# Patient Record
Sex: Male | Born: 1985
Health system: Southern US, Community
[De-identification: ages and names within clinical notes are randomized; demographics above are authoritative.]

## PROBLEM LIST (undated history)

## (undated) DIAGNOSIS — D849 Immunodeficiency, unspecified: Secondary | ICD-10-CM

## (undated) DIAGNOSIS — E119 Type 2 diabetes mellitus without complications: Secondary | ICD-10-CM

## (undated) DIAGNOSIS — I10 Essential (primary) hypertension: Secondary | ICD-10-CM

## (undated) DIAGNOSIS — I639 Cerebral infarction, unspecified: Secondary | ICD-10-CM

## (undated) DIAGNOSIS — Z941 Heart transplant status: Secondary | ICD-10-CM

## (undated) DIAGNOSIS — E785 Hyperlipidemia, unspecified: Secondary | ICD-10-CM

## (undated) DIAGNOSIS — G629 Polyneuropathy, unspecified: Secondary | ICD-10-CM

## (undated) DIAGNOSIS — R569 Unspecified convulsions: Secondary | ICD-10-CM

## (undated) DIAGNOSIS — I428 Other cardiomyopathies: Secondary | ICD-10-CM

## (undated) DIAGNOSIS — I619 Nontraumatic intracerebral hemorrhage, unspecified: Secondary | ICD-10-CM

## (undated) DIAGNOSIS — I5022 Chronic systolic (congestive) heart failure: Secondary | ICD-10-CM

## (undated) HISTORY — PX: OTHER SURGICAL HISTORY: SHX169

---

## 2004-05-15 ENCOUNTER — Emergency Department (HOSPITAL_COMMUNITY): Admission: EM | Admit: 2004-05-15 | Discharge: 2004-05-15 | Payer: Self-pay | Admitting: Emergency Medicine

## 2005-11-13 ENCOUNTER — Observation Stay (HOSPITAL_COMMUNITY): Admission: EM | Admit: 2005-11-13 | Discharge: 2005-11-14 | Payer: Self-pay | Admitting: Emergency Medicine

## 2005-11-13 ENCOUNTER — Ambulatory Visit: Payer: Self-pay | Admitting: Cardiovascular Disease

## 2005-11-25 ENCOUNTER — Ambulatory Visit: Payer: Self-pay | Admitting: Cardiovascular Disease

## 2005-11-25 ENCOUNTER — Encounter: Payer: Self-pay | Admitting: Cardiology

## 2005-11-25 ENCOUNTER — Ambulatory Visit: Payer: Self-pay

## 2006-04-03 ENCOUNTER — Ambulatory Visit: Payer: Self-pay | Admitting: Cardiovascular Disease

## 2006-04-17 ENCOUNTER — Ambulatory Visit (HOSPITAL_COMMUNITY): Admission: RE | Admit: 2006-04-17 | Discharge: 2006-04-17 | Payer: Self-pay | Admitting: Cardiovascular Disease

## 2006-04-17 ENCOUNTER — Ambulatory Visit: Payer: Self-pay | Admitting: Cardiovascular Disease

## 2006-05-02 ENCOUNTER — Ambulatory Visit: Payer: Self-pay | Admitting: Cardiovascular Disease

## 2009-11-12 ENCOUNTER — Emergency Department (HOSPITAL_COMMUNITY): Admission: EM | Admit: 2009-11-12 | Discharge: 2009-11-12 | Payer: Self-pay | Admitting: Emergency Medicine

## 2009-11-15 ENCOUNTER — Encounter (INDEPENDENT_AMBULATORY_CARE_PROVIDER_SITE_OTHER): Payer: Self-pay | Admitting: Internal Medicine

## 2009-11-15 ENCOUNTER — Inpatient Hospital Stay (HOSPITAL_COMMUNITY): Admission: EM | Admit: 2009-11-15 | Discharge: 2009-11-19 | Payer: Self-pay | Admitting: Emergency Medicine

## 2009-11-15 ENCOUNTER — Ambulatory Visit: Payer: Self-pay | Admitting: Surgery

## 2009-11-15 ENCOUNTER — Ambulatory Visit: Payer: Self-pay | Admitting: Cardiovascular Disease

## 2009-11-16 ENCOUNTER — Ambulatory Visit: Payer: Self-pay | Admitting: Cardiovascular Disease

## 2009-11-19 ENCOUNTER — Encounter: Payer: Self-pay | Admitting: Cardiovascular Disease

## 2009-12-07 ENCOUNTER — Ambulatory Visit: Payer: Self-pay | Admitting: Family Medicine

## 2009-12-27 ENCOUNTER — Emergency Department (HOSPITAL_COMMUNITY): Admission: EM | Admit: 2009-12-27 | Discharge: 2009-12-27 | Payer: Self-pay | Admitting: Emergency Medicine

## 2010-01-16 ENCOUNTER — Encounter (INDEPENDENT_AMBULATORY_CARE_PROVIDER_SITE_OTHER): Payer: Self-pay | Admitting: Family Medicine

## 2010-01-16 LAB — CONVERTED CEMR LAB
Calcium: 9.4 mg/dL (ref 8.4–10.5)
Cholesterol: 287 mg/dL — ABNORMAL HIGH (ref 0–200)
HDL: 38 mg/dL — ABNORMAL LOW (ref >39)
Potassium: 4.3 meq/L (ref 3.5–5.3)
Sodium: 141 meq/L (ref 135–145)
Total CHOL/HDL Ratio: 7.6
Triglycerides: 141 mg/dL (ref <150)
VLDL: 28 mg/dL (ref 0–40)

## 2010-01-30 ENCOUNTER — Ambulatory Visit: Payer: Self-pay | Admitting: Family Medicine

## 2010-05-05 ENCOUNTER — Encounter: Payer: Self-pay | Admitting: Cardiovascular Disease

## 2010-05-15 NOTE — Letter (Signed)
Summary: [facility]  [facility]   Imported By: Marylou Mccoy 11/30/2009 14:09:15  _____________________________________________________________________  External Attachment:    Type:   Image     Comment:   External Document

## 2010-06-29 LAB — BASIC METABOLIC PANEL
BUN: 8 mg/dL (ref 6–23)
BUN: 8 mg/dL (ref 6–23)
CO2: 26 mEq/L (ref 19–32)
CO2: 28 mEq/L (ref 19–32)
Calcium: 8.6 mg/dL (ref 8.4–10.5)
Chloride: 101 mEq/L (ref 96–112)
Chloride: 102 mEq/L (ref 96–112)
Chloride: 105 mEq/L (ref 96–112)
Creatinine, Ser: 0.81 mg/dL (ref 0.4–1.5)
Creatinine, Ser: 0.84 mg/dL (ref 0.4–1.5)
GFR calc Af Amer: >60 mL/min (ref >60)
GFR calc Af Amer: >60 mL/min (ref >60)
GFR calc Af Amer: >60 mL/min (ref >60)
GFR calc non Af Amer: >60 mL/min (ref >60)
GFR calc non Af Amer: >60 mL/min (ref >60)
Glucose, Bld: 226 mg/dL — ABNORMAL HIGH (ref 70–99)
Glucose, Bld: 346 mg/dL — ABNORMAL HIGH (ref 70–99)
Potassium: 3.3 mEq/L — ABNORMAL LOW (ref 3.5–5.1)
Potassium: 3.6 mEq/L (ref 3.5–5.1)
Potassium: 3.7 mEq/L (ref 3.5–5.1)
Potassium: 4 mEq/L (ref 3.5–5.1)
Sodium: 137 mEq/L (ref 135–145)
Sodium: 137 mEq/L (ref 135–145)
Sodium: 138 mEq/L (ref 135–145)
Sodium: 145 mEq/L (ref 135–145)

## 2010-06-29 LAB — GLUCOSE, CAPILLARY
Glucose-Capillary: 143 mg/dL — ABNORMAL HIGH (ref 70–99)
Glucose-Capillary: 196 mg/dL — ABNORMAL HIGH (ref 70–99)
Glucose-Capillary: 205 mg/dL — ABNORMAL HIGH (ref 70–99)
Glucose-Capillary: 232 mg/dL — ABNORMAL HIGH (ref 70–99)
Glucose-Capillary: 239 mg/dL — ABNORMAL HIGH (ref 70–99)
Glucose-Capillary: 268 mg/dL — ABNORMAL HIGH (ref 70–99)
Glucose-Capillary: 288 mg/dL — ABNORMAL HIGH (ref 70–99)
Glucose-Capillary: 291 mg/dL — ABNORMAL HIGH (ref 70–99)
Glucose-Capillary: 323 mg/dL — ABNORMAL HIGH (ref 70–99)
Glucose-Capillary: 337 mg/dL — ABNORMAL HIGH (ref 70–99)
Glucose-Capillary: 338 mg/dL — ABNORMAL HIGH (ref 70–99)
Glucose-Capillary: 343 mg/dL — ABNORMAL HIGH (ref 70–99)
Glucose-Capillary: 398 mg/dL — ABNORMAL HIGH (ref 70–99)

## 2010-06-29 LAB — CARDIAC PANEL(CRET KIN+CKTOT+MB+TROPI)
CK, MB: 2.1 ng/mL (ref 0.3–4.0)
CK, MB: 2.2 ng/mL (ref 0.3–4.0)
Total CK: 370 U/L — ABNORMAL HIGH (ref 7–232)
Troponin I: 0.02 ng/mL (ref 0.00–0.06)

## 2010-06-29 LAB — DIFFERENTIAL
Basophils Absolute: 0 10*3/uL (ref 0.0–0.1)
Eosinophils Absolute: 0.1 10*3/uL (ref 0.0–0.7)
Eosinophils Relative: 1 % (ref 0–5)
Lymphocytes Relative: 47 % — ABNORMAL HIGH (ref 12–46)
Monocytes Absolute: 0.4 10*3/uL (ref 0.1–1.0)

## 2010-06-29 LAB — URINALYSIS, ROUTINE W REFLEX MICROSCOPIC
Bilirubin Urine: NEGATIVE
Glucose, UA: >1000 mg/dL — AB
Ketones, ur: 15 mg/dL — AB
Nitrite: NEGATIVE
Nitrite: NEGATIVE
Protein, ur: NEGATIVE mg/dL
Protein, ur: NEGATIVE mg/dL
Specific Gravity, Urine: 1.043 — ABNORMAL HIGH (ref 1.005–1.030)
Urobilinogen, UA: 0.2 mg/dL (ref 0.0–1.0)
pH: 5 (ref 5.0–8.0)

## 2010-06-29 LAB — URINE MICROSCOPIC-ADD ON

## 2010-06-29 LAB — LIPID PANEL
HDL: 49 mg/dL (ref >39)
Triglycerides: 749 mg/dL — ABNORMAL HIGH (ref <150)
VLDL: UNDETERMINED mg/dL (ref 0–40)

## 2010-06-29 LAB — CBC
HCT: 42.2 % (ref 39.0–52.0)
HCT: 45.1 % (ref 39.0–52.0)
Hemoglobin: 14.7 g/dL (ref 13.0–17.0)
MCH: 29 pg (ref 26.0–34.0)
MCH: 29.7 pg (ref 26.0–34.0)
MCHC: 34.8 g/dL (ref 30.0–36.0)
MCHC: 35.9 g/dL (ref 30.0–36.0)
MCV: 82.8 fL (ref 78.0–100.0)
MCV: 83.2 fL (ref 78.0–100.0)
Platelets: 186 10*3/uL (ref 150–400)
RDW: 11.6 % (ref 11.5–15.5)
WBC: 5.5 10*3/uL (ref 4.0–10.5)

## 2010-06-29 LAB — HEMOGLOBIN A1C: Mean Plasma Glucose: 298 mg/dL — ABNORMAL HIGH (ref <117)

## 2010-06-29 LAB — POCT I-STAT, CHEM 8
BUN: 19 mg/dL (ref 6–23)
Calcium, Ion: 1.14 mmol/L (ref 1.12–1.32)
Hemoglobin: 16.3 g/dL (ref 13.0–17.0)
TCO2: 25 mmol/L (ref 0–100)

## 2010-06-29 LAB — RAPID URINE DRUG SCREEN, HOSP PERFORMED
Benzodiazepines: NOT DETECTED
Cocaine: NOT DETECTED
Tetrahydrocannabinol: NOT DETECTED

## 2010-06-29 LAB — C-PEPTIDE: C-Peptide: 1.85 ng/mL (ref 0.80–3.90)

## 2010-06-29 LAB — GLUCOSE, RANDOM: Glucose, Bld: 373 mg/dL — ABNORMAL HIGH (ref 70–99)

## 2010-08-31 NOTE — H&P (Signed)
Jason Stevenson, Jason NO.:  [id number]   MEDICAL RECORD NO.:  [id number]          PATIENT TYPE:  EMS   LOCATION:  MAJO                         FACILITY:  [facility]   PHYSICIAN:  Charlton Haws, M.D.     DATE OF BIRTH:  1985-10-14   DATE OF ADMISSION:  11/13/2005  DATE OF DISCHARGE:                                HISTORY & PHYSICAL   PRIMARY CARDIOLOGIST:  He is new and being seen by Dr. Eden Emms.   PRIMARY CARE PHYSICIAN:  He does not have one.   PATIENT PROFILE:  A 25 year old African-American male who presents with  chest pain and EKG changes.   PROBLEM LIST:  1.  Chest pain.  2.  Abnormal ECG.  3.  Hyperlipidemia, diagnosed at age 27.  4.  Hypertension, diagnosed at age 37.  5.  Medical nonadherence.   HISTORY OF PRESENT ILLNESS:  A 25 year old African-American male with no  prior of CAD, who was in his usual state of health until approximately 7:00  p.m. last night when he woke up from napping with 4/10 substernal chest  pressure and grabbing sensation without associated symptoms.  The symptoms  persisted throughout the night and all day today.  There are no aggravating  or alleviating factors.  Due to the persistence of symptoms, presented to  the ED this evening.  An ECG was performed revealing anterior ST elevation  as well as inferolateral ST segment depression and T wave inversion.  A code  STEMI was initially called; however, upon further review of the patient's  old EKG in January, 2006, it was determined that his ECG now is unchanged  from previous.   He continues to complain of 4/10 chest pain but otherwise appears  comfortable.  He denies any PND, orthopnea, dizziness, syncope, edema, or  early satiety.   ALLERGIES:  No known drug allergies.   HOME MEDICATIONS:  None, although he reports that he was prescribed Lipitor  and a blood pressure medicine at age 10.   FAMILY HISTORY:  Mother is alive at age 50 with diabetes.  He does not know  much  about his father's history.  He has a sister who had a CVA at age 54.   SOCIAL HISTORY:  He lives in [place] by himself.  He works [employer].  He denies any tobacco, alcohol or drugs.  He does not  routinely exercise.   REVIEW OF SYSTEMS:  Positive for chest pain, as outlined in the HPI.  All  other systems are reviewed and are negative.   PHYSICAL EXAMINATION:  VITAL SIGNS:  He is afebrile.  Heart rate 65,  respirations 16, blood pressure 147/84.  Pulse ox 100% on 2 liters per  minute.  GENERAL:  A pleasant African-American male in no acute distress.  Acute  onset of x3.  NECK:  Normal carotid upstrokes.  No bruits or JVD.  LUNGS:  Respirations are regular and unlabored.  Clear to auscultation.  CARDIAC:  Regular S1 and S2.  No S3, S4, or murmurs.  ABDOMEN:  Round, soft, nontender, nondistended.  Bowel sounds present x4.  EXTREMITIES:  Warm, dry, pink.  No clubbing, cyanosis or edema.  Dorsalis  pedis and posterior tibial pulses are 2+ and equal bilaterally.   Chest x-ray is pending.   EKG shows sinus rhythm with anterior 1-2 mm ST segment elevation,  specifically in V1-3 with ST depression and T wave inversion in leads II,  III, aVF, and V4-6.   Lab work is pending.   ASSESSMENT/PLAN:  1.  Chest pain:  He has an abnormal electrocardiogram but this is unchanged      compared to previous in January, 2006.  We will plan to admit, cycle      cardiac markers, and call off the code STEMI.  If he rules out, probable      cardiac CT tomorrow afternoon.  We will also obtain electrocardiogram to      evaluate wall motion, rule out valve disease or abnormality.  2.  History of hypertension and hyperlipidemia:  Continue to follow blood      pressures, which is currently elevated, although it was lower      previously.  Check lipids and LFTs.      Ok Anis, NP    ______________________________  Charlton Haws, M.D.    CRB/MEDQ  D:  11/13/2005  T:   11/13/2005  Job:  [id number]

## 2010-08-31 NOTE — Assessment & Plan Note (Signed)
[facility]                            CARDIOLOGY OFFICE NOTE   [name], [name]                    MRN:          [id number]  DATE:05/02/2006                            DOB:          07/03/1985    HISTORY OF PRESENT ILLNESS:  Jason Stevenson returns for follow-up. I initially  saw him in the hospital with atypical chest pain. He was found to have a  cardiomyopathy.   His MRI showed that he has a nonischemic cardiomyopathy with no evidence  of scar tissue. His EF was calculated at 45%.   However visually it appeared a little bit lower than that. I had a long  discussion with Jason Stevenson today about compliance. He still seems a little  bit evasive. Initially when I met him he was going to school and  working. Now he is not working and he is a little bit vague about what  he is doing at school. Fortunately he has not had any significant PND,  orthopnea. There has been no evidence of syncope and he is able to sleep  through the night without shortness of breath. There is no family  history of cardiomyopathy.   I explained to him previously that he needed to watch his salt in his  diet. Currently we only have him on a baby aspirin and Lisinopril 10 mg  a day and with this his blood pressure is fine at 120/70, his pulse is  68 and regular.  HEENT: Normal.  NECK: There is no lymphadenopathy, no thyromegaly.  LUNGS: Clear.  HEART: There is an S1, S2 with a very short systolic murmur.  ABDOMEN: Benign.  LOWER EXTREMITIES:  Intact pulses, no edema.   IMPRESSION:  1. Blood pressure controlled on Lisinopril.  2. Cardiomyopathy, nonischemic.   The patient has some difficulty affording his medications. We have him  on generic Lisinopril 10 a day. I do not think he needs to be on Coreg  or multi-drug therapy at this time. He is functional class I.  I would  like to follow-up his EF in about a year with an echo. He will call me  if he has any new symptoms.   I  told him that his current level of function he should not have any  physical limitations. His EF is not low enough to require EP  consultation. He is currently not volume overloaded and I will see him  back in 6 months. At that time we will check a BMET and a BNP since he  is on an ACE inhibitor with decreased LV function.    Jason Stevenson. Jason Emms, MD, [facility]  Electronically Signed   PCN/MedQ  DD: 05/02/2006  DT: 05/02/2006  Job #: [id number]

## 2010-08-31 NOTE — Assessment & Plan Note (Signed)
[facility]                            CARDIOLOGY OFFICE NOTE   Jason Stevenson, Jason Stevenson                    MRN:          [id number]  DATE:04/03/2006                            DOB:          1985/09/11    [name] returns today for followup.  He has been seen at the  hospital before for atypical chest pain.   He had a cardiac CT which was somewhat suboptimal, but did not suggest  coronary disease with a negative calcium score.  His echocardiogram done  August 13 showed global hypokinesis with an EF of 40%.  When we  initially saw the patient he had CPKs of 1000 with an MB of 12.5 and a  troponin of 0.05.   I thought these were noncardiac in nature.  The patient continues to  have atypical chest pain and sharp stabbing pains in his chest.  There  has been no evidence of myocarditis or pericarditis.  He was supposed to  start Lisinopril 10 mg a day and never did.  He was supposed to show up  at the hospital for a cardiac MRI to assess this quantitatively, assess  his LV function and rule out myocarditis or scar tissue, as well as a  possible infiltrative cardiomyopathy and he did not show up.  I talked  to [place] frankly, I am not quite sure what to make of him.  He is  supposed to be going to school at [employer] and also  working at [employer].  His main agenda  today in the office was to get a note for missing work.  I told him I  could not give him this since I did not know why he missed work the last  4 days.  In regards to his symptoms, he is not having shortness of  breath, syncope, PND, or orthopnea, he is having some atypical chest  pain.   He is supposed to be on Prilosec OTC, baby aspirin a day, and Lisinopril  10 a day.   Today the blood pressure is 140/88.  HEENT:  Is normal.  Carotids are normal.  LUNGS:  Are clear.  There is an S1, S2 with normal heart sounds.  ABDOMEN:  Is  benign.  LOWER EXTREMITIES:  Intact pulse, no edema.  NEURO:  Is nonfocal.  SKIN:  Is warm and dry.   IMPRESSION:  Probable cardiomyopathy of undefined etiology.  Followup  cardiac MRI to quantitatively assess ejection fraction and rule out  infiltrative cardiomyopathy or myocarditis.  Atypical chest pain.  Continue with nonsteroidal treatment, blood pressure control and after-  load reduction with Lisinopril 10 mg a day.  I did gave him samples of  Altace today in case he did not want to get his prescription filled.   I will see him back in about 4 weeks to assess his compliance, review  his MRI and see if he is continuing to have chest pain.   I frankly told [name] that if he was unwilling to keep his appointments  for diagnostic tests or take his medicine it would  be difficult to care  for him in the future.     Noralyn Pick. Eden Emms, MD, [facility]  Electronically Signed    PCN/MedQ  DD: 04/03/2006  DT: 04/04/2006  Job #: [id number]

## 2010-08-31 NOTE — Assessment & Plan Note (Signed)
[facility]                              CARDIOLOGY OFFICE NOTE   LENNIN, OSMOND                    MRN:          [id number]  DATE:11/25/2005                            DOB:          1985-05-28    Mr. Kathan is seen today in followup.  He was in the hospital for 24  hours observation on November 13, 2005.  He had chest pain.  He has abnormal  EKG at baseline.  We had an old EKG from over a year ago which was  identical.  He had diffuse T-wave inversions and an incomplete right bundle  branch block.  There was J-point elevation in the anterior leads.  His CPKs  were elevated, but his MBs were less than 2%.  His troponins were negative  at .05 and .07.   It was not clear as to why he had a non-cardiac elevation in his CPK.  There  were no acute EKG changes despite his baseline abnormality.  His pain was  atypical in nature and non-exertional.  It responded to Motrin and pain  medicines more.  However, today he showed up in our office for followup.  He  was to have a stress echo.  His baseline images which I have not reviewed  yet showed an EF of 35% per the echo tech.  I told the tech to bring him  over to the exam room.  In talking to the patient, he really does not have  any significant pains.  He has musculoskeletal pains in his shoulders.   He is not experiencing shortness of breath, PND, orthopnea or syncope.   He does not know his father's side of the family very well, but there is no  family history of cardiomyopathy.  There is a questionable history of  hypertension for four years that is untreated.   EXAMINATION:  VITAL SIGNS:  Blood pressure is 140/88, pulse is 70 and  regular.  LUNGS:  Clear.  NECK:  Carotids are normal.  CARDIOVASCULAR:  There is an S1, S2.  I do not hear a rub, murmur, gallop or  click.  ABDOMEN:  Benign.  EXTREMITIES:  Intact pulse, no edema.   IMPRESSION:  The patient appears to have a cardiomyopathy.   I do not think  it is ischemic in nature; however, we will start on the lisinopril 3 mg a  day.  He had a cardiac CT in the hospital, which despite using care bolus  with a region of interest in the aorta, the cardiac CT was poorly timed, and  essentially was a pulmonary angiogram.  What images we could see of the  coronaries, there clearly did not appear to be a total LAD lesion.   I would like to avoid invasive heart catheterization on the patient.   In terms of his LV function and natural history, I think the best followup  test would be a cardiac MRI.  We can quantitate his LV function and assess  for scar.   If he has a non-ischemic cardiomyopathy, this should clearly show up on  hyper-enhancement imaging as  being negative.  I think it is important to  also quantitate his EF.  I would like him on an Ace inhibitor for at least 8  weeks before we do the study.  I will start him on lisinopril 10 mg,  and he will have his cardiac MRI in about 8 to 10 weeks.  Further  recommendations will be based on the results of this study and his response  to Ace inhibitor.                               Noralyn Pick. Eden Emms, MD, [facility]    PCN/MedQ  DD:  11/25/2005  DT:  11/25/2005  Job #:  [id number]

## 2010-08-31 NOTE — Discharge Summary (Signed)
Jason Stevenson, Jason Stevenson NO.:  [id number]   MEDICAL RECORD NO.:  [id number]          PATIENT TYPE:  INP   LOCATION:  2008                         FACILITY:  [facility]   PHYSICIAN:  Charlton Haws, M.D.     DATE OF BIRTH:  Nov 17, 1985   DATE OF ADMISSION:  11/13/2005  DATE OF DISCHARGE:  11/14/2005                                 DISCHARGE SUMMARY   REASON FOR ADMISSION:  Chest pain.   DISCHARGE DIAGNOSES:  1.  Chest pain, etiology unclear.  2.  Untreated hypercholesterolemia.  3.  History of hypertension -- no current therapy.   HISTORY OF PRESENT ILLNESS:  Jason Stevenson is a 25 year old male patient  who presented to the emergency room with 4/10 substernal chest pain that he  describes as a grabbing sensation without associated symptoms.  These  symptoms persisted all day, prompting him to go to the emergency room.  Initially there was some concern about his EKG representing an acute  myocardial infarction.  However, this was compared to old EKGs and there had  been no change.  He was admitted for further evaluation and treatment.   HOSPITAL COURSE:  The patient's total CPK returned elevated at 1,079, and at  discharge it was 717.  His CK-MB's were only minimally elevated, and his  indices were normal.  His troponin's were nonspecifically elevated.  This  was not felt to be consistent with myocardial injury.  The patient also had  a D-dimer that was elevated at 1.05.  This was not felt to be significant by  Dr. Eden Emms.  Therefore, it was felt that the patient ruled out for a  myocardial infarction; and he was set up for an inpatient cardiac CT scan to  rule out the possibility of coronary disease.  Unfortunately, the cardiac CT  scan was not performed correctly.  Dr. Eden Emms felt that the patient was  stable enough for discharge to home.  He recommended an outpatient stress  echocardiogram and follow-up with Dr. Eden Emms.   LABORATORY INVESTIGATIONS:  White count  5,700, hemoglobin 15.6, hematocrit  46.9, platelet count 233,000.  INR 1.0, D-dimer 1.05, sodium 141, potassium  3.8, chloride 104, bicarbonate 29, glucose 81, BUN 10, creatinine 0.9, total  bilirubin 0.7, alkaline phosphatase 61, AST 36, ALT 22, total protein 7.5,  albumin 4.1, calcium 9.6.   Cardiac markers:  1.  CK 1,079, MB 12.5, troponin I of 0.05.  2.  CK 848, MB 10.4, troponin I of 0.07.  3.  CK 717, MB 10, troponin I of 0.07.   Total cholesterol 288, triglycerides 239, HDL 50, LDL 190, TSH 0.817.   Chest x-ray with no acute chest findings.   MEDICATIONS ON DISCHARGE:  Coated aspirin 81 mg daily; Protonix 40 mg daily.   DISCHARGE DIET:  Low fat, low sodium, low cholesterol diet.   DISCHARGE ACTIVITIES:  Increase as tolerated.  Wound care not applicable.   FOLLOW-UP:  The patient will be set up for a stress echocardiogram and  follow up with Dr. Eden Emms on the same day in the next 1-2 weeks.   The patient has been asked  to establish himself with a primary care  physician.  He needs to maintain a low cholesterol diet; and have his lipids  and LFTs rechecked in about 3-6 months.  If his LDL remains above 190,  therapy should be considered.   DICTATION NOTE:  Total physician P.A. time greater than 30 minutes.      Jason Stevenson, P.A.    ______________________________  Charlton Haws, M.D.    SW/MEDQ  D:  11/14/2005  T:  11/15/2005  Job:  [id number]

## 2012-11-05 ENCOUNTER — Encounter (HOSPITAL_COMMUNITY): Payer: Self-pay | Admitting: Family Medicine

## 2012-11-05 ENCOUNTER — Emergency Department (HOSPITAL_COMMUNITY): Payer: Self-pay

## 2012-11-05 ENCOUNTER — Observation Stay (HOSPITAL_COMMUNITY)
Admission: EM | Admit: 2012-11-05 | Discharge: 2012-11-06 | Disposition: A | Discharge status: Home / Self Care | Payer: MEDICAID | Attending: Family Medicine | Admitting: Family Medicine

## 2012-11-05 DIAGNOSIS — E781 Pure hyperglyceridemia: Secondary | ICD-10-CM

## 2012-11-05 DIAGNOSIS — E785 Hyperlipidemia, unspecified: Secondary | ICD-10-CM

## 2012-11-05 DIAGNOSIS — R079 Chest pain, unspecified: Principal | ICD-10-CM | POA: Insufficient documentation

## 2012-11-05 DIAGNOSIS — I10 Essential (primary) hypertension: Secondary | ICD-10-CM

## 2012-11-05 DIAGNOSIS — I428 Other cardiomyopathies: Secondary | ICD-10-CM

## 2012-11-05 DIAGNOSIS — Z9119 Patient's noncompliance with other medical treatment and regimen: Secondary | ICD-10-CM | POA: Insufficient documentation

## 2012-11-05 DIAGNOSIS — R739 Hyperglycemia, unspecified: Secondary | ICD-10-CM

## 2012-11-05 DIAGNOSIS — Z91199 Patient's noncompliance with other medical treatment and regimen due to unspecified reason: Secondary | ICD-10-CM | POA: Insufficient documentation

## 2012-11-05 DIAGNOSIS — R9431 Abnormal electrocardiogram [ECG] [EKG]: Secondary | ICD-10-CM | POA: Insufficient documentation

## 2012-11-05 DIAGNOSIS — Z794 Long term (current) use of insulin: Secondary | ICD-10-CM | POA: Insufficient documentation

## 2012-11-05 DIAGNOSIS — E109 Type 1 diabetes mellitus without complications: Secondary | ICD-10-CM | POA: Insufficient documentation

## 2012-11-05 DIAGNOSIS — Z79899 Other long term (current) drug therapy: Secondary | ICD-10-CM | POA: Insufficient documentation

## 2012-11-05 HISTORY — DX: Other cardiomyopathies: I42.8

## 2012-11-05 HISTORY — DX: Essential (primary) hypertension: I10

## 2012-11-05 HISTORY — DX: Type 2 diabetes mellitus without complications: E11.9

## 2012-11-05 HISTORY — DX: Hyperlipidemia, unspecified: E78.5

## 2012-11-05 LAB — POCT I-STAT TROPONIN I: Troponin i, poc: 0 ng/mL (ref 0.00–0.08)

## 2012-11-05 LAB — CBC
MCV: 82.9 fL (ref 78.0–100.0)
Platelets: 235 10*3/uL (ref 150–400)
RBC: 5.39 MIL/uL (ref 4.22–5.81)
RDW: 12 % (ref 11.5–15.5)
WBC: 4.2 10*3/uL (ref 4.0–10.5)

## 2012-11-05 LAB — BASIC METABOLIC PANEL
CO2: 28 mEq/L (ref 19–32)
Chloride: 101 mEq/L (ref 96–112)
Creatinine, Ser: 0.86 mg/dL (ref 0.50–1.35)
GFR calc Af Amer: >90 mL/min (ref >90)
Potassium: 3.9 mEq/L (ref 3.5–5.1)
Sodium: 139 mEq/L (ref 135–145)

## 2012-11-05 LAB — URINALYSIS, ROUTINE W REFLEX MICROSCOPIC
Glucose, UA: 500 mg/dL — AB
Hgb urine dipstick: NEGATIVE
Leukocytes, UA: NEGATIVE
Protein, ur: 30 mg/dL — AB
Specific Gravity, Urine: 1.042 — ABNORMAL HIGH (ref 1.005–1.030)

## 2012-11-05 LAB — TROPONIN I
Troponin I: <0.3 ng/mL (ref <0.30)
Troponin I: <0.3 ng/mL (ref <0.30)

## 2012-11-05 LAB — RAPID URINE DRUG SCREEN, HOSP PERFORMED
Cocaine: NOT DETECTED
Opiates: NOT DETECTED
Tetrahydrocannabinol: NOT DETECTED

## 2012-11-05 LAB — URINE MICROSCOPIC-ADD ON

## 2012-11-05 MED ORDER — INSULIN GLARGINE 100 UNIT/ML ~~LOC~~ SOLN
25.0000 [IU] | Freq: Every day | SUBCUTANEOUS | Status: DC
  Administered 2012-11-05: 25 [IU] via SUBCUTANEOUS
  Filled 2012-11-05 (×2): qty 0.25

## 2012-11-05 MED ORDER — CARVEDILOL 3.125 MG PO TABS
3.1250 mg | ORAL_TABLET | Freq: Two times a day (BID) | ORAL | Status: DC
  Filled 2012-11-05 (×3): qty 1

## 2012-11-05 MED ORDER — ASPIRIN 81 MG PO CHEW
81.0000 mg | CHEWABLE_TABLET | Freq: Every day | ORAL | Status: DC
Start: 2012-11-06 — End: 2012-11-06
  Administered 2012-11-06: 81 mg via ORAL
  Filled 2012-11-05: qty 1

## 2012-11-05 MED ORDER — ONDANSETRON HCL 4 MG/2ML IJ SOLN: 4.0000 mg | Freq: Four times a day (QID) | INTRAMUSCULAR | Status: DC | PRN

## 2012-11-05 MED ORDER — INSULIN ASPART 100 UNIT/ML ~~LOC~~ SOLN
0.0000 [IU] | Freq: Three times a day (TID) | SUBCUTANEOUS | Status: DC
  Administered 2012-11-05: 2 [IU] via SUBCUTANEOUS
  Administered 2012-11-06: 5 [IU] via SUBCUTANEOUS
  Administered 2012-11-06: 3 [IU] via SUBCUTANEOUS

## 2012-11-05 MED ORDER — SIMVASTATIN 20 MG PO TABS
20.0000 mg | ORAL_TABLET | Freq: Every day | ORAL | Status: DC
  Administered 2012-11-05: 20 mg via ORAL
  Filled 2012-11-05 (×2): qty 1

## 2012-11-05 MED ORDER — ACETAMINOPHEN 325 MG PO TABS: 650.0000 mg | ORAL_TABLET | ORAL | Status: DC | PRN

## 2012-11-05 MED ORDER — ENOXAPARIN SODIUM 40 MG/0.4ML ~~LOC~~ SOLN
40.0000 mg | SUBCUTANEOUS | Status: DC
  Administered 2012-11-05: 40 mg via SUBCUTANEOUS
  Filled 2012-11-05 (×2): qty 0.4

## 2012-11-05 MED ORDER — ASPIRIN 81 MG PO CHEW
324.0000 mg | CHEWABLE_TABLET | Freq: Once | ORAL | Status: AC
  Administered 2012-11-05: 324 mg via ORAL
  Filled 2012-11-05: qty 4

## 2012-11-05 MED ORDER — SIMVASTATIN 20 MG PO TABS: 20.0000 mg | ORAL_TABLET | Freq: Every day | ORAL | Status: DC

## 2012-11-05 MED ORDER — LISINOPRIL 2.5 MG PO TABS
2.5000 mg | ORAL_TABLET | Freq: Every day | ORAL | Status: DC
  Administered 2012-11-05 – 2012-11-06 (×2): 2.5 mg via ORAL
  Filled 2012-11-05 (×2): qty 1

## 2012-11-05 MED ORDER — MORPHINE SULFATE 2 MG/ML IJ SOLN
2.0000 mg | INTRAMUSCULAR | Status: DC | PRN
  Administered 2012-11-05: 2 mg via INTRAVENOUS
  Filled 2012-11-05: qty 2

## 2012-11-05 NOTE — ED Notes (Signed)
Dr Casper Harrison at bedside

## 2012-11-05 NOTE — ED Notes (Signed)
Pt states "it's not really pain, it is tightness or pressure." Pt was asked if the pain/tightness is better than before the morphine was given. Pt states "not really." Requested cardiology to be paged.

## 2012-11-05 NOTE — ED Notes (Signed)
Family practice at bedside. Pt alert and mentating appropriately.

## 2012-11-05 NOTE — H&P (Signed)
Family Medicine Teaching [facility] Admission History and Physical Service Pager: [phone]  Patient name: Jason Stevenson Medical record number: [id number] Date of birth: [birth year] Age: 27 y.o. Gender: male  Primary Care Provider: No PCP Per Patient Consultants: Cardiology Code Status: Full Code  Chief Complaint: Chest pain  Assessment and Plan: Jason Stevenson is a 27 y.o. year old male presenting with chest pain . PMH is significant for hypertension, hyperlipidemia, diabetes mellitus type 1 and non-ischemic cardiomyopathy  1. Chest pain - Low likelihood to be cardiac etiology, constant, substernal chest pain since today. Recurring chest pain for months but worsened. No radiation to neck, jaw or arm. No history of MI. Currently does not take medication for hypertension or hyperlipidemia because of cost. Could be ACS vs costochondritis, vs pericarditis. - previous echo with EF 20-25% - Cardiology consult - appreciate recommendations - Risk stratification labs:  - A1C  - TSH  - Lipid panel (AM fasting) - Repeat AM EKG to monitor for changes - Admit to telemetry to monitor heart rate and rhythm [ ]  f/u A1C [ ]  f/u TSH [ ]  f/u Lipid panel [ ]  f/u AM EKG   2. Diabetes mellitus type 1 - currently uncontrolled at home. Is not fully compliant with insulin. Currently scheduled to take lantus 25 units BID with sliding scale novolog. He has trouble affording medication. His last A1C: 12.0 (11/15/09).  - start lantus 25 qhs. Decrease from home dose because of limited history of hypoglycemic events. Will adjust dose as needed - start sensitive sliding scale insulin for food coverage. Will use daily short acting dose to adjust lantus dose [ ]  f/u AM A1C [ ]  f/u CBGs  3. Non-ischemic cardiomyopathy - previous echo showed EF of 20-25%. No signs or history of CHF. 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 - Cardiology  Consult- appreciate recommendations  - Lisinopril 2.5 qd and coreg 3.25 BID - echocardiogram to assess EF and structure of heart [ ]  f/u echocardiogram results  4. Hyperlipidemia - history of high triglycerides (749 in 11/15/09). Choesterol: 287 on 01/16/10. Currently not taking any medication. - consider gemfibrozil for increased triglycerides and statin for increased cholesterol but as he states he has lost significant weight we will await fasting lab results to start  FEN/GI: Heart healthy Prophylaxis: lovenox  Disposition: Admit to telemetry cardiac chest pain observation unit  History of Present Illness: Jason Stevenson is a 27 y.o. year old male presenting with chest pain and increased glucose. Tight feeling substernal, no radiation, constant today, alleviated with laying on side no aggravating factors. Past experience yesterday and recurrently since age 42 and worse today. No dizziness, headache, nausea/vomiting, no shortness of breath associated with his chest pain. Currently supposed to be on medication but haven't taken medication for over a year. CBGs have been in 200-300s. Highest was "high" on meter. Lowert was 112. He occasionally has suspected hypoglycemic episodes characterized by sweating and shaking.  He complains of recent Dizziness, low energy. Lantus 25 units BID at home. Usually takes 5-15 units novolog. Uses sliding scale at home. He misses insulin doses on a daily basis. No dysuria. No penile discharge. No constipation, diarrhea, sore throat, rhinorrhea. +vision changes after eating occasionally. +urgency, no polyuria, no polydipsia. Lives at home by himself. No orthopnea, or paroxysmal dypnea.  Received morphine with little improvement. No nitro  No smoking hx, +marijuana, no cocaine, alcohol 1x year  Review Of Systems: Per HPI with the following  additions:  Otherwise 12 point review of systems was performed and was unremarkable.  Patient Active Problem List   Diagnosis  Date Noted  . Chest pain 11/05/2012   Past Medical History: Past Medical History  Diagnosis Date  . Diabetes mellitus without complication   . Hypertension   . Nonischemic cardiomyopathy Noted as early as 2007    Per chart review (cards consult note 2011), EF of 40% in 2007, down to 20-25% in 2011  . Hyperlipidemia    Past Surgical History: Past Surgical History  Procedure Laterality Date  . None     Social History: History  Substance Use Topics  . Smoking status: Never Smoker   . Smokeless tobacco: Not on file  . Alcohol Use: Yes     Comment: "occasional" when "hanging out with the wrong people" No recent use.   Additional social history:  Has had marijuana use in the past but does not currently use. No other illicit drug use history. Please also refer to relevant sections of EMR.  Family History: Family History  Problem Relation Age of Onset  . Diabetes Mother    Allergies and Medications: No Known Allergies No current facility-administered medications on file prior to encounter.   No current outpatient prescriptions on file prior to encounter.    Objective: BP 123/82  Pulse 71  Temp(Src) 98.3 F (36.8 C)  Resp 20  SpO2 97% Exam: General: Laying in the stretcher, comfortable, in no acute distress HEENT: Moist mucous membranes, PERRL, EOMI,  Cardiovascular: RRR, no murmurs Respiratory: Clear to auscultation bilaterally, decreased air movement bilaterally Abdomen: soft, non-tender, non-distended, normal bowel sounds Extremities: no edema, no foot wounds Skin: dry, no cyanosis Neuro: CN 2-12 intact. Alert & oriented x3. Having regular conversation. No focal deficits. Strength 5/5 and sensation intact in all four extremities  Labs and Imaging: CBC BMET   Recent Labs Lab 11/05/12 1308  WBC 4.2  HGB 15.7  HCT 44.7  PLT 235    Recent Labs Lab 11/05/12 1308  NA 139  K 3.9  CL 101  CO2 28  BUN 13  CREATININE 0.86  GLUCOSE 261*  CALCIUM 9.4      Troponin negative x1  Chest X-ray (7/24) Findings: The heart and pulmonary vascularity are within normal  limits. The lungs are well-aerated bilaterally. No acute bony  abnormality is seen.  IMPRESSION:  No acute abnormality noted.  EKG (7/14) Regular rate, regular rhythm, Inverted T-waves in leads II, III, AvF, V4-V6 (inferolateral ischemia?), with right axis deviation  Jacquelin Hawking, MD 11/05/2012, 5:41 PM PGY-1, [facility] FPTS Intern pager: [phone], text pages welcome  I have seen this patient with Dr. Caleb Popp and agree with his documentation as above with my editing being in [facility].   Murtis Sink, MD [facility] Family Medicine Resident, PGY-2 FPTS Intern pager: [phone], text pages welcome 11/05/2012, 7:38 PM

## 2012-11-05 NOTE — Consult Note (Signed)
CARDIOLOGY CONSULT NOTE   Patient ID: Jason Stevenson MRN: [id number] DOB/AGE: [birth year] 29 y.o.  Admit date: 11/05/2012  Primary Physician   No PCP Per Patient Primary Cardiologist   PN Reason for Consultation   Chest pain  YQM:VHQION [name] is a 27 y.o. male with no history of CAD.  He has a history of NICM, EF 25% in 2011. Pt had onset chest pain Monday, 7/21. It was sharp and went across his chest. It recurred, but would resolve spontaneously in about 30 minutes. On Wednesday, the pain was sharper but shorter in duration. Today he had onset of chest tightness, substernal at a 5/10. It was not associated with SOB, N&V or diaphoresis. The pain started about 11:00 am. He has not been SOB or had edema. He has occasional episodes with SOB but today is a good day. He came to the ER because the pain was prolonged and he knows he needs treatment for other medical issues.    Past Medical History  Diagnosis Date  . Diabetes mellitus without complication   . Hypertension   . Nonischemic cardiomyopathy Noted as early as 2007    Per chart review (cards consult note 2011), EF of 40% in 2007, down to 20-25% in 2011  . Hyperlipidemia      Past Surgical History  Procedure Laterality Date  . None      No Known Allergies  I have reviewed the patient's current medications Prior to Admission medications   Medication Sig Authorizing Provider  ibuprofen (ADVIL,MOTRIN) 200 MG tablet Take 200 mg by mouth every 6 (six) hours as needed for pain.    insulin aspart (NOVOLOG) 100 UNIT/ML injection Inject 10-50 Units into the skin 3 (three) times daily with meals.  Was getting from [facility], then got thru a church, but is almost out.  insulin glargine (LANTUS) 100 UNIT/ML injection Inject 25 Units into the skin 2 (two) times daily. Was getting from [facility], then got thru a church, but is almost out.     History   Social History  . Marital Status: Single    Spouse Name: N/A    Number  of Children: N/A  . Years of Education: N/A   Occupational History  . Mortgage [employer]    Social History Main Topics  . Smoking status: Never Smoker   . Smokeless tobacco: Not on file  . Alcohol Use: Yes     Comment: "occasional" when "hanging out with the wrong people" No recent use.  . Drug Use: Yes    Special: Marijuana     Comment: Last in 2013  . Sexually Active: Not on file   Other Topics Concern  . Not on file   Social History Narrative   Pt lives alone. Has no information on father.    Family Status  Relation Status Death Age  . Mother Alive     No CAD  . Brother Deceased     Hx DM   Family History  Problem Relation Age of Onset  . Diabetes Mother      ROS:  Full 14 point review of systems complete and found to be negative unless listed above.  Physical Exam: Blood pressure 127/88, pulse 91, temperature 98.3 F (36.8 C), resp. rate 18, SpO2 100.00%.  General: Well developed, well nourished, male in no acute distress Head: Eyes PERRLA, No xanthomas.   Normocephalic and atraumatic, oropharynx without edema or exudate. Dentition:  Lungs:  Heart: HRRR S1 S2, no rub/gallop, Heart  irregular rate and rhythm with S1, S2  murmur. pulses are 2+ extrem.   Neck: No carotid bruits. No lymphadenopathy.  JVD. Abdomen: Bowel sounds present, abdomen soft and non-tender without masses or hernias noted. Msk:  No spine or cva tenderness. No weakness, no joint deformities or effusions. Extremities: No clubbing or cyanosis.  edema.  Neuro: Alert and oriented X 3. No focal deficits noted. Psych:  Good affect, responds appropriately Skin: No rashes or lesions noted.  Labs:   Lab Results  Component Value Date   WBC 4.2 11/05/2012   HGB 15.7 11/05/2012   HCT 44.7 11/05/2012   MCV 82.9 11/05/2012   PLT 235 11/05/2012     Recent Labs Lab 11/05/12 1308  NA 139  K 3.9  CL 101  CO2 28  BUN 13  CREATININE 0.86  CALCIUM 9.4  GLUCOSE 261*    Recent Labs  11/05/12 1527    TROPONINI <0.30    Recent Labs  11/05/12 1330  TROPIPOC 0.00   Lab Results  Component Value Date   CHOL 287* 01/16/2010   HDL 38* 01/16/2010   LDLCALC 221* 01/16/2010   TRIG 141 01/16/2010   Echo: 11/15/2009 Study Conclusions - Left ventricle: The cavity size was severely dilated. Wall thickness was normal. Systolic function was severely reduced. The estimated ejection fraction was in the range of 20% to 25%. Diffuse hypokinesis. - Left atrium: The atrium was mildly dilated.  ECG:  [id number] 05-Nov-2012 13:01:26  Normal sinus rhythm Rightward axis ST & T wave abnormality  Vent. rate 88 BPM PR interval 142 ms QRS duration 108 ms QT/QTc 368/445 ms P-R-T axes 80 100 217   Radiology:  Dg Chest 2 View 11/05/2012   *RADIOLOGY REPORT*  Clinical Data: Chest pain  CHEST - 2 VIEW  Comparison: 11/13/2005  Findings: The heart and pulmonary vascularity are within normal limits.  The lungs are well-aerated bilaterally.  No acute bony abnormality is seen.  IMPRESSION: No acute abnormality noted.   Original Report Authenticated By: Alcide Clever, M.D.    ASSESSMENT AND PLAN:   The patient was seen today by Dr Jens Som, the patient evaluated and the data reviewed.  Active Problems:   * No active hospital problems. *   SignedTheodore Demark, PA-C 11/05/2012 4:15 PM Beeper [phone]  Co-Sign MD As above, patient seen and examined. Briefly he is a 27 year old male with a past medical history of nonischemic cardiomyopathy, diabetes mellitus and hypertension presenting with chest pain. Previous cardiac CT in 2007 was technically difficult. His ejection fraction was 41%. There was no disease in the left main, proximal LAD or right coronary artery. The circumflex was not well visualized. Last echocardiogram in August of 2011 showed an ejection fraction of 20-25% and mild left atrial enlargement. The patient has had issues with compliance. He states he has not taken any of his medications  recently. He presents with chest pain. The pain is substernal without radiation. It is described as a tightness. It improves with lying on his side. No associated symptoms. He has had intermittent chest pain for approximately 10 years. He had 30 minutes of pain on Monday. His pain began this morning and did not improve and he presented to the emergency room. He denies dyspnea. Initial enzymes negative. Electrocardiogram shows sinus rhythm with inferior lateral T-wave inversion; RAD. I do not have an electrocardiogram for comparison but these changes are described in previous notes. Chest pain is extremely atypical and unlikely to be cardiac. We will cycle  enzymes. If negative I do not think further ischemia evaluation is indicated. We'll plan to repeat echocardiogram tomorrow morning to reassess LV function. I will add low-dose carvedilol at 3.125 mg by mouth twice a day. Add lisinopril 2.5 mg daily. Increase as tolerated by pulse and blood pressure pending results of echocardiogram. I will ask family practice to see the patient for his diabetes mellitus, hyperlipidemia and hypertension which will need to be followed as an outpatient. Olga Millers 4:47 PM

## 2012-11-05 NOTE — ED Notes (Signed)
Per pt intermittent sharp chest pain. sts also that his blood sugar has been elevated and he is running low on insulin. sts some diarrhea.

## 2012-11-05 NOTE — ED Notes (Signed)
Spoke with [place] and verified pt stable to go to floor. Notified that pt still having chest tightness after 2 mg morphine. Cardiology acknowledges pain and verifies pt can go to floor at this time.

## 2012-11-05 NOTE — ED Provider Notes (Signed)
History    CSN: [id number] Arrival date & time 11/05/12  1257  First MD Initiated Contact with Patient 11/05/12 1439     Chief Complaint  Patient presents with  . Chest Pain  . Hyperglycemia   (Consider location/radiation/quality/duration/timing/severity/associated sxs/prior Treatment) HPI Comments: Pt is a 27yo male with chest pain and tightness "off and on" for 1 week, with persistent tightness since waking up this morning. Pt states he has had sharp, stabbing chest pain off and on all the past week, worst yesterday (10/10, central, non-radiating) for 5-10 minutes and some residual tightness after that. Pt complaining of chest tightness across his whole chest without frank SOB. Pt took some Advil "the other day" without much relief. Pt reports few other symptoms. Occasional "feet tingling pain" and  No dizziness, headache, cough, fever/chills, N/V, change in bowel/bladder habits, abdominal pain. Does endorse some "jitteriness" and weak feeling when his blood sugar runs low, occasionally (see immediately below), but not currently. Also endorses some blurred vision when his blood sugar is high.   On chart review (cardiology consult note 11/16/2009), pt has been seen in the past by Dr. Eden Emms for nonischemic cardiomyopathy with EF 20-25% (down from 40% in 2007), also with hx of DM, HTN, HLD. Pt reportedly on Lantus, Novolog, Lisinopril, Zocor, and KCl at that time. Pt reports he hasn't been seen by a doctor in "a long time" (at least a few years) and does not currently have a PCP. He does not remember the last time he saw Dr. Eden Emms. Pt reports he has only been taking Lantus and Novolog at home and states he "doesn't take those like he's supposed to." Pt denies cocaine use but does endorse marijuana use "a long time ago" and is an "occasional" smoker/EtOH user.  The history is provided by the patient. No language interpreter was used.   Past Medical History  Diagnosis Date  . Diabetes mellitus  without complication   . Hypertension   . Nonischemic cardiomyopathy Noted as early as 2007    Per chart review (cards consult note 2011), EF of 40% in 2007, down to 20-25% in 2011  . Hyperlipidemia    Past Surgical History  Procedure Laterality Date  . None     Family History  Problem Relation Age of Onset  . Diabetes Mother    History  Substance Use Topics  . Smoking status: Never Smoker   . Smokeless tobacco: Not on file  . Alcohol Use: Yes     Comment: "occasional" when "hanging out with the wrong people" No recent use.    Review of Systems  Constitutional: Negative for fever, chills, diaphoresis and activity change.  HENT: Negative for congestion, sore throat, rhinorrhea, sneezing, neck pain and neck stiffness.   Eyes: Positive for visual disturbance. Negative for pain, redness and itching.       Blurred vision occasionally (see HPI)  Respiratory: Positive for chest tightness. Negative for cough, shortness of breath and wheezing.        See HPI  Cardiovascular: Positive for chest pain. Negative for palpitations and leg swelling.       See HPI  Gastrointestinal: Negative for nausea, vomiting, abdominal pain, diarrhea and constipation.  Endocrine: Positive for cold intolerance. Negative for polydipsia and polyuria.  Genitourinary: Negative for dysuria, urgency, frequency, hematuria and flank pain.  Musculoskeletal: Negative for myalgias, back pain and arthralgias.  Skin: Negative for rash and wound.  Neurological: Negative for dizziness, syncope, weakness, numbness and headaches.  Psychiatric/Behavioral: Negative  for confusion and dysphoric mood. The patient is not nervous/anxious.     Allergies  Review of patient's allergies indicates no known allergies.  Home Medications   Current Outpatient Rx  Name  Route  Sig  Dispense  Refill  . ibuprofen (ADVIL,MOTRIN) 200 MG tablet   Oral   Take 200 mg by mouth every 6 (six) hours as needed for pain.         Marland Kitchen insulin  aspart (NOVOLOG) 100 UNIT/ML injection   Subcutaneous   Inject 10-50 Units into the skin 3 (three) times daily with meals.         . insulin glargine (LANTUS) 100 UNIT/ML injection   Subcutaneous   Inject 25 Units into the skin 2 (two) times daily.          BP 127/88  Pulse 91  Temp(Src) 98.3 F (36.8 C)  Resp 18  SpO2 100% Physical Exam  Nursing note and vitals reviewed. Constitutional: He is oriented to person, place, and time. He appears well-developed and well-nourished. No distress.  HENT:  Head: Normocephalic and atraumatic.  Right Ear: External ear normal.  Left Ear: External ear normal.  Mouth/Throat: No oropharyngeal exudate.  Eyes: Conjunctivae and EOM are normal. Pupils are equal, round, and reactive to light.  Neck: Normal range of motion.  Cardiovascular: Normal rate, regular rhythm, S1 normal, S2 normal, normal heart sounds and intact distal pulses.  Exam reveals no gallop.   No murmur heard. Pulmonary/Chest: Effort normal and breath sounds normal. No respiratory distress. He has no wheezes. He has no rales. He exhibits no tenderness, no laceration, no edema and no deformity.  Abdominal: Soft. Bowel sounds are normal. He exhibits no distension. There is no tenderness. There is no rebound.  Musculoskeletal: Normal range of motion. He exhibits no edema and no tenderness.  Neurological: He is alert and oriented to person, place, and time. No cranial nerve deficit.  Skin: Skin is warm and dry. No rash noted. He is not diaphoretic. No erythema.  Psychiatric: He has a normal mood and affect. His behavior is normal.    ED Course  Procedures (including critical care time)  EKG Date: 11/05/2012   Rate: 88   Rhythm: sinus   QRS Axis: rightward   Intervals: normal   ST/T Wave abnormalities: diffuse inverted T-waves (I, II, III, aVF, V4-V6)   Conduction Disturbances: incomplete right bundle branch block   Narrative Interpretation: NSR, diffuse T-wave changes,J-point  elevation in V1-V2 and reciprocal ST depression in I, aVL, and II   Old EKG Reviewed: no tracing available  1502: Hx as above, generally unremarkable exam. CBC unremarkable, BMP shows glucose 261 but no anion gap. POC troponin negative. EKG as above, concerning for possible ischemic changes vs chronic ischemia (no old tracing visualized but per cardiology consult note 11/16/09, EKG at that time showed right axis, incomplete right bundle branch block, T-wave inversions V4-V6). Denies illicit substance use, but will check UDS. Will check serum troponin and CXR but anticipate recommending at least observation for ACS evaluation.   1525: CXR unremarkable. Consulted cardiology, who will evaluate pt in the ED. Will f/u serum troponin.  1615: Serum troponin also negative. Pt still with chest tightness/discomfort but not in frank pain. Will f/u cardiology recs.  1650: Discussed with cardiology, who recommend ASA now and morphine for pain control, as well as medication management for DM, HTN, and HLD. Will consult FPTS for observation.  Labs Reviewed  BASIC METABOLIC PANEL - Abnormal; Notable for the  following:    Glucose, Bld 261 (*)    All other components within normal limits  GLUCOSE, CAPILLARY - Abnormal; Notable for the following:    Glucose-Capillary 224 (*)    All other components within normal limits  URINALYSIS, ROUTINE W REFLEX MICROSCOPIC - Abnormal; Notable for the following:    Specific Gravity, Urine 1.042 (*)    Glucose, UA 500 (*)    Bilirubin Urine SMALL (*)    Protein, ur 30 (*)    All other components within normal limits  CBC  TROPONIN I  URINE MICROSCOPIC-ADD ON  URINE RAPID DRUG SCREEN (HOSP PERFORMED)  POCT I-STAT TROPONIN I   Dg Chest 2 View  11/05/2012   *RADIOLOGY REPORT*  Clinical Data: Chest pain  CHEST - 2 VIEW  Comparison: 11/13/2005  Findings: The heart and pulmonary vascularity are within normal limits.  The lungs are well-aerated bilaterally.  No acute bony  abnormality is seen.  IMPRESSION: No acute abnormality noted.   Original Report Authenticated By: Alcide Clever, M.D.   1. Chest pain   2. Hyperglycemia without ketosis     MDM  27yo male with chest pain/tightness and hyperglycemia. Concern for cardiac chest pain given hx of nonischemic cardiomyopathy and EKG findings. Cardiology consulted in the ED (Dr. Jens Som), who recommend observation for cardiac enzymes and repeat EKG as well as medical management. Discussed with family medicine teaching service, who will place pt in observation and help facilitate outpt follow-up.  The above was discussed in its entirety with attending ED physician Dr. Rhunette Croft.   Bobbye Morton, MD  PGY-2, [facility] 11/05/2012, 5:01 PM  Bobbye Morton, MD 11/05/12 [phone]

## 2012-11-05 NOTE — ED Notes (Signed)
Report given to Vernona Rieger, RN on floor. RN has no further questions upon report. Pt being prepared for transport to floor.

## 2012-11-05 NOTE — ED Notes (Signed)
Dr Street at bedside  

## 2012-11-06 ENCOUNTER — Observation Stay (HOSPITAL_COMMUNITY): Payer: MEDICAID

## 2012-11-06 ENCOUNTER — Encounter (HOSPITAL_COMMUNITY): Payer: Self-pay | Admitting: Radiology

## 2012-11-06 DIAGNOSIS — I059 Rheumatic mitral valve disease, unspecified: Secondary | ICD-10-CM

## 2012-11-06 DIAGNOSIS — E781 Pure hyperglyceridemia: Secondary | ICD-10-CM

## 2012-11-06 DIAGNOSIS — I428 Other cardiomyopathies: Secondary | ICD-10-CM

## 2012-11-06 DIAGNOSIS — R079 Chest pain, unspecified: Secondary | ICD-10-CM

## 2012-11-06 DIAGNOSIS — R7309 Other abnormal glucose: Secondary | ICD-10-CM

## 2012-11-06 DIAGNOSIS — E785 Hyperlipidemia, unspecified: Secondary | ICD-10-CM

## 2012-11-06 DIAGNOSIS — I10 Essential (primary) hypertension: Secondary | ICD-10-CM

## 2012-11-06 LAB — COMPREHENSIVE METABOLIC PANEL
ALT: 13 U/L (ref 0–53)
AST: 16 U/L (ref 0–37)
Alkaline Phosphatase: 68 U/L (ref 39–117)
CO2: 26 mEq/L (ref 19–32)
Calcium: 9.3 mg/dL (ref 8.4–10.5)
Potassium: 4.3 mEq/L (ref 3.5–5.1)
Sodium: 137 mEq/L (ref 135–145)
Total Protein: 7.3 g/dL (ref 6.0–8.3)

## 2012-11-06 LAB — LIPID PANEL
HDL: 39 mg/dL — ABNORMAL LOW (ref >39)
LDL Cholesterol: 256 mg/dL — ABNORMAL HIGH (ref 0–99)
Total CHOL/HDL Ratio: 9.2 RATIO
VLDL: 65 mg/dL — ABNORMAL HIGH (ref 0–40)

## 2012-11-06 LAB — TSH: TSH: 1.082 u[IU]/mL (ref 0.350–4.500)

## 2012-11-06 LAB — GLUCOSE, CAPILLARY
Glucose-Capillary: 154 mg/dL — ABNORMAL HIGH (ref 70–99)
Glucose-Capillary: 223 mg/dL — ABNORMAL HIGH (ref 70–99)

## 2012-11-06 LAB — HEMOGLOBIN A1C: Hgb A1c MFr Bld: 11.4 % — ABNORMAL HIGH (ref <5.7)

## 2012-11-06 MED ORDER — METOPROLOL TARTRATE 1 MG/ML IV SOLN
INTRAVENOUS | Status: AC
  Administered 2012-11-06: 13:00:00
  Filled 2012-11-06: qty 5

## 2012-11-06 MED ORDER — ATORVASTATIN CALCIUM 40 MG PO TABS
40.0000 mg | ORAL_TABLET | Freq: Every day | ORAL | Status: DC
  Filled 2012-11-06: qty 1

## 2012-11-06 MED ORDER — INSULIN GLARGINE 100 UNIT/ML ~~LOC~~ SOLN: 25.0000 [IU] | Freq: Every day | SUBCUTANEOUS | Status: DC

## 2012-11-06 MED ORDER — LISINOPRIL 2.5 MG PO TABS: 2.5000 mg | ORAL_TABLET | Freq: Every day | ORAL | Status: DC

## 2012-11-06 MED ORDER — CARVEDILOL 3.125 MG PO TABS: 3.1250 mg | ORAL_TABLET | Freq: Two times a day (BID) | ORAL | Status: DC

## 2012-11-06 MED ORDER — ASPIRIN 81 MG PO CHEW: 81.0000 mg | CHEWABLE_TABLET | Freq: Every day | ORAL | Status: DC

## 2012-11-06 MED ORDER — ATORVASTATIN CALCIUM 40 MG PO TABS: 40.0000 mg | ORAL_TABLET | Freq: Every day | ORAL | Status: DC

## 2012-11-06 MED ORDER — NITROGLYCERIN 0.4 MG SL SUBL
SUBLINGUAL_TABLET | SUBLINGUAL | Status: AC
  Administered 2012-11-06: 13:00:00
  Filled 2012-11-06: qty 25

## 2012-11-06 MED ORDER — IOHEXOL 350 MG/ML SOLN
80.0000 mL | Freq: Once | INTRAVENOUS | Status: AC | PRN
  Administered 2012-11-06: 80 mL via INTRAVENOUS

## 2012-11-06 NOTE — Progress Notes (Addendum)
Inpatient Diabetes Program Recommendations  AACE/ADA: New Consensus Statement on Inpatient Glycemic Control (2013)  Target Ranges:  Prepandial:   less than 140 mg/dL      Peak postprandial:   less than 180 mg/dL (1-2 hours)      Critically ill patients:  140 - 180 mg/dL     Results for AZIAH, BROSTROM (MRN [id number]) as of 11/06/2012 15:41  Ref. Range 11/06/2012 07:47 11/06/2012 11:49  Glucose-Capillary Latest Range: 70-99 mg/dL 782 (H) 956 (H)    Admitted with CP.  Noted patient has history of DM.  Per records, patient is supposed to be taking Lantus 25 units bid + Novolog tid with meals at home.  Do not see that patient has a PCP listed.  Have placed care management consult to help assist patient in finding a PCP for after d/c.    **Do not see that patient has any health insurance coverage listed.  If he does, can continue Lantus and Novolog at home.  If he does not have insurance and is paying for meds out of pocket, may want to consider switching him to 70/30 insulin (patient can purchase Novolin Reli-on 70/30 insulin at [facility] for $25 per vial with a Rx).  **Waiting for current A1c results  **MD- Please consider the following in-hospital insulin adjustments: 1. Increase Lantus to 30 units QHS 2. Add Novolog meal coverage- Novolog 4 units tid with meals 3. Consider switch to 70/30 insulin for home (if patient does not have health insurance)  Will follow. Ambrose Finland RN, MSN, CDE Diabetes Coordinator Inpatient Diabetes Program [phone]

## 2012-11-06 NOTE — Care Management Note (Signed)
    Page 1 of 1   11/06/2012     4:16:43 PM   CARE MANAGEMENT NOTE 11/06/2012  Patient:  Jason Stevenson, Jason Stevenson   Account Number:  [id number]  Date Initiated:  11/06/2012  Documentation initiated by:  Donn Pierini  Subjective/Objective Assessment:   Pt admitted with chest pain     Action/Plan:   PTA pt lived at home- independent   Anticipated DC Date:  11/06/2012   Anticipated DC Plan:  HOME/SELF CARE      DC Planning Services  CM consult  MATCH Program  Medication Assistance  PCP issues      Choice offered to / List presented to:             Status of service:  Completed, signed off Medicare Important Message given?   (If response is "NO", the following Medicare IM given date fields will be blank) Date Medicare IM given:   Date Additional Medicare IM given:    Discharge Disposition:  HOME/SELF CARE  Per UR Regulation:  Reviewed for med. necessity/level of care/duration of stay  If discussed at Long Length of Stay Meetings, dates discussed:    Comments:  11/07/22- 1600- Donn Pierini RN, BSN [phone] Referral for PCP and medication needs- in to speak with pt at bedside- per conversation pt states that he does not have any of his meds- normally takes Lantus insulin and novolg- does have a meter at home but is out of strips. Pt reports that he use to be a pt at [facility] before it closed- would like to re-establish with new clinic- call made to [facility] and eligibility appointment made for Aug. 28 at 10:00- pt given info to take to appointment along with other clinics that see self pays - pt understands that other clinics may be an out of pocket expense if he needs to see a doctor prior to getting re-established with clinic. Pt also assisted with medications through [facility] program (letter given to pt)- explained about $3 copay per medication- pt voices understanding.

## 2012-11-06 NOTE — Progress Notes (Signed)
Family Medicine Teaching Service Daily Progress Note Intern Pager: [phone]  Patient name: Jason Stevenson Medical record number: [id number] Date of birth: [birth year] Age: 27 y.o. Gender: male  Primary Care Provider: No PCP Per Patient Consultants: Cardiology Code Status: Full Code  Pt Overview and Major Events to Date:  7/25 - EKG shows possible ST-Elevations; Cardiac CT  Assessment and Plan: Jason Stevenson is a 27 y.o. year old male presenting with chest pain . PMH is significant for hypertension, hyperlipidemia, diabetes mellitus type 1 and non-ischemic cardiomyopathy   1. Chest pain - Low likelihood to be cardiac etiology, constant, substernal chest pain since today. Recurring chest pain for months but worsened. No radiation to neck, jaw or arm. No history of MI. Currently does not take medication for hypertension or hyperlipidemia because of cost. Could be ACS vs costochondritis, vs pericarditis.  - previous echo with EF 20-25%  - Admited to telemetry to monitor heart rate and rhythm - EKG shows new possible ST-elevation in leads V1-V2. - Cardiology recommending Cardiac CT [ ]  f/u cardiology recommendations  2. Diabetes mellitus type 1 - currently uncontrolled at home. Is not fully compliant with insulin. Currently scheduled to take lantus 25 units BID with sliding scale novolog. He has trouble affording medication. His last A1C: 12.0 (11/15/09). Current A1C: 11.4. Last CBG: 223 - continue lantus 25 qhs - continue sensitive sliding scale insulin [ ]  f/u CBGs   3. Dilated Cardiomyopathy - previous echo showed EF of 20-25%. No signs or history of CHF. From previous echo, most likely dilated cardiomyopathy (dilated left ventricle with decreased contractility).  - Cardiology Consult- appreciate recommendations  - Lisinopril 2.5 qd and coreg 3.25 BID  - echocardiogram to assess EF and structure of heart  [ ]  f/u echocardiogram results   4. Hyperlipidemia - history of high  triglycerides (749 in 11/15/09). Choesterol: 287 on 01/16/10. Currently not taking any medication. Current cholesterol: 360; Triglycerides: 326; LDL: 256 - start lipitor 40mg  QD  FEN/GI: Heart healthy  Prophylaxis: lovenox  Disposition: pending ACS rule out and cardiology recommendations  Subjective: Mr. Corro has no complaints of chest pain, chest tightness, shortness of breath, nausea or vomiting. He feels very good and is eager to be discharged. He took a walk around the hospital before I saw him.  Objective: Temp:  [97.7 F (36.5 C)-98.3 F (36.8 C)] 97.7 F (36.5 C) (07/25 0540) Pulse Rate:  [60-91] 60 (07/25 0540) Resp:  [9-20] 18 (07/24 1919) BP: (116-139)/(73-99) 119/73 mmHg (07/25 0540) SpO2:  [96 %-100 %] 100 % (07/25 0540) Weight:  [181 lb (82.101 kg)] 181 lb (82.101 kg) (07/25 0540)  Physical Exam: General: Patient was walking around, in no acute distress Cardiovascular: RRR, no murmurs Respiratory: clear to auscultation bilaterally, diminished air movement. No wheezes Abdomen: soft, non-tender, non-distended Extremities: no edema  Laboratory:  Recent Labs Lab 11/05/12 1308  WBC 4.2  HGB 15.7  HCT 44.7  PLT 235    Recent Labs Lab 11/05/12 1308  NA 139  K 3.9  CL 101  CO2 28  BUN 13  CREATININE 0.86  CALCIUM 9.4  GLUCOSE 261*   Lipid Panel     Component Value Date/Time   CHOL 360* 11/06/2012 0535   TRIG 326* 11/06/2012 0535   HDL 39* 11/06/2012 0535   CHOLHDL 9.2 11/06/2012 0535   VLDL 65* 11/06/2012 0535   LDLCALC 256* 11/06/2012 0535   Lab Results  Component Value Date   TSH 1.082 11/05/2012     11/05/2012  15:27 11/05/2012 16:08 11/05/2012 22:05 11/06/2012 05:35  Troponin I <0.30  <0.30 <0.30    EKG (7/25) - Inverted T-waves in V5-V6, ST-elevation in leads V1-V2   Jacquelin Hawking, MD 11/06/2012, 6:41 AM PGY-1, [facility] FPTS Intern pager: [phone], text pages welcome

## 2012-11-06 NOTE — Progress Notes (Signed)
Patient ID: Jason Stevenson, male   DOB: [birth year], 27 y.o.   MRN: [id number]    Subjective:  Denies SSCP, palpitations or Dyspnea   Objective:  Filed Vitals:   11/05/12 1919 11/05/12 2115 11/06/12 0540 11/06/12 1335  BP: 139/99 116/73 119/73 111/63  Pulse: 66 63 60 67  Temp: 97.7 F (36.5 C)  97.7 F (36.5 C) 98 F (36.7 C)  TempSrc:   Oral Oral  Resp: 18   20  Height: 5\' 11"  (1.803 m)     Weight: 181 lb (82.101 kg)  181 lb (82.101 kg)   SpO2: 97%  100% 99%    Intake/Output from previous day:  Intake/Output Summary (Last 24 hours) at 11/06/12 1545 Last data filed at 11/06/12 4098  Gross per 24 hour  Intake    240 ml  Output      0 ml  Net    240 ml    Physical Exam: Affect appropriate Healthy:  appears stated age HEENT: normal Neck supple with no adenopathy JVP normal no bruits no thyromegaly Lungs clear with no wheezing and good diaphragmatic motion Heart:  S1/S2 no murmur, no rub, gallop or click PMI enlarged  Abdomen: benighn, BS positve, no tenderness, no AAA no bruit.  No HSM or HJR Distal pulses intact with no bruits No edema Neuro non-focal Skin warm and dry No muscular weakness   Lab Results: Basic Metabolic Panel:  Recent Labs  11/91/47 1308 11/06/12 0535  NA 139 137  K 3.9 4.3  CL 101 98  CO2 28 26  GLUCOSE 261* 198*  BUN 13 14  CREATININE 0.86 0.91  CALCIUM 9.4 9.3   Liver Function Tests:  Recent Labs  11/06/12 0535  AST 16  ALT 13  ALKPHOS 68  BILITOT 0.5  PROT 7.3  ALBUMIN 3.6   CBC:  Recent Labs  11/05/12 1308  WBC 4.2  HGB 15.7  HCT 44.7  MCV 82.9  PLT 235   Cardiac Enzymes:  Recent Labs  11/05/12 1527 11/05/12 2205 11/06/12 0535  TROPONINI <0.30 <0.30 <0.30   Hemoglobin A1C:  Recent Labs  11/05/12 2205  HGBA1C 11.4*   Fasting Lipid Panel:  Recent Labs  11/06/12 0535  CHOL 360*  HDL 39*  LDLCALC 256*  TRIG 326*  CHOLHDL 9.2   Thyroid Function Tests:  Recent Labs  11/05/12 2205    TSH 1.082    Imaging: Dg Chest 2 View  11/05/2012   *RADIOLOGY REPORT*  Clinical Data: Chest pain  CHEST - 2 VIEW  Comparison: 11/13/2005  Findings: The heart and pulmonary vascularity are within normal limits.  The lungs are well-aerated bilaterally.  No acute bony abnormality is seen.  IMPRESSION: No acute abnormality noted.   Original Report Authenticated By: Alcide Clever, M.D.   Ct Coronary Morp W/cta Cor W/score W/ca W/cm &/or Wo/cm  11/06/2012   OVER-READ INTERPRETATION - CT CHEST  The following report is an over-read performed by radiologist Dr. Aubery Lapping. Kearney Hard, M.D. of [facility], [place] on 11/06/2012 13:10:20.  This over-read does not include interpretation of cardiac or coronary anatomy or pathology.  The CTA interpretation by the cardiologist is attached.  Comparison:  The chest CT 11/14/2005  Findings: The visualized lung fields are clear.  No pleural effusions.  Visualized aorta is normal caliber.  No adenopathy in the visualized lower mediastinum or hila.  While not optimally performed to opacify in the pulmonary arteries, no visible filling defect in the mid to lower pulmonary arteries.  No acute bony abnormality.  No acute findings in the upper abdomen.  IMPRESSION: No acute or significant extracardiac abnormality.  Cardiac CT:  Indication: DCM EF 25% by echo Chest Pain  Protocol: The patient was scanned on a Philips 256 scanner.  Gantry rotation speed was 270 msec.  Collimation was .9mm.  The patient received 3.125 of coreg given  3 hours before the scan and an additional 5mg  of iv lopresser and nitro during the scan.  Average HR was 65 bpm.  After initial AP and lateral scouts and 3mm axial noncontrast was done through the heart for calcium scoring using the Agatson method.  The patient then received 80cc of contrast with a prospective scan triggered in the descending thoracic aorta at 111 HU's.  Radiation dose was minimized using idose 3 and reducing mA to 300.  A lead shield was used  below the waist  Findings:  Calcium Score: 0  No significant non cardiac findings  Coronary Areteries: Right dominant with no anomalies  LM- normal  LAD- normal        D1: normal        D2: normal  Circumflex- normal        OM1: normal  RCA- dominant some misregistration artifact normal        PDA- normal        PLA- normal  Impression:     1)    Calcium Score 0        2)    Normal right dominant coronary arteries Charlton Haws MD [facility]   Original Report Authenticated By: Charlett Nose, M.D.    Cardiac Studies:  ECG:   SR LVH   Telemetry:  NSR no VT 11/06/2012   Echo:  EF 25% mild MR   Medications:   . aspirin  81 mg Oral Daily  . atorvastatin  40 mg Oral q1800  . carvedilol  3.125 mg Oral BID WC  . enoxaparin (LOVENOX) injection  40 mg Subcutaneous Q24H  . insulin aspart  0-9 Units Subcutaneous TID WC  . insulin glargine  25 Units Subcutaneous QHS  . lisinopril  2.5 mg Oral Daily       Assessment/Plan:  Chest Pain:  CT with no CAD and calcium score of 0 DCM:  Nonischemic DCM with known history and non compliant with meds. Try to d/c with current dose of ACE and beta blocker I will see in 4-6 weeks and try to up titrate Eventually f/u with Dr Teressa Lower in CHF clinic Chol:  Continue statin DM:  Needs better education and adjustment of insulin does  A1c very high  D/C per primary service  Dillon Bjork 11/06/2012, 3:45 PM

## 2012-11-06 NOTE — Discharge Summary (Signed)
Family Medicine Teaching [facility] Discharge Summary  Patient name: Jason Stevenson Medical record number: [id number] Date of birth: Oct 31, 1985 Age: 27 y.o. Gender: male Date of Admission: 11/05/2012  Date of Discharge: 11/06/2012  Admitting Physician: Janit Pagan, MD  Primary Care Provider: No PCP Per Patient Consultants: Cardiology  Indication for Hospitalization: Chest pain  Discharge Diagnoses/Problem List:  1. Chest Pain 2. Diabetes Mellitus Type 1 (Late oneset) 3. Dilated Cardiomyopathy 4. Hyperlipidemia  Disposition: Home  Discharge Condition: Stable  Brief Hospital Course: Mr. Konopka presented with chest pain which was constant the day of admission and had been intermittent the two days prior. In the ED,he received morphine with little improvement and no nitroglycerin. He was started on carvedilol and lisinopril. EKG had inverted T-waves, however, cardiac CT did not suggest ischemic event. Troponin was negative three times. Echo revealed an unchanged EF of 20%-25%. His lipid panel showed elevated total cholesterol, triglycerides and LDL. He was started on simvastatin and switched to atorvastatin 40mg . Follow up with outpatient cardiology was established and he was discharged on new prescriptions of carvedilol, lisinopril, atorvastatin and Lantus 25 units.  Issues for Follow Up:  1. Medication compliance 2. Financial concern regarding ability to pay for medications 3. Elevated lipids 4. Continued monitoring of his dilated cardiomyopathy  Significant Procedures: None  Significant Labs and Imaging:   Recent Labs Lab 11/05/12 1308  WBC 4.2  HGB 15.7  HCT 44.7  PLT 235    Recent Labs Lab 11/05/12 1308 11/06/12 0535  NA 139 137  K 3.9 4.3  CL 101 98  CO2 28 26  GLUCOSE 261* 198*  BUN 13 14  CREATININE 0.86 0.91  CALCIUM 9.4 9.3  ALKPHOS  --  68  AST  --  16  ALT  --  13  ALBUMIN  --  3.6    Chest X-ray (7/24) Findings: The heart and  pulmonary vascularity are within normal  limits. The lungs are well-aerated bilaterally. No acute bony  abnormality is seen.  IMPRESSION:  No acute abnormality noted.  Cardic CT (7/25) Findings:  Calcium Score: 0  No significant non cardiac findings  Coronary Areteries: Right dominant with no anomalies  LM- normal  LAD- normal  D1: normal  D2: normal  Circumflex- normal  OM1: normal  RCA- dominant some misregistration artifact normal  PDA- normal  PLA- normal  Impression:  1) Calcium Score 0  2) Normal right dominant coronary arteries  CT Chest (7/25) Findings: The visualized lung fields are clear. No pleural  effusions. Visualized aorta is normal caliber. No adenopathy in  the visualized lower mediastinum or hila. While not optimally  performed to opacify in the pulmonary arteries, no visible filling  defect in the mid to lower pulmonary arteries. No acute bony  abnormality. No acute findings in the upper abdomen.  IMPRESSION:  No acute or significant extracardiac abnormality.  Outstanding Results: None  Discharge Medications:    Medication List         aspirin 81 MG chewable tablet  Chew 1 tablet (81 mg total) by mouth daily.     atorvastatin 40 MG tablet  Commonly known as:  LIPITOR  Take 1 tablet (40 mg total) by mouth daily at 6 PM.     carvedilol 3.125 MG tablet  Commonly known as:  COREG  Take 1 tablet (3.125 mg total) by mouth 2 (two) times daily with a meal.     ibuprofen 200 MG tablet  Commonly known as:  ADVIL,MOTRIN  Take 200 mg by mouth every 6 (six) hours as needed for pain.     insulin aspart 100 UNIT/ML injection  Commonly known as:  novoLOG  Inject 10-50 Units into the skin 3 (three) times daily with meals.     insulin glargine 100 UNIT/ML injection  Commonly known as:  LANTUS  Inject 0.25 mLs (25 Units total) into the skin at bedtime.     lisinopril 2.5 MG tablet  Commonly known as:  PRINIVIL,ZESTRIL  Take 1 tablet (2.5 mg total) by  mouth daily.        Discharge Instructions: Please refer to Patient Instructions section of EMR for full details.  Patient was counseled important signs and symptoms that should prompt return to medical care, changes in medications, dietary instructions, activity restrictions, and follow up appointments.   Follow-Up Appointments:     Follow-up Information   Follow up with [email or web address]. (pt has eligibility appointment for [facility]. Aug. 28 at 10:00- please call if you can not make this and reschedule- bring needed paperwork with you for orange card eligibility)    Contact information:   [address] [place] [address] [phone]      Follow up with Charlton Haws, MD On 12/16/2012. (9:45 AM)    Contact information:   [address] [place] [address] [phone]       Follow up with [facility]     On 11/13/2012. (3:45PM with Dr. Susie Cassette)    Contact information:   [address] [place] [address]       Jacquelin Hawking, MD 11/06/2012, 4:09 PM PGY-1, [facility]

## 2012-11-06 NOTE — Progress Notes (Signed)
Pt assessment unchanged from this am. D/c'd home with nurse tech

## 2012-11-06 NOTE — Progress Notes (Signed)
Clinical Child psychotherapist (CSW) received an inapporpriate referral to establish pt with a PCP. Referral to be made to [facility]. CSW signing off.  Theresia Bough, MSW, Theresia Majors [phone]

## 2012-11-06 NOTE — Progress Notes (Signed)
Patient ID: Jason Stevenson, male   DOB: 1986-02-27, 27 y.o.   MRN: [id number]    Subjective:  STill with chest pains sharp and central  Objective:  Filed Vitals:   11/05/12 1830 11/05/12 1919 11/05/12 2115 11/06/12 0540  BP: 126/84 139/99 116/73 119/73  Pulse: 63 66 63 60  Temp: 98 F (36.7 C) 97.7 F (36.5 C)  97.7 F (36.5 C)  TempSrc: Oral   Oral  Resp:  18    Height:  5\' 11"  (1.803 m)    Weight:  181 lb (82.101 kg)  181 lb (82.101 kg)  SpO2: 98% 97%  100%    Intake/Output from previous day: No intake or output data in the 24 hours ending 11/06/12 0736  Physical Exam: Affect appropriate Healthy:  appears stated age HEENT: normal Neck supple with no adenopathy JVP normal no bruits no thyromegaly Lungs clear with no wheezing and good diaphragmatic motion Heart:  S1/S2 no murmur, no rub, gallop or click PMI normal Abdomen: benighn, BS positve, no tenderness, no AAA no bruit.  No HSM or HJR Distal pulses intact with no bruits No edema Neuro non-focal Skin warm and dry No muscular weakness   Lab Results: Basic Metabolic Panel:  Recent Labs  63/87/56 1308 11/06/12 0535  NA 139 137  K 3.9 4.3  CL 101 98  CO2 28 26  GLUCOSE 261* 198*  BUN 13 14  CREATININE 0.86 0.91  CALCIUM 9.4 9.3   Liver Function Tests:  Recent Labs  11/06/12 0535  AST 16  ALT 13  ALKPHOS 68  BILITOT 0.5  PROT 7.3  ALBUMIN 3.6   CBC:  Recent Labs  11/05/12 1308  WBC 4.2  HGB 15.7  HCT 44.7  MCV 82.9  PLT 235   Cardiac Enzymes:  Recent Labs  11/05/12 1527 11/05/12 2205 11/06/12 0535  TROPONINI <0.30 <0.30 <0.30   Fasting Lipid Panel:  Recent Labs  11/06/12 0535  CHOL 360*  HDL 39*  LDLCALC 256*  TRIG 326*  CHOLHDL 9.2    Imaging: Dg Chest 2 View  11/05/2012   *RADIOLOGY REPORT*  Clinical Data: Chest pain  CHEST - 2 VIEW  Comparison: 11/13/2005  Findings: The heart and pulmonary vascularity are within normal limits.  The lungs are well-aerated  bilaterally.  No acute bony abnormality is seen.  IMPRESSION: No acute abnormality noted.   Original Report Authenticated By: Alcide Clever, M.D.    Cardiac Studies:  ECG:  SR inferolateral T wave changes   Telemetry: NSR no arrhythmia  Echo: pending  Medications:   . aspirin  81 mg Oral Daily  . carvedilol  3.125 mg Oral BID WC  . enoxaparin (LOVENOX) injection  40 mg Subcutaneous Q24H  . insulin aspart  0-9 Units Subcutaneous TID WC  . insulin glargine  25 Units Subcutaneous QHS  . lisinopril  2.5 mg Oral Daily  . simvastatin  20 mg Oral q1800       Assessment/Plan:  Chest Pain: With history of DCM and abnormal ECG  Cardiac CT today. Previous study done on 64 slice scanner in 2007 DCM:  F/U echo continue ACE DM:  Per primary service on insulin check A1c Chol:  Continue statin assess plaque burden with calcium score   Charlton Haws 11/06/2012, 7:36 AM

## 2012-11-06 NOTE — H&P (Signed)
FMTS Attending Admission Note: Jason Corkery,MD I  have seen and examined this patient, reviewed their chart. I have discussed this patient with the resident. I agree with the resident's findings, assessment and care plan.  27 y/o M with PMX Non-ischaemic cardiomyopathy, DM1,HLD, present to the hospital with few days hx of chest tightness which gradually worsened,he denies any associated SOB,no cough,no palpitations,no leg swelling,no orthopnea,no PND,he had similar episodes in the past and was diagnosed with cardiomyopathy for which he was seeing a cardiologist and was on medication,but he d/c follow up and all medications about 2 yrs ago except his lantus 25 units and sliding scale for his DM. Patient feels better now,chest pain has resolved.  Exam; Filed Vitals:   11/05/12 1830 11/05/12 1919 11/05/12 2115 11/06/12 0540  BP: 126/84 139/99 116/73 119/73  Pulse: 63 66 63 60  Temp: 98 F (36.7 C) 97.7 F (36.5 C)  97.7 F (36.5 C)  TempSrc: Oral   Oral  Resp:  18    Height:  5\' 11"  (1.803 m)    Weight:  181 lb (82.101 kg)  181 lb (82.101 kg)  SpO2: 98% 97%  100%   Gen; Calm in bed,not in distress. V/S stable. HEENT: PERRLA,EOMI. Heart: S1, S2 normal,no murmurs RRR. Resp: Air entry equal b/l,no wheezing,no crepitations. Abd: Soft, NT/ND,bowel sound normal. Ext; No edema.  A/P; 27 y/O male with 1. Chest pain R/O ACS and Non-Ischemic cardiomyopathy.     EKG reviewed show T-wave inversion on II,aVl and lateral leads, repeat EKG today was similar.     No telemetry alarm reported over night.     TNI neg x 3.      F/U TSH, A1C report.      Agree with Lisinopril, beta blocker     Appreciate cardiology evaluation.     Awaiting ECHO, if normal may d/c home on medication with cardiology follow up.  2. DM; Continue home medication with sliding scale.     F/U A1C. Monitor serum glucose.     Diabetic diet.  3.Hyperlipidemia: Repeat FLP worsened from last done in 2011.   I agree with  restarting Statin.

## 2012-11-06 NOTE — Progress Notes (Signed)
  Echocardiogram 2D Echocardiogram has been performed.  Cathie Beams 11/06/2012, 9:50 AM

## 2012-11-06 NOTE — Progress Notes (Signed)
Utilization review completed. Nikolette Reindl, RN, BSN. 

## 2012-11-07 NOTE — ED Provider Notes (Signed)
I performed a history and physical examination of  Jason Stevenson and discussed his management with Dr. Casper Harrison. I agree with the history, physical, assessment, and plan of care, with the following exceptions: None I was present for the following procedures: None  Time Spent in Critical Care of the patient: None  Time spent in discussions with the patient and family: 5 minutes.  Young man with NICM, advanced CHF, comes in with chest pain. No acute findings during the ED stay. Cards to be consultants, and patient to be admitted by [facility] service.  Retina Bernardy   Derwood Kaplan, MD 11/07/12 [phone]

## 2012-11-07 NOTE — Progress Notes (Signed)
Reviewed and discussed with Dr. Caleb Popp.  Agree with his management and documentation.

## 2012-11-08 NOTE — Discharge Summary (Signed)
Reviewed and agree with Dr. Dennison Nancy documentation and management.

## 2012-11-11 NOTE — Progress Notes (Signed)
PT HAS APPT ON  12-16-12 AT 9:45

## 2012-11-13 ENCOUNTER — Ambulatory Visit: Payer: Self-pay | Attending: Family Medicine | Admitting: Internal Medicine

## 2012-11-13 VITALS — BP 123/72 | HR 74 | Temp 98.4°F | Resp 16 | Wt 184.6 lb

## 2012-11-13 DIAGNOSIS — I428 Other cardiomyopathies: Secondary | ICD-10-CM | POA: Insufficient documentation

## 2012-11-13 DIAGNOSIS — E119 Type 2 diabetes mellitus without complications: Secondary | ICD-10-CM | POA: Insufficient documentation

## 2012-11-13 DIAGNOSIS — I5022 Chronic systolic (congestive) heart failure: Secondary | ICD-10-CM

## 2012-11-13 DIAGNOSIS — E785 Hyperlipidemia, unspecified: Secondary | ICD-10-CM | POA: Insufficient documentation

## 2012-11-13 MED ORDER — INSULIN SYRINGES (DISPOSABLE) U-100 1 ML MISC: Status: DC

## 2012-11-13 NOTE — Progress Notes (Signed)
Patient here for hospital follow up History of DM and heart problem

## 2012-11-13 NOTE — Progress Notes (Signed)
Patient ID: Jason Stevenson, male   DOB: [birth year], 27 y.o.   MRN: [id number]  CC:  HPI:   Jason Stevenson presented was admitted for chest pain, discharge on 7/25 found to have nonischemic dilated cardiomyopathy.  seen by cardiology during his most recent hospitalization He was started on carvedilol and lisinopril. EKG had inverted T-waves, however, cardiac CT scan did not show any coronary artery disease, calcium score of 0. Troponin was negative three times. Echo revealed an unchanged EF of 20%-25%. His lipid panel showed elevated total cholesterol, triglycerides and LDL. Hemoglobin A1c of 11.4. He was started on simvastatin and switched to atorvastatin 40mg . . He was also initiated on Lantus. He presents to the clinic for a followup and states that he has taken his NovoLog only twice in the last 6 days. He has not been taking his Lantus. He has not been doing Accu-Cheks either. He seems to laugh it off, and is not at all concerned about his health   No Known Allergies Past Medical History  Diagnosis Date  . Diabetes mellitus without complication   . Hypertension   . Nonischemic cardiomyopathy Noted as early as 2007    Per chart review (cards consult note 2011), EF of 40% in 2007, down to 20-25% in 2011  . Hyperlipidemia    Current Outpatient Prescriptions on File Prior to Visit  Medication Sig Dispense Refill  . aspirin 81 MG chewable tablet Chew 1 tablet (81 mg total) by mouth daily.  30 tablet  30  . atorvastatin (LIPITOR) 40 MG tablet Take 1 tablet (40 mg total) by mouth daily at 6 PM.  30 tablet  0  . carvedilol (COREG) 3.125 MG tablet Take 1 tablet (3.125 mg total) by mouth 2 (two) times daily with a meal.  60 tablet  0  . ibuprofen (ADVIL,MOTRIN) 200 MG tablet Take 200 mg by mouth every 6 (six) hours as needed for pain.      Marland Kitchen insulin aspart (NOVOLOG) 100 UNIT/ML injection Inject 10-50 Units into the skin 3 (three) times daily with meals.      . insulin glargine (LANTUS) 100  UNIT/ML injection Inject 0.25 mLs (25 Units total) into the skin at bedtime.  10 mL  12  . lisinopril (PRINIVIL,ZESTRIL) 2.5 MG tablet Take 1 tablet (2.5 mg total) by mouth daily.  30 tablet  0   No current facility-administered medications on file prior to visit.   Family History  Problem Relation Age of Onset  . Diabetes Mother    History   Social History  . Marital Status: Single    Spouse Name: N/A    Number of Children: N/A  . Years of Education: N/A   Occupational History  . [employer]    Social History Main Topics  . Smoking status: Never Smoker   . Smokeless tobacco: Not on file  . Alcohol Use: Yes     Comment: "occasional" when "hanging out with the wrong people" No recent use.  . Drug Use: Yes    Special: Marijuana     Comment: Last in 2013  . Sexually Active: Not on file   Other Topics Concern  . Not on file   Social History Narrative   Pt lives alone. Has no information on father.    Review of Systems  Constitutional: Negative for fever, chills, diaphoresis, activity change, appetite change and fatigue.  HENT: Negative for ear pain, nosebleeds, congestion, facial swelling, rhinorrhea, neck pain, neck stiffness and ear discharge.  Eyes: Negative for pain, discharge, redness, itching and visual disturbance.  Respiratory: Negative for cough, choking, chest tightness, shortness of breath, wheezing and stridor.   Cardiovascular: Negative for chest pain, palpitations and leg swelling.  Gastrointestinal: Negative for abdominal distention.  Genitourinary: Negative for dysuria, urgency, frequency, hematuria, flank pain, decreased urine volume, difficulty urinating and dyspareunia.  Musculoskeletal: Negative for back pain, joint swelling, arthralgias and gait problem.  Neurological: Negative for dizziness, tremors, seizures, syncope, facial asymmetry, speech difficulty, weakness, light-headedness, numbness and headaches.  Hematological: Negative for adenopathy.  Does not bruise/bleed easily.  Psychiatric/Behavioral: Negative for hallucinations, behavioral problems, confusion, dysphoric mood, decreased concentration and agitation.    Objective:   Filed Vitals:   11/13/12 1603  BP: 123/72  Pulse: 74  Temp: 98.4 F (36.9 C)  Resp: 16    Physical Exam  Constitutional: Appears well-developed and well-nourished. No distress.  HENT: Normocephalic. External right and left ear normal. Oropharynx is clear and moist.  Eyes: Conjunctivae and EOM are normal. PERRLA, no scleral icterus.  Neck: Normal ROM. Neck supple. No JVD. No tracheal deviation. No thyromegaly.  CVS: RRR, S1/S2 +, no murmurs, no gallops, no carotid bruit.  Pulmonary: Effort and breath sounds normal, no stridor, rhonchi, wheezes, rales.  Abdominal: Soft. BS +,  no distension, tenderness, rebound or guarding.  Musculoskeletal: Normal range of motion. No edema and no tenderness.  Lymphadenopathy: No lymphadenopathy noted, cervical, inguinal. Neuro: Alert. Normal reflexes, muscle tone coordination. No cranial nerve deficit. Skin: Skin is warm and dry. No rash noted. Not diaphoretic. No erythema. No pallor.  Psychiatric: Normal mood and affect. Behavior, judgment, thought content normal.   Lab Results  Component Value Date   WBC 4.2 11/05/2012   HGB 15.7 11/05/2012   HCT 44.7 11/05/2012   MCV 82.9 11/05/2012   PLT 235 11/05/2012   Lab Results  Component Value Date   CREATININE 0.91 11/06/2012   BUN 14 11/06/2012   NA 137 11/06/2012   K 4.3 11/06/2012   CL 98 11/06/2012   CO2 26 11/06/2012    Lab Results  Component Value Date   HGBA1C 11.4* 11/05/2012   Lipid Panel     Component Value Date/Time   CHOL 360* 11/06/2012 0535   TRIG 326* 11/06/2012 0535   HDL 39* 11/06/2012 0535   CHOLHDL 9.2 11/06/2012 0535   VLDL 65* 11/06/2012 0535   LDLCALC 256* 11/06/2012 0535       Assessment and plan:   Patient Active Problem List   Diagnosis Date Noted  . Hyperglycemia 11/06/2012  .  Non-ischemic cardiomyopathy 11/06/2012  . HTN (hypertension) 11/06/2012  . HLD (hyperlipidemia) 11/06/2012  . Hypertriglyceridemia 11/06/2012  . Chest pain 11/05/2012       Diabetes Emphasize compliance with NovoLog, Lantus, Accu-Cheks Advised to write his CBG done before his next visit  Dyslipidemia Patient to continue taking his statin  Nondilated cardiomyopathy He needs to followup with the heart failure clinic We'll give him a referral Continue Coreg, lisinopril, advised to stop using ibuprofen  Followup in 2 weeks

## 2012-11-20 ENCOUNTER — Encounter: Payer: Self-pay | Admitting: Internal Medicine

## 2012-11-20 ENCOUNTER — Ambulatory Visit: Payer: Self-pay | Attending: Family Medicine | Admitting: Internal Medicine

## 2012-11-20 ENCOUNTER — Telehealth: Payer: Self-pay | Admitting: Family Medicine

## 2012-11-20 VITALS — BP 117/77 | HR 70 | Temp 98.9°F | Resp 16 | Ht 71.0 in | Wt 187.6 lb

## 2012-11-20 DIAGNOSIS — I428 Other cardiomyopathies: Secondary | ICD-10-CM

## 2012-11-20 DIAGNOSIS — R079 Chest pain, unspecified: Secondary | ICD-10-CM

## 2012-11-20 DIAGNOSIS — I10 Essential (primary) hypertension: Secondary | ICD-10-CM

## 2012-11-20 DIAGNOSIS — IMO0001 Reserved for inherently not codable concepts without codable children: Secondary | ICD-10-CM

## 2012-11-20 DIAGNOSIS — E119 Type 2 diabetes mellitus without complications: Secondary | ICD-10-CM | POA: Insufficient documentation

## 2012-11-20 MED ORDER — INSULIN GLARGINE 100 UNIT/ML ~~LOC~~ SOLN: 25.0000 [IU] | Freq: Every day | SUBCUTANEOUS | Status: DC

## 2012-11-20 MED ORDER — INSULIN ASPART 100 UNIT/ML ~~LOC~~ SOLN: 10.0000 [IU] | Freq: Three times a day (TID) | SUBCUTANEOUS | Status: DC

## 2012-11-20 MED ORDER — INSULIN SYRINGES (DISPOSABLE) U-100 1 ML MISC: Status: DC

## 2012-11-20 MED ORDER — NITROGLYCERIN 0.4 MG SL SUBL: 0.4000 mg | SUBLINGUAL_TABLET | SUBLINGUAL | Status: DC | PRN

## 2012-11-20 NOTE — Telephone Encounter (Signed)
Was this paperwork left with doctors?

## 2012-11-20 NOTE — Telephone Encounter (Signed)
Pt would like paperwork filled out for return to work.

## 2012-11-20 NOTE — Progress Notes (Signed)
Pt here to get papers signed to return back to work. Pt states he was Diag with Cardiomyopathy and prescribed meds, but still doesn't feel ready to return to work. vss

## 2012-11-20 NOTE — Progress Notes (Signed)
Patient ID: Jason Stevenson, male   DOB: 1985/08/22, 27 y.o.   MRN: [id number] Patient Demographics  Jason Stevenson, is a 27 y.o. male  ZHY:[id number]  [id number]:[id number]  DOB - 1985/08/05  Chief Complaint  Patient presents with  . Follow-up    need paper work filled out for work        Subjective:   Jason Stevenson today is here for a follow up visit. Patient is a 27 year old male with history of cardiomyopathy, EF 20-25%, diabetes and hemoglobin A1c of 11.4, followed by [facility]ology presented to the clinic for return to work and [employer] paperwork.  Patient has No headache, No chest pain, No abdominal pain - No Nausea, No new weakness tingling or numbness, No Cough - SOB.   Objective:    Filed Vitals:   11/20/12 1525  BP: 117/77  Pulse: 70  Temp: 98.9 F (37.2 C)  TempSrc: Oral  Resp: 16  Height: 5\' 11"  (1.803 m)  Weight: 187 lb 9.6 oz (85.095 kg)  SpO2: 96%     ALLERGIES:  No Known Allergies  PAST MEDICAL HISTORY: Past Medical History  Diagnosis Date  . Diabetes mellitus without complication   . Hypertension   . Nonischemic cardiomyopathy Noted as early as 2007    Per chart review (cards consult note 2011), EF of 40% in 2007, down to 20-25% in 2011  . Hyperlipidemia     MEDICATIONS AT HOME: Prior to Admission medications   Medication Sig Start Date End Date Taking? Authorizing Provider  aspirin 81 MG chewable tablet Chew 1 tablet (81 mg total) by mouth daily. 11/06/12  Yes Jacquelin Hawking, MD  atorvastatin (LIPITOR) 40 MG tablet Take 1 tablet (40 mg total) by mouth daily at 6 PM. 11/06/12  Yes Jacquelin Hawking, MD  carvedilol (COREG) 3.125 MG tablet Take 1 tablet (3.125 mg total) by mouth 2 (two) times daily with a meal. 11/06/12  Yes Jacquelin Hawking, MD  insulin aspart (NOVOLOG) 100 UNIT/ML injection Inject 10-50 Units into the skin 3 (three) times daily with meals. 11/20/12  Yes Ivis Henneman Jenna Luo, MD  insulin glargine (LANTUS) 100 UNIT/ML injection Inject 0.25 mLs (25  Units total) into the skin at bedtime. 11/20/12  Yes Sostenes Kauffmann Jenna Luo, MD  Insulin Syringes, Disposable, U-100 1 ML MISC Insulin syringes 30-gauge 11/20/12  Yes Lukus Binion K Kymoni Lesperance, MD  lisinopril (PRINIVIL,ZESTRIL) 2.5 MG tablet Take 1 tablet (2.5 mg total) by mouth daily. 11/06/12  Yes Jacquelin Hawking, MD  nitroGLYCERIN (NITROSTAT) 0.4 MG SL tablet Place 1 tablet (0.4 mg total) under the tongue every 5 (five) minutes as needed for chest pain. 11/20/12   Jishnu Jenniges Jenna Luo, MD     Exam  General appearance :Awake, alert, NAD, Speech Clear.  HEENT: Atraumatic and Normocephalic, PERLA Neck: supple, no JVD. No cervical lymphadenopathy.  Chest: Clear to auscultation bilaterally, no wheezing, rales or rhonchi CVS: S1 S2 regular, no murmurs.  Abdomen: soft, NBS, NT, ND, no gaurding, rigidity or rebound. Extremities: no cyanosis or clubbing, B/L Lower Ext shows no edema Neurology: Awake alert, and oriented X 3, CN II-XII intact, Non focal Skin: No Rash or lesions Wounds:N/A    Data Review   Basic Metabolic Panel: No results found for this basename: NA, K, CL, CO2, GLUCOSE, BUN, CREATININE, CALCIUM, MG, PHOS,  in the last 168 hours Liver Function Tests: No results found for this basename: AST, ALT, ALKPHOS, BILITOT, PROT, ALBUMIN,  in the last 168 hours  CBC: No results found for this basename: WBC,  NEUTROABS, HGB, HCT, MCV, PLT,  in the last 168 hours  ------------------------------------------------------------------------------------------------------------------ No results found for this basename: HGBA1C,  in the last 72 hours ------------------------------------------------------------------------------------------------------------------ No results found for this basename: CHOL, HDL, LDLCALC, TRIG, CHOLHDL, LDLDIRECT,  in the last 72 hours ------------------------------------------------------------------------------------------------------------------ No results found for this basename: TSH, T4TOTAL,  FREET3, T3FREE, THYROIDAB,  in the last 72 hours ------------------------------------------------------------------------------------------------------------------ No results found for this basename: VITAMINB12, FOLATE, FERRITIN, TIBC, IRON, RETICCTPCT,  in the last 72 hours  Coagulation profile  No results found for this basename: INR, PROTIME,  in the last 168 hours    Assessment & Plan   Active Problems: Diabetes mellitus: - Refill NovoLog, Lantus and insulin syringes  Non-dilated cardiomyopathy - He has an appointment with Dr Eden Emms on September 3rd at 9:45am - Gave him prescription for nitroglycerin sublingual if needed  Given work note and time spent filling the FMLA paperwork 20 mins    Follow-up in 2 months     Jason Stevenson M.D. 11/20/2012, 3:37 PM

## 2012-11-25 ENCOUNTER — Telehealth: Payer: Self-pay | Admitting: Family Medicine

## 2012-11-25 NOTE — Telephone Encounter (Signed)
11/25/12 Patient requesting a written notice with specific work restrictions for his job.Would like to come and  Pick up today. P.Minard Millirons,RN BSN MHA

## 2012-11-25 NOTE — Telephone Encounter (Signed)
Pt work FAX# [phone]

## 2012-11-25 NOTE — Telephone Encounter (Signed)
Pt just needs a document faxed with clarification on what light duty he is able to perform at work; pt is at work today; Pt saw Dr. Isidoro Donning on Aug. 8; Fax #

## 2012-11-27 ENCOUNTER — Encounter: Payer: Self-pay | Admitting: Internal Medicine

## 2012-11-27 ENCOUNTER — Ambulatory Visit: Payer: Self-pay | Attending: Family Medicine | Admitting: Internal Medicine

## 2012-11-27 NOTE — Progress Notes (Unsigned)
Patient ID: Jason Stevenson, male   DOB: 1985-06-04, 27 y.o.   MRN: [id number]   Patient presented to the clinic for his FMLA paperwork to be filled out. The patient requested excuse from work up until 8/15 He has been instructed to not lift anything heavier than 2-3 pounds. 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

## 2012-11-27 NOTE — Progress Notes (Unsigned)
PT HERE FOR MEDICAL CLEARANCE PAPERS SIGNED FOR WORK. DR. RAI FILLED OUT FORMS ,BUT PT NEED MORE DOCUMENTATION OR JOB MAY BE TERMINATED.

## 2012-12-07 MED ORDER — ATORVASTATIN CALCIUM 40 MG PO TABS: 40.0000 mg | ORAL_TABLET | Freq: Every day | ORAL | Status: DC

## 2012-12-07 MED ORDER — LISINOPRIL 2.5 MG PO TABS: 2.5000 mg | ORAL_TABLET | Freq: Every day | ORAL | Status: DC

## 2012-12-07 MED ORDER — CARVEDILOL 3.125 MG PO TABS: 3.1250 mg | ORAL_TABLET | Freq: Two times a day (BID) | ORAL | Status: DC

## 2012-12-16 ENCOUNTER — Encounter: Payer: Self-pay | Admitting: *Deleted

## 2012-12-16 ENCOUNTER — Ambulatory Visit (INDEPENDENT_AMBULATORY_CARE_PROVIDER_SITE_OTHER): Payer: Self-pay | Admitting: Physician Assistant

## 2012-12-16 ENCOUNTER — Encounter: Payer: Self-pay | Admitting: Cardiovascular Disease

## 2012-12-16 ENCOUNTER — Encounter: Payer: Self-pay | Admitting: Physician Assistant

## 2012-12-16 VITALS — BP 119/86 | HR 90 | Ht 71.0 in | Wt 187.8 lb

## 2012-12-16 DIAGNOSIS — E785 Hyperlipidemia, unspecified: Secondary | ICD-10-CM

## 2012-12-16 DIAGNOSIS — I428 Other cardiomyopathies: Secondary | ICD-10-CM

## 2012-12-16 DIAGNOSIS — R079 Chest pain, unspecified: Secondary | ICD-10-CM

## 2012-12-16 DIAGNOSIS — I10 Essential (primary) hypertension: Secondary | ICD-10-CM

## 2012-12-16 MED ORDER — FAMOTIDINE 20 MG PO TABS: 20.0000 mg | ORAL_TABLET | Freq: Two times a day (BID) | ORAL | Status: DC

## 2012-12-16 MED ORDER — NITROGLYCERIN 0.4 MG SL SUBL: 0.4000 mg | SUBLINGUAL_TABLET | SUBLINGUAL | Status: DC | PRN

## 2012-12-16 MED ORDER — CARVEDILOL 6.25 MG PO TABS: 6.2500 mg | ORAL_TABLET | Freq: Two times a day (BID) | ORAL | Status: DC

## 2012-12-16 NOTE — Progress Notes (Signed)
[address] [place], [place]  [address] Phone: [phone] Fax:  [phone]  Date:  12/16/2012   ID:  Jason Stevenson, DOB Jul 03, 1985, MRN [id number]  PCP:  Standley Dakins, MD  Cardiologist:  Dr. Charlton Haws     History of Present Illness: Jason Stevenson is a 27 y.o. male who returns for follow up after a recent admission to the hospital with CP 7/24-7/25.  He has a hx of nonischemic cardiomyopathy, diabetes mellitus, HTN, HL.  Cardiac MRI 04/2006: EF 45%, no hyper-enhancement. He has been lost to follow up. Recently seen in the hospital after presenting with chest pain. Myocardial infarction was ruled out by enzymes. ECG demonstrated anterolateral T wave inversions. Beta blocker and ACE inhibitor therapy was restarted.  Of note, the patient has a history of noncompliance with medications and follow up. Echocardiogram 11/06/12: EF 20-25%, diffuse HK, mild MR. Cardiac CTA 7/14: Calcium score 0, normal coronary arteries.  He has occasional chest pain. He thinks that this is related to meals. He denies significant dyspnea with exertion. He is NYHA class II. He denies orthopnea, PND or edema. He denies syncope.  Labs (7/14):  K 4.3, creatinine 0.91, ALT 13, TC 360, TG 326, HDL 39, LDL 256, Hgb 15.7, A1c 11.4, TSH 1.082  Wt Readings from Last 3 Encounters:  11/27/12 186 lb 6.4 oz (84.55 kg)  11/20/12 187 lb 9.6 oz (85.095 kg)  11/13/12 184 lb 9.6 oz (83.734 kg)     Past Medical History  Diagnosis Date  . Diabetes mellitus without complication   . Hypertension   . Nonischemic cardiomyopathy Noted as early as 2007    Per chart review (cards consult note 2011), EF of 40% in 2007, down to 20-25% in 2011  . Hyperlipidemia     Current Outpatient Prescriptions  Medication Sig Dispense Refill  . aspirin 81 MG chewable tablet Chew 1 tablet (81 mg total) by mouth daily.  30 tablet  30  . atorvastatin (LIPITOR) 40 MG tablet Take 1 tablet (40 mg total) by mouth daily  at 6 PM.  30 tablet  3  . carvedilol (COREG) 3.125 MG tablet Take 1 tablet (3.125 mg total) by mouth 2 (two) times daily with a meal.  60 tablet  3  . insulin aspart (NOVOLOG) 100 UNIT/ML injection Inject 10-50 Units into the skin 3 (three) times daily with meals.  1 vial  12  . insulin glargine (LANTUS) 100 UNIT/ML injection Inject 0.25 mLs (25 Units total) into the skin at bedtime.  10 mL  12  . Insulin Syringes, Disposable, U-100 1 ML MISC Insulin syringes 30-gauge  100 each  12  . lisinopril (PRINIVIL,ZESTRIL) 2.5 MG tablet Take 1 tablet (2.5 mg total) by mouth daily.  30 tablet  5  . nitroGLYCERIN (NITROSTAT) 0.4 MG SL tablet Place 1 tablet (0.4 mg total) under the tongue every 5 (five) minutes as needed for chest pain.  50 tablet  3   No current facility-administered medications for this visit.    Allergies:   No Known Allergies  Social History:  The patient  reports that he has never smoked. He does not have any smokeless tobacco history on file. He reports that  drinks alcohol. He reports that he uses illicit drugs (Marijuana).   ROS:  Please see the history of present illness.   He denies dysphagia or odynophagia. He notes a nonproductive cough.   All other systems reviewed and negative.   PHYSICAL EXAM: VS:  BP 119/86  Pulse 90  Ht 5\' 11"  (1.803 m)  Wt 187 lb 12.8 oz (85.186 kg)  BMI 26.2 kg/m2 Well nourished, well developed, in no acute distress HEENT: normal Neck: no JVD Cardiac:  normal S1, S2; RRR; no murmur Lungs:  clear to auscultation bilaterally, no wheezing, rhonchi or rales Abd: soft, nontender, no hepatomegaly Ext: no edema Skin: warm and dry Neuro:  CNs 2-12 intact, no focal abnormalities noted      ASSESSMENT AND PLAN:  1. Non-Ischemic CM:  Volume appears stable. Continue lisinopril. Increase carvedilol to 6.25 mg twice daily.  We discussed the importance of medication compliance. 2. Hypertension:  Controlled. 3. Hyperlipidemia:  Continue statin. Check  lipids and LFTs in 4 weeks. 4. Chest Pain: I suspect he is describing dyspepsia. I have recommended he try Pepcid OTC twice daily. 5. Disposition:  Follow up with Dr. Eden Emms in 4 weeks.  Signed, Tereso Newcomer, PA-C  12/16/2012 2:12 PM

## 2012-12-16 NOTE — Patient Instructions (Addendum)
INCREASE COREG TO 6.25 MG TWICE DAILY; NEW RX WAS SENT IN FOR THE 6.25 MG TABLET  START OTC PEPCID 20 MG 1 TABLET TWICE DAILY  AN RX FOR NITROGLYCERIN HAS BEEN SENT IN FOR YOU TODAY AND YOU HAVE BEEN ADVISED AS TO HOW AND WHEN TO USE NTG  FASTING LIPID AND LIVER PANEL TO BE DONE IN 4 WEEKS  PLEASE FOLLOW UP WITH DR. Eden Emms IN 1 MONTH  WHEN YOU HAVE A COLD YOU CAN TAKE AN OTC MEDICATION NAMED CORICIDIN OK PER SCOTT WEAVER, PAC

## 2013-01-05 ENCOUNTER — Encounter: Payer: Self-pay | Admitting: Internal Medicine

## 2013-01-05 ENCOUNTER — Ambulatory Visit: Payer: Self-pay | Attending: Internal Medicine | Admitting: Internal Medicine

## 2013-01-05 VITALS — BP 127/92 | HR 84 | Temp 99.3°F | Resp 14 | Ht 71.0 in | Wt 191.0 lb

## 2013-01-05 DIAGNOSIS — Z79899 Other long term (current) drug therapy: Secondary | ICD-10-CM | POA: Insufficient documentation

## 2013-01-05 DIAGNOSIS — I10 Essential (primary) hypertension: Secondary | ICD-10-CM | POA: Insufficient documentation

## 2013-01-05 DIAGNOSIS — E109 Type 1 diabetes mellitus without complications: Secondary | ICD-10-CM | POA: Insufficient documentation

## 2013-01-05 DIAGNOSIS — Z09 Encounter for follow-up examination after completed treatment for conditions other than malignant neoplasm: Secondary | ICD-10-CM | POA: Insufficient documentation

## 2013-01-05 DIAGNOSIS — Z794 Long term (current) use of insulin: Secondary | ICD-10-CM | POA: Insufficient documentation

## 2013-01-05 DIAGNOSIS — Z7982 Long term (current) use of aspirin: Secondary | ICD-10-CM | POA: Insufficient documentation

## 2013-01-05 DIAGNOSIS — E785 Hyperlipidemia, unspecified: Secondary | ICD-10-CM | POA: Insufficient documentation

## 2013-01-05 DIAGNOSIS — I428 Other cardiomyopathies: Secondary | ICD-10-CM | POA: Insufficient documentation

## 2013-01-05 NOTE — Progress Notes (Signed)
Pt here for insulin change due to cost. Pt is currently using Novolog with sliding scale. States he missed benefit  enrollment at employment. Last CBG 208

## 2013-01-05 NOTE — Patient Instructions (Signed)

## 2013-01-05 NOTE — Progress Notes (Signed)
Patient ID: Jason Stevenson, male   DOB: 1986-02-18, 27 y.o.   MRN: [id number]  CC: follow up  HPI: Pt is 27 yo male with DM type I, presents to clinic for follow up and needs refill on medications. He denies chest pain or shortness of breath, no specific abdominal or urinary concerns, no fevers, chills, no other systemic concerns, He reports checking sugar levels regularly but for got to bring the log book with him.  No Known Allergies Past Medical History  Diagnosis Date  . Diabetes mellitus without complication   . Hypertension   . Nonischemic cardiomyopathy Noted as early as 2007    Per chart review (cards consult note 2011), EF of 40% in 2007, down to 20-25% in 2011  . Hyperlipidemia    Current Outpatient Prescriptions on File Prior to Visit  Medication Sig Dispense Refill  . aspirin 81 MG chewable tablet Chew 1 tablet (81 mg total) by mouth daily.  30 tablet  30  . atorvastatin (LIPITOR) 40 MG tablet Take 1 tablet (40 mg total) by mouth daily at 6 PM.  30 tablet  3  . carvedilol (COREG) 6.25 MG tablet Take 1 tablet (6.25 mg total) by mouth 2 (two) times daily with a meal.  60 tablet  11  . insulin aspart (NOVOLOG) 100 UNIT/ML injection Inject 10-50 Units into the skin 3 (three) times daily with meals.  1 vial  12  . insulin glargine (LANTUS) 100 UNIT/ML injection Inject 0.25 mLs (25 Units total) into the skin at bedtime.  10 mL  12  . lisinopril (PRINIVIL,ZESTRIL) 2.5 MG tablet Take 1 tablet (2.5 mg total) by mouth daily.  30 tablet  5  . famotidine (PEPCID) 20 MG tablet Take 1 tablet (20 mg total) by mouth 2 (two) times daily.      . Insulin Syringes, Disposable, U-100 1 ML MISC Insulin syringes 30-gauge  100 each  12  . nitroGLYCERIN (NITROSTAT) 0.4 MG SL tablet Place 1 tablet (0.4 mg total) under the tongue every 5 (five) minutes as needed for chest pain.  50 tablet  3  . nitroGLYCERIN (NITROSTAT) 0.4 MG SL tablet Place 1 tablet (0.4 mg total) under the tongue every 5 (five) minutes  as needed for chest pain.  25 tablet  2   No current facility-administered medications on file prior to visit.   Family History  Problem Relation Age of Onset  . Diabetes Mother    History   Social History  . Marital Status: Single    Spouse Name: N/A    Number of Children: N/A  . Years of Education: N/A   Occupational History  . [employer]    Social History Main Topics  . Smoking status: Never Smoker   . Smokeless tobacco: Not on file  . Alcohol Use: Yes     Comment: "occasional" when "hanging out with the wrong people" No recent use.  . Drug Use: Yes    Special: Marijuana     Comment: Last in 2013  . Sexual Activity: Not on file   Other Topics Concern  . Not on file   Social History Narrative   Pt lives alone. Has no information on father.    Review of Systems  Constitutional: Negative for fever, chills, diaphoresis, activity change, appetite change and fatigue.  HENT: Negative for ear pain, nosebleeds, congestion, facial swelling, rhinorrhea, neck pain, neck stiffness and ear discharge.   Eyes: Negative for pain, discharge, redness, itching and visual disturbance.  Respiratory:  Negative for cough, choking, chest tightness, shortness of breath, wheezing and stridor.   Cardiovascular: Negative for chest pain, palpitations and leg swelling.  Gastrointestinal: Negative for abdominal distention.  Genitourinary: Negative for dysuria, urgency, frequency, hematuria, flank pain, decreased urine volume, difficulty urinating and dyspareunia.  Musculoskeletal: Negative for back pain, joint swelling, arthralgias and gait problem.  Neurological: Negative for dizziness, tremors, seizures, syncope, facial asymmetry, speech difficulty, weakness, light-headedness, numbness and headaches.  Hematological: Negative for adenopathy. Does not bruise/bleed easily.  Psychiatric/Behavioral: Negative for hallucinations, behavioral problems, confusion, dysphoric mood, decreased  concentration and agitation.    Objective:   Filed Vitals:   01/05/13 0917  BP: 127/92  Pulse: 84  Temp: 99.3 F (37.4 C)  Resp: 14    Physical Exam  Constitutional: Appears well-developed and well-nourished. No distress.  CVS: RRR, S1/S2 +, no murmurs, no gallops, no carotid bruit.  Pulmonary: Effort and breath sounds normal, no stridor, rhonchi, wheezes, rales.  Abdominal: Soft. BS +,  no distension, tenderness, rebound or guarding.    Lab Results  Component Value Date   WBC 4.2 11/05/2012   HGB 15.7 11/05/2012   HCT 44.7 11/05/2012   MCV 82.9 11/05/2012   PLT 235 11/05/2012   Lab Results  Component Value Date   CREATININE 0.91 11/06/2012   BUN 14 11/06/2012   NA 137 11/06/2012   K 4.3 11/06/2012   CL 98 11/06/2012   CO2 26 11/06/2012    Lab Results  Component Value Date   HGBA1C 11.4* 11/05/2012   Lipid Panel     Component Value Date/Time   CHOL 360* 11/06/2012 0535   TRIG 326* 11/06/2012 0535   HDL 39* 11/06/2012 0535   CHOLHDL 9.2 11/06/2012 0535   VLDL 65* 11/06/2012 0535   LDLCALC 256* 11/06/2012 0535       Assessment and plan:   Patient Active Problem List   Diagnosis Date Noted  . Type I or unspecified type diabetes mellitus - continue Lantus and Novolog, scripts provided, education on regular sugar checks provided and pt has verbalized understanding. I have made him aware we need A1C in one month.   11/20/2012  . HTN (hypertension) - reasonable control, regular monitoring advised  11/06/2012  . HLD (hyperlipidemia) - will need lipid panel check in one month, continue statin for now  11/06/2012

## 2013-01-19 ENCOUNTER — Other Ambulatory Visit (INDEPENDENT_AMBULATORY_CARE_PROVIDER_SITE_OTHER): Payer: Self-pay

## 2013-01-19 ENCOUNTER — Encounter: Payer: Self-pay | Admitting: *Deleted

## 2013-01-19 DIAGNOSIS — E785 Hyperlipidemia, unspecified: Secondary | ICD-10-CM

## 2013-01-19 LAB — HEPATIC FUNCTION PANEL
Albumin: 4 g/dL (ref 3.5–5.2)
Alkaline Phosphatase: 61 U/L (ref 39–117)
Bilirubin, Direct: 0 mg/dL (ref 0.0–0.3)

## 2013-01-19 LAB — LIPID PANEL
LDL Cholesterol: 116 mg/dL — ABNORMAL HIGH (ref 0–99)
Total CHOL/HDL Ratio: 4

## 2013-01-22 ENCOUNTER — Ambulatory Visit: Payer: Self-pay | Admitting: Cardiovascular Disease

## 2013-02-03 ENCOUNTER — Encounter: Payer: Self-pay | Admitting: Internal Medicine

## 2013-02-03 ENCOUNTER — Ambulatory Visit: Payer: Self-pay | Attending: Internal Medicine | Admitting: Internal Medicine

## 2013-02-03 VITALS — BP 131/86 | HR 83 | Temp 97.6°F | Resp 16 | Ht 71.0 in | Wt 197.0 lb

## 2013-02-03 DIAGNOSIS — E119 Type 2 diabetes mellitus without complications: Secondary | ICD-10-CM | POA: Insufficient documentation

## 2013-02-03 DIAGNOSIS — E785 Hyperlipidemia, unspecified: Secondary | ICD-10-CM

## 2013-02-03 DIAGNOSIS — IMO0001 Reserved for inherently not codable concepts without codable children: Secondary | ICD-10-CM

## 2013-02-03 DIAGNOSIS — I10 Essential (primary) hypertension: Secondary | ICD-10-CM | POA: Insufficient documentation

## 2013-02-03 LAB — HEMOGLOBIN A1C: Mean Plasma Glucose: 266 mg/dL — ABNORMAL HIGH (ref <117)

## 2013-02-03 NOTE — Progress Notes (Signed)
Patient ID: Jason Stevenson, male   DOB: [birth year], 27 y.o.   MRN: [id number]  CC: Followup  HPI: 27 year old pleasant male with past medical history significant for cardiomyopathy, dyslipidemia, diabetes and hypertension who presented to clinic for followup. Patient reports of increasing fatigue over past couple of days and being very jittery at nighttime. Patient reports no chest pain or shortness of breath. No palpitations. He reports no exertional dyspnea. No abdominal pain.  No Known Allergies Past Medical History  Diagnosis Date  . Diabetes mellitus without complication   . Hypertension   . Nonischemic cardiomyopathy Noted as early as 2007    Per chart review (cards consult note 2011), EF of 40% in 2007, down to 20-25% in 2011  . Hyperlipidemia    Current Outpatient Prescriptions on File Prior to Visit  Medication Sig Dispense Refill  . aspirin 81 MG chewable tablet Chew 1 tablet (81 mg total) by mouth daily.  30 tablet  30  . atorvastatin (LIPITOR) 40 MG tablet Take 1 tablet (40 mg total) by mouth daily at 6 PM.  30 tablet  3  . carvedilol (COREG) 6.25 MG tablet Take 1 tablet (6.25 mg total) by mouth 2 (two) times daily with a meal.  60 tablet  11  . insulin aspart (NOVOLOG) 100 UNIT/ML injection Inject 10-50 Units into the skin 3 (three) times daily with meals.  1 vial  12  . insulin glargine (LANTUS) 100 UNIT/ML injection Inject 0.25 mLs (25 Units total) into the skin at bedtime.  10 mL  12  . Insulin Syringes, Disposable, U-100 1 ML MISC Insulin syringes 30-gauge  100 each  12  . lisinopril (PRINIVIL,ZESTRIL) 2.5 MG tablet Take 1 tablet (2.5 mg total) by mouth daily.  30 tablet  5  . nitroGLYCERIN (NITROSTAT) 0.4 MG SL tablet Place 1 tablet (0.4 mg total) under the tongue every 5 (five) minutes as needed for chest pain.  25 tablet  2  . famotidine (PEPCID) 20 MG tablet Take 1 tablet (20 mg total) by mouth 2 (two) times daily.       No current facility-administered medications on  file prior to visit.   Family History  Problem Relation Age of Onset  . Diabetes Mother    History   Social History  . Marital Status: Single    Spouse Name: N/A    Number of Children: N/A  . Years of Education: N/A   Occupational History  . [employer]    Social History Main Topics  . Smoking status: Never Smoker   . Smokeless tobacco: Not on file  . Alcohol Use: Yes     Comment: "occasional" when "hanging out with the wrong people" No recent use.  . Drug Use: Yes    Special: Marijuana     Comment: Last in 2013  . Sexual Activity: Not on file   Other Topics Concern  . Not on file   Social History Narrative   Pt lives alone. Has no information on father.    Review of Systems  Constitutional: Negative for fever, chills, diaphoresis, activity change, appetite change and positive for fatigue.  HENT: Negative for ear pain, nosebleeds, congestion, facial swelling, rhinorrhea, neck pain, neck stiffness and ear discharge.   Eyes: Negative for pain, discharge, redness, itching and visual disturbance.  Respiratory: Negative for cough, choking, chest tightness, shortness of breath, wheezing and stridor.   Cardiovascular: Negative for chest pain, palpitations and leg swelling.  Gastrointestinal: Negative for abdominal distention.  Genitourinary: Negative  for dysuria, urgency, frequency, hematuria, flank pain, decreased urine volume, difficulty urinating and dyspareunia.  Musculoskeletal: Negative for back pain, joint swelling, arthralgias and gait problem.  Neurological: Negative for dizziness, tremors, seizures, syncope, facial asymmetry, speech difficulty, weakness, light-headedness, numbness and headaches.  Hematological: Negative for adenopathy. Does not bruise/bleed easily.  Psychiatric/Behavioral: Negative for hallucinations, behavioral problems, confusion, dysphoric mood, decreased concentration and agitation.    Objective:   Filed Vitals:   02/03/13 1432  BP:  131/86  Pulse: 83  Temp: 97.6 F (36.4 C)  Resp: 16    Physical Exam  Constitutional: Appears well-developed and well-nourished. No distress.  HENT: Normocephalic. External right and left ear normal. Oropharynx is clear and moist.  Eyes: Conjunctivae and EOM are normal. PERRLA, no scleral icterus.  Neck: Normal ROM. Neck supple. No JVD. No tracheal deviation. No thyromegaly.  CVS: RRR, S1/S2 +, no murmurs, no gallops, no carotid bruit.  Pulmonary: Effort and breath sounds normal, no stridor, rhonchi, wheezes, rales.  Abdominal: Soft. BS +,  no distension, tenderness, rebound or guarding.  Musculoskeletal: Normal range of motion. No edema and no tenderness.  Lymphadenopathy: No lymphadenopathy noted, cervical, inguinal. Neuro: Alert. Normal reflexes, muscle tone coordination. No cranial nerve deficit. Skin: Skin is warm and dry. No rash noted. Not diaphoretic. No erythema. No pallor.  Psychiatric: Normal mood and affect. Behavior, judgment, thought content normal.   Lab Results  Component Value Date   WBC 4.2 11/05/2012   HGB 15.7 11/05/2012   HCT 44.7 11/05/2012   MCV 82.9 11/05/2012   PLT 235 11/05/2012   Lab Results  Component Value Date   CREATININE 0.91 11/06/2012   BUN 14 11/06/2012   NA 137 11/06/2012   K 4.3 11/06/2012   CL 98 11/06/2012   CO2 26 11/06/2012    Lab Results  Component Value Date   HGBA1C 11.4* 11/05/2012   Lipid Panel     Component Value Date/Time   CHOL 187 01/19/2013 0830   TRIG 112.0 01/19/2013 0830   HDL 48.40 01/19/2013 0830   CHOLHDL 4 01/19/2013 0830   VLDL 22.4 01/19/2013 0830   LDLCALC 116* 01/19/2013 0830       Assessment and plan:   Patient Active Problem List   Diagnosis Date Noted  . Type II or unspecified type diabetes mellitus  11/20/2012    Priority: Medium - Check A1c today  - Continue current insulin regimen   . HTN (hypertension) 11/06/2012    Priority: Medium - Blood pressure okay this visit - We have discussed target BP  range - I have advised pt to check BP regularly and to call us back if the numbers are higher than 140/90 - discussed the importance of compliance with medical therapy and diet  - continue corge, lisinopril  . HLD (hyperlipidemia) 11/06/2012    Priority: Medium - continue atorvastatin  . Non-ischemic cardiomyopathy - follows with Dr. Charlton Haws of cardiology 11/06/2012

## 2013-02-03 NOTE — Patient Instructions (Signed)
Hypertension As your heart beats, it forces blood through your arteries. This force is your blood pressure. If the pressure is too high, it is called hypertension (HTN) or high blood pressure. HTN is dangerous because you may have it and not know it. High blood pressure may mean that your heart has to work harder to pump blood. Your arteries may be narrow or stiff. The extra work puts you at risk for heart disease, stroke, and other problems.  Blood pressure consists of two numbers, a higher number over a lower, 110/72, for example. It is stated as "110 over 72." The ideal is below 120 for the top number (systolic) and under 80 for the bottom (diastolic). Write down your blood pressure today. You should pay close attention to your blood pressure if you have certain conditions such as:  Heart failure.  Prior heart attack.  Diabetes  Chronic kidney disease.  Prior stroke.  Multiple risk factors for heart disease. To see if you have HTN, your blood pressure should be measured while you are seated with your arm held at the level of the heart. It should be measured at least twice. A one-time elevated blood pressure reading (especially in the Emergency Department) does not mean that you need treatment. There may be conditions in which the blood pressure is different between your right and left arms. It is important to see your caregiver soon for a recheck. Most people have essential hypertension which means that there is not a specific cause. This type of high blood pressure may be lowered by changing lifestyle factors such as:  Stress.  Smoking.  Lack of exercise.  Excessive weight.  Drug/tobacco/alcohol use.  Eating less salt. Most people do not have symptoms from high blood pressure until it has caused damage to the body. Effective treatment can often prevent, delay or reduce that damage. TREATMENT  When a cause has been identified, treatment for high blood pressure is directed at the  cause. There are a large number of medications to treat HTN. These fall into several categories, and your caregiver will help you select the medicines that are best for you. Medications may have side effects. You should review side effects with your caregiver. If your blood pressure stays high after you have made lifestyle changes or started on medicines,   Your medication(s) may need to be changed.  Other problems may need to be addressed.  Be certain you understand your prescriptions, and know how and when to take your medicine.  Be sure to follow up with your caregiver within the time frame advised (usually within two weeks) to have your blood pressure rechecked and to review your medications.  If you are taking more than one medicine to lower your blood pressure, make sure you know how and at what times they should be taken. Taking two medicines at the same time can result in blood pressure that is too low. SEEK IMMEDIATE MEDICAL CARE IF:  You develop a severe headache, blurred or changing vision, or confusion.  You have unusual weakness or numbness, or a faint feeling.  You have severe chest or abdominal pain, vomiting, or breathing problems. MAKE SURE YOU:   Understand these instructions.  Will watch your condition.  Will get help right away if you are not doing well or get worse. Document Released: 04/01/2005 Document Revised: 06/24/2011 Document Reviewed: 11/20/2007 Exit[employer] Patient Information 2014 [employer]. Diabetes, Frequently Asked Questions WHAT IS DIABETES? Most of the food we eat is turned into   glucose (sugar). Our bodies use it for energy. The pancreas makes a hormone called insulin. It helps glucose get into the cells of our bodies. When you have diabetes, your body either does not make enough insulin or cannot use its own insulin as well as it should. This causes sugars to build up in your blood. WHAT ARE THE SYMPTOMS OF DIABETES?  Frequent  urination.  Excessive thirst.  Unexplained weight loss.  Extreme hunger.  Blurred vision.  Tingling or numbness in hands or feet.  Feeling very tired much of the time.  Dry, itchy skin.  Sores that are slow to heal.  Yeast infections. WHAT ARE THE TYPES OF DIABETES? Type 1 Diabetes   About 10% of affected people have this type.  Usually occurs before the age of 30.  Usually occurs in thin to normal weight people. Type 2 Diabetes  About 90% of affected people have this type.  Usually occurs after the age of 40.  Usually occurs in overweight people.  More likely to have:  A family history of diabetes.  A history of diabetes during pregnancy (gestational diabetes).  High blood pressure.  High cholesterol and triglycerides. Gestational Diabetes  Occurs in about 4% of pregnancies.  Usually goes away after the baby is born.  More likely to occur in women with:  Family history of diabetes.  Previous gestational diabetes.  Obese.  Over 25 years old. WHAT IS PRE-DIABETES? Pre-diabetes means your blood glucose is higher than normal, but lower than the diabetes range. It also means you are at risk of getting type 2 diabetes and heart disease. If you are told you have pre-diabetes, have your blood glucose checked again in 1 to 2 years. WHAT IS THE TREATMENT FOR DIABETES? Treatment is aimed at keeping blood glucose near normal levels at all times. Learning how to manage this yourself is important in treating diabetes. Depending on the type of diabetes you have, your treatment will include one or more of the following:  Monitoring your blood glucose.  Meal planning.  Exercise.  Oral medicine (pills) or insulin. CAN DIABETES BE PREVENTED? With type 1 diabetes, prevention is more difficult, because the triggers that cause it are not yet known. With type 2 diabetes, prevention is more likely, with lifestyle changes:  Maintain a healthy weight.  Eat  healthy.  Exercise. IS THERE A CURE FOR DIABETES? No, there is no cure for diabetes. There is a lot of research going on that is looking for a cure, and progress is being made. Diabetes can be treated and controlled. People with diabetes can manage their diabetes and lead normal, active lives. SHOULD I BE TESTED FOR DIABETES? If you are at least 29 years old, you should be tested for diabetes. You should be tested again every 3 years. If you are 45 or older and overweight, you may want to get tested more often. If you are younger than 45, overweight, and have one or more of the following risk factors, you should be tested:  Family history of diabetes.  Inactive lifestyle.  High blood pressure. WHAT ARE SOME OTHER SOURCES FOR INFORMATION ON DIABETES? The following organizations may help in your search for more information on diabetes: National Diabetes Education Program (NDEP) Internet: http://www.ndep.nih.gov/resources American Diabetes Association Internet: http://www.diabetes.org  Juvenile Diabetes Foundation International Internet: http://www.jdf.org Document Released: 04/04/2003 Document Revised: 06/24/2011 Document Reviewed: 01/27/2009 Exit[employer] Patient Information 2014 [employer].  

## 2013-02-03 NOTE — Addendum Note (Signed)
Addended by: Allayne Stack R on: 02/03/2013 02:51 PM   Modules accepted: Orders

## 2013-02-03 NOTE — Progress Notes (Signed)
Pt is here today following up for his diabetes.  Pt reports feeling fatigue and jumping in his sleep. Pt needs his medications refilled.

## 2013-02-12 ENCOUNTER — Ambulatory Visit: Payer: Self-pay

## 2013-02-12 ENCOUNTER — Telehealth: Payer: Self-pay | Admitting: Internal Medicine

## 2013-02-12 ENCOUNTER — Encounter (INDEPENDENT_AMBULATORY_CARE_PROVIDER_SITE_OTHER): Payer: Self-pay

## 2013-02-12 ENCOUNTER — Encounter: Payer: Self-pay | Admitting: *Deleted

## 2013-02-12 ENCOUNTER — Encounter: Payer: Self-pay | Admitting: Cardiovascular Disease

## 2013-02-12 ENCOUNTER — Ambulatory Visit (INDEPENDENT_AMBULATORY_CARE_PROVIDER_SITE_OTHER): Payer: Self-pay | Admitting: Cardiovascular Disease

## 2013-02-12 VITALS — BP 142/86 | HR 83 | Ht 71.0 in | Wt 196.0 lb

## 2013-02-12 DIAGNOSIS — IMO0001 Reserved for inherently not codable concepts without codable children: Secondary | ICD-10-CM

## 2013-02-12 DIAGNOSIS — I428 Other cardiomyopathies: Secondary | ICD-10-CM

## 2013-02-12 DIAGNOSIS — I10 Essential (primary) hypertension: Secondary | ICD-10-CM

## 2013-02-12 NOTE — Telephone Encounter (Signed)
Pt given lab results 

## 2013-02-12 NOTE — Telephone Encounter (Signed)
Paperwork will be given to Dr. Hyman Hopes

## 2013-02-12 NOTE — Assessment & Plan Note (Signed)
Functional class one Euvolemic f/u echo in 6 months for EF

## 2013-02-12 NOTE — Telephone Encounter (Signed)
Pt dropped off FMLA paperwork to be filled, pt will pick up when ready

## 2013-02-12 NOTE — Assessment & Plan Note (Signed)
Poorly controlled Discussed low carb diet F/U community health

## 2013-02-12 NOTE — Progress Notes (Signed)
Patient ID: Jason Stevenson, male   DOB: 10-Mar-1986, 27 y.o.   MRN: [id number] Jason Stevenson is a 27 y.o. male who returns for follow up after a recent admission to the hospital with CP 7/24-7/25. He has a hx of nonischemic cardiomyopathy, diabetes mellitus, HTN, HL. Cardiac MRI 04/2006: EF 45%, no hyper-enhancement. He has been lost to follow up. Recently seen in the hospital after presenting with chest pain. Myocardial infarction was ruled out by enzymes. ECG demonstrated anterolateral T wave inversions. Beta blocker and ACE inhibitor therapy was restarted. Of note, the patient has a history of noncompliance with medications and follow up. Echocardiogram 11/06/12: EF 20-25%, diffuse HK, mild MR. Cardiac CTA 7/14: Calcium score 0, normal coronary arteries.  He has occasional chest pain. He thinks that this is related to meals. He denies significant dyspnea with exertion. He is NYHA class II. He denies orthopnea, PND or edema. He denies syncope.  Recent labs show BS poorly controlled Needs f/u with community health Chol ok    ROS: Denies fever, malais, weight loss, blurry vision, decreased visual acuity, cough, sputum, SOB, hemoptysis, pleuritic pain, palpitaitons, heartburn, abdominal pain, melena, lower extremity edema, claudication, or rash.  All other systems reviewed and negative  General: Affect appropriate Healthy:  appears stated age HEENT: normal Neck supple with no adenopathy JVP normal no bruits no thyromegaly Lungs clear with no wheezing and good diaphragmatic motion Heart:  S1/S2 no murmur, no rub, gallop or click PMI normal Abdomen: benighn, BS positve, no tenderness, no AAA no bruit.  No HSM or HJR Distal pulses intact with no bruits No edema Neuro non-focal Skin warm and dry No muscular weakness   Current Outpatient Prescriptions  Medication Sig Dispense Refill  . aspirin 81 MG chewable tablet Chew 1 tablet (81 mg total) by mouth daily.  30 tablet  30  . atorvastatin  (LIPITOR) 40 MG tablet Take 1 tablet (40 mg total) by mouth daily at 6 PM.  30 tablet  3  . carvedilol (COREG) 6.25 MG tablet Take 1 tablet (6.25 mg total) by mouth 2 (two) times daily with a meal.  60 tablet  11  . insulin aspart (NOVOLOG) 100 UNIT/ML injection Inject 10-50 Units into the skin 3 (three) times daily with meals.  1 vial  12  . insulin glargine (LANTUS) 100 UNIT/ML injection Inject 0.25 mLs (25 Units total) into the skin at bedtime.  10 mL  12  . Insulin Syringes, Disposable, U-100 1 ML MISC Insulin syringes 30-gauge  100 each  12  . lisinopril (PRINIVIL,ZESTRIL) 2.5 MG tablet Take 1 tablet (2.5 mg total) by mouth daily.  30 tablet  5  . nitroGLYCERIN (NITROSTAT) 0.4 MG SL tablet Place 1 tablet (0.4 mg total) under the tongue every 5 (five) minutes as needed for chest pain.  25 tablet  2   No current facility-administered medications for this visit.    Allergies  Review of patient's allergies indicates no known allergies.  Electrocardiogram: 11/07/12 SR rate 88 lateral T wave changes   Assessment and Plan

## 2013-02-12 NOTE — Assessment & Plan Note (Signed)
Well controlled.  Continue current medications and low sodium Dash type diet.    

## 2013-02-12 NOTE — Patient Instructions (Signed)
Your physician wants you to follow-up in:   6 MONTHS WITH  ECHO SAME DAY  You will receive a reminder letter in the mail two months in advance. If you don't receive a letter, please call our office to schedule the follow-up appointment. Your physician recommends that you continue on your current medications as directed. Please refer to the Current Medication list given to you today. Your physician has requested that you have an echocardiogram. Echocardiography is a painless test that uses sound waves to create images of your heart. It provides your doctor with information about the size and shape of your heart and how well your heart's chambers and valves are working. This procedure takes approximately one hour. There are no restrictions for this procedure. IN  6 MONTHS

## 2013-03-08 ENCOUNTER — Ambulatory Visit: Payer: Self-pay | Admitting: Internal Medicine

## 2013-03-10 ENCOUNTER — Ambulatory Visit: Payer: Self-pay

## 2013-03-22 ENCOUNTER — Other Ambulatory Visit: Payer: Self-pay | Admitting: Internal Medicine

## 2013-03-22 ENCOUNTER — Telehealth: Payer: Self-pay | Admitting: Internal Medicine

## 2013-03-22 ENCOUNTER — Ambulatory Visit: Payer: Self-pay | Attending: Internal Medicine | Admitting: Internal Medicine

## 2013-03-22 ENCOUNTER — Encounter: Payer: Self-pay | Admitting: Internal Medicine

## 2013-03-22 VITALS — BP 137/85 | HR 75 | Temp 99.2°F | Resp 16 | Ht 71.0 in | Wt 200.0 lb

## 2013-03-22 DIAGNOSIS — I10 Essential (primary) hypertension: Secondary | ICD-10-CM | POA: Insufficient documentation

## 2013-03-22 DIAGNOSIS — E119 Type 2 diabetes mellitus without complications: Secondary | ICD-10-CM | POA: Insufficient documentation

## 2013-03-22 MED ORDER — INSULIN NPH (HUMAN) (ISOPHANE) 100 UNIT/ML ~~LOC~~ SUSP: 25.0000 [IU] | Freq: Every day | SUBCUTANEOUS | Status: DC

## 2013-03-22 MED ORDER — METFORMIN HCL 500 MG PO TABS: 500.0000 mg | ORAL_TABLET | Freq: Two times a day (BID) | ORAL | Status: DC

## 2013-03-22 NOTE — Progress Notes (Signed)
Pt is here following up on his diabetes. He claims that his sugar is high which is causing him to feel bad today.

## 2013-03-22 NOTE — Telephone Encounter (Signed)
Returned pt's call to reschedule appt, LVM.

## 2013-03-22 NOTE — Progress Notes (Signed)
Patient ID: Jason Stevenson, male   DOB: November 04, 1985, 27 y.o.   MRN: [id number] Patient Demographics  Jason Stevenson, is a 27 y.o. male  [id number]  [id number]  DOB - Aug 04, 1985  Chief Complaint  Patient presents with  . Follow-up        Subjective:   Jason Stevenson is a 27 y.o. male here today for a follow up visit. He has hx of nonischemic cardiomyopathy, diabetes mellitus, HTN, HL. Cardiac MRI 04/2006: EF 45%, no hyper-enhancement.  Recently seen in the hospital after presenting with chest pain. Myocardial infarction was ruled out by enzymes. ECG demonstrated anterolateral T wave inversions. Beta blocker and ACE inhibitor therapy were restarted. Of note, the patient has a history of noncompliance with medications and follow up. Echocardiogram 11/06/12: EF 20-25%, diffuse HK, mild MR. Cardiac CTA 7/14: Calcium score 0, normal coronary arteries. Patient is here today for refill of metformin, still waiting for her Lantus insulin from the medication assistance program. No new complaints today. Patient has not been to ophthalmologist for diabetic retinopathy screening. Urinary output is good. No change in bowel habit. Patient has No headache, No chest pain, No abdominal pain - No Nausea, No new weakness tingling or numbness, No Cough - SOB.  ALLERGIES: No Known Allergies  PAST MEDICAL HISTORY: Past Medical History  Diagnosis Date  . Diabetes mellitus without complication   . Hypertension   . Nonischemic cardiomyopathy Noted as early as 2007    Per chart review (cards consult note 2011), EF of 40% in 2007, down to 20-25% in 2011  . Hyperlipidemia     MEDICATIONS AT HOME: Prior to Admission medications   Medication Sig Start Date End Date Taking? Authorizing Provider  atorvastatin (LIPITOR) 40 MG tablet Take 1 tablet (40 mg total) by mouth daily at 6 PM. 12/07/12  Yes Richarda Overlie, MD  carvedilol (COREG) 6.25 MG tablet Take 1 tablet (6.25 mg total) by mouth 2 (two) times daily  with a meal. 12/16/12  Yes Scott T Alben Spittle, PA-C  insulin aspart (NOVOLOG) 100 UNIT/ML injection Inject 10-50 Units into the skin 3 (three) times daily with meals. 11/20/12  Yes Ripudeep Jenna Luo, MD  insulin glargine (LANTUS) 100 UNIT/ML injection Inject 0.25 mLs (25 Units total) into the skin at bedtime. 11/20/12  Yes Ripudeep Jenna Luo, MD  lisinopril (PRINIVIL,ZESTRIL) 2.5 MG tablet Take 1 tablet (2.5 mg total) by mouth daily. 12/07/12  Yes Richarda Overlie, MD  nitroGLYCERIN (NITROSTAT) 0.4 MG SL tablet Place 1 tablet (0.4 mg total) under the tongue every 5 (five) minutes as needed for chest pain. 12/16/12  Yes Beatrice Lecher, PA-C  aspirin 81 MG chewable tablet Chew 1 tablet (81 mg total) by mouth daily. 11/06/12   Jacquelin Hawking, MD  Insulin Syringes, Disposable, U-100 1 ML MISC Insulin syringes 30-gauge 11/20/12   Ripudeep Jenna Luo, MD  metFORMIN (GLUCOPHAGE) 500 MG tablet Take 1 tablet (500 mg total) by mouth 2 (two) times daily with a meal. 03/22/13   Jeanann Lewandowsky, MD     Objective:   Filed Vitals:   03/22/13 1224  BP: 137/85  Pulse: 75  Temp: 99.2 F (37.3 C)  TempSrc: Oral  Resp: 16  Height: 5\' 11"  (1.803 m)  Weight: 200 lb (90.719 kg)  SpO2: 95%    Exam General appearance : Awake, alert, not in any distress. Speech Clear. Not toxic looking HEENT: Atraumatic and Normocephalic, pupils equally reactive to light and accomodation Neck: supple, no JVD. No cervical lymphadenopathy.  Chest:Good air  entry bilaterally, no added sounds  CVS: S1 S2 regular, no murmurs.  Abdomen: Bowel sounds present, Non tender and not distended with no gaurding, rigidity or rebound. Extremities: B/L Lower Ext shows no edema, both legs are warm to touch Neurology: Awake alert, and oriented X 3, CN II-XII intact, Non focal Skin:No Rash Wounds:N/A   Data Review   CBC No results found for this basename: WBC, HGB, HCT, PLT, MCV, MCH, MCHC, RDW, NEUTRABS, LYMPHSABS, MONOABS, EOSABS, BASOSABS, BANDABS, BANDSABD,  in the  last 168 hours  Chemistries   No results found for this basename: NA, K, CL, CO2, GLUCOSE, BUN, CREATININE, GFRCGP, CALCIUM, MG, AST, ALT, ALKPHOS, BILITOT,  in the last 168 hours ------------------------------------------------------------------------------------------------------------------ No results found for this basename: HGBA1C,  in the last 72 hours ------------------------------------------------------------------------------------------------------------------ No results found for this basename: CHOL, HDL, LDLCALC, TRIG, CHOLHDL, LDLDIRECT,  in the last 72 hours ------------------------------------------------------------------------------------------------------------------ No results found for this basename: TSH, T4TOTAL, FREET3, T3FREE, THYROIDAB,  in the last 72 hours ------------------------------------------------------------------------------------------------------------------ No results found for this basename: VITAMINB12, FOLATE, FERRITIN, TIBC, IRON, RETICCTPCT,  in the last 72 hours  Coagulation profile  No results found for this basename: INR, PROTIME,  in the last 168 hours    Assessment & Plan   1. Diabetes Patient is still awaiting Lantus insulin from medication assistance program - Glucose (CBG) - metFORMIN (GLUCOPHAGE) 500 MG tablet; Take 1 tablet (500 mg total) by mouth 2 (two) times daily with a meal.  Dispense: 180 tablet; Refill: 3 - Ambulatory referral to Ophthalmology  2. HTN (hypertension) Continue lisinopril 2.5 mg tablet by mouth daily Continue carvedilol 6.25 milligrams tablet by mouth daily twice a day  Patient extensively counseled about compliance with medications Patient counseled about nutrition and exercise  Follow up in 3 months or when necessary   The patient was given clear instructions to go to ER or return to medical center if symptoms don't improve, worsen or new problems develop. The patient verbalized understanding. The  patient was told to call to get lab results if they haven't heard anything in the next week.    Jeanann Lewandowsky, MD, MHA, FACP, FAAP [facility] [address], [place] [phone]   03/22/2013, 1:11 PM

## 2013-03-22 NOTE — Patient Instructions (Signed)
Hypertension  As your heart beats, it forces blood through your arteries. This force is your blood pressure. If the pressure is too high, it is called hypertension (HTN) or high blood pressure. HTN is dangerous because you may have it and not know it. High blood pressure may mean that your heart has to work harder to pump blood. Your arteries may be narrow or stiff. The extra work puts you at risk for heart disease, stroke, and other problems.   Blood pressure consists of two numbers, a higher number over a lower, 110/72, for example. It is stated as "110 over 72." The ideal is below 120 for the top number (systolic) and under 80 for the bottom (diastolic). Write down your blood pressure today.  You should pay close attention to your blood pressure if you have certain conditions such as:  · Heart failure.  · Prior heart attack.  · Diabetes  · Chronic kidney disease.  · Prior stroke.  · Multiple risk factors for heart disease.  To see if you have HTN, your blood pressure should be measured while you are seated with your arm held at the level of the heart. It should be measured at least twice. A one-time elevated blood pressure reading (especially in the Emergency Department) does not mean that you need treatment. There may be conditions in which the blood pressure is different between your right and left arms. It is important to see your caregiver soon for a recheck.  Most people have essential hypertension which means that there is not a specific cause. This type of high blood pressure may be lowered by changing lifestyle factors such as:  · Stress.  · Smoking.  · Lack of exercise.  · Excessive weight.  · Drug/tobacco/alcohol use.  · Eating less salt.  Most people do not have symptoms from high blood pressure until it has caused damage to the body. Effective treatment can often prevent, delay or reduce that damage.  TREATMENT   When a cause has been identified, treatment for high blood pressure is directed at the  cause. There are a large number of medications to treat HTN. These fall into several categories, and your caregiver will help you select the medicines that are best for you. Medications may have side effects. You should review side effects with your caregiver.  If your blood pressure stays high after you have made lifestyle changes or started on medicines,   · Your medication(s) may need to be changed.  · Other problems may need to be addressed.  · Be certain you understand your prescriptions, and know how and when to take your medicine.  · Be sure to follow up with your caregiver within the time frame advised (usually within two weeks) to have your blood pressure rechecked and to review your medications.  · If you are taking more than one medicine to lower your blood pressure, make sure you know how and at what times they should be taken. Taking two medicines at the same time can result in blood pressure that is too low.  SEEK IMMEDIATE MEDICAL CARE IF:  · You develop a severe headache, blurred or changing vision, or confusion.  · You have unusual weakness or numbness, or a faint feeling.  · You have severe chest or abdominal pain, vomiting, or breathing problems.  MAKE SURE YOU:   · Understand these instructions.  · Will watch your condition.  · Will get help right away if you are not doing well   or get worse.  Document Released: 04/01/2005 Document Revised: 06/24/2011 Document Reviewed: 11/20/2007  ExitCare® Patient Information ©2014 [employer].    Diabetes and Exercise  Exercising regularly is important. It is not just about losing weight. It has many health benefits, such as:  · Improving your overall fitness, flexibility, and endurance.  · Increasing your bone density.  · Helping with weight control.  · Decreasing your body fat.  · Increasing your muscle strength.  · Reducing stress and tension.  · Improving your overall health.  People with diabetes who exercise gain additional benefits because  exercise:  · Reduces appetite.  · Improves the body's use of blood sugar (glucose).  · Helps lower or control blood glucose.  · Decreases blood pressure.  · Helps control blood lipids (such as cholesterol and triglycerides).  · Improves the body's use of the hormone insulin by:  · Increasing the body's insulin sensitivity.  · Reducing the body's insulin needs.  · Decreases the risk for heart disease because exercising:  · Lowers cholesterol and triglycerides levels.  · Increases the levels of good cholesterol (such as high-density lipoproteins [HDL]) in the body.  · Lowers blood glucose levels.  YOUR ACTIVITY PLAN   Choose an activity that you enjoy and set realistic goals. Your health care provider or diabetes educator can help you make an activity plan that works for you. You can break activities into 2 or 3 sessions throughout the day. Doing so is as good as one long session. Exercise ideas include:  · Taking the dog for a walk.  · Taking the stairs instead of the elevator.  · Dancing to your favorite song.  · Doing your favorite exercise with a friend.  RECOMMENDATIONS FOR EXERCISING WITH TYPE 1 OR TYPE 2 DIABETES   · Check your blood glucose before exercising. If blood glucose levels are greater than 240 mg/dL, check for urine ketones. Do not exercise if ketones are present.  · Avoid injecting insulin into areas of the body that are going to be exercised. For example, avoid injecting insulin into:  · The arms when playing tennis.  · The legs when jogging.  · Keep a record of:  · Food intake before and after you exercise.  · Expected peak times of insulin action.  · Blood glucose levels before and after you exercise.  · The type and amount of exercise you have done.  · Review your records with your health care provider. Your health care provider will help you to develop guidelines for adjusting food intake and insulin amounts before and after exercising.  · If you take insulin or oral hypoglycemic agents, watch  for signs and symptoms of hypoglycemia. They include:  · Dizziness.  · Shaking.  · Sweating.  · Chills.  · Confusion.  · Drink plenty of water while you exercise to prevent dehydration or heat stroke. Body water is lost during exercise and must be replaced.  · Talk to your health care provider before starting an exercise program to make sure it is safe for you. Remember, almost any type of activity is better than none.  Document Released: 06/22/2003 Document Revised: 12/02/2012 Document Reviewed: 09/08/2012  ExitCare® Patient Information ©2014 [employer].

## 2013-03-24 ENCOUNTER — Other Ambulatory Visit: Payer: Self-pay | Admitting: Emergency Medicine

## 2013-03-24 MED ORDER — INSULIN ASPART 100 UNIT/ML ~~LOC~~ SOLN: 10.0000 [IU] | Freq: Three times a day (TID) | SUBCUTANEOUS | Status: DC

## 2013-03-24 MED ORDER — INSULIN GLARGINE 100 UNIT/ML ~~LOC~~ SOLN: 25.0000 [IU] | Freq: Every day | SUBCUTANEOUS | Status: DC

## 2013-05-10 ENCOUNTER — Other Ambulatory Visit: Payer: Self-pay | Admitting: *Deleted

## 2013-05-10 MED ORDER — ATORVASTATIN CALCIUM 40 MG PO TABS: 40.0000 mg | ORAL_TABLET | Freq: Every day | ORAL | Status: DC

## 2013-06-09 ENCOUNTER — Telehealth: Payer: Self-pay | Admitting: Internal Medicine

## 2013-06-09 ENCOUNTER — Other Ambulatory Visit: Payer: Self-pay | Admitting: *Deleted

## 2013-06-09 DIAGNOSIS — E119 Type 2 diabetes mellitus without complications: Secondary | ICD-10-CM

## 2013-06-09 DIAGNOSIS — I10 Essential (primary) hypertension: Secondary | ICD-10-CM

## 2013-06-09 MED ORDER — INSULIN ASPART 100 UNIT/ML ~~LOC~~ SOLN: 10.0000 [IU] | Freq: Three times a day (TID) | SUBCUTANEOUS | Status: DC

## 2013-06-09 MED ORDER — LISINOPRIL 2.5 MG PO TABS: 2.5000 mg | ORAL_TABLET | Freq: Every day | ORAL | Status: DC

## 2013-06-09 NOTE — Telephone Encounter (Signed)
Pt called regarding a refill of his medication, please contact pt  °

## 2013-06-17 ENCOUNTER — Encounter: Payer: Self-pay | Admitting: Internal Medicine

## 2013-06-17 ENCOUNTER — Ambulatory Visit: Payer: BC Managed Care – PPO | Attending: Internal Medicine | Admitting: Internal Medicine

## 2013-06-17 VITALS — BP 133/86 | HR 83 | Temp 98.6°F | Resp 16 | Ht 71.0 in | Wt 199.0 lb

## 2013-06-17 DIAGNOSIS — Z09 Encounter for follow-up examination after completed treatment for conditions other than malignant neoplasm: Secondary | ICD-10-CM | POA: Insufficient documentation

## 2013-06-17 DIAGNOSIS — E119 Type 2 diabetes mellitus without complications: Secondary | ICD-10-CM | POA: Insufficient documentation

## 2013-06-17 DIAGNOSIS — E1165 Type 2 diabetes mellitus with hyperglycemia: Secondary | ICD-10-CM

## 2013-06-17 DIAGNOSIS — Z794 Long term (current) use of insulin: Secondary | ICD-10-CM | POA: Insufficient documentation

## 2013-06-17 DIAGNOSIS — Z7982 Long term (current) use of aspirin: Secondary | ICD-10-CM | POA: Insufficient documentation

## 2013-06-17 DIAGNOSIS — IMO0001 Reserved for inherently not codable concepts without codable children: Secondary | ICD-10-CM | POA: Insufficient documentation

## 2013-06-17 DIAGNOSIS — I428 Other cardiomyopathies: Secondary | ICD-10-CM | POA: Insufficient documentation

## 2013-06-17 DIAGNOSIS — I10 Essential (primary) hypertension: Secondary | ICD-10-CM | POA: Insufficient documentation

## 2013-06-17 DIAGNOSIS — E785 Hyperlipidemia, unspecified: Secondary | ICD-10-CM

## 2013-06-17 LAB — POCT GLYCOSYLATED HEMOGLOBIN (HGB A1C): HEMOGLOBIN A1C: 11.8

## 2013-06-17 LAB — GLUCOSE, POCT (MANUAL RESULT ENTRY): POC GLUCOSE: 262 mg/dL — AB (ref 70–99)

## 2013-06-17 MED ORDER — INSULIN ASPART 100 UNIT/ML ~~LOC~~ SOLN
10.0000 [IU] | Freq: Once | SUBCUTANEOUS | Status: AC
  Administered 2013-06-17: 10 [IU] via SUBCUTANEOUS

## 2013-06-17 MED ORDER — DAPAGLIFLOZIN PROPANEDIOL 5 MG PO TABS: 5.0000 mg | ORAL_TABLET | Freq: Every day | ORAL | Status: DC

## 2013-06-17 NOTE — Progress Notes (Signed)
Patient ID: Yazid Keetch, male   DOB: 16-Feb-1986, 28 y.o.   MRN: [id number]   Mikos Enter, is a 28 y.o. male  [id number]  [id number]:[id number]  DOB - [birth year]  Chief Complaint  Patient presents with  . Follow-up        Subjective:   Adrick Schneiders is a 28 y.o. male here today for a follow up visit. Patient is here for followup of his diabetes, dyslipidemia and hypertension. He has no major complaints. He is not very compliant with medications but he keeps saying he is trying. His blood sugar ranges between 180 and 200 at home according to patient but he doesn't check it regularly. He claims it's difficult to follow through with nutritional control. He does not smoke cigarette, he does not drink alcohol. Patient has No headache, No chest pain, No abdominal pain - No Nausea, No new weakness tingling or numbness, No Cough - SOB.  Problem  Diabetes    ALLERGIES: No Known Allergies  PAST MEDICAL HISTORY: Past Medical History  Diagnosis Date  . Diabetes mellitus without complication   . Hypertension   . Nonischemic cardiomyopathy Noted as early as 2007    Per chart review (cards consult note 2011), EF of 40% in 2007, down to 20-25% in 2011  . Hyperlipidemia     MEDICATIONS AT HOME: Prior to Admission medications   Medication Sig Start Date End Date Taking? Authorizing Provider  atorvastatin (LIPITOR) 40 MG tablet Take 1 tablet (40 mg total) by mouth daily at 6 PM. 05/10/13  Yes Wendall Stade, MD  carvedilol (COREG) 6.25 MG tablet Take 1 tablet (6.25 mg total) by mouth 2 (two) times daily with a meal. 12/16/12  Yes Beatrice Lecher, PA-C  insulin aspart (NOVOLOG) 100 UNIT/ML injection Inject 10-50 Units into the skin 3 (three) times daily with meals. 06/09/13  Yes Jeanann Lewandowsky, MD  Insulin Syringes, Disposable, U-100 1 ML MISC Insulin syringes 30-gauge 11/20/12  Yes Ripudeep K Rai, MD  lisinopril (PRINIVIL,ZESTRIL) 2.5 MG tablet Take 1 tablet (2.5 mg total) by mouth daily.  06/09/13  Yes Jeanann Lewandowsky, MD  metFORMIN (GLUCOPHAGE) 500 MG tablet Take 1 tablet (500 mg total) by mouth 2 (two) times daily with a meal. 03/22/13  Yes Jeanann Lewandowsky, MD  nitroGLYCERIN (NITROSTAT) 0.4 MG SL tablet Place 1 tablet (0.4 mg total) under the tongue every 5 (five) minutes as needed for chest pain. 12/16/12  Yes Beatrice Lecher, PA-C  aspirin 81 MG chewable tablet Chew 1 tablet (81 mg total) by mouth daily. 11/06/12   Jacquelin Hawking, MD  Dapagliflozin Propanediol (FARXIGA) 5 MG TABS Take 5 mg by mouth daily. 06/17/13   Jeanann Lewandowsky, MD  insulin NPH (HUMULIN N) 100 UNIT/ML injection Inject 25 Units into the skin at bedtime. 03/22/13   Alison Murray, MD     Objective:   Filed Vitals:   06/17/13 1234  BP: 133/86  Pulse: 83  Temp: 98.6 F (37 C)  TempSrc: Oral  Resp: 16  Height: 5\' 11"  (1.803 m)  Weight: 199 lb (90.266 kg)  SpO2: 97%    Exam General appearance : Awake, alert, not in any distress. Speech Clear. Not toxic looking HEENT: Atraumatic and Normocephalic, pupils equally reactive to light and accomodation Neck: supple, no JVD. No cervical lymphadenopathy.  Chest:Good air entry bilaterally, no added sounds  CVS: S1 S2 regular, no murmurs.  Abdomen: Bowel sounds present, Non tender and not distended with no gaurding, rigidity or rebound. Extremities: B/L Lower  Ext shows no edema, both legs are warm to touch Neurology: Awake alert, and oriented X 3, CN II-XII intact, Non focal Skin:No Rash Wounds:N/A  Data Review Lab Results  Component Value Date   HGBA1C 11.8 06/17/2013   HGBA1C 10.9* 02/03/2013   HGBA1C 11.4* 11/05/2012     Assessment & Plan   1. Diabetes, uncontrolled  - Glucose (CBG) - HgB A1c... he stopped 11.8% from 10.9% the last time Patient to continue daily insulin regimen and metformin 500 mg tablet by mouth twice a day, add Farxiga, check again 3 months - insulin aspart (novoLOG) injection 10 Units; Inject 10 Units into the skin once. -  Dapagliflozin Propanediol (FARXIGA) 5 MG TABS; Take 5 mg by mouth daily.  Dispense: 30 tablet; Refill: 3 - Ambulatory referral to Ophthalmology  2. HLD (hyperlipidemia)  Continue atorvastatin 40 mg tablet by mouth daily  Patient was extensively counseled on nutrition and exercise  Return for Hemoglobin A1C and Follow up.  The patient was given clear instructions to go to ER or return to medical center if symptoms don't improve, worsen or new problems develop. The patient verbalized understanding. The patient was told to call to get lab results if they haven't heard anything in the next week.    Jeanann LewandowskyJEGEDE, Caleb Decock, MD, MHA, FACP, FAAP [facility] [place], [place] [phone]   06/17/2013, 1:24 PM

## 2013-06-17 NOTE — Progress Notes (Signed)
Pt is here following up on the HTN and diabetes.

## 2013-06-17 NOTE — Patient Instructions (Signed)
Diabetes and Exercise Exercising regularly is important. It is not just about losing weight. It has many health benefits, such as:  Improving your overall fitness, flexibility, and endurance.  Increasing your bone density.  Helping with weight control.  Decreasing your body fat.  Increasing your muscle strength.  Reducing stress and tension.  Improving your overall health. People with diabetes who exercise gain additional benefits because exercise:  Reduces appetite.  Improves the body's use of blood sugar (glucose).  Helps lower or control blood glucose.  Decreases blood pressure.  Helps control blood lipids (such as cholesterol and triglycerides).  Improves the body's use of the hormone insulin by:  Increasing the body's insulin sensitivity.  Reducing the body's insulin needs.  Decreases the risk for heart disease because exercising:  Lowers cholesterol and triglycerides levels.  Increases the levels of good cholesterol (such as high-density lipoproteins [HDL]) in the body.  Lowers blood glucose levels. YOUR ACTIVITY PLAN  Choose an activity that you enjoy and set realistic goals. Your health care provider or diabetes educator can help you make an activity plan that works for you. You can break activities into 2 or 3 sessions throughout the day. Doing so is as good as one long session. Exercise ideas include:  Taking the dog for a walk.  Taking the stairs instead of the elevator.  Dancing to your favorite song.  Doing your favorite exercise with a friend. RECOMMENDATIONS FOR EXERCISING WITH TYPE 1 OR TYPE 2 DIABETES   Check your blood glucose before exercising. If blood glucose levels are greater than 240 mg/dL, check for urine ketones. Do not exercise if ketones are present.  Avoid injecting insulin into areas of the body that are going to be exercised. For example, avoid injecting insulin into:  The arms when playing tennis.  The legs when  jogging.  Keep a record of:  Food intake before and after you exercise.  Expected peak times of insulin action.  Blood glucose levels before and after you exercise.  The type and amount of exercise you have done.  Review your records with your health care provider. Your health care provider will help you to develop guidelines for adjusting food intake and insulin amounts before and after exercising.  If you take insulin or oral hypoglycemic agents, watch for signs and symptoms of hypoglycemia. They include:  Dizziness.  Shaking.  Sweating.  Chills.  Confusion.  Drink plenty of water while you exercise to prevent dehydration or heat stroke. Body water is lost during exercise and must be replaced.  Talk to your health care provider before starting an exercise program to make sure it is safe for you. Remember, almost any type of activity is better than none. Document Released: 06/22/2003 Document Revised: 12/02/2012 Document Reviewed: 09/08/2012 [facility] Patient Information 2014 [place], [place]. Dapagliflozin tablets What is this medicine? DAPAGLIFLOZIN (DAP a gli FLOE zin) helps to treat type 2 diabetes. It helps to control blood sugar. Treatment is combined with diet and exercise. This medicine may be used for other purposes; ask your health care provider or pharmacist if you have questions. COMMON BRAND NAME(S): FARXIGA What should I tell my health care provider before I take this medicine? They need to know if you have any of these conditions: -bladder cancer -dehydration -diabetic ketoacidosis -diet low in salt -high cholesterol -history of yeast infection of the penis or vagina -kidney disease -low blood pressure -on hemodialysis -type 1 diabetes -uncircumcised male -an unusual or allergic reaction to dapagliflozin, other medicines,  foods, dyes, or preservatives -pregnant or trying to get pregnant -breast-feeding How should I use this medicine? Take this  medicine by mouth with a glass of water. Follow the directions on the prescription label. You can take it with or without food. If it upsets your stomach, take it with food. Take this medicine in the morning. Take your dose at the same time each day. Do not take more often than directed. Do not stop taking except on your doctor's advice. A special MedGuide will be given to you by the pharmacist with each prescription and refill. Be sure to read this information carefully each time. Talk to your pediatrician regarding the use of this medicine in children. Special care may be needed. Overdosage: If you think you've taken too much of this medicine contact a poison control center or emergency room at once. Overdosage: If you think you have taken too much of this medicine contact a poison control center or emergency room at once. NOTE: This medicine is only for you. Do not share this medicine with others. What if I miss a dose? If you miss a dose, take it as soon as you can. If it is almost time for your next dose, take only that dose. Do not take double or extra doses. What may interact with this medicine? Do not take this medicine with any of the following medications: -gatifloxacinThis medicine may also interact with the following medications: -alcohol -certain medicines for blood pressure, heart disease -diuretics -insulin -nateglinide -pioglitazone -quinolone antibiotics like ciprofloxacin, levofloxacin, ofloxacin -repaglinide -some herbal dietary supplements -steroid medicines like prednisone or cortisone -sulfonylureas like glimepiride, glipizide, glyburide -thyroid medicine This list may not describe all possible interactions. Give your health care provider a list of all the medicines, herbs, non-prescription drugs, or dietary supplements you use. Also tell them if you smoke, drink alcohol, or use illegal drugs. Some items may interact with your medicine. What should I watch for while  using this medicine? Visit your doctor or health care professional for regular checks on your progress. A test called the HbA1C (A1C) will be monitored. This is a simple blood test. It measures your blood sugar control over the last 2 to 3 months. You will receive this test every 3 to 6 months. Learn how to check your blood sugar. Learn the symptoms of low and high blood sugar and how to manage them. Always carry a quick-source of sugar with you in case you have symptoms of low blood sugar. Examples include hard sugar candy or glucose tablets. Make sure others know that you can choke if you eat or drink when you develop serious symptoms of low blood sugar, such as seizures or unconsciousness. They must get medical help at once. Tell your doctor or health care professional if you have high blood sugar. You might need to change the dose of your medicine. If you are sick or exercising more than usual, you might need to change the dose of your medicine. Do not skip meals. Ask your doctor or health care professional if you should avoid alcohol. Many nonprescription cough and cold products contain sugar or alcohol. These can affect blood sugar. Wear a medical ID bracelet or chain, and carry a card that describes your disease and details of your medicine and dosage times. What side effects may I notice from receiving this medicine? Side effects that you should report to your doctor or health care professional as soon as possible: -allergic reactions like skin rash, itching or hives,  swelling of the face, lips, or tongue -breathing problems -dizziness -feeling faint or lightheaded, falls -fever, chills -muscle weakness -signs and symptoms of low blood sugar such as feeling anxious, confusion, dizziness, increased hunger, unusually weak or tired, sweating, shakiness, cold, irritable, headache, blurred vision, fast heartbeat, loss of consciousness -trouble passing urine or change in the amount of  urine -penile discharge, itching, or pain in men -vaginal discharge, itching, or odor in women  Side effects that usually do not require medical attention (Report these to your doctor or health care professional if they continue or are bothersome.): -constipation -nausea -increased urination -stuffy or runny nose -sore throat -thirsty This list may not describe all possible side effects. Call your doctor for medical advice about side effects. You may report side effects to FDA at 1-800-FDA-1088. Where should I keep my medicine? Keep out of the reach of children. Store at room temperature between 15 and 30 degrees C (59 and 86 degrees F). Throw away any unused medicine after the expiration date. NOTE: This sheet is a summary. It may not cover all possible information. If you have questions about this medicine, talk to your doctor, pharmacist, or health care provider.  2014, [employer] Standard. (2012-07-15 14:21:29) Diabetes Meal Planning Guide The diabetes meal planning guide is a tool to help you plan your meals and snacks. It is important for people with diabetes to manage their blood glucose (sugar) levels. Choosing the right foods and the right amounts throughout your day will help control your blood glucose. Eating right can even help you improve your blood pressure and reach or maintain a healthy weight. CARBOHYDRATE COUNTING MADE EASY When you eat carbohydrates, they turn to sugar. This raises your blood glucose level. Counting carbohydrates can help you control this level so you feel better. When you plan your meals by counting carbohydrates, you can have more flexibility in what you eat and balance your medicine with your food intake. Carbohydrate counting simply means adding up the total amount of carbohydrate grams in your meals and snacks. Try to eat about the same amount at each meal. Foods with carbohydrates are listed below. Each portion below is 1 carbohydrate serving or 15  grams of carbohydrates. Ask your dietician how many grams of carbohydrates you should eat at each meal or snack. Grains and Starches  1 slice bread.   English muffin or hotdog/hamburger bun.   cup cold cereal (unsweetened).   cup cooked pasta or rice.   cup starchy vegetables (corn, potatoes, peas, beans, winter squash).  1 tortilla (6 inches).   bagel.  1 waffle or pancake (size of a CD).   cup cooked cereal.  4 to 6 small crackers. *Whole grain is recommended. Fruit  1 cup fresh unsweetened berries, melon, papaya, pineapple.  1 small fresh fruit.   banana or mango.   cup fruit juice (4 oz unsweetened).   cup canned fruit in natural juice or water.  2 tbs dried fruit.  12 to 15 grapes or cherries. Milk and Yogurt  1 cup fat-free or 1% milk.  1 cup soy milk.  6 oz light yogurt with sugar-free sweetener.  6 oz low-fat soy yogurt.  6 oz plain yogurt. Vegetables  1 cup raw or  cup cooked is counted as 0 carbohydrates or a "free" food.  If you eat 3 or more servings at 1 meal, count them as 1 carbohydrate serving. Other Carbohydrates   oz chips or pretzels.   cup ice cream or frozen yogurt.  cup sherbet or sorbet.  2 inch square cake, no frosting.  1 tbs honey, sugar, jam, jelly, or syrup.  2 small cookies.  3 squares of graham crackers.  3 cups popcorn.  6 crackers.  1 cup broth-based soup.  Count 1 cup casserole or other mixed foods as 2 carbohydrate servings.  Foods with less than 20 calories in a serving may be counted as 0 carbohydrates or a "free" food. You may want to purchase a book or computer software that lists the carbohydrate gram counts of different foods. In addition, the nutrition facts panel on the labels of the foods you eat are a good source of this information. The label will tell you how big the serving size is and the total number of carbohydrate grams you will be eating per serving. Divide this number by 15  to obtain the number of carbohydrate servings in a portion. Remember, 1 carbohydrate serving equals 15 grams of carbohydrate. SERVING SIZES Measuring foods and serving sizes helps you make sure you are getting the right amount of food. The list below tells how big or small some common serving sizes are.  1 oz.........4 stacked dice.  3 oz........Marland KitchenDeck of cards.  1 tsp.......Marland KitchenTip of little finger.  1 tbs......Marland KitchenMarland KitchenThumb.  2 tbs.......Marland KitchenGolf ball.   cup......Marland KitchenHalf of a fist.  1 cup.......Marland KitchenA fist. SAMPLE DIABETES MEAL PLAN Below is a sample meal plan that includes foods from the grain and starches, dairy, vegetable, fruit, and meat groups. A dietician can individualize a meal plan to fit your calorie needs and tell you the number of servings needed from each food group. However, controlling the total amount of carbohydrates in your meal or snack is more important than making sure you include all of the food groups at every meal. You may interchange carbohydrate containing foods (dairy, starches, and fruits). The meal plan below is an example of a 2000 calorie diet using carbohydrate counting. This meal plan has 17 carbohydrate servings. Breakfast  1 cup oatmeal (2 carb servings).   cup light yogurt (1 carb serving).  1 cup blueberries (1 carb serving).   cup almonds. Snack  1 large apple (2 carb servings).  1 low-fat string cheese stick. Lunch  Chicken breast salad.  1 cup spinach.   cup chopped tomatoes.  2 oz chicken breast, sliced.  2 tbs low-fat [place] dressing.  12 whole-wheat crackers (2 carb servings).  12 to 15 grapes (1 carb serving).  1 cup low-fat milk (1 carb serving). Snack  1 cup carrots.   cup hummus (1 carb serving). Dinner  3 oz broiled salmon.  1 cup brown rice (3 carb servings). Snack  1  cups steamed broccoli (1 carb serving) drizzled with 1 tsp olive oil and lemon juice.  1 cup light pudding (2 carb servings). DIABETES MEAL  PLANNING WORKSHEET Your dietician can use this worksheet to help you decide how many servings of foods and what types of foods are right for you.  BREAKFAST Food Group and Servings / Carb Servings Grain/Starches __________________________________ Dairy __________________________________________ Vegetable ______________________________________ Fruit ___________________________________________ Meat __________________________________________ Fat ____________________________________________ LUNCH Food Group and Servings / Carb Servings Grain/Starches ___________________________________ Dairy ___________________________________________ Fruit ____________________________________________ Meat ___________________________________________ Fat _____________________________________________ [place] Food Group and Servings / Carb Servings Grain/Starches ___________________________________ Dairy ___________________________________________ Fruit ____________________________________________ Meat ___________________________________________ Fat _____________________________________________ SNACKS Food Group and Servings / Carb Servings Grain/Starches ___________________________________ Dairy ___________________________________________ Vegetable _______________________________________ Fruit ____________________________________________ Meat ___________________________________________ Fat _____________________________________________ DAILY TOTALS Starches _________________________ Vegetable ________________________ Fruit ____________________________ Dairy ____________________________ Meat ____________________________ Fat ______________________________  Document Released: 12/27/2004 Document Revised: 06/24/2011 Document Reviewed: 11/07/2008 [facility] Patient Information 2014 [place], [place].

## 2013-06-21 ENCOUNTER — Ambulatory Visit: Payer: Self-pay | Admitting: Internal Medicine

## 2013-07-05 ENCOUNTER — Other Ambulatory Visit: Payer: Self-pay | Admitting: Cardiovascular Disease

## 2013-07-09 ENCOUNTER — Telehealth: Payer: Self-pay | Admitting: Internal Medicine

## 2013-07-09 NOTE — Telephone Encounter (Signed)
Pt has come in today to see if he can get a doctor's note from yesterday's appt; pt is here in the lobby (2:00pm)

## 2013-08-10 ENCOUNTER — Other Ambulatory Visit: Payer: Self-pay | Admitting: *Deleted

## 2013-08-10 DIAGNOSIS — I429 Cardiomyopathy, unspecified: Secondary | ICD-10-CM

## 2013-08-11 ENCOUNTER — Encounter (HOSPITAL_COMMUNITY): Payer: Self-pay | Admitting: Cardiology

## 2013-08-11 ENCOUNTER — Encounter (HOSPITAL_COMMUNITY): Payer: Self-pay | Admitting: Radiology

## 2013-08-11 ENCOUNTER — Ambulatory Visit: Payer: BC Managed Care – PPO | Admitting: Cardiovascular Disease

## 2013-08-11 ENCOUNTER — Ambulatory Visit (HOSPITAL_COMMUNITY): Payer: BC Managed Care – PPO | Attending: Internal Medicine | Admitting: Radiology

## 2013-08-11 ENCOUNTER — Other Ambulatory Visit: Payer: Self-pay | Admitting: *Deleted

## 2013-08-11 ENCOUNTER — Encounter: Payer: Self-pay | Admitting: Internal Medicine

## 2013-08-11 DIAGNOSIS — I428 Other cardiomyopathies: Secondary | ICD-10-CM | POA: Insufficient documentation

## 2013-08-11 DIAGNOSIS — I429 Cardiomyopathy, unspecified: Secondary | ICD-10-CM

## 2013-08-11 NOTE — Progress Notes (Signed)
Echocardiogram performed.  

## 2013-08-20 ENCOUNTER — Ambulatory Visit (INDEPENDENT_AMBULATORY_CARE_PROVIDER_SITE_OTHER): Payer: Self-pay | Admitting: Cardiovascular Disease

## 2013-08-20 ENCOUNTER — Encounter: Payer: Self-pay | Admitting: Cardiovascular Disease

## 2013-08-20 VITALS — BP 128/88 | HR 80 | Ht 71.0 in | Wt 197.0 lb

## 2013-08-20 DIAGNOSIS — E119 Type 2 diabetes mellitus without complications: Secondary | ICD-10-CM

## 2013-08-20 DIAGNOSIS — I10 Essential (primary) hypertension: Secondary | ICD-10-CM

## 2013-08-20 DIAGNOSIS — E785 Hyperlipidemia, unspecified: Secondary | ICD-10-CM

## 2013-08-20 DIAGNOSIS — I428 Other cardiomyopathies: Secondary | ICD-10-CM

## 2013-08-20 NOTE — Assessment & Plan Note (Signed)
Cholesterol is at goal.  Continue current dose of statin and diet Rx.  No myalgias or side effects.  F/U  LFT's in 6 months. Lab Results  Component Value Date   LDLCALC 116* 01/19/2013

## 2013-08-20 NOTE — Assessment & Plan Note (Signed)
Euvolemic Get exercise capacity Compliant with meds EF improved  Continue current medical Rx  No CAD by cardiac CT

## 2013-08-20 NOTE — Assessment & Plan Note (Signed)
Discussed low carb diet.  Target hemoglobin A1c is 6.5 or less.  Continue current medications.  

## 2013-08-20 NOTE — Assessment & Plan Note (Signed)
Well controlled.  Continue current medications and low sodium Dash type diet.    

## 2013-08-20 NOTE — Patient Instructions (Signed)
Your physician wants you to follow-up in: YEAR WITH DR NISHAN  You will receive a reminder letter in the mail two months in advance. If you don't receive a letter, please call our office to schedule the follow-up appointment.  Your physician recommends that you continue on your current medications as directed. Please refer to the Current Medication list given to you today. 

## 2013-08-20 NOTE — Progress Notes (Signed)
Patient ID: Jason Stevenson, male   DOB: 1985/05/07, 28 y.o.   MRN: [id number] Jason Stevenson is a 28 y.o. male who returns for follow up after a recent admission to the hospital with CP 7/24-7/25. He has a hx of nonischemic cardiomyopathy, diabetes mellitus, HTN, HL. Cardiac MRI 04/2006: EF 45%, no hyper-enhancement. He has been lost to follow up. Recently seen in the hospital after presenting with chest pain. Myocardial infarction was ruled out by enzymes. ECG demonstrated anterolateral T wave inversions. Beta blocker and ACE inhibitor therapy was restarted. Of note, the patient has a history of noncompliance with medications and follow up. Echocardiogram 11/06/12: EF 20-25%, diffuse HK, mild MR. Cardiac CTA 7/14: Calcium score 0, normal coronary arteries.  He has occasional chest pain. He thinks that this is related to meals. He denies significant dyspnea with exertion. He is NYHA class II. He denies orthopnea, PND or edema. He denies syncope.  Recent labs show BS poorly controlled Needs f/u with community health  Chol ok    Last echo 08/11/13 reviewed EF 35-40%    Trying to play some basketball and feels ok    ROS: Denies fever, malais, weight loss, blurry vision, decreased visual acuity, cough, sputum, SOB, hemoptysis, pleuritic pain, palpitaitons, heartburn, abdominal pain, melena, lower extremity edema, claudication, or rash.  All other systems reviewed and negative  General: Affect appropriate Healthy:  appears stated age HEENT: normal Neck supple with no adenopathy JVP normal no bruits no thyromegaly Lungs clear with no wheezing and good diaphragmatic motion Heart:  S1/S2 no murmur, no rub, gallop or click PMI normal Abdomen: benighn, BS positve, no tenderness, no AAA no bruit.  No HSM or HJR Distal pulses intact with no bruits No edema Neuro non-focal Skin warm and dry No muscular weakness   Current Outpatient Prescriptions  Medication Sig Dispense Refill  . aspirin 81 MG  chewable tablet Chew 1 tablet (81 mg total) by mouth daily.  30 tablet  30  . atorvastatin (LIPITOR) 40 MG tablet TAKE ONE TABLET BY MOUTH ONCE DAILY AT 6 IN THE EVENING  30 tablet  0  . carvedilol (COREG) 6.25 MG tablet Take 1 tablet (6.25 mg total) by mouth 2 (two) times daily with a meal.  60 tablet  11  . Dapagliflozin Propanediol (FARXIGA) 5 MG TABS Take 5 mg by mouth daily.  30 tablet  3  . insulin aspart (NOVOLOG) 100 UNIT/ML injection Inject 10-50 Units into the skin 3 (three) times daily with meals.  18 vial  12  . Insulin Syringes, Disposable, U-100 1 ML MISC Insulin syringes 30-gauge  100 each  12  . lisinopril (PRINIVIL,ZESTRIL) 2.5 MG tablet Take 1 tablet (2.5 mg total) by mouth daily.  30 tablet  5  . metFORMIN (GLUCOPHAGE) 500 MG tablet Take 1 tablet (500 mg total) by mouth 2 (two) times daily with a meal.  180 tablet  3  . nitroGLYCERIN (NITROSTAT) 0.4 MG SL tablet Place 1 tablet (0.4 mg total) under the tongue every 5 (five) minutes as needed for chest pain.  25 tablet  2   No current facility-administered medications for this visit.    Allergies  Review of patient's allergies indicates no known allergies.  Electrocardiogram:  SR rate 88 nonspecific ST.T wave changes   Assessment and Plan

## 2013-09-15 ENCOUNTER — Other Ambulatory Visit: Payer: Self-pay | Admitting: Cardiovascular Disease

## 2013-09-20 ENCOUNTER — Encounter: Payer: Self-pay | Admitting: Internal Medicine

## 2013-09-20 ENCOUNTER — Ambulatory Visit: Payer: Self-pay | Attending: Internal Medicine | Admitting: Internal Medicine

## 2013-09-20 VITALS — BP 127/88 | HR 73 | Temp 98.3°F | Resp 16 | Ht 71.0 in | Wt 195.0 lb

## 2013-09-20 DIAGNOSIS — I10 Essential (primary) hypertension: Secondary | ICD-10-CM | POA: Insufficient documentation

## 2013-09-20 DIAGNOSIS — I428 Other cardiomyopathies: Secondary | ICD-10-CM | POA: Insufficient documentation

## 2013-09-20 DIAGNOSIS — Z7982 Long term (current) use of aspirin: Secondary | ICD-10-CM | POA: Insufficient documentation

## 2013-09-20 DIAGNOSIS — E785 Hyperlipidemia, unspecified: Secondary | ICD-10-CM | POA: Insufficient documentation

## 2013-09-20 DIAGNOSIS — Z794 Long term (current) use of insulin: Secondary | ICD-10-CM | POA: Insufficient documentation

## 2013-09-20 DIAGNOSIS — E119 Type 2 diabetes mellitus without complications: Secondary | ICD-10-CM | POA: Insufficient documentation

## 2013-09-20 LAB — POCT GLYCOSYLATED HEMOGLOBIN (HGB A1C): Hemoglobin A1C: 9.3

## 2013-09-20 LAB — GLUCOSE, POCT (MANUAL RESULT ENTRY): POC GLUCOSE: 156 mg/dL — AB (ref 70–99)

## 2013-09-20 MED ORDER — ATORVASTATIN CALCIUM 40 MG PO TABS: 40.0000 mg | ORAL_TABLET | Freq: Every day | ORAL | Status: DC

## 2013-09-20 NOTE — Patient Instructions (Signed)
Diabetes Meal Planning Guide The diabetes meal planning guide is a tool to help you plan your meals and snacks. It is important for people with diabetes to manage their blood glucose (sugar) levels. Choosing the right foods and the right amounts throughout your day will help control your blood glucose. Eating right can even help you improve your blood pressure and reach or maintain a healthy weight. CARBOHYDRATE COUNTING MADE EASY When you eat carbohydrates, they turn to sugar. This raises your blood glucose level. Counting carbohydrates can help you control this level so you feel better. When you plan your meals by counting carbohydrates, you can have more flexibility in what you eat and balance your medicine with your food intake. Carbohydrate counting simply means adding up the total amount of carbohydrate grams in your meals and snacks. Try to eat about the same amount at each meal. Foods with carbohydrates are listed below. Each portion below is 1 carbohydrate serving or 15 grams of carbohydrates. Ask your dietician how many grams of carbohydrates you should eat at each meal or snack. Grains and Starches  1 slice bread.   English muffin or hotdog/hamburger bun.   cup cold cereal (unsweetened).   cup cooked pasta or rice.   cup starchy vegetables (corn, potatoes, peas, beans, winter squash).  1 tortilla (6 inches).   bagel.  1 waffle or pancake (size of a CD).   cup cooked cereal.  4 to 6 small crackers. *Whole grain is recommended. Fruit  1 cup fresh unsweetened berries, melon, papaya, pineapple.  1 small fresh fruit.   banana or mango.   cup fruit juice (4 oz unsweetened).   cup canned fruit in natural juice or water.  2 tbs dried fruit.  12 to 15 grapes or cherries. Milk and Yogurt  1 cup fat-free or 1% milk.  1 cup soy milk.  6 oz light yogurt with sugar-free sweetener.  6 oz low-fat soy yogurt.  6 oz plain yogurt. Vegetables  1 cup raw or  cup  cooked is counted as 0 carbohydrates or a "free" food.  If you eat 3 or more servings at 1 meal, count them as 1 carbohydrate serving. Other Carbohydrates   oz chips or pretzels.   cup ice cream or frozen yogurt.   cup sherbet or sorbet.  2 inch square cake, no frosting.  1 tbs honey, sugar, jam, jelly, or syrup.  2 small cookies.  3 squares of graham crackers.  3 cups popcorn.  6 crackers.  1 cup broth-based soup.  Count 1 cup casserole or other mixed foods as 2 carbohydrate servings.  Foods with less than 20 calories in a serving may be counted as 0 carbohydrates or a "free" food. You may want to purchase a book or computer software that lists the carbohydrate gram counts of different foods. In addition, the nutrition facts panel on the labels of the foods you eat are a good source of this information. The label will tell you how big the serving size is and the total number of carbohydrate grams you will be eating per serving. Divide this number by 15 to obtain the number of carbohydrate servings in a portion. Remember, 1 carbohydrate serving equals 15 grams of carbohydrate. SERVING SIZES Measuring foods and serving sizes helps you make sure you are getting the right amount of food. The list below tells how big or small some common serving sizes are.  1 oz.........4 stacked dice.  3 oz.........Deck of cards.  1 tsp........Tip   of little finger.  1 tbs........Thumb.  2 tbs........Golf ball.   cup.......Half of a fist.  1 cup........A fist. SAMPLE DIABETES MEAL PLAN Below is a sample meal plan that includes foods from the grain and starches, dairy, vegetable, fruit, and meat groups. A dietician can individualize a meal plan to fit your calorie needs and tell you the number of servings needed from each food group. However, controlling the total amount of carbohydrates in your meal or snack is more important than making sure you include all of the food groups at every  meal. You may interchange carbohydrate containing foods (dairy, starches, and fruits). The meal plan below is an example of a 2000 calorie diet using carbohydrate counting. This meal plan has 17 carbohydrate servings. Breakfast  1 cup oatmeal (2 carb servings).   cup light yogurt (1 carb serving).  1 cup blueberries (1 carb serving).   cup almonds. Snack  1 large apple (2 carb servings).  1 low-fat string cheese stick. Lunch  Chicken breast salad.  1 cup spinach.   cup chopped tomatoes.  2 oz chicken breast, sliced.  2 tbs low-fat Italian dressing.  12 whole-wheat crackers (2 carb servings).  12 to 15 grapes (1 carb serving).  1 cup low-fat milk (1 carb serving). Snack  1 cup carrots.   cup hummus (1 carb serving). Dinner  3 oz broiled salmon.  1 cup brown rice (3 carb servings). Snack  1  cups steamed broccoli (1 carb serving) drizzled with 1 tsp olive oil and lemon juice.  1 cup light pudding (2 carb servings). DIABETES MEAL PLANNING WORKSHEET Your dietician can use this worksheet to help you decide how many servings of foods and what types of foods are right for you.  BREAKFAST Food Group and Servings / Carb Servings Grain/Starches __________________________________ Dairy __________________________________________ Vegetable ______________________________________ Fruit ___________________________________________ Meat __________________________________________ Fat ____________________________________________ LUNCH Food Group and Servings / Carb Servings Grain/Starches ___________________________________ Dairy ___________________________________________ Fruit ____________________________________________ Meat ___________________________________________ Fat _____________________________________________ DINNER Food Group and Servings / Carb Servings Grain/Starches ___________________________________ Dairy  ___________________________________________ Fruit ____________________________________________ Meat ___________________________________________ Fat _____________________________________________ SNACKS Food Group and Servings / Carb Servings Grain/Starches ___________________________________ Dairy ___________________________________________ Vegetable _______________________________________ Fruit ____________________________________________ Meat ___________________________________________ Fat _____________________________________________ DAILY TOTALS Starches _________________________ Vegetable ________________________ Fruit ____________________________ Dairy ____________________________ Meat ____________________________ Fat ______________________________ Document Released: 12/27/2004 Document Revised: 06/24/2011 Document Reviewed: 11/07/2008 [employer] Patient Information 2014 [employer]. Diabetes and Exercise Exercising regularly is important. It is not just about losing weight. It has many health benefits, such as:  Improving your overall fitness, flexibility, and endurance.  Increasing your bone density.  Helping with weight control.  Decreasing your body fat.  Increasing your muscle strength.  Reducing stress and tension.  Improving your overall health. People with diabetes who exercise gain additional benefits because exercise:  Reduces appetite.  Improves the body's use of blood sugar (glucose).  Helps lower or control blood glucose.  Decreases blood pressure.  Helps control blood lipids (such as cholesterol and triglycerides).  Improves the body's use of the hormone insulin by:  Increasing the body's insulin sensitivity.  Reducing the body's insulin needs.  Decreases the risk for heart disease because exercising:  Lowers cholesterol and triglycerides levels.  Increases the levels of good cholesterol (such as high-density lipoproteins [HDL]) in the  body.  Lowers blood glucose levels. YOUR ACTIVITY PLAN  Choose an activity that you enjoy and set realistic goals. Your health care provider or diabetes educator can help you make an activity plan that works for you. You   can break activities into 2 or 3 sessions throughout the day. Doing so is as good as one long session. Exercise ideas include:  Taking the dog for a walk.  Taking the stairs instead of the elevator.  Dancing to your favorite song.  Doing your favorite exercise with a friend. RECOMMENDATIONS FOR EXERCISING WITH TYPE 1 OR TYPE 2 DIABETES   Check your blood glucose before exercising. If blood glucose levels are greater than 240 mg/dL, check for urine ketones. Do not exercise if ketones are present.  Avoid injecting insulin into areas of the body that are going to be exercised. For example, avoid injecting insulin into:  The arms when playing tennis.  The legs when jogging.  Keep a record of:  Food intake before and after you exercise.  Expected peak times of insulin action.  Blood glucose levels before and after you exercise.  The type and amount of exercise you have done.  Review your records with your health care provider. Your health care provider will help you to develop guidelines for adjusting food intake and insulin amounts before and after exercising.  If you take insulin or oral hypoglycemic agents, watch for signs and symptoms of hypoglycemia. They include:  Dizziness.  Shaking.  Sweating.  Chills.  Confusion.  Drink plenty of water while you exercise to prevent dehydration or heat stroke. Body water is lost during exercise and must be replaced.  Talk to your health care provider before starting an exercise program to make sure it is safe for you. Remember, almost any type of activity is better than none. Document Released: 06/22/2003 Document Revised: 12/02/2012 Document Reviewed: 09/08/2012 [facility] Patient Information 2014 [place],  [place]. DASH Diet The DASH diet stands for "Dietary Approaches to Stop Hypertension." It is a healthy eating plan that has been shown to reduce high blood pressure (hypertension) in as little as 14 days, while also possibly providing other significant health benefits. These other health benefits include reducing the risk of breast cancer after menopause and reducing the risk of type 2 diabetes, heart disease, colon cancer, and stroke. Health benefits also include weight loss and slowing kidney failure in patients with chronic kidney disease.  DIET GUIDELINES  Limit salt (sodium). Your diet should contain less than 1500 mg of sodium daily.  Limit refined or processed carbohydrates. Your diet should include mostly whole grains. Desserts and added sugars should be used sparingly.  Include small amounts of heart-healthy fats. These types of fats include nuts, oils, and tub margarine. Limit saturated and trans fats. These fats have been shown to be harmful in the body. CHOOSING FOODS  The following food groups are based on a 2000 calorie diet. See your Registered Dietitian for individual calorie needs. Grains and Grain Products (6 to 8 servings daily)  Eat More Often: Whole-wheat bread, brown rice, whole-grain or wheat pasta, quinoa, popcorn without added fat or salt (air popped).  Eat Less Often: White bread, white pasta, white rice, cornbread. Vegetables (4 to 5 servings daily)  Eat More Often: Fresh, frozen, and canned vegetables. Vegetables may be raw, steamed, roasted, or grilled with a minimal amount of fat.  Eat Less Often/Avoid: Creamed or fried vegetables. Vegetables in a cheese sauce. Fruit (4 to 5 servings daily)  Eat More Often: All fresh, canned (in natural juice), or frozen fruits. Dried fruits without added sugar. One hundred percent fruit juice ( cup [237 mL] daily).  Eat Less Often: Dried fruits with added sugar. Canned fruit in light or heavy  syrup. Foot LockerLean Meats, Fish, and Poultry  (2 servings or less daily. One serving is 3 to 4 oz [85-114 g]).  Eat More Often: Ninety percent or leaner ground beef, tenderloin, sirloin. Round cuts of beef, chicken breast, [place]key breast. All fish. Grill, bake, or broil your meat. Nothing should be fried.  Eat Less Often/Avoid: Fatty cuts of meat, [place]key, or chicken leg, thigh, or wing. Fried cuts of meat or fish. Dairy (2 to 3 servings)  Eat More Often: Low-fat or fat-free milk, low-fat plain or light yogurt, reduced-fat or part-skim cheese.  Eat Less Often/Avoid: Milk (whole, 2%).Whole milk yogurt. Full-fat cheeses. Nuts, Seeds, and Legumes (4 to 5 servings per week)  Eat More Often: All without added salt.  Eat Less Often/Avoid: Salted nuts and seeds, canned beans with added salt. Fats and Sweets (limited)  Eat More Often: Vegetable oils, tub margarines without trans fats, sugar-free gelatin. Mayonnaise and salad dressings.  Eat Less Often/Avoid: Coconut oils, palm oils, butter, stick margarine, cream, half and half, cookies, candy, pie. FOR MORE INFORMATION The Dash Diet Eating Plan: www.dashdiet.org Document Released: 03/21/2011 Document Revised: 06/24/2011 Document Reviewed: 03/21/2011 [facility] Patient Information 2014 [place].

## 2013-09-20 NOTE — Progress Notes (Signed)
Pt is here following up on his HTN and diabetes. 

## 2013-09-20 NOTE — Progress Notes (Signed)
Patient ID: Tirek Hagedorn, male   DOB: 11/08/1985, 28 y.o.   MRN: [id number]   Edahi Kirtz, is a 28 y.o. male  [id number]  [id number]  DOB - 30-Nov-1985  Chief Complaint  Patient presents with  . Follow-up        Subjective:   Khyrie Heuton is a 28 y.o. male here today for a follow up visit. Very pleasant young man with no significant complaint today, they are for medication refill (Lipitor). He has history of diabetes, hypertension, nonischemic cardiomyopathy and dyslipidemia. He is compliant with medications, last hemoglobin A1c was 11.8%. Blood sugar range at home is reported to be between 120 and 180, occasionally 200 postprandial. Patient does not smoke cigarette he does not drink alcohol Patient has No headache, No chest pain, No abdominal pain - No Nausea, No new weakness tingling or numbness, No Cough - SOB.  No problems updated.  ALLERGIES: No Known Allergies  PAST MEDICAL HISTORY: Past Medical History  Diagnosis Date  . Diabetes mellitus without complication   . Hypertension   . Nonischemic cardiomyopathy Noted as early as 2007    Per chart review (cards consult note 2011), EF of 40% in 2007, down to 20-25% in 2011  . Hyperlipidemia     MEDICATIONS AT HOME: Prior to Admission medications   Medication Sig Start Date End Date Taking? Authorizing Provider  atorvastatin (LIPITOR) 40 MG tablet Take 1 tablet (40 mg total) by mouth daily at 6 PM. 09/20/13  Yes Jeanann Lewandowsky, MD  carvedilol (COREG) 6.25 MG tablet Take 1 tablet (6.25 mg total) by mouth 2 (two) times daily with a meal. 12/16/12  Yes Beatrice Lecher, PA-C  Dapagliflozin Propanediol (FARXIGA) 5 MG TABS Take 5 mg by mouth daily. 06/17/13  Yes Jeanann Lewandowsky, MD  insulin aspart (NOVOLOG) 100 UNIT/ML injection Inject 10-50 Units into the skin 3 (three) times daily with meals. 06/09/13  Yes Jeanann Lewandowsky, MD  Insulin Syringes, Disposable, U-100 1 ML MISC Insulin syringes 30-gauge 11/20/12  Yes  Ripudeep K Rai, MD  lisinopril (PRINIVIL,ZESTRIL) 2.5 MG tablet Take 1 tablet (2.5 mg total) by mouth daily. 06/09/13  Yes Jeanann Lewandowsky, MD  metFORMIN (GLUCOPHAGE) 500 MG tablet Take 1 tablet (500 mg total) by mouth 2 (two) times daily with a meal. 03/22/13  Yes Jeanann Lewandowsky, MD  nitroGLYCERIN (NITROSTAT) 0.4 MG SL tablet Place 1 tablet (0.4 mg total) under the tongue every 5 (five) minutes as needed for chest pain. 12/16/12  Yes Beatrice Lecher, PA-C  aspirin 81 MG chewable tablet Chew 1 tablet (81 mg total) by mouth daily. 11/06/12   Jacquelin Hawking, MD     Objective:   Filed Vitals:   09/20/13 1455  BP: 127/88  Pulse: 73  Temp: 98.3 F (36.8 C)  TempSrc: Oral  Resp: 16  Height: 5\' 11"  (1.803 m)  Weight: 195 lb (88.451 kg)  SpO2: 97%    Exam General appearance : Awake, alert, not in any distress. Speech Clear. Not toxic looking HEENT: Atraumatic and Normocephalic, pupils equally reactive to light and accomodation Neck: supple, no JVD. No cervical lymphadenopathy.  Chest:Good air entry bilaterally, no added sounds  CVS: S1 S2 regular, no murmurs.  Abdomen: Bowel sounds present, Non tender and not distended with no gaurding, rigidity or rebound. Extremities: B/L Lower Ext shows no edema, both legs are warm to touch Neurology: Awake alert, and oriented X 3, CN II-XII intact, Non focal Skin:No Rash Wounds:N/A  Data Review Lab Results  Component Value Date  HGBA1C 11.8 06/17/2013   HGBA1C 10.9* 02/03/2013   HGBA1C 11.4* 11/05/2012     Assessment & Plan   1. Diabetes - Glucose (CBG) - HgB A1c is 9.3% today Continue current regimen of metformin, Farxiga, and insulin - Ambulatory referral to Ophthalmology for routine diabetic retinopathy screening  2. HLD (hyperlipidemia) Refill - atorvastatin (LIPITOR) 40 MG tablet; Take 1 tablet (40 mg total) by mouth daily at 6 PM.  Dispense: 90 tablet; Refill: 3  3. HTN (hypertension) Continue lisinopril 2.5 mg tablet by mouth  daily-carvedilol 6.25 mg tablet by mouth twice a day  DASH diet Patient counseled extensively about nutrition and exercise   Return for Hemoglobin A1C and Follow up, DM, Follow up HTN. in 3 months  The patient was given clear instructions to go to ER or return to medical center if symptoms don't improve, worsen or new problems develop. The patient verbalized understanding. The patient was told to call to get lab results if they haven't heard anything in the next week.   This note has been created with Education officer, environmentalDragon speech recognition software and smart phrase technology. Any transcriptional errors are unintentional.    Jeanann Lewandowskylugbemiga Marks Scalera, MD, MHA, FACP, [facility]FA[facility] [place], [place] [phone]   09/20/2013, 3:24 PM

## 2013-09-27 ENCOUNTER — Other Ambulatory Visit: Payer: Self-pay | Admitting: Internal Medicine

## 2013-09-27 DIAGNOSIS — E785 Hyperlipidemia, unspecified: Secondary | ICD-10-CM

## 2013-09-27 DIAGNOSIS — IMO0002 Reserved for concepts with insufficient information to code with codable children: Secondary | ICD-10-CM

## 2013-09-27 DIAGNOSIS — E118 Type 2 diabetes mellitus with unspecified complications: Secondary | ICD-10-CM

## 2013-09-27 DIAGNOSIS — E1165 Type 2 diabetes mellitus with hyperglycemia: Secondary | ICD-10-CM

## 2013-09-27 MED ORDER — ATORVASTATIN CALCIUM 40 MG PO TABS: 40.0000 mg | ORAL_TABLET | Freq: Every day | ORAL | Status: DC

## 2013-10-04 ENCOUNTER — Ambulatory Visit: Payer: Self-pay | Admitting: Home Health Services

## 2013-11-18 ENCOUNTER — Encounter: Payer: Self-pay | Admitting: Internal Medicine

## 2013-11-18 ENCOUNTER — Ambulatory Visit: Payer: Self-pay | Attending: Internal Medicine | Admitting: Internal Medicine

## 2013-11-18 VITALS — HR 81 | Temp 98.4°F | Resp 16 | Ht 71.0 in | Wt 193.0 lb

## 2013-11-18 DIAGNOSIS — I10 Essential (primary) hypertension: Secondary | ICD-10-CM | POA: Insufficient documentation

## 2013-11-18 DIAGNOSIS — Z760 Encounter for issue of repeat prescription: Secondary | ICD-10-CM | POA: Insufficient documentation

## 2013-11-18 DIAGNOSIS — E1165 Type 2 diabetes mellitus with hyperglycemia: Secondary | ICD-10-CM

## 2013-11-18 DIAGNOSIS — R739 Hyperglycemia, unspecified: Secondary | ICD-10-CM | POA: Insufficient documentation

## 2013-11-18 DIAGNOSIS — IMO0002 Reserved for concepts with insufficient information to code with codable children: Secondary | ICD-10-CM

## 2013-11-18 DIAGNOSIS — E118 Type 2 diabetes mellitus with unspecified complications: Secondary | ICD-10-CM

## 2013-11-18 DIAGNOSIS — I428 Other cardiomyopathies: Secondary | ICD-10-CM | POA: Insufficient documentation

## 2013-11-18 DIAGNOSIS — E785 Hyperlipidemia, unspecified: Secondary | ICD-10-CM | POA: Insufficient documentation

## 2013-11-18 DIAGNOSIS — E119 Type 2 diabetes mellitus without complications: Secondary | ICD-10-CM | POA: Insufficient documentation

## 2013-11-18 DIAGNOSIS — R7309 Other abnormal glucose: Secondary | ICD-10-CM

## 2013-11-18 LAB — GLUCOSE, POCT (MANUAL RESULT ENTRY)
POC Glucose: 355 mg/dl — AB (ref 70–99)
POC Glucose: 247 mg/dl — AB (ref 70–99)

## 2013-11-18 MED ORDER — CARVEDILOL 6.25 MG PO TABS: 6.2500 mg | ORAL_TABLET | Freq: Two times a day (BID) | ORAL | Status: DC

## 2013-11-18 MED ORDER — INSULIN ASPART 100 UNIT/ML ~~LOC~~ SOLN
20.0000 [IU] | Freq: Once | SUBCUTANEOUS | Status: AC
  Administered 2013-11-18: 20 [IU] via SUBCUTANEOUS

## 2013-11-18 MED ORDER — DAPAGLIFLOZIN PROPANEDIOL 10 MG PO TABS: 10.0000 mg | ORAL_TABLET | Freq: Every day | ORAL | Status: DC

## 2013-11-18 NOTE — Progress Notes (Signed)
Pt comes in for foot ulcer evaluation Need medication refill Farxiga 5 mg tab/Coreg Pt admits he didn't take insulin today CBG- 355, 20 units given per protocol

## 2013-11-18 NOTE — Patient Instructions (Signed)
DASH Eating Plan DASH stands for "Dietary Approaches to Stop Hypertension." The DASH eating plan is a healthy eating plan that has been shown to reduce high blood pressure (hypertension). Additional health benefits may include reducing the risk of type 2 diabetes mellitus, heart disease, and stroke. The DASH eating plan may also help with weight loss. WHAT DO I NEED TO KNOW ABOUT THE DASH EATING PLAN? For the DASH eating plan, you will follow these general guidelines:  Choose foods with a percent daily value for sodium of less than 5% (as listed on the food label).  Use salt-free seasonings or herbs instead of table salt or sea salt.  Check with your health care provider or pharmacist before using salt substitutes.  Eat lower-sodium products, often labeled as "lower sodium" or "no salt added."  Eat fresh foods.  Eat more vegetables, fruits, and low-fat dairy products.  Choose whole grains. Look for the word "whole" as the first word in the ingredient list.  Choose fish and skinless chicken or turkey more often than red meat. Limit fish, poultry, and meat to 6 oz (170 g) each day.  Limit sweets, desserts, sugars, and sugary drinks.  Choose heart-healthy fats.  Limit cheese to 1 oz (28 g) per day.  Eat more home-cooked food and less restaurant, buffet, and fast food.  Limit fried foods.  Cook foods using methods other than frying.  Limit canned vegetables. If you do use them, rinse them well to decrease the sodium.  When eating at a restaurant, ask that your food be prepared with less salt, or no salt if possible. WHAT FOODS CAN I EAT? Seek help from a dietitian for individual calorie needs. Grains Whole grain or whole wheat bread. Brown rice. Whole grain or whole wheat pasta. Quinoa, bulgur, and whole grain cereals. Low-sodium cereals. Corn or whole wheat flour tortillas. Whole grain cornbread. Whole grain crackers. Low-sodium crackers. Vegetables Fresh or frozen vegetables  (raw, steamed, roasted, or grilled). Low-sodium or reduced-sodium tomato and vegetable juices. Low-sodium or reduced-sodium tomato sauce and paste. Low-sodium or reduced-sodium canned vegetables.  Fruits All fresh, canned (in natural juice), or frozen fruits. Meat and Other Protein Products Ground beef (85% or leaner), grass-fed beef, or beef trimmed of fat. Skinless chicken or turkey. Ground chicken or turkey. Pork trimmed of fat. All fish and seafood. Eggs. Dried beans, peas, or lentils. Unsalted nuts and seeds. Unsalted canned beans. Dairy Low-fat dairy products, such as skim or 1% milk, 2% or reduced-fat cheeses, low-fat ricotta or cottage cheese, or plain low-fat yogurt. Low-sodium or reduced-sodium cheeses. Fats and Oils Tub margarines without trans fats. Light or reduced-fat mayonnaise and salad dressings (reduced sodium). Avocado. Safflower, olive, or canola oils. Natural peanut or almond butter. Other Unsalted popcorn and pretzels. The items listed above may not be a complete list of recommended foods or beverages. Contact your dietitian for more options. WHAT FOODS ARE NOT RECOMMENDED? Grains White bread. White pasta. White rice. Refined cornbread. Bagels and croissants. Crackers that contain trans fat. Vegetables Creamed or fried vegetables. Vegetables in a cheese sauce. Regular canned vegetables. Regular canned tomato sauce and paste. Regular tomato and vegetable juices. Fruits Dried fruits. Canned fruit in light or heavy syrup. Fruit juice. Meat and Other Protein Products Fatty cuts of meat. Ribs, chicken wings, bacon, sausage, bologna, salami, chitterlings, fatback, hot dogs, bratwurst, and packaged luncheon meats. Salted nuts and seeds. Canned beans with salt. Dairy Whole or 2% milk, cream, half-and-half, and cream cheese. Whole-fat or sweetened yogurt. Full-fat   cheeses or blue cheese. Nondairy creamers and whipped toppings. Processed cheese, cheese spreads, or cheese  curds. Condiments Onion and garlic salt, seasoned salt, table salt, and sea salt. Canned and packaged gravies. Worcestershire sauce. Tartar sauce. Barbecue sauce. Teriyaki sauce. Soy sauce, including reduced sodium. Steak sauce. Fish sauce. Oyster sauce. Cocktail sauce. Horseradish. Ketchup and mustard. Meat flavorings and tenderizers. Bouillon cubes. Hot sauce. Tabasco sauce. Marinades. Taco seasonings. Relishes. Fats and Oils Butter, stick margarine, lard, shortening, ghee, and bacon fat. Coconut, palm kernel, or palm oils. Regular salad dressings. Other Pickles and olives. Salted popcorn and pretzels. The items listed above may not be a complete list of foods and beverages to avoid. Contact your dietitian for more information. WHERE CAN I FIND MORE INFORMATION? National Heart, Lung, and Blood Institute: www.nhlbi.nih.gov/health/health-topics/topics/dash/ Document Released: 03/21/2011 Document Revised: 08/16/2013 Document Reviewed: 02/03/2013 ExitCare Patient Information 2015 [employer]. This information is not intended to replace advice given to you by your health care provider. Make sure you discuss any questions you have with your health care provider. Diabetes and Exercise Exercising regularly is important. It is not just about losing weight. It has many health benefits, such as:  Improving your overall fitness, flexibility, and endurance.  Increasing your bone density.  Helping with weight control.  Decreasing your body fat.  Increasing your muscle strength.  Reducing stress and tension.  Improving your overall health. People with diabetes who exercise gain additional benefits because exercise:  Reduces appetite.  Improves the body's use of blood sugar (glucose).  Helps lower or control blood glucose.  Decreases blood pressure.  Helps control blood lipids (such as cholesterol and triglycerides).  Improves the body's use of the hormone insulin by:  Increasing the  body's insulin sensitivity.  Reducing the body's insulin needs.  Decreases the risk for heart disease because exercising:  Lowers cholesterol and triglycerides levels.  Increases the levels of good cholesterol (such as high-density lipoproteins [HDL]) in the body.  Lowers blood glucose levels. YOUR ACTIVITY PLAN  Choose an activity that you enjoy and set realistic goals. Your health care provider or diabetes educator can help you make an activity plan that works for you. Exercise regularly as directed by your health care provider. This includes:  Performing resistance training twice a week such as push-ups, sit-ups, lifting weights, or using resistance bands.  Performing 150 minutes of cardio exercises each week such as walking, running, or playing sports.  Staying active and spending no more than 90 minutes at one time being inactive. Even short bursts of exercise are good for you. Three 10-minute sessions spread throughout the day are just as beneficial as a single 30-minute session. Some exercise ideas include:  Taking the dog for a walk.  Taking the stairs instead of the elevator.  Dancing to your favorite song.  Doing an exercise video.  Doing your favorite exercise with a friend. RECOMMENDATIONS FOR EXERCISING WITH TYPE 1 OR TYPE 2 DIABETES   Check your blood glucose before exercising. If blood glucose levels are greater than 240 mg/dL, check for urine ketones. Do not exercise if ketones are present.  Avoid injecting insulin into areas of the body that are going to be exercised. For example, avoid injecting insulin into:  The arms when playing tennis.  The legs when jogging.  Keep a record of:  Food intake before and after you exercise.  Expected peak times of insulin action.  Blood glucose levels before and after you exercise.  The type and amount of exercise   you have done.  Review your records with your health care provider. Your health care provider will  help you to develop guidelines for adjusting food intake and insulin amounts before and after exercising.  If you take insulin or oral hypoglycemic agents, watch for signs and symptoms of hypoglycemia. They include:  Dizziness.  Shaking.  Sweating.  Chills.  Confusion.  Drink plenty of water while you exercise to prevent dehydration or heat stroke. Body water is lost during exercise and must be replaced.  Talk to your health care provider before starting an exercise program to make sure it is safe for you. Remember, almost any type of activity is better than none. Document Released: 06/22/2003 Document Revised: 08/16/2013 Document Reviewed: 09/08/2012 ExitCare Patient Information 2015 ExitCare[employer]. This information is not intended to replace advice given to you by your health care provider. Make sure you discuss any questions you have with your health care provider. Diabetes Mellitus and Food It is important for you to manage your blood sugar (glucose) level. Your blood glucose level can be greatly affected by what you eat. Eating healthier foods in the appropriate amounts throughout the day at about the same time each day will help you control your blood glucose level. It can also help slow or prevent worsening of your diabetes mellitus. Healthy eating may even help you improve the level of your blood pressure and reach or maintain a healthy weight.  HOW CAN FOOD AFFECT ME? Carbohydrates Carbohydrates affect your blood glucose level more than any other type of food. Your dietitian will help you determine how many carbohydrates to eat at each meal and teach you how to count carbohydrates. Counting carbohydrates is important to keep your blood glucose at a healthy level, especially if you are using insulin or taking certain medicines for diabetes mellitus. Alcohol Alcohol can cause sudden decreases in blood glucose (hypoglycemia), especially if you use insulin or take certain medicines for  diabetes mellitus. Hypoglycemia can be a life-threatening condition. Symptoms of hypoglycemia (sleepiness, dizziness, and disorientation) are similar to symptoms of having too much alcohol.  If your health care provider has given you approval to drink alcohol, do so in moderation and use the following guidelines:  Women should not have more than one drink per day, and men should not have more than two drinks per day. One drink is equal to:  12 oz of beer.  5 oz of wine.  1 oz of hard liquor.  Do not drink on an empty stomach.  Keep yourself hydrated. Have water, diet soda, or unsweetened iced tea.  Regular soda, juice, and other mixers might contain a lot of carbohydrates and should be counted. WHAT FOODS ARE NOT RECOMMENDED? As you make food choices, it is important to remember that all foods are not the same. Some foods have fewer nutrients per serving than other foods, even though they might have the same number of calories or carbohydrates. It is difficult to get your body what it needs when you eat foods with fewer nutrients. Examples of foods that you should avoid that are high in calories and carbohydrates but low in nutrients include:  Trans fats (most processed foods list trans fats on the Nutrition Facts label).  Regular soda.  Juice.  Candy.  Sweets, such as cake, pie, doughnuts, and cookies.  Fried foods. WHAT FOODS CAN I EAT? Have nutrient-rich foods, which will nourish your body and keep you healthy. The food you should eat also will depend on several factors,   including:  The calories you need.  The medicines you take.  Your weight.  Your blood glucose level.  Your blood pressure level.  Your cholesterol level. You also should eat a variety of foods, including:  Protein, such as meat, poultry, fish, tofu, nuts, and seeds (lean animal proteins are best).  Fruits.  Vegetables.  Dairy products, such as milk, cheese, and yogurt (low fat is  best).  Breads, grains, pasta, cereal, rice, and beans.  Fats such as olive oil, trans fat-free margarine, canola oil, avocado, and olives. DOES EVERYONE WITH DIABETES MELLITUS HAVE THE SAME MEAL PLAN? Because every person with diabetes mellitus is different, there is not one meal plan that works for everyone. It is very important that you meet with a dietitian who will help you create a meal plan that is just right for you. Document Released: 12/27/2004 Document Revised: 04/06/2013 Document Reviewed: 02/26/2013 ExitCare Patient Information 2015 [employer]. This information is not intended to replace advice given to you by your health care provider. Make sure you discuss any questions you have with your health care provider.  

## 2013-11-18 NOTE — Progress Notes (Signed)
Patient ID: Jason Stevenson, male   DOB: Jun 15, 1985, 28 y.o.   MRN: [id number]   Jason Stevenson, is a 29 y.o. male  [id number]  [id number]  DOB - 1986/02/01  Chief Complaint  Patient presents with  . Follow-up    foot ulcer  . Diabetes        Subjective:   Jason Stevenson is a 28 y.o. male here today for a follow up visit. Patient has history of diabetes mellitus, hypertension, nonischemic cardiomyopathy and hyperlipidemia, with poor medication compliance here today for followup and for medication refill. He has no new complaint. He has not been his medications as prescribed including insulin and oral hypoglycemics. His blood pressure is controlled but his blood sugar today is high. Patient has No headache, No chest pain, No abdominal pain - No Nausea, No new weakness tingling or numbness, No Cough - SOB.  Problem  Hyperglycemia  Type II Or Unspecified Type Diabetes Mellitus With Unspecified Complication, Uncontrolled  Other Primary Cardiomyopathies    ALLERGIES: No Known Allergies  PAST MEDICAL HISTORY: Past Medical History  Diagnosis Date  . Diabetes mellitus without complication   . Hypertension   . Nonischemic cardiomyopathy Noted as early as 2007    Per chart review (cards consult note 2011), EF of 40% in 2007, down to 20-25% in 2011  . Hyperlipidemia     MEDICATIONS AT HOME: Prior to Admission medications   Medication Sig Start Date End Date Taking? Authorizing Provider  atorvastatin (LIPITOR) 40 MG tablet Take 1 tablet (40 mg total) by mouth daily at 6 PM. 09/27/13  Yes Jeanann Lewandowsky, MD  carvedilol (COREG) 6.25 MG tablet Take 1 tablet (6.25 mg total) by mouth 2 (two) times daily with a meal. 11/18/13  Yes Jeanann Lewandowsky, MD  insulin aspart (NOVOLOG) 100 UNIT/ML injection Inject 10-50 Units into the skin 3 (three) times daily with meals. 06/09/13  Yes Jeanann Lewandowsky, MD  lisinopril (PRINIVIL,ZESTRIL) 2.5 MG tablet Take 1 tablet (2.5 mg total) by  mouth daily. 06/09/13  Yes Jeanann Lewandowsky, MD  metFORMIN (GLUCOPHAGE) 500 MG tablet Take 1 tablet (500 mg total) by mouth 2 (two) times daily with a meal. 03/22/13  Yes Jeanann Lewandowsky, MD  nitroGLYCERIN (NITROSTAT) 0.4 MG SL tablet Place 1 tablet (0.4 mg total) under the tongue every 5 (five) minutes as needed for chest pain. 12/16/12  Yes Beatrice Lecher, PA-C  aspirin 81 MG chewable tablet Chew 1 tablet (81 mg total) by mouth daily. 11/06/12   Jacquelin Hawking, MD  Dapagliflozin Propanediol (FARXIGA) 10 MG TABS Take 10 mg by mouth daily. 11/18/13   Jeanann Lewandowsky, MD  Insulin Syringes, Disposable, U-100 1 ML MISC Insulin syringes 30-gauge 11/20/12   Ripudeep Jenna Luo, MD     Objective:   Filed Vitals:   11/18/13 1707  Pulse: 81  Temp: 98.4 F (36.9 C)  TempSrc: Oral  Resp: 16  Height: 5\' 11"  (1.803 m)  Weight: 193 lb (87.544 kg)  SpO2: 99%    Exam General appearance : Awake, alert, not in any distress. Speech Clear. Not toxic looking HEENT: Atraumatic and Normocephalic, pupils equally reactive to light and accomodation Neck: supple, no JVD. No cervical lymphadenopathy.  Chest:Good air entry bilaterally, no added sounds  CVS: S1 S2 regular, no murmurs.  Abdomen: Bowel sounds present, Non tender and not distended with no gaurding, rigidity or rebound. Extremities: B/L Lower Ext shows no edema, both legs are warm to touch Neurology: Awake alert, and oriented X 3, CN II-XII intact,  Non focal Skin:No Rash Wounds:N/A  Data Review Lab Results  Component Value Date   HGBA1C 9.3 09/20/2013   HGBA1C 11.8 06/17/2013   HGBA1C 10.9* 02/03/2013     Assessment & Plan   1. Essential hypertension Continue current medication DASH diet We discussed blood pressure goal  2. Hyperglycemia  - insulin aspart (novoLOG) injection 20 Units; Inject 0.2 mLs (20 Units total) into the skin once.  3. Type II or unspecified type diabetes mellitus with unspecified complication, uncontrolled Refill -  Dapagliflozin Propanediol (FARXIGA) 10 MG TABS; Take 10 mg by mouth daily.  Dispense: 30 tablet; Refill: 3  4. Other primary cardiomyopathies Refill - carvedilol (COREG) 6.25 MG tablet; Take 1 tablet (6.25 mg total) by mouth 2 (two) times daily with a meal.  Dispense: 180 tablet; Refill: 3  Patient was extensively counseled about nutrition and exercise Patient was again counseled extensively on the need to be compliant with medication, discussed consequences of uncontrolled blood sugar  Return in about 4 weeks (around 12/16/2013), or if symptoms worsen or fail to improve, for Hemoglobin A1C and Follow up, DM, Follow up HTN.  The patient was given clear instructions to go to ER or return to medical center if symptoms don't improve, worsen or new problems develop. The patient verbalized understanding. The patient was told to call to get lab results if they haven't heard anything in the next week.   This note has been created with Education officer, environmentalDragon speech recognition software and smart phrase technology. Any transcriptional errors are unintentional.    Jeanann LewandowskyJEGEDE, Thomson Herbers, MD, MHA, FACP, FAAP [facility] [place], [place] [phone]   11/18/2013, 5:19 PM

## 2013-12-23 ENCOUNTER — Ambulatory Visit: Payer: Self-pay | Attending: Internal Medicine | Admitting: Internal Medicine

## 2013-12-23 ENCOUNTER — Encounter: Payer: Self-pay | Admitting: Internal Medicine

## 2013-12-23 VITALS — BP 123/79 | HR 81 | Temp 98.3°F | Resp 16 | Ht 72.0 in | Wt 192.2 lb

## 2013-12-23 DIAGNOSIS — E1165 Type 2 diabetes mellitus with hyperglycemia: Secondary | ICD-10-CM

## 2013-12-23 DIAGNOSIS — I2589 Other forms of chronic ischemic heart disease: Secondary | ICD-10-CM | POA: Insufficient documentation

## 2013-12-23 DIAGNOSIS — E785 Hyperlipidemia, unspecified: Secondary | ICD-10-CM | POA: Insufficient documentation

## 2013-12-23 DIAGNOSIS — I10 Essential (primary) hypertension: Secondary | ICD-10-CM | POA: Insufficient documentation

## 2013-12-23 DIAGNOSIS — Z23 Encounter for immunization: Secondary | ICD-10-CM | POA: Insufficient documentation

## 2013-12-23 DIAGNOSIS — Z760 Encounter for issue of repeat prescription: Secondary | ICD-10-CM | POA: Insufficient documentation

## 2013-12-23 DIAGNOSIS — E119 Type 2 diabetes mellitus without complications: Secondary | ICD-10-CM | POA: Insufficient documentation

## 2013-12-23 LAB — POCT GLYCOSYLATED HEMOGLOBIN (HGB A1C): Hemoglobin A1C: 9.2

## 2013-12-23 LAB — LIPID PANEL
Cholesterol: 271 mg/dL — ABNORMAL HIGH (ref 0–200)
HDL: 38 mg/dL — AB (ref >39)
TRIGLYCERIDES: 487 mg/dL — AB (ref <150)
Total CHOL/HDL Ratio: 7.1 Ratio

## 2013-12-23 LAB — COMPLETE METABOLIC PANEL WITH GFR
ALT: 17 U/L (ref 0–53)
AST: 23 U/L (ref 0–37)
Albumin: 4.3 g/dL (ref 3.5–5.2)
Alkaline Phosphatase: 60 U/L (ref 39–117)
BILIRUBIN TOTAL: 0.4 mg/dL (ref 0.2–1.2)
BUN: 14 mg/dL (ref 6–23)
CO2: 28 mEq/L (ref 19–32)
Calcium: 9.4 mg/dL (ref 8.4–10.5)
Chloride: 101 mEq/L (ref 96–112)
Creat: 0.83 mg/dL (ref 0.50–1.35)
GLUCOSE: 179 mg/dL — AB (ref 70–99)
Potassium: 4.2 mEq/L (ref 3.5–5.3)
Sodium: 137 mEq/L (ref 135–145)
TOTAL PROTEIN: 7.5 g/dL (ref 6.0–8.3)

## 2013-12-23 LAB — GLUCOSE, POCT (MANUAL RESULT ENTRY): POC Glucose: 185 mg/dl — AB (ref 70–99)

## 2013-12-23 MED ORDER — LISINOPRIL 2.5 MG PO TABS: 2.5000 mg | ORAL_TABLET | Freq: Every day | ORAL | Status: DC

## 2013-12-23 MED ORDER — DAPAGLIFLOZIN PROPANEDIOL 10 MG PO TABS: 10.0000 mg | ORAL_TABLET | Freq: Every day | ORAL | Status: DC

## 2013-12-23 NOTE — Progress Notes (Signed)
Patient ID: Jason Stevenson, male   DOB: 13-Feb-1986, 28 y.o.   MRN: [id number]   Jason Stevenson, is a 28 y.o. male  [id number]  [id number]  DOB - 1985-10-31  Chief Complaint  Patient presents with  . Follow-up  . Hypertension  . Diabetes        Subjective:   Jason Stevenson is a 28 y.o. male here today for a follow up visit. Patient has history of diabetes mellitus, hypertension, nonischemic cardiomyopathy and hyperlipidemia, with poor medication compliance here today for followup and for medication refill. He has no new complaint. Patient presents for 3 month f/u on DM, HTN, and hyperlipidemia. States he has not taken aspirin 81 mg in 8 months. States he was told to stop metformin when he started [place]. Patient has No headache, No chest pain, No abdominal pain - No Nausea, No new weakness tingling or numbness, No Cough - SOB.  Problem  Need for Prophylactic Vaccination and Inoculation Against Influenza    ALLERGIES: No Known Allergies  PAST MEDICAL HISTORY: Past Medical History  Diagnosis Date  . Diabetes mellitus without complication   . Hypertension   . Nonischemic cardiomyopathy Noted as early as 2007    Per chart review (cards consult note 2011), EF of 40% in 2007, down to 20-25% in 2011  . Hyperlipidemia     MEDICATIONS AT HOME: Prior to Admission medications   Medication Sig Start Date End Date Taking? Authorizing Provider  atorvastatin (LIPITOR) 40 MG tablet Take 1 tablet (40 mg total) by mouth daily at 6 PM. 09/27/13  Yes Rowan Blaker E Hyman Hopes, MD  carvedilol (COREG) 6.25 MG tablet Take 1 tablet (6.25 mg total) by mouth 2 (two) times daily with a meal. 11/18/13  Yes Quentin Angst, MD  Dapagliflozin Propanediol (FARXIGA) 10 MG TABS Take 10 mg by mouth daily. 12/23/13  Yes Ichelle Harral Annitta Needs, MD  insulin aspart (NOVOLOG) 100 UNIT/ML injection Inject 10-50 Units into the skin 3 (three) times daily with meals. 06/09/13  Yes Quentin Angst, MD    Insulin Syringes, Disposable, U-100 1 ML MISC Insulin syringes 30-gauge 11/20/12  Yes Ripudeep K Rai, MD  lisinopril (PRINIVIL,ZESTRIL) 2.5 MG tablet Take 1 tablet (2.5 mg total) by mouth daily. 12/23/13  Yes Quentin Angst, MD  nitroGLYCERIN (NITROSTAT) 0.4 MG SL tablet Place 1 tablet (0.4 mg total) under the tongue every 5 (five) minutes as needed for chest pain. 12/16/12  Yes Beatrice Lecher, PA-C  aspirin 81 MG chewable tablet Chew 1 tablet (81 mg total) by mouth daily. 11/06/12   Jacquelin Hawking, MD  metFORMIN (GLUCOPHAGE) 500 MG tablet Take 1 tablet (500 mg total) by mouth 2 (two) times daily with a meal. 03/22/13   Quentin Angst, MD     Objective:   Filed Vitals:   12/23/13 1102  BP: 123/79  Pulse: 81  Temp: 98.3 F (36.8 C)  TempSrc: Oral  Resp: 16  Height: 6' (1.829 m)  Weight: 192 lb 3.2 oz (87.181 kg)  SpO2: 99%    Exam General appearance : Awake, alert, not in any distress. Speech Clear. Not toxic looking HEENT: Atraumatic and Normocephalic, pupils equally reactive to light and accomodation Neck: supple, no JVD. No cervical lymphadenopathy.  Chest:Good air entry bilaterally, no added sounds  CVS: S1 S2 regular, no murmurs.  Abdomen: Bowel sounds present, Non tender and not distended with no gaurding, rigidity or rebound. Extremities: B/L Lower Ext shows no edema, both legs are warm to touch Neurology: Awake  alert, and oriented X 3, CN II-XII intact, Non focal Skin:No Rash Wounds:N/A  Data Review Lab Results  Component Value Date   HGBA1C 9.2 12/23/2013   HGBA1C 9.3 09/20/2013   HGBA1C 11.8 06/17/2013     Assessment & Plan   1. Need for prophylactic vaccination and inoculation against influenza Given  2. Type 2 diabetes mellitus with hyperglycemia  - HgB A1c is 9.2% today as against 9.3% 3 months ago - Glucose (CBG) - Dapagliflozin Propanediol (FARXIGA) 10 MG TABS; Take 10 mg by mouth daily.  Dispense: 90 tablet; Refill: 3 - Continue metformin at the  current dose - Diabetic diet reinforced  3. Essential hypertension  - lisinopril (PRINIVIL,ZESTRIL) 2.5 MG tablet; Take 1 tablet (2.5 mg total) by mouth daily.  Dispense: 90 tablet; Refill: 3 - COMPLETE METABOLIC PANEL WITH GFR - DASH diet  4. HLD (hyperlipidemia)  - Lipid panel - Behavioral modification and low cholesterol low fat diet reinforced  Patient was counseled extensively about nutrition and exercise Patient was again warned of the necessity of compliance with medications and consequences of uncontrolled diabetes and hypertension  Return in about 3 months (around 03/24/2014), or if symptoms worsen or fail to improve, for Hemoglobin A1C and Follow up, DM, Annual Physical.  The patient was given clear instructions to go to ER or return to medical center if symptoms don't improve, worsen or new problems develop. The patient verbalized understanding. The patient was told to call to get lab results if they haven't heard anything in the next week.   This note has been created with Education officer, environmental. Any transcriptional errors are unintentional.    Jeanann Lewandowsky, MD, MHA, FACP, FAAP [facility] [place], [place] [phone]   12/23/2013, 11:48 AM

## 2013-12-23 NOTE — Patient Instructions (Signed)
DASH Eating Plan DASH stands for "Dietary Approaches to Stop Hypertension." The DASH eating plan is a healthy eating plan that has been shown to reduce high blood pressure (hypertension). Additional health benefits may include reducing the risk of type 2 diabetes mellitus, heart disease, and stroke. The DASH eating plan may also help with weight loss. WHAT DO I NEED TO KNOW ABOUT THE DASH EATING PLAN? For the DASH eating plan, you will follow these general guidelines:  Choose foods with a percent daily value for sodium of less than 5% (as listed on the food label).  Use salt-free seasonings or herbs instead of table salt or sea salt.  Check with your health care provider or pharmacist before using salt substitutes.  Eat lower-sodium products, often labeled as "lower sodium" or "no salt added."  Eat fresh foods.  Eat more vegetables, fruits, and low-fat dairy products.  Choose whole grains. Look for the word "whole" as the first word in the ingredient list.  Choose fish and skinless chicken or turkey more often than red meat. Limit fish, poultry, and meat to 6 oz (170 g) each day.  Limit sweets, desserts, sugars, and sugary drinks.  Choose heart-healthy fats.  Limit cheese to 1 oz (28 g) per day.  Eat more home-cooked food and less restaurant, buffet, and fast food.  Limit fried foods.  Cook foods using methods other than frying.  Limit canned vegetables. If you do use them, rinse them well to decrease the sodium.  When eating at a restaurant, ask that your food be prepared with less salt, or no salt if possible. WHAT FOODS CAN I EAT? Seek help from a dietitian for individual calorie needs. Grains Whole grain or whole wheat bread. Brown rice. Whole grain or whole wheat pasta. Quinoa, bulgur, and whole grain cereals. Low-sodium cereals. Corn or whole wheat flour tortillas. Whole grain cornbread. Whole grain crackers. Low-sodium crackers. Vegetables Fresh or frozen vegetables  (raw, steamed, roasted, or grilled). Low-sodium or reduced-sodium tomato and vegetable juices. Low-sodium or reduced-sodium tomato sauce and paste. Low-sodium or reduced-sodium canned vegetables.  Fruits All fresh, canned (in natural juice), or frozen fruits. Meat and Other Protein Products Ground beef (85% or leaner), grass-fed beef, or beef trimmed of fat. Skinless chicken or turkey. Ground chicken or turkey. Pork trimmed of fat. All fish and seafood. Eggs. Dried beans, peas, or lentils. Unsalted nuts and seeds. Unsalted canned beans. Dairy Low-fat dairy products, such as skim or 1% milk, 2% or reduced-fat cheeses, low-fat ricotta or cottage cheese, or plain low-fat yogurt. Low-sodium or reduced-sodium cheeses. Fats and Oils Tub margarines without trans fats. Light or reduced-fat mayonnaise and salad dressings (reduced sodium). Avocado. Safflower, olive, or canola oils. Natural peanut or almond butter. Other Unsalted popcorn and pretzels. The items listed above may not be a complete list of recommended foods or beverages. Contact your dietitian for more options. WHAT FOODS ARE NOT RECOMMENDED? Grains White bread. White pasta. White rice. Refined cornbread. Bagels and croissants. Crackers that contain trans fat. Vegetables Creamed or fried vegetables. Vegetables in a cheese sauce. Regular canned vegetables. Regular canned tomato sauce and paste. Regular tomato and vegetable juices. Fruits Dried fruits. Canned fruit in light or heavy syrup. Fruit juice. Meat and Other Protein Products Fatty cuts of meat. Ribs, chicken wings, bacon, sausage, bologna, salami, chitterlings, fatback, hot dogs, bratwurst, and packaged luncheon meats. Salted nuts and seeds. Canned beans with salt. Dairy Whole or 2% milk, cream, half-and-half, and cream cheese. Whole-fat or sweetened yogurt. Full-fat   cheeses or blue cheese. Nondairy creamers and whipped toppings. Processed cheese, cheese spreads, or cheese  curds. Condiments Onion and garlic salt, seasoned salt, table salt, and sea salt. Canned and packaged gravies. Worcestershire sauce. Tartar sauce. Barbecue sauce. Teriyaki sauce. Soy sauce, including reduced sodium. Steak sauce. Fish sauce. Oyster sauce. Cocktail sauce. Horseradish. Ketchup and mustard. Meat flavorings and tenderizers. Bouillon cubes. Hot sauce. Tabasco sauce. Marinades. Taco seasonings. Relishes. Fats and Oils Butter, stick margarine, lard, shortening, ghee, and bacon fat. Coconut, palm kernel, or palm oils. Regular salad dressings. Other Pickles and olives. Salted popcorn and pretzels. The items listed above may not be a complete list of foods and beverages to avoid. Contact your dietitian for more information. WHERE CAN I FIND MORE INFORMATION? National Heart, Lung, and Blood Institute: www.nhlbi.nih.gov/health/health-topics/topics/dash/ Document Released: 03/21/2011 Document Revised: 08/16/2013 Document Reviewed: 02/03/2013 ExitCare Patient Information 2015 [employer]. This information is not intended to replace advice given to you by your health care provider. Make sure you discuss any questions you have with your health care provider. Diabetes and Exercise Exercising regularly is important. It is not just about losing weight. It has many health benefits, such as:  Improving your overall fitness, flexibility, and endurance.  Increasing your bone density.  Helping with weight control.  Decreasing your body fat.  Increasing your muscle strength.  Reducing stress and tension.  Improving your overall health. People with diabetes who exercise gain additional benefits because exercise:  Reduces appetite.  Improves the body's use of blood sugar (glucose).  Helps lower or control blood glucose.  Decreases blood pressure.  Helps control blood lipids (such as cholesterol and triglycerides).  Improves the body's use of the hormone insulin by:  Increasing the  body's insulin sensitivity.  Reducing the body's insulin needs.  Decreases the risk for heart disease because exercising:  Lowers cholesterol and triglycerides levels.  Increases the levels of good cholesterol (such as high-density lipoproteins [HDL]) in the body.  Lowers blood glucose levels. YOUR ACTIVITY PLAN  Choose an activity that you enjoy and set realistic goals. Your health care provider or diabetes educator can help you make an activity plan that works for you. Exercise regularly as directed by your health care provider. This includes:  Performing resistance training twice a week such as push-ups, sit-ups, lifting weights, or using resistance bands.  Performing 150 minutes of cardio exercises each week such as walking, running, or playing sports.  Staying active and spending no more than 90 minutes at one time being inactive. Even short bursts of exercise are good for you. Three 10-minute sessions spread throughout the day are just as beneficial as a single 30-minute session. Some exercise ideas include:  Taking the dog for a walk.  Taking the stairs instead of the elevator.  Dancing to your favorite song.  Doing an exercise video.  Doing your favorite exercise with a friend. RECOMMENDATIONS FOR EXERCISING WITH TYPE 1 OR TYPE 2 DIABETES   Check your blood glucose before exercising. If blood glucose levels are greater than 240 mg/dL, check for urine ketones. Do not exercise if ketones are present.  Avoid injecting insulin into areas of the body that are going to be exercised. For example, avoid injecting insulin into:  The arms when playing tennis.  The legs when jogging.  Keep a record of:  Food intake before and after you exercise.  Expected peak times of insulin action.  Blood glucose levels before and after you exercise.  The type and amount of exercise   you have done.  Review your records with your health care provider. Your health care provider will  help you to develop guidelines for adjusting food intake and insulin amounts before and after exercising.  If you take insulin or oral hypoglycemic agents, watch for signs and symptoms of hypoglycemia. They include:  Dizziness.  Shaking.  Sweating.  Chills.  Confusion.  Drink plenty of water while you exercise to prevent dehydration or heat stroke. Body water is lost during exercise and must be replaced.  Talk to your health care provider before starting an exercise program to make sure it is safe for you. Remember, almost any type of activity is better than none. Document Released: 06/22/2003 Document Revised: 08/16/2013 Document Reviewed: 09/08/2012 ExitCare Patient Information 2015 ExitCare[employer]. This information is not intended to replace advice given to you by your health care provider. Make sure you discuss any questions you have with your health care provider. Diabetes Mellitus and Food It is important for you to manage your blood sugar (glucose) level. Your blood glucose level can be greatly affected by what you eat. Eating healthier foods in the appropriate amounts throughout the day at about the same time each day will help you control your blood glucose level. It can also help slow or prevent worsening of your diabetes mellitus. Healthy eating may even help you improve the level of your blood pressure and reach or maintain a healthy weight.  HOW CAN FOOD AFFECT ME? Carbohydrates Carbohydrates affect your blood glucose level more than any other type of food. Your dietitian will help you determine how many carbohydrates to eat at each meal and teach you how to count carbohydrates. Counting carbohydrates is important to keep your blood glucose at a healthy level, especially if you are using insulin or taking certain medicines for diabetes mellitus. Alcohol Alcohol can cause sudden decreases in blood glucose (hypoglycemia), especially if you use insulin or take certain medicines for  diabetes mellitus. Hypoglycemia can be a life-threatening condition. Symptoms of hypoglycemia (sleepiness, dizziness, and disorientation) are similar to symptoms of having too much alcohol.  If your health care provider has given you approval to drink alcohol, do so in moderation and use the following guidelines:  Women should not have more than one drink per day, and men should not have more than two drinks per day. One drink is equal to:  12 oz of beer.  5 oz of wine.  1 oz of hard liquor.  Do not drink on an empty stomach.  Keep yourself hydrated. Have water, diet soda, or unsweetened iced tea.  Regular soda, juice, and other mixers might contain a lot of carbohydrates and should be counted. WHAT FOODS ARE NOT RECOMMENDED? As you make food choices, it is important to remember that all foods are not the same. Some foods have fewer nutrients per serving than other foods, even though they might have the same number of calories or carbohydrates. It is difficult to get your body what it needs when you eat foods with fewer nutrients. Examples of foods that you should avoid that are high in calories and carbohydrates but low in nutrients include:  Trans fats (most processed foods list trans fats on the Nutrition Facts label).  Regular soda.  Juice.  Candy.  Sweets, such as cake, pie, doughnuts, and cookies.  Fried foods. WHAT FOODS CAN I EAT? Have nutrient-rich foods, which will nourish your body and keep you healthy. The food you should eat also will depend on several factors,   including:  The calories you need.  The medicines you take.  Your weight.  Your blood glucose level.  Your blood pressure level.  Your cholesterol level. You also should eat a variety of foods, including:  Protein, such as meat, poultry, fish, tofu, nuts, and seeds (lean animal proteins are best).  Fruits.  Vegetables.  Dairy products, such as milk, cheese, and yogurt (low fat is  best).  Breads, grains, pasta, cereal, rice, and beans.  Fats such as olive oil, trans fat-free margarine, canola oil, avocado, and olives. DOES EVERYONE WITH DIABETES MELLITUS HAVE THE SAME MEAL PLAN? Because every person with diabetes mellitus is different, there is not one meal plan that works for everyone. It is very important that you meet with a dietitian who will help you create a meal plan that is just right for you. Document Released: 12/27/2004 Document Revised: 04/06/2013 Document Reviewed: 02/26/2013 ExitCare Patient Information 2015 [employer]. This information is not intended to replace advice given to you by your health care provider. Make sure you discuss any questions you have with your health care provider.  

## 2013-12-23 NOTE — Progress Notes (Signed)
Patient presents for 3 month f/u on DM, HTN, and hyperlipidemia States he has not taken aspirin 81 mg in 8 months States he was told to stop metformin when he started [place]

## 2014-01-07 ENCOUNTER — Telehealth: Payer: Self-pay | Admitting: Emergency Medicine

## 2014-01-07 NOTE — Telephone Encounter (Signed)
Left message for pt to call to schedule blood work

## 2014-01-07 NOTE — Telephone Encounter (Signed)
Message copied by Darlis Loan on Fri Jan 07, 2014  5:05 PM ------      Message from: Jeanann Lewandowsky E      Created: Wed Jan 05, 2014 10:02 AM       Please inform patient that his lab results shows a very high triglyceride level, a form of cholesterol. We need to confirm this level with fasting blood sample.            Please have for a fasting lipid panel as the lab visit, thank you. ------

## 2014-01-11 ENCOUNTER — Telehealth: Payer: Self-pay | Admitting: Internal Medicine

## 2014-01-11 ENCOUNTER — Telehealth: Payer: Self-pay | Admitting: Emergency Medicine

## 2014-01-11 DIAGNOSIS — E785 Hyperlipidemia, unspecified: Secondary | ICD-10-CM

## 2014-01-11 NOTE — Telephone Encounter (Signed)
Patient requests to let him know about lab tests results. Please f/u with Patient.

## 2014-01-11 NOTE — Telephone Encounter (Signed)
Pt given test results. Set up lab appt for pt to get repeat Lipid panel tomorrow for accuracy

## 2014-01-12 ENCOUNTER — Ambulatory Visit: Payer: Self-pay | Attending: Internal Medicine

## 2014-01-12 DIAGNOSIS — E785 Hyperlipidemia, unspecified: Secondary | ICD-10-CM

## 2014-01-12 LAB — LIPID PANEL
Cholesterol: 252 mg/dL — ABNORMAL HIGH (ref 0–200)
HDL: 42 mg/dL (ref >39)
LDL Cholesterol: 187 mg/dL — ABNORMAL HIGH (ref 0–99)
TRIGLYCERIDES: 116 mg/dL (ref <150)
Total CHOL/HDL Ratio: 6 Ratio
VLDL: 23 mg/dL (ref 0–40)

## 2014-01-20 ENCOUNTER — Ambulatory Visit: Payer: Self-pay | Admitting: Internal Medicine

## 2014-02-17 ENCOUNTER — Encounter: Payer: Self-pay | Admitting: Internal Medicine

## 2014-02-17 ENCOUNTER — Ambulatory Visit: Payer: Self-pay | Attending: Internal Medicine | Admitting: Internal Medicine

## 2014-02-17 VITALS — BP 125/85 | HR 75 | Resp 16 | Ht 72.0 in | Wt 187.0 lb

## 2014-02-17 DIAGNOSIS — I429 Cardiomyopathy, unspecified: Secondary | ICD-10-CM | POA: Insufficient documentation

## 2014-02-17 DIAGNOSIS — E785 Hyperlipidemia, unspecified: Secondary | ICD-10-CM

## 2014-02-17 DIAGNOSIS — Z794 Long term (current) use of insulin: Secondary | ICD-10-CM | POA: Insufficient documentation

## 2014-02-17 DIAGNOSIS — Z7982 Long term (current) use of aspirin: Secondary | ICD-10-CM | POA: Insufficient documentation

## 2014-02-17 DIAGNOSIS — E119 Type 2 diabetes mellitus without complications: Secondary | ICD-10-CM

## 2014-02-17 DIAGNOSIS — I10 Essential (primary) hypertension: Secondary | ICD-10-CM | POA: Insufficient documentation

## 2014-02-17 DIAGNOSIS — E784 Other hyperlipidemia: Secondary | ICD-10-CM

## 2014-02-17 LAB — GLUCOSE, POCT (MANUAL RESULT ENTRY): POC Glucose: 101 mg/dL — AB (ref 70–99)

## 2014-02-17 MED ORDER — ATORVASTATIN CALCIUM 40 MG PO TABS: 40.0000 mg | ORAL_TABLET | Freq: Every day | ORAL | Status: DC

## 2014-02-17 NOTE — Progress Notes (Signed)
Patient ID: Jason Stevenson, male   DOB: 06/25/1985, 28 y.o.   MRN: [id number]   Jason SessionsGerald Rueb, is a 28 y.o. male  [id number]  [id number]  DOB - 02/15/1986  Chief Complaint  Patient presents with  . Follow-up        Subjective:   Jason Stevenson is a 28 y.o. male here today for a follow up visit. PMH significant for DM on metformin and farxiga, HTN on coreg and lisinopril, non-ischemic CM, hyperlipidemia on atrovastatin.  He reports compliance with medications.  He has been checking his blood sugars at home and has noticed some improvements since his last appt with his blood sugars averaging below 150.  He reports 1 hypoglycemic event as evidenced by jitteriness which resolved with carbohydrate consumption.  He does not have any complaints today.  He is requesting a refill of atrovastatin. He has never been to an ophthalmologist or podiatrist.   No problems updated.  ALLERGIES: No Known Allergies  PAST MEDICAL HISTORY: Past Medical History  Diagnosis Date  . Diabetes mellitus without complication   . Hypertension   . Nonischemic cardiomyopathy Noted as early as 2007    Per chart review (cards consult note 2011), EF of 40% in 2007, down to 20-25% in 2011  . Hyperlipidemia     MEDICATIONS AT HOME: Prior to Admission medications   Medication Sig Start Date End Date Taking? Authorizing Provider  atorvastatin (LIPITOR) 40 MG tablet Take 1 tablet (40 mg total) by mouth daily at 6 PM. 02/17/14  Yes Olugbemiga E Hyman HopesJegede, MD  carvedilol (COREG) 6.25 MG tablet Take 1 tablet (6.25 mg total) by mouth 2 (two) times daily with a meal. 11/18/13  Yes Quentin Angstlugbemiga E Jegede, MD  Dapagliflozin Propanediol (FARXIGA) 10 MG TABS Take 10 mg by mouth daily. 12/23/13  Yes Olugbemiga Annitta NeedsE Jegede, MD  insulin aspart (NOVOLOG) 100 UNIT/ML injection Inject 10-50 Units into the skin 3 (three) times daily with meals. 06/09/13  Yes Quentin Angstlugbemiga E Jegede, MD  Insulin Syringes, Disposable, U-100 1 ML MISC  Insulin syringes 30-gauge 11/20/12  Yes Ripudeep K Rai, MD  lisinopril (PRINIVIL,ZESTRIL) 2.5 MG tablet Take 1 tablet (2.5 mg total) by mouth daily. 12/23/13  Yes Quentin Angstlugbemiga E Jegede, MD  metFORMIN (GLUCOPHAGE) 500 MG tablet Take 1 tablet (500 mg total) by mouth 2 (two) times daily with a meal. 03/22/13  Yes Quentin Angstlugbemiga E Jegede, MD  nitroGLYCERIN (NITROSTAT) 0.4 MG SL tablet Place 1 tablet (0.4 mg total) under the tongue every 5 (five) minutes as needed for chest pain. 12/16/12  Yes Beatrice LecherScott T Weaver, PA-C  aspirin 81 MG chewable tablet Chew 1 tablet (81 mg total) by mouth daily. 11/06/12   Jacquelin Hawkingalph Nettey, MD   Review of Systems  Constitutional: Negative.   HENT: Negative.   Eyes: Negative.   Respiratory: Negative.   Cardiovascular: Negative.   Gastrointestinal: Negative.   Genitourinary: Negative.   Musculoskeletal: Negative.   Skin: Negative.   Neurological: Negative.   Endo/Heme/Allergies: Negative.   Psychiatric/Behavioral: Negative.      Objective:   Filed Vitals:   02/17/14 1555  BP: 125/85  Pulse: 75  Resp: 16  Height: 6' (1.829 m)  Weight: 187 lb (84.823 kg)  SpO2: 94%    Exam General appearance : Awake, alert, not in any distress. Speech Clear. Not toxic looking HEENT: Atraumatic and Normocephalic, pupils equally reactive to light and accomodation Neck: supple, no JVD. No cervical lymphadenopathy.  Chest:Good air entry bilaterally, no added sounds  CVS: S1 S2 regular,  no murmurs.  Abdomen: Bowel sounds present, Non tender and not distended with no gaurding, rigidity or rebound. Extremities: B/L Lower Ext shows no edema, both legs are warm to touch Neurology: Awake alert, and oriented X 3, CN II-XII intact, Non focal Skin:No Rash Wounds:N/A  Data Review Lab Results  Component Value Date   HGBA1C 9.2 12/23/2013   HGBA1C 9.3 09/20/2013   HGBA1C 11.8 06/17/2013     Assessment & Plan   1. Type 2 diabetes mellitus without complication P: - Glucose (CBG) - continue  on metformin and farxiga - opthalmology and podiatry referral   Aim for 2-3 Carb Choices per meal (30-45 grams) +/- 1 either way  Aim for 0-15 Carbs per snack if hungry  Include protein in moderation with your meals and snacks  Consider reading food labels for Total Carbohydrate and Fat Grams of foods  Consider checking BG at alternate times per day  Continue taking medication as directed Fruit Punch - find one with no sugar  Measure and decrease portions of carbohydrate foods  Make your plate and don't go back for seconds   3. Dyslipidemia (high LDL; low HDL)  - atorvastatin (LIPITOR) 40 MG tablet; Take 1 tablet (40 mg total) by mouth daily at 6 PM.  Dispense: 90 tablet; Refill: 3  To address this please limit saturated fat to no more than 7% of your calories, limit cholesterol to 200 mg/day, increase fiber and exercise as tolerated. If needed we may add another cholesterol lowering medication to your regimen.   Return in about 2 months (around 04/19/2014), or if symptoms worsen or fail to improve, for Hemoglobin A1C and Follow up, DM.  The patient was given clear instructions to go to ER or return to medical center if symptoms don't improve, worsen or new problems develop. The patient verbalized understanding. The patient was told to call to get lab results if they haven't heard anything in the next week.   This note has been created with Education officer, environmental. Any transcriptional errors are unintentional.    Jordy Verba, FNP-student  Evaluation and management procedures were performed by the Advanced Practitioner under my supervision and collaboration. I have reviewed the Advanced Practitioner's note and chart, and I agree with the management and plan.   Jeanann Lewandowsky, MD, MHA, FACP, FAAP [facility] [place], [place] [phone]   02/17/2014, 4:04 PM

## 2014-02-17 NOTE — Patient Instructions (Addendum)
Diabetes and Exercise Exercising regularly is important. It is not just about losing weight. It has many health benefits, such as:  Improving your overall fitness, flexibility, and endurance.  Increasing your bone density.  Helping with weight control.  Decreasing your body fat.  Increasing your muscle strength.  Reducing stress and tension.  Improving your overall health. People with diabetes who exercise gain additional benefits because exercise:  Reduces appetite.  Improves the body's use of blood sugar (glucose).  Helps lower or control blood glucose.  Decreases blood pressure.  Helps control blood lipids (such as cholesterol and triglycerides).  Improves the body's use of the hormone insulin by:  Increasing the body's insulin sensitivity.  Reducing the body's insulin needs.  Decreases the risk for heart disease because exercising:  Lowers cholesterol and triglycerides levels.  Increases the levels of good cholesterol (such as high-density lipoproteins [HDL]) in the body.  Lowers blood glucose levels. YOUR ACTIVITY PLAN  Choose an activity that you enjoy and set realistic goals. Your health care provider or diabetes educator can help you make an activity plan that works for you. Exercise regularly as directed by your health care provider. This includes:  Performing resistance training twice a week such as push-ups, sit-ups, lifting weights, or using resistance bands.  Performing 150 minutes of cardio exercises each week such as walking, running, or playing sports.  Staying active and spending no more than 90 minutes at one time being inactive. Even short bursts of exercise are good for you. Three 10-minute sessions spread throughout the day are just as beneficial as a single 30-minute session. Some exercise ideas include:  Taking the dog for a walk.  Taking the stairs instead of the elevator.  Dancing to your favorite song.  Doing an exercise  video.  Doing your favorite exercise with a friend. RECOMMENDATIONS FOR EXERCISING WITH TYPE 1 OR TYPE 2 DIABETES   Check your blood glucose before exercising. If blood glucose levels are greater than 240 mg/dL, check for urine ketones. Do not exercise if ketones are present.  Avoid injecting insulin into areas of the body that are going to be exercised. For example, avoid injecting insulin into:  The arms when playing tennis.  The legs when jogging.  Keep a record of:  Food intake before and after you exercise.  Expected peak times of insulin action.  Blood glucose levels before and after you exercise.  The type and amount of exercise you have done.  Review your records with your health care provider. Your health care provider will help you to develop guidelines for adjusting food intake and insulin amounts before and after exercising.  If you take insulin or oral hypoglycemic agents, watch for signs and symptoms of hypoglycemia. They include:  Dizziness.  Shaking.  Sweating.  Chills.  Confusion.  Drink plenty of water while you exercise to prevent dehydration or heat stroke. Body water is lost during exercise and must be replaced.  Talk to your health care provider before starting an exercise program to make sure it is safe for you. Remember, almost any type of activity is better than none. Document Released: 06/22/2003 Document Revised: 08/16/2013 Document Reviewed: 09/08/2012 ExitCare Patient Information 2015 [employer]. This information is not intended to replace advice given to you by your health care provider. Make sure you discuss any questions you have with your health care provider. Basic Carbohydrate Counting for Diabetes Mellitus Carbohydrate counting is a method for keeping track of the amount of carbohydrates you eat.   Eating carbohydrates naturally increases the level of sugar (glucose) in your blood, so it is important for you to know the amount that is  okay for you to have in every meal. Carbohydrate counting helps keep the level of glucose in your blood within normal limits. The amount of carbohydrates allowed is different for every person. A dietitian can help you calculate the amount that is right for you. Once you know the amount of carbohydrates you can have, you can count the carbohydrates in the foods you want to eat. Carbohydrates are found in the following foods:  Grains, such as breads and cereals.  Dried beans and soy products.  Starchy vegetables, such as potatoes, peas, and corn.  Fruit and fruit juices.  Milk and yogurt.  Sweets and snack foods, such as cake, cookies, candy, chips, soft drinks, and fruit drinks. CARBOHYDRATE COUNTING There are two ways to count the carbohydrates in your food. You can use either of the methods or a combination of both. Reading the "Nutrition Facts" on Packaged Food The "Nutrition Facts" is an area that is included on the labels of almost all packaged food and beverages in the [place]. It includes the serving size of that food or beverage and information about the nutrients in each serving of the food, including the grams (g) of carbohydrate per serving.  Decide the number of servings of this food or beverage that you will be able to eat or drink. Multiply that number of servings by the number of grams of carbohydrate that is listed on the label for that serving. The total will be the amount of carbohydrates you will be having when you eat or drink this food or beverage. Learning Standard Serving Sizes of Food When you eat food that is not packaged or does not include "Nutrition Facts" on the label, you need to measure the servings in order to count the amount of carbohydrates.A serving of most carbohydrate-rich foods contains about 15 g of carbohydrates. The following list includes serving sizes of carbohydrate-rich foods that provide 15 g ofcarbohydrate per serving:   1 slice of bread  (1 oz) or 1 six-inch tortilla.    of a hamburger bun or English muffin.  4-6 crackers.   cup unsweetened dry cereal.    cup hot cereal.   cup rice or pasta.    cup mashed potatoes or  of a large baked potato.  1 cup fresh fruit or one small piece of fruit.    cup canned or frozen fruit or fruit juice.  1 cup milk.   cup plain fat-free yogurt or yogurt sweetened with artificial sweeteners.   cup cooked dried beans or starchy vegetable, such as peas, corn, or potatoes.  Decide the number of standard-size servings that you will eat. Multiply that number of servings by 15 (the grams of carbohydrates in that serving). For example, if you eat 2 cups of strawberries, you will have eaten 2 servings and 30 g of carbohydrates (2 servings x 15 g = 30 g). For foods such as soups and casseroles, in which more than one food is mixed in, you will need to count the carbohydrates in each food that is included. EXAMPLE OF CARBOHYDRATE COUNTING Sample Dinner  3 oz chicken breast.   cup of brown rice.   cup of corn.  1 cup milk.   1 cup strawberries with sugar-free whipped topping.  Carbohydrate Calculation Step 1: Identify the foods that contain carbohydrates:   Rice.   Corn.     Milk.   Strawberries. Step 2:Calculate the number of servings eaten of each:   2 servings of rice.   1 serving of corn.   1 serving of milk.   1 serving of strawberries. Step 3: Multiply each of those number of servings by 15 g:   2 servings of rice x 15 g = 30 g.   1 serving of corn x 15 g = 15 g.   1 serving of milk x 15 g = 15 g.   1 serving of strawberries x 15 g = 15 g. Step 4: Add together all of the amounts to find the total grams of carbohydrates eaten: 30 g + 15 g + 15 g + 15 g = 75 g. Document Released: 04/01/2005 Document Revised: 08/16/2013 Document Reviewed: 02/26/2013 [facility] Patient Information 2015 [place], [place]. This information is not intended to  replace advice given to you by your health care provider. Make sure you discuss any questions you have with your health care provider. Dyslipidemia Dyslipidemia is an imbalance of the lipids in your blood. Lipids are waxy, fat-like proteins that your body needs in small amounts. Dyslipidemia often involves the lipids cholesterol or triglycerides. Common forms of dyslipidemia are:  High levels of bad cholesterol (LDL cholesterol). LDL cholesterol is the type of cholesterol that causes heart disease.  Low levels of good cholesterol (HDL cholesterol). HDL cholesterol is the type of cholesterol that helps protect against heart disease.  High levels of triglycerides. Triglycerides are a fatty substance in the blood linked to a buildup of plaque on your arteries. RISK FACTORS  Increased age.  Having a family history of high cholesterol.  Certain medicines, including birth control pills, diuretics, beta-blockers, and some medicines for depression.  Smoking.  Eating a high-fat diet.  Being overweight.  Medical conditions such as diabetes, polycystic ovary syndrome, pregnancy, kidney disease, and hypothyroidism.  Lack of regular exercise. SIGNS AND SYMPTOMS There are no signs or symptoms with dyslipidemia.  DIAGNOSIS  A simple blood test called a fasting blood test can be done to determine your level of:  Total cholesterol. This is the combined number of LDL cholesterol and HDL cholesterol. A healthy number is lower than 200.  LDL cholesterol. The goal number for LDL cholesterol is different for each person depending on risk factors. Ask your health care provider what your LDL cholesterol number should be.  HDL cholesterol. A healthy level of HDL cholesterol is 60 or higher. A number lower than 40 for men or 50 for women is a danger sign.  Triglycerides. A healthy triglyceride number is less than 150. TREATMENT  Dyslipidemia is a treatable condition. Your health care provider will  advise you on what type of treatment is best based on your age, your test results, and current guidelines. Treatment may include:   Dietary changes. A dietitian can help you create a meal plan. You may need to:  Eat more foods that contain omega-3s, such as salmon and other fish.  Replace saturated fats and trans fats in your diet with healthy fats such as nuts, seeds, avocados, olive oil, and canola oil.  Regular exercise. This can help lower your LDL cholesterol, raise your HDL cholesterol, and help with weight management. Check with your health care provider before beginning an exercise program. Most people should participate in 30 minutes of brisk exercise 5 days a week.  Quitting smoking.  Medicines to lower LDL cholesterol and triglycerides. Your health care provider will monitor your lipid levels with regular  blood tests. HOME CARE INSTRUCTIONS  Eat a healthy diet. Follow any diet instructions if they were given to you by your health care provider.  Maintain a healthy weight.  Exercise regularly based on the recommendations of your health care provider.  Do not use any tobacco products, including cigarettes, chewing tobacco, or electronic cigarettes.  Take medicines only as directed by your health care provider.  Keep all follow-up visits as directed by your health care provider. SEEK MEDICAL CARE IF: You are having possible side effects from your medicines. Document Released: 04/06/2013 Document Revised: 08/16/2013 Document Reviewed: 04/06/2013 [facility] Patient Information 2015 [place]. This information is not intended to replace advice given to you by your health care provider. Make sure you discuss any questions you have with your health care provider.

## 2014-02-17 NOTE — Progress Notes (Signed)
Pt is here following up on his HTN, diabetes and his hyperlipidemia. Pt states that he has no C.C. Today.

## 2014-05-13 ENCOUNTER — Other Ambulatory Visit: Payer: Self-pay | Admitting: Internal Medicine

## 2014-05-13 ENCOUNTER — Other Ambulatory Visit: Payer: Self-pay | Admitting: *Deleted

## 2014-05-13 MED ORDER — METFORMIN HCL 500 MG PO TABS: 500.0000 mg | ORAL_TABLET | Freq: Two times a day (BID) | ORAL | Status: DC

## 2014-05-13 NOTE — Telephone Encounter (Signed)
Patient called to request a med refill for his Metformin, please f/u with pt

## 2014-05-13 NOTE — Telephone Encounter (Signed)
Rx was send to [facility] pharmacy

## 2014-05-20 ENCOUNTER — Other Ambulatory Visit: Payer: Self-pay | Admitting: *Deleted

## 2014-05-20 MED ORDER — METFORMIN HCL 500 MG PO TABS: 500.0000 mg | ORAL_TABLET | Freq: Two times a day (BID) | ORAL | Status: DC

## 2014-05-20 NOTE — Progress Notes (Signed)
Pt needed a refill for his metformin.  Pt could not get his medication at our pharmacy so I sent it to the [facility] where he works.

## 2014-10-07 ENCOUNTER — Telehealth: Payer: Self-pay | Admitting: Internal Medicine

## 2014-10-07 NOTE — Telephone Encounter (Signed)
Patient called requesting pain medication for headaches, patient states he is on different kind of medication. Please f/u with patient

## 2014-10-10 ENCOUNTER — Other Ambulatory Visit: Payer: Self-pay

## 2014-11-10 ENCOUNTER — Encounter: Payer: Self-pay | Admitting: Internal Medicine

## 2014-11-10 ENCOUNTER — Ambulatory Visit: Payer: Self-pay | Attending: Internal Medicine | Admitting: Internal Medicine

## 2014-11-10 VITALS — BP 118/83 | HR 104 | Temp 97.9°F | Resp 18 | Ht 72.0 in | Wt 187.4 lb

## 2014-11-10 DIAGNOSIS — I429 Cardiomyopathy, unspecified: Secondary | ICD-10-CM | POA: Insufficient documentation

## 2014-11-10 DIAGNOSIS — E1165 Type 2 diabetes mellitus with hyperglycemia: Secondary | ICD-10-CM | POA: Insufficient documentation

## 2014-11-10 DIAGNOSIS — IMO0002 Reserved for concepts with insufficient information to code with codable children: Secondary | ICD-10-CM

## 2014-11-10 DIAGNOSIS — Z794 Long term (current) use of insulin: Secondary | ICD-10-CM | POA: Insufficient documentation

## 2014-11-10 DIAGNOSIS — E785 Hyperlipidemia, unspecified: Secondary | ICD-10-CM | POA: Insufficient documentation

## 2014-11-10 DIAGNOSIS — I428 Other cardiomyopathies: Secondary | ICD-10-CM

## 2014-11-10 LAB — COMPLETE METABOLIC PANEL WITH GFR
ALK PHOS: 66 U/L (ref 40–115)
ALT: 16 U/L (ref 9–46)
AST: 22 U/L (ref 10–40)
Albumin: 4.3 g/dL (ref 3.6–5.1)
BUN: 18 mg/dL (ref 7–25)
CHLORIDE: 106 meq/L (ref 98–110)
CO2: 23 meq/L (ref 20–31)
CREATININE: 0.8 mg/dL (ref 0.60–1.35)
Calcium: 9.5 mg/dL (ref 8.6–10.3)
GFR, Est Non African American: >89 mL/min (ref >=60)
Glucose, Bld: 52 mg/dL — ABNORMAL LOW (ref 65–99)
Potassium: 3.7 mEq/L (ref 3.5–5.3)
SODIUM: 140 meq/L (ref 135–146)
TOTAL PROTEIN: 7.5 g/dL (ref 6.1–8.1)
Total Bilirubin: 0.4 mg/dL (ref 0.2–1.2)

## 2014-11-10 LAB — LIPID PANEL
CHOL/HDL RATIO: 11.2 ratio — AB (ref <=5.0)
CHOLESTEROL: 370 mg/dL — AB (ref 125–200)
HDL: 33 mg/dL — ABNORMAL LOW (ref >=40)
LDL Cholesterol: 282 mg/dL — ABNORMAL HIGH (ref <130)
Triglycerides: 273 mg/dL — ABNORMAL HIGH (ref <150)
VLDL: 55 mg/dL — AB (ref <30)

## 2014-11-10 LAB — GLUCOSE, POCT (MANUAL RESULT ENTRY): POC Glucose: 176 mg/dl — AB (ref 70–99)

## 2014-11-10 LAB — POCT GLYCOSYLATED HEMOGLOBIN (HGB A1C): HEMOGLOBIN A1C: 14.5

## 2014-11-10 MED ORDER — DAPAGLIFLOZIN PROPANEDIOL 10 MG PO TABS: 10.0000 mg | ORAL_TABLET | Freq: Every day | ORAL | Status: DC

## 2014-11-10 MED ORDER — LISINOPRIL 2.5 MG PO TABS: 2.5000 mg | ORAL_TABLET | Freq: Every day | ORAL | Status: DC

## 2014-11-10 MED ORDER — METFORMIN HCL 1000 MG PO TABS: 1000.0000 mg | ORAL_TABLET | Freq: Two times a day (BID) | ORAL | Status: DC

## 2014-11-10 MED ORDER — INSULIN ASPART 100 UNIT/ML ~~LOC~~ SOLN: 10.0000 [IU] | Freq: Three times a day (TID) | SUBCUTANEOUS | Status: DC

## 2014-11-10 MED ORDER — CARVEDILOL 6.25 MG PO TABS
6.2500 mg | ORAL_TABLET | Freq: Two times a day (BID) | ORAL | Status: DC
Start: 2014-11-10 — End: 2015-02-20

## 2014-11-10 MED ORDER — ATORVASTATIN CALCIUM 40 MG PO TABS: 40.0000 mg | ORAL_TABLET | Freq: Every day | ORAL | Status: DC

## 2014-11-10 NOTE — Patient Instructions (Signed)
Diabetes and Exercise Exercising regularly is important. It is not just about losing weight. It has many health benefits, such as:  Improving your overall fitness, flexibility, and endurance.  Increasing your bone density.  Helping with weight control.  Decreasing your body fat.  Increasing your muscle strength.  Reducing stress and tension.  Improving your overall health. People with diabetes who exercise gain additional benefits because exercise:  Reduces appetite.  Improves the body's use of blood sugar (glucose).  Helps lower or control blood glucose.  Decreases blood pressure.  Helps control blood lipids (such as cholesterol and triglycerides).  Improves the body's use of the hormone insulin by:  Increasing the body's insulin sensitivity.  Reducing the body's insulin needs.  Decreases the risk for heart disease because exercising:  Lowers cholesterol and triglycerides levels.  Increases the levels of good cholesterol (such as high-density lipoproteins [HDL]) in the body.  Lowers blood glucose levels. YOUR ACTIVITY PLAN  Choose an activity that you enjoy and set realistic goals. Your health care provider or diabetes educator can help you make an activity plan that works for you. Exercise regularly as directed by your health care provider. This includes:  Performing resistance training twice a week such as push-ups, sit-ups, lifting weights, or using resistance bands.  Performing 150 minutes of cardio exercises each week such as walking, running, or playing sports.  Staying active and spending no more than 90 minutes at one time being inactive. Even short bursts of exercise are good for you. Three 10-minute sessions spread throughout the day are just as beneficial as a single 30-minute session. Some exercise ideas include:  Taking the dog for a walk.  Taking the stairs instead of the elevator.  Dancing to your favorite song.  Doing an exercise  video.  Doing your favorite exercise with a friend. RECOMMENDATIONS FOR EXERCISING WITH TYPE 1 OR TYPE 2 DIABETES   Check your blood glucose before exercising. If blood glucose levels are greater than 240 mg/dL, check for urine ketones. Do not exercise if ketones are present.  Avoid injecting insulin into areas of the body that are going to be exercised. For example, avoid injecting insulin into:  The arms when playing tennis.  The legs when jogging.  Keep a record of:  Food intake before and after you exercise.  Expected peak times of insulin action.  Blood glucose levels before and after you exercise.  The type and amount of exercise you have done.  Review your records with your health care provider. Your health care provider will help you to develop guidelines for adjusting food intake and insulin amounts before and after exercising.  If you take insulin or oral hypoglycemic agents, watch for signs and symptoms of hypoglycemia. They include:  Dizziness.  Shaking.  Sweating.  Chills.  Confusion.  Drink plenty of water while you exercise to prevent dehydration or heat stroke. Body water is lost during exercise and must be replaced.  Talk to your health care provider before starting an exercise program to make sure it is safe for you. Remember, almost any type of activity is better than none. Document Released: 06/22/2003 Document Revised: 08/16/2013 Document Reviewed: 09/08/2012 ExitCare Patient Information 2015 [employer]. This information is not intended to replace advice given to you by your health care provider. Make sure you discuss any questions you have with your health care provider. Basic Carbohydrate Counting for Diabetes Mellitus Carbohydrate counting is a method for keeping track of the amount of carbohydrates you eat.   Eating carbohydrates naturally increases the level of sugar (glucose) in your blood, so it is important for you to know the amount that is  okay for you to have in every meal. Carbohydrate counting helps keep the level of glucose in your blood within normal limits. The amount of carbohydrates allowed is different for every person. A dietitian can help you calculate the amount that is right for you. Once you know the amount of carbohydrates you can have, you can count the carbohydrates in the foods you want to eat. Carbohydrates are found in the following foods:  Grains, such as breads and cereals.  Dried beans and soy products.  Starchy vegetables, such as potatoes, peas, and corn.  Fruit and fruit juices.  Milk and yogurt.  Sweets and snack foods, such as cake, cookies, candy, chips, soft drinks, and fruit drinks. CARBOHYDRATE COUNTING There are two ways to count the carbohydrates in your food. You can use either of the methods or a combination of both. Reading the "Nutrition Facts" on Packaged Food The "Nutrition Facts" is an area that is included on the labels of almost all packaged food and beverages in the [place]. It includes the serving size of that food or beverage and information about the nutrients in each serving of the food, including the grams (g) of carbohydrate per serving.  Decide the number of servings of this food or beverage that you will be able to eat or drink. Multiply that number of servings by the number of grams of carbohydrate that is listed on the label for that serving. The total will be the amount of carbohydrates you will be having when you eat or drink this food or beverage. Learning Standard Serving Sizes of Food When you eat food that is not packaged or does not include "Nutrition Facts" on the label, you need to measure the servings in order to count the amount of carbohydrates.A serving of most carbohydrate-rich foods contains about 15 g of carbohydrates. The following list includes serving sizes of carbohydrate-rich foods that provide 15 g ofcarbohydrate per serving:   1 slice of bread  (1 oz) or 1 six-inch tortilla.    of a hamburger bun or English muffin.  4-6 crackers.   cup unsweetened dry cereal.    cup hot cereal.   cup rice or pasta.    cup mashed potatoes or  of a large baked potato.  1 cup fresh fruit or one small piece of fruit.    cup canned or frozen fruit or fruit juice.  1 cup milk.   cup plain fat-free yogurt or yogurt sweetened with artificial sweeteners.   cup cooked dried beans or starchy vegetable, such as peas, corn, or potatoes.  Decide the number of standard-size servings that you will eat. Multiply that number of servings by 15 (the grams of carbohydrates in that serving). For example, if you eat 2 cups of strawberries, you will have eaten 2 servings and 30 g of carbohydrates (2 servings x 15 g = 30 g). For foods such as soups and casseroles, in which more than one food is mixed in, you will need to count the carbohydrates in each food that is included. EXAMPLE OF CARBOHYDRATE COUNTING Sample Dinner  3 oz chicken breast.   cup of brown rice.   cup of corn.  1 cup milk.   1 cup strawberries with sugar-free whipped topping.  Carbohydrate Calculation Step 1: Identify the foods that contain carbohydrates:   Rice.   Corn.     Milk.   Strawberries. Step 2:Calculate the number of servings eaten of each:   2 servings of rice.   1 serving of corn.   1 serving of milk.   1 serving of strawberries. Step 3: Multiply each of those number of servings by 15 g:   2 servings of rice x 15 g = 30 g.   1 serving of corn x 15 g = 15 g.   1 serving of milk x 15 g = 15 g.   1 serving of strawberries x 15 g = 15 g. Step 4: Add together all of the amounts to find the total grams of carbohydrates eaten: 30 g + 15 g + 15 g + 15 g = 75 g. Document Released: 04/01/2005 Document Revised: 08/16/2013 Document Reviewed: 02/26/2013 ExitCare Patient Information 2015 [employer]. This information is not intended to  replace advice given to you by your health care provider. Make sure you discuss any questions you have with your health care provider.  

## 2014-11-10 NOTE — Progress Notes (Signed)
Patient here for annual check up. Patient reports pain in right side of lower back. Pain rated at a 7 described as aching. Pain is constant and started about 3 days ago. Patient CBG is 176 and A1C is 14.5. Patient has only taken 30 units of novolog today. Patient reports he has been out of all his medications except carvedilol and novolog for a while now. Patient needs refills on all except carvediolol.

## 2014-11-10 NOTE — Progress Notes (Signed)
Patient ID: Jason Stevenson, male   DOB: 1985/06/13, 29 y.o.   MRN: [id number]   Blayde Bacigalupi, is a 29 y.o. male  [id number]  [id number]  DOB - 11-14-1985  Chief Complaint  Patient presents with  . Annual Exam        Subjective:   Jason Stevenson is a 29 y.o. male here today for a follow up visit.he has history of type 2 diabetes mellitus without complication, hypertension, nonischemic heart disease and hyperlipidemia extremely noncompliant with medications and dietary advice and therefore follow-up. Patient always talk about having to work 2 jobs and not having enough time to take care of himself. Blood sugar is not controlled, patient out of all medications without asking for a refill, or to even call in. Patient has not had Follow-up since November 2015.h He has no complaints today. Patient has No headache, No chest pain, No abdominal pain - No Nausea, No new weakness tingling or numbness, No Cough - SOB.  No problems updated.  ALLERGIES: No Known Allergies  PAST MEDICAL HISTORY: Past Medical History  Diagnosis Date  . Diabetes mellitus without complication   . Hypertension   . Nonischemic cardiomyopathy Noted as early as 2007    Per chart review (cards consult note 2011), EF of 40% in 2007, down to 20-25% in 2011  . Hyperlipidemia     MEDICATIONS AT HOME: Prior to Admission medications   Medication Sig Start Date End Date Taking? Authorizing Provider  atorvastatin (LIPITOR) 40 MG tablet Take 1 tablet (40 mg total) by mouth daily at 6 PM. 11/10/14  Yes Madisyn Mawhinney E Hyman Hopes, MD  carvedilol (COREG) 6.25 MG tablet Take 1 tablet (6.25 mg total) by mouth 2 (two) times daily with a meal. 11/10/14  Yes Quentin Angst, MD  dapagliflozin propanediol (FARXIGA) 10 MG TABS tablet Take 10 mg by mouth daily. 11/10/14  Yes Khushboo Chuck Annitta Needs, MD  insulin aspart (NOVOLOG) 100 UNIT/ML injection Inject 10-50 Units into the skin 3 (three) times daily with meals. 11/10/14  Yes  Quentin Angst, MD  Insulin Syringes, Disposable, U-100 1 ML MISC Insulin syringes 30-gauge 11/20/12  Yes Ripudeep K Rai, MD  lisinopril (PRINIVIL,ZESTRIL) 2.5 MG tablet Take 1 tablet (2.5 mg total) by mouth daily. 11/10/14  Yes Quentin Angst, MD  metFORMIN (GLUCOPHAGE) 1000 MG tablet Take 1 tablet (1,000 mg total) by mouth 2 (two) times daily with a meal. 11/10/14  Yes Quentin Angst, MD  nitroGLYCERIN (NITROSTAT) 0.4 MG SL tablet Place 1 tablet (0.4 mg total) under the tongue every 5 (five) minutes as needed for chest pain. 12/16/12  Yes Beatrice Lecher, PA-C  aspirin 81 MG chewable tablet Chew 1 tablet (81 mg total) by mouth daily. Patient not taking: Reported on 11/10/2014 11/06/12   Narda Bonds, MD     Objective:   Filed Vitals:   11/10/14 1205  BP: 118/83  Pulse: 104  Temp: 97.9 F (36.6 C)  TempSrc: Oral  Resp: 18  Height: 6' (1.829 m)  Weight: 187 lb 6.4 oz (85.004 kg)  SpO2: 96%    Exam General appearance : Awake, alert, not in any distress. Speech Clear. Not toxic looking HEENT: Atraumatic and Normocephalic, pupils equally reactive to light and accomodation Neck: supple, no JVD. No cervical lymphadenopathy.  Chest:Good air entry bilaterally, no added sounds  CVS: S1 S2 regular, no murmurs.  Abdomen: Bowel sounds present, Non tender and not distended with no gaurding, rigidity or rebound. Extremities: B/L Lower Ext shows no  edema, both legs are warm to touch Neurology: Awake alert, and oriented X 3, CN II-XII intact, Non focal Skin:No Rash  Data Review Lab Results  Component Value Date   HGBA1C 14.50 11/10/2014   HGBA1C 9.2 12/23/2013   HGBA1C 9.3 09/20/2013     Assessment & Plan   1. Type 2 diabetes mellitus, uncontrolled  - Glucose (CBG) - HgB A1c has going up to 14.5% from 9.2% - COMPLETE METABOLIC PANEL WITH GFR - Lipid panel - Urinalysis, Complete - Microalbumin, urine  2. Type 2 diabetes mellitus with hyperglycemia  - dapagliflozin  propanediol (FARXIGA) 10 MG TABS tablet; Take 10 mg by mouth daily.  Dispense: 90 tablet; Refill: 3 - insulin aspart (NOVOLOG) 100 UNIT/ML injection; Inject 10-50 Units into the skin 3 (three) times daily with meals.  Dispense: 18 vial; Refill: 12 - metFORMIN (GLUCOPHAGE) 1000 MG tablet; Take 1 tablet (1,000 mg total) by mouth 2 (two) times daily with a meal.  Dispense: 180 tablet; Refill: 3  Patient has been extensively counseled about consequences of noncompliance with medications and complications of uncontrolled diabetes  Aim for 30 minutes of exercise most days. Rethink what you drink. Water is great! Aim for 2-3 Carb Choices per meal (30-45 grams) +/- 1 either way  Aim for 0-15 Carbs per snack if hungry  Include protein in moderation with your meals and snacks  Consider reading food labels for Total Carbohydrate and Fat Grams of foods  Consider checking BG at alternate times per day  Continue taking medication as directed Be mindful about how much sugar you are adding to beverages and other foods. Try to decrease. Consider splenda. Fruit Punch - find one with no sugar  Measure and decrease portions of carbohydrate foods  Make your plate and don't go back for seconds   3. HLD (hyperlipidemia)  - atorvastatin (LIPITOR) 40 MG tablet; Take 1 tablet (40 mg total) by mouth daily at 6 PM.  Dispense: 90 tablet; Refill: 3 To address this please limit saturated fat to no more than 7% of your calories, limit cholesterol to 200 mg/day, increase fiber and exercise as tolerated. If needed we may add another cholesterol lowering medication to your regimen.   4. Non-ischemic cardiomyopathy  - lisinopril (PRINIVIL,ZESTRIL) 2.5 MG tablet; Take 1 tablet (2.5 mg total) by mouth daily.  Dispense: 90 tablet; Refill: 3 - carvedilol (COREG) 6.25 MG tablet; Take 1 tablet (6.25 mg total) by mouth 2 (two) times daily with a meal.  Dispense: 180 tablet; Refill: 3   Patient have been counseled extensively  about nutrition and exercise  Patient is to bring blood pressure and blood sugar log for review in 4 weeks Return in about 4 weeks (around 12/08/2014) for CBG, Lab/Nurse Visit.  The patient was given clear instructions to go to ER or return to medical center if symptoms don't improve, worsen or new problems develop. The patient verbalized understanding. The patient was told to call to get lab results if they haven't heard anything in the next week.   This note has been created with Education officer, environmental. Any transcriptional errors are unintentional.    Jeanann Lewandowsky, MD, MHA, CPE, FACP, FAAP [facility] [place], [place] [phone]   11/10/2014, 12:39 PM

## 2014-11-11 LAB — URINALYSIS, COMPLETE
BACTERIA UA: NONE SEEN [HPF]
Bilirubin Urine: NEGATIVE
Casts: NONE SEEN [LPF]
Crystals: NONE SEEN [HPF]
HGB URINE DIPSTICK: NEGATIVE
Leukocytes, UA: NEGATIVE
Nitrite: NEGATIVE
PH: 5.5 (ref 5.0–8.0)
PROTEIN: NEGATIVE
SPECIFIC GRAVITY, URINE: 1.038 — AB (ref 1.001–1.035)
SQUAMOUS EPITHELIAL / LPF: NONE SEEN [HPF] (ref <=5)
WBC, UA: NONE SEEN WBC/HPF (ref <=5)
Yeast: NONE SEEN [HPF]

## 2014-11-11 LAB — MICROALBUMIN, URINE: MICROALB UR: 2.3 mg/dL — AB (ref <2.0)

## 2014-12-02 ENCOUNTER — Telehealth: Payer: Self-pay | Admitting: *Deleted

## 2014-12-02 NOTE — Telephone Encounter (Signed)
-----   Message from Tandy Gaw, RN sent at 11/25/2014  3:02 PM EDT -----   ----- Message -----    From: Quentin Angst, MD    Sent: 11/16/2014  12:00 PM      To: Tandy Gaw, RN  Please inform patient that his lab results shows very high cholesterol and high blood sugar. Advised patient to be compliant with cholesterol medication and blood sugar control as discussed during his visit. And also please limit saturated fat to no more than 7% of your calories, limit cholesterol to 200 mg/day, increase fiber and exercise as tolerated. If needed we may add another cholesterol lowering medication to your regimen.

## 2014-12-02 NOTE — Telephone Encounter (Signed)
Verified name and date of birth.   informed patient that his lab results shows very high cholesterol and high blood sugar. Advised patient to be compliant with cholesterol medication and blood sugar control as discussed during his visit. We discussed  limiting saturated fat to no more than 7% of your calories, limit cholesterol to 200 mg/day, increase fiber and exercise as tolerated. If needed we may add another cholesterol lowering medication to your regimen.  Patient verbalized understanding and agreed to make changes.

## 2014-12-13 ENCOUNTER — Ambulatory Visit: Payer: Self-pay | Admitting: Cardiovascular Disease

## 2015-02-18 NOTE — Progress Notes (Signed)
Patient ID: Jason Stevenson, male   DOB: 03-23-1986, 29 y.o.   MRN: [id number] Jason Stevenson is a 29 y.o. . male who returns for follow up  He has a hx of nonischemic cardiomyopathy, diabetes mellitus, HTN, HL. Cardiac MRI 04/2006: EF 45%, no hyper-enhancement. He has been lost to follow up.  Seen in the hospital after presenting with chest pain. Myocardial infarction was ruled out by enzymes. ECG demonstrated anterolateral T wave inversions. Beta blocker and ACE inhibitor therapy was restarted. Of note, the patient has a history of noncompliance with medications and follow up. Echocardiogram 11/06/12: EF 20-25%, diffuse HK, mild MR  Cardiac CTA 7/14: Calcium score 0, normal coronary arteries.   He has occasional chest pain. He thinks that this is related to meals. He denies significant dyspnea with exertion. He is NYHA class II. He denies orthopnea, PND or edema. He denies syncope.  Recent labs show BS poorly controlled Needs f/u with community health  Chol ok   Last echo 08/11/13 reviewed EF 35-40%    Only occasionally compliant with meds   ROS: Denies fever, malais, weight loss, blurry vision, decreased visual acuity, cough, sputum, SOB, hemoptysis, pleuritic pain, palpitaitons, heartburn, abdominal pain, melena, lower extremity edema, claudication, or rash.  All other systems reviewed and negative  General: Affect appropriate Healthy:  appears stated age HEENT: normal Neck supple with no adenopathy JVP normal no bruits no thyromegaly Lungs clear with no wheezing and good diaphragmatic motion Heart:  S1/S2 no murmur, no rub, gallop or click PMI normal Abdomen: benighn, BS positve, no tenderness, no AAA no bruit.  No HSM or HJR Distal pulses intact with no bruits No edema Neuro non-focal Skin warm and dry No muscular weakness   Current Outpatient Prescriptions  Medication Sig Dispense Refill  . aspirin 81 MG chewable tablet Chew 1 tablet (81 mg total) by mouth daily. (Patient  not taking: Reported on 11/10/2014) 30 tablet 30  . atorvastatin (LIPITOR) 40 MG tablet Take 1 tablet (40 mg total) by mouth daily at 6 PM. 90 tablet 3  . carvedilol (COREG) 6.25 MG tablet Take 1 tablet (6.25 mg total) by mouth 2 (two) times daily with a meal. 180 tablet 3  . dapagliflozin propanediol (FARXIGA) 10 MG TABS tablet Take 10 mg by mouth daily. 90 tablet 3  . insulin aspart (NOVOLOG) 100 UNIT/ML injection Inject 10-50 Units into the skin 3 (three) times daily with meals. 18 vial 12  . Insulin Syringes, Disposable, U-100 1 ML MISC Insulin syringes 30-gauge 100 each 12  . lisinopril (PRINIVIL,ZESTRIL) 2.5 MG tablet Take 1 tablet (2.5 mg total) by mouth daily. 90 tablet 3  . metFORMIN (GLUCOPHAGE) 1000 MG tablet Take 1 tablet (1,000 mg total) by mouth 2 (two) times daily with a meal. 180 tablet 3  . nitroGLYCERIN (NITROSTAT) 0.4 MG SL tablet Place 1 tablet (0.4 mg total) under the tongue every 5 (five) minutes as needed for chest pain. 25 tablet 2   No current facility-administered medications for this visit.    Allergies  Review of patient's allergies indicates no known allergies.  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  Continue current medications and low sodium Dash type diet.   DCM: discussed compliance issues f/u echo in 3 months  YS:HUOHFGBMS low carb diet.  Target hemoglobin A1c is 6.5 or less.  Continue current medications. Chol:  Lab Results  Component Value Date   LDLCALC 282* 11/10/2014   Discussed compliance with statin    Charlton Haws

## 2015-02-20 ENCOUNTER — Ambulatory Visit (INDEPENDENT_AMBULATORY_CARE_PROVIDER_SITE_OTHER): Payer: Self-pay | Admitting: Cardiovascular Disease

## 2015-02-20 ENCOUNTER — Encounter: Payer: Self-pay | Admitting: Cardiovascular Disease

## 2015-02-20 VITALS — BP 125/80 | HR 85 | Ht 72.0 in | Wt 191.0 lb

## 2015-02-20 DIAGNOSIS — I428 Other cardiomyopathies: Secondary | ICD-10-CM

## 2015-02-20 DIAGNOSIS — I429 Cardiomyopathy, unspecified: Secondary | ICD-10-CM

## 2015-02-20 DIAGNOSIS — R079 Chest pain, unspecified: Secondary | ICD-10-CM

## 2015-02-20 DIAGNOSIS — E785 Hyperlipidemia, unspecified: Secondary | ICD-10-CM

## 2015-02-20 MED ORDER — ATORVASTATIN CALCIUM 40 MG PO TABS: 40.0000 mg | ORAL_TABLET | Freq: Every day | ORAL | Status: DC

## 2015-02-20 MED ORDER — CARVEDILOL 6.25 MG PO TABS: 6.2500 mg | ORAL_TABLET | Freq: Two times a day (BID) | ORAL | Status: DC

## 2015-02-20 MED ORDER — NITROGLYCERIN 0.4 MG SL SUBL: 0.4000 mg | SUBLINGUAL_TABLET | SUBLINGUAL | Status: DC | PRN

## 2015-02-20 MED ORDER — LISINOPRIL 2.5 MG PO TABS: 2.5000 mg | ORAL_TABLET | Freq: Every day | ORAL | Status: DC

## 2015-02-20 NOTE — Patient Instructions (Addendum)
Medication Instructions:  Your physician recommends that you continue on your current medications as directed. Please refer to the Current Medication list given to you today.   Labwork: NONE  Testing/Procedures: Your physician has requested that you have an echocardiogram. Echocardiography is a painless test that uses sound waves to create images of your heart. It provides your doctor with information about the size and shape of your heart and how well your heart's chambers and valves are working. This procedure takes approximately one hour. There are no restrictions for this procedure. IN   3 MONTHS   Follow-Up: Your physician recommends that you schedule a follow-up appointment in: 3 MONTHS  WITH  DR Eden Emms  ECHO  SAME DAY  Any Other Special Instructions Will Be Listed Below (If Applicable).     If you need a refill on your cardiac medications before your next appointment, please call your pharmacy.

## 2015-05-02 MED FILL — LISINOPRIL 2.5 MG TABLET: 2.5 | 30 days supply | Qty: 30 | Fill #1

## 2015-05-02 MED FILL — CARVEDILOL 6.25 MG TABLET: 6.25 | 30 days supply | Qty: 60 | Fill #1

## 2015-05-02 MED FILL — ?METFORMIN HCL 1,000 MG TAB: 1000 | 30 days supply | Qty: 60 | Fill #2

## 2015-05-02 MED FILL — !NOVOLOG 100UNITS/ML VIAL: 100/ML | 6 days supply | Qty: 10 | Fill #2

## 2015-05-02 MED FILL — ?ATORVASTATIN 40MG TABLET: 40 | 30 days supply | Qty: 30 | Fill #1

## 2015-05-24 NOTE — Progress Notes (Signed)
Patient ID: Jason Stevenson, male   DOB: 1986-03-08, 30 y.o.   MRN: [id number]   Jason Stevenson is a 30 y.o. . male who returns for follow up  He has a hx of nonischemic cardiomyopathy, diabetes mellitus, HTN, HL. Cardiac MRI 04/2006: EF 45%, no hyper-enhancement. He has been lost to follow up.  Seen in the hospital after presenting with chest pain. Myocardial infarction was ruled out by enzymes. ECG demonstrated anterolateral T wave inversions. Beta blocker and ACE inhibitor therapy was restarted. Of note, the patient has a history of noncompliance with medications and follow up. Echocardiogram 11/06/12: EF 20-25%, diffuse HK, mild MR  Cardiac CTA 7/14: Calcium score 0, normal coronary arteries.   He has occasional chest pain. He thinks that this is related to meals. He denies significant dyspnea with exertion. He is NYHA class II. He denies orthopnea, PND or edema. He denies syncope.  Recent labs show BS poorly controlled Needs f/u with community health  Chol ok   Reviewed echo from today 05/25/15  EF 20-25% diffuse hypokinesis RV normal no valve disease  Only occasionally compliant with meds   ROS: Denies fever, malais, weight loss, blurry vision, decreased visual acuity, cough, sputum, SOB, hemoptysis, pleuritic pain, palpitaitons, heartburn, abdominal pain, melena, lower extremity edema, claudication, or rash.  All other systems reviewed and negative  General: Affect appropriate Healthy:  appears stated age HEENT: normal Neck supple with no adenopathy JVP normal no bruits no thyromegaly Lungs clear with no wheezing and good diaphragmatic motion Heart:  S1/S2 no murmur, no rub, gallop or click PMI normal Abdomen: benighn, BS positve, no tenderness, no AAA no bruit.  No HSM or HJR Distal pulses intact with no bruits No edema Neuro non-focal Skin warm and dry No muscular weakness   Current Outpatient Prescriptions  Medication Sig Dispense Refill  . atorvastatin (LIPITOR) 40 MG  tablet Take 1 tablet (40 mg total) by mouth daily at 6 PM. 90 tablet 3  . carvedilol (COREG) 6.25 MG tablet Take 1 tablet (6.25 mg total) by mouth 2 (two) times daily with a meal. 180 tablet 3  . dapagliflozin propanediol (FARXIGA) 10 MG TABS tablet Take 10 mg by mouth daily. 90 tablet 3  . insulin aspart (NOVOLOG) 100 UNIT/ML injection Inject 10-50 Units into the skin 3 (three) times daily with meals. 18 vial 12  . Insulin Syringes, Disposable, U-100 1 ML MISC Insulin syringes 30-gauge 100 each 12  . lisinopril (PRINIVIL,ZESTRIL) 2.5 MG tablet Take 1 tablet (2.5 mg total) by mouth daily. 90 tablet 3  . metFORMIN (GLUCOPHAGE) 1000 MG tablet Take 1 tablet (1,000 mg total) by mouth 2 (two) times daily with a meal. 180 tablet 3  . nitroGLYCERIN (NITROSTAT) 0.4 MG SL tablet Place 1 tablet (0.4 mg total) under the tongue every 5 (five) minutes as needed for chest pain. 25 tablet 2   No current facility-administered medications for this visit.    Allergies  Review of patient's allergies indicates no known allergies.  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  Continue current medications and low sodium Dash type diet.   DCM: discussed compliance issues check BNP and BMET refer to CHF clinic  WU:[id number] low carb diet.  Target hemoglobin A1c is 6.5 or less.  Continue current medications. Chol:  Lab Results  Component Value Date   LDLCALC 282* 11/10/2014  Discussed compliance with statin   F/U with me in 6 months   Charlton Haws

## 2015-05-25 ENCOUNTER — Ambulatory Visit (HOSPITAL_COMMUNITY): Payer: Self-pay | Attending: Cardiology

## 2015-05-25 ENCOUNTER — Encounter: Payer: Self-pay | Admitting: Cardiovascular Disease

## 2015-05-25 ENCOUNTER — Ambulatory Visit (INDEPENDENT_AMBULATORY_CARE_PROVIDER_SITE_OTHER): Payer: Self-pay | Admitting: Cardiovascular Disease

## 2015-05-25 ENCOUNTER — Other Ambulatory Visit: Payer: Self-pay

## 2015-05-25 VITALS — BP 110/78 | HR 76 | Ht 72.0 in | Wt 192.0 lb

## 2015-05-25 DIAGNOSIS — I429 Cardiomyopathy, unspecified: Secondary | ICD-10-CM

## 2015-05-25 DIAGNOSIS — E785 Hyperlipidemia, unspecified: Secondary | ICD-10-CM | POA: Insufficient documentation

## 2015-05-25 DIAGNOSIS — E119 Type 2 diabetes mellitus without complications: Secondary | ICD-10-CM | POA: Insufficient documentation

## 2015-05-25 DIAGNOSIS — I428 Other cardiomyopathies: Secondary | ICD-10-CM

## 2015-05-25 DIAGNOSIS — I10 Essential (primary) hypertension: Secondary | ICD-10-CM

## 2015-05-25 DIAGNOSIS — R0602 Shortness of breath: Secondary | ICD-10-CM

## 2015-05-25 DIAGNOSIS — I517 Cardiomegaly: Secondary | ICD-10-CM | POA: Insufficient documentation

## 2015-05-25 LAB — BASIC METABOLIC PANEL
BUN: 10 mg/dL (ref 7–25)
CHLORIDE: 101 mmol/L (ref 98–110)
CO2: 24 mmol/L (ref 20–31)
CREATININE: 0.78 mg/dL (ref 0.60–1.35)
Calcium: 9.1 mg/dL (ref 8.6–10.3)
Glucose, Bld: 284 mg/dL — ABNORMAL HIGH (ref 65–99)
Potassium: 4 mmol/L (ref 3.5–5.3)
Sodium: 135 mmol/L (ref 135–146)

## 2015-05-25 LAB — BRAIN NATRIURETIC PEPTIDE: Brain Natriuretic Peptide: 62.8 pg/mL (ref <100)

## 2015-05-25 NOTE — Patient Instructions (Signed)
Medication Instructions:  Your physician recommends that you continue on your current medications as directed. Please refer to the Current Medication list given to you today.  Labwork: Your physician recommends that you have lab work: BMET and BNP   Testing/Procedures: NONE  Follow-Up: Your physician recommends that you schedule a follow-up appointment with [facility]  Your physician wants you to follow-up in: 6 months with Dr. Eden Emms. You will receive a reminder letter in the mail two months in advance. If you don't receive a letter, please call our office to schedule the follow-up appointment.  If you need a refill on your cardiac medications before your next appointment, please call your pharmacy.

## 2015-06-13 MED FILL — CARVEDILOL 6.25 MG TABLET: 6.25 | 30 days supply | Qty: 60 | Fill #2

## 2015-06-13 MED FILL — LISINOPRIL 2.5 MG TABLET: 2.5 | 30 days supply | Qty: 30 | Fill #2

## 2015-06-13 MED FILL — ?ATORVASTATIN 40MG TABLET: 40 | 30 days supply | Qty: 30 | Fill #2

## 2015-06-13 MED FILL — !NOVOLOG 100UNITS/ML VIAL: 100/ML | 6 days supply | Qty: 10 | Fill #3

## 2015-06-16 ENCOUNTER — Ambulatory Visit (HOSPITAL_COMMUNITY)
Admission: RE | Admit: 2015-06-16 | Discharge: 2015-06-16 | Disposition: A | Discharge status: Home / Self Care | Payer: Self-pay | Source: Ambulatory Visit | Attending: Cardiology | Admitting: Cardiology

## 2015-06-16 ENCOUNTER — Encounter (HOSPITAL_COMMUNITY): Payer: Self-pay

## 2015-06-16 VITALS — BP 118/70 | HR 73 | Wt 193.0 lb

## 2015-06-16 DIAGNOSIS — I429 Cardiomyopathy, unspecified: Secondary | ICD-10-CM

## 2015-06-16 DIAGNOSIS — I110 Hypertensive heart disease with heart failure: Secondary | ICD-10-CM | POA: Insufficient documentation

## 2015-06-16 DIAGNOSIS — I5022 Chronic systolic (congestive) heart failure: Secondary | ICD-10-CM | POA: Insufficient documentation

## 2015-06-16 DIAGNOSIS — Z833 Family history of diabetes mellitus: Secondary | ICD-10-CM | POA: Insufficient documentation

## 2015-06-16 DIAGNOSIS — E785 Hyperlipidemia, unspecified: Secondary | ICD-10-CM | POA: Insufficient documentation

## 2015-06-16 DIAGNOSIS — I428 Other cardiomyopathies: Secondary | ICD-10-CM | POA: Insufficient documentation

## 2015-06-16 DIAGNOSIS — Z794 Long term (current) use of insulin: Secondary | ICD-10-CM | POA: Insufficient documentation

## 2015-06-16 DIAGNOSIS — E119 Type 2 diabetes mellitus without complications: Secondary | ICD-10-CM | POA: Insufficient documentation

## 2015-06-16 DIAGNOSIS — Z79899 Other long term (current) drug therapy: Secondary | ICD-10-CM | POA: Insufficient documentation

## 2015-06-16 HISTORY — DX: Chronic systolic (congestive) heart failure: I50.22

## 2015-06-16 MED ORDER — SACUBITRIL-VALSARTAN 24-26 MG PO TABS: 1.0000 | ORAL_TABLET | Freq: Two times a day (BID) | ORAL | Status: DC

## 2015-06-16 NOTE — Patient Instructions (Signed)
STOP Lisinopril.  START Entresto 24/26 mg tablet twice daily on Sunday.  Return in 2 weeks for lab work.  Follow up 1 month.  Do the following things EVERYDAY: 1) Weigh yourself in the morning before breakfast. Write it down and keep it in a log. 2) Take your medicines as prescribed 3) Eat low salt foods-Limit salt (sodium) to 2000 mg per day.  4) Stay as active as you can everyday 5) Limit all fluids for the day to less than 2 liters

## 2015-06-18 ENCOUNTER — Encounter (HOSPITAL_COMMUNITY): Payer: Self-pay

## 2015-06-18 DIAGNOSIS — I5022 Chronic systolic (congestive) heart failure: Secondary | ICD-10-CM

## 2015-06-18 HISTORY — DX: Chronic systolic (congestive) heart failure: I50.22

## 2015-06-18 NOTE — Progress Notes (Signed)
Patient ID: Jason Stevenson, male   DOB: [birth year], 30 y.o.   MRN: [id number] PCP: Dr Hyman Hopes Cardiology: Dr Eden Emms HF Cardiology: Dr Shirlee Latch  30 yo with diabetes, HTN, and a long history of nonischemic cardiomyopathy presents for CHF clinic evaluation. I am seeing him for the first time today and have reviewed all his old records.  He had a cardiac MRI in 1/08 showing low EF, but he says that he had been told about "heart problems" even prior to that.  He does not have a family history of cardiomyopathy that he knows of, but does not know his father's family.  He has generally had sporadic followup over the last few years, but has been seeing Dr Eden Emms recently.  Coronary CT angiogram in 2014 showed no CAD. Last echo in 2/17 showed EF 20-25%, similar to 2014.    Generally, he has been feeling good.  Holds job at [employer], able to [employer] without problems at work. No dyspnea walking, notes mild dyspnea with running.  He has a long history of non-exertional chest tightness.  Sometimes seems to be related to stress.   ECG (11/16): NSR, nonspecific T wave inversions, narrow QRS.  Labs (7/16): LDL 282, TGs 273 Labs (2/17): K 4, creatinine 0.78, BNP 63  PMH: 1. Type II diabetes 2. HTN 3. Hyperlipidemia 4. Cardiomyopathy: Suspect nonischemic.  - Cardiac MRI (1/08): EF 45%, no late gadolinium enhancement.  - Echo (7/14) with EF 20-25%, diffuse hypokinesis.  - Coronary CT angiogram (7/14) with coronary calcium score of 0 and normal coronaries.  - Echo (2/17) with EF 20-25%, diffuse hypokinesis, normal RV size and systolic function.   FH: Diabetes in mother's family, does not know history on father's side.   SH: Nonsmoker, works at [employer], rare ETOH, no drugs.    ROS: All systems reviewed and negative except as per HPI.   Current Outpatient Prescriptions  Medication Sig Dispense Refill  . atorvastatin (LIPITOR) 40 MG tablet Take 1 tablet (40 mg total) by mouth daily at 6 PM. 90 tablet 3  .  carvedilol (COREG) 6.25 MG tablet Take 1 tablet (6.25 mg total) by mouth 2 (two) times daily with a meal. 180 tablet 3  . insulin aspart (NOVOLOG) 100 UNIT/ML injection Inject 10-50 Units into the skin 3 (three) times daily with meals. 18 vial 12  . Insulin Syringes, Disposable, U-100 1 ML MISC Insulin syringes 30-gauge 100 each 12  . metFORMIN (GLUCOPHAGE) 1000 MG tablet Take 1 tablet (1,000 mg total) by mouth 2 (two) times daily with a meal. 180 tablet 3  . nitroGLYCERIN (NITROSTAT) 0.4 MG SL tablet Place 1 tablet (0.4 mg total) under the tongue every 5 (five) minutes as needed for chest pain. (Patient not taking: Reported on 06/16/2015) 25 tablet 2  . sacubitril-valsartan (ENTRESTO) 24-26 MG Take 1 tablet by mouth 2 (two) times daily. 60 tablet 11   No current facility-administered medications for this encounter.   BP 118/70 mmHg  Pulse 73  Wt 193 lb (87.544 kg)  SpO2 99% General: NAD Neck: No JVD, no thyromegaly or thyroid nodule.  Lungs: Clear to auscultation bilaterally with normal respiratory effort. CV: Nondisplaced PMI.  Heart regular S1/S2, no S3/S4, no murmur.  No peripheral edema.  No carotid bruit.  Normal pedal pulses.  Abdomen: Soft, nontender, no hepatosplenomegaly, no distention.  Skin: Intact without lesions or rashes.  Neurologic: Alert and oriented x 3.  Psych: Normal affect. Extremities: No clubbing or cyanosis.  HEENT: Normal.   Assessment/Plan: 1.  Chronic systolic CHF: Nonischemic cardiomyopathy. Last echo with EF 20-25% (2/17).  This is similar to prior echo in 2014.  Coronary CTA in 2014 showed no CAD.  Cardiac MRI in 2008 showed no late gadolinium enhancement.  He denies drug or ETOH abuse.  No known family history of cardiomyopathy.  ?Viral myocarditis as etiology of cardiomyopathy.  Symptoms NYHA class I-II.  He is not volume overloaded on exam.  Would like to try to enhance his medical regimen.  - Continue Creog 6.25 mg bid.  - Stop lisinopril, start Entresto  24/26 bid in 36 hours.  We will work on getting him financial assistance through the company.  - BMET in 2 wks.  - Followup in 1 month for med titration.  Will need to repeat echo in a month or so.  If EF remains low, would recommend ICD.  He is not a candidate for CRT with narrow QRS.  2. Hyperlipidemia: Markedly elevated LDL in 7/16.  He is now on atorvastatin.  Will check lipids/LFTs today.  3. Diabetes: Per PCP.   Marca Ancona 06/18/2015

## 2015-06-23 ENCOUNTER — Telehealth (HOSPITAL_COMMUNITY): Payer: Self-pay | Admitting: Pharmacist

## 2015-06-23 NOTE — Telephone Encounter (Signed)
[employer] patient assistance for [employer] approved through 06/20/2016.   Tyler Deis. Bonnye Fava, PharmD, BCPS, CPP Clinical Pharmacist Pager: [phone] Phone: [phone] 06/23/2015 12:47 PM

## 2015-06-30 ENCOUNTER — Other Ambulatory Visit (HOSPITAL_COMMUNITY): Payer: Self-pay

## 2015-07-03 ENCOUNTER — Ambulatory Visit (HOSPITAL_COMMUNITY)
Admission: RE | Admit: 2015-07-03 | Discharge: 2015-07-03 | Disposition: A | Discharge status: Home / Self Care | Payer: Self-pay | Source: Ambulatory Visit | Attending: Cardiology | Admitting: Cardiology

## 2015-07-03 DIAGNOSIS — I428 Other cardiomyopathies: Secondary | ICD-10-CM | POA: Insufficient documentation

## 2015-07-03 DIAGNOSIS — I429 Cardiomyopathy, unspecified: Secondary | ICD-10-CM

## 2015-07-03 LAB — BASIC METABOLIC PANEL
ANION GAP: 14 (ref 5–15)
BUN: 7 mg/dL (ref 6–20)
CHLORIDE: 99 mmol/L — AB (ref 101–111)
CO2: 24 mmol/L (ref 22–32)
Calcium: 9.1 mg/dL (ref 8.9–10.3)
Creatinine, Ser: 0.89 mg/dL (ref 0.61–1.24)
GFR calc non Af Amer: >60 mL/min (ref >60)
GLUCOSE: 329 mg/dL — AB (ref 65–99)
POTASSIUM: 4.1 mmol/L (ref 3.5–5.1)
Sodium: 137 mmol/L (ref 135–145)

## 2015-07-03 LAB — LIPID PANEL
CHOL/HDL RATIO: 5 ratio
Cholesterol: 210 mg/dL — ABNORMAL HIGH (ref 0–200)
HDL: 42 mg/dL (ref >40)
LDL Cholesterol: 119 mg/dL — ABNORMAL HIGH (ref 0–99)
Triglycerides: 244 mg/dL — ABNORMAL HIGH (ref <150)
VLDL: 49 mg/dL — ABNORMAL HIGH (ref 0–40)

## 2015-07-03 MED FILL — ?ENTRESTO 24 MG-26 MG TABLE: 24 MG-26 MG | 26 days supply | Qty: 52 | Fill #0

## 2015-07-06 ENCOUNTER — Telehealth (HOSPITAL_COMMUNITY): Payer: Self-pay | Admitting: Cardiology

## 2015-07-06 MED ORDER — FISH OIL 1000 MG PO CPDR: 2000.0000 g | DELAYED_RELEASE_CAPSULE | Freq: Every day | ORAL | Status: DC

## 2015-07-06 NOTE — Telephone Encounter (Signed)
-----   Message from Laurey Morale, MD sent at 07/06/2015 11:21 AM EDT ----- LDL ok given no known CAD.  Triglycerides high, suggest fish oil 2 g daily.

## 2015-07-06 NOTE — Telephone Encounter (Signed)
Pt aware and voiced understanding Patient reports he will get med OTC

## 2015-07-11 MED FILL — CARVEDILOL 6.25 MG TABLET: 6.25 | 30 days supply | Qty: 60 | Fill #3

## 2015-07-11 MED FILL — ?ATORVASTATIN 40MG TABLET: 40 | 30 days supply | Qty: 30 | Fill #3

## 2015-07-11 MED FILL — metFORMIN HCL 1000 MG TABS: 1000 | 30 days supply | Qty: 60 | Fill #3

## 2015-07-21 ENCOUNTER — Encounter (HOSPITAL_COMMUNITY): Payer: Self-pay

## 2015-07-31 ENCOUNTER — Encounter (HOSPITAL_COMMUNITY): Payer: Self-pay

## 2015-08-07 ENCOUNTER — Encounter (HOSPITAL_COMMUNITY): Payer: Self-pay

## 2015-08-07 ENCOUNTER — Ambulatory Visit (HOSPITAL_COMMUNITY)
Admission: RE | Admit: 2015-08-07 | Discharge: 2015-08-07 | Disposition: A | Discharge status: Home / Self Care | Payer: Self-pay | Source: Ambulatory Visit | Attending: Cardiology | Admitting: Cardiology

## 2015-08-07 VITALS — BP 130/82 | HR 83 | Wt 196.8 lb

## 2015-08-07 DIAGNOSIS — I110 Hypertensive heart disease with heart failure: Secondary | ICD-10-CM | POA: Insufficient documentation

## 2015-08-07 DIAGNOSIS — I429 Cardiomyopathy, unspecified: Secondary | ICD-10-CM

## 2015-08-07 DIAGNOSIS — I428 Other cardiomyopathies: Secondary | ICD-10-CM | POA: Insufficient documentation

## 2015-08-07 DIAGNOSIS — Z79899 Other long term (current) drug therapy: Secondary | ICD-10-CM | POA: Insufficient documentation

## 2015-08-07 DIAGNOSIS — Z794 Long term (current) use of insulin: Secondary | ICD-10-CM | POA: Insufficient documentation

## 2015-08-07 DIAGNOSIS — Z833 Family history of diabetes mellitus: Secondary | ICD-10-CM | POA: Insufficient documentation

## 2015-08-07 DIAGNOSIS — E785 Hyperlipidemia, unspecified: Secondary | ICD-10-CM | POA: Insufficient documentation

## 2015-08-07 DIAGNOSIS — I5022 Chronic systolic (congestive) heart failure: Secondary | ICD-10-CM | POA: Insufficient documentation

## 2015-08-07 DIAGNOSIS — IMO0001 Reserved for inherently not codable concepts without codable children: Secondary | ICD-10-CM | POA: Insufficient documentation

## 2015-08-07 DIAGNOSIS — E1165 Type 2 diabetes mellitus with hyperglycemia: Secondary | ICD-10-CM

## 2015-08-07 DIAGNOSIS — E119 Type 2 diabetes mellitus without complications: Secondary | ICD-10-CM | POA: Insufficient documentation

## 2015-08-07 MED ORDER — CARVEDILOL 6.25 MG PO TABS: 9.3750 mg | ORAL_TABLET | Freq: Two times a day (BID) | ORAL | Status: DC

## 2015-08-07 NOTE — Progress Notes (Signed)
Advanced Heart Failure Medication Review by a Pharmacist  Does the patient  feel that his/her medications are working for him/her?  yes  Has the patient been experiencing any side effects to the medications prescribed?  yes  Does the patient measure his/her own blood pressure or blood glucose at home?  yes   Does the patient have any problems obtaining medications due to transportation or finances?   no  Understanding of regimen: good Understanding of indications: good Potential of compliance: good Patient understands to avoid NSAIDs. Patient understands to avoid decongestants.   Pharmacist comments: Denies taking morning doses prior to visit today. Complains of bloating and stomach tightness. Has recently decreased his metformin dose to 500mg  BID due to diarrhea. Recommended pt reduce his metformin dose further to 500mg  daily for a week to see if stomach issues subside. Encouraged to follow up with PCP. If trial not helpful. No specific medication questions otherwise.     Time with patient: Preparation and documentation time: Total time:  Sherle Poe, PharmD Clinical Pharmacy Resident 10:42 AM, 08/07/2015

## 2015-08-07 NOTE — Progress Notes (Signed)
Patient ID: Zayshawn Rankin, male   DOB: [birth year], 30 y.o.   MRN: [id number]  PCP: Dr Hyman Hopes Cardiology: Dr Eden Emms HF Cardiology: Dr Shirlee Latch  30 yo with diabetes, HTN, and a long history of nonischemic cardiomyopathy presents for CHF clinic evaluation.  He had a cardiac MRI in 1/08 showing low EF, but he says that he had been told about "heart problems" even prior to that.  He does not have a family history of cardiomyopathy that he knows of, but does not know his father's family.  He has generally had sporadic followup over the last few years, but has been seeing Dr Eden Emms recently.  Coronary CT angiogram in 2014 showed no CAD. Last echo in 2/17 showed EF 20-25%, similar to 2014.    He returns for HF follow up. Last visit lisinopril stopped and entresto started. Denies SOB/PND/Orthopnea.   Does not drink alcohol. He has not had any medications today. Says he rarely misses medications. Continues to work full time at [employer]. Says he lifts 40-70 pounds through out the day.   ECG (11/16): NSR, nonspecific T wave inversions, narrow QRS.  Labs (7/16): LDL 282, TGs 273 Labs (2/17): K 4, creatinine 0.78, BNP 63 Labs (07/03/2015): K 4.1 Creatinine 0.89   PMH: 1. Type II diabetes 2. HTN 3. Hyperlipidemia 4. Cardiomyopathy: Suspect nonischemic.  - Cardiac MRI (1/08): EF 45%, no late gadolinium enhancement.  - Echo (7/14) with EF 20-25%, diffuse hypokinesis.  - Coronary CT angiogram (7/14) with coronary calcium score of 0 and normal coronaries.  - Echo (2/17) with EF 20-25%, diffuse hypokinesis, normal RV size and systolic function.   FH: Diabetes in mother's family, does not know history on father's side.   SH: Nonsmoker, works at [employer], rare ETOH, no drugs.    ROS: All systems reviewed and negative except as per HPI.   Current Outpatient Prescriptions  Medication Sig Dispense Refill  . atorvastatin (LIPITOR) 40 MG tablet Take 1 tablet (40 mg total) by mouth daily at 6 PM. 90 tablet 3  .  carvedilol (COREG) 6.25 MG tablet Take 1 tablet (6.25 mg total) by mouth 2 (two) times daily with a meal. 180 tablet 3  . insulin aspart (NOVOLOG) 100 UNIT/ML injection Inject 10-50 Units into the skin 3 (three) times daily with meals. 18 vial 12  . Insulin Syringes, Disposable, U-100 1 ML MISC Insulin syringes 30-gauge 100 each 12  . metFORMIN (GLUCOPHAGE) 1000 MG tablet Take 1 tablet (1,000 mg total) by mouth 2 (two) times daily with a meal. (Patient taking differently: Take 1,000 mg by mouth 2 (two) times daily with a meal. Taking 1/2 tablet BIDAC) 180 tablet 3  . Omega-3 Fatty Acids (FISH OIL) 1000 MG CPDR Take 2,000 g by mouth daily.    . sacubitril-valsartan (ENTRESTO) 24-26 MG Take 1 tablet by mouth 2 (two) times daily. 60 tablet 11  . nitroGLYCERIN (NITROSTAT) 0.4 MG SL tablet Place 1 tablet (0.4 mg total) under the tongue every 5 (five) minutes as needed for chest pain. (Patient not taking: Reported on 06/16/2015) 25 tablet 2   No current facility-administered medications for this encounter.   BP 130/82 mmHg  Pulse 83  Wt 196 lb 12 oz (89.245 kg)  SpO2 98% General: NAD. Ambulated in the clinic without difficulty.  Neck: No JVD, no thyromegaly or thyroid nodule.  Lungs: Clear to auscultation bilaterally with normal respiratory effort. CV: Nondisplaced PMI.  Heart regular S1/S2, no S3/S4, no murmur.  No peripheral edema.  No carotid  bruit.  Normal pedal pulses.  Abdomen: Soft, nontender, no hepatosplenomegaly, no distention.  Skin: Intact without lesions or rashes.  Neurologic: Alert and oriented x 3.  Psych: Normal affect. Extremities: No clubbing or cyanosis.  HEENT: Normal.   Assessment/Plan: 1. Chronic systolic CHF: Nonischemic cardiomyopathy. Last echo with EF 20-25% (2/17).  This is similar to prior echo in 2014.  Coronary CTA in 2014 showed no CAD.  Cardiac MRI in 2008 showed no late gadolinium enhancement.  He denies drug or ETOH abuse.  No known family history of  cardiomyopathy.  ?Viral myocarditis as etiology of cardiomyopathy.  Symptoms NYHA class I-II.   -Volume status stable. Not on any diuretics.   - Increase coreg 9.375 mg bid.  - Continue Entresto 24/26 bid. Consider increasing at next visit.  -Hold off on spiro for now.   If EF remains low, would recommend ICD.  He is not a candidate for CRT with narrow QRS.  2. Hyperlipidemia: Markedly elevated LDL in 7/16.  He is now on atorvastatin + fish oil.  3. Diabetes: Per PCP.   Follow up with pharmacy in 3 weeks with pharmacy for med titration then 6 weeks with an ECHO and Dr Shirlee Latch.    Tanvi Gatling NP-C  08/07/2015

## 2015-08-07 NOTE — Addendum Note (Signed)
Encounter addended by: Reuel Derby, RPH on: 08/07/2015 10:43 AM<BR>     Documentation filed: Notes Section

## 2015-08-07 NOTE — Patient Instructions (Addendum)
INCREASE Carvedilol (Coreg) to 9.375 mg (1.5 tabs) twice daily.  Follow up 3 weeks with CHF Pharmacist Erika.  Will schedule you for an echocardiogram at [facility]. Address: [address]), [place], [place] [address]  Phone: [phone]  Follow up 6 weeks with Dr. Shirlee Latch  Do the following things EVERYDAY: 1) Weigh yourself in the morning before breakfast. Write it down and keep it in a log. 2) Take your medicines as prescribed 3) Eat low salt foods-Limit salt (sodium) to 2000 mg per day.  4) Stay as active as you can everyday 5) Limit all fluids for the day to less than 2 liters

## 2015-08-21 ENCOUNTER — Other Ambulatory Visit: Payer: Self-pay

## 2015-08-21 ENCOUNTER — Ambulatory Visit (HOSPITAL_COMMUNITY)
Admission: RE | Admit: 2015-08-21 | Discharge: 2015-08-21 | Disposition: A | Discharge status: Home / Self Care | Payer: Self-pay | Source: Ambulatory Visit | Attending: Internal Medicine | Admitting: Internal Medicine

## 2015-08-21 ENCOUNTER — Ambulatory Visit (HOSPITAL_BASED_OUTPATIENT_CLINIC_OR_DEPARTMENT_OTHER): Payer: Self-pay

## 2015-08-21 DIAGNOSIS — I110 Hypertensive heart disease with heart failure: Secondary | ICD-10-CM | POA: Insufficient documentation

## 2015-08-21 DIAGNOSIS — Z79899 Other long term (current) drug therapy: Secondary | ICD-10-CM | POA: Insufficient documentation

## 2015-08-21 DIAGNOSIS — I428 Other cardiomyopathies: Secondary | ICD-10-CM | POA: Insufficient documentation

## 2015-08-21 DIAGNOSIS — I5022 Chronic systolic (congestive) heart failure: Secondary | ICD-10-CM | POA: Insufficient documentation

## 2015-08-21 DIAGNOSIS — E785 Hyperlipidemia, unspecified: Secondary | ICD-10-CM | POA: Insufficient documentation

## 2015-08-21 DIAGNOSIS — E119 Type 2 diabetes mellitus without complications: Secondary | ICD-10-CM | POA: Insufficient documentation

## 2015-08-21 LAB — BASIC METABOLIC PANEL
Anion gap: 9 (ref 5–15)
BUN: 8 mg/dL (ref 6–20)
CO2: 26 mmol/L (ref 22–32)
Calcium: 8.8 mg/dL — ABNORMAL LOW (ref 8.9–10.3)
Chloride: 104 mmol/L (ref 101–111)
Creatinine, Ser: 0.71 mg/dL (ref 0.61–1.24)
GFR calc Af Amer: >60 mL/min (ref >60)
GLUCOSE: 287 mg/dL — AB (ref 65–99)
POTASSIUM: 3.7 mmol/L (ref 3.5–5.1)
Sodium: 139 mmol/L (ref 135–145)

## 2015-08-21 MED ORDER — SACUBITRIL-VALSARTAN 49-51 MG PO TABS: 1.0000 | ORAL_TABLET | Freq: Two times a day (BID) | ORAL | Status: DC

## 2015-08-21 NOTE — Patient Instructions (Addendum)
It was great to see you today!   Increase Entresto to 49/51 mg by mouth twice daily.   Look into the mediterranean diet for assistance with low carb and low sodium options.   Labs today.  Please keep your follow up appointment with Dr. Shirlee Latch on 6/6.

## 2015-08-21 NOTE — Progress Notes (Signed)
HPI:  Jason Stevenson is a 30 yo AA male with diabetes, HTN, and a long history of nonischemic cardiomyopathy presents for CHF clinic evaluation. He had a cardiac MRI in 1/08 showing low EF, but he says that he had been told about "heart problems" even prior to that. He does not have a family history of cardiomyopathy that he knows of, but does not know his father's family. He has generally had sporadic followup over the last few years, but has been seeing Dr Eden Emms recently.Coronary CT angiogram in 2014 showed no CAD. Last echo in 2/17 showed EF 20-25%, similar to 2014.   He returns today for pharmacist-led HF medication titration. At his last HF clinic visit on 4/24, his carvedilol was increased from 6.25 mg BID to 9.375 mg BID. Denies SOB/PND/Orthopnea.Does not drink alcohol. Says he rarely misses medications. Continues to work full time at [employer]. Says he lifts 40-70 pounds through out the day.     . Shortness of breath/dyspnea on exertion? no  . Orthopnea/PND? no . Edema? no . Lightheadedness/dizziness? no . Daily weights at home? no . Blood pressure/heart rate monitoring at home? No - monitors BG  . Following low-sodium/fluid-restricted diet? Yes - attempts to stay below 2L and mainly drinking diet soda and water; admits to not following a low sodium diet, eating fast food but discussed limiting sodium to <2g/day - recommended mediterranean diet   HF Medications: Carvedilol 9.375 mg PO BID Entresto 24-26 mg PO BID   Has the patient been experiencing any side effects to the medications prescribed?  no  Does the patient have any problems obtaining medications due to transportation or finances?  Yes - no insurance currently but hoping to go to full time at [employer] soon so he will be offered medical insurance; receiving meds at no charge through [facility] [facility] and [place] through [employer] patient assistance  Understanding of regimen: good Understanding of indications:  good Potential of compliance: good Patient understands to avoid NSAIDs. Patient understands to avoid decongestants.    Pertinent Lab Values: . 08/21/2015 - Serum creatinine 0.71 (BL 0.7-0.8), CO2 26, Potassium , Sodium 3.7, BNP 62.8 (05/25/15)  Vital Signs: . Weight: 196.2 lb (last clinic weight: 196 lb) . Blood pressure: 122/82 mmHg  . Heart rate: 78 bpm   Heart Failure Assessment & Plan: . Ejection fraction: 20-25% (05/25/15) . NYHA symptom class: I-II . Based on clinical presentation, vital signs, and recent labs, will recommend to increase Entresto to 49/51 mg PO BID  Summary: 1. Chronic systolic CHF: Nonischemic cardiomyopathy. Last echo with EF 20-25% (2/17). ?Viral myocarditis as etiology of cardiomyopathy. Symptoms NYHA class I-II.  - Volume status stable. Not on any diuretics.  - Continue coreg 9.375 mg bid.  - Increase Entresto to 49/51 mg PO bid.  - Consider addition of Bidil at next appointment if still have BP room since patient is AA - Disease state pathophysiology, medication indication, mechanism and side effects reviewed at length with pt, verbalized understanding and grateful for the information. 2. Hyperlipidemia: Markedly elevated LDL in 7/16, now down to 119 on 07/03/15. He is on atorvastatin + fish oil.  3. Diabetes: Per PCP. Admits to not having made an appointment with PCP in at least 3 months. BG on BMET today elevated at 287 mg/dL. Discussed importance of control and compliance with meds and visits.    1) Medication changes: Increase Entresto to 49/51 mg PO BID  2) Labs: BMET today and at follow up with Dr. Shirlee Latch  3) Follow-up: ECHO this pm, Dr. Shirlee Latch on 6/6   Tyler Deis. Bonnye Fava, PharmD, BCPS, CPP Clinical Pharmacist Pager: [phone] Phone: [phone] 08/21/2015 11:14 AM

## 2015-09-04 MED FILL — ?ATORVASTATIN 40MG TABLET: 40 | 30 days supply | Qty: 30 | Fill #4

## 2015-09-04 MED FILL — CARVEDILOL 6.25 MG TABLET: 6.25 | 30 days supply | Qty: 60 | Fill #4

## 2015-09-04 MED FILL — metFORMIN HCL 1000 MG TABS: 1000 | 30 days supply | Qty: 60 | Fill #4

## 2015-09-04 MED FILL — ?ENTRESTO 24 MG-26 MG TABLE: 24 MG-26 MG | 26 days supply | Qty: 52 | Fill #1

## 2015-09-04 MED FILL — !NOVOLOG 100UNITS/ML VIAL: 100/ML | 6 days supply | Qty: 10 | Fill #4

## 2015-09-19 ENCOUNTER — Inpatient Hospital Stay (HOSPITAL_COMMUNITY): Admission: RE | Admit: 2015-09-19 | Payer: Self-pay | Source: Ambulatory Visit

## 2015-10-13 MED FILL — metFORMIN HCL 1000 MG TABS: 1000 | 30 days supply | Qty: 60 | Fill #5

## 2015-10-13 MED FILL — CARVEDILOL 6.25 MG TABLET: 6.25 | 30 days supply | Qty: 60 | Fill #5

## 2015-10-13 MED FILL — ?ATORVASTATIN 40MG TABLET: 40 | 30 days supply | Qty: 30 | Fill #5

## 2015-10-13 MED FILL — !NOVOLOG 100UNITS/ML VIAL: 100/ML | 6 days supply | Qty: 10 | Fill #5

## 2015-11-07 ENCOUNTER — Encounter (HOSPITAL_COMMUNITY): Payer: Self-pay

## 2015-11-07 ENCOUNTER — Encounter: Payer: Self-pay | Admitting: Internal Medicine

## 2015-11-08 ENCOUNTER — Other Ambulatory Visit: Payer: Self-pay | Admitting: Internal Medicine

## 2015-11-08 DIAGNOSIS — I5022 Chronic systolic (congestive) heart failure: Secondary | ICD-10-CM

## 2015-11-15 ENCOUNTER — Encounter: Payer: Self-pay | Admitting: Internal Medicine

## 2015-11-30 ENCOUNTER — Institutional Professional Consult (permissible substitution): Payer: Self-pay | Admitting: Internal Medicine

## 2015-11-30 NOTE — Progress Notes (Deleted)
ELECTROPHYSIOLOGY CONSULT NOTE  Patient ID: Jason SessionsGerald Whelpley, MRN: [id number], DOB/AGE: 32/10/1985 32 y.o. Admit date: (Not on file) Date of Consult: 11/30/2015  Primary Physician: Jeanann LewandowskyJEGEDE, OLUGBEMIGA, MD Primary Cardiologist: *** Consulting Physician ***  Chief Complaint: ***   HPI Jason Stevenson is a 30 y.o. male referred for considertation of an ICD***  diabetes, HTN, and a long history of nonischemic cardiomyopathy presents for CHF clinic evaluation.  He had a cardiac MRI in 1/08 showing low EF, but he says that he had been told about "heart problems" even prior to that.  He does not have a family history of cardiomyopathy that he knows of, but does not know his father's family.  He has generally had sporadic followup over the last few years, but has been seeing Dr Eden EmmsNishan recently.  Coronary CT angiogram in 2014 showed no CAD. Last echo in 2/17 showed EF 20-25%, similar to 2014.    He returns for HF follow up. Last visit lisinopril stopped and entresto started. Denies SOB/PND/Orthopnea.   Does not drink alcohol. He has not had any medications today. Says he rarely misses medications. Continues to work full time at [employer]. Says he lifts 40-70 pounds through out the day.   ECG (11/16): NSR, nonspecific T wave inversions, narrow QRS.    Past Medical History:  Diagnosis Date  . Chronic systolic CHF (congestive heart failure) (HCC) 06/18/2015  . Diabetes mellitus without complication (HCC)   . Hyperlipidemia   . Hypertension   . Nonischemic cardiomyopathy (HCC) Noted as early as 2007   Per chart review (cards consult note 2011), EF of 40% in 2007, down to 20-25% in 2011      Surgical History:  Past Surgical History:  Procedure Laterality Date  . None       Home Meds: Prior to Admission medications   Medication Sig Start Date End Date Taking? Authorizing Provider  atorvastatin (LIPITOR) 40 MG tablet Take 1 tablet (40 mg total) by mouth daily at 6 PM. 02/20/15   Wendall StadePeter  C Nishan, MD  carvedilol (COREG) 6.25 MG tablet Take 1.5 tablets (9.375 mg total) by mouth 2 (two) times daily with a meal. 08/07/15   Amy D Clegg, NP  insulin aspart (NOVOLOG) 100 UNIT/ML injection Inject 10-50 Units into the skin 3 (three) times daily with meals. 11/10/14   Quentin Angstlugbemiga E Jegede, MD  Insulin Syringes, Disposable, U-100 1 ML MISC Insulin syringes 30-gauge 11/20/12   Ripudeep Jenna LuoK Rai, MD  metFORMIN (GLUCOPHAGE) 1000 MG tablet Take 500 mg by mouth 2 (two) times daily with a meal.    Historical Provider, MD  nitroGLYCERIN (NITROSTAT) 0.4 MG SL tablet Place 1 tablet (0.4 mg total) under the tongue every 5 (five) minutes as needed for chest pain. Patient not taking: Reported on 06/16/2015 02/20/15   Wendall StadePeter C Nishan, MD  Omega-3 Fatty Acids (FISH OIL) 1000 MG CPDR Take 2,000 g by mouth daily. 07/06/15   Laurey Moralealton S McLean, MD  sacubitril-valsartan (ENTRESTO) 49-51 MG Take 1 tablet by mouth 2 (two) times daily. 08/21/15   Laurey Moralealton S McLean, MD    Allergies: No Known Allergies  Social History   Social History  . Marital status: Single    Spouse name: N/A  . Number of children: N/A  . Years of education: N/A   Occupational History  . [employer]    Social History Main Topics  . Smoking status: Never Smoker  . Smokeless tobacco: Never Used  . Alcohol use Yes  Comment: "occasional" when "hanging out with the wrong people" No recent use.  . Drug use: No     Comment: Last in 2013  . Sexual activity: Not on file   Other Topics Concern  . Not on file   Social History Narrative   Pt lives alone. Has no information on father.     Family History  Problem Relation Age of Onset  . Diabetes Mother   . Hypertension Mother   . Stroke Maternal Aunt   . Heart attack Neg Hx      ROS:  Please see the history of present illness.   {ros master:[id number]}  All other systems reviewed and negative.    Physical Exam:*** There were no vitals taken for this visit. General: Well developed, well  nourished male in no acute distress. Head: Normocephalic, atraumatic, sclera non-icteric, no xanthomas, nares are without discharge. EENT: normal  Lymph Nodes:  none Neck: Negative for carotid bruits. JVD not elevated. Back:without scoliosis kyphosis*** Lungs: Clear bilaterally to auscultation without wheezes, rales, or rhonchi. Breathing is unlabored. Heart: RRR with S1 S2. No *** ***/6 systolic*** murmur . No rubs, or gallops appreciated. Abdomen: Soft, non-tender, non-distended with normoactive bowel sounds. No hepatomegaly. No rebound/guarding. No obvious abdominal masses. Msk:  Strength and tone appear normal for age. Extremities: No clubbing or cyanosis. No*** ***+*** edema.  Distal pedal pulses are 2+ and equal bilaterally. Skin: Warm and Dry Neuro: Alert and oriented X 3. CN III-XII intact Grossly normal sensory and motor function . Psych:  Responds to questions appropriately with a normal affect.      Labs: Cardiac Enzymes No results for input(s): CKTOTAL, CKMB, TROPONINI in the last 72 hours. CBC Lab Results  Component Value Date   WBC 4.2 11/05/2012   HGB 15.7 11/05/2012   HCT 44.7 11/05/2012   MCV 82.9 11/05/2012   PLT 235 11/05/2012   PROTIME: No results for input(s): LABPROT, INR in the last 72 hours. Chemistry No results for input(s): NA, K, CL, CO2, BUN, CREATININE, CALCIUM, PROT, BILITOT, ALKPHOS, ALT, AST, GLUCOSE in the last 168 hours.  Invalid input(s): LABALBU Lipids Lab Results  Component Value Date   CHOL 210 (H) 07/03/2015   HDL 42 07/03/2015   LDLCALC 119 (H) 07/03/2015   TRIG 244 (H) 07/03/2015   BNP No results found for: PROBNP Thyroid Function Tests: No results for input(s): TSH, T4TOTAL, T3FREE, THYROIDAB in the last 72 hours.  Invalid input(s): FREET3 Miscellaneous No results found for: DDIMER  Radiology/Studies:  No results found.  EKG: ***   Assessment and Plan: *** Sherryl Manges

## 2015-12-01 ENCOUNTER — Encounter: Payer: Self-pay | Admitting: Internal Medicine

## 2015-12-19 ENCOUNTER — Other Ambulatory Visit: Payer: Self-pay | Admitting: *Deleted

## 2015-12-19 ENCOUNTER — Inpatient Hospital Stay (HOSPITAL_COMMUNITY): Admission: RE | Admit: 2015-12-19 | Payer: Self-pay | Source: Ambulatory Visit

## 2015-12-19 DIAGNOSIS — E1165 Type 2 diabetes mellitus with hyperglycemia: Secondary | ICD-10-CM

## 2015-12-26 ENCOUNTER — Other Ambulatory Visit: Payer: Self-pay | Admitting: *Deleted

## 2015-12-26 DIAGNOSIS — E1165 Type 2 diabetes mellitus with hyperglycemia: Secondary | ICD-10-CM

## 2015-12-26 MED ORDER — INSULIN ASPART 100 UNIT/ML ~~LOC~~ SOLN: 10.0000 [IU] | Freq: Three times a day (TID) | SUBCUTANEOUS | 3 refills | Status: DC

## 2015-12-26 NOTE — Telephone Encounter (Signed)
PRINTED FOR PASS PROGRAM 

## 2016-01-09 ENCOUNTER — Other Ambulatory Visit: Payer: Self-pay | Admitting: Internal Medicine

## 2016-01-09 DIAGNOSIS — E1165 Type 2 diabetes mellitus with hyperglycemia: Secondary | ICD-10-CM

## 2016-01-09 MED FILL — ATORVASTATIN 40 MG TABLET: 40 | 30 days supply | Qty: 30 | Fill #6

## 2016-01-10 MED FILL — !NOVOLOG 100UNITS/ML VIAL: 100/ML | 33 days supply | Qty: 50 | Fill #0

## 2016-01-10 MED FILL — metFORMIN HCL 1000 MG TABS: 1000 | 90 days supply | Qty: 180 | Fill #0

## 2016-03-01 ENCOUNTER — Other Ambulatory Visit (HOSPITAL_COMMUNITY): Payer: Self-pay | Admitting: Cardiology

## 2016-03-01 ENCOUNTER — Other Ambulatory Visit: Payer: Self-pay | Admitting: Cardiovascular Disease

## 2016-03-01 ENCOUNTER — Encounter: Payer: Self-pay | Admitting: Internal Medicine

## 2016-03-01 DIAGNOSIS — E785 Hyperlipidemia, unspecified: Secondary | ICD-10-CM

## 2016-03-01 MED FILL — CARVEDILOL 6.25 MG TABLET: 6.25 | 30 days supply | Qty: 90 | Fill #0

## 2016-03-01 NOTE — Telephone Encounter (Signed)
Okay to refill or should this be deferred to the CHF clinic? Please advise. Thanks, MI

## 2016-03-04 MED ORDER — FISH OIL 1000 MG PO CPDR: 2000.0000 mg | DELAYED_RELEASE_CAPSULE | Freq: Every day | ORAL | 3 refills | Status: DC

## 2016-03-04 MED FILL — OMEGA-3 ETHYL ESTERS 1 GM C: 1 | 30 days supply | Qty: 60 | Fill #0

## 2016-03-04 NOTE — Telephone Encounter (Signed)
This patient is followed in the Heart Failure Clinic 

## 2016-03-05 ENCOUNTER — Institutional Professional Consult (permissible substitution): Payer: Self-pay | Admitting: Internal Medicine

## 2016-03-05 NOTE — Progress Notes (Deleted)
ELECTROPHYSIOLOGY CONSULT NOTE  Patient ID: Jason SessionsGerald Gockley, MRN: [id number], DOB/AGE: 32/10/1985 32 y.o. Admit date: (Not on file) Date of Consult: 03/05/2016  Primary Physician: Jeanann LewandowskyJEGEDE, OLUGBEMIGA, MD Primary Cardiologist: *** Consulting Physician ***  Chief Complaint: ***   HPI Jason Stevenson is a 30 y.o. male *** Past Medical History:  Diagnosis Date  . Chronic systolic CHF (congestive heart failure) (HCC) 06/18/2015  . Diabetes mellitus without complication (HCC)   . Hyperlipidemia   . Hypertension   . Nonischemic cardiomyopathy (HCC) Noted as early as 2007   Per chart review (cards consult note 2011), EF of 40% in 2007, down to 20-25% in 2011      Surgical History:  Past Surgical History:  Procedure Laterality Date  . None       Home Meds: Prior to Admission medications   Medication Sig Start Date End Date Taking? Authorizing Provider  atorvastatin (LIPITOR) 40 MG tablet Take 1 tablet (40 mg total) by mouth daily at 6 PM. 02/20/15   Wendall StadePeter C Nishan, MD  carvedilol (COREG) 6.25 MG tablet Take 1.5 tablets (9.375 mg total) by mouth 2 (two) times daily with a meal. 08/07/15   Amy D Clegg, NP  insulin aspart (NOVOLOG) 100 UNIT/ML injection INJECT 10-50 UNITS INTO THE SKIN 3 TIMES DAILY WITH MEALS. 01/09/16   Quentin Angstlugbemiga E Jegede, MD  Insulin Syringes, Disposable, U-100 1 ML MISC Insulin syringes 30-gauge 11/20/12   Ripudeep K Rai, MD  metFORMIN (GLUCOPHAGE) 1000 MG tablet TAKE 1 TABLET BY MOUTH 2 TIMES DAILY WITH A MEAL 01/09/16   Quentin Angstlugbemiga E Jegede, MD  nitroGLYCERIN (NITROSTAT) 0.4 MG SL tablet Place 1 tablet (0.4 mg total) under the tongue every 5 (five) minutes as needed for chest pain. Patient not taking: Reported on 06/16/2015 02/20/15   Wendall StadePeter C Nishan, MD  Omega-3 Fatty Acids (FISH OIL) 1000 MG CPDR Take 2,000 mg by mouth daily. 03/04/16   Laurey Moralealton S McLean, MD  sacubitril-valsartan (ENTRESTO) 49-51 MG Take 1 tablet by mouth 2 (two) times daily. 08/21/15   Laurey Moralealton S  McLean, MD    Allergies: No Known Allergies  Social History   Social History  . Marital status: Single    Spouse name: N/A  . Number of children: N/A  . Years of education: N/A   Occupational History  . [employer]    Social History Main Topics  . Smoking status: Never Smoker  . Smokeless tobacco: Never Used  . Alcohol use Yes     Comment: "occasional" when "hanging out with the wrong people" No recent use.  . Drug use: No     Comment: Last in 2013  . Sexual activity: Not on file   Other Topics Concern  . Not on file   Social History Narrative   Pt lives alone. Has no information on father.     Family History  Problem Relation Age of Onset  . Diabetes Mother   . Hypertension Mother   . Stroke Maternal Aunt   . Heart attack Neg Hx      ROS:  Please see the history of present illness.   {ros [id number]}  All other systems reviewed and negative.    Physical Exam:*** There were no vitals taken for this visit. General: Well developed, well nourished male in no acute distress. Head: Normocephalic, atraumatic, sclera non-icteric, no xanthomas, nares are without discharge. EENT: normal  Lymph Nodes:  none Neck: Negative for carotid bruits. JVD not elevated. Back:without scoliosis kyphosis*** Lungs:  Clear bilaterally to auscultation without wheezes, rales, or rhonchi. Breathing is unlabored. Heart: RRR with S1 S2. No *** ***/6 systolic*** murmur . No rubs, or gallops appreciated. Abdomen: Soft, non-tender, non-distended with normoactive bowel sounds. No hepatomegaly. No rebound/guarding. No obvious abdominal masses. Msk:  Strength and tone appear normal for age. Extremities: No clubbing or cyanosis. No*** ***+*** edema.  Distal pedal pulses are 2+ and equal bilaterally. Skin: Warm and Dry Neuro: Alert and oriented X 3. CN III-XII intact Grossly normal sensory and motor function . Psych:  Responds to questions appropriately with a normal affect.       Labs: Cardiac Enzymes No results for input(s): CKTOTAL, CKMB, TROPONINI in the last 72 hours. CBC Lab Results  Component Value Date   WBC 4.2 11/05/2012   HGB 15.7 11/05/2012   HCT 44.7 11/05/2012   MCV 82.9 11/05/2012   PLT 235 11/05/2012   PROTIME: No results for input(s): LABPROT, INR in the last 72 hours. Chemistry No results for input(s): NA, K, CL, CO2, BUN, CREATININE, CALCIUM, PROT, BILITOT, ALKPHOS, ALT, AST, GLUCOSE in the last 168 hours.  Invalid input(s): LABALBU Lipids Lab Results  Component Value Date   CHOL 210 (H) 07/03/2015   HDL 42 07/03/2015   LDLCALC 119 (H) 07/03/2015   TRIG 244 (H) 07/03/2015   BNP No results found for: PROBNP Thyroid Function Tests: No results for input(s): TSH, T4TOTAL, T3FREE, THYROIDAB in the last 72 hours.  Invalid input(s): FREET3 Miscellaneous No results found for: DDIMER  Radiology/Studies:  No results found.  EKG: ***   Assessment and Plan: *** Sherryl Manges

## 2016-03-06 ENCOUNTER — Encounter: Payer: Self-pay | Admitting: Cardiovascular Disease

## 2016-03-06 ENCOUNTER — Ambulatory Visit (INDEPENDENT_AMBULATORY_CARE_PROVIDER_SITE_OTHER): Payer: Self-pay | Admitting: Cardiovascular Disease

## 2016-03-06 VITALS — BP 112/80 | HR 83 | Ht 71.0 in | Wt 194.0 lb

## 2016-03-06 DIAGNOSIS — I428 Other cardiomyopathies: Secondary | ICD-10-CM

## 2016-03-06 DIAGNOSIS — E785 Hyperlipidemia, unspecified: Secondary | ICD-10-CM

## 2016-03-06 DIAGNOSIS — I5022 Chronic systolic (congestive) heart failure: Secondary | ICD-10-CM

## 2016-03-06 MED ORDER — ATORVASTATIN CALCIUM 40 MG PO TABS: 40.0000 mg | ORAL_TABLET | Freq: Every day | ORAL | 3 refills | Status: DC

## 2016-03-06 NOTE — Patient Instructions (Signed)
Medication Instructions:  Your physician recommends that you continue on your current medications as directed. Please refer to the Current Medication list given to you today.   Labwork: None  Testing/Procedures: None  Follow-Up: Your physician recommends that you schedule a follow-up appointment in 3 months with the [facility].  Your physician wants you to follow-up in: 6 months with Dr. Eden Emms. You will receive a reminder letter in the mail two months in advance. If you don't receive a letter, please call our office to schedule the follow-up appointment.   Any Other Special Instructions Will Be Listed Below (If Applicable).     If you need a refill on your cardiac medications before your next appointment, please call your pharmacy.

## 2016-03-06 NOTE — Progress Notes (Signed)
Patient ID: Jason Stevenson, male   DOB: Aug 30, 1985, 30 y.o.   MRN: [id number]  PCP: Dr Hyman Hopes Cardiology: Dr Eden Emms HF Cardiology: Dr Shirlee Latch  30 y.o.  with diabetes, HTN, and a long history of nonischemic cardiomyopathy  He had a cardiac MRI in 1/08 showing low EF, but he says that he had been told about "heart problems" even prior to that.  He does not have a family history of cardiomyopathy that he knows of, but does not know his father's family.  He has generally had sporadic followup over the last few years, but has been seeing Dr Eden Emms recently.  Coronary CT angiogram in 2014 showed no CAD. Last echo in 2/17 showed EF 20-25%, similar to 2014.    Has seen CHF clinic  Last visit lisinopril stopped and entresto started. Denies SOB/PND/Orthopnea.   Does not drink alcohol.  He lost job at [employer] and has been at [employer] for 2 months Should get insurance in  January but has not been following BS or had any labs. Taking meds other than lipitor Which he ran out of    ECG (11/16): NSR, nonspecific T wave inversions, narrow QRS.  Labs (7/16): LDL 282, TGs 273 Labs (2/17): K 4, creatinine 0.78, BNP 63 Labs (07/03/2015): K 4.1 Creatinine 0.89   PMH: 1. Type II diabetes 2. HTN 3. Hyperlipidemia 4. Cardiomyopathy: Suspect nonischemic.  - Cardiac MRI (1/08): EF 45%, no late gadolinium enhancement.  - Echo (7/14) with EF 20-25%, diffuse hypokinesis.  - Coronary CT angiogram (7/14) with coronary calcium score of 0 and normal coronaries.  - Echo (2/17) with EF 20-25%, diffuse hypokinesis, normal RV size and systolic function.   FH: Diabetes in mother's family, does not know history on father's side.   SH: Nonsmoker, works at [employer], rare ETOH, no drugs.    ROS: All systems reviewed and negative except as per HPI.   Current Outpatient Prescriptions  Medication Sig Dispense Refill  . atorvastatin (LIPITOR) 40 MG tablet Take 1 tablet (40 mg total) by mouth daily at 6 PM. 90 tablet 3  .  carvedilol (COREG) 6.25 MG tablet Take 1.5 tablets (9.375 mg total) by mouth 2 (two) times daily with a meal. 270 tablet 6  . insulin aspart (NOVOLOG) 100 UNIT/ML injection INJECT 10-50 UNITS INTO THE SKIN 3 TIMES DAILY WITH MEALS. 50 mL 3  . Insulin Syringes, Disposable, U-100 1 ML MISC Insulin syringes 30-gauge 100 each 12  . metFORMIN (GLUCOPHAGE) 1000 MG tablet TAKE 1 TABLET BY MOUTH 2 TIMES DAILY WITH A MEAL 180 tablet 0  . nitroGLYCERIN (NITROSTAT) 0.4 MG SL tablet Place 1 tablet (0.4 mg total) under the tongue every 5 (five) minutes as needed for chest pain. 25 tablet 2  . Omega-3 Fatty Acids (FISH OIL) 1000 MG CPDR Take 2,000 mg by mouth daily. 60 capsule 3  . sacubitril-valsartan (ENTRESTO) 49-51 MG Take 1 tablet by mouth 2 (two) times daily. 60 tablet 11   No current facility-administered medications for this visit.    BP 112/80   Pulse 83   Ht 5\' 11"  (1.803 m)   Wt 88 kg (194 lb)   SpO2 92%   BMI 27.06 kg/m  General: NAD. Ambulated in the clinic without difficulty.  Neck: No JVD, no thyromegaly or thyroid nodule.  Lungs: Clear to auscultation bilaterally with normal respiratory effort. CV: Nondisplaced PMI.  Heart regular S1/S2, no S3/S4, no murmur.  No peripheral edema.  No carotid bruit.  Normal pedal pulses.  Abdomen:  Soft, nontender, no hepatosplenomegaly, no distention.  Skin: Intact without lesions or rashes.  Neurologic: Alert and oriented x 3.  Psych: Normal affect. Extremities: No clubbing or cyanosis.  HEENT: Normal.   Assessment/Plan: 1. Chronic systolic CHF: Nonischemic cardiomyopathy. Last echo with EF 20-25% (2/17).  This is similar to prior echo in 2014.  Coronary CTA in 2014 showed no CAD.  Cardiac MRI in 2008 showed no late gadolinium enhancement.  He denies drug or ETOH abuse.  No known family history of cardiomyopathy.  ?Viral myocarditis as etiology of cardiomyopathy.  Symptoms NYHA class I-II.   -Volume status stable. Not on any diuretics.   - coreg  9.375 mg bid.  - Continue Entresto 24/26 bid.  Stressed compliance  - Hold off on spiro for now.   CHF clinic can discuss AICD but would not pursue without insurance at this time   2. Hyperlipidemia: Markedly elevated LDL in 7/16.  Repeat labs in January when  He has insurance stressed need to take statin  3. Diabetes: Per PCP. Encouraged him to see primary     Charlton Hawseter Haset [place]aca NP-C  03/06/2016

## 2016-03-14 ENCOUNTER — Ambulatory Visit: Payer: Self-pay | Admitting: Internal Medicine

## 2016-04-17 ENCOUNTER — Encounter: Payer: Self-pay | Admitting: Internal Medicine

## 2016-04-17 ENCOUNTER — Ambulatory Visit: Payer: Self-pay | Attending: Internal Medicine | Admitting: Internal Medicine

## 2016-04-17 VITALS — BP 126/75 | HR 73 | Temp 98.3°F | Resp 18 | Ht 71.0 in | Wt 191.6 lb

## 2016-04-17 DIAGNOSIS — Z23 Encounter for immunization: Secondary | ICD-10-CM

## 2016-04-17 DIAGNOSIS — I428 Other cardiomyopathies: Secondary | ICD-10-CM

## 2016-04-17 DIAGNOSIS — E1165 Type 2 diabetes mellitus with hyperglycemia: Secondary | ICD-10-CM

## 2016-04-17 DIAGNOSIS — I110 Hypertensive heart disease with heart failure: Secondary | ICD-10-CM | POA: Insufficient documentation

## 2016-04-17 DIAGNOSIS — Z794 Long term (current) use of insulin: Secondary | ICD-10-CM

## 2016-04-17 DIAGNOSIS — Z79899 Other long term (current) drug therapy: Secondary | ICD-10-CM | POA: Insufficient documentation

## 2016-04-17 DIAGNOSIS — I5022 Chronic systolic (congestive) heart failure: Secondary | ICD-10-CM | POA: Insufficient documentation

## 2016-04-17 DIAGNOSIS — E119 Type 2 diabetes mellitus without complications: Secondary | ICD-10-CM

## 2016-04-17 DIAGNOSIS — IMO0001 Reserved for inherently not codable concepts without codable children: Secondary | ICD-10-CM

## 2016-04-17 DIAGNOSIS — E785 Hyperlipidemia, unspecified: Secondary | ICD-10-CM

## 2016-04-17 LAB — POCT URINALYSIS DIPSTICK
BILIRUBIN UA: NEGATIVE
Glucose, UA: 500
Ketones, UA: NEGATIVE
LEUKOCYTES UA: NEGATIVE
NITRITE UA: NEGATIVE
PH UA: 6
PROTEIN UA: 100
Spec Grav, UA: >=1.03
Urobilinogen, UA: 1

## 2016-04-17 LAB — LIPID PANEL
Cholesterol: 297 mg/dL — ABNORMAL HIGH (ref <200)
HDL: 38 mg/dL — ABNORMAL LOW (ref >40)
LDL CALC: 238 mg/dL — AB (ref <100)
Total CHOL/HDL Ratio: 7.8 Ratio — ABNORMAL HIGH (ref <5.0)
Triglycerides: 104 mg/dL (ref <150)
VLDL: 21 mg/dL (ref <30)

## 2016-04-17 LAB — COMPLETE METABOLIC PANEL WITH GFR
ALT: 13 U/L (ref 9–46)
AST: 16 U/L (ref 10–40)
Albumin: 4 g/dL (ref 3.6–5.1)
Alkaline Phosphatase: 59 U/L (ref 40–115)
BUN: 9 mg/dL (ref 7–25)
CALCIUM: 8.8 mg/dL (ref 8.6–10.3)
CHLORIDE: 102 mmol/L (ref 98–110)
CO2: 26 mmol/L (ref 20–31)
CREATININE: 0.8 mg/dL (ref 0.60–1.35)
GFR, Est African American: >89 mL/min (ref >=60)
GFR, Est Non African American: >89 mL/min (ref >=60)
Glucose, Bld: 331 mg/dL — ABNORMAL HIGH (ref 65–99)
Potassium: 4.2 mmol/L (ref 3.5–5.3)
Sodium: 138 mmol/L (ref 135–146)
TOTAL PROTEIN: 7 g/dL (ref 6.1–8.1)
Total Bilirubin: 0.5 mg/dL (ref 0.2–1.2)

## 2016-04-17 LAB — POCT GLYCOSYLATED HEMOGLOBIN (HGB A1C): Hemoglobin A1C: 11.5

## 2016-04-17 LAB — GLUCOSE, POCT (MANUAL RESULT ENTRY): POC GLUCOSE: 361 mg/dL — AB (ref 70–99)

## 2016-04-17 MED ORDER — METFORMIN HCL 1000 MG PO TABS: ORAL_TABLET | ORAL | 3 refills | Status: DC

## 2016-04-17 MED ORDER — GLIPIZIDE 5 MG PO TABS: 5.0000 mg | ORAL_TABLET | Freq: Two times a day (BID) | ORAL | 3 refills | Status: DC

## 2016-04-17 MED ORDER — CARVEDILOL 6.25 MG PO TABS: 9.3750 mg | ORAL_TABLET | Freq: Two times a day (BID) | ORAL | 6 refills | Status: DC

## 2016-04-17 MED ORDER — SACUBITRIL-VALSARTAN 49-51 MG PO TABS: 1.0000 | ORAL_TABLET | Freq: Two times a day (BID) | ORAL | 11 refills | Status: DC

## 2016-04-17 MED ORDER — ATORVASTATIN CALCIUM 40 MG PO TABS: 40.0000 mg | ORAL_TABLET | Freq: Every day | ORAL | 3 refills | Status: DC

## 2016-04-17 MED FILL — ?CARVEDILOL 6.25 MG TABLET: 6.25 | 30 days supply | Qty: 90 | Fill #0

## 2016-04-17 MED FILL — ?GLIPIZIDE 5 MG TABLET: 5 MG | 30 days supply | Qty: 60 | Fill #0

## 2016-04-17 MED FILL — ?ATORVASTATIN 40MG TABLET: 40 | 30 days supply | Qty: 30 | Fill #0

## 2016-04-17 MED FILL — ?METFORMIN HCL 1,000 MG TAB: 1000 | 30 days supply | Qty: 60 | Fill #0

## 2016-04-17 NOTE — Progress Notes (Signed)
Jason Stevenson, is a 31 y.o. male  XBJ:[id number]  HYQ:[id number]  DOB - Jun 15, 1985  Chief Complaint  Patient presents with  . Diabetes       Subjective:   Jason Stevenson is a 31 y.o. male with history of type 2 diabetes mellitus without complication, hypertension, nonischemic heart disease and hyperlipidemia, extremely nonadherent to medications and dietary advice here today for a follow up visit after being lost to follow up for over one year. Patient needs refill of his medications, he recently joined a gym and now attempting to modify his lifestyle. He continues to work 2 jobs and stays busy all the time. He does not use his insulin as prescribed, skipping multiple doses, sometimes use only one time in a week. "He does not like needles". He has no complain today. Patient has No headache, No chest pain, No abdominal pain - No Nausea, No new weakness tingling or numbness, No Cough - SOB.  Problem  Dyslipidemia  Needs Flu Shot    ALLERGIES: No Known Allergies  PAST MEDICAL HISTORY: Past Medical History:  Diagnosis Date  . Chronic systolic CHF (congestive heart failure) (HCC) 06/18/2015  . Diabetes mellitus without complication (HCC)   . Hyperlipidemia   . Hypertension   . Nonischemic cardiomyopathy (HCC) Noted as early as 2007   Per chart review (cards consult note 2011), EF of 40% in 2007, down to 20-25% in 2011    MEDICATIONS AT HOME: Prior to Admission medications   Medication Sig Start Date End Date Taking? Authorizing Provider  atorvastatin (LIPITOR) 40 MG tablet Take 1 tablet (40 mg total) by mouth daily at 6 PM. 04/17/16  Yes Madalena Kesecker E Hyman Hopes, MD  carvedilol (COREG) 6.25 MG tablet Take 1.5 tablets (9.375 mg total) by mouth 2 (two) times daily with a meal. 04/17/16  Yes Lawyer Washabaugh E Murl Zogg, MD  insulin aspart (NOVOLOG) 100 UNIT/ML injection INJECT 10-50 UNITS INTO THE SKIN 3 TIMES DAILY WITH MEALS. 01/09/16  Yes Quentin Angst, MD  Insulin Syringes, Disposable,  U-100 1 ML MISC Insulin syringes 30-gauge 11/20/12  Yes Ripudeep K Rai, MD  metFORMIN (GLUCOPHAGE) 1000 MG tablet TAKE 1 TABLET BY MOUTH 2 TIMES DAILY WITH A MEAL 04/17/16  Yes Quentin Angst, MD  Omega-3 Fatty Acids (FISH OIL) 1000 MG CPDR Take 2,000 mg by mouth daily. 03/04/16  Yes Laurey Morale, MD  sacubitril-valsartan (ENTRESTO) 49-51 MG Take 1 tablet by mouth 2 (two) times daily. 04/17/16  Yes Quentin Angst, MD  glipiZIDE (GLUCOTROL) 5 MG tablet Take 1 tablet (5 mg total) by mouth 2 (two) times daily before a meal. 04/17/16   Quentin Angst, MD  nitroGLYCERIN (NITROSTAT) 0.4 MG SL tablet Place 1 tablet (0.4 mg total) under the tongue every 5 (five) minutes as needed for chest pain. Patient not taking: Reported on 04/17/2016 02/20/15   Wendall Stade, MD    Objective:   Vitals:   04/17/16 1125  BP: 126/75  Pulse: 73  Resp: 18  Temp: 98.3 F (36.8 C)  TempSrc: Oral  SpO2: 97%  Weight: 191 lb 9.6 oz (86.9 kg)  Height: 5\' 11"  (1.803 m)   Exam General appearance : Awake, alert, not in any distress. Speech Clear. Not toxic looking HEENT: Atraumatic and Normocephalic, pupils equally reactive to light and accomodation Neck: Supple, no JVD. No cervical lymphadenopathy.  Chest: Good air entry bilaterally, no added sounds  CVS: S1 S2 regular, no murmurs.  Abdomen: Bowel sounds present, Non tender and not  distended with no gaurding, rigidity or rebound. Extremities: B/L Lower Ext shows no edema, both legs are warm to touch Neurology: Awake alert, and oriented X 3, CN II-XII intact, Non focal Skin: No Rash  Data Review Lab Results  Component Value Date   HGBA1C 11.5 04/17/2016   HGBA1C 14.50 11/10/2014   HGBA1C 9.2 12/23/2013    Assessment & Plan   1. Uncontrolled type 2 diabetes mellitus without complication, with long-term current use of insulin (HCC)  - POCT A1C is 11.5% today - Glucose (CBG) - Microalbumin/Creatinine Ratio, Urine - Urinalysis Dipstick - COMPLETE  METABOLIC PANEL WITH GFR Add - glipiZIDE (GLUCOTROL) 5 MG tablet; Take 1 tablet (5 mg total) by mouth 2 (two) times daily before a meal.  Dispense: 60 tablet; Refill: 3  Continue - metFORMIN (GLUCOPHAGE) 1000 MG tablet; TAKE 1 TABLET BY MOUTH 2 TIMES DAILY WITH A MEAL  Dispense: 180 tablet; Refill: 3  Aim for 30 minutes of exercise most days. Rethink what you drink. Water is great! Aim for 2-3 Carb Choices per meal (30-45 grams) +/- 1 either way  Aim for 0-15 Carbs per snack if hungry  Include protein in moderation with your meals and snacks  Consider reading food labels for Total Carbohydrate and Fat Grams of foods  Consider checking BG at alternate times per day  Continue taking medication as directed Be mindful about how much sugar you are adding to beverages and other foods. Fruit Punch - find one with no sugar  Measure and decrease portions of carbohydrate foods  Make your plate and don't go back for seconds  2. Needs flu shot  - Flu Vaccine QUAD 36+ mos PF IM (Fluarix & Fluzone Quad PF)  3. Non-ischemic cardiomyopathy (HCC)  - sacubitril-valsartan (ENTRESTO) 49-51 MG; Take 1 tablet by mouth 2 (two) times daily.  Dispense: 60 tablet; Refill: 11 - carvedilol (COREG) 6.25 MG tablet; Take 1.5 tablets (9.375 mg total) by mouth 2 (two) times daily with a meal.  Dispense: 270 tablet; Refill: 6  4. Dyslipidemia  - Lipid panel - atorvastatin (LIPITOR) 40 MG tablet; Take 1 tablet (40 mg total) by mouth daily at 6 PM.  Dispense: 90 tablet; Refill: 3  Patient have been counseled extensively about nutrition and exercise. Other issues discussed during this visit include: low cholesterol diet, weight control and daily exercise, foot care, annual eye examinations at Ophthalmology, importance of adherence with medications and regular follow-up. We also discussed long term complications of uncontrolled diabetes and hypertension.   Return in about 3 months (around 07/16/2016) for Hemoglobin A1C  and Follow up, DM, Routine Follow Up.  The patient was given clear instructions to go to ER or return to medical center if symptoms don't improve, worsen or new problems develop. The patient verbalized understanding. The patient was told to call to get lab results if they haven't heard anything in the next week.   This note has been created with Education officer, environmental. Any transcriptional errors are unintentional.    Jeanann Lewandowsky, MD, MHA, FACP, FAAP, CPE [facility] [place], [place] [phone]   04/17/2016, 12:02 PM

## 2016-04-17 NOTE — Patient Instructions (Signed)
Diabetes and Foot Care Diabetes may cause you to have problems because of poor blood supply (circulation) to your feet and legs. This may cause the skin on your feet to become thinner, break easier, and heal more slowly. Your skin may become dry, and the skin may peel and crack. You may also have nerve damage in your legs and feet causing decreased feeling in them. You may not notice minor injuries to your feet that could lead to infections or more serious problems. Taking care of your feet is one of the most important things you can do for yourself. Follow these instructions at home:  Wear shoes at all times, even in the house. Do not go barefoot. Bare feet are easily injured.  Check your feet daily for blisters, cuts, and redness. If you cannot see the bottom of your feet, use a mirror or ask someone for help.  Wash your feet with warm water (do not use hot water) and mild soap. Then pat your feet and the areas between your toes until they are completely dry. Do not soak your feet as this can dry your skin.  Apply a moisturizing lotion or petroleum jelly (that does not contain alcohol and is unscented) to the skin on your feet and to dry, brittle toenails. Do not apply lotion between your toes.  Trim your toenails straight across. Do not dig under them or around the cuticle. File the edges of your nails with an emery board or nail file.  Do not cut corns or calluses or try to remove them with medicine.  Wear clean socks or stockings every day. Make sure they are not too tight. Do not wear knee-high stockings since they may decrease blood flow to your legs.  Wear shoes that fit properly and have enough cushioning. To break in new shoes, wear them for just a few hours a day. This prevents you from injuring your feet. Always look in your shoes before you put them on to be sure there are no objects inside.  Do not cross your legs. This may decrease the blood flow to your feet.  If you find a  minor scrape, cut, or break in the skin on your feet, keep it and the skin around it clean and dry. These areas may be cleansed with mild soap and water. Do not cleanse the area with peroxide, alcohol, or iodine.  When you remove an adhesive bandage, be sure not to damage the skin around it.  If you have a wound, look at it several times a day to make sure it is healing.  Do not use heating pads or hot water bottles. They may burn your skin. If you have lost feeling in your feet or legs, you may not know it is happening until it is too late.  Make sure your health care provider performs a complete foot exam at least annually or more often if you have foot problems. Report any cuts, sores, or bruises to your health care provider immediately. Contact a health care provider if:  You have an injury that is not healing.  You have cuts or breaks in the skin.  You have an ingrown nail.  You notice redness on your legs or feet.  You feel burning or tingling in your legs or feet.  You have pain or cramps in your legs and feet.  Your legs or feet are numb.  Your feet always feel cold. Get help right away if:  There is increasing   redness, swelling, or pain in or around a wound.  There is a red line that goes up your leg.  Pus is coming from a wound.  You develop a fever or as directed by your health care provider.  You notice a bad smell coming from an ulcer or wound. This information is not intended to replace advice given to you by your health care provider. Make sure you discuss any questions you have with your health care provider. Document Released: 03/29/2000 Document Revised: 09/07/2015 Document Reviewed: 09/08/2012 Elsevier Interactive Patient Education  2017 [employer] Blood Glucose Monitoring, Adult Monitoring your blood sugar (glucose) helps you manage your diabetes. It also helps you and your health care provider determine how well your diabetes management plan is  working. Blood glucose monitoring involves checking your blood glucose as often as directed, and keeping a record (log) of your results over time. Why should I monitor my blood glucose? Checking your blood glucose regularly can:  Help you understand how food, exercise, illnesses, and medicines affect your blood glucose.  Let you know what your blood glucose is at any time. You can quickly tell if you are having low blood glucose (hypoglycemia) or high blood glucose (hyperglycemia).  Help you and your health care provider adjust your medicines as needed. When should I check my blood glucose? Follow instructions from your health care provider about how often to check your blood glucose. This may depend on:  The type of diabetes you have.  How well-controlled your diabetes is.  Medicines you are taking. If you have type 1 diabetes:  Check your blood glucose at least 2 times a day.  Also check your blood glucose:  Before every insulin injection.  Before and after exercise.  Between meals.  2 hours after a meal.  Occasionally between 2:00 a.m. and 3:00 a.m., as directed.  Before potentially dangerous tasks, like driving or using heavy machinery.  At bedtime.  You may need to check your blood glucose more often, up to 6-10 times a day:  If you use an insulin pump.  If you need multiple daily injections (MDI).  If your diabetes is not well-controlled.  If you are ill.  If you have a history of severe hypoglycemia.  If you have a history of not knowing when your blood glucose is getting low (hypoglycemia unawareness). If you have type 2 diabetes:  If you take insulin or other diabetes medicines, check your blood glucose at least 2 times a day.  If you are on intensive insulin therapy, check your blood glucose at least 4 times a day. Occasionally, you may also need to check between 2:00 a.m. and 3:00 a.m., as directed.  Also check your blood glucose:  Before and after  exercise.  Before potentially dangerous tasks, like driving or using heavy machinery.  You may need to check your blood glucose more often if:  Your medicine is being adjusted.  Your diabetes is not well-controlled.  You are ill. What is a blood glucose log?  A blood glucose log is a record of your blood glucose readings. It helps you and your health care provider:  Look for patterns in your blood glucose over time.  Adjust your diabetes management plan as needed.  Every time you check your blood glucose, write down your result and notes about things that may be affecting your blood glucose, such as your diet and exercise for the day.  Most glucose meters store a record of glucose readings in   the meter. Some meters allow you to download your records to a computer. How do I check my blood glucose? Follow these steps to get accurate readings of your blood glucose: Supplies needed   Blood glucose meter.  Test strips for your meter. Each meter has its own strips. You must use the strips that come with your meter.  A needle to prick your finger (lancet). Do not use lancets more than once.  A device that holds the lancet (lancing device).  A journal or log book to write down your results. Procedure  Wash your hands with soap and water.  Prick the side of your finger (not the tip) with the lancet. Use a different finger each time.  Gently rub the finger until a small drop of blood appears.  Follow instructions that come with your meter for inserting the test strip, applying blood to the strip, and using your blood glucose meter.  Write down your result and any notes. Alternative testing sites  Some meters allow you to use areas of your body other than your finger (alternative sites) to test your blood.  If you think you may have hypoglycemia, or if you have hypoglycemia unawareness, do not use alternative sites. Use your finger instead.  Alternative sites may not be as  accurate as the fingers, because blood flow is slower in these areas. This means that the result you get may be delayed, and it may be different from the result that you would get from your finger.  The most common alternative sites are:  Forearm.  Thigh.  Palm of the hand. Additional tips  Always keep your supplies with you.  If you have questions or need help, all blood glucose meters have a 24-hour "hotline" number that you can call. You may also contact your health care provider.  After you use a few boxes of test strips, adjust (calibrate) your blood glucose meter by following instructions that came with your meter. This information is not intended to replace advice given to you by your health care provider. Make sure you discuss any questions you have with your health care provider. Document Released: 04/04/2003 Document Revised: 10/20/2015 Document Reviewed: 09/11/2015 Elsevier Interactive Patient Education  2017 [employer]  

## 2016-04-17 NOTE — Progress Notes (Signed)
Patient is here for FU DM  Patient denies pain at this time.  Patient has not taken medication today. Patient had a soda prior to office visit today.  Patient would like the flu vaccine today.  Patient request refills on Atorvastatin, Nitro and Entresto.

## 2016-04-18 ENCOUNTER — Ambulatory Visit: Payer: Self-pay | Admitting: Internal Medicine

## 2016-04-18 LAB — MICROALBUMIN / CREATININE URINE RATIO
Creatinine, Urine: 413 mg/dL — ABNORMAL HIGH (ref 20–370)
MICROALB/CREAT RATIO: 4 ug/mg{creat} (ref <30)
Microalb, Ur: 1.6 mg/dL

## 2016-04-22 ENCOUNTER — Telehealth: Payer: Self-pay | Admitting: *Deleted

## 2016-04-22 NOTE — Telephone Encounter (Signed)
Patient verified DOB Patient is aware of cholesterol level being high and needing to adhere to cholesterol medication. Patient is aware of needing to increase fiber and exercise and that another cholesterol medication may be added if patients 3/6 month check does not show a significant change. Patient expressed his understanding and had no further questions at this time.

## 2016-04-22 NOTE — Telephone Encounter (Signed)
-----   Message from Quentin Angst, MD sent at 04/22/2016 10:19 AM EST ----- Please inform patient that his cholesterol level is very high. Encourage patient to adhere strictly with cholesterol medication as prescribed and also please limit saturated fat to no more than 7% of your calories, limit cholesterol to 200 mg/day, increase fiber and exercise as tolerated. If needed we may add another cholesterol lowering medication to your regimen.

## 2016-06-18 MED FILL — ATORVASTATIN 40 MG TABLET: 40 | 30 days supply | Qty: 30 | Fill #1

## 2016-06-18 MED FILL — $novoLOG 100 UNITS/ML VIAL: 100 | 30 days supply | Qty: 50 | Fill #0

## 2016-06-18 MED FILL — metFORMIN HCL 1000 MG TABS: 1000 | 30 days supply | Qty: 60 | Fill #1

## 2016-06-18 MED FILL — glipiZIDE 5 MG TABS: 5 | 30 days supply | Qty: 60 | Fill #1

## 2016-06-18 MED FILL — ?CARVEDILOL 6.25 MG TABLET: 6.25 | 30 days supply | Qty: 90 | Fill #1

## 2016-07-11 MED FILL — **ENTRESTO 49-51 MG TABLET: 49-51 | 7 days supply | Qty: 14 | Fill #0

## 2016-07-16 ENCOUNTER — Other Ambulatory Visit: Payer: Self-pay | Admitting: Pharmacist

## 2016-07-16 MED ORDER — INSULIN LISPRO 100 UNIT/ML ~~LOC~~ SOLN: SUBCUTANEOUS | 3 refills | Status: DC

## 2016-07-16 MED FILL — CARVEDILOL 6.25 MG TABLET: 6.25 | 30 days supply | Qty: 90 | Fill #2

## 2016-07-23 ENCOUNTER — Other Ambulatory Visit: Payer: Self-pay | Admitting: Pharmacist

## 2016-07-23 MED ORDER — INSULIN ASPART 100 UNIT/ML ~~LOC~~ SOLN: 10.0000 [IU] | Freq: Three times a day (TID) | SUBCUTANEOUS | 3 refills | Status: DC

## 2016-07-23 MED FILL — **ENTRESTO 49-51 MG TABLET: 49-51 | 7 days supply | Qty: 14 | Fill #1

## 2016-07-31 ENCOUNTER — Ambulatory Visit: Payer: 59 | Attending: Internal Medicine | Admitting: Internal Medicine

## 2016-07-31 ENCOUNTER — Encounter: Payer: Self-pay | Admitting: Internal Medicine

## 2016-07-31 VITALS — BP 108/72 | HR 87 | Temp 98.2°F | Resp 18 | Ht 71.0 in | Wt 191.8 lb

## 2016-07-31 DIAGNOSIS — I110 Hypertensive heart disease with heart failure: Secondary | ICD-10-CM | POA: Diagnosis not present

## 2016-07-31 DIAGNOSIS — Z794 Long term (current) use of insulin: Secondary | ICD-10-CM | POA: Diagnosis not present

## 2016-07-31 DIAGNOSIS — I5022 Chronic systolic (congestive) heart failure: Secondary | ICD-10-CM | POA: Insufficient documentation

## 2016-07-31 DIAGNOSIS — E785 Hyperlipidemia, unspecified: Secondary | ICD-10-CM | POA: Diagnosis not present

## 2016-07-31 DIAGNOSIS — I429 Cardiomyopathy, unspecified: Secondary | ICD-10-CM | POA: Diagnosis not present

## 2016-07-31 DIAGNOSIS — E119 Type 2 diabetes mellitus without complications: Secondary | ICD-10-CM | POA: Diagnosis present

## 2016-07-31 DIAGNOSIS — I428 Other cardiomyopathies: Secondary | ICD-10-CM | POA: Diagnosis not present

## 2016-07-31 LAB — POCT GLYCOSYLATED HEMOGLOBIN (HGB A1C): Hemoglobin A1C: 11.9

## 2016-07-31 LAB — GLUCOSE, POCT (MANUAL RESULT ENTRY): POC GLUCOSE: 152 mg/dL — AB (ref 70–99)

## 2016-07-31 MED ORDER — FISH OIL 1000 MG PO CPDR: 2000.0000 mg | DELAYED_RELEASE_CAPSULE | Freq: Every day | ORAL | 3 refills | Status: DC

## 2016-07-31 MED ORDER — ATORVASTATIN CALCIUM 40 MG PO TABS: 40.0000 mg | ORAL_TABLET | Freq: Every day | ORAL | 3 refills | Status: DC

## 2016-07-31 MED ORDER — METFORMIN HCL 1000 MG PO TABS: ORAL_TABLET | ORAL | 3 refills | Status: DC

## 2016-07-31 MED ORDER — CARVEDILOL 6.25 MG PO TABS: 9.3750 mg | ORAL_TABLET | Freq: Two times a day (BID) | ORAL | 6 refills | Status: DC

## 2016-07-31 MED ORDER — INSULIN ASPART 100 UNIT/ML ~~LOC~~ SOLN: 10.0000 [IU] | Freq: Three times a day (TID) | SUBCUTANEOUS | 3 refills | Status: DC

## 2016-07-31 MED ORDER — GLIPIZIDE 10 MG PO TABS: 10.0000 mg | ORAL_TABLET | Freq: Two times a day (BID) | ORAL | 3 refills | Status: DC

## 2016-07-31 MED ORDER — SACUBITRIL-VALSARTAN 49-51 MG PO TABS: 1.0000 | ORAL_TABLET | Freq: Two times a day (BID) | ORAL | 3 refills | Status: DC

## 2016-07-31 MED FILL — glipiZIDE 10 MG TABS: 10 | 30 days supply | Qty: 60 | Fill #0

## 2016-07-31 MED FILL — ATORVASTATIN 40 MG TABLET: 40 | 30 days supply | Qty: 30 | Fill #0

## 2016-07-31 NOTE — Patient Instructions (Signed)
Diabetes and Foot Care Diabetes may cause you to have problems because of poor blood supply (circulation) to your feet and legs. This may cause the skin on your feet to become thinner, break easier, and heal more slowly. Your skin may become dry, and the skin may peel and crack. You may also have nerve damage in your legs and feet causing decreased feeling in them. You may not notice minor injuries to your feet that could lead to infections or more serious problems. Taking care of your feet is one of the most important things you can do for yourself. Follow these instructions at home:  Wear shoes at all times, even in the house. Do not go barefoot. Bare feet are easily injured.  Check your feet daily for blisters, cuts, and redness. If you cannot see the bottom of your feet, use a mirror or ask someone for help.  Wash your feet with warm water (do not use hot water) and mild soap. Then pat your feet and the areas between your toes until they are completely dry. Do not soak your feet as this can dry your skin.  Apply a moisturizing lotion or petroleum jelly (that does not contain alcohol and is unscented) to the skin on your feet and to dry, brittle toenails. Do not apply lotion between your toes.  Trim your toenails straight across. Do not dig under them or around the cuticle. File the edges of your nails with an emery board or nail file.  Do not cut corns or calluses or try to remove them with medicine.  Wear clean socks or stockings every day. Make sure they are not too tight. Do not wear knee-high stockings since they may decrease blood flow to your legs.  Wear shoes that fit properly and have enough cushioning. To break in new shoes, wear them for just a few hours a day. This prevents you from injuring your feet. Always look in your shoes before you put them on to be sure there are no objects inside.  Do not cross your legs. This may decrease the blood flow to your feet.  If you find a  minor scrape, cut, or break in the skin on your feet, keep it and the skin around it clean and dry. These areas may be cleansed with mild soap and water. Do not cleanse the area with peroxide, alcohol, or iodine.  When you remove an adhesive bandage, be sure not to damage the skin around it.  If you have a wound, look at it several times a day to make sure it is healing.  Do not use heating pads or hot water bottles. They may burn your skin. If you have lost feeling in your feet or legs, you may not know it is happening until it is too late.  Make sure your health care provider performs a complete foot exam at least annually or more often if you have foot problems. Report any cuts, sores, or bruises to your health care provider immediately. Contact a health care provider if:  You have an injury that is not healing.  You have cuts or breaks in the skin.  You have an ingrown nail.  You notice redness on your legs or feet.  You feel burning or tingling in your legs or feet.  You have pain or cramps in your legs and feet.  Your legs or feet are numb.  Your feet always feel cold. Get help right away if:  There is increasing   redness, swelling, or pain in or around a wound.  There is a red line that goes up your leg.  Pus is coming from a wound.  You develop a fever or as directed by your health care provider.  You notice a bad smell coming from an ulcer or wound. This information is not intended to replace advice given to you by your health care provider. Make sure you discuss any questions you have with your health care provider. Document Released: 03/29/2000 Document Revised: 09/07/2015 Document Reviewed: 09/08/2012 Elsevier Interactive Patient Education  2017 [employer] Diabetes Mellitus and Exercise Exercising regularly is important for your overall health, especially when you have diabetes (diabetes mellitus). Exercising is not only about losing weight. It has many health  benefits, such as increasing muscle strength and bone density and reducing body fat and stress. This leads to improved fitness, flexibility, and endurance, all of which result in better overall health. Exercise has additional benefits for people with diabetes, including:  Reducing appetite.  Helping to lower and control blood glucose.  Lowering blood pressure.  Helping to control amounts of fatty substances (lipids) in the blood, such as cholesterol and triglycerides.  Helping the body to respond better to insulin (improving insulin sensitivity).  Reducing how much insulin the body needs.  Decreasing the risk for heart disease by:  Lowering cholesterol and triglyceride levels.  Increasing the levels of good cholesterol.  Lowering blood glucose levels. What is my activity plan? Your health care provider or certified diabetes educator can help you make a plan for the type and frequency of exercise (activity plan) that works for you. Make sure that you:  Do at least 150 minutes of moderate-intensity or vigorous-intensity exercise each week. This could be brisk walking, biking, or water aerobics.  Do stretching and strength exercises, such as yoga or weightlifting, at least 2 times a week.  Spread out your activity over at least 3 days of the week.  Get some form of physical activity every day.  Do not go more than 2 days in a row without some kind of physical activity.  Avoid being inactive for more than 90 minutes at a time. Take frequent breaks to walk or stretch.  Choose a type of exercise or activity that you enjoy, and set realistic goals.  Start slowly, and gradually increase the intensity of your exercise over time. What do I need to know about managing my diabetes?  Check your blood glucose before and after exercising.  If your blood glucose is higher than 240 mg/dL (13.3 mmol/L) before you exercise, check your urine for ketones. If you have ketones in your urine, do  not exercise until your blood glucose returns to normal.  Know the symptoms of low blood glucose (hypoglycemia) and how to treat it. Your risk for hypoglycemia increases during and after exercise. Common symptoms of hypoglycemia can include:  Hunger.  Anxiety.  Sweating and feeling clammy.  Confusion.  Dizziness or feeling light-headed.  Increased heart rate or palpitations.  Blurry vision.  Tingling or numbness around the mouth, lips, or tongue.  Tremors or shakes.  Irritability.  Keep a rapid-acting carbohydrate snack available before, during, and after exercise to help prevent or treat hypoglycemia.  Avoid injecting insulin into areas of the body that are going to be exercised. For example, avoid injecting insulin into:  The arms, when playing tennis.  The legs, when jogging.  Keep records of your exercise habits. Doing this can help you and your health   care provider adjust your diabetes management plan as needed. Write down:  Food that you eat before and after you exercise.  Blood glucose levels before and after you exercise.  The type and amount of exercise you have done.  When your insulin is expected to peak, if you use insulin. Avoid exercising at times when your insulin is peaking.  When you start a new exercise or activity, work with your health care provider to make sure the activity is safe for you, and to adjust your insulin, medicines, or food intake as needed.  Drink plenty of water while you exercise to prevent dehydration or heat stroke. Drink enough fluid to keep your urine clear or pale yellow. This information is not intended to replace advice given to you by your health care provider. Make sure you discuss any questions you have with your health care provider. Document Released: 06/22/2003 Document Revised: 10/20/2015 Document Reviewed: 09/11/2015 Elsevier Interactive Patient Education  2017 [employer] Blood Glucose Monitoring,  Adult Monitoring your blood sugar (glucose) helps you manage your diabetes. It also helps you and your health care provider determine how well your diabetes management plan is working. Blood glucose monitoring involves checking your blood glucose as often as directed, and keeping a record (log) of your results over time. Why should I monitor my blood glucose? Checking your blood glucose regularly can:  Help you understand how food, exercise, illnesses, and medicines affect your blood glucose.  Let you know what your blood glucose is at any time. You can quickly tell if you are having low blood glucose (hypoglycemia) or high blood glucose (hyperglycemia).  Help you and your health care provider adjust your medicines as needed. When should I check my blood glucose? Follow instructions from your health care provider about how often to check your blood glucose. This may depend on:  The type of diabetes you have.  How well-controlled your diabetes is.  Medicines you are taking. If you have type 1 diabetes:   Check your blood glucose at least 2 times a day.  Also check your blood glucose:  Before every insulin injection.  Before and after exercise.  Between meals.  2 hours after a meal.  Occasionally between 2:00 a.m. and 3:00 a.m., as directed.  Before potentially dangerous tasks, like driving or using heavy machinery.  At bedtime.  You may need to check your blood glucose more often, up to 6-10 times a day:  If you use an insulin pump.  If you need multiple daily injections (MDI).  If your diabetes is not well-controlled.  If you are ill.  If you have a history of severe hypoglycemia.  If you have a history of not knowing when your blood glucose is getting low (hypoglycemia unawareness). If you have type 2 diabetes:   If you take insulin or other diabetes medicines, check your blood glucose at least 2 times a day.  If you are on intensive insulin therapy, check your  blood glucose at least 4 times a day. Occasionally, you may also need to check between 2:00 a.m. and 3:00 a.m., as directed.  Also check your blood glucose:  Before and after exercise.  Before potentially dangerous tasks, like driving or using heavy machinery.  You may need to check your blood glucose more often if:  Your medicine is being adjusted.  Your diabetes is not well-controlled.  You are ill. What is a blood glucose log?  A blood glucose log is a record of your blood   glucose readings. It helps you and your health care provider:  Look for patterns in your blood glucose over time.  Adjust your diabetes management plan as needed.  Every time you check your blood glucose, write down your result and notes about things that may be affecting your blood glucose, such as your diet and exercise for the day.  Most glucose meters store a record of glucose readings in the meter. Some meters allow you to download your records to a computer. How do I check my blood glucose? Follow these steps to get accurate readings of your blood glucose: Supplies needed    Blood glucose meter.  Test strips for your meter. Each meter has its own strips. You must use the strips that come with your meter.  A needle to prick your finger (lancet). Do not use lancets more than once.  A device that holds the lancet (lancing device).  A journal or log book to write down your results. Procedure   Wash your hands with soap and water.  Prick the side of your finger (not the tip) with the lancet. Use a different finger each time.  Gently rub the finger until a small drop of blood appears.  Follow instructions that come with your meter for inserting the test strip, applying blood to the strip, and using your blood glucose meter.  Write down your result and any notes. Alternative testing sites   Some meters allow you to use areas of your body other than your finger (alternative sites) to test your  blood.  If you think you may have hypoglycemia, or if you have hypoglycemia unawareness, do not use alternative sites. Use your finger instead.  Alternative sites may not be as accurate as the fingers, because blood flow is slower in these areas. This means that the result you get may be delayed, and it may be different from the result that you would get from your finger.  The most common alternative sites are:  Forearm.  Thigh.  Palm of the hand. Additional tips   Always keep your supplies with you.  If you have questions or need help, all blood glucose meters have a 24-hour "hotline" number that you can call. You may also contact your health care provider.  After you use a few boxes of test strips, adjust (calibrate) your blood glucose meter by following instructions that came with your meter. This information is not intended to replace advice given to you by your health care provider. Make sure you discuss any questions you have with your health care provider. Document Released: 04/04/2003 Document Revised: 10/20/2015 Document Reviewed: 09/11/2015 Elsevier Interactive Patient Education  2017 [employer]  

## 2016-07-31 NOTE — Progress Notes (Signed)
Patient is here for DM FU  Patient denies pain at this time.  Patient has taken medication today. Patient has eaten today.   

## 2016-07-31 NOTE — Progress Notes (Signed)
Jason Stevenson, is a 31 y.o. male  JSE:[id number]  [id number]:[id number]  DOB - 24-Jan-1986  Chief Complaint  Patient presents with  . Diabetes      Subjective:   Jason Stevenson is a 31 y.o. male with history of type 2 diabetes mellitus without complication, hypertension, nonischemic heart disease and hyperlipidemia, extremely nonadherent to medications and dietary advice here today for a follow up visit and medication refill. He has no new complaint today. He eats well, he drinks well, now work at [employer], busy. Denies depression, no suicidal thoughts. He is still non-adherent to medications and/or diet, but he exercises. Patient has No headache, No chest pain, No abdominal pain - No Nausea, No new weakness tingling or numbness, No Cough - SOB.  No problems updated.  ALLERGIES: No Known Allergies  PAST MEDICAL HISTORY: Past Medical History:  Diagnosis Date  . Chronic systolic CHF (congestive heart failure) (HCC) 06/18/2015  . Diabetes mellitus without complication (HCC)   . Hyperlipidemia   . Hypertension   . Nonischemic cardiomyopathy (HCC) Noted as early as 2007   Per chart review (cards consult note 2011), EF of 40% in 2007, down to 20-25% in 2011    MEDICATIONS AT HOME: Prior to Admission medications   Medication Sig Start Date End Date Taking? Authorizing Provider  atorvastatin (LIPITOR) 40 MG tablet Take 1 tablet (40 mg total) by mouth daily at 6 PM. 07/31/16  Yes Deosha Werden E Hyman Hopes, MD  carvedilol (COREG) 6.25 MG tablet Take 1.5 tablets (9.375 mg total) by mouth 2 (two) times daily with a meal. 07/31/16  Yes Iain Sawchuk E Hyman Hopes, MD  glipiZIDE (GLUCOTROL) 10 MG tablet Take 1 tablet (10 mg total) by mouth 2 (two) times daily before a meal. 07/31/16  Yes [name] [name] E Hyman Hopes, MD  insulin aspart (NOVOLOG) 100 UNIT/ML injection Inject 10-50 Units into the skin 3 (three) times daily with meals. 07/31/16  Yes Quentin Angst, MD  Insulin Syringes, Disposable, U-100 1 ML MISC Insulin  syringes 30-gauge 11/20/12  Yes Ripudeep K Rai, MD  metFORMIN (GLUCOPHAGE) 1000 MG tablet TAKE 1 TABLET BY MOUTH 2 TIMES DAILY WITH A MEAL 07/31/16  Yes Junior Huezo Annitta Needs, MD  Omega-3 Fatty Acids (FISH OIL) 1000 MG CPDR Take 2,000 mg by mouth daily. 07/31/16  Yes Emory Gallentine Annitta Needs, MD  sacubitril-valsartan (ENTRESTO) 49-51 MG Take 1 tablet by mouth 2 (two) times daily. 07/31/16  Yes Quentin Angst, MD  nitroGLYCERIN (NITROSTAT) 0.4 MG SL tablet Place 1 tablet (0.4 mg total) under the tongue every 5 (five) minutes as needed for chest pain. Patient not taking: Reported on 04/17/2016 02/20/15   Wendall Stade, MD    Objective:   Vitals:   07/31/16 0919  BP: 108/72  Pulse: 87  Resp: 18  Temp: 98.2 F (36.8 C)  TempSrc: Oral  SpO2: 94%  Weight: 191 lb 12.8 oz (87 kg)  Height: 5\' 11"  (1.803 m)   Exam General appearance : Awake, alert, not in any distress. Speech Clear. Not toxic looking HEENT: Atraumatic and Normocephalic, pupils equally reactive to light and accomodation Neck: Supple, no JVD. No cervical lymphadenopathy.  Chest: Good air entry bilaterally, no added sounds  CVS: S1 S2 regular, no murmurs.  Abdomen: Bowel sounds present, Non tender and not distended with no gaurding, rigidity or rebound. Extremities: B/L Lower Ext shows no edema, both legs are warm to touch Neurology: Awake alert, and oriented X 3, CN II-XII intact, Non focal Skin: No Rash  Data Review Lab  Results  Component Value Date   HGBA1C 11.9 07/31/2016   HGBA1C 11.5 04/17/2016   HGBA1C 14.50 11/10/2014    Assessment & Plan   1. Type 2 diabetes mellitus without complication, with long-term current use of insulin (HCC)  - HgB A1c is up 10 11.9% from 11.5% last visit. Patient still not adherent to medication, diet or exercise. No known factor as barrier - Glucose (CBG)  Continue Metformin and Insulin at current dose, Increase Glipizide to 10 mg po bid from 5 mg - metFORMIN (GLUCOPHAGE) 1000 MG tablet;  TAKE 1 TABLET BY MOUTH 2 TIMES DAILY WITH A MEAL  Dispense: 180 tablet; Refill: 3  - insulin aspart (NOVOLOG) 100 UNIT/ML injection; Inject 10-50 Units into the skin 3 (three) times daily with meals.  Dispense: 3 vial; Refill: 3  - glipiZIDE (GLUCOTROL) 10 MG tablet; Take 1 tablet (10 mg total) by mouth 2 (two) times daily before a meal.  Dispense: 180 tablet; Refill: 3  - Ambulatory referral to Ophthalmology - Ambulatory referral to Podiatry  2. Non-ischemic cardiomyopathy (HCC)  - sacubitril-valsartan (ENTRESTO) 49-51 MG; Take 1 tablet by mouth 2 (two) times daily.  Dispense: 180 tablet; Refill: 3 - carvedilol (COREG) 6.25 MG tablet; Take 1.5 tablets (9.375 mg total) by mouth 2 (two) times daily with a meal.  Dispense: 270 tablet; Refill: 6  3. Dyslipidemia  - Omega-3 Fatty Acids (FISH OIL) 1000 MG CPDR; Take 2,000 mg by mouth daily.  Dispense: 60 capsule; Refill: 3 - atorvastatin (LIPITOR) 40 MG tablet; Take 1 tablet (40 mg total) by mouth daily at 6 PM.  Dispense: 90 tablet; Refill: 3  Patient have been counseled extensively about nutrition and exercise. Other issues discussed during this visit include: low cholesterol diet, weight control and daily exercise, foot care, annual eye examinations at Ophthalmology, importance of adherence with medications and regular follow-up. We also discussed long term complications of uncontrolled diabetes and hypertension.   Return in about 3 months (around 10/30/2016) for Hemoglobin A1C and Follow up, DM, Follow up HTN.  The patient was given clear instructions to go to ER or return to medical center if symptoms don't improve, worsen or new problems develop. The patient verbalized understanding. The patient was told to call to get lab results if they haven't heard anything in the next week.   This note has been created with Education officer, environmental. Any transcriptional errors are unintentional.    Jeanann Lewandowsky, MD, MHA, Maxwell Caul, CPE [facility] and [facility] [place], [place] [phone]   07/31/2016, 9:48 AM

## 2016-08-07 ENCOUNTER — Telehealth: Payer: Self-pay | Admitting: Internal Medicine

## 2016-08-07 NOTE — Telephone Encounter (Signed)
Contacted CoverMyMeds through their website, request for prior auth deleted as patient does not have coverage for insulin.

## 2016-08-07 NOTE — Telephone Encounter (Signed)
Cover my Med Jason Stevenson ) called to find out about the pre-auth request she sent, for insulin aspart (NOVOLOG) 100 UNIT/ML injection  She still waiting for a respond, please contact her at [phone] referent# [id number] thanks

## 2016-09-30 MED FILL — glipiZIDE 10 MG TABS: 10 | 30 days supply | Qty: 60 | Fill #1

## 2016-09-30 MED FILL — ATORVASTATIN 40 MG TABLET: 40 | 30 days supply | Qty: 30 | Fill #1

## 2016-09-30 MED FILL — CARVEDILOL 6.25 MG TABLET: 6.25 | 30 days supply | Qty: 90 | Fill #3

## 2016-11-06 ENCOUNTER — Encounter: Payer: Self-pay | Admitting: Internal Medicine

## 2016-11-06 ENCOUNTER — Ambulatory Visit: Payer: 59 | Attending: Internal Medicine | Admitting: Internal Medicine

## 2016-11-06 ENCOUNTER — Encounter: Payer: Self-pay | Admitting: Pharmacist

## 2016-11-06 VITALS — BP 91/59 | HR 91 | Temp 98.6°F | Resp 18 | Ht 71.0 in | Wt 190.0 lb

## 2016-11-06 DIAGNOSIS — I429 Cardiomyopathy, unspecified: Secondary | ICD-10-CM | POA: Insufficient documentation

## 2016-11-06 DIAGNOSIS — N50812 Left testicular pain: Secondary | ICD-10-CM | POA: Diagnosis not present

## 2016-11-06 DIAGNOSIS — E785 Hyperlipidemia, unspecified: Secondary | ICD-10-CM | POA: Diagnosis not present

## 2016-11-06 DIAGNOSIS — Z79899 Other long term (current) drug therapy: Secondary | ICD-10-CM | POA: Diagnosis not present

## 2016-11-06 DIAGNOSIS — IMO0001 Reserved for inherently not codable concepts without codable children: Secondary | ICD-10-CM

## 2016-11-06 DIAGNOSIS — Z794 Long term (current) use of insulin: Secondary | ICD-10-CM

## 2016-11-06 DIAGNOSIS — Z9114 Patient's other noncompliance with medication regimen: Secondary | ICD-10-CM | POA: Insufficient documentation

## 2016-11-06 DIAGNOSIS — I428 Other cardiomyopathies: Secondary | ICD-10-CM | POA: Diagnosis not present

## 2016-11-06 DIAGNOSIS — I5022 Chronic systolic (congestive) heart failure: Secondary | ICD-10-CM | POA: Insufficient documentation

## 2016-11-06 DIAGNOSIS — I110 Hypertensive heart disease with heart failure: Secondary | ICD-10-CM | POA: Diagnosis not present

## 2016-11-06 DIAGNOSIS — E1165 Type 2 diabetes mellitus with hyperglycemia: Secondary | ICD-10-CM | POA: Diagnosis present

## 2016-11-06 LAB — GLUCOSE, POCT (MANUAL RESULT ENTRY): POC GLUCOSE: 282 mg/dL — AB (ref 70–99)

## 2016-11-06 LAB — POCT GLYCOSYLATED HEMOGLOBIN (HGB A1C): HEMOGLOBIN A1C: 11.6

## 2016-11-06 MED ORDER — METFORMIN HCL 1000 MG PO TABS: ORAL_TABLET | ORAL | 3 refills | Status: DC

## 2016-11-06 MED ORDER — GLIPIZIDE 10 MG PO TABS: 10.0000 mg | ORAL_TABLET | Freq: Two times a day (BID) | ORAL | 3 refills | Status: DC

## 2016-11-06 MED ORDER — SACUBITRIL-VALSARTAN 49-51 MG PO TABS: 1.0000 | ORAL_TABLET | Freq: Two times a day (BID) | ORAL | 3 refills | Status: DC

## 2016-11-06 MED ORDER — FISH OIL 1000 MG PO CPDR: 2000.0000 mg | DELAYED_RELEASE_CAPSULE | Freq: Every day | ORAL | 3 refills | Status: DC

## 2016-11-06 MED ORDER — INSULIN DETEMIR 100 UNIT/ML ~~LOC~~ SOLN: 20.0000 [IU] | Freq: Every day | SUBCUTANEOUS | 11 refills | Status: DC

## 2016-11-06 MED ORDER — INSULIN ASPART 100 UNIT/ML ~~LOC~~ SOLN: 10.0000 [IU] | Freq: Three times a day (TID) | SUBCUTANEOUS | 3 refills | Status: DC

## 2016-11-06 MED ORDER — CARVEDILOL 6.25 MG PO TABS: 9.3750 mg | ORAL_TABLET | Freq: Two times a day (BID) | ORAL | 6 refills | Status: DC

## 2016-11-06 MED ORDER — ATORVASTATIN CALCIUM 40 MG PO TABS: 40.0000 mg | ORAL_TABLET | Freq: Every day | ORAL | 3 refills | Status: DC

## 2016-11-06 MED ORDER — INSULIN GLARGINE 100 UNITS/ML SOLOSTAR PEN: 20.0000 [IU] | PEN_INJECTOR | Freq: Every day | SUBCUTANEOUS | 3 refills | Status: DC

## 2016-11-06 MED ORDER — INSULIN LISPRO 100 UNIT/ML ~~LOC~~ SOLN: 10.0000 [IU] | Freq: Three times a day (TID) | SUBCUTANEOUS | 11 refills | Status: DC

## 2016-11-06 NOTE — Progress Notes (Signed)
Received prior authorization for insulin for patient - patient still with same insurance and does not have injectable therapy coverage and therefore none of the insulins are covered.  Also received prior authorization request for Entresto. Completed and submitted to [employer]

## 2016-11-06 NOTE — Patient Instructions (Signed)
Diabetes Mellitus and Food It is important for you to manage your blood sugar (glucose) level. Your blood glucose level can be greatly affected by what you eat. Eating healthier foods in the appropriate amounts throughout the day at about the same time each day will help you control your blood glucose level. It can also help slow or prevent worsening of your diabetes mellitus. Healthy eating may even help you improve the level of your blood pressure and reach or maintain a healthy weight. General recommendations for healthful eating and cooking habits include:  Eating meals and snacks regularly. Avoid going long periods of time without eating to lose weight.  Eating a diet that consists mainly of plant-based foods, such as fruits, vegetables, nuts, legumes, and whole grains.  Using low-heat cooking methods, such as baking, instead of high-heat cooking methods, such as deep frying.  Work with your dietitian to make sure you understand how to use the Nutrition Facts information on food labels. How can food affect me? Carbohydrates Carbohydrates affect your blood glucose level more than any other type of food. Your dietitian will help you determine how many carbohydrates to eat at each meal and teach you how to count carbohydrates. Counting carbohydrates is important to keep your blood glucose at a healthy level, especially if you are using insulin or taking certain medicines for diabetes mellitus. Alcohol Alcohol can cause sudden decreases in blood glucose (hypoglycemia), especially if you use insulin or take certain medicines for diabetes mellitus. Hypoglycemia can be a life-threatening condition. Symptoms of hypoglycemia (sleepiness, dizziness, and disorientation) are similar to symptoms of having too much alcohol. If your health care provider has given you approval to drink alcohol, do so in moderation and use the following guidelines:  Women should not have more than one drink per day, and men  should not have more than two drinks per day. One drink is equal to: ? 12 oz of beer. ? 5 oz of wine. ? 1 oz of hard liquor.  Do not drink on an empty stomach.  Keep yourself hydrated. Have water, diet soda, or unsweetened iced tea.  Regular soda, juice, and other mixers might contain a lot of carbohydrates and should be counted.  What foods are not recommended? As you make food choices, it is important to remember that all foods are not the same. Some foods have fewer nutrients per serving than other foods, even though they might have the same number of calories or carbohydrates. It is difficult to get your body what it needs when you eat foods with fewer nutrients. Examples of foods that you should avoid that are high in calories and carbohydrates but low in nutrients include:  Trans fats (most processed foods list trans fats on the Nutrition Facts label).  Regular soda.  Juice.  Candy.  Sweets, such as cake, pie, doughnuts, and cookies.  Fried foods.  What foods can I eat? Eat nutrient-rich foods, which will nourish your body and keep you healthy. The food you should eat also will depend on several factors, including:  The calories you need.  The medicines you take.  Your weight.  Your blood glucose level.  Your blood pressure level.  Your cholesterol level.  You should eat a variety of foods, including:  Protein. ? Lean cuts of meat. ? Proteins low in saturated fats, such as fish, egg whites, and beans. Avoid processed meats.  Fruits and vegetables. ? Fruits and vegetables that may help control blood glucose levels, such as apples,   mangoes, and yams.  Dairy products. ? Choose fat-free or low-fat dairy products, such as milk, yogurt, and cheese.  Grains, bread, pasta, and rice. ? Choose whole grain products, such as multigrain bread, whole oats, and brown rice. These foods may help control blood pressure.  Fats. ? Foods containing healthful fats, such as  nuts, avocado, olive oil, canola oil, and fish.  Does everyone with diabetes mellitus have the same meal plan? Because every person with diabetes mellitus is different, there is not one meal plan that works for everyone. It is very important that you meet with a dietitian who will help you create a meal plan that is just right for you. This information is not intended to replace advice given to you by your health care provider. Make sure you discuss any questions you have with your health care provider. Document Released: 12/27/2004 Document Revised: 09/07/2015 Document Reviewed: 02/26/2013 Elsevier Interactive Patient Education  2017 [employer] Diabetes Mellitus and Exercise Exercising regularly is important for your overall health, especially when you have diabetes (diabetes mellitus). Exercising is not only about losing weight. It has many health benefits, such as increasing muscle strength and bone density and reducing body fat and stress. This leads to improved fitness, flexibility, and endurance, all of which result in better overall health. Exercise has additional benefits for people with diabetes, including:  Reducing appetite.  Helping to lower and control blood glucose.  Lowering blood pressure.  Helping to control amounts of fatty substances (lipids) in the blood, such as cholesterol and triglycerides.  Helping the body to respond better to insulin (improving insulin sensitivity).  Reducing how much insulin the body needs.  Decreasing the risk for heart disease by: ? Lowering cholesterol and triglyceride levels. ? Increasing the levels of good cholesterol. ? Lowering blood glucose levels.  What is my activity plan? Your health care provider or certified diabetes educator can help you make a plan for the type and frequency of exercise (activity plan) that works for you. Make sure that you:  Do at least 150 minutes of moderate-intensity or vigorous-intensity exercise each  week. This could be brisk walking, biking, or water aerobics. ? Do stretching and strength exercises, such as yoga or weightlifting, at least 2 times a week. ? Spread out your activity over at least 3 days of the week.  Get some form of physical activity every day. ? Do not go more than 2 days in a row without some kind of physical activity. ? Avoid being inactive for more than 90 minutes at a time. Take frequent breaks to walk or stretch.  Choose a type of exercise or activity that you enjoy, and set realistic goals.  Start slowly, and gradually increase the intensity of your exercise over time.  What do I need to know about managing my diabetes?  Check your blood glucose before and after exercising. ? If your blood glucose is higher than 240 mg/dL (13.3 mmol/L) before you exercise, check your urine for ketones. If you have ketones in your urine, do not exercise until your blood glucose returns to normal.  Know the symptoms of low blood glucose (hypoglycemia) and how to treat it. Your risk for hypoglycemia increases during and after exercise. Common symptoms of hypoglycemia can include: ? Hunger. ? Anxiety. ? Sweating and feeling clammy. ? Confusion. ? Dizziness or feeling light-headed. ? Increased heart rate or palpitations. ? Blurry vision. ? Tingling or numbness around the mouth, lips, or tongue. ? Tremors or shakes. ?   Irritability.  Keep a rapid-acting carbohydrate snack available before, during, and after exercise to help prevent or treat hypoglycemia.  Avoid injecting insulin into areas of the body that are going to be exercised. For example, avoid injecting insulin into: ? The arms, when playing tennis. ? The legs, when jogging.  Keep records of your exercise habits. Doing this can help you and your health care provider adjust your diabetes management plan as needed. Write down: ? Food that you eat before and after you exercise. ? Blood glucose levels before and after you  exercise. ? The type and amount of exercise you have done. ? When your insulin is expected to peak, if you use insulin. Avoid exercising at times when your insulin is peaking.  When you start a new exercise or activity, work with your health care provider to make sure the activity is safe for you, and to adjust your insulin, medicines, or food intake as needed.  Drink plenty of water while you exercise to prevent dehydration or heat stroke. Drink enough fluid to keep your urine clear or pale yellow. This information is not intended to replace advice given to you by your health care provider. Make sure you discuss any questions you have with your health care provider. Document Released: 06/22/2003 Document Revised: 10/20/2015 Document Reviewed: 09/11/2015 Elsevier Interactive Patient Education  2018 [employer]. Varicocele A varicocele is a swelling of veins in the scrotum. The scrotum is the sac that contains the testicles. Varicoceles can occur on either side of the scrotum, but they are more common on the left side. They occur most often in teenage boys and young men. In most cases, varicoceles are not a serious problem. They are usually small and painless and do not require treatment. Tests may be done to confirm the diagnosis. Treatment may be needed if:  A varicocele is large, causes a lot of pain, or causes pain when exercising.  Varicoceles are found on both sides of the scrotum.  The testicle on the opposite side is absent or not normal.  A varicocele causes a decrease in the size of the testicle in a growing adolescent.  The person has fertility problems.  What are the causes? This condition is the result of valves in the veins not working properly. Valves in the veins help to return blood from the scrotum and testicles to the heart. If these valves do not work well, blood flows backward and backs up into the veins, which causes the veins to swell. This is similar to what happens  when varicose veins form in the leg. What are the signs or symptoms? Most varicoceles do not cause any symptoms. If symptoms do occur, they may include:  Swelling on one side of the scrotum. The swelling may be more obvious when you are standing up.  A lumpy feeling in the scrotum.  A heavy feeling on one side of the scrotum.  A dull ache in the scrotum, especially after exercise or prolonged standing or sitting.  Slower growth or reduced size of the testicle on the side of the varicocele (in young males).  Problems with fertility. These can occur if the testicle does not grow normally.  How is this diagnosed? This condition may be diagnosed with a physical exam. You may also have an imaging test, called an ultrasound, to confirm the diagnosis and to help rule out other causes of the swelling. How is this treated? Treatment is usually not needed for this condition. If you have  any pain, your health care provider may prescribe or recommend medicine to help relieve it. You may need regular exams so your health care provider can monitor the varicocele to ensure that it does not cause problems. When further treatment is needed, it may involve one of these options:  Varicocelectomy. This is a surgery in which the swollen veins are tied off so that the flow of blood goes to other veins instead.  Embolization. In this procedure, a small tube (catheter) is used to place metal coils or other blocking items in the veins. This cuts off the blood flow to the swollen veins.  Follow these instructions at home:  Take medicines only as directed by your health care provider.  Wear supportive underwear.  Use an athletic supporter for sports.  Keep all follow-up visits as directed by your health care provider. This is important. Contact a health care provider if:  Your pain is increasing.  You have redness in the affected area.  You have swelling that does not decrease when you are lying  down.  One of your testicles is smaller than the other.  Your testicle becomes enlarged, swollen, or painful. This information is not intended to replace advice given to you by your health care provider. Make sure you discuss any questions you have with your health care provider. Document Released: 07/08/2000 Document Revised: 09/13/2015 Document Reviewed: 03/09/2014 Elsevier Interactive Patient Education  [employer].

## 2016-11-06 NOTE — Progress Notes (Signed)
Patient has eaten  Patient has had medication  Patient CBG 282  Patient A1C is 11.6

## 2016-11-06 NOTE — Progress Notes (Signed)
Jason Stevenson, is a 31 y.o. male  OFB:[id number]  POE:[id number]  DOB - Aug 22, 1985  Chief Complaint  Patient presents with  . Follow-up  . Diabetes  . Hypertension       Subjective:   Jason Stevenson is a 31 y.o. male with history of type 2 diabetes mellitus without complication, hypertension, nonischemic heart disease and hyperlipidemia,extremely nonadherent to medications and dietary advice here today for a routine follow up visit and medication refills. He claims to have changed his lifestyle to better manage his diabetes. He now exercises at least once a week. He has not been taking his medications as prescribed, this is not new. Only takes metformin once daily and occasionally, also uses insulin at most 2 times per day. Not adherent with diet and exercise, but willing. He does not smoke cigarette, he does not drink alcohol. Patient has No headache, No chest pain, No abdominal pain - No Nausea, No new weakness tingling or numbness, No Cough - SOB. He has a concern about a dragging pain in his scrotum and left testicle, but no urethral discharge, no swelling or redness, no discharge. He feels this pain occasionally. No trauma. Not related to sexual activity.  Problem  Pain in Left Testicle    ALLERGIES: No Known Allergies  PAST MEDICAL HISTORY: Past Medical History:  Diagnosis Date  . Chronic systolic CHF (congestive heart failure) (HCC) 06/18/2015  . Diabetes mellitus without complication (HCC)   . Hyperlipidemia   . Hypertension   . Nonischemic cardiomyopathy (HCC) Noted as early as 2007   Per chart review (cards consult note 2011), EF of 40% in 2007, down to 20-25% in 2011    MEDICATIONS AT HOME: Prior to Admission medications   Medication Sig Start Date End Date Taking? Authorizing Provider  carvedilol (COREG) 6.25 MG tablet Take 1.5 tablets (9.375 mg total) by mouth 2 (two) times daily with a meal. 11/06/16  Yes Treva Huyett E, MD  glipiZIDE (GLUCOTROL) 10  MG tablet Take 1 tablet (10 mg total) by mouth 2 (two) times daily before a meal. 11/06/16  Yes Noah Pelaez E, MD  insulin aspart (NOVOLOG) 100 UNIT/ML injection Inject 10-20 Units into the skin 3 (three) times daily with meals. 11/06/16  Yes Quentin Angst, MD  Insulin Syringes, Disposable, U-100 1 ML MISC Insulin syringes 30-gauge 11/20/12  Yes Rai, Ripudeep K, MD  metFORMIN (GLUCOPHAGE) 1000 MG tablet TAKE 1 TABLET BY MOUTH 2 TIMES DAILY WITH A MEAL 11/06/16  Yes Angelly Spearing E, MD  Omega-3 Fatty Acids (FISH OIL) 1000 MG CPDR Take 2,000 mg by mouth daily. 11/06/16  Yes Anzley Dibbern, Phylliss Blakes, MD  sacubitril-valsartan (ENTRESTO) 49-51 MG Take 1 tablet by mouth 2 (two) times daily. 11/06/16  Yes Quentin Angst, MD  atorvastatin (LIPITOR) 40 MG tablet Take 1 tablet (40 mg total) by mouth daily at 6 PM. 11/06/16   Johannes Everage E, MD  insulin glargine (LANTUS) 100 unit/mL SOPN Inject 0.2 mLs (20 Units total) into the skin at bedtime. 11/06/16   Quentin Angst, MD  nitroGLYCERIN (NITROSTAT) 0.4 MG SL tablet Place 1 tablet (0.4 mg total) under the tongue every 5 (five) minutes as needed for chest pain. Patient not taking: Reported on 04/17/2016 02/20/15   Wendall Stade, MD    Objective:   Vitals:   11/06/16 0919  BP: (!) 91/59  Pulse: 91  Resp: 18  Temp: 98.6 F (37 C)  TempSrc: Oral  SpO2: 96%  Weight: 190 lb (86.2  kg)  Height: 5\' 11"  (1.803 m)   Exam General appearance : Awake, alert, not in any distress. Speech Clear. Not toxic looking HEENT: Atraumatic and Normocephalic, pupils equally reactive to light and accomodation Neck: Supple, no JVD. No cervical lymphadenopathy.  Chest: Good air entry bilaterally, no added sounds  CVS: S1 S2 regular, no murmurs.  Abdomen: Bowel sounds present, Non tender and not distended with no gaurding, rigidity or rebound. Left testicle slightly high riding, mildly tender, smallish, feels some distended veins. Extremities: B/L  Lower Ext shows no edema, both legs are warm to touch Neurology: Awake alert, and oriented X 3, CN II-XII intact, Non focal Skin: No Rash  Data Review Lab Results  Component Value Date   HGBA1C 11.6 11/06/2016   HGBA1C 11.9 07/31/2016   HGBA1C 11.5 04/17/2016    Assessment & Plan   1. Uncontrolled type 2 diabetes mellitus without complication, with long-term current use of insulin (HCC)  - POCT A1C - Glucose (CBG)  Please continue these medications and take as prescribed, - glipiZIDE (GLUCOTROL) 10 MG tablet; Take 1 tablet (10 mg total) by mouth 2 (two) times daily before a meal.  Dispense: 180 tablet; Refill: 3 - metFORMIN (GLUCOPHAGE) 1000 MG tablet; TAKE 1 TABLET BY MOUTH 2 TIMES DAILY WITH A MEAL  Dispense: 180 tablet; Refill: 3  Start - insulin glargine (LANTUS) 100 unit/mL SOPN; Inject 0.2 mLs (20 Units total) into the skin at bedtime.  Dispense: 15 mL; Refill: 3 - insulin aspart (NOVOLOG) 100 UNIT/ML injection; Inject 10-20 Units into the skin 3 (three) times daily with meals.  Dispense: 3 vial; Refill: 3  2. Dyslipidemia  - atorvastatin (LIPITOR) 40 MG tablet; Take 1 tablet (40 mg total) by mouth daily at 6 PM.  Dispense: 90 tablet; Refill: 3 - Omega-3 Fatty Acids (FISH OIL) 1000 MG CPDR; Take 2,000 mg by mouth daily.  Dispense: 60 capsule; Refill: 3 - Lipid panel  3. Pain in left testicle  Most likely from Varicocele, no sign of infection or torsion. No Urethral discharge, no mass palpated.  4. Non-ischemic cardiomyopathy (HCC)  - carvedilol (COREG) 6.25 MG tablet; Take 1.5 tablets (9.375 mg total) by mouth 2 (two) times daily with a meal.  Dispense: 270 tablet; Refill: 6 - sacubitril-valsartan (ENTRESTO) 49-51 MG; Take 1 tablet by mouth 2 (two) times daily.  Dispense: 180 tablet; Refill: 3  Patient have been counseled extensively about nutrition and exercise. Other issues discussed during this visit include: low cholesterol diet, weight control and daily exercise,  foot care, annual eye examinations at Ophthalmology, importance of adherence with medications and regular follow-up. We also discussed long term complications of uncontrolled diabetes and hypertension.   Return in about 3 months (around 02/06/2017) for Hemoglobin A1C and Follow up, DM, Follow up HTN, Follow up Pain and comorbidities.  The patient was given clear instructions to go to ER or return to medical center if symptoms don't improve, worsen or new problems develop. The patient verbalized understanding. The patient was told to call to get lab results if they haven't heard anything in the next week.   This note has been created with Education officer, environmental. Any transcriptional errors are unintentional.    Jeanann Lewandowsky, MD, MHA, FACP, FAAP, CPE [facility] and [facility] [place], [place] [phone]   11/06/2016, 10:14 AM

## 2016-11-07 LAB — LIPID PANEL
CHOLESTEROL TOTAL: 212 mg/dL — AB (ref 100–199)
Chol/HDL Ratio: 4.9 ratio (ref 0.0–5.0)
HDL: 43 mg/dL (ref >39)
LDL CALC: 141 mg/dL — AB (ref 0–99)
TRIGLYCERIDES: 141 mg/dL (ref 0–149)
VLDL CHOLESTEROL CAL: 28 mg/dL (ref 5–40)

## 2016-11-12 ENCOUNTER — Encounter: Payer: Self-pay | Admitting: Internal Medicine

## 2016-11-28 NOTE — Telephone Encounter (Signed)
-----   Message from Quentin Angst, MD sent at 11/12/2016  7:49 PM EDT ----- Cholesterol level has improved, please continue your cholesterol medications and also please limit saturated fat to no more than 7% of your calories, limit cholesterol to 200 mg/day, increase fiber and exercise as tolerated. If needed we may add another cholesterol lowering medication to your regimen.

## 2016-12-10 MED FILL — CARVEDILOL 6.25 MG TABLET: 6.25 | 30 days supply | Qty: 90 | Fill #0

## 2017-02-05 ENCOUNTER — Encounter: Payer: Self-pay | Admitting: Internal Medicine

## 2017-02-05 ENCOUNTER — Ambulatory Visit: Payer: 59 | Attending: Internal Medicine | Admitting: Internal Medicine

## 2017-02-05 VITALS — BP 118/82 | HR 82 | Temp 98.3°F | Resp 18 | Ht 73.0 in | Wt 180.0 lb

## 2017-02-05 DIAGNOSIS — I5022 Chronic systolic (congestive) heart failure: Secondary | ICD-10-CM | POA: Diagnosis not present

## 2017-02-05 DIAGNOSIS — Z794 Long term (current) use of insulin: Secondary | ICD-10-CM | POA: Diagnosis not present

## 2017-02-05 DIAGNOSIS — I110 Hypertensive heart disease with heart failure: Secondary | ICD-10-CM | POA: Insufficient documentation

## 2017-02-05 DIAGNOSIS — I429 Cardiomyopathy, unspecified: Secondary | ICD-10-CM | POA: Diagnosis not present

## 2017-02-05 DIAGNOSIS — Z79899 Other long term (current) drug therapy: Secondary | ICD-10-CM | POA: Diagnosis not present

## 2017-02-05 DIAGNOSIS — E1165 Type 2 diabetes mellitus with hyperglycemia: Secondary | ICD-10-CM | POA: Diagnosis not present

## 2017-02-05 DIAGNOSIS — I428 Other cardiomyopathies: Secondary | ICD-10-CM

## 2017-02-05 DIAGNOSIS — E785 Hyperlipidemia, unspecified: Secondary | ICD-10-CM | POA: Diagnosis not present

## 2017-02-05 DIAGNOSIS — IMO0001 Reserved for inherently not codable concepts without codable children: Secondary | ICD-10-CM

## 2017-02-05 LAB — POCT GLYCOSYLATED HEMOGLOBIN (HGB A1C): Hemoglobin A1C: 13

## 2017-02-05 LAB — GLUCOSE, POCT (MANUAL RESULT ENTRY): POC Glucose: 244 mg/dl — AB (ref 70–99)

## 2017-02-05 MED ORDER — INSULIN DETEMIR 100 UNIT/ML ~~LOC~~ SOLN: 20.0000 [IU] | Freq: Every day | SUBCUTANEOUS | 11 refills | Status: DC

## 2017-02-05 MED ORDER — GLIPIZIDE 10 MG PO TABS: 10.0000 mg | ORAL_TABLET | Freq: Two times a day (BID) | ORAL | 3 refills | Status: DC

## 2017-02-05 MED ORDER — FISH OIL 1000 MG PO CPDR: 2000.0000 mg | DELAYED_RELEASE_CAPSULE | Freq: Every day | ORAL | 3 refills | Status: DC

## 2017-02-05 MED ORDER — INSULIN ASPART 100 UNIT/ML ~~LOC~~ SOLN: 10.0000 [IU] | Freq: Three times a day (TID) | SUBCUTANEOUS | 3 refills | Status: DC

## 2017-02-05 MED ORDER — CARVEDILOL 6.25 MG PO TABS: 9.3750 mg | ORAL_TABLET | Freq: Two times a day (BID) | ORAL | 6 refills | Status: DC

## 2017-02-05 MED ORDER — ATORVASTATIN CALCIUM 40 MG PO TABS: 40.0000 mg | ORAL_TABLET | Freq: Every day | ORAL | 3 refills | Status: DC

## 2017-02-05 MED ORDER — SACUBITRIL-VALSARTAN 49-51 MG PO TABS: 1.0000 | ORAL_TABLET | Freq: Two times a day (BID) | ORAL | 3 refills | Status: DC

## 2017-02-05 MED ORDER — METFORMIN HCL 1000 MG PO TABS: ORAL_TABLET | ORAL | 3 refills | Status: DC

## 2017-02-05 MED FILL — metFORMIN HCL 1000 MG TABS: 1000 | 30 days supply | Qty: 60 | Fill #0

## 2017-02-05 MED FILL — !LEVEMIR 100 UNITS/ML VIAL: 100/ML | 28 days supply | Qty: 10 | Fill #0

## 2017-02-05 MED FILL — CARVEDILOL 6.25 MG TABLET: 6.25 | 30 days supply | Qty: 90 | Fill #0

## 2017-02-05 MED FILL — !NOVOLOG 100UNITS/ML VIAL: 100/ML | 28 days supply | Qty: 20 | Fill #0

## 2017-02-05 MED FILL — ATORVASTATIN 40 MG TABLET: 40 | 30 days supply | Qty: 30 | Fill #0

## 2017-02-05 MED FILL — glipiZIDE 10 MG TABS: 10 | 30 days supply | Qty: 60 | Fill #0

## 2017-02-05 NOTE — Progress Notes (Signed)
Jason Stevenson, is a 31 y.o. male  JAS:[id number]  ALP:[id number]  DOB - 01/29/1986  Chief Complaint  Patient presents with  . Diabetes       Subjective:   Jason Stevenson is a 31 y.o. male with history of type 2 diabetes mellitus without complication, hypertension, nonischemic heart disease and hyperlipidemia who presents here today for a follow up visit. Extremely non-adherent, has not refilled insulin in over 3 months, his insurance does not cover any form of insulin. He has no new complaint today. He needs refill of his medications. Blood sugar remained uncontrolled and A1C is up to 13.0% from 11.6%  3 months ago. He does not follow any lifestyle modifications despite repeated counseling. I had suggested going back to nutritional class but he declined saying he already went and knows what to do. Patient has No headache, No chest pain, No abdominal pain - No Nausea, No new weakness tingling or numbness, No Cough - SOB.  Problem  Uncontrolled Type 2 Diabetes Mellitus Without Complication, With Long-Term Current Use of Insulin (Hcc)    ALLERGIES: No Known Allergies  PAST MEDICAL HISTORY: Past Medical History:  Diagnosis Date  . Chronic systolic CHF (congestive heart failure) (HCC) 06/18/2015  . Diabetes mellitus without complication (HCC)   . Hyperlipidemia   . Hypertension   . Nonischemic cardiomyopathy (HCC) Noted as early as 2007   Per chart review (cards consult note 2011), EF of 40% in 2007, down to 20-25% in 2011    MEDICATIONS AT HOME: Prior to Admission medications   Medication Sig Start Date End Date Taking? Authorizing Provider  atorvastatin (LIPITOR) 40 MG tablet Take 1 tablet (40 mg total) by mouth daily at 6 PM. 02/05/17  Yes Kessler Solly E, MD  carvedilol (COREG) 6.25 MG tablet Take 1.5 tablets (9.375 mg total) by mouth 2 (two) times daily with a meal. 02/05/17  Yes Glenyce Randle E, MD  glipiZIDE (GLUCOTROL) 10 MG tablet Take 1 tablet (10 mg total)  by mouth 2 (two) times daily before a meal. 02/05/17  Yes Francyne Arreaga E, MD  insulin aspart (NOVOLOG) 100 UNIT/ML injection Inject 10-20 Units into the skin 3 (three) times daily with meals. 02/05/17  Yes Meisha Salone E, MD  insulin detemir (LEVEMIR) 100 UNIT/ML injection Inject 0.2 mLs (20 Units total) into the skin at bedtime. 02/05/17  Yes Quentin Angst, MD  Insulin Syringes, Disposable, U-100 1 ML MISC Insulin syringes 30-gauge 11/20/12  Yes Rai, Ripudeep K, MD  metFORMIN (GLUCOPHAGE) 1000 MG tablet TAKE 1 TABLET BY MOUTH 2 TIMES DAILY WITH A MEAL 02/05/17  Yes Lochlann Mastrangelo E, MD  Omega-3 Fatty Acids (FISH OIL) 1000 MG CPDR Take 2,000 mg by mouth daily. 02/05/17  Yes Donaldo Teegarden, Phylliss Blakes, MD  sacubitril-valsartan (ENTRESTO) 49-51 MG Take 1 tablet by mouth 2 (two) times daily. 02/05/17  Yes Quentin Angst, MD  nitroGLYCERIN (NITROSTAT) 0.4 MG SL tablet Place 1 tablet (0.4 mg total) under the tongue every 5 (five) minutes as needed for chest pain. Patient not taking: Reported on 04/17/2016 02/20/15   Wendall Stade, MD    Objective:   Vitals:   02/05/17 1038  BP: 118/82  Pulse: 82  Resp: 18  Temp: 98.3 F (36.8 C)  TempSrc: Oral  SpO2: 98%  Weight: 180 lb (81.6 kg)  Height: 6\' 1"  (1.854 m)   Exam General appearance : Awake, alert, not in any distress. Speech Clear. Not toxic looking HEENT: Atraumatic and Normocephalic, pupils equally reactive  to light and accomodation Neck: Supple, no JVD. No cervical lymphadenopathy.  Chest: Good air entry bilaterally, no added sounds  CVS: S1 S2 regular, no murmurs.  Abdomen: Bowel sounds present, Non tender and not distended with no gaurding, rigidity or rebound. Extremities: B/L Lower Ext shows no edema, both legs are warm to touch Neurology: Awake alert, and oriented X 3, CN II-XII intact, Non focal Skin: No Rash  Data Review Lab Results  Component Value Date   HGBA1C 13.0 02/05/2017   HGBA1C 11.6 11/06/2016     HGBA1C 11.9 07/31/2016    Assessment & Plan   1. Uncontrolled type 2 diabetes mellitus without complication, with long-term current use of insulin (HCC)  - POCT A1C - Glucose (CBG) - glipiZIDE (GLUCOTROL) 10 MG tablet; Take 1 tablet (10 mg total) by mouth 2 (two) times daily before a meal.  Dispense: 180 tablet; Refill: 3 - insulin aspart (NOVOLOG) 100 UNIT/ML injection; Inject 10-20 Units into the skin 3 (three) times daily with meals.  Dispense: 3 vial; Refill: 3 - metFORMIN (GLUCOPHAGE) 1000 MG tablet; TAKE 1 TABLET BY MOUTH 2 TIMES DAILY WITH A MEAL  Dispense: 180 tablet; Refill: 3  2. Dyslipidemia  - atorvastatin (LIPITOR) 40 MG tablet; Take 1 tablet (40 mg total) by mouth daily at 6 PM.  Dispense: 90 tablet; Refill: 3 - Omega-3 Fatty Acids (FISH OIL) 1000 MG CPDR; Take 2,000 mg by mouth daily.  Dispense: 60 capsule; Refill: 3  3. Non-ischemic cardiomyopathy (HCC)  - carvedilol (COREG) 6.25 MG tablet; Take 1.5 tablets (9.375 mg total) by mouth 2 (two) times daily with a meal.  Dispense: 270 tablet; Refill: 6 - sacubitril-valsartan (ENTRESTO) 49-51 MG; Take 1 tablet by mouth 2 (two) times daily.  Dispense: 180 tablet; Refill: 3  Patient have been counseled extensively about nutrition and exercise. Other issues discussed during this visit include: low cholesterol diet, weight control and daily exercise, foot care, annual eye examinations at Ophthalmology, importance of adherence with medications and regular follow-up. We also discussed long term complications of uncontrolled diabetes and hypertension.   Return in about 3 months (around 05/08/2017) for Hemoglobin A1C and Follow up, DM, Follow up HTN.  The patient was given clear instructions to go to ER or return to medical center if symptoms don't improve, worsen or new problems develop. The patient verbalized understanding. The patient was told to call to get lab results if they haven't heard anything in the next week.   This note  has been created with Education officer, environmentalDragon speech recognition software and smart phrase technology. Any transcriptional errors are unintentional.    Jeanann LewandowskyJEGEDE, Daril Warga, MD, MHA, Maxwell CaulFACP, FAAP, CPE [facility] and [facility] [place], [place] [phone]   02/05/2017, 10:55 AM

## 2017-02-05 NOTE — Patient Instructions (Signed)
Diabetes Mellitus and Food It is important for you to manage your blood sugar (glucose) level. Your blood glucose level can be greatly affected by what you eat. Eating healthier foods in the appropriate amounts throughout the day at about the same time each day will help you control your blood glucose level. It can also help slow or prevent worsening of your diabetes mellitus. Healthy eating may even help you improve the level of your blood pressure and reach or maintain a healthy weight. General recommendations for healthful eating and cooking habits include:  Eating meals and snacks regularly. Avoid going long periods of time without eating to lose weight.  Eating a diet that consists mainly of plant-based foods, such as fruits, vegetables, nuts, legumes, and whole grains.  Using low-heat cooking methods, such as baking, instead of high-heat cooking methods, such as deep frying.  Work with your dietitian to make sure you understand how to use the Nutrition Facts information on food labels. How can food affect me? Carbohydrates Carbohydrates affect your blood glucose level more than any other type of food. Your dietitian will help you determine how many carbohydrates to eat at each meal and teach you how to count carbohydrates. Counting carbohydrates is important to keep your blood glucose at a healthy level, especially if you are using insulin or taking certain medicines for diabetes mellitus. Alcohol Alcohol can cause sudden decreases in blood glucose (hypoglycemia), especially if you use insulin or take certain medicines for diabetes mellitus. Hypoglycemia can be a life-threatening condition. Symptoms of hypoglycemia (sleepiness, dizziness, and disorientation) are similar to symptoms of having too much alcohol. If your health care provider has given you approval to drink alcohol, do so in moderation and use the following guidelines:  Women should not have more than one drink per day, and men  should not have more than two drinks per day. One drink is equal to: ? 12 oz of beer. ? 5 oz of wine. ? 1 oz of hard liquor.  Do not drink on an empty stomach.  Keep yourself hydrated. Have water, diet soda, or unsweetened iced tea.  Regular soda, juice, and other mixers might contain a lot of carbohydrates and should be counted.  What foods are not recommended? As you make food choices, it is important to remember that all foods are not the same. Some foods have fewer nutrients per serving than other foods, even though they might have the same number of calories or carbohydrates. It is difficult to get your body what it needs when you eat foods with fewer nutrients. Examples of foods that you should avoid that are high in calories and carbohydrates but low in nutrients include:  Trans fats (most processed foods list trans fats on the Nutrition Facts label).  Regular soda.  Juice.  Candy.  Sweets, such as cake, pie, doughnuts, and cookies.  Fried foods.  What foods can I eat? Eat nutrient-rich foods, which will nourish your body and keep you healthy. The food you should eat also will depend on several factors, including:  The calories you need.  The medicines you take.  Your weight.  Your blood glucose level.  Your blood pressure level.  Your cholesterol level.  You should eat a variety of foods, including:  Protein. ? Lean cuts of meat. ? Proteins low in saturated fats, such as fish, egg whites, and beans. Avoid processed meats.  Fruits and vegetables. ? Fruits and vegetables that may help control blood glucose levels, such as apples,   mangoes, and yams.  Dairy products. ? Choose fat-free or low-fat dairy products, such as milk, yogurt, and cheese.  Grains, bread, pasta, and rice. ? Choose whole grain products, such as multigrain bread, whole oats, and brown rice. These foods may help control blood pressure.  Fats. ? Foods containing healthful fats, such as  nuts, avocado, olive oil, canola oil, and fish.  Does everyone with diabetes mellitus have the same meal plan? Because every person with diabetes mellitus is different, there is not one meal plan that works for everyone. It is very important that you meet with a dietitian who will help you create a meal plan that is just right for you. This information is not intended to replace advice given to you by your health care provider. Make sure you discuss any questions you have with your health care provider. Document Released: 12/27/2004 Document Revised: 09/07/2015 Document Reviewed: 02/26/2013 Elsevier Interactive Patient Education  2017 Elsevier Inc. Diabetes Mellitus and Exercise Exercising regularly is important for your overall health, especially when you have diabetes (diabetes mellitus). Exercising is not only about losing weight. It has many health benefits, such as increasing muscle strength and bone density and reducing body fat and stress. This leads to improved fitness, flexibility, and endurance, all of which result in better overall health. Exercise has additional benefits for people with diabetes, including:  Reducing appetite.  Helping to lower and control blood glucose.  Lowering blood pressure.  Helping to control amounts of fatty substances (lipids) in the blood, such as cholesterol and triglycerides.  Helping the body to respond better to insulin (improving insulin sensitivity).  Reducing how much insulin the body needs.  Decreasing the risk for heart disease by: ? Lowering cholesterol and triglyceride levels. ? Increasing the levels of good cholesterol. ? Lowering blood glucose levels.  What is my activity plan? Your health care provider or certified diabetes educator can help you make a plan for the type and frequency of exercise (activity plan) that works for you. Make sure that you:  Do at least 150 minutes of moderate-intensity or vigorous-intensity exercise each  week. This could be brisk walking, biking, or water aerobics. ? Do stretching and strength exercises, such as yoga or weightlifting, at least 2 times a week. ? Spread out your activity over at least 3 days of the week.  Get some form of physical activity every day. ? Do not go more than 2 days in a row without some kind of physical activity. ? Avoid being inactive for more than 90 minutes at a time. Take frequent breaks to walk or stretch.  Choose a type of exercise or activity that you enjoy, and set realistic goals.  Start slowly, and gradually increase the intensity of your exercise over time.  What do I need to know about managing my diabetes?  Check your blood glucose before and after exercising. ? If your blood glucose is higher than 240 mg/dL (13.3 mmol/L) before you exercise, check your urine for ketones. If you have ketones in your urine, do not exercise until your blood glucose returns to normal.  Know the symptoms of low blood glucose (hypoglycemia) and how to treat it. Your risk for hypoglycemia increases during and after exercise. Common symptoms of hypoglycemia can include: ? Hunger. ? Anxiety. ? Sweating and feeling clammy. ? Confusion. ? Dizziness or feeling light-headed. ? Increased heart rate or palpitations. ? Blurry vision. ? Tingling or numbness around the mouth, lips, or tongue. ? Tremors or shakes. ?   Irritability.  Keep a rapid-acting carbohydrate snack available before, during, and after exercise to help prevent or treat hypoglycemia.  Avoid injecting insulin into areas of the body that are going to be exercised. For example, avoid injecting insulin into: ? The arms, when playing tennis. ? The legs, when jogging.  Keep records of your exercise habits. Doing this can help you and your health care provider adjust your diabetes management plan as needed. Write down: ? Food that you eat before and after you exercise. ? Blood glucose levels before and after you  exercise. ? The type and amount of exercise you have done. ? When your insulin is expected to peak, if you use insulin. Avoid exercising at times when your insulin is peaking.  When you start a new exercise or activity, work with your health care provider to make sure the activity is safe for you, and to adjust your insulin, medicines, or food intake as needed.  Drink plenty of water while you exercise to prevent dehydration or heat stroke. Drink enough fluid to keep your urine clear or pale yellow. This information is not intended to replace advice given to you by your health care provider. Make sure you discuss any questions you have with your health care provider. Document Released: 06/22/2003 Document Revised: 10/20/2015 Document Reviewed: 09/11/2015 Elsevier Interactive Patient Education  2018 Elsevier Inc.  

## 2017-03-04 ENCOUNTER — Encounter (HOSPITAL_COMMUNITY): Payer: Self-pay

## 2017-03-04 ENCOUNTER — Other Ambulatory Visit: Payer: Self-pay | Admitting: *Deleted

## 2017-03-04 DIAGNOSIS — Z794 Long term (current) use of insulin: Secondary | ICD-10-CM | POA: Diagnosis not present

## 2017-03-04 DIAGNOSIS — Z79899 Other long term (current) drug therapy: Secondary | ICD-10-CM | POA: Insufficient documentation

## 2017-03-04 DIAGNOSIS — K2900 Acute gastritis without bleeding: Secondary | ICD-10-CM | POA: Insufficient documentation

## 2017-03-04 DIAGNOSIS — I5022 Chronic systolic (congestive) heart failure: Secondary | ICD-10-CM | POA: Insufficient documentation

## 2017-03-04 DIAGNOSIS — E119 Type 2 diabetes mellitus without complications: Secondary | ICD-10-CM | POA: Diagnosis not present

## 2017-03-04 DIAGNOSIS — I110 Hypertensive heart disease with heart failure: Secondary | ICD-10-CM | POA: Diagnosis not present

## 2017-03-04 DIAGNOSIS — R1010 Upper abdominal pain, unspecified: Secondary | ICD-10-CM | POA: Diagnosis present

## 2017-03-04 LAB — COMPREHENSIVE METABOLIC PANEL
ALK PHOS: 63 U/L (ref 38–126)
ALT: 24 U/L (ref 17–63)
ANION GAP: 6 (ref 5–15)
AST: 27 U/L (ref 15–41)
Albumin: 3.8 g/dL (ref 3.5–5.0)
BILIRUBIN TOTAL: 0.8 mg/dL (ref 0.3–1.2)
BUN: 15 mg/dL (ref 6–20)
CALCIUM: 9.2 mg/dL (ref 8.9–10.3)
CO2: 28 mmol/L (ref 22–32)
Chloride: 105 mmol/L (ref 101–111)
Creatinine, Ser: 0.84 mg/dL (ref 0.61–1.24)
GLUCOSE: 81 mg/dL (ref 65–99)
POTASSIUM: 3.4 mmol/L — AB (ref 3.5–5.1)
Sodium: 139 mmol/L (ref 135–145)
TOTAL PROTEIN: 7.1 g/dL (ref 6.5–8.1)

## 2017-03-04 LAB — URINALYSIS, ROUTINE W REFLEX MICROSCOPIC
Bilirubin Urine: NEGATIVE
GLUCOSE, UA: NEGATIVE mg/dL
HGB URINE DIPSTICK: NEGATIVE
Ketones, ur: NEGATIVE mg/dL
Leukocytes, UA: NEGATIVE
Nitrite: NEGATIVE
PH: 6 (ref 5.0–8.0)
Protein, ur: NEGATIVE mg/dL
SPECIFIC GRAVITY, URINE: 1.031 — AB (ref 1.005–1.030)

## 2017-03-04 LAB — CBC
HCT: 41.7 % (ref 39.0–52.0)
HEMOGLOBIN: 14.2 g/dL (ref 13.0–17.0)
MCH: 29 pg (ref 26.0–34.0)
MCHC: 34.1 g/dL (ref 30.0–36.0)
MCV: 85.1 fL (ref 78.0–100.0)
Platelets: 262 10*3/uL (ref 150–400)
RBC: 4.9 MIL/uL (ref 4.22–5.81)
RDW: 12 % (ref 11.5–15.5)
WBC: 8.1 10*3/uL (ref 4.0–10.5)

## 2017-03-04 LAB — LIPASE, BLOOD: Lipase: 24 U/L (ref 11–51)

## 2017-03-04 MED ORDER — INSULIN DETEMIR 100 UNIT/ML ~~LOC~~ SOLN: 20.0000 [IU] | Freq: Every day | SUBCUTANEOUS | 3 refills | Status: DC

## 2017-03-04 NOTE — Telephone Encounter (Signed)
PRINTED FOR PASS PROGRAM 

## 2017-03-04 NOTE — ED Triage Notes (Signed)
Pt states that for the past five days he has been having upper abd pain. Denies n/v. Reports some constipation, last BM today

## 2017-03-05 ENCOUNTER — Emergency Department (HOSPITAL_COMMUNITY)
Admission: EM | Admit: 2017-03-05 | Discharge: 2017-03-05 | Disposition: A | Discharge status: Home / Self Care | Payer: 59 | Attending: Emergency Medicine | Admitting: Emergency Medicine

## 2017-03-05 DIAGNOSIS — K2900 Acute gastritis without bleeding: Secondary | ICD-10-CM

## 2017-03-05 MED ORDER — PANTOPRAZOLE SODIUM 20 MG PO TBEC: 20.0000 mg | DELAYED_RELEASE_TABLET | Freq: Two times a day (BID) | ORAL | 0 refills | Status: DC

## 2017-03-05 MED ORDER — GI COCKTAIL ~~LOC~~
30.0000 mL | Freq: Once | ORAL | Status: AC
  Administered 2017-03-05: 30 mL via ORAL
  Filled 2017-03-05: qty 30

## 2017-03-05 MED ORDER — PANTOPRAZOLE SODIUM 40 MG PO TBEC
40.0000 mg | DELAYED_RELEASE_TABLET | Freq: Once | ORAL | Status: AC
  Administered 2017-03-05: 40 mg via ORAL
  Filled 2017-03-05: qty 1

## 2017-03-05 NOTE — Discharge Instructions (Signed)
It was my pleasure taking care of you today!   Fortunately, your lab work was very reassuring.   Take Protonix as directed. Avoid Aleve, ibuprofen, naproxen. You can take Tylenol if you need over-the-counter pain relief.   If symptoms do not improve, please follow up with your primary care doctor.   It is VERY important that you monitor your symptoms and return to the Emergency Department if you develop any of the following symptoms:  You have a fever.  You keep throwing up and can't keep fluids down. You pass bloody or black tarry stools.  There is bright red blood in the stool. You do not seem to be getting better.  You have any questions or concerns.

## 2017-03-05 NOTE — ED Provider Notes (Signed)
[facility] EMERGENCY DEPARTMENT Provider Note   CSN: [id number] Arrival date & time: 03/04/17  2250     History   Chief Complaint Chief Complaint  Patient presents with  . Abdominal Pain    HPI Jason Stevenson is a 31 y.o. male.  The history is provided by the patient and medical records. No language interpreter was used.    Jason Stevenson is a 31 y.o. male  with a PMH of DM, HTN, CHF, HLD who presents to the Emergency Department complaining of upper abdominal pain x 5 days.  Pain comes and goes, feels like a burning aching.  It has been occurring more frequently over the last 2-3 days.  No history of similar.  No medications taken prior to arrival for symptoms.  He notes that he had a cold about a week ago and was taking 4 Aleve at a time about 3 times a day, maybe more.  Denies alleviating or aggravating factors.  No chest pain, shortness of breath, blood in the stool, diarrhea, nausea, vomiting.    Past Medical History:  Diagnosis Date  . Chronic systolic CHF (congestive heart failure) (HCC) 06/18/2015  . Diabetes mellitus without complication (HCC)   . Hyperlipidemia   . Hypertension   . Nonischemic cardiomyopathy (HCC) Noted as early as 2007   Per chart review (cards consult note 2011), EF of 40% in 2007, down to 20-25% in 2011    Patient Active Problem List   Diagnosis Date Noted  . Pain in left testicle 11/06/2016  . Dyslipidemia 04/17/2016  . Uncontrolled type 2 diabetes mellitus without complication, with long-term current use of insulin (HCC) 08/07/2015  . Chronic systolic CHF (congestive heart failure) (HCC) 06/18/2015  . Needs flu shot 12/23/2013  . Hyperglycemia 11/18/2013  . Type 2 diabetes mellitus (HCC) 11/18/2013  . Other primary cardiomyopathies 11/18/2013  . Diabetes (HCC) 06/17/2013  . Type II or unspecified type diabetes mellitus  11/20/2012  . Non-ischemic cardiomyopathy (HCC) 11/06/2012  . HTN (hypertension) 11/06/2012  .  HLD (hyperlipidemia) 11/06/2012    Past Surgical History:  Procedure Laterality Date  . None         Home Medications    Prior to Admission medications   Medication Sig Start Date End Date Taking? Authorizing Provider  atorvastatin (LIPITOR) 40 MG tablet Take 1 tablet (40 mg total) by mouth daily at 6 PM. 02/05/17   Quentin Angst, MD  carvedilol (COREG) 6.25 MG tablet Take 1.5 tablets (9.375 mg total) by mouth 2 (two) times daily with a meal. 02/05/17   Jegede, Olugbemiga E, MD  glipiZIDE (GLUCOTROL) 10 MG tablet Take 1 tablet (10 mg total) by mouth 2 (two) times daily before a meal. 02/05/17   Jegede, Olugbemiga E, MD  insulin aspart (NOVOLOG) 100 UNIT/ML injection Inject 10-20 Units into the skin 3 (three) times daily with meals. 02/05/17   Quentin Angst, MD  insulin detemir (LEVEMIR) 100 UNIT/ML injection Inject 0.2 mLs (20 Units total) into the skin at bedtime. 03/04/17   Quentin Angst, MD  Insulin Syringes, Disposable, U-100 1 ML MISC Insulin syringes 30-gauge 11/20/12   Rai, Ripudeep K, MD  metFORMIN (GLUCOPHAGE) 1000 MG tablet TAKE 1 TABLET BY MOUTH 2 TIMES DAILY WITH A MEAL 02/05/17   Jegede, Phylliss Blakes, MD  nitroGLYCERIN (NITROSTAT) 0.4 MG SL tablet Place 1 tablet (0.4 mg total) under the tongue every 5 (five) minutes as needed for chest pain. Patient not taking: Reported on 04/17/2016 02/20/15  Wendall Stade, MD  Omega-3 Fatty Acids (FISH OIL) 1000 MG CPDR Take 2,000 mg by mouth daily. 02/05/17   Quentin Angst, MD  pantoprazole (PROTONIX) 20 MG tablet Take 1 tablet (20 mg total) by mouth 2 (two) times daily. 03/05/17   Ward, Chase Picket, PA-C  sacubitril-valsartan (ENTRESTO) 49-51 MG Take 1 tablet by mouth 2 (two) times daily. 02/05/17   Quentin Angst, MD    Family History Family History  Problem Relation Age of Onset  . Diabetes Mother   . Hypertension Mother   . Stroke Maternal Aunt   . Heart attack Neg Hx     Social  History Social History   Tobacco Use  . Smoking status: Never Smoker  . Smokeless tobacco: Never Used  Substance Use Topics  . Alcohol use: Yes    Comment: "occasional" when "hanging out with the wrong people" No recent use.  . Drug use: No    Comment: Last in 2013     Allergies   Patient has no known allergies.   Review of Systems Review of Systems  Gastrointestinal: Positive for abdominal pain. Negative for blood in stool, constipation, diarrhea, nausea and vomiting.  All other systems reviewed and are negative.    Physical Exam Updated Vital Signs BP 120/90   Pulse 79   Temp 98.3 F (36.8 C)   Resp 18   Ht  (1.803 m)   Wt 81.6 kg (180 lb)   SpO2 98%   BMI 25.10 kg/m   Physical Exam  Constitutional: He is oriented to person, place, and time. He appears well-developed and well-nourished. No distress.  Afebrile, well-appearing.  HENT:  Head: Normocephalic and atraumatic.  Cardiovascular: Normal rate, regular rhythm and normal heart sounds.  No murmur heard. Pulmonary/Chest: Effort normal and breath sounds normal. No respiratory distress.  Abdominal: Soft. He exhibits no distension. There is tenderness.  Epigastric tenderness with no rebound or guarding. Negative Murphy's.   Musculoskeletal: He exhibits no edema.  Neurological: He is alert and oriented to person, place, and time.  Skin: Skin is warm and dry.  Nursing note and vitals reviewed.    ED Treatments / Results  Labs (all labs ordered are listed, but only abnormal results are displayed) Labs Reviewed  COMPREHENSIVE METABOLIC PANEL - Abnormal; Notable for the following components:      Result Value   Potassium 3.4 (*)    All other components within normal limits  URINALYSIS, ROUTINE W REFLEX MICROSCOPIC - Abnormal; Notable for the following components:   Specific Gravity, Urine 1.031 (*)    All other components within normal limits  LIPASE, BLOOD  CBC    EKG  EKG  Interpretation None       Radiology No results found.  Procedures Procedures (including critical care time)  Medications Ordered in ED Medications  pantoprazole (PROTONIX) EC tablet 40 mg (40 mg Oral Given 03/05/17 0130)  gi cocktail (Maalox,Lidocaine,Donnatal) (30 mLs Oral Given 03/05/17 0131)     Initial Impression / Assessment and Plan / ED Course  I have reviewed the triage vital signs and the nursing notes.  Pertinent labs & imaging results that were available during my care of the patient were reviewed by me and considered in my medical decision making (see chart for details).    Jason Stevenson is a 31 y.o. male who presents to ED for epigastric abdominal pain x 5 days. About 7-10 days ago, he had URI symptoms and was taking 4 Aleve at  once TID. NSAID use likely contributory to symptoms today.  He is afebrile, hemodynamically stable with no peritoneal signs.  He appears very well.  Labs reviewed and reassuring.  GI cocktail given in the ED and will start on PPI.  PCP follow-up encouraged.  Reasons to return to ER discussed and all questions answered.   Final Clinical Impressions(s) / ED Diagnoses   Final diagnoses:  Acute gastritis without hemorrhage, unspecified gastritis type    ED Discharge Orders        Ordered    pantoprazole (PROTONIX) 20 MG tablet  2 times daily     03/05/17 0132       Ward, Chase Picket, PA-C 03/05/17 0155    Palumbo, April, MD 03/05/17 [phone]

## 2017-05-14 ENCOUNTER — Other Ambulatory Visit: Payer: Self-pay

## 2017-05-14 ENCOUNTER — Encounter: Payer: Self-pay | Admitting: Internal Medicine

## 2017-05-14 ENCOUNTER — Ambulatory Visit: Payer: 59 | Attending: Internal Medicine | Admitting: Internal Medicine

## 2017-05-14 VITALS — BP 124/84 | HR 83 | Temp 98.4°F | Resp 16 | Ht 71.0 in | Wt 190.6 lb

## 2017-05-14 DIAGNOSIS — I429 Cardiomyopathy, unspecified: Secondary | ICD-10-CM | POA: Diagnosis not present

## 2017-05-14 DIAGNOSIS — Z794 Long term (current) use of insulin: Secondary | ICD-10-CM | POA: Insufficient documentation

## 2017-05-14 DIAGNOSIS — E785 Hyperlipidemia, unspecified: Secondary | ICD-10-CM | POA: Insufficient documentation

## 2017-05-14 DIAGNOSIS — E1165 Type 2 diabetes mellitus with hyperglycemia: Secondary | ICD-10-CM | POA: Diagnosis not present

## 2017-05-14 DIAGNOSIS — I5022 Chronic systolic (congestive) heart failure: Secondary | ICD-10-CM | POA: Diagnosis not present

## 2017-05-14 DIAGNOSIS — I428 Other cardiomyopathies: Secondary | ICD-10-CM

## 2017-05-14 DIAGNOSIS — I110 Hypertensive heart disease with heart failure: Secondary | ICD-10-CM | POA: Insufficient documentation

## 2017-05-14 DIAGNOSIS — IMO0001 Reserved for inherently not codable concepts without codable children: Secondary | ICD-10-CM

## 2017-05-14 DIAGNOSIS — E119 Type 2 diabetes mellitus without complications: Secondary | ICD-10-CM | POA: Insufficient documentation

## 2017-05-14 DIAGNOSIS — Z79899 Other long term (current) drug therapy: Secondary | ICD-10-CM | POA: Diagnosis not present

## 2017-05-14 LAB — GLUCOSE, POCT (MANUAL RESULT ENTRY): POC Glucose: 202 mg/dl — AB (ref 70–99)

## 2017-05-14 LAB — POCT GLYCOSYLATED HEMOGLOBIN (HGB A1C): HEMOGLOBIN A1C: 9.1

## 2017-05-14 MED ORDER — METFORMIN HCL 1000 MG PO TABS: ORAL_TABLET | ORAL | 3 refills | Status: DC

## 2017-05-14 MED ORDER — FISH OIL 1000 MG PO CPDR: 2000.0000 mg | DELAYED_RELEASE_CAPSULE | Freq: Every day | ORAL | 3 refills | Status: DC

## 2017-05-14 MED ORDER — SACUBITRIL-VALSARTAN 49-51 MG PO TABS: 1.0000 | ORAL_TABLET | Freq: Two times a day (BID) | ORAL | 3 refills | Status: DC

## 2017-05-14 MED ORDER — INSULIN ASPART 100 UNIT/ML ~~LOC~~ SOLN: 10.0000 [IU] | Freq: Three times a day (TID) | SUBCUTANEOUS | 3 refills | Status: DC

## 2017-05-14 MED ORDER — GLIPIZIDE 10 MG PO TABS: 10.0000 mg | ORAL_TABLET | Freq: Two times a day (BID) | ORAL | 3 refills | Status: DC

## 2017-05-14 MED ORDER — CARVEDILOL 6.25 MG PO TABS: 9.3750 mg | ORAL_TABLET | Freq: Two times a day (BID) | ORAL | 6 refills | Status: DC

## 2017-05-14 MED ORDER — INSULIN DETEMIR 100 UNIT/ML ~~LOC~~ SOLN: 20.0000 [IU] | Freq: Every day | SUBCUTANEOUS | 3 refills | Status: DC

## 2017-05-14 MED ORDER — ATORVASTATIN CALCIUM 40 MG PO TABS: 40.0000 mg | ORAL_TABLET | Freq: Every day | ORAL | 3 refills | Status: DC

## 2017-05-14 MED FILL — !NOVOLOG 100UNITS/ML VIAL: 100/ML | 16 days supply | Qty: 10 | Fill #0

## 2017-05-14 MED FILL — ENTRESTO 49 MG-51 MG TABLET: 49-51 | 30 days supply | Qty: 60 | Fill #0

## 2017-05-14 MED FILL — CARVEDILOL 6.25 MG TABLET: 6.25 | 30 days supply | Qty: 90 | Fill #0

## 2017-05-14 MED FILL — !LEVEMIR 100 UNITS/ML VIAL: 100/ML | 28 days supply | Qty: 10 | Fill #0

## 2017-05-14 MED FILL — ATORVASTATIN 40 MG TABLET: 40 | 30 days supply | Qty: 30 | Fill #0

## 2017-05-14 MED FILL — metFORMIN HCL 1000 MG TABS: 1000 | 30 days supply | Qty: 60 | Fill #0

## 2017-05-14 MED FILL — glipiZIDE 10 MG TABS: 10 | 30 days supply | Qty: 60 | Fill #0

## 2017-05-14 NOTE — Progress Notes (Signed)
Jason Stevenson, is a 32 y.o. male  UGQ:[id number]  UEK:[id number]  DOB - 1986/03/01  Chief Complaint  Patient presents with  . Diabetes      Subjective:   Jason Stevenson is a 32 y.o. male with history of type 2 diabetes mellitus without complication, hypertension, nonischemic heart disease and hyperlipidemia who presents here today for a follow up visit and medication refill. Patient has not been adherent with his medications diet and/or exercise. He started doing the right thing recently and now seeing changes in his blood sugar and his general conditions. HbA1C is 9.1% today. He insists today that he will do even "better" going forward. His last ECHO showed LVEF of 20 - 25% on 08/21/2015. Patient needs ICD placement but he never made it back to Cardiologist because "He does not want ICD placed".Patient has No headache, No chest pain, No abdominal pain - No Nausea, No new weakness tingling or numbness, No Cough - SOB.  ALLERGIES: No Known Allergies  PAST MEDICAL HISTORY: Past Medical History:  Diagnosis Date  . Chronic systolic CHF (congestive heart failure) (HCC) 06/18/2015  . Diabetes mellitus without complication (HCC)   . Hyperlipidemia   . Hypertension   . Nonischemic cardiomyopathy (HCC) Noted as early as 2007   Per chart review (cards consult note 2011), EF of 40% in 2007, down to 20-25% in 2011    MEDICATIONS AT HOME: Prior to Admission medications   Medication Sig Start Date End Date Taking? Authorizing Provider  atorvastatin (LIPITOR) 40 MG tablet Take 1 tablet (40 mg total) by mouth daily at 6 PM. 05/14/17   Quentin Angst, MD  carvedilol (COREG) 6.25 MG tablet Take 1.5 tablets (9.375 mg total) by mouth 2 (two) times daily with a meal. 05/14/17   Treva Huyett E, MD  glipiZIDE (GLUCOTROL) 10 MG tablet Take 1 tablet (10 mg total) by mouth 2 (two) times daily before a meal. 05/14/17   Jamekia Gannett E, MD  insulin aspart (NOVOLOG) 100 UNIT/ML injection  Inject 10-20 Units into the skin 3 (three) times daily with meals. 05/14/17   Quentin Angst, MD  insulin detemir (LEVEMIR) 100 UNIT/ML injection Inject 0.2 mLs (20 Units total) into the skin at bedtime. 05/14/17   Quentin Angst, MD  Insulin Syringes, Disposable, U-100 1 ML MISC Insulin syringes 30-gauge 11/20/12   Rai, Ripudeep K, MD  metFORMIN (GLUCOPHAGE) 1000 MG tablet TAKE 1 TABLET BY MOUTH 2 TIMES DAILY WITH A MEAL 05/14/17   Maudie Shingledecker, Phylliss Blakes, MD  nitroGLYCERIN (NITROSTAT) 0.4 MG SL tablet Place 1 tablet (0.4 mg total) under the tongue every 5 (five) minutes as needed for chest pain. Patient not taking: Reported on 04/17/2016 02/20/15   Wendall Stade, MD  Omega-3 Fatty Acids (FISH OIL) 1000 MG CPDR Take 2,000 mg by mouth daily. 05/14/17   Quentin Angst, MD  pantoprazole (PROTONIX) 20 MG tablet Take 1 tablet (20 mg total) by mouth 2 (two) times daily. 03/05/17   Ward, Chase Picket, PA-C  sacubitril-valsartan (ENTRESTO) 49-51 MG Take 1 tablet by mouth 2 (two) times daily. 05/14/17   Quentin Angst, MD    Objective:   Vitals:   05/14/17 1505 05/14/17 0934 05/14/17 0955  BP: (!) 143/96 126/85 124/84  Pulse: 90  83  Resp: 16    Temp: 98.4 F (36.9 C)    TempSrc: Oral    SpO2: 98%    Weight: 190 lb 9.6 oz (86.5 kg)    Height: 5\' 11"  (  1.803 m)     Exam General appearance : Awake, alert, not in any distress. Speech Clear. Not toxic looking HEENT: Atraumatic and Normocephalic, pupils equally reactive to light and accomodation Neck: Supple, no JVD. No cervical lymphadenopathy.  Chest: Good air entry bilaterally, no added sounds  CVS: S1 S2 regular, no murmurs.  Abdomen: Bowel sounds present, Non tender and not distended with no gaurding, rigidity or rebound. Extremities: B/L Lower Ext shows no edema, both legs are warm to touch Neurology: Awake alert, and oriented X 3, CN II-XII intact, Non focal Skin: No Rash  Data Review Lab Results  Component Value Date    HGBA1C 9.1 05/14/2017   HGBA1C 13.0 02/05/2017   HGBA1C 11.6 11/06/2016    Assessment & Plan   1. Type 2 diabetes mellitus without complication, with long-term current use of insulin (HCC)  - Glucose (CBG) - HgB A1c  Refill - glipiZIDE (GLUCOTROL) 10 MG tablet; Take 1 tablet (10 mg total) by mouth 2 (two) times daily before a meal.  Dispense: 180 tablet; Refill: 3 - insulin aspart (NOVOLOG) 100 UNIT/ML injection; Inject 10-20 Units into the skin 3 (three) times daily with meals.  Dispense: 3 vial; Refill: 3 - insulin detemir (LEVEMIR) 100 UNIT/ML injection; Inject 0.2 mLs (20 Units total) into the skin at bedtime.  Dispense: 30 mL; Refill: 3 - metFORMIN (GLUCOPHAGE) 1000 MG tablet; TAKE 1 TABLET BY MOUTH 2 TIMES DAILY WITH A MEAL  Dispense: 180 tablet; Refill: 3 - Ambulatory referral to Podiatry  2. Dyslipidemia Refill - atorvastatin (LIPITOR) 40 MG tablet; Take 1 tablet (40 mg total) by mouth daily at 6 PM.  Dispense: 90 tablet; Refill: 3 - Omega-3 Fatty Acids (FISH OIL) 1000 MG CPDR; Take 2,000 mg by mouth daily.  Dispense: 60 capsule; Refill: 3  3. Non-ischemic cardiomyopathy (HCC) Refill - carvedilol (COREG) 6.25 MG tablet; Take 1.5 tablets (9.375 mg total) by mouth 2 (two) times daily with a meal.  Dispense: 270 tablet; Refill: 6 - sacubitril-valsartan (ENTRESTO) 49-51 MG; Take 1 tablet by mouth 2 (two) times daily.  Dispense: 180 tablet; Refill: 3   Patient have been counseled extensively about nutrition and exercise. Other issues discussed during this visit include: low cholesterol diet, weight control and daily exercise, foot care, annual eye examinations at Ophthalmology, importance of adherence with medications and regular follow-up. We also discussed long term complications of uncontrolled diabetes and hypertension.   Return in about 3 months (around 08/12/2017) for Hemoglobin A1C and Follow up, DM, Follow up HTN.  The patient was given clear instructions to go to ER or  return to medical center if symptoms don't improve, worsen or new problems develop. The patient verbalized understanding. The patient was told to call to get lab results if they haven't heard anything in the next week.   This note has been created with Education officer, environmental. Any transcriptional errors are unintentional.    Jeanann Lewandowsky, MD, MHA, Maxwell Caul, CPE [facility] and [facility] [place], [place] [phone]   05/14/2017, 10:02 AM

## 2017-05-14 NOTE — Patient Instructions (Signed)
Diabetes and Foot Care Diabetes may cause you to have problems because of poor blood supply (circulation) to your feet and legs. This may cause the skin on your feet to become thinner, break easier, and heal more slowly. Your skin may become dry, and the skin may peel and crack. You may also have nerve damage in your legs and feet causing decreased feeling in them. You may not notice minor injuries to your feet that could lead to infections or more serious problems. Taking care of your feet is one of the most important things you can do for yourself. Follow these instructions at home:  Wear shoes at all times, even in the house. Do not go barefoot. Bare feet are easily injured.  Check your feet daily for blisters, cuts, and redness. If you cannot see the bottom of your feet, use a mirror or ask someone for help.  Wash your feet with warm water (do not use hot water) and mild soap. Then pat your feet and the areas between your toes until they are completely dry. Do not soak your feet as this can dry your skin.  Apply a moisturizing lotion or petroleum jelly (that does not contain alcohol and is unscented) to the skin on your feet and to dry, brittle toenails. Do not apply lotion between your toes.  Trim your toenails straight across. Do not dig under them or around the cuticle. File the edges of your nails with an emery board or nail file.  Do not cut corns or calluses or try to remove them with medicine.  Wear clean socks or stockings every day. Make sure they are not too tight. Do not wear knee-high stockings since they may decrease blood flow to your legs.  Wear shoes that fit properly and have enough cushioning. To break in new shoes, wear them for just a few hours a day. This prevents you from injuring your feet. Always look in your shoes before you put them on to be sure there are no objects inside.  Do not cross your legs. This may decrease the blood flow to your feet.  If you find a  minor scrape, cut, or break in the skin on your feet, keep it and the skin around it clean and dry. These areas may be cleansed with mild soap and water. Do not cleanse the area with peroxide, alcohol, or iodine.  When you remove an adhesive bandage, be sure not to damage the skin around it.  If you have a wound, look at it several times a day to make sure it is healing.  Do not use heating pads or hot water bottles. They may burn your skin. If you have lost feeling in your feet or legs, you may not know it is happening until it is too late.  Make sure your health care provider performs a complete foot exam at least annually or more often if you have foot problems. Report any cuts, sores, or bruises to your health care provider immediately. Contact a health care provider if:  You have an injury that is not healing.  You have cuts or breaks in the skin.  You have an ingrown nail.  You notice redness on your legs or feet.  You feel burning or tingling in your legs or feet.  You have pain or cramps in your legs and feet.  Your legs or feet are numb.  Your feet always feel cold. Get help right away if:  There is increasing   redness, swelling, or pain in or around a wound.  There is a red line that goes up your leg.  Pus is coming from a wound.  You develop a fever or as directed by your health care provider.  You notice a bad smell coming from an ulcer or wound. This information is not intended to replace advice given to you by your health care provider. Make sure you discuss any questions you have with your health care provider. Document Released: 03/29/2000 Document Revised: 09/07/2015 Document Reviewed: 09/08/2012 Elsevier Interactive Patient Education  2017 [employer] Diabetes Mellitus and Exercise Exercising regularly is important for your overall health, especially when you have diabetes (diabetes mellitus). Exercising is not only about losing weight. It has many health  benefits, such as increasing muscle strength and bone density and reducing body fat and stress. This leads to improved fitness, flexibility, and endurance, all of which result in better overall health. Exercise has additional benefits for people with diabetes, including:  Reducing appetite.  Helping to lower and control blood glucose.  Lowering blood pressure.  Helping to control amounts of fatty substances (lipids) in the blood, such as cholesterol and triglycerides.  Helping the body to respond better to insulin (improving insulin sensitivity).  Reducing how much insulin the body needs.  Decreasing the risk for heart disease by: ? Lowering cholesterol and triglyceride levels. ? Increasing the levels of good cholesterol. ? Lowering blood glucose levels.  What is my activity plan? Your health care provider or certified diabetes educator can help you make a plan for the type and frequency of exercise (activity plan) that works for you. Make sure that you:  Do at least 150 minutes of moderate-intensity or vigorous-intensity exercise each week. This could be brisk walking, biking, or water aerobics. ? Do stretching and strength exercises, such as yoga or weightlifting, at least 2 times a week. ? Spread out your activity over at least 3 days of the week.  Get some form of physical activity every day. ? Do not go more than 2 days in a row without some kind of physical activity. ? Avoid being inactive for more than 90 minutes at a time. Take frequent breaks to walk or stretch.  Choose a type of exercise or activity that you enjoy, and set realistic goals.  Start slowly, and gradually increase the intensity of your exercise over time.  What do I need to know about managing my diabetes?  Check your blood glucose before and after exercising. ? If your blood glucose is higher than 240 mg/dL (13.3 mmol/L) before you exercise, check your urine for ketones. If you have ketones in your urine,  do not exercise until your blood glucose returns to normal.  Know the symptoms of low blood glucose (hypoglycemia) and how to treat it. Your risk for hypoglycemia increases during and after exercise. Common symptoms of hypoglycemia can include: ? Hunger. ? Anxiety. ? Sweating and feeling clammy. ? Confusion. ? Dizziness or feeling light-headed. ? Increased heart rate or palpitations. ? Blurry vision. ? Tingling or numbness around the mouth, lips, or tongue. ? Tremors or shakes. ? Irritability.  Keep a rapid-acting carbohydrate snack available before, during, and after exercise to help prevent or treat hypoglycemia.  Avoid injecting insulin into areas of the body that are going to be exercised. For example, avoid injecting insulin into: ? The arms, when playing tennis. ? The legs, when jogging.  Keep records of your exercise habits. Doing this can help you and   your health care provider adjust your diabetes management plan as needed. Write down: ? Food that you eat before and after you exercise. ? Blood glucose levels before and after you exercise. ? The type and amount of exercise you have done. ? When your insulin is expected to peak, if you use insulin. Avoid exercising at times when your insulin is peaking.  When you start a new exercise or activity, work with your health care provider to make sure the activity is safe for you, and to adjust your insulin, medicines, or food intake as needed.  Drink plenty of water while you exercise to prevent dehydration or heat stroke. Drink enough fluid to keep your urine clear or pale yellow. This information is not intended to replace advice given to you by your health care provider. Make sure you discuss any questions you have with your health care provider. Document Released: 06/22/2003 Document Revised: 10/20/2015 Document Reviewed: 09/11/2015 Elsevier Interactive Patient Education  2018 [employer] Inc[employer]  

## 2017-05-14 NOTE — Progress Notes (Signed)
Follow up DM

## 2017-08-12 ENCOUNTER — Other Ambulatory Visit: Payer: Self-pay

## 2017-08-12 ENCOUNTER — Encounter: Payer: Self-pay | Admitting: Internal Medicine

## 2017-08-12 ENCOUNTER — Ambulatory Visit: Payer: 59 | Attending: Internal Medicine | Admitting: Internal Medicine

## 2017-08-12 VITALS — BP 123/85 | HR 80 | Temp 98.2°F | Resp 16 | Ht 71.0 in | Wt 190.2 lb

## 2017-08-12 DIAGNOSIS — G8929 Other chronic pain: Secondary | ICD-10-CM | POA: Insufficient documentation

## 2017-08-12 DIAGNOSIS — Z79899 Other long term (current) drug therapy: Secondary | ICD-10-CM | POA: Insufficient documentation

## 2017-08-12 DIAGNOSIS — IMO0001 Reserved for inherently not codable concepts without codable children: Secondary | ICD-10-CM

## 2017-08-12 DIAGNOSIS — I10 Essential (primary) hypertension: Secondary | ICD-10-CM | POA: Diagnosis not present

## 2017-08-12 DIAGNOSIS — I428 Other cardiomyopathies: Secondary | ICD-10-CM | POA: Diagnosis not present

## 2017-08-12 DIAGNOSIS — Z23 Encounter for immunization: Secondary | ICD-10-CM

## 2017-08-12 DIAGNOSIS — M545 Low back pain, unspecified: Secondary | ICD-10-CM

## 2017-08-12 DIAGNOSIS — E785 Hyperlipidemia, unspecified: Secondary | ICD-10-CM | POA: Insufficient documentation

## 2017-08-12 DIAGNOSIS — I5022 Chronic systolic (congestive) heart failure: Secondary | ICD-10-CM

## 2017-08-12 DIAGNOSIS — I110 Hypertensive heart disease with heart failure: Secondary | ICD-10-CM | POA: Insufficient documentation

## 2017-08-12 DIAGNOSIS — Z8719 Personal history of other diseases of the digestive system: Secondary | ICD-10-CM

## 2017-08-12 DIAGNOSIS — I429 Cardiomyopathy, unspecified: Secondary | ICD-10-CM | POA: Diagnosis not present

## 2017-08-12 DIAGNOSIS — E1165 Type 2 diabetes mellitus with hyperglycemia: Secondary | ICD-10-CM | POA: Diagnosis not present

## 2017-08-12 DIAGNOSIS — Z794 Long term (current) use of insulin: Secondary | ICD-10-CM

## 2017-08-12 DIAGNOSIS — E119 Type 2 diabetes mellitus without complications: Secondary | ICD-10-CM | POA: Diagnosis not present

## 2017-08-12 LAB — GLUCOSE, POCT (MANUAL RESULT ENTRY): POC Glucose: 156 mg/dl — AB (ref 70–99)

## 2017-08-12 LAB — POCT GLYCOSYLATED HEMOGLOBIN (HGB A1C): HEMOGLOBIN A1C: 11.6

## 2017-08-12 MED ORDER — ATORVASTATIN CALCIUM 40 MG PO TABS: 40.0000 mg | ORAL_TABLET | Freq: Every day | ORAL | 3 refills | Status: DC

## 2017-08-12 MED ORDER — INSULIN ASPART 100 UNIT/ML ~~LOC~~ SOLN: 10.0000 [IU] | Freq: Three times a day (TID) | SUBCUTANEOUS | 3 refills | Status: DC

## 2017-08-12 MED ORDER — TETANUS-DIPHTH-ACELL PERTUSSIS 5-2.5-18.5 LF-MCG/0.5 IM SUSP: 0.5000 mL | Freq: Once | INTRAMUSCULAR | 0 refills | Status: DC

## 2017-08-12 MED ORDER — CARVEDILOL 6.25 MG PO TABS: 9.3750 mg | ORAL_TABLET | Freq: Two times a day (BID) | ORAL | 6 refills | Status: DC

## 2017-08-12 MED ORDER — PNEUMOCOCCAL VAC POLYVALENT 25 MCG/0.5ML IJ INJ: 0.5000 mL | INJECTION | INTRAMUSCULAR | 0 refills | Status: DC

## 2017-08-12 MED ORDER — INSULIN DETEMIR 100 UNIT/ML ~~LOC~~ SOLN: 20.0000 [IU] | Freq: Every day | SUBCUTANEOUS | 3 refills | Status: DC

## 2017-08-12 NOTE — Progress Notes (Signed)
Patient ID: Jason Stevenson, male    DOB: March 16, 1986  MRN: [id number]  CC: re-establish and Diabetes   Subjective: Jason Stevenson is a 32 y.o. male who presents for chronic disease management and to establish with me as PCP.  Previous PCP was Dr. Hyman Hopes a home he saw 1/2 0 19. His concerns today include:  Patient with history of DM, HTN, HL, NICM with systolic CHF EF 20-25% (echo 08/2015, Cardiologist Dr. Eden Emms)  1.  LBP chronic:  For yrs.  Constant but more when he stands from seated position. Reports being told 7-8 yrs ago on an ER visit for LBP that he had a herniated disc.  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 No imaging done.  Given Percocets which he took for a while. -no numbness or tingling in legs, no weakness in legs, no incontinence of bowel and bladder.   -rates pain 5/10 most days.  Worse with prolong sitting and heavy lifting. Bought a back stretcher 1 wk ago that he saw advertized on [employer].   2. Hx of hemorrhoid. Had clot removed from one several yrs ago. Occasional flare up.  No blood in stools.  BMs not hard and has BM 3 x a day.   3.  DM:   Eating habits:  "I haven't been eating right as consistently as I should and not taking my medications consistently."  Taking oral meds once a day instead of BID; same with Novolog.  Running low on Novolog. Thinks insurance will not cover one of his insulin -eating more fast foods -just started riding a stationary bike for past 3 wks.  Very active at work where he does a lot of lifting.  Works at [employer]  4.  HTN/NICM: out of Carvedilolol 2-3 wks. -no CP/SOB/PND Last saw cardiology about 2 years ago.  Plan was for placement of ICD but patient states he never went back because he did not want to have the device placed.  5.  HL:  Out of Lipitor x 1 mth  Patient Active Problem List   Diagnosis Date Noted  . Pain in left testicle 11/06/2016  . Dyslipidemia 04/17/2016  . Uncontrolled type 2  diabetes mellitus without complication, with long-term current use of insulin (HCC) 08/07/2015  . Chronic systolic CHF (congestive heart failure) (HCC) 06/18/2015  . Needs flu shot 12/23/2013  . Hyperglycemia 11/18/2013  . Type 2 diabetes mellitus (HCC) 11/18/2013  . Other primary cardiomyopathies 11/18/2013  . Diabetes (HCC) 06/17/2013  . Type II or unspecified type diabetes mellitus  11/20/2012  . Non-ischemic cardiomyopathy (HCC) 11/06/2012  . HTN (hypertension) 11/06/2012  . HLD (hyperlipidemia) 11/06/2012     Current Outpatient Medications on File Prior to Visit  Medication Sig Dispense Refill  . glipiZIDE (GLUCOTROL) 10 MG tablet Take 1 tablet (10 mg total) by mouth 2 (two) times daily before a meal. 180 tablet 3  . Insulin Syringes, Disposable, U-100 1 ML MISC Insulin syringes 30-gauge 100 each 12  . metFORMIN (GLUCOPHAGE) 1000 MG tablet TAKE 1 TABLET BY MOUTH 2 TIMES DAILY WITH A MEAL 180 tablet 3  . nitroGLYCERIN (NITROSTAT) 0.4 MG SL tablet Place 1 tablet (0.4 mg total) under the tongue every 5 (five) minutes as needed for chest pain. (Patient not taking: Reported on 04/17/2016) 25 tablet 2  . Omega-3 Fatty Acids (FISH OIL) 1000 MG CPDR Take 2,000 mg by mouth daily. 60 capsule 3  . sacubitril-valsartan (ENTRESTO) 49-51 MG Take 1 tablet  by mouth 2 (two) times daily. 180 tablet 3   No current facility-administered medications on file prior to visit.     No Known Allergies  Social History   Socioeconomic History  . Marital status: Single    Spouse name: Not on file  . Number of children: Not on file  . Years of education: Not on file  . Highest education level: Not on file  Occupational History  . Occupation: Corporate treasurer  Social Needs  . Financial resource strain: Not on file  . Food insecurity:    Worry: Not on file    Inability: Not on file  . Transportation needs:    Medical: Not on file    Non-medical: Not on file  Tobacco Use  . Smoking status: Never Smoker   . Smokeless tobacco: Never Used  Substance and Sexual Activity  . Alcohol use: Yes    Comment: "occasional" when "hanging out with the wrong people" No recent use.  . Drug use: No    Types: Marijuana    Comment: Last in 2013  . Sexual activity: Not on file  Lifestyle  . Physical activity:    Days per week: Not on file    Minutes per session: Not on file  . Stress: Not on file  Relationships  . Social connections:    Talks on phone: Not on file    Gets together: Not on file    Attends religious service: Not on file    Active member of club or organization: Not on file    Attends meetings of clubs or organizations: Not on file    Relationship status: Not on file  . Intimate partner violence:    Fear of current or ex partner: Not on file    Emotionally abused: Not on file    Physically abused: Not on file    Forced sexual activity: Not on file  Other Topics Concern  . Not on file  Social History Narrative   Pt lives alone. Has no information on father.    Family History  Problem Relation Age of Onset  . Diabetes Mother   . Hypertension Mother   . Stroke Maternal Aunt   . Heart attack Neg Hx     Past Surgical History:  Procedure Laterality Date  . None      ROS: Review of Systems Negative except as stated above PHYSICAL EXAM: BP 123/85   Pulse 80   Temp 98.2 F (36.8 C) (Oral)   Resp 16   Ht 5\' 11"  (1.803 m)   Wt 190 lb 3.2 oz (86.3 kg)   SpO2 98%   BMI 26.53 kg/m   Wt Readings from Last 3 Encounters:  08/12/17 190 lb 3.2 oz (86.3 kg)  05/14/17 190 lb 9.6 oz (86.5 kg)  03/04/17 180 lb (81.6 kg)    Physical Exam General appearance - alert, well appearing, young African-American male and in no distress Mental status - alert, oriented to person, place, and time, normal mood, behavior, speech, dress, motor activity, and thought processes Mouth - mucous membranes moist, pharynx normal without lesions Neck - supple, no significant adenopathy Chest - clear  to auscultation, no wheezes, rales or rhonchi, symmetric air entry Heart - normal rate, regular rhythm, normal S1, S2, no murmurs, rubs, clicks or gallops.  Slight JVD Abdomen - soft, nontender, nondistended, no masses or organomegaly Musculoskeletal -no tenderness on palpation of the lumbar spine.  Straight leg raise on both sides because mild discomfort in  the lower back.  Power in both lower extremities proximally and distally 5 out of 5 bilaterally. Extremities -no lower extremity edema.    BS 156/A1C 11.6    Chemistry      Component Value Date/Time   NA 139 03/04/2017 2256   K 3.4 (L) 03/04/2017 2256   CL 105 03/04/2017 2256   CO2 28 03/04/2017 2256   BUN 15 03/04/2017 2256   CREATININE 0.84 03/04/2017 2256   CREATININE 0.80 04/17/2016 1204      Component Value Date/Time   CALCIUM 9.2 03/04/2017 2256   ALKPHOS 63 03/04/2017 2256   AST 27 03/04/2017 2256   ALT 24 03/04/2017 2256   BILITOT 0.8 03/04/2017 2256     Lab Results  Component Value Date   WBC 8.1 03/04/2017   HGB 14.2 03/04/2017   HCT 41.7 03/04/2017   MCV 85.1 03/04/2017   PLT 262 03/04/2017     ASSESSMENT AND PLAN: 1. Uncontrolled type 2 diabetes mellitus without complication, with long-term current use of insulin (HCC) Discussed the importance of healthy eating habits, regular aerobic exercise (at least 150 minutes a week as tolerated) and medication compliance to achieve or maintain control of diabetes and prevent future complications of the disease. -Patient has set goal to be more consistent with taking his medications.  His other goal is to cut back on eating fast foods - POCT glucose (manual entry) - POCT glycosylated hemoglobin (Hb A1C) - Microalbumin / creatinine urine ratio - insulin aspart (NOVOLOG) 100 UNIT/ML injection; Inject 10-20 Units into the skin 3 (three) times daily with meals.  Dispense: 3 vial; Refill: 3 - insulin detemir (LEVEMIR) 100 UNIT/ML injection; Inject 0.2 mLs (20 Units total)  into the skin at bedtime.  Dispense: 30 mL; Refill: 3  2. Immunization due - Tdap vaccine greater than or equal to 34yo IM - Pneumococcal polysaccharide vaccine 23-valent greater than or equal to 2yo subcutaneous/IM  3. Essential hypertension 4. Chronic systolic congestive heart failure (HCC) 5. NICM (nonischemic cardiomyopathy) (HCC) -Since he is not wanting to go back to cardiology I think we should repeat the echo to see whether the heart function has improve on Entresto and carvedilol.  Encourage compliance with medications - carvedilol (COREG) 6.25 MG tablet; Take 1.5 tablets (9.375 mg total) by mouth 2 (two) times daily with a meal.  Dispense: 270 tablet; Refill: 6 - ECHOCARDIOGRAM COMPLETE; Future - carvedilol (COREG) 6.25 MG tablet; Take 1.5 tablets (9.375 mg total) by mouth 2 (two) times daily with a meal.  Dispense: 270 tablet; Refill: 6   6. Hyperlipidemia, unspecified hyperlipidemia type - atorvastatin (LIPITOR) 40 MG tablet; Take 1 tablet (40 mg total) by mouth daily at 6 PM.  Dispense: 90 tablet; Refill: 3  7. Chronic bilateral low back pain without sciatica -Nonradicular in character. Recommend avoiding activities that cause flares like excessive lifting.  Advised to been at the knees when he does live work bends down to pick up anything.  Use heating pad as needed.  Over-the-counter Tylenol as needed. - DG Lumbar Spine Complete; Future  8. History of hemorrhoids Patient declined rectal exam today. Went over importance of keeping BMs soft and regular.   Patient was given the opportunity to ask questions.  Patient verbalized understanding of the plan and was able to repeat key elements of the plan.   Orders Placed This Encounter  Procedures  . DG Lumbar Spine Complete  . Tdap vaccine greater than or equal to 34yo IM  . Pneumococcal polysaccharide  vaccine 23-valent greater than or equal to 2yo subcutaneous/IM  . Microalbumin / creatinine urine ratio  . POCT glucose  (manual entry)  . POCT glycosylated hemoglobin (Hb A1C)  . ECHOCARDIOGRAM COMPLETE     Requested Prescriptions   Signed Prescriptions Disp Refills  . insulin aspart (NOVOLOG) 100 UNIT/ML injection 3 vial 3    Sig: Inject 10-20 Units into the skin 3 (three) times daily with meals.  . insulin detemir (LEVEMIR) 100 UNIT/ML injection 30 mL 3    Sig: Inject 0.2 mLs (20 Units total) into the skin at bedtime.  Marland Kitchen atorvastatin (LIPITOR) 40 MG tablet 90 tablet 3    Sig: Take 1 tablet (40 mg total) by mouth daily at 6 PM.  . carvedilol (COREG) 6.25 MG tablet 270 tablet 6    Sig: Take 1.5 tablets (9.375 mg total) by mouth 2 (two) times daily with a meal.    Return in about 3 months (around 11/11/2017).  [name], MD, FACP

## 2017-08-12 NOTE — Patient Instructions (Signed)
Td Vaccine (Tetanus and Diphtheria): What You Need to Know 1. Why get vaccinated? Tetanus  and diphtheria are very serious diseases. They are rare in the [place] today, but people who do become infected often have severe complications. Td vaccine is used to protect adolescents and adults from both of these diseases. Both tetanus and diphtheria are infections caused by bacteria. Diphtheria spreads from person to person through coughing or sneezing. Tetanus-causing bacteria enter the body through cuts, scratches, or wounds. TETANUS (lockjaw) causes painful muscle tightening and stiffness, usually all over the body.  It can lead to tightening of muscles in the head and neck so you can't open your mouth, swallow, or sometimes even breathe. Tetanus kills about 1 out of every 10 people who are infected even after receiving the best medical care.  DIPHTHERIA can cause a thick coating to form in the back of the throat.  It can lead to breathing problems, paralysis, heart failure, and death.  Before vaccines, as many as 200,000 cases of diphtheria and hundreds of cases of tetanus were reported in the [place] each year. Since vaccination began, reports of cases for both diseases have dropped by about 99%. 2. Td vaccine Td vaccine can protect adolescents and adults from tetanus and diphtheria. Td is usually given as a booster dose every 10 years but it can also be given earlier after a severe and dirty wound or burn. Another vaccine, called Tdap, which protects against pertussis in addition to tetanus and diphtheria, is sometimes recommended instead of Td vaccine. Your doctor or the person giving you the vaccine can give you more information. Td may safely be given at the same time as other vaccines. 3. Some people should not get this vaccine  A person who has ever had a life-threatening allergic reaction after a previous dose of any tetanus or diphtheria containing vaccine, OR has a severe  allergy to any part of this vaccine, should not get Td vaccine. Tell the person giving the vaccine about any severe allergies.  Talk to your doctor if you: ? had severe pain or swelling after any vaccine containing diphtheria or tetanus, ? ever had a condition called Guillain Barre Syndrome (GBS), ? aren't feeling well on the day the shot is scheduled. 4. What are the risks from Td vaccine? With any medicine, including vaccines, there is a chance of side effects. These are usually mild and go away on their own. Serious reactions are also possible but are rare. Most people who get Td vaccine do not have any problems with it. Mild problems following Td vaccine: (Did not interfere with activities)  Pain where the shot was given (about 8 people in 10)  Redness or swelling where the shot was given (about 1 person in 4)  Mild fever (rare)  Headache (about 1 person in 4)  Tiredness (about 1 person in 4)  Moderate problems following Td vaccine: (Interfered with activities, but did not require medical attention)  Fever over 102F (rare)  Severe problems following Td vaccine: (Unable to perform usual activities; required medical attention)  Swelling, severe pain, bleeding and/or redness in the arm where the shot was given (rare).  Problems that could happen after any vaccine:  People sometimes faint after a medical procedure, including vaccination. Sitting or lying down for about 15 minutes can help prevent fainting, and injuries caused by a fall. Tell your doctor if you feel dizzy, or have vision changes or ringing in the ears.  Some people get   severe pain in the shoulder and have difficulty moving the arm where a shot was given. This happens very rarely.  Any medication can cause a severe allergic reaction. Such reactions from a vaccine are very rare, estimated at fewer than 1 in a million doses, and would happen within a few minutes to a few hours after the vaccination. As with any  medicine, there is a very remote chance of a vaccine causing a serious injury or death. The safety of vaccines is always being monitored. For more information, visit: www.cdc.gov/vaccinesafety/ 5. What if there is a serious reaction? What should I look for? Look for anything that concerns you, such as signs of a severe allergic reaction, very high fever, or unusual behavior. Signs of a severe allergic reaction can include hives, swelling of the face and throat, difficulty breathing, a fast heartbeat, dizziness, and weakness. These would usually start a few minutes to a few hours after the vaccination. What should I do?  If you think it is a severe allergic reaction or other emergency that can't wait, call 9-1-1 or get the person to the nearest hospital. Otherwise, call your doctor.  Afterward, the reaction should be reported to the Vaccine Adverse Event Reporting System (VAERS). Your doctor might file this report, or you can do it yourself through the VAERS web site at www.vaers.hhs.gov, or by calling 1-800-822-7967. ? VAERS does not give medical advice. 6. The National Vaccine Injury Compensation Program The National Vaccine Injury Compensation Program (VICP) is a federal program that was created to compensate people who may have been injured by certain vaccines. Persons who believe they may have been injured by a vaccine can learn about the program and about filing a claim by calling 1-800-338-2382 or visiting the VICP website at www.hrsa.gov/vaccinecompensation. There is a time limit to file a claim for compensation. 7. How can I learn more?  Ask your doctor. He or she can give you the vaccine package insert or suggest other sources of information.  Call your local or state health department.  Contact the Centers for Disease Control and Prevention (CDC): ? Call 1-800-232-4636 (1-800-CDC-INFO) ? Visit CDC's website at www.cdc.gov/vaccines CDC Td Vaccine VIS (07/25/15) This information is  not intended to replace advice given to you by your health care provider. Make sure you discuss any questions you have with your health care provider. Document Released: 01/27/2006 Document Revised: 12/21/2015 Document Reviewed: 12/21/2015 Elsevier Interactive Patient Education  2017 [employer]. Pneumococcal Polysaccharide Vaccine: What You Need to Know 1. Why get vaccinated? Vaccination can protect older adults (and some children and younger adults) from pneumococcal disease. Pneumococcal disease is caused by bacteria that can spread from person to person through close contact. It can cause ear infections, and it can also lead to more serious infections of the:  Lungs (pneumonia),  Blood (bacteremia), and  Covering of the brain and spinal cord (meningitis). Meningitis can cause deafness and brain damage, and it can be fatal.  Anyone can get pneumococcal disease, but children under 2 years of age, people with certain medical conditions, adults over 65 years of age, and cigarette smokers are at the highest risk. About 18,000 older adults die each year from pneumococcal disease in the [place]. Treatment of pneumococcal infections with penicillin and other drugs used to be more effective. But some strains of the disease have become resistant to these drugs. This makes prevention of the disease, through vaccination, even more important. 2. Pneumococcal polysaccharide vaccine (PPSV23) Pneumococcal polysaccharide vaccine (PPSV23)   protects against 23 types of pneumococcal bacteria. It will not prevent all pneumococcal disease. PPSV23 is recommended for:  All adults 65 years of age and older,  Anyone 2 through 34 years of age with certain long-term health problems,  Anyone 2 through 34 years of age with a weakened immune system,  Adults 19 through 34 years of age who smoke cigarettes or have asthma.  Most people need only one dose of PPSV. A second dose is recommended for certain  high-risk groups. People 65 and older should get a dose even if they have gotten one or more doses of the vaccine before they turned 65. Your healthcare provider can give you more information about these recommendations. Most healthy adults develop protection within 2 to 3 weeks of getting the shot. 3. Some people should not get this vaccine  Anyone who has had a life-threatening allergic reaction to PPSV should not get another dose.  Anyone who has a severe allergy to any component of PPSV should not receive it. Tell your provider if you have any severe allergies.  Anyone who is moderately or severely ill when the shot is scheduled may be asked to wait until they recover before getting the vaccine. Someone with a mild illness can usually be vaccinated.  Children less than 2 years of age should not receive this vaccine.  There is no evidence that PPSV is harmful to either a pregnant woman or to her fetus. However, as a precaution, women who need the vaccine should be vaccinated before becoming pregnant, if possible. 4. Risks of a vaccine reaction With any medicine, including vaccines, there is a chance of side effects. These are usually mild and go away on their own, but serious reactions are also possible. About half of people who get PPSV have mild side effects, such as redness or pain where the shot is given, which go away within about two days. Less than 1 out of 100 people develop a fever, muscle aches, or more severe local reactions. Problems that could happen after any vaccine:  People sometimes faint after a medical procedure, including vaccination. Sitting or lying down for about 15 minutes can help prevent fainting, and injuries caused by a fall. Tell your doctor if you feel dizzy, or have vision changes or ringing in the ears.  Some people get severe pain in the shoulder and have difficulty moving the arm where a shot was given. This happens very rarely.  Any medication can cause a  severe allergic reaction. Such reactions from a vaccine are very rare, estimated at about 1 in a million doses, and would happen within a few minutes to a few hours after the vaccination. As with any medicine, there is a very remote chance of a vaccine causing a serious injury or death. The safety of vaccines is always being monitored. For more information, visit: www.cdc.gov/vaccinesafety/ 5. What if there is a serious reaction? What should I look for? Look for anything that concerns you, such as signs of a severe allergic reaction, very high fever, or unusual behavior. Signs of a severe allergic reaction can include hives, swelling of the face and throat, difficulty breathing, a fast heartbeat, dizziness, and weakness. These would usually start a few minutes to a few hours after the vaccination. What should I do? If you think it is a severe allergic reaction or other emergency that can't wait, call 9-1-1 or get to the nearest hospital. Otherwise, call your doctor. Afterward, the reaction should be reported to   the Vaccine Adverse Event Reporting System (VAERS). Your doctor might file this report, or you can do it yourself through the VAERS web site at www.vaers.hhs.gov, or by calling 1-800-822-7967. VAERS does not give medical advice. 6. How can I learn more?  Ask your doctor. He or she can give you the vaccine package insert or suggest other sources of information.  Call your local or state health department.  Contact the Centers for Disease Control and Prevention (CDC): ? Call 1-800-232-4636 (1-800-CDC-INFO) or ? Visit CDC's website at www.cdc.gov/vaccines CDC Pneumococcal Polysaccharide Vaccine VIS (08/06/13) This information is not intended to replace advice given to you by your health care provider. Make sure you discuss any questions you have with your health care provider. Document Released: 01/27/2006 Document Revised: 12/21/2015 Document Reviewed: 12/21/2015 Elsevier Interactive  Patient Education  2017 Elsevier Inc.  

## 2017-08-13 ENCOUNTER — Ambulatory Visit (HOSPITAL_COMMUNITY)
Admission: RE | Admit: 2017-08-13 | Discharge: 2017-08-13 | Disposition: A | Discharge status: Home / Self Care | Payer: 59 | Source: Ambulatory Visit | Attending: Internal Medicine | Admitting: Internal Medicine

## 2017-08-13 LAB — MICROALBUMIN / CREATININE URINE RATIO
Creatinine, Urine: 232 mg/dL
MICROALB/CREAT RATIO: 3.4 mg/g{creat} (ref 0.0–30.0)
Microalbumin, Urine: 8 ug/mL

## 2017-08-14 ENCOUNTER — Telehealth: Payer: Self-pay | Admitting: Internal Medicine

## 2017-08-14 NOTE — Telephone Encounter (Signed)
-----   Message from Wyline Beady sent at 08/13/2017  3:36 PM EDT ----- Regarding: No Show The above patient was a No show for their Echo appointment this afternoon. A phone call was made and a message was left.

## 2017-08-20 MED FILL — ATORVASTATIN CALCIUM 40 MG: 40 | 30 days supply | Qty: 30 | Fill #0

## 2017-08-20 MED FILL — CARVEDILOL 6.25 MG TABLET: 6.25 | 30 days supply | Qty: 90 | Fill #0

## 2017-11-11 ENCOUNTER — Ambulatory Visit: Payer: 59 | Admitting: Internal Medicine

## 2018-01-12 ENCOUNTER — Telehealth: Payer: Self-pay | Admitting: Internal Medicine

## 2018-01-12 NOTE — Telephone Encounter (Signed)
Patient called concerned because he does not have anymore insulin and cannot afford to buy any. He would like to know what he could do to have his medication since he is a diabetic. Please follow up with patient.

## 2018-01-13 ENCOUNTER — Other Ambulatory Visit: Payer: Self-pay

## 2018-01-13 NOTE — Telephone Encounter (Signed)
Will forward to [facility]

## 2018-01-13 NOTE — Telephone Encounter (Signed)
The pharmacy contacted me to investigate which insulin is covered by the patients insurance as they were getting some odd rejections when attempting to process the insurance.   According to representative for the insurance the patients plan does not cover any insulin. We will check with Jason Stevenson to see if he may qualify for PASS since his coverage doesn't include insulin.

## 2018-01-13 NOTE — Telephone Encounter (Signed)
See previous encounter. Pharmacy is actively working on solution and will contact the pt by this afternoon.

## 2018-06-09 ENCOUNTER — Other Ambulatory Visit: Payer: Self-pay | Admitting: Internal Medicine

## 2018-06-09 DIAGNOSIS — E119 Type 2 diabetes mellitus without complications: Secondary | ICD-10-CM

## 2018-06-09 DIAGNOSIS — Z794 Long term (current) use of insulin: Principal | ICD-10-CM

## 2018-07-01 ENCOUNTER — Encounter: Payer: Self-pay | Admitting: Internal Medicine

## 2018-07-01 ENCOUNTER — Other Ambulatory Visit: Payer: Self-pay

## 2018-07-01 DIAGNOSIS — IMO0001 Reserved for inherently not codable concepts without codable children: Secondary | ICD-10-CM

## 2018-07-01 DIAGNOSIS — I5022 Chronic systolic (congestive) heart failure: Secondary | ICD-10-CM

## 2018-07-01 DIAGNOSIS — E1165 Type 2 diabetes mellitus with hyperglycemia: Secondary | ICD-10-CM

## 2018-07-01 DIAGNOSIS — E785 Hyperlipidemia, unspecified: Secondary | ICD-10-CM

## 2018-07-01 DIAGNOSIS — I10 Essential (primary) hypertension: Secondary | ICD-10-CM

## 2018-07-01 DIAGNOSIS — Z794 Long term (current) use of insulin: Secondary | ICD-10-CM

## 2018-07-01 DIAGNOSIS — E119 Type 2 diabetes mellitus without complications: Secondary | ICD-10-CM

## 2018-07-01 DIAGNOSIS — I428 Other cardiomyopathies: Secondary | ICD-10-CM

## 2018-07-01 MED ORDER — CARVEDILOL 6.25 MG PO TABS
9.3750 mg | ORAL_TABLET | Freq: Two times a day (BID) | ORAL | 0 refills | Status: DC
Start: 2018-07-01 — End: 2018-07-08

## 2018-07-01 MED ORDER — INSULIN DETEMIR 100 UNIT/ML ~~LOC~~ SOLN: 20.0000 [IU] | Freq: Every day | SUBCUTANEOUS | 0 refills | Status: DC

## 2018-07-01 MED ORDER — ATORVASTATIN CALCIUM 40 MG PO TABS
40.0000 mg | ORAL_TABLET | Freq: Every day | ORAL | 0 refills | Status: DC
Start: 2018-07-01 — End: 2018-07-02

## 2018-07-01 MED ORDER — FISH OIL 1000 MG PO CPDR: 2000.0000 mg | DELAYED_RELEASE_CAPSULE | Freq: Every day | ORAL | 0 refills | Status: DC

## 2018-07-01 MED ORDER — METFORMIN HCL 1000 MG PO TABS: ORAL_TABLET | ORAL | 0 refills | Status: DC

## 2018-07-01 MED ORDER — GLIPIZIDE 10 MG PO TABS
10.0000 mg | ORAL_TABLET | Freq: Two times a day (BID) | ORAL | 0 refills | Status: DC
Start: 2018-07-01 — End: 2018-07-02

## 2018-07-01 MED ORDER — INSULIN ASPART 100 UNIT/ML ~~LOC~~ SOLN: 10.0000 [IU] | Freq: Three times a day (TID) | SUBCUTANEOUS | 0 refills | Status: DC

## 2018-07-02 ENCOUNTER — Other Ambulatory Visit: Payer: Self-pay

## 2018-07-02 ENCOUNTER — Ambulatory Visit: Payer: Self-pay | Attending: Family Medicine | Admitting: Family Medicine

## 2018-07-02 ENCOUNTER — Encounter: Payer: Self-pay | Admitting: Family Medicine

## 2018-07-02 VITALS — BP 119/84 | HR 81 | Temp 98.9°F | Resp 18 | Ht 71.0 in | Wt 182.0 lb

## 2018-07-02 DIAGNOSIS — I428 Other cardiomyopathies: Secondary | ICD-10-CM

## 2018-07-02 DIAGNOSIS — Z9114 Patient's other noncompliance with medication regimen: Secondary | ICD-10-CM

## 2018-07-02 DIAGNOSIS — E1165 Type 2 diabetes mellitus with hyperglycemia: Secondary | ICD-10-CM

## 2018-07-02 DIAGNOSIS — Z79899 Other long term (current) drug therapy: Secondary | ICD-10-CM

## 2018-07-02 DIAGNOSIS — E119 Type 2 diabetes mellitus without complications: Secondary | ICD-10-CM | POA: Diagnosis not present

## 2018-07-02 DIAGNOSIS — E7800 Pure hypercholesterolemia, unspecified: Secondary | ICD-10-CM

## 2018-07-02 DIAGNOSIS — I10 Essential (primary) hypertension: Secondary | ICD-10-CM

## 2018-07-02 DIAGNOSIS — Z794 Long term (current) use of insulin: Secondary | ICD-10-CM | POA: Diagnosis not present

## 2018-07-02 DIAGNOSIS — Z91148 Patient's other noncompliance with medication regimen for other reason: Secondary | ICD-10-CM

## 2018-07-02 DIAGNOSIS — IMO0001 Reserved for inherently not codable concepts without codable children: Secondary | ICD-10-CM

## 2018-07-02 DIAGNOSIS — I5022 Chronic systolic (congestive) heart failure: Secondary | ICD-10-CM

## 2018-07-02 DIAGNOSIS — E785 Hyperlipidemia, unspecified: Secondary | ICD-10-CM

## 2018-07-02 MED ORDER — INSULIN GLARGINE 100 UNIT/ML SOLOSTAR PEN: 30.0000 [IU] | PEN_INJECTOR | Freq: Every day | SUBCUTANEOUS | 6 refills | Status: DC

## 2018-07-02 MED ORDER — INSULIN ASPART 100 UNIT/ML ~~LOC~~ SOLN: 10.0000 [IU] | Freq: Three times a day (TID) | SUBCUTANEOUS | 0 refills | Status: DC

## 2018-07-02 MED ORDER — SACUBITRIL-VALSARTAN 49-51 MG PO TABS: 1.0000 | ORAL_TABLET | Freq: Two times a day (BID) | ORAL | 3 refills | Status: DC

## 2018-07-02 MED ORDER — TRUE METRIX METER W/DEVICE KIT: 1.0000 | PACK | Freq: Three times a day (TID) | 0 refills | Status: DC

## 2018-07-02 MED ORDER — ATORVASTATIN CALCIUM 40 MG PO TABS: 40.0000 mg | ORAL_TABLET | Freq: Every day | ORAL | 6 refills | Status: DC

## 2018-07-02 MED ORDER — METFORMIN HCL 1000 MG PO TABS: ORAL_TABLET | ORAL | 0 refills | Status: DC

## 2018-07-02 MED ORDER — GLIPIZIDE 10 MG PO TABS: 10.0000 mg | ORAL_TABLET | Freq: Two times a day (BID) | ORAL | 4 refills | Status: DC

## 2018-07-02 MED ORDER — GLUCOSE BLOOD VI STRP: ORAL_STRIP | 12 refills | Status: DC

## 2018-07-02 NOTE — Progress Notes (Signed)
Established Patient Office Visit  Subjective:  Patient ID: Jason Stevenson, male    DOB: Apr 14, 1986  Age: 33 y.o. MRN: [id number]  CC: No chief complaint on file.   HPI Jason Stevenson, 33 year old male who was last seen in the office on 08/12/2017, who presents for continued medical management of chronic diseases including type 2 diabetes, hypertension, hyperlipidemia, nonischemic cardiomyopathy with systolic CHF and ejection fraction of 20 to 25% (echo 2017) and chronic low back pain.  Patient's last hemoglobin A1c was 11.6 on 08/12/2017.      Patient presents at today's visit accompanied by his wife.  Patient reports that he has been compliant with his blood pressure medication which he takes on a daily basis.  Patient has not had any recent follow-up with his cardiologist regarding his cardiomyopathy or CHF.  Patient denies any shortness of breath, no peripheral edema and no difficulty lying down to sleep.  Patient does not require any additional pillows to prop himself up while sleeping.  Patient reports that he does not always take his daily insulin or other diabetic medications.  Patient states that he did take 20 units of Lantus last night as well as metformin as he knew that he would be coming to the office today in follow-up of his diabetes.  Patient reports that he is prescribed 20 units of Lantus twice daily and tries to take at least 1 dose.  Patient also sometimes uses regular insulin prior to meals.  Patient tries to take his metformin on a daily basis.  Patient believes that he has another medication for diabetes but he is not sure if he has taken the other medication recently.  Patient does take his cholesterol medication but on average this is only a few times per week.  Patient is not currently adherent to a diabetic diet and does not exercise on a regular basis.  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  Patient also reports prior issues with recurrent low back pain but this has mostly resolved since he is now in a more sedentary, less strenuous job.  Patient does have issues with urinary frequency and increased thirst which he attributes to his blood sugars being higher.  Patient admits that he does not test his blood sugars regularly and in fact his glucometer is not currently working and he believes that it has been more than 3 months since he is checked his blood sugars.  Patient denies any blurred vision.  Patient occasionally has some pins-and-needles sensations in his hands and feet but not on a daily basis.  Past Medical History:  Diagnosis Date  . Chronic systolic CHF (congestive heart failure) ([place]) 06/18/2015  . Diabetes mellitus without complication ([place])   . Hyperlipidemia   . Hypertension   . Nonischemic cardiomyopathy ([place]) Noted as early as 2007   Per chart review (cards consult note 2011), EF of 40% in 2007, down to 20-25% in 2011    Past Surgical History:  Procedure Laterality Date  . None      Family History  Problem Relation Age of Onset  . Diabetes Mother   . Hypertension Mother   . Stroke Maternal Aunt   . Heart attack Neg Hx    Social History   Tobacco Use  . Smoking status: Never Smoker  .  Smokeless tobacco: Never Used  Substance Use Topics  . Alcohol use: Yes    Comment: "occasional" when "hanging out with the wrong people" No recent use.  . Drug use: No    Types: Marijuana    Comment: Last in 2013    Outpatient Medications Prior to Visit  Medication Sig Dispense Refill  . atorvastatin (LIPITOR) 40 MG tablet Take 1 tablet (40 mg total) by mouth daily at 6 PM. 30 tablet 0  . carvedilol (COREG) 6.25 MG tablet Take 1.5 tablets (9.375 mg total) by mouth 2 (two) times daily with a meal. 90 tablet 0  . glipiZIDE (GLUCOTROL) 10 MG tablet Take 1 tablet (10 mg total) by mouth 2 (two)  times daily before a meal. 60 tablet 0  . insulin aspart (NOVOLOG) 100 UNIT/ML injection Inject 10-20 Units into the skin 3 (three) times daily with meals. 3 vial 0  . insulin detemir (LEVEMIR) 100 UNIT/ML injection Inject 0.2 mLs (20 Units total) into the skin at bedtime. 30 mL 0  . Insulin Syringes, Disposable, U-100 1 ML MISC Insulin syringes 30-gauge 100 each 12  . metFORMIN (GLUCOPHAGE) 1000 MG tablet TAKE 1 TABLET BY MOUTH 2 TIMES DAILY WITH A MEAL 60 tablet 0  . nitroGLYCERIN (NITROSTAT) 0.4 MG SL tablet Place 1 tablet (0.4 mg total) under the tongue every 5 (five) minutes as needed for chest pain. (Patient not taking: Reported on 04/17/2016) 25 tablet 2  . Omega-3 Fatty Acids (FISH OIL) 1000 MG CPDR Take 2,000 mg by mouth daily. 60 capsule 0  . sacubitril-valsartan (ENTRESTO) 49-51 MG Take 1 tablet by mouth 2 (two) times daily. 180 tablet 3   No facility-administered medications prior to visit.     No Known Allergies  ROS Review of Systems  Constitutional: Negative for chills, fatigue and fever.  HENT: Negative for hearing loss and trouble swallowing.   Eyes: Negative for photophobia and visual disturbance.  Respiratory: Negative for cough and shortness of breath.   Cardiovascular: Negative for chest pain, palpitations and leg swelling.  Gastrointestinal: Negative for abdominal pain, blood in stool, constipation, diarrhea and nausea.  Endocrine: Positive for polydipsia and polyuria. Negative for polyphagia.  Genitourinary: Positive for frequency. Negative for dysuria.  Musculoskeletal: Negative for arthralgias, back pain (occasional low back pain but not recently) and gait problem.  Neurological: Negative for dizziness, numbness and headaches.  Hematological: Negative for adenopathy. Does not bruise/bleed easily.      Objective:    Physical Exam  Constitutional: He is oriented to person, place, and time. He appears well-developed and well-nourished.  Patient is accompanied by  his wife at today's visit  HENT:  Nose: Nose normal.  Mouth/Throat: Oropharynx is clear and moist.  Eyes: Conjunctivae and EOM are normal.  Neck: Normal range of motion. Neck supple. No thyromegaly present.  Cardiovascular: Normal rate and regular rhythm.  No carotid bruit  Pulmonary/Chest: Effort normal and breath sounds normal. No respiratory distress. He has no rales.  Abdominal: Soft. There is no abdominal tenderness. There is no rebound and no guarding.  Musculoskeletal: Normal range of motion.        General: No tenderness or edema.  Lymphadenopathy:    He has no cervical adenopathy.  Neurological: He is alert and oriented to person, place, and time. No sensory deficit.  Normal monofilament exam  Skin: Skin is warm and dry.  No active skin breakdown on the feet  Psychiatric: He has a normal mood and affect. His behavior is normal. Judgment  and thought content normal.  Nursing note and vitals reviewed. Sensory exam of the foot is normal, tested with the monofilament. Good pulses, no lesions or ulcers, good peripheral pulses.  BP 119/84 (BP Location: Left Arm, Patient Position: Sitting, Cuff Size: Normal)   Pulse 81   Temp 98.9 F (37.2 C) (Oral)   Resp 18   Ht _0  (1.803 m)   Wt 182 lb (82.6 kg)   SpO2 98%   BMI 25.38 kg/m  nurse's notes and vital signs reviewed  Wt Readings from Last 3 Encounters:  08/12/17 190 lb 3.2 oz (86.3 kg)  05/14/17 190 lb 9.6 oz (86.5 kg)  03/04/17 180 lb (81.6 kg)     Health Maintenance Due  Topic Date Due  . FOOT EXAM  08/19/1993  . OPHTHALMOLOGY EXAM  07/24/2017  . INFLUENZA VACCINE  11/13/2017  . HEMOGLOBIN A1C  02/11/2018  . URINE MICROALBUMIN  08/13/2018      Lab Results  Component Value Date   TSH 0.821 02/03/2013   Lab Results  Component Value Date   WBC 8.1 03/04/2017   HGB 14.2 03/04/2017   HCT 41.7 03/04/2017   MCV 85.1 03/04/2017   PLT 262 03/04/2017   Lab Results  Component Value Date   NA 139 03/04/2017     K 3.4 (L) 03/04/2017   CO2 28 03/04/2017   GLUCOSE 81 03/04/2017   BUN 15 03/04/2017   CREATININE 0.84 03/04/2017   BILITOT 0.8 03/04/2017   ALKPHOS 63 03/04/2017   AST 27 03/04/2017   ALT 24 03/04/2017   PROT 7.1 03/04/2017   ALBUMIN 3.8 03/04/2017   CALCIUM 9.2 03/04/2017   ANIONGAP 6 03/04/2017   Lab Results  Component Value Date   CHOL 212 (H) 11/06/2016   Lab Results  Component Value Date   HDL 43 11/06/2016   Lab Results  Component Value Date   LDLCALC 141 (H) 11/06/2016   Lab Results  Component Value Date   TRIG 141 11/06/2016   Lab Results  Component Value Date   CHOLHDL 4.9 11/06/2016   Lab Results  Component Value Date   HGBA1C 11.6 08/12/2017      Assessment & Plan:  1. Type 2 diabetes mellitus without complication, with long-term current use of insulin ([place]) On review of chart, patient's last hemoglobin A1c was 11.6 in April 2019.  Patient admits to continued noncompliance on a daily basis with the use of insulin and other medications for the treatment of diabetes.  Patient reports that he did take his insulin last night and metformin as he knew he would be coming to today's visit.  Patient reports that he is supposed to be taking Lantus 20 units twice daily.  I discussed with the patient that I would like him to take 30 units once daily and he may take this either in the morning or evening, which ever time of [place] would help him increase his compliance.  Patient also given refills of metformin.  Patient also has previously been prescribed glipizide and patient was asked to resume use of this medication.  Patient also given prescription for new diabetic testing supplies.  Patient should call or return if he has issues with hypoglycemia so that his medications can be further adjusted.  Discussed with the patient the importance of letting providers know which medicines he may not be taking on a regular basis in order to avoid being unnecessarily prescribed  increased doses or additional medications which may then result in  hypoglycemia or other complications once patient does resume regular use of his medications.  Also discussed importance of healthy diet, regular cardiovascular exercise as well as diabetic foot exam, yearly eye exam and patient was also offered influenza immunization but was not sure if he wanted to receive the immunization during his visit.  Patient was also asked to keep a blood sugar diary and return in the next 2 to 3 weeks to meet with the clinical pharmacist to further adjust his medications. - Comprehensive metabolic panel - Lipid panel - Hemoglobin A1c - Microalbumin/Creatinine Ratio, Urine - metFORMIN (GLUCOPHAGE) 1000 MG tablet; TAKE 1 TABLET BY MOUTH 2 TIMES DAILY WITH A MEAL  Dispense: 60 tablet; Refill: 0 - glipiZIDE (GLUCOTROL) 10 MG tablet; Take 1 tablet (10 mg total) by mouth 2 (two) times daily before a meal. To lower blood sugar  Dispense: 60 tablet; Refill: 4 - Insulin Glargine (LANTUS SOLOSTAR) 100 UNIT/ML Solostar Pen; Inject 30 Units into the skin daily.  Dispense: 5 pen; Refill: 6   3. Non-ischemic cardiomyopathy ([place]) Patient with nonischemic cardiomyopathy and chronic systolic congestive heart failure which appears to be stable at this time.  Patient will be referred back to cardiology for further evaluation and treatment.  Patient provided with refill of Entresto. - Ambulatory referral to Cardiology - sacubitril-valsartan (ENTRESTO) 49-51 MG; Take 1 tablet by mouth 2 (two) times daily.  Dispense: 180 tablet; Refill: 3  4. Chronic systolic congestive heart failure ([facility]) Patient's systolic congestive heart failure appears to be stable at this time.  Patient reports no increased shortness of breath, patient has no peripheral edema and patient with well-controlled blood pressure at today's visit.  Patient did not have any rales, peripheral edema or JVD on physical examination. - Ambulatory referral to  Cardiology  5. Essential hypertension Patient's blood pressure is well controlled on his current regimen of medications and patient is encouraged to remain compliant.  Patient will have lipid panel and microalbumin/creatinine ratio done at today's visit.  Patient will also be referred back to cardiology in follow-up of hypertension and CHF. - Lipid panel - Microalbumin/Creatinine Ratio, Urine - Ambulatory referral to Cardiology  6. Encounter for long-term (current) use of medication Complete metabolic panel will be done in follow-up of long-term use of medications some of which may be considered high risk as they can affect patient's renal function and liver function.  Patient is also additionally on medication which may cause hyperkalemia. - Comprehensive metabolic panel  7. Hyperlipidemia, unspecified hyperlipidemia type Patient will have lipid panel done at today's visit in follow-up of hyperlipidemia.  Patient was previously on atorvastatin which was refilled but patient admits to noncompliance with medication and diet.  Patient will be notified regarding recommendations for treatment of hyperlipidemia based on today's labs.  8. Uncontrolled type 2 diabetes mellitus without complication, with long-term current use of insulin ([place]) Patient is encouraged to continue to remain compliant with the use of both long-acting and short-acting insulin.  Patient likely will need less short acting insulin once he is compliant with oral medications as well as long-acting insulin on a daily basis. - insulin aspart (NOVOLOG) 100 UNIT/ML injection; Inject 10-20 Units into the skin 3 (three) times daily with meals.  Dispense: 3 vial; Refill: 0  9.  Noncompliance with medication treatment due to intermittent use of medication Discussed with the patient in the presence of his wife the importance of making sure that he notifies/make provider aware when he is not taking medication as prescribed as  additional  medication could then be ordered for the patient resulting in hypoglycemia or hypotension depending on condition being treated.  Patient's medicines were modified at today's visit but patient encouraged to call if he is having issues with low glucose after restarting his medications on a daily basis.  An After Visit Summary was printed and given to the patient.  Allergies as of 07/02/2018   No Known Allergies     Medication List       Accurate as of July 02, 2018 11:59 PM. Always use your most recent med list.        atorvastatin 40 MG tablet Commonly known as:  LIPITOR Take 1 tablet (40 mg total) by mouth daily at 6 PM. To lower cholesterol   carvedilol 6.25 MG tablet Commonly known as:  COREG Take 1.5 tablets (9.375 mg total) by mouth 2 (two) times daily with a meal.   Fish Oil 1000 MG Cpdr Take 2,000 mg by mouth daily.   glipiZIDE 10 MG tablet Commonly known as:  GLUCOTROL Take 1 tablet (10 mg total) by mouth 2 (two) times daily before a meal. To lower blood sugar   glucose blood test strip Use as instructed   insulin aspart 100 UNIT/ML injection Commonly known as:  novoLOG Inject 10-20 Units into the skin 3 (three) times daily with meals.   insulin detemir 100 UNIT/ML injection Commonly known as:  Levemir Inject 0.2 mLs (20 Units total) into the skin at bedtime.   Insulin Glargine 100 UNIT/ML Solostar Pen Commonly known as:  Lantus SoloStar Inject 30 Units into the skin daily.   Insulin Syringes (Disposable) U-100 1 ML Misc Insulin syringes 30-gauge   metFORMIN 1000 MG tablet Commonly known as:  GLUCOPHAGE TAKE 1 TABLET BY MOUTH 2 TIMES DAILY WITH A MEAL   nitroGLYCERIN 0.4 MG SL tablet Commonly known as:  NITROSTAT Place 1 tablet (0.4 mg total) under the tongue every 5 (five) minutes as needed for chest pain.   sacubitril-valsartan 49-51 MG Commonly known as:  Entresto Take 1 tablet by mouth 2 (two) times daily.   True Metrix Meter w/Device Kit 1 each  by Does not apply route 3 (three) times daily.       Follow-up: Return in about 3 months (around 10/02/2018) for 2-3 weeks with Lurena Joiner; 3 months with PCP.   Antony Blackbird, MD

## 2018-07-03 LAB — COMPREHENSIVE METABOLIC PANEL WITH GFR
ALT: 21 IU/L (ref 0–44)
AST: 21 IU/L (ref 0–40)
Albumin/Globulin Ratio: 1.6 (ref 1.2–2.2)
Albumin: 4.4 g/dL (ref 4.0–5.0)
Alkaline Phosphatase: 66 IU/L (ref 39–117)
BUN/Creatinine Ratio: 19 (ref 9–20)
BUN: 17 mg/dL (ref 6–20)
Bilirubin Total: 0.3 mg/dL (ref 0.0–1.2)
CO2: 21 mmol/L (ref 20–29)
Calcium: 9.6 mg/dL (ref 8.7–10.2)
Chloride: 99 mmol/L (ref 96–106)
Creatinine, Ser: 0.89 mg/dL (ref 0.76–1.27)
GFR calc Af Amer: 129 mL/min/1.73
GFR calc non Af Amer: 112 mL/min/1.73
Globulin, Total: 2.7 g/dL (ref 1.5–4.5)
Glucose: 360 mg/dL — ABNORMAL HIGH (ref 65–99)
Potassium: 4.6 mmol/L (ref 3.5–5.2)
Sodium: 138 mmol/L (ref 134–144)
Total Protein: 7.1 g/dL (ref 6.0–8.5)

## 2018-07-03 LAB — HEMOGLOBIN A1C
Est. average glucose Bld gHb Est-mCnc: 309 mg/dL
Hgb A1c MFr Bld: 12.4 % — ABNORMAL HIGH (ref 4.8–5.6)

## 2018-07-03 LAB — LIPID PANEL
Chol/HDL Ratio: 7.4 ratio — ABNORMAL HIGH (ref 0.0–5.0)
Cholesterol, Total: 272 mg/dL — ABNORMAL HIGH (ref 100–199)
HDL: 37 mg/dL — ABNORMAL LOW
LDL Calculated: 191 mg/dL — ABNORMAL HIGH (ref 0–99)
Triglycerides: 218 mg/dL — ABNORMAL HIGH (ref 0–149)
VLDL Cholesterol Cal: 44 mg/dL — ABNORMAL HIGH (ref 5–40)

## 2018-07-03 LAB — MICROALBUMIN / CREATININE URINE RATIO
Creatinine, Urine: 94.2 mg/dL
Microalb/Creat Ratio: <3 mg/g{creat} (ref 0–29)
Microalbumin, Urine: <3 ug/mL

## 2018-07-05 ENCOUNTER — Encounter: Payer: Self-pay | Admitting: Family Medicine

## 2018-07-05 ENCOUNTER — Other Ambulatory Visit: Payer: Self-pay | Admitting: Family Medicine

## 2018-07-05 DIAGNOSIS — E1169 Type 2 diabetes mellitus with other specified complication: Secondary | ICD-10-CM

## 2018-07-05 DIAGNOSIS — E785 Hyperlipidemia, unspecified: Principal | ICD-10-CM

## 2018-07-05 MED ORDER — ROSUVASTATIN CALCIUM 20 MG PO TABS: 20.0000 mg | ORAL_TABLET | Freq: Every day | ORAL | 5 refills | Status: DC

## 2018-07-05 NOTE — Progress Notes (Signed)
Patient ID: Jason Stevenson, male   DOB: Feb 08, 1986, 33 y.o.   MRN: [id number]   Patient with recent lipid panel with greatly elevated bad cholesterol of 191 along with high TG and low HDL consistent with dyslipidemia. Patient is currently been prescribed atorvastatin but admitted at his recent visit that he is not always compliant with his medication. Patient will be contacted with his lab results and a new cholesterol medication, [facility]

## 2018-07-08 ENCOUNTER — Encounter: Payer: Self-pay | Admitting: Internal Medicine

## 2018-07-08 ENCOUNTER — Encounter: Payer: Self-pay | Admitting: Family Medicine

## 2018-07-08 ENCOUNTER — Other Ambulatory Visit: Payer: Self-pay | Admitting: Pharmacist

## 2018-07-08 ENCOUNTER — Telehealth: Payer: Self-pay | Admitting: Pharmacist

## 2018-07-08 DIAGNOSIS — I5022 Chronic systolic (congestive) heart failure: Secondary | ICD-10-CM

## 2018-07-08 DIAGNOSIS — I10 Essential (primary) hypertension: Secondary | ICD-10-CM

## 2018-07-08 DIAGNOSIS — IMO0001 Reserved for inherently not codable concepts without codable children: Secondary | ICD-10-CM

## 2018-07-08 DIAGNOSIS — E1165 Type 2 diabetes mellitus with hyperglycemia: Secondary | ICD-10-CM

## 2018-07-08 DIAGNOSIS — E1169 Type 2 diabetes mellitus with other specified complication: Secondary | ICD-10-CM

## 2018-07-08 DIAGNOSIS — E119 Type 2 diabetes mellitus without complications: Secondary | ICD-10-CM

## 2018-07-08 DIAGNOSIS — Z794 Long term (current) use of insulin: Secondary | ICD-10-CM

## 2018-07-08 DIAGNOSIS — E785 Hyperlipidemia, unspecified: Secondary | ICD-10-CM

## 2018-07-08 DIAGNOSIS — I428 Other cardiomyopathies: Secondary | ICD-10-CM

## 2018-07-08 MED ORDER — GLIPIZIDE 10 MG PO TABS: 10.0000 mg | ORAL_TABLET | Freq: Two times a day (BID) | ORAL | 4 refills | Status: DC

## 2018-07-08 MED ORDER — SACUBITRIL-VALSARTAN 49-51 MG PO TABS: 1.0000 | ORAL_TABLET | Freq: Two times a day (BID) | ORAL | 0 refills | Status: DC

## 2018-07-08 MED ORDER — CARVEDILOL 6.25 MG PO TABS: 9.3750 mg | ORAL_TABLET | Freq: Two times a day (BID) | ORAL | 0 refills | Status: DC

## 2018-07-08 MED ORDER — ROSUVASTATIN CALCIUM 20 MG PO TABS: 20.0000 mg | ORAL_TABLET | Freq: Every day | ORAL | 0 refills | Status: DC

## 2018-07-08 MED ORDER — "INSULIN SYRINGE-NEEDLE U-100 31G X 5/16"" 0.3 ML MISC": 11 refills | Status: DC

## 2018-07-08 MED ORDER — METFORMIN HCL 1000 MG PO TABS: ORAL_TABLET | ORAL | 0 refills | Status: DC

## 2018-07-08 MED ORDER — INSULIN DETEMIR 100 UNIT/ML ~~LOC~~ SOLN: 30.0000 [IU] | Freq: Every day | SUBCUTANEOUS | 0 refills | Status: DC

## 2018-07-08 MED ORDER — FISH OIL 1000 MG PO CPDR: 2000.0000 mg | DELAYED_RELEASE_CAPSULE | Freq: Every day | ORAL | 0 refills | Status: DC

## 2018-07-08 MED ORDER — INSULIN LISPRO 100 UNIT/ML ~~LOC~~ SOLN: SUBCUTANEOUS | 0 refills | Status: DC

## 2018-07-08 NOTE — Progress Notes (Signed)
Patient ID: Jason Stevenson, male   DOB: 06/28/1985, 33 y.o.   MRN: [id number]   Patient left phone message requesting letter stating that because he is diabetic he is more likely to catch infections such as COVID-19.

## 2018-07-08 NOTE — Telephone Encounter (Signed)
Spoke w/ patient to verify address. He is okay to keep his prescriptions at [facility] for mail-order. Verified address in our rx30 software.

## 2018-07-08 NOTE — Telephone Encounter (Signed)
Letter typed and printed, please contact patient to see if he would like for you to mail the letter or scan to patient's email if this is allowed

## 2018-07-08 NOTE — Progress Notes (Signed)
Patient's insurance prefers for him to fill at [facility] pharmacy. If he fills with [place], he is responsible for 100% of the cost. However, I will send these to [facility] so he will not be responsible to full charge.

## 2018-07-08 NOTE — Telephone Encounter (Signed)
Addendum: patient's insurance prefers for him to fill at [facility] for coverage. Will send prescriptions to [facility].

## 2018-07-13 ENCOUNTER — Other Ambulatory Visit: Payer: Self-pay | Admitting: Pharmacist

## 2018-07-13 MED ORDER — "INSULIN SYRINGE-NEEDLE U-100 31G X 15/64"" 0.3 ML MISC": 11 refills | Status: AC

## 2018-07-16 ENCOUNTER — Ambulatory Visit: Payer: Self-pay | Admitting: Pharmacist

## 2018-08-24 NOTE — Progress Notes (Signed)
Virtual Visit via Video Note   This visit type was conducted due to national recommendations for restrictions regarding the COVID-19 Pandemic (e.g. social distancing) in an effort to limit this patient's exposure and mitigate transmission in our community.  Due to his co-morbid illnesses, this patient is at least at moderate risk for complications without adequate follow up.  This format is felt to be most appropriate for this patient at this time.  All issues noted in this document were discussed and addressed.  A limited physical exam was performed with this format.  Please refer to the patient's chart for his consent to telehealth for [facility].   Date:  08/25/2018   ID:  Jason Stevenson, DOB 10-04-1985, MRN [id number]  Patient Location: Home Provider Location: Home  PCP:  Ladell Pier, MD  Cardiologist:  Jenkins Rouge, MD   Electrophysiologist:  None   Evaluation Performed:  Follow-Up Visit  Chief Complaint:  f/u  History of Present Illness:    Jason Stevenson is a 33 y.o. male with history of NICM on cardiac MRI 1/208-no late gadolinium enhancement, HTN, HLD, DM, Coronary CTA 2014-Calcium score 0,no CAD, last echo 08/2015 EF 15-20%. Was followed in CHF clinic for awhile. Last saw Dr. Johnsie Cancel 02/2016. Dr. Aundra Dubin had recommended ICD if EF remained low when last seen 08/07/15 but no f/u since.   Labs 07/02/18 LDL 191 chol 272, trig 218 A1C12.4 Crt 0.82 BNP 62.-wasn't taking meds.  Patient hasn't been compliant with meds. Has been taking entresto better than other meds. Denies chest pain, dyspnea, dyspnea on exertion, edema. Works for [employer] from home. No regular exercise. Sometimes feels "achy in his chest" at random times.After carrying a TV into the house he felt worn out.  The patient does not have symptoms concerning for COVID-19 infection (fever, chills, cough, or new shortness of breath).    Past Medical History:  Diagnosis Date  . Chronic systolic CHF (congestive  heart failure) ([place]) 06/18/2015  . Diabetes mellitus without complication ([place])   . Hyperlipidemia   . Hypertension   . Nonischemic cardiomyopathy ([place]) Noted as early as 2007   Per chart review (cards consult note 2011), EF of 40% in 2007, down to 20-25% in 2011   Past Surgical History:  Procedure Laterality Date  . None       Current Meds  Medication Sig  . Blood Glucose Monitoring Suppl (TRUE METRIX METER) w/Device KIT 1 each by Does not apply route 3 (three) times daily.  . carvedilol (COREG) 6.25 MG tablet Take 1.5 tablets (9.375 mg total) by mouth 2 (two) times daily with a meal.  . glipiZIDE (GLUCOTROL) 10 MG tablet Take 1 tablet (10 mg total) by mouth 2 (two) times daily before a meal. To lower blood sugar  . glucose blood test strip Use as instructed  . insulin detemir (LEVEMIR) 100 UNIT/ML injection Inject 0.3 mLs (30 Units total) into the skin at bedtime.  . insulin lispro (HUMALOG) 100 UNIT/ML injection Inject 10-20 Units into the skin 3 (three) times daily with meals.  . Insulin Syringe-Needle U-100 (RELION INSULIN SYRINGE) 31G X 15/64" 0.3 ML MISC Use to inject insulin daily.  . metFORMIN (GLUCOPHAGE) 1000 MG tablet TAKE 1 TABLET BY MOUTH 2 TIMES DAILY WITH A MEAL  . nitroGLYCERIN (NITROSTAT) 0.4 MG SL tablet Place 1 tablet (0.4 mg total) under the tongue every 5 (five) minutes as needed for chest pain.  . Omega-3 Fatty Acids (FISH OIL) 1000 MG CPDR Take 2,000 mg  by mouth daily.  . rosuvastatin (CRESTOR) 20 MG tablet Take 1 tablet (20 mg total) by mouth daily. To lower cholesterol  . sacubitril-valsartan (ENTRESTO) 49-51 MG Take 1 tablet by mouth 2 (two) times daily.     Allergies:   Patient has no known allergies.   Social History   Tobacco Use  . Smoking status: Never Smoker  . Smokeless tobacco: Never Used  Substance Use Topics  . Alcohol use: Yes    Comment: "occasional" when "hanging out with the wrong people" No recent use.  . Drug use: No    Types: Marijuana     Comment: Last in 2013     Family Hx: The patient's family history includes Diabetes in his mother; Hypertension in his mother; Stroke in his maternal aunt. There is no history of Heart attack.  ROS:   Please see the history of present illness.    Review of Systems  Constitution: Positive for malaise/fatigue.  HENT: Negative.   Cardiovascular: Negative.   Respiratory: Negative.   Endocrine: Negative.   Hematologic/Lymphatic: Negative.   Musculoskeletal: Negative.   Gastrointestinal: Negative.   Genitourinary: Positive for decreased libido.  Neurological: Negative.     All other systems reviewed and are negative.   Prior CV studies:   The following studies were reviewed today: Echo 5/2017Study Conclusions   - Left ventricle: The cavity size was moderately dilated. Wall   thickness was normal. Systolic function was severely reduced. The   estimated ejection fraction was in the range of 15 to 20%. - Mitral valve: There was mild regurgitation. - Left atrium: The atrium was mildly dilated. - Right ventricle: Systolic function was mildly reduced.   Cardiac CTA 2014 Findings:   Calcium Score: 0   No significant non cardiac findings   Coronary Areteries: Right dominant with no anomalies   LM- normal   LAD- normal         D1: normal         D2: normal   Circumflex- normal         OM1: normal   RCA- dominant some misregistration artifact normal         PDA- normal         PLA- normal   Impression:      1)    Calcium Score 0        2)    Normal right dominant coronary arteries Jenkins Rouge MD [facility]     Original Report Authenticated By: Rolm Baptise, M.D.      Cardiac MRI 2008IMPRESSION:  1.   Calculated ejection fraction 45%. 2.   No hyperenhancement indicating likely nonischemic cardiomyopathy. 3.   No significant noncardiac findings. The study should be billed by [facility] as [phone] cardiac MRI with gadolinium function and morphology done  in conjunction with Dr. Chauncey Cruel from [facility].     Labs/Other Tests and Data Reviewed:    EKG:  An ECG dated 02/20/15 was personally reviewed today and demonstrated:  NSR with Right axis and inf Q waves TWI inf/lat  Recent Labs: 07/02/2018: ALT 21; BUN 17; Creatinine, Ser 0.89; Potassium 4.6; Sodium 138   Recent Lipid Panel Lab Results  Component Value Date/Time   CHOL 272 (H) 07/02/2018 11:15 AM   TRIG 218 (H) 07/02/2018 11:15 AM   HDL 37 (L) 07/02/2018 11:15 AM   CHOLHDL 7.4 (H) 07/02/2018 11:15 AM   CHOLHDL 7.8 (H) 04/17/2016 12:04 PM   LDLCALC 191 (H) 07/02/2018 11:15 AM  Wt Readings from Last 3 Encounters:  08/25/18 186 lb (84.4 kg)  07/02/18 182 lb (82.6 kg)  08/12/17 190 lb 3.2 oz (86.3 kg)     Objective:    Vital Signs:  Ht _0  (1.803 m)   Wt 186 lb (84.4 kg)   BMI 25.94 kg/m    VITAL SIGNS:  reviewed GEN:  no acute distress RESPIRATORY:  normal respiratory effort, symmetric expansion CARDIOVASCULAR:  no peripheral edema  ASSESSMENT & PLAN:    1. NICM EF 15-20% echo 08/2015-Dr. McLean recommended ICD but patient hasn't f/u. Somewhat compliant with entresto. Will arrange for echo this summer 2. Chronic systolic CHF-compensated.  3. Essential HTN BP well controlled at visit 06/2018 4. HLD wasn't taking meds and Lipids as above. Repeat in July 5. DM type 2 A1C>12-still not compliant with insulin-doesn't like  6. Erectile dysfunction most likely related to diabetes. F/u with PCP  COVID-19 Education: The signs and symptoms of COVID-19 were discussed with the patient and how to seek care for testing (follow up with PCP or arrange E-visit).   The importance of social distancing was discussed today.  Time:   Today, I have spent 16:08 minutes with the patient with telehealth technology discussing the above problems.     Medication Adjustments/Labs and Tests Ordered: Current medicines are reviewed at length with the patient today.  Concerns  regarding medicines are outlined above.   Tests Ordered: Orders Placed This Encounter  Procedures  . Hepatic function panel  . Lipid panel  . ECHOCARDIOGRAM COMPLETE    Medication Changes: No orders of the defined types were placed in this encounter.   Disposition:  Follow up in 3 month(s) Dr. Johnsie Cancel   Signed, Ermalinda Barrios, PA-C  08/25/2018 10:34 AM    [place]

## 2018-08-25 ENCOUNTER — Telehealth: Payer: Self-pay

## 2018-08-25 ENCOUNTER — Telehealth (INDEPENDENT_AMBULATORY_CARE_PROVIDER_SITE_OTHER): Payer: Self-pay | Admitting: Physician Assistant

## 2018-08-25 ENCOUNTER — Other Ambulatory Visit: Payer: Self-pay

## 2018-08-25 ENCOUNTER — Encounter: Payer: Self-pay | Admitting: Physician Assistant

## 2018-08-25 VITALS — Ht 71.0 in | Wt 186.0 lb

## 2018-08-25 DIAGNOSIS — E119 Type 2 diabetes mellitus without complications: Secondary | ICD-10-CM

## 2018-08-25 DIAGNOSIS — I10 Essential (primary) hypertension: Secondary | ICD-10-CM

## 2018-08-25 DIAGNOSIS — E785 Hyperlipidemia, unspecified: Secondary | ICD-10-CM

## 2018-08-25 DIAGNOSIS — Z794 Long term (current) use of insulin: Secondary | ICD-10-CM

## 2018-08-25 DIAGNOSIS — I5022 Chronic systolic (congestive) heart failure: Secondary | ICD-10-CM

## 2018-08-25 DIAGNOSIS — I428 Other cardiomyopathies: Secondary | ICD-10-CM

## 2018-08-25 NOTE — Telephone Encounter (Signed)
Left message for patient to call back regarding his appointment today. Need to document consent for appointment.

## 2018-08-25 NOTE — Telephone Encounter (Signed)
Virtual Visit Pre-Appointment Phone Call  "(Name), I am calling you today to discuss your upcoming appointment. We are currently trying to limit exposure to the virus that causes COVID-19 by seeing patients at home rather than in the office."  1. "What is the BEST phone number to call the day of the visit?" - include this in appointment notes  2. "Do you have or have access to (through a family member/friend) a smartphone with video capability that we can use for your visit?" a. If yes - list this number in appt notes as "cell" (if different from BEST phone #) and list the appointment type as a VIDEO visit in appointment notes b. If no - list the appointment type as a PHONE visit in appointment notes  3. Confirm consent - "In the setting of the current Covid19 crisis, you are scheduled for a (phone or video) visit with your provider on (date) at (time).  Just as we do with many in-office visits, in order for you to participate in this visit, we must obtain consent.  If you'd like, I can send this to your mychart (if signed up) or email for you to review.  Otherwise, I can obtain your verbal consent now.  All virtual visits are billed to your insurance company just like a normal visit would be.  By agreeing to a virtual visit, we'd like you to understand that the technology does not allow for your provider to perform an examination, and thus may limit your provider's ability to fully assess your condition. If your provider identifies any concerns that need to be evaluated in person, we will make arrangements to do so.  Finally, though the technology is pretty good, we cannot assure that it will always work on either your or our end, and in the setting of a video visit, we may have to convert it to a phone-only visit.  In either situation, we cannot ensure that we have a secure connection.  Are you willing to proceed?" STAFF: Did the patient verbally acknowledge consent to telehealth visit? yes  4.  Advise patient to be prepared - "Two hours prior to your appointment, go ahead and check your blood pressure, pulse, oxygen saturation, and your weight (if you have the equipment to check those) and write them all down. When your visit starts, your provider will ask you for this information. If you have an Apple Watch or Kardia device, please plan to have heart rate information ready on the day of your appointment. Please have a pen and paper handy nearby the day of the visit as well."  5. Give patient instructions for MyChart download to smartphone OR Doximity/Doxy.me as below if video visit (depending on what platform provider is using)  6. Inform patient they will receive a phone call 15 minutes prior to their appointment time (may be from unknown caller ID) so they should be prepared to answer    TELEPHONE CALL NOTE  Jason Stevenson has been deemed a candidate for a follow-up tele-health visit to limit community exposure during the Covid-19 pandemic. I spoke with the patient via phone to ensure availability of phone/video source, confirm preferred email & phone number, and discuss instructions and expectations.  I reminded Jason Stevenson to be prepared with any vital sign and/or heart rhythm information that could potentially be obtained via home monitoring, at the time of his visit. I reminded Jason Stevenson to expect a phone call prior to his visit.  Clide Dales Shyne Lehrke,  CMA 08/25/2018 9:38 AM   INSTRUCTIONS FOR DOWNLOADING THE MYCHART APP TO SMARTPHONE  - The patient must first make sure to have activated MyChart and know their login information - If Apple, go to [employer] and type in MyChart in the search bar and download the app. If Android, ask patient to go to [employer] and type in [place] in the search bar and download the app. The app is free but as with any other app downloads, their phone may require them to verify saved payment information or Apple/Android password.   - The patient will need to then log into the app with their MyChart username and password, and select [facility] as their healthcare provider to link the account. When it is time for your visit, go to the MyChart app, find appointments, and click Begin Video Visit. Be sure to Select Allow for your device to access the Microphone and Camera for your visit. You will then be connected, and your provider will be with you shortly.  **If they have any issues connecting, or need assistance please contact MyChart service desk [phone])**  **If using a computer, in order to ensure the best quality for their visit they will need to use either of the following Internet Browsers: [employer] or Google Chrome**  IF USING DOXIMITY or DOXY.ME - The patient will receive a link just prior to their visit by text.     FULL LENGTH CONSENT FOR TELE-HEALTH VISIT   I hereby voluntarily request, consent and authorize [facility] and its employed or contracted physicians, physician assistants, nurse practitioners or other licensed health care professionals (the Practitioner), to provide me with telemedicine health care services (the "Services") as deemed necessary by the treating Practitioner. I acknowledge and consent to receive the Services by the Practitioner via telemedicine. I understand that the telemedicine visit will involve communicating with the Practitioner through live audiovisual communication technology and the disclosure of certain medical information by electronic transmission. I acknowledge that I have been given the opportunity to request an in-person assessment or other available alternative prior to the telemedicine visit and am voluntarily participating in the telemedicine visit.  I understand that I have the right to withhold or withdraw my consent to the use of telemedicine in the course of my care at any time, without affecting my right to future care or treatment, and that  the Practitioner or I may terminate the telemedicine visit at any time. I understand that I have the right to inspect all information obtained and/or recorded in the course of the telemedicine visit and may receive copies of available information for a reasonable fee.  I understand that some of the potential risks of receiving the Services via telemedicine include:  Marland Kitchen Delay or interruption in medical evaluation due to technological equipment failure or disruption; . Information transmitted may not be sufficient (e.g. poor resolution of images) to allow for appropriate medical decision making by the Practitioner; and/or  . In rare instances, security protocols could fail, causing a breach of personal health information.  Furthermore, I acknowledge that it is my responsibility to provide information about my medical history, conditions and care that is complete and accurate to the best of my ability. I acknowledge that Practitioner's advice, recommendations, and/or decision may be based on factors not within their control, such as incomplete or inaccurate data provided by me or distortions of diagnostic images or specimens that may result from electronic transmissions. I understand that the practice of  medicine is not an Chief Strategy Officer and that Practitioner makes no warranties or guarantees regarding treatment outcomes. I acknowledge that I will receive a copy of this consent concurrently upon execution via email to the email address I last provided but may also request a printed copy by calling the office of [facility].    I understand that my insurance will be billed for this visit.   I have read or had this consent read to me. . I understand the contents of this consent, which adequately explains the benefits and risks of the Services being provided via telemedicine.  . I have been provided ample opportunity to ask questions regarding this consent and the Services and have had my questions answered to  my satisfaction. . I give my informed consent for the services to be provided through the use of telemedicine in my medical care  By participating in this telemedicine visit I agree to the above.

## 2018-08-25 NOTE — Patient Instructions (Signed)
Medication Instructions:  Your physician recommends that you continue on your current medications as directed. Please refer to the Current Medication list given to you today.  If you need a refill on your cardiac medications before your next appointment, please call your pharmacy.   Lab work: Your physician recommends that you return for a FASTING lipid profile and liver function panel on 10/19/18  If you have labs (blood work) drawn today and your tests are completely normal, you will receive your results only by: Marland Kitchen MyChart Message (if you have MyChart) OR . A paper copy in the mail If you have any lab test that is abnormal or we need to change your treatment, we will call you to review the results.  Testing/Procedures: Your physician has requested that you have an echocardiogram on 10/19/18 at 8:30 AM. Echocardiography is a painless test that uses sound waves to create images of your heart. It provides your doctor with information about the size and shape of your heart and how well your heart's chambers and valves are working. This procedure takes approximately one hour. There are no restrictions for this procedure.    Follow-Up: . Follow up with Dr. Eden Emms on 12/04/18 at 8:00 AM  Any Other Special Instructions Will Be Listed Below (If Applicable).

## 2018-08-27 NOTE — Telephone Encounter (Signed)
Patient completed virtual appt on 5/12 and consent was documented prior to visit.

## 2018-09-13 ENCOUNTER — Other Ambulatory Visit: Payer: Self-pay | Admitting: Internal Medicine

## 2018-09-13 DIAGNOSIS — I10 Essential (primary) hypertension: Secondary | ICD-10-CM

## 2018-09-13 DIAGNOSIS — I5022 Chronic systolic (congestive) heart failure: Secondary | ICD-10-CM

## 2018-09-14 MED ORDER — CARVEDILOL 6.25 MG PO TABS: 9.3750 mg | ORAL_TABLET | Freq: Two times a day (BID) | ORAL | 0 refills | Status: DC

## 2018-10-03 ENCOUNTER — Encounter: Payer: Self-pay | Admitting: Family Medicine

## 2018-10-03 ENCOUNTER — Telehealth: Payer: Self-pay

## 2018-10-03 ENCOUNTER — Encounter: Payer: Self-pay | Admitting: Internal Medicine

## 2018-10-05 ENCOUNTER — Encounter (HOSPITAL_COMMUNITY): Payer: Self-pay | Admitting: Emergency Medicine

## 2018-10-05 ENCOUNTER — Other Ambulatory Visit: Payer: Self-pay

## 2018-10-05 ENCOUNTER — Ambulatory Visit (HOSPITAL_COMMUNITY)
Admission: EM | Admit: 2018-10-05 | Discharge: 2018-10-05 | Disposition: A | Discharge status: Home / Self Care | Payer: BC Managed Care – PPO | Attending: Family Medicine | Admitting: Family Medicine

## 2018-10-05 DIAGNOSIS — R1013 Epigastric pain: Secondary | ICD-10-CM | POA: Diagnosis not present

## 2018-10-05 MED ORDER — SUCRALFATE 1 G PO TABS: 1.0000 g | ORAL_TABLET | Freq: Three times a day (TID) | ORAL | 0 refills | Status: DC

## 2018-10-05 NOTE — ED Triage Notes (Signed)
Pt sts upper abd/epigastric pain x 1 week worse after eating and when laying down

## 2018-10-05 NOTE — Telephone Encounter (Signed)
Patient complains of pains in the upper abdomen and has a FU appointment with you on 10/12/2018

## 2018-10-05 NOTE — ED Provider Notes (Signed)
[facility]   [id number] 10/05/18 Arrival Time: 1015  ASSESSMENT & PLAN:  1. Epigastric pain   2. Dyspepsia    Benign abdominal exam. No indications for urgent abdominal/pelvic imaging at this time. Discussed.  Discussed GERD. Continue QD Prilosec. Trial of:  Meds ordered this encounter  Medications  . sucralfate (CARAFATE) 1 g tablet    Sig: Take 1 tablet (1 g total) by mouth 4 (four) times daily -  with meals and at bedtime.    Dispense:  28 tablet    Refill:  0   Follow-up Information    Marcine Matar, MD.   Specialty: Internal Medicine Why: As needed. Contact information: [address] [place] [address] [phone]        [facility] EMERGENCY DEPARTMENT.   Specialty: Emergency Medicine Why: If symptoms worsen in any way. Contact information: [address] [id number] mc [place] [address] [phone]          Reviewed expectations re: course of current medical issues. Questions answered. Outlined signs and symptoms indicating need for more acute intervention. Patient verbalized understanding. After Visit Summary given.   SUBJECTIVE: History from: patient. Jason Stevenson is a 33 y.o. male who presents with complaint of intermittent epigastric abdominal discomfort. Onset gradual, over the past week. Discomfort described as burning; without radiation. When present, symptoms are unchanged since beginning. Fever: absent. Aggravating factors: include eating certain foods. Alleviating factors: Prilosec; questions some help. Associated symptoms: belching. He denies arthralgias, diarrhea, dysuria, headache, melena, myalgias, nausea, sweats and vomiting. Appetite: normal. PO intake: normal. Ambulatory without assistance. Urinary symptoms: none. Bowel movements: have not significantly changed; last bowel movement within the pat 1-2 days; without blood. History of similar: no.  Past Surgical History:   Procedure Laterality Date  . None      ROS: As per HPI. All other systems negative.  OBJECTIVE:  Vitals:   10/05/18 1048  BP: (!) 139/97  Pulse: 88  Resp: 18  Temp: 98.4 F (36.9 C)  TempSrc: Oral  SpO2: 98%    General appearance: alert, oriented, no acute distress  HEENT: [place]; AT Lungs: clear to auscultation bilaterally; unlabored respirations Heart: regular rate and rhythm Abdomen: soft; without distention; no tenderness; normal bowel sounds; without masses or organomegaly; without guarding or rebound tenderness Back: without CVA tenderness; FROM at waist Extremities: without LE edema; symmetrical; without gross deformities Skin: warm and dry Neurologic: normal gait Psychological: alert and cooperative; normal mood and affect  No Known Allergies                                             Past Medical History:  Diagnosis Date  . Chronic systolic CHF (congestive heart failure) ([facility]) 06/18/2015  . Diabetes mellitus without complication ([facility])   . Hyperlipidemia   . Hypertension   . Nonischemic cardiomyopathy ([facility]) Noted as early as 2007   Per chart review (cards consult note 2011), EF of 40% in 2007, down to 20-25% in 2011   Social History   Socioeconomic History  . Marital status: Married    Spouse name: Not on file  . Number of children: Not on file  . Years of education: Not on file  . Highest education level: Not on file  Occupational History  . Occupation: Corporate treasurer  Social Needs  . Financial resource strain: Not on  file  . Food insecurity    Worry: Not on file    Inability: Not on file  . Transportation needs    Medical: Not on file    Non-medical: Not on file  Tobacco Use  . Smoking status: Never Smoker  . Smokeless tobacco: Never Used  Substance and Sexual Activity  . Alcohol use: Yes    Comment: "occasional" when "hanging out with the wrong people" No recent use.  . Drug use: No    Types: Marijuana    Comment: Last in 2013  . Sexual  activity: Not on file  Lifestyle  . Physical activity    Days per week: Not on file    Minutes per session: Not on file  . Stress: Not on file  Relationships  . Social Herbalist on phone: Not on file    Gets together: Not on file    Attends religious service: Not on file    Active member of club or organization: Not on file    Attends meetings of clubs or organizations: Not on file    Relationship status: Not on file  . Intimate partner violence    Fear of current or ex partner: Not on file    Emotionally abused: Not on file    Physically abused: Not on file    Forced sexual activity: Not on file  Other Topics Concern  . Not on file  Social History Narrative   Pt lives alone. Has no information on father.   Family History  Problem Relation Age of Onset  . Diabetes Mother   . Hypertension Mother   . Stroke Maternal Aunt   . Heart attack Neg Hx      Vanessa Kick, MD 10/07/18 0930

## 2018-10-12 ENCOUNTER — Ambulatory Visit: Payer: BC Managed Care – PPO | Attending: Internal Medicine | Admitting: Internal Medicine

## 2018-10-12 ENCOUNTER — Other Ambulatory Visit: Payer: Self-pay

## 2018-10-12 ENCOUNTER — Encounter: Payer: Self-pay | Admitting: Internal Medicine

## 2018-10-12 ENCOUNTER — Ambulatory Visit: Payer: BC Managed Care – PPO | Admitting: Pharmacist

## 2018-10-12 VITALS — BP 117/71 | HR 89 | Temp 98.9°F | Resp 18 | Ht 71.0 in | Wt 176.0 lb

## 2018-10-12 DIAGNOSIS — E1169 Type 2 diabetes mellitus with other specified complication: Secondary | ICD-10-CM

## 2018-10-12 DIAGNOSIS — Z9114 Patient's other noncompliance with medication regimen: Secondary | ICD-10-CM | POA: Insufficient documentation

## 2018-10-12 DIAGNOSIS — E785 Hyperlipidemia, unspecified: Secondary | ICD-10-CM

## 2018-10-12 DIAGNOSIS — I10 Essential (primary) hypertension: Secondary | ICD-10-CM

## 2018-10-12 DIAGNOSIS — I5022 Chronic systolic (congestive) heart failure: Secondary | ICD-10-CM

## 2018-10-12 DIAGNOSIS — Z79899 Other long term (current) drug therapy: Secondary | ICD-10-CM

## 2018-10-12 DIAGNOSIS — L309 Dermatitis, unspecified: Secondary | ICD-10-CM

## 2018-10-12 DIAGNOSIS — E1165 Type 2 diabetes mellitus with hyperglycemia: Secondary | ICD-10-CM

## 2018-10-12 DIAGNOSIS — Z794 Long term (current) use of insulin: Secondary | ICD-10-CM

## 2018-10-12 DIAGNOSIS — Z91148 Patient's other noncompliance with medication regimen for other reason: Secondary | ICD-10-CM

## 2018-10-12 DIAGNOSIS — IMO0001 Reserved for inherently not codable concepts without codable children: Secondary | ICD-10-CM

## 2018-10-12 LAB — POCT GLYCOSYLATED HEMOGLOBIN (HGB A1C): Hemoglobin A1C: 13.7 % — AB (ref 4.0–5.6)

## 2018-10-12 LAB — GLUCOSE, POCT (MANUAL RESULT ENTRY): POC Glucose: 260 mg/dl — AB (ref 70–99)

## 2018-10-12 MED ORDER — BLOOD PRESSURE MONITOR DEVI: 0 refills | Status: AC

## 2018-10-12 MED ORDER — TRULICITY 0.75 MG/0.5ML ~~LOC~~ SOAJ: 0.7500 mg | SUBCUTANEOUS | 5 refills | Status: DC

## 2018-10-12 NOTE — Patient Instructions (Signed)
Please give patient an appointment with the clinical pharmacist in 1 month for recheck on diabetes.  Stop Humalog insulin. Start Trulicity injections once a week.   Diabetes Mellitus and Standards of Medical Care Managing diabetes (diabetes mellitus) can be complicated. Your diabetes treatment may be managed by a team of health care providers, including:  A physician who specializes in diabetes (endocrinologist).  A nurse practitioner or physician assistant.  Nurses.  A diet and nutrition specialist (registered dietitian).  A certified diabetes educator (CDE).  An exercise specialist.  A pharmacist.  An eye doctor.  A foot specialist (podiatrist).  A dentist.  A primary care provider.  A mental health provider. Your health care providers follow guidelines to help you get the best quality of care. The following schedule is a general guideline for your diabetes management plan. Your health care providers may give you more specific instructions. Physical exams Upon being diagnosed with diabetes mellitus, and each year after that, your health care provider will ask about your medical and family history. He or she will also do a physical exam. Your exam may include:  Measuring your height, weight, and body mass index (BMI).  Checking your blood pressure. This will be done at every routine medical visit. Your target blood pressure may vary depending on your medical conditions, your age, and other factors.  Thyroid gland exam.  Skin exam.  Screening for damage to your nerves (peripheral neuropathy). This may include checking the pulse in your legs and feet and checking the level of sensation in your hands and feet.  A complete foot exam to inspect the structure and skin of your feet, including checking for cuts, bruises, redness, blisters, sores, or other problems.  Screening for blood vessel (vascular) problems, which may include checking the pulse in your legs and feet and  checking your temperature. Blood tests Depending on your treatment plan and your personal needs, you may have the following tests done:  HbA1c (hemoglobin A1c). This test provides information about blood sugar (glucose) control over the previous 2-3 months. It is used to adjust your treatment plan, if needed. This test will be done: ? At least 2 times a year, if you are meeting your treatment goals. ? 4 times a year, if you are not meeting your treatment goals or if treatment goals have changed.  Lipid testing, including total, LDL, and HDL cholesterol and triglyceride levels. ? The goal for LDL is less than 100 mg/dL (5.5 mmol/L). If you are at high risk for complications, the goal is less than 70 mg/dL (3.9 mmol/L). ? The goal for HDL is 40 mg/dL (2.2 mmol/L) or higher for men and 50 mg/dL (2.8 mmol/L) or higher for women. An HDL cholesterol of 60 mg/dL (3.3 mmol/L) or higher gives some protection against heart disease. ? The goal for triglycerides is less than 150 mg/dL (8.3 mmol/L).  Liver function tests.  Kidney function tests.  Thyroid function tests. Dental and eye exams  Visit your dentist two times a year.  If you have type 1 diabetes, your health care provider may recommend an eye exam 3-5 years after you are diagnosed, and then once a year after your first exam. ? For children with type 1 diabetes, a health care provider may recommend an eye exam when your child is age 58 or older and has had diabetes for 3-5 years. After the first exam, your child should get an eye exam once a year.  If you have type 2 diabetes,  your health care provider may recommend an eye exam as soon as you are diagnosed, and then once a year after your first exam. Immunizations   The yearly flu (influenza) vaccine is recommended for everyone 6 months or older who has diabetes.  The pneumonia (pneumococcal) vaccine is recommended for everyone 2 years or older who has diabetes. If you are 46 or older,  you may get the pneumonia vaccine as a series of two separate shots.  The hepatitis B vaccine is recommended for adults shortly after being diagnosed with diabetes.  Adults and children with diabetes should receive all other vaccines according to age-specific recommendations from the Centers for Disease Control and Prevention (CDC). Mental and emotional health Screening for symptoms of eating disorders, anxiety, and depression is recommended at the time of diagnosis and afterward as needed. If your screening shows that you have symptoms (positive screening result), you may need more evaluation and you may work with a mental health care provider. Treatment plan Your treatment plan will be reviewed at every medical visit. You and your health care provider will discuss:  How you are taking your medicines, including insulin.  Any side effects you are experiencing.  Your blood glucose target goals.  The frequency of your blood glucose monitoring.  Lifestyle habits, such as activity level as well as tobacco, alcohol, and substance use. Diabetes self-management education Your health care provider will assess how well you are monitoring your blood glucose levels and whether you are taking your insulin correctly. He or she may refer you to:  A certified diabetes educator to manage your diabetes throughout your life, starting at diagnosis.  A registered dietitian who can create or review your personal nutrition plan.  An exercise specialist who can discuss your activity level and exercise plan. Summary  Managing diabetes (diabetes mellitus) can be complicated. Your diabetes treatment may be managed by a team of health care providers.  Your health care providers follow guidelines in order to help you get the best quality of care.  Standards of care including having regular physical exams, blood tests, blood pressure monitoring, immunizations, screening tests, and education about how to manage  your diabetes.  Your health care providers may also give you more specific instructions based on your individual health. This information is not intended to replace advice given to you by your health care provider. Make sure you discuss any questions you have with your health care provider. Document Released: 01/27/2009 Document Revised: 12/19/2017 Document Reviewed: 12/29/2015 Elsevier Patient Education  2020 [employer].

## 2018-10-12 NOTE — Progress Notes (Signed)
Patient was educated on the use of the Trulicity pen. Reviewed necessary supplies and operation of the pen. Also reviewed goal blood glucose levels. Patient was able to demonstrate use. All questions and concerns were addressed.  Patient seen by:  Jonathon Bellows PharmD Candidate [phone] [facility]   Supervision provided by:  Benard Halsted, PharmD, [place] [phone]

## 2018-10-12 NOTE — Progress Notes (Signed)
Patient ID: Jason Stevenson, male    DOB: Nov 09, 1985  MRN: [id number]  CC: Follow-up   Subjective: Jason Stevenson is a 33 y.o. male who presents for chronic ds management. His concerns today include:  Patient with history of DM, HTN, HL, NICM with systolic CHF EF 13-08% (echo 08/2015, Cardiologist Dr. Johnsie Cancel), chronic LBP  DIABETES TYPE 2 Last A1C:   Results for orders placed or performed in visit on 10/12/18  HgB A1c  Result Value Ref Range   Hemoglobin A1C 13.7 (A) 4.0 - 5.6 %   HbA1c POC (<> result, manual entry)     HbA1c, POC (prediabetic range)     HbA1c, POC (controlled diabetic range)    Glucose (CBG)  Result Value Ref Range   POC Glucose 260 (A) 70 - 99 mg/dl    Med Adherence:  _0  Yes    _1  No, admits that he skip doses.  Not taking Levemir consistently "because I don't like sticking myself."  Takes it about 3-4 x a wk.  Not taking Humalog consistently.  Takes Metformin and Glipizide "sometimes once a day , sometimes twice a day"  Medication side effects:  _2  Yes    _3  No Home Monitoring?  _4  Yes once a day    _5  No Home glucose results range: 200s Diet Adherence: Not eating out as much since COVID.  Snakes on [employer].  No sugary drinks Exercise: _6  Yes    _7  No - working from home 6 days a wk Hypoglycemic episodes?: _8  Yes    _9  No Numbness of the feet? _10  Yes    _11  No Retinopathy hx? _12  Yes    _13  No Last eye exam: over due for eye Comments:   HYPERTENSION/CHF/NICM Currently taking: see medication list Med Adherence: _14  Yes but not consistently    _15  No Medication side effects: _16  Yes    _17  No Adherence with salt restriction: _18  Yes    _19  No Home Monitoring?: _20  Yes    _21  No Monitoring Frequency: _22  Yes    _23  No Home BP results range: _24  Yes    _25  No SOB? _26  Yes    _27  No Chest Pain?: _28  Yes    _29  No Leg swelling?: _30  Yes-sometimes when he sits for long periods at home especially since he has been working from home Headaches?: _31  Yes     _32  No Dizziness? _33  Yes    _34  No Comments:   HL:  Taking Crestor inconsistently  Patient Active Problem List   Diagnosis Date Noted  . Pain in left testicle 11/06/2016  . Dyslipidemia 04/17/2016  . Uncontrolled type 2 diabetes mellitus without complication, with long-term current use of insulin ([place]) 08/07/2015  . Chronic systolic CHF (congestive heart failure) ([place]) 06/18/2015  . Needs flu shot 12/23/2013  . Hyperglycemia 11/18/2013  . Type 2 diabetes mellitus ([place]) 11/18/2013  . Other primary cardiomyopathies 11/18/2013  . Diabetes ([place]) 06/17/2013  . Type II or unspecified type diabetes mellitus  11/20/2012  . Non-ischemic cardiomyopathy ([place]) 11/06/2012  . HTN (hypertension) 11/06/2012  . HLD (hyperlipidemia) 11/06/2012     Current Outpatient Medications on File Prior to Visit  Medication Sig Dispense Refill  . Blood Glucose Monitoring Suppl (TRUE METRIX METER) w/Device KIT 1 each by Does not apply route 3 (three) times daily. 1 kit 0  . carvedilol (COREG) 6.25 MG tablet Take 1.5 tablets (9.375 mg total) by mouth 2 (two) times daily with a meal. 90 tablet 0  .  glipiZIDE (GLUCOTROL) 10 MG tablet Take 1 tablet (10 mg total) by mouth 2 (two) times daily before a meal. To lower blood sugar 60 tablet 4  . glucose blood test strip Use as instructed 100 each 12  . insulin detemir (LEVEMIR) 100 UNIT/ML injection Inject 0.3 mLs (30 Units total) into the skin at bedtime. 30 mL 0  . Insulin Syringe-Needle U-100 (RELION INSULIN SYRINGE) 31G X 15/64" 0.3 ML MISC Use to inject insulin daily. 100 each 11  . metFORMIN (GLUCOPHAGE) 1000 MG tablet TAKE 1 TABLET BY MOUTH 2 TIMES DAILY WITH A MEAL 180 tablet 0  . nitroGLYCERIN (NITROSTAT) 0.4 MG SL tablet Place 1 tablet (0.4 mg total) under the tongue every 5 (five) minutes as needed for chest pain. 25 tablet 2  . Omega-3 Fatty Acids (FISH OIL) 1000 MG CPDR Take 2,000 mg by mouth daily. 180 capsule 0  . rosuvastatin (CRESTOR) 20 MG tablet Take 1  tablet (20 mg total) by mouth daily. To lower cholesterol 90 tablet 0  . sacubitril-valsartan (ENTRESTO) 49-51 MG Take 1 tablet by mouth 2 (two) times daily. 180 tablet 0  . sucralfate (CARAFATE) 1 g tablet Take 1 tablet (1 g total) by mouth 4 (four) times daily -  with meals and at bedtime. 28 tablet 0   No current facility-administered medications on file prior to visit.     No Known Allergies  Social History   Socioeconomic History  . Marital status: Married    Spouse name: Not on file  . Number of children: Not on file  . Years of education: Not on file  . Highest education level: Not on file  Occupational History  . Occupation: Chiropodist  Social Needs  . Financial resource strain: Not on file  . Food insecurity    Worry: Not on file    Inability: Not on file  . Transportation needs    Medical: Not on file    Non-medical: Not on file  Tobacco Use  . Smoking status: Never Smoker  . Smokeless tobacco: Never Used  Substance and Sexual Activity  . Alcohol use: Yes    Comment: "occasional" when "hanging out with the wrong people" No recent use.  . Drug use: No    Types: Marijuana    Comment: Last in 2013  . Sexual activity: Not on file  Lifestyle  . Physical activity    Days per week: Not on file    Minutes per session: Not on file  . Stress: Not on file  Relationships  . Social Herbalist on phone: Not on file    Gets together: Not on file    Attends religious service: Not on file    Active member of club or organization: Not on file    Attends meetings of clubs or organizations: Not on file    Relationship status: Not on file  . Intimate partner violence    Fear of current or ex partner: Not on file    Emotionally abused: Not on file    Physically abused: Not on file    Forced sexual activity: Not on file  Other Topics Concern  . Not on file  Social History Narrative   Pt lives alone. Has no information on father.    Family History   Problem Relation Age of Onset  . Diabetes Mother   . Hypertension Mother   . Stroke Maternal Aunt   . Heart attack Neg Hx  Past Surgical History:  Procedure Laterality Date  . None      ROS: Review of Systems  Skin:       Small patch of dry rash below the left breast and right lower back.  No initiating factors.  Not too itchy.    PHYSICAL EXAM: BP 117/71 (BP Location: Left Arm, Patient Position: Sitting, Cuff Size: Normal)   Pulse 89   Temp 98.9 F (37.2 C) (Oral)   Resp 18   Ht _0  (1.803 m)   Wt 176 lb (79.8 kg)   SpO2 97%   BMI 24.55 kg/m   Physical Exam  General appearance - alert, well appearing, and in no distress Mental status - normal mood, behavior, speech, dress, motor activity, and thought processes Neck - supple, no significant adenopathy Chest - clear to auscultation, no wheezes, rales or rhonchi, symmetric air entry Heart - normal rate, regular rhythm, normal S1, S2, no murmurs, rubs, clicks or gallops Extremities - peripheral pulses normal, no pedal edema, no clubbing or cyanosis Skin -patch of dry excoriated skin on the left lower anterior rib cage about 2 cm in size.  Similar one on the right lower back about 2 cm in size.  Diabetic Foot Exam - Simple   Simple Foot Form Visual Inspection See comments: Yes Sensation Testing Intact to touch and monofilament testing bilaterally: Yes Pulse Check Posterior Tibialis and Dorsalis pulse intact bilaterally: Yes Comments Flat footed     A1C 13.7/BS 260  CMP Latest Ref Rng & Units 07/02/2018 03/04/2017 04/17/2016  Glucose 65 - 99 mg/dL 360(H) 81 331(H)  BUN 6 - 20 mg/dL _1 Creatinine 0.76 - 1.27 mg/dL 0.89 0.84 0.80  Sodium 134 - 144 mmol/L 138 139 138  Potassium 3.5 - 5.2 mmol/L 4.6 3.4(L) 4.2  Chloride 96 - 106 mmol/L 99 105 102  CO2 20 - 29 mmol/L _2 Calcium 8.7 - 10.2 mg/dL 9.6 9.2 8.8  Total Protein 6.0 - 8.5 g/dL 7.1 7.1 7.0  Total Bilirubin 0.0 - 1.2 mg/dL 0.3 0.8 0.5   Alkaline Phos 39 - 117 IU/L 66 63 59  AST 0 - 40 IU/L _3 ALT 0 - 44 IU/L _4 Lipid Panel     Component Value Date/Time   CHOL 272 (H) 07/02/2018 1115   TRIG 218 (H) 07/02/2018 1115   HDL 37 (L) 07/02/2018 1115   CHOLHDL 7.4 (H) 07/02/2018 1115   CHOLHDL 7.8 (H) 04/17/2016 1204   VLDL 21 04/17/2016 1204   LDLCALC 191 (H) 07/02/2018 1115    CBC    Component Value Date/Time   WBC 8.1 03/04/2017 2256   RBC 4.90 03/04/2017 2256   HGB 14.2 03/04/2017 2256   HCT 41.7 03/04/2017 2256   PLT 262 03/04/2017 2256   MCV 85.1 03/04/2017 2256   MCH 29.0 03/04/2017 2256   MCHC 34.1 03/04/2017 2256   RDW 12.0 03/04/2017 2256   LYMPHSABS 2.6 11/14/2009 2120   MONOABS 0.4 11/14/2009 2120   EOSABS 0.1 11/14/2009 2120   BASOSABS 0.0 11/14/2009 2120    ASSESSMENT AND PLAN: 1. Uncontrolled type 2 diabetes mellitus without complication, with long-term current use of insulin ([place]) 2. Noncompliance with medication treatment due to intermittent use of medication Discussed the importance of healthy eating habits, regular aerobic exercise (at least 150 minutes a week as tolerated) and medication compliance to achieve or maintain control of diabetes. -He is not an ideal candidate to  be on full daily shots due to noncompliance.  I recommend stopping the Humalog and placing him on Trulicity instead.  He will continue the Levemir, metformin and glipizide.  Clinical pharmacist met with the patient today to teach use of Trulicity - HgB P1W - Glucose (CBG) - Ambulatory referral to Ophthalmology - Dulaglutide (TRULICITY) 2.58 NI/7.7OE SOPN; Inject 0.75 mg into the skin once a week.  Dispense: 4 pen; Refill: 5  3. Chronic systolic congestive heart failure (HCC) Clinically stable and compensated.  Continue current medications including Entresto.  Followed by cardiology  4. Dyslipidemia associated with type 2 diabetes mellitus (HCC) Continue Crestor.  5. Dermatitis Recommend  over-the-counter hydrocortisone cream  6. Essential hypertension At goal of 130/80 or lower.  Encourage low-salt diet and compliance with medications. - Blood Pressure Monitor DEVI; Use as directed to check home blood pressure 2-3 times a week  Dispense: 1 Device; Refill: 0   Patient was given the opportunity to ask questions.  Patient verbalized understanding of the plan and was able to repeat key elements of the plan.   Orders Placed This Encounter  Procedures  . Ambulatory referral to Ophthalmology  . HgB A1c  . Glucose (CBG)     Requested Prescriptions   Signed Prescriptions Disp Refills  . Dulaglutide (TRULICITY) 4.23 NT/6.1WE SOPN 4 pen 5    Sig: Inject 0.75 mg into the skin once a week.  . Blood Pressure Monitor DEVI 1 Device 0    Sig: Use as directed to check home blood pressure 2-3 times a week    Return in about 3 months (around 01/12/2019).  Karle Plumber, MD, FACP

## 2018-10-19 ENCOUNTER — Other Ambulatory Visit: Payer: Self-pay

## 2018-10-19 ENCOUNTER — Other Ambulatory Visit: Payer: BC Managed Care – PPO | Admitting: *Deleted

## 2018-10-19 ENCOUNTER — Ambulatory Visit (HOSPITAL_COMMUNITY): Payer: BC Managed Care – PPO | Attending: Physician Assistant

## 2018-10-19 DIAGNOSIS — I428 Other cardiomyopathies: Secondary | ICD-10-CM | POA: Diagnosis not present

## 2018-10-19 DIAGNOSIS — Z794 Long term (current) use of insulin: Secondary | ICD-10-CM

## 2018-10-19 DIAGNOSIS — E119 Type 2 diabetes mellitus without complications: Secondary | ICD-10-CM

## 2018-10-19 DIAGNOSIS — I5022 Chronic systolic (congestive) heart failure: Secondary | ICD-10-CM | POA: Insufficient documentation

## 2018-10-19 DIAGNOSIS — E785 Hyperlipidemia, unspecified: Secondary | ICD-10-CM

## 2018-10-19 DIAGNOSIS — I10 Essential (primary) hypertension: Secondary | ICD-10-CM

## 2018-10-19 LAB — LIPID PANEL
Chol/HDL Ratio: 6.4 ratio — ABNORMAL HIGH (ref 0.0–5.0)
Cholesterol, Total: 235 mg/dL — ABNORMAL HIGH (ref 100–199)
HDL: 37 mg/dL — ABNORMAL LOW (ref >39)
LDL Calculated: 154 mg/dL — ABNORMAL HIGH (ref 0–99)
Triglycerides: 221 mg/dL — ABNORMAL HIGH (ref 0–149)
VLDL Cholesterol Cal: 44 mg/dL — ABNORMAL HIGH (ref 5–40)

## 2018-10-19 LAB — HEPATIC FUNCTION PANEL
ALT: 13 IU/L (ref 0–44)
AST: 17 IU/L (ref 0–40)
Albumin: 4.6 g/dL (ref 4.0–5.0)
Alkaline Phosphatase: 60 IU/L (ref 39–117)
Bilirubin Total: 0.4 mg/dL (ref 0.0–1.2)
Bilirubin, Direct: 0.11 mg/dL (ref 0.00–0.40)
Total Protein: 7.3 g/dL (ref 6.0–8.5)

## 2018-10-27 ENCOUNTER — Telehealth: Payer: Self-pay

## 2018-10-27 DIAGNOSIS — E785 Hyperlipidemia, unspecified: Secondary | ICD-10-CM

## 2018-10-27 MED ORDER — ROSUVASTATIN CALCIUM 40 MG PO TABS: 40.0000 mg | ORAL_TABLET | Freq: Every day | ORAL | 3 refills | Status: DC

## 2018-10-27 NOTE — Telephone Encounter (Signed)
-----   Message from Imogene Burn, PA-C sent at 10/20/2018  9:16 AM EDT ----- Cholesterol has come down some but levels still high.he's already on fish oil. Increase crestor 40 mg daily. Recheck lipids in 3 months. Stress compliance. thanks

## 2018-10-27 NOTE — Telephone Encounter (Signed)
Called and made patient aware of lab and echo results. Instructed for patient to increase crestor to 40 mg QD and repeat fasting LIPIDS in 3 months. Appt made for 10/12. Made patient aware that he will be contacted to schedule an appt with Dr. Aundra Dubin.

## 2018-10-27 NOTE — Telephone Encounter (Signed)
-----   Message from Imogene Burn, PA-C sent at 10/20/2018  9:25 AM EDT ----- Heart function still very depressed at 15-20% similar to last echo. Compliance is very important. Please arrange f/u with Dr. Aundra Dubin in CHF clinic-he's seen him in the past. Not urgent, just next available.

## 2018-11-16 ENCOUNTER — Telehealth (HOSPITAL_COMMUNITY): Payer: Self-pay | Admitting: Cardiology

## 2018-11-16 NOTE — Telephone Encounter (Signed)
Called and left message for pt to call back.  Per staff message from Kevan Rosebush, RN schedule pt with next avail f/u with Dr. Aundra Dubin.

## 2018-11-24 NOTE — Progress Notes (Signed)
CARDIOLOGY CONSULT NOTE       Patient ID: Jason Stevenson MRN: [id number] DOB/AGE: 08-24-1985 33 y.o.  Admit date: (Not on file) Primary Physician: Ladell Pier, MD Primary Cardiologist: Johnsie Cancel Reason for Consultation: DCM  Active Problems:   * No active hospital problems. *   HPI:  33 y.o. I have not seen since 2017 Last seen by PA 08/25/18.  No CAD by CT in 2014.  Last echo 10/19/18 EF 15-20% with mild RV enlargement no significant valve disease Does not have insurance and notes indicate AICD Rx not discussed because of this Also has DM and HLD and HTN  A1c has been as high as 12 and he does not like taking insulin   No angina or volume overload still no insurance not checking BS   ROS All other systems reviewed and negative except as noted above  Past Medical History:  Diagnosis Date  . Chronic systolic CHF (congestive heart failure) ([place]) 06/18/2015  . Diabetes mellitus without complication ([place])   . Hyperlipidemia   . Hypertension   . Nonischemic cardiomyopathy ([place]) Noted as early as 2007   Per chart review (cards consult note 2011), EF of 40% in 2007, down to 20-25% in 2011    Family History  Problem Relation Age of Onset  . Diabetes Mother   . Hypertension Mother   . Stroke Maternal Aunt   . Heart attack Neg Hx     Social History   Socioeconomic History  . Marital status: Married    Spouse name: Not on file  . Number of children: Not on file  . Years of education: Not on file  . Highest education level: Not on file  Occupational History  . Occupation: Chiropodist  Social Needs  . Financial resource strain: Not on file  . Food insecurity    Worry: Not on file    Inability: Not on file  . Transportation needs    Medical: Not on file    Non-medical: Not on file  Tobacco Use  . Smoking status: Never Smoker  . Smokeless tobacco: Never Used  Substance and Sexual Activity  . Alcohol use: Yes    Comment: "occasional" when "hanging out with the  wrong people" No recent use.  . Drug use: No    Types: Marijuana    Comment: Last in 2013  . Sexual activity: Not on file  Lifestyle  . Physical activity    Days per week: Not on file    Minutes per session: Not on file  . Stress: Not on file  Relationships  . Social Herbalist on phone: Not on file    Gets together: Not on file    Attends religious service: Not on file    Active member of club or organization: Not on file    Attends meetings of clubs or organizations: Not on file    Relationship status: Not on file  . Intimate partner violence    Fear of current or ex partner: Not on file    Emotionally abused: Not on file    Physically abused: Not on file    Forced sexual activity: Not on file  Other Topics Concern  . Not on file  Social History Narrative   Pt lives alone. Has no information on father.    Past Surgical History:  Procedure Laterality Date  . None          Physical Exam: There were no vitals taken for  this visit.   Affect appropriate Healthy:  appears stated age HEENT: normal Neck supple with no adenopathy JVP normal no bruits no thyromegaly Lungs clear with no wheezing and good diaphragmatic motion Heart:  S1/S2 no murmur, no rub, gallop or click PMI enlarged  Abdomen: benighn, BS positve, no tenderness, no AAA no bruit.  No HSM or HJR Distal pulses intact with no bruits No edema Neuro non-focal Skin warm and dry No muscular weakness  Labs:   Lab Results  Component Value Date   WBC 8.1 03/04/2017   HGB 14.2 03/04/2017   HCT 41.7 03/04/2017   MCV 85.1 03/04/2017   PLT 262 03/04/2017   No results for input(s): NA, K, CL, CO2, BUN, CREATININE, CALCIUM, PROT, BILITOT, ALKPHOS, ALT, AST, GLUCOSE in the last 168 hours.  Invalid input(s): LABALBU Lab Results  Component Value Date   CKTOTAL 370 (H) 11/15/2009   CKMB 2.2 11/15/2009   TROPONINI <0.30 11/06/2012    Lab Results  Component Value Date   CHOL 235 (H) 10/19/2018    CHOL 272 (H) 07/02/2018   CHOL 212 (H) 11/06/2016   Lab Results  Component Value Date   HDL 37 (L) 10/19/2018   HDL 37 (L) 07/02/2018   HDL 43 11/06/2016   Lab Results  Component Value Date   LDLCALC 154 (H) 10/19/2018   LDLCALC 191 (H) 07/02/2018   LDLCALC 141 (H) 11/06/2016   Lab Results  Component Value Date   TRIG 221 (H) 10/19/2018   TRIG 218 (H) 07/02/2018   TRIG 141 11/06/2016   Lab Results  Component Value Date   CHOLHDL 6.4 (H) 10/19/2018   CHOLHDL 7.4 (H) 07/02/2018   CHOLHDL 4.9 11/06/2016   No results found for: LDLDIRECT    Radiology: No results found.  EKG: SR rate 93  insignificant Q waves in 2,3,F PVC    ASSESSMENT AND PLAN:   1. CHF:  Chronic systolic non ischemic Continue Coreg. Continue entresto  2. DM:  Discussed low carb diet.  Target hemoglobin A1c is 6.5 or less.  Continue current medications. 3. HLD on crestor labs with primary  4. HTN: Well controlled.  Continue current medications and low sodium Dash type diet.    F/u in 6 months   Signed: Charlton Haws 11/24/2018, 8:26 AM

## 2018-12-04 ENCOUNTER — Other Ambulatory Visit: Payer: Self-pay

## 2018-12-04 ENCOUNTER — Ambulatory Visit (INDEPENDENT_AMBULATORY_CARE_PROVIDER_SITE_OTHER): Payer: BC Managed Care – PPO | Admitting: Cardiovascular Disease

## 2018-12-04 VITALS — Wt 167.0 lb

## 2018-12-04 DIAGNOSIS — R9431 Abnormal electrocardiogram [ECG] [EKG]: Secondary | ICD-10-CM

## 2018-12-04 DIAGNOSIS — I428 Other cardiomyopathies: Secondary | ICD-10-CM

## 2018-12-04 NOTE — Patient Instructions (Signed)
Your physician recommends that you continue on your current medications as directed. Please refer to the Current Medication list given to you today.   Your physician wants you to follow-up in:  6 MONTHS WITH DR NISHAN  You will receive a reminder letter in the mail two months in advance. If you don't receive a letter, please call our office to schedule the follow-up appointment. 

## 2018-12-15 NOTE — Telephone Encounter (Signed)
Called and left message for patient to call the office.  Need to schedule next available with Dr. Aundra Dubin.

## 2019-01-07 ENCOUNTER — Encounter: Payer: Self-pay | Admitting: Internal Medicine

## 2019-01-07 ENCOUNTER — Telehealth: Payer: Self-pay | Admitting: Internal Medicine

## 2019-01-07 NOTE — Telephone Encounter (Signed)
Patient dropped off Covid-19 accomodation forms advised of 7-10 business day paperwork policy patient stated they would like to speak to someone else because it is urgent.   Please follow up.

## 2019-01-08 ENCOUNTER — Other Ambulatory Visit: Payer: Self-pay

## 2019-01-08 ENCOUNTER — Emergency Department (HOSPITAL_COMMUNITY)
Admission: EM | Admit: 2019-01-08 | Discharge: 2019-01-08 | Disposition: A | Discharge status: Home / Self Care | Payer: BC Managed Care – PPO | Attending: Emergency Medicine | Admitting: Emergency Medicine

## 2019-01-08 ENCOUNTER — Encounter (HOSPITAL_COMMUNITY): Payer: Self-pay

## 2019-01-08 ENCOUNTER — Emergency Department (HOSPITAL_COMMUNITY): Payer: BC Managed Care – PPO

## 2019-01-08 DIAGNOSIS — M79632 Pain in left forearm: Secondary | ICD-10-CM | POA: Diagnosis not present

## 2019-01-08 DIAGNOSIS — I5032 Chronic diastolic (congestive) heart failure: Secondary | ICD-10-CM | POA: Diagnosis not present

## 2019-01-08 DIAGNOSIS — Z79899 Other long term (current) drug therapy: Secondary | ICD-10-CM | POA: Insufficient documentation

## 2019-01-08 DIAGNOSIS — R0789 Other chest pain: Secondary | ICD-10-CM | POA: Insufficient documentation

## 2019-01-08 DIAGNOSIS — E119 Type 2 diabetes mellitus without complications: Secondary | ICD-10-CM | POA: Insufficient documentation

## 2019-01-08 DIAGNOSIS — I110 Hypertensive heart disease with heart failure: Secondary | ICD-10-CM | POA: Insufficient documentation

## 2019-01-08 DIAGNOSIS — M79602 Pain in left arm: Secondary | ICD-10-CM | POA: Diagnosis not present

## 2019-01-08 DIAGNOSIS — Z7984 Long term (current) use of oral hypoglycemic drugs: Secondary | ICD-10-CM | POA: Diagnosis not present

## 2019-01-08 DIAGNOSIS — S299XXA Unspecified injury of thorax, initial encounter: Secondary | ICD-10-CM | POA: Diagnosis not present

## 2019-01-08 DIAGNOSIS — R079 Chest pain, unspecified: Secondary | ICD-10-CM | POA: Diagnosis not present

## 2019-01-08 DIAGNOSIS — S59912A Unspecified injury of left forearm, initial encounter: Secondary | ICD-10-CM | POA: Diagnosis not present

## 2019-01-08 MED ORDER — IBUPROFEN 800 MG PO TABS: 800.0000 mg | ORAL_TABLET | Freq: Three times a day (TID) | ORAL | 0 refills | Status: DC

## 2019-01-08 MED ORDER — CYCLOBENZAPRINE HCL 10 MG PO TABS: 10.0000 mg | ORAL_TABLET | Freq: Two times a day (BID) | ORAL | 0 refills | Status: DC | PRN
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
## 2019-01-18 ENCOUNTER — Ambulatory Visit: Payer: BC Managed Care – PPO | Attending: Internal Medicine | Admitting: Internal Medicine

## 2019-01-18 ENCOUNTER — Other Ambulatory Visit: Payer: Self-pay

## 2019-01-18 ENCOUNTER — Encounter: Payer: Self-pay | Admitting: Internal Medicine

## 2019-01-18 VITALS — BP 120/90 | HR 91 | Temp 98.2°F | Resp 18 | Ht 70.0 in | Wt 163.0 lb

## 2019-01-18 DIAGNOSIS — I5022 Chronic systolic (congestive) heart failure: Secondary | ICD-10-CM

## 2019-01-18 DIAGNOSIS — E1165 Type 2 diabetes mellitus with hyperglycemia: Secondary | ICD-10-CM | POA: Diagnosis not present

## 2019-01-18 DIAGNOSIS — L308 Other specified dermatitis: Secondary | ICD-10-CM | POA: Diagnosis not present

## 2019-01-18 DIAGNOSIS — K029 Dental caries, unspecified: Secondary | ICD-10-CM | POA: Diagnosis not present

## 2019-01-18 DIAGNOSIS — E119 Type 2 diabetes mellitus without complications: Secondary | ICD-10-CM

## 2019-01-18 DIAGNOSIS — E1169 Type 2 diabetes mellitus with other specified complication: Secondary | ICD-10-CM

## 2019-01-18 DIAGNOSIS — Z23 Encounter for immunization: Secondary | ICD-10-CM

## 2019-01-18 DIAGNOSIS — E785 Hyperlipidemia, unspecified: Secondary | ICD-10-CM

## 2019-01-18 DIAGNOSIS — Z794 Long term (current) use of insulin: Secondary | ICD-10-CM

## 2019-01-18 DIAGNOSIS — I10 Essential (primary) hypertension: Secondary | ICD-10-CM

## 2019-01-18 DIAGNOSIS — Z9114 Patient's other noncompliance with medication regimen: Secondary | ICD-10-CM

## 2019-01-18 DIAGNOSIS — L309 Dermatitis, unspecified: Secondary | ICD-10-CM

## 2019-01-18 LAB — GLUCOSE, POCT (MANUAL RESULT ENTRY): POC Glucose: 374 mg/dl — AB (ref 70–99)

## 2019-01-18 LAB — POCT GLYCOSYLATED HEMOGLOBIN (HGB A1C): Hemoglobin A1C: 13.8 % — AB (ref 4.0–5.6)

## 2019-01-18 MED ORDER — INSULIN DETEMIR 100 UNIT/ML ~~LOC~~ SOLN: 30.0000 [IU] | Freq: Every day | SUBCUTANEOUS | 11 refills | Status: DC

## 2019-01-18 MED ORDER — GLIPIZIDE 10 MG PO TABS: 10.0000 mg | ORAL_TABLET | Freq: Two times a day (BID) | ORAL | 4 refills | Status: DC

## 2019-01-18 MED ORDER — METFORMIN HCL 1000 MG PO TABS: ORAL_TABLET | ORAL | 0 refills | Status: DC

## 2019-01-18 MED ORDER — TRULICITY 0.75 MG/0.5ML ~~LOC~~ SOAJ: 0.7500 mg | SUBCUTANEOUS | 4 refills | Status: DC

## 2019-01-18 MED FILL — TRULICITY 0.75 MG/0.5 ML PE: 0.75 | 28 days supply | Qty: 2 | Fill #0

## 2019-01-18 NOTE — Progress Notes (Signed)
Patient ID: Jason Stevenson, male    DOB: 20-Oct-1985  MRN: [id number]  CC: Patient presents for chronic disease management.  Subjective: Jason Stevenson is a 33 y.o. male who presents for chronic ds management His concerns today include:  Patient with history of DM, HTN, HL,NICMwith systolic CHF EF 22-02% (echo 10/2018, Cardiologist Dr. Johnsie Cancel), chronic LBP  Patient last seen by me 09/2018.  DM: On last visit with me patient expressed noncompliance with Levemir and Humalog as well as his oral medications.  Humalog was discontinued and we placed him on Trulicity instead. -has not taken meds consistently for past 1.5 mths except the Trulicity.  -currently uninsured Feels he is doing better with eating habits.  Walks at park once a wk Blurred vision sometimes No numbness in feet Denies any depression isues  HYPERTENSION/NICM with systolic CHF: Saw Dr. Johnsie Cancel 11/2018.  Had insurance but defib placement not discussed per pt.  Pt states he was not pleased with the visit but plans to stick with him for now. Med Adherence: _0  Yes    _1  No - not taken meds for past 1.5 mths.  "I get tired of taking pills." Medication side effects: _2  Yes    _3  No Adherence with salt restriction: _4  Yes    _5  No Home Monitoring?: _6  Yes    _7  No Monitoring Frequency: _8  Yes    _9  No Home BP results range: _10  Yes    _11  No SOB? _12  Yes - after acivity    _13  No Chest Pain?: _14  Yes    _15  No Leg swelling?: _16  Yes    _17  No Headaches?: _18  Yes    _19  No Dizziness? _20  Yes    _21  No Comments:   Still has rash over left lower anterior rib cage and lower back from last visit.  Itches occasionally.  He tried over-the-counter hydrocortisone cream and this helped only a little. Patient Active Problem List   Diagnosis Date Noted  . Noncompliance with medication treatment due to intermittent use of medication 10/12/2018  . Dyslipidemia 04/17/2016  . Uncontrolled type 2 diabetes mellitus without complication,  with long-term current use of insulin 08/07/2015  . Chronic systolic CHF (congestive heart failure) ([place]) 06/18/2015  . Needs flu shot 12/23/2013  . Type 2 diabetes mellitus ([place]) 11/18/2013  . Diabetes ([place]) 06/17/2013  . Non-ischemic cardiomyopathy ([place]) 11/06/2012  . HTN (hypertension) 11/06/2012  . HLD (hyperlipidemia) 11/06/2012     Current Outpatient Medications on File Prior to Visit  Medication Sig Dispense Refill  . Blood Glucose Monitoring Suppl (TRUE METRIX METER) w/Device KIT 1 each by Does not apply route 3 (three) times daily. 1 kit 0  . Blood Pressure Monitor DEVI Use as directed to check home blood pressure 2-3 times a week 1 Device 0  . cyclobenzaprine (FLEXERIL) 10 MG tablet Take 1 tablet (10 mg total) by mouth 2 (two) times daily as needed for muscle spasms. 20 tablet 0  . Dulaglutide (TRULICITY) 5.42 HC/6.2BJ SOPN Inject 0.75 mg into the skin once a week. 4 pen 5  . glucose blood test strip Use as instructed 100 each 12  . carvedilol (COREG) 6.25 MG tablet Take 1.5 tablets (9.375 mg total) by mouth 2 (two) times daily with a meal. (Patient not taking: Reported on 01/18/2019) 90 tablet 0  . glipiZIDE (GLUCOTROL) 10 MG tablet Take 1 tablet (10 mg total) by mouth 2 (two) times daily before a meal. To lower blood sugar (Patient not taking: Reported on  01/18/2019) 60 tablet 4  . ibuprofen (ADVIL) 800 MG tablet Take 1 tablet (800 mg total) by mouth 3 (three) times daily. (Patient not taking: Reported on 01/18/2019) 21 tablet 0  . insulin detemir (LEVEMIR) 100 UNIT/ML injection Inject 0.3 mLs (30 Units total) into the skin at bedtime. (Patient not taking: Reported on 01/18/2019) 30 mL 0  . Insulin Syringe-Needle U-100 (RELION INSULIN SYRINGE) 31G X 15/64" 0.3 ML MISC Use to inject insulin daily. (Patient not taking: Reported on 01/18/2019) 100 each 11  . metFORMIN (GLUCOPHAGE) 1000 MG tablet TAKE 1 TABLET BY MOUTH 2 TIMES DAILY WITH A MEAL (Patient not taking: Reported on 01/18/2019) 180  tablet 0  . nitroGLYCERIN (NITROSTAT) 0.4 MG SL tablet Place 1 tablet (0.4 mg total) under the tongue every 5 (five) minutes as needed for chest pain. (Patient not taking: Reported on 01/18/2019) 25 tablet 2  . Omega-3 Fatty Acids (FISH OIL) 1000 MG CPDR Take 2,000 mg by mouth daily. (Patient not taking: Reported on 01/18/2019) 180 capsule 0  . rosuvastatin (CRESTOR) 40 MG tablet Take 1 tablet (40 mg total) by mouth daily. (Patient not taking: Reported on 01/18/2019) 90 tablet 3  . sacubitril-valsartan (ENTRESTO) 49-51 MG Take 1 tablet by mouth 2 (two) times daily. (Patient not taking: Reported on 01/18/2019) 180 tablet 0  . sucralfate (CARAFATE) 1 g tablet Take 1 tablet (1 g total) by mouth 4 (four) times daily -  with meals and at bedtime. (Patient not taking: Reported on 01/18/2019) 28 tablet 0   No current facility-administered medications on file prior to visit.     No Known Allergies  Social History   Socioeconomic History  . Marital status: Married    Spouse name: Not on file  . Number of children: Not on file  . Years of education: Not on file  . Highest education level: Not on file  Occupational History  . Occupation: Chiropodist  Social Needs  . Financial resource strain: Not on file  . Food insecurity    Worry: Not on file    Inability: Not on file  . Transportation needs    Medical: Not on file    Non-medical: Not on file  Tobacco Use  . Smoking status: Never Smoker  . Smokeless tobacco: Never Used  Substance and Sexual Activity  . Alcohol use: Yes    Comment: "occasional" when "hanging out with the wrong people" No recent use.  . Drug use: No    Types: Marijuana    Comment: Last in 2013  . Sexual activity: Not Currently  Lifestyle  . Physical activity    Days per week: Not on file    Minutes per session: Not on file  . Stress: Not on file  Relationships  . Social Herbalist on phone: Not on file    Gets together: Not on file    Attends religious  service: Not on file    Active member of club or organization: Not on file    Attends meetings of clubs or organizations: Not on file    Relationship status: Not on file  . Intimate partner violence    Fear of current or ex partner: Not on file    Emotionally abused: Not on file    Physically abused: Not on file    Forced sexual activity: Not on file  Other Topics Concern  . Not on file  Social History Narrative   Pt lives alone. Has no information on father.  Family History  Problem Relation Age of Onset  . Diabetes Mother   . Hypertension Mother   . Stroke Maternal Aunt   . Heart attack Neg Hx     Past Surgical History:  Procedure Laterality Date  . None      ROS: Review of Systems Negative except as stated above  PHYSICAL EXAM: BP 120/90 (BP Location: Right Arm, Patient Position: Sitting, Cuff Size: Normal)   Pulse 91   Temp 98.2 F (36.8 C) (Oral)   Resp 18   Ht _0  (1.778 m)   Wt 163 lb (73.9 kg)   SpO2 97%   BMI 23.39 kg/m   Physical Exam General appearance - alert, well appearing, and in no distress Mental status - normal mood, behavior, speech, dress, motor activity, and thought processes Mouth - mucous membranes moist, pharynx normal without lesions Neck - supple, no significant adenopathy Chest - clear to auscultation, no wheezes, rales or rhonchi, symmetric air entry Heart - normal rate, regular rhythm, normal S1, S2, no murmurs, rubs, clicks or gallops Extremities - peripheral pulses normal, no pedal edema, no clubbing or cyanosis Skin - patch of dry excoriated skin on the left lower anterior rib cage about 2 cm in size.    Some hyperpigmented patches on the upper back that looks like acanthosis nigricans  CMP Latest Ref Rng & Units 10/19/2018 07/02/2018 03/04/2017  Glucose 65 - 99 mg/dL - 360(H) 81  BUN 6 - 20 mg/dL - 17 15  Creatinine 0.76 - 1.27 mg/dL - 0.89 0.84  Sodium 134 - 144 mmol/L - 138 139  Potassium 3.5 - 5.2 mmol/L - 4.6 3.4(L)   Chloride 96 - 106 mmol/L - 99 105  CO2 20 - 29 mmol/L - 21 28  Calcium 8.7 - 10.2 mg/dL - 9.6 9.2  Total Protein 6.0 - 8.5 g/dL 7.3 7.1 7.1  Total Bilirubin 0.0 - 1.2 mg/dL 0.4 0.3 0.8  Alkaline Phos 39 - 117 IU/L 60 66 63  AST 0 - 40 IU/L _1 ALT 0 - 44 IU/L _2 Lipid Panel     Component Value Date/Time   CHOL 235 (H) 10/19/2018 0818   TRIG 221 (H) 10/19/2018 0818   HDL 37 (L) 10/19/2018 0818   CHOLHDL 6.4 (H) 10/19/2018 0818   CHOLHDL 7.8 (H) 04/17/2016 1204   VLDL 21 04/17/2016 1204   LDLCALC 154 (H) 10/19/2018 0818    CBC    Component Value Date/Time   WBC 8.1 03/04/2017 2256   RBC 4.90 03/04/2017 2256   HGB 14.2 03/04/2017 2256   HCT 41.7 03/04/2017 2256   PLT 262 03/04/2017 2256   MCV 85.1 03/04/2017 2256   MCH 29.0 03/04/2017 2256   MCHC 34.1 03/04/2017 2256   RDW 12.0 03/04/2017 2256   LYMPHSABS 2.6 11/14/2009 2120   MONOABS 0.4 11/14/2009 2120   EOSABS 0.1 11/14/2009 2120   BASOSABS 0.0 11/14/2009 2120   Results for orders placed or performed in visit on 01/18/19  HgB A1c  Result Value Ref Range   Hemoglobin A1C 13.8 (A) 4.0 - 5.6 %   HbA1c POC (<> result, manual entry)     HbA1c, POC (prediabetic range)     HbA1c, POC (controlled diabetic range)    Glucose (CBG)  Result Value Ref Range   POC Glucose 374 (A) 70 - 99 mg/dl    ASSESSMENT AND PLAN: 1. Type 2 diabetes mellitus with hyperglycemia, with long-term current use of insulin (  [place]) Uncontrolled due to noncompliance with medications. I tried to use motivational interviewing techniques to encourage patient to take medications.  We discussed some of the long-term complications of diabetes that can occur.  -Seeing that he is now uninsured, we will try to get the Trulicity for him through our pharmacy.  He tells me he still has a lot of his other medications that include Levemir, glipizide and metformin but I have sent those to our pharmacy also as it would be more cost effective for him  I  will also have our LCSW follow-up with him to discuss some of his barriers - HgB A1c - Glucose (CBG) - Dulaglutide (TRULICITY) 6.18 MQ/5.9CN SOPN; Inject 0.75 mg into the skin once a week.  Dispense: 4 pen; Refill: 4  2. Essential hypertension Not at goal.  Again discussed health risks associated with uncontrolled blood pressure.  Encourage compliance with medication  3. Need for influenza vaccination Given  4. Chronic systolic congestive heart failure (HCC) Compensated.  Encourage compliance with medications  5. Hyperlipidemia associated with type 2 diabetes mellitus (HCC) Encourage compliance with Crestor  6. Dermatitis Will need referral to dermatology.  Patient states he should be getting on his wife's insurance sometime next month.  He will let me know when to submit the referral  7. Decay, teeth Recommend that he see a dentist.  If he is unable to get onto his wife insurance, I encouraged him to apply for the orange card/cone discount  8. Noncompliance with medications Will refer to our LCSW    Patient was given the opportunity to ask questions.  Patient verbalized understanding of the plan and was able to repeat key elements of the plan.   Orders Placed This Encounter  Procedures  . HgB A1c  . Glucose (CBG)     Requested Prescriptions    No prescriptions requested or ordered in this encounter    No follow-ups on file.  Karle Plumber, MD, FACP

## 2019-01-18 NOTE — Patient Instructions (Signed)
Please apply for the orange card/the cone discount card.  Please take your medications as prescribed.  I have forwarded the prescription for Trulicity to our pharmacy.  I will also forward refills on your other medications to our pharmacy.  Try to get an eye exam when you are able to afford it.  I will have our clinical social worker follow-up with you.

## 2019-01-19 MED FILL — !LEVEMIR 100 UNITS/ML VIAL: 100/ML | 33 days supply | Qty: 10 | Fill #0

## 2019-01-19 MED FILL — metFORMIN HCL 1000 MG TABS: 1000 | 30 days supply | Qty: 60 | Fill #0

## 2019-01-19 MED FILL — glipiZIDE 10 MG TABS: 10 | 30 days supply | Qty: 60 | Fill #0

## 2019-01-25 ENCOUNTER — Other Ambulatory Visit: Payer: Self-pay

## 2019-01-25 ENCOUNTER — Other Ambulatory Visit: Payer: Self-pay | Admitting: *Deleted

## 2019-01-25 DIAGNOSIS — E785 Hyperlipidemia, unspecified: Secondary | ICD-10-CM

## 2019-01-25 LAB — LIPID PANEL
Chol/HDL Ratio: 9.9 ratio — ABNORMAL HIGH (ref 0.0–5.0)
Cholesterol, Total: 365 mg/dL — ABNORMAL HIGH (ref 100–199)
HDL: 37 mg/dL — ABNORMAL LOW (ref >39)
LDL Chol Calc (NIH): 262 mg/dL — ABNORMAL HIGH (ref 0–99)
Triglycerides: 299 mg/dL — ABNORMAL HIGH (ref 0–149)
VLDL Cholesterol Cal: 66 mg/dL — ABNORMAL HIGH (ref 5–40)

## 2019-02-08 ENCOUNTER — Telehealth: Payer: Self-pay | Admitting: Licensed Clinical Social Worker

## 2019-02-08 ENCOUNTER — Other Ambulatory Visit: Payer: Self-pay

## 2019-02-08 DIAGNOSIS — E785 Hyperlipidemia, unspecified: Secondary | ICD-10-CM

## 2019-02-08 MED ORDER — ROSUVASTATIN CALCIUM 40 MG PO TABS: 40.0000 mg | ORAL_TABLET | Freq: Every day | ORAL | 3 refills | Status: DC

## 2019-02-08 MED FILL — ROSUVASTATIN CALCIUM 40 MG: 40 | 30 days supply | Qty: 30 | Fill #0

## 2019-02-08 NOTE — Telephone Encounter (Signed)
Call placed to patient. LCSW introduced self and explained role at [facility].   Pt shared that he has difficulty obtaining medications; however, stated that he is not interested in information regarding PASS or Financial Counseling. He is not open to scheduling an appointment with LCSW. Pt disconnected phone call.   LCSW called patient back and left detailed message on voicemail to contact her with any resource needs that may arise.

## 2019-02-08 NOTE — Telephone Encounter (Signed)
Call placed to patient to follow up on consult from PCP. LCSW left message for a return call.

## 2019-02-11 MED FILL — !LEVEMIR 100 UNITS/ML VIAL: 100/ML | 33 days supply | Qty: 10 | Fill #0

## 2019-02-11 MED FILL — glipiZIDE 10 MG TABS: 10 | 30 days supply | Qty: 60 | Fill #0

## 2019-02-11 MED FILL — metFORMIN HCL 1000 MG TABS: 1000 | 30 days supply | Qty: 60 | Fill #0

## 2019-02-18 ENCOUNTER — Encounter: Payer: Self-pay | Admitting: Internal Medicine

## 2019-02-22 MED ORDER — CEPHALEXIN 500 MG PO CAPS: 500.0000 mg | ORAL_CAPSULE | Freq: Four times a day (QID) | ORAL | 0 refills | Status: DC

## 2019-02-22 MED FILL — CEPHALEXIN 500 MG CAPSULE: 500 | 7 days supply | Qty: 28 | Fill #0

## 2019-03-10 ENCOUNTER — Encounter (HOSPITAL_COMMUNITY): Payer: Self-pay | Admitting: *Deleted

## 2019-03-29 ENCOUNTER — Other Ambulatory Visit: Payer: Self-pay | Admitting: Internal Medicine

## 2019-03-29 ENCOUNTER — Encounter: Payer: Self-pay | Admitting: Internal Medicine

## 2019-03-29 DIAGNOSIS — I10 Essential (primary) hypertension: Secondary | ICD-10-CM

## 2019-03-29 DIAGNOSIS — E1165 Type 2 diabetes mellitus with hyperglycemia: Secondary | ICD-10-CM

## 2019-03-29 DIAGNOSIS — I5022 Chronic systolic (congestive) heart failure: Secondary | ICD-10-CM

## 2019-03-29 DIAGNOSIS — Z794 Long term (current) use of insulin: Secondary | ICD-10-CM

## 2019-03-29 MED FILL — !TRULICITY 0.75 MG/0.5 ML P: 0.75 | 28 days supply | Qty: 2 | Fill #1

## 2019-03-29 MED FILL — ROSUVASTATIN CALCIUM 40 MG: 40 | 30 days supply | Qty: 30 | Fill #1

## 2019-03-29 MED FILL — metFORMIN HCL 1000 MG TABS: 1000 | 30 days supply | Qty: 60 | Fill #1

## 2019-03-29 MED FILL — !LEVEMIR 100 UNITS/ML VIAL: 100/ML | 33 days supply | Qty: 10 | Fill #1

## 2019-03-29 MED FILL — glipiZIDE 10 MG TABS: 10 | 30 days supply | Qty: 60 | Fill #1

## 2019-03-30 NOTE — Telephone Encounter (Signed)
Please fill medications that are appropriate

## 2019-03-31 MED ORDER — CARVEDILOL 6.25 MG PO TABS: 9.3750 mg | ORAL_TABLET | Freq: Two times a day (BID) | ORAL | 3 refills | Status: DC

## 2019-03-31 MED ORDER — METFORMIN HCL 1000 MG PO TABS: ORAL_TABLET | ORAL | 1 refills | Status: DC

## 2019-03-31 MED FILL — CARVEDILOL 6.25 MG TABLET: 6.25 | 30 days supply | Qty: 90 | Fill #0

## 2019-04-26 ENCOUNTER — Ambulatory Visit: Payer: BC Managed Care – PPO | Attending: Internal Medicine | Admitting: Internal Medicine

## 2019-04-26 ENCOUNTER — Ambulatory Visit: Payer: BC Managed Care – PPO | Admitting: Pharmacist

## 2019-04-26 ENCOUNTER — Other Ambulatory Visit: Payer: Self-pay

## 2019-04-26 ENCOUNTER — Encounter: Payer: Self-pay | Admitting: Internal Medicine

## 2019-04-26 VITALS — BP 127/90 | HR 100 | Resp 16 | Wt 167.6 lb

## 2019-04-26 DIAGNOSIS — Z794 Long term (current) use of insulin: Secondary | ICD-10-CM | POA: Diagnosis not present

## 2019-04-26 DIAGNOSIS — E1165 Type 2 diabetes mellitus with hyperglycemia: Secondary | ICD-10-CM

## 2019-04-26 DIAGNOSIS — Z7189 Other specified counseling: Secondary | ICD-10-CM

## 2019-04-26 DIAGNOSIS — I428 Other cardiomyopathies: Secondary | ICD-10-CM

## 2019-04-26 DIAGNOSIS — I10 Essential (primary) hypertension: Secondary | ICD-10-CM

## 2019-04-26 DIAGNOSIS — E785 Hyperlipidemia, unspecified: Secondary | ICD-10-CM | POA: Diagnosis not present

## 2019-04-26 DIAGNOSIS — E1169 Type 2 diabetes mellitus with other specified complication: Secondary | ICD-10-CM

## 2019-04-26 DIAGNOSIS — Z9114 Patient's other noncompliance with medication regimen: Secondary | ICD-10-CM

## 2019-04-26 LAB — POCT GLYCOSYLATED HEMOGLOBIN (HGB A1C): HbA1c, POC (controlled diabetic range): 14.2 % — AB (ref 0.0–7.0)

## 2019-04-26 LAB — GLUCOSE, POCT (MANUAL RESULT ENTRY): POC Glucose: 241 mg/dl — AB (ref 70–99)

## 2019-04-26 MED ORDER — TRULICITY 0.75 MG/0.5ML ~~LOC~~ SOAJ: 0.7500 mg | SUBCUTANEOUS | 4 refills | Status: DC

## 2019-04-26 MED ORDER — ENTRESTO 49-51 MG PO TABS: 1.0000 | ORAL_TABLET | Freq: Two times a day (BID) | ORAL | 3 refills | Status: DC

## 2019-04-26 MED ORDER — PEN NEEDLES 31G X 8 MM MISC: 6 refills | Status: DC

## 2019-04-26 MED ORDER — INSULIN GLARGINE 100 UNIT/ML SOLOSTAR PEN: 30.0000 [IU] | PEN_INJECTOR | Freq: Every day | SUBCUTANEOUS | 11 refills | Status: DC

## 2019-04-26 MED ORDER — METFORMIN HCL 500 MG PO TABS: 500.0000 mg | ORAL_TABLET | Freq: Two times a day (BID) | ORAL | 3 refills | Status: DC

## 2019-04-26 NOTE — Progress Notes (Signed)
Patient ID: Jason Stevenson, male    DOB: 05-17-1985  MRN: [id number]  CC: Diabetes and Hypertension   Subjective: Jason Stevenson is a 34 y.o. male who presents for chronic ds management His concerns today include:  Patient with history of DM, HTN, HL,NICMwith systolic CHF EF 66-44% (echo 10/2018, Cardiologist Dr. Johnsie Cancel), chronic LBP, medication noncompliance  Pt last seen 01/2019 pt states he has been better compare to las visit in that he has been more compliant with meds. I did have the LCSW follow-up with him after our last visit to assess any barriers that he may have in getting his medications and taking them consistently.  Based on her note, the patient was not very open to discussion and ended the conversation abruptly.  He tells me that he now has insurance through his wife.  DIABETES TYPE 2 Last A1C:   Results for orders placed or performed in visit on 04/26/19  Glucose (CBG)  Result Value Ref Range   POC Glucose 241 (A) 70 - 99 mg/dl  HgB A1c  Result Value Ref Range   Hemoglobin A1C     HbA1c POC (<> result, manual entry)     HbA1c, POC (prediabetic range)     HbA1c, POC (controlled diabetic range) 14.2 (A) 0.0 - 7.0 %    Med Adherence:   Medication side effects:  Consistent with taking Trulicity. Admits that he sometimes skip Levemir. On avg taking Levemir about 4 x a wk.  When asked why he is not taking it consistently, he states "just the sticking part."  He was on insulin pens in the past.  He thinks the pen is faster and easier to do than the vials.  He currently has Levemir vials. Admits that he is not taking Metformin and Glucotrol consistently. Metformin up sets his stomach at times.  Home Monitoring?  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  However he quit working several weeks ago because he was having  to go in and out of the freezer. Feels the cold did some damage to toes.  Toes feel numb x 3-4 wks Hypoglycemic episodes?: _3  Yes    _4  No Numbness of the feet? _5  Yes    _6  No Retinopathy hx? _7  Yes    _8  No Last eye exam:  Over due.  Miss call for appt with ophthalmology.  He would like for [place] to resubmit the referral Comments:   HYPERTENSION/NICM Currently taking: see medication list Med Adherence: _9  Yes    _10  No - not consistent with taking meds.  "no good reason why." Medication side effects: _11  Yes    _12  No Adherence with salt restriction: _13  Yes    _14  No Home Monitoring?: _15  Yes    _16  No Monitoring Frequency: _17  Yes    _18  No Home BP results range: _19  Yes    _20  No SOB? _21  Yes    _22  No Chest Pain?: _23  Yes occasionally    _24  No Leg swelling?: _25  Yes    _26  No Headaches?: _27  Yes    _28  No Dizziness? _29  Yes    _30  No Comments:   HL:  LDL not at goal.  Not taking Crestor consistenly.  Patient Active Problem List   Diagnosis Date Noted  . Noncompliance with medication treatment  due to intermittent use of medication 10/12/2018  . Dyslipidemia 04/17/2016  . Uncontrolled type 2 diabetes mellitus without complication, with long-term current use of insulin 08/07/2015  . Chronic systolic CHF (congestive heart failure) ([place]) 06/18/2015  . Needs flu shot 12/23/2013  . Type 2 diabetes mellitus ([place]) 11/18/2013  . Diabetes ([place]) 06/17/2013  . Non-ischemic cardiomyopathy ([place]) 11/06/2012  . HTN (hypertension) 11/06/2012  . HLD (hyperlipidemia) 11/06/2012     Current Outpatient Medications on File Prior to Visit  Medication Sig Dispense Refill  . Blood Glucose Monitoring Suppl (TRUE METRIX METER) w/Device KIT 1 each by Does not apply route 3 (three) times daily. 1 kit 0  . Blood Pressure Monitor DEVI Use as directed to check home blood pressure 2-3 times a week 1 Device 0  . carvedilol (COREG) 6.25 MG tablet Take 1.5 tablets (9.375 mg total) by mouth 2 (two) times daily with a  meal. 90 tablet 3  . glipiZIDE (GLUCOTROL) 10 MG tablet Take 1 tablet (10 mg total) by mouth 2 (two) times daily before a meal. To lower blood sugar 60 tablet 4  . glucose blood test strip Use as instructed 100 each 12  . Insulin Syringe-Needle U-100 (RELION INSULIN SYRINGE) 31G X 15/64" 0.3 ML MISC Use to inject insulin daily. (Patient not taking: Reported on 01/18/2019) 100 each 11  . nitroGLYCERIN (NITROSTAT) 0.4 MG SL tablet Place 1 tablet (0.4 mg total) under the tongue every 5 (five) minutes as needed for chest pain. (Patient not taking: Reported on 01/18/2019) 25 tablet 2  . Omega-3 Fatty Acids (FISH OIL) 1000 MG CPDR Take 2,000 mg by mouth daily. (Patient not taking: Reported on 01/18/2019) 180 capsule 0  . rosuvastatin (CRESTOR) 40 MG tablet Take 1 tablet (40 mg total) by mouth daily. 90 tablet 3   No current facility-administered medications on file prior to visit.    No Known Allergies  Social History   Socioeconomic History  . Marital status: Married    Spouse name: Not on file  . Number of children: Not on file  . Years of education: Not on file  . Highest education level: Not on file  Occupational History  . Occupation: Chiropodist  Tobacco Use  . Smoking status: Never Smoker  . Smokeless tobacco: Never Used  Substance and Sexual Activity  . Alcohol use: Yes    Comment: "occasional" when "hanging out with the wrong people" No recent use.  . Drug use: No    Types: Marijuana    Comment: Last in 2013  . Sexual activity: Not Currently  Other Topics Concern  . Not on file  Social History Narrative   Pt lives alone. Has no information on father.   Social Determinants of Health   Financial Resource Strain:   . Difficulty of Paying Living Expenses: Not on file  Food Insecurity:   . Worried About Charity fundraiser in the Last Year: Not on file  . Ran Out of Food in the Last Year: Not on file  Transportation Needs:   . Lack of Transportation (Medical): Not on file   . Lack of Transportation (Non-Medical): Not on file  Physical Activity:   . Days of Exercise per Week: Not on file  . Minutes of Exercise per Session: Not on file  Stress:   . Feeling of Stress : Not on file  Social Connections:   . Frequency of Communication with Friends and Family: Not on file  . Frequency of Social Gatherings with  Friends and Family: Not on file  . Attends Religious Services: Not on file  . Active Member of Clubs or Organizations: Not on file  . Attends Archivist Meetings: Not on file  . Marital Status: Not on file  Intimate Partner Violence:   . Fear of Current or Ex-Partner: Not on file  . Emotionally Abused: Not on file  . Physically Abused: Not on file  . Sexually Abused: Not on file    Family History  Problem Relation Age of Onset  . Diabetes Mother   . Hypertension Mother   . Stroke Maternal Aunt   . Heart attack Neg Hx     Past Surgical History:  Procedure Laterality Date  . None      ROS: Review of Systems Negative except as stated above  PHYSICAL EXAM: BP 127/90   Pulse 100   Resp 16   Wt 167 lb 9.6 oz (76 kg)   SpO2 96%   BMI 24.05 kg/m   Wt Readings from Last 3 Encounters:  04/26/19 167 lb 9.6 oz (76 kg)  01/18/19 163 lb (73.9 kg)  12/04/18 167 lb (75.8 kg)   Physical Exam General appearance - alert, well appearing, and in no distress Mental status - normal mood, behavior, speech, dress, motor activity, and thought processes Neck - supple, no significant adenopathy Chest - clear to auscultation, no wheezes, rales or rhonchi, symmetric air entry Heart - normal rate, regular rhythm, normal S1, S2, no murmurs, rubs, clicks or gallops Extremities - peripheral pulses normal, no pedal edema, no clubbing or cyanosis Diabetic Foot Exam - Simple   Simple Foot Form Visual Inspection No deformities, no ulcerations, no other skin breakdown bilaterally: Yes Sensation Testing Intact to touch and monofilament testing  bilaterally: Yes Pulse Check Posterior Tibialis and Dorsalis pulse intact bilaterally: Yes Comments No discoloration of the toes.  Good capillary refill.  No signs of frostbite.     CMP Latest Ref Rng & Units 10/19/2018 07/02/2018 03/04/2017  Glucose 65 - 99 mg/dL - 360(H) 81  BUN 6 - 20 mg/dL - 17 15  Creatinine 0.76 - 1.27 mg/dL - 0.89 0.84  Sodium 134 - 144 mmol/L - 138 139  Potassium 3.5 - 5.2 mmol/L - 4.6 3.4(L)  Chloride 96 - 106 mmol/L - 99 105  CO2 20 - 29 mmol/L - 21 28  Calcium 8.7 - 10.2 mg/dL - 9.6 9.2  Total Protein 6.0 - 8.5 g/dL 7.3 7.1 7.1  Total Bilirubin 0.0 - 1.2 mg/dL 0.4 0.3 0.8  Alkaline Phos 39 - 117 IU/L 60 66 63  AST 0 - 40 IU/L _0 ALT 0 - 44 IU/L _1 Lipid Panel     Component Value Date/Time   CHOL 365 (H) 01/25/2019 0758   TRIG 299 (H) 01/25/2019 0758   HDL 37 (L) 01/25/2019 0758   CHOLHDL 9.9 (H) 01/25/2019 0758   CHOLHDL 7.8 (H) 04/17/2016 1204   VLDL 21 04/17/2016 1204   LDLCALC 262 (H) 01/25/2019 0758    CBC    Component Value Date/Time   WBC 8.1 03/04/2017 2256   RBC 4.90 03/04/2017 2256   HGB 14.2 03/04/2017 2256   HCT 41.7 03/04/2017 2256   PLT 262 03/04/2017 2256   MCV 85.1 03/04/2017 2256   MCH 29.0 03/04/2017 2256   MCHC 34.1 03/04/2017 2256   RDW 12.0 03/04/2017 2256   LYMPHSABS 2.6 11/14/2009 2120   MONOABS 0.4 11/14/2009 2120   EOSABS  0.1 11/14/2009 2120   BASOSABS 0.0 11/14/2009 2120    ASSESSMENT AND PLAN: 1. Type 2 diabetes mellitus with hyperglycemia, with long-term current use of insulin (HCC) -We discussed changing to the insulin pen if he feels that this will help him be more compliant with taking the medicine..  Looks like Lantus is the preferred insulin on his insurance.  Levemir changed to Lantus 30 units daily.  We decreased the Metformin from 1000 mg twice a day to 500 mg twice a day because of reports of GI upset with the higher dose.  Strongly encourage compliance with medications.  Encourage  healthy eating habits and regular exercise - Glucose (CBG) - HgB A1c - metFORMIN (GLUCOPHAGE) 500 MG tablet; Take 1 tablet (500 mg total) by mouth 2 (two) times daily with a meal. TAKE 1 TABLET BY MOUTH 2 TIMES DAILY WITH A MEAL  Dispense: 60 tablet; Refill: 3 - Insulin Glargine (LANTUS) 100 UNIT/ML Solostar Pen; Inject 30 Units into the skin daily.  Dispense: 15 mL; Refill: 11 - Insulin Pen Needle (PEN NEEDLES) 31G X 8 MM MISC; UAD  Dispense: 100 each; Refill: 6 - Ambulatory referral to Ophthalmology - Dulaglutide (TRULICITY) 0.98 JX/9.1YN SOPN; Inject 0.75 mg into the skin once a week.  Dispense: 4 pen; Refill: 4  2. Non-ischemic cardiomyopathy ([place]) 3. Essential hypertension -Diastolic blood pressure not at goal.  Encourage compliance with medications and low-salt diet - sacubitril-valsartan (ENTRESTO) 49-51 MG; Take 1 tablet by mouth 2 (two) times daily.  Dispense: 180 tablet; Refill: 3  4. Dyslipidemia associated with type 2 diabetes mellitus ([place]) Not at goal.  Encourage compliance with Crestor  5. Noncompliance with medication treatment due to intermittent use of medication Discussed health risks associated with uncontrolled diabetes, blood pressure and heart failure     Patient was given the opportunity to ask questions.  Patient verbalized understanding of the plan and was able to repeat key elements of the plan.   Orders Placed This Encounter  Procedures  . Ambulatory referral to Ophthalmology  . Glucose (CBG)  . HgB A1c     Requested Prescriptions   Signed Prescriptions Disp Refills  . metFORMIN (GLUCOPHAGE) 500 MG tablet 60 tablet 3    Sig: Take 1 tablet (500 mg total) by mouth 2 (two) times daily with a meal. TAKE 1 TABLET BY MOUTH 2 TIMES DAILY WITH A MEAL  . Insulin Glargine (LANTUS) 100 UNIT/ML Solostar Pen 15 mL 11    Sig: Inject 30 Units into the skin daily.  . Insulin Pen Needle (PEN NEEDLES) 31G X 8 MM MISC 100 each 6    Sig: UAD  . Dulaglutide  (TRULICITY) 8.29 FA/2.1HY SOPN 4 pen 4    Sig: Inject 0.75 mg into the skin once a week.  . sacubitril-valsartan (ENTRESTO) 49-51 MG 180 tablet 3    Sig: Take 1 tablet by mouth 2 (two) times daily.    Return in about 3 months (around 07/25/2019).  Karle Plumber, MD, FACP

## 2019-04-26 NOTE — Progress Notes (Signed)
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

## 2019-04-26 NOTE — Patient Instructions (Signed)
I encourage you to take your medications every day as prescribed.  Stop the Levemir.  We will change to Lantus (glargine) pen instead.  You will take 30 units daily.  Decrease Metformin to 500 mg twice a day.  I have submitted a referral for you to see an ophthalmologist.

## 2019-04-26 NOTE — Progress Notes (Signed)
Patient was educated on the use of the Lantus pen. Reviewed necessary supplies and operation of the pen. Also reviewed goal blood glucose levels. Patient was able to demonstrate use. All questions and concerns were addressed.  

## 2019-05-12 ENCOUNTER — Other Ambulatory Visit: Payer: Self-pay

## 2019-05-12 ENCOUNTER — Other Ambulatory Visit: Payer: Self-pay | Admitting: *Deleted

## 2019-05-12 DIAGNOSIS — E785 Hyperlipidemia, unspecified: Secondary | ICD-10-CM

## 2019-05-12 LAB — LIPID PANEL
Chol/HDL Ratio: 3.7 ratio (ref 0.0–5.0)
Cholesterol, Total: 201 mg/dL — ABNORMAL HIGH (ref 100–199)
HDL: 55 mg/dL (ref >39)
LDL Chol Calc (NIH): 127 mg/dL — ABNORMAL HIGH (ref 0–99)
Triglycerides: 104 mg/dL (ref 0–149)
VLDL Cholesterol Cal: 19 mg/dL (ref 5–40)

## 2019-07-20 ENCOUNTER — Other Ambulatory Visit: Payer: Self-pay

## 2019-07-20 ENCOUNTER — Ambulatory Visit: Payer: Self-pay | Attending: Internal Medicine | Admitting: Internal Medicine

## 2019-07-20 DIAGNOSIS — I428 Other cardiomyopathies: Secondary | ICD-10-CM

## 2019-07-20 DIAGNOSIS — E1169 Type 2 diabetes mellitus with other specified complication: Secondary | ICD-10-CM

## 2019-07-20 DIAGNOSIS — Z794 Long term (current) use of insulin: Secondary | ICD-10-CM

## 2019-07-20 DIAGNOSIS — E785 Hyperlipidemia, unspecified: Secondary | ICD-10-CM

## 2019-07-20 DIAGNOSIS — I10 Essential (primary) hypertension: Secondary | ICD-10-CM

## 2019-07-20 DIAGNOSIS — E1165 Type 2 diabetes mellitus with hyperglycemia: Secondary | ICD-10-CM

## 2019-07-20 DIAGNOSIS — Z9114 Patient's other noncompliance with medication regimen: Secondary | ICD-10-CM

## 2019-07-20 NOTE — Progress Notes (Signed)
Virtual Visit via Telephone Note Due to current restrictions/limitations of in-office visits due to the COVID-19 pandemic, this scheduled clinical appointment was converted to a telehealth visit  I connected with Jason Stevenson on 07/20/19 at 8:39 a.m by telephone and verified that I am speaking with the correct person using two identifiers. I am in my office.  The patient is at home.  Only the patient and myself participated in this encounter.  I discussed the limitations, risks, security and privacy concerns of performing an evaluation and management service by telephone and the availability of in person appointments. I also discussed with the patient that there may be a patient responsible charge related to this service. The patient expressed understanding and agreed to proceed.   History of Present Illness: Patient with history of DM, HTN, HL,NICMwith systolic CHF NL89-21%([facility] 10/2018, Cardiologist Dr. Johnsie Cancel), chronic LBP, medication noncompliance.  Last seen 04/2019.  Purpose of today's visit is chronic disease management.  DM: using Humalog and Levimer. Insurance at that time did not cover the Lantus and Trulicity Taking Lantus 30 units/day and Humalog SS (about 12 units) on avg taking it once a day with lunch.  Taking Metformin and Glucotrol once a day.  -checking BS once a day around lunch.  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  Will have insurance again in 3 mths with new job -feels he is doing better with eating habits.  Not eating much but not exercising due to [place] pandemic.  Working from home now.  wgh 167 lbs.  HTN/NICM: reports compliance with Coreg and Entresto.  No device to check BP -limits salt intake No SOB. Had a quick sharp pain in chest one day.  No recurrence.  No LE edema Compliant with Crestor  HM: had 1st COVID 19 Moderna vaccine about 2 wks Outpatient  Encounter Medications as of 07/20/2019  Medication Sig  . Blood Glucose Monitoring Suppl (TRUE METRIX METER) w/Device KIT 1 each by Does not apply route 3 (three) times daily.  . Blood Pressure Monitor DEVI Use as directed to check home blood pressure 2-3 times a week  . carvedilol (COREG) 6.25 MG tablet Take 1.5 tablets (9.375 mg total) by mouth 2 (two) times daily with a meal.  . Dulaglutide (TRULICITY) 7.40 CX/4.4YJ SOPN Inject 0.75 mg into the skin once a week.  Marland Kitchen glipiZIDE (GLUCOTROL) 10 MG tablet Take 1 tablet (10 mg total) by mouth 2 (two) times daily before a meal. To lower blood sugar  . glucose blood test strip Use as instructed  . Insulin Glargine (LANTUS) 100 UNIT/ML Solostar Pen Inject 30 Units into the skin daily.  . Insulin Pen Needle (PEN NEEDLES) 31G X 8 MM MISC UAD  . Insulin Syringe-Needle U-100 (RELION INSULIN SYRINGE) 31G X 15/64" 0.3 ML MISC Use to inject insulin daily. (Patient not taking: Reported on 01/18/2019)  . metFORMIN (GLUCOPHAGE) 500 MG tablet Take 1 tablet (500 mg total) by mouth 2 (two) times daily with a meal. TAKE 1 TABLET BY MOUTH 2 TIMES DAILY WITH A MEAL  . nitroGLYCERIN (NITROSTAT) 0.4 MG SL tablet Place 1 tablet (0.4 mg total) under the tongue every 5 (five) minutes as needed for chest pain. (Patient not taking: Reported on 01/18/2019)  . Omega-3 Fatty Acids (FISH OIL) 1000 MG CPDR Take 2,000 mg by mouth daily. (Patient not taking: Reported on 01/18/2019)  . rosuvastatin (CRESTOR) 40 MG  tablet Take 1 tablet (40 mg total) by mouth daily.  . sacubitril-valsartan (ENTRESTO) 49-51 MG Take 1 tablet by mouth 2 (two) times daily.   No facility-administered encounter medications on file as of 07/20/2019.    Observations/Objective: Results for orders placed or performed in visit on 05/12/19  Lipid panel  Result Value Ref Range   Cholesterol, Total 201 (H) 100 - 199 mg/dL   Triglycerides 104 0 - 149 mg/dL   HDL 55 >39 mg/dL   VLDL Cholesterol Cal 19 5 - 40 mg/dL    LDL Chol Calc (NIH) 127 (H) 0 - 99 mg/dL   Chol/HDL Ratio 3.7 0.0 - 5.0 ratio     Assessment and Plan: 1. Type 2 diabetes mellitus with hyperglycemia, with long-term current use of insulin (HCC) -Blood sugars not at goal.  Patient still has issues with compliance.  Encourage compliance with medications to prevent further complications from diabetes.  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  I have removed Trulicity and Lantus off the med list and updated the dose of Levemir and Humalog that he tells me he is taking.  Encourage him to take the Metformin and Glucotrol as prescribed.  Encouraged him to get the eye exam done as soon as he gets insurance. Discussed healthy eating habits.  Discussed importance of regular exercise.  Advised of ways to exercise at home like walking in place - Basic metabolic panel; Future - Microalbumin / creatinine urine ratio; Future  2. Essential hypertension Level of control unknown.  Encourage him to continue taking his medications as prescribed  3. Noncompliance with medications   4. Hyperlipidemia associated with type 2 diabetes mellitus (HCC) Continue Crestor  5. Non-ischemic cardiomyopathy ([place]) Stable based on symptoms.  Continue carvedilol and Entresto   Follow Up Instructions: 3-4 mths   I discussed the assessment and treatment plan with the patient. The patient was provided an opportunity to ask questions and all were answered. The patient agreed with the plan and demonstrated an understanding of the instructions.   The patient was advised to call back or seek an in-person evaluation if the symptoms worsen or if the condition fails to improve as anticipated.  I provided 16 minutes of non-face-to-face time during this encounter.   Karle Plumber, MD

## 2019-07-24 ENCOUNTER — Encounter: Payer: Self-pay | Admitting: Internal Medicine

## 2019-08-04 ENCOUNTER — Encounter: Payer: Self-pay | Admitting: Internal Medicine

## 2019-08-04 ENCOUNTER — Other Ambulatory Visit: Payer: Self-pay

## 2019-08-04 ENCOUNTER — Ambulatory Visit: Payer: Self-pay | Attending: Internal Medicine | Admitting: Pharmacist

## 2019-08-04 DIAGNOSIS — I428 Other cardiomyopathies: Secondary | ICD-10-CM

## 2019-08-04 DIAGNOSIS — Z794 Long term (current) use of insulin: Secondary | ICD-10-CM

## 2019-08-04 DIAGNOSIS — I10 Essential (primary) hypertension: Secondary | ICD-10-CM

## 2019-08-04 DIAGNOSIS — I5022 Chronic systolic (congestive) heart failure: Secondary | ICD-10-CM

## 2019-08-04 DIAGNOSIS — E1165 Type 2 diabetes mellitus with hyperglycemia: Secondary | ICD-10-CM

## 2019-08-04 MED ORDER — CARVEDILOL 6.25 MG PO TABS: 9.3750 mg | ORAL_TABLET | Freq: Two times a day (BID) | ORAL | 2 refills | Status: DC

## 2019-08-04 MED ORDER — ENTRESTO 49-51 MG PO TABS: 1.0000 | ORAL_TABLET | Freq: Two times a day (BID) | ORAL | 2 refills | Status: DC

## 2019-08-04 MED FILL — ENTRESTO 49 MG-51 MG TABLET: 49-51 | 30 days supply | Qty: 60 | Fill #0

## 2019-08-04 MED FILL — CARVEDILOL 6.25 MG TABLET: 6.25 | 30 days supply | Qty: 90 | Fill #0

## 2019-08-04 NOTE — Progress Notes (Signed)
   S:    PCP: Dr. Laural Benes  Patient arrives in good spirits. Presents to the clinic for hypertension evaluation, counseling, and management.  Patient was referred and last seen by Primary Care Provider on 08/04/2019.   Denies chest pain, dyspnea, or LE edema. Does endorse a HA that started yesterday. Reports pain as constant in the front of his head and behind the eyes. No blurred vision or dizziness. Has used Tylenol x1 with no relief.   Patient denies adherence with medications. Waiting for benefits/insurance to become active.   Current BP Medications include:  Carvedilol 9.37 mg BID (1.5 of the 6.25 mg tabs), Entresto 49-51 mg BID  Dietary habits include: limits salt intake; denies drinking excessive caffeine.  Exercise habits include: not exercising due to pandemic  Family / Social history:  - FHx: HTN, DM, stroke - Tobacco: never smoker - Alcohol: occasional use  O:  Vitals:   08/04/19 0936  BP: (!) 136/96  Pulse: 88    Home BP readings: none   Last 3 Office BP readings: BP Readings from Last 3 Encounters:  08/04/19 (!) 136/96  04/26/19 127/90  01/18/19 120/90    BMET    Component Value Date/Time   NA 138 07/02/2018 1115   K 4.6 07/02/2018 1115   CL 99 07/02/2018 1115   CO2 21 07/02/2018 1115   GLUCOSE 360 (H) 07/02/2018 1115   GLUCOSE 81 03/04/2017 2256   BUN 17 07/02/2018 1115   CREATININE 0.89 07/02/2018 1115   CREATININE 0.80 04/17/2016 1204   CALCIUM 9.6 07/02/2018 1115   GFRNONAA 112 07/02/2018 1115   GFRNONAA >89 04/17/2016 1204   GFRAA 129 07/02/2018 1115   GFRAA >89 04/17/2016 1204    Renal function: CrCl cannot be calculated (Patient's most recent lab result is older than the maximum 21 days allowed.).  Clinical ASCVD: No  The ASCVD Risk score Denman George DC Jr., et al., 2013) failed to calculate for the following reasons:   The 2013 ASCVD risk score is only valid for ages 40 to 66   A/P: Hypertension longstanding currently above goal on current  medications. BP Goal = <130/80 mmHg. Patient is out of medications d/t lack of insurance. I have placed refills at our pharmacy today. No regimen changes.   -Continued current regimen; refills sent.  -F/u labs ordered - BMP per PCP -Counseled on lifestyle modifications for blood pressure control including reduced dietary sodium, increased exercise, adequate sleep  Results reviewed and written information provided.   Total time in face-to-face counseling 15 minutes.   F/U Clinic Visit with PCP.   Butch Penny, PharmD, CPP Clinical Pharmacist [facility] [phone]

## 2019-08-05 ENCOUNTER — Encounter: Payer: Self-pay | Admitting: Pharmacist

## 2019-08-05 LAB — BASIC METABOLIC PANEL
BUN/Creatinine Ratio: 15 (ref 9–20)
BUN: 11 mg/dL (ref 6–20)
CO2: 24 mmol/L (ref 20–29)
Calcium: 9.6 mg/dL (ref 8.7–10.2)
Chloride: 98 mmol/L (ref 96–106)
Creatinine, Ser: 0.74 mg/dL — ABNORMAL LOW (ref 0.76–1.27)
GFR calc Af Amer: 138 mL/min/{1.73_m2} (ref >59)
GFR calc non Af Amer: 120 mL/min/{1.73_m2} (ref >59)
Glucose: 303 mg/dL — ABNORMAL HIGH (ref 65–99)
Potassium: 4.2 mmol/L (ref 3.5–5.2)
Sodium: 139 mmol/L (ref 134–144)

## 2019-08-05 LAB — MICROALBUMIN / CREATININE URINE RATIO
Creatinine, Urine: 76.5 mg/dL
Microalb/Creat Ratio: 6 mg/g creat (ref 0–29)
Microalbumin, Urine: 4.5 ug/mL

## 2019-08-06 ENCOUNTER — Encounter (HOSPITAL_COMMUNITY): Payer: Self-pay

## 2019-08-06 ENCOUNTER — Emergency Department (HOSPITAL_COMMUNITY)
Admission: EM | Admit: 2019-08-06 | Discharge: 2019-08-06 | Disposition: A | Discharge status: Home / Self Care | Payer: Self-pay | Attending: Emergency Medicine | Admitting: Emergency Medicine

## 2019-08-06 ENCOUNTER — Other Ambulatory Visit: Payer: Self-pay

## 2019-08-06 ENCOUNTER — Emergency Department (HOSPITAL_COMMUNITY): Payer: Self-pay

## 2019-08-06 DIAGNOSIS — Z794 Long term (current) use of insulin: Secondary | ICD-10-CM | POA: Insufficient documentation

## 2019-08-06 DIAGNOSIS — I110 Hypertensive heart disease with heart failure: Secondary | ICD-10-CM | POA: Insufficient documentation

## 2019-08-06 DIAGNOSIS — I5032 Chronic diastolic (congestive) heart failure: Secondary | ICD-10-CM | POA: Insufficient documentation

## 2019-08-06 DIAGNOSIS — I861 Scrotal varices: Secondary | ICD-10-CM | POA: Insufficient documentation

## 2019-08-06 DIAGNOSIS — Z79899 Other long term (current) drug therapy: Secondary | ICD-10-CM | POA: Insufficient documentation

## 2019-08-06 DIAGNOSIS — E119 Type 2 diabetes mellitus without complications: Secondary | ICD-10-CM | POA: Insufficient documentation

## 2019-08-06 DIAGNOSIS — M62830 Muscle spasm of back: Secondary | ICD-10-CM | POA: Insufficient documentation

## 2019-08-06 LAB — URINALYSIS, ROUTINE W REFLEX MICROSCOPIC
Bacteria, UA: NONE SEEN
Bilirubin Urine: NEGATIVE
Glucose, UA: >=500 mg/dL — AB
Hgb urine dipstick: NEGATIVE
Ketones, ur: 20 mg/dL — AB
Leukocytes,Ua: NEGATIVE
Nitrite: NEGATIVE
Protein, ur: NEGATIVE mg/dL
Specific Gravity, Urine: 1.036 — ABNORMAL HIGH (ref 1.005–1.030)
pH: 5 (ref 5.0–8.0)

## 2019-08-06 MED ORDER — KETOROLAC TROMETHAMINE 30 MG/ML IJ SOLN
30.0000 mg | Freq: Once | INTRAMUSCULAR | Status: AC
  Administered 2019-08-06: 10:00:00 30 mg via INTRAMUSCULAR
  Filled 2019-08-06: qty 1

## 2019-08-06 MED ORDER — NAPROXEN 375 MG PO TABS: 375.0000 mg | ORAL_TABLET | Freq: Two times a day (BID) | ORAL | 0 refills | Status: AC

## 2019-08-06 MED ORDER — METHOCARBAMOL 750 MG PO TABS: 750.0000 mg | ORAL_TABLET | Freq: Every evening | ORAL | 0 refills | Status: AC | PRN

## 2019-08-06 NOTE — Discharge Instructions (Addendum)
You may alternate taking Tylenol and Naproxen as needed for pain control. You may take Naproxen twice daily as directed on your discharge paperwork and you may take  [phone] mg of Tylenol every 6 hours. Do not exceed 4000 mg of Tylenol daily as this can lead to liver damage. Also, make sure to take Naproxen with meals as it can cause an upset stomach. Do not take other NSAIDs while taking Naproxen such as (Aleve, Ibuprofen, Aspirin, Celebrex, etc) and do not take more than the prescribed dose as this can lead to ulcers and bleeding in your GI tract. You may use warm and cold compresses to help with your symptoms.   You were given a prescription for Robaxin which is a muscle relaxer.  You should not drive, work, or operate machinery while taking this medication as it can make you very drowsy.    Please follow up with your primary doctor within the next 7-10 days for re-evaluation and further treatment of your symptoms. You can follow up with urology in regards to your testicle pain.  Please return to the ER sooner if you have any new or worsening symptoms.

## 2019-08-06 NOTE — ED Provider Notes (Signed)
[facility] EMERGENCY DEPARTMENT Provider Note   CSN: [id number] Arrival date & time: 08/06/19  0923     History Chief Complaint  Patient presents with  . Back Pain    Jason Stevenson is a 34 y.o. male.  HPI   Pt is a a 34 y/o male with a h/o CHF, DM, HLD, HTN, who presents to the ED today for eval of right mid/lower back pain and left groin/testicle pain. that started  Back pain: Pain is located to the right mid/lower back. Pain is constant. Rates pain 7/10. Pain feels sharp at times and nagging during other times. Pain is worse when he stretches his leg out and when he gets up out of bed. Denies any recent falls or trauma. States he cut the grass last week and when he pulled the lawn mower it made his pain worse. Denies fevers, nausea, vomiting, constipation, shortness of breath, pleuritic pain, dysuria, frequency, urgency or hematuria. Pt has some numbness in his toes (states this happened after working in a fevers many months ago) he otherwise denies any numbness/tingling/weakness to the BLE. Denies saddle anesthesia. Denies loss of control of bowels or bladder. No urinary retention. No fevers. Denies a h/o IVDU. Denies a h/o CA.  Testicle/groin pain: He also reports left groin and testicle pain that started many months ago. Pain has been constant. Pain feels sharp. Pain is currently rated 7/10. He also rates the pain as moderate. Pain is worse when he sleeps on his left side. Denies penile discharge. He has intermittent swelling to the left testicle. He denies any redness to the testicle.    Past Medical History:  Diagnosis Date  . Chronic systolic CHF (congestive heart failure) ([place]) 06/18/2015  . Diabetes mellitus without complication ([place])   . Hyperlipidemia   . Hypertension   . Nonischemic cardiomyopathy ([place]) Noted as early as 2007   Per chart review (cards consult note 2011), EF of 40% in 2007, down to 20-25% in 2011    Patient Active Problem List    Diagnosis Date Noted  . Noncompliance with medication treatment due to intermittent use of medication 10/12/2018  . Dyslipidemia 04/17/2016  . Uncontrolled type 2 diabetes mellitus without complication, with long-term current use of insulin 08/07/2015  . Chronic systolic CHF (congestive heart failure) ([place]) 06/18/2015  . Needs flu shot 12/23/2013  . Type 2 diabetes mellitus ([place]) 11/18/2013  . Diabetes ([place]) 06/17/2013  . Non-ischemic cardiomyopathy ([place]) 11/06/2012  . HTN (hypertension) 11/06/2012  . HLD (hyperlipidemia) 11/06/2012    Past Surgical History:  Procedure Laterality Date  . None         Family History  Problem Relation Age of Onset  . Diabetes Mother   . Hypertension Mother   . Stroke Maternal Aunt   . Heart attack Neg Hx     Social History   Tobacco Use  . Smoking status: Never Smoker  . Smokeless tobacco: Never Used  Substance Use Topics  . Alcohol use: Yes    Comment: "occasional" when "hanging out with the wrong people" No recent use.  . Drug use: No    Types: Marijuana    Comment: Last in 2013    Home Medications Prior to Admission medications   Medication Sig Start Date End Date Taking? Authorizing Provider  Blood Glucose Monitoring Suppl (TRUE METRIX METER) w/Device KIT 1 each by Does not apply route 3 (three) times daily. 07/02/18   Antony Blackbird, MD  Blood Pressure Monitor DEVI Use  as directed to check home blood pressure 2-3 times a week 10/12/18   Ladell Pier, MD  carvedilol (COREG) 6.25 MG tablet Take 1.5 tablets (9.375 mg total) by mouth 2 (two) times daily with a meal. 08/04/19   Ladell Pier, MD  glipiZIDE (GLUCOTROL) 10 MG tablet Take 1 tablet (10 mg total) by mouth 2 (two) times daily before a meal. To lower blood sugar 01/18/19   Karle Plumber B, MD  glucose blood test strip Use as instructed 07/02/18   Fulp, Cammie, MD  insulin detemir (LEVEMIR) 100 UNIT/ML injection Inject 30 Units into the skin daily.    [provider]  insulin lispro (HUMALOG) 100 UNIT/ML injection Inject into the skin 3 (three) times daily before meals. Patient using sliding scale insulin    [provider]  Insulin Pen Needle (PEN NEEDLES) 31G X 8 MM MISC UAD 04/26/19   Ladell Pier, MD  Insulin Syringe-Needle U-100 (RELION INSULIN SYRINGE) 31G X 15/64" 0.3 ML MISC Use to inject insulin daily. Patient not taking: Reported on 01/18/2019 07/13/18   Ladell Pier, MD  metFORMIN (GLUCOPHAGE) 500 MG tablet Take 1 tablet (500 mg total) by mouth 2 (two) times daily with a meal. TAKE 1 TABLET BY MOUTH 2 TIMES DAILY WITH A MEAL 04/26/19   Ladell Pier, MD  methocarbamol (ROBAXIN) 750 MG tablet Take 1 tablet (750 mg total) by mouth at bedtime as needed for up to 5 days for muscle spasms. 08/06/19 08/11/19  Keanon Bevins S, PA-C  naproxen (NAPROSYN) 375 MG tablet Take 1 tablet (375 mg total) by mouth 2 (two) times daily for 5 days. 08/06/19 08/11/19  Jamelyn Bovard S, PA-C  nitroGLYCERIN (NITROSTAT) 0.4 MG SL tablet Place 1 tablet (0.4 mg total) under the tongue every 5 (five) minutes as needed for chest pain. Patient not taking: Reported on 01/18/2019 02/20/15   Josue Hector, MD  Omega-3 Fatty Acids (FISH OIL) 1000 MG CPDR Take 2,000 mg by mouth daily. Patient not taking: Reported on 01/18/2019 07/08/18   Ladell Pier, MD  rosuvastatin (CRESTOR) 40 MG tablet Take 1 tablet (40 mg total) by mouth daily. 02/08/19   Imogene Burn, PA-C  sacubitril-valsartan (ENTRESTO) 49-51 MG Take 1 tablet by mouth 2 (two) times daily. 08/04/19   Ladell Pier, MD    Allergies    Patient has no known allergies.  Review of Systems   Review of Systems  Constitutional: Negative for chills and fever.  HENT: Negative for ear pain and sore throat.   Eyes: Negative for visual disturbance.  Respiratory: Negative for cough and shortness of breath.   Cardiovascular: Negative for chest pain.  Gastrointestinal: Negative for  abdominal pain, diarrhea, nausea and vomiting.  Genitourinary: Positive for flank pain, scrotal swelling and testicular pain. Negative for discharge, dysuria, frequency, hematuria and penile swelling.  Musculoskeletal: Positive for back pain.  Skin: Negative for color change and rash.  Neurological: Positive for numbness (toes). Negative for weakness.  All other systems reviewed and are negative.   Physical Exam Updated Vital Signs BP (!) 118/96   Pulse 99   Temp 98.2 F (36.8 C) (Oral)   Resp 16   Ht _0  (1.803 m)   Wt 75.8 kg   SpO2 99%   BMI 23.29 kg/m   Physical Exam Vitals and nursing note reviewed.  Constitutional:      Appearance: He is well-developed.  HENT:     Head: Normocephalic and atraumatic.  Eyes:     Conjunctiva/sclera: Conjunctivae normal.  Cardiovascular:     Rate and Rhythm: Normal rate and regular rhythm.     Heart sounds: No murmur.  Pulmonary:     Effort: Pulmonary effort is normal. No respiratory distress.     Breath sounds: Normal breath sounds. No wheezing, rhonchi or rales.  Abdominal:     General: Bowel sounds are normal.     Palpations: Abdomen is soft.     Tenderness: There is no abdominal tenderness. There is no right CVA tenderness, left CVA tenderness, guarding or rebound.  Genitourinary:    Comments: Chaperone present. Testicles are wnl bilaterally. There is minimal TTP to the left testicle. No erythema or swelling. Normal uncircumcised penis. No lesions or skin changes Musculoskeletal:     Cervical back: Neck supple.     Comments: No TTP to the thoracic or lumbar spine. TTP to the right thoracic and lumbar paraspinous muscles which reproduces his pain.   Skin:    General: Skin is warm and dry.  Neurological:     Mental Status: He is alert.     ED Results / Procedures / Treatments   Labs (all labs ordered are listed, but only abnormal results are displayed) Labs Reviewed  URINALYSIS, ROUTINE W REFLEX MICROSCOPIC - Abnormal;  Notable for the following components:      Result Value   Specific Gravity, Urine 1.036 (*)    Glucose, UA >=500 (*)    Ketones, ur 20 (*)    All other components within normal limits    EKG None  Radiology US SCROTUM W/DOPPLER  Result Date: 08/06/2019 CLINICAL DATA:  34 year old male with left testicular pain for 3 months. EXAM: SCROTAL ULTRASOUND DOPPLER ULTRASOUND OF THE TESTICLES TECHNIQUE: Complete ultrasound examination of the testicles, epididymis, and other scrotal structures was performed. Color and spectral Doppler ultrasound were also utilized to evaluate blood flow to the testicles. COMPARISON:  None. FINDINGS: Right testicle Measurements: 3.8 x 2.1 x 2.7 cm. No mass or microlithiasis visualized. Left testicle Measurements: 3.9 x 1.7 x 2.5 cm. No mass or microlithiasis visualized. Right epididymis:  Normal in size and appearance. Left epididymis:  Normal in size and appearance. Hydrocele:  None visualized. Varicocele: Bilateral pan pinna form plexus veins are at the upper limits of normal to mildly enlarged. Pulsed Doppler interrogation of both testes demonstrates normal low resistance arterial and venous waveforms bilaterally. IMPRESSION: 1. Borderline to mild bilateral varicocele. 2. Otherwise negative scrotal ultrasound with Doppler, no testicular mass or torsion. Electronically Signed   By: Genevie Ann M.D.   On: 08/06/2019 11:12    Procedures Procedures (including critical care time)  Medications Ordered in ED Medications  ketorolac (TORADOL) 30 MG/ML injection 30 mg (30 mg Intramuscular Given 08/06/19 1017)    ED Course  I have reviewed the triage vital signs and the nursing notes.  Pertinent labs & imaging results that were available during my care of the patient were reviewed by me and considered in my medical decision making (see chart for details).    MDM Rules/Calculators/A&P                      34 y/o male with back pain and testicle pain, both ongoing for  months.   Back pain:No neurological deficits and normal neuro exam.  Patient can walk but states is painful, it is also painful to lift his leg.  No loss of bowel or bladder control.  No concern for  cauda equina.  No fever, night sweats, h/o cancer, IVDU. This seems very consistent with muscle spasm of the paraspinous muscle to the right side of the back.He does not have any CVA TTP and sxs do not sound consistent with nephrolithiasis, pyelonephritis, or other emergent intraabdominal pathology.  UA is w/o hematuria.  Muscle relaxer and pain medicine indicated and discussed with patient.   Testicle pain: Pain currently improved. Minimal TTP to the left testicle on exam. No signs of soft tissue infection on exam. Less likely epididymitis given low degree of pain currently. May be hydrocele or varicocele. Will get [place] to r/o emergency cause. US scrotum showed bilat varicocoeles which is likely the source of his discomfort. Discussed that he can f/u with urology if he continues to have sxs.   Toradol given in the ED. On reassessment, he is feeling improvement after toradol.   Suspect back pain is related to muscle spasm. Naproxen and robaxin rx given. Advised pcp f/u and advised on strict return precautions. He voiced understanding of the plan and reasons to return. All questions answered, pt stable for discharge.   Final Clinical Impression(s) / ED Diagnoses Final diagnoses:  Muscle spasm of back  Varicocele    Rx / DC Orders ED Discharge Orders         Ordered    naproxen (NAPROSYN) 375 MG tablet  2 times daily     08/06/19 1301    methocarbamol (ROBAXIN) 750 MG tablet  At bedtime PRN     08/06/19 1301           Ayrianna Mcginniss S, PA-C 08/06/19 1302    Tegeler, Gwenyth Allegra, MD 08/06/19 [phone]

## 2019-08-06 NOTE — ED Notes (Signed)
Pt verbalized understanding of discharge instructions. Prescriptions and follow up care reviewed, pt had no further questions. 

## 2019-08-06 NOTE — ED Triage Notes (Signed)
Pt reports lower back pain for several months, pt reports increase pain to the area when he straightens out his Right leg. Pt reports not having insurance and started a new job, he sits in a chair all day has increase pain when standing

## 2019-08-12 ENCOUNTER — Telehealth: Payer: Self-pay | Admitting: Internal Medicine

## 2019-08-12 NOTE — Telephone Encounter (Signed)
Having bad headache pt stated it is behind eyebrow on left side and can you please follow up

## 2019-08-12 NOTE — Telephone Encounter (Signed)
Pt returned call pt states has a headache on the left side more so like left temple behind eye pain level is a 10. Pt states he has taken Tylenol and Naproxen with no relief. Pt states he gets slight relief but the pain is still there. Pt states the headache just started back today. Pt states he took his bp today and reading was 122/98. Pt is wanting meds send to [facility] on [address]. Pt denies a history of headaches. Pt states the last time he had a headache was when he had his last COVID Vaccine.

## 2019-08-12 NOTE — Telephone Encounter (Signed)
Returned pt call to get more information. Pt didn't answer left a vm asking pt to give a call back but in the meantime I will forward to message to pcp

## 2019-08-16 NOTE — Telephone Encounter (Signed)
Contacted pt to schedule a in person visit pt didn't answer lvm asking pt to give [place] a call to schedule with any provider that has an opening

## 2019-09-08 ENCOUNTER — Encounter: Payer: Self-pay | Admitting: Internal Medicine

## 2019-11-25 ENCOUNTER — Other Ambulatory Visit: Payer: Self-pay

## 2019-11-25 ENCOUNTER — Ambulatory Visit: Payer: Self-pay | Attending: Internal Medicine | Admitting: Internal Medicine

## 2019-11-25 DIAGNOSIS — I10 Essential (primary) hypertension: Secondary | ICD-10-CM

## 2019-11-25 DIAGNOSIS — E1169 Type 2 diabetes mellitus with other specified complication: Secondary | ICD-10-CM

## 2019-11-25 DIAGNOSIS — I5022 Chronic systolic (congestive) heart failure: Secondary | ICD-10-CM

## 2019-11-25 DIAGNOSIS — E785 Hyperlipidemia, unspecified: Secondary | ICD-10-CM

## 2019-11-25 DIAGNOSIS — Z794 Long term (current) use of insulin: Secondary | ICD-10-CM

## 2019-11-25 DIAGNOSIS — I428 Other cardiomyopathies: Secondary | ICD-10-CM

## 2019-11-25 DIAGNOSIS — E1142 Type 2 diabetes mellitus with diabetic polyneuropathy: Secondary | ICD-10-CM

## 2019-11-25 MED ORDER — CARVEDILOL 6.25 MG PO TABS: 9.3750 mg | ORAL_TABLET | Freq: Two times a day (BID) | ORAL | 6 refills | Status: DC

## 2019-11-25 MED ORDER — ENTRESTO 49-51 MG PO TABS: 1.0000 | ORAL_TABLET | Freq: Two times a day (BID) | ORAL | 2 refills | Status: DC

## 2019-11-25 MED ORDER — INSULIN DETEMIR 100 UNIT/ML ~~LOC~~ SOLN: 30.0000 [IU] | Freq: Every day | SUBCUTANEOUS | 5 refills | Status: DC

## 2019-11-25 MED ORDER — INSULIN LISPRO 100 UNIT/ML ~~LOC~~ SOLN: SUBCUTANEOUS | 6 refills | Status: DC

## 2019-11-25 MED ORDER — ROSUVASTATIN CALCIUM 40 MG PO TABS: 40.0000 mg | ORAL_TABLET | Freq: Every day | ORAL | 3 refills | Status: DC

## 2019-11-25 MED ORDER — METFORMIN HCL 500 MG PO TABS: 500.0000 mg | ORAL_TABLET | Freq: Two times a day (BID) | ORAL | 6 refills | Status: DC

## 2019-11-25 MED ORDER — GLIPIZIDE 10 MG PO TABS: 10.0000 mg | ORAL_TABLET | Freq: Two times a day (BID) | ORAL | 4 refills | Status: DC

## 2019-11-25 MED ORDER — GABAPENTIN 300 MG PO CAPS: 300.0000 mg | ORAL_CAPSULE | Freq: Every day | ORAL | 3 refills | Status: DC

## 2019-11-25 MED FILL — ENTRESTO 49 MG-51 MG TABLET: 49-51 | 30 days supply | Qty: 60 | Fill #0

## 2019-11-25 MED FILL — ?HUMALOG 100 UNITS/ML VIAL: 100 | 16 days supply | Qty: 10 | Fill #0

## 2019-11-25 MED FILL — CARVEDILOL 6.25 MG TABLET: 6.25 | 30 days supply | Qty: 90 | Fill #0

## 2019-11-25 MED FILL — GABAPENTIN 300 MG CAPSULE: 300 | 30 days supply | Qty: 30 | Fill #0

## 2019-11-25 MED FILL — !LEVEMIR 100 UNITS/ML VIAL: 100/ML | 33 days supply | Qty: 10 | Fill #0

## 2019-11-25 MED FILL — glipiZIDE 10 MG TABS: 10 | 30 days supply | Qty: 60 | Fill #0

## 2019-11-25 MED FILL — METFORMIN HCL 500 MG TABS: 500 | 30 days supply | Qty: 60 | Fill #0

## 2019-11-25 MED FILL — ROSUVASTATIN CALCIUM 40 MG: 40 | 30 days supply | Qty: 30 | Fill #0

## 2019-11-25 NOTE — Progress Notes (Signed)
Virtual Visit via Telephone Note Patient arrived late for in person visit but subsequently left within about 10 to 15 minutes because he had a family emergency and requested the appointment to be virtual I connected with Jason Stevenson on 11/25/19 at 9:10 a.m by telephone and verified that I am speaking with the correct person using two identifiers. I am in my office.  The patient is in his car.  Only the patient and myself participated in this encounter.    I discussed the limitations, risks, security and privacy concerns of performing an evaluation and management service by telephone and the availability of in person appointments. I also discussed with the patient that there may be a patient responsible charge related to this service. The patient expressed understanding and agreed to proceed.   History of Present Illness: Patient with history of DM, HTN, HL,NICMwith systolic CHF [id number]%(MHDQ 10/2018, Cardiologist Dr. Johnsie Cancel), chronic LBP,medication noncompliance.  Last eval 07/2019   DIABETES TYPE 2 Last A1C:   Lab Results  Component Value Date   HGBA1C 14.2 (A) 04/26/2019   Med Adherence:  '[]'  Yes    '[x]'  No  - Just taking Humalog. Ran out of Levemir and could no longer afford even while he was on his wife's insurance.  Currently has no insurance.  Admits that he has not been taking oral meds every day but more often than before Medication side effects:  '[]'  Yes    '[x]'  No Home Monitoring?  '[x]'  Yes    '[]'  No Home glucose results range: "they been high" Diet Adherence: "I feel I'm doing good but can be better."  Drinks more diet drinks. Exercise: not exercising much.  Hypoglycemic episodes?: '[]'  Yes    '[x]'  No Numbness of the feet? Fingertips on LT hand are sensitive to touch and little numb x 2-3 wks.  Numbness in toes better.  Retinopathy hx? '[]'  Yes    '[]'  No Last eye exam: Overdue for eye exam but cannot afford. Comments:   HYPERTENSION Currently taking: see medication list Med  Adherence: Out of Entresto due to cost. Admits that he takes Coreg only once a day instead of twice a day as prescribed. Medication side effects: '[]'  Yes    '[x]'  No Adherence with salt restriction: '[x]'  Yes    '[]'  No Home Monitoring?: '[]'  Yes    '[x]'  No but does have a device Monitoring Frequency: '[]'  Yes    '[]'  No Home BP results range: '[]'  Yes    '[]'  No SOB? '[]'  Yes    '[x]'  No Chest Pain?:  occasionally Leg swelling?: '[]'  Yes    '[x]'  No Headaches?: '[]'  Yes    '[x]'  No Dizziness? '[]'  Yes    '[x]'  No Comments:    Outpatient Encounter Medications as of 11/25/2019  Medication Sig  . Blood Glucose Monitoring Suppl (TRUE METRIX METER) w/Device KIT 1 each by Does not apply route 3 (three) times daily.  . Blood Pressure Monitor DEVI Use as directed to check home blood pressure 2-3 times a week  . carvedilol (COREG) 6.25 MG tablet Take 1.5 tablets (9.375 mg total) by mouth 2 (two) times daily with a meal.  . glipiZIDE (GLUCOTROL) 10 MG tablet Take 1 tablet (10 mg total) by mouth 2 (two) times daily before a meal. To lower blood sugar  . glucose blood test strip Use as instructed  . insulin detemir (LEVEMIR) 100 UNIT/ML injection Inject 30 Units into the skin daily.  . insulin lispro (HUMALOG) 100 UNIT/ML injection Inject  into the skin 3 (three) times daily before meals. Patient using sliding scale insulin  . Insulin Pen Needle (PEN NEEDLES) 31G X 8 MM MISC UAD  . Insulin Syringe-Needle U-100 (RELION INSULIN SYRINGE) 31G X 15/64" 0.3 ML MISC Use to inject insulin daily. (Patient not taking: Reported on 01/18/2019)  . metFORMIN (GLUCOPHAGE) 500 MG tablet Take 1 tablet (500 mg total) by mouth 2 (two) times daily with a meal. TAKE 1 TABLET BY MOUTH 2 TIMES DAILY WITH A MEAL  . nitroGLYCERIN (NITROSTAT) 0.4 MG SL tablet Place 1 tablet (0.4 mg total) under the tongue every 5 (five) minutes as needed for chest pain. (Patient not taking: Reported on 01/18/2019)  . Omega-3 Fatty Acids (FISH OIL) 1000 MG CPDR Take 2,000 mg by  mouth daily. (Patient not taking: Reported on 01/18/2019)  . rosuvastatin (CRESTOR) 40 MG tablet Take 1 tablet (40 mg total) by mouth daily.  . sacubitril-valsartan (ENTRESTO) 49-51 MG Take 1 tablet by mouth 2 (two) times daily.   No facility-administered encounter medications on file as of 11/25/2019.      Observations/Objective: Lab Results  Component Value Date   CHOL 201 (H) 05/12/2019   HDL 55 05/12/2019   LDLCALC 127 (H) 05/12/2019   TRIG 104 05/12/2019   CHOLHDL 3.7 05/12/2019     Chemistry      Component Value Date/Time   NA 139 08/04/2019 0952   K 4.2 08/04/2019 0952   CL 98 08/04/2019 0952   CO2 24 08/04/2019 0952   BUN 11 08/04/2019 0952   CREATININE 0.74 (L) 08/04/2019 0952   CREATININE 0.80 04/17/2016 1204      Component Value Date/Time   CALCIUM 9.6 08/04/2019 0952   ALKPHOS 60 10/19/2018 0818   AST 17 10/19/2018 0818   ALT 13 10/19/2018 0818   BILITOT 0.4 10/19/2018 0818      Assessment and Plan: 1. Type 2 diabetes mellitus with diabetic polyneuropathy, with long-term current use of insulin ([place]) I will have our clinical pharmacist touch base with him to see whether he qualifies to get some if not all of his medications at a reduced price through our pharmacy.  Strongly encourage compliance with all of his medications. -He is agreeable to trying a low-dose of gabapentin for his neuropathy symptoms.  We will check B12 level. - insulin detemir (LEVEMIR) 100 UNIT/ML injection; Inject 0.3 mLs (30 Units total) into the skin daily.  Dispense: 10 mL; Refill: 5 - insulin lispro (HUMALOG) 100 UNIT/ML injection; inj 10-20 units three time a day with meals  Dispense: 10 mL; Refill: 6 - metFORMIN (GLUCOPHAGE) 500 MG tablet; Take 1 tablet (500 mg total) by mouth 2 (two) times daily with a meal. TAKE 1 TABLET BY MOUTH 2 TIMES DAILY WITH A MEAL  Dispense: 60 tablet; Refill: 6 - glipiZIDE (GLUCOTROL) 10 MG tablet; Take 1 tablet (10 mg total) by mouth 2 (two) times daily  before a meal. To lower blood sugar  Dispense: 60 tablet; Refill: 4 - Hemoglobin A1c; Future - Hepatic Function Panel; Future - Vitamin B12; Future - gabapentin (NEURONTIN) 300 MG capsule; Take 1 capsule (300 mg total) by mouth at bedtime.  Dispense: 30 capsule; Refill: 3  2. Essential hypertension Encourage compliance with carvedilol - carvedilol (COREG) 6.25 MG tablet; Take 1.5 tablets (9.375 mg total) by mouth 2 (two) times daily with a meal.  Dispense: 90 tablet; Refill: 6  3. Chronic systolic congestive heart failure ([place]) We will check with clinical pharmacist to see whether he  can get Entresto through our pharmacy - carvedilol (COREG) 6.25 MG tablet; Take 1.5 tablets (9.375 mg total) by mouth 2 (two) times daily with a meal.  Dispense: 90 tablet; Refill: 6 - sacubitril-valsartan (ENTRESTO) 49-51 MG; Take 1 tablet by mouth 2 (two) times daily.  Dispense: 60 tablet; Refill: 2  4. Non-ischemic cardiomyopathy ([facility]) - sacubitril-valsartan (ENTRESTO) 49-51 MG; Take 1 tablet by mouth 2 (two) times daily.  Dispense: 60 tablet; Refill: 2  5. Hyperlipidemia associated with type 2 diabetes mellitus ([facility]) - rosuvastatin (CRESTOR) 40 MG tablet; Take 1 tablet (40 mg total) by mouth daily.  Dispense: 90 tablet; Refill: 3   Follow Up Instructions: 4 mths  I discussed the assessment and treatment plan with the patient. The patient was provided an opportunity to ask questions and all were answered. The patient agreed with the plan and demonstrated an understanding of the instructions.   The patient was advised to call back or seek an in-person evaluation if the symptoms worsen or if the condition fails to improve as anticipated.  I provided  15 minutes of non-face-to-face time during this encounter.   Karle Plumber, MD

## 2019-11-26 ENCOUNTER — Ambulatory Visit: Payer: Medicaid Other | Attending: Internal Medicine

## 2019-11-26 DIAGNOSIS — Z794 Long term (current) use of insulin: Secondary | ICD-10-CM

## 2019-11-26 DIAGNOSIS — E1142 Type 2 diabetes mellitus with diabetic polyneuropathy: Secondary | ICD-10-CM

## 2019-12-01 ENCOUNTER — Telehealth: Payer: Self-pay | Admitting: Pharmacist

## 2019-12-01 LAB — HEPATIC FUNCTION PANEL
ALT: 13 IU/L (ref 0–44)
AST: 15 IU/L (ref 0–40)
Albumin: 4.6 g/dL (ref 4.0–5.0)
Alkaline Phosphatase: 71 IU/L (ref 48–121)
Bilirubin Total: 0.4 mg/dL (ref 0.0–1.2)
Bilirubin, Direct: 0.13 mg/dL (ref 0.00–0.40)
Total Protein: 7.2 g/dL (ref 6.0–8.5)

## 2019-12-01 LAB — HEMOGLOBIN A1C
Est. average glucose Bld gHb Est-mCnc: >398 mg/dL
Hgb A1c MFr Bld: >15.5 % — ABNORMAL HIGH (ref 4.8–5.6)

## 2019-12-01 LAB — VITAMIN B12: Vitamin B-12: 1427 pg/mL — ABNORMAL HIGH (ref 232–1245)

## 2019-12-01 NOTE — Telephone Encounter (Signed)
Called patient. He is using our pharmacy and has picked up his medications. He picked up all medications, including DM meds, on 11/24/2019.

## 2019-12-02 ENCOUNTER — Emergency Department (HOSPITAL_COMMUNITY)
Admission: EM | Admit: 2019-12-02 | Discharge: 2019-12-02 | Disposition: A | Discharge status: Home / Self Care | Payer: Medicaid Other | Attending: Emergency Medicine | Admitting: Emergency Medicine

## 2019-12-02 ENCOUNTER — Encounter (HOSPITAL_COMMUNITY): Payer: Self-pay

## 2019-12-02 ENCOUNTER — Other Ambulatory Visit: Payer: Self-pay

## 2019-12-02 DIAGNOSIS — R0781 Pleurodynia: Secondary | ICD-10-CM | POA: Insufficient documentation

## 2019-12-02 DIAGNOSIS — Z5321 Procedure and treatment not carried out due to patient leaving prior to being seen by health care provider: Secondary | ICD-10-CM | POA: Insufficient documentation

## 2019-12-02 LAB — URINALYSIS, ROUTINE W REFLEX MICROSCOPIC
Bacteria, UA: NONE SEEN
Bilirubin Urine: NEGATIVE
Glucose, UA: >=500 mg/dL — AB
Hgb urine dipstick: NEGATIVE
Ketones, ur: NEGATIVE mg/dL
Leukocytes,Ua: NEGATIVE
Nitrite: NEGATIVE
Protein, ur: NEGATIVE mg/dL
Specific Gravity, Urine: 1.028 (ref 1.005–1.030)
pH: 5 (ref 5.0–8.0)

## 2019-12-02 NOTE — ED Notes (Signed)
Pt has been called  Multiple times on 2  separate occasions . Pt did not answer either time. Pt did not tell any staff he was leaving.

## 2019-12-02 NOTE — ED Triage Notes (Signed)
Patient complains of bilateral side/rib pain x 2 days, states pain with movement. Patient alert and oriented, no vomiting.

## 2019-12-02 NOTE — ED Notes (Signed)
Pt stated he didn't want to stay so was witnessed leaving the department.

## 2019-12-05 ENCOUNTER — Encounter: Payer: Self-pay | Admitting: Internal Medicine

## 2020-02-16 ENCOUNTER — Encounter: Payer: Self-pay | Admitting: Internal Medicine

## 2020-02-16 ENCOUNTER — Telehealth: Payer: Self-pay | Admitting: Internal Medicine

## 2020-02-16 NOTE — Telephone Encounter (Signed)
Called patient and left a voicemail informing the patient he needs an appointment. Let him know the appointment could be virtual. Told him to call back and schedule.

## 2020-02-17 NOTE — Telephone Encounter (Signed)
Pt called stating that he cannot wait until providers next appt. He states that he cannot see another provider. Please advise.

## 2020-02-22 NOTE — Telephone Encounter (Signed)
Called patient and left voicemail stating that he could be seen before his appointment in December with Dr. Cato Mulligan. Advised patient to call back and schedule.

## 2020-02-22 NOTE — Telephone Encounter (Signed)
Could you contact pt and schedule him an appt with Dr. Cato Mulligan for Nov 18

## 2020-03-07 ENCOUNTER — Ambulatory Visit: Payer: Medicaid Other | Admitting: Internal Medicine

## 2020-03-27 ENCOUNTER — Ambulatory Visit: Payer: Self-pay | Admitting: Internal Medicine

## 2020-08-10 ENCOUNTER — Encounter: Payer: Self-pay | Admitting: Internal Medicine

## 2020-08-14 ENCOUNTER — Emergency Department (HOSPITAL_COMMUNITY)
Admission: EM | Admit: 2020-08-14 | Discharge: 2020-08-14 | Disposition: A | Discharge status: Home / Self Care | Payer: Medicaid Other | Attending: Emergency Medicine | Admitting: Emergency Medicine

## 2020-08-14 ENCOUNTER — Emergency Department (HOSPITAL_COMMUNITY): Payer: Medicaid Other

## 2020-08-14 ENCOUNTER — Other Ambulatory Visit: Payer: Self-pay

## 2020-08-14 DIAGNOSIS — Z794 Long term (current) use of insulin: Secondary | ICD-10-CM | POA: Insufficient documentation

## 2020-08-14 DIAGNOSIS — Z7984 Long term (current) use of oral hypoglycemic drugs: Secondary | ICD-10-CM | POA: Insufficient documentation

## 2020-08-14 DIAGNOSIS — Z79899 Other long term (current) drug therapy: Secondary | ICD-10-CM | POA: Insufficient documentation

## 2020-08-14 DIAGNOSIS — U071 COVID-19: Secondary | ICD-10-CM | POA: Diagnosis not present

## 2020-08-14 DIAGNOSIS — I110 Hypertensive heart disease with heart failure: Secondary | ICD-10-CM | POA: Insufficient documentation

## 2020-08-14 DIAGNOSIS — R519 Headache, unspecified: Secondary | ICD-10-CM | POA: Diagnosis present

## 2020-08-14 DIAGNOSIS — E119 Type 2 diabetes mellitus without complications: Secondary | ICD-10-CM | POA: Diagnosis not present

## 2020-08-14 DIAGNOSIS — I509 Heart failure, unspecified: Secondary | ICD-10-CM | POA: Diagnosis not present

## 2020-08-14 MED ORDER — ALBUTEROL SULFATE HFA 108 (90 BASE) MCG/ACT IN AERS: 2.0000 | INHALATION_SPRAY | RESPIRATORY_TRACT | Status: DC | PRN

## 2020-08-14 NOTE — ED Notes (Signed)
Pt refused to sit on the bed or be put on a monitor

## 2020-08-14 NOTE — ED Provider Notes (Signed)
[facility] EMERGENCY DEPARTMENT Provider Note   CSN: [id number] Arrival date & time: 08/14/20  1132     History Chief Complaint  Patient presents with  . Covid Positive    Jason Stevenson is a 35 y.o. male hx of DM, HTN, here presenting with positive COVID.  Patient states that he is fully vaccinated against COVID but did not receive his booster shot.  He states that he did go to church for Easter 4/17.  He had symptoms on the following day.  He states that he has some chills and fever and headaches.  His wife also started having symptoms.  She got tested at urgent care on last wed 4/27.  He then tested himself the following day and was positive.  Patient has not subjective shortness of breath and has no longer had fevers.  Patient states that he works from home.  The history is provided by the patient.       Past Medical History:  Diagnosis Date  . Chronic systolic CHF (congestive heart failure) ([place]) 06/18/2015  . Diabetes mellitus without complication ([place])   . Hyperlipidemia   . Hypertension   . Nonischemic cardiomyopathy ([place]) Noted as early as 2007   Per chart review (cards consult note 2011), EF of 40% in 2007, down to 20-25% in 2011    Patient Active Problem List   Diagnosis Date Noted  . Noncompliance with medication treatment due to intermittent use of medication 10/12/2018  . Dyslipidemia 04/17/2016  . Uncontrolled type 2 diabetes mellitus without complication, with long-term current use of insulin 08/07/2015  . Chronic systolic CHF (congestive heart failure) ([place]) 06/18/2015  . Needs flu shot 12/23/2013  . Type 2 diabetes mellitus ([place]) 11/18/2013  . Diabetes ([place]) 06/17/2013  . Non-ischemic cardiomyopathy ([place]) 11/06/2012  . HTN (hypertension) 11/06/2012  . HLD (hyperlipidemia) 11/06/2012    Past Surgical History:  Procedure Laterality Date  . None         Family History  Problem Relation Age of Onset  . Diabetes Mother   .  Hypertension Mother   . Stroke Maternal Aunt   . Heart attack Neg Hx     Social History   Tobacco Use  . Smoking status: Never Smoker  . Smokeless tobacco: Never Used  Substance Use Topics  . Alcohol use: Yes    Comment: "occasional" when "hanging out with the wrong people" No recent use.  . Drug use: No    Types: Marijuana    Comment: Last in 2013    Home Medications Prior to Admission medications   Medication Sig Start Date End Date Taking? Authorizing Provider  Blood Glucose Monitoring Suppl (TRUE METRIX METER) w/Device KIT 1 each by Does not apply route 3 (three) times daily. 07/02/18   Fulp, Ander Gaster, MD  Blood Pressure Monitor DEVI Use as directed to check home blood pressure 2-3 times a week 10/12/18   Ladell Pier, MD  carvedilol (COREG) 6.25 MG tablet Take 1.5 tablets (9.375 mg total) by mouth 2 (two) times daily with a meal. 11/25/19   Ladell Pier, MD  gabapentin (NEURONTIN) 300 MG capsule Take 1 capsule (300 mg total) by mouth at bedtime. 11/25/19   Ladell Pier, MD  glipiZIDE (GLUCOTROL) 10 MG tablet Take 1 tablet (10 mg total) by mouth 2 (two) times daily before a meal. To lower blood sugar 11/25/19   Ladell Pier, MD  glucose blood test strip Use as instructed 07/02/18   Fulp, Cammie,  MD  insulin detemir (LEVEMIR) 100 UNIT/ML injection Inject 0.3 mLs (30 Units total) into the skin daily. 11/25/19   Ladell Pier, MD  insulin lispro (HUMALOG) 100 UNIT/ML injection inj 10-20 units three time a day with meals 11/25/19   Ladell Pier, MD  Insulin Pen Needle (PEN NEEDLES) 31G X 8 MM MISC UAD 04/26/19   Ladell Pier, MD  Insulin Syringe-Needle U-100 (RELION INSULIN SYRINGE) 31G X 15/64" 0.3 ML MISC Use to inject insulin daily. 07/13/18   Ladell Pier, MD  metFORMIN (GLUCOPHAGE) 500 MG tablet Take 1 tablet (500 mg total) by mouth 2 (two) times daily with a meal. TAKE 1 TABLET BY MOUTH 2 TIMES DAILY WITH A MEAL 11/25/19   Ladell Pier, MD   nitroGLYCERIN (NITROSTAT) 0.4 MG SL tablet Place 1 tablet (0.4 mg total) under the tongue every 5 (five) minutes as needed for chest pain. 02/20/15   Josue Hector, MD  Omega-3 Fatty Acids (FISH OIL) 1000 MG CPDR Take 2,000 mg by mouth daily. Patient not taking: Reported on 01/18/2019 07/08/18   Ladell Pier, MD  rosuvastatin (CRESTOR) 40 MG tablet Take 1 tablet (40 mg total) by mouth daily. 11/25/19   Ladell Pier, MD  sacubitril-valsartan (ENTRESTO) 49-51 MG Take 1 tablet by mouth 2 (two) times daily. 11/25/19   Ladell Pier, MD    Allergies    Patient has no known allergies.  Review of Systems   Review of Systems  Respiratory: Positive for cough and shortness of breath.   All other systems reviewed and are negative.   Physical Exam Updated Vital Signs BP 114/88 (BP Location: Left Arm)   Pulse 62   Temp 98 F (36.7 C) (Oral)   Resp 20   SpO2 100%   Physical Exam Vitals and nursing note reviewed.  Constitutional:      Comments: Well-appearing and well hydrated   HENT:     Head: Normocephalic.     Nose: Nose normal.     Mouth/Throat:     Mouth: Mucous membranes are moist.  Eyes:     Extraocular Movements: Extraocular movements intact.     Pupils: Pupils are equal, round, and reactive to light.  Cardiovascular:     Rate and Rhythm: Normal rate and regular rhythm.     Pulses: Normal pulses.     Heart sounds: Normal heart sounds.  Pulmonary:     Effort: Pulmonary effort is normal.     Breath sounds: Normal breath sounds.     Comments: No wheezing  Abdominal:     General: Abdomen is flat.     Palpations: Abdomen is soft.  Musculoskeletal:        General: Normal range of motion.     Cervical back: Normal range of motion and neck supple.  Skin:    General: Skin is warm.     Capillary Refill: Capillary refill takes less than 2 seconds.  Neurological:     General: No focal deficit present.  Psychiatric:        Mood and Affect: Mood normal.     ED  Results / Procedures / Treatments   Labs (all labs ordered are listed, but only abnormal results are displayed) Labs Reviewed - No data to display  EKG None  Radiology DG Chest 2 View  Result Date: 08/14/2020 CLINICAL DATA:  Shortness of breath.  Covid-19 viral infection. EXAM: CHEST - 2 VIEW COMPARISON:  01/08/2019 FINDINGS: The heart size and mediastinal  contours are within normal limits. Both lungs are clear. The visualized skeletal structures are unremarkable. IMPRESSION: Normal exam. Electronically Signed   By: Marlaine Hind M.D.   On: 08/14/2020 12:26    Procedures Procedures   Medications Ordered in ED Medications  albuterol (VENTOLIN HFA) 108 (90 Base) MCG/ACT inhaler 2 puff (has no administration in time range)    ED Course  I have reviewed the triage vital signs and the nursing notes.  Pertinent labs & imaging results that were available during my care of the patient were reviewed by me and considered in my medical decision making (see chart for details).    MDM Rules/Calculators/A&P                         Jason Stevenson is a 35 y.o. male here presenting with cough and congestion.  Patient has a home positive test for COVID.  Patient's wife also tested positive on 4/25.  He is fully vaccinated and symptoms are very mild.  His symptoms technically started on 4/18 so he is outside of the 10-day isolation.  But since his wife just tested positive on 4/27, will calculate the isolation period from 4/27, which will end on 5/8. Reassured patient, will have him follow up at [facility], doesn't qualify for Paxlovid right now.   Jason Stevenson was evaluated in Emergency Department on 08/14/2020 for the symptoms described in the history of present illness. He was evaluated in the context of the global COVID-19 pandemic, which necessitated consideration that the patient might be at risk for infection with the SARS-CoV-2 virus that causes COVID-19. Institutional protocols and  algorithms that pertain to the evaluation of patients at risk for COVID-19 are in a state of rapid change based on information released by regulatory bodies including the CDC and federal and state organizations. These policies and algorithms were followed during the patient's care in the ED.    Final Clinical Impression(s) / ED Diagnoses Final diagnoses:  None    Rx / DC Orders ED Discharge Orders    None       Drenda Freeze, MD 08/14/20 [phone]

## 2020-08-14 NOTE — Discharge Instructions (Addendum)
Please follow-up with COVID clinic.  Take Tylenol or Motrin for headaches or fevers  Use albuterol every 4 hours as needed for cough  You are expected to have some cough and fever and shortness of breath and headaches for several days  You should stay home until 5/8 with your wife.  Please notify your close contacts  See your doctor for follow-up  Return to ER if you have worse shortness of breath, cough, fever, trouble breathing     Person Under Monitoring Name: Jason Stevenson  Location: [address] [place] [address]   Infection Prevention Recommendations for Individuals Confirmed to have, or Being Evaluated for, 2019 Novel Coronavirus (COVID-19) Infection Who Receive Care at Home  Individuals who are confirmed to have, or are being evaluated for, COVID-19 should follow the prevention steps below until a healthcare provider or local or state health department says they can return to normal activities.  Stay home except to get medical care You should restrict activities outside your home, except for getting medical care. Do not go to work, school, or public areas, and do not use public transportation or taxis.  Call ahead before visiting your doctor Before your medical appointment, call the healthcare provider and tell them that you have, or are being evaluated for, COVID-19 infection. This will help the healthcare provider's office take steps to keep other people from getting infected. Ask your healthcare provider to call the local or state health department.  Monitor your symptoms Seek prompt medical attention if your illness is worsening (e.g., difficulty breathing). Before going to your medical appointment, call the healthcare provider and tell them that you have, or are being evaluated for, COVID-19 infection. Ask your healthcare provider to call the local or state health department.  Wear a facemask You should wear a facemask that covers your nose and  mouth when you are in the same room with other people and when you visit a healthcare provider. People who live with or visit you should also wear a facemask while they are in the same room with you.  Separate yourself from other people in your home As much as possible, you should stay in a different room from other people in your home. Also, you should use a separate bathroom, if available.  Avoid sharing household items You should not share dishes, drinking glasses, cups, eating utensils, towels, bedding, or other items with other people in your home. After using these items, you should wash them thoroughly with soap and water.  Cover your coughs and sneezes Cover your mouth and nose with a tissue when you cough or sneeze, or you can cough or sneeze into your sleeve. Throw used tissues in a lined trash can, and immediately wash your hands with soap and water for at least 20 seconds or use an alcohol-based hand rub.  Wash your [employer] your hands often and thoroughly with soap and water for at least 20 seconds. You can use an alcohol-based hand sanitizer if soap and water are not available and if your hands are not visibly dirty. Avoid touching your eyes, nose, and mouth with unwashed hands.   Prevention Steps for Caregivers and Household Members of Individuals Confirmed to have, or Being Evaluated for, COVID-19 Infection Being Cared for in the Home  If you live with, or provide care at home for, a person confirmed to have, or being evaluated for, COVID-19 infection please follow these guidelines to prevent infection:  Follow healthcare provider's instructions Make sure that  you understand and can help the patient follow any healthcare provider instructions for all care.  Provide for the patient's basic needs You should help the patient with basic needs in the home and provide support for getting groceries, prescriptions, and other personal needs.  Monitor the patient's  symptoms If they are getting sicker, call his or her medical provider and tell them that the patient has, or is being evaluated for, COVID-19 infection. This will help the healthcare provider's office take steps to keep other people from getting infected. Ask the healthcare provider to call the local or state health department.  Limit the number of people who have contact with the patient If possible, have only one caregiver for the patient. Other household members should stay in another home or place of residence. If this is not possible, they should stay in another room, or be separated from the patient as much as possible. Use a separate bathroom, if available. Restrict visitors who do not have an essential need to be in the home.  Keep older adults, very young children, and other sick people away from the patient Keep older adults, very young children, and those who have compromised immune systems or chronic health conditions away from the patient. This includes people with chronic heart, lung, or kidney conditions, diabetes, and cancer.  Ensure good ventilation Make sure that shared spaces in the home have good air flow, such as from an air conditioner or an opened window, weather permitting.  Wash your hands often Wash your hands often and thoroughly with soap and water for at least 20 seconds. You can use an alcohol based hand sanitizer if soap and water are not available and if your hands are not visibly dirty. Avoid touching your eyes, nose, and mouth with unwashed hands. Use disposable paper towels to dry your hands. If not available, use dedicated cloth towels and replace them when they become wet.  Wear a facemask and gloves Wear a disposable facemask at all times in the room and gloves when you touch or have contact with the patient's blood, body fluids, and/or secretions or excretions, such as sweat, saliva, sputum, nasal mucus, vomit, urine, or feces.  Ensure the mask fits over  your nose and mouth tightly, and do not touch it during use. Throw out disposable facemasks and gloves after using them. Do not reuse. Wash your hands immediately after removing your facemask and gloves. If your personal clothing becomes contaminated, carefully remove clothing and launder. Wash your hands after handling contaminated clothing. Place all used disposable facemasks, gloves, and other waste in a lined container before disposing them with other household waste. Remove gloves and wash your hands immediately after handling these items.  Do not share dishes, glasses, or other household items with the patient Avoid sharing household items. You should not share dishes, drinking glasses, cups, eating utensils, towels, bedding, or other items with a patient who is confirmed to have, or being evaluated for, COVID-19 infection. After the person uses these items, you should wash them thoroughly with soap and water.  Wash laundry thoroughly Immediately remove and wash clothes or bedding that have blood, body fluids, and/or secretions or excretions, such as sweat, saliva, sputum, nasal mucus, vomit, urine, or feces, on them. Wear gloves when handling laundry from the patient. Read and follow directions on labels of laundry or clothing items and detergent. In general, wash and dry with the warmest temperatures recommended on the label.  Clean all areas the individual has used  often Clean all touchable surfaces, such as counters, tabletops, doorknobs, bathroom fixtures, toilets, phones, keyboards, tablets, and bedside tables, every day. Also, clean any surfaces that may have blood, body fluids, and/or secretions or excretions on them. Wear gloves when cleaning surfaces the patient has come in contact with. Use a diluted bleach solution (e.g., dilute bleach with 1 part bleach and 10 parts water) or a household disinfectant with a label that says EPA-registered for coronaviruses. To make a bleach  solution at home, add 1 tablespoon of bleach to 1 quart (4 cups) of water. For a larger supply, add  cup of bleach to 1 gallon (16 cups) of water. Read labels of cleaning products and follow recommendations provided on product labels. Labels contain instructions for safe and effective use of the cleaning product including precautions you should take when applying the product, such as wearing gloves or eye protection and making sure you have good ventilation during use of the product. Remove gloves and wash hands immediately after cleaning.  Monitor yourself for signs and symptoms of illness Caregivers and household members are considered close contacts, should monitor their health, and will be asked to limit movement outside of the home to the extent possible. Follow the monitoring steps for close contacts listed on the symptom monitoring form.   ? If you have additional questions, contact your local health department or call the epidemiologist on call at [phone] (available 24/7). ? This guidance is subject to change. For the most up-to-date guidance from [facility], please refer to their website: [email or web address]

## 2020-08-14 NOTE — ED Notes (Signed)
Pt heart rate went up to 125, an order for an EKG was put in, pt left before it was done. EDP Jason Stevenson notified.

## 2020-08-14 NOTE — ED Triage Notes (Signed)
Pt here with reports of headache, fatigue and shob X1 week. Pt reports taking a rapid covid test at home with positive result.

## 2020-08-14 NOTE — ED Notes (Signed)
After updating patient's vital signs, this RN alerted ED MD that patient had increased HR. ED MD ordered ECG. When ED tech went into room to perform ECG, patient was not located in room. Both bathrooms in area were checked, patient not located. ED MD made aware.

## 2020-08-17 ENCOUNTER — Telehealth: Payer: Self-pay | Admitting: Nurse Practitioner

## 2020-08-17 NOTE — Telephone Encounter (Signed)
Pt was called VM was left for Pt to call the office to schedule a appointment to be treated for Covid symptoms

## 2020-08-21 ENCOUNTER — Telehealth: Payer: Self-pay | Admitting: Nurse Practitioner

## 2020-08-21 NOTE — Telephone Encounter (Signed)
Pt was called Vm was left for Pt to call ans schedule appointment to be treated for Covid

## 2020-08-22 ENCOUNTER — Ambulatory Visit: Payer: Medicaid Other

## 2020-08-22 ENCOUNTER — Ambulatory Visit (INDEPENDENT_AMBULATORY_CARE_PROVIDER_SITE_OTHER): Payer: Medicaid Other | Admitting: Nurse Practitioner

## 2020-08-22 VITALS — BP 110/73 | HR 111 | Temp 97.9°F | Resp 18

## 2020-08-22 DIAGNOSIS — U071 COVID-19: Secondary | ICD-10-CM

## 2020-08-22 NOTE — Progress Notes (Signed)
_0  ID: Jason Stevenson, male    DOB: 09-Jul-1985, 35 y.o.   MRN: [id number]  Chief Complaint  Patient presents with  . history of covid    Referring provider: Ladell Pier, MD   HPI  Patient presents today for post-COVID care clinic visit.  Patient was diagnosed with COVID on 08/09/2020.  He was seen in the ED on 08/14/2020.  Patient is fully vaccinated.  He states that his symptoms started on 07/31/2020.  Patient did have a chest x-ray in the ED that was clear.  Patient states that he continues to have altered taste and brain fog.  He also complains of ongoing headaches.  He denies any shortness of breath or cough.  Vital signs are stable today. Denies f/c/s, n/v/d, hemoptysis, PND,  chest pain or edema.      No Known Allergies  Immunization History  Administered Date(s) Administered  . Influenza,inj,Quad PF,6+ Mos 12/23/2013, 04/17/2016, 01/18/2019  . Pneumococcal Conjugate-13 12/23/2013  . Pneumococcal Polysaccharide-23 08/12/2017  . Tdap 08/12/2017    Past Medical History:  Diagnosis Date  . Chronic systolic CHF (congestive heart failure) ([place]) 06/18/2015  . Diabetes mellitus without complication ([place])   . Hyperlipidemia   . Hypertension   . Nonischemic cardiomyopathy ([place]) Noted as early as 2007   Per chart review (cards consult note 2011), EF of 40% in 2007, down to 20-25% in 2011    Tobacco History: Social History   Tobacco Use  Smoking Status Never Smoker  Smokeless Tobacco Never Used   Counseling given: Yes   Outpatient Encounter Medications as of 08/22/2020  Medication Sig  . Blood Glucose Monitoring Suppl (TRUE METRIX METER) w/Device KIT 1 each by Does not apply route 3 (three) times daily.  . Blood Pressure Monitor DEVI Use as directed to check home blood pressure 2-3 times a week  . carvedilol (COREG) 6.25 MG tablet Take 1.5 tablets (9.375 mg total) by mouth 2 (two) times daily with a meal.  . gabapentin (NEURONTIN) 300 MG capsule Take 1 capsule  (300 mg total) by mouth at bedtime.  Marland Kitchen glipiZIDE (GLUCOTROL) 10 MG tablet Take 1 tablet (10 mg total) by mouth 2 (two) times daily before a meal. To lower blood sugar  . glucose blood test strip Use as instructed  . insulin detemir (LEVEMIR) 100 UNIT/ML injection Inject 0.3 mLs (30 Units total) into the skin daily.  . insulin lispro (HUMALOG) 100 UNIT/ML injection inj 10-20 units three time a day with meals  . Insulin Pen Needle (PEN NEEDLES) 31G X 8 MM MISC UAD  . Insulin Syringe-Needle U-100 (RELION INSULIN SYRINGE) 31G X 15/64" 0.3 ML MISC Use to inject insulin daily.  . metFORMIN (GLUCOPHAGE) 500 MG tablet Take 1 tablet (500 mg total) by mouth 2 (two) times daily with a meal. TAKE 1 TABLET BY MOUTH 2 TIMES DAILY WITH A MEAL  . nitroGLYCERIN (NITROSTAT) 0.4 MG SL tablet Place 1 tablet (0.4 mg total) under the tongue every 5 (five) minutes as needed for chest pain.  . Omega-3 Fatty Acids (FISH OIL) 1000 MG CPDR Take 2,000 mg by mouth daily. (Patient not taking: Reported on 01/18/2019)  . rosuvastatin (CRESTOR) 40 MG tablet Take 1 tablet (40 mg total) by mouth daily.  . sacubitril-valsartan (ENTRESTO) 49-51 MG Take 1 tablet by mouth 2 (two) times daily.   No facility-administered encounter medications on file as of 08/22/2020.     Review of Systems  Review of Systems  Constitutional: Negative.  Negative for fatigue and  fever.  HENT: Negative.   Respiratory: Negative for cough and shortness of breath.   Cardiovascular: Negative.   Gastrointestinal: Negative.   Allergic/Immunologic: Negative.   Neurological: Positive for headaches.  Psychiatric/Behavioral: Positive for decreased concentration.       Physical Exam  BP 110/73   Pulse (!) 111   Temp 97.9 F (36.6 C)   Resp 18   SpO2 100%   Wt Readings from Last 5 Encounters:  12/02/19 167 lb (75.8 kg)  08/06/19 167 lb (75.8 kg)  04/26/19 167 lb 9.6 oz (76 kg)  01/18/19 163 lb (73.9 kg)  12/04/18 167 lb (75.8 kg)      Physical Exam Vitals and nursing note reviewed.  Constitutional:      General: He is not in acute distress.    Appearance: He is well-developed.  Cardiovascular:     Rate and Rhythm: Normal rate and regular rhythm.  Pulmonary:     Effort: Pulmonary effort is normal.     Breath sounds: Normal breath sounds.  Skin:    General: Skin is warm and dry.  Neurological:     Mental Status: He is alert and oriented to person, place, and time.      Imaging: DG Chest 2 View  Result Date: 08/14/2020 CLINICAL DATA:  Shortness of breath.  Covid-19 viral infection. EXAM: CHEST - 2 VIEW COMPARISON:  01/08/2019 FINDINGS: The heart size and mediastinal contours are within normal limits. Both lungs are clear. The visualized skeletal structures are unremarkable. IMPRESSION: Normal exam. Electronically Signed   By: Marlaine Hind M.D.   On: 08/14/2020 12:26     Assessment & Plan:   COVID-19 Covid 19:   Stay well hydrated  Stay active  Deep breathing exercises  May start vitamin C daily, vitamin D3 daily, Zinc daily  May take tylenol or fever or pain  Olfactory retraining - for taste and smell  Magnesium - 600 mg daily - for headache     Follow up:  Follow up in 2 weeks or sooner if needed     Fenton Foy, NP 08/23/2020

## 2020-08-22 NOTE — Patient Instructions (Signed)
Covid 19:   Stay well hydrated  Stay active  Deep breathing exercises  May start vitamin C daily, vitamin D3 daily, Zinc daily  May take tylenol or fever or pain  Olfactory retraining - for taste and smell  Magnesium - 600 mg daily - for headache     Follow up:  Follow up in 2 weeks or sooner if needed

## 2020-08-23 DIAGNOSIS — U071 COVID-19: Secondary | ICD-10-CM | POA: Insufficient documentation

## 2020-08-23 NOTE — Assessment & Plan Note (Signed)
Covid 19:   Stay well hydrated  Stay active  Deep breathing exercises  May start vitamin C daily, vitamin D3 daily, Zinc daily  May take tylenol or fever or pain  Olfactory retraining - for taste and smell  Magnesium - 600 mg daily - for headache     Follow up:  Follow up in 2 weeks or sooner if needed  

## 2020-09-05 ENCOUNTER — Ambulatory Visit (INDEPENDENT_AMBULATORY_CARE_PROVIDER_SITE_OTHER): Payer: Medicaid Other | Admitting: Nurse Practitioner

## 2020-09-05 VITALS — BP 102/85 | HR 107 | Temp 97.9°F | Resp 18

## 2020-09-05 DIAGNOSIS — Z8616 Personal history of COVID-19: Secondary | ICD-10-CM

## 2020-09-05 NOTE — Progress Notes (Signed)
'@Patient'  ID: Jason Stevenson, male    DOB: 23-May-1985, 35 y.o.   MRN: [id number]  Chief Complaint  Patient presents with  . Follow-up    Referring provider: Ladell Pier, MD   HPI  Patient presents today for post-COVID care clinic visit.  Patient was diagnosed with COVID on 08/09/2020.  Patient is fully vaccinated.  Most recent chest x-ray was clear.  Patient does state that he has been having headaches since COVID.  We discussed that we can try magnesium 600 mg daily for headache.  He states that his headaches have improved since starting this.  We discussed trying olfactory retraining online for taste and smell. Denies f/c/s, n/v/d, hemoptysis, PND, chest pain or edema.       No Known Allergies  Immunization History  Administered Date(s) Administered  . Influenza,inj,Quad PF,6+ Mos 12/23/2013, 04/17/2016, 01/18/2019  . Pneumococcal Conjugate-13 12/23/2013  . Pneumococcal Polysaccharide-23 08/12/2017  . Tdap 08/12/2017    Past Medical History:  Diagnosis Date  . Chronic systolic CHF (congestive heart failure) ([place]) 06/18/2015  . Diabetes mellitus without complication ([place])   . Hyperlipidemia   . Hypertension   . Nonischemic cardiomyopathy ([place]) Noted as early as 2007   Per chart review (cards consult note 2011), EF of 40% in 2007, down to 20-25% in 2011    Tobacco History: Social History   Tobacco Use  Smoking Status Never Smoker  Smokeless Tobacco Never Used   Counseling given: Yes   No facility-administered encounter medications on file as of 09/05/2020.   Outpatient Encounter Medications as of 09/05/2020  Medication Sig  . Blood Glucose Monitoring Suppl (TRUE METRIX METER) w/Device KIT 1 each by Does not apply route 3 (three) times daily.  . Blood Pressure Monitor DEVI Use as directed to check home blood pressure 2-3 times a week  . carvedilol (COREG) 6.25 MG tablet Take 1.5 tablets (9.375 mg total) by mouth 2 (two) times daily with a meal.  . gabapentin  (NEURONTIN) 300 MG capsule Take 1 capsule (300 mg total) by mouth at bedtime. (Patient taking differently: Take 300 mg by mouth at bedtime as needed (pain).)  . glipiZIDE (GLUCOTROL) 10 MG tablet Take 1 tablet (10 mg total) by mouth 2 (two) times daily before a meal. To lower blood sugar  . glucose blood test strip Use as instructed  . insulin detemir (LEVEMIR) 100 UNIT/ML injection Inject 0.3 mLs (30 Units total) into the skin daily.  . insulin lispro (HUMALOG) 100 UNIT/ML injection inj 10-20 units three time a day with meals (Patient taking differently: Inject 10-20 Units into the skin See admin instructions. Per sliding scale with meals)  . Insulin Pen Needle (PEN NEEDLES) 31G X 8 MM MISC UAD  . Insulin Syringe-Needle U-100 (RELION INSULIN SYRINGE) 31G X 15/64" 0.3 ML MISC Use to inject insulin daily.  . metFORMIN (GLUCOPHAGE) 500 MG tablet Take 1 tablet (500 mg total) by mouth 2 (two) times daily with a meal. TAKE 1 TABLET BY MOUTH 2 TIMES DAILY WITH A MEAL (Patient taking differently: Take 500 mg by mouth 2 (two) times daily with a meal.)  . nitroGLYCERIN (NITROSTAT) 0.4 MG SL tablet Place 1 tablet (0.4 mg total) under the tongue every 5 (five) minutes as needed for chest pain.  . Omega-3 Fatty Acids (FISH OIL) 1000 MG CPDR Take 2,000 mg by mouth daily. (Patient not taking: No sig reported)  . rosuvastatin (CRESTOR) 40 MG tablet Take 1 tablet (40 mg total) by mouth daily.  . sacubitril-valsartan (  ENTRESTO) 49-51 MG Take 1 tablet by mouth 2 (two) times daily.     Review of Systems  Review of Systems  Constitutional: Negative.  Negative for fatigue and fever.  HENT: Negative.   Respiratory: Negative for cough and shortness of breath.   Cardiovascular: Negative.   Gastrointestinal: Negative.   Allergic/Immunologic: Negative.   Neurological: Positive for headaches.       Altered taste and smell  Psychiatric/Behavioral: Negative.        Physical Exam  BP 102/85   Pulse (!) 107    Temp 97.9 F (36.6 C)   Resp 18   SpO2 98%   Wt Readings from Last 5 Encounters:  12/02/19 167 lb (75.8 kg)  08/06/19 167 lb (75.8 kg)  04/26/19 167 lb 9.6 oz (76 kg)  01/18/19 163 lb (73.9 kg)  12/04/18 167 lb (75.8 kg)     Physical Exam Vitals and nursing note reviewed.  Constitutional:      General: He is not in acute distress.    Appearance: He is well-developed.  Cardiovascular:     Rate and Rhythm: Normal rate and regular rhythm.  Pulmonary:     Effort: Pulmonary effort is normal.     Breath sounds: Normal breath sounds.  Musculoskeletal:     Right lower leg: No edema.     Left lower leg: No edema.  Skin:    General: Skin is warm and dry.  Neurological:     Mental Status: He is alert and oriented to person, place, and time.  Psychiatric:        Mood and Affect: Mood normal.        Behavior: Behavior normal.         Assessment & Plan:   History of COVID-19 Stay well hydrated  Stay active  Deep breathing exercises  May start vitamin C daily, vitamin D3 daily, Zinc daily  May take tylenol or fever or pain  Olfactory retraining - for taste and smell  Magnesium - 600 mg daily - for headache     Follow up:  Follow up if needed  Will need follow up with PCP in 1 month       Fenton Foy, NP 09/12/2020

## 2020-09-05 NOTE — Patient Instructions (Addendum)
History of Covid 19:   Stay well hydrated  Stay active  Deep breathing exercises  May start vitamin C daily, vitamin D3 daily, Zinc daily  May take tylenol or fever or pain  Olfactory retraining - for taste and smell  Magnesium - 600 mg daily - for headache     Follow up:  Follow up if needed  Will need follow up with PCP in 1 month

## 2020-09-05 NOTE — Progress Notes (Deleted)
'@Patient'  ID: Jason Stevenson, male    DOB: [birth year], 35 y.o.   MRN: [id number]  No chief complaint on file.   Referring provider: Ladell Pier, MD  HPI       No Known Allergies  Immunization History  Administered Date(s) Administered  . Influenza,inj,Quad PF,6+ Mos 12/23/2013, 04/17/2016, 01/18/2019  . Pneumococcal Conjugate-13 12/23/2013  . Pneumococcal Polysaccharide-23 08/12/2017  . Tdap 08/12/2017    Past Medical History:  Diagnosis Date  . Chronic systolic CHF (congestive heart failure) ([place]) 06/18/2015  . Diabetes mellitus without complication ([place])   . Hyperlipidemia   . Hypertension   . Nonischemic cardiomyopathy ([place]) Noted as early as 2007   Per chart review (cards consult note 2011), EF of 40% in 2007, down to 20-25% in 2011    Tobacco History: Social History   Tobacco Use  Smoking Status Never Smoker  Smokeless Tobacco Never Used   Counseling given: Not Answered   Outpatient Encounter Medications as of 09/05/2020  Medication Sig  . Blood Glucose Monitoring Suppl (TRUE METRIX METER) w/Device KIT 1 each by Does not apply route 3 (three) times daily.  . Blood Pressure Monitor DEVI Use as directed to check home blood pressure 2-3 times a week  . carvedilol (COREG) 6.25 MG tablet Take 1.5 tablets (9.375 mg total) by mouth 2 (two) times daily with a meal.  . gabapentin (NEURONTIN) 300 MG capsule Take 1 capsule (300 mg total) by mouth at bedtime.  Marland Kitchen glipiZIDE (GLUCOTROL) 10 MG tablet Take 1 tablet (10 mg total) by mouth 2 (two) times daily before a meal. To lower blood sugar  . glucose blood test strip Use as instructed  . insulin detemir (LEVEMIR) 100 UNIT/ML injection Inject 0.3 mLs (30 Units total) into the skin daily.  . insulin lispro (HUMALOG) 100 UNIT/ML injection inj 10-20 units three time a day with meals  . Insulin Pen Needle (PEN NEEDLES) 31G X 8 MM MISC UAD  . Insulin Syringe-Needle U-100 (RELION INSULIN SYRINGE) 31G X 15/64" 0.3 ML  MISC Use to inject insulin daily.  . metFORMIN (GLUCOPHAGE) 500 MG tablet Take 1 tablet (500 mg total) by mouth 2 (two) times daily with a meal. TAKE 1 TABLET BY MOUTH 2 TIMES DAILY WITH A MEAL  . nitroGLYCERIN (NITROSTAT) 0.4 MG SL tablet Place 1 tablet (0.4 mg total) under the tongue every 5 (five) minutes as needed for chest pain.  . Omega-3 Fatty Acids (FISH OIL) 1000 MG CPDR Take 2,000 mg by mouth daily. (Patient not taking: Reported on 01/18/2019)  . rosuvastatin (CRESTOR) 40 MG tablet Take 1 tablet (40 mg total) by mouth daily.  . sacubitril-valsartan (ENTRESTO) 49-51 MG Take 1 tablet by mouth 2 (two) times daily.   No facility-administered encounter medications on file as of 09/05/2020.     Review of Systems  Review of Systems     Physical Exam  There were no vitals taken for this visit.  Wt Readings from Last 5 Encounters:  12/02/19 167 lb (75.8 kg)  08/06/19 167 lb (75.8 kg)  04/26/19 167 lb 9.6 oz (76 kg)  01/18/19 163 lb (73.9 kg)  12/04/18 167 lb (75.8 kg)     Physical Exam   Lab Results:  CBC    Component Value Date/Time   WBC 8.1 03/04/2017 2256   RBC 4.90 03/04/2017 2256   HGB 14.2 03/04/2017 2256   HCT 41.7 03/04/2017 2256   PLT 262 03/04/2017 2256   MCV 85.1 03/04/2017 2256   MCH  29.0 03/04/2017 2256   MCHC 34.1 03/04/2017 2256   RDW 12.0 03/04/2017 2256   LYMPHSABS 2.6 11/14/2009 2120   MONOABS 0.4 11/14/2009 2120   EOSABS 0.1 11/14/2009 2120   BASOSABS 0.0 11/14/2009 2120    BMET    Component Value Date/Time   NA 139 08/04/2019 0952   K 4.2 08/04/2019 0952   CL 98 08/04/2019 0952   CO2 24 08/04/2019 0952   GLUCOSE 303 (H) 08/04/2019 0952   GLUCOSE 81 03/04/2017 2256   BUN 11 08/04/2019 0952   CREATININE 0.74 (L) 08/04/2019 0952   CREATININE 0.80 04/17/2016 1204   CALCIUM 9.6 08/04/2019 0952   GFRNONAA 120 08/04/2019 0952   GFRNONAA >89 04/17/2016 1204   GFRAA 138 08/04/2019 0952   GFRAA >89 04/17/2016 1204    BNP    Component  Value Date/Time   BNP 62.8 05/25/2015 1005    ProBNP No results found for: PROBNP  Imaging: DG Chest 2 View  Result Date: 08/14/2020 CLINICAL DATA:  Shortness of breath.  Covid-19 viral infection. EXAM: CHEST - 2 VIEW COMPARISON:  01/08/2019 FINDINGS: The heart size and mediastinal contours are within normal limits. Both lungs are clear. The visualized skeletal structures are unremarkable. IMPRESSION: Normal exam. Electronically Signed   By: Marlaine Hind M.D.   On: 08/14/2020 12:26     Assessment & Plan:   No problem-specific Assessment & Plan notes found for this encounter.     Fenton Foy, NP 09/05/2020

## 2020-09-11 ENCOUNTER — Emergency Department (HOSPITAL_COMMUNITY): Payer: Medicaid Other

## 2020-09-11 ENCOUNTER — Observation Stay (HOSPITAL_COMMUNITY): Payer: Medicaid Other

## 2020-09-11 ENCOUNTER — Other Ambulatory Visit: Payer: Self-pay

## 2020-09-11 ENCOUNTER — Inpatient Hospital Stay (HOSPITAL_COMMUNITY)
Admission: EM | Admit: 2020-09-11 | Discharge: 2020-09-19 | DRG: 064 | Disposition: A | Discharge status: Home / Self Care | Payer: Medicaid Other | Attending: Family Medicine | Admitting: Family Medicine

## 2020-09-11 DIAGNOSIS — E1100 Type 2 diabetes mellitus with hyperosmolarity without nonketotic hyperglycemic-hyperosmolar coma (NKHHC): Secondary | ICD-10-CM | POA: Diagnosis present

## 2020-09-11 DIAGNOSIS — E1142 Type 2 diabetes mellitus with diabetic polyneuropathy: Secondary | ICD-10-CM

## 2020-09-11 DIAGNOSIS — Z9119 Patient's noncompliance with other medical treatment and regimen: Secondary | ICD-10-CM

## 2020-09-11 DIAGNOSIS — I10 Essential (primary) hypertension: Secondary | ICD-10-CM | POA: Diagnosis not present

## 2020-09-11 DIAGNOSIS — R29818 Other symptoms and signs involving the nervous system: Secondary | ICD-10-CM | POA: Diagnosis not present

## 2020-09-11 DIAGNOSIS — I428 Other cardiomyopathies: Secondary | ICD-10-CM | POA: Diagnosis present

## 2020-09-11 DIAGNOSIS — E1165 Type 2 diabetes mellitus with hyperglycemia: Secondary | ICD-10-CM | POA: Diagnosis present

## 2020-09-11 DIAGNOSIS — I639 Cerebral infarction, unspecified: Secondary | ICD-10-CM | POA: Diagnosis not present

## 2020-09-11 DIAGNOSIS — R471 Dysarthria and anarthria: Secondary | ICD-10-CM | POA: Diagnosis present

## 2020-09-11 DIAGNOSIS — I110 Hypertensive heart disease with heart failure: Secondary | ICD-10-CM | POA: Diagnosis present

## 2020-09-11 DIAGNOSIS — I6340 Cerebral infarction due to embolism of unspecified cerebral artery: Principal | ICD-10-CM | POA: Diagnosis present

## 2020-09-11 DIAGNOSIS — R29810 Facial weakness: Secondary | ICD-10-CM | POA: Diagnosis present

## 2020-09-11 DIAGNOSIS — Z823 Family history of stroke: Secondary | ICD-10-CM

## 2020-09-11 DIAGNOSIS — Z794 Long term (current) use of insulin: Secondary | ICD-10-CM

## 2020-09-11 DIAGNOSIS — R569 Unspecified convulsions: Secondary | ICD-10-CM

## 2020-09-11 DIAGNOSIS — E119 Type 2 diabetes mellitus without complications: Secondary | ICD-10-CM

## 2020-09-11 DIAGNOSIS — R29703 NIHSS score 3: Secondary | ICD-10-CM | POA: Diagnosis present

## 2020-09-11 DIAGNOSIS — Z599 Problem related to housing and economic circumstances, unspecified: Secondary | ICD-10-CM

## 2020-09-11 DIAGNOSIS — E11649 Type 2 diabetes mellitus with hypoglycemia without coma: Secondary | ICD-10-CM | POA: Diagnosis not present

## 2020-09-11 DIAGNOSIS — Z833 Family history of diabetes mellitus: Secondary | ICD-10-CM

## 2020-09-11 DIAGNOSIS — Z8249 Family history of ischemic heart disease and other diseases of the circulatory system: Secondary | ICD-10-CM

## 2020-09-11 DIAGNOSIS — I619 Nontraumatic intracerebral hemorrhage, unspecified: Secondary | ICD-10-CM | POA: Diagnosis present

## 2020-09-11 DIAGNOSIS — R253 Fasciculation: Secondary | ICD-10-CM | POA: Diagnosis present

## 2020-09-11 DIAGNOSIS — E860 Dehydration: Secondary | ICD-10-CM | POA: Diagnosis present

## 2020-09-11 DIAGNOSIS — E872 Acidosis: Secondary | ICD-10-CM | POA: Diagnosis present

## 2020-09-11 DIAGNOSIS — R4781 Slurred speech: Secondary | ICD-10-CM | POA: Diagnosis present

## 2020-09-11 DIAGNOSIS — I5042 Chronic combined systolic (congestive) and diastolic (congestive) heart failure: Secondary | ICD-10-CM | POA: Diagnosis present

## 2020-09-11 DIAGNOSIS — I513 Intracardiac thrombosis, not elsewhere classified: Secondary | ICD-10-CM | POA: Diagnosis present

## 2020-09-11 DIAGNOSIS — Z79899 Other long term (current) drug therapy: Secondary | ICD-10-CM

## 2020-09-11 DIAGNOSIS — Z20822 Contact with and (suspected) exposure to covid-19: Secondary | ICD-10-CM | POA: Diagnosis present

## 2020-09-11 DIAGNOSIS — Z8616 Personal history of COVID-19: Secondary | ICD-10-CM

## 2020-09-11 DIAGNOSIS — E785 Hyperlipidemia, unspecified: Secondary | ICD-10-CM | POA: Diagnosis present

## 2020-09-11 DIAGNOSIS — Z9114 Patient's other noncompliance with medication regimen: Secondary | ICD-10-CM

## 2020-09-11 LAB — BASIC METABOLIC PANEL
Anion gap: 13 (ref 5–15)
BUN: 15 mg/dL (ref 6–20)
CO2: 26 mmol/L (ref 22–32)
Calcium: 9.6 mg/dL (ref 8.9–10.3)
Chloride: 88 mmol/L — ABNORMAL LOW (ref 98–111)
Creatinine, Ser: 1.18 mg/dL (ref 0.61–1.24)
GFR, Estimated: >60 mL/min (ref >60)
Glucose, Bld: 797 mg/dL (ref 70–99)
Potassium: 4.8 mmol/L (ref 3.5–5.1)
Sodium: 127 mmol/L — ABNORMAL LOW (ref 135–145)

## 2020-09-11 LAB — CBC
HCT: 42 % (ref 39.0–52.0)
Hemoglobin: 14.5 g/dL (ref 13.0–17.0)
MCH: 28.7 pg (ref 26.0–34.0)
MCHC: 34.5 g/dL (ref 30.0–36.0)
MCV: 83 fL (ref 80.0–100.0)
Platelets: 411 10*3/uL — ABNORMAL HIGH (ref 150–400)
RBC: 5.06 MIL/uL (ref 4.22–5.81)
RDW: 11.4 % — ABNORMAL LOW (ref 11.5–15.5)
WBC: 5.2 10*3/uL (ref 4.0–10.5)
nRBC: 0 % (ref 0.0–0.2)

## 2020-09-11 LAB — URINALYSIS, ROUTINE W REFLEX MICROSCOPIC
Bacteria, UA: NONE SEEN
Bilirubin Urine: NEGATIVE
Glucose, UA: >=500 mg/dL — AB
Hgb urine dipstick: NEGATIVE
Ketones, ur: 5 mg/dL — AB
Leukocytes,Ua: NEGATIVE
Nitrite: NEGATIVE
Protein, ur: NEGATIVE mg/dL
Specific Gravity, Urine: 1.031 — ABNORMAL HIGH (ref 1.005–1.030)
pH: 5 (ref 5.0–8.0)

## 2020-09-11 LAB — HEPATIC FUNCTION PANEL
ALT: 17 U/L (ref 0–44)
AST: 19 U/L (ref 15–41)
Albumin: 3.8 g/dL (ref 3.5–5.0)
Alkaline Phosphatase: 70 U/L (ref 38–126)
Bilirubin, Direct: <0.1 mg/dL (ref 0.0–0.2)
Total Bilirubin: 0.7 mg/dL (ref 0.3–1.2)
Total Protein: 8.3 g/dL — ABNORMAL HIGH (ref 6.5–8.1)

## 2020-09-11 LAB — I-STAT VENOUS BLOOD GAS, ED
Acid-Base Excess: 4 mmol/L — ABNORMAL HIGH (ref 0.0–2.0)
Bicarbonate: 28.3 mmol/L — ABNORMAL HIGH (ref 20.0–28.0)
Calcium, Ion: 1.13 mmol/L — ABNORMAL LOW (ref 1.15–1.40)
HCT: 44 % (ref 39.0–52.0)
Hemoglobin: 15 g/dL (ref 13.0–17.0)
O2 Saturation: 97 %
Potassium: 4.9 mmol/L (ref 3.5–5.1)
Sodium: 130 mmol/L — ABNORMAL LOW (ref 135–145)
TCO2: 30 mmol/L (ref 22–32)
pCO2, Ven: 41.4 mmHg — ABNORMAL LOW (ref 44.0–60.0)
pH, Ven: 7.443 — ABNORMAL HIGH (ref 7.250–7.430)
pO2, Ven: 91 mmHg — ABNORMAL HIGH (ref 32.0–45.0)

## 2020-09-11 LAB — LACTIC ACID, PLASMA: Lactic Acid, Venous: 2.2 mmol/L (ref 0.5–1.9)

## 2020-09-11 LAB — OSMOLALITY: Osmolality: 323 mOsm/kg (ref 275–295)

## 2020-09-11 LAB — BETA-HYDROXYBUTYRIC ACID: Beta-Hydroxybutyric Acid: 1.05 mmol/L — ABNORMAL HIGH (ref 0.05–0.27)

## 2020-09-11 LAB — CBG MONITORING, ED: Glucose-Capillary: >600 mg/dL (ref 70–99)

## 2020-09-11 LAB — CK: Total CK: 67 U/L (ref 49–397)

## 2020-09-11 LAB — MAGNESIUM: Magnesium: 2.3 mg/dL (ref 1.7–2.4)

## 2020-09-11 MED ORDER — DEXTROSE IN LACTATED RINGERS 5 % IV SOLN: INTRAVENOUS | Status: DC

## 2020-09-11 MED ORDER — LACTATED RINGERS IV SOLN: INTRAVENOUS | Status: DC

## 2020-09-11 MED ORDER — DEXTROSE 50 % IV SOLN: 0.0000 mL | INTRAVENOUS | Status: DC | PRN

## 2020-09-11 MED ORDER — LACTATED RINGERS IV BOLUS
500.0000 mL | Freq: Once | INTRAVENOUS | Status: AC
  Administered 2020-09-11: 500 mL via INTRAVENOUS

## 2020-09-11 MED ORDER — LACTATED RINGERS IV BOLUS
20.0000 mL/kg | Freq: Once | INTRAVENOUS | Status: DC
Start: 2020-09-11 — End: 2020-09-11

## 2020-09-11 MED ORDER — GADOBUTROL 1 MMOL/ML IV SOLN
7.5000 mL | Freq: Once | INTRAVENOUS | Status: AC | PRN
  Administered 2020-09-11: 7.5 mL via INTRAVENOUS

## 2020-09-11 MED ORDER — SODIUM CHLORIDE 0.9 % IV SOLN
2000.0000 mg | Freq: Once | INTRAVENOUS | Status: AC
  Administered 2020-09-11: 2000 mg via INTRAVENOUS
  Filled 2020-09-11: qty 20

## 2020-09-11 MED ORDER — INSULIN REGULAR(HUMAN) IN NACL 100-0.9 UT/100ML-% IV SOLN
INTRAVENOUS | Status: DC
  Administered 2020-09-11: 11.5 [IU]/h via INTRAVENOUS
  Filled 2020-09-11: qty 100

## 2020-09-11 MED ORDER — POTASSIUM CHLORIDE 10 MEQ/100ML IV SOLN
10.0000 meq | INTRAVENOUS | Status: AC
  Administered 2020-09-11 – 2020-09-12 (×2): 10 meq via INTRAVENOUS
  Filled 2020-09-11 (×2): qty 100

## 2020-09-11 NOTE — ED Provider Notes (Signed)
[facility] EMERGENCY DEPARTMENT Provider Note   CSN: [id number] Arrival date & time: 09/11/20  2003     History Chief Complaint  Patient presents with  . Altered Mental Status    Jason Stevenson is a 35 y.o. male.   Hyperglycemia Blood sugar level PTA:  >600 Severity:  Severe Onset quality:  Gradual Duration:  5 days Timing:  Constant Progression:  Worsening Chronicity:  Recurrent Diabetes status:  Controlled with insulin Current diabetic therapy:  Long and short acting insulin, glipizide and metformin Time since last antidiabetic medication:  1 day Context: noncompliance   Relieved by:  Nothing Ineffective treatments:  None tried Associated symptoms: altered mental status, confusion, dehydration, nausea, polyuria and weakness   Associated symptoms: no abdominal pain, no blurred vision, no chest pain, no diaphoresis, no fever, no shortness of breath, no syncope and no vomiting    At the time of my exam patient was having a leftward gaze preference, drooling out of left of his mouth and not speaking.  Wife at bedside says that since he had COVID 1 mo ago, he has had these intermittent episodes of unresponsiveness where his left side goes "numb" with weakness and drooling. The episodes last anywhere from 10-76mns and resolve on their own. They have been occurring several times a day for several weeks now.  No recent head injury. No previous history of CVA.  History limited by patient mental status. Level 5 caveat applies.       Past Medical History:  Diagnosis Date  . Chronic systolic CHF (congestive heart failure) ([facility] 06/18/2015  . Diabetes mellitus without complication ([facility]   . Hyperlipidemia   . Hypertension   . Nonischemic cardiomyopathy ([facility] Noted as early as 2007   Per chart review (cards consult note 2011), EF of 40% in 2007, down to 20-25% in 2011    Patient Active Problem List   Diagnosis Date Noted  . Hyperosmolar hyperglycemic  state (HHS) ([facility] 09/11/2020  . COVID-19 08/23/2020  . Noncompliance with medication treatment due to intermittent use of medication 10/12/2018  . Dyslipidemia 04/17/2016  . Uncontrolled type 2 diabetes mellitus without complication, with long-term current use of insulin 08/07/2015  . Chronic systolic CHF (congestive heart failure) ([facility] 06/18/2015  . Needs flu shot 12/23/2013  . Type 2 diabetes mellitus ([facility] 11/18/2013  . Diabetes ([facility] 06/17/2013  . Non-ischemic cardiomyopathy ([facility] 11/06/2012  . HTN (hypertension) 11/06/2012  . HLD (hyperlipidemia) 11/06/2012    Past Surgical History:  Procedure Laterality Date  . None         Family History  Problem Relation Age of Onset  . Diabetes Mother   . Hypertension Mother   . Stroke Maternal Aunt   . Heart attack Neg Hx     Social History   Tobacco Use  . Smoking status: Never Smoker  . Smokeless tobacco: Never Used  Substance Use Topics  . Alcohol use: Yes    Comment: "occasional" when "hanging out with the wrong people" No recent use.  . Drug use: No    Types: Marijuana    Comment: Last in 2013    Home Medications Prior to Admission medications   Medication Sig Start Date End Date Taking? Authorizing Provider  acetaminophen (TYLENOL) 500 MG tablet Take 1,500 mg by mouth every 6 (six) hours as needed for moderate pain or headache.   Yes [provider]  ascorbic acid (VITAMIN C) 500 MG tablet Take 1,000 mg by mouth daily.   Yes  [provider]  Blood Glucose Monitoring Suppl (TRUE METRIX METER) w/Device KIT 1 each by Does not apply route 3 (three) times daily. 07/02/18  Yes Fulp, Cammie, MD  Blood Pressure Monitor DEVI Use as directed to check home blood pressure 2-3 times a week 10/12/18  Yes Ladell Pier, MD  carvedilol (COREG) 6.25 MG tablet Take 1.5 tablets (9.375 mg total) by mouth 2 (two) times daily with a meal. 11/25/19  Yes Ladell Pier, MD  cholecalciferol (VITAMIN D) 25 MCG (1000  UNIT) tablet Take 2,000 Units by mouth daily.   Yes [provider]  gabapentin (NEURONTIN) 300 MG capsule Take 1 capsule (300 mg total) by mouth at bedtime. Patient taking differently: Take 300 mg by mouth at bedtime as needed (pain). 11/25/19  Yes Ladell Pier, MD  glipiZIDE (GLUCOTROL) 10 MG tablet Take 1 tablet (10 mg total) by mouth 2 (two) times daily before a meal. To lower blood sugar 11/25/19  Yes Ladell Pier, MD  ibuprofen (ADVIL) 200 MG tablet Take 400 mg by mouth every 6 (six) hours as needed for moderate pain or headache.   Yes [provider]  insulin detemir (LEVEMIR) 100 UNIT/ML injection Inject 0.3 mLs (30 Units total) into the skin daily. 11/25/19  Yes Ladell Pier, MD  insulin lispro (HUMALOG) 100 UNIT/ML injection inj 10-20 units three time a day with meals Patient taking differently: Inject 10-20 Units into the skin See admin instructions. Per sliding scale with meals 11/25/19  Yes Ladell Pier, MD  Insulin Pen Needle (PEN NEEDLES) 31G X 8 MM MISC UAD 04/26/19  Yes Ladell Pier, MD  Insulin Syringe-Needle U-100 (RELION INSULIN SYRINGE) 31G X 15/64" 0.3 ML MISC Use to inject insulin daily. 07/13/18  Yes Ladell Pier, MD  MAGNESIUM OXIDE PO Take 2 tablets by mouth daily.   Yes [provider]  metFORMIN (GLUCOPHAGE) 500 MG tablet Take 1 tablet (500 mg total) by mouth 2 (two) times daily with a meal. TAKE 1 TABLET BY MOUTH 2 TIMES DAILY WITH A MEAL Patient taking differently: Take 500 mg by mouth 2 (two) times daily with a meal. 11/25/19  Yes Ladell Pier, MD  nitroGLYCERIN (NITROSTAT) 0.4 MG SL tablet Place 1 tablet (0.4 mg total) under the tongue every 5 (five) minutes as needed for chest pain. 02/20/15  Yes Josue Hector, MD  rosuvastatin (CRESTOR) 40 MG tablet Take 1 tablet (40 mg total) by mouth daily. 11/25/19  Yes Ladell Pier, MD  sacubitril-valsartan (ENTRESTO) 49-51 MG Take 1 tablet by mouth 2 (two) times  daily. 11/25/19  Yes Ladell Pier, MD  zinc gluconate 50 MG tablet Take 100 mg by mouth daily.   Yes [provider]  glucose blood test strip Use as instructed 07/02/18   Fulp, Cammie, MD  Omega-3 Fatty Acids (FISH OIL) 1000 MG CPDR Take 2,000 mg by mouth daily. Patient not taking: No sig reported 07/08/18   Ladell Pier, MD    Allergies    Patient has no known allergies.  Review of Systems   Review of Systems  Unable to perform ROS: Mental status change  Constitutional: Negative for diaphoresis and fever.  Eyes: Negative for blurred vision.  Respiratory: Negative for shortness of breath.   Cardiovascular: Negative for chest pain and syncope.  Gastrointestinal: Positive for nausea. Negative for abdominal pain and vomiting.  Endocrine: Positive for polyuria.  Neurological: Positive for weakness.  Psychiatric/Behavioral: Positive for confusion.  Physical Exam Updated Vital Signs BP 120/90   Pulse (!) 115   Temp (!) 97.4 F (36.3 C) (Axillary)   Resp 18   SpO2 99%   Physical Exam Vitals and nursing note reviewed.  Constitutional:      General: He is in acute distress.     Appearance: He is normal weight. He is ill-appearing.  HENT:     Head: Atraumatic.     Mouth/Throat:     Mouth: Mucous membranes are moist.     Pharynx: Oropharynx is clear. No pharyngeal swelling, oropharyngeal exudate or posterior oropharyngeal erythema.     Tonsils: No tonsillar exudate.  Eyes:     Extraocular Movements: Extraocular movements intact.     Conjunctiva/sclera: Conjunctivae normal.     Pupils: Pupils are equal.     Right eye: Pupil is sluggish. Pupil is round and reactive.     Left eye: Pupil is sluggish. Pupil is round and reactive.     Comments:  Leftward gaze preference however EOM are conjugate and intact. Will cross midline to look to the right  Cardiovascular:     Rate and Rhythm: Tachycardia present.     Pulses: Normal pulses.     Heart sounds: Normal  heart sounds.  Pulmonary:     Effort: Pulmonary effort is normal.     Breath sounds: Normal breath sounds and air entry. No decreased air movement.  Abdominal:     General: Abdomen is flat.     Tenderness: There is no abdominal tenderness.  Musculoskeletal:     Cervical back: Full passive range of motion without pain, normal range of motion and neck supple.     Right lower leg: No edema.     Left lower leg: No edema.  Neurological:     Mental Status: He is alert.     Comments:  Leftward gaze preference Left facial droop Drooling out of his left lower mouth Left lower facial and mouth twitching/spasms 1/5 strength in left upper extremity 0/5 strength left lower extremity No reaction to noxious stimuli left hemibody  He will follow commands in the right upper and lower extremity. He will look to the right when asked.  5/5 strength of right extremities. Sensation grossly intact RHB     ED Results / Procedures / Treatments   Labs (all labs ordered are listed, but only abnormal results are displayed) Labs Reviewed  URINALYSIS, ROUTINE W REFLEX MICROSCOPIC - Abnormal; Notable for the following components:      Result Value   Color, Urine STRAW (*)    Specific Gravity, Urine 1.031 (*)    Glucose, UA >=500 (*)    Ketones, ur 5 (*)    All other components within normal limits  BETA-HYDROXYBUTYRIC ACID - Abnormal; Notable for the following components:   Beta-Hydroxybutyric Acid 1.05 (*)    All other components within normal limits  CBC - Abnormal; Notable for the following components:   RDW 11.4 (*)    Platelets 411 (*)    All other components within normal limits  BASIC METABOLIC PANEL - Abnormal; Notable for the following components:   Sodium 127 (*)    Chloride 88 (*)    Glucose, Bld 797 (*)    All other components within normal limits  HEPATIC FUNCTION PANEL - Abnormal; Notable for the following components:   Total Protein 8.3 (*)    All other components within normal  limits  LACTIC ACID, PLASMA - Abnormal; Notable for the following components:  Lactic Acid, Venous 2.2 (*)    All other components within normal limits  OSMOLALITY - Abnormal; Notable for the following components:   Osmolality 323 (*)    All other components within normal limits  BASIC METABOLIC PANEL - Abnormal; Notable for the following components:   Sodium 128 (*)    Chloride 90 (*)    Glucose, Bld 707 (*)    All other components within normal limits  CBG MONITORING, ED - Abnormal; Notable for the following components:   Glucose-Capillary >600 (*)    All other components within normal limits  I-STAT VENOUS BLOOD GAS, ED - Abnormal; Notable for the following components:   pH, Ven 7.443 (*)    pCO2, Ven 41.4 (*)    pO2, Ven 91.0 (*)    Bicarbonate 28.3 (*)    Acid-Base Excess 4.0 (*)    Sodium 130 (*)    Calcium, Ion 1.13 (*)    All other components within normal limits  CK  MAGNESIUM  LACTIC ACID, PLASMA  BASIC METABOLIC PANEL  BASIC METABOLIC PANEL  BASIC METABOLIC PANEL  CBG MONITORING, ED  CBG MONITORING, ED    EKG EKG Interpretation  Date/Time:  Monday Sep 11 2020 20:26:58 EDT Ventricular Rate:  127 PR Interval:  138 QRS Duration: 113 QT Interval:  334 QTC Calculation: 486 R Axis:   117 Text Interpretation: Sinus tachycardia Consider right atrial enlargement Borderline intraventricular conduction delay Nonspecific T abnormalities, diffuse leads Borderline ST elevation, anterior leads Borderline prolonged QT interval When compared to prior, faster rate. No STEMI Confirmed by Antony Blackbird [phone]) on 09/11/2020 8:28:53 PM   Radiology CT Head Wo Contrast  Result Date: 09/11/2020 CLINICAL DATA:  35 year old male with seizure. EXAM: CT HEAD WITHOUT CONTRAST TECHNIQUE: Contiguous axial images were obtained from the base of the skull through the vertex without intravenous contrast. COMPARISON:  Head CT dated 11/15/2009. FINDINGS: Brain: Focal area of old infarct and  encephalomalacia involving the right posterior parietal lobe. Additional area of infarct in the right frontal lobe may be subacute. Further evaluation with MRI is recommended. The ventricles and sulci appropriate size for patient's age. There is no acute intracranial hemorrhage. No mass effect midline shift no extra-axial fluid collection. Vascular: No hyperdense vessel or unexpected calcification. Skull: Normal. Negative for fracture or focal lesion. Sinuses/Orbits: No acute finding. Other: None IMPRESSION: 1. No acute intracranial hemorrhage. 2. Probable subacute infarct in the right frontal lobe. Further evaluation with MRI is recommended. Old right parietal infarct. Electronically Signed   By: Anner Crete M.D.   On: 09/11/2020 21:44    Procedures Procedures   Medications Ordered in ED Medications  insulin regular, human (MYXREDLIN) 100 units/ 100 mL infusion (has no administration in time range)  lactated ringers infusion (0 mLs Intravenous Paused 09/11/20 2256)  dextrose 5 % in lactated ringers infusion (0 mLs Intravenous Hold 09/11/20 2205)  dextrose 50 % solution 0-50 mL (has no administration in time range)  potassium chloride 10 mEq in 100 mL IVPB (0 mEq Intravenous Paused 09/11/20 2256)  lactated ringers bolus 500 mL (0 mLs Intravenous Stopped 09/11/20 2147)  levETIRAcetam (KEPPRA) 2,000 mg in sodium chloride 0.9 % 250 mL IVPB (0 mg Intravenous Stopped 09/11/20 2216)  gadobutrol (GADAVIST) 1 MMOL/ML injection 7.5 mL (7.5 mLs Intravenous Contrast Given 09/11/20 2331)    ED Course  I have reviewed the triage vital signs and the nursing notes.  Pertinent labs & imaging results that were available during my care of the patient  were reviewed by me and considered in my medical decision making (see chart for details).    MDM Rules/Calculators/A&P                          This is a 35yo M with a history of insulin dependent DM, HFrEF(EF 15-20% in 2020), HTN and HLD who presented to the ED  with hyperglycemia and altered mental status with what appeared to be seizure like activity. He was protecting his airway. Primary concern for new partial seizure. However he was seemingly alert and was able to follow commands. He also had left sided neurologic deficits, apparently new. Also concerned for acute stroke however no true risk factors for this. Ordered for [facility] Neurology was consulted and evaluated patient at bedside. MRI recommended and ordered. Did recommend Keppra which I loaded him with. And strict correction of glucose.  ED course: Patient had return to BL mental and neurologic status without specific seizure tx treatment in the ED. CTH showing a possible area of subacute infarction in the R frontal lobe. Neurology aware. No evidence of DKA but presentation is most c/w HHS picture. Started on insulin infusion per protocol.  Admitted to medicine for further tx and workup.   Final Clinical Impression(s) / ED Diagnoses Final diagnoses:  Cerebrovascular accident (CVA), unspecified mechanism ([place])  Hyperosmolar hyperglycemic state (HHS) ([place])  Partial seizure [facility])    Rx / [place] Orders ED Discharge Orders    None       Pearson Grippe, DO 09/11/20 2339    Tegeler, Gwenyth Allegra, MD 09/14/20 1126

## 2020-09-11 NOTE — Consult Note (Signed)
NEURO HOSPITALIST CONSULT NOTE   Requestig physician: Dr. Sherry Ruffing  Reason for Consult: Intermittent left sided twitching  History obtained from:  Patient, Wife and Chart    HPI:                                                                                                                                          Jason Stevenson is an 35 y.o. male with DM2, nonischemic cardiomyopathy, chronic systolic CHF, HLD and HTN who presents to the ED with an 8 day history of intermittent left sided twitching. Wife states that he had a twitching spell that was unusually long today, approximately 30 minutes in duration, which prompted the visit to the ED. On initial exam by EDP, he was noted to have a left facial droop and mild LUE weakness, as well as dysarthria.   Of note, his blood glucose was > 600 in the ED. Wife states that the patient stopped checking his fingerstick glucose readings about 2 weeks ago. When asked why, the patient states "I got tired of taking them".   Past Medical History:  Diagnosis Date  . Chronic systolic CHF (congestive heart failure) ([place]) 06/18/2015  . Diabetes mellitus without complication ([place])   . Hyperlipidemia   . Hypertension   . Nonischemic cardiomyopathy ([place]) Noted as early as 2007   Per chart review (cards consult note 2011), EF of 40% in 2007, down to 20-25% in 2011    Past Surgical History:  Procedure Laterality Date  . None      Family History  Problem Relation Age of Onset  . Diabetes Mother   . Hypertension Mother   . Stroke Maternal Aunt   . Heart attack Neg Hx               Social History:  reports that he has never smoked. He has never used smokeless tobacco. He reports current alcohol use. He reports that he does not use drugs.  No Known Allergies  MEDICATIONS:                                                                                                                     No current facility-administered  medications on file prior to encounter.   Current Outpatient Medications on File Prior  to Encounter  Medication Sig Dispense Refill  . acetaminophen (TYLENOL) 500 MG tablet Take 1,500 mg by mouth every 6 (six) hours as needed for moderate pain or headache.    Marland Kitchen ascorbic acid (VITAMIN C) 500 MG tablet Take 1,000 mg by mouth daily.    . Blood Glucose Monitoring Suppl (TRUE METRIX METER) w/Device KIT 1 each by Does not apply route 3 (three) times daily. 1 kit 0  . Blood Pressure Monitor DEVI Use as directed to check home blood pressure 2-3 times a week 1 Device 0  . carvedilol (COREG) 6.25 MG tablet Take 1.5 tablets (9.375 mg total) by mouth 2 (two) times daily with a meal. 90 tablet 6  . cholecalciferol (VITAMIN D) 25 MCG (1000 UNIT) tablet Take 2,000 Units by mouth daily.    Marland Kitchen gabapentin (NEURONTIN) 300 MG capsule Take 1 capsule (300 mg total) by mouth at bedtime. (Patient taking differently: Take 300 mg by mouth at bedtime as needed (pain).) 30 capsule 3  . glipiZIDE (GLUCOTROL) 10 MG tablet Take 1 tablet (10 mg total) by mouth 2 (two) times daily before a meal. To lower blood sugar 60 tablet 4  . ibuprofen (ADVIL) 200 MG tablet Take 400 mg by mouth every 6 (six) hours as needed for moderate pain or headache.    . insulin detemir (LEVEMIR) 100 UNIT/ML injection Inject 0.3 mLs (30 Units total) into the skin daily. 10 mL 5  . insulin lispro (HUMALOG) 100 UNIT/ML injection inj 10-20 units three time a day with meals (Patient taking differently: Inject 10-20 Units into the skin See admin instructions. Per sliding scale with meals) 10 mL 6  . Insulin Pen Needle (PEN NEEDLES) 31G X 8 MM MISC UAD 100 each 6  . Insulin Syringe-Needle U-100 (RELION INSULIN SYRINGE) 31G X 15/64" 0.3 ML MISC Use to inject insulin daily. 100 each 11  . MAGNESIUM OXIDE PO Take 2 tablets by mouth daily.    . metFORMIN (GLUCOPHAGE) 500 MG tablet Take 1 tablet (500 mg total) by mouth 2 (two) times daily with a meal. TAKE 1 TABLET  BY MOUTH 2 TIMES DAILY WITH A MEAL (Patient taking differently: Take 500 mg by mouth 2 (two) times daily with a meal.) 60 tablet 6  . nitroGLYCERIN (NITROSTAT) 0.4 MG SL tablet Place 1 tablet (0.4 mg total) under the tongue every 5 (five) minutes as needed for chest pain. 25 tablet 2  . rosuvastatin (CRESTOR) 40 MG tablet Take 1 tablet (40 mg total) by mouth daily. 90 tablet 3  . sacubitril-valsartan (ENTRESTO) 49-51 MG Take 1 tablet by mouth 2 (two) times daily. 60 tablet 2  . zinc gluconate 50 MG tablet Take 100 mg by mouth daily.    Marland Kitchen glucose blood test strip Use as instructed 100 each 12  . Omega-3 Fatty Acids (FISH OIL) 1000 MG CPDR Take 2,000 mg by mouth daily. (Patient not taking: No sig reported) 180 capsule 0    ROS:  As per HPI. Does not endorse any additional complaints.    Blood pressure (!) 126/97, pulse (!) 114, temperature (!) 97.4 F (36.3 C), temperature source Axillary, resp. rate (!) 23, SpO2 98 %.   General Examination:                                                                                                       Physical Exam  HEENT-  [place]/AT.   Lungs- Respirations unlabored Extremities- No edema  Neurological Examination Mental Status: Awake and alert. Oriented x 5. Speech with mild dysarthria, but fluent. Comprehension and naming intact. No dyscalculia. Mild cognitive slowing noted.  Cranial Nerves: II: All 4 visual quadrants intact OU but with intermittent left sided extinction to DSS. PERRL.   III,IV, VI: EOMI without nystagmus.  V,VII: Left sided facial droop, lower quadrant. Temp sensation intact bilaterally.  VIII: Hearing intact to conversation IX,X: No hypophonia XI: Head is midline XII: Tongue protrudes to the left.  Motor: RUE and RLE 5/5 LUE with subtle 4+/5 weakness of deltoid and biceps LLE 5/5 Sensory: Temp  and light touch intact throughout, bilaterally. No extinction to DSS.  Deep Tendon Reflexes: 1+ and symmetric throughout Plantars: Right: downgoing  Left: downgoing Cerebellar: No ataxia with FNF and H-S bilaterally  Gait: Deferred   Lab Results: Basic Metabolic Panel: Recent Labs  Lab 09/11/20 2019 09/11/20 2037  NA 127* 130*  K 4.8 4.9  CL 88*  --   CO2 26  --   GLUCOSE 797*  --   BUN 15  --   CREATININE 1.18  --   CALCIUM 9.6  --     CBC: Recent Labs  Lab 09/11/20 2019 09/11/20 2037  WBC 5.2  --   HGB 14.5 15.0  HCT 42.0 44.0  MCV 83.0  --   PLT 411*  --     Cardiac Enzymes: No results for input(s): CKTOTAL, CKMB, CKMBINDEX, TROPONINI in the last 168 hours.  Lipid Panel: No results for input(s): CHOL, TRIG, HDL, CHOLHDL, VLDL, LDLCALC in the last 168 hours.  Imaging: No results found.  Assessment: 34 year old male presenting with new onset of left sided twitching, left facial droop and severe hyperglycemia.  1. Exam reveals left sided motor deficits, dysarthria and intermittent left sided visual extinction. No twitching noted at the time of exam.  2. 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 A right parietal infarct is also noted, appearing chronic in age.  3. Left sided intermittent twitching may be secondary to seizure activity. Strokes seen on CT could serve as seizure foci. Severe hyperglycemia can also be associated with focal motor seizure activity.   Recommendations: 1. Stroke work up to include CTA of head and neck, TTE and cardiac telemetry. 2. Due to unusual presentation will need MRI brain to be with and without contrast.  3. Keppra 2000 mg IV load followed by 500 mg PO q12h.  4. Management of severe hyperglycemia per primary team.  5. EEG in AM.  6. PT/OT/Speech 7. Frequent neuro  checks.  8. BP management with SBP goal of 120-140. Out of permissive HTN time window due to initial symptoms occurring 8  days ago (last Monday).   Addendum: MRI brain:  1. Probable subacute hemorrhagic infarcts involving the anterior right frontal lobe and left parieto-occipital cortex as above, with additional chronic right parietal infarct. 2. Superimposed gyriform diffusion abnormality and enhancement involving the right frontotemporal region, favored to reflect changes of acute seizure. Correlation with EEG recommended. Possible ischemic change would be the primary differential consideration. 3. Abnormal symmetric T2/FLAIR signal abnormality involving the caudate and lentiform nuclei bilaterally, nonspecific, but most suggestive of superimposed/concomitant toxic metabolic derangement or other toxic insult. Correlation with history and laboratory values recommended. 4. A short interval follow-up brain MRI to ensure that these changes resolve is recommended.  Electronically signed: Dr. Kerney Elbe 09/11/2020, 9:31 PM

## 2020-09-11 NOTE — ED Triage Notes (Signed)
Pt BIB S/O c/o onset of AMS x1 hour. States similar symptoms in the past after COVID. Pt usually last about 1 hour. Pt makes eye contact but nonverbal.

## 2020-09-11 NOTE — ED Notes (Signed)
Pat to MRI 

## 2020-09-11 NOTE — ED Notes (Signed)
Returned from MRI at this time.

## 2020-09-11 NOTE — H&P (Signed)
History and Physical    Jason Stevenson BDZ:[id number] DOB: Mar 16, 1986 DOA: 09/11/2020  PCP: Ladell Pier, MD Patient coming from: Home  Chief Complaint: Altered mental status  HPI: Jason Stevenson is a 35 y.o. male with medical history significant of chronic systolic CHF (EF 15 to 34% on echo done 10/19/2018), insulin-dependent type 2 diabetes, hypertension, hyperlipidemia, COVID infection a month ago presented to the ED via EMS for evaluation of left-sided neurologic deficits and dysarthria.  Patient was tachycardic and hypertensive on arrival.  Not febrile or hypoxic.  Labs showing WBC 5.2, hemoglobin 14.5, platelet count 411K.  Sodium 127, potassium 4.8, chloride 88, bicarb 26, anion gap 13, BUN 15, creatinine 1.1, glucose 797.  Beta hydroxybutyric acid 1.0.  VBG with pH 7.44.  Serum osmolality 323.  Initial lactic acid 2.2, repeat pending.  CK normal.  Magnesium within normal range. Patient was seen by neurology and head CT was negative for acute finding, showing probable subacute infarct in the right frontal lobe and old right parietal infarct.  Patient's neurologic deficits resolved in the ED.  Neurology felt that the patient's symptoms could be due to seizure versus stroke.  Keppra loading dose was given.  Recommended obtaining MRI and EEG. Patient was started on insulin infusion and IV fluids for hyperglycemia.  Wife states patient had COVID infection at the end of April last month.  Since then he has had intermittent episodes where his face starts twitching on the left along with left arm twitching.  These episodes usually last a few minutes but today she became worried as patient had a similar episode where his face was twitching on the left and his left arm twitching.  He was not able to use the left side of his body and was unresponsive.  This episode lasted about an hour.  He does not have a history of seizures.  Wife states patient is supposed to be on insulin for diabetes but does  not take his medications.  Patient also confirms that he is not taking any of his home medications and has not seen a primary care physician in a long time.  Review of Systems:  All systems reviewed and apart from history of presenting illness, are negative.  Past Medical History:  Diagnosis Date  . Chronic systolic CHF (congestive heart failure) ([place]) 06/18/2015  . Diabetes mellitus without complication ([place])   . Hyperlipidemia   . Hypertension   . Nonischemic cardiomyopathy ([place]) Noted as early as 2007   Per chart review (cards consult note 2011), EF of 40% in 2007, down to 20-25% in 2011    Past Surgical History:  Procedure Laterality Date  . None       reports that he has never smoked. He has never used smokeless tobacco. He reports current alcohol use. He reports that he does not use drugs.  No Known Allergies  Family History  Problem Relation Age of Onset  . Diabetes Mother   . Hypertension Mother   . Stroke Maternal Aunt   . Heart attack Neg Hx     Prior to Admission medications   Medication Sig Start Date End Date Taking? Authorizing Provider  acetaminophen (TYLENOL) 500 MG tablet Take 1,500 mg by mouth every 6 (six) hours as needed for moderate pain or headache.   Yes [provider]  ascorbic acid (VITAMIN C) 500 MG tablet Take 1,000 mg by mouth daily.   Yes [provider]  Blood Glucose Monitoring Suppl (TRUE METRIX METER) w/Device KIT  1 each by Does not apply route 3 (three) times daily. 07/02/18  Yes Fulp, Cammie, MD  Blood Pressure Monitor DEVI Use as directed to check home blood pressure 2-3 times a week 10/12/18  Yes Ladell Pier, MD  carvedilol (COREG) 6.25 MG tablet Take 1.5 tablets (9.375 mg total) by mouth 2 (two) times daily with a meal. 11/25/19  Yes Ladell Pier, MD  cholecalciferol (VITAMIN D) 25 MCG (1000 UNIT) tablet Take 2,000 Units by mouth daily.   Yes [provider]  gabapentin (NEURONTIN) 300 MG capsule Take 1  capsule (300 mg total) by mouth at bedtime. Patient taking differently: Take 300 mg by mouth at bedtime as needed (pain). 11/25/19  Yes Ladell Pier, MD  glipiZIDE (GLUCOTROL) 10 MG tablet Take 1 tablet (10 mg total) by mouth 2 (two) times daily before a meal. To lower blood sugar 11/25/19  Yes Ladell Pier, MD  ibuprofen (ADVIL) 200 MG tablet Take 400 mg by mouth every 6 (six) hours as needed for moderate pain or headache.   Yes [provider]  insulin detemir (LEVEMIR) 100 UNIT/ML injection Inject 0.3 mLs (30 Units total) into the skin daily. 11/25/19  Yes Ladell Pier, MD  insulin lispro (HUMALOG) 100 UNIT/ML injection inj 10-20 units three time a day with meals Patient taking differently: Inject 10-20 Units into the skin See admin instructions. Per sliding scale with meals 11/25/19  Yes Ladell Pier, MD  Insulin Pen Needle (PEN NEEDLES) 31G X 8 MM MISC UAD 04/26/19  Yes Ladell Pier, MD  Insulin Syringe-Needle U-100 (RELION INSULIN SYRINGE) 31G X 15/64" 0.3 ML MISC Use to inject insulin daily. 07/13/18  Yes Ladell Pier, MD  MAGNESIUM OXIDE PO Take 2 tablets by mouth daily.   Yes [provider]  metFORMIN (GLUCOPHAGE) 500 MG tablet Take 1 tablet (500 mg total) by mouth 2 (two) times daily with a meal. TAKE 1 TABLET BY MOUTH 2 TIMES DAILY WITH A MEAL Patient taking differently: Take 500 mg by mouth 2 (two) times daily with a meal. 11/25/19  Yes Ladell Pier, MD  nitroGLYCERIN (NITROSTAT) 0.4 MG SL tablet Place 1 tablet (0.4 mg total) under the tongue every 5 (five) minutes as needed for chest pain. 02/20/15  Yes Josue Hector, MD  rosuvastatin (CRESTOR) 40 MG tablet Take 1 tablet (40 mg total) by mouth daily. 11/25/19  Yes Ladell Pier, MD  sacubitril-valsartan (ENTRESTO) 49-51 MG Take 1 tablet by mouth 2 (two) times daily. 11/25/19  Yes Ladell Pier, MD  zinc gluconate 50 MG tablet Take 100 mg by mouth daily.   Yes [provider]  glucose blood test strip Use as instructed 07/02/18   Fulp, Cammie, MD  Omega-3 Fatty Acids (FISH OIL) 1000 MG CPDR Take 2,000 mg by mouth daily. Patient not taking: No sig reported 07/08/18   Ladell Pier, MD    Physical Exam: Vitals:   09/11/20 2200 09/11/20 2215 09/11/20 2230 09/11/20 2345  BP: 113/86 (!) 125/96 120/90 112/89  Pulse: (!) 109 (!) 108 (!) 115 (!) 111  Resp: '19 17 18 18  ' Temp:      TempSrc:      SpO2: 99% 98% 99% 93%    Physical Exam Constitutional:      General: He is not in acute distress. HENT:     Head: Normocephalic and atraumatic.  Eyes:     Extraocular Movements: Extraocular movements intact.  Conjunctiva/sclera: Conjunctivae normal.  Cardiovascular:     Rate and Rhythm: Normal rate and regular rhythm.     Pulses: Normal pulses.  Pulmonary:     Effort: Pulmonary effort is normal. No respiratory distress.     Breath sounds: Normal breath sounds. No wheezing or rales.  Abdominal:     General: Bowel sounds are normal. There is no distension.     Palpations: Abdomen is soft.     Tenderness: There is no abdominal tenderness.  Musculoskeletal:        General: No swelling or tenderness.     Cervical back: Normal range of motion and neck supple.  Skin:    General: Skin is warm and dry.  Neurological:     Mental Status: He is alert and oriented to person, place, and time.     Comments: Left facial droop Strength 5 out of 5 in bilateral upper and lower extremities Sensation to light touch intact throughout     Labs on Admission: I have personally reviewed following labs and imaging studies  CBC: Recent Labs  Lab 09/11/20 2019 09/11/20 2037  WBC 5.2  --   HGB 14.5 15.0  HCT 42.0 44.0  MCV 83.0  --   PLT 411*  --    Basic Metabolic Panel: Recent Labs  Lab 09/11/20 2019 09/11/20 2037 09/11/20 2039 09/11/20 2159  NA 127* 130*  --  128*  K 4.8 4.9  --  4.9  CL 88*  --   --  90*  CO2 26  --   --  27  GLUCOSE 797*   --   --  707*  BUN 15  --   --  15  CREATININE 1.18  --   --  1.14  CALCIUM 9.6  --   --  9.4  MG  --   --  2.3  --    GFR: CrCl cannot be calculated (Unknown ideal weight.). Liver Function Tests: Recent Labs  Lab 09/11/20 2019  AST 19  ALT 17  ALKPHOS 70  BILITOT 0.7  PROT 8.3*  ALBUMIN 3.8   No results for input(s): LIPASE, AMYLASE in the last 168 hours. No results for input(s): AMMONIA in the last 168 hours. Coagulation Profile: No results for input(s): INR, PROTIME in the last 168 hours. Cardiac Enzymes: Recent Labs  Lab 09/11/20 2039  CKTOTAL 67   BNP (last 3 results) No results for input(s): PROBNP in the last 8760 hours. HbA1C: No results for input(s): HGBA1C in the last 72 hours. CBG: Recent Labs  Lab 09/11/20 2007 09/12/20 0021  GLUCAP >600* 495*   Lipid Profile: No results for input(s): CHOL, HDL, LDLCALC, TRIG, CHOLHDL, LDLDIRECT in the last 72 hours. Thyroid Function Tests: No results for input(s): TSH, T4TOTAL, FREET4, T3FREE, THYROIDAB in the last 72 hours. Anemia Panel: No results for input(s): VITAMINB12, FOLATE, FERRITIN, TIBC, IRON, RETICCTPCT in the last 72 hours. Urine analysis:    Component Value Date/Time   COLORURINE STRAW (A) 09/11/2020 2014   APPEARANCEUR CLEAR 09/11/2020 2014   LABSPEC 1.031 (H) 09/11/2020 2014   PHURINE 5.0 09/11/2020 2014   GLUCOSEU >=500 (A) 09/11/2020 2014   HGBUR NEGATIVE 09/11/2020 2014   BILIRUBINUR NEGATIVE 09/11/2020 2014   BILIRUBINUR neg 04/17/2016 1134   KETONESUR 5 (A) 09/11/2020 2014   PROTEINUR NEGATIVE 09/11/2020 2014   UROBILINOGEN 1.0 04/17/2016 1134   UROBILINOGEN 1.0 11/05/2012 1608   NITRITE NEGATIVE 09/11/2020 2014   LEUKOCYTESUR NEGATIVE 09/11/2020 2014    Radiological  Exams on Admission: CT Head Wo Contrast  Result Date: 09/11/2020 CLINICAL DATA:  35 year old male with seizure. EXAM: CT HEAD WITHOUT CONTRAST TECHNIQUE: Contiguous axial images were obtained from the base of the skull  through the vertex without intravenous contrast. COMPARISON:  Head CT dated 11/15/2009. FINDINGS: Brain: Focal area of old infarct and encephalomalacia involving the right posterior parietal lobe. Additional area of infarct in the right frontal lobe may be subacute. Further evaluation with MRI is recommended. The ventricles and sulci appropriate size for patient's age. There is no acute intracranial hemorrhage. No mass effect midline shift no extra-axial fluid collection. Vascular: No hyperdense vessel or unexpected calcification. Skull: Normal. Negative for fracture or focal lesion. Sinuses/Orbits: No acute finding. Other: None IMPRESSION: 1. No acute intracranial hemorrhage. 2. Probable subacute infarct in the right frontal lobe. Further evaluation with MRI is recommended. Old right parietal infarct. Electronically Signed   By: Anner Crete M.D.   On: 09/11/2020 21:44    EKG: Independently reviewed.  Sinus tachycardia, baseline wander, T wave abnormality in lateral leads similar to prior tracing.  No STEMI.  Assessment/Plan Principal Problem:   Neurological deficit present Active Problems:   HTN (hypertension)   HLD (hyperlipidemia)   Type 2 diabetes mellitus ([place])   Hyperosmolar hyperglycemic state (HHS) [facility])   Neurologic deficits Patient presented to the ED with left-sided neurologic deficits and dysarthria. Head CT negative for acute finding, showing probable subacute infarct in the right frontal lobe and old right parietal infarct.  Neurologic deficits resolved in the ED.  Neurology felt that the patient's symptoms could be due to seizure versus stroke.  Keppra loading dose was given.  Recommended obtaining MRI and EEG.  -MRI brain with and without contrast.  EEG, seizure precautions.  A1c, lipid panel.  Echocardiogram.  PT/OT/SLP.  Frequent neurochecks.  Further work-up based on recommendations by neurology.  HHS Due to noncompliance.  Patient stopped using insulin and all of his home  medications a while ago.  Blood glucose significantly elevated at 797 and serum osmolality elevated.  DKA less likely given normal bicarb and anion gap, no significant ketones on UA and only mild elevation of beta hydroxybutyric acid.  VBG with pH 7.44. -Continue insulin infusion and IV fluids.  Frequent CBG checks per Endo tool.  Monitor BMP every 4 hours.  Check A1c.  Transition to subcutaneous insulin and diet when hyperglycemia resolves.  Consult diabetes coordinator.  Mild lactic acidosis Likely due to dehydration.  No fever or leukocytosis to suggest infection. -IV fluid hydration, continue to monitor  Chronic systolic CHF EF 15 to 69% on echo done 10/19/2018).  No signs of volume overload at this time.  Patient is not taking any medications. -Repeat echocardiogram ordered, consider consulting cardiology in the morning.  Monitor volume status closely.  Hypertension Currently normotensive. -Blood pressure parameters per neurology  Hyperlipidemia Patient is not taking any medications. -Lipid panel  DVT prophylaxis: Lovenox Code Status: Full code Family Communication: Wife at bedside. Disposition Plan: Status is: Observation  The patient remains OBS appropriate and will d/c before 2 midnights.  Dispo: The patient is from: Home              Anticipated d/c is to: Home              Patient currently is not medically stable to d/c.   Difficult to place patient No  Level of care: Level of care: Progressive   The medical decision making on this patient was of  high complexity and the patient is at high risk for clinical deterioration, therefore this is a level 3 visit.  Shela Leff MD [facility]  If 7PM-7AM, please contact night-coverage [email or web address]  09/12/2020, 12:33 AM

## 2020-09-11 NOTE — ED Notes (Signed)
Pat to CT

## 2020-09-12 ENCOUNTER — Observation Stay (HOSPITAL_COMMUNITY): Payer: Medicaid Other

## 2020-09-12 ENCOUNTER — Other Ambulatory Visit (HOSPITAL_COMMUNITY): Payer: Medicaid Other

## 2020-09-12 ENCOUNTER — Telehealth: Payer: Self-pay | Admitting: Nurse Practitioner

## 2020-09-12 ENCOUNTER — Inpatient Hospital Stay (HOSPITAL_COMMUNITY): Payer: Medicaid Other

## 2020-09-12 ENCOUNTER — Encounter (HOSPITAL_COMMUNITY): Payer: Self-pay | Admitting: Internal Medicine

## 2020-09-12 DIAGNOSIS — Z20822 Contact with and (suspected) exposure to covid-19: Secondary | ICD-10-CM | POA: Diagnosis not present

## 2020-09-12 DIAGNOSIS — E1165 Type 2 diabetes mellitus with hyperglycemia: Secondary | ICD-10-CM

## 2020-09-12 DIAGNOSIS — I5022 Chronic systolic (congestive) heart failure: Secondary | ICD-10-CM | POA: Diagnosis not present

## 2020-09-12 DIAGNOSIS — E872 Acidosis: Secondary | ICD-10-CM | POA: Diagnosis not present

## 2020-09-12 DIAGNOSIS — E1100 Type 2 diabetes mellitus with hyperosmolarity without nonketotic hyperglycemic-hyperosmolar coma (NKHHC): Secondary | ICD-10-CM | POA: Diagnosis not present

## 2020-09-12 DIAGNOSIS — R29703 NIHSS score 3: Secondary | ICD-10-CM | POA: Diagnosis present

## 2020-09-12 DIAGNOSIS — I10 Essential (primary) hypertension: Secondary | ICD-10-CM | POA: Diagnosis not present

## 2020-09-12 DIAGNOSIS — R4781 Slurred speech: Secondary | ICD-10-CM | POA: Diagnosis present

## 2020-09-12 DIAGNOSIS — R569 Unspecified convulsions: Secondary | ICD-10-CM

## 2020-09-12 DIAGNOSIS — Z8616 Personal history of COVID-19: Secondary | ICD-10-CM | POA: Insufficient documentation

## 2020-09-12 DIAGNOSIS — E785 Hyperlipidemia, unspecified: Secondary | ICD-10-CM | POA: Diagnosis not present

## 2020-09-12 DIAGNOSIS — I513 Intracardiac thrombosis, not elsewhere classified: Secondary | ICD-10-CM | POA: Diagnosis not present

## 2020-09-12 DIAGNOSIS — I619 Nontraumatic intracerebral hemorrhage, unspecified: Secondary | ICD-10-CM | POA: Diagnosis present

## 2020-09-12 DIAGNOSIS — E119 Type 2 diabetes mellitus without complications: Secondary | ICD-10-CM | POA: Diagnosis not present

## 2020-09-12 DIAGNOSIS — Z8249 Family history of ischemic heart disease and other diseases of the circulatory system: Secondary | ICD-10-CM | POA: Diagnosis not present

## 2020-09-12 DIAGNOSIS — I110 Hypertensive heart disease with heart failure: Secondary | ICD-10-CM | POA: Diagnosis not present

## 2020-09-12 DIAGNOSIS — I639 Cerebral infarction, unspecified: Secondary | ICD-10-CM | POA: Diagnosis present

## 2020-09-12 DIAGNOSIS — R29810 Facial weakness: Secondary | ICD-10-CM | POA: Diagnosis present

## 2020-09-12 DIAGNOSIS — R29818 Other symptoms and signs involving the nervous system: Secondary | ICD-10-CM | POA: Diagnosis present

## 2020-09-12 DIAGNOSIS — I5042 Chronic combined systolic (congestive) and diastolic (congestive) heart failure: Secondary | ICD-10-CM | POA: Diagnosis not present

## 2020-09-12 DIAGNOSIS — E782 Mixed hyperlipidemia: Secondary | ICD-10-CM | POA: Diagnosis not present

## 2020-09-12 DIAGNOSIS — Z833 Family history of diabetes mellitus: Secondary | ICD-10-CM | POA: Diagnosis not present

## 2020-09-12 DIAGNOSIS — Z794 Long term (current) use of insulin: Secondary | ICD-10-CM

## 2020-09-12 DIAGNOSIS — I6340 Cerebral infarction due to embolism of unspecified cerebral artery: Secondary | ICD-10-CM | POA: Diagnosis not present

## 2020-09-12 DIAGNOSIS — E860 Dehydration: Secondary | ICD-10-CM | POA: Diagnosis not present

## 2020-09-12 DIAGNOSIS — R253 Fasciculation: Secondary | ICD-10-CM | POA: Diagnosis present

## 2020-09-12 DIAGNOSIS — I6389 Other cerebral infarction: Secondary | ICD-10-CM | POA: Diagnosis not present

## 2020-09-12 DIAGNOSIS — Z823 Family history of stroke: Secondary | ICD-10-CM | POA: Diagnosis not present

## 2020-09-12 DIAGNOSIS — E11649 Type 2 diabetes mellitus with hypoglycemia without coma: Secondary | ICD-10-CM | POA: Diagnosis not present

## 2020-09-12 DIAGNOSIS — R471 Dysarthria and anarthria: Secondary | ICD-10-CM | POA: Diagnosis present

## 2020-09-12 DIAGNOSIS — Z9119 Patient's noncompliance with other medical treatment and regimen: Secondary | ICD-10-CM | POA: Diagnosis not present

## 2020-09-12 DIAGNOSIS — I428 Other cardiomyopathies: Secondary | ICD-10-CM | POA: Diagnosis not present

## 2020-09-12 LAB — CBG MONITORING, ED
Glucose-Capillary: 115 mg/dL — ABNORMAL HIGH (ref 70–99)
Glucose-Capillary: 163 mg/dL — ABNORMAL HIGH (ref 70–99)
Glucose-Capillary: 171 mg/dL — ABNORMAL HIGH (ref 70–99)
Glucose-Capillary: 180 mg/dL — ABNORMAL HIGH (ref 70–99)
Glucose-Capillary: 194 mg/dL — ABNORMAL HIGH (ref 70–99)
Glucose-Capillary: 207 mg/dL — ABNORMAL HIGH (ref 70–99)
Glucose-Capillary: 287 mg/dL — ABNORMAL HIGH (ref 70–99)
Glucose-Capillary: 319 mg/dL — ABNORMAL HIGH (ref 70–99)
Glucose-Capillary: 357 mg/dL — ABNORMAL HIGH (ref 70–99)
Glucose-Capillary: 495 mg/dL — ABNORMAL HIGH (ref 70–99)

## 2020-09-12 LAB — BASIC METABOLIC PANEL
Anion gap: 11 (ref 5–15)
Anion gap: 11 (ref 5–15)
Anion gap: 10 (ref 5–15)
BUN: 15 mg/dL (ref 6–20)
BUN: 13 mg/dL (ref 6–20)
BUN: 12 mg/dL (ref 6–20)
CO2: 27 mmol/L (ref 22–32)
CO2: 26 mmol/L (ref 22–32)
CO2: 28 mmol/L (ref 22–32)
Calcium: 9.4 mg/dL (ref 8.9–10.3)
Calcium: 9.6 mg/dL (ref 8.9–10.3)
Calcium: 9.7 mg/dL (ref 8.9–10.3)
Chloride: 90 mmol/L — ABNORMAL LOW (ref 98–111)
Chloride: 98 mmol/L (ref 98–111)
Chloride: 101 mmol/L (ref 98–111)
Creatinine, Ser: 1.14 mg/dL (ref 0.61–1.24)
Creatinine, Ser: 0.9 mg/dL (ref 0.61–1.24)
Creatinine, Ser: 0.91 mg/dL (ref 0.61–1.24)
GFR, Estimated: >60 mL/min (ref >60)
GFR, Estimated: >60 mL/min (ref >60)
GFR, Estimated: >60 mL/min (ref >60)
Glucose, Bld: 707 mg/dL (ref 70–99)
Glucose, Bld: 340 mg/dL — ABNORMAL HIGH (ref 70–99)
Glucose, Bld: 119 mg/dL — ABNORMAL HIGH (ref 70–99)
Potassium: 4.9 mmol/L (ref 3.5–5.1)
Potassium: 3.9 mmol/L (ref 3.5–5.1)
Potassium: 3.7 mmol/L (ref 3.5–5.1)
Sodium: 128 mmol/L — ABNORMAL LOW (ref 135–145)
Sodium: 135 mmol/L (ref 135–145)
Sodium: 139 mmol/L (ref 135–145)

## 2020-09-12 LAB — LIPID PANEL
Cholesterol: 380 mg/dL — ABNORMAL HIGH (ref 0–200)
HDL: 45 mg/dL (ref >40)
LDL Cholesterol: 305 mg/dL — ABNORMAL HIGH (ref 0–99)
Total CHOL/HDL Ratio: 8.4 RATIO
Triglycerides: 149 mg/dL (ref <150)
VLDL: 30 mg/dL (ref 0–40)

## 2020-09-12 LAB — RAPID URINE DRUG SCREEN, HOSP PERFORMED
Amphetamines: NOT DETECTED
Barbiturates: NOT DETECTED
Benzodiazepines: NOT DETECTED
Cocaine: NOT DETECTED
Opiates: NOT DETECTED
Tetrahydrocannabinol: NOT DETECTED

## 2020-09-12 LAB — OSMOLALITY: Osmolality: 307 mOsm/kg — ABNORMAL HIGH (ref 275–295)

## 2020-09-12 LAB — HIV ANTIBODY (ROUTINE TESTING W REFLEX): HIV Screen 4th Generation wRfx: NONREACTIVE

## 2020-09-12 LAB — GLUCOSE, CAPILLARY
Glucose-Capillary: 314 mg/dL — ABNORMAL HIGH (ref 70–99)
Glucose-Capillary: 181 mg/dL — ABNORMAL HIGH (ref 70–99)

## 2020-09-12 LAB — SARS CORONAVIRUS 2 (TAT 6-24 HRS): SARS Coronavirus 2: NEGATIVE

## 2020-09-12 MED ORDER — IOHEXOL 350 MG/ML SOLN
50.0000 mL | Freq: Once | INTRAVENOUS | Status: AC | PRN
  Administered 2020-09-12: 50 mL via INTRAVENOUS

## 2020-09-12 MED ORDER — ENOXAPARIN SODIUM 40 MG/0.4ML IJ SOSY: 40.0000 mg | PREFILLED_SYRINGE | Freq: Every day | INTRAMUSCULAR | Status: DC

## 2020-09-12 MED ORDER — LIVING WELL WITH DIABETES BOOK
Freq: Once | Status: DC
  Filled 2020-09-12: qty 1

## 2020-09-12 MED ORDER — INSULIN ASPART 100 UNIT/ML IJ SOLN
0.0000 [IU] | Freq: Three times a day (TID) | INTRAMUSCULAR | Status: DC
  Administered 2020-09-12: 11 [IU] via SUBCUTANEOUS
  Administered 2020-09-13 (×2): 5 [IU] via SUBCUTANEOUS
  Administered 2020-09-13: 3 [IU] via SUBCUTANEOUS
  Administered 2020-09-14: 5 [IU] via SUBCUTANEOUS
  Administered 2020-09-14 (×2): 2 [IU] via SUBCUTANEOUS
  Administered 2020-09-15: 5 [IU] via SUBCUTANEOUS
  Administered 2020-09-16: 2 [IU] via SUBCUTANEOUS
  Administered 2020-09-16 – 2020-09-17 (×3): 3 [IU] via SUBCUTANEOUS
  Administered 2020-09-18: 2 [IU] via SUBCUTANEOUS
  Administered 2020-09-18: 3 [IU] via SUBCUTANEOUS
  Administered 2020-09-19: 5 [IU] via SUBCUTANEOUS

## 2020-09-12 MED ORDER — INSULIN ASPART 100 UNIT/ML IJ SOLN
5.0000 [IU] | Freq: Three times a day (TID) | INTRAMUSCULAR | Status: DC
  Administered 2020-09-13 (×2): 5 [IU] via SUBCUTANEOUS

## 2020-09-12 MED ORDER — ASPIRIN EC 325 MG PO TBEC
325.0000 mg | DELAYED_RELEASE_TABLET | Freq: Every day | ORAL | Status: DC
  Administered 2020-09-12 – 2020-09-13 (×2): 325 mg via ORAL
  Filled 2020-09-12 (×2): qty 1

## 2020-09-12 MED ORDER — DEXTROSE 50 % IV SOLN: 0.0000 mL | INTRAVENOUS | Status: DC | PRN

## 2020-09-12 MED ORDER — STROKE: EARLY STAGES OF RECOVERY BOOK
Freq: Once | Status: DC
  Filled 2020-09-12: qty 1

## 2020-09-12 MED ORDER — ASPIRIN 300 MG RE SUPP: 300.0000 mg | Freq: Every day | RECTAL | Status: DC

## 2020-09-12 MED ORDER — LEVETIRACETAM 500 MG PO TABS
1000.0000 mg | ORAL_TABLET | Freq: Two times a day (BID) | ORAL | Status: DC
  Administered 2020-09-12: 1000 mg via ORAL
  Filled 2020-09-12: qty 2

## 2020-09-12 MED ORDER — ACETAMINOPHEN 160 MG/5ML PO SOLN: 650.0000 mg | ORAL | Status: DC | PRN

## 2020-09-12 MED ORDER — ACETAMINOPHEN 650 MG RE SUPP: 650.0000 mg | RECTAL | Status: DC | PRN

## 2020-09-12 MED ORDER — ATORVASTATIN CALCIUM 80 MG PO TABS
80.0000 mg | ORAL_TABLET | Freq: Every day | ORAL | Status: DC
  Administered 2020-09-12 – 2020-09-19 (×8): 80 mg via ORAL
  Filled 2020-09-12 (×3): qty 1
  Filled 2020-09-12: qty 2
  Filled 2020-09-12 (×4): qty 1

## 2020-09-12 MED ORDER — METOPROLOL TARTRATE 25 MG PO TABS
25.0000 mg | ORAL_TABLET | Freq: Two times a day (BID) | ORAL | Status: DC
  Administered 2020-09-12 – 2020-09-13 (×2): 25 mg via ORAL
  Filled 2020-09-12 (×2): qty 1

## 2020-09-12 MED ORDER — LACTATED RINGERS IV SOLN: INTRAVENOUS | Status: DC

## 2020-09-12 MED ORDER — INSULIN REGULAR(HUMAN) IN NACL 100-0.9 UT/100ML-% IV SOLN
INTRAVENOUS | Status: AC
  Administered 2020-09-12: 5.5 [IU]/h via INTRAVENOUS

## 2020-09-12 MED ORDER — POTASSIUM CHLORIDE IN NACL 20-0.9 MEQ/L-% IV SOLN
INTRAVENOUS | Status: DC
  Filled 2020-09-12: qty 1000

## 2020-09-12 MED ORDER — INSULIN ASPART 100 UNIT/ML IJ SOLN
3.0000 [IU] | Freq: Three times a day (TID) | INTRAMUSCULAR | Status: DC
  Administered 2020-09-12: 3 [IU] via SUBCUTANEOUS

## 2020-09-12 MED ORDER — DEXTROSE IN LACTATED RINGERS 5 % IV SOLN: INTRAVENOUS | Status: DC

## 2020-09-12 MED ORDER — LEVETIRACETAM IN NACL 500 MG/100ML IV SOLN
500.0000 mg | Freq: Two times a day (BID) | INTRAVENOUS | Status: DC
  Administered 2020-09-12: 500 mg via INTRAVENOUS
  Filled 2020-09-12 (×2): qty 100

## 2020-09-12 MED ORDER — INSULIN DETEMIR 100 UNIT/ML ~~LOC~~ SOLN
10.0000 [IU] | Freq: Two times a day (BID) | SUBCUTANEOUS | Status: DC
  Administered 2020-09-12: 10 [IU] via SUBCUTANEOUS
  Filled 2020-09-12 (×2): qty 0.1

## 2020-09-12 MED ORDER — ACETAMINOPHEN 325 MG PO TABS: 650.0000 mg | ORAL_TABLET | ORAL | Status: DC | PRN

## 2020-09-12 MED ORDER — INSULIN DETEMIR 100 UNIT/ML ~~LOC~~ SOLN
25.0000 [IU] | Freq: Two times a day (BID) | SUBCUTANEOUS | Status: DC
  Administered 2020-09-12 – 2020-09-13 (×2): 25 [IU] via SUBCUTANEOUS
  Filled 2020-09-12 (×4): qty 0.25

## 2020-09-12 MED ORDER — INSULIN ASPART 100 UNIT/ML IJ SOLN: 0.0000 [IU] | Freq: Every day | INTRAMUSCULAR | Status: DC

## 2020-09-12 NOTE — Progress Notes (Signed)
PROGRESS NOTE  Jason Stevenson HUT:[id number] DOB: 1986/01/19   PCP: Marcine Matar, MD  Patient is from: Home.  DOA: 09/11/2020 LOS: 0  Chief complaints: Tingling in the left arm, left face and blank stare  Brief Narrative / Interim history: [name] with PMH of systolic CHF, IDDM-2, HTN, HLD, COVID-19 infection in 07/2020 and noncompliance with medication presenting with tingling in left arm, left face, drooling and blank stare for about 1 week, and admitted for possible seizure, HHS and possible CVA.  In ED, he had left-sided neurologic deficit with dysarthria.  Blood glucose elevated to 797 but not acidotic.  Serum osmolality 323.  Lactic acid 2.2.  CT head with probable subacute infarct in right frontal lobe and old right parietal infarct.  Reportedly, patient's neurologic deficits resolved in ED, and reconsult shifted to possible seizure versus stroke.  He was loaded with Keppra and started on insulin drip.  MRI and EEG ordered.  EEG normal without seizure or epileptiform discharge.  MRI brain with probable hemorrhagic infarct involving the anterior right frontal lobe and left parietal occipital cortex with additional chronic right parietal infarct, right frontotemporal region enhancement suggesting changes related to acute seizure, possible toxic insult involving the caudate and lentiform nuclei bilaterally.  Cardiology recommended interval follow-up brain MRI.  CTA head and neck without significant finding.  TTE pending.  Neurology following.  In regards to hyperglycemic crisis, transitioned to subcu insulin    Subjective: Seen and examined earlier this morning.  No major events overnight of this morning.  Continues to endorse tingling in left face, left arm below his elbow and left leg below his knee.  He denies headache, vision change, chest pain, shortness of breath, GI or UTI symptoms.  He has drooling as well.  Patient's wife at bedside.   Objective: Vitals:   09/12/20  0900 09/12/20 1020 09/12/20 1025 09/12/20 1200  BP: 107/84 (!) 128/99  (!) 118/94  Pulse: (!) 105  (!) 110 (!) 121  Resp: 18 16 17  (!) 21  Temp:      TempSrc:      SpO2: 97%  96% 98%    Intake/Output Summary (Last 24 hours) at 09/12/2020 1320 Last data filed at 09/12/2020 1055 Gross per 24 hour  Intake 2007.99 ml  Output --  Net 2007.99 ml   There were no vitals filed for this visit.  Examination:  GENERAL: No apparent distress.  Nontoxic. HEENT: MMM.  Vision and hearing grossly intact.  Drooling NECK: Supple.  No apparent JVD.  RESP: On RA.  No IWOB.  Fair aeration bilaterally. CVS:  RRR. Heart sounds normal.  ABD/GI/GU: BS+. Abd soft, NTND.  MSK/EXT:  Moves extremities. No apparent deformity. No edema.  SKIN: no apparent skin lesion or wound NEURO: Awake, alert and oriented appropriately.  Mild dysarthria and left facial droop.  PERRL.  EOMI.  Motor 4/5 in LUE but 5/5 elsewhere.  He describes his light touch sensation as tingling in left arm and left leg.  Finger-to-nose intact.  Very mild pronator drift of the left PSYCH: Calm. Normal affect.   Procedures:  None  Microbiology summarized: COVID-19 PCR negative.  Assessment & Plan: Seizure-like activity-reportedly has had a blank stare prior to presentation concerning for seizure activity.  EEG negative.  MRI raising concern for probable hemorrhagic CVA and other findings to suggest seizure.  He was also in hyperglycemic crisis. 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  Noted to have some dysarthria.  Has some residual motor and sensory deficit on the left this morning.  CT and MRI with probable subacute hemorrhagic infarct in right frontal and left parietal occipital areas which goes along with his symptoms and exam.  CTA head and neck basically negative.  He is noncompliant.  A lot of  risk factors.  LDL is 305. -Neurology following-on full dose aspirin -Start high intensity statin -Follow echocardiogram -Permissive hypertension? -Appreciate PT/OT/SLP eval   Uncontrolled IDDM-2 with HHS and hyperlipidemia: A1c> 15.5% in 11/2019.  Not compliant with his medications. Recent Labs  Lab 09/12/20 0414 09/12/20 0600 09/12/20 0722 09/12/20 0912 09/12/20 1020  GLUCAP 207* 194* 171* 163* 115*  -Follow hemoglobin A1c -Transition to subcu insulin -Started high intensity statin -Monitor CBG and adjust insulin as appropriate   Chronic systolic CHF: TTE in 10/2018 with LVEF of 15 to 20%.  He has not had subsequent TTE or follow-up.  Supposed to be on Coreg and Entresto but not compliant.  He is not on diuretics.  Appears euvolemic on exam. -Discontinue IV fluid -Closely monitor fluid status -Follow echocardiogram -Hold GDMT pending neurology recommendation about BP goal  Essential hypertension/sinus tachycardia: BP was elevated on arrival but normalized without medications.  HR in 110s to 120s -Hold home cardiac meds -Will restart low-dose beta-blocker for tachycardia if no indication for permissive hypertension.  Noncompliance -Counseled on this  History of COVID-19 infection -COVID-19 PCR negative now  There is no height or weight on file to calculate BMI.         DVT prophylaxis:  Place and maintain sequential compression device Start: 09/12/20 0141  Code Status: Full code Family Communication: Updated patient's wife at bedside and patient's sister over the phone Level of care: Progressive Status is: Observation  The patient will require care spanning > 2 midnights and should be moved to inpatient because: Ongoing diagnostic testing needed not appropriate for outpatient work up, IV treatments appropriate due to intensity of illness or inability to take PO and Inpatient level of care appropriate due to severity of illness  Dispo: The patient is from: Home               Anticipated d/c is to: To be determined              Patient currently is not medically stable to d/c.   Difficult to place patient No       Consultants:  Neurology   Sch Meds:  Scheduled Meds: .  stroke: mapping our early stages of recovery book   Does not apply Once  . aspirin EC  325 mg Oral Daily   Or  . aspirin  300 mg Rectal Daily  . insulin aspart  0-15 Units Subcutaneous TID WC  . insulin aspart  0-5 Units Subcutaneous QHS  . insulin aspart  3 Units Subcutaneous TID WC  . insulin detemir  10 Units Subcutaneous BID   Continuous Infusions: . 0.9 % NaCl with KCl 20 mEq / L    . dextrose 5% lactated ringers Stopped (09/12/20 1023)  . insulin Stopped (09/12/20 1022)  . lactated ringers 125 mL/hr at 09/12/20 0025  . levETIRAcetam Stopped (09/12/20 1055)   PRN Meds:.acetaminophen **OR** acetaminophen (TYLENOL) oral liquid 160 mg/5 mL **OR** acetaminophen, dextrose  Antimicrobials: Anti-infectives (From admission, onward)   None       I have personally reviewed the following labs and images: CBC: Recent  Labs  Lab 09/11/20 2019 09/11/20 2037  WBC 5.2  --   HGB 14.5 15.0  HCT 42.0 44.0  MCV 83.0  --   PLT 411*  --    BMP &GFR Recent Labs  Lab 09/11/20 2019 09/11/20 2037 09/11/20 2039 09/11/20 2159 09/12/20 0201 09/12/20 1035  NA 127* 130*  --  128* 135 139  K 4.8 4.9  --  4.9 3.9 3.7  CL 88*  --   --  90* 98 101  CO2 26  --   --  27 26 28   GLUCOSE 797*  --   --  707* 340* 119*  BUN 15  --   --  15 13 12   CREATININE 1.18  --   --  1.14 0.90 0.91  CALCIUM 9.6  --   --  9.4 9.6 9.7  MG  --   --  2.3  --   --   --    CrCl cannot be calculated (Unknown ideal weight.). Liver & Pancreas: Recent Labs  Lab 09/11/20 2019  AST 19  ALT 17  ALKPHOS 70  BILITOT 0.7  PROT 8.3*  ALBUMIN 3.8   No results for input(s): LIPASE, AMYLASE in the last 168 hours. No results for input(s): AMMONIA in the last 168 hours. Diabetic: No results for  input(s): HGBA1C in the last 72 hours. Recent Labs  Lab 09/12/20 0414 09/12/20 0600 09/12/20 0722 09/12/20 0912 09/12/20 1020  GLUCAP 207* 194* 171* 163* 115*   Cardiac Enzymes: Recent Labs  Lab 09/11/20 2039  CKTOTAL 67   No results for input(s): PROBNP in the last 8760 hours. Coagulation Profile: No results for input(s): INR, PROTIME in the last 168 hours. Thyroid Function Tests: No results for input(s): TSH, T4TOTAL, FREET4, T3FREE, THYROIDAB in the last 72 hours. Lipid Profile: Recent Labs    09/12/20 0201  CHOL 380*  HDL 45  LDLCALC 305*  TRIG 149  CHOLHDL 8.4   Anemia Panel: No results for input(s): VITAMINB12, FOLATE, FERRITIN, TIBC, IRON, RETICCTPCT in the last 72 hours. Urine analysis:    Component Value Date/Time   COLORURINE STRAW (A) 09/11/2020 2014   APPEARANCEUR CLEAR 09/11/2020 2014   LABSPEC 1.031 (H) 09/11/2020 2014   PHURINE 5.0 09/11/2020 2014   GLUCOSEU >=500 (A) 09/11/2020 2014   HGBUR NEGATIVE 09/11/2020 2014   BILIRUBINUR NEGATIVE 09/11/2020 2014   BILIRUBINUR neg 04/17/2016 1134   KETONESUR 5 (A) 09/11/2020 2014   PROTEINUR NEGATIVE 09/11/2020 2014   UROBILINOGEN 1.0 04/17/2016 1134   UROBILINOGEN 1.0 11/05/2012 1608   NITRITE NEGATIVE 09/11/2020 2014   LEUKOCYTESUR NEGATIVE 09/11/2020 2014   Sepsis Labs: Invalid input(s): PROCALCITONIN, LACTICIDVEN  Microbiology: Recent Results (from the past 240 hour(s))  SARS CORONAVIRUS 2 (TAT 6-24 HRS) Nasopharyngeal Nasopharyngeal Swab     Status: None   Collection Time: 09/12/20 12:57 AM   Specimen: Nasopharyngeal Swab  Result Value Ref Range Status   SARS Coronavirus 2 NEGATIVE NEGATIVE Final    Comment: (NOTE) SARS-CoV-2 target nucleic acids are NOT DETECTED.  The SARS-CoV-2 RNA is generally detectable in upper and lower respiratory specimens during the acute phase of infection. Negative results do not preclude SARS-CoV-2 infection, do not rule out co-infections with other pathogens,  and should not be used as the sole basis for treatment or other patient management decisions. Negative results must be combined with clinical observations, patient history, and epidemiological information. The expected result is Negative.  Fact Sheet for Patients: 09/13/2020  Fact Sheet for Healthcare  Providers: quierodirigir.comhttps://www.fda.gov/media/138095/download  This test is not yet approved or cleared by the [place] FDA and  has been authorized for detection and/or diagnosis of SARS-CoV-2 by FDA under an Emergency Use Authorization (EUA). This EUA will remain  in effect (meaning this test can be used) for the duration of the COVID-19 declaration under Se ction 564(b)(1) of the Act, 21 U.S.C. section 360bbb-3(b)(1), unless the authorization is terminated or revoked sooner.  Performed at [facility], [address] [place], [place] [address]     Radiology Studies: CT ANGIO HEAD NECK W WO CM  Result Date: 09/12/2020 CLINICAL DATA:  Stroke follow-up. EXAM: CT ANGIOGRAPHY HEAD AND NECK TECHNIQUE: Multidetector CT imaging of the head and neck was performed using the standard protocol during bolus administration of intravenous contrast. Multiplanar CT image reconstructions and MIPs were obtained to evaluate the vascular anatomy. Carotid stenosis measurements (when applicable) are obtained utilizing NASCET criteria, using the distal internal carotid diameter as the denominator. CONTRAST:  50mL OMNIPAQUE IOHEXOL 350 MG/ML SOLN COMPARISON:  Head CT and MRI 09/11/2020 FINDINGS: CT HEAD FINDINGS Brain: Hypodensity in the right frontal operculum corresponds to the subacute infarct on MRI. The suspected small subacute left parieto-occipital infarct on MRI is not well demonstrated by CT. A chronic right parietal infarct is again noted. No acute intracranial hemorrhage, mass, midline shift, or extra-axial fluid collection is identified. The ventricles are normal in  size. Vascular: No hyperdense vessel. Skull: No fracture suspicious osseous lesion. Sinuses: Visualized paranasal sinuses and mastoid air cells are clear. Orbits: Visualized portions of the orbits are unremarkable. Review of the MIP images confirms the above findings CTA NECK FINDINGS Aortic arch: Standard 3 vessel aortic arch with widely patent arch vessel origins. Right carotid system: Patent without evidence of stenosis, dissection, or significant atherosclerosis. Left carotid system: Patent without evidence of stenosis, dissection, or significant atherosclerosis. Vertebral arteries: Patent without evidence of stenosis, dissection, or significant atherosclerosis. Strongly dominant right vertebral artery. Skeleton: No acute osseous abnormality or suspicious osseous lesion. Other neck: No evidence of cervical lymphadenopathy or mass. Upper chest: Clear lung apices. Review of the MIP images confirms the above findings CTA HEAD FINDINGS Anterior circulation: The internal carotid arteries are widely patent from skull base to carotid termini. ACAs and MCAs are patent without evidence of a proximal branch occlusion or significant proximal stenosis. There is mild attenuation of distal right MCA branch vessels in the region of the chronic right parietal infarct. No aneurysm is identified. Posterior circulation: The intracranial vertebral arteries are patent to the basilar with the left vertebral artery being markedly hypoplastic distal to the PICA origin. The left PICA and right AICA appear dominant. Patent SCAs are seen bilaterally. The basilar artery is widely patent. There are patent posterior communicating arteries bilaterally. Both PCAs are patent without evidence of a significant proximal stenosis. No aneurysm is identified. Venous sinuses: Patent. Anatomic variants: None. Review of the MIP images confirms the above findings IMPRESSION: No large vessel occlusion or significant proximal stenosis in the head and neck.  Electronically Signed   By: Sebastian AcheAllen  Grady M.D.   On: 09/12/2020 10:42   CT Head Wo Contrast  Result Date: 09/11/2020 CLINICAL DATA:  35 year old male with seizure. EXAM: CT HEAD WITHOUT CONTRAST TECHNIQUE: Contiguous axial images were obtained from the base of the skull through the vertex without intravenous contrast. COMPARISON:  Head CT dated 11/15/2009. FINDINGS: Brain: Focal area of old infarct and encephalomalacia involving the right posterior parietal lobe. Additional area of  infarct in the right frontal lobe may be subacute. Further evaluation with MRI is recommended. The ventricles and sulci appropriate size for patient's age. There is no acute intracranial hemorrhage. No mass effect midline shift no extra-axial fluid collection. Vascular: No hyperdense vessel or unexpected calcification. Skull: Normal. Negative for fracture or focal lesion. Sinuses/Orbits: No acute finding. Other: None IMPRESSION: 1. No acute intracranial hemorrhage. 2. Probable subacute infarct in the right frontal lobe. Further evaluation with MRI is recommended. Old right parietal infarct. Electronically Signed   By: Elgie Collard M.D.   On: 09/11/2020 21:44   MR Brain W and Wo Contrast  Result Date: 09/12/2020 CLINICAL DATA:  Initial evaluation for acute neuro deficit, altered mental status. EXAM: MRI HEAD WITHOUT AND WITH CONTRAST TECHNIQUE: Multiplanar, multiecho pulse sequences of the brain and surrounding structures were obtained without and with intravenous contrast. CONTRAST:  7.25mL GADAVIST GADOBUTROL 1 MMOL/ML IV SOLN COMPARISON:  Prior head CT from earlier the same day. FINDINGS: Brain: Cerebral volume within normal limits for age. Area of encephalomalacia and gliosis involving the right parietal region consistent with a chronic ischemic infarct. Associated mild chronic hemosiderin staining at this location. There is an additional area of abnormal T2/FLAIR signal abnormality involving the cortical and subcortical  aspect of the anterior right frontal lobe. Associated susceptibility artifact consistent with blood products. No visible significant DWI signal. Area does demonstrate heterogeneous postcontrast gyriform diffusion abnormality within the adjacent right frontotemporal region with associated enhancement (series 20, image 6), favored to reflect changes related to superimposed seizure. Possible ischemic change could be conceivably be considered as well. There is an additional area of cortical signal abnormality involving the left parieto-occipital cortex with associated susceptibility artifact and enhancement, favored to reflect an additional subacute ischemic infarct (series 18, image 32). Note made of an additional probable tiny remote right cerebellar infarct (series 10, image 6). Additionally, there is abnormal symmetric increased T2/FLAIR signal abnormality involving the caudate and lentiform nuclei bilaterally (series 11, image 13). Finding is nonspecific, but it is most suggestive of superimposed/concomitant toxic metabolic derangement or other toxic insult. No associated enhancement or hemorrhage. Suspected patchy involvement of the radiating deep white matter tracts (series 11, image 16). No other evidence for acute or subacute ischemia gray-white matter differentiation otherwise maintained. No other mass lesion, mass effect, or midline shift. No hydrocephalus or extra-axial fluid collection. Pituitary gland suprasellar region normal. Midline structures intact and normal. No other definite abnormal enhancement. Note made of an apparent small focus of enhancement within the right cerebellum on coronal postcontrast sequence (series 19, image 5), favored to be vascular in nature. Vascular: Major intracranial vascular flow voids are maintained. Skull and upper cervical spine: Craniocervical junction within normal limits. Bone marrow signal intensity normal. No scalp soft tissue abnormality. Sinuses/Orbits: Globes and  orbital soft tissues within normal limits. Paranasal sinuses are clear. No mastoid effusion. Inner ear structures grossly normal. Other: None. IMPRESSION: 1. Probable subacute hemorrhagic infarcts involving the anterior right frontal lobe and left parieto-occipital cortex as above, with additional chronic right parietal infarct. 2. Superimposed gyriform diffusion abnormality and enhancement involving the right frontotemporal region, favored to reflect changes of acute seizure. Correlation with EEG recommended. Possible ischemic change would be the primary differential consideration. 3. Abnormal symmetric T2/FLAIR signal abnormality involving the caudate and lentiform nuclei bilaterally, nonspecific, but most suggestive of superimposed/concomitant toxic metabolic derangement or other toxic insult. Correlation with history and laboratory values recommended. 4. A short interval follow-up brain MRI to ensure that these changes  resolve is recommended. Electronically Signed   By: Rise Mu M.D.   On: 09/12/2020 00:43   EEG adult  Result Date: 09/12/2020 Charlsie Quest, MD     09/12/2020  8:37 AM Patient Name: Panfilo Ketchum MRN: [id number] Epilepsy Attending: Charlsie Quest Referring Physician/Provider: Dr Eligha Bridegroom Date: 09/12/2020 Duration: 23.58 mins Patient history: 35 year old male presenting with new onset of left sided twitching, left facial droop and severe hyperglycemia. EEG to evaluate for seizure. Level of alertness: Awake, drowsy AEDs during EEG study: None Technical aspects: This EEG study was done with scalp electrodes positioned according to the 10-20 International system of electrode placement. Electrical activity was acquired at a sampling rate of 500Hz  and reviewed with a high frequency filter of 70Hz  and a low frequency filter of 1Hz . EEG data were recorded continuously and digitally stored. Description: The posterior dominant rhythm consists of 8-9 Hz activity of moderate  voltage (25-35 uV) seen predominantly in posterior head regions, symmetric and reactive to eye opening and eye closing. Drowsiness was characterized by attenuation of the posterior background rhythm and roving eye movements.  Hyperventilation and photic stimulation were not performed.   IMPRESSION: This study is within normal limits. No seizures or epileptiform discharges were seen throughout the recording. Priyanka      Kalev Temme T. Nerea B[facility]  If 7PM-7AM, please contact night-coverage [email or web address] 09/12/2020, 1:20 PM

## 2020-09-12 NOTE — Progress Notes (Addendum)
STROKE TEAM PROGRESS NOTE   INTERVAL HISTORY  35 y.o. male with DM2, nonischemic cardiomyopathy, chronic systolic CHF, HLD and HTN who presents to the ED with an 8 day history of intermittent left sided twitching. Wife states that he had a twitching spell that was unusually long today, approximately 30 minutes in duration, which prompted the visit to the ED.  MRI brain revealed Probable subacute hemorrhagic infarcts involving the anterior right frontal lobe and left parieto-occipital cortex. Chronic right parietal infarct. Superimposed gyriform diffusion abnormality and enhancement involving the right frontotemporal region, favored to reflect changes of acute seizure.      I spoke to his sister on the phone and she states that the patient hasn't been taking his medications as prescribed.  He is often unable to refill is medication due to financial constraints.     Vitals:   09/12/20 1200 09/12/20 1300 09/12/20 1417 09/12/20 1507  BP: (!) 118/94 (!) 128/104  119/73  Pulse: (!) 121 (!) 118  73  Resp: (!) 21 16  15   Temp:   (!) 97.2 F (36.2 C)   TempSrc:   Axillary   SpO2: 98% 97%  93%   CBC:  Recent Labs  Lab 09/11/20 2019 09/11/20 2037  WBC 5.2  --   HGB 14.5 15.0  HCT 42.0 44.0  MCV 83.0  --   PLT 411*  --    Basic Metabolic Panel:  Recent Labs  Lab 09/11/20 2039 09/11/20 2159 09/12/20 0201 09/12/20 1035  NA  --    < > 135 139  K  --    < > 3.9 3.7  CL  --    < > 98 101  CO2  --    < > 26 28  GLUCOSE  --    < > 340* 119*  BUN  --    < > 13 12  CREATININE  --    < > 0.90 0.91  CALCIUM  --    < > 9.6 9.7  MG 2.3  --   --   --    < > = values in this interval not displayed.   Lipid Panel:  Recent Labs  Lab 09/12/20 0201  CHOL 380*  TRIG 149  HDL 45  CHOLHDL 8.4  VLDL 30  LDLCALC 09/14/20*   HgbA1c: No results for input(s): HGBA1C in the last 168 hours. Urine Drug Screen:  Recent Labs  Lab 09/12/20 1348  LABOPIA NONE DETECTED  COCAINSCRNUR NONE DETECTED   LABBENZ NONE DETECTED  AMPHETMU NONE DETECTED  THCU NONE DETECTED  LABBARB NONE DETECTED    Alcohol Level No results for input(s): ETH in the last 168 hours.  IMAGING past 24 hours  CT ANGIO HEAD NECK W WO CM Result Date: 09/12/2020 IMPRESSION: No large vessel occlusion or significant proximal stenosis in the head and neck.   CT Head Wo Contrast Result Date: 09/11/2020 IMPRESSION:  1. No acute intracranial hemorrhage.  2. Probable subacute infarct in the right frontal lobe. Further evaluation with MRI is recommended. Old right parietal infarct.   MR Brain W and Wo Contrast Result Date: 09/12/2020 IMPRESSION:  1. Probable subacute hemorrhagic infarcts involving the anterior right frontal lobe and left parieto-occipital cortex as above, with additional chronic right parietal infarct.  2. Superimposed gyriform diffusion abnormality and enhancement involving the right frontotemporal region, favored to reflect changes of acute seizure. Correlation with EEG recommended. Possible ischemic change would be the primary differential consideration.  3. Abnormal symmetric T2/FLAIR  signal abnormality involving the caudate and lentiform nuclei bilaterally, nonspecific, but most suggestive of superimposed/concomitant toxic metabolic derangement or other toxic insult. Correlation with history and laboratory values recommended.  4. A short interval follow-up brain MRI to ensure that these changes resolve is recommended.   EEG adult Result Date: 09/12/2020 IMPRESSION: This study is within normal limits. No seizures or epileptiform discharges were seen throughout the recording. Priyanka Annabelle Harman    PHYSICAL EXAM HEENT-  [place]/AT.   Lungs- Respirations unlabored Cardiovascular- regular rate and rhythm Extremities- No edema  Neurological Examination Mental Status: Awake and alert. Oriented x 5. Speech with mild dysarthria, but fluent. Comprehension and naming intact.  Cranial Nerves: II: visual fields  full PERRL.   III,IV, VI: EOMI without nystagmus.  V,VII: Left sided facial droop, lower quadrant. Decrease temperature on the left   VIII: Hearing appears intact  IX,X: No hypophonia XI: Head is midline XII: Tongue protrudes to the left.  Motor: RUE and RLE 5/5 LUE with subtle 4+/5 weakness of deltoid and biceps, 4/5 hand grip LLE 5/5 Sensory: Temp and light touch intact on the right, diminished slightly on the left. No extinction to DSS.  Deep Tendon Reflexes: 1+ and symmetric throughout Cerebellar: No ataxia with FNF and H-S bilaterally  Gait: Deferred  ASSESSMENT/PLAN Mr. Narada Frees is a 35 y.o. male with history of  DM2, nonischemic cardiomyopathy, chronic systolic CHF, HLD and HTN who presents to the ED with an 8 day history of intermittent left sided twitching, left facial droop, and severe hyperglycemia.   Seizures:  Intermittent left sided twitching x1 week  MRI: Superimposed gyriform diffusion abnormality and enhancement involving the right frontotemporal region, favored to reflect changes of acute seizure.  Repeat MRI in 1 week  EEG: no seizures or epileptiform dischages  Loaded with Keppra 2g x1, followed by 500mg  q 12 hrs  Another episode this pm, increase keppra to 1g bid  LTM EEG pending  Chronic / subacute strokes:  Probable chronic/subacute hemorrhagic infarcts involving the anterior right frontal lobe and left parieto-occipital cortex.   CT head No acute abnormality. probable subacute infarct in the right frontal lobe  CTA head & neck: no large vessel occlusion or significant proximal stenosis in the head and neck.   MRI: probable subacute hemorrhagic infarcts involving the anterior right frontal lobe and left parieto-occipital cortex  2D Echo pending  SARS coronavirus negative  LDL 305  HgbA1c pending  UDS neg  VTE prophylaxis - scds Diet: heart healthy  No antithrombotic prior to admission, now on aspirin 325 mg daily.   Therapy  recommendations:  pending  Disposition:  pending    Chronic Congestive Heart Failure  Last echo (July 2020) EF 15-20%, left ventricular diffuse hypokinesis. No significant change from  May 2017  Takes coreg and entresto 49-51mg  bid, not sure about compliance   2D echo: pending  May need to consult cardiology depending on echo results  Hypertension  Home meds:  Coreg 9.375mg  bid  Stable . SBP goal <160 . Long-term BP goal normotensive  Hyperlipidemia  Home meds:  crestor 40mg  daily, not sure about compliance   LDL 305, goal < 70  Add Lipitor 80 mg daily   High intensity statin   Continue statin at discharge  Diabetes type II Uncontrolled/DKA  Home meds:  Glipizide 10mg  bid, levemir 30 units daily, lispro ss, metformin 500mg  bid - noncompliance  stopped checking his fingerstick glucose readings about 2 weeks ago  blood glucose was > 600 in  the ED  Insulin drip -> off  Diabetes coordinator: recommendations for transition  Levemir 18 units 2 hours prior to discontinuing IV insulin  Novolog 0-15 units TID and 0-5 units QHS  Novolog 3 units TID with meals if eats at least 50%  HgbA1c pending, goal < 7.0  CBGs  SSI  Other Stroke Risk Factors  Prior Marijuana use. Quit 2013. UDS negative  ETOH use, advised to drink no more than 1 drink per day  Family hx stroke (aunt)  Coronary artery disease: nonischemic cardiomyopathy  Congestive heart failure: diagnosed in 06/2015. Last echo (7/20) EF 15-20%, left ventricular diffuse hypokinesis. No significant change from 5/17  Other Active Problems  Chronic pain: takes tylenol, and ibuprofen at home at home  Neuropathy: takes gabapentin  Hospital day # 0  Lissy Olivencia-Simmons, ACNP-BC Stroke NP  ATTENDING NOTE: I reviewed above note and agree with the assessment and plan. Pt was seen and examined.   35 year old male with history of diabetes, cardiomyopathy, CHF, hypertension, hyperlipidemia admitted  for left-sided body twitching for 8 days followed by left facial droop, left arm weakness and slurred speech.  On admission glucose > 600 and patient stopped taking insulin a week ago due to noncompliance.  Per patient and his wife at bedside, for the last 1 week also, patient had episodes of audio aura (muscial tone) followed by left leg, then arm face numbness tingling, with left shoulder twitching and left gaze, staring and dazed eyes.  Lasting from several minutes to 20 minutes.  This afternoon around 5:40 PM, he again had similar episode witnessed by the RN and lasted 4 to 5 minutes per wife.  EEG this morning negative for seizure.  MRI showed right parietal temporal gyriform diffusion abnormality and enhancement, favoring seizure activity.  Will put on long-term EEG and repeat MRI in 1 week.  He was given Keppra 2 g load yesterday, currently on 500 twice daily, will increase to 1 g twice daily.  MRI also showed subacute/chronic right MCA stroke x2, left occipital hemorrhagic stroke, embolic pattern based on location.  Etiology could be due to cardiomyopathy with EF 15 to 20% in 10/2018, however patient does have several uncontrolled risk factors including uncontrolled diabetes and HLD.  Current 2D echo pending.  CTA head and neck unremarkable, UDS negative, LDL 305, A1c pending.  Currently on aspirin 325.  Patient presented to ED with severe elevated blood sugar level, concern for DKA, was treated with insulin drip and now transition to Levemir.  MRI also showed bilateral CR T2 FLAIR changes concerning for metabolic changes.  Will repeat MRI in 1 week.  Hyperglycemia management per primary team.  On exam, wife at bedside, patient awake alert, fully orientated x3.  No aphasia, fluent language, follows some commands, able to name and repeat. No gaze palsy, tracking bilaterally, visual field full, PERRL. No facial droop. Tongue midline. Bilateral UEs 5/5, no drift. Bilaterally LEs 5/5, no drift. Sensation  symmetrical bilaterally, b/l FTN intact, gait not tested. Will follow.  For detailed assessment and plan, please refer to above as I have made changes wherever appropriate.   Marvel Plan, MD PhD Stroke Neurology 09/12/2020 6:50 PM  I discussed with Dr. Alanda Slim. I spent  35 minutes in total face-to-face time with the patient, more than 50% of which was spent in counseling and coordination of care, reviewing test results, images and medication, and discussing the diagnosis, treatment plan and potential prognosis. This patient's care requiresreview of multiple databases, neurological assessment, discussion  with family, other specialists and medical decision making of high complexity. I had long discussion with patient and wife at bedside, updated pt current condition, treatment plan and potential prognosis, and answered all the questions.  They expressed understanding and appreciation.     To contact Stroke Continuity provider, please refer to [email or web address]. After hours, contact General Neurology

## 2020-09-12 NOTE — Progress Notes (Signed)
Inpatient Diabetes Program Recommendations  AACE/ADA: New Consensus Statement on Inpatient Glycemic Control (2015)  Target Ranges:  Prepandial:   less than 140 mg/dL      Peak postprandial:   less than 180 mg/dL (1-2 hours)      Critically ill patients:  140 - 180 mg/dL   Lab Results  Component Value Date   GLUCAP 163 (H) 09/12/2020   HGBA1C >15.5 (H) 11/26/2019    Review of Glycemic Control Results for Jason Stevenson, Jason Stevenson (MRN [id number]) as of 09/12/2020 09:29  Ref. Range 09/12/2020 06:00 09/12/2020 07:22 09/12/2020 09:12  Glucose-Capillary Latest Ref Range: 70 - 99 mg/dL 811 (H) 886 (H) 773 (H)   Diabetes history:  DM2 Outpatient Diabetes medications:  Levemir 30 units daily Humalog 10-20 units TID  Glipizide 10 mg BID Metformin 500 mg BID Current orders for Inpatient glycemic control:  IV insulin  Inpatient Diabetes Program Recommendations:     When MD is ready to transition to SQ insulin, please consider:  -Levemir 18 units 2 hours prior to discontinuing IV insulin -Novolog 0-15 units TID and 0-5 units QHS -Novolog 3 units TID with meals if eats at least 50%  Will speak with patient and spouse today.  Will continue to follow while inpatient.  Thank you, Dulce Sellar, RN, BSN Diabetes Coordinator Inpatient Diabetes Program [phone] (team pager from 8a-5p)

## 2020-09-12 NOTE — Assessment & Plan Note (Signed)
Stay well hydrated  Stay active  Deep breathing exercises  May start vitamin C daily, vitamin D3 daily, Zinc daily  May take tylenol or fever or pain  Olfactory retraining - for taste and smell  Magnesium - 600 mg daily - for headache     Follow up:  Follow up if needed  Will need follow up with PCP in 1 month

## 2020-09-12 NOTE — ED Notes (Signed)
Pt stated left arm started to tingle again, RN notified.

## 2020-09-12 NOTE — Progress Notes (Signed)
Inpatient Diabetes Program Recommendations  AACE/ADA: New Consensus Statement on Inpatient Glycemic Control (2015)  Target Ranges:  Prepandial:   less than 140 mg/dL      Peak postprandial:   less than 180 mg/dL (1-2 hours)      Critically ill patients:  140 - 180 mg/dL   Lab Results  Component Value Date   GLUCAP 115 (H) 09/12/2020   HGBA1C >15.5 (H) 11/26/2019    Review of Glycemic Control Results for Jason Stevenson, Jason Stevenson (MRN [id number]) as of 09/12/2020 15:29  Ref. Range 09/12/2020 04:14 09/12/2020 06:00 09/12/2020 07:22 09/12/2020 09:12 09/12/2020 10:20  Glucose-Capillary Latest Ref Range: 70 - 99 mg/dL 387 (H) 564 (H) 332 (H) 163 (H) 115 (H)   Diabetes history:  DM2 Outpatient Diabetes medications:  Levemir 30 units daily Humalog 10-20 units TID  Glipizide 10 mg BID Metformin 500 mg BID Current orders for Inpatient glycemic control:  Levemir 10 units BID Novolog 0-15 units TID  Novolog 3 units TID  Spoke with patient and wife at bedside.  He States he has not taken his insulins is over 1 month because he is overwhelmed with so many injections, finger sticks and DM management.  He has had weight loss, polydipsia and poly uria.  His last A1C was > 15% on August of last year.  Current A1C is pending.  He is uninsured and cannot always afford his insulin at [facility].  His mom gives him some of her insulin.   Discussed basic DM pathophysiology and importance of good glucose control.  Explained long and short term complications of uncontrolled glucose.  He would like  A CGM but since he is uninsured this would not be possible financially.  Wife states they are working on Museum/gallery curator.  He has a functioning glucometer at home.  Novolin 70/30 would likely be best for him until he is insured.  They had not heard of 70/30 before and stated they can afford a box of 5 pens for $43.  Explained 70/30 and how it works and when to administer.  Discussed hypoglycemia, signs, symptoms and treatment.   Ordered LWWD booklet.  He states he is going to do better and administer his insulin as prescribed.    Might consider 70/30 14 units BID starting 09/13/20 24 hours after Lantus administration at 1700.  (20 units basal daily & 4 units meal coverage BID)  Will continue to follow while inpatient.  Thank you, Dulce Sellar, RN, BSN Diabetes Coordinator Inpatient Diabetes Program [phone] (team pager from 8a-5p)

## 2020-09-12 NOTE — ED Notes (Signed)
Patient transported to CT 

## 2020-09-12 NOTE — Progress Notes (Addendum)
Occupational Therapy Evaluation Patient Details Name: Jason Stevenson MRN: [id number] DOB: [birth year] Today's Date: 09/12/2020    History of Present Illness Pt is a 35 y/o male admitted secondary to increased L sided twitching and tingling. Workup for possible seizure. MRI+ subacute hemorrhagic infarcts involving the anterior  right frontal lobe and left parieto-occipital cortex as above, with  additional chronic right parietal infarct. Pt also found to be in Hyperosmolar hyperglycemic state. PMH includes HTN, DM, CHF, nonischemic cardiomyopathy.   Clinical Impression   PTA pt lives independently with his wife. Pt has not worked since December for "health reasons" and has not "felt normal" since having Covid in April, 2022. 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 Wife states pt has had more difficulty "paying attention" and has difficulty "keeping up with conversations". Pt demonstrates apparent deficits with executive level cognition in addition to perceptual deficits. During session, pt became tachy with HR sustaining @ 175, with complaints of "tingling" L side, increased production of saliva, "tumping noise" in L ear and questionable incontinence of urine - MD made aware. Will follow acutely however recommend follow up with OT at the neuro outpt center to maximize functional level of independence with IADL tasks and facilitate ability to return to work.     Follow Up Recommendations  Outpatient OT;Other (comment) (neuro outpt OT; S with IADL tasks)    Equipment Recommendations  None recommended by OT    Recommendations for Other Services Speech consult (cognitive eval)     Precautions / Restrictions Precautions Precautions: Fall Precaution Comments: Watch HR Restrictions Weight Bearing Restrictions: No      Mobility Bed Mobility Overal bed mobility: Modified Independent                  Transfers                 General transfer comment: not assessed due to  HR    Balance                                           ADL either performed or assessed with clinical judgement   ADL Overall ADL's : Needs assistance/impaired Eating/Feeding: Modified independent   Grooming: Set up;Sitting   Upper Body Bathing: Set up;Supervision/ safety;Sitting   Lower Body Bathing: Supervison/ safety;Set up;Sit to/from stand   Upper Body Dressing : Set up;Supervision/safety;Sitting   Lower Body Dressing: Supervision/safety;Set up;Sit to/from stand               Functional mobility during ADLs:  (not addressed due to HR sustained @ 175)       Vision Baseline Vision/History: Wears glasses Wears Glasses:  ("suppose to wear them") Patient Visual Report: No change from baseline Vision Assessment?: Yes Eye Alignment: Within Functional Limits Ocular Range of Motion: Within Functional Limits Alignment/Gaze Preference: Within Defined Limits Tracking/Visual Pursuits: Decreased smoothness of horizontal tracking;Decreased smoothness of vertical tracking Saccades: Within functional limits Convergence: Within functional limits Visual Fields: Other (comment) (will further assess) Additional Comments: decreased visual attention noted     Perception Perception Perception Tested?: Yes Spatial deficits: difficulty with correct spacing on clock draw; increased time for processing; difficulty shifting attention; repeated "6" Comments: will further assess   Praxis Praxis Praxis-Other Comments: appears intact    Pertinent Vitals/Pain Pain Assessment: Faces Faces Pain Scale: Hurts little more Pain Location: R and L leg Pain Descriptors /  Indicators: Tingling Pain Intervention(s): Limited activity within patient's tolerance     Hand Dominance Right   Extremity/Trunk Assessment Upper Extremity Assessment Upper Extremity Assessment: LUE deficits/detail LUE Deficits / Details: reports tingling; using functionally. will further assess    Lower Extremity Assessment LLE Deficits / Details: Reports increased pain and tingling throughout LLE.   Cervical / Trunk Assessment Cervical / Trunk Assessment: Normal   Communication Communication Communication: No difficulties   Cognition Arousal/Alertness: Awake/alert Behavior During Therapy: Impulsive Overall Cognitive Status: Impaired/Different from baseline Area of Impairment: Problem solving;Memory;Attention;Safety/judgement;Awareness                   Current Attention Level: Selective Memory: Decreased short-term memory   Safety/Judgement: Decreased awareness of safety;Decreased awareness of deficits Awareness: Emergent Problem Solving: Slow processing;Decreased initiation General Comments: Increased time required for processing. Attention deficits noted. Wife states he has had more difficulty keeping up with medication. Wife staes she will be talking about a subject and he has "problems talking about that and keeps talking about things they've already talked about"..."he gets stuck"   General Comments  Wife present and very supportive    Exercises     Shoulder Instructions      Home Living Family/patient expects to be discharged to:: Private residence Living Arrangements: Spouse/significant other Available Help at Discharge: Family Type of Home: House Home Access: Stairs to enter Secretary/administrator of Steps: 3 Entrance Stairs-Rails: None Home Layout: One level     Bathroom Shower/Tub: Chief Strategy Officer: Standard Bathroom Accessibility: No   Home Equipment: None          Prior Functioning/Environment Level of Independence: Independent        Comments: not working currently because of "medical issues"; previously worked in Primary school teacher; computer work        OT Problem List: Decreased activity tolerance;Impaired vision/perception;Decreased cognition;Decreased safety awareness;Decreased knowledge of use of DME or  AE;Cardiopulmonary status limiting activity;Impaired sensation;Pain      OT Treatment/Interventions: Self-care/ADL training;Therapeutic exercise;Neuromuscular education;DME and/or AE instruction;Therapeutic activities;Cognitive remediation/compensation;Visual/perceptual remediation/compensation;Patient/family education;Balance training    OT Goals(Current goals can be found in the care plan section) Acute Rehab OT Goals Patient Stated Goal: to be normal again OT Goal Formulation: With patient Time For Goal Achievement: 09/26/20 Potential to Achieve Goals: Good  OT Frequency: Min 2X/week   Barriers to D/C:            Co-evaluation              AM-PAC OT "6 Clicks" Daily Activity     Outcome Measure Help from another person eating meals?: None Help from another person taking care of personal grooming?: A Little Help from another person toileting, which includes using toliet, bedpan, or urinal?: A Little Help from another person bathing (including washing, rinsing, drying)?: A Little Help from another person to put on and taking off regular upper body clothing?: A Little Help from another person to put on and taking off regular lower body clothing?: A Little 6 Click Score: 19   End of Session Nurse Communication: Other (comment) (Nsg transporting pt to floor)  Activity Tolerance: Treatment limited secondary to medical complications (Comment) (tachy) Patient left: Other (comment);in bed;with call bell/phone within reach;with family/visitor present (stretcher)  OT Visit Diagnosis: Unsteadiness on feet (R26.81);Other symptoms and signs involving the nervous system (R29.898);Other symptoms and signs involving cognitive function;Pain Pain - Right/Left: Left Pain - part of body:  (tingling/stinging LU/Le adn face)  Time: 1410-1430 OT Time Calculation (min): 20 min Charges:  OT General Charges $OT Visit: 1 Visit OT Evaluation $OT Eval Moderate Complexity: 1  Mod  Amylynn Fano, OT/L   Acute OT Clinical Specialist Acute Rehabilitation Services Pager [phone] Office [phone]   [facility] 09/12/2020, 3:41 PM

## 2020-09-12 NOTE — Evaluation (Signed)
Physical Therapy Evaluation Patient Details Name: Jason Stevenson MRN: [id number] DOB: Mar 08, 1986 Today's Date: 09/12/2020   History of Present Illness  Pt is a 35 y/o male admitted secondary to increased L sided twitching and tingling. Workup for possible seizure. 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 A right parietal infarct is also noted, appearing chronic in age. Pt also found to be in Hyperosmolar hyperglycemic state. PMH includes HTN, DM, CHF, nonischemic cardiomyopathy.  Clinical Impression  Pt admitted secondary to problem above with deficits below. Pt reporting increased tingling and pain in LUE and LLE. Reports not as intense as it was. Did not report or notice twitching during session. Requiring min guard A for transfers this session. HR elevated to mid 130s; notified RN. Anticipate pt will progress well and will not require follow up PT. Will continue to follow acutely to maximize functional mobility independence and safety.     Follow Up Recommendations No PT follow up    Equipment Recommendations  None recommended by PT    Recommendations for Other Services       Precautions / Restrictions Precautions Precautions: Fall Precaution Comments: Watch HR Restrictions Weight Bearing Restrictions: No      Mobility  Bed Mobility Overal bed mobility: Modified Independent                  Transfers Overall transfer level: Needs assistance Equipment used: None Transfers: Sit to/from [employer] Sit to Stand: Min guard Stand pivot transfers: Min guard       General transfer comment: Min guard for safety to stand and transfer to<>from BSC. No overt LOB noted. HR elevating to mid 130s during transfer; RN notified.  Ambulation/Gait                Stairs            Wheelchair Mobility    Modified Rankin (Stroke Patients Only) Modified Rankin (Stroke Patients Only) Pre-Morbid Rankin  Score: No symptoms Modified Rankin: Moderately severe disability     Balance Overall balance assessment: Mild deficits observed, not formally tested                                           Pertinent Vitals/Pain Pain Assessment: Faces Faces Pain Scale: Hurts little more Pain Location: R and L leg Pain Descriptors / Indicators: Tingling Pain Intervention(s): Monitored during session;Limited activity within patient's tolerance;Repositioned    Home Living Family/patient expects to be discharged to:: Private residence Living Arrangements: Spouse/significant other Available Help at Discharge: Family Type of Home: House Home Access: Stairs to enter Entrance Stairs-Rails: None Secretary/administrator of Steps: 3 Home Layout: One level Home Equipment: None      Prior Function Level of Independence: Independent               Hand Dominance        Extremity/Trunk Assessment   Upper Extremity Assessment Upper Extremity Assessment: Defer to OT evaluation    Lower Extremity Assessment Lower Extremity Assessment: LLE deficits/detail LLE Deficits / Details: Reports increased pain and tingling throughout LLE.    Cervical / Trunk Assessment Cervical / Trunk Assessment: Normal  Communication   Communication: No difficulties  Cognition Arousal/Alertness: Awake/alert Behavior During Therapy: WFL for tasks assessed/performed Overall Cognitive Status: Impaired/Different from baseline Area of Impairment: Problem solving  Problem Solving: Slow processing General Comments: Increased time required for processing. May be close to baseline      General Comments General comments (skin integrity, edema, etc.): Pt's wife present during session    Exercises     Assessment/Plan    PT Assessment Patient needs continued PT services  PT Problem List Decreased balance;Decreased mobility;Decreased activity  tolerance;Decreased cognition;Impaired sensation       PT Treatment Interventions DME instruction;Gait training;Stair training;Functional mobility training;Therapeutic activities;Balance training;Therapeutic exercise;Neuromuscular re-education;Patient/family education    PT Goals (Current goals can be found in the Care Plan section)  Acute Rehab PT Goals Patient Stated Goal: to figure out what is going on PT Goal Formulation: With patient Time For Goal Achievement: 09/26/20 Potential to Achieve Goals: Good    Frequency Min 3X/week   Barriers to discharge        Co-evaluation               AM-PAC PT "6 Clicks" Mobility  Outcome Measure Help needed turning from your back to your side while in a flat bed without using bedrails?: None Help needed moving from lying on your back to sitting on the side of a flat bed without using bedrails?: None Help needed moving to and from a bed to a chair (including a wheelchair)?: A Little Help needed standing up from a chair using your arms (e.g., wheelchair or bedside chair)?: A Little Help needed to walk in hospital room?: A Little Help needed climbing 3-5 steps with a railing? : A Little 6 Click Score: 20    End of Session Equipment Utilized During Treatment: Gait belt Activity Tolerance: Treatment limited secondary to medical complications (Comment) (elevated HR) Patient left: in bed;with call bell/phone within reach;Other (comment) (on stretcher in ED with transport staff present) Nurse Communication: Mobility status;Other (comment) (elevated HR) PT Visit Diagnosis: Other abnormalities of gait and mobility (R26.89)    Time: 4818-5631 PT Time Calculation (min) (ACUTE ONLY): 23 min   Charges:   PT Evaluation $PT Eval Moderate Complexity: 1 Mod PT Treatments $Therapeutic Activity: 8-22 mins        Cindee Salt, DPT  Acute Rehabilitation Services  Pager: [phone] Office: [phone]   Lehman Prom 09/12/2020, 10:54 AM

## 2020-09-12 NOTE — ED Notes (Addendum)
Pt and spouse notified RN that Pt was starting to have tingling in left face and hand. States that this is how his episodes usually start.   Provider paged.  Staff told pt and spouse to inform staff of any further symptoms or sensations changes

## 2020-09-12 NOTE — Procedures (Signed)
Patient Name: Jason Stevenson  MRN: [id number]  Epilepsy Attending: Charlsie Quest  Referring Physician/Provider: Dr Eligha Bridegroom Date: 09/12/2020 Duration: 23.58 mins  Patient history: 35 year old male presenting with new onset of left sided twitching, left facial droop and severe hyperglycemia. EEG to evaluate for seizure.   Level of alertness: Awake, drowsy  AEDs during EEG study: None  Technical aspects: This EEG study was done with scalp electrodes positioned according to the 10-20 International system of electrode placement. Electrical activity was acquired at a sampling rate of 500Hz  and reviewed with a high frequency filter of 70Hz  and a low frequency filter of 1Hz . EEG data were recorded continuously and digitally stored.   Description: The posterior dominant rhythm consists of 8-9 Hz activity of moderate voltage (25-35 uV) seen predominantly in posterior head regions, symmetric and reactive to eye opening and eye closing. Drowsiness was characterized by attenuation of the posterior background rhythm and roving eye movements.   Hyperventilation and photic stimulation were not performed.     IMPRESSION: This study is within normal limits. No seizures or epileptiform discharges were seen throughout the recording.  Alcides Nutting 

## 2020-09-12 NOTE — ED Notes (Signed)
Secretary to order room tray.  

## 2020-09-12 NOTE — ED Notes (Signed)
Pt back in the room

## 2020-09-12 NOTE — ED Notes (Signed)
EEG tech at bedside. 

## 2020-09-12 NOTE — Progress Notes (Signed)
EEG complete - results pending 

## 2020-09-12 NOTE — Telephone Encounter (Signed)
It looks like the patient was recently diagnosed with CVA in the ED. Could you set up a virtual follow- up for this?

## 2020-09-12 NOTE — Progress Notes (Signed)
Called to pt room to observe him with a primarily fixed stare, leftward gaze. Slowly mumbled verbal response to questions about current location. Also followed commands to squeeze hands and plantar flex both feet. No salivation, no incontinence, no clonus.

## 2020-09-13 ENCOUNTER — Inpatient Hospital Stay (HOSPITAL_COMMUNITY): Payer: Medicaid Other

## 2020-09-13 DIAGNOSIS — E785 Hyperlipidemia, unspecified: Secondary | ICD-10-CM

## 2020-09-13 DIAGNOSIS — I513 Intracardiac thrombosis, not elsewhere classified: Secondary | ICD-10-CM | POA: Diagnosis present

## 2020-09-13 DIAGNOSIS — I5022 Chronic systolic (congestive) heart failure: Secondary | ICD-10-CM

## 2020-09-13 DIAGNOSIS — I639 Cerebral infarction, unspecified: Secondary | ICD-10-CM

## 2020-09-13 DIAGNOSIS — Z9119 Patient's noncompliance with other medical treatment and regimen: Secondary | ICD-10-CM

## 2020-09-13 DIAGNOSIS — R569 Unspecified convulsions: Secondary | ICD-10-CM

## 2020-09-13 DIAGNOSIS — I6389 Other cerebral infarction: Secondary | ICD-10-CM

## 2020-09-13 DIAGNOSIS — E119 Type 2 diabetes mellitus without complications: Secondary | ICD-10-CM

## 2020-09-13 LAB — GLUCOSE, CAPILLARY
Glucose-Capillary: 206 mg/dL — ABNORMAL HIGH (ref 70–99)
Glucose-Capillary: 181 mg/dL — ABNORMAL HIGH (ref 70–99)
Glucose-Capillary: 234 mg/dL — ABNORMAL HIGH (ref 70–99)
Glucose-Capillary: 188 mg/dL — ABNORMAL HIGH (ref 70–99)
Glucose-Capillary: 86 mg/dL (ref 70–99)

## 2020-09-13 LAB — ECHOCARDIOGRAM COMPLETE
Area-P 1/2: 5.6 cm2
Calc EF: 12.1 %
S' Lateral: 5.5 cm
Single Plane A2C EF: 18.8 %
Single Plane A4C EF: 7.4 %

## 2020-09-13 LAB — RENAL FUNCTION PANEL
Albumin: 3.1 g/dL — ABNORMAL LOW (ref 3.5–5.0)
Anion gap: 13 (ref 5–15)
BUN: 15 mg/dL (ref 6–20)
CO2: 25 mmol/L (ref 22–32)
Calcium: 9.3 mg/dL (ref 8.9–10.3)
Chloride: 94 mmol/L — ABNORMAL LOW (ref 98–111)
Creatinine, Ser: 0.87 mg/dL (ref 0.61–1.24)
GFR, Estimated: >60 mL/min (ref >60)
Glucose, Bld: 186 mg/dL — ABNORMAL HIGH (ref 70–99)
Phosphorus: 4.3 mg/dL (ref 2.5–4.6)
Potassium: 4 mmol/L (ref 3.5–5.1)
Sodium: 132 mmol/L — ABNORMAL LOW (ref 135–145)

## 2020-09-13 LAB — HEMOGLOBIN A1C
Hgb A1c MFr Bld: >15.5 % — ABNORMAL HIGH (ref 4.8–5.6)
Mean Plasma Glucose: >398 mg/dL

## 2020-09-13 LAB — HEPARIN LEVEL (UNFRACTIONATED): Heparin Unfractionated: 0.11 IU/mL — ABNORMAL LOW (ref 0.30–0.70)

## 2020-09-13 LAB — PHENYTOIN LEVEL, TOTAL: Phenytoin Lvl: <2.5 ug/mL — ABNORMAL LOW (ref 10.0–20.0)

## 2020-09-13 LAB — MAGNESIUM: Magnesium: 1.8 mg/dL (ref 1.7–2.4)

## 2020-09-13 MED ORDER — LEVETIRACETAM 500 MG PO TABS
1000.0000 mg | ORAL_TABLET | Freq: Two times a day (BID) | ORAL | Status: DC
  Administered 2020-09-13 – 2020-09-16 (×6): 1000 mg via ORAL
  Filled 2020-09-13 (×6): qty 2

## 2020-09-13 MED ORDER — PHENYTOIN SODIUM EXTENDED 100 MG PO CAPS
100.0000 mg | ORAL_CAPSULE | Freq: Three times a day (TID) | ORAL | Status: DC
  Administered 2020-09-13 – 2020-09-19 (×18): 100 mg via ORAL
  Filled 2020-09-13 (×19): qty 1

## 2020-09-13 MED ORDER — WARFARIN SODIUM 5 MG PO TABS
5.0000 mg | ORAL_TABLET | Freq: Once | ORAL | Status: AC
  Administered 2020-09-13: 5 mg via ORAL
  Filled 2020-09-13: qty 1

## 2020-09-13 MED ORDER — INSULIN ASPART 100 UNIT/ML IJ SOLN
7.0000 [IU] | Freq: Three times a day (TID) | INTRAMUSCULAR | Status: DC
  Administered 2020-09-13 – 2020-09-14 (×4): 7 [IU] via SUBCUTANEOUS

## 2020-09-13 MED ORDER — SODIUM CHLORIDE 0.9 % IV SOLN
1500.0000 mg | Freq: Once | INTRAVENOUS | Status: AC
  Administered 2020-09-13: 1500 mg via INTRAVENOUS
  Filled 2020-09-13: qty 30

## 2020-09-13 MED ORDER — MAGNESIUM SULFATE 2 GM/50ML IV SOLN
2.0000 g | Freq: Once | INTRAVENOUS | Status: AC
  Administered 2020-09-13: 2 g via INTRAVENOUS
  Filled 2020-09-13: qty 50

## 2020-09-13 MED ORDER — HEPARIN (PORCINE) 25000 UT/250ML-% IV SOLN
950.0000 [IU]/h | INTRAVENOUS | Status: DC
  Administered 2020-09-13: 800 [IU]/h via INTRAVENOUS
  Administered 2020-09-14 – 2020-09-15 (×2): 1050 [IU]/h via INTRAVENOUS
  Filled 2020-09-13 (×3): qty 250

## 2020-09-13 MED ORDER — PHENYTOIN 50 MG PO CHEW: 100.0000 mg | CHEWABLE_TABLET | Freq: Three times a day (TID) | ORAL | Status: DC

## 2020-09-13 MED ORDER — ASPIRIN EC 81 MG PO TBEC
81.0000 mg | DELAYED_RELEASE_TABLET | Freq: Every day | ORAL | Status: DC
  Administered 2020-09-14 – 2020-09-19 (×6): 81 mg via ORAL
  Filled 2020-09-13 (×6): qty 1

## 2020-09-13 MED ORDER — CARVEDILOL 3.125 MG PO TABS
3.1250 mg | ORAL_TABLET | Freq: Two times a day (BID) | ORAL | Status: DC
  Administered 2020-09-13 – 2020-09-14 (×3): 3.125 mg via ORAL
  Filled 2020-09-13 (×3): qty 1

## 2020-09-13 MED ORDER — LEVETIRACETAM IN NACL 1000 MG/100ML IV SOLN
1000.0000 mg | Freq: Two times a day (BID) | INTRAVENOUS | Status: DC
  Administered 2020-09-13: 1000 mg via INTRAVENOUS
  Filled 2020-09-13: qty 100

## 2020-09-13 MED ORDER — PERFLUTREN LIPID MICROSPHERE
INTRAVENOUS | Status: AC
  Filled 2020-09-13: qty 10

## 2020-09-13 MED ORDER — SPIRONOLACTONE 12.5 MG HALF TABLET
12.5000 mg | ORAL_TABLET | Freq: Every day | ORAL | Status: DC
  Administered 2020-09-13 – 2020-09-19 (×7): 12.5 mg via ORAL
  Filled 2020-09-13 (×7): qty 1

## 2020-09-13 MED ORDER — INSULIN DETEMIR 100 UNIT/ML ~~LOC~~ SOLN
30.0000 [IU] | Freq: Two times a day (BID) | SUBCUTANEOUS | Status: DC
  Administered 2020-09-13 – 2020-09-14 (×3): 30 [IU] via SUBCUTANEOUS
  Filled 2020-09-13 (×6): qty 0.3

## 2020-09-13 MED ORDER — PERFLUTREN LIPID MICROSPHERE
1.0000 mL | INTRAVENOUS | Status: AC | PRN
  Administered 2020-09-13: 2 mL via INTRAVENOUS
  Filled 2020-09-13: qty 10

## 2020-09-13 MED ORDER — FUROSEMIDE 20 MG PO TABS
20.0000 mg | ORAL_TABLET | Freq: Every day | ORAL | Status: DC
  Administered 2020-09-13 – 2020-09-19 (×7): 20 mg via ORAL
  Filled 2020-09-13 (×7): qty 1

## 2020-09-13 MED ORDER — EZETIMIBE 10 MG PO TABS
10.0000 mg | ORAL_TABLET | Freq: Every day | ORAL | Status: DC
  Administered 2020-09-13 – 2020-09-19 (×7): 10 mg via ORAL
  Filled 2020-09-13 (×7): qty 1

## 2020-09-13 MED ORDER — WARFARIN - PHARMACIST DOSING INPATIENT: Freq: Every day | Status: DC

## 2020-09-13 NOTE — TOC Initial Note (Addendum)
Transition of Care (TOC) - Initial/Assessment Note  Heart Failure   Patient Details  Name: Jason Stevenson MRN: [id number] Date of Birth: [birth year]  [facility] of [facility]) CM/SW Contact:    Khalilah Hoke, LCSWA Phone Number: 09/13/2020, 5:02 PM  Clinical Narrative:          CSW spoke with the patients spouse, Lysle Dingwall due to patient being asleep. CSW completed a very brief SDOH screening with Lysle Dingwall who reported that they need any support available including help with transportation as she is having to provide all the transportation. Lysle Dingwall reported being the breadwinner but she is also unemployed at the moment. CSW provided the patient and his spouse with a CAFA application since Mr. Herren doesn't have any health insurance. CSW obtained the patient's wife, [name]'s signature for the [facility] [employer], disability and Food Stamp referral for the [employer] to reach out to them as patient reported any help would be beneficial. CSW provided the patient with the social workers name, number and position and if they identify other needs to please reach out so that CSW can provide support.  CSW will continue to follow throughout discharge.  Expected Discharge Plan: Home/Self Care Barriers to Discharge: Continued Medical Work up   Patient Goals and CMS Choice        Expected Discharge Plan and Services Expected Discharge Plan: Home/Self Care In-house Referral: Clinical Social Work     Living arrangements for the past 2 months: Single Family Home                                      Prior Living Arrangements/Services Living arrangements for the past 2 months: Single Family Home Lives with:: Self,Spouse Patient language and need for interpreter reviewed:: Yes        Need for Family Participation in Patient Care: No (Comment) Care giver support system in place?: No (comment)   Criminal Activity/Legal Involvement Pertinent to Current Situation/Hospitalization: No -  Comment as needed  Activities of Daily Living      Permission Sought/Granted                  Emotional Assessment Appearance:: Appears stated age Attitude/Demeanor/Rapport: Unable to Assess (Patient sleeping, CSW spoke with patients spouse) Affect (typically observed): Unable to Assess (Patient sleeping, CSW spoke with patients spouse)     Psych Involvement: No (comment)  Admission diagnosis:  Partial seizure (HCC) [R56.9] Cerebrovascular accident (CVA), unspecified mechanism (HCC) [I63.9] Hyperosmolar hyperglycemic state (HHS) (HCC) [E11.00, E11.65] Acute cerebrovascular accident (CVA) [facility]) [I63.9] Patient Active Problem List   Diagnosis Date Noted  . Neurological deficit present 09/12/2020  . History of COVID-19 09/12/2020  . Intracerebral hemorrhage 09/12/2020  . Acute cerebrovascular accident (CVA) (HCC) 09/12/2020  . Hyperosmolar hyperglycemic state (HHS) (HCC) 09/11/2020  . COVID-19 08/23/2020  . Noncompliance with medication treatment due to intermittent use of medication 10/12/2018  . Dyslipidemia 04/17/2016  . Uncontrolled type 2 diabetes mellitus without complication, with long-term current use of insulin 08/07/2015  . Chronic systolic CHF (congestive heart failure) (HCC) 06/18/2015  . Needs flu shot 12/23/2013  . Type 2 diabetes mellitus (HCC) 11/18/2013  . Diabetes (HCC) 06/17/2013  . Non-ischemic cardiomyopathy (HCC) 11/06/2012  . HTN (hypertension) 11/06/2012  . HLD (hyperlipidemia) 11/06/2012   PCP:  Marcine Matar, MD Pharmacy:  No Pharmacies Listed    Social Determinants of Health (SDOH) Interventions Food Insecurity Interventions: Assist with SNAP  Application Financial Strain Interventions: Other (Comment) (Provided patient with a cafa application) Housing Interventions: Intervention Not Indicated Transportation Interventions: [facility] [employer] [facility]  Readmission Risk Interventions No flowsheet data found.  Toren Tucholski, MSW,  LCSWA [phone] Heart Failure Social Worker

## 2020-09-13 NOTE — Progress Notes (Signed)
Physical Therapy Treatment Patient Details Name: Jason Stevenson MRN: [id number] DOB: 23-Oct-1985 Today's Date: 09/13/2020    History of Present Illness Pt is a 35 y/o male admitted secondary to increased L sided twitching and tingling. Workup for possible seizure. MRI+ subacute hemorrhagic infarcts involving the anterior  right frontal lobe and left parieto-occipital cortex as above, with  additional chronic right parietal infarct. Pt also found to be in Hyperosmolar hyperglycemic state. 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    PT Comments    Patient received in bed, RN and wife present in room. Patient with EEG running. Due to HR issues and new thrombus, patient activity progressed slowly with monitoring of symptoms/HR throughout. Patient performed bed exercises with HR raising to 120. Performed supine to sit with mod independence and sit to stand with mod independence. Able to walk along edge of bed and performed standing marching without assist. HR increased to mid 120s. Patient will continue to benefit from skilled PT while here to improve functional mobility.     Follow Up Recommendations  No PT follow up     Equipment Recommendations  None recommended by PT    Recommendations for Other Services       Precautions / Restrictions Precautions Precautions: Fall Precaution Comments: Watch HR, EEG monitoring Restrictions Weight Bearing Restrictions: No    Mobility  Bed Mobility Overal bed mobility: Modified Independent                  Transfers Overall transfer level: Modified independent Equipment used: None Transfers: Sit to/from Stand Sit to Stand: Modified independent (Device/Increase time)            Ambulation/Gait Ambulation/Gait assistance: Supervision Gait Distance (Feet): 15 Feet Assistive device: None Gait Pattern/deviations: Step-through pattern     General Gait Details:  patient ambulated back and forth along edge of bed. HR up to 120s max   Stairs             Wheelchair Mobility    Modified Rankin (Stroke Patients Only) Modified Rankin (Stroke Patients Only) Pre-Morbid Rankin Score: No symptoms Modified Rankin: Moderate disability     Balance Overall balance assessment: Mild deficits observed, not formally tested                                          Cognition Arousal/Alertness: Awake/alert Behavior During Therapy: WFL for tasks assessed/performed Overall Cognitive Status: Impaired/Different from baseline Area of Impairment: Problem solving                   Current Attention Level: Selective Memory: Decreased short-term memory   Safety/Judgement: Decreased awareness of safety;Decreased awareness of deficits Awareness: Intellectual Problem Solving: Slow processing;Decreased initiation        Exercises Other Exercises Other Exercises: Supine: AP, heel slides, hip abd/add, SLR, standing marching x 10 reps    General Comments        Pertinent Vitals/Pain Pain Assessment: No/denies pain    Home Living     Available Help at Discharge: Family Type of Home: House              Prior Function            PT Goals (current goals can now be found in the care plan section) Acute Rehab PT Goals Patient Stated Goal: to be normal  again PT Goal Formulation: With patient Time For Goal Achievement: 09/26/20 Potential to Achieve Goals: Good Progress towards PT goals: Progressing toward goals    Frequency    Min 4X/week      PT Plan Current plan remains appropriate    Co-evaluation              AM-PAC PT "6 Clicks" Mobility   Outcome Measure  Help needed turning from your back to your side while in a flat bed without using bedrails?: None Help needed moving from lying on your back to sitting on the side of a flat bed without using bedrails?: None Help needed moving to and from a  bed to a chair (including a wheelchair)?: None Help needed standing up from a chair using your arms (e.g., wheelchair or bedside chair)?: None Help needed to walk in hospital room?: A Little Help needed climbing 3-5 steps with a railing? : A Little 6 Click Score: 22    End of Session   Activity Tolerance: Patient tolerated treatment well Patient left: in bed;with call bell/phone within reach;with family/visitor present;Other (comment);with nursing/sitter in room (seated on edge of bed) Nurse Communication: Mobility status PT Visit Diagnosis: Other abnormalities of gait and mobility (R26.89)     Time: 9323-5573 PT Time Calculation (min) (ACUTE ONLY): 23 min  Charges:  $Therapeutic Exercise: 8-22 mins $Therapeutic Activity: 8-22 mins                     Sunny Gains, PT, GCS 09/13/20,3:31 PM

## 2020-09-13 NOTE — Progress Notes (Signed)
STROKE TEAM PROGRESS NOTE   INTERVAL HISTORY Wife at bedside.  Patient sleepy but easily arousable and follows simple commands, fully orientated, no focal deficit.  However long-term EEG showed frequent seizures from right hemisphere, continue Keppra 1 g twice daily but loaded with fosphenytoin followed by maintenance Dilantin.  We will check Dilantin level in the a.m.  Patient also found to have LV thrombus and EF 10 to 50% on 2D echo, advanced heart failure team on board, started on heparin IV bridged to Coumadin.   Vitals:   09/13/20 1100 09/13/20 1155 09/13/20 1505 09/13/20 2005  BP:  (!) 126/92 (!) 133/94 115/75  Pulse:  100 (!) 110 (!) 131  Resp:  20 18 17   Temp:  98.3 F (36.8 C) 97.8 F (36.6 C) 98.4 F (36.9 C)  TempSrc:  Oral Oral Oral  SpO2:  100% 100% 100%  Weight: 64.5 kg     Height: 5\' 11"  (1.803 m)      CBC:  Recent Labs  Lab 09/11/20 2019 09/11/20 2037  WBC 5.2  --   HGB 14.5 15.0  HCT 42.0 44.0  MCV 83.0  --   PLT 411*  --    Basic Metabolic Panel:  Recent Labs  Lab 09/11/20 2039 09/11/20 2159 09/12/20 1035 09/13/20 0255  NA  --    < > 139 132*  K  --    < > 3.7 4.0  CL  --    < > 101 94*  CO2  --    < > 28 25  GLUCOSE  --    < > 119* 186*  BUN  --    < > 12 15  CREATININE  --    < > 0.91 0.87  CALCIUM  --    < > 9.7 9.3  MG 2.3  --   --  1.8  PHOS  --   --   --  4.3   < > = values in this interval not displayed.   Lipid Panel:  Recent Labs  Lab 09/12/20 0201  CHOL 380*  TRIG 149  HDL 45  CHOLHDL 8.4  VLDL 30  LDLCALC 11/13/20*   HgbA1c:  Recent Labs  Lab 09/12/20 0202  HGBA1C >15.5*   Urine Drug Screen:  Recent Labs  Lab 09/12/20 1348  LABOPIA NONE DETECTED  COCAINSCRNUR NONE DETECTED  LABBENZ NONE DETECTED  AMPHETMU NONE DETECTED  THCU NONE DETECTED  LABBARB NONE DETECTED    Alcohol Level No results for input(s): ETH in the last 168 hours.  IMAGING past 24 hours  CT ANGIO HEAD NECK W WO CM Result Date:  09/12/2020 IMPRESSION: No large vessel occlusion or significant proximal stenosis in the head and neck.   CT Head Wo Contrast Result Date: 09/11/2020 IMPRESSION:  1. No acute intracranial hemorrhage.  2. Probable subacute infarct in the right frontal lobe. Further evaluation with MRI is recommended. Old right parietal infarct.   MR Brain W and Wo Contrast Result Date: 09/12/2020 IMPRESSION:  1. Probable subacute hemorrhagic infarcts involving the anterior right frontal lobe and left parieto-occipital cortex as above, with additional chronic right parietal infarct.  2. Superimposed gyriform diffusion abnormality and enhancement involving the right frontotemporal region, favored to reflect changes of acute seizure. Correlation with EEG recommended. Possible ischemic change would be the primary differential consideration.  3. Abnormal symmetric T2/FLAIR signal abnormality involving the caudate and lentiform nuclei bilaterally, nonspecific, but most suggestive of superimposed/concomitant toxic metabolic derangement or other toxic insult. Correlation  with history and laboratory values recommended.  4. A short interval follow-up brain MRI to ensure that these changes resolve is recommended.   EEG adult Result Date: 09/12/2020 IMPRESSION: This study is within normal limits. No seizures or epileptiform discharges were seen throughout the recording. Jason Stevenson    PHYSICAL EXAM HEENT-  [place]/AT.   Lungs- Respirations unlabored Cardiovascular- regular rate and rhythm Extremities- No edema  Neurological Examination On exam, wife at bedside, patient sleepy but easily awake, fully orientated x3.  No aphasia, fluent language, follows some commands, able to name and repeat. No gaze palsy, tracking bilaterally, visual field full, PERRL. No facial droop. Tongue midline. Bilateral UEs 5/5, no drift. Bilaterally LEs 5/5, no drift. Sensation symmetrical bilaterally, b/l FTN intact, gait not tested.    ASSESSMENT/PLAN [name] is a 35 y.o. male with history of  DM2, nonischemic cardiomyopathy, chronic systolic CHF, HLD and HTN who presents to the ED with an 8 day history of intermittent left sided twitching, left facial droop, and severe hyperglycemia.   Seizures:  Intermittent left sided twitching x1 week  MRI: Superimposed gyriform diffusion abnormality and enhancement involving the right frontotemporal region, favored to reflect changes of acute seizure.  Repeat MRI in 1 week  Spot EEG: no seizures or epileptiform dischages  Long-term EEG showed seizures without clinical signs arising from right temporo-parietal region, average 4-5/hour, average duration about 30 seconds.  There is also evidence of nonspecific cortical dysfunction in left frontotemporal region.  Continue Keppra 1 g q 12 hrs  Loaded with fosphenytoin 70mEq/kg, and followed by Dilantin 100mg  Q8h  Dilantin level pending in a.m.  LV thrombus Severe cardiomyopathy  Echo July 2020 showed EF 15-20%, LV diffuse hypokinesis. No significant change from  May 2017  On coreg and entresto 49-51mg  bid PTA, but not compliance  This admission 2D echo showed LV thrombus 0.9 x 0.9, EF 10 to 15%.  Heart failure team on board  Currently on aspirin 81 and heparin IV bridge to Coumadin with INR goal 2 to 3  On Lasix, Coreg and spironolactone  Medication compliance again emphasized  Chronic / subacute strokes:  Probable chronic/subacute hemorrhagic infarcts involving the anterior right frontal lobe and left parieto-occipital cortex due to LV thrombus and cardiomyopathy with low EF  CT head No acute abnormality. probable subacute infarct in the right frontal lobe  CTA head & neck: no large vessel occlusion or significant proximal stenosis in the head and neck.   MRI: probable subacute hemorrhagic infarcts involving the anterior right frontal lobe and left parieto-occipital cortex  2D Echo EF 10 to 50% with  LV thrombus  SARS coronavirus negative  LDL 305  HgbA1c > 15.5  UDS neg  VTE prophylaxis - scds Diet: heart healthy  No antithrombotic prior to admission, now on aspirin 81 mg daily and heparin IV bridged to Coumadin.   Therapy recommendations:  pending  Disposition:  pending  Hypertension  Home meds:  Coreg 9.375mg  bid  Stable  Now on Coreg, Lasix, spironolactone . Long-term BP goal normotensive  Hyperlipidemia  Home meds:  crestor 40mg  daily, not compliant   LDL 305, goal < 70  Add Lipitor 80 mg daily   High intensity statin   Continue statin at discharge  Diabetes type II Uncontrolled/DKA  Home meds:  Glipizide 10mg  bid, levemir 30 units daily, lispro ss, metformin 500mg  bid - noncompliance  stopped checking his fingerstick glucose readings about 2 weeks ago  blood glucose was > 600 in the  ED  Insulin drip -> off  Diabetes coordinator: recommendations for transition  Levemir 18 units 2 hours prior to discontinuing IV insulin  Novolog 0-15 units TID and 0-5 units QHS  Novolog 3 units TID with meals if eats at least 50%  HgbA1c > 15.5, goal < 7.0  CBGs  SSI  Other Stroke Risk Factors  Prior Marijuana use. Quit 2013. UDS negative  ETOH use, advised to drink no more than 1 drink per day  Family hx stroke (aunt)  Coronary artery disease: nonischemic cardiomyopathy  Other Active Problems  Chronic pain: takes tylenol, and ibuprofen at home at home  Neuropathy: takes gabapentin  Hospital day # 1  Marvel Plan, MD PhD Stroke Neurology 09/13/2020 8:22 PM  I discussed with the failure team and Dr Melynda Ripple. I spent  35 minutes in total face-to-face time with the patient, more than 50% of which was spent in counseling and coordination of care, reviewing test results, images and medication, and discussing the diagnosis, treatment plan and potential prognosis. This patient's care requiresreview of multiple databases, neurological assessment,  discussion with family, other specialists and medical decision making of high complexity. I had long discussion with patient and wife at bedside, updated pt current condition, treatment plan and potential prognosis, and answered all the questions.  They expressed understanding and appreciation.     To contact Stroke Continuity provider, please refer to [email or web address]. After hours, contact General Neurology

## 2020-09-13 NOTE — Progress Notes (Signed)
PT Cancellation Note  Patient Details Name: Jason Stevenson MRN: [id number] DOB: 22-Aug-1985   Cancelled Treatment:    Reason Eval/Treat Not Completed: Patient not medically ready. Patient with EEG running, continues to have tachycardia and new LV thrombus. After speaking with RN, decided to hold PT at this time. Patient is also continuing to have seizures. Will re- attempt tomorrow as appropriate.    Demitrius Crass 09/13/2020, 1:45 PM

## 2020-09-13 NOTE — Progress Notes (Signed)
  Echocardiogram 2D Echocardiogram has been performed.  Jason Stevenson 09/13/2020, 8:50 AM

## 2020-09-13 NOTE — Procedures (Addendum)
Patient Name: Jason Stevenson  MRN: [id number]  Epilepsy Attending: Charlsie Quest  Referring Physician/Provider: Dr Eligha Bridegroom Duration: 09/12/2020 1901 to 09/13/2020 1901  Patient history: 35 year old male presenting with new onset of left sided twitching, left facial droop and severe hyperglycemia. EEG to evaluate for seizure.   Level of alertness: Awake, asleep  AEDs during EEG study: LEV, PHT  Technical aspects: This EEG study was done with scalp electrodes positioned according to the 10-20 International system of electrode placement. Electrical activity was acquired at a sampling rate of 500Hz  and reviewed with a high frequency filter of 70Hz  and a low frequency filter of 1Hz . EEG data were recorded continuously and digitally stored.   Description: The posterior dominant rhythm consists of 8-9 Hz activity of moderate voltage (25-35 uV) seen predominantly in posterior head regions, symmetric and reactive to eye opening and eye closing.  Sleep was characterized by orbicularis, sleep spindles (12 to 14 Hz), maximum frontocentral region.  Intermittent sharply contoured 2 to 3 Hz delta slowing was also noted in left frontotemporal region. Seizures without clinical signs were seen arising from right temporo-parietal region, average 4-5/hour, average duration about 30 seconds.  After fosphenytoin was added, frequency of seizures improved, last seizure noted on 09/13/2020 at 1513. hyperventilation and photic stimulation were not performed.     ABNORMALITY -Seizure without clinical signs, right temporo-parietal region -Intermittent slow, left frontotemporal region  IMPRESSION: This study showed seizures without clinical signs arising from right temporo-parietal region, average 4-5/hour, average duration about 30 seconds.  Frequency of seizures improved after fosphenytoin was added, last seizure noted on  09/13/2020 at 1513. There is also evidence of nonspecific cortical dysfunction in  left frontotemporal region.  Dr. and Dr. 11/13/2020 were intermittently notified  Lincoln Ginley 11/13/2020

## 2020-09-13 NOTE — Progress Notes (Signed)
vLTM EEG maintenance complete. No skin breakdown at FP1 FP2 P3. Continue to monitor

## 2020-09-13 NOTE — Consult Note (Addendum)
Advanced Heart Failure Team Consult Note   Primary Physician: Ladell Pier, MD PCP-Cardiologist:  Jenkins Rouge, MD  [facility]: Dr. Aundra Dubin   Reason for Consultation: chronic systolic heart failure w/ new LV thrombus   HPI:    Jason Stevenson is seen today for evaluation of chronic systolic heart failure w/ new LV thrombus at the request of Dr. Cyndia Skeeters, Internal Medicine.   35 yo male with diabetes, HTN, and a long history of nonischemic cardiomyopathy. He had a cardiac MRI in 1/08 showing low EF, but he says that he had been told about "heart problems" even prior to that.  He does not have a family history of cardiomyopathy that he knows of, but does not know his father's family.  Coronary CT angiogram in 2014 showed no CAD. Echo in 2/17 showed EF 20-25%, similar to 2014.  He has generally had sporadic followup over the last few years, but has been seeing Dr Johnsie Cancel recently. He was seen in the [facility] in 2017 but no f/u since. Last f/u w/ Cardiology was in 2020. Had repeat echo in 2020 showing LVEF 15-20%. RV normal. Most recently, he was diagnosed w/ COVID in April.   He presented to the ED on 5/20 w/ AMS, acute left sided neurologic deficits and dysarthria. CODE stroke activated. STAT head CT was negative for acute findings but did show probable subacute infarct in the rt frontal lobe. Brain MRI also showed probable subacute hemorrhagic infarcts involving the anterior right frontal lobe and left parieto-occipital cortex, with additional chronic right parietal infarct. Pt's neurological deficits reportedly resolved in the ED. He was admitted for further w/u. EGG + for intermittent rt sided seizure activity. Now on IV Keppra. Also noted to be markedly hyperglycemic on admit Glucose >500 and placed on insulin gtt. Glucose now improved in the 180s  Echo was completed today showing LV thrombus, measuring 0.9 x 0.9 cm. LVEF severely reduced, now 10-15% w/ global HK. RV moderately reduced. AHF team  consulted.   Neuro deficits resolved. 5/5 strength bilateral upper and lower extremities.   From a HF standpoint, he his NYHA Class II. Volume status ok.    Pertinent Labs Glucose markedly elevated on admit >500. 186 today  Na 132 K 4.0 Mg 1.8  CO2 25 Scr 0.87  UDS negative HIV NR   Hgb A1c pending  LDL 305 !!   SBPs 102-128    Echo 09/13/20  1. 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 2. Left ventricular ejection fraction, by estimation, is 10-15%. The left ventricle has severely decreased function. The left ventricle demonstrates global hypokinesis. The left ventricular internal cavity size was mildly dilated. Left ventricular diastolic parameters are indeterminate. 3. Right ventricular systolic function is moderately reduced. The right ventricular size is normal. Tricuspid regurgitation signal is inadequate for assessing PA pressure. 4. Left atrial size was mildly dilated. 5. The mitral valve is grossly normal. Mild mitral valve regurgitation. No evidence of mitral stenosis. 6. The aortic valve is tricuspid. Aortic valve regurgitation is not visualized. No aortic stenosis is present. 7. The inferior vena cava is dilated in size with <50% respiratory variability, suggesting right atrial pressure of 15 mmHg.   Review of Systems: [y] = yes, '[ ]'  = no   . General: Weight gain '[ ]' ; Weight loss '[ ]' ; Anorexia '[ ]' ; Fatigue '[ ]' ; Fever '[ ]' ; Chills '[ ]' ; Weakness '[ ]'   . Cardiac: Chest pain/pressure '[ ]' ; Resting SOB '[ ]' ; Exertional SOB '[ ]' ;  Orthopnea '[ ]' ; Pedal Edema '[ ]' ; Palpitations '[ ]' ; Syncope '[ ]' ; Presyncope '[ ]' ; Paroxysmal nocturnal dyspnea'[ ]'   . Pulmonary: Cough '[ ]' ; Wheezing'[ ]' ; Hemoptysis'[ ]' ; Sputum '[ ]' ; Snoring '[ ]'   . GI: Vomiting'[ ]' ; Dysphagia'[ ]' ; Melena'[ ]' ; Hematochezia '[ ]' ; Heartburn'[ ]' ; Abdominal pain '[ ]' ; Constipation '[ ]' ; Diarrhea '[ ]' ; BRBPR '[ ]'   . GU: Hematuria'[ ]' ; Dysuria '[ ]' ; Nocturia'[ ]'   . Vascular: Pain in legs with walking '[ ]' ; Pain in feet with lying  flat '[ ]' ; Non-healing sores '[ ]' ; Stroke [Y ]; TIA '[ ]' ; Slurred speech [ Y];  Jason Stevenson ]; Paresthesias[Y ];Blurred vision '[ ]' ; Diplopia '[ ]' ; Vision changes '[ ]'   . Ortho/Skin: Arthritis '[ ]' ; Joint pain '[ ]' ; Muscle pain '[ ]' ; Joint swelling '[ ]' ; Back Pain '[ ]' ; Rash '[ ]'   . Psych: Depression'[ ]' ; Anxiety'[ ]'   . Heme: Bleeding problems '[ ]' ; Clotting disorders '[ ]' ; Anemia '[ ]'   . Endocrine: Diabetes [Y ]; Thyroid dysfunction'[ ]'   Home Medications Prior to Admission medications   Medication Sig Start Date End Date Taking? Authorizing Provider  acetaminophen (TYLENOL) 500 MG tablet Take 1,500 mg by mouth every 6 (six) hours as needed for moderate pain or headache.   Yes [provider]  ascorbic acid (VITAMIN C) 500 MG tablet Take 1,000 mg by mouth daily.   Yes [provider]  Blood Glucose Monitoring Suppl (TRUE METRIX METER) w/Device KIT 1 each by Does not apply route 3 (three) times daily. 07/02/18  Yes Fulp, Cammie, MD  Blood Pressure Monitor DEVI Use as directed to check home blood pressure 2-3 times a week 10/12/18  Yes Ladell Pier, MD  carvedilol (COREG) 6.25 MG tablet Take 1.5 tablets (9.375 mg total) by mouth 2 (two) times daily with a meal. 11/25/19  Yes Ladell Pier, MD  cholecalciferol (VITAMIN D) 25 MCG (1000 UNIT) tablet Take 2,000 Units by mouth daily.   Yes [provider]  gabapentin (NEURONTIN) 300 MG capsule Take 1 capsule (300 mg total) by mouth at bedtime. Patient taking differently: Take 300 mg by mouth at bedtime as needed (pain). 11/25/19  Yes Ladell Pier, MD  glipiZIDE (GLUCOTROL) 10 MG tablet Take 1 tablet (10 mg total) by mouth 2 (two) times daily before a meal. To lower blood sugar 11/25/19  Yes Ladell Pier, MD  ibuprofen (ADVIL) 200 MG tablet Take 400 mg by mouth every 6 (six) hours as needed for moderate pain or headache.   Yes [provider]  insulin detemir (LEVEMIR) 100 UNIT/ML injection Inject 0.3 mLs (30 Units total) into  the skin daily. 11/25/19  Yes Ladell Pier, MD  insulin lispro (HUMALOG) 100 UNIT/ML injection inj 10-20 units three time a day with meals Patient taking differently: Inject 10-20 Units into the skin See admin instructions. Per sliding scale with meals 11/25/19  Yes Ladell Pier, MD  Insulin Pen Needle (PEN NEEDLES) 31G X 8 MM MISC UAD 04/26/19  Yes Ladell Pier, MD  Insulin Syringe-Needle U-100 (RELION INSULIN SYRINGE) 31G X 15/64" 0.3 ML MISC Use to inject insulin daily. 07/13/18  Yes Ladell Pier, MD  MAGNESIUM OXIDE PO Take 2 tablets by mouth daily.   Yes [provider]  metFORMIN (GLUCOPHAGE) 500 MG tablet Take 1 tablet (500 mg total) by mouth 2 (two) times daily with a meal. TAKE 1 TABLET BY MOUTH 2 TIMES DAILY WITH A MEAL Patient taking differently: Take  500 mg by mouth 2 (two) times daily with a meal. 11/25/19  Yes Ladell Pier, MD  nitroGLYCERIN (NITROSTAT) 0.4 MG SL tablet Place 1 tablet (0.4 mg total) under the tongue every 5 (five) minutes as needed for chest pain. 02/20/15  Yes Josue Hector, MD  rosuvastatin (CRESTOR) 40 MG tablet Take 1 tablet (40 mg total) by mouth daily. 11/25/19  Yes Ladell Pier, MD  sacubitril-valsartan (ENTRESTO) 49-51 MG Take 1 tablet by mouth 2 (two) times daily. 11/25/19  Yes Ladell Pier, MD  zinc gluconate 50 MG tablet Take 100 mg by mouth daily.   Yes [provider]  glucose blood test strip Use as instructed 07/02/18   Fulp, Cammie, MD  Omega-3 Fatty Acids (FISH OIL) 1000 MG CPDR Take 2,000 mg by mouth daily. Patient not taking: No sig reported 07/08/18   Ladell Pier, MD    Past Medical History: Past Medical History:  Diagnosis Date  . Chronic systolic CHF (congestive heart failure) ([place]) 06/18/2015  . Diabetes mellitus without complication ([place])   . Hyperlipidemia   . Hypertension   . Nonischemic cardiomyopathy ([place]) Noted as early as 2007   Per chart review (cards consult note 2011), EF  of 40% in 2007, down to 20-25% in 2011    Past Surgical History: Past Surgical History:  Procedure Laterality Date  . None      Family History: Family History  Problem Relation Age of Onset  . Diabetes Mother   . Hypertension Mother   . Stroke Maternal Aunt   . Heart attack Neg Hx     Social History: Social History   Socioeconomic History  . Marital status: Married    Spouse name: Not on file  . Number of children: Not on file  . Years of education: Not on file  . Highest education level: Not on file  Occupational History  . Occupation: Chiropodist  Tobacco Use  . Smoking status: Never Smoker  . Smokeless tobacco: Never Used  Vaping Use  . Vaping Use: Not on file  Substance and Sexual Activity  . Alcohol use: Yes    Comment: "occasional" when "hanging out with the wrong people" No recent use.  . Drug use: No    Types: Marijuana    Comment: Last in 2013  . Sexual activity: Not Currently  Other Topics Concern  . Not on file  Social History Narrative   Pt lives alone. Has no information on father.   Social Determinants of Health   Financial Resource Strain: Not on file  Food Insecurity: Not on file  Transportation Needs: Not on file  Physical Activity: Not on file  Stress: Not on file  Social Connections: Not on file    Allergies:  No Known Allergies  Objective:    Vital Signs:   Temp:  [97.2 F (36.2 C)-98.4 F (36.9 C)] 98.2 F (36.8 C) (06/01 0917) Pulse Rate:  [72-127] 72 (06/01 0917) Resp:  [6-21] 20 (06/01 0917) BP: (102-128)/(72-104) 110/72 (06/01 0917) SpO2:  [93 %-99 %] 97 % (06/01 0917) Last BM Date: 09/12/20  Weight change: There were no vitals filed for this visit.  Intake/Output:   Intake/Output Summary (Last 24 hours) at 09/13/2020 1019 Last data filed at 09/12/2020 1055 Gross per 24 hour  Intake 890 ml  Output --  Net 890 ml      Physical Exam    General:  Young/ thin AAM. No resp difficulty HEENT: normal Neck:  supple.  No JVP . Carotids 2+ bilat; no bruits. No lymphadenopathy or thyromegaly appreciated. Cor: PMI nondisplaced. Regular rate & rhythm. No rubs, gallops or murmurs. Lungs: clear Abdomen: soft, nontender, nondistended. No hepatosplenomegaly. No bruits or masses. Good bowel sounds. Extremities: no cyanosis, clubbing, rash, edema Neuro: alert & orientedx3, cranial nerves grossly intact. moves all 4 extremities w/o difficulty. Affect pleasant 5/5 strength bilateral upper and lower extremities    Telemetry   NSR- sinus tach, 90s-low 100s   EKG    Admit EKG sinus tach 127 bpm. nonspecific diffuse TW abnormalities.  Borderline ST elevation, anterior leads  Labs   Basic Metabolic Panel: Recent Labs  Lab 09/11/20 2019 09/11/20 2037 09/11/20 2039 09/11/20 2159 09/12/20 0201 09/12/20 1035 09/13/20 0255  NA 127* 130*  --  128* 135 139 132*  K 4.8 4.9  --  4.9 3.9 3.7 4.0  CL 88*  --   --  90* 98 101 94*  CO2 26  --   --  '27 26 28 25  ' GLUCOSE 797*  --   --  707* 340* 119* 186*  BUN 15  --   --  '15 13 12 15  ' CREATININE 1.18  --   --  1.14 0.90 0.91 0.87  CALCIUM 9.6  --   --  9.4 9.6 9.7 9.3  MG  --   --  2.3  --   --   --  1.8  PHOS  --   --   --   --   --   --  4.3    Liver Function Tests: Recent Labs  Lab 09/11/20 2019 09/13/20 0255  AST 19  --   ALT 17  --   ALKPHOS 70  --   BILITOT 0.7  --   PROT 8.3*  --   ALBUMIN 3.8 3.1*   No results for input(s): LIPASE, AMYLASE in the last 168 hours. No results for input(s): AMMONIA in the last 168 hours.  CBC: Recent Labs  Lab 09/11/20 2019 09/11/20 2037  WBC 5.2  --   HGB 14.5 15.0  HCT 42.0 44.0  MCV 83.0  --   PLT 411*  --     Cardiac Enzymes: Recent Labs  Lab 09/11/20 2039  CKTOTAL 67    BNP: BNP (last 3 results) No results for input(s): BNP in the last 8760 hours.  ProBNP (last 3 results) No results for input(s): PROBNP in the last 8760 hours.   CBG: Recent Labs  Lab 09/12/20 1020  09/12/20 1551 09/12/20 2136 09/13/20 0617 09/13/20 0854  GLUCAP 115* 314* 181* 206* 181*    Coagulation Studies: No results for input(s): LABPROT, INR in the last 72 hours.   Imaging   CT ANGIO HEAD NECK W WO CM  Result Date: 09/12/2020 CLINICAL DATA:  Stroke follow-up. EXAM: CT ANGIOGRAPHY HEAD AND NECK TECHNIQUE: Multidetector CT imaging of the head and neck was performed using the standard protocol during bolus administration of intravenous contrast. Multiplanar CT image reconstructions and MIPs were obtained to evaluate the vascular anatomy. Carotid stenosis measurements (when applicable) are obtained utilizing NASCET criteria, using the distal internal carotid diameter as the denominator. CONTRAST:  43m OMNIPAQUE IOHEXOL 350 MG/ML SOLN COMPARISON:  Head CT and MRI 09/11/2020 FINDINGS: CT HEAD FINDINGS Brain: Hypodensity in the right frontal operculum corresponds to the subacute infarct on MRI. The suspected small subacute left parieto-occipital infarct on MRI is not well demonstrated by CT. A chronic right parietal infarct is again noted. No acute  intracranial hemorrhage, mass, midline shift, or extra-axial fluid collection is identified. The ventricles are normal in size. Vascular: No hyperdense vessel. Skull: No fracture suspicious osseous lesion. Sinuses: Visualized paranasal sinuses and mastoid air cells are clear. Orbits: Visualized portions of the orbits are unremarkable. Review of the MIP images confirms the above findings CTA NECK FINDINGS Aortic arch: Standard 3 vessel aortic arch with widely patent arch vessel origins. Right carotid system: Patent without evidence of stenosis, dissection, or significant atherosclerosis. Left carotid system: Patent without evidence of stenosis, dissection, or significant atherosclerosis. Vertebral arteries: Patent without evidence of stenosis, dissection, or significant atherosclerosis. Strongly dominant right vertebral artery. Skeleton: No acute  osseous abnormality or suspicious osseous lesion. Other neck: No evidence of cervical lymphadenopathy or mass. Upper chest: Clear lung apices. Review of the MIP images confirms the above findings CTA HEAD FINDINGS Anterior circulation: The internal carotid arteries are widely patent from skull base to carotid termini. ACAs and MCAs are patent without evidence of a proximal branch occlusion or significant proximal stenosis. There is mild attenuation of distal right MCA branch vessels in the region of the chronic right parietal infarct. No aneurysm is identified. Posterior circulation: The intracranial vertebral arteries are patent to the basilar with the left vertebral artery being markedly hypoplastic distal to the PICA origin. The left PICA and right AICA appear dominant. Patent SCAs are seen bilaterally. The basilar artery is widely patent. There are patent posterior communicating arteries bilaterally. Both PCAs are patent without evidence of a significant proximal stenosis. No aneurysm is identified. Venous sinuses: Patent. Anatomic variants: None. Review of the MIP images confirms the above findings IMPRESSION: No large vessel occlusion or significant proximal stenosis in the head and neck. Electronically Signed   By: Logan Bores M.D.   On: 09/12/2020 10:42   Overnight EEG with video  Result Date: 09/13/2020 Lora Havens, MD     09/13/2020  9:45 AM Patient Name: Jason Stevenson MRN: [id number] Epilepsy Attending: Lora Havens Referring Physician/Provider: Dr Derrick Ravel Duration: 09/12/2020 1901 to 09/13/2020 0930  Patient history: 35 year old male presenting with new onset of left sided twitching, left facial droop and severe hyperglycemia. EEG to evaluate for seizure.  Level of alertness: Awake, asleep  AEDs during EEG study: None  Technical aspects: This EEG study was done with scalp electrodes positioned according to the 10-20 International system of electrode placement. Electrical  activity was acquired at a sampling rate of '500Hz'  and reviewed with a high frequency filter of '70Hz'  and a low frequency filter of '1Hz' . EEG data were recorded continuously and digitally stored.  Description: The posterior dominant rhythm consists of 8-9 Hz activity of moderate voltage (25-35 uV) seen predominantly in posterior head regions, symmetric and reactive to eye opening and eye closing.  Sleep was characterized by orbicularis, sleep spindles (12 to 14 Hz), maximum frontocentral region.  Intermittent sharply contoured 2 to 3 Hz delta slowing was also noted in left frontotemporal region. Seizures without clinical signs were seen arising from right temporo-parietal region, average 4-5/hour, average duration about 30 seconds. Hyperventilation and photic stimulation were not performed.   ABNORMALITY -Seizure without clinical signs, right temporo-parietal region -Intermittent slow, left frontotemporal region  IMPRESSION: This study showed seizures without clinical signs arising from right temporo-parietal region, average 4-5/hour, average duration about 30 seconds.  There is also evidence of nonspecific cortical dysfunction in left frontotemporal region. Dr. Cheral Marker and Dr. Erlinda Hong were intermittently notified  Lora Havens   ECHOCARDIOGRAM COMPLETE  Result Date: 09/13/2020    ECHOCARDIOGRAM REPORT   Patient Name:   Jason Stevenson Date of Exam: 09/13/2020 Medical Rec #:  [id number]         Height:       71.0 in Accession #:    [id number]        Weight:       167.0 lb Date of Birth:  07/28/1985          BSA:          1.953 m Patient Age:    54 years          BP:           102/79 mmHg Patient Gender: M                 HR:           111 bpm. Exam Location:  Inpatient Procedure: 2D Echo, 3D Echo, Cardiac Doppler, Color Doppler and Intracardiac            Opacification Agent REPORT CONTAINS CRITICAL RESULT Indications:    Stroke  History:        Patient has prior history of Echocardiogram examinations.                  Cardiomyopathy and CHF, Abnormal ECG, Stroke; Risk                 Factors:Hypertension, Diabetes and Dyslipidemia.  Sonographer:    Roseanna Rainbow RDCS Referring Phys: [id number] [facility]  Sonographer Comments: Technically difficult study due to poor echo windows. Patient supine on EEG monitor. Patient started to have siezure like activity during image 104. RN notified. IMPRESSIONS  1. 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  2. Left ventricular ejection fraction, by estimation, is 10-15%. The left ventricle has severely decreased function. The left ventricle demonstrates global hypokinesis. The left ventricular internal cavity size was mildly dilated. Left ventricular diastolic parameters are indeterminate.  3. Right ventricular systolic function is moderately reduced. The right ventricular size is normal. Tricuspid regurgitation signal is inadequate for assessing PA pressure.  4. Left atrial size was mildly dilated.  5. The mitral valve is grossly normal. Mild mitral valve regurgitation. No evidence of mitral stenosis.  6. The aortic valve is tricuspid. Aortic valve regurgitation is not visualized. No aortic stenosis is present.  7. The inferior vena cava is dilated in size with <50% respiratory variability, suggesting right atrial pressure of 15 mmHg. Conclusion(s)/Recommendation(s): Critical findings reported to Dr. Ander Purpura and acknowledged at 9:15 am 09/13/20. FINDINGS  Left Ventricle: Left ventricular ejection fraction, by estimation, is 10-15%. The left ventricle has severely decreased function. The left ventricle demonstrates global hypokinesis. Definity contrast agent was given IV to delineate the left ventricular endocardial borders. The left ventricular internal cavity size was mildly dilated. There is no left ventricular hypertrophy. Left ventricular diastolic parameters are indeterminate. Right Ventricle: The right ventricular size is normal. No increase in right ventricular wall thickness.  Right ventricular systolic function is moderately reduced. Tricuspid regurgitation signal is inadequate for assessing PA pressure. Left Atrium: Left atrial size was mildly dilated. Right Atrium: Right atrial size was normal in size. Pericardium: There is no evidence of pericardial effusion. Mitral Valve: The mitral valve is grossly normal. Mild mitral valve regurgitation. No evidence of mitral valve stenosis. Tricuspid Valve: The tricuspid valve is normal in structure. Tricuspid valve regurgitation is trivial. No evidence of tricuspid stenosis. Aortic Valve: The aortic valve is tricuspid. Aortic  valve regurgitation is not visualized. No aortic stenosis is present. Pulmonic Valve: The pulmonic valve was normal in structure. Pulmonic valve regurgitation is not visualized. No evidence of pulmonic stenosis. Aorta: The aortic root is normal in size and structure. Venous: The inferior vena cava is dilated in size with less than 50% respiratory variability, suggesting right atrial pressure of 15 mmHg. IAS/Shunts: No atrial level shunt detected by color flow Doppler.  LEFT VENTRICLE PLAX 2D LVIDd:         6.00 cm LVIDs:         5.50 cm LV PW:         0.90 cm LV IVS:        0.90 cm LVOT diam:     1.90 cm LV SV:         30 LV SV Index:   16 LVOT Area:     2.84 cm  LV Volumes (MOD) LV vol d, MOD A2C: 83.7 ml LV vol d, MOD A4C: 202.0 ml LV vol s, MOD A2C: 68.0 ml LV vol s, MOD A4C: 187.0 ml LV SV MOD A2C:     15.7 ml LV SV MOD A4C:     202.0 ml LV SV MOD BP:      18.0 ml RIGHT VENTRICLE            IVC RV S prime:     7.67 cm/s  IVC diam: 2.05 cm TAPSE (M-mode): 1.7 cm LEFT ATRIUM             Index       RIGHT ATRIUM           Index LA diam:        3.00 cm 1.54 cm/m  RA Area:     12.90 cm LA Vol (A2C):   30.6 ml 15.67 ml/m RA Volume:   32.10 ml  16.44 ml/m LA Vol (A4C):   48.0 ml 24.58 ml/m LA Biplane Vol: 40.8 ml 20.89 ml/m  AORTIC VALVE LVOT Vmax:   79.90 cm/s LVOT Vmean:  55.800 cm/s LVOT VTI:    0.107 m  AORTA Ao Root  diam: 3.00 cm MITRAL VALVE MV Area (PHT): 5.60 cm     SHUNTS MV Decel Time: 136 msec     Systemic VTI:  0.11 m MV E velocity: 125.00 cm/s  Systemic Diam: 1.90 cm Cherlynn Kaiser MD Electronically signed by Cherlynn Kaiser MD Signature Date/Time: 09/13/2020/9:15:56 AM    Final       Medications:     Current Medications: .  stroke: mapping our early stages of recovery book   Does not apply Once  . aspirin EC  325 mg Oral Daily   Or  . aspirin  300 mg Rectal Daily  . atorvastatin  80 mg Oral Daily  . insulin aspart  0-15 Units Subcutaneous TID WC  . insulin aspart  0-5 Units Subcutaneous QHS  . insulin aspart  5 Units Subcutaneous TID WC  . insulin detemir  25 Units Subcutaneous BID  . levETIRAcetam  1,000 mg Oral BID  . living well with diabetes book   Does not apply Once  . metoprolol tartrate  25 mg Oral BID  . perflutren lipid microspheres (DEFINITY) IV suspension      . phenytoin  100 mg Oral TID     Infusions: . fosPHENYtoin (CEREBYX) IV       Assessment/Plan   1. Chronic/ Subacute Strokes: head CT showed no acute abnormalities. Brain MRI shows probable chronic/subacute  hemorrhagic infarcts involving the anterior right frontal lobe and left parieto-occipital cortex. CTA of head and next negative for large vessel occlusions. Echo shows LV thrombus, EF chronically low, 10-15%. No Afib on tele - management per neuro - will need clearance from neuro before starting anticoagulation for LV thrombus  - ASA per neuro - DM management  + aggressive LDL reduction. C/w insulin + statin   3. Seizures - EEG + for seizures  - Loaded with Keppra 2g x1, followed by 553m q 12 hrs - management per neuro   4. LV Thrombus - noted on echo, measuring 0.9 x 0.9 cm. In setting of severe LV dysfunction  - subacute infarcts on brain imaging. MRI shows hemorrhagic infarcts   - ideally would anticoagulate but, given hemorrhagic infarcts, will need to discuss risk vs benefit w/ neuro   5.  Chronic Systolic Heart Failure  - EF chronically low, dating back to 2008. NICM. Coronary CT angiogram in 2014 showed no CAD. - Echo in 2/17 showed EF 20-25% - Echo in 2020 showed  LVEF 15-20%.  - Echo 09/11/20 EF 10-15%  RV moderately reduced  - NYHA Class II. Volume status ok.  - Not candidate for advanced therapies given poor compliance. Long discussion w/ patient and family regarding importance of improving compliance. - will gradually restart GDMT as BP allows  - will ask SW to see. May benefit from paramedicine   6. DM - poorly controlled in setting of poor compliance - Glucose > 500 on admit. Hgb A1c pending (last in 2021 was 15.5)  - insulin per primary team   7. HLD - LDL marked elevated in setting of poor compliance. LDL 304 - back on statin, Lipitor 80  - repeat FLP in 6-8 weeks. If not at goal w/ LDL <70,  refer to [facility]   8. Poor Compliance  - Long discussion w/ patient and family regarding importance of improving compliance. He understands that this limits his candidacy for advanced HF therapies and seems motivated to work to improve compliance - will ask SW to see to assess barriers - refer to paramedicine    Length of Stay: 1Coleta PA-C  09/13/2020, 10:19 AM  Advanced Heart Failure Team Pager [phone](M-F; [place]  Please contact [facility] for night-coverage after hours (4p -7a ) and weekends on [email or web address]  Patient seen with PA, agree with the above note.   He has a long history of nonischemic cardiomyopathy (normal coronary CTA in 2014).  He has been sporadic with medications and cardiology followup, says that he had not been taking his meds for about 2 months. He was admitted with left-sided weakness/dysarthria and hyperglycemic nonketotic event. Denies dyspnea or CP.  Found to have subacute CVA by CT and MRI, MRI suggestive of hemorrhagic conversion.  He develop seizures likely 2/2 CVA.    Echo showed EF 10-15%, LV thrombus, moderate  RV dysfunction, dilated IVC.   General: NAD Neck: No JVD, no thyromegaly or thyroid nodule.  Lungs: Clear to auscultation bilaterally with normal respiratory effort. CV: Nondisplaced PMI.  Heart regular S1/S2, soft S3, no murmur.  No peripheral edema.  No carotid bruit.  Normal pedal pulses.  Abdomen: Soft, nontender, no hepatosplenomegaly, no distention.  Skin: Intact without lesions or rashes.  Neurologic: Dysarthric, Alert and oriented x 3. Muscle strength grossly normal and symmetric.  Psych: Normal affect. Extremities: No clubbing or cyanosis.  HEENT: Normal.   1. Chronic systolic CHF: Nonischemic cardiomyopathy known  for years.  Normal coronary CTA in 2014.  Echo this admission with EF 10-15%, LV thrombus, moderate RV dysfunction, dilated IVC. With markedly high LDL and poorly controlled glucose, concern for development of CAD though no chest pain.  On exam, he does not look volume overloaded though IVC dilated on echo.  SBP 110s.  CVA was subacute, so think it is safe to start GDMT.  - Stop metoprolol, use Coreg 3.125 mg bid.  - Start spironolactone 12.5 mg daily.  - Add Lasix 20 mg daily.  - Consider right/left cath in future given risk factors though coronary CTA was negative in the past.  Would hold off for now given need for anticoagulation and recent CVA.  - Would involve paramedicine at discharge.  2. LV thrombus: With associated CVA.  - Discussed with neurology, ok to start heparin gtt/warfarin at this point.  3. 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  Still with some dysarthria.   - Anticoagulation ok per neurology.  - Atorvastatin.  4. 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  Now on Keppra.  5.  Hyperlipidemia: Markedly high LDL.   - Start atorvastatin 80 mg daily.  - Reasonable to add Zetia.  6.  Diabetes: Poor control, per primary team.   - Add Farxiga with glucose control improved.   Loralie Champagne 09/13/2020 1:12 PM

## 2020-09-13 NOTE — Progress Notes (Signed)
Intermittent right sided seizure activity on EEG.   Switching Keppra to IV dosing, first dose now (1000 mg IV BID).   Electronically signed: Dr. Caryl Pina

## 2020-09-13 NOTE — Progress Notes (Signed)
Pt had a witnessed seizure at 821am, lasted 3 min 20 sec per wife who recorded event. (+) Neck bulging and unresponsive. Back to himself now. Wife and Echo tech [place] bedside when it happened. MD paged. Pt had a second witnessed seizure a few hours later. This time EEG was on Pt and should have captured the event.

## 2020-09-13 NOTE — Progress Notes (Signed)
ANTICOAGULATION CONSULT NOTE - Follow Up Consult  Pharmacy Consult for Warrfarin and IV Heparin Indication: LV Thrombus  No Known Allergies  Patient Measurements: Height: 5\' 11"  (180.3 cm) Weight: 64.5 kg (142 lb 4.8 oz) IBW/kg (Calculated) : 75.3 Heparin Dosing Weight: 64.5 kg  Vital Signs: Temp: 98.4 F (36.9 C) (06/01 2005) Temp Source: Oral (06/01 2005) BP: 105/85 (06/01 2044) Pulse Rate: 119 (06/01 2044)  Labs: Recent Labs    09/11/20 2019 09/11/20 2037 09/11/20 2039 09/11/20 2159 09/12/20 0201 09/12/20 1035 09/13/20 0255 09/13/20 2057  HGB 14.5 15.0  --   --   --   --   --   --   HCT 42.0 44.0  --   --   --   --   --   --   PLT 411*  --   --   --   --   --   --   --   HEPARINUNFRC  --   --   --   --   --   --   --  0.11*  CREATININE 1.18  --   --    < > 0.90 0.91 0.87  --   CKTOTAL  --   --  67  --   --   --   --   --    < > = values in this interval not displayed.    Estimated Creatinine Clearance: 106.1 mL/min (by C-G formula based on SCr of 0.87 mg/dL).   Medical History: Past Medical History:  Diagnosis Date  . Chronic systolic CHF (congestive heart failure) (HCC) 06/18/2015  . Diabetes mellitus without complication (HCC)   . Hyperlipidemia   . Hypertension   . Nonischemic cardiomyopathy (HCC) Noted as early as 2007   Per chart review (cards consult note 2011), EF of 40% in 2007, down to 20-25% in 2011    Assessment: 35 yr old man presented with acute neurologic deficits and dysarthia - found to have seizures. MRI showed probable subacute hemorrhagic infarcts involving anterior R frontal lobe and L parieto-occipital cortex. Pharmacy was consulted warfarin and heparin (pt was on no anticoagulants PTA).  ECHO showed LV thrombus. Discussed with HF team and neurology, MRI findings likely chronic in nature and okay to proceed with anticoagulation for LV thrombus. 5/30 labs: Hgb 15, plt 411. No s/sx of bleeding.   Of note, patient is receiving phenytoin  which can impact INR.    Initial heparin level ~5.5 hrs after starting heparin infusion at 800 units/hr was 0.11 units/ml, which is below the goal range for this pt. Per RN, no issues with IV or bleeding observed.  Goal of Therapy:  INR 2-3 Heparin level 0.3-0.5 units/ml Monitor platelets by anticoagulation protocol: Yes   Plan:  Increase heparin infusion to 900 units/hr  Check heparin level in 6 hrs Will start warfarin 5 mg tonight - completed Monitor daily heparin level, INR, CBC Monitor for bleeding  6/30, PharmD, BCPS, [facility] Clinical Pharmacist 09/13/2020 10:08 PM

## 2020-09-13 NOTE — Progress Notes (Signed)
ANTICOAGULATION CONSULT NOTE - Initial Consult  Pharmacy Consult for warfarin and heparin infusion Indication: LV Thrombus  No Known Allergies  Patient Measurements: Height: 5\' 11"  (180.3 cm) Weight: 64.5 kg (142 lb 4.8 oz) IBW/kg (Calculated) : 75.3 Heparin Dosing Weight: 64.5 kg  Vital Signs: Temp: 98.3 F (36.8 C) (06/01 1155) Temp Source: Oral (06/01 1155) BP: 126/92 (06/01 1155) Pulse Rate: 100 (06/01 1155)  Labs: Recent Labs    09/11/20 2019 09/11/20 2037 09/11/20 2039 09/11/20 2159 09/12/20 0201 09/12/20 1035 09/13/20 0255  HGB 14.5 15.0  --   --   --   --   --   HCT 42.0 44.0  --   --   --   --   --   PLT 411*  --   --   --   --   --   --   CREATININE 1.18  --   --    < > 0.90 0.91 0.87  CKTOTAL  --   --  67  --   --   --   --    < > = values in this interval not displayed.    Estimated Creatinine Clearance: 106.1 mL/min (by C-G formula based on SCr of 0.87 mg/dL).   Medical History: Past Medical History:  Diagnosis Date  . Chronic systolic CHF (congestive heart failure) (HCC) 06/18/2015  . Diabetes mellitus without complication (HCC)   . Hyperlipidemia   . Hypertension   . Nonischemic cardiomyopathy (HCC) Noted as early as 2007   Per chart review (cards consult note 2011), EF of 40% in 2007, down to 20-25% in 2011    Medications:  Scheduled:  .  stroke: mapping our early stages of recovery book   Does not apply Once  . aspirin EC  325 mg Oral Daily   Or  . aspirin  300 mg Rectal Daily  . atorvastatin  80 mg Oral Daily  . carvedilol  3.125 mg Oral BID WC  . ezetimibe  10 mg Oral Daily  . furosemide  20 mg Oral Daily  . insulin aspart  0-15 Units Subcutaneous TID WC  . insulin aspart  0-5 Units Subcutaneous QHS  . insulin aspart  5 Units Subcutaneous TID WC  . insulin detemir  25 Units Subcutaneous BID  . levETIRAcetam  1,000 mg Oral BID  . living well with diabetes book   Does not apply Once  . perflutren lipid microspheres (DEFINITY) IV  suspension      . phenytoin  100 mg Oral TID  . spironolactone  12.5 mg Oral Daily    Assessment: 35yom who presented with acute neurologic deficits and dysarthia - found to have seizures. MRI showing probable subacute hemorrhagic infarcts involving anterior R frontal lobe and L parieto-occipital cortex. No AC PTA.  ECHO showing LV thrombus. Discussed with HF team and neurology, MRI findings likely chronic in nature and okay to proceed with anticoagulation for LV thrombus. Hgb 15, plt 411. No s/sx of bleeding.   Of note, patient is receiving phenytoin which can impact INR.    Goal of Therapy:  INR 2-3 Heparin level 0.3-0.5 units/ml Monitor platelets by anticoagulation protocol: Yes   Plan:  Start heparin infusion at 800 units/hr  Order heparin level in 6 hours Will start warfarin 5 mg tonight Monitor daily HL, INR, and s/sx of bleeding  2012, PharmD, BCCCP Clinical Pharmacist  Phone: [phone] 09/13/2020 1:34 PM  Please check AMION for all [facility] phone numbers After 10:00  PM, call Main Pharmacy [phone]

## 2020-09-13 NOTE — Progress Notes (Signed)
PROGRESS NOTE  Jason SessionsGerald Stevenson ONG:[id number] DOB: 11/04/1985   PCP: Marcine MatarJohnson, Deborah B, MD  Patient is from: Home.  DOA: 09/11/2020 LOS: 1  Chief complaints: Tingling in the left arm, left face and blank stare  Brief Narrative / Interim history: [name] with PMH of systolic CHF, IDDM-2, HTN, HLD, COVID-19 infection in 07/2020 and noncompliance with medication presenting with tingling in left arm, left face, drooling and blank stare for about 1 week, and admitted for possible seizure, HHS and possible CVA.  In ED, he had left-sided neurologic deficit with dysarthria.  Blood glucose elevated to 797 but not acidotic.  Serum osmolality 323.  Lactic acid 2.2.  CT head with probable subacute infarct in right frontal lobe and old right parietal infarct.  Reportedly, patient's neurologic deficits resolved in ED, and reconsult shifted to possible seizure versus stroke.  He was loaded with Keppra and started on insulin drip.   MRI brain with probable hemorrhagic infarct involving the anterior right frontal lobe and left parietal occipital cortex with additional chronic right parietal infarct, right frontotemporal region enhancement suggesting changes related to acute seizure, possible toxic insult involving the caudate and lentiform nuclei bilaterally.  Cardiology recommended interval follow-up brain MRI.  CTA head and neck without significant finding.  Patient had intermittent seizure activities confirmed on continuous EEG.  Keppra increased to 1000 mg twice daily.  TTE with EF of 10-15%, LV thrombus, moderate RV dysfunction and dilated IVC.   Advanced heart failure team consulted.   Subjective: Seen and examined earlier this morning.  Patient has had intermittent seizure activities since yesterday that was confirmed on continuous EEG.  His Keppra has been increased to 1000 mg twice daily.  He feels better this morning.  Still with some tingling in his left face, right arm and right leg but  improved.  He denies cardiopulmonary symptoms.  Patient's wife at bedside.  Also discussed with patient's sister who is an Charity fundraiserN over the phone.  Objective: Vitals:   09/13/20 0800 09/13/20 0917 09/13/20 1100 09/13/20 1155  BP:  110/72  (!) 126/92  Pulse: (!) 109 72  100  Resp: 20 20  20   Temp:  98.2 F (36.8 C)  98.3 F (36.8 C)  TempSrc:  Oral  Oral  SpO2: 96% 97%  100%  Weight:   64.5 kg   Height:   5\' 11"  (1.803 m)    No intake or output data in the 24 hours ending 09/13/20 1434 Filed Weights   09/13/20 1100  Weight: 64.5 kg    Examination:  GENERAL: No apparent distress.  Nontoxic. HEENT: MMM.  Vision and hearing grossly intact.  NECK: Supple.  No apparent JVD.  RESP: On RA.  No IWOB.  Fair aeration bilaterally. CVS:  RRR. Heart sounds normal.  ABD/GI/GU: BS+. Abd soft, NTND.  MSK/EXT:  Moves extremities. No apparent deformity. No edema.  SKIN: no apparent skin lesion or wound NEURO: Awake and alert. Oriented appropriately.  No apparent focal neuro deficit other than diminished light sensation and slight weakness in the left. PSYCH: Calm. Normal affect.  Procedures:  None  Microbiology summarized: COVID-19 PCR negative.  Assessment & Plan: Seizures: Intermittent seizure activity confirmed on continuous EEG.  Likely due to CVA. -Neurology managing-increase Keppra to 1000 mg twice daily and added Dilantin -Seizure and fall precautions -Continue PT/OT/SLP  Subacute hemorrhagic CVA-presented with left arm and face tingling.  Noted to have some dysarthria.  Has some left facial droop and residual motor and sensory deficit  on the left. CT and MRI with probable subacute hemorrhagic infarct in right frontal and left parietal occipital areas. CTA head and neck basically negative.  He is noncompliant.  A lot of risk factors.  LDL is 305.  TTE with severely reduced LVEF and LV thrombus -Neurology managing-on IV heparin, low-dose aspirin and high intensity statin -Appreciate  PT/OT/SLP eval   Uncontrolled IDDM-2 with HHS and hyperlipidemia: A1c> 15.5% Recent Labs  Lab 09/12/20 1551 09/12/20 2136 09/13/20 0617 09/13/20 0854 09/13/20 1152  GLUCAP 314* 181* 206* 181* 234*  -Increase Lantus from 25 to 30 units twice daily starting tonight -Increase NovoLog from 5 to 7 units 3 times daily with meals -Continue SSI/moderate -High intensity statin as above   Chronic systolic CHF/LV thrombus: TTE with EF of 10-15%, LV thrombus, moderate RV dysfunction and dilated IVC. He has not had subsequent TTE or follow-up.  Supposed to be on Coreg and Entresto but not compliant.  He is not on diuretics.  Appears euvolemic on exam. -Advanced heart failure team managing. -Started on warfarin with heparin bridge for LV thrombus -Started on Lasix and Aldactone -High intensity statin with Zetia -GDMT-Coreg, Aldactone -Closely monitor fluid and respiratory status   Essential hypertension/sinus tachycardia: BP was elevated on arrival but normalized -Cardiac meds as above  Noncompliance -Counseled on this multiple times  History of COVID-19 infection -COVID-19 PCR negative now  Body mass index is 19.85 kg/m.         DVT prophylaxis:  Place and maintain sequential compression device Start: 09/12/20 0141 warfarin (COUMADIN) tablet 5 mg  Code Status: Full code Family Communication: Updated patient's wife at bedside and patient's sister over the phone Level of care: Progressive  Status is: Inpatient  Remains inpatient appropriate because:Hemodynamically unstable, Ongoing diagnostic testing needed not appropriate for outpatient work up, Unsafe d/c plan, IV treatments appropriate due to intensity of illness or inability to take PO and Inpatient level of care appropriate due to severity of illness   Dispo: The patient is from: Home              Anticipated d/c is to: Home              Patient currently is not medically stable to d/c.   Difficult to place patient  No            Consultants:  Neurology Advanced heart failure team   Sch Meds:  Scheduled Meds: .  stroke: mapping our early stages of recovery book   Does not apply Once  . [START ON 09/14/2020] aspirin EC  81 mg Oral Daily  . atorvastatin  80 mg Oral Daily  . carvedilol  3.125 mg Oral BID WC  . ezetimibe  10 mg Oral Daily  . furosemide  20 mg Oral Daily  . insulin aspart  0-15 Units Subcutaneous TID WC  . insulin aspart  0-5 Units Subcutaneous QHS  . insulin aspart  5 Units Subcutaneous TID WC  . insulin detemir  25 Units Subcutaneous BID  . levETIRAcetam  1,000 mg Oral BID  . living well with diabetes book   Does not apply Once  . perflutren lipid microspheres (DEFINITY) IV suspension      . phenytoin  100 mg Oral TID  . spironolactone  12.5 mg Oral Daily  . warfarin  5 mg Oral ONCE-1600  . Warfarin - Pharmacist Dosing Inpatient   Does not apply q1600   Continuous Infusions: . heparin    . magnesium sulfate bolus  IVPB     PRN Meds:.acetaminophen **OR** acetaminophen (TYLENOL) oral liquid 160 mg/5 mL **OR** acetaminophen, dextrose  Antimicrobials: Anti-infectives (From admission, onward)   None       I have personally reviewed the following labs and images: CBC: Recent Labs  Lab 09/11/20 2019 09/11/20 2037  WBC 5.2  --   HGB 14.5 15.0  HCT 42.0 44.0  MCV 83.0  --   PLT 411*  --    BMP &GFR Recent Labs  Lab 09/11/20 2019 09/11/20 2037 09/11/20 2039 09/11/20 2159 09/12/20 0201 09/12/20 1035 09/13/20 0255  NA 127* 130*  --  128* 135 139 132*  K 4.8 4.9  --  4.9 3.9 3.7 4.0  CL 88*  --   --  90* 98 101 94*  CO2 26  --   --  27 26 28 25   GLUCOSE 797*  --   --  707* 340* 119* 186*  BUN 15  --   --  15 13 12 15   CREATININE 1.18  --   --  1.14 0.90 0.91 0.87  CALCIUM 9.6  --   --  9.4 9.6 9.7 9.3  MG  --   --  2.3  --   --   --  1.8  PHOS  --   --   --   --   --   --  4.3   Estimated Creatinine Clearance: 106.1 mL/min (by C-G formula based on  SCr of 0.87 mg/dL). Liver & Pancreas: Recent Labs  Lab 09/11/20 2019 09/13/20 0255  AST 19  --   ALT 17  --   ALKPHOS 70  --   BILITOT 0.7  --   PROT 8.3*  --   ALBUMIN 3.8 3.1*   No results for input(s): LIPASE, AMYLASE in the last 168 hours. No results for input(s): AMMONIA in the last 168 hours. Diabetic: No results for input(s): HGBA1C in the last 72 hours. Recent Labs  Lab 09/12/20 1551 09/12/20 2136 09/13/20 0617 09/13/20 0854 09/13/20 1152  GLUCAP 314* 181* 206* 181* 234*   Cardiac Enzymes: Recent Labs  Lab 09/11/20 2039  CKTOTAL 67   No results for input(s): PROBNP in the last 8760 hours. Coagulation Profile: No results for input(s): INR, PROTIME in the last 168 hours. Thyroid Function Tests: No results for input(s): TSH, T4TOTAL, FREET4, T3FREE, THYROIDAB in the last 72 hours. Lipid Profile: Recent Labs    09/12/20 0201  CHOL 380*  HDL 45  LDLCALC 305*  TRIG 149  CHOLHDL 8.4   Anemia Panel: No results for input(s): VITAMINB12, FOLATE, FERRITIN, TIBC, IRON, RETICCTPCT in the last 72 hours. Urine analysis:    Component Value Date/Time   COLORURINE STRAW (A) 09/11/2020 2014   APPEARANCEUR CLEAR 09/11/2020 2014   LABSPEC 1.031 (H) 09/11/2020 2014   PHURINE 5.0 09/11/2020 2014   GLUCOSEU >=500 (A) 09/11/2020 2014   HGBUR NEGATIVE 09/11/2020 2014   BILIRUBINUR NEGATIVE 09/11/2020 2014   BILIRUBINUR neg 04/17/2016 1134   KETONESUR 5 (A) 09/11/2020 2014   PROTEINUR NEGATIVE 09/11/2020 2014   UROBILINOGEN 1.0 04/17/2016 1134   UROBILINOGEN 1.0 11/05/2012 1608   NITRITE NEGATIVE 09/11/2020 2014   LEUKOCYTESUR NEGATIVE 09/11/2020 2014   Sepsis Labs: Invalid input(s): PROCALCITONIN, LACTICIDVEN  Microbiology: Recent Results (from the past 240 hour(s))  SARS CORONAVIRUS 2 (TAT 6-24 HRS) Nasopharyngeal Nasopharyngeal Swab     Status: None   Collection Time: 09/12/20 12:57 AM   Specimen: Nasopharyngeal Swab  Result Value Ref Range  Status   SARS  Coronavirus 2 NEGATIVE NEGATIVE Final    Comment: (NOTE) SARS-CoV-2 target nucleic acids are NOT DETECTED.  The SARS-CoV-2 RNA is generally detectable in upper and lower respiratory specimens during the acute phase of infection. Negative results do not preclude SARS-CoV-2 infection, do not rule out co-infections with other pathogens, and should not be used as the sole basis for treatment or other patient management decisions. Negative results must be combined with clinical observations, patient history, and epidemiological information. The expected result is Negative.  Fact Sheet for Patients: HairSlick.no  Fact Sheet for Healthcare Providers: quierodirigir.com  This test is not yet approved or cleared by the [place] FDA and  has been authorized for detection and/or diagnosis of SARS-CoV-2 by FDA under an Emergency Use Authorization (EUA). This EUA will remain  in effect (meaning this test can be used) for the duration of the COVID-19 declaration under Se ction 564(b)(1) of the Act, 21 U.S.C. section 360bbb-3(b)(1), unless the authorization is terminated or revoked sooner.  Performed at [facility], [address] [place], [place] [address]     Radiology Studies: Overnight EEG with video  Result Date: 09/13/2020 Charlsie Quest, MD     09/13/2020  9:45 AM Patient Name: Jason Stevenson MRN: [id number] Epilepsy Attending: Charlsie Quest Referring Physician/Provider: Dr Eligha Bridegroom Duration: 09/12/2020 1901 to 09/13/2020 0930  Patient history: 35 year old male presenting with new onset of left sided twitching, left facial droop and severe hyperglycemia. EEG to evaluate for seizure.  Level of alertness: Awake, asleep  AEDs during EEG study: None  Technical aspects: This EEG study was done with scalp electrodes positioned according to the 10-20 International system of electrode placement. Electrical activity  was acquired at a sampling rate of 500Hz  and reviewed with a high frequency filter of 70Hz  and a low frequency filter of 1Hz . EEG data were recorded continuously and digitally stored.  Description: The posterior dominant rhythm consists of 8-9 Hz activity of moderate voltage (25-35 uV) seen predominantly in posterior head regions, symmetric and reactive to eye opening and eye closing.  Sleep was characterized by orbicularis, sleep spindles (12 to 14 Hz), maximum frontocentral region.  Intermittent sharply contoured 2 to 3 Hz delta slowing was also noted in left frontotemporal region. Seizures without clinical signs were seen arising from right temporo-parietal region, average 4-5/hour, average duration about 30 seconds. Hyperventilation and photic stimulation were not performed.   ABNORMALITY -Seizure without clinical signs, right temporo-parietal region -Intermittent slow, left frontotemporal region  IMPRESSION: This study showed seizures without clinical signs arising from right temporo-parietal region, average 4-5/hour, average duration about 30 seconds.  There is also evidence of nonspecific cortical dysfunction in left frontotemporal region. Dr. Otelia Limes and Dr. Roda Shutters were intermittently notified  Charlsie Quest   ECHOCARDIOGRAM COMPLETE  Result Date: 09/13/2020    ECHOCARDIOGRAM REPORT   Patient Name:   Jason Stevenson Date of Exam: 09/13/2020 Medical Rec #:  [id number]         Height:       71.0 in Accession #:    [id number]        Weight:       167.0 lb Date of Birth:  01-24-1986          BSA:          1.953 m Patient Age:    35 years          BP:  102/79 mmHg Patient Gender: M                 HR:           111 bpm. Exam Location:  Inpatient Procedure: 2D Echo, 3D Echo, Cardiac Doppler, Color Doppler and Intracardiac            Opacification Agent REPORT CONTAINS CRITICAL RESULT Indications:    Stroke  History:        Patient has prior history of Echocardiogram examinations.                  Cardiomyopathy and CHF, Abnormal ECG, Stroke; Risk                 Factors:Hypertension, Diabetes and Dyslipidemia.  Sonographer:    Sheralyn Boatman RDCS Referring Phys: [id number] [facility]  Sonographer Comments: Technically difficult study due to poor echo windows. Patient supine on EEG monitor. Patient started to have siezure like activity during image 104. RN notified. IMPRESSIONS  1. 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  2. Left ventricular ejection fraction, by estimation, is 10-15%. The left ventricle has severely decreased function. The left ventricle demonstrates global hypokinesis. The left ventricular internal cavity size was mildly dilated. Left ventricular diastolic parameters are indeterminate.  3. Right ventricular systolic function is moderately reduced. The right ventricular size is normal. Tricuspid regurgitation signal is inadequate for assessing PA pressure.  4. Left atrial size was mildly dilated.  5. The mitral valve is grossly normal. Mild mitral valve regurgitation. No evidence of mitral stenosis.  6. The aortic valve is tricuspid. Aortic valve regurgitation is not visualized. No aortic stenosis is present.  7. The inferior vena cava is dilated in size with <50% respiratory variability, suggesting right atrial pressure of 15 mmHg. Conclusion(s)/Recommendation(s): Critical findings reported to Dr. Geraldo Docker and acknowledged at 9:15 am 09/13/20. FINDINGS  Left Ventricle: Left ventricular ejection fraction, by estimation, is 10-15%. The left ventricle has severely decreased function. The left ventricle demonstrates global hypokinesis. Definity contrast agent was given IV to delineate the left ventricular endocardial borders. The left ventricular internal cavity size was mildly dilated. There is no left ventricular hypertrophy. Left ventricular diastolic parameters are indeterminate. Right Ventricle: The right ventricular size is normal. No increase in right ventricular wall thickness.  Right ventricular systolic function is moderately reduced. Tricuspid regurgitation signal is inadequate for assessing PA pressure. Left Atrium: Left atrial size was mildly dilated. Right Atrium: Right atrial size was normal in size. Pericardium: There is no evidence of pericardial effusion. Mitral Valve: The mitral valve is grossly normal. Mild mitral valve regurgitation. No evidence of mitral valve stenosis. Tricuspid Valve: The tricuspid valve is normal in structure. Tricuspid valve regurgitation is trivial. No evidence of tricuspid stenosis. Aortic Valve: The aortic valve is tricuspid. Aortic valve regurgitation is not visualized. No aortic stenosis is present. Pulmonic Valve: The pulmonic valve was normal in structure. Pulmonic valve regurgitation is not visualized. No evidence of pulmonic stenosis. Aorta: The aortic root is normal in size and structure. Venous: The inferior vena cava is dilated in size with less than 50% respiratory variability, suggesting right atrial pressure of 15 mmHg. IAS/Shunts: No atrial level shunt detected by color flow Doppler.  LEFT VENTRICLE PLAX 2D LVIDd:         6.00 cm LVIDs:         5.50 cm LV PW:         0.90 cm LV IVS:  0.90 cm LVOT diam:     1.90 cm LV SV:         30 LV SV Index:   16 LVOT Area:     2.84 cm  LV Volumes (MOD) LV vol d, MOD A2C: 83.7 ml LV vol d, MOD A4C: 202.0 ml LV vol s, MOD A2C: 68.0 ml LV vol s, MOD A4C: 187.0 ml LV SV MOD A2C:     15.7 ml LV SV MOD A4C:     202.0 ml LV SV MOD BP:      18.0 ml RIGHT VENTRICLE            IVC RV S prime:     7.67 cm/s  IVC diam: 2.05 cm TAPSE (M-mode): 1.7 cm LEFT ATRIUM             Index       RIGHT ATRIUM           Index LA diam:        3.00 cm 1.54 cm/m  RA Area:     12.90 cm LA Vol (A2C):   30.6 ml 15.67 ml/m RA Volume:   32.10 ml  16.44 ml/m LA Vol (A4C):   48.0 ml 24.58 ml/m LA Biplane Vol: 40.8 ml 20.89 ml/m  AORTIC VALVE LVOT Vmax:   79.90 cm/s LVOT Vmean:  55.800 cm/s LVOT VTI:    0.107 m  AORTA Ao Root  diam: 3.00 cm MITRAL VALVE MV Area (PHT): 5.60 cm     SHUNTS MV Decel Time: 136 msec     Systemic VTI:  0.11 m MV E velocity: 125.00 cm/s  Systemic Diam: 1.90 cm Weston Brass MD Electronically signed by Weston Brass MD Signature Date/Time: 09/13/2020/9:15:56 AM    Final       Gianlucas Evenson T. Briggette Najarian [facility]  If 7PM-7AM, please contact night-coverage [email or web address] 09/13/2020, 2:34 PM

## 2020-09-13 NOTE — Evaluation (Signed)
Speech Language Pathology Evaluation Patient Details Name: Carlitos Bottino MRN: [id number] DOB: 27-Oct-1985 Today's Date: 09/13/2020 Time: 1445-1500 SLP Time Calculation (min) (ACUTE ONLY): 15 min  Problem List:  Patient Active Problem List   Diagnosis Date Noted  . Neurological deficit present 09/12/2020  . History of COVID-19 09/12/2020  . Intracerebral hemorrhage 09/12/2020  . Acute cerebrovascular accident (CVA) (HCC) 09/12/2020  . Hyperosmolar hyperglycemic state (HHS) (HCC) 09/11/2020  . COVID-19 08/23/2020  . Noncompliance with medication treatment due to intermittent use of medication 10/12/2018  . Dyslipidemia 04/17/2016  . Uncontrolled type 2 diabetes mellitus without complication, with long-term current use of insulin 08/07/2015  . Chronic systolic CHF (congestive heart failure) (HCC) 06/18/2015  . Needs flu shot 12/23/2013  . Type 2 diabetes mellitus (HCC) 11/18/2013  . Diabetes (HCC) 06/17/2013  . Non-ischemic cardiomyopathy (HCC) 11/06/2012  . HTN (hypertension) 11/06/2012  . HLD (hyperlipidemia) 11/06/2012   Past Medical History:  Past Medical History:  Diagnosis Date  . Chronic systolic CHF (congestive heart failure) (HCC) 06/18/2015  . Diabetes mellitus without complication (HCC)   . Hyperlipidemia   . Hypertension   . Nonischemic cardiomyopathy (HCC) Noted as early as 2007   Per chart review (cards consult note 2011), EF of 40% in 2007, down to 20-25% in 2011   Past Surgical History:  Past Surgical History:  Procedure Laterality Date  . None     HPI:  35 y/o male admitted secondary to increased L sided twitching and tingling. Workup for possible seizure. MRI+ subacute hemorrhagic infarcts involving the anterior  right frontal lobe and left parieto-occipital cortex as above, with  additional chronic right parietal infarct. Pt also found to be in Hyperosmolar hyperglycemic state. He has had intermittent seizure activities confirmed on continuous EEG.PMH includes  HTN, DM, CHF, nonischemic cardiomyopathy.   Assessment / Plan / Recommendation Clinical Impression  Pt presents with clear, fluent speech without further dysarthria.  Expressive and receptive language are intact.  He demonstrated intermittent delays in processing, as well as difficulties with selective and alternating attention.  SLP will follow for further cognitive assessment and to determine f/u needs post-D/C. Pt/wife agree with plan.    SLP Assessment  SLP Recommendation/Assessment: Patient needs continued Speech Lanaguage Pathology Services SLP Visit Diagnosis: Cognitive communication deficit (R41.841)    Follow Up Recommendations  Outpatient SLP    Frequency and Duration min 2x/week  2 weeks      SLP Evaluation Cognition  Overall Cognitive Status: Impaired/Different from baseline Arousal/Alertness: Awake/alert Orientation Level: Oriented X4 Attention: Selective;Alternating Selective Attention: Impaired Selective Attention Impairment: Verbal basic Alternating Attention: Impaired Alternating Attention Impairment: Verbal basic Memory: Appears intact Awareness: Impaired Awareness Impairment: Anticipatory impairment       Comprehension  Auditory Comprehension Overall Auditory Comprehension: Appears within functional limits for tasks assessed Reading Comprehension Reading Status: Within funtional limits    Expression Expression Primary Mode of Expression: Verbal Verbal Expression Overall Verbal Expression: Appears within functional limits for tasks assessed Written Expression Dominant Hand: Right Written Expression: Not tested   Oral / Motor  Oral Motor/Sensory Function Overall Oral Motor/Sensory Function: Within functional limits Motor Speech Overall Motor Speech: Appears within functional limits for tasks assessed   GO                    Blenda Mounts Laurice 09/13/2020, 3:14 PM   Kyndall Amero L. Samson Frederic, MA CCC/SLP Acute Rehabilitation Services Office number  [phone] Pager [phone]

## 2020-09-14 ENCOUNTER — Other Ambulatory Visit: Payer: Self-pay

## 2020-09-14 ENCOUNTER — Telehealth (HOSPITAL_COMMUNITY): Payer: Self-pay | Admitting: Licensed Clinical Social Worker

## 2020-09-14 LAB — BASIC METABOLIC PANEL
Anion gap: 10 (ref 5–15)
BUN: 14 mg/dL (ref 6–20)
CO2: 28 mmol/L (ref 22–32)
Calcium: 9.4 mg/dL (ref 8.9–10.3)
Chloride: 98 mmol/L (ref 98–111)
Creatinine, Ser: 0.85 mg/dL (ref 0.61–1.24)
GFR, Estimated: >60 mL/min (ref >60)
Glucose, Bld: 137 mg/dL — ABNORMAL HIGH (ref 70–99)
Potassium: 3.9 mmol/L (ref 3.5–5.1)
Sodium: 136 mmol/L (ref 135–145)

## 2020-09-14 LAB — CBC
HCT: 44.4 % (ref 39.0–52.0)
Hemoglobin: 15.3 g/dL (ref 13.0–17.0)
MCH: 28.8 pg (ref 26.0–34.0)
MCHC: 34.5 g/dL (ref 30.0–36.0)
MCV: 83.6 fL (ref 80.0–100.0)
Platelets: 355 10*3/uL (ref 150–400)
RBC: 5.31 MIL/uL (ref 4.22–5.81)
RDW: 11.7 % (ref 11.5–15.5)
WBC: 5.7 10*3/uL (ref 4.0–10.5)
nRBC: 0 % (ref 0.0–0.2)

## 2020-09-14 LAB — GLUCOSE, CAPILLARY
Glucose-Capillary: 128 mg/dL — ABNORMAL HIGH (ref 70–99)
Glucose-Capillary: 131 mg/dL — ABNORMAL HIGH (ref 70–99)
Glucose-Capillary: 246 mg/dL — ABNORMAL HIGH (ref 70–99)
Glucose-Capillary: 75 mg/dL (ref 70–99)

## 2020-09-14 LAB — PROTIME-INR
INR: 1 (ref 0.8–1.2)
Prothrombin Time: 13.1 seconds (ref 11.4–15.2)

## 2020-09-14 LAB — HEPARIN LEVEL (UNFRACTIONATED)
Heparin Unfractionated: 0.13 IU/mL — ABNORMAL LOW (ref 0.30–0.70)
Heparin Unfractionated: 0.33 IU/mL (ref 0.30–0.70)

## 2020-09-14 LAB — PHENYTOIN LEVEL, TOTAL: Phenytoin Lvl: 21 ug/mL — ABNORMAL HIGH (ref 10.0–20.0)

## 2020-09-14 MED ORDER — LOSARTAN POTASSIUM 25 MG PO TABS
12.5000 mg | ORAL_TABLET | Freq: Every day | ORAL | Status: DC
  Administered 2020-09-14 – 2020-09-19 (×6): 12.5 mg via ORAL
  Filled 2020-09-14 (×6): qty 1

## 2020-09-14 MED ORDER — WARFARIN SODIUM 7.5 MG PO TABS
7.5000 mg | ORAL_TABLET | Freq: Once | ORAL | Status: AC
  Administered 2020-09-14: 7.5 mg via ORAL
  Filled 2020-09-14: qty 1

## 2020-09-14 MED ORDER — METOPROLOL TARTRATE 5 MG/5ML IV SOLN
2.5000 mg | Freq: Once | INTRAVENOUS | Status: AC
  Administered 2020-09-14: 2.5 mg via INTRAVENOUS
  Filled 2020-09-14: qty 5

## 2020-09-14 MED ORDER — OXCARBAZEPINE 300 MG PO TABS
600.0000 mg | ORAL_TABLET | Freq: Two times a day (BID) | ORAL | Status: DC
  Administered 2020-09-14 – 2020-09-19 (×11): 600 mg via ORAL
  Filled 2020-09-14 (×11): qty 2

## 2020-09-14 MED ORDER — WARFARIN SODIUM 5 MG PO TABS: 5.0000 mg | ORAL_TABLET | Freq: Once | ORAL | Status: DC

## 2020-09-14 MED ORDER — ONDANSETRON HCL 4 MG/2ML IJ SOLN
4.0000 mg | Freq: Three times a day (TID) | INTRAMUSCULAR | Status: DC | PRN
  Administered 2020-09-14: 4 mg via INTRAVENOUS
  Filled 2020-09-14: qty 2

## 2020-09-14 MED ORDER — CARVEDILOL 6.25 MG PO TABS: 6.2500 mg | ORAL_TABLET | Freq: Two times a day (BID) | ORAL | Status: DC

## 2020-09-14 NOTE — Progress Notes (Signed)
   09/14/20 2253  Provider Notification  Provider Name/Title Dr Iona Coach  Date Provider Notified 09/14/20  Time Provider Notified 2253  Notification Type Page  Notification Reason Other (Comment) (Pt's HR still in the 130s)  Provider response See new orders  Date of Provider Response 09/14/20  Time of Provider Response 2325

## 2020-09-14 NOTE — Progress Notes (Signed)
STROKE TEAM PROGRESS NOTE   INTERVAL HISTORY Wife at bedside. Pt lying in bed, awake alert and interactive, smiling and greeting to me. He stated that he felt his left arm numbness episodes are gone. I told him that the LTM EEG still showing seizure episodes but much less than before. I will add trileptal for seizure control. On heparin IV and warfarin. INR 1.0 today.    Vitals:   09/14/20 0011 09/14/20 0457 09/14/20 0809 09/14/20 1122  BP: 108/81 (!) 113/98 (!) 108/91 114/83  Pulse: (!) 108 (!) 109 (!) 105 (!) 109  Resp: 15 16 19 19   Temp: 98.6 F (37 C) 98.3 F (36.8 C) 98.5 F (36.9 C) 98.4 F (36.9 C)  TempSrc: Oral Oral Oral Oral  SpO2: 99%  96% 97%  Weight:      Height:       CBC:  Recent Labs  Lab 09/11/20 2019 09/11/20 2037 09/14/20 0650  WBC 5.2  --  5.7  HGB 14.5 15.0 15.3  HCT 42.0 44.0 44.4  MCV 83.0  --  83.6  PLT 411*  --  355   Basic Metabolic Panel:  Recent Labs  Lab 09/11/20 2039 09/11/20 2159 09/13/20 0255 09/14/20 0650  NA  --    < > 132* 136  K  --    < > 4.0 3.9  CL  --    < > 94* 98  CO2  --    < > 25 28  GLUCOSE  --    < > 186* 137*  BUN  --    < > 15 14  CREATININE  --    < > 0.87 0.85  CALCIUM  --    < > 9.3 9.4  MG 2.3  --  1.8  --   PHOS  --   --  4.3  --    < > = values in this interval not displayed.   Lipid Panel:  Recent Labs  Lab 09/12/20 0201  CHOL 380*  TRIG 149  HDL 45  CHOLHDL 8.4  VLDL 30  LDLCALC 09/14/20*   HgbA1c:  Recent Labs  Lab 09/12/20 0202  HGBA1C >15.5*   Urine Drug Screen:  Recent Labs  Lab 09/12/20 1348  LABOPIA NONE DETECTED  COCAINSCRNUR NONE DETECTED  LABBENZ NONE DETECTED  AMPHETMU NONE DETECTED  THCU NONE DETECTED  LABBARB NONE DETECTED    Alcohol Level No results for input(s): ETH in the last 168 hours.  IMAGING past 24 hours  CT ANGIO HEAD NECK W WO CM Result Date: 09/12/2020 IMPRESSION: No large vessel occlusion or significant proximal stenosis in the head and neck.   CT Head Wo  Contrast Result Date: 09/11/2020 IMPRESSION:  1. No acute intracranial hemorrhage.  2. Probable subacute infarct in the right frontal lobe. Further evaluation with MRI is recommended. Old right parietal infarct.   MR Brain W and Wo Contrast Result Date: 09/12/2020 IMPRESSION:  1. Probable subacute hemorrhagic infarcts involving the anterior right frontal lobe and left parieto-occipital cortex as above, with additional chronic right parietal infarct.  2. Superimposed gyriform diffusion abnormality and enhancement involving the right frontotemporal region, favored to reflect changes of acute seizure. Correlation with EEG recommended. Possible ischemic change would be the primary differential consideration.  3. Abnormal symmetric T2/FLAIR signal abnormality involving the caudate and lentiform nuclei bilaterally, nonspecific, but most suggestive of superimposed/concomitant toxic metabolic derangement or other toxic insult. Correlation with history and laboratory values recommended.  4. A short interval follow-up  brain MRI to ensure that these changes resolve is recommended.   EEG adult Result Date: 09/12/2020 IMPRESSION: This study is within normal limits. No seizures or epileptiform discharges were seen throughout the recording. Priyanka Annabelle Harman    PHYSICAL EXAM HEENT-  [place]/AT.   Lungs- Respirations unlabored Cardiovascular- regular rate and rhythm Extremities- No edema Neuro - awake, alert, fully orientated x3.  No aphasia, fluent language, follows some commands, able to name and repeat. No gaze palsy, tracking bilaterally, visual field full, PERRL. No facial droop. Tongue midline. Bilateral UEs 5/5, no drift. Bilaterally LEs 5/5, no drift. Sensation symmetrical bilaterally, b/l FTN intact, gait not tested.   ASSESSMENT/PLAN Mr. Amor Packard is a 35 y.o. male with history of  DM2, nonischemic cardiomyopathy, chronic systolic CHF, HLD and HTN who presents to the ED with an 8 day history of  intermittent left sided twitching, left facial droop, and severe hyperglycemia.   Seizure   Intermittent left sided twitching x1 week  MRI: Superimposed gyriform diffusion abnormality and enhancement involving the right frontotemporal region, favored to reflect changes of acute seizure.  Repeat MRI in 1 week  Spot EEG: no seizures or epileptiform dischages  LTM EEG 6/1 - seizures without clinical signs arising from right temporo-parietal region, average 4-5/hour, average duration about 30 seconds.  There is also evidence of nonspecific cortical dysfunction in left frontotemporal region.  LTM EEG 6/2 - seizures without clinical signs arising from right temporo-parietal region,average1-2/hour, average duration about 30 seconds  Continue Keppra 1 g q 12 hrs  Continue Dilantin 100mg  Q8h  Add trileptal 600mg  bid  Dilantin level 21.0->  LV thrombus Severe cardiomyopathy  Echo July 2020 showed EF 15-20%, LV diffuse hypokinesis. No significant change from  May 2017  On coreg and entresto 49-51mg  bid PTA, but not compliance  This admission 2D echo showed LV thrombus 0.9 x 0.9, EF 10 to 15%.  Heart failure team on board  Currently on aspirin 81 and heparin IV bridge to Coumadin with INR goal 2 to 3  On Lasix, Coreg and spironolactone  Medication compliance again emphasized  Chronic / subacute strokes:  Probable chronic/subacute hemorrhagic infarcts involving the anterior right frontal lobe and left parieto-occipital cortex due to LV thrombus and cardiomyopathy with low EF  CT head No acute abnormality. probable subacute infarct in the right frontal lobe  CTA head & neck: no large vessel occlusion or significant proximal stenosis in the head and neck.   MRI: probable subacute hemorrhagic infarcts involving the anterior right frontal lobe and left parieto-occipital cortex  2D Echo EF 10 to 50% with LV thrombus  SARS coronavirus negative  LDL 305  HgbA1c > 15.5  UDS  neg  VTE prophylaxis - scds Diet: heart healthy  No antithrombotic prior to admission, now on aspirin 81 mg daily and heparin IV bridged to Coumadin.   Therapy recommendations:  none  Disposition:  pending  Hypertension  Home meds:  Coreg 9.375mg  bid  Stable  Now on Coreg, Lasix, spironolactone . Long-term BP goal normotensive  Hyperlipidemia  Home meds:  crestor 40mg  daily, not compliant   LDL 305, goal < 70  Add Lipitor 80 mg daily   High intensity statin   Continue statin at discharge  Diabetes type II Uncontrolled/DKA  Home meds:  Glipizide 10mg  bid, levemir 30 units daily, lispro ss, metformin 500mg  bid - noncompliance  stopped checking his fingerstick glucose readings about 2 weeks ago  blood glucose was > 600 in the ED  Insulin  drip -> off  Diabetes coordinator: recommendations for transition  Levemir 18 units 2 hours prior to discontinuing IV insulin  Novolog 0-15 units TID and 0-5 units QHS  Novolog 3 units TID with meals if eats at least 50%  HgbA1c > 15.5, goal < 7.0  CBGs  SSI  Other Stroke Risk Factors  Prior Marijuana use. Quit 2013. UDS negative  ETOH use, advised to drink no more than 1 drink per day  Family hx stroke (aunt)  Coronary artery disease: nonischemic cardiomyopathy  Other Active Problems  Chronic pain: takes tylenol, and ibuprofen at home at home  Neuropathy: takes gabapentin  Hospital day # 2  Marvel Plan, MD PhD Stroke Neurology 09/14/2020 1:14 PM  To contact Stroke Continuity provider, please refer to [email or web address]. After hours, contact General Neurology

## 2020-09-14 NOTE — Progress Notes (Signed)
vLTM EEG maintenance complete. No skin breakdown at FP1 FP2 P3 changed head wrap and ekgs

## 2020-09-14 NOTE — Progress Notes (Signed)
HOSPITAL MEDICINE OVERNIGHT EVENT NOTE    Nursing reports that since the patient went to the bathroom earlier this afternoon patient has developed severe tachycardia with heart rate in the 130s.  Bedside telemetry reports sinus tachycardia.  This is despite the patient already receiving a dose of metoprolol and Coreg this afternoon/evening.  Per my discussion with nursing the patient is not experiencing any respiratory distress, chest pain, anxiety.  Patient is not exhibiting any fever and does not appear to be volume depleted.  Patient is not in any pain.  The cause of the patient's sinus tachycardia is not clear -although the patient does possess advanced congestive heart failure which alone can cause some degree of tachycardia.  I do not believe further AV nodal blocking agents is necessary at this time.  We will obtain an EKG to confirm the rhythm, BMP and magnesium to evaluate the electrolytes, urine toxicology screen and TSH.  Marinda Elk  MD [facility]

## 2020-09-14 NOTE — TOC Progression Note (Signed)
Transition of Care (TOC) - Progression Note  Heart Failure   Patient Details  Name: Jason Stevenson MRN: [id number] Date of Birth: 02-28-1986  Transition of Care [facility]) CM/SW Contact  Mackinze Criado, LCSWA Phone Number: 09/14/2020, 5:27 PM  Clinical Narrative:    CSW spoke with the patient and his wife at bedside to follow up regarding discussion yesterday. CSW obtained the patients signature for the disability and Food Stamp referral for the [facility] to reach out to them and patient and spouse are both in agreement to move forward with referrals. CSW provided the patient with an HF booklet for education. CSW answered questions as needed. Mr. Cupp reported that he is okay for the CSW to call and schedule him a hospital follow up once he is ready for discharge. Patients spouse asked about getting an insulin pump and a glucometer and CSW will reach out to the diabetic coordinator for assistance due to patient not having any health insurance.  CSW will continue to follow throughout discharge.    Expected Discharge Plan: Home/Self Care Barriers to Discharge: Continued Medical Work up  Expected Discharge Plan and Services Expected Discharge Plan: Home/Self Care In-house Referral: Clinical Social Work     Living arrangements for the past 2 months: Single Family Home                                       Social Determinants of Health (SDOH) Interventions Food Insecurity Interventions: Assist with [employer] Application Financial Strain Interventions: Other (Comment) (Provided patient with a cafa application) Housing Interventions: Intervention Not Indicated Transportation Interventions: [facility]  Readmission Risk Interventions No flowsheet data found.  Ronak Duquette, MSW, LCSWA [phone] Heart Failure Social Worker

## 2020-09-14 NOTE — Procedures (Addendum)
Patient Name:Marcial Hosick SNK:[id number] Epilepsy Attending:Kriston Mckinnie Annabelle Harman Referring Physician/Provider:Dr Eligha Bridegroom Duration: 09/13/2020 1901 to 09/14/2020 1901  Patient history:35 year old male presenting with new onset of left sided twitching, left facial droop and severe hyperglycemia.EEG to evaluate for seizure.  Level of alertness:Awake, asleep  AEDs during EEG study:LEV, PHT  Technical aspects: This EEG study was done with scalp electrodes positioned according to the 10-20 International system of electrode placement. Electrical activity was acquired at a sampling rate of 500Hz  and reviewed with a high frequency filter of 70Hz  and a low frequency filter of 1Hz . EEG data were recorded continuously and digitally stored.   Description: The posterior dominant rhythm consists of8-9Hz  activity of moderate voltage (25-35 uV) seen predominantly in posterior head regions, symmetric and reactive to eye opening and eye closing.  Sleep was characterized by orbicularis, sleep spindles (12 to 14 Hz), maximum frontocentral region. Intermittent sharply contoured 2 to 3 Hz delta slowing was also noted in left frontotemporal region. Seizures without clinical signs were seen arising from right temporo-parietal region, average 1-2/hour, average duration about 30 seconds,   ABNORMALITY -Seizure without clinical signs, right temporo-parietal region -Intermittent slow, left frontotemporal region  IMPRESSION: This study showed seizures without clinical signs arising from right temporo-parietal region, average 1-2/hour, average duration about 30 seconds. There is also evidence of nonspecific cortical dysfunction in left frontotemporal region.  Randell Detter 

## 2020-09-14 NOTE — Progress Notes (Signed)
CM consulted or medication assistance as pt is uninsured. CM met with the patient and his spouse. They are active with [facility] but using [facility]. CM inquired and they stated they had some issues with [facility] pharmacy in the past. CM called Jane with [facility] pharmacy and she sees where he was approved for the [facility] fund right about the time they changed pharmacies. So he would be getting assist with his meds through their pharmacy. Pt and wife updated. CM also provided them the financial applications for further assistance. They will just need to return these to [facility].  D/c meds should go through [facility] pharmacy unless they are controlled substances. Pt interested in attending outpatient therapy at [facility]. CM will place information on the AVS.  

## 2020-09-14 NOTE — Telephone Encounter (Signed)
Paramedicine Initial Assessment:  Housing:  In what kind of housing do you live? House/apt/trailer/shelter? house  Do you rent/pay a mortgage/own? rent  Do you live with anyone? wife  Are you currently worried about losing your housing? no  Within the past 12 months have you ever stayed outside, in a car, tent, a shelter, or temporarily with someone? no  Within the past 12 months have you been unable to get utilities when it was really needed? No but has been worried about this  Some concerns with future housing costs.  Pt has been unable to work since November due to medical concerns- has been hard for them to live on one income but have been able to manage monthly bills enough to avoid shut off.  CSW encouraged them to reach out if they get shut off notice or are unable to cover bills for one month so we can help.  Social:  What is your current marital status? married  Do you have any children? no  Do you have family or friends who live locally? Main local support is their pastor.  Food:  Within the past 12 months were you ever worried that food would run out before you got money to buy more? no  Within the past 28months have you run out of food and didn't have money to buy more? No  Wife gets over $400 in food stamps- reports no issues with having sufficient food at home.  Income:  What is your current source of income? Pt currently unemployed- they depend on pt's wife's income.  How hard is it for you to pay for the basics like food housing, medical care, and utilities? hard  Do you have outstanding medical bills? yes  Insurance:  Are you currently insured?  No- looked into getting insurance through his wife but was cost prohibitive.  Do you have prescription coverage? No  If no insurance, have you applied for coverage (Medicaid, disability, marketplace etc)? Reportedly applied for medicaid but was granted family planning medicaid which offers no medical  coverage.  Transportation:  Do you have transportation to your medical appointments? Not reliably  In the past 12 months has lack of transportation kept you from medical appts or from getting medications? yes  In the past 12 months has lack of transportation kept you from meetings, work, or getting things you needed? Yes  Depends on his wife for transportation as she has the only car and uses it to get to work.   Daily Health Needs: Do you have a working scale at home? yes  How do you manage your medications at home? Wasn't really taking them prior to admission due to inability to afford.  Do you ever take your medications differently than prescribed? See above  Do you have issues affording your medications? Yes- goes to [facility] but hasn't applied to [facility] to get assistance- CSW informed they need to speak with financial counselor about getting established with [facility] to help with med costs.  Also informed that once he is with the HF Clinic we can potentially help him with getting HF meds and getting him enrolled with medication assistance if he DC's on expensive meds  If yes, has this ever prevented you from obtaining medications? yes  Do you have any concerns with mobility at home? Usually can get around independently  Do you use any assistive devices at home or have PCS at home? no  Do you have a PCP? Yes- Jason Blue  Do you have any trouble reading or writing? no  Are there any additional barriers you see to getting the care you need?  no  CSW will continue to follow through paramedicine program and assist as needed.  Jason Sis, LCSW Clinical Social Worker Advanced Heart Failure Clinic Desk#: [phone] Cell#: [phone]

## 2020-09-14 NOTE — Progress Notes (Addendum)
Advanced Heart Failure Rounding Note  PCP-Cardiologist: Charlton Haws, MD  [facility]: Dr. Shirlee Latch   Subjective:    Feels ok no complaints. Denies dyspnea. No CP. Working w/ PT.   BP stable but sinus tach on tele 110s. Scr 0.85. K 3.9   On heparin/ warfarin. INR 1.0   Objective:   Weight Range: 64.5 kg Body mass index is 19.85 kg/m.   Vital Signs:   Temp:  [97.8 F (36.6 C)-98.6 F (37 C)] 98.5 F (36.9 C) (06/02 0809) Pulse Rate:  [100-131] 105 (06/02 0809) Resp:  [14-20] 19 (06/02 0809) BP: (105-133)/(75-98) 108/91 (06/02 0809) SpO2:  [96 %-100 %] 96 % (06/02 0809) Last BM Date: 09/12/20  Weight change: Filed Weights   09/13/20 1100  Weight: 64.5 kg    Intake/Output:   Intake/Output Summary (Last 24 hours) at 09/14/2020 1113 Last data filed at 09/14/2020 0700 Gross per 24 hour  Intake 353.57 ml  Output 500 ml  Net -146.43 ml      Physical Exam    General:  Well appearing, thin. No resp difficulty HEENT: Normal Neck: Supple. JVP not elevated . Carotids 2+ bilat; no bruits. No lymphadenopathy or thyromegaly appreciated. Cor: PMI nondisplaced. Regular rhythm, mildly tachy rate. No rubs, gallops or murmurs. Lungs: Clear Abdomen: Soft, nontender, nondistended. No hepatosplenomegaly. No bruits or masses. Good bowel sounds. Extremities: No cyanosis, clubbing, rash, edema Neuro: Alert & orientedx3, cranial nerves grossly intact. moves all 4 extremities w/o difficulty. Affect pleasant   Telemetry   Sinus tach 110s   EKG    No new EKG to review   Labs    CBC Recent Labs    09/11/20 2019 09/11/20 2037 09/14/20 0650  WBC 5.2  --  5.7  HGB 14.5 15.0 15.3  HCT 42.0 44.0 44.4  MCV 83.0  --  83.6  PLT 411*  --  355   Basic Metabolic Panel Recent Labs    95/28/41 2039 09/11/20 2159 09/13/20 0255 09/14/20 0650  NA  --    < > 132* 136  K  --    < > 4.0 3.9  CL  --    < > 94* 98  CO2  --    < > 25 28  GLUCOSE  --    < > 186* 137*  BUN  --    < > 15  14  CREATININE  --    < > 0.87 0.85  CALCIUM  --    < > 9.3 9.4  MG 2.3  --  1.8  --   PHOS  --   --  4.3  --    < > = values in this interval not displayed.   Liver Function Tests Recent Labs    09/11/20 2019 09/13/20 0255  AST 19  --   ALT 17  --   ALKPHOS 70  --   BILITOT 0.7  --   PROT 8.3*  --   ALBUMIN 3.8 3.1*   No results for input(s): LIPASE, AMYLASE in the last 72 hours. Cardiac Enzymes Recent Labs    09/11/20 2039  CKTOTAL 67    BNP: BNP (last 3 results) No results for input(s): BNP in the last 8760 hours.  ProBNP (last 3 results) No results for input(s): PROBNP in the last 8760 hours.   D-Dimer No results for input(s): DDIMER in the last 72 hours. Hemoglobin A1C Recent Labs    09/12/20 0202  HGBA1C >15.5*   Fasting Lipid Panel  Recent Labs    09/12/20 0201  CHOL 380*  HDL 45  LDLCALC 305*  TRIG 149  CHOLHDL 8.4   Thyroid Function Tests No results for input(s): TSH, T4TOTAL, T3FREE, THYROIDAB in the last 72 hours.  Invalid input(s): FREET3  Other results:   Imaging     No results found.   Medications:     Scheduled Medications: .  stroke: mapping our early stages of recovery book   Does not apply Once  . aspirin EC  81 mg Oral Daily  . atorvastatin  80 mg Oral Daily  . carvedilol  3.125 mg Oral BID WC  . ezetimibe  10 mg Oral Daily  . furosemide  20 mg Oral Daily  . insulin aspart  0-15 Units Subcutaneous TID WC  . insulin aspart  0-5 Units Subcutaneous QHS  . insulin aspart  7 Units Subcutaneous TID WC  . insulin detemir  30 Units Subcutaneous BID  . levETIRAcetam  1,000 mg Oral BID  . living well with diabetes book   Does not apply Once  . OXcarbazepine  600 mg Oral BID  . phenytoin  100 mg Oral TID  . spironolactone  12.5 mg Oral Daily  . warfarin  5 mg Oral ONCE-1600  . Warfarin - Pharmacist Dosing Inpatient   Does not apply q1600     Infusions: . heparin 900 Units/hr (09/13/20 2240)     PRN  Medications:  acetaminophen **OR** acetaminophen (TYLENOL) oral liquid 160 mg/5 mL **OR** acetaminophen, dextrose     Assessment/Plan   1. Chronic systolic CHF: Nonischemic cardiomyopathy known for years.  Normal coronary CTA in 2014.  Echo this admission with EF 10-15%, LV thrombus, moderate RV dysfunction, dilated IVC. With markedly high LDL and poorly controlled glucose, concern for development of CAD though no chest pain.  On exam, he does not look volume overloaded though IVC dilated on echo.  SBP 110s. Continue titration of GDMT  - Continue Coreg 3.125 mg bid.  - Continue Spironolactone 12.5 daily.  - Continue Lasix 20 mg daily.  - Start Losartan 12.5 daily  - Hgb A1c too high for SGLT2i  - Consider right/left cath in future given risk factors though coronary CTA was negative in the past.  Would hold off for now given need for anticoagulation and recent CVA.  - Would involve paramedicine at discharge. Referral order sent  2. LV thrombus: With associated CVA.  - Discussed with neurology, ok to start heparin gtt/warfarin.  - INR 1.0 today. Continue heparin gtt until therapeutic  3. 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  Still with some dysarthria.  - Anticoagulation ok per neurology.  - Atorvastatin.  4. 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  Now on Keppra.  5.  Hyperlipidemia: Markedly high LDL.   - Start atorvastatin 80 mg daily.  - Zetia 10 added  - F/u LP 608 weeks. Lipid Clinic referral if LDL not at goal after improved compliance  6.  Diabetes: Poor control. Hgb A1c >15.5  - insulin per primary team.    Length of Stay: 2  Brittainy Simmons, PA-C  09/14/2020, 11:13 AM  Advanced Heart Failure Team Pager [phone] (M-F; 7a - 5p)  Please contact [facility] for night-coverage after hours (5p -7a ) and weekends on [email or web address]  Agree with the above PA note.   Stable today, not volume overloaded.   Add losartan as above.   Continue heparin  bridge to therapeutic INR on warfarin.  Marca Ancona 09/14/2020

## 2020-09-14 NOTE — Progress Notes (Signed)
ANTICOAGULATION CONSULT NOTE - Follow Up Consult  Pharmacy Consult for Warfarin and IV Heparin Indication: LV Thrombus  No Known Allergies  Patient Measurements: Height: 5\' 11"  (180.3 cm) Weight: 64.5 kg (142 lb 4.8 oz) IBW/kg (Calculated) : 75.3 Heparin Dosing Weight: 64.5 kg  Vital Signs: Temp: 98.6 F (37 C) (06/02 1517) Temp Source: Oral (06/02 1517) BP: 115/92 (06/02 1700) Pulse Rate: 101 (06/02 1700)  Labs: Recent Labs    09/11/20 2019 09/11/20 2037 09/11/20 2039 09/11/20 2159 09/12/20 1035 09/13/20 0255 09/13/20 2057 09/14/20 0650 09/14/20 1545  HGB 14.5 15.0  --   --   --   --   --  15.3  --   HCT 42.0 44.0  --   --   --   --   --  44.4  --   PLT 411*  --   --   --   --   --   --  355  --   LABPROT  --   --   --   --   --   --   --  13.1  --   INR  --   --   --   --   --   --   --  1.0  --   HEPARINUNFRC  --   --   --   --   --   --  0.11* 0.13* 0.33  CREATININE 1.18  --   --    < > 0.91 0.87  --  0.85  --   CKTOTAL  --   --  67  --   --   --   --   --   --    < > = values in this interval not displayed.    Estimated Creatinine Clearance: 108.6 mL/min (by C-G formula based on SCr of 0.85 mg/dL).   Medical History: Past Medical History:  Diagnosis Date  . Chronic systolic CHF (congestive heart failure) (HCC) 06/18/2015  . Diabetes mellitus without complication (HCC)   . Hyperlipidemia   . Hypertension   . Nonischemic cardiomyopathy (HCC) Noted as early as 2007   Per chart review (cards consult note 2011), EF of 40% in 2007, down to 20-25% in 2011    Assessment: 35 yr old man presented with acute neurologic deficits and dysarthia - found to have seizures. MRI showed probable subacute hemorrhagic infarcts involving anterior R frontal lobe and L parieto-occipital cortex. Pharmacy was consulted warfarin and heparin (pt was on no anticoagulants PTA).  ECHO showed LV thrombus. Discussed with HF team and neurology, MRI findings likely chronic in nature and  okay to proceed with anticoagulation for LV thrombus.   Of note, patient is receiving phenytoin and oxycarbazepine which can impact INR.   -heparin level at goal after increased to 1050 units/hr   Goal of Therapy:  INR 2-3 Heparin level 0.3-0.5 units/ml Monitor platelets by anticoagulation protocol: Yes   Plan:  Continue heparin infusion at 1050 units/hr  Monitor daily heparin level, INR, CBC  30, PharmD Clinical Pharmacist **Pharmacist phone directory can now be found on [email or web address] ([email or web address]).  Listed under [facility].

## 2020-09-14 NOTE — Progress Notes (Signed)
Physical Therapy Treatment Patient Details Name: Jason Stevenson MRN: [id number] DOB: July 21, 1985 Today's Date: 09/14/2020    History of Present Illness Pt is a 35 y/o male admitted secondary to increased L sided twitching and tingling. Workup for possible seizure. MRI+ subacute hemorrhagic infarcts involving the anterior  right frontal lobe and left parieto-occipital cortex as above, with  additional chronic right parietal infarct. Pt also found to be in Hyperosmolar hyperglycemic state. .09/13/20, chronic systolic heart failure w/ new LV thrombus.PMH includes HTN, DM, CHF, nonischemic cardiomyopathy    PT Comments    Pt eager to move and participate with therapy .  Pt moving at supervision level with therapist assisting with lines and monitoring heart rate.  Cues for safety and rest breaks.  HR 120's rest and 130-135 bpm with activity.  Continue to progress as able.  Ambulation limited due to EEG monitoring.    Follow Up Recommendations  No PT follow up     Equipment Recommendations  None recommended by PT    Recommendations for Other Services       Precautions / Restrictions Precautions Precautions: Fall Precaution Comments: Watch HR, EEG monitoring    Mobility  Bed Mobility Overal bed mobility: Needs Assistance Bed Mobility: Supine to Sit;Sit to Supine     Supine to sit: Supervision Sit to supine: Supervision   General bed mobility comments: for lines    Transfers Overall transfer level: Needs assistance Equipment used: None Transfers: Sit to/from Stand Sit to Stand: Supervision         General transfer comment: Performed x 3 during session; supervision for lines and vital monitoring  Ambulation/Gait Ambulation/Gait assistance: Supervision Gait Distance (Feet): 12 Feet Assistive device: None       General Gait Details: Limited due to continuous EEG, IV, and telemetry.  Marched in place and walked back and forth at [facility] with supervision for lines and Vitals.  See exercises   Stairs             Wheelchair Mobility    Modified Rankin (Stroke Patients Only) Modified Rankin (Stroke Patients Only) Pre-Morbid Rankin Score: No symptoms Modified Rankin: Moderate disability     Balance Overall balance assessment: Needs assistance   Sitting balance-Leahy Scale: Normal       Standing balance-Leahy Scale: Good Standing balance comment: prefers single UE support with higher level balance, but marching and walking without UE support                            Cognition Arousal/Alertness: Awake/alert Behavior During Therapy: WFL for tasks assessed/performed Overall Cognitive Status: Impaired/Different from baseline Area of Impairment: Problem solving;Attention                   Current Attention Level: Selective         Problem Solving: Slow processing        Exercises Other Exercises Other Exercises: Mini squats with UE support 5 x 3 with rest breaks Other Exercises: Calf raises with UE support 10x2 with rest breaks Other Exercises: Marching in place 5 mins then 8 mins with supervision and no UE support    General Comments General comments (skin integrity, edema, etc.): HR 120's rest and 130-135 bpm with activity      Pertinent Vitals/Pain Pain Assessment: No/denies pain    Home Living                      Prior  Function            PT Goals (current goals can now be found in the care plan section) Acute Rehab PT Goals Patient Stated Goal: to be normal again PT Goal Formulation: With patient Time For Goal Achievement: 09/26/20 Potential to Achieve Goals: Good Progress towards PT goals: Progressing toward goals    Frequency    Min 4X/week      PT Plan Current plan remains appropriate    Co-evaluation              AM-PAC PT "6 Clicks" Mobility   Outcome Measure  Help needed turning from your back to your side while in a flat bed without using bedrails?: None Help  needed moving from lying on your back to sitting on the side of a flat bed without using bedrails?: None Help needed moving to and from a bed to a chair (including a wheelchair)?: A Little Help needed standing up from a chair using your arms (e.g., wheelchair or bedside chair)?: A Little Help needed to walk in hospital room?: A Little Help needed climbing 3-5 steps with a railing? : A Little 6 Click Score: 20    End of Session   Activity Tolerance: Patient tolerated treatment well Patient left: in bed;with call bell/phone within reach;with family/visitor present Nurse Communication: Mobility status PT Visit Diagnosis: Other abnormalities of gait and mobility (R26.89)     Time: 5329-9242 PT Time Calculation (min) (ACUTE ONLY): 28 min  Charges:  $Therapeutic Exercise: 23-37 mins                     Anise Salvo, PT Acute Rehab Services Pager [phone] [facility] [phone]     Rayetta Humphrey 09/14/2020, 12:32 PM

## 2020-09-14 NOTE — Progress Notes (Signed)
PROGRESS NOTE  Jason Stevenson TZG:[id number] DOB: March 29, 1986   PCP: Marcine Matar, MD  Patient is from: Home.  DOA: 09/11/2020 LOS: 2  Chief complaints: Tingling in the left arm, left face and blank stare  Brief Narrative / Interim history: [name] with PMH of systolic CHF, IDDM-2, HTN, HLD, COVID-19 infection in 07/2020 and noncompliance with medication presenting with tingling in left arm, left face, drooling and blank stare for about 1 week, and admitted for possible seizure, HHS and possible CVA.  In ED, he had left-sided neurologic deficit with dysarthria.  Blood glucose elevated to 797 but not acidotic.  Serum osmolality 323.  Lactic acid 2.2.  CT head with probable subacute infarct in right frontal lobe and old right parietal infarct.  Reportedly, patient's neurologic deficits resolved in ED, and reconsult shifted to possible seizure versus stroke.  He was loaded with Keppra and started on insulin drip.   MRI brain with probable hemorrhagic infarct involving the anterior right frontal lobe and left parietal occipital cortex with additional chronic right parietal infarct, right frontotemporal region enhancement suggesting changes related to acute seizure, possible toxic insult involving the caudate and lentiform nuclei bilaterally.  Cardiology recommended interval follow-up brain MRI.  CTA head and neck without significant finding.  Patient had intermittent seizure activities confirmed on continuous EEG.  Keppra increased to 1000 mg twice daily.  Dilantin added.   TTE with EF of 10-15%, LV thrombus, moderate RV dysfunction and dilated IVC.   Advanced heart failure team consulted.   Subjective: Seen and examined earlier this morning.  No major events overnight of this morning.  Reports having a restful night.  Feels better today.  Has not had seizure activities but continues EEG showedsubclinical seizures arising from right temporo-parietal region,average1-2/hour, average  duration about 30 seconds. Tingling sensation resolved.  Denies chest pain or shortness of breath.  Objective: Vitals:   09/14/20 0011 09/14/20 0457 09/14/20 0809 09/14/20 1122  BP: 108/81 (!) 113/98 (!) 108/91 114/83  Pulse: (!) 108 (!) 109 (!) 105 (!) 109  Resp: 15 16 19 19   Temp: 98.6 F (37 C) 98.3 F (36.8 C) 98.5 F (36.9 C) 98.4 F (36.9 C)  TempSrc: Oral Oral Oral Oral  SpO2: 99%  96% 97%  Weight:      Height:        Intake/Output Summary (Last 24 hours) at 09/14/2020 1159 Last data filed at 09/14/2020 0900 Gross per 24 hour  Intake 593.57 ml  Output 500 ml  Net 93.57 ml   Filed Weights   09/13/20 1100  Weight: 64.5 kg    Examination:  GENERAL: No apparent distress.  Nontoxic. HEENT: MMM.  Vision and hearing grossly intact.  NECK: Supple.  No apparent JVD.  RESP: On RA.  No IWOB.  Fair aeration bilaterally. CVS:  RRR. Heart sounds normal.  ABD/GI/GU: BS+. Abd soft, NTND.  MSK/EXT:  Moves extremities. No apparent deformity. No edema.  SKIN: no apparent skin lesion or wound NEURO: Awake and alert. Oriented appropriately.  No apparent focal neuro deficit. PSYCH: Calm. Normal affect.  Procedures:  None  Microbiology summarized: COVID-19 PCR negative.  Assessment & Plan: Seizures: Intermittent seizure activity confirmed on continuous EEG.  Likely due to CVA.  Latest EEG showed subclinical seizures without clinical signs arising from right temporo-parietal region, average 1-2/hr with average duration about 30 seconds. -Neurology managing-on Keppra 1000 mg twice daily and Dilantin 100 mg 3 times daily -Seizure and fall precautions -Continue PT/OT/SLP  Subacute hemorrhagic CVA-presented  with left arm and face tingling.  Noted to have some dysarthria.  Has some left facial droop and residual motor and sensory deficit on the left. CT and MRI with probable subacute hemorrhagic infarct in right frontal and left parietal occipital areas. CTA head and neck basically  negative.  He is noncompliant.  A lot of risk factors.  LDL is 305.  TTE with severely reduced LVEF and LV thrombus -Neurology managing-on IV heparin, low-dose aspirin and high intensity statin -Appreciate PT/OT/SLP eval   Uncontrolled IDDM-2 with HHS and hyperlipidemia: A1c> 15.5% Recent Labs  Lab 09/13/20 1152 09/13/20 1617 09/13/20 2118 09/14/20 0613 09/14/20 1125  GLUCAP 234* 188* 86 128* 131*  -Continue Lantus 30 units twice daily starting tonight -Continue NovoLog 7 units 3 times daily with meals -Continue SSI/moderate -High intensity statin as above   Chronic systolic CHF/LV thrombus: TTE with EF of 10-15%, LV thrombus, moderate RV dysfunction and dilated IVC. He has not had subsequent TTE or follow-up.  Supposed to be on Coreg and Entresto but not compliant. Appears euvolemic on exam.  I&O incomplete.  Only 500 cc from overnight. -Advanced heart failure team managing-on Lasix  -GDMT-on low-dose Coreg, Aldactone -Consider SGLT2 inhibitors if no contraindication -Continue statin and Zetia -Closely monitor fluid and respiratory status  LV thrombus -On warfarin with heparin bridge as above -Monitor PTT/INR  Essential hypertension/sinus tachycardia: Normotensive for most part.  HR 100-120s -Consider increasing Coreg-defer to cardiology  Noncompliance -Counseled on this multiple times  History of COVID-19 infection -COVID-19 PCR negative now  Body mass index is 19.85 kg/m.         DVT prophylaxis:  Place and maintain sequential compression device Start: 09/12/20 0141 warfarin (COUMADIN) tablet 5 mg  Code Status: Full code Family Communication: Updated patient's wife at bedside  Level of care: Progressive  Status is: Inpatient  Remains inpatient appropriate because:Hemodynamically unstable, IV treatments appropriate due to intensity of illness or inability to take PO and Inpatient level of care appropriate due to severity of illness   Dispo: The patient is  from: Home              Anticipated d/c is to: Home              Patient currently is not medically stable to d/c.   Difficult to place patient No            Consultants:  Neurology Advanced heart failure team   Sch Meds:  Scheduled Meds: .  stroke: mapping our early stages of recovery book   Does not apply Once  . aspirin EC  81 mg Oral Daily  . atorvastatin  80 mg Oral Daily  . carvedilol  3.125 mg Oral BID WC  . ezetimibe  10 mg Oral Daily  . furosemide  20 mg Oral Daily  . insulin aspart  0-15 Units Subcutaneous TID WC  . insulin aspart  0-5 Units Subcutaneous QHS  . insulin aspart  7 Units Subcutaneous TID WC  . insulin detemir  30 Units Subcutaneous BID  . levETIRAcetam  1,000 mg Oral BID  . living well with diabetes book   Does not apply Once  . OXcarbazepine  600 mg Oral BID  . phenytoin  100 mg Oral TID  . spironolactone  12.5 mg Oral Daily  . warfarin  5 mg Oral ONCE-1600  . Warfarin - Pharmacist Dosing Inpatient   Does not apply q1600   Continuous Infusions: . heparin 1,050 Units/hr (09/14/20 1000)  PRN Meds:.acetaminophen **OR** acetaminophen (TYLENOL) oral liquid 160 mg/5 mL **OR** acetaminophen, dextrose  Antimicrobials: Anti-infectives (From admission, onward)   None       I have personally reviewed the following labs and images: CBC: Recent Labs  Lab 09/11/20 2019 09/11/20 2037 09/14/20 0650  WBC 5.2  --  5.7  HGB 14.5 15.0 15.3  HCT 42.0 44.0 44.4  MCV 83.0  --  83.6  PLT 411*  --  355   BMP &GFR Recent Labs  Lab 09/11/20 2039 09/11/20 2159 09/12/20 0201 09/12/20 1035 09/13/20 0255 09/14/20 0650  NA  --  128* 135 139 132* 136  K  --  4.9 3.9 3.7 4.0 3.9  CL  --  90* 98 101 94* 98  CO2  --  27 26 28 25 28   GLUCOSE  --  707* 340* 119* 186* 137*  BUN  --  15 13 12 15 14   CREATININE  --  1.14 0.90 0.91 0.87 0.85  CALCIUM  --  9.4 9.6 9.7 9.3 9.4  MG 2.3  --   --   --  1.8  --   PHOS  --   --   --   --  4.3  --     Estimated Creatinine Clearance: 108.6 mL/min (by C-G formula based on SCr of 0.85 mg/dL). Liver & Pancreas: Recent Labs  Lab 09/11/20 2019 09/13/20 0255  AST 19  --   ALT 17  --   ALKPHOS 70  --   BILITOT 0.7  --   PROT 8.3*  --   ALBUMIN 3.8 3.1*   No results for input(s): LIPASE, AMYLASE in the last 168 hours. No results for input(s): AMMONIA in the last 168 hours. Diabetic: Recent Labs    09/12/20 0202  HGBA1C >15.5*   Recent Labs  Lab 09/13/20 1152 09/13/20 1617 09/13/20 2118 09/14/20 0613 09/14/20 1125  GLUCAP 234* 188* 86 128* 131*   Cardiac Enzymes: Recent Labs  Lab 09/11/20 2039  CKTOTAL 67   No results for input(s): PROBNP in the last 8760 hours. Coagulation Profile: Recent Labs  Lab 09/14/20 0650  INR 1.0   Thyroid Function Tests: No results for input(s): TSH, T4TOTAL, FREET4, T3FREE, THYROIDAB in the last 72 hours. Lipid Profile: Recent Labs    09/12/20 0201  CHOL 380*  HDL 45  LDLCALC 305*  TRIG 149  CHOLHDL 8.4   Anemia Panel: No results for input(s): VITAMINB12, FOLATE, FERRITIN, TIBC, IRON, RETICCTPCT in the last 72 hours. Urine analysis:    Component Value Date/Time   COLORURINE STRAW (A) 09/11/2020 2014   APPEARANCEUR CLEAR 09/11/2020 2014   LABSPEC 1.031 (H) 09/11/2020 2014   PHURINE 5.0 09/11/2020 2014   GLUCOSEU >=500 (A) 09/11/2020 2014   HGBUR NEGATIVE 09/11/2020 2014   BILIRUBINUR NEGATIVE 09/11/2020 2014   BILIRUBINUR neg 04/17/2016 1134   KETONESUR 5 (A) 09/11/2020 2014   PROTEINUR NEGATIVE 09/11/2020 2014   UROBILINOGEN 1.0 04/17/2016 1134   UROBILINOGEN 1.0 11/05/2012 1608   NITRITE NEGATIVE 09/11/2020 2014   LEUKOCYTESUR NEGATIVE 09/11/2020 2014   Sepsis Labs: Invalid input(s): PROCALCITONIN, LACTICIDVEN  Microbiology: Recent Results (from the past 240 hour(s))  SARS CORONAVIRUS 2 (TAT 6-24 HRS) Nasopharyngeal Nasopharyngeal Swab     Status: None   Collection Time: 09/12/20 12:57 AM   Specimen:  Nasopharyngeal Swab  Result Value Ref Range Status   SARS Coronavirus 2 NEGATIVE NEGATIVE Final    Comment: (NOTE) SARS-CoV-2 target nucleic acids are NOT DETECTED.  The  SARS-CoV-2 RNA is generally detectable in upper and lower respiratory specimens during the acute phase of infection. Negative results do not preclude SARS-CoV-2 infection, do not rule out co-infections with other pathogens, and should not be used as the sole basis for treatment or other patient management decisions. Negative results must be combined with clinical observations, patient history, and epidemiological information. The expected result is Negative.  Fact Sheet for Patients: HairSlick.no  Fact Sheet for Healthcare Providers: quierodirigir.com  This test is not yet approved or cleared by the [place] FDA and  has been authorized for detection and/or diagnosis of SARS-CoV-2 by FDA under an Emergency Use Authorization (EUA). This EUA will remain  in effect (meaning this test can be used) for the duration of the COVID-19 declaration under Se ction 564(b)(1) of the Act, 21 U.S.C. section 360bbb-3(b)(1), unless the authorization is terminated or revoked sooner.  Performed at [facility], [address], [place], [place] [address]     Radiology Studies: No results found.    Lovett Coffin T. Karista Aispuro [facility]  If 7PM-7AM, please contact night-coverage [email or web address] 09/14/2020, 11:59 AM

## 2020-09-14 NOTE — Progress Notes (Addendum)
ANTICOAGULATION CONSULT NOTE - Follow Up Consult  Pharmacy Consult for Warfarin and IV Heparin Indication: LV Thrombus  No Known Allergies  Patient Measurements: Height: 5\' 11"  (180.3 cm) Weight: 64.5 kg (142 lb 4.8 oz) IBW/kg (Calculated) : 75.3 Heparin Dosing Weight: 64.5 kg  Vital Signs: Temp: 98.5 F (36.9 C) (06/02 0809) Temp Source: Oral (06/02 0809) BP: 108/91 (06/02 0809) Pulse Rate: 105 (06/02 0809)  Labs: Recent Labs    09/11/20 2019 09/11/20 2037 09/11/20 2039 09/11/20 2159 09/12/20 1035 09/13/20 0255 09/13/20 2057 09/14/20 0650  HGB 14.5 15.0  --   --   --   --   --  15.3  HCT 42.0 44.0  --   --   --   --   --  44.4  PLT 411*  --   --   --   --   --   --  355  LABPROT  --   --   --   --   --   --   --  13.1  INR  --   --   --   --   --   --   --  1.0  HEPARINUNFRC  --   --   --   --   --   --  0.11* 0.13*  CREATININE 1.18  --   --    < > 0.91 0.87  --  0.85  CKTOTAL  --   --  67  --   --   --   --   --    < > = values in this interval not displayed.    Estimated Creatinine Clearance: 108.6 mL/min (by C-G formula based on SCr of 0.85 mg/dL).   Medical History: Past Medical History:  Diagnosis Date  . Chronic systolic CHF (congestive heart failure) (HCC) 06/18/2015  . Diabetes mellitus without complication (HCC)   . Hyperlipidemia   . Hypertension   . Nonischemic cardiomyopathy (HCC) Noted as early as 2007   Per chart review (cards consult note 2011), EF of 40% in 2007, down to 20-25% in 2011    Assessment: 35 yr old man presented with acute neurologic deficits and dysarthia - found to have seizures. MRI showed probable subacute hemorrhagic infarcts involving anterior R frontal lobe and L parieto-occipital cortex. Pharmacy was consulted warfarin and heparin (pt was on no anticoagulants PTA).  ECHO showed LV thrombus. Discussed with HF team and neurology, MRI findings likely chronic in nature and okay to proceed with anticoagulation for LV thrombus.    Of note, patient is receiving phenytoin and oxycarbazepine which can impact INR.    Heparin level came back subtherapeutic at 0.13, on 900 units/hr. Hgb 15.3, plt 355. No s/sx of bleeding or infusion issues. INR today after warfarin restart last night came back at 1 - anticipate it taking a couple days for INR to move.   Goal of Therapy:  INR 2-3 Heparin level 0.3-0.5 units/ml Monitor platelets by anticoagulation protocol: Yes   Plan:  Increase heparin infusion to 1050 units/hr  Check heparin level in 6 hrs Will order warfarin 7.5 mg tonight  Monitor daily heparin level, INR, CBC Monitor for bleeding  30, PharmD, BCCCP Clinical Pharmacist  Phone: [phone] 09/14/2020 9:10 AM  Please check AMION for all [facility] phone numbers After 10:00 PM, call Main Pharmacy [phone]

## 2020-09-14 NOTE — Progress Notes (Signed)
LTM maint complete - Maintenance P3 Pz T8 Fz F4 Cz  [facility] monitored, Event button test confirmed by [facility].

## 2020-09-15 LAB — BASIC METABOLIC PANEL
Anion gap: 8 (ref 5–15)
Anion gap: 13 (ref 5–15)
BUN: 14 mg/dL (ref 6–20)
BUN: 14 mg/dL (ref 6–20)
CO2: 27 mmol/L (ref 22–32)
CO2: 24 mmol/L (ref 22–32)
Calcium: 8.8 mg/dL — ABNORMAL LOW (ref 8.9–10.3)
Calcium: 8.8 mg/dL — ABNORMAL LOW (ref 8.9–10.3)
Chloride: 100 mmol/L (ref 98–111)
Chloride: 97 mmol/L — ABNORMAL LOW (ref 98–111)
Creatinine, Ser: 0.78 mg/dL (ref 0.61–1.24)
Creatinine, Ser: 0.72 mg/dL (ref 0.61–1.24)
GFR, Estimated: >60 mL/min (ref >60)
GFR, Estimated: >60 mL/min (ref >60)
Glucose, Bld: 115 mg/dL — ABNORMAL HIGH (ref 70–99)
Glucose, Bld: 106 mg/dL — ABNORMAL HIGH (ref 70–99)
Potassium: 3.7 mmol/L (ref 3.5–5.1)
Potassium: 3.8 mmol/L (ref 3.5–5.1)
Sodium: 135 mmol/L (ref 135–145)
Sodium: 134 mmol/L — ABNORMAL LOW (ref 135–145)

## 2020-09-15 LAB — RAPID URINE DRUG SCREEN, HOSP PERFORMED
Amphetamines: NOT DETECTED
Barbiturates: NOT DETECTED
Benzodiazepines: NOT DETECTED
Cocaine: NOT DETECTED
Opiates: NOT DETECTED
Tetrahydrocannabinol: NOT DETECTED

## 2020-09-15 LAB — CBC
HCT: 39.8 % (ref 39.0–52.0)
Hemoglobin: 13.2 g/dL (ref 13.0–17.0)
MCH: 28.2 pg (ref 26.0–34.0)
MCHC: 33.2 g/dL (ref 30.0–36.0)
MCV: 85 fL (ref 80.0–100.0)
Platelets: 412 10*3/uL — ABNORMAL HIGH (ref 150–400)
RBC: 4.68 MIL/uL (ref 4.22–5.81)
RDW: 11.7 % (ref 11.5–15.5)
WBC: 5.8 10*3/uL (ref 4.0–10.5)
nRBC: 0 % (ref 0.0–0.2)

## 2020-09-15 LAB — GLUCOSE, CAPILLARY
Glucose-Capillary: 65 mg/dL — ABNORMAL LOW (ref 70–99)
Glucose-Capillary: 103 mg/dL — ABNORMAL HIGH (ref 70–99)
Glucose-Capillary: 205 mg/dL — ABNORMAL HIGH (ref 70–99)
Glucose-Capillary: 107 mg/dL — ABNORMAL HIGH (ref 70–99)
Glucose-Capillary: 74 mg/dL (ref 70–99)
Glucose-Capillary: 180 mg/dL — ABNORMAL HIGH (ref 70–99)
Glucose-Capillary: 174 mg/dL — ABNORMAL HIGH (ref 70–99)

## 2020-09-15 LAB — PHENYTOIN LEVEL, TOTAL: Phenytoin Lvl: 18.4 ug/mL (ref 10.0–20.0)

## 2020-09-15 LAB — MAGNESIUM
Magnesium: 2 mg/dL (ref 1.7–2.4)
Magnesium: 1.9 mg/dL (ref 1.7–2.4)

## 2020-09-15 LAB — PROTIME-INR
INR: 1.1 (ref 0.8–1.2)
Prothrombin Time: 14.6 seconds (ref 11.4–15.2)

## 2020-09-15 LAB — TSH: TSH: 1.009 u[IU]/mL (ref 0.350–4.500)

## 2020-09-15 LAB — HEPARIN LEVEL (UNFRACTIONATED): Heparin Unfractionated: 0.38 IU/mL (ref 0.30–0.70)

## 2020-09-15 MED ORDER — POTASSIUM CHLORIDE CRYS ER 20 MEQ PO TBCR
20.0000 meq | EXTENDED_RELEASE_TABLET | Freq: Once | ORAL | Status: AC
  Administered 2020-09-15: 20 meq via ORAL
  Filled 2020-09-15: qty 1

## 2020-09-15 MED ORDER — INSULIN DETEMIR 100 UNIT/ML ~~LOC~~ SOLN
25.0000 [IU] | Freq: Two times a day (BID) | SUBCUTANEOUS | Status: DC
  Administered 2020-09-15 – 2020-09-17 (×6): 25 [IU] via SUBCUTANEOUS
  Filled 2020-09-15 (×9): qty 0.25

## 2020-09-15 MED ORDER — CARVEDILOL 12.5 MG PO TABS
12.5000 mg | ORAL_TABLET | Freq: Two times a day (BID) | ORAL | Status: DC
  Administered 2020-09-15 – 2020-09-16 (×3): 12.5 mg via ORAL
  Filled 2020-09-15 (×3): qty 1

## 2020-09-15 MED ORDER — WARFARIN SODIUM 7.5 MG PO TABS
7.5000 mg | ORAL_TABLET | Freq: Once | ORAL | Status: AC
  Administered 2020-09-15: 7.5 mg via ORAL
  Filled 2020-09-15: qty 1

## 2020-09-15 MED ORDER — INSULIN ASPART 100 UNIT/ML IJ SOLN
6.0000 [IU] | Freq: Three times a day (TID) | INTRAMUSCULAR | Status: DC
  Administered 2020-09-15 – 2020-09-19 (×14): 6 [IU] via SUBCUTANEOUS

## 2020-09-15 MED ORDER — MAGNESIUM SULFATE IN D5W 1-5 GM/100ML-% IV SOLN
1.0000 g | Freq: Once | INTRAVENOUS | Status: AC
  Administered 2020-09-15: 1 g via INTRAVENOUS
  Filled 2020-09-15: qty 100

## 2020-09-15 MED ORDER — IVABRADINE HCL 5 MG PO TABS
5.0000 mg | ORAL_TABLET | Freq: Two times a day (BID) | ORAL | Status: DC
  Administered 2020-09-15 – 2020-09-17 (×5): 5 mg via ORAL
  Filled 2020-09-15 (×7): qty 1

## 2020-09-15 MED ORDER — COUMADIN BOOK
Freq: Once | Status: AC
  Filled 2020-09-15: qty 1

## 2020-09-15 NOTE — Progress Notes (Signed)
Occupational Therapy Treatment Patient Details Name: Jason Stevenson MRN: [id number] DOB: 04-10-1986 Today's Date: 09/15/2020    History of present illness Pt is a 35 y/o male admitted secondary to increased L sided twitching and tingling. Workup for possible seizure. MRI+ subacute hemorrhagic infarcts involving the anterior  right frontal lobe and left parieto-occipital cortex as above, with  additional chronic right parietal infarct. Pt also found to be in Hyperosmolar hyperglycemic state. .09/13/20, chronic systolic heart failure w/ new LV thrombus.PMH includes HTN, DM, CHF, nonischemic cardiomyopathy   OT comments  Pt progressing towards established OT goals. Administering the Pill Box test to further assess planning, mental flexibility, suboptimal search strategies, concrete thinking, and the inability to multitask. Pt had a total of _0_errors and requiring 10 minutes to complete; more than 3 errors and/or over 5 minutes is considered a fail.          Number of misplaced movement errors (pills placed in incorrect compartment)- _0_       Number of total errors (sum of omissions; misplacements) - _0_       Total time to complete task (allowed 5 min) - _10_ min  Pt also completing oral care standing at sink and functional mobility with Min Guard-Min A for balance and safety. Providing education on compensatory techniques during oral care to prevent forward bending. Pt continue to be very motivated to participate in therapy and very receptive to education. Continue to recommend dc to home with follow up at OP neuro. Will continue to follow acutely as admitted.     Follow Up Recommendations  Outpatient OT (neuro OP)    Equipment Recommendations  None recommended by OT    Recommendations for Other Services Speech consult (Cog eval)    Precautions / Restrictions Precautions Precautions: Fall Precaution Comments: Watch HR, continuous EEG monitoring       Mobility Bed Mobility Overal bed  mobility: Modified Independent             General bed mobility comments: Increased time. Line management    Transfers Overall transfer level: Needs assistance Equipment used: None Transfers: Sit to/from Stand Sit to Stand: Min guard         General transfer comment: Min guard A for safety    Balance Overall balance assessment: Needs assistance Sitting-balance support: No upper extremity supported;Feet supported Sitting balance-Leahy Scale: Normal     Standing balance support: No upper extremity supported Standing balance-Leahy Scale: Good                             ADL either performed or assessed with clinical judgement   ADL Overall ADL's : Needs assistance/impaired     Grooming: Oral care;Min guard;Standing Grooming Details (indicate cue type and reason): oral care at sink with supervision for safety. Significant time. Education on use of cups for rinsing to present forward bending                 Toilet Transfer: Min guard;Ambulation (simulated in room)           Functional mobility during ADLs: Minimal assistance General ADL Comments: Completing IADL task - medication managemen - and then oral care at sink     Vision   Additional Comments: Pt reading from menu and pill bottle labels without difficulty. Denies diplopia or blurry vision   Perception     Praxis      Cognition Arousal/Alertness: Awake/alert Behavior During Therapy: WFL for  tasks assessed/performed Overall Cognitive Status: Impaired/Different from baseline Area of Impairment: Attention;Problem solving;Following commands                   Current Attention Level: Alternating;Divided   Following Commands: Follows multi-step commands with increased time     Problem Solving: Slow processing General Comments: Administering the Pill Box Test. Pt without errors but requiring 10 minutes to complete (standard time being 5 minutes).        Exercises      Shoulder Instructions       General Comments HR elevating to135 with upright posture during IADL task. Wife present throughout    Pertinent Vitals/ Pain       Pain Assessment: No/denies pain  Home Living                                          Prior Functioning/Environment              Frequency  Min 2X/week        Progress Toward Goals  OT Goals(current goals can now be found in the care plan section)  Progress towards OT goals: Progressing toward goals  Acute Rehab OT Goals Patient Stated Goal: to be normal again OT Goal Formulation: With patient Time For Goal Achievement: 09/26/20 Potential to Achieve Goals: Good ADL Goals Pt Will Perform Lower Body Dressing: with modified independence;sit to/from stand Pt Will Transfer to Toilet: with modified independence;ambulating Additional ADL Goal #1: Pt will complete 3 step trail making task in moderately distracting environment with minmal redirectional cues Additional ADL Goal #2: Pt will complete 3 step medication management task in moderately distracting enviornment without redirectional cues  Plan Discharge plan remains appropriate    Co-evaluation                 AM-PAC OT "6 Clicks" Daily Activity     Outcome Measure   Help from another person eating meals?: None Help from another person taking care of personal grooming?: A Little Help from another person toileting, which includes using toliet, bedpan, or urinal?: A Little Help from another person bathing (including washing, rinsing, drying)?: A Little Help from another person to put on and taking off regular upper body clothing?: None Help from another person to put on and taking off regular lower body clothing?: A Little 6 Click Score: 20    End of Session Equipment Utilized During Treatment: Gait belt  OT Visit Diagnosis: Unsteadiness on feet (R26.81);Other symptoms and signs involving the nervous system (R29.898);Other  symptoms and signs involving cognitive function   Activity Tolerance Patient tolerated treatment well   Patient Left in bed;with call bell/phone within reach;with bed alarm set;with family/visitor present   Nurse Communication Mobility status        Time: 1421-1506 OT Time Calculation (min): 45 min  Charges: OT General Charges $OT Visit: 1 Visit OT Treatments $Self Care/Home Management : 38-52 mins  Cyerra Yim MSOT, OTR/L Acute Rehab Pager: [phone] Office: [phone]   Theodoro Grist Latayvia Mandujano 09/15/2020, 4:10 PM

## 2020-09-15 NOTE — Progress Notes (Signed)
Hypoglycemic Event  CBG: 65  Treatment: Glucerna and Crackers  Symptoms: None  Follow-up CBG: [id number] CBG Result:103  Possible Reasons for Event: Possible high dose of Levemir at HS  Comments/MD notified:Dr Shalhoub    Jason Stevenson, Jason Stevenson

## 2020-09-15 NOTE — Progress Notes (Signed)
ANTICOAGULATION CONSULT NOTE - Follow Up Consult  Pharmacy Consult for Warfarin and IV Heparin Indication: LV Thrombus  No Known Allergies  Patient Measurements: Height: 5\' 11"  (180.3 cm) Weight: 64.5 kg (142 lb 4.8 oz) IBW/kg (Calculated) : 75.3 Heparin Dosing Weight: 64.5 kg  Vital Signs: Temp: 99.2 F (37.3 C) (06/03 0730) Temp Source: Oral (06/03 0730) BP: 121/91 (06/03 0730) Pulse Rate: 135 (06/03 0730)  Labs: Recent Labs    09/14/20 0650 09/14/20 1545 09/14/20 2322 09/15/20 0008  HGB 15.3  --   --  13.2  HCT 44.4  --   --  39.8  PLT 355  --   --  412*  LABPROT 13.1  --   --  14.6  INR 1.0  --   --  1.1  HEPARINUNFRC 0.13* 0.33  --  0.38  CREATININE 0.85  --  0.78 0.72    Estimated Creatinine Clearance: 115.3 mL/min (by C-G formula based on SCr of 0.72 mg/dL).   Medical History: Past Medical History:  Diagnosis Date  . Chronic systolic CHF (congestive heart failure) (HCC) 06/18/2015  . Diabetes mellitus without complication (HCC)   . Hyperlipidemia   . Hypertension   . Nonischemic cardiomyopathy (HCC) Noted as early as 2007   Per chart review (cards consult note 2011), EF of 40% in 2007, down to 20-25% in 2011    Assessment: 35 yr old man presented with acute neurologic deficits and dysarthia - found to have seizures. MRI showed probable subacute hemorrhagic infarcts involving anterior R frontal lobe and L parieto-occipital cortex. Pharmacy was consulted warfarin and heparin (pt was on no anticoagulants PTA).  ECHO showed LV thrombus. Discussed with HF team and neurology, MRI findings likely chronic in nature and okay to proceed with anticoagulation for LV thrombus.   Of note, patient is receiving phenytoin and oxycarbazepine which can impact INR.    Heparin level came back therapeutic at 0.38, on 1050 units/hr. Hgb 13.2, plt 412. No s/sx of bleeding or infusion issues. INR up slightly to 1.1 - has received 2 doses of warfarin.   Goal of Therapy:  INR  2-3 Heparin level 0.3-0.5 units/ml Monitor platelets by anticoagulation protocol: Yes   Plan:  Continue heparin infusion at 1050 units/hr  Order warfarin 7.5 mg tonight  Monitor daily heparin level, INR, CBC  30, PharmD, BCCCP Clinical Pharmacist  Phone: [phone] 09/15/2020 10:26 AM  Please check AMION for all [facility] phone numbers After 10:00 PM, call Main Pharmacy [phone]

## 2020-09-15 NOTE — Progress Notes (Signed)
PROGRESS NOTE  Jason Stevenson JAS:[id number] DOB: 08-19-1985   PCP: Marcine Matar, MD  Patient is from: Home.  DOA: 09/11/2020 LOS: 3  Chief complaints: Tingling in the left arm, left face and blank stare  Brief Narrative / Interim history: [name] with PMH of systolic CHF, IDDM-2, HTN, HLD, COVID-19 infection in 07/2020 and noncompliance with medication presenting with tingling in left arm, left face, drooling and blank stare for about 1 week, and admitted for possible seizure, HHS and possible CVA.  In ED, he had left-sided neurologic deficit with dysarthria.  Blood glucose elevated to 797 but not acidotic.  Serum osmolality 323.  Lactic acid 2.2.  CT head with probable subacute infarct in right frontal lobe and old right parietal infarct.  Reportedly, patient's neurologic deficits resolved in ED, and reconsult shifted to possible seizure versus stroke.  He was loaded with Keppra and started on insulin drip.   MRI brain with probable hemorrhagic infarct involving the anterior right frontal lobe and left parietal occipital cortex with additional chronic right parietal infarct, right frontotemporal region enhancement suggesting changes related to acute seizure, possible toxic insult involving the caudate and lentiform nuclei bilaterally.  Cardiology recommended interval follow-up brain MRI.  CTA head and neck without significant finding.  Patient had intermittent seizure activities confirmed on continuous EEG.  Keppra increased to 1000 mg twice daily.  Dilantin added.   TTE with EF of 10-15%, LV thrombus, moderate RV dysfunction and dilated IVC.   Advanced heart failure team consulted and managing.   Subjective: Seen and examined earlier this morning.  Patient had tachycardia and elevated blood pressure overnight that persisted despite push of IV metoprolol.  His Coreg was increased but he did not receive last night.  Otherwise, feels well.  Has no complaint this morning.  He denies  chest pain, shortness of breath, GI or UTI symptoms.  Neuro symptoms resolved.  He is sitting in bed getting haircut.  Objective: Vitals:   09/15/20 0730 09/15/20 0800 09/15/20 0830 09/15/20 1212  BP: (!) 121/91  (!) 112/95 109/84  Pulse: (!) 135 (!) 129 (!) 136 (!) 122  Resp: 16 18 18 16   Temp: 99.2 F (37.3 C)   98 F (36.7 C)  TempSrc: Oral   Oral  SpO2: 99% 96% 97% 100%  Weight:      Height:        Intake/Output Summary (Last 24 hours) at 09/15/2020 1443 Last data filed at 09/15/2020 0921 Gross per 24 hour  Intake 836.43 ml  Output 450 ml  Net 386.43 ml   Filed Weights   09/13/20 1100  Weight: 64.5 kg    Examination:   GENERAL: No apparent distress.  Nontoxic. HEENT: MMM.  Vision and hearing grossly intact.  NECK: Supple.  No apparent JVD.  RESP: On RA no IWOB.  Fair aeration bilaterally. CVS: HR in 120s. Heart sounds normal.  ABD/GI/GU: BS+. Abd soft, NTND.  MSK/EXT:  Moves extremities. No apparent deformity. No edema.  SKIN: no apparent skin lesion or wound NEURO: Awake and alert. Oriented appropriately.  No apparent focal neuro deficit. PSYCH: Calm. Normal affect.  Procedures:  None  Microbiology summarized: COVID-19 PCR negative.  Assessment & Plan: Chronic systolic CHF/LV thrombus: TTE with EF of 10-15%, LV thrombus, moderate RV dysfunction and dilated IVC. He has not had subsequent TTE or follow-up.  Supposed to be on Coreg and Entresto but not compliant.  I&O incomplete.  Only 450 cc from overnight charted. Appears euvolemic on exam. -  Advanced heart failure team managing-on Lasix  -GDMT-increase Coreg to 12.5 mg BID.  Continue Lasix, losartan and Aldactone.  Corlanor added. -A1c too high for SGLT2 inhibitors. -Continue statin and Zetia -Monitor fluid status, renal functions and electrolytes  LV thrombus -On warfarin with heparin bridge as above -Monitor PTT/INR  Sinus tachycardia/uncontrolled BP: HR 120s.  DBP in in 100s. -Cardiac meds as  above  Subacute hemorrhagic CVA-presented with left arm and face tingling.  Noted to have some dysarthria.  Has some left facial droop and residual motor and sensory deficit on the left. CT and MRI with probable subacute hemorrhagic infarct in right frontal and left parietal occipital areas. CTA head and neck basically negative.  He is noncompliant.  A lot of risk factors.  LDL is 305.  TTE with severely reduced LVEF and LV thrombus -Neurology managing-on IV heparin, low-dose aspirin and high intensity statin -Appreciate PT/OT/SLP eval  Seizures: Intermittent seizure activity confirmed on continuous EEG.  Likely due to CVA.  -Neurology managing-on Keppra 1000 mg twice daily and Dilantin 100 mg 3 times daily -Seizure and fall precautions -Continue PT/OT/SLP-no need identified.  Uncontrolled IDDM-2 with HHS and hyperlipidemia: A1c> 15.5% Recent Labs  Lab 09/14/20 1606 09/14/20 2103 09/15/20 0617 09/15/20 0634 09/15/20 1213  GLUCAP 246* 75 65* 103* 205*  -Decrease Lantus from 30 to 25 units twice daily -Decrease NovoLog from 7 to 6 units 3 times daily with meals -Continue SSI/moderate -High intensity statin as above   Noncompliance -Counseled on this multiple times  History of COVID-19 infection -COVID-19 PCR negative now  Body mass index is 19.85 kg/m.         DVT prophylaxis:  Place and maintain sequential compression device Start: 09/12/20 0141 warfarin (COUMADIN) tablet 7.5 mg  Code Status: Full code Family Communication: Updated patient's wife at bedside  Level of care: Progressive  Status is: Inpatient  Remains inpatient appropriate because:Hemodynamically unstable, IV treatments appropriate due to intensity of illness or inability to take PO and Inpatient level of care appropriate due to severity of illness   Dispo: The patient is from: Home              Anticipated d/c is to: Home              Patient currently is not medically stable to d/c.   Difficult to  place patient No            Consultants:  Neurology Advanced heart failure team   Sch Meds:  Scheduled Meds: .  stroke: mapping our early stages of recovery book   Does not apply Once  . aspirin EC  81 mg Oral Daily  . atorvastatin  80 mg Oral Daily  . carvedilol  12.5 mg Oral BID WC  . ezetimibe  10 mg Oral Daily  . furosemide  20 mg Oral Daily  . insulin aspart  0-15 Units Subcutaneous TID WC  . insulin aspart  0-5 Units Subcutaneous QHS  . insulin aspart  6 Units Subcutaneous TID WC  . insulin detemir  25 Units Subcutaneous BID  . ivabradine  5 mg Oral BID WC  . levETIRAcetam  1,000 mg Oral BID  . living well with diabetes book   Does not apply Once  . losartan  12.5 mg Oral Daily  . OXcarbazepine  600 mg Oral BID  . phenytoin  100 mg Oral TID  . spironolactone  12.5 mg Oral Daily  . warfarin  7.5 mg Oral ONCE-1600  .  Warfarin - Pharmacist Dosing Inpatient   Does not apply q1600   Continuous Infusions: . heparin 1,050 Units/hr (09/15/20 0343)   PRN Meds:.acetaminophen **OR** acetaminophen (TYLENOL) oral liquid 160 mg/5 mL **OR** acetaminophen, dextrose, ondansetron (ZOFRAN) IV  Antimicrobials: Anti-infectives (From admission, onward)   None       I have personally reviewed the following labs and images: CBC: Recent Labs  Lab 09/11/20 2019 09/11/20 2037 09/14/20 0650 09/15/20 0008  WBC 5.2  --  5.7 5.8  HGB 14.5 15.0 15.3 13.2  HCT 42.0 44.0 44.4 39.8  MCV 83.0  --  83.6 85.0  PLT 411*  --  355 412*   BMP &GFR Recent Labs  Lab 09/11/20 2039 09/11/20 2159 09/12/20 1035 09/13/20 0255 09/14/20 0650 09/14/20 2322 09/15/20 0008  NA  --    < > 139 132* 136 135 134*  K  --    < > 3.7 4.0 3.9 3.7 3.8  CL  --    < > 101 94* 98 100 97*  CO2  --    < > 28 25 28 27 24   GLUCOSE  --    < > 119* 186* 137* 115* 106*  BUN  --    < > 12 15 14 14 14   CREATININE  --    < > 0.91 0.87 0.85 0.78 0.72  CALCIUM  --    < > 9.7 9.3 9.4 8.8* 8.8*  MG 2.3  --    --  1.8  --  2.0 1.9  PHOS  --   --   --  4.3  --   --   --    < > = values in this interval not displayed.   Estimated Creatinine Clearance: 115.3 mL/min (by C-G formula based on SCr of 0.72 mg/dL). Liver & Pancreas: Recent Labs  Lab 09/11/20 2019 09/13/20 0255  AST 19  --   ALT 17  --   ALKPHOS 70  --   BILITOT 0.7  --   PROT 8.3*  --   ALBUMIN 3.8 3.1*   No results for input(s): LIPASE, AMYLASE in the last 168 hours. No results for input(s): AMMONIA in the last 168 hours. Diabetic: No results for input(s): HGBA1C in the last 72 hours. Recent Labs  Lab 09/14/20 1606 09/14/20 2103 09/15/20 0617 09/15/20 0634 09/15/20 1213  GLUCAP 246* 75 65* 103* 205*   Cardiac Enzymes: Recent Labs  Lab 09/11/20 2039  CKTOTAL 67   No results for input(s): PROBNP in the last 8760 hours. Coagulation Profile: Recent Labs  Lab 09/14/20 0650 09/15/20 0008  INR 1.0 1.1   Thyroid Function Tests: Recent Labs    09/14/20 2322  TSH 1.009   Lipid Profile: No results for input(s): CHOL, HDL, LDLCALC, TRIG, CHOLHDL, LDLDIRECT in the last 72 hours. Anemia Panel: No results for input(s): VITAMINB12, FOLATE, FERRITIN, TIBC, IRON, RETICCTPCT in the last 72 hours. Urine analysis:    Component Value Date/Time   COLORURINE STRAW (A) 09/11/2020 2014   APPEARANCEUR CLEAR 09/11/2020 2014   LABSPEC 1.031 (H) 09/11/2020 2014   PHURINE 5.0 09/11/2020 2014   GLUCOSEU >=500 (A) 09/11/2020 2014   HGBUR NEGATIVE 09/11/2020 2014   BILIRUBINUR NEGATIVE 09/11/2020 2014   BILIRUBINUR neg 04/17/2016 1134   KETONESUR 5 (A) 09/11/2020 2014   PROTEINUR NEGATIVE 09/11/2020 2014   UROBILINOGEN 1.0 04/17/2016 1134   UROBILINOGEN 1.0 11/05/2012 1608   NITRITE NEGATIVE 09/11/2020 2014   LEUKOCYTESUR NEGATIVE 09/11/2020 2014   Sepsis Labs:  Invalid input(s): PROCALCITONIN, LACTICIDVEN  Microbiology: Recent Results (from the past 240 hour(s))  SARS CORONAVIRUS 2 (TAT 6-24 HRS) Nasopharyngeal  Nasopharyngeal Swab     Status: None   Collection Time: 09/12/20 12:57 AM   Specimen: Nasopharyngeal Swab  Result Value Ref Range Status   SARS Coronavirus 2 NEGATIVE NEGATIVE Final    Comment: (NOTE) SARS-CoV-2 target nucleic acids are NOT DETECTED.  The SARS-CoV-2 RNA is generally detectable in upper and lower respiratory specimens during the acute phase of infection. Negative results do not preclude SARS-CoV-2 infection, do not rule out co-infections with other pathogens, and should not be used as the sole basis for treatment or other patient management decisions. Negative results must be combined with clinical observations, patient history, and epidemiological information. The expected result is Negative.  Fact Sheet for Patients: HairSlick.no  Fact Sheet for Healthcare Providers: quierodirigir.com  This test is not yet approved or cleared by the [place] FDA and  has been authorized for detection and/or diagnosis of SARS-CoV-2 by FDA under an Emergency Use Authorization (EUA). This EUA will remain  in effect (meaning this test can be used) for the duration of the COVID-19 declaration under Se ction 564(b)(1) of the Act, 21 U.S.C. section 360bbb-3(b)(1), unless the authorization is terminated or revoked sooner.  Performed at [facility], [address], [place], [place] [address]     Radiology Studies: No results found.    Albert Devaul T. Vuong Musa [facility]  If 7PM-7AM, please contact night-coverage [email or web address] 09/15/2020, 2:43 PM

## 2020-09-15 NOTE — Progress Notes (Signed)
Inpatient Diabetes Program Recommendations  AACE/ADA: New Consensus Statement on Inpatient Glycemic Control (2015)  Target Ranges:  Prepandial:   less than 140 mg/dL      Peak postprandial:   less than 180 mg/dL (1-2 hours)      Critically ill patients:  140 - 180 mg/dL   Lab Results  Component Value Date   GLUCAP 103 (H) 09/15/2020   HGBA1C >15.5 (H) 09/12/2020    Review of Glycemic Control Results for SHREY, BOIKE (MRN [id number]) as of 09/15/2020 09:58  Ref. Range 09/14/2020 21:03 09/15/2020 06:17 09/15/2020 06:34  Glucose-Capillary Latest Ref Range: 70 - 99 mg/dL 75 65 (L) 888 (H)   Diabetes history: DM2 Outpatient Diabetes medications: Levemir 30 units daily Humalog 10-20 units TID  Glipizide 10 mg BID Metformin 500 mg BID Current orders for Inpatient glycemic control:  Levemir 10 units BID Novolog 0-15 units TID  Novolog 3 units TID  Inpatient Diabetes Program Recommendations:    Noted mild hypoglycemia this AM and subsequent insulin adjustment. In agreement with current plan. Following.  Per DM coordinator note from 5/31: "Novolin 70/30 would likely be best for him until he is insured.  They had not heard of 70/30 before and stated they can afford a box of 5 pens for $43."  Thanks, Lujean Rave, MSN, RNC-OB Diabetes Coordinator [phone] (8a-5p)

## 2020-09-15 NOTE — Progress Notes (Signed)
Physical Therapy Treatment Patient Details Name: Jason Stevenson MRN: [id number] DOB: [birth year] Today's Date: 09/15/2020    History of Present Illness Pt is a 34 y/o male admitted secondary to increased L sided twitching and tingling. Workup for possible seizure. MRI+ subacute hemorrhagic infarcts involving the anterior  right frontal lobe and left parieto-occipital cortex as above, with  additional chronic right parietal infarct. Pt also found to be in Hyperosmolar hyperglycemic state. .09/13/20, chronic systolic heart failure w/ new LV thrombus.PMH includes HTN, DM, CHF, nonischemic cardiomyopathy    PT Comments    Patient received in bed, agreeable to PT session. Reports he is feeling okay. Performed bed mobility with mod independence. Sit to stand with min guard. Seated and standing exercises performed and side stepping at edge of bed with occasional single UE support. Patient HR up to 130 max during session. He will continue to benefit from skilled PT while here to improve strength and activity tolerance.     Follow Up Recommendations  No PT follow up     Equipment Recommendations  None recommended by PT    Recommendations for Other Services       Precautions / Restrictions Precautions Precautions: Fall Precaution Comments: Watch HR, EEG monitoring Restrictions Weight Bearing Restrictions: No    Mobility  Bed Mobility Overal bed mobility: Modified Independent Bed Mobility: Supine to Sit;Sit to Supine     Supine to sit: Modified independent (Device/Increase time) Sit to supine: Modified independent (Device/Increase time)   General bed mobility comments: assist for lines only    Transfers Overall transfer level: Needs assistance Equipment used: None Transfers: Sit to/from Stand Sit to Stand: Supervision         General transfer comment: supervision for lines and vital monitoring  Ambulation/Gait Ambulation/Gait assistance: Min guard Gait Distance (Feet): 15  Feet Assistive device: None Gait Pattern/deviations: Step-to pattern Gait velocity: decr   General Gait Details: side stepping back and forth at edge of bed. Marching in place. Min guard for balance assist for lines. HR monitoring. Hr up to 130 max.   Stairs             Wheelchair Mobility    Modified Rankin (Stroke Patients Only) Modified Rankin (Stroke Patients Only) Pre-Morbid Rankin Score: No symptoms Modified Rankin: Moderate disability     Balance Overall balance assessment: Needs assistance Sitting-balance support: Feet supported Sitting balance-Leahy Scale: Normal     Standing balance support: Single extremity supported;During functional activity Standing balance-Leahy Scale: Good Standing balance comment: prefers single UE support with higher level balance, but marching and walking without UE support                            Cognition Arousal/Alertness: Awake/alert Behavior During Therapy: WFL for tasks assessed/performed Overall Cognitive Status: Impaired/Different from baseline Area of Impairment: Problem solving;Awareness;Following commands                       Following Commands: Follows one step commands with increased time Safety/Judgement: Decreased awareness of safety;Decreased awareness of deficits Awareness: Intellectual Problem Solving: Slow processing;Requires verbal cues        Exercises Other Exercises Other Exercises: Marching in place, LAQ, marching seated, heel raises, STS x 5 reps Single UE support at times.    General Comments        Pertinent Vitals/Pain Pain Assessment: No/denies pain    Home Living  Prior Function            PT Goals (current goals can now be found in the care plan section) Acute Rehab PT Goals Patient Stated Goal: to be normal again PT Goal Formulation: With patient Time For Goal Achievement: 09/26/20 Potential to Achieve Goals: Good Progress  towards PT goals: Progressing toward goals    Frequency    Min 4X/week      PT Plan Current plan remains appropriate    Co-evaluation              AM-PAC PT "6 Clicks" Mobility   Outcome Measure  Help needed turning from your back to your side while in a flat bed without using bedrails?: None Help needed moving from lying on your back to sitting on the side of a flat bed without using bedrails?: None Help needed moving to and from a bed to a chair (including a wheelchair)?: A Little Help needed standing up from a chair using your arms (e.g., wheelchair or bedside chair)?: A Little Help needed to walk in hospital room?: A Little Help needed climbing 3-5 steps with a railing? : A Little 6 Click Score: 20    End of Session   Activity Tolerance: Patient tolerated treatment well Patient left: in bed;with call bell/phone within reach;with family/visitor present;with bed alarm set Nurse Communication: Mobility status PT Visit Diagnosis: Other abnormalities of gait and mobility (R26.89)     Time: 0623-7628 PT Time Calculation (min) (ACUTE ONLY): 23 min  Charges:  $Therapeutic Exercise: 23-37 mins                     Alexandru Moorer, PT, GCS 09/15/20,11:43 AM

## 2020-09-15 NOTE — Procedures (Addendum)
Patient Name:Jason Stevenson CBJ:[id number] Epilepsy Attending:Jovoni Borkenhagen Annabelle Harman Referring Physician/Provider:Dr Eligha Bridegroom Duration:09/14/2020 1901 to 6/3/20221901  Patient history:35 year old male presenting with new onset of left sided twitching, left facial droop and severe hyperglycemia.EEG to evaluate for seizure.  Level of alertness:Awake,asleep  AEDs during EEG study:LEV, PHT, OXC  Technical aspects: This EEG study was done with scalp electrodes positioned according to the 10-20 International system of electrode placement. Electrical activity was acquired at a sampling rate of 500Hz  and reviewed with a high frequency filter of 70Hz  and a low frequency filter of 1Hz . EEG data were recorded continuously and digitally stored.   Description: The posterior dominant rhythm consists of8-9Hz  activity of moderate voltage (25-35 uV) seen predominantly in posterior head regions, symmetric and reactive to eye opening and eye closing.Sleep was characterized by orbicularis, sleep spindles (12 to 14 Hz), maximum frontocentral region.Intermittent sharply contoured 2 to 3 Hz delta slowing was also noted in left frontotemporal region. Lateralized periodic discharges were seen arising from right temporo-parietal region,at  1.5Hz   With overriding rhythmicity but no definite evolution. Four seizures without clinical signs were seen arising from right temporo-parietal region,on 09/15/2020 at 0946, 1528, 1608 and 1628 , average duration about 30 seconds.   ABNORMALITY -Seizure without clinical signs, right temporo-parietal region -Lateralized periodic discharges with rhythmicity, right temporo-parietal region ( LPD+R) -Intermittent slow, left frontotemporal region  IMPRESSION: This studyshowed four seizures without clinical signs arising from right temporo-parietal region,on 09/15/2020 at 0946, 1528, 1608 and 1628 , average duration about 30 seconds. There is also evidence of  epileptogenicity arising from right temporo-parietal region, likely secondary to underlying structural abnormality/stroke.There frequency of lateralized periodic discharges as well as overriding rhythmicity is on the ictal-interictal continuum with high potential for seizures. Additionally, there is nonspecific cortical dysfunction in left frontotemporal region.  [name] 

## 2020-09-15 NOTE — Discharge Instructions (Signed)
Information on my medicine - Coumadin®   (Warfarin) ° °Why was Coumadin prescribed for you? °Coumadin was prescribed for you because you have a blood clot or a medical condition that can cause an increased risk of forming blood clots. Blood clots can cause serious health problems by blocking the flow of blood to the heart, lung, or brain. Coumadin can prevent harmful blood clots from forming. °As a reminder your indication for Coumadin is:   LV thrombus ° °What test will check on my response to Coumadin? °While on Coumadin (warfarin) you will need to have an INR test regularly to ensure that your dose is keeping you in the desired range. The INR (international normalized ratio) number is calculated from the result of the laboratory test called prothrombin time (PT). ° °If an INR APPOINTMENT HAS NOT ALREADY BEEN MADE FOR YOU please schedule an appointment to have this lab work done by your health care provider within 7 days. °Your INR goal is usually a number between:  2 to 3 or your provider may give you a more narrow range like 2-2.5.  Ask your health care provider during an office visit what your goal INR is. ° °What  do you need to  know  About  COUMADIN? °Take Coumadin (warfarin) exactly as prescribed by your healthcare provider about the same time each day.  DO NOT stop taking without talking to the doctor who prescribed the medication.  Stopping without other blood clot prevention medication to take the place of Coumadin may increase your risk of developing a new clot or stroke.  Get refills before you run out. ° °What do you do if you miss a dose? °If you miss a dose, take it as soon as you remember on the same day then continue your regularly scheduled regimen the next day.  Do not take two doses of Coumadin at the same time. ° °Important Safety Information °A possible side effect of Coumadin (Warfarin) is an increased risk of bleeding. You should call your healthcare provider right away if you experience  any of the following: °? Bleeding from an injury or your nose that does not stop. °? Unusual colored urine (red or dark brown) or unusual colored stools (red or black). °? Unusual bruising for unknown reasons. °? A serious fall or if you hit your head (even if there is no bleeding). ° °Some foods or medicines interact with Coumadin® (warfarin) and might alter your response to warfarin. To help avoid this: °? Eat a balanced diet, maintaining a consistent amount of Vitamin K. °? Notify your provider about major diet changes you plan to make. °? Avoid alcohol or limit your intake to 1 drink for women and 2 drinks for men per day. °(1 drink is 5 oz. wine, 12 oz. beer, or 1.5 oz. liquor.) ° °Make sure that ANY health care provider who prescribes medication for you knows that you are taking Coumadin (warfarin).  Also make sure the healthcare provider who is monitoring your Coumadin knows when you have started a new medication including herbals and non-prescription products. ° °Coumadin® (Warfarin)  Major Drug Interactions  °Increased Warfarin Effect Decreased Warfarin Effect  °Alcohol (large quantities) °Antibiotics (esp. Septra/Bactrim, Flagyl, Cipro) °Amiodarone (Cordarone) °Aspirin (ASA) °Cimetidine (Tagamet) °Megestrol (Megace) °NSAIDs (ibuprofen, naproxen, etc.) °Piroxicam (Feldene) °Propafenone (Rythmol SR) °Propranolol (Inderal) °Isoniazid (INH) °Posaconazole (Noxafil) Barbiturates (Phenobarbital) °Carbamazepine (Tegretol) °Chlordiazepoxide (Librium) °Cholestyramine (Questran) °Griseofulvin °Oral Contraceptives °Rifampin °Sucralfate (Carafate) °Vitamin K  ° °Coumadin® (Warfarin) Major Herbal Interactions  °Increased Warfarin   Effect Decreased Warfarin Effect  °Garlic °Ginseng °Ginkgo biloba Coenzyme Q10 °Green tea °St. John’s wort   ° °Coumadin® (Warfarin) FOOD Interactions  °Eat a consistent number of servings per week of foods HIGH in Vitamin K °(1 serving = ½ cup)  °Collards (cooked, or boiled & drained) °Kale  (cooked, or boiled & drained) °Mustard greens (cooked, or boiled & drained) °Parsley *serving size only = ¼ cup °Spinach (cooked, or boiled & drained) °Swiss chard (cooked, or boiled & drained) °Turnip greens (cooked, or boiled & drained)  °Eat a consistent number of servings per week of foods MEDIUM-HIGH in Vitamin K °(1 serving = 1 cup)  °Asparagus (cooked, or boiled & drained) °Broccoli (cooked, boiled & drained, or raw & chopped) °Brussel sprouts (cooked, or boiled & drained) *serving size only = ½ cup °Lettuce, raw (green leaf, endive, romaine) °Spinach, raw °Turnip greens, raw & chopped  ° °These websites have more information on Coumadin (warfarin):  www.coumadin.com; °www.ahrq.gov/consumer/coumadin.htm; ° ° ° °

## 2020-09-15 NOTE — Progress Notes (Addendum)
Advanced Heart Failure Rounding Note  PCP-Cardiologist: Charlton Haws, MD  [facility]: Dr. Shirlee Latch   Subjective:   On heparin drip. INR 1.1   Yesterday losartan started and coreg was increased to  6.25 but he never got 6.25 mg. He was also given IV 2.5 mg metoprolol was given.   Earlier this morning coreg 12.5 mg twice a day   Feels ok today. No shortness of breath.     Objective:   Weight Range: 64.5 kg Body mass index is 19.85 kg/m.   Vital Signs:   Temp:  [98.4 F (36.9 C)-99.6 F (37.6 C)] 99.2 F (37.3 C) (06/03 0730) Pulse Rate:  [101-136] 129 (06/03 0800) Resp:  [14-22] 18 (06/03 0800) BP: (99-148)/(79-129) 121/91 (06/03 0730) SpO2:  [92 %-99 %] 96 % (06/03 0800) Last BM Date: 09/14/20  Weight change: Filed Weights   09/13/20 1100  Weight: 64.5 kg    Intake/Output:   Intake/Output Summary (Last 24 hours) at 09/15/2020 1010 Last data filed at 09/15/2020 0921 Gross per 24 hour  Intake 1236.43 ml  Output 450 ml  Net 786.43 ml      Physical Exam  General:  Standing in the room. EEG under way.  HEENT: normal Neck: supple. no JVD. Carotids 2+ bilat; no bruits. No lymphadenopathy or thryomegaly appreciated. Cor: PMI nondisplaced. Regular rate & rhythm. No rubs, gallops or murmurs. Lungs: clear Abdomen: soft, nontender, nondistended. No hepatosplenomegaly. No bruits or masses. Good bowel sounds. Extremities: no cyanosis, clubbing, rash, edema Neuro: alert & orientedx3, cranial nerves grossly intact. moves all 4 extremities w/o difficulty. Affect pleasant    Telemetry   Sinus Tach 100s   EKG    No new EKG to review   Labs    CBC Recent Labs    09/14/20 0650 09/15/20 0008  WBC 5.7 5.8  HGB 15.3 13.2  HCT 44.4 39.8  MCV 83.6 85.0  PLT 355 412*   Basic Metabolic Panel Recent Labs    63/14/97 0255 09/14/20 0650 09/14/20 2322 09/15/20 0008  NA 132*   < > 135 134*  K 4.0   < > 3.7 3.8  CL 94*   < > 100 97*  CO2 25   < > 27 24  GLUCOSE  186*   < > 115* 106*  BUN 15   < > 14 14  CREATININE 0.87   < > 0.78 0.72  CALCIUM 9.3   < > 8.8* 8.8*  MG 1.8  --  2.0 1.9  PHOS 4.3  --   --   --    < > = values in this interval not displayed.   Liver Function Tests Recent Labs    09/13/20 0255  ALBUMIN 3.1*   No results for input(s): LIPASE, AMYLASE in the last 72 hours. Cardiac Enzymes No results for input(s): CKTOTAL, CKMB, CKMBINDEX, TROPONINI in the last 72 hours.  BNP: BNP (last 3 results) No results for input(s): BNP in the last 8760 hours.  ProBNP (last 3 results) No results for input(s): PROBNP in the last 8760 hours.   D-Dimer No results for input(s): DDIMER in the last 72 hours. Hemoglobin A1C No results for input(s): HGBA1C in the last 72 hours. Fasting Lipid Panel No results for input(s): CHOL, HDL, LDLCALC, TRIG, CHOLHDL, LDLDIRECT in the last 72 hours. Thyroid Function Tests Recent Labs    09/14/20 2322  TSH 1.009    Other results:   Imaging    No results found.   Medications:  Scheduled Medications: .  stroke: mapping our early stages of recovery book   Does not apply Once  . aspirin EC  81 mg Oral Daily  . atorvastatin  80 mg Oral Daily  . carvedilol  12.5 mg Oral BID WC  . ezetimibe  10 mg Oral Daily  . furosemide  20 mg Oral Daily  . insulin aspart  0-15 Units Subcutaneous TID WC  . insulin aspart  0-5 Units Subcutaneous QHS  . insulin aspart  6 Units Subcutaneous TID WC  . insulin detemir  25 Units Subcutaneous BID  . levETIRAcetam  1,000 mg Oral BID  . living well with diabetes book   Does not apply Once  . losartan  12.5 mg Oral Daily  . OXcarbazepine  600 mg Oral BID  . phenytoin  100 mg Oral TID  . spironolactone  12.5 mg Oral Daily  . Warfarin - Pharmacist Dosing Inpatient   Does not apply q1600    Infusions: . heparin 1,050 Units/hr (09/15/20 0343)    PRN Medications: acetaminophen **OR** acetaminophen (TYLENOL) oral liquid 160 mg/5 mL **OR** acetaminophen,  dextrose, ondansetron (ZOFRAN) IV     Assessment/Plan   1. Chronic systolic CHF: Nonischemic cardiomyopathy known for years.  Normal coronary CTA in 2014.  Echo this admission with EF 10-15%, LV thrombus, moderate RV dysfunction, dilated IVC. With markedly high LDL and poorly controlled glucose, concern for development of CAD though no chest pain.  On exam, he does not look volume overloaded though IVC dilated on echo.  SBP 110-130s. Continue titration of GDMT  - Coreg increased earlier today 12.5 mg twice a day. Watch closely do not want to drop BP. -  Could consider corlanor if heart rate remains elevated.  - Continue Spironolactone 12.5 daily.  - Continue Lasix 20 mg daily.  - Continue Losartan 12.5 daily  - Renal function stable.  - Hgb A1c too high for SGLT2i  - Consider right/left cath in future given risk factors though coronary CTA was negative in the past.  -Would hold off for now given need for anticoagulation and recent CVA.  - Would involve paramedicine at discharge. Referral order sent  2. LV thrombus: With associated CVA.  - Restarted anticoagulation.    - INR 1.1 today. Continue heparin gtt until therapeutic  3. 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  Still with some dysarthria.  - Anticoagulation ok per neurology.  - Atorvastatin.  4. 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  Now on Keppra.  EEG underway.  5.  Hyperlipidemia: Markedly high LDL.   - Continue atorvastatin 80 mg daily.  - Zetia 10 added  - F/u LP 608 weeks. Lipid Clinic referral if LDL not at goal after improved compliance  6.  Diabetes: Poor control. Hgb A1c >15.5  - insulin per primary team.    HFSW --following--> CSW obtained the patients signature for the disability and Food Stamp referral for the [facility] to reach out to them and patient and spouse are both in agreement to move forward with referrals. No medical insurance.   Will need meds prior to d/c  Length of  Stay: 3  Amy Clegg, NP  09/15/2020, 10:10 AM  Advanced Heart Failure Team Pager [phone] (M-F; 7a - 5p)  Please contact [facility] for night-coverage after hours (5p -7a ) and weekends on [email or web address]  Patient seen with NP, agree with the above note.   Still with sinus tachy, rate 110s-120s.  No dyspnea or chest  pain.  No further seizures, EEG ongoing.   General: NAD Neck: No JVD, no thyromegaly or thyroid nodule.  Lungs: Clear to auscultation bilaterally with normal respiratory effort. CV: Lateral PMI.  Heart tachy, regular S1/S2, no S3/S4, no murmur.  No peripheral edema.   Abdomen: Soft, nontender, no hepatosplenomegaly, no distention.  Skin: Intact without lesions or rashes.  Neurologic: Alert and oriented x 3.  Psych: Normal affect. Extremities: No clubbing or cyanosis.  HEENT: Normal.   He is on Coreg 12.5 mg bid with ongoing ST, stable BP.  Not volume overloaded on exam, not low output by symptoms.  - Add Corlanor 5 mg bid.   LV thrombus with recent CVA and seizures related to CVA.  On heparin gtt bridging to therapeutic INR, INR 1.1 today.   Marca Ancona 09/15/2020 1:29 PM

## 2020-09-15 NOTE — Progress Notes (Signed)
NO SKIN BREAKDOWN AT T3 C3 AND CZ

## 2020-09-15 NOTE — Progress Notes (Signed)
STROKE TEAM PROGRESS NOTE   INTERVAL HISTORY Wife at bedside. Pt lying in bed, awake alert and interactive, smiling with a slight lethargy.  EEG no seizure since 7:30 PM yesterday.  Continue Dilantin, Trileptal and Keppra.  Dilantin level 18.4 this AM.  INR 1.1 today, still on heparin and Coumadin.    Vitals:   09/15/20 0800 09/15/20 0830 09/15/20 1212 09/15/20 1633  BP:  (!) 112/95 109/84 108/88  Pulse: (!) 129 (!) 136 (!) 122 (!) 113  Resp: 18 18 16 16   Temp:   98 F (36.7 C) 98.5 F (36.9 C)  TempSrc:   Oral Oral  SpO2: 96% 97% 100% 99%  Weight:      Height:       CBC:  Recent Labs  Lab 09/14/20 0650 09/15/20 0008  WBC 5.7 5.8  HGB 15.3 13.2  HCT 44.4 39.8  MCV 83.6 85.0  PLT 355 412*   Basic Metabolic Panel:  Recent Labs  Lab 09/13/20 0255 09/14/20 0650 09/14/20 2322 09/15/20 0008  NA 132*   < > 135 134*  K 4.0   < > 3.7 3.8  CL 94*   < > 100 97*  CO2 25   < > 27 24  GLUCOSE 186*   < > 115* 106*  BUN 15   < > 14 14  CREATININE 0.87   < > 0.78 0.72  CALCIUM 9.3   < > 8.8* 8.8*  MG 1.8  --  2.0 1.9  PHOS 4.3  --   --   --    < > = values in this interval not displayed.   Lipid Panel:  Recent Labs  Lab 09/12/20 0201  CHOL 380*  TRIG 149  HDL 45  CHOLHDL 8.4  VLDL 30  LDLCALC 09/14/20*   HgbA1c:  Recent Labs  Lab 09/12/20 0202  HGBA1C >15.5*   Urine Drug Screen:  Recent Labs  Lab 09/14/20 2322  LABOPIA NONE DETECTED  COCAINSCRNUR NONE DETECTED  LABBENZ NONE DETECTED  AMPHETMU NONE DETECTED  THCU NONE DETECTED  LABBARB NONE DETECTED    Alcohol Level No results for input(s): ETH in the last 168 hours.  IMAGING past 24 hours  CT ANGIO HEAD NECK W WO CM Result Date: 09/12/2020 IMPRESSION: No large vessel occlusion or significant proximal stenosis in the head and neck.   CT Head Wo Contrast Result Date: 09/11/2020 IMPRESSION:  1. No acute intracranial hemorrhage.  2. Probable subacute infarct in the right frontal lobe. Further evaluation  with MRI is recommended. Old right parietal infarct.   MR Brain W and Wo Contrast Result Date: 09/12/2020 IMPRESSION:  1. Probable subacute hemorrhagic infarcts involving the anterior right frontal lobe and left parieto-occipital cortex as above, with additional chronic right parietal infarct.  2. Superimposed gyriform diffusion abnormality and enhancement involving the right frontotemporal region, favored to reflect changes of acute seizure. Correlation with EEG recommended. Possible ischemic change would be the primary differential consideration.  3. Abnormal symmetric T2/FLAIR signal abnormality involving the caudate and lentiform nuclei bilaterally, nonspecific, but most suggestive of superimposed/concomitant toxic metabolic derangement or other toxic insult. Correlation with history and laboratory values recommended.  4. A short interval follow-up brain MRI to ensure that these changes resolve is recommended.   EEG adult Result Date: 09/12/2020 IMPRESSION: This study is within normal limits. No seizures or epileptiform discharges were seen throughout the recording. Priyanka 09/14/2020    PHYSICAL EXAM HEENT-  [place]/AT.   Lungs- Respirations unlabored Cardiovascular- regular  rate and rhythm Extremities- No edema Neuro - awake, alert, fully orientated x3.  No aphasia, fluent language, follows some commands, able to name and repeat. No gaze palsy, tracking bilaterally, visual field full, PERRL. No facial droop. Tongue midline. Bilateral UEs 5/5, no drift. Bilaterally LEs 5/5, no drift. Sensation symmetrical bilaterally, b/l FTN intact, gait not tested.   ASSESSMENT/PLAN [name] is a 35 y.o. male with history of  DM2, nonischemic cardiomyopathy, chronic systolic CHF, HLD and HTN who presents to the ED with an 8 day history of intermittent left sided twitching, left facial droop, and severe hyperglycemia.   Seizure   Intermittent left sided twitching x1 week  MRI: Superimposed  gyriform diffusion abnormality and enhancement involving the right frontotemporal region, favored to reflect changes of acute seizure.  Repeat MRI in 1 week  Spot EEG: no seizures or epileptiform dischages  LTM EEG 6/1 - seizures without clinical signs arising from right temporo-parietal region, average 4-5/hour, average duration about 30 seconds.  There is also evidence of nonspecific cortical dysfunction in left frontotemporal region.  LTM EEG 6/2 - seizures without clinical signs arising from right temporo-parietal region,average1-2/hour, average duration about 30 seconds  LTM EEG 6/3 - no more seizure since 1930 on 09/14/20  Continue Keppra 1g q 12 hrs  Continue Dilantin 100mg  Q8h  Continue trileptal 600mg  bid  Dilantin level 21.0->18.4  LV thrombus Severe cardiomyopathy  Echo July 2020 showed EF 15-20%, LV diffuse hypokinesis. No significant change from  May 2017  On coreg and entresto 49-51mg  bid PTA, but not compliance  This admission 2D echo showed LV thrombus 0.9 x 0.9, EF 10 to 15%.  Heart failure team on board  Currently on aspirin 81 and heparin IV bridge to Coumadin with INR goal 2 to 3  On Lasix, Coreg and spironolactone  Medication compliance again emphasized  Chronic / subacute strokes:  Probable chronic/subacute hemorrhagic infarcts involving the anterior right frontal lobe and left parieto-occipital cortex due to LV thrombus and cardiomyopathy with low EF  CT head No acute abnormality. probable subacute infarct in the right frontal lobe  CTA head & neck: no large vessel occlusion or significant proximal stenosis in the head and neck.   MRI: probable subacute hemorrhagic infarcts involving the anterior right frontal lobe and left parieto-occipital cortex  2D Echo EF 10 to 50% with LV thrombus  SARS coronavirus negative  LDL 305  HgbA1c > 15.5  UDS neg  VTE prophylaxis - scds Diet: heart healthy  No antithrombotic prior to admission, now on  aspirin 81 mg daily and heparin IV bridged to Coumadin.   Therapy recommendations:  none  Disposition:  pending  Hypertension  Home meds:  Coreg 9.375mg  bid  Stable  Now on Coreg, Lasix, spironolactone . Long-term BP goal normotensive  Hyperlipidemia  Home meds:  crestor 40mg  daily, not compliant   LDL 305, goal < 70  Add Lipitor 80 mg daily   High intensity statin   Continue statin at discharge  Diabetes type II Uncontrolled/DKA  Home meds:  Glipizide 10mg  bid, levemir 30 units daily, lispro ss, metformin 500mg  bid - noncompliance  stopped checking his fingerstick glucose readings about 2 weeks ago  blood glucose was > 600 in the ED  Insulin drip -> off  Diabetes coordinator: recommendations for transition  Levemir 18 units 2 hours prior to discontinuing IV insulin  Novolog 0-15 units TID and 0-5 units QHS  Novolog 3 units TID with meals if eats at least 50%  HgbA1c > 15.5, goal < 7.0  CBGs  SSI  Other Stroke Risk Factors  Prior Marijuana use. Quit 2013. UDS negative  ETOH use, advised to drink no more than 1 drink per day  Family hx stroke (aunt)  Coronary artery disease: nonischemic cardiomyopathy  Other Active Problems  Chronic pain: takes tylenol, and ibuprofen at home at home  Neuropathy: takes gabapentin  Hospital day # 3  Marvel Plan, MD PhD Stroke Neurology 09/15/2020 5:49 PM  To contact Stroke Continuity provider, please refer to [email or web address]. After hours, contact General Neurology

## 2020-09-16 DIAGNOSIS — I5042 Chronic combined systolic (congestive) and diastolic (congestive) heart failure: Secondary | ICD-10-CM

## 2020-09-16 DIAGNOSIS — E782 Mixed hyperlipidemia: Secondary | ICD-10-CM

## 2020-09-16 DIAGNOSIS — I513 Intracardiac thrombosis, not elsewhere classified: Secondary | ICD-10-CM

## 2020-09-16 DIAGNOSIS — I10 Essential (primary) hypertension: Secondary | ICD-10-CM

## 2020-09-16 LAB — CBC
HCT: 40.7 % (ref 39.0–52.0)
Hemoglobin: 13.7 g/dL (ref 13.0–17.0)
MCH: 28.6 pg (ref 26.0–34.0)
MCHC: 33.7 g/dL (ref 30.0–36.0)
MCV: 85 fL (ref 80.0–100.0)
Platelets: 319 10*3/uL (ref 150–400)
RBC: 4.79 MIL/uL (ref 4.22–5.81)
RDW: 11.7 % (ref 11.5–15.5)
WBC: 6.5 10*3/uL (ref 4.0–10.5)
nRBC: 0 % (ref 0.0–0.2)

## 2020-09-16 LAB — BASIC METABOLIC PANEL
Anion gap: 10 (ref 5–15)
BUN: 11 mg/dL (ref 6–20)
CO2: 26 mmol/L (ref 22–32)
Calcium: 9 mg/dL (ref 8.9–10.3)
Chloride: 100 mmol/L (ref 98–111)
Creatinine, Ser: 0.87 mg/dL (ref 0.61–1.24)
GFR, Estimated: >60 mL/min (ref >60)
Glucose, Bld: 81 mg/dL (ref 70–99)
Potassium: 3.7 mmol/L (ref 3.5–5.1)
Sodium: 136 mmol/L (ref 135–145)

## 2020-09-16 LAB — MAGNESIUM: Magnesium: 1.9 mg/dL (ref 1.7–2.4)

## 2020-09-16 LAB — PHENYTOIN LEVEL, TOTAL: Phenytoin Lvl: 15.2 ug/mL (ref 10.0–20.0)

## 2020-09-16 LAB — GLUCOSE, CAPILLARY
Glucose-Capillary: 115 mg/dL — ABNORMAL HIGH (ref 70–99)
Glucose-Capillary: 160 mg/dL — ABNORMAL HIGH (ref 70–99)
Glucose-Capillary: 134 mg/dL — ABNORMAL HIGH (ref 70–99)
Glucose-Capillary: 239 mg/dL — ABNORMAL HIGH (ref 70–99)
Glucose-Capillary: 151 mg/dL — ABNORMAL HIGH (ref 70–99)

## 2020-09-16 LAB — PROTIME-INR
INR: 2 — ABNORMAL HIGH (ref 0.8–1.2)
INR: 2.3 — ABNORMAL HIGH (ref 0.8–1.2)
Prothrombin Time: 22.2 seconds — ABNORMAL HIGH (ref 11.4–15.2)
Prothrombin Time: 25.3 seconds — ABNORMAL HIGH (ref 11.4–15.2)

## 2020-09-16 LAB — HEPARIN LEVEL (UNFRACTIONATED): Heparin Unfractionated: 0.49 IU/mL (ref 0.30–0.70)

## 2020-09-16 MED ORDER — LEVETIRACETAM IN NACL 1000 MG/100ML IV SOLN
1000.0000 mg | Freq: Once | INTRAVENOUS | Status: AC
  Administered 2020-09-16: 1000 mg via INTRAVENOUS
  Filled 2020-09-16: qty 100

## 2020-09-16 MED ORDER — LEVETIRACETAM 750 MG PO TABS
1500.0000 mg | ORAL_TABLET | Freq: Two times a day (BID) | ORAL | Status: DC
  Administered 2020-09-16 – 2020-09-19 (×6): 1500 mg via ORAL
  Filled 2020-09-16 (×6): qty 2

## 2020-09-16 MED ORDER — WARFARIN SODIUM 2.5 MG PO TABS
2.5000 mg | ORAL_TABLET | Freq: Once | ORAL | Status: AC
  Administered 2020-09-16: 2.5 mg via ORAL
  Filled 2020-09-16: qty 1

## 2020-09-16 MED ORDER — POTASSIUM CHLORIDE CRYS ER 20 MEQ PO TBCR
40.0000 meq | EXTENDED_RELEASE_TABLET | Freq: Once | ORAL | Status: AC
  Administered 2020-09-16: 40 meq via ORAL
  Filled 2020-09-16: qty 2

## 2020-09-16 MED ORDER — MAGNESIUM SULFATE IN D5W 1-5 GM/100ML-% IV SOLN
1.0000 g | Freq: Once | INTRAVENOUS | Status: AC
  Administered 2020-09-16: 1 g via INTRAVENOUS
  Filled 2020-09-16: qty 100

## 2020-09-16 MED ORDER — METOPROLOL TARTRATE 50 MG PO TABS
50.0000 mg | ORAL_TABLET | Freq: Three times a day (TID) | ORAL | Status: DC
  Administered 2020-09-16 – 2020-09-17 (×3): 50 mg via ORAL
  Filled 2020-09-16 (×3): qty 1

## 2020-09-16 NOTE — Procedures (Signed)
Patient Name:Jason Stevenson TAV:[id number] Epilepsy Attending:Kayleanna Lorman Annabelle Harman Referring Physician/Provider:Dr Eligha Bridegroom Duration:09/15/2020 1901 to 6/4/20221901  Patient history:35 year old male presenting with new onset of left sided twitching, left facial droop and severe hyperglycemia.EEG to evaluate for seizure.  Level of alertness:Awake,asleep  AEDs during EEG study:LEV, PHT, OXC  Technical aspects: This EEG study was done with scalp electrodes positioned according to the 10-20 International system of electrode placement. Electrical activity was acquired at a sampling rate of 500Hz  and reviewed with a high frequency filter of 70Hz  and a low frequency filter of 1Hz . EEG data were recorded continuously and digitally stored.   Description: The posterior dominant rhythm consists of8-9Hz  activity of moderate voltage (25-35 uV) seen predominantly in posterior head regions, symmetric and reactive to eye opening and eye closing.Sleep was characterized by orbicularis, sleep spindles (12 to 14 Hz), maximum frontocentral region.Intermittent sharply contoured 2 to 3 Hz delta slowing was also noted in left frontotemporal region.  Fourteen seizures without clinical signs were seen arising from right temporo-parietal region , average duration about 12-15 seconds, last seizure on 09/16/2020 at 1847. Brief ictal-interictal rhythmic discharges were also noted in  right temporo-parietal region  ABNORMALITY -Seizure without clinical signs, right temporo-parietal region - Brief ictal-interictal rhythmic discharges, right temporo-parietal region ( BIRDS) -Intermittent slow, left frontotemporal region  IMPRESSION: This studyshowed fourteen seizures without clinical signs arising from right temporo-parietal region, average duration about 12-15 seconds, last seizure on 09/16/2020 at 1847. There are also brief ictal-interictal rhythmic discharges ( BIRDS) in right temporo-parietal  region which is on the ictal-interictal continuum with high potential for seizures. Additionally, there is nonspecific cortical dysfunction in left frontotemporal region.  Ozil Stettler 

## 2020-09-16 NOTE — Progress Notes (Signed)
Progress Note  Patient Name: Jason Stevenson Date of Encounter: 09/16/2020  [facility] Cardiologist: Charlton Haws, MD   Subjective   Feeling well.  Denies chest pain or shortness of breath.  He denies any palpitations.  Inpatient Medications    Scheduled Meds: .  stroke: mapping our early stages of recovery book   Does not apply Once  . aspirin EC  81 mg Oral Daily  . atorvastatin  80 mg Oral Daily  . carvedilol  12.5 mg Oral BID WC  . ezetimibe  10 mg Oral Daily  . furosemide  20 mg Oral Daily  . insulin aspart  0-15 Units Subcutaneous TID WC  . insulin aspart  0-5 Units Subcutaneous QHS  . insulin aspart  6 Units Subcutaneous TID WC  . insulin detemir  25 Units Subcutaneous BID  . ivabradine  5 mg Oral BID WC  . levETIRAcetam  1,500 mg Oral BID  . living well with diabetes book   Does not apply Once  . losartan  12.5 mg Oral Daily  . OXcarbazepine  600 mg Oral BID  . phenytoin  100 mg Oral TID  . spironolactone  12.5 mg Oral Daily  . Warfarin - Pharmacist Dosing Inpatient   Does not apply q1600   Continuous Infusions: . heparin 1,050 Units/hr (09/15/20 2222)  . levETIRAcetam     PRN Meds: acetaminophen **OR** acetaminophen (TYLENOL) oral liquid 160 mg/5 mL **OR** acetaminophen, dextrose, ondansetron (ZOFRAN) IV   Vital Signs    Vitals:   09/15/20 1929 09/15/20 2352 09/16/20 0400 09/16/20 0744  BP: 93/70 (!) 113/96 (!) 115/96 (!) 131/94  Pulse: (!) 114 (!) 119 (!) 118 (!) 122  Resp: 20 18 14 18   Temp: 98.3 F (36.8 C) 98.5 F (36.9 C) 98.3 F (36.8 C) 97.9 F (36.6 C)  TempSrc: Oral Oral Oral Oral  SpO2: 100% 98% 100% 100%  Weight:      Height:        Intake/Output Summary (Last 24 hours) at 09/16/2020 1048 Last data filed at 09/16/2020 0519 Gross per 24 hour  Intake 578.44 ml  Output 300 ml  Net 278.44 ml   Last 3 Weights 09/13/2020 12/02/2019 08/06/2019  Weight (lbs) 142 lb 4.8 oz 167 lb 167 lb  Weight (kg) 64.547 kg 75.751 kg 75.751 kg       Telemetry    Sinus tachycardia.  Rate in the 110-120s.- Personally Reviewed  ECG    n/a - Personally Reviewed  Physical Exam   VS:  BP (!) 131/94 (BP Location: Right Arm)   Pulse (!) 122   Temp 97.9 F (36.6 C) (Oral)   Resp 18   Ht 5\' 11"  (1.803 m)   Wt 64.5 kg   SpO2 100%   BMI 19.85 kg/m  , BMI Body mass index is 19.85 kg/m. GENERAL:  Well appearing HEENT: Pupils equal round and reactive, fundi not visualized, oral mucosa unremarkable NECK:  No jugular venous distention, waveform within normal limits, carotid upstroke brisk and symmetric, no bruits LUNGS:  Clear to auscultation bilaterally HEART:  Tachycardic.  Regular rhythm. PMI not displaced or sustained,S1 and S2 within normal limits, no S3, no S4, no clicks, no rubs, no murmurs ABD:  Flat, positive bowel sounds normal in frequency in pitch, no bruits, no rebound, no guarding, no midline pulsatile mass, no hepatomegaly, no splenomegaly EXT:  2 plus pulses throughout, no edema, no cyanosis no clubbing SKIN:  No rashes no nodules NEURO:  Cranial nerves II  through XII grossly intact, motor grossly intact throughout.  Dysarthria PSYCH:  Cognitively intact, oriented to person place and time   Labs    High Sensitivity Troponin:  No results for input(s): TROPONINIHS in the last 720 hours.    Chemistry Recent Labs  Lab 09/11/20 2019 09/11/20 2037 09/13/20 0255 09/14/20 0650 09/14/20 2322 09/15/20 0008 09/16/20 0345  NA 127*   < > 132*   < > 135 134* 136  K 4.8   < > 4.0   < > 3.7 3.8 3.7  CL 88*   < > 94*   < > 100 97* 100  CO2 26   < > 25   < > 27 24 26   GLUCOSE 797*   < > 186*   < > 115* 106* 81  BUN 15   < > 15   < > 14 14 11   CREATININE 1.18   < > 0.87   < > 0.78 0.72 0.87  CALCIUM 9.6   < > 9.3   < > 8.8* 8.8* 9.0  PROT 8.3*  --   --   --   --   --   --   ALBUMIN 3.8  --  3.1*  --   --   --   --   AST 19  --   --   --   --   --   --   ALT 17  --   --   --   --   --   --   ALKPHOS 70  --   --   --    --   --   --   BILITOT 0.7  --   --   --   --   --   --   GFRNONAA >60   < > >60   < > >60 >60 >60  ANIONGAP 13   < > 13   < > 8 13 10    < > = values in this interval not displayed.     Hematology Recent Labs  Lab 09/14/20 0650 09/15/20 0008 09/16/20 0345  WBC 5.7 5.8 6.5  RBC 5.31 4.68 4.79  HGB 15.3 13.2 13.7  HCT 44.4 39.8 40.7  MCV 83.6 85.0 85.0  MCH 28.8 28.2 28.6  MCHC 34.5 33.2 33.7  RDW 11.7 11.7 11.7  PLT 355 412* 319    BNPNo results for input(s): BNP, PROBNP in the last 168 hours.   DDimer No results for input(s): DDIMER in the last 168 hours.   Radiology    No results found.  Cardiac Studies   Echo 09/13/2020: 1. 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  2. Left ventricular ejection fraction, by estimation, is 10-15%. The left  ventricle has severely decreased function. The left ventricle demonstrates  global hypokinesis. The left ventricular internal cavity size was mildly  dilated. Left ventricular  diastolic parameters are indeterminate.  3. Right ventricular systolic function is moderately reduced. The right  ventricular size is normal. Tricuspid regurgitation signal is inadequate  for assessing PA pressure.  4. Left atrial size was mildly dilated.  5. The mitral valve is grossly normal. Mild mitral valve regurgitation.  No evidence of mitral stenosis.  6. The aortic valve is tricuspid. Aortic valve regurgitation is not  visualized. No aortic stenosis is present.  7. The inferior vena cava is dilated in size with <50% respiratory  variability, suggesting right atrial pressure of 15 mmHg.   Patient Profile  35 y.o. male with chronic systolic diastolic heart failure secondary to nonischemic cardiomyopathy, prior stroke, diabetes, and hyperlipidemia admitted with seizures.  Cardiology following for persistent tachycardia.  Assessment & Plan    #Chronic systolic and diastolic heart failure: LVEF 10 to 15%.  He appears euvolemic  though IVC was dilated on echo this admission.  Right atrial pressure was 15 mmHg.  He is laying flat and resting comfortably.  We will switch carvedilol to metoprolol tartrate every 8 hours and titrate as needed for more effective heart rate control.  Plan to consolidate to succinate given his systolic dysfunction.  Continue losartan and spironolactone.  Continue oral Lasix.  He was started on ivabradine.  We will continue for now.  He is not on an SGLT2 inhibitor because his A1c is too high.  Reassess as an outpatient.  #LV thrombus: Present on echo this admission.  Continue IV heparin and he is being loaded with warfarin.  #CVA: Likely ischemic in the setting of LV thrombus.  #Seizures: Related to  Stroke  # Hyperlipidemia:  Likely FH given that his LDL is 305.  Recommend that he be seen by the lipid clinic as an outpatient.  Continue atorvastatin and Zetia for now.       For questions or updates, please contact [facility] Please consult [email or web address] for contact info under        Signed, Chilton Si, MD  09/16/2020, 10:48 AM

## 2020-09-16 NOTE — Plan of Care (Signed)
  Problem: Education: Goal: Knowledge of secondary prevention will improve Outcome: Progressing Goal: Knowledge of patient specific risk factors addressed and post discharge goals established will improve Outcome: Progressing Goal: Individualized Educational Video(s) Outcome: Progressing   Problem: Health Behavior/Discharge Planning: Goal: Ability to manage health-related needs will improve Outcome: Progressing   Problem: Ischemic Stroke/TIA Tissue Perfusion: Goal: Complications of ischemic stroke/TIA will be minimized Outcome: Progressing   

## 2020-09-16 NOTE — Progress Notes (Signed)
LTM EEG hooked up and running - no initial skin breakdown - push button tested - neuro notified. [facility] monitoring.  

## 2020-09-16 NOTE — Progress Notes (Signed)
LTM EEG hooked up and running - no initial skin breakdown - maint. elecrodes CZ, F4 , F7 F8, FP1, T7, T8

## 2020-09-16 NOTE — Progress Notes (Signed)
PROGRESS NOTE  Jason Stevenson QBH:[id number] DOB: 26-Oct-1985   PCP: Marcine Matar, MD  Patient is from: Home.  DOA: 09/11/2020 LOS: 4  Chief complaints: Tingling in the left arm, left face and blank stare  Brief Narrative / Interim history: [name] with PMH of systolic CHF, IDDM-2, HTN, HLD, COVID-19 infection in 07/2020 and noncompliance with medication presenting with tingling in left arm, left face, drooling and blank stare for about 1 week, and admitted for possible seizure, HHS and possible CVA.  In ED, he had left-sided neurologic deficit with dysarthria.  Blood glucose elevated to 797 but not acidotic.  Serum osmolality 323.  Lactic acid 2.2.  CT head with probable subacute infarct in right frontal lobe and old right parietal infarct.  Reportedly, patient's neurologic deficits resolved in ED, and reconsult shifted to possible seizure versus stroke.  He was loaded with Keppra and started on insulin drip.   MRI brain with probable hemorrhagic infarct involving the anterior right frontal lobe and left parietal occipital cortex with additional chronic right parietal infarct, right frontotemporal region enhancement suggesting changes related to acute seizure, possible toxic insult involving the caudate and lentiform nuclei bilaterally.  Cardiology recommended interval follow-up brain MRI.  CTA head and neck without significant finding.  Patient had intermittent seizure activities confirmed on continuous EEG.  Keppra increased to 1000 mg twice daily.  Dilantin and Trileptal added.  Neurology following.  TTE with EF of 10-15%, LV thrombus, moderate RV dysfunction and dilated IVC.   Cardiology following.   Subjective: Seen and examined earlier this morning.  No major events overnight of this morning.  Remains tachycardic with HR in 110s to 120s.  DBP remains elevated.  He is currently sleepy.  Patient's wife at bedside.  She states he did not have a good sleep last night, and went  back to bed after breakfast.  No questions or concerns today.  Objective: Vitals:   09/16/20 0400 09/16/20 0744 09/16/20 1124 09/16/20 1238  BP: (!) 115/96 (!) 131/94 98/80 96/77   Pulse: (!) 118 (!) 122 (!) 119   Resp: 14 18 16    Temp: 98.3 F (36.8 C) 97.9 F (36.6 C) 98 F (36.7 C) 98.4 F (36.9 C)  TempSrc: Oral Oral    SpO2: 100% 100% 100%   Weight:      Height:        Intake/Output Summary (Last 24 hours) at 09/16/2020 1304 Last data filed at 09/16/2020 0519 Gross per 24 hour  Intake 478.44 ml  Output 300 ml  Net 178.44 ml   Filed Weights   09/13/20 1100  Weight: 64.5 kg    Examination:  GENERAL: No apparent distress.  Nontoxic. HEENT: MMM.  Vision and hearing grossly intact.  NECK: Supple.  No apparent JVD.  RESP: On RA.  No IWOB.  Fair aeration bilaterally. CVS:  RRR. Heart sounds normal.  ABD/GI/GU: BS+. Abd soft, NTND.  MSK/EXT:  Moves extremities. No apparent deformity. No edema.  SKIN: no apparent skin lesion or wound NEURO: Sleeping.  No apparent focal neuro deficit. PSYCH: Calm.  No distress or agitation.  Procedures:  None  Microbiology summarized: COVID-19 PCR negative.  Assessment & Plan: Chronic systolic CHF/LV thrombus: TTE with EF of 10-15%, LV thrombus, moderate RV dysfunction and dilated IVC. He has not had subsequent TTE or follow-up.  Supposed to be on Coreg and Entresto but not compliant.  I&O incomplete.  Appears euvolemic on exam. -Advanced HF/cardiology managing -GDMT-metoprolol, losartan, Aldactone and Corlanor -A1c too  high for SGLT2 inhibitors. -Continue statin and Zetia.  Needs follow-up at lipid clinic -Monitor fluid status, renal functions and electrolytes  LV thrombus -Started on warfarin with heparin bridge as above -Monitor PTT/INR  Sinus tachycardia/uncontrolled BP: HR in 120s.  DBP in 100s. -Cardiology changed Coreg to metoprolol for better rate control. -Optimize electrolytes.  Subacute hemorrhagic CVA-presented with  left arm and face tingling.  Noted to have some dysarthria.  Has some left facial droop and residual motor and sensory deficit on the left. CT and MRI with probable subacute hemorrhagic infarct in right frontal and left parietal occipital areas. CTA head and neck basically negative.  He is noncompliant.  A lot of risk factors.  LDL is 305.  TTE with severely reduced LVEF and LV thrombus -Neurology managing-on IV heparin, low-dose aspirin and high intensity statin -Needs follow-up at lipid clinic. -Appreciate PT/OT/SLP eval-no need identified  Seizures: Intermittent seizure activity confirmed on continuous EEG.  Likely due to CVA.  -Neurology managing-on Keppra, Dilantin and Trileptal -Seizure and fall precautions -Continue PT/OT/SLP-no need identified.  Uncontrolled IDDM-2 with HHS and hyperlipidemia: A1c> 15.5% Recent Labs  Lab 09/15/20 1702 09/15/20 1923 09/15/20 2158 09/16/20 0641 09/16/20 1159  GLUCAP 107* 180* 174* 115* 160*  -Continue Lantus 25 units twice daily -Continue NovoLog 6 units 3 times daily with meals -Continue SSI/moderate -High intensity statin as above  Noncompliance -Counseled on this multiple times  History of COVID-19 infection -COVID-19 PCR negative now  Body mass index is 19.85 kg/m.         DVT prophylaxis:  Place and maintain sequential compression device Start: 09/12/20 0141 warfarin (COUMADIN) tablet 2.5 mg  Code Status: Full code Family Communication: Updated patient's wife at bedside  Level of care: Progressive  Status is: Inpatient  Remains inpatient appropriate because:Hemodynamically unstable, IV treatments appropriate due to intensity of illness or inability to take PO and Inpatient level of care appropriate due to severity of illness   Dispo: The patient is from: Home              Anticipated d/c is to: Home              Patient currently is not medically stable to d/c.   Difficult to place patient  No            Consultants:  Neurology Advanced HF team/cardiology   Sch Meds:  Scheduled Meds: .  stroke: mapping our early stages of recovery book   Does not apply Once  . aspirin EC  81 mg Oral Daily  . atorvastatin  80 mg Oral Daily  . ezetimibe  10 mg Oral Daily  . furosemide  20 mg Oral Daily  . insulin aspart  0-15 Units Subcutaneous TID WC  . insulin aspart  0-5 Units Subcutaneous QHS  . insulin aspart  6 Units Subcutaneous TID WC  . insulin detemir  25 Units Subcutaneous BID  . ivabradine  5 mg Oral BID WC  . levETIRAcetam  1,500 mg Oral BID  . living well with diabetes book   Does not apply Once  . losartan  12.5 mg Oral Daily  . metoprolol tartrate  50 mg Oral Q8H  . OXcarbazepine  600 mg Oral BID  . phenytoin  100 mg Oral TID  . spironolactone  12.5 mg Oral Daily  . warfarin  2.5 mg Oral ONCE-1600  . Warfarin - Pharmacist Dosing Inpatient   Does not apply q1600   Continuous Infusions:  PRN Meds:.acetaminophen **OR**  acetaminophen (TYLENOL) oral liquid 160 mg/5 mL **OR** acetaminophen, dextrose, ondansetron (ZOFRAN) IV  Antimicrobials: Anti-infectives (From admission, onward)   None       I have personally reviewed the following labs and images: CBC: Recent Labs  Lab 09/11/20 2019 09/11/20 2037 09/14/20 0650 09/15/20 0008 09/16/20 0345  WBC 5.2  --  5.7 5.8 6.5  HGB 14.5 15.0 15.3 13.2 13.7  HCT 42.0 44.0 44.4 39.8 40.7  MCV 83.0  --  83.6 85.0 85.0  PLT 411*  --  355 412* 319   BMP &GFR Recent Labs  Lab 09/11/20 2039 09/11/20 2159 09/13/20 0255 09/14/20 0650 09/14/20 2322 09/15/20 0008 09/16/20 0345  NA  --    < > 132* 136 135 134* 136  K  --    < > 4.0 3.9 3.7 3.8 3.7  CL  --    < > 94* 98 100 97* 100  CO2  --    < > 25 28 27 24 26   GLUCOSE  --    < > 186* 137* 115* 106* 81  BUN  --    < > 15 14 14 14 11   CREATININE  --    < > 0.87 0.85 0.78 0.72 0.87  CALCIUM  --    < > 9.3 9.4 8.8* 8.8* 9.0  MG 2.3  --  1.8  --  2.0 1.9  1.9  PHOS  --   --  4.3  --   --   --   --    < > = values in this interval not displayed.   Estimated Creatinine Clearance: 106.1 mL/min (by C-G formula based on SCr of 0.87 mg/dL). Liver & Pancreas: Recent Labs  Lab 09/11/20 2019 09/13/20 0255  AST 19  --   ALT 17  --   ALKPHOS 70  --   BILITOT 0.7  --   PROT 8.3*  --   ALBUMIN 3.8 3.1*   No results for input(s): LIPASE, AMYLASE in the last 168 hours. No results for input(s): AMMONIA in the last 168 hours. Diabetic: No results for input(s): HGBA1C in the last 72 hours. Recent Labs  Lab 09/15/20 1702 09/15/20 1923 09/15/20 2158 09/16/20 0641 09/16/20 1159  GLUCAP 107* 180* 174* 115* 160*   Cardiac Enzymes: Recent Labs  Lab 09/11/20 2039  CKTOTAL 67   No results for input(s): PROBNP in the last 8760 hours. Coagulation Profile: Recent Labs  Lab 09/14/20 0650 09/15/20 0008 09/16/20 0345 09/16/20 1049  INR 1.0 1.1 2.0* 2.3*   Thyroid Function Tests: Recent Labs    09/14/20 2322  TSH 1.009   Lipid Profile: No results for input(s): CHOL, HDL, LDLCALC, TRIG, CHOLHDL, LDLDIRECT in the last 72 hours. Anemia Panel: No results for input(s): VITAMINB12, FOLATE, FERRITIN, TIBC, IRON, RETICCTPCT in the last 72 hours. Urine analysis:    Component Value Date/Time   COLORURINE STRAW (A) 09/11/2020 2014   APPEARANCEUR CLEAR 09/11/2020 2014   LABSPEC 1.031 (H) 09/11/2020 2014   PHURINE 5.0 09/11/2020 2014   GLUCOSEU >=500 (A) 09/11/2020 2014   HGBUR NEGATIVE 09/11/2020 2014   BILIRUBINUR NEGATIVE 09/11/2020 2014   BILIRUBINUR neg 04/17/2016 1134   KETONESUR 5 (A) 09/11/2020 2014   PROTEINUR NEGATIVE 09/11/2020 2014   UROBILINOGEN 1.0 04/17/2016 1134   UROBILINOGEN 1.0 11/05/2012 1608   NITRITE NEGATIVE 09/11/2020 2014   LEUKOCYTESUR NEGATIVE 09/11/2020 2014   Sepsis Labs: Invalid input(s): PROCALCITONIN, LACTICIDVEN  Microbiology: Recent Results (from the past 240 hour(s))  SARS  CORONAVIRUS 2 (TAT 6-24  HRS) Nasopharyngeal Nasopharyngeal Swab     Status: None   Collection Time: 09/12/20 12:57 AM   Specimen: Nasopharyngeal Swab  Result Value Ref Range Status   SARS Coronavirus 2 NEGATIVE NEGATIVE Final    Comment: (NOTE) SARS-CoV-2 target nucleic acids are NOT DETECTED.  The SARS-CoV-2 RNA is generally detectable in upper and lower respiratory specimens during the acute phase of infection. Negative results do not preclude SARS-CoV-2 infection, do not rule out co-infections with other pathogens, and should not be used as the sole basis for treatment or other patient management decisions. Negative results must be combined with clinical observations, patient history, and epidemiological information. The expected result is Negative.  Fact Sheet for Patients: HairSlick.no  Fact Sheet for Healthcare Providers: quierodirigir.com  This test is not yet approved or cleared by the [place] FDA and  has been authorized for detection and/or diagnosis of SARS-CoV-2 by FDA under an Emergency Use Authorization (EUA). This EUA will remain  in effect (meaning this test can be used) for the duration of the COVID-19 declaration under Se ction 564(b)(1) of the Act, 21 U.S.C. section 360bbb-3(b)(1), unless the authorization is terminated or revoked sooner.  Performed at [facility], [address] [place], [place] [address]     Radiology Studies: No results found.    Dartagnan Beavers T. Devonda Pequignot [facility]  If 7PM-7AM, please contact night-coverage [email or web address] 09/16/2020, 1:04 PM

## 2020-09-16 NOTE — Progress Notes (Addendum)
STROKE TEAM PROGRESS NOTE   INTERVAL HISTORY Awake, alert and interactive, no lethargy today. EEG with seizures overnight again. Continue Dilantin, Trileptal. Will increase Keppra. Dilantin level is 15.2 today. INR has reached goal 2.0 today. Will d/c the IV heparin bridge.  Vitals:   09/15/20 1929 09/15/20 2352 09/16/20 0400 09/16/20 0744  BP: 93/70 (!) 113/96 (!) 115/96 (!) 131/94  Pulse: (!) 114 (!) 119 (!) 118 (!) 122  Resp: 20 18 14 18   Temp: 98.3 F (36.8 C) 98.5 F (36.9 C) 98.3 F (36.8 C) 97.9 F (36.6 C)  TempSrc: Oral Oral Oral Oral  SpO2: 100% 98% 100% 100%  Weight:      Height:       CBC:  Recent Labs  Lab 09/15/20 0008 09/16/20 0345  WBC 5.8 6.5  HGB 13.2 13.7  HCT 39.8 40.7  MCV 85.0 85.0  PLT 412* 319   Basic Metabolic Panel:  Recent Labs  Lab 09/13/20 0255 09/14/20 0650 09/15/20 0008 09/16/20 0345  NA 132*   < > 134* 136  K 4.0   < > 3.8 3.7  CL 94*   < > 97* 100  CO2 25   < > 24 26  GLUCOSE 186*   < > 106* 81  BUN 15   < > 14 11  CREATININE 0.87   < > 0.72 0.87  CALCIUM 9.3   < > 8.8* 9.0  MG 1.8   < > 1.9 1.9  PHOS 4.3  --   --   --    < > = values in this interval not displayed.   Lipid Panel:  Recent Labs  Lab 09/12/20 0201  CHOL 380*  TRIG 149  HDL 45  CHOLHDL 8.4  VLDL 30  LDLCALC 09/14/20*   HgbA1c:  Recent Labs  Lab 09/12/20 0202  HGBA1C >15.5*   Urine Drug Screen:  Recent Labs  Lab 09/14/20 2322  LABOPIA NONE DETECTED  COCAINSCRNUR NONE DETECTED  LABBENZ NONE DETECTED  AMPHETMU NONE DETECTED  THCU NONE DETECTED  LABBARB NONE DETECTED    Alcohol Level No results for input(s): ETH in the last 168 hours.  IMAGING past 24 hours  CT ANGIO HEAD NECK W WO CM Result Date: 09/12/2020 IMPRESSION: No large vessel occlusion or significant proximal stenosis in the head and neck.   CT Head Wo Contrast Result Date: 09/11/2020 IMPRESSION:  1. No acute intracranial hemorrhage.  2. Probable subacute infarct in the right  frontal lobe. Further evaluation with MRI is recommended. Old right parietal infarct.   MR Brain W and Wo Contrast Result Date: 09/12/2020 IMPRESSION:  1. Probable subacute hemorrhagic infarcts involving the anterior right frontal lobe and left parieto-occipital cortex as above, with additional chronic right parietal infarct.  2. Superimposed gyriform diffusion abnormality and enhancement involving the right frontotemporal region, favored to reflect changes of acute seizure. Correlation with EEG recommended. Possible ischemic change would be the primary differential consideration.  3. Abnormal symmetric T2/FLAIR signal abnormality involving the caudate and lentiform nuclei bilaterally, nonspecific, but most suggestive of superimposed/concomitant toxic metabolic derangement or other toxic insult. Correlation with history and laboratory values recommended.  4. A short interval follow-up brain MRI to ensure that these changes resolve is recommended.   EEG adult Result Date: 09/12/2020 IMPRESSION: This study is within normal limits. No seizures or epileptiform discharges were seen throughout the recording. Jason Stevenson 09/14/2020    PHYSICAL EXAM HEENT-  [place]/AT.   Lungs- Respirations unlabored Cardiovascular- regular rate and rhythm  Extremities- No edema Neuro - awake, alert, fully orientated x3.  No aphasia, fluent language, follows some commands, able to name and repeat. No gaze palsy, tracking bilaterally, visual field full, PERRL. No facial droop. Tongue midline. Bilateral UEs 5/5, no drift. Bilaterally LEs 5/5, no drift. Sensation symmetrical bilaterally, b/l FTN intact, gait not tested.   ASSESSMENT/PLAN [name] is a 35 y.o. male with history of  DM2, nonischemic cardiomyopathy, chronic systolic CHF, HLD and HTN who presents to the ED with an 8 day history of intermittent left sided twitching, left facial droop, and severe hyperglycemia.   Seizure   Intermittent left sided twitching  x1 week  MRI: Superimposed gyriform diffusion abnormality and enhancement involving the right frontotemporal region, favored to reflect changes of acute seizure.  Repeat MRI on Monday 6/6. (1 week repeat)  Spot EEG: no seizures or epileptiform dischages  LTM EEG 6/1 - seizures without clinical signs arising from right temporo-parietal region, average 4-5/hour, average duration now down to 30-40 seconds.  There is also evidence of nonspecific cortical dysfunction in left frontotemporal region.  LTM EEG 6/2 - seizures without clinical signs arising from right temporo-parietal region,average1-2/hour, average duration about 30 seconds  LTM EEG 6/3 - no more seizure since 1930 on 09/14/20  LTM EEG 6/4- 11 seizures overnigth, down to duration of 10-15 sec.  Give additional 1g keppra now, then increase Keppra 1500mg  q 12 hrs  Continue Dilantin 100mg  Q8h  Continue trileptal 600mg  bid  Dilantin level 21.0->18.4->15.4  LV thrombus Severe cardiomyopathy  Echo July 2020 showed EF 15-20%, LV diffuse hypokinesis. No significant change from  May 2017  On coreg and entresto 49-51mg  bid PTA, but not compliance  This admission 2D echo showed LV thrombus 0.9 x 0.9, EF 10 to 15%.  Heart failure team on board  Coumadin with INR goal 2-3. INR has reached goal today, 2.0. Will d/c heparin IV bridge today.  On Lasix, Coreg and spironolactone  Medication compliance again emphasized  Chronic / subacute strokes:  Probable chronic/subacute hemorrhagic infarcts involving the anterior right frontal lobe and left parieto-occipital cortex due to LV thrombus and cardiomyopathy with low EF  CT head No acute abnormality. probable subacute infarct in the right frontal lobe  CTA head & neck: no large vessel occlusion or significant proximal stenosis in the head and neck.   MRI: probable subacute hemorrhagic infarcts involving the anterior right frontal lobe and left parieto-occipital cortex  2D Echo EF  10 to 50% with LV thrombus  SARS coronavirus negative  LDL 305  HgbA1c > 15.5  UDS neg  VTE prophylaxis - scds Diet: heart healthy  No antithrombotic prior to admission, now on aspirin 81 mg daily and Coumadin.   Therapy recommendations:  none  Disposition:  pending  Hypertension  Home meds:  Coreg 9.375mg  bid  Stable  Now on Coreg, Lasix, spironolactone . Long-term BP goal normotensive  Hyperlipidemia  Home meds:  crestor 40mg  daily, not compliant   LDL 305, goal < 70  Now on Lipitor 80 mg daily   High intensity statin   Continue statin at discharge  Diabetes type II Uncontrolled/DKA  Home meds:  Glipizide 10mg  bid, levemir 30 units daily, lispro ss, metformin 500mg  bid - noncompliance  stopped checking his fingerstick glucose readings about 2 weeks ago  blood glucose was > 600 in the ED  Insulin drip -> off  Diabetes coordinator: recommendations for transition  Levemir 18 units 2 hours prior to discontinuing IV insulin  Novolog  0-15 units TID and 0-5 units QHS  Novolog 3 units TID with meals if eats at least 50%  HgbA1c > 15.5, goal < 7.0  CBGs  SSI  Other Stroke Risk Factors  Prior Marijuana use. Quit 2013. UDS negative  ETOH use, advised to drink no more than 1 drink per day  Family hx stroke (aunt)  Coronary artery disease: nonischemic cardiomyopathy  Other Active Problems  Chronic pain: takes tylenol, and ibuprofen at home at home  Neuropathy: takes gabapentin  Hospital day # 4  Desiree Metzger-Cihelka, ARNP-C, ANVP-BC Pager: [phone] 09/16/2020 11:04 AM  To contact Stroke Continuity provider, please refer to [email or web address]. After hours, contact General Neurology   Neurology Attending Attestation  I personally reviewed the chart and available images and discussed the diagnosis & management plan with the neurology APP.   I agree with the history, physical, assessment, and plan of care, with the following comments.  cEEG  showing breif non clinical seizures overnight. Will increase Keppra to 1500 BID following extra 1g IV. If continued to have seizures on EEG will consider adding vimpat.   Continue on Coumadin and ASA  Please feel free to contact me with any questions or concerns.  Sincerely, Rochele Pages, MD Neurohospitalist

## 2020-09-16 NOTE — Progress Notes (Signed)
LTM maint complete - [facility] states not able to access study at the moment.  Study restatred at 0235am.

## 2020-09-16 NOTE — Progress Notes (Signed)
ANTICOAGULATION CONSULT NOTE - Follow Up Consult  Pharmacy Consult for Warfarin and IV Heparin Indication: LV Thrombus  No Known Allergies  Patient Measurements: Height: 5\' 11"  (180.3 cm) Weight: 64.5 kg (142 lb 4.8 oz) IBW/kg (Calculated) : 75.3 Heparin Dosing Weight: 64.5 kg  Vital Signs: Temp: 97.9 F (36.6 C) (06/04 0744) Temp Source: Oral (06/04 0744) BP: 131/94 (06/04 0744) Pulse Rate: 122 (06/04 0744)  Labs: Recent Labs    09/14/20 0650 09/14/20 1545 09/14/20 2322 09/15/20 0008 09/16/20 0345  HGB 15.3  --   --  13.2 13.7  HCT 44.4  --   --  39.8 40.7  PLT 355  --   --  412* 319  LABPROT 13.1  --   --  14.6 22.2*  INR 1.0  --   --  1.1 2.0*  HEPARINUNFRC 0.13* 0.33  --  0.38 0.49  CREATININE 0.85  --  0.78 0.72 0.87    Estimated Creatinine Clearance: 106.1 mL/min (by C-G formula based on SCr of 0.87 mg/dL).   Medical History: Past Medical History:  Diagnosis Date  . Chronic systolic CHF (congestive heart failure) (HCC) 06/18/2015  . Diabetes mellitus without complication (HCC)   . Hyperlipidemia   . Hypertension   . Nonischemic cardiomyopathy (HCC) Noted as early as 2007   Per chart review (cards consult note 2011), EF of 40% in 2007, down to 20-25% in 2011    Assessment: 35 yr old man presented with acute neurologic deficits and dysarthia - found to have seizures. MRI showed probable subacute hemorrhagic infarcts involving anterior R frontal lobe and L parieto-occipital cortex. Pharmacy was consulted warfarin and heparin (pt was on no anticoagulants PTA).  ECHO showed LV thrombus. Discussed with HF team and neurology, MRI findings likely chronic in nature and okay to proceed with anticoagulation for LV thrombus.   Of note, patient is receiving phenytoin which can impact INR.    INR up significantly from 1 to 2 s/p three doses of warfarin, likely from acute altered protein binding with phenytoin use. Heparin is therapeutic, CBC stable. Five day heparin  overlap with warfarin planned - will reduce heparin drip now that warfarin is therapeutic and continue today and give lower warfarin dose. If INR remains >2 tomorrow, stop heparin.  Goal of Therapy:  INR 2-3 Heparin level 0.3-0.5 units/ml Monitor platelets by anticoagulation protocol: Yes   Plan:  Reduce heparin to 950 units/h Warfarin 2.5mg  x1 tonight Daily INR, heparin level, CBC If INR >2 again tomorrow - stop heparin  30, PharmD, BCPS, [facility] Clinical Pharmacist Please check AMION for all [facility] Pharmacy numbers 09/16/2020

## 2020-09-17 DIAGNOSIS — R29818 Other symptoms and signs involving the nervous system: Secondary | ICD-10-CM

## 2020-09-17 LAB — CBC
HCT: 40.5 % (ref 39.0–52.0)
Hemoglobin: 13.2 g/dL (ref 13.0–17.0)
MCH: 28.8 pg (ref 26.0–34.0)
MCHC: 32.6 g/dL (ref 30.0–36.0)
MCV: 88.4 fL (ref 80.0–100.0)
Platelets: 395 10*3/uL (ref 150–400)
RBC: 4.58 MIL/uL (ref 4.22–5.81)
RDW: 11.8 % (ref 11.5–15.5)
WBC: 6.9 10*3/uL (ref 4.0–10.5)
nRBC: 0 % (ref 0.0–0.2)

## 2020-09-17 LAB — GLUCOSE, CAPILLARY
Glucose-Capillary: 177 mg/dL — ABNORMAL HIGH (ref 70–99)
Glucose-Capillary: 159 mg/dL — ABNORMAL HIGH (ref 70–99)
Glucose-Capillary: 195 mg/dL — ABNORMAL HIGH (ref 70–99)
Glucose-Capillary: 95 mg/dL (ref 70–99)
Glucose-Capillary: 149 mg/dL — ABNORMAL HIGH (ref 70–99)

## 2020-09-17 LAB — BASIC METABOLIC PANEL
Anion gap: 11 (ref 5–15)
BUN: 13 mg/dL (ref 6–20)
CO2: 28 mmol/L (ref 22–32)
Calcium: 9.3 mg/dL (ref 8.9–10.3)
Chloride: 103 mmol/L (ref 98–111)
Creatinine, Ser: 1.01 mg/dL (ref 0.61–1.24)
GFR, Estimated: >60 mL/min (ref >60)
Glucose, Bld: 60 mg/dL — ABNORMAL LOW (ref 70–99)
Potassium: 4.4 mmol/L (ref 3.5–5.1)
Sodium: 142 mmol/L (ref 135–145)

## 2020-09-17 LAB — PROTIME-INR
INR: 2.2 — ABNORMAL HIGH (ref 0.8–1.2)
Prothrombin Time: 24.3 seconds — ABNORMAL HIGH (ref 11.4–15.2)

## 2020-09-17 LAB — PHENYTOIN LEVEL, TOTAL: Phenytoin Lvl: 14.4 ug/mL (ref 10.0–20.0)

## 2020-09-17 LAB — MAGNESIUM: Magnesium: 2.2 mg/dL (ref 1.7–2.4)

## 2020-09-17 MED ORDER — METOPROLOL TARTRATE 50 MG PO TABS
100.0000 mg | ORAL_TABLET | Freq: Two times a day (BID) | ORAL | Status: DC
  Administered 2020-09-17: 100 mg via ORAL
  Filled 2020-09-17: qty 2

## 2020-09-17 MED ORDER — WARFARIN SODIUM 4 MG PO TABS
4.0000 mg | ORAL_TABLET | Freq: Once | ORAL | Status: AC
  Administered 2020-09-17: 4 mg via ORAL
  Filled 2020-09-17: qty 1

## 2020-09-17 NOTE — Progress Notes (Signed)
STROKE TEAM PROGRESS NOTE   INTERVAL HISTORY Seen with wife at bedside today and answered her questions on tx plan. Pt says he slept well and feels like himself today. Wife agrees and says he is 100% at baseline now. cEEG with only 2 brief seizures. Dilantin level iwnl. INR 2.2 today.   Vitals:   09/16/20 1927 09/17/20 0024 09/17/20 0026 09/17/20 0413  BP: (!) 107/91 111/85 113/84 (!) 117/98  Pulse: (!) 118 (!) 103 (!) 110 (!) 117  Resp: 18 18 (!) 22 18  Temp: (!) 97.5 F (36.4 C) (!) 97.5 F (36.4 C) 97.7 F (36.5 C) 98 F (36.7 C)  TempSrc: Oral Oral Oral Oral  SpO2: 98% 99% 98% 100%  Weight:      Height:       CBC:  Recent Labs  Lab 09/16/20 0345 09/17/20 0157  WBC 6.5 6.9  HGB 13.7 13.2  HCT 40.7 40.5  MCV 85.0 88.4  PLT 319 395   Basic Metabolic Panel:  Recent Labs  Lab 09/13/20 0255 09/14/20 0650 09/16/20 0345 09/17/20 0157  NA 132*   < > 136 142  K 4.0   < > 3.7 4.4  CL 94*   < > 100 103  CO2 25   < > 26 28  GLUCOSE 186*   < > 81 60*  BUN 15   < > 11 13  CREATININE 0.87   < > 0.87 1.01  CALCIUM 9.3   < > 9.0 9.3  MG 1.8   < > 1.9 2.2  PHOS 4.3  --   --   --    < > = values in this interval not displayed.   Lipid Panel:  Recent Labs  Lab 09/12/20 0201  CHOL 380*  TRIG 149  HDL 45  CHOLHDL 8.4  VLDL 30  LDLCALC 182*   HgbA1c:  Recent Labs  Lab 09/12/20 0202  HGBA1C >15.5*   Urine Drug Screen:  Recent Labs  Lab 09/14/20 2322  LABOPIA NONE DETECTED  COCAINSCRNUR NONE DETECTED  LABBENZ NONE DETECTED  AMPHETMU NONE DETECTED  THCU NONE DETECTED  LABBARB NONE DETECTED    Alcohol Level No results for input(s): ETH in the last 168 hours.  IMAGING past 24 hours  CT ANGIO HEAD NECK W WO CM Result Date: 09/12/2020 IMPRESSION: No large vessel occlusion or significant proximal stenosis in the head and neck.   CT Head Wo Contrast Result Date: 09/11/2020 IMPRESSION:  1. No acute intracranial hemorrhage.  2. Probable subacute infarct in the  right frontal lobe. Further evaluation with MRI is recommended. Old right parietal infarct.   MR Brain W and Wo Contrast Result Date: 09/12/2020 IMPRESSION:  1. Probable subacute hemorrhagic infarcts involving the anterior right frontal lobe and left parieto-occipital cortex as above, with additional chronic right parietal infarct.  2. Superimposed gyriform diffusion abnormality and enhancement involving the right frontotemporal region, favored to reflect changes of acute seizure. Correlation with EEG recommended. Possible ischemic change would be the primary differential consideration.  3. Abnormal symmetric T2/FLAIR signal abnormality involving the caudate and lentiform nuclei bilaterally, nonspecific, but most suggestive of superimposed/concomitant toxic metabolic derangement or other toxic insult. Correlation with history and laboratory values recommended.  4. A short interval follow-up brain MRI to ensure that these changes resolve is recommended.   PHYSICAL EXAM HEENT-  [place]/AT.   Lungs- Respirations unlabored Cardiovascular- regular rate and rhythm Extremities- No edema Neuro - awake, alert, fully orientated x3.  No aphasia, fluent language,  follows some commands, able to name and repeat. No gaze palsy, tracking bilaterally, visual field full, PERRL. No facial droop. Tongue midline. Bilateral UEs 5/5, no drift. Bilaterally LEs 5/5, no drift. Sensation symmetrical bilaterally, b/l FTN intact, gait not tested.   ASSESSMENT/PLAN Mr. Curren Mohrmann is a 35 y.o. male with history of  DM2, nonischemic cardiomyopathy, chronic systolic CHF, HLD and HTN who presents to the ED with an 8 day history of intermittent left sided twitching, left facial droop, and severe hyperglycemia.   Seizure   Intermittent left sided twitching x1 week  MRI: Superimposed gyriform diffusion abnormality and enhancement involving the right frontotemporal region, favored to reflect changes of acute seizure.  Repeat  MRI on Monday 6/6. (1 week repeat)  Spot EEG: no seizures or epileptiform dischages  LTM EEG 6/1 - seizures without clinical signs arising from right temporo-parietal region, average 4-5/hour, average duration now down to 30-40 seconds.  There is also evidence of nonspecific cortical dysfunction in left frontotemporal region.  LTM EEG 6/2 - seizures without clinical signs arising from right temporo-parietal region,average1-2/hour, average duration about 30 seconds  LTM EEG 6/3 - no more seizure since 1930 on 09/14/20  LTM EEG 6/4- 11 seizures overnigth, down to duration of 10-15 sec.  LTM EEG 6/5: twoseizures without clinical signs arising from right temporo-parietal region, average duration about 12-15seconds,last seizure on 09/17/2020 at0547.Thereare also brief ictal-interictal rhythmic discharges( BIRDS) inright temporo-parietal regionwhichis on the ictal-interictal continuum with high potential for seizures.  Keppra 1500mg  Q12  Continue Dilantin 100mg  Q8h  Continue trileptal 600mg  bid  Dilantin level 21.0->18.4->15.4->14.4  LV thrombus Severe cardiomyopathy  Echo July 2020 showed EF 15-20%, LV diffuse hypokinesis. No significant change from  May 2017  On coreg and entresto 49-51mg  bid PTA, but not compliance  This admission 2D echo showed LV thrombus 0.9 x 0.9, EF 10 to 15%.  Heart failure team on board  He was bridged with heparin ggt to goal INR, this was dc on 6/4  Coumadin with INR goal 2-3. INR is 2.2 today  On Lasix, Coreg and spironolactone  Medication compliance again emphasized  Chronic / subacute strokes:  Probable chronic/subacute hemorrhagic infarcts involving the anterior right frontal lobe and left parieto-occipital cortex due to LV thrombus and cardiomyopathy with low EF  CT head No acute abnormality. probable subacute infarct in the right frontal lobe  CTA head & neck: no large vessel occlusion or significant proximal stenosis in the head and  neck.   MRI: probable subacute hemorrhagic infarcts involving the anterior right frontal lobe and left parieto-occipital cortex  2D Echo EF 10 to 50% with LV thrombus  SARS coronavirus negative  LDL 305  HgbA1c > 15.5  UDS neg  VTE prophylaxis - scds Diet: heart healthy  No antithrombotic prior to admission, now on aspirin 81 mg daily and Coumadin.   Therapy recommendations:  none  Disposition:  pending  Hypertension  Home meds:  Coreg 9.375mg  bid  Stable  Now on Coreg, Lasix, spironolactone . Long-term BP goal normotensive  Hyperlipidemia  Home meds:  crestor 40mg  daily, not compliant   LDL 305, goal < 70  Now on Lipitor 80 mg daily   High intensity statin   Continue statin at discharge  Diabetes type II Uncontrolled/DKA  Home meds:  Glipizide 10mg  bid, levemir 30 units daily, lispro ss, metformin 500mg  bid - noncompliance  stopped checking his fingerstick glucose readings about 2 weeks ago  blood glucose was > 600 in the ED  Insulin  drip -> off  Diabetes coordinator: recommendations for transition  Levemir 18 units 2 hours prior to discontinuing IV insulin  Novolog 0-15 units TID and 0-5 units QHS  Novolog 3 units TID with meals if eats at least 50%  HgbA1c > 15.5, goal < 7.0  CBGs  SSI  Other Stroke Risk Factors  Prior Marijuana use. Quit 2013. UDS negative  ETOH use, advised to drink no more than 1 drink per day  Family hx stroke (aunt)  Coronary artery disease: nonischemic cardiomyopathy  Other Active Problems  Chronic pain: takes tylenol, and ibuprofen at home at home  Neuropathy: takes gabapentin  Hospital day # 5  Jerome Viglione Metzger-Cihelka, ARNP-C, ANVP-BC Pager: [phone] 09/17/2020 11:28 AM  To contact Stroke Continuity provider, please refer to [email or web address]. After hours, contact General Neurology

## 2020-09-17 NOTE — Plan of Care (Signed)
  Problem: Clinical Measurements: Goal: Ability to maintain clinical measurements within normal limits will improve Outcome: Progressing Goal: Will remain free from infection Outcome: Progressing Goal: Diagnostic test results will improve Outcome: Progressing Goal: Respiratory complications will improve Outcome: Progressing Goal: Cardiovascular complication will be avoided Outcome: Progressing   Problem: Safety: Goal: Ability to remain free from injury will improve Outcome: Progressing   Problem: Ischemic Stroke/TIA Tissue Perfusion: Goal: Complications of ischemic stroke/TIA will be minimized Outcome: Progressing   

## 2020-09-17 NOTE — Progress Notes (Signed)
Progress Note  Patient Name: Jason Stevenson Date of Encounter: 09/17/2020  [facility] Cardiologist: Charlton Haws, MD   Subjective   Feeling well.  Notes that his heart rate is a little better.   Inpatient Medications    Scheduled Meds: .  stroke: mapping our early stages of recovery book   Does not apply Once  . aspirin EC  81 mg Oral Daily  . atorvastatin  80 mg Oral Daily  . ezetimibe  10 mg Oral Daily  . furosemide  20 mg Oral Daily  . insulin aspart  0-15 Units Subcutaneous TID WC  . insulin aspart  0-5 Units Subcutaneous QHS  . insulin aspart  6 Units Subcutaneous TID WC  . insulin detemir  25 Units Subcutaneous BID  . ivabradine  5 mg Oral BID WC  . levETIRAcetam  1,500 mg Oral BID  . living well with diabetes book   Does not apply Once  . losartan  12.5 mg Oral Daily  . metoprolol tartrate  50 mg Oral Q8H  . OXcarbazepine  600 mg Oral BID  . phenytoin  100 mg Oral TID  . spironolactone  12.5 mg Oral Daily  . warfarin  4 mg Oral ONCE-1600  . Warfarin - Pharmacist Dosing Inpatient   Does not apply q1600   Continuous Infusions:  PRN Meds: acetaminophen **OR** acetaminophen (TYLENOL) oral liquid 160 mg/5 mL **OR** acetaminophen, dextrose, ondansetron (ZOFRAN) IV   Vital Signs    Vitals:   09/16/20 1927 09/17/20 0024 09/17/20 0026 09/17/20 0413  BP: (!) 107/91 111/85 113/84 (!) 117/98  Pulse: (!) 118 (!) 103 (!) 110 (!) 117  Resp: 18 18 (!) 22 18  Temp: (!) 97.5 F (36.4 C) (!) 97.5 F (36.4 C) 97.7 F (36.5 C) 98 F (36.7 C)  TempSrc: Oral Oral Oral Oral  SpO2: 98% 99% 98% 100%  Weight:      Height:        Intake/Output Summary (Last 24 hours) at 09/17/2020 1124 Last data filed at 09/17/2020 0821 Gross per 24 hour  Intake 340 ml  Output 650 ml  Net -310 ml   Last 3 Weights 09/13/2020 12/02/2019 08/06/2019  Weight (lbs) 142 lb 4.8 oz 167 lb 167 lb  Weight (kg) 64.547 kg 75.751 kg 75.751 kg      Telemetry    Sinus tachycardia.  Rate in the  110-120s.- Personally Reviewed  ECG    n/a - Personally Reviewed  Physical Exam   VS:  BP (!) 117/98 (BP Location: Right Arm)   Pulse (!) 117   Temp 98 F (36.7 C) (Oral)   Resp 18   Ht 5\' 11"  (1.803 m)   Wt 64.5 kg   SpO2 100%   BMI 19.85 kg/m  , BMI Body mass index is 19.85 kg/m. GENERAL:  Well appearing HEENT: Pupils equal round and reactive, fundi not visualized, oral mucosa unremarkable NECK:  No jugular venous distention, waveform within normal limits, carotid upstroke brisk and symmetric, no bruits LUNGS:  Clear to auscultation bilaterally HEART:  Tachycardic.  Regular rhythm. PMI not displaced or sustained,S1 and S2 within normal limits, no S3, no S4, no clicks, no rubs, no murmurs ABD:  Flat, positive bowel sounds normal in frequency in pitch, no bruits, no rebound, no guarding, no midline pulsatile mass, no hepatomegaly, no splenomegaly EXT:  2 plus pulses throughout, no edema, no cyanosis no clubbing SKIN:  No rashes no nodules NEURO:  Cranial nerves II through XII grossly intact,  motor grossly intact throughout.  Dysarthria PSYCH:  Cognitively intact, oriented to person place and time   Labs    High Sensitivity Troponin:  No results for input(s): TROPONINIHS in the last 720 hours.    Chemistry Recent Labs  Lab 09/11/20 2019 09/11/20 2037 09/13/20 0255 09/14/20 0650 09/15/20 0008 09/16/20 0345 09/17/20 0157  NA 127*   < > 132*   < > 134* 136 142  K 4.8   < > 4.0   < > 3.8 3.7 4.4  CL 88*   < > 94*   < > 97* 100 103  CO2 26   < > 25   < > 24 26 28   GLUCOSE 797*   < > 186*   < > 106* 81 60*  BUN 15   < > 15   < > 14 11 13   CREATININE 1.18   < > 0.87   < > 0.72 0.87 1.01  CALCIUM 9.6   < > 9.3   < > 8.8* 9.0 9.3  PROT 8.3*  --   --   --   --   --   --   ALBUMIN 3.8  --  3.1*  --   --   --   --   AST 19  --   --   --   --   --   --   ALT 17  --   --   --   --   --   --   ALKPHOS 70  --   --   --   --   --   --   BILITOT 0.7  --   --   --   --   --   --    GFRNONAA >60   < > >60   < > >60 >60 >60  ANIONGAP 13   < > 13   < > 13 10 11    < > = values in this interval not displayed.     Hematology Recent Labs  Lab 09/15/20 0008 09/16/20 0345 09/17/20 0157  WBC 5.8 6.5 6.9  RBC 4.68 4.79 4.58  HGB 13.2 13.7 13.2  HCT 39.8 40.7 40.5  MCV 85.0 85.0 88.4  MCH 28.2 28.6 28.8  MCHC 33.2 33.7 32.6  RDW 11.7 11.7 11.8  PLT 412* 319 395    BNPNo results for input(s): BNP, PROBNP in the last 168 hours.   DDimer No results for input(s): DDIMER in the last 168 hours.   Radiology    No results found.  Cardiac Studies   Echo 09/13/2020: 1. 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  2. Left ventricular ejection fraction, by estimation, is 10-15%. The left  ventricle has severely decreased function. The left ventricle demonstrates  global hypokinesis. The left ventricular internal cavity size was mildly  dilated. Left ventricular  diastolic parameters are indeterminate.  3. Right ventricular systolic function is moderately reduced. The right  ventricular size is normal. Tricuspid regurgitation signal is inadequate  for assessing PA pressure.  4. Left atrial size was mildly dilated.  5. The mitral valve is grossly normal. Mild mitral valve regurgitation.  No evidence of mitral stenosis.  6. The aortic valve is tricuspid. Aortic valve regurgitation is not  visualized. No aortic stenosis is present.  7. The inferior vena cava is dilated in size with <50% respiratory  variability, suggesting right atrial pressure of 15 mmHg.   Patient Profile     35  y.o. male with chronic systolic diastolic heart failure secondary to nonischemic cardiomyopathy, prior stroke, diabetes, and hyperlipidemia admitted with seizures.  Cardiology following for persistent tachycardia.  Assessment & Plan    #Chronic systolic and diastolic heart failure: LVEF 10 to 15%.  He appears euvolemic though IVC was dilated on echo this admission.  Right  atrial pressure was 15 mmHg.  Respiratory status is stable.on 6/4 we switched carvedilol to metoprolol for improved rate control.  His heart rate is slightly better but still tachycardic.  We will increase 200 mg twice daily today.  If he tolerates this, we can plan to consolidate to succinate tomorrow given his systolic dysfunction.  Continue losartan and spironolactone.  Continue oral Lasix.  He was started on ivabradine.   He is not on an SGLT2 inhibitor because his A1c is too high.  Reassess as an outpatient.  #LV thrombus: Present on echo this admission.  IN 2.2 today on warfarin.   #CVA: Likely ischemic in the setting of LV thrombus.  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  He had more seizures this am.  # Hyperlipidemia:  Likely FH given that his LDL is 305.  Recommend that he be seen by the lipid clinic as an outpatient.  Continue atorvastatin and Zetia for now.       For questions or updates, please contact [facility] Please consult [email or web address] for contact info under        Signed, Chilton Si, MD  09/17/2020, 11:24 AM

## 2020-09-17 NOTE — Progress Notes (Signed)
vLTM EEG complete. No skin breakdown at FP1 FP2 pZ A1 continue to monitor

## 2020-09-17 NOTE — Procedures (Addendum)
Patient Name:Jason Stevenson DPB:[id number] Epilepsy Attending:Usha Slager Annabelle Harman Referring Physician/Provider:Dr Eligha Bridegroom Duration:09/16/2020 1901 to 6/5/20221901  Patient history:35 year old male presenting with new onset of left sided twitching, left facial droop and severe hyperglycemia.EEG to evaluate for seizure.  Level of alertness:Awake,asleep  AEDs during EEG study:LEV, PHT, OXC  Technical aspects: This EEG study was done with scalp electrodes positioned according to the 10-20 International system of electrode placement. Electrical activity was acquired at a sampling rate of 500Hz  and reviewed with a high frequency filter of 70Hz  and a low frequency filter of 1Hz . EEG data were recorded continuously and digitally stored.   Description: The posterior dominant rhythm consists of8-9Hz  activity of moderate voltage (25-35 uV) seen predominantly in posterior head regions, symmetric and reactive to eye opening and eye closing.Sleep was characterized by orbicularis, sleep spindles (12 to 14 Hz), maximum frontocentral region.Two seizures without clinical signs were seen arising from right temporo-parietal region, average duration about 12-15 seconds, last seizure on 09/16/2020 [id number]. Brief ictal-interictal rhythmic discharges were also noted in right temporo-parietal region  ABNORMALITY -Seizure without clinical signs, right temporo-parietal region - Brief ictal-interictal rhythmic discharges,right temporo-parietal region ( BIRDS)  IMPRESSION: This studyshowedtwo seizures without clinical signs arising from right temporo-parietal region, average duration about 12-15 seconds, last seizure on 09/17/2020 at0547. There are also brief ictal-interictal rhythmic discharges ( BIRDS) in right temporo-parietal region which is on the ictal-interictal continuum with high potential for seizures.   Javares Kaufhold 11/16/2020

## 2020-09-17 NOTE — Progress Notes (Signed)
PROGRESS NOTE    Jason Stevenson  GUY:[id number] DOB: 09-Nov-1985 DOA: 09/11/2020 PCP: Marcine Matar, MD   Brief Narrative:  [name] with PMH of systolic CHF, IDDM-2, HTN, HLD, COVID-19 infection in 07/2020 and noncompliance with medication presenting with tingling in left arm, left face, drooling and blank stare for about 1 week, and admitted for possible seizure, HHS and possible CVA.  In ED, he had left-sided neurologic deficit with dysarthria.  Blood glucose elevated to 797 but not acidotic.  Serum osmolality 323.  Lactic acid 2.2.  CT head with probable subacute infarct in right frontal lobe and old right parietal infarct.  Reportedly, patient's neurologic deficits resolved in ED, and reconsult shifted to possible seizure versus stroke.  He was loaded with Keppra and started on insulin drip.   MRI brain with probable hemorrhagic infarct involving the anterior right frontal lobe and left parietal occipital cortex with additional chronic right parietal infarct, right frontotemporal region enhancement suggesting changes related to acute seizure, possible toxic insult involving the caudate and lentiform nuclei bilaterally.  Cardiology recommended interval follow-up brain MRI.  CTA head and neck without significant finding.  Patient had intermittent seizure activities confirmed on continuous EEG.  Keppra increased to 1000 mg twice daily.  Dilantin and Trileptal added.  Neurology following.  TTE with EF of 10-15%, LV thrombus, moderate RV dysfunction and dilated IVC.  Cardiology following.  Assessment & Plan:   Principal Problem:   Acute cerebrovascular accident (CVA) (HCC) Active Problems:   HTN (hypertension)   HLD (hyperlipidemia)   Type 2 diabetes mellitus (HCC)   Hyperosmolar hyperglycemic state (HHS) (HCC)   Neurological deficit present   Intracerebral hemorrhage   LV (left ventricular) mural thrombus   Seizure (HCC)   Chronic systolic CHF/LV thrombus: TTE with EF of  10-15%, LV thrombus, moderate RV dysfunction and dilated IVC. He has not had subsequent TTE or follow-up.  Supposed to be on Coreg and Entresto but not compliant. -Advanced HF/cardiology managing -GDMT-metoprolol, losartan, Aldactone, Lasix and Corlanor -A1c too high for SGLT2 inhibitors. -Continue statin and Zetia.  Needs follow-up at lipid clinic -Monitor fluid status, renal functions and electrolytes  LV thrombus: INR therapeutic.  Was bridged with Lovenox which has been discontinued.  Continue Coumadin, pharmacy dosed.  Sinus tachycardia/uncontrolled BP: Heart rate still elevated but better than yesterday.  Asymptomatic. -Cardiology changed Coreg to metoprolol for better rate control.  Increasing 200 mg twice daily today.  Subacute hemorrhagic CVA-presented with left arm and face tingling.  Noted to have some dysarthria.  Had some left facial droop and residual motor and sensory deficit on the left. CT and MRI with probable subacute hemorrhagic infarct in right frontal and left parietal occipital areas. CTA head and neck basically negative.  He is noncompliant.  A lot of risk factors.  LDL is 305.  TTE with severely reduced LVEF and LV thrombus -Neurology managing-on low-dose aspirin and high intensity statin -Needs follow-up at lipid clinic. -Appreciate PT/OT/SLP eval-no need identified.  He has very minimal deficit on the left lower extremity but no other deficit  Seizures: Intermittent seizure activity confirmed on continuous EEG.  Likely due to CVA.  -Neurology managing-on Keppra, Dilantin and Trileptal, dose adjusting based on continuous EEG. -Seizure and fall precautions -Continue PT/OT/SLP-no need identified.  Uncontrolled IDDM-2 with HHS and hyperlipidemia: A1c> 15.5%.  Blood sugar fairly controlled. -Continue Lantus 25 units twice daily -Continue NovoLog 6 units 3 times daily with meals -Continue SSI/moderate -High intensity statin as above  Noncompliance -  Counseled on  this multiple times  History of COVID-19 infection -COVID-19 PCR negative now   DVT prophylaxis: coumadin    Code Status: Full Code  Family Communication: None present at bedside.  Plan of care discussed with patient in length and he verbalized understanding and agreed with it.  Status is: Inpatient  Remains inpatient appropriate because:Inpatient level of care appropriate due to severity of illness   Dispo: The patient is from: Home              Anticipated d/c is to: Home              Patient currently is not medically stable to d/c.   Difficult to place patient No        Estimated body mass index is 19.85 kg/m as calculated from the following:   Height as of this encounter: 5\' 11"  (1.803 m).   Weight as of this encounter: 64.5 kg.      Nutritional status:               Consultants:   Neurology  Cardiology  Procedures:   None  Antimicrobials:  Anti-infectives (From admission, onward)   None         Subjective: Seen and examined.  He has no complaints.  Objective: Vitals:   09/17/20 0024 09/17/20 0026 09/17/20 0413 09/17/20 1147  BP: 111/85 113/84 (!) 117/98 (!) 111/92  Pulse: (!) 103 (!) 110 (!) 117   Resp: 18 (!) 22 18 19   Temp: (!) 97.5 F (36.4 C) 97.7 F (36.5 C) 98 F (36.7 C) 97.7 F (36.5 C)  TempSrc: Oral Oral Oral Oral  SpO2: 99% 98% 100% 100%  Weight:      Height:        Intake/Output Summary (Last 24 hours) at 09/17/2020 1241 Last data filed at 09/17/2020 11/17/2020 Gross per 24 hour  Intake 340 ml  Output 650 ml  Net -310 ml   Filed Weights   09/13/20 1100  Weight: 64.5 kg    Examination:  General exam: Appears calm and comfortable  Respiratory system: Clear to auscultation. Respiratory effort normal. Cardiovascular system: S1 & S2 heard, RRR. No JVD, murmurs, rubs, gallops or clicks. No pedal edema. Gastrointestinal system: Abdomen is nondistended, soft and nontender. No organomegaly or masses felt. Normal  bowel sounds heard. Central nervous system: Alert and oriented. No focal neurological deficits except very minimal weakness in the left lower extremity compared to the right lower extremity Extremities: Symmetric 5 x 5 power. Skin: No rashes, lesions or ulcers Psychiatry: Judgement and insight appear normal. Mood & affect appropriate.    Data Reviewed: I have personally reviewed following labs and imaging studies  CBC: Recent Labs  Lab 09/11/20 2019 09/11/20 2037 09/14/20 0650 09/15/20 0008 09/16/20 0345 09/17/20 0157  WBC 5.2  --  5.7 5.8 6.5 6.9  HGB 14.5 15.0 15.3 13.2 13.7 13.2  HCT 42.0 44.0 44.4 39.8 40.7 40.5  MCV 83.0  --  83.6 85.0 85.0 88.4  PLT 411*  --  355 412* 319 395   Basic Metabolic Panel: Recent Labs  Lab 09/13/20 0255 09/14/20 0650 09/14/20 2322 09/15/20 0008 09/16/20 0345 09/17/20 0157  NA 132* 136 135 134* 136 142  K 4.0 3.9 3.7 3.8 3.7 4.4  CL 94* 98 100 97* 100 103  CO2 25 28 27 24 26 28   GLUCOSE 186* 137* 115* 106* 81 60*  BUN 15 14 14 14 11 13   CREATININE 0.87 0.85 0.78  0.72 0.87 1.01  CALCIUM 9.3 9.4 8.8* 8.8* 9.0 9.3  MG 1.8  --  2.0 1.9 1.9 2.2  PHOS 4.3  --   --   --   --   --    GFR: Estimated Creatinine Clearance: 91.4 mL/min (by C-G formula based on SCr of 1.01 mg/dL). Liver Function Tests: Recent Labs  Lab 09/11/20 2019 09/13/20 0255  AST 19  --   ALT 17  --   ALKPHOS 70  --   BILITOT 0.7  --   PROT 8.3*  --   ALBUMIN 3.8 3.1*   No results for input(s): LIPASE, AMYLASE in the last 168 hours. No results for input(s): AMMONIA in the last 168 hours. Coagulation Profile: Recent Labs  Lab 09/14/20 0650 09/15/20 0008 09/16/20 0345 09/16/20 1049 09/17/20 0157  INR 1.0 1.1 2.0* 2.3* 2.2*   Cardiac Enzymes: Recent Labs  Lab 09/11/20 2039  CKTOTAL 67   BNP (last 3 results) No results for input(s): PROBNP in the last 8760 hours. HbA1C: No results for input(s): HGBA1C in the last 72 hours. CBG: Recent Labs  Lab  09/16/20 1925 09/16/20 2114 09/16/20 2129 09/17/20 0622 09/17/20 1221  GLUCAP 239* 177* 151* 159* 195*   Lipid Profile: No results for input(s): CHOL, HDL, LDLCALC, TRIG, CHOLHDL, LDLDIRECT in the last 72 hours. Thyroid Function Tests: Recent Labs    09/14/20 2322  TSH 1.009   Anemia Panel: No results for input(s): VITAMINB12, FOLATE, FERRITIN, TIBC, IRON, RETICCTPCT in the last 72 hours. Sepsis Labs: Recent Labs  Lab 09/11/20 2039  LATICACIDVEN 2.2*    Recent Results (from the past 240 hour(s))  SARS CORONAVIRUS 2 (TAT 6-24 HRS) Nasopharyngeal Nasopharyngeal Swab     Status: None   Collection Time: 09/12/20 12:57 AM   Specimen: Nasopharyngeal Swab  Result Value Ref Range Status   SARS Coronavirus 2 NEGATIVE NEGATIVE Final    Comment: (NOTE) SARS-CoV-2 target nucleic acids are NOT DETECTED.  The SARS-CoV-2 RNA is generally detectable in upper and lower respiratory specimens during the acute phase of infection. Negative results do not preclude SARS-CoV-2 infection, do not rule out co-infections with other pathogens, and should not be used as the sole basis for treatment or other patient management decisions. Negative results must be combined with clinical observations, patient history, and epidemiological information. The expected result is Negative.  Fact Sheet for Patients: HairSlick.no  Fact Sheet for Healthcare Providers: quierodirigir.com  This test is not yet approved or cleared by the [place] FDA and  has been authorized for detection and/or diagnosis of SARS-CoV-2 by FDA under an Emergency Use Authorization (EUA). This EUA will remain  in effect (meaning this test can be used) for the duration of the COVID-19 declaration under Se ction 564(b)(1) of the Act, 21 U.S.C. section 360bbb-3(b)(1), unless the authorization is terminated or revoked sooner.  Performed at [facility], [address], [place], [place] [address]       Radiology Studies: No results found.  Scheduled Meds: .  stroke: mapping our early stages of recovery book   Does not apply Once  . aspirin EC  81 mg Oral Daily  . atorvastatin  80 mg Oral Daily  . ezetimibe  10 mg Oral Daily  . furosemide  20 mg Oral Daily  . insulin aspart  0-15 Units Subcutaneous TID WC  . insulin aspart  0-5 Units Subcutaneous QHS  . insulin aspart  6 Units Subcutaneous TID WC  . insulin detemir  25 Units Subcutaneous BID  . ivabradine  5 mg Oral BID WC  . levETIRAcetam  1,500 mg Oral BID  . living well with diabetes book   Does not apply Once  . losartan  12.5 mg Oral Daily  . metoprolol tartrate  100 mg Oral BID  . OXcarbazepine  600 mg Oral BID  . phenytoin  100 mg Oral TID  . spironolactone  12.5 mg Oral Daily  . warfarin  4 mg Oral ONCE-1600  . Warfarin - Pharmacist Dosing Inpatient   Does not apply q1600   Continuous Infusions:   LOS: 5 days   Time spent: 35 minutes   Hughie Closs, MD [facility]  09/17/2020, 12:41 PM   How to contact the [facility] Attending or Consulting provider 7A - 7P or covering provider during after hours 7P -7A, for this patient?  1. Check the care team in [facility] and look for a) attending/consulting [facility] provider listed and b) the [facility] team listed. Page or secure chat 7A-7P. 2. Log into [email or web address] and use [place]'s universal password to access. If you do not have the password, please contact the hospital operator. 3. Locate the [facility] provider you are looking for under [facility] and page to a number that you can be directly reached. 4. If you still have difficulty reaching the provider, please page the [facility] (Director on Call) for the Hospitalists listed on amion for assistance.

## 2020-09-17 NOTE — Progress Notes (Signed)
ANTICOAGULATION CONSULT NOTE - Follow Up Consult  Pharmacy Consult for Warfarin  Indication: LV Thrombus  No Known Allergies  Patient Measurements: Height: 5\' 11"  (180.3 cm) Weight: 64.5 kg (142 lb 4.8 oz) IBW/kg (Calculated) : 75.3 Heparin Dosing Weight: 64.5 kg  Vital Signs: Temp: 98 F (36.7 C) (06/05 0413) Temp Source: Oral (06/05 0413) BP: 117/98 (06/05 0413) Pulse Rate: 117 (06/05 0413)  Labs: Recent Labs    09/14/20 1545 09/14/20 2322 09/15/20 0008 09/16/20 0345 09/16/20 1049 09/17/20 0157  HGB  --    < > 13.2 13.7  --  13.2  HCT  --   --  39.8 40.7  --  40.5  PLT  --   --  412* 319  --  395  LABPROT  --    < > 14.6 22.2* 25.3* 24.3*  INR  --    < > 1.1 2.0* 2.3* 2.2*  HEPARINUNFRC 0.33  --  0.38 0.49  --   --   CREATININE  --    < > 0.72 0.87  --  1.01   < > = values in this interval not displayed.    Estimated Creatinine Clearance: 91.4 mL/min (by C-G formula based on SCr of 1.01 mg/dL).   Medical History: Past Medical History:  Diagnosis Date  . Chronic systolic CHF (congestive heart failure) (HCC) 06/18/2015  . Diabetes mellitus without complication (HCC)   . Hyperlipidemia   . Hypertension   . Nonischemic cardiomyopathy (HCC) Noted as early as 2007   Per chart review (cards consult note 2011), EF of 40% in 2007, down to 20-25% in 2011    Assessment: 35 yr old man presented with acute neurologic deficits and dysarthia - found to have seizures. MRI showed probable subacute hemorrhagic infarcts involving anterior R frontal lobe and L parieto-occipital cortex. Pharmacy was consulted warfarin and heparin (pt was on no anticoagulants PTA).  ECHO showed LV thrombus. Discussed with HF team and neurology, MRI findings likely chronic in nature and okay to proceed with anticoagulation for LV thrombus.   Of note, patient is receiving phenytoin which can impact INR.    INR up significantly from 1 to 2 s/p three doses of warfarin, likely from acute altered  protein binding with phenytoin use. Heparin is therapeutic, CBC stable. Five day heparin overlap with warfarin planned, however on 6/4 Neuro wanted to stop (day #4), appropriate given rapid INR jump and subacute hemorrhagic CVA>heparin dc'd 6/4.   09/17/20:  INR = 2.2 remains therapeutic, appears to be stabilizing compared to significant rise in INR on 6/4.   CBC within normal/ stable.  No acute bleeding noted.  Goal of Therapy:  INR 2-3 Heparin level 0.3-0.5 units/ml Monitor platelets by anticoagulation protocol: Yes   Plan:  Warfarin 4 mg x1 tonight Daily INR, heparin level, CBC  8/4, RPh Clinical Pharmacist [phone] Please check AMION for all [facility] Pharmacy numbers 09/17/2020

## 2020-09-18 ENCOUNTER — Inpatient Hospital Stay (HOSPITAL_COMMUNITY): Payer: Medicaid Other

## 2020-09-18 LAB — CBC
HCT: 38.7 % — ABNORMAL LOW (ref 39.0–52.0)
Hemoglobin: 13 g/dL (ref 13.0–17.0)
MCH: 28.8 pg (ref 26.0–34.0)
MCHC: 33.6 g/dL (ref 30.0–36.0)
MCV: 85.6 fL (ref 80.0–100.0)
Platelets: 313 10*3/uL (ref 150–400)
RBC: 4.52 MIL/uL (ref 4.22–5.81)
RDW: 11.9 % (ref 11.5–15.5)
WBC: 7.3 10*3/uL (ref 4.0–10.5)
nRBC: 0 % (ref 0.0–0.2)

## 2020-09-18 LAB — PROTIME-INR
INR: 2.8 — ABNORMAL HIGH (ref 0.8–1.2)
Prothrombin Time: 29.2 seconds — ABNORMAL HIGH (ref 11.4–15.2)

## 2020-09-18 LAB — PHENYTOIN LEVEL, TOTAL: Phenytoin Lvl: 12.6 ug/mL (ref 10.0–20.0)

## 2020-09-18 LAB — GLUCOSE, CAPILLARY
Glucose-Capillary: 59 mg/dL — ABNORMAL LOW (ref 70–99)
Glucose-Capillary: 82 mg/dL (ref 70–99)
Glucose-Capillary: 128 mg/dL — ABNORMAL HIGH (ref 70–99)
Glucose-Capillary: 161 mg/dL — ABNORMAL HIGH (ref 70–99)
Glucose-Capillary: 131 mg/dL — ABNORMAL HIGH (ref 70–99)

## 2020-09-18 LAB — BASIC METABOLIC PANEL
Anion gap: 9 (ref 5–15)
BUN: 10 mg/dL (ref 6–20)
CO2: 27 mmol/L (ref 22–32)
Calcium: 8.7 mg/dL — ABNORMAL LOW (ref 8.9–10.3)
Chloride: 103 mmol/L (ref 98–111)
Creatinine, Ser: 0.88 mg/dL (ref 0.61–1.24)
GFR, Estimated: >60 mL/min (ref >60)
Glucose, Bld: 50 mg/dL — ABNORMAL LOW (ref 70–99)
Potassium: 3.7 mmol/L (ref 3.5–5.1)
Sodium: 139 mmol/L (ref 135–145)

## 2020-09-18 LAB — MAGNESIUM: Magnesium: 1.8 mg/dL (ref 1.7–2.4)

## 2020-09-18 MED ORDER — IVABRADINE HCL 7.5 MG PO TABS
7.5000 mg | ORAL_TABLET | Freq: Two times a day (BID) | ORAL | Status: DC
  Administered 2020-09-18 – 2020-09-19 (×3): 7.5 mg via ORAL
  Filled 2020-09-18 (×4): qty 1

## 2020-09-18 MED ORDER — INSULIN DETEMIR 100 UNIT/ML ~~LOC~~ SOLN
20.0000 [IU] | Freq: Two times a day (BID) | SUBCUTANEOUS | Status: DC
  Administered 2020-09-18 – 2020-09-19 (×3): 20 [IU] via SUBCUTANEOUS
  Filled 2020-09-18 (×4): qty 0.2

## 2020-09-18 MED ORDER — METOPROLOL SUCCINATE ER 100 MG PO TB24
200.0000 mg | ORAL_TABLET | Freq: Every day | ORAL | Status: DC
  Administered 2020-09-18 – 2020-09-19 (×2): 200 mg via ORAL
  Filled 2020-09-18 (×2): qty 2

## 2020-09-18 MED ORDER — DIGOXIN 125 MCG PO TABS
0.1250 mg | ORAL_TABLET | Freq: Every day | ORAL | Status: DC
  Administered 2020-09-18 – 2020-09-19 (×2): 0.125 mg via ORAL
  Filled 2020-09-18 (×2): qty 1

## 2020-09-18 MED ORDER — WARFARIN SODIUM 2 MG PO TABS
2.0000 mg | ORAL_TABLET | Freq: Once | ORAL | Status: AC
  Administered 2020-09-18: 2 mg via ORAL
  Filled 2020-09-18: qty 1

## 2020-09-18 NOTE — Progress Notes (Signed)
PROGRESS NOTE    Jason Stevenson  RCB:[id number] DOB: 1985-05-15 DOA: 09/11/2020 PCP: Marcine Matar, MD   Brief Narrative:  [name] with PMH of systolic CHF, IDDM-2, HTN, HLD, COVID-19 infection in 07/2020 and noncompliance with medication presenting with tingling in left arm, left face, drooling and blank stare for about 1 week, and admitted for possible seizure, HHS and possible CVA.  In ED, he had left-sided neurologic deficit with dysarthria.  Blood glucose elevated to 797 but not acidotic.  Serum osmolality 323.  Lactic acid 2.2.  CT head with probable subacute infarct in right frontal lobe and old right parietal infarct.  Reportedly, patient's neurologic deficits resolved in ED, and reconsult shifted to possible seizure versus stroke.  He was loaded with Keppra and started on insulin drip.   MRI brain with probable hemorrhagic infarct involving the anterior right frontal lobe and left parietal occipital cortex with additional chronic right parietal infarct, right frontotemporal region enhancement suggesting changes related to acute seizure, possible toxic insult involving the caudate and lentiform nuclei bilaterally.  Cardiology recommended interval follow-up brain MRI.  CTA head and neck without significant finding.  Patient had intermittent seizure activities confirmed on continuous EEG.  Keppra increased to 1000 mg twice daily.  Dilantin and Trileptal added.  Neurology following.  TTE with EF of 10-15%, LV thrombus, moderate RV dysfunction and dilated IVC.  Cardiology following.  Assessment & Plan:   Principal Problem:   Acute cerebrovascular accident (CVA) (HCC) Active Problems:   HTN (hypertension)   HLD (hyperlipidemia)   Type 2 diabetes mellitus (HCC)   Hyperosmolar hyperglycemic state (HHS) (HCC)   Neurological deficit present   Intracerebral hemorrhage   LV (left ventricular) mural thrombus   Seizure (HCC)   Chronic systolic CHF/LV thrombus: TTE with EF of  10-15%, LV thrombus, moderate RV dysfunction and dilated IVC. He has not had subsequent TTE or follow-up.  Supposed to be on Coreg and Entresto but not compliant. -Advanced HF/cardiology managing -GDMT-metoprolol, losartan, Aldactone, Lasix and Corlanor -A1c too high for SGLT2 inhibitors. -Continue statin and Zetia.  Needs follow-up at lipid clinic -Monitor fluid status, renal functions and electrolytes  LV thrombus: INR therapeutic.  Was bridged with Lovenox which has been discontinued.  Continue Coumadin, pharmacy dosed.  Sinus tachycardia/uncontrolled BP: Heart rate still elevated but improving ,asymptomatic. -Cardiology changed Coreg to metoprolol for better rate control and now switching to Toprol-XL 200 mg p.o. daily.  Subacute hemorrhagic CVA-presented with left arm and face tingling.  Noted to have some dysarthria.  Had some left facial droop and residual motor and sensory deficit on the left. CT and MRI with probable subacute hemorrhagic infarct in right frontal and left parietal occipital areas. CTA head and neck basically negative.  He is noncompliant.  A lot of risk factors.  LDL is 305.  TTE with severely reduced LVEF and LV thrombus -Neurology managing-on low-dose aspirin and high intensity statin -Needs follow-up at lipid clinic. -Appreciate PT/OT/SLP eval-no need identified.  He has very minimal deficit on the left lower extremity but no other deficit  Seizures: Intermittent seizure activity confirmed on continuous EEG.  Likely due to CVA.  -Neurology managing-on Keppra, Dilantin and Trileptal, dose adjusting based on continuous EEG. -Seizure and fall precautions -Continue PT/OT/SLP-no need identified.  Uncontrolled IDDM-2 with HHS and hyperlipidemia: A1c> 15.5%.  Some hypoglycemia this morning. Reduce Lantus to 20 units twice daily. -Continue NovoLog 6 units 3 times daily with meals -Continue SSI/moderate -High intensity statin as above  Noncompliance -  Counseled on  this multiple times  History of COVID-19 infection -COVID-19 PCR negative now   DVT prophylaxis: coumadin    Code Status: Full Code  Family Communication: None present at bedside.  Plan of care discussed with patient in length and he verbalized understanding and agreed with it.  Status is: Inpatient  Remains inpatient appropriate because:Inpatient level of care appropriate due to severity of illness   Dispo: The patient is from: Home              Anticipated d/c is to: Home              Patient currently is not medically stable to d/c.   Difficult to place patient No        Estimated body mass index is 19.85 kg/m as calculated from the following:   Height as of this encounter: 5\' 11"  (1.803 m).   Weight as of this encounter: 64.5 kg.      Nutritional status:               Consultants:   Neurology  Cardiology  Procedures:   None  Antimicrobials:  Anti-infectives (From admission, onward)   None         Subjective: Seen and examined.  No complaints.  Objective: Vitals:   09/17/20 2300 09/18/20 0319 09/18/20 0805 09/18/20 1202  BP: 90/80 102/78 111/90 99/85  Pulse: 97 96 99 (!) 108  Resp: 18 18 17 16   Temp: 98 F (36.7 C) 98.1 F (36.7 C) 98 F (36.7 C) 98.3 F (36.8 C)  TempSrc: Oral Oral Oral Oral  SpO2: 98% 100% 99% 100%  Weight:      Height:        Intake/Output Summary (Last 24 hours) at 09/18/2020 1218 Last data filed at 09/17/2020 2329 Gross per 24 hour  Intake --  Output 400 ml  Net -400 ml   Filed Weights   09/13/20 1100  Weight: 64.5 kg    Examination:  General exam: Appears calm and comfortable  Respiratory system: Clear to auscultation. Respiratory effort normal. Cardiovascular system: S1 & S2 heard, RRR. No JVD, murmurs, rubs, gallops or clicks. No pedal edema. Gastrointestinal system: Abdomen is nondistended, soft and nontender. No organomegaly or masses felt. Normal bowel sounds heard. Central nervous  system: Alert and oriented. No focal neurological deficits except very mild left lower extremity weakness. Extremities: Symmetric 5 x 5 power. Skin: No rashes, lesions or ulcers.  Psychiatry: Judgement and insight appear normal. Mood & affect appropriate.    Data Reviewed: I have personally reviewed following labs and imaging studies  CBC: Recent Labs  Lab 09/14/20 0650 09/15/20 0008 09/16/20 0345 09/17/20 0157 09/18/20 0521  WBC 5.7 5.8 6.5 6.9 7.3  HGB 15.3 13.2 13.7 13.2 13.0  HCT 44.4 39.8 40.7 40.5 38.7*  MCV 83.6 85.0 85.0 88.4 85.6  PLT 355 412* 319 395 313   Basic Metabolic Panel: Recent Labs  Lab 09/13/20 0255 09/14/20 0650 09/14/20 2322 09/15/20 0008 09/16/20 0345 09/17/20 0157 09/18/20 0521  NA 132*   < > 135 134* 136 142 139  K 4.0   < > 3.7 3.8 3.7 4.4 3.7  CL 94*   < > 100 97* 100 103 103  CO2 25   < > 27 24 26 28 27   GLUCOSE 186*   < > 115* 106* 81 60* 50*  BUN 15   < > 14 14 11 13 10   CREATININE 0.87   < > 0.78  0.72 0.87 1.01 0.88  CALCIUM 9.3   < > 8.8* 8.8* 9.0 9.3 8.7*  MG 1.8  --  2.0 1.9 1.9 2.2 1.8  PHOS 4.3  --   --   --   --   --   --    < > = values in this interval not displayed.   GFR: Estimated Creatinine Clearance: 104.9 mL/min (by C-G formula based on SCr of 0.88 mg/dL). Liver Function Tests: Recent Labs  Lab 09/11/20 2019 09/13/20 0255  AST 19  --   ALT 17  --   ALKPHOS 70  --   BILITOT 0.7  --   PROT 8.3*  --   ALBUMIN 3.8 3.1*   No results for input(s): LIPASE, AMYLASE in the last 168 hours. No results for input(s): AMMONIA in the last 168 hours. Coagulation Profile: Recent Labs  Lab 09/15/20 0008 09/16/20 0345 09/16/20 1049 09/17/20 0157 09/18/20 0521  INR 1.1 2.0* 2.3* 2.2* 2.8*   Cardiac Enzymes: Recent Labs  Lab 09/11/20 2039  CKTOTAL 67   BNP (last 3 results) No results for input(s): PROBNP in the last 8760 hours. HbA1C: No results for input(s): HGBA1C in the last 72 hours. CBG: Recent Labs  Lab  09/17/20 1616 09/17/20 2122 09/18/20 0614 09/18/20 0628 09/18/20 1206  GLUCAP 95 149* 59* 82 128*   Lipid Profile: No results for input(s): CHOL, HDL, LDLCALC, TRIG, CHOLHDL, LDLDIRECT in the last 72 hours. Thyroid Function Tests: No results for input(s): TSH, T4TOTAL, FREET4, T3FREE, THYROIDAB in the last 72 hours. Anemia Panel: No results for input(s): VITAMINB12, FOLATE, FERRITIN, TIBC, IRON, RETICCTPCT in the last 72 hours. Sepsis Labs: Recent Labs  Lab 09/11/20 2039  LATICACIDVEN 2.2*    Recent Results (from the past 240 hour(s))  SARS CORONAVIRUS 2 (TAT 6-24 HRS) Nasopharyngeal Nasopharyngeal Swab     Status: None   Collection Time: 09/12/20 12:57 AM   Specimen: Nasopharyngeal Swab  Result Value Ref Range Status   SARS Coronavirus 2 NEGATIVE NEGATIVE Final    Comment: (NOTE) SARS-CoV-2 target nucleic acids are NOT DETECTED.  The SARS-CoV-2 RNA is generally detectable in upper and lower respiratory specimens during the acute phase of infection. Negative results do not preclude SARS-CoV-2 infection, do not rule out co-infections with other pathogens, and should not be used as the sole basis for treatment or other patient management decisions. Negative results must be combined with clinical observations, patient history, and epidemiological information. The expected result is Negative.  Fact Sheet for Patients: HairSlick.no  Fact Sheet for Healthcare Providers: quierodirigir.com  This test is not yet approved or cleared by the [place] FDA and  has been authorized for detection and/or diagnosis of SARS-CoV-2 by FDA under an Emergency Use Authorization (EUA). This EUA will remain  in effect (meaning this test can be used) for the duration of the COVID-19 declaration under Se ction 564(b)(1) of the Act, 21 U.S.C. section 360bbb-3(b)(1), unless the authorization is terminated or revoked sooner.  Performed  at [facility], [address], [place], [place] [address]       Radiology Studies: No results found.  Scheduled Meds: .  stroke: mapping our early stages of recovery book   Does not apply Once  . aspirin EC  81 mg Oral Daily  . atorvastatin  80 mg Oral Daily  . digoxin  0.125 mg Oral Daily  . ezetimibe  10 mg Oral Daily  . furosemide  20 mg Oral Daily  . insulin  aspart  0-15 Units Subcutaneous TID WC  . insulin aspart  0-5 Units Subcutaneous QHS  . insulin aspart  6 Units Subcutaneous TID WC  . insulin detemir  20 Units Subcutaneous BID  . ivabradine  7.5 mg Oral BID WC  . levETIRAcetam  1,500 mg Oral BID  . living well with diabetes book   Does not apply Once  . losartan  12.5 mg Oral Daily  . metoprolol succinate  200 mg Oral Daily  . OXcarbazepine  600 mg Oral BID  . phenytoin  100 mg Oral TID  . spironolactone  12.5 mg Oral Daily  . Warfarin - Pharmacist Dosing Inpatient   Does not apply q1600   Continuous Infusions:   LOS: 6 days   Time spent: 30 minutes   Hughie Closs, MD [facility]  09/18/2020, 12:18 PM   How to contact the [facility] Attending or Consulting provider 7A - 7P or covering provider during after hours 7P -7A, for this patient?  1. Check the care team in [facility] and look for a) attending/consulting TRH provider listed and b) the [name] team listed. Page or secure chat 7A-7P. 2. Log into [email or web address] and use [place]'s universal password to access. If you do not have the password, please contact the hospital operator. 3. Locate the [facility] provider you are looking for under [facility] and page to a number that you can be directly reached. 4. If you still have difficulty reaching the provider, please page the [facility] (Director on Call) for the Hospitalists listed on amion for assistance.

## 2020-09-18 NOTE — Progress Notes (Signed)
Physical Therapy Treatment Patient Details Name: Jason Stevenson MRN: [id number] DOB: [birth year] Today's Date: 09/18/2020    History of Present Illness Pt is a 35 y/o male admitted 5/30 secondary to increased L sided twitching and tingling. Workup for possible seizure. MRI+ subacute hemorrhagic infarcts involving the anterior right frontal lobe and left parieto-occipital cortex with additional chronic right parietal infarct. Pt also found to be in Hyperosmolar hyperglycemic state, chronic systolic heart failure w/ new LV thrombus. PMH includes HTN, DM, CHF, nonischemic cardiomyopathy    PT Comments    Pt progressing towards physical therapy goals. Session limited by continuous EEG. Pt tolerated therapeutic exercise well with HR elevating to 124 bpm at times. At rest, BP hovering between 117-120 bpm. Noted decreased coordination of the LLE during standing exercise, and tendency to cross midline during standing straight leg raises. With cues, pt able to correct. Will continue to follow and progress as able per POC.    Follow Up Recommendations  Outpatient PT (Outpatient Neuro PT)     Equipment Recommendations  None recommended by PT    Recommendations for Other Services       Precautions / Restrictions Precautions Precautions: Fall Precaution Comments: Watch HR, continuous EEG monitoring Restrictions Weight Bearing Restrictions: No    Mobility  Bed Mobility Overal bed mobility: Modified Independent Bed Mobility: Supine to Sit;Sit to Supine           General bed mobility comments: Therapist assisted with line management. Pt with no assist required, and was able to reposition in bed at end of session.    Transfers Overall transfer level: Needs assistance Equipment used: None Transfers: Sit to/from Stand Sit to Stand: Supervision Stand pivot transfers: Supervision       General transfer comment: Light supervision for safety. No unsteadiness or LOB  noted.  Ambulation/Gait             General Gait Details: Ambulation not tested this date due to continuous EEG.   Stairs             Wheelchair Mobility    Modified Rankin (Stroke Patients Only) Modified Rankin (Stroke Patients Only) Pre-Morbid Rankin Score: No symptoms Modified Rankin: Moderately severe disability     Balance Overall balance assessment: Needs assistance Sitting-balance support: No upper extremity supported;Feet supported Sitting balance-Leahy Scale: Normal     Standing balance support: No upper extremity supported Standing balance-Leahy Scale: Fair Standing balance comment: prefers single UE support with higher level balance, but marching and walking without UE support                            Cognition Arousal/Alertness: Awake/alert Behavior During Therapy: WFL for tasks assessed/performed (Giddy - laughing and giggling throughout session.) Overall Cognitive Status: Impaired/Different from baseline Area of Impairment: Attention;Problem solving;Following commands                   Current Attention Level: Alternating Memory: Decreased short-term memory Following Commands: Follows multi-step commands with increased time Safety/Judgement: Decreased awareness of safety;Decreased awareness of deficits Awareness: Emergent Problem Solving: Slow processing        Exercises General Exercises - Lower Extremity Heel Slides: 15 reps (Alternating, flexion of one leg while other is extending (modified bicycles)) Hip ABduction/ADduction: 15 reps;Supine;Standing Straight Leg Raises: 15 reps;Supine;Standing Mini-Sqauts: 20 reps (Full squats to EOB and back to full standing. x10 to complete sit, x10 tapping bed with hips and back to full stand.)  General Comments        Pertinent Vitals/Pain Pain Assessment: No/denies pain    Home Living                      Prior Function            PT Goals (current goals  can now be found in the care plan section) Acute Rehab PT Goals Patient Stated Goal: to be normal again PT Goal Formulation: With patient Time For Goal Achievement: 09/26/20 Potential to Achieve Goals: Good Progress towards PT goals: Progressing toward goals    Frequency    Min 4X/week      PT Plan Current plan remains appropriate    Co-evaluation              AM-PAC PT "6 Clicks" Mobility   Outcome Measure  Help needed turning from your back to your side while in a flat bed without using bedrails?: None Help needed moving from lying on your back to sitting on the side of a flat bed without using bedrails?: None Help needed moving to and from a bed to a chair (including a wheelchair)?: A Little Help needed standing up from a chair using your arms (e.g., wheelchair or bedside chair)?: A Little Help needed to walk in hospital room?: A Little Help needed climbing 3-5 steps with a railing? : A Little 6 Click Score: 20    End of Session Equipment Utilized During Treatment: Gait belt Activity Tolerance: Patient tolerated treatment well Patient left: in bed;with call bell/phone within reach;with family/visitor present;with bed alarm set Nurse Communication: Mobility status PT Visit Diagnosis: Other abnormalities of gait and mobility (R26.89)     Time: 2355-7322 PT Time Calculation (min) (ACUTE ONLY): 38 min  Charges:  $Therapeutic Exercise: 23-37 mins $Therapeutic Activity: 8-22 mins                     Jason Stevenson, PT, DPT Acute Rehabilitation Services Pager: [phone] Office: [phone]    Jason Stevenson 09/18/2020, 1:15 PM

## 2020-09-18 NOTE — Progress Notes (Signed)
Hypoglycemic Event  CBG: 59  Treatment: 8 oz juice  Symptoms: asymptomatic  Follow-up CBG: Time: 0628 CBG Result: 82  Possible Reasons for Event: Pt received 25 units Levemir at HS, which may be too much for pt.  Comments/MD notified: Dr. Tory Emerald

## 2020-09-18 NOTE — Progress Notes (Signed)
EEG maintenance  Performed. No skin breakdown observed at electrode positions T7, Cz, F7.

## 2020-09-18 NOTE — Plan of Care (Signed)
  Problem: Education: Goal: Knowledge of secondary prevention will improve Outcome: Progressing Goal: Knowledge of patient specific risk factors addressed and post discharge goals established will improve Outcome: Progressing Goal: Individualized Educational Video(s) Outcome: Progressing   Problem: Health Behavior/Discharge Planning: Goal: Ability to manage health-related needs will improve Outcome: Progressing   Problem: Ischemic Stroke/TIA Tissue Perfusion: Goal: Complications of ischemic stroke/TIA will be minimized Outcome: Progressing   Problem: Education: Goal: Expressions of having a comfortable level of knowledge regarding the disease process will increase Outcome: Progressing   Problem: Coping: Goal: Ability to adjust to condition or change in health will improve Outcome: Progressing Goal: Ability to identify appropriate support needs will improve Outcome: Progressing   Problem: Health Behavior/Discharge Planning: Goal: Compliance with prescribed medication regimen will improve Outcome: Progressing   Problem: Medication: Goal: Risk for medication side effects will decrease Outcome: Progressing   Problem: Clinical Measurements: Goal: Complications related to the disease process, condition or treatment will be avoided or minimized Outcome: Progressing Goal: Diagnostic test results will improve Outcome: Progressing   Problem: Safety: Goal: Verbalization of understanding the information provided will improve Outcome: Progressing   Problem: Self-Concept: Goal: Level of anxiety will decrease Outcome: Progressing Goal: Ability to verbalize feelings about condition will improve Outcome: Progressing

## 2020-09-18 NOTE — Progress Notes (Signed)
STROKE TEAM PROGRESS NOTE   INTERVAL HISTORY Seen with wife at bedside today and answered her questions on tx plan. Pt is doing well and has not had any seizure episodes.  Wife agrees and says he is 100% at baseline now. cEEG overnight did not show any further seizures. Dilantin level 12.4.. INR 2.8 today.   Vitals:   09/18/20 0319 09/18/20 0805 09/18/20 1202 09/18/20 1246  BP: 102/78 111/90 99/85 97/82   Pulse: 96 99 (!) 108 (!) 115  Resp: 18 17 16 16   Temp: 98.1 F (36.7 C) 98 F (36.7 C) 98.3 F (36.8 C) 98.3 F (36.8 C)  TempSrc: Oral Oral Oral Oral  SpO2: 100% 99% 100% 99%  Weight:      Height:       CBC:  Recent Labs  Lab 09/17/20 0157 09/18/20 0521  WBC 6.9 7.3  HGB 13.2 13.0  HCT 40.5 38.7*  MCV 88.4 85.6  PLT 395 313   Basic Metabolic Panel:  Recent Labs  Lab 09/13/20 0255 09/14/20 0650 09/17/20 0157 09/18/20 0521  NA 132*   < > 142 139  K 4.0   < > 4.4 3.7  CL 94*   < > 103 103  CO2 25   < > 28 27  GLUCOSE 186*   < > 60* 50*  BUN 15   < > 13 10  CREATININE 0.87   < > 1.01 0.88  CALCIUM 9.3   < > 9.3 8.7*  MG 1.8   < > 2.2 1.8  PHOS 4.3  --   --   --    < > = values in this interval not displayed.   Lipid Panel:  Recent Labs  Lab 09/12/20 0201  CHOL 380*  TRIG 149  HDL 45  CHOLHDL 8.4  VLDL 30  LDLCALC 09/14/20*   HgbA1c:  Recent Labs  Lab 09/12/20 0202  HGBA1C >15.5*   Urine Drug Screen:  Recent Labs  Lab 09/14/20 2322  LABOPIA NONE DETECTED  COCAINSCRNUR NONE DETECTED  LABBENZ NONE DETECTED  AMPHETMU NONE DETECTED  THCU NONE DETECTED  LABBARB NONE DETECTED    Alcohol Level No results for input(s): ETH in the last 168 hours.  IMAGING past 24 hours  CT ANGIO HEAD NECK W WO CM Result Date: 09/12/2020 IMPRESSION: No large vessel occlusion or significant proximal stenosis in the head and neck.   CT Head Wo Contrast Result Date: 09/11/2020 IMPRESSION:  1. No acute intracranial hemorrhage.  2. Probable subacute infarct in the right  frontal lobe. Further evaluation with MRI is recommended. Old right parietal infarct.   MR Brain W and Wo Contrast Result Date: 09/12/2020 IMPRESSION:  1. Probable subacute hemorrhagic infarcts involving the anterior right frontal lobe and left parieto-occipital cortex as above, with additional chronic right parietal infarct.  2. Superimposed gyriform diffusion abnormality and enhancement involving the right frontotemporal region, favored to reflect changes of acute seizure. Correlation with EEG recommended. Possible ischemic change would be the primary differential consideration.  3. Abnormal symmetric T2/FLAIR signal abnormality involving the caudate and lentiform nuclei bilaterally, nonspecific, but most suggestive of superimposed/concomitant toxic metabolic derangement or other toxic insult. Correlation with history and laboratory values recommended.  4. A short interval follow-up brain MRI to ensure that these changes resolve is recommended.   PHYSICAL EXAM Frail young African-American male not in distress. HEENT-  [place]/AT.   Lungs- Respirations unlabored Cardiovascular- regular rate and rhythm Extremities- No edema Neuro - awake, alert, fully orientated x3.  No aphasia, fluent language, follows some commands, able to name and repeat. No gaze palsy, tracking bilaterally, visual field full, PERRL. No facial droop. Tongue midline. Bilateral UEs 5/5, no drift. Bilaterally LEs 5/5, no drift. Sensation symmetrical bilaterally, b/l FTN intact, gait not tested.   ASSESSMENT/PLAN [name] is a 35 y.o. male with history of  DM2, nonischemic cardiomyopathy, chronic systolic CHF, HLD and HTN who presents to the ED with an 8 day history of intermittent left sided twitching, left facial droop, and severe hyperglycemia.   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  Repeat MRI on Monday 6/6. (1 week repeat) pending  Spot EEG: no seizures or epileptiform dischages  LTM EEG 6/1 - seizures without clinical signs arising from right temporo-parietal region, average 4-5/hour, average duration now down to 30-40 seconds.  There is also evidence of nonspecific cortical dysfunction in left frontotemporal region.  LTM EEG 6/2 - seizures without clinical signs arising from right temporo-parietal region,average1-2/hour, average duration about 30 seconds  LTM EEG 6/3 - no more seizure since 1930 on 09/14/20  LTM EEG 6/4- 11 seizures overnigth, down to duration of 10-15 sec.  LTM EEG 6/5: twoseizures without clinical signs arising from right temporo-parietal region, average duration about 12-15seconds,last seizure on 09/17/2020 at0547.Thereare also brief ictal-interictal rhythmic discharges( BIRDS) inright temporo-parietal regionwhichis on the ictal-interictal continuum with high potential for seizures.  Keppra 1500mg  Q12  Continue Dilantin 100mg  Q8h  Continue trileptal 600mg  bid  Dilantin level 21.0->18.4->15.4->14.4 -12.4  LV thrombus Severe cardiomyopathy  Echo July 2020 showed EF 15-20%, LV diffuse hypokinesis. No significant change from  May 2017  On coreg and entresto 49-51mg  bid PTA, but not compliance  This admission 2D echo showed LV thrombus 0.9 x 0.9, EF 10 to 15%.  Heart failure team on board  He was bridged with heparin ggt to goal INR, this was dc on 6/4  Coumadin with INR goal 2-3. INR is 2.2 today  On Lasix, Coreg and spironolactone  Medication compliance again emphasized  Chronic / subacute strokes:  Probable chronic/subacute hemorrhagic infarcts involving the anterior right frontal lobe and left parieto-occipital cortex due to LV thrombus and cardiomyopathy with low EF  CT head No acute  abnormality. probable subacute infarct in the right frontal lobe  CTA head & neck: no large vessel occlusion or significant proximal stenosis in the head and neck.   MRI: probable subacute hemorrhagic infarcts involving the anterior right frontal lobe and left parieto-occipital cortex  2D Echo EF 10 to 50% with LV thrombus  SARS coronavirus negative  LDL 305  HgbA1c > 15.5  UDS neg  VTE prophylaxis - scds Diet: heart healthy  No antithrombotic prior to admission, now on aspirin 81 mg daily and Coumadin.   Therapy recommendations:  none  Disposition:  pending  Hypertension  Home meds:  Coreg 9.375mg  bid  Stable  Now on Coreg, Lasix, spironolactone . Long-term BP goal normotensive  Hyperlipidemia  Home meds:  crestor 40mg  daily, not compliant   LDL 305, goal < 70  Now on Lipitor 80 mg daily   High intensity statin   Continue statin at discharge  Diabetes type II Uncontrolled/DKA  Home meds:  Glipizide 10mg  bid, levemir 30 units daily, lispro ss, metformin 500mg  bid - noncompliance  stopped checking his fingerstick glucose readings about  2 weeks ago  blood glucose was > 600 in the ED  Insulin drip -> off  Diabetes coordinator: recommendations for transition  Levemir 18 units 2 hours prior to discontinuing IV insulin  Novolog 0-15 units TID and 0-5 units QHS  Novolog 3 units TID with meals if eats at least 50%  HgbA1c > 15.5, goal < 7.0  CBGs  SSI  Other Stroke Risk Factors  Prior Marijuana use. Quit 2013. UDS negative  ETOH use, advised to drink no more than 1 drink per day  Family hx stroke (aunt)  Coronary artery disease: nonischemic cardiomyopathy  Other Active Problems  Chronic pain: takes tylenol, and ibuprofen at home at home  Neuropathy: takes gabapentin  Hospital day # 6 Continue warfarin for anticoagulation for cardioembolic strokes with target INR goal between 2 and 3.  Continue Dilantin 100 mg 3 times daily, Trileptal 600  mg twice daily and Keppra 1500 mg twice daily for seizures which appear to be controlled.  Repeat MRI is pending today.  Patient could be discharged after that with outpatient follow-up.  Long discussion patient and wife at the bedside and answered questions.  Discussed with Dr.Pahvani.  Greater than 50% time during this 25-minute visit was spent on counseling and coordination of care about his seizures and stroke and answering questions.  Delia Heady MD 09/18/2020 1:28 PM  To contact Stroke Continuity provider, please refer to [email or web address]. After hours, contact General Neurology

## 2020-09-18 NOTE — Progress Notes (Signed)
Inpatient Diabetes Program Recommendations  AACE/ADA: New Consensus Statement on Inpatient Glycemic Control (2015)  Target Ranges:  Prepandial:   less than 140 mg/dL      Peak postprandial:   less than 180 mg/dL (1-2 hours)      Critically ill patients:  140 - 180 mg/dL   Lab Results  Component Value Date   GLUCAP 82 09/18/2020   HGBA1C >15.5 (H) 09/12/2020    Review of Glycemic Control Results for Jason Stevenson, Jason Stevenson (MRN [id number]) as of 09/18/2020 09:36  Ref. Range 09/17/2020 16:16 09/17/2020 21:22 09/18/2020 06:14 09/18/2020 06:28  Glucose-Capillary Latest Ref Range: 70 - 99 mg/dL 95 889 (H) 59 (L) 82   Diabetes history: Type 2 DM Outpatient Diabetes medications: Levemir 30 units daily Humalog 10-20 units TID  Glipizide 10 mg BID Metformin 500 mg BID Current orders for Inpatient glycemic control: Levemir 20 units BID Novolog 0-15 units TID  Novolog 6 units TID  Inpatient Diabetes Program Recommendations:     Noted mild hypoglycemia this AM and subsequent insulin adjustment. In agreement with current plan. Following.  Per DM coordinator note from 5/31: "Novolin 70/30 would likely be best for him until he is insured. They had not heard of 70/30 before and stated they can afford a box of 5 pens for $43."  Thanks, Lujean Rave, MSN, RNC-OB Diabetes Coordinator [phone] (8a-5p)

## 2020-09-18 NOTE — Progress Notes (Signed)
ANTICOAGULATION CONSULT NOTE - Follow Up Consult  Pharmacy Consult for Warfarin  Indication: LV Thrombus  No Known Allergies  Patient Measurements: Height: 5\' 11"  (180.3 cm) Weight: 64.5 kg (142 lb 4.8 oz) IBW/kg (Calculated) : 75.3 Heparin Dosing Weight: 64.5 kg  Vital Signs: Temp: 98.3 F (36.8 C) (06/06 1246) Temp Source: Oral (06/06 1246) BP: 97/82 (06/06 1246) Pulse Rate: 115 (06/06 1246)  Labs: Recent Labs    09/16/20 0345 09/16/20 1049 09/17/20 0157 09/18/20 0521  HGB 13.7  --  13.2 13.0  HCT 40.7  --  40.5 38.7*  PLT 319  --  395 313  LABPROT 22.2* 25.3* 24.3* 29.2*  INR 2.0* 2.3* 2.2* 2.8*  HEPARINUNFRC 0.49  --   --   --   CREATININE 0.87  --  1.01 0.88    Estimated Creatinine Clearance: 104.9 mL/min (by C-G formula based on SCr of 0.88 mg/dL).   Medical History: Past Medical History:  Diagnosis Date  . Chronic systolic CHF (congestive heart failure) (HCC) 06/18/2015  . Diabetes mellitus without complication (HCC)   . Hyperlipidemia   . Hypertension   . Nonischemic cardiomyopathy (HCC) Noted as early as 2007   Per chart review (cards consult note 2011), EF of 40% in 2007, down to 20-25% in 2011    Assessment: 35 yr old man presented with acute neurologic deficits and dysarthia - found to have seizures. MRI showed probable subacute hemorrhagic infarcts involving anterior R frontal lobe and L parieto-occipital cortex. Pharmacy was consulted warfarin and heparin (pt was on no anticoagulants PTA).  ECHO showed LV thrombus. Discussed with HF team and neurology, MRI findings likely chronic in nature and okay to proceed with anticoagulation for LV thrombus.   Of note, patient is receiving phenytoin which can impact INR.    INR very labile. Appeared stable yesterday but has since increased from 2.2>2.8 overnight. No bleeding issues noted, cbc stable. Will drop down on dose tonight.   Goal of Therapy:  INR 2-3 Monitor platelets by anticoagulation protocol:  Yes   Plan:  Warfarin 2 mg x1 tonight Daily INR, heparin level, CBC  30 PharmD., BCPS Clinical Pharmacist 09/18/2020 1:41 PM

## 2020-09-18 NOTE — Procedures (Signed)
Patient Name:Laurie Burrous PPI:[id number] Epilepsy Attending:Kinlie Janice Annabelle Harman Referring Physician/Provider:Dr Eligha Bridegroom Duration:09/17/2020 1901 to 6/6/20221146  Patient history:35 year old male presenting with new onset of left sided twitching, left facial droop and severe hyperglycemia.EEG to evaluate for seizure.  Level of alertness:Awake,asleep  AEDs during EEG study:LEV, PHT, OXC  Technical aspects: This EEG study was done with scalp electrodes positioned according to the 10-20 International system of electrode placement. Electrical activity was acquired at a sampling rate of 500Hz  and reviewed with a high frequency filter of 70Hz  and a low frequency filter of 1Hz . EEG data were recorded continuously and digitally stored.   Description: The posterior dominant rhythm consists of8-9Hz  activity of moderate voltage (25-35 uV) seen predominantly in posterior head regions, symmetric and reactive to eye opening and eye closing.Sleep was characterized by orbicularis, sleep spindles (12 to 14 Hz), maximum frontocentral region. Intermittent 3 to 5 Hz theta-delta slowing was independently seen in left frontotemporal region and right temporoparietal region.  ABNORMALITY - Intermittent slow, left frontotemporal region and right temporoparietal region.  IMPRESSION: This studyis suggestive of cortical dysfunction in left frontotemporal region and right temporoparietal region, likely secondary to underlying strokes.  No seizures or definite epileptiform discharges were seen during the study.   

## 2020-09-18 NOTE — Progress Notes (Signed)
dixcontinued cEEG study.  Removed EEG electrodes.  No skin breakdown observed.   Notified [facility] monitoring.

## 2020-09-18 NOTE — Progress Notes (Signed)
  Speech Language Pathology Treatment: Cognitive-Linquistic  Patient Details Name: Jason Stevenson MRN: [id number] DOB: [birth year] Today's Date: 09/18/2020 Time: 3235-5732 SLP Time Calculation (min) (ACUTE ONLY): 9 min  Assessment / Plan / Recommendation Clinical Impression  F/u after last week's cognitive evaluation.  Re-assessed at bedside - Jason Stevenson recalled the words read to him to during initial assessment. He demonstrated normal selective and alternating attention, functional working memory, adequate verbal problem-solving and awareness.  Speech is clear and fluent without dysarthria. Expressive/receptive language remain normal. There are no further needs identified - pt will not need f/u SLP.     HPI HPI: 35 y/o male admitted secondary to increased L sided twitching and tingling. Workup for possible seizure. MRI+ subacute hemorrhagic infarcts involving the anterior  right frontal lobe and left parieto-occipital cortex as above, with  additional chronic right parietal infarct. Pt also found to be in Hyperosmolar hyperglycemic state. He has had intermittent seizure activities confirmed on continuous EEG.PMH includes HTN, DM, CHF, nonischemic cardiomyopathy.      SLP Plan  Discharge SLP treatment due to (comment) (resolved cognition)       Recommendations                   Follow up Recommendations: None SLP Visit Diagnosis: Cognitive communication deficit (K02.542) Plan: Discharge SLP treatment due to (comment) (resolved cognition)       GO               Jason Stevenson L. Jason Frederic, MA CCC/SLP Acute Rehabilitation Services Office number [phone] Pager [phone]  Jason Stevenson 09/18/2020, 1:30 PM

## 2020-09-18 NOTE — Progress Notes (Addendum)
Advanced Heart Failure Rounding Note  PCP-Cardiologist: Charlton Haws, MD  [facility]: Dr. Shirlee Latch   Subjective:    INR therapeutic at 2.8   6/4 switched carvedilol to metoprolol for improved rate control. Also recently started on ivabradine.  Remains tachycardic, sinus 112.  TSH normal on admit. BMP pending   Feels ok. Denies dyspnea. Feels LUE weakness is improving.   Objective:   Weight Range: 64.5 kg Body mass index is 19.85 kg/m.   Vital Signs:   Temp:  [97.7 F (36.5 C)-98.1 F (36.7 C)] 98.1 F (36.7 C) (06/06 0319) Pulse Rate:  [96-119] 96 (06/06 0319) Resp:  [12-19] 18 (06/06 0319) BP: (90-123)/(78-97) 102/78 (06/06 0319) SpO2:  [98 %-100 %] 100 % (06/06 0319) Last BM Date: 09/15/20  Weight change: Filed Weights   09/13/20 1100  Weight: 64.5 kg    Intake/Output:   Intake/Output Summary (Last 24 hours) at 09/18/2020 0736 Last data filed at 09/17/2020 2329 Gross per 24 hour  Intake --  Output 750 ml  Net -750 ml      Physical Exam   General:  Well appearing, thin. No respiratory difficulty HEENT: normal Neck: supple. JVD 6 cm. Carotids 2+ bilat; no bruits. No lymphadenopathy or thyromegaly appreciated. Cor: PMI nondisplaced. Regular rhythm, tachy rate. No rubs, gallops or murmurs. Lungs: clear Abdomen: soft, nontender, nondistended. No hepatosplenomegaly. No bruits or masses. Good bowel sounds. Extremities: no cyanosis, clubbing, rash, edema Neuro: alert & oriented x 3, cranial nerves grossly intact. moves all 4 extremities w/o difficulty. Affect pleasant.    Telemetry   Sinus Tach 112s   EKG    No new EKG to review   Labs    CBC Recent Labs    09/17/20 0157 09/18/20 0521  WBC 6.9 7.3  HGB 13.2 13.0  HCT 40.5 38.7*  MCV 88.4 85.6  PLT 395 313   Basic Metabolic Panel Recent Labs    11/27/46 0345 09/17/20 0157  NA 136 142  K 3.7 4.4  CL 100 103  CO2 26 28  GLUCOSE 81 60*  BUN 11 13  CREATININE 0.87 1.01  CALCIUM 9.0 9.3   MG 1.9 2.2   Liver Function Tests No results for input(s): AST, ALT, ALKPHOS, BILITOT, PROT, ALBUMIN in the last 72 hours. No results for input(s): LIPASE, AMYLASE in the last 72 hours. Cardiac Enzymes No results for input(s): CKTOTAL, CKMB, CKMBINDEX, TROPONINI in the last 72 hours.  BNP: BNP (last 3 results) No results for input(s): BNP in the last 8760 hours.  ProBNP (last 3 results) No results for input(s): PROBNP in the last 8760 hours.   D-Dimer No results for input(s): DDIMER in the last 72 hours. Hemoglobin A1C No results for input(s): HGBA1C in the last 72 hours. Fasting Lipid Panel No results for input(s): CHOL, HDL, LDLCALC, TRIG, CHOLHDL, LDLDIRECT in the last 72 hours. Thyroid Function Tests No results for input(s): TSH, T4TOTAL, T3FREE, THYROIDAB in the last 72 hours.  Invalid input(s): FREET3  Other results:   Imaging    No results found.   Medications:     Scheduled Medications: .  stroke: mapping our early stages of recovery book   Does not apply Once  . aspirin EC  81 mg Oral Daily  . atorvastatin  80 mg Oral Daily  . ezetimibe  10 mg Oral Daily  . furosemide  20 mg Oral Daily  . insulin aspart  0-15 Units Subcutaneous TID WC  . insulin aspart  0-5 Units Subcutaneous  QHS  . insulin aspart  6 Units Subcutaneous TID WC  . insulin detemir  25 Units Subcutaneous BID  . ivabradine  5 mg Oral BID WC  . levETIRAcetam  1,500 mg Oral BID  . living well with diabetes book   Does not apply Once  . losartan  12.5 mg Oral Daily  . metoprolol tartrate  100 mg Oral BID  . OXcarbazepine  600 mg Oral BID  . phenytoin  100 mg Oral TID  . spironolactone  12.5 mg Oral Daily  . Warfarin - Pharmacist Dosing Inpatient   Does not apply q1600    Infusions:   PRN Medications: acetaminophen **OR** acetaminophen (TYLENOL) oral liquid 160 mg/5 mL **OR** acetaminophen, dextrose, ondansetron (ZOFRAN) IV     Assessment/Plan   1. Chronic systolic CHF:  Nonischemic cardiomyopathy known for years.  Normal coronary CTA in 2014.  Echo this admission with EF 10-15%, LV thrombus, moderate RV dysfunction, dilated IVC. With markedly high LDL and poorly controlled glucose, concern for development of CAD though no chest pain.  On exam, he does not look volume overloaded though IVC dilated on echo.  SBP 90-120s. Continue titration of GDMT  - Coreg switched to metoprolol for better HR control. Switch from Lopressor to Toprol XL given systolic dysfunction 200 mg daily  - Increase Ivabradine to  7.5 mg bid - Consider adding digoxin  - Continue Spironolactone 12.5 daily.  - Continue Lasix 20 mg daily.  - Continue Losartan 12.5 daily  - BMP pending  - Hgb A1c too high for SGLT2i  - Consider right/left cath in future given risk factors though coronary CTA was negative in the past.  -Would hold off for now given need for anticoagulation and recent CVA.  - Would involve paramedicine at discharge. Referral order sent  2. LV thrombus: With associated CVA.  - Restarted anticoagulation.    - INR 2.8 today  3. 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  Still with some dysarthria.  - Anticoagulation ok per neurology.  - Atorvastatin.  4. 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  Now on Keppra.  EEG underway.  5.  Hyperlipidemia: Markedly high LDL.   - Continue atorvastatin 80 mg daily.  - Zetia 10 added  - F/u LP 608 weeks. Lipid Clinic referral if LDL not at goal after improved compliance  6.  Diabetes: Poor control. Hgb A1c >15.5  - insulin per primary team.    HFSW --following--> CSW obtained the patients signature for the disability and Food Stamp referral for the [facility] to reach out to them and patient and spouse are both in agreement to move forward with referrals. No medical insurance.   Will need meds prior to d/c   Length of Stay: [phone] Delmer Islam  09/18/2020, 7:36 AM  Advanced Heart Failure Team Pager  [phone] (M-F; 7a - 5p)  Please contact [facility] for night-coverage after hours (5p -7a ) and weekends on [email or web address]  Patient seen with PA, agree with the above note.   Feels fine today.  Says he had another seizure yesterday am per the EEG.  Left-sided weakness improved.  Now therapeutic on warfarin.   HR remains in 100s, sinus tachy.   General: NAD Neck: No JVD, no thyromegaly or thyroid nodule.  Lungs: Clear to auscultation bilaterally with normal respiratory effort. CV: Lateral PMI.  Heart regular S1/S2, no S3/S4, no murmur.  No peripheral edema.   Abdomen: Soft, nontender, no hepatosplenomegaly, no distention.  Skin: Intact without lesions or rashes.  Neurologic: Alert and oriented x 3.  Psych: Normal affect. Extremities: No clubbing or cyanosis.  HEENT: Normal.   Transition to Toprol XL 200 mg daily, increase Corlanor to 7.5 mg bid, and start digoxin 0.125.  Continue current spironolactone and losartan, can consider addition of Farxiga next.   Clinically, does not have low output HF.  Symptomatically feels well.   Marca Ancona 09/18/2020 8:45 AM

## 2020-09-19 ENCOUNTER — Other Ambulatory Visit (HOSPITAL_COMMUNITY): Payer: Self-pay

## 2020-09-19 LAB — BASIC METABOLIC PANEL
Anion gap: 10 (ref 5–15)
BUN: 16 mg/dL (ref 6–20)
CO2: 27 mmol/L (ref 22–32)
Calcium: 8.7 mg/dL — ABNORMAL LOW (ref 8.9–10.3)
Chloride: 101 mmol/L (ref 98–111)
Creatinine, Ser: 0.93 mg/dL (ref 0.61–1.24)
GFR, Estimated: >60 mL/min (ref >60)
Glucose, Bld: 103 mg/dL — ABNORMAL HIGH (ref 70–99)
Potassium: 3.9 mmol/L (ref 3.5–5.1)
Sodium: 138 mmol/L (ref 135–145)

## 2020-09-19 LAB — CBC
HCT: 36.4 % — ABNORMAL LOW (ref 39.0–52.0)
Hemoglobin: 12.1 g/dL — ABNORMAL LOW (ref 13.0–17.0)
MCH: 28.8 pg (ref 26.0–34.0)
MCHC: 33.2 g/dL (ref 30.0–36.0)
MCV: 86.7 fL (ref 80.0–100.0)
Platelets: 308 10*3/uL (ref 150–400)
RBC: 4.2 MIL/uL — ABNORMAL LOW (ref 4.22–5.81)
RDW: 11.9 % (ref 11.5–15.5)
WBC: 6.2 10*3/uL (ref 4.0–10.5)
nRBC: 0 % (ref 0.0–0.2)

## 2020-09-19 LAB — GLUCOSE, CAPILLARY
Glucose-Capillary: 80 mg/dL (ref 70–99)
Glucose-Capillary: 218 mg/dL — ABNORMAL HIGH (ref 70–99)

## 2020-09-19 LAB — PHENYTOIN LEVEL, TOTAL: Phenytoin Lvl: 12.2 ug/mL (ref 10.0–20.0)

## 2020-09-19 LAB — PROTIME-INR
INR: 2.5 — ABNORMAL HIGH (ref 0.8–1.2)
Prothrombin Time: 26.8 seconds — ABNORMAL HIGH (ref 11.4–15.2)

## 2020-09-19 LAB — MAGNESIUM: Magnesium: 1.9 mg/dL (ref 1.7–2.4)

## 2020-09-19 LAB — 10-HYDROXYCARBAZEPINE: Triliptal/MTB(Oxcarbazepin): 16 ug/mL (ref 10–35)

## 2020-09-19 MED ORDER — WARFARIN SODIUM 5 MG PO TABS
5.0000 mg | ORAL_TABLET | Freq: Every day | ORAL | 0 refills | Status: DC
  Filled 2020-09-19: qty 30, 30d supply, fill #0

## 2020-09-19 MED ORDER — NOVOLIN 70/30 FLEXPEN (70-30) 100 UNIT/ML ~~LOC~~ SUPN
25.0000 [IU] | PEN_INJECTOR | Freq: Two times a day (BID) | SUBCUTANEOUS | 0 refills | Status: DC
  Filled 2020-09-19: qty 15, 30d supply, fill #0

## 2020-09-19 MED ORDER — GLIPIZIDE 10 MG PO TABS
10.0000 mg | ORAL_TABLET | Freq: Every day | ORAL | 0 refills | Status: DC
  Filled 2020-09-19: qty 30, 30d supply, fill #0

## 2020-09-19 MED ORDER — OXCARBAZEPINE 300 MG PO TABS
600.0000 mg | ORAL_TABLET | Freq: Two times a day (BID) | ORAL | 0 refills | Status: DC
Start: 2020-09-19 — End: 2020-10-14
  Filled 2020-09-19: qty 120, 30d supply, fill #0

## 2020-09-19 MED ORDER — ASPIRIN 81 MG PO TBEC
81.0000 mg | DELAYED_RELEASE_TABLET | Freq: Every day | ORAL | 0 refills | Status: DC
  Filled 2020-09-19: qty 30, 30d supply, fill #0

## 2020-09-19 MED ORDER — FUROSEMIDE 20 MG PO TABS
20.0000 mg | ORAL_TABLET | Freq: Every day | ORAL | 0 refills | Status: DC
  Filled 2020-09-19: qty 30, 30d supply, fill #0

## 2020-09-19 MED ORDER — PHENYTOIN SODIUM EXTENDED 100 MG PO CAPS
100.0000 mg | ORAL_CAPSULE | Freq: Three times a day (TID) | ORAL | 0 refills | Status: DC
  Filled 2020-09-19: qty 90, 30d supply, fill #0

## 2020-09-19 MED ORDER — INSULIN PEN NEEDLE 32G X 4 MM MISC
100.0000 | Freq: Three times a day (TID) | 0 refills | Status: DC
Start: 2020-09-19 — End: 2020-11-21
  Filled 2020-09-19: qty 100, 30d supply, fill #0

## 2020-09-19 MED ORDER — DIGOXIN 125 MCG PO TABS
0.1250 mg | ORAL_TABLET | Freq: Every day | ORAL | 0 refills | Status: DC
  Filled 2020-09-19: qty 30, 30d supply, fill #0

## 2020-09-19 MED ORDER — LEVETIRACETAM 750 MG PO TABS
1500.0000 mg | ORAL_TABLET | Freq: Two times a day (BID) | ORAL | 0 refills | Status: DC
  Filled 2020-09-19: qty 120, 30d supply, fill #0

## 2020-09-19 MED ORDER — SPIRONOLACTONE 25 MG PO TABS
12.5000 mg | ORAL_TABLET | Freq: Every day | ORAL | 0 refills | Status: DC
  Filled 2020-09-19: qty 15, 30d supply, fill #0

## 2020-09-19 MED ORDER — METOPROLOL SUCCINATE ER 200 MG PO TB24
200.0000 mg | ORAL_TABLET | Freq: Every day | ORAL | 0 refills | Status: DC
  Filled 2020-09-19: qty 30, 30d supply, fill #0

## 2020-09-19 MED ORDER — EZETIMIBE 10 MG PO TABS
10.0000 mg | ORAL_TABLET | Freq: Every day | ORAL | 0 refills | Status: DC
  Filled 2020-09-19: qty 30, 30d supply, fill #0

## 2020-09-19 MED ORDER — IVABRADINE HCL 7.5 MG PO TABS
7.5000 mg | ORAL_TABLET | Freq: Two times a day (BID) | ORAL | 0 refills | Status: AC
  Filled 2020-09-19 – 2020-10-12 (×2): qty 60, 30d supply, fill #0

## 2020-09-19 MED ORDER — ATORVASTATIN CALCIUM 80 MG PO TABS
80.0000 mg | ORAL_TABLET | Freq: Every day | ORAL | 0 refills | Status: DC
  Filled 2020-09-19: qty 30, 30d supply, fill #0

## 2020-09-19 MED ORDER — LOSARTAN POTASSIUM 25 MG PO TABS
12.5000 mg | ORAL_TABLET | Freq: Every day | ORAL | 0 refills | Status: DC
  Filled 2020-09-19: qty 15, 30d supply, fill #0

## 2020-09-19 NOTE — Progress Notes (Signed)
STROKE TEAM PROGRESS NOTE   INTERVAL HISTORY   Pt i continues to do well and has not had any seizure episodes.  Wife agrees and says he is 100% at baseline now.  Repeat MRI scan showed some impaired diffusion and FLAIR changes in the basal ganglia which appear improved compared to the previous MRI in number consistent with seizure effect and strokes.  He has no new complaints.  He wants to go home. His INR is optimal at 2.5 and Dilantin level is satisfactory at 12.2. Vitals:   09/18/20 2318 09/19/20 0402 09/19/20 0732 09/19/20 1100  BP: 103/84 110/85 107/62 110/85  Pulse: (!) 106 98 (!) 109 (!) 117  Resp: (!) 26 11 17    Temp: 98.5 F (36.9 C) 98.8 F (37.1 C) 98.4 F (36.9 C)   TempSrc: Oral Oral Oral   SpO2: 100% 99% 100%   Weight:      Height:       CBC:  Recent Labs  Lab 09/18/20 0521 09/19/20 0523  WBC 7.3 6.2  HGB 13.0 12.1*  HCT 38.7* 36.4*  MCV 85.6 86.7  PLT 313 308   Basic Metabolic Panel:  Recent Labs  Lab 09/13/20 0255 09/14/20 0650 09/18/20 0521 09/19/20 0523  NA 132*   < > 139 138  K 4.0   < > 3.7 3.9  CL 94*   < > 103 101  CO2 25   < > 27 27  GLUCOSE 186*   < > 50* 103*  BUN 15   < > 10 16  CREATININE 0.87   < > 0.88 0.93  CALCIUM 9.3   < > 8.7* 8.7*  MG 1.8   < > 1.8 1.9  PHOS 4.3  --   --   --    < > = values in this interval not displayed.   Lipid Panel:  No results for input(s): CHOL, TRIG, HDL, CHOLHDL, VLDL, LDLCALC in the last 168 hours. HgbA1c:  No results for input(s): HGBA1C in the last 168 hours. Urine Drug Screen:  Recent Labs  Lab 09/14/20 2322  LABOPIA NONE DETECTED  COCAINSCRNUR NONE DETECTED  LABBENZ NONE DETECTED  AMPHETMU NONE DETECTED  THCU NONE DETECTED  LABBARB NONE DETECTED    Alcohol Level No results for input(s): ETH in the last 168 hours.  IMAGING past 24 hours  CT ANGIO HEAD NECK W WO CM Result Date: 09/12/2020 IMPRESSION: No large vessel occlusion or significant proximal stenosis in the head and neck.   CT  Head Wo Contrast Result Date: 09/11/2020 IMPRESSION:  1. No acute intracranial hemorrhage.  2. Probable subacute infarct in the right frontal lobe. Further evaluation with MRI is recommended. Old right parietal infarct.   MR Brain W and Wo Contrast Result Date: 09/12/2020 IMPRESSION:  1. Probable subacute hemorrhagic infarcts involving the anterior right frontal lobe and left parieto-occipital cortex as above, with additional chronic right parietal infarct.  2. Superimposed gyriform diffusion abnormality and enhancement involving the right frontotemporal region, favored to reflect changes of acute seizure. Correlation with EEG recommended. Possible ischemic change would be the primary differential consideration.  3. Abnormal symmetric T2/FLAIR signal abnormality involving the caudate and lentiform nuclei bilaterally, nonspecific, but most suggestive of superimposed/concomitant toxic metabolic derangement or other toxic insult. Correlation with history and laboratory values recommended.  4. A short interval follow-up brain MRI to ensure that these changes resolve is recommended.   PHYSICAL EXAM Frail young African-American male not in distress. HEENT-  [place]/AT.   Lungs-  Respirations unlabored Cardiovascular- regular rate and rhythm Extremities- No edema Neuro - awake, alert, fully orientated x3.  No aphasia, fluent language, follows some commands, able to name and repeat. No gaze palsy, tracking bilaterally, visual field full, PERRL. No facial droop. Tongue midline. Bilateral UEs 5/5, no drift. Bilaterally LEs 5/5, no drift. Sensation symmetrical bilaterally, b/l FTN intact, gait not tested.   ASSESSMENT/PLAN [name] is a 35 y.o. male with history of  DM2, nonischemic cardiomyopathy, chronic systolic CHF, HLD and HTN who presents to the ED with an 8 day history of intermittent left sided twitching, left facial droop, and severe hyperglycemia.   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  Repeat MRI on Monday 6/6. (1 week repeat) pending  Spot EEG: no seizures or epileptiform dischages  LTM EEG 6/1 - seizures without clinical signs arising from right temporo-parietal region, average 4-5/hour, average duration now down to 30-40 seconds.  There is also evidence of nonspecific cortical dysfunction in left frontotemporal region.  LTM EEG 6/2 - seizures without clinical signs arising from right temporo-parietal region,average1-2/hour, average duration about 30 seconds  LTM EEG 6/3 - no more seizure since 1930 on 09/14/20  LTM EEG 6/4- 11 seizures overnigth, down to duration of 10-15 sec.  LTM EEG 6/5: twoseizures without clinical signs arising from right temporo-parietal region, average duration about 12-15seconds,last seizure on 09/17/2020 at0547.Thereare also brief ictal-interictal rhythmic discharges( BIRDS) inright temporo-parietal regionwhichis on the ictal-interictal continuum with high potential for seizures.  Keppra 1500mg  Q12  Continue Dilantin 100mg  Q8h  Continue trileptal 600mg  bid  Dilantin level 21.0->18.4->15.4->14.4 -12.4  LV thrombus Severe cardiomyopathy  Echo July 2020 showed EF 15-20%, LV diffuse hypokinesis. No significant change from  May 2017  On coreg and entresto 49-51mg  bid PTA, but not compliance  This admission 2D echo showed LV thrombus 0.9 x 0.9, EF 10 to 15%.  Heart failure team on board  He was bridged with heparin ggt to goal INR, this was dc on 6/4  Coumadin with INR goal 2-3. INR is 2.2 today  On Lasix, Coreg and spironolactone  Medication compliance again emphasized  Chronic / subacute strokes:  Probable chronic/subacute hemorrhagic infarcts involving the  anterior right frontal lobe and left parieto-occipital cortex due to LV thrombus and cardiomyopathy with low EF  CT head No acute abnormality. probable subacute infarct in the right frontal lobe  CTA head & neck: no large vessel occlusion or significant proximal stenosis in the head and neck.   MRI: probable subacute hemorrhagic infarcts involving the anterior right frontal lobe and left parieto-occipital cortex  2D Echo EF 10 to 50% with LV thrombus  SARS coronavirus negative  LDL 305  HgbA1c > 15.5  UDS neg  VTE prophylaxis - scds Diet: heart healthy  No antithrombotic prior to admission, now on aspirin 81 mg daily and Coumadin.   Therapy recommendations:  none  Disposition:  pending  Hypertension  Home meds:  Coreg 9.375mg  bid  Stable  Now on Coreg, Lasix, spironolactone . Long-term BP goal normotensive  Hyperlipidemia  Home meds:  crestor 40mg  daily, not compliant   LDL 305, goal < 70  Now on Lipitor 80 mg daily   High intensity statin   Continue statin at discharge  Diabetes type II Uncontrolled/DKA  Home meds:  Glipizide 10mg  bid, levemir  30 units daily, lispro ss, metformin 500mg  bid - noncompliance  stopped checking his fingerstick glucose readings about 2 weeks ago  blood glucose was > 600 in the ED  Insulin drip -> off  Diabetes coordinator: recommendations for transition  Levemir 18 units 2 hours prior to discontinuing IV insulin  Novolog 0-15 units TID and 0-5 units QHS  Novolog 3 units TID with meals if eats at least 50%  HgbA1c > 15.5, goal < 7.0  CBGs  SSI  Other Stroke Risk Factors  Prior Marijuana use. Quit 2013. UDS negative  ETOH use, advised to drink no more than 1 drink per day  Family hx stroke (aunt)  Coronary artery disease: nonischemic cardiomyopathy  Other Active Problems  Chronic pain: takes tylenol, and ibuprofen at home at home  Neuropathy: takes gabapentin  Hospital day # 7 Continue warfarin for  anticoagulation for cardioembolic strokes with target INR goal between 2 and 3.  Continue Dilantin 100 mg 3 times daily, Trileptal 600 mg twice daily and Keppra 1500 mg twice daily for seizures which appear to be controlled.  .  Patient can be discharged after that with outpatient follow-up.  Long discussion patient and wife at the bedside and answered questions.  Discussed with Dr.Pahvani patient and wife and answered questions.  Greater than 50% time during this 25-minute visit was spent on counseling and coordination of care about his seizures and stroke and answering questions.  2014 MD 09/19/2020 5:15 PM  To contact Stroke Continuity provider, please refer to 11/19/2020. After hours, contact General Neurology

## 2020-09-19 NOTE — Progress Notes (Signed)
Physical Therapy Treatment Patient Details Name: Jason Stevenson MRN: [id number] DOB: Oct 30, 1985 Today's Date: 09/19/2020    History of Present Illness Pt is a 35 y/o male admitted 5/30 secondary to increased L sided twitching and tingling. Workup for possible seizure. MRI+ subacute hemorrhagic infarcts involving the anterior right frontal lobe and left parieto-occipital cortex with additional chronic right parietal infarct. Pt also found to be in Hyperosmolar hyperglycemic state, chronic systolic heart failure w/ new LV thrombus. PMH includes HTN, DM, CHF, nonischemic cardiomyopathy    PT Comments    Pt received in bed, pleasant and agreeable to participation in therapy. He demonstrates mod I bed mobility. Supervision provided for transfers and ambulation 300' without AD. Pt presents with slow, steady gait. Max HR 125. Pt returned to bed at end of session. Current POC remains appropriate.     Follow Up Recommendations  Outpatient PT (Neuro OPPT)     Equipment Recommendations  None recommended by PT    Recommendations for Other Services       Precautions / Restrictions Precautions Precautions: Fall;Other (comment) Precaution Comments: watch HR    Mobility  Bed Mobility Overal bed mobility: Modified Independent                  Transfers Overall transfer level: Needs assistance Equipment used: None Transfers: Sit to/from Stand;Stand Pivot Transfers Sit to Stand: Supervision Stand pivot transfers: Supervision       General transfer comment: Light supervision for safety. No unsteadiness or LOB noted.  Ambulation/Gait Ambulation/Gait assistance: Supervision Gait Distance (Feet): 300 Feet Assistive device: None Gait Pattern/deviations: Step-through pattern Gait velocity: decreased Gait velocity interpretation: 1.31 - 2.62 ft/sec, indicative of limited community ambulator General Gait Details: steady gait   Stairs             Wheelchair Mobility     Modified Rankin (Stroke Patients Only) Modified Rankin (Stroke Patients Only) Pre-Morbid Rankin Score: No symptoms Modified Rankin: Moderate disability     Balance Overall balance assessment: Needs assistance Sitting-balance support: No upper extremity supported;Feet supported Sitting balance-Leahy Scale: Normal     Standing balance support: No upper extremity supported;During functional activity Standing balance-Leahy Scale: Good                              Cognition Arousal/Alertness: Awake/alert Behavior During Therapy: WFL for tasks assessed/performed (giddy) Overall Cognitive Status: Impaired/Different from baseline Area of Impairment: Attention;Problem solving;Following commands;Memory;Safety/judgement;Awareness                   Current Attention Level: Alternating Memory: Decreased short-term memory Following Commands: Follows multi-step commands with increased time Safety/Judgement: Decreased awareness of safety;Decreased awareness of deficits Awareness: Emergent Problem Solving: Slow processing        Exercises      General Comments General comments (skin integrity, edema, etc.): Resting HR 114. Max HR 125 during amb.      Pertinent Vitals/Pain Pain Assessment: Faces Faces Pain Scale: Hurts a little bit Pain Location: LLE Pain Descriptors / Indicators: Sore;Tightness Pain Intervention(s): Monitored during session    Home Living                      Prior Function            PT Goals (current goals can now be found in the care plan section) Acute Rehab PT Goals Patient Stated Goal: home Progress towards PT goals: Progressing toward goals  Frequency    Min 4X/week      PT Plan Current plan remains appropriate    Co-evaluation              AM-PAC PT "6 Clicks" Mobility   Outcome Measure  Help needed turning from your back to your side while in a flat bed without using bedrails?: None Help needed  moving from lying on your back to sitting on the side of a flat bed without using bedrails?: None Help needed moving to and from a bed to a chair (including a wheelchair)?: A Little Help needed standing up from a chair using your arms (e.g., wheelchair or bedside chair)?: None Help needed to walk in hospital room?: A Little Help needed climbing 3-5 steps with a railing? : A Little 6 Click Score: 21    End of Session Equipment Utilized During Treatment: Gait belt Activity Tolerance: Patient tolerated treatment well Patient left: in bed;with call bell/phone within reach;with bed alarm set Nurse Communication: Mobility status PT Visit Diagnosis: Other abnormalities of gait and mobility (R26.89)     Time: 6237-6283 PT Time Calculation (min) (ACUTE ONLY): 17 min  Charges:  $Gait Training: 8-22 mins                     Aida Raider, PT  Office # [phone] Pager [phone]    Jason Stevenson 09/19/2020, 8:38 AM

## 2020-09-19 NOTE — TOC Transition Note (Signed)
Transition of Care [facility]) - CM/SW Discharge Note   Patient Details  Name: Jason Stevenson MRN: [id number] Date of Birth: 1986-04-05  [facility] CM/SW Contact:  Kermit Balo, RN Phone Number: 09/19/2020, 11:49 AM   Clinical Narrative:    Patient is discharging home with outpatient therapy through [facility]. Information on the AVS. Wife to provide transport home and supervision at home.  Pts discharge medications sent to [facility] and MATCH applied. Medications will be delivered to the room for home. Pt to use [facility] pharmacy after d/c for medication assistance.   Final next level of care: OP Rehab Barriers to Discharge: Inadequate or no insurance,Barriers Unresolved (comment)   Patient Goals and CMS Choice        Discharge Placement                       Discharge Plan and Services In-house Referral: Clinical Social Work                                   Social Determinants of Health (SDOH) Interventions Food Insecurity Interventions: Assist with [employer] Application Financial Strain Interventions: Other (Comment) (Provided patient with a cafa application) Housing Interventions: Intervention Not Indicated Transportation Interventions: [facility]   Readmission Risk Interventions Readmission Risk Prevention Plan 09/19/2020  Post Dischage Appt Complete  Transportation Screening Complete  Some recent data might be hidden

## 2020-09-19 NOTE — Progress Notes (Signed)
Pt discharged at this time.  He and spouse verbalize understanding of all discharge instructions, including follow up appointments, medications administration, etc.  He has all belongings with him, including cell phone, and TOC medications.

## 2020-09-19 NOTE — Discharge Summary (Signed)
Physician Discharge Summary  Jason Stevenson MKL:[id number] DOB: 01/29/1986 DOA: 09/11/2020  PCP: Jason Pier, MD  Admit date: 09/11/2020 Discharge date: 09/19/2020 30 Day Unplanned Readmission Risk Score   Flowsheet Row ED to Hosp-Admission (Current) from 09/11/2020 in [facility]  30 Day Unplanned Readmission Risk Score (%) 14.67 Filed at 09/19/2020 0801     This score is the patient's risk of an unplanned readmission within 30 days of being discharged (0 -100%). The score is based on dignosis, age, lab data, medications, orders, and past utilization.   Low:  0-14.9   Medium: 15-21.9   High: 22-29.9   Extreme: 30 and above         Admitted From: Home Disposition: Home  Recommendations for Outpatient Follow-up:  1. Follow up with PCP in 1-2 weeks 2. Follow-up with heart failure team in 1 to 2 weeks 3. Follow-up with Coumadin clinic in 1 week 4. Follow-up with outpatient PT 5. Follow-up with neurology in 4 weeks 6. Please obtain BMP/CBC in one week 7. Please follow up with your PCP on the following pending results: Unresulted Labs (From admission, onward)          Start     Ordered   09/16/20 0830  10-Hydroxycarbazepine  Once,   R       Question:  Specimen collection method  Answer:  Lab=Lab collect   09/16/20 0829   09/14/20 0500  Phenytoin level, total  Daily,   R      09/13/20 0954   09/14/20 0500  Protime-INR  Daily,   R      09/13/20 1342   09/14/20 0500  CBC  Daily,   R     Question:  Specimen collection method  Answer:  Lab=Lab collect   09/13/20 2218            Home Health: None Equipment/Devices: None  Discharge Condition: Stable CODE STATUS: Full code Diet recommendation: Cardiac  Subjective: Seen and examined.  Has no complaints.  Eager to go home.  Brief/Interim Summary: [name] with PMH of systolic CHF, IDDM-2, HTN, HLD, COVID-19 infection in 07/2020 and noncompliance with medication presented with tingling in left arm,  left face, drooling and blank stare for about 1 week, and admitted for possible seizure, HHS and possible CVA. In ED, he had left-sided neurologic deficit with dysarthria. Blood glucose elevated to 797 but not acidotic. Serum osmolality 323. Lactic acid 2.2. CT head with probable subacute infarct in right frontal lobe and old right parietal infarct. Reportedly, patient's neurologic deficits resolved in ED, and focus shifted to possible seizure versus stroke. He was loaded with Keppra and started on insulin drip.   MRI brain with probable hemorrhagic infarct involving the anterior right frontal lobe and left parietal occipital cortex with additional chronic right parietal infarct, right frontotemporal region enhancement suggesting changes related to acute seizure, possible toxic insult involving the caudate and lentiform nuclei bilaterally. Neurology recommended interval follow-up brain MRI. CTA head and neck without significant finding.  TTE with EF of 10-15%, LV thrombus, moderate RV dysfunction and dilated IVC.  Chronic systolic CHF/LV thrombus: TTE with EF of 10-15%, LV thrombus, moderate RV dysfunction and dilated IVC.   Supposed to be on Coreg and Entresto but not compliant. -Advanced HF/cardiology was managing this. -GDMT-metoprolol, losartan, Aldactone, Lasix and Corlanor -A1c too high for SGLT2 inhibitors. -Continue statin and Zetia.  Needs follow-up at lipid clinic -Monitor fluid status, renal functions and electrolytes  LV thrombus: Was  bridged with Lovenox which has been discontinued since INR became therapeutic.  Continue Coumadin at 5 mg p.o. daily per pharmacy recommendations.  Sinus tachycardia/uncontrolled BP: Heart rate still elevated but improving ,asymptomatic. -Cardiology changed Coreg to metoprolol for better rate control and then switching to Toprol-XL 200 mg p.o. daily.  Subacute hemorrhagic  -Neurology recommended on low-dose aspirin and high intensity  statin -Needs follow-up at lipid clinic. Seen by PT OT who recommended outpatient PT.  Seizures: Intermittent seizure activity confirmed on continuous EEG.  Likely due to CVA.  Neurology was managing.  He was started on Keppra initially low-dose and subsequently was increased to 1500 mg twice daily and Dilantin and Trileptal were added.  He is being discharged on all 3 per neurology recommendations.  He will follow-up with them.  Uncontrolled IDDM-2 with HHS and hyperlipidemia: A1c> 15.5%.    Had some labile blood sugar.  He was managed on following dosages here. Reduce Lantus to 20 units twice daily. -Continue NovoLog 6 units 3 times daily with meals SSI/moderate His metformin and glipizide was on hold.  He was seen by diabetes educator and they have recommended discharging him on 70/30 at 25 units twice daily and continuing metformin but reducing his glipizide from twice daily to once daily dosage.  Noncompliance -Counseled on this multiple times  Patient has been cleared by neurology as well as cardiology and he is being discharged home today.  Discharge Diagnoses:  Principal Problem:   Acute cerebrovascular accident (CVA) ([place]) Active Problems:   HTN (hypertension)   HLD (hyperlipidemia)   Type 2 diabetes mellitus ([place])   Hyperosmolar hyperglycemic state (HHS) ([place])   Neurological deficit present   Intracerebral hemorrhage   LV (left ventricular) mural thrombus   Seizure [facility])    Discharge Instructions  Discharge Instructions    Ambulatory referral to Occupational Therapy   Complete by: As directed    Ambulatory referral to Speech Therapy   Complete by: As directed      Allergies as of 09/19/2020   No Known Allergies     Medication List    STOP taking these medications   carvedilol 6.25 MG tablet Commonly known as: COREG   Entresto 49-51 MG Generic drug: sacubitril-valsartan   Fish Oil 1000 MG Cpdr   gabapentin 300 MG capsule Commonly known as:  NEURONTIN   ibuprofen 200 MG tablet Commonly known as: ADVIL   MAGNESIUM OXIDE PO   rosuvastatin 40 MG tablet Commonly known as: CRESTOR     TAKE these medications   acetaminophen 500 MG tablet Commonly known as: TYLENOL Take 1,500 mg by mouth every 6 (six) hours as needed for moderate pain or headache.   ascorbic acid 500 MG tablet Commonly known as: VITAMIN C Take 1,000 mg by mouth daily.   aspirin 81 MG EC tablet Take 1 tablet (81 mg total) by mouth daily. Swallow whole.   atorvastatin 80 MG tablet Commonly known as: LIPITOR Take 1 tablet (80 mg total) by mouth daily.   Blood Pressure Monitor Devi Use as directed to check home blood pressure 2-3 times a week   cholecalciferol 25 MCG (1000 UNIT) tablet Commonly known as: VITAMIN D Take 2,000 Units by mouth daily.   digoxin 0.125 MG tablet Commonly known as: LANOXIN Take 1 tablet (0.125 mg total) by mouth daily.   ezetimibe 10 MG tablet Commonly known as: ZETIA Take 1 tablet (10 mg total) by mouth daily.   furosemide 20 MG tablet Commonly known as: LASIX Take 1  tablet (20 mg total) by mouth daily.   glipiZIDE 10 MG tablet Commonly known as: GLUCOTROL Take 1 tablet (10 mg total) by mouth 2 (two) times daily before a meal. To lower blood sugar   glucose blood test strip Use as instructed   insulin detemir 100 UNIT/ML injection Commonly known as: LEVEMIR Inject 0.3 mLs (30 Units total) into the skin daily.   insulin lispro 100 UNIT/ML injection Commonly known as: HUMALOG inj 10-20 units three time a day with meals What changed:   how much to take  how to take this  when to take this  additional instructions   Insulin Syringe-Needle U-100 31G X 15/64" 0.3 ML Misc Commonly known as: ReliOn Insulin Syringe Use to inject insulin daily.   ivabradine 7.5 MG Tabs tablet Commonly known as: CORLANOR Take 1 tablet (7.5 mg total) by mouth 2 (two) times daily with a meal.   levETIRAcetam 750 MG  tablet Commonly known as: KEPPRA Take 2 tablets (1,500 mg total) by mouth 2 (two) times daily.   losartan 25 MG tablet Commonly known as: COZAAR Take 0.5 tablets (12.5 mg total) by mouth daily.   metFORMIN 500 MG tablet Commonly known as: GLUCOPHAGE Take 1 tablet (500 mg total) by mouth 2 (two) times daily with a meal. TAKE 1 TABLET BY MOUTH 2 TIMES DAILY WITH A MEAL What changed: additional instructions   metoprolol 200 MG 24 hr tablet Commonly known as: TOPROL-XL Take 1 tablet (200 mg total) by mouth daily. Take with or immediately following a meal.   nitroGLYCERIN 0.4 MG SL tablet Commonly known as: NITROSTAT Place 1 tablet (0.4 mg total) under the tongue every 5 (five) minutes as needed for chest pain.   oxcarbazepine 600 MG tablet Commonly known as: TRILEPTAL Take 1 tablet (600 mg total) by mouth 2 (two) times daily.   Pen Needles 31G X 8 MM Misc UAD   phenytoin 100 MG ER capsule Commonly known as: DILANTIN Take 1 capsule (100 mg total) by mouth 3 (three) times daily.   spironolactone 25 MG tablet Commonly known as: ALDACTONE Take 0.5 tablets (12.5 mg total) by mouth daily.   True Metrix Meter w/Device Kit 1 each by Does not apply route 3 (three) times daily.   warfarin 5 MG tablet Commonly known as: Coumadin Take 1 tablet (5 mg total) by mouth daily.   zinc gluconate 50 MG tablet Take 100 mg by mouth daily.       Follow-up Information    [facility].   Specialty: Rehabilitation Why: The outpatient rehab will contact you for the first appointment Contact information: [place] [facility] up.   Specialty: Cardiology Why: October 09, 2020 2:00 PM at the [facility] [address] Contact information: [address] [id number] [place]       Jason Pier, MD Follow up.   Specialty: Internal Medicine Why: Hospital follow up scheduled with your primary care doctor for Friday 10/27/2020 at 9:30am. If you need transportation call [employer] [phone] at least a day ahead to schedule for a ride. Contact information: [place] [address] [phone]        Josue Hector, MD Follow up in 1 week(s).   Specialty: Cardiology Contact information:  [place]. [place] [address] [phone]        [facility] Follow up in 1 week(s).   Specialty: Cardiology Contact information: [address], [place] [phone]             No Known Allergies  Consultations: Cardiology and neurology   Procedures/Studies: CT ANGIO HEAD NECK W WO CM  Result Date: 09/12/2020 CLINICAL DATA:  Stroke follow-up. EXAM: CT ANGIOGRAPHY HEAD AND NECK TECHNIQUE: Multidetector CT imaging of the head and neck was performed using the standard protocol during bolus administration of intravenous contrast. Multiplanar CT image reconstructions and MIPs were obtained to evaluate the vascular anatomy. Carotid stenosis measurements (when applicable) are obtained utilizing NASCET criteria, using the distal internal carotid diameter as the denominator. CONTRAST:  65m OMNIPAQUE IOHEXOL 350 MG/ML SOLN COMPARISON:  Head CT and MRI 09/11/2020 FINDINGS: CT HEAD FINDINGS Brain: Hypodensity in the right frontal operculum corresponds to the subacute infarct on MRI. The suspected small subacute left parieto-occipital infarct on MRI is not well demonstrated by CT. A chronic right parietal infarct is again noted. No acute intracranial hemorrhage, mass, midline shift, or extra-axial fluid collection is identified. The ventricles are normal in size. Vascular: No  hyperdense vessel. Skull: No fracture suspicious osseous lesion. Sinuses: Visualized paranasal sinuses and mastoid air cells are clear. Orbits: Visualized portions of the orbits are unremarkable. Review of the MIP images confirms the above findings CTA NECK FINDINGS Aortic arch: Standard 3 vessel aortic arch with widely patent arch vessel origins. Right carotid system: Patent without evidence of stenosis, dissection, or significant atherosclerosis. Left carotid system: Patent without evidence of stenosis, dissection, or significant atherosclerosis. Vertebral arteries: Patent without evidence of stenosis, dissection, or significant atherosclerosis. Strongly dominant right vertebral artery. Skeleton: No acute osseous abnormality or suspicious osseous lesion. Other neck: No evidence of cervical lymphadenopathy or mass. Upper chest: Clear lung apices. Review of the MIP images confirms the above findings CTA HEAD FINDINGS Anterior circulation: The internal carotid arteries are widely patent from skull base to carotid termini. ACAs and MCAs are patent without evidence of a proximal branch occlusion or significant proximal stenosis. There is mild attenuation of distal right MCA branch vessels in the region of the chronic right parietal infarct. No aneurysm is identified. Posterior circulation: The intracranial vertebral arteries are patent to the basilar with the left vertebral artery being markedly hypoplastic distal to the PICA origin. The left PICA and right AICA appear dominant. Patent SCAs are seen bilaterally. The basilar artery is widely patent. There are patent posterior communicating arteries bilaterally. Both PCAs are patent without evidence of a significant proximal stenosis. No aneurysm is identified. Venous sinuses: Patent. Anatomic variants: None. Review of the MIP images confirms the above findings IMPRESSION: No large vessel occlusion or significant proximal stenosis in the head and neck. Electronically  Signed   By: ALogan BoresM.D.   On: 09/12/2020 10:42   CT Head Wo Contrast  Result Date: 09/11/2020 CLINICAL DATA:  35year old male with seizure. EXAM: CT HEAD WITHOUT CONTRAST TECHNIQUE: Contiguous axial images were obtained from the base of the skull through the vertex without intravenous contrast. COMPARISON:  Head CT dated 11/15/2009. FINDINGS: Brain: Focal area of old infarct and encephalomalacia involving the right posterior parietal lobe. Additional area of infarct in the right frontal lobe may be subacute. Further evaluation with MRI is recommended. The ventricles and sulci appropriate size for patient's age. There is no  acute intracranial hemorrhage. No mass effect midline shift no extra-axial fluid collection. Vascular: No hyperdense vessel or unexpected calcification. Skull: Normal. Negative for fracture or focal lesion. Sinuses/Orbits: No acute finding. Other: None IMPRESSION: 1. No acute intracranial hemorrhage. 2. Probable subacute infarct in the right frontal lobe. Further evaluation with MRI is recommended. Old right parietal infarct. Electronically Signed   By: Anner Crete M.D.   On: 09/11/2020 21:44   MR BRAIN WO CONTRAST  Addendum Date: 09/18/2020   ADDENDUM REPORT: 09/18/2020 16:27 ADDENDUM: CORRECTION: The abnormal signal in conclusion #1 and the correlate findings is in the right frontal and left parieto-occipital lobes, not the left frontal lobe. Findings in the report and this addendum discussed with Desiree Metzger-Cihelka (NP) via telephone at 3:58 p.m. Electronically Signed   By: Margaretha Sheffield MD   On: 09/18/2020 16:27   Result Date: 09/18/2020 CLINICAL DATA:  Seizure, nontraumatic. EXAM: MRI HEAD WITHOUT CONTRAST TECHNIQUE: Multiplanar, multiecho pulse sequences of the brain and surrounding structures were obtained without intravenous contrast. COMPARISON:  Sep 11, 2020. FINDINGS: Brain: No substantial change in T2/FLAIR hyperintensity in the cortical and subcortical  right frontal and left frontal lobes, which demonstrated amorphous enhancement on the recent prior MRI. Faint susceptibility artifact in these regions likely represents petechial hemorrhage. Remote right parietal infarct with encephalomalacia and surrounding gliosis. Persistent mild DWI hyperintensity in the surrounding frontal cortex and right insula in regions of previously seen enhancement. Signal abnormality in the basal ganglia has improved slightly with persistent abnormal signal in the white matter tracts. No extra-axial fluid collection. No midline shift. Basal cisterns are patent. Vascular: Major arterial flow voids are maintained at the skull base. Small left vertebral artery. Skull and upper cervical spine: Normal marrow signal. Sinuses/Orbits: Clear sinuses.  Unremarkable orbits. Other: No mastoid effusions. IMPRESSION: 1. No substantial change in T2/FLAIR hyperintensity in the cortical and subcortical right frontal and left frontal lobes, which demonstrated amorphous enhancement on the recent prior MRI. In the setting of the patient's known cardiac risk factors, these areas most likely represent evolving subacute infarcts. There is evidence of petechial hemorrhage in these areas without mass occupying hemorrhagic transformation. Additional remote right parietal infarct is unchanged. 2. Persistent mild DWI hyperintensity in the surrounding right frontal cortex and right insula in regions of previously seen enhancement with faint associated edema. Abnormal signal in the basal ganglia and white matter tracks may be improved slightly, but is persistent. While nonspecific, findings are thought most likely to be the sequela of seizures given seizure activity on recent EEGs. Encephalitis and/or toxic/metabolic encephalopathy are differential considerations. 3. Recommend short interval follow-up MRI with contrast to ensure resolution of the above findings and exclude other etiologies. Electronically Signed: By:  Margaretha Sheffield MD On: 09/18/2020 15:28   MR Brain W and Wo Contrast  Result Date: 09/12/2020 CLINICAL DATA:  Initial evaluation for acute neuro deficit, altered mental status. EXAM: MRI HEAD WITHOUT AND WITH CONTRAST TECHNIQUE: Multiplanar, multiecho pulse sequences of the brain and surrounding structures were obtained without and with intravenous contrast. CONTRAST:  7.42m GADAVIST GADOBUTROL 1 MMOL/ML IV SOLN COMPARISON:  Prior head CT from earlier the same day. FINDINGS: Brain: Cerebral volume within normal limits for age. Area of encephalomalacia and gliosis involving the right parietal region consistent with a chronic ischemic infarct. Associated mild chronic hemosiderin staining at this location. There is an additional area of abnormal T2/FLAIR signal abnormality involving the cortical and subcortical aspect of the anterior right frontal lobe. Associated susceptibility artifact consistent  with blood products. No visible significant DWI signal. Area does demonstrate heterogeneous postcontrast gyriform diffusion abnormality within the adjacent right frontotemporal region with associated enhancement (series 20, image 6), favored to reflect changes related to superimposed seizure. Possible ischemic change could be conceivably be considered as well. There is an additional area of cortical signal abnormality involving the left parieto-occipital cortex with associated susceptibility artifact and enhancement, favored to reflect an additional subacute ischemic infarct (series 18, image 32). Note made of an additional probable tiny remote right cerebellar infarct (series 10, image 6). Additionally, there is abnormal symmetric increased T2/FLAIR signal abnormality involving the caudate and lentiform nuclei bilaterally (series 11, image 13). Finding is nonspecific, but it is most suggestive of superimposed/concomitant toxic metabolic derangement or other toxic insult. No associated enhancement or hemorrhage.  Suspected patchy involvement of the radiating deep white matter tracts (series 11, image 16). No other evidence for acute or subacute ischemia gray-white matter differentiation otherwise maintained. No other mass lesion, mass effect, or midline shift. No hydrocephalus or extra-axial fluid collection. Pituitary gland suprasellar region normal. Midline structures intact and normal. No other definite abnormal enhancement. Note made of an apparent small focus of enhancement within the right cerebellum on coronal postcontrast sequence (series 19, image 5), favored to be vascular in nature. Vascular: Major intracranial vascular flow voids are maintained. Skull and upper cervical spine: Craniocervical junction within normal limits. Bone marrow signal intensity normal. No scalp soft tissue abnormality. Sinuses/Orbits: Globes and orbital soft tissues within normal limits. Paranasal sinuses are clear. No mastoid effusion. Inner ear structures grossly normal. Other: None. IMPRESSION: 1. Probable subacute hemorrhagic infarcts involving the anterior right frontal lobe and left parieto-occipital cortex as above, with additional chronic right parietal infarct. 2. Superimposed gyriform diffusion abnormality and enhancement involving the right frontotemporal region, favored to reflect changes of acute seizure. Correlation with EEG recommended. Possible ischemic change would be the primary differential consideration. 3. Abnormal symmetric T2/FLAIR signal abnormality involving the caudate and lentiform nuclei bilaterally, nonspecific, but most suggestive of superimposed/concomitant toxic metabolic derangement or other toxic insult. Correlation with history and laboratory values recommended. 4. A short interval follow-up brain MRI to ensure that these changes resolve is recommended. Electronically Signed   By: Jeannine Boga M.D.   On: 09/12/2020 00:43   EEG adult  Result Date: 09/12/2020 Lora Havens, MD     09/12/2020   8:37 AM Patient Name: Jason Stevenson MRN: [id number] Epilepsy Attending: Lora Havens Referring Physician/Provider: Dr Derrick Ravel Date: 09/12/2020 Duration: 23.58 mins Patient history: 35 year old male presenting with new onset of left sided twitching, left facial droop and severe hyperglycemia. EEG to evaluate for seizure. Level of alertness: Awake, drowsy AEDs during EEG study: None Technical aspects: This EEG study was done with scalp electrodes positioned according to the 10-20 International system of electrode placement. Electrical activity was acquired at a sampling rate of _0  and reviewed with a high frequency filter of _1  and a low frequency filter of _2 . EEG data were recorded continuously and digitally stored. Description: The posterior dominant rhythm consists of 8-9 Hz activity of moderate voltage (25-35 uV) seen predominantly in posterior head regions, symmetric and reactive to eye opening and eye closing. Drowsiness was characterized by attenuation of the posterior background rhythm and roving eye movements.  Hyperventilation and photic stimulation were not performed.   IMPRESSION: This study is within normal limits. No seizures or epileptiform discharges were seen throughout the recording. [place]   Overnight EEG with  video  Result Date: 09/13/2020 Lora Havens, MD     09/14/2020  9:38 AM Patient Name: Jason Stevenson MRN: [id number] Epilepsy Attending: Lora Havens Referring Physician/Provider: Dr Derrick Ravel Duration: 09/12/2020 1901 to 09/13/2020 1901  Patient history: 35 year old male presenting with new onset of left sided twitching, left facial droop and severe hyperglycemia. EEG to evaluate for seizure.  Level of alertness: Awake, asleep  AEDs during EEG study: LEV, PHT  Technical aspects: This EEG study was done with scalp electrodes positioned according to the 10-20 International system of electrode placement. Electrical activity was acquired at  a sampling rate of _0  and reviewed with a high frequency filter of _1  and a low frequency filter of _2 . EEG data were recorded continuously and digitally stored.  Description: The posterior dominant rhythm consists of 8-9 Hz activity of moderate voltage (25-35 uV) seen predominantly in posterior head regions, symmetric and reactive to eye opening and eye closing.  Sleep was characterized by orbicularis, sleep spindles (12 to 14 Hz), maximum frontocentral region.  Intermittent sharply contoured 2 to 3 Hz delta slowing was also noted in left frontotemporal region. Seizures without clinical signs were seen arising from right temporo-parietal region, average 4-5/hour, average duration about 30 seconds.  After fosphenytoin was added, frequency of seizures improved, last seizure noted on 09/13/2020 at 1513. hyperventilation and photic stimulation were not performed.   ABNORMALITY -Seizure without clinical signs, right temporo-parietal region -Intermittent slow, left frontotemporal region  IMPRESSION: This study showed seizures without clinical signs arising from right temporo-parietal region, average 4-5/hour, average duration about 30 seconds.  Frequency of seizures improved after fosphenytoin was added, last seizure noted on  09/13/2020 at 1513. There is also evidence of nonspecific cortical dysfunction in left frontotemporal region. Dr. Cheral Marker and Dr. Erlinda Hong were intermittently notified  Lora Havens   ECHOCARDIOGRAM COMPLETE  Result Date: 09/13/2020    ECHOCARDIOGRAM REPORT   Patient Name:   Jason Stevenson Date of Exam: 09/13/2020 Medical Rec #:  [id number]         Height:       71.0 in Accession #:    [id number]        Weight:       167.0 lb Date of Birth:  Feb 26, 1986          BSA:          1.953 m Patient Age:    36 years          BP:           102/79 mmHg Patient Gender: M                 HR:           111 bpm. Exam Location:  Inpatient Procedure: 2D Echo, 3D Echo, Cardiac Doppler, Color Doppler and  Intracardiac            Opacification Agent REPORT CONTAINS CRITICAL RESULT Indications:    Stroke  History:        Patient has prior history of Echocardiogram examinations.                 Cardiomyopathy and CHF, Abnormal ECG, Stroke; Risk                 Factors:Hypertension, Diabetes and Dyslipidemia.  Sonographer:    Roseanna Rainbow RDCS Referring Phys: [id number] [facility]  Sonographer Comments: Technically difficult study due to poor echo windows. Patient supine on EEG monitor. Patient started to  have siezure like activity during image 104. RN notified. IMPRESSIONS  1. 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  2. Left ventricular ejection fraction, by estimation, is 10-15%. The left ventricle has severely decreased function. The left ventricle demonstrates global hypokinesis. The left ventricular internal cavity size was mildly dilated. Left ventricular diastolic parameters are indeterminate.  3. Right ventricular systolic function is moderately reduced. The right ventricular size is normal. Tricuspid regurgitation signal is inadequate for assessing PA pressure.  4. Left atrial size was mildly dilated.  5. The mitral valve is grossly normal. Mild mitral valve regurgitation. No evidence of mitral stenosis.  6. The aortic valve is tricuspid. Aortic valve regurgitation is not visualized. No aortic stenosis is present.  7. The inferior vena cava is dilated in size with <50% respiratory variability, suggesting right atrial pressure of 15 mmHg. Conclusion(s)/Recommendation(s): Critical findings reported to Dr. Ander Purpura and acknowledged at 9:15 am 09/13/20. FINDINGS  Left Ventricle: Left ventricular ejection fraction, by estimation, is 10-15%. The left ventricle has severely decreased function. The left ventricle demonstrates global hypokinesis. Definity contrast agent was given IV to delineate the left ventricular endocardial borders. The left ventricular internal cavity size was mildly dilated. There is no left  ventricular hypertrophy. Left ventricular diastolic parameters are indeterminate. Right Ventricle: The right ventricular size is normal. No increase in right ventricular wall thickness. Right ventricular systolic function is moderately reduced. Tricuspid regurgitation signal is inadequate for assessing PA pressure. Left Atrium: Left atrial size was mildly dilated. Right Atrium: Right atrial size was normal in size. Pericardium: There is no evidence of pericardial effusion. Mitral Valve: The mitral valve is grossly normal. Mild mitral valve regurgitation. No evidence of mitral valve stenosis. Tricuspid Valve: The tricuspid valve is normal in structure. Tricuspid valve regurgitation is trivial. No evidence of tricuspid stenosis. Aortic Valve: The aortic valve is tricuspid. Aortic valve regurgitation is not visualized. No aortic stenosis is present. Pulmonic Valve: The pulmonic valve was normal in structure. Pulmonic valve regurgitation is not visualized. No evidence of pulmonic stenosis. Aorta: The aortic root is normal in size and structure. Venous: The inferior vena cava is dilated in size with less than 50% respiratory variability, suggesting right atrial pressure of 15 mmHg. IAS/Shunts: No atrial level shunt detected by color flow Doppler.  LEFT VENTRICLE PLAX 2D LVIDd:         6.00 cm LVIDs:         5.50 cm LV PW:         0.90 cm LV IVS:        0.90 cm LVOT diam:     1.90 cm LV SV:         30 LV SV Index:   16 LVOT Area:     2.84 cm  LV Volumes (MOD) LV vol d, MOD A2C: 83.7 ml LV vol d, MOD A4C: 202.0 ml LV vol s, MOD A2C: 68.0 ml LV vol s, MOD A4C: 187.0 ml LV SV MOD A2C:     15.7 ml LV SV MOD A4C:     202.0 ml LV SV MOD BP:      18.0 ml RIGHT VENTRICLE            IVC RV S prime:     7.67 cm/s  IVC diam: 2.05 cm TAPSE (M-mode): 1.7 cm LEFT ATRIUM             Index       RIGHT ATRIUM  Index LA diam:        3.00 cm 1.54 cm/m  RA Area:     12.90 cm LA Vol (A2C):   30.6 ml 15.67 ml/m RA Volume:   32.10  ml  16.44 ml/m LA Vol (A4C):   48.0 ml 24.58 ml/m LA Biplane Vol: 40.8 ml 20.89 ml/m  AORTIC VALVE LVOT Vmax:   79.90 cm/s LVOT Vmean:  55.800 cm/s LVOT VTI:    0.107 m  AORTA Ao Root diam: 3.00 cm MITRAL VALVE MV Area (PHT): 5.60 cm     SHUNTS MV Decel Time: 136 msec     Systemic VTI:  0.11 m MV E velocity: 125.00 cm/s  Systemic Diam: 1.90 cm Cherlynn Kaiser MD Electronically signed by Cherlynn Kaiser MD Signature Date/Time: 09/13/2020/9:15:56 AM    Final       Discharge Exam: Vitals:   09/19/20 0402 09/19/20 0732  BP: 110/85 107/62  Pulse: 98 (!) 109  Resp: 11 17  Temp: 98.8 F (37.1 C) 98.4 F (36.9 C)  SpO2: 99% 100%   Vitals:   09/18/20 2026 09/18/20 2318 09/19/20 0402 09/19/20 0732  BP: 91/74 103/84 110/85 107/62  Pulse: (!) 110 (!) 106 98 (!) 109  Resp: 15 (!) _0 Temp: 99.1 F (37.3 C) 98.5 F (36.9 C) 98.8 F (37.1 C) 98.4 F (36.9 C)  TempSrc: Oral Oral Oral Oral  SpO2: 99% 100% 99% 100%  Weight:      Height:        General: Pt is alert, awake, not in acute distress Cardiovascular: RRR, S1/S2 +, no rubs, no gallops Respiratory: CTA bilaterally, no wheezing, no rhonchi Abdominal: Soft, NT, ND, bowel sounds + Extremities: no edema, no cyanosis    The results of significant diagnostics from this hospitalization (including imaging, microbiology, ancillary and laboratory) are listed below for reference.     Microbiology: Recent Results (from the past 240 hour(s))  SARS CORONAVIRUS 2 (TAT 6-24 HRS) Nasopharyngeal Nasopharyngeal Swab     Status: None   Collection Time: 09/12/20 12:57 AM   Specimen: Nasopharyngeal Swab  Result Value Ref Range Status   SARS Coronavirus 2 NEGATIVE NEGATIVE Final    Comment: (NOTE) SARS-CoV-2 target nucleic acids are NOT DETECTED.  The SARS-CoV-2 RNA is generally detectable in upper and lower respiratory specimens during the acute phase of infection. Negative results do not preclude SARS-CoV-2 infection, do not rule  out co-infections with other pathogens, and should not be used as the sole basis for treatment or other patient management decisions. Negative results must be combined with clinical observations, patient history, and epidemiological information. The expected result is Negative.  Fact Sheet for Patients: SugarRoll.be  Fact Sheet for Healthcare Providers: https://www.woods-mathews.com/  This test is not yet approved or cleared by the [place] FDA and  has been authorized for detection and/or diagnosis of SARS-CoV-2 by FDA under an Emergency Use Authorization (EUA). This EUA will remain  in effect (meaning this test can be used) for the duration of the COVID-19 declaration under Se ction 564(b)(1) of the Act, 21 U.S.C. section 360bbb-3(b)(1), unless the authorization is terminated or revoked sooner.  Performed at [facility], [place] [address], [place], [place] [address]      Labs: BNP (last 3 results) No results for input(s): BNP in the last 8760 hours. Basic Metabolic Panel: Recent Labs  Lab 09/13/20 0255 09/14/20 0650 09/15/20 0008 09/16/20 0345 09/17/20 0157 09/18/20 0521 09/19/20 0523  NA 132*   < >  134* 136 142 139 138  K 4.0   < > 3.8 3.7 4.4 3.7 3.9  CL 94*   < > 97* 100 103 103 101  CO2 25   < > _0 GLUCOSE 186*   < > 106* 81 60* 50* 103*  BUN 15   < > _1 CREATININE 0.87   < > 0.72 0.87 1.01 0.88 0.93  CALCIUM 9.3   < > 8.8* 9.0 9.3 8.7* 8.7*  MG 1.8   < > 1.9 1.9 2.2 1.8 1.9  PHOS 4.3  --   --   --   --   --   --    < > = values in this interval not displayed.   Liver Function Tests: Recent Labs  Lab 09/13/20 0255  ALBUMIN 3.1*   No results for input(s): LIPASE, AMYLASE in the last 168 hours. No results for input(s): AMMONIA in the last 168 hours. CBC: Recent Labs  Lab 09/15/20 0008 09/16/20 0345 09/17/20 0157 09/18/20 0521 09/19/20 0523  WBC 5.8 6.5 6.9 7.3 6.2  HGB  13.2 13.7 13.2 13.0 12.1*  HCT 39.8 40.7 40.5 38.7* 36.4*  MCV 85.0 85.0 88.4 85.6 86.7  PLT 412* 319 395 313 308   Cardiac Enzymes: No results for input(s): CKTOTAL, CKMB, CKMBINDEX, TROPONINI in the last 168 hours. BNP: Invalid input(s): POCBNP CBG: Recent Labs  Lab 09/18/20 0628 09/18/20 1206 09/18/20 1611 09/18/20 2142 09/19/20 0638  GLUCAP 82 128* 161* 131* 80   D-Dimer No results for input(s): DDIMER in the last 72 hours. Hgb A1c No results for input(s): HGBA1C in the last 72 hours. Lipid Profile No results for input(s): CHOL, HDL, LDLCALC, TRIG, CHOLHDL, LDLDIRECT in the last 72 hours. Thyroid function studies No results for input(s): TSH, T4TOTAL, T3FREE, THYROIDAB in the last 72 hours.  Invalid input(s): FREET3 Anemia work up No results for input(s): VITAMINB12, FOLATE, FERRITIN, TIBC, IRON, RETICCTPCT in the last 72 hours. Urinalysis    Component Value Date/Time   COLORURINE STRAW (A) 09/11/2020 2014   APPEARANCEUR CLEAR 09/11/2020 2014   LABSPEC 1.031 (H) 09/11/2020 2014   PHURINE 5.0 09/11/2020 2014   GLUCOSEU >=500 (A) 09/11/2020 2014   HGBUR NEGATIVE 09/11/2020 2014   BILIRUBINUR NEGATIVE 09/11/2020 2014   BILIRUBINUR neg 04/17/2016 1134   KETONESUR 5 (A) 09/11/2020 2014   PROTEINUR NEGATIVE 09/11/2020 2014   UROBILINOGEN 1.0 04/17/2016 1134   UROBILINOGEN 1.0 11/05/2012 1608   NITRITE NEGATIVE 09/11/2020 2014   LEUKOCYTESUR NEGATIVE 09/11/2020 2014   Sepsis Labs Invalid input(s): PROCALCITONIN,  WBC,  LACTICIDVEN Microbiology Recent Results (from the past 240 hour(s))  SARS CORONAVIRUS 2 (TAT 6-24 HRS) Nasopharyngeal Nasopharyngeal Swab     Status: None   Collection Time: 09/12/20 12:57 AM   Specimen: Nasopharyngeal Swab  Result Value Ref Range Status   SARS Coronavirus 2 NEGATIVE NEGATIVE Final    Comment: (NOTE) SARS-CoV-2 target nucleic acids are NOT DETECTED.  The SARS-CoV-2 RNA is generally detectable in upper and lower respiratory  specimens during the acute phase of infection. Negative results do not preclude SARS-CoV-2 infection, do not rule out co-infections with other pathogens, and should not be used as the sole basis for treatment or other patient management decisions. Negative results must be combined with clinical observations, patient history, and epidemiological information. The expected result is Negative.  Fact Sheet for Patients: SugarRoll.be  Fact Sheet for Healthcare Providers: https://www.woods-mathews.com/  This test is not yet  approved or cleared by the [place] and  has been authorized for detection and/or diagnosis of SARS-CoV-2 by FDA under an Emergency Use Authorization (EUA). This EUA will remain  in effect (meaning this test can be used) for the duration of the COVID-19 declaration under Se ction 564(b)(1) of the Act, 21 U.S.C. section 360bbb-3(b)(1), unless the authorization is terminated or revoked sooner.  Performed at [facility], [place] [address] [place], [place] [address]      Time coordinating discharge: Over 30 minutes  SIGNED:   Darliss Cheney, MD  [facility] 09/19/2020, 10:38 AM  If 7PM-7AM, please contact night-coverage [email or web address]

## 2020-09-19 NOTE — Plan of Care (Signed)
  Problem: Education: Goal: Knowledge of secondary prevention will improve Outcome: Progressing Goal: Knowledge of patient specific risk factors addressed and post discharge goals established will improve Outcome: Progressing Goal: Individualized Educational Video(s) Outcome: Progressing   Problem: Health Behavior/Discharge Planning: Goal: Ability to manage health-related needs will improve Outcome: Progressing   Problem: Ischemic Stroke/TIA Tissue Perfusion: Goal: Complications of ischemic stroke/TIA will be minimized Outcome: Progressing

## 2020-09-19 NOTE — TOC Transition Note (Addendum)
Transition of Care [facility] - CM/SW Discharge Note Heart Failure   Patient Details  Name: Jason Stevenson MRN: [id number] Date of Birth: [birth year]  [facility] CM/SW Contact:  Phillipa Morden, LCSWA Phone Number: 09/19/2020, 10:57 AM   Clinical Narrative:    CSW spoke with the patient and his wife at bedside to bring the patient an appointment card for the [facility] outpatient clinic and encouraged him to follow up and to attend the appointment and bring his medications and if anything changes to please reach out so that CSW/HV clinic team can provide support. CSW scheduled the patient a hospital follow up with their primary care doctor at [facility] for 10/27/20 at 9:30am and provided the patient and his wife with that information as well.  CSW will sign off for now as social work intervention is no longer needed. Please consult [place] again if new needs arise.  Final next level of care: Home/Self Care Barriers to Discharge: No Barriers Identified   Patient Goals and CMS Choice        Discharge Placement                       Discharge Plan and Services In-house Referral: Clinical Social Work                                   Social Determinants of Health (SDOH) Interventions Food Insecurity Interventions: Assist with SNAP Application Financial Strain Interventions: Other (Comment) (Provided patient with a cafa application) Housing Interventions: Intervention Not Indicated Transportation Interventions: [facility]   Readmission Risk Interventions Readmission Risk Prevention Plan 09/19/2020  Post Dischage Appt Complete  Transportation Screening Complete  Some recent data might be hidden   Miley Blanchett, MSW, LCSWA [phone] Heart Failure Social Worker

## 2020-09-19 NOTE — Progress Notes (Signed)
ANTICOAGULATION CONSULT NOTE - Follow Up Consult  Pharmacy Consult for Warfarin  Indication: LV Thrombus  No Known Allergies  Patient Measurements: Height: 5\' 11"  (180.3 cm) Weight: 64.5 kg (142 lb 4.8 oz) IBW/kg (Calculated) : 75.3 Heparin Dosing Weight: 64.5 kg  Vital Signs: Temp: 98.4 F (36.9 C) (06/07 0732) Temp Source: Oral (06/07 0732) BP: 107/62 (06/07 0732) Pulse Rate: 109 (06/07 0732)  Labs: Recent Labs    09/17/20 0157 09/18/20 0521 09/19/20 0523  HGB 13.2 13.0 12.1*  HCT 40.5 38.7* 36.4*  PLT 395 313 308  LABPROT 24.3* 29.2* 26.8*  INR 2.2* 2.8* 2.5*  CREATININE 1.01 0.88 0.93    Estimated Creatinine Clearance: 99.2 mL/min (by C-G formula based on SCr of 0.93 mg/dL).   Medical History: Past Medical History:  Diagnosis Date  . Chronic systolic CHF (congestive heart failure) (HCC) 06/18/2015  . Diabetes mellitus without complication (HCC)   . Hyperlipidemia   . Hypertension   . Nonischemic cardiomyopathy (HCC) Noted as early as 2007   Per chart review (cards consult note 2011), EF of 40% in 2007, down to 20-25% in 2011    Assessment: 35 yr old man presented with acute neurologic deficits and dysarthia - found to have seizures. MRI showed probable subacute hemorrhagic infarcts involving anterior R frontal lobe and L parieto-occipital cortex. Pharmacy was consulted warfarin and heparin (pt was on no anticoagulants PTA).  ECHO showed LV thrombus. Discussed with HF team and neurology, MRI findings likely chronic in nature and okay to proceed with anticoagulation for LV thrombus.   Of note, patient is receiving phenytoin which can impact INR.    INR very labile but stabilizing today. Continues to be at goal at 2.5 today. No bleeding issues noted, cbc stable.   Goal of Therapy:  INR 2-3 Monitor platelets by anticoagulation protocol: Yes   Plan:  If discharging today would send home on 5mg  daily with INR later this week or early next week if  possible  30 PharmD., BCPS Clinical Pharmacist 09/19/2020 10:33 AM

## 2020-09-19 NOTE — TOC Progression Note (Signed)
Transition of Care (TOC) - Progression Note  Heart Failure   Patient Details  Name: Jason Stevenson MRN: [id number] Date of Birth: 01-10-1986  [facility] CM/SW Contact  Khylei Wilms, LCSWA Phone Number: 09/19/2020, 9:49 AM  Clinical Narrative:    CSW reached out to the diabetic coordinator, RN, Julien Girt on behalf of the patient and his wife who were asking about an insulin pump and glucometer. The diabetic coordinator reported that she can help with the glucometer but it looked like in her colleagues notes the patient may have one. She indicated the patient will need to follow up with his primary care for 70/30 as this will be the lowest cost for him and that an insulin pump wouldn't be appropriate since the patient is unable to afford one at this time.    CSW will continue to follow throughout discharge.   Expected Discharge Plan: Home/Self Care Barriers to Discharge: Continued Medical Work up  Expected Discharge Plan and Services Expected Discharge Plan: Home/Self Care In-house Referral: Clinical Social Work     Living arrangements for the past 2 months: Single Family Home Expected Discharge Date: 09/19/20                                     Social Determinants of Health (SDOH) Interventions Food Insecurity Interventions: Assist with [employer] Application Financial Strain Interventions: Other (Comment) (Provided patient with a cafa application) Housing Interventions: Intervention Not Indicated Transportation Interventions: [facility]  Readmission Risk Interventions No flowsheet data found.  Margrett Kalb, MSW, LCSWA [phone] Heart Failure Social Worker

## 2020-09-19 NOTE — Progress Notes (Addendum)
Advanced Heart Failure Rounding Note  PCP-Cardiologist: Charlton Haws, MD  [facility]: Dr. Shirlee Latch   Subjective:    INR therapeutic at 2.5   Volume status ok on exam. No dyspnea. Remains in ST,  HR 110s. SBPs 90s-low 100s.   Currently working w/ PT. Planning d/c home today.    Objective:   Weight Range: 64.5 kg Body mass index is 19.85 kg/m.   Vital Signs:   Temp:  [98 F (36.7 C)-99.1 F (37.3 C)] 98.8 F (37.1 C) (06/07 0402) Pulse Rate:  [98-115] 98 (06/07 0402) Resp:  [11-26] 11 (06/07 0402) BP: (91-111)/(74-90) 110/85 (06/07 0402) SpO2:  [99 %-100 %] 99 % (06/07 0402) Last BM Date: 09/15/20  Weight change: Filed Weights   09/13/20 1100  Weight: 64.5 kg    Intake/Output:   Intake/Output Summary (Last 24 hours) at 09/19/2020 0728 Last data filed at 09/19/2020 0352 Gross per 24 hour  Intake --  Output 500 ml  Net -500 ml      Physical Exam   PHYSICAL EXAM: General:  Well appearing thin young AAM. No respiratory difficulty HEENT: normal Neck: supple. no JVD. Carotids 2+ bilat; no bruits. No lymphadenopathy or thyromegaly appreciated. Cor: PMI nondisplaced. Regular rhythm, tachy rate. No rubs, gallops or murmurs. Lungs: clear Abdomen: soft, nontender, nondistended. No hepatosplenomegaly. No bruits or masses. Good bowel sounds. Extremities: no cyanosis, clubbing, rash, edema Neuro: alert & oriented x 3, cranial nerves grossly intact. moves all 4 extremities w/o difficulty. Affect pleasant.   Telemetry   Sinus Tach 110s   EKG    No new EKG to review   Labs    CBC Recent Labs    09/18/20 0521 09/19/20 0523  WBC 7.3 6.2  HGB 13.0 12.1*  HCT 38.7* 36.4*  MCV 85.6 86.7  PLT 313 308   Basic Metabolic Panel Recent Labs    94/49/67 0521 09/19/20 0523  NA 139 138  K 3.7 3.9  CL 103 101  CO2 27 27  GLUCOSE 50* 103*  BUN 10 16  CREATININE 0.88 0.93  CALCIUM 8.7* 8.7*  MG 1.8 1.9   Liver Function Tests No results for input(s): AST, ALT,  ALKPHOS, BILITOT, PROT, ALBUMIN in the last 72 hours. No results for input(s): LIPASE, AMYLASE in the last 72 hours. Cardiac Enzymes No results for input(s): CKTOTAL, CKMB, CKMBINDEX, TROPONINI in the last 72 hours.  BNP: BNP (last 3 results) No results for input(s): BNP in the last 8760 hours.  ProBNP (last 3 results) No results for input(s): PROBNP in the last 8760 hours.   D-Dimer No results for input(s): DDIMER in the last 72 hours. Hemoglobin A1C No results for input(s): HGBA1C in the last 72 hours. Fasting Lipid Panel No results for input(s): CHOL, HDL, LDLCALC, TRIG, CHOLHDL, LDLDIRECT in the last 72 hours. Thyroid Function Tests No results for input(s): TSH, T4TOTAL, T3FREE, THYROIDAB in the last 72 hours.  Invalid input(s): FREET3  Other results:   Imaging    MR BRAIN WO CONTRAST  Addendum Date: 09/18/2020   ADDENDUM REPORT: 09/18/2020 16:27 ADDENDUM: CORRECTION: The abnormal signal in conclusion #1 and the correlate findings is in the right frontal and left parieto-occipital lobes, not the left frontal lobe. Findings in the report and this addendum discussed with Desiree Metzger-Cihelka (NP) via telephone at 3:58 p.m. Electronically Signed   By: Feliberto Harts MD   On: 09/18/2020 16:27   Result Date: 09/18/2020 CLINICAL DATA:  Seizure, nontraumatic. EXAM: MRI HEAD WITHOUT CONTRAST TECHNIQUE:  Multiplanar, multiecho pulse sequences of the brain and surrounding structures were obtained without intravenous contrast. COMPARISON:  Sep 11, 2020. FINDINGS: Brain: No substantial change in T2/FLAIR hyperintensity in the cortical and subcortical right frontal and left frontal lobes, which demonstrated amorphous enhancement on the recent prior MRI. Faint susceptibility artifact in these regions likely represents petechial hemorrhage. Remote right parietal infarct with encephalomalacia and surrounding gliosis. Persistent mild DWI hyperintensity in the surrounding frontal cortex and  right insula in regions of previously seen enhancement. Signal abnormality in the basal ganglia has improved slightly with persistent abnormal signal in the white matter tracts. No extra-axial fluid collection. No midline shift. Basal cisterns are patent. Vascular: Major arterial flow voids are maintained at the skull base. Small left vertebral artery. Skull and upper cervical spine: Normal marrow signal. Sinuses/Orbits: Clear sinuses.  Unremarkable orbits. Other: No mastoid effusions. IMPRESSION: 1. No substantial change in T2/FLAIR hyperintensity in the cortical and subcortical right frontal and left frontal lobes, which demonstrated amorphous enhancement on the recent prior MRI. In the setting of the patient's known cardiac risk factors, these areas most likely represent evolving subacute infarcts. There is evidence of petechial hemorrhage in these areas without mass occupying hemorrhagic transformation. Additional remote right parietal infarct is unchanged. 2. Persistent mild DWI hyperintensity in the surrounding right frontal cortex and right insula in regions of previously seen enhancement with faint associated edema. Abnormal signal in the basal ganglia and white matter tracks may be improved slightly, but is persistent. While nonspecific, findings are thought most likely to be the sequela of seizures given seizure activity on recent EEGs. Encephalitis and/or toxic/metabolic encephalopathy are differential considerations. 3. Recommend short interval follow-up MRI with contrast to ensure resolution of the above findings and exclude other etiologies. Electronically Signed: By: Feliberto Harts MD On: 09/18/2020 15:28     Medications:     Scheduled Medications: .  stroke: mapping our early stages of recovery book   Does not apply Once  . aspirin EC  81 mg Oral Daily  . atorvastatin  80 mg Oral Daily  . digoxin  0.125 mg Oral Daily  . ezetimibe  10 mg Oral Daily  . furosemide  20 mg Oral Daily  .  insulin aspart  0-15 Units Subcutaneous TID WC  . insulin aspart  0-5 Units Subcutaneous QHS  . insulin aspart  6 Units Subcutaneous TID WC  . insulin detemir  20 Units Subcutaneous BID  . ivabradine  7.5 mg Oral BID WC  . levETIRAcetam  1,500 mg Oral BID  . losartan  12.5 mg Oral Daily  . metoprolol succinate  200 mg Oral Daily  . OXcarbazepine  600 mg Oral BID  . phenytoin  100 mg Oral TID  . spironolactone  12.5 mg Oral Daily  . Warfarin - Pharmacist Dosing Inpatient   Does not apply q1600    Infusions:   PRN Medications: acetaminophen **OR** acetaminophen (TYLENOL) oral liquid 160 mg/5 mL **OR** acetaminophen, dextrose, ondansetron (ZOFRAN) IV     Assessment/Plan   1. Chronic systolic CHF: Nonischemic cardiomyopathy known for years.  Normal coronary CTA in 2014.  Echo this admission with EF 10-15%, LV thrombus, moderate RV dysfunction, dilated IVC. With markedly high LDL and poorly controlled glucose, concern for development of CAD though no chest pain.  On exam, he does not look volume overloaded though IVC dilated on echo.  Clinically, does not have low output HF.  Symptomatically feels well. Continue titration of GDMT  - Continue Toprol XL  200 mg daily  - Continue Ivabradine to  7.5 mg bid - Continue digoxin 0.125  - Continue Spironolactone 12.5 daily.  - Continue Lasix 20 mg daily.  - Continue Losartan 12.5 daily  - Renal function stable  - Hgb A1c too high for SGLT2i (>15.5)  - Consider right/left cath in future given risk factors though coronary CTA was negative in the past.  -Would hold off for now given need for anticoagulation and recent CVA.  - Would involve paramedicine at discharge. Referral order sent  2. LV thrombus: With associated CVA.  - Restarted anticoagulation.    - INR 2.5 today  3. 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  Still with some dysarthria.  - Anticoagulation ok per neurology.  - Atorvastatin.  4. Seizures:  Likely related to CVA.  Now on Keppra.  EEG underway.  5.  Hyperlipidemia: Markedly high LDL.   - Continue atorvastatin 80 mg daily.  - Zetia 10 added  - F/u LP 608 weeks. Lipid Clinic referral if LDL not at goal after improved compliance  6.  Diabetes: Poor control. Hgb A1c >15.5  - insulin per primary team.    HFSW --following--> CSW obtained the patients signature for the disability and Food Stamp referral for the [facility] to reach out to them and patient and spouse are both in agreement to move forward with referrals. No medical insurance.   Ok for d/c home from HF standpoint.   Cardiac Meds for d/c Atorvastatin 80 mg daily  Digoxin 0.125 mg daily  Zetia 10 mg daily  Lasix 20 mg daily  Ivabradine 7.5 mg bid  Losartan 12.5 mg daily  Spironolactone 12.5 mg daily  Toprol XL 200 mg daily Coumadin (dosing per pharmD)   We will arrange post hospital f/u in the [facility] and will place appt info in AVS. He has been referred to community paramedicine program to help w/ med compliance.    Length of Stay: [address], PA-C  09/19/2020, 7:28 AM  Advanced Heart Failure Team Pager [phone] (M-F; 7a - 5p)  Please contact [facility] for night-coverage after hours (5p -7a ) and weekends on [email or web address]  Patient seen with PA, agree with the above note.    Patient walked in hall today without dyspnea.  No complaints, INR 2.5 and creatinine stable.    I think he is stable to go home.  Will need followup in coumadin clinic and will need followup in CHF clinic.  Talked to him about importance of keeping followup and taking all his meds.  Send home on the above listed cardiac medications.   Marca Ancona 09/19/2020 9:53 AM

## 2020-09-19 NOTE — Progress Notes (Signed)
Inpatient Diabetes Program Recommendations  AACE/ADA: New Consensus Statement on Inpatient Glycemic Control (2015)  Target Ranges:  Prepandial:   less than 140 mg/dL      Peak postprandial:   less than 180 mg/dL (1-2 hours)      Critically ill patients:  140 - 180 mg/dL   Lab Results  Component Value Date   GLUCAP 80 09/19/2020   HGBA1C >15.5 (H) 09/12/2020    Review of Glycemic Control Results for Jason Stevenson, Jason Stevenson (MRN [id number]) as of 09/19/2020 10:38  Ref. Range 09/18/2020 16:11 09/18/2020 21:42 09/19/2020 06:38  Glucose-Capillary Latest Ref Range: 70 - 99 mg/dL 478 (H) 295 (H) 80   Diabetes history: Type 2 DM Outpatient Diabetes medications: Levemir 30 units daily Humalog 10-20 units TID  Glipizide 10 mg BID Metformin 500 mg BID Current orders for Inpatient glycemic control: Levemir 20 units BID Novolog 0-15 units TID &HS Novolog 6 units TID  Inpatient Diabetes Program Recommendations:    Spoke with patient at length to review and reinforce previous DM coordinator conversation from 5/31. Patient requesting glucometer and insulin pump.  Reviewed patient's current A1c of >15.5%. Explained what a A1c is and what it measures. Also reviewed goal A1c with patient, importance of good glucose control @ home, and blood sugar goals. Reviewed patho of DM, role of pancreas, need for insulin, impact of weight loss and poor glycemic control, vascular changes, and comorbidities.  Patient requesting meter and reports not having a working one at home. Meter provided. Encouraged to check CBGs atleast 2-3 times per day and reviewed frequency. Reviewed when to call MD, survival skills and interventions. Patient will have an appointment at [facility] for follow up at [facility].  Discussed Novolog 70/30, dosages, action time, and importance of eating with injecitons. Patient in agreement to perform BID injections. Feel this will be an easier regimen, given noncompliance and patient overwhelmed by MDIs.  Reviewed  plate method, carb counting, carb alottment per meals, and importance of eliminating sugary beverages.  Patient has no further questions at this time. Secure chat sent to MD.   At discharge, recommend Novolog 70/30 25 units BID (#[id number]) and insulin pen needles (#[id number]).   Thanks, Lujean Rave, MSN, RNC-OB Diabetes Coordinator [phone] (8a-5p)

## 2020-09-20 ENCOUNTER — Telehealth: Payer: Self-pay

## 2020-09-20 ENCOUNTER — Ambulatory Visit: Payer: Self-pay

## 2020-09-20 ENCOUNTER — Telehealth (HOSPITAL_COMMUNITY): Payer: Self-pay

## 2020-09-20 ENCOUNTER — Other Ambulatory Visit (HOSPITAL_COMMUNITY): Payer: Self-pay | Admitting: *Deleted

## 2020-09-20 DIAGNOSIS — Z5181 Encounter for therapeutic drug level monitoring: Secondary | ICD-10-CM | POA: Insufficient documentation

## 2020-09-20 MED ORDER — NOVOLIN 70/30 FLEXPEN (70-30) 100 UNIT/ML ~~LOC~~ SUPN: 22.0000 [IU] | PEN_INJECTOR | Freq: Two times a day (BID) | SUBCUTANEOUS | 0 refills | Status: DC

## 2020-09-20 NOTE — Telephone Encounter (Signed)
Luke may you please follow up with pt  

## 2020-09-20 NOTE — Telephone Encounter (Signed)
Pt. Came home from hospital yesterday. Reports Novolin 70/30 25 units in a.m. and p.m. added to his regimen. At 3:00 BS 75. 30 minutes later 63. Had OJ. Wife giving pt. Peanut butter crackers. BS now 91. Pt. Felt "a little jittery." Spoke with [place] in the practice. Will send triage over for advise . Answer Assessment - Initial Assessment Questions 1. SYMPTOMS: "What symptoms are you concerned about?"     Low blood sugar 2. ONSET:  "When did the symptoms start?"     Today 3. BLOOD GLUCOSE: "What is your blood glucose level?"      75  And 63 4. USUAL RANGE: "What is your blood glucose level usually?" (e.g., usual fasting morning value, usual evening value)     105-160 5. TYPE 1 or 2:  "Do you know what type of diabetes you have?"  (e.g., Type 1, Type 2, Gestational; doesn't know)      Type 1  6. INSULIN: "Do you take insulin?" "What type of insulin(s) do you use? What is the mode of delivery? (syringe, pen; injection or pump) "When did you last give yourself an insulin dose?" (i.e., time or hours/minutes ago) "How much did you give?" (i.e., how many units)     Yes - new insulin Novolin 70/30 7. DIABETES PILLS: "Do you take any pills for your diabetes?"     Yes 8. OTHER SYMPTOMS: "Do you have any symptoms?" (e.g., fever, frequent urination, difficulty breathing, vomiting)     Feels jittery 9. LOW BLOOD GLUCOSE TREATMENT: "What have you done so far to treat the low blood glucose level?"     Had OJ and having peanut butter crackers 10. FOOD: "When did you last eat or drink?"       Eating now 11. ALONE: "Are you alone right now or is someone with you?"        Wife with pt. 12. PREGNANCY: "Is there any chance you are pregnant?" "When was your last menstrual period?"       n/a  Protocols used: DIABETES - LOW BLOOD SUGAR-A-AH

## 2020-09-20 NOTE — Telephone Encounter (Signed)
Spoke with Erskine Squibb about this patient and he has been advised to decrease Novolin 70/30 to 22 units twice daily due to hypoglycemia and call back with the blood sugars tomorrow.

## 2020-09-20 NOTE — Telephone Encounter (Signed)
Dr. Laural Benes are you okay with Franky Macho calling the patient and instructing him to decrease insulin

## 2020-09-20 NOTE — Telephone Encounter (Signed)
Transition Care Management Follow-up Telephone Call  Call was completed with both the patient and his wife.   Date of discharge and from where: 09/19/2020, [facility]   How have you been since you were released from the hospital? He said so far he is feeling good.   Any questions or concerns? Yes   He was discharged on warfarin but no orders for INR check. He was planning to wait until he is seen at cardiology at the end of this month.   This CM contacted Rosetta Posner, LCSW /Heart Failure clinic and informed her of the need for INR check.  She then contacted the coumadin clinic who scheduled patient for INR check - 09/26/2020. This CM called patient and wife back later and they acknowledged that he was contacted about the INR check appointment   Question regarding humalog noted below.   Items Reviewed:  Did the pt receive and understand the discharge instructions provided? Yes   Medications obtained and verified? They said that he has all medications except for the humalog.  They both explained that they were told by  the hospital staff that they wanted to make his insulin regime easier for him to manage so he was to take novolin 70/30 25 units in the morning and at bedtime and he was was not to take the humalog.  However,humalog is still listed on his AVS.    Other? No   Any new allergies since your discharge? No   Do you have support at home? Yes , his wife.   Home Care and Equipment/Supplies: Were home health services ordered? no If so, what is the name of the agency? n/a  Has the agency set up a time to come to the patient's home? not applicable Were any new equipment or medical supplies ordered?  No What is the name of the medical supply agency? n/a Were you able to get the supplies/equipment? not applicable Do you have any questions related to the use of the equipment or supplies? No   Has BP monitor.  BP this morning 90/68. Has glucometer. Blood sugar this morning 109.    He has been referred to outpatient PT/OT/ST but has not been contacted with appointment times.   Functional Questionnaire: (I = Independent and D = Dependent) ADLs: his wife provides any needed assistance. She has been administering his insulin and checking his blood sugars. He thinks he may benefit from a cane.   Follow up appointments reviewed:   PCP Hospital f/u appt confirmed? Yes  Angus Seller, PA @ [facility] 09/25/2020.  Dr Laural Benes - 10/27/2020.   Specialist Hospital f/u appt confirmed? Yes -cardiology - 10/09/2020.  He needs to call to schedule an appointment with [facility].   Are transportation arrangements needed? may need to schedule with [employer] if his wife is working.   If their condition worsens, is the pt aware to call PCP or go to the Emergency Dept.? Yes  Was the patient provided with contact information for the PCP's office or ED? Yes  Was to pt encouraged to call back with questions or concerns? Yes

## 2020-09-20 NOTE — Telephone Encounter (Signed)
Spoke to Jason Stevenson about paramedicine and he was agreeable. Home visit scheduled for Friday at 10:00.

## 2020-09-21 ENCOUNTER — Emergency Department (HOSPITAL_COMMUNITY): Payer: Medicaid Other

## 2020-09-21 ENCOUNTER — Encounter (HOSPITAL_COMMUNITY): Payer: Self-pay

## 2020-09-21 ENCOUNTER — Inpatient Hospital Stay (HOSPITAL_COMMUNITY): Payer: Medicaid Other

## 2020-09-21 ENCOUNTER — Inpatient Hospital Stay (HOSPITAL_COMMUNITY)
Admission: EM | Admit: 2020-09-21 | Discharge: 2020-09-22 | DRG: 312 | Disposition: A | Discharge status: Home / Self Care | Payer: Medicaid Other | Attending: Internal Medicine | Admitting: Internal Medicine

## 2020-09-21 DIAGNOSIS — Z8616 Personal history of COVID-19: Secondary | ICD-10-CM

## 2020-09-21 DIAGNOSIS — E782 Mixed hyperlipidemia: Secondary | ICD-10-CM | POA: Diagnosis not present

## 2020-09-21 DIAGNOSIS — E119 Type 2 diabetes mellitus without complications: Secondary | ICD-10-CM | POA: Diagnosis present

## 2020-09-21 DIAGNOSIS — I5022 Chronic systolic (congestive) heart failure: Secondary | ICD-10-CM | POA: Diagnosis present

## 2020-09-21 DIAGNOSIS — Z794 Long term (current) use of insulin: Secondary | ICD-10-CM

## 2020-09-21 DIAGNOSIS — R55 Syncope and collapse: Principal | ICD-10-CM | POA: Diagnosis present

## 2020-09-21 DIAGNOSIS — Z823 Family history of stroke: Secondary | ICD-10-CM

## 2020-09-21 DIAGNOSIS — Z833 Family history of diabetes mellitus: Secondary | ICD-10-CM | POA: Diagnosis not present

## 2020-09-21 DIAGNOSIS — Z8249 Family history of ischemic heart disease and other diseases of the circulatory system: Secondary | ICD-10-CM | POA: Diagnosis not present

## 2020-09-21 DIAGNOSIS — Z7984 Long term (current) use of oral hypoglycemic drugs: Secondary | ICD-10-CM | POA: Diagnosis not present

## 2020-09-21 DIAGNOSIS — I513 Intracardiac thrombosis, not elsewhere classified: Secondary | ICD-10-CM

## 2020-09-21 DIAGNOSIS — E785 Hyperlipidemia, unspecified: Secondary | ICD-10-CM | POA: Diagnosis present

## 2020-09-21 DIAGNOSIS — I639 Cerebral infarction, unspecified: Secondary | ICD-10-CM | POA: Diagnosis present

## 2020-09-21 DIAGNOSIS — Z79899 Other long term (current) drug therapy: Secondary | ICD-10-CM | POA: Diagnosis not present

## 2020-09-21 DIAGNOSIS — I10 Essential (primary) hypertension: Secondary | ICD-10-CM | POA: Diagnosis not present

## 2020-09-21 DIAGNOSIS — Z8673 Personal history of transient ischemic attack (TIA), and cerebral infarction without residual deficits: Secondary | ICD-10-CM | POA: Diagnosis not present

## 2020-09-21 DIAGNOSIS — Z7982 Long term (current) use of aspirin: Secondary | ICD-10-CM

## 2020-09-21 DIAGNOSIS — G40909 Epilepsy, unspecified, not intractable, without status epilepticus: Secondary | ICD-10-CM | POA: Diagnosis present

## 2020-09-21 DIAGNOSIS — Z9181 History of falling: Secondary | ICD-10-CM | POA: Diagnosis not present

## 2020-09-21 DIAGNOSIS — Z681 Body mass index (BMI) 19 or less, adult: Secondary | ICD-10-CM | POA: Diagnosis not present

## 2020-09-21 DIAGNOSIS — E43 Unspecified severe protein-calorie malnutrition: Secondary | ICD-10-CM | POA: Insufficient documentation

## 2020-09-21 DIAGNOSIS — I110 Hypertensive heart disease with heart failure: Secondary | ICD-10-CM | POA: Diagnosis present

## 2020-09-21 DIAGNOSIS — I428 Other cardiomyopathies: Secondary | ICD-10-CM | POA: Diagnosis present

## 2020-09-21 DIAGNOSIS — Z7901 Long term (current) use of anticoagulants: Secondary | ICD-10-CM | POA: Diagnosis not present

## 2020-09-21 DIAGNOSIS — Z20822 Contact with and (suspected) exposure to covid-19: Secondary | ICD-10-CM | POA: Diagnosis present

## 2020-09-21 DIAGNOSIS — E1159 Type 2 diabetes mellitus with other circulatory complications: Secondary | ICD-10-CM | POA: Diagnosis not present

## 2020-09-21 DIAGNOSIS — E876 Hypokalemia: Secondary | ICD-10-CM | POA: Diagnosis present

## 2020-09-21 DIAGNOSIS — R569 Unspecified convulsions: Secondary | ICD-10-CM

## 2020-09-21 HISTORY — DX: Nontraumatic intracerebral hemorrhage, unspecified: I61.9

## 2020-09-21 LAB — URINALYSIS, ROUTINE W REFLEX MICROSCOPIC
Bacteria, UA: NONE SEEN
Bilirubin Urine: NEGATIVE
Glucose, UA: NEGATIVE mg/dL
Hgb urine dipstick: NEGATIVE
Ketones, ur: NEGATIVE mg/dL
Nitrite: NEGATIVE
Protein, ur: NEGATIVE mg/dL
Specific Gravity, Urine: 1.03 (ref 1.005–1.030)
pH: 6 (ref 5.0–8.0)

## 2020-09-21 LAB — BASIC METABOLIC PANEL
Anion gap: 11 (ref 5–15)
BUN: 18 mg/dL (ref 6–20)
CO2: 26 mmol/L (ref 22–32)
Calcium: 8.4 mg/dL — ABNORMAL LOW (ref 8.9–10.3)
Chloride: 100 mmol/L (ref 98–111)
Creatinine, Ser: 0.95 mg/dL (ref 0.61–1.24)
GFR, Estimated: >60 mL/min (ref >60)
Glucose, Bld: 99 mg/dL (ref 70–99)
Potassium: 4.1 mmol/L (ref 3.5–5.1)
Sodium: 137 mmol/L (ref 135–145)

## 2020-09-21 LAB — CBC
HCT: 39.6 % (ref 39.0–52.0)
Hemoglobin: 12.5 g/dL — ABNORMAL LOW (ref 13.0–17.0)
MCH: 28.7 pg (ref 26.0–34.0)
MCHC: 31.6 g/dL (ref 30.0–36.0)
MCV: 91 fL (ref 80.0–100.0)
Platelets: 346 10*3/uL (ref 150–400)
RBC: 4.35 MIL/uL (ref 4.22–5.81)
RDW: 11.8 % (ref 11.5–15.5)
WBC: 5.9 10*3/uL (ref 4.0–10.5)
nRBC: 0 % (ref 0.0–0.2)

## 2020-09-21 LAB — GLUCOSE, CAPILLARY
Glucose-Capillary: 101 mg/dL — ABNORMAL HIGH (ref 70–99)
Glucose-Capillary: 300 mg/dL — ABNORMAL HIGH (ref 70–99)
Glucose-Capillary: 238 mg/dL — ABNORMAL HIGH (ref 70–99)

## 2020-09-21 LAB — CBG MONITORING, ED: Glucose-Capillary: 88 mg/dL (ref 70–99)

## 2020-09-21 LAB — PHENYTOIN LEVEL, TOTAL: Phenytoin Lvl: 7 ug/mL — ABNORMAL LOW (ref 10.0–20.0)

## 2020-09-21 LAB — PROTIME-INR
INR: 2.6 — ABNORMAL HIGH (ref 0.8–1.2)
Prothrombin Time: 27.5 seconds — ABNORMAL HIGH (ref 11.4–15.2)

## 2020-09-21 LAB — SARS CORONAVIRUS 2 (TAT 6-24 HRS): SARS Coronavirus 2: NEGATIVE

## 2020-09-21 LAB — DIGOXIN LEVEL: Digoxin Level: <0.2 ng/mL — ABNORMAL LOW (ref 0.8–2.0)

## 2020-09-21 MED ORDER — WARFARIN - PHARMACIST DOSING INPATIENT: Freq: Every day | Status: DC

## 2020-09-21 MED ORDER — FUROSEMIDE 20 MG PO TABS
20.0000 mg | ORAL_TABLET | Freq: Every day | ORAL | Status: DC
  Administered 2020-09-21 – 2020-09-22 (×2): 20 mg via ORAL
  Filled 2020-09-21 (×2): qty 1

## 2020-09-21 MED ORDER — INSULIN ASPART 100 UNIT/ML IJ SOLN
0.0000 [IU] | Freq: Three times a day (TID) | INTRAMUSCULAR | Status: DC
  Administered 2020-09-21: 8 [IU] via SUBCUTANEOUS
  Administered 2020-09-22: 3 [IU] via SUBCUTANEOUS

## 2020-09-21 MED ORDER — PHENYTOIN SODIUM EXTENDED 100 MG PO CAPS
100.0000 mg | ORAL_CAPSULE | Freq: Once | ORAL | Status: AC
  Administered 2020-09-21: 100 mg via ORAL
  Filled 2020-09-21: qty 1

## 2020-09-21 MED ORDER — ACETAMINOPHEN 325 MG PO TABS: 650.0000 mg | ORAL_TABLET | ORAL | Status: DC | PRN

## 2020-09-21 MED ORDER — ACETAMINOPHEN 650 MG RE SUPP: 650.0000 mg | RECTAL | Status: DC | PRN

## 2020-09-21 MED ORDER — OXCARBAZEPINE 300 MG PO TABS
600.0000 mg | ORAL_TABLET | Freq: Two times a day (BID) | ORAL | Status: DC
  Administered 2020-09-21 – 2020-09-22 (×3): 600 mg via ORAL
  Filled 2020-09-21 (×3): qty 2

## 2020-09-21 MED ORDER — PHENYTOIN SODIUM EXTENDED 100 MG PO CAPS: 100.0000 mg | ORAL_CAPSULE | Freq: Three times a day (TID) | ORAL | Status: DC

## 2020-09-21 MED ORDER — SPIRONOLACTONE 12.5 MG HALF TABLET
12.5000 mg | ORAL_TABLET | Freq: Every day | ORAL | Status: DC
  Administered 2020-09-21 – 2020-09-22 (×2): 12.5 mg via ORAL
  Filled 2020-09-21 (×2): qty 1

## 2020-09-21 MED ORDER — PHENYTOIN SODIUM EXTENDED 100 MG PO CAPS
100.0000 mg | ORAL_CAPSULE | Freq: Two times a day (BID) | ORAL | Status: DC
  Administered 2020-09-21 – 2020-09-22 (×2): 100 mg via ORAL
  Filled 2020-09-21 (×2): qty 1

## 2020-09-21 MED ORDER — IVABRADINE HCL 7.5 MG PO TABS
7.5000 mg | ORAL_TABLET | Freq: Two times a day (BID) | ORAL | Status: DC
  Administered 2020-09-21 – 2020-09-22 (×2): 7.5 mg via ORAL
  Filled 2020-09-21 (×3): qty 1

## 2020-09-21 MED ORDER — LEVETIRACETAM 500 MG PO TABS
1500.0000 mg | ORAL_TABLET | Freq: Two times a day (BID) | ORAL | Status: DC
Start: 2020-09-21 — End: 2020-09-22
  Administered 2020-09-21 – 2020-09-22 (×3): 1500 mg via ORAL
  Filled 2020-09-21 (×3): qty 3

## 2020-09-21 MED ORDER — ENOXAPARIN SODIUM 40 MG/0.4ML IJ SOSY: 40.0000 mg | PREFILLED_SYRINGE | INTRAMUSCULAR | Status: DC

## 2020-09-21 MED ORDER — ASPIRIN EC 81 MG PO TBEC
81.0000 mg | DELAYED_RELEASE_TABLET | Freq: Every day | ORAL | Status: DC
  Administered 2020-09-21 – 2020-09-22 (×2): 81 mg via ORAL
  Filled 2020-09-21 (×4): qty 1

## 2020-09-21 MED ORDER — ATORVASTATIN CALCIUM 40 MG PO TABS
80.0000 mg | ORAL_TABLET | Freq: Every day | ORAL | Status: DC
  Administered 2020-09-21 – 2020-09-22 (×2): 80 mg via ORAL
  Filled 2020-09-21 (×3): qty 2

## 2020-09-21 MED ORDER — ACETAMINOPHEN 160 MG/5ML PO SOLN: 650.0000 mg | ORAL | Status: DC | PRN

## 2020-09-21 MED ORDER — DIGOXIN 125 MCG PO TABS
0.1250 mg | ORAL_TABLET | Freq: Every day | ORAL | Status: DC
  Administered 2020-09-21 – 2020-09-22 (×2): 0.125 mg via ORAL
  Filled 2020-09-21 (×2): qty 1

## 2020-09-21 MED ORDER — SENNOSIDES-DOCUSATE SODIUM 8.6-50 MG PO TABS: 1.0000 | ORAL_TABLET | Freq: Every evening | ORAL | Status: DC | PRN

## 2020-09-21 MED ORDER — STROKE: EARLY STAGES OF RECOVERY BOOK
Freq: Once | Status: AC
  Filled 2020-09-21: qty 1

## 2020-09-21 MED ORDER — PHENYTOIN SODIUM EXTENDED 30 MG PO CAPS
150.0000 mg | ORAL_CAPSULE | Freq: Every day | ORAL | Status: DC
  Administered 2020-09-21: 150 mg via ORAL
  Filled 2020-09-21 (×2): qty 5

## 2020-09-21 MED ORDER — EZETIMIBE 10 MG PO TABS
10.0000 mg | ORAL_TABLET | Freq: Every day | ORAL | Status: DC
  Administered 2020-09-21 – 2020-09-22 (×2): 10 mg via ORAL
  Filled 2020-09-21 (×3): qty 1

## 2020-09-21 MED ORDER — WARFARIN SODIUM 5 MG PO TABS
5.0000 mg | ORAL_TABLET | Freq: Once | ORAL | Status: AC
  Administered 2020-09-21: 5 mg via ORAL
  Filled 2020-09-21: qty 1

## 2020-09-21 NOTE — Evaluation (Signed)
Physical Therapy Evaluation Patient Details Name: Jason Stevenson MRN: [id number] DOB: 1986-04-03 Today's Date: 09/21/2020   History of Present Illness  35 y.o male admitted on 09/21/2020 presenting to ED following syncopal episode and fall from toilet with lac to L eye. Recently discharged from here for subacute hemorrhagic infarcts involving the anterior right frontal lobe and left parieto-occipital cortex as above, with  additional chronic right parietal infarct. Pt also found to be in Hyperosmolar hyperglycemic state. .09/13/20, chronic systolic heart failure w/ new LV thrombus. PMH includes HTN, DM, CHF, nonischemic cardiomyopathy  Clinical Impression  Patient was recently discharged from [facility] requiring supervision from wife for safety. Patient currently functioning at supervision level for mobility. Required minguard/light minA to recover from LOB during obstacle negotiation. Patient presents with decreased safety awareness, generalized weakness, impaired balance, decreased activity tolerance. Patient with negative orthostatic vitals but HR increasing to 120-130s during light activity. Patient will benefit from skilled PT services during acute stay to address listed deficits. Recommend OPPT following discharge to continue addressing strength and balance deficits.     Follow Up Recommendations Outpatient PT (neuro)    Equipment Recommendations  None recommended by PT    Recommendations for Other Services       Precautions / Restrictions Precautions Precautions: Fall;Other (comment) Precaution Comments: HR monitoring Restrictions Weight Bearing Restrictions: No      Mobility  Bed Mobility Overal bed mobility: Modified Independent Bed Mobility: Supine to Sit;Sit to Supine     Supine to sit: Modified independent (Device/Increase time);HOB elevated Sit to supine: Modified independent (Device/Increase time)   General bed mobility comments: Therapist assist with line management.     Transfers Overall transfer level: Needs assistance Equipment used: None Transfers: Sit to/from Stand Sit to Stand: Supervision         General transfer comment: supervision for safety  Ambulation/Gait Ambulation/Gait assistance: Supervision Gait Distance (Feet): 250 Feet Assistive device: None Gait Pattern/deviations: Step-through pattern Gait velocity: decreased   General Gait Details: slow steady pace. LOB x1 during obstacle negotiation challenge requiring min guard/light minA to recover  Stairs Stairs: Yes Stairs assistance: Supervision Stair Management: No rails;Alternating pattern;Forwards Number of Stairs: 2 General stair comments: supervision for safety  Wheelchair Mobility    Modified Rankin (Stroke Patients Only)       Balance Overall balance assessment: Needs assistance Sitting-balance support: No upper extremity supported;Feet supported Sitting balance-Leahy Scale: Normal     Standing balance support: No upper extremity supported;During functional activity Standing balance-Leahy Scale: Fair Standing balance comment: supervision for safety                 Standardized Balance Assessment Standardized Balance Assessment : Dynamic Gait Index   Dynamic Gait Index Level Surface: Normal Change in Gait Speed: Normal Gait with Horizontal Head Turns: Mild Impairment Gait with Vertical Head Turns: Mild Impairment Gait and Pivot Turn: Mild Impairment Step Over Obstacle: Moderate Impairment Step Around Obstacles: Mild Impairment Steps: Normal Total Score: 18       Pertinent Vitals/Pain Pain Assessment: Faces Faces Pain Scale: No hurt Pain Intervention(s): Monitored during session    Home Living Family/patient expects to be discharged to:: Private residence Living Arrangements: Spouse/significant other Available Help at Discharge: Family Type of Home: House Home Access: Stairs to enter Entrance Stairs-Rails: None Entrance Stairs-Number  of Steps: 3 Home Layout: One level Home Equipment: None      Prior Function Level of Independence: Independent         Comments: not working currently because  of "medical issues"; previously worked in Primary school teacher; computer work. Was receiving supervision from wife between previous hospitalization and current hospitalization.     Hand Dominance   Dominant Hand: Right    Extremity/Trunk Assessment   Upper Extremity Assessment Upper Extremity Assessment: Defer to OT evaluation    Lower Extremity Assessment Lower Extremity Assessment: Generalized weakness    Cervical / Trunk Assessment Cervical / Trunk Assessment: Normal  Communication   Communication: No difficulties  Cognition Arousal/Alertness: Awake/alert Behavior During Therapy: WFL for tasks assessed/performed Overall Cognitive Status: Impaired/Different from baseline Area of Impairment: Attention;Safety/judgement;Awareness;Problem solving;Following commands                   Current Attention Level: Alternating   Following Commands: Follows multi-step commands with increased time Safety/Judgement: Decreased awareness of safety;Decreased awareness of deficits Awareness: Emergent Problem Solving: Slow processing General Comments: Pt following 3 step commands with increased time for processing and rehearsal of 3 step command. Pt with decreased attention almost passing door he was asked to go though in 3 step command, but able to self correct. Pt pausing during functional mobility to answer questions or conversate. Patient with decreased awareness of deficits and safety with use of humor as deterant      General Comments General comments (skin integrity, edema, etc.): BP: supine 113/86 (95); EOB HR 123, BP 119/89 (99); standing HR 122bpm, BP 119/91 (100). HR increased 120-130s during activity    Exercises     Assessment/Plan    PT Assessment Patient needs continued PT services  PT Problem List  Decreased strength;Decreased balance;Decreased cognition;Decreased mobility;Decreased safety awareness       PT Treatment Interventions DME instruction;Gait training;Stair training;Functional mobility training;Therapeutic activities;Balance training;Therapeutic exercise;Neuromuscular re-education;Patient/family education    PT Goals (Current goals can be found in the Care Plan section)  Acute Rehab PT Goals Patient Stated Goal: to go home PT Goal Formulation: With patient Time For Goal Achievement: 10/05/20 Potential to Achieve Goals: Good Additional Goals Additional Goal #1: Patient will score >19/24 on DGI to reduce fall risk    Frequency Min 3X/week   Barriers to discharge        Co-evaluation               AM-PAC PT "6 Clicks" Mobility  Outcome Measure Help needed turning from your back to your side while in a flat bed without using bedrails?: None Help needed moving from lying on your back to sitting on the side of a flat bed without using bedrails?: None Help needed moving to and from a bed to a chair (including a wheelchair)?: A Little Help needed standing up from a chair using your arms (e.g., wheelchair or bedside chair)?: A Little Help needed to walk in hospital room?: A Little Help needed climbing 3-5 steps with a railing? : A Little 6 Click Score: 20    End of Session Equipment Utilized During Treatment: Gait belt Activity Tolerance: Patient tolerated treatment well Patient left: in bed;with call bell/phone within reach;with bed alarm set Nurse Communication: Mobility status PT Visit Diagnosis: Other abnormalities of gait and mobility (R26.89);Muscle weakness (generalized) (M62.81)    Time: 0272-5366 PT Time Calculation (min) (ACUTE ONLY): 8 min   Charges:   PT Evaluation $PT Eval Low Complexity: 1 Low          Paul Torpey A. Dan Humphreys PT, DPT Acute Rehabilitation Services Pager [phone] Office [phone]   Viviann Spare 09/21/2020, 5:29  PM

## 2020-09-21 NOTE — Progress Notes (Signed)
PT Cancellation Note  Patient Details Name: Jason Stevenson MRN: [id number] DOB: 1985-07-07   Cancelled Treatment:    Reason Eval/Treat Not Completed: Patient at procedure or test/unavailable Currently at MRI. Will attempt to return if time/schedule allow.    Madelaine Etienne, DPT, PN1   Supplemental Physical Therapist [facility]    Pager [phone] Acute Rehab Office [phone]

## 2020-09-21 NOTE — H&P (Signed)
History and Physical    [name] XJO:[id number] DOB: Nov 08, 1985 DOA: 09/21/2020  PCP: Ladell Pier, MD Consultants:  None Patient coming from: Home - lives with wife; NOK: Wife, [phone]  Chief Complaint: Near syncope  HPI: Jason Stevenson is a 35 y.o. male with medical history significant of chronic systolic CHF (EF 68-08% with LV thrombus); DM; HTN; HLD; medication noncompliance; and COVID infection in 07/2020 who was admitted for acute hemorrhagic CVA from 5/30-6/7.  He had intermittent seizure activity confirmed on continuous EEG and he was started on Keppra, Dilantin, and Trileptal.  He was doing great after d/c.  This AM, he went to use the commode and his wife assisted him to get there.  He was sitting on the commode and he had a BM without straining.  He got light-headed and fell off the commode.  He hit his L forehead.  He did not lose consciousness.  He felt fine prior and feels fine now.  Mild LUE tingling near his lateral shoulder.  Mild weakness in BLE with ambulation, requiring assistance with ambulation.  No dysphagia, dysarthria.    ED Course: Here for seizure/stroke and d/c 2 days ago.  On Coumadin for LV thrombus.  Went to have a BM, had near syncope and fell and hit head.  CT shows new cerebellar infarct without bleed.  Neurology recommends admission and repeat MRI.  Review of Systems: As per HPI; otherwise review of systems reviewed and negative.   Ambulatory Status:  Ambulates with assistance from his wife  COVID Vaccine Status:  Complete  Past Medical History:  Diagnosis Date   Chronic systolic CHF (congestive heart failure) ([place]) 06/18/2015   Diabetes mellitus without complication ([place])    Hemorrhagic stroke ([place])    Hyperlipidemia    Hypertension    Nonischemic cardiomyopathy ([place]) Noted as early as 2007   Per chart review (cards consult note 2011), EF of 40% in 2007, down to 20-25% in 2011    Past Surgical History:  Procedure Laterality  Date   None      Social History   Socioeconomic History   Marital status: Married    Spouse name: Not on file   Number of children: Not on file   Years of education: Not on file   Highest education level: Not on file  Occupational History   Occupation: unemployed  Tobacco Use   Smoking status: Never   Smokeless tobacco: Never  Vaping Use   Vaping Use: Not on file  Substance and Sexual Activity   Alcohol use: Not Currently    Comment: "occasional" when "hanging out with the wrong people" No recent use.   Drug use: Not Currently    Types: Marijuana    Comment: occasional, last 2013   Sexual activity: Not Currently  Other Topics Concern   Not on file  Social History Narrative   Pt lives alone. Has no information on father.   Social Determinants of Health   Financial Resource Strain: High Risk   Difficulty of Paying Living Expenses: Hard  Food Insecurity: No Food Insecurity   Worried About Charity fundraiser in the Last Year: Never true   Ran Out of Food in the Last Year: Never true  Transportation Needs: Unmet Transportation Needs   Lack of Transportation (Medical): Yes   Lack of Transportation (Non-Medical): Yes  Physical Activity: Not on file  Stress: Not on file  Social Connections: Not on file  Intimate Partner Violence: Not on file  No Known Allergies  Family History  Problem Relation Age of Onset   Stroke Mother    Diabetes Mother    Hypertension Mother    Stroke Maternal Aunt    Heart attack Neg Hx     Prior to Admission medications   Medication Sig Start Date End Date Taking? Authorizing Provider  acetaminophen (TYLENOL) 500 MG tablet Take 1,500 mg by mouth every 6 (six) hours as needed for moderate pain or headache.   Yes [provider]  aspirin 81 MG EC tablet Take 1 tablet (81 mg total) by mouth daily. Swallow whole. 09/19/20  Yes Darliss Cheney, MD  atorvastatin (LIPITOR) 80 MG tablet Take 1 tablet (80 mg total) by mouth daily. 09/19/20  10/19/20 Yes Pahwani, Einar Grad, MD  Blood Glucose Monitoring Suppl (TRUE METRIX METER) w/Device KIT 1 each by Does not apply route 3 (three) times daily. 07/02/18  Yes Fulp, Cammie, MD  Blood Pressure Monitor DEVI Use as directed to check home blood pressure 2-3 times a week 10/12/18  Yes Ladell Pier, MD  digoxin (LANOXIN) 0.125 MG tablet Take 1 tablet (0.125 mg total) by mouth daily. 09/19/20 10/19/20 Yes Pahwani, Einar Grad, MD  ezetimibe (ZETIA) 10 MG tablet Take 1 tablet (10 mg total) by mouth daily. 09/19/20 10/19/20 Yes Pahwani, Einar Grad, MD  furosemide (LASIX) 20 MG tablet Take 1 tablet (20 mg total) by mouth daily. 09/19/20 10/19/20 Yes Pahwani, Einar Grad, MD  glipiZIDE (GLUCOTROL) 10 MG tablet Take 1 tablet (10 mg total) by mouth daily before breakfast. To lower blood sugar 09/19/20 10/19/20 Yes Pahwani, Einar Grad, MD  glucose blood test strip Use as instructed Patient taking differently: Use as instructed 2-3 times per day 07/02/18  Yes Fulp, Cammie, MD  insulin isophane & regular human (NOVOLIN 70/30 FLEXPEN) (70-30) 100 UNIT/ML KwikPen Inject 22 Units into the skin in the morning and at bedtime. 09/20/20 10/20/20 Yes Newlin, Charlane Ferretti, MD  insulin lispro (HUMALOG) 100 UNIT/ML injection inj 10-20 units three time a day with meals Patient taking differently: Inject 10-20 Units into the skin See admin instructions. Per sliding scale with meals 11/25/19  Yes Ladell Pier, MD  Insulin Pen Needle (PEN NEEDLES) 31G X 8 MM MISC UAD 04/26/19  Yes Ladell Pier, MD  Insulin Pen Needle 32G X 4 MM MISC Use in the morning, at noon, and at bedtime. 09/19/20  Yes Pahwani, Einar Grad, MD  Insulin Syringe-Needle U-100 (RELION INSULIN SYRINGE) 31G X 15/64" 0.3 ML MISC Use to inject insulin daily. 07/13/18  Yes Ladell Pier, MD  ivabradine (CORLANOR) 7.5 MG TABS tablet Take 1 tablet (7.5 mg total) by mouth 2 (two) times daily with a meal. 09/19/20 10/19/20 Yes Pahwani, Einar Grad, MD  levETIRAcetam (KEPPRA) 750 MG tablet Take 2 tablets (1,500 mg total)  by mouth 2 (two) times daily. 09/19/20 10/19/20 Yes Pahwani, Einar Grad, MD  losartan (COZAAR) 25 MG tablet Take 1/2 tablet (12.5 mg total) by mouth daily. 09/19/20 10/19/20 Yes Pahwani, Einar Grad, MD  metFORMIN (GLUCOPHAGE) 500 MG tablet Take 1 tablet (500 mg total) by mouth 2 (two) times daily with a meal. TAKE 1 TABLET BY MOUTH 2 TIMES DAILY WITH A MEAL Patient taking differently: Take 500 mg by mouth 2 (two) times daily with a meal. 11/25/19  Yes Ladell Pier, MD  metoprolol (TOPROL-XL) 200 MG 24 hr tablet Take 1 tablet (200 mg total) by mouth daily. Take with or immediately following a meal. 09/19/20 10/19/20 Yes Pahwani, Einar Grad, MD  nitroGLYCERIN (NITROSTAT) 0.4 MG SL tablet  Place 1 tablet (0.4 mg total) under the tongue every 5 (five) minutes as needed for chest pain. 02/20/15  Yes Josue Hector, MD  Oxcarbazepine (TRILEPTAL) 300 MG tablet Take 2 tablets (600 mg total) by mouth 2 (two) times daily. 09/19/20 10/19/20 Yes Pahwani, Einar Grad, MD  phenytoin (DILANTIN) 100 MG ER capsule Take 1 capsule (100 mg total) by mouth 3 (three) times daily. 09/19/20 10/19/20 Yes Pahwani, Einar Grad, MD  spironolactone (ALDACTONE) 25 MG tablet Take 1/2 tablet (12.5 mg total) by mouth daily. 09/19/20 10/19/20 Yes Pahwani, Einar Grad, MD  warfarin (COUMADIN) 5 MG tablet Take 1 tablet (5 mg total) by mouth daily. 09/19/20 10/19/20 Yes Darliss Cheney, MD    Physical Exam: Vitals:   09/21/20 0930 09/21/20 1021 09/21/20 1052 09/21/20 1540  BP: 111/79  106/78 114/85  Pulse: (!) 106  (!) 105 (!) 114  Resp: _0 Temp:  98.1 F (36.7 C) 98.7 F (37.1 C) 97.6 F (36.4 C)  TempSrc:  Oral Axillary Oral  SpO2: 99%   100%     General:  Appears calm and comfortable and is in NAD Eyes:  PERRL, EOMI, normal lids, iris ENT:  grossly normal hearing, lips & tongue, mmm Neck:  no LAD, masses or thyromegaly Cardiovascular:  RR with mild tachycardia, no m/r/g. No LE edema.  Respiratory:   CTA bilaterally with no wheezes/rales/rhonchi.  Normal respiratory  effort. Abdomen:  soft, NT, ND Skin:  no rash or induration seen on limited exam Musculoskeletal:  grossly normal tone BUE/BLE, good ROM, no bony abnormality Lower extremity:  No LE edema.  Limited foot exam with no ulcerations.  2+ distal pulses. Psychiatric:  grossly normal mood and affect, speech fluent and appropriate, AOx3 Neurologic:  CN 2-12 grossly intact, moves all extremities in coordinated fashion, sensation intact    Radiological Exams on Admission: Independently reviewed - see discussion in A/P where applicable  CT HEAD WO CONTRAST  Result Date: 09/21/2020 CLINICAL DATA:  Altered mental status EXAM: CT HEAD WITHOUT CONTRAST TECHNIQUE: Contiguous axial images were obtained from the base of the skull through the vertex without intravenous contrast. COMPARISON:  Three days ago FINDINGS: Brain: Remote right MCA branch infarcts affecting parietal and frontal lobes. Small remote left occipital parietal cortex infarct. New low-density in the low and parasagittal left cerebellum. Although the posterior fossa is usually affected by streak artifact the low-density is very discrete and favored to be a true finding. No hemorrhage, hydrocephalus, or masslike finding. Vascular: No hyperdense vessel or unexpected calcification. Skull: Normal. Negative for fracture or focal lesion. Sinuses/Orbits: Negative IMPRESSION: 1. New low-density in the left cerebellum suggesting acute infarct. 2. Remote right more than left cerebral infarcts. Electronically Signed   By: Monte Fantasia M.D.   On: 09/21/2020 06:50   MR ANGIO HEAD WO CONTRAST  Result Date: 09/21/2020 CLINICAL DATA:  Neuro deficit, acute, stroke suspected. EXAM: MRI HEAD WITHOUT CONTRAST MRA HEAD WITHOUT CONTRAST MRA NECK WITHOUT CONTRAST TECHNIQUE: Multiplanar, multi-echo pulse sequences of the brain and surrounding structures were acquired without intravenous contrast. Angiographic images of the Circle of Willis were acquired using MRA technique  without intravenous contrast. Angiographic images of the neck were acquired using MRA technique without and with intravenous contrast. Carotid stenosis measurements (when applicable) are obtained utilizing NASCET criteria, using the distal internal carotid diameter as the denominator. COMPARISON:  Head CT September 21, 2020; head MRI September 18, 2020; CT angiogram of the head and neck Sep 12, 2020. FINDINGS: MRI HEAD FINDINGS  Brain: Areas of encephalomalacia and gliosis in the right frontal lobe and right temporoparietal region with susceptibility artifact consistent with hemosiderin deposits are again seen. Cortical increased T2 signal with T2 shine through on the diffusion-weighted sequence is seen in the right frontal lobe, similar to prior MRI. There is also no significant change of the T2 hyperintensity involving the bilateral basal ganglia and right radiating white matter tracts. Susceptibility artifact in the bilateral occipital lobes are also unchanged. Remote lacunar infarct in the right cerebellar hemisphere. No evidence of an acute infarct, acute hemorrhage, hydrocephalus, extra-axial collection or mass lesion. Vascular: Normal flow voids. Skull and upper cervical spine: Normal marrow signal. Sinuses/Orbits: Mild mucosal thickening of the ethmoid cells. The orbits are maintained. MRA HEAD FINDINGS Anterior circulation: The visualized portions of the distal cervical and intracranial internal carotid arteries are widely patent with normal flow related enhancement. The bilateral anterior cerebral arteries and middle cerebral arteries are widely patent with antegrade flow without high-grade flow-limiting stenosis or proximal branch occlusion. No intracranial aneurysm within the anterior circulation. Posterior circulation: The vertebral arteries are widely patent with antegrade flow. The posterior inferior cerebral arteries are normal. Vertebrobasilar junction and basilar artery are widely patent with antegrade flow  without evidence of basilar stenosis or aneurysm. Posterior cerebral arteries are normal bilaterally. No intracranial aneurysm within the posterior circulation. Anatomic variants: Hypoplastic left vertebral artery. MRA NECK FINDINGS Aortic arch:  Not included. Right carotid system: Normal course and caliber without stenosis or evidence of dissection. Left carotid system: Normal course and caliber without stenosis or evidence of dissection. Vertebral arteries: Right dominant with hypoplastic left vertebral artery. Vertebral artery origins are normal. Vertebral arteries are normal in course and caliber to the vertebrobasilar confluence without stenosis or evidence of dissection. Other: None IMPRESSION: 1. No acute intracranial abnormality. No significant change from prior MRI. 2. Increased T2 signal in the right frontal cortex, bilateral basal ganglia and radiating white matter tracts appear unchanged. Differential considerations remain the same including post ictal and metabolic derangement. 3. Unremarkable MR angiogram of the head and neck. Electronically Signed   By: Pedro Earls M.D.   On: 09/21/2020 15:53   MR ANGIO NECK WO CONTRAST  Result Date: 09/21/2020 CLINICAL DATA:  Neuro deficit, acute, stroke suspected. EXAM: MRI HEAD WITHOUT CONTRAST MRA HEAD WITHOUT CONTRAST MRA NECK WITHOUT CONTRAST TECHNIQUE: Multiplanar, multi-echo pulse sequences of the brain and surrounding structures were acquired without intravenous contrast. Angiographic images of the Circle of Willis were acquired using MRA technique without intravenous contrast. Angiographic images of the neck were acquired using MRA technique without and with intravenous contrast. Carotid stenosis measurements (when applicable) are obtained utilizing NASCET criteria, using the distal internal carotid diameter as the denominator. COMPARISON:  Head CT September 21, 2020; head MRI September 18, 2020; CT angiogram of the head and neck Sep 12, 2020.  FINDINGS: MRI HEAD FINDINGS Brain: Areas of encephalomalacia and gliosis in the right frontal lobe and right temporoparietal region with susceptibility artifact consistent with hemosiderin deposits are again seen. Cortical increased T2 signal with T2 shine through on the diffusion-weighted sequence is seen in the right frontal lobe, similar to prior MRI. There is also no significant change of the T2 hyperintensity involving the bilateral basal ganglia and right radiating white matter tracts. Susceptibility artifact in the bilateral occipital lobes are also unchanged. Remote lacunar infarct in the right cerebellar hemisphere. No evidence of an acute infarct, acute hemorrhage, hydrocephalus, extra-axial collection or mass lesion. Vascular: Normal flow voids.  Skull and upper cervical spine: Normal marrow signal. Sinuses/Orbits: Mild mucosal thickening of the ethmoid cells. The orbits are maintained. MRA HEAD FINDINGS Anterior circulation: The visualized portions of the distal cervical and intracranial internal carotid arteries are widely patent with normal flow related enhancement. The bilateral anterior cerebral arteries and middle cerebral arteries are widely patent with antegrade flow without high-grade flow-limiting stenosis or proximal branch occlusion. No intracranial aneurysm within the anterior circulation. Posterior circulation: The vertebral arteries are widely patent with antegrade flow. The posterior inferior cerebral arteries are normal. Vertebrobasilar junction and basilar artery are widely patent with antegrade flow without evidence of basilar stenosis or aneurysm. Posterior cerebral arteries are normal bilaterally. No intracranial aneurysm within the posterior circulation. Anatomic variants: Hypoplastic left vertebral artery. MRA NECK FINDINGS Aortic arch:  Not included. Right carotid system: Normal course and caliber without stenosis or evidence of dissection. Left carotid system: Normal course and  caliber without stenosis or evidence of dissection. Vertebral arteries: Right dominant with hypoplastic left vertebral artery. Vertebral artery origins are normal. Vertebral arteries are normal in course and caliber to the vertebrobasilar confluence without stenosis or evidence of dissection. Other: None IMPRESSION: 1. No acute intracranial abnormality. No significant change from prior MRI. 2. Increased T2 signal in the right frontal cortex, bilateral basal ganglia and radiating white matter tracts appear unchanged. Differential considerations remain the same including post ictal and metabolic derangement. 3. Unremarkable MR angiogram of the head and neck. Electronically Signed   By: Pedro Earls M.D.   On: 09/21/2020 15:53   MR BRAIN WO CONTRAST  Result Date: 09/21/2020 CLINICAL DATA:  Neuro deficit, acute, stroke suspected. EXAM: MRI HEAD WITHOUT CONTRAST MRA HEAD WITHOUT CONTRAST MRA NECK WITHOUT CONTRAST TECHNIQUE: Multiplanar, multi-echo pulse sequences of the brain and surrounding structures were acquired without intravenous contrast. Angiographic images of the Circle of Willis were acquired using MRA technique without intravenous contrast. Angiographic images of the neck were acquired using MRA technique without and with intravenous contrast. Carotid stenosis measurements (when applicable) are obtained utilizing NASCET criteria, using the distal internal carotid diameter as the denominator. COMPARISON:  Head CT September 21, 2020; head MRI September 18, 2020; CT angiogram of the head and neck Sep 12, 2020. FINDINGS: MRI HEAD FINDINGS Brain: Areas of encephalomalacia and gliosis in the right frontal lobe and right temporoparietal region with susceptibility artifact consistent with hemosiderin deposits are again seen. Cortical increased T2 signal with T2 shine through on the diffusion-weighted sequence is seen in the right frontal lobe, similar to prior MRI. There is also no significant change of the T2  hyperintensity involving the bilateral basal ganglia and right radiating white matter tracts. Susceptibility artifact in the bilateral occipital lobes are also unchanged. Remote lacunar infarct in the right cerebellar hemisphere. No evidence of an acute infarct, acute hemorrhage, hydrocephalus, extra-axial collection or mass lesion. Vascular: Normal flow voids. Skull and upper cervical spine: Normal marrow signal. Sinuses/Orbits: Mild mucosal thickening of the ethmoid cells. The orbits are maintained. MRA HEAD FINDINGS Anterior circulation: The visualized portions of the distal cervical and intracranial internal carotid arteries are widely patent with normal flow related enhancement. The bilateral anterior cerebral arteries and middle cerebral arteries are widely patent with antegrade flow without high-grade flow-limiting stenosis or proximal branch occlusion. No intracranial aneurysm within the anterior circulation. Posterior circulation: The vertebral arteries are widely patent with antegrade flow. The posterior inferior cerebral arteries are normal. Vertebrobasilar junction and basilar artery are widely patent with antegrade flow without evidence  of basilar stenosis or aneurysm. Posterior cerebral arteries are normal bilaterally. No intracranial aneurysm within the posterior circulation. Anatomic variants: Hypoplastic left vertebral artery. MRA NECK FINDINGS Aortic arch:  Not included. Right carotid system: Normal course and caliber without stenosis or evidence of dissection. Left carotid system: Normal course and caliber without stenosis or evidence of dissection. Vertebral arteries: Right dominant with hypoplastic left vertebral artery. Vertebral artery origins are normal. Vertebral arteries are normal in course and caliber to the vertebrobasilar confluence without stenosis or evidence of dissection. Other: None IMPRESSION: 1. No acute intracranial abnormality. No significant change from prior MRI. 2. Increased  T2 signal in the right frontal cortex, bilateral basal ganglia and radiating white matter tracts appear unchanged. Differential considerations remain the same including post ictal and metabolic derangement. 3. Unremarkable MR angiogram of the head and neck. Electronically Signed   By: Pedro Earls M.D.   On: 09/21/2020 15:53    EKG: Independently reviewed.  Sinus tachycardia with rate 107; nonspecific ST changes with no evidence of acute ischemia   Labs on Admission: I have personally reviewed the available labs and imaging studies at the time of the admission.  Pertinent labs:   Unremarkable BMP Unremarkable CBC INR 2.6 Dig <0.2 Phenytoin 7 COVID negative UA: trace LE   Assessment/Plan Principal Problem:   Near syncope Active Problems:   HTN (hypertension)   HLD (hyperlipidemia)   Type 2 diabetes mellitus (HCC)   Chronic systolic CHF (congestive heart failure) (HCC)   Seizure (HCC)   Acute CVA (cerebrovascular accident) ([place])   Syncope -Patient with post-BM near syncopal episode at home -CT with concern for new CVA -Also with recent seizures so this is a consideration  CVA -Patient with prior hospitalization last week for acute CVA, presenting with new symptoms concerning for another CVA -CT with ditzel concerning for CVA, particularly in light of known LV thrombus -Aspirin has been given to reduce stroke mortality and decrease morbidity -Will admit for recurrent CVA evaluation -Telemetry monitoring -MRI/MRA -Neurology consult -PT/OT/ST/Nutrition Consults  Seizure -Patient with apparent seizure(s) during prior hospitalization -Intermittent EEG confirmed seizure activity during prior hospitalization -Subtherapeutic Dilantin level on presentation -Continue Keppra, Trileptal, Dilantin (dosing per pharmacy) -Repeat EEG, per neurology -Seizure precautions  HTN -Allow permissive HTN for now -Treat BP only if >220/120, and then with goal of 15%  reduction -Hold ARB (Cozaar) and BB (Toprol XL) and plan to restart in 48-72 hours   HLD -Continue Lipitor 80 mg daily, Zetia   DM -Recent A1c shows very poor control, >15 -Hold home PO medications (Glucotrol, Glucophage) -Hold 70/30 insulin due to hypoglycemia previously and low-normal glucose now -Insulin dosing may need to be adjusted once the patient is fully compliant -Will order moderate-scale SSI  Chronic systolic CHF -11/13/13 echo with EF 10-15% -Continue Digoxin, Ivabradine, Lasix, Aldactone -Undetectable level noted, raising the question of compliance -Recent LV thrombus noted so continuing Coumadin - pharmacy to dose      Note: This patient has been tested and is negative for the novel coronavirus COVID-19. She has been fully vaccinated against COVID-19.    DVT prophylaxis:  Coumadin Code Status: Full - confirmed with patient/family Family Communication: Wife present throughout evaluation Disposition Plan:  The patient is from: home  Anticipated d/c is to: home without [facility] services   Anticipated d/c date will depend on clinical response to treatment, likely 1-3 days  Patient is currently: acutely ill Consults called: Neurology; PT/OT/ST/Nutrition; [facility] team Admission status: Admit - It  is my clinical opinion that admission to INPATIENT is reasonable and necessary because of the expectation that this patient will require hospital care that crosses at least 2 midnights to treat this condition based on the medical complexity of the problems presented.  Given the aforementioned information, the predictability of an adverse outcome is felt to be significant.      Karmen Bongo MD [facility]   How to contact the [facility] Attending or Consulting provider [place] or covering provider during after hours [place], for this patient?  Check the care team in [facility] and look for a) attending/consulting [facility] provider listed and b) the [facility] team listed Log into [email or web address] and use  [place]'s universal password to access. If you do not have the password, please contact the hospital operator. Locate the [facility] provider you are looking for under [facility] and page to a number that you can be directly reached. If you still have difficulty reaching the provider, please page the [facility] (Director on Call) for the Hospitalists listed on amion for assistance.   09/21/2020, 6:13 PM

## 2020-09-21 NOTE — Plan of Care (Signed)

## 2020-09-21 NOTE — ED Triage Notes (Signed)
Pt comes via [facility], had a syncopal episode while on the toilet, hit head on floor, LOC, lac to L eye, recently discharged from here and is on coumadin.

## 2020-09-21 NOTE — Evaluation (Signed)
Occupational Therapy Evaluation Patient Details Name: Jason Stevenson MRN: [id number] DOB: [birth year] Today's Date: 09/21/2020    History of Present Illness 35 y.o male admitted on 09/21/2020 presenting to ED following syncopal episode and fall from toilet with lac to L eye. 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 MRI showing no acute change. PMH includes HTN, DM, CHF, nonischemic cardiomyopathy.   Clinical Impression   PTA, pt was independent until recent hospitalization and then required supervision from wife for safety. Currently, pt completing ADLs and functional mobility with supervision for safety. Pt performing 2-3 step task with increased time for processing. Pt presenting with decreased awareness, attention, safety, executive functioning, and problem solving. Recommend OP OT  and will continue to follow acutely to optimize safety and independence in ADLs, IADLs and executive functioning.   Pt known to therapist from previous admission 09/11/2020 - 09/19/2020. BP: supine 113/86 (95); EOB HR 123, BP 119/89 (99); standing HR 122bpm, BP 119/91 (100). HR elevating with light activity to the 120's to 130's.    Follow Up Recommendations  Outpatient OT    Equipment Recommendations  None recommended by OT    Recommendations for Other Services Speech consult     Precautions / Restrictions Precautions Precautions: Fall;Other (comment) Precaution Comments: HR monitoring Restrictions Weight Bearing Restrictions: No      Mobility Bed Mobility Overal bed mobility: Modified Independent Bed Mobility: Supine to Sit;Sit to Supine     Supine to sit: Modified independent (Device/Increase time);HOB elevated Sit to supine: Modified independent (Device/Increase time)   General bed mobility comments: Therapist assist with line management.     Transfers Overall transfer level: Needs assistance Equipment used: None Transfers: Sit to/from Stand Sit to Stand: Supervision         General transfer comment: supervision for safety    Balance Overall balance assessment: Needs assistance Sitting-balance support: No upper extremity supported;Feet supported Sitting balance-Leahy Scale: Normal     Standing balance support: No upper extremity supported;During functional activity Standing balance-Leahy Scale: Fair Standing balance comment: supervision for safety                 Standardized Balance Assessment Standardized Balance Assessment : Dynamic Gait Index   Dynamic Gait Index Level Surface: Normal Change in Gait Speed: Normal Gait with Horizontal Head Turns: Mild Impairment Gait with Vertical Head Turns: Mild Impairment Gait and Pivot Turn: Mild Impairment Step Over Obstacle: Moderate Impairment Step Around Obstacles: Mild Impairment Steps: Normal Total Score: 18     ADL either performed or assessed with clinical judgement   ADL Overall ADL's : Needs assistance/impaired Eating/Feeding: Modified independent       Upper Body Bathing: Supervision/ safety;Sitting   Lower Body Bathing: Supervison/ safety;Sit to/from stand   Upper Body Dressing : Supervision/safety;Sitting   Lower Body Dressing: Sit to/from stand;Supervision/safety   Toilet Transfer: Ambulation;Supervision/safety   Toileting- Clothing Manipulation and Hygiene: Supervision/safety;Sit to/from stand       Functional mobility during ADLs: Supervision/safety General ADL Comments: Pt completing functional mobility with supervision for safety. Pt with one LOB when challenged during functional mobility, requiring min A to correct. Pt with difficulty completing two tasks at once (i.e. functional mobility and talking).     Vision Baseline Vision/History: Wears glasses Patient Visual Report: No change from baseline Vision Assessment?: No  apparent visual deficits     Perception Perception Perception Tested?: No Comments:  no apparent perceptual difficulties   Praxis Praxis Praxis tested?: Not tested Praxis-Other Comments: no apparent motor planning difficulties    Pertinent Vitals/Pain Pain Assessment: Faces Faces Pain Scale: No hurt Pain Intervention(s): Monitored during session     Hand Dominance Right   Extremity/Trunk Assessment Upper Extremity Assessment Upper Extremity Assessment: Defer to OT evaluation   Lower Extremity Assessment Lower Extremity Assessment: Generalized weakness   Cervical / Trunk Assessment Cervical / Trunk Assessment: Normal   Communication Communication Communication: No difficulties   Cognition Arousal/Alertness: Awake/alert Behavior During Therapy: WFL for tasks assessed/performed Overall Cognitive Status: Impaired/Different from baseline Area of Impairment: Attention;Safety/judgement;Awareness;Problem solving;Following commands                   Current Attention Level: Alternating   Following Commands: Follows multi-step commands with increased time Safety/Judgement: Decreased awareness of safety;Decreased awareness of deficits Awareness: Emergent Problem Solving: Slow processing General Comments: Pt following 3 step commands with increased time for processing and rehearsal of 3 step command. Pt with decreased attention almost passing door he was asked to go though in 3 step command, but able to self correct. Pt pausing during functional mobility to answer questions or conversate. Patient with decreased awareness of deficits and safety with use of humor as deterant   General Comments  BP: supine 113/86 (95); EOB HR 123, BP 119/89 (99); standing HR 122bpm, BP 119/91 (100). HR increased 120-130s during activity    Exercises     Shoulder Instructions      Home Living Family/patient expects to be discharged to:: Private residence Living Arrangements:  Spouse/significant other Available Help at Discharge: Family Type of Home: House Home Access: Stairs to enter Secretary/administrator of Steps: 3 Entrance Stairs-Rails: None Home Layout: One level     Bathroom Shower/Tub: Chief Strategy Officer: Standard Bathroom Accessibility: No   Home Equipment: None      Lives With: Spouse    Prior Functioning/Environment Level of Independence: Independent        Comments: not working currently because of "medical issues"; previously worked in Primary school teacher; computer work. Was receiving supervision from wife between previous hospitalization and current hospitalization.        OT Problem List: Decreased activity tolerance;Decreased safety awareness;Decreased cognition      OT Treatment/Interventions: Self-care/ADL training;Therapeutic exercise;Therapeutic activities;Cognitive remediation/compensation;Patient/family education    OT Goals(Current goals can be found in the care plan section) Acute Rehab OT Goals Patient Stated Goal: to go home OT Goal Formulation: With patient Time For Goal Achievement: 10/05/20 Potential to Achieve Goals: Good ADL Goals Additional ADL Goal #1: Pt will perform 5 step trail making task in moderately distracting environment with 1-2 cues. Additional ADL Goal #2: Pt will complete 3+ step cooking task in a moderately distracting environment with mod I.  OT Frequency: Min 2X/week   Barriers to D/C:            Co-evaluation              AM-PAC OT "6 Clicks" Daily Activity     Outcome Measure Help from another person eating meals?: None Help from another person taking care of personal grooming?: A Little Help from another person toileting, which includes using toliet, bedpan, or urinal?: A Little Help from another person bathing (including washing, rinsing, drying)?: A Little Help from another person to put on and taking off regular upper body clothing?: None Help from another person  to put on and taking off regular lower body  clothing?: A Little 6 Click Score: 20   End of Session Equipment Utilized During Treatment: Gait belt Nurse Communication: Mobility status  Activity Tolerance: Patient tolerated treatment well Patient left: in bed;with call bell/phone within reach;with bed alarm set;with family/visitor present  OT Visit Diagnosis: Unsteadiness on feet (R26.81);Other symptoms and signs involving the nervous system (R29.898);Other symptoms and signs involving cognitive function                Time: 7124-5809 OT Time Calculation (min): 25 min Charges:  OT General Charges $OT Visit: 1 Visit OT Evaluation $OT Eval Moderate Complexity: 1 [address], OTDS   Ladene Artist 09/21/2020, 5:39 PM

## 2020-09-21 NOTE — Progress Notes (Addendum)
Phenytoin Initial Consult Indication: Seizure   No Known Allergies  Patient Measurements:     There is no height or weight on file to calculate BMI.   Vital signs: Temp: 98.7 F (37.1 C) (06/09 1052) Temp Source: Axillary (06/09 1052) BP: 106/78 (06/09 1052) Pulse Rate: 105 (06/09 1052)  Labs: Lab Results  Component Value Date/Time   Phenytoin Lvl 7.0 (L) 09/21/2020 0809   Lab Results  Component Value Date   PHENYTOIN 7.0 (L) 09/21/2020   Estimated Creatinine Clearance: 97.1 mL/min (by C-G formula based on SCr of 0.95 mg/dL).   Medications:  On phenytoin 100 mg TID (LD 6/8)  Assessment: Corrected phenytoin level (if needed): 8.9 Seizure activity: No new seizure activity - recent syncopal event prior to admission  Significant potential drug interactions: Not impacting phenytoin metabolism - phenytoin can impact warfarin sensitivity along with atorvastatin, digoxin, and ivabradine metabolism  Goals of care:  Total phenytoin level: 10-20 mcg/ml Free phenytoin level: 1-2 mcg/ml  Plan:  Anticipated maintenance dose: Increase dose by 50 mg - will do 100 mg in 1000 and 1600 then 150 mg at 2200  Recheck phenytoin level in 5-7 days  Pharmacy will continue to follow regarding obtaining total phenytoin levels and dose adjustments as indicated.   Sherron Monday, PharmD, BCCCP Clinical Pharmacist  Phone: [phone] 09/21/2020 10:59 AM  Please check AMION for all [facility] phone numbers After 10:00 PM, call Main Pharmacy [phone]

## 2020-09-21 NOTE — ED Notes (Signed)
Patient transported to CT 

## 2020-09-21 NOTE — ED Notes (Signed)
Attempted to give reportx1 

## 2020-09-21 NOTE — Consult Note (Signed)
Neurology Consultation  Reason for Consult: Syncopal episode this morning, CTH with new left cerebellar acute infarct Referring Physician: Dr. Jeanell Sparrow  CC: Syncopal episode with fall from toilet this morning  History is obtained from: Patient, Chart review, Patient's wife at bedside  HPI: Jason Stevenson is a 35 y.o. 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 Jason Stevenson states that he was having a bowel movement this morning when he began to feel lightheaded, briefly lost consciousness, and fell from the toilet striking his head on the floor obtaining a small, hemostatic laceration over his left eye. His wife immediately went to check on him and stated that his eyes were open and he was responsive within seconds of falling. He denies gait imbalance or noticing any further seizure activity since he was discharged from the hospital but endorses generalized deconditioning from recent prolonged hospitalization. CT imaging was obtained on arrival revealing a new low-density in the left cerebellum suggesting an acute infarct and remote right more than left cerebral infarcts.  Of note, he was admitted to the hospital from 09/11/2020 - 09/19/2020. On presentation, he was found to have a left facial droop, intermittent left sided twitching, mild left upper extremity weakness, and dysarthria. His neurologic deficits had resolved prior to hospital discharge. He was discharged on coumadin for his LV thrombus and CVAs, and on Keppra, Dilantin,Trileptal  for seizures. He endorses compliance with his medications since discharge.   ROS: A complete ROS was performed and is negative except as noted in the HPI.    Past Medical History:  Diagnosis Date   Chronic systolic CHF (congestive heart failure) ([place]) 06/18/2015   Diabetes mellitus without complication ([place])    Hyperlipidemia    Hypertension    Nonischemic cardiomyopathy ([place]) Noted as early as 2007   Per chart review (cards consult note 2011), EF of 40% in 2007, down to 20-25% in 2011   Past Surgical History:  Procedure Laterality Date   None     Family History  Problem Relation Age of Onset   Diabetes Mother    Hypertension Mother    Stroke Maternal Aunt    Heart attack Neg Hx    Social History:   reports that he has never smoked. He has never used smokeless tobacco. He reports current alcohol use. He reports that he does not use drugs.  Medications No current facility-administered medications for this encounter.  Current Outpatient Medications:    acetaminophen (TYLENOL) 500 MG tablet, Take 1,500 mg by mouth every 6 (six) hours as needed for moderate pain or headache., Disp: , Rfl:    aspirin 81 MG EC tablet, Take 1 tablet (81 mg total) by mouth daily. Swallow whole., Disp: 30 tablet, Rfl: 0   atorvastatin (LIPITOR) 80 MG tablet, Take 1 tablet (80 mg total) by mouth daily., Disp: 30 tablet, Rfl: 0   Blood Glucose Monitoring Suppl (TRUE METRIX METER) w/Device KIT, 1 each by Does not apply route 3 (three) times daily., Disp: 1 kit, Rfl: 0   Blood Pressure Monitor DEVI, Use as directed to check home blood pressure 2-3 times a week, Disp: 1 Device, Rfl: 0  digoxin (LANOXIN) 0.125 MG tablet, Take 1 tablet (0.125 mg total) by mouth daily., Disp: 30 tablet, Rfl: 0   ezetimibe (ZETIA) 10 MG tablet, Take 1 tablet (10 mg total) by mouth daily., Disp: 30 tablet, Rfl: 0   furosemide (LASIX) 20 MG tablet, Take 1 tablet (20 mg total) by mouth daily., Disp: 30 tablet, Rfl: 0   glipiZIDE  (GLUCOTROL) 10 MG tablet, Take 1 tablet (10 mg total) by mouth daily before breakfast. To lower blood sugar, Disp: 30 tablet, Rfl: 0   glucose blood test strip, Use as instructed (Patient taking differently: Use as instructed 2-3 times per day), Disp: 100 each, Rfl: 12   insulin isophane & regular human (NOVOLIN 70/30 FLEXPEN) (70-30) 100 UNIT/ML KwikPen, Inject 22 Units into the skin in the morning and at bedtime., Disp: 13.2 mL, Rfl: 0   insulin lispro (HUMALOG) 100 UNIT/ML injection, inj 10-20 units three time a day with meals (Patient taking differently: Inject 10-20 Units into the skin See admin instructions. Per sliding scale with meals), Disp: 10 mL, Rfl: 6   Insulin Pen Needle (PEN NEEDLES) 31G X 8 MM MISC, UAD, Disp: 100 each, Rfl: 6   Insulin Pen Needle 32G X 4 MM MISC, Use in the morning, at noon, and at bedtime., Disp: 100 each, Rfl: 0   Insulin Syringe-Needle U-100 (RELION INSULIN SYRINGE) 31G X 15/64" 0.3 ML MISC, Use to inject insulin daily., Disp: 100 each, Rfl: 11   ivabradine (CORLANOR) 7.5 MG TABS tablet, Take 1 tablet (7.5 mg total) by mouth 2 (two) times daily with a meal., Disp: 60 tablet, Rfl: 0   levETIRAcetam (KEPPRA) 750 MG tablet, Take 2 tablets (1,500 mg total) by mouth 2 (two) times daily., Disp: 120 tablet, Rfl: 0   losartan (COZAAR) 25 MG tablet, Take 1/2 tablet (12.5 mg total) by mouth daily., Disp: 15 tablet, Rfl: 0   metFORMIN (GLUCOPHAGE) 500 MG tablet, Take 1 tablet (500 mg total) by mouth 2 (two) times daily with a meal. TAKE 1 TABLET BY MOUTH 2 TIMES DAILY WITH A MEAL (Patient taking differently: Take 500 mg by mouth 2 (two) times daily with a meal.), Disp: 60 tablet, Rfl: 6   metoprolol (TOPROL-XL) 200 MG 24 hr tablet, Take 1 tablet (200 mg total) by mouth daily. Take with or immediately following a meal., Disp: 30 tablet, Rfl: 0   nitroGLYCERIN (NITROSTAT) 0.4 MG SL tablet, Place 1 tablet (0.4 mg total) under the tongue every 5 (five) minutes as needed for chest  pain., Disp: 25 tablet, Rfl: 2   Oxcarbazepine (TRILEPTAL) 300 MG tablet, Take 2 tablets (600 mg total) by mouth 2 (two) times daily., Disp: 120 tablet, Rfl: 0   phenytoin (DILANTIN) 100 MG ER capsule, Take 1 capsule (100 mg total) by mouth 3 (three) times daily., Disp: 90 capsule, Rfl: 0   spironolactone (ALDACTONE) 25 MG tablet, Take 1/2 tablet (12.5 mg total) by mouth daily., Disp: 15 tablet, Rfl: 0   warfarin (COUMADIN) 5 MG tablet, Take 1 tablet (5 mg total) by mouth daily., Disp: 30 tablet, Rfl: 0  Exam: Current vital signs: BP 111/79   Pulse (!) 106   Temp 98 F (36.7 C) (Oral)   Resp 20   SpO2 99%  Vital signs in last 24 hours: Temp:  [97.4 F (36.3 C)-98 F (36.7 C)] 98 F (36.7 C) (06/09 0828) Pulse Rate:  [106-116] 106 (06/09 0930) Resp:  [16-20] 20 (06/09 0930) BP: (106-121)/(70-96) 111/79 (06/09 0930) SpO2:  [99 %-100 %]  99 % (06/09 0930)  GENERAL: Laying comfortably in bed, awake, alert, in no acute distress Psych: Affect appropriate for situation, calm and cooperative with examination Head: Normocephalic and atraumatic without obvious abnormality EENT: Normal conjunctivae, no OP obstruction, dry mucous membranes LUNGS: Normal respiratory effort, non-labored breathing CV: Tachycardic on cardiac monitor ABDOMEN: Soft, non-tender Ext: warm, without obvious deformity  NEURO:  Mental Status: Awake, alert, and oriented x 3. Patient is able to provide a clear and coherent history of present illness. Speech is intact without dysarthria or aphasia. Naming, repetition, fluency, and comprehension are intact.  There are no signs of neglect present.  Cranial Nerves:  II: PERRL 4 mm/brisk. Visual fields full.  III, IV, VI: EOMI without ptosis V: Sensation is intact to light touch and symmetrical to face. VII: Face is symmetric resting and smiling. Able to puff cheeks and raise eyebrows.  VIII: Hearing is intact to voice IX, X: Palate elevation is symmetric. Phonation  normal.  XI: Normal sternocleidomastoid and trapezius muscle strength XII: Tongue protrudes midline without fasciculations.   Motor:  4/5 strength is all muscle groups without vertical drift throughout on assessment.  Generalized weakness reported due to recent and prolonged hospitalization / deconditioning Tone is normal. Bulk is normal.  Sensation: Intact to light touch bilaterally in all four extremities.  Coordination: FTN intact bilaterally. HKS intact bilaterally. No pronator drift. DTRs: 2+ and symmetric patellae and biceps Gait: deferred  NIHSS: 1a Level of Conscious.: 0 1b LOC Questions: 0 1c LOC Commands: 0 2 Best Gaze: 0 3 Visual: 0 4 Facial Palsy: 0 5a Motor Arm - left: 0 5b Motor Arm - Right: 0 6a Motor Leg - Left: 0 6b Motor Leg - Right: 0 7 Limb Ataxia: 0 8 Sensory: 0 9 Best Language: 0 10 Dysarthria: 0 11 Extinct. and Inatten.: 0 TOTAL: 0  Labs I have reviewed labs in epic and the results pertinent to this consultation are: CBC    Component Value Date/Time   WBC 5.9 09/21/2020 0551   RBC 4.35 09/21/2020 0551   HGB 12.5 (L) 09/21/2020 0551   HCT 39.6 09/21/2020 0551   PLT 346 09/21/2020 0551   MCV 91.0 09/21/2020 0551   MCH 28.7 09/21/2020 0551   MCHC 31.6 09/21/2020 0551   RDW 11.8 09/21/2020 0551   LYMPHSABS 2.6 11/14/2009 2120   MONOABS 0.4 11/14/2009 2120   EOSABS 0.1 11/14/2009 2120   BASOSABS 0.0 11/14/2009 2120   CMP     Component Value Date/Time   NA 137 09/21/2020 0551   NA 139 08/04/2019 0952   K 4.1 09/21/2020 0551   CL 100 09/21/2020 0551   CO2 26 09/21/2020 0551   GLUCOSE 99 09/21/2020 0551   BUN 18 09/21/2020 0551   BUN 11 08/04/2019 0952   CREATININE 0.95 09/21/2020 0551   CREATININE 0.80 04/17/2016 1204   CALCIUM 8.4 (L) 09/21/2020 0551   PROT 8.3 (H) 09/11/2020 2019   PROT 7.2 11/26/2019 0858   ALBUMIN 3.1 (L) 09/13/2020 0255   ALBUMIN 4.6 11/26/2019 0858   AST 19 09/11/2020 2019   ALT 17 09/11/2020 2019   ALKPHOS 70  09/11/2020 2019   BILITOT 0.7 09/11/2020 2019   BILITOT 0.4 11/26/2019 0858   GFRNONAA >60 09/21/2020 0551   GFRNONAA >89 04/17/2016 1204   GFRAA 138 08/04/2019 0952   GFRAA >89 04/17/2016 1204   Lipid Panel     Component Value Date/Time   CHOL 380 (H) 09/12/2020 0201   CHOL 201 (H) 05/12/2019  0726   TRIG 149 09/12/2020 0201   HDL 45 09/12/2020 0201   HDL 55 05/12/2019 0726   CHOLHDL 8.4 09/12/2020 0201   VLDL 30 09/12/2020 0201   LDLCALC 305 (H) 09/12/2020 0201   LDLCALC 127 (H) 05/12/2019 0726   Lab Results  Component Value Date   HGBA1C >15.5 (H) 09/12/2020   Dilantin level currently 7 on 09/21/2020 Dilantin level 2 days ago: 12.2  Dilantin level 3 days ago: 12.6 Dilantin level 4 days ago: 14.4 Dilantin level 5 days ago 15.2  Dilantin level 6 days ago 18.4 Dilantin level 7 days ago: 21  Lab Results  Component Value Date   INR 2.6 (H) 09/21/2020   INR 2.5 (H) 09/19/2020   INR 2.8 (H) 09/18/2020   Imaging I have reviewed the images obtained: MRI brain, MRA head and neck, routine EEG pending  CT-scan of the brain 09/21/2020: 1. New low-density in the left cerebellum suggesting acute infarct. 2. Remote right more than left cerebral infarcts.  Echocardiogram 09/13/2020:  1. 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  2. Left ventricular ejection fraction, by estimation, is 10-15%. The left ventricle has severely decreased function. The left ventricle demonstrates global hypokinesis. The left ventricular internal cavity size was mildly  dilated. Left ventricular diastolic parameters are indeterminate.  3. Right ventricular systolic function is moderately reduced. The right ventricular size is normal. Tricuspid regurgitation signal is inadequate for assessing PA pressure.  4. Left atrial size was mildly dilated.  5. The mitral valve is grossly normal. Mild mitral valve regurgitation. No evidence of mitral stenosis.  6. The aortic valve is tricuspid. Aortic valve  regurgitation is not visualized. No aortic stenosis is present.  7. The inferior vena cava is dilated in size with <50% respiratory variability, suggesting right atrial pressure of 15 mmHg.   Assessment: 35 y.o. male with history of  DM2, nonischemic cardiomyopathy, chronic systolic CHF, HLD and HTN with recent discharge on 6/7. Previous hospitalization for seizures secondary to cardioembolic strokes - hemorrhagic infarcts right frontal lobe and left parieto-occipital cortex, severe cardiomyopathy with EF of 10-15%, severe hyperglycemia with an A1c of > 15.5%, and an LV thrombus identified on echocardiogram. Patient presented to the ED 09/21/2020 following a syncopal episode this morning during a bowel movement. A CTH was obtained revealing a left cerebellar low-density suggesting an acute infarct. - Stroke risk factors include recent history of strokes, family history of strokes (aunt), LV thrombus, hypertension, hyperlipidemia, CAD: severe nonischemic cardiomyopathy, uncontrolled type 2 diabetes mellitus, and prior marijuana use- quit in 2013. - Syncope etiology likely a vagal response during bowel movement. DDx includes acute ischemic stroke or seizures with loss of consciousness. MRI brain, MRA head and neck, and routine EEG pending.  - Acute left cerebellar infarct on CTH most likely secondary to known LV thrombus. Patient currently optimized on stroke prophylaxis with coumadin with goal INR of 2 - 3. INR 09/21/2020 is 2.6 - Dilantin level with downward trend over one week at current dose: 100 mg q8h with subtherapeutic levels in the ED. Pharmacy consulted for Dilantin dose adjustment.   Impression: Acute ischemic strokes; likely cardioembolic History of seizures  Recommendations: # Recent history of seizures - Admit for observation - Routine EEG - Inpatient seizure precautions - Continue home doses of Keppra and Trileptal - Pharmacy consulted for dosing of dilantin due to subtherapeutic serum  phenytoin level   # Acute left cerebellar infarct  # Chronic right > left cerebral infarcts - MRI brain -  MRA head and neck - Continue statin therapy - Frequent neuro checks - Prophylactic therapy- continue home coumadin with INR goal of 2 - 3  - INR 09/21/2020 therapeutic at 2.6 - Risk factor modification - Telemetry monitoring - PT consult, OT consult, Speech consult  Pt seen by NP/Neuro and later by MD.   Anibal Henderson, AGAC-NP [facility] Pager: [phone]  Neurology Attending Attestation   I have examined the patient at bedside and discussed the plan with Ms. Toberman NP. I agree with the findings in her note with the following updates:  MRI brain showed no e/o acute infarcts in cerebellum or elsewhere MRA H&N no hemodynamically significant stenoses  CNS imaging personally reviewed  Phenytoin to be adjusted per pharmacy; slightly subtherapeutic on admission  rEEG pending  Favor vagal syncope. All data thus far extremely reassuring. Will f/u EEG results tmrw, otherwise no further neurologic w/u indicated at this time.    Su Monks, MD [facility] [phone]   If 7pm- 7am, please page neurology on call as listed in [place].

## 2020-09-21 NOTE — Progress Notes (Signed)
OT Cancellation Note  Patient Details Name: Jason Stevenson MRN: [id number] DOB: 1985-09-05   Cancelled Treatment:    Reason Eval/Treat Not Completed: Patient at procedure or test/ unavailable.  Patient is off the floor for follow up scans.  OT to continue efforts as appropriate.    Rockie Vawter D Aaliyan Brinkmeier 09/21/2020, 3:30 PM

## 2020-09-21 NOTE — Progress Notes (Signed)
ANTICOAGULATION CONSULT NOTE - Initial Consult  Pharmacy Consult for Warfarin Indication: stroke and LV thrombus  No Known Allergies  Patient Measurements:   Weight: 64.5 kg  Vital Signs: Temp: 98.7 F (37.1 C) (06/09 1052) Temp Source: Axillary (06/09 1052) BP: 106/78 (06/09 1052) Pulse Rate: 105 (06/09 1052)  Labs: Recent Labs    09/19/20 0523 09/21/20 0551 09/21/20 0809  HGB 12.1* 12.5*  --   HCT 36.4* 39.6  --   PLT 308 346  --   LABPROT 26.8*  --  27.5*  INR 2.5*  --  2.6*  CREATININE 0.93 0.95  --     Estimated Creatinine Clearance: 97.1 mL/min (by C-G formula based on SCr of 0.95 mg/dL).   Medical History: Past Medical History:  Diagnosis Date   Chronic systolic CHF (congestive heart failure) ([facility]) 06/18/2015   Diabetes mellitus without complication ([facility])    Hemorrhagic stroke ([facility])    Hyperlipidemia    Hypertension    Nonischemic cardiomyopathy ([facility]) Noted as early as 2007   Per chart review (cards consult note 2011), EF of 40% in 2007, down to 20-25% in 2011    Assessment: 35 y.o. male with medical history significant of chronic systolic CHF (EF 95-62% with LV thrombus); DM; HTN; HLD; medication noncompliance; and COVID infection in 07/2020. 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 Pharmacy consulted to restart home warfarin for prior LV thrombus and new CVA.  INR today 2.6  Goal of Therapy:  INR 2-3   Plan:  Warfarin 5 mg po x 1 today Monitor daily INR and signs and symptoms of bleeding.  Jeanella Cara, PharmD, [facility] Clinical Pharmacist Please see AMION for all Pharmacists' Contact Phone Numbers 09/21/2020, 11:10 AM

## 2020-09-21 NOTE — ED Provider Notes (Signed)
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  Patient states he got up to go to the bathroom.  He had a bowel movement.  He called to his wife to come and help him get up.  He felt lightheaded.  He fell off the commode struck his head.  He denies any other injuries.  His wife states she cannot was on her way to help him when this occurred.  He states that his eyes were open.  He states he was able to hear her talking to him.  There is no evidence of seizure activity.  Patient had labs obtained and CAT scan prior to my evaluation. Labs are significant for mild hypokalemia.  CT of the head is significant for no bleeding but new low-density left cerebellum suggesting acute infarct.   Patient Active Problem List   Diagnosis Date Noted   Encounter for monitoring Coumadin therapy 09/20/2020   LV (left ventricular) mural thrombus 09/13/2020   Seizure ([place]) 09/13/2020   Neurological deficit present 09/12/2020   History of COVID-19 09/12/2020   Intracerebral hemorrhage 09/12/2020   Acute cerebrovascular accident (CVA) ([place]) 09/12/2020   Hyperosmolar hyperglycemic state (HHS) ([place]) 09/11/2020   COVID-19 08/23/2020   Noncompliance with medication treatment due to intermittent use of medication 10/12/2018   Dyslipidemia 04/17/2016   Uncontrolled type 2 diabetes mellitus without complication, with long-term current use of insulin 91/63/8466   Chronic systolic CHF (congestive heart failure) ([place]) 06/18/2015   Needs flu shot 12/23/2013   Type 2 diabetes mellitus ([place]) 11/18/2013   Diabetes ([place]) 06/17/2013   Non-ischemic  cardiomyopathy ([place]) 11/06/2012   HTN (hypertension) 11/06/2012   HLD (hyperlipidemia) 11/06/2012    Past Surgical History:  Procedure Laterality Date   None         Family History  Problem Relation Age of Onset   Diabetes Mother    Hypertension Mother    Stroke Maternal Aunt    Heart attack Neg Hx     Social History   Tobacco Use   Smoking status: Never   Smokeless tobacco: Never  Substance Use Topics   Alcohol use: Yes    Comment: "occasional" when "hanging out with the wrong people" No recent use.   Drug use: No    Types: Marijuana    Comment: Last in 2013    Home Medications Prior to Admission medications   Medication Sig Start Date End Date Taking? Authorizing Provider  acetaminophen (TYLENOL) 500 MG tablet Take 1,500 mg by mouth every 6 (six) hours as needed for moderate pain or headache.   Yes [provider]  aspirin 81 MG EC tablet Take 1 tablet (81 mg total) by mouth daily. Swallow whole. 09/19/20  Yes Darliss Cheney, MD  atorvastatin (LIPITOR) 80 MG tablet Take 1 tablet (80 mg total) by mouth daily. 09/19/20 10/19/20 Yes Pahwani, Einar Grad, MD  Blood Glucose Monitoring Suppl (TRUE METRIX METER) w/Device KIT 1 each by Does not apply route 3 (three) times daily. 07/02/18  Yes Fulp, Cammie, MD  Blood Pressure Monitor DEVI Use as directed to check home blood pressure 2-3 times a week 10/12/18  Yes Ladell Pier, MD  digoxin (LANOXIN) 0.125 MG tablet Take 1 tablet (0.125 mg total) by mouth daily. 09/19/20 10/19/20 Yes Pahwani, Einar Grad, MD  ezetimibe (ZETIA) 10 MG tablet Take 1 tablet (10 mg total) by mouth daily. 09/19/20 10/19/20 Yes Pahwani, Einar Grad, MD  furosemide (LASIX) 20 MG tablet Take 1 tablet (20 mg total) by mouth daily. 09/19/20 10/19/20 Yes Pahwani, Einar Grad, MD  glipiZIDE (GLUCOTROL) 10 MG tablet Take 1 tablet (10 mg total) by mouth daily before breakfast. To lower blood sugar 09/19/20 10/19/20 Yes Pahwani, Einar Grad, MD  glucose blood test strip Use as instructed Patient taking  differently: Use as instructed 2-3 times per day 07/02/18  Yes Fulp, Cammie, MD  insulin isophane & regular human (NOVOLIN 70/30 FLEXPEN) (70-30) 100 UNIT/ML KwikPen Inject 22 Units into the skin in the morning and at bedtime. 09/20/20 10/20/20 Yes Newlin, Charlane Ferretti, MD  insulin lispro (HUMALOG) 100 UNIT/ML injection inj 10-20 units three time a day with meals Patient taking differently: Inject 10-20 Units into the skin See admin instructions. Per sliding scale with meals 11/25/19  Yes Ladell Pier, MD  Insulin Pen Needle (PEN NEEDLES) 31G X 8 MM MISC UAD 04/26/19  Yes Ladell Pier, MD  Insulin Pen Needle 32G X 4 MM MISC Use in the morning, at noon, and at bedtime. 09/19/20  Yes Pahwani, Einar Grad, MD  Insulin Syringe-Needle U-100 (RELION INSULIN SYRINGE) 31G X 15/64" 0.3 ML MISC Use to inject insulin daily. 07/13/18  Yes Ladell Pier, MD  ivabradine (CORLANOR) 7.5 MG TABS tablet Take 1 tablet (7.5 mg total) by mouth 2 (two) times daily with a meal. 09/19/20 10/19/20 Yes Pahwani, Einar Grad, MD  levETIRAcetam (KEPPRA) 750 MG tablet Take 2 tablets (1,500 mg total) by mouth 2 (two) times daily. 09/19/20 10/19/20 Yes Pahwani, Einar Grad, MD  losartan (COZAAR) 25 MG tablet Take 1/2 tablet (12.5 mg total) by mouth daily. 09/19/20 10/19/20 Yes Pahwani, Einar Grad, MD  metFORMIN (GLUCOPHAGE) 500 MG tablet Take 1 tablet (500 mg total) by mouth 2 (two) times daily with a meal. TAKE 1 TABLET BY MOUTH 2 TIMES DAILY WITH A MEAL Patient taking differently: Take 500 mg by mouth 2 (two) times daily with a meal. 11/25/19  Yes Ladell Pier, MD  metoprolol (TOPROL-XL) 200 MG 24 hr tablet Take 1 tablet (200 mg total) by mouth daily. Take with or immediately following a meal. 09/19/20 10/19/20 Yes Pahwani, Einar Grad, MD  nitroGLYCERIN (NITROSTAT) 0.4 MG SL tablet Place 1 tablet (0.4 mg total) under the tongue every 5 (five) minutes as needed for chest pain. 02/20/15  Yes Josue Hector, MD  Oxcarbazepine (TRILEPTAL) 300 MG tablet Take 2 tablets (600 mg  total) by mouth 2 (two) times daily. 09/19/20 10/19/20 Yes Pahwani, Einar Grad, MD  phenytoin (DILANTIN) 100 MG ER capsule Take 1 capsule (100 mg total) by mouth 3 (three) times daily. 09/19/20 10/19/20 Yes Pahwani, Einar Grad, MD  spironolactone (ALDACTONE) 25 MG tablet Take 1/2 tablet (12.5 mg total) by mouth daily. 09/19/20 10/19/20 Yes Pahwani, Einar Grad, MD  warfarin (COUMADIN) 5 MG tablet Take 1 tablet (5 mg total) by mouth daily. 09/19/20 10/19/20 Yes Darliss Cheney, MD    Allergies    Patient has no known allergies.  Review of Systems   Review of Systems  All other systems reviewed and are negative.  Physical Exam Updated Vital Signs BP 106/70   Pulse (!) 109   Temp (!) 97.4 F (36.3 C)   Resp 16   SpO2 99%  Physical Exam Vitals and nursing note reviewed.  Constitutional:      Appearance: Normal appearance.  HENT:     Head: Normocephalic.     Comments: Abrasion left eyebrow    Right Ear: External ear normal.     Left Ear: External ear normal.     Nose: Nose normal.     Mouth/Throat:     Mouth: Mucous membranes are moist.  Eyes:     Extraocular Movements: Extraocular movements intact.     Pupils: Pupils are equal, round, and reactive to light.  Cardiovascular:     Rate and Rhythm: Regular rhythm. Tachycardia present.  Pulmonary:     Effort: Pulmonary effort is normal.     Breath sounds: Normal breath sounds.  Abdominal:     General: Abdomen is flat. Bowel sounds are normal.     Palpations: Abdomen is soft.  Musculoskeletal:        General: Normal range of motion.     Cervical back: Normal range of motion.  Skin:    General: Skin is warm and dry.     Capillary Refill: Capillary refill takes less than 2 seconds.  Neurological:     General: No focal deficit present.     Mental Status: He is alert. Mental status is at baseline.     Cranial Nerves: Cranial nerves are intact. No cranial nerve deficit.     Sensory: No sensory deficit.     Motor: Motor function is intact. No weakness.      Coordination: Romberg sign negative. Finger-Nose-Finger Test and Heel to [facility] Test normal. Rapid alternating movements normal.     Deep Tendon Reflexes: Reflexes are normal and symmetric. Reflexes normal.    ED Results / Procedures / Treatments   Labs (all labs ordered are listed, but only abnormal results are displayed) Labs Reviewed  BASIC METABOLIC PANEL - Abnormal; Notable for the following components:      Result Value   Calcium 8.4 (*)    All other components within normal limits  CBC - Abnormal; Notable for the following components:   Hemoglobin 12.5 (*)    All other components within normal limits  URINALYSIS, ROUTINE W REFLEX MICROSCOPIC - Abnormal; Notable for the following components:   Leukocytes,Ua TRACE (*)    All other components within normal limits  PROTIME-INR  PHENYTOIN LEVEL, TOTAL  DIGOXIN LEVEL  CBG MONITORING, ED    EKG EKG Interpretation  Date/Time:  Thursday September 21 2020 05:50:37 EDT Ventricular Rate:  107 PR Interval:  146 QRS Duration: 106 QT Interval:  358 QTC Calculation: 477 R Axis:   121 Text Interpretation: Sinus tachycardia Possible Right ventricular hypertrophy Nonspecific T wave abnormality Abnormal ECG Confirmed by Pattricia Boss [phone]) on 09/21/2020 7:18:08 AM  Radiology CT HEAD WO CONTRAST  Result Date: 09/21/2020 CLINICAL DATA:  Altered mental status EXAM: CT HEAD WITHOUT CONTRAST TECHNIQUE: Contiguous axial images were obtained from the base of the skull through the vertex without intravenous contrast. COMPARISON:  Three days ago FINDINGS: Brain: Remote right MCA branch infarcts affecting parietal and frontal lobes. Small remote left occipital parietal cortex infarct. New low-density in the low and parasagittal left cerebellum. Although the posterior fossa is usually affected by streak artifact the low-density is very discrete and favored to be a true finding. No hemorrhage, hydrocephalus, or masslike finding. Vascular: No hyperdense vessel  or unexpected calcification. Skull: Normal. Negative for fracture or focal lesion. Sinuses/Orbits: Negative IMPRESSION: 1. New low-density in the left cerebellum suggesting acute  infarct. 2. Remote right more than left cerebral infarcts. Electronically Signed   By: Monte Fantasia M.D.   On: 09/21/2020 06:50    Procedures Procedures   Medications Ordered in ED Medications - No data to display  ED Course  I have reviewed the triage vital signs and the nursing notes.  Pertinent labs & imaging results that were available during my care of the patient were reviewed by me and considered in my medical decision making (see chart for details).    MDM Rules/Calculators/A&P                          Patient discharged 2 days ago after admission for stroke with left ventricular thrombus.  Today patient fell/syncopized from commode today.  CT obtained shows new left cerebellar infarct. Exam remains normal Discussed with Dr. Quinn Axe, on-call for neurology.  She advises patient will need admission with new stroke. Hospitalist paged Final Clinical Impression(s) / ED Diagnoses Final diagnoses:  None    Rx / DC Orders ED Discharge Orders     None        Pattricia Boss, MD 09/22/20 [phone]

## 2020-09-21 NOTE — Progress Notes (Signed)
EEG complete - results pending 

## 2020-09-22 ENCOUNTER — Other Ambulatory Visit: Payer: Self-pay

## 2020-09-22 ENCOUNTER — Telehealth (HOSPITAL_COMMUNITY): Payer: Self-pay

## 2020-09-22 DIAGNOSIS — E43 Unspecified severe protein-calorie malnutrition: Secondary | ICD-10-CM | POA: Insufficient documentation

## 2020-09-22 DIAGNOSIS — E1159 Type 2 diabetes mellitus with other circulatory complications: Secondary | ICD-10-CM

## 2020-09-22 DIAGNOSIS — R55 Syncope and collapse: Principal | ICD-10-CM

## 2020-09-22 DIAGNOSIS — E782 Mixed hyperlipidemia: Secondary | ICD-10-CM

## 2020-09-22 DIAGNOSIS — Z794 Long term (current) use of insulin: Secondary | ICD-10-CM

## 2020-09-22 DIAGNOSIS — I5022 Chronic systolic (congestive) heart failure: Secondary | ICD-10-CM

## 2020-09-22 LAB — CBC
HCT: 38.6 % — ABNORMAL LOW (ref 39.0–52.0)
Hemoglobin: 12.4 g/dL — ABNORMAL LOW (ref 13.0–17.0)
MCH: 28.4 pg (ref 26.0–34.0)
MCHC: 32.1 g/dL (ref 30.0–36.0)
MCV: 88.3 fL (ref 80.0–100.0)
Platelets: 324 10*3/uL (ref 150–400)
RBC: 4.37 MIL/uL (ref 4.22–5.81)
RDW: 11.9 % (ref 11.5–15.5)
WBC: 5.2 10*3/uL (ref 4.0–10.5)
nRBC: 0 % (ref 0.0–0.2)

## 2020-09-22 LAB — COMPREHENSIVE METABOLIC PANEL
ALT: 42 U/L (ref 0–44)
AST: 36 U/L (ref 15–41)
Albumin: 3 g/dL — ABNORMAL LOW (ref 3.5–5.0)
Alkaline Phosphatase: 78 U/L (ref 38–126)
Anion gap: 9 (ref 5–15)
BUN: 14 mg/dL (ref 6–20)
CO2: 26 mmol/L (ref 22–32)
Calcium: 9.1 mg/dL (ref 8.9–10.3)
Chloride: 103 mmol/L (ref 98–111)
Creatinine, Ser: 0.92 mg/dL (ref 0.61–1.24)
GFR, Estimated: >60 mL/min (ref >60)
Glucose, Bld: 184 mg/dL — ABNORMAL HIGH (ref 70–99)
Potassium: 4.5 mmol/L (ref 3.5–5.1)
Sodium: 138 mmol/L (ref 135–145)
Total Bilirubin: 0.5 mg/dL (ref 0.3–1.2)
Total Protein: 6.5 g/dL (ref 6.5–8.1)

## 2020-09-22 LAB — PROTIME-INR
INR: 3 — ABNORMAL HIGH (ref 0.8–1.2)
Prothrombin Time: 31.4 seconds — ABNORMAL HIGH (ref 11.4–15.2)

## 2020-09-22 LAB — GLUCOSE, CAPILLARY: Glucose-Capillary: 197 mg/dL — ABNORMAL HIGH (ref 70–99)

## 2020-09-22 MED ORDER — GLUCERNA SHAKE PO LIQD: 237.0000 mL | Freq: Three times a day (TID) | ORAL | Status: DC

## 2020-09-22 MED ORDER — WARFARIN SODIUM 1 MG PO TABS
1.0000 mg | ORAL_TABLET | Freq: Once | ORAL | Status: DC
  Filled 2020-09-22: qty 1

## 2020-09-22 MED ORDER — ADULT MULTIVITAMIN W/MINERALS CH: 1.0000 | ORAL_TABLET | Freq: Every day | ORAL | Status: DC

## 2020-09-22 MED ORDER — ENSURE MAX PROTEIN PO LIQD
11.0000 [oz_av] | Freq: Two times a day (BID) | ORAL | Status: DC
  Filled 2020-09-22 (×2): qty 330

## 2020-09-22 NOTE — Progress Notes (Signed)
Initial Nutrition Assessment  DOCUMENTATION CODES:  Severe malnutrition in context of chronic illness  INTERVENTION:  Add Glucerna Shake po TID, each supplement provides 220 kcal and 10 grams of protein.  Add Ensure Max po BID, each supplement provides 150 kcal and 30 grams of protein.    Add MVI with minerals daily.  NUTRITION DIAGNOSIS:  Severe Malnutrition related to chronic illness as evidenced by moderate fat depletion, severe fat depletion, moderate muscle depletion, severe muscle depletion.  GOAL:  Patient will meet greater than or equal to 90% of their needs  MONITOR:  PO intake, Supplement acceptance, Labs, Weight trends, I & O's  REASON FOR ASSESSMENT:  Consult Assessment of nutrition requirement/status  ASSESSMENT:  35 yo male with a PMH of chronic systolic CHF (EF 28-31% with LV thrombus), T2DM, HTN, HLD, medication noncompliance, recent prior CVA last week, and COVID infection in 07/2020 who presents with near syncope.  Spoke with pt and wife at bedside. Pt reports that he has been eating well at home, but he has been eating whatever he wants with little mind to his diabetes. He also has not been checking his blood sugar as frequently. Wife reports some lows - explained the best ways to combat lows (a snack before bedtime and how to raise glucose in event of a low).  Pt is unsure of weight loss exactly but he reports it was "a lot." Wife agrees. Per Epic, pt has lost ~25 lbs (15%) in 10 months, which is not necessarily significant for the time frame, but it is still concerning. This is likely due to poor glycemic control.  On exam, pt has moderate-severe depletions throughout body.   Given above information, pt severely malnourished in the chronic setting due to poor glycemic control.  Briefly discussed how to count carbohydrates and that not wanting the pt to lose anymore weight with the "Counting Carbohydrates for People with Diabetes" handout from the [employer]. Attached same handout to discharge summary  to serve as a second copy.  Recommend adding Glucerna shakes TID and Ensure Max BID to promote caloric and protein intake. Also recommend MVI with minerals.  Medications: reviewed; Lasix, SSI, Keppra, Dilantin, spironolactone, Warfarin  Labs: reviewed; CBG 88-300 HbA1c: >15.5 (08/2020)  NUTRITION - FOCUSED PHYSICAL EXAM: Flowsheet Row Most Recent Value  Orbital Region Severe depletion  Upper Arm Region Moderate depletion  Thoracic and Lumbar Region Moderate depletion  Buccal Region Severe depletion  Temple Region Severe depletion  Clavicle Bone Region Severe depletion  Clavicle and Acromion Bone Region Severe depletion  Scapular Bone Region Moderate depletion  Dorsal Hand Moderate depletion  Patellar Region Moderate depletion  Anterior Thigh Region Moderate depletion  Posterior Calf Region Moderate depletion  Edema (RD Assessment) Mild  [LLE]  Hair Reviewed  Eyes Reviewed  Mouth Reviewed  Skin Reviewed  Nails Reviewed   Diet Order:   Diet Order             Diet - low sodium heart healthy           Diet Carb Modified           Diet heart healthy/carb modified Room service appropriate? Yes; Fluid consistency: Thin; Fluid restriction: 1800 mL Fluid  Diet effective ____                  EDUCATION NEEDS:  Education needs have been addressed  Skin:  Skin Assessment: Reviewed RN Assessment  Last BM:  09/21/20 - Type 2,  medium  Height:  Ht Readings from Last 1 Encounters:  09/22/20 5\' 11"  (1.803 m)   Weight:  Wt Readings from Last 1 Encounters:  09/22/20 64.5 kg   Ideal Body Weight:  78.2 kg  BMI:  Body mass index is 19.83 kg/m.  Estimated Nutritional Needs:  Kcal:  2100-2300 Protein:  90-105 grams Fluid:  1800 ml fluid restriction  11/22/20, RD, LDN Registered Dietitian I After-Hours/Weekend Pager # [phone]

## 2020-09-22 NOTE — Plan of Care (Signed)
Pt A&OX4, pt BP 116/90   Pulse (!) 106   Temp 98 F (36.7 C) (Oral)   Resp 16   Ht 5\' 11"  (1.803 m)   Wt 64.5 kg   SpO2 100%   BMI 19.83 kg/m  Pt given belongings, AVS reviewewd with multiple questions answered about diet/activity/meds etc. Removed IV and telemetry. Pt escorted off floor by volunteer services. 

## 2020-09-22 NOTE — Discharge Summary (Signed)
Physician Discharge Summary  Jason Stevenson WUX:[id number] DOB: 01-15-1986 DOA: 09/21/2020  PCP: Ladell Pier, MD  Admit date: 09/21/2020 Discharge date: 09/22/2020  Admitted From: Home  Discharge disposition: Home   Recommendations for Outpatient Follow-Up:   Follow up with your primary care provider in one week.  Check CBC, BMP, magnesium in the next visit Follow-up with your cardiologist as scheduled by you.  Discharge Diagnosis:   Principal Problem:   Near syncope Active Problems:   HTN (hypertension)   HLD (hyperlipidemia)   Type 2 diabetes mellitus (HCC)   Chronic systolic CHF (congestive heart failure) (HCC)   Seizure (HCC)   Acute CVA (cerebrovascular accident) ([place])   Protein-calorie malnutrition, severe   Discharge Condition: Improved.  Diet recommendation: Low sodium, heart healthy.  Carbohydrate-modified.    Wound care: None.  Code status: Full.   History of Present Illness:   Jason Stevenson is a 35 y.o. male with medical history significant of chronic systolic CHF (EF 25-36% with LV thrombus); diabetes mellitus type 2, hypertension, hyperlipidemia, history of medication noncompliance, COVID infection in 07/2020 who was admitted for acute hemorrhagic CVA from 5/30-6/7.  Presented to hospital after having lightheadedness and syncope while using bathroom and having a bowel movement.  Patient was recently admitted to hospital for seizures and stroke and was discharged to this point.  He is on Coumadin for LV thrombus.  CT scan showed cerebellar infarct without bleed and patient was admitted hospital for further evaluation and treatment.  Hospital Course:   Following conditions were addressed during hospitalization as listed below,  Syncope Likely vasovagal during syncope.  Neurology was consulted.  No concern for seizure.  MRI of the brain did not show any acute stroke but old CVA.  She was advised orthostatic precautions as well.  EEG was  unremarkable.   CVA -Old CVA on the MRI.  No new CVA noted.  Seen by neurology.   Hypertension Resume ARB (Cozaar) and BB (Toprol XL)    Hyperlipidemia -Continue Lipitor 80 mg daily, Zetia   DM type II Hemoglobin A1c of more than 15.  On 70/30 insulin at home.  We will continue that on discharge.  Dietary modifications emphasized.   Chronic systolic CHF Previous 2D echocardiogram with left ventricular ejection fraction of 10 to 15% -Continue Digoxin, Ivabradine, Lasix, Aldactone.  Compliance emphasized.  On Coumadin for LV thrombus.  Severe protein calorie malnutrition.  Present on admission.  Patient was advised oral intake.  Disposition.  At this time, patient is stable for disposition home with outpatient PCP and cardiology follow-up.  Medical Consultants:   Neurology  Procedures:    EEG Subjective:   Today, patient was seen and examined at bedside.  Patient denies any dizziness, lightheadedness, shortness of breath, chest pain or palpitation.  Discharge Exam:   Vitals:   09/22/20 0825 09/22/20 1033  BP: 116/90 116/90  Pulse: (!) 106 (!) 106  Resp: 16 16  Temp: 98.2 F (36.8 C) 98 F (36.7 C)  SpO2: 100%    Vitals:   09/22/20 0030 09/22/20 0414 09/22/20 0825 09/22/20 1033  BP: (!) 112/56 118/86 116/90 116/90  Pulse: 98 (!) 104 (!) 106 (!) 106  Resp: _0 Temp: 97.7 F (36.5 C) 97.9 F (36.6 C) 98.2 F (36.8 C) 98 F (36.7 C)  TempSrc:  Oral Oral Oral  SpO2: 98% 100% 100%   Weight:    64.5 kg  Height:    _1  (1.803 m)  General: Alert awake, not in obvious distress HENT: pupils equally reacting to light,  No scleral pallor or icterus noted. Oral mucosa is moist.  Chest:  Clear breath sounds.  Diminished breath sounds bilaterally. No crackles or wheezes.  CVS: S1 &S2 heard. No murmur.  Regular rate and rhythm.  Mild tachycardia Abdomen: Soft, nontender, nondistended.  Bowel sounds are heard.   Extremities: No cyanosis, clubbing or edema.   Peripheral pulses are palpable. Psych: Alert, awake and oriented, normal mood CNS:  No cranial nerve deficits.  Power equal in all extremities.   Skin: Warm and dry.  No rashes noted.  The results of significant diagnostics from this hospitalization (including imaging, microbiology, ancillary and laboratory) are listed below for reference.     Diagnostic Studies:   CT HEAD WO CONTRAST  Result Date: 09/21/2020 CLINICAL DATA:  Altered mental status EXAM: CT HEAD WITHOUT CONTRAST TECHNIQUE: Contiguous axial images were obtained from the base of the skull through the vertex without intravenous contrast. COMPARISON:  Three days ago FINDINGS: Brain: Remote right MCA branch infarcts affecting parietal and frontal lobes. Small remote left occipital parietal cortex infarct. New low-density in the low and parasagittal left cerebellum. Although the posterior fossa is usually affected by streak artifact the low-density is very discrete and favored to be a true finding. No hemorrhage, hydrocephalus, or masslike finding. Vascular: No hyperdense vessel or unexpected calcification. Skull: Normal. Negative for fracture or focal lesion. Sinuses/Orbits: Negative IMPRESSION: 1. New low-density in the left cerebellum suggesting acute infarct. 2. Remote right more than left cerebral infarcts. Electronically Signed   By: Monte Fantasia M.D.   On: 09/21/2020 06:50   MR ANGIO HEAD WO CONTRAST  Result Date: 09/21/2020 CLINICAL DATA:  Neuro deficit, acute, stroke suspected. EXAM: MRI HEAD WITHOUT CONTRAST MRA HEAD WITHOUT CONTRAST MRA NECK WITHOUT CONTRAST TECHNIQUE: Multiplanar, multi-echo pulse sequences of the brain and surrounding structures were acquired without intravenous contrast. Angiographic images of the Circle of Willis were acquired using MRA technique without intravenous contrast. Angiographic images of the neck were acquired using MRA technique without and with intravenous contrast. Carotid stenosis measurements  (when applicable) are obtained utilizing NASCET criteria, using the distal internal carotid diameter as the denominator. COMPARISON:  Head CT September 21, 2020; head MRI September 18, 2020; CT angiogram of the head and neck Sep 12, 2020. FINDINGS: MRI HEAD FINDINGS Brain: Areas of encephalomalacia and gliosis in the right frontal lobe and right temporoparietal region with susceptibility artifact consistent with hemosiderin deposits are again seen. Cortical increased T2 signal with T2 shine through on the diffusion-weighted sequence is seen in the right frontal lobe, similar to prior MRI. There is also no significant change of the T2 hyperintensity involving the bilateral basal ganglia and right radiating white matter tracts. Susceptibility artifact in the bilateral occipital lobes are also unchanged. Remote lacunar infarct in the right cerebellar hemisphere. No evidence of an acute infarct, acute hemorrhage, hydrocephalus, extra-axial collection or mass lesion. Vascular: Normal flow voids. Skull and upper cervical spine: Normal marrow signal. Sinuses/Orbits: Mild mucosal thickening of the ethmoid cells. The orbits are maintained. MRA HEAD FINDINGS Anterior circulation: The visualized portions of the distal cervical and intracranial internal carotid arteries are widely patent with normal flow related enhancement. The bilateral anterior cerebral arteries and middle cerebral arteries are widely patent with antegrade flow without high-grade flow-limiting stenosis or proximal branch occlusion. No intracranial aneurysm within the anterior circulation. Posterior circulation: The vertebral arteries are widely patent with antegrade flow. The posterior inferior  cerebral arteries are normal. Vertebrobasilar junction and basilar artery are widely patent with antegrade flow without evidence of basilar stenosis or aneurysm. Posterior cerebral arteries are normal bilaterally. No intracranial aneurysm within the posterior circulation.  Anatomic variants: Hypoplastic left vertebral artery. MRA NECK FINDINGS Aortic arch:  Not included. Right carotid system: Normal course and caliber without stenosis or evidence of dissection. Left carotid system: Normal course and caliber without stenosis or evidence of dissection. Vertebral arteries: Right dominant with hypoplastic left vertebral artery. Vertebral artery origins are normal. Vertebral arteries are normal in course and caliber to the vertebrobasilar confluence without stenosis or evidence of dissection. Other: None IMPRESSION: 1. No acute intracranial abnormality. No significant change from prior MRI. 2. Increased T2 signal in the right frontal cortex, bilateral basal ganglia and radiating white matter tracts appear unchanged. Differential considerations remain the same including post ictal and metabolic derangement. 3. Unremarkable MR angiogram of the head and neck. Electronically Signed   By: Pedro Earls M.D.   On: 09/21/2020 15:53   MR ANGIO NECK WO CONTRAST  Result Date: 09/21/2020 CLINICAL DATA:  Neuro deficit, acute, stroke suspected. EXAM: MRI HEAD WITHOUT CONTRAST MRA HEAD WITHOUT CONTRAST MRA NECK WITHOUT CONTRAST TECHNIQUE: Multiplanar, multi-echo pulse sequences of the brain and surrounding structures were acquired without intravenous contrast. Angiographic images of the Circle of Willis were acquired using MRA technique without intravenous contrast. Angiographic images of the neck were acquired using MRA technique without and with intravenous contrast. Carotid stenosis measurements (when applicable) are obtained utilizing NASCET criteria, using the distal internal carotid diameter as the denominator. COMPARISON:  Head CT September 21, 2020; head MRI September 18, 2020; CT angiogram of the head and neck Sep 12, 2020. FINDINGS: MRI HEAD FINDINGS Brain: Areas of encephalomalacia and gliosis in the right frontal lobe and right temporoparietal region with susceptibility artifact  consistent with hemosiderin deposits are again seen. Cortical increased T2 signal with T2 shine through on the diffusion-weighted sequence is seen in the right frontal lobe, similar to prior MRI. There is also no significant change of the T2 hyperintensity involving the bilateral basal ganglia and right radiating white matter tracts. Susceptibility artifact in the bilateral occipital lobes are also unchanged. Remote lacunar infarct in the right cerebellar hemisphere. No evidence of an acute infarct, acute hemorrhage, hydrocephalus, extra-axial collection or mass lesion. Vascular: Normal flow voids. Skull and upper cervical spine: Normal marrow signal. Sinuses/Orbits: Mild mucosal thickening of the ethmoid cells. The orbits are maintained. MRA HEAD FINDINGS Anterior circulation: The visualized portions of the distal cervical and intracranial internal carotid arteries are widely patent with normal flow related enhancement. The bilateral anterior cerebral arteries and middle cerebral arteries are widely patent with antegrade flow without high-grade flow-limiting stenosis or proximal branch occlusion. No intracranial aneurysm within the anterior circulation. Posterior circulation: The vertebral arteries are widely patent with antegrade flow. The posterior inferior cerebral arteries are normal. Vertebrobasilar junction and basilar artery are widely patent with antegrade flow without evidence of basilar stenosis or aneurysm. Posterior cerebral arteries are normal bilaterally. No intracranial aneurysm within the posterior circulation. Anatomic variants: Hypoplastic left vertebral artery. MRA NECK FINDINGS Aortic arch:  Not included. Right carotid system: Normal course and caliber without stenosis or evidence of dissection. Left carotid system: Normal course and caliber without stenosis or evidence of dissection. Vertebral arteries: Right dominant with hypoplastic left vertebral artery. Vertebral artery origins are normal.  Vertebral arteries are normal in course and caliber to the vertebrobasilar confluence without stenosis or  evidence of dissection. Other: None IMPRESSION: 1. No acute intracranial abnormality. No significant change from prior MRI. 2. Increased T2 signal in the right frontal cortex, bilateral basal ganglia and radiating white matter tracts appear unchanged. Differential considerations remain the same including post ictal and metabolic derangement. 3. Unremarkable MR angiogram of the head and neck. Electronically Signed   By: Pedro Earls M.D.   On: 09/21/2020 15:53   MR BRAIN WO CONTRAST  Result Date: 09/21/2020 CLINICAL DATA:  Neuro deficit, acute, stroke suspected. EXAM: MRI HEAD WITHOUT CONTRAST MRA HEAD WITHOUT CONTRAST MRA NECK WITHOUT CONTRAST TECHNIQUE: Multiplanar, multi-echo pulse sequences of the brain and surrounding structures were acquired without intravenous contrast. Angiographic images of the Circle of Willis were acquired using MRA technique without intravenous contrast. Angiographic images of the neck were acquired using MRA technique without and with intravenous contrast. Carotid stenosis measurements (when applicable) are obtained utilizing NASCET criteria, using the distal internal carotid diameter as the denominator. COMPARISON:  Head CT September 21, 2020; head MRI September 18, 2020; CT angiogram of the head and neck Sep 12, 2020. FINDINGS: MRI HEAD FINDINGS Brain: Areas of encephalomalacia and gliosis in the right frontal lobe and right temporoparietal region with susceptibility artifact consistent with hemosiderin deposits are again seen. Cortical increased T2 signal with T2 shine through on the diffusion-weighted sequence is seen in the right frontal lobe, similar to prior MRI. There is also no significant change of the T2 hyperintensity involving the bilateral basal ganglia and right radiating white matter tracts. Susceptibility artifact in the bilateral occipital lobes are also  unchanged. Remote lacunar infarct in the right cerebellar hemisphere. No evidence of an acute infarct, acute hemorrhage, hydrocephalus, extra-axial collection or mass lesion. Vascular: Normal flow voids. Skull and upper cervical spine: Normal marrow signal. Sinuses/Orbits: Mild mucosal thickening of the ethmoid cells. The orbits are maintained. MRA HEAD FINDINGS Anterior circulation: The visualized portions of the distal cervical and intracranial internal carotid arteries are widely patent with normal flow related enhancement. The bilateral anterior cerebral arteries and middle cerebral arteries are widely patent with antegrade flow without high-grade flow-limiting stenosis or proximal branch occlusion. No intracranial aneurysm within the anterior circulation. Posterior circulation: The vertebral arteries are widely patent with antegrade flow. The posterior inferior cerebral arteries are normal. Vertebrobasilar junction and basilar artery are widely patent with antegrade flow without evidence of basilar stenosis or aneurysm. Posterior cerebral arteries are normal bilaterally. No intracranial aneurysm within the posterior circulation. Anatomic variants: Hypoplastic left vertebral artery. MRA NECK FINDINGS Aortic arch:  Not included. Right carotid system: Normal course and caliber without stenosis or evidence of dissection. Left carotid system: Normal course and caliber without stenosis or evidence of dissection. Vertebral arteries: Right dominant with hypoplastic left vertebral artery. Vertebral artery origins are normal. Vertebral arteries are normal in course and caliber to the vertebrobasilar confluence without stenosis or evidence of dissection. Other: None IMPRESSION: 1. No acute intracranial abnormality. No significant change from prior MRI. 2. Increased T2 signal in the right frontal cortex, bilateral basal ganglia and radiating white matter tracts appear unchanged. Differential considerations remain the same  including post ictal and metabolic derangement. 3. Unremarkable MR angiogram of the head and neck. Electronically Signed   By: Pedro Earls M.D.   On: 09/21/2020 15:53   EEG adult  Result Date: 09/22/2020 Lora Havens, MD     09/22/2020  8:44 AM Patient Name: Mylz Yuan MRN: [id number] Epilepsy Attending: Lora Havens Referring Physician/Provider: Lahoma Crocker  Ebbie Latus, NP Date: 09/21/2020 Duration: 24.11 mins Patient history: 35 year old male with history of epilepsy presented with syncopal episode.  EEG to evaluate for seizures. Level of alertness: Awake, asleep AEDs during EEG study: Keppra, Dilantin Technical aspects: This EEG study was done with scalp electrodes positioned according to the 10-20 International system of electrode placement. Electrical activity was acquired at a sampling rate of _0  and reviewed with a high frequency filter of _1  and a low frequency filter of _2 . EEG data were recorded continuously and digitally stored. Description: The posterior dominant rhythm consists of 9-10 Hz activity of moderate voltage (25-35 uV) seen predominantly in posterior head regions, symmetric and reactive to eye opening and eye closing.  Sleep was characterized by vertex waves, sleep spindles (12 to 14 Hz), maximal frontocentral region. Physiologic photic driving was seen during photic stimulation. Hyperventilation was not performed.   IMPRESSION: This study is within normal limits. No seizures or epileptiform discharges were seen throughout the recording. Lora Havens     Labs:   Basic Metabolic Panel: Recent Labs  Lab 09/16/20 0345 09/17/20 0157 09/18/20 0521 09/19/20 0523 09/21/20 0551 09/22/20 0503  NA 136 142 139 138 137 138  K 3.7 4.4 3.7 3.9 4.1 4.5  CL 100 103 103 101 100 103  CO2 _3 GLUCOSE 81 60* 50* 103* 99 184*  BUN _4 CREATININE 0.87 1.01 0.88 0.93 0.95 0.92  CALCIUM 9.0 9.3 8.7* 8.7* 8.4* 9.1  MG 1.9 2.2 1.8  1.9  --   --    GFR Estimated Creatinine Clearance: 100.3 mL/min (by C-G formula based on SCr of 0.92 mg/dL). Liver Function Tests: Recent Labs  Lab 09/22/20 0503  AST 36  ALT 42  ALKPHOS 78  BILITOT 0.5  PROT 6.5  ALBUMIN 3.0*   No results for input(s): LIPASE, AMYLASE in the last 168 hours. No results for input(s): AMMONIA in the last 168 hours. Coagulation profile Recent Labs  Lab 09/17/20 0157 09/18/20 0521 09/19/20 0523 09/21/20 0809 09/22/20 0503  INR 2.2* 2.8* 2.5* 2.6* 3.0*    CBC: Recent Labs  Lab 09/17/20 0157 09/18/20 0521 09/19/20 0523 09/21/20 0551 09/22/20 0503  WBC 6.9 7.3 6.2 5.9 5.2  HGB 13.2 13.0 12.1* 12.5* 12.4*  HCT 40.5 38.7* 36.4* 39.6 38.6*  MCV 88.4 85.6 86.7 91.0 88.3  PLT 395 313 308 346 324   Cardiac Enzymes: No results for input(s): CKTOTAL, CKMB, CKMBINDEX, TROPONINI in the last 168 hours. BNP: Invalid input(s): POCBNP CBG: Recent Labs  Lab 09/21/20 0649 09/21/20 1123 09/21/20 1709 09/21/20 2133 09/22/20 0815  GLUCAP 88 101* 300* 238* 197*   D-Dimer No results for input(s): DDIMER in the last 72 hours. Hgb A1c No results for input(s): HGBA1C in the last 72 hours. Lipid Profile No results for input(s): CHOL, HDL, LDLCALC, TRIG, CHOLHDL, LDLDIRECT in the last 72 hours. Thyroid function studies No results for input(s): TSH, T4TOTAL, T3FREE, THYROIDAB in the last 72 hours.  Invalid input(s): FREET3 Anemia work up No results for input(s): VITAMINB12, FOLATE, FERRITIN, TIBC, IRON, RETICCTPCT in the last 72 hours. Microbiology Recent Results (from the past 240 hour(s))  SARS CORONAVIRUS 2 (TAT 6-24 HRS) Nasopharyngeal Nasopharyngeal Swab     Status: None   Collection Time: 09/21/20  8:40 AM   Specimen: Nasopharyngeal Swab  Result Value Ref Range Status   SARS Coronavirus 2 NEGATIVE NEGATIVE Final    Comment: (NOTE) SARS-CoV-2 target nucleic acids are  NOT DETECTED.  The SARS-CoV-2 RNA is generally detectable in upper  and lower respiratory specimens during the acute phase of infection. Negative results do not preclude SARS-CoV-2 infection, do not rule out co-infections with other pathogens, and should not be used as the sole basis for treatment or other patient management decisions. Negative results must be combined with clinical observations, patient history, and epidemiological information. The expected result is Negative.  Fact Sheet for Patients: SugarRoll.be  Fact Sheet for Healthcare Providers: https://www.woods-mathews.com/  This test is not yet approved or cleared by the [place] FDA and  has been authorized for detection and/or diagnosis of SARS-CoV-2 by FDA under an Emergency Use Authorization (EUA). This EUA will remain  in effect (meaning this test can be used) for the duration of the COVID-19 declaration under Se ction 564(b)(1) of the Act, 21 U.S.C. section 360bbb-3(b)(1), unless the authorization is terminated or revoked sooner.  Performed at [facility], [place] [address], [place], [place] [address]      Discharge Instructions:   Discharge Instructions     Ambulatory referral to Occupational Therapy   Complete by: As directed    Ambulatory referral to Physical Therapy   Complete by: As directed    Diet - low sodium heart healthy   Complete by: As directed    Diet Carb Modified   Complete by: As directed    Discharge instructions   Complete by: As directed    Please follow-up with your primary physician in 1 week.  Follow-up with your cardiologist as scheduled by you.  Take time to change positions especially from lying down to standing up.   Increase activity slowly   Complete by: As directed       Allergies as of 09/22/2020   No Known Allergies      Medication List     STOP taking these medications    insulin lispro 100 UNIT/ML injection Commonly known as: HUMALOG       TAKE these medications     acetaminophen 500 MG tablet Commonly known as: TYLENOL Take 1,500 mg by mouth every 6 (six) hours as needed for moderate pain or headache.   Aspirin Low Dose 81 MG EC tablet Generic drug: aspirin Take 1 tablet (81 mg total) by mouth daily. Swallow whole.   atorvastatin 80 MG tablet Commonly known as: LIPITOR Take 1 tablet (80 mg total) by mouth daily.   Blood Pressure Monitor Devi Use as directed to check home blood pressure 2-3 times a week   digoxin 0.125 MG tablet Commonly known as: LANOXIN Take 1 tablet (0.125 mg total) by mouth daily.   ezetimibe 10 MG tablet Commonly known as: ZETIA Take 1 tablet (10 mg total) by mouth daily.   furosemide 20 MG tablet Commonly known as: LASIX Take 1 tablet (20 mg total) by mouth daily.   glipiZIDE 10 MG tablet Commonly known as: GLUCOTROL Take 1 tablet (10 mg total) by mouth daily before breakfast. To lower blood sugar   Insulin Syringe-Needle U-100 31G X 15/64" 0.3 ML Misc Commonly known as: ReliOn Insulin Syringe Use to inject insulin daily.   ivabradine 7.5 MG Tabs tablet Commonly known as: CORLANOR Take 1 tablet (7.5 mg total) by mouth 2 (two) times daily with a meal.   levETIRAcetam 750 MG tablet Commonly known as: KEPPRA Take 2 tablets (1,500 mg total) by mouth 2 (two) times daily.   losartan 25 MG tablet Commonly known as: COZAAR Take 1/2 tablet (12.5 mg total)  by mouth daily.   metoprolol 200 MG 24 hr tablet Commonly known as: TOPROL-XL Take 1 tablet (200 mg total) by mouth daily. Take with or immediately following a meal.   nitroGLYCERIN 0.4 MG SL tablet Commonly known as: NITROSTAT Place 1 tablet (0.4 mg total) under the tongue every 5 (five) minutes as needed for chest pain.   NovoLIN 70/30 FlexPen (70-30) 100 UNIT/ML KwikPen Generic drug: insulin isophane & regular human Inject 22 Units into the skin in the morning and at bedtime.   Oxcarbazepine 300 MG tablet Commonly known as: TRILEPTAL Take 2  tablets (600 mg total) by mouth 2 (two) times daily.   Pen Needles 31G X 8 MM Misc UAD   Pentips 32G X 4 MM Misc Generic drug: Insulin Pen Needle Use in the morning, at noon, and at bedtime.   phenytoin 100 MG ER capsule Commonly known as: DILANTIN Take 1 capsule (100 mg total) by mouth 3 (three) times daily.   spironolactone 25 MG tablet Commonly known as: ALDACTONE Take 1/2 tablet (12.5 mg total) by mouth daily.   True Metrix Meter w/Device Kit 1 each by Does not apply route 3 (three) times daily.   warfarin 5 MG tablet Commonly known as: Coumadin Take 1 tablet (5 mg total) by mouth daily.       ASK your doctor about these medications    glucose blood test strip Use as instructed   metFORMIN 500 MG tablet Commonly known as: GLUCOPHAGE Take 1 tablet (500 mg total) by mouth 2 (two) times daily with a meal. TAKE 1 TABLET BY MOUTH 2 TIMES DAILY WITH A MEAL        Follow-up Information     Ladell Pier, MD. Schedule an appointment as soon as possible for a visit in 1 week(s).   Specialty: Internal Medicine Contact information: [place] [phone]         Josue Hector, MD .   Specialty: Cardiology Contact information: [phone] [place] [address] [phone]         [facility] Follow up.   Specialty: Rehabilitation Why: The outpatient rehab should contact you for first appointment. if you have not heard from them by Wednesday please contact them. Contact information: [address] [place] [phone] [place] [address] [phone]                 Time coordinating discharge: 39 minutes  Signed:  Mada Sadik  [facility] 09/22/2020, 11:15 AM

## 2020-09-22 NOTE — Discharge Instructions (Addendum)
Nutrition Post Hospital Stay Proper nutrition can help your body recover from illness and injury.   Foods and beverages high in protein, vitamins, and minerals help rebuild muscle loss, promote healing, & reduce fall risk.   In addition to eating healthy foods, a nutrition shake is an easy, delicious way to get the nutrition you need during and after your hospital stay  It is recommended that you continue to drink 2 bottles per day of: Glucerna for at least 1 month (30 days) after your hospital stay and if you eat 50% or less of any meal.   Tips for adding a nutrition shake into your routine: As allowed, drink one with vitamins or medications instead of water or juice Enjoy one as a tasty mid-morning or afternoon snack Drink cold or make a milkshake out of it Drink one instead of milk with cereal or snacks Use as a coffee creamer   Available at the following grocery stores and pharmacies:           * Karin [employer] * [employer] * [employer]  * [facility]          * [employer] * [employer]  * [facility]      * Target  * [employer]   * [facility]  * [employer]   * [facility] [phone]            For COUPONS visit: www.[email or web address]/join or RoleLink.com.br   Suggested Substitutions Glucerna Shake = Boost Glucose Control = Carnation Breakfast Essentials SUGAR FREE  Carbohydrate Counting For People With Diabetes  Foods with carbohydrates make your blood glucose level go up. Learning how to count carbohydrates can help you control your blood glucose levels. First, identify the foods you eat that contain carbohydrates. Then, using the Foods with Carbohydrates chart, determine about how much carbohydrates are in your meals and snacks. Make sure you are eating foods with fiber, protein, and healthy fat along with your carbohydrate foods. Foods with Carbohydrates The following table shows carbohydrate foods that have about 15 grams of carbohydrate each. Using measuring cups, spoons,  or a food scale when you first begin learning about carbohydrate counting can help you learn about the portion sizes you typically eat. The following foods have 15 grams carbohydrate each:  Grains 1 slice bread (1 ounce)  1 small tortilla (6-inch size)   large bagel (1 ounce)  1/3 cup pasta or rice (cooked)   hamburger or hot dog bun ( ounce)   cup cooked cereal   to  cup ready-to-eat cereal  2 taco shells (5-inch size) Fruit 1 small fresh fruit ( to 1 cup)   medium banana  17 small grapes (3 ounces)  1 cup melon or berries   cup canned or frozen fruit  2 tablespoons dried fruit (blueberries, cherries, cranberries, raisins)   cup unsweetened fruit juice  Starchy Vegetables  cup cooked beans, peas, corn, potatoes/sweet potatoes   large baked potato (3 ounces)  1 cup acorn or butternut squash  Snack Foods 3 to 6 crackers  8 potato chips or 13 tortilla chips ( ounce to 1 ounce)  3 cups popped popcorn  Dairy 3/4 cup (6 ounces) nonfat plain yogurt, or yogurt with sugar-free sweetener  1 cup milk  1 cup plain rice, soy, coconut or flavored almond milk Sweets and Desserts  cup ice cream or frozen yogurt  1 tablespoon jam, jelly, pancake syrup, table sugar, or honey  2 tablespoons light pancake syrup  1  inch square of frosted cake or 2 inch square of unfrosted cake  2 small cookies (2/3 ounce each) or  large cookie  Sometimes you'll have to estimate carbohydrate amounts if you don't know the exact recipe. One cup of mixed foods like soups can have 1 to 2 carbohydrate servings, while some casseroles might have 2 or more servings of carbohydrate. Foods that have less than 20 calories in each serving can be counted as "free" foods. Count 1 cup raw vegetables, or  cup cooked non-starchy vegetables as "free" foods. If you eat 3 or more servings at one meal, then count them as 1 carbohydrate serving.  Foods without Carbohydrates  Not all foods contain carbohydrates. Meat, some  dairy, fats, non-starchy vegetables, and many beverages don't contain carbohydrate. So when you count carbohydrates, you can generally exclude chicken, pork, beef, fish, seafood, eggs, tofu, cheese, butter, sour cream, avocado, nuts, seeds, olives, mayonnaise, water, black coffee, unsweetened tea, and zero-calorie drinks. Vegetables with no or low carbohydrate include green beans, cauliflower, tomatoes, and onions. How much carbohydrate should I eat at each meal?  Carbohydrate counting can help you plan your meals and manage your weight. Following are some starting points for carbohydrate intake at each meal. Work with your registered dietitian nutritionist to find the best range that works for your blood glucose and weight.   To Lose Weight To Maintain Weight  Women 2 - 3 carb servings 3 - 4 carb servings  Men 3 - 4 carb servings 4 - 5 carb servings  Checking your blood glucose after meals will help you know if you need to adjust the timing, type, or number of carbohydrate servings in your meal plan. Achieve and keep a healthy body weight by balancing your food intake and physical activity.  Tips How should I plan my meals?  Plan for half the food on your plate to include non-starchy vegetables, like salad greens, broccoli, or carrots. Try to eat 3 to 5 servings of non-starchy vegetables every day. Have a protein food at each meal. Protein foods include chicken, fish, meat, eggs, or beans (note that beans contain carbohydrate). These two food groups (non-starchy vegetables and proteins) are low in carbohydrate. If you fill up your plate with these foods, you will eat less carbohydrate but still fill up your stomach. Try to limit your carbohydrate portion to  of the plate.  What fats are healthiest to eat?  Diabetes increases risk for heart disease. To help protect your heart, eat more healthy fats, such as olive oil, nuts, and avocado. Eat less saturated fats like butter, cream, and high-fat meats,  like bacon and sausage. Avoid trans fats, which are in all foods that list "partially hydrogenated oil" as an ingredient. What should I drink?  Choose drinks that are not sweetened with sugar. The healthiest choices are water, carbonated or seltzer waters, and tea and coffee without added sugars.  Sweet drinks will make your blood glucose go up very quickly. One serving of soda or energy drink is  cup. It is best to drink these beverages only if your blood glucose is low.  Artificially sweetened, or diet drinks, typically do not increase your blood glucose if they have zero calories in them. Read labels of beverages, as some diet drinks do have carbohydrate and will raise your blood glucose. Label Reading Tips Read Nutrition Facts labels to find out how many grams of carbohydrate are in a food you want to eat. Don't forget: sometimes serving sizes  on the label aren't the same as how much food you are going to eat, so you may need to calculate how much carbohydrate is in the food you are serving yourself.   Carbohydrate Counting for People with Diabetes Sample 1-Day Menu  Breakfast  cup yogurt, low fat, low sugar (1 carbohydrate serving)   cup cereal, ready-to-eat, unsweetened (1 carbohydrate serving)  1 cup strawberries (1 carbohydrate serving)   cup almonds ( carbohydrate serving)  Lunch 1, 5 ounce can chunk light tuna  2 ounces cheese, low fat cheddar  6 whole wheat crackers (1 carbohydrate serving)  1 small apple (1 carbohydrate servings)   cup carrots ( carbohydrate serving)   cup snap peas  1 cup 1% milk (1 carbohydrate serving)   Evening Meal Stir fry made with: 3 ounces chicken  1 cup brown rice (3 carbohydrate servings)   cup broccoli ( carbohydrate serving)   cup green beans   cup onions  1 tablespoon olive oil  2 tablespoons teriyaki sauce ( carbohydrate serving)  Evening Snack 1 extra small banana (1 carbohydrate serving)  1 tablespoon peanut butter    Carbohydrate Counting for People with Diabetes Vegan Sample 1-Day Menu  Breakfast 1 cup cooked oatmeal (2 carbohydrate servings)   cup blueberries (1 carbohydrate serving)  2 tablespoons flaxseeds  1 cup soymilk fortified with calcium and vitamin D  1 cup coffee  Lunch 2 slices whole wheat bread (2 carbohydrate servings)   cup baked tofu   cup lettuce  2 slices tomato  2 slices avocado   cup baby carrots ( carbohydrate serving)  1 orange (1 carbohydrate serving)  1 cup soymilk fortified with calcium and vitamin D   Evening Meal Burrito made with: 1 6-inch corn tortilla (1 carbohydrate serving)  1 cup refried vegetarian beans (2 carbohydrate servings)   cup chopped tomatoes   cup lettuce   cup salsa  1/3 cup brown rice (1 carbohydrate serving)  1 tablespoon olive oil for rice   cup zucchini   Evening Snack 6 small whole grain crackers (1 carbohydrate serving)  2 apricots ( carbohydrate serving)   cup unsalted peanuts ( carbohydrate serving)    Carbohydrate Counting for People with Diabetes Vegetarian (Lacto-Ovo) Sample 1-Day Menu  Breakfast 1 cup cooked oatmeal (2 carbohydrate servings)   cup blueberries (1 carbohydrate serving)  2 tablespoons flaxseeds  1 egg  1 cup 1% milk (1 carbohydrate serving)  1 cup coffee  Lunch 2 slices whole wheat bread (2 carbohydrate servings)  2 ounces low-fat cheese   cup lettuce  2 slices tomato  2 slices avocado   cup baby carrots ( carbohydrate serving)  1 orange (1 carbohydrate serving)  1 cup unsweetened tea  Evening Meal Burrito made with: 1 6-inch corn tortilla (1 carbohydrate serving)   cup refried vegetarian beans (1 carbohydrate serving)   cup tomatoes   cup lettuce   cup salsa  1/3 cup brown rice (1 carbohydrate serving)  1 tablespoon olive oil for rice   cup zucchini  1 cup 1% milk (1 carbohydrate serving)  Evening Snack 6 small whole grain crackers (1 carbohydrate serving)  2 apricots (  carbohydrate serving)   cup unsalted peanuts ( carbohydrate serving)    Copyright 2020  Academy of Nutrition and Dietetics. All rights reserved.  Using Nutrition Labels: Carbohydrate  Serving Size  Look at the serving size. All the information on the label is based on this portion. Servings Per Container  The number of servings  contained in the package. Guidelines for Carbohydrate  Look at the total grams of carbohydrate in the serving size.  1 carbohydrate choice = 15 grams of carbohydrate. Range of Carbohydrate Grams Per Choice  Carbohydrate Grams/Choice Carbohydrate Choices  6-10   11-20 1  21-25 1  26-35 2  36-40 2  41-50 3  51-55 3  56-65 4  66-70 4  71-80 5    Copyright 2020  Academy of Nutrition and Dietetics. All rights reserved.

## 2020-09-22 NOTE — Progress Notes (Signed)
ANTICOAGULATION CONSULT NOTE  Pharmacy Consult for Warfarin Indication: stroke and LV thrombus  No Known Allergies  Patient Measurements:   Weight: 64.5 kg  Vital Signs: Temp: 97.9 F (36.6 C) (06/10 0414) Temp Source: Oral (06/10 0414) BP: 118/86 (06/10 0414) Pulse Rate: 104 (06/10 0414)  Labs: Recent Labs    09/21/20 0551 09/21/20 0809 09/22/20 0503  HGB 12.5*  --  12.4*  HCT 39.6  --  38.6*  PLT 346  --  324  LABPROT  --  27.5* 31.4*  INR  --  2.6* 3.0*  CREATININE 0.95  --  0.92     Estimated Creatinine Clearance: 100.3 mL/min (by C-G formula based on SCr of 0.92 mg/dL).   Assessment: 35 y.o. 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 Pharmacy consulted to restart home warfarin for prior LV thrombus and new CVA.  INR therapeutic at 3 which is upper end of goal range. Patient seems to be sensitive to warfarin currently as compared to home dosing. Have not seen the effects of last night's dose so will reduce dose today. No bleeding noted, CBC stable.  PTA regimen: 5 mg daily  Goal of Therapy:  INR 2-3   Plan:  Warfarin 1 mg po x 1 today Monitor daily INR and signs and symptoms of bleeding.  Thank you for involving pharmacy in this patient's care.  Loura Back, PharmD, BCPS Clinical Pharmacist Clinical phone for 09/22/2020 until 3p is [phone] 09/22/2020 7:49 AM  **Pharmacist phone directory can be found on [email or web address] listed under [facility]**

## 2020-09-22 NOTE — Progress Notes (Signed)
Inpatient Diabetes Program Recommendations  AACE/ADA: New Consensus Statement on Inpatient Glycemic Control (2015)  Target Ranges:  Prepandial:   less than 140 mg/dL      Peak postprandial:   less than 180 mg/dL (1-2 hours)      Critically ill patients:  140 - 180 mg/dL   Lab Results  Component Value Date   GLUCAP 197 (H) 09/22/2020   HGBA1C >15.5 (H) 09/12/2020    Review of Glycemic Control  Diabetes history: type 2 Outpatient Diabetes medications:Novolin 70/30 22 units BID, Humalog 10-20 units TID (not taking), Glucotrol 10 mg daily, Metformin 500 mg BID Current orders for Inpatient glycemic control: Novolog MODERATE correction scale TID & HS scale  Inpatient Diabetes Program Recommendations:   Noted that patient was discharged on 09/19/2020. Inpatient diabetes team was following patient at that time. Noted that patient was discharged on Novolin 70/30 insulin 25 units BID. Patient was having low blood sugars at home and the insulin dose was decreased to 22 units BID.   Recommend discontinuing Glucotrol 10 mg at home and adjusting the insulin dosages  to prevent hypoglycemia.   Will continue to monitor blood sugars while in the hospital.  Smith Mince RN BSN CDE Diabetes Coordinator Pager: [phone]  8am-5pm

## 2020-09-22 NOTE — Telephone Encounter (Signed)
Spoke to Mrs. Mattioli today in regards to our scheduled home visit for her husband, however he is currently admitted. I advised her I would follow up after he is discharged home. Call complete.

## 2020-09-22 NOTE — Procedures (Signed)
Patient Name: Jason Stevenson  MRN: [id number]  Epilepsy Attending: Charlsie Quest  Referring Physician/Provider: Lanae Boast, NP Date: 09/21/2020 Duration: 24.11 mins  Patient history: 35 year old male with history of epilepsy presented with syncopal episode.  EEG to evaluate for seizures.  Level of alertness: Awake, asleep  AEDs during EEG study: Keppra, Dilantin  Technical aspects: This EEG study was done with scalp electrodes positioned according to the 10-20 International system of electrode placement. Electrical activity was acquired at a sampling rate of 500Hz  and reviewed with a high frequency filter of 70Hz  and a low frequency filter of 1Hz . EEG data were recorded continuously and digitally stored.   Description: The posterior dominant rhythm consists of 9-10 Hz activity of moderate voltage (25-35 uV) seen predominantly in posterior head regions, symmetric and reactive to eye opening and eye closing.  Sleep was characterized by vertex waves, sleep spindles (12 to 14 Hz), maximal frontocentral region. Physiologic photic driving was seen during photic stimulation. Hyperventilation was not performed.     IMPRESSION: This study is within normal limits. No seizures or epileptiform discharges were seen throughout the recording.  Jason Stevenson 

## 2020-09-22 NOTE — TOC Transition Note (Signed)
Transition of Care [facility]) - CM/SW Discharge Note   Patient Details  Name: Jason Stevenson MRN: [id number] Date of Birth: Mar 08, 1986  [facility] CM/SW Contact:  Kermit Balo, RN Phone Number: 09/22/2020, 11:37 AM   Clinical Narrative:    Patient is discharging home with same recommendations for outpatient therapy. CM resent orders to [facility].  No new medications for home.  Pt has hospital f/u appt.   Final next level of care: OP Rehab Barriers to Discharge: Inadequate or no insurance, Barriers Unresolved (comment)   Patient Goals and CMS Choice     Choice offered to / list presented to : Patient, Spouse  Discharge Placement                       Discharge Plan and Services                                     Social Determinants of Health (SDOH) Interventions     Readmission Risk Interventions Readmission Risk Prevention Plan 09/19/2020  Post Dischage Appt Complete  Transportation Screening Complete  Some recent data might be hidden

## 2020-09-25 ENCOUNTER — Telehealth: Payer: Self-pay

## 2020-09-25 ENCOUNTER — Ambulatory Visit (INDEPENDENT_AMBULATORY_CARE_PROVIDER_SITE_OTHER): Payer: Medicaid Other | Admitting: Nurse Practitioner

## 2020-09-25 VITALS — BP 97/80 | HR 107 | Temp 98.2°F | Resp 18

## 2020-09-25 DIAGNOSIS — I639 Cerebral infarction, unspecified: Secondary | ICD-10-CM

## 2020-09-25 DIAGNOSIS — Z8616 Personal history of COVID-19: Secondary | ICD-10-CM

## 2020-09-25 NOTE — Patient Instructions (Addendum)
Syncope Likely vasovagal during syncope.    Referral placed to neurology She was advised orthostatic precautions as well   CVA Referral placed to neurology   Hypertension Continue ARB (Cozaar) and BB (Toprol XL)    Hyperlipidemia -Continue Lipitor 80 mg daily, Zetia   DM type II Hemoglobin A1c of more than 15.   On 70/30 insulin at home.    Dietary modifications emphasized - 6 small meals per day - high protein / low carb    Chronic systolic CHF Previous 2D echocardiogram with left ventricular ejection fraction of 10 to 15% -Continue Digoxin, Ivabradine, Lasix, Aldactone.  Compliance emphasized.   On Coumadin for LV thrombus   Severe protein calorie malnutrition.   Present on admission.   Patient was advised oral intake    Follow up:  Follow up with PCP as scheduled 

## 2020-09-25 NOTE — Progress Notes (Signed)
_0  ID: Jason Stevenson, male    DOB: 09-20-1985, 35 y.o.   MRN: [id number]  Chief Complaint  Patient presents with  . Hospitalization Follow-up    Referring provider: Ladell Pier, MD  HPI  Patient presents today for transition of care visit.  Patient was originally admitted to the hospital on 09/11/2020.  He was admitted with CVA.  He was discharged and returned to the hospital on 09/21/2020 and was discharged on 09/22/2020 after a syncopal episode.  Patient does have a history of congestive heart failure and diabetes.  He has been noncompliant with his medications for quite some time.  He states that he has been compliant with medications since hospital discharge.  He states that he does have family support and they are going to have a family meeting to discuss his healthcare needs.  Since hospital discharge overall he has been doing well.  He was seen in the hospital for the following issues:  Syncope Likely vasovagal during syncope.  Neurology was consulted.  No concern for seizure.  MRI of the brain did not show any acute stroke but history of CVA.  She was advised orthostatic precautions as well.  EEG was unremarkable.   CVA Seen by neurology. Will need referral for outpatient follow up.    Hypertension Continued on ARB (Cozaar) and BB (Toprol XL)    Hyperlipidemia -Continue Lipitor 80 mg daily, Zetia   DM type II Hemoglobin A1c of more than 15.  On 70/30 insulin at home. Dietary modifications emphasized.   Chronic systolic CHF Previous 2D echocardiogram with left ventricular ejection fraction of 10 to 15% -Continue Digoxin, Ivabradine, Lasix, Aldactone.  Compliance emphasized.  On Coumadin for LV thrombus.   Severe protein calorie malnutrition.  Present on admission.  Patient was advised oral intake.   Disposition.  Appointments to be scheduled for outpatient PCP and cardiology follow-up as well as coumadin clinic and neurology.     No Known  Allergies  Immunization History  Administered Date(s) Administered  . Influenza,inj,Quad PF,6+ Mos 12/23/2013, 04/17/2016, 01/18/2019  . Pneumococcal Conjugate-13 12/23/2013  . Pneumococcal Polysaccharide-23 08/12/2017  . Tdap 08/12/2017    Past Medical History:  Diagnosis Date  . Chronic systolic CHF (congestive heart failure) ([place]) 06/18/2015  . Diabetes mellitus without complication ([place])   . Hemorrhagic stroke ([place])   . Hyperlipidemia   . Hypertension   . Nonischemic cardiomyopathy ([place]) Noted as early as 2007   Per chart review (cards consult note 2011), EF of 40% in 2007, down to 20-25% in 2011    Tobacco History: Social History   Tobacco Use  Smoking Status Never  Smokeless Tobacco Never   Counseling given: Yes   Outpatient Encounter Medications as of 09/25/2020  Medication Sig  . acetaminophen (TYLENOL) 500 MG tablet Take 1,500 mg by mouth every 6 (six) hours as needed for moderate pain or headache.  Marland Kitchen aspirin 81 MG EC tablet Take 1 tablet (81 mg total) by mouth daily. Swallow whole.  Marland Kitchen atorvastatin (LIPITOR) 80 MG tablet Take 1 tablet (80 mg total) by mouth daily.  . Blood Glucose Monitoring Suppl (TRUE METRIX METER) w/Device KIT 1 each by Does not apply route 3 (three) times daily.  . Blood Pressure Monitor DEVI Use as directed to check home blood pressure 2-3 times a week  . digoxin (LANOXIN) 0.125 MG tablet Take 1 tablet (0.125 mg total) by mouth daily.  Marland Kitchen ezetimibe (ZETIA) 10 MG tablet Take 1 tablet (10 mg total) by mouth daily.  Marland Kitchen  furosemide (LASIX) 20 MG tablet Take 1 tablet (20 mg total) by mouth daily.  Marland Kitchen glipiZIDE (GLUCOTROL) 10 MG tablet Take 1 tablet (10 mg total) by mouth daily before breakfast. To lower blood sugar  . glucose blood test strip Use as instructed (Patient taking differently: Use as instructed 2-3 times per day)  . insulin isophane & regular human (NOVOLIN 70/30 FLEXPEN) (70-30) 100 UNIT/ML KwikPen Inject 22 Units into the skin in the  morning and at bedtime.  . Insulin Pen Needle (PEN NEEDLES) 31G X 8 MM MISC UAD  . Insulin Pen Needle 32G X 4 MM MISC Use in the morning, at noon, and at bedtime.  . Insulin Syringe-Needle U-100 (RELION INSULIN SYRINGE) 31G X 15/64" 0.3 ML MISC Use to inject insulin daily.  . ivabradine (CORLANOR) 7.5 MG TABS tablet Take 1 tablet (7.5 mg total) by mouth 2 (two) times daily with a meal.  . levETIRAcetam (KEPPRA) 750 MG tablet Take 2 tablets (1,500 mg total) by mouth 2 (two) times daily.  Marland Kitchen losartan (COZAAR) 25 MG tablet Take 1/2 tablet (12.5 mg total) by mouth daily.  . metFORMIN (GLUCOPHAGE) 500 MG tablet Take 1 tablet (500 mg total) by mouth 2 (two) times daily with a meal. TAKE 1 TABLET BY MOUTH 2 TIMES DAILY WITH A MEAL (Patient taking differently: Take 500 mg by mouth 2 (two) times daily with a meal.)  . metoprolol (TOPROL-XL) 200 MG 24 hr tablet Take 1 tablet (200 mg total) by mouth daily. Take with or immediately following a meal.  . nitroGLYCERIN (NITROSTAT) 0.4 MG SL tablet Place 1 tablet (0.4 mg total) under the tongue every 5 (five) minutes as needed for chest pain.  . Oxcarbazepine (TRILEPTAL) 300 MG tablet Take 2 tablets (600 mg total) by mouth 2 (two) times daily.  . phenytoin (DILANTIN) 100 MG ER capsule Take 1 capsule (100 mg total) by mouth 3 (three) times daily.  Marland Kitchen spironolactone (ALDACTONE) 25 MG tablet Take 1/2 tablet (12.5 mg total) by mouth daily.  Marland Kitchen warfarin (COUMADIN) 5 MG tablet Take 1 tablet (5 mg total) by mouth daily.   No facility-administered encounter medications on file as of 09/25/2020.     Review of Systems  Review of Systems  Constitutional: Negative.  Negative for fatigue.  HENT: Negative.    Respiratory:  Negative for cough and shortness of breath.   Cardiovascular: Negative.   Gastrointestinal: Negative.   Allergic/Immunologic: Negative.   Neurological: Negative.   Psychiatric/Behavioral: Negative.        Physical Exam  BP 97/80   Pulse (!) 107    Temp 98.2 F (36.8 C)   Resp 18   SpO2 95%   Wt Readings from Last 5 Encounters:  09/22/20 142 lb 3.2 oz (64.5 kg)  09/13/20 142 lb 4.8 oz (64.5 kg)  12/02/19 167 lb (75.8 kg)  08/06/19 167 lb (75.8 kg)  04/26/19 167 lb 9.6 oz (76 kg)     Physical Exam Vitals and nursing note reviewed.  Constitutional:      General: He is not in acute distress.    Appearance: He is well-developed.  Cardiovascular:     Rate and Rhythm: Normal rate and regular rhythm.  Pulmonary:     Effort: Pulmonary effort is normal.     Breath sounds: Normal breath sounds.  Skin:    General: Skin is warm and dry.  Neurological:     Mental Status: He is alert and oriented to person, place, and time.  Imaging: CT ANGIO HEAD NECK W WO CM  Result Date: 09/12/2020 CLINICAL DATA:  Stroke follow-up. EXAM: CT ANGIOGRAPHY HEAD AND NECK TECHNIQUE: Multidetector CT imaging of the head and neck was performed using the standard protocol during bolus administration of intravenous contrast. Multiplanar CT image reconstructions and MIPs were obtained to evaluate the vascular anatomy. Carotid stenosis measurements (when applicable) are obtained utilizing NASCET criteria, using the distal internal carotid diameter as the denominator. CONTRAST:  63m OMNIPAQUE IOHEXOL 350 MG/ML SOLN COMPARISON:  Head CT and MRI 09/11/2020 FINDINGS: CT HEAD FINDINGS Brain: Hypodensity in the right frontal operculum corresponds to the subacute infarct on MRI. The suspected small subacute left parieto-occipital infarct on MRI is not well demonstrated by CT. A chronic right parietal infarct is again noted. No acute intracranial hemorrhage, mass, midline shift, or extra-axial fluid collection is identified. The ventricles are normal in size. Vascular: No hyperdense vessel. Skull: No fracture suspicious osseous lesion. Sinuses: Visualized paranasal sinuses and mastoid air cells are clear. Orbits: Visualized portions of the orbits are unremarkable.  Review of the MIP images confirms the above findings CTA NECK FINDINGS Aortic arch: Standard 3 vessel aortic arch with widely patent arch vessel origins. Right carotid system: Patent without evidence of stenosis, dissection, or significant atherosclerosis. Left carotid system: Patent without evidence of stenosis, dissection, or significant atherosclerosis. Vertebral arteries: Patent without evidence of stenosis, dissection, or significant atherosclerosis. Strongly dominant right vertebral artery. Skeleton: No acute osseous abnormality or suspicious osseous lesion. Other neck: No evidence of cervical lymphadenopathy or mass. Upper chest: Clear lung apices. Review of the MIP images confirms the above findings CTA HEAD FINDINGS Anterior circulation: The internal carotid arteries are widely patent from skull base to carotid termini. ACAs and MCAs are patent without evidence of a proximal branch occlusion or significant proximal stenosis. There is mild attenuation of distal right MCA branch vessels in the region of the chronic right parietal infarct. No aneurysm is identified. Posterior circulation: The intracranial vertebral arteries are patent to the basilar with the left vertebral artery being markedly hypoplastic distal to the PICA origin. The left PICA and right AICA appear dominant. Patent SCAs are seen bilaterally. The basilar artery is widely patent. There are patent posterior communicating arteries bilaterally. Both PCAs are patent without evidence of a significant proximal stenosis. No aneurysm is identified. Venous sinuses: Patent. Anatomic variants: None. Review of the MIP images confirms the above findings IMPRESSION: No large vessel occlusion or significant proximal stenosis in the head and neck. Electronically Signed   By: ALogan BoresM.D.   On: 09/12/2020 10:42   CT HEAD WO CONTRAST  Result Date: 09/21/2020 CLINICAL DATA:  Altered mental status EXAM: CT HEAD WITHOUT CONTRAST TECHNIQUE: Contiguous  axial images were obtained from the base of the skull through the vertex without intravenous contrast. COMPARISON:  Three days ago FINDINGS: Brain: Remote right MCA branch infarcts affecting parietal and frontal lobes. Small remote left occipital parietal cortex infarct. New low-density in the low and parasagittal left cerebellum. Although the posterior fossa is usually affected by streak artifact the low-density is very discrete and favored to be a true finding. No hemorrhage, hydrocephalus, or masslike finding. Vascular: No hyperdense vessel or unexpected calcification. Skull: Normal. Negative for fracture or focal lesion. Sinuses/Orbits: Negative IMPRESSION: 1. New low-density in the left cerebellum suggesting acute infarct. 2. Remote right more than left cerebral infarcts. Electronically Signed   By: JMonte FantasiaM.D.   On: 09/21/2020 06:50   CT Head Wo Contrast  Result Date: 09/11/2020 CLINICAL DATA:  35 year old male with seizure. EXAM: CT HEAD WITHOUT CONTRAST TECHNIQUE: Contiguous axial images were obtained from the base of the skull through the vertex without intravenous contrast. COMPARISON:  Head CT dated 11/15/2009. FINDINGS: Brain: Focal area of old infarct and encephalomalacia involving the right posterior parietal lobe. Additional area of infarct in the right frontal lobe may be subacute. Further evaluation with MRI is recommended. The ventricles and sulci appropriate size for patient's age. There is no acute intracranial hemorrhage. No mass effect midline shift no extra-axial fluid collection. Vascular: No hyperdense vessel or unexpected calcification. Skull: Normal. Negative for fracture or focal lesion. Sinuses/Orbits: No acute finding. Other: None IMPRESSION: 1. No acute intracranial hemorrhage. 2. Probable subacute infarct in the right frontal lobe. Further evaluation with MRI is recommended. Old right parietal infarct. Electronically Signed   By: Anner Crete M.D.   On: 09/11/2020  21:44   MR ANGIO HEAD WO CONTRAST  Result Date: 09/21/2020 CLINICAL DATA:  Neuro deficit, acute, stroke suspected. EXAM: MRI HEAD WITHOUT CONTRAST MRA HEAD WITHOUT CONTRAST MRA NECK WITHOUT CONTRAST TECHNIQUE: Multiplanar, multi-echo pulse sequences of the brain and surrounding structures were acquired without intravenous contrast. Angiographic images of the Circle of Willis were acquired using MRA technique without intravenous contrast. Angiographic images of the neck were acquired using MRA technique without and with intravenous contrast. Carotid stenosis measurements (when applicable) are obtained utilizing NASCET criteria, using the distal internal carotid diameter as the denominator. COMPARISON:  Head CT September 21, 2020; head MRI September 18, 2020; CT angiogram of the head and neck Sep 12, 2020. FINDINGS: MRI HEAD FINDINGS Brain: Areas of encephalomalacia and gliosis in the right frontal lobe and right temporoparietal region with susceptibility artifact consistent with hemosiderin deposits are again seen. Cortical increased T2 signal with T2 shine through on the diffusion-weighted sequence is seen in the right frontal lobe, similar to prior MRI. There is also no significant change of the T2 hyperintensity involving the bilateral basal ganglia and right radiating white matter tracts. Susceptibility artifact in the bilateral occipital lobes are also unchanged. Remote lacunar infarct in the right cerebellar hemisphere. No evidence of an acute infarct, acute hemorrhage, hydrocephalus, extra-axial collection or mass lesion. Vascular: Normal flow voids. Skull and upper cervical spine: Normal marrow signal. Sinuses/Orbits: Mild mucosal thickening of the ethmoid cells. The orbits are maintained. MRA HEAD FINDINGS Anterior circulation: The visualized portions of the distal cervical and intracranial internal carotid arteries are widely patent with normal flow related enhancement. The bilateral anterior cerebral arteries and  middle cerebral arteries are widely patent with antegrade flow without high-grade flow-limiting stenosis or proximal branch occlusion. No intracranial aneurysm within the anterior circulation. Posterior circulation: The vertebral arteries are widely patent with antegrade flow. The posterior inferior cerebral arteries are normal. Vertebrobasilar junction and basilar artery are widely patent with antegrade flow without evidence of basilar stenosis or aneurysm. Posterior cerebral arteries are normal bilaterally. No intracranial aneurysm within the posterior circulation. Anatomic variants: Hypoplastic left vertebral artery. MRA NECK FINDINGS Aortic arch:  Not included. Right carotid system: Normal course and caliber without stenosis or evidence of dissection. Left carotid system: Normal course and caliber without stenosis or evidence of dissection. Vertebral arteries: Right dominant with hypoplastic left vertebral artery. Vertebral artery origins are normal. Vertebral arteries are normal in course and caliber to the vertebrobasilar confluence without stenosis or evidence of dissection. Other: None IMPRESSION: 1. No acute intracranial abnormality. No significant change from prior MRI. 2. Increased T2 signal  in the right frontal cortex, bilateral basal ganglia and radiating white matter tracts appear unchanged. Differential considerations remain the same including post ictal and metabolic derangement. 3. Unremarkable MR angiogram of the head and neck. Electronically Signed   By: Pedro Earls M.D.   On: 09/21/2020 15:53   MR ANGIO NECK WO CONTRAST  Result Date: 09/21/2020 CLINICAL DATA:  Neuro deficit, acute, stroke suspected. EXAM: MRI HEAD WITHOUT CONTRAST MRA HEAD WITHOUT CONTRAST MRA NECK WITHOUT CONTRAST TECHNIQUE: Multiplanar, multi-echo pulse sequences of the brain and surrounding structures were acquired without intravenous contrast. Angiographic images of the Circle of Willis were acquired  using MRA technique without intravenous contrast. Angiographic images of the neck were acquired using MRA technique without and with intravenous contrast. Carotid stenosis measurements (when applicable) are obtained utilizing NASCET criteria, using the distal internal carotid diameter as the denominator. COMPARISON:  Head CT September 21, 2020; head MRI September 18, 2020; CT angiogram of the head and neck Sep 12, 2020. FINDINGS: MRI HEAD FINDINGS Brain: Areas of encephalomalacia and gliosis in the right frontal lobe and right temporoparietal region with susceptibility artifact consistent with hemosiderin deposits are again seen. Cortical increased T2 signal with T2 shine through on the diffusion-weighted sequence is seen in the right frontal lobe, similar to prior MRI. There is also no significant change of the T2 hyperintensity involving the bilateral basal ganglia and right radiating white matter tracts. Susceptibility artifact in the bilateral occipital lobes are also unchanged. Remote lacunar infarct in the right cerebellar hemisphere. No evidence of an acute infarct, acute hemorrhage, hydrocephalus, extra-axial collection or mass lesion. Vascular: Normal flow voids. Skull and upper cervical spine: Normal marrow signal. Sinuses/Orbits: Mild mucosal thickening of the ethmoid cells. The orbits are maintained. MRA HEAD FINDINGS Anterior circulation: The visualized portions of the distal cervical and intracranial internal carotid arteries are widely patent with normal flow related enhancement. The bilateral anterior cerebral arteries and middle cerebral arteries are widely patent with antegrade flow without high-grade flow-limiting stenosis or proximal branch occlusion. No intracranial aneurysm within the anterior circulation. Posterior circulation: The vertebral arteries are widely patent with antegrade flow. The posterior inferior cerebral arteries are normal. Vertebrobasilar junction and basilar artery are widely patent  with antegrade flow without evidence of basilar stenosis or aneurysm. Posterior cerebral arteries are normal bilaterally. No intracranial aneurysm within the posterior circulation. Anatomic variants: Hypoplastic left vertebral artery. MRA NECK FINDINGS Aortic arch:  Not included. Right carotid system: Normal course and caliber without stenosis or evidence of dissection. Left carotid system: Normal course and caliber without stenosis or evidence of dissection. Vertebral arteries: Right dominant with hypoplastic left vertebral artery. Vertebral artery origins are normal. Vertebral arteries are normal in course and caliber to the vertebrobasilar confluence without stenosis or evidence of dissection. Other: None IMPRESSION: 1. No acute intracranial abnormality. No significant change from prior MRI. 2. Increased T2 signal in the right frontal cortex, bilateral basal ganglia and radiating white matter tracts appear unchanged. Differential considerations remain the same including post ictal and metabolic derangement. 3. Unremarkable MR angiogram of the head and neck. Electronically Signed   By: Pedro Earls M.D.   On: 09/21/2020 15:53   MR BRAIN WO CONTRAST  Result Date: 09/21/2020 CLINICAL DATA:  Neuro deficit, acute, stroke suspected. EXAM: MRI HEAD WITHOUT CONTRAST MRA HEAD WITHOUT CONTRAST MRA NECK WITHOUT CONTRAST TECHNIQUE: Multiplanar, multi-echo pulse sequences of the brain and surrounding structures were acquired without intravenous contrast. Angiographic images of the Circle of Willis  were acquired using MRA technique without intravenous contrast. Angiographic images of the neck were acquired using MRA technique without and with intravenous contrast. Carotid stenosis measurements (when applicable) are obtained utilizing NASCET criteria, using the distal internal carotid diameter as the denominator. COMPARISON:  Head CT September 21, 2020; head MRI September 18, 2020; CT angiogram of the head and neck Sep 12, 2020. FINDINGS: MRI HEAD FINDINGS Brain: Areas of encephalomalacia and gliosis in the right frontal lobe and right temporoparietal region with susceptibility artifact consistent with hemosiderin deposits are again seen. Cortical increased T2 signal with T2 shine through on the diffusion-weighted sequence is seen in the right frontal lobe, similar to prior MRI. There is also no significant change of the T2 hyperintensity involving the bilateral basal ganglia and right radiating white matter tracts. Susceptibility artifact in the bilateral occipital lobes are also unchanged. Remote lacunar infarct in the right cerebellar hemisphere. No evidence of an acute infarct, acute hemorrhage, hydrocephalus, extra-axial collection or mass lesion. Vascular: Normal flow voids. Skull and upper cervical spine: Normal marrow signal. Sinuses/Orbits: Mild mucosal thickening of the ethmoid cells. The orbits are maintained. MRA HEAD FINDINGS Anterior circulation: The visualized portions of the distal cervical and intracranial internal carotid arteries are widely patent with normal flow related enhancement. The bilateral anterior cerebral arteries and middle cerebral arteries are widely patent with antegrade flow without high-grade flow-limiting stenosis or proximal branch occlusion. No intracranial aneurysm within the anterior circulation. Posterior circulation: The vertebral arteries are widely patent with antegrade flow. The posterior inferior cerebral arteries are normal. Vertebrobasilar junction and basilar artery are widely patent with antegrade flow without evidence of basilar stenosis or aneurysm. Posterior cerebral arteries are normal bilaterally. No intracranial aneurysm within the posterior circulation. Anatomic variants: Hypoplastic left vertebral artery. MRA NECK FINDINGS Aortic arch:  Not included. Right carotid system: Normal course and caliber without stenosis or evidence of dissection. Left carotid system: Normal  course and caliber without stenosis or evidence of dissection. Vertebral arteries: Right dominant with hypoplastic left vertebral artery. Vertebral artery origins are normal. Vertebral arteries are normal in course and caliber to the vertebrobasilar confluence without stenosis or evidence of dissection. Other: None IMPRESSION: 1. No acute intracranial abnormality. No significant change from prior MRI. 2. Increased T2 signal in the right frontal cortex, bilateral basal ganglia and radiating white matter tracts appear unchanged. Differential considerations remain the same including post ictal and metabolic derangement. 3. Unremarkable MR angiogram of the head and neck. Electronically Signed   By: Pedro Earls M.D.   On: 09/21/2020 15:53   MR BRAIN WO CONTRAST  Addendum Date: 09/18/2020   ADDENDUM REPORT: 09/18/2020 16:27 ADDENDUM: CORRECTION: The abnormal signal in conclusion #1 and the correlate findings is in the right frontal and left parieto-occipital lobes, not the left frontal lobe. Findings in the report and this addendum discussed with Desiree Metzger-Cihelka (NP) via telephone at 3:58 p.m. Electronically Signed   By: Margaretha Sheffield MD   On: 09/18/2020 16:27   Result Date: 09/18/2020 CLINICAL DATA:  Seizure, nontraumatic. EXAM: MRI HEAD WITHOUT CONTRAST TECHNIQUE: Multiplanar, multiecho pulse sequences of the brain and surrounding structures were obtained without intravenous contrast. COMPARISON:  Sep 11, 2020. FINDINGS: Brain: No substantial change in T2/FLAIR hyperintensity in the cortical and subcortical right frontal and left frontal lobes, which demonstrated amorphous enhancement on the recent prior MRI. Faint susceptibility artifact in these regions likely represents petechial hemorrhage. Remote right parietal infarct with encephalomalacia and surrounding gliosis. Persistent mild DWI hyperintensity  in the surrounding frontal cortex and right insula in regions of previously seen  enhancement. Signal abnormality in the basal ganglia has improved slightly with persistent abnormal signal in the white matter tracts. No extra-axial fluid collection. No midline shift. Basal cisterns are patent. Vascular: Major arterial flow voids are maintained at the skull base. Small left vertebral artery. Skull and upper cervical spine: Normal marrow signal. Sinuses/Orbits: Clear sinuses.  Unremarkable orbits. Other: No mastoid effusions. IMPRESSION: 1. No substantial change in T2/FLAIR hyperintensity in the cortical and subcortical right frontal and left frontal lobes, which demonstrated amorphous enhancement on the recent prior MRI. In the setting of the patient's known cardiac risk factors, these areas most likely represent evolving subacute infarcts. There is evidence of petechial hemorrhage in these areas without mass occupying hemorrhagic transformation. Additional remote right parietal infarct is unchanged. 2. Persistent mild DWI hyperintensity in the surrounding right frontal cortex and right insula in regions of previously seen enhancement with faint associated edema. Abnormal signal in the basal ganglia and white matter tracks may be improved slightly, but is persistent. While nonspecific, findings are thought most likely to be the sequela of seizures given seizure activity on recent EEGs. Encephalitis and/or toxic/metabolic encephalopathy are differential considerations. 3. Recommend short interval follow-up MRI with contrast to ensure resolution of the above findings and exclude other etiologies. Electronically Signed: By: Margaretha Sheffield MD On: 09/18/2020 15:28   MR Brain W and Wo Contrast  Result Date: 09/12/2020 CLINICAL DATA:  Initial evaluation for acute neuro deficit, altered mental status. EXAM: MRI HEAD WITHOUT AND WITH CONTRAST TECHNIQUE: Multiplanar, multiecho pulse sequences of the brain and surrounding structures were obtained without and with intravenous contrast. CONTRAST:  7.24m  GADAVIST GADOBUTROL 1 MMOL/ML IV SOLN COMPARISON:  Prior head CT from earlier the same day. FINDINGS: Brain: Cerebral volume within normal limits for age. Area of encephalomalacia and gliosis involving the right parietal region consistent with a chronic ischemic infarct. Associated mild chronic hemosiderin staining at this location. There is an additional area of abnormal T2/FLAIR signal abnormality involving the cortical and subcortical aspect of the anterior right frontal lobe. Associated susceptibility artifact consistent with blood products. No visible significant DWI signal. Area does demonstrate heterogeneous postcontrast gyriform diffusion abnormality within the adjacent right frontotemporal region with associated enhancement (series 20, image 6), favored to reflect changes related to superimposed seizure. Possible ischemic change could be conceivably be considered as well. There is an additional area of cortical signal abnormality involving the left parieto-occipital cortex with associated susceptibility artifact and enhancement, favored to reflect an additional subacute ischemic infarct (series 18, image 32). Note made of an additional probable tiny remote right cerebellar infarct (series 10, image 6). Additionally, there is abnormal symmetric increased T2/FLAIR signal abnormality involving the caudate and lentiform nuclei bilaterally (series 11, image 13). Finding is nonspecific, but it is most suggestive of superimposed/concomitant toxic metabolic derangement or other toxic insult. No associated enhancement or hemorrhage. Suspected patchy involvement of the radiating deep white matter tracts (series 11, image 16). No other evidence for acute or subacute ischemia gray-white matter differentiation otherwise maintained. No other mass lesion, mass effect, or midline shift. No hydrocephalus or extra-axial fluid collection. Pituitary gland suprasellar region normal. Midline structures intact and normal. No  other definite abnormal enhancement. Note made of an apparent small focus of enhancement within the right cerebellum on coronal postcontrast sequence (series 19, image 5), favored to be vascular in nature. Vascular: Major intracranial vascular flow voids are maintained. Skull and upper  cervical spine: Craniocervical junction within normal limits. Bone marrow signal intensity normal. No scalp soft tissue abnormality. Sinuses/Orbits: Globes and orbital soft tissues within normal limits. Paranasal sinuses are clear. No mastoid effusion. Inner ear structures grossly normal. Other: None. IMPRESSION: 1. Probable subacute hemorrhagic infarcts involving the anterior right frontal lobe and left parieto-occipital cortex as above, with additional chronic right parietal infarct. 2. Superimposed gyriform diffusion abnormality and enhancement involving the right frontotemporal region, favored to reflect changes of acute seizure. Correlation with EEG recommended. Possible ischemic change would be the primary differential consideration. 3. Abnormal symmetric T2/FLAIR signal abnormality involving the caudate and lentiform nuclei bilaterally, nonspecific, but most suggestive of superimposed/concomitant toxic metabolic derangement or other toxic insult. Correlation with history and laboratory values recommended. 4. A short interval follow-up brain MRI to ensure that these changes resolve is recommended. Electronically Signed   By: Jeannine Boga M.D.   On: 09/12/2020 00:43   EEG adult  Result Date: 09/22/2020 Lora Havens, MD     09/22/2020  8:44 AM Patient Name: Jason Stevenson MRN: [id number] Epilepsy Attending: Lora Havens Referring Physician/Provider: Anibal Henderson, NP Date: 09/21/2020 Duration: 24.11 mins Patient history: 35 year old male with history of epilepsy presented with syncopal episode.  EEG to evaluate for seizures. Level of alertness: Awake, asleep AEDs during EEG study: Keppra, Dilantin Technical  aspects: This EEG study was done with scalp electrodes positioned according to the 10-20 International system of electrode placement. Electrical activity was acquired at a sampling rate of _0  and reviewed with a high frequency filter of _1  and a low frequency filter of _2 . EEG data were recorded continuously and digitally stored. Description: The posterior dominant rhythm consists of 9-10 Hz activity of moderate voltage (25-35 uV) seen predominantly in posterior head regions, symmetric and reactive to eye opening and eye closing.  Sleep was characterized by vertex waves, sleep spindles (12 to 14 Hz), maximal frontocentral region. Physiologic photic driving was seen during photic stimulation. Hyperventilation was not performed.   IMPRESSION: This study is within normal limits. No seizures or epileptiform discharges were seen throughout the recording. Lora Havens   EEG adult  Result Date: 09/12/2020 Lora Havens, MD     09/12/2020  8:37 AM Patient Name: Leonidus Rowand MRN: [id number] Epilepsy Attending: Lora Havens Referring Physician/Provider: Dr Derrick Ravel Date: 09/12/2020 Duration: 23.58 mins Patient history: 35 year old male presenting with new onset of left sided twitching, left facial droop and severe hyperglycemia. EEG to evaluate for seizure. Level of alertness: Awake, drowsy AEDs during EEG study: None Technical aspects: This EEG study was done with scalp electrodes positioned according to the 10-20 International system of electrode placement. Electrical activity was acquired at a sampling rate of _3  and reviewed with a high frequency filter of _4  and a low frequency filter of _5 . EEG data were recorded continuously and digitally stored. Description: The posterior dominant rhythm consists of 8-9 Hz activity of moderate voltage (25-35 uV) seen predominantly in posterior head regions, symmetric and reactive to eye opening and eye closing. Drowsiness was characterized by  attenuation of the posterior background rhythm and roving eye movements.  Hyperventilation and photic stimulation were not performed.   IMPRESSION: This study is within normal limits. No seizures or epileptiform discharges were seen throughout the recording. Priyanka Barbra Sarks   Overnight EEG with video  Result Date: 09/13/2020 Lora Havens, MD     09/14/2020  9:38 AM Patient Name: Yahir Tavano MRN: [id number] Epilepsy Attending: Cecil Cranker  Hortense Ramal Referring Physician/Provider: Dr Derrick Ravel Duration: 09/12/2020 1901 to 09/13/2020 1901   Patient history: 35 year old male presenting with new onset of left sided twitching, left facial droop and severe hyperglycemia. EEG to evaluate for seizure.   Level of alertness: Awake, asleep   AEDs during EEG study: LEV, PHT   Technical aspects: This EEG study was done with scalp electrodes positioned according to the 10-20 International system of electrode placement. Electrical activity was acquired at a sampling rate of _0  and reviewed with a high frequency filter of _1  and a low frequency filter of _2 . EEG data were recorded continuously and digitally stored.   Description: The posterior dominant rhythm consists of 8-9 Hz activity of moderate voltage (25-35 uV) seen predominantly in posterior head regions, symmetric and reactive to eye opening and eye closing.  Sleep was characterized by orbicularis, sleep spindles (12 to 14 Hz), maximum frontocentral region.  Intermittent sharply contoured 2 to 3 Hz delta slowing was also noted in left frontotemporal region. Seizures without clinical signs were seen arising from right temporo-parietal region, average 4-5/hour, average duration about 30 seconds.  After fosphenytoin was added, frequency of seizures improved, last seizure noted on 09/13/2020 at 1513. hyperventilation and photic stimulation were not performed.   ABNORMALITY -Seizure without clinical signs, right temporo-parietal region -Intermittent slow, left  frontotemporal region   IMPRESSION: This study showed seizures without clinical signs arising from right temporo-parietal region, average 4-5/hour, average duration about 30 seconds.  Frequency of seizures improved after fosphenytoin was added, last seizure noted on  09/13/2020 at 1513. There is also evidence of nonspecific cortical dysfunction in left frontotemporal region. Dr. Cheral Marker and Dr. Erlinda Hong were intermittently notified   Lora Havens   ECHOCARDIOGRAM COMPLETE  Result Date: 09/13/2020    ECHOCARDIOGRAM REPORT   Patient Name:   RAYBON CONARD Date of Exam: 09/13/2020 Medical Rec #:  [id number]         Height:       71.0 in Accession #:    [id number]        Weight:       167.0 lb Date of Birth:  05-06-1985          BSA:          1.953 m Patient Age:    17 years          BP:           102/79 mmHg Patient Gender: M                 HR:           111 bpm. Exam Location:  Inpatient Procedure: 2D Echo, 3D Echo, Cardiac Doppler, Color Doppler and Intracardiac            Opacification Agent REPORT CONTAINS CRITICAL RESULT Indications:    Stroke  History:        Patient has prior history of Echocardiogram examinations.                 Cardiomyopathy and CHF, Abnormal ECG, Stroke; Risk                 Factors:Hypertension, Diabetes and Dyslipidemia.  Sonographer:    Roseanna Rainbow RDCS Referring Phys: [id number] [facility]  Sonographer Comments: Technically difficult study due to poor echo windows. Patient supine on EEG monitor. Patient started to have siezure like activity during image 104. RN notified. IMPRESSIONS  1. 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  2. Left ventricular ejection fraction, by estimation, is 10-15%. The left ventricle has severely decreased function. The left ventricle demonstrates global hypokinesis. The left ventricular internal cavity size was mildly dilated. Left ventricular diastolic parameters are indeterminate.  3. Right ventricular systolic function is moderately reduced. The  right ventricular size is normal. Tricuspid regurgitation signal is inadequate for assessing PA pressure.  4. Left atrial size was mildly dilated.  5. The mitral valve is grossly normal. Mild mitral valve regurgitation. No evidence of mitral stenosis.  6. The aortic valve is tricuspid. Aortic valve regurgitation is not visualized. No aortic stenosis is present.  7. The inferior vena cava is dilated in size with <50% respiratory variability, suggesting right atrial pressure of 15 mmHg. Conclusion(s)/Recommendation(s): Critical findings reported to Dr. Ander Purpura and acknowledged at 9:15 am 09/13/20. FINDINGS  Left Ventricle: Left ventricular ejection fraction, by estimation, is 10-15%. The left ventricle has severely decreased function. The left ventricle demonstrates global hypokinesis. Definity contrast agent was given IV to delineate the left ventricular endocardial borders. The left ventricular internal cavity size was mildly dilated. There is no left ventricular hypertrophy. Left ventricular diastolic parameters are indeterminate. Right Ventricle: The right ventricular size is normal. No increase in right ventricular wall thickness. Right ventricular systolic function is moderately reduced. Tricuspid regurgitation signal is inadequate for assessing PA pressure. Left Atrium: Left atrial size was mildly dilated. Right Atrium: Right atrial size was normal in size. Pericardium: There is no evidence of pericardial effusion. Mitral Valve: The mitral valve is grossly normal. Mild mitral valve regurgitation. No evidence of mitral valve stenosis. Tricuspid Valve: The tricuspid valve is normal in structure. Tricuspid valve regurgitation is trivial. No evidence of tricuspid stenosis. Aortic Valve: The aortic valve is tricuspid. Aortic valve regurgitation is not visualized. No aortic stenosis is present. Pulmonic Valve: The pulmonic valve was normal in structure. Pulmonic valve regurgitation is not visualized. No evidence of  pulmonic stenosis. Aorta: The aortic root is normal in size and structure. Venous: The inferior vena cava is dilated in size with less than 50% respiratory variability, suggesting right atrial pressure of 15 mmHg. IAS/Shunts: No atrial level shunt detected by color flow Doppler.  LEFT VENTRICLE PLAX 2D LVIDd:         6.00 cm LVIDs:         5.50 cm LV PW:         0.90 cm LV IVS:        0.90 cm LVOT diam:     1.90 cm LV SV:         30 LV SV Index:   16 LVOT Area:     2.84 cm  LV Volumes (MOD) LV vol d, MOD A2C: 83.7 ml LV vol d, MOD A4C: 202.0 ml LV vol s, MOD A2C: 68.0 ml LV vol s, MOD A4C: 187.0 ml LV SV MOD A2C:     15.7 ml LV SV MOD A4C:     202.0 ml LV SV MOD BP:      18.0 ml RIGHT VENTRICLE            IVC RV S prime:     7.67 cm/s  IVC diam: 2.05 cm TAPSE (M-mode): 1.7 cm LEFT ATRIUM             Index       RIGHT ATRIUM           Index LA diam:        3.00 cm 1.54 cm/m  RA Area:  12.90 cm LA Vol (A2C):   30.6 ml 15.67 ml/m RA Volume:   32.10 ml  16.44 ml/m LA Vol (A4C):   48.0 ml 24.58 ml/m LA Biplane Vol: 40.8 ml 20.89 ml/m  AORTIC VALVE LVOT Vmax:   79.90 cm/s LVOT Vmean:  55.800 cm/s LVOT VTI:    0.107 m  AORTA Ao Root diam: 3.00 cm MITRAL VALVE MV Area (PHT): 5.60 cm     SHUNTS MV Decel Time: 136 msec     Systemic VTI:  0.11 m MV E velocity: 125.00 cm/s  Systemic Diam: 1.90 cm Cherlynn Kaiser MD Electronically signed by Cherlynn Kaiser MD Signature Date/Time: 09/13/2020/9:15:56 AM    Final      Assessment & Plan:   History of COVID-19 Syncope Likely vasovagal during syncope.    Referral placed to neurology She was advised orthostatic precautions as well   CVA Referral placed to neurology   Hypertension Continue ARB (Cozaar) and BB (Toprol XL)    Hyperlipidemia -Continue Lipitor 80 mg daily, Zetia   DM type II Hemoglobin A1c of more than 15.   On 70/30 insulin at home.    Dietary modifications emphasized - 6 small meals per day - high protein / low carb    Chronic systolic  CHF Previous 2D echocardiogram with left ventricular ejection fraction of 10 to 15% -Continue Digoxin, Ivabradine, Lasix, Aldactone.  Compliance emphasized.   On Coumadin for LV thrombus   Severe protein calorie malnutrition.   Present on admission.   Patient was advised oral intake    Follow up:  Follow up with PCP as scheduled     Fenton Foy, NP 09/27/2020

## 2020-09-25 NOTE — Telephone Encounter (Signed)
Transition Care Management Follow-up Telephone Call Date of discharge and from where: 09/22/2020, [facility]    Patient had follow up appointment today with Angus Seller, NP.

## 2020-09-26 ENCOUNTER — Ambulatory Visit (INDEPENDENT_AMBULATORY_CARE_PROVIDER_SITE_OTHER): Payer: Medicaid Other | Admitting: *Deleted

## 2020-09-26 ENCOUNTER — Telehealth (HOSPITAL_COMMUNITY): Payer: Self-pay

## 2020-09-26 ENCOUNTER — Other Ambulatory Visit: Payer: Self-pay

## 2020-09-26 DIAGNOSIS — I513 Intracardiac thrombosis, not elsewhere classified: Secondary | ICD-10-CM | POA: Diagnosis not present

## 2020-09-26 DIAGNOSIS — Z7901 Long term (current) use of anticoagulants: Secondary | ICD-10-CM | POA: Diagnosis not present

## 2020-09-26 DIAGNOSIS — I639 Cerebral infarction, unspecified: Secondary | ICD-10-CM

## 2020-09-26 DIAGNOSIS — Z5181 Encounter for therapeutic drug level monitoring: Secondary | ICD-10-CM

## 2020-09-26 LAB — POCT INR: INR: 3.1 — AB (ref 2.0–3.0)

## 2020-09-26 NOTE — Patient Instructions (Addendum)
A full discussion of the nature of anticoagulants has been carried out.  A benefit risk analysis has been presented to the patient, so that they understand the justification for choosing anticoagulation at this time. The need for frequent and regular monitoring, precise dosage adjustment and compliance is stressed.  Side effects of potential bleeding are discussed.  The patient should avoid any OTC items containing aspirin or ibuprofen, and should avoid great swings in general diet.  Avoid alcohol consumption.  Call if any signs of abnormal bleeding.    Description   Today take 1/2 tablet then start taking 1 tablet daily except 1/2 tablet on Saturdays. Recheck INR in 1 week. [facility] [phone]

## 2020-09-26 NOTE — Telephone Encounter (Signed)
Spoke to Jason Stevenson who agreed on 10:00 home visit tomorrow. Call complete.

## 2020-09-27 ENCOUNTER — Encounter (HOSPITAL_COMMUNITY): Payer: Self-pay | Admitting: Adult Health

## 2020-09-27 ENCOUNTER — Other Ambulatory Visit (HOSPITAL_COMMUNITY): Payer: Self-pay

## 2020-09-27 NOTE — Progress Notes (Signed)
Paramedicine Encounter    Patient ID: Jason Stevenson, male    DOB: 1985-09-22, 35 y.o.   MRN: [id number]   Patient Care Team: Ladell Pier, MD as PCP - General (Internal Medicine) Josue Hector, MD as PCP - Cardiology (Cardiology)  Patient Active Problem List   Diagnosis Date Noted   Protein-calorie malnutrition, severe 09/22/2020   Acute CVA (cerebrovascular accident) ([place]) 09/21/2020   Near syncope 09/21/2020   Encounter for monitoring Coumadin therapy 09/20/2020   LV (left ventricular) mural thrombus 09/13/2020   Seizure ([place]) 09/13/2020   Neurological deficit present 09/12/2020   History of COVID-19 09/12/2020   Intracerebral hemorrhage 09/12/2020   Acute cerebrovascular accident (CVA) ([place]) 09/12/2020   Hyperosmolar hyperglycemic state (HHS) ([place]) 09/11/2020   COVID-19 08/23/2020   Noncompliance with medication treatment due to intermittent use of medication 10/12/2018   Dyslipidemia 04/17/2016   Uncontrolled type 2 diabetes mellitus without complication, with long-term current use of insulin 61/68/3729   Chronic systolic CHF (congestive heart failure) ([place]) 06/18/2015   Needs flu shot 12/23/2013   Type 2 diabetes mellitus ([place]) 11/18/2013   Diabetes ([place]) 06/17/2013   Non-ischemic cardiomyopathy ([place]) 11/06/2012   HTN (hypertension) 11/06/2012   HLD (hyperlipidemia) 11/06/2012    Current Outpatient Medications:    acetaminophen (TYLENOL) 500 MG tablet, Take 1,500 mg by mouth every 6 (six) hours as needed for moderate pain or headache., Disp: , Rfl:    aspirin 81 MG EC tablet, Take 1 tablet (81 mg total) by mouth daily. Swallow whole., Disp: 30 tablet, Rfl: 0   atorvastatin (LIPITOR) 80 MG tablet, Take 1 tablet (80 mg total) by mouth daily., Disp: 30 tablet, Rfl: 0   Blood Glucose Monitoring Suppl (TRUE METRIX METER) w/Device KIT, 1 each by Does not apply route 3 (three) times daily., Disp: 1 kit, Rfl: 0   Blood Pressure Monitor DEVI, Use as directed to check  home blood pressure 2-3 times a week, Disp: 1 Device, Rfl: 0   digoxin (LANOXIN) 0.125 MG tablet, Take 1 tablet (0.125 mg total) by mouth daily., Disp: 30 tablet, Rfl: 0   ezetimibe (ZETIA) 10 MG tablet, Take 1 tablet (10 mg total) by mouth daily., Disp: 30 tablet, Rfl: 0   furosemide (LASIX) 20 MG tablet, Take 1 tablet (20 mg total) by mouth daily., Disp: 30 tablet, Rfl: 0   glipiZIDE (GLUCOTROL) 10 MG tablet, Take 1 tablet (10 mg total) by mouth daily before breakfast. To lower blood sugar, Disp: 30 tablet, Rfl: 0   glucose blood test strip, Use as instructed (Patient taking differently: Use as instructed 2-3 times per day), Disp: 100 each, Rfl: 12   insulin isophane & regular human (NOVOLIN 70/30 FLEXPEN) (70-30) 100 UNIT/ML KwikPen, Inject 22 Units into the skin in the morning and at bedtime., Disp: 13.2 mL, Rfl: 0   Insulin Pen Needle (PEN NEEDLES) 31G X 8 MM MISC, UAD, Disp: 100 each, Rfl: 6   Insulin Pen Needle 32G X 4 MM MISC, Use in the morning, at noon, and at bedtime., Disp: 100 each, Rfl: 0   Insulin Syringe-Needle U-100 (RELION INSULIN SYRINGE) 31G X 15/64" 0.3 ML MISC, Use to inject insulin daily., Disp: 100 each, Rfl: 11   ivabradine (CORLANOR) 7.5 MG TABS tablet, Take 1 tablet (7.5 mg total) by mouth 2 (two) times daily with a meal., Disp: 60 tablet, Rfl: 0   levETIRAcetam (KEPPRA) 750 MG tablet, Take 2 tablets (1,500 mg total) by mouth 2 (two) times daily., Disp: 120 tablet,  Rfl: 0   losartan (COZAAR) 25 MG tablet, Take 1/2 tablet (12.5 mg total) by mouth daily., Disp: 15 tablet, Rfl: 0   metFORMIN (GLUCOPHAGE) 500 MG tablet, Take 1 tablet (500 mg total) by mouth 2 (two) times daily with a meal. TAKE 1 TABLET BY MOUTH 2 TIMES DAILY WITH A MEAL (Patient taking differently: Take 500 mg by mouth 2 (two) times daily with a meal.), Disp: 60 tablet, Rfl: 6   metoprolol (TOPROL-XL) 200 MG 24 hr tablet, Take 1 tablet (200 mg total) by mouth daily. Take with or immediately following a meal.,  Disp: 30 tablet, Rfl: 0   nitroGLYCERIN (NITROSTAT) 0.4 MG SL tablet, Place 1 tablet (0.4 mg total) under the tongue every 5 (five) minutes as needed for chest pain., Disp: 25 tablet, Rfl: 2   Oxcarbazepine (TRILEPTAL) 300 MG tablet, Take 2 tablets (600 mg total) by mouth 2 (two) times daily., Disp: 120 tablet, Rfl: 0   phenytoin (DILANTIN) 100 MG ER capsule, Take 1 capsule (100 mg total) by mouth 3 (three) times daily., Disp: 90 capsule, Rfl: 0   spironolactone (ALDACTONE) 25 MG tablet, Take 1/2 tablet (12.5 mg total) by mouth daily., Disp: 15 tablet, Rfl: 0   warfarin (COUMADIN) 5 MG tablet, Take 1 tablet (5 mg total) by mouth daily., Disp: 30 tablet, Rfl: 0 No Known Allergies   Social History   Socioeconomic History   Marital status: Married    Spouse name: Not on file   Number of children: Not on file   Years of education: Not on file   Highest education level: Not on file  Occupational History   Occupation: unemployed  Tobacco Use   Smoking status: Never   Smokeless tobacco: Never  Vaping Use   Vaping Use: Not on file  Substance and Sexual Activity   Alcohol use: Not Currently    Comment: "occasional" when "hanging out with the wrong people" No recent use.   Drug use: Not Currently    Types: Marijuana    Comment: occasional, last 2013   Sexual activity: Not Currently  Other Topics Concern   Not on file  Social History Narrative   Pt lives alone. Has no information on father.   Social Determinants of Health   Financial Resource Strain: High Risk   Difficulty of Paying Living Expenses: Hard  Food Insecurity: No Food Insecurity   Worried About Charity fundraiser in the Last Year: Never true   Ran Out of Food in the Last Year: Never true  Transportation Needs: Unmet Transportation Needs   Lack of Transportation (Medical): Yes   Lack of Transportation (Non-Medical): Yes  Physical Activity: Not on file  Stress: Not on file  Social Connections: Not on file  Intimate  Partner Violence: Not on file    Physical Exam Vitals reviewed.  Constitutional:      Appearance: Normal appearance. He is normal weight.  HENT:     Head: Normocephalic.     Nose: Nose normal.     Mouth/Throat:     Mouth: Mucous membranes are moist.     Pharynx: Oropharynx is clear.  Eyes:     Conjunctiva/sclera: Conjunctivae normal.     Pupils: Pupils are equal, round, and reactive to light.  Cardiovascular:     Rate and Rhythm: Normal rate and regular rhythm.     Pulses: Normal pulses.     Heart sounds: Normal heart sounds.  Pulmonary:     Effort: Pulmonary effort is normal.  Breath sounds: Normal breath sounds.  Abdominal:     General: Abdomen is flat.     Palpations: Abdomen is soft.  Musculoskeletal:        General: No swelling. Normal range of motion.     Cervical back: Normal range of motion.     Right lower leg: No edema.     Left lower leg: No edema.  Skin:    General: Skin is warm and dry.     Capillary Refill: Capillary refill takes less than 2 seconds.  Neurological:     General: No focal deficit present.     Mental Status: He is alert. Mental status is at baseline.  Psychiatric:        Mood and Affect: Mood normal.    Arrived for initial visit for [facility]. Elison reports he is feeling okay today. Traver's wife filled pill box prior to me coming out and I verified same for accuracy and filled up the days already taken for the week. Joie has been compliant with his medications according to pill count. Felis reports he wants to do better with his medications and diet to ensure he stays heathy out of the hospital. I reviewed appointments with [name] and set him up with [facility] transportation for his upcoming appointments. Zaccai was given all details of appointments and transportation. Rodrigo as educated on [employer] and was grateful for the assistance. I will plan to do weekly visits on Wednesdays. Pio agreed to plan. Assessment as noted, no  swelling, edema, JVD or shortness of breath. Lung sounds clear. CBG- 221 (No insulin yet today)   Orthostatic vitals assessed and are as noted in report. No dizziness or lightheadedness upon standing.   Home visit complete. I will see Joseguadalupe in one week.    Xzaiver gets his medications through [employer] and wellness, discussed moving to [employer] will continue to discuss.   Refills: NONE   Social: -Needs blessed table referral   Future Appointments  Date Time Provider [place]  10/03/2020  8:45 AM Cori Razor [facility]  10/03/2020  9:30 AM Carey Bullocks, OT [facility]  10/03/2020 10:15 AM Frazier Butt, PT [facility]  10/03/2020 11:30 AM [facility]  10/09/2020  2:00 PM [facility] PA/NP [facility] None  10/27/2020  9:30 AM Ladell Pier, MD [facility] None     ACTION: Home visit completed Next visit planned for one week

## 2020-09-27 NOTE — Progress Notes (Signed)
Team Based Care Meeting  HF MD- NA  HF NP - Jakayden Cancio NP-C   [facility] HF Paramedicine  Zack [facility] Vicente Males   Medications concerns? Pill box set up weekly. Meds through community health and wellness.   Transportation issues ? Yes,.using [facility].  Education needs? HF/DM diet. Heart Failure binder discussed  SDOH  - Transportation as above.   Continue HF Paramedicine.   Ivan Maskell NP-C   4:27 PM

## 2020-09-27 NOTE — Assessment & Plan Note (Signed)
Syncope Likely vasovagal during syncope.    Referral placed to neurology She was advised orthostatic precautions as well   CVA Referral placed to neurology   Hypertension Continue ARB (Cozaar) and BB (Toprol XL)    Hyperlipidemia -Continue Lipitor 80 mg daily, Zetia   DM type II Hemoglobin A1c of more than 15.   On 70/30 insulin at home.    Dietary modifications emphasized - 6 small meals per day - high protein / low carb    Chronic systolic CHF Previous 2D echocardiogram with left ventricular ejection fraction of 10 to 15% -Continue Digoxin, Ivabradine, Lasix, Aldactone.  Compliance emphasized.   On Coumadin for LV thrombus   Severe protein calorie malnutrition.   Present on admission.   Patient was advised oral intake    Follow up:  Follow up with PCP as scheduled

## 2020-10-02 ENCOUNTER — Encounter: Payer: Self-pay | Admitting: Internal Medicine

## 2020-10-02 ENCOUNTER — Other Ambulatory Visit: Payer: Self-pay | Admitting: Internal Medicine

## 2020-10-02 DIAGNOSIS — I5022 Chronic systolic (congestive) heart failure: Secondary | ICD-10-CM

## 2020-10-02 DIAGNOSIS — I513 Intracardiac thrombosis, not elsewhere classified: Secondary | ICD-10-CM

## 2020-10-02 NOTE — Progress Notes (Addendum)
Thursday 09/28/2020 - CSW received a call from Mr. Dittus about his electric bill and needing help.  10/02/20 Monday - CSW called the patient back and asked that he follow up with the 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 Mr. Zavadil reported that the shut off notice is dated for 10/04/20 and CSW encouraged Mr. Nine to call the [facility] outpatient clinic at [phone] and ask to speak with the social worker about this. Patient reported understanding and thanked the CSW and ended the call.  CSW sent an in-basket message to the outpatient social worker at the [facility] clinic to keep her in the loop and meet with the patient at their follow up appointment scheduled for 10/09/20.  Leanda Padmore, MSW, LCSWA [phone] Heart Failure Social Worker

## 2020-10-03 ENCOUNTER — Other Ambulatory Visit: Payer: Self-pay

## 2020-10-03 ENCOUNTER — Ambulatory Visit: Payer: Medicaid Other | Attending: Internal Medicine

## 2020-10-03 ENCOUNTER — Ambulatory Visit: Payer: Medicaid Other | Admitting: Physical Therapy

## 2020-10-03 ENCOUNTER — Ambulatory Visit: Payer: Medicaid Other | Admitting: Occupational Therapy

## 2020-10-03 ENCOUNTER — Ambulatory Visit (INDEPENDENT_AMBULATORY_CARE_PROVIDER_SITE_OTHER): Payer: Medicaid Other

## 2020-10-03 VITALS — BP 101/75 | HR 100

## 2020-10-03 DIAGNOSIS — R41842 Visuospatial deficit: Secondary | ICD-10-CM

## 2020-10-03 DIAGNOSIS — R278 Other lack of coordination: Secondary | ICD-10-CM | POA: Insufficient documentation

## 2020-10-03 DIAGNOSIS — R2689 Other abnormalities of gait and mobility: Secondary | ICD-10-CM | POA: Diagnosis present

## 2020-10-03 DIAGNOSIS — Z7901 Long term (current) use of anticoagulants: Secondary | ICD-10-CM | POA: Diagnosis not present

## 2020-10-03 DIAGNOSIS — R41844 Frontal lobe and executive function deficit: Secondary | ICD-10-CM

## 2020-10-03 DIAGNOSIS — I513 Intracardiac thrombosis, not elsewhere classified: Secondary | ICD-10-CM

## 2020-10-03 DIAGNOSIS — I639 Cerebral infarction, unspecified: Secondary | ICD-10-CM | POA: Diagnosis not present

## 2020-10-03 DIAGNOSIS — M6281 Muscle weakness (generalized): Secondary | ICD-10-CM | POA: Diagnosis present

## 2020-10-03 DIAGNOSIS — R41841 Cognitive communication deficit: Secondary | ICD-10-CM | POA: Diagnosis not present

## 2020-10-03 DIAGNOSIS — R2681 Unsteadiness on feet: Secondary | ICD-10-CM | POA: Insufficient documentation

## 2020-10-03 DIAGNOSIS — Z5181 Encounter for therapeutic drug level monitoring: Secondary | ICD-10-CM

## 2020-10-03 LAB — POCT INR: INR: 2 (ref 2.0–3.0)

## 2020-10-03 NOTE — Therapy (Signed)
[facility] [address] [place], [place], [address] Phone: [phone]   Fax:  [phone]  Speech Language Pathology Evaluation  Patient Details  Name: Jason Stevenson MRN: [id number] Date of Birth: 11/05/1985 Referring Provider (SLP): (referring) [facility]; Marcine Matar- PCP   Encounter Date: 10/03/2020   End of Session - 10/03/20 1044     Visit Number 1    Number of Visits 9    Date for SLP Re-Evaluation 12/17/20    SLP Start Time 0850    SLP Stop Time  0933    SLP Time Calculation (min) 43 min    Activity Tolerance Patient tolerated treatment well             Past Medical History:  Diagnosis Date   Chronic systolic CHF (congestive heart failure) (HCC) 06/18/2015   Diabetes mellitus without complication (HCC)    Hemorrhagic stroke (HCC)    Hyperlipidemia    Hypertension    Nonischemic cardiomyopathy (HCC) Noted as early as 2007   Per chart review (cards consult note 2011), EF of 40% in 2007, down to 20-25% in 2011    Past Surgical History:  Procedure Laterality Date   None      There were no vitals filed for this visit.   Subjective Assessment - 10/03/20 0853     Subjective "Only thing I tihnk is different is sometimes I get a little jittery, and maybe my right side is a little weaker?"                SLP Evaluation [facility] - 10/03/20 0859       SLP Visit Information   SLP Received On 10/03/20    Referring Provider (SLP) (referring) [facility]; Marcine Matar- PCP    Onset Date 09-11-20    Medical Diagnosis CVA/seizure      Subjective   Subjective "My thinking is a little slower, I think."      Pain Assessment   Currently in Pain? No/denies      General Information   HPI 35 y/o male admitted secondary to increased L sided possible seizure. MRI+ subacute hemorrhagic infarcts involving the anterior right frontal lobe and left parieto-occipital cortex,  with additional subacute right parietal infarct. Pt also found to be in hyperosmolar hyperglycemic state. PMH includes HTN, DM, CHF, nonischemic cardiomyopathy. Pt had SLE while inpatient with no acute ST recommended.      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     Cognition   Overall Cognitive Status Impaired/Different from baseline    Area of Impairment --   see below   Problem Solving Slow processing    Problem Solving Comments On CLQT, pt took 2 minutes, 45 seconds to complete the clock drawing task (with poor spacing of 12, 3, and 6, and 3 numbers rotated) wth pt scoring a "mild" deficit, and he completed only 4 designs in Stage manager Impaired    Organizing Impairment --   in novel tasks     Auditory Comprehension   Overall Auditory Comprehension Appears within functional limits for tasks assessed      Verbal Expression   Overall  Verbal Expression Appears within functional limits for tasks assessed      Motor Speech   Overall Motor Speech Appears within functional limits for tasks assessed                             SLP Education - 10/03/20 1042     Education Details Wait to schedule due to insurance plan, [place] financial assistance forms, deficit areas as organization and processing, like pt to be independent with meds and appointments, ST "discharge packet"    Person(s) Educated Patient    Methods Explanation;Handout    Comprehension Verbalized understanding              SLP Short Term Goals - 10/03/20 1239       SLP SHORT TERM GOAL #1   Title pt will demo proper use of organizational compenstions in 3 sessions    Time 4    Period Weeks    Status New    Target  Date 11/09/20      SLP SHORT TERM GOAL #2   Title pt will fill his medication box with compensations between 2 sessions    Time 4    Period Weeks    Target Date 11/09/20      SLP SHORT TERM GOAL #3   Title pt will use compensations to organize a novel task    Time 4    Period Weeks    Status New    Target Date 11/09/20              SLP Long Term Goals - 10/03/20 1245       SLP LONG TERM GOAL #1   Title pt will demonstrate [facility] executive function in order to plan details of a 2-week vacation using compensations    Time 8    Period Weeks    Status New    Target Date 12/15/20      SLP LONG TERM GOAL #2   Title pt will successfully use compensations to demonstrate functional processing skills in novel tasks in 3 sessions    Time 8    Period Weeks    Status New    Target Date 12/15/20              Plan - 10/03/20 1231     Clinical Impression Statement Jason Stevenson presents today with mild cognitive deficits in the areas of processing speed and organization/executive function. Pt's organization of a novel task involving specific parameters (design generation subtest of Cognitive Linguistic Quick Test) was disorganized and lacked linear thought - pt only generated 4 designs. On the clock drawing subtest, pt performance indicated a mild deficit in executive function. Jason Stevenson endorses his wife and visiting RN are filling his medication box, and his wife is tracking appointments for him. He tells SLP prior to the CVA he would have done this himself. Pt would benefit from skilled ST targeting these areas of deficit in order to fulfill pt's desire to enter the workforce again.    Speech Therapy Frequency 1x /week    Duration 8 weeks   due to insurance limitations   Treatment/Interventions Compensatory techniques;Functional tasks;SLP instruction and feedback;Cueing hierarchy;Cognitive reorganization;Patient/family education;Internal/external aids    Potential to Achieve Goals Good     Consulted and Agree with Plan of Care Patient             Patient will benefit from skilled therapeutic intervention in order to improve  the following deficits and impairments:   Cognitive communication deficit    Problem List Patient Active Problem List   Diagnosis Date Noted   Protein-calorie malnutrition, severe 09/22/2020   Acute CVA (cerebrovascular accident) (HCC) 09/21/2020   Near syncope 09/21/2020   Encounter for monitoring Coumadin therapy 09/20/2020   LV (left ventricular) mural thrombus 09/13/2020   Seizure (HCC) 09/13/2020   Neurological deficit present 09/12/2020   History of COVID-19 09/12/2020   Intracerebral hemorrhage 09/12/2020   Acute cerebrovascular accident (CVA) (HCC) 09/12/2020   Hyperosmolar hyperglycemic state (HHS) (HCC) 09/11/2020   COVID-19 08/23/2020   Noncompliance with medication treatment due to intermittent use of medication 10/12/2018   Dyslipidemia 04/17/2016   Uncontrolled type 2 diabetes mellitus without complication, with long-term current use of insulin 08/07/2015   Chronic systolic CHF (congestive heart failure) (HCC) 06/18/2015   Needs flu shot 12/23/2013   Type 2 diabetes mellitus (HCC) 11/18/2013   Diabetes (HCC) 06/17/2013   Non-ischemic cardiomyopathy (HCC) 11/06/2012   HTN (hypertension) 11/06/2012   HLD (hyperlipidemia) 11/06/2012    Jason Stevenson ,MS, CCC-SLP  10/03/2020, 12:46 PM  [facility] [address] [place], [place], [address] Phone: [phone]   Fax:  [phone]  Name: Jason Stevenson MRN: [id number] Date of Birth: 31-Oct-1985

## 2020-10-03 NOTE — Patient Instructions (Signed)
Description   Continue on same dosage 1 tablet daily except 1/2 tablet on Saturdays. Recheck INR in 1 week. Coumadin Clinic [phone]

## 2020-10-03 NOTE — Therapy (Signed)
[facility] [address] [place], [place], [address] Phone: [phone]   Fax:  [phone]  Physical Therapy Evaluation  Patient Details  Name: Jason Stevenson MRN: [id number] Date of Birth: 12/10/1985 Referring Provider (PT): [name], MD (PCP)   Encounter Date: 10/03/2020   PT End of Session - 10/03/20 0852     Visit Number 1    Number of Visits 8    Date for PT Re-Evaluation 12/01/20    Authorization Type Medicaid Family Planning-Rehab visits not covered; pt is applying to CAFA    PT Start Time 1018    PT Stop Time 1058    PT Time Calculation (min) 40 min    Activity Tolerance Patient tolerated treatment well    Behavior During Therapy [facility] for tasks assessed/performed             Past Medical History:  Diagnosis Date   Chronic systolic CHF (congestive heart failure) (HCC) 06/18/2015   Diabetes mellitus without complication (HCC)    Hemorrhagic stroke (HCC)    Hyperlipidemia    Hypertension    Nonischemic cardiomyopathy (HCC) Noted as early as 2007   Per chart review (cards consult note 2011), EF of 40% in 2007, down to 20-25% in 2011    Past Surgical History:  Procedure Laterality Date   None      Vitals:   10/03/20 1055  BP: 101/75  Pulse: 100      Subjective Assessment - 10/03/20 0852     Subjective Pt reports having a "young" stroke.  Caught Covid the end of April, and then didn't feel great.  When it was over, I started have several "episodes" where I had seizures and CVA. Notice that with walking , I'm definitely slower.  Feel like L side was affected.  Prior to CVA, pt was independent.  Had one fall after discharging from hospital.    Pertinent History PMH:  HTN, HLD, Type 2 DM, CHF, seizure (seizure was after Covid 07/2020)    Patient Stated Goals Pt's goal for therapy is to get back to normal activities, get back to driving.    Currently in Pain? No/denies                 [facility] PT Assessment - 10/03/20 1027       Assessment   Medical Diagnosis CVA, near syncope    Referring Provider (PT) [name], MD (PCP)    Onset Date/Surgical Date 09/11/20    Hand Dominance Right      Precautions   Precautions Fall    Precaution Comments Avoid straining; no lifting weights      Balance Screen   Has the patient fallen in the past 6 months Yes    How many times? 1    Has the patient had a decrease in activity level because of a fear of falling?  Yes      Home Environment   Living Environment Private residence    Living Arrangements Spouse/significant other   wife works full time   Available Help at Discharge Family    Type of Home House    Home Access Stairs to enter    Entrance Stairs-Number of Steps 3    Entrance Stairs-Rails None    Home Layout One level    Home Equipment None      Prior Function   Level of Independence Independent    Vocation Unemployed    Leisure Primary school teacher; watching TV/movies; drumming,  enjoys church activities      Observation/Other Assessments   Focus on Therapeutic Outcomes (FOTO)  Staff did not capture; FOTO not set up      Sensation   Light Touch Appears Intact    Proprioception Appears Intact      ROM / Strength   AROM / PROM / Strength AROM;Strength      AROM   Overall AROM  Within functional limits for tasks performed      Strength   Overall Strength Unable to assess;Due to precautions    Overall Strength Comments Grossly tested at least 3/5      Transfers   Transfers Sit to Stand;Stand to Sit    Sit to Stand 6: Modified independent (Device/Increase time);With upper extremity assist;From chair/3-in-1    Stand to Sit 6: Modified independent (Device/Increase time);With upper extremity assist;To chair/3-in-1      Ambulation/Gait   Ambulation/Gait Yes    Ambulation/Gait Assistance 6: Modified independent (Device/Increase time)    Ambulation Distance (Feet) 200 Feet    Assistive device None     Gait Pattern Step-through pattern    Ambulation Surface Level;Indoor    Gait velocity 15.09 sec = 2.17 ft/sec      Functional Gait  Assessment   Gait assessed  Yes    Gait Level Surface Walks 20 ft, slow speed, abnormal gait pattern, evidence for imbalance or deviates 10-15 in outside of the 12 in walkway width. Requires more than 7 sec to ambulate 20 ft.   9.31   Change in Gait Speed Able to change speed, demonstrates mild gait deviations, deviates 6-10 in outside of the 12 in walkway width, or no gait deviations, unable to achieve a major change in velocity, or uses a change in velocity, or uses an assistive device.    Gait with Horizontal Head Turns Performs head turns with moderate changes in gait velocity, slows down, deviates 10-15 in outside 12 in walkway width but recovers, can continue to walk.   11.3   Gait with Vertical Head Turns Performs task with moderate change in gait velocity, slows down, deviates 10-15 in outside 12 in walkway width but recovers, can continue to walk.   10.09   Gait and Pivot Turn Pivot turns safely in greater than 3 sec and stops with no loss of balance, or pivot turns safely within 3 sec and stops with mild imbalance, requires small steps to catch balance.   3.28   Step Over Obstacle Is able to step over one shoe box (4.5 in total height) but must slow down and adjust steps to clear box safely. May require verbal cueing.    Gait with Narrow Base of Support Is able to ambulate for 10 steps heel to toe with no staggering.    Gait with Eyes Closed Walks 20 ft, slow speed, abnormal gait pattern, evidence for imbalance, deviates 10-15 in outside 12 in walkway width. Requires more than 9 sec to ambulate 20 ft.   16.71   Ambulating Backwards Walks 20 ft, slow speed, abnormal gait pattern, evidence for imbalance, deviates 10-15 in outside 12 in walkway width.   26 sec   Steps Alternating feet, no rail.    Total Score 16    FGA comment: Scores < 22/30 indicate increased  fall risk                        Objective measurements completed on examination: See above findings.  PT Education - 10/03/20 1051     Education Details PT eval results, POC    Person(s) Educated Patient    Methods Explanation    Comprehension Verbalized understanding              PT Short Term Goals - 10/03/20 1222       PT SHORT TERM GOAL #1   Title Pt will be independent with HEP for improved strength, balance, and gait.  TARGET 11/03/2020    Baseline no current HEP    Time 4    Period Weeks    Status New      PT SHORT TERM GOAL #2   Title Pt will improve FGA score to at least 20/30 to decrease fall risk.    Baseline 16/30    Time 4    Period Weeks    Status New      PT SHORT TERM GOAL #3   Title Pt will verbalize understanding of fall prevention in home environment.    Baseline at fall risk per FGA score 16/30    Time 4    Period Weeks    Status New               PT Long Term Goals - 10/03/20 1223       PT LONG TERM GOAL #1   Title Pt will be independent with HEP for improved strength, balance, transfers, and gait.  TARGET 12/01/2020    Baseline no current HEP    Time 8    Period Weeks    Status New      PT LONG TERM GOAL #2   Title Pt will improve FGA score to at least 24/30 to decrease fall risk    Baseline 16/30 at eval    Time 8    Period Weeks    Status New      PT LONG TERM GOAL #3   Title Pt will improve gait velocity to at least 2.62 ft/sec for improved gait efficiency and safety.    Baseline 2.17 ft/sec    Time 8    Period Weeks    Status New      PT LONG TERM GOAL #4   Title Pt will negotiate at least 4 steps, no handrail, mod I for improved safety and confidence with stair negotiation into home.    Baseline 4 steps, prefers rail    Time 8    Period Weeks    Status New      PT LONG TERM GOAL #5   Title Pt will ambulate at least 1000 ft, indoor and outdoor surfaces, mod I, no LOB for  improved community mobility.    Baseline gait with supervision    Time 8    Period Weeks    Status New                    Plan - 10/03/20 9470     Clinical Impression Statement Pt is a 35 year old male who presents to OPPT with dx of near syncope, acute CVA (cerebellar infarct without bleed).  He was hospitalized 5/30-09/19/20 and discharged home.  He had fall and presented to ED.  He presents today with decreased balance, decreased gait velocity (limited community ambulator); he is at fall risk per FGA score.  Prior to hospitalizations, he was independent.  He will benefit from skilled PT to address the above stated deficits for improved overall functional mobility and independence, as well as  decreased fall risk.    Personal Factors and Comorbidities Comorbidity 3+    Comorbidities PMH:  HTN, HLD, Type 2 DM, CHF, seizure ( after Covid 07/2020)    Examination-Activity Limitations Stand;Stairs;Locomotion Level;Transfers    Examination-Participation Restrictions Community Activity;Driving;Volunteer;Church    Stability/Clinical Decision Making Evolving/Moderate complexity    Clinical Decision Making Moderate    Rehab Potential Good    PT Frequency 1x / week    PT Duration 8 weeks   plus eval; pt concerned regarding finances; therefore, pt not scheduling until CAFA application finalized   PT Treatment/Interventions ADLs/Self Care Home Management;Gait training;Stair training;Functional mobility training;Therapeutic activities;Therapeutic exercise;Balance training;Neuromuscular re-education;Patient/family education    PT Next Visit Plan Initiate HEP for high level balance and walking program; due to insurance limitations/finances, pt opts not to schedule at eval; he was given [employer] aid application and will contact clinic for scheduling once he hears from that.    Consulted and Agree with Plan of Care Patient             Patient will benefit from skilled therapeutic  intervention in order to improve the following deficits and impairments:  Abnormal gait, Difficulty walking, Decreased balance, Decreased mobility, Decreased strength  Visit Diagnosis: Other abnormalities of gait and mobility  Unsteadiness on feet     Problem List Patient Active Problem List   Diagnosis Date Noted   Protein-calorie malnutrition, severe 09/22/2020   Acute CVA (cerebrovascular accident) (HCC) 09/21/2020   Near syncope 09/21/2020   Encounter for monitoring Coumadin therapy 09/20/2020   LV (left ventricular) mural thrombus 09/13/2020   Seizure (HCC) 09/13/2020   Neurological deficit present 09/12/2020   History of COVID-19 09/12/2020   Intracerebral hemorrhage 09/12/2020   Acute cerebrovascular accident (CVA) (HCC) 09/12/2020   Hyperosmolar hyperglycemic state (HHS) (HCC) 09/11/2020   COVID-19 08/23/2020   Noncompliance with medication treatment due to intermittent use of medication 10/12/2018   Dyslipidemia 04/17/2016   Uncontrolled type 2 diabetes mellitus without complication, with long-term current use of insulin 08/07/2015   Chronic systolic CHF (congestive heart failure) (HCC) 06/18/2015   Needs flu shot 12/23/2013   Type 2 diabetes mellitus (HCC) 11/18/2013   Diabetes (HCC) 06/17/2013   Non-ischemic cardiomyopathy (HCC) 11/06/2012   HTN (hypertension) 11/06/2012   HLD (hyperlipidemia) 11/06/2012    Merland Holness W. 10/03/2020, 12:27 PM Gean Maidens., PT   [facility] [address] [place], [place], [address] Phone: [phone]   Fax:  [phone]  Name: Jason Stevenson MRN: [id number] Date of Birth: 1985/11/06

## 2020-10-03 NOTE — Patient Instructions (Addendum)
   I saw you had a little difficulty with organization of a task that was not familiar to you, and I would like you to be independent with organizing your meds and your appointments instead of relying on the nurse and your wife. We agreed you would have done these things prior to being in the hospital. I think we could do some things to assist you.  We do not want you to be burdened by the financial responsibility of therapies, since your Medicaid does not cover therapy services. Fill out the [facility] Assistance form and return it ASAP. You told me today you would not be able to shoulder this financial responsibility for therapy services, and I completely understand. If the [facility] financial assistance is unable to be initiated, this packet has some good pointers to use - some of them you may find helpful, if financial assistance is not able to assist you at this time.

## 2020-10-04 ENCOUNTER — Other Ambulatory Visit (HOSPITAL_COMMUNITY): Payer: Self-pay

## 2020-10-04 NOTE — Therapy (Signed)
[facility] [address] [place], [place], [address] Phone: [phone]   Fax:  [phone]  Occupational Therapy Evaluation  Patient Details  Name: Jason Stevenson MRN: [id number] Date of Birth: 1986-03-02 Referring Provider (OT): Joycelyn Das, MD   Encounter Date: 10/03/2020   OT End of Session - 10/03/20 0939     Visit Number 1    Number of Visits 9    Date for OT Re-Evaluation 01/01/21    Authorization Type Medicaid [name] - specialty services required (OP rehab not covered); pt applying for [facility]    OT Start Time [phone]   delayed from prior session   OT Stop Time 1015    OT Time Calculation (min) 37 min    Activity Tolerance Patient tolerated treatment well    Behavior During Therapy [facility] for tasks assessed/performed             Past Medical History:  Diagnosis Date   Chronic systolic CHF (congestive heart failure) (HCC) 06/18/2015   Diabetes mellitus without complication (HCC)    Hemorrhagic stroke (HCC)    Hyperlipidemia    Hypertension    Nonischemic cardiomyopathy (HCC) Noted as early as 2007   Per chart review (cards consult note 2011), EF of 40% in 2007, down to 20-25% in 2011    Past Surgical History:  Procedure Laterality Date   None      There were no vitals filed for this visit.   Subjective Assessment - 10/03/20 0939     Subjective  Pt arrives to OT evaluation session today reporting that, after recoving from recent covid infection, he began experiencing brief episodes when he felt like his "brain was shutting down." States he presented to ED after experiencing stroke-like symptoms and feels he still has some weakness in L side and that his speech sometimes feels "a little slow." He reports that he has been compliant w/ his medications since d/c from hospital and believes he has continued to make improvements functionally.    Pertinent History Significant chronic systolic CHF, T2DM, HTN, HLD,  COVID+ in April    Limitations Per pt report, "no straining" until medically cleared    Patient Stated Goals "To get better"    Currently in Pain? No/denies               [facility] OT Assessment - 10/03/20 0942       Assessment   Medical Diagnosis CVA; near syncope    Referring Provider (OT) Joycelyn Das, MD    Onset Date/Surgical Date 09/11/20    Hand Dominance Right      Precautions   Precautions Fall    Precaution Comments Avoid straining; no lifting weights      Balance Screen   Has the patient fallen in the past 6 months Yes   Related to dx     Home  Environment   Type of Home House    Home Access Stairs    Entrance Stairs-Rails None    Entrance Stairs-Number of Steps 2    Home Layout One level    Bathroom Shower/Tub Tub/Shower unit;Curtain    Home Equipment None    Lives With [employer]     Prior Function   Level of Independence Independent    Vocation Unemployed    Leisure Primary school teacher; watching TV/movies      ADL   Eating/Feeding Independent    Grooming Independent    Upper Body Bathing Modified independent  Lower Body Bathing Modified independent    Upper Body Dressing Independent    Lower Body Dressing Modified independent   Due to occasional dizziness/lightheadedness; requires extended time   Toilet Transfer Modified independent    Toileting - Clothing Manipulation Modified independent    Tub/Shower Transfer Modified independent   OT discussed potential use of shower seat/tub bench and/or grab bars for safety   ADL comments Per pt report, Mod I w/ most BADLs due to requiring extended time for safety, but is typically able to complete w/out difficulty      IADL   Meal Prep Able to complete simple warm meal prep;Able to complete simple cold meal and snack prep   Reports not participating in meal prep much since d/c from hospital; wife completes most meal prep/housekeeping tasks   Prior Level of Function Community Mobility Driving independently     Community Mobility Relies on family or friends for transportation    Medication Management Takes responsibility if medication is prepared in advance in seperate dosage   Reports his wife completes med mgmt for him at this time     Mobility   Mobility Status Comments Ambulated into session w/out assistive device   Reports ambulation often feels "slow"     Written Expression   Handwriting Increased time   Pt reports his handwriting feels "off"     Vision - History   Baseline Vision No visual deficits    Additional Comments Pt reports occasional blurry vision      Vision Assessment   Ocular Range of Motion Within Functional Limits    Tracking/Visual Pursuits Able to track stimulus in all quads without difficulty    Saccades Within functional limits    Convergence Within functional limits    Visual Fields No apparent deficits      Cognition   Overall Cognitive Status Cognition to be further assessed in functional context PRN      Sensation   Light Touch Appears Intact    Hot/Cold Appears Intact   per pt report     Coordination   Gross Motor Movements are Fluid and Coordinated Yes    Fine Motor Movements are Fluid and Coordinated Yes    Finger Nose Finger Test Mild undershooting and decreased smoothness of movement observed    9 Hole Peg Test Right;Left    Right 9 Hole Peg Test 38 sec    Left 9 Hole Peg Test 39 sec      ROM / Strength   AROM / PROM / Strength AROM;Strength      AROM   Overall AROM  Within functional limits for tasks performed   bilateral shoulder, elbow, wrist, hand     Strength   Overall Strength Unable to assess;Due to precautions    Overall Strength Comments BUE at least 3/5 via gross assessment               OT Education - 10/03/20 0939     Education Details Education provided on role and purpose of OT, as well as potential interventions and goals for therapy. OT also provided education on shower seat/tub bench for safety due to pt's report of  near syncope and occasional dizziness, as well as receiving medical clearance from physician prior to returning to driving.    Person(s) Educated Patient    Methods Explanation    Comprehension Verbalized understanding              OT Short Term Goals - 10/03/20 1014  OT SHORT TERM GOAL #1   Title Pt will independently verbalize at least 2 energy conservation strategies to improve safety during BADLs    Baseline Decreased endurance and knowledge of energy conservation strategies    Time 4    Period Weeks    Status New    Target Date 10/31/20      OT SHORT TERM GOAL #2   Title Pt will complete Trail Making Test: Part A in under 40 sec to facilitate potential return to driving    Baseline Observable decreased speed of movement; TMT: Part A age-related norm is 28.5 sec    Time 4    Period Weeks    Status New               OT Long Term Goals - 10/03/20 1014       OT LONG TERM GOAL #1   Title Pt will be independent w/ full HEP designed for coordination, visual perception, and strength by discharge    Baseline No HEP at this time    Time 8    Period Weeks    Status New    Target Date 11/28/20      OT LONG TERM GOAL #2   Title Pt will independently complete environmental scanning activity in busy environment w/ at least 90% accuracy and report no dizziness/lightheadedness in preparation for potential return to driving    Baseline Reports frequent dizziness/lightheadedness/visual disturbances w/ complex environmental scanning    Time 8    Period Weeks    Status New      OT LONG TERM GOAL #3   Title Pt will demonstrate improved processing speed and functional FM skills as evidenced by decreasing 9-HPT time by at least 10 sec w/ each UE    Baseline 9-HPT RUE (38 sec); LUE (39 sec)    Time 8    Period Weeks    Status New      OT LONG TERM GOAL #4   Title Pt will complete Trail Making Test: Part B in under 1 minute to facilitate potential return to driving     Baseline Observable decreased speed of movement; TMT: Part B age-related norm is 58.5 sec    Time 8    Period Weeks    Status New              Plan - 10/03/20 0934     Clinical Impression Statement Pt is a 35 y.o. male who presents to OP OT due to CVA sustained 09/11/20. Per chart review, pt arrived to the ED w/ L-sided deficits and dysarthria and was then discharged to home 09/19/20; pt returned to hospital 09/21/2020 2/2 episode of near syncope in which he fell and hit his head. Also notable from hospital d/c summary: "intermittent seizure activity confirmed on continuous EEG." Pt currently lives with his wife Lysle Dingwall) in a single-level home and was not working prior to onset due to medical complications. PMHx includes chronic systolic CHF, DMT2, HTN, HLD, and hx of COVID infection (07/2020). Pt will benefit from skilled occupational therapy services to address strength and coordination, GM/FM control, higher level cognition and executive functioning, compensatory strategies including AE/assistive devices prn, visual-perception, and implementation of an HEP to improve participation and safety during ADLs and IADLs.    OT Occupational Profile and History Detailed Assessment- Review of Records and additional review of physical, cognitive, psychosocial history related to current functional performance    Occupational performance deficits (Please refer to evaluation for details):  ADL's;IADL's;Work;Leisure    Body Structure / Function / Physical Skills ADL;Decreased knowledge of precautions;UE functional use;Balance;Decreased knowledge of use of DME;FMC;Mobility;Body mechanics;Dexterity;Gait;Vision;Vestibular;Cardiopulmonary status limiting activity;Strength;Endurance;Coordination;IADL    Rehab Potential Good    Clinical Decision Making Several treatment options, min-mod task modification necessary    Comorbidities Affecting Occupational Performance: Presence of comorbidities impacting occupational  performance    Modification or Assistance to Complete Evaluation  Min-Moderate modification of tasks or assist with assess necessary to complete eval    OT Frequency 1x / week    OT Duration 8 weeks   due to financial concerns, pt not scheduling additional visits until receiving response to [facility] application   OT Treatment/Interventions Self-care/ADL training;Therapeutic exercise;Visual/perceptual remediation/compensation;Patient/family education;Neuromuscular education;Moist Heat;Fluidtherapy;Energy conservation;Building services engineer;Therapeutic activities;Cognitive remediation/compensation;Manual Therapy;DME and/or AE instruction;Cryotherapy    Plan Initiate FMC/coordination activities; complete Trail Making Test: Part A/B and further assess visual perception in functional context prn    Consulted and Agree with Plan of Care Patient             Patient will benefit from skilled therapeutic intervention in order to improve the following deficits and impairments:   Body Structure / Function / Physical Skills: ADL, Decreased knowledge of precautions, UE functional use, Balance, Decreased knowledge of use of DME, FMC, Mobility, Body mechanics, Dexterity, Gait, Vision, Vestibular, Cardiopulmonary status limiting activity, Strength, Endurance, Coordination, IADL       Visit Diagnosis: Other lack of coordination  Frontal lobe and executive function deficit  Muscle weakness (generalized)  Visuospatial deficit    Problem List Patient Active Problem List   Diagnosis Date Noted   Protein-calorie malnutrition, severe 09/22/2020   Acute CVA (cerebrovascular accident) (HCC) 09/21/2020   Near syncope 09/21/2020   Encounter for monitoring Coumadin therapy 09/20/2020   LV (left ventricular) mural thrombus 09/13/2020   Seizure (HCC) 09/13/2020   Neurological deficit present 09/12/2020   History of COVID-19 09/12/2020   Intracerebral hemorrhage 09/12/2020   Acute cerebrovascular  accident (CVA) (HCC) 09/12/2020   Hyperosmolar hyperglycemic state (HHS) (HCC) 09/11/2020   COVID-19 08/23/2020   Noncompliance with medication treatment due to intermittent use of medication 10/12/2018   Dyslipidemia 04/17/2016   Uncontrolled type 2 diabetes mellitus without complication, with long-term current use of insulin 08/07/2015   Chronic systolic CHF (congestive heart failure) (HCC) 06/18/2015   Needs flu shot 12/23/2013   Type 2 diabetes mellitus (HCC) 11/18/2013   Diabetes (HCC) 06/17/2013   Non-ischemic cardiomyopathy (HCC) 11/06/2012   HTN (hypertension) 11/06/2012   HLD (hyperlipidemia) 11/06/2012     Rosie Fate, OTR/L, MSOT  10/04/2020, 5:05 PM  [facility] [address] [place], [place], [address] Phone: [phone]   Fax:  [phone]  Name: Jason Stevenson MRN: [id number] Date of Birth: 20-May-1985

## 2020-10-04 NOTE — Progress Notes (Signed)
Paramedicine Encounter    Patient ID: Jason Stevenson, male    DOB: 1985-09-22, 35 y.o.   MRN: [id number]   Patient Care Team: Ladell Pier, MD as PCP - General (Internal Medicine) Josue Hector, MD as PCP - Cardiology (Cardiology)  Patient Active Problem List   Diagnosis Date Noted   Protein-calorie malnutrition, severe 09/22/2020   Acute CVA (cerebrovascular accident) ([place]) 09/21/2020   Near syncope 09/21/2020   Encounter for monitoring Coumadin therapy 09/20/2020   LV (left ventricular) mural thrombus 09/13/2020   Seizure ([place]) 09/13/2020   Neurological deficit present 09/12/2020   History of COVID-19 09/12/2020   Intracerebral hemorrhage 09/12/2020   Acute cerebrovascular accident (CVA) ([place]) 09/12/2020   Hyperosmolar hyperglycemic state (HHS) ([place]) 09/11/2020   COVID-19 08/23/2020   Noncompliance with medication treatment due to intermittent use of medication 10/12/2018   Dyslipidemia 04/17/2016   Uncontrolled type 2 diabetes mellitus without complication, with long-term current use of insulin 61/68/3729   Chronic systolic CHF (congestive heart failure) ([place]) 06/18/2015   Needs flu shot 12/23/2013   Type 2 diabetes mellitus ([place]) 11/18/2013   Diabetes ([place]) 06/17/2013   Non-ischemic cardiomyopathy ([place]) 11/06/2012   HTN (hypertension) 11/06/2012   HLD (hyperlipidemia) 11/06/2012    Current Outpatient Medications:    acetaminophen (TYLENOL) 500 MG tablet, Take 1,500 mg by mouth every 6 (six) hours as needed for moderate pain or headache., Disp: , Rfl:    aspirin 81 MG EC tablet, Take 1 tablet (81 mg total) by mouth daily. Swallow whole., Disp: 30 tablet, Rfl: 0   atorvastatin (LIPITOR) 80 MG tablet, Take 1 tablet (80 mg total) by mouth daily., Disp: 30 tablet, Rfl: 0   Blood Glucose Monitoring Suppl (TRUE METRIX METER) w/Device KIT, 1 each by Does not apply route 3 (three) times daily., Disp: 1 kit, Rfl: 0   Blood Pressure Monitor DEVI, Use as directed to check  home blood pressure 2-3 times a week, Disp: 1 Device, Rfl: 0   digoxin (LANOXIN) 0.125 MG tablet, Take 1 tablet (0.125 mg total) by mouth daily., Disp: 30 tablet, Rfl: 0   ezetimibe (ZETIA) 10 MG tablet, Take 1 tablet (10 mg total) by mouth daily., Disp: 30 tablet, Rfl: 0   furosemide (LASIX) 20 MG tablet, Take 1 tablet (20 mg total) by mouth daily., Disp: 30 tablet, Rfl: 0   glipiZIDE (GLUCOTROL) 10 MG tablet, Take 1 tablet (10 mg total) by mouth daily before breakfast. To lower blood sugar, Disp: 30 tablet, Rfl: 0   glucose blood test strip, Use as instructed (Patient taking differently: Use as instructed 2-3 times per day), Disp: 100 each, Rfl: 12   insulin isophane & regular human (NOVOLIN 70/30 FLEXPEN) (70-30) 100 UNIT/ML KwikPen, Inject 22 Units into the skin in the morning and at bedtime., Disp: 13.2 mL, Rfl: 0   Insulin Pen Needle (PEN NEEDLES) 31G X 8 MM MISC, UAD, Disp: 100 each, Rfl: 6   Insulin Pen Needle 32G X 4 MM MISC, Use in the morning, at noon, and at bedtime., Disp: 100 each, Rfl: 0   Insulin Syringe-Needle U-100 (RELION INSULIN SYRINGE) 31G X 15/64" 0.3 ML MISC, Use to inject insulin daily., Disp: 100 each, Rfl: 11   ivabradine (CORLANOR) 7.5 MG TABS tablet, Take 1 tablet (7.5 mg total) by mouth 2 (two) times daily with a meal., Disp: 60 tablet, Rfl: 0   levETIRAcetam (KEPPRA) 750 MG tablet, Take 2 tablets (1,500 mg total) by mouth 2 (two) times daily., Disp: 120 tablet,  Rfl: 0   losartan (COZAAR) 25 MG tablet, Take 1/2 tablet (12.5 mg total) by mouth daily., Disp: 15 tablet, Rfl: 0   metFORMIN (GLUCOPHAGE) 500 MG tablet, Take 1 tablet (500 mg total) by mouth 2 (two) times daily with a meal. TAKE 1 TABLET BY MOUTH 2 TIMES DAILY WITH A MEAL (Patient taking differently: Take 500 mg by mouth 2 (two) times daily with a meal.), Disp: 60 tablet, Rfl: 6   metoprolol (TOPROL-XL) 200 MG 24 hr tablet, Take 1 tablet (200 mg total) by mouth daily. Take with or immediately following a meal.,  Disp: 30 tablet, Rfl: 0   nitroGLYCERIN (NITROSTAT) 0.4 MG SL tablet, Place 1 tablet (0.4 mg total) under the tongue every 5 (five) minutes as needed for chest pain., Disp: 25 tablet, Rfl: 2   Oxcarbazepine (TRILEPTAL) 300 MG tablet, Take 2 tablets (600 mg total) by mouth 2 (two) times daily., Disp: 120 tablet, Rfl: 0   phenytoin (DILANTIN) 100 MG ER capsule, Take 1 capsule (100 mg total) by mouth 3 (three) times daily., Disp: 90 capsule, Rfl: 0   spironolactone (ALDACTONE) 25 MG tablet, Take 1/2 tablet (12.5 mg total) by mouth daily., Disp: 15 tablet, Rfl: 0   warfarin (COUMADIN) 5 MG tablet, Take 1 tablet (5 mg total) by mouth daily., Disp: 30 tablet, Rfl: 0 No Known Allergies   Social History   Socioeconomic History   Marital status: Married    Spouse name: Not on file   Number of children: Not on file   Years of education: Not on file   Highest education level: Not on file  Occupational History   Occupation: unemployed  Tobacco Use   Smoking status: Never   Smokeless tobacco: Never  Vaping Use   Vaping Use: Not on file  Substance and Sexual Activity   Alcohol use: Not Currently    Comment: "occasional" when "hanging out with the wrong people" No recent use.   Drug use: Not Currently    Types: Marijuana    Comment: occasional, last 2013   Sexual activity: Not Currently  Other Topics Concern   Not on file  Social History Narrative   Pt lives alone. Has no information on father.   Social Determinants of Health   Financial Resource Strain: High Risk   Difficulty of Paying Living Expenses: Hard  Food Insecurity: No Food Insecurity   Worried About Charity fundraiser in the Last Year: Never true   Ran Out of Food in the Last Year: Never true  Transportation Needs: Unmet Transportation Needs   Lack of Transportation (Medical): Yes   Lack of Transportation (Non-Medical): Yes  Physical Activity: Not on file  Stress: Not on file  Social Connections: Not on file  Intimate  Partner Violence: Not on file    Physical Exam Vitals reviewed.  Constitutional:      Appearance: Normal appearance. He is normal weight.  HENT:     Head: Normocephalic.     Nose: Nose normal.     Mouth/Throat:     Mouth: Mucous membranes are moist.     Pharynx: Oropharynx is clear.  Eyes:     Conjunctiva/sclera: Conjunctivae normal.     Pupils: Pupils are equal, round, and reactive to light.  Cardiovascular:     Rate and Rhythm: Normal rate and regular rhythm.     Pulses: Normal pulses.     Heart sounds: Normal heart sounds.  Pulmonary:     Effort: Pulmonary effort is normal.  Breath sounds: Normal breath sounds.  Abdominal:     General: Abdomen is flat.     Palpations: Abdomen is soft.  Musculoskeletal:        General: No swelling. Normal range of motion.     Cervical back: Normal range of motion.     Right lower leg: No edema.     Left lower leg: No edema.  Skin:    General: Skin is warm and dry.     Capillary Refill: Capillary refill takes less than 2 seconds.  Neurological:     General: No focal deficit present.     Mental Status: He is alert. Mental status is at baseline.  Psychiatric:        Mood and Affect: Mood normal.    Arrived for home visit for Damarien who was alert and oriented reporting to be feeling okay today. He says yesterday he was seen at outpatient rehab and they advised him to schedule further visits once he has established insurance. [place] Medicaid Family Planning not accepted. I am assisting the patient in applying for medicaid, disability, cone financial assistance at this time. I reviewed upcoming appointments with [name] and set up transportation for him.  6/27 ride pick up 1:20   6/28 ride pick up 10:35  Vitals and assessment are as noted. Johngabriel denied chest pain, shortness of breath, dizziness or trouble sleeping or eating.I reviewed medications and filled pill box accordingly. Jahvon will need a Pensions consultant as his insurance does not  cover same and it is very expensive. I will assist with this.   I will turn in [place] cone transport waiver tomorrow in clinic.    Home visit complete. I will see Travonne in clinic on Monday.   Refills: NONE   CBG- [id number]       Future Appointments  Date Time Provider Strong  10/09/2020  2:00 PM [facility] PA/NP [facility] None  10/10/2020 11:30 AM [facility] PHARMACIST [facility]  10/10/2020 12:00 PM [facility] COUMADIN CLINIC [facility]  10/27/2020  9:30 AM Ladell Pier, MD [facility] None  12/20/2020  9:00 AM Penumalli, Earlean Polka, MD [facility] None     ACTION: Home visit completed

## 2020-10-09 ENCOUNTER — Other Ambulatory Visit (HOSPITAL_COMMUNITY): Payer: Self-pay

## 2020-10-09 ENCOUNTER — Ambulatory Visit (HOSPITAL_COMMUNITY)
Admit: 2020-10-09 | Discharge: 2020-10-09 | Disposition: A | Discharge status: Home / Self Care | Payer: Medicaid Other | Attending: Family Medicine | Admitting: Family Medicine

## 2020-10-09 ENCOUNTER — Encounter (HOSPITAL_COMMUNITY): Payer: Self-pay

## 2020-10-09 ENCOUNTER — Other Ambulatory Visit: Payer: Self-pay

## 2020-10-09 VITALS — BP 114/80 | HR 103 | Wt 157.0 lb

## 2020-10-09 DIAGNOSIS — Z8249 Family history of ischemic heart disease and other diseases of the circulatory system: Secondary | ICD-10-CM | POA: Insufficient documentation

## 2020-10-09 DIAGNOSIS — I513 Intracardiac thrombosis, not elsewhere classified: Secondary | ICD-10-CM | POA: Diagnosis not present

## 2020-10-09 DIAGNOSIS — E1169 Type 2 diabetes mellitus with other specified complication: Secondary | ICD-10-CM

## 2020-10-09 DIAGNOSIS — Z794 Long term (current) use of insulin: Secondary | ICD-10-CM | POA: Diagnosis not present

## 2020-10-09 DIAGNOSIS — Z8616 Personal history of COVID-19: Secondary | ICD-10-CM | POA: Insufficient documentation

## 2020-10-09 DIAGNOSIS — E1165 Type 2 diabetes mellitus with hyperglycemia: Secondary | ICD-10-CM | POA: Diagnosis not present

## 2020-10-09 DIAGNOSIS — Z7982 Long term (current) use of aspirin: Secondary | ICD-10-CM | POA: Diagnosis not present

## 2020-10-09 DIAGNOSIS — R569 Unspecified convulsions: Secondary | ICD-10-CM

## 2020-10-09 DIAGNOSIS — E785 Hyperlipidemia, unspecified: Secondary | ICD-10-CM

## 2020-10-09 DIAGNOSIS — I69322 Dysarthria following cerebral infarction: Secondary | ICD-10-CM | POA: Diagnosis not present

## 2020-10-09 DIAGNOSIS — Z79899 Other long term (current) drug therapy: Secondary | ICD-10-CM | POA: Diagnosis not present

## 2020-10-09 DIAGNOSIS — Z7984 Long term (current) use of oral hypoglycemic drugs: Secondary | ICD-10-CM | POA: Diagnosis not present

## 2020-10-09 DIAGNOSIS — Z7901 Long term (current) use of anticoagulants: Secondary | ICD-10-CM | POA: Insufficient documentation

## 2020-10-09 DIAGNOSIS — I428 Other cardiomyopathies: Secondary | ICD-10-CM | POA: Diagnosis not present

## 2020-10-09 DIAGNOSIS — I639 Cerebral infarction, unspecified: Secondary | ICD-10-CM

## 2020-10-09 DIAGNOSIS — I110 Hypertensive heart disease with heart failure: Secondary | ICD-10-CM | POA: Insufficient documentation

## 2020-10-09 DIAGNOSIS — I5022 Chronic systolic (congestive) heart failure: Secondary | ICD-10-CM

## 2020-10-09 LAB — CBC
HCT: 40.8 % (ref 39.0–52.0)
Hemoglobin: 13.2 g/dL (ref 13.0–17.0)
MCH: 29.1 pg (ref 26.0–34.0)
MCHC: 32.4 g/dL (ref 30.0–36.0)
MCV: 89.9 fL (ref 80.0–100.0)
Platelets: 252 10*3/uL (ref 150–400)
RBC: 4.54 MIL/uL (ref 4.22–5.81)
RDW: 12.9 % (ref 11.5–15.5)
WBC: 3.9 10*3/uL — ABNORMAL LOW (ref 4.0–10.5)
nRBC: 0 % (ref 0.0–0.2)

## 2020-10-09 LAB — BASIC METABOLIC PANEL
Anion gap: 6 (ref 5–15)
BUN: 13 mg/dL (ref 6–20)
CO2: 30 mmol/L (ref 22–32)
Calcium: 8.9 mg/dL (ref 8.9–10.3)
Chloride: 103 mmol/L (ref 98–111)
Creatinine, Ser: 0.89 mg/dL (ref 0.61–1.24)
GFR, Estimated: >60 mL/min (ref >60)
Glucose, Bld: 221 mg/dL — ABNORMAL HIGH (ref 70–99)
Potassium: 4.3 mmol/L (ref 3.5–5.1)
Sodium: 139 mmol/L (ref 135–145)

## 2020-10-09 LAB — DIGOXIN LEVEL: Digoxin Level: 0.5 ng/mL — ABNORMAL LOW (ref 0.8–2.0)

## 2020-10-09 MED ORDER — METOPROLOL SUCCINATE ER 200 MG PO TB24
200.0000 mg | ORAL_TABLET | Freq: Every day | ORAL | 2 refills | Status: DC
  Filled 2020-10-09 – 2020-10-12 (×2): qty 30, 30d supply, fill #0
  Filled 2020-11-09: qty 30, 30d supply, fill #1
  Filled 2020-12-19: qty 30, 30d supply, fill #2

## 2020-10-09 MED ORDER — WARFARIN SODIUM 5 MG PO TABS
5.0000 mg | ORAL_TABLET | Freq: Every day | ORAL | 2 refills | Status: DC
  Filled 2020-10-09 – 2020-10-12 (×2): qty 30, 30d supply, fill #0

## 2020-10-09 MED ORDER — ATORVASTATIN CALCIUM 80 MG PO TABS
80.0000 mg | ORAL_TABLET | Freq: Every day | ORAL | 2 refills | Status: DC
  Filled 2020-10-09 – 2020-10-12 (×2): qty 30, 30d supply, fill #0
  Filled 2020-11-09: qty 30, 30d supply, fill #1
  Filled 2020-12-19: qty 30, 30d supply, fill #2

## 2020-10-09 MED ORDER — ASPIRIN 81 MG PO TBEC
81.0000 mg | DELAYED_RELEASE_TABLET | Freq: Every day | ORAL | 2 refills | Status: DC
  Filled 2020-10-09 – 2020-10-12 (×2): qty 30, 30d supply, fill #0

## 2020-10-09 MED ORDER — SPIRONOLACTONE 25 MG PO TABS
12.5000 mg | ORAL_TABLET | Freq: Every day | ORAL | 2 refills | Status: DC
  Filled 2020-10-09 – 2020-10-12 (×2): qty 15, 30d supply, fill #0

## 2020-10-09 MED ORDER — FUROSEMIDE 20 MG PO TABS
20.0000 mg | ORAL_TABLET | Freq: Every day | ORAL | 2 refills | Status: DC
  Filled 2020-10-09 – 2020-10-12 (×2): qty 30, 30d supply, fill #0
  Filled 2020-11-09: qty 30, 30d supply, fill #1

## 2020-10-09 MED ORDER — DIGOXIN 125 MCG PO TABS
0.1250 mg | ORAL_TABLET | Freq: Every day | ORAL | 2 refills | Status: DC
  Filled 2020-10-09 – 2020-10-12 (×2): qty 30, 30d supply, fill #0
  Filled 2020-11-09: qty 30, 30d supply, fill #1
  Filled 2020-12-19: qty 30, 30d supply, fill #2

## 2020-10-09 MED ORDER — EZETIMIBE 10 MG PO TABS
10.0000 mg | ORAL_TABLET | Freq: Every day | ORAL | 2 refills | Status: DC
  Filled 2020-10-09 – 2020-10-12 (×2): qty 30, 30d supply, fill #0
  Filled 2020-11-09: qty 30, 30d supply, fill #1
  Filled 2020-12-11 (×2): qty 30, 30d supply, fill #2

## 2020-10-09 MED ORDER — LOSARTAN POTASSIUM 25 MG PO TABS
12.5000 mg | ORAL_TABLET | Freq: Every day | ORAL | 2 refills | Status: DC
  Filled 2020-10-09 – 2020-10-12 (×2): qty 15, 30d supply, fill #0

## 2020-10-09 NOTE — Progress Notes (Signed)
PCP: Dr. Karle Plumber Primary Cardiologist: Dr. Aundra Dubin  HPI: 35 yo male with diabetes, HTN, and a long history of nonischemic cardiomyopathy. He had a cardiac MRI in 1/08 showing low EF, but he says that he had been told about "heart problems" even prior to that.  He does not have a family history of cardiomyopathy that he knows of, but does not know his father's family.  Coronary CT angiogram in 2014 showed no CAD. Echo in 2/17 showed EF 20-25%, similar to 2014.  He has generally had sporadic followup over the last few years, but has been seeing Dr Johnsie Cancel recently. He was seen in the [facility] in 2017 but no f/u since. Last f/u w/ Cardiology was in 2020. Had repeat echo in 2020 showing LVEF 15-20%. RV normal. Most recently, he was diagnosed w/ COVID in April.  He presented to [facility] ED 09/11/20 with tingling in left arm, left face, drooling and blank stare for about 1 week, and admitted for possible seizure, HHS and possible CVA.  In ED, he had left-sided neurologic deficit with dysarthria.  CT head with probable subacute infarct in right frontal lobe and old right parietal infarct.  Reportedly, patient's neurologic deficits resolved in ED, and focus shifted to possible seizure versus stroke.  He was loaded with Keppra.   MRI brain with probable hemorrhagic infarct involving the anterior right frontal lobe and left parietal occipital cortex with additional chronic right parietal infarct, right frontotemporal region enhancement suggesting changes related to acute seizure, possible toxic insult involving the caudate and lentiform nuclei bilaterally.  Neurology recommended interval follow-up brain MRI.  CTA head and neck without significant finding.   TTE with EF of 10-15%, LV thrombus, moderate RV dysfunction and dilated IVC. He was discharged on GDMT, but A1c too high for SGLT2i.    Seen in [facility] ED 09/21/20 for fall after syncopal episode on toilet, hitting his head. CT of the head was significant for no bleeding  but new low-density left cerebellum suggesting acute infarct. EEG unremarkable and felt fall secondary to vagal episode not seizure.  Today he returns for post hospital HF follow up. Overall feeling fine but is not exerting himself physically. Denies increasing SOB, CP, dizziness, edema, or PND/Orthopnea. Appetite ok. No fever or chills. Weight at home 150-152 pounds. Taking all medications. Counting carbs and checking blood sugars TID.  ROS: All systems negative except as listed in HPI, PMH and Problem List.  SH:  Social History   Socioeconomic History   Marital status: Married    Spouse name: Not on file   Number of children: Not on file   Years of education: Not on file   Highest education level: Not on file  Occupational History   Occupation: unemployed  Tobacco Use   Smoking status: Never   Smokeless tobacco: Never  Vaping Use   Vaping Use: Not on file  Substance and Sexual Activity   Alcohol use: Not Currently    Comment: "occasional" when "hanging out with the wrong people" No recent use.   Drug use: Not Currently    Types: Marijuana    Comment: occasional, last 2013   Sexual activity: Not Currently  Other Topics Concern   Not on file  Social History Narrative   Pt lives alone. Has no information on father.   Social Determinants of Health   Financial Resource Strain: High Risk   Difficulty of Paying Living Expenses: Hard  Food Insecurity: No Food Insecurity   Worried About Running Out  of Food in the Last Year: Never true   Ran Out of Food in the Last Year: Never true  Transportation Needs: Unmet Transportation Needs   Lack of Transportation (Medical): Yes   Lack of Transportation (Non-Medical): Yes  Physical Activity: Not on file  Stress: Not on file  Social Connections: Not on file  Intimate Partner Violence: Not on file   FH:  Family History  Problem Relation Age of Onset   Stroke Mother    Diabetes Mother    Hypertension Mother    Stroke Maternal Aunt     Heart attack Neg Hx    Past Medical History:  Diagnosis Date   Chronic systolic CHF (congestive heart failure) ([place]) 06/18/2015   Diabetes mellitus without complication ([place])    Hemorrhagic stroke ([place])    Hyperlipidemia    Hypertension    Nonischemic cardiomyopathy ([place]) Noted as early as 2007   Per chart review (cards consult note 2011), EF of 40% in 2007, down to 20-25% in 2011   Current Outpatient Medications  Medication Sig Dispense Refill   acetaminophen (TYLENOL) 500 MG tablet Take 1,500 mg by mouth every 6 (six) hours as needed for moderate pain or headache.     aspirin 81 MG EC tablet Take 1 tablet (81 mg total) by mouth daily. Swallow whole. 30 tablet 0   atorvastatin (LIPITOR) 80 MG tablet Take 1 tablet (80 mg total) by mouth daily. 30 tablet 0   Blood Glucose Monitoring Suppl (TRUE METRIX METER) w/Device KIT 1 each by Does not apply route 3 (three) times daily. 1 kit 0   Blood Pressure Monitor DEVI Use as directed to check home blood pressure 2-3 times a week 1 Device 0   digoxin (LANOXIN) 0.125 MG tablet Take 1 tablet (0.125 mg total) by mouth daily. 30 tablet 0   ezetimibe (ZETIA) 10 MG tablet Take 1 tablet (10 mg total) by mouth daily. 30 tablet 0   furosemide (LASIX) 20 MG tablet Take 1 tablet (20 mg total) by mouth daily. 30 tablet 0   glipiZIDE (GLUCOTROL) 10 MG tablet Take 1 tablet (10 mg total) by mouth daily before breakfast. To lower blood sugar 30 tablet 0   glucose blood test strip Use as instructed 100 each 12   insulin isophane & regular human (NOVOLIN 70/30 FLEXPEN) (70-30) 100 UNIT/ML KwikPen Inject 22 Units into the skin in the morning and at bedtime. 13.2 mL 0   Insulin Pen Needle (PEN NEEDLES) 31G X 8 MM MISC UAD 100 each 6   Insulin Pen Needle 32G X 4 MM MISC Use in the morning, at noon, and at bedtime. 100 each 0   Insulin Syringe-Needle U-100 (RELION INSULIN SYRINGE) 31G X 15/64" 0.3 ML MISC Use to inject insulin daily. 100 each 11   ivabradine  (CORLANOR) 7.5 MG TABS tablet Take 1 tablet (7.5 mg total) by mouth 2 (two) times daily with a meal. 60 tablet 0   levETIRAcetam (KEPPRA) 750 MG tablet Take 2 tablets (1,500 mg total) by mouth 2 (two) times daily. 120 tablet 0   losartan (COZAAR) 25 MG tablet Take 1/2 tablet (12.5 mg total) by mouth daily. 15 tablet 0   metFORMIN (GLUCOPHAGE) 500 MG tablet Take 1 tablet (500 mg total) by mouth 2 (two) times daily with a meal. TAKE 1 TABLET BY MOUTH 2 TIMES DAILY WITH A MEAL (Patient taking differently: Take 500 mg by mouth 2 (two) times daily with a meal.) 60 tablet 6   metoprolol (  TOPROL-XL) 200 MG 24 hr tablet Take 1 tablet (200 mg total) by mouth daily. Take with or immediately following a meal. 30 tablet 0   nitroGLYCERIN (NITROSTAT) 0.4 MG SL tablet Place 1 tablet (0.4 mg total) under the tongue every 5 (five) minutes as needed for chest pain. 25 tablet 2   Oxcarbazepine (TRILEPTAL) 300 MG tablet Take 2 tablets (600 mg total) by mouth 2 (two) times daily. 120 tablet 0   phenytoin (DILANTIN) 100 MG ER capsule Take 1 capsule (100 mg total) by mouth 3 (three) times daily. 90 capsule 0   spironolactone (ALDACTONE) 25 MG tablet Take 1/2 tablet (12.5 mg total) by mouth daily. 15 tablet 0   warfarin (COUMADIN) 5 MG tablet Take 1 tablet (5 mg total) by mouth daily. 30 tablet 0   No current facility-administered medications for this encounter.   BP 114/80   Pulse (!) 103   Wt 71.2 kg (157 lb)   SpO2 99%   BMI 21.90 kg/m   Wt Readings from Last 3 Encounters:  10/09/20 71.2 kg (157 lb)  10/04/20 69 kg (152 lb 1.6 oz)  09/27/20 68 kg (150 lb)   PHYSICAL EXAM: General:  NAD. No resp difficulty, thin. HEENT: Normal Neck: Supple. No JVD. Carotids 2+ bilat; no bruits. No lymphadenopathy or thryomegaly appreciated. Cor: PMI nondisplaced. Regular rate & rhythm. No rubs, gallops or murmurs. Lungs: Clear Abdomen: Soft, nontender, nondistended. No hepatosplenomegaly. No bruits or masses. Good bowel  sounds. Extremities: No cyanosis, clubbing, rash, edema Neuro: Alert & oriented x 3, cranial nerves grossly intact. Moves all 4 extremities w/o difficulty. Affect pleasant.  ECG: SR 99 bpm (personally reviewed).  ASSESSMENT & PLAN:  1. Chronic systolic CHF: Nonischemic cardiomyopathy known for years.  Normal coronary CTA in 2014.  Echo this admission with EF 10-15%, LV thrombus, moderate RV dysfunction, dilated IVC. With markedly high LDL and poorly controlled glucose, concern for development of CAD though no chest pain.  On exam, he does not look volume overloaded though IVC dilated on echo.   - NYHA I, volume good today. - Continue Toprol XL 200 mg daily. - Continue Ivabradine 7.5 mg bid. HR 99. - Continue digoxin 0.125. Check digoxin level today.  - Continue spironolactone 12.5 daily. - Continue Lasix 20 mg daily. BMET today. - Continue Losartan 12.5 daily. Consider switching to Entresto at next visit. - Hgb A1c too high for SGLT2i (>15.5). - Consider R/LHC in future given risk factors though coronary CTA was negative in the past.  (Would hold off for now given need for anticoagulation and recent CVA). - Continue with Paramedicine. 2. LV thrombus: With associated CVA. - On Coumadin. INRs followed by Coumadin Clinic.   3. 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  Still with some dysarthria. - Anticoagulation ok per neurology. - Atorvastatin. 4. 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  Now on Keppra. 5.  Hyperlipidemia: Markedly high LDL.   - Continue atorvastatin 80 mg daily + Zetia. - F/u LP 6-8 weeks. Lipid Clinic referral if LDL not at goal after improved compliance. 6.  Diabetes: Poor control. Hgb A1c >15.5 - On Novolog 70/30, glipizide, and metformin - Not candidate for SGLT2i   - Encouraged to cut out sweets 2 out of 7 days (currently eating highly processed, hi sugar foods every day).   Greater than 50% of the (total minutes 25) visit spent in  counseling/coordination of care regarding ( details of discussion medication regimen, further cardiac testing, and dietary  and lifestyle modification).   Follow up in 3 weeks with PharmD, 6-8 weeks with APP.  HFSW assisting with Medicaid application, disability paperwork.   Allena Katz, FNP-BC 10/09/20

## 2020-10-09 NOTE — Progress Notes (Signed)
CSW met with patient and paramedic in the clinic. Heather (paramedic)reports patient has submitted all the needed documents for disability application through [facility]. Patient states he has an overdue utility bill with [employer] that totals $1,400. Patient states he has no income at the moment and his wife is working and the only income in the home. Patient was provided a list of resources from the paramedic to assist with utility assistance and is currently following through on calls for financial assistance. CSW can assist with a partial payment through the Patient [place] but will need additional financial support to prevent the services from being shut off. Patient will follow up and return call to [place]. CSW continues to follow and assist as needed. Raquel Sarna, [place], [place]

## 2020-10-09 NOTE — Progress Notes (Unsigned)
Medication Samples have been provided to the patient.  Drug name: Corlanor       Strength: 7.5 mg        Qty: 2  LOT: [id number]  Exp.Date: 07/25  Dosing instructions: Take 1 and a 1/2 tablets Twice daily   The patient has been instructed regarding the correct time, dose, and frequency of taking this medication, including desired effects and most common side effects.   Smitty Cords Klaus Casteneda 2:12 PM 10/09/2020

## 2020-10-09 NOTE — Progress Notes (Signed)
Paramedicine Encounter    Patient ID: Jason Stevenson, male    DOB: 08-Jul-1985, 35 y.o.   MRN: [id number]   Met with Jason Stevenson in clinic today to meet with NP [facility]. Jason Stevenson reviewed chart and medications. Today no medication changes were made. Labs were drawn and dietary education was provided. I plan to see Jason Stevenson in one week at home. Jason Stevenson was given upcoming appointments. Medications for HF were called into [facility] outpatient for HF fund until insurance coverage is provided. Clinic visit complete.   Refills: Tripleptal Spironolactone Warfarin Metoprolol Glipizide Phentoyin Keppra Digoxin Lasix Losartan Atorvastatin Aspirin Zetia     ACTION: Home visit completed

## 2020-10-09 NOTE — Patient Instructions (Signed)
It was great to see you today! No medication changes are needed at this time.   Labs today We will only contact you if something comes back abnormal or we need to make some changes. Otherwise no news is good news!  Your physician recommends that you schedule a follow-up appointment in: 3 weeks with the pharmacy team and in 6-8 weeks  in the Advanced Practitioners (PA/NP) Clinic   Do the following things EVERYDAY: Weigh yourself in the morning before breakfast. Write it down and keep it in a log. Take your medicines as prescribed Eat low salt foods--Limit salt (sodium) to 2000 mg per day.  Stay as active as you can everyday Limit all fluids for the day to less than 2 liters  At the [facility] Clinic, you and your health needs are our priority. As part of our continuing mission to provide you with exceptional heart care, we have created designated Provider Care Teams. These Care Teams include your primary Cardiologist (physician) and Advanced Practice Providers (APPs- Physician Assistants and Nurse Practitioners) who all work together to provide you with the care you need, when you need it.   You may see any of the following providers on your designated Care Team at your next follow up: Dr Arvilla Meres Dr Marca Ancona Dr Brandon Melnick, NP Robbie Lis, [place] Mikki Santee Karle Plumber, PharmD   Please be sure to bring in all your medications bottles to every appointment.   If you have any questions or concerns before your next appointment please send [place] a message through [place] or call our office at [phone].    TO LEAVE A MESSAGE FOR THE NURSE SELECT OPTION 2, PLEASE LEAVE A MESSAGE INCLUDING: YOUR NAME DATE OF BIRTH CALL BACK NUMBER REASON FOR CALL**this is important as we prioritize the call backs  YOU WILL RECEIVE A CALL BACK THE SAME DAY AS LONG AS YOU CALL BEFORE 4:00 PM

## 2020-10-10 ENCOUNTER — Ambulatory Visit (INDEPENDENT_AMBULATORY_CARE_PROVIDER_SITE_OTHER): Payer: Medicaid Other | Admitting: Pharmacist

## 2020-10-10 DIAGNOSIS — Z7901 Long term (current) use of anticoagulants: Secondary | ICD-10-CM | POA: Diagnosis not present

## 2020-10-10 DIAGNOSIS — Z794 Long term (current) use of insulin: Secondary | ICD-10-CM

## 2020-10-10 DIAGNOSIS — I513 Intracardiac thrombosis, not elsewhere classified: Secondary | ICD-10-CM | POA: Diagnosis not present

## 2020-10-10 DIAGNOSIS — E785 Hyperlipidemia, unspecified: Secondary | ICD-10-CM | POA: Diagnosis not present

## 2020-10-10 DIAGNOSIS — I639 Cerebral infarction, unspecified: Secondary | ICD-10-CM

## 2020-10-10 DIAGNOSIS — Z5181 Encounter for therapeutic drug level monitoring: Secondary | ICD-10-CM

## 2020-10-10 DIAGNOSIS — E1159 Type 2 diabetes mellitus with other circulatory complications: Secondary | ICD-10-CM | POA: Diagnosis not present

## 2020-10-10 LAB — POCT INR: INR: 1.8 — AB (ref 2.0–3.0)

## 2020-10-10 NOTE — Patient Instructions (Signed)
Take 1.5 tablets today then start taking 1 tablet daily. Recheck INR in 1 week. Coumadin Clinic [phone]

## 2020-10-10 NOTE — Progress Notes (Signed)
Patient ID: Jason Stevenson                 DOB: [birth year]                    MRN: [id number]     HPI: Jason Stevenson is a 35 y.o. male patient referred to lipid clinic by Dr. Aundra Dubin. PMH is significant for diabetes, HTN, and a long history of nonischemic cardiomyopathy, LV thrombus with associated CVA. Hospitalized recently for CVA. Found to have an A1C of >15.5.   Patient presents today to lipid clinic. He is very motivated to get his health under control. Has been working hard on diet, counting carbs and sodium. Checking his blood sugars, monitoring his BP and weight. BP today 114/77, HR 90, 152 lb He admits in the past he was not compliant with his medications. Sometimes he had insurance sometimes not. He was back and forth between [employer] and [facility] and sometimes he just didn't refill his medications. He states that he wants to live and he is ready to be compliant.  He had a few recently episodes of hypoglycemia at night recently. He snacks throughout the day to make sure his blood sugar does not drop. Feels jittery when blood sugar low. He even feels a litte jittery when BG is in the 200's.  Current Medications: atorvastatin 4m, zetia 173mdaily Risk Factors: CVA LDL goal: <55  Diet: breakfast: 2 slices of raisin brain toast- 2 strawberries and handful of blueberries with a little coconut whipped cream, PB Lunch: chicken wings in air fryer w/ BBQ sauce or light ranch, veGovernment social research officersame as lunch Drinking: water , 2 glucerna per day Snacks: PB jelly sandwich w/ keto bread, granola bar  Exercise: not discussed  Family History:  Family History  Problem Relation Age of Onset   Stroke Mother    Diabetes Mother    Hypertension Mother    Stroke Maternal Aunt    Heart attack Neg Hx      Social History:  Social History   Socioeconomic History   Marital status: Married    Spouse name: Not on file   Number of children: Not on file   Years of  education: Not on file   Highest education level: Not on file  Occupational History   Occupation: unemployed  Tobacco Use   Smoking status: Never   Smokeless tobacco: Never  Vaping Use   Vaping Use: Not on file  Substance and Sexual Activity   Alcohol use: Not Currently    Comment: "occasional" when "hanging out with the wrong people" No recent use.   Drug use: Not Currently    Types: Marijuana    Comment: occasional, last 2013   Sexual activity: Not Currently  Other Topics Concern   Not on file  Social History Narrative   Pt lives alone. Has no information on father.   Social Determinants of Health   Financial Resource Strain: High Risk   Difficulty of Paying Living Expenses: Hard  Food Insecurity: No Food Insecurity   Worried About RuCharity fundraisern the Last Year: Never true   Ran Out of Food in the Last Year: Never true  Transportation Needs: Unmet Transportation Needs   Lack of Transportation (Medical): Yes   Lack of Transportation (Non-Medical): Yes  Physical Activity: Not on file  Stress: Not on file  Social Connections: Not on file  Intimate Partner Violence: Not on file  Labs: 09/12/20 TC 380, TG 149, HDL 45, LDL 305 (no medications)  Past Medical History:  Diagnosis Date   Chronic systolic CHF (congestive heart failure) ([facility]) 06/18/2015   Diabetes mellitus without complication ([facility])    Hemorrhagic stroke ([facility])    Hyperlipidemia    Hypertension    Nonischemic cardiomyopathy ([facility]) Noted as early as 2007   Per chart review (cards consult note 2011), EF of 40% in 2007, down to 20-25% in 2011    Current Outpatient Medications on File Prior to Visit  Medication Sig Dispense Refill   acetaminophen (TYLENOL) 500 MG tablet Take 1,500 mg by mouth every 6 (six) hours as needed for moderate pain or headache.     aspirin 81 MG EC tablet Take 1 tablet (81 mg total) by mouth daily. Swallow whole. 30 tablet 2   atorvastatin (LIPITOR) 80 MG tablet Take 1 tablet  (80 mg total) by mouth daily. 30 tablet 2   Blood Glucose Monitoring Suppl (TRUE METRIX METER) w/Device KIT 1 each by Does not apply route 3 (three) times daily. 1 kit 0   Blood Pressure Monitor DEVI Use as directed to check home blood pressure 2-3 times a week 1 Device 0   digoxin (LANOXIN) 0.125 MG tablet Take 1 tablet (0.125 mg total) by mouth daily. 30 tablet 2   ezetimibe (ZETIA) 10 MG tablet Take 1 tablet (10 mg total) by mouth daily. 30 tablet 2   furosemide (LASIX) 20 MG tablet Take 1 tablet (20 mg total) by mouth daily. 30 tablet 2   glipiZIDE (GLUCOTROL) 10 MG tablet Take 1 tablet (10 mg total) by mouth daily before breakfast. To lower blood sugar 30 tablet 0   glucose blood test strip Use as instructed 100 each 12   insulin isophane & regular human (NOVOLIN 70/30 FLEXPEN) (70-30) 100 UNIT/ML KwikPen Inject 22 Units into the skin in the morning and at bedtime. 13.2 mL 0   Insulin Pen Needle (PEN NEEDLES) 31G X 8 MM MISC UAD 100 each 6   Insulin Pen Needle 32G X 4 MM MISC Use in the morning, at noon, and at bedtime. 100 each 0   Insulin Syringe-Needle U-100 (RELION INSULIN SYRINGE) 31G X 15/64" 0.3 ML MISC Use to inject insulin daily. 100 each 11   ivabradine (CORLANOR) 7.5 MG TABS tablet Take 1 tablet (7.5 mg total) by mouth 2 (two) times daily with a meal. 60 tablet 0   levETIRAcetam (KEPPRA) 750 MG tablet Take 2 tablets (1,500 mg total) by mouth 2 (two) times daily. 120 tablet 0   losartan (COZAAR) 25 MG tablet Take 1/2 tablet (12.5 mg total) by mouth daily. 15 tablet 2   metFORMIN (GLUCOPHAGE) 500 MG tablet Take 1 tablet (500 mg total) by mouth 2 (two) times daily with a meal. TAKE 1 TABLET BY MOUTH 2 TIMES DAILY WITH A MEAL (Patient taking differently: Take 500 mg by mouth 2 (two) times daily with a meal.) 60 tablet 6   metoprolol (TOPROL-XL) 200 MG 24 hr tablet Take 1 tablet (200 mg total) by mouth daily. Take with or immediately following a meal. 30 tablet 2   nitroGLYCERIN  (NITROSTAT) 0.4 MG SL tablet Place 1 tablet (0.4 mg total) under the tongue every 5 (five) minutes as needed for chest pain. 25 tablet 2   Oxcarbazepine (TRILEPTAL) 300 MG tablet Take 2 tablets (600 mg total) by mouth 2 (two) times daily. 120 tablet 0   phenytoin (DILANTIN) 100 MG ER capsule Take 1 capsule (  100 mg total) by mouth 3 (three) times daily. 90 capsule 0   spironolactone (ALDACTONE) 25 MG tablet Take 1/2 tablet (12.5 mg total) by mouth daily. 15 tablet 2   warfarin (COUMADIN) 5 MG tablet Take 1 tablet (5 mg total) by mouth daily. 30 tablet 2   No current facility-administered medications on file prior to visit.    No Known Allergies  Assessment/Plan:  1. Hyperlipidemia - LDL very elevated off any therapy and well above goal of <55. We will recheck labs once patient has been on therapy for at least a month. Recheck on 7/12 when he is here for coumadin clinic apt. We discussed PCSK9i as a possible addition depending on his lab results. He does not have insurance. I have had patient sign the patient assistance form for [place] incase we need to apply. I will call patient once labs result and we will discuss. Continue atrovastatin 80mg and zetia 10mg daily.  2. DM- Diet was discussed in detail with patient. Advised to watch out for added sugars especially in sauces. Recommended natural PB vs products with added ingredients. Less ingredients in products the best. We wary of substitutes for high carb items- most dry veggie pastas contain little veggies and still increase BG. Fresh zucchini noodles much better option. Avoid granola bars due to high sugar content. Limit skin of meat- ok to cook chicken in air fryer but recommended not eating the skin most of the time. Snack on nuts, veggies, apples. He appears very committed to making improvements. Since patient has experienced several lows while sleeping, will reduce evening dose of novolin 70/30 to 18 units. Continue 22 units in the  AM.   Thank you,   Melissa D Maccia, Pharm.D, BCPS, CPP [facility]  [address], [place], [place] [address]  Phone: [phone]; Fax: [phone]    

## 2020-10-10 NOTE — Patient Instructions (Addendum)
Decrease your Novolin 70/30 to 22 units with breakfast and 18 units with dinner  Lab work on 7/12- I will call you when labs come back  Call me at [phone] with any questions

## 2020-10-11 ENCOUNTER — Other Ambulatory Visit: Payer: Self-pay | Admitting: Internal Medicine

## 2020-10-11 ENCOUNTER — Telehealth (HOSPITAL_COMMUNITY): Payer: Self-pay | Admitting: Pharmacy Technician

## 2020-10-11 ENCOUNTER — Other Ambulatory Visit: Payer: Self-pay

## 2020-10-11 DIAGNOSIS — E1142 Type 2 diabetes mellitus with diabetic polyneuropathy: Secondary | ICD-10-CM

## 2020-10-11 DIAGNOSIS — Z794 Long term (current) use of insulin: Secondary | ICD-10-CM

## 2020-10-11 NOTE — Telephone Encounter (Signed)
Advanced Heart Failure Patient Advocate Encounter  Sent in [place] application for Corlanor assistance via fax.  Will follow up.

## 2020-10-11 NOTE — Telephone Encounter (Signed)
Notes to clinic:  medications filled by a different provider  Review for refills    Requested Prescriptions  Pending Prescriptions Disp Refills   Oxcarbazepine (TRILEPTAL) 300 MG tablet 120 tablet 0    Sig: Take 2 tablets (600 mg total) by mouth 2 (two) times daily.      Not Delegated - Neurology: Anticonvulsants - oxcarbazepine Failed - 10/11/2020  9:02 AM      Failed - This refill cannot be delegated      Failed - WBC in normal range and within 360 days    WBC  Date Value Ref Range Status  10/09/2020 3.9 (L) 4.0 - 10.5 K/uL Final          Passed - Na in normal range and within 360 days    Sodium  Date Value Ref Range Status  10/09/2020 139 135 - 145 mmol/L Final  08/04/2019 139 134 - 144 mmol/L Final          Passed - PLT in normal range and within 360 days    Platelets  Date Value Ref Range Status  10/09/2020 252 150 - 400 K/uL Final          Passed - HCT in normal range and within 360 days    HCT  Date Value Ref Range Status  10/09/2020 40.8 39.0 - 52.0 % Final          Passed - HGB in normal range and within 360 days    Hemoglobin  Date Value Ref Range Status  10/09/2020 13.2 13.0 - 17.0 g/dL Final          Passed - Valid encounter within last 12 months    Recent Outpatient Visits           10 months ago Type 2 diabetes mellitus with diabetic polyneuropathy, with long-term current use of insulin (HCC)   [facility] [place], Gavin Pound B, MD   1 year ago Type 2 diabetes mellitus with hyperglycemia, with long-term current use of insulin [facility])   [place] [facility] [place], Jeannett Senior L, RPH-CPP   1 year ago Type 2 diabetes mellitus with hyperglycemia, with long-term current use of insulin (HCC)   [place] [facility] Marcine Matar, MD   1 year ago Encounter for medication counseling   [place] [facility] [place], Cornelius Moras, RPH-CPP   1 year ago Type  2 diabetes mellitus with hyperglycemia, with long-term current use of insulin [facility])   [place] [facility] Marcine Matar, MD       Future Appointments             In 2 weeks Marcine Matar, MD [facility]   In 1 month [place], Tarri Abernethy, PA-C [facility] [employer]               phenytoin (DILANTIN) 100 MG ER capsule 90 capsule 0    Sig: Take 1 capsule (100 mg total) by mouth 3 (three) times daily.      Not Delegated - Neurology:  Anticonvulsants - phenytoin Failed - 10/11/2020  9:02 AM      Failed - This refill cannot be delegated      Failed - WBC in normal range and within 360 days    WBC  Date Value Ref Range Status  10/09/2020 3.9 (L) 4.0 - 10.5 K/uL Final  Failed - Phenytoin (serum) in normal range and within 360 days    Phenytoin Lvl  Date Value Ref Range Status  09/21/2020 7.0 (L) 10.0 - 20.0 ug/mL Final    Comment:    Performed at [facility], [address] [place], [place] [address]          Passed - ALT in normal range and within 360 days    ALT  Date Value Ref Range Status  09/22/2020 42 0 - 44 U/L Final          Passed - AST in normal range and within 360 days    AST  Date Value Ref Range Status  09/22/2020 36 15 - 41 U/L Final          Passed - HGB in normal range and within 360 days    Hemoglobin  Date Value Ref Range Status  10/09/2020 13.2 13.0 - 17.0 g/dL Final          Passed - HCT in normal range and within 360 days    HCT  Date Value Ref Range Status  10/09/2020 40.8 39.0 - 52.0 % Final          Passed - PLT in normal range and within 360 days    Platelets  Date Value Ref Range Status  10/09/2020 252 150 - 400 K/uL Final          Passed - Valid encounter within last 12 months    Recent Outpatient Visits           10 months ago Type 2 diabetes mellitus with diabetic polyneuropathy, with long-term current use of insulin  (HCC)   [facility] [place], Gavin Pound B, MD   1 year ago Type 2 diabetes mellitus with hyperglycemia, with long-term current use of insulin (HCC)   [place] [facility] [place], Jeannett Senior L, RPH-CPP   1 year ago Type 2 diabetes mellitus with hyperglycemia, with long-term current use of insulin (HCC)   [place] [facility] Marcine Matar, MD   1 year ago Encounter for medication counseling   [place] [facility] [place], Cornelius Moras, RPH-CPP   1 year ago Type 2 diabetes mellitus with hyperglycemia, with long-term current use of insulin (HCC)    [facility] Marcine Matar, MD       Future Appointments             In 2 weeks Marcine Matar, MD [facility]   In 1 month [place], Tarri Abernethy, PA-C [facility] [employer]               glipiZIDE (GLUCOTROL) 10 MG tablet 30 tablet 0    Sig: Take 1 tablet (10 mg total) by mouth daily before breakfast. To lower blood sugar      Endocrinology:  Diabetes - Sulfonylureas Failed - 10/11/2020  9:02 AM      Failed - HBA1C is between 0 and 7.9 and within 180 days    HbA1c, POC (controlled diabetic range)  Date Value Ref Range Status  04/26/2019 14.2 (A) 0.0 - 7.0 % Final   Hgb A1c MFr Bld  Date Value Ref Range Status  09/12/2020 >15.5 (H) 4.8 - 5.6 % Final    Comment:    (NOTE)         Prediabetes: 5.7 - 6.4  Diabetes: >6.4         Glycemic control for adults with diabetes: <7.0           Failed - Valid encounter within last 6 months    Recent Outpatient Visits           10 months ago Type 2 diabetes mellitus with diabetic polyneuropathy, with long-term current use of insulin (HCC)   [place] [facility] [place], Gavin Pound B, MD   1 year ago Type 2 diabetes mellitus with hyperglycemia, with long-term current use of insulin  [facility])   [place] [facility] [place], Cornelius Moras, RPH-CPP   1 year ago Type 2 diabetes mellitus with hyperglycemia, with long-term current use of insulin (HCC)   [place] [facility] Marcine Matar, MD   1 year ago Encounter for medication counseling   [facility] [place], Cornelius Moras, RPH-CPP   1 year ago Type 2 diabetes mellitus with hyperglycemia, with long-term current use of insulin [facility])   [place] [facility] Marcine Matar, MD       Future Appointments             In 2 weeks Marcine Matar, MD [facility]   In 1 month [place], Tarri Abernethy, PA-C [facility] [employer]               levETIRAcetam (KEPPRA) 750 MG tablet 120 tablet 0    Sig: Take 2 tablets (1,500 mg total) by mouth 2 (two) times daily.      Not Delegated - Neurology:  Anticonvulsants Failed - 10/11/2020  9:02 AM      Failed - This refill cannot be delegated      Failed - WBC in normal range and within 360 days    WBC  Date Value Ref Range Status  10/09/2020 3.9 (L) 4.0 - 10.5 K/uL Final          Passed - HCT in normal range and within 360 days    HCT  Date Value Ref Range Status  10/09/2020 40.8 39.0 - 52.0 % Final          Passed - HGB in normal range and within 360 days    Hemoglobin  Date Value Ref Range Status  10/09/2020 13.2 13.0 - 17.0 g/dL Final          Passed - PLT in normal range and within 360 days    Platelets  Date Value Ref Range Status  10/09/2020 252 150 - 400 K/uL Final          Passed - Valid encounter within last 12 months    Recent Outpatient Visits           10 months ago Type 2 diabetes mellitus with diabetic polyneuropathy, with long-term current use of insulin (HCC)   [place] [facility] [place], Gavin Pound B, MD   1 year ago Type 2 diabetes mellitus with hyperglycemia, with long-term  current use of insulin [facility])   [place] [facility] [place], Cornelius Moras, RPH-CPP   1 year ago Type 2 diabetes mellitus with hyperglycemia, with long-term current use of insulin [facility])   [place] [facility] Marcine Matar, MD   1 year ago Encounter for medication counseling   [facility] [place], Cornelius Moras, RPH-CPP  1 year ago Type 2 diabetes mellitus with hyperglycemia, with long-term current use of insulin [facility])   [place] [facility] Marcine Matar, MD       Future Appointments             In 2 weeks [place] Binnie Rail, MD [facility]   In 1 month [place], Tarri Abernethy, PA-C [facility] [employer]

## 2020-10-12 ENCOUNTER — Other Ambulatory Visit (HOSPITAL_COMMUNITY): Payer: Self-pay

## 2020-10-12 ENCOUNTER — Other Ambulatory Visit: Payer: Self-pay

## 2020-10-14 ENCOUNTER — Other Ambulatory Visit: Payer: Self-pay | Admitting: Internal Medicine

## 2020-10-14 DIAGNOSIS — Z794 Long term (current) use of insulin: Secondary | ICD-10-CM

## 2020-10-14 DIAGNOSIS — E1142 Type 2 diabetes mellitus with diabetic polyneuropathy: Secondary | ICD-10-CM

## 2020-10-14 MED ORDER — OXCARBAZEPINE 300 MG PO TABS
600.0000 mg | ORAL_TABLET | Freq: Two times a day (BID) | ORAL | 2 refills | Status: DC
  Filled 2020-10-14: qty 120, 30d supply, fill #0
  Filled 2020-11-13: qty 120, 30d supply, fill #1
  Filled 2020-12-11 – 2020-12-19 (×2): qty 120, 30d supply, fill #2

## 2020-10-14 MED ORDER — NOVOLIN 70/30 FLEXPEN (70-30) 100 UNIT/ML ~~LOC~~ SUPN
22.0000 [IU] | PEN_INJECTOR | Freq: Two times a day (BID) | SUBCUTANEOUS | 5 refills | Status: DC
  Filled 2020-10-14: qty 6, 27d supply, fill #0

## 2020-10-14 MED ORDER — LEVETIRACETAM 750 MG PO TABS
1500.0000 mg | ORAL_TABLET | Freq: Two times a day (BID) | ORAL | 2 refills | Status: DC
  Filled 2020-10-14: qty 120, 30d supply, fill #0
  Filled 2020-11-13: qty 120, 30d supply, fill #1
  Filled 2020-12-19: qty 120, 30d supply, fill #2

## 2020-10-14 MED ORDER — GLIPIZIDE 10 MG PO TABS
10.0000 mg | ORAL_TABLET | Freq: Every day | ORAL | 5 refills | Status: DC
  Filled 2020-10-14: qty 30, 30d supply, fill #0

## 2020-10-14 MED ORDER — PHENYTOIN SODIUM EXTENDED 100 MG PO CAPS
100.0000 mg | ORAL_CAPSULE | Freq: Three times a day (TID) | ORAL | 2 refills | Status: DC
  Filled 2020-10-14: qty 90, 30d supply, fill #0
  Filled 2020-11-13: qty 90, 30d supply, fill #1
  Filled 2020-12-11 – 2020-12-19 (×2): qty 90, 30d supply, fill #2

## 2020-10-17 ENCOUNTER — Other Ambulatory Visit: Payer: Self-pay

## 2020-10-17 ENCOUNTER — Other Ambulatory Visit (HOSPITAL_COMMUNITY): Payer: Self-pay

## 2020-10-17 ENCOUNTER — Ambulatory Visit (INDEPENDENT_AMBULATORY_CARE_PROVIDER_SITE_OTHER): Payer: Medicaid Other | Admitting: *Deleted

## 2020-10-17 DIAGNOSIS — I513 Intracardiac thrombosis, not elsewhere classified: Secondary | ICD-10-CM | POA: Diagnosis not present

## 2020-10-17 DIAGNOSIS — Z7901 Long term (current) use of anticoagulants: Secondary | ICD-10-CM

## 2020-10-17 DIAGNOSIS — Z5181 Encounter for therapeutic drug level monitoring: Secondary | ICD-10-CM | POA: Diagnosis not present

## 2020-10-17 DIAGNOSIS — I639 Cerebral infarction, unspecified: Secondary | ICD-10-CM

## 2020-10-17 LAB — POCT INR: INR: 1.5 — AB (ref 2.0–3.0)

## 2020-10-17 NOTE — Patient Instructions (Signed)
Description   Start taking warfarin 1 tablet daily except for 1.5 tablets on Tuesday and Thursday. Recheck INR in 1 week. Coumadin Clinic [phone].

## 2020-10-17 NOTE — Progress Notes (Signed)
Paramedicine Encounter    Patient ID: Jason Stevenson, male    DOB: 1985/08/21, 35 y.o.   MRN: [id number]   Patient Care Team: Ladell Pier, MD as PCP - General (Internal Medicine) Josue Hector, MD as PCP - Cardiology (Cardiology)  Patient Active Problem List   Diagnosis Date Noted   Protein-calorie malnutrition, severe 09/22/2020   Acute CVA (cerebrovascular accident) ([place]) 09/21/2020   Near syncope 09/21/2020   Encounter for monitoring Coumadin therapy 09/20/2020   LV (left ventricular) mural thrombus 09/13/2020   Seizure ([place]) 09/13/2020   Neurological deficit present 09/12/2020   History of COVID-19 09/12/2020   Intracerebral hemorrhage 09/12/2020   Acute cerebrovascular accident (CVA) ([place]) 09/12/2020   Hyperosmolar hyperglycemic state (HHS) ([place]) 09/11/2020   COVID-19 08/23/2020   Noncompliance with medication treatment due to intermittent use of medication 10/12/2018   Dyslipidemia 04/17/2016   Uncontrolled type 2 diabetes mellitus without complication, with long-term current use of insulin 93/79/0240   Chronic systolic CHF (congestive heart failure) ([place]) 06/18/2015   Needs flu shot 12/23/2013   Type 2 diabetes mellitus ([place]) 11/18/2013   Diabetes ([place]) 06/17/2013   Non-ischemic cardiomyopathy ([place]) 11/06/2012   HTN (hypertension) 11/06/2012   HLD (hyperlipidemia) 11/06/2012    Current Outpatient Medications:    acetaminophen (TYLENOL) 500 MG tablet, Take 1,500 mg by mouth every 6 (six) hours as needed for moderate pain or headache., Disp: , Rfl:    aspirin 81 MG EC tablet, Take 1 tablet (81 mg total) by mouth daily. Swallow whole., Disp: 30 tablet, Rfl: 2   atorvastatin (LIPITOR) 80 MG tablet, Take 1 tablet (80 mg total) by mouth daily., Disp: 30 tablet, Rfl: 2   Blood Glucose Monitoring Suppl (TRUE METRIX METER) w/Device KIT, 1 each by Does not apply route 3 (three) times daily., Disp: 1 kit, Rfl: 0   Blood Pressure Monitor DEVI, Use as directed to check  home blood pressure 2-3 times a week, Disp: 1 Device, Rfl: 0   digoxin (LANOXIN) 0.125 MG tablet, Take 1 tablet (0.125 mg total) by mouth daily., Disp: 30 tablet, Rfl: 2   ezetimibe (ZETIA) 10 MG tablet, Take 1 tablet (10 mg total) by mouth daily., Disp: 30 tablet, Rfl: 2   furosemide (LASIX) 20 MG tablet, Take 1 tablet (20 mg total) by mouth daily., Disp: 30 tablet, Rfl: 2   glipiZIDE (GLUCOTROL) 10 MG tablet, Take 1 tablet (10 mg total) by mouth daily before breakfast. To lower blood sugar, Disp: 30 tablet, Rfl: 5   glucose blood test strip, Use as instructed, Disp: 100 each, Rfl: 12   insulin isophane & regular human (NOVOLIN 70/30 FLEXPEN) (70-30) 100 UNIT/ML KwikPen, Inject 22 Units into the skin in the morning and at bedtime., Disp: 13.2 mL, Rfl: 5   Insulin Pen Needle (PEN NEEDLES) 31G X 8 MM MISC, UAD, Disp: 100 each, Rfl: 6   Insulin Pen Needle 32G X 4 MM MISC, Use in the morning, at noon, and at bedtime., Disp: 100 each, Rfl: 0   Insulin Syringe-Needle U-100 (RELION INSULIN SYRINGE) 31G X 15/64" 0.3 ML MISC, Use to inject insulin daily., Disp: 100 each, Rfl: 11   ivabradine (CORLANOR) 7.5 MG TABS tablet, Take 1 tablet (7.5 mg total) by mouth 2 (two) times daily with a meal., Disp: 60 tablet, Rfl: 0   levETIRAcetam (KEPPRA) 750 MG tablet, Take 2 tablets (1,500 mg total) by mouth 2 (two) times daily., Disp: 120 tablet, Rfl: 2   losartan (COZAAR) 25 MG tablet, Take  1/2 tablet (12.5 mg total) by mouth daily., Disp: 15 tablet, Rfl: 2   metFORMIN (GLUCOPHAGE) 500 MG tablet, Take 1 tablet (500 mg total) by mouth 2 (two) times daily with a meal. TAKE 1 TABLET BY MOUTH 2 TIMES DAILY WITH A MEAL (Patient taking differently: Take 500 mg by mouth 2 (two) times daily with a meal.), Disp: 60 tablet, Rfl: 6   metoprolol (TOPROL-XL) 200 MG 24 hr tablet, Take 1 tablet (200 mg total) by mouth daily. Take with or immediately following a meal., Disp: 30 tablet, Rfl: 2   nitroGLYCERIN (NITROSTAT) 0.4 MG SL  tablet, Place 1 tablet (0.4 mg total) under the tongue every 5 (five) minutes as needed for chest pain., Disp: 25 tablet, Rfl: 2   Oxcarbazepine (TRILEPTAL) 300 MG tablet, Take 2 tablets (600 mg total) by mouth 2 (two) times daily., Disp: 120 tablet, Rfl: 2   phenytoin (DILANTIN) 100 MG ER capsule, Take 1 capsule (100 mg total) by mouth 3 (three) times daily., Disp: 90 capsule, Rfl: 2   spironolactone (ALDACTONE) 25 MG tablet, Take 1/2 tablet (12.5 mg total) by mouth daily., Disp: 15 tablet, Rfl: 2   warfarin (COUMADIN) 5 MG tablet, Take 1 tablet (5 mg total) by mouth daily., Disp: 30 tablet, Rfl: 2 No Known Allergies   Social History   Socioeconomic History   Marital status: Married    Spouse name: Not on file   Number of children: Not on file   Years of education: Not on file   Highest education level: Not on file  Occupational History   Occupation: unemployed  Tobacco Use   Smoking status: Never   Smokeless tobacco: Never  Vaping Use   Vaping Use: Not on file  Substance and Sexual Activity   Alcohol use: Not Currently    Comment: "occasional" when "hanging out with the wrong people" No recent use.   Drug use: Not Currently    Types: Marijuana    Comment: occasional, last 2013   Sexual activity: Not Currently  Other Topics Concern   Not on file  Social History Narrative   Pt lives alone. Has no information on father.   Social Determinants of Health   Financial Resource Strain: High Risk   Difficulty of Paying Living Expenses: Hard  Food Insecurity: No Food Insecurity   Worried About Charity fundraiser in the Last Year: Never true   Ran Out of Food in the Last Year: Never true  Transportation Needs: Unmet Transportation Needs   Lack of Transportation (Medical): Yes   Lack of Transportation (Non-Medical): Yes  Physical Activity: Not on file  Stress: Not on file  Social Connections: Not on file  Intimate Partner Violence: Not on file    Physical Exam  Arrived for  visit  for Mr. Statzer. We reviewed vitals and he reports feeling well with no complaints today. He did state he was having some chest pain over the weekend in which he feels may be related to anxiety surrounding possible erectile dysfunction. He stated since being placed on several heart medications he notices that he is having troubles with his intimacy with his wife and wished to address same with PCP and HF MD. He denied dizziness, cold sweats or N/V with same. Gaylen stated he will inform his doctors accordingly. If CP persists I advised him to let me know. He agreed with plan.  We reviewed medications and filled pill box accordingly. We also reviewed upcoming appointments. [place] will set up his  own transportation. HE also knows to pick up 6 meds at [place] on Thursday. Home visit complete. I will see Khole in one week.   Refills: Metformin Keppra Oxycarb. Phenytoin Glipizide Novolin   CBG @ 1000- 137 CBG @ 1450- 72     Future Appointments  Date Time Provider [place]  10/24/2020 11:30 AM [facility]  10/24/2020  2:45 PM [facility]  10/27/2020  9:30 AM Ladell Pier, MD [facility] None  11/09/2020  1:00 PM [place] [facility] None  11/14/2020 11:45 AM Imogene Burn, PA-C [facility]  12/04/2020 10:00 AM [facility] PA/NP [facility] None  12/20/2020  9:00 AM Pen[place]alli, Earlean Polka, MD [facility] None     ACTION: Home visit completed

## 2020-10-18 ENCOUNTER — Other Ambulatory Visit: Payer: Self-pay | Admitting: Internal Medicine

## 2020-10-18 ENCOUNTER — Other Ambulatory Visit: Payer: Self-pay

## 2020-10-18 DIAGNOSIS — Z794 Long term (current) use of insulin: Secondary | ICD-10-CM

## 2020-10-18 DIAGNOSIS — E1142 Type 2 diabetes mellitus with diabetic polyneuropathy: Secondary | ICD-10-CM

## 2020-10-18 MED ORDER — METFORMIN HCL 500 MG PO TABS
500.0000 mg | ORAL_TABLET | Freq: Two times a day (BID) | ORAL | 4 refills | Status: DC
  Filled 2020-10-18: qty 60, 30d supply, fill #0
  Filled 2020-11-13 – 2020-11-20 (×2): qty 60, 30d supply, fill #1
  Filled 2020-12-26: qty 60, 30d supply, fill #2
  Filled 2021-01-30: qty 60, 30d supply, fill #3
  Filled 2021-03-02: qty 60, 30d supply, fill #4

## 2020-10-19 ENCOUNTER — Other Ambulatory Visit: Payer: Self-pay

## 2020-10-23 ENCOUNTER — Other Ambulatory Visit (HOSPITAL_COMMUNITY): Payer: Self-pay

## 2020-10-23 ENCOUNTER — Other Ambulatory Visit (HOSPITAL_COMMUNITY): Payer: Self-pay | Admitting: *Deleted

## 2020-10-23 MED ORDER — IVABRADINE HCL 7.5 MG PO TABS: 7.5000 mg | ORAL_TABLET | Freq: Two times a day (BID) | ORAL | Status: DC

## 2020-10-23 NOTE — Progress Notes (Signed)
Paramedicine Encounter    Patient ID: Jason Stevenson, male    DOB: 22-Dec-1985, 35 y.o.   MRN: [id number]   Patient Care Team: Ladell Pier, MD as PCP - General (Internal Medicine) Josue Hector, MD as PCP - Cardiology (Cardiology)  Patient Active Problem List   Diagnosis Date Noted   Protein-calorie malnutrition, severe 09/22/2020   Acute CVA (cerebrovascular accident) ([place]) 09/21/2020   Near syncope 09/21/2020   Encounter for monitoring Coumadin therapy 09/20/2020   LV (left ventricular) mural thrombus 09/13/2020   Seizure ([place]) 09/13/2020   Neurological deficit present 09/12/2020   History of COVID-19 09/12/2020   Intracerebral hemorrhage 09/12/2020   Acute cerebrovascular accident (CVA) ([place]) 09/12/2020   Hyperosmolar hyperglycemic state (HHS) ([place]) 09/11/2020   COVID-19 08/23/2020   Noncompliance with medication treatment due to intermittent use of medication 10/12/2018   Dyslipidemia 04/17/2016   Uncontrolled type 2 diabetes mellitus without complication, with long-term current use of insulin 78/67/5449   Chronic systolic CHF (congestive heart failure) ([place]) 06/18/2015   Needs flu shot 12/23/2013   Type 2 diabetes mellitus ([place]) 11/18/2013   Diabetes ([place]) 06/17/2013   Non-ischemic cardiomyopathy ([place]) 11/06/2012   HTN (hypertension) 11/06/2012   HLD (hyperlipidemia) 11/06/2012    Current Outpatient Medications:    acetaminophen (TYLENOL) 500 MG tablet, Take 1,500 mg by mouth every 6 (six) hours as needed for moderate pain or headache., Disp: , Rfl:    aspirin 81 MG EC tablet, Take 1 tablet (81 mg total) by mouth daily. Swallow whole., Disp: 30 tablet, Rfl: 2   atorvastatin (LIPITOR) 80 MG tablet, Take 1 tablet (80 mg total) by mouth daily., Disp: 30 tablet, Rfl: 2   Blood Glucose Monitoring Suppl (TRUE METRIX METER) w/Device KIT, 1 each by Does not apply route 3 (three) times daily., Disp: 1 kit, Rfl: 0   Blood Pressure Monitor DEVI, Use as directed to check  home blood pressure 2-3 times a week, Disp: 1 Device, Rfl: 0   digoxin (LANOXIN) 0.125 MG tablet, Take 1 tablet (0.125 mg total) by mouth daily., Disp: 30 tablet, Rfl: 2   ezetimibe (ZETIA) 10 MG tablet, Take 1 tablet (10 mg total) by mouth daily., Disp: 30 tablet, Rfl: 2   furosemide (LASIX) 20 MG tablet, Take 1 tablet (20 mg total) by mouth daily., Disp: 30 tablet, Rfl: 2   glipiZIDE (GLUCOTROL) 10 MG tablet, Take 1 tablet (10 mg total) by mouth daily before breakfast. To lower blood sugar, Disp: 30 tablet, Rfl: 5   glucose blood test strip, Use as instructed, Disp: 100 each, Rfl: 12   insulin isophane & regular human (NOVOLIN 70/30 FLEXPEN) (70-30) 100 UNIT/ML KwikPen, Inject 22 Units into the skin in the morning and at bedtime., Disp: 13.2 mL, Rfl: 5   Insulin Pen Needle (PEN NEEDLES) 31G X 8 MM MISC, UAD, Disp: 100 each, Rfl: 6   Insulin Pen Needle 32G X 4 MM MISC, Use in the morning, at noon, and at bedtime., Disp: 100 each, Rfl: 0   Insulin Syringe-Needle U-100 (RELION INSULIN SYRINGE) 31G X 15/64" 0.3 ML MISC, Use to inject insulin daily., Disp: 100 each, Rfl: 11   levETIRAcetam (KEPPRA) 750 MG tablet, Take 2 tablets (1,500 mg total) by mouth 2 (two) times daily., Disp: 120 tablet, Rfl: 2   losartan (COZAAR) 25 MG tablet, Take 1/2 tablet (12.5 mg total) by mouth daily., Disp: 15 tablet, Rfl: 2   metFORMIN (GLUCOPHAGE) 500 MG tablet, Take 1 tablet (500 mg total) by mouth  2 (two) times daily with a meal., Disp: 60 tablet, Rfl: 4   metoprolol (TOPROL-XL) 200 MG 24 hr tablet, Take 1 tablet (200 mg total) by mouth daily. Take with or immediately following a meal., Disp: 30 tablet, Rfl: 2   nitroGLYCERIN (NITROSTAT) 0.4 MG SL tablet, Place 1 tablet (0.4 mg total) under the tongue every 5 (five) minutes as needed for chest pain., Disp: 25 tablet, Rfl: 2   Oxcarbazepine (TRILEPTAL) 300 MG tablet, Take 2 tablets (600 mg total) by mouth 2 (two) times daily., Disp: 120 tablet, Rfl: 2   phenytoin  (DILANTIN) 100 MG ER capsule, Take 1 capsule (100 mg total) by mouth 3 (three) times daily., Disp: 90 capsule, Rfl: 2   spironolactone (ALDACTONE) 25 MG tablet, Take 1/2 tablet (12.5 mg total) by mouth daily., Disp: 15 tablet, Rfl: 2   warfarin (COUMADIN) 5 MG tablet, Take 1 tablet (5 mg total) by mouth daily., Disp: 30 tablet, Rfl: 2 No Known Allergies   Social History   Socioeconomic History   Marital status: Married    Spouse name: Not on file   Number of children: Not on file   Years of education: Not on file   Highest education level: Not on file  Occupational History   Occupation: unemployed  Tobacco Use   Smoking status: Never   Smokeless tobacco: Never  Vaping Use   Vaping Use: Not on file  Substance and Sexual Activity   Alcohol use: Not Currently    Comment: "occasional" when "hanging out with the wrong people" No recent use.   Drug use: Not Currently    Types: Marijuana    Comment: occasional, last 2013   Sexual activity: Not Currently  Other Topics Concern   Not on file  Social History Narrative   Pt lives alone. Has no information on father.   Social Determinants of Health   Financial Resource Strain: High Risk   Difficulty of Paying Living Expenses: Hard  Food Insecurity: No Food Insecurity   Worried About Charity fundraiser in the Last Year: Never true   Ran Out of Food in the Last Year: Never true  Transportation Needs: Unmet Transportation Needs   Lack of Transportation (Medical): Yes   Lack of Transportation (Non-Medical): Yes  Physical Activity: Not on file  Stress: Not on file  Social Connections: Not on file  Intimate Partner Violence: Not on file    Physical Exam Vitals reviewed.  Constitutional:      Appearance: Normal appearance. He is normal weight.  HENT:     Head: Normocephalic.     Nose: Nose normal.     Mouth/Throat:     Mouth: Mucous membranes are moist.     Pharynx: Oropharynx is clear.  Eyes:     Conjunctiva/sclera:  Conjunctivae normal.     Pupils: Pupils are equal, round, and reactive to light.  Cardiovascular:     Rate and Rhythm: Normal rate and regular rhythm.     Pulses: Normal pulses.     Heart sounds: Normal heart sounds.  Pulmonary:     Effort: Pulmonary effort is normal. No respiratory distress.     Breath sounds: Normal breath sounds. No stridor. No wheezing, rhonchi or rales.  Abdominal:     General: Abdomen is flat.     Palpations: Abdomen is soft.  Musculoskeletal:        General: Normal range of motion.     Cervical back: Normal range of motion.  Right lower leg: No edema.     Left lower leg: No edema.  Skin:    General: Skin is warm and dry.     Capillary Refill: Capillary refill takes less than 2 seconds.  Neurological:     General: No focal deficit present.     Mental Status: He is alert. Mental status is at baseline.  Psychiatric:        Mood and Affect: Mood normal.   -Home visit with Mr. Ledyard today who reports feeling good with no complaints of dizziness, shortness of breath or chest pain.   -Vitals were obtained and reviewed.  BP: 110/68 HR: 98 CBG: 160  O2: 100%   -Medications reviewed and confirmed. One pill box filled needing Corlanor before next week. I will get samples and bring out to fill up box.   -Reports CBG's have been less than 240 consistently including CBG's around the 90's in the mornings.   -Reports arthritic pain in the evening preventing him from sleep. Adding 1054m Tylenol in the bedtime slot in his pill box this week.   -No signs of fluid retention.   -[employer] agreed to keep power on as long as balance was being paid down.   -No food insecurity at this time. Reminded of daily diet suggestions.  -Appointments reviewed.    I will be back for complete home visit in one week.   Refills:CORLANOR (samples from clinic)  Future Appointments  Date Time Provider [place] 10/24/2020 11:30 AM [facility]  [facility]  10/24/2020  2:45 PM [facility]  10/27/2020  9:30 AM JLadell Pier MD [facility] None  11/09/2020  1:00 PM MLos BerrosNone  11/14/2020 11:45 AM LImogene Burn PA-C [facility]  12/04/2020 10:00 AM [facility] PA/NP [facility] None  12/20/2020  9:00 AM [place], VEarlean Polka MD [facility] None     ACTION: Home visit completed

## 2020-10-24 ENCOUNTER — Other Ambulatory Visit: Payer: Self-pay

## 2020-10-24 ENCOUNTER — Ambulatory Visit (INDEPENDENT_AMBULATORY_CARE_PROVIDER_SITE_OTHER): Payer: Medicaid Other

## 2020-10-24 ENCOUNTER — Other Ambulatory Visit: Payer: Self-pay | Admitting: *Deleted

## 2020-10-24 DIAGNOSIS — E785 Hyperlipidemia, unspecified: Secondary | ICD-10-CM

## 2020-10-24 DIAGNOSIS — I639 Cerebral infarction, unspecified: Secondary | ICD-10-CM

## 2020-10-24 DIAGNOSIS — Z7901 Long term (current) use of anticoagulants: Secondary | ICD-10-CM | POA: Diagnosis not present

## 2020-10-24 DIAGNOSIS — Z5181 Encounter for therapeutic drug level monitoring: Secondary | ICD-10-CM | POA: Diagnosis not present

## 2020-10-24 DIAGNOSIS — I513 Intracardiac thrombosis, not elsewhere classified: Secondary | ICD-10-CM | POA: Diagnosis not present

## 2020-10-24 LAB — LIPID PANEL
Chol/HDL Ratio: 2.7 ratio (ref 0.0–5.0)
Cholesterol, Total: 181 mg/dL (ref 100–199)
HDL: 67 mg/dL (ref >39)
LDL Chol Calc (NIH): 94 mg/dL (ref 0–99)
Triglycerides: 113 mg/dL (ref 0–149)
VLDL Cholesterol Cal: 20 mg/dL (ref 5–40)

## 2020-10-24 LAB — POCT INR: INR: 1.8 — AB (ref 2.0–3.0)

## 2020-10-24 NOTE — Patient Instructions (Signed)
Description   Take 2 tablets of Warfarin today, then start taking 1 tablet daily except for 1.5 tablets on Tuesdays, Thursdays, and Saturdays. Recheck INR in 1 week. Cou[facility]in Clinic [phone].

## 2020-10-25 ENCOUNTER — Telehealth (HOSPITAL_COMMUNITY): Payer: Self-pay | Admitting: *Deleted

## 2020-10-25 ENCOUNTER — Telehealth: Payer: Self-pay | Admitting: Pharmacist

## 2020-10-25 NOTE — Telephone Encounter (Signed)
Pt awaiting approval for Corlanor assistance and needs samples. Samples provided to pt's community paramedic to take to pt   Medication Samples have been provided to the patient.  Drug name: Corlanor       Strength: 5mg         Qty: 2  LOT  Exp.Date: 7/25  Dosing instructions: Take 1 & 1/2 tabs Twice daily   The patient has been instructed regarding the correct time, dose, and frequency of taking this medication, including desired effects and most common side effects.   Jason Stevenson 3:10 PM 10/25/2020

## 2020-10-25 NOTE — Telephone Encounter (Signed)
Reviewed patients labs with him. He provided me with some information for his [employer] pt assistance application. Application was faxed to [employer].

## 2020-10-26 NOTE — Telephone Encounter (Signed)
Received call from [employer] that information was missing from application. Section 6 was not filled out. I have completed this and refaxed application.

## 2020-10-27 ENCOUNTER — Encounter: Payer: Self-pay | Admitting: Internal Medicine

## 2020-10-27 ENCOUNTER — Other Ambulatory Visit: Payer: Self-pay

## 2020-10-27 ENCOUNTER — Other Ambulatory Visit: Payer: Self-pay | Admitting: Internal Medicine

## 2020-10-27 ENCOUNTER — Ambulatory Visit: Payer: Medicaid Other | Attending: Internal Medicine | Admitting: Internal Medicine

## 2020-10-27 VITALS — BP 123/81 | HR 96 | Resp 16 | Wt 159.8 lb

## 2020-10-27 DIAGNOSIS — I513 Intracardiac thrombosis, not elsewhere classified: Secondary | ICD-10-CM | POA: Diagnosis not present

## 2020-10-27 DIAGNOSIS — I6930 Unspecified sequelae of cerebral infarction: Secondary | ICD-10-CM | POA: Diagnosis not present

## 2020-10-27 DIAGNOSIS — E1142 Type 2 diabetes mellitus with diabetic polyneuropathy: Secondary | ICD-10-CM

## 2020-10-27 DIAGNOSIS — I152 Hypertension secondary to endocrine disorders: Secondary | ICD-10-CM | POA: Diagnosis not present

## 2020-10-27 DIAGNOSIS — G40909 Epilepsy, unspecified, not intractable, without status epilepticus: Secondary | ICD-10-CM | POA: Diagnosis not present

## 2020-10-27 DIAGNOSIS — N529 Male erectile dysfunction, unspecified: Secondary | ICD-10-CM | POA: Diagnosis not present

## 2020-10-27 DIAGNOSIS — Z794 Long term (current) use of insulin: Secondary | ICD-10-CM | POA: Diagnosis not present

## 2020-10-27 DIAGNOSIS — I428 Other cardiomyopathies: Secondary | ICD-10-CM

## 2020-10-27 DIAGNOSIS — E1159 Type 2 diabetes mellitus with other circulatory complications: Secondary | ICD-10-CM

## 2020-10-27 LAB — GLUCOSE, POCT (MANUAL RESULT ENTRY): POC Glucose: 124 mg/dl — AB (ref 70–99)

## 2020-10-27 MED ORDER — NITROGLYCERIN 0.4 MG SL SUBL
0.4000 mg | SUBLINGUAL_TABLET | SUBLINGUAL | 2 refills | Status: DC | PRN
  Filled 2020-10-27: qty 25, 8d supply, fill #0

## 2020-10-27 MED ORDER — NOVOLIN 70/30 FLEXPEN (70-30) 100 UNIT/ML ~~LOC~~ SUPN
PEN_INJECTOR | SUBCUTANEOUS | 5 refills | Status: DC
  Filled 2020-10-27: qty 13.2, fill #0
  Filled 2020-11-21: qty 12, 30d supply, fill #0
  Filled 2020-12-26: qty 12, 30d supply, fill #1

## 2020-10-27 MED ORDER — PREGABALIN 25 MG PO CAPS
25.0000 mg | ORAL_CAPSULE | Freq: Two times a day (BID) | ORAL | 3 refills | Status: DC
  Filled 2020-10-27 – 2020-11-03 (×2): qty 60, 30d supply, fill #0

## 2020-10-27 MED ORDER — SILDENAFIL CITRATE 50 MG PO TABS
ORAL_TABLET | ORAL | 4 refills | Status: DC
  Filled 2020-10-27: qty 10, 30d supply, fill #0

## 2020-10-27 NOTE — Progress Notes (Signed)
Patient ID: Nassim Cosma, male    DOB: 1985/06/06  MRN: [id number]  CC: Hospitalization Follow-up   Subjective: Cuauhtemoc Huegel is a 35 y.o. male who presents for hosp f/u and chronic ds management His concerns today include:  Patient with history of DM, HTN, HL, NICM with systolic CHF EF 62-03%, Cardiologist Dr. 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  He was found to have subacute hemorrhagic infarcts involving the anterior right frontal lobe and left parieto-occipital cortex with chronic right parietal infarct on brain MRI. Transthoracic echo revealed EF of 10 to 15%, LV thrombus, moderate RV dysfunction and dilated IVC.  GDMT included metoprolol, Cozaar, Aldactone, furosemide and Corlanor.  Continued on statin and Zetia.  Plan was to follow-up with CHF clinic.  Patient also started on Coumadin. -Intermittent seizure activity confirmed on continuous EEG.  Likely due to CVA.  He was started on Keppra, Dilantin and Trileptal by neurology. -DM: A1c was greater than 15.5.  He was discharged on Novolin 70/30 at 25 units twice a day, continued on metformin, glipizide dose decreased to once a day.  Patient hospitalized again on 6/9-01/2021 with vasovagal syncope that occurred while he was using the toilet.  He was seen by neurology.  EEG was unremarkable.  MRI of the head showed old CVA with no new findings.  Patient was advised on orthostatic precautions and was discharged home in a stable condition.  Today: NICM/HTN/LV thrombus:  Since hospital discharge, he has followed up with the cardiology nurse practitioner.  He denies any chest pains, shortness of breath or lower extremity edema.  He denies any bruising or bleeding on Coumadin.  INR level is checked through cardiology clinic.  Reports that his blood  pressure reading has been good.  He is compliant with medications.  He has medicines with him today and our clinical pharmacist did a med reconciliation.  This confirms that he is taking Cozaar, spironolactone, furosemide, metoprolol, warfarin  CVA: Since hospital discharge, he has seen physical therapy, speech therapy and occupational therapy once.  He feels he would benefit from more physical therapy as some days he feels his balance is off.  However he is uninsured.  Also reports decreased memory since the stroke.  He reports that his wife has applied for the orange card/cone discount card for him and they are awaiting approval.  Once approved he would like to continue physical therapy.  Compliant with Zetia, atorvastatin and baby aspirin.  Latest LDL cholesterol was 94.  He has not had any falls.  No further syncope. -No seizures since hospital discharge.  He is compliant with taking the 3 seizure medications Dilantin, Keppra and Trileptal.  DM:  Lab Results  Component Value Date   HGBA1C >15.5 (H) 09/12/2020   Results for orders placed or performed in visit on 10/27/20  POCT glucose (manual entry)  Result Value Ref Range   POC Glucose 124 (A) 70 - 99 mg/dl   Checking blood sugars 3-4 times a day before meals. Reports his blood sugars are getting better.  He has recorded his blood sugar, blood pressure and pulse reading daily on his phone and it is a bit time-consuming going through each day for review.  He reports his highest reading has been 325.  He has had some low blood sugars in the afternoons and evenings in the 60s and 70s.  He was  initially on Novolin 70/30 22 units twice a day but this was cut back to 22 units in the morning and 18 units in the evening because he was having frequent low blood sugars. -Trying to do better with eating habits. Complains of increased sensitivity in the hands and pain in the feet that interferes with sleep.  Complains of problems getting and maintaining  erections since being on his medications.  He is wondering if there is something that I can prescribe to help him.  I see that he has sublingual nitroglycerin on his medication list.  Patient states the cardiologist had written that for him a long time ago to use if he ever has chest pains but states that he has never used it.    Patient Active Problem List   Diagnosis Date Noted   Protein-calorie malnutrition, severe 09/22/2020   Acute CVA (cerebrovascular accident) ([place]) 09/21/2020   Near syncope 09/21/2020   Encounter for monitoring Coumadin therapy 09/20/2020   LV (left ventricular) mural thrombus 09/13/2020   Seizure ([place]) 09/13/2020   Neurological deficit present 09/12/2020   History of COVID-19 09/12/2020   Intracerebral hemorrhage 09/12/2020   Acute cerebrovascular accident (CVA) ([place]) 09/12/2020   Hyperosmolar hyperglycemic state (HHS) ([place]) 09/11/2020   COVID-19 08/23/2020   Noncompliance with medication treatment due to intermittent use of medication 10/12/2018   Dyslipidemia 04/17/2016   Uncontrolled type 2 diabetes mellitus without complication, with long-term current use of insulin 96/22/2979   Chronic systolic CHF (congestive heart failure) ([place]) 06/18/2015   Needs flu shot 12/23/2013   Type 2 diabetes mellitus ([place]) 11/18/2013   Diabetes ([place]) 06/17/2013   Non-ischemic cardiomyopathy ([place]) 11/06/2012   HTN (hypertension) 11/06/2012   HLD (hyperlipidemia) 11/06/2012     Current Outpatient Medications on File Prior to Visit  Medication Sig Dispense Refill   acetaminophen (TYLENOL) 500 MG tablet Take 1,500 mg by mouth every 6 (six) hours as needed for moderate pain or headache.     aspirin 81 MG EC tablet Take 1 tablet (81 mg total) by mouth daily. Swallow whole. 30 tablet 2   atorvastatin (LIPITOR) 80 MG tablet Take 1 tablet (80 mg total) by mouth daily. 30 tablet 2   Blood Glucose Monitoring Suppl (TRUE METRIX METER) w/Device KIT 1 each by Does not apply route 3  (three) times daily. 1 kit 0   Blood Pressure Monitor DEVI Use as directed to check home blood pressure 2-3 times a week 1 Device 0   digoxin (LANOXIN) 0.125 MG tablet Take 1 tablet (0.125 mg total) by mouth daily. 30 tablet 2   ezetimibe (ZETIA) 10 MG tablet Take 1 tablet (10 mg total) by mouth daily. 30 tablet 2   furosemide (LASIX) 20 MG tablet Take 1 tablet (20 mg total) by mouth daily. 30 tablet 2   glipiZIDE (GLUCOTROL) 10 MG tablet Take 1 tablet (10 mg total) by mouth daily before breakfast. To lower blood sugar 30 tablet 5   glucose blood test strip Use as instructed 100 each 12   insulin isophane & regular human (NOVOLIN 70/30 FLEXPEN) (70-30) 100 UNIT/ML KwikPen Inject 22 Units into the skin in the morning and at bedtime. 13.2 mL 5   Insulin Pen Needle (PEN NEEDLES) 31G X 8 MM MISC UAD 100 each 6   Insulin Pen Needle 32G X 4 MM MISC Use in the morning, at noon, and at bedtime. 100 each 0   Insulin Syringe-Needle U-100 (RELION INSULIN SYRINGE) 31G X 15/64" 0.3 ML MISC Use  to inject insulin daily. (Patient not taking: Reported on 10/27/2020) 100 each 11   ivabradine (CORLANOR) 7.5 MG TABS tablet Take 1 tablet (7.5 mg total) by mouth 2 (two) times daily with a meal. 60 tablet    levETIRAcetam (KEPPRA) 750 MG tablet Take 2 tablets (1,500 mg total) by mouth 2 (two) times daily. 120 tablet 2   losartan (COZAAR) 25 MG tablet Take 1/2 tablet (12.5 mg total) by mouth daily. 15 tablet 2   metFORMIN (GLUCOPHAGE) 500 MG tablet Take 1 tablet (500 mg total) by mouth 2 (two) times daily with a meal. 60 tablet 4   metoprolol (TOPROL-XL) 200 MG 24 hr tablet Take 1 tablet (200 mg total) by mouth daily. Take with or immediately following a meal. 30 tablet 2   nitroGLYCERIN (NITROSTAT) 0.4 MG SL tablet Place 1 tablet (0.4 mg total) under the tongue every 5 (five) minutes as needed for chest pain. 25 tablet 2   Oxcarbazepine (TRILEPTAL) 300 MG tablet Take 2 tablets (600 mg total) by mouth 2 (two) times daily.  120 tablet 2   phenytoin (DILANTIN) 100 MG ER capsule Take 1 capsule (100 mg total) by mouth 3 (three) times daily. 90 capsule 2   spironolactone (ALDACTONE) 25 MG tablet Take 1/2 tablet (12.5 mg total) by mouth daily. 15 tablet 2   warfarin (COUMADIN) 5 MG tablet Take 1 tablet (5 mg total) by mouth daily. 30 tablet 2   No current facility-administered medications on file prior to visit.    No Known Allergies  Social History   Socioeconomic History   Marital status: Married    Spouse name: Not on file   Number of children: Not on file   Years of education: Not on file   Highest education level: Not on file  Occupational History   Occupation: unemployed  Tobacco Use   Smoking status: Never   Smokeless tobacco: Never  Vaping Use   Vaping Use: Not on file  Substance and Sexual Activity   Alcohol use: Not Currently    Comment: "occasional" when "hanging out with the wrong people" No recent use.   Drug use: Not Currently    Types: Marijuana    Comment: occasional, last 2013   Sexual activity: Not Currently  Other Topics Concern   Not on file  Social History Narrative   Pt lives alone. Has no information on father.   Social Determinants of Health   Financial Resource Strain: High Risk   Difficulty of Paying Living Expenses: Hard  Food Insecurity: No Food Insecurity   Worried About Charity fundraiser in the Last Year: Never true   Arboriculturist in the Last Year: Never true  Transportation Needs: Public librarian (Medical): Yes   Lack of Transportation (Non-Medical): Yes  Physical Activity: Not on file  Stress: Not on file  Social Connections: Not on file  Intimate Partner Violence: Not on file    Family History  Problem Relation Age of Onset   Stroke Mother    Diabetes Mother    Hypertension Mother    Stroke Maternal Aunt    Heart attack Neg Hx     Past Surgical History:  Procedure Laterality Date   None       ROS: Review of Systems Negative except as stated above  PHYSICAL EXAM: BP 123/81   Pulse 96   Resp 16   Wt 159 lb 12.8 oz (72.5 kg)   SpO2 100%  BMI 22.29 kg/m   Wt Readings from Last 3 Encounters:  10/27/20 159 lb 12.8 oz (72.5 kg)  10/17/20 158 lb (71.7 kg)  10/09/20 157 lb (71.2 kg)    Physical Exam  General appearance - alert, well appearing, and in no distress Mental status - normal mood, behavior, speech, dress, motor activity, and thought processes Neck - supple, no significant adenopathy Chest - clear to auscultation, no wheezes, rales or rhonchi, symmetric air entry Heart - normal rate, regular rhythm, normal S1, S2, no murmurs, rubs, clicks or gallops Neurological - cranial nerves II through XII intact.  He walks unassisted.  Gait is slowed but but stable.  Grip 4+/5 bilaterally.  Power in the upper extremities 4+/5 bilaterally.  Power in the lower extremities 5/5 bilaterally. Decreased sensation on the plantar surface of both feet on leap exam.  No muscle wasting noted in the hands. Extremities - peripheral pulses normal, no pedal edema, no clubbing or cyanosis   CMP Latest Ref Rng & Units 10/09/2020 09/22/2020 09/21/2020  Glucose 70 - 99 mg/dL 221(H) 184(H) 99  BUN 6 - 20 mg/dL '13 14 18  ' Creatinine 0.61 - 1.24 mg/dL 0.89 0.92 0.95  Sodium 135 - 145 mmol/L 139 138 137  Potassium 3.5 - 5.1 mmol/L 4.3 4.5 4.1  Chloride 98 - 111 mmol/L 103 103 100  CO2 22 - 32 mmol/L '30 26 26  ' Calcium 8.9 - 10.3 mg/dL 8.9 9.1 8.4(L)  Total Protein 6.5 - 8.1 g/dL - 6.5 -  Total Bilirubin 0.3 - 1.2 mg/dL - 0.5 -  Alkaline Phos 38 - 126 U/L - 78 -  AST 15 - 41 U/L - 36 -  ALT 0 - 44 U/L - 42 -   Lipid Panel     Component Value Date/Time   CHOL 181 10/24/2020 1115   TRIG 113 10/24/2020 1115   HDL 67 10/24/2020 1115   CHOLHDL 2.7 10/24/2020 1115   CHOLHDL 8.4 09/12/2020 0201   VLDL 30 09/12/2020 0201   LDLCALC 94 10/24/2020 1115    CBC    Component Value Date/Time    WBC 3.9 (L) 10/09/2020 1433   RBC 4.54 10/09/2020 1433   HGB 13.2 10/09/2020 1433   HCT 40.8 10/09/2020 1433   PLT 252 10/09/2020 1433   MCV 89.9 10/09/2020 1433   MCH 29.1 10/09/2020 1433   MCHC 32.4 10/09/2020 1433   RDW 12.9 10/09/2020 1433   LYMPHSABS 2.6 11/14/2009 2120   MONOABS 0.4 11/14/2009 2120   EOSABS 0.1 11/14/2009 2120   BASOSABS 0.0 11/14/2009 2120    ASSESSMENT AND PLAN: 1. Type 2 diabetes mellitus with diabetic polyneuropathy, with long-term current use of insulin ([place]) -Patient given a log sheet today.  Advised to check his blood sugars 3 times a day before meals and write down the numbers.  Follow-up with clinical pharmacist in 2 weeks with his log sheet for review and further tweaking of medications.  Stop glipizide.  Continue metformin and Novolin insulin 70/30 22 units in the morning and 18 units in the evening. -Discussed management of hypoglycemic episodes. -He is having some bothersome neuropathy symptoms.  He is willing to try low-dose of Lyrica. - POCT glucose (manual entry) - pregabalin (LYRICA) 25 MG capsule; Take 1 capsule (25 mg total) by mouth 2 (two) times daily.  Dispense: 60 capsule; Refill: 3  2. History of cerebrovascular accident (CVA) with residual deficit -Discussed the importance of good diabetes and blood pressure control. He has appointment with neurologist Dr.  Penumalli in September.  However I will send him an inbox message inquiring whether patient needs to stay on aspirin given that he is on Coumadin.  Continue statin therapy. -Once he is approved for the orange card, we can refer him for some more physical therapy.  3. Hypertension associated with diabetes ([place]) Controlled on current medications.  4. Non-ischemic cardiomyopathy ([place]) Table on guideline directed therapy and followed by cardiology.  5. Left ventricular thrombus On Coumadin.  Levels checked through cardiology clinic.  Advised patient to avoid falls as he can have  significant bleeding.  6. Seizure disorder [facility]) Keep follow-up appointment with neurology in September for guidance on whether he needs to continue all 3 antiseizure medications.  7. Erectile dysfunction, unspecified erectile dysfunction type Discussed trying him with Viagra.  However I will send a message to the cardiology nurse practitioner first to make sure that it is okay.  Sublingual nitroglycerin on the list but patient states he has not had to use that in years.  I told patient that it would be contraindicated for him to take Viagra if he has taken sublingual nitroglycerin as it can cause significantly low blood pressure.  I accidentally sent a refill on it to the pharmacy but will have the pharmacy cancel that prescription. In the meantime I went over how Viagra works, how to take the medication and possible side effects. Pt advised of possible side effects of this medication including prolong erection, flushing, headaches, stuff nose and sudden vision and hearing changes.  Pt told to be seen in ER if he has erection lasting longer than 3-4 hrs, or if he has sudden vision changes or hearing loss.     Patient was given the opportunity to ask questions.  Patient verbalized understanding of the plan and was able to repeat key elements of the plan.   Orders Placed This Encounter  Procedures   POCT glucose (manual entry)     Requested Prescriptions    No prescriptions requested or ordered in this encounter    No follow-ups on file.  Karle Plumber, MD, FACP

## 2020-10-27 NOTE — Patient Instructions (Addendum)
Please keep a log of your blood sugar readings and bring them with you in 2 weeks to see our clinical pharmacist. Stop glipizide.  I will touch base with the cardiologist to make sure it is okay for [place] to prescribe Viagra for you. Possible side effects of this medication including prolong erection, flushing, headaches, stuff nose and sudden vision and hearing changes.  Be seen in ER if you have an erection lasting longer than 3-4 hours, or if you has sudden vision changes or hearing loss.  Try to get your diabetic eye exam done as soon as you are able.  I have prescribed a medication called Lyrica to help with her diabetic neuropathy symptoms.

## 2020-10-30 ENCOUNTER — Encounter (HOSPITAL_COMMUNITY): Payer: Self-pay

## 2020-10-30 ENCOUNTER — Other Ambulatory Visit (HOSPITAL_COMMUNITY): Payer: Self-pay

## 2020-10-30 ENCOUNTER — Other Ambulatory Visit: Payer: Self-pay

## 2020-10-30 ENCOUNTER — Observation Stay (HOSPITAL_COMMUNITY): Admission: RE | Admit: 2020-10-30 | Payer: Medicaid Other | Source: Ambulatory Visit | Admitting: Cardiology

## 2020-10-30 ENCOUNTER — Ambulatory Visit (HOSPITAL_COMMUNITY)
Admission: RE | Admit: 2020-10-30 | Discharge: 2020-10-30 | Disposition: A | Discharge status: Home / Self Care | Payer: Medicaid Other | Source: Ambulatory Visit | Attending: Family Medicine | Admitting: Family Medicine

## 2020-10-30 ENCOUNTER — Telehealth: Payer: Self-pay | Admitting: Physician Assistant

## 2020-10-30 VITALS — BP 110/62 | HR 85 | Wt 157.0 lb

## 2020-10-30 DIAGNOSIS — I639 Cerebral infarction, unspecified: Secondary | ICD-10-CM | POA: Diagnosis not present

## 2020-10-30 DIAGNOSIS — R079 Chest pain, unspecified: Secondary | ICD-10-CM

## 2020-10-30 DIAGNOSIS — I428 Other cardiomyopathies: Secondary | ICD-10-CM | POA: Insufficient documentation

## 2020-10-30 DIAGNOSIS — I110 Hypertensive heart disease with heart failure: Secondary | ICD-10-CM | POA: Insufficient documentation

## 2020-10-30 DIAGNOSIS — E1165 Type 2 diabetes mellitus with hyperglycemia: Secondary | ICD-10-CM | POA: Insufficient documentation

## 2020-10-30 DIAGNOSIS — Z833 Family history of diabetes mellitus: Secondary | ICD-10-CM | POA: Insufficient documentation

## 2020-10-30 DIAGNOSIS — E1169 Type 2 diabetes mellitus with other specified complication: Secondary | ICD-10-CM | POA: Diagnosis not present

## 2020-10-30 DIAGNOSIS — Z8249 Family history of ischemic heart disease and other diseases of the circulatory system: Secondary | ICD-10-CM | POA: Insufficient documentation

## 2020-10-30 DIAGNOSIS — Z79899 Other long term (current) drug therapy: Secondary | ICD-10-CM | POA: Diagnosis not present

## 2020-10-30 DIAGNOSIS — Z7901 Long term (current) use of anticoagulants: Secondary | ICD-10-CM | POA: Diagnosis not present

## 2020-10-30 DIAGNOSIS — I200 Unstable angina: Secondary | ICD-10-CM | POA: Diagnosis not present

## 2020-10-30 DIAGNOSIS — Z794 Long term (current) use of insulin: Secondary | ICD-10-CM | POA: Diagnosis not present

## 2020-10-30 DIAGNOSIS — R569 Unspecified convulsions: Secondary | ICD-10-CM

## 2020-10-30 DIAGNOSIS — E785 Hyperlipidemia, unspecified: Secondary | ICD-10-CM | POA: Diagnosis not present

## 2020-10-30 DIAGNOSIS — I5022 Chronic systolic (congestive) heart failure: Secondary | ICD-10-CM

## 2020-10-30 DIAGNOSIS — I513 Intracardiac thrombosis, not elsewhere classified: Secondary | ICD-10-CM | POA: Diagnosis not present

## 2020-10-30 DIAGNOSIS — Z7982 Long term (current) use of aspirin: Secondary | ICD-10-CM | POA: Diagnosis not present

## 2020-10-30 DIAGNOSIS — R471 Dysarthria and anarthria: Secondary | ICD-10-CM | POA: Insufficient documentation

## 2020-10-30 DIAGNOSIS — R202 Paresthesia of skin: Secondary | ICD-10-CM | POA: Insufficient documentation

## 2020-10-30 LAB — TROPONIN I (HIGH SENSITIVITY): Troponin I (High Sensitivity): 21 ng/L — ABNORMAL HIGH (ref <18)

## 2020-10-30 LAB — CBC
HCT: 38.8 % — ABNORMAL LOW (ref 39.0–52.0)
Hemoglobin: 12.9 g/dL — ABNORMAL LOW (ref 13.0–17.0)
MCH: 29.5 pg (ref 26.0–34.0)
MCHC: 33.2 g/dL (ref 30.0–36.0)
MCV: 88.8 fL (ref 80.0–100.0)
Platelets: 220 10*3/uL (ref 150–400)
RBC: 4.37 MIL/uL (ref 4.22–5.81)
RDW: 12.5 % (ref 11.5–15.5)
WBC: 4.1 10*3/uL (ref 4.0–10.5)
nRBC: 0 % (ref 0.0–0.2)

## 2020-10-30 LAB — BRAIN NATRIURETIC PEPTIDE: B Natriuretic Peptide: 112.2 pg/mL — ABNORMAL HIGH (ref 0.0–100.0)

## 2020-10-30 LAB — BASIC METABOLIC PANEL
Anion gap: 8 (ref 5–15)
BUN: 13 mg/dL (ref 6–20)
CO2: 29 mmol/L (ref 22–32)
Calcium: 8.9 mg/dL (ref 8.9–10.3)
Chloride: 103 mmol/L (ref 98–111)
Creatinine, Ser: 0.84 mg/dL (ref 0.61–1.24)
GFR, Estimated: >60 mL/min (ref >60)
Glucose, Bld: 102 mg/dL — ABNORMAL HIGH (ref 70–99)
Potassium: 3.8 mmol/L (ref 3.5–5.1)
Sodium: 140 mmol/L (ref 135–145)

## 2020-10-30 LAB — DIGOXIN LEVEL: Digoxin Level: 0.7 ng/mL — ABNORMAL LOW (ref 0.8–2.0)

## 2020-10-30 LAB — PROTIME-INR
INR: 2.6 — ABNORMAL HIGH (ref 0.8–1.2)
Prothrombin Time: 27.5 seconds — ABNORMAL HIGH (ref 11.4–15.2)

## 2020-10-30 LAB — D-DIMER, QUANTITATIVE: D-Dimer, Quant: 0.36 ug/mL-FEU (ref 0.00–0.50)

## 2020-10-30 MED ORDER — ENTRESTO 24-26 MG PO TABS
1.0000 | ORAL_TABLET | Freq: Two times a day (BID) | ORAL | 11 refills | Status: DC
  Filled 2020-10-30: qty 60, 30d supply, fill #0
  Filled 2020-11-20: qty 60, 30d supply, fill #1

## 2020-10-30 NOTE — Addendum Note (Signed)
Encounter addended by: Faythe Casa, CMA on: 10/30/2020 3:25 PM  Actions taken: Vitals modified

## 2020-10-30 NOTE — Progress Notes (Addendum)
PCP: Dr. Karle Plumber Primary Cardiologist: Dr. Aundra Dubin  HPI: 35 yo male with diabetes, HTN, and a long history of nonischemic cardiomyopathy. He had a cardiac MRI in 1/08 showing low EF, but he says that he had been told about "heart problems" even prior to that.  He does not have a family history of cardiomyopathy that he knows of, but does not know his father's family.  Coronary CT angiogram in 2014 showed no CAD. Echo in 2/17 showed EF 20-25%, similar to 2014.  He has generally had sporadic followup over the last few years, but has been seeing Dr Johnsie Cancel recently. He was seen in the [facility] in 2017 but no f/u since. Last f/u w/ Cardiology was in 2020. Had repeat echo in 2020 showing LVEF 15-20%. RV normal. Most recently, he was diagnosed w/ COVID in April.  He presented to [facility] ED 09/11/20 with tingling in left arm, left face, drooling and blank stare for about 1 week, and admitted for possible seizure, HHS and possible CVA.  In ED, he had left-sided neurologic deficit with dysarthria.  CT head with probable subacute infarct in right frontal lobe and old right parietal infarct.  Reportedly, patient's neurologic deficits resolved in ED, and focus shifted to possible seizure versus stroke.  He was loaded with Keppra.   MRI brain with probable hemorrhagic infarct involving the anterior right frontal lobe and left parietal occipital cortex with additional chronic right parietal infarct, right frontotemporal region enhancement suggesting changes related to acute seizure, possible toxic insult involving the caudate and lentiform nuclei bilaterally.  Neurology recommended interval follow-up brain MRI.  CTA head and neck without significant finding.   TTE with EF of 10-15%, LV thrombus, moderate RV dysfunction and dilated IVC. He was discharged on GDMT, but A1c too high for SGLT2i.    Seen in [facility] ED 09/21/20 for fall after syncopal episode on toilet, hitting his head. CT of the head was significant for no bleeding  but new low-density left cerebellum suggesting acute infarct. EEG unremarkable and felt fall secondary to vagal episode not seizure.  He returned 6/22 for post hospital HF follow up. Overall feeling fine but is not exerting himself physically. Denies increasing SOB, CP, dizziness, edema, or PND/Orthopnea. Appetite ok. No fever or chills. Weight at home 150-152 pounds. Taking all medications. Counting carbs and checking blood sugars TID.  Today he returns for HF follow up. Overall feeling poorly. For the past 1-2 weeks he has had spontaneously occurring chest pain, mid sternal, that spontaneously resolves. No radiation or associated diaphoresis. Unable to quantify length of episodes. Some tingling in left arm when he reaches across his body. Worsening dyspnea with lifting/pushing. Overall feeling sluggish. Denies increasing dizziness, edema, or PND/Orthopnea. Appetite ok. No fever or chills. Weight at home 157 pounds. Taking all medications. No ETOH or drugs. Says his blood sugars have been stable.  ROS: All systems negative except as listed in HPI, PMH and Problem List.  SH:  Social History   Socioeconomic History   Marital status: Married    Spouse name: Not on file   Number of children: Not on file   Years of education: Not on file   Highest education level: Not on file  Occupational History   Occupation: unemployed  Tobacco Use   Smoking status: Never   Smokeless tobacco: Never  Vaping Use   Vaping Use: Not on file  Substance and Sexual Activity   Alcohol use: Not Currently    Comment: "occasional" when "hanging  out with the wrong people" No recent use.   Drug use: Not Currently    Types: Marijuana    Comment: occasional, last 2013   Sexual activity: Not Currently  Other Topics Concern   Not on file  Social History Narrative   Pt lives alone. Has no information on father.   Social Determinants of Health   Financial Resource Strain: High Risk   Difficulty of Paying Living  Expenses: Hard  Food Insecurity: No Food Insecurity   Worried About Charity fundraiser in the Last Year: Never true   Arboriculturist in the Last Year: Never true  Transportation Needs: Public librarian (Medical): Yes   Lack of Transportation (Non-Medical): Yes  Physical Activity: Not on file  Stress: Not on file  Social Connections: Not on file  Intimate Partner Violence: Not on file   FH:  Family History  Problem Relation Age of Onset   Stroke Mother    Diabetes Mother    Hypertension Mother    Stroke Maternal Aunt    Heart attack Neg Hx    Past Medical History:  Diagnosis Date   Chronic systolic CHF (congestive heart failure) ([place]) 06/18/2015   Diabetes mellitus without complication ([place])    Hemorrhagic stroke ([place])    Hyperlipidemia    Hypertension    Nonischemic cardiomyopathy ([place]) Noted as early as 2007   Per chart review (cards consult note 2011), EF of 40% in 2007, down to 20-25% in 2011   Current Outpatient Medications  Medication Sig Dispense Refill   acetaminophen (TYLENOL) 500 MG tablet Take 1,500 mg by mouth every 6 (six) hours as needed for moderate pain or headache.     aspirin 81 MG EC tablet Take 1 tablet (81 mg total) by mouth daily. Swallow whole. 30 tablet 2   atorvastatin (LIPITOR) 80 MG tablet Take 1 tablet (80 mg total) by mouth daily. 30 tablet 2   Blood Glucose Monitoring Suppl (TRUE METRIX METER) w/Device KIT 1 each by Does not apply route 3 (three) times daily. 1 kit 0   Blood Pressure Monitor DEVI Use as directed to check home blood pressure 2-3 times a week 1 Device 0   digoxin (LANOXIN) 0.125 MG tablet Take 1 tablet (0.125 mg total) by mouth daily. 30 tablet 2   ezetimibe (ZETIA) 10 MG tablet Take 1 tablet (10 mg total) by mouth daily. 30 tablet 2   furosemide (LASIX) 20 MG tablet Take 1 tablet (20 mg total) by mouth daily. 30 tablet 2   glucose blood test strip Use as instructed 100 each 12   insulin  isophane & regular human (NOVOLIN 70/30 FLEXPEN) (70-30) 100 UNIT/ML KwikPen 22 units SQ Q a.m and 18 units Q p.m 13.2 mL 5   Insulin Pen Needle (PEN NEEDLES) 31G X 8 MM MISC UAD 100 each 6   Insulin Pen Needle 32G X 4 MM MISC Use in the morning, at noon, and at bedtime. 100 each 0   Insulin Syringe-Needle U-100 (RELION INSULIN SYRINGE) 31G X 15/64" 0.3 ML MISC Use to inject insulin daily. 100 each 11   ivabradine (CORLANOR) 7.5 MG TABS tablet Take 1 tablet (7.5 mg total) by mouth 2 (two) times daily with a meal. 60 tablet    levETIRAcetam (KEPPRA) 750 MG tablet Take 2 tablets (1,500 mg total) by mouth 2 (two) times daily. 120 tablet 2   losartan (COZAAR) 25 MG tablet Take 1/2 tablet (12.5 mg  total) by mouth daily. 15 tablet 2   metFORMIN (GLUCOPHAGE) 500 MG tablet Take 1 tablet (500 mg total) by mouth 2 (two) times daily with a meal. 60 tablet 4   metoprolol (TOPROL-XL) 200 MG 24 hr tablet Take 1 tablet (200 mg total) by mouth daily. Take with or immediately following a meal. 30 tablet 2   Oxcarbazepine (TRILEPTAL) 300 MG tablet Take 2 tablets (600 mg total) by mouth 2 (two) times daily. 120 tablet 2   phenytoin (DILANTIN) 100 MG ER capsule Take 1 capsule (100 mg total) by mouth 3 (three) times daily. 90 capsule 2   pregabalin (LYRICA) 25 MG capsule Take 1 capsule (25 mg total) by mouth 2 (two) times daily. 60 capsule 3   sildenafil (VIAGRA) 50 MG tablet TAKE 1 TABLET BY MOUTH 1/2-1 HOUR PRIOR TO SEX AS NEEDED. MAX 1 TABLET PER 24 HOURS 10 tablet 4   spironolactone (ALDACTONE) 25 MG tablet Take 1/2 tablet (12.5 mg total) by mouth daily. 15 tablet 2   warfarin (COUMADIN) 5 MG tablet Take 1 tablet (5 mg total) by mouth daily. 30 tablet 2   No current facility-administered medications for this encounter.   BP 110/77   Pulse 87   Wt 71.2 kg (157 lb)   SpO2 97%   BMI 21.90 kg/m   Wt Readings from Last 3 Encounters:  10/30/20 71.2 kg (157 lb)  10/27/20 72.5 kg (159 lb 12.8 oz)  10/17/20 71.7  kg (158 lb)   PHYSICAL EXAM: General:  NAD. No resp difficulty, thin. HEENT: Normal Neck: Supple. No JVD. Carotids 2+ bilat; no bruits. No lymphadenopathy or thryomegaly appreciated. Cor: PMI nondisplaced. Regular rate & rhythm. No rubs, gallops or murmurs. Lungs: Clear Abdomen: Soft, nontender, nondistended. No hepatosplenomegaly. No bruits or masses. Good bowel sounds. Extremities: No cyanosis, clubbing, rash, edema Neuro: Alert & oriented x 3, cranial nerves grossly intact. Moves all 4 extremities w/o difficulty. Affect pleasant.  ECG: SR 87 bpm, iRBBB (personally reviewed). ReDs Clip: 39%  ASSESSMENT & PLAN:  1. Acute chest pain: Atypical CP x 1-2 weeks. Unlikely PE as he is on Coumadin. Coronary CT angiogram in 2014 showed no CAD. - He has worrisome risk factors -->uncontrolled DM (a1c 15.5) and HLD (LDL better now on atorva + Zetia). - Personally discussed with Dr. Aundra Dubin, at this point think best course will be R/LHC to evaluate heart pressures and cors. 2. Chronic systolic CHF: Nonischemic cardiomyopathy known for years.  Normal coronary CTA in 2014.  Echo this admission with EF 10-15%, LV thrombus, moderate RV dysfunction, dilated IVC. With markedly high LDL and poorly controlled glucose, concern for development of CAD.Marland Kitchen  On exam, he does not look volume overloaded but ReDs mildly elevated at 39%. - NYHA II.  - Stop losartan. Start Entresto 24/26/mg bid. BMET today. - Continue Toprol XL 200 mg daily. - Continue Ivabradine 7.5 mg bid. HR 87. - Continue digoxin 0.125. Check dig level today. - Continue spironolactone 12.5 daily. - Continue Lasix 20 mg daily.  - Continue Losartan 12.5 daily. - Hgb A1c too high for SGLT2i (>15.5). - Continue with Paramedicine. 3. LV thrombus: With associated CVA. - On Coumadin. INR today. Has been subtherapeutic. 4. 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  Still with some dysarthria. - Anticoagulation  ok per neurology. - Atorvastatin. 5. 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  Now on Keppra. 6.  Hyperlipidemia: Markedly high LDL.   - Continue atorvastatin 80 mg daily +  Zetia. - F/u LP 6-8 weeks. Lipid Clinic referral if LDL not at goal after improved compliance. 7.  Diabetes: Poor control. Hgb A1c >15.5. - On Novolog 70/30, glipizide, and metformin. - Not candidate for SGLT2i.    Discussed with Dr. Aundra Dubin. Will admit patient to tele, start on heparin gtt and complete R/LHC when INR<2.0  [facility], FNP-BC 10/30/20  Addendum: Unable to secure inpatient bed for patient at this time. Discussed with Dr. Aundra Dubin, patient's HsTroponin negative. We will send him home and have Coumadin clinic bridge with Lovenox. He will hold his Entresto day before cath. INR and BMET morning of cath. R/LHC scheduled for 11/02/20 @ 7:20 with Dr. Aundra Dubin.  Patient seen with NP, agree with the above note .  He comes in for "sick visit" today because of chest pain.  He has had episodes for the last 2 wks, worse over the last week.  Pain is on and off, not continuous.  He cannot identify a trigger.  It is an aching in his chest, episodes this week have been worse.  It is not pleuritic and not clearly positional.  It is not clearly exertional.  He has generalized fatigue and worsened dyspnea with moderate exertion.     REDS clip 39%.   ECG reviewed, shows nonspecific lateral TWIs similar to prior.    General: NAD Neck: No JVD, no thyromegaly or thyroid nodule.  Lungs: Clear to auscultation bilaterally with normal respiratory effort. CV: Nondisplaced PMI.  Heart regular S1/S2, no S3/S4, no murmur.  No peripheral edema.   Abdomen: Soft, nontender, no hepatosplenomegaly, no distention.  Skin: Intact without lesions or rashes.  Neurologic: Alert and oriented x 3.  Psych: Normal affect. Extremities: No clubbing or cyanosis.  HEENT: Normal.   1. Chest pain: Patient has had episodes of atypical chest pain, worsening  over the last 2 wks.  ECG with nonspecific abnormalities, not significantly changed.  He had a remote CTA (2014) with no significant CAD. With markedly high LDL until statin recently started and poorly controlled glucose, concern for development of CAD.  Could coronary disease be contributing to his cardiomyopathy?  Cath was deferred last admission due to recent CVA (09/11/20).  He is now > 6 wks out from CVA.   - Cycle troponin.  - Will admit for observation, will plan left/right heart cath in am as long as INR < 2.  Discussed risks and benefits with patient, he agrees to procedure.  Will hold warfarin for now, start heparin gtt when INR < 2 (will need bridging anticoagulation given LV thrombus history).  2. Chronic systolic CHF: Cardiomyopathy known for years.  Normal coronary CTA remotely in 2014.  Echo 5/22 with EF 10-15%, LV thrombus, moderate RV dysfunction, dilated IVC. With markedly high LDL and poorly controlled glucose, concern for development of CAD.  On exam, he does not look volume overloaded though REDS clip is mildly elevated at 39%.  Creatinine and BP stable.  NYHA class III symptoms.  Significant fatigue, could he have low output HF?  - Continue Toprol XL 200 mg daily - Continue Ivabradine to  7.5 mg bid - Continue digoxin 0.125, level ok today at 0.7.  - Continue Spironolactone 12.5 daily. - Can continue Lasix 20 mg daily, but will stop losartan and start Entresto 24/26 bid.  This should give a bit extra diuresis.  - Hgb A1c has been too high for SGLT2i (>15.5 at prior check)  - Plan right/left cath given chest pain and risk factors  as well as some degree of volume overload and concern for low output as above. 2. LV thrombus: With associated CVA 09/11/20. - Holding warfarin for cath but will need bridging anticoagulation.  3. 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  Still with some mild dysarthria. - Continue atorvastatin.  4. Seizures: Likely related  to CVA.  Now on Keppra. 5.  Hyperlipidemia: Markedly high LDL (305 in 5/22, but now down to 94 in 7/22).   - Continue atorvastatin 80 mg and Zetia 10 mg daily.  - Goal LDL < 70, will refer to lipid clinic for Repatha.  6.  Diabetes: Poor control. Hgb A1c >15.5 - Home meds + SSI.   Loralie Champagne 10/30/2020 1:42 PM  HS-TnI not significantly elevated at 21.  There is no bed available in the hospital.   INR is 2.6.  We will hold warfarin and cover with Lovenox when INR < 2.  We have arranged for LHC/RHC on Thursday at 7:30 am.  We are going to let Mr Titzer go home and return for cath on Thursday.  If symptoms worsen prior to then, he will return to the ER.   Loralie Champagne 10/30/2020 5:11 PM

## 2020-10-30 NOTE — Addendum Note (Signed)
Encounter addended by: Jacklynn Ganong, FNP on: 10/30/2020 4:30 PM  Actions taken: Clinical Note Signed

## 2020-10-30 NOTE — Progress Notes (Signed)
ReDS Vest / Clip - 10/30/20 1200       ReDS Vest / Clip   Station Marker C    Ruler Value 31    ReDS Value Range Moderate volume overload    ReDS Actual Value 39

## 2020-10-30 NOTE — Addendum Note (Signed)
Encounter addended by: Faythe Casa, CMA on: 10/30/2020 4:27 PM  Actions taken: Vitals modified

## 2020-10-30 NOTE — Progress Notes (Signed)
Paramedicine Encounter    Patient ID: Jason Stevenson, male    DOB: 07-15-1985, 35 y.o.   MRN: [id number]   Patient Care Team: Ladell Pier, MD as PCP - General (Internal Medicine) Josue Hector, MD as PCP - Cardiology (Cardiology)  Patient Active Problem List   Diagnosis Date Noted   Chest pain 10/30/2020   History of cerebrovascular accident (CVA) with residual deficit 10/27/2020   Protein-calorie malnutrition, severe 09/22/2020   Acute CVA (cerebrovascular accident) ([place]) 09/21/2020   Near syncope 09/21/2020   Encounter for monitoring Coumadin therapy 09/20/2020   LV (left ventricular) mural thrombus 09/13/2020   Seizure ([place]) 09/13/2020   Neurological deficit present 09/12/2020   History of COVID-19 09/12/2020   Intracerebral hemorrhage 09/12/2020   Acute cerebrovascular accident (CVA) ([place]) 09/12/2020   Hyperosmolar hyperglycemic state (HHS) ([place]) 09/11/2020   COVID-19 08/23/2020   Noncompliance with medication treatment due to intermittent use of medication 10/12/2018   Dyslipidemia 04/17/2016   Uncontrolled type 2 diabetes mellitus without complication, with long-term current use of insulin 64/33/2951   Chronic systolic CHF (congestive heart failure) ([place]) 06/18/2015   Needs flu shot 12/23/2013   Type 2 diabetes mellitus ([place]) 11/18/2013   Diabetes ([place]) 06/17/2013   Non-ischemic cardiomyopathy ([place]) 11/06/2012   HTN (hypertension) 11/06/2012   HLD (hyperlipidemia) 11/06/2012    Current Outpatient Medications:    acetaminophen (TYLENOL) 500 MG tablet, Take 1,500 mg by mouth every 6 (six) hours as needed for moderate pain or headache., Disp: , Rfl:    aspirin 81 MG EC tablet, Take 1 tablet (81 mg total) by mouth daily. Swallow whole., Disp: 30 tablet, Rfl: 2   atorvastatin (LIPITOR) 80 MG tablet, Take 1 tablet (80 mg total) by mouth daily., Disp: 30 tablet, Rfl: 2   Blood Glucose Monitoring Suppl (TRUE METRIX METER) w/Device KIT, 1 each by Does not apply route 3  (three) times daily., Disp: 1 kit, Rfl: 0   Blood Pressure Monitor DEVI, Use as directed to check home blood pressure 2-3 times a week, Disp: 1 Device, Rfl: 0   digoxin (LANOXIN) 0.125 MG tablet, Take 1 tablet (0.125 mg total) by mouth daily., Disp: 30 tablet, Rfl: 2   ezetimibe (ZETIA) 10 MG tablet, Take 1 tablet (10 mg total) by mouth daily., Disp: 30 tablet, Rfl: 2   furosemide (LASIX) 20 MG tablet, Take 1 tablet (20 mg total) by mouth daily., Disp: 30 tablet, Rfl: 2   glucose blood test strip, Use as instructed, Disp: 100 each, Rfl: 12   insulin isophane & regular human (NOVOLIN 70/30 FLEXPEN) (70-30) 100 UNIT/ML KwikPen, 22 units SQ Q a.m and 18 units Q p.m, Disp: 13.2 mL, Rfl: 5   Insulin Pen Needle (PEN NEEDLES) 31G X 8 MM MISC, UAD, Disp: 100 each, Rfl: 6   Insulin Pen Needle 32G X 4 MM MISC, Use in the morning, at noon, and at bedtime., Disp: 100 each, Rfl: 0   Insulin Syringe-Needle U-100 (RELION INSULIN SYRINGE) 31G X 15/64" 0.3 ML MISC, Use to inject insulin daily., Disp: 100 each, Rfl: 11   ivabradine (CORLANOR) 7.5 MG TABS tablet, Take 1 tablet (7.5 mg total) by mouth 2 (two) times daily with a meal., Disp: 60 tablet, Rfl:    levETIRAcetam (KEPPRA) 750 MG tablet, Take 2 tablets (1,500 mg total) by mouth 2 (two) times daily., Disp: 120 tablet, Rfl: 2   metFORMIN (GLUCOPHAGE) 500 MG tablet, Take 1 tablet (500 mg total) by mouth 2 (two) times daily with a  meal., Disp: 60 tablet, Rfl: 4   metoprolol (TOPROL-XL) 200 MG 24 hr tablet, Take 1 tablet (200 mg total) by mouth daily. Take with or immediately following a meal., Disp: 30 tablet, Rfl: 2   Oxcarbazepine (TRILEPTAL) 300 MG tablet, Take 2 tablets (600 mg total) by mouth 2 (two) times daily., Disp: 120 tablet, Rfl: 2   phenytoin (DILANTIN) 100 MG ER capsule, Take 1 capsule (100 mg total) by mouth 3 (three) times daily., Disp: 90 capsule, Rfl: 2   pregabalin (LYRICA) 25 MG capsule, Take 1 capsule (25 mg total) by mouth 2 (two) times  daily., Disp: 60 capsule, Rfl: 3   sacubitril-valsartan (ENTRESTO) 24-26 MG, Take 1 tablet by mouth 2 (two) times daily., Disp: 60 tablet, Rfl: 11   sildenafil (VIAGRA) 50 MG tablet, TAKE 1 TABLET BY MOUTH 1/2-1 HOUR PRIOR TO SEX AS NEEDED. MAX 1 TABLET PER 24 HOURS, Disp: 10 tablet, Rfl: 4   spironolactone (ALDACTONE) 25 MG tablet, Take 1/2 tablet (12.5 mg total) by mouth daily., Disp: 15 tablet, Rfl: 2 No Known Allergies   Social History   Socioeconomic History   Marital status: Married    Spouse name: Not on file   Number of children: Not on file   Years of education: Not on file   Highest education level: Not on file  Occupational History   Occupation: unemployed  Tobacco Use   Smoking status: Never   Smokeless tobacco: Never  Vaping Use   Vaping Use: Not on file  Substance and Sexual Activity   Alcohol use: Not Currently    Comment: "occasional" when "hanging out with the wrong people" No recent use.   Drug use: Not Currently    Types: Marijuana    Comment: occasional, last 2013   Sexual activity: Not Currently  Other Topics Concern   Not on file  Social History Narrative   Pt lives alone. Has no information on father.   Social Determinants of Health   Financial Resource Strain: High Risk   Difficulty of Paying Living Expenses: Hard  Food Insecurity: No Food Insecurity   Worried About Charity fundraiser in the Last Year: Never true   Ran Out of Food in the Last Year: Never true  Transportation Needs: Unmet Transportation Needs   Lack of Transportation (Medical): Yes   Lack of Transportation (Non-Medical): Yes  Physical Activity: Not on file  Stress: Not on file  Social Connections: Not on file  Intimate Partner Violence: Not on file    Physical Exam Vitals reviewed.  Constitutional:      Appearance: He is normal weight.  HENT:     Head: Normocephalic.     Nose: Nose normal.     Mouth/Throat:     Mouth: Mucous membranes are moist.     Pharynx:  Oropharynx is clear.  Eyes:     Conjunctiva/sclera: Conjunctivae normal.     Pupils: Pupils are equal, round, and reactive to light.  Cardiovascular:     Rate and Rhythm: Normal rate and regular rhythm.     Pulses: Normal pulses.     Heart sounds: Normal heart sounds.  Pulmonary:     Effort: Pulmonary effort is normal.     Breath sounds: Normal breath sounds.  Abdominal:     General: Abdomen is flat.     Palpations: Abdomen is soft.  Musculoskeletal:        General: No swelling. Normal range of motion.     Cervical back: Normal range  of motion.     Right lower leg: No edema.     Left lower leg: No edema.  Skin:    General: Skin is warm and dry.     Capillary Refill: Capillary refill takes less than 2 seconds.  Neurological:     General: No focal deficit present.     Mental Status: He is alert. Mental status is at baseline.  Psychiatric:        Mood and Affect: Mood normal.     Arrived for home visit for [facility]. He reports feeling tired and having intermittent chest pain.   Vitals as noted. EKG obtained and texted to triage at HF clinic for review.   [name] NP requested patient to come into clinic. I arranged transportation for Jason Stevenson to go to clinic for a 12:00 appointment.   I reviewed medications and filled pill box accordingly. Kolbe has not started Lyrica or Viagra as of yet.   [facility] transport arrived for pick up, I met Jason Stevenson at clinic where he talked with [name] NP and Dr. Aundra Dubin and they agreed upon admission with heart cath scheduled for Thursday. I will continue to follow up with patient once discharged.   Refills: Corlanor (sampled obtained in clinic)  CBG- 121   REDS CLIP 39%   Future Appointments  Date Time Provider [place]  10/31/2020  9:45 AM [facility]  11/09/2020  1:00 PM [facility] PHARMACY [facility] None  11/14/2020 11:45 AM Imogene Burn, PA-C [facility]  12/04/2020 10:00 AM [facility]  PA/NP [facility] None  12/20/2020  9:00 AM Penumalli, Earlean Polka, MD [facility] None     ACTION: Home visit completed

## 2020-10-30 NOTE — Telephone Encounter (Signed)
Patient paged after hour answering service as patient got really confused after being told to be admitted to the hospital.  He is under the impression that he can wait until this Thursday to come in for the left and right heart cath.  I took a look at today's note, it appears the case was initially discussed with Dr. Shirlee Latch who recommended hospital admission however later based on the reassuring lab work, Dr. Shirlee Latch felt the patient can stay at home and come in for the procedure this Thursday.  I have called and personally spoke with Dr. Shirlee Latch who confirms that the patient does not need to be admitted today instead, he can just come in for the catheterization this Thursday.  Therefore we will cancel the hospital admission for now.

## 2020-10-30 NOTE — Patient Instructions (Signed)
STOP Coumadin -they coumadin clinic will be in contact regarding Lovenox bridge STOP Losartan START Entresto 24/26 mg, one tab twice a day      You are scheduled for a Cardiac Catheterization on Thursday, July 21 with Dr. Marca Ancona.  1. Please arrive at the [facility] (Main Entrance A) at [facility]: [address] [place], [place] [address] at 5:30 AM (This time is two hours before your procedure to ensure your preparation). Free valet parking service is available.   Special note: Every effort is made to have your procedure done on time. Please understand that emergencies sometimes delay scheduled procedures.  2. Diet: Do not eat solid foods after midnight.  The patient may have clear liquids until 5am upon the day of the procedure.  3. Labs: Pre procedure labs done 10/30/2020  4. Medication instructions in preparation for your procedure:   Contrast Allergy: No    Stop taking Coumadin (Warfarin) on Monday, July 18.  Stop taking, Lasix (Furosemide)  Thursday, July 21,  Take only 9 units of insulin the night before your procedure. Do not take any insulin on the day of the procedure.  Do not take Diabetes Med Glucophage (Metformin) on the day of the procedure and HOLD 48 HOURS AFTER THE PROCEDURE.  On the morning of your procedure, take your Aspirin and any morning medicines NOT listed above.  You may use sips of water.  5. Plan for one night stay--bring personal belongings. 6. Bring a current list of your medications and current insurance cards. 7. You MUST have a responsible person to drive you home. 8. Someone MUST be with you the first 24 hours after you arrive home or your discharge will be delayed. 9. Please wear clothes that are easy to get on and off and wear slip-on shoes.  Thank you for allowing [place] to care for you!   -- [facility]

## 2020-10-30 NOTE — Addendum Note (Signed)
Encounter addended by: Faythe Casa, CMA on: 10/30/2020 2:08 PM  Actions taken: Vitals modified

## 2020-10-30 NOTE — Addendum Note (Signed)
Encounter addended by: Theresia Bough, CMA on: 10/30/2020 4:45 PM  Actions taken: Medication long-term status modified, Diagnosis association updated, Order list changed, Pharmacy for encounter modified, Clinical Note Signed

## 2020-10-30 NOTE — Addendum Note (Signed)
Encounter addended by: Laurey Morale, MD on: 10/30/2020 5:12 PM  Actions taken: Clinical Note Signed, Charge Capture section accepted, Level of Service modified, Pharmacy for encounter modified

## 2020-10-30 NOTE — H&P (View-Only) (Signed)
PCP: Dr. Deborah Johnson Primary Cardiologist: Dr. Novalyn Lajara  HPI: 35 yo male with diabetes, HTN, and a long history of nonischemic cardiomyopathy. He had a cardiac MRI in 1/08 showing low EF, but he says that he had been told about "heart problems" even prior to that.  He does not have a family history of cardiomyopathy that he knows of, but does not know his father's family.  Coronary CT angiogram in 2014 showed no CAD. Echo in 2/17 showed EF 20-25%, similar to 2014.  He has generally had sporadic followup over the last few years, but has been seeing Dr Nishan recently. He was seen in the [facility] in 2017 but no f/u since. Last f/u w/ Cardiology was in 2020. Had repeat echo in 2020 showing LVEF 15-20%. RV normal. Most recently, he was diagnosed w/ COVID in April.  He presented to [facility] ED 09/11/20 with tingling in left arm, left face, drooling and blank stare for about 1 week, and admitted for possible seizure, HHS and possible CVA.  In ED, he had left-sided neurologic deficit with dysarthria.  CT head with probable subacute infarct in right frontal lobe and old right parietal infarct.  Reportedly, patient's neurologic deficits resolved in ED, and focus shifted to possible seizure versus stroke.  He was loaded with Keppra.   MRI brain with probable hemorrhagic infarct involving the anterior right frontal lobe and left parietal occipital cortex with additional chronic right parietal infarct, right frontotemporal region enhancement suggesting changes related to acute seizure, possible toxic insult involving the caudate and lentiform nuclei bilaterally.  Neurology recommended interval follow-up brain MRI.  CTA head and neck without significant finding.   TTE with EF of 10-15%, LV thrombus, moderate RV dysfunction and dilated IVC. He was discharged on GDMT, but A1c too high for SGLT2i.    Seen in [facility] ED 09/21/20 for fall after syncopal episode on toilet, hitting his head. CT of the head was significant for no bleeding  but new low-density left cerebellum suggesting acute infarct. EEG unremarkable and felt fall secondary to vagal episode not seizure.  He returned 6/22 for post hospital HF follow up. Overall feeling fine but is not exerting himself physically. Denies increasing SOB, CP, dizziness, edema, or PND/Orthopnea. Appetite ok. No fever or chills. Weight at home 150-152 pounds. Taking all medications. Counting carbs and checking blood sugars TID.  Today he returns for HF follow up. Overall feeling poorly. For the past 1-2 weeks he has had spontaneously occurring chest pain, mid sternal, that spontaneously resolves. No radiation or associated diaphoresis. Unable to quantify length of episodes. Some tingling in left arm when he reaches across his body. Worsening dyspnea with lifting/pushing. Overall feeling sluggish. Denies increasing dizziness, edema, or PND/Orthopnea. Appetite ok. No fever or chills. Weight at home 157 pounds. Taking all medications. No ETOH or drugs. Says his blood sugars have been stable.  ROS: All systems negative except as listed in HPI, PMH and Problem List.  SH:  Social History   Socioeconomic History   Marital status: Married    Spouse name: Not on file   Number of children: Not on file   Years of education: Not on file   Highest education level: Not on file  Occupational History   Occupation: unemployed  Tobacco Use   Smoking status: Never   Smokeless tobacco: Never  Vaping Use   Vaping Use: Not on file  Substance and Sexual Activity   Alcohol use: Not Currently    Comment: "occasional" when "hanging   out with the wrong people" No recent use.   Drug use: Not Currently    Types: Marijuana    Comment: occasional, last 2013   Sexual activity: Not Currently  Other Topics Concern   Not on file  Social History Narrative   Pt lives alone. Has no information on father.   Social Determinants of Health   Financial Resource Strain: High Risk   Difficulty of Paying Living  Expenses: Hard  Food Insecurity: No Food Insecurity   Worried About Running Out of Food in the Last Year: Never true   Ran Out of Food in the Last Year: Never true  Transportation Needs: Unmet Transportation Needs   Lack of Transportation (Medical): Yes   Lack of Transportation (Non-Medical): Yes  Physical Activity: Not on file  Stress: Not on file  Social Connections: Not on file  Intimate Partner Violence: Not on file   FH:  Family History  Problem Relation Age of Onset   Stroke Mother    Diabetes Mother    Hypertension Mother    Stroke Maternal Aunt    Heart attack Neg Hx    Past Medical History:  Diagnosis Date   Chronic systolic CHF (congestive heart failure) (HCC) 06/18/2015   Diabetes mellitus without complication (HCC)    Hemorrhagic stroke (HCC)    Hyperlipidemia    Hypertension    Nonischemic cardiomyopathy (HCC) Noted as early as 2007   Per chart review (cards consult note 2011), EF of 40% in 2007, down to 20-25% in 2011   Current Outpatient Medications  Medication Sig Dispense Refill   acetaminophen (TYLENOL) 500 MG tablet Take 1,500 mg by mouth every 6 (six) hours as needed for moderate pain or headache.     aspirin 81 MG EC tablet Take 1 tablet (81 mg total) by mouth daily. Swallow whole. 30 tablet 2   atorvastatin (LIPITOR) 80 MG tablet Take 1 tablet (80 mg total) by mouth daily. 30 tablet 2   Blood Glucose Monitoring Suppl (TRUE METRIX METER) w/Device KIT 1 each by Does not apply route 3 (three) times daily. 1 kit 0   Blood Pressure Monitor DEVI Use as directed to check home blood pressure 2-3 times a week 1 Device 0   digoxin (LANOXIN) 0.125 MG tablet Take 1 tablet (0.125 mg total) by mouth daily. 30 tablet 2   ezetimibe (ZETIA) 10 MG tablet Take 1 tablet (10 mg total) by mouth daily. 30 tablet 2   furosemide (LASIX) 20 MG tablet Take 1 tablet (20 mg total) by mouth daily. 30 tablet 2   glucose blood test strip Use as instructed 100 each 12   insulin  isophane & regular human (NOVOLIN 70/30 FLEXPEN) (70-30) 100 UNIT/ML KwikPen 22 units SQ Q a.m and 18 units Q p.m 13.2 mL 5   Insulin Pen Needle (PEN NEEDLES) 31G X 8 MM MISC UAD 100 each 6   Insulin Pen Needle 32G X 4 MM MISC Use in the morning, at noon, and at bedtime. 100 each 0   Insulin Syringe-Needle U-100 (RELION INSULIN SYRINGE) 31G X 15/64" 0.3 ML MISC Use to inject insulin daily. 100 each 11   ivabradine (CORLANOR) 7.5 MG TABS tablet Take 1 tablet (7.5 mg total) by mouth 2 (two) times daily with a meal. 60 tablet    levETIRAcetam (KEPPRA) 750 MG tablet Take 2 tablets (1,500 mg total) by mouth 2 (two) times daily. 120 tablet 2   losartan (COZAAR) 25 MG tablet Take 1/2 tablet (12.5 mg   total) by mouth daily. 15 tablet 2   metFORMIN (GLUCOPHAGE) 500 MG tablet Take 1 tablet (500 mg total) by mouth 2 (two) times daily with a meal. 60 tablet 4   metoprolol (TOPROL-XL) 200 MG 24 hr tablet Take 1 tablet (200 mg total) by mouth daily. Take with or immediately following a meal. 30 tablet 2   Oxcarbazepine (TRILEPTAL) 300 MG tablet Take 2 tablets (600 mg total) by mouth 2 (two) times daily. 120 tablet 2   phenytoin (DILANTIN) 100 MG ER capsule Take 1 capsule (100 mg total) by mouth 3 (three) times daily. 90 capsule 2   pregabalin (LYRICA) 25 MG capsule Take 1 capsule (25 mg total) by mouth 2 (two) times daily. 60 capsule 3   sildenafil (VIAGRA) 50 MG tablet TAKE 1 TABLET BY MOUTH 1/2-1 HOUR PRIOR TO SEX AS NEEDED. MAX 1 TABLET PER 24 HOURS 10 tablet 4   spironolactone (ALDACTONE) 25 MG tablet Take 1/2 tablet (12.5 mg total) by mouth daily. 15 tablet 2   warfarin (COUMADIN) 5 MG tablet Take 1 tablet (5 mg total) by mouth daily. 30 tablet 2   No current facility-administered medications for this encounter.   BP 110/77   Pulse 87   Wt 71.2 kg (157 lb)   SpO2 97%   BMI 21.90 kg/m   Wt Readings from Last 3 Encounters:  10/30/20 71.2 kg (157 lb)  10/27/20 72.5 kg (159 lb 12.8 oz)  10/17/20 71.7  kg (158 lb)   PHYSICAL EXAM: General:  NAD. No resp difficulty, thin. HEENT: Normal Neck: Supple. No JVD. Carotids 2+ bilat; no bruits. No lymphadenopathy or thryomegaly appreciated. Cor: PMI nondisplaced. Regular rate & rhythm. No rubs, gallops or murmurs. Lungs: Clear Abdomen: Soft, nontender, nondistended. No hepatosplenomegaly. No bruits or masses. Good bowel sounds. Extremities: No cyanosis, clubbing, rash, edema Neuro: Alert & oriented x 3, cranial nerves grossly intact. Moves all 4 extremities w/o difficulty. Affect pleasant.  ECG: SR 87 bpm, iRBBB (personally reviewed). ReDs Clip: 39%  ASSESSMENT & PLAN:  1. Acute chest pain: Atypical CP x 1-2 weeks. Unlikely PE as he is on Coumadin. Coronary CT angiogram in 2014 showed no CAD. - He has worrisome risk factors -->uncontrolled DM (a1c 15.5) and HLD (LDL better now on atorva + Zetia). - Personally discussed with Dr. Aundra Dubin, at this point think best course will be R/LHC to evaluate heart pressures and cors. 2. Chronic systolic CHF: Nonischemic cardiomyopathy known for years.  Normal coronary CTA in 2014.  Echo this admission with EF 10-15%, LV thrombus, moderate RV dysfunction, dilated IVC. With markedly high LDL and poorly controlled glucose, concern for development of CAD.Marland Kitchen  On exam, he does not look volume overloaded but ReDs mildly elevated at 39%. - NYHA II.  - Stop losartan. Start Entresto 24/26/mg bid. BMET today. - Continue Toprol XL 200 mg daily. - Continue Ivabradine 7.5 mg bid. HR 87. - Continue digoxin 0.125. Check dig level today. - Continue spironolactone 12.5 daily. - Continue Lasix 20 mg daily.  - Continue Losartan 12.5 daily. - Hgb A1c too high for SGLT2i (>15.5). - Continue with Paramedicine. 3. LV thrombus: With associated CVA. - On Coumadin. INR today. Has been subtherapeutic. 4. 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  Still with some dysarthria. - Anticoagulation  ok per neurology. - Atorvastatin. 5. 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  Now on Keppra. 6.  Hyperlipidemia: Markedly high LDL.   - Continue atorvastatin 80 mg daily +  Zetia. - F/u LP 6-8 weeks. Lipid Clinic referral if LDL not at goal after improved compliance. 7.  Diabetes: Poor control. Hgb A1c >15.5. - On Novolog 70/30, glipizide, and metformin. - Not candidate for SGLT2i.    Discussed with Dr. Aundra Dubin. Will admit patient to tele, start on heparin gtt and complete R/LHC when INR<2.0  [facility], FNP-BC 10/30/20  Addendum: Unable to secure inpatient bed for patient at this time. Discussed with Dr. Aundra Dubin, patient's HsTroponin negative. We will send him home and have Coumadin clinic bridge with Lovenox. He will hold his Entresto day before cath. INR and BMET morning of cath. R/LHC scheduled for 11/02/20 @ 7:20 with Dr. Aundra Dubin.  Patient seen with NP, agree with the above note .  He comes in for "sick visit" today because of chest pain.  He has had episodes for the last 2 wks, worse over the last week.  Pain is on and off, not continuous.  He cannot identify a trigger.  It is an aching in his chest, episodes this week have been worse.  It is not pleuritic and not clearly positional.  It is not clearly exertional.  He has generalized fatigue and worsened dyspnea with moderate exertion.     REDS clip 39%.   ECG reviewed, shows nonspecific lateral TWIs similar to prior.    General: NAD Neck: No JVD, no thyromegaly or thyroid nodule.  Lungs: Clear to auscultation bilaterally with normal respiratory effort. CV: Nondisplaced PMI.  Heart regular S1/S2, no S3/S4, no murmur.  No peripheral edema.   Abdomen: Soft, nontender, no hepatosplenomegaly, no distention.  Skin: Intact without lesions or rashes.  Neurologic: Alert and oriented x 3.  Psych: Normal affect. Extremities: No clubbing or cyanosis.  HEENT: Normal.   1. Chest pain: Patient has had episodes of atypical chest pain, worsening  over the last 2 wks.  ECG with nonspecific abnormalities, not significantly changed.  He had a remote CTA (2014) with no significant CAD. With markedly high LDL until statin recently started and poorly controlled glucose, concern for development of CAD.  Could coronary disease be contributing to his cardiomyopathy?  Cath was deferred last admission due to recent CVA (09/11/20).  He is now > 6 wks out from CVA.   - Cycle troponin.  - Will admit for observation, will plan left/right heart cath in am as long as INR < 2.  Discussed risks and benefits with patient, he agrees to procedure.  Will hold warfarin for now, start heparin gtt when INR < 2 (will need bridging anticoagulation given LV thrombus history).  2. Chronic systolic CHF: Cardiomyopathy known for years.  Normal coronary CTA remotely in 2014.  Echo 5/22 with EF 10-15%, LV thrombus, moderate RV dysfunction, dilated IVC. With markedly high LDL and poorly controlled glucose, concern for development of CAD.  On exam, he does not look volume overloaded though REDS clip is mildly elevated at 39%.  Creatinine and BP stable.  NYHA class III symptoms.  Significant fatigue, could he have low output HF?  - Continue Toprol XL 200 mg daily - Continue Ivabradine to  7.5 mg bid - Continue digoxin 0.125, level ok today at 0.7.  - Continue Spironolactone 12.5 daily. - Can continue Lasix 20 mg daily, but will stop losartan and start Entresto 24/26 bid.  This should give a bit extra diuresis.  - Hgb A1c has been too high for SGLT2i (>15.5 at prior check)  - Plan right/left cath given chest pain and risk factors  as well as some degree of volume overload and concern for low output as above. 2. LV thrombus: With associated CVA 09/11/20. - Holding warfarin for cath but will need bridging anticoagulation.  3. 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  Still with some mild dysarthria. - Continue atorvastatin.  4. Seizures: Likely related  to CVA.  Now on Keppra. 5.  Hyperlipidemia: Markedly high LDL (305 in 5/22, but now down to 94 in 7/22).   - Continue atorvastatin 80 mg and Zetia 10 mg daily.  - Goal LDL < 70, will refer to lipid clinic for Repatha.  6.  Diabetes: Poor control. Hgb A1c >15.5 - Home meds + SSI.   Loralie Champagne 10/30/2020 1:42 PM  HS-TnI not significantly elevated at 21.  There is no bed available in the hospital.   INR is 2.6.  We will hold warfarin and cover with Lovenox when INR < 2.  We have arranged for LHC/RHC on Thursday at 7:30 am.  We are going to let Mr Monteforte go home and return for cath on Thursday.  If symptoms worsen prior to then, he will return to the ER.   Loralie Champagne 10/30/2020 5:11 PM

## 2020-10-30 NOTE — Progress Notes (Signed)
Medication Samples have been provided to the patient.  Drug name: Corlanor       Strength: 5mg         Qty: 2  LOT  Exp.Date: 7/25  Dosing instructions: Take 1.5 tabs Twice daily   The patient has been instructed regarding the correct time, dose, and frequency of taking this medication, including desired effects and most common side effects.   Jason Stevenson 12:18 PM 10/30/2020

## 2020-10-31 ENCOUNTER — Ambulatory Visit (INDEPENDENT_AMBULATORY_CARE_PROVIDER_SITE_OTHER): Payer: Medicaid Other | Admitting: *Deleted

## 2020-10-31 ENCOUNTER — Other Ambulatory Visit: Payer: Self-pay

## 2020-10-31 ENCOUNTER — Encounter (HOSPITAL_COMMUNITY): Payer: Self-pay

## 2020-10-31 DIAGNOSIS — I513 Intracardiac thrombosis, not elsewhere classified: Secondary | ICD-10-CM

## 2020-10-31 DIAGNOSIS — Z5181 Encounter for therapeutic drug level monitoring: Secondary | ICD-10-CM | POA: Diagnosis not present

## 2020-10-31 DIAGNOSIS — Z7901 Long term (current) use of anticoagulants: Secondary | ICD-10-CM

## 2020-10-31 DIAGNOSIS — I639 Cerebral infarction, unspecified: Secondary | ICD-10-CM

## 2020-10-31 LAB — POCT INR: INR: 2.4 (ref 2.0–3.0)

## 2020-10-31 NOTE — Patient Instructions (Addendum)
Description   7/19: Hold warfarin 7/20: Hold warfarin. At 7 am inject Lovenox 80mg  into your fatty abdominal tissue.  7/21: Resume warfarin the evening of procedure or when okay to by Dr and take an extra 1/2 tablet for 2 days along with your normal dose.  -Normal dose of warfarin: 1 tablet daily except for 1.5 tablets on Tuesdays, Thursdays, and Saturdays. -Coumadin Clinic [phone].

## 2020-11-01 NOTE — Progress Notes (Deleted)
Cardiology Office Note    Date:  11/01/2020   ID:  Jason Stevenson, DOB 22-Nov-1985, MRN [id number]   PCP:  Marcine Matar, MD   [facility]  Cardiologist:  Charlton Haws, MD *** Advanced Practice Provider:  No care team member to display Electrophysiologist:  None   [phone]   No chief complaint on file.   History of Present Illness:  Jason Stevenson is a 35 y.o. male with history of NICM on cardiac MRI 1/208-no late gadolinium enhancement, HTN, HLD, DM, Coronary CTA 2014-Calcium score 0,no CAD, last echo 08/2015 EF 15-20%. Was followed in CHF clinic for awhile. Last saw Dr. Eden Emms 02/2016. Dr. Shirlee Latch had recommended ICD if EF remained low when last seen 08/07/15 but no f/u since.      Past Medical History:  Diagnosis Date   Chronic systolic CHF (congestive heart failure) (HCC) 06/18/2015   Diabetes mellitus without complication (HCC)    Hemorrhagic stroke (HCC)    Hyperlipidemia    Hypertension    Nonischemic cardiomyopathy (HCC) Noted as early as 2007   Per chart review (cards consult note 2011), EF of 40% in 2007, down to 20-25% in 2011    Past Surgical History:  Procedure Laterality Date   None      Current Medications: No outpatient medications have been marked as taking for the 11/14/20 encounter (Appointment) with Dyann Kief, PA-C.     Allergies:   Patient has no known allergies.   Social History   Socioeconomic History   Marital status: Married    Spouse name: Not on file   Number of children: Not on file   Years of education: Not on file   Highest education level: Not on file  Occupational History   Occupation: unemployed  Tobacco Use   Smoking status: Never   Smokeless tobacco: Never  Vaping Use   Vaping Use: Not on file  Substance and Sexual Activity   Alcohol use: Not Currently    Comment: "occasional" when "hanging out with the wrong people" No recent use.   Drug use: Not Currently    Types: Marijuana     Comment: occasional, last 2013   Sexual activity: Not Currently  Other Topics Concern   Not on file  Social History Narrative   Pt lives alone. Has no information on father.   Social Determinants of Health   Financial Resource Strain: High Risk   Difficulty of Paying Living Expenses: Hard  Food Insecurity: No Food Insecurity   Worried About Programme researcher, broadcasting/film/video in the Last Year: Never true   Barista in the Last Year: Never true  Transportation Needs: Unmet Transportation Needs   Lack of Transportation (Medical): Yes   Lack of Transportation (Non-Medical): Yes  Physical Activity: Not on file  Stress: Not on file  Social Connections: Not on file     Family History:  The patient's ***family history includes Diabetes in his mother; Hypertension in his mother; Stroke in his maternal aunt and mother.   ROS:   Please see the history of present illness.    ROS All other systems reviewed and are negative.   PHYSICAL EXAM:   VS:  There were no vitals taken for this visit.  Physical Exam  GEN: Well nourished, well developed, in no acute distress  HEENT: normal  Neck: no JVD, carotid bruits, or masses Cardiac:RRR; no murmurs, rubs, or gallops  Respiratory:  clear to auscultation bilaterally, normal work of  breathing GI: soft, nontender, nondistended, + BS Ext: without cyanosis, clubbing, or edema, Good distal pulses bilaterally MS: no deformity or atrophy  Skin: warm and dry, no rash Neuro:  Alert and Oriented x 3, Strength and sensation are intact Psych: euthymic mood, full affect  Wt Readings from Last 3 Encounters:  10/30/20 157 lb (71.2 kg)  10/30/20 156 lb 11.2 oz (71.1 kg)  10/27/20 159 lb 12.8 oz (72.5 kg)      Studies/Labs Reviewed:   EKG:  EKG is*** ordered today.  The ekg ordered today demonstrates ***  Recent Labs: 09/14/2020: TSH 1.009 09/19/2020: Magnesium 1.9 09/22/2020: ALT 42 10/30/2020: B Natriuretic Peptide 112.2; BUN 13; Creatinine, Ser 0.84;  Hemoglobin 12.9; Platelets 220; Potassium 3.8; Sodium 140   Lipid Panel    Component Value Date/Time   CHOL 181 10/24/2020 1115   TRIG 113 10/24/2020 1115   HDL 67 10/24/2020 1115   CHOLHDL 2.7 10/24/2020 1115   CHOLHDL 8.4 09/12/2020 0201   VLDL 30 09/12/2020 0201   LDLCALC 94 10/24/2020 1115    Additional studies/ records that were reviewed today include:  ***   Risk Assessment/Calculations:   {Does this patient have ATRIAL FIBRILLATION?[phone]}     ASSESSMENT:    No diagnosis found.   PLAN:  In order of problems listed above:    Shared Decision Making/Informed Consent   {Are you ordering a CV Procedure (e.g. stress test, cath, DCCV, TEE, etc)?   Press F2        :[id number]}    Medication Adjustments/Labs and Tests Ordered: Current medicines are reviewed at length with the patient today.  Concerns regarding medicines are outlined above.  Medication changes, Labs and Tests ordered today are listed in the Patient Instructions below. There are no Patient Instructions on file for this visit.   Signed, Jacolyn Reedy, PA-C  11/01/2020 2:52 PM    [facility] [address] [place], [place], [place]  [address] Phone: [phone]; Fax: [phone]

## 2020-11-02 ENCOUNTER — Encounter (HOSPITAL_COMMUNITY): Payer: Self-pay | Admitting: Cardiology

## 2020-11-02 ENCOUNTER — Telehealth: Payer: Self-pay | Admitting: Pharmacist

## 2020-11-02 ENCOUNTER — Other Ambulatory Visit (HOSPITAL_COMMUNITY): Payer: Self-pay

## 2020-11-02 ENCOUNTER — Encounter (HOSPITAL_COMMUNITY)
Admission: RE | Disposition: A | Discharge status: Home / Self Care | Payer: Self-pay | Source: Home / Self Care | Attending: Cardiology

## 2020-11-02 ENCOUNTER — Other Ambulatory Visit: Payer: Self-pay

## 2020-11-02 ENCOUNTER — Ambulatory Visit (HOSPITAL_COMMUNITY)
Admission: RE | Admit: 2020-11-02 | Discharge: 2020-11-02 | Disposition: A | Discharge status: Home / Self Care | Payer: Medicaid Other | Attending: Cardiology | Admitting: Cardiology

## 2020-11-02 DIAGNOSIS — R569 Unspecified convulsions: Secondary | ICD-10-CM | POA: Insufficient documentation

## 2020-11-02 DIAGNOSIS — Z79899 Other long term (current) drug therapy: Secondary | ICD-10-CM | POA: Diagnosis not present

## 2020-11-02 DIAGNOSIS — Z7982 Long term (current) use of aspirin: Secondary | ICD-10-CM | POA: Insufficient documentation

## 2020-11-02 DIAGNOSIS — R0789 Other chest pain: Secondary | ICD-10-CM | POA: Insufficient documentation

## 2020-11-02 DIAGNOSIS — E785 Hyperlipidemia, unspecified: Secondary | ICD-10-CM | POA: Diagnosis not present

## 2020-11-02 DIAGNOSIS — I429 Cardiomyopathy, unspecified: Secondary | ICD-10-CM

## 2020-11-02 DIAGNOSIS — Z794 Long term (current) use of insulin: Secondary | ICD-10-CM | POA: Diagnosis not present

## 2020-11-02 DIAGNOSIS — E1165 Type 2 diabetes mellitus with hyperglycemia: Secondary | ICD-10-CM | POA: Insufficient documentation

## 2020-11-02 DIAGNOSIS — Z7901 Long term (current) use of anticoagulants: Secondary | ICD-10-CM | POA: Insufficient documentation

## 2020-11-02 DIAGNOSIS — Z7984 Long term (current) use of oral hypoglycemic drugs: Secondary | ICD-10-CM | POA: Insufficient documentation

## 2020-11-02 DIAGNOSIS — I110 Hypertensive heart disease with heart failure: Secondary | ICD-10-CM | POA: Insufficient documentation

## 2020-11-02 DIAGNOSIS — I428 Other cardiomyopathies: Secondary | ICD-10-CM | POA: Diagnosis not present

## 2020-11-02 DIAGNOSIS — R079 Chest pain, unspecified: Secondary | ICD-10-CM

## 2020-11-02 DIAGNOSIS — I639 Cerebral infarction, unspecified: Secondary | ICD-10-CM | POA: Insufficient documentation

## 2020-11-02 DIAGNOSIS — I509 Heart failure, unspecified: Secondary | ICD-10-CM

## 2020-11-02 DIAGNOSIS — I5022 Chronic systolic (congestive) heart failure: Secondary | ICD-10-CM | POA: Diagnosis not present

## 2020-11-02 HISTORY — PX: RIGHT/LEFT HEART CATH AND CORONARY ANGIOGRAPHY: CATH118266

## 2020-11-02 LAB — POCT I-STAT EG7
Acid-Base Excess: 1 mmol/L (ref 0.0–2.0)
Acid-Base Excess: 3 mmol/L — ABNORMAL HIGH (ref 0.0–2.0)
Bicarbonate: 27.8 mmol/L (ref 20.0–28.0)
Bicarbonate: 29.6 mmol/L — ABNORMAL HIGH (ref 20.0–28.0)
Calcium, Ion: 1.19 mmol/L (ref 1.15–1.40)
Calcium, Ion: 1.27 mmol/L (ref 1.15–1.40)
HCT: 36 % — ABNORMAL LOW (ref 39.0–52.0)
HCT: 37 % — ABNORMAL LOW (ref 39.0–52.0)
Hemoglobin: 12.2 g/dL — ABNORMAL LOW (ref 13.0–17.0)
Hemoglobin: 12.6 g/dL — ABNORMAL LOW (ref 13.0–17.0)
O2 Saturation: 73 %
O2 Saturation: 73 %
Potassium: 3.9 mmol/L (ref 3.5–5.1)
Potassium: 4.1 mmol/L (ref 3.5–5.1)
Sodium: 141 mmol/L (ref 135–145)
Sodium: 143 mmol/L (ref 135–145)
TCO2: 29 mmol/L (ref 22–32)
TCO2: 31 mmol/L (ref 22–32)
pCO2, Ven: 51.1 mmHg (ref 44.0–60.0)
pCO2, Ven: 53.3 mmHg (ref 44.0–60.0)
pH, Ven: 7.344 (ref 7.250–7.430)
pH, Ven: 7.352 (ref 7.250–7.430)
pO2, Ven: 41 mmHg (ref 32.0–45.0)
pO2, Ven: 41 mmHg (ref 32.0–45.0)

## 2020-11-02 LAB — GLUCOSE, CAPILLARY
Glucose-Capillary: 107 mg/dL — ABNORMAL HIGH (ref 70–99)
Glucose-Capillary: 102 mg/dL — ABNORMAL HIGH (ref 70–99)

## 2020-11-02 LAB — PROTIME-INR
INR: 1.3 — ABNORMAL HIGH (ref 0.8–1.2)
Prothrombin Time: 16.1 seconds — ABNORMAL HIGH (ref 11.4–15.2)

## 2020-11-02 SURGERY — RIGHT/LEFT HEART CATH AND CORONARY ANGIOGRAPHY
Anesthesia: LOCAL

## 2020-11-02 MED ORDER — HYDRALAZINE HCL 20 MG/ML IJ SOLN: 10.0000 mg | INTRAMUSCULAR | Status: DC | PRN

## 2020-11-02 MED ORDER — VERAPAMIL HCL 2.5 MG/ML IV SOLN
INTRAVENOUS | Status: AC
  Filled 2020-11-02: qty 2

## 2020-11-02 MED ORDER — HEPARIN (PORCINE) IN NACL 1000-0.9 UT/500ML-% IV SOLN
INTRAVENOUS | Status: DC | PRN
  Administered 2020-11-02 (×2): 500 mL

## 2020-11-02 MED ORDER — SODIUM CHLORIDE 0.9 % IV SOLN: INTRAVENOUS | Status: DC

## 2020-11-02 MED ORDER — SODIUM CHLORIDE 0.9% FLUSH: 3.0000 mL | Freq: Two times a day (BID) | INTRAVENOUS | Status: DC

## 2020-11-02 MED ORDER — FENTANYL CITRATE (PF) 100 MCG/2ML IJ SOLN
INTRAMUSCULAR | Status: AC
  Filled 2020-11-02: qty 2

## 2020-11-02 MED ORDER — ENOXAPARIN SODIUM 100 MG/ML IJ SOSY
100.0000 mg | PREFILLED_SYRINGE | INTRAMUSCULAR | 0 refills | Status: DC
  Filled 2020-11-02: qty 5, 5d supply, fill #0

## 2020-11-02 MED ORDER — SODIUM CHLORIDE 0.9 % IV SOLN: 250.0000 mL | INTRAVENOUS | Status: DC | PRN

## 2020-11-02 MED ORDER — HEPARIN SODIUM (PORCINE) 1000 UNIT/ML IJ SOLN
INTRAMUSCULAR | Status: AC
  Filled 2020-11-02: qty 1

## 2020-11-02 MED ORDER — SODIUM CHLORIDE 0.9% FLUSH: 3.0000 mL | INTRAVENOUS | Status: DC | PRN

## 2020-11-02 MED ORDER — VERAPAMIL HCL 2.5 MG/ML IV SOLN
INTRAVENOUS | Status: DC | PRN
  Administered 2020-11-02: 10 mL via INTRA_ARTERIAL

## 2020-11-02 MED ORDER — MIDAZOLAM HCL 2 MG/2ML IJ SOLN
INTRAMUSCULAR | Status: AC
  Filled 2020-11-02: qty 2

## 2020-11-02 MED ORDER — LIDOCAINE HCL (PF) 1 % IJ SOLN
INTRAMUSCULAR | Status: DC | PRN
  Administered 2020-11-02 (×2): 2 mL via INTRADERMAL

## 2020-11-02 MED ORDER — HEPARIN (PORCINE) IN NACL 1000-0.9 UT/500ML-% IV SOLN
INTRAVENOUS | Status: AC
  Filled 2020-11-02: qty 1000

## 2020-11-02 MED ORDER — LIDOCAINE HCL (PF) 1 % IJ SOLN
INTRAMUSCULAR | Status: AC
  Filled 2020-11-02: qty 30

## 2020-11-02 MED ORDER — ONDANSETRON HCL 4 MG/2ML IJ SOLN: 4.0000 mg | Freq: Four times a day (QID) | INTRAMUSCULAR | Status: DC | PRN

## 2020-11-02 MED ORDER — ASPIRIN 81 MG PO CHEW
81.0000 mg | CHEWABLE_TABLET | ORAL | Status: DC
Start: 2020-11-03 — End: 2020-11-02

## 2020-11-02 MED ORDER — FENTANYL CITRATE (PF) 100 MCG/2ML IJ SOLN
INTRAMUSCULAR | Status: DC | PRN
  Administered 2020-11-02: 25 ug via INTRAVENOUS

## 2020-11-02 MED ORDER — MIDAZOLAM HCL 2 MG/2ML IJ SOLN
INTRAMUSCULAR | Status: DC | PRN
  Administered 2020-11-02: 1 mg via INTRAVENOUS

## 2020-11-02 MED ORDER — HEPARIN SODIUM (PORCINE) 1000 UNIT/ML IJ SOLN
INTRAMUSCULAR | Status: DC | PRN
  Administered 2020-11-02: 4000 [IU] via INTRAVENOUS

## 2020-11-02 MED ORDER — LABETALOL HCL 5 MG/ML IV SOLN: 10.0000 mg | INTRAVENOUS | Status: DC | PRN

## 2020-11-02 MED ORDER — ACETAMINOPHEN 325 MG PO TABS: 650.0000 mg | ORAL_TABLET | ORAL | Status: DC | PRN

## 2020-11-02 MED ORDER — IOHEXOL 350 MG/ML SOLN
INTRAVENOUS | Status: DC | PRN
  Administered 2020-11-02: 35 mL

## 2020-11-02 SURGICAL SUPPLY — 11 items
CATH 5FR JL3.5 JR4 ANG PIG MP (CATHETERS) ×1 IMPLANT
CATH BALLN WEDGE 5F 110CM (CATHETERS) ×1 IMPLANT
DEVICE RAD TR BAND REGULAR (VASCULAR PRODUCTS) ×1 IMPLANT
GLIDESHEATH SLEND SS 6F .021 (SHEATH) ×1 IMPLANT
GUIDEWIRE .025 260CM (WIRE) ×1 IMPLANT
GUIDEWIRE INQWIRE 1.5J.035X260 (WIRE) IMPLANT
INQWIRE 1.5J .035X260CM (WIRE) ×2
KIT HEART LEFT (KITS) ×2 IMPLANT
PACK CARDIAC CATHETERIZATION (CUSTOM PROCEDURE TRAY) ×2 IMPLANT
SHEATH GLIDE SLENDER 4/5FR (SHEATH) ×1 IMPLANT
TRANSDUCER W/STOPCOCK (MISCELLANEOUS) ×2 IMPLANT

## 2020-11-02 NOTE — Interval H&P Note (Signed)
History and Physical Interval Note:  11/02/2020 8:14 AM  Jason Stevenson  has presented today for surgery, with the diagnosis of heart failure.  The various methods of treatment have been discussed with the patient and family. After consideration of risks, benefits and other options for treatment, the patient has consented to  Procedure(s): RIGHT/LEFT HEART CATH AND CORONARY ANGIOGRAPHY (N/A) as a surgical intervention.  The patient's history has been reviewed, patient examined, no change in status, stable for surgery.  I have reviewed the patient's chart and labs.  Questions were answered to the patient's satisfaction.     Haynes Giannotti [employer]

## 2020-11-02 NOTE — Telephone Encounter (Addendum)
Leota Sauers, inpatient pharmacist called clinic to clarify pt's Lovenox bridge. He was seen for INR check on 7/19 and given pre-cath instructions for Lovenox bridge but  follow up bridge post-procedure. Pt has hx of LV mural thrombus and CVA and requires post-op bridging as well. INR 1.3 today at time of cath.  Pt is being discharged today and can resume Lovenox this evening. Provided instructions to dose at 1.5mg /kg once daily = 100mg  once daily. will send Lovenox rx to [facility] pharmacy to check pricing. Doesn't look like pt has insurance so hopefully HF fund can be used. Will need 4-5 Lovenox syringes. If copay is an issue inpatient, can use GoodRx coupon - 4 syringes are ~$35 at [facility].  Scheduled follow up INR check for pt on Monday 7/25.

## 2020-11-02 NOTE — Discharge Instructions (Addendum)
Restart Lovenox injections and warfarin this evening.  Continue as directed by coumadin clinic.

## 2020-11-03 ENCOUNTER — Other Ambulatory Visit: Payer: Self-pay

## 2020-11-04 ENCOUNTER — Telehealth: Payer: Self-pay | Admitting: Internal Medicine

## 2020-11-04 NOTE — Telephone Encounter (Signed)
-----   Message from Marvel Plan, MD sent at 10/30/2020  7:20 AM EDT ----- I agree. I can not remember what situation made him to be on both ASA and coumadin, but from the cardiology note there is no specific reason for him to be on both. I agree coumadin alone should be fine from neuro standpoint. My two cents. Thanks.   Jindong   ----- Message ----- From: Suanne Marker, MD Sent: 10/29/2020   5:59 PM EDT To: Micki Riley, MD, Marvel Plan, MD, #  Hello Dr. Laural Benes, I will defer this question to our stroke neurologists who saw him in the hospital.  Dr. Pearlean Brownie and Dr. Roda Shutters, should this patient continue on both aspirin 81mg  + coumadin? It appears this was decided in the hospital course. (My understanding was that both aspirin + anticoagulation could be used some situations such as mechanical valve replacement or recent PCI or ACS, but not for other reasons.)  -VRP  ----- Message ----- From: , MD Sent: 10/27/2020   6:33 PM EDT To: 10/29/2020, MD  I am the PCP for this patient.  He has an appointment with you in September.  You probably saw him during hospitalization for hemorrhagic CVA and seizure.  He was also found to have a left ventricular thrombus.  He is on Coumadin.  He is also on baby aspirin.  I wanted to inquire whether he needs to continue the baby aspirin given that he is on Coumadin.

## 2020-11-06 ENCOUNTER — Ambulatory Visit (INDEPENDENT_AMBULATORY_CARE_PROVIDER_SITE_OTHER): Payer: Medicaid Other

## 2020-11-06 ENCOUNTER — Other Ambulatory Visit: Payer: Self-pay

## 2020-11-06 ENCOUNTER — Telehealth (HOSPITAL_COMMUNITY): Payer: Self-pay

## 2020-11-06 DIAGNOSIS — Z5181 Encounter for therapeutic drug level monitoring: Secondary | ICD-10-CM | POA: Diagnosis not present

## 2020-11-06 DIAGNOSIS — I639 Cerebral infarction, unspecified: Secondary | ICD-10-CM

## 2020-11-06 DIAGNOSIS — Z7901 Long term (current) use of anticoagulants: Secondary | ICD-10-CM

## 2020-11-06 DIAGNOSIS — I513 Intracardiac thrombosis, not elsewhere classified: Secondary | ICD-10-CM

## 2020-11-06 LAB — POCT INR: INR: 1.9 — AB (ref 2.0–3.0)

## 2020-11-06 NOTE — Telephone Encounter (Signed)
I called Mr Pacer to go over his INR result and discuss his dosing for the upcoming week. He did not answer so I left a message with the instructions but also requested he call me back so we can discuss his medications further.   Jacqualine Code, EMT 11/06/20

## 2020-11-06 NOTE — Patient Instructions (Signed)
Description    Take 1.5 tablets today, then resume same dosage 1 tablet daily except for 1.5 tablets on Tuesdays, Thursdays, and Saturdays.  Take your last Lovenox injection tonight, then discontinue.  Recheck in 1 week.  -Coumadin Clinic [phone].

## 2020-11-08 ENCOUNTER — Other Ambulatory Visit (HOSPITAL_BASED_OUTPATIENT_CLINIC_OR_DEPARTMENT_OTHER): Payer: Self-pay

## 2020-11-09 ENCOUNTER — Other Ambulatory Visit: Payer: Self-pay

## 2020-11-09 ENCOUNTER — Ambulatory Visit (HOSPITAL_COMMUNITY)
Admission: RE | Admit: 2020-11-09 | Discharge: 2020-11-09 | Disposition: A | Discharge status: Home / Self Care | Payer: Medicaid Other | Source: Ambulatory Visit | Attending: Cardiology | Admitting: Cardiology

## 2020-11-09 ENCOUNTER — Other Ambulatory Visit (HOSPITAL_COMMUNITY): Payer: Self-pay

## 2020-11-09 VITALS — BP 117/81 | HR 84 | Wt 159.0 lb

## 2020-11-09 DIAGNOSIS — I5022 Chronic systolic (congestive) heart failure: Secondary | ICD-10-CM

## 2020-11-09 DIAGNOSIS — I428 Other cardiomyopathies: Secondary | ICD-10-CM | POA: Diagnosis not present

## 2020-11-09 DIAGNOSIS — Z794 Long term (current) use of insulin: Secondary | ICD-10-CM | POA: Diagnosis not present

## 2020-11-09 DIAGNOSIS — Z79899 Other long term (current) drug therapy: Secondary | ICD-10-CM | POA: Diagnosis not present

## 2020-11-09 DIAGNOSIS — I513 Intracardiac thrombosis, not elsewhere classified: Secondary | ICD-10-CM | POA: Diagnosis not present

## 2020-11-09 DIAGNOSIS — E1165 Type 2 diabetes mellitus with hyperglycemia: Secondary | ICD-10-CM | POA: Insufficient documentation

## 2020-11-09 DIAGNOSIS — Z8616 Personal history of COVID-19: Secondary | ICD-10-CM | POA: Diagnosis not present

## 2020-11-09 DIAGNOSIS — Z7984 Long term (current) use of oral hypoglycemic drugs: Secondary | ICD-10-CM | POA: Diagnosis not present

## 2020-11-09 DIAGNOSIS — E785 Hyperlipidemia, unspecified: Secondary | ICD-10-CM | POA: Insufficient documentation

## 2020-11-09 DIAGNOSIS — Z7901 Long term (current) use of anticoagulants: Secondary | ICD-10-CM | POA: Diagnosis not present

## 2020-11-09 DIAGNOSIS — R569 Unspecified convulsions: Secondary | ICD-10-CM | POA: Diagnosis not present

## 2020-11-09 LAB — BASIC METABOLIC PANEL
Anion gap: 6 (ref 5–15)
BUN: 16 mg/dL (ref 6–20)
CO2: 30 mmol/L (ref 22–32)
Calcium: 8.9 mg/dL (ref 8.9–10.3)
Chloride: 103 mmol/L (ref 98–111)
Creatinine, Ser: 0.89 mg/dL (ref 0.61–1.24)
GFR, Estimated: >60 mL/min (ref >60)
Glucose, Bld: 184 mg/dL — ABNORMAL HIGH (ref 70–99)
Potassium: 4.1 mmol/L (ref 3.5–5.1)
Sodium: 139 mmol/L (ref 135–145)

## 2020-11-09 MED ORDER — SPIRONOLACTONE 25 MG PO TABS
25.0000 mg | ORAL_TABLET | Freq: Every day | ORAL | 5 refills | Status: DC
  Filled 2020-11-09: qty 30, 30d supply, fill #0
  Filled 2020-12-11: qty 30, 30d supply, fill #1

## 2020-11-09 NOTE — Patient Instructions (Signed)
It was a pleasure seeing you today!  MEDICATIONS: -We are changing your medications today -Increase spironolactone to 1 tablet (25 mg) ONCE daily -Call if you have questions about your medications.  LABS: -We will call you if your labs need attention.  NEXT APPOINTMENT: Return to clinic in 1 month with HF NP/PA.  In general, to take care of your heart failure: -Limit your fluid intake to 2 Liters (half-gallon) per day.   -Limit your salt intake to ideally 2-3 grams (2000-3000 mg) per day. -Weigh yourself daily and record, and bring that "weight diary" to your next appointment.  (Weight gain of 2-3 pounds in 1 day typically means fluid weight.) -The medications for your heart are to help your heart and help you live longer.   -Please contact us before stopping any of your heart medications.  Call the clinic at [phone] with questions or to reschedule future appointments.

## 2020-11-09 NOTE — Progress Notes (Signed)
PCP: Dr. [name] Primary Cardiologist: Dr. Shirlee Latch  HPI:  35 yo male with diabetes, HTN, and a long history of nonischemic cardiomyopathy. He had a cardiac MRI in 1/08 showing low EF, but he says that he had been told about "heart problems" even prior to that.  He does not have a family history of cardiomyopathy that he knows of, but does not know his father's family.  Coronary CT angiogram in 2014 showed no CAD. Echo in 2/17 showed EF 20-25%, similar to 2014.  He has generally had sporadic followup over the last few years, but has been seeing Dr Eden Emms recently. He was seen in the [facility] in 2017 but no f/u since. Last f/u w/ Cardiology was in 2020. Had repeat echo in 2020 showing LVEF 15-20%. RV normal. Most recently, he was diagnosed w/ COVID in April.   He presented to [facility] ED 09/11/20 with tingling in left arm, left face, drooling and blank stare for about 1 week, and admitted for possible seizure, HHS and possible CVA.  In ED, he had left-sided neurologic deficit with dysarthria.  CT head with probable subacute infarct in right frontal lobe and old right parietal infarct.  Reportedly, patient's neurologic deficits resolved in ED, and focus shifted to possible seizure versus stroke.  He was loaded with Keppra.   MRI brain with probable hemorrhagic infarct involving the anterior right frontal lobe and left parietal occipital cortex with additional chronic right parietal infarct, right frontotemporal region enhancement suggesting changes related to acute seizure, possible toxic insult involving the caudate and lentiform nuclei bilaterally.  Neurology recommended interval follow-up brain MRI.  CTA head and neck without significant finding.   TTE with EF of 10-15%, LV thrombus, moderate RV dysfunction and dilated IVC. He was discharged on GDMT, but A1c too high for SGLT2i.     Seen in [facility] ED 09/21/20 for fall after syncopal episode on toilet, hitting his head. CT of the head was significant for no  bleeding but new low-density left cerebellum suggesting acute infarct. EEG unremarkable and felt fall secondary to vagal episode not seizure.   He returned 6/22 for post hospital HF follow up. Overall feeling fine but was not exerting himself physically. Denied increasing SOB, CP, dizziness, edema, or PND/Orthopnea.    On 10/30/20, he returned for HF follow up. Overall felt poorly. For 1-2 weeks he had spontaneously occurring chest pain, mid sternal, that spontaneously resolves. No radiation or associated diaphoresis. Unable to quantify length of episodes. Some tingling in left arm when he reaches across his body. Worsening dyspnea with lifting/pushing. Overall feeling sluggish. Denied increasing dizziness, edema, or PND/Orthopnea. Appetite ok. No fever or chills. Weight at home 157 pounds. Taking all medications. No ETOH or drugs. Sent for [facility] to rule out ischemia. Found to have normal filling pressures, preserved cardiac output, and no significant CAD.   Today he returns to HF clinic for pharmacist medication titration. At last visit with MD on 10/30/20, losartan was discontinued and Entresto 24-26 mg BID was initiated. Overall, he is feeling much better. Denies lightheadedness, fatigue, palpitations, PND, or orthopnea. Endoreses some chest tightness after cath. Occasionally has dizzy spells that can happen at any time of day. Able to complete ADLs but does not do strenuous activity. Weight stable at home. Appetite is good.  HF Medications: Metoprolol XL 200 mg daily Entresto 24/26 mg BID Spironolactone 12.5 mg daily Digoxin 0.125 mg daily Ivabradine 7.5 mg BID Furosemide 20 mg daily  Has the patient been experiencing any  side effects to the medications prescribed? No  Does the patient have any problems obtaining medications due to transportation or finances? Yes - uninsured. Uses HF fund for medications; pending Medicaid. Appreciate assistance from CSW.  Understanding of regimen:  excellent Understanding of indications: excellent Potential of compliance: excellent Patient understands to avoid NSAIDs. Patient understands to avoid decongestants.    Pertinent Lab Values: Serum creatinine 0.89, BUN 16, Potassium 4.1, Sodium 139, 10/30/20: Digoxin 0.7   Vital Signs: Weight: 159 lbs (last clinic weight: 157) Blood pressure: 117/81 mmHg Heart rate: 84 bpm  Assessment/Plan: 1. Acute chest pain: Atypical CP x 1-2 weeks. Unlikely PE as he is on Coumadin. Coronary CT angiogram in 2014 showed no CAD. - R/LHC on 7/21 without significant CAD. 2. Chronic systolic CHF: Nonischemic cardiomyopathy known for years.  Normal coronary CTA in 2014.  Echo this admission with EF 10-15%, LV thrombus, moderate RV dysfunction, dilated IVC. R/LHC on 7/21 without significant CAD and normal filling pressures.  - NYHA II.  - Continue metoprolol XL 200 mg daily - Continue Entresto 24/26 mg BID - Increase spironolactone to 25 mg daily. BMET today and repeat in 1 week. - Continue digoxin 0.125. last digoxin level ok 0.7 - Continue Ivabradine 7.5 mg BID - Continue furosemide 20 mg daily - Hgb A1c too high for SGLT2i (>15.5). - Continue with Paramedicine. 3. LV thrombus: With associated CVA. - On Coumadin. Has been subtherapeutic.  4. 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  Still with some dysarthria. - Anticoagulation ok per neurology. - Atorvastatin. 5. 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  Now on Keppra. 6.  Hyperlipidemia: Markedly high LDL.   - Continue atorvastatin 80 mg daily + Zetia. - Lipid Clinic referral if LDL not at goal after improved compliance. 7.  Diabetes: Poor control. Hgb A1c >15.5. - On Novolog 70/30, glipizide, and metformin. - Not candidate for SGLT2i.    Follow up in 1 week with lab, and 1 month with HF NP/PA  Asencion Gowda PharmD/MBA Candidate [address] Class of 2023  Sharen Hones, PharmD,  BCPS Advanced Heart Failure Clinic Pharmacist Phone [phone]

## 2020-11-09 NOTE — Progress Notes (Signed)
CSW spoke with [name] at his pharmacy visit to follow up with social needs. [name] was unsure about questions the CSW was asking and called his wife Jason Stevenson and put her on speaker phone. Jason Stevenson reported that all of the applications are pending for Food Stamps, Medicaid, and Disability and they have filled out paperwork and sent it back and they are awaiting a response. They are making payments on their electric bill which is a total of $1596.16 and the next payment due is $308.04. Both the patient and his wife report none of their bills have a shut off notice but are aware to call if this happens. [name] reported that they only have one vehicle to use and his wife takes that to work so he cannot get to the grocery store or run errands while she is gone. [name] is using [facility] to get him to and from [facility] related appointments. CSW informed him that if his Medicaid is approved that transportation through [place] may be an option but he will need to wait on the status of that application. [name] is getting notifications about his hospital bills however CSW informed him to wait until he hears back from [facility] first as they will back pay if he is approved. CSW provided [name] with the Advanced HF clinic's phone number and the HF outpatient social worker's number and to call if other social needs arise.  Suraj Ramdass, MSW, LCSWA [phone] Heart Failure Social Worker

## 2020-11-13 ENCOUNTER — Other Ambulatory Visit (HOSPITAL_COMMUNITY): Payer: Self-pay

## 2020-11-13 ENCOUNTER — Other Ambulatory Visit: Payer: Self-pay

## 2020-11-13 ENCOUNTER — Ambulatory Visit (INDEPENDENT_AMBULATORY_CARE_PROVIDER_SITE_OTHER): Payer: Medicaid Other

## 2020-11-13 DIAGNOSIS — Z7901 Long term (current) use of anticoagulants: Secondary | ICD-10-CM | POA: Diagnosis not present

## 2020-11-13 DIAGNOSIS — I513 Intracardiac thrombosis, not elsewhere classified: Secondary | ICD-10-CM | POA: Diagnosis not present

## 2020-11-13 DIAGNOSIS — I639 Cerebral infarction, unspecified: Secondary | ICD-10-CM | POA: Diagnosis not present

## 2020-11-13 DIAGNOSIS — Z5181 Encounter for therapeutic drug level monitoring: Secondary | ICD-10-CM | POA: Diagnosis not present

## 2020-11-13 LAB — POCT INR: INR: 2.8 (ref 2.0–3.0)

## 2020-11-13 MED ORDER — WARFARIN SODIUM 5 MG PO TABS
7.5000 mg | ORAL_TABLET | Freq: Every day | ORAL | 1 refills | Status: DC
  Filled 2020-11-13: qty 45, 30d supply, fill #0
  Filled 2020-12-19: qty 45, 30d supply, fill #1

## 2020-11-13 MED ORDER — WARFARIN SODIUM 5 MG PO TABS
7.5000 mg | ORAL_TABLET | Freq: Every day | ORAL | 1 refills | Status: DC
  Filled 2020-11-13: qty 45, 30d supply, fill #0

## 2020-11-13 NOTE — Progress Notes (Signed)
Paramedicine Encounter    Patient ID: Jason Stevenson, male    DOB: Dec 30, 1985, 35 y.o.   MRN: [id number]   Patient Care Team: Ladell Pier, MD as PCP - General (Internal Medicine) Josue Hector, MD as PCP - Cardiology (Cardiology)  Patient Active Problem List   Diagnosis Date Noted   Chest pain 10/30/2020   History of cerebrovascular accident (CVA) with residual deficit 10/27/2020   Protein-calorie malnutrition, severe 09/22/2020   Acute CVA (cerebrovascular accident) ([place]) 09/21/2020   Near syncope 09/21/2020   Encounter for monitoring Coumadin therapy 09/20/2020   LV (left ventricular) mural thrombus 09/13/2020   Seizure ([place]) 09/13/2020   Neurological deficit present 09/12/2020   History of COVID-19 09/12/2020   Intracerebral hemorrhage 09/12/2020   Acute cerebrovascular accident (CVA) ([place]) 09/12/2020   Hyperosmolar hyperglycemic state (HHS) ([place]) 09/11/2020   COVID-19 08/23/2020   Noncompliance with medication treatment due to intermittent use of medication 10/12/2018   Dyslipidemia 04/17/2016   Uncontrolled type 2 diabetes mellitus without complication, with long-term current use of insulin 53/29/9242   Chronic systolic CHF (congestive heart failure) ([place]) 06/18/2015   Needs flu shot 12/23/2013   Type 2 diabetes mellitus ([place]) 11/18/2013   Diabetes ([place]) 06/17/2013   Non-ischemic cardiomyopathy ([place]) 11/06/2012   HTN (hypertension) 11/06/2012   HLD (hyperlipidemia) 11/06/2012    Current Outpatient Medications:    acetaminophen (TYLENOL) 500 MG tablet, Take 1,000 mg by mouth at bedtime., Disp: , Rfl:    atorvastatin (LIPITOR) 80 MG tablet, Take 1 tablet (80 mg total) by mouth daily., Disp: 30 tablet, Rfl: 2   Blood Glucose Monitoring Suppl (TRUE METRIX METER) w/Device KIT, 1 each by Does not apply route 3 (three) times daily., Disp: 1 kit, Rfl: 0   Blood Pressure Monitor DEVI, Use as directed to check home blood pressure 2-3 times a week, Disp: 1 Device, Rfl:  0   digoxin (LANOXIN) 0.125 MG tablet, Take 1 tablet (0.125 mg total) by mouth daily., Disp: 30 tablet, Rfl: 2   enoxaparin (LOVENOX) 100 MG/ML injection, Inject 1 syringe into the skin daily for 5 days., Disp: 5 mL, Rfl: 0   enoxaparin (LOVENOX) 80 MG/0.8ML injection, Inject 80 mg into the skin daily., Disp: , Rfl:    ezetimibe (ZETIA) 10 MG tablet, Take 1 tablet (10 mg total) by mouth daily., Disp: 30 tablet, Rfl: 2   furosemide (LASIX) 20 MG tablet, Take 1 tablet (20 mg total) by mouth daily., Disp: 30 tablet, Rfl: 2   glucose blood test strip, Use as instructed, Disp: 100 each, Rfl: 12   insulin isophane & regular human (NOVOLIN 70/30 FLEXPEN) (70-30) 100 UNIT/ML KwikPen, 22 units SQ Q a.m and 18 units Q p.m, Disp: 13.2 mL, Rfl: 5   Insulin Pen Needle (PEN NEEDLES) 31G X 8 MM MISC, UAD, Disp: 100 each, Rfl: 6   Insulin Pen Needle 32G X 4 MM MISC, Use in the morning, at noon, and at bedtime., Disp: 100 each, Rfl: 0   Insulin Syringe-Needle U-100 (RELION INSULIN SYRINGE) 31G X 15/64" 0.3 ML MISC, Use to inject insulin daily., Disp: 100 each, Rfl: 11   ivabradine (CORLANOR) 7.5 MG TABS tablet, Take 1 tablet (7.5 mg total) by mouth 2 (two) times daily with a meal., Disp: 60 tablet, Rfl:    levETIRAcetam (KEPPRA) 750 MG tablet, Take 2 tablets (1,500 mg total) by mouth 2 (two) times daily., Disp: 120 tablet, Rfl: 2   metFORMIN (GLUCOPHAGE) 500 MG tablet, Take 1 tablet (500 mg total)  by mouth 2 (two) times daily with a meal., Disp: 60 tablet, Rfl: 4   metoprolol (TOPROL-XL) 200 MG 24 hr tablet, Take 1 tablet (200 mg total) by mouth daily. Take with or immediately following a meal., Disp: 30 tablet, Rfl: 2   Oxcarbazepine (TRILEPTAL) 300 MG tablet, Take 2 tablets (600 mg total) by mouth 2 (two) times daily., Disp: 120 tablet, Rfl: 2   phenytoin (DILANTIN) 100 MG ER capsule, Take 1 capsule (100 mg total) by mouth 3 (three) times daily., Disp: 90 capsule, Rfl: 2   pregabalin (LYRICA) 25 MG capsule, Take 1  capsule (25 mg total) by mouth 2 (two) times daily., Disp: 60 capsule, Rfl: 3   sacubitril-valsartan (ENTRESTO) 24-26 MG, Take 1 tablet by mouth 2 (two) times daily., Disp: 60 tablet, Rfl: 11   sildenafil (VIAGRA) 50 MG tablet, TAKE 1 TABLET BY MOUTH 1/2-1 HOUR PRIOR TO SEX AS NEEDED. MAX 1 TABLET PER 24 HOURS (Patient not taking: Reported on 11/09/2020), Disp: 10 tablet, Rfl: 4   spironolactone (ALDACTONE) 25 MG tablet, Take 1 tablet (25 mg total) by mouth daily., Disp: 30 tablet, Rfl: 5   warfarin (COUMADIN) 5 MG tablet, Take 5 mg by mouth daily., Disp: , Rfl:  No Known Allergies   Social History   Socioeconomic History   Marital status: Married    Spouse name: Not on file   Number of children: Not on file   Years of education: Not on file   Highest education level: Not on file  Occupational History   Occupation: unemployed  Tobacco Use   Smoking status: Never   Smokeless tobacco: Never  Vaping Use   Vaping Use: Not on file  Substance and Sexual Activity   Alcohol use: Not Currently    Comment: "occasional" when "hanging out with the wrong people" No recent use.   Drug use: Not Currently    Types: Marijuana    Comment: occasional, last 2013   Sexual activity: Not Currently  Other Topics Concern   Not on file  Social History Narrative   Pt lives alone. Has no information on father.   Social Determinants of Health   Financial Resource Strain: High Risk   Difficulty of Paying Living Expenses: Hard  Food Insecurity: No Food Insecurity   Worried About Charity fundraiser in the Last Year: Never true   Ran Out of Food in the Last Year: Never true  Transportation Needs: Unmet Transportation Needs   Lack of Transportation (Medical): Yes   Lack of Transportation (Non-Medical): Yes  Physical Activity: Not on file  Stress: Not on file  Social Connections: Not on file  Intimate Partner Violence: Not on file    Physical Exam Vitals reviewed.  Constitutional:      Appearance:  Normal appearance. He is normal weight.  HENT:     Head: Normocephalic.     Nose: Nose normal.     Mouth/Throat:     Mouth: Mucous membranes are moist.     Pharynx: Oropharynx is clear.  Eyes:     Conjunctiva/sclera: Conjunctivae normal.     Pupils: Pupils are equal, round, and reactive to light.  Cardiovascular:     Rate and Rhythm: Normal rate and regular rhythm.     Pulses: Normal pulses.     Heart sounds: Normal heart sounds.  Pulmonary:     Effort: Pulmonary effort is normal.     Breath sounds: Normal breath sounds.  Abdominal:     General: Abdomen is  flat.     Palpations: Abdomen is soft.  Musculoskeletal:        General: No swelling. Normal range of motion.     Cervical back: Normal range of motion.     Right lower leg: No edema.     Left lower leg: No edema.  Skin:    General: Skin is warm and dry.     Capillary Refill: Capillary refill takes less than 2 seconds.  Neurological:     General: No focal deficit present.     Mental Status: He is alert. Mental status is at baseline.  Psychiatric:        Mood and Affect: Mood normal.   Arrived for home visit for Ethel who was alert and oriented reporting to be feeling fine just tired. Jason Stevenson stated that he has been compliant with his medications over the last week with no reports of any issues. He denied shortness of breath, dizziness or chest pain. I reviewed notes, and medications and filled one pill box accordingly. Vitals and assessment as noted in report.   Meds needed: Coumadin- everyday in the evening Keppra- sat eve, sun morn and eve, mon morn Trilleptal- mon Chief of Staff- weds eve, thurs-sat morn and eve (no samples at HF clinic as of 8/1)   I will follow up on [employer] application.    - I will message [place] reference Medicaid application  - Needs new scale, will bring out next week.   Refills:  Future Appointments  Date Time Provider [place]  11/16/2020 12:45 PM [facility] LAB [facility] None   11/27/2020  9:15 AM [facility]  12/04/2020 10:00 AM [facility] PA/NP [facility] None  12/20/2020  9:00 AM Penumalli, Earlean Polka, MD [facility] None     ACTION: Home visit completed

## 2020-11-13 NOTE — Telephone Encounter (Signed)
Advanced Heart Failure Patient Advocate Encounter  Called [employer] to check the status of the patient's application. Representative stated that the patient also had a pending application for [place] assistance. I see Margaretmary Dys [facility]) started that application and resent in the missing information. Provided answers to missing questions.  Stated that they would separate the patient's two assistance applications. He would have to provide POI for both and I would have to refax the demographic information from the [place] application.  Sent Heather (EMT), a message regarding proof of income. She will relay to the patient.  Will send in POI once received.

## 2020-11-13 NOTE — Patient Instructions (Signed)
Description   Continue taking 1 tablet daily except for 1.5 tablets on Tuesdays, Thursdays, and Saturdays. Recheck in 2 weeks.  -Coumadin Clinic [phone].

## 2020-11-14 ENCOUNTER — Other Ambulatory Visit: Payer: Self-pay

## 2020-11-14 ENCOUNTER — Ambulatory Visit: Payer: Medicaid Other | Admitting: Physician Assistant

## 2020-11-15 ENCOUNTER — Telehealth: Payer: Self-pay | Admitting: Licensed Clinical Social Worker

## 2020-11-15 NOTE — Telephone Encounter (Addendum)
LCSW received referral for pt regarding Medicaid.  Pt shared with paramedic that he had received a piece of paper for his Medicaid but it had been misplaced and he is inquiring on the status. [employer] had initially assisted pt with applying but application would be processed by DSS. Pt should call DSS directly to ensure that he did not need to complete anything on misplaced paper and to f/u on status. DSS number for [facility] is [phone] and address is [address], [place], [place], [address].   I attempted to reach pt via telephone today at [phone]. No answer, message left requesting call back. Remain available as needed for any additional questions/concerns.  Octavio Graves, MSW, LCSW [facility] Heart/Vascular Care Navigation  [phone]

## 2020-11-15 NOTE — Telephone Encounter (Signed)
I have faxed over his demographic info. Will await his income info.

## 2020-11-16 ENCOUNTER — Other Ambulatory Visit: Payer: Self-pay

## 2020-11-16 ENCOUNTER — Encounter: Payer: Self-pay | Admitting: Internal Medicine

## 2020-11-16 ENCOUNTER — Telehealth: Payer: Self-pay | Admitting: Licensed Clinical Social Worker

## 2020-11-16 ENCOUNTER — Ambulatory Visit (HOSPITAL_COMMUNITY)
Admission: RE | Admit: 2020-11-16 | Discharge: 2020-11-16 | Disposition: A | Discharge status: Home / Self Care | Payer: Medicaid Other | Source: Ambulatory Visit | Attending: Internal Medicine | Admitting: Internal Medicine

## 2020-11-16 DIAGNOSIS — I5022 Chronic systolic (congestive) heart failure: Secondary | ICD-10-CM | POA: Insufficient documentation

## 2020-11-16 DIAGNOSIS — E1142 Type 2 diabetes mellitus with diabetic polyneuropathy: Secondary | ICD-10-CM

## 2020-11-16 DIAGNOSIS — Z794 Long term (current) use of insulin: Secondary | ICD-10-CM

## 2020-11-16 LAB — BASIC METABOLIC PANEL
Anion gap: 9 (ref 5–15)
BUN: 6 mg/dL (ref 6–20)
CO2: 31 mmol/L (ref 22–32)
Calcium: 9.3 mg/dL (ref 8.9–10.3)
Chloride: 99 mmol/L (ref 98–111)
Creatinine, Ser: 1.01 mg/dL (ref 0.61–1.24)
GFR, Estimated: >60 mL/min (ref >60)
Glucose, Bld: 227 mg/dL — ABNORMAL HIGH (ref 70–99)
Potassium: 4.1 mmol/L (ref 3.5–5.1)
Sodium: 139 mmol/L (ref 135–145)

## 2020-11-16 LAB — PROTIME-INR
INR: 2.6 — ABNORMAL HIGH (ref 0.8–1.2)
Prothrombin Time: 27.4 seconds — ABNORMAL HIGH (ref 11.4–15.2)

## 2020-11-16 NOTE — Telephone Encounter (Signed)
LCSW able to reach pt via telephone. Introduced self, role, reason for call. Pt states he thinks that the letter noted that he was still eligible for [facility] only but he is not sure. LCSW encouraged pt to reach out directly to DSS. That way he can determine if he still has a pending Medicaid application. If he was again denied for Medicaid and given family planning only then he may be eligible for [employer]. Pt confirmed his phone number and that he can receive text messages. LCSW sent pt a text message with DSS number [phone]) and confirmed that we could also coordinate a ride for pt to get to DSS if preferred. Pt states he will call first and then let one of the LCSW team know if he needs to go in person.   Octavio Graves, MSW, LCSW [facility] Heart/Vascular Care Navigation  [phone]

## 2020-11-17 ENCOUNTER — Telehealth (HOSPITAL_COMMUNITY): Payer: Self-pay | Admitting: Licensed Clinical Social Worker

## 2020-11-17 MED ORDER — PREGABALIN 50 MG PO CAPS
50.0000 mg | ORAL_CAPSULE | Freq: Two times a day (BID) | ORAL | 1 refills | Status: DC
  Filled 2020-11-17: qty 60, 30d supply, fill #0

## 2020-11-17 NOTE — Telephone Encounter (Signed)
CSW attempted to contact patient to discuss needs and complete [facility] screen. Patient also in need of scale and wanted to confirm pick up. Message left. Lasandra Beech, LCSW, CCSW-MCS [phone]

## 2020-11-20 ENCOUNTER — Other Ambulatory Visit (HOSPITAL_COMMUNITY): Payer: Self-pay

## 2020-11-20 ENCOUNTER — Other Ambulatory Visit: Payer: Self-pay

## 2020-11-20 NOTE — Progress Notes (Signed)
Paramedicine Encounter    Patient ID: Jason Stevenson, male    DOB: Sep 01, 1985, 35 y.o.   MRN: [id number]   Patient Care Team: Ladell Pier, MD as PCP - General (Internal Medicine) Josue Hector, MD as PCP - Cardiology (Cardiology)  Patient Active Problem List   Diagnosis Date Noted   Chest pain 10/30/2020   History of cerebrovascular accident (CVA) with residual deficit 10/27/2020   Protein-calorie malnutrition, severe 09/22/2020   Acute CVA (cerebrovascular accident) ([place]) 09/21/2020   Near syncope 09/21/2020   Encounter for monitoring Coumadin therapy 09/20/2020   LV (left ventricular) mural thrombus 09/13/2020   Seizure ([place]) 09/13/2020   Neurological deficit present 09/12/2020   History of COVID-19 09/12/2020   Intracerebral hemorrhage 09/12/2020   Acute cerebrovascular accident (CVA) ([place]) 09/12/2020   Hyperosmolar hyperglycemic state (HHS) ([place]) 09/11/2020   COVID-19 08/23/2020   Noncompliance with medication treatment due to intermittent use of medication 10/12/2018   Dyslipidemia 04/17/2016   Uncontrolled type 2 diabetes mellitus without complication, with long-term current use of insulin 42/59/5638   Chronic systolic CHF (congestive heart failure) ([place]) 06/18/2015   Needs flu shot 12/23/2013   Type 2 diabetes mellitus ([place]) 11/18/2013   Diabetes ([place]) 06/17/2013   Non-ischemic cardiomyopathy ([place]) 11/06/2012   HTN (hypertension) 11/06/2012   HLD (hyperlipidemia) 11/06/2012    Current Outpatient Medications:    acetaminophen (TYLENOL) 500 MG tablet, Take 1,000 mg by mouth at bedtime., Disp: , Rfl:    atorvastatin (LIPITOR) 80 MG tablet, Take 1 tablet (80 mg total) by mouth daily., Disp: 30 tablet, Rfl: 2   Blood Glucose Monitoring Suppl (TRUE METRIX METER) w/Device KIT, 1 each by Does not apply route 3 (three) times daily., Disp: 1 kit, Rfl: 0   Blood Pressure Monitor DEVI, Use as directed to check home blood pressure 2-3 times a week, Disp: 1 Device, Rfl:  0   digoxin (LANOXIN) 0.125 MG tablet, Take 1 tablet (0.125 mg total) by mouth daily., Disp: 30 tablet, Rfl: 2   enoxaparin (LOVENOX) 100 MG/ML injection, Inject 1 syringe into the skin daily for 5 days., Disp: 5 mL, Rfl: 0   enoxaparin (LOVENOX) 80 MG/0.8ML injection, Inject 80 mg into the skin daily. (Patient not taking: Reported on 11/13/2020), Disp: , Rfl:    ezetimibe (ZETIA) 10 MG tablet, Take 1 tablet (10 mg total) by mouth daily., Disp: 30 tablet, Rfl: 2   furosemide (LASIX) 20 MG tablet, Take 1 tablet (20 mg total) by mouth daily., Disp: 30 tablet, Rfl: 2   glucose blood test strip, Use as instructed, Disp: 100 each, Rfl: 12   insulin isophane & regular human (NOVOLIN 70/30 FLEXPEN) (70-30) 100 UNIT/ML KwikPen, 22 units SQ Q a.m and 18 units Q p.m, Disp: 13.2 mL, Rfl: 5   Insulin Pen Needle (PEN NEEDLES) 31G X 8 MM MISC, UAD, Disp: 100 each, Rfl: 6   Insulin Pen Needle 32G X 4 MM MISC, Use in the morning, at noon, and at bedtime., Disp: 100 each, Rfl: 0   Insulin Syringe-Needle U-100 (RELION INSULIN SYRINGE) 31G X 15/64" 0.3 ML MISC, Use to inject insulin daily., Disp: 100 each, Rfl: 11   ivabradine (CORLANOR) 7.5 MG TABS tablet, Take 1 tablet (7.5 mg total) by mouth 2 (two) times daily with a meal., Disp: 60 tablet, Rfl:    levETIRAcetam (KEPPRA) 750 MG tablet, Take 2 tablets (1,500 mg total) by mouth 2 (two) times daily., Disp: 120 tablet, Rfl: 2   metFORMIN (GLUCOPHAGE) 500 MG tablet,  Take 1 tablet (500 mg total) by mouth 2 (two) times daily with a meal., Disp: 60 tablet, Rfl: 4   metoprolol (TOPROL-XL) 200 MG 24 hr tablet, Take 1 tablet (200 mg total) by mouth daily. Take with or immediately following a meal., Disp: 30 tablet, Rfl: 2   Oxcarbazepine (TRILEPTAL) 300 MG tablet, Take 2 tablets (600 mg total) by mouth 2 (two) times daily., Disp: 120 tablet, Rfl: 2   phenytoin (DILANTIN) 100 MG ER capsule, Take 1 capsule (100 mg total) by mouth 3 (three) times daily., Disp: 90 capsule, Rfl: 2    pregabalin (LYRICA) 50 MG capsule, Take 1 capsule (50 mg total) by mouth 2 (two) times daily., Disp: 60 capsule, Rfl: 1   sacubitril-valsartan (ENTRESTO) 24-26 MG, Take 1 tablet by mouth 2 (two) times daily., Disp: 60 tablet, Rfl: 11   sildenafil (VIAGRA) 50 MG tablet, TAKE 1 TABLET BY MOUTH 1/2-1 HOUR PRIOR TO SEX AS NEEDED. MAX 1 TABLET PER 24 HOURS (Patient not taking: No sig reported), Disp: 10 tablet, Rfl: 4   spironolactone (ALDACTONE) 25 MG tablet, Take 1 tablet (25 mg total) by mouth daily., Disp: 30 tablet, Rfl: 5   warfarin (COUMADIN) 5 MG tablet, Take 1.5 tablets (7.5 mg total) by mouth daily., Disp: 45 tablet, Rfl: 1 No Known Allergies   Social History   Socioeconomic History   Marital status: Married    Spouse name: Not on file   Number of children: Not on file   Years of education: Not on file   Highest education level: Not on file  Occupational History   Occupation: unemployed  Tobacco Use   Smoking status: Never   Smokeless tobacco: Never  Vaping Use   Vaping Use: Not on file  Substance and Sexual Activity   Alcohol use: Not Currently    Comment: "occasional" when "hanging out with the wrong people" No recent use.   Drug use: Not Currently    Types: Marijuana    Comment: occasional, last 2013   Sexual activity: Not Currently  Other Topics Concern   Not on file  Social History Narrative   Pt lives alone. Has no information on father.   Social Determinants of Health   Financial Resource Strain: High Risk   Difficulty of Paying Living Expenses: Hard  Food Insecurity: No Food Insecurity   Worried About Charity fundraiser in the Last Year: Never true   Ran Out of Food in the Last Year: Never true  Transportation Needs: Unmet Transportation Needs   Lack of Transportation (Medical): Yes   Lack of Transportation (Non-Medical): Yes  Physical Activity: Not on file  Stress: Not on file  Social Connections: Not on file  Intimate Partner Violence: Not on file     Physical Exam Vitals reviewed.  Constitutional:      Appearance: Normal appearance. He is normal weight.  HENT:     Head: Normocephalic.     Nose: Nose normal.     Mouth/Throat:     Mouth: Mucous membranes are moist.     Pharynx: Oropharynx is clear.  Eyes:     Conjunctiva/sclera: Conjunctivae normal.     Pupils: Pupils are equal, round, and reactive to light.  Cardiovascular:     Rate and Rhythm: Normal rate and regular rhythm.     Pulses: Normal pulses.     Heart sounds: Normal heart sounds.  Pulmonary:     Effort: Pulmonary effort is normal.     Breath sounds: Normal breath  sounds.  Abdominal:     General: Abdomen is flat.     Palpations: Abdomen is soft.  Musculoskeletal:        General: No swelling. Normal range of motion.     Cervical back: Normal range of motion.     Right lower leg: No edema.     Left lower leg: No edema.     Comments: Bilateral hand and feet pain   Skin:    General: Skin is warm and dry.     Capillary Refill: Capillary refill takes less than 2 seconds.  Neurological:     General: No focal deficit present.     Mental Status: He is alert. Mental status is at baseline.  Psychiatric:        Mood and Affect: Mood normal.    Arrived for home visit for Marston who reports he is doing okay just tired due to not getting sleep because of his hand and feet pain. He describes the pain as pins and needles with an ache. He was given an increase in his Pregabalin per Dr. Wynetta Emery and has started same today, we will see how this helps. Vitals were obtained and recorded. Assessment as noted. Pranshu denied chest pain, shortness of breath but did report a few episodes of dizziness lately. He reports he has only been drinking one to one and a half bottles of water a day. I advised him to increase his fluid intake but keep it under 2L. He agreed with plan.   CBG was 88 at 0800 before any meds or meal.   Kazimierz reports he spoke to DSS and his medicaid application is  still pending but they have everything they need currently. We will continue to follow up.   New scale from HF clinic was delivered by me today with weight at 151lbs.   Home visit complete. I will see Randie in one week.   Meds to be picked up: -Metformin -Dilantin -[place] picked up same today at [facility].      Future Appointments  Date Time Provider [place]  11/27/2020  9:15 AM [facility]  12/04/2020 10:00 AM [facility] PA/NP [facility] None  12/20/2020  9:00 AM Penumalli, Earlean Polka, MD [facility] None     ACTION: Home visit completed

## 2020-11-21 ENCOUNTER — Other Ambulatory Visit: Payer: Self-pay

## 2020-11-21 ENCOUNTER — Other Ambulatory Visit: Payer: Self-pay | Admitting: Internal Medicine

## 2020-11-21 ENCOUNTER — Encounter: Payer: Self-pay | Admitting: Internal Medicine

## 2020-11-21 MED ORDER — TRUEPLUS 5-BEVEL PEN NEEDLES 32G X 4 MM MISC
100.0000 | Freq: Three times a day (TID) | 2 refills | Status: DC
  Filled 2020-11-21: qty 100, 30d supply, fill #0

## 2020-11-21 NOTE — Telephone Encounter (Signed)
  Notes to clinic:  script filled by a different provider  Review for continue use and refill    Requested Prescriptions  Pending Prescriptions Disp Refills   Insulin Pen Needle (PENTIPS) 32G X 4 MM MISC 100 each 0    Sig: Use in the morning, at noon, and at bedtime.      Endocrinology: Diabetes - Testing Supplies Passed - 11/21/2020  9:52 AM      Passed - Valid encounter within last 12 months    Recent Outpatient Visits           3 weeks ago Type 2 diabetes mellitus with diabetic polyneuropathy, with long-term current use of insulin (HCC)   [place] [facility] [place], Gavin Pound B, MD   12 months ago Type 2 diabetes mellitus with diabetic polyneuropathy, with long-term current use of insulin (HCC)   [place] [facility] [name] B, MD   1 year ago Type 2 diabetes mellitus with hyperglycemia, with long-term current use of insulin [facility])   [place] [facility] [place], Cornelius Moras, RPH-CPP   1 year ago Type 2 diabetes mellitus with hyperglycemia, with long-term current use of insulin [facility])   [place] [facility] Marcine Matar, MD   1 year ago Encounter for medication counseling   [facility] [place] [facility] Drucilla Chalet, RPH-CPP

## 2020-11-22 ENCOUNTER — Other Ambulatory Visit: Payer: Self-pay

## 2020-11-24 ENCOUNTER — Telehealth (HOSPITAL_COMMUNITY): Payer: Self-pay | Admitting: Pharmacy Technician

## 2020-11-24 NOTE — Telephone Encounter (Signed)
Advanced Heart Failure Patient Advocate Encounter  Provided 4 bottles of Corlanor samples for the patient.   LOT [id number] EXP 08/26  Beckie Salts (EMT) a message that they will be ready for her to pick up when convenient. Reminded her that the patient will need to POI in order to receive more samples. That is the only thing holding up the assistance process.   Archer Asa, CPhT

## 2020-11-27 ENCOUNTER — Other Ambulatory Visit: Payer: Self-pay

## 2020-11-27 ENCOUNTER — Ambulatory Visit (INDEPENDENT_AMBULATORY_CARE_PROVIDER_SITE_OTHER): Payer: Medicaid Other

## 2020-11-27 ENCOUNTER — Other Ambulatory Visit (HOSPITAL_COMMUNITY): Payer: Self-pay

## 2020-11-27 DIAGNOSIS — I513 Intracardiac thrombosis, not elsewhere classified: Secondary | ICD-10-CM | POA: Diagnosis not present

## 2020-11-27 DIAGNOSIS — Z5181 Encounter for therapeutic drug level monitoring: Secondary | ICD-10-CM

## 2020-11-27 DIAGNOSIS — Z7901 Long term (current) use of anticoagulants: Secondary | ICD-10-CM

## 2020-11-27 DIAGNOSIS — I639 Cerebral infarction, unspecified: Secondary | ICD-10-CM | POA: Diagnosis not present

## 2020-11-27 LAB — POCT INR: INR: 2.6 (ref 2.0–3.0)

## 2020-11-27 NOTE — Patient Instructions (Signed)
Continue taking 1 tablet daily except for 1.5 tablets on Tuesdays, Thursdays, and Saturdays. Recheck in 3 weeks.  -Coumadin Clinic [phone].

## 2020-11-27 NOTE — Progress Notes (Signed)
Paramedicine Encounter    Patient ID: Jason Stevenson, male    DOB: 01-07-1986, 35 y.o.   MRN: [id number]   Patient Care Team: Ladell Pier, MD as PCP - General (Internal Medicine) Josue Hector, MD as PCP - Cardiology (Cardiology)  Patient Active Problem List   Diagnosis Date Noted   Chest pain 10/30/2020   History of cerebrovascular accident (CVA) with residual deficit 10/27/2020   Protein-calorie malnutrition, severe 09/22/2020   Acute CVA (cerebrovascular accident) ([place]) 09/21/2020   Near syncope 09/21/2020   Encounter for monitoring Coumadin therapy 09/20/2020   LV (left ventricular) mural thrombus 09/13/2020   Seizure ([place]) 09/13/2020   Neurological deficit present 09/12/2020   History of COVID-19 09/12/2020   Intracerebral hemorrhage 09/12/2020   Acute cerebrovascular accident (CVA) ([place]) 09/12/2020   Hyperosmolar hyperglycemic state (HHS) ([place]) 09/11/2020   COVID-19 08/23/2020   Noncompliance with medication treatment due to intermittent use of medication 10/12/2018   Dyslipidemia 04/17/2016   Uncontrolled type 2 diabetes mellitus without complication, with long-term current use of insulin 20/94/7096   Chronic systolic CHF (congestive heart failure) ([place]) 06/18/2015   Needs flu shot 12/23/2013   Type 2 diabetes mellitus ([place]) 11/18/2013   Diabetes ([place]) 06/17/2013   Non-ischemic cardiomyopathy ([place]) 11/06/2012   HTN (hypertension) 11/06/2012   HLD (hyperlipidemia) 11/06/2012    Current Outpatient Medications:    acetaminophen (TYLENOL) 500 MG tablet, Take 1,000 mg by mouth at bedtime., Disp: , Rfl:    atorvastatin (LIPITOR) 80 MG tablet, Take 1 tablet (80 mg total) by mouth daily., Disp: 30 tablet, Rfl: 2   Blood Glucose Monitoring Suppl (TRUE METRIX METER) w/Device KIT, 1 each by Does not apply route 3 (three) times daily., Disp: 1 kit, Rfl: 0   Blood Pressure Monitor DEVI, Use as directed to check home blood pressure 2-3 times a week, Disp: 1 Device, Rfl:  0   digoxin (LANOXIN) 0.125 MG tablet, Take 1 tablet (0.125 mg total) by mouth daily., Disp: 30 tablet, Rfl: 2   enoxaparin (LOVENOX) 100 MG/ML injection, Inject 1 syringe into the skin daily for 5 days., Disp: 5 mL, Rfl: 0   enoxaparin (LOVENOX) 80 MG/0.8ML injection, Inject 80 mg into the skin daily. (Patient not taking: No sig reported), Disp: , Rfl:    ezetimibe (ZETIA) 10 MG tablet, Take 1 tablet (10 mg total) by mouth daily., Disp: 30 tablet, Rfl: 2   furosemide (LASIX) 20 MG tablet, Take 1 tablet (20 mg total) by mouth daily., Disp: 30 tablet, Rfl: 2   glucose blood test strip, Use as instructed, Disp: 100 each, Rfl: 12   insulin isophane & regular human KwikPen (NOVOLIN 70/30 KWIKPEN) (70-30) 100 UNIT/ML KwikPen, 22 units SQ Q a.m and 18 units Q p.m, Disp: 13.2 mL, Rfl: 5   Insulin Pen Needle (TRUEPLUS 5-BEVEL PEN NEEDLES) 32G X 4 MM MISC, Use in the morning, at noon, and at bedtime., Disp: 100 each, Rfl: 2   Insulin Syringe-Needle U-100 (RELION INSULIN SYRINGE) 31G X 15/64" 0.3 ML MISC, Use to inject insulin daily., Disp: 100 each, Rfl: 11   ivabradine (CORLANOR) 7.5 MG TABS tablet, Take 1 tablet (7.5 mg total) by mouth 2 (two) times daily with a meal., Disp: 60 tablet, Rfl:    levETIRAcetam (KEPPRA) 750 MG tablet, Take 2 tablets (1,500 mg total) by mouth 2 (two) times daily., Disp: 120 tablet, Rfl: 2   metFORMIN (GLUCOPHAGE) 500 MG tablet, Take 1 tablet (500 mg total) by mouth 2 (two) times daily with  a meal., Disp: 60 tablet, Rfl: 4   metoprolol (TOPROL-XL) 200 MG 24 hr tablet, Take 1 tablet (200 mg total) by mouth daily. Take with or immediately following a meal., Disp: 30 tablet, Rfl: 2   Oxcarbazepine (TRILEPTAL) 300 MG tablet, Take 2 tablets (600 mg total) by mouth 2 (two) times daily., Disp: 120 tablet, Rfl: 2   phenytoin (DILANTIN) 100 MG ER capsule, Take 1 capsule (100 mg total) by mouth 3 (three) times daily., Disp: 90 capsule, Rfl: 2   pregabalin (LYRICA) 50 MG capsule, Take 1  capsule (50 mg total) by mouth 2 (two) times daily., Disp: 60 capsule, Rfl: 1   sacubitril-valsartan (ENTRESTO) 24-26 MG, Take 1 tablet by mouth 2 (two) times daily., Disp: 60 tablet, Rfl: 11   sildenafil (VIAGRA) 50 MG tablet, TAKE 1 TABLET BY MOUTH 1/2-1 HOUR PRIOR TO SEX AS NEEDED. MAX 1 TABLET PER 24 HOURS (Patient not taking: No sig reported), Disp: 10 tablet, Rfl: 4   spironolactone (ALDACTONE) 25 MG tablet, Take 1 tablet (25 mg total) by mouth daily., Disp: 30 tablet, Rfl: 5   warfarin (COUMADIN) 5 MG tablet, Take 1.5 tablets (7.5 mg total) by mouth daily., Disp: 45 tablet, Rfl: 1 No Known Allergies   Social History   Socioeconomic History   Marital status: Married    Spouse name: Not on file   Number of children: Not on file   Years of education: Not on file   Highest education level: Not on file  Occupational History   Occupation: unemployed  Tobacco Use   Smoking status: Never   Smokeless tobacco: Never  Vaping Use   Vaping Use: Not on file  Substance and Sexual Activity   Alcohol use: Not Currently    Comment: "occasional" when "hanging out with the wrong people" No recent use.   Drug use: Not Currently    Types: Marijuana    Comment: occasional, last 2013   Sexual activity: Not Currently  Other Topics Concern   Not on file  Social History Narrative   Pt lives alone. Has no information on father.   Social Determinants of Health   Financial Resource Strain: High Risk   Difficulty of Paying Living Expenses: Hard  Food Insecurity: No Food Insecurity   Worried About Charity fundraiser in the Last Year: Never true   Ran Out of Food in the Last Year: Never true  Transportation Needs: Unmet Transportation Needs   Lack of Transportation (Medical): Yes   Lack of Transportation (Non-Medical): Yes  Physical Activity: Not on file  Stress: Not on file  Social Connections: Not on file  Intimate Partner Violence: Not on file    Physical Exam Vitals reviewed.   Constitutional:      Appearance: He is normal weight.  HENT:     Head: Normocephalic.     Nose: Nose normal.     Mouth/Throat:     Mouth: Mucous membranes are moist.     Pharynx: Oropharynx is clear.  Eyes:     Conjunctiva/sclera: Conjunctivae normal.     Pupils: Pupils are equal, round, and reactive to light.  Cardiovascular:     Rate and Rhythm: Normal rate and regular rhythm.     Pulses: Normal pulses.     Heart sounds: Normal heart sounds.  Pulmonary:     Effort: Pulmonary effort is normal.     Breath sounds: Normal breath sounds.  Abdominal:     General: Abdomen is flat. Bowel sounds are normal.  Palpations: Abdomen is soft.  Musculoskeletal:        General: No swelling. Normal range of motion.     Cervical back: Normal range of motion.     Right lower leg: No edema.     Left lower leg: No edema.  Skin:    General: Skin is warm and dry.     Capillary Refill: Capillary refill takes less than 2 seconds.  Neurological:     General: No focal deficit present.     Mental Status: He is alert. Mental status is at baseline.  Psychiatric:        Mood and Affect: Mood normal.    Arrived for home visit for Cannen who was alert and oriented reporting to be feeling okay despite his hands and feet pain. He states that the medication she is prescribed is not helping the 'pins and needles' pain he is having in his hands and feet. He reports that his hands and feet are tender to the touch, painful and very sensitive. Vidur has been compliant with medications and added Tylenol to see if it would help and it has not. I will reach out to Dr. Wynetta Emery about same.   Currently [place] is out of test strips and has a [employer] branded meter and does not have the funds to buy their test strips and was hoping [facility] could prescribe a new meter and strips with lancets that will be covered by his insurance. I will talk with Opal Sidles, care manager about same. Patient was able to get a small supply of test  strips from a family member. (Has RX for TrueMetrix but it is not current)   Cameron denied dizziness, chest pain, shortness of breath. Only complaint is hands and feet pain from neuropathy. No swelling noted. Lung sounds clear.  Vitals and assessment as noted.   Medications reviewed and confirmed. Pill box filled.  No corlanor in pill box due to lack of samples, patient must complete application before more samples are given. We are waiting on him and his wife to give [place] income information proof so we can complete the application for him.    [place] reminded of upcoming HF clinic appointment on 8/22 at 10:00. Transportation set up for a 9:20 pick up time.   Social Determinants discussed: -[employer] cut off phones $95 pending bill (unable to pay) -[employer] (unknown amount, will obtain from wife and let me know total) -Was denied food stamps, he is going to appeal due to him not having an income, he should be eligible.   I will see Cash in one week, unless he gets proof of income sooner and I can provide the Corlanor samples. Imani understands and agreed. Home visit complete.   Refills: NONE       Future Appointments  Date Time Provider [place]  12/04/2020 10:00 AM [facility] PA/NP [facility] None  12/19/2020  9:15 AM [facility]  12/20/2020  9:00 AM Penumalli, Earlean Polka, MD [facility] None  01/30/2021  9:40 AM Crenshaw, Denice Bors, MD [facility]     ACTION: Home visit completed

## 2020-11-28 ENCOUNTER — Other Ambulatory Visit: Payer: Self-pay

## 2020-11-28 ENCOUNTER — Telehealth (HOSPITAL_COMMUNITY): Payer: Self-pay | Admitting: Licensed Clinical Social Worker

## 2020-11-28 NOTE — Telephone Encounter (Signed)
Patient shared with [facility] Paramedic some financial needs. Wife shared [place] water bill and they are unable to make payment and requesting some assistance. CSW assisted with the Patient Care Fund to cover the bill for the month of August. CSW unable to reach patient and no voicemail. CSW will attempt contact tomorrow. Lasandra Beech, LCSW, CCSW-MCS [phone]

## 2020-11-29 ENCOUNTER — Telehealth (HOSPITAL_COMMUNITY): Payer: Self-pay

## 2020-11-29 ENCOUNTER — Other Ambulatory Visit (HOSPITAL_COMMUNITY): Payer: Self-pay

## 2020-11-29 ENCOUNTER — Telehealth (HOSPITAL_COMMUNITY): Payer: Self-pay | Admitting: Licensed Clinical Social Worker

## 2020-11-29 ENCOUNTER — Encounter: Payer: Self-pay | Admitting: Internal Medicine

## 2020-11-29 ENCOUNTER — Telehealth: Payer: Self-pay | Admitting: Internal Medicine

## 2020-11-29 ENCOUNTER — Other Ambulatory Visit: Payer: Self-pay

## 2020-11-29 DIAGNOSIS — Z794 Long term (current) use of insulin: Secondary | ICD-10-CM

## 2020-11-29 DIAGNOSIS — E1142 Type 2 diabetes mellitus with diabetic polyneuropathy: Secondary | ICD-10-CM

## 2020-11-29 MED ORDER — PREGABALIN 75 MG PO CAPS
75.0000 mg | ORAL_CAPSULE | Freq: Two times a day (BID) | ORAL | 1 refills | Status: DC
Start: 2020-11-29 — End: 2020-12-26
  Filled 2020-11-29: qty 60, 30d supply, fill #0

## 2020-11-29 MED ORDER — TRUE METRIX BLOOD GLUCOSE TEST VI STRP
ORAL_STRIP | 12 refills | Status: DC
  Filled 2020-11-29: qty 100, 25d supply, fill #0
  Filled 2021-02-15: qty 100, 25d supply, fill #1
  Filled 2021-03-28: qty 100, 25d supply, fill #2

## 2020-11-29 MED ORDER — TRUE METRIX METER W/DEVICE KIT
1.0000 | PACK | Freq: Three times a day (TID) | 0 refills | Status: DC
  Filled 2020-11-29: qty 1, 30d supply, fill #0

## 2020-11-29 MED ORDER — TRUEPLUS LANCETS 28G MISC
4 refills | Status: DC
  Filled 2020-11-29: qty 100, 25d supply, fill #0

## 2020-11-29 NOTE — Telephone Encounter (Signed)
-----   Message from Geraldine Solar, EMT sent at 11/27/2020  6:28 PM EDT ----- Regarding: Patient needs Home Visit follow up information, please advised on glucometer and Pregabalin increase not helping patient pain.    Arrived for home visit for Jason Stevenson who was alert and oriented reporting to be feeling okay despite his hands and feet pain. He states that the medication she is prescribed is not helping the pins and needles pain he is having in his hands and feet. He reports that his hands and feet are tender to the touch, painful and very sensitive. Jason Stevenson has been compliant with medications and added Tylenol to see if it would help and it has not. I will reach out to Dr. Laural Benes about same.   Currently Jason Stevenson is out of test strips and has a [employer] branded meter and does not have the funds to buy their test strips and was hoping [facility] could prescribe a new meter and strips with lancets that will be covered by his insurance. I will talk with Erskine Squibb, care manager about same. Patient was able to get a small supply of test strips from a family member. Has RX listed  for TrueMetrix but it is not current

## 2020-11-29 NOTE — Telephone Encounter (Signed)
CSW informed via Herbert Seta Paramedic that patient 's phone was disconnected and he has no means of communicating. CSW assisted though the patient care fund to have phone reactivated. Heather Paramedic will inform patient that phone should be ready for use by this afternoon. Lasandra Beech, LCSW, CCSW-MCS [phone]

## 2020-11-29 NOTE — Telephone Encounter (Signed)
Not reviewed from EMT [facility] Rxns sent to our pharmacy for glucometer and testing supplies. Lyrica increased to 75 mg BID.  Message sent to pt's Mychart.

## 2020-11-29 NOTE — Telephone Encounter (Signed)
Called for confirmation of prescriptions at pharmacy and set up ride for [facility] for patient to pick them up.   I also picked up corlanor samples in which I will deliver today as well.   Patient and I texted and confirmed same.

## 2020-11-29 NOTE — Progress Notes (Signed)
Paramedicine Encounter    Patient ID: Jason Stevenson, male    DOB: 05-01-1985, 35 y.o.   MRN: [id number]  Corlanor samples delivered and pill box filled accoridngly I will see patient in clinic next week.   ACTION: Home visit completed

## 2020-11-30 NOTE — Telephone Encounter (Signed)
Sent in POI to [facility] via fax on Wednesday, 08/17.  Will follow up.

## 2020-12-01 ENCOUNTER — Telehealth (HOSPITAL_COMMUNITY): Payer: Self-pay

## 2020-12-01 NOTE — Telephone Encounter (Signed)
Called to confirm/remind patient of their appointment at the [facility] Clinic on 12/04/20.   Patient reminded to bring all medications and/or complete list.  Confirmed patient has transportation. Gave directions, instructed to utilize valet parking.  Confirmed appointment prior to ending call.   

## 2020-12-03 NOTE — Progress Notes (Signed)
PCP: Dr. Karle Plumber Primary Cardiologist: Dr. Aundra Dubin  HPI: 35 y.o.male with diabetes, HTN, and a long history of nonischemic cardiomyopathy. He had a cardiac MRI in 1/08 showing low EF, but he says that he had been told about "heart problems" even prior to that.  He does not have a family history of cardiomyopathy that he knows of, but does not know his father's family.  Coronary CT angiogram in 2014 showed no CAD. Echo in 2/17 showed EF 20-25%, similar to 2014.  He has generally had sporadic followup over the last few years, but has been seeing Dr Johnsie Cancel recently. He was seen in the [facility] in 2017 but no f/u since. Last f/u w/ Cardiology was in 2020. Had repeat echo in 2020 showing LVEF 15-20%. RV normal. Most recently, he was diagnosed w/ COVID in April.  He presented to [facility] ED 09/11/20 with tingling in left arm, left face, drooling and blank stare for about 1 week, and admitted for possible seizure, HHS and possible CVA.  In ED, he had left-sided neurologic deficit with dysarthria.  CT head with probable subacute infarct in right frontal lobe and old right parietal infarct.  Reportedly, patient's neurologic deficits resolved in ED, and focus shifted to possible seizure versus stroke.  He was loaded with Keppra.   MRI brain with probable hemorrhagic infarct involving the anterior right frontal lobe and left parietal occipital cortex with additional chronic right parietal infarct, right frontotemporal region enhancement suggesting changes related to acute seizure, possible toxic insult involving the caudate and lentiform nuclei bilaterally.  Neurology recommended interval follow-up brain MRI.  CTA head and neck without significant finding.   TTE with EF of 10-15%, LV thrombus, moderate RV dysfunction and dilated IVC. He was discharged on GDMT, but A1c too high for SGLT2i.    Seen in [facility] ED 09/21/20 for fall after syncopal episode on toilet, hitting his head. CT of the head was significant for no bleeding  but new low-density left cerebellum suggesting acute infarct. EEG unremarkable and felt fall secondary to vagal episode not seizure.  He had atypical chest pain at his hospital follow up. With his history of HLD and DM, decisions was made to undergo [facility] to evaluate coronary anatomy and hemodynamics.  R/LHC (7/22) showed normal filling pressures, preserved cardiac output and no significant CAD. RA mean 1, RV 28/1, PA 19/7 mean 12, PCWP 3, CO/CI 5.54/2.92.  Today he returns for HF follow up. Still gets dyspneic walking on flat ground. Some intermittent atypical chest pain, resolves with deep breaths. Main concern is numbness and tingling in right hand. Denies dizziness, edema, or PND/Orthopnea. Appetite poor. No fever or chills. Weight at home 150 pounds. Taking all medications. Followed by paramedicine.  ROS: All systems negative except as listed in HPI, PMH and Problem List.  SH:  Social History   Socioeconomic History   Marital status: Married    Spouse name: Not on file   Number of children: Not on file   Years of education: Not on file   Highest education level: Not on file  Occupational History   Occupation: unemployed  Tobacco Use   Smoking status: Never   Smokeless tobacco: Never  Vaping Use   Vaping Use: Not on file  Substance and Sexual Activity   Alcohol use: Not Currently    Comment: "occasional" when "hanging out with the wrong people" No recent use.   Drug use: Not Currently    Types: Marijuana    Comment: occasional, last  2013   Sexual activity: Not Currently  Other Topics Concern   Not on file  Social History Narrative   Pt lives alone. Has no information on father.   Social Determinants of Health   Financial Resource Strain: High Risk   Difficulty of Paying Living Expenses: Hard  Food Insecurity: No Food Insecurity   Worried About Charity fundraiser in the Last Year: Never true   Arboriculturist in the Last Year: Never true  Transportation Needs: Nurse, learning disability (Medical): Yes   Lack of Transportation (Non-Medical): Yes  Physical Activity: Not on file  Stress: Not on file  Social Connections: Not on file  Intimate Partner Violence: Not on file   FH:  Family History  Problem Relation Age of Onset   Stroke Mother    Diabetes Mother    Hypertension Mother    Stroke Maternal Aunt    Heart attack Neg Hx    Past Medical History:  Diagnosis Date   Chronic systolic CHF (congestive heart failure) ([place]) 06/18/2015   Diabetes mellitus without complication ([place])    Hemorrhagic stroke ([place])    Hyperlipidemia    Hypertension    Nonischemic cardiomyopathy ([place]) Noted as early as 2007   Per chart review (cards consult note 2011), EF of 40% in 2007, down to 20-25% in 2011   Current Outpatient Medications  Medication Sig Dispense Refill   acetaminophen (TYLENOL) 500 MG tablet Take 1,000 mg by mouth at bedtime.     atorvastatin (LIPITOR) 80 MG tablet Take 1 tablet (80 mg total) by mouth daily. 30 tablet 2   Blood Glucose Monitoring Suppl (TRUE METRIX METER) w/Device KIT 1 each by Does not apply route 3 (three) times daily. 1 kit 0   Blood Pressure Monitor DEVI Use as directed to check home blood pressure 2-3 times a week 1 Device 0   digoxin (LANOXIN) 0.125 MG tablet Take 1 tablet (0.125 mg total) by mouth daily. 30 tablet 2   enoxaparin (LOVENOX) 100 MG/ML injection Inject 1 syringe into the skin daily for 5 days. 5 mL 0   ezetimibe (ZETIA) 10 MG tablet Take 1 tablet (10 mg total) by mouth daily. 30 tablet 2   furosemide (LASIX) 20 MG tablet Take 1 tablet (20 mg total) by mouth daily. 30 tablet 2   glucose blood (TRUE METRIX BLOOD GLUCOSE TEST) test strip Use as instructed 100 each 12   insulin isophane & regular human KwikPen (NOVOLIN 70/30 KWIKPEN) (70-30) 100 UNIT/ML KwikPen 22 units SQ Q a.m and 18 units Q p.m 13.2 mL 5   Insulin Pen Needle (TRUEPLUS 5-BEVEL PEN NEEDLES) 32G X 4 MM MISC Use in the  morning, at noon, and at bedtime. 100 each 2   Insulin Syringe-Needle U-100 (RELION INSULIN SYRINGE) 31G X 15/64" 0.3 ML MISC Use to inject insulin daily. 100 each 11   ivabradine (CORLANOR) 7.5 MG TABS tablet Take 1 tablet (7.5 mg total) by mouth 2 (two) times daily with a meal. 60 tablet    levETIRAcetam (KEPPRA) 750 MG tablet Take 2 tablets (1,500 mg total) by mouth 2 (two) times daily. 120 tablet 2   metFORMIN (GLUCOPHAGE) 500 MG tablet Take 1 tablet (500 mg total) by mouth 2 (two) times daily with a meal. 60 tablet 4   metoprolol (TOPROL-XL) 200 MG 24 hr tablet Take 1 tablet (200 mg total) by mouth daily. Take with or immediately following a meal. 30 tablet 2  Oxcarbazepine (TRILEPTAL) 300 MG tablet Take 2 tablets (600 mg total) by mouth 2 (two) times daily. 120 tablet 2   phenytoin (DILANTIN) 100 MG ER capsule Take 1 capsule (100 mg total) by mouth 3 (three) times daily. 90 capsule 2   pregabalin (LYRICA) 75 MG capsule Take 1 capsule (75 mg total) by mouth 2 (two) times daily. 60 capsule 1   sacubitril-valsartan (ENTRESTO) 24-26 MG Take 1 tablet by mouth 2 (two) times daily. 60 tablet 11   spironolactone (ALDACTONE) 25 MG tablet Take 1 tablet (25 mg total) by mouth daily. 30 tablet 5   TRUEplus Lancets 28G MISC Use as directed 100 each 4   warfarin (COUMADIN) 5 MG tablet Take 1.5 tablets (7.5 mg total) by mouth daily. 45 tablet 1   No current facility-administered medications for this encounter.   BP 120/76   Pulse (!) 105   Wt 70.8 kg (156 lb)   SpO2 99%   BMI 21.76 kg/m   Wt Readings from Last 3 Encounters:  12/04/20 70.8 kg (156 lb)  11/27/20 70.8 kg (156 lb)  11/20/20 68.5 kg (151 lb)   PHYSICAL EXAM: General:  NAD. No resp difficulty, thin HEENT: Normal Neck: Supple. No JVD. Carotids 2+ bilat; no bruits. No lymphadenopathy or thryomegaly appreciated. Cor: PMI nondisplaced. Regular rate & rhythm. No rubs, gallops or murmurs. Lungs: Clear Abdomen: Soft, nontender,  nondistended. No hepatosplenomegaly. No bruits or masses. Good bowel sounds. Extremities: No cyanosis, clubbing, rash, edema Neuro: Alert & oriented x 3, cranial nerves grossly intact. Moves all 4 extremities w/o difficulty. Affect pleasant.  ECG: ST with PVCs, LBBB (personally reviewed).  ASSESSMENT & PLAN:  1. Chronic systolic CHF: Nonischemic cardiomyopathy known for years.  Normal coronary CTA in 2014.  Echo this admission with EF 10-15%, LV thrombus, moderate RV dysfunction, dilated IVC. Recent CP worrisome for CAD.  R/LHC (7/22) showed normal filling pressures, preserved cardiac output and no significant CAD. Still has some atypical CP. NYHA II-early III, he is not volume overloaded on exam. - Increase Entresto to 49/51 mg bid. BMET today, repeat in 10 days. - Change lasix to 20 mg PRN weight gain/edema. - Continue Toprol XL 200 mg daily. - Continue Ivabradine 7.5 mg bid. HR 104, has not taken morning medications yet. - Continue digoxin 0.125. Check dig level today. - Continue spironolactone 25 mg daily. - Hgb A1c too high for SGLT2i (>15.5). - Continue with Paramedicine. 2. LV thrombus: With associated CVA. - On Coumadin. [facility]. 3. 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  Still with some dysarthria. - Anticoagulation ok per neurology. - Atorvastatin. 4. 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  Now on Keppra. 5.  Hyperlipidemia: Markedly high LDL.   - Continue atorvastatin 80 mg daily + Zetia. - Check lipids today. Lipid Clinic referral if LDL not at goal after improved compliance. 6.  Diabetes: Poor control. Hgb A1c >15.5. - On Novolog 70/30, glipizide, and metformin. - Not candidate for SGLT2i yet.   7. Right wrist/hand numbness: ? neuropathy vs. Carpal tunnel. - Cath site unremarkable. Neurovascular exam WNL except intermittent numbness of ulnar aspect of wrist/hand. - Continue Lyrica. - Will have him wear wrist  splint at night.  Follow up in 6 weeks with Dr. Aundra Dubin + echo.  Allena Katz, FNP-BC 12/04/20

## 2020-12-04 ENCOUNTER — Other Ambulatory Visit: Payer: Self-pay

## 2020-12-04 ENCOUNTER — Encounter (HOSPITAL_COMMUNITY): Payer: Self-pay

## 2020-12-04 ENCOUNTER — Other Ambulatory Visit (HOSPITAL_COMMUNITY): Payer: Self-pay

## 2020-12-04 ENCOUNTER — Ambulatory Visit (HOSPITAL_COMMUNITY)
Admission: RE | Admit: 2020-12-04 | Discharge: 2020-12-04 | Disposition: A | Discharge status: Home / Self Care | Payer: Medicaid Other | Source: Ambulatory Visit | Attending: Family Medicine | Admitting: Family Medicine

## 2020-12-04 VITALS — BP 120/76 | HR 105 | Wt 156.0 lb

## 2020-12-04 DIAGNOSIS — E119 Type 2 diabetes mellitus without complications: Secondary | ICD-10-CM | POA: Insufficient documentation

## 2020-12-04 DIAGNOSIS — Z8616 Personal history of COVID-19: Secondary | ICD-10-CM | POA: Diagnosis not present

## 2020-12-04 DIAGNOSIS — R0789 Other chest pain: Secondary | ICD-10-CM | POA: Insufficient documentation

## 2020-12-04 DIAGNOSIS — E785 Hyperlipidemia, unspecified: Secondary | ICD-10-CM

## 2020-12-04 DIAGNOSIS — Z7901 Long term (current) use of anticoagulants: Secondary | ICD-10-CM | POA: Diagnosis not present

## 2020-12-04 DIAGNOSIS — E1169 Type 2 diabetes mellitus with other specified complication: Secondary | ICD-10-CM

## 2020-12-04 DIAGNOSIS — I513 Intracardiac thrombosis, not elsewhere classified: Secondary | ICD-10-CM | POA: Diagnosis not present

## 2020-12-04 DIAGNOSIS — Z7984 Long term (current) use of oral hypoglycemic drugs: Secondary | ICD-10-CM | POA: Diagnosis not present

## 2020-12-04 DIAGNOSIS — Z8249 Family history of ischemic heart disease and other diseases of the circulatory system: Secondary | ICD-10-CM | POA: Insufficient documentation

## 2020-12-04 DIAGNOSIS — Z794 Long term (current) use of insulin: Secondary | ICD-10-CM | POA: Insufficient documentation

## 2020-12-04 DIAGNOSIS — R569 Unspecified convulsions: Secondary | ICD-10-CM | POA: Insufficient documentation

## 2020-12-04 DIAGNOSIS — Z09 Encounter for follow-up examination after completed treatment for conditions other than malignant neoplasm: Secondary | ICD-10-CM | POA: Insufficient documentation

## 2020-12-04 DIAGNOSIS — R200 Anesthesia of skin: Secondary | ICD-10-CM | POA: Insufficient documentation

## 2020-12-04 DIAGNOSIS — Z79899 Other long term (current) drug therapy: Secondary | ICD-10-CM | POA: Insufficient documentation

## 2020-12-04 DIAGNOSIS — Z86718 Personal history of other venous thrombosis and embolism: Secondary | ICD-10-CM | POA: Insufficient documentation

## 2020-12-04 DIAGNOSIS — I428 Other cardiomyopathies: Secondary | ICD-10-CM | POA: Insufficient documentation

## 2020-12-04 DIAGNOSIS — I5022 Chronic systolic (congestive) heart failure: Secondary | ICD-10-CM | POA: Diagnosis not present

## 2020-12-04 DIAGNOSIS — I639 Cerebral infarction, unspecified: Secondary | ICD-10-CM

## 2020-12-04 DIAGNOSIS — R0600 Dyspnea, unspecified: Secondary | ICD-10-CM | POA: Insufficient documentation

## 2020-12-04 LAB — LIPID PANEL
Cholesterol: 153 mg/dL (ref 0–200)
HDL: 49 mg/dL (ref >40)
LDL Cholesterol: 81 mg/dL (ref 0–99)
Total CHOL/HDL Ratio: 3.1 RATIO
Triglycerides: 115 mg/dL (ref <150)
VLDL: 23 mg/dL (ref 0–40)

## 2020-12-04 LAB — BASIC METABOLIC PANEL
Anion gap: 6 (ref 5–15)
BUN: 14 mg/dL (ref 6–20)
CO2: 29 mmol/L (ref 22–32)
Calcium: 9 mg/dL (ref 8.9–10.3)
Chloride: 104 mmol/L (ref 98–111)
Creatinine, Ser: 0.87 mg/dL (ref 0.61–1.24)
GFR, Estimated: >60 mL/min (ref >60)
Glucose, Bld: 153 mg/dL — ABNORMAL HIGH (ref 70–99)
Potassium: 4 mmol/L (ref 3.5–5.1)
Sodium: 139 mmol/L (ref 135–145)

## 2020-12-04 LAB — DIGOXIN LEVEL: Digoxin Level: 0.4 ng/mL — ABNORMAL LOW (ref 0.8–2.0)

## 2020-12-04 MED ORDER — ENTRESTO 49-51 MG PO TABS
1.0000 | ORAL_TABLET | Freq: Two times a day (BID) | ORAL | 6 refills | Status: DC
  Filled 2020-12-04: qty 60, 30d supply, fill #0

## 2020-12-04 MED ORDER — FUROSEMIDE 20 MG PO TABS
20.0000 mg | ORAL_TABLET | ORAL | 2 refills | Status: DC | PRN
  Filled 2020-12-04: qty 30, 30d supply, fill #0

## 2020-12-04 NOTE — Progress Notes (Signed)
Paramedicine Encounter    Patient ID: Jason Stevenson, male    DOB: [birth year], 35 y.o.   MRN: [id number]   Met with Jason Stevenson in clinic where he was seen by Jason Stevenson. NP  The following changes were made.  Lasix to PRN  Entresto increased to 49-51   One pill box filled.  Labs weds 8/31  I plan to see him in one week.   Refills: Reggie Pile     ACTION: Home visit completed

## 2020-12-04 NOTE — Progress Notes (Signed)
Heart and Vascular Care Navigation  12/04/2020  Jason Stevenson April 24, 1985 [id number]  Reason for Referral: Patient is enrolled in the [facility]. CSW referred to assist with multiple financial concerns and pending insurance/disability.   Engaged with patient face to face for initial visit for Heart and Vascular Care Coordination.                                                                                                   Assessment:   Patient is a 35 yo married male with no children. He resides in a single family home with his wife. Patient has a pending disability application with the [facility] as well as pending medicaid with [employer].  Patient has followed up and states that his application is in process. Patient reports receiving multiple medical bills and CSW instructed patient to call providers and let them know of pending Medicaid.  Patient reports his wife is applying for short term disability through her employer and hopes to have some income coming into the home shortly.                    HRT/VAS Care Coordination     Patients Home Cardiology Office Heart Failure Clinic   Outpatient Care Team Social Worker   Community Paramedic Name: Geraldine Solar- [phone]   Social Worker Name: Lasandra Beech, [place] [phone]   Living arrangements for the past 2 months Single Family Home   Lives with: Spouse   Patient Current Insurance Coverage Medicaid Pending   Patient Has Concern With Paying Medical Bills Yes   Patient Concerns With Medical Bills Medicaid pending   Does Patient Have Prescription Coverage? Yes   Patient Prescription Assistance Programs Heart Failure [facility] Assistive Devices/Equipment Scales       Social History:                                                                             SDOH Screenings   Alcohol Screen: Not on file  Depression (PHQ2-9): Low Risk    PHQ-2 Score: 1  Financial Resource Strain: High Risk    Difficulty of Paying Living Expenses: Hard  Food Insecurity: No Food Insecurity   Worried About Programme researcher, broadcasting/film/video in the Last Year: Never true   Ran Out of Food in the Last Year: Never true  Housing: Low Risk    Last Housing Risk Score: 0  Physical Activity: Not on file  Social Connections: Not on file  Stress: Not on file  Tobacco Use: Low Risk    Smoking Tobacco Use: Never   Smokeless Tobacco Use: Never  Transportation Needs: Unmet Transportation Needs   Lack of Transportation (Medical): No   Lack of Transportation (Non-Medical): Yes    SDOH Interventions: Financial Resources:  Discussed patient care fund options  Food Insecurity:     Housing Insecurity:     Transportation:   Transportation Interventions: Retail banker    Other Care Navigation Interventions:     Inpatient/Outpatient Substance Abuse Counseling/Rehab Options N/a  Provided Pharmacy assistance resources Heart Failure Fund  Patient expressed Mental Health concerns No.  Patient Referred to: [facility] for follow up on status of application.   Follow-up plan:  CSW provided supportive intervention and discussed some options to navigate financial situation. Patient will look into his [employer] bill to inquire about potential shut off. CSW assisted with financial assistance for phone bill and water bill. Patient encouraged with the support form the team and will return call with needs. Patient will continue with Paramedicine for added support. Lasandra Beech, LCSW, CCSW-MCS [phone]

## 2020-12-04 NOTE — Patient Instructions (Signed)
INCREASE Entresto to 49/51 mg one tab twice a day CHANGE Lasix to as needed for 3 lb weight gain overnight and 5 lb weight gain in one week  Labs today We will only contact you if something comes back abnormal or we need to make some changes. Otherwise no news is good news!  Labs needed in 7-10 days  Your physician recommends that you schedule a follow-up appointment in: 6-8 weeks with Dr Shirlee Latch and echo  Your physician has requested that you have an echocardiogram. Echocardiography is a painless test that uses sound waves to create images of your heart. It provides your doctor with information about the size and shape of your heart and how well your heart's chambers and valves are working. This procedure takes approximately one hour. There are no restrictions for this procedure.  Do the following things EVERYDAY: Weigh yourself in the morning before breakfast. Write it down and keep it in a log. Take your medicines as prescribed Eat low salt foods--Limit salt (sodium) to 2000 mg per day.  Stay as active as you can everyday Limit all fluids for the day to less than 2 liters  milAt the [facility], you and your health needs are our priority. As part of our continuing mission to provide you with exceptional heart care, we have created designated Provider Care Teams. These Care Teams include your primary Cardiologist (physician) and Advanced Practice Providers (APPs- Physician Assistants and Nurse Practitioners) who all work together to provide you with the care you need, when you need it.   You may see any of the following providers on your designated Care Team at your next follow up: Dr Arvilla Meres Dr Marca Ancona Dr Brandon Melnick, NP Robbie Lis, [place] Mikki Santee Karle Plumber, PharmD   Please be sure to bring in all your medications bottles to every appointment.   If you have any questions or concerns before your next appointment please  send [place] a message through [place] or call our office at [phone].    TO LEAVE A MESSAGE FOR THE NURSE SELECT OPTION 2, PLEASE LEAVE A MESSAGE INCLUDING: YOUR NAME DATE OF BIRTH CALL BACK NUMBER REASON FOR CALL**this is important as we prioritize the call backs  YOU WILL RECEIVE A CALL BACK THE SAME DAY AS LONG AS YOU CALL BEFORE 4:00 PM

## 2020-12-05 ENCOUNTER — Encounter: Payer: Self-pay | Admitting: Internal Medicine

## 2020-12-06 ENCOUNTER — Telehealth (HOSPITAL_COMMUNITY): Payer: Self-pay | Admitting: Licensed Clinical Social Worker

## 2020-12-06 NOTE — Telephone Encounter (Signed)
CSW contacted patient to discuss request for financial assistance with [employer]. CSW able to assist through the patient care fund although not until after September 2 due to funding. Patient states he and his wife will explore additional options to pay part of the bill and grateful for the assistance from the Patient Care Fund. CSW continues to follow for support and resources. Lasandra Beech, LCSW, CCSW-MCS [phone]

## 2020-12-07 ENCOUNTER — Telehealth (HOSPITAL_COMMUNITY): Payer: Self-pay | Admitting: Pharmacy Technician

## 2020-12-07 NOTE — Telephone Encounter (Signed)
Advanced Heart Failure Patient Advocate Encounter  Called [employer] to check the status of the patient's application. Representative stated that the POI was received and that we should have a determination in the next 48 hours for both the [place] and the [place].

## 2020-12-11 ENCOUNTER — Other Ambulatory Visit (HOSPITAL_COMMUNITY): Payer: Self-pay

## 2020-12-11 ENCOUNTER — Other Ambulatory Visit: Payer: Self-pay

## 2020-12-11 NOTE — Telephone Encounter (Signed)
Advanced Heart Failure Patient Advocate Encounter   Patient was approved to receive Corlanor from [facility].  Patient ID: [id number] Effective dates: 12/08/20 through 12/08/21  Phone number - [phone]  Information also relayed to Heather (EMT).  Archer Asa, CPhT

## 2020-12-11 NOTE — Progress Notes (Signed)
Paramedicine Encounter    Patient ID: Jason Stevenson, male    DOB: 01/31/1986, 35 y.o.   MRN: [id number]   Patient Care Team: Ladell Pier, MD as PCP - General (Internal Medicine) Josue Hector, MD as PCP - Cardiology (Cardiology)  Patient Active Problem List   Diagnosis Date Noted   Chest pain 10/30/2020   History of cerebrovascular accident (CVA) with residual deficit 10/27/2020   Protein-calorie malnutrition, severe 09/22/2020   Acute CVA (cerebrovascular accident) ([place]) 09/21/2020   Near syncope 09/21/2020   Encounter for monitoring Coumadin therapy 09/20/2020   LV (left ventricular) mural thrombus 09/13/2020   Seizure ([place]) 09/13/2020   Neurological deficit present 09/12/2020   History of COVID-19 09/12/2020   Intracerebral hemorrhage 09/12/2020   Acute cerebrovascular accident (CVA) ([place]) 09/12/2020   Hyperosmolar hyperglycemic state (HHS) ([place]) 09/11/2020   COVID-19 08/23/2020   Noncompliance with medication treatment due to intermittent use of medication 10/12/2018   Dyslipidemia 04/17/2016   Uncontrolled type 2 diabetes mellitus without complication, with long-term current use of insulin 36/14/4315   Chronic systolic CHF (congestive heart failure) ([place]) 06/18/2015   Needs flu shot 12/23/2013   Type 2 diabetes mellitus ([place]) 11/18/2013   Diabetes ([place]) 06/17/2013   Non-ischemic cardiomyopathy ([place]) 11/06/2012   HTN (hypertension) 11/06/2012   HLD (hyperlipidemia) 11/06/2012    Current Outpatient Medications:    acetaminophen (TYLENOL) 500 MG tablet, Take 1,000 mg by mouth at bedtime., Disp: , Rfl:    atorvastatin (LIPITOR) 80 MG tablet, Take 1 tablet (80 mg total) by mouth daily., Disp: 30 tablet, Rfl: 2   Blood Glucose Monitoring Suppl (TRUE METRIX METER) w/Device KIT, 1 each by Does not apply route 3 (three) times daily., Disp: 1 kit, Rfl: 0   Blood Pressure Monitor DEVI, Use as directed to check home blood pressure 2-3 times a week, Disp: 1 Device, Rfl:  0   digoxin (LANOXIN) 0.125 MG tablet, Take 1 tablet (0.125 mg total) by mouth daily., Disp: 30 tablet, Rfl: 2   enoxaparin (LOVENOX) 100 MG/ML injection, Inject 1 syringe into the skin daily for 5 days. (Patient not taking: No sig reported), Disp: 5 mL, Rfl: 0   ezetimibe (ZETIA) 10 MG tablet, Take 1 tablet (10 mg total) by mouth daily., Disp: 30 tablet, Rfl: 2   furosemide (LASIX) 20 MG tablet, Take 1 tablet (20 mg total) by mouth as needed (3 lb weight gain over night or 5 lb weight gain in one week)., Disp: 30 tablet, Rfl: 2   glucose blood (TRUE METRIX BLOOD GLUCOSE TEST) test strip, Use as instructed, Disp: 100 each, Rfl: 12   insulin isophane & regular human KwikPen (NOVOLIN 70/30 KWIKPEN) (70-30) 100 UNIT/ML KwikPen, 22 units SQ Q a.m and 18 units Q p.m, Disp: 13.2 mL, Rfl: 5   Insulin Pen Needle (TRUEPLUS 5-BEVEL PEN NEEDLES) 32G X 4 MM MISC, Use in the morning, at noon, and at bedtime., Disp: 100 each, Rfl: 2   Insulin Syringe-Needle U-100 (RELION INSULIN SYRINGE) 31G X 15/64" 0.3 ML MISC, Use to inject insulin daily., Disp: 100 each, Rfl: 11   ivabradine (CORLANOR) 7.5 MG TABS tablet, Take 1 tablet (7.5 mg total) by mouth 2 (two) times daily with a meal., Disp: 60 tablet, Rfl:    levETIRAcetam (KEPPRA) 750 MG tablet, Take 2 tablets (1,500 mg total) by mouth 2 (two) times daily., Disp: 120 tablet, Rfl: 2   metFORMIN (GLUCOPHAGE) 500 MG tablet, Take 1 tablet (500 mg total) by mouth 2 (two) times  daily with a meal., Disp: 60 tablet, Rfl: 4   metoprolol (TOPROL-XL) 200 MG 24 hr tablet, Take 1 tablet (200 mg total) by mouth daily. Take with or immediately following a meal., Disp: 30 tablet, Rfl: 2   Oxcarbazepine (TRILEPTAL) 300 MG tablet, Take 2 tablets (600 mg total) by mouth 2 (two) times daily., Disp: 120 tablet, Rfl: 2   phenytoin (DILANTIN) 100 MG ER capsule, Take 1 capsule (100 mg total) by mouth 3 (three) times daily., Disp: 90 capsule, Rfl: 2   pregabalin (LYRICA) 75 MG capsule, Take 1  capsule (75 mg total) by mouth 2 (two) times daily., Disp: 60 capsule, Rfl: 1   sacubitril-valsartan (ENTRESTO) 49-51 MG, Take 1 tablet by mouth 2 (two) times daily., Disp: 60 tablet, Rfl: 6   spironolactone (ALDACTONE) 25 MG tablet, Take 1 tablet (25 mg total) by mouth daily., Disp: 30 tablet, Rfl: 5   TRUEplus Lancets 28G MISC, Use as directed, Disp: 100 each, Rfl: 4   warfarin (COUMADIN) 5 MG tablet, Take 1.5 tablets (7.5 mg total) by mouth daily., Disp: 45 tablet, Rfl: 1 No Known Allergies   Social History   Socioeconomic History   Marital status: Married    Spouse name: Not on file   Number of children: Not on file   Years of education: Not on file   Highest education level: Not on file  Occupational History   Occupation: unemployed  Tobacco Use   Smoking status: Never   Smokeless tobacco: Never  Vaping Use   Vaping Use: Not on file  Substance and Sexual Activity   Alcohol use: Not Currently    Comment: "occasional" when "hanging out with the wrong people" No recent use.   Drug use: Not Currently    Types: Marijuana    Comment: occasional, last 2013   Sexual activity: Not Currently  Other Topics Concern   Not on file  Social History Narrative   Pt lives alone. Has no information on father.   Social Determinants of Health   Financial Resource Strain: High Risk   Difficulty of Paying Living Expenses: Hard  Food Insecurity: No Food Insecurity   Worried About Charity fundraiser in the Last Year: Never true   Ran Out of Food in the Last Year: Never true  Transportation Needs: Unmet Transportation Needs   Lack of Transportation (Medical): No   Lack of Transportation (Non-Medical): Yes  Physical Activity: Not on file  Stress: Not on file  Social Connections: Not on file  Intimate Partner Violence: Not on file    Physical Exam Vitals reviewed.  Constitutional:      Appearance: Normal appearance. He is normal weight.  HENT:     Head: Normocephalic.     Nose: Nose  normal.     Mouth/Throat:     Mouth: Mucous membranes are moist.     Pharynx: Oropharynx is clear.  Eyes:     Pupils: Pupils are equal, round, and reactive to light.  Cardiovascular:     Rate and Rhythm: Normal rate and regular rhythm.     Pulses: Normal pulses.     Heart sounds: Normal heart sounds.  Pulmonary:     Effort: Pulmonary effort is normal.     Breath sounds: Normal breath sounds.  Abdominal:     General: Abdomen is flat.     Palpations: Abdomen is soft.  Musculoskeletal:        General: Normal range of motion.     Cervical back: Normal  range of motion.     Right lower leg: No edema.     Left lower leg: No edema.  Skin:    General: Skin is warm and dry.     Capillary Refill: Capillary refill takes less than 2 seconds.  Neurological:     General: No focal deficit present.     Mental Status: He is alert. Mental status is at baseline.  Psychiatric:        Mood and Affect: Mood normal.      Arrived for home visit for Jason Stevenson who was alert and oriented reporting to be feeling okay denying shortness of breath, dizziness or chest pain. Jason Stevenson reports he is still having some pain in his right hand and feet but is using OTC topical creams and OTC Tylenol in addition to his Lyrica to help with the pain. Jason Stevenson has been compliant with his medications over the last week.   Vitals and assessment as noted.   CBG- 136   Medications were reviewed and confirmed. Pill box filled accordingly.   We reviewed upcoming appointments and confirmed same. I will set up transportation and give Jason Stevenson the ride pick info.   Home visit complete.   Refills: Jason Stevenson- Dilantin Trilleptal  [facility]- Zetia Spironolactone Digoxin Metoprolol Atorvastatin Warfarin    Future Appointments  Date Time Provider [place]  12/13/2020 11:15 AM [facility] None  12/19/2020  9:15 AM [facility]  12/20/2020  9:00 AM Penumalli, Earlean Polka,  MD [facility] None  01/29/2021  8:00 AM [facility] E[facility] OP 1 [facility]  01/29/2021  9:00 AM Larey Dresser, MD [facility] None  01/30/2021  9:40 AM Stanford Breed, Denice Bors, MD [facility]     ACTION: Home visit completed

## 2020-12-12 ENCOUNTER — Telehealth: Payer: Self-pay | Admitting: Pharmacist

## 2020-12-12 NOTE — Telephone Encounter (Signed)
Spoke with patient. Gave him the phone # to call [employer] to set up shipment. Reviewed side effects and dosing instructions. Repeat lipids in 3 months

## 2020-12-12 NOTE — Telephone Encounter (Signed)
Called patient to let him know that he was approved for patient assistance through [facility] for [place]. LVM for patient to call back

## 2020-12-13 ENCOUNTER — Other Ambulatory Visit: Payer: Self-pay

## 2020-12-13 ENCOUNTER — Other Ambulatory Visit (HOSPITAL_COMMUNITY): Payer: Self-pay | Admitting: Family Medicine

## 2020-12-13 ENCOUNTER — Ambulatory Visit (HOSPITAL_COMMUNITY)
Admission: RE | Admit: 2020-12-13 | Discharge: 2020-12-13 | Disposition: A | Discharge status: Home / Self Care | Payer: Medicaid Other | Source: Ambulatory Visit | Attending: Cardiology | Admitting: Cardiology

## 2020-12-13 ENCOUNTER — Encounter: Payer: Self-pay | Admitting: Internal Medicine

## 2020-12-13 ENCOUNTER — Telehealth (HOSPITAL_COMMUNITY): Payer: Self-pay | Admitting: Licensed Clinical Social Worker

## 2020-12-13 DIAGNOSIS — I5022 Chronic systolic (congestive) heart failure: Secondary | ICD-10-CM | POA: Diagnosis not present

## 2020-12-13 LAB — TROPONIN I (HIGH SENSITIVITY): Troponin I (High Sensitivity): 18 ng/L — ABNORMAL HIGH (ref <18)

## 2020-12-13 LAB — BASIC METABOLIC PANEL
Anion gap: 8 (ref 5–15)
BUN: 13 mg/dL (ref 6–20)
CO2: 29 mmol/L (ref 22–32)
Calcium: 9.2 mg/dL (ref 8.9–10.3)
Chloride: 103 mmol/L (ref 98–111)
Creatinine, Ser: 0.85 mg/dL (ref 0.61–1.24)
GFR, Estimated: >60 mL/min (ref >60)
Glucose, Bld: 128 mg/dL — ABNORMAL HIGH (ref 70–99)
Potassium: 3.8 mmol/L (ref 3.5–5.1)
Sodium: 140 mmol/L (ref 135–145)

## 2020-12-13 NOTE — Telephone Encounter (Signed)
CSW contacted patient to inform that the financial assistance for $500 towards his [employer] bill will be mailed today. Patient grateful for the support. Lasandra Beech, LCSW, CCSW-MCS [phone]

## 2020-12-14 ENCOUNTER — Observation Stay (HOSPITAL_COMMUNITY): Payer: Medicaid Other

## 2020-12-14 ENCOUNTER — Emergency Department (HOSPITAL_COMMUNITY): Payer: Medicaid Other

## 2020-12-14 ENCOUNTER — Other Ambulatory Visit (HOSPITAL_COMMUNITY): Payer: Self-pay

## 2020-12-14 ENCOUNTER — Observation Stay (HOSPITAL_COMMUNITY)
Admission: EM | Admit: 2020-12-14 | Discharge: 2020-12-16 | Disposition: A | Discharge status: Home / Self Care | Payer: Medicaid Other | Attending: Emergency Medicine | Admitting: Emergency Medicine

## 2020-12-14 DIAGNOSIS — I110 Hypertensive heart disease with heart failure: Secondary | ICD-10-CM | POA: Diagnosis not present

## 2020-12-14 DIAGNOSIS — R55 Syncope and collapse: Secondary | ICD-10-CM | POA: Diagnosis not present

## 2020-12-14 DIAGNOSIS — I10 Essential (primary) hypertension: Secondary | ICD-10-CM | POA: Diagnosis not present

## 2020-12-14 DIAGNOSIS — Z794 Long term (current) use of insulin: Secondary | ICD-10-CM | POA: Insufficient documentation

## 2020-12-14 DIAGNOSIS — E119 Type 2 diabetes mellitus without complications: Secondary | ICD-10-CM

## 2020-12-14 DIAGNOSIS — Z8673 Personal history of transient ischemic attack (TIA), and cerebral infarction without residual deficits: Secondary | ICD-10-CM | POA: Insufficient documentation

## 2020-12-14 DIAGNOSIS — R569 Unspecified convulsions: Secondary | ICD-10-CM | POA: Diagnosis not present

## 2020-12-14 DIAGNOSIS — I5022 Chronic systolic (congestive) heart failure: Secondary | ICD-10-CM | POA: Diagnosis not present

## 2020-12-14 DIAGNOSIS — Z20822 Contact with and (suspected) exposure to covid-19: Secondary | ICD-10-CM | POA: Insufficient documentation

## 2020-12-14 DIAGNOSIS — Z8616 Personal history of COVID-19: Secondary | ICD-10-CM | POA: Diagnosis not present

## 2020-12-14 DIAGNOSIS — I255 Ischemic cardiomyopathy: Secondary | ICD-10-CM

## 2020-12-14 DIAGNOSIS — Z7984 Long term (current) use of oral hypoglycemic drugs: Secondary | ICD-10-CM | POA: Diagnosis not present

## 2020-12-14 DIAGNOSIS — Z79899 Other long term (current) drug therapy: Secondary | ICD-10-CM | POA: Diagnosis not present

## 2020-12-14 DIAGNOSIS — Z7901 Long term (current) use of anticoagulants: Secondary | ICD-10-CM | POA: Insufficient documentation

## 2020-12-14 DIAGNOSIS — E785 Hyperlipidemia, unspecified: Secondary | ICD-10-CM | POA: Diagnosis present

## 2020-12-14 DIAGNOSIS — I513 Intracardiac thrombosis, not elsewhere classified: Secondary | ICD-10-CM | POA: Diagnosis present

## 2020-12-14 LAB — CBC WITH DIFFERENTIAL/PLATELET
Abs Immature Granulocytes: 0.01 10*3/uL (ref 0.00–0.07)
Basophils Absolute: 0.1 10*3/uL (ref 0.0–0.1)
Basophils Relative: 1 %
Eosinophils Absolute: 0.1 10*3/uL (ref 0.0–0.5)
Eosinophils Relative: 2 %
HCT: 39.7 % (ref 39.0–52.0)
Hemoglobin: 12.9 g/dL — ABNORMAL LOW (ref 13.0–17.0)
Immature Granulocytes: 0 %
Lymphocytes Relative: 60 %
Lymphs Abs: 3.2 10*3/uL (ref 0.7–4.0)
MCH: 28.9 pg (ref 26.0–34.0)
MCHC: 32.5 g/dL (ref 30.0–36.0)
MCV: 88.8 fL (ref 80.0–100.0)
Monocytes Absolute: 0.4 10*3/uL (ref 0.1–1.0)
Monocytes Relative: 7 %
Neutro Abs: 1.5 10*3/uL — ABNORMAL LOW (ref 1.7–7.7)
Neutrophils Relative %: 30 %
Platelets: 254 10*3/uL (ref 150–400)
RBC: 4.47 MIL/uL (ref 4.22–5.81)
RDW: 11.9 % (ref 11.5–15.5)
WBC: 5.2 10*3/uL (ref 4.0–10.5)
nRBC: 0 % (ref 0.0–0.2)

## 2020-12-14 LAB — HEMOGLOBIN A1C
Hgb A1c MFr Bld: 8.3 % — ABNORMAL HIGH (ref 4.8–5.6)
Mean Plasma Glucose: 191.51 mg/dL

## 2020-12-14 LAB — BASIC METABOLIC PANEL
Anion gap: 9 (ref 5–15)
BUN: 12 mg/dL (ref 6–20)
CO2: 29 mmol/L (ref 22–32)
Calcium: 9.2 mg/dL (ref 8.9–10.3)
Chloride: 102 mmol/L (ref 98–111)
Creatinine, Ser: 0.84 mg/dL (ref 0.61–1.24)
GFR, Estimated: >60 mL/min (ref >60)
Glucose, Bld: 67 mg/dL — ABNORMAL LOW (ref 70–99)
Potassium: 3.6 mmol/L (ref 3.5–5.1)
Sodium: 140 mmol/L (ref 135–145)

## 2020-12-14 LAB — TSH: TSH: 1.741 u[IU]/mL (ref 0.350–4.500)

## 2020-12-14 LAB — RESP PANEL BY RT-PCR (FLU A&B, COVID) ARPGX2
Influenza A by PCR: NEGATIVE
Influenza B by PCR: NEGATIVE
SARS Coronavirus 2 by RT PCR: NEGATIVE

## 2020-12-14 LAB — CBG MONITORING, ED: Glucose-Capillary: 70 mg/dL (ref 70–99)

## 2020-12-14 LAB — TROPONIN I (HIGH SENSITIVITY)
Troponin I (High Sensitivity): 19 ng/L — ABNORMAL HIGH (ref <18)
Troponin I (High Sensitivity): 19 ng/L — ABNORMAL HIGH (ref <18)

## 2020-12-14 LAB — GLUCOSE, CAPILLARY
Glucose-Capillary: 109 mg/dL — ABNORMAL HIGH (ref 70–99)
Glucose-Capillary: 185 mg/dL — ABNORMAL HIGH (ref 70–99)

## 2020-12-14 LAB — PROTIME-INR
INR: 2.4 — ABNORMAL HIGH (ref 0.8–1.2)
Prothrombin Time: 26.4 seconds — ABNORMAL HIGH (ref 11.4–15.2)

## 2020-12-14 LAB — DIGOXIN LEVEL: Digoxin Level: 0.5 ng/mL — ABNORMAL LOW (ref 0.8–2.0)

## 2020-12-14 LAB — PHENYTOIN LEVEL, TOTAL: Phenytoin Lvl: 3.2 ug/mL — ABNORMAL LOW (ref 10.0–20.0)

## 2020-12-14 LAB — BRAIN NATRIURETIC PEPTIDE: B Natriuretic Peptide: 54 pg/mL (ref 0.0–100.0)

## 2020-12-14 MED ORDER — WARFARIN - PHARMACIST DOSING INPATIENT: Freq: Every day | Status: DC

## 2020-12-14 MED ORDER — SACUBITRIL-VALSARTAN 49-51 MG PO TABS
1.0000 | ORAL_TABLET | Freq: Two times a day (BID) | ORAL | Status: DC
  Filled 2020-12-14 (×2): qty 1

## 2020-12-14 MED ORDER — DIGOXIN 125 MCG PO TABS
0.1250 mg | ORAL_TABLET | Freq: Every day | ORAL | Status: DC
  Administered 2020-12-14 – 2020-12-16 (×3): 0.125 mg via ORAL
  Filled 2020-12-14 (×3): qty 1

## 2020-12-14 MED ORDER — WARFARIN SODIUM 7.5 MG PO TABS
7.5000 mg | ORAL_TABLET | Freq: Once | ORAL | Status: AC
  Administered 2020-12-14: 7.5 mg via ORAL
  Filled 2020-12-14 (×2): qty 1

## 2020-12-14 MED ORDER — ACETAMINOPHEN 325 MG PO TABS
650.0000 mg | ORAL_TABLET | Freq: Four times a day (QID) | ORAL | Status: DC | PRN
  Administered 2020-12-14 – 2020-12-15 (×2): 650 mg via ORAL
  Filled 2020-12-14 (×2): qty 2

## 2020-12-14 MED ORDER — INSULIN ASPART 100 UNIT/ML IJ SOLN: 0.0000 [IU] | Freq: Every day | INTRAMUSCULAR | Status: DC

## 2020-12-14 MED ORDER — ASPIRIN EC 81 MG PO TBEC
81.0000 mg | DELAYED_RELEASE_TABLET | Freq: Every day | ORAL | Status: DC
  Administered 2020-12-15 – 2020-12-16 (×2): 81 mg via ORAL
  Filled 2020-12-14 (×2): qty 1

## 2020-12-14 MED ORDER — PREGABALIN 25 MG PO CAPS
75.0000 mg | ORAL_CAPSULE | Freq: Two times a day (BID) | ORAL | Status: DC
  Administered 2020-12-14 – 2020-12-16 (×4): 75 mg via ORAL
  Filled 2020-12-14 (×4): qty 3

## 2020-12-14 MED ORDER — ACETAMINOPHEN 650 MG RE SUPP: 650.0000 mg | Freq: Four times a day (QID) | RECTAL | Status: DC | PRN

## 2020-12-14 MED ORDER — ACETAMINOPHEN 325 MG PO TABS
650.0000 mg | ORAL_TABLET | Freq: Once | ORAL | Status: AC
  Administered 2020-12-14: 650 mg via ORAL
  Filled 2020-12-14: qty 2

## 2020-12-14 MED ORDER — EZETIMIBE 10 MG PO TABS
10.0000 mg | ORAL_TABLET | Freq: Every day | ORAL | Status: DC
Start: 2020-12-14 — End: 2020-12-16
  Administered 2020-12-15 – 2020-12-16 (×2): 10 mg via ORAL
  Filled 2020-12-14 (×2): qty 1

## 2020-12-14 MED ORDER — ONDANSETRON HCL 4 MG PO TABS: 4.0000 mg | ORAL_TABLET | Freq: Four times a day (QID) | ORAL | Status: DC | PRN

## 2020-12-14 MED ORDER — INSULIN ASPART 100 UNIT/ML IJ SOLN
0.0000 [IU] | Freq: Three times a day (TID) | INTRAMUSCULAR | Status: DC
  Administered 2020-12-15: 2 [IU] via SUBCUTANEOUS
  Administered 2020-12-15: 5 [IU] via SUBCUTANEOUS
  Administered 2020-12-15: 2 [IU] via SUBCUTANEOUS

## 2020-12-14 MED ORDER — METOPROLOL SUCCINATE ER 100 MG PO TB24
200.0000 mg | ORAL_TABLET | Freq: Every day | ORAL | Status: DC
  Administered 2020-12-15 – 2020-12-16 (×2): 200 mg via ORAL
  Filled 2020-12-14 (×2): qty 2

## 2020-12-14 MED ORDER — OXCARBAZEPINE 300 MG PO TABS
600.0000 mg | ORAL_TABLET | Freq: Two times a day (BID) | ORAL | Status: DC
  Administered 2020-12-14 – 2020-12-16 (×4): 600 mg via ORAL
  Filled 2020-12-14 (×4): qty 2

## 2020-12-14 MED ORDER — SODIUM CHLORIDE 0.9% FLUSH
3.0000 mL | Freq: Two times a day (BID) | INTRAVENOUS | Status: DC
  Administered 2020-12-14 – 2020-12-16 (×4): 3 mL via INTRAVENOUS

## 2020-12-14 MED ORDER — SODIUM CHLORIDE 0.9 % IV BOLUS
250.0000 mL | Freq: Once | INTRAVENOUS | Status: AC
  Administered 2020-12-14: 250 mL via INTRAVENOUS

## 2020-12-14 MED ORDER — SPIRONOLACTONE 25 MG PO TABS
25.0000 mg | ORAL_TABLET | Freq: Every day | ORAL | Status: DC
Start: 2020-12-14 — End: 2020-12-14
  Filled 2020-12-14: qty 1

## 2020-12-14 MED ORDER — ATORVASTATIN CALCIUM 80 MG PO TABS
80.0000 mg | ORAL_TABLET | Freq: Every day | ORAL | Status: DC
  Administered 2020-12-15 – 2020-12-16 (×2): 80 mg via ORAL
  Filled 2020-12-14 (×2): qty 1

## 2020-12-14 MED ORDER — PHENYTOIN SODIUM EXTENDED 100 MG PO CAPS
100.0000 mg | ORAL_CAPSULE | Freq: Three times a day (TID) | ORAL | Status: DC
  Administered 2020-12-14 – 2020-12-16 (×6): 100 mg via ORAL
  Filled 2020-12-14 (×6): qty 1

## 2020-12-14 MED ORDER — TRAMADOL HCL 50 MG PO TABS
50.0000 mg | ORAL_TABLET | Freq: Once | ORAL | Status: AC | PRN
  Administered 2020-12-14: 50 mg via ORAL
  Filled 2020-12-14: qty 1

## 2020-12-14 MED ORDER — LEVETIRACETAM 500 MG PO TABS
1500.0000 mg | ORAL_TABLET | Freq: Two times a day (BID) | ORAL | Status: DC
  Administered 2020-12-14 – 2020-12-16 (×4): 1500 mg via ORAL
  Filled 2020-12-14 (×4): qty 3

## 2020-12-14 MED ORDER — ONDANSETRON HCL 4 MG/2ML IJ SOLN: 4.0000 mg | Freq: Four times a day (QID) | INTRAMUSCULAR | Status: DC | PRN

## 2020-12-14 MED ORDER — IVABRADINE HCL 7.5 MG PO TABS
7.5000 mg | ORAL_TABLET | Freq: Two times a day (BID) | ORAL | Status: DC
  Administered 2020-12-14 – 2020-12-16 (×4): 7.5 mg via ORAL
  Filled 2020-12-14 (×7): qty 1

## 2020-12-14 NOTE — Consult Note (Signed)
Cardiology Consultation:   Patient ID: Jason Stevenson MRN: [id number]; DOB: 1985-08-21  Admit date: 12/14/2020 Date of Consult: 12/14/2020  PCP:  Ladell Pier, MD   [facility] Providers Cardiologist:  Jenkins Rouge, MD AHF: Dr. Aundra Dubin    Patient Profile:   Jason Stevenson is a 35 y.o. 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 Haroldine Laws.  History of Present Illness:   Jason Stevenson has had in the past intermittent/sporadic cardiology/HF clinic follow up pver the years. His diagnosis of CM goes back as far as 2008 by notes, no CAD by CTa back in 2014.  Last visit with Dr. Johnsie Cancel was in 2020, his note mentions poorly controlled DM, noncompliance with insulin,  A!c as high as 12.  ICD not discussed historically 2/2 lack of insurance. (?)  I can  not find any prior EP contacts   He presented to [facility] ED 09/11/20 with tingling in left arm, left face, drooling and blank stare for about 1 week, and admitted for possible seizure, HHS and possible CVA.  In ED, he had left-sided neurologic deficit with dysarthria.  CT head with probable subacute infarct in right frontal lobe and old right parietal infarct.  Reportedly, patient's neurologic deficits resolved in ED, and focus shifted to possible seizure versus stroke.  He was loaded with Keppra. MRI brain with probable hemorrhagic infarct involving the anterior right frontal lobe and left parietal occipital cortex with additional chronic right parietal infarct, right frontotemporal region enhancement suggesting changes related to acute seizure, possible toxic insult involving the caudate and lentiform nuclei bilaterally.  Neurology recommended interval follow-up brain MRI.  CTA head and neck without significant finding.  TTE found LVEF 10-15%with LV thrombus  Seen in [facility] ED 09/21/20 for fall after  syncopal episode on toilet, hitting his head. CT of the head was significant for no bleeding but new low-density left cerebellum suggesting acute infarct. EEG unremarkable and felt fall secondary to vagal episode not seizure.    Got re-established with HF clinic June 2022 generally feeling poor, weak, some CP, had [facility] 11/02/20 no CAD, preserved CO RA mean 1 RV 28/1 PA 19/7, mean 12 PCWP mean 3 AO 86/56 Oxygen saturations: PA 99% AO 73% Cardiac Output (Fick) 5.54  Cardiac Index (Fick) 2.92  Saw HF team 12/04/20, still feeling poorly, weak, SOB, meds adjusted/titrated Following with paramedicine now also  TODAY Admitted to [facility] after a syncopal event, had been feeling weaker then usual of late, not eating well, had skipped some meds, his  coumadin, dilantin, or lyrica.  He got up overnight to go to the bathroom, upon standing lightheaded, while urinating fainted.  He was admitted by medicine team, HF has consulted, they noted  Sitting 107/82 Standing 95/67.  His entresto recently increased and poor intake, given IVF felt to be a bit dry.  EP is asked to the case with recurrent syncope, long hx of NICM.  LABS Glucose 67 K+ 3.6 BUN/Creat 12/0.84 HS Trop 19, 19 BNP 54 WBC 5.2 H/H 12/39 Plts 254  INR 2.4 Dig 0.5  CT head with no acute findings   May 31/2022 Hgb A1c >15.5   Past Medical History:  Diagnosis Date   Chronic systolic CHF (congestive heart failure) ([place]) 06/18/2015   Diabetes mellitus without complication (HCC)    Hemorrhagic stroke (HCC)    Hyperlipidemia  Hypertension    Nonischemic cardiomyopathy ([place]) Noted as early as 2007   Per chart review (cards consult note 2011), EF of 40% in 2007, down to 20-25% in 2011    Past Surgical History:  Procedure Laterality Date   None     RIGHT/LEFT HEART CATH AND CORONARY ANGIOGRAPHY N/A 11/02/2020   Procedure: RIGHT/LEFT HEART CATH AND CORONARY ANGIOGRAPHY;  Surgeon: Larey Dresser, MD;  Location: [facility];  Service: Cardiovascular;  Laterality: N/A;     Home Medications:  Prior to Admission medications   Medication Sig Start Date End Date Taking? Authorizing Provider  acetaminophen (TYLENOL) 500 MG tablet Take 1,000 mg by mouth every 8 (eight) hours as needed for mild pain, fever or headache.   Yes [provider]  aspirin EC 81 MG tablet Take 81 mg by mouth daily. Swallow whole.   Yes [provider]  atorvastatin (LIPITOR) 80 MG tablet Take 1 tablet (80 mg total) by mouth daily. 10/09/20  Yes Milford, Maricela Bo, FNP  digoxin (LANOXIN) 0.125 MG tablet Take 1 tablet (0.125 mg total) by mouth daily. 10/09/20 12/14/20 Yes Milford, Maricela Bo, FNP  ezetimibe (ZETIA) 10 MG tablet Take 1 tablet (10 mg total) by mouth daily. 10/09/20 01/10/21 Yes Milford, Maricela Bo, FNP  insulin isophane & regular human KwikPen (NOVOLIN 70/30 KWIKPEN) (70-30) 100 UNIT/ML KwikPen 22 units SQ Q a.m and 18 units Q p.m Patient taking differently: Inject 18-22 Units into the skin See admin instructions. 22 units in the mornig and 18 units in the evening 10/27/20  Yes Ladell Pier, MD  ivabradine (CORLANOR) 7.5 MG TABS tablet Take 1 tablet (7.5 mg total) by mouth 2 (two) times daily with a meal. Patient taking differently: Take 11.25 mg by mouth 2 (two) times daily with a meal. 10/23/20  Yes Milford, Maricela Bo, FNP  levETIRAcetam (KEPPRA) 750 MG tablet Take 2 tablets (1,500 mg total) by mouth 2 (two) times daily. 10/14/20  Yes Ladell Pier, MD  Menthol, Topical Analgesic, (ICY HOT BACK EX) Apply 1 application topically as needed (neuropathy). Icy hot cream   Yes [provider]  metFORMIN (GLUCOPHAGE) 500 MG tablet Take 1 tablet (500 mg total) by mouth 2 (two) times daily with a meal. 10/18/20  Yes Ladell Pier, MD  metoprolol (TOPROL-XL) 200 MG 24 hr tablet Take 1 tablet (200 mg total) by mouth daily. Take with or immediately following a meal. 10/09/20 12/14/20 Yes Milford, Maricela Bo, FNP   Oxcarbazepine (TRILEPTAL) 300 MG tablet Take 2 tablets (600 mg total) by mouth 2 (two) times daily. 10/14/20  Yes Ladell Pier, MD  phenytoin (DILANTIN) 100 MG ER capsule Take 1 capsule (100 mg total) by mouth 3 (three) times daily. 10/14/20  Yes Ladell Pier, MD  pregabalin (LYRICA) 75 MG capsule Take 1 capsule (75 mg total) by mouth 2 (two) times daily. 11/29/20  Yes Ladell Pier, MD  sacubitril-valsartan (ENTRESTO) 49-51 MG Take 1 tablet by mouth 2 (two) times daily. 12/04/20  Yes Milford, Maricela Bo, FNP  spironolactone (ALDACTONE) 25 MG tablet Take 1 tablet (25 mg total) by mouth daily. 11/09/20  Yes Larey Dresser, MD  warfarin (COUMADIN) 5 MG tablet Take 1.5 tablets (7.5 mg total) by mouth daily. Patient taking differently: Take 5-7.5 mg by mouth See admin instructions. 7.76m on Tuesday Thursday Saturday 530mon Monday, Wednesday, Friday, Sunday 11/13/20  Yes McLarey DresserMD  Blood Glucose Monitoring Suppl (TRUE METRIX METER) w/Device  KIT 1 each by Does not apply route 3 (three) times daily. 11/29/20   Ladell Pier, MD  Blood Pressure Monitor DEVI Use as directed to check home blood pressure 2-3 times a week 10/12/18   Ladell Pier, MD  glucose blood (TRUE METRIX BLOOD GLUCOSE TEST) test strip Use as instructed 11/29/20   Ladell Pier, MD  Insulin Pen Needle (TRUEPLUS 5-BEVEL PEN NEEDLES) 32G X 4 MM MISC Use in the morning, at noon, and at bedtime. 11/21/20   Charlott Rakes, MD  Insulin Syringe-Needle U-100 (RELION INSULIN SYRINGE) 31G X 15/64" 0.3 ML MISC Use to inject insulin daily. 07/13/18   Ladell Pier, MD  TRUEplus Lancets 28G MISC Use as directed 11/29/20   Ladell Pier, MD    Inpatient Medications: Scheduled Meds:  digoxin  0.125 mg Oral Daily   ivabradine  7.5 mg Oral BID WC   Continuous Infusions:  PRN Meds:   Allergies:   No Known Allergies  Social History:   Social History   Socioeconomic History   Marital status: Married     Spouse name: Not on file   Number of children: Not on file   Years of education: Not on file   Highest education level: Not on file  Occupational History   Occupation: unemployed  Tobacco Use   Smoking status: Never   Smokeless tobacco: Never  Vaping Use   Vaping Use: Not on file  Substance and Sexual Activity   Alcohol use: Not Currently    Comment: "occasional" when "hanging out with the wrong people" No recent use.   Drug use: Not Currently    Types: Marijuana    Comment: occasional, last 2013   Sexual activity: Not Currently  Other Topics Concern   Not on file  Social History Narrative   Pt lives alone. Has no information on father.   Social Determinants of Health   Financial Resource Strain: High Risk   Difficulty of Paying Living Expenses: Hard  Food Insecurity: No Food Insecurity   Worried About Charity fundraiser in the Last Year: Never true   Ran Out of Food in the Last Year: Never true  Transportation Needs: Unmet Transportation Needs   Lack of Transportation (Medical): No   Lack of Transportation (Non-Medical): Yes  Physical Activity: Not on file  Stress: Not on file  Social Connections: Not on file  Intimate Partner Violence: Not on file    Family History:  Family History  Problem Relation Age of Onset   Stroke Mother    Diabetes Mother    Hypertension Mother    Stroke Maternal Aunt    Heart attack Neg Hx      ROS:  Please see the history of present illness.  All other ROS reviewed and negative.     Physical Exam/Data:   Vitals:   12/14/20 0900 12/14/20 1000 12/14/20 1029 12/14/20 1100  BP: 102/74 108/77  119/89  Pulse: 100 98 99 94  Resp: '17 18  15  ' Temp:      TempSrc:      SpO2: 100% 98%  100%    Intake/Output Summary (Last 24 hours) at 12/14/2020 1233 Last data filed at 12/14/2020 1145 Gross per 24 hour  Intake 250 ml  Output 2000 ml  Net -1750 ml   Last 3 Weights 12/11/2020 12/04/2020 11/27/2020  Weight (lbs) 153 lb 156 lb 156 lb   Weight (kg) 69.4 kg 70.761 kg 70.761 kg     There  is no height or weight on file to calculate BMI.  General:  Well nourished, well developed, in no acute distress HEENT: normal Lymph: no adenopathy Neck: no JVD Endocrine:  No thryomegaly Vascular: No carotid bruits  Cardiac:  RRR; no murmurs, gallops or rubs Lungs:  CTA b/l, no wheezing, rhonchi or rales  Abd: soft, nontender  Ext: no edema Musculoskeletal:  No deformities, BUE and BLE strength normal and equal Skin: warm and dry  Neuro:  no focal abnormalities noted Psych:  Normal affect   EKG:  The EKG was personally reviewed and demonstrates:    SR 99bpm, PVC, icRBBB, lat T changes (unchanged from old)  Telemetry:  Telemetry was personally reviewed and demonstrates:   SR 90s, occ PVCs, infrequent couplet  Relevant CV Studies:    [facility] 11/02/20  no CAD, preserved CO RA mean 1 RV 28/1 PA 19/7, mean 12 PCWP mean 3 AO 86/56 Oxygen saturations: PA 99% AO 73% Cardiac Output (Fick) 5.54  Cardiac Index (Fick) 2.92   09/13/2020: TTE IMPRESSIONS   1. 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   2. Left ventricular ejection fraction, by estimation, is 10-15%. The left  ventricle has severely decreased function. The left ventricle demonstrates  global hypokinesis. The left ventricular internal cavity size was mildly  dilated. Left ventricular  diastolic parameters are indeterminate.   3. Right ventricular systolic function is moderately reduced. The right  ventricular size is normal. Tricuspid regurgitation signal is inadequate  for assessing PA pressure.   4. Left atrial size was mildly dilated.   5. The mitral valve is grossly normal. Mild mitral valve regurgitation.  No evidence of mitral stenosis.   6. The aortic valve is tricuspid. Aortic valve regurgitation is not  visualized. No aortic stenosis is present.   7. The inferior vena cava is dilated in size with <50% respiratory  variability, suggesting right  atrial pressure of 15 mmHg.   Laboratory Data:  High Sensitivity Troponin:   Recent Labs  Lab 12/13/20 1110 12/14/20 0546 12/14/20 0729  TROPONINIHS 18* 19* 19*     Chemistry Recent Labs  Lab 12/13/20 1110 12/14/20 0314  NA 140 140  K 3.8 3.6  CL 103 102  CO2 29 29  GLUCOSE 128* 67*  BUN 13 12  CREATININE 0.85 0.84  CALCIUM 9.2 9.2  GFRNONAA >60 >60  ANIONGAP 8 9    No results for input(s): PROT, ALBUMIN, AST, ALT, ALKPHOS, BILITOT in the last 168 hours. Hematology Recent Labs  Lab 12/14/20 0314  WBC 5.2  RBC 4.47  HGB 12.9*  HCT 39.7  MCV 88.8  MCH 28.9  MCHC 32.5  RDW 11.9  PLT 254   BNP Recent Labs  Lab 12/14/20 0314  BNP 54.0    DDimer No results for input(s): DDIMER in the last 168 hours.   Radiology/Studies:  DG Chest 2 View  Result Date: 12/14/2020 CLINICAL DATA:  Syncope.  History of ischemic cardiomyopathy EXAM: CHEST - 2 VIEW COMPARISON:  08/14/2020 FINDINGS: Normal heart size and mediastinal contours. No acute infiltrate or edema. No effusion or pneumothorax. No acute osseous findings. IMPRESSION: No active cardiopulmonary disease. Electronically Signed   By: Monte Fantasia M.D.   On: 12/14/2020 07:05   CT HEAD WO CONTRAST (5MM)  Result Date: 12/14/2020 CLINICAL DATA:  Facial trauma. EXAM: CT HEAD WITHOUT CONTRAST CT MAXILLOFACIAL WITHOUT CONTRAST TECHNIQUE: Multidetector CT imaging of the head and maxillofacial structures were performed using the standard protocol without intravenous contrast. Multiplanar  CT image reconstructions of the maxillofacial structures were also generated. COMPARISON:  Brain MRI dated 09/21/2020.  Head CT dated 09/21/2020. FINDINGS: CT HEAD FINDINGS Brain: Old right frontal and parietal infarcts and encephalomalacia. The ventricles and sulci are otherwise appropriate size for patient's age. There is no acute intracranial hemorrhage. No mass effect or midline shift. No extra-axial fluid collection. Vascular: No hyperdense  vessel or unexpected calcification. Skull: Normal. Negative for fracture or focal lesion. Other: None. CT MAXILLOFACIAL FINDINGS Osseous: No fracture or mandibular dislocation. No destructive process. Orbits: The globes and retro-orbital fat are preserved. Sinuses: Clear. Soft tissues: Negative. IMPRESSION: 1. No acute intracranial pathology. 2. Old right frontal and parietal infarcts and encephalomalacia. 3. No acute facial bone fractures. Electronically Signed   By: Anner Crete M.D.   On: 12/14/2020 03:46   CT Maxillofacial Wo Contrast  Result Date: 12/14/2020 CLINICAL DATA:  Facial trauma. EXAM: CT HEAD WITHOUT CONTRAST CT MAXILLOFACIAL WITHOUT CONTRAST TECHNIQUE: Multidetector CT imaging of the head and maxillofacial structures were performed using the standard protocol without intravenous contrast. Multiplanar CT image reconstructions of the maxillofacial structures were also generated. COMPARISON:  Brain MRI dated 09/21/2020.  Head CT dated 09/21/2020. FINDINGS: CT HEAD FINDINGS Brain: Old right frontal and parietal infarcts and encephalomalacia. The ventricles and sulci are otherwise appropriate size for patient's age. There is no acute intracranial hemorrhage. No mass effect or midline shift. No extra-axial fluid collection. Vascular: No hyperdense vessel or unexpected calcification. Skull: Normal. Negative for fracture or focal lesion. Other: None. CT MAXILLOFACIAL FINDINGS Osseous: No fracture or mandibular dislocation. No destructive process. Orbits: The globes and retro-orbital fat are preserved. Sinuses: Clear. Soft tissues: Negative. IMPRESSION: 1. No acute intracranial pathology. 2. Old right frontal and parietal infarcts and encephalomalacia. 3. No acute facial bone fractures. Electronically Signed   By: Anner Crete M.D.   On: 12/14/2020 03:46     Assessment and Plan:   Syncope Does not sound arrhythmic, likely as felt by HF team, orthostatic/volume depleted.  Dr. Quentin Ore saw the  patient Patient reports good compliance with his medicines including his DM management He reports that his previously inconsistent follow up 2/2 lack of insurance only.  He sounds very motivated, has been in the process of getting medicaid, as far as he knows, has not yet come through.  Hgb A1c is ordered, will ask social services to look into his insurance status. These both impact ICD decision    Risk Assessment/Risk Scores:    For questions or updates, please contact [place] Please consult [email or web address] for contact info under    Signed, Baldwin Jamaica, PA-C  12/14/2020 12:33 PM

## 2020-12-14 NOTE — ED Notes (Signed)
I messaged Dr. Ophelia Charter about an episode of chest pain pt had at 1645. Here is message. Pt had an episode of chest pain at 1645. It lasted 2 seconds. Said he was sitting up in bed and it was sharp on the L side of chest. Resolved shortly after. Denies nausea, shortness of breath or any other sx with it. I looked at his cardiac review and there was a couple of PVC's at the time, but there was multiple normal beats in between them. Waiting for response.

## 2020-12-14 NOTE — Progress Notes (Addendum)
ANTICOAGULATION CONSULT NOTE - Initial Consult  Pharmacy Consult for warfarin Indication:  LV thrombus  No Known Allergies  Patient Measurements:    Vital Signs: Temp: 98.9 F (37.2 C) (09/01 0433) Temp Source: Oral (09/01 0433) BP: 119/89 (09/01 1100) Pulse Rate: 94 (09/01 1100)  Labs: Recent Labs    12/13/20 1110 12/14/20 0314 12/14/20 0546 12/14/20 0700 12/14/20 0729  HGB  --  12.9*  --   --   --   HCT  --  39.7  --   --   --   PLT  --  254  --   --   --   LABPROT  --   --   --  26.4*  --   INR  --   --   --  2.4*  --   CREATININE 0.85 0.84  --   --   --   TROPONINIHS 18*  --  19*  --  19*    Estimated Creatinine Clearance: 118.2 mL/min (by C-G formula based on SCr of 0.84 mg/dL).   Medical History: Past Medical History:  Diagnosis Date   Chronic systolic CHF (congestive heart failure) (HCC) 06/18/2015   Diabetes mellitus without complication (HCC)    Hemorrhagic stroke (HCC)    Hyperlipidemia    Hypertension    Nonischemic cardiomyopathy ([facility]) Noted as early as 2007   Per chart review (cards consult note 2011), EF of 40% in 2007, down to 20-25% in 2011    Medications:  (Not in a hospital admission)   Assessment: 85 YOM who presented with syncopal episode on warfarin at home for h/o CVA secondary to LV thrombus. On warfarin at home. Pharmacy consulted to resume warfarin.   Hgb mildly low, Plt wnl. INR therapeutic at 2.4 today   Home warfarin regimen: 7.5 mg on Tue/Thu/Sat, 5 mg on all other days   Goal of Therapy:  INR 2-3 Monitor platelets by anticoagulation protocol: Yes   Plan:  -Warfarin 7.5 mg x 1 dose today  -Monitor daily PT/INR and s/s of bleeding   Vinnie Level, PharmD., BCPS, BCCCP Clinical Pharmacist Please refer to [facility] for unit-specific pharmacist

## 2020-12-14 NOTE — Procedures (Signed)
Patient Name: Jason Stevenson  MRN: [id number]  Epilepsy Attending: Charlsie Quest  Referring Physician/Provider: Dr [name] Date: 12/14/2020 Duration: 23.29 mins  Patient history: 35yo m with h/o presented with syncope. EEG to evaluate for seizure.  Level of alertness: Awake  AEDs during EEG study: LEV, OXC, PHT, PGB  Technical aspects: This EEG study was done with scalp electrodes positioned according to the 10-20 International system of electrode placement. Electrical activity was acquired at a sampling rate of 500Hz  and reviewed with a high frequency filter of 70Hz  and a low frequency filter of 1Hz . EEG data were recorded continuously and digitally stored.   Description: The posterior dominant rhythm consists of 9-10 Hz activity of moderate voltage (25-35 uV) seen predominantly in posterior head regions, symmetric and reactive to eye opening and eye closing. Physiologic photic driving was seen during photic stimulation.  Hyperventilation was not performed.     IMPRESSION: This study is within normal limits. No seizures or epileptiform discharges were seen throughout the recording.  [name] 

## 2020-12-14 NOTE — ED Provider Notes (Signed)
[facility] EMERGENCY DEPARTMENT Provider Note   CSN: [id number] Arrival date & time: 12/14/20  0257     History Chief Complaint  Patient presents with   Loss of Consciousness    Ledell Codrington is a 35 y.o. male presents to the emergency department after episode of syncope around 2:30 AM.  Patient reports he got up out of bed and felt somewhat lightheaded, ambulated to the bathroom without difficulty and began to urinate.  While urinating he became lightheaded and had a syncopal episode striking his head on the pedestal sink.  Patient reports the last he remembers is waking up on the floor.  He is anticoagulated on Coumadin.  Reports he did not take his night medications last night.  Patient reports only one other episode of syncope and that was earlier in the year.  Also reports that he was recently admitted for seizure activity and is now taking Dilantin.  He did not take his Dilantin last night either.  Patient wife heard him fall and went to check on him.  Denied seizure-like activity.  Patient reports he feels somewhat "off" but cannot describe the feeling.  No known aggravating or alleviating factors.   The history is provided by the patient and medical records. No language interpreter was used.      Past Medical History:  Diagnosis Date   Chronic systolic CHF (congestive heart failure) ([place]) 06/18/2015   Diabetes mellitus without complication ([place])    Hemorrhagic stroke ([place])    Hyperlipidemia    Hypertension    Nonischemic cardiomyopathy ([place]) Noted as early as 2007   Per chart review (cards consult note 2011), EF of 40% in 2007, down to 20-25% in 2011    Patient Active Problem List   Diagnosis Date Noted   Chest pain 10/30/2020   History of cerebrovascular accident (CVA) with residual deficit 10/27/2020   Protein-calorie malnutrition, severe 09/22/2020   Acute CVA (cerebrovascular accident) ([place]) 09/21/2020   Near syncope 09/21/2020   Encounter for  monitoring Coumadin therapy 09/20/2020   LV (left ventricular) mural thrombus 09/13/2020   Seizure ([place]) 09/13/2020   Neurological deficit present 09/12/2020   History of COVID-19 09/12/2020   Intracerebral hemorrhage 09/12/2020   Acute cerebrovascular accident (CVA) ([place]) 09/12/2020   Hyperosmolar hyperglycemic state (HHS) ([place]) 09/11/2020   COVID-19 08/23/2020   Noncompliance with medication treatment due to intermittent use of medication 10/12/2018   Dyslipidemia 04/17/2016   Uncontrolled type 2 diabetes mellitus without complication, with long-term current use of insulin 50/53/9767   Chronic systolic CHF (congestive heart failure) ([place]) 06/18/2015   Needs flu shot 12/23/2013   Type 2 diabetes mellitus ([place]) 11/18/2013   Diabetes ([place]) 06/17/2013   Non-ischemic cardiomyopathy ([place]) 11/06/2012   HTN (hypertension) 11/06/2012   HLD (hyperlipidemia) 11/06/2012    Past Surgical History:  Procedure Laterality Date   None     RIGHT/LEFT HEART CATH AND CORONARY ANGIOGRAPHY N/A 11/02/2020   Procedure: RIGHT/LEFT HEART CATH AND CORONARY ANGIOGRAPHY;  Surgeon: Larey Dresser, MD;  Location: [facility];  Service: Cardiovascular;  Laterality: N/A;       Family History  Problem Relation Age of Onset   Stroke Mother    Diabetes Mother    Hypertension Mother    Stroke Maternal Aunt    Heart attack Neg Hx     Social History   Tobacco Use   Smoking status: Never   Smokeless tobacco: Never  Substance Use Topics   Alcohol use: Not  Currently    Comment: "occasional" when "hanging out with the wrong people" No recent use.   Drug use: Not Currently    Types: Marijuana    Comment: occasional, last 2013    Home Medications Prior to Admission medications   Medication Sig Start Date End Date Taking? Authorizing Provider  acetaminophen (TYLENOL) 500 MG tablet Take 1,000 mg by mouth at bedtime.    [provider]  atorvastatin (LIPITOR) 80 MG tablet Take 1 tablet (80  mg total) by mouth daily. 10/09/20   Rafael Bihari, FNP  Blood Glucose Monitoring Suppl (TRUE METRIX METER) w/Device KIT 1 each by Does not apply route 3 (three) times daily. 11/29/20   Ladell Pier, MD  Blood Pressure Monitor DEVI Use as directed to check home blood pressure 2-3 times a week 10/12/18   Ladell Pier, MD  digoxin (LANOXIN) 0.125 MG tablet Take 1 tablet (0.125 mg total) by mouth daily. 10/09/20 12/12/20  Rafael Bihari, FNP  enoxaparin (LOVENOX) 100 MG/ML injection Inject 1 syringe into the skin daily for 5 days. Patient not taking: No sig reported 11/02/20 12/04/21  Larey Dresser, MD  ezetimibe (ZETIA) 10 MG tablet Take 1 tablet (10 mg total) by mouth daily. 10/09/20 01/10/21  Rafael Bihari, FNP  furosemide (LASIX) 20 MG tablet Take 1 tablet (20 mg total) by mouth as needed (3 lb weight gain over night or 5 lb weight gain in one week). 12/04/20 01/03/21  Rafael Bihari, FNP  glucose blood (TRUE METRIX BLOOD GLUCOSE TEST) test strip Use as instructed 11/29/20   Ladell Pier, MD  insulin isophane & regular human KwikPen (NOVOLIN 70/30 KWIKPEN) (70-30) 100 UNIT/ML KwikPen 22 units SQ Q a.m and 18 units Q p.m 10/27/20   Ladell Pier, MD  Insulin Pen Needle (TRUEPLUS 5-BEVEL PEN NEEDLES) 32G X 4 MM MISC Use in the morning, at noon, and at bedtime. 11/21/20   Charlott Rakes, MD  Insulin Syringe-Needle U-100 (RELION INSULIN SYRINGE) 31G X 15/64" 0.3 ML MISC Use to inject insulin daily. 07/13/18   Ladell Pier, MD  ivabradine (CORLANOR) 7.5 MG TABS tablet Take 1 tablet (7.5 mg total) by mouth 2 (two) times daily with a meal. 10/23/20   [place], Maricela Bo, FNP  levETIRAcetam (KEPPRA) 750 MG tablet Take 2 tablets (1,500 mg total) by mouth 2 (two) times daily. 10/14/20   Ladell Pier, MD  metFORMIN (GLUCOPHAGE) 500 MG tablet Take 1 tablet (500 mg total) by mouth 2 (two) times daily with a meal. 10/18/20   Ladell Pier, MD  metoprolol (TOPROL-XL) 200 MG  24 hr tablet Take 1 tablet (200 mg total) by mouth daily. Take with or immediately following a meal. 10/09/20 12/12/20  [place], Maricela Bo, FNP  Oxcarbazepine (TRILEPTAL) 300 MG tablet Take 2 tablets (600 mg total) by mouth 2 (two) times daily. 10/14/20   Ladell Pier, MD  phenytoin (DILANTIN) 100 MG ER capsule Take 1 capsule (100 mg total) by mouth 3 (three) times daily. 10/14/20   Ladell Pier, MD  pregabalin (LYRICA) 75 MG capsule Take 1 capsule (75 mg total) by mouth 2 (two) times daily. 11/29/20   Ladell Pier, MD  sacubitril-valsartan (ENTRESTO) 49-51 MG Take 1 tablet by mouth 2 (two) times daily. 12/04/20   Rafael Bihari, FNP  spironolactone (ALDACTONE) 25 MG tablet Take 1 tablet (25 mg total) by mouth daily. 11/09/20   Larey Dresser, MD  TRUEplus Lancets 28G MISC  Use as directed 11/29/20   Ladell Pier, MD  warfarin (COUMADIN) 5 MG tablet Take 1.5 tablets (7.5 mg total) by mouth daily. 11/13/20   Larey Dresser, MD    Allergies    Patient has no known allergies.  Review of Systems   Review of Systems  Constitutional:  Negative for appetite change, diaphoresis, fatigue, fever and unexpected weight change.  HENT:  Negative for mouth sores.   Eyes:  Negative for visual disturbance.  Respiratory:  Negative for cough, chest tightness, shortness of breath and wheezing.   Cardiovascular:  Negative for chest pain.  Gastrointestinal:  Negative for abdominal pain, constipation, diarrhea, nausea and vomiting.  Endocrine: Negative for polydipsia, polyphagia and polyuria.  Genitourinary:  Negative for dysuria, frequency, hematuria and urgency.  Musculoskeletal:  Negative for back pain and neck stiffness.  Skin:  Negative for rash.  Allergic/Immunologic: Negative for immunocompromised state.  Neurological:  Positive for dizziness, syncope and light-headedness. Negative for headaches.  Hematological:  Does not bruise/bleed easily.  Psychiatric/Behavioral:  Negative for  sleep disturbance. The patient is not nervous/anxious.    Physical Exam Updated Vital Signs BP 93/67   Pulse 95   Temp 98.9 F (37.2 C) (Oral)   Resp 18   SpO2 100%   Physical Exam Vitals and nursing note reviewed.  Constitutional:      General: He is not in acute distress.    Appearance: He is not diaphoretic.  HENT:     Head: Normocephalic and atraumatic.  Eyes:     General: No scleral icterus.    Conjunctiva/sclera: Conjunctivae normal.  Cardiovascular:     Rate and Rhythm: Normal rate and regular rhythm.     Pulses: Normal pulses.          Radial pulses are 2+ on the right side and 2+ on the left side.  Pulmonary:     Effort: Pulmonary effort is normal. No tachypnea, accessory muscle usage, prolonged expiration, respiratory distress or retractions.     Breath sounds: Normal breath sounds. No stridor.     Comments: Equal chest rise. No increased work of breathing. Abdominal:     General: There is no distension.     Palpations: Abdomen is soft.     Tenderness: There is no abdominal tenderness. There is no guarding or rebound.  Musculoskeletal:        General: Normal range of motion.     Cervical back: Normal range of motion.     Comments: Moves all extremities equally and without difficulty.  Skin:    General: Skin is warm and dry.     Capillary Refill: Capillary refill takes less than 2 seconds.  Neurological:     Mental Status: He is alert.     GCS: GCS eye subscore is 4. GCS verbal subscore is 5. GCS motor subscore is 6.     Comments: Speech is clear and goal oriented.  Psychiatric:        Mood and Affect: Mood normal.    ED Results / Procedures / Treatments   Labs (all labs ordered are listed, but only abnormal results are displayed) Labs Reviewed  CBC WITH DIFFERENTIAL/PLATELET - Abnormal; Notable for the following components:      Result Value   Hemoglobin 12.9 (*)    Neutro Abs 1.5 (*)    All other components within normal limits  BASIC METABOLIC  PANEL - Abnormal; Notable for the following components:   Glucose, Bld 67 (*)    All  other components within normal limits  TROPONIN I (HIGH SENSITIVITY) - Abnormal; Notable for the following components:   Troponin I (High Sensitivity) 19 (*)    All other components within normal limits  BRAIN NATRIURETIC PEPTIDE  PROTIME-INR  DIGOXIN LEVEL  CBG MONITORING, ED  CBG MONITORING, ED    EKG EKG Interpretation  Date/Time:  Thursday December 14 2020 03:00:17 EDT Ventricular Rate:  99 PR Interval:  144 QRS Duration: 110 QT Interval:  356 QTC Calculation: 456 R Axis:   121 Text Interpretation: Sinus rhythm with Premature atrial complexes with Abberant conduction Right atrial enlargement Right axis deviation Pulmonary disease pattern T wave abnormality, consider lateral ischemia Abnormal ECG Otherwise no significant change Confirmed by Addison Lank [id number]) on 12/14/2020 5:35:53 AM  Radiology CT HEAD WO CONTRAST (5MM)  Result Date: 12/14/2020 CLINICAL DATA:  Facial trauma. EXAM: CT HEAD WITHOUT CONTRAST CT MAXILLOFACIAL WITHOUT CONTRAST TECHNIQUE: Multidetector CT imaging of the head and maxillofacial structures were performed using the standard protocol without intravenous contrast. Multiplanar CT image reconstructions of the maxillofacial structures were also generated. COMPARISON:  Brain MRI dated 09/21/2020.  Head CT dated 09/21/2020. FINDINGS: CT HEAD FINDINGS Brain: Old right frontal and parietal infarcts and encephalomalacia. The ventricles and sulci are otherwise appropriate size for patient's age. There is no acute intracranial hemorrhage. No mass effect or midline shift. No extra-axial fluid collection. Vascular: No hyperdense vessel or unexpected calcification. Skull: Normal. Negative for fracture or focal lesion. Other: None. CT MAXILLOFACIAL FINDINGS Osseous: No fracture or mandibular dislocation. No destructive process. Orbits: The globes and retro-orbital fat are preserved. Sinuses:  Clear. Soft tissues: Negative. IMPRESSION: 1. No acute intracranial pathology. 2. Old right frontal and parietal infarcts and encephalomalacia. 3. No acute facial bone fractures. Electronically Signed   By: Anner Crete M.D.   On: 12/14/2020 03:46   CT Maxillofacial Wo Contrast  Result Date: 12/14/2020 CLINICAL DATA:  Facial trauma. EXAM: CT HEAD WITHOUT CONTRAST CT MAXILLOFACIAL WITHOUT CONTRAST TECHNIQUE: Multidetector CT imaging of the head and maxillofacial structures were performed using the standard protocol without intravenous contrast. Multiplanar CT image reconstructions of the maxillofacial structures were also generated. COMPARISON:  Brain MRI dated 09/21/2020.  Head CT dated 09/21/2020. FINDINGS: CT HEAD FINDINGS Brain: Old right frontal and parietal infarcts and encephalomalacia. The ventricles and sulci are otherwise appropriate size for patient's age. There is no acute intracranial hemorrhage. No mass effect or midline shift. No extra-axial fluid collection. Vascular: No hyperdense vessel or unexpected calcification. Skull: Normal. Negative for fracture or focal lesion. Other: None. CT MAXILLOFACIAL FINDINGS Osseous: No fracture or mandibular dislocation. No destructive process. Orbits: The globes and retro-orbital fat are preserved. Sinuses: Clear. Soft tissues: Negative. IMPRESSION: 1. No acute intracranial pathology. 2. Old right frontal and parietal infarcts and encephalomalacia. 3. No acute facial bone fractures. Electronically Signed   By: Anner Crete M.D.   On: 12/14/2020 03:46    Procedures Procedures   Medications Ordered in ED Medications - No data to display  ED Course  I have reviewed the triage vital signs and the nursing notes.  Pertinent labs & imaging results that were available during my care of the patient were reviewed by me and considered in my medical decision making (see chart for details).    MDM Rules/Calculators/A&P                            Presents to the emergency department after syncopal episode.  He  has a history of ischemic cardiomyopathy with an ejection fraction of 10%.  He is followed by the heart failure clinic and [facility] emergency services community paramedics.  Patient did not take his medications last night but reports he has otherwise been compliant.  Blood sugar on arrival upper 60s.  On recheck 40.  He has been given orange juice to drink.  Wife at bedside reports patient's blood sugars during the day ranged from 80-160.  Do not suspect syncope was secondary to hypoglycemic episode.  Given high risk syncope with low ejection fraction patient will need admission after emergency department work-up.  6:56 AM Patient signed out to the oncoming crew for admission and discussion with cardiology.  BP 93/67   Pulse 95   Temp 98.9 F (37.2 C) (Oral)   Resp 18   SpO2 100%    Final Clinical Impression(s) / ED Diagnoses Final diagnoses:  Syncope and collapse  Ischemic cardiomyopathy    Rx / DC Orders ED Discharge Orders     None        Loni Muse Gwenlyn Perking 12/14/20 0160    Fatima Blank, MD 12/14/20 [phone]

## 2020-12-14 NOTE — ED Notes (Signed)
Attempted to call report again.

## 2020-12-14 NOTE — ED Provider Notes (Signed)
Assumed care at change of shift, see prior note for complete H&P.  Physical Exam  BP 119/89   Pulse 94   Temp 98.9 F (37.2 C) (Oral)   Resp 15   SpO2 100%   Physical Exam  ED Course/Procedures     Procedures  MDM  BNP within normal limits.  Cardiology consulted and will see the patient. Discussed results and plan of care with patient who is inquiring about his morning medications.  11:16am patient has been seen by cardiology who recommends admission, requests hospitalist service admit.  11:45am discussed with Dr. Ophelia Charter with [facility] hospitalist service will consult for admission.   Jeannie Fend, PA-C 12/14/20 1145    Terald Sleeper, MD 12/14/20 1729

## 2020-12-14 NOTE — ED Triage Notes (Signed)
Brought in by [facility] from home - c/o syncope - pt woke up to go to bathroom and felt like he was going to pass out. Next thing pt remembers is waking up on the floor (bathroom) . Hx of seizures, stroke and CHF. Pt on warfarin. Reports of right sided facial pain.    Bgl 94 bp 112/70

## 2020-12-14 NOTE — ED Notes (Signed)
Pt aware no new orders from doctor for cardiac episode.  Pt taken up to floor via RN via stretcher via monitor.

## 2020-12-14 NOTE — ED Notes (Signed)
I told pt I attempted report and messaged Dr. Ophelia Charter. No further needs.  EEG at bedside finishing up.

## 2020-12-14 NOTE — H&P (Addendum)
History and Physical    [name] XFG:[id number] DOB: [birth year] DOA: 12/14/2020  PCP: Ladell Pier, MD Consultants:  Aundra Dubin - cardiology Patient coming from:  Home - lives with ; [facility]: Wife, Champ Keetch, [phone]  Chief Complaint: Syncope  HPI: Jason Stevenson is a 35 y.o. male with medical history significant of chronic systolic CHF (EF 75-10% with LV thrombus); DM; HTN; hemorrhagic stroke; seizure activity confirmed on EEG, on Keppra, Dilantin, and Trileptal; and HLD presenting with syncope.  He woke up about 0200 to void.  As he was finishing, he felt light-headed and like his heart was palpitating.  He "fell out" and found himself in the floor upon awakening.  He hit head on the pedestal sink, felt like someone punched him in the eye.  Unsure if true syncope or near syncope, remembers episode.  No bleeding.  He went to a party yesterday, didn't feel all that great, maybe not breathing so well like after someone has been running.  No recent illness.    ED Course: Syncope last night.  On Coumadin, hit head but no trauma.  EP team to evaluate and adjust meds.  Review of Systems: As per HPI; otherwise review of systems reviewed and negative.   Ambulatory Status:  Ambulates without assistance  COVID Vaccine Status:  Complete  Past Medical History:  Diagnosis Date   Chronic systolic CHF (congestive heart failure) ([place]) 06/18/2015   Diabetes mellitus without complication ([place])    Hemorrhagic stroke ([place])    Hyperlipidemia    Hypertension    Nonischemic cardiomyopathy ([place]) Noted as early as 2007   Per chart review (cards consult note 2011), EF of 40% in 2007, down to 20-25% in 2011    Past Surgical History:  Procedure Laterality Date   None     RIGHT/LEFT HEART CATH AND CORONARY ANGIOGRAPHY N/A 11/02/2020   Procedure: RIGHT/LEFT HEART CATH AND CORONARY ANGIOGRAPHY;  Surgeon: Larey Dresser, MD;  Location: [facility];  Service: Cardiovascular;   Laterality: N/A;    Social History   Socioeconomic History   Marital status: Married    Spouse name: Not on file   Number of children: Not on file   Years of education: Not on file   Highest education level: Not on file  Occupational History   Occupation: unemployed  Tobacco Use   Smoking status: Never   Smokeless tobacco: Never  Vaping Use   Vaping Use: Not on file  Substance and Sexual Activity   Alcohol use: Not Currently    Comment: "occasional" when "hanging out with the wrong people" No recent use.   Drug use: Not Currently    Types: Marijuana    Comment: occasional, last 2013   Sexual activity: Not Currently  Other Topics Concern   Not on file  Social History Narrative   Pt lives alone. Has no information on father.   Social Determinants of Health   Financial Resource Strain: High Risk   Difficulty of Paying Living Expenses: Hard  Food Insecurity: No Food Insecurity   Worried About Charity fundraiser in the Last Year: Never true   Ran Out of Food in the Last Year: Never true  Transportation Needs: Unmet Transportation Needs   Lack of Transportation (Medical): No   Lack of Transportation (Non-Medical): Yes  Physical Activity: Not on file  Stress: Not on file  Social Connections: Not on file  Intimate Partner Violence: Not on file    No Known Allergies  Family  History  Problem Relation Age of Onset   Stroke Mother    Diabetes Mother    Hypertension Mother    Stroke Maternal Aunt    Heart attack Neg Hx     Prior to Admission medications   Medication Sig Start Date End Date Taking? Authorizing Provider  acetaminophen (TYLENOL) 500 MG tablet Take 1,000 mg by mouth every 8 (eight) hours as needed for mild pain, fever or headache.   Yes [provider]  aspirin EC 81 MG tablet Take 81 mg by mouth daily. Swallow whole.   Yes [provider]  atorvastatin (LIPITOR) 80 MG tablet Take 1 tablet (80 mg total) by mouth daily. 10/09/20  Yes  Milford, Maricela Bo, FNP  digoxin (LANOXIN) 0.125 MG tablet Take 1 tablet (0.125 mg total) by mouth daily. 10/09/20 12/14/20 Yes Milford, Maricela Bo, FNP  ezetimibe (ZETIA) 10 MG tablet Take 1 tablet (10 mg total) by mouth daily. 10/09/20 01/10/21 Yes Milford, Maricela Bo, FNP  insulin isophane & regular human KwikPen (NOVOLIN 70/30 KWIKPEN) (70-30) 100 UNIT/ML KwikPen 22 units SQ Q a.m and 18 units Q p.m Patient taking differently: Inject 18-22 Units into the skin See admin instructions. 22 units in the mornig and 18 units in the evening 10/27/20  Yes Ladell Pier, MD  ivabradine (CORLANOR) 7.5 MG TABS tablet Take 1 tablet (7.5 mg total) by mouth 2 (two) times daily with a meal. Patient taking differently: Take 11.25 mg by mouth 2 (two) times daily with a meal. 10/23/20  Yes Milford, Maricela Bo, FNP  levETIRAcetam (KEPPRA) 750 MG tablet Take 2 tablets (1,500 mg total) by mouth 2 (two) times daily. 10/14/20  Yes Ladell Pier, MD  Menthol, Topical Analgesic, (ICY HOT BACK EX) Apply 1 application topically as needed (neuropathy). Icy hot cream   Yes [provider]  metFORMIN (GLUCOPHAGE) 500 MG tablet Take 1 tablet (500 mg total) by mouth 2 (two) times daily with a meal. 10/18/20  Yes Ladell Pier, MD  metoprolol (TOPROL-XL) 200 MG 24 hr tablet Take 1 tablet (200 mg total) by mouth daily. Take with or immediately following a meal. 10/09/20 12/14/20 Yes Milford, Maricela Bo, FNP  Oxcarbazepine (TRILEPTAL) 300 MG tablet Take 2 tablets (600 mg total) by mouth 2 (two) times daily. 10/14/20  Yes Ladell Pier, MD  phenytoin (DILANTIN) 100 MG ER capsule Take 1 capsule (100 mg total) by mouth 3 (three) times daily. 10/14/20  Yes Ladell Pier, MD  pregabalin (LYRICA) 75 MG capsule Take 1 capsule (75 mg total) by mouth 2 (two) times daily. 11/29/20  Yes Ladell Pier, MD  sacubitril-valsartan (ENTRESTO) 49-51 MG Take 1 tablet by mouth 2 (two) times daily. 12/04/20  Yes Milford, Maricela Bo, FNP   spironolactone (ALDACTONE) 25 MG tablet Take 1 tablet (25 mg total) by mouth daily. 11/09/20  Yes Larey Dresser, MD  warfarin (COUMADIN) 5 MG tablet Take 1.5 tablets (7.5 mg total) by mouth daily. Patient taking differently: Take 5-7.5 mg by mouth See admin instructions. 7.48m on Tuesday Thursday Saturday 542mon Monday, Wednesday, Friday, Sunday 11/13/20  Yes McLarey DresserMD  Blood Glucose Monitoring Suppl (TRUE METRIX METER) w/Device KIT 1 each by Does not apply route 3 (three) times daily. 11/29/20   JoLadell PierMD  Blood Pressure Monitor DEVI Use as directed to check home blood pressure 2-3 times a week 10/12/18   JoLadell PierMD  enoxaparin (LOVENOX) 100 MG/ML injection Inject 1 syringe  into the skin daily for 5 days. Patient not taking: No sig reported 11/02/20 12/04/21  Larey Dresser, MD  furosemide (LASIX) 20 MG tablet Take 1 tablet (20 mg total) by mouth as needed (3 lb weight gain over night or 5 lb weight gain in one week). Patient not taking: No sig reported 12/04/20 01/03/21  Rafael Bihari, FNP  glucose blood (TRUE METRIX BLOOD GLUCOSE TEST) test strip Use as instructed 11/29/20   Ladell Pier, MD  Insulin Pen Needle (TRUEPLUS 5-BEVEL PEN NEEDLES) 32G X 4 MM MISC Use in the morning, at noon, and at bedtime. 11/21/20   Charlott Rakes, MD  Insulin Syringe-Needle U-100 (RELION INSULIN SYRINGE) 31G X 15/64" 0.3 ML MISC Use to inject insulin daily. 07/13/18   Ladell Pier, MD  TRUEplus Lancets 28G MISC Use as directed 11/29/20   Ladell Pier, MD    Physical Exam: Vitals:   12/14/20 0900 12/14/20 1000 12/14/20 1029 12/14/20 1100  BP: 102/74 108/77  119/89  Pulse: 100 98 99 94  Resp: '17 18  15  ' Temp:      TempSrc:      SpO2: 100% 98%  100%     General:  Appears calm and comfortable and is in NAD Eyes:  PERRL, EOMI, normal lids, iris ENT:  grossly normal hearing, lips & tongue, mmm; appropriate dentition Neck:  no LAD, masses or  thyromegaly Cardiovascular:  RRR, no m/r/g. No LE edema.  Respiratory:   CTA bilaterally with no wheezes/rales/rhonchi.  Normal respiratory effort. Abdomen:  soft, NT, ND Skin:  no rash or induration seen on limited exam Musculoskeletal:  grossly normal tone BUE/BLE, good ROM, no bony abnormality Psychiatric:  grossly normal mood and affect, speech fluent and appropriate, AOx3 Neurologic:  CN 2-12 grossly intact, moves all extremities in coordinated fashion    Radiological Exams on Admission: Independently reviewed - see discussion in A/P where applicable  DG Chest 2 View  Result Date: 12/14/2020 CLINICAL DATA:  Syncope.  History of ischemic cardiomyopathy EXAM: CHEST - 2 VIEW COMPARISON:  08/14/2020 FINDINGS: Normal heart size and mediastinal contours. No acute infiltrate or edema. No effusion or pneumothorax. No acute osseous findings. IMPRESSION: No active cardiopulmonary disease. Electronically Signed   By: Monte Fantasia M.D.   On: 12/14/2020 07:05   CT HEAD WO CONTRAST (5MM)  Result Date: 12/14/2020 CLINICAL DATA:  Facial trauma. EXAM: CT HEAD WITHOUT CONTRAST CT MAXILLOFACIAL WITHOUT CONTRAST TECHNIQUE: Multidetector CT imaging of the head and maxillofacial structures were performed using the standard protocol without intravenous contrast. Multiplanar CT image reconstructions of the maxillofacial structures were also generated. COMPARISON:  Brain MRI dated 09/21/2020.  Head CT dated 09/21/2020. FINDINGS: CT HEAD FINDINGS Brain: Old right frontal and parietal infarcts and encephalomalacia. The ventricles and sulci are otherwise appropriate size for patient's age. There is no acute intracranial hemorrhage. No mass effect or midline shift. No extra-axial fluid collection. Vascular: No hyperdense vessel or unexpected calcification. Skull: Normal. Negative for fracture or focal lesion. Other: None. CT MAXILLOFACIAL FINDINGS Osseous: No fracture or mandibular dislocation. No destructive process.  Orbits: The globes and retro-orbital fat are preserved. Sinuses: Clear. Soft tissues: Negative. IMPRESSION: 1. No acute intracranial pathology. 2. Old right frontal and parietal infarcts and encephalomalacia. 3. No acute facial bone fractures. Electronically Signed   By: Anner Crete M.D.   On: 12/14/2020 03:46   CT Maxillofacial Wo Contrast  Result Date: 12/14/2020 CLINICAL DATA:  Facial trauma. EXAM: CT HEAD WITHOUT CONTRAST CT  MAXILLOFACIAL WITHOUT CONTRAST TECHNIQUE: Multidetector CT imaging of the head and maxillofacial structures were performed using the standard protocol without intravenous contrast. Multiplanar CT image reconstructions of the maxillofacial structures were also generated. COMPARISON:  Brain MRI dated 09/21/2020.  Head CT dated 09/21/2020. FINDINGS: CT HEAD FINDINGS Brain: Old right frontal and parietal infarcts and encephalomalacia. The ventricles and sulci are otherwise appropriate size for patient's age. There is no acute intracranial hemorrhage. No mass effect or midline shift. No extra-axial fluid collection. Vascular: No hyperdense vessel or unexpected calcification. Skull: Normal. Negative for fracture or focal lesion. Other: None. CT MAXILLOFACIAL FINDINGS Osseous: No fracture or mandibular dislocation. No destructive process. Orbits: The globes and retro-orbital fat are preserved. Sinuses: Clear. Soft tissues: Negative. IMPRESSION: 1. No acute intracranial pathology. 2. Old right frontal and parietal infarcts and encephalomalacia. 3. No acute facial bone fractures. Electronically Signed   By: Anner Crete M.D.   On: 12/14/2020 03:46    EKG: Independently reviewed.  NSR with rate 99; nonspecific ST changes with no evidence of acute ischemia   Labs on Admission: I have personally reviewed the available labs and imaging studies at the time of the admission.  Pertinent labs:   Glucose 67, 70 BNP 54 HS troponin 19, 19 Unremarkable CBC INR 2.4 COVID/flu  negative   Assessment/Plan Principal Problem:   Syncope Active Problems:   HTN (hypertension)   HLD (hyperlipidemia)   Type 2 diabetes mellitus (HCC)   Chronic systolic CHF (congestive heart failure) (HCC)   LV (left ventricular) mural thrombus    Syncope -Patient with micturitional (near) syncopal episode at home -CT negative -h/o seizures but none reported -This patient is at moderate/high risk for serious outcome given his advanced heart failure and thus should be observed overnight on telemetry in the hospital. -Orthostatic vital signs now and in AM -Troponin negative x 2 -Neuro checks  -Cardiology consultation requested -EEG  Chronic systolic CHF -10/15/07 echo with EF 10-15% -Continue Digoxin, Ivabradine; hold Entresto, Aldactone per cardiology -May benefit from updated echo - will defer to cardiology -Dig level is 0.5 - ?compliance -Continue ASA -If ongoing pronounced systolic dysfunction, he may benefit from AICD/pacer placement due to recurrent syncope -Additionally, he may need to begin the process for evaluation for heart transplantation  H/o LV thrombus -Continuing Coumadin - pharmacy to dose;  -?duration of therapy -INR is therapeutic so he appears to be compliant   Seizure -Continue Keppra, Trileptal, Dilantin, Lyrica -Check dilantin level -Will repeat EEG -Seizure precautions   HTN -Continue Toprol XL -Hold Entresto, Aldactone per cardiology for now   HLD -Continue Lipitor 80 mg daily, Zetia   DM -Prior A1c shows very poor control, >15; will repeat -Hold home Glucophage -Hold 70/30 insulin due to hypoglycemia on presentation -Insulin dosing may need to be adjusted once the patient is fully compliant -Will order moderate-scale SSI -Diabetes coordinator consulted         Note: This patient has been tested and is negative for the novel coronavirus COVID-19. The patient has been fully vaccinated against COVID-19.   Level of care: Telemetry  Cardiac  DVT prophylaxis: Coumadin Code Status:  Full Family Communication: Wife was present throughout evaluation Disposition Plan:  The patient is from: home  Anticipated d/c is to: home  Anticipated d/c date will depend on clinical response to treatment, but may be as early as tomorrow with good response  Patient is currently: acutely ill Consults called: Cardiology; diabetes coordinator Admission status: It is my clinical opinion that  referral for OBSERVATION is reasonable and necessary in this patient based on the above information provided. The aforementioned taken together are felt to place the patient at high risk for further clinical deterioration. However it is anticipated that the patient may be medically stable for discharge from the hospital within 24 to 48 hours.       Karmen Bongo MD [facility]   How to contact the [facility] Attending or Consulting provider [place] or covering provider during after hours [place], for this patient?  Check the care team in [facility] and look for a) attending/consulting TR[facility] provider listed and b) the [facility] team listed Log into [email or web address] and use 's universal password to access. If you do not have the password, please contact the hospital operator. Locate the [facility] provider you are looking for under [facility] and page to a number that you can be directly reached. If you still have difficulty reaching the provider, please page the [facility] (Director on Call) for the Hospitalists listed on amion for assistance.   12/14/2020, 1:17 PM

## 2020-12-14 NOTE — Progress Notes (Signed)
EEG complete - results pending 

## 2020-12-14 NOTE — Consult Note (Addendum)
Advanced Heart Failure Team Consult Note   Primary Physician: Ladell Pier, MD PCP-Cardiologist:  Jenkins Rouge, MD Heart Failure: Dr Aundra Dubin   Reason for Consultation: Syncope   HPI:    Jacquel Mccamish is seen today for evaluation of syncope at the request of Dr Leonette Monarch.   Mr Brandenburg is a 35 year old with a history of  diabetes, HTN, and a long history of nonischemic cardiomyopathy, uncontrolled diabetes (Hgb A1C >15.5), LV thrombus and CVA, and chronic systolic heart failure.    . He had a cardiac MRI in 1/08 showing low EF, but he says that he had been told about "heart problems" even prior to that.  He does not have a family history of cardiomyopathy that he knows of, but does not know his father's family.  Coronary CT angiogram in 2014 showed no CAD. Echo in 2/17 showed EF 20-25%, similar to 2014.  He has generally had sporadic followup over the last few years. He was seen in the [facility] in 2017 . He then returned to Cardiology in 2020. Had repeat echo in 2020 showing LVEF 15-20%. RV normal.    He presented to [facility] ED 09/11/20 with tingling in left arm, left face, drooling and blank stare for about 1 week. In ED, he had left-sided neurologic deficit with dysarthria.  CT head with probable subacute infarct in right frontal lobe and old right parietal infarct.  MRI brain with probable hemorrhagic infarct involving the anterior right frontal lobe and left parietal occipital cortex with additional chronic right parietal infarct, right frontotemporal region enhancement suggesting changes related to acute seizure, possible toxic insult involving the caudate and lentiform nuclei bilaterally.  Neurology recommended interval follow-up brain MRI.  CTA head and neck without significant finding.TTE with EF of 10-15%, LV thrombus, moderate RV dysfunction and dilated IVC.He was discharged on GDMT, but A1c too high for SGLT2i.     Seen in [facility] ED 09/21/20 for fall after syncopal episode on toilet,  hitting his head. CT of the head was significant for no bleeding but new low-density left cerebellum suggesting acute infarct. EEG unremarkable and felt fall secondary to vagal episode not seizure.  He had atypical chest pain at his heart failure post hospital follow up. With his history of HLD and DM, decisions was made to undergo [facility] to evaluate coronary anatomy and hemodynamics. R/LHC (7/22) showed normal filling pressures, preserved cardiac output and no significant CAD. RA mean 1, RV 28/1, PA 19/7 mean 12, PCWP 3, CO/CI 5.54/2.92.  Last seen in the HF clinic on 12/04/20 and was doing ok. Entresto was increased to 49-51 mg twice a day. Lasix was changed to as needed.  He has been followed by HF Paramedicine and was last seen at his home 12/11/20. Compliant with medications and heart rate has been 100-110s.   Presented to [facility] ED after syncopal episode. Over the last 24 hours he has been feeling tired. Last night he did not take coumadin, dilantin, or lyrica. Says he didn't eat last night. Around 02:00 am he got up to go the bathroom and felt  lightheaded. While urinating he was dizzy and had syncopal episode and hit his head. He denies palpitations. Complaining of fatigue.Glucose on arrival in the ED was in the 60s. Given orange juice. CT of head negative for bleed and no fracture. CXR no acute findings.   HS Trop 19>21 , dig level 0.5, glucose 70, BNP 54, INR 2.4  Orthostatics:  Lying 105/77, Sitting 107/82,  Standing 96/67   Complaining of fatigue. Denies SOB   Cardiac Studies [facility] 10/2020 showed normal filling pressures, preserved cardiac output and no significant CAD. RA mean 1, RV 28/1, PA 19/7 mean 12, PCWP 3, CO/CI 5.54/2.92.  Echo 09/13/2020 - LV apical thrombus, LVEF 10-15%, RV moderately reduced.  Echo 10/2020- LV 15-20% RV normal  TEE-EF of 10-15%, LV thrombus, moderate RV dysfunction and dilated IVC. CMRI 04/17/2006 EF 45%   Review of Systems: [y] = yes, '[ ]'  = no   General: Weight  gain '[ ]' ; Weight loss '[ ]' ; Anorexia '[ ]' ; Fatigue [ Y]; Fever '[ ]' ; Chills '[ ]' ; Weakness [Y ]  Cardiac: Chest pain/pressure '[ ]' ; Resting SOB '[ ]' ; Exertional SOB '[ ]' ; Orthopnea '[ ]' ; Pedal Edema '[ ]' ; Palpitations '[ ]' ; Syncope [ Y]; Presyncope [ Y]; Paroxysmal nocturnal dyspnea'[ ]'   Pulmonary: Cough '[ ]' ; Wheezing'[ ]' ; Hemoptysis'[ ]' ; Sputum '[ ]' ; Snoring '[ ]'   GI: Vomiting'[ ]' ; Dysphagia'[ ]' ; Melena'[ ]' ; Hematochezia '[ ]' ; Heartburn'[ ]' ; Abdominal pain '[ ]' ; Constipation '[ ]' ; Diarrhea '[ ]' ; BRBPR '[ ]'   GU: Hematuria'[ ]' ; Dysuria '[ ]' ; Nocturia'[ ]'   Vascular: Pain in legs with walking '[ ]' ; Pain in feet with lying flat '[ ]' ; Non-healing sores '[ ]' ; Stroke [ Y]; TIA '[ ]' ; Slurred speech '[ ]' ;  Neuro: Headaches[ Y]; Vertigo'[ ]' ; Seizures[Y ]; Paresthesias'[ ]' ;Blurred vision '[ ]' ; Diplopia '[ ]' ; Vision changes '[ ]'   Ortho/Skin: Arthritis '[ ]' ; Joint pain [ Y]; Muscle pain '[ ]' ; Joint swelling '[ ]' ; Back Pain [ Y]; Rash '[ ]'   Psych: Depression'[ ]' ; Anxiety'[ ]'   Heme: Bleeding problems '[ ]' ; Clotting disorders '[ ]' ; Anemia '[ ]'   Endocrine: Diabetes [ Y]; Thyroid dysfunction'[ ]'   Home Medications Prior to Admission medications   Medication Sig Start Date End Date Taking? Authorizing Provider  aspirin EC 81 MG tablet Take 81 mg by mouth daily. Swallow whole.   Yes [provider]  atorvastatin (LIPITOR) 80 MG tablet Take 1 tablet (80 mg total) by mouth daily. 10/09/20  Yes Milford, Maricela Bo, FNP  digoxin (LANOXIN) 0.125 MG tablet Take 1 tablet (0.125 mg total) by mouth daily. 10/09/20 12/14/20 Yes Milford, Maricela Bo, FNP  ezetimibe (ZETIA) 10 MG tablet Take 1 tablet (10 mg total) by mouth daily. 10/09/20 01/10/21 Yes Milford, Maricela Bo, FNP  insulin isophane & regular human KwikPen (NOVOLIN 70/30 KWIKPEN) (70-30) 100 UNIT/ML KwikPen 22 units SQ Q a.m and 18 units Q p.m 10/27/20  Yes Ladell Pier, MD  metoprolol (TOPROL-XL) 200 MG 24 hr tablet Take 1 tablet (200 mg total) by mouth daily. Take with or immediately following a meal.  10/09/20 12/14/20 Yes Milford, Maricela Bo, FNP  Oxcarbazepine (TRILEPTAL) 300 MG tablet Take 2 tablets (600 mg total) by mouth 2 (two) times daily. 10/14/20  Yes Ladell Pier, MD  acetaminophen (TYLENOL) 500 MG tablet Take 1,000 mg by mouth at bedtime.    [provider]  Blood Glucose Monitoring Suppl (TRUE METRIX METER) w/Device KIT 1 each by Does not apply route 3 (three) times daily. 11/29/20   Ladell Pier, MD  Blood Pressure Monitor DEVI Use as directed to check home blood pressure 2-3 times a week 10/12/18   Ladell Pier, MD  enoxaparin (LOVENOX) 100 MG/ML injection Inject 1 syringe into the skin daily for 5 days. Patient not taking: No sig reported 11/02/20 12/04/21  Larey Dresser, MD  furosemide (LASIX) 20 MG tablet Take 1 tablet (  20 mg total) by mouth as needed (3 lb weight gain over night or 5 lb weight gain in one week). Patient not taking: Reported on 12/14/2020 12/04/20 01/03/21  Rafael Bihari, FNP  glucose blood (TRUE METRIX BLOOD GLUCOSE TEST) test strip Use as instructed 11/29/20   Ladell Pier, MD  Insulin Pen Needle (TRUEPLUS 5-BEVEL PEN NEEDLES) 32G X 4 MM MISC Use in the morning, at noon, and at bedtime. 11/21/20   Charlott Rakes, MD  Insulin Syringe-Needle U-100 (RELION INSULIN SYRINGE) 31G X 15/64" 0.3 ML MISC Use to inject insulin daily. 07/13/18   Ladell Pier, MD  ivabradine (CORLANOR) 7.5 MG TABS tablet Take 1 tablet (7.5 mg total) by mouth 2 (two) times daily with a meal. 10/23/20   Milford, Maricela Bo, FNP  levETIRAcetam (KEPPRA) 750 MG tablet Take 2 tablets (1,500 mg total) by mouth 2 (two) times daily. 10/14/20   Ladell Pier, MD  metFORMIN (GLUCOPHAGE) 500 MG tablet Take 1 tablet (500 mg total) by mouth 2 (two) times daily with a meal. 10/18/20   Ladell Pier, MD  phenytoin (DILANTIN) 100 MG ER capsule Take 1 capsule (100 mg total) by mouth 3 (three) times daily. 10/14/20   Ladell Pier, MD  pregabalin (LYRICA) 75 MG capsule Take  1 capsule (75 mg total) by mouth 2 (two) times daily. 11/29/20   Ladell Pier, MD  sacubitril-valsartan (ENTRESTO) 49-51 MG Take 1 tablet by mouth 2 (two) times daily. 12/04/20   Rafael Bihari, FNP  spironolactone (ALDACTONE) 25 MG tablet Take 1 tablet (25 mg total) by mouth daily. 11/09/20   Larey Dresser, MD  TRUEplus Lancets 28G MISC Use as directed 11/29/20   Ladell Pier, MD  warfarin (COUMADIN) 5 MG tablet Take 1.5 tablets (7.5 mg total) by mouth daily. 11/13/20   Larey Dresser, MD    Past Medical History: Past Medical History:  Diagnosis Date   Chronic systolic CHF (congestive heart failure) ([place]) 06/18/2015   Diabetes mellitus without complication ([place])    Hemorrhagic stroke ([place])    Hyperlipidemia    Hypertension    Nonischemic cardiomyopathy ([place]) Noted as early as 2007   Per chart review (cards consult note 2011), EF of 40% in 2007, down to 20-25% in 2011    Past Surgical History: Past Surgical History:  Procedure Laterality Date   None     RIGHT/LEFT HEART CATH AND CORONARY ANGIOGRAPHY N/A 11/02/2020   Procedure: RIGHT/LEFT HEART CATH AND CORONARY ANGIOGRAPHY;  Surgeon: Larey Dresser, MD;  Location: [place] [facility];  Service: Cardiovascular;  Laterality: N/A;    Family History: Family History  Problem Relation Age of Onset   Stroke Mother    Diabetes Mother    Hypertension Mother    Stroke Maternal Aunt    Heart attack Neg Hx     Social History: Social History   Socioeconomic History   Marital status: Married    Spouse name: Not on file   Number of children: Not on file   Years of education: Not on file   Highest education level: Not on file  Occupational History   Occupation: unemployed  Tobacco Use   Smoking status: Never   Smokeless tobacco: Never  Vaping Use   Vaping Use: Not on file  Substance and Sexual Activity   Alcohol use: Not Currently    Comment: "occasional" when "hanging out with the wrong people" No recent use.    Drug use: Not  Currently    Types: Marijuana    Comment: occasional, last 2013   Sexual activity: Not Currently  Other Topics Concern   Not on file  Social History Narrative   Pt lives alone. Has no information on father.   Social Determinants of Health   Financial Resource Strain: High Risk   Difficulty of Paying Living Expenses: Hard  Food Insecurity: No Food Insecurity   Worried About Charity fundraiser in the Last Year: Never true   Ran Out of Food in the Last Year: Never true  Transportation Needs: Unmet Transportation Needs   Lack of Transportation (Medical): No   Lack of Transportation (Non-Medical): Yes  Physical Activity: Not on file  Stress: Not on file  Social Connections: Not on file    Allergies:  No Known Allergies  Objective:    Vital Signs:   Temp:  [98.4 F (36.9 C)-98.9 F (37.2 C)] 98.9 F (37.2 C) (09/01 0433) Pulse Rate:  [95-104] 104 (09/01 0800) Resp:  [13-20] 13 (09/01 0800) BP: (92-106)/(61-81) 103/79 (09/01 0800) SpO2:  [99 %-100 %] 100 % (09/01 0800)    Weight change: There were no vitals filed for this visit.  Intake/Output:  No intake or output data in the 24 hours ending 12/14/20 0853    Physical Exam    General:  Thin appearing. No resp difficulty HEENT: normal Neck: supple. JVP flat  . Carotids 2+ bilat; no bruits. No lymphadenopathy or thyromegaly appreciated. Cor: PMI nondisplaced. Regular rate & rhythm. No rubs, gallops or murmurs. Lungs: clear Abdomen: soft, nontender, nondistended. No hepatosplenomegaly. No bruits or masses. Good bowel sounds. Extremities: no cyanosis, clubbing, rash, edema Neuro: alert & orientedx3, cranial nerves grossly intact. moves all 4 extremities w/o difficulty. Affect pleasant  Telemetry   SR-ST 90-100s with occasional PVCs.   EKG    SR 99 bpm with PVC   Labs   Basic Metabolic Panel: Recent Labs  Lab 12/13/20 1110 12/14/20 0314  NA 140 140  K 3.8 3.6  CL 103 102  CO2 29 29   GLUCOSE 128* 67*  BUN 13 12  CREATININE 0.85 0.84  CALCIUM 9.2 9.2    Liver Function Tests: No results for input(s): AST, ALT, ALKPHOS, BILITOT, PROT, ALBUMIN in the last 168 hours. No results for input(s): LIPASE, AMYLASE in the last 168 hours. No results for input(s): AMMONIA in the last 168 hours.  CBC: Recent Labs  Lab 12/14/20 0314  WBC 5.2  NEUTROABS 1.5*  HGB 12.9*  HCT 39.7  MCV 88.8  PLT 254    Cardiac Enzymes: No results for input(s): CKTOTAL, CKMB, CKMBINDEX, TROPONINI in the last 168 hours.  BNP: BNP (last 3 results) Recent Labs    10/30/20 1128 12/14/20 0314  BNP 112.2* 54.0    ProBNP (last 3 results) No results for input(s): PROBNP in the last 8760 hours.   CBG: Recent Labs  Lab 12/14/20 0533  GLUCAP 70    Coagulation Studies: Recent Labs    12/14/20 0700  LABPROT 26.4*  INR 2.4*     Imaging   DG Chest 2 View  Result Date: 12/14/2020 CLINICAL DATA:  Syncope.  History of ischemic cardiomyopathy EXAM: CHEST - 2 VIEW COMPARISON:  08/14/2020 FINDINGS: Normal heart size and mediastinal contours. No acute infiltrate or edema. No effusion or pneumothorax. No acute osseous findings. IMPRESSION: No active cardiopulmonary disease. Electronically Signed   By: Monte Fantasia M.D.   On: 12/14/2020 07:05   CT HEAD WO CONTRAST (5MM)  Result Date: 12/14/2020 CLINICAL DATA:  Facial trauma. EXAM: CT HEAD WITHOUT CONTRAST CT MAXILLOFACIAL WITHOUT CONTRAST TECHNIQUE: Multidetector CT imaging of the head and maxillofacial structures were performed using the standard protocol without intravenous contrast. Multiplanar CT image reconstructions of the maxillofacial structures were also generated. COMPARISON:  Brain MRI dated 09/21/2020.  Head CT dated 09/21/2020. FINDINGS: CT HEAD FINDINGS Brain: Old right frontal and parietal infarcts and encephalomalacia. The ventricles and sulci are otherwise appropriate size for patient's age. There is no acute intracranial  hemorrhage. No mass effect or midline shift. No extra-axial fluid collection. Vascular: No hyperdense vessel or unexpected calcification. Skull: Normal. Negative for fracture or focal lesion. Other: None. CT MAXILLOFACIAL FINDINGS Osseous: No fracture or mandibular dislocation. No destructive process. Orbits: The globes and retro-orbital fat are preserved. Sinuses: Clear. Soft tissues: Negative. IMPRESSION: 1. No acute intracranial pathology. 2. Old right frontal and parietal infarcts and encephalomalacia. 3. No acute facial bone fractures. Electronically Signed   By: Anner Crete M.D.   On: 12/14/2020 03:46   CT Maxillofacial Wo Contrast  Result Date: 12/14/2020 CLINICAL DATA:  Facial trauma. EXAM: CT HEAD WITHOUT CONTRAST CT MAXILLOFACIAL WITHOUT CONTRAST TECHNIQUE: Multidetector CT imaging of the head and maxillofacial structures were performed using the standard protocol without intravenous contrast. Multiplanar CT image reconstructions of the maxillofacial structures were also generated. COMPARISON:  Brain MRI dated 09/21/2020.  Head CT dated 09/21/2020. FINDINGS: CT HEAD FINDINGS Brain: Old right frontal and parietal infarcts and encephalomalacia. The ventricles and sulci are otherwise appropriate size for patient's age. There is no acute intracranial hemorrhage. No mass effect or midline shift. No extra-axial fluid collection. Vascular: No hyperdense vessel or unexpected calcification. Skull: Normal. Negative for fracture or focal lesion. Other: None. CT MAXILLOFACIAL FINDINGS Osseous: No fracture or mandibular dislocation. No destructive process. Orbits: The globes and retro-orbital fat are preserved. Sinuses: Clear. Soft tissues: Negative. IMPRESSION: 1. No acute intracranial pathology. 2. Old right frontal and parietal infarcts and encephalomalacia. 3. No acute facial bone fractures. Electronically Signed   By: Anner Crete M.D.   On: 12/14/2020 03:46     Medications:     Current  Medications:   Infusions:     Patient Profile  Mr Eulis Canner is a 35 year old with a history of  diabetes, HTN, and a long history of nonischemic cardiomyopathy, uncontrolled diabetes (Hgb A1C >15.5), LV thrombus and CVA, and chronic systolic heart failure.   Presented with syncope.  Assessment/Plan   Syncope -Had syncope in June and felt to be vagal episode.  - Syncopal episode today when urinating.  CT of head no acute findings.  -  I checked orthostatics. Sitting 107/82 Standing 95/67. He recently had entresto increased and reports poor po intake. Will give 250 cc NS bolus now.  - Has known reduced EF for ask EP to evaluate for ICD.    2. Chronic HFrHF -NICM.  EF has been down for year. Most recent ECHO 09/13/2020 EF 10%.  - Had LHC/RHC  10/2020 and this showed preserved cardiac output and no significant coronary disease.  - On exam he appears dry and orthostatic. Give 250 cc NS bolus.  - Hold bb - Restart home ivabradine 7.5 mg twice a day and digoxin 0.125 mg daily. Dig level 0.5. - Hold entresto/spironolactone  - No SGLT2i with Hgb A1C > 15.5  3. Uncontrolled DM Most recent 08/2020 Hg A1C > 15.5. Check Hgb AiC Glucose on arrival in the 60s. Improved to 70s after orange juice.  -  Will need SSI  4. H/O 09/2020 Seizures On keppra, dilantin  5. CVA  -Suspect ischemic CVA on 09/11/20 with hemorrhagic conversion, from LV thrombus.   - On coumadin and statin.   6. LV Thrombus  On coumadin. INR 2.4    Length of Stay: 0  Darrick Grinder, NP  12/14/2020, 8:53 AM  Advanced Heart Failure Team Pager [phone] (M-F; 7a - 5p)  Please contact [facility] for night-coverage after hours (4p -7a ) and weekends on [email or web address]   Patient seen and examined with the above-signed Advanced Practice Provider and/or Housestaff. I personally reviewed laboratory data, imaging studies and relevant notes. I independently examined the patient and formulated the important aspects of the plan. I have  edited the note to reflect any of my changes or salient points. I have personally discussed the plan with the patient and/or family.  35 y/o with severe NICM EF 10%. At baseline NYHA III symptoms. Presents to ER with episode of abrupt syncope while standing up to urinate. Says he felt weak and tried to get his wife up to help him go to the bathroom. Near the end of urinating began to feel weaker and had LOC.   In ER was orthostatic. Rhythm stable. K 3.6. hs trop 19  General:  Weak appearing. No resp difficulty HEENT: normal Neck: supple. no JVD. Carotids 2+ bilat; no bruits. No lymphadenopathy or thryomegaly appreciated. Cor: PMI nondisplaced. Regular rate & rhythm. No rubs, gallops or murmurs. Lungs: clear Abdomen: soft, nontender, nondistended. No hepatosplenomegaly. No bruits or masses. Good bowel sounds. Extremities: no cyanosis, clubbing, rash, edema Neuro: alert & orientedx3, cranial nerves grossly intact. moves all 4 extremities w/o difficulty. Affect pleasant  Suspect syncope likely orthostatic in nature/micturition syncope and not arrhythmic. Will hydrate. Watch overnight on tele. EP seeing.   Glori Bickers, MD  3:54 PM

## 2020-12-14 NOTE — ED Notes (Signed)
Dr. Ophelia Charter messaged me back about pt's cardiac episode at 1645. No new orders.

## 2020-12-14 NOTE — ED Notes (Signed)
Attempted to call report

## 2020-12-14 NOTE — ED Notes (Signed)
I introduced myself to pt. Pt AxO x4. VSS. GCS 15. Wife at bedside. Pt denies pain. I told him they're cleaning a room. Pt given a snack. Denies further needs.

## 2020-12-14 NOTE — ED Provider Notes (Signed)
Emergency Medicine Provider Triage Evaluation Note  Jeremaine Maraj , a 35 y.o. male  was evaluated in triage.  Pt complains of syncope.  States that he stood up to urinate and passed out.  States that he fell and hit his head and right eye in the bathroom.  Denies any other complaints at this time.  Review of Systems  Positive: Syncope, face pain Negative: Fever, chills  Physical Exam  BP 92/68 (BP Location: Left Arm)   Pulse 98   Temp 98.4 F (36.9 C) (Oral)   Resp 18   SpO2 100%  Gen:   Awake, no distress   Resp:  Normal effort  MSK:   Moves extremities without difficulty  Other:    Medical Decision Making  Medically screening exam initiated at 3:17 AM.  Appropriate orders placed.  Eitan Doubleday was informed that the remainder of the evaluation will be completed by another provider, this initial triage assessment does not replace that evaluation, and the importance of remaining in the ED until their evaluation is complete.  syncope   Roxy Horseman, Cordelia Poche 12/14/20 Lucinda Dell, April, MD 12/14/20 [phone]

## 2020-12-15 DIAGNOSIS — I5022 Chronic systolic (congestive) heart failure: Secondary | ICD-10-CM | POA: Diagnosis not present

## 2020-12-15 DIAGNOSIS — I255 Ischemic cardiomyopathy: Secondary | ICD-10-CM | POA: Diagnosis not present

## 2020-12-15 DIAGNOSIS — R55 Syncope and collapse: Secondary | ICD-10-CM | POA: Diagnosis not present

## 2020-12-15 LAB — GLUCOSE, CAPILLARY
Glucose-Capillary: 123 mg/dL — ABNORMAL HIGH (ref 70–99)
Glucose-Capillary: 130 mg/dL — ABNORMAL HIGH (ref 70–99)
Glucose-Capillary: 143 mg/dL — ABNORMAL HIGH (ref 70–99)
Glucose-Capillary: 153 mg/dL — ABNORMAL HIGH (ref 70–99)
Glucose-Capillary: 234 mg/dL — ABNORMAL HIGH (ref 70–99)
Glucose-Capillary: 76 mg/dL (ref 70–99)

## 2020-12-15 LAB — BASIC METABOLIC PANEL
Anion gap: 8 (ref 5–15)
BUN: 10 mg/dL (ref 6–20)
CO2: 28 mmol/L (ref 22–32)
Calcium: 9.3 mg/dL (ref 8.9–10.3)
Chloride: 99 mmol/L (ref 98–111)
Creatinine, Ser: 0.89 mg/dL (ref 0.61–1.24)
GFR, Estimated: >60 mL/min (ref >60)
Glucose, Bld: 142 mg/dL — ABNORMAL HIGH (ref 70–99)
Potassium: 4 mmol/L (ref 3.5–5.1)
Sodium: 135 mmol/L (ref 135–145)

## 2020-12-15 LAB — CBC
HCT: 40.6 % (ref 39.0–52.0)
Hemoglobin: 14 g/dL (ref 13.0–17.0)
MCH: 29.4 pg (ref 26.0–34.0)
MCHC: 34.5 g/dL (ref 30.0–36.0)
MCV: 85.3 fL (ref 80.0–100.0)
Platelets: 246 10*3/uL (ref 150–400)
RBC: 4.76 MIL/uL (ref 4.22–5.81)
RDW: 11.6 % (ref 11.5–15.5)
WBC: 4.3 10*3/uL (ref 4.0–10.5)
nRBC: 0 % (ref 0.0–0.2)

## 2020-12-15 LAB — PROTIME-INR
INR: 1.9 — ABNORMAL HIGH (ref 0.8–1.2)
Prothrombin Time: 21.9 seconds — ABNORMAL HIGH (ref 11.4–15.2)

## 2020-12-15 MED ORDER — SACUBITRIL-VALSARTAN 24-26 MG PO TABS
1.0000 | ORAL_TABLET | Freq: Two times a day (BID) | ORAL | Status: DC
  Administered 2020-12-15 – 2020-12-16 (×3): 1 via ORAL
  Filled 2020-12-15 (×4): qty 1

## 2020-12-15 MED ORDER — WARFARIN SODIUM 7.5 MG PO TABS
7.5000 mg | ORAL_TABLET | Freq: Once | ORAL | Status: AC
  Administered 2020-12-15: 7.5 mg via ORAL
  Filled 2020-12-15: qty 1

## 2020-12-15 MED ORDER — SPIRONOLACTONE 12.5 MG HALF TABLET
12.5000 mg | ORAL_TABLET | Freq: Every day | ORAL | Status: DC
  Administered 2020-12-15 – 2020-12-16 (×2): 12.5 mg via ORAL
  Filled 2020-12-15 (×4): qty 1

## 2020-12-15 MED ORDER — INSULIN ASPART PROT & ASPART (70-30 MIX) 100 UNIT/ML ~~LOC~~ SUSP
10.0000 [IU] | Freq: Two times a day (BID) | SUBCUTANEOUS | Status: DC
  Administered 2020-12-15 – 2020-12-16 (×3): 10 [IU] via SUBCUTANEOUS
  Filled 2020-12-15: qty 10

## 2020-12-15 NOTE — Progress Notes (Signed)
Progress Note  Patient Name: Jason Stevenson Date of Encounter: 12/15/2020  [facility] Cardiologist: Charlton Haws, MD   Subjective   Thinks he is doing OK, no specific complaints  Inpatient Medications    Scheduled Meds:  aspirin EC  81 mg Oral Daily   atorvastatin  80 mg Oral Daily   digoxin  0.125 mg Oral Daily   ezetimibe  10 mg Oral Daily   insulin aspart  0-15 Units Subcutaneous TID WC   insulin aspart  0-5 Units Subcutaneous QHS   ivabradine  7.5 mg Oral BID WC   levETIRAcetam  1,500 mg Oral BID   metoprolol  200 mg Oral Daily   Oxcarbazepine  600 mg Oral BID   phenytoin  100 mg Oral TID   pregabalin  75 mg Oral BID   sodium chloride flush  3 mL Intravenous Q12H   Warfarin - Pharmacist Dosing Inpatient   Does not apply q1600   Continuous Infusions:  PRN Meds: acetaminophen **OR** acetaminophen, ondansetron **OR** ondansetron (ZOFRAN) IV   Vital Signs    Vitals:   12/14/20 1801 12/14/20 2003 12/15/20 0012 12/15/20 0433  BP: (!) 135/97 (!) 128/94 (!) 127/99 (!) 133/99  Pulse: (!) 107 (!) 106 (!) 104 100  Resp: 16 17 17 18   Temp: 98.2 F (36.8 C) 98.6 F (37 C) 98.6 F (37 C) 98.3 F (36.8 C)  TempSrc: Oral Oral Oral Oral  SpO2: 96% 100% 99% 99%  Weight:    66.8 kg    Intake/Output Summary (Last 24 hours) at 12/15/2020 0835 Last data filed at 12/15/2020 0016 Gross per 24 hour  Intake 250 ml  Output 2500 ml  Net -2250 ml   Last 3 Weights 12/15/2020 12/11/2020 12/04/2020  Weight (lbs) 147 lb 4.3 oz 153 lb 156 lb  Weight (kg) 66.8 kg 69.4 kg 70.761 kg      Telemetry    SR/ST 90's-100s - Personally Reviewed  ECG    No new EKGs - Personally Reviewed  Physical Exam   GEN: No acute distress.   Neck: No JVD Cardiac: RRR, no murmurs, rubs, or gallops.  Respiratory: CTA b/l. GI: Soft, nontender, non-distended  MS: No edema; No deformity. Neuro:  Nonfocal  Psych: Normal affect   Labs    High Sensitivity Troponin:   Recent Labs  Lab  12/13/20 1110 12/14/20 0546 12/14/20 0729  TROPONINIHS 18* 19* 19*      Chemistry Recent Labs  Lab 12/13/20 1110 12/14/20 0314 12/15/20 0538  NA 140 140 135  K 3.8 3.6 4.0  CL 103 102 99  CO2 29 29 28   GLUCOSE 128* 67* 142*  BUN 13 12 10   CREATININE 0.85 0.84 0.89  CALCIUM 9.2 9.2 9.3  GFRNONAA >60 >60 >60  ANIONGAP 8 9 8      Hematology Recent Labs  Lab 12/14/20 0314 12/15/20 0538  WBC 5.2 4.3  RBC 4.47 4.76  HGB 12.9* 14.0  HCT 39.7 40.6  MCV 88.8 85.3  MCH 28.9 29.4  MCHC 32.5 34.5  RDW 11.9 11.6  PLT 254 246    BNP Recent Labs  Lab 12/14/20 0314  BNP 54.0     DDimer No results for input(s): DDIMER in the last 168 hours.   Radiology    DG Chest 2 View  Result Date: 12/14/2020 CLINICAL DATA:  Syncope.  History of ischemic cardiomyopathy EXAM: CHEST - 2 VIEW COMPARISON:  08/14/2020 FINDINGS: Normal heart size and mediastinal contours. No acute infiltrate or edema. No  effusion or pneumothorax. No acute osseous findings. IMPRESSION: No active cardiopulmonary disease. Electronically Signed   By: Marnee Spring M.D.   On: 12/14/2020 07:05   CT HEAD WO CONTRAST ( )  Result Date: 12/14/2020 CLINICAL DATA:  Facial trauma. EXAM: CT HEAD WITHOUT CONTRAST CT MAXILLOFACIAL WITHOUT CONTRAST TECHNIQUE: Multidetector CT imaging of the head and maxillofacial structures were performed using the standard protocol without intravenous contrast. Multiplanar CT image reconstructions of the maxillofacial structures were also generated. COMPARISON:  Brain MRI dated 09/21/2020.  Head CT dated 09/21/2020. FINDINGS: CT HEAD FINDINGS Brain: Old right frontal and parietal infarcts and encephalomalacia. The ventricles and sulci are otherwise appropriate size for patient's age. There is no acute intracranial hemorrhage. No mass effect or midline shift. No extra-axial fluid collection. Vascular: No hyperdense vessel or unexpected calcification. Skull: Normal. Negative for fracture or  focal lesion. Other: None. CT MAXILLOFACIAL FINDINGS Osseous: No fracture or mandibular dislocation. No destructive process. Orbits: The globes and retro-orbital fat are preserved. Sinuses: Clear. Soft tissues: Negative. IMPRESSION: 1. No acute intracranial pathology. 2. Old right frontal and parietal infarcts and encephalomalacia. 3. No acute facial bone fractures. Electronically Signed   By: Elgie Collard M.D.   On: 12/14/2020 03:46   EEG adult  Result Date: 12/14/2020 Charlsie Quest, MD     12/14/2020  6:30 PM Patient Name: Jason Stevenson MRN: [id number] Epilepsy Attending: Charlsie Quest Referring Physician/Provider: Dr [name] Date: 12/14/2020 Duration: 23.29 mins Patient history: 35yo m with h/o presented with syncope. EEG to evaluate for seizure. Level of alertness: Awake AEDs during EEG study: LEV, OXC, PHT, PGB Technical aspects: This EEG study was done with scalp electrodes positioned according to the 10-20 International system of electrode placement. Electrical activity was acquired at a sampling rate of 500Hz  and reviewed with a high frequency filter of 70Hz  and a low frequency filter of 1Hz . EEG data were recorded continuously and digitally stored. Description: The posterior dominant rhythm consists of 9-10 Hz activity of moderate voltage (25-35 uV) seen predominantly in posterior head regions, symmetric and reactive to eye opening and eye closing. Physiologic photic driving was seen during photic stimulation.  Hyperventilation was not performed.   IMPRESSION: This study is within normal limits. No seizures or epileptiform discharges were seen throughout the recording.   CT Maxillofacial Wo Contrast  Result Date: 12/14/2020 CLINICAL DATA:  Facial trauma. EXAM: CT HEAD WITHOUT CONTRAST CT MAXILLOFACIAL WITHOUT CONTRAST TECHNIQUE: Multidetector CT imaging of the head and maxillofacial structures were performed using the standard protocol without intravenous contrast.  Multiplanar CT image reconstructions of the maxillofacial structures were also generated. COMPARISON:  Brain MRI dated 09/21/2020.  Head CT dated 09/21/2020. FINDINGS: CT HEAD FINDINGS Brain: Old right frontal and parietal infarcts and encephalomalacia. The ventricles and sulci are otherwise appropriate size for patient's age. There is no acute intracranial hemorrhage. No mass effect or midline shift. No extra-axial fluid collection. Vascular: No hyperdense vessel or unexpected calcification. Skull: Normal. Negative for fracture or focal lesion. Other: None. CT MAXILLOFACIAL FINDINGS Osseous: No fracture or mandibular dislocation. No destructive process. Orbits: The globes and retro-orbital fat are preserved. Sinuses: Clear. Soft tissues: Negative. IMPRESSION: 1. No acute intracranial pathology. 2. Old right frontal and parietal infarcts and encephalomalacia. 3. No acute facial bone fractures. Electronically Signed   By: 02/13/2021 M.D.   On: 12/14/2020 03:46    Cardiac Studies   [facility] 11/02/20  no CAD, preserved CO RA mean 1 RV 28/1 PA 19/7,  mean 12 PCWP mean 3 AO 86/56 Oxygen saturations: PA 99% AO 73% Cardiac Output (Fick) 5.54  Cardiac Index (Fick) 2.92     09/13/2020: TTE IMPRESSIONS   1. 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   2. Left ventricular ejection fraction, by estimation, is 10-15%. The left  ventricle has severely decreased function. The left ventricle demonstrates  global hypokinesis. The left ventricular internal cavity size was mildly  dilated. Left ventricular  diastolic parameters are indeterminate.   3. Right ventricular systolic function is moderately reduced. The right  ventricular size is normal. Tricuspid regurgitation signal is inadequate  for assessing PA pressure.   4. Left atrial size was mildly dilated.   5. The mitral valve is grossly normal. Mild mitral valve regurgitation.  No evidence of mitral stenosis.   6. The aortic valve is  tricuspid. Aortic valve regurgitation is not  visualized. No aortic stenosis is present.   7. The inferior vena cava is dilated in size with <50% respiratory  variability, suggesting right atrial pressure of 15 mmHg.   Patient Profile     35 y.o. 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 Patient does not think he has adult/"regular" medicaid yet  EP follow up for revisiting ICD implant timing is in place A1c looks MUCH better Will discuss with Dr. Lalla Brothers, not sure if we need to think about life vest?   Continue with AHF team, I see their social workers/team are very involved as well.    For questions or updates, please contact [facility] Please consult [email or web address] for contact info under        Signed, Sheilah Pigeon, PA-C  12/15/2020, 8:35 AM

## 2020-12-15 NOTE — Progress Notes (Signed)
[place] for warfarin Indication:  LV thrombus  No Known Allergies  Patient Measurements: Weight: 66.8 kg (147 lb 4.3 oz)  Vital Signs: Temp: 98.3 F (36.8 C) (09/02 0433) Temp Source: Oral (09/02 0433) BP: 133/99 (09/02 0433) Pulse Rate: 100 (09/02 0433)  Labs: Recent Labs    12/13/20 1110 12/14/20 0314 12/14/20 0546 12/14/20 0700 12/14/20 0729 12/15/20 0538  HGB  --  12.9*  --   --   --  14.0  HCT  --  39.7  --   --   --  40.6  PLT  --  254  --   --   --  246  LABPROT  --   --   --  26.4*  --  21.9*  INR  --   --   --  2.4*  --  1.9*  CREATININE 0.85 0.84  --   --   --  0.89  TROPONINIHS 18*  --  19*  --  19*  --      Estimated Creatinine Clearance: 107.4 mL/min (by C-G formula based on SCr of 0.89 mg/dL).   Medical History: Past Medical History:  Diagnosis Date   Chronic systolic CHF (congestive heart failure) ([place]) 06/18/2015   Diabetes mellitus without complication ([place])    Hemorrhagic stroke ([place])    Hyperlipidemia    Hypertension    Nonischemic cardiomyopathy ([place]) Noted as early as 2007   Per chart review (cards consult note 2011), EF of 40% in 2007, down to 20-25% in 2011    Medications:  Medications Prior to Admission  Medication Sig Dispense Refill Last Dose   acetaminophen (TYLENOL) 500 MG tablet Take 1,000 mg by mouth every 8 (eight) hours as needed for mild pain, fever or headache.   Past Week   aspirin EC 81 MG tablet Take 81 mg by mouth daily. Swallow whole.   12/13/2020   atorvastatin (LIPITOR) 80 MG tablet Take 1 tablet (80 mg total) by mouth daily. 30 tablet 2 12/13/2020   digoxin (LANOXIN) 0.125 MG tablet Take 1 tablet (0.125 mg total) by mouth daily. 30 tablet 2 12/13/2020   ezetimibe (ZETIA) 10 MG tablet Take 1 tablet (10 mg total) by mouth daily. 30 tablet 2 12/13/2020   insulin isophane & regular human KwikPen (NOVOLIN 70/30 KWIKPEN) (70-30) 100 UNIT/ML KwikPen 22 units SQ Q a.m and 18 units Q p.m  (Patient taking differently: Inject 18-22 Units into the skin See admin instructions. 22 units in the mornig and 18 units in the evening) 13.2 mL 5 12/13/2020   ivabradine (CORLANOR) 7.5 MG TABS tablet Take 1 tablet (7.5 mg total) by mouth 2 (two) times daily with a meal. (Patient taking differently: Take 11.25 mg by mouth 2 (two) times daily with a meal.) 60 tablet  12/13/2020   levETIRAcetam (KEPPRA) 750 MG tablet Take 2 tablets (1,500 mg total) by mouth 2 (two) times daily. 120 tablet 2 12/13/2020   Menthol, Topical Analgesic, (ICY HOT BACK EX) Apply 1 application topically as needed (neuropathy). Icy hot cream   Past Week   metFORMIN (GLUCOPHAGE) 500 MG tablet Take 1 tablet (500 mg total) by mouth 2 (two) times daily with a meal. 60 tablet 4 12/13/2020   metoprolol (TOPROL-XL) 200 MG 24 hr tablet Take 1 tablet (200 mg total) by mouth daily. Take with or immediately following a meal. 30 tablet 2 12/13/2020 at 0900   Oxcarbazepine (TRILEPTAL) 300 MG tablet Take 2 tablets (600 mg total) by  mouth 2 (two) times daily. 120 tablet 2 12/13/2020   phenytoin (DILANTIN) 100 MG ER capsule Take 1 capsule (100 mg total) by mouth 3 (three) times daily. 90 capsule 2 12/13/2020   pregabalin (LYRICA) 75 MG capsule Take 1 capsule (75 mg total) by mouth 2 (two) times daily. 60 capsule 1 12/13/2020   sacubitril-valsartan (ENTRESTO) 49-51 MG Take 1 tablet by mouth 2 (two) times daily. 60 tablet 6 12/13/2020   spironolactone (ALDACTONE) 25 MG tablet Take 1 tablet (25 mg total) by mouth daily. 30 tablet 5 12/13/2020   warfarin (COUMADIN) 5 MG tablet Take 1.5 tablets (7.5 mg total) by mouth daily. (Patient taking differently: Take 5-7.5 mg by mouth See admin instructions. 7.83m on Tuesday Thursday Saturday 575mon Monday, Wednesday, Friday, Sunday) 45 tablet 1 12/12/2020 at 2300   Blood Glucose Monitoring Suppl (TRUE METRIX METER) w/Device KIT 1 each by Does not apply route 3 (three) times daily. 1 kit 0    Blood Pressure Monitor DEVI  Use as directed to check home blood pressure 2-3 times a week 1 Device 0    glucose blood (TRUE METRIX BLOOD GLUCOSE TEST) test strip Use as instructed 100 each 12    Insulin Pen Needle (TRUEPLUS 5-BEVEL PEN NEEDLES) 32G X 4 MM MISC Use in the morning, at noon, and at bedtime. 100 each 2    Insulin Syringe-Needle U-100 (RELION INSULIN SYRINGE) 31G X 15/64" 0.3 ML MISC Use to inject insulin daily. 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 On warfarin at home. Pharmacy consulted to resume warfarin.   INR subtherapeutic at 1.9 today likely 2/2 missed dose prior to admit.   Home warfarin regimen: 7.5 mg on Tue/Thu/Sat, 5 mg on all other days   Goal of Therapy:  INR 2-3 Monitor platelets by anticoagulation protocol: Yes   Plan:  -Warfarin 7.5 mg x 1 dose today - slight boost -Monitor daily PT/INR and s/s of bleeding    MiArrie SenatePharmD, BCPS, BCTidelands Georgetown Memorial Hospitallinical Pharmacist [phone] check AMION for all MCDunloumbers 12/15/2020

## 2020-12-15 NOTE — Progress Notes (Addendum)
Advanced Heart Failure Rounding Note  PCP-Cardiologist: Charlton Haws, MD  [facility]: Dr. Shirlee Latch   Subjective:    Feeling better today. No further syncope. Did not get dizzy w/ standing this am. I have asked RN to recheck orthostatic VS today. SBPs 102-135.   Denies dyspnea. No palpitations. Tele shows sinus tach, low 100s. No arrhythmia.   Appetite remains poor.  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 He is on Lyrica.   Labs WNL. INR 1.9    Objective:   Weight Range: 66.8 kg Body mass index is 20.54 kg/m.   Vital Signs:   Temp:  [97.2 F (36.2 C)-98.6 F (37 C)] 98.3 F (36.8 C) (09/02 0433) Pulse Rate:  [94-107] 100 (09/02 0433) Resp:  [14-21] 18 (09/02 0433) BP: (106-135)/(71-99) 133/99 (09/02 0433) SpO2:  [96 %-100 %] 99 % (09/02 0433) Weight:  [66.8 kg] 66.8 kg (09/02 0433)    Weight change: Filed Weights   12/15/20 0433  Weight: 66.8 kg    Intake/Output:   Intake/Output Summary (Last 24 hours) at 12/15/2020 0955 Last data filed at 12/15/2020 0016 Gross per 24 hour  Intake 250 ml  Output 2500 ml  Net -2250 ml      Physical Exam    General:  Well appearing, thin young AAM. No resp difficulty HEENT: Normal Neck: Supple. JVP . Carotids 2+ bilat; no bruits. No lymphadenopathy or thyromegaly appreciated. Cor: PMI nondisplaced. Regular rhythm, mildly tachy. No rubs, gallops or murmurs. Lungs: Clear Abdomen: Soft, nontender, nondistended. No hepatosplenomegaly. No bruits or masses. Good bowel sounds. Extremities: No cyanosis, clubbing, rash, edema Neuro: Alert & orientedx3, cranial nerves grossly intact. moves all 4 extremities w/o difficulty. Affect pleasant   Telemetry   Sinus tach low 100s, no arrhythmias   EKG    No new EKG to review   Labs    CBC Recent Labs    12/14/20 0314 12/15/20 0538  WBC 5.2 4.3  NEUTROABS 1.5*  --   HGB 12.9* 14.0  HCT 39.7 40.6  MCV 88.8 85.3  PLT 254 246   Basic Metabolic Panel Recent  Labs    12/14/20 0314 12/15/20 0538  NA 140 135  K 3.6 4.0  CL 102 99  CO2 29 28  GLUCOSE 67* 142*  BUN 12 10  CREATININE 0.84 0.89  CALCIUM 9.2 9.3   Liver Function Tests No results for input(s): AST, ALT, ALKPHOS, BILITOT, PROT, ALBUMIN in the last 72 hours. No results for input(s): LIPASE, AMYLASE in the last 72 hours. Cardiac Enzymes No results for input(s): CKTOTAL, CKMB, CKMBINDEX, TROPONINI in the last 72 hours.  BNP: BNP (last 3 results) Recent Labs    10/30/20 1128 12/14/20 0314  BNP 112.2* 54.0    ProBNP (last 3 results) No results for input(s): PROBNP in the last 8760 hours.   D-Dimer No results for input(s): DDIMER in the last 72 hours. Hemoglobin A1C Recent Labs    12/14/20 1250  HGBA1C 8.3*   Fasting Lipid Panel No results for input(s): CHOL, HDL, LDLCALC, TRIG, CHOLHDL, LDLDIRECT in the last 72 hours. Thyroid Function Tests Recent Labs    12/14/20 1245  TSH 1.741    Other results:   Imaging    EEG adult  Result Date: 12/14/2020 Charlsie Quest, MD     12/14/2020  6:30 PM Patient Name: Jason Stevenson MRN: [id number] Epilepsy Attending: Charlsie Quest Referring Physician/Provider: Dr [name] Date: 12/14/2020 Duration: 23.29 mins Patient history: 35yo m with  h/o presented with syncope. EEG to evaluate for seizure. Level of alertness: Awake AEDs during EEG study: LEV, OXC, PHT, PGB Technical aspects: This EEG study was done with scalp electrodes positioned according to the 10-20 International system of electrode placement. Electrical activity was acquired at a sampling rate of 500Hz  and reviewed with a high frequency filter of 70Hz  and a low frequency filter of 1Hz . EEG data were recorded continuously and digitally stored. Description: The posterior dominant rhythm consists of 9-10 Hz activity of moderate voltage (25-35 uV) seen predominantly in posterior head regions, symmetric and reactive to eye opening and eye closing. Physiologic photic  driving was seen during photic stimulation.  Hyperventilation was not performed.   IMPRESSION: This study is within normal limits. No seizures or epileptiform discharges were seen throughout the recording. Priyanka     Medications:     Scheduled Medications:  aspirin EC  81 mg Oral Daily   atorvastatin  80 mg Oral Daily   digoxin  0.125 mg Oral Daily   ezetimibe  10 mg Oral Daily   insulin aspart  0-15 Units Subcutaneous TID WC   insulin aspart  0-5 Units Subcutaneous QHS   ivabradine  7.5 mg Oral BID WC   levETIRAcetam  1,500 mg Oral BID   metoprolol  200 mg Oral Daily   Oxcarbazepine  600 mg Oral BID   phenytoin  100 mg Oral TID   pregabalin  75 mg Oral BID   sodium chloride flush  3 mL Intravenous Q12H   Warfarin - Pharmacist Dosing Inpatient   Does not apply q1600    Infusions:   PRN Medications: acetaminophen **OR** acetaminophen, ondansetron **OR** ondansetron (ZOFRAN) IV    Patient Profile   35 y/o with severe NICM EF 10%. At baseline NYHA III symptoms.  Also w/ h/o CVA, seizures and uncontrolled DM. Presented to ER with episode of abrupt syncope while standing up to urinate. Says he felt weak and tried to get his wife up to help him go to the bathroom. Near the end of urinating began to feel weaker and had LOC.    In ER was orthostatic. Rhythm stable. K 3.6. hs trop 19  Assessment/Plan   Syncope -Had syncope in June and felt to be vagal episode.  - Syncopal episode 9/1 likely orthostatic in nature/micturition syncope and not arrhythmic. No arrhthymias noted on tele. EP has seen and also agrees likely not arrhythmic etiology - CT of head no acute findings.  - He recently had entresto increased and reports poor po intake. IVF bolus given - BP better today. I have asked RN to recheck orthostatics - continue to hold BP active meds for now. Will likely need lower dose Entresto once restarted  - Given chronically low EF, he is still being consider for ICD for  primary prevention. EP to decide on timing of implant (awaiting insurance).    2. Chronic HFrHF -NICM.  EF has been down for a year. Most recent ECHO 09/13/2020 EF 10%.  - Had LHC/RHC  10/2020 and this showed preserved cardiac output and no significant coronary disease.  - On this admit, felt to be dry and orthostatic. Given 250 cc NS bolus.  - Entresto/spironolactone on hold - Will likely need lower dose Entresto once restarted  - c/w Toprol XL 200 mg daily  - Not on SGLT2i with recent Hgb A1C > 15.5. This has improved, now at 8.3 but will continue to hold off on initiation for now given recent  volume depletion  - C/w ivabradine 7.5 mg twice a day and digoxin 0.125 mg daily. Dig level ok 0.5.  3. Uncontrolled DM -  08/2020 Hg A1C > 15.5.  - Improving, Hgb A1c is now 8.3 - Glucose on arrival in the 60s. Improved to 70s after orange juice.  - management per PCP    4. H/O 09/2020 Seizures - On keppra, dilantin   5. CVA  -Suspect ischemic CVA on 09/11/20 with hemorrhagic conversion, from LV thrombus.   - On coumadin and statin.    6. LV Thrombus  - On coumadin. INR 1.9  - dosing per pharmacy   Length of Stay: 0  Robbie Lis, PA-C  12/15/2020, 9:55 AM  Advanced Heart Failure Team Pager [phone] (M-F; 7a - 5p)  Please contact [facility] for night-coverage after hours (5p -7a ) and weekends on [email or web address]  Patient seen with PA, agree with the above note.  Entresto and spironolactone on hold.  Today, BP is stable (SBP 130s generally).  Not lightheaded now.  No complaints.   General: NAD Neck: No JVD, no thyromegaly or thyroid nodule.  Lungs: Clear to auscultation bilaterally with normal respiratory effort. CV: Nondisplaced PMI.  Heart regular S1/S2, no S3/S4, no murmur.  No peripheral edema.   Abdomen: Soft, nontender, no hepatosplenomegaly, no distention.  Skin: Intact without lesions or rashes.  Neurologic: Alert and oriented x 3.  Psych: Normal affect. Extremities: No  clubbing or cyanosis.  HEENT: Normal.   Micturation syncope in setting of low BP from HF meds.  Do not think this was arrhythmic.  - Restart Entresto at lower dose, 24/25 bid.  - Restart spironolactone at lower dose, 12.5 mg daily.   Long-term, he will need ICD but needs to get on Medicaid.  Needs close office followup for CPX, may need advanced therapies soon with long-standing, severe nonischemic CMP.  Suspect not arrhythmic syncope, will defer Lifevest decision to EP.   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   He has followup in CHF clinic. If he tolerates lower doses of meds today, think he can go home tomorrow.   Marca Ancona 12/15/2020 1:10 PM

## 2020-12-15 NOTE — Progress Notes (Signed)
PROGRESS NOTE    Jason Stevenson  ENM:[id number] DOB: April 26, 1985 DOA: 12/14/2020 PCP: Marcine Matar, MD  Brief Narrative:Jason Stevenson is a 35 y.o. 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  He woke yesterday morning to void.  As he was finishing, he felt light-headed and queasy,   He "fell out" and found himself in the floor upon awakening   Assessment & Plan:     Syncope -Suspected to be orthostatic versus micturition -Clinically do not suspect arrhythmias, none detected on telemetry either -Presumed to be related to increased Entresto dosing recently -Cardiology following, EP consulted as well due to need for ICD for severe cardiomyopathy  Severe chronic systolic CHF -EF of 10-15%, left heart cath in July noted no significant coronary disease and preserved cardiac output -This is suspected to be hypovolemic on admission, given 250 mL fluid bolus, Entresto and Aldactone held -Appears euvolemic, continued on ivabradine and digoxin -Heart failure team following, resume ?beta-blocker and likely lower dose Entresto at discharge  Type 2 diabetes mellitus with hyperglycemia -Improving, hemoglobin A1c is 8.3 -On insulin 70/30 at baseline, 22 and 18 units, restart at a lower dose  History of seizures -Continued on home regimen of Keppra and Dilantin  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  Dispo: The patient is from: Home              Anticipated d/c is  to: Home              Patient currently is not medically stable to d/c.   Difficult to place patient No        Consultants:  Cardiology, EP  Procedures:   Antimicrobials:    Subjective: -Feels okay, denies any complaints  Objective: Vitals:   12/15/20 0433 12/15/20 1038 12/15/20 1042 12/15/20 1045  BP: (!) 133/99 121/83 (!) 121/91 109/79  Pulse: 100 (!) 108 (!) 110 (!) 56  Resp: 18     Temp: 98.3 F (36.8 C) 98.3 F (36.8 C)    TempSrc: Oral Oral    SpO2: 99%     Weight: 66.8 kg       Intake/Output Summary (Last 24 hours) at 12/15/2020 1101 Last data filed at 12/15/2020 0016 Gross per 24 hour  Intake 250 ml  Output 2500 ml  Net -2250 ml   Filed Weights   12/15/20 0433  Weight: 66.8 kg    Examination:  General exam: Appears calm and comfortable  Respiratory system: Clear to auscultation Cardiovascular system: S1 & S2 heard, RRR.  Abd: nondistended, soft and nontender.Normal bowel sounds heard. Central nervous system: Alert and oriented. No focal neurological deficits. Extremities: No edema Skin: No rashes Psychiatry: Judgement and insight appear normal. Mood & affect appropriate.     Data Reviewed:   CBC: Recent Labs  Lab 12/14/20 0314 12/15/20 0538  WBC 5.2 4.3  NEUTROABS 1.5*  --   HGB 12.9* 14.0  HCT 39.7 40.6  MCV 88.8 85.3  PLT 254 246   Basic Metabolic Panel: Recent Labs  Lab  12/13/20 1110 12/14/20 0314 12/15/20 0538  NA 140 140 135  K 3.8 3.6 4.0  CL 103 102 99  CO2 29 29 28   GLUCOSE 128* 67* 142*  BUN 13 12 10   CREATININE 0.85 0.84 0.89  CALCIUM 9.2 9.2 9.3   GFR: Estimated Creatinine Clearance: 107.4 mL/min (by C-G formula based on SCr of 0.89 mg/dL). Liver Function Tests: No results for input(s): AST, ALT, ALKPHOS, BILITOT, PROT, ALBUMIN in the last 168 hours. No results for input(s): LIPASE, AMYLASE in the last 168 hours. No results for input(s): AMMONIA in the last 168 hours. Coagulation Profile: Recent Labs  Lab  12/14/20 0700 12/15/20 0538  INR 2.4* 1.9*   Cardiac Enzymes: No results for input(s): CKTOTAL, CKMB, CKMBINDEX, TROPONINI in the last 168 hours. BNP (last 3 results) No results for input(s): PROBNP in the last 8760 hours. HbA1C: Recent Labs    12/14/20 1250  HGBA1C 8.3*   CBG: Recent Labs  Lab 12/14/20 1804 12/14/20 2010 12/15/20 0009 12/15/20 0431 12/15/20 0758  GLUCAP 109* 185* 153* 130* 234*   Lipid Profile: No results for input(s): CHOL, HDL, LDLCALC, TRIG, CHOLHDL, LDLDIRECT in the last 72 hours. Thyroid Function Tests: Recent Labs    12/14/20 1245  TSH 1.741   Anemia Panel: No results for input(s): VITAMINB12, FOLATE, FERRITIN, TIBC, IRON, RETICCTPCT in the last 72 hours. Urine analysis:    Component Value Date/Time   COLORURINE YELLOW 09/21/2020 0551   APPEARANCEUR CLEAR 09/21/2020 0551   LABSPEC 1.030 09/21/2020 0551   PHURINE 6.0 09/21/2020 0551   GLUCOSEU NEGATIVE 09/21/2020 0551   HGBUR NEGATIVE 09/21/2020 0551   BILIRUBINUR NEGATIVE 09/21/2020 0551   BILIRUBINUR neg 04/17/2016 1134   KETONESUR NEGATIVE 09/21/2020 0551   PROTEINUR NEGATIVE 09/21/2020 0551   UROBILINOGEN 1.0 04/17/2016 1134   UROBILINOGEN 1.0 11/05/2012 1608   NITRITE NEGATIVE 09/21/2020 0551   LEUKOCYTESUR TRACE (A) 09/21/2020 0551   Sepsis Labs: @LABRCNTIP (procalcitonin:4,lacticidven:4)  ) Recent Results (from the past 240 hour(s))  Resp Panel by RT-PCR (Flu A&B, Covid) Nasopharyngeal Swab     Status: None   Collection Time: 12/14/20  9:55 AM   Specimen: Nasopharyngeal Swab; Nasopharyngeal(NP) swabs in vial transport medium  Result Value Ref Range Status   SARS Coronavirus 2 by RT PCR NEGATIVE NEGATIVE Final    Comment: (NOTE) SARS-CoV-2 target nucleic acids are NOT DETECTED.  The SARS-CoV-2 RNA is generally detectable in upper respiratory specimens during the acute phase of infection. The lowest concentration of SARS-CoV-2 viral copies this assay can detect is 138  copies/mL. A negative result does not preclude SARS-Cov-2 infection and should not be used as the sole basis for treatment or other patient management decisions. A negative result may occur with  improper specimen collection/handling, submission of specimen other than nasopharyngeal swab, presence of viral mutation(s) within the areas targeted by this assay, and inadequate number of viral copies(<138 copies/mL). A negative result must be combined with clinical observations, patient history, and epidemiological information. The expected result is Negative.  Fact Sheet for Patients:  BloggerCourse.com  Fact Sheet for Healthcare Providers:  SeriousBroker.it  This test is no t yet approved or cleared by the [place] FDA and  has been authorized for detection and/or diagnosis of SARS-CoV-2 by FDA under an Emergency Use Authorization (EUA). This EUA will remain  in effect (meaning this test can be used) for the duration of the COVID-19 declaration under Section 564(b)(1) of the Act, 21 U.S.C.section 360bbb-3(b)(1), unless the authorization is terminated  or  revoked sooner.       Influenza A by PCR NEGATIVE NEGATIVE Final   Influenza B by PCR NEGATIVE NEGATIVE Final    Comment: (NOTE) The Xpert Xpress SARS-CoV-2/FLU/RSV plus assay is intended as an aid in the diagnosis of influenza from Nasopharyngeal swab specimens and should not be used as a sole basis for treatment. Nasal washings and aspirates are unacceptable for Xpert Xpress SARS-CoV-2/FLU/RSV testing.  Fact Sheet for Patients: BloggerCourse.com  Fact Sheet for Healthcare Providers: SeriousBroker.it  This test is not yet approved or cleared by the [place] FDA and has been authorized for detection and/or diagnosis of SARS-CoV-2 by FDA under an Emergency Use Authorization (EUA). This EUA will remain in effect (meaning  this test can be used) for the duration of the COVID-19 declaration under Section 564(b)(1) of the Act, 21 U.S.C. section 360bbb-3(b)(1), unless the authorization is terminated or revoked.  Performed at [facility], [address] [place], [place] [address]          Radiology Studies: DG Chest 2 View  Result Date: 12/14/2020 CLINICAL DATA:  Syncope.  History of ischemic cardiomyopathy EXAM: CHEST - 2 VIEW COMPARISON:  08/14/2020 FINDINGS: Normal heart size and mediastinal contours. No acute infiltrate or edema. No effusion or pneumothorax. No acute osseous findings. IMPRESSION: No active cardiopulmonary disease. Electronically Signed   By: Marnee Spring M.D.   On: 12/14/2020 07:05   CT HEAD WO CONTRAST ( )  Result Date: 12/14/2020 CLINICAL DATA:  Facial trauma. EXAM: CT HEAD WITHOUT CONTRAST CT MAXILLOFACIAL WITHOUT CONTRAST TECHNIQUE: Multidetector CT imaging of the head and maxillofacial structures were performed using the standard protocol without intravenous contrast. Multiplanar CT image reconstructions of the maxillofacial structures were also generated. COMPARISON:  Brain MRI dated 09/21/2020.  Head CT dated 09/21/2020. FINDINGS: CT HEAD FINDINGS Brain: Old right frontal and parietal infarcts and encephalomalacia. The ventricles and sulci are otherwise appropriate size for patient's age. There is no acute intracranial hemorrhage. No mass effect or midline shift. No extra-axial fluid collection. Vascular: No hyperdense vessel or unexpected calcification. Skull: Normal. Negative for fracture or focal lesion. Other: None. CT MAXILLOFACIAL FINDINGS Osseous: No fracture or mandibular dislocation. No destructive process. Orbits: The globes and retro-orbital fat are preserved. Sinuses: Clear. Soft tissues: Negative. IMPRESSION: 1. No acute intracranial pathology. 2. Old right frontal and parietal infarcts and encephalomalacia. 3. No acute facial bone fractures. Electronically Signed    By: Elgie Collard M.D.   On: 12/14/2020 03:46   EEG adult  Result Date: 12/14/2020 Charlsie Quest, MD     12/14/2020  6:30 PM Patient Name: Amaury Kuzel MRN: [id number] Epilepsy Attending: Charlsie Quest Referring Physician/Provider: Dr [name] Date: 12/14/2020 Duration: 23.29 mins Patient history: 35yo m with h/o presented with syncope. EEG to evaluate for seizure. Level of alertness: Awake AEDs during EEG study: LEV, OXC, PHT, PGB Technical aspects: This EEG study was done with scalp electrodes positioned according to the 10-20 International system of electrode placement. Electrical activity was acquired at a sampling rate of 500Hz  and reviewed with a high frequency filter of 70Hz  and a low frequency filter of 1Hz . EEG data were recorded continuously and digitally stored. Description: The posterior dominant rhythm consists of 9-10 Hz activity of moderate voltage (25-35 uV) seen predominantly in posterior head regions, symmetric and reactive to eye opening and eye closing. Physiologic photic driving was seen during photic stimulation.  Hyperventilation was not performed.   IMPRESSION: This study is within normal limits. No  seizures or epileptiform discharges were seen throughout the recording. Charlsie Quest   CT Maxillofacial Wo Contrast  Result Date: 12/14/2020 CLINICAL DATA:  Facial trauma. EXAM: CT HEAD WITHOUT CONTRAST CT MAXILLOFACIAL WITHOUT CONTRAST TECHNIQUE: Multidetector CT imaging of the head and maxillofacial structures were performed using the standard protocol without intravenous contrast. Multiplanar CT image reconstructions of the maxillofacial structures were also generated. COMPARISON:  Brain MRI dated 09/21/2020.  Head CT dated 09/21/2020. FINDINGS: CT HEAD FINDINGS Brain: Old right frontal and parietal infarcts and encephalomalacia. The ventricles and sulci are otherwise appropriate size for patient's age. There is no acute intracranial hemorrhage. No mass effect or  midline shift. No extra-axial fluid collection. Vascular: No hyperdense vessel or unexpected calcification. Skull: Normal. Negative for fracture or focal lesion. Other: None. CT MAXILLOFACIAL FINDINGS Osseous: No fracture or mandibular dislocation. No destructive process. Orbits: The globes and retro-orbital fat are preserved. Sinuses: Clear. Soft tissues: Negative. IMPRESSION: 1. No acute intracranial pathology. 2. Old right frontal and parietal infarcts and encephalomalacia. 3. No acute facial bone fractures. Electronically Signed   By: Elgie Collard M.D.   On: 12/14/2020 03:46        Scheduled Meds:  aspirin EC  81 mg Oral Daily   atorvastatin  80 mg Oral Daily   digoxin  0.125 mg Oral Daily   ezetimibe  10 mg Oral Daily   insulin aspart  0-15 Units Subcutaneous TID WC   insulin aspart  0-5 Units Subcutaneous QHS   ivabradine  7.5 mg Oral BID WC   levETIRAcetam  1,500 mg Oral BID   metoprolol  200 mg Oral Daily   Oxcarbazepine  600 mg Oral BID   phenytoin  100 mg Oral TID   pregabalin  75 mg Oral BID   sodium chloride flush  3 mL Intravenous Q12H   warfarin  7.5 mg Oral ONCE-1600   Warfarin - Pharmacist Dosing Inpatient   Does not apply q1600   Continuous Infusions:   LOS: 0 days    Time spent:   Zannie Cove, MD [facility]   12/15/2020, 11:01 AM

## 2020-12-15 NOTE — Care Management (Signed)
Consult for : Can you clarify his insurance status?  He did not think he currently had coverage, but seems he may have medicaid?  Thanks you.  Entered Benefit check

## 2020-12-16 DIAGNOSIS — I5022 Chronic systolic (congestive) heart failure: Secondary | ICD-10-CM | POA: Diagnosis not present

## 2020-12-16 DIAGNOSIS — I255 Ischemic cardiomyopathy: Secondary | ICD-10-CM | POA: Diagnosis not present

## 2020-12-16 LAB — BASIC METABOLIC PANEL
Anion gap: 7 (ref 5–15)
BUN: 13 mg/dL (ref 6–20)
CO2: 28 mmol/L (ref 22–32)
Calcium: 9.2 mg/dL (ref 8.9–10.3)
Chloride: 103 mmol/L (ref 98–111)
Creatinine, Ser: 0.79 mg/dL (ref 0.61–1.24)
GFR, Estimated: >60 mL/min (ref >60)
Glucose, Bld: 92 mg/dL (ref 70–99)
Potassium: 4.1 mmol/L (ref 3.5–5.1)
Sodium: 138 mmol/L (ref 135–145)

## 2020-12-16 LAB — PROTIME-INR
INR: 2.1 — ABNORMAL HIGH (ref 0.8–1.2)
Prothrombin Time: 23.6 seconds — ABNORMAL HIGH (ref 11.4–15.2)

## 2020-12-16 LAB — GLUCOSE, CAPILLARY
Glucose-Capillary: 162 mg/dL — ABNORMAL HIGH (ref 70–99)
Glucose-Capillary: 90 mg/dL (ref 70–99)
Glucose-Capillary: 97 mg/dL (ref 70–99)

## 2020-12-16 MED ORDER — SPIRONOLACTONE 25 MG PO TABS: 12.5000 mg | ORAL_TABLET | Freq: Every day | ORAL | Status: DC

## 2020-12-16 MED ORDER — SACUBITRIL-VALSARTAN 24-26 MG PO TABS: 1.0000 | ORAL_TABLET | Freq: Two times a day (BID) | ORAL | 0 refills | Status: DC

## 2020-12-16 NOTE — Progress Notes (Signed)
Patient has ordered for discharge. Given discharge instructions with paper to the patient and family. Iv removed. Given all belongings to the patient and family. 

## 2020-12-16 NOTE — Discharge Summary (Addendum)
Physician Discharge Summary  Jason Stevenson [id number] DOB: Aug 17, 1985 DOA: 12/14/2020  PCP: Ladell Pier, MD  Admit date: 12/14/2020 Discharge date: 12/16/2020  Time spent: 53mnutes  Recommendations for Outpatient Follow-up:  Heart failure clinic 9/16 EP Dr. LQuentin Orein 2 to 3 weeks, evaluation for AICD   Discharge Diagnoses:  Principal Problem:   Syncope Chronic systolic CHF, EF 189-21%History of CVA History of seizures   HTN (hypertension)   HLD (hyperlipidemia)   Type 2 diabetes mellitus (HCC)   Chronic systolic CHF (congestive heart failure) ([place]   LV (left ventricular) mural thrombus   Ischemic cardiomyopathy   Discharge Condition: Stable  Diet recommendation: Diabetic, low-sodium, heart healthy  Filed Weights   12/15/20 0433 12/16/20 0418  Weight: 66.8 kg 65.8 kg    History of present illness:  GChristopher Glasscockis a 35y.o. male with medical history significant of chronic systolic CHF (EF 119-41%with LV thrombus); DM; HTN; hemorrhagic stroke; seizure activity confirmed on EEG, on Keppra, Dilantin, and Trileptal; and HLD presenting with syncope.  He woke yesterday morning to void.  As he was finishing, he felt light-headed and queasy,   He "fell out" and found himself in the floor upon awakening  Hospital Course:    Syncope -Suspected to be orthostatic versus micturition syncope -Clinically do not suspect arrhythmias, none detected on telemetry either -Presumed to be related to increased Entresto dosing recently -Cardiology following, EP consulted as well due to need for ICD for severe cardiomyopathy -Improved and stable, orthostatics are better, discharged home on lower dose of Entresto and Aldactone -Follow-up with heart failure team   Severe chronic systolic CHF -EF of 174-08% left heart cath in July noted no significant coronary disease and preserved cardiac output -This is suspected to be hypovolemic on admission, given 250 mL fluid bolus,  Entresto and Aldactone held -Appears euvolemic, continued on ivabradine and digoxin -Heart failure team following, resumed Entresto at a lower dose at discharge and Aldactone dose decreased to 12.5 mg  -Follow-up with heart failure team  type 2 diabetes mellitus with hyperglycemia -Improving, hemoglobin A1c is 8.3 -On insulin 70/30 at baseline, 22 and 18 units, required lower dose in the hospital   History of seizures -Continued on home regimen of Keppra and Dilantin   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 from LV thrombus -Continue Coumadin and statin   History of LV thrombus -On Coumadin, INR 2.0 at discharge         Consultants:  Cardiology, EP  Discharge Exam: Vitals:   12/16/20 0748 12/16/20 0751  BP:    Pulse: 92 62  Resp:    Temp:    SpO2:      General: Awake alert oriented x3, no distress Cardiovascular: S1-S2, regular rate rhythm Respiratory: Clear  Discharge Instructions   Discharge Instructions     Diet - low sodium heart healthy   Complete by: As directed    Diet Carb Modified   Complete by: As directed    Increase activity slowly   Complete by: As directed       Allergies as of 12/16/2020   No Known Allergies      Medication List     STOP taking these medications    Entresto 49-51 MG Generic drug: sacubitril-valsartan Replaced by: sacubitril-valsartan 24-26 MG       TAKE these medications    acetaminophen 500 MG tablet Commonly known as: TYLENOL Take 1,000 mg by mouth every 8 (eight) hours as needed for  mild pain, fever or headache.   aspirin EC 81 MG tablet Take 81 mg by mouth daily. Swallow whole.   atorvastatin 80 MG tablet Commonly known as: LIPITOR Take 1 tablet (80 mg total) by mouth daily.   Blood Pressure Monitor Devi Use as directed to check home blood pressure 2-3 times a week   digoxin 0.125 MG tablet Commonly known as: LANOXIN Take 1 tablet (0.125 mg total) by mouth daily.   ezetimibe 10 MG  tablet Commonly known as: ZETIA Take 1 tablet (10 mg total) by mouth daily.   ICY HOT BACK EX Apply 1 application topically as needed (neuropathy). Icy hot cream   Insulin Syringe-Needle U-100 31G X 15/64" 0.3 ML Misc Commonly known as: ReliOn Insulin Syringe Use to inject insulin daily.   ivabradine 7.5 MG Tabs tablet Commonly known as: Corlanor Take 1 tablet (7.5 mg total) by mouth 2 (two) times daily with a meal. What changed: how much to take   levETIRAcetam 750 MG tablet Commonly known as: KEPPRA Take 2 tablets (1,500 mg total) by mouth 2 (two) times daily.   metFORMIN 500 MG tablet Commonly known as: GLUCOPHAGE Take 1 tablet (500 mg total) by mouth 2 (two) times daily with a meal.   metoprolol 200 MG 24 hr tablet Commonly known as: TOPROL-XL Take 1 tablet (200 mg total) by mouth daily. Take with or immediately following a meal.   NovoLIN 70/30 Kwikpen (70-30) 100 UNIT/ML KwikPen Generic drug: insulin isophane & regular human KwikPen 22 units SQ Q a.m and 18 units Q p.m What changed:  how much to take how to take this when to take this additional instructions   Oxcarbazepine 300 MG tablet Commonly known as: TRILEPTAL Take 2 tablets (600 mg total) by mouth 2 (two) times daily.   phenytoin 100 MG ER capsule Commonly known as: DILANTIN Take 1 capsule (100 mg total) by mouth 3 (three) times daily.   pregabalin 75 MG capsule Commonly known as: Lyrica Take 1 capsule (75 mg total) by mouth 2 (two) times daily.   sacubitril-valsartan 24-26 MG Commonly known as: ENTRESTO Take 1 tablet by mouth 2 (two) times daily. Replaces: Entresto 49-51 MG   spironolactone 25 MG tablet Commonly known as: ALDACTONE Take 0.5 tablets (12.5 mg total) by mouth daily. What changed: how much to take   True Metrix Blood Glucose Test test strip Generic drug: glucose blood Use as instructed   True Metrix Meter w/Device Kit 1 each by Does not apply route 3 (three) times daily.    TRUEplus 5-Bevel Pen Needles 32G X 4 MM Misc Generic drug: Insulin Pen Needle Use in the morning, at noon, and at bedtime.   TRUEplus Lancets 28G Misc Use as directed   warfarin 5 MG tablet Commonly known as: COUMADIN Take as directed. If you are unsure how to take this medication, talk to your nurse or doctor. Original instructions: Take 1.5 tablets (7.5 mg total) by mouth daily. What changed:  how much to take when to take this additional instructions       No Known Allergies  Follow-up Information     Vickie Epley, MD Follow up.   Specialties: Cardiology, Radiology Why: 01/29/21 @ 10:30AM, revisit defibrillator implant timing Contact information: [address] [place] [address] [place] Follow up.   Specialty: Cardiology Why: 12/29/20 at 11:00 AM [facility]  [facility], [place] Code [phone] Contact information: [address] [id number] [place] [phone]                 The results of significant diagnostics from this hospitalization (including imaging, microbiology, ancillary and laboratory) are listed below for reference.    Significant Diagnostic Studies: DG Chest 2 View  Result Date: 12/14/2020 CLINICAL DATA:  Syncope.  History of ischemic cardiomyopathy EXAM: CHEST - 2 VIEW COMPARISON:  08/14/2020 FINDINGS: Normal heart size and mediastinal contours. No acute infiltrate or edema. No effusion or pneumothorax. No acute osseous findings. IMPRESSION: No active cardiopulmonary disease. Electronically Signed   By: Monte Fantasia M.D.   On: 12/14/2020 07:05   CT HEAD WO CONTRAST (5MM)  Result Date: 12/14/2020 CLINICAL DATA:  Facial trauma. EXAM: CT HEAD WITHOUT CONTRAST CT MAXILLOFACIAL WITHOUT CONTRAST TECHNIQUE: Multidetector CT imaging of the head and maxillofacial structures were  performed using the standard protocol without intravenous contrast. Multiplanar CT image reconstructions of the maxillofacial structures were also generated. COMPARISON:  Brain MRI dated 09/21/2020.  Head CT dated 09/21/2020. FINDINGS: CT HEAD FINDINGS Brain: Old right frontal and parietal infarcts and encephalomalacia. The ventricles and sulci are otherwise appropriate size for patient's age. There is no acute intracranial hemorrhage. No mass effect or midline shift. No extra-axial fluid collection. Vascular: No hyperdense vessel or unexpected calcification. Skull: Normal. Negative for fracture or focal lesion. Other: None. CT MAXILLOFACIAL FINDINGS Osseous: No fracture or mandibular dislocation. No destructive process. Orbits: The globes and retro-orbital fat are preserved. Sinuses: Clear. Soft tissues: Negative. IMPRESSION: 1. No acute intracranial pathology. 2. Old right frontal and parietal infarcts and encephalomalacia. 3. No acute facial bone fractures. Electronically Signed   By: Anner Crete M.D.   On: 12/14/2020 03:46   EEG adult  Result Date: 12/14/2020 Lora Havens, MD     12/14/2020  6:30 PM Patient Name: Jason Stevenson MRN: [id number] Epilepsy Attending: Lora Havens Referring Physician/Provider: Dr Karmen Bongo Date: 12/14/2020 Duration: 23.29 mins Patient history: 35yo m with h/o presented with syncope. EEG to evaluate for seizure. Level of alertness: Awake AEDs during EEG study: LEV, OXC, PHT, PGB Technical aspects: This EEG study was done with scalp electrodes positioned according to the 10-20 International system of electrode placement. Electrical activity was acquired at a sampling rate of '500Hz'  and reviewed with a high frequency filter of '70Hz'  and a low frequency filter of '1Hz' . EEG data were recorded continuously and digitally stored. Description: The posterior dominant rhythm consists of 9-10 Hz activity of moderate voltage (25-35 uV) seen predominantly in posterior head  regions, symmetric and reactive to eye opening and eye closing. Physiologic photic driving was seen during photic stimulation.  Hyperventilation was not performed.   IMPRESSION: This study is within normal limits. No seizures or epileptiform discharges were seen throughout the recording. Lora Havens   CT Maxillofacial Wo Contrast  Result Date: 12/14/2020 CLINICAL DATA:  Facial trauma. EXAM: CT HEAD WITHOUT CONTRAST CT MAXILLOFACIAL WITHOUT CONTRAST TECHNIQUE: Multidetector CT imaging of the head and maxillofacial structures were performed using the standard protocol without intravenous contrast. Multiplanar CT image reconstructions of the maxillofacial structures were also generated. COMPARISON:  Brain MRI dated 09/21/2020.  Head CT dated 09/21/2020. FINDINGS: CT HEAD FINDINGS Brain: Old right frontal and parietal infarcts and encephalomalacia. The ventricles and sulci are otherwise appropriate size for patient's age. There is no acute intracranial hemorrhage. No mass effect or midline shift. No extra-axial fluid  collection. Vascular: No hyperdense vessel or unexpected calcification. Skull: Normal. Negative for fracture or focal lesion. Other: None. CT MAXILLOFACIAL FINDINGS Osseous: No fracture or mandibular dislocation. No destructive process. Orbits: The globes and retro-orbital fat are preserved. Sinuses: Clear. Soft tissues: Negative. IMPRESSION: 1. No acute intracranial pathology. 2. Old right frontal and parietal infarcts and encephalomalacia. 3. No acute facial bone fractures. Electronically Signed   By: Anner Crete M.D.   On: 12/14/2020 03:46    Microbiology: Recent Results (from the past 240 hour(s))  Resp Panel by RT-PCR (Flu A&B, Covid) Nasopharyngeal Swab     Status: None   Collection Time: 12/14/20  9:55 AM   Specimen: Nasopharyngeal Swab; Nasopharyngeal(NP) swabs in vial transport medium  Result Value Ref Range Status   SARS Coronavirus 2 by RT PCR NEGATIVE NEGATIVE Final     Comment: (NOTE) SARS-CoV-2 target nucleic acids are NOT DETECTED.  The SARS-CoV-2 RNA is generally detectable in upper respiratory specimens during the acute phase of infection. The lowest concentration of SARS-CoV-2 viral copies this assay can detect is 138 copies/mL. A negative result does not preclude SARS-Cov-2 infection and should not be used as the sole basis for treatment or other patient management decisions. A negative result may occur with  improper specimen collection/handling, submission of specimen other than nasopharyngeal swab, presence of viral mutation(s) within the areas targeted by this assay, and inadequate number of viral copies(<138 copies/mL). A negative result must be combined with clinical observations, patient history, and epidemiological information. The expected result is Negative.  Fact Sheet for Patients:  EntrepreneurPulse.com.au  Fact Sheet for Healthcare Providers:  IncredibleEmployment.be  This test is no t yet approved or cleared by the [place] FDA and  has been authorized for detection and/or diagnosis of SARS-CoV-2 by FDA under an Emergency Use Authorization (EUA). This EUA will remain  in effect (meaning this test can be used) for the duration of the COVID-19 declaration under Section 564(b)(1) of the Act, 21 U.S.C.section 360bbb-3(b)(1), unless the authorization is terminated  or revoked sooner.       Influenza A by PCR NEGATIVE NEGATIVE Final   Influenza B by PCR NEGATIVE NEGATIVE Final    Comment: (NOTE) The Xpert Xpress SARS-CoV-2/FLU/RSV plus assay is intended as an aid in the diagnosis of influenza from Nasopharyngeal swab specimens and should not be used as a sole basis for treatment. Nasal washings and aspirates are unacceptable for Xpert Xpress SARS-CoV-2/FLU/RSV testing.  Fact Sheet for Patients: EntrepreneurPulse.com.au  Fact Sheet for Healthcare  Providers: IncredibleEmployment.be  This test is not yet approved or cleared by the [place] FDA and has been authorized for detection and/or diagnosis of SARS-CoV-2 by FDA under an Emergency Use Authorization (EUA). This EUA will remain in effect (meaning this test can be used) for the duration of the COVID-19 declaration under Section 564(b)(1) of the Act, 21 U.S.C. section 360bbb-3(b)(1), unless the authorization is terminated or revoked.  Performed at [facility], [place] [address], [place], [place] [address]      Labs: Basic Metabolic Panel: Recent Labs  Lab 12/13/20 1110 12/14/20 0314 12/15/20 0538 12/16/20 0235  NA 140 140 135 138  K 3.8 3.6 4.0 4.1  CL 103 102 99 103  CO2 '29 29 28 28  ' GLUCOSE 128* 67* 142* 92  BUN '13 12 10 13  ' CREATININE 0.85 0.84 0.89 0.79  CALCIUM 9.2 9.2 9.3 9.2   Liver Function Tests: No results for input(s): AST, ALT, ALKPHOS, BILITOT, PROT, ALBUMIN in  the last 168 hours. No results for input(s): LIPASE, AMYLASE in the last 168 hours. No results for input(s): AMMONIA in the last 168 hours. CBC: Recent Labs  Lab 12/14/20 0314 12/15/20 0538  WBC 5.2 4.3  NEUTROABS 1.5*  --   HGB 12.9* 14.0  HCT 39.7 40.6  MCV 88.8 85.3  PLT 254 246   Cardiac Enzymes: No results for input(s): CKTOTAL, CKMB, CKMBINDEX, TROPONINI in the last 168 hours. BNP: BNP (last 3 results) Recent Labs    10/30/20 1128 12/14/20 0314  BNP 112.2* 54.0    ProBNP (last 3 results) No results for input(s): PROBNP in the last 8760 hours.  CBG: Recent Labs  Lab 12/15/20 1622 12/15/20 2000 12/16/20 0003 12/16/20 0422 12/16/20 0801  GLUCAP 123* 76 90 97 162*       Signed:  Domenic Polite MD.  [facility] 12/16/2020, 11:54 AM

## 2020-12-18 ENCOUNTER — Other Ambulatory Visit (HOSPITAL_COMMUNITY): Payer: Self-pay

## 2020-12-18 NOTE — Progress Notes (Signed)
Paramedicine Encounter    Patient ID: Jason Stevenson, male    DOB: 05/23/1985, 35 y.o.   MRN: [id number]   Patient Care Team: Johnson, Deborah B, MD as PCP - General (Internal Medicine) Nishan, Peter C, MD as PCP - Cardiology (Cardiology)  Patient Active Problem List   Diagnosis Date Noted   Ischemic cardiomyopathy    Syncope 12/14/2020   Chest pain 10/30/2020   History of cerebrovascular accident (CVA) with residual deficit 10/27/2020   Protein-calorie malnutrition, severe 09/22/2020   Acute CVA (cerebrovascular accident) (HCC) 09/21/2020   Near syncope 09/21/2020   Encounter for monitoring Coumadin therapy 09/20/2020   LV (left ventricular) mural thrombus 09/13/2020   Seizure (HCC) 09/13/2020   Neurological deficit present 09/12/2020   History of COVID-19 09/12/2020   Intracerebral hemorrhage 09/12/2020   Acute cerebrovascular accident (CVA) (HCC) 09/12/2020   Hyperosmolar hyperglycemic state (HHS) (HCC) 09/11/2020   COVID-19 08/23/2020   Noncompliance with medication treatment due to intermittent use of medication 10/12/2018   Dyslipidemia 04/17/2016   Uncontrolled type 2 diabetes mellitus without complication, with long-term current use of insulin 08/07/2015   Chronic systolic CHF (congestive heart failure) (HCC) 06/18/2015   Needs flu shot 12/23/2013   Type 2 diabetes mellitus (HCC) 11/18/2013   Diabetes (HCC) 06/17/2013   Non-ischemic cardiomyopathy (HCC) 11/06/2012   HTN (hypertension) 11/06/2012   HLD (hyperlipidemia) 11/06/2012    Current Outpatient Medications:    acetaminophen (TYLENOL) 500 MG tablet, Take 1,000 mg by mouth every 8 (eight) hours as needed for mild pain, fever or headache., Disp: , Rfl:    aspirin EC 81 MG tablet, Take 81 mg by mouth daily. Swallow whole., Disp: , Rfl:    atorvastatin (LIPITOR) 80 MG tablet, Take 1 tablet (80 mg total) by mouth daily., Disp: 30 tablet, Rfl: 2   Blood Glucose Monitoring Suppl (TRUE METRIX METER) w/Device KIT, 1  each by Does not apply route 3 (three) times daily., Disp: 1 kit, Rfl: 0   Blood Pressure Monitor DEVI, Use as directed to check home blood pressure 2-3 times a week, Disp: 1 Device, Rfl: 0   digoxin (LANOXIN) 0.125 MG tablet, Take 1 tablet (0.125 mg total) by mouth daily., Disp: 30 tablet, Rfl: 2   ezetimibe (ZETIA) 10 MG tablet, Take 1 tablet (10 mg total) by mouth daily., Disp: 30 tablet, Rfl: 2   glucose blood (TRUE METRIX BLOOD GLUCOSE TEST) test strip, Use as instructed, Disp: 100 each, Rfl: 12   insulin isophane & regular human KwikPen (NOVOLIN 70/30 KWIKPEN) (70-30) 100 UNIT/ML KwikPen, 22 units SQ Q a.m and 18 units Q p.m (Patient taking differently: Inject 18-22 Units into the skin See admin instructions. 22 units in the mornig and 18 units in the evening), Disp: 13.2 mL, Rfl: 5   Insulin Pen Needle (TRUEPLUS 5-BEVEL PEN NEEDLES) 32G X 4 MM MISC, Use in the morning, at noon, and at bedtime., Disp: 100 each, Rfl: 2   Insulin Syringe-Needle U-100 (RELION INSULIN SYRINGE) 31G X 15/64" 0.3 ML MISC, Use to inject insulin daily., Disp: 100 each, Rfl: 11   ivabradine (CORLANOR) 7.5 MG TABS tablet, Take 1 tablet (7.5 mg total) by mouth 2 (two) times daily with a meal. (Patient taking differently: Take 11.25 mg by mouth 2 (two) times daily with a meal.), Disp: 60 tablet, Rfl:    levETIRAcetam (KEPPRA) 750 MG tablet, Take 2 tablets (1,500 mg total) by mouth 2 (two) times daily., Disp: 120 tablet, Rfl: 2   Menthol, Topical Analgesic, (ICY   HOT BACK EX), Apply 1 application topically as needed (neuropathy). Icy hot cream, Disp: , Rfl:    metFORMIN (GLUCOPHAGE) 500 MG tablet, Take 1 tablet (500 mg total) by mouth 2 (two) times daily with a meal., Disp: 60 tablet, Rfl: 4   metoprolol (TOPROL-XL) 200 MG 24 hr tablet, Take 1 tablet (200 mg total) by mouth daily. Take with or immediately following a meal., Disp: 30 tablet, Rfl: 2   Oxcarbazepine (TRILEPTAL) 300 MG tablet, Take 2 tablets (600 mg total) by mouth  2 (two) times daily., Disp: 120 tablet, Rfl: 2   phenytoin (DILANTIN) 100 MG ER capsule, Take 1 capsule (100 mg total) by mouth 3 (three) times daily., Disp: 90 capsule, Rfl: 2   pregabalin (LYRICA) 75 MG capsule, Take 1 capsule (75 mg total) by mouth 2 (two) times daily., Disp: 60 capsule, Rfl: 1   sacubitril-valsartan (ENTRESTO) 24-26 MG, Take 1 tablet by mouth 2 (two) times daily., Disp: 60 tablet, Rfl: 0   spironolactone (ALDACTONE) 25 MG tablet, Take 0.5 tablets (12.5 mg total) by mouth daily., Disp: , Rfl:    TRUEplus Lancets 28G MISC, Use as directed, Disp: 100 each, Rfl: 4   warfarin (COUMADIN) 5 MG tablet, Take 1.5 tablets (7.5 mg total) by mouth daily. (Patient taking differently: Take 5-7.5 mg by mouth See admin instructions. 7.5mg on Tuesday Thursday Saturday 5mg on Monday, Wednesday, Friday, Sunday), Disp: 45 tablet, Rfl: 1 No Known Allergies   Social History   Socioeconomic History   Marital status: Married    Spouse name: Not on file   Number of children: Not on file   Years of education: Not on file   Highest education level: Not on file  Occupational History   Occupation: unemployed  Tobacco Use   Smoking status: Never   Smokeless tobacco: Never  Vaping Use   Vaping Use: Not on file  Substance and Sexual Activity   Alcohol use: Not Currently    Comment: "occasional" when "hanging out with the wrong people" No recent use.   Drug use: Not Currently    Types: Marijuana    Comment: occasional, last 2013   Sexual activity: Not Currently  Other Topics Concern   Not on file  Social History Narrative   Pt lives alone. Has no information on father.   Social Determinants of Health   Financial Resource Strain: High Risk   Difficulty of Paying Living Expenses: Hard  Food Insecurity: No Food Insecurity   Worried About Running Out of Food in the Last Year: Never true   Ran Out of Food in the Last Year: Never true  Transportation Needs: Unmet Transportation Needs   Lack  of Transportation (Medical): No   Lack of Transportation (Non-Medical): Yes  Physical Activity: Not on file  Stress: Not on file  Social Connections: Not on file  Intimate Partner Violence: Not on file    Physical Exam Vitals reviewed.  Constitutional:      Appearance: Normal appearance. He is normal weight.  HENT:     Head: Normocephalic.     Nose: Nose normal.     Mouth/Throat:     Mouth: Mucous membranes are moist.     Pharynx: Oropharynx is clear.  Eyes:     Conjunctiva/sclera: Conjunctivae normal.     Pupils: Pupils are equal, round, and reactive to light.  Cardiovascular:     Rate and Rhythm: Normal rate and regular rhythm.     Pulses: Normal pulses.     Heart   sounds: Normal heart sounds.  Pulmonary:     Effort: Pulmonary effort is normal.     Breath sounds: Normal breath sounds.  Abdominal:     General: Abdomen is flat.     Palpations: Abdomen is soft.  Musculoskeletal:        General: No swelling. Normal range of motion.     Cervical back: Normal range of motion.     Right lower leg: No edema.     Left lower leg: No edema.  Skin:    General: Skin is warm and dry.     Capillary Refill: Capillary refill takes less than 2 seconds.  Neurological:     General: No focal deficit present.     Mental Status: He is alert. Mental status is at baseline.  Psychiatric:        Mood and Affect: Mood normal.    Arrived for home visit for Jason Stevenson who was alert and oriented reporting to be feeling okay today. He stated he is still struggling with polyneuropathy but using Tylenol, Lyrica and topical analgesics to help.   Jason Stevenson was recently admitted following a syncopal episode at home. He denied any dizziness, chest pain, shortness of breaths or feelings of faintness since his discharge.   He has been med compliant.   CBG 68 (before meal and meds)          231 (after breakfast)   I reviewed and verified notes and meds. I filled one week of pill box for Jason Stevenson in which was  missing several medications due to him not being able to pick them up due to his hospital stay. He will pick up same tomorrow following his coumadin appointment.List of where certain meds go are written for him and he and his wife confirmed they understood plan.  We reviewed appointments and confirmed same.    Lung sounds clear, vitals and assessment as noted.    I will see Jason Stevenson in one week.    Jason Stevenson received Repatha, but it isn't on his med list. I will verify with HF clinic on same.   Home visit complete.   Refills: [facility]- Dilantin Trilleptal   [facility]- Zetia Spironolactone Digoxin Metoprolol Atorvastatin Warfarin  [facility] CLINIC: [facility].  Future Appointments  Date Time Provider Department Center  12/19/2020  9:15 AM [facility]  12/20/2020  9:00 AM Penumalli, Vikram R, MD [facility] None  12/29/2020 11:00 AM [facility] PA/NP [facility] None  01/29/2021  8:00 AM [facility] OP 1 [facility]  01/29/2021  9:00 AM McLean, Dalton S, MD [facility] None  01/29/2021 10:30 AM Lambert, Cameron T, MD [facility]  01/30/2021  9:40 AM Crenshaw, Brian S, MD [facility]     ACTION: Home visit completed       

## 2020-12-19 ENCOUNTER — Other Ambulatory Visit: Payer: Self-pay

## 2020-12-19 ENCOUNTER — Ambulatory Visit (INDEPENDENT_AMBULATORY_CARE_PROVIDER_SITE_OTHER): Payer: Medicaid Other | Admitting: *Deleted

## 2020-12-19 ENCOUNTER — Other Ambulatory Visit (HOSPITAL_COMMUNITY): Payer: Self-pay

## 2020-12-19 ENCOUNTER — Telehealth: Payer: Self-pay

## 2020-12-19 DIAGNOSIS — Z7901 Long term (current) use of anticoagulants: Secondary | ICD-10-CM

## 2020-12-19 DIAGNOSIS — Z5181 Encounter for therapeutic drug level monitoring: Secondary | ICD-10-CM

## 2020-12-19 DIAGNOSIS — I639 Cerebral infarction, unspecified: Secondary | ICD-10-CM | POA: Diagnosis not present

## 2020-12-19 DIAGNOSIS — I513 Intracardiac thrombosis, not elsewhere classified: Secondary | ICD-10-CM | POA: Diagnosis not present

## 2020-12-19 LAB — POCT INR: INR: 1.7 — AB (ref 2.0–3.0)

## 2020-12-19 NOTE — Telephone Encounter (Signed)
Transition Care Management Follow-up Telephone Call Date of discharge and from where: 12/16/2020, [facility]  How have you been since you were released from the hospital? He said he continues to have a lot of " neuro" pain in his hands and feet and the pain keeps him up at night.  He has been taking the lyrica but said that is not helping. Informed him that Dr Laural Benes would be notified of this concern Any questions or concerns? Yes - noted above  Items Reviewed: Did the pt receive and understand the discharge instructions provided? Yes  Medications obtained and verified? Yes he said he now has all of his medications and Geraldine Solar, EMT assists him with setting up his medication box.  Other? No  Any new allergies since your discharge? No  Dietary orders reviewed? No Do you have support at home?  Lives with his wife  Home Care and Equipment/Supplies: Were home health services ordered? no If so, what is the name of the agency? N/a  Has the agency set up a time to come to the patient's home? not applicable Were any new equipment or medical supplies ordered?  No What is the name of the medical supply agency? N/a Were you able to get the supplies/equipment? not applicable Do you have any questions related to the use of the equipment or supplies? No  He receives weekly visits from Geraldine Solar, EMT/[employer] and he is very grateful for Heather's assistance with medication management.   His INR is monitored by the Coumadin clinic and went to his appointment there today.   Functional Questionnaire: (I = Independent and D = Dependent) ADLs: He said he is basically independent. His wife assists with checking his blood sugar and administering his insulin when she is available.   Follow up appointments reviewed:  PCP Hospital f/u appt confirmed? Yes  Scheduled to see Dr Laural Benes on 02/05/2021 @ 0930. Specialist Hospital f/u appt confirmed? Yes  Scheduled to  see neurology- 12/20/2020; cardiology - 12/29/2020,  Are transportation arrangements needed? No  - he uses [facility] [employer]  If their condition worsens, is the pt aware to call PCP or go to the Emergency Dept.? Yes Was the patient provided with contact information for the PCP's office or ED? Yes Was to pt encouraged to call back with questions or concerns? Yes

## 2020-12-19 NOTE — Patient Instructions (Signed)
Description   Today take 2 tablets then continue taking 1 tablet daily except for 1.5 tablets on Tuesdays, Thursdays, and Saturdays. Recheck in 2 weeks. [facility] [phone].

## 2020-12-20 ENCOUNTER — Encounter: Payer: Self-pay | Admitting: Diagnostic Neuroimaging

## 2020-12-20 ENCOUNTER — Ambulatory Visit (INDEPENDENT_AMBULATORY_CARE_PROVIDER_SITE_OTHER): Payer: Medicaid Other | Admitting: Diagnostic Neuroimaging

## 2020-12-20 VITALS — BP 122/84 | HR 106 | Ht 71.0 in | Wt 155.1 lb

## 2020-12-20 DIAGNOSIS — G40909 Epilepsy, unspecified, not intractable, without status epilepticus: Secondary | ICD-10-CM | POA: Diagnosis not present

## 2020-12-20 DIAGNOSIS — I63413 Cerebral infarction due to embolism of bilateral middle cerebral arteries: Secondary | ICD-10-CM

## 2020-12-20 NOTE — Patient Instructions (Signed)
  STROKE PREVENTION (embolic; cardiomyopathy) - continue coumadin, DM, BP, lipid control  SEIZURE DISORDER (due to stroke; last on 09/17/20) - continue levetiracetam 1500mg  twice a day, oxcarbazepine 600mg  twice a day, phenytoin 100mg  three times a day   - According to [place] law, you can not drive unless you are seizure / syncope free for at least 6 months and under physician's care.   - Please maintain precautions. Do not participate in activities where a loss of awareness could harm you or someone else. No swimming alone, no tub bathing, no hot tubs, no driving, no operating motorized vehicles (cars, ATVs, motocycles, etc), lawnmowers, power tools or firearms. No standing at heights, such as rooftops, ladders or stairs. Avoid hot objects such as stoves, heaters, open fires. Wear a helmet when riding a bicycle, scooter, skateboard, etc. and avoid areas of traffic. Set your water heater to 120 degrees or less.   DIABETIC NEUROPATHY PAIN - continue lyrica 75mg  twice a day

## 2020-12-20 NOTE — Progress Notes (Signed)
[facility]  PATIENT: Jason Stevenson DOB: [birth year]  REFERRING CLINICIAN: Fenton Foy, NP HISTORY FROM: patient  REASON FOR VISIT: new consult    HISTORICAL  CHIEF COMPLAINT:  Chief Complaint  Patient presents with   New Patient (Initial Visit)    Rm 6 alone. Here for consult on CVA workup completed back in June. Pt also went to the [facility] on 12/14/20 for syncopal event. Pt reports he been in pain a lot lately, mostly in in his legs and hands.     HISTORY OF PRESENT ILLNESS:   35 year old male with diabetes, nonischemic cardiomyopathy, CHF, hypertension, hyperlipidemia, presented to hospital on 09/11/2020 for left-sided twitching.  Blood glucose was greater than 600.  MRI showed multiple right frontal and left parietal subacute hemorrhagic infarcts, and right frontotemporal gyriform diffusion abnormality possibly related to seizure related changes.  Patient was treated for seizure and stroke prevention.  He was discharged on Coumadin anticoagulation and Keppra, Dilantin, Trileptal. He was again admitted to the hospital in December 16, 2020 for syncope.   REVIEW OF SYSTEMS: Full 14 system review of systems performed and negative with exception of: as per HPI.  ALLERGIES: No Known Allergies  HOME MEDICATIONS: Outpatient Medications Prior to Visit  Medication Sig Dispense Refill   acetaminophen (TYLENOL) 500 MG tablet Take 1,000 mg by mouth every 8 (eight) hours as needed for mild pain, fever or headache.     aspirin EC 81 MG tablet Take 81 mg by mouth daily. Swallow whole.     Blood Glucose Monitoring Suppl (TRUE METRIX METER) w/Device KIT 1 each by Does not apply route 3 (three) times daily. 1 kit 0   Blood Pressure Monitor DEVI Use as directed to check home blood pressure 2-3 times a week 1 Device 0   digoxin (LANOXIN) 0.125 MG tablet Take 1 tablet (0.125 mg total) by mouth daily. 30 tablet 2   ezetimibe (ZETIA) 10 MG tablet Take 1 tablet (10 mg total) by  mouth daily. 30 tablet 2   glucose blood (TRUE METRIX BLOOD GLUCOSE TEST) test strip Use as instructed 100 each 12   insulin isophane & regular human KwikPen (NOVOLIN 70/30 KWIKPEN) (70-30) 100 UNIT/ML KwikPen 22 units SQ Q a.m and 18 units Q p.m (Patient taking differently: Inject 18-22 Units into the skin See admin instructions. 22 units in the mornig and 18 units in the evening) 13.2 mL 5   Insulin Pen Needle (TRUEPLUS 5-BEVEL PEN NEEDLES) 32G X 4 MM MISC Use in the morning, at noon, and at bedtime. 100 each 2   Insulin Syringe-Needle U-100 (RELION INSULIN SYRINGE) 31G X 15/64" 0.3 ML MISC Use to inject insulin daily. 100 each 11   ivabradine (CORLANOR) 7.5 MG TABS tablet Take 1 tablet (7.5 mg total) by mouth 2 (two) times daily with a meal. (Patient taking differently: Take 11.25 mg by mouth 2 (two) times daily with a meal.) 60 tablet    levETIRAcetam (KEPPRA) 750 MG tablet Take 2 tablets (1,500 mg total) by mouth 2 (two) times daily. 120 tablet 2   Menthol, Topical Analgesic, (ICY HOT BACK EX) Apply 1 application topically as needed (neuropathy). Icy hot cream     metFORMIN (GLUCOPHAGE) 500 MG tablet Take 1 tablet (500 mg total) by mouth 2 (two) times daily with a meal. 60 tablet 4   metoprolol (TOPROL-XL) 200 MG 24 hr tablet Take 1 tablet (200 mg total) by mouth daily. Take with or immediately following a meal. 30 tablet 2  Oxcarbazepine (TRILEPTAL) 300 MG tablet Take 2 tablets (600 mg total) by mouth 2 (two) times daily. 120 tablet 2   phenytoin (DILANTIN) 100 MG ER capsule Take 1 capsule (100 mg total) by mouth 3 (three) times daily. 90 capsule 2   pregabalin (LYRICA) 75 MG capsule Take 1 capsule (75 mg total) by mouth 2 (two) times daily. 60 capsule 1   sacubitril-valsartan (ENTRESTO) 24-26 MG Take 1 tablet by mouth 2 (two) times daily. 60 tablet 0   spironolactone (ALDACTONE) 25 MG tablet Take 1 tablet (25 mg total) by mouth daily. 30 tablet 5   spironolactone (ALDACTONE) 25 MG tablet Take  0.5 tablets (12.5 mg total) by mouth daily.     TRUEplus Lancets 28G MISC Use as directed 100 each 4   warfarin (COUMADIN) 5 MG tablet Take 1.5 tablets (7.5 mg total) by mouth daily. (Patient taking differently: Take 5-7.5 mg by mouth See admin instructions. 7.78m on Tuesday Thursday Saturday 561mon Monday, Wednesday, Friday, Sunday) 45 tablet 1   atorvastatin (LIPITOR) 80 MG tablet Take 1 tablet (80 mg total) by mouth daily. (Patient not taking: No sig reported) 30 tablet 2   No facility-administered medications prior to visit.    PAST MEDICAL HISTORY: Past Medical History:  Diagnosis Date   Chronic systolic CHF (congestive heart failure) ([facility]   Di[place] without complication ([facility]   Hemorrhagic stroke ([facility]   Hyperlipidemia    Hypertension    Nonischemic cardiomyopathy ([facility] as early as 2007   Per chart review (cards consult note 2011), EF of 40% in 2007, down to 20-25% in 2011    PAST SURGICAL HISTORY: Past Surgical History:  Procedure Laterality Date   None     RIGHT/LEFT HEART CATH AND CORONARY ANGIOGRAPHY N/A 11/02/2020   Procedure: RIGHT/LEFT HEART CATH AND CORONARY ANGIOGRAPHY;  Surgeon: McLarey DresserMD;  [facility];  Service: Cardiovascular;  Laterality: N/A;    FAMILY HISTORY: Family History  Problem Relation Age of Onset   Stroke Mother    Diabetes Mother    Hypertension Mother    Stroke Maternal Aunt    Heart attack Neg Hx     SOCIAL HISTORY: Social History   Socioeconomic History   Marital status: Married    Spouse name: Not on file   Number of children: Not on file   Years of education: Not on file   Highest education level: Not on file  Occupational History   Occupation: unemployed  Tobacco Use   Smoking status: Never   Smokeless tobacco: Never  Vaping Use   Vaping Use: Not on file  Substance and Sexual Activity   Alcohol use: Not Currently    Comment: "occasional" when "hanging out with the  wrong people" No recent use.   Drug use: Not Currently    Types: Marijuana    Comment: occasional, last 2013   Sexual activity: Not Currently  Other Topics Concern   Not on file  Social History Narrative   Pt lives at home with his wife    Right handed    Caffeine- hardly any   Social Determinants of Health   Financial Resource Strain: High Risk   Difficulty of Paying Living Expenses: Hard  Food Insecurity: No Food Insecurity   Worried About Running Out of Food in the Last Year: Never true   Ran Out of Food in the Last Year: Never true  Transportation Needs: Unmet Transportation Needs   Lack  of Transportation (Medical): No   Lack of Transportation (Non-Medical): Yes  Physical Activity: Not on file  Stress: Not on file  Social Connections: Not on file  Intimate Partner Violence: Not on file     PHYSICAL EXAM  GENERAL EXAM/CONSTITUTIONAL: Vitals:  Vitals:   12/20/20 0900  BP: 122/84  Pulse: (!) 106  SpO2: 99%  Weight: 155 lb 2 oz (70.4 kg)  Height: _0  (1.803 m)   Body mass index is 21.64 kg/m. Wt Readings from Last 3 Encounters:  12/20/20 155 lb 2 oz (70.4 kg)  12/18/20 147 lb 3.2 oz (66.8 kg)  12/16/20 145 lb 1 oz (65.8 kg)   Patient is in no distress; well developed, nourished and groomed; neck is supple  CARDIOVASCULAR: Examination of carotid arteries is normal; no carotid bruits Regular rate and rhythm, no murmurs Examination of peripheral vascular system by observation and palpation is normal  EYES: Ophthalmoscopic exam of optic discs and posterior segments is normal; no papilledema or hemorrhages No results found.  MUSCULOSKELETAL: Gait, strength, tone, movements noted in Neurologic exam below  NEUROLOGIC: MENTAL STATUS:  No flowsheet data found. awake, alert, oriented to person, place and time recent and remote memory intact normal attention and concentration language fluent, comprehension intact, naming intact fund of knowledge  appropriate  CRANIAL NERVE:  2nd - no papilledema on fundoscopic exam 2nd, 3rd, 4th, 6th - pupils equal and reactive to light, visual fields full to confrontation, extraocular muscles intact, no nystagmus 5th - facial sensation symmetric 7th - facial strength symmetric 8th - hearing intact 9th - palate elevates symmetrically, uvula midline 11th - shoulder shrug symmetric 12th - tongue protrusion midline  MOTOR:  normal bulk and tone, DIFFUSE 4+ strength in the BUE, BLE  SENSORY:  normal and symmetric to light touch, temperature, vibration  COORDINATION:  finger-nose-finger, fine finger movements SLOW  REFLEXES:  deep tendon reflexes TRACE and symmetric  GAIT/STATION:  narrow based gait     DIAGNOSTIC DATA (LABS, IMAGING, TESTING) - I reviewed patient records, labs, notes, testing and imaging myself where available.  Lab Results  Component Value Date   WBC 4.3 12/15/2020   HGB 14.0 12/15/2020   HCT 40.6 12/15/2020   MCV 85.3 12/15/2020   PLT 246 12/15/2020      Component Value Date/Time   NA 138 12/16/2020 0235   NA 139 08/04/2019 0952   K 4.1 12/16/2020 0235   CL 103 12/16/2020 0235   CO2 28 12/16/2020 0235   GLUCOSE 92 12/16/2020 0235   BUN 13 12/16/2020 0235   BUN 11 08/04/2019 0952   CREATININE 0.79 12/16/2020 0235   CREATININE 0.80 04/17/2016 1204   CALCIUM 9.2 12/16/2020 0235   PROT 6.5 09/22/2020 0503   PROT 7.2 11/26/2019 0858   ALBUMIN 3.0 (L) 09/22/2020 0503   ALBUMIN 4.6 11/26/2019 0858   AST 36 09/22/2020 0503   ALT 42 09/22/2020 0503   ALKPHOS 78 09/22/2020 0503   BILITOT 0.5 09/22/2020 0503   BILITOT 0.4 11/26/2019 0858   GFRNONAA >60 12/16/2020 0235   GFRNONAA >89 04/17/2016 1204   GFRAA 138 08/04/2019 0952   GFRAA >89 04/17/2016 1204   Lab Results  Component Value Date   CHOL 153 12/04/2020   HDL 49 12/04/2020   LDLCALC 81 12/04/2020   TRIG 115 12/04/2020   CHOLHDL 3.1 12/04/2020   Lab Results  Component Value Date   HGBA1C  8.3 (H) 12/14/2020   Lab Results  Component Value Date   VITAMINB12  1,427 (H) 11/26/2019   Lab Results  Component Value Date   TSH 1.741 12/14/2020    09/11/20 MRI brain 1. Probable subacute hemorrhagic infarcts involving the anterior right frontal lobe and left parieto-occipital cortex as above, with additional chronic right parietal infarct. 2. Superimposed gyriform diffusion abnormality and enhancement involving the right frontotemporal region, favored to reflect changes of acute seizure. Correlation with EEG recommended. Possible ischemic change would be the primary differential consideration. 3. Abnormal symmetric T2/FLAIR signal abnormality involving the caudate and lentiform nuclei bilaterally, nonspecific, but most suggestive of superimposed/concomitant toxic metabolic derangement or other toxic insult. Correlation with history and laboratory values recommended. 4. A short interval follow-up brain MRI to ensure that these changes resolve is recommended.  09/21/20 MRI brain, MRA head / neck: 1. No acute intracranial abnormality. No significant change from prior MRI. 2. Increased T2 signal in the right frontal cortex, bilateral basal ganglia and radiating white matter tracts appear unchanged. Differential considerations remain the same including post ictal and metabolic derangement. 3. Unremarkable MR angiogram of the head and neck.    ASSESSMENT AND PLAN  35 y.o. year old male here with   Dx:  1. 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 Seizure disorder ([place])      PLAN:  STROKE PREVENTION (embolic; cardiomyopathy) - continue coumadin, DM, BP, lipid control  SEIZURE DISORDER (due to stroke; last on 09/17/20) - continue levetiracetam 1571m twice a day, oxcarbazepine 6083mtwice a day, phenytoin 10048mhree times a day; may consider reduced dosing in future  - According to [place] law, you can not drive unless  you are seizure / syncope free for at least 6 months and under physician's care.   - Please maintain precautions. Do not participate in activities where a loss of awareness could harm you or someone else. No swimming alone, no tub bathing, no hot tubs, no driving, no operating motorized vehicles (cars, ATVs, motocycles, etc), lawnmowers, power tools or firearms. No standing at heights, such as rooftops, ladders or stairs. Avoid hot objects such as stoves, heaters, open fires. Wear a helmet when riding a bicycle, scooter, skateboard, etc. and avoid areas of traffic. Set your water heater to 120 degrees or less.   DIABETIC NEUROPATHY PAIN - continue lyrica 67m89mice a day  Return in about 6 months (around 06/19/2021).  I reviewed images, labs, notes, records myself. I summarized findings and reviewed with patient, for this high risk condition (stroke, seizure) requiring high complexity decision making.     VIKRPenni Bombard 9/7/7/6/[id number]:00Certified in Neurology, Neurophysiology and Neuroimaging  [facility] [id number] StreetitSigurdeCape Coral [phone]

## 2020-12-22 ENCOUNTER — Encounter: Payer: Self-pay | Admitting: Internal Medicine

## 2020-12-25 ENCOUNTER — Other Ambulatory Visit (HOSPITAL_COMMUNITY): Payer: Self-pay

## 2020-12-25 ENCOUNTER — Telehealth (HOSPITAL_COMMUNITY): Payer: Self-pay | Admitting: Licensed Clinical Social Worker

## 2020-12-25 NOTE — Progress Notes (Signed)
Paramedicine Encounter    Patient ID: Jason Stevenson, male    DOB: [birth year], 35 y.o.   MRN: [id number]   Patient Care Team: Ladell Pier, MD as PCP - General (Internal Medicine) Josue Hector, MD as PCP - Cardiology (Cardiology)  Patient Active Problem List   Diagnosis Date Noted   Ischemic cardiomyopathy    Syncope 12/14/2020   Chest pain 10/30/2020   History of cerebrovascular accident (CVA) with residual deficit 10/27/2020   Protein-calorie malnutrition, severe 09/22/2020   Acute CVA (cerebrovascular accident) ([place]) 09/21/2020   Near syncope 09/21/2020   Encounter for monitoring Coumadin therapy 09/20/2020   LV (left ventricular) mural thrombus 09/13/2020   Seizure ([place]) 09/13/2020   Neurological deficit present 09/12/2020   History of COVID-19 09/12/2020   Intracerebral hemorrhage 09/12/2020   Acute cerebrovascular accident (CVA) ([place]) 09/12/2020   Hyperosmolar hyperglycemic state (HHS) ([place]) 09/11/2020   COVID-19 08/23/2020   Noncompliance with medication treatment due to intermittent use of medication 10/12/2018   Dyslipidemia 04/17/2016   Uncontrolled type 2 diabetes mellitus without complication, with long-term current use of insulin 46/27/0350   Chronic systolic CHF (congestive heart failure) ([place]) 06/18/2015   Needs flu shot 12/23/2013   Type 2 diabetes mellitus ([place]) 11/18/2013   Diabetes ([place]) 06/17/2013   Non-ischemic cardiomyopathy ([place]) 11/06/2012   HTN (hypertension) 11/06/2012   HLD (hyperlipidemia) 11/06/2012    Current Outpatient Medications:    acetaminophen (TYLENOL) 500 MG tablet, Take 1,000 mg by mouth every 8 (eight) hours as needed for mild pain, fever or headache., Disp: , Rfl:    aspirin EC 81 MG tablet, Take 81 mg by mouth daily. Swallow whole., Disp: , Rfl:    atorvastatin (LIPITOR) 80 MG tablet, Take 1 tablet (80 mg total) by mouth daily. (Patient not taking: No sig reported), Disp: 30 tablet, Rfl: 2   Blood Glucose Monitoring Suppl  (TRUE METRIX METER) w/Device KIT, 1 each by Does not apply route 3 (three) times daily., Disp: 1 kit, Rfl: 0   Blood Pressure Monitor DEVI, Use as directed to check home blood pressure 2-3 times a week, Disp: 1 Device, Rfl: 0   digoxin (LANOXIN) 0.125 MG tablet, Take 1 tablet (0.125 mg total) by mouth daily., Disp: 30 tablet, Rfl: 2   ezetimibe (ZETIA) 10 MG tablet, Take 1 tablet (10 mg total) by mouth daily., Disp: 30 tablet, Rfl: 2   glucose blood (TRUE METRIX BLOOD GLUCOSE TEST) test strip, Use as instructed, Disp: 100 each, Rfl: 12   insulin isophane & regular human KwikPen (NOVOLIN 70/30 KWIKPEN) (70-30) 100 UNIT/ML KwikPen, 22 units SQ Q a.m and 18 units Q p.m (Patient taking differently: Inject 18-22 Units into the skin See admin instructions. 22 units in the mornig and 18 units in the evening), Disp: 13.2 mL, Rfl: 5   Insulin Pen Needle (TRUEPLUS 5-BEVEL PEN NEEDLES) 32G X 4 MM MISC, Use in the morning, at noon, and at bedtime., Disp: 100 each, Rfl: 2   Insulin Syringe-Needle U-100 (RELION INSULIN SYRINGE) 31G X 15/64" 0.3 ML MISC, Use to inject insulin daily., Disp: 100 each, Rfl: 11   ivabradine (CORLANOR) 7.5 MG TABS tablet, Take 1 tablet (7.5 mg total) by mouth 2 (two) times daily with a meal. (Patient taking differently: Take 11.25 mg by mouth 2 (two) times daily with a meal.), Disp: 60 tablet, Rfl:    levETIRAcetam (KEPPRA) 750 MG tablet, Take 2 tablets (1,500 mg total) by mouth 2 (two) times daily., Disp: 120 tablet, Rfl: 2  Menthol, Topical Analgesic, (ICY HOT BACK EX), Apply 1 application topically as needed (neuropathy). Icy hot cream, Disp: , Rfl:    metFORMIN (GLUCOPHAGE) 500 MG tablet, Take 1 tablet (500 mg total) by mouth 2 (two) times daily with a meal., Disp: 60 tablet, Rfl: 4   metoprolol (TOPROL-XL) 200 MG 24 hr tablet, Take 1 tablet (200 mg total) by mouth daily. Take with or immediately following a meal., Disp: 30 tablet, Rfl: 2   Oxcarbazepine (TRILEPTAL) 300 MG tablet,  Take 2 tablets (600 mg total) by mouth 2 (two) times daily., Disp: 120 tablet, Rfl: 2   phenytoin (DILANTIN) 100 MG ER capsule, Take 1 capsule (100 mg total) by mouth 3 (three) times daily., Disp: 90 capsule, Rfl: 2   pregabalin (LYRICA) 75 MG capsule, Take 1 capsule (75 mg total) by mouth 2 (two) times daily., Disp: 60 capsule, Rfl: 1   sacubitril-valsartan (ENTRESTO) 24-26 MG, Take 1 tablet by mouth 2 (two) times daily., Disp: 60 tablet, Rfl: 0   spironolactone (ALDACTONE) 25 MG tablet, Take 1 tablet (25 mg total) by mouth daily., Disp: 30 tablet, Rfl: 5   spironolactone (ALDACTONE) 25 MG tablet, Take 0.5 tablets (12.5 mg total) by mouth daily., Disp: , Rfl:    TRUEplus Lancets 28G MISC, Use as directed, Disp: 100 each, Rfl: 4   warfarin (COUMADIN) 5 MG tablet, Take 1.5 tablets (7.5 mg total) by mouth daily. (Patient taking differently: Take 5-7.5 mg by mouth See admin instructions. 7.15m on Tuesday Thursday Saturday 532mon Monday, Wednesday, Friday, Sunday), Disp: 45 tablet, Rfl: 1 No Known Allergies   Social History   Socioeconomic History   Marital status: Married    Spouse name: Not on file   Number of children: Not on file   Years of education: Not on file   Highest education level: Not on file  Occupational History   Occupation: unemployed  Tobacco Use   Smoking status: Never   Smokeless tobacco: Never  Vaping Use   Vaping Use: Not on file  Substance and Sexual Activity   Alcohol use: Not Currently    Comment: "occasional" when "hanging out with the wrong people" No recent use.   Drug use: Not Currently    Types: Marijuana    Comment: occasional, last 2013   Sexual activity: Not Currently  Other Topics Concern   Not on file  Social History Narrative   Pt lives at home with his wife    Right handed    Caffeine- hardly any   Social Determinants of Health   Financial Resource Strain: High Risk   Difficulty of Paying Living Expenses: Hard  Food Insecurity: No Food  Insecurity   Worried About RuCharity fundraisern the Last Year: Never true   Ran Out of Food in the Last Year: Never true  Transportation Needs: Unmet Transportation Needs   Lack of Transportation (Medical): No   Lack of Transportation (Non-Medical): Yes  Physical Activity: Not on file  Stress: Not on file  Social Connections: Not on file  Intimate Partner Violence: Not on file    Physical Exam Vitals reviewed.  Constitutional:      Appearance: Normal appearance. He is normal weight.  HENT:     Head: Normocephalic.     Nose: Nose normal.     Mouth/Throat:     Mouth: Mucous membranes are moist.     Pharynx: Oropharynx is clear.  Eyes:     Conjunctiva/sclera: Conjunctivae normal.  Pupils: Pupils are equal, round, and reactive to light.  Cardiovascular:     Rate and Rhythm: Normal rate and regular rhythm.     Pulses: Normal pulses.     Heart sounds: Normal heart sounds.  Pulmonary:     Effort: Pulmonary effort is normal.     Breath sounds: Normal breath sounds.  Abdominal:     General: Abdomen is flat.     Palpations: Abdomen is soft.  Musculoskeletal:        General: No swelling. Normal range of motion.     Cervical back: Normal range of motion.     Right lower leg: No edema.     Left lower leg: No edema.  Skin:    General: Skin is warm and dry.     Capillary Refill: Capillary refill takes less than 2 seconds.  Neurological:     General: No focal deficit present.     Mental Status: He is alert. Mental status is at baseline.  Psychiatric:        Mood and Affect: Mood normal.    Arrived for home visit for Gabriella who reports feeling okay aside of his constant neuropathy in his hands and feet. He reports the medication changes have not helped. Dashun has been med-complaint over the last week. He has been checking his weight, sugar and blood pressure at home. All have been within normal limits.   I reviewed medications and filled pill box accordingly.   We reviewed  upcoming appointments and confirmed Friday with ride pick up at 10:35. I will meet Yandriel there.   He got a letter from disability reporting they are needing more info from [facility], I forwarded same to Tammy Sours LCSW for assistance.   Lung sounds clear, no edema, no JVD. Maleke denied shortness of breath, chest pain or dizziness.   Home visit complete. I will see him Friday in clinic.   Refills: Metformin Pregabalin Insulin    Future Appointments  Date Time Provider [place]  12/29/2020 11:00 AM [facility] PA/NP [facility] None  01/02/2021  8:45 AM [facility]  01/29/2021  8:00 AM MC ECHO OP 1 [facility]  01/29/2021  9:00 AM Larey Dresser, MD MC-[facility]SC None  01/29/2021 10:30 AM Vickie Epley, MD [facility]  01/30/2021  9:40 AM Lelon Perla, MD [facility]  02/05/2021  9:30 AM Ladell Pier, MD CHW-CHWW None  06/26/2021  9:30 AM Penumalli, Earlean Polka, MD GNA-GNA None     ACTION: Home visit completed

## 2020-12-25 NOTE — Telephone Encounter (Signed)
Community Paramedic informed CSW that pt Medicaid case with DDS is delayed due to them having insufficient clinical information.  CSW called pt case worker:  Cicero [phone] ext [phone]  Unable to reach- left VM requesting return call  Burna Sis, LCSW Clinical Social Worker [facility] Desk#: [phone] Cell#: [phone]

## 2020-12-26 ENCOUNTER — Encounter: Payer: Self-pay | Admitting: Internal Medicine

## 2020-12-26 ENCOUNTER — Other Ambulatory Visit: Payer: Self-pay

## 2020-12-26 DIAGNOSIS — E1142 Type 2 diabetes mellitus with diabetic polyneuropathy: Secondary | ICD-10-CM

## 2020-12-26 DIAGNOSIS — Z794 Long term (current) use of insulin: Secondary | ICD-10-CM

## 2020-12-26 MED ORDER — PREGABALIN 100 MG PO CAPS
100.0000 mg | ORAL_CAPSULE | Freq: Two times a day (BID) | ORAL | 2 refills | Status: DC
Start: 2020-12-26 — End: 2021-01-27
  Filled 2020-12-26: qty 60, 30d supply, fill #0

## 2020-12-27 ENCOUNTER — Telehealth (HOSPITAL_COMMUNITY): Payer: Self-pay

## 2020-12-27 ENCOUNTER — Encounter (HOSPITAL_COMMUNITY): Payer: Self-pay | Admitting: Adult Health

## 2020-12-27 ENCOUNTER — Other Ambulatory Visit: Payer: Self-pay

## 2020-12-27 NOTE — Telephone Encounter (Signed)
Called to confirm/remind patient of their appointment at the [facility] on 12/28/20.   Patient reminded to bring all medications and/or complete list.  Confirmed patient has transportation. Gave directions, instructed to utilize valet parking.  Confirmed appointment prior to ending call.   

## 2020-12-27 NOTE — Progress Notes (Unsigned)
HF Paramedicine Team Based Care Meeting  HF MD- NA  HF NP - Jason Haning NP-C   [facility] HF Paramedicine  [name] admit within the last 30 days for heart failure? Yes 9/1 Syncopal  Medications concerns? Yes HF fund  Transportation issues ? [facility]   Education needs? No   SDOH - Medicaid pending . Disability pending   Eligible for discharge?  No    Jason Guastella NP-C 3:50 PM

## 2020-12-28 ENCOUNTER — Encounter (HOSPITAL_COMMUNITY): Payer: Self-pay

## 2020-12-28 ENCOUNTER — Other Ambulatory Visit: Payer: Self-pay

## 2020-12-28 ENCOUNTER — Ambulatory Visit (HOSPITAL_COMMUNITY)
Admission: RE | Admit: 2020-12-28 | Discharge: 2020-12-28 | Disposition: A | Discharge status: Home / Self Care | Payer: Medicaid Other | Source: Ambulatory Visit | Attending: Family Medicine | Admitting: Family Medicine

## 2020-12-28 ENCOUNTER — Telehealth (HOSPITAL_COMMUNITY): Payer: Self-pay | Admitting: Licensed Clinical Social Worker

## 2020-12-28 ENCOUNTER — Other Ambulatory Visit (HOSPITAL_COMMUNITY): Payer: Self-pay

## 2020-12-28 VITALS — BP 98/69 | HR 95 | Wt 154.0 lb

## 2020-12-28 DIAGNOSIS — R55 Syncope and collapse: Secondary | ICD-10-CM | POA: Diagnosis not present

## 2020-12-28 DIAGNOSIS — I5022 Chronic systolic (congestive) heart failure: Secondary | ICD-10-CM | POA: Diagnosis not present

## 2020-12-28 DIAGNOSIS — Z79899 Other long term (current) drug therapy: Secondary | ICD-10-CM | POA: Insufficient documentation

## 2020-12-28 DIAGNOSIS — E785 Hyperlipidemia, unspecified: Secondary | ICD-10-CM | POA: Insufficient documentation

## 2020-12-28 DIAGNOSIS — I110 Hypertensive heart disease with heart failure: Secondary | ICD-10-CM | POA: Diagnosis not present

## 2020-12-28 DIAGNOSIS — Z7982 Long term (current) use of aspirin: Secondary | ICD-10-CM | POA: Insufficient documentation

## 2020-12-28 DIAGNOSIS — R200 Anesthesia of skin: Secondary | ICD-10-CM | POA: Diagnosis not present

## 2020-12-28 DIAGNOSIS — R202 Paresthesia of skin: Secondary | ICD-10-CM | POA: Diagnosis not present

## 2020-12-28 DIAGNOSIS — Z560 Unemployment, unspecified: Secondary | ICD-10-CM | POA: Diagnosis not present

## 2020-12-28 DIAGNOSIS — I639 Cerebral infarction, unspecified: Secondary | ICD-10-CM | POA: Diagnosis not present

## 2020-12-28 DIAGNOSIS — I428 Other cardiomyopathies: Secondary | ICD-10-CM | POA: Insufficient documentation

## 2020-12-28 DIAGNOSIS — Z833 Family history of diabetes mellitus: Secondary | ICD-10-CM | POA: Insufficient documentation

## 2020-12-28 DIAGNOSIS — Z8616 Personal history of COVID-19: Secondary | ICD-10-CM | POA: Insufficient documentation

## 2020-12-28 DIAGNOSIS — E1169 Type 2 diabetes mellitus with other specified complication: Secondary | ICD-10-CM

## 2020-12-28 DIAGNOSIS — Z596 Low income: Secondary | ICD-10-CM | POA: Insufficient documentation

## 2020-12-28 DIAGNOSIS — Z7901 Long term (current) use of anticoagulants: Secondary | ICD-10-CM | POA: Insufficient documentation

## 2020-12-28 DIAGNOSIS — R569 Unspecified convulsions: Secondary | ICD-10-CM

## 2020-12-28 DIAGNOSIS — Z794 Long term (current) use of insulin: Secondary | ICD-10-CM | POA: Insufficient documentation

## 2020-12-28 DIAGNOSIS — Z8249 Family history of ischemic heart disease and other diseases of the circulatory system: Secondary | ICD-10-CM | POA: Diagnosis not present

## 2020-12-28 DIAGNOSIS — I513 Intracardiac thrombosis, not elsewhere classified: Secondary | ICD-10-CM | POA: Diagnosis not present

## 2020-12-28 DIAGNOSIS — E1165 Type 2 diabetes mellitus with hyperglycemia: Secondary | ICD-10-CM | POA: Diagnosis not present

## 2020-12-28 LAB — BASIC METABOLIC PANEL
Anion gap: 7 (ref 5–15)
BUN: 12 mg/dL (ref 6–20)
CO2: 29 mmol/L (ref 22–32)
Calcium: 8.6 mg/dL — ABNORMAL LOW (ref 8.9–10.3)
Chloride: 102 mmol/L (ref 98–111)
Creatinine, Ser: 0.85 mg/dL (ref 0.61–1.24)
GFR, Estimated: >60 mL/min (ref >60)
Glucose, Bld: 109 mg/dL — ABNORMAL HIGH (ref 70–99)
Potassium: 3.5 mmol/L (ref 3.5–5.1)
Sodium: 138 mmol/L (ref 135–145)

## 2020-12-28 LAB — CBC
HCT: 38.1 % — ABNORMAL LOW (ref 39.0–52.0)
Hemoglobin: 12.8 g/dL — ABNORMAL LOW (ref 13.0–17.0)
MCH: 29.1 pg (ref 26.0–34.0)
MCHC: 33.6 g/dL (ref 30.0–36.0)
MCV: 86.6 fL (ref 80.0–100.0)
Platelets: 259 10*3/uL (ref 150–400)
RBC: 4.4 MIL/uL (ref 4.22–5.81)
RDW: 11.6 % (ref 11.5–15.5)
WBC: 4.2 10*3/uL (ref 4.0–10.5)
nRBC: 0 % (ref 0.0–0.2)

## 2020-12-28 LAB — DIGOXIN LEVEL: Digoxin Level: 0.4 ng/mL — ABNORMAL LOW (ref 0.8–2.0)

## 2020-12-28 MED ORDER — SPIRONOLACTONE 25 MG PO TABS
12.5000 mg | ORAL_TABLET | Freq: Every day | ORAL | 3 refills | Status: DC
  Filled 2020-12-28: qty 45, 90d supply, fill #0

## 2020-12-28 NOTE — Progress Notes (Signed)
ReDS Vest / Clip - 12/28/20 1500       ReDS Vest / Clip   Station Marker C    Ruler Value 27    ReDS Value Range Low volume    ReDS Actual Value 35

## 2020-12-28 NOTE — Telephone Encounter (Signed)
CSW able to speak with pt DDS case worker this morning and confirm what additional documentation they need to process his Medicaid case.  Document request/release is scanned into system- CSW faxed case worker requested documents and provided with CSW contact information to let me know if they need any additional documentation  Will continue to follow and assist as needed  Burna Sis, LCSW Clinical Social Worker [facility] Desk#: [phone] Cell#: [phone]

## 2020-12-28 NOTE — Progress Notes (Signed)
Paramedicine Encounter    Patient ID: Jason Stevenson, male    DOB: Dec 09, 1985, 35 y.o.   MRN: [id number]   Met with Jason Stevenson in clinic today where he was seen by NP Jason Stevenson. Jason Stevenson reports feeling fatigued and sometimes feels faint. He has not had any more syncopal episodes since his Stevenson stay. He does report lack of appetite and hydration as well.  Jason Stevenson encouraged him to increase his food and water intake and to be sure he is med compliant.   We reviewed medications. Spironolactone confirmed at 12.51m daily. I filled pill box accordingly.   Corlanor samples provided- Jason Stevenson should be shipping same out.   CPX test scheduled for next Tuesday at 1:30. Transportation set up for this and his [place] visit.   9/20 Coumadin Clinic at [address] 9/20 CPX TEST AT 1[place]  Appointment with Dr. LQuentin Stevenson to 10/26 at 1:15   I will see Jason Stevenson one week.   Refills: Entresto    ACTION: Home visit completed

## 2020-12-28 NOTE — Progress Notes (Signed)
PCP: Dr. Karle Plumber Primary Cardiologist: Dr. Aundra Dubin  HPI: 35 y.o.male with diabetes, HTN, and a long history of nonischemic cardiomyopathy. He had a cardiac MRI in 1/08 showing low EF, but he says that he had been told about "heart problems" even prior to that.  He does not have a family history of cardiomyopathy that he knows of, but does not know his father's family.  Coronary CT angiogram in 2014 showed no CAD. Echo in 2/17 showed EF 20-25%, similar to 2014.  He has generally had sporadic followup over the last few years, but has been seeing Dr Johnsie Cancel recently. He was seen in the [facility] in 2017 but no f/u since. Last f/u w/ Cardiology was in 2020. Had repeat echo in 2020 showing LVEF 15-20%. RV normal. Most recently, he was diagnosed w/ COVID in April.  He presented to [facility] ED 09/11/20 with tingling in left arm, left face, drooling and blank stare for about 1 week, and admitted for possible seizure, HHS and possible CVA.  In ED, he had left-sided neurologic deficit with dysarthria.  CT head with probable subacute infarct in right frontal lobe and old right parietal infarct.  Reportedly, patient's neurologic deficits resolved in ED, and focus shifted to possible seizure versus stroke.  He was loaded with Keppra.   MRI brain with probable hemorrhagic infarct involving the anterior right frontal lobe and left parietal occipital cortex with additional chronic right parietal infarct, right frontotemporal region enhancement suggesting changes related to acute seizure, possible toxic insult involving the caudate and lentiform nuclei bilaterally.  Neurology recommended interval follow-up brain MRI.  CTA head and neck without significant finding.   TTE with EF of 10-15%, LV thrombus, moderate RV dysfunction and dilated IVC. He was discharged on GDMT, but A1c too high for SGLT2i.    Seen in [facility] ED 09/21/20 for fall after syncopal episode on toilet, hitting his head. CT of the head was significant for no bleeding  but new low-density left cerebellum suggesting acute infarct. EEG unremarkable and felt fall secondary to vagal episode not seizure.  He had atypical chest pain at his hospital follow up. With his history of HLD and DM, decisions was made to undergo [facility] to evaluate coronary anatomy and hemodynamics.  R/LHC (7/22) showed normal filling pressures, preserved cardiac output and no significant CAD. RA mean 1, RV 28/1, PA 19/7 mean 12, PCWP 3, CO/CI 5.54/2.92.  Last seen in the HF clinic on 12/04/20 and was doing ok. Entresto was increased to 49-51 mg twice a day. Lasix was changed to as needed.  Admitted 12/14/20-12/16/20 for syncopal episode. CT head negative. He was given IVF, beta blocker/spiro/Entresto held initially. Suspected orthostatic vs micturition syncope. EP consulted with known low EF and plans for ICD implant when a1c better controlled.  Today he returns for post hospital HF follow up with paramedicine. Still gets dyspneic walking on flat ground. Some intermittent atypical chest pain, resolves with deep breaths. Main concern is numbness and tingling in right hand. His PCP has increased his Lyrica for this and he continues to try and wear a wrist splint at night. Denies further dizziness, edema, or PND/Orthopnea. Appetite poor. No fever or chills. Weight at home 150 pounds. Taking all medications. Followed by paramedicine.  ROS: All systems negative except as listed in HPI, PMH and Problem List.  SH:  Social History   Socioeconomic History   Marital status: Married    Spouse name: Not on file   Number of children: Not on  file   Years of education: Not on file   Highest education level: Not on file  Occupational History   Occupation: unemployed  Tobacco Use   Smoking status: Never   Smokeless tobacco: Never  Vaping Use   Vaping Use: Not on file  Substance and Sexual Activity   Alcohol use: Not Currently    Comment: "occasional" when "hanging out with the wrong people" No recent  use.   Drug use: Not Currently    Types: Marijuana    Comment: occasional, last 2013   Sexual activity: Not Currently  Other Topics Concern   Not on file  Social History Narrative   Pt lives at home with his wife    Right handed    Caffeine- hardly any   Social Determinants of Health   Financial Resource Strain: High Risk   Difficulty of Paying Living Expenses: Hard  Food Insecurity: No Food Insecurity   Worried About Charity fundraiser in the Last Year: Never true   Arboriculturist in the Last Year: Never true  Transportation Needs: Unmet Transportation Needs   Lack of Transportation (Medical): No   Lack of Transportation (Non-Medical): Yes  Physical Activity: Not on file  Stress: Not on file  Social Connections: Not on file  Intimate Partner Violence: Not on file   FH:  Family History  Problem Relation Age of Onset   Stroke Mother    Diabetes Mother    Hypertension Mother    Stroke Maternal Aunt    Heart attack Neg Hx    Past Medical History:  Diagnosis Date   Chronic systolic CHF (congestive heart failure) ([place]) 06/18/2015   Diabetes mellitus without complication ([place])    Hemorrhagic stroke ([place])    Hyperlipidemia    Hypertension    Nonischemic cardiomyopathy ([place]) Noted as early as 2007   Per chart review (cards consult note 2011), EF of 40% in 2007, down to 20-25% in 2011   Current Outpatient Medications  Medication Sig Dispense Refill   acetaminophen (TYLENOL) 500 MG tablet Take 1,000 mg by mouth every 8 (eight) hours as needed for mild pain, fever or headache.     aspirin EC 81 MG tablet Take 81 mg by mouth daily. Swallow whole.     atorvastatin (LIPITOR) 80 MG tablet Take 1 tablet (80 mg total) by mouth daily. 30 tablet 2   Blood Glucose Monitoring Suppl (TRUE METRIX METER) w/Device KIT 1 each by Does not apply route 3 (three) times daily. 1 kit 0   Blood Pressure Monitor DEVI Use as directed to check home blood pressure 2-3 times a week 1 Device 0    digoxin (LANOXIN) 0.125 MG tablet Take 1 tablet (0.125 mg total) by mouth daily. 30 tablet 2   ezetimibe (ZETIA) 10 MG tablet Take 1 tablet (10 mg total) by mouth daily. 30 tablet 2   glucose blood (TRUE METRIX BLOOD GLUCOSE TEST) test strip Use as instructed 100 each 12   insulin isophane & regular human KwikPen (NOVOLIN 70/30 KWIKPEN) (70-30) 100 UNIT/ML KwikPen 22 units SQ Q a.m and 18 units Q p.m (Patient taking differently: Inject 18-22 Units into the skin See admin instructions. 22 units in the mornig and 18 units in the evening) 13.2 mL 5   Insulin Pen Needle (TRUEPLUS 5-BEVEL PEN NEEDLES) 32G X 4 MM MISC Use in the morning, at noon, and at bedtime. 100 each 2   Insulin Syringe-Needle U-100 (RELION INSULIN SYRINGE) 31G X 15/64" 0.3  ML MISC Use to inject insulin daily. 100 each 11   ivabradine (CORLANOR) 7.5 MG TABS tablet Take 1 tablet (7.5 mg total) by mouth 2 (two) times daily with a meal. 60 tablet    levETIRAcetam (KEPPRA) 750 MG tablet Take 2 tablets (1,500 mg total) by mouth 2 (two) times daily. 120 tablet 2   Menthol, Topical Analgesic, (ICY HOT BACK EX) Apply 1 application topically as needed (neuropathy). Icy hot cream     metFORMIN (GLUCOPHAGE) 500 MG tablet Take 1 tablet (500 mg total) by mouth 2 (two) times daily with a meal. 60 tablet 4   metoprolol (TOPROL-XL) 200 MG 24 hr tablet Take 1 tablet (200 mg total) by mouth daily. Take with or immediately following a meal. 30 tablet 2   Oxcarbazepine (TRILEPTAL) 300 MG tablet Take 2 tablets (600 mg total) by mouth 2 (two) times daily. 120 tablet 2   phenytoin (DILANTIN) 100 MG ER capsule Take 1 capsule (100 mg total) by mouth 3 (three) times daily. 90 capsule 2   pregabalin (LYRICA) 100 MG capsule Take 1 capsule (100 mg total) by mouth 2 (two) times daily. 60 capsule 2   sacubitril-valsartan (ENTRESTO) 24-26 MG Take 1 tablet by mouth 2 (two) times daily. 60 tablet 0   spironolactone (ALDACTONE) 25 MG tablet Take 1 tablet (25 mg total) by  mouth daily. (Patient taking differently: Take 12.5 mg by mouth daily.) 30 tablet 5   spironolactone (ALDACTONE) 25 MG tablet Take 0.5 tablets (12.5 mg total) by mouth daily.     TRUEplus Lancets 28G MISC Use as directed 100 each 4   warfarin (COUMADIN) 5 MG tablet Take 1.5 tablets (7.5 mg total) by mouth daily. (Patient taking differently: Take 5-7.5 mg by mouth See admin instructions. 7.46m on Tuesday Thursday Saturday 517mon Monday, Wednesday, Friday, Sunday) 45 tablet 1   No current facility-administered medications for this encounter.   BP 98/69   Pulse 95   Wt 69.9 kg (154 lb)   SpO2 100%   BMI 21.48 kg/m   Wt Readings from Last 3 Encounters:  12/28/20 69.9 kg (154 lb)  12/25/20 70 kg (154 lb 6.4 oz)  12/20/20 70.4 kg (155 lb 2 oz)   PHYSICAL EXAM: General:  NAD. No resp difficulty, thin HEENT: Normal Neck: Supple. No JVD. Carotids 2+ bilat; no bruits. No lymphadenopathy or thryomegaly appreciated. Cor: PMI nondisplaced. Regular rate & rhythm. No rubs, gallops or murmurs. Lungs: Clear Abdomen: Soft, nontender, nondistended. No hepatosplenomegaly. No bruits or masses. Good bowel sounds. Extremities: No cyanosis, clubbing, rash, edema Neuro: Alert & oriented x 3, cranial nerves grossly intact. Moves all 4 extremities w/o difficulty. Affect pleasant.  ECG: ST with PVCs, LBBB (personally reviewed).  ASSESSMENT & PLAN:  Syncope: No further events. Will likely be able to tolerate only low-dose Entresto going further. - Plan on ICD when he has insurance 2. Chronic HFrHF: Nonischemic cardiomyopathy known for years.  Normal coronary CTA in 2014.  Echo (7/22) with EF 10-15%, LV thrombus, moderate RV dysfunction, dilated IVC. Recent CP worrisome for CAD.  R/LHC (6/22) showed normal filling pressures, preserved cardiac output and no significant CAD. NYHA II-early III, he is not volume overloaded on exam. - With poor appetite and recent syncope, decrease spironolactone to 12.5 mg daily.  BMET today. - Continue Entresto 24/26 mg bid (will not titrate further) - Continue Toprol XL 200 mg daily. - Continue Ivabradine 7.5 mg bid. HR 104, has not taken morning medications yet. - Continue digoxin  0.125. Check dig level today. - Continue lasix 20 mg PRN weight gain/edema. - Hold off on SGLT2i with recent volume depletion; Hgb A1c 8.3 - Continue with Paramedicine. - Discussed with Dr. Aundra Dubin, will arrange for CPX to quantify current HF limitation; may need advanced therapies soon. - EP planning on ICD when he has Medicaid. 3. Uncontrolled DM: 5/22 A1C > 15.5. Now down to 8.3 4. HLD: LDL 81, HDL 49. Continue atorvastatin + Zetia. 5. H/O 09/2020 Seizures - On keppra, dilantin 6. 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   - On coumadin and statin.  7. LV Thrombus: with associated CVA.  - On coumadin.   Follow up with Dr. Aundra Dubin as scheduled next month with echo.  Allena Katz, FNP-BC 12/29/20

## 2020-12-28 NOTE — Patient Instructions (Addendum)
EKG done today.  Labs done today. We will contact you only if your labs are abnormal.  DECREASE Spironolactone to 12.5mg  (1/2 tablet) by mouth daily.  No other medication changes were made. Please continue all current medications as prescribed.  Your physician has recommended that you have a cardiopulmonary stress test (CPX). CPX testing is a non-invasive measurement of heart and lung function. It replaces a traditional treadmill stress test. This type of test provides a tremendous amount of information that relates not only to your present condition but also for future outcomes. This test combines measurements of you ventilation, respiratory gas exchange in the lungs, electrocardiogram (EKG), blood pressure and physical response before, during, and following an exercise protocol. Please refer to handout provided to you during your visit today.   Your physician recommends that you keep your pending appointment.   If you have any questions or concerns before your next appointment please send [place] a message through [place] or call our office at [phone].    TO LEAVE A MESSAGE FOR THE NURSE SELECT OPTION 2, PLEASE LEAVE A MESSAGE INCLUDING: YOUR NAME DATE OF BIRTH CALL BACK NUMBER REASON FOR CALL**this is important as we prioritize the call backs  YOU WILL RECEIVE A CALL BACK THE SAME DAY AS LONG AS YOU CALL BEFORE 4:00 PM   Do the following things EVERYDAY: Weigh yourself in the morning before breakfast. Write it down and keep it in a log. Take your medicines as prescribed Eat low salt foods--Limit salt (sodium) to 2000 mg per day.  Stay as active as you can everyday Limit all fluids for the day to less than 2 liters   At the [facility], you and your health needs are our priority. As part of our continuing mission to provide you with exceptional heart care, we have created designated Provider Care Teams. These Care Teams include your primary Cardiologist (physician)  and Advanced Practice Providers (APPs- Physician Assistants and Nurse Practitioners) who all work together to provide you with the care you need, when you need it.   You may see any of the following providers on your designated Care Team at your next follow up: Dr Arvilla Meres Dr Carron Curie, NP Robbie Lis, [place] Karle Plumber, PharmD   Please be sure to bring in all your medications bottles to every appointment.

## 2020-12-29 ENCOUNTER — Other Ambulatory Visit (HOSPITAL_COMMUNITY): Payer: Self-pay

## 2020-12-29 ENCOUNTER — Encounter (HOSPITAL_COMMUNITY): Payer: Self-pay

## 2020-12-29 ENCOUNTER — Encounter (HOSPITAL_COMMUNITY): Payer: Medicaid Other

## 2020-12-29 DIAGNOSIS — I5022 Chronic systolic (congestive) heart failure: Secondary | ICD-10-CM

## 2020-12-29 NOTE — Progress Notes (Signed)
Orders Placed This Encounter  Procedures   Cardiopulmonary exercise test    Standing Status:   Future    Standing Expiration Date:   12/29/2021    Order Specific Question:   Where should this test be performed?    Answer:   [facility]

## 2021-01-02 ENCOUNTER — Other Ambulatory Visit: Payer: Self-pay

## 2021-01-02 ENCOUNTER — Ambulatory Visit (INDEPENDENT_AMBULATORY_CARE_PROVIDER_SITE_OTHER): Payer: Medicaid Other | Admitting: *Deleted

## 2021-01-02 ENCOUNTER — Ambulatory Visit (HOSPITAL_COMMUNITY): Payer: Medicaid Other | Attending: Family Medicine

## 2021-01-02 DIAGNOSIS — Z5181 Encounter for therapeutic drug level monitoring: Secondary | ICD-10-CM | POA: Diagnosis not present

## 2021-01-02 DIAGNOSIS — Z7901 Long term (current) use of anticoagulants: Secondary | ICD-10-CM | POA: Diagnosis not present

## 2021-01-02 DIAGNOSIS — I5022 Chronic systolic (congestive) heart failure: Secondary | ICD-10-CM | POA: Diagnosis not present

## 2021-01-02 DIAGNOSIS — I639 Cerebral infarction, unspecified: Secondary | ICD-10-CM | POA: Diagnosis not present

## 2021-01-02 DIAGNOSIS — I513 Intracardiac thrombosis, not elsewhere classified: Secondary | ICD-10-CM

## 2021-01-02 LAB — POCT INR: INR: 3.3 — AB (ref 2.0–3.0)

## 2021-01-02 NOTE — Patient Instructions (Signed)
Description   Today take 1/2 tablet then continue taking 1 tablet daily except for 1.5 tablets on Tuesdays, Thursdays, and Saturdays. Recheck in 2 weeks. [facility] [phone].

## 2021-01-04 ENCOUNTER — Telehealth (HOSPITAL_COMMUNITY): Payer: Self-pay | Admitting: Pharmacy Technician

## 2021-01-04 ENCOUNTER — Other Ambulatory Visit (HOSPITAL_COMMUNITY): Payer: Self-pay

## 2021-01-04 NOTE — Progress Notes (Signed)
Paramedicine Encounter    Patient ID: Jason Stevenson, male    DOB: 1985/05/19, 35 y.o.   MRN: [id number]   Patient Care Team: Ladell Pier, MD as PCP - General (Internal Medicine) Josue Hector, MD as PCP - Cardiology (Cardiology)  Patient Active Problem List   Diagnosis Date Noted   Ischemic cardiomyopathy    Syncope 12/14/2020   Chest pain 10/30/2020   History of cerebrovascular accident (CVA) with residual deficit 10/27/2020   Protein-calorie malnutrition, severe 09/22/2020   Acute CVA (cerebrovascular accident) ([place]) 09/21/2020   Near syncope 09/21/2020   Encounter for monitoring Coumadin therapy 09/20/2020   LV (left ventricular) mural thrombus 09/13/2020   Seizure ([place]) 09/13/2020   Neurological deficit present 09/12/2020   History of COVID-19 09/12/2020   Intracerebral hemorrhage 09/12/2020   Acute cerebrovascular accident (CVA) ([place]) 09/12/2020   Hyperosmolar hyperglycemic state (HHS) ([place]) 09/11/2020   COVID-19 08/23/2020   Noncompliance with medication treatment due to intermittent use of medication 10/12/2018   Dyslipidemia 04/17/2016   Uncontrolled type 2 diabetes mellitus without complication, with long-term current use of insulin 66/44/0347   Chronic systolic CHF (congestive heart failure) ([place]) 06/18/2015   Needs flu shot 12/23/2013   Type 2 diabetes mellitus ([place]) 11/18/2013   Diabetes ([name]) 06/17/2013   Non-ischemic cardiomyopathy ([place]) 11/06/2012   HTN (hypertension) 11/06/2012   HLD (hyperlipidemia) 11/06/2012    Current Outpatient Medications:    acetaminophen (TYLENOL) 500 MG tablet, Take 1,000 mg by mouth every 8 (eight) hours as needed for mild pain, fever or headache., Disp: , Rfl:    aspirin EC 81 MG tablet, Take 81 mg by mouth daily. Swallow whole., Disp: , Rfl:    atorvastatin (LIPITOR) 80 MG tablet, Take 1 tablet (80 mg total) by mouth daily., Disp: 30 tablet, Rfl: 2   Blood Glucose Monitoring Suppl (TRUE METRIX METER) w/Device KIT, 1  each by Does not apply route 3 (three) times daily., Disp: 1 kit, Rfl: 0   Blood Pressure Monitor DEVI, Use as directed to check home blood pressure 2-3 times a week, Disp: 1 Device, Rfl: 0   digoxin (LANOXIN) 0.125 MG tablet, Take 1 tablet (0.125 mg total) by mouth daily., Disp: 30 tablet, Rfl: 2   ezetimibe (ZETIA) 10 MG tablet, Take 1 tablet (10 mg total) by mouth daily., Disp: 30 tablet, Rfl: 2   glucose blood (TRUE METRIX BLOOD GLUCOSE TEST) test strip, Use as instructed, Disp: 100 each, Rfl: 12   insulin isophane & regular human KwikPen (NOVOLIN 70/30 KWIKPEN) (70-30) 100 UNIT/ML KwikPen, 22 units SQ Q a.m and 18 units Q p.m (Patient taking differently: Inject 18-22 Units into the skin See admin instructions. 22 units in the mornig and 18 units in the evening), Disp: 13.2 mL, Rfl: 5   Insulin Pen Needle (TRUEPLUS 5-BEVEL PEN NEEDLES) 32G X 4 MM MISC, Use in the morning, at noon, and at bedtime., Disp: 100 each, Rfl: 2   Insulin Syringe-Needle U-100 (RELION INSULIN SYRINGE) 31G X 15/64" 0.3 ML MISC, Use to inject insulin daily., Disp: 100 each, Rfl: 11   ivabradine (CORLANOR) 7.5 MG TABS tablet, Take 1 tablet (7.5 mg total) by mouth 2 (two) times daily with a meal., Disp: 60 tablet, Rfl:    levETIRAcetam (KEPPRA) 750 MG tablet, Take 2 tablets (1,500 mg total) by mouth 2 (two) times daily., Disp: 120 tablet, Rfl: 2   Menthol, Topical Analgesic, (ICY HOT BACK EX), Apply 1 application topically as needed (neuropathy). Icy hot cream, Disp: ,  Rfl:    metFORMIN (GLUCOPHAGE) 500 MG tablet, Take 1 tablet (500 mg total) by mouth 2 (two) times daily with a meal., Disp: 60 tablet, Rfl: 4   metoprolol (TOPROL-XL) 200 MG 24 hr tablet, Take 1 tablet (200 mg total) by mouth daily. Take with or immediately following a meal., Disp: 30 tablet, Rfl: 2   Oxcarbazepine (TRILEPTAL) 300 MG tablet, Take 2 tablets (600 mg total) by mouth 2 (two) times daily., Disp: 120 tablet, Rfl: 2   phenytoin (DILANTIN) 100 MG ER  capsule, Take 1 capsule (100 mg total) by mouth 3 (three) times daily., Disp: 90 capsule, Rfl: 2   pregabalin (LYRICA) 100 MG capsule, Take 1 capsule (100 mg total) by mouth 2 (two) times daily., Disp: 60 capsule, Rfl: 2   sacubitril-valsartan (ENTRESTO) 24-26 MG, Take 1 tablet by mouth 2 (two) times daily., Disp: 60 tablet, Rfl: 0   spironolactone (ALDACTONE) 25 MG tablet, Take 0.5 tablets (12.5 mg total) by mouth daily., Disp: 45 tablet, Rfl: 3   TRUEplus Lancets 28G MISC, Use as directed, Disp: 100 each, Rfl: 4   warfarin (COUMADIN) 5 MG tablet, Take 1.5 tablets (7.5 mg total) by mouth daily. (Patient taking differently: Take 5-7.5 mg by mouth See admin instructions. 7.21m on Tuesday Thursday Saturday 540mon Monday, Wednesday, Friday, Sunday), Disp: 45 tablet, Rfl: 1 No Known Allergies   Social History   Socioeconomic History   Marital status: Married    Spouse name: Not on file   Number of children: Not on file   Years of education: Not on file   Highest education level: Not on file  Occupational History   Occupation: unemployed  Tobacco Use   Smoking status: Never   Smokeless tobacco: Never  Vaping Use   Vaping Use: Not on file  Substance and Sexual Activity   Alcohol use: Not Currently    Comment: "occasional" when "hanging out with the wrong people" No recent use.   Drug use: Not Currently    Types: Marijuana    Comment: occasional, last 2013   Sexual activity: Not Currently  Other Topics Concern   Not on file  Social History Narrative   Pt lives at home with his wife    Right handed    Caffeine- hardly any   Social Determinants of Health   Financial Resource Strain: High Risk   Difficulty of Paying Living Expenses: Hard  Food Insecurity: No Food Insecurity   Worried About RuCharity fundraisern the Last Year: Never true   Ran Out of Food in the Last Year: Never true  Transportation Needs: Unmet Transportation Needs   Lack of Transportation (Medical): No   Lack  of Transportation (Non-Medical): Yes  Physical Activity: Not on file  Stress: Not on file  Social Connections: Not on file  Intimate Partner Violence: Not on file    Physical Exam Vitals reviewed.  Constitutional:      Appearance: Normal appearance. He is normal weight.  HENT:     Head: Normocephalic.     Nose: Nose normal.     Mouth/Throat:     Mouth: Mucous membranes are moist.     Pharynx: Oropharynx is clear.  Eyes:     Conjunctiva/sclera: Conjunctivae normal.     Pupils: Pupils are equal, round, and reactive to light.  Cardiovascular:     Rate and Rhythm: Normal rate and regular rhythm.     Pulses: Normal pulses.     Heart sounds: Normal heart sounds.  Pulmonary:     Effort: Pulmonary effort is normal.     Breath sounds: Normal breath sounds.  Abdominal:     General: Abdomen is flat.     Palpations: Abdomen is soft.  Musculoskeletal:        General: No swelling. Normal range of motion.     Right lower leg: No edema.     Left lower leg: No edema.  Skin:    General: Skin is warm and dry.     Capillary Refill: Capillary refill takes less than 2 seconds.  Neurological:     General: No focal deficit present.     Mental Status: He is alert. Mental status is at baseline.  Psychiatric:        Mood and Affect: Mood normal.    Arrived for home visit for Kayson who reports he is feeling okay but states he is tired with continuing neuropathy pain but states he feels this has gotten slightly better with Pregabalin increase. I recommended some exercises and ICE method at home. He says this pain in his right arm started after his right heart cath.      Vitals and assessment as noted in report.   [name] and I reviewed medications and pill box was filled accordingly.   -We are awaiting Corlanor from [facility].  Entresto: Needs samples, I will fill out Novart[employer] application for him.   Appointments reviewed. I will see Jamai in one week in the home.   CBG- 146       Future Appointments  Date Time Provider [place]  01/16/2021  9:30 AM [facility]  01/29/2021  8:00 AM [facility] ECHO OP 1 [facility]  01/29/2021  9:00 AM Larey Dresser, MD [facility] None  01/30/2021  9:40 AM Lelon Perla, MD [facility]  02/05/2021  9:30 AM Ladell Pier, MD [facility] None  02/07/2021  1:15 PM Vickie Epley, MD [facility]  06/26/2021  9:30 AM Penumalli, Earlean Polka, MD [facility] None     ACTION: Home visit completed

## 2021-01-04 NOTE — Telephone Encounter (Signed)
[facility] Patient Advocate Encounter  I received a message from [facility] (EMT) that the patient has yet to receive a shipment of Corlanor from [facility] despite them calling and placing a refill order a few weeks ago.  Called [facility], the representative stated that when the [place] was ordered on 08/31, there was no request for Corlanor to be sent. I requested the refill while on the phone. Expected delivery date, Tuesday 09/27.  Relayed the information above to [employer]. Advised her to remind the patient to request both medications when calling for a refill.   Archer Asa, CPhT

## 2021-01-11 ENCOUNTER — Other Ambulatory Visit (HOSPITAL_COMMUNITY): Payer: Self-pay | Admitting: Family Medicine

## 2021-01-11 ENCOUNTER — Telehealth (HOSPITAL_COMMUNITY): Payer: Self-pay | Admitting: *Deleted

## 2021-01-11 ENCOUNTER — Other Ambulatory Visit: Payer: Self-pay | Admitting: Internal Medicine

## 2021-01-11 ENCOUNTER — Other Ambulatory Visit (HOSPITAL_COMMUNITY): Payer: Self-pay

## 2021-01-11 NOTE — Telephone Encounter (Signed)
Herbert Seta, community paramedic request Entreto samples for pt, sample provided  Medication Samples have been provided to the patient.  Drug name: Sherryll Burger       Strength: 24/26mg         Qty: 2  LOT: [id number]  Exp.Date: 2/25  Dosing instructions: Take 1 tab Twice daily   The patient has been instructed regarding the correct time, dose, and frequency of taking this medication, including desired effects and most common side effects.   Mieka Leaton 11:26 AM 01/11/2021

## 2021-01-11 NOTE — Progress Notes (Signed)
Paramedicine Encounter    Patient ID: Jason Stevenson, male    DOB: 1985/05/19, 35 y.o.   MRN: [id number]   Patient Care Team: Ladell Pier, MD as PCP - General (Internal Medicine) Josue Hector, MD as PCP - Cardiology (Cardiology)  Patient Active Problem List   Diagnosis Date Noted   Ischemic cardiomyopathy    Syncope 12/14/2020   Chest pain 10/30/2020   History of cerebrovascular accident (CVA) with residual deficit 10/27/2020   Protein-calorie malnutrition, severe 09/22/2020   Acute CVA (cerebrovascular accident) ([place]) 09/21/2020   Near syncope 09/21/2020   Encounter for monitoring Coumadin therapy 09/20/2020   LV (left ventricular) mural thrombus 09/13/2020   Seizure ([place]) 09/13/2020   Neurological deficit present 09/12/2020   History of COVID-19 09/12/2020   Intracerebral hemorrhage 09/12/2020   Acute cerebrovascular accident (CVA) ([place]) 09/12/2020   Hyperosmolar hyperglycemic state (HHS) ([place]) 09/11/2020   COVID-19 08/23/2020   Noncompliance with medication treatment due to intermittent use of medication 10/12/2018   Dyslipidemia 04/17/2016   Uncontrolled type 2 diabetes mellitus without complication, with long-term current use of insulin 66/44/0347   Chronic systolic CHF (congestive heart failure) ([place]) 06/18/2015   Needs flu shot 12/23/2013   Type 2 diabetes mellitus ([place]) 11/18/2013   Diabetes ([name]) 06/17/2013   Non-ischemic cardiomyopathy ([place]) 11/06/2012   HTN (hypertension) 11/06/2012   HLD (hyperlipidemia) 11/06/2012    Current Outpatient Medications:    acetaminophen (TYLENOL) 500 MG tablet, Take 1,000 mg by mouth every 8 (eight) hours as needed for mild pain, fever or headache., Disp: , Rfl:    aspirin EC 81 MG tablet, Take 81 mg by mouth daily. Swallow whole., Disp: , Rfl:    atorvastatin (LIPITOR) 80 MG tablet, Take 1 tablet (80 mg total) by mouth daily., Disp: 30 tablet, Rfl: 2   Blood Glucose Monitoring Suppl (TRUE METRIX METER) w/Device KIT, 1  each by Does not apply route 3 (three) times daily., Disp: 1 kit, Rfl: 0   Blood Pressure Monitor DEVI, Use as directed to check home blood pressure 2-3 times a week, Disp: 1 Device, Rfl: 0   digoxin (LANOXIN) 0.125 MG tablet, Take 1 tablet (0.125 mg total) by mouth daily., Disp: 30 tablet, Rfl: 2   ezetimibe (ZETIA) 10 MG tablet, Take 1 tablet (10 mg total) by mouth daily., Disp: 30 tablet, Rfl: 2   glucose blood (TRUE METRIX BLOOD GLUCOSE TEST) test strip, Use as instructed, Disp: 100 each, Rfl: 12   insulin isophane & regular human KwikPen (NOVOLIN 70/30 KWIKPEN) (70-30) 100 UNIT/ML KwikPen, 22 units SQ Q a.m and 18 units Q p.m (Patient taking differently: Inject 18-22 Units into the skin See admin instructions. 22 units in the mornig and 18 units in the evening), Disp: 13.2 mL, Rfl: 5   Insulin Pen Needle (TRUEPLUS 5-BEVEL PEN NEEDLES) 32G X 4 MM MISC, Use in the morning, at noon, and at bedtime., Disp: 100 each, Rfl: 2   Insulin Syringe-Needle U-100 (RELION INSULIN SYRINGE) 31G X 15/64" 0.3 ML MISC, Use to inject insulin daily., Disp: 100 each, Rfl: 11   ivabradine (CORLANOR) 7.5 MG TABS tablet, Take 1 tablet (7.5 mg total) by mouth 2 (two) times daily with a meal., Disp: 60 tablet, Rfl:    levETIRAcetam (KEPPRA) 750 MG tablet, Take 2 tablets (1,500 mg total) by mouth 2 (two) times daily., Disp: 120 tablet, Rfl: 2   Menthol, Topical Analgesic, (ICY HOT BACK EX), Apply 1 application topically as needed (neuropathy). Icy hot cream, Disp: ,  Rfl:    metFORMIN (GLUCOPHAGE) 500 MG tablet, Take 1 tablet (500 mg total) by mouth 2 (two) times daily with a meal., Disp: 60 tablet, Rfl: 4   metoprolol (TOPROL-XL) 200 MG 24 hr tablet, Take 1 tablet (200 mg total) by mouth daily. Take with or immediately following a meal., Disp: 30 tablet, Rfl: 2   Oxcarbazepine (TRILEPTAL) 300 MG tablet, Take 2 tablets (600 mg total) by mouth 2 (two) times daily., Disp: 120 tablet, Rfl: 2   phenytoin (DILANTIN) 100 MG ER  capsule, Take 1 capsule (100 mg total) by mouth 3 (three) times daily., Disp: 90 capsule, Rfl: 2   pregabalin (LYRICA) 100 MG capsule, Take 1 capsule (100 mg total) by mouth 2 (two) times daily., Disp: 60 capsule, Rfl: 2   sacubitril-valsartan (ENTRESTO) 24-26 MG, Take 1 tablet by mouth 2 (two) times daily., Disp: 60 tablet, Rfl: 0   spironolactone (ALDACTONE) 25 MG tablet, Take 0.5 tablets (12.5 mg total) by mouth daily., Disp: 45 tablet, Rfl: 3   TRUEplus Lancets 28G MISC, Use as directed, Disp: 100 each, Rfl: 4   warfarin (COUMADIN) 5 MG tablet, Take 1.5 tablets (7.5 mg total) by mouth daily. (Patient taking differently: Take 5-7.5 mg by mouth See admin instructions. 7.21m on Tuesday Thursday Saturday 540mon Monday, Wednesday, Friday, Sunday), Disp: 45 tablet, Rfl: 1 No Known Allergies   Social History   Socioeconomic History   Marital status: Married    Spouse name: Not on file   Number of children: Not on file   Years of education: Not on file   Highest education level: Not on file  Occupational History   Occupation: unemployed  Tobacco Use   Smoking status: Never   Smokeless tobacco: Never  Vaping Use   Vaping Use: Not on file  Substance and Sexual Activity   Alcohol use: Not Currently    Comment: "occasional" when "hanging out with the wrong people" No recent use.   Drug use: Not Currently    Types: Marijuana    Comment: occasional, last 2013   Sexual activity: Not Currently  Other Topics Concern   Not on file  Social History Narrative   Pt lives at home with his wife    Right handed    Caffeine- hardly any   Social Determinants of Health   Financial Resource Strain: High Risk   Difficulty of Paying Living Expenses: Hard  Food Insecurity: No Food Insecurity   Worried About RuCharity fundraisern the Last Year: Never true   Ran Out of Food in the Last Year: Never true  Transportation Needs: Unmet Transportation Needs   Lack of Transportation (Medical): No   Lack  of Transportation (Non-Medical): Yes  Physical Activity: Not on file  Stress: Not on file  Social Connections: Not on file  Intimate Partner Violence: Not on file    Physical Exam Vitals reviewed.  Constitutional:      Appearance: Normal appearance. He is normal weight.  HENT:     Head: Normocephalic.     Nose: Nose normal.     Mouth/Throat:     Mouth: Mucous membranes are moist.     Pharynx: Oropharynx is clear.  Eyes:     Conjunctiva/sclera: Conjunctivae normal.     Pupils: Pupils are equal, round, and reactive to light.  Cardiovascular:     Rate and Rhythm: Normal rate and regular rhythm.     Pulses: Normal pulses.     Heart sounds: Normal heart sounds.  Pulmonary:     Effort: Pulmonary effort is normal.     Breath sounds: Normal breath sounds.  Abdominal:     General: Abdomen is flat.     Palpations: Abdomen is soft.  Musculoskeletal:        General: No swelling. Normal range of motion.     Cervical back: Normal range of motion.     Right lower leg: No edema.     Left lower leg: No edema.  Skin:    General: Skin is warm and dry.     Capillary Refill: Capillary refill takes less than 2 seconds.  Neurological:     General: No focal deficit present.     Mental Status: He is alert. Mental status is at baseline.  Psychiatric:        Mood and Affect: Mood normal.    Arrived for home visit for Ennis who was alert and oriented reporting to be feeling okay but states he has had a few episodes of dizziness this week. He states today his sugar was 75. He said he ate and he was feeling better. I encouraged him to be sure to eat at least three meals a day, he verbalized understanding.   Kmari is checking his vitals daily and SBP has been trending <120 daily.   I advised him to be sure to contact HF clinic if he continues to have persistent low BP's and dizziness during my absence. He agreed with plan.   Michel denied any shortness of breath, trouble sleeping or completing  daily tasks or taking his medications. He has been med compliant. Tabius reports he still is having right hand and wrist pain. He continues to use OTC therapies.   We have not received Corlanor from [facility] yet. I contacted them and they reported they have not made contact with Mr. Maloof. I was able to make a three way call to confirm shipment of same. It will be delivered next week.   I reviewed medications and verified same and filled one pill box for [name].  I have labeled all bottles and Tevin understands how to fill pill box while I am out. He knows to reach out to [facility] or clinic if he has any concerns or questions.   Refills: [facility]- Metoprolol Atorvastatin Zetia Digoxin  [place]- Trileptal Keppra Dilantin     Future Appointments  Date Time Provider [place]  01/16/2021  9:30 AM [facility]  01/29/2021  8:00 AM MC ECHO OP 1 [facility]  01/29/2021  9:00 AM Larey Dresser, MD [facility] None  01/30/2021  9:40 AM Lelon Perla, MD [facility]  02/05/2021  9:30 AM Ladell Pier, MD [facility] None  02/07/2021  1:15 PM Vickie Epley, MD [facility]  06/26/2021  9:30 AM Penumalli, Earlean Polka, MD [facility] None     ACTION: Home visit completed

## 2021-01-11 NOTE — Telephone Encounter (Signed)
Requested medications are due for refill today yes, within a week  Requested medications are on the active medication list yes  Last refill 9/6  Last visit 10/27/20  Future visit scheduled 02/05/21  Notes to clinic These  three meds are not delegated, please assess.

## 2021-01-12 ENCOUNTER — Telehealth: Payer: Self-pay | Admitting: Internal Medicine

## 2021-01-12 ENCOUNTER — Other Ambulatory Visit (HOSPITAL_COMMUNITY): Payer: Self-pay

## 2021-01-12 ENCOUNTER — Other Ambulatory Visit: Payer: Self-pay

## 2021-01-12 MED ORDER — METOPROLOL SUCCINATE ER 200 MG PO TB24
200.0000 mg | ORAL_TABLET | Freq: Every day | ORAL | 2 refills | Status: DC
  Filled 2021-01-12: qty 30, 30d supply, fill #0
  Filled 2021-02-08: qty 30, 30d supply, fill #1

## 2021-01-12 MED ORDER — OXCARBAZEPINE 300 MG PO TABS
600.0000 mg | ORAL_TABLET | Freq: Two times a day (BID) | ORAL | 3 refills | Status: DC
  Filled 2021-01-12 (×2): qty 120, 30d supply, fill #0
  Filled 2021-02-08 – 2021-02-15 (×2): qty 120, 30d supply, fill #1
  Filled 2021-03-15: qty 120, 30d supply, fill #2

## 2021-01-12 MED ORDER — PHENYTOIN SODIUM EXTENDED 100 MG PO CAPS
100.0000 mg | ORAL_CAPSULE | Freq: Three times a day (TID) | ORAL | 0 refills | Status: DC
  Filled 2021-01-12: qty 90, 30d supply, fill #0

## 2021-01-12 MED ORDER — LEVETIRACETAM 750 MG PO TABS
1500.0000 mg | ORAL_TABLET | Freq: Two times a day (BID) | ORAL | 0 refills | Status: DC
  Filled 2021-01-12: qty 120, 30d supply, fill #0

## 2021-01-12 MED ORDER — EZETIMIBE 10 MG PO TABS
10.0000 mg | ORAL_TABLET | Freq: Every day | ORAL | 2 refills | Status: DC
  Filled 2021-01-12: qty 30, 30d supply, fill #0
  Filled 2021-02-15 – 2021-03-02 (×2): qty 30, 30d supply, fill #1
  Filled 2021-03-22 – 2021-03-29 (×2): qty 30, 30d supply, fill #2

## 2021-01-12 MED ORDER — OXCARBAZEPINE 300 MG PO TABS
600.0000 mg | ORAL_TABLET | Freq: Two times a day (BID) | ORAL | 0 refills | Status: DC
  Filled 2021-01-12: qty 120, 30d supply, fill #0

## 2021-01-12 MED ORDER — PHENYTOIN SODIUM EXTENDED 100 MG PO CAPS
100.0000 mg | ORAL_CAPSULE | Freq: Three times a day (TID) | ORAL | 5 refills | Status: DC
  Filled 2021-01-12 (×2): qty 90, 30d supply, fill #0
  Filled 2021-02-08 – 2021-02-15 (×2): qty 90, 30d supply, fill #1
  Filled 2021-03-15: qty 90, 30d supply, fill #2

## 2021-01-12 MED ORDER — LEVETIRACETAM 750 MG PO TABS
1500.0000 mg | ORAL_TABLET | Freq: Two times a day (BID) | ORAL | 3 refills | Status: DC
  Filled 2021-01-12 (×2): qty 120, 30d supply, fill #0
  Filled 2021-02-15: qty 120, 30d supply, fill #1
  Filled 2021-03-02 – 2021-03-15 (×2): qty 120, 30d supply, fill #2

## 2021-01-12 MED ORDER — ATORVASTATIN CALCIUM 80 MG PO TABS
80.0000 mg | ORAL_TABLET | Freq: Every day | ORAL | 2 refills | Status: DC
  Filled 2021-01-12: qty 30, 30d supply, fill #0
  Filled 2021-02-15 – 2021-03-02 (×2): qty 30, 30d supply, fill #1
  Filled 2021-03-22 – 2021-03-29 (×2): qty 30, 30d supply, fill #2

## 2021-01-12 MED ORDER — DIGOXIN 125 MCG PO TABS
0.1250 mg | ORAL_TABLET | Freq: Every day | ORAL | 2 refills | Status: DC
  Filled 2021-01-12: qty 30, 30d supply, fill #0
  Filled 2021-02-08: qty 30, 30d supply, fill #1
  Filled 2021-03-15: qty 30, 30d supply, fill #2

## 2021-01-12 NOTE — Telephone Encounter (Signed)
-----   Message from Geraldine Solar, EMT sent at 01/11/2021  5:53 PM EDT ----- Regarding: refills Hey, Jason Stevenson is needing the following refills sent to [facility]: -Dilantin -Trilleptal -Keppra   Thanks!

## 2021-01-15 ENCOUNTER — Telehealth (HOSPITAL_COMMUNITY): Payer: Self-pay | Admitting: Licensed Clinical Social Worker

## 2021-01-15 NOTE — Telephone Encounter (Signed)
[employer] application for Entresto assistance faxed in for review- fax confirmation received- awaiting determination  Satara Virella H. Amerah Puleo, LCSW Clinical Social Worker [facility] Desk#: [phone] Cell#: [phone]  

## 2021-01-16 ENCOUNTER — Other Ambulatory Visit: Payer: Self-pay

## 2021-01-16 ENCOUNTER — Ambulatory Visit (INDEPENDENT_AMBULATORY_CARE_PROVIDER_SITE_OTHER): Payer: Medicaid Other | Admitting: *Deleted

## 2021-01-16 ENCOUNTER — Other Ambulatory Visit (HOSPITAL_COMMUNITY): Payer: Self-pay

## 2021-01-16 DIAGNOSIS — Z5181 Encounter for therapeutic drug level monitoring: Secondary | ICD-10-CM

## 2021-01-16 DIAGNOSIS — Z7901 Long term (current) use of anticoagulants: Secondary | ICD-10-CM

## 2021-01-16 DIAGNOSIS — I639 Cerebral infarction, unspecified: Secondary | ICD-10-CM

## 2021-01-16 DIAGNOSIS — I513 Intracardiac thrombosis, not elsewhere classified: Secondary | ICD-10-CM | POA: Diagnosis not present

## 2021-01-16 LAB — POCT INR: INR: 3.9 — AB (ref 2.0–3.0)

## 2021-01-16 NOTE — Patient Instructions (Addendum)
Description   Do not take any Warfarin today then start taking 1 tablet daily except for 1.5 tablets on Tuesdays and Saturdays. Keep 2 leafy veggies in your diet weekly. Recheck in 2 weeks. Coumadin Clinic [phone].

## 2021-01-24 NOTE — Progress Notes (Deleted)
Jason Barthel, MD Reason for referral-nonischemic cardiomyopathy/congestive heart failure  HPI: 35 year old male for evaluation of nonischemic cardiomyopathy/congestive heart failure at request of Karle Plumber, MD.  Echocardiogram June 2022 showed ejection fraction 10 to 15%, apical thrombus, mild left ventricular enlargement, moderate RV dysfunction, mild left atrial enlargement, mild mitral regurgitation.  Cardiac catheterization July 2022 showed no coronary disease.  Filling pressures were normal.  CPX September 2022 suggested cardiopulmonary limitation in addition to deconditioning.  Since last seen  Current Outpatient Medications  Medication Sig Dispense Refill   acetaminophen (TYLENOL) 500 MG tablet Take 1,000 mg by mouth every 8 (eight) hours as needed for mild pain, fever or headache.     aspirin EC 81 MG tablet Take 81 mg by mouth daily. Swallow whole.     atorvastatin (LIPITOR) 80 MG tablet Take 1 tablet (80 mg total) by mouth daily. 30 tablet 2   Blood Glucose Monitoring Suppl (TRUE METRIX METER) w/Device KIT 1 each by Does not apply route 3 (three) times daily. 1 kit 0   Blood Pressure Monitor DEVI Use as directed to check home blood pressure 2-3 times a week 1 Device 0   digoxin (LANOXIN) 0.125 MG tablet Take 1 tablet (0.125 mg total) by mouth daily. 30 tablet 2   ezetimibe (ZETIA) 10 MG tablet Take 1 tablet (10 mg total) by mouth daily. 30 tablet 2   glucose blood (TRUE METRIX BLOOD GLUCOSE TEST) test strip Use as instructed 100 each 12   insulin isophane & regular human KwikPen (NOVOLIN 70/30 KWIKPEN) (70-30) 100 UNIT/ML KwikPen 22 units SQ Q a.m and 18 units Q p.m (Patient taking differently: Inject 18-22 Units into the skin See admin instructions. 22 units in the mornig and 18 units in the evening) 13.2 mL 5   Insulin Pen Needle (TRUEPLUS 5-BEVEL PEN NEEDLES) 32G X 4 MM MISC Use in the morning, at noon, and at bedtime. 100 each 2   Insulin Syringe-Needle U-100  (RELION INSULIN SYRINGE) 31G X 15/64" 0.3 ML MISC Use to inject insulin daily. 100 each 11   ivabradine (CORLANOR) 7.5 MG TABS tablet Take 1 tablet (7.5 mg total) by mouth 2 (two) times daily with a meal. 60 tablet    levETIRAcetam (KEPPRA) 750 MG tablet Take 2 tablets (1,500 mg total) by mouth 2 (two) times daily. 120 tablet 3   Menthol, Topical Analgesic, (ICY HOT BACK EX) Apply 1 application topically as needed (neuropathy). Icy hot cream     metFORMIN (GLUCOPHAGE) 500 MG tablet Take 1 tablet (500 mg total) by mouth 2 (two) times daily with a meal. 60 tablet 4   metoprolol (TOPROL-XL) 200 MG 24 hr tablet Take 1 tablet (200 mg total) by mouth daily. Take with or immediately following a meal. 30 tablet 2   Oxcarbazepine (TRILEPTAL) 300 MG tablet Take 2 tablets (600 mg total) by mouth 2 (two) times daily. 120 tablet 3   phenytoin (DILANTIN) 100 MG ER capsule Take 1 capsule (100 mg total) by mouth 3 (three) times daily. 90 capsule 5   pregabalin (LYRICA) 100 MG capsule Take 1 capsule (100 mg total) by mouth 2 (two) times daily. 60 capsule 2   sacubitril-valsartan (ENTRESTO) 24-26 MG Take 1 tablet by mouth 2 (two) times daily. 60 tablet 0   spironolactone (ALDACTONE) 25 MG tablet Take 0.5 tablets (12.5 mg total) by mouth daily. 45 tablet 3   TRUEplus Lancets 28G MISC Use as directed 100 each 4   warfarin (COUMADIN) 5 MG tablet  Take 1.5 tablets (7.5 mg total) by mouth daily. (Patient taking differently: Take 5-7.5 mg by mouth See admin instructions. 7.65m on Tuesday Thursday Saturday 568mon Monday, Wednesday, Friday, Sunday) 45 tablet 1   No current facility-administered medications for this visit.    No Known Allergies   Past Medical History:  Diagnosis Date   Chronic systolic CHF (congestive heart failure) ([facility]/08/2015   Diabetes mellitus without complication ([facility]   H[place]agic stroke ([facility]   Hyperlipidemia    Hypertension    Nonischemic cardiomyopathy ([facility] as early as 2007    Per chart review (cards consult note 2011), EF of 40% in 2007, down to 20-25% in 2011    Past Surgical History:  Procedure Laterality Date   None     RIGHT/LEFT HEART CATH AND CORONARY ANGIOGRAPHY N/A 11/02/2020   Procedure: RIGHT/LEFT HEART CATH AND CORONARY ANGIOGRAPHY;  Surgeon: McLarey DresserMD;  [facility];  Service: Cardiovascular;  Laterality: N/A;    Social History   Socioeconomic History   Marital status: Married    Spouse name: Not on file   Number of children: Not on file   Years of education: Not on file   Highest education level: Not on file  Occupational History   Occupation: unemployed  Tobacco Use   Smoking status: Never   Smokeless tobacco: Never  Vaping Use   Vaping Use: Not on file  Substance and Sexual Activity   Alcohol use: Not Currently    Comment: "occasional" when "hanging out with the wrong people" No recent use.   Drug use: Not Currently    Types: Marijuana    Comment: occasional, last 2013   Sexual activity: Not Currently  Other Topics Concern   Not on file  Social History Narrative   Pt lives at home with his wife    Right handed    Caffeine- hardly any   Social Determinants of Health   Financial Resource Strain: High Risk   Difficulty of Paying Living Expenses: Hard  Food Insecurity: No Food Insecurity   Worried About RuCharity fundraisern the Last Year: Never true   RaArboriculturistn the Last Year: Never true  Transportation Needs: Unmet Transportation Needs   Lack of Transportation (Medical): No   Lack of Transportation (Non-Medical): Yes  Physical Activity: Not on file  Stress: Not on file  Social Connections: Not on file  Intimate Partner Violence: Not on file    Family History  Problem Relation Age of Onset   Stroke Mother    Diabetes Mother    Hypertension Mother    Stroke Maternal Aunt    Heart attack Neg Hx     ROS: no fevers or chills, productive cough, hemoptysis, dysphasia, odynophagia,  melena, hematochezia, dysuria, hematuria, rash, seizure activity, orthopnea, PND, pedal edema, claudication. Remaining systems are negative.  Physical Exam:   There were no vitals taken for this visit.  General:  Well developed/well nourished in NAD Skin warm/dry Patient not depressed No peripheral clubbing Back-normal HEENT-normal/normal eyelids Neck supple/normal carotid upstroke bilaterally; no bruits; no JVD; no thyromegaly chest - CTA/ normal expansion CV - RRR/normal S1 and S2; no murmurs, rubs or gallops;  PMI nondisplaced Abdomen -NT/ND, no HSM, no mass, + bowel sounds, no bruit 2+ femoral pulses, no bruits Ext-no edema, chords, 2+ DP Neuro-grossly nonfocal  ECG - personally reviewed  A/P  1  BrKirk RuthsMD

## 2021-01-25 ENCOUNTER — Encounter: Payer: Self-pay | Admitting: Internal Medicine

## 2021-01-27 ENCOUNTER — Other Ambulatory Visit: Payer: Self-pay | Admitting: Internal Medicine

## 2021-01-27 DIAGNOSIS — E1140 Type 2 diabetes mellitus with diabetic neuropathy, unspecified: Secondary | ICD-10-CM

## 2021-01-27 DIAGNOSIS — Z794 Long term (current) use of insulin: Secondary | ICD-10-CM

## 2021-01-27 DIAGNOSIS — E1142 Type 2 diabetes mellitus with diabetic polyneuropathy: Secondary | ICD-10-CM

## 2021-01-27 MED ORDER — PREGABALIN 100 MG PO CAPS
100.0000 mg | ORAL_CAPSULE | Freq: Three times a day (TID) | ORAL | 2 refills | Status: DC
Start: 2021-01-27 — End: 2021-04-11
  Filled 2021-01-27: qty 90, 30d supply, fill #0
  Filled 2021-03-15: qty 90, 30d supply, fill #1

## 2021-01-29 ENCOUNTER — Encounter (HOSPITAL_COMMUNITY): Payer: Self-pay | Admitting: Cardiology

## 2021-01-29 ENCOUNTER — Ambulatory Visit: Payer: Medicaid Other | Admitting: Cardiology

## 2021-01-29 ENCOUNTER — Ambulatory Visit (HOSPITAL_BASED_OUTPATIENT_CLINIC_OR_DEPARTMENT_OTHER)
Admission: RE | Admit: 2021-01-29 | Discharge: 2021-01-29 | Disposition: A | Discharge status: Home / Self Care | Payer: Medicaid Other | Source: Ambulatory Visit | Attending: Cardiology | Admitting: Cardiology

## 2021-01-29 ENCOUNTER — Other Ambulatory Visit: Payer: Self-pay

## 2021-01-29 ENCOUNTER — Ambulatory Visit (HOSPITAL_COMMUNITY)
Admission: RE | Admit: 2021-01-29 | Discharge: 2021-01-29 | Disposition: A | Discharge status: Home / Self Care | Payer: Medicaid Other | Source: Ambulatory Visit | Attending: Cardiology | Admitting: Cardiology

## 2021-01-29 ENCOUNTER — Other Ambulatory Visit (HOSPITAL_COMMUNITY): Payer: Self-pay

## 2021-01-29 VITALS — BP 90/58 | HR 98 | Wt 149.4 lb

## 2021-01-29 DIAGNOSIS — Z09 Encounter for follow-up examination after completed treatment for conditions other than malignant neoplasm: Secondary | ICD-10-CM | POA: Diagnosis not present

## 2021-01-29 DIAGNOSIS — Z560 Unemployment, unspecified: Secondary | ICD-10-CM | POA: Diagnosis not present

## 2021-01-29 DIAGNOSIS — R0602 Shortness of breath: Secondary | ICD-10-CM | POA: Diagnosis not present

## 2021-01-29 DIAGNOSIS — Z7984 Long term (current) use of oral hypoglycemic drugs: Secondary | ICD-10-CM | POA: Diagnosis not present

## 2021-01-29 DIAGNOSIS — E1165 Type 2 diabetes mellitus with hyperglycemia: Secondary | ICD-10-CM

## 2021-01-29 DIAGNOSIS — Z794 Long term (current) use of insulin: Secondary | ICD-10-CM | POA: Insufficient documentation

## 2021-01-29 DIAGNOSIS — Z8673 Personal history of transient ischemic attack (TIA), and cerebral infarction without residual deficits: Secondary | ICD-10-CM | POA: Insufficient documentation

## 2021-01-29 DIAGNOSIS — R42 Dizziness and giddiness: Secondary | ICD-10-CM | POA: Insufficient documentation

## 2021-01-29 DIAGNOSIS — I5022 Chronic systolic (congestive) heart failure: Secondary | ICD-10-CM | POA: Diagnosis not present

## 2021-01-29 DIAGNOSIS — E1042 Type 1 diabetes mellitus with diabetic polyneuropathy: Secondary | ICD-10-CM | POA: Diagnosis not present

## 2021-01-29 DIAGNOSIS — Z86718 Personal history of other venous thrombosis and embolism: Secondary | ICD-10-CM | POA: Diagnosis not present

## 2021-01-29 DIAGNOSIS — G40909 Epilepsy, unspecified, not intractable, without status epilepticus: Secondary | ICD-10-CM | POA: Insufficient documentation

## 2021-01-29 DIAGNOSIS — I5030 Unspecified diastolic (congestive) heart failure: Secondary | ICD-10-CM

## 2021-01-29 DIAGNOSIS — Z8249 Family history of ischemic heart disease and other diseases of the circulatory system: Secondary | ICD-10-CM | POA: Insufficient documentation

## 2021-01-29 DIAGNOSIS — I5042 Chronic combined systolic (congestive) and diastolic (congestive) heart failure: Secondary | ICD-10-CM | POA: Insufficient documentation

## 2021-01-29 DIAGNOSIS — R55 Syncope and collapse: Secondary | ICD-10-CM | POA: Insufficient documentation

## 2021-01-29 DIAGNOSIS — I110 Hypertensive heart disease with heart failure: Secondary | ICD-10-CM | POA: Insufficient documentation

## 2021-01-29 DIAGNOSIS — E785 Hyperlipidemia, unspecified: Secondary | ICD-10-CM | POA: Diagnosis not present

## 2021-01-29 DIAGNOSIS — I428 Other cardiomyopathies: Secondary | ICD-10-CM | POA: Insufficient documentation

## 2021-01-29 DIAGNOSIS — Z95810 Presence of automatic (implantable) cardiac defibrillator: Secondary | ICD-10-CM | POA: Diagnosis not present

## 2021-01-29 DIAGNOSIS — Z7901 Long term (current) use of anticoagulants: Secondary | ICD-10-CM | POA: Insufficient documentation

## 2021-01-29 DIAGNOSIS — Z79899 Other long term (current) drug therapy: Secondary | ICD-10-CM | POA: Diagnosis not present

## 2021-01-29 LAB — HEMOGLOBIN A1C
Hgb A1c MFr Bld: 8.2 % — ABNORMAL HIGH (ref 4.8–5.6)
Mean Plasma Glucose: 188.64 mg/dL

## 2021-01-29 LAB — ECHOCARDIOGRAM COMPLETE
Area-P 1/2: 7.4 cm2
Calc EF: 9.1 %
S' Lateral: 5.2 cm
Single Plane A2C EF: 2.3 %
Single Plane A4C EF: 18.7 %

## 2021-01-29 LAB — BASIC METABOLIC PANEL
Anion gap: 8 (ref 5–15)
BUN: 16 mg/dL (ref 6–20)
CO2: 30 mmol/L (ref 22–32)
Calcium: 9.2 mg/dL (ref 8.9–10.3)
Chloride: 98 mmol/L (ref 98–111)
Creatinine, Ser: 0.93 mg/dL (ref 0.61–1.24)
GFR, Estimated: >60 mL/min (ref >60)
Glucose, Bld: 343 mg/dL — ABNORMAL HIGH (ref 70–99)
Potassium: 4.5 mmol/L (ref 3.5–5.1)
Sodium: 136 mmol/L (ref 135–145)

## 2021-01-29 LAB — DIGOXIN LEVEL: Digoxin Level: 0.9 ng/mL (ref 0.8–2.0)

## 2021-01-29 MED ORDER — SPIRONOLACTONE 25 MG PO TABS
25.0000 mg | ORAL_TABLET | Freq: Every day | ORAL | 3 refills | Status: DC
  Filled 2021-01-29: qty 90, 90d supply, fill #0

## 2021-01-29 MED ORDER — SPIRONOLACTONE 25 MG PO TABS
25.0000 mg | ORAL_TABLET | Freq: Every day | ORAL | 3 refills | Status: DC
  Filled 2021-01-29: qty 30, 30d supply, fill #0

## 2021-01-29 NOTE — Patient Instructions (Addendum)
Labs done today. We will contact you only if your labs are abnormal.  STOP taking Aspirin.  INCREASE Spironolactone to 25mg  (1 tablet) by mouth daily at bedtime.   No other medication changes were made. Please continue all current medications as prescribed.  Our social work team will be in contact with you regarding insurance.   You have been referred to Endocrinology for Diabetes. They will contact you to schedule an appointment.  You have been referred to [facility]. They will contact you to schedule an appointment.  Your physician recommends that you schedule a follow-up appointment in: 10 days for a alb only appointment and in 1 month for a appointment with Dr. [facility].  If you have any questions or concerns before your next appointment please send Shirlee Latch a message through [place] or call our office at [phone].    TO LEAVE A MESSAGE FOR THE NURSE SELECT OPTION 2, PLEASE LEAVE A MESSAGE INCLUDING: YOUR NAME DATE OF BIRTH CALL BACK NUMBER REASON FOR CALL**this is important as we prioritize the call backs  YOU WILL RECEIVE A CALL BACK THE SAME DAY AS LONG AS YOU CALL BEFORE 4:00 PM   Do the following things EVERYDAY: Weigh yourself in the morning before breakfast. Write it down and keep it in a log. Take your medicines as prescribed Eat low salt foods--Limit salt (sodium) to 2000 mg per day.  Stay as active as you can everyday Limit all fluids for the day to less than 2 liters   At [facility], you and your health needs are our priority. As part of our continuing mission to provide you with exceptional heart care, we have created designated Provider Care Teams. These Care Teams include your primary Cardiologist (physician) and Advanced Practice Providers (APPs- Physician Assistants and Nurse Practitioners) who all work together to provide you with the care you need, when you need it.   You may see any of the following providers on your  designated Care Team at your next follow up: Dr [phone] Dr Arvilla Meres, NP Carron Curie, Robbie Lis [place], PharmD   Please be sure to bring in all your medications bottles to every appointment.

## 2021-01-29 NOTE — Progress Notes (Signed)
PCP: Dr. Karle Plumber Primary Cardiologist: Dr. Aundra Dubin  HPI: 35 y.o.male with diabetes, HTN, and a long history of nonischemic cardiomyopathy. He had a cardiac MRI in 1/08 showing low EF, but he says that he had been told about "heart problems" even prior to that.  He does not have a family history of cardiomyopathy that he knows of, but does not know his father's family.  Coronary CT angiogram in 2014 showed no CAD. Echo in 2/17 showed EF 20-25%, similar to 2014. Had repeat echo in 2020 showing LVEF 15-20%, RV normal. He had COVID-19 in 4/22.   He presented to [facility] ED [facility]/11/20 with tingling in left arm, left face, drooling and blank stare for about 1 week, and admitted for possible seizure, HHS and possible CVA.  In ED, he had left-sided neurologic deficit with dysarthria.  CT head with probable subacute infarct in right frontal lobe and old right parietal infarct.  Reportedly, patient's neurologic deficits resolved in ED, and focus shifted to possible seizure versus stroke.  He was loaded with Keppra.  MRI brain with probable hemorrhagic infarct involving the anterior right frontal lobe and left parietal occipital cortex with additional chronic right parietal infarct, right frontotemporal region enhancement suggesting changes related to acute seizure, possible toxic insult involving the caudate and lentiform nuclei bilaterally.  Neurology recommended interval follow-up brain MRI.  CTA head and neck without significant finding.  TTE with EF of 10-15%, LV thrombus, moderate RV dysfunction and dilated IVC.  He was discharged on GDMT, but A1c too high for SGLT2i.    Seen in [facility] ED 09/21/20 for fall after syncopal episode on toilet, hitting his head. CT of the head was significant for no bleeding but new low-density left cerebellum suggesting acute infarct. EEG unremarkable and felt fall secondary to vagal episode not seizure.  He had atypical chest pain at his hospital follow up. With his history of HLD and DM,  decisions was made to undergo [facility] to evaluate coronary anatomy and hemodynamics.  R/LHC (7/22) showed normal filling pressures, preserved cardiac output and no significant CAD. RA mean 1, RV 28/1, PA 19/7 mean 12, PCWP 3, CO/CI 5.54/2.92.  Again admitted 12/14/20-12/16/20 for syncopal episode. CT head negative. He was given IVF, beta blocker/spiro/Entresto held initially. Suspected orthostatic vs micturition syncope. EP consulted with known low EF and plans for ICD implant when a1c better controlled.  CPX in 9/22 showed severe HF limitation. Echo was done today and reviewed, EF < 20% with mild LV dilation, no LV thrombus noted, normal RV size with mildly decreased systolic function, IVC normal.   He returns for followup of CHF.  Occasional mild dizziness with standing but no further loss of consciousness.  Main complaint is neuropathic pain, still quite significant, takes Lyrica but this does not seem to help much.  He does ok walking on flat ground slowly, short of breath walking fast.  He went to a [employer] and was "completely worn out" walking to his seat (had to climb lots of stairs).  No orthopnea/PND.  No chest pain.  Weight down 5 lbs.  Labs (9/22): K 3.5, creatinine 0.85, digoxin 0.4  ROS: All systems negative except as listed in HPI, PMH and Problem List.  PMH:  1. Type 1 diabetes: Generally poorly controlled.  2. 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  3. H/o LV thrombus 4. Syncope: Vagal vs orthostatic, likely not arrhythmic.  5. Diabetic polyneuropathy.  6. Chronic systolic CHF: Nonischemic  cardiomyopathy.  Long-standing.  - Cardiac MRI (1/08): EF 45%, no late gadolinium enhancement.  - Coronary CTA (7/14): No significant CAD - Echo (7/14): EF 20-25%, severe LV dilation.  - Echo (6/22): EF 10-15%, LV thrombus, mild LV dilation, moderately decreased RV systolic function with normal size, mild MR, dilated IVC.  - LHC/RHC (7/22): No CAD; mean  RA 1, PA 19/7, mean PCWP 3, CI 2.92.  - Echo (10/22): EF < 20%, mild LV dilation, normal RV size with mildly decreased systolic function, IVC normal.  - CPX (9/22): peak VO2 10.4, VE/VCO2 slope 26, RER 1.22 => severe HF limitation.  7. 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   SH:  Social History   Socioeconomic History   Marital status: Married    Spouse name: Not on file   Number of children: Not on file   Years of education: Not on file   Highest education level: Not on file  Occupational History   Occupation: unemployed  Tobacco Use   Smoking status: Never   Smokeless tobacco: Never  Vaping Use   Vaping Use: Not on file  Substance and Sexual Activity   Alcohol use: Not Currently    Comment: "occasional" when "hanging out with the wrong people" No recent use.   Drug use: Not Currently    Types: Marijuana    Comment: occasional, last 2013   Sexual activity: Not Currently  Other Topics Concern   Not on file  Social History Narrative   Pt lives at home with his wife    Right handed    Caffeine- hardly any   Social Determinants of Health   Financial Resource Strain: High Risk   Difficulty of Paying Living Expenses: Hard  Food Insecurity: No Food Insecurity   Worried About Charity fundraiser in the Last Year: Never true   Ran Out of Food in the Last Year: Never true  Transportation Needs: Unmet Transportation Needs   Lack of Transportation (Medical): No   Lack of Transportation (Non-Medical): Yes  Physical Activity: Not on file  Stress: Not on file  Social Connections: Not on file  Intimate Partner Violence: Not on file   FH:  Family History  Problem Relation Age of Onset   Stroke Mother    Diabetes Mother    Hypertension Mother    Stroke Maternal Aunt    Heart attack Neg Hx     Current Outpatient Medications  Medication Sig Dispense Refill   acetaminophen (TYLENOL) 500 MG tablet Take 1,000 mg by mouth every 8 (eight) hours as needed for mild pain, fever  or headache.     atorvastatin (LIPITOR) 80 MG tablet Take 1 tablet (80 mg total) by mouth daily. 30 tablet 2   Blood Glucose Monitoring Suppl (TRUE METRIX METER) w/Device KIT 1 each by Does not apply route 3 (three) times daily. 1 kit 0   Blood Pressure Monitor DEVI Use as directed to check home blood pressure 2-3 times a week 1 Device 0   digoxin (LANOXIN) 0.125 MG tablet Take 1 tablet (0.125 mg total) by mouth daily. 30 tablet 2   ezetimibe (ZETIA) 10 MG tablet Take 1 tablet (10 mg total) by mouth daily. 30 tablet 2   glucose blood (TRUE METRIX BLOOD GLUCOSE TEST) test strip Use as instructed 100 each 12   ibuprofen (ADVIL) 600 MG tablet Take 600 mg by mouth every 6 (six) hours as needed.     insulin isophane & regular human KwikPen (NOVOLIN 70/30 KWIKPEN) (  70-30) 100 UNIT/ML KwikPen 22 units SQ Q a.m and 18 units Q p.m 13.2 mL 5   Insulin Pen Needle (TRUEPLUS 5-BEVEL PEN NEEDLES) 32G X 4 MM MISC Use in the morning, at noon, and at bedtime. 100 each 2   Insulin Syringe-Needle U-100 (RELION INSULIN SYRINGE) 31G X 15/64" 0.3 ML MISC Use to inject insulin daily. 100 each 11   ivabradine (CORLANOR) 7.5 MG TABS tablet Take 1 tablet (7.5 mg total) by mouth 2 (two) times daily with a meal. 60 tablet    levETIRAcetam (KEPPRA) 750 MG tablet Take 2 tablets (1,500 mg total) by mouth 2 (two) times daily. 120 tablet 3   Menthol, Topical Analgesic, (ICY HOT BACK EX) Apply 1 application topically as needed (neuropathy). Icy hot cream     metFORMIN (GLUCOPHAGE) 500 MG tablet Take 1 tablet (500 mg total) by mouth 2 (two) times daily with a meal. 60 tablet 4   metoprolol (TOPROL-XL) 200 MG 24 hr tablet Take 1 tablet (200 mg total) by mouth daily. Take with or immediately following a meal. 30 tablet 2   Oxcarbazepine (TRILEPTAL) 300 MG tablet Take 2 tablets (600 mg total) by mouth 2 (two) times daily. 120 tablet 3   phenytoin (DILANTIN) 100 MG ER capsule Take 1 capsule (100 mg total) by mouth 3 (three) times daily.  90 capsule 5   pregabalin (LYRICA) 100 MG capsule Take 1 capsule (100 mg total) by mouth 3 (three) times daily. 90 capsule 2   sacubitril-valsartan (ENTRESTO) 24-26 MG Take 1 tablet by mouth 2 (two) times daily. 60 tablet 0   TRUEplus Lancets 28G MISC Use as directed 100 each 4   warfarin (COUMADIN) 5 MG tablet Take 1.5 tablets (7.5 mg total) by mouth daily. (Patient taking differently: Take 5-7.5 mg by mouth See admin instructions. 7.47m on Tuesday Thursday Saturday 591mon Monday, Wednesday, Friday, Sunday) 45 tablet 1   spironolactone (ALDACTONE) 25 MG tablet Take 1 tablet (25 mg total) by mouth at bedtime. 90 tablet 3   No current facility-administered medications for this encounter.   BP (!) 90/58   Pulse 98   Wt 67.8 kg (149 lb 6.4 oz)   SpO2 98%   BMI 20.84 kg/m   Wt Readings from Last 3 Encounters:  01/29/21 67.8 kg (149 lb 6.4 oz)  01/11/21 69.1 kg (152 lb 6.4 oz)  01/04/21 68.1 kg (150 lb 3.2 oz)   PHYSICAL EXAM: General: NAD Neck: No JVD, no thyromegaly or thyroid nodule.  Lungs: Clear to auscultation bilaterally with normal respiratory effort. CV: Nondisplaced PMI.  Heart regular S1/S2, no S3/S4, no murmur.  No peripheral edema.  No carotid bruit.  Normal pedal pulses.  Abdomen: Soft, nontender, no hepatosplenomegaly, no distention.  Skin: Intact without lesions or rashes.  Neurologic: Alert and oriented x 3.  Psych: Normal affect. Extremities: No clubbing or cyanosis.  HEENT: Normal.   ASSESSMENT & PLAN:  1.  Syncope: Suspect this was orthostatic vs vagal rather than arrhythmic.  No further events. Will likely be able to tolerate only low-dose Entresto.  - Plan on ICD placement when he has insurance, still working on MeKohl'sneeds to be seen by social work today).  2. Chronic systolic CHF: Nonischemic cardiomyopathy known for years (since at least 2008).  Cardiac MRI in 2008 with EF 45%, no LGE.  Normal coronary CTA in 2014.  Echo (7/22) with EF 10-15%, LV thrombus,  moderate RV dysfunction, dilated IVC. R/LHC (6/22) showed normal filling pressures, preserved cardiac  output and no significant CAD. CPX in 9/22 showed severe HF limitation concerning for advanced HF.  Echo today showed EF < 20% with mild LV dilation, no LV thrombus noted, normal RV size with mildly decreased systolic function, IVC normal.  NYHA class III symptoms chronically, he is not volume overloaded on exam.  BP remains soft with SBP in 90s.  - Increase spironolactone to 25 mg daily (take at bedtime).  BMET today and again in 10 days.  - Continue Entresto 24/26 mg bid (will not titrate further with soft BP) - Continue Toprol XL 200 mg daily. - Continue Ivabradine 7.5 mg bid.  - Continue digoxin 0.125. Check dig level today. - Continue lasix 20 mg PRN weight gain/edema. - Hold off on SGLT2i with type 1 diabetes on significant insulin, will refer to endocrinology to see if SGLT2 inhibitor use would be reasonable in the future.  - Though cardiac output was preserved on 6/22 RHC, CPX showed a severe HF limitation concerning for advanced HF.  I am going to refer to Dr. Mosetta Pigeon at [facility] for a transplant evaluation.  He will need to get Medicaid (social work is helping him with this).  - EP planning on ICD when he has Medicaid.  Narrow QRS, so not CRT candidate.  3. Diabetes: Insulin-dependent, started in his 88s.  Control better recently but has been poor in past.  - Refer to endocrinology. Would he be a candidate for SGLT2 inhibitor?  4. 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  - On keppra, dilantin 5. 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   - On coumadin and statin.  6. LV Thrombus: with associated CVA. Most recent echo (done today) did not show a thrombus.  - On coumadin.   Loralie Champagne 01/29/2021

## 2021-01-29 NOTE — Progress Notes (Signed)
  Echocardiogram 2D Echocardiogram has been performed.  Jason Stevenson 01/29/2021, 9:05 AM

## 2021-01-30 ENCOUNTER — Other Ambulatory Visit: Payer: Self-pay

## 2021-01-30 ENCOUNTER — Other Ambulatory Visit (HOSPITAL_COMMUNITY): Payer: Self-pay

## 2021-01-30 ENCOUNTER — Ambulatory Visit (INDEPENDENT_AMBULATORY_CARE_PROVIDER_SITE_OTHER): Payer: Medicaid Other | Admitting: *Deleted

## 2021-01-30 ENCOUNTER — Ambulatory Visit: Payer: Self-pay | Admitting: Cardiology

## 2021-01-30 ENCOUNTER — Other Ambulatory Visit: Payer: Self-pay | Admitting: Cardiology

## 2021-01-30 DIAGNOSIS — Z7901 Long term (current) use of anticoagulants: Secondary | ICD-10-CM

## 2021-01-30 DIAGNOSIS — I639 Cerebral infarction, unspecified: Secondary | ICD-10-CM

## 2021-01-30 DIAGNOSIS — Z5181 Encounter for therapeutic drug level monitoring: Secondary | ICD-10-CM | POA: Diagnosis not present

## 2021-01-30 DIAGNOSIS — I513 Intracardiac thrombosis, not elsewhere classified: Secondary | ICD-10-CM

## 2021-01-30 LAB — POCT INR: INR: 2.3 (ref 2.0–3.0)

## 2021-01-30 MED ORDER — WARFARIN SODIUM 5 MG PO TABS
5.0000 mg | ORAL_TABLET | Freq: Every day | ORAL | 1 refills | Status: DC
  Filled 2021-01-30: qty 40, 26d supply, fill #0
  Filled 2021-03-02: qty 40, 26d supply, fill #1

## 2021-01-30 NOTE — Patient Instructions (Signed)
Description   Continue taking 1 tablet daily except for 1.5 tablets on Tuesdays and Saturdays. Keep 2 leafy veggies in your diet weekly. Recheck in 3 weeks. [facility] [phone].

## 2021-01-31 ENCOUNTER — Other Ambulatory Visit: Payer: Self-pay

## 2021-01-31 ENCOUNTER — Other Ambulatory Visit (HOSPITAL_COMMUNITY): Payer: Self-pay

## 2021-01-31 NOTE — Progress Notes (Signed)
Paramedicine Encounter    Patient ID: Jason Stevenson, male    DOB: December 07, 1985, 35 y.o.   MRN: [id number]   Patient Care Team: Ladell Pier, MD as PCP - General (Internal Medicine) Josue Hector, MD as PCP - Cardiology (Cardiology)  Patient Active Problem List   Diagnosis Date Noted   Ischemic cardiomyopathy    Syncope 12/14/2020   Chest pain 10/30/2020   History of cerebrovascular accident (CVA) with residual deficit 10/27/2020   Protein-calorie malnutrition, severe 09/22/2020   Acute CVA (cerebrovascular accident) ([place]) 09/21/2020   Near syncope 09/21/2020   Encounter for monitoring Coumadin therapy 09/20/2020   LV (left ventricular) mural thrombus 09/13/2020   Seizure ([place]) 09/13/2020   Neurological deficit present 09/12/2020   History of COVID-19 09/12/2020   Intracerebral hemorrhage 09/12/2020   Acute cerebrovascular accident (CVA) ([place]) 09/12/2020   Hyperosmolar hyperglycemic state (HHS) ([place]) 09/11/2020   COVID-19 08/23/2020   Noncompliance with medication treatment due to intermittent use of medication 10/12/2018   Dyslipidemia 04/17/2016   Uncontrolled type 2 diabetes mellitus without complication, with long-term current use of insulin 00/71/2197   Chronic systolic CHF (congestive heart failure) ([place]) 06/18/2015   Needs flu shot 12/23/2013   Type 2 diabetes mellitus ([place]) 11/18/2013   Diabetes ([place]) 06/17/2013   Non-ischemic cardiomyopathy ([place]) 11/06/2012   HTN (hypertension) 11/06/2012   HLD (hyperlipidemia) 11/06/2012    Current Outpatient Medications:    acetaminophen (TYLENOL) 500 MG tablet, Take 1,000 mg by mouth every 8 (eight) hours as needed for mild pain, fever or headache., Disp: , Rfl:    atorvastatin (LIPITOR) 80 MG tablet, Take 1 tablet (80 mg total) by mouth daily., Disp: 30 tablet, Rfl: 2   Blood Glucose Monitoring Suppl (TRUE METRIX METER) w/Device KIT, 1 each by Does not apply route 3 (three) times daily., Disp: 1 kit, Rfl: 0   Blood  Pressure Monitor DEVI, Use as directed to check home blood pressure 2-3 times a week, Disp: 1 Device, Rfl: 0   digoxin (LANOXIN) 0.125 MG tablet, Take 1 tablet (0.125 mg total) by mouth daily., Disp: 30 tablet, Rfl: 2   ezetimibe (ZETIA) 10 MG tablet, Take 1 tablet (10 mg total) by mouth daily., Disp: 30 tablet, Rfl: 2   glucose blood (TRUE METRIX BLOOD GLUCOSE TEST) test strip, Use as instructed, Disp: 100 each, Rfl: 12   ibuprofen (ADVIL) 600 MG tablet, Take 600 mg by mouth every 6 (six) hours as needed., Disp: , Rfl:    insulin isophane & regular human KwikPen (NOVOLIN 70/30 KWIKPEN) (70-30) 100 UNIT/ML KwikPen, 22 units SQ Q a.m and 18 units Q p.m, Disp: 13.2 mL, Rfl: 5   Insulin Pen Needle (TRUEPLUS 5-BEVEL PEN NEEDLES) 32G X 4 MM MISC, Use in the morning, at noon, and at bedtime., Disp: 100 each, Rfl: 2   Insulin Syringe-Needle U-100 (RELION INSULIN SYRINGE) 31G X 15/64" 0.3 ML MISC, Use to inject insulin daily., Disp: 100 each, Rfl: 11   ivabradine (CORLANOR) 7.5 MG TABS tablet, Take 1 tablet (7.5 mg total) by mouth 2 (two) times daily with a meal., Disp: 60 tablet, Rfl:    levETIRAcetam (KEPPRA) 750 MG tablet, Take 2 tablets (1,500 mg total) by mouth 2 (two) times daily., Disp: 120 tablet, Rfl: 3   Menthol, Topical Analgesic, (ICY HOT BACK EX), Apply 1 application topically as needed (neuropathy). Icy hot cream, Disp: , Rfl:    metFORMIN (GLUCOPHAGE) 500 MG tablet, Take 1 tablet (500 mg total) by mouth 2 (two) times  daily with a meal., Disp: 60 tablet, Rfl: 4   metoprolol (TOPROL-XL) 200 MG 24 hr tablet, Take 1 tablet (200 mg total) by mouth daily. Take with or immediately following a meal., Disp: 30 tablet, Rfl: 2   Oxcarbazepine (TRILEPTAL) 300 MG tablet, Take 2 tablets (600 mg total) by mouth 2 (two) times daily., Disp: 120 tablet, Rfl: 3   phenytoin (DILANTIN) 100 MG ER capsule, Take 1 capsule (100 mg total) by mouth 3 (three) times daily., Disp: 90 capsule, Rfl: 5   pregabalin (LYRICA)  100 MG capsule, Take 1 capsule (100 mg total) by mouth 3 (three) times daily., Disp: 90 capsule, Rfl: 2   sacubitril-valsartan (ENTRESTO) 24-26 MG, Take 1 tablet by mouth 2 (two) times daily., Disp: 60 tablet, Rfl: 0   spironolactone (ALDACTONE) 25 MG tablet, Take 1 tablet (25 mg total) by mouth at bedtime., Disp: 90 tablet, Rfl: 3   TRUEplus Lancets 28G MISC, Use as directed, Disp: 100 each, Rfl: 4   warfarin (COUMADIN) 5 MG tablet, Take 1-1.5 tablets (5-7.5 mg total) by mouth daily as directed by Coumadin clinic, Disp: 40 tablet, Rfl: 1 No Known Allergies   Social History   Socioeconomic History   Marital status: Married    Spouse name: Not on file   Number of children: Not on file   Years of education: Not on file   Highest education level: Not on file  Occupational History   Occupation: unemployed  Tobacco Use   Smoking status: Never   Smokeless tobacco: Never  Vaping Use   Vaping Use: Not on file  Substance and Sexual Activity   Alcohol use: Not Currently    Comment: "occasional" when "hanging out with the wrong people" No recent use.   Drug use: Not Currently    Types: Marijuana    Comment: occasional, last 2013   Sexual activity: Not Currently  Other Topics Concern   Not on file  Social History Narrative   Pt lives at home with his wife    Right handed    Caffeine- hardly any   Social Determinants of Health   Financial Resource Strain: High Risk   Difficulty of Paying Living Expenses: Hard  Food Insecurity: No Food Insecurity   Worried About Charity fundraiser in the Last Year: Never true   Ran Out of Food in the Last Year: Never true  Transportation Needs: Unmet Transportation Needs   Lack of Transportation (Medical): No   Lack of Transportation (Non-Medical): Yes  Physical Activity: Not on file  Stress: Not on file  Social Connections: Not on file  Intimate Partner Violence: Not on file    Physical Exam Vitals reviewed.  Constitutional:       Appearance: Normal appearance. He is normal weight.  HENT:     Head: Normocephalic.     Nose: Nose normal.     Mouth/Throat:     Mouth: Mucous membranes are moist.     Pharynx: Oropharynx is clear.  Eyes:     Conjunctiva/sclera: Conjunctivae normal.     Pupils: Pupils are equal, round, and reactive to light.  Cardiovascular:     Rate and Rhythm: Normal rate and regular rhythm.     Pulses: Normal pulses.     Heart sounds: Normal heart sounds.  Pulmonary:     Effort: Pulmonary effort is normal.     Breath sounds: Normal breath sounds.  Abdominal:     General: Abdomen is flat.     Palpations: Abdomen  is soft.  Musculoskeletal:        General: No swelling. Normal range of motion.     Cervical back: Normal range of motion.     Right lower leg: No edema.     Left lower leg: No edema.  Skin:    General: Skin is warm and dry.     Capillary Refill: Capillary refill takes less than 2 seconds.  Neurological:     General: No focal deficit present.     Mental Status: He is alert. Mental status is at baseline.  Psychiatric:        Mood and Affect: Mood normal.    Arrived for home visit for Areeb who reports feeling okay just tired with hand and feet pain which has been normal for him for over three months. Taji has been med compliant for the last few weeks and denies dizziness, chest pain but said he does get short of breath when walking fast or up stairs.   Abrahim admits to missing two days of insulin, today's CBG is 467. We had a at length discussion of importance of staying med compliant with his insulin as well as maintaining a healthy diet. He agreed and plans to get back on track. He took insulin at 1500 today and CBG went to 346 at 1625.   I reviewed medications and filled pill box for one week.   We reviewed upcoming appointments, he plans to set up his own transportation using [employer].   I advised him to continue with our plan of care, he agreed. I will see Joesiah in  one week.   Visit complete.  No refills needed.     Future Appointments  Date Time Provider [place]  02/05/2021  9:30 AM Ladell Pier, MD [facility] None  02/07/2021  1:15 PM Vickie Epley, MD [facility]  02/08/2021  9:15 AM [facility] None  02/20/2021  9:30 AM [facility]  03/02/2021  8:40 AM Larey Dresser, MD [facility] None  06/26/2021  9:30 AM Penumalli, Earlean Polka, MD [facility] None     ACTION: Home visit completed

## 2021-02-01 ENCOUNTER — Telehealth (HOSPITAL_COMMUNITY): Payer: Self-pay | Admitting: Licensed Clinical Social Worker

## 2021-02-01 NOTE — Telephone Encounter (Signed)
[employer], research (medical) requesting assistance with paying for pt prescription which he is unable to afford at this time- was able to assist with petty cash for $14 copay  CSW called [employer] to check in again on status of Medicaid case and inform that pt is needing transplant- they will reach out to [employer] and try to help expedite by sending documents explaining need for transplant.  Will continue to follow and assist as needed  Burna Sis, LCSW Clinical Social Worker [facility] Desk#: [phone] Cell#: [phone]

## 2021-02-02 ENCOUNTER — Telehealth (HOSPITAL_COMMUNITY): Payer: Self-pay | Admitting: Licensed Clinical Social Worker

## 2021-02-02 NOTE — Telephone Encounter (Signed)
CSW received call from [employer] regarding pt pending Medicaid case.  States it would appear as if pt was found not to be disabled but they have requested an appeal and are hopeful with the documentation showing patient needs transplant evaluation that they can expedite this process.  CSW will continue to follow and assist as needed  Burna Sis, LCSW Clinical Social Worker [facility] Desk#: [phone] Cell#: [phone]

## 2021-02-05 ENCOUNTER — Other Ambulatory Visit: Payer: Self-pay

## 2021-02-05 ENCOUNTER — Encounter: Payer: Self-pay | Admitting: Physical Medicine & Rehabilitation

## 2021-02-05 ENCOUNTER — Encounter: Payer: Self-pay | Admitting: Internal Medicine

## 2021-02-05 ENCOUNTER — Other Ambulatory Visit (HOSPITAL_COMMUNITY): Payer: Self-pay

## 2021-02-05 ENCOUNTER — Telehealth (HOSPITAL_COMMUNITY): Payer: Self-pay | Admitting: Pharmacy Technician

## 2021-02-05 ENCOUNTER — Ambulatory Visit: Payer: Medicaid Other | Attending: Internal Medicine | Admitting: Internal Medicine

## 2021-02-05 VITALS — BP 105/74 | HR 105 | Resp 16 | Wt 148.4 lb

## 2021-02-05 DIAGNOSIS — Z79899 Other long term (current) drug therapy: Secondary | ICD-10-CM | POA: Diagnosis not present

## 2021-02-05 DIAGNOSIS — E1159 Type 2 diabetes mellitus with other circulatory complications: Secondary | ICD-10-CM | POA: Insufficient documentation

## 2021-02-05 DIAGNOSIS — E1165 Type 2 diabetes mellitus with hyperglycemia: Secondary | ICD-10-CM | POA: Diagnosis not present

## 2021-02-05 DIAGNOSIS — Z8673 Personal history of transient ischemic attack (TIA), and cerebral infarction without residual deficits: Secondary | ICD-10-CM | POA: Diagnosis not present

## 2021-02-05 DIAGNOSIS — E785 Hyperlipidemia, unspecified: Secondary | ICD-10-CM | POA: Insufficient documentation

## 2021-02-05 DIAGNOSIS — F320 Major depressive disorder, single episode, mild: Secondary | ICD-10-CM | POA: Diagnosis not present

## 2021-02-05 DIAGNOSIS — E1142 Type 2 diabetes mellitus with diabetic polyneuropathy: Secondary | ICD-10-CM | POA: Diagnosis not present

## 2021-02-05 DIAGNOSIS — Z596 Low income: Secondary | ICD-10-CM | POA: Insufficient documentation

## 2021-02-05 DIAGNOSIS — G8929 Other chronic pain: Secondary | ICD-10-CM | POA: Diagnosis not present

## 2021-02-05 DIAGNOSIS — I152 Hypertension secondary to endocrine disorders: Secondary | ICD-10-CM

## 2021-02-05 DIAGNOSIS — Z09 Encounter for follow-up examination after completed treatment for conditions other than malignant neoplasm: Secondary | ICD-10-CM | POA: Diagnosis not present

## 2021-02-05 DIAGNOSIS — Z794 Long term (current) use of insulin: Secondary | ICD-10-CM | POA: Insufficient documentation

## 2021-02-05 DIAGNOSIS — G40909 Epilepsy, unspecified, not intractable, without status epilepticus: Secondary | ICD-10-CM | POA: Insufficient documentation

## 2021-02-05 DIAGNOSIS — Z7984 Long term (current) use of oral hypoglycemic drugs: Secondary | ICD-10-CM | POA: Insufficient documentation

## 2021-02-05 DIAGNOSIS — E1169 Type 2 diabetes mellitus with other specified complication: Secondary | ICD-10-CM | POA: Diagnosis not present

## 2021-02-05 DIAGNOSIS — M545 Low back pain, unspecified: Secondary | ICD-10-CM | POA: Insufficient documentation

## 2021-02-05 DIAGNOSIS — I110 Hypertensive heart disease with heart failure: Secondary | ICD-10-CM | POA: Insufficient documentation

## 2021-02-05 DIAGNOSIS — Z2821 Immunization not carried out because of patient refusal: Secondary | ICD-10-CM

## 2021-02-05 DIAGNOSIS — Z7901 Long term (current) use of anticoagulants: Secondary | ICD-10-CM | POA: Diagnosis not present

## 2021-02-05 DIAGNOSIS — Z560 Unemployment, unspecified: Secondary | ICD-10-CM | POA: Insufficient documentation

## 2021-02-05 DIAGNOSIS — D649 Anemia, unspecified: Secondary | ICD-10-CM | POA: Diagnosis not present

## 2021-02-05 DIAGNOSIS — Z8249 Family history of ischemic heart disease and other diseases of the circulatory system: Secondary | ICD-10-CM | POA: Diagnosis not present

## 2021-02-05 LAB — GLUCOSE, POCT (MANUAL RESULT ENTRY): POC Glucose: 107 mg/dl — AB (ref 70–99)

## 2021-02-05 MED ORDER — DULOXETINE HCL 20 MG PO CPEP
20.0000 mg | ORAL_CAPSULE | Freq: Every day | ORAL | 3 refills | Status: DC
Start: 2021-02-05 — End: 2021-04-05
  Filled 2021-02-05: qty 30, 30d supply, fill #0
  Filled 2021-03-02: qty 30, 30d supply, fill #1
  Filled 2021-03-29 – 2021-04-05 (×2): qty 30, 30d supply, fill #2

## 2021-02-05 NOTE — Progress Notes (Addendum)
Patient ID: Jason Stevenson, male    DOB: 08/04/1985  MRN: [id number]  CC: Hospitalization Follow-up   Subjective: Jason Stevenson is a 35 y.o. male who presents for hosp f/u and chronic ds management His concerns today include:  Patient with history of DM with painful neuropathy, HTN, HL, NICM with systolic CHF EF <78% ( Cardiologist Dr. 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 Shooting and burning pain.  Not sleeping well at nights because of it.  We recently increased the Lyrica to 100 mg TID.  Also using some OTC cream which is advertised for neuropathy pain.   Hurts to wear regular shoes.  We have referred him to Pain Management Reports some depression because of the pain -compliant with Novolin 70/30 22 a.m/18 p.m and Metformin.  Cardiology raised question of whether okay to add SGLT agent if pt is DM type 1.   Checking BS 1-2 times a day.  Does not have written log put BS from last 3 mornings were 97, 102, 113 Referred to endocrinology.  No appt as yet. Reports decrease apptetite at time -over due for eye exam.  Pt uninsured.  HTN/NICM Currently taking: see medication list.  Reports compliance with Entresto digoxin, Zetia, spironolactone, Corlanor and Metoprolol.  Saw cardioogist Dr. Aundra Dubin 01/29/2021.  Referred to [facility] to be considered for transplant.  Also plan for ICD once pt has insurance. Med Adherence: _0  Yes    _1  No Medication side effects: _2  Yes    _3  No Adherence with salt restriction: _4  Yes    _5  No Home Monitoring?: _6  Yes    _7  No Monitoring Frequency:  Home BP results range:  SOB? _8  Yes    _9  No Chest Pain?: _10  Yes    _11  No Leg swelling?: _12  Yes    _13  No Headaches?: _14  Yes    _15   No Dizziness? _16  Yes    _17  No Comments: occasional bleeding from tooth that needs extracting.  No blood in stools or urine.  INR being monitored through cardiology clinic.  Recent CBC showed decrease H/H from 14/40 to 12.8/38  Saw neurologist Dr. Tish Frederickson 12/20/20.  No further sz. Continued on Keppra, Trileptal and Dilantin  HM:  Due for flu shot. Due for eye exam. Had 2 COVID shots.  Patient Active Problem List   Diagnosis Date Noted   Ischemic cardiomyopathy    Syncope 12/14/2020   Chest pain 10/30/2020   History of cerebrovascular accident (CVA) with residual deficit 10/27/2020   Protein-calorie malnutrition, severe 09/22/2020   Acute CVA (cerebrovascular accident) ([place]) 09/21/2020   Near syncope 09/21/2020   Encounter for monitoring Coumadin therapy 09/20/2020   LV (left ventricular) mural thrombus 09/13/2020   Seizure ([place]) 09/13/2020   Neurological deficit present 09/12/2020   History of COVID-19 09/12/2020   Intracerebral hemorrhage 09/12/2020   Acute cerebrovascular accident (CVA) ([place]) 09/12/2020   Hyperosmolar hyperglycemic state (HHS) ([place]) 09/11/2020   COVID-19 08/23/2020   Noncompliance with medication treatment due to intermittent use of medication 10/12/2018   Dyslipidemia 04/17/2016   Uncontrolled type 2 diabetes mellitus without complication, with long-term current use of insulin 46/96/2952   Chronic systolic CHF (congestive heart failure) ([place]) 06/18/2015   Needs flu shot 12/23/2013  Type 2 diabetes mellitus ([place]) 11/18/2013   Diabetes ([place]) 06/17/2013   Non-ischemic cardiomyopathy ([place]) 11/06/2012   HTN (hypertension) 11/06/2012   HLD (hyperlipidemia) 11/06/2012     Current Outpatient Medications on File Prior to Visit  Medication Sig Dispense Refill   acetaminophen (TYLENOL) 500 MG tablet Take 1,000 mg by mouth every 8 (eight) hours as needed for mild pain, fever or headache.     atorvastatin (LIPITOR) 80 MG tablet Take 1 tablet (80 mg total) by mouth  daily. 30 tablet 2   Blood Glucose Monitoring Suppl (TRUE METRIX METER) w/Device KIT 1 each by Does not apply route 3 (three) times daily. 1 kit 0   Blood Pressure Monitor DEVI Use as directed to check home blood pressure 2-3 times a week 1 Device 0   digoxin (LANOXIN) 0.125 MG tablet Take 1 tablet (0.125 mg total) by mouth daily. 30 tablet 2   ezetimibe (ZETIA) 10 MG tablet Take 1 tablet (10 mg total) by mouth daily. 30 tablet 2   glucose blood (TRUE METRIX BLOOD GLUCOSE TEST) test strip Use as instructed 100 each 12   ibuprofen (ADVIL) 600 MG tablet Take 600 mg by mouth every 6 (six) hours as needed.     insulin isophane & regular human KwikPen (NOVOLIN 70/30 KWIKPEN) (70-30) 100 UNIT/ML KwikPen 22 units SQ Q a.m and 18 units Q p.m 13.2 mL 5   Insulin Pen Needle (TRUEPLUS 5-BEVEL PEN NEEDLES) 32G X 4 MM MISC Use in the morning, at noon, and at bedtime. 100 each 2   Insulin Syringe-Needle U-100 (RELION INSULIN SYRINGE) 31G X 15/64" 0.3 ML MISC Use to inject insulin daily. 100 each 11   ivabradine (CORLANOR) 7.5 MG TABS tablet Take 1 tablet (7.5 mg total) by mouth 2 (two) times daily with a meal. 60 tablet    levETIRAcetam (KEPPRA) 750 MG tablet Take 2 tablets (1,500 mg total) by mouth 2 (two) times daily. 120 tablet 3   Menthol, Topical Analgesic, (ICY HOT BACK EX) Apply 1 application topically as needed (neuropathy). Icy hot cream     metFORMIN (GLUCOPHAGE) 500 MG tablet Take 1 tablet (500 mg total) by mouth 2 (two) times daily with a meal. 60 tablet 4   metoprolol (TOPROL-XL) 200 MG 24 hr tablet Take 1 tablet (200 mg total) by mouth daily. Take with or immediately following a meal. 30 tablet 2   Oxcarbazepine (TRILEPTAL) 300 MG tablet Take 2 tablets (600 mg total) by mouth 2 (two) times daily. 120 tablet 3   phenytoin (DILANTIN) 100 MG ER capsule Take 1 capsule (100 mg total) by mouth 3 (three) times daily. 90 capsule 5   pregabalin (LYRICA) 100 MG capsule Take 1 capsule (100 mg total) by mouth 3  (three) times daily. 90 capsule 2   sacubitril-valsartan (ENTRESTO) 24-26 MG Take 1 tablet by mouth 2 (two) times daily. 60 tablet 0   spironolactone (ALDACTONE) 25 MG tablet Take 1 tablet (25 mg total) by mouth at bedtime. 90 tablet 3   TRUEplus Lancets 28G MISC Use as directed 100 each 4   warfarin (COUMADIN) 5 MG tablet Take 1-1.5 tablets (5-7.5 mg total) by mouth daily as directed by Coumadin clinic 40 tablet 1   No current facility-administered medications on file prior to visit.    No Known Allergies  Social History   Socioeconomic History   Marital status: Married    Spouse name: Not on file   Number of children: Not on file   Years of  education: Not on file   Highest education level: Not on file  Occupational History   Occupation: unemployed  Tobacco Use   Smoking status: Never   Smokeless tobacco: Never  Vaping Use   Vaping Use: Not on file  Substance and Sexual Activity   Alcohol use: Not Currently    Comment: "occasional" when "hanging out with the wrong people" No recent use.   Drug use: Not Currently    Types: Marijuana    Comment: occasional, last 2013   Sexual activity: Not Currently  Other Topics Concern   Not on file  Social History Narrative   Pt lives at home with his wife    Right handed    Caffeine- hardly any   Social Determinants of Health   Financial Resource Strain: High Risk   Difficulty of Paying Living Expenses: Hard  Food Insecurity: No Food Insecurity   Worried About Charity fundraiser in the Last Year: Never true   Ran Out of Food in the Last Year: Never true  Transportation Needs: Unmet Transportation Needs   Lack of Transportation (Medical): No   Lack of Transportation (Non-Medical): Yes  Physical Activity: Not on file  Stress: Not on file  Social Connections: Not on file  Intimate Partner Violence: Not on file    Family History  Problem Relation Age of Onset   Stroke Mother    Diabetes Mother    Hypertension Mother     Stroke Maternal Aunt    Heart attack Neg Hx     Past Surgical History:  Procedure Laterality Date   None     RIGHT/LEFT HEART CATH AND CORONARY ANGIOGRAPHY N/A 11/02/2020   Procedure: RIGHT/LEFT HEART CATH AND CORONARY ANGIOGRAPHY;  Surgeon: Larey Dresser, MD;  Location: [facility];  Service: Cardiovascular;  Laterality: N/A;    ROS: Review of Systems Negative except as stated above  PHYSICAL EXAM: BP 105/74   Pulse (!) 105   Resp 16   Wt 148 lb 6.4 oz (67.3 kg)   SpO2 97%   BMI 20.70 kg/m   Wt Readings from Last 3 Encounters:  02/05/21 148 lb 6.4 oz (67.3 kg)  01/31/21 149 lb 9.6 oz (67.9 kg)  01/29/21 149 lb 6.4 oz (67.8 kg)    Physical Exam   General appearance - alert, well appearing, and in no distress Mental status - normal mood, behavior, speech, dress, motor activity, and thought processes Neck - supple, no significant adenopathy Chest - clear to auscultation, no wheezes, rales or rhonchi, symmetric air entry Heart - normal rate, regular rhythm, normal S1, S2, no murmurs, rubs, clicks or gallops Extremities - peripheral pulses normal, no pedal edema, no clubbing or cyanosis Diabetic Foot Exam - Simple   Simple Foot Form Visual Inspection See comments: Yes Sensation Testing See comments: Yes Pulse Check Posterior Tibialis and Dorsalis pulse intact bilaterally: Yes Comments Flat footed. Decrease sensation on plantar surface on LEAP.    Depression screen [facility] 2/9 02/05/2021 10/27/2020 04/26/2019  Decreased Interest 1 0 0  Down, Depressed, Hopeless 1 1 0  PHQ - 2 Score 2 1 0  Altered sleeping 3 - -  Tired, decreased energy 1 - -  Change in appetite 3 - -  Feeling bad or failure about yourself  1 - -  Trouble concentrating 0 - -  Moving slowly or fidgety/restless 0 - -  Suicidal thoughts 0 - -  PHQ-9 Score 10 - -  Some recent data might be  hidden     CMP Latest Ref Rng & Units 01/29/2021 12/28/2020 12/16/2020  Glucose 70 - 99 mg/dL 343(H)  109(H) 92  BUN 6 - 20 mg/dL _0 Creatinine 0.61 - 1.24 mg/dL 0.93 0.85 0.79  Sodium 135 - 145 mmol/L 136 138 138  Potassium 3.5 - 5.1 mmol/L 4.5 3.5 4.1  Chloride 98 - 111 mmol/L 98 102 103  CO2 22 - 32 mmol/L _1 Calcium 8.9 - 10.3 mg/dL 9.2 8.6(L) 9.2  Total Protein 6.5 - 8.1 g/dL - - -  Total Bilirubin 0.3 - 1.2 mg/dL - - -  Alkaline Phos 38 - 126 U/L - - -  AST 15 - 41 U/L - - -  ALT 0 - 44 U/L - - -   Lipid Panel     Component Value Date/Time   CHOL 153 12/04/2020 1018   CHOL 181 10/24/2020 1115   TRIG 115 12/04/2020 1018   HDL 49 12/04/2020 1018   HDL 67 10/24/2020 1115   CHOLHDL 3.1 12/04/2020 1018   VLDL 23 12/04/2020 1018   LDLCALC 81 12/04/2020 1018   LDLCALC 94 10/24/2020 1115    CBC    Component Value Date/Time   WBC 4.2 12/28/2020 1525   RBC 4.40 12/28/2020 1525   HGB 12.8 (L) 12/28/2020 1525   HCT 38.1 (L) 12/28/2020 1525   PLT 259 12/28/2020 1525   MCV 86.6 12/28/2020 1525   MCH 29.1 12/28/2020 1525   MCHC 33.6 12/28/2020 1525   RDW 11.6 12/28/2020 1525   LYMPHSABS 3.2 12/14/2020 0314   MONOABS 0.4 12/14/2020 0314   EOSABS 0.1 12/14/2020 0314   BASOSABS 0.1 12/14/2020 0314    ASSESSMENT AND PLAN:  1. Type 2 diabetes mellitus with diabetic polyneuropathy, with long-term current use of insulin (HCC) A1c is not at goal As far as I know patient has DM type 2.  Check abs.  If negative, we can add SGLT agent Advise to keep log of BS readings.  Given log sheet.  F/u with clinical pharmacist in 3 wks with readings Continue Lyrica 100 mg TID. Add low dose Cymbalta - POCT glucose (manual entry) - DULoxetine (CYMBALTA) 20 MG capsule; Take 1 capsule (20 mg total) by mouth daily.  Dispense: 30 capsule; Refill: 3 - Glutamic acid decarboxylase auto abs - Anti-islet cell antibody  2. Hypertension associated with diabetes ([place]) At goal.  Continue current meds  3. Normocytic anemia Recheck CBC with iron studies - CBC - Iron, TIBC and Ferritin  Panel  4. Mild major depression ([place]) -pt feels this is related to his chronic neuropathy pain.  Pt advise that Cymbalta will help with both depression and neuropathy symptoms  5. Influenza vaccination declined He will try to find a place where he can get it for free.  Patient was given the opportunity to ask questions.  Patient verbalized understanding of the plan and was able to repeat key elements of the plan.   Orders Placed This Encounter  Procedures   CBC   Iron, TIBC and Ferritin Panel   Glutamic acid decarboxylase auto abs   Anti-islet cell antibody   POCT glucose (manual entry)     Requested Prescriptions   Signed Prescriptions Disp Refills   DULoxetine (CYMBALTA) 20 MG capsule 30 capsule 3    Sig: Take 1 capsule (20 mg total) by mouth daily.    Return in about 4 months (around 06/08/2021) for Give appt with Lurena Joiner in 3 wks for [facility]  check.  Karle Plumber, MD, FACP

## 2021-02-05 NOTE — Telephone Encounter (Signed)
Advanced [facility] Failure Patient Advocate Encounter   Patient was approved to receive Entresto from [employer]  Patient ID: [id number] Effective dates: 01/17/21 through 01/17/22  Called and left the patient a message.  Archer Asa, CPhT

## 2021-02-05 NOTE — Progress Notes (Signed)
Pt states he is having issues with his feet

## 2021-02-05 NOTE — Patient Instructions (Signed)
We have added the medication called Cymbalta to help with neuropathy pain.  It will also help with depression.  We referred her to the pain specialist.  Please record your blood sugars checking them twice a day before breakfast and before dinner.  Bring in your record with you to see the clinical pharmacist in 3 weeks.

## 2021-02-07 ENCOUNTER — Encounter: Payer: Self-pay | Admitting: Cardiology

## 2021-02-07 ENCOUNTER — Other Ambulatory Visit: Payer: Self-pay

## 2021-02-07 ENCOUNTER — Ambulatory Visit (INDEPENDENT_AMBULATORY_CARE_PROVIDER_SITE_OTHER): Payer: Medicaid Other | Admitting: Cardiology

## 2021-02-07 VITALS — BP 108/64 | HR 86 | Ht 71.0 in | Wt 149.0 lb

## 2021-02-07 DIAGNOSIS — I513 Intracardiac thrombosis, not elsewhere classified: Secondary | ICD-10-CM

## 2021-02-07 DIAGNOSIS — I5022 Chronic systolic (congestive) heart failure: Secondary | ICD-10-CM | POA: Diagnosis not present

## 2021-02-07 DIAGNOSIS — Z794 Long term (current) use of insulin: Secondary | ICD-10-CM | POA: Diagnosis not present

## 2021-02-07 DIAGNOSIS — E1159 Type 2 diabetes mellitus with other circulatory complications: Secondary | ICD-10-CM

## 2021-02-07 DIAGNOSIS — I639 Cerebral infarction, unspecified: Secondary | ICD-10-CM | POA: Diagnosis not present

## 2021-02-07 NOTE — Patient Instructions (Signed)
Medication Instructions:  Your physician recommends that you continue on your current medications as directed. Please refer to the Current Medication list given to you today.  *If you need a refill on your cardiac medications before your next appointment, please call your pharmacy*   Lab Work: None ordered If you have labs (blood work) drawn today and your tests are completely normal, you will receive your results only by: MyChart Message (if you have MyChart) OR A paper copy in the mail If you have any lab test that is abnormal or we need to change your treatment, we will call you to review the results.   Testing/Procedures: None ordered   Follow-Up: At [facility], you and your health needs are our priority.  As part of our continuing mission to provide you with exceptional heart care, we have created designated Provider Care Teams.  These Care Teams include your primary Cardiologist (physician) and Advanced Practice Providers (APPs -  Physician Assistants and Nurse Practitioners) who all work together to provide you with the care you need, when you need it.  We recommend signing up for the patient portal called "MyChart".  Sign up information is provided on this After Visit Summary.  MyChart is used to connect with patients for Virtual Visits (Telemedicine).  Patients are able to view lab/test results, encounter notes, upcoming appointments, etc.  Non-urgent messages can be sent to your provider as well.   To learn more about what you can do with MyChart, go to [email or web address].    Your next appointment:   6 month(s)  The format for your next appointment:   In Person  Provider:   Steffanie Dunn, MD    Thank you for choosing [facility]!!   Dory Horn, RN [phone]   Other Instructions

## 2021-02-07 NOTE — Progress Notes (Signed)
Electrophysiology Office Follow up Visit Note:    Date:  02/07/2021   ID:  Jason Stevenson, DOB [birth year], MRN [id number]  PCP:  Ladell Pier, MD  [facility] Cardiologist:  Jenkins Rouge, MD  [facility] Electrophysiologist:  Vickie Epley, MD    Interval History:    Jason Stevenson is a 35 y.o. male who presents for a follow up visit.  I first met the patient when he was hospitalized on December 14, 2020 for his severe nonischemic cardiomyopathy.  He presented with syncope seem to be related to [facility] and volume depletion.  When I first met him I thought ICD was indicated but the patient was working on obtaining insurance coverage.  He is still working on Public affairs consultant and has a Risk analyst.  He is also being referred to [facility] for consideration of transplant.  Since he has left the hospital, the patient tells me he is doing overall okay.  He still very fatigued and has a poor exercise tolerance.  No syncopal episodes.  Tolerating his medications.  Is having some neuropathic pain for which his primary team is trying different medical regimens-both pill form and topicals.       Past Medical History:  Diagnosis Date   Chronic systolic CHF (congestive heart failure) ([place]) 06/18/2015   Diabetes mellitus without complication ([place])    Hemorrhagic stroke ([place])    Hyperlipidemia    Hypertension    Nonischemic cardiomyopathy ([place]) Noted as early as 2007   Per chart review (cards consult note 2011), EF of 40% in 2007, down to 20-25% in 2011    Past Surgical History:  Procedure Laterality Date   None     RIGHT/LEFT HEART CATH AND CORONARY ANGIOGRAPHY N/A 11/02/2020   Procedure: RIGHT/LEFT HEART CATH AND CORONARY ANGIOGRAPHY;  Surgeon: Larey Dresser, MD;  Location: [place] [facility];  Service: Cardiovascular;  Laterality: N/A;    Current Medications: Current Meds  Medication Sig   acetaminophen (TYLENOL) 500 MG tablet Take 1,000 mg by mouth  every 8 (eight) hours as needed for mild pain, fever or headache.   atorvastatin (LIPITOR) 80 MG tablet Take 1 tablet (80 mg total) by mouth daily.   Blood Glucose Monitoring Suppl (TRUE METRIX METER) w/Device KIT 1 each by Does not apply route 3 (three) times daily.   Blood Pressure Monitor DEVI Use as directed to check home blood pressure 2-3 times a week   digoxin (LANOXIN) 0.125 MG tablet Take 1 tablet (0.125 mg total) by mouth daily.   DULoxetine (CYMBALTA) 20 MG capsule Take 1 capsule (20 mg total) by mouth daily.   ezetimibe (ZETIA) 10 MG tablet Take 1 tablet (10 mg total) by mouth daily.   glucose blood (TRUE METRIX BLOOD GLUCOSE TEST) test strip Use as instructed   ibuprofen (ADVIL) 600 MG tablet Take 600 mg by mouth every 6 (six) hours as needed.   insulin isophane & regular human KwikPen (NOVOLIN 70/30 KWIKPEN) (70-30) 100 UNIT/ML KwikPen 22 units SQ Q a.m and 18 units Q p.m   Insulin Pen Needle (TRUEPLUS 5-BEVEL PEN NEEDLES) 32G X 4 MM MISC Use in the morning, at noon, and at bedtime.   Insulin Syringe-Needle U-100 (RELION INSULIN SYRINGE) 31G X 15/64" 0.3 ML MISC Use to inject insulin daily.   ivabradine (CORLANOR) 7.5 MG TABS tablet Take 1 tablet (7.5 mg total) by mouth 2 (two) times daily with a meal.   levETIRAcetam (KEPPRA) 750 MG tablet Take 2 tablets (1,500 mg  total) by mouth 2 (two) times daily.   Menthol, Topical Analgesic, (ICY HOT BACK EX) Apply 1 application topically as needed (neuropathy). Icy hot cream   metFORMIN (GLUCOPHAGE) 500 MG tablet Take 1 tablet (500 mg total) by mouth 2 (two) times daily with a meal.   metoprolol (TOPROL-XL) 200 MG 24 hr tablet Take 1 tablet (200 mg total) by mouth daily. Take with or immediately following a meal.   Oxcarbazepine (TRILEPTAL) 300 MG tablet Take 2 tablets (600 mg total) by mouth 2 (two) times daily.   phenytoin (DILANTIN) 100 MG ER capsule Take 1 capsule (100 mg total) by mouth 3 (three) times daily.   pregabalin (LYRICA) 100 MG  capsule Take 1 capsule (100 mg total) by mouth 3 (three) times daily.   sacubitril-valsartan (ENTRESTO) 24-26 MG Take 1 tablet by mouth 2 (two) times daily.   spironolactone (ALDACTONE) 25 MG tablet Take 1 tablet (25 mg total) by mouth at bedtime.   TRUEplus Lancets 28G MISC Use as directed   warfarin (COUMADIN) 5 MG tablet Take 1-1.5 tablets (5-7.5 mg total) by mouth daily as directed by Coumadin clinic     Allergies:   Patient has no known allergies.   Social History   Socioeconomic History   Marital status: Married    Spouse name: Not on file   Number of children: Not on file   Years of education: Not on file   Highest education level: Not on file  Occupational History   Occupation: unemployed  Tobacco Use   Smoking status: Never   Smokeless tobacco: Never  Vaping Use   Vaping Use: Not on file  Substance and Sexual Activity   Alcohol use: Not Currently    Comment: "occasional" when "hanging out with the wrong people" No recent use.   Drug use: Not Currently    Types: Marijuana    Comment: occasional, last 2013   Sexual activity: Not Currently  Other Topics Concern   Not on file  Social History Narrative   Pt lives at home with his wife    Right handed    Caffeine- hardly any   Social Determinants of Health   Financial Resource Strain: High Risk   Difficulty of Paying Living Expenses: Hard  Food Insecurity: No Food Insecurity   Worried About Charity fundraiser in the Last Year: Never true   Ran Out of Food in the Last Year: Never true  Transportation Needs: Unmet Transportation Needs   Lack of Transportation (Medical): No   Lack of Transportation (Non-Medical): Yes  Physical Activity: Not on file  Stress: Not on file  Social Connections: Not on file     Family History: The patient's family history includes Diabetes in his mother; Hypertension in his mother; Stroke in his maternal aunt and mother. There is no history of Heart attack.  ROS:   Please see the  history of present illness.    All other systems reviewed and are negative.  EKGs/Labs/Other Studies Reviewed:    The following studies were reviewed today: Hospital records  EKG:  The ekg ordered today demonstrates sinus rhythm  Recent Labs: 09/19/2020: Magnesium 1.9 09/22/2020: ALT 42 12/14/2020: B Natriuretic Peptide 54.0; TSH 1.741 01/29/2021: BUN 16; Creatinine, Ser 0.93; Potassium 4.5; Sodium 136 02/05/2021: Hemoglobin 12.6; Platelets 233  Recent Lipid Panel    Component Value Date/Time   CHOL 153 12/04/2020 1018   CHOL 181 10/24/2020 1115   TRIG 115 12/04/2020 1018   HDL 49 12/04/2020 1018  HDL 67 10/24/2020 1115   CHOLHDL 3.1 12/04/2020 1018   VLDL 23 12/04/2020 1018   LDLCALC 81 12/04/2020 1018   LDLCALC 94 10/24/2020 1115    Physical Exam:    VS:  BP 108/64   Pulse 86   Ht _0  (1.803 m)   Wt 149 lb (67.6 kg)   SpO2 96%   BMI 20.78 kg/m     Wt Readings from Last 3 Encounters:  02/07/21 149 lb (67.6 kg)  02/05/21 148 lb 6.4 oz (67.3 kg)  01/31/21 149 lb 9.6 oz (67.9 kg)     GEN:  Well nourished, well developed in no acute distress HEENT: Normal NECK: No JVD; No carotid bruits LYMPHATICS: No lymphadenopathy CARDIAC: RRR, no murmurs, rubs, gallops RESPIRATORY:  Clear to auscultation without rales, wheezing or rhonchi  ABDOMEN: Soft, non-tender, non-distended MUSCULOSKELETAL:  No edema; No deformity  SKIN: Warm and dry NEUROLOGIC:  Alert and oriented x 3 PSYCHIATRIC:  Normal affect        ASSESSMENT:    1. Chronic systolic CHF (congestive heart failure) ([place])   2. Type 2 diabetes mellitus with other circulatory complication, with long-term current use of insulin ([place])   3. LV (left ventricular) mural thrombus   4. Acute cerebrovascular accident (CVA) ([place])    PLAN:    In order of problems listed above:  1. Chronic systolic CHF (congestive heart failure) (HCC) NYHA class IV.  Undergoing transplant evaluation here at [facility] and at [facility].  On medical therapy and followed by Dr. Aundra Dubin in the heart failure clinic.  I do think an ICD would be indicated for him.  He is working on Set designer.  Once this is through we can proceed with scheduling.  2. Type 2 diabetes mellitus with other circulatory complication, with long-term current use of insulin ([place]) He has had uncontrolled diabetes in the past.  I would like to make sure his diabetes is well controlled before we implant any sort of defibrillator device in an effort to minimize infection risk.  3. LV (left ventricular) mural thrombus 4. Acute cerebrovascular accident (CVA) ([place]) On Coumadin.  For now, we will set a follow-up appointment in 6 months.  He will let us know sooner if his insurance application goes throu is approved so we can get him scheduled sooner.     Medication Adjustments/Labs and Tests Ordered: Current medicines are reviewed at length with the patient today.  Concerns regarding medicines are outlined above.  No orders of the defined types were placed in this encounter.  No orders of the defined types were placed in this encounter.    Signed, Lars Mage, MD, [facility], [facility] 02/07/2021 11:54 PM    Electrophysiology [facility]

## 2021-02-08 ENCOUNTER — Other Ambulatory Visit (HOSPITAL_COMMUNITY): Payer: Self-pay

## 2021-02-08 ENCOUNTER — Telehealth (HOSPITAL_COMMUNITY): Payer: Self-pay | Admitting: *Deleted

## 2021-02-08 ENCOUNTER — Ambulatory Visit (HOSPITAL_COMMUNITY)
Admission: RE | Admit: 2021-02-08 | Discharge: 2021-02-08 | Disposition: A | Discharge status: Home / Self Care | Payer: Medicaid Other | Source: Ambulatory Visit | Attending: Cardiology | Admitting: Cardiology

## 2021-02-08 ENCOUNTER — Other Ambulatory Visit: Payer: Self-pay

## 2021-02-08 DIAGNOSIS — I5022 Chronic systolic (congestive) heart failure: Secondary | ICD-10-CM | POA: Insufficient documentation

## 2021-02-08 LAB — BASIC METABOLIC PANEL
Anion gap: 6 (ref 5–15)
BUN: 11 mg/dL (ref 6–20)
CO2: 29 mmol/L (ref 22–32)
Calcium: 9.1 mg/dL (ref 8.9–10.3)
Chloride: 104 mmol/L (ref 98–111)
Creatinine, Ser: 0.8 mg/dL (ref 0.61–1.24)
GFR, Estimated: >60 mL/min (ref >60)
Glucose, Bld: 97 mg/dL (ref 70–99)
Potassium: 4.1 mmol/L (ref 3.5–5.1)
Sodium: 139 mmol/L (ref 135–145)

## 2021-02-08 LAB — GLUTAMIC ACID DECARBOXYLASE AUTO ABS: Glutamic Acid Decarb Ab: 20.6 U/mL — ABNORMAL HIGH (ref 0.0–5.0)

## 2021-02-08 LAB — CBC
Hematocrit: 36.7 % — ABNORMAL LOW (ref 37.5–51.0)
Hemoglobin: 12.6 g/dL — ABNORMAL LOW (ref 13.0–17.7)
MCH: 28.9 pg (ref 26.6–33.0)
MCHC: 34.3 g/dL (ref 31.5–35.7)
MCV: 84 fL (ref 79–97)
Platelets: 233 10*3/uL (ref 150–450)
RBC: 4.36 x10E6/uL (ref 4.14–5.80)
RDW: 10.9 % — ABNORMAL LOW (ref 11.6–15.4)
WBC: 5.1 10*3/uL (ref 3.4–10.8)

## 2021-02-08 LAB — IRON,TIBC AND FERRITIN PANEL
Ferritin: 324 ng/mL (ref 30–400)
Iron Saturation: 48 % (ref 15–55)
Iron: 123 ug/dL (ref 38–169)
Total Iron Binding Capacity: 258 ug/dL (ref 250–450)
UIBC: 135 ug/dL (ref 111–343)

## 2021-02-08 LAB — ANTI-ISLET CELL ANTIBODY: Islet Cell Ab: NEGATIVE

## 2021-02-08 NOTE — Telephone Encounter (Signed)
Jason Stevenson, pt's community paramedic called to report pt has been having increased dizziness and low BP today. She states his BP has been a little lower for past month or so (ever since starting entresto), pt checks daily and it has been 90s-110 on his machine. Today he has increased dizziness, especially with standing or bending over. BP 80/48, when pt stood it dropped to 76 systolically. Pt denies any sob/edema/abn bleeding, wt is stable, no new meds or dietary changes. Per Robbie Lis, PA pt should hold entresto and spiro tonight, hold entresto tomorrow am and get bmet/cbc.  Per chart pt had CBC this week hgb stable, bmet done yesterday normal  Pt will hold meds as ordered above and will restart 10/28 pm, he will keep log of BP Twice daily and any time he feels more dizzy. He is aware to hold entresto if SBP <90, parameters to call our office reviewed. Herbert Seta will f/u with pt in person again next week.

## 2021-02-08 NOTE — Progress Notes (Signed)
Paramedicine Encounter    Patient ID: Jason Stevenson, male    DOB: 1985-04-24, 35 y.o.   MRN: [id number]   Patient Care Team: Ladell Pier, MD as PCP - General (Internal Medicine) Josue Hector, MD as PCP - Cardiology (Cardiology) Vickie Epley, MD as PCP - Electrophysiology (Cardiology)  Patient Active Problem List   Diagnosis Date Noted   Ischemic cardiomyopathy    Syncope 12/14/2020   Chest pain 10/30/2020   History of cerebrovascular accident (CVA) with residual deficit 10/27/2020   Protein-calorie malnutrition, severe 09/22/2020   Acute CVA (cerebrovascular accident) ([place]) 09/21/2020   Near syncope 09/21/2020   Encounter for monitoring Coumadin therapy 09/20/2020   LV (left ventricular) mural thrombus 09/13/2020   Seizure ([place]) 09/13/2020   Neurological deficit present 09/12/2020   History of COVID-19 09/12/2020   Intracerebral hemorrhage 09/12/2020   Acute cerebrovascular accident (CVA) ([place]) 09/12/2020   Hyperosmolar hyperglycemic state (HHS) ([place]) 09/11/2020   COVID-19 08/23/2020   Noncompliance with medication treatment due to intermittent use of medication 10/12/2018   Dyslipidemia 04/17/2016   Uncontrolled type 2 diabetes mellitus without complication, with long-term current use of insulin 46/27/0350   Chronic systolic CHF (congestive heart failure) ([name]) 06/18/2015   Needs flu shot 12/23/2013   Type 2 diabetes mellitus ([place]) 11/18/2013   Diabetes ([place]) 06/17/2013   Non-ischemic cardiomyopathy ([place]) 11/06/2012   HTN (hypertension) 11/06/2012   HLD (hyperlipidemia) 11/06/2012    Current Outpatient Medications:    acetaminophen (TYLENOL) 500 MG tablet, Take 1,000 mg by mouth every 8 (eight) hours as needed for mild pain, fever or headache., Disp: , Rfl:    atorvastatin (LIPITOR) 80 MG tablet, Take 1 tablet (80 mg total) by mouth daily., Disp: 30 tablet, Rfl: 2   Blood Glucose Monitoring Suppl (TRUE METRIX METER) w/Device KIT, 1 each by Does not apply  route 3 (three) times daily., Disp: 1 kit, Rfl: 0   Blood Pressure Monitor DEVI, Use as directed to check home blood pressure 2-3 times a week, Disp: 1 Device, Rfl: 0   digoxin (LANOXIN) 0.125 MG tablet, Take 1 tablet (0.125 mg total) by mouth daily., Disp: 30 tablet, Rfl: 2   DULoxetine (CYMBALTA) 20 MG capsule, Take 1 capsule (20 mg total) by mouth daily., Disp: 30 capsule, Rfl: 3   ezetimibe (ZETIA) 10 MG tablet, Take 1 tablet (10 mg total) by mouth daily., Disp: 30 tablet, Rfl: 2   glucose blood (TRUE METRIX BLOOD GLUCOSE TEST) test strip, Use as instructed, Disp: 100 each, Rfl: 12   ibuprofen (ADVIL) 600 MG tablet, Take 600 mg by mouth every 6 (six) hours as needed., Disp: , Rfl:    insulin isophane & regular human KwikPen (NOVOLIN 70/30 KWIKPEN) (70-30) 100 UNIT/ML KwikPen, 22 units SQ Q a.m and 18 units Q p.m, Disp: 13.2 mL, Rfl: 5   Insulin Pen Needle (TRUEPLUS 5-BEVEL PEN NEEDLES) 32G X 4 MM MISC, Use in the morning, at noon, and at bedtime., Disp: 100 each, Rfl: 2   Insulin Syringe-Needle U-100 (RELION INSULIN SYRINGE) 31G X 15/64" 0.3 ML MISC, Use to inject insulin daily., Disp: 100 each, Rfl: 11   ivabradine (CORLANOR) 7.5 MG TABS tablet, Take 1 tablet (7.5 mg total) by mouth 2 (two) times daily with a meal., Disp: 60 tablet, Rfl:    levETIRAcetam (KEPPRA) 750 MG tablet, Take 2 tablets (1,500 mg total) by mouth 2 (two) times daily., Disp: 120 tablet, Rfl: 3   Menthol, Topical Analgesic, (ICY HOT BACK EX), Apply 1  application topically as needed (neuropathy). Icy hot cream, Disp: , Rfl:    metFORMIN (GLUCOPHAGE) 500 MG tablet, Take 1 tablet (500 mg total) by mouth 2 (two) times daily with a meal., Disp: 60 tablet, Rfl: 4   metoprolol (TOPROL-XL) 200 MG 24 hr tablet, Take 1 tablet (200 mg total) by mouth daily. Take with or immediately following a meal., Disp: 30 tablet, Rfl: 2   Oxcarbazepine (TRILEPTAL) 300 MG tablet, Take 2 tablets (600 mg total) by mouth 2 (two) times daily., Disp: 120  tablet, Rfl: 3   phenytoin (DILANTIN) 100 MG ER capsule, Take 1 capsule (100 mg total) by mouth 3 (three) times daily., Disp: 90 capsule, Rfl: 5   pregabalin (LYRICA) 100 MG capsule, Take 1 capsule (100 mg total) by mouth 3 (three) times daily., Disp: 90 capsule, Rfl: 2   sacubitril-valsartan (ENTRESTO) 24-26 MG, Take 1 tablet by mouth 2 (two) times daily., Disp: 60 tablet, Rfl: 0   spironolactone (ALDACTONE) 25 MG tablet, Take 1 tablet (25 mg total) by mouth at bedtime., Disp: 90 tablet, Rfl: 3   TRUEplus Lancets 28G MISC, Use as directed, Disp: 100 each, Rfl: 4   warfarin (COUMADIN) 5 MG tablet, Take 1-1.5 tablets (5-7.5 mg total) by mouth daily as directed by Coumadin clinic, Disp: 40 tablet, Rfl: 1 No Known Allergies   Social History   Socioeconomic History   Marital status: Married    Spouse name: Not on file   Number of children: Not on file   Years of education: Not on file   Highest education level: Not on file  Occupational History   Occupation: unemployed  Tobacco Use   Smoking status: Never   Smokeless tobacco: Never  Vaping Use   Vaping Use: Not on file  Substance and Sexual Activity   Alcohol use: Not Currently    Comment: "occasional" when "hanging out with the wrong people" No recent use.   Drug use: Not Currently    Types: Marijuana    Comment: occasional, last 2013   Sexual activity: Not Currently  Other Topics Concern   Not on file  Social History Narrative   Pt lives at home with his wife    Right handed    Caffeine- hardly any   Social Determinants of Health   Financial Resource Strain: High Risk   Difficulty of Paying Living Expenses: Hard  Food Insecurity: No Food Insecurity   Worried About Running Out of Food in the Last Year: Never true   Ran Out of Food in the Last Year: Never true  Transportation Needs: Unmet Transportation Needs   Lack of Transportation (Medical): No   Lack of Transportation (Non-Medical): Yes  Physical Activity: Not on file   Stress: Not on file  Social Connections: Not on file  Intimate Partner Violence: Not on file    Physical Exam Vitals reviewed.  Constitutional:      Appearance: Normal appearance. He is normal weight.  HENT:     Head: Normocephalic.     Nose: Nose normal.     Mouth/Throat:     Mouth: Mucous membranes are moist.     Pharynx: Oropharynx is clear.  Eyes:     Conjunctiva/sclera: Conjunctivae normal.     Pupils: Pupils are equal, round, and reactive to light.  Cardiovascular:     Rate and Rhythm: Normal rate and regular rhythm.     Pulses: Normal pulses.     Heart sounds: Normal heart sounds.  Pulmonary:       Effort: Pulmonary effort is normal.     Breath sounds: Normal breath sounds.  Abdominal:     General: Abdomen is flat.     Palpations: Abdomen is soft.  Musculoskeletal:        General: No swelling. Normal range of motion.     Cervical back: Normal range of motion.     Right lower leg: No edema.     Left lower leg: No edema.  Skin:    General: Skin is warm and dry.     Capillary Refill: Capillary refill takes less than 2 seconds.  Neurological:     General: No focal deficit present.     Mental Status: He is alert. Mental status is at baseline.  Psychiatric:        Mood and Affect: Mood normal.   Arrived for home visit for Jason Stevenson who was alert and oriented reporting to be feeling tired on assessment. He admitted to a few bouts of dizziness over the last week with on episode of chest pain on Tuesday in which he reported to Dr. Quentin Ore and an EKG was preformed. Today Jason Stevenson had labs obtained at HF clinic. I obtained vitals and orthostatic vitals where Jason Stevenson became dizzy upon standing and notably positive orthostatic. I called HF clinic and was ordered to hold [employer] and tomorrow, hold spironolactone tonight. These were removed from pill box.   Jason Stevenson started 17m Cymbalta today.   I instructed GDuglasto be sure he is checking his vitals twice daily and logging  same in his notes. He agreed.   I filled one week of medications in pill box. I plan to see GJobaniin one week, I will follow up via phone call Monday. Home visit complete.   Refills: Digoxin Metoprolol Phenytoin Oxcarb.    CBG-98  Future Appointments  Date Time Provider [place] 02/20/2021  9:30 AM [facility]  02/26/2021  9:30 AM VTresa Endo RPH-CPP [facility] None  03/02/2021  8:40 AM MLarey Dresser MD [facility] None  04/20/2021 10:45 AM Kirsteins, ALuanna Salk MD CPR-PR[facility] CPR  05/16/2021  9:30 AM VTresa Endo RPH-CPP [facility] None  06/26/2021  9:30 AM Penumalli, VEarlean Polka MD GNA-[facility]NA None     ACTION: Home visit completed

## 2021-02-15 ENCOUNTER — Other Ambulatory Visit (HOSPITAL_COMMUNITY): Payer: Self-pay

## 2021-02-15 ENCOUNTER — Other Ambulatory Visit: Payer: Self-pay

## 2021-02-15 MED ORDER — INSULIN PEN NEEDLE 32G X 4 MM MISC
0 refills | Status: DC
  Filled 2021-02-15: qty 100, 33d supply, fill #0
  Filled 2021-03-22: qty 100, 33d supply, fill #1

## 2021-02-15 NOTE — Progress Notes (Signed)
Paramedicine Encounter    Patient ID: Jason Stevenson, male    DOB: 05/25/1985, 35 y.o.   MRN: [id number]   Patient Care Team: Johnson, Deborah B, MD as PCP - General (Internal Medicine) Nishan, Peter C, MD as PCP - Cardiology (Cardiology) Lambert, Cameron T, MD as PCP - Electrophysiology (Cardiology)  Patient Active Problem List   Diagnosis Date Noted   Ischemic cardiomyopathy    Syncope 12/14/2020   Chest pain 10/30/2020   History of cerebrovascular accident (CVA) with residual deficit 10/27/2020   Protein-calorie malnutrition, severe 09/22/2020   Acute CVA (cerebrovascular accident) (HCC) 09/21/2020   Near syncope 09/21/2020   Encounter for monitoring Coumadin therapy 09/20/2020   LV (left ventricular) mural thrombus 09/13/2020   Seizure (HCC) 09/13/2020   Neurological deficit present 09/12/2020   History of COVID-19 09/12/2020   Intracerebral hemorrhage 09/12/2020   Acute cerebrovascular accident (CVA) (HCC) 09/12/2020   Hyperosmolar hyperglycemic state (HHS) (HCC) 09/11/2020   COVID-19 08/23/2020   Noncompliance with medication treatment due to intermittent use of medication 10/12/2018   Dyslipidemia 04/17/2016   Uncontrolled type 2 diabetes mellitus without complication, with long-term current use of insulin 08/07/2015   Chronic systolic CHF (congestive heart failure) (HCC) 06/18/2015   Needs flu shot 12/23/2013   Type 2 diabetes mellitus (HCC) 11/18/2013   Diabetes (HCC) 06/17/2013   Non-ischemic cardiomyopathy (HCC) 11/06/2012   HTN (hypertension) 11/06/2012   HLD (hyperlipidemia) 11/06/2012    Current Outpatient Medications:    acetaminophen (TYLENOL) 500 MG tablet, Take 1,000 mg by mouth every 8 (eight) hours as needed for mild pain, fever or headache., Disp: , Rfl:    atorvastatin (LIPITOR) 80 MG tablet, Take 1 tablet (80 mg total) by mouth daily., Disp: 30 tablet, Rfl: 2   Blood Glucose Monitoring Suppl (TRUE METRIX METER) w/Device KIT, 1 each by Does not apply  route 3 (three) times daily., Disp: 1 kit, Rfl: 0   Blood Pressure Monitor DEVI, Use as directed to check home blood pressure 2-3 times a week, Disp: 1 Device, Rfl: 0   digoxin (LANOXIN) 0.125 MG tablet, Take 1 tablet (0.125 mg total) by mouth daily., Disp: 30 tablet, Rfl: 2   DULoxetine (CYMBALTA) 20 MG capsule, Take 1 capsule (20 mg total) by mouth daily., Disp: 30 capsule, Rfl: 3   ezetimibe (ZETIA) 10 MG tablet, Take 1 tablet (10 mg total) by mouth daily., Disp: 30 tablet, Rfl: 2   glucose blood (TRUE METRIX BLOOD GLUCOSE TEST) test strip, Use as instructed, Disp: 100 each, Rfl: 12   ibuprofen (ADVIL) 600 MG tablet, Take 600 mg by mouth every 6 (six) hours as needed., Disp: , Rfl:    insulin isophane & regular human KwikPen (NOVOLIN 70/30 KWIKPEN) (70-30) 100 UNIT/ML KwikPen, 22 units SQ Q a.m and 18 units Q p.m, Disp: 13.2 mL, Rfl: 5   Insulin Pen Needle (TRUEPLUS 5-BEVEL PEN NEEDLES) 32G X 4 MM MISC, Use in the morning, at noon, and at bedtime., Disp: 100 each, Rfl: 2   Insulin Syringe-Needle U-100 (RELION INSULIN SYRINGE) 31G X 15/64" 0.3 ML MISC, Use to inject insulin daily., Disp: 100 each, Rfl: 11   ivabradine (CORLANOR) 7.5 MG TABS tablet, Take 1 tablet (7.5 mg total) by mouth 2 (two) times daily with a meal., Disp: 60 tablet, Rfl:    levETIRAcetam (KEPPRA) 750 MG tablet, Take 2 tablets (1,500 mg total) by mouth 2 (two) times daily., Disp: 120 tablet, Rfl: 3   Menthol, Topical Analgesic, (ICY HOT BACK EX), Apply 1   application topically as needed (neuropathy). Icy hot cream, Disp: , Rfl:    metFORMIN (GLUCOPHAGE) 500 MG tablet, Take 1 tablet (500 mg total) by mouth 2 (two) times daily with a meal., Disp: 60 tablet, Rfl: 4   metoprolol (TOPROL-XL) 200 MG 24 hr tablet, Take 1 tablet (200 mg total) by mouth daily. Take with or immediately following a meal., Disp: 30 tablet, Rfl: 2   Oxcarbazepine (TRILEPTAL) 300 MG tablet, Take 2 tablets (600 mg total) by mouth 2 (two) times daily., Disp: 120  tablet, Rfl: 3   phenytoin (DILANTIN) 100 MG ER capsule, Take 1 capsule (100 mg total) by mouth 3 (three) times daily., Disp: 90 capsule, Rfl: 5   pregabalin (LYRICA) 100 MG capsule, Take 1 capsule (100 mg total) by mouth 3 (three) times daily., Disp: 90 capsule, Rfl: 2   sacubitril-valsartan (ENTRESTO) 24-26 MG, Take 1 tablet by mouth 2 (two) times daily., Disp: 60 tablet, Rfl: 0   spironolactone (ALDACTONE) 25 MG tablet, Take 1 tablet (25 mg total) by mouth at bedtime., Disp: 90 tablet, Rfl: 3   TRUEplus Lancets 28G MISC, Use as directed, Disp: 100 each, Rfl: 4   warfarin (COUMADIN) 5 MG tablet, Take 1-1.5 tablets (5-7.5 mg total) by mouth daily as directed by Coumadin clinic, Disp: 40 tablet, Rfl: 1 No Known Allergies   Social History   Socioeconomic History   Marital status: Married    Spouse name: Not on file   Number of children: Not on file   Years of education: Not on file   Highest education level: Not on file  Occupational History   Occupation: unemployed  Tobacco Use   Smoking status: Never   Smokeless tobacco: Never  Vaping Use   Vaping Use: Not on file  Substance and Sexual Activity   Alcohol use: Not Currently    Comment: "occasional" when "hanging out with the wrong people" No recent use.   Drug use: Not Currently    Types: Marijuana    Comment: occasional, last 2013   Sexual activity: Not Currently  Other Topics Concern   Not on file  Social History Narrative   Pt lives at home with his wife    Right handed    Caffeine- hardly any   Social Determinants of Health   Financial Resource Strain: High Risk   Difficulty of Paying Living Expenses: Hard  Food Insecurity: No Food Insecurity   Worried About Running Out of Food in the Last Year: Never true   Ran Out of Food in the Last Year: Never true  Transportation Needs: Unmet Transportation Needs   Lack of Transportation (Medical): No   Lack of Transportation (Non-Medical): Yes  Physical Activity: Not on file   Stress: Not on file  Social Connections: Not on file  Intimate Partner Violence: Not on file    Physical Exam Vitals reviewed.  Constitutional:      Appearance: Normal appearance. He is normal weight.  HENT:     Head: Normocephalic.     Nose: Nose normal.     Mouth/Throat:     Mouth: Mucous membranes are moist.     Pharynx: Oropharynx is clear.  Eyes:     Conjunctiva/sclera: Conjunctivae normal.     Pupils: Pupils are equal, round, and reactive to light.  Cardiovascular:     Rate and Rhythm: Normal rate and regular rhythm.     Pulses: Normal pulses.     Heart sounds: Normal heart sounds.  Pulmonary:       Effort: Pulmonary effort is normal.     Breath sounds: Normal breath sounds.  Abdominal:     General: Abdomen is flat.     Palpations: Abdomen is soft.  Musculoskeletal:        General: No swelling. Normal range of motion.     Cervical back: Normal range of motion.     Right lower leg: No edema.     Left lower leg: No edema.  Skin:    General: Skin is warm and dry.     Capillary Refill: Capillary refill takes less than 2 seconds.  Neurological:     General: No focal deficit present.     Mental Status: He is alert. Mental status is at baseline.  Psychiatric:        Mood and Affect: Mood normal.    Arrived for home visit for Baldomero who reports feeling okay but having some continued dizziness and chest pain over the last week. He states he gets light headed when standing or bending over and chest tightness during these times as well. Vitals revealed 7lb weight gain as well as hypotension. Jim admitted to eating more salty snacks over the last week. Blood sugars have been maintained. I contacted clinic reference these symptoms and Dr. Aundra Dubin ordered to reduce Metoprolol to 111m (1/2 tablet) and Spironolactone to 12.53m(1/2 tablet)   Changes were made and pill box filled accordingly.   GeShedad no shortness of breath and no pedal edema noted. Lung sounds clear with  no JVD.   I called in all appropriate refills and confirmed upcoming appointments. I plan to see patient in one week. He understands to reach out to me if any changes to his symptoms arise. He agreed.   Refills: [facility]- Atorvastatin Zetia  [facility]- Keppra Trileptal Dilantin Pregabalin Pen Needles Test Strips   Checking with [facility] to see if he will accept and waive medicaid (family planning) for non HF meds.     CBG- 86   Future Appointments  Date Time Provider [place]11/11/2020  9:30 AM [facility]  02/26/2021  9:30 AM VaTresa EndoRPH-CPP [facility] None  03/02/2021  8:40 AM McLarey DresserMD [facility] None  04/20/2021 10:45 AM Kirsteins, AnLuanna SalkMD [facility] CPR  05/16/2021  9:30 AM VaTresa EndoRPH-CPP [facility] None  06/26/2021  9:30 AM Penumalli, ViEarlean PolkaMD [facility] None     ACTION: Home visit completed

## 2021-02-16 ENCOUNTER — Telehealth (HOSPITAL_COMMUNITY): Payer: Self-pay | Admitting: *Deleted

## 2021-02-16 ENCOUNTER — Other Ambulatory Visit (HOSPITAL_COMMUNITY): Payer: Self-pay | Admitting: *Deleted

## 2021-02-16 MED ORDER — SPIRONOLACTONE 25 MG PO TABS: 12.5000 mg | ORAL_TABLET | Freq: Every day | ORAL | 3 refills | Status: DC

## 2021-02-16 MED ORDER — METOPROLOL SUCCINATE ER 200 MG PO TB24: 100.0000 mg | ORAL_TABLET | Freq: Every day | ORAL | 2 refills | Status: DC

## 2021-02-16 NOTE — Telephone Encounter (Signed)
Note from Geraldine Solar below  I contacted clinic reference these symptoms and Dr. Shirlee Latch ordered to reduce Metoprolol to 100mg  (1/2 tablet) and Spironolactone to 12.5mg  (1/2 tablet)

## 2021-02-20 ENCOUNTER — Other Ambulatory Visit: Payer: Self-pay

## 2021-02-20 ENCOUNTER — Ambulatory Visit (INDEPENDENT_AMBULATORY_CARE_PROVIDER_SITE_OTHER): Payer: Medicaid Other

## 2021-02-20 DIAGNOSIS — Z7901 Long term (current) use of anticoagulants: Secondary | ICD-10-CM | POA: Diagnosis not present

## 2021-02-20 DIAGNOSIS — I639 Cerebral infarction, unspecified: Secondary | ICD-10-CM | POA: Diagnosis not present

## 2021-02-20 DIAGNOSIS — Z5181 Encounter for therapeutic drug level monitoring: Secondary | ICD-10-CM

## 2021-02-20 DIAGNOSIS — I513 Intracardiac thrombosis, not elsewhere classified: Secondary | ICD-10-CM

## 2021-02-20 LAB — POCT INR: INR: 2.1 (ref 2.0–3.0)

## 2021-02-20 NOTE — Patient Instructions (Signed)
Description   Continue taking 1 tablet daily except for 1.5 tablets on Tuesdays and Saturdays. Keep 2 leafy veggies in your diet weekly. Recheck in 4 weeks. Coumadin Clinic [phone].

## 2021-02-23 ENCOUNTER — Other Ambulatory Visit (HOSPITAL_COMMUNITY): Payer: Self-pay

## 2021-02-25 NOTE — Progress Notes (Signed)
   PCP: Dr. Laural Benes  S:     Patient arrives in good spirits.  Presents for diabetes evaluation, education, and management. Patient was referred and last seen by Primary Care Provider on 02/05/2021.  Patient reports Diabetes was diagnosed in 2011.   Family/Social History:  -Never smoker  Insurance coverage/medication affordability: none  Medication adherence reported .   Current diabetes medications include:  -Novolin 70/30 22 units QAM and 18 units QPM -Metformin 500 mg BID  Current hypertension/heart failure medications include:  -Metoprolol 100 mg QD -Spironolactone 12.5 mg QD -Entresto 24-26 mg QD -Digoxin 0.125 mg daily -Ivabradine 7.5 mg QD  Current hyperlipidemia medications include:  -Atorvastatin 80 mg QD -Ezetimibe 10 mg QD  Patient reported dietary habits: Eats 2-3 meals/day Patient recent reports efforts to improve  Patient-reported exercise habits: limited   Patient denies nocturia (nighttime urination).  Patient reports neuropathy (nerve pain). Patient reports visual changes including blurred vision at distance. Patient reports self foot exams.     O:    Lab Results  Component Value Date   HGBA1C 8.2 (H) 01/29/2021   There were no vitals filed for this visit.  Lipid Panel     Component Value Date/Time   CHOL 153 12/04/2020 1018   CHOL 181 10/24/2020 1115   TRIG 115 12/04/2020 1018   HDL 49 12/04/2020 1018   HDL 67 10/24/2020 1115   CHOLHDL 3.1 12/04/2020 1018   VLDL 23 12/04/2020 1018   LDLCALC 81 12/04/2020 1018   LDLCALC 94 10/24/2020 1115    Home fasting blood sugars: 121, 91, 102, 141, 145, 70, 108  >average 111 2 hour post-meal/random blood sugars: 78, 145, 186, 178, 139, 90 >average 136  Patient reports occasional symptoms of hypoglycemia including shakiness, fatigue, and sweating before taking any insulin for the day.  Clinical Atherosclerotic Cardiovascular Disease (ASCVD): Yes  The ASCVD Risk score (Arnett DK, et al., 2019)  failed to calculate for the following reasons:   The 2019 ASCVD risk score is only valid for ages 85 to 43   The patient has a prior MI or stroke diagnosis    A/P: Diabetes longstanding, currently controlled on current regimen. Patient is able to verbalize appropriate hypoglycemia management plan. Medication adherence appears adequate. Blood glucose is currently controlled.  -Changed Novolin 70/30 to 22 units in the morning with breakfast and 16 units in the evening with dinner -Continue metformin 500 mg twice daily -Extensively discussed pathophysiology of diabetes, recommended lifestyle interventions, dietary effects on blood sugar control -Counseled on s/sx of and management of hypoglycemia -Follow up has been scheduled with Endocrinology for December 5th to rule out type 1 diabetes before starting antidiabetic medications that have cardiovascular risk reduction. -Patient was counseled on hypoglycemia management. -Next A1C anticipated 05/01/2020.   ASCVD risk - secondary prevention in patient with diabetes. Last LDL is below goal, at 81 mg/dL.  - high intensity statin indicated.  -Continued atorvastatin 80 mg daily  Hypertension not assessed this visit.  Written patient instructions provided.  Total time in face to face counseling 32 minutes.   Follow up Pharmacist on 03/28/2021 in clinic. Follow up with endocrinology on 03/19/2021.  Thank you for allowing pharmacy to participate in this patient's care.  Enos Fling, PharmD PGY1 Pharmacy Resident 02/26/2021 12:57 PM

## 2021-02-26 ENCOUNTER — Other Ambulatory Visit: Payer: Self-pay

## 2021-02-26 ENCOUNTER — Other Ambulatory Visit: Payer: Self-pay | Admitting: Pharmacist

## 2021-02-26 ENCOUNTER — Ambulatory Visit: Payer: Medicaid Other | Attending: Internal Medicine | Admitting: Pharmacist

## 2021-02-26 DIAGNOSIS — E1142 Type 2 diabetes mellitus with diabetic polyneuropathy: Secondary | ICD-10-CM

## 2021-02-26 DIAGNOSIS — Z794 Long term (current) use of insulin: Secondary | ICD-10-CM | POA: Diagnosis not present

## 2021-02-26 MED ORDER — NOVOLIN 70/30 FLEXPEN (70-30) 100 UNIT/ML ~~LOC~~ SUPN
PEN_INJECTOR | SUBCUTANEOUS | 5 refills | Status: DC
  Filled 2021-02-26: qty 12, 31d supply, fill #0

## 2021-02-28 ENCOUNTER — Telehealth (HOSPITAL_COMMUNITY): Payer: Self-pay | Admitting: Licensed Clinical Social Worker

## 2021-02-28 NOTE — Telephone Encounter (Signed)
CSW continuing to follow pt and attempt to assist with getting Medicaid as MD thinks he will need to be evaluated for transplant.  Reviewed financial counseling notes and saw that Dionicia Abler was able to obtain a hearing/appeal for pt Medicaid decision:  TUESDAY, NOV 29TH AT 10:30AM.TUESDAY, NOV 29TH AT 10:30AM.  Will plan to check in regarding outcome of that hearing and update providers as needed  Burna Sis, LCSW Clinical Social Worker [facility] Desk#: [phone] Cell#: [phone]

## 2021-03-01 ENCOUNTER — Telehealth: Payer: Self-pay | Admitting: Internal Medicine

## 2021-03-01 ENCOUNTER — Other Ambulatory Visit (HOSPITAL_COMMUNITY): Payer: Self-pay

## 2021-03-01 NOTE — Progress Notes (Signed)
Paramedicine Encounter    Patient ID: Jason Stevenson, male    DOB: [birth year], 35 y.o.   MRN: [id number]   Patient Care Team: Ladell Pier, MD as PCP - General (Internal Medicine) Josue Hector, MD as PCP - Cardiology (Cardiology) Vickie Epley, MD as PCP - Electrophysiology (Cardiology)  Patient Active Problem List   Diagnosis Date Noted   Ischemic cardiomyopathy    Syncope 12/14/2020   Chest pain 10/30/2020   History of cerebrovascular accident (CVA) with residual deficit 10/27/2020   Protein-calorie malnutrition, severe 09/22/2020   Acute CVA (cerebrovascular accident) ([place]) 09/21/2020   Near syncope 09/21/2020   Encounter for monitoring Coumadin therapy 09/20/2020   LV (left ventricular) mural thrombus 09/13/2020   Seizure ([place]) 09/13/2020   Neurological deficit present 09/12/2020   History of COVID-19 09/12/2020   Intracerebral hemorrhage 09/12/2020   Acute cerebrovascular accident (CVA) ([place]) 09/12/2020   Hyperosmolar hyperglycemic state (HHS) ([place]) 09/11/2020   COVID-19 08/23/2020   Noncompliance with medication treatment due to intermittent use of medication 10/12/2018   Dyslipidemia 04/17/2016   Uncontrolled type 2 diabetes mellitus without complication, with long-term current use of insulin 94/76/5465   Chronic systolic CHF (congestive heart failure) ([place]) 06/18/2015   Needs flu shot 12/23/2013   Type 2 diabetes mellitus ([place]) 11/18/2013   Diabetes ([place]) 06/17/2013   Non-ischemic cardiomyopathy ([place]) 11/06/2012   HTN (hypertension) 11/06/2012   HLD (hyperlipidemia) 11/06/2012    Current Outpatient Medications:    acetaminophen (TYLENOL) 500 MG tablet, Take 1,000 mg by mouth every 8 (eight) hours as needed for mild pain, fever or headache., Disp: , Rfl:    atorvastatin (LIPITOR) 80 MG tablet, Take 1 tablet (80 mg total) by mouth daily., Disp: 30 tablet, Rfl: 2   Blood Glucose Monitoring Suppl (TRUE METRIX METER) w/Device KIT, 1 each by Does not apply  route 3 (three) times daily., Disp: 1 kit, Rfl: 0   Blood Pressure Monitor DEVI, Use as directed to check home blood pressure 2-3 times a week, Disp: 1 Device, Rfl: 0   digoxin (LANOXIN) 0.125 MG tablet, Take 1 tablet (0.125 mg total) by mouth daily., Disp: 30 tablet, Rfl: 2   DULoxetine (CYMBALTA) 20 MG capsule, Take 1 capsule (20 mg total) by mouth daily., Disp: 30 capsule, Rfl: 3   ezetimibe (ZETIA) 10 MG tablet, Take 1 tablet (10 mg total) by mouth daily., Disp: 30 tablet, Rfl: 2   glucose blood (TRUE METRIX BLOOD GLUCOSE TEST) test strip, Use as instructed, Disp: 100 each, Rfl: 12   ibuprofen (ADVIL) 600 MG tablet, Take 600 mg by mouth every 6 (six) hours as needed., Disp: , Rfl:    insulin isophane & regular human KwikPen (NOVOLIN 70/30 KWIKPEN) (70-30) 100 UNIT/ML KwikPen, 22 units SQ Q a.m and 16 units Q p.m, Disp: 15 mL, Rfl: 5   Insulin Pen Needle 32G X 4 MM MISC, Use in the morning, at noon, and at bedtime., Disp: 200 each, Rfl: 0   Insulin Syringe-Needle U-100 (RELION INSULIN SYRINGE) 31G X 15/64" 0.3 ML MISC, Use to inject insulin daily., Disp: 100 each, Rfl: 11   ivabradine (CORLANOR) 7.5 MG TABS tablet, Take 1 tablet (7.5 mg total) by mouth 2 (two) times daily with a meal., Disp: 60 tablet, Rfl:    levETIRAcetam (KEPPRA) 750 MG tablet, Take 2 tablets (1,500 mg total) by mouth 2 (two) times daily., Disp: 120 tablet, Rfl: 3   Menthol, Topical Analgesic, (ICY HOT BACK EX), Apply 1 application topically as needed (  neuropathy). Icy hot cream, Disp: , Rfl:    metFORMIN (GLUCOPHAGE) 500 MG tablet, Take 1 tablet (500 mg total) by mouth 2 (two) times daily with a meal., Disp: 60 tablet, Rfl: 4   metoprolol (TOPROL-XL) 200 MG 24 hr tablet, Take 0.5 tablets (100 mg total) by mouth daily. Take with or immediately following a meal., Disp: 30 tablet, Rfl: 2   Oxcarbazepine (TRILEPTAL) 300 MG tablet, Take 2 tablets (600 mg total) by mouth 2 (two) times daily., Disp: 120 tablet, Rfl: 3   phenytoin  (DILANTIN) 100 MG ER capsule, Take 1 capsule (100 mg total) by mouth 3 (three) times daily., Disp: 90 capsule, Rfl: 5   pregabalin (LYRICA) 100 MG capsule, Take 1 capsule (100 mg total) by mouth 3 (three) times daily., Disp: 90 capsule, Rfl: 2   sacubitril-valsartan (ENTRESTO) 24-26 MG, Take 1 tablet by mouth 2 (two) times daily., Disp: 60 tablet, Rfl: 0   spironolactone (ALDACTONE) 25 MG tablet, Take 0.5 tablets (12.5 mg total) by mouth at bedtime., Disp: 90 tablet, Rfl: 3   TRUEplus Lancets 28G MISC, Use as directed, Disp: 100 each, Rfl: 4   warfarin (COUMADIN) 5 MG tablet, Take 1-1.5 tablets (5-7.5 mg total) by mouth daily as directed by Coumadin clinic, Disp: 40 tablet, Rfl: 1 No Known Allergies   Social History   Socioeconomic History   Marital status: Married    Spouse name: Not on file   Number of children: Not on file   Years of education: Not on file   Highest education level: Not on file  Occupational History   Occupation: unemployed  Tobacco Use   Smoking status: Never   Smokeless tobacco: Never  Vaping Use   Vaping Use: Not on file  Substance and Sexual Activity   Alcohol use: Not Currently    Comment: "occasional" when "hanging out with the wrong people" No recent use.   Drug use: Not Currently    Types: Marijuana    Comment: occasional, last 2013   Sexual activity: Not Currently  Other Topics Concern   Not on file  Social History Narrative   Pt lives at home with his wife    Right handed    Caffeine- hardly any   Social Determinants of Health   Financial Resource Strain: High Risk   Difficulty of Paying Living Expenses: Hard  Food Insecurity: No Food Insecurity   Worried About Charity fundraiser in the Last Year: Never true   Ran Out of Food in the Last Year: Never true  Transportation Needs: Unmet Transportation Needs   Lack of Transportation (Medical): No   Lack of Transportation (Non-Medical): Yes  Physical Activity: Not on file  Stress: Not on file   Social Connections: Not on file  Intimate Partner Violence: Not on file    Physical Exam Vitals reviewed.  Constitutional:      Appearance: Normal appearance. He is normal weight.  HENT:     Head: Normocephalic.     Nose: Nose normal.     Mouth/Throat:     Mouth: Mucous membranes are moist.     Pharynx: Oropharynx is clear.  Eyes:     Conjunctiva/sclera: Conjunctivae normal.     Pupils: Pupils are equal, round, and reactive to light.  Cardiovascular:     Rate and Rhythm: Normal rate and regular rhythm.     Pulses: Normal pulses.     Heart sounds: Normal heart sounds.  Pulmonary:     Effort: Pulmonary effort is  normal.     Breath sounds: Normal breath sounds.  Abdominal:     General: Abdomen is flat.     Palpations: Abdomen is soft.  Musculoskeletal:        General: Swelling present. Normal range of motion.     Cervical back: Normal range of motion.     Right lower leg: No edema.     Left lower leg: Edema present.  Skin:    General: Skin is warm and dry.     Capillary Refill: Capillary refill takes less than 2 seconds.  Neurological:     General: No focal deficit present.     Mental Status: He is alert. Mental status is at baseline.  Psychiatric:        Mood and Affect: Mood normal.    Arrived for home visit for Rockwell who reports he is feeling okay but had a hypogrlycemic episode Tuesday night around 1830.   He states that around 1800 his sugar was 70 and he was about to get up to eat but he became semi-responsive and his wife called 911. EMS arrived and checked him out finding his sugar to be in the 60's and he was given food and drink to help increase his sugar. EMS stayed on scene until patient's sugar was >80 and he was A&Ox4. Kilo reports feeling "out of it" and having "the shakes" during this time. He has been checking his sugar since then and recent readings as follows-  11/16 0800- 147 11/16 2100- 75   11/15 0835- 120 11/15 1830- 70 increased to 95 after  food/drink (while EMS was on scene)  Today at 1315- 94 (with only morning dose of insulin and breakfast at 0830)  We discussed at length diabetic management and the importance of increased his calorie/protein intake as well as ensuring he is getting three meals daily with snack in between each meal. We also discussed what type of foods to eat for meals and snacks and what to avoid. He was grateful for this discussion. I advised him to monitor his sugar closely. He follows up with Endocrinology on Dec. 5th. He knows to reach out of anything changes or happens again.   I obtained all vitals and assessment. Moo has gained some weight, some mild shortness of breath and notes to have some swelling in the left lower leg. He has 44m Lasix as needed in which I instructed him to take today at 1400.   He sees Dr. MAundra Dubintomorrow.   I reviewed mediations and filled pill box accordingly. I plan to see GTravellein two weeks pending changes from appointment tomorrow.   Refills: Atorvastatin Zetia Warfarin   Metformin Cymbalta Insulin    SOCIAL REMINDER: 11/29 DSS HEARING AT 10:30         Future Appointments  Date Time Provider [place] 03/02/2021  8:40 AM MLarey Dresser MD [facility] None  03/19/2021 11:20 AM GPhilemon Kingdom MD [facility] None  03/20/2021  9:30 AM [facility]  03/28/2021  9:30 AM VTresa Endo RPH-CPP [facility] None  04/20/2021 10:45 AM Kirsteins, ALuanna Salk MD [facility] CPR  05/16/2021  9:30 AM VTresa Endo RPH-CPP [facility] None  06/26/2021  9:30 AM Penumalli, VEarlean Polka MD [facility] None     ACTION: Home visit completed

## 2021-03-02 ENCOUNTER — Other Ambulatory Visit: Payer: Self-pay

## 2021-03-02 ENCOUNTER — Ambulatory Visit (HOSPITAL_COMMUNITY)
Admission: RE | Admit: 2021-03-02 | Discharge: 2021-03-02 | Disposition: A | Discharge status: Home / Self Care | Payer: Medicaid Other | Source: Ambulatory Visit | Attending: Cardiology | Admitting: Cardiology

## 2021-03-02 ENCOUNTER — Other Ambulatory Visit (HOSPITAL_COMMUNITY): Payer: Self-pay | Admitting: *Deleted

## 2021-03-02 ENCOUNTER — Encounter (HOSPITAL_COMMUNITY): Payer: Self-pay | Admitting: Cardiology

## 2021-03-02 ENCOUNTER — Telehealth (HOSPITAL_COMMUNITY): Payer: Self-pay

## 2021-03-02 ENCOUNTER — Telehealth: Payer: Self-pay | Admitting: Internal Medicine

## 2021-03-02 ENCOUNTER — Other Ambulatory Visit (HOSPITAL_COMMUNITY): Payer: Self-pay

## 2021-03-02 VITALS — BP 120/78 | HR 83 | Wt 166.4 lb

## 2021-03-02 DIAGNOSIS — Z79899 Other long term (current) drug therapy: Secondary | ICD-10-CM | POA: Diagnosis not present

## 2021-03-02 DIAGNOSIS — Z8616 Personal history of COVID-19: Secondary | ICD-10-CM | POA: Insufficient documentation

## 2021-03-02 DIAGNOSIS — I255 Ischemic cardiomyopathy: Secondary | ICD-10-CM | POA: Diagnosis not present

## 2021-03-02 DIAGNOSIS — G40909 Epilepsy, unspecified, not intractable, without status epilepticus: Secondary | ICD-10-CM | POA: Diagnosis not present

## 2021-03-02 DIAGNOSIS — I69998 Other sequelae following unspecified cerebrovascular disease: Secondary | ICD-10-CM | POA: Diagnosis not present

## 2021-03-02 DIAGNOSIS — I513 Intracardiac thrombosis, not elsewhere classified: Secondary | ICD-10-CM

## 2021-03-02 DIAGNOSIS — Z596 Low income: Secondary | ICD-10-CM | POA: Insufficient documentation

## 2021-03-02 DIAGNOSIS — Z5982 Transportation insecurity: Secondary | ICD-10-CM | POA: Diagnosis not present

## 2021-03-02 DIAGNOSIS — I110 Hypertensive heart disease with heart failure: Secondary | ICD-10-CM | POA: Diagnosis not present

## 2021-03-02 DIAGNOSIS — E1042 Type 1 diabetes mellitus with diabetic polyneuropathy: Secondary | ICD-10-CM | POA: Insufficient documentation

## 2021-03-02 DIAGNOSIS — Z7901 Long term (current) use of anticoagulants: Secondary | ICD-10-CM | POA: Insufficient documentation

## 2021-03-02 DIAGNOSIS — I5022 Chronic systolic (congestive) heart failure: Secondary | ICD-10-CM | POA: Diagnosis not present

## 2021-03-02 LAB — BASIC METABOLIC PANEL
Anion gap: 6 (ref 5–15)
BUN: 10 mg/dL (ref 6–20)
CO2: 27 mmol/L (ref 22–32)
Calcium: 8.8 mg/dL — ABNORMAL LOW (ref 8.9–10.3)
Chloride: 105 mmol/L (ref 98–111)
Creatinine, Ser: 0.75 mg/dL (ref 0.61–1.24)
GFR, Estimated: >60 mL/min (ref >60)
Glucose, Bld: 192 mg/dL — ABNORMAL HIGH (ref 70–99)
Potassium: 4.1 mmol/L (ref 3.5–5.1)
Sodium: 138 mmol/L (ref 135–145)

## 2021-03-02 LAB — DIGOXIN LEVEL: Digoxin Level: 0.5 ng/mL — ABNORMAL LOW (ref 0.8–2.0)

## 2021-03-02 MED ORDER — FUROSEMIDE 20 MG PO TABS
20.0000 mg | ORAL_TABLET | Freq: Every day | ORAL | 3 refills | Status: DC
  Filled 2021-03-02: qty 30, 30d supply, fill #0

## 2021-03-02 MED ORDER — FUROSEMIDE 20 MG PO TABS
20.0000 mg | ORAL_TABLET | Freq: Every day | ORAL | 3 refills | Status: DC
  Filled 2021-03-02 (×2): qty 30, 30d supply, fill #0
  Filled 2021-04-03: qty 30, 30d supply, fill #1

## 2021-03-02 MED ORDER — SPIRONOLACTONE 25 MG PO TABS
25.0000 mg | ORAL_TABLET | Freq: Every day | ORAL | 3 refills | Status: DC
  Filled 2021-03-02: qty 30, 30d supply, fill #0

## 2021-03-02 MED ORDER — FUROSEMIDE 20 MG PO TABS
20.0000 mg | ORAL_TABLET | ORAL | 3 refills | Status: DC
  Filled 2021-03-02: qty 15, 30d supply, fill #0

## 2021-03-02 NOTE — Progress Notes (Signed)
ReDS Vest / Clip - 03/02/21 0900       ReDS Vest / Clip   Station Marker C    Ruler Value 30    ReDS Value Range Low volume    ReDS Actual Value 33

## 2021-03-02 NOTE — Patient Instructions (Addendum)
Start Furosemide 20mg  Daily.  Increase Spironolactone to 25mg  at bedtime.  Labs done today, your results will be available in MyChart, we will contact you for abnormal readings.  Your physician recommends that you schedule a follow-up appointment in: 49month.  Your physician recommends that you return for lab work in: 10 days.  If you have any questions or concerns before your next appointment please send a message through  or call our office at [phone].    TO LEAVE A MESSAGE FOR THE NURSE SELECT OPTION 2, PLEASE LEAVE A MESSAGE INCLUDING: YOUR NAME DATE OF BIRTH CALL BACK NUMBER REASON FOR CALL**this is important as we prioritize the call backs  YOU WILL RECEIVE A CALL BACK THE SAME DAY AS LONG AS YOU CALL BEFORE 4:00 PM  At the [facility], you and your health needs are our priority. As part of our continuing mission to provide you with exceptional heart care, we have created designated Provider Care Teams. These Care Teams include your primary Cardiologist (physician) and Advanced Practice Providers (APPs- Physician Assistants and Nurse Practitioners) who all work together to provide you with the care you need, when you need it.   You may see any of the following providers on your designated Care Team at your next follow up: Dr [address] [phone], NP Arvilla Meres, Carron Curie [facility] [place], [facility] [place], PharmD   Please be sure to bring in all your medications bottles to every appointment.

## 2021-03-02 NOTE — Telephone Encounter (Signed)
Transportation Ride Home

## 2021-03-02 NOTE — Telephone Encounter (Signed)
Spoke to Mr. Wilkie and advised him the med changes from his visit today and reviewed them so he and his wife could make the appropriate changes.   Lasix 20mg  daily. Spironolactone 25mg  bedtime.  I called in refills as follows:  [facility]- $24 copay -Metformin -Cymbalta -Novolin    [facility]- $0 copay -Atorvastatin -Zetia -Warfarin -Lasix -Spironolactone   I will follow up next week. Call complete.

## 2021-03-04 NOTE — Progress Notes (Signed)
PCP: Dr. Karle Plumber Primary Cardiologist: Dr. Aundra Dubin  HPI: 35 y.o.male with diabetes, HTN, and a long history of nonischemic cardiomyopathy. He had a cardiac MRI in 1/08 showing low EF, but he says that he had been told about "heart problems" even prior to that.  He does not have a family history of cardiomyopathy that he knows of, but does not know his father's family.  Coronary CT angiogram in 2014 showed no CAD. Echo in 2/17 showed EF 20-25%, similar to 2014. Had repeat echo in 2020 showing LVEF 15-20%, RV normal. He had COVID-19 in 4/22.   He presented to [facility] ED 09/11/20 with tingling in left arm, left face, drooling and blank stare for about 1 week, and admitted for possible seizure, HHS and possible CVA.  In ED, he had left-sided neurologic deficit with dysarthria.  CT head with probable subacute infarct in right frontal lobe and old right parietal infarct.  Reportedly, patient's neurologic deficits resolved in ED, and focus shifted to possible seizure versus stroke.  He was loaded with Keppra.  MRI brain with probable hemorrhagic infarct involving the anterior right frontal lobe and left parietal occipital cortex with additional chronic right parietal infarct, right frontotemporal region enhancement suggesting changes related to acute seizure, possible toxic insult involving the caudate and lentiform nuclei bilaterally.  Neurology recommended interval follow-up brain MRI.  CTA head and neck without significant finding.  TTE with EF of 10-15%, LV thrombus, moderate RV dysfunction and dilated IVC.  He was discharged on GDMT, but A1c too high for SGLT2i.    Seen in [facility] ED 09/21/20 for fall after syncopal episode on toilet, hitting his head. CT of the head was significant for no bleeding but new low-density left cerebellum suggesting acute infarct. EEG unremarkable and felt fall secondary to vagal episode not seizure.  He had atypical chest pain at his hospital follow up. With his history of HLD and DM,  decisions was made to undergo [facility] to evaluate coronary anatomy and hemodynamics.  R/LHC (7/22) showed normal filling pressures, preserved cardiac output and no significant CAD. RA mean 1, RV 28/1, PA 19/7 mean 12, PCWP 3, CO/CI 5.54/2.92.  Again admitted 12/14/20-12/16/20 for syncopal episode. CT head negative. He was given IVF, beta blocker/spiro/Entresto held initially. Suspected orthostatic vs micturition syncope. EP consulted with known low EF and plans for ICD implant when a1c better controlled.  CPX in 9/22 showed severe HF limitation. Echo was done today and reviewed, EF < 20% with mild LV dilation, no LV thrombus noted, normal RV size with mildly decreased systolic function, IVC normal.   He returns for followup of CHF.  Weight is up about 10 lbs.  Patient had a recent seizure thought to be related to hypoglycemia (paramedics called, improved with glucose). He does not take Lasix regularly.  He fatigues easily and has mild dyspnea with stairs or long walks.  He generally does ok if he walks slowly.  Toprol XL and spironolactone decreased recently with lightheadedness, now resolved.  No orthopnea/PND.  He has symptoms of peripheral neuropathy related to diabetes.   Labs (9/22): K 3.5, creatinine 0.85, digoxin 0.4 Labs (10/22): K 4.1, creatinine 0.8, digoxin 0.9  REDS clip 33%  ECG (personally reviewed): NSR, LPFB, interolateral TWIs  ROS: All systems negative except as listed in HPI, PMH and Problem List.  PMH:  1. Type 1 diabetes: Generally poorly controlled.  2. 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  3. H/o LV thrombus  4. Syncope: Vagal vs orthostatic, likely not arrhythmic.  5. Diabetic polyneuropathy.  6. Chronic systolic CHF: Nonischemic cardiomyopathy.  Long-standing.  - Cardiac MRI (1/08): EF 45%, no late gadolinium enhancement.  - Coronary CTA (7/14): No significant CAD - Echo (7/14): EF 20-25%, severe LV dilation.  - Echo (6/22): EF  10-15%, LV thrombus, mild LV dilation, moderately decreased RV systolic function with normal size, mild MR, dilated IVC.  - LHC/RHC (7/22): No CAD; mean RA 1, PA 19/7, mean PCWP 3, CI 2.92.  - Echo (10/22): EF < 20%, mild LV dilation, normal RV size with mildly decreased systolic function, IVC normal.  - CPX (9/22): peak VO2 10.4, VE/VCO2 slope 26, RER 1.22 => severe HF limitation.  7. 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   SH:  Social History   Socioeconomic History   Marital status: Married    Spouse name: Not on file   Number of children: Not on file   Years of education: Not on file   Highest education level: Not on file  Occupational History   Occupation: unemployed  Tobacco Use   Smoking status: Never   Smokeless tobacco: Never  Vaping Use   Vaping Use: Not on file  Substance and Sexual Activity   Alcohol use: Not Currently    Comment: "occasional" when "hanging out with the wrong people" No recent use.   Drug use: Not Currently    Types: Marijuana    Comment: occasional, last 2013   Sexual activity: Not Currently  Other Topics Concern   Not on file  Social History Narrative   Pt lives at home with his wife    Right handed    Caffeine- hardly any   Social Determinants of Health   Financial Resource Strain: High Risk   Difficulty of Paying Living Expenses: Hard  Food Insecurity: No Food Insecurity   Worried About Charity fundraiser in the Last Year: Never true   Ran Out of Food in the Last Year: Never true  Transportation Needs: Unmet Transportation Needs   Lack of Transportation (Medical): No   Lack of Transportation (Non-Medical): Yes  Physical Activity: Not on file  Stress: Not on file  Social Connections: Not on file  Intimate Partner Violence: Not on file   FH:  Family History  Problem Relation Age of Onset   Stroke Mother    Diabetes Mother    Hypertension Mother    Stroke Maternal Aunt    Heart attack Neg Hx     Current Outpatient  Medications  Medication Sig Dispense Refill   acetaminophen (TYLENOL) 500 MG tablet Take 1,000 mg by mouth every 8 (eight) hours as needed for mild pain, fever or headache.     atorvastatin (LIPITOR) 80 MG tablet Take 1 tablet (80 mg total) by mouth daily. 30 tablet 2   Blood Glucose Monitoring Suppl (TRUE METRIX METER) w/Device KIT 1 each by Does not apply route 3 (three) times daily. 1 kit 0   Blood Pressure Monitor DEVI Use as directed to check home blood pressure 2-3 times a week 1 Device 0   digoxin (LANOXIN) 0.125 MG tablet Take 1 tablet (0.125 mg total) by mouth daily. 30 tablet 2   DULoxetine (CYMBALTA) 20 MG capsule Take 1 capsule (20 mg total) by mouth daily. 30 capsule 3   ezetimibe (ZETIA) 10 MG tablet Take 1 tablet (10 mg total) by mouth daily. 30 tablet 2   glucose blood (TRUE METRIX BLOOD GLUCOSE TEST) test strip  Use as instructed 100 each 12   ibuprofen (ADVIL) 600 MG tablet Take 600 mg by mouth every 6 (six) hours as needed.     insulin isophane & regular human KwikPen (NOVOLIN 70/30 KWIKPEN) (70-30) 100 UNIT/ML KwikPen 22 units SQ Q a.m and 16 units Q p.m 15 mL 5   Insulin Pen Needle 32G X 4 MM MISC Use in the morning, at noon, and at bedtime. 200 each 0   Insulin Syringe-Needle U-100 (RELION INSULIN SYRINGE) 31G X 15/64" 0.3 ML MISC Use to inject insulin daily. 100 each 11   ivabradine (CORLANOR) 7.5 MG TABS tablet Take 1 tablet (7.5 mg total) by mouth 2 (two) times daily with a meal. 60 tablet    levETIRAcetam (KEPPRA) 750 MG tablet Take 2 tablets (1,500 mg total) by mouth 2 (two) times daily. 120 tablet 3   Menthol, Topical Analgesic, (ICY HOT BACK EX) Apply 1 application topically as needed (neuropathy). Icy hot cream     metFORMIN (GLUCOPHAGE) 500 MG tablet Take 1 tablet (500 mg total) by mouth 2 (two) times daily with a meal. 60 tablet 4   metoprolol (TOPROL-XL) 200 MG 24 hr tablet Take 0.5 tablets (100 mg total) by mouth daily. Take with or immediately following a meal. 30  tablet 2   Oxcarbazepine (TRILEPTAL) 300 MG tablet Take 2 tablets (600 mg total) by mouth 2 (two) times daily. 120 tablet 3   phenytoin (DILANTIN) 100 MG ER capsule Take 1 capsule (100 mg total) by mouth 3 (three) times daily. 90 capsule 5   pregabalin (LYRICA) 100 MG capsule Take 1 capsule (100 mg total) by mouth 3 (three) times daily. 90 capsule 2   sacubitril-valsartan (ENTRESTO) 24-26 MG Take 1 tablet by mouth 2 (two) times daily. 60 tablet 0   TRUEplus Lancets 28G MISC Use as directed 100 each 4   warfarin (COUMADIN) 5 MG tablet Take 1-1.5 tablets (5-7.5 mg total) by mouth daily as directed by Coumadin clinic 40 tablet 1   furosemide (LASIX) 20 MG tablet Take 1 tablet (20 mg total) by mouth daily. 30 tablet 3   spironolactone (ALDACTONE) 25 MG tablet Take 1 tablet (25 mg total) by mouth at bedtime. 90 tablet 3   No current facility-administered medications for this encounter.   BP 120/78   Pulse 83   Wt 75.5 kg (166 lb 6.4 oz)   SpO2 100%   BMI 23.21 kg/m   Wt Readings from Last 3 Encounters:  03/02/21 75.5 kg (166 lb 6.4 oz)  03/01/21 73.5 kg (162 lb)  02/15/21 69.9 kg (154 lb)   PHYSICAL EXAM: General: NAD Neck: JVP 8 cm, no thyromegaly or thyroid nodule.  Lungs: Clear to auscultation bilaterally with normal respiratory effort. CV: Nondisplaced PMI.  Heart regular S1/S2, no S3/S4, no murmur.  Trace ankle edema.  No carotid bruit.  Normal pedal pulses.  Abdomen: Soft, nontender, no hepatosplenomegaly, no distention.  Skin: Intact without lesions or rashes.  Neurologic: Alert and oriented x 3.  Psych: Normal affect. Extremities: No clubbing or cyanosis.  HEENT: Normal.   ASSESSMENT & PLAN:  1.  Syncope: Suspect this was orthostatic vs vagal rather than arrhythmic.  No further events. Will likely be able to tolerate only low-dose Entresto.  - Plan on ICD placement when he has insurance, still working on [employer].  2. Chronic systolic CHF: Nonischemic cardiomyopathy known  for years (since at least 2008).  Cardiac MRI in 2008 with EF 45%, no LGE.  Normal coronary CTA  in 2014.  Echo (7/22) with EF 10-15%, LV thrombus, moderate RV dysfunction, dilated IVC. R/LHC (6/22) showed normal filling pressures, preserved cardiac output and no significant CAD. CPX in 9/22 showed severe HF limitation concerning for advanced HF.  Echo in 9/22 showed EF < 20% with mild LV dilation, no LV thrombus noted, normal RV size with mildly decreased systolic function, IVC normal.  NYHA class III symptoms chronically, mild volume overload on exam with weight up though REDS clip only 33%.   - Increase spironolactone back to 25 mg daily (take at bedtime).  BMET today and again in 10 days.  - Continue Entresto 24/26 mg bid (will not titrate further with soft BP) - Continue Toprol XL 100 mg daily (decreased with lightheadedness). - Continue Ivabradine 7.5 mg bid.  - Continue digoxin 0.125. Check dig level today. - Start Lasix 20 mg every other day.  - Hold off on SGLT2i with type 1 diabetes on significant insulin - Though cardiac output was preserved on 6/22 RHC, CPX showed a severe HF limitation concerning for advanced HF.  I have referred to Dr. Mosetta Pigeon at [facility] for a transplant evaluation.  He will need to get Medicaid (social work is helping him with this).  - EP planning on ICD when he has Medicaid.  Narrow QRS, so not CRT candidate.  3. Diabetes: Insulin-dependent, started in his 85s.  Control better recently but has been poor in past.  - He has an appt with endocrinology. Would he be a candidate for SGLT2 inhibitor?  4. 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  - On keppra, dilantin 5. 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   - On coumadin and statin.  6. LV Thrombus: with associated CVA. Most recent echo in 9/22 did not show a thrombus.  - On coumadin.   Loralie Champagne 03/04/2021

## 2021-03-13 ENCOUNTER — Ambulatory Visit (HOSPITAL_COMMUNITY)
Admission: RE | Admit: 2021-03-13 | Discharge: 2021-03-13 | Disposition: A | Discharge status: Home / Self Care | Payer: Medicaid Other | Source: Ambulatory Visit | Attending: Cardiology | Admitting: Cardiology

## 2021-03-13 DIAGNOSIS — I255 Ischemic cardiomyopathy: Secondary | ICD-10-CM | POA: Insufficient documentation

## 2021-03-13 LAB — BASIC METABOLIC PANEL
Anion gap: 8 (ref 5–15)
BUN: 14 mg/dL (ref 6–20)
CO2: 30 mmol/L (ref 22–32)
Calcium: 8.9 mg/dL (ref 8.9–10.3)
Chloride: 102 mmol/L (ref 98–111)
Creatinine, Ser: 0.84 mg/dL (ref 0.61–1.24)
GFR, Estimated: >60 mL/min (ref >60)
Glucose, Bld: 151 mg/dL — ABNORMAL HIGH (ref 70–99)
Potassium: 4.1 mmol/L (ref 3.5–5.1)
Sodium: 140 mmol/L (ref 135–145)

## 2021-03-15 ENCOUNTER — Other Ambulatory Visit (HOSPITAL_COMMUNITY): Payer: Self-pay

## 2021-03-15 ENCOUNTER — Telehealth: Payer: Self-pay | Admitting: Licensed Clinical Social Worker

## 2021-03-15 NOTE — Telephone Encounter (Signed)
Rides scheduled for pt to the following appts: 12/5 to establish with Dr. Elvera Lennox w/ endocrinology, appt is at 11:15am and ride pick up tentatively scheduled for 10:36am.  12/6 to see coumadin clinic at [facility]. Office, appt is at 9:30am and ride pick up tentatively scheduled for 8:51am.   Our team remains available for any additional coordination as needed.  Octavio Graves, MSW, LCSW Clinical Social Worker II [facility] Navigation  [phone]- work cell phone (preferred) [phone]- desk phone

## 2021-03-15 NOTE — Progress Notes (Signed)
Paramedicine Encounter    Patient ID: Jason Stevenson, male    DOB: 09/01/1985, 35 y.o.   MRN: [id number]   Patient Care Team: Ladell Pier, MD as PCP - General (Internal Medicine) Josue Hector, MD as PCP - Cardiology (Cardiology) Vickie Epley, MD as PCP - Electrophysiology (Cardiology)  Patient Active Problem List   Diagnosis Date Noted   Ischemic cardiomyopathy    Syncope 12/14/2020   Chest pain 10/30/2020   History of cerebrovascular accident (CVA) with residual deficit 10/27/2020   Protein-calorie malnutrition, severe 09/22/2020   Acute CVA (cerebrovascular accident) ([place]) 09/21/2020   Near syncope 09/21/2020   Encounter for monitoring Coumadin therapy 09/20/2020   LV (left ventricular) mural thrombus 09/13/2020   Seizure ([place]) 09/13/2020   Neurological deficit present 09/12/2020   History of COVID-19 09/12/2020   Intracerebral hemorrhage 09/12/2020   Acute cerebrovascular accident (CVA) ([place]) 09/12/2020   Hyperosmolar hyperglycemic state (HHS) ([place]) 09/11/2020   COVID-19 08/23/2020   Noncompliance with medication treatment due to intermittent use of medication 10/12/2018   Dyslipidemia 04/17/2016   Uncontrolled type 2 diabetes mellitus without complication, with long-term current use of insulin 19/62/2297   Chronic systolic CHF (congestive heart failure) ([place]) 06/18/2015   Needs flu shot 12/23/2013   Type 2 diabetes mellitus ([place]) 11/18/2013   Diabetes ([place]) 06/17/2013   Non-ischemic cardiomyopathy ([place]) 11/06/2012   HTN (hypertension) 11/06/2012   HLD (hyperlipidemia) 11/06/2012    Current Outpatient Medications:    acetaminophen (TYLENOL) 500 MG tablet, Take 1,000 mg by mouth every 8 (eight) hours as needed for mild pain, fever or headache., Disp: , Rfl:    atorvastatin (LIPITOR) 80 MG tablet, Take 1 tablet (80 mg total) by mouth daily., Disp: 30 tablet, Rfl: 2   Blood Glucose Monitoring Suppl (TRUE METRIX METER) w/Device KIT, 1 each by Does not apply  route 3 (three) times daily., Disp: 1 kit, Rfl: 0   Blood Pressure Monitor DEVI, Use as directed to check home blood pressure 2-3 times a week, Disp: 1 Device, Rfl: 0   digoxin (LANOXIN) 0.125 MG tablet, Take 1 tablet (0.125 mg total) by mouth daily., Disp: 30 tablet, Rfl: 2   DULoxetine (CYMBALTA) 20 MG capsule, Take 1 capsule (20 mg total) by mouth daily., Disp: 30 capsule, Rfl: 3   ezetimibe (ZETIA) 10 MG tablet, Take 1 tablet (10 mg total) by mouth daily., Disp: 30 tablet, Rfl: 2   furosemide (LASIX) 20 MG tablet, Take 1 tablet (20 mg total) by mouth daily., Disp: 30 tablet, Rfl: 3   glucose blood (TRUE METRIX BLOOD GLUCOSE TEST) test strip, Use as instructed, Disp: 100 each, Rfl: 12   ibuprofen (ADVIL) 600 MG tablet, Take 600 mg by mouth every 6 (six) hours as needed., Disp: , Rfl:    insulin isophane & regular human KwikPen (NOVOLIN 70/30 KWIKPEN) (70-30) 100 UNIT/ML KwikPen, 22 units SQ Q a.m and 16 units Q p.m, Disp: 15 mL, Rfl: 5   Insulin Pen Needle 32G X 4 MM MISC, Use in the morning, at noon, and at bedtime., Disp: 200 each, Rfl: 0   Insulin Syringe-Needle U-100 (RELION INSULIN SYRINGE) 31G X 15/64" 0.3 ML MISC, Use to inject insulin daily., Disp: 100 each, Rfl: 11   ivabradine (CORLANOR) 7.5 MG TABS tablet, Take 1 tablet (7.5 mg total) by mouth 2 (two) times daily with a meal., Disp: 60 tablet, Rfl:    levETIRAcetam (KEPPRA) 750 MG tablet, Take 2 tablets (1,500 mg total) by mouth 2 (two) times  daily., Disp: 120 tablet, Rfl: 3   Menthol, Topical Analgesic, (ICY HOT BACK EX), Apply 1 application topically as needed (neuropathy). Icy hot cream, Disp: , Rfl:    metFORMIN (GLUCOPHAGE) 500 MG tablet, Take 1 tablet (500 mg total) by mouth 2 (two) times daily with a meal., Disp: 60 tablet, Rfl: 4   metoprolol (TOPROL-XL) 200 MG 24 hr tablet, Take 0.5 tablets (100 mg total) by mouth daily. Take with or immediately following a meal., Disp: 30 tablet, Rfl: 2   Oxcarbazepine (TRILEPTAL) 300 MG  tablet, Take 2 tablets (600 mg total) by mouth 2 (two) times daily., Disp: 120 tablet, Rfl: 3   phenytoin (DILANTIN) 100 MG ER capsule, Take 1 capsule (100 mg total) by mouth 3 (three) times daily., Disp: 90 capsule, Rfl: 5   pregabalin (LYRICA) 100 MG capsule, Take 1 capsule (100 mg total) by mouth 3 (three) times daily., Disp: 90 capsule, Rfl: 2   sacubitril-valsartan (ENTRESTO) 24-26 MG, Take 1 tablet by mouth 2 (two) times daily., Disp: 60 tablet, Rfl: 0   spironolactone (ALDACTONE) 25 MG tablet, Take 1 tablet (25 mg total) by mouth at bedtime., Disp: 90 tablet, Rfl: 3   TRUEplus Lancets 28G MISC, Use as directed, Disp: 100 each, Rfl: 4   warfarin (COUMADIN) 5 MG tablet, Take 1-1.5 tablets (5-7.5 mg total) by mouth daily as directed by Coumadin clinic, Disp: 40 tablet, Rfl: 1 No Known Allergies   Social History   Socioeconomic History   Marital status: Married    Spouse name: Not on file   Number of children: Not on file   Years of education: Not on file   Highest education level: Not on file  Occupational History   Occupation: unemployed  Tobacco Use   Smoking status: Never   Smokeless tobacco: Never  Vaping Use   Vaping Use: Not on file  Substance and Sexual Activity   Alcohol use: Not Currently    Comment: "occasional" when "hanging out with the wrong people" No recent use.   Drug use: Not Currently    Types: Marijuana    Comment: occasional, last 2013   Sexual activity: Not Currently  Other Topics Concern   Not on file  Social History Narrative   Pt lives at home with his wife    Right handed    Caffeine- hardly any   Social Determinants of Health   Financial Resource Strain: High Risk   Difficulty of Paying Living Expenses: Hard  Food Insecurity: No Food Insecurity   Worried About Charity fundraiser in the Last Year: Never true   Ran Out of Food in the Last Year: Never true  Transportation Needs: Unmet Transportation Needs   Lack of Transportation (Medical):  No   Lack of Transportation (Non-Medical): Yes  Physical Activity: Not on file  Stress: Not on file  Social Connections: Not on file  Intimate Partner Violence: Not on file    Physical Exam Vitals reviewed.  Constitutional:      Appearance: Normal appearance. He is normal weight.  HENT:     Head: Normocephalic.     Nose: Nose normal.     Mouth/Throat:     Mouth: Mucous membranes are moist.     Pharynx: Oropharynx is clear.  Eyes:     Conjunctiva/sclera: Conjunctivae normal.     Pupils: Pupils are equal, round, and reactive to light.  Cardiovascular:     Rate and Rhythm: Normal rate and regular rhythm.     Pulses:  Normal pulses.     Heart sounds: Normal heart sounds.  Pulmonary:     Effort: Pulmonary effort is normal.     Breath sounds: Normal breath sounds.  Abdominal:     General: Abdomen is flat.     Palpations: Abdomen is soft.  Musculoskeletal:        General: No swelling. Normal range of motion.     Cervical back: Normal range of motion.     Right lower leg: No edema.     Left lower leg: No edema.  Skin:    General: Skin is warm and dry.     Capillary Refill: Capillary refill takes less than 2 seconds.  Neurological:     General: No focal deficit present.     Mental Status: He is alert. Mental status is at baseline.  Psychiatric:        Mood and Affect: Mood normal.    Arrived for home visit for [place] who reports feeling well this week and denied any dizziness, chest pain or shortness of breath. He states he continues to have neuropathy in his hands and feet. He is med complaint. He has been monitoring his vitals at home and his blood pressures and blood sugars are maintained. I obtained vitals and recorded same. No swelling, edema or JVD noted. Weight stable.    [name] and I reviewed upcoming appointments and confirmed same and set up rides with LCSW for [facility].   Macky received a letter stating his appeal was granted for reversal so he will be  getting disability and medicaid soon. We will continue to monitor this situation.   Home visit complete. I plan to see Kristofer in one week.   Refills: -Pregabalin -Dilantin -Keppra -Digoxin -Trilleptal     Future Appointments  Date Time Provider [place]  03/19/2021 11:20 AM Philemon Kingdom, MD [facility] None  03/20/2021  9:30 AM [facility]  03/28/2021  9:30 AM Tresa Endo, RPH-CPP [facility] None  04/03/2021  9:00 AM [facility] PA/NP [facility] None  04/20/2021 10:45 AM Kirsteins, Luanna Salk, MD [facility]  05/16/2021  9:30 AM Tresa Endo, RPH-CPP [facility] None  06/26/2021  9:30 AM Penumalli, Earlean Polka, MD [facility] None     ACTION: Home visit completed

## 2021-03-16 ENCOUNTER — Other Ambulatory Visit: Payer: Self-pay

## 2021-03-16 ENCOUNTER — Other Ambulatory Visit (HOSPITAL_COMMUNITY): Payer: Self-pay

## 2021-03-19 ENCOUNTER — Encounter: Payer: Self-pay | Admitting: Internal Medicine

## 2021-03-19 ENCOUNTER — Other Ambulatory Visit: Payer: Self-pay

## 2021-03-19 ENCOUNTER — Ambulatory Visit (INDEPENDENT_AMBULATORY_CARE_PROVIDER_SITE_OTHER): Payer: Medicaid Other | Admitting: Internal Medicine

## 2021-03-19 VITALS — BP 100/60 | HR 89 | Ht 71.0 in | Wt 163.2 lb

## 2021-03-19 DIAGNOSIS — E1165 Type 2 diabetes mellitus with hyperglycemia: Secondary | ICD-10-CM

## 2021-03-19 DIAGNOSIS — E1159 Type 2 diabetes mellitus with other circulatory complications: Secondary | ICD-10-CM | POA: Diagnosis not present

## 2021-03-19 MED ORDER — DAPAGLIFLOZIN PROPANEDIOL 5 MG PO TABS
5.0000 mg | ORAL_TABLET | Freq: Every day | ORAL | 1 refills | Status: DC
  Filled 2021-03-19: qty 30, 30d supply, fill #0

## 2021-03-19 NOTE — Progress Notes (Signed)
Patient ID: Jason Stevenson, male   DOB: 1985/07/10, 35 y.o.   MRN: [id number]  This visit occurred during the SARS-CoV-2 public health emergency.  Safety protocols were in place, including screening questions prior to the visit, additional usage of staff PPE, and extensive cleaning of exam room while observing appropriate contact time as indicated for disinfecting solutions.   HPI: Jason Stevenson is a 35 y.o.-year-old male, referred by his cardiologist, Dr. Aundra Dubin, for management of DM2, dx in 2011, insulin-dependent, uncontrolled, with complications (ischemic cardiomyopathy complicated by systolic CHF, history of CVA -after Covid19 infection, peripheral neuropathy).  Reviewed HbA1c: Lab Results  Component Value Date   HGBA1C 8.2 (H) 01/29/2021   HGBA1C 8.3 (H) 12/14/2020   HGBA1C >15.5 (H) 09/12/2020   HGBA1C >15.5 (H) 11/26/2019   HGBA1C 14.2 (A) 04/26/2019   HGBA1C 13.8 (A) 01/18/2019   HGBA1C 13.7 (A) 10/12/2018   HGBA1C 12.4 (H) 07/02/2018   HGBA1C 11.6 08/12/2017   HGBA1C 9.1 05/14/2017   Pt is on a regimen of: - Metformin 500 mg 2x a day, with meals - Novolin 70/30  22 units before breakfast 16 units before, during dinner, or at bedtime  Pt checks his sugars 2x a day and they are-Per review of his detailed log and: - am: 68-192, 212 (no insulin) - 2h after b'fast: 302 in the office - before lunch: 95-141 - 2h after lunch: 241 - before dinner: 57, 331 - 2h after dinner: n/c - bedtime: 187-312 - nighttime: n/c Lowest sugar was 51; he has hypoglycemia awareness at 70.  Highest sugar was 321.  Glucometer:TrueMetrix  Pt's meals are: - Breakfast: fruit (including banana), PB, toast, pancakes - Lunch: pasta, stir-fry with protein, sandwiches - Dinner: varies - Snacks: fruit, sometimes chips  - no CKD, last BUN/creatinine:  Lab Results  Component Value Date   BUN 14 03/13/2021   BUN 10 03/02/2021   CREATININE 0.84 03/13/2021   CREATININE 0.75 03/02/2021  On  Entresto 24/26.  -+ HL; last set of lipids: Lab Results  Component Value Date   CHOL 153 12/04/2020   HDL 49 12/04/2020   LDLCALC 81 12/04/2020   TRIG 115 12/04/2020   CHOLHDL 3.1 12/04/2020  On atorvastatin 80 mg daily, ezetimibe 10 mg daily.  - last eye exam was in 2018. No DR.   - + numbness and tingling in his feet. He was currently referred to a pain clinic and also started on Lyrica, Cymbalta.  Pt has FH of DM in mother and sister.  He also has HTN.  ROS: Constitutional: no weight gain, + weight loss, no fatigue, no subjective hyperthermia, no subjective hypothermia, no nocturia Eyes: no blurry vision, no xerophthalmia ENT: no sore throat, no nodules palpated in neck, no dysphagia, no odynophagia, no hoarseness, no tinnitus, no hypoacusis Cardiovascular: no CP, no SOB, no palpitations, no leg swelling Respiratory: no cough, no SOB, no wheezing Gastrointestinal: no N, no V, no D, no C, no acid reflux Musculoskeletal: no muscle, no joint aches Skin: no rash, no hair loss Neurological: no tremors, no numbness or tingling/no dizziness/no HAs Psychiatric: + depression, no anxiety  Past Medical History:  Diagnosis Date   Chronic systolic CHF (congestive heart failure) ([place]) 06/18/2015   Diabetes mellitus without complication ([place])    Hemorrhagic stroke ([place])    Hyperlipidemia    Hypertension    Nonischemic cardiomyopathy ([place]) Noted as early as 2007   Per chart review (cards consult note 2011), EF of 40% in 2007, down to 20-25%  in 2011   Past Surgical History:  Procedure Laterality Date   None     RIGHT/LEFT HEART CATH AND CORONARY ANGIOGRAPHY N/A 11/02/2020   Procedure: RIGHT/LEFT HEART CATH AND CORONARY ANGIOGRAPHY;  Surgeon: Larey Dresser, MD;  Location: [facility];  Service: Cardiovascular;  Laterality: N/A;   Social History   Socioeconomic History   Marital status: Married    Spouse name: Not on file   Number of children: Not on file   Years of  education: Not on file   Highest education level: Not on file  Occupational History   Occupation: unemployed  Tobacco Use   Smoking status: Never   Smokeless tobacco: Never  Vaping Use   Vaping Use: Not on file  Substance and Sexual Activity   Alcohol use: Not Currently    Comment: "occasional" when "hanging out with the wrong people" No recent use.   Drug use: Not Currently    Types: Marijuana    Comment: occasional, last 2013   Sexual activity: Not Currently  Other Topics Concern   Not on file  Social History Narrative   Pt lives at home with his wife    Right handed    Caffeine- hardly any   Social Determinants of Health   Financial Resource Strain: High Risk   Difficulty of Paying Living Expenses: Hard  Food Insecurity: No Food Insecurity   Worried About Charity fundraiser in the Last Year: Never true   Ran Out of Food in the Last Year: Never true  Transportation Needs: Unmet Transportation Needs   Lack of Transportation (Medical): No   Lack of Transportation (Non-Medical): Yes  Physical Activity: Not on file  Stress: Not on file  Social Connections: Not on file  Intimate Partner Violence: Not on file   Current Outpatient Medications on File Prior to Visit  Medication Sig Dispense Refill   acetaminophen (TYLENOL) 500 MG tablet Take 1,000 mg by mouth every 8 (eight) hours as needed for mild pain, fever or headache.     atorvastatin (LIPITOR) 80 MG tablet Take 1 tablet (80 mg total) by mouth daily. 30 tablet 2   Blood Glucose Monitoring Suppl (TRUE METRIX METER) w/Device KIT 1 each by Does not apply route 3 (three) times daily. 1 kit 0   Blood Pressure Monitor DEVI Use as directed to check home blood pressure 2-3 times a week 1 Device 0   digoxin (LANOXIN) 0.125 MG tablet Take 1 tablet (0.125 mg total) by mouth daily. 30 tablet 2   DULoxetine (CYMBALTA) 20 MG capsule Take 1 capsule (20 mg total) by mouth daily. 30 capsule 3   ezetimibe (ZETIA) 10 MG tablet Take 1  tablet (10 mg total) by mouth daily. 30 tablet 2   furosemide (LASIX) 20 MG tablet Take 1 tablet (20 mg total) by mouth daily. 30 tablet 3   glucose blood (TRUE METRIX BLOOD GLUCOSE TEST) test strip Use as instructed 100 each 12   ibuprofen (ADVIL) 600 MG tablet Take 600 mg by mouth every 6 (six) hours as needed.     insulin isophane & regular human KwikPen (NOVOLIN 70/30 KWIKPEN) (70-30) 100 UNIT/ML KwikPen 22 units SQ Q a.m and 16 units Q p.m 15 mL 5   Insulin Pen Needle 32G X 4 MM MISC Use in the morning, at noon, and at bedtime. 200 each 0   Insulin Syringe-Needle U-100 (RELION INSULIN SYRINGE) 31G X 15/64" 0.3 ML MISC Use to inject insulin daily. 100 each  11   ivabradine (CORLANOR) 7.5 MG TABS tablet Take 1 tablet (7.5 mg total) by mouth 2 (two) times daily with a meal. 60 tablet    levETIRAcetam (KEPPRA) 750 MG tablet Take 2 tablets (1,500 mg total) by mouth 2 (two) times daily. 120 tablet 3   Menthol, Topical Analgesic, (ICY HOT BACK EX) Apply 1 application topically as needed (neuropathy). Icy hot cream     metFORMIN (GLUCOPHAGE) 500 MG tablet Take 1 tablet (500 mg total) by mouth 2 (two) times daily with a meal. 60 tablet 4   metoprolol (TOPROL-XL) 200 MG 24 hr tablet Take 0.5 tablets (100 mg total) by mouth daily. Take with or immediately following a meal. 30 tablet 2   Oxcarbazepine (TRILEPTAL) 300 MG tablet Take 2 tablets (600 mg total) by mouth 2 (two) times daily. 120 tablet 3   phenytoin (DILANTIN) 100 MG ER capsule Take 1 capsule (100 mg total) by mouth 3 (three) times daily. 90 capsule 5   pregabalin (LYRICA) 100 MG capsule Take 1 capsule (100 mg total) by mouth 3 (three) times daily. 90 capsule 2   sacubitril-valsartan (ENTRESTO) 24-26 MG Take 1 tablet by mouth 2 (two) times daily. 60 tablet 0   spironolactone (ALDACTONE) 25 MG tablet Take 1 tablet (25 mg total) by mouth at bedtime. 90 tablet 3   TRUEplus Lancets 28G MISC Use as directed 100 each 4   warfarin (COUMADIN) 5 MG  tablet Take 1-1.5 tablets (5-7.5 mg total) by mouth daily as directed by Coumadin clinic 40 tablet 1   No current facility-administered medications on file prior to visit.   No Known Allergies Family History  Problem Relation Age of Onset   Stroke Mother    Diabetes Mother    Hypertension Mother    Stroke Maternal Aunt    Heart attack Neg Hx    PE: BP 100/60 (BP Location: Left Arm, Patient Position: Sitting, Cuff Size: Normal)   Pulse 89   Ht _0  (1.803 m)   Wt 163 lb 3.2 oz (74 kg)   SpO2 98%   BMI 22.76 kg/m  Wt Readings from Last 3 Encounters:  03/19/21 163 lb 3.2 oz (74 kg)  03/15/21 155 lb 6.4 oz (70.5 kg)  03/02/21 166 lb 6.4 oz (75.5 kg)   Constitutional: normal weight, in NAD Eyes: PERRLA, EOMI, no exophthalmos ENT: moist mucous membranes, no thyromegaly, no cervical lymphadenopathy Cardiovascular: RRR, No MRG Respiratory: CTA B Gastrointestinal: abdomen soft, NT, ND, BS+ Musculoskeletal: no deformities, strength intact in all 4 Skin: moist, warm, no rashes Neurological: no tremor with outstretched hands, DTR normal in all 4  ASSESSMENT: 1. DM2, insulin-dependent, uncontrolled, with complications - iCMP, systolic CHF, with LV thrombus - on Coumadin - h/o CVA 08/2020 - PN  PLAN:  1. Patient with long-standing, uncontrolled diabetes, on oral antidiabetic regimen with metformin and also premixed insulin regimen, which is insufficient.  Latest HbA1c was improved to 8.2%,  previously undetectably high in 08/2020 at the time of his stroke.  He mentions that before his stroke he was not compliant with his diabetes regimen.  Afterwards, he has made an effort to take medications consistently and also check blood sugars and write them down.  He does bring a good blood sugar log with him which we reviewed together.  He checks his CBGs approximately twice a day and enters comments and insulin doses into his log.  He is doing a great job with this! -At this visit, we  discussed about  the fact that we need to check him for type 1 diabetes.  He lost a significant amount of weight lately but this could have been due to uncontrolled diabetes with glucotoxicity.  He gained 8 pounds in the last month.  Unfortunately, we could checked his insulin production at this visit since his blood sugar during the appointment (checked with his glucometer) was 307.  For now, we will continue insulin.  He mentions that other insulins were not affordable in the past.  However, he now was again approved for Medicaid (he thinks) and I am wondering whether we can switch him from the premixed insulin regimen which is not very flexible to a basal-bolus insulin regimen.  I do plan to see him back in a month and at that time, if his sugars are lower than 180, we can check his insulin production and we can attempt to switch him to a basal-bolus insulin regimen (after the first of the year).  Until then, we discussed about always taking the Novolin 70/30 30 minutes before meals and I also gave him a more flexible regimen and advised him how to guide the doses based on the meals that he is planning to eat.  For now, we will continue metformin.  Due to his CHF, we will try to start a low-dose SGLT2 inhibitor.  I am hoping that this is covered by his insurance. - I suggested to:  Patient Instructions  Please continue:  - Metformin 500 mg 2x a day, with meals  Please change: - Novolin 70/30 30 min before a meal 22-25 units before breakfast 14-16 units before dinner   Please try to start: - Farxiga 5 mg before b'fast  The most common suppliers for the continuous glucose monitor (Freestyle 2 CGM or Dexcom CGM) are:  [place] [employer]: [place]: [phone] Ext [phone] [employer]: [place]: [place]: [phone] [employer]: [phone]  - advised him to start checking sugars at different times of the day -  check 4x a day, rotating checks -I recommended a CGM and given instructions about getting in touch with the most common suppliers. - discussed about CBG targets for treatment: 80-130 mg/dL before meals and <180 mg/dL after meals; target HbA1c <7%. - given sugar log and advised how to fill it and to bring it at next appt in case he does not get the CGM, which we would be able to download - given foot care handout and explained the principles  - given instructions for hypoglycemia management "15-15 rule".  He has glucose tablets. - Return to clinic in 1 month   Philemon Kingdom, MD PhD [facility] Endocrinology

## 2021-03-19 NOTE — Patient Instructions (Addendum)
Please continue:  - Metformin 500 mg 2x a day, with meals  Please change: - Novolin 70/30 30 min before a meal 22-25 units before breakfast 14-16 units before dinner   Please try to start: - Farxiga 5 mg before b'fast  The most common suppliers for the continuous glucose monitor (Freestyle 2 CGM or Dexcom CGM) are:  [place] [employer]: [phone] [facility]: [phone] Ext [phone] [employer]: [phone] [employer]: [phone] [facility]: [phone] [employer]: [phone]   PATIENT INSTRUCTIONS FOR TYPE 2 DIABETES:  DIET AND EXERCISE Diet and exercise is an important part of diabetic treatment.  We recommended aerobic exercise in the form of brisk walking (working between 40-60% of maximal aerobic capacity, similar to brisk walking) for 150 minutes per week (such as 30 minutes five days per week) along with 3 times per week performing 'resistance' training (using various gauge rubber tubes with handles) 5-10 exercises involving the major muscle groups (upper body, lower body and core) performing 10-15 repetitions (or near fatigue) each exercise. Start at half the above goal but build slowly to reach the above goals. If limited by weight, joint pain, or disability, we recommend daily walking in a swimming pool with water up to waist to reduce pressure from joints while allow for adequate exercise.    BLOOD GLUCOSES Monitoring your blood glucoses is important for continued management of your diabetes. Please check your blood glucoses 2-4 times a day: fasting, before meals and at bedtime (you can rotate these measurements - e.g. one day check before the 3 meals, the next day check before 2 of the meals and before bedtime, etc.).   HYPOGLYCEMIA (low blood sugar) Hypoglycemia is usually a reaction to not eating, exercising, or taking too much insulin/ other diabetes drugs.  Symptoms include tremors, sweating, hunger, confusion, headache,  etc. Treat IMMEDIATELY with 15 grams of Carbs: 4 glucose tablets  cup regular juice/soda 2 tablespoons raisins 4 teaspoons sugar 1 tablespoon honey Recheck blood glucose in 15 mins and repeat above if still symptomatic/blood glucose <100.  RECOMMENDATIONS TO REDUCE YOUR RISK OF DIABETIC COMPLICATIONS: * Take your prescribed MEDICATION(S) * Follow a DIABETIC diet: Complex carbs, fiber rich foods, (monounsaturated and polyunsaturated) fats * AVOID saturated/trans fats, high fat foods, >2,300 mg salt per day. * EXERCISE at least 5 times a week for 30 minutes or preferably daily.  * DO NOT SMOKE OR DRINK more than 1 drink a day. * Check your FEET every day. Do not wear tightfitting shoes. Contact us if you develop an ulcer * See your EYE doctor once a year or more if needed * Get a FLU shot once a year * Get a PNEUMONIA vaccine once before and once after age 28 years  GOALS:  * Your Hemoglobin A1c of <7%  * fasting sugars need to be <130 * after meals sugars need to be <180 (2h after you start eating) * Your Systolic BP should be 140 or lower  * Your Diastolic BP should be 80 or lower  * Your HDL (Good Cholesterol) should be 40 or higher  * Your LDL (Bad Cholesterol) should be 100 or lower. * Your Triglycerides should be 150 or lower  * Your Urine microalbumin (kidney function) should be <30 * Your Body Mass Index should be 25 or lower    Please consider the following ways to cut down carbs and fat and increase fiber and micronutrients in your diet: - substitute whole grain for white bread or  pasta - substitute brown rice for white rice - substitute 90-calorie flat bread pieces for slices of bread when possible - substitute sweet potatoes or yams for white potatoes - substitute humus for margarine - substitute tofu for cheese when possible - substitute almond or rice milk for regular milk (would not drink soy milk daily due to concern for soy estrogen influence on breast cancer  risk) - substitute dark chocolate for other sweets when possible - substitute water - can add lemon or orange slices for taste - for diet sodas (artificial sweeteners will trick your body that you can eat sweets without getting calories and will lead you to overeating and weight gain in the long run) - do not skip breakfast or other meals (this will slow down the metabolism and will result in more weight gain over time)  - can try smoothies made from fruit and almond/rice milk in am instead of regular breakfast - can also try old-fashioned (not instant) oatmeal made with almond/rice milk in am - order the dressing on the side when eating salad at a restaurant (pour less than half of the dressing on the salad) - eat as little meat as possible - can try juicing, but should not forget that juicing will get rid of the fiber, so would alternate with eating raw veg./fruits or drinking smoothies - use as little oil as possible, even when using olive oil - can dress a salad with a mix of balsamic vinegar and lemon juice, for e.g. - use agave nectar, stevia sugar, or regular sugar rather than artificial sweateners - steam or broil/roast veggies  - snack on veggies/fruit/nuts (unsalted, preferably) when possible, rather than processed foods - reduce or eliminate aspartame in diet (it is in diet sodas, chewing gum, etc) Read the labels!  Try to read Dr. Katherina Right book: "Program for Reversing Diabetes" for other ideas for healthy eating.

## 2021-03-20 ENCOUNTER — Telehealth: Payer: Self-pay | Admitting: Pharmacist

## 2021-03-20 ENCOUNTER — Other Ambulatory Visit: Payer: Self-pay

## 2021-03-20 ENCOUNTER — Ambulatory Visit (INDEPENDENT_AMBULATORY_CARE_PROVIDER_SITE_OTHER): Payer: Medicaid Other | Admitting: *Deleted

## 2021-03-20 DIAGNOSIS — Z5181 Encounter for therapeutic drug level monitoring: Secondary | ICD-10-CM

## 2021-03-20 DIAGNOSIS — I639 Cerebral infarction, unspecified: Secondary | ICD-10-CM

## 2021-03-20 DIAGNOSIS — Z7901 Long term (current) use of anticoagulants: Secondary | ICD-10-CM | POA: Diagnosis not present

## 2021-03-20 DIAGNOSIS — I513 Intracardiac thrombosis, not elsewhere classified: Secondary | ICD-10-CM | POA: Diagnosis not present

## 2021-03-20 LAB — POCT INR: INR: 1.4 — AB (ref 2.0–3.0)

## 2021-03-20 NOTE — Patient Instructions (Addendum)
You have 12 hours to take a late warfarin dose.  Description   Today take 2 tablets and tomorrow take 1.5 tablets then continue taking 1 tablet daily except for 1.5 tablets on Tuesdays and Saturdays. Keep 2 leafy veggies in your diet weekly. Recheck in 1 week. Coumadin Clinic [phone].

## 2021-03-20 NOTE — Telephone Encounter (Signed)
I spoke to patient while he was here seeing coumadin clinic. Advised he needs to resume Repatha per Dr. Shirlee Latch. He thought someone told him not to take. He thinks he has some in the fridge.  I added it to his med list.  He will need to re-apply for patient assistance. I will ask Herbert Seta from paramedicine if she can assist patient with this.

## 2021-03-22 ENCOUNTER — Telehealth (HOSPITAL_COMMUNITY): Payer: Self-pay

## 2021-03-22 ENCOUNTER — Other Ambulatory Visit (HOSPITAL_COMMUNITY): Payer: Self-pay

## 2021-03-22 ENCOUNTER — Other Ambulatory Visit: Payer: Self-pay

## 2021-03-22 ENCOUNTER — Other Ambulatory Visit: Payer: Self-pay | Admitting: Internal Medicine

## 2021-03-22 DIAGNOSIS — E1142 Type 2 diabetes mellitus with diabetic polyneuropathy: Secondary | ICD-10-CM

## 2021-03-22 MED ORDER — METFORMIN HCL 500 MG PO TABS
500.0000 mg | ORAL_TABLET | Freq: Two times a day (BID) | ORAL | 2 refills | Status: DC
  Filled 2021-03-22 – 2021-04-05 (×3): qty 60, 30d supply, fill #0

## 2021-03-22 NOTE — Telephone Encounter (Signed)
Requested Prescriptions  Pending Prescriptions Disp Refills  . metFORMIN (GLUCOPHAGE) 500 MG tablet 60 tablet 2    Sig: Take 1 tablet (500 mg total) by mouth 2 (two) times daily with a meal.     Endocrinology:  Diabetes - Biguanides Failed - 03/22/2021  3:59 PM      Failed - HBA1C is between 0 and 7.9 and within 180 days    HbA1c, POC (controlled diabetic range)  Date Value Ref Range Status  04/26/2019 14.2 (A) 0.0 - 7.0 % Final   Hgb A1c MFr Bld  Date Value Ref Range Status  01/29/2021 8.2 (H) 4.8 - 5.6 % Final    Comment:    (NOTE) Pre diabetes:          5.7%-6.4%  Diabetes:              >6.4%  Glycemic control for   <7.0% adults with diabetes          Failed - AA eGFR in normal range and within 360 days    GFR, Est African American  Date Value Ref Range Status  04/17/2016 >89 >=60 mL/min Final   GFR calc Af Amer  Date Value Ref Range Status  08/04/2019 138 >59 mL/min/1.73 Final   GFR, Est Non African American  Date Value Ref Range Status  04/17/2016 >89 >=60 mL/min Final   GFR, Estimated  Date Value Ref Range Status  03/13/2021 >60 >60 mL/min Final    Comment:    (NOTE) Calculated using the CKD-EPI Creatinine Equation (2021)          Passed - Cr in normal range and within 360 days    Creat  Date Value Ref Range Status  04/17/2016 0.80 0.60 - 1.35 mg/dL Final   Creatinine, Ser  Date Value Ref Range Status  03/13/2021 0.84 0.61 - 1.24 mg/dL Final   Creatinine, Urine  Date Value Ref Range Status  04/17/2016 413 (H) 20 - 370 mg/dL Final    Comment:    Result repeated and verified. Result confirmed by automatic dilution.          Passed - Valid encounter within last 6 months    Recent Outpatient Visits          3 weeks ago Type 2 diabetes mellitus with diabetic polyneuropathy, with long-term current use of insulin ([place])   [place], Annie Main L, RPH-CPP   1 month ago Type 2 diabetes mellitus with diabetic  polyneuropathy, with long-term current use of insulin ([place])   [place], Neoma Laming B, MD   4 months ago Type 2 diabetes mellitus with diabetic polyneuropathy, with long-term current use of insulin ([place])   [place], Deborah B, MD   1 year ago Type 2 diabetes mellitus with diabetic polyneuropathy, with long-term current use of insulin ([place])   [place], Deborah B, MD   1 year ago Type 2 diabetes mellitus with hyperglycemia, with long-term current use of insulin [facility])   [place], RPH-CPP      Future Appointments            In 6 days Tresa Endo, [place]   In 4 weeks Kirste[place], Luanna Salk, MD [facility], CPR   In 1 month Tresa Endo, RPH-CPP  [place]

## 2021-03-22 NOTE — Progress Notes (Signed)
Paramedicine Encounter    Patient ID: Jason Stevenson, male    DOB: Aug 23, 1985, 35 y.o.   MRN: [id number]   Arrived for home visit for [name] who reports feeling good today with no complaints aside from the chronic neuropathy pain he has been experiencing.   Haniel denied shortness of breath, dizziness, chest pain. Vitals were obtained and are as noted in report. No edema noted. Lung sounds clear. Weight down this week. CBG- 104   Meds reviewed and confirmed. Pill box filled accordingly.   Appointments reviewed and confirmed.   I will set up rides for upcoming appointments.   [name] started on Farxiga by Dr. [name] on 12/5 he started taking on 12/7. I will check on same being covered by HF fund.   -CBG 104   Home visit complete.   Refills: Atorvastatin Zetia Metformin Pen Needles         Patient Care Team: Ladell Pier, MD as PCP - General (Internal Medicine) Josue Hector, MD as PCP - Cardiology (Cardiology) Vickie Epley, MD as PCP - Electrophysiology (Cardiology)  Patient Active Problem List   Diagnosis Date Noted   Poorly controlled type 2 diabetes mellitus with circulatory disorder ([name]) 03/19/2021   Ischemic cardiomyopathy    Syncope 12/14/2020   Chest pain 10/30/2020   History of cerebrovascular accident (CVA) with residual deficit 10/27/2020   Protein-calorie malnutrition, severe 09/22/2020   Acute CVA (cerebrovascular accident) ([name]) 09/21/2020   Near syncope 09/21/2020   Encounter for monitoring Coumadin therapy 09/20/2020   LV (left ventricular) mural thrombus 09/13/2020   Seizure ([name]) 09/13/2020   Neurological deficit present 09/12/2020   History of COVID-19 09/12/2020   Intracerebral hemorrhage 09/12/2020   Acute cerebrovascular accident (CVA) ([name]) 09/12/2020   Hyperosmolar hyperglycemic state (HHS) ([name]) 09/11/2020   COVID-19 08/23/2020   Noncompliance with medication treatment due to intermittent use of medication 10/12/2018    Dyslipidemia 04/17/2016   Uncontrolled type 2 diabetes mellitus without complication, with long-term current use of insulin 93/79/0240   Chronic systolic CHF (congestive heart failure) ([name]) 06/18/2015   Needs flu shot 12/23/2013   Type 2 diabetes mellitus ([name]) 11/18/2013   Diabetes ([name]) 06/17/2013   Non-ischemic cardiomyopathy ([name]) 11/06/2012   HTN (hypertension) 11/06/2012   HLD (hyperlipidemia) 11/06/2012    Current Outpatient Medications:    acetaminophen (TYLENOL) 500 MG tablet, Take 1,000 mg by mouth every 8 (eight) hours as needed for mild pain, fever or headache., Disp: , Rfl:    atorvastatin (LIPITOR) 80 MG tablet, Take 1 tablet (80 mg total) by mouth daily., Disp: 30 tablet, Rfl: 2   Blood Glucose Monitoring Suppl (TRUE METRIX METER) w/Device KIT, 1 each by Does not apply route 3 (three) times daily., Disp: 1 kit, Rfl: 0   Blood Pressure Monitor DEVI, Use as directed to check home blood pressure 2-3 times a week, Disp: 1 Device, Rfl: 0   dapagliflozin propanediol (FARXIGA) 5 MG TABS tablet, Take 1 tablet (5 mg total) by mouth daily before breakfast., Disp: 90 tablet, Rfl: 1   digoxin (LANOXIN) 0.125 MG tablet, Take 1 tablet (0.125 mg total) by mouth daily., Disp: 30 tablet, Rfl: 2   DULoxetine (CYMBALTA) 20 MG capsule, Take 1 capsule (20 mg total) by mouth daily., Disp: 30 capsule, Rfl: 3   Evolocumab (REPATHA SURECLICK) 973 MG/ML SOAJ, Inject 1 pen into the skin every 14 (fourteen) days., Disp: 2 mL, Rfl: 11   ezetimibe (ZETIA) 10 MG tablet, Take 1 tablet (10 mg total) by  mouth daily., Disp: 30 tablet, Rfl: 2   furosemide (LASIX) 20 MG tablet, Take 1 tablet (20 mg total) by mouth daily., Disp: 30 tablet, Rfl: 3   glucose blood (TRUE METRIX BLOOD GLUCOSE TEST) test strip, Use as instructed, Disp: 100 each, Rfl: 12   ibuprofen (ADVIL) 600 MG tablet, Take 600 mg by mouth every 6 (six) hours as needed., Disp: , Rfl:    insulin isophane & regular human KwikPen (NOVOLIN 70/30 KWIKPEN)  (70-30) 100 UNIT/ML KwikPen, 22 units SQ Q a.m and 16 units Q p.m, Disp: 15 mL, Rfl: 5   Insulin Pen Needle 32G X 4 MM MISC, Use in the morning, at noon, and at bedtime., Disp: 200 each, Rfl: 0   Insulin Syringe-Needle U-100 (RELION INSULIN SYRINGE) 31G X 15/64" 0.3 ML MISC, Use to inject insulin daily., Disp: 100 each, Rfl: 11   ivabradine (CORLANOR) 7.5 MG TABS tablet, Take 1 tablet (7.5 mg total) by mouth 2 (two) times daily with a meal., Disp: 60 tablet, Rfl:    levETIRAcetam (KEPPRA) 750 MG tablet, Take 2 tablets (1,500 mg total) by mouth 2 (two) times daily., Disp: 120 tablet, Rfl: 3   Menthol, Topical Analgesic, (ICY HOT BACK EX), Apply 1 application topically as needed (neuropathy). Icy hot cream, Disp: , Rfl:    metFORMIN (GLUCOPHAGE) 500 MG tablet, Take 1 tablet (500 mg total) by mouth 2 (two) times daily with a meal., Disp: 60 tablet, Rfl: 4   metoprolol (TOPROL-XL) 200 MG 24 hr tablet, Take 0.5 tablets (100 mg total) by mouth daily. Take with or immediately following a meal., Disp: 30 tablet, Rfl: 2   Oxcarbazepine (TRILEPTAL) 300 MG tablet, Take 2 tablets (600 mg total) by mouth 2 (two) times daily., Disp: 120 tablet, Rfl: 3   phenytoin (DILANTIN) 100 MG ER capsule, Take 1 capsule (100 mg total) by mouth 3 (three) times daily., Disp: 90 capsule, Rfl: 5   pregabalin (LYRICA) 100 MG capsule, Take 1 capsule (100 mg total) by mouth 3 (three) times daily., Disp: 90 capsule, Rfl: 2   sacubitril-valsartan (ENTRESTO) 24-26 MG, Take 1 tablet by mouth 2 (two) times daily., Disp: 60 tablet, Rfl: 0   spironolactone (ALDACTONE) 25 MG tablet, Take 1 tablet (25 mg total) by mouth at bedtime., Disp: 90 tablet, Rfl: 3   TRUEplus Lancets 28G MISC, Use as directed, Disp: 100 each, Rfl: 4   warfarin (COUMADIN) 5 MG tablet, Take 1-1.5 tablets (5-7.5 mg total) by mouth daily as directed by Coumadin clinic, Disp: 40 tablet, Rfl: 1 No Known Allergies   Social History   Socioeconomic History   Marital  status: Married    Spouse name: Not on file   Number of children: 0   Years of education: Not on file   Highest education level: Not on file  Occupational History   Occupation: unemployed  Tobacco Use   Smoking status: Never   Smokeless tobacco: Never  Vaping Use   Vaping Use: Not on file  Substance and Sexual Activity   Alcohol use: Not Currently    Comment: "occasional" when "hanging out with the wrong people" No recent use.   Drug use: Not Currently    Types: Marijuana    Comment: occasional, last 2013   Sexual activity: Not Currently  Other Topics Concern   Not on file  Social History Narrative   Pt lives at home with his wife    Right handed    Caffeine- hardly any   Social Determinants of  Health   Financial Resource Strain: High Risk   Difficulty of Paying Living Expenses: Hard  Food Insecurity: No Food Insecurity   Worried About Charity fundraiser in the Last Year: Never true   Ran Out of Food in the Last Year: Never true  Transportation Needs: Unmet Transportation Needs   Lack of Transportation (Medical): No   Lack of Transportation (Non-Medical): Yes  Physical Activity: Not on file  Stress: Not on file  Social Connections: Not on file  Intimate Partner Violence: Not on file    Physical Exam Vitals reviewed.  Constitutional:      Appearance: Normal appearance. He is normal weight.  HENT:     Head: Normocephalic.     Nose: Nose normal.     Mouth/Throat:     Mouth: Mucous membranes are moist.     Pharynx: Oropharynx is clear.  Eyes:     Conjunctiva/sclera: Conjunctivae normal.     Pupils: Pupils are equal, round, and reactive to light.  Cardiovascular:     Rate and Rhythm: Normal rate and regular rhythm.     Pulses: Normal pulses.     Heart sounds: Normal heart sounds.  Pulmonary:     Effort: Pulmonary effort is normal.     Breath sounds: Normal breath sounds.  Abdominal:     General: Abdomen is flat.     Palpations: Abdomen is soft.   Musculoskeletal:        General: No swelling. Normal range of motion.     Right lower leg: No edema.     Left lower leg: No edema.  Skin:    General: Skin is warm and dry.     Capillary Refill: Capillary refill takes less than 2 seconds.  Neurological:     General: No focal deficit present.     Mental Status: He is alert. Mental status is at baseline.  Psychiatric:        Mood and Affect: Mood normal.        Future Appointments  Date Time Provider [place]  03/28/2021  8:45 AM [facility]  03/28/2021  9:30 AM Tresa Endo, RPH-CPP [facility] None  04/03/2021  9:00 AM [facility] PA/NP [facility] None  04/19/2021  8:20 AM Philemon Kingdom, MD [facility] None  04/20/2021 10:45 AM Kirsteins, Luanna Salk, MD [facility] CPR  05/16/2021  9:30 AM Tresa Endo, RPH-CPP [facility] None  06/26/2021  9:30 AM Penumalli, Earlean Polka, MD [facility] None     ACTION: Home visit completed

## 2021-03-22 NOTE — Telephone Encounter (Signed)
Ride set up for 12/14 going to [facility].   Pick up time at 0806 Appointment at 0845    Call complete.

## 2021-03-23 ENCOUNTER — Other Ambulatory Visit: Payer: Self-pay

## 2021-03-26 ENCOUNTER — Other Ambulatory Visit (HOSPITAL_COMMUNITY): Payer: Self-pay | Admitting: *Deleted

## 2021-03-26 ENCOUNTER — Other Ambulatory Visit (HOSPITAL_COMMUNITY): Payer: Self-pay

## 2021-03-26 ENCOUNTER — Encounter (HOSPITAL_COMMUNITY): Payer: Self-pay

## 2021-03-26 MED ORDER — DAPAGLIFLOZIN PROPANEDIOL 5 MG PO TABS
5.0000 mg | ORAL_TABLET | Freq: Every day | ORAL | 3 refills | Status: DC
  Filled 2021-03-26: qty 30, 30d supply, fill #0

## 2021-03-26 NOTE — Telephone Encounter (Signed)
[employer] patient assistance application faxed

## 2021-03-27 ENCOUNTER — Telehealth: Payer: Self-pay

## 2021-03-27 NOTE — Telephone Encounter (Signed)
Call made to conduct screening for transportation needs. Was unable to reach patient. LVM. °

## 2021-03-27 NOTE — Progress Notes (Signed)
° °  S:    PCP: Dr. Laural Benes  Patient presents for diabetes evaluation, education, and management. Patient was referred and last seen by Primary Care Provider on 02/05/2021. Last seen by CPP 02/26/21. Saw endocrinology last week but was unable to determine if he has T1 or T2DM because his BG was too elevated.   Today, patient reports he has been taking his insulin as instructed by his endocrinologist and he has started taking [place]. His future fills will be at [facility] since he can get his HF medications there through the HF fund. He is hopeful to get a Dexcom soon so he can stop pricking his fingers so often. Reports 1 incidence of symptomatic hypoglycemia - BG was 73.   Family/Social History:  -Never smoker -DM in mother and sister  Insurance coverage/medication affordability: none  Medication adherence reported.   Current diabetes medications include: Novolin 70/30 22-25 units before breakfast and 14-16 units before dinner, metformin 500 mg BID, Farxiga 5 mg   Current hypertension/heart failure medications include: Metoprolol 100 mg QD, spironolactone 12.5 mg QD, Entresto 24-26 mg QD, digoxin 0.125 mg daily, ivabradine 7.5 mg QD, Farxiga 5 mg  Current hyperlipidemia medications include:  -Atorvastatin 80 mg QD -Ezetimibe 10 mg QD  Patient reported dietary habits: Eats 2-3 meals/day Patient recent reports efforts to improve  Patient-reported exercise habits: limited  Patient reports nocturia (nighttime urination). 0-1x/night.  Patient reports neuropathy (nerve pain). Patient reports visual changes including blurred vision at distance. Patient reports self foot exams.     O:  POCT BG: 152 (ate two hours ago)  Lab Results  Component Value Date   HGBA1C 8.2 (H) 01/29/2021   There were no vitals filed for this visit.  Lipid Panel     Component Value Date/Time   CHOL 153 12/04/2020 1018   CHOL 181 10/24/2020 1115   TRIG 115 12/04/2020 1018   HDL 49  12/04/2020 1018   HDL 67 10/24/2020 1115   CHOLHDL 3.1 12/04/2020 1018   VLDL 23 12/04/2020 1018   LDLCALC 81 12/04/2020 1018   LDLCALC 94 10/24/2020 1115   Home fasting blood sugars: 246, 177, 104, 158, 141, 107, 209, 109, 177 2 hour post-meal/random blood sugars: 166, 165, 115, 162, 164, 125  Clinical Atherosclerotic Cardiovascular Disease (ASCVD): Yes  The ASCVD Risk score (Arnett DK, et al., 2019) failed to calculate for the following reasons:   The 2019 ASCVD risk score is only valid for ages 71 to 27   The patient has a prior MI or stroke diagnosis    A/P: Diabetes currently improving on current regimen. Patient is able to verbalize appropriate hypoglycemia management plan. Medication adherence appears adequate. Blood glucose is currently controlled. Will defer further medication adjustments to endocrinology now that he has established with him.  -Continued Novolin 70/30 22-25 units before breakfast and 14-16 units before dinner, metformin 500 mg BID, Farxiga 5 mg  -Extensively discussed pathophysiology of diabetes, recommended lifestyle interventions, dietary effects on blood sugar control  -Counseled on s/sx of and management of hypoglycemia -Next A1C anticipated 05/01/2020.   ASCVD risk - secondary prevention in patient with diabetes. Last LDL is below goal, at 81 mg/dL. High intensity statin indicated.  -Continued atorvastatin 80 mg daily  Total time in face to face counseling 15 minutes. Follow up with endocrinology on 04/19/21.   Pervis Hocking, PharmD PGY2 Ambulatory Care Pharmacy Resident 03/28/2021 10:01 AM

## 2021-03-28 ENCOUNTER — Ambulatory Visit: Payer: Medicaid Other | Attending: Internal Medicine | Admitting: Pharmacist

## 2021-03-28 ENCOUNTER — Encounter: Payer: Self-pay | Admitting: Internal Medicine

## 2021-03-28 ENCOUNTER — Other Ambulatory Visit: Payer: Self-pay

## 2021-03-28 ENCOUNTER — Ambulatory Visit (INDEPENDENT_AMBULATORY_CARE_PROVIDER_SITE_OTHER): Payer: Medicaid Other | Admitting: Pharmacist

## 2021-03-28 DIAGNOSIS — I639 Cerebral infarction, unspecified: Secondary | ICD-10-CM

## 2021-03-28 DIAGNOSIS — Z794 Long term (current) use of insulin: Secondary | ICD-10-CM | POA: Diagnosis not present

## 2021-03-28 DIAGNOSIS — E1142 Type 2 diabetes mellitus with diabetic polyneuropathy: Secondary | ICD-10-CM | POA: Diagnosis not present

## 2021-03-28 DIAGNOSIS — I513 Intracardiac thrombosis, not elsewhere classified: Secondary | ICD-10-CM

## 2021-03-28 DIAGNOSIS — E1165 Type 2 diabetes mellitus with hyperglycemia: Secondary | ICD-10-CM

## 2021-03-28 DIAGNOSIS — E1159 Type 2 diabetes mellitus with other circulatory complications: Secondary | ICD-10-CM

## 2021-03-28 DIAGNOSIS — Z7901 Long term (current) use of anticoagulants: Secondary | ICD-10-CM | POA: Diagnosis not present

## 2021-03-28 DIAGNOSIS — Z5181 Encounter for therapeutic drug level monitoring: Secondary | ICD-10-CM

## 2021-03-28 DIAGNOSIS — E139 Other specified diabetes mellitus without complications: Secondary | ICD-10-CM | POA: Insufficient documentation

## 2021-03-28 LAB — POCT INR: INR: 1.7 — AB (ref 2.0–3.0)

## 2021-03-28 LAB — GLUCOSE, POCT (MANUAL RESULT ENTRY): POC Glucose: 152 mg/dl — AB (ref 70–99)

## 2021-03-28 NOTE — Patient Instructions (Signed)
Description   Today take 1.5 tablets then start taking 1 tablet daily except for 1.5 tablets on Tuesdays, Thursdays and Saturdays. Keep 2 leafy veggies in your diet weekly. Recheck in 10 days. Coumadin Clinic [phone].

## 2021-03-29 ENCOUNTER — Other Ambulatory Visit (HOSPITAL_COMMUNITY): Payer: Self-pay

## 2021-03-29 ENCOUNTER — Ambulatory Visit: Payer: Medicaid Other | Admitting: Physical Medicine & Rehabilitation

## 2021-03-29 ENCOUNTER — Telehealth (HOSPITAL_COMMUNITY): Payer: Self-pay

## 2021-03-29 ENCOUNTER — Telehealth: Payer: Self-pay

## 2021-03-29 ENCOUNTER — Other Ambulatory Visit: Payer: Self-pay

## 2021-03-29 NOTE — Telephone Encounter (Signed)
Rides set up for upcoming appointments:   12/20 for 9:00 appointment at HF Clinic Pick up time- 8:21    12/23 for 8:45 appointment at [facility] Pick up time- 8:06   Jason Stevenson texted same information.

## 2021-03-29 NOTE — Telephone Encounter (Signed)
At request of Dr Laural Benes, call placed to patient regarding his Medicaid coverage. He currently has [facility] but could benefit from full Medicaid coverage. He said that he re-applied for Medicaid and was denied and has submitted an appeal and is currently waiting for a decision.  Instructed him to call this CM if he is denied again and a referral can be made to [employer] [place] for assistance with another appeal. He said he understood.

## 2021-03-29 NOTE — Progress Notes (Signed)
Paramedicine Encounter    Patient ID: Jason Stevenson, male    DOB: [birth year], 35 y.o.   MRN: [id number]  Arrived for home visit for Jason Stevenson who reports feeling good with no complaints. Jason Stevenson has been compliant with all meds and denied chest pain, dizziness, shortness of breath or trouble sleeping or doing daily activities.   Vitals and assessment completed and as noted. CBG- 110   Meds reviewed and confirmed. Pill box filled until Tuesday as we will meet in HF clinic on Tuesday 12/20 at 0900.   Appointments reviewed and confirmed.   I will set up rides.   Jason Stevenson has not heard back from Medicaid/ Disability yet. He did hear from [facility] and they are waiting for Medicaid to go through.   I will see Jason Stevenson next week. Home visit complete.   Meds for Refill: -[facility]: Atorvastatin Zetia  -[facility]: Cymbalta Metformin   Patient Care Team: Ladell Pier, MD as PCP - General (Internal Medicine) Josue Hector, MD as PCP - Cardiology (Cardiology) Vickie Epley, MD as PCP - Electrophysiology (Cardiology)  Patient Active Problem List   Diagnosis Date Noted   LADA (latent autoimmune diabetes in adults), managed as type 2 ([place]) 03/28/2021   Poorly controlled type 2 diabetes mellitus with circulatory disorder ([place]) 03/19/2021   Ischemic cardiomyopathy    Syncope 12/14/2020   Chest pain 10/30/2020   History of cerebrovascular accident (CVA) with residual deficit 10/27/2020   Protein-calorie malnutrition, severe 09/22/2020   Acute CVA (cerebrovascular accident) ([place]) 09/21/2020   Near syncope 09/21/2020   Encounter for monitoring Coumadin therapy 09/20/2020   LV (left ventricular) mural thrombus 09/13/2020   Seizure ([place]) 09/13/2020   Neurological deficit present 09/12/2020   History of COVID-19 09/12/2020   Intracerebral hemorrhage 09/12/2020   Acute cerebrovascular accident (CVA) ([place]) 09/12/2020   COVID-19 08/23/2020   Noncompliance with medication treatment due to  intermittent use of medication 10/12/2018   Dyslipidemia 44/81/8563   Chronic systolic CHF (congestive heart failure) ([place]) 06/18/2015   Needs flu shot 12/23/2013   Non-ischemic cardiomyopathy ([place]) 11/06/2012   HTN (hypertension) 11/06/2012   HLD (hyperlipidemia) 11/06/2012    Current Outpatient Medications:    acetaminophen (TYLENOL) 500 MG tablet, Take 1,000 mg by mouth every 8 (eight) hours as needed for mild pain, fever or headache., Disp: , Rfl:    atorvastatin (LIPITOR) 80 MG tablet, Take 1 tablet (80 mg total) by mouth daily., Disp: 30 tablet, Rfl: 2   Blood Glucose Monitoring Suppl (TRUE METRIX METER) w/Device KIT, 1 each by Does not apply route 3 (three) times daily., Disp: 1 kit, Rfl: 0   Blood Pressure Monitor DEVI, Use as directed to check home blood pressure 2-3 times a week, Disp: 1 Device, Rfl: 0   dapagliflozin propanediol (FARXIGA) 5 MG TABS tablet, Take 1 tablet (5 mg total) by mouth daily before breakfast., Disp: 30 tablet, Rfl: 3   digoxin (LANOXIN) 0.125 MG tablet, Take 1 tablet (0.125 mg total) by mouth daily., Disp: 30 tablet, Rfl: 2   DULoxetine (CYMBALTA) 20 MG capsule, Take 1 capsule (20 mg total) by mouth daily., Disp: 30 capsule, Rfl: 3   Evolocumab (REPATHA SURECLICK) 149 MG/ML SOAJ, Inject 1 pen into the skin every 14 (fourteen) days., Disp: 2 mL, Rfl: 11   ezetimibe (ZETIA) 10 MG tablet, Take 1 tablet (10 mg total) by mouth daily., Disp: 30 tablet, Rfl: 2   furosemide (LASIX) 20 MG tablet, Take 1 tablet (20 mg total) by mouth daily.,  Disp: 30 tablet, Rfl: 3   glucose blood (TRUE METRIX BLOOD GLUCOSE TEST) test strip, Use as instructed, Disp: 100 each, Rfl: 12   ibuprofen (ADVIL) 600 MG tablet, Take 600 mg by mouth every 6 (six) hours as needed., Disp: , Rfl:    insulin isophane & regular human KwikPen (NOVOLIN 70/30 KWIKPEN) (70-30) 100 UNIT/ML KwikPen, 22 units SQ Q a.m and 16 units Q p.m, Disp: 15 mL, Rfl: 5   Insulin Pen Needle 32G X 4 MM MISC, Use in the  morning, at noon, and at bedtime., Disp: 200 each, Rfl: 0   Insulin Syringe-Needle U-100 (RELION INSULIN SYRINGE) 31G X 15/64" 0.3 ML MISC, Use to inject insulin daily., Disp: 100 each, Rfl: 11   ivabradine (CORLANOR) 7.5 MG TABS tablet, Take 1 tablet (7.5 mg total) by mouth 2 (two) times daily with a meal., Disp: 60 tablet, Rfl:    levETIRAcetam (KEPPRA) 750 MG tablet, Take 2 tablets (1,500 mg total) by mouth 2 (two) times daily., Disp: 120 tablet, Rfl: 3   Menthol, Topical Analgesic, (ICY HOT BACK EX), Apply 1 application topically as needed (neuropathy). Icy hot cream, Disp: , Rfl:    metFORMIN (GLUCOPHAGE) 500 MG tablet, Take 1 tablet (500 mg total) by mouth 2 (two) times daily with a meal., Disp: 60 tablet, Rfl: 2   metoprolol (TOPROL-XL) 200 MG 24 hr tablet, Take 0.5 tablets (100 mg total) by mouth daily. Take with or immediately following a meal., Disp: 30 tablet, Rfl: 2   Oxcarbazepine (TRILEPTAL) 300 MG tablet, Take 2 tablets (600 mg total) by mouth 2 (two) times daily., Disp: 120 tablet, Rfl: 3   phenytoin (DILANTIN) 100 MG ER capsule, Take 1 capsule (100 mg total) by mouth 3 (three) times daily., Disp: 90 capsule, Rfl: 5   pregabalin (LYRICA) 100 MG capsule, Take 1 capsule (100 mg total) by mouth 3 (three) times daily., Disp: 90 capsule, Rfl: 2   sacubitril-valsartan (ENTRESTO) 24-26 MG, Take 1 tablet by mouth 2 (two) times daily., Disp: 60 tablet, Rfl: 0   spironolactone (ALDACTONE) 25 MG tablet, Take 1 tablet (25 mg total) by mouth at bedtime., Disp: 90 tablet, Rfl: 3   TRUEplus Lancets 28G MISC, Use as directed, Disp: 100 each, Rfl: 4   warfarin (COUMADIN) 5 MG tablet, Take 1-1.5 tablets (5-7.5 mg total) by mouth daily as directed by Coumadin clinic, Disp: 40 tablet, Rfl: 1 No Known Allergies   Social History   Socioeconomic History   Marital status: Married    Spouse name: Not on file   Number of children: 0   Years of education: Not on file   Highest education level: Not on  file  Occupational History   Occupation: unemployed  Tobacco Use   Smoking status: Never   Smokeless tobacco: Never  Vaping Use   Vaping Use: Not on file  Substance and Sexual Activity   Alcohol use: Not Currently    Comment: "occasional" when "hanging out with the wrong people" No recent use.   Drug use: Not Currently    Types: Marijuana    Comment: occasional, last 2013   Sexual activity: Not Currently  Other Topics Concern   Not on file  Social History Narrative   Pt lives at home with his wife    Right handed    Caffeine- hardly any   Social Determinants of Health   Financial Resource Strain: High Risk   Difficulty of Paying Living Expenses: Hard  Food Insecurity: No Food Insecurity  Worried About Charity fundraiser in the Last Year: Never true   [employer] of Food in the Last Year: Never true  Transportation Needs: Unmet Transportation Needs   Lack of Transportation (Medical): No   Lack of Transportation (Non-Medical): Yes  Physical Activity: Not on file  Stress: Not on file  Social Connections: Not on file  Intimate Partner Violence: Not on file    Physical Exam Vitals reviewed.  Constitutional:      Appearance: Normal appearance. He is normal weight.  HENT:     Head: Normocephalic.     Nose: Nose normal.     Mouth/Throat:     Mouth: Mucous membranes are moist.     Pharynx: Oropharynx is clear.  Eyes:     Conjunctiva/sclera: Conjunctivae normal.     Pupils: Pupils are equal, round, and reactive to light.  Cardiovascular:     Rate and Rhythm: Normal rate and regular rhythm.     Pulses: Normal pulses.     Heart sounds: Normal heart sounds.  Pulmonary:     Effort: Pulmonary effort is normal.     Breath sounds: Normal breath sounds.  Abdominal:     General: Abdomen is flat.     Palpations: Abdomen is soft.  Musculoskeletal:        General: No swelling. Normal range of motion.     Cervical back: Normal range of motion.     Right lower leg: No edema.      Left lower leg: No edema.  Skin:    General: Skin is warm and dry.     Capillary Refill: Capillary refill takes less than 2 seconds.  Neurological:     General: No focal deficit present.     Mental Status: He is alert. Mental status is at baseline.  Psychiatric:        Mood and Affect: Mood normal.        Future Appointments  Date Time Provider [place]  04/03/2021  9:00 AM [facility] PA/NP [facility] None  04/06/2021  8:45 AM [facility]  04/19/2021  8:20 AM Philemon Kingdom, MD [facility] None  04/20/2021 10:45 AM Kirsteins, Luanna Salk, MD [facility] CPR  05/16/2021  9:30 AM Tresa Endo, RPH-CPP [facility] None  06/26/2021  9:30 AM Penumalli, Earlean Polka, MD [facility] None     ACTION: Home visit completed

## 2021-04-02 ENCOUNTER — Telehealth (HOSPITAL_COMMUNITY): Payer: Self-pay

## 2021-04-02 NOTE — Telephone Encounter (Signed)
Called and left patient a detailed message to confirm/remind patient of their appointment at the [facility] on 04/03/21. I also asked patient to give our office a call back if needed.

## 2021-04-02 NOTE — Progress Notes (Signed)
PCP: Dr. Karle Plumber Primary Cardiologist: Dr. Aundra Dubin  HPI: 35 y.o.male with diabetes, HTN, and a long history of nonischemic cardiomyopathy. He had a cardiac MRI in 1/08 showing low EF, but he says that he had been told about "heart problems" even prior to that.  He does not have a family history of cardiomyopathy that he knows of, but does not know his father's family.  Coronary CT angiogram in 2014 showed no CAD. Echo in 2/17 showed EF 20-25%, similar to 2014. Had repeat echo in 2020 showing LVEF 15-20%, RV normal. He had COVID-19 in 4/22.   He presented to [facility] ED 09/11/20 with tingling in left arm, left face, drooling and blank stare for about 1 week, and admitted for possible seizure, HHS and possible CVA.  In ED, he had left-sided neurologic deficit with dysarthria.  CT head with probable subacute infarct in right frontal lobe and old right parietal infarct.  Reportedly, patient's neurologic deficits resolved in ED, and focus shifted to possible seizure versus stroke.  He was loaded with Keppra.  MRI brain with probable hemorrhagic infarct involving the anterior right frontal lobe and left parietal occipital cortex with additional chronic right parietal infarct, right frontotemporal region enhancement suggesting changes related to acute seizure, possible toxic insult involving the caudate and lentiform nuclei bilaterally.  Neurology recommended interval follow-up brain MRI.  CTA head and neck without significant finding.  TTE with EF of 10-15%, LV thrombus, moderate RV dysfunction and dilated IVC.  He was discharged on GDMT, but A1c too high for SGLT2i.    Seen in [facility] ED 09/21/20 for fall after syncopal episode on toilet, hitting his head. CT of the head was significant for no bleeding but new low-density left cerebellum suggesting acute infarct. EEG unremarkable and felt fall secondary to vagal episode not seizure.  He had atypical chest pain at his hospital follow up. With his history of HLD and DM,  decisions was made to undergo [facility] to evaluate coronary anatomy and hemodynamics.  R/LHC (7/22) showed normal filling pressures, preserved cardiac output and no significant CAD. RA mean 1, RV 28/1, PA 19/7 mean 12, PCWP 3, CO/CI 5.54/2.92.  Again admitted 12/14/20-12/16/20 for syncopal episode. CT head negative. He was given IVF, beta blocker/spiro/Entresto held initially. Suspected orthostatic vs micturition syncope. EP consulted with known low EF and plans for ICD implant when a1c better controlled.  CPX in 9/22 showed severe HF limitation. Echo was done today and reviewed, EF < 20% with mild LV dilation, no LV thrombus noted, normal RV size with mildly decreased systolic function, IVC normal.   Follow up 11/22, weight up about 10 lbs.  Patient had a seizure thought to be related to hypoglycemia (paramedics called, improved with glucose). Lasix 20 mg started every other day, eventually increased to daily.   Today he returns for HF follow up with paramedicine. He remains SOB with walking further distances on flat ground or with stairs, this is his baseline. Overall feeling fine. Has slight positional dizziness but no further falls or syncope. Continues with some balance issues due to his peripheral neuropathy. Denies palpitations, CP, edema, or PND/Orthopnea. Appetite ok. No fever or chills. Weight at home stable, down 4 lbs. Taking all medications. Recently seen by Endocrinology and started on Farxiga 5 mg.  Labs (9/22): K 3.5, creatinine 0.85, digoxin 0.4 Labs (10/22): K 4.1, creatinine 0.8, digoxin 0.9  REDS clip 35%  ECG (personally reviewed): none ordered today.  ROS: All systems negative except as listed in  HPI, PMH and Problem List.  PMH:  1. Type 1 diabetes: Generally poorly controlled.  2. 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  3. H/o LV thrombus 4. Syncope: Vagal vs orthostatic, likely not arrhythmic.  5. Diabetic polyneuropathy.  6. Chronic  systolic CHF: Nonischemic cardiomyopathy.  Long-standing.  - Cardiac MRI (1/08): EF 45%, no late gadolinium enhancement.  - Coronary CTA (7/14): No significant CAD - Echo (7/14): EF 20-25%, severe LV dilation.  - Echo (6/22): EF 10-15%, LV thrombus, mild LV dilation, moderately decreased RV systolic function with normal size, mild MR, dilated IVC.  - LHC/RHC (7/22): No CAD; mean RA 1, PA 19/7, mean PCWP 3, CI 2.92.  - Echo (10/22): EF < 20%, mild LV dilation, normal RV size with mildly decreased systolic function, IVC normal.  - CPX (9/22): peak VO2 10.4, VE/VCO2 slope 26, RER 1.22 => severe HF limitation.  7. 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   SH:  Social History   Socioeconomic History   Marital status: Married    Spouse name: Not on file   Number of children: 0   Years of education: Not on file   Highest education level: Not on file  Occupational History   Occupation: unemployed  Tobacco Use   Smoking status: Never   Smokeless tobacco: Never  Vaping Use   Vaping Use: Not on file  Substance and Sexual Activity   Alcohol use: Not Currently    Comment: "occasional" when "hanging out with the wrong people" No recent use.   Drug use: Not Currently    Types: Marijuana    Comment: occasional, last 2013   Sexual activity: Not Currently  Other Topics Concern   Not on file  Social History Narrative   Pt lives at home with his wife    Right handed    Caffeine- hardly any   Social Determinants of Health   Financial Resource Strain: High Risk   Difficulty of Paying Living Expenses: Hard  Food Insecurity: No Food Insecurity   Worried About Charity fundraiser in the Last Year: Never true   Ran Out of Food in the Last Year: Never true  Transportation Needs: Unmet Transportation Needs   Lack of Transportation (Medical): No   Lack of Transportation (Non-Medical): Yes  Physical Activity: Not on file  Stress: Not on file  Social Connections: Not on file  Intimate Partner  Violence: Not on file   FH:  Family History  Problem Relation Age of Onset   Stroke Mother    Diabetes Mother    Hypertension Mother    Stroke Maternal Aunt    Heart attack Neg Hx     Current Outpatient Medications  Medication Sig Dispense Refill   acetaminophen (TYLENOL) 500 MG tablet Take 1,000 mg by mouth every 8 (eight) hours as needed for mild pain, fever or headache.     atorvastatin (LIPITOR) 80 MG tablet Take 1 tablet (80 mg total) by mouth daily. 30 tablet 2   Blood Glucose Monitoring Suppl (TRUE METRIX METER) w/Device KIT 1 each by Does not apply route 3 (three) times daily. 1 kit 0   Blood Pressure Monitor DEVI Use as directed to check home blood pressure 2-3 times a week 1 Device 0   dapagliflozin propanediol (FARXIGA) 5 MG TABS tablet Take 1 tablet (5 mg total) by mouth daily before breakfast. 30 tablet 3   digoxin (LANOXIN) 0.125 MG tablet Take 1 tablet (0.125 mg total)  by mouth daily. 30 tablet 2   DULoxetine (CYMBALTA) 20 MG capsule Take 1 capsule (20 mg total) by mouth daily. 30 capsule 3   Evolocumab (REPATHA SURECLICK) 798 MG/ML SOAJ Inject 1 pen into the skin every 14 (fourteen) days. 2 mL 11   ezetimibe (ZETIA) 10 MG tablet Take 1 tablet (10 mg total) by mouth daily. 30 tablet 2   furosemide (LASIX) 20 MG tablet Take 1 tablet (20 mg total) by mouth daily. 30 tablet 3   glucose blood (TRUE METRIX BLOOD GLUCOSE TEST) test strip Use as instructed 100 each 12   ibuprofen (ADVIL) 600 MG tablet Take 600 mg by mouth every 6 (six) hours as needed.     insulin isophane & regular human KwikPen (NOVOLIN 70/30 KWIKPEN) (70-30) 100 UNIT/ML KwikPen 22 units SQ Q a.m and 16 units Q p.m 15 mL 5   Insulin Pen Needle 32G X 4 MM MISC Use in the morning, at noon, and at bedtime. 200 each 0   Insulin Syringe-Needle U-100 (RELION INSULIN SYRINGE) 31G X 15/64" 0.3 ML MISC Use to inject insulin daily. 100 each 11   ivabradine (CORLANOR) 7.5 MG TABS tablet Take 1 tablet (7.5 mg total) by  mouth 2 (two) times daily with a meal. 60 tablet    levETIRAcetam (KEPPRA) 750 MG tablet Take 2 tablets (1,500 mg total) by mouth 2 (two) times daily. 120 tablet 3   Menthol, Topical Analgesic, (ICY HOT BACK EX) Apply 1 application topically as needed (neuropathy). Icy hot cream     metFORMIN (GLUCOPHAGE) 500 MG tablet Take 1 tablet (500 mg total) by mouth 2 (two) times daily with a meal. 60 tablet 2   metoprolol (TOPROL-XL) 200 MG 24 hr tablet Take 0.5 tablets (100 mg total) by mouth daily. Take with or immediately following a meal. 30 tablet 2   Oxcarbazepine (TRILEPTAL) 300 MG tablet Take 2 tablets (600 mg total) by mouth 2 (two) times daily. 120 tablet 3   phenytoin (DILANTIN) 100 MG ER capsule Take 1 capsule (100 mg total) by mouth 3 (three) times daily. 90 capsule 5   pregabalin (LYRICA) 100 MG capsule Take 1 capsule (100 mg total) by mouth 3 (three) times daily. 90 capsule 2   sacubitril-valsartan (ENTRESTO) 24-26 MG Take 1 tablet by mouth 2 (two) times daily. 60 tablet 0   spironolactone (ALDACTONE) 25 MG tablet Take 1 tablet (25 mg total) by mouth at bedtime. 90 tablet 3   TRUEplus Lancets 28G MISC Use as directed 100 each 4   warfarin (COUMADIN) 5 MG tablet Take 1-1.5 tablets (5-7.5 mg total) by mouth daily as directed by Coumadin clinic 40 tablet 1   No current facility-administered medications for this encounter.   BP 102/74    Pulse 78    Wt 73.7 kg (162 lb 6.4 oz)    SpO2 98%    BMI 22.65 kg/m   Wt Readings from Last 3 Encounters:  04/03/21 73.7 kg (162 lb 6.4 oz)  03/29/21 71 kg (156 lb 9.6 oz)  03/22/21 69.9 kg (154 lb)   PHYSICAL EXAM: General:  NAD. No resp difficulty, walked into clinic, thin HEENT: Normal Neck: Supple. No JVD. Carotids 2+ bilat; no bruits. No lymphadenopathy or thryomegaly appreciated. Cor: PMI nondisplaced. Regular rate & rhythm. No rubs, gallops or murmurs. Lungs: Clear Abdomen: Soft, nontender, nondistended. No hepatosplenomegaly. No bruits or  masses. Good bowel sounds. Extremities: No cyanosis, clubbing, rash, edema Neuro: Alert & oriented x 3, cranial nerves grossly intact.  Moves all 4 extremities w/o difficulty. Affect pleasant.  ASSESSMENT & PLAN:  1. Chronic systolic CHF: Nonischemic cardiomyopathy known for years (since at least 2008).  Cardiac MRI in 2008 with EF 45%, no LGE.  Normal coronary CTA in 2014.  Echo (7/22) with EF 10-15%, LV thrombus, moderate RV dysfunction, dilated IVC. R/LHC (6/22) showed normal filling pressures, preserved cardiac output and no significant CAD. CPX in 9/22 showed severe HF limitation concerning for advanced HF.  Echo in 9/22 showed EF < 20% with mild LV dilation, no LV thrombus noted, normal RV size with mildly decreased systolic function, IVC normal.  NYHA class III symptoms chronically, he is not volume overloaded on exam today, REDS clip 35%, weight down 4 lbs since last visit. - Continue spironolactone 25 mg daily (take at bedtime).  BMET today. - Continue Farxiga 5 mg daily (this was started by Endocrinology). - Continue Entresto 24/26 mg bid (will not titrate further with soft BP) - Continue Toprol XL 100 mg daily (decreased with lightheadedness). - Continue Ivabradine 7.5 mg bid.  - Continue digoxin 0.125. Check dig level today. - Continue Lasix 20 mg daily. - Though cardiac output was preserved on 6/22 RHC, CPX showed a severe HF limitation concerning for advanced HF.  He has been referred to Dr. Mosetta Pigeon at [facility] for a transplant evaluation.  Their office has contacted him and are awaiting formal approval for Medicaid. Paramedicine will ensure they are notified when this occurs. - EP planning on ICD.  Narrow QRS, so not CRT candidate.  2.  Syncope: Suspect this was orthostatic vs vagal rather than arrhythmic.  No further events. Will likely be able to tolerate only low-dose Entresto.  - Plan on ICD placement. Medicaid has been approved, waiting on formal approval letter.  - Will arrange follow  up with Dr. Quentin Ore.  3. Diabetes: Insulin-dependent, started in his 71s.  Control better recently but has been poor in past.  - Followed by endocrinology. Now on SGLT2i. 4. 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  - On keppra, dilantin 5. 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   - On Coumadin and statin.  6. LV Thrombus: with associated CVA. Most recent echo in 9/22 did not show a thrombus.  - On Coumadin. No bleeding issues.  Medicaid is on the way, he has disability and Medicaid approval now. [facility] will follow up with him as soon as formal Medicaid is processed.   Follow up with Dr. Aundra Dubin in 2 months  [place] FNP 04/03/2021

## 2021-04-03 ENCOUNTER — Other Ambulatory Visit (HOSPITAL_COMMUNITY): Payer: Self-pay | Admitting: *Deleted

## 2021-04-03 ENCOUNTER — Other Ambulatory Visit: Payer: Self-pay

## 2021-04-03 ENCOUNTER — Other Ambulatory Visit (HOSPITAL_COMMUNITY): Payer: Self-pay

## 2021-04-03 ENCOUNTER — Ambulatory Visit (HOSPITAL_COMMUNITY)
Admission: RE | Admit: 2021-04-03 | Discharge: 2021-04-03 | Disposition: A | Discharge status: Home / Self Care | Payer: Medicaid Other | Source: Ambulatory Visit | Attending: Family Medicine | Admitting: Family Medicine

## 2021-04-03 ENCOUNTER — Encounter (HOSPITAL_COMMUNITY): Payer: Self-pay

## 2021-04-03 ENCOUNTER — Other Ambulatory Visit: Payer: Self-pay | Admitting: Cardiology

## 2021-04-03 VITALS — BP 102/74 | HR 78 | Wt 162.4 lb

## 2021-04-03 DIAGNOSIS — R55 Syncope and collapse: Secondary | ICD-10-CM

## 2021-04-03 DIAGNOSIS — E1159 Type 2 diabetes mellitus with other circulatory complications: Secondary | ICD-10-CM

## 2021-04-03 DIAGNOSIS — Z560 Unemployment, unspecified: Secondary | ICD-10-CM | POA: Insufficient documentation

## 2021-04-03 DIAGNOSIS — I639 Cerebral infarction, unspecified: Secondary | ICD-10-CM | POA: Diagnosis not present

## 2021-04-03 DIAGNOSIS — G40909 Epilepsy, unspecified, not intractable, without status epilepticus: Secondary | ICD-10-CM | POA: Insufficient documentation

## 2021-04-03 DIAGNOSIS — R569 Unspecified convulsions: Secondary | ICD-10-CM

## 2021-04-03 DIAGNOSIS — Z8673 Personal history of transient ischemic attack (TIA), and cerebral infarction without residual deficits: Secondary | ICD-10-CM | POA: Diagnosis not present

## 2021-04-03 DIAGNOSIS — R42 Dizziness and giddiness: Secondary | ICD-10-CM | POA: Diagnosis not present

## 2021-04-03 DIAGNOSIS — Z09 Encounter for follow-up examination after completed treatment for conditions other than malignant neoplasm: Secondary | ICD-10-CM | POA: Insufficient documentation

## 2021-04-03 DIAGNOSIS — Z7984 Long term (current) use of oral hypoglycemic drugs: Secondary | ICD-10-CM | POA: Diagnosis not present

## 2021-04-03 DIAGNOSIS — I110 Hypertensive heart disease with heart failure: Secondary | ICD-10-CM | POA: Insufficient documentation

## 2021-04-03 DIAGNOSIS — E785 Hyperlipidemia, unspecified: Secondary | ICD-10-CM | POA: Insufficient documentation

## 2021-04-03 DIAGNOSIS — I428 Other cardiomyopathies: Secondary | ICD-10-CM | POA: Diagnosis not present

## 2021-04-03 DIAGNOSIS — R0602 Shortness of breath: Secondary | ICD-10-CM | POA: Diagnosis not present

## 2021-04-03 DIAGNOSIS — Z79899 Other long term (current) drug therapy: Secondary | ICD-10-CM | POA: Insufficient documentation

## 2021-04-03 DIAGNOSIS — E1042 Type 1 diabetes mellitus with diabetic polyneuropathy: Secondary | ICD-10-CM | POA: Diagnosis not present

## 2021-04-03 DIAGNOSIS — Z7901 Long term (current) use of anticoagulants: Secondary | ICD-10-CM | POA: Diagnosis not present

## 2021-04-03 DIAGNOSIS — I513 Intracardiac thrombosis, not elsewhere classified: Secondary | ICD-10-CM

## 2021-04-03 DIAGNOSIS — I5022 Chronic systolic (congestive) heart failure: Secondary | ICD-10-CM | POA: Insufficient documentation

## 2021-04-03 DIAGNOSIS — Z794 Long term (current) use of insulin: Secondary | ICD-10-CM | POA: Insufficient documentation

## 2021-04-03 LAB — BASIC METABOLIC PANEL
Anion gap: 5 (ref 5–15)
BUN: 13 mg/dL (ref 6–20)
CO2: 30 mmol/L (ref 22–32)
Calcium: 9 mg/dL (ref 8.9–10.3)
Chloride: 104 mmol/L (ref 98–111)
Creatinine, Ser: 0.85 mg/dL (ref 0.61–1.24)
GFR, Estimated: >60 mL/min (ref >60)
Glucose, Bld: 127 mg/dL — ABNORMAL HIGH (ref 70–99)
Potassium: 3.8 mmol/L (ref 3.5–5.1)
Sodium: 139 mmol/L (ref 135–145)

## 2021-04-03 MED ORDER — WARFARIN SODIUM 5 MG PO TABS
5.0000 mg | ORAL_TABLET | Freq: Every day | ORAL | 3 refills | Status: DC
  Filled 2021-04-03: qty 40, 26d supply, fill #0
  Filled 2021-05-22: qty 40, 26d supply, fill #1
  Filled 2021-06-26: qty 40, 26d supply, fill #2
  Filled 2021-07-25: qty 40, 26d supply, fill #3

## 2021-04-03 NOTE — Progress Notes (Signed)
ReDS Vest / Clip - 04/03/21 0900       ReDS Vest / Clip   Station Marker C    Ruler Value 29.5    ReDS Value Range Low volume    ReDS Actual Value 35

## 2021-04-03 NOTE — Progress Notes (Signed)
Paramedicine Encounter    Patient ID: Jason Stevenson, male    DOB: 06/03/1985, 35 y.o.   MRN: [id number]  Met with Damiano in clinic today where he was seen by NP [name].  REDS CLIP- 35%   Meds reviewed, no med changes today.  Pill box filled accordingly.   Will get labs today and follow up with Dr. Aundra Dubin in 8 weeks.   I will see Sharod in one week in the home.   Awaiting final medicaid letter.  [facility] has been in contact and will follow up once medicaid comes through.   Refills: Lasix Warfarin  Entresto Corlanor   ACTION: Home visit completed

## 2021-04-03 NOTE — Patient Instructions (Signed)
It was great to see you today! No medication changes are needed at this time.   Labs today We will only contact you if something comes back abnormal or we need to make some changes. Otherwise no news is good news!  You have been referred to [facility]rophys[facility] with Dr Lalla Brothers -they will be in touch with an appointment   Your physician recommends that you schedule a follow-up appointment in: 2 months with Dr Shirlee Latch  Do the following things EVERYDAY: Weigh yourself in the morning before breakfast. Write it down and keep it in a log. Take your medicines as prescribed Eat low salt foods--Limit salt (sodium) to 2000 mg per day.  Stay as active as you can everyday Limit all fluids for the day to less than 2 liters  At the [facility], you and your health needs are our priority. As part of our continuing mission to provide you with exceptional heart care, we have created designated Provider Care Teams. These Care Teams include your primary Cardiologist (physician) and Advanced Practice Providers (APPs- Physician Assistants and Nurse Practitioners) who all work together to provide you with the care you need, when you need it.   You may see any of the following providers on your designated Care Team at your next follow up: Dr Arvilla Meres Dr Carron Curie, NP Robbie Lis, [facility] [place], [place] Karle Plumber, PharmD   Please be sure to bring in all your medications bottles to every appointment.     If you have any questions or concerns before your next appointment please send [place] a message through [place] or call our office at [phone].    TO LEAVE A MESSAGE FOR THE NURSE SELECT OPTION 2, PLEASE LEAVE A MESSAGE INCLUDING: YOUR NAME DATE OF BIRTH CALL BACK NUMBER REASON FOR CALL**this is important as we prioritize the call backs  YOU WILL RECEIVE A CALL BACK THE SAME DAY AS LONG AS YOU CALL BEFORE 4:00 PM

## 2021-04-04 ENCOUNTER — Telehealth (HOSPITAL_COMMUNITY): Payer: Self-pay | Admitting: Licensed Clinical Social Worker

## 2021-04-04 ENCOUNTER — Other Ambulatory Visit: Payer: Self-pay

## 2021-04-04 NOTE — Telephone Encounter (Signed)
CSW checking in on status of pt Medicaid.  Saw that chart now stating that pt has [place] Medicaid and was able to confirm this using [employer].  CSW messaged provider to inform as they were wanting to refer him for transplant evaluation.  Will continue to follow and assist as needed  Burna Sis, LCSW Clinical Social Worker [facility] Desk#: [phone] Cell#: [phone]

## 2021-04-05 ENCOUNTER — Other Ambulatory Visit: Payer: Self-pay

## 2021-04-05 ENCOUNTER — Telehealth (HOSPITAL_COMMUNITY): Payer: Self-pay

## 2021-04-05 ENCOUNTER — Other Ambulatory Visit: Payer: Self-pay | Admitting: Pharmacist

## 2021-04-05 ENCOUNTER — Other Ambulatory Visit (HOSPITAL_COMMUNITY): Payer: Self-pay

## 2021-04-05 DIAGNOSIS — Z794 Long term (current) use of insulin: Secondary | ICD-10-CM

## 2021-04-05 MED ORDER — OXCARBAZEPINE 300 MG PO TABS: 600.0000 mg | ORAL_TABLET | Freq: Two times a day (BID) | ORAL | 3 refills | Status: DC

## 2021-04-05 MED ORDER — EZETIMIBE 10 MG PO TABS: 10.0000 mg | ORAL_TABLET | Freq: Every day | ORAL | 2 refills | Status: DC

## 2021-04-05 MED ORDER — SPIRONOLACTONE 25 MG PO TABS: 25.0000 mg | ORAL_TABLET | Freq: Every day | ORAL | 3 refills | Status: DC

## 2021-04-05 MED ORDER — METOPROLOL SUCCINATE ER 200 MG PO TB24: 100.0000 mg | ORAL_TABLET | Freq: Every day | ORAL | 2 refills | Status: DC

## 2021-04-05 MED ORDER — ACCU-CHEK SOFTCLIX LANCETS MISC: 3 refills | Status: DC

## 2021-04-05 MED ORDER — DAPAGLIFLOZIN PROPANEDIOL 5 MG PO TABS: 5.0000 mg | ORAL_TABLET | Freq: Every day | ORAL | 3 refills | Status: DC

## 2021-04-05 MED ORDER — ACCU-CHEK GUIDE VI STRP: ORAL_STRIP | 2 refills | Status: DC

## 2021-04-05 MED ORDER — LEVETIRACETAM 750 MG PO TABS: 1500.0000 mg | ORAL_TABLET | Freq: Two times a day (BID) | ORAL | 3 refills | Status: DC

## 2021-04-05 MED ORDER — INSULIN PEN NEEDLE 32G X 4 MM MISC: 2 refills | Status: DC

## 2021-04-05 MED ORDER — DIGOXIN 125 MCG PO TABS: 0.1250 mg | ORAL_TABLET | Freq: Every day | ORAL | 2 refills | Status: DC

## 2021-04-05 MED ORDER — NOVOLIN 70/30 FLEXPEN (70-30) 100 UNIT/ML ~~LOC~~ SUPN: PEN_INJECTOR | SUBCUTANEOUS | 5 refills | Status: DC

## 2021-04-05 MED ORDER — ACCU-CHEK GUIDE W/DEVICE KIT: PACK | 0 refills | Status: DC

## 2021-04-05 MED ORDER — METFORMIN HCL 500 MG PO TABS: 500.0000 mg | ORAL_TABLET | Freq: Two times a day (BID) | ORAL | 2 refills | Status: DC

## 2021-04-05 MED ORDER — PHENYTOIN SODIUM EXTENDED 100 MG PO CAPS: 100.0000 mg | ORAL_CAPSULE | Freq: Three times a day (TID) | ORAL | 5 refills | Status: DC

## 2021-04-05 MED ORDER — FUROSEMIDE 20 MG PO TABS: 20.0000 mg | ORAL_TABLET | Freq: Every day | ORAL | 3 refills | Status: DC

## 2021-04-05 MED ORDER — DULOXETINE HCL 20 MG PO CPEP
20.0000 mg | ORAL_CAPSULE | Freq: Every day | ORAL | 3 refills | Status: DC
Start: 2021-04-05 — End: 2021-04-20

## 2021-04-05 MED ORDER — ATORVASTATIN CALCIUM 80 MG PO TABS: 80.0000 mg | ORAL_TABLET | Freq: Every day | ORAL | 2 refills | Status: DC

## 2021-04-05 NOTE — Telephone Encounter (Signed)
Billal called me in reference to letting me know he heard from [facility] transplant team and he will be going into their office on 12/27 for consultation.    We also discussed moving all medications to [facility] with his Medicaid now in effect. He agreed with plan and I will begin working on this.

## 2021-04-06 ENCOUNTER — Other Ambulatory Visit (HOSPITAL_COMMUNITY): Payer: Self-pay | Admitting: Family Medicine

## 2021-04-06 ENCOUNTER — Ambulatory Visit (INDEPENDENT_AMBULATORY_CARE_PROVIDER_SITE_OTHER): Payer: Medicaid Other

## 2021-04-06 ENCOUNTER — Other Ambulatory Visit: Payer: Self-pay

## 2021-04-06 DIAGNOSIS — I513 Intracardiac thrombosis, not elsewhere classified: Secondary | ICD-10-CM

## 2021-04-06 DIAGNOSIS — I639 Cerebral infarction, unspecified: Secondary | ICD-10-CM | POA: Diagnosis not present

## 2021-04-06 DIAGNOSIS — Z5181 Encounter for therapeutic drug level monitoring: Secondary | ICD-10-CM

## 2021-04-06 DIAGNOSIS — Z7901 Long term (current) use of anticoagulants: Secondary | ICD-10-CM

## 2021-04-06 LAB — POCT INR: INR: 1.6 — AB (ref 2.0–3.0)

## 2021-04-06 NOTE — Patient Instructions (Signed)
Description   Take 1.5 tablets today and then Start taking 1 tablet daily except for 1.5 tablets on Sunday, Tuesdays, Thursdays and Saturdays. Keep 2 leafy veggies in your diet weekly. Recheck in 1 week. Coumadin Clinic [phone].

## 2021-04-10 ENCOUNTER — Telehealth (HOSPITAL_COMMUNITY): Payer: Self-pay

## 2021-04-10 ENCOUNTER — Other Ambulatory Visit (HOSPITAL_COMMUNITY): Payer: Self-pay

## 2021-04-10 LAB — DIGOXIN LEVEL: Digoxin, Serum: 0.5 ng/mL (ref 0.5–0.9)

## 2021-04-10 NOTE — Progress Notes (Signed)
Paramedicine Encounter    Patient ID: Jason Stevenson, male    DOB: 11/08/1985, 35 y.o.   MRN: [id number]  Arrived for home visit for Jason Stevenson where he was alert and oriented reporting to be feeling good. He states he has had no chest pain, dizziness, shortness of breath over the last week. He has been med compliant. Meds were reviewed and confirmed. Pill box filled accordingly. He is now getting meds from [employer].   Medicaid now active. He is wanting an eye doctor and dentist appointment. I will assist.   We reviewed appointments and confirmed same. I will set up ride for him for 12/30 for coumadin clinic. Ride pickup for 7:34.  Jason Stevenson was inquiring if he needs to be re-referred to Neuro PT. I will check.   CBG- 166   Home visit complete.   Refills: Pregabalin Farxiga    Patient Care Team: Ladell Pier, MD as PCP - General (Internal Medicine) Josue Hector, MD as PCP - Cardiology (Cardiology) Vickie Epley, MD as PCP - Electrophysiology (Cardiology)  Patient Active Problem List   Diagnosis Date Noted   LADA (latent autoimmune diabetes in adults), managed as type 2 ([place]) 03/28/2021   Poorly controlled type 2 diabetes mellitus with circulatory disorder ([place]) 03/19/2021   Ischemic cardiomyopathy    Syncope 12/14/2020   Chest pain 10/30/2020   History of cerebrovascular accident (CVA) with residual deficit 10/27/2020   Protein-calorie malnutrition, severe 09/22/2020   Acute CVA (cerebrovascular accident) ([place]) 09/21/2020   Near syncope 09/21/2020   Encounter for monitoring Coumadin therapy 09/20/2020   LV (left ventricular) mural thrombus 09/13/2020   Seizure ([place]) 09/13/2020   Neurological deficit present 09/12/2020   History of COVID-19 09/12/2020   Intracerebral hemorrhage 09/12/2020   Acute cerebrovascular accident (CVA) ([place]) 09/12/2020   COVID-19 08/23/2020   Noncompliance with medication treatment due to intermittent use of medication 10/12/2018    Dyslipidemia 69/48/5462   Chronic systolic CHF (congestive heart failure) ([place]) 06/18/2015   Needs flu shot 12/23/2013   Non-ischemic cardiomyopathy ([place]) 11/06/2012   HTN (hypertension) 11/06/2012   HLD (hyperlipidemia) 11/06/2012    Current Outpatient Medications:    Accu-Chek Softclix Lancets lancets, Use to check blood sugar three times daily., Disp: 100 each, Rfl: 3   acetaminophen (TYLENOL) 500 MG tablet, Take 1,000 mg by mouth every 8 (eight) hours as needed for mild pain, fever or headache., Disp: , Rfl:    atorvastatin (LIPITOR) 80 MG tablet, Take 1 tablet (80 mg total) by mouth daily., Disp: 30 tablet, Rfl: 2   Blood Glucose Monitoring Suppl (ACCU-CHEK GUIDE) w/Device KIT, Use to check blood sugar three times daily., Disp: 1 kit, Rfl: 0   Blood Pressure Monitor DEVI, Use as directed to check home blood pressure 2-3 times a week, Disp: 1 Device, Rfl: 0   dapagliflozin propanediol (FARXIGA) 5 MG TABS tablet, Take 1 tablet (5 mg total) by mouth daily before breakfast., Disp: 30 tablet, Rfl: 3   digoxin (LANOXIN) 0.125 MG tablet, Take 1 tablet (0.125 mg total) by mouth daily., Disp: 30 tablet, Rfl: 2   DULoxetine (CYMBALTA) 20 MG capsule, Take 1 capsule (20 mg total) by mouth daily., Disp: 30 capsule, Rfl: 3   Evolocumab (REPATHA SURECLICK) 703 MG/ML SOAJ, Inject 1 pen into the skin every 14 (fourteen) days., Disp: 2 mL, Rfl: 11   ezetimibe (ZETIA) 10 MG tablet, Take 1 tablet (10 mg total) by mouth daily., Disp: 30 tablet, Rfl: 2   furosemide (LASIX) 20  MG tablet, Take 1 tablet (20 mg total) by mouth daily., Disp: 30 tablet, Rfl: 3   glucose blood (ACCU-CHEK GUIDE) test strip, Use to check blood sugar three times daily., Disp: 100 each, Rfl: 2   ibuprofen (ADVIL) 600 MG tablet, Take 600 mg by mouth every 6 (six) hours as needed., Disp: , Rfl:    insulin isophane & regular human KwikPen (NOVOLIN 70/30 KWIKPEN) (70-30) 100 UNIT/ML KwikPen, 22 units SQ Q a.m and 16 units Q p.m, Disp: 15 mL,  Rfl: 5   Insulin Pen Needle 32G X 4 MM MISC, Use to inject insulin., Disp: 200 each, Rfl: 2   Insulin Syringe-Needle U-100 (RELION INSULIN SYRINGE) 31G X 15/64" 0.3 ML MISC, Use to inject insulin daily., Disp: 100 each, Rfl: 11   ivabradine (CORLANOR) 7.5 MG TABS tablet, Take 1 tablet (7.5 mg total) by mouth 2 (two) times daily with a meal., Disp: 60 tablet, Rfl:    levETIRAcetam (KEPPRA) 750 MG tablet, Take 2 tablets (1,500 mg total) by mouth 2 (two) times daily., Disp: 120 tablet, Rfl: 3   Menthol, Topical Analgesic, (ICY HOT BACK EX), Apply 1 application topically as needed (neuropathy). Icy hot cream, Disp: , Rfl:    metFORMIN (GLUCOPHAGE) 500 MG tablet, Take 1 tablet (500 mg total) by mouth 2 (two) times daily with a meal., Disp: 60 tablet, Rfl: 2   metoprolol (TOPROL-XL) 200 MG 24 hr tablet, Take 0.5 tablets (100 mg total) by mouth daily. Take with or immediately following a meal., Disp: 30 tablet, Rfl: 2   Oxcarbazepine (TRILEPTAL) 300 MG tablet, Take 2 tablets (600 mg total) by mouth 2 (two) times daily., Disp: 120 tablet, Rfl: 3   phenytoin (DILANTIN) 100 MG ER capsule, Take 1 capsule (100 mg total) by mouth 3 (three) times daily., Disp: 90 capsule, Rfl: 5   pregabalin (LYRICA) 100 MG capsule, Take 1 capsule (100 mg total) by mouth 3 (three) times daily., Disp: 90 capsule, Rfl: 2   sacubitril-valsartan (ENTRESTO) 24-26 MG, Take 1 tablet by mouth 2 (two) times daily., Disp: 60 tablet, Rfl: 0   spironolactone (ALDACTONE) 25 MG tablet, Take 1 tablet (25 mg total) by mouth at bedtime., Disp: 90 tablet, Rfl: 3   warfarin (COUMADIN) 5 MG tablet, Take 1-1.5 tablets (5-7.5 mg total) by mouth daily as directed by Coumadin clinic, Disp: 40 tablet, Rfl: 3 No Known Allergies   Social History   Socioeconomic History   Marital status: Married    Spouse name: Not on file   Number of children: 0   Years of education: Not on file   Highest education level: Not on file  Occupational History    Occupation: unemployed  Tobacco Use   Smoking status: Never   Smokeless tobacco: Never  Vaping Use   Vaping Use: Not on file  Substance and Sexual Activity   Alcohol use: Not Currently    Comment: "occasional" when "hanging out with the wrong people" No recent use.   Drug use: Not Currently    Types: Marijuana    Comment: occasional, last 2013   Sexual activity: Not Currently  Other Topics Concern   Not on file  Social History Narrative   Pt lives at home with his wife    Right handed    Caffeine- hardly any   Social Determinants of Health   Financial Resource Strain: High Risk   Difficulty of Paying Living Expenses: Hard  Food Insecurity: No Food Insecurity   Worried About Running Out of  Food in the Last Year: Never true   Ran Out of Food in the Last Year: Never true  Transportation Needs: Unmet Transportation Needs   Lack of Transportation (Medical): No   Lack of Transportation (Non-Medical): Yes  Physical Activity: Not on file  Stress: Not on file  Social Connections: Not on file  Intimate Partner Violence: Not on file    Physical Exam Vitals reviewed.  Constitutional:      Appearance: Normal appearance. He is normal weight.  HENT:     Head: Normocephalic.     Nose: Nose normal.     Mouth/Throat:     Mouth: Mucous membranes are moist.     Pharynx: Oropharynx is clear.  Eyes:     Conjunctiva/sclera: Conjunctivae normal.     Pupils: Pupils are equal, round, and reactive to light.  Cardiovascular:     Rate and Rhythm: Normal rate and regular rhythm.     Pulses: Normal pulses.     Heart sounds: Normal heart sounds.  Pulmonary:     Effort: Pulmonary effort is normal.     Breath sounds: Normal breath sounds.  Abdominal:     General: Abdomen is flat.     Palpations: Abdomen is soft.  Musculoskeletal:        General: No swelling. Normal range of motion.     Cervical back: Normal range of motion.     Right lower leg: No edema.     Left lower leg: No edema.   Skin:    General: Skin is warm and dry.     Capillary Refill: Capillary refill takes less than 2 seconds.  Neurological:     General: No focal deficit present.     Mental Status: He is alert. Mental status is at baseline.  Psychiatric:        Mood and Affect: Mood normal.        Future Appointments  Date Time Provider [place]  04/13/2021  8:15 AM [facility]  04/19/2021  8:20 AM Philemon Kingdom, MD [facility] None  04/20/2021 10:45 AM Kirsteins, Luanna Salk, MD [facility]  05/11/2021  4:00 PM Vickie Epley, MD [facility]  05/16/2021  9:30 AM Tresa Endo, RPH-CPP [facility] None  06/22/2021 11:20 AM Larey Dresser, MD [facility] None  06/26/2021  9:30 AM Penumalli, Earlean Polka, MD [facility] None     ACTION: Home visit completed

## 2021-04-10 NOTE — Telephone Encounter (Signed)
° °  Ride Scheduled! Ride ID:  [id number]  Transportation Type:  RIDESHARE (UBER/LYFT)  Pickup Date/Time:  04/13/21 at 7:34 AM (EST)  (Appointment Time: 8:15 AM (EST))  [facility]  [address] [place], [place] [address], [place]   Drop-off Address  [address] [place], [place] [address], [place]

## 2021-04-11 ENCOUNTER — Telehealth: Payer: Self-pay | Admitting: Internal Medicine

## 2021-04-11 DIAGNOSIS — E1142 Type 2 diabetes mellitus with diabetic polyneuropathy: Secondary | ICD-10-CM

## 2021-04-11 MED ORDER — PREGABALIN 100 MG PO CAPS
100.0000 mg | ORAL_CAPSULE | Freq: Three times a day (TID) | ORAL | 5 refills | Status: DC
Start: 2021-04-11 — End: 2021-04-20

## 2021-04-11 NOTE — Telephone Encounter (Signed)
-----   Message from Maralyn Sago, EMT sent at 04/10/2021  5:00 PM EST ----- Regarding: med Please send Lyrica to [facility] for Mr. Beamer at your earliest convenience. Thanks.

## 2021-04-12 ENCOUNTER — Telehealth (HOSPITAL_COMMUNITY): Payer: Self-pay

## 2021-04-12 NOTE — Telephone Encounter (Signed)
°  RIDESHARE (UBER/LYFT)  Pickup Date/Time:  04/19/21 at 7:35 AM (EST)  (Appointment Time: 8:15 AM (EST))  [facility]  [address] [place], [place] [address], [place] Drop-off Address  [address] [place], [place] [address], [place]       Transportation Type:  Beatrix Fetters (UBER/LYFT)  Pickup Date/Time:  04/20/21 at 10:05 AM (EST)  (Appointment Time: 10:45 AM (EST))  [facility]  [address] [place], [place] [address], [place] Drop-off Address  [address] [place], [place] [address], [place]

## 2021-04-13 ENCOUNTER — Ambulatory Visit (INDEPENDENT_AMBULATORY_CARE_PROVIDER_SITE_OTHER): Payer: Medicaid Other

## 2021-04-13 ENCOUNTER — Other Ambulatory Visit: Payer: Self-pay

## 2021-04-13 DIAGNOSIS — I513 Intracardiac thrombosis, not elsewhere classified: Secondary | ICD-10-CM | POA: Diagnosis not present

## 2021-04-13 DIAGNOSIS — Z7901 Long term (current) use of anticoagulants: Secondary | ICD-10-CM | POA: Diagnosis not present

## 2021-04-13 DIAGNOSIS — Z5181 Encounter for therapeutic drug level monitoring: Secondary | ICD-10-CM

## 2021-04-13 DIAGNOSIS — I639 Cerebral infarction, unspecified: Secondary | ICD-10-CM | POA: Diagnosis not present

## 2021-04-13 LAB — POCT INR: INR: 3 (ref 2.0–3.0)

## 2021-04-13 NOTE — Patient Instructions (Signed)
-  continue taking 1 tablet daily except for 1.5 tablets on Sunday, Tuesdays, Thursdays and Saturdays.  - Keep 2 leafy veggies in your diet weekly.  - Recheck in 2 weeks  Coumadin Clinic [phone].

## 2021-04-17 ENCOUNTER — Other Ambulatory Visit (HOSPITAL_COMMUNITY): Payer: Self-pay

## 2021-04-17 NOTE — Progress Notes (Signed)
Paramedicine Encounter    Patient ID: Jason Stevenson, male    DOB: 11-Feb-1986, 36 y.o.   MRN: [id number]  Arrived for home visit where Jason Stevenson was seated alert and oriented reporting to be feeling okay stating he has had no shortness of breath, or chest pain. He did report some tremors in right hand, and an episode of feeling "faint". He stated at the time of this episode he checked his vitals and they were "okay".   I obtained assessment and vitals today and they are as noted. Weight up one pound this week, no swelling noted.   We reviewed pill box as his wife filled same. I checked same for accuracy and it was correct.   Appointments were confirmed and transportation confirmed.   CBG- 216 (before a meal and no insulin) He reports he had snack cakes last night before bed and did not take his night dose of insulin. We discussed diabetic management and he understood. He see's endo this week.   Home visit complete. I will see Jason Stevenson in one week.   Refills: NONE    Patient Care Team: Ladell Pier, MD as PCP - General (Internal Medicine) Josue Hector, MD as PCP - Cardiology (Cardiology) Vickie Epley, MD as PCP - Electrophysiology (Cardiology)  Patient Active Problem List   Diagnosis Date Noted   LADA (latent autoimmune diabetes in adults), managed as type 2 ([place]) 03/28/2021   Poorly controlled type 2 diabetes mellitus with circulatory disorder ([place]) 03/19/2021   Ischemic cardiomyopathy    Syncope 12/14/2020   Chest pain 10/30/2020   History of cerebrovascular accident (CVA) with residual deficit 10/27/2020   Protein-calorie malnutrition, severe 09/22/2020   Acute CVA (cerebrovascular accident) ([place]) 09/21/2020   Near syncope 09/21/2020   Encounter for monitoring Coumadin therapy 09/20/2020   LV (left ventricular) mural thrombus 09/13/2020   Seizure ([place]) 09/13/2020   Neurological deficit present 09/12/2020   History of COVID-19 09/12/2020   Intracerebral  hemorrhage 09/12/2020   Acute cerebrovascular accident (CVA) ([place]) 09/12/2020   COVID-19 08/23/2020   Noncompliance with medication treatment due to intermittent use of medication 10/12/2018   Dyslipidemia 02/77/4128   Chronic systolic CHF (congestive heart failure) ([place]) 06/18/2015   Needs flu shot 12/23/2013   Non-ischemic cardiomyopathy ([place]) 11/06/2012   HTN (hypertension) 11/06/2012   HLD (hyperlipidemia) 11/06/2012    Current Outpatient Medications:    Accu-Chek Softclix Lancets lancets, Use to check blood sugar three times daily., Disp: 100 each, Rfl: 3   acetaminophen (TYLENOL) 500 MG tablet, Take 1,000 mg by mouth every 8 (eight) hours as needed for mild pain, fever or headache., Disp: , Rfl:    atorvastatin (LIPITOR) 80 MG tablet, Take 1 tablet (80 mg total) by mouth daily., Disp: 30 tablet, Rfl: 2   Blood Glucose Monitoring Suppl (ACCU-CHEK GUIDE) w/Device KIT, Use to check blood sugar three times daily., Disp: 1 kit, Rfl: 0   Blood Pressure Monitor DEVI, Use as directed to check home blood pressure 2-3 times a week, Disp: 1 Device, Rfl: 0   dapagliflozin propanediol (FARXIGA) 5 MG TABS tablet, Take 1 tablet (5 mg total) by mouth daily before breakfast., Disp: 30 tablet, Rfl: 3   digoxin (LANOXIN) 0.125 MG tablet, Take 1 tablet (0.125 mg total) by mouth daily., Disp: 30 tablet, Rfl: 2   DULoxetine (CYMBALTA) 20 MG capsule, Take 1 capsule (20 mg total) by mouth daily., Disp: 30 capsule, Rfl: 3   Evolocumab (REPATHA SURECLICK) 786 MG/ML SOAJ, Inject 1 pen  into the skin every 14 (fourteen) days., Disp: 2 mL, Rfl: 11   ezetimibe (ZETIA) 10 MG tablet, Take 1 tablet (10 mg total) by mouth daily., Disp: 30 tablet, Rfl: 2   furosemide (LASIX) 20 MG tablet, Take 1 tablet (20 mg total) by mouth daily., Disp: 30 tablet, Rfl: 3   glucose blood (ACCU-CHEK GUIDE) test strip, Use to check blood sugar three times daily., Disp: 100 each, Rfl: 2   ibuprofen (ADVIL) 600 MG tablet, Take 600 mg by  mouth every 6 (six) hours as needed., Disp: , Rfl:    insulin isophane & regular human KwikPen (NOVOLIN 70/30 KWIKPEN) (70-30) 100 UNIT/ML KwikPen, 22 units SQ Q a.m and 16 units Q p.m, Disp: 15 mL, Rfl: 5   Insulin Pen Needle 32G X 4 MM MISC, Use to inject insulin., Disp: 200 each, Rfl: 2   Insulin Syringe-Needle U-100 (RELION INSULIN SYRINGE) 31G X 15/64" 0.3 ML MISC, Use to inject insulin daily., Disp: 100 each, Rfl: 11   ivabradine (CORLANOR) 7.5 MG TABS tablet, Take 1 tablet (7.5 mg total) by mouth 2 (two) times daily with a meal., Disp: 60 tablet, Rfl:    levETIRAcetam (KEPPRA) 750 MG tablet, Take 2 tablets (1,500 mg total) by mouth 2 (two) times daily., Disp: 120 tablet, Rfl: 3   Menthol, Topical Analgesic, (ICY HOT BACK EX), Apply 1 application topically as needed (neuropathy). Icy hot cream, Disp: , Rfl:    metFORMIN (GLUCOPHAGE) 500 MG tablet, Take 1 tablet (500 mg total) by mouth 2 (two) times daily with a meal., Disp: 60 tablet, Rfl: 2   metoprolol (TOPROL-XL) 200 MG 24 hr tablet, Take 0.5 tablets (100 mg total) by mouth daily. Take with or immediately following a meal., Disp: 30 tablet, Rfl: 2   Oxcarbazepine (TRILEPTAL) 300 MG tablet, Take 2 tablets (600 mg total) by mouth 2 (two) times daily., Disp: 120 tablet, Rfl: 3   phenytoin (DILANTIN) 100 MG ER capsule, Take 1 capsule (100 mg total) by mouth 3 (three) times daily., Disp: 90 capsule, Rfl: 5   pregabalin (LYRICA) 100 MG capsule, Take 1 capsule (100 mg total) by mouth 3 (three) times daily., Disp: 90 capsule, Rfl: 5   sacubitril-valsartan (ENTRESTO) 24-26 MG, Take 1 tablet by mouth 2 (two) times daily., Disp: 60 tablet, Rfl: 0   spironolactone (ALDACTONE) 25 MG tablet, Take 1 tablet (25 mg total) by mouth at bedtime., Disp: 90 tablet, Rfl: 3   warfarin (COUMADIN) 5 MG tablet, Take 1-1.5 tablets (5-7.5 mg total) by mouth daily as directed by Coumadin clinic, Disp: 40 tablet, Rfl: 3 No Known Allergies   Social History    Socioeconomic History   Marital status: Married    Spouse name: Not on file   Number of children: 0   Years of education: Not on file   Highest education level: Not on file  Occupational History   Occupation: unemployed  Tobacco Use   Smoking status: Never   Smokeless tobacco: Never  Vaping Use   Vaping Use: Not on file  Substance and Sexual Activity   Alcohol use: Not Currently    Comment: "occasional" when "hanging out with the wrong people" No recent use.   Drug use: Not Currently    Types: Marijuana    Comment: occasional, last 2013   Sexual activity: Not Currently  Other Topics Concern   Not on file  Social History Narrative   Pt lives at home with his wife    Right handed  Caffeine- hardly any   Social Determinants of Health   Financial Resource Strain: High Risk   Difficulty of Paying Living Expenses: Hard  Food Insecurity: No Food Insecurity   Worried About Charity fundraiser in the Last Year: Never true   Ran Out of Food in the Last Year: Never true  Transportation Needs: Unmet Transportation Needs   Lack of Transportation (Medical): No   Lack of Transportation (Non-Medical): Yes  Physical Activity: Not on file  Stress: Not on file  Social Connections: Not on file  Intimate Partner Violence: Not on file    Physical Exam Vitals reviewed.  Constitutional:      Appearance: Normal appearance. He is normal weight.  HENT:     Head: Normocephalic.     Nose: Nose normal.     Mouth/Throat:     Mouth: Mucous membranes are moist.     Pharynx: Oropharynx is clear.  Eyes:     Conjunctiva/sclera: Conjunctivae normal.     Pupils: Pupils are equal, round, and reactive to light.  Cardiovascular:     Rate and Rhythm: Normal rate and regular rhythm.     Pulses: Normal pulses.     Heart sounds: Normal heart sounds.  Pulmonary:     Effort: Pulmonary effort is normal.     Breath sounds: Normal breath sounds.  Abdominal:     General: Abdomen is flat.      Palpations: Abdomen is soft.  Musculoskeletal:        General: No swelling. Normal range of motion.     Cervical back: Normal range of motion.     Right lower leg: No edema.     Left lower leg: No edema.  Skin:    General: Skin is warm and dry.     Capillary Refill: Capillary refill takes less than 2 seconds.  Neurological:     General: No focal deficit present.     Mental Status: He is alert. Mental status is at baseline.  Psychiatric:        Mood and Affect: Mood normal.        Future Appointments  Date Time Provider [place]  04/19/2021  8:20 AM Philemon Kingdom, MD [facility] None  04/20/2021 10:45 AM Kirsteins, Luanna Salk, MD [facility]  04/27/2021  8:45 AM [facility]  05/11/2021  4:00 PM Vickie Epley, MD [facility]  05/16/2021  9:30 AM Tresa Endo, RPH-CPP [facility] None  06/22/2021 11:20 AM Larey Dresser, MD [facility] None  06/26/2021  9:30 AM Penumalli, Earlean Polka, MD [facility] None     ACTION: Home visit completed

## 2021-04-19 ENCOUNTER — Other Ambulatory Visit (HOSPITAL_COMMUNITY): Payer: Self-pay

## 2021-04-19 ENCOUNTER — Other Ambulatory Visit: Payer: Self-pay

## 2021-04-19 ENCOUNTER — Telehealth: Payer: Self-pay | Admitting: Internal Medicine

## 2021-04-19 ENCOUNTER — Telehealth: Payer: Self-pay

## 2021-04-19 ENCOUNTER — Ambulatory Visit (INDEPENDENT_AMBULATORY_CARE_PROVIDER_SITE_OTHER): Payer: Medicaid Other | Admitting: Internal Medicine

## 2021-04-19 ENCOUNTER — Encounter: Payer: Self-pay | Admitting: Internal Medicine

## 2021-04-19 VITALS — BP 90/64 | HR 83 | Ht 71.0 in | Wt 164.6 lb

## 2021-04-19 DIAGNOSIS — E139 Other specified diabetes mellitus without complications: Secondary | ICD-10-CM | POA: Diagnosis not present

## 2021-04-19 DIAGNOSIS — E785 Hyperlipidemia, unspecified: Secondary | ICD-10-CM | POA: Diagnosis not present

## 2021-04-19 DIAGNOSIS — E1169 Type 2 diabetes mellitus with other specified complication: Secondary | ICD-10-CM

## 2021-04-19 DIAGNOSIS — E1369 Other specified diabetes mellitus with other specified complication: Secondary | ICD-10-CM | POA: Diagnosis not present

## 2021-04-19 LAB — HEMOGLOBIN A1C: Hgb A1c MFr Bld: 7.7 % — ABNORMAL HIGH (ref 4.6–6.5)

## 2021-04-19 MED ORDER — FREESTYLE LIBRE 2 READER DEVI: 1.0000 | Freq: Every day | 0 refills | Status: DC

## 2021-04-19 MED ORDER — TRESIBA FLEXTOUCH 200 UNIT/ML ~~LOC~~ SOPN: 20.0000 [IU] | PEN_INJECTOR | Freq: Every day | SUBCUTANEOUS | 3 refills | Status: DC

## 2021-04-19 MED ORDER — FREESTYLE LIBRE 2 SENSOR MISC: 1.0000 | 3 refills | Status: DC

## 2021-04-19 MED ORDER — FIASP FLEXTOUCH 100 UNIT/ML ~~LOC~~ SOPN: 5.0000 [IU] | PEN_INJECTOR | Freq: Three times a day (TID) | SUBCUTANEOUS | 3 refills | Status: DC

## 2021-04-19 NOTE — Progress Notes (Signed)
Patient ID: Jason Stevenson, male   DOB: Mar 30, 1986, 36 y.o.   MRN: [id number]  This visit occurred during the SARS-CoV-2 public health emergency.  Safety protocols were in place, including screening questions prior to the visit, additional usage of staff PPE, and extensive cleaning of exam room while observing appropriate contact time as indicated for disinfecting solutions.   HPI: Jason Stevenson is a 36 y.o.-year-old male, initially referred by his cardiologist, Dr. Aundra Dubin, returning for follow-up for DM, dx in 2011, but as LADA in 2022, insulin-dependent, uncontrolled, with complications (ischemic cardiomyopathy complicated by systolic CHF, history of CVA -after Covid19 infection, peripheral neuropathy).  Last visit 1 month ago.  Interim history: No increased urination, nausea, chest pain.  He does have blurry vision at distance, which is not much changed from before. He did lose 8 pounds since last visit.  Reviewed HbA1c: Lab Results  Component Value Date   HGBA1C 8.2 (H) 01/29/2021   HGBA1C 8.3 (H) 12/14/2020   HGBA1C >15.5 (H) 09/12/2020   HGBA1C >15.5 (H) 11/26/2019   HGBA1C 14.2 (A) 04/26/2019   HGBA1C 13.8 (A) 01/18/2019   HGBA1C 13.7 (A) 10/12/2018   HGBA1C 12.4 (H) 07/02/2018   HGBA1C 11.6 08/12/2017   HGBA1C 9.1 05/14/2017   PCP recently checked his antipancreatic antibodies: Component     Latest Ref Rng & Units 02/05/2021  Glutamic Acid Decarb Ab     0.0 - 5.0 U/mL 20.6 (H)  Islet Cell Ab     Neg:<1:1 Negative   At last visit, he was on: - Metformin 500 mg 2x a day, with meals - Novolin 70/30  22 units before breakfast 16 units before, during dinner, or at bedtime  We changed to: - Metformin 500 mg 2x a day, with meals - Farxiga 5 mg before b'fast - Novolin 70/30 30 min before a meal 22-25 units before breakfast 14-16 units before dinner   Pt checks his sugars 2x a day: - am: 68-192, 212 (no insulin) >>  102-194, 216 (missed insulin) - 2h after b'fast:  302 in the office >> 152, 281 - before lunch: 95-141 >> 174 - 2h after lunch: 241 >> 73, 162 - before dinner: 57, 331 >> 57, 125-174 - 2h after dinner: n/c >> 145-166 - bedtime: 187-312 >> 115-153, 240 - nighttime: n/c >> 241 Lowest sugar was 51 >> 57; he has hypoglycemia awareness at 70.  Highest sugar was 321 >> 281.  Glucometer:TrueMetrix  Pt's meals are: - Breakfast: fruit (including banana), PB, toast, pancakes - Lunch: pasta, stir-fry with protein, sandwiches - Dinner: varies - Snacks: fruit, sometimes chips  - no CKD, last BUN/creatinine:  Lab Results  Component Value Date   BUN 13 04/03/2021   BUN 14 03/13/2021   CREATININE 0.85 04/03/2021   CREATININE 0.84 03/13/2021  On Entresto 24/26.  -+ HL; last set of lipids: Lab Results  Component Value Date   CHOL 153 12/04/2020   HDL 49 12/04/2020   LDLCALC 81 12/04/2020   TRIG 115 12/04/2020   CHOLHDL 3.1 12/04/2020  On atorvastatin 80 mg daily, ezetimibe 10 mg daily. Also, Repatha.  - last eye exam was in 2018. No DR. now that he got insurance, he is planning to schedule this.  - + numbness and tingling in his feet. He was currently referred to a pain clinic and also started on Lyrica, Cymbalta.  Pt has FH of DM in mother and sister.  He also has HTN.  ROS: + see HPI  Past Medical History:  Diagnosis Date   Chronic systolic CHF (congestive heart failure) ([place]) 06/18/2015   Diabetes mellitus without complication ([place])    Hemorrhagic stroke ([place])    Hyperlipidemia    Hypertension    Nonischemic cardiomyopathy ([place]) Noted as early as 2007   Per chart review (cards consult note 2011), EF of 40% in 2007, down to 20-25% in 2011   Past Surgical History:  Procedure Laterality Date   None     RIGHT/LEFT HEART CATH AND CORONARY ANGIOGRAPHY N/A 11/02/2020   Procedure: RIGHT/LEFT HEART CATH AND CORONARY ANGIOGRAPHY;  Surgeon: Larey Dresser, MD;  Location: [facility];  Service: Cardiovascular;  Laterality:  N/A;   Social History   Socioeconomic History   Marital status: Married    Spouse name: Not on file   Number of children: 0   Years of education: Not on file   Highest education level: Not on file  Occupational History   Occupation: unemployed  Tobacco Use   Smoking status: Never   Smokeless tobacco: Never  Vaping Use   Vaping Use: Not on file  Substance and Sexual Activity   Alcohol use: Not Currently    Comment: "occasional" when "hanging out with the wrong people" No recent use.   Drug use: Not Currently    Types: Marijuana    Comment: occasional, last 2013   Sexual activity: Not Currently  Other Topics Concern   Not on file  Social History Narrative   Pt lives at home with his wife    Right handed    Caffeine- hardly any   Social Determinants of Health   Financial Resource Strain: High Risk   Difficulty of Paying Living Expenses: Hard  Food Insecurity: No Food Insecurity   Worried About Charity fundraiser in the Last Year: Never true   Ran Out of Food in the Last Year: Never true  Transportation Needs: Unmet Transportation Needs   Lack of Transportation (Medical): No   Lack of Transportation (Non-Medical): Yes  Physical Activity: Not on file  Stress: Not on file  Social Connections: Not on file  Intimate Partner Violence: Not on file   Current Outpatient Medications on File Prior to Visit  Medication Sig Dispense Refill   Accu-Chek Softclix Lancets lancets Use to check blood sugar three times daily. 100 each 3   acetaminophen (TYLENOL) 500 MG tablet Take 1,000 mg by mouth every 8 (eight) hours as needed for mild pain, fever or headache.     atorvastatin (LIPITOR) 80 MG tablet Take 1 tablet (80 mg total) by mouth daily. 30 tablet 2   Blood Glucose Monitoring Suppl (ACCU-CHEK GUIDE) w/Device KIT Use to check blood sugar three times daily. 1 kit 0   Blood Pressure Monitor DEVI Use as directed to check home blood pressure 2-3 times a week 1 Device 0    dapagliflozin propanediol (FARXIGA) 5 MG TABS tablet Take 1 tablet (5 mg total) by mouth daily before breakfast. 30 tablet 3   digoxin (LANOXIN) 0.125 MG tablet Take 1 tablet (0.125 mg total) by mouth daily. 30 tablet 2   DULoxetine (CYMBALTA) 20 MG capsule Take 1 capsule (20 mg total) by mouth daily. 30 capsule 3   Evolocumab (REPATHA SURECLICK) 957 MG/ML SOAJ Inject 1 pen into the skin every 14 (fourteen) days. 2 mL 11   ezetimibe (ZETIA) 10 MG tablet Take 1 tablet (10 mg total) by mouth daily. 30 tablet 2   furosemide (LASIX) 20 MG tablet Take 1 tablet (20  mg total) by mouth daily. 30 tablet 3   glucose blood (ACCU-CHEK GUIDE) test strip Use to check blood sugar three times daily. 100 each 2   ibuprofen (ADVIL) 600 MG tablet Take 600 mg by mouth every 6 (six) hours as needed.     insulin isophane & regular human KwikPen (NOVOLIN 70/30 KWIKPEN) (70-30) 100 UNIT/ML KwikPen 22 units SQ Q a.m and 16 units Q p.m 15 mL 5   Insulin Pen Needle 32G X 4 MM MISC Use to inject insulin. 200 each 2   Insulin Syringe-Needle U-100 (RELION INSULIN SYRINGE) 31G X 15/64" 0.3 ML MISC Use to inject insulin daily. 100 each 11   ivabradine (CORLANOR) 7.5 MG TABS tablet Take 1 tablet (7.5 mg total) by mouth 2 (two) times daily with a meal. 60 tablet    levETIRAcetam (KEPPRA) 750 MG tablet Take 2 tablets (1,500 mg total) by mouth 2 (two) times daily. 120 tablet 3   Menthol, Topical Analgesic, (ICY HOT BACK EX) Apply 1 application topically as needed (neuropathy). Icy hot cream     metFORMIN (GLUCOPHAGE) 500 MG tablet Take 1 tablet (500 mg total) by mouth 2 (two) times daily with a meal. 60 tablet 2   metoprolol (TOPROL-XL) 200 MG 24 hr tablet Take 0.5 tablets (100 mg total) by mouth daily. Take with or immediately following a meal. 30 tablet 2   Oxcarbazepine (TRILEPTAL) 300 MG tablet Take 2 tablets (600 mg total) by mouth 2 (two) times daily. 120 tablet 3   phenytoin (DILANTIN) 100 MG ER capsule Take 1 capsule (100 mg  total) by mouth 3 (three) times daily. 90 capsule 5   pregabalin (LYRICA) 100 MG capsule Take 1 capsule (100 mg total) by mouth 3 (three) times daily. 90 capsule 5   sacubitril-valsartan (ENTRESTO) 24-26 MG Take 1 tablet by mouth 2 (two) times daily. 60 tablet 0   spironolactone (ALDACTONE) 25 MG tablet Take 1 tablet (25 mg total) by mouth at bedtime. 90 tablet 3   warfarin (COUMADIN) 5 MG tablet Take 1-1.5 tablets (5-7.5 mg total) by mouth daily as directed by Coumadin clinic 40 tablet 3   No current facility-administered medications on file prior to visit.   No Known Allergies Family History  Problem Relation Age of Onset   Stroke Mother    Diabetes Mother    Hypertension Mother    Stroke Maternal Aunt    Heart attack Neg Hx    PE: BP 90/64 (BP Location: Right Arm, Patient Position: Sitting, Cuff Size: Normal)    Pulse 83    Ht '5\' 11"'  (1.803 m)    Wt 164 lb 9.6 oz (74.7 kg)    SpO2 96%    BMI 22.96 kg/m  Wt Readings from Last 3 Encounters:  04/19/21 164 lb 9.6 oz (74.7 kg)  04/17/21 155 lb 12.8 oz (70.7 kg)  04/10/21 154 lb (69.9 kg)   Constitutional: normal weight, in NAD Eyes: PERRLA, EOMI, no exophthalmos ENT: moist mucous membranes, no thyromegaly, no cervical lymphadenopathy Cardiovascular: RRR, No MRG Respiratory: CTA B Musculoskeletal: no deformities, strength intact in all 4 Skin: moist, warm, no rashes Neurological: no tremor with outstretched hands, DTR normal in all 4  ASSESSMENT: 1. LADA, insulin-dependent, uncontrolled, with complications - iCMP, systolic CHF, with LV thrombus - on Coumadin - h/o CVA 08/2020 - PN  2. HL  PLAN:  1. Patient with longstanding, uncontrolled, insulin-dependent diabetes, whom I first saw a month ago.  At that time, he was on a  premixed insulin regimen and also metformin.  HbA1c was 8.2%, improved from undetectably high in 08/2020 when he had a stroke.  He mentions that before his stroke he was noncompliant with his diabetic  regimen.  Afterwards, he made an effort to take his medications consistently and check blood sugars and write them down.  He was doing a great job with this.  At last visit, we discussed about the fact we needed to check him for type 1 diabetes but we could not do so due to high blood sugar at the time of the appointment (307).  However, since then, PCP checked him for this and GAD antibodies are positive.  Islet cell antibodies are negative.  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  This can display both type I and type II diabetic features.  At today's visit, we will check his insulin production. -At last visit, we continued his 70/30 premixed insulin regimen (he previously could not afford other insulins, but since then, he was approved for Medicaid).  We discussed that this was not a very flexible regimen but at that time, we decided to adjust the dose and add an SGLT2 inhibitor (due to his CHF) pending this appointment to see if we can change to a basal-bolus insulin regimen, now in the new year. -At today's visit, his blood sugars have improved significantly.  His lowest sugar was 57 on Christmas Day.  He is not sure what happened.  He does occasionally miss breakfast. He also lost 8 pounds. -At this visit, we discussed about switching from the premixed insulin regimen to [place].  I am hoping that these are covered by his insurance but we discussed that if not, we can work with any of the analog long and rapid acting insulins.  Since he is using approximately 40 units of insulin a day, will give him 20 units of Tresiba U200 and 5 to 8 units of Fiasp right before meals.  Discussed about how to vary the dose based on the size and consistency of his meals. -He was not able to obtain the CGM but he would like me to call this into his pharmacy.  I did this today. - I suggested to:  Patient Instructions  Please continue: - Metformin 500 mg 2x a  day, with meals - Farxiga 5 mg before b'fast  Stop Novolin 70/30 and start:  - Tresiba 20 units daily  - FiAsp 5-8 units before each meal  Try to get the CGM.  Please return in 3 months.  - we would check his HbA1c today - advised to check sugars at different times of the day - 4x a day, rotating check times - advised for yearly eye exams >> he is UTD - return to clinic in 3 months  2. HL - Reviewed latest lipid panel from 11/2020: LDL above our target of less than 70 due to cardiovascular disease: Lab Results  Component Value Date   CHOL 153 12/04/2020   HDL 49 12/04/2020   LDLCALC 81 12/04/2020   TRIG 115 12/04/2020   CHOLHDL 3.1 12/04/2020  -He is on atorvastatin 80 mg daily and Zetia 10 mg daily without side effects. Also, Repatha.  Component     Latest Ref Rng & Units 04/19/2021  C-Peptide     0.80 - 3.85 ng/mL 0.49 (L)  Glucose     65 - 99 mg/dL 78  Hemoglobin A1C  4.6 - 6.5 % 7.7 (H)  HbA1c has improved.  His C-peptide is low, confirming insulin deficiency >> he has a diagnosis of LADA, behaving more like type 1 diabetes.  Philemon Kingdom, MD PhD [facility]

## 2021-04-19 NOTE — Patient Instructions (Addendum)
Please continue: - Metformin 500 mg 2x a day, with meals - Farxiga 5 mg before b'fast  Stop Novolin 70/30 and start:  - Tresiba 20 units daily  - FiAsp 5-8 units before each meal  Try to get the CGM.  Please return in 3 months.

## 2021-04-19 NOTE — Telephone Encounter (Signed)
Patient called re: [facility] told Patient to have Dr. Cruzita Lederer call Medicaid in order for Patient to receive the following medications:insulin aspart (FIASP FLEXTOUCH) 100 UNIT/ML FlexTouch Pen, insulin degludec (TRESIBA FLEXTOUCH) 200 UNIT/ML FlexTouch Pen, Continuous Blood Gluc Receiver (FREESTYLE LIBRE 2 READER) DEVI   AND Continuous Blood Gluc Sensor (FREESTYLE LIBRE 2 SENSOR) MISC  Patient's ph# [phone] (If questions)

## 2021-04-19 NOTE — Telephone Encounter (Signed)
Patient Advocate Encounter   Received notification from [facility] Tracks that prior authorization for Jason Stevenson is required by his/her insurance [place] [employer].   PA submitted on 04/19/21  [place] Tracks #: [id number]  Status is pending    [facility] will continue to follow:  Patient Advocate Fax:  [phone]

## 2021-04-19 NOTE — Telephone Encounter (Signed)
Patient Advocate Encounter   Received notification from [facility] Tracks that prior authorization for Jason Stevenson is required by his/her insurance [place] Medicaid.   PA submitted on 04/19/21  [place] Tracks #: [id number] W  Status is pending    [facility] will continue to follow:  Patient Advocate Fax:  [phone]

## 2021-04-19 NOTE — Telephone Encounter (Signed)
Patient Advocate Encounter  Prior Authorization for [employer] 2 sensors has been approved.    [place] Tracks #: [id number]  Effective dates: 04/19/21 through 04/19/22  Per Test Claim Patients co-pay is $0.   Spoke with Pharmacy to Process.  Patient Advocate Fax: [phone]

## 2021-04-20 ENCOUNTER — Other Ambulatory Visit: Payer: Self-pay

## 2021-04-20 ENCOUNTER — Encounter: Payer: Medicaid Other | Attending: Physical Medicine & Rehabilitation | Admitting: Physical Medicine & Rehabilitation

## 2021-04-20 ENCOUNTER — Encounter: Payer: Self-pay | Admitting: Internal Medicine

## 2021-04-20 ENCOUNTER — Encounter: Payer: Self-pay | Admitting: Physical Medicine & Rehabilitation

## 2021-04-20 DIAGNOSIS — Z794 Long term (current) use of insulin: Secondary | ICD-10-CM | POA: Diagnosis not present

## 2021-04-20 DIAGNOSIS — E1142 Type 2 diabetes mellitus with diabetic polyneuropathy: Secondary | ICD-10-CM

## 2021-04-20 LAB — GLUCOSE, FASTING: Glucose, Bld: 78 mg/dL (ref 65–99)

## 2021-04-20 LAB — C-PEPTIDE: C-Peptide: 0.49 ng/mL — ABNORMAL LOW (ref 0.80–3.85)

## 2021-04-20 MED ORDER — DULOXETINE HCL 30 MG PO CPEP
30.0000 mg | ORAL_CAPSULE | Freq: Every day | ORAL | 2 refills | Status: DC
  Filled 2021-04-20: qty 30, 30d supply, fill #0

## 2021-04-20 MED ORDER — PREGABALIN 300 MG PO CAPS: 300.0000 mg | ORAL_CAPSULE | Freq: Every day | ORAL | 5 refills | Status: DC

## 2021-04-20 MED ORDER — DULOXETINE HCL 30 MG PO CPEP: 30.0000 mg | ORAL_CAPSULE | Freq: Every day | ORAL | 2 refills | Status: DC

## 2021-04-20 MED ORDER — PREGABALIN 300 MG PO CAPS
300.0000 mg | ORAL_CAPSULE | Freq: Every day | ORAL | 5 refills | Status: DC
  Filled 2021-04-20: qty 30, 30d supply, fill #0

## 2021-04-20 NOTE — Progress Notes (Signed)
Subjective:    Patient ID: Jason Stevenson, male    DOB: February 13, 1986, 36 y.o.   MRN: [id number]  HPI 36 yo male with h severe cardiomyopathy , DM, seizure d/o as well as embolic CVA who is referred by primary care for the evaluation of painful diabetic neuropathy.  He has been hospitalized for elevated blood sugars of 600 in September 2022.  He is now seeing Dr. Lafe Garin endocrinology to help with management.  He plans to have a transcutaneous blood sugar monitor in place to him. Has some auditory aura but no actual seizures for several months.  He follows up with Dr. Marjory Lies from neurology. Diabetic neuropathy painful, L>R feet and R>L hand.  The patient indicates that his upper extremity pain mainly involves the little finger and the lower extremity pain mainly involves the great toe side of the foot. His pain is worse with walking and standing but also bothers him at night when he is trying to sleep.  He is able to climb steps he walks without assistive device.  He does need some assistance with dressing meal prep household duties and shopping which his wife provides.  Not driving due to seizure   Duloxetine  per day Keppra  BID Oxcarbazine  BID Phenytoin  TID Pregabalin  TID CLINICAL DATA:  Stroke follow-up.  Pt states wife assists him with getting his shoes on    EXAM: CT ANGIOGRAPHY HEAD AND NECK   TECHNIQUE: Multidetector CT imaging of the head and neck was performed using the standard protocol during bolus administration of intravenous contrast. Multiplanar CT image reconstructions and MIPs were obtained to evaluate the vascular anatomy. Carotid stenosis measurements (when applicable) are obtained utilizing NASCET criteria, using the distal internal carotid diameter as the denominator.   CONTRAST:  50mL OMNIPAQUE IOHEXOL 350 MG/ML SOLN   COMPARISON:  Head CT and MRI 09/11/2020   FINDINGS: CT HEAD FINDINGS   Brain: Hypodensity in the right  frontal operculum corresponds to the subacute infarct on MRI. The suspected small subacute left parieto-occipital infarct on MRI is not well demonstrated by CT. A chronic right parietal infarct is again noted. No acute intracranial hemorrhage, mass, midline shift, or extra-axial fluid collection is identified. The ventricles are normal in size.   Vascular: No hyperdense vessel.   Skull: No fracture suspicious osseous lesion.   Sinuses: Visualized paranasal sinuses and mastoid air cells are clear.   Orbits: Visualized portions of the orbits are unremarkable.   Review of the MIP images confirms the above findings   CTA NECK FINDINGS   Aortic arch: Standard 3 vessel aortic arch with widely patent arch vessel origins.   Right carotid system: Patent without evidence of stenosis, dissection, or significant atherosclerosis.   Left carotid system: Patent without evidence of stenosis, dissection, or significant atherosclerosis.   Vertebral arteries: Patent without evidence of stenosis, dissection, or significant atherosclerosis. Strongly dominant right vertebral artery.   Skeleton: No acute osseous abnormality or suspicious osseous lesion.   Other neck: No evidence of cervical lymphadenopathy or mass.   Upper chest: Clear lung apices.   Review of the MIP images confirms the above findings   CTA HEAD FINDINGS   Anterior circulation: The internal carotid arteries are widely patent from skull base to carotid termini. ACAs and MCAs are patent without evidence of a proximal branch occlusion or significant proximal stenosis. There is mild attenuation of distal right MCA branch vessels in the region of the chronic right parietal infarct. No aneurysm is identified.  Posterior circulation: The intracranial vertebral arteries are patent to the basilar with the left vertebral artery being markedly hypoplastic distal to the PICA origin. The left PICA and right AICA appear dominant.  Patent SCAs are seen bilaterally. The basilar artery is widely patent. There are patent posterior communicating arteries bilaterally. Both PCAs are patent without evidence of a significant proximal stenosis. No aneurysm is identified.   Venous sinuses: Patent.   Anatomic variants: None.   Review of the MIP images confirms the above findings   IMPRESSION: No large vessel occlusion or significant proximal stenosis in the head and neck.     Electronically Signed   By: Sebastian Ache M.D.   On: 09/12/2020 10:42 Pain Inventory Average Pain 8 Pain Right Now 8 My pain is constant, sharp, burning, dull, stabbing, tingling, and aching  In the last 24 hours, has pain interfered with the following? General activity 10 Relation with others 4 Enjoyment of life 10 What TIME of day is your pain at its worst? daytime and night Sleep (in general) Fair  Pain is worse with: walking, standing, some activites, and laying down at night Pain improves with: heat/ice, medication, and elevation Relief from Meds: 4  walk without assistance ability to climb steps?  yes do you drive?  no  has been on restriction related to seizures/ medication (6 month)   not employed: date last employed . disabled: date disabled pending I need assistance with the following:  dressing, bathing, meal prep, household duties, and shopping  bladder control problems bowel control problems weakness numbness tremor tingling trouble walking dizziness confusion depression anxiety  Any changes since last visit?  no  Primary care [name] MD    Family History  Problem Relation Age of Onset   Stroke Mother    Diabetes Mother    Hypertension Mother    Stroke Maternal Aunt    Heart attack Neg Hx    Social History   Socioeconomic History   Marital status: Married    Spouse name: Not on file   Number of children: 0   Years of education: Not on file   Highest education level: Not on file   Occupational History   Occupation: unemployed  Tobacco Use   Smoking status: Never   Smokeless tobacco: Never  Vaping Use   Vaping Use: Not on file  Substance and Sexual Activity   Alcohol use: Not Currently    Comment: "occasional" when "hanging out with the wrong people" No recent use.   Drug use: Not Currently    Types: Marijuana    Comment: occasional, last 2013   Sexual activity: Not Currently  Other Topics Concern   Not on file  Social History Narrative   Pt lives at home with his wife    Right handed    Caffeine- hardly any   Social Determinants of Health   Financial Resource Strain: High Risk   Difficulty of Paying Living Expenses: Hard  Food Insecurity: No Food Insecurity   Worried About Programme researcher, broadcasting/film/video in the Last Year: Never true   Ran Out of Food in the Last Year: Never true  Transportation Needs: Unmet Transportation Needs   Lack of Transportation (Medical): No   Lack of Transportation (Non-Medical): Yes  Physical Activity: Not on file  Stress: Not on file  Social Connections: Not on file   Past Surgical History:  Procedure Laterality Date   None     RIGHT/LEFT HEART CATH AND CORONARY ANGIOGRAPHY N/A 11/02/2020  Procedure: RIGHT/LEFT HEART CATH AND CORONARY ANGIOGRAPHY;  Surgeon: Laurey Morale, MD;  [facility] INV[facility]IVE CV LAB;  Service: Cardiovascular;  Laterality: N/A;   Past Medical History:  Diagnosis Date   Chronic systolic CHF (congestive heart failure) (HCC) 06/18/2015   Diabetes mellitus without complication (HCC)    Hemorrhagic stroke (HCC)    Hyperlipidemia    Hypertension    Nonischemic cardiomyopathy (HCC) Noted as early as 2007   Per chart review (cards consult note 2011), EF of 40% in 2007, down to 20-25% in 2011   BP 130/81    Pulse 82    Temp 98.4 F (36.9 C)    Ht 5\' 11"  (1.803 m)    Wt 159 lb 12.8 oz (72.5 kg)    SpO2 95%    BMI 22.29 kg/m   Opioid Risk Score:   Fall Risk Score:  `1  Depression screen PHQ  2/9  Depression screen [facility] 2/9 04/20/2021 02/05/2021 10/27/2020 04/26/2019 01/18/2019 08/12/2017 05/14/2017  Decreased Interest 1 1 0 0 0 0 0  Down, Depressed, Hopeless 1 1 1  0 0 0 0  PHQ - 2 Score 2 2 1  0 0 0 0  Altered sleeping 3 3 - - - - 0  Tired, decreased energy 2 1 - - - - 0  Change in appetite 2 3 - - - - 0  Feeling bad or failure about yourself  0 1 - - - - 0  Trouble concentrating 0 0 - - - - 0  Moving slowly or fidgety/restless 0 0 - - - - 0  Suicidal thoughts 0 0 - - - - 0  PHQ-9 Score 9 10 - - - - 0  Some recent data might be hidden     Review of Systems  Constitutional:  Positive for appetite change.       Poor  HENT: Negative.    Eyes: Negative.   Respiratory:  Positive for shortness of breath.   Cardiovascular:  Positive for leg swelling.  Gastrointestinal:  Positive for constipation and diarrhea.  Endocrine:       Blood sugar fluctuations  Genitourinary: Negative.   Musculoskeletal:  Positive for gait problem.  Skin: Negative.   Allergic/Immunologic: Negative.   Neurological:  Positive for dizziness, tremors, seizures, weakness and numbness.       Neuropathy of hands and feet/ tingling  Hematological:  Bruises/bleeds easily.       On coumadin  Psychiatric/Behavioral:  Positive for confusion and dysphoric mood. The patient is nervous/anxious.   All other systems reviewed and are negative.     Objective:   Physical Exam Vitals and nursing note reviewed.  Constitutional:      Appearance: He is normal weight.  HENT:     Head: Normocephalic and atraumatic.  Eyes:     Extraocular Movements: Extraocular movements intact.     Conjunctiva/sclera: Conjunctivae normal.     Pupils: Pupils are equal, round, and reactive to light.  Cardiovascular:     Pulses:          Dorsalis pedis pulses are 2+ on the right side and 2+ on the left side.  Musculoskeletal:     Right foot: Decreased range of motion. No deformity.     Left foot: Decreased range of motion. No  deformity.     Comments: Full range of motion bilateral upper and lower limbs.   Feet:     Right foot:     Skin integrity: Skin integrity normal.  Toenail Condition: Right toenails are normal.     Left foot:     Skin integrity: Skin integrity normal.     Toenail Condition: Left toenails are normal.  Neurological:     Mental Status: He is alert and oriented to person, place, and time.     Cranial Nerves: No dysarthria or facial asymmetry.     Sensory: Sensation is intact.     Motor: Tremor present. No weakness.     Coordination: Coordination is intact.     Gait: Gait is intact.  Psychiatric:        Attention and Perception: Attention normal.        Mood and Affect: Mood normal.        Speech: Speech is delayed.        Behavior: Behavior is slowed.        Thought Content: Thought content normal.        Cognition and Memory: Memory is impaired.        Judgment: Judgment normal.          Assessment & Plan:   1.  Painful diabetic neuropathy but no significant neurologic deficit in feet or hands.  The patient has an asymmetric pattern suggestive of mononeuropathy multiplex.  Deep peroneal as well as ulnar nerve involvement primarily.  He gets fair relief with duloxetine and is on a low dose will be increased from 20 to 30 mg. The patient is also taking Lyrica pains of sedation.  He is on multiple antiseizure medications.  Would recommend changing Lyrica to 300 mg nightly given his difficulty with sleep. I will see him back in 2 months if still not doing better would recommend Qutenza Patient with multiple comorbidities psychomotor slowing likely medication related although may be somewhat related to history of bilateral cardioembolic infarcts.

## 2021-04-20 NOTE — Patient Instructions (Signed)
Change Lyrica to 300mg  at night (That is 3 of your 100mg  dose) I wrote a new prescription for 300mg  to take 1 at night once you run out of 100mg  tablets  Have increased cymbalta to 30mg  per day

## 2021-04-20 NOTE — Telephone Encounter (Deleted)
Patient Advocate Encounter  Received notification from [facility] Tracks that the request for prior authorization for [employer] has been denied.   We are waiting for the denial letters from [facility] Tracks so we can move forward w/appeal.  This encounter will continue to be updated until final determination.     Specialty Pharmacy Patient Advocate Fax: [phone]

## 2021-04-23 ENCOUNTER — Other Ambulatory Visit (HOSPITAL_COMMUNITY): Payer: Self-pay

## 2021-04-24 ENCOUNTER — Other Ambulatory Visit (HOSPITAL_COMMUNITY): Payer: Self-pay

## 2021-04-24 NOTE — Telephone Encounter (Signed)
Patient Advocate Encounter  Prior Authorization for Evaristo Bury has been approved.    PA# [id number]  Effective dates: 04/23/21 through 10/20/21  Per Test Claim Patients co-pay is $4   Spoke with Pharmacy to Process.  Patient Advocate Fax: [phone]

## 2021-04-24 NOTE — Telephone Encounter (Signed)
Patient Advocate Encounter  Prior Authorization for Jason Stevenson has been approved.    PA# [id number]  Effective dates: 04/23/21 through 10/20/21  Per Test Claim Patients co-pay is $4   Spoke with Pharmacy to Process.  Patient Advocate Fax: [phone]

## 2021-04-24 NOTE — Telephone Encounter (Signed)
Patient Advocate Encounter   Received notification from [facility] Tracks that prior authorization for [employer] Receiver is required by his/her insurance [place] [employer].   PA submitted on 04/24/21  [place] Tracks#: [id number]  Status is pending    [facility] will continue to follow:  Patient Advocate Fax: [phone]

## 2021-04-24 NOTE — Progress Notes (Signed)
Paramedicine Encounter    Patient ID: Jason Stevenson, male    DOB: 01/12/1986, 36 y.o.   MRN: [id number]  Had planned to see Fleming today, however he was not home due to another obligation. We rescheduled for tomorrow.   -Meds picked up at [facility] for [place].    ACTION: Next visit planned for tomorrow

## 2021-04-25 ENCOUNTER — Other Ambulatory Visit (HOSPITAL_COMMUNITY): Payer: Self-pay

## 2021-04-25 NOTE — Progress Notes (Signed)
Paramedicine Encounter    Patient ID: Jason Stevenson, male    DOB: 25-Jan-1986, 36 y.o.   MRN: [id number]  Arrived for home visit for Jason Stevenson who was alert but reporting to not be feeling so good. He reports waking up from a nap shaking and sweating, his wife instantly gave him food and then checked his sugar and it was 87. He reports feeling better after eating. I then checked his sugar and it was 117. He was a&oX4 with no tremors noted on assessment. He reports no chest pain, shortness of breath, dizziness. He does report some hand and feet pain which has become common for him.   He was seen by endo earlier this week and some med adjustments were made. I picked up medications and freestyle today and delivered to him. We reviewed all meds and changes and I filled pill box for one week and labeled new insulins with correct doses for easier understanding. He was grateful.   Lungs clear, no swelling noted. He denied any feelings of fluid retention.   We reviewed appointments and transportation.   We also reviewed diabetic and heart healthy diet management.  Home visit complete and I will see Jason Stevenson in one week.   Refills: NONE  FREESTYLE PLACED ON LEFT ARM AT 1555.   Patient Care Team: Ladell Pier, MD as PCP - General (Internal Medicine) Josue Hector, MD as PCP - Cardiology (Cardiology) Vickie Epley, MD as PCP - Electrophysiology (Cardiology)  Patient Active Problem List   Diagnosis Date Noted   LADA (latent autoimmune diabetes in adults), managed as type 2 ([place]) 03/28/2021   Poorly controlled type 2 diabetes mellitus with circulatory disorder ([place]) 03/19/2021   Ischemic cardiomyopathy    Syncope 12/14/2020   Chest pain 10/30/2020   History of cerebrovascular accident (CVA) with residual deficit 10/27/2020   Protein-calorie malnutrition, severe 09/22/2020   Acute CVA (cerebrovascular accident) ([place]) 09/21/2020   Near syncope 09/21/2020   Encounter for monitoring  Coumadin therapy 09/20/2020   LV (left ventricular) mural thrombus 09/13/2020   Seizure ([place]) 09/13/2020   Neurological deficit present 09/12/2020   History of COVID-19 09/12/2020   Intracerebral hemorrhage 09/12/2020   Acute cerebrovascular accident (CVA) ([place]) 09/12/2020   COVID-19 08/23/2020   Noncompliance with medication treatment due to intermittent use of medication 10/12/2018   Dyslipidemia 03/54/6568   Chronic systolic CHF (congestive heart failure) ([place]) 06/18/2015   Needs flu shot 12/23/2013   Non-ischemic cardiomyopathy ([place]) 11/06/2012   HTN (hypertension) 11/06/2012   HLD (hyperlipidemia) 11/06/2012    Current Outpatient Medications:    Accu-Chek Softclix Lancets lancets, Use to check blood sugar three times daily., Disp: 100 each, Rfl: 3   acetaminophen (TYLENOL) 500 MG tablet, Take 1,000 mg by mouth every 8 (eight) hours as needed for mild pain, fever or headache., Disp: , Rfl:    atorvastatin (LIPITOR) 80 MG tablet, Take 1 tablet (80 mg total) by mouth daily., Disp: 30 tablet, Rfl: 2   Blood Glucose Monitoring Suppl (ACCU-CHEK GUIDE) w/Device KIT, Use to check blood sugar three times daily., Disp: 1 kit, Rfl: 0   Blood Pressure Monitor DEVI, Use as directed to check home blood pressure 2-3 times a week, Disp: 1 Device, Rfl: 0   Continuous Blood Gluc Receiver (FREESTYLE LIBRE 2 READER) DEVI, 1 each by Does not apply route daily., Disp: 1 each, Rfl: 0   Continuous Blood Gluc Sensor (FREESTYLE LIBRE 2 SENSOR) MISC, 1 each by Does not apply route every 14 (  fourteen) days., Disp: 6 each, Rfl: 3   dapagliflozin propanediol (FARXIGA) 5 MG TABS tablet, Take 1 tablet (5 mg total) by mouth daily before breakfast., Disp: 30 tablet, Rfl: 3   digoxin (LANOXIN) 0.125 MG tablet, Take 1 tablet (0.125 mg total) by mouth daily., Disp: 30 tablet, Rfl: 2   DULoxetine (CYMBALTA) 30 MG capsule, Take 1 capsule (30 mg total) by mouth daily., Disp: 30 capsule, Rfl: 2   Evolocumab (REPATHA  SURECLICK) 449 MG/ML SOAJ, Inject 1 pen into the skin every 14 (fourteen) days., Disp: 2 mL, Rfl: 11   ezetimibe (ZETIA) 10 MG tablet, Take 1 tablet (10 mg total) by mouth daily., Disp: 30 tablet, Rfl: 2   furosemide (LASIX) 20 MG tablet, Take 1 tablet (20 mg total) by mouth daily., Disp: 30 tablet, Rfl: 3   glucose blood (ACCU-CHEK GUIDE) test strip, Use to check blood sugar three times daily., Disp: 100 each, Rfl: 2   ibuprofen (ADVIL) 600 MG tablet, Take 600 mg by mouth every 6 (six) hours as needed., Disp: , Rfl:    insulin aspart (FIASP FLEXTOUCH) 100 UNIT/ML FlexTouch Pen, Inject 5-8 Units into the skin 3 (three) times daily before meals., Disp: 30 mL, Rfl: 3   insulin degludec (TRESIBA FLEXTOUCH) 200 UNIT/ML FlexTouch Pen, Inject 20 Units into the skin daily., Disp: 30 mL, Rfl: 3   insulin isophane & regular human KwikPen (NOVOLIN 70/30 KWIKPEN) (70-30) 100 UNIT/ML KwikPen, 22 units SQ Q a.m and 16 units Q p.m, Disp: 15 mL, Rfl: 5   Insulin Pen Needle 32G X 4 MM MISC, Use to inject insulin., Disp: 200 each, Rfl: 2   Insulin Syringe-Needle U-100 (RELION INSULIN SYRINGE) 31G X 15/64" 0.3 ML MISC, Use to inject insulin daily., Disp: 100 each, Rfl: 11   ivabradine (CORLANOR) 7.5 MG TABS tablet, Take 1 tablet (7.5 mg total) by mouth 2 (two) times daily with a meal., Disp: 60 tablet, Rfl:    levETIRAcetam (KEPPRA) 750 MG tablet, Take 2 tablets (1,500 mg total) by mouth 2 (two) times daily., Disp: 120 tablet, Rfl: 3   Menthol, Topical Analgesic, (ICY HOT BACK EX), Apply 1 application topically as needed (neuropathy). Icy hot cream, Disp: , Rfl:    metFORMIN (GLUCOPHAGE) 500 MG tablet, Take 1 tablet (500 mg total) by mouth 2 (two) times daily with a meal., Disp: 60 tablet, Rfl: 2   metoprolol (TOPROL-XL) 200 MG 24 hr tablet, Take 0.5 tablets (100 mg total) by mouth daily. Take with or immediately following a meal., Disp: 30 tablet, Rfl: 2   Oxcarbazepine (TRILEPTAL) 300 MG tablet, Take 2 tablets (600  mg total) by mouth 2 (two) times daily., Disp: 120 tablet, Rfl: 3   phenytoin (DILANTIN) 100 MG ER capsule, Take 1 capsule (100 mg total) by mouth 3 (three) times daily., Disp: 90 capsule, Rfl: 5   pregabalin (LYRICA) 300 MG capsule, Take 1 capsule (300 mg total) by mouth at bedtime., Disp: 30 capsule, Rfl: 5   sacubitril-valsartan (ENTRESTO) 24-26 MG, Take 1 tablet by mouth 2 (two) times daily., Disp: 60 tablet, Rfl: 0   spironolactone (ALDACTONE) 25 MG tablet, Take 1 tablet (25 mg total) by mouth at bedtime., Disp: 90 tablet, Rfl: 3   warfarin (COUMADIN) 5 MG tablet, Take 1-1.5 tablets (5-7.5 mg total) by mouth daily as directed by Coumadin clinic, Disp: 40 tablet, Rfl: 3 No Known Allergies   Social History   Socioeconomic History   Marital status: Married    Spouse name: Not on  file   Number of children: 0   Years of education: Not on file   Highest education level: Not on file  Occupational History   Occupation: unemployed  Tobacco Use   Smoking status: Never   Smokeless tobacco: Never  Vaping Use   Vaping Use: Not on file  Substance and Sexual Activity   Alcohol use: Not Currently    Comment: "occasional" when "hanging out with the wrong people" No recent use.   Drug use: Not Currently    Types: Marijuana    Comment: occasional, last 2013   Sexual activity: Not Currently  Other Topics Concern   Not on file  Social History Narrative   Pt lives at home with his wife    Right handed    Caffeine- hardly any   Social Determinants of Health   Financial Resource Strain: High Risk   Difficulty of Paying Living Expenses: Hard  Food Insecurity: No Food Insecurity   Worried About Charity fundraiser in the Last Year: Never true   Ran Out of Food in the Last Year: Never true  Transportation Needs: Unmet Transportation Needs   Lack of Transportation (Medical): No   Lack of Transportation (Non-Medical): Yes  Physical Activity: Not on file  Stress: Not on file  Social  Connections: Not on file  Intimate Partner Violence: Not on file    Physical Exam Vitals reviewed.  Constitutional:      Appearance: Normal appearance. He is normal weight.  HENT:     Head: Normocephalic.     Nose: Nose normal.     Mouth/Throat:     Mouth: Mucous membranes are moist.     Pharynx: Oropharynx is clear.  Eyes:     Conjunctiva/sclera: Conjunctivae normal.     Pupils: Pupils are equal, round, and reactive to light.  Cardiovascular:     Rate and Rhythm: Normal rate and regular rhythm.     Pulses: Normal pulses.     Heart sounds: Normal heart sounds.  Pulmonary:     Effort: Pulmonary effort is normal.     Breath sounds: Normal breath sounds.  Abdominal:     General: Abdomen is flat.     Palpations: Abdomen is soft.  Musculoskeletal:        General: No swelling. Normal range of motion.     Cervical back: Normal range of motion.     Right lower leg: No edema.     Left lower leg: No edema.  Skin:    General: Skin is warm and dry.     Capillary Refill: Capillary refill takes less than 2 seconds.  Neurological:     General: No focal deficit present.     Mental Status: He is alert. Mental status is at baseline.  Psychiatric:        Mood and Affect: Mood normal.        Future Appointments  Date Time Provider [place]  04/27/2021  8:45 AM [facility]  05/11/2021  4:00 PM Vickie Epley, MD [facility]  05/16/2021  9:30 AM Tresa Endo, RPH-CPP [facility] None  06/19/2021 10:00 AM Kirsteins, Luanna Salk, MD [facility]  06/22/2021 11:20 AM Larey Dresser, MD [facility] None  06/26/2021  9:30 AM Leta Baptist, Earlean Polka, MD [facility] None  07/17/2021  9:40 AM Philemon Kingdom, MD [facility] None     ACTION: Home visit completed

## 2021-04-25 NOTE — Telephone Encounter (Signed)
Patient Advocate Encounter  Prior Authorization for [employer] Reader has been approved.    PA# [id number]  Effective dates: 04/24/21 through 07/23/21  Per Test Claim Patients co-pay is $0   Spoke with Pharmacy to Process.  Patient Advocate Fax: [phone]

## 2021-04-26 ENCOUNTER — Encounter: Payer: Self-pay | Admitting: Internal Medicine

## 2021-04-26 ENCOUNTER — Telehealth: Payer: Self-pay | Admitting: Internal Medicine

## 2021-04-26 NOTE — Telephone Encounter (Signed)
Patient called needing instruction on Freestyle Libre 2. Please call [phone].

## 2021-04-26 NOTE — Telephone Encounter (Signed)
Called and confirmed pt sensor is not working nor has it been set up correctly. He has not been to see diabetic educator. Advised message will be forwarded to educator.

## 2021-04-27 ENCOUNTER — Other Ambulatory Visit: Payer: Self-pay

## 2021-04-27 ENCOUNTER — Ambulatory Visit (INDEPENDENT_AMBULATORY_CARE_PROVIDER_SITE_OTHER): Payer: Medicaid Other

## 2021-04-27 DIAGNOSIS — Z7901 Long term (current) use of anticoagulants: Secondary | ICD-10-CM | POA: Diagnosis not present

## 2021-04-27 DIAGNOSIS — Z5181 Encounter for therapeutic drug level monitoring: Secondary | ICD-10-CM

## 2021-04-27 DIAGNOSIS — I639 Cerebral infarction, unspecified: Secondary | ICD-10-CM

## 2021-04-27 DIAGNOSIS — I513 Intracardiac thrombosis, not elsewhere classified: Secondary | ICD-10-CM

## 2021-04-27 LAB — POCT INR: INR: 2.6 (ref 2.0–3.0)

## 2021-04-27 NOTE — Telephone Encounter (Signed)
Pt contacted by nutritionist Antonieta Iba and documented in separate encounter.

## 2021-04-27 NOTE — Patient Instructions (Signed)
-  continue taking 1 tablet daily except for 1.5 tablets on Sunday, Tuesdays, Thursdays and Saturdays.  - Keep 2 leafy veggies in your diet weekly.  - Recheck in 4 weeks  Coumadin Clinic [phone].

## 2021-04-30 ENCOUNTER — Other Ambulatory Visit: Payer: Self-pay | Admitting: Internal Medicine

## 2021-04-30 DIAGNOSIS — E139 Other specified diabetes mellitus without complications: Secondary | ICD-10-CM

## 2021-05-02 ENCOUNTER — Other Ambulatory Visit (HOSPITAL_COMMUNITY): Payer: Self-pay

## 2021-05-02 ENCOUNTER — Other Ambulatory Visit: Payer: Self-pay

## 2021-05-02 ENCOUNTER — Encounter: Payer: Medicaid Other | Attending: Internal Medicine | Admitting: Nutrition

## 2021-05-02 DIAGNOSIS — E139 Other specified diabetes mellitus without complications: Secondary | ICD-10-CM | POA: Insufficient documentation

## 2021-05-02 NOTE — Progress Notes (Signed)
Paramedicine Encounter    Patient ID: Jason Stevenson, male    DOB: 08-05-1985, 36 y.o.   MRN: [id number]   Arrived for home visit for Jason Stevenson who reports feeling very tired. He went to Endocrinology today to have assistance with Freestyle. Freestyle is now scanning appropriately.   Jason Stevenson and I reviewed medications and filled pill box accordingly.  Vitals and assessment as noted. No swelling, no weight gain, lungs clear.   Jason Stevenson reports he has had some balance issues over the last week feeling "off". He did admit to not eating some meals on some days. Unsure if he had some issues with blood sugar dropping.   He reports taking his Repatha yesterday.   He has [facility] appointments set up for week of 1/30. He is concerned with cost of gas getting there as well as having to stay over night. I will talk with Jason Stevenson our social worker about same.   He has an appointment scheduled with Jason Stevenson at [facility] on 2/1, he will be at [facility]. I will get this rescheduled.   We reviewed medications and filled pill box accordingly.   Appointments reviewed and confirmed.   Home visit complete. I will see him in one week.   Refills: NONE        Patient Care Team: Ladell Pier, MD as PCP - General (Internal Medicine) Josue Hector, MD as PCP - Cardiology (Cardiology) Vickie Epley, MD as PCP - Electrophysiology (Cardiology)  Patient Active Problem List   Diagnosis Date Noted   LADA (latent autoimmune diabetes in adults), managed as type 2 ([place]) 03/28/2021   Poorly controlled type 2 diabetes mellitus with circulatory disorder ([place]) 03/19/2021   Ischemic cardiomyopathy    Syncope 12/14/2020   Chest pain 10/30/2020   History of cerebrovascular accident (CVA) with residual deficit 10/27/2020   Protein-calorie malnutrition, severe 09/22/2020   Acute CVA (cerebrovascular accident) ([place]) 09/21/2020   Near syncope 09/21/2020   Encounter for monitoring Coumadin therapy 09/20/2020   LV (left  ventricular) mural thrombus 09/13/2020   Seizure ([place]) 09/13/2020   Neurological deficit present 09/12/2020   History of COVID-19 09/12/2020   Intracerebral hemorrhage 09/12/2020   Acute cerebrovascular accident (CVA) ([place]) 09/12/2020   COVID-19 08/23/2020   Noncompliance with medication treatment due to intermittent use of medication 10/12/2018   Dyslipidemia 19/62/2297   Chronic systolic CHF (congestive heart failure) ([place]) 06/18/2015   Needs flu shot 12/23/2013   Non-ischemic cardiomyopathy ([place]) 11/06/2012   HTN (hypertension) 11/06/2012   HLD (hyperlipidemia) 11/06/2012    Current Outpatient Medications:    Accu-Chek Softclix Lancets lancets, Use to check blood sugar three times daily., Disp: 100 each, Rfl: 3   acetaminophen (TYLENOL) 500 MG tablet, Take 1,000 mg by mouth every 8 (eight) hours as needed for mild pain, fever or headache., Disp: , Rfl:    atorvastatin (LIPITOR) 80 MG tablet, Take 1 tablet (80 mg total) by mouth daily., Disp: 30 tablet, Rfl: 2   Blood Glucose Monitoring Suppl (ACCU-CHEK GUIDE) w/Device KIT, Use to check blood sugar three times daily., Disp: 1 kit, Rfl: 0   Blood Pressure Monitor DEVI, Use as directed to check home blood pressure 2-3 times a week, Disp: 1 Device, Rfl: 0   Continuous Blood Gluc Receiver (FREESTYLE LIBRE 2 READER) DEVI, 1 each by Does not apply route daily., Disp: 1 each, Rfl: 0   Continuous Blood Gluc Sensor (FREESTYLE LIBRE 2 SENSOR) MISC, 1 each by Does not apply route every 14 (fourteen) days., Disp:  6 each, Rfl: 3   dapagliflozin propanediol (FARXIGA) 5 MG TABS tablet, Take 1 tablet (5 mg total) by mouth daily before breakfast., Disp: 30 tablet, Rfl: 3   digoxin (LANOXIN) 0.125 MG tablet, Take 1 tablet (0.125 mg total) by mouth daily., Disp: 30 tablet, Rfl: 2   DULoxetine (CYMBALTA) 30 MG capsule, Take 1 capsule (30 mg total) by mouth daily., Disp: 30 capsule, Rfl: 2   Evolocumab (REPATHA SURECLICK) 782 MG/ML SOAJ, Inject 1 pen into  the skin every 14 (fourteen) days., Disp: 2 mL, Rfl: 11   ezetimibe (ZETIA) 10 MG tablet, Take 1 tablet (10 mg total) by mouth daily., Disp: 30 tablet, Rfl: 2   furosemide (LASIX) 20 MG tablet, Take 1 tablet (20 mg total) by mouth daily., Disp: 30 tablet, Rfl: 3   glucose blood (ACCU-CHEK GUIDE) test strip, Use to check blood sugar three times daily., Disp: 100 each, Rfl: 2   ibuprofen (ADVIL) 600 MG tablet, Take 600 mg by mouth every 6 (six) hours as needed., Disp: , Rfl:    insulin aspart (FIASP FLEXTOUCH) 100 UNIT/ML FlexTouch Pen, Inject 5-8 Units into the skin 3 (three) times daily before meals., Disp: 30 mL, Rfl: 3   insulin degludec (TRESIBA FLEXTOUCH) 200 UNIT/ML FlexTouch Pen, Inject 20 Units into the skin daily., Disp: 30 mL, Rfl: 3   insulin isophane & regular human KwikPen (NOVOLIN 70/30 KWIKPEN) (70-30) 100 UNIT/ML KwikPen, 22 units SQ Q a.m and 16 units Q p.m (Patient not taking: Reported on 04/25/2021), Disp: 15 mL, Rfl: 5   Insulin Pen Needle 32G X 4 MM MISC, Use to inject insulin., Disp: 200 each, Rfl: 2   Insulin Syringe-Needle U-100 (RELION INSULIN SYRINGE) 31G X 15/64" 0.3 ML MISC, Use to inject insulin daily., Disp: 100 each, Rfl: 11   ivabradine (CORLANOR) 7.5 MG TABS tablet, Take 1 tablet (7.5 mg total) by mouth 2 (two) times daily with a meal., Disp: 60 tablet, Rfl:    levETIRAcetam (KEPPRA) 750 MG tablet, Take 2 tablets (1,500 mg total) by mouth 2 (two) times daily., Disp: 120 tablet, Rfl: 3   Menthol, Topical Analgesic, (ICY HOT BACK EX), Apply 1 application topically as needed (neuropathy). Icy hot cream, Disp: , Rfl:    metFORMIN (GLUCOPHAGE) 500 MG tablet, Take 1 tablet (500 mg total) by mouth 2 (two) times daily with a meal., Disp: 60 tablet, Rfl: 2   metoprolol (TOPROL-XL) 200 MG 24 hr tablet, Take 0.5 tablets (100 mg total) by mouth daily. Take with or immediately following a meal., Disp: 30 tablet, Rfl: 2   Oxcarbazepine (TRILEPTAL) 300 MG tablet, Take 2 tablets (600 mg  total) by mouth 2 (two) times daily., Disp: 120 tablet, Rfl: 3   phenytoin (DILANTIN) 100 MG ER capsule, Take 1 capsule (100 mg total) by mouth 3 (three) times daily., Disp: 90 capsule, Rfl: 5   pregabalin (LYRICA) 300 MG capsule, Take 1 capsule (300 mg total) by mouth at bedtime., Disp: 30 capsule, Rfl: 5   sacubitril-valsartan (ENTRESTO) 24-26 MG, Take 1 tablet by mouth 2 (two) times daily., Disp: 60 tablet, Rfl: 0   spironolactone (ALDACTONE) 25 MG tablet, Take 1 tablet (25 mg total) by mouth at bedtime., Disp: 90 tablet, Rfl: 3   warfarin (COUMADIN) 5 MG tablet, Take 1-1.5 tablets (5-7.5 mg total) by mouth daily as directed by Coumadin clinic, Disp: 40 tablet, Rfl: 3 No Known Allergies   Social History   Socioeconomic History   Marital status: Married    Spouse  name: Not on file   Number of children: 0   Years of education: Not on file   Highest education level: Not on file  Occupational History   Occupation: unemployed  Tobacco Use   Smoking status: Never   Smokeless tobacco: Never  Vaping Use   Vaping Use: Not on file  Substance and Sexual Activity   Alcohol use: Not Currently    Comment: "occasional" when "hanging out with the wrong people" No recent use.   Drug use: Not Currently    Types: Marijuana    Comment: occasional, last 2013   Sexual activity: Not Currently  Other Topics Concern   Not on file  Social History Narrative   Pt lives at home with his wife    Right handed    Caffeine- hardly any   Social Determinants of Health   Financial Resource Strain: High Risk   Difficulty of Paying Living Expenses: Hard  Food Insecurity: No Food Insecurity   Worried About Charity fundraiser in the Last Year: Never true   Ran Out of Food in the Last Year: Never true  Transportation Needs: Unmet Transportation Needs   Lack of Transportation (Medical): No   Lack of Transportation (Non-Medical): Yes  Physical Activity: Not on file  Stress: Not on file  Social  Connections: Not on file  Intimate Partner Violence: Not on file    Physical Exam Vitals reviewed.  Constitutional:      Appearance: Normal appearance. He is normal weight.  HENT:     Head: Normocephalic.     Nose: Nose normal.     Mouth/Throat:     Mouth: Mucous membranes are moist.     Pharynx: Oropharynx is clear.  Eyes:     Conjunctiva/sclera: Conjunctivae normal.     Pupils: Pupils are equal, round, and reactive to light.  Cardiovascular:     Rate and Rhythm: Normal rate and regular rhythm.     Pulses: Normal pulses.     Heart sounds: Normal heart sounds.  Pulmonary:     Effort: Pulmonary effort is normal.     Breath sounds: Normal breath sounds.  Abdominal:     General: Abdomen is flat.     Palpations: Abdomen is soft.  Musculoskeletal:        General: No swelling. Normal range of motion.     Cervical back: Normal range of motion.     Right lower leg: No edema.     Left lower leg: No edema.  Skin:    General: Skin is warm and dry.     Capillary Refill: Capillary refill takes less than 2 seconds.  Neurological:     General: No focal deficit present.     Mental Status: He is alert. Mental status is at baseline.  Psychiatric:        Mood and Affect: Mood normal.        Future Appointments  Date Time Provider [place]  05/11/2021  4:00 PM Vickie Epley, MD [facility]  05/16/2021  9:30 AM Tresa Endo, RPH-CPP [facility] None  05/25/2021  8:30 AM [facility]  06/19/2021 10:00 AM Kirsteins, Luanna Salk, MD [facility] CPR  06/22/2021 11:20 AM Larey Dresser, MD [facility] None  06/26/2021  9:30 AM Leta Baptist, Earlean Polka, MD [facility] None  07/17/2021  9:40 AM Philemon Kingdom, MD [facility] None     ACTION: Home visit completed

## 2021-05-08 ENCOUNTER — Telehealth (HOSPITAL_COMMUNITY): Payer: Self-pay

## 2021-05-08 NOTE — Progress Notes (Signed)
Patient was trained on the use of the Libre 2 CGM.  We discussed the difference between sensor readings and blood sugar readings, and when he will need to do finger sticks.  He reports good understanding of this. He downloaded the app to his phone, and this was linked to [place].  He was shown how to scan and encouraged to read the [facility] for this.  He agreed to do this and had no final questions.

## 2021-05-08 NOTE — Patient Instructions (Signed)
Read manual Call help line if questions or problems.

## 2021-05-08 NOTE — Telephone Encounter (Signed)
Spoke to Jason Stevenson to reschedule our appointment for tomorrow. He was agreeable with this and his wife will fill his pill box. He knows to reach out if he has any further questions.      He did state he is calling [facility] tomorrow because he lacks transportation to his appointments next week because their car that was once drivable is not. I will forward this to [place] as well as we were inquiring about possibly assisting with gas. Jason Stevenson was provided the transplant clinic patient assistance number and plans on calling them tomorrow on 1/25.   I will follow up next week. Call complete.

## 2021-05-09 ENCOUNTER — Telehealth (HOSPITAL_COMMUNITY): Payer: Self-pay | Admitting: Licensed Clinical Social Worker

## 2021-05-09 NOTE — Telephone Encounter (Signed)
CSW consulted by paramedic to help pt figure out transportation to Transplant eval next week.  Pt reports his car has just broken down (radiator broken) and that it would cost about $550-650 to repair but he has to get to [facility] for a transplant eval next week.  Pt has Medicaid so should be eligible for Medicaid transport- provided pt with the number- he is calling [employer] to complete rider assessment and request help with transportation to those appts.  Pt to call CSW back after he works on this to see if this resolves the issue.  Will continue to follow and assist as needed  Jorge Ny, [facility] Desk#: [phone] Cell#: [phone]

## 2021-05-11 ENCOUNTER — Telehealth (HOSPITAL_COMMUNITY): Payer: Self-pay | Admitting: Licensed Clinical Social Worker

## 2021-05-11 ENCOUNTER — Ambulatory Visit: Payer: Medicaid Other | Admitting: Cardiology

## 2021-05-11 NOTE — Telephone Encounter (Signed)
CSW called pt to check in regarding transportation for appts at [facility] next week.  They were unable to find transportation to get to evaluation so pt called [facility] and rescheduled- will be week of Feb. 13th per patient.  He will plan to call Medicaid transport to set up rides in case his car is not repaired by that time.  Will continue to follow and assist as needed  Burna Sis, LCSW Clinical Social Worker [facility] Desk#: [phone] Cell#: [phone]

## 2021-05-11 NOTE — Telephone Encounter (Signed)
Rider intake with [employer] completed but they require 5 business day notice for rides so would be unable to schedule rides for next week using their service.  CSW encouraged them to inquire with friends or family regarding borrowing a car or getting a ride from someone and we could assist with some gas money expenses- they will look into this option and get back with CSW  Burna Sis, LCSW Clinical Social Worker [facility] Desk#: [phone] Cell#: [phone]

## 2021-05-16 ENCOUNTER — Ambulatory Visit: Payer: Medicaid Other | Admitting: Pharmacist

## 2021-05-21 ENCOUNTER — Encounter (HOSPITAL_COMMUNITY): Payer: Self-pay | Admitting: Cardiology

## 2021-05-22 ENCOUNTER — Other Ambulatory Visit (HOSPITAL_COMMUNITY): Payer: Self-pay

## 2021-05-23 ENCOUNTER — Other Ambulatory Visit (HOSPITAL_COMMUNITY): Payer: Self-pay

## 2021-05-23 NOTE — Progress Notes (Signed)
Paramedicine Encounter    Patient ID: Jason Stevenson, male    DOB: 1985-12-20, 36 y.o.   MRN: [id number]   Arrived for home visit for Jason Stevenson who reports feeling good with no complaints today. He stated he has been doing well with no dizziness, chest pain or shortness of breath. No swelling or edema and denied any hypoglycemic episodes.   He is using [employer] and it is working well.   He went to dentist earlier this week and got his teeth cleaned. He is needing an extraction of one tooth. Dr. Aundra Dubin signed form and they faxed to dental office today.   Assessment and vitals obtained.  [facility] [place] reports his appointment with [facility] is being moved possibly to march 6-10. He reports he will update me once he knows.   He inquired about status of disability, I will see if Eliezer Lofts can assist. He is also wondering if he is section 8 eligible. I will inquire.   We confirmed appointments.   I reviewed meds and filled one pill box for one week.   No refills needed at this time.   Home visit complete. I will see [place] in one week.    Patient Care Team: Ladell Pier, MD as PCP - General (Internal Medicine) Josue Hector, MD as PCP - Cardiology (Cardiology) Vickie Epley, MD as PCP - Electrophysiology (Cardiology)  Patient Active Problem List   Diagnosis Date Noted   LADA (latent autoimmune diabetes in adults), managed as type 2 ([place]) 03/28/2021   Poorly controlled type 2 diabetes mellitus with circulatory disorder ([place]) 03/19/2021   Ischemic cardiomyopathy    Syncope 12/14/2020   Chest pain 10/30/2020   History of cerebrovascular accident (CVA) with residual deficit 10/27/2020   Protein-calorie malnutrition, severe 09/22/2020   Acute CVA (cerebrovascular accident) ([place]) 09/21/2020   Near syncope 09/21/2020   Encounter for monitoring Coumadin therapy 09/20/2020   LV (left ventricular) mural thrombus 09/13/2020   Seizure ([place]) 09/13/2020   Neurological deficit  present 09/12/2020   History of COVID-19 09/12/2020   Intracerebral hemorrhage 09/12/2020   Acute cerebrovascular accident (CVA) ([place]) 09/12/2020   COVID-19 08/23/2020   Noncompliance with medication treatment due to intermittent use of medication 10/12/2018   Dyslipidemia 78/29/5621   Chronic systolic CHF (congestive heart failure) ([place]) 06/18/2015   Needs flu shot 12/23/2013   Non-ischemic cardiomyopathy ([place]) 11/06/2012   HTN (hypertension) 11/06/2012   HLD (hyperlipidemia) 11/06/2012    Current Outpatient Medications:    Accu-Chek Softclix Lancets lancets, Use to check blood sugar three times daily., Disp: 100 each, Rfl: 3   acetaminophen (TYLENOL) 500 MG tablet, Take 1,000 mg by mouth every 8 (eight) hours as needed for mild pain, fever or headache., Disp: , Rfl:    atorvastatin (LIPITOR) 80 MG tablet, Take 1 tablet (80 mg total) by mouth daily., Disp: 30 tablet, Rfl: 2   Blood Glucose Monitoring Suppl (ACCU-CHEK GUIDE) w/Device KIT, Use to check blood sugar three times daily., Disp: 1 kit, Rfl: 0   Blood Pressure Monitor DEVI, Use as directed to check home blood pressure 2-3 times a week, Disp: 1 Device, Rfl: 0   Continuous Blood Gluc Receiver (FREESTYLE LIBRE 2 READER) DEVI, 1 each by Does not apply route daily., Disp: 1 each, Rfl: 0   Continuous Blood Gluc Sensor (FREESTYLE LIBRE 2 SENSOR) MISC, 1 each by Does not apply route every 14 (fourteen) days., Disp: 6 each, Rfl: 3   dapagliflozin propanediol (FARXIGA) 5 MG TABS  tablet, Take 1 tablet (5 mg total) by mouth daily before breakfast., Disp: 30 tablet, Rfl: 3   digoxin (LANOXIN) 0.125 MG tablet, Take 1 tablet (0.125 mg total) by mouth daily., Disp: 30 tablet, Rfl: 2   DULoxetine (CYMBALTA) 30 MG capsule, Take 1 capsule (30 mg total) by mouth daily., Disp: 30 capsule, Rfl: 2   Evolocumab (REPATHA SURECLICK) 979 MG/ML SOAJ, Inject 1 pen into the skin every 14 (fourteen) days., Disp: 2 mL, Rfl: 11   ezetimibe (ZETIA) 10 MG tablet,  Take 1 tablet (10 mg total) by mouth daily., Disp: 30 tablet, Rfl: 2   furosemide (LASIX) 20 MG tablet, Take 1 tablet (20 mg total) by mouth daily., Disp: 30 tablet, Rfl: 3   glucose blood (ACCU-CHEK GUIDE) test strip, Use to check blood sugar three times daily., Disp: 100 each, Rfl: 2   ibuprofen (ADVIL) 600 MG tablet, Take 600 mg by mouth every 6 (six) hours as needed., Disp: , Rfl:    insulin aspart (FIASP FLEXTOUCH) 100 UNIT/ML FlexTouch Pen, Inject 5-8 Units into the skin 3 (three) times daily before meals., Disp: 30 mL, Rfl: 3   insulin degludec (TRESIBA FLEXTOUCH) 200 UNIT/ML FlexTouch Pen, Inject 20 Units into the skin daily., Disp: 30 mL, Rfl: 3   insulin isophane & regular human KwikPen (NOVOLIN 70/30 KWIKPEN) (70-30) 100 UNIT/ML KwikPen, 22 units SQ Q a.m and 16 units Q p.m (Patient not taking: Reported on 04/25/2021), Disp: 15 mL, Rfl: 5   Insulin Pen Needle 32G X 4 MM MISC, Use to inject insulin., Disp: 200 each, Rfl: 2   Insulin Syringe-Needle U-100 (RELION INSULIN SYRINGE) 31G X 15/64" 0.3 ML MISC, Use to inject insulin daily., Disp: 100 each, Rfl: 11   ivabradine (CORLANOR) 7.5 MG TABS tablet, Take 1 tablet (7.5 mg total) by mouth 2 (two) times daily with a meal., Disp: 60 tablet, Rfl:    levETIRAcetam (KEPPRA) 750 MG tablet, Take 2 tablets (1,500 mg total) by mouth 2 (two) times daily., Disp: 120 tablet, Rfl: 3   Menthol, Topical Analgesic, (ICY HOT BACK EX), Apply 1 application topically as needed (neuropathy). Icy hot cream, Disp: , Rfl:    metFORMIN (GLUCOPHAGE) 500 MG tablet, Take 1 tablet (500 mg total) by mouth 2 (two) times daily with a meal., Disp: 60 tablet, Rfl: 2   metoprolol (TOPROL-XL) 200 MG 24 hr tablet, Take 0.5 tablets (100 mg total) by mouth daily. Take with or immediately following a meal., Disp: 30 tablet, Rfl: 2   Oxcarbazepine (TRILEPTAL) 300 MG tablet, Take 2 tablets (600 mg total) by mouth 2 (two) times daily., Disp: 120 tablet, Rfl: 3   phenytoin (DILANTIN) 100  MG ER capsule, Take 1 capsule (100 mg total) by mouth 3 (three) times daily., Disp: 90 capsule, Rfl: 5   pregabalin (LYRICA) 300 MG capsule, Take 1 capsule (300 mg total) by mouth at bedtime., Disp: 30 capsule, Rfl: 5   sacubitril-valsartan (ENTRESTO) 24-26 MG, Take 1 tablet by mouth 2 (two) times daily., Disp: 60 tablet, Rfl: 0   spironolactone (ALDACTONE) 25 MG tablet, Take 1 tablet (25 mg total) by mouth at bedtime., Disp: 90 tablet, Rfl: 3   warfarin (COUMADIN) 5 MG tablet, Take 1-1.5 tablets (5-7.5 mg total) by mouth daily as directed by Coumadin clinic, Disp: 40 tablet, Rfl: 3 No Known Allergies   Social History   Socioeconomic History   Marital status: Married    Spouse name: Not on file   Number of children: 0  Years of education: Not on file   Highest education level: Not on file  Occupational History   Occupation: unemployed  Tobacco Use   Smoking status: Never   Smokeless tobacco: Never  Vaping Use   Vaping Use: Not on file  Substance and Sexual Activity   Alcohol use: Not Currently    Comment: "occasional" when "hanging out with the wrong people" No recent use.   Drug use: Not Currently    Types: Marijuana    Comment: occasional, last 2013   Sexual activity: Not Currently  Other Topics Concern   Not on file  Social History Narrative   Pt lives at home with his wife    Right handed    Caffeine- hardly any   Social Determinants of Health   Financial Resource Strain: High Risk   Difficulty of Paying Living Expenses: Hard  Food Insecurity: No Food Insecurity   Worried About Charity fundraiser in the Last Year: Never true   Ran Out of Food in the Last Year: Never true  Transportation Needs: Unmet Transportation Needs   Lack of Transportation (Medical): No   Lack of Transportation (Non-Medical): Yes  Physical Activity: Not on file  Stress: Not on file  Social Connections: Not on file  Intimate Partner Violence: Not on file    Physical Exam Vitals  reviewed.  Constitutional:      Appearance: Normal appearance. He is normal weight.  HENT:     Head: Normocephalic.     Nose: Nose normal.     Mouth/Throat:     Mouth: Mucous membranes are moist.     Pharynx: Oropharynx is clear.  Eyes:     Conjunctiva/sclera: Conjunctivae normal.     Pupils: Pupils are equal, round, and reactive to light.  Cardiovascular:     Rate and Rhythm: Normal rate and regular rhythm.     Pulses: Normal pulses.     Heart sounds: Normal heart sounds.  Pulmonary:     Effort: Pulmonary effort is normal.     Breath sounds: Normal breath sounds.  Abdominal:     General: Abdomen is flat.     Palpations: Abdomen is soft.  Musculoskeletal:        General: No swelling.     Cervical back: Normal range of motion.     Right lower leg: No edema.     Left lower leg: No edema.  Skin:    General: Skin is warm and dry.     Capillary Refill: Capillary refill takes less than 2 seconds.  Neurological:     General: No focal deficit present.     Mental Status: He is alert. Mental status is at baseline.  Psychiatric:        Mood and Affect: Mood normal.        Future Appointments  Date Time Provider [place]  05/25/2021  8:30 AM [facility]  06/19/2021 10:00 AM Kirsteins, Luanna Salk, MD [facility] CPR  06/22/2021 11:20 AM Larey Dresser, MD [facility] None  06/26/2021  9:30 AM Leta Baptist, Earlean Polka, MD [facility] None  07/17/2021  9:40 AM Philemon Kingdom, MD [facility] None  08/03/2021 11:45 AM Vickie Epley, MD [facility]     ACTION: Home visit completed

## 2021-05-24 ENCOUNTER — Telehealth (HOSPITAL_COMMUNITY): Payer: Self-pay | Admitting: Licensed Clinical Social Worker

## 2021-05-24 NOTE — Telephone Encounter (Signed)
Community paramedic informed CSW that pt inquiring about disability status and section 8 housing.  CSW informed pt that disability case is going through [facility] so he needed to call them to inquire about status.  CSW then informed pt that section 8 housing list is closed and has been closed for years so unsure when this will be an option.  Discussed income based housing options and mailed pt a list.  Will continue to follow and assist as needed  Burna Sis, LCSW Clinical Social Worker [facility] Desk#: [phone] Cell#: [phone]

## 2021-05-25 ENCOUNTER — Ambulatory Visit: Payer: Medicaid Other | Admitting: *Deleted

## 2021-05-25 ENCOUNTER — Other Ambulatory Visit: Payer: Self-pay

## 2021-05-25 DIAGNOSIS — I639 Cerebral infarction, unspecified: Secondary | ICD-10-CM | POA: Diagnosis not present

## 2021-05-25 DIAGNOSIS — Z7901 Long term (current) use of anticoagulants: Secondary | ICD-10-CM

## 2021-05-25 DIAGNOSIS — I513 Intracardiac thrombosis, not elsewhere classified: Secondary | ICD-10-CM

## 2021-05-25 DIAGNOSIS — Z5181 Encounter for therapeutic drug level monitoring: Secondary | ICD-10-CM

## 2021-05-25 LAB — POCT INR: INR: 1.4 — AB (ref 2.0–3.0)

## 2021-05-25 NOTE — Patient Instructions (Signed)
Description   Take 1.5 tablets of warfarin today Take 2 tablets of warfarin tomorrow Then continue to take warfarin 1.5 tablets daily except for 1 tablet on Mondays, Wednesday and Fridays.  Recheck INR in 2 weeks.  Coumadin Clinic [phone]

## 2021-05-30 ENCOUNTER — Telehealth: Payer: Self-pay

## 2021-05-30 ENCOUNTER — Telehealth (HOSPITAL_COMMUNITY): Payer: Self-pay | Admitting: Licensed Clinical Social Worker

## 2021-05-30 DIAGNOSIS — E785 Hyperlipidemia, unspecified: Secondary | ICD-10-CM

## 2021-05-30 NOTE — Addendum Note (Signed)
Addended by: Malena Peer D on: 05/30/2021 11:46 AM   Modules accepted: Orders

## 2021-05-30 NOTE — Telephone Encounter (Signed)
HF Paramedicine Team Based Care Meeting  HF MD- NA  HF NP - Amy Clegg NP-C   [facility] HF Paramedicine  Katie Vicente Males  [facility] admit within the last 30 days for heart failure? no  Medications concerns? Able to manage medications somewhat- considering changing to pillpacks  Transportation issues ? Car broken down- has Medicaid transport now  Eligible for discharge? Awaiting medication stabilization  Burna Sis, LCSW Clinical Social Worker [facility] Desk#: [phone] Cell#: [phone]

## 2021-05-30 NOTE — Telephone Encounter (Signed)
-----   Message from Olene Floss, RPH-CPP sent at 05/30/2021 11:05 AM EST ----- Please see if we can coordinate his coumadin clinic apt and lipid labs. Make sure he is taking his Repatha. thanks ----- Message ----- From: Olene Floss, RPH-CPP Sent: 05/29/2021  12:00 AM EST To: Olene Floss, RPH-CPP  Needs lipids- should have resumed repatha ----- Message ----- From: Olene Floss, RPH-CPP Sent: 03/19/2021  12:00 AM EST To: Olene Floss, RPH-CPP  Ask him to get lipid panel before coumadin clinic apt tomorrow ----- Message ----- From: Olene Floss, RPH-CPP Sent: 03/06/2021  12:00 AM EST To: Olene Floss, RPH-CPP  Did he get updated lipid yet?

## 2021-05-30 NOTE — Telephone Encounter (Signed)
Called and lmom the pt that we need to schedule lipid fasting labs to be the same day as coumadin check.

## 2021-06-06 ENCOUNTER — Telehealth (HOSPITAL_COMMUNITY): Payer: Self-pay

## 2021-06-06 ENCOUNTER — Other Ambulatory Visit (HOSPITAL_COMMUNITY): Payer: Self-pay

## 2021-06-06 NOTE — Telephone Encounter (Signed)
Jason Stevenson is seeking clearance for getting some teeth extracted. I will forward to Dr. Shirlee Latch to indicate if he is cleared for this.

## 2021-06-06 NOTE — Progress Notes (Signed)
Paramedicine Encounter    Patient ID: Ghazi Rumpf, male    DOB: 1985/10/04, 36 y.o.   MRN: [id number]   Arrived for home visit for Paulo who reports feeling good, with no complaints today. He denied chest pain, shortness of breath, dizziness or trouble taking medications.   We reviewed medications and pill box was filled accordingly. No refills needed at this time.   Fabrice will be going to [facility] next week for transplant evaluation.   He also in inquiring the clearance for getting his tooth pulled. I will refer this to HF clinic.    CBG- 109  Freestyle Libre doing well for [employer].   Appointments reviewed and confirmed.   Home visit complete. I will see Antjuan in two weeks, him and his wife will be filling his own pill box next week due to him being at [facility] for transplant eval next week.    Patient Care Team: Ladell Pier, MD as PCP - General (Internal Medicine) Josue Hector, MD as PCP - Cardiology (Cardiology) Vickie Epley, MD as PCP - Electrophysiology (Cardiology)  Patient Active Problem List   Diagnosis Date Noted   LADA (latent autoimmune diabetes in adults), managed as type 2 ([place]) 03/28/2021   Poorly controlled type 2 diabetes mellitus with circulatory disorder ([place]) 03/19/2021   Ischemic cardiomyopathy    Syncope 12/14/2020   Chest pain 10/30/2020   History of cerebrovascular accident (CVA) with residual deficit 10/27/2020   Protein-calorie malnutrition, severe 09/22/2020   Acute CVA (cerebrovascular accident) ([place]) 09/21/2020   Near syncope 09/21/2020   Encounter for monitoring Coumadin therapy 09/20/2020   LV (left ventricular) mural thrombus 09/13/2020   Seizure ([place]) 09/13/2020   Neurological deficit present 09/12/2020   History of COVID-19 09/12/2020   Intracerebral hemorrhage 09/12/2020   Acute cerebrovascular accident (CVA) ([place]) 09/12/2020   COVID-19 08/23/2020   Noncompliance with medication treatment due to intermittent use of  medication 10/12/2018   Dyslipidemia 02/13/5944   Chronic systolic CHF (congestive heart failure) ([place]) 06/18/2015   Needs flu shot 12/23/2013   Non-ischemic cardiomyopathy ([place]) 11/06/2012   HTN (hypertension) 11/06/2012   HLD (hyperlipidemia) 11/06/2012    Current Outpatient Medications:    Accu-Chek Softclix Lancets lancets, Use to check blood sugar three times daily., Disp: 100 each, Rfl: 3   acetaminophen (TYLENOL) 500 MG tablet, Take 1,000 mg by mouth every 8 (eight) hours as needed for mild pain, fever or headache., Disp: , Rfl:    atorvastatin (LIPITOR) 80 MG tablet, Take 1 tablet (80 mg total) by mouth daily., Disp: 30 tablet, Rfl: 2   Blood Glucose Monitoring Suppl (ACCU-CHEK GUIDE) w/Device KIT, Use to check blood sugar three times daily., Disp: 1 kit, Rfl: 0   Blood Pressure Monitor DEVI, Use as directed to check home blood pressure 2-3 times a week, Disp: 1 Device, Rfl: 0   Continuous Blood Gluc Receiver (FREESTYLE LIBRE 2 READER) DEVI, 1 each by Does not apply route daily., Disp: 1 each, Rfl: 0   Continuous Blood Gluc Sensor (FREESTYLE LIBRE 2 SENSOR) MISC, 1 each by Does not apply route every 14 (fourteen) days., Disp: 6 each, Rfl: 3   dapagliflozin propanediol (FARXIGA) 5 MG TABS tablet, Take 1 tablet (5 mg total) by mouth daily before breakfast., Disp: 30 tablet, Rfl: 3   digoxin (LANOXIN) 0.125 MG tablet, Take 1 tablet (0.125 mg total) by mouth daily., Disp: 30 tablet, Rfl: 2   DULoxetine (CYMBALTA) 30 MG capsule, Take 1 capsule (30 mg total) by  mouth daily., Disp: 30 capsule, Rfl: 2 °  Evolocumab (REPATHA SURECLICK) 140 MG/ML SOAJ, Inject 1 pen into the skin every 14 (fourteen) days., Disp: 2 mL, Rfl: 11 °  ezetimibe (ZETIA) 10 MG tablet, Take 1 tablet (10 mg total) by mouth daily., Disp: 30 tablet, Rfl: 2 °  furosemide (LASIX) 20 MG tablet, Take 1 tablet (20 mg total) by mouth daily., Disp: 30 tablet, Rfl: 3 °  glucose blood (ACCU-CHEK GUIDE) test strip, Use to check blood  sugar three times daily., Disp: 100 each, Rfl: 2 °  ibuprofen (ADVIL) 600 MG tablet, Take 600 mg by mouth every 6 (six) hours as needed., Disp: , Rfl:  °  insulin aspart (FIASP FLEXTOUCH) 100 UNIT/ML FlexTouch Pen, Inject 5-8 Units into the skin 3 (three) times daily before meals., Disp: 30 mL, Rfl: 3 °  insulin degludec (TRESIBA FLEXTOUCH) 200 UNIT/ML FlexTouch Pen, Inject 20 Units into the skin daily., Disp: 30 mL, Rfl: 3 °  insulin isophane & regular human KwikPen (NOVOLIN 70/30 KWIKPEN) (70-30) 100 UNIT/ML KwikPen, 22 units SQ Q a.m and 16 units Q p.m (Patient not taking: Reported on 04/25/2021), Disp: 15 mL, Rfl: 5 °  Insulin Pen Needle 32G X 4 MM MISC, Use to inject insulin., Disp: 200 each, Rfl: 2 °  Insulin Syringe-Needle U-100 (RELION INSULIN SYRINGE) 31G X 15/64" 0.3 ML MISC, Use to inject insulin daily., Disp: 100 each, Rfl: 11 °  ivabradine (CORLANOR) 7.5 MG TABS tablet, Take 1 tablet (7.5 mg total) by mouth 2 (two) times daily with a meal., Disp: 60 tablet, Rfl:  °  levETIRAcetam (KEPPRA) 750 MG tablet, Take 2 tablets (1,500 mg total) by mouth 2 (two) times daily., Disp: 120 tablet, Rfl: 3 °  Menthol, Topical Analgesic, (ICY HOT BACK EX), Apply 1 application topically as needed (neuropathy). Icy hot cream, Disp: , Rfl:  °  metFORMIN (GLUCOPHAGE) 500 MG tablet, Take 1 tablet (500 mg total) by mouth 2 (two) times daily with a meal., Disp: 60 tablet, Rfl: 2 °  metoprolol (TOPROL-XL) 200 MG 24 hr tablet, Take 0.5 tablets (100 mg total) by mouth daily. Take with or immediately following a meal., Disp: 30 tablet, Rfl: 2 °  Oxcarbazepine (TRILEPTAL) 300 MG tablet, Take 2 tablets (600 mg total) by mouth 2 (two) times daily., Disp: 120 tablet, Rfl: 3 °  phenytoin (DILANTIN) 100 MG ER capsule, Take 1 capsule (100 mg total) by mouth 3 (three) times daily., Disp: 90 capsule, Rfl: 5 °  pregabalin (LYRICA) 300 MG capsule, Take 1 capsule (300 mg total) by mouth at bedtime., Disp: 30 capsule, Rfl: 5 °   sacubitril-valsartan (ENTRESTO) 24-26 MG, Take 1 tablet by mouth 2 (two) times daily., Disp: 60 tablet, Rfl: 0 °  spironolactone (ALDACTONE) 25 MG tablet, Take 1 tablet (25 mg total) by mouth at bedtime., Disp: 90 tablet, Rfl: 3 °  warfarin (COUMADIN) 5 MG tablet, Take 1-1.5 tablets (5-7.5 mg total) by mouth daily as directed by Coumadin clinic, Disp: 40 tablet, Rfl: 3 °No Known Allergies ° ° °Social History  ° °Socioeconomic History  ° Marital status: Married  °  Spouse name: Not on file  ° Number of children: 0  ° Years of education: Not on file  ° Highest education level: Not on file  °Occupational History  ° Occupation: unemployed  °Tobacco Use  ° Smoking status: Never  ° Smokeless tobacco: Never  °Vaping Use  ° Vaping Use: Not on file  °Substance and Sexual Activity  ° Alcohol   use: Not Currently  °  Comment: "occasional" when "hanging out with the wrong people" No recent use.  ° Drug use: Not Currently  °  Types: Marijuana  °  Comment: occasional, last 2013  ° Sexual activity: Not Currently  °Other Topics Concern  ° Not on file  °Social History Narrative  ° Pt lives at home with his wife   ° Right handed   ° Caffeine- hardly any  ° °Social Determinants of Health  ° °Financial Resource Strain: High Risk  ° Difficulty of Paying Living Expenses: Hard  °Food Insecurity: No Food Insecurity  ° Worried About Running Out of Food in the Last Year: Never true  ° Ran Out of Food in the Last Year: Never true  °Transportation Needs: Unmet Transportation Needs  ° Lack of Transportation (Medical): No  ° Lack of Transportation (Non-Medical): Yes  °Physical Activity: Not on file  °Stress: Not on file  °Social Connections: Not on file  °Intimate Partner Violence: Not on file  ° ° °Physical Exam °Vitals reviewed.  °Constitutional:   °   Appearance: Normal appearance. He is normal weight.  °HENT:  °   Head: Normocephalic.  °   Nose: Nose normal.  °   Mouth/Throat:  °   Mouth: Mucous membranes are moist.  °   Pharynx: Oropharynx is  clear.  °Eyes:  °   Conjunctiva/sclera: Conjunctivae normal.  °   Pupils: Pupils are equal, round, and reactive to light.  °Cardiovascular:  °   Rate and Rhythm: Normal rate and regular rhythm.  °   Pulses: Normal pulses.  °   Heart sounds: Normal heart sounds.  °Pulmonary:  °   Effort: Pulmonary effort is normal.  °   Breath sounds: Normal breath sounds.  °Abdominal:  °   General: Abdomen is flat.  °   Palpations: Abdomen is soft.  °Musculoskeletal:     °   General: No swelling. Normal range of motion.  °   Cervical back: Normal range of motion.  °   Right lower leg: No edema.  °   Left lower leg: No edema.  °Skin: °   General: Skin is warm and dry.  °   Capillary Refill: Capillary refill takes less than 2 seconds.  °Neurological:  °   General: No focal deficit present.  °   Mental Status: He is alert. Mental status is at baseline.  °Psychiatric:     °   Mood and Affect: Mood normal.  ° ° ° ° ° ° °Future Appointments  °Date Time Provider Department Center  °06/08/2021  8:30 AM [facility]  °06/08/2021  1:15 PM [facility]  °06/19/2021 10:00 AM Kirsteins, Andrew E, MD [facility]  °06/22/2021 11:20 AM McLean, Dalton S, MD [facility] None  °06/26/2021  9:30 AM Penumalli, Vikram R, MD [facility] None  °07/17/2021  9:40 AM Gherghe, Cristina, MD [facility] None  °08/03/2021 11:45 AM Lambert, Cameron T, MD [facility]  ° ° ° °ACTION: °Home visit completed ° ° ° ° ° ° °

## 2021-06-06 NOTE — Telephone Encounter (Signed)
OK for tooth extraction

## 2021-06-07 NOTE — Telephone Encounter (Signed)
Heather aware and will review with patient

## 2021-06-08 ENCOUNTER — Other Ambulatory Visit: Payer: Self-pay

## 2021-06-08 ENCOUNTER — Ambulatory Visit (INDEPENDENT_AMBULATORY_CARE_PROVIDER_SITE_OTHER): Payer: Medicaid Other | Admitting: *Deleted

## 2021-06-08 ENCOUNTER — Other Ambulatory Visit: Payer: Medicaid Other | Admitting: *Deleted

## 2021-06-08 DIAGNOSIS — I513 Intracardiac thrombosis, not elsewhere classified: Secondary | ICD-10-CM

## 2021-06-08 DIAGNOSIS — Z5181 Encounter for therapeutic drug level monitoring: Secondary | ICD-10-CM

## 2021-06-08 DIAGNOSIS — E785 Hyperlipidemia, unspecified: Secondary | ICD-10-CM | POA: Diagnosis not present

## 2021-06-08 DIAGNOSIS — Z7901 Long term (current) use of anticoagulants: Secondary | ICD-10-CM | POA: Diagnosis not present

## 2021-06-08 DIAGNOSIS — I639 Cerebral infarction, unspecified: Secondary | ICD-10-CM

## 2021-06-08 LAB — POCT INR: INR: 2 (ref 2.0–3.0)

## 2021-06-08 NOTE — Patient Instructions (Signed)
Description   -Take 1.5 tablets of warfarin today -Then continue to take warfarin 1.5 tablets daily except for 1 tablet on Mondays, Wednesday and Fridays.  Recheck INR in 3 weeks.  Coumadin Clinic [phone]

## 2021-06-09 LAB — LIPID PANEL
Chol/HDL Ratio: 2.2 ratio (ref 0.0–5.0)
Cholesterol, Total: 107 mg/dL (ref 100–199)
HDL: 49 mg/dL (ref >39)
LDL Chol Calc (NIH): 31 mg/dL (ref 0–99)
Triglycerides: 166 mg/dL — ABNORMAL HIGH (ref 0–149)
VLDL Cholesterol Cal: 27 mg/dL (ref 5–40)

## 2021-06-09 LAB — APOLIPOPROTEIN B: Apolipoprotein B: 43 mg/dL (ref <90)

## 2021-06-11 DIAGNOSIS — Z0271 Encounter for disability determination: Secondary | ICD-10-CM

## 2021-06-12 ENCOUNTER — Other Ambulatory Visit: Payer: Self-pay | Admitting: Internal Medicine

## 2021-06-12 DIAGNOSIS — E1142 Type 2 diabetes mellitus with diabetic polyneuropathy: Secondary | ICD-10-CM

## 2021-06-12 DIAGNOSIS — Z794 Long term (current) use of insulin: Secondary | ICD-10-CM

## 2021-06-13 NOTE — Telephone Encounter (Signed)
Requested Prescriptions  Pending Prescriptions Disp Refills   metFORMIN (GLUCOPHAGE) 500 MG tablet [Pharmacy Med Name: METFORMIN HCL 500 MG ORAL TABLET] 60 tablet 2    Sig: TAKE 1 TABLET (500 MG TOTAL) BY MOUTH 2 (TWO) TIMES DAILY WITH A MEAL.     Endocrinology:  Diabetes - Biguanides Failed - 06/12/2021  6:05 PM      Failed - B12 Level in normal range and within 720 days    Vitamin B-12  Date Value Ref Range Status  11/26/2019 1,427 (H) 232 - 1,245 pg/mL Final         Passed - Cr in normal range and within 360 days    Creat  Date Value Ref Range Status  04/17/2016 0.80 0.60 - 1.35 mg/dL Final   Creatinine, Ser  Date Value Ref Range Status  04/03/2021 0.85 0.61 - 1.24 mg/dL Final   Creatinine, Urine  Date Value Ref Range Status  04/17/2016 413 (H) 20 - 370 mg/dL Final    Comment:    Result repeated and verified. Result confirmed by automatic dilution.          Passed - HBA1C is between 0 and 7.9 and within 180 days    HbA1c, POC (controlled diabetic range)  Date Value Ref Range Status  04/26/2019 14.2 (A) 0.0 - 7.0 % Final   Hgb A1c MFr Bld  Date Value Ref Range Status  04/19/2021 7.7 (H) 4.6 - 6.5 % Final    Comment:    Glycemic Control Guidelines for People with Diabetes:Non Diabetic:  <6%Goal of Therapy: <7%Additional Action Suggested:  >8%          Passed - eGFR in normal range and within 360 days    GFR, Est African American  Date Value Ref Range Status  04/17/2016 >89 >=60 mL/min Final   GFR calc Af Amer  Date Value Ref Range Status  08/04/2019 138 >59 mL/min/1.73 Final   GFR, Est Non African American  Date Value Ref Range Status  04/17/2016 >89 >=60 mL/min Final   GFR, Estimated  Date Value Ref Range Status  04/03/2021 >60 >60 mL/min Final    Comment:    (NOTE) Calculated using the CKD-EPI Creatinine Equation (2021)          Passed - Valid encounter within last 6 months    Recent Outpatient Visits          2 months ago Type 2 diabetes  mellitus with diabetic polyneuropathy, with long-term current use of insulin ([name])   [place], Annie Main L, RPH-CPP   3 months ago Type 2 diabetes mellitus with diabetic polyneuropathy, with long-term current use of insulin ([place])   [place], Annie Main L, RPH-CPP   4 months ago Type 2 diabetes mellitus with diabetic polyneuropathy, with long-term current use of insulin ([place])   [place], Neoma Laming B, MD   7 months ago Type 2 diabetes mellitus with diabetic polyneuropathy, with long-term current use of insulin [facility])   [place], Deborah B, MD   1 year ago Type 2 diabetes mellitus with diabetic polyneuropathy, with long-term current use of insulin [facility])   [place], Deborah B, MD      Future Appointments            In 1 month Vickie Epley, MD [facility], [facility]  Passed - CBC within normal limits and completed in the last 12 months    WBC  Date Value Ref Range Status  02/05/2021 5.1 3.4 - 10.8 x10E3/uL Final  12/28/2020 4.2 4.0 - 10.5 K/uL Final   RBC  Date Value Ref Range Status  02/05/2021 4.36 4.14 - 5.80 x10E6/uL Final  12/28/2020 4.40 4.22 - 5.81 MIL/uL Final   Hemoglobin  Date Value Ref Range Status  02/05/2021 12.6 (L) 13.0 - 17.7 g/dL Final   Hematocrit  Date Value Ref Range Status  02/05/2021 36.7 (L) 37.5 - 51.0 % Final   MCHC  Date Value Ref Range Status  02/05/2021 34.3 31.5 - 35.7 g/dL Final  12/28/2020 33.6 30.0 - 36.0 g/dL Final   [facility]  Date Value Ref Range Status  02/05/2021 28.9 26.6 - 33.0 pg Final  12/28/2020 29.1 26.0 - 34.0 pg Final   MCV  Date Value Ref Range Status  02/05/2021 84 79 - 97 fL Final   No results found for: PLTCOUNTKUC, LABPLAT, POCPLA RDW  Date Value Ref Range Status  02/05/2021 10.9 (L)  11.6 - 15.4 % Final          metoprolol (TOPROL-XL) 200 MG 24 hr tablet [Pharmacy Med Name: METOPROLOL SUCCINATE ER 200 MG ORAL TABLET EXTENDED RELEASE 24 HOUR] 30 tablet 2    Sig: TAKE 0.5 TABLETS (100 MG TOTAL) BY MOUTH DAILY. TAKE WITH OR IMMEDIATELY FOLLOWING A MEAL.     Cardiovascular:  Beta Blockers Passed - 06/12/2021  6:05 PM      Passed - Last BP in normal range    BP Readings from Last 1 Encounters:  06/06/21 102/68         Passed - Last Heart Rate in normal range    Pulse Readings from Last 1 Encounters:  06/06/21 66         Passed - Valid encounter within last 6 months    Recent Outpatient Visits          2 months ago Type 2 diabetes mellitus with diabetic polyneuropathy, with long-term current use of insulin ([place])   [place], Annie Main L, RPH-CPP   3 months ago Type 2 diabetes mellitus with diabetic polyneuropathy, with long-term current use of insulin [facility])   [place], Annie Main L, RPH-CPP   4 months ago Type 2 diabetes mellitus with diabetic polyneuropathy, with long-term current use of insulin ([place])   [place], Neoma Laming B, MD   7 months ago Type 2 diabetes mellitus with diabetic polyneuropathy, with long-term current use of insulin ([place])   [place], Deborah B, MD   1 year ago Type 2 diabetes mellitus with diabetic polyneuropathy, with long-term current use of insulin ([place])   [place], MD      Future Appointments            In 1 month Vickie Epley, MD [place] Office, [facility]            atorvastatin (LIPITOR) 80 MG tablet [Pharmacy Med Name: ATORVASTATIN CALCIUM 80 MG ORAL TABLET] 30 tablet 2    Sig: TAKE 1 TABLET (80 MG TOTAL) BY MOUTH DAILY.     Cardiovascular:  Antilipid - Statins Failed - 06/12/2021  6:05 PM       Failed - Lipid Panel in normal range within the  last 12 months    Cholesterol, Total  Date Value Ref Range Status  06/08/2021 107 100 - 199 mg/dL Final   LDL Chol Calc (NIH)  Date Value Ref Range Status  06/08/2021 31 0 - 99 mg/dL Final   HDL  Date Value Ref Range Status  06/08/2021 49 >39 mg/dL Final   Triglycerides  Date Value Ref Range Status  06/08/2021 166 (H) 0 - 149 mg/dL Final         Passed - Patient is not pregnant      Passed - Valid encounter within last 12 months    Recent Outpatient Visits          2 months ago Type 2 diabetes mellitus with diabetic polyneuropathy, with long-term current use of insulin ([place])   [place], Stephen L, RPH-CPP   3 months ago Type 2 diabetes mellitus with diabetic polyneuropathy, with long-term current use of insulin [facility])   [place], Annie Main L, RPH-CPP   4 months ago Type 2 diabetes mellitus with diabetic polyneuropathy, with long-term current use of insulin ([place])   [place], Neoma Laming B, MD   7 months ago Type 2 diabetes mellitus with diabetic polyneuropathy, with long-term current use of insulin ([place])   [place], Deborah B, MD   1 year ago Type 2 diabetes mellitus with diabetic polyneuropathy, with long-term current use of insulin [facility])   [place], MD      Future Appointments            In 1 month Vickie Epley, MD [facility] [address] Office, [facility]            digoxin (LANOXIN) 0.125 MG tablet [Pharmacy Med Name: DIGOXIN 125 MCG ORAL TABLET] 30 tablet 2    Sig: TAKE 1 TABLET (0.125 MG TOTAL) BY MOUTH DAILY.     Cardiovascular:  Antiarrhythmic Agents - digoxin Passed - 06/12/2021  6:05 PM      Passed - Cr in normal range and within 180 days    Creat  Date Value Ref Range Status   04/17/2016 0.80 0.60 - 1.35 mg/dL Final   Creatinine, Ser  Date Value Ref Range Status  04/03/2021 0.85 0.61 - 1.24 mg/dL Final   Creatinine, Urine  Date Value Ref Range Status  04/17/2016 413 (H) 20 - 370 mg/dL Final    Comment:    Result repeated and verified. Result confirmed by automatic dilution.          Passed - Digoxin (serum) in normal range and within 360 days    Digoxin Level  Date Value Ref Range Status  03/02/2021 0.5 (L) 0.8 - 2.0 ng/mL Final    Comment:    Performed at [facility], [place] [address], [place], [place] [address]         Passed - Ca in normal range and within 360 days    Calcium  Date Value Ref Range Status  04/03/2021 9.0 8.9 - 10.3 mg/dL Final   Calcium, Ion  Date Value Ref Range Status  11/02/2020 1.19 1.15 - 1.40 mmol/L Final         Passed - K in normal range and within 180 days    Potassium  Date Value Ref Range Status  04/03/2021 3.8 3.5 - 5.1 mmol/L Final  Passed - Mg Level in normal range and within 360 days    Magnesium  Date Value Ref Range Status  09/19/2020 1.9 1.7 - 2.4 mg/dL Final    Comment:    Performed at [facility], [place] [address], [place], [place] [address]         Passed - Patient had ECG in the last 360 days      Passed - Last Heart Rate in normal range    Pulse Readings from Last 1 Encounters:  06/06/21 66         Passed - Valid encounter within last 6 months    Recent Outpatient Visits          2 months ago Type 2 diabetes mellitus with diabetic polyneuropathy, with long-term current use of insulin [facility])   [place], Annie Main L, RPH-CPP   3 months ago Type 2 diabetes mellitus with diabetic polyneuropathy, with long-term current use of insulin [facility])   [place], Annie Main L, RPH-CPP   4 months ago Type 2 diabetes mellitus with diabetic polyneuropathy, with long-term current use of insulin ([place])    [place], Neoma Laming B, MD   7 months ago Type 2 diabetes mellitus with diabetic polyneuropathy, with long-term current use of insulin ([place])   [place], Deborah B, MD   1 year ago Type 2 diabetes mellitus with diabetic polyneuropathy, with long-term current use of insulin ([place])   [place], MD      Future Appointments            In 1 month Vickie Epley, MD [place] Office, [facility]            ezetimibe (ZETIA) 10 MG tablet [Pharmacy Med Name: EZETIMIBE 10 MG ORAL TABLET] 30 tablet 2    Sig: TAKE 1 TABLET (10 MG TOTAL) BY MOUTH DAILY.     Cardiovascular:  Antilipid - Sterol Transport Inhibitors Failed - 06/12/2021  6:05 PM      Failed - Lipid Panel in normal range within the last 12 months    Cholesterol, Total  Date Value Ref Range Status  06/08/2021 107 100 - 199 mg/dL Final   LDL Chol Calc (NIH)  Date Value Ref Range Status  06/08/2021 31 0 - 99 mg/dL Final   HDL  Date Value Ref Range Status  06/08/2021 49 >39 mg/dL Final   Triglycerides  Date Value Ref Range Status  06/08/2021 166 (H) 0 - 149 mg/dL Final         Passed - AST in normal range and within 360 days    AST  Date Value Ref Range Status  09/22/2020 36 15 - 41 U/L Final         Passed - ALT in normal range and within 360 days    ALT  Date Value Ref Range Status  09/22/2020 42 0 - 44 U/L Final         Passed - Patient is not pregnant      Passed - Valid encounter within last 12 months    Recent Outpatient Visits          2 months ago Type 2 diabetes mellitus with diabetic polyneuropathy, with long-term current use of insulin [facility])   [place], RPH-CPP  3 months ago Type 2 diabetes mellitus with diabetic polyneuropathy, with long-term current use of insulin [facility])   [place], Annie Main L, RPH-CPP   4 months ago Type 2 diabetes mellitus with diabetic polyneuropathy, with long-term current use of insulin [facility])   [place] Karle Plumber B, MD   7 months ago Type 2 diabetes mellitus with diabetic polyneuropathy, with long-term current use of insulin [facility])   [place], Deborah B, MD   1 year ago Type 2 diabetes mellitus with diabetic polyneuropathy, with long-term current use of insulin [facility])   [place], Deborah B, MD      Future Appointments            In 1 month Vickie Epley, MD [facility] [place] [facility], [facility]

## 2021-06-18 DIAGNOSIS — R918 Other nonspecific abnormal finding of lung field: Secondary | ICD-10-CM | POA: Diagnosis not present

## 2021-06-18 DIAGNOSIS — Z01810 Encounter for preprocedural cardiovascular examination: Secondary | ICD-10-CM | POA: Diagnosis not present

## 2021-06-19 ENCOUNTER — Encounter: Payer: Medicaid Other | Attending: Physical Medicine & Rehabilitation | Admitting: Physical Medicine & Rehabilitation

## 2021-06-19 DIAGNOSIS — Z008 Encounter for other general examination: Secondary | ICD-10-CM | POA: Diagnosis not present

## 2021-06-19 DIAGNOSIS — I5181 Takotsubo syndrome: Secondary | ICD-10-CM | POA: Diagnosis not present

## 2021-06-19 DIAGNOSIS — R9431 Abnormal electrocardiogram [ECG] [EKG]: Secondary | ICD-10-CM | POA: Diagnosis not present

## 2021-06-19 DIAGNOSIS — Z01818 Encounter for other preprocedural examination: Secondary | ICD-10-CM | POA: Diagnosis not present

## 2021-06-19 DIAGNOSIS — R001 Bradycardia, unspecified: Secondary | ICD-10-CM | POA: Diagnosis not present

## 2021-06-19 DIAGNOSIS — Z01810 Encounter for preprocedural cardiovascular examination: Secondary | ICD-10-CM | POA: Diagnosis not present

## 2021-06-19 DIAGNOSIS — E119 Type 2 diabetes mellitus without complications: Secondary | ICD-10-CM | POA: Diagnosis not present

## 2021-06-19 DIAGNOSIS — Z113 Encounter for screening for infections with a predominantly sexual mode of transmission: Secondary | ICD-10-CM | POA: Diagnosis not present

## 2021-06-19 DIAGNOSIS — Z1159 Encounter for screening for other viral diseases: Secondary | ICD-10-CM | POA: Diagnosis not present

## 2021-06-20 DIAGNOSIS — Z8673 Personal history of transient ischemic attack (TIA), and cerebral infarction without residual deficits: Secondary | ICD-10-CM | POA: Diagnosis not present

## 2021-06-20 DIAGNOSIS — Z1159 Encounter for screening for other viral diseases: Secondary | ICD-10-CM | POA: Diagnosis not present

## 2021-06-20 DIAGNOSIS — Z01818 Encounter for other preprocedural examination: Secondary | ICD-10-CM | POA: Diagnosis not present

## 2021-06-20 DIAGNOSIS — Z01810 Encounter for preprocedural cardiovascular examination: Secondary | ICD-10-CM | POA: Diagnosis not present

## 2021-06-20 DIAGNOSIS — Z7901 Long term (current) use of anticoagulants: Secondary | ICD-10-CM | POA: Diagnosis not present

## 2021-06-20 DIAGNOSIS — Z8679 Personal history of other diseases of the circulatory system: Secondary | ICD-10-CM | POA: Diagnosis not present

## 2021-06-20 DIAGNOSIS — E785 Hyperlipidemia, unspecified: Secondary | ICD-10-CM | POA: Diagnosis not present

## 2021-06-20 DIAGNOSIS — E1140 Type 2 diabetes mellitus with diabetic neuropathy, unspecified: Secondary | ICD-10-CM | POA: Diagnosis not present

## 2021-06-20 DIAGNOSIS — Z8669 Personal history of other diseases of the nervous system and sense organs: Secondary | ICD-10-CM | POA: Diagnosis not present

## 2021-06-20 DIAGNOSIS — Z23 Encounter for immunization: Secondary | ICD-10-CM | POA: Diagnosis not present

## 2021-06-20 DIAGNOSIS — Z113 Encounter for screening for infections with a predominantly sexual mode of transmission: Secondary | ICD-10-CM | POA: Diagnosis not present

## 2021-06-20 DIAGNOSIS — I5022 Chronic systolic (congestive) heart failure: Secondary | ICD-10-CM | POA: Diagnosis not present

## 2021-06-20 DIAGNOSIS — I110 Hypertensive heart disease with heart failure: Secondary | ICD-10-CM | POA: Diagnosis not present

## 2021-06-20 DIAGNOSIS — Z794 Long term (current) use of insulin: Secondary | ICD-10-CM | POA: Diagnosis not present

## 2021-06-20 DIAGNOSIS — Z7682 Awaiting organ transplant status: Secondary | ICD-10-CM | POA: Diagnosis not present

## 2021-06-20 DIAGNOSIS — I428 Other cardiomyopathies: Secondary | ICD-10-CM | POA: Diagnosis not present

## 2021-06-21 DIAGNOSIS — Z7682 Awaiting organ transplant status: Secondary | ICD-10-CM | POA: Diagnosis not present

## 2021-06-21 DIAGNOSIS — I428 Other cardiomyopathies: Secondary | ICD-10-CM | POA: Diagnosis not present

## 2021-06-21 DIAGNOSIS — I5022 Chronic systolic (congestive) heart failure: Secondary | ICD-10-CM | POA: Diagnosis not present

## 2021-06-21 DIAGNOSIS — Z01810 Encounter for preprocedural cardiovascular examination: Secondary | ICD-10-CM | POA: Diagnosis not present

## 2021-06-21 HISTORY — PX: RIGHT HEART CATH: SHX6075

## 2021-06-22 ENCOUNTER — Encounter (HOSPITAL_COMMUNITY): Payer: Medicaid Other | Admitting: Cardiology

## 2021-06-22 DIAGNOSIS — Z113 Encounter for screening for infections with a predominantly sexual mode of transmission: Secondary | ICD-10-CM | POA: Diagnosis not present

## 2021-06-22 DIAGNOSIS — Z1159 Encounter for screening for other viral diseases: Secondary | ICD-10-CM | POA: Diagnosis not present

## 2021-06-22 DIAGNOSIS — Z01810 Encounter for preprocedural cardiovascular examination: Secondary | ICD-10-CM | POA: Diagnosis not present

## 2021-06-26 ENCOUNTER — Other Ambulatory Visit: Payer: Self-pay | Admitting: Physical Medicine & Rehabilitation

## 2021-06-26 ENCOUNTER — Encounter: Payer: Self-pay | Admitting: Pharmacist

## 2021-06-26 ENCOUNTER — Ambulatory Visit (INDEPENDENT_AMBULATORY_CARE_PROVIDER_SITE_OTHER): Payer: Medicaid Other | Admitting: Diagnostic Neuroimaging

## 2021-06-26 ENCOUNTER — Other Ambulatory Visit (HOSPITAL_COMMUNITY): Payer: Self-pay

## 2021-06-26 ENCOUNTER — Encounter: Payer: Self-pay | Admitting: Diagnostic Neuroimaging

## 2021-06-26 VITALS — BP 112/74 | HR 65 | Ht 71.0 in | Wt 162.6 lb

## 2021-06-26 DIAGNOSIS — G40909 Epilepsy, unspecified, not intractable, without status epilepticus: Secondary | ICD-10-CM

## 2021-06-26 DIAGNOSIS — I63413 Cerebral infarction due to embolism of bilateral middle cerebral arteries: Secondary | ICD-10-CM | POA: Diagnosis not present

## 2021-06-26 DIAGNOSIS — E1142 Type 2 diabetes mellitus with diabetic polyneuropathy: Secondary | ICD-10-CM

## 2021-06-26 MED ORDER — OXCARBAZEPINE 300 MG PO TABS: 600.0000 mg | ORAL_TABLET | Freq: Two times a day (BID) | ORAL | 4 refills | Status: DC

## 2021-06-26 MED ORDER — PHENYTOIN SODIUM EXTENDED 100 MG PO CAPS: 100.0000 mg | ORAL_CAPSULE | Freq: Three times a day (TID) | ORAL | 4 refills | Status: DC

## 2021-06-26 MED ORDER — LEVETIRACETAM 750 MG PO TABS: 1500.0000 mg | ORAL_TABLET | Freq: Two times a day (BID) | ORAL | 4 refills | Status: DC

## 2021-06-26 NOTE — Progress Notes (Signed)
? ?[facility] ? ?PATIENT: Jason Stevenson ?DOB: Mar 01, 1986 ? ?REFERRING CLINICIAN: Ladell Pier, MD ?HISTORY FROM: patient and wife ?REASON FOR VISIT: follow up ? ? ?HISTORICAL ? ?CHIEF COMPLAINT:  ?Chief Complaint  ?Patient presents with  ? Cerebrovascular Accident  ?  Rm 6, 6 month FU  wife- Felicity Coyer  "hypoglycemic seizure early this year"  ? ? ?HISTORY OF PRESENT ILLNESS:  ? ?UPDATE (06/26/21, VRP): Since last visit, doing well, except for low BG seizure in Dec 2022. No other events.  ? ?PRIOR HPI (12/20/20, VRP): 36 year old male with diabetes, nonischemic cardiomyopathy, CHF, hypertension, hyperlipidemia, presented to hospital on 09/11/2020 for left-sided twitching.  Blood glucose was greater than 600.  MRI showed multiple right frontal and left parietal subacute hemorrhagic infarcts, and right frontotemporal gyriform diffusion abnormality possibly related to seizure related changes.  Patient was treated for seizure and stroke prevention.  He was discharged on Coumadin anticoagulation and Keppra, Dilantin, Trileptal. He was again admitted to the hospital in December 16, 2020 for syncope. ? ? ?REVIEW OF SYSTEMS: Full 14 system review of systems performed and negative with exception of: as per HPI. ? ?ALLERGIES: ?No Known Allergies ? ?HOME MEDICATIONS: ?Outpatient Medications Prior to Visit  ?Medication Sig Dispense Refill  ? Accu-Chek Softclix Lancets lancets Use to check blood sugar three times daily. 100 each 3  ? atorvastatin (LIPITOR) 80 MG tablet TAKE 1 TABLET (80 MG TOTAL) BY MOUTH DAILY. 30 tablet 2  ? Blood Glucose Monitoring Suppl (ACCU-CHEK GUIDE) w/Device KIT Use to check blood sugar three times daily. 1 kit 0  ? Blood Pressure Monitor DEVI Use as directed to check home blood pressure 2-3 times a week 1 Device 0  ? Continuous Blood Gluc Receiver (FREESTYLE LIBRE 2 READER) DEVI 1 each by Does not apply route daily. 1 each 0  ? Continuous Blood Gluc Sensor (FREESTYLE LIBRE 2 SENSOR)  MISC 1 each by Does not apply route every 14 (fourteen) days. 6 each 3  ? dapagliflozin propanediol (FARXIGA) 5 MG TABS tablet Take 1 tablet (5 mg total) by mouth daily before breakfast. 30 tablet 3  ? digoxin (LANOXIN) 0.125 MG tablet TAKE 1 TABLET (0.125 MG TOTAL) BY MOUTH DAILY. 30 tablet 2  ? DULoxetine (CYMBALTA) 30 MG capsule Take 1 capsule (30 mg total) by mouth daily. 30 capsule 2  ? Evolocumab (REPATHA SURECLICK) 212 MG/ML SOAJ Inject 1 pen into the skin every 14 (fourteen) days. 2 mL 11  ? ezetimibe (ZETIA) 10 MG tablet TAKE 1 TABLET (10 MG TOTAL) BY MOUTH DAILY. 30 tablet 2  ? furosemide (LASIX) 20 MG tablet Take 1 tablet (20 mg total) by mouth daily. 30 tablet 3  ? glucose blood (ACCU-CHEK GUIDE) test strip Use to check blood sugar three times daily. 100 each 2  ? insulin aspart (FIASP FLEXTOUCH) 100 UNIT/ML FlexTouch Pen Inject 5-8 Units into the skin 3 (three) times daily before meals. 30 mL 3  ? insulin degludec (TRESIBA FLEXTOUCH) 200 UNIT/ML FlexTouch Pen Inject 20 Units into the skin daily. 30 mL 3  ? Insulin Pen Needle 32G X 4 MM MISC Use to inject insulin. 200 each 2  ? Insulin Syringe-Needle U-100 (RELION INSULIN SYRINGE) 31G X 15/64" 0.3 ML MISC Use to inject insulin daily. 100 each 11  ? ivabradine (CORLANOR) 7.5 MG TABS tablet Take 1 tablet (7.5 mg total) by mouth 2 (two) times daily with a meal. 60 tablet   ? Menthol, Topical Analgesic, (ICY HOT BACK EX) Apply 1  application topically as needed (neuropathy). Icy hot cream    ? metFORMIN (GLUCOPHAGE) 500 MG tablet TAKE 1 TABLET (500 MG TOTAL) BY MOUTH 2 (TWO) TIMES DAILY WITH A MEAL. 60 tablet 2  ? metoprolol (TOPROL-XL) 200 MG 24 hr tablet TAKE 0.5 TABLETS (100 MG TOTAL) BY MOUTH DAILY. TAKE WITH OR IMMEDIATELY FOLLOWING A MEAL. 30 tablet 2  ? pregabalin (LYRICA) 300 MG capsule Take 1 capsule (300 mg total) by mouth at bedtime. 30 capsule 5  ? sacubitril-valsartan (ENTRESTO) 24-26 MG Take 1 tablet by mouth 2 (two) times daily. 60 tablet 0  ?  spironolactone (ALDACTONE) 25 MG tablet Take 1 tablet (25 mg total) by mouth at bedtime. 90 tablet 3  ? warfarin (COUMADIN) 5 MG tablet Take 1-1.5 tablets (5-7.5 mg total) by mouth daily as directed by Coumadin clinic 40 tablet 3  ? levETIRAcetam (KEPPRA) 750 MG tablet Take 2 tablets (1,500 mg total) by mouth 2 (two) times daily. 120 tablet 3  ? Oxcarbazepine (TRILEPTAL) 300 MG tablet Take 2 tablets (600 mg total) by mouth 2 (two) times daily. 120 tablet 3  ? phenytoin (DILANTIN) 100 MG ER capsule Take 1 capsule (100 mg total) by mouth 3 (three) times daily. 90 capsule 5  ? acetaminophen (TYLENOL) 500 MG tablet Take 1,000 mg by mouth every 8 (eight) hours as needed for mild pain, fever or headache.    ? ibuprofen (ADVIL) 600 MG tablet Take 600 mg by mouth every 6 (six) hours as needed.    ? insulin isophane & regular human KwikPen (NOVOLIN 70/30 KWIKPEN) (70-30) 100 UNIT/ML KwikPen 22 units SQ Q a.m and 16 units Q p.m (Patient not taking: Reported on 04/25/2021) 15 mL 5  ? ?No facility-administered medications prior to visit.  ? ? ?PAST MEDICAL HISTORY: ?Past Medical History:  ?Diagnosis Date  ? Chronic systolic CHF (congestive heart failure) (HCC) 06/18/2015  ? Diabetes mellitus without complication (HCC)   ? Hemorrhagic stroke (HCC)   ? Hyperlipidemia   ? Hypertension   ? Nonischemic cardiomyopathy (HCC) Noted as early as 2007  ? Per chart review (cards consult note 2011), EF of 40% in 2007, down to 20-25% in 2011  ? ? ?PAST SURGICAL HISTORY: ?Past Surgical History:  ?Procedure Laterality Date  ? None    ? RIGHT HEART CATH  06/21/2021  ? [facility]  ? RIGHT/LEFT HEART CATH AND CORONARY ANGIOGRAPHY N/A 11/02/2020  ? Procedure: RIGHT/LEFT HEART CATH AND CORONARY ANGIOGRAPHY;  Surgeon: McLean, Dalton S, MD;  Location: [facility];  Service: Cardiovascular;  Laterality: N/A;  ? ? ?FAMILY HISTORY: ?Family History  ?Problem Relation Age of Onset  ? Stroke Mother   ? Diabetes Mother   ? Hypertension Mother   ?  Stroke Maternal Aunt   ? Heart attack Neg Hx   ? ? ?SOCIAL HISTORY: ?Social History  ? ?Socioeconomic History  ? Marital status: Married  ?  Spouse name: [name]  ? Number of children: 0  ? Years of education: Not on file  ? Highest education level: Not on file  ?Occupational History  ? Occupation: unemployed  ?Tobacco Use  ? Smoking status: Never  ? Smokeless tobacco: Never  ?Vaping Use  ? Vaping Use: Not on file  ?Substance and Sexual Activity  ? Alcohol use: Not Currently  ?  Comment: "occasional" when "hanging out with the wrong people" No recent use.  ? Drug use: Not Currently  ?  Types: Marijuana  ?  Comment: occasional, last 2013  ?   Sexual activity: Not Currently  ?Other Topics Concern  ? Not on file  ?Social History Narrative  ? Pt lives at home with his wife   ? Right handed   ? Caffeine- hardly any  ? ?Social Determinants of Health  ? ?Financial Resource Strain: High Risk  ? Difficulty of Paying Living Expenses: Hard  ?Food Insecurity: No Food Insecurity  ? Worried About Running Out of Food in the Last Year: Never true  ? Ran Out of Food in the Last Year: Never true  ?Transportation Needs: Unmet Transportation Needs  ? Lack of Transportation (Medical): No  ? Lack of Transportation (Non-Medical): Yes  ?Physical Activity: Not on file  ?Stress: Not on file  ?Social Connections: Not on file  ?Intimate Partner Violence: Not on file  ? ? ? ?PHYSICAL EXAM ? ?GENERAL EXAM/CONSTITUTIONAL: ?Vitals:  ?Vitals:  ? 06/26/21 0930  ?BP: 112/74  ?Pulse: 65  ?Weight: 162 lb 9.6 oz (73.8 kg)  ?Height: 5' 11" (1.803 m)  ? ?Body mass index is 22.68 kg/m?. ?Wt Readings from Last 3 Encounters:  ?06/26/21 162 lb 9.6 oz (73.8 kg)  ?06/06/21 159 lb 12.8 oz (72.5 kg)  ?05/23/21 158 lb (71.7 kg)  ? ?Patient is in no distress; well developed, nourished and groomed; neck is supple ? ?CARDIOVASCULAR: ?Examination of carotid arteries is normal; no carotid bruits ?Regular rate and rhythm, no murmurs ?Examination of peripheral vascular  system by observation and palpation is normal ? ?EYES: ?Ophthalmoscopic exam of optic discs and posterior segments is normal; no papilledema or hemorrhages ?No results found. ? ?MUSCULOSKELETAL: ?Gait, st

## 2021-06-29 ENCOUNTER — Other Ambulatory Visit: Payer: Self-pay

## 2021-06-29 ENCOUNTER — Ambulatory Visit (INDEPENDENT_AMBULATORY_CARE_PROVIDER_SITE_OTHER): Payer: Medicaid Other

## 2021-06-29 DIAGNOSIS — I513 Intracardiac thrombosis, not elsewhere classified: Secondary | ICD-10-CM | POA: Diagnosis not present

## 2021-06-29 DIAGNOSIS — Z5181 Encounter for therapeutic drug level monitoring: Secondary | ICD-10-CM | POA: Diagnosis not present

## 2021-06-29 DIAGNOSIS — I639 Cerebral infarction, unspecified: Secondary | ICD-10-CM

## 2021-06-29 DIAGNOSIS — Z7901 Long term (current) use of anticoagulants: Secondary | ICD-10-CM | POA: Diagnosis not present

## 2021-06-29 LAB — POCT INR: INR: 1.2 — AB (ref 2.0–3.0)

## 2021-06-29 NOTE — Patient Instructions (Addendum)
-   take 1.5 tablets warfarin tonight, 2 tablets tomorrow, then ?- continue to take warfarin 1.5 tablets daily except for 1 tablet on Mondays, Wednesday and Fridays.  ?- Recheck INR in 1 week ?C[facility]madin Clinic [phone] ?

## 2021-07-02 ENCOUNTER — Other Ambulatory Visit (HOSPITAL_COMMUNITY): Payer: Self-pay | Admitting: *Deleted

## 2021-07-02 ENCOUNTER — Encounter (HOSPITAL_COMMUNITY): Payer: Self-pay | Admitting: Cardiology

## 2021-07-02 ENCOUNTER — Other Ambulatory Visit (HOSPITAL_COMMUNITY): Payer: Self-pay

## 2021-07-02 NOTE — Progress Notes (Signed)
Paramedicine Encounter ? ? ? Patient ID: Jason Stevenson, male    DOB: Jan 11, 1986, 36 y.o.   MRN: [id number] ? ? ?Arrived for home visit for [name] who reports feeling good. He denied chest pain, dizziness or shortness of breath. He states he was seen at [facility] last week for transplant consultation. He said it went well and that he feels much more at ease about the process and is hopeful about the whole situation. He was given a referral for cardaic rehab, I will follow up with HF team about same. (Update I see he sent in message about same and Aundra Dubin acknowledged it) Vitals and assessment as noted. No weight gain, no lower leg edema. Lungs clear. CBG 160 He reports taking all medications and prescribed and his wife has been filling his pill box with no issues. Refills already called in by her. I plan to follow up in one week in the home. Appointments were confirmed and he will set up rides with Medicaid.  ? ?Home visit complete.  ? ? ? ?Patient Care Team: ?Ladell Pier, MD as PCP - General (Internal Medicine) ?Josue Hector, MD as PCP - Cardiology (Cardiology) ?Vickie Epley, MD as PCP - Electrophysiology (Cardiology) ? ?Patient Active Problem List  ? Diagnosis Date Noted  ? LADA (latent autoimmune diabetes in adults), managed as type 2 ([place]) 03/28/2021  ? Poorly controlled type 2 diabetes mellitus with circulatory disorder ([place]) 03/19/2021  ? Ischemic cardiomyopathy   ? Syncope 12/14/2020  ? Chest pain 10/30/2020  ? History of cerebrovascular accident (CVA) with residual deficit 10/27/2020  ? Protein-calorie malnutrition, severe 09/22/2020  ? Acute CVA (cerebrovascular accident) ([place]) 09/21/2020  ? Near syncope 09/21/2020  ? Encounter for monitoring Coumadin therapy 09/20/2020  ? LV (left ventricular) mural thrombus 09/13/2020  ? Seizure ([place]) 09/13/2020  ? Neurological deficit present 09/12/2020  ? History of COVID-19 09/12/2020  ? Intracerebral hemorrhage 09/12/2020  ? Acute cerebrovascular accident  (CVA) ([place]) 09/12/2020  ? COVID-19 08/23/2020  ? Noncompliance with medication treatment due to intermittent use of medication 10/12/2018  ? Dyslipidemia 04/17/2016  ? Chronic systolic CHF (congestive heart failure) ([place]) 06/18/2015  ? Needs flu shot 12/23/2013  ? Non-ischemic cardiomyopathy ([place]) 11/06/2012  ? HTN (hypertension) 11/06/2012  ? HLD (hyperlipidemia) 11/06/2012  ? ? ?Current Outpatient Medications:  ?  Accu-Chek Softclix Lancets lancets, Use to check blood sugar three times daily., Disp: 100 each, Rfl: 3 ?  atorvastatin (LIPITOR) 80 MG tablet, TAKE 1 TABLET (80 MG TOTAL) BY MOUTH DAILY., Disp: 30 tablet, Rfl: 2 ?  Blood Glucose Monitoring Suppl (ACCU-CHEK GUIDE) w/Device KIT, Use to check blood sugar three times daily., Disp: 1 kit, Rfl: 0 ?  Blood Pressure Monitor DEVI, Use as directed to check home blood pressure 2-3 times a week, Disp: 1 Device, Rfl: 0 ?  Continuous Blood Gluc Receiver (FREESTYLE LIBRE 2 READER) DEVI, 1 each by Does not apply route daily., Disp: 1 each, Rfl: 0 ?  Continuous Blood Gluc Sensor (FREESTYLE LIBRE 2 SENSOR) MISC, 1 each by Does not apply route every 14 (fourteen) days., Disp: 6 each, Rfl: 3 ?  dapagliflozin propanediol (FARXIGA) 5 MG TABS tablet, Take 1 tablet (5 mg total) by mouth daily before breakfast., Disp: 30 tablet, Rfl: 3 ?  digoxin (LANOXIN) 0.125 MG tablet, TAKE 1 TABLET (0.125 MG TOTAL) BY MOUTH DAILY., Disp: 30 tablet, Rfl: 2 ?  DULoxetine (CYMBALTA) 30 MG capsule, TAKE 1 CAPSULE (30 MG TOTAL) BY MOUTH DAILY., Disp: 30 capsule,  Rfl: 2 ?  Evolocumab (REPATHA SURECLICK) 937 MG/ML SOAJ, Inject 1 pen into the skin every 14 (fourteen) days., Disp: 2 mL, Rfl: 11 ?  ezetimibe (ZETIA) 10 MG tablet, TAKE 1 TABLET (10 MG TOTAL) BY MOUTH DAILY., Disp: 30 tablet, Rfl: 2 ?  furosemide (LASIX) 20 MG tablet, Take 1 tablet (20 mg total) by mouth daily., Disp: 30 tablet, Rfl: 3 ?  glucose blood (ACCU-CHEK GUIDE) test strip, Use to check blood sugar three times daily., Disp:  100 each, Rfl: 2 ?  insulin aspart (FIASP FLEXTOUCH) 100 UNIT/ML FlexTouch Pen, Inject 5-8 Units into the skin 3 (three) times daily before meals., Disp: 30 mL, Rfl: 3 ?  insulin degludec (TRESIBA FLEXTOUCH) 200 UNIT/ML FlexTouch Pen, Inject 20 Units into the skin daily., Disp: 30 mL, Rfl: 3 ?  Insulin Pen Needle 32G X 4 MM MISC, Use to inject insulin., Disp: 200 each, Rfl: 2 ?  Insulin Syringe-Needle U-100 (RELION INSULIN SYRINGE) 31G X 15/64" 0.3 ML MISC, Use to inject insulin daily., Disp: 100 each, Rfl: 11 ?  ivabradine (CORLANOR) 7.5 MG TABS tablet, Take 1 tablet (7.5 mg total) by mouth 2 (two) times daily with a meal., Disp: 60 tablet, Rfl:  ?  levETIRAcetam (KEPPRA) 750 MG tablet, Take 2 tablets (1,500 mg total) by mouth 2 (two) times daily., Disp: 360 tablet, Rfl: 4 ?  Menthol, Topical Analgesic, (ICY HOT BACK EX), Apply 1 application topically as needed (neuropathy). Icy hot cream, Disp: , Rfl:  ?  metFORMIN (GLUCOPHAGE) 500 MG tablet, TAKE 1 TABLET (500 MG TOTAL) BY MOUTH 2 (TWO) TIMES DAILY WITH A MEAL., Disp: 60 tablet, Rfl: 2 ?  metoprolol (TOPROL-XL) 200 MG 24 hr tablet, TAKE 0.5 TABLETS (100 MG TOTAL) BY MOUTH DAILY. TAKE WITH OR IMMEDIATELY FOLLOWING A MEAL., Disp: 30 tablet, Rfl: 2 ?  Oxcarbazepine (TRILEPTAL) 300 MG tablet, Take 2 tablets (600 mg total) by mouth 2 (two) times daily., Disp: 360 tablet, Rfl: 4 ?  phenytoin (DILANTIN) 100 MG ER capsule, Take 1 capsule (100 mg total) by mouth 3 (three) times daily., Disp: 270 capsule, Rfl: 4 ?  pregabalin (LYRICA) 300 MG capsule, Take 1 capsule (300 mg total) by mouth at bedtime., Disp: 30 capsule, Rfl: 5 ?  sacubitril-valsartan (ENTRESTO) 24-26 MG, Take 1 tablet by mouth 2 (two) times daily., Disp: 60 tablet, Rfl: 0 ?  spironolactone (ALDACTONE) 25 MG tablet, Take 1 tablet (25 mg total) by mouth at bedtime., Disp: 90 tablet, Rfl: 3 ?  warfarin (COUMADIN) 5 MG tablet, Take 1-1.5 tablets (5-7.5 mg total) by mouth daily as directed by Coumadin clinic,  Disp: 40 tablet, Rfl: 3 ?No Known Allergies ? ? ?Social History  ? ?Socioeconomic History  ? Marital status: Married  ?  Spouse name: Felicity Coyer  ? Number of children: 0  ? Years of education: Not on file  ? Highest education level: Not on file  ?Occupational History  ? Occupation: unemployed  ?Tobacco Use  ? Smoking status: Never  ? Smokeless tobacco: Never  ?Vaping Use  ? Vaping Use: Not on file  ?Substance and Sexual Activity  ? Alcohol use: Not Currently  ?  Comment: "occasional" when "hanging out with the wrong people" No recent use.  ? Drug use: Not Currently  ?  Types: Marijuana  ?  Comment: occasional, last 2013  ? Sexual activity: Not Currently  ?Other Topics Concern  ? Not on file  ?Social History Narrative  ? Pt lives at home with his wife   ?  Right handed   ? Caffeine- hardly any  ? ?Social Determinants of Health  ? ?Financial Resource Strain: High Risk  ? Difficulty of Paying Living Expenses: Hard  ?Food Insecurity: No Food Insecurity  ? Worried About Charity fundraiser in the Last Year: Never true  ? Ran Out of Food in the Last Year: Never true  ?Transportation Needs: Unmet Transportation Needs  ? Lack of Transportation (Medical): No  ? Lack of Transportation (Non-Medical): Yes  ?Physical Activity: Not on file  ?Stress: Not on file  ?Social Connections: Not on file  ?Intimate Partner Violence: Not on file  ? ? ?Physical Exam ?Vitals reviewed.  ?Constitutional:   ?   Appearance: Normal appearance. He is normal weight.  ?HENT:  ?   Head: Normocephalic.  ?   Nose: Nose normal.  ?   Mouth/Throat:  ?   Mouth: Mucous membranes are moist.  ?   Pharynx: Oropharynx is clear.  ?Eyes:  ?   Conjunctiva/sclera: Conjunctivae normal.  ?   Pupils: Pupils are equal, round, and reactive to light.  ?Cardiovascular:  ?   Rate and Rhythm: Normal rate and regular rhythm.  ?   Pulses: Normal pulses.  ?   Heart sounds: Normal heart sounds.  ?Pulmonary:  ?   Effort: Pulmonary effort is normal.  ?   Breath sounds: Normal breath  sounds.  ?Abdominal:  ?   General: Abdomen is flat.  ?   Palpations: Abdomen is soft.  ?Musculoskeletal:     ?   General: No swelling. Normal range of motion.  ?   Cervical back: Normal range of motion.  ?

## 2021-07-02 NOTE — Telephone Encounter (Signed)
Referral placed.

## 2021-07-03 ENCOUNTER — Telehealth (HOSPITAL_COMMUNITY): Payer: Self-pay

## 2021-07-03 NOTE — Telephone Encounter (Signed)
error 

## 2021-07-06 ENCOUNTER — Other Ambulatory Visit: Payer: Self-pay

## 2021-07-06 ENCOUNTER — Ambulatory Visit (INDEPENDENT_AMBULATORY_CARE_PROVIDER_SITE_OTHER): Payer: Medicaid Other

## 2021-07-06 DIAGNOSIS — Z7901 Long term (current) use of anticoagulants: Secondary | ICD-10-CM

## 2021-07-06 DIAGNOSIS — Z5181 Encounter for therapeutic drug level monitoring: Secondary | ICD-10-CM | POA: Diagnosis not present

## 2021-07-06 DIAGNOSIS — I513 Intracardiac thrombosis, not elsewhere classified: Secondary | ICD-10-CM | POA: Diagnosis not present

## 2021-07-06 DIAGNOSIS — I639 Cerebral infarction, unspecified: Secondary | ICD-10-CM

## 2021-07-06 LAB — POCT INR: INR: 3.2 — AB (ref 2.0–3.0)

## 2021-07-06 NOTE — Patient Instructions (Signed)
Description   ?- Only take 0.5 tablet today, then ?- Continue to take warfarin 1.5 tablets daily except for 1 tablet on Mondays, Wednesday and Fridays.  ?- Recheck INR in 2 weeks.  ?Coumadin Clinic [phone] ?  ?   ?

## 2021-07-10 ENCOUNTER — Other Ambulatory Visit (HOSPITAL_COMMUNITY): Payer: Self-pay

## 2021-07-10 ENCOUNTER — Telehealth (HOSPITAL_COMMUNITY): Payer: Self-pay

## 2021-07-10 NOTE — Telephone Encounter (Signed)
Pt insurance is active through [place]. Ref#[id number] ?  ?Will fax over [facility] Reimbursement form to Dr. Shirlee Latch ?  ?Will contact pt to see if he is interested in the Cardiac Rehab program. If interested, pt will need to complete f/u appt on. Once completed, pt will be contacted for scheduling upon review by the RN Navigator.  ?  ?

## 2021-07-10 NOTE — Telephone Encounter (Signed)
Heather associated with Dr. Shirlee Latch office called wanting to see where pt's cardiac rehab referral was and if we can get pt scheduled. I advised Herbert Seta that we did have his cardiac rehab referral and to see if he is able to get in to Dr. Alford [place] office before 6/9 because he would need to have a follow up before he can be seen in [facility]. Herbert Seta stated that she is going to try and get pt in before 6/9 to be seen. I advised pt that once pt has been reviewed by the nurse navigator we would contact him. I also advised Herbert Seta we have a 1-2 month waitlist right now that we are working through. ?

## 2021-07-10 NOTE — Progress Notes (Signed)
Paramedicine Encounter ? ? ? Patient ID: Jason Stevenson, male    DOB: [birth year], 36 y.o.   MRN: [id number] ? ? ? ?Arrived for home visit for Jason Stevenson who reports feeling good with no complaints today reporting he had some mild dizziness with right sided chest pain last week but states this could be because he did not take his medications at scheduled time. I reminded him of the importance of continuing to take his medications at scheduled times. He agreed.  ? ?Vitals and assessment obtained. No lower leg swelling. Lungs clear. No abdominal distention.  ? ?Meds reviewed and confirmed. Pill box filled accordingly for two weeks.  ? ?I called and checked on cardiac rehab referral and they report he has to be seen by HF clinic prior to being scheduled and that they have a 2-3 month wait list. I called and obtained appointment for 4/12 at 12:00 for HF clinic follow up.  ? ?Jason Stevenson was made aware of all upcoming appointments and knows to call Medicaid transport to schedule his rides.  ? ? ?Home visit complete. He is aware to reach out if he needs anything.  ? ?Refills: ?[name] ?Digoxin ?Cymbalta ?Keppra ?Trileptal ? ? ?Patient Care Team: ?Ladell Pier, MD as PCP - General (Internal Medicine) ?Josue Hector, MD as PCP - Cardiology (Cardiology) ?Vickie Epley, MD as PCP - Electrophysiology (Cardiology) ? ?Patient Active Problem List  ? Diagnosis Date Noted  ? LADA (latent autoimmune diabetes in adults), managed as type 2 ([place]) 03/28/2021  ? Poorly controlled type 2 diabetes mellitus with circulatory disorder ([place]) 03/19/2021  ? Ischemic cardiomyopathy   ? Syncope 12/14/2020  ? Chest pain 10/30/2020  ? History of cerebrovascular accident (CVA) with residual deficit 10/27/2020  ? Protein-calorie malnutrition, severe 09/22/2020  ? Acute CVA (cerebrovascular accident) ([place]) 09/21/2020  ? Near syncope 09/21/2020  ? Encounter for monitoring Coumadin therapy 09/20/2020  ? LV (left ventricular) mural thrombus 09/13/2020   ? Seizure ([place]) 09/13/2020  ? Neurological deficit present 09/12/2020  ? History of COVID-19 09/12/2020  ? Intracerebral hemorrhage 09/12/2020  ? Acute cerebrovascular accident (CVA) ([place]) 09/12/2020  ? COVID-19 08/23/2020  ? Noncompliance with medication treatment due to intermittent use of medication 10/12/2018  ? Dyslipidemia 04/17/2016  ? Chronic systolic CHF (congestive heart failure) ([place]) 06/18/2015  ? Needs flu shot 12/23/2013  ? Non-ischemic cardiomyopathy ([place]) 11/06/2012  ? HTN (hypertension) 11/06/2012  ? HLD (hyperlipidemia) 11/06/2012  ? ? ?Current Outpatient Medications:  ?  Accu-Chek Softclix Lancets lancets, Use to check blood sugar three times daily., Disp: 100 each, Rfl: 3 ?  atorvastatin (LIPITOR) 80 MG tablet, TAKE 1 TABLET (80 MG TOTAL) BY MOUTH DAILY., Disp: 30 tablet, Rfl: 2 ?  Blood Glucose Monitoring Suppl (ACCU-CHEK GUIDE) w/Device KIT, Use to check blood sugar three times daily., Disp: 1 kit, Rfl: 0 ?  Blood Pressure Monitor DEVI, Use as directed to check home blood pressure 2-3 times a week, Disp: 1 Device, Rfl: 0 ?  Continuous Blood Gluc Receiver (FREESTYLE LIBRE 2 READER) DEVI, 1 each by Does not apply route daily., Disp: 1 each, Rfl: 0 ?  Continuous Blood Gluc Sensor (FREESTYLE LIBRE 2 SENSOR) MISC, 1 each by Does not apply route every 14 (fourteen) days., Disp: 6 each, Rfl: 3 ?  dapagliflozin propanediol (FARXIGA) 5 MG TABS tablet, Take 1 tablet (5 mg total) by mouth daily before breakfast., Disp: 30 tablet, Rfl: 3 ?  digoxin (LANOXIN) 0.125 MG tablet, TAKE 1 TABLET (0.125 MG TOTAL) BY  MOUTH DAILY., Disp: 30 tablet, Rfl: 2 ?  DULoxetine (CYMBALTA) 30 MG capsule, TAKE 1 CAPSULE (30 MG TOTAL) BY MOUTH DAILY., Disp: 30 capsule, Rfl: 2 ?  Evolocumab (REPATHA SURECLICK) 601 MG/ML SOAJ, Inject 1 pen into the skin every 14 (fourteen) days., Disp: 2 mL, Rfl: 11 ?  ezetimibe (ZETIA) 10 MG tablet, TAKE 1 TABLET (10 MG TOTAL) BY MOUTH DAILY., Disp: 30 tablet, Rfl: 2 ?  furosemide (LASIX) 20 MG  tablet, Take 1 tablet (20 mg total) by mouth daily., Disp: 30 tablet, Rfl: 3 ?  glucose blood (ACCU-CHEK GUIDE) test strip, Use to check blood sugar three times daily., Disp: 100 each, Rfl: 2 ?  insulin aspart (FIASP FLEXTOUCH) 100 UNIT/ML FlexTouch Pen, Inject 5-8 Units into the skin 3 (three) times daily before meals., Disp: 30 mL, Rfl: 3 ?  insulin degludec (TRESIBA FLEXTOUCH) 200 UNIT/ML FlexTouch Pen, Inject 20 Units into the skin daily., Disp: 30 mL, Rfl: 3 ?  Insulin Pen Needle 32G X 4 MM MISC, Use to inject insulin., Disp: 200 each, Rfl: 2 ?  Insulin Syringe-Needle U-100 (RELION INSULIN SYRINGE) 31G X 15/64" 0.3 ML MISC, Use to inject insulin daily., Disp: 100 each, Rfl: 11 ?  ivabradine (CORLANOR) 7.5 MG TABS tablet, Take 1 tablet (7.5 mg total) by mouth 2 (two) times daily with a meal., Disp: 60 tablet, Rfl:  ?  levETIRAcetam (KEPPRA) 750 MG tablet, Take 2 tablets (1,500 mg total) by mouth 2 (two) times daily., Disp: 360 tablet, Rfl: 4 ?  Menthol, Topical Analgesic, (ICY HOT BACK EX), Apply 1 application topically as needed (neuropathy). Icy hot cream, Disp: , Rfl:  ?  metFORMIN (GLUCOPHAGE) 500 MG tablet, TAKE 1 TABLET (500 MG TOTAL) BY MOUTH 2 (TWO) TIMES DAILY WITH A MEAL., Disp: 60 tablet, Rfl: 2 ?  metoprolol (TOPROL-XL) 200 MG 24 hr tablet, TAKE 0.5 TABLETS (100 MG TOTAL) BY MOUTH DAILY. TAKE WITH OR IMMEDIATELY FOLLOWING A MEAL., Disp: 30 tablet, Rfl: 2 ?  Oxcarbazepine (TRILEPTAL) 300 MG tablet, Take 2 tablets (600 mg total) by mouth 2 (two) times daily., Disp: 360 tablet, Rfl: 4 ?  phenytoin (DILANTIN) 100 MG ER capsule, Take 1 capsule (100 mg total) by mouth 3 (three) times daily., Disp: 270 capsule, Rfl: 4 ?  pregabalin (LYRICA) 300 MG capsule, Take 1 capsule (300 mg total) by mouth at bedtime., Disp: 30 capsule, Rfl: 5 ?  sacubitril-valsartan (ENTRESTO) 24-26 MG, Take 1 tablet by mouth 2 (two) times daily., Disp: 60 tablet, Rfl: 0 ?  spironolactone (ALDACTONE) 25 MG tablet, Take 1 tablet (25 mg  total) by mouth at bedtime., Disp: 90 tablet, Rfl: 3 ?  warfarin (COUMADIN) 5 MG tablet, Take 1-1.5 tablets (5-7.5 mg total) by mouth daily as directed by Coumadin clinic, Disp: 40 tablet, Rfl: 3 ?No Known Allergies ? ? ?Social History  ? ?Socioeconomic History  ? Marital status: Married  ?  Spouse name: Felicity Coyer  ? Number of children: 0  ? Years of education: Not on file  ? Highest education level: Not on file  ?Occupational History  ? Occupation: unemployed  ?Tobacco Use  ? Smoking status: Never  ? Smokeless tobacco: Never  ?Vaping Use  ? Vaping Use: Not on file  ?Substance and Sexual Activity  ? Alcohol use: Not Currently  ?  Comment: "occasional" when "hanging out with the wrong people" No recent use.  ? Drug use: Not Currently  ?  Types: Marijuana  ?  Comment: occasional, last 2013  ? Sexual  activity: Not Currently  ?Other Topics Concern  ? Not on file  ?Social History Narrative  ? Pt lives at home with his wife   ? Right handed   ? Caffeine- hardly any  ? ?Social Determinants of Health  ? ?Financial Resource Strain: High Risk  ? Difficulty of Paying Living Expenses: Hard  ?Food Insecurity: No Food Insecurity  ? Worried About Charity fundraiser in the Last Year: Never true  ? Ran Out of Food in the Last Year: Never true  ?Transportation Needs: Unmet Transportation Needs  ? Lack of Transportation (Medical): No  ? Lack of Transportation (Non-Medical): Yes  ?Physical Activity: Not on file  ?Stress: Not on file  ?Social Connections: Not on file  ?Intimate Partner Violence: Not on file  ? ? ?Physical Exam ?Vitals reviewed.  ?Constitutional:   ?   Appearance: Normal appearance. He is normal weight.  ?HENT:  ?   Head: Normocephalic.  ?   Nose: Nose normal.  ?   Mouth/Throat:  ?   Mouth: Mucous membranes are moist.  ?   Pharynx: Oropharynx is clear.  ?Eyes:  ?   Conjunctiva/sclera: Conjunctivae normal.  ?   Pupils: Pupils are equal, round, and reactive to light.  ?Cardiovascular:  ?   Rate and Rhythm: Normal rate and  regular rhythm.  ?   Pulses: Normal pulses.  ?   Heart sounds: Normal heart sounds.  ?Pulmonary:  ?   Effort: Pulmonary effort is normal.  ?   Breath sounds: Normal breath sounds.  ?Abdominal:  ?   Ge

## 2021-07-10 NOTE — Telephone Encounter (Signed)
Called patient to see if he is interested in the Cardiac Rehab Program. Patient expressed interest. Explained scheduling process and went over insurance, patient verbalized understanding.  ?

## 2021-07-16 NOTE — Progress Notes (Signed)
Patient ID: Jason Stevenson, male   DOB: 07-01-1985, 36 y.o.   MRN: [id number] ? ?This visit occurred during the SARS-CoV-2 public health emergency.  Safety protocols were in place, including screening questions prior to the visit, additional usage of staff PPE, and extensive cleaning of exam room while observing appropriate contact time as indicated for disinfecting solutions.  ? ?HPI: ?Jason Stevenson is a 36 y.o.-year-old male, initially referred by his cardiologist, Dr. Aundra Dubin, returning for follow-up for DM, dx in 2011, but as LADA in 2022, insulin-dependent, uncontrolled, with complications (ischemic cardiomyopathy complicated by systolic CHF, history of CVA -after Covid19 infection, peripheral neuropathy).  Last visit 3 months ago. ? ?Interim history: ?No increased urination, nausea, chest pain.  He does have blurry vision at distance, which is not much changed from before. ?He is evaluated at [facility] for heart transplant.  He needs to get stronger first so he will join their physical therapy program. ? ?Reviewed HbA1c: ?Lab Results  ?Component Value Date  ? HGBA1C 7.7 (H) 04/19/2021  ? HGBA1C 8.2 (H) 01/29/2021  ? HGBA1C 8.3 (H) 12/14/2020  ? HGBA1C >15.5 (H) 09/12/2020  ? HGBA1C >15.5 (H) 11/26/2019  ? HGBA1C 14.2 (A) 04/26/2019  ? HGBA1C 13.8 (A) 01/18/2019  ? HGBA1C 13.7 (A) 10/12/2018  ? HGBA1C 12.4 (H) 07/02/2018  ? HGBA1C 11.6 08/12/2017  ? ?At last visit he was on: ?- Metformin 500 mg 2x a day, with meals ?- Farxiga 5 mg before b'fast ?- Novolin 70/30 30 min before a meal ?22-25 units before breakfast ?14-16 units before dinner  ? ?We changed to: ?- Metformin 500 mg 2x a day, with meals >> diarrhea ?- Farxiga 5 mg before b'fast ? - Tresiba 20 units daily ? - FiAsp 5-8 units before each meal ? ?Pt checks his sugars >4x a day with his Freestyle Libre CGM: ? ? ? ?Prev.: ?- am: 68-192, 212 (no insulin) >>  102-194, 216 (missed insulin) ?- 2h after b'fast: 302 in the office >> 152, 281 ?- before lunch:  95-141 >> 174 ?- 2h after lunch: 241 >> 73, 162 ?- before dinner: 57, 331 >> 57, 125-174 ?- 2h after dinner: n/c >> 145-166 ?- bedtime: 187-312 >> 115-153, 240 ?- nighttime: n/c >> 241 ?Lowest sugar was 51 >> 57 >> 60; he has hypoglycemia awareness at 70.  ?Highest sugar was 321 >> 281 >> 200s. ? ?Glucometer:TrueMetrix ? ?Pt's meals are: ?- Breakfast: fruit (including banana), PB, toast, pancakes ?- Lunch: pasta, stir-fry with protein, sandwiches ?- Dinner: varies ?- Snacks: fruit, sometimes chips ? ?- no CKD, last BUN/creatinine:  ?Lab Results  ?Component Value Date  ? BUN 13 04/03/2021  ? BUN 14 03/13/2021  ? CREATININE 0.85 04/03/2021  ? CREATININE 0.84 03/13/2021  ?On Entresto 24/26. ? ?-+ HL; last set of lipids: ?Lab Results  ?Component Value Date  ? CHOL 107 06/08/2021  ? HDL 49 06/08/2021  ? [place] 31 06/08/2021  ? TRIG 166 (H) 06/08/2021  ? CHOLHDL 2.2 06/08/2021  ?On atorvastatin 80 mg daily, ezetimibe 10 mg daily. Also, Repatha. ? ?- last eye exam was in 2018. No DR. Now that he got insurance, he is planning to schedule this. ? ?- + numbness and tingling in his feet. He was currently referred to a pain clinic and also started on Lyrica, Cymbalta. ?He had ABIs on 06/22/2021: Normal ? ?Pt has FH of DM in mother and sister. ? ?He also has HTN. ? ?ROS: ?+ see HPI ? ?Past Medical History:  ?Diagnosis  Date  ? Chronic systolic CHF (congestive heart failure) ([place]) 06/18/2015  ? Diabetes mellitus without complication ([place])   ? Hemorrhagic stroke ([place])   ? Hyperlipidemia   ? Hypertension   ? Nonischemic cardiomyopathy ([place]) Noted as early as 2007  ? Per chart review (cards consult note 2011), EF of 40% in 2007, down to 20-25% in 2011  ? ?Past Surgical History:  ?Procedure Laterality Date  ? None    ? RIGHT HEART CATH  06/21/2021  ? [facility]  ? RIGHT/LEFT HEART CATH AND CORONARY ANGIOGRAPHY N/A 11/02/2020  ? Procedure: RIGHT/LEFT HEART CATH AND CORONARY ANGIOGRAPHY;  Surgeon: Larey Dresser, MD;  Location: [facility];  Service: Cardiovascular;  Laterality: N/A;  ? ?Social History  ? ?Socioeconomic History  ? Marital status: Married  ?  Spouse name: Felicity Coyer  ? Number of children: 0  ? Years of education: Not on file  ? Highest education level: Not on file  ?Occupational History  ? Occupation: unemployed  ?Tobacco Use  ? Smoking status: Never  ? Smokeless tobacco: Never  ?Vaping Use  ? Vaping Use: Not on file  ?Substance and Sexual Activity  ? Alcohol use: Not Currently  ?  Comment: "occasional" when "hanging out with the wrong people" No recent use.  ? Drug use: Not Currently  ?  Types: Marijuana  ?  Comment: occasional, last 2013  ? Sexual activity: Not Currently  ?Other Topics Concern  ? Not on file  ?Social History Narrative  ? Pt lives at home with his wife   ? Right handed   ? Caffeine- hardly any  ? ?Social Determinants of Health  ? ?Financial Resource Strain: High Risk  ? Difficulty of Paying Living Expenses: Hard  ?Food Insecurity: No Food Insecurity  ? Worried About Charity fundraiser in the Last Year: Never true  ? Ran Out of Food in the Last Year: Never true  ?Transportation Needs: Unmet Transportation Needs  ? Lack of Transportation (Medical): No  ? Lack of Transportation (Non-Medical): Yes  ?Physical Activity: Not on file  ?Stress: Not on file  ?Social Connections: Not on file  ?Intimate Partner Violence: Not on file  ? ?Current Outpatient Medications on File Prior to Visit  ?Medication Sig Dispense Refill  ? Accu-Chek Softclix Lancets lancets Use to check blood sugar three times daily. 100 each 3  ? atorvastatin (LIPITOR) 80 MG tablet TAKE 1 TABLET (80 MG TOTAL) BY MOUTH DAILY. 30 tablet 2  ? Blood Glucose Monitoring Suppl (ACCU-CHEK GUIDE) w/Device KIT Use to check blood sugar three times daily. 1 kit 0  ? Blood Pressure Monitor DEVI Use as directed to check home blood pressure 2-3 times a week 1 Device 0  ? Continuous Blood Gluc Receiver (FREESTYLE LIBRE 2 READER) DEVI 1 each by Does not apply  route daily. 1 each 0  ? Continuous Blood Gluc Sensor (FREESTYLE LIBRE 2 SENSOR) MISC 1 each by Does not apply route every 14 (fourteen) days. 6 each 3  ? dapagliflozin propanediol (FARXIGA) 5 MG TABS tablet Take 1 tablet (5 mg total) by mouth daily before breakfast. 30 tablet 3  ? digoxin (LANOXIN) 0.125 MG tablet TAKE 1 TABLET (0.125 MG TOTAL) BY MOUTH DAILY. 30 tablet 2  ? DULoxetine (CYMBALTA) 30 MG capsule TAKE 1 CAPSULE (30 MG TOTAL) BY MOUTH DAILY. 30 capsule 2  ? Evolocumab (REPATHA SURECLICK) 462 MG/ML SOAJ Inject 1 pen into the skin every 14 (fourteen) days. 2 mL 11  ?  ezetimibe (ZETIA) 10 MG tablet TAKE 1 TABLET (10 MG TOTAL) BY MOUTH DAILY. 30 tablet 2  ? furosemide (LASIX) 20 MG tablet Take 1 tablet (20 mg total) by mouth daily. 30 tablet 3  ? glucose blood (ACCU-CHEK GUIDE) test strip Use to check blood sugar three times daily. 100 each 2  ? insulin aspart (FIASP FLEXTOUCH) 100 UNIT/ML FlexTouch Pen Inject 5-8 Units into the skin 3 (three) times daily before meals. 30 mL 3  ? insulin degludec (TRESIBA FLEXTOUCH) 200 UNIT/ML FlexTouch Pen Inject 20 Units into the skin daily. 30 mL 3  ? Insulin Pen Needle 32G X 4 MM MISC Use to inject insulin. 200 each 2  ? Insulin Syringe-Needle U-100 (RELION INSULIN SYRINGE) 31G X 15/64" 0.3 ML MISC Use to inject insulin daily. 100 each 11  ? ivabradine (CORLANOR) 7.5 MG TABS tablet Take 1 tablet (7.5 mg total) by mouth 2 (two) times daily with a meal. 60 tablet   ? levETIRAcetam (KEPPRA) 750 MG tablet Take 2 tablets (1,500 mg total) by mouth 2 (two) times daily. 360 tablet 4  ? Menthol, Topical Analgesic, (ICY HOT BACK EX) Apply 1 application topically as needed (neuropathy). Icy hot cream    ? metFORMIN (GLUCOPHAGE) 500 MG tablet TAKE 1 TABLET (500 MG TOTAL) BY MOUTH 2 (TWO) TIMES DAILY WITH A MEAL. 60 tablet 2  ? metoprolol (TOPROL-XL) 200 MG 24 hr tablet TAKE 0.5 TABLETS (100 MG TOTAL) BY MOUTH DAILY. TAKE WITH OR IMMEDIATELY FOLLOWING A MEAL. 30 tablet 2  ?  Oxcarbazepine (TRILEPTAL) 300 MG tablet Take 2 tablets (600 mg total) by mouth 2 (two) times daily. 360 tablet 4  ? phenytoin (DILANTIN) 100 MG ER capsule Take 1 capsule (100 mg total) by mouth 3 (three) times daily

## 2021-07-17 ENCOUNTER — Ambulatory Visit (INDEPENDENT_AMBULATORY_CARE_PROVIDER_SITE_OTHER): Payer: Medicaid Other | Admitting: Internal Medicine

## 2021-07-17 ENCOUNTER — Encounter: Payer: Self-pay | Admitting: Internal Medicine

## 2021-07-17 VITALS — BP 128/84 | HR 79 | Ht 71.0 in | Wt 160.8 lb

## 2021-07-17 DIAGNOSIS — E1169 Type 2 diabetes mellitus with other specified complication: Secondary | ICD-10-CM

## 2021-07-17 DIAGNOSIS — E1369 Other specified diabetes mellitus with other specified complication: Secondary | ICD-10-CM

## 2021-07-17 DIAGNOSIS — E785 Hyperlipidemia, unspecified: Secondary | ICD-10-CM | POA: Diagnosis not present

## 2021-07-17 DIAGNOSIS — E139 Other specified diabetes mellitus without complications: Secondary | ICD-10-CM

## 2021-07-17 MED ORDER — METFORMIN HCL ER 500 MG PO TB24: 500.0000 mg | ORAL_TABLET | Freq: Two times a day (BID) | ORAL | 3 refills | Status: DC

## 2021-07-17 NOTE — Patient Instructions (Signed)
Please continue: ?- Metformin (change to ER) 500 mg 2x a day, with meals ?- Farxiga 5 mg before b'fast ? - Tresiba 20 units daily ? - FiAsp 5-8 units before each meal (use closer to 8 units before meals containing more carbs)  ? ?Please return in 4 months. ?

## 2021-07-20 ENCOUNTER — Ambulatory Visit (INDEPENDENT_AMBULATORY_CARE_PROVIDER_SITE_OTHER): Payer: Medicaid Other

## 2021-07-20 DIAGNOSIS — Z7901 Long term (current) use of anticoagulants: Secondary | ICD-10-CM | POA: Diagnosis not present

## 2021-07-20 DIAGNOSIS — Z5181 Encounter for therapeutic drug level monitoring: Secondary | ICD-10-CM

## 2021-07-20 DIAGNOSIS — I513 Intracardiac thrombosis, not elsewhere classified: Secondary | ICD-10-CM | POA: Diagnosis not present

## 2021-07-20 DIAGNOSIS — I639 Cerebral infarction, unspecified: Secondary | ICD-10-CM | POA: Diagnosis not present

## 2021-07-20 LAB — POCT INR: INR: 1.9 — AB (ref 2.0–3.0)

## 2021-07-20 NOTE — Patient Instructions (Signed)
-   Take 1.5 tablet today, then ?- Continue to take warfarin 1.5 tablets daily except for 1 tablet on Mondays, Wednesday and Fridays.  ?- Recheck INR in 2 weeks.  ?Coumadin Clinic [phone] ?

## 2021-07-23 NOTE — Progress Notes (Signed)
PCP: Dr. Karle Plumber ?Primary Cardiologist: Dr. Aundra Dubin ? ?HPI: ?36 y.o.male with diabetes, HTN, and a long history of nonischemic cardiomyopathy. He had a cardiac MRI in 1/08 showing low EF, but he says that he had been told about "heart problems" even prior to that.  He does not have a family history of cardiomyopathy that he knows of, but does not know his father's family.  Coronary CT angiogram in 2014 showed no CAD. Echo in 2/17 showed EF 20-25%, similar to 2014. Had repeat echo in 2020 showing LVEF 15-20%, RV normal. He had COVID-19 in 4/22.  ? ?He presented to [facility] ED 09/11/20 with tingling in left arm, left face, drooling and blank stare for about 1 week, and admitted for possible seizure, HHS and possible CVA.  In ED, he had left-sided neurologic deficit with dysarthria.  CT head with probable subacute infarct in right frontal lobe and old right parietal infarct.  Reportedly, patient's neurologic deficits resolved in ED, and focus shifted to possible seizure versus stroke.  He was loaded with Keppra.  MRI brain with probable hemorrhagic infarct involving the anterior right frontal lobe and left parietal occipital cortex with additional chronic right parietal infarct, right frontotemporal region enhancement suggesting changes related to acute seizure, possible toxic insult involving the caudate and lentiform nuclei bilaterally.  Neurology recommended interval follow-up brain MRI.  CTA head and neck without significant finding.  TTE with EF of 10-15%, LV thrombus, moderate RV dysfunction and dilated IVC.  He was discharged on GDMT, but A1c too high for SGLT2i.   ? ?Seen in [facility] ED 09/21/20 for fall after syncopal episode on toilet, hitting his head. CT of the head was significant for no bleeding but new low-density left cerebellum suggesting acute infarct. EEG unremarkable and felt fall secondary to vagal episode not seizure. ? ?He had atypical chest pain at his hospital follow up. With his history of HLD and DM,  decisions was made to undergo [facility] to evaluate coronary anatomy and hemodynamics.  R/LHC (7/22) showed normal filling pressures, preserved cardiac output and no significant CAD. RA mean 1, RV 28/1, PA 19/7 mean 12, PCWP 3, CO/CI 5.54/2.92. ? ?Again admitted 12/14/20-12/16/20 for syncopal episode. CT head negative. He was given IVF, beta blocker/spiro/Entresto held initially. Suspected orthostatic vs micturition syncope. EP consulted with known low EF and plans for ICD implant when a1c better controlled. ? ?CPX in 9/22 showed severe HF limitation. Echo was done today and reviewed, EF < 20% with mild LV dilation, no LV thrombus noted, normal RV size with mildly decreased systolic function, IVC normal.  ? ?Follow up 11/22, weight up about 10 lbs.  Patient had a seizure thought to be related to hypoglycemia (paramedics called, improved with glucose). Lasix 20 mg started every other day, eventually increased to daily.  ? ?[place] 3/23 at [facility] as part of pre-transplant work up showed normal pressures, RA 7, RV 23/4, PA 14, PCWP 11, CI 3.5. ? ?Today he returns for HF follow up with his wife and paramedicine. Overall feeling fine.He has dyspnea with exertion or stairs, but breathing is OK walking on flat ground. Occasional dizziness but no falls/syncope. Denies abnormal bleeding, CP, palpitations, edema, or PND/Orthopnea. Appetite ok. No fever or chills. Weight at home 160 pounds. Taking all medications. He has been seen by [facility]. ? ?Labs (9/22): K 3.5, creatinine 0.85, digoxin 0.4 ?Labs (10/22): K 4.1, creatinine 0.8, digoxin 0.9 ?Labs (3/23): K 4.5, creatinine 0.9 ? ?ECG (personally reviewed): none ordered today. ? ?  ROS: All systems negative except as listed in HPI, PMH and Problem List. ? ?PMH:  ?1. Type 1 diabetes: Generally poorly controlled.  ?2. 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  ?3. H/o LV thrombus ?4. Syncope: Vagal vs orthostatic, likely not arrhythmic.  ?5.  Diabetic polyneuropathy.  ?6. Chronic systolic CHF: Nonischemic cardiomyopathy.  Long-standing.  ?- Cardiac MRI (1/08): EF 45%, no late gadolinium enhancement.  ?- Coronary CTA (7/14): No significant CAD ?- Echo (7/14): EF 20-25%, severe LV dilation.  ?- Echo (6/22): EF 10-15%, LV thrombus, mild LV dilation, moderately decreased RV systolic function with normal size, mild MR, dilated IVC.  ?- LHC/RHC (7/22): No CAD; mean RA 1, PA 19/7, mean PCWP 3, CI 2.92.  ?- Echo (10/22): EF < 20%, mild LV dilation, normal RV size with mildly decreased systolic function, IVC normal.  ?- CPX (9/22): peak VO2 10.4, VE/VCO2 slope 26, RER 1.22 => severe HF limitation.  ?- RHC (3/23) at [facility] RA 7, RV 23/4, PA 14, PCWP 11, CI 3.5 ?7. 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  ? ?SH:  ?Social History  ? ?Socioeconomic History  ? Marital status: Married  ?  Spouse name: Felicity Coyer  ? Number of children: 0  ? Years of education: Not on file  ? Highest education level: Not on file  ?Occupational History  ? Occupation: unemployed  ?Tobacco Use  ? Smoking status: Never  ? Smokeless tobacco: Never  ?Vaping Use  ? Vaping Use: Not on file  ?Substance and Sexual Activity  ? Alcohol use: Not Currently  ?  Comment: "occasional" when "hanging out with the wrong people" No recent use.  ? Drug use: Not Currently  ?  Types: Marijuana  ?  Comment: occasional, last 2013  ? Sexual activity: Not Currently  ?Other Topics Concern  ? Not on file  ?Social History Narrative  ? Pt lives at home with his wife   ? Right handed   ? Caffeine- hardly any  ? ?Social Determinants of Health  ? ?Financial Resource Strain: High Risk  ? Difficulty of Paying Living Expenses: Hard  ?Food Insecurity: No Food Insecurity  ? Worried About Charity fundraiser in the Last Year: Never true  ? Ran Out of Food in the Last Year: Never true  ?Transportation Needs: Unmet Transportation Needs  ? Lack of Transportation (Medical): No  ? Lack of Transportation (Non-Medical): Yes  ?Physical  Activity: Not on file  ?Stress: Not on file  ?Social Connections: Not on file  ?Intimate Partner Violence: Not on file  ? ?FH:  ?Family History  ?Problem Relation Age of Onset  ? Stroke Mother   ? Diabetes Mother   ? Hypertension Mother   ? Stroke Maternal Aunt   ? Heart attack Neg Hx   ? ? ?Current Outpatient Medications  ?Medication Sig Dispense Refill  ? Accu-Chek Softclix Lancets lancets Use to check blood sugar three times daily. 100 each 3  ? atorvastatin (LIPITOR) 80 MG tablet TAKE 1 TABLET (80 MG TOTAL) BY MOUTH DAILY. 30 tablet 2  ? Blood Glucose Monitoring Suppl (ACCU-CHEK GUIDE) w/Device KIT Use to check blood sugar three times daily. 1 kit 0  ? Blood Pressure Monitor DEVI Use as directed to check home blood pressure 2-3 times a week 1 Device 0  ? Continuous Blood Gluc Receiver (FREESTYLE LIBRE 2 READER) DEVI 1 each by Does not apply route daily. 1 each 0  ? dapagliflozin propanediol (FARXIGA) 5  MG TABS tablet Take 1 tablet (5 mg total) by mouth daily before breakfast. 30 tablet 3  ? digoxin (LANOXIN) 0.125 MG tablet TAKE 1 TABLET (0.125 MG TOTAL) BY MOUTH DAILY. 30 tablet 2  ? DULoxetine (CYMBALTA) 30 MG capsule TAKE 1 CAPSULE (30 MG TOTAL) BY MOUTH DAILY. 30 capsule 2  ? Evolocumab (REPATHA SURECLICK) 154 MG/ML SOAJ Inject 1 pen into the skin every 14 (fourteen) days. 2 mL 11  ? ezetimibe (ZETIA) 10 MG tablet TAKE 1 TABLET (10 MG TOTAL) BY MOUTH DAILY. 30 tablet 2  ? furosemide (LASIX) 20 MG tablet Take 1 tablet (20 mg total) by mouth daily. 30 tablet 3  ? glucose blood (ACCU-CHEK GUIDE) test strip Use to check blood sugar three times daily. 100 each 2  ? insulin aspart (FIASP FLEXTOUCH) 100 UNIT/ML FlexTouch Pen Inject 5-8 Units into the skin 3 (three) times daily before meals. 30 mL 3  ? insulin degludec (TRESIBA FLEXTOUCH) 200 UNIT/ML FlexTouch Pen Inject 20 Units into the skin daily. 30 mL 3  ? Insulin Pen Needle 32G X 4 MM MISC Use to inject insulin. 200 each 2  ? Insulin Syringe-Needle U-100  (RELION INSULIN SYRINGE) 31G X 15/64" 0.3 ML MISC Use to inject insulin daily. 100 each 11  ? ivabradine (CORLANOR) 7.5 MG TABS tablet Take 1 tablet (7.5 mg total) by mouth 2 (two) times daily with a meal. 60 tabl

## 2021-07-24 ENCOUNTER — Telehealth (HOSPITAL_COMMUNITY): Payer: Self-pay

## 2021-07-24 NOTE — Telephone Encounter (Signed)
Called and left patient a detailed message to confirm/remind patient of their appointment at the [facility] on 07/25/21.  ? ? ?

## 2021-07-25 ENCOUNTER — Ambulatory Visit (HOSPITAL_COMMUNITY)
Admission: RE | Admit: 2021-07-25 | Discharge: 2021-07-25 | Disposition: A | Discharge status: Home / Self Care | Payer: Medicaid Other | Source: Ambulatory Visit | Attending: Internal Medicine | Admitting: Internal Medicine

## 2021-07-25 ENCOUNTER — Other Ambulatory Visit: Payer: Self-pay | Admitting: Internal Medicine

## 2021-07-25 ENCOUNTER — Encounter (HOSPITAL_COMMUNITY): Payer: Self-pay

## 2021-07-25 ENCOUNTER — Other Ambulatory Visit (HOSPITAL_COMMUNITY): Payer: Self-pay

## 2021-07-25 VITALS — BP 92/60 | HR 61 | Wt 160.0 lb

## 2021-07-25 DIAGNOSIS — I110 Hypertensive heart disease with heart failure: Secondary | ICD-10-CM | POA: Diagnosis not present

## 2021-07-25 DIAGNOSIS — R569 Unspecified convulsions: Secondary | ICD-10-CM

## 2021-07-25 DIAGNOSIS — R55 Syncope and collapse: Secondary | ICD-10-CM | POA: Diagnosis not present

## 2021-07-25 DIAGNOSIS — Z794 Long term (current) use of insulin: Secondary | ICD-10-CM | POA: Insufficient documentation

## 2021-07-25 DIAGNOSIS — Z7984 Long term (current) use of oral hypoglycemic drugs: Secondary | ICD-10-CM | POA: Diagnosis not present

## 2021-07-25 DIAGNOSIS — I639 Cerebral infarction, unspecified: Secondary | ICD-10-CM

## 2021-07-25 DIAGNOSIS — Z79899 Other long term (current) drug therapy: Secondary | ICD-10-CM | POA: Insufficient documentation

## 2021-07-25 DIAGNOSIS — I5022 Chronic systolic (congestive) heart failure: Secondary | ICD-10-CM | POA: Diagnosis not present

## 2021-07-25 DIAGNOSIS — Z87898 Personal history of other specified conditions: Secondary | ICD-10-CM

## 2021-07-25 DIAGNOSIS — E109 Type 1 diabetes mellitus without complications: Secondary | ICD-10-CM | POA: Insufficient documentation

## 2021-07-25 DIAGNOSIS — E1165 Type 2 diabetes mellitus with hyperglycemia: Secondary | ICD-10-CM | POA: Diagnosis not present

## 2021-07-25 DIAGNOSIS — I513 Intracardiac thrombosis, not elsewhere classified: Secondary | ICD-10-CM

## 2021-07-25 DIAGNOSIS — Z8673 Personal history of transient ischemic attack (TIA), and cerebral infarction without residual deficits: Secondary | ICD-10-CM | POA: Diagnosis not present

## 2021-07-25 DIAGNOSIS — Z7901 Long term (current) use of anticoagulants: Secondary | ICD-10-CM | POA: Diagnosis not present

## 2021-07-25 DIAGNOSIS — I428 Other cardiomyopathies: Secondary | ICD-10-CM | POA: Insufficient documentation

## 2021-07-25 LAB — BASIC METABOLIC PANEL
Anion gap: 6 (ref 5–15)
BUN: 17 mg/dL (ref 6–20)
CO2: 30 mmol/L (ref 22–32)
Calcium: 9.4 mg/dL (ref 8.9–10.3)
Chloride: 104 mmol/L (ref 98–111)
Creatinine, Ser: 0.87 mg/dL (ref 0.61–1.24)
GFR, Estimated: >60 mL/min (ref >60)
Glucose, Bld: 108 mg/dL — ABNORMAL HIGH (ref 70–99)
Potassium: 4.5 mmol/L (ref 3.5–5.1)
Sodium: 140 mmol/L (ref 135–145)

## 2021-07-25 LAB — DIGOXIN LEVEL: Digoxin Level: 0.8 ng/mL (ref 0.8–2.0)

## 2021-07-25 MED ORDER — IVABRADINE HCL 5 MG PO TABS
5.0000 mg | ORAL_TABLET | Freq: Two times a day (BID) | ORAL | 6 refills | Status: DC
  Filled 2021-07-25: qty 60, 30d supply, fill #0

## 2021-07-25 MED ORDER — IVABRADINE HCL 5 MG PO TABS: 5.0000 mg | ORAL_TABLET | Freq: Two times a day (BID) | ORAL | 6 refills | Status: DC

## 2021-07-25 NOTE — Patient Instructions (Signed)
Thank you for coming in today ? ?Labs were done today, if any labs are abnormal the clinic will call you ?No news is good news  ? ?DECREASE Corlanor to 5 mg 1 tablet twice daily ? ?Your physician recommends that you schedule a follow-up appointment in:  ?Keep follow up appointment with Dr. Shirlee Latch ? ?At the [facility], you and your health needs are our priority. As part of our continuing mission to provide you with exceptional heart care, we have created designated Provider Care Teams. These Care Teams include your primary Cardiologist (physician) and Advanced Practice Providers (APPs- Physician Assistants and Nurse Practitioners) who all work together to provide you with the care you need, when you need it.  ? ?You may see any of the following providers on your designated Care Team at your next follow up: ?Dr Arvilla Meres ?Dr Marca Ancona ?Tonye Becket, NP ?Robbie Lis, PA ?Jessica Milford,NP ?Anna Genre, PA ?Karle Plumber, PharmD ? ? ?Please be sure to bring in all your medications bottles to every appointment.  ? ?If you have any questions or concerns before your next appointment please send [place] a message through [place] or call our office at [phone].   ? ?TO LEAVE A MESSAGE FOR THE NURSE SELECT OPTION 2, PLEASE LEAVE A MESSAGE INCLUDING: ?YOUR NAME ?DATE OF BIRTH ?CALL BACK NUMBER ?REASON FOR CALL**this is important as we prioritize the call backs ? ?YOU WILL RECEIVE A CALL BACK THE SAME DAY AS LONG AS YOU CALL BEFORE 4:00 PM ? ?

## 2021-07-25 NOTE — Progress Notes (Signed)
Medication Samples have been provided to the patient. ? ?Drug name: Corlanor       Strength: 5 mg        Qty: 2  LOT: [id number]  Exp.Date: 11/2024 ? ?Dosing instructions: Take  1 tablet Twice daily ? ? ?The patient has been instructed regarding the correct time, dose, and frequency of taking this medication, including desired effects and most common side effects.  ? ?Smitty Cords Farah Lepak ?12:33 PM ?07/25/2021 ? ?

## 2021-07-25 NOTE — Progress Notes (Signed)
Paramedicine Encounter ? ? ? Patient ID: Jason Stevenson, male    DOB: April 06, 1986, 36 y.o.   MRN: [id number] ? ? ? ?Met with [name] in clinic today where he was seen by NP [facility].  ? ?No complaints today. He is being referred to cardiac rehab as [facility] suggested. [name] is sending in referral today.  ? ?Corlanor decreased to 60m BID.  ? ?Labs today.  ? ?I reviewed meds and filled pill box for one week.  ? ?Refills: ?Dilantin ?Keppra ?[name]?Digoxin ?Zetia ?Cymbalta ?FWilder Glade ?Entresto  ? ?ACTION: ?Home visit completed ? ? ? ? ? ? ?

## 2021-08-02 ENCOUNTER — Telehealth (HOSPITAL_COMMUNITY): Payer: Medicaid Other

## 2021-08-02 ENCOUNTER — Other Ambulatory Visit (HOSPITAL_COMMUNITY): Payer: Self-pay

## 2021-08-02 NOTE — Progress Notes (Signed)
?Electrophysiology Office Follow up Visit Note:   ? ?Date:  08/04/2021  ? ?ID:  Jason Stevenson, DOB 11-05-1985, MRN [id number] ? ?PCP:  Jason Pier, MD  ?[facility] Cardiologist:  Jason Rouge, MD  ?[facility] Electrophysiologist:  Jason Epley, MD  ? ? ?Interval History:   ? ?Jason Stevenson is a 36 y.o. male who presents for a follow up visit. They were last seen in clinic February 07, 2021.  That appointment was in follow-up for hospitalization in September 2022 for nonischemic cardiomyopathy.  I believe denies that he was indicated when I first met the patient but he did not have insurance and so this was deferred.  He has since been approved for Medicaid.  He is also been referred to [facility] for transplant evaluation.  [facility] is planning on listing him for transplant.  He is with working on getting enrolled in cardiac rehab to improve his strength.  He is referred back to me to discuss ICD implant. ? ?His wife is on the phone during today's visit.  Had a long chat today about the heart transplant process and expectations. ? ? ? ?  ? ?Past Medical History:  ?Diagnosis Date  ? Chronic systolic CHF (congestive heart failure) ([place]) 06/18/2015  ? Diabetes mellitus without complication ([place])   ? Hemorrhagic stroke ([place])   ? Hyperlipidemia   ? Hypertension   ? Nonischemic cardiomyopathy ([place]) Noted as early as 2007  ? Per chart review (cards consult note 2011), EF of 40% in 2007, down to 20-25% in 2011  ? ? ?Past Surgical History:  ?Procedure Laterality Date  ? None    ? RIGHT HEART CATH  06/21/2021  ? [facility]  ? RIGHT/LEFT HEART CATH AND CORONARY ANGIOGRAPHY N/A 11/02/2020  ? Procedure: RIGHT/LEFT HEART CATH AND CORONARY ANGIOGRAPHY;  Surgeon: Larey Dresser, MD;  Location: [place] [facility];  Service: Cardiovascular;  Laterality: N/A;  ? ? ?Current Medications: ?Current Meds  ?Medication Sig  ? Accu-Chek Softclix Lancets lancets Use to check blood sugar three times daily.  ? atorvastatin  (LIPITOR) 80 MG tablet TAKE 1 TABLET (80 MG TOTAL) BY MOUTH DAILY.  ? Blood Glucose Monitoring Suppl (ACCU-CHEK GUIDE) w/Device KIT Use to check blood sugar three times daily.  ? Blood Pressure Monitor DEVI Use as directed to check home blood pressure 2-3 times a week  ? Continuous Blood Gluc Receiver (FREESTYLE LIBRE 2 READER) DEVI 1 each by Does not apply route daily.  ? dapagliflozin propanediol (FARXIGA) 5 MG TABS tablet TAKE 1 TABLET (5 MG TOTAL) BY MOUTH DAILY BEFORE BREAKFAST.  ? digoxin (LANOXIN) 0.125 MG tablet TAKE 1 TABLET (0.125 MG TOTAL) BY MOUTH DAILY.  ? DULoxetine (CYMBALTA) 30 MG capsule TAKE 1 CAPSULE (30 MG TOTAL) BY MOUTH DAILY.  ? Evolocumab (REPATHA SURECLICK) 601 MG/ML SOAJ Inject 1 pen into the skin every 14 (fourteen) days.  ? ezetimibe (ZETIA) 10 MG tablet TAKE 1 TABLET (10 MG TOTAL) BY MOUTH DAILY.  ? furosemide (LASIX) 20 MG tablet Take 1 tablet (20 mg total) by mouth daily.  ? glucose blood (ACCU-CHEK GUIDE) test strip Use to check blood sugar three times daily.  ? insulin aspart (FIASP FLEXTOUCH) 100 UNIT/ML FlexTouch Pen Inject 5-8 Units into the skin 3 (three) times daily before meals.  ? insulin degludec (TRESIBA FLEXTOUCH) 200 UNIT/ML FlexTouch Pen Inject 20 Units into the skin daily.  ? Insulin Pen Needle 32G X 4 MM MISC Use to inject insulin.  ? Insulin Syringe-Needle  U-100 (RELION INSULIN SYRINGE) 31G X 15/64" 0.3 ML MISC Use to inject insulin daily.  ? ivabradine (CORLANOR) 5 MG TABS tablet Take 1 tablet (5 mg total) by mouth 2 (two) times daily with a meal.  ? levETIRAcetam (KEPPRA) 750 MG tablet Take 2 tablets (1,500 mg total) by mouth 2 (two) times daily.  ? Menthol, Topical Analgesic, (ICY HOT BACK EX) Apply 1 application topically as needed (neuropathy). Icy hot cream  ? metFORMIN (GLUCOPHAGE-XR) 500 MG 24 hr tablet Take 1 tablet (500 mg total) by mouth 2 (two) times daily with a meal.  ? metoprolol (TOPROL-XL) 200 MG 24 hr tablet TAKE 0.5 TABLETS (100 MG TOTAL) BY MOUTH  DAILY. TAKE WITH OR IMMEDIATELY FOLLOWING A MEAL.  ? Oxcarbazepine (TRILEPTAL) 300 MG tablet Take 2 tablets (600 mg total) by mouth 2 (two) times daily.  ? phenytoin (DILANTIN) 100 MG ER capsule Take 1 capsule (100 mg total) by mouth 3 (three) times daily.  ? pregabalin (LYRICA) 300 MG capsule Take 1 capsule (300 mg total) by mouth at bedtime.  ? sacubitril-valsartan (ENTRESTO) 24-26 MG Take 1 tablet by mouth 2 (two) times daily.  ? spironolactone (ALDACTONE) 25 MG tablet Take 1 tablet (25 mg total) by mouth at bedtime.  ? warfarin (COUMADIN) 5 MG tablet Take 1-1.5 tablets (5-7.5 mg total) by mouth daily as directed by Coumadin clinic  ?  ? ?Allergies:   Patient has no known allergies.  ? ?Social History  ? ?Socioeconomic History  ? Marital status: Married  ?  Spouse name: Felicity Coyer  ? Number of children: 0  ? Years of education: Not on file  ? Highest education level: Not on file  ?Occupational History  ? Occupation: unemployed  ?Tobacco Use  ? Smoking status: Never  ? Smokeless tobacco: Never  ?Vaping Use  ? Vaping Use: Not on file  ?Substance and Sexual Activity  ? Alcohol use: Not Currently  ?  Comment: "occasional" when "hanging out with the wrong people" No recent use.  ? Drug use: Not Currently  ?  Types: Marijuana  ?  Comment: occasional, last 2013  ? Sexual activity: Not Currently  ?Other Topics Concern  ? Not on file  ?Social History Narrative  ? Pt lives at home with his wife   ? Right handed   ? Caffeine- hardly any  ? ?Social Determinants of Health  ? ?Financial Resource Strain: High Risk  ? Difficulty of Paying Living Expenses: Hard  ?Food Insecurity: No Food Insecurity  ? Worried About Charity fundraiser in the Last Year: Never true  ? Ran Out of Food in the Last Year: Never true  ?Transportation Needs: Unmet Transportation Needs  ? Lack of Transportation (Medical): No  ? Lack of Transportation (Non-Medical): Yes  ?Physical Activity: Not on file  ?Stress: Not on file  ?Social Connections: Not on file  ?   ? ?Family History: ?The patient's family history includes Diabetes in his mother; Hypertension in his mother; Stroke in his maternal aunt and mother. There is no history of Heart attack. ? ?ROS:   ?Please see the history of present illness.    ?All other systems reviewed and are negative. ? ?EKGs/Labs/Other Studies Reviewed:   ? ?The following studies were reviewed today: ? ?[facility] records ? ? ? ?EKG:  The ekg ordered today demonstrates sinus rhythm.  QRS duration 118 ms. ? ?Recent Labs: ?09/19/2020: Magnesium 1.9 ?09/22/2020: ALT 42 ?12/14/2020: B Natriuretic Peptide 54.0; TSH 1.741 ?08/03/2021: BUN 12; Creatinine, Ser  0.84; Hemoglobin 13.7; Platelets 275; Potassium 4.6; Sodium 142  ?Recent Lipid Panel ?   ?Component Value Date/Time  ? CHOL 107 06/08/2021 0851  ? TRIG 166 (H) 06/08/2021 0851  ? HDL 49 06/08/2021 0851  ? CHOLHDL 2.2 06/08/2021 0851  ? CHOLHDL 3.1 12/04/2020 1018  ? VLDL 23 12/04/2020 1018  ? [place] 31 06/08/2021 0851  ? ? ?Physical Exam:   ? ?VS:  BP (!) 104/54   Pulse 68   Ht _0  (1.803 m)   Wt 160 lb 3.2 oz (72.7 kg)   SpO2 98%   BMI 22.34 kg/m?    ? ?Wt Readings from Last 3 Encounters:  ?08/03/21 160 lb 3.2 oz (72.7 kg)  ?08/02/21 161 lb (73 kg)  ?07/25/21 160 lb (72.6 kg)  ?  ? ?GEN:  Well nourished, well developed in no acute distress.  Thin ?HEENT: Normal ?NECK: No JVD; No carotid bruits ?LYMPHATICS: No lymphadenopathy ?CARDIAC: RRR, no murmurs, rubs, gallops ?RESPIRATORY:  Clear to auscultation without rales, wheezing or rhonchi  ?ABDOMEN: Soft, non-tender, non-distended ?MUSCULOSKELETAL:  No edema; No deformity  ?SKIN: Warm and dry ?NEUROLOGIC:  Alert and oriented x 3 ?PSYCHIATRIC:  Normal affect  ? ? ? ?  ? ?ASSESSMENT:   ? ?1. Chronic systolic CHF (congestive heart failure) ([place])   ?2. Non-ischemic cardiomyopathy ([place])   ?3. LV (left ventricular) mural thrombus   ? ?PLAN:   ? ?In order of problems listed above: ? ?#Chronic systolic heart failure ?#Non-ischemic cardiomyopathy ? ?NYHA  class III-IV.  Warm and dry on exam.  Being followed by [facility] cardiology.  Pending cardiac transplant listing.  Presents back to me today to discuss ICD given pending transplant listing.  I have discussed ICD impla

## 2021-08-02 NOTE — Telephone Encounter (Signed)
Jason Stevenson advised me Jason Stevenson is wanting him to receive his third dose of his HEP B Vaccine. Can we assist in scheduling this in clinic at [facility]?  ?Let me know how I can assist.  ?

## 2021-08-02 NOTE — Progress Notes (Signed)
? ?Paramedicine Encounter ? ? ? Patient ID: Jason Stevenson, male    DOB: [birth year], 36 y.o.   MRN: [id number] ? ?Arrived for home visit for Jason Stevenson who reports feeling okay but had an episode of feeling like he was going to have a seizure earlier. He reports his blood sugar was 84 and he took his insulin and began to feel light headed. He ate some cereal and rechecked his sugar and it was 100. Vitals were obtained and CBG was 134. BP- 102/58 ? ?He denied dizziness on assessment. Orthostatic vitals were obtained and there were no changes. He reports feeling "fine" during our visit. He has had no missed doses and has been compliant with all meds. I reviewed same filling pill box for one week.  ? ?He has not been contacted by Cardiac Rehab as of yet, they are awaiting a medicaid order form from [facility] MD. I made RN Jason Stevenson aware.  ? ?Jason Stevenson is reporting [facility] needs him to get the third dose of his Hep B Vaccine. I will reach out to Jason Stevenson to schedule same at [facility].  ? ?Appointments reviewed and confirmed.  ? ?Home visit complete. I advised Jason Stevenson to monitor vitals when he is feeling light headed. I provided him with a pulse oximeter to check his HR and O2 LEVELS at home. He has his automated BP machine and his Freestyle Libre to monitor his blood sugar.  ? ?Refills- NONE  ? ? ? ? ?Patient Care Team: ?Ladell Pier, MD as PCP - General (Internal Medicine) ?Josue Hector, MD as PCP - Cardiology (Cardiology) ?Vickie Epley, MD as PCP - Electrophysiology (Cardiology) ? ?Patient Active Problem List  ? Diagnosis Date Noted  ? LADA (latent autoimmune diabetes in adults), managed as type 2 ([place]) 03/28/2021  ? Poorly controlled type 2 diabetes mellitus with circulatory disorder ([place]) 03/19/2021  ? Ischemic cardiomyopathy   ? Syncope 12/14/2020  ? Chest pain 10/30/2020  ? History of cerebrovascular accident (CVA) with residual deficit 10/27/2020  ? Protein-calorie malnutrition, severe 09/22/2020  ? Acute CVA  (cerebrovascular accident) ([place]) 09/21/2020  ? Near syncope 09/21/2020  ? Encounter for monitoring Coumadin therapy 09/20/2020  ? LV (left ventricular) mural thrombus 09/13/2020  ? Seizure ([place]) 09/13/2020  ? Neurological deficit present 09/12/2020  ? History of COVID-19 09/12/2020  ? Intracerebral hemorrhage 09/12/2020  ? Acute cerebrovascular accident (CVA) ([place]) 09/12/2020  ? COVID-19 08/23/2020  ? Noncompliance with medication treatment due to intermittent use of medication 10/12/2018  ? Dyslipidemia 04/17/2016  ? Chronic systolic CHF (congestive heart failure) ([place]) 06/18/2015  ? Needs flu shot 12/23/2013  ? Non-ischemic cardiomyopathy ([place]) 11/06/2012  ? HTN (hypertension) 11/06/2012  ? HLD (hyperlipidemia) 11/06/2012  ? ? ?Current Outpatient Medications:  ?  dapagliflozin propanediol (FARXIGA) 5 MG TABS tablet, TAKE 1 TABLET (5 MG TOTAL) BY MOUTH DAILY BEFORE BREAKFAST., Disp: 30 tablet, Rfl: 0 ?  Accu-Chek Softclix Lancets lancets, Use to check blood sugar three times daily., Disp: 100 each, Rfl: 3 ?  atorvastatin (LIPITOR) 80 MG tablet, TAKE 1 TABLET (80 MG TOTAL) BY MOUTH DAILY., Disp: 30 tablet, Rfl: 2 ?  Blood Glucose Monitoring Suppl (ACCU-CHEK GUIDE) w/Device KIT, Use to check blood sugar three times daily., Disp: 1 kit, Rfl: 0 ?  Blood Pressure Monitor DEVI, Use as directed to check home blood pressure 2-3 times a week, Disp: 1 Device, Rfl: 0 ?  Continuous Blood Gluc Receiver (FREESTYLE LIBRE 2 READER) DEVI, 1 each by Does not apply route  daily., Disp: 1 each, Rfl: 0 ?  digoxin (LANOXIN) 0.125 MG tablet, TAKE 1 TABLET (0.125 MG TOTAL) BY MOUTH DAILY., Disp: 30 tablet, Rfl: 2 ?  DULoxetine (CYMBALTA) 30 MG capsule, TAKE 1 CAPSULE (30 MG TOTAL) BY MOUTH DAILY., Disp: 30 capsule, Rfl: 2 ?  Evolocumab (REPATHA SURECLICK) 767 MG/ML SOAJ, Inject 1 pen into the skin every 14 (fourteen) days., Disp: 2 mL, Rfl: 11 ?  ezetimibe (ZETIA) 10 MG tablet, TAKE 1 TABLET (10 MG TOTAL) BY MOUTH DAILY., Disp: 30 tablet,  Rfl: 2 ?  furosemide (LASIX) 20 MG tablet, Take 1 tablet (20 mg total) by mouth daily., Disp: 30 tablet, Rfl: 3 ?  glucose blood (ACCU-CHEK GUIDE) test strip, Use to check blood sugar three times daily., Disp: 100 each, Rfl: 2 ?  insulin aspart (FIASP FLEXTOUCH) 100 UNIT/ML FlexTouch Pen, Inject 5-8 Units into the skin 3 (three) times daily before meals., Disp: 30 mL, Rfl: 3 ?  insulin degludec (TRESIBA FLEXTOUCH) 200 UNIT/ML FlexTouch Pen, Inject 20 Units into the skin daily., Disp: 30 mL, Rfl: 3 ?  Insulin Pen Needle 32G X 4 MM MISC, Use to inject insulin., Disp: 200 each, Rfl: 2 ?  Insulin Syringe-Needle U-100 (RELION INSULIN SYRINGE) 31G X 15/64" 0.3 ML MISC, Use to inject insulin daily., Disp: 100 each, Rfl: 11 ?  ivabradine (CORLANOR) 5 MG TABS tablet, Take 1 tablet (5 mg total) by mouth 2 (two) times daily with a meal., Disp: 60 tablet, Rfl: 6 ?  levETIRAcetam (KEPPRA) 750 MG tablet, Take 2 tablets (1,500 mg total) by mouth 2 (two) times daily., Disp: 360 tablet, Rfl: 4 ?  Menthol, Topical Analgesic, (ICY HOT BACK EX), Apply 1 application topically as needed (neuropathy). Icy hot cream, Disp: , Rfl:  ?  metFORMIN (GLUCOPHAGE-XR) 500 MG 24 hr tablet, Take 1 tablet (500 mg total) by mouth 2 (two) times daily with a meal., Disp: 180 tablet, Rfl: 3 ?  metoprolol (TOPROL-XL) 200 MG 24 hr tablet, TAKE 0.5 TABLETS (100 MG TOTAL) BY MOUTH DAILY. TAKE WITH OR IMMEDIATELY FOLLOWING A MEAL., Disp: 30 tablet, Rfl: 2 ?  Oxcarbazepine (TRILEPTAL) 300 MG tablet, Take 2 tablets (600 mg total) by mouth 2 (two) times daily., Disp: 360 tablet, Rfl: 4 ?  phenytoin (DILANTIN) 100 MG ER capsule, Take 1 capsule (100 mg total) by mouth 3 (three) times daily., Disp: 270 capsule, Rfl: 4 ?  pregabalin (LYRICA) 300 MG capsule, Take 1 capsule (300 mg total) by mouth at bedtime., Disp: 30 capsule, Rfl: 5 ?  sacubitril-valsartan (ENTRESTO) 24-26 MG, Take 1 tablet by mouth 2 (two) times daily., Disp: 60 tablet, Rfl: 0 ?  spironolactone  (ALDACTONE) 25 MG tablet, Take 1 tablet (25 mg total) by mouth at bedtime., Disp: 90 tablet, Rfl: 3 ?  warfarin (COUMADIN) 5 MG tablet, Take 1-1.5 tablets (5-7.5 mg total) by mouth daily as directed by Coumadin clinic, Disp: 40 tablet, Rfl: 3 ?No Known Allergies ? ? ?Social History  ? ?Socioeconomic History  ? Marital status: Married  ?  Spouse name: Felicity Coyer  ? Number of children: 0  ? Years of education: Not on file  ? Highest education level: Not on file  ?Occupational History  ? Occupation: unemployed  ?Tobacco Use  ? Smoking status: Never  ? Smokeless tobacco: Never  ?Vaping Use  ? Vaping Use: Not on file  ?Substance and Sexual Activity  ? Alcohol use: Not Currently  ?  Comment: "occasional" when "hanging out with the wrong people" No recent  use.  ? Drug use: Not Currently  ?  Types: Marijuana  ?  Comment: occasional, last 2013  ? Sexual activity: Not Currently  ?Other Topics Concern  ? Not on file  ?Social History Narrative  ? Pt lives at home with his wife   ? Right handed   ? Caffeine- hardly any  ? ?Social Determinants of Health  ? ?Financial Resource Strain: High Risk  ? Difficulty of Paying Living Expenses: Hard  ?Food Insecurity: No Food Insecurity  ? Worried About Charity fundraiser in the Last Year: Never true  ? Ran Out of Food in the Last Year: Never true  ?Transportation Needs: Unmet Transportation Needs  ? Lack of Transportation (Medical): No  ? Lack of Transportation (Non-Medical): Yes  ?Physical Activity: Not on file  ?Stress: Not on file  ?Social Connections: Not on file  ?Intimate Partner Violence: Not on file  ? ? ?Physical Exam ?Vitals reviewed.  ?Constitutional:   ?   Appearance: Normal appearance. He is normal weight.  ?HENT:  ?   Head: Normocephalic.  ?   Nose: Nose normal.  ?   Mouth/Throat:  ?   Mouth: Mucous membranes are moist.  ?   Pharynx: Oropharynx is clear.  ?Eyes:  ?   Conjunctiva/sclera: Conjunctivae normal.  ?   Pupils: Pupils are equal, round, and reactive to light.   ?Cardiovascular:  ?   Rate and Rhythm: Normal rate and regular rhythm.  ?   Pulses: Normal pulses.  ?   Heart sounds: Normal heart sounds.  ?Pulmonary:  ?   Effort: Pulmonary effort is normal.  ?   Breath sounds: Normal br

## 2021-08-02 NOTE — H&P (View-Only) (Signed)
?Electrophysiology Office Follow up Visit Note:   ? ?Date:  08/04/2021  ? ?ID:  Jason Stevenson, DOB 10/19/1985, MRN [id number] ? ?PCP:  Johnson, Deborah B, MD  ?[facility] Cardiologist:  Peter Nishan, MD  ?[facility] Electrophysiologist:  Ardythe Klute T Karnell Vanderloop, MD  ? ? ?Interval History:   ? ?Jason Stevenson is a 36 y.o. male who presents for a follow up visit. They were last seen in clinic February 07, 2021.  That appointment was in follow-up for hospitalization in September 2022 for nonischemic cardiomyopathy.  I believe denies that he was indicated when I first met the patient but he did not have insurance and so this was deferred.  He has since been approved for Medicaid.  He is also been referred to [facility] for transplant evaluation.  [facility] is planning on listing him for transplant.  He is with working on getting enrolled in cardiac rehab to improve his strength.  He is referred back to me to discuss ICD implant. ? ?His wife is on the phone during today's visit.  Had a long chat today about the heart transplant process and expectations. ? ? ? ?  ? ?Past Medical History:  ?Diagnosis Date  ? Chronic systolic CHF (congestive heart failure) (HCC) 06/18/2015  ? Diabetes mellitus without complication (H[facility])   ? Hemorrhagic stroke ([facility])   ? Hyperlipidemia   ? Hypertension   ? Nonischemic cardiomyopathy ([facility]) Noted as early as 2007  ? Per chart review (cards consult note 2011), EF of 40% in 2007, down to 20-25% in 2011  ? ? ?Past Surgical History:  ?Procedure Laterality Date  ? None    ? RIGHT HEART CATH  06/21/2021  ? [facility]  ? RIGHT/LEFT HEART CATH AND CORONARY ANGIOGRAPHY N/A 11/02/2020  ? Procedure: RIGHT/LEFT HEART CATH AND CORONARY ANGIOGRAPHY;  Surgeon: McLean, Dalton S, MD;  Location: [facility];  Service: Cardiovascular;  Laterality: N/A;  ? ? ?Current Medications: ?Current Meds  ?Medication Sig  ? Accu-Chek Softclix Lancets lancets Use to check blood sugar three times daily.  ? atorvastatin  (LIPITOR) 80 MG tablet TAKE 1 TABLET (80 MG TOTAL) BY MOUTH DAILY.  ? Blood Glucose Monitoring Suppl (ACCU-CHEK GUIDE) w/Device KIT Use to check blood sugar three times daily.  ? Blood Pressure Monitor DEVI Use as directed to check home blood pressure 2-3 times a week  ? Continuous Blood Gluc Receiver (FREESTYLE LIBRE 2 READER) DEVI 1 each by Does not apply route daily.  ? dapagliflozin propanediol (FARXIGA) 5 MG TABS tablet TAKE 1 TABLET (5 MG TOTAL) BY MOUTH DAILY BEFORE BREAKFAST.  ? digoxin (LANOXIN) 0.125 MG tablet TAKE 1 TABLET (0.125 MG TOTAL) BY MOUTH DAILY.  ? DULoxetine (CYMBALTA) 30 MG capsule TAKE 1 CAPSULE (30 MG TOTAL) BY MOUTH DAILY.  ? Evolocumab (REPATHA SURECLICK) 140 MG/ML SOAJ Inject 1 pen into the skin every 14 (fourteen) days.  ? ezetimibe (ZETIA) 10 MG tablet TAKE 1 TABLET (10 MG TOTAL) BY MOUTH DAILY.  ? furosemide (LASIX) 20 MG tablet Take 1 tablet (20 mg total) by mouth daily.  ? glucose blood (ACCU-CHEK GUIDE) test strip Use to check blood sugar three times daily.  ? insulin aspart (FIASP FLEXTOUCH) 100 UNIT/ML FlexTouch Pen Inject 5-8 Units into the skin 3 (three) times daily before meals.  ? insulin degludec (TRESIBA FLEXTOUCH) 200 UNIT/ML FlexTouch Pen Inject 20 Units into the skin daily.  ? Insulin Pen Needle 32G X 4 MM MISC Use to inject insulin.  ? Insulin Syringe-Needle   U-100 (RELION INSULIN SYRINGE) 31G X 15/64" 0.3 ML MISC Use to inject insulin daily.  ? ivabradine (CORLANOR) 5 MG TABS tablet Take 1 tablet (5 mg total) by mouth 2 (two) times daily with a meal.  ? levETIRAcetam (KEPPRA) 750 MG tablet Take 2 tablets (1,500 mg total) by mouth 2 (two) times daily.  ? Menthol, Topical Analgesic, (ICY HOT BACK EX) Apply 1 application topically as needed (neuropathy). Icy hot cream  ? metFORMIN (GLUCOPHAGE-XR) 500 MG 24 hr tablet Take 1 tablet (500 mg total) by mouth 2 (two) times daily with a meal.  ? metoprolol (TOPROL-XL) 200 MG 24 hr tablet TAKE 0.5 TABLETS (100 MG TOTAL) BY MOUTH  DAILY. TAKE WITH OR IMMEDIATELY FOLLOWING A MEAL.  ? Oxcarbazepine (TRILEPTAL) 300 MG tablet Take 2 tablets (600 mg total) by mouth 2 (two) times daily.  ? phenytoin (DILANTIN) 100 MG ER capsule Take 1 capsule (100 mg total) by mouth 3 (three) times daily.  ? pregabalin (LYRICA) 300 MG capsule Take 1 capsule (300 mg total) by mouth at bedtime.  ? sacubitril-valsartan (ENTRESTO) 24-26 MG Take 1 tablet by mouth 2 (two) times daily.  ? spironolactone (ALDACTONE) 25 MG tablet Take 1 tablet (25 mg total) by mouth at bedtime.  ? warfarin (COUMADIN) 5 MG tablet Take 1-1.5 tablets (5-7.5 mg total) by mouth daily as directed by Coumadin clinic  ?  ? ?Allergies:   Patient has no known allergies.  ? ?Social History  ? ?Socioeconomic History  ? Marital status: Married  ?  Spouse name: [name]  ? Number of children: 0  ? Years of education: Not on file  ? Highest education level: Not on file  ?Occupational History  ? Occupation: unemployed  ?Tobacco Use  ? Smoking status: Never  ? Smokeless tobacco: Never  ?Vaping Use  ? Vaping Use: Not on file  ?Substance and Sexual Activity  ? Alcohol use: Not Currently  ?  Comment: "occasional" when "hanging out with the wrong people" No recent use.  ? Drug use: Not Currently  ?  Types: Marijuana  ?  Comment: occasional, last 2013  ? Sexual activity: Not Currently  ?Other Topics Concern  ? Not on file  ?Social History Narrative  ? Pt lives at home with his wife   ? Right handed   ? Caffeine- hardly any  ? ?Social Determinants of Health  ? ?Financial Resource Strain: High Risk  ? Difficulty of Paying Living Expenses: Hard  ?Food Insecurity: No Food Insecurity  ? Worried About Running Out of Food in the Last Year: Never true  ? Ran Out of Food in the Last Year: Never true  ?Transportation Needs: Unmet Transportation Needs  ? Lack of Transportation (Medical): No  ? Lack of Transportation (Non-Medical): Yes  ?Physical Activity: Not on file  ?Stress: Not on file  ?Social Connections: Not on file  ?   ? ?Family History: ?The patient's family history includes Diabetes in his mother; Hypertension in his mother; Stroke in his maternal aunt and mother. There is no history of Heart attack. ? ?ROS:   ?Please see the history of present illness.    ?All other systems reviewed and are negative. ? ?EKGs/Labs/Other Studies Reviewed:   ? ?The following studies were reviewed today: ? ?[facility] records ? ? ? ?EKG:  The ekg ordered today demonstrates sinus rhythm.  QRS duration 118 ms. ? ?Recent Labs: ?09/19/2020: Magnesium 1.9 ?09/22/2020: ALT 42 ?12/14/2020: B Natriuretic Peptide 54.0; TSH 1.741 ?08/03/2021: BUN 12; Creatinine, Ser   0.84; Hemoglobin 13.7; Platelets 275; Potassium 4.6; Sodium 142  ?Recent Lipid Panel ?   ?Component Value Date/Time  ? CHOL 107 06/08/2021 0851  ? TRIG 166 (H) 06/08/2021 0851  ? HDL 49 06/08/2021 0851  ? CHOLHDL 2.2 06/08/2021 0851  ? CHOLHDL 3.1 12/04/2020 1018  ? VLDL 23 12/04/2020 1018  ? LDLCALC 31 06/08/2021 0851  ? ? ?Physical Exam:   ? ?VS:  BP (!) 104/54   Pulse 68   Ht 5' 11" (1.803 m)   Wt 160 lb 3.2 oz (72.7 kg)   SpO2 98%   BMI 22.34 kg/m?    ? ?Wt Readings from Last 3 Encounters:  ?08/03/21 160 lb 3.2 oz (72.7 kg)  ?08/02/21 161 lb (73 kg)  ?07/25/21 160 lb (72.6 kg)  ?  ? ?GEN:  Well nourished, well developed in no acute distress.  Thin ?HEENT: Normal ?NECK: No JVD; No carotid bruits ?LYMPHATICS: No lymphadenopathy ?CARDIAC: RRR, no murmurs, rubs, gallops ?RESPIRATORY:  Clear to auscultation without rales, wheezing or rhonchi  ?ABDOMEN: Soft, non-tender, non-distended ?MUSCULOSKELETAL:  No edema; No deformity  ?SKIN: Warm and dry ?NEUROLOGIC:  Alert and oriented x 3 ?PSYCHIATRIC:  Normal affect  ? ? ? ?  ? ?ASSESSMENT:   ? ?1. Chronic systolic CHF (congestive heart failure) (HCC)   ?2. Non-ischemic cardiomyopathy (HCC)   ?3. LV (left ventricular) mural thrombus   ? ?PLAN:   ? ?In order of problems listed above: ? ?#Chronic systolic heart failure ?#Non-ischemic cardiomyopathy ? ?NYHA  class III-IV.  Warm and dry on exam.  Being followed by [facility] cardiology.  Pending cardiac transplant listing.  Presents back to me today to discuss ICD given pending transplant listing.  I have discussed ICD impla

## 2021-08-03 ENCOUNTER — Ambulatory Visit (INDEPENDENT_AMBULATORY_CARE_PROVIDER_SITE_OTHER): Payer: Medicaid Other | Admitting: Cardiology

## 2021-08-03 ENCOUNTER — Encounter: Payer: Self-pay | Admitting: *Deleted

## 2021-08-03 ENCOUNTER — Encounter: Payer: Self-pay | Admitting: Cardiology

## 2021-08-03 ENCOUNTER — Ambulatory Visit (INDEPENDENT_AMBULATORY_CARE_PROVIDER_SITE_OTHER): Payer: Medicaid Other

## 2021-08-03 VITALS — BP 104/54 | HR 68 | Ht 71.0 in | Wt 160.2 lb

## 2021-08-03 DIAGNOSIS — I639 Cerebral infarction, unspecified: Secondary | ICD-10-CM

## 2021-08-03 DIAGNOSIS — Z7901 Long term (current) use of anticoagulants: Secondary | ICD-10-CM | POA: Diagnosis not present

## 2021-08-03 DIAGNOSIS — I513 Intracardiac thrombosis, not elsewhere classified: Secondary | ICD-10-CM

## 2021-08-03 DIAGNOSIS — I5022 Chronic systolic (congestive) heart failure: Secondary | ICD-10-CM | POA: Diagnosis not present

## 2021-08-03 DIAGNOSIS — Z5181 Encounter for therapeutic drug level monitoring: Secondary | ICD-10-CM

## 2021-08-03 DIAGNOSIS — I428 Other cardiomyopathies: Secondary | ICD-10-CM

## 2021-08-03 LAB — POCT INR: INR: 2.3 (ref 2.0–3.0)

## 2021-08-03 NOTE — Patient Instructions (Signed)
Description   ?- Continue to take warfarin 1.5 tablets daily except for 1 tablet on Mondays, Wednesday and Fridays.  ?- Recheck INR in 3 weeks.  ?Coumadin Clinic [phone] ?  ?   ?

## 2021-08-03 NOTE — Patient Instructions (Addendum)
Medication Instructions:  ?Your physician recommends that you continue on your current medications as directed. Please refer to the Current Medication list given to you today. ?*If you need a refill on your cardiac medications before your next appointment, please call your pharmacy* ? ?Lab Work: ?CBC, BMP ?If you have labs (blood work) drawn today and your tests are completely normal, you will receive your results only by: ?MyChart Message (if you have MyChart) OR ?A paper copy in the mail ?If you have any lab test that is abnormal or we need to change your treatment, we will call you to review the results. ? ?Testing/Procedures: ?Your physician has recommended that you have a defibrillator inserted. An implantable cardioverter defibrillator (ICD) is a small device that is placed in your chest or, in rare cases, your abdomen. This device uses electrical pulses or shocks to help control life-threatening, irregular heartbeats that could lead the heart to suddenly stop beating (sudden cardiac arrest). Leads are attached to the ICD that goes into your heart. This is done in the hospital and usually requires an overnight stay. Please see the instruction sheet given to you today for more information.  ? ?Follow-Up: ?At [facility], you and your health needs are our priority.  As part of our continuing mission to provide you with exceptional heart care, we have created designated Provider Care Teams.  These Care Teams include your primary Cardiologist (physician) and Advanced Practice Providers (APPs -  Physician Assistants and Nurse Practitioners) who all work together to provide you with the care you need, when you need it. ? ?Your physician wants you to follow-up in: see instruction sheet.  ? ?We recommend signing up for the patient portal called "MyChart".  Sign up information is provided on this After Visit Summary.  MyChart is used to connect with patients for Virtual Visits (Telemedicine).  Patients are able to  view lab/test results, encounter notes, upcoming appointments, etc.  Non-urgent messages can be sent to your provider as well.   ?To learn more about what you can do with MyChart, go to [email or web address].   ? ?Any Other Special Instructions Will Be Listed Below (If Applicable). ? ?Cardioverter Defibrillator Implantation ?An implantable cardioverter defibrillator (ICD) is a device that identifies and corrects abnormal heart rhythms. Cardioverter defibrillator implantation is a surgery to place an ICD under the skin in the chest or abdomen. An ICD has a battery, a small computer (pulse generator), and wires (leads) that go into the heart. The ICD detects and corrects two types of dangerous irregular heart rhythms (arrhythmias): ?A rapid heart rhythm in the lower chambers of the heart (ventricles). This is called ventricular tachycardia. ?The ventricles contracting in an uncoordinated way. This is called ventricular fibrillation. ?There are different types of ICDs, and the electrical signals from the ICD can be programmed differently based on the condition being treated. The electrical signals from the ICD can be low-energy pulses, high-energy shocks, or a combination of the two. The low-energy pulses are generally used to restore the heartbeat to normal when it is either too slow (bradycardia) or too fast. These pulses are painless. The high-energy shocks are used to treat abnormal rhythms such as ventricular tachycardia or ventricular fibrillation. This shock may feel like a strong jolt in the chest. ?Your health care provider may recommend an ICD if you have: ?Had a ventricular arrhythmia in the past. ?A damaged heart because of a disease or heart condition. ?A weakened heart muscle from a heart attack or cardiac arrest. ?  A congenital heart defect. ?Long QT syndrome, which is a disorder of the heart's electrical system. ?Brugada syndrome, which is a condition that causes a disruption of the heart's normal  rhythm. ?Tell a health care provider about: ?Any allergies you have. ?All medicines you are taking, including vitamins, herbs, eye drops, creams, and over-the-counter medicines. ?Any problems you or family members have had with anesthetic medicines. ?Any blood disorders you have. ?Any surgeries you have had. ?Any medical conditions you have. ?Whether you are pregnant or may be pregnant. ?What are the risks? ?Generally, this is a safe procedure. However, problems may occur, including: ?Infection. ?Bleeding. ?Allergic reactions to medicines used during the procedure. ?Blood clots. ?Swelling or bruising. ?Damage to nearby structures or organs, such as nerves, lungs, blood vessels, or the heart where the ICD leads or pulse generator is implanted. ?What happens before the procedure? ?Staying hydrated ?Follow instructions from your health care provider about hydration, which may include: ?Up to 2 hours before the procedure - you may continue to drink clear liquids, such as water, clear fruit juice, black coffee, and plain tea. ? ?Eating and drinking restrictions ?Follow instructions from your health care provider about eating and drinking, which may include: ?8 hours before the procedure - stop eating heavy meals or foods, such as meat, fried foods, or fatty foods. ?6 hours before the procedure - stop eating light meals or foods, such as toast or cereal. ?6 hours before the procedure - stop drinking milk or drinks that contain milk. ?2 hours before the procedure - stop drinking clear liquids. ?Medicines ?Ask your health care provider about: ?Changing or stopping your regular medicines. This is especially important if you are taking diabetes medicines or blood thinners. ?Taking medicines such as aspirin and ibuprofen. These medicines can thin your blood. Do not take these medicines unless your health care provider tells you to take them. ?Taking over-the-counter medicines, vitamins, herbs, and supplements. ?Tests ?You may  have an exam or testing. These may include: ?Blood tests. ?A test to check the electrical signals in your heart (electrocardiogram, ECG). ?Imaging tests, such as a chest X-ray. ?Echocardiogram. This is an ultrasound of your heart to evaluate your heart structures and function. ?An event monitor or Holter monitor to wear at home. ?General instructions ?Do not use any products that contain nicotine or tobacco for at least 4 weeks before the procedure. These products include cigarettes, chewing tobacco, and vaping devices, such as e-cigarettes. If you need help quitting, ask your health care provider. ?Ask your health care provider: ?How your procedure site will be marked. ?What steps will be taken to help prevent infection. These may include: ?Removing hair at the surgery site. ?Washing skin with a germ-killing soap. ?Taking antibiotic medicine. ?You may be asked to shower with a germ-killing soap. ?Plan to have a responsible adult take you home from the hospital or clinic. ?What happens during the procedure? ? ?Small monitors will be put on your body. They will be used to check your heart rate, blood pressure, and oxygen level. ?A pair of sticky pads (defibrillator pads) may be placed on your back and chest. These pads are able to pace your heart as needed during the procedure. ?An IV will be inserted into one of your veins. ?You will be given one or more of the following: ?A medicine to help you relax (sedative). ?A medicine to numb the area (local anesthetic). ?A medicine to make you fall asleep(general anesthetic). ?A small incision will be made to create  a deep pocket under the skin of your chest or abdomen. ?Leads will be guided through a blood vessel into your heart and attached to your heart muscles. Depending on the ICD, the leads may go into one ventricle, or they may go into both ventricles and into an upper chamber of the heart. An X-ray machine (fluoroscope) will be used to help guide the leads. The other  end of the leads will be attached to the pulse generator. ?The pulse generator will be placed into the pocket under the skin. ?The ICD will be tested, and your health care provider will program the ICD fo

## 2021-08-04 LAB — BASIC METABOLIC PANEL
BUN/Creatinine Ratio: 14 (ref 9–20)
BUN: 12 mg/dL (ref 6–20)
CO2: 25 mmol/L (ref 20–29)
Calcium: 9 mg/dL (ref 8.7–10.2)
Chloride: 102 mmol/L (ref 96–106)
Creatinine, Ser: 0.84 mg/dL (ref 0.76–1.27)
Glucose: 180 mg/dL — ABNORMAL HIGH (ref 70–99)
Potassium: 4.6 mmol/L (ref 3.5–5.2)
Sodium: 142 mmol/L (ref 134–144)
eGFR: 115 mL/min/{1.73_m2} (ref >59)

## 2021-08-04 LAB — CBC WITH DIFFERENTIAL/PLATELET
Basophils Absolute: 0.1 10*3/uL (ref 0.0–0.2)
Basos: 1 %
EOS (ABSOLUTE): 0.1 10*3/uL (ref 0.0–0.4)
Eos: 2 %
Hematocrit: 40 % (ref 37.5–51.0)
Hemoglobin: 13.7 g/dL (ref 13.0–17.7)
Immature Grans (Abs): 0 10*3/uL (ref 0.0–0.1)
Immature Granulocytes: 0 %
Lymphocytes Absolute: 2 10*3/uL (ref 0.7–3.1)
Lymphs: 47 %
MCH: 28.5 pg (ref 26.6–33.0)
MCHC: 34.3 g/dL (ref 31.5–35.7)
MCV: 83 fL (ref 79–97)
Monocytes Absolute: 0.2 10*3/uL (ref 0.1–0.9)
Monocytes: 5 %
Neutrophils Absolute: 1.9 10*3/uL (ref 1.4–7.0)
Neutrophils: 45 %
Platelets: 275 10*3/uL (ref 150–450)
RBC: 4.81 x10E6/uL (ref 4.14–5.80)
RDW: 11.8 % (ref 11.6–15.4)
WBC: 4.2 10*3/uL (ref 3.4–10.8)

## 2021-08-06 NOTE — Telephone Encounter (Signed)
That's unusual that they would be given in the same week...  Alycia said we need to know what doses were given an when.  ?

## 2021-08-09 NOTE — Telephone Encounter (Signed)
I do not see anything in the package insert or in CDC guidelines that advise concurrent administration of 1st and 2nd HepB doses. You can give a Hep A and Hep B at the same time and that it common, but not two Hep B vaccines.  ? ?Hep B series in adults is usually 0, 1, and 6 months. ?

## 2021-08-09 NOTE — Telephone Encounter (Signed)
Sounds good. Thanks Heather ?

## 2021-08-09 NOTE — Telephone Encounter (Signed)
That was my understanding too.  I will share this with Maralyn Sago, EMT who sees him at home.  ?Thanks Franky Macho ?

## 2021-08-09 NOTE — Telephone Encounter (Signed)
Jason Stevenson, can you advise?  The patient reports getting his first and second doses of Hep B vaccine at the same time... and he is now requesting  the third dose  ?

## 2021-08-10 ENCOUNTER — Encounter: Payer: Self-pay | Admitting: Cardiology

## 2021-08-22 ENCOUNTER — Other Ambulatory Visit (HOSPITAL_COMMUNITY): Payer: Self-pay

## 2021-08-22 ENCOUNTER — Telehealth (HOSPITAL_COMMUNITY): Payer: Self-pay | Admitting: *Deleted

## 2021-08-22 ENCOUNTER — Other Ambulatory Visit: Payer: Self-pay | Admitting: Cardiology

## 2021-08-22 ENCOUNTER — Other Ambulatory Visit (HOSPITAL_COMMUNITY): Payer: Self-pay | Admitting: *Deleted

## 2021-08-22 DIAGNOSIS — Z7901 Long term (current) use of anticoagulants: Secondary | ICD-10-CM

## 2021-08-22 DIAGNOSIS — Z5181 Encounter for therapeutic drug level monitoring: Secondary | ICD-10-CM

## 2021-08-22 MED ORDER — WARFARIN SODIUM 5 MG PO TABS: 5.0000 mg | ORAL_TABLET | Freq: Every day | ORAL | 3 refills | Status: DC

## 2021-08-22 NOTE — Telephone Encounter (Signed)
Pt needs refill of coumadin sent to [facility]. ? ?Routed to [facility] ?

## 2021-08-22 NOTE — Telephone Encounter (Signed)
Prescription refill request received for warfarin ?Lov: 08/03/21 [facility])  ?Next INR check: 08/24/21 ?Warfarin tablet strength: 5mg  ? ?Appropriate dose and refill sent to requested pharmacy.  ?

## 2021-08-22 NOTE — Progress Notes (Signed)
Paramedicine Encounter ? ? ? Patient ID: Jason Stevenson, male    DOB: Dec 23, 1985, 36 y.o.   MRN: [id number] ? ?Arrived for home visit for [place] who reports feeling good with no complaints over the last few weeks. He reports some random feelings of chest tightness but overall denied any shortness of breath, dizziness, hypoglycemic episodes or trouble with daily activities.  ? ?Assessment and vitals obtained. No lower leg edema, no abdominal swelling or pain. Lungs clear. CBG- 106 (no food, or insulin) ? ?We reviewed meds and confirmed same. I filled pill box for one week. His wife has been doing same and successfully filling it. Refills were called in by her to [facility] before my arrival. He has 8 to get delivered.  ? ?He is requesting his coumadin be sent to [facility], I will send triage a message.  ? ?We discussed upcoming appointment on Monday for ICD Implant. He reports he is nervous but hopeful. We stopped Coumadin starting tomorrow, tonight is his last dose before procedure.  ? ?Appointments confirmed and reviewed. He has not heard from [place] [facility], I gave him the number to call and follow up. He also has not heard from [facility] reference any follow up.  ? ?He called to check on his disability status and they report it is still pending. I advised him to continue to follow up.  ? ?Home visit complete I plan to see [place] in one week following ICD implant.  ? ?Refills: (called in by wife to [facility]) ?-Trileptal ?-Arlyce Harman ?-Zetia ?-Cymbalta ?-Digoxin ?-Farxiga ?-Metformin ?-Keppra  ? ? ? ? ? ? ?Patient Care Team: ?Ladell Pier, MD as PCP - General (Internal Medicine) ?Josue Hector, MD as PCP - Cardiology (Cardiology) ?Vickie Epley, MD as PCP - Electrophysiology (Cardiology) ? ?Patient Active Problem List  ? Diagnosis Date Noted  ? LADA (latent autoimmune diabetes in adults), managed as type 2 ([place]) 03/28/2021  ? Poorly controlled type 2 diabetes mellitus with circulatory disorder ([place])  03/19/2021  ? Ischemic cardiomyopathy   ? Syncope 12/14/2020  ? Chest pain 10/30/2020  ? History of cerebrovascular accident (CVA) with residual deficit 10/27/2020  ? Protein-calorie malnutrition, severe 09/22/2020  ? Acute CVA (cerebrovascular accident) ([place]) 09/21/2020  ? Near syncope 09/21/2020  ? Encounter for monitoring Coumadin therapy 09/20/2020  ? LV (left ventricular) mural thrombus 09/13/2020  ? Seizure ([place]) 09/13/2020  ? Neurological deficit present 09/12/2020  ? History of COVID-19 09/12/2020  ? Intracerebral hemorrhage 09/12/2020  ? Acute cerebrovascular accident (CVA) ([place]) 09/12/2020  ? COVID-19 08/23/2020  ? Noncompliance with medication treatment due to intermittent use of medication 10/12/2018  ? Dyslipidemia 04/17/2016  ? Chronic systolic CHF (congestive heart failure) ([place]) 06/18/2015  ? Needs flu shot 12/23/2013  ? Non-ischemic cardiomyopathy ([place]) 11/06/2012  ? HTN (hypertension) 11/06/2012  ? HLD (hyperlipidemia) 11/06/2012  ? ? ?Current Outpatient Medications:  ?  Accu-Chek Softclix Lancets lancets, Use to check blood sugar three times daily., Disp: 100 each, Rfl: 3 ?  atorvastatin (LIPITOR) 80 MG tablet, TAKE 1 TABLET (80 MG TOTAL) BY MOUTH DAILY., Disp: 30 tablet, Rfl: 2 ?  Blood Glucose Monitoring Suppl (ACCU-CHEK GUIDE) w/Device KIT, Use to check blood sugar three times daily., Disp: 1 kit, Rfl: 0 ?  Blood Pressure Monitor DEVI, Use as directed to check home blood pressure 2-3 times a week, Disp: 1 Device, Rfl: 0 ?  Continuous Blood Gluc Receiver (FREESTYLE LIBRE 2 READER) DEVI, 1 each by Does not apply route daily.,  Disp: 1 each, Rfl: 0 ?  Continuous Blood Gluc Sensor (FREESTYLE LIBRE 2 SENSOR) MISC, USE EVERY 14 (FOURTEEN) DAYS., Disp: , Rfl:  ?  dapagliflozin propanediol (FARXIGA) 5 MG TABS tablet, TAKE 1 TABLET (5 MG TOTAL) BY MOUTH DAILY BEFORE BREAKFAST., Disp: 30 tablet, Rfl: 0 ?  digoxin (LANOXIN) 0.125 MG tablet, TAKE 1 TABLET (0.125 MG TOTAL) BY MOUTH DAILY., Disp: 30 tablet,  Rfl: 2 ?  DULoxetine (CYMBALTA) 30 MG capsule, TAKE 1 CAPSULE (30 MG TOTAL) BY MOUTH DAILY., Disp: 30 capsule, Rfl: 2 ?  Evolocumab (REPATHA SURECLICK) 163 MG/ML SOAJ, Inject 140 mg into the skin every 14 (fourteen) days., Disp: 2 mL, Rfl: 11 ?  ezetimibe (ZETIA) 10 MG tablet, TAKE 1 TABLET (10 MG TOTAL) BY MOUTH DAILY., Disp: 30 tablet, Rfl: 2 ?  furosemide (LASIX) 20 MG tablet, Take 1 tablet (20 mg total) by mouth daily., Disp: 30 tablet, Rfl: 3 ?  glucose blood (ACCU-CHEK GUIDE) test strip, Use to check blood sugar three times daily., Disp: 100 each, Rfl: 2 ?  insulin aspart (FIASP FLEXTOUCH) 100 UNIT/ML FlexTouch Pen, Inject 5-8 Units into the skin 3 (three) times daily before meals., Disp: 30 mL, Rfl: 3 ?  insulin degludec (TRESIBA FLEXTOUCH) 200 UNIT/ML FlexTouch Pen, Inject 20 Units into the skin daily., Disp: 30 mL, Rfl: 3 ?  Insulin Pen Needle 32G X 4 MM MISC, Use to inject insulin., Disp: 200 each, Rfl: 2 ?  Insulin Syringe-Needle U-100 (RELION INSULIN SYRINGE) 31G X 15/64" 0.3 ML MISC, Use to inject insulin daily., Disp: 100 each, Rfl: 11 ?  ivabradine (CORLANOR) 5 MG TABS tablet, Take 1 tablet (5 mg total) by mouth 2 (two) times daily with a meal., Disp: 60 tablet, Rfl: 6 ?  levETIRAcetam (KEPPRA) 750 MG tablet, Take 2 tablets (1,500 mg total) by mouth 2 (two) times daily., Disp: 360 tablet, Rfl: 4 ?  Menthol, Topical Analgesic, (ICY HOT BACK EX), Apply 1 application topically as needed (neuropathy). Icy hot cream, Disp: , Rfl:  ?  metFORMIN (GLUCOPHAGE-XR) 500 MG 24 hr tablet, Take 1 tablet (500 mg total) by mouth 2 (two) times daily with a meal., Disp: 180 tablet, Rfl: 3 ?  metoprolol (TOPROL-XL) 200 MG 24 hr tablet, TAKE 0.5 TABLETS (100 MG TOTAL) BY MOUTH DAILY. TAKE WITH OR IMMEDIATELY FOLLOWING A MEAL., Disp: 30 tablet, Rfl: 2 ?  Oxcarbazepine (TRILEPTAL) 300 MG tablet, Take 2 tablets (600 mg total) by mouth 2 (two) times daily., Disp: 360 tablet, Rfl: 4 ?  phenytoin (DILANTIN) 100 MG ER capsule,  Take 1 capsule (100 mg total) by mouth 3 (three) times daily., Disp: 270 capsule, Rfl: 4 ?  pregabalin (LYRICA) 300 MG capsule, Take 1 capsule (300 mg total) by mouth at bedtime., Disp: 30 capsule, Rfl: 5 ?  sacubitril-valsartan (ENTRESTO) 24-26 MG, Take 1 tablet by mouth 2 (two) times daily., Disp: 60 tablet, Rfl: 0 ?  spironolactone (ALDACTONE) 25 MG tablet, Take 1 tablet (25 mg total) by mouth at bedtime., Disp: 90 tablet, Rfl: 3 ?  warfarin (COUMADIN) 5 MG tablet, Take 1-1.5 tablets (5-7.5 mg total) by mouth daily as directed by Coumadin clinic (Patient taking differently: Take 5-7.5 mg by mouth daily. 7.5 mg Sun Tues Thurs and Sat, 5 mg on Mon Wed Fri), Disp: 40 tablet, Rfl: 3 ?No Known Allergies ? ? ?Social History  ? ?Socioeconomic History  ? Marital status: Married  ?  Spouse name: Felicity Coyer  ? Number of children: 0  ? Years of education:  Not on file  ? Highest education level: Not on file  ?Occupational History  ? Occupation: unemployed  ?Tobacco Use  ? Smoking status: Never  ? Smokeless tobacco: Never  ?Vaping Use  ? Vaping Use: Not on file  ?Substance and Sexual Activity  ? Alcohol use: Not Currently  ?  Comment: "occasional" when "hanging out with the wrong people" No recent use.  ? Drug use: Not Currently  ?  Types: Marijuana  ?  Comment: occasional, last 2013  ? Sexual activity: Not Currently  ?Other Topics Concern  ? Not on file  ?Social History Narrative  ? Pt lives at home with his wife   ? Right handed   ? Caffeine- hardly any  ? ?Social Determinants of Health  ? ?Financial Resource Strain: High Risk  ? Difficulty of Paying Living Expenses: Hard  ?Food Insecurity: No Food Insecurity  ? Worried About Charity fundraiser in the Last Year: Never true  ? Ran Out of Food in the Last Year: Never true  ?Transportation Needs: Unmet Transportation Needs  ? Lack of Transportation (Medical): No  ? Lack of Transportation (Non-Medical): Yes  ?Physical Activity: Not on file  ?Stress: Not on file  ?Social  Connections: Not on file  ?Intimate Partner Violence: Not on file  ? ? ?Physical Exam ? ? ? ? ? ?Future Appointments  ?Date Time Provider [place]  ?08/24/2021  9:30 AM [facility]

## 2021-08-25 ENCOUNTER — Other Ambulatory Visit: Payer: Self-pay | Admitting: Internal Medicine

## 2021-08-27 ENCOUNTER — Ambulatory Visit (HOSPITAL_COMMUNITY)
Admission: RE | Admit: 2021-08-27 | Discharge: 2021-08-28 | Disposition: A | Discharge status: Home / Self Care | Payer: Medicaid Other | Source: Ambulatory Visit | Attending: Cardiology | Admitting: Cardiology

## 2021-08-27 ENCOUNTER — Encounter (HOSPITAL_COMMUNITY)
Admission: RE | Disposition: A | Discharge status: Home / Self Care | Payer: Medicaid Other | Source: Ambulatory Visit | Attending: Cardiology

## 2021-08-27 ENCOUNTER — Encounter (HOSPITAL_COMMUNITY): Payer: Self-pay | Admitting: Cardiology

## 2021-08-27 ENCOUNTER — Other Ambulatory Visit: Payer: Self-pay

## 2021-08-27 DIAGNOSIS — I428 Other cardiomyopathies: Secondary | ICD-10-CM | POA: Diagnosis not present

## 2021-08-27 DIAGNOSIS — I5022 Chronic systolic (congestive) heart failure: Secondary | ICD-10-CM | POA: Insufficient documentation

## 2021-08-27 DIAGNOSIS — I513 Intracardiac thrombosis, not elsewhere classified: Secondary | ICD-10-CM | POA: Diagnosis not present

## 2021-08-27 DIAGNOSIS — Z5181 Encounter for therapeutic drug level monitoring: Secondary | ICD-10-CM

## 2021-08-27 DIAGNOSIS — Z5986 Financial insecurity: Secondary | ICD-10-CM | POA: Insufficient documentation

## 2021-08-27 DIAGNOSIS — Z7901 Long term (current) use of anticoagulants: Secondary | ICD-10-CM | POA: Diagnosis not present

## 2021-08-27 DIAGNOSIS — I110 Hypertensive heart disease with heart failure: Secondary | ICD-10-CM | POA: Insufficient documentation

## 2021-08-27 HISTORY — PX: ICD IMPLANT: EP1208

## 2021-08-27 LAB — GLUCOSE, CAPILLARY
Glucose-Capillary: 193 mg/dL — ABNORMAL HIGH (ref 70–99)
Glucose-Capillary: 148 mg/dL — ABNORMAL HIGH (ref 70–99)
Glucose-Capillary: 272 mg/dL — ABNORMAL HIGH (ref 70–99)

## 2021-08-27 LAB — PROTIME-INR
INR: 1.2 (ref 0.8–1.2)
Prothrombin Time: 14.7 seconds (ref 11.4–15.2)

## 2021-08-27 SURGERY — ICD IMPLANT
Anesthesia: LOCAL

## 2021-08-27 MED ORDER — PHENYTOIN SODIUM EXTENDED 100 MG PO CAPS
100.0000 mg | ORAL_CAPSULE | Freq: Three times a day (TID) | ORAL | Status: DC
  Administered 2021-08-27 – 2021-08-28 (×2): 100 mg via ORAL
  Filled 2021-08-27 (×2): qty 1

## 2021-08-27 MED ORDER — CEFAZOLIN SODIUM-DEXTROSE 2-4 GM/100ML-% IV SOLN
2.0000 g | INTRAVENOUS | Status: AC
  Administered 2021-08-27: 2 g via INTRAVENOUS

## 2021-08-27 MED ORDER — LIDOCAINE HCL 1 % IJ SOLN
INTRAMUSCULAR | Status: AC
  Filled 2021-08-27: qty 20

## 2021-08-27 MED ORDER — HEPARIN (PORCINE) IN NACL 1000-0.9 UT/500ML-% IV SOLN
INTRAVENOUS | Status: AC
  Filled 2021-08-27: qty 500

## 2021-08-27 MED ORDER — OXCARBAZEPINE 150 MG PO TABS
600.0000 mg | ORAL_TABLET | Freq: Two times a day (BID) | ORAL | Status: DC
  Administered 2021-08-27 – 2021-08-28 (×2): 600 mg via ORAL
  Filled 2021-08-27 (×2): qty 4

## 2021-08-27 MED ORDER — MIDAZOLAM HCL 5 MG/5ML IJ SOLN
INTRAMUSCULAR | Status: DC | PRN
Start: 2021-08-27 — End: 2021-08-27
  Administered 2021-08-27: 1 mg via INTRAVENOUS

## 2021-08-27 MED ORDER — HEPARIN (PORCINE) IN NACL 1000-0.9 UT/500ML-% IV SOLN
INTRAVENOUS | Status: DC | PRN
Start: 2021-08-27 — End: 2021-08-27
  Administered 2021-08-27: 500 mL

## 2021-08-27 MED ORDER — FUROSEMIDE 20 MG PO TABS
20.0000 mg | ORAL_TABLET | Freq: Every day | ORAL | Status: DC
  Administered 2021-08-28: 20 mg via ORAL
  Filled 2021-08-27: qty 1

## 2021-08-27 MED ORDER — IVABRADINE HCL 5 MG PO TABS
5.0000 mg | ORAL_TABLET | Freq: Two times a day (BID) | ORAL | Status: DC
Start: 2021-08-27 — End: 2021-08-28
  Administered 2021-08-27 – 2021-08-28 (×2): 5 mg via ORAL
  Filled 2021-08-27 (×2): qty 1

## 2021-08-27 MED ORDER — DIGOXIN 125 MCG PO TABS
0.1250 mg | ORAL_TABLET | Freq: Every day | ORAL | Status: DC
  Administered 2021-08-28: 0.125 mg via ORAL
  Filled 2021-08-27: qty 1

## 2021-08-27 MED ORDER — CHLORHEXIDINE GLUCONATE 4 % EX LIQD
4.0000 "application " | Freq: Once | CUTANEOUS | Status: DC
  Filled 2021-08-27: qty 60

## 2021-08-27 MED ORDER — SODIUM CHLORIDE 0.9 % IV SOLN: INTRAVENOUS | Status: DC

## 2021-08-27 MED ORDER — INSULIN ASPART 100 UNIT/ML IJ SOLN
0.0000 [IU] | Freq: Three times a day (TID) | INTRAMUSCULAR | Status: DC
  Administered 2021-08-27: 1 [IU] via SUBCUTANEOUS

## 2021-08-27 MED ORDER — METFORMIN HCL ER 500 MG PO TB24
500.0000 mg | ORAL_TABLET | Freq: Two times a day (BID) | ORAL | Status: DC
Start: 2021-08-28 — End: 2021-08-28
  Administered 2021-08-28: 500 mg via ORAL
  Filled 2021-08-27: qty 1

## 2021-08-27 MED ORDER — CEFAZOLIN SODIUM-DEXTROSE 2-4 GM/100ML-% IV SOLN
INTRAVENOUS | Status: AC
  Filled 2021-08-27: qty 100

## 2021-08-27 MED ORDER — LEVETIRACETAM 500 MG PO TABS
1500.0000 mg | ORAL_TABLET | Freq: Two times a day (BID) | ORAL | Status: DC
  Administered 2021-08-27 – 2021-08-28 (×2): 1500 mg via ORAL
  Filled 2021-08-27 (×2): qty 3

## 2021-08-27 MED ORDER — SODIUM CHLORIDE 0.9 % IV SOLN
INTRAVENOUS | Status: AC
  Filled 2021-08-27: qty 2

## 2021-08-27 MED ORDER — PREGABALIN 100 MG PO CAPS
300.0000 mg | ORAL_CAPSULE | Freq: Every day | ORAL | Status: DC
  Administered 2021-08-27: 300 mg via ORAL
  Filled 2021-08-27: qty 3

## 2021-08-27 MED ORDER — FENTANYL CITRATE (PF) 100 MCG/2ML IJ SOLN
INTRAMUSCULAR | Status: DC | PRN
  Administered 2021-08-27: 25 ug via INTRAVENOUS

## 2021-08-27 MED ORDER — POVIDONE-IODINE 10 % EX SWAB
2.0000 "application " | Freq: Once | CUTANEOUS | Status: AC
  Administered 2021-08-27: 2 via TOPICAL

## 2021-08-27 MED ORDER — ONDANSETRON HCL 4 MG/2ML IJ SOLN: 4.0000 mg | Freq: Four times a day (QID) | INTRAMUSCULAR | Status: DC | PRN

## 2021-08-27 MED ORDER — LIDOCAINE HCL (PF) 1 % IJ SOLN
INTRAMUSCULAR | Status: DC | PRN
  Administered 2021-08-27: 60 mL

## 2021-08-27 MED ORDER — SODIUM CHLORIDE 0.9 % IV SOLN
80.0000 mg | INTRAVENOUS | Status: AC
  Administered 2021-08-27: 80 mg

## 2021-08-27 MED ORDER — FENTANYL CITRATE (PF) 100 MCG/2ML IJ SOLN
INTRAMUSCULAR | Status: AC
  Filled 2021-08-27: qty 2

## 2021-08-27 MED ORDER — DULOXETINE HCL 30 MG PO CPEP
30.0000 mg | ORAL_CAPSULE | Freq: Every day | ORAL | Status: DC
  Administered 2021-08-28: 30 mg via ORAL
  Filled 2021-08-27: qty 1

## 2021-08-27 MED ORDER — METOPROLOL SUCCINATE ER 100 MG PO TB24
100.0000 mg | ORAL_TABLET | Freq: Every day | ORAL | Status: DC
Start: 2021-08-28 — End: 2021-08-28
  Administered 2021-08-28: 100 mg via ORAL
  Filled 2021-08-27: qty 1

## 2021-08-27 MED ORDER — ACETAMINOPHEN 325 MG PO TABS
325.0000 mg | ORAL_TABLET | ORAL | Status: DC | PRN
  Administered 2021-08-28: 650 mg via ORAL
  Filled 2021-08-27: qty 2

## 2021-08-27 MED ORDER — EZETIMIBE 10 MG PO TABS
10.0000 mg | ORAL_TABLET | Freq: Every day | ORAL | Status: DC
  Administered 2021-08-28: 10 mg via ORAL
  Filled 2021-08-27: qty 1

## 2021-08-27 MED ORDER — SPIRONOLACTONE 25 MG PO TABS
25.0000 mg | ORAL_TABLET | Freq: Every day | ORAL | Status: DC
  Administered 2021-08-27: 25 mg via ORAL
  Filled 2021-08-27: qty 1

## 2021-08-27 MED ORDER — SACUBITRIL-VALSARTAN 24-26 MG PO TABS
1.0000 | ORAL_TABLET | Freq: Two times a day (BID) | ORAL | Status: DC
  Administered 2021-08-27 – 2021-08-28 (×2): 1 via ORAL
  Filled 2021-08-27 (×2): qty 1

## 2021-08-27 MED ORDER — INSULIN ASPART 100 UNIT/ML IJ SOLN
0.0000 [IU] | Freq: Every day | INTRAMUSCULAR | Status: DC
  Administered 2021-08-27: 3 [IU] via SUBCUTANEOUS

## 2021-08-27 MED ORDER — MIDAZOLAM HCL 5 MG/5ML IJ SOLN
INTRAMUSCULAR | Status: AC
  Filled 2021-08-27: qty 5

## 2021-08-27 MED ORDER — DAPAGLIFLOZIN PROPANEDIOL 5 MG PO TABS
5.0000 mg | ORAL_TABLET | Freq: Every day | ORAL | Status: DC
  Administered 2021-08-28: 5 mg via ORAL
  Filled 2021-08-27: qty 1

## 2021-08-27 SURGICAL SUPPLY — 8 items
CABLE SURGICAL S-101-97-12 (CABLE) ×2 IMPLANT
ICD VISIA MRI VR DVFB1D4 (ICD Generator) IMPLANT
LEAD SPRINT QUAT SEC 6935M-62 (Lead) ×1 IMPLANT
PAD DEFIB RADIO PHYSIO CONN (PAD) ×2 IMPLANT
SHEATH 9FR PRELUDE SNAP 13 (SHEATH) ×1 IMPLANT
SHEATH PROBE COVER 6X72 (BAG) ×1 IMPLANT
TRAY PACEMAKER INSERTION (PACKS) ×2 IMPLANT
VISIA MRI VR DVFB1D4 (ICD Generator) ×2 IMPLANT

## 2021-08-27 NOTE — Interval H&P Note (Signed)
History and Physical Interval Note: ? ?08/27/2021 ?2:50 PM ? ?Jason Stevenson  has presented today for surgery, with the diagnosis of cardiomyopathy.  The various methods of treatment have been discussed with the patient and family. After consideration of risks, benefits and other options for treatment, the patient has consented to  Procedure(s): ?ICD IMPLANT (N/A) as a surgical intervention.  The patient's history has been reviewed, patient examined, no change in status, stable for surgery.  I have reviewed the patient's chart and labs.  Questions were answered to the patient's satisfaction.   ? ? ?Renaldo Gornick T Brianda Beitler ? ? ?

## 2021-08-28 ENCOUNTER — Ambulatory Visit (HOSPITAL_COMMUNITY): Payer: Medicaid Other

## 2021-08-28 ENCOUNTER — Encounter (HOSPITAL_COMMUNITY): Payer: Self-pay | Admitting: Cardiology

## 2021-08-28 DIAGNOSIS — I110 Hypertensive heart disease with heart failure: Secondary | ICD-10-CM | POA: Diagnosis not present

## 2021-08-28 DIAGNOSIS — I513 Intracardiac thrombosis, not elsewhere classified: Secondary | ICD-10-CM | POA: Diagnosis not present

## 2021-08-28 DIAGNOSIS — Z7901 Long term (current) use of anticoagulants: Secondary | ICD-10-CM | POA: Diagnosis not present

## 2021-08-28 DIAGNOSIS — I5022 Chronic systolic (congestive) heart failure: Secondary | ICD-10-CM | POA: Diagnosis not present

## 2021-08-28 DIAGNOSIS — I428 Other cardiomyopathies: Secondary | ICD-10-CM | POA: Diagnosis not present

## 2021-08-28 DIAGNOSIS — Z5986 Financial insecurity: Secondary | ICD-10-CM | POA: Diagnosis not present

## 2021-08-28 DIAGNOSIS — Z95810 Presence of automatic (implantable) cardiac defibrillator: Secondary | ICD-10-CM | POA: Diagnosis not present

## 2021-08-28 LAB — GLUCOSE, CAPILLARY: Glucose-Capillary: 106 mg/dL — ABNORMAL HIGH (ref 70–99)

## 2021-08-28 MED ORDER — ACETAMINOPHEN 325 MG PO TABS: 325.0000 mg | ORAL_TABLET | ORAL | Status: AC | PRN

## 2021-08-28 MED ORDER — WARFARIN SODIUM 5 MG PO TABS: 5.0000 mg | ORAL_TABLET | Freq: Every day | ORAL | 3 refills | Status: DC

## 2021-08-28 NOTE — Discharge Summary (Signed)
? ? ? ?ELECTROPHYSIOLOGY PROCEDURE DISCHARGE SUMMARY  ? ? ?Patient ID: Jason Stevenson,  ?MRN: [id number], DOB/AGE: Oct 08, 1985 36 y.o. ? ?Admit date: 08/27/2021 ?Discharge date: 08/28/2021 ? ?Primary Care Physician: Ladell Pier, MD  ?Primary Cardiologist: Jenkins Rouge, MD  ?Electrophysiologist: Vickie Epley, MD  ? ?Primary Diagnosis:  ?Chronic systolic CHF  ? ?Secondary Diagnosis: ?NICM ? ?No Known Allergies ? ? ?Procedures This Admission:  ?1.  Implantation of a Medtronic single chamber ICD on 08/27/2021 by Dr. Quentin Ore.  The patient received a Medtronic model number [id number] ICD with model number [id number] right ventricular lead. DFT's were deferred at time of implant.  There were no immediate post procedure complications. ?2.  CXR on 08/28/21 demonstrated no pneumothorax status post device implantation. ? ?Brief HPI: ?Jason Stevenson is a 37 y.o. male was referred to electrophysiology in the outpatient setting  for consideration of ICD implantation.  Past medical history includes above.  The patient has persistent LV dysfunction despite guideline directed therapy.  Risks, benefits, and alternatives to ICD implantation were reviewed with the patient who wished to proceed.  ? ?Hospital Course:  ?The patient was admitted and underwent implantation of a Medtronic single chamber ICD with details as outlined above. They were monitored on telemetry overnight which demonstrated NSR.  Left chest was without hematoma or ecchymosis.  The device was interrogated and found to be functioning normally.  CXR was obtained and demonstrated no pneumothorax status post device implantation..  Wound care, arm mobility, and restrictions were reviewed with the patient.  The patient was examined and considered stable for discharge to home.  ? ?The patient's discharge medications include an ACE-I/ARB/ARNI Delene Loll) and beta blocker (toprol). Regarding blood thinner therapy, they were instructed to resume their coumadin on  Saturday, 5/20.  ? ?Physical Exam: ?Vitals:  ? 08/27/21 1900 08/27/21 1948 08/28/21 0100 08/28/21 0528  ?BP: 110/66 112/72  113/80  ?Pulse: 68  71 70  ?Resp: _0 ?Temp: 98.6 ?F (37 ?C)  97.8 ?F (36.6 ?C) (!) 97.5 ?F (36.4 ?C)  ?TempSrc: Oral  Axillary Oral  ?SpO2:    98%  ?Weight:      ?Height:      ? ? ?GEN- The patient is well appearing, alert and oriented x 3 today.   ?HEENT: normocephalic, atraumatic; sclera clear, conjunctiva pink; hearing intact; oropharynx clear; neck supple, no JVP ?Lymph- no cervical lymphadenopathy ?Lungs- Clear to ausculation bilaterally, normal work of breathing.  No wheezes, rales, rhonchi ?Heart- Regular rate and rhythm, no murmurs, rubs or gallops, PMI not laterally displaced ?GI- soft, non-tender, non-distended, bowel sounds present, no hepatosplenomegaly ?Extremities- no clubbing, cyanosis, or edema; DP/PT/radial pulses 2+ bilaterally ?MS- no significant deformity or atrophy ?Skin- warm and dry, no rash or lesion. ICD site stable without hematoma ?Psych- euthymic mood, full affect ?Neuro- strength and sensation are intact ? ? ?Labs: ?  ?Lab Results  ?Component Value Date  ? WBC 4.2 08/03/2021  ? HGB 13.7 08/03/2021  ? HCT 40.0 08/03/2021  ? MCV 83 08/03/2021  ? PLT 275 08/03/2021  ? No results for input(s): NA, K, CL, CO2, BUN, CREATININE, CALCIUM, PROT, BILITOT, ALKPHOS, ALT, AST, GLUCOSE in the last 168 hours. ? ?Invalid input(s): LABALBU ? ?Discharge Medications:  ?Allergies as of 08/28/2021   ?No Known Allergies ?  ? ?  ?Medication List  ?  ? ?TAKE these medications   ? ?Accu-Chek Guide test strip ?Generic drug: glucose blood ?Use to check blood sugar three times daily. ?  ?  Accu-Chek Guide w/Device Kit ?Use to check blood sugar three times daily. ?  ?Accu-Chek Softclix Lancets lancets ?Use to check blood sugar three times daily. ?  ?acetaminophen 325 MG tablet ?Commonly known as: TYLENOL ?Take 1-2 tablets (325-650 mg total) by mouth every 4 (four) hours as needed for mild  pain. ?  ?atorvastatin 80 MG tablet ?Commonly known as: LIPITOR ?TAKE 1 TABLET (80 MG TOTAL) BY MOUTH DAILY. ?  ?Blood Pressure Monitor Devi ?Use as directed to check home blood pressure 2-3 times a week ?  ?digoxin 0.125 MG tablet ?Commonly known as: LANOXIN ?TAKE 1 TABLET (0.125 MG TOTAL) BY MOUTH DAILY. ?  ?DULoxetine 30 MG capsule ?Commonly known as: CYMBALTA ?TAKE 1 CAPSULE (30 MG TOTAL) BY MOUTH DAILY. ?  ?ezetimibe 10 MG tablet ?Commonly known as: ZETIA ?TAKE 1 TABLET (10 MG TOTAL) BY MOUTH DAILY. ?  ?Farxiga 5 MG Tabs tablet ?Generic drug: dapagliflozin propanediol ?TAKE 1 TABLET (5 MG TOTAL) BY MOUTH DAILY BEFORE BREAKFAST. ?  ?Fiasp FlexTouch 100 UNIT/ML FlexTouch Pen ?Generic drug: insulin aspart ?Inject 5-8 Units into the skin 3 (three) times daily before meals. ?  ?FreeStyle Libre 2 Reader Kerrin Mo ?1 each by Does not apply route daily. ?  ?FreeStyle Libre 2 Sensor Misc ?USE EVERY 14 (FOURTEEN) DAYS. ?  ?furosemide 20 MG tablet ?Commonly known as: LASIX ?TAKE 1 TABLET (20 MG TOTAL) BY MOUTH DAILY. ?  ?ICY HOT BACK EX ?Apply 1 application topically as needed (neuropathy). Icy hot cream ?  ?Insulin Pen Needle 32G X 4 MM Misc ?Use to inject insulin. ?  ?Insulin Syringe-Needle U-100 31G X 15/64" 0.3 ML Misc ?Commonly known as: ReliOn Insulin Syringe ?Use to inject insulin daily. ?  ?ivabradine 5 MG Tabs tablet ?Commonly known as: Corlanor ?Take 1 tablet (5 mg total) by mouth 2 (two) times daily with a meal. ?  ?levETIRAcetam 750 MG tablet ?Commonly known as: KEPPRA ?Take 2 tablets (1,500 mg total) by mouth 2 (two) times daily. ?  ?metFORMIN 500 MG 24 hr tablet ?Commonly known as: GLUCOPHAGE-XR ?Take 1 tablet (500 mg total) by mouth 2 (two) times daily with a meal. ?  ?metoprolol 200 MG 24 hr tablet ?Commonly known as: TOPROL-XL ?TAKE 0.5 TABLETS (100 MG TOTAL) BY MOUTH DAILY. TAKE WITH OR IMMEDIATELY FOLLOWING A MEAL. ?  ?Oxcarbazepine 300 MG tablet ?Commonly known as: TRILEPTAL ?Take 2 tablets (600 mg total)  by mouth 2 (two) times daily. ?  ?phenytoin 100 MG ER capsule ?Commonly known as: DILANTIN ?Take 1 capsule (100 mg total) by mouth 3 (three) times daily. ?  ?pregabalin 300 MG capsule ?Commonly known as: Lyrica ?Take 1 capsule (300 mg total) by mouth at bedtime. ?  ?Repatha SureClick 213 MG/ML Soaj ?Generic drug: Evolocumab ?Inject 140 mg into the skin every 14 (fourteen) days. ?  ?sacubitril-valsartan 24-26 MG ?Commonly known as: ENTRESTO ?Take 1 tablet by mouth 2 (two) times daily. ?  ?spironolactone 25 MG tablet ?Commonly known as: ALDACTONE ?Take 1 tablet (25 mg total) by mouth at bedtime. ?  ?Tyler Aas FlexTouch 200 UNIT/ML FlexTouch Pen ?Generic drug: insulin degludec ?Inject 20 Units into the skin daily. ?  ?warfarin 5 MG tablet ?Commonly known as: COUMADIN ?Take as directed. If you are unsure how to take this medication, talk to your nurse or doctor. ?Original instructions: Take 1-1.5 tablets (5-7.5 mg total) by mouth daily as directed by Coumadin clinic ?Start taking on: Sep 01, 2021 ?What changed: These instructions start on Sep 01, 2021. If you are  unsure what to do until then, ask your doctor or other care provider. ?  ? ?  ? ? ?Disposition:  ? ? Follow-up Information   ? ? [place] Follow up.   ?Why: on 5/25 at 12 noon for post ICD visit ?Contact information: ?[address] ?[place] [address] ?[phone] ? ?  ?  ? ?  ?  ? ?  ? ? ?Duration of Discharge Encounter: Greater than 30 minutes including physician time. ? ?Signed, ?Shirley Friar, PA-C  ?08/28/2021 ?8:37 AM ? ? ?  ?

## 2021-08-28 NOTE — Progress Notes (Signed)
Discharge instructions, RX's and follow up appts explained and provided to patient and wife verbalized understanding. Patient left floor via wheelchair accompanied by staff. No c/o pain or shortness of breath at d/c. ? ?[name], Kae Heller, RN ? ?

## 2021-08-28 NOTE — Plan of Care (Signed)
?  Problem: Education: ?Goal: Knowledge of General Education information will improve ?Description: Including pain rating scale, medication(s)/side effects and non-pharmacologic comfort measures ?Outcome: Adequate for Discharge ?  ?Problem: Health Behavior/Discharge Planning: ?Goal: Ability to manage health-related needs will improve ?Outcome: Adequate for Discharge ?  ?Problem: Education: ?Goal: Knowledge of cardiac device and self-care will improve ?Outcome: Adequate for Discharge ?Goal: Ability to safely manage health related needs after discharge will improve ?Outcome: Adequate for Discharge ?Goal: Individualized Educational Video(s) ?Outcome: Adequate for Discharge ?  ?Problem: Cardiac: ?Goal: Ability to achieve and maintain adequate cardiopulmonary perfusion will improve ?Outcome: Adequate for Discharge ?  ?

## 2021-08-28 NOTE — Discharge Instructions (Signed)
After Your ICD ?(Implantable Cardiac Defibrillator) ? ? ?You have a Medtronic ICD ? ?ACTIVITY ?Do not lift your arm above shoulder height for 1 week after your procedure. After 7 days, you may progress as below.  ?You should remove your sling 24 hours after your procedure, unless otherwise instructed by your provider.  ? ? ? Tuesday Sep 04, 2021  Wednesday Sep 05, 2021 Thursday Sep 06, 2021 Friday Sep 07, 2021  ? ?Do not lift, push, pull, or carry anything over 10 pounds with the affected arm until 6 weeks (Tuesday October 09, 2021 ) after your procedure.  ? ?You may drive AFTER your wound check, unless you have been told otherwise by your provider.  ? ?Ask your healthcare provider when you can go back to work ? ? ?INCISION/Dressing ?If you are on a blood thinner such as Coumadin, Xarelto, Eliquis, Plavix, or Pradaxa please confirm with your provider when this should be resumed.  ? ?If large square, outer bandage is left in place, this can be removed after 24 hours from your procedure. Do not remove steri-strips or glue as below.  ? ?Monitor your defibrillator site for redness, swelling, and drainage. Call the device clinic at [phone] if you experience these symptoms or fever/chills. ? ?If your incision is sealed with Steri-strips or staples, you may shower 7 days after your procedure or when told by your provider. Do not remove the steri-strips or let the shower hit directly on your site. You may wash around your site with soap and water.   ? ?If you were discharged in a sling, please do not wear this during the day more than 48 hours after your surgery unless otherwise instructed. This may increase the risk of stiffness and soreness in your shoulder.  ? ?Avoid lotions, ointments, or perfumes over your incision until it is well-healed. ? ?You may use a hot tub or a pool AFTER your wound check appointment if the incision is completely closed. ? ?Your ICD is designed to protect you from life threatening heart  rhythms. Because of this, you may receive a shock.  ? ?1 shock with no symptoms:  Call the office during business hours. ?1 shock with symptoms (chest pain, chest pressure, dizziness, lightheadedness, shortness of breath, overall feeling unwell):  Call 911. ?If you experience 2 or more shocks in 24 hours:  Call 911. ?If you receive a shock, you should not drive for 6 months per the [place] DMV IF you receive appropriate therapy from your ICD.  ? ?ICD Alerts:  Some alerts are vibratory and others beep. These are NOT emergencies. Please call our office to let us know. If this occurs at night or on weekends, it can wait until the next business day. Send a remote transmission. ? ?If your device is capable of reading fluid status (for heart failure), you will be offered monthly monitoring to review this with you.  ? ?DEVICE MANAGEMENT ?Remote monitoring is used to monitor your ICD from home. This monitoring is scheduled every 91 days by our office. It allows [place] to keep an eye on the functioning of your device to ensure it is working properly. You will routinely see your Electrophysiologist annually (more often if necessary).  ? ?You should receive your ID card for your new device in 4-8 weeks. Keep this card with you at all times once received. Consider wearing a medical alert bracelet or necklace. ? ?Your ICD  may be MRI compatible. This will be discussed at your next  office visit/wound check.  You should avoid contact with strong electric or magnetic fields.  ? ?Do not use amateur (ham) radio equipment or electric (arc) welding torches. MP3 player headphones with magnets should not be used. Some devices are safe to use if held at least 12 inches (30 cm) from your defibrillator. These include power tools, lawn mowers, and speakers. If you are unsure if something is safe to use, ask your health care provider. ? ?When using your cell phone, hold it to the ear that is on the opposite side from the defibrillator. Do not leave  your cell phone in a pocket over the defibrillator. ? ?You may safely use electric blankets, heating pads, computers, and microwave ovens. ? ?Call the office right away if: ?You have chest pain. ?You feel more than one shock. ?You feel more short of breath than you have felt before. ?You feel more light-headed than you have felt before. ?Your incision starts to open up. ? ?This information is not intended to replace advice given to you by your health care provider. Make sure you discuss any questions you have with your health care provider. ? ? ?  ?

## 2021-09-03 ENCOUNTER — Telehealth (HOSPITAL_COMMUNITY): Payer: Self-pay | Admitting: *Deleted

## 2021-09-03 NOTE — Telephone Encounter (Signed)
Pt called to check on the status of his cardiac rehab referral.  Spoke with pt.  Advised that we did receive the reimbursement form for medicaid from Dr. Shirlee Latch on 5/15.  Also noted pt had a defib placed on 5/15.  Pt will need to hold on upper body exercises for 6 weeks.  Next follow up is on 6/9. Pt advised will review this visit note for readiness to move forward to scheduling for late June early July.  Pt verbalized understanding. Alanson Aly, BSN Cardiac and Emergency planning/management officer

## 2021-09-04 ENCOUNTER — Encounter (HOSPITAL_COMMUNITY): Payer: Self-pay | Admitting: Cardiology

## 2021-09-05 ENCOUNTER — Other Ambulatory Visit (HOSPITAL_COMMUNITY): Payer: Self-pay

## 2021-09-05 ENCOUNTER — Ambulatory Visit (INDEPENDENT_AMBULATORY_CARE_PROVIDER_SITE_OTHER): Payer: Medicaid Other

## 2021-09-05 DIAGNOSIS — Z7901 Long term (current) use of anticoagulants: Secondary | ICD-10-CM

## 2021-09-05 DIAGNOSIS — Z5181 Encounter for therapeutic drug level monitoring: Secondary | ICD-10-CM

## 2021-09-05 DIAGNOSIS — I513 Intracardiac thrombosis, not elsewhere classified: Secondary | ICD-10-CM | POA: Diagnosis not present

## 2021-09-05 DIAGNOSIS — I639 Cerebral infarction, unspecified: Secondary | ICD-10-CM | POA: Diagnosis not present

## 2021-09-05 LAB — POCT INR: INR: 1.3 — AB (ref 2.0–3.0)

## 2021-09-05 NOTE — Progress Notes (Signed)
Paramedicine Encounter    Patient ID: Jason Stevenson, male    DOB: January 12, 1986, 36 y.o.   MRN: [id number]  Arrived for home visit for [facility]. He reports feeling good but does complain of some "stinging" pain over the site of his newly implanted ICD. I assessed the site and dressing in place over the site and it does not appear red, hot to touch and has no leakage noted. He states he has felt some "fluttering" feelings over the ICD site and he reports it moves to his left shoulder at times but denied any shortness of breath or dizziness.   Vitals and assessment obtained. No swelling, weight stable.   Blood sugars have been maintaining but he has had a few hyperglycemic episodes and one hypoglycemic of 58 on May 20th around 0300. He denied any symptoms with these.   He reports he has been compliant with the restrictions given by EP doctors. He will follow up with them tomorrow.   He has been med compliant. I reviewed meds and confirmed same and filled pill box.    Refills called in by patient prior to my arrival- Zetia Spironolactone  We reviewed appointments and he agreed to home visit in one week.           Patient Care Team: Ladell Pier, MD as PCP - General (Internal Medicine) Josue Hector, MD as PCP - Cardiology (Cardiology) Vickie Epley, MD as PCP - Electrophysiology (Cardiology)  Patient Active Problem List   Diagnosis Date Noted   Nonischemic cardiomyopathy [facility]) 08/27/2021   LADA (latent autoimmune diabetes in adults), managed as type 2 ([place]) 03/28/2021   Poorly controlled type 2 diabetes mellitus with circulatory disorder ([place]) 03/19/2021   Ischemic cardiomyopathy    Syncope 12/14/2020   Chest pain 10/30/2020   History of cerebrovascular accident (CVA) with residual deficit 10/27/2020   Protein-calorie malnutrition, severe 09/22/2020   Acute CVA (cerebrovascular accident) ([place]) 09/21/2020   Near syncope 09/21/2020   Encounter for monitoring  Coumadin therapy 09/20/2020   LV (left ventricular) mural thrombus 09/13/2020   Seizure ([place]) 09/13/2020   Neurological deficit present 09/12/2020   History of COVID-19 09/12/2020   Intracerebral hemorrhage 09/12/2020   Acute cerebrovascular accident (CVA) ([place]) 09/12/2020   COVID-19 08/23/2020   Noncompliance with medication treatment due to intermittent use of medication 10/12/2018   Dyslipidemia 78/29/5621   Chronic systolic CHF (congestive heart failure) ([place]) 06/18/2015   Needs flu shot 12/23/2013   Non-ischemic cardiomyopathy ([place]) 11/06/2012   HTN (hypertension) 11/06/2012   HLD (hyperlipidemia) 11/06/2012    Current Outpatient Medications:    Accu-Chek Softclix Lancets lancets, Use to check blood sugar three times daily., Disp: 100 each, Rfl: 3   acetaminophen (TYLENOL) 325 MG tablet, Take 1-2 tablets (325-650 mg total) by mouth every 4 (four) hours as needed for mild pain., Disp: , Rfl:    atorvastatin (LIPITOR) 80 MG tablet, TAKE 1 TABLET (80 MG TOTAL) BY MOUTH DAILY., Disp: 30 tablet, Rfl: 2   Blood Glucose Monitoring Suppl (ACCU-CHEK GUIDE) w/Device KIT, Use to check blood sugar three times daily., Disp: 1 kit, Rfl: 0   Blood Pressure Monitor DEVI, Use as directed to check home blood pressure 2-3 times a week, Disp: 1 Device, Rfl: 0   Continuous Blood Gluc Receiver (FREESTYLE LIBRE 2 READER) DEVI, 1 each by Does not apply route daily., Disp: 1 each, Rfl: 0   Continuous Blood Gluc Sensor (FREESTYLE LIBRE 2 SENSOR) MISC, USE EVERY 14 (FOURTEEN) DAYS., Disp: ,  Rfl:    dapagliflozin propanediol (FARXIGA) 5 MG TABS tablet, TAKE 1 TABLET (5 MG TOTAL) BY MOUTH DAILY BEFORE BREAKFAST., Disp: 30 tablet, Rfl: 0   digoxin (LANOXIN) 0.125 MG tablet, TAKE 1 TABLET (0.125 MG TOTAL) BY MOUTH DAILY., Disp: 30 tablet, Rfl: 2   DULoxetine (CYMBALTA) 30 MG capsule, TAKE 1 CAPSULE (30 MG TOTAL) BY MOUTH DAILY., Disp: 30 capsule, Rfl: 2   Evolocumab (REPATHA SURECLICK) 062 MG/ML SOAJ, Inject 140  mg into the skin every 14 (fourteen) days., Disp: 2 mL, Rfl: 11   ezetimibe (ZETIA) 10 MG tablet, TAKE 1 TABLET (10 MG TOTAL) BY MOUTH DAILY., Disp: 30 tablet, Rfl: 2   furosemide (LASIX) 20 MG tablet, TAKE 1 TABLET (20 MG TOTAL) BY MOUTH DAILY., Disp: 30 tablet, Rfl: 3   glucose blood (ACCU-CHEK GUIDE) test strip, Use to check blood sugar three times daily., Disp: 100 each, Rfl: 2   insulin aspart (FIASP FLEXTOUCH) 100 UNIT/ML FlexTouch Pen, Inject 5-8 Units into the skin 3 (three) times daily before meals., Disp: 30 mL, Rfl: 3   insulin degludec (TRESIBA FLEXTOUCH) 200 UNIT/ML FlexTouch Pen, Inject 20 Units into the skin daily., Disp: 30 mL, Rfl: 3   Insulin Pen Needle 32G X 4 MM MISC, Use to inject insulin., Disp: 200 each, Rfl: 2   Insulin Syringe-Needle U-100 (RELION INSULIN SYRINGE) 31G X 15/64" 0.3 ML MISC, Use to inject insulin daily., Disp: 100 each, Rfl: 11   ivabradine (CORLANOR) 5 MG TABS tablet, Take 1 tablet (5 mg total) by mouth 2 (two) times daily with a meal., Disp: 60 tablet, Rfl: 6   levETIRAcetam (KEPPRA) 750 MG tablet, Take 2 tablets (1,500 mg total) by mouth 2 (two) times daily., Disp: 360 tablet, Rfl: 4   Menthol, Topical Analgesic, (ICY HOT BACK EX), Apply 1 application topically as needed (neuropathy). Icy hot cream, Disp: , Rfl:    metFORMIN (GLUCOPHAGE-XR) 500 MG 24 hr tablet, Take 1 tablet (500 mg total) by mouth 2 (two) times daily with a meal., Disp: 180 tablet, Rfl: 3   metoprolol (TOPROL-XL) 200 MG 24 hr tablet, TAKE 0.5 TABLETS (100 MG TOTAL) BY MOUTH DAILY. TAKE WITH OR IMMEDIATELY FOLLOWING A MEAL., Disp: 30 tablet, Rfl: 2   Oxcarbazepine (TRILEPTAL) 300 MG tablet, Take 2 tablets (600 mg total) by mouth 2 (two) times daily., Disp: 360 tablet, Rfl: 4   phenytoin (DILANTIN) 100 MG ER capsule, Take 1 capsule (100 mg total) by mouth 3 (three) times daily., Disp: 270 capsule, Rfl: 4   pregabalin (LYRICA) 300 MG capsule, Take 1 capsule (300 mg total) by mouth at bedtime.,  Disp: 30 capsule, Rfl: 5   sacubitril-valsartan (ENTRESTO) 24-26 MG, Take 1 tablet by mouth 2 (two) times daily., Disp: 60 tablet, Rfl: 0   spironolactone (ALDACTONE) 25 MG tablet, Take 1 tablet (25 mg total) by mouth at bedtime., Disp: 90 tablet, Rfl: 3   warfarin (COUMADIN) 5 MG tablet, Take 1-1.5 tablets (5-7.5 mg total) by mouth daily as directed by Coumadin clinic, Disp: 40 tablet, Rfl: 3 No Known Allergies   Social History   Socioeconomic History   Marital status: Married    Spouse name: [name]   Number of children: 0   Years of education: Not on file   Highest education level: Not on file  Occupational History   Occupation: unemployed  Tobacco Use   Smoking status: Never   Smokeless tobacco: Never  Vaping Use   Vaping Use: Not on file  Substance and Sexual  Activity   Alcohol use: Not Currently    Comment: "occasional" when "hanging out with the wrong people" No recent use.   Drug use: Not Currently    Types: Marijuana    Comment: occasional, last 2013   Sexual activity: Not Currently  Other Topics Concern   Not on file  Social History Narrative   Pt lives at home with his wife    Right handed    Caffeine- hardly any   Social Determinants of Health   Financial Resource Strain: High Risk   Difficulty of Paying Living Expenses: Hard  Food Insecurity: No Food Insecurity   Worried About Charity fundraiser in the Last Year: Never true   Ran Out of Food in the Last Year: Never true  Transportation Needs: Unmet Transportation Needs   Lack of Transportation (Medical): No   Lack of Transportation (Non-Medical): Yes  Physical Activity: Not on file  Stress: Not on file  Social Connections: Not on file  Intimate Partner Violence: Not on file    Physical Exam      Future Appointments  Date Time Provider [place]  09/06/2021 12:00 PM [facility] DEVICE 1 [facility]  09/12/2021  1:45 PM [facility]IN [facility]   09/21/2021 11:00 AM Larey Dresser, MD [facility] None  11/15/2021  9:20 AM Philemon Kingdom, MD [facility] None  11/29/2021  7:00 AM [facility] DEVICE REMOTES [facility]  12/05/2021  3:00 PM Vickie Epley, MD [facility]  02/28/2022  7:00 AM [facility] DEVICE REMOTES [facility]  05/30/2022  7:00 AM [facility] DEVICE REMOTES [facility]  07/01/2022  1:30 PM Penumalli, Earlean Polka, MD [facility] None  08/29/2022  7:00 AM [facility] DEVICE REMOTES [facility]  11/28/2022  7:00 AM [facility] DEVICE REMOTES [facility]     ACTION: Home visit completed

## 2021-09-05 NOTE — Patient Instructions (Signed)
Description   Take 1.5 tablets today and tomorrow, then resume same dosage of Warfarin 1.5 tablets daily except for 1 tablet on Mondays, Wednesday and Fridays.  Recheck INR in 1 week.  Coumadin Clinic [phone]

## 2021-09-06 ENCOUNTER — Ambulatory Visit (INDEPENDENT_AMBULATORY_CARE_PROVIDER_SITE_OTHER): Payer: Medicaid Other

## 2021-09-06 DIAGNOSIS — I428 Other cardiomyopathies: Secondary | ICD-10-CM | POA: Diagnosis not present

## 2021-09-06 NOTE — Patient Instructions (Addendum)
   After Your ICD (Implantable Cardiac Defibrillator)    Monitor your defibrillator site for redness, swelling, and drainage. Call the device clinic at [phone] if you experience these symptoms or fever/chills.  Do NOT shower until cleared at next wound check.   Do not lift, push or pull greater than 10 pounds with the affected arm until 6 weeks after your procedure. There are no other restrictions in arm movement after your wound check appointment.  Your ICD is designed to protect you from life threatening heart rhythms. Because of this, you may receive a shock.   1 shock with no symptoms:  Call the office during business hours. 1 shock with symptoms (chest pain, chest pressure, dizziness, lightheadedness, shortness of breath, overall feeling unwell):  Call 911. If you experience 2 or more shocks in 24 hours:  Call 911. If you receive a shock, you should not drive.  [place] DMV - no driving for 6 months if you receive appropriate therapy from your ICD.   ICD Alerts:  Some alerts are vibratory and others beep. These are NOT emergencies. Please call our office to let us know. If this occurs at night or on weekends, it can wait until the next business day. Send a remote transmission.  If your device is capable of reading fluid status (for heart failure), you will be offered monthly monitoring to review this with you.   Remote monitoring is used to monitor your ICD from home. This monitoring is scheduled every 91 days by our office. It allows [place] to keep an eye on the functioning of your device to ensure it is working properly. You will routinely see your Electrophysiologist annually (more often if necessary).

## 2021-09-07 LAB — CUP PACEART INCLINIC DEVICE CHECK
Battery Remaining Longevity: 134 mo
Battery Voltage: 3.04 V
Brady Statistic RV Percent Paced: 0.01 %
Date Time Interrogation Session: 20230525122100
HighPow Impedance: 63 Ohm
Implantable Lead Implant Date: 20230515
Implantable Lead Location: 753860
Implantable Pulse Generator Implant Date: 20230515
Lead Channel Impedance Value: 342 Ohm
Lead Channel Impedance Value: 437 Ohm
Lead Channel Pacing Threshold Amplitude: 0.75 V
Lead Channel Pacing Threshold Pulse Width: 0.4 ms
Lead Channel Sensing Intrinsic Amplitude: 2.5 mV
Lead Channel Sensing Intrinsic Amplitude: 3 mV
Lead Channel Setting Pacing Amplitude: 3.5 V
Lead Channel Setting Pacing Pulse Width: 0.4 ms
Lead Channel Setting Sensing Sensitivity: 0.3 mV

## 2021-09-07 NOTE — Progress Notes (Signed)

## 2021-09-12 ENCOUNTER — Ambulatory Visit (INDEPENDENT_AMBULATORY_CARE_PROVIDER_SITE_OTHER): Payer: Medicaid Other

## 2021-09-12 DIAGNOSIS — I428 Other cardiomyopathies: Secondary | ICD-10-CM

## 2021-09-12 DIAGNOSIS — Z7901 Long term (current) use of anticoagulants: Secondary | ICD-10-CM | POA: Diagnosis not present

## 2021-09-12 DIAGNOSIS — Z5181 Encounter for therapeutic drug level monitoring: Secondary | ICD-10-CM | POA: Diagnosis not present

## 2021-09-12 DIAGNOSIS — I513 Intracardiac thrombosis, not elsewhere classified: Secondary | ICD-10-CM | POA: Diagnosis not present

## 2021-09-12 DIAGNOSIS — I639 Cerebral infarction, unspecified: Secondary | ICD-10-CM

## 2021-09-12 LAB — POCT INR: INR: 1.5 — AB (ref 2.0–3.0)

## 2021-09-12 NOTE — Patient Instructions (Signed)
Description   Take 1.5 tablets today and 2 tablets tomorrow, then resume same dosage of Warfarin 1.5 tablets daily except for 1 tablet on Mondays, Wednesday and Fridays.  Recheck INR in 1 week.  Coumadin Clinic [phone]

## 2021-09-12 NOTE — Progress Notes (Signed)
Patient seen in device clinic for wound recheck. Steristrips removed from icision and edges were well approximated. No redness, drainage or swelling noted to site. Wound care education given to patient with verbal understanding.

## 2021-09-12 NOTE — Patient Instructions (Signed)
   After Your Pacemaker   Monitor your pacemaker site for redness, swelling, and drainage. Call the device clinic at [phone] if you experience these symptoms or fever/chills.  You may now shower, wash incision daily with warm soapy water and clean wash cloth each time. Pat dry and leave dry open to air. Do not apply anything to incision site until completely healed.

## 2021-09-21 ENCOUNTER — Ambulatory Visit (HOSPITAL_COMMUNITY)
Admission: RE | Admit: 2021-09-21 | Discharge: 2021-09-21 | Disposition: A | Discharge status: Home / Self Care | Payer: Medicaid Other | Source: Ambulatory Visit | Attending: Cardiology | Admitting: Cardiology

## 2021-09-21 ENCOUNTER — Encounter (HOSPITAL_COMMUNITY): Payer: Self-pay | Admitting: Cardiology

## 2021-09-21 VITALS — BP 140/100 | HR 67 | Wt 160.0 lb

## 2021-09-21 DIAGNOSIS — I513 Intracardiac thrombosis, not elsewhere classified: Secondary | ICD-10-CM | POA: Insufficient documentation

## 2021-09-21 DIAGNOSIS — E7849 Other hyperlipidemia: Secondary | ICD-10-CM

## 2021-09-21 DIAGNOSIS — Z7901 Long term (current) use of anticoagulants: Secondary | ICD-10-CM | POA: Insufficient documentation

## 2021-09-21 DIAGNOSIS — Z955 Presence of coronary angioplasty implant and graft: Secondary | ICD-10-CM | POA: Insufficient documentation

## 2021-09-21 DIAGNOSIS — Z7984 Long term (current) use of oral hypoglycemic drugs: Secondary | ICD-10-CM | POA: Insufficient documentation

## 2021-09-21 DIAGNOSIS — R569 Unspecified convulsions: Secondary | ICD-10-CM | POA: Diagnosis not present

## 2021-09-21 DIAGNOSIS — R55 Syncope and collapse: Secondary | ICD-10-CM | POA: Insufficient documentation

## 2021-09-21 DIAGNOSIS — Z794 Long term (current) use of insulin: Secondary | ICD-10-CM | POA: Diagnosis not present

## 2021-09-21 DIAGNOSIS — I110 Hypertensive heart disease with heart failure: Secondary | ICD-10-CM | POA: Insufficient documentation

## 2021-09-21 DIAGNOSIS — Z7902 Long term (current) use of antithrombotics/antiplatelets: Secondary | ICD-10-CM | POA: Diagnosis not present

## 2021-09-21 DIAGNOSIS — E119 Type 2 diabetes mellitus without complications: Secondary | ICD-10-CM | POA: Diagnosis not present

## 2021-09-21 DIAGNOSIS — I5022 Chronic systolic (congestive) heart failure: Secondary | ICD-10-CM | POA: Diagnosis not present

## 2021-09-21 DIAGNOSIS — I428 Other cardiomyopathies: Secondary | ICD-10-CM | POA: Diagnosis not present

## 2021-09-21 DIAGNOSIS — I69398 Other sequelae of cerebral infarction: Secondary | ICD-10-CM | POA: Insufficient documentation

## 2021-09-21 DIAGNOSIS — Z79899 Other long term (current) drug therapy: Secondary | ICD-10-CM | POA: Insufficient documentation

## 2021-09-21 LAB — BASIC METABOLIC PANEL
Anion gap: 7 (ref 5–15)
BUN: 15 mg/dL (ref 6–20)
CO2: 27 mmol/L (ref 22–32)
Calcium: 9.1 mg/dL (ref 8.9–10.3)
Chloride: 103 mmol/L (ref 98–111)
Creatinine, Ser: 0.73 mg/dL (ref 0.61–1.24)
GFR, Estimated: >60 mL/min (ref >60)
Glucose, Bld: 237 mg/dL — ABNORMAL HIGH (ref 70–99)
Potassium: 4.3 mmol/L (ref 3.5–5.1)
Sodium: 137 mmol/L (ref 135–145)

## 2021-09-21 LAB — LIPID PANEL
Cholesterol: 133 mg/dL (ref 0–200)
HDL: 47 mg/dL (ref >40)
LDL Cholesterol: 56 mg/dL (ref 0–99)
Total CHOL/HDL Ratio: 2.8 RATIO
Triglycerides: 149 mg/dL (ref <150)
VLDL: 30 mg/dL (ref 0–40)

## 2021-09-21 LAB — DIGOXIN LEVEL: Digoxin Level: 0.3 ng/mL — ABNORMAL LOW (ref 0.8–2.0)

## 2021-09-21 MED ORDER — ENTRESTO 49-51 MG PO TABS: 1.0000 | ORAL_TABLET | Freq: Two times a day (BID) | ORAL | 3 refills | Status: DC

## 2021-09-21 NOTE — Patient Instructions (Signed)
Medication Changes:  STOP Furosemide  Increase Entresto to 49/51 mg Twice daily   Lab Work:  Labs done today, your results will be available in MyChart, we will contact you for abnormal readings.  Your physician recommends that you return for lab work in: 1-2 weeks  Testing/Procedures:  None  Referrals:  None  Special Instructions // Education:  Do the following things EVERYDAY: Weigh yourself in the morning before breakfast. Write it down and keep it in a log. Take your medicines as prescribed Eat low salt foods--Limit salt (sodium) to 2000 mg per day.  Stay as active as you can everyday Limit all fluids for the day to less than 2 liters   Follow-Up in: 6 weeks  At the [facility], you and your health needs are our priority. We have a designated team specialized in the treatment of Heart Failure. This Care Team includes your primary Heart Failure Specialized Cardiologist (physician), Advanced Practice Providers (APPs- Physician Assistants and Nurse Practitioners), and Pharmacist who all work together to provide you with the care you need, when you need it.   You may see any of the following providers on your designated Care Team at your next follow up:  Dr Arvilla Meres Dr Carron Curie, NP Robbie Lis, [place] [facility] [place], [place] Karle Plumber, PharmD   Please be sure to bring in all your medications bottles to every appointment.   Need to Contact us:  If you have any questions or concerns before your next appointment please send [place] a message through [place] or call our office at [phone].    TO LEAVE A MESSAGE FOR THE NURSE SELECT OPTION 2, PLEASE LEAVE A MESSAGE INCLUDING: YOUR NAME DATE OF BIRTH CALL BACK NUMBER REASON FOR CALL**this is important as we prioritize the call backs  YOU WILL RECEIVE A CALL BACK THE SAME DAY AS LONG AS YOU CALL BEFORE 4:00 PM

## 2021-09-22 NOTE — Progress Notes (Signed)
PCP: Dr. Karle Plumber Primary Cardiologist: Dr. Aundra Dubin  HPI: 36 y.o.male with diabetes, HTN, and a long history of nonischemic cardiomyopathy. He had a cardiac MRI in 1/08 showing low EF, but he says that he had been told about "heart problems" even prior to that.  He does not have a family history of cardiomyopathy that he knows of, but does not know his father's family.  Coronary CT angiogram in 2014 showed no CAD. Echo in 2/17 showed EF 20-25%, similar to 2014. Had repeat echo in 2020 showing LVEF 15-20%, RV normal. He had COVID-19 in 4/22.   He presented to [facility] ED 09/11/20 with tingling in left arm, left face, drooling and blank stare for about 1 week, and admitted for possible seizure, HHS and possible CVA.  In ED, he had left-sided neurologic deficit with dysarthria.  CT head with probable subacute infarct in right frontal lobe and old right parietal infarct.  Reportedly, patient's neurologic deficits resolved in ED, and focus shifted to possible seizure versus stroke.  He was loaded with Keppra.  MRI brain with probable hemorrhagic infarct involving the anterior right frontal lobe and left parietal occipital cortex with additional chronic right parietal infarct, right frontotemporal region enhancement suggesting changes related to acute seizure, possible toxic insult involving the caudate and lentiform nuclei bilaterally.  Neurology recommended interval follow-up brain MRI.  CTA head and neck without significant finding.  TTE with EF of 10-15%, LV thrombus, moderate RV dysfunction and dilated IVC.  He was discharged on GDMT, but A1c too high for SGLT2i.    Seen in [facility] ED 09/21/20 for fall after syncopal episode on toilet, hitting his head. CT of the head was significant for no bleeding but new low-density left cerebellum suggesting acute infarct. EEG unremarkable and felt fall secondary to vagal episode not seizure.  He had atypical chest pain at his hospital follow up. With his history of HLD and DM,  decisions was made to undergo [facility] to evaluate coronary anatomy and hemodynamics.  R/LHC (7/22) showed normal filling pressures, preserved cardiac output and no significant CAD. RA mean 1, RV 28/1, PA 19/7 mean 12, PCWP 3, CO/CI 5.54/2.92.  Again admitted 12/14/20-12/16/20 for syncopal episode. CT head negative. He was given IVF, beta blocker/spiro/Entresto held initially. Suspected orthostatic vs micturition syncope. EP consulted with known low EF and plans for ICD implant when a1c better controlled.  CPX in 9/22 showed severe HF limitation. Echo was done today and reviewed, EF < 20% with mild LV dilation, no LV thrombus noted, normal RV size with mildly decreased systolic function, IVC normal.   Followup 11/22, weight up about 10 lbs.  Patient had a seizure thought to be related to hypoglycemia (paramedics called, improved with glucose). Lasix 20 mg started every other day, eventually increased to daily.   Mantoloking 3/23 at [facility] as part of pre-transplant work up showed normal pressures, RA 7, RV 23/4, PA 14, PCWP 11, CI 3.5.  Patient is currently being worked up for transplant listing.  Needs to do cardiac rehab.   Medtronic ICD placed 5/23.   Today he returns for HF follow p with his wife.   He is symptomatically stable.  He has exertional fatigue and dyspnea after walking 100-200 feet, this is chronic.  Able to walk in stores.  No orthopnea/PND.  No chest pain.  Rare mild lightheadedness if he stands too fast.  Taking Lasix 20 mg daily.  Weight stable.   Labs (9/22): K 3.5, creatinine 0.85, digoxin 0.4 Labs (  10/22): K 4.1, creatinine 0.8, digoxin 0.9 Labs (3/23): K 4.5, creatinine 0.9 Labs (4/23): K 4.6, creatinine 0.89  ECG (personally reviewed): NSR, right axis deviation, nonspecific T wave changes  MDT ICD: No VT/AF noted.   ROS: All systems negative except as listed in HPI, PMH and Problem List.  PMH:  1. Type 1 diabetes: Generally poorly controlled.  2. 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  3. H/o LV thrombus 4. Syncope: Vagal vs orthostatic, likely not arrhythmic.  5. Diabetic polyneuropathy.  6. Chronic systolic CHF: Nonischemic cardiomyopathy.  Long-standing.  - Cardiac MRI (1/08): EF 45%, no late gadolinium enhancement.  - Coronary CTA (7/14): No significant CAD - Echo (7/14): EF 20-25%, severe LV dilation.  - Echo (6/22): EF 10-15%, LV thrombus, mild LV dilation, moderately decreased RV systolic function with normal size, mild MR, dilated IVC.  - LHC/RHC (7/22): No CAD; mean RA 1, PA 19/7, mean PCWP 3, CI 2.92.  - Echo (10/22): EF < 20%, mild LV dilation, normal RV size with mildly decreased systolic function, IVC normal.  - CPX (9/22): peak VO2 10.4, VE/VCO2 slope 26, RER 1.22 => severe HF limitation.  - RHC (3/23) at [facility] RA 7, RV 23/4, PA 14, PCWP 11, CI 3.5 - Medtronic ICD placed 5/23.  7. 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   SH:  Social History   Socioeconomic History   Marital status: Married    Spouse name: [name]   Number of children: 0   Years of education: Not on file   Highest education level: Not on file  Occupational History   Occupation: unemployed  Tobacco Use   Smoking status: Never   Smokeless tobacco: Never  Vaping Use   Vaping Use: Not on file  Substance and Sexual Activity   Alcohol use: Not Currently    Comment: "occasional" when "hanging out with the wrong people" No recent use.   Drug use: Not Currently    Types: Marijuana    Comment: occasional, last 2013   Sexual activity: Not Currently  Other Topics Concern   Not on file  Social History Narrative   Pt lives at home with his wife    Right handed    Caffeine- hardly any   Social Determinants of Health   Financial Resource Strain: High Risk (12/15/2020)   Overall Financial Resource Strain (CARDIA)    Difficulty of Paying Living Expenses: Hard  Food Insecurity: No Food Insecurity (09/14/2020)   Hunger Vital Sign    Worried  About Running Out of Food in the Last Year: Never true    [place] in the Last Year: Never true  Recent Concern: Food Insecurity - Food Insecurity Present (09/13/2020)   Hunger Vital Sign    Worried About Running Out of Food in the Last Year: Never true    Ran Out of Food in the Last Year: Sometimes true  Transportation Needs: Unmet Transportation Needs (12/15/2020)   PRAPARE - Hydrologist (Medical): No    Lack of Transportation (Non-Medical): Yes  Physical Activity: Not on file  Stress: Not on file  Social Connections: Not on file  Intimate Partner Violence: Not on file   FH:  Family History  Problem Relation Age of Onset   Stroke Mother    Diabetes Mother    Hypertension Mother    Stroke Maternal Aunt    Heart attack Neg Hx     Current  Outpatient Medications  Medication Sig Dispense Refill   Accu-Chek Softclix Lancets lancets Use to check blood sugar three times daily. 100 each 3   acetaminophen (TYLENOL) 325 MG tablet Take 1-2 tablets (325-650 mg total) by mouth every 4 (four) hours as needed for mild pain.     atorvastatin (LIPITOR) 80 MG tablet TAKE 1 TABLET (80 MG TOTAL) BY MOUTH DAILY. 30 tablet 2   Blood Glucose Monitoring Suppl (ACCU-CHEK GUIDE) w/Device KIT Use to check blood sugar three times daily. 1 kit 0   Blood Pressure Monitor DEVI Use as directed to check home blood pressure 2-3 times a week 1 Device 0   Continuous Blood Gluc Receiver (FREESTYLE LIBRE 2 READER) DEVI 1 each by Does not apply route daily. 1 each 0   Continuous Blood Gluc Sensor (FREESTYLE LIBRE 2 SENSOR) MISC USE EVERY 14 (FOURTEEN) DAYS.     dapagliflozin propanediol (FARXIGA) 5 MG TABS tablet TAKE 1 TABLET (5 MG TOTAL) BY MOUTH DAILY BEFORE BREAKFAST. 30 tablet 0   digoxin (LANOXIN) 0.125 MG tablet TAKE 1 TABLET (0.125 MG TOTAL) BY MOUTH DAILY. 30 tablet 2   DULoxetine (CYMBALTA) 30 MG capsule TAKE 1 CAPSULE (30 MG TOTAL) BY MOUTH DAILY. 30 capsule 2   Evolocumab  (REPATHA SURECLICK) 381 MG/ML SOAJ Inject 140 mg into the skin every 14 (fourteen) days. 2 mL 11   ezetimibe (ZETIA) 10 MG tablet TAKE 1 TABLET (10 MG TOTAL) BY MOUTH DAILY. 30 tablet 2   glucose blood (ACCU-CHEK GUIDE) test strip Use to check blood sugar three times daily. 100 each 2   insulin aspart (FIASP FLEXTOUCH) 100 UNIT/ML FlexTouch Pen Inject 5-8 Units into the skin 3 (three) times daily before meals. 30 mL 3   insulin degludec (TRESIBA FLEXTOUCH) 200 UNIT/ML FlexTouch Pen Inject 20 Units into the skin daily. 30 mL 3   Insulin Pen Needle 32G X 4 MM MISC Use to inject insulin. 200 each 2   Insulin Syringe-Needle U-100 (RELION INSULIN SYRINGE) 31G X 15/64" 0.3 ML MISC Use to inject insulin daily. 100 each 11   ivabradine (CORLANOR) 5 MG TABS tablet Take 1 tablet (5 mg total) by mouth 2 (two) times daily with a meal. 60 tablet 6   levETIRAcetam (KEPPRA) 750 MG tablet Take 2 tablets (1,500 mg total) by mouth 2 (two) times daily. 360 tablet 4   Menthol, Topical Analgesic, (ICY HOT BACK EX) Apply 1 application topically as needed (neuropathy). Icy hot cream     metFORMIN (GLUCOPHAGE-XR) 500 MG 24 hr tablet Take 1 tablet (500 mg total) by mouth 2 (two) times daily with a meal. 180 tablet 3   metoprolol (TOPROL-XL) 200 MG 24 hr tablet TAKE 0.5 TABLETS (100 MG TOTAL) BY MOUTH DAILY. TAKE WITH OR IMMEDIATELY FOLLOWING A MEAL. 30 tablet 2   Oxcarbazepine (TRILEPTAL) 300 MG tablet Take 2 tablets (600 mg total) by mouth 2 (two) times daily. 360 tablet 4   phenytoin (DILANTIN) 100 MG ER capsule Take 1 capsule (100 mg total) by mouth 3 (three) times daily. 270 capsule 4   pregabalin (LYRICA) 300 MG capsule Take 1 capsule (300 mg total) by mouth at bedtime. 30 capsule 5   sacubitril-valsartan (ENTRESTO) 49-51 MG Take 1 tablet by mouth 2 (two) times daily. 60 tablet 3   spironolactone (ALDACTONE) 25 MG tablet Take 1 tablet (25 mg total) by mouth at bedtime. 90 tablet 3   warfarin (COUMADIN) 5 MG tablet Take  1-1.5 tablets (5-7.5 mg total) by mouth daily  as directed by Coumadin clinic 40 tablet 3   No current facility-administered medications for this encounter.   BP (!) 140/100   Pulse 67   Wt 72.6 kg (160 lb)   SpO2 97%   BMI 22.32 kg/m   Wt Readings from Last 3 Encounters:  09/21/21 72.6 kg (160 lb)  09/05/21 74.4 kg (164 lb)  08/27/21 72.6 kg (160 lb)   PHYSICAL EXAM: General: NAD Neck: No JVD, no thyromegaly or thyroid nodule.  Lungs: Clear to auscultation bilaterally with normal respiratory effort. CV: Nondisplaced PMI.  Heart regular S1/S2, no S3/S4, no murmur.  No peripheral edema.  No carotid bruit.  Normal pedal pulses.  Abdomen: Soft, nontender, no hepatosplenomegaly, no distention.  Skin: Intact without lesions or rashes.  Neurologic: Alert and oriented x 3.  Psych: Normal affect. Extremities: No clubbing or cyanosis.  HEENT: Normal.   ASSESSMENT & PLAN:  1. Chronic systolic CHF: Nonischemic cardiomyopathy known for years (since at least 2008).  Cardiac MRI in 2008 with EF 45%, no LGE.  Normal coronary CTA in 2014.  Echo (7/22) with EF 10-15%, LV thrombus, moderate RV dysfunction, dilated IVC. R/LHC (6/22) showed normal filling pressures, preserved cardiac output and no significant CAD. CPX in 9/22 showed severe HF limitation concerning for advanced HF.  Echo in 9/22 showed EF < 20% with mild LV dilation, no LV thrombus noted, normal RV size with mildly decreased systolic function, IVC normal. Medtronic ICD placed 5/23. NYHA class III symptoms chronically, he is not volume overloaded on exam. Ongoing workup at [facility] for heart transplant.  - Continue spironolactone 25 mg qhs.  - Continue Farxiga 5 mg daily (this was started by endocrinology). - Increase Entresto to 49/51 bid and stop Lasix.  BMET today and in 10 days.  - Continue Toprol XL 100 mg daily. - Continue Ivabradine to 5 mg bid.  - Continue digoxin 0.125. Check digoxin level today. - Though cardiac output was  preserved on 6/22 and 3/23 RHCs, CPX in 9/22 showed a severe HF limitation concerning for advanced HF. He has been seen by [facility] for consideration of transplant.  Ongoing workup, needs to do cardiac rehab.  - Start cardiac rehab => will send message to them that he is ready. . 2.  Syncope: Suspect this was orthostatic vs vagal rather than arrhythmic.  No further events. Now has ICD.  3. Diabetes: Insulin-dependent, started in his 11s.  Control better recently but has been poor in past.  - Followed by endocrinology. Now on SGLT2i. 4. 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  - On keppra, dilantin 5. 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  Followed by Neurology. - On Coumadin and statin. 6. LV Thrombus: with associated CVA. Most recent echo in 9/22 did not show a thrombus.  - On Coumadin. No bleeding issues.  He is stable to start Cardiac Rehab. Followup in 6 wks with APP  Loralie Champagne 09/22/2021

## 2021-09-24 ENCOUNTER — Telehealth (HOSPITAL_COMMUNITY): Payer: Self-pay | Admitting: Pharmacy Technician

## 2021-09-24 NOTE — Telephone Encounter (Signed)
Advanced Heart Failure Patient Advocate Encounter  Prior Authorization for Delene Loll has been submitted and approved.    PA# [id number] Effective dates: 09/24/21 through 09/24/22  Charlann Boxer, CPhT

## 2021-09-27 ENCOUNTER — Telehealth (HOSPITAL_COMMUNITY): Payer: Self-pay

## 2021-09-27 NOTE — Telephone Encounter (Signed)
Attempted to reach Mr. Wolden for home paramedicine visit 6/14 and 6/15 but no answer and did not return my call. I will attempt to follow up next week.   Maralyn Sago, EMT-Paramedic [phone] 09/27/2021

## 2021-09-28 ENCOUNTER — Ambulatory Visit (INDEPENDENT_AMBULATORY_CARE_PROVIDER_SITE_OTHER): Payer: Medicaid Other | Admitting: *Deleted

## 2021-09-28 DIAGNOSIS — I513 Intracardiac thrombosis, not elsewhere classified: Secondary | ICD-10-CM

## 2021-09-28 DIAGNOSIS — I639 Cerebral infarction, unspecified: Secondary | ICD-10-CM

## 2021-09-28 DIAGNOSIS — Z7901 Long term (current) use of anticoagulants: Secondary | ICD-10-CM

## 2021-09-28 DIAGNOSIS — Z5181 Encounter for therapeutic drug level monitoring: Secondary | ICD-10-CM | POA: Diagnosis not present

## 2021-09-28 LAB — POCT INR: INR: 2.1 (ref 2.0–3.0)

## 2021-09-28 NOTE — Patient Instructions (Signed)
Description   Continue taking Warfarin 1.5 tablets daily except for 1 tablet on Mondays, Wednesday and Fridays. Recheck INR in 3 weeks. Coumadin Clinic [phone]

## 2021-10-03 ENCOUNTER — Other Ambulatory Visit: Payer: Self-pay | Admitting: Physical Medicine & Rehabilitation

## 2021-10-03 ENCOUNTER — Other Ambulatory Visit: Payer: Self-pay | Admitting: Cardiology

## 2021-10-03 ENCOUNTER — Other Ambulatory Visit: Payer: Self-pay | Admitting: Internal Medicine

## 2021-10-03 ENCOUNTER — Other Ambulatory Visit (HOSPITAL_COMMUNITY): Payer: Self-pay | Admitting: *Deleted

## 2021-10-03 ENCOUNTER — Ambulatory Visit (HOSPITAL_COMMUNITY)
Admission: RE | Admit: 2021-10-03 | Discharge: 2021-10-03 | Disposition: A | Discharge status: Home / Self Care | Payer: Medicaid Other | Source: Ambulatory Visit | Attending: Internal Medicine | Admitting: Internal Medicine

## 2021-10-03 DIAGNOSIS — I5022 Chronic systolic (congestive) heart failure: Secondary | ICD-10-CM | POA: Diagnosis not present

## 2021-10-03 DIAGNOSIS — E1142 Type 2 diabetes mellitus with diabetic polyneuropathy: Secondary | ICD-10-CM

## 2021-10-03 LAB — BASIC METABOLIC PANEL
Anion gap: 10 (ref 5–15)
BUN: 14 mg/dL (ref 6–20)
CO2: 27 mmol/L (ref 22–32)
Calcium: 9.4 mg/dL (ref 8.9–10.3)
Chloride: 104 mmol/L (ref 98–111)
Creatinine, Ser: 0.77 mg/dL (ref 0.61–1.24)
GFR, Estimated: >60 mL/min (ref >60)
Glucose, Bld: 218 mg/dL — ABNORMAL HIGH (ref 70–99)
Potassium: 4.4 mmol/L (ref 3.5–5.1)
Sodium: 141 mmol/L (ref 135–145)

## 2021-10-03 MED ORDER — DAPAGLIFLOZIN PROPANEDIOL 5 MG PO TABS: 5.0000 mg | ORAL_TABLET | Freq: Every day | ORAL | 6 refills | Status: DC

## 2021-10-04 ENCOUNTER — Other Ambulatory Visit (HOSPITAL_COMMUNITY): Payer: Self-pay

## 2021-10-04 NOTE — Progress Notes (Signed)
Paramedicine Encounter    Patient ID: Jason Stevenson, male    DOB: 11/01/1985, 36 y.o.   MRN: [id number]   Arrived for home visit for Jason Stevenson who reports feeling good today with no complaints. He denied shortness of breath, dizziness or chest pain. He says he has been really sleepy over the last few days with increased neuropathy pain. Jason Stevenson also reports several blood sugars over 300 in the last week as he admits he has missed a few doses of insulin and medications. I advised him the importance of taking his medications daily including his insulin and to be monitoring his blood sugar daily. He agrees with plan and says he will get back on track.   He requests bubble packs now so I will confirm med list with [facility] and take access meds to [facility] to have him fill same. I reviewed meds and filled one week of medications in his pill box.   He is approaching week 6 of healing from his ICD implant, he should be cleared for cardiac rehab soon and we plan to follow up for scheduling same.   CBG today no insulin with meal 185   We reviewed upcoming appointments and confirmed same. He agreed to home visit in a few weeks but knows to reach out if needed. I will check in for med rec next week once bubble packs have been delivered.   Home visit complete.   Jason Stevenson, EMT-Paramedic [phone] 10/04/2021    Patient Care Team: Stevenson, Jason B, MD as PCP - General (Internal Medicine) Stevenson, Jason C, MD as PCP - Cardiology (Cardiology) Stevenson, Jason T, MD as PCP - Electrophysiology (Cardiology)  Patient Active Problem List   Diagnosis Date Noted   Nonischemic cardiomyopathy (HCC) 08/27/2021   LADA (latent autoimmune diabetes in adults), managed as type 2 (HCC) 03/28/2021   Poorly controlled type 2 diabetes mellitus with circulatory disorder (HCC) 03/19/2021   Ischemic cardiomyopathy    Syncope 12/14/2020   Chest pain 10/30/2020   History of cerebrovascular accident (CVA) with  residual deficit 10/27/2020   Protein-calorie malnutrition, severe 09/22/2020   Acute CVA (cerebrovascular accident) (HCC) 09/21/2020   Near syncope 09/21/2020   Encounter for monitoring Coumadin therapy 09/20/2020   LV (left ventricular) mural thrombus 09/13/2020   Seizure (HCC) 09/13/2020   Neurological deficit present 09/12/2020   History of COVID-19 09/12/2020   Intracerebral hemorrhage 09/12/2020   Acute cerebrovascular accident (CVA) (HCC) 09/12/2020   COVID-19 08/23/2020   Noncompliance with medication treatment due to intermittent use of medication 10/12/2018   Dyslipidemia 04/17/2016   Chronic systolic CHF (congestive heart failure) (HCC) 06/18/2015   Needs flu shot 12/23/2013   Non-ischemic cardiomyopathy (HCC) 11/06/2012   HTN (hypertension) 11/06/2012   HLD (hyperlipidemia) 11/06/2012    Current Outpatient Medications:    Accu-Chek Softclix Lancets lancets, Use to check blood sugar three times daily., Disp: 100 each, Rfl: 3   acetaminophen (TYLENOL) 325 MG tablet, Take 1-2 tablets (325-650 mg total) by mouth every 4 (four) hours as needed for mild pain., Disp: , Rfl:    atorvastatin (LIPITOR) 80 MG tablet, TAKE 1 TABLET (80 MG TOTAL) BY MOUTH DAILY., Disp: 30 tablet, Rfl: 2   Blood Glucose Monitoring Suppl (ACCU-CHEK GUIDE) w/Device KIT, Use to check blood sugar three times daily., Disp: 1 kit, Rfl: 0   Blood Pressure Monitor DEVI, Use as directed to check home blood pressure 2-3 times a week, Disp: 1 Device, Rfl: 0   Continuous Blood Gluc Receiver (FREESTYLE LIBRE   2 READER) DEVI, 1 each by Does not apply route daily., Disp: 1 each, Rfl: 0   Continuous Blood Gluc Sensor (FREESTYLE LIBRE 2 SENSOR) MISC, USE EVERY 14 (FOURTEEN) DAYS., Disp: , Rfl:    dapagliflozin propanediol (FARXIGA) 5 MG TABS tablet, Take 1 tablet (5 mg total) by mouth daily before breakfast., Disp: 30 tablet, Rfl: 6   digoxin (LANOXIN) 0.125 MG tablet, TAKE 1 TABLET (0.125 MG TOTAL) BY MOUTH DAILY., Disp:  30 tablet, Rfl: 2   DULoxetine (CYMBALTA) 30 MG capsule, TAKE 1 CAPSULE (30 MG TOTAL) BY MOUTH DAILY., Disp: 30 capsule, Rfl: 2   Evolocumab (REPATHA SURECLICK) 140 MG/ML SOAJ, Inject 140 mg into the skin every 14 (fourteen) days., Disp: 2 mL, Rfl: 11   ezetimibe (ZETIA) 10 MG tablet, TAKE 1 TABLET (10 MG TOTAL) BY MOUTH DAILY., Disp: 30 tablet, Rfl: 0   glucose blood (ACCU-CHEK GUIDE) test strip, Use to check blood sugar three times daily., Disp: 100 each, Rfl: 2   insulin aspart (FIASP FLEXTOUCH) 100 UNIT/ML FlexTouch Pen, Inject 5-8 Units into the skin 3 (three) times daily before meals., Disp: 30 mL, Rfl: 3   insulin degludec (TRESIBA FLEXTOUCH) 200 UNIT/ML FlexTouch Pen, Inject 20 Units into the skin daily., Disp: 30 mL, Rfl: 3   Insulin Pen Needle 32G X 4 MM MISC, Use to inject insulin., Disp: 200 each, Rfl: 2   Insulin Syringe-Needle U-100 (RELION INSULIN SYRINGE) 31G X 15/64" 0.3 ML MISC, Use to inject insulin daily., Disp: 100 each, Rfl: 11   ivabradine (CORLANOR) 5 MG TABS tablet, Take 1 tablet (5 mg total) by mouth 2 (two) times daily with a meal., Disp: 60 tablet, Rfl: 6   levETIRAcetam (KEPPRA) 750 MG tablet, Take 2 tablets (1,500 mg total) by mouth 2 (two) times daily., Disp: 360 tablet, Rfl: 4   Menthol, Topical Analgesic, (ICY HOT BACK EX), Apply 1 application topically as needed (neuropathy). Icy hot cream, Disp: , Rfl:    metFORMIN (GLUCOPHAGE-XR) 500 MG 24 hr tablet, Take 1 tablet (500 mg total) by mouth 2 (two) times daily with a meal., Disp: 180 tablet, Rfl: 3   metoprolol (TOPROL-XL) 200 MG 24 hr tablet, TAKE 0.5 TABLETS (100 MG TOTAL) BY MOUTH DAILY. TAKE WITH OR IMMEDIATELY FOLLOWING A MEAL., Disp: 30 tablet, Rfl: 2   Oxcarbazepine (TRILEPTAL) 300 MG tablet, Take 2 tablets (600 mg total) by mouth 2 (two) times daily., Disp: 360 tablet, Rfl: 4   phenytoin (DILANTIN) 100 MG ER capsule, Take 1 capsule (100 mg total) by mouth 3 (three) times daily., Disp: 270 capsule, Rfl: 4    pregabalin (LYRICA) 300 MG capsule, Take 1 capsule (300 mg total) by mouth at bedtime., Disp: 30 capsule, Rfl: 5   sacubitril-valsartan (ENTRESTO) 49-51 MG, Take 1 tablet by mouth 2 (two) times daily., Disp: 60 tablet, Rfl: 3   spironolactone (ALDACTONE) 25 MG tablet, Take 1 tablet (25 mg total) by mouth at bedtime., Disp: 90 tablet, Rfl: 3   warfarin (COUMADIN) 5 MG tablet, Take 1-1.5 tablets (5-7.5 mg total) by mouth daily as directed by Coumadin clinic, Disp: 40 tablet, Rfl: 3 No Known Allergies   Social History   Socioeconomic History   Marital status: Married    Spouse name: [name]   Number of children: 0   Years of education: Not on file   Highest education level: Not on file  Occupational History   Occupation: unemployed  Tobacco Use   Smoking status: Never   Smokeless tobacco: Never    Vaping Use   Vaping Use: Not on file  Substance and Sexual Activity   Alcohol use: Not Currently    Comment: "occasional" when "hanging out with the wrong people" No recent use.   Drug use: Not Currently    Types: Marijuana    Comment: occasional, last 2013   Sexual activity: Not Currently  Other Topics Concern   Not on file  Social History Narrative   Pt lives at home with his wife    Right handed    Caffeine- hardly any   Social Determinants of Health   Financial Resource Strain: High Risk (12/15/2020)   Overall Financial Resource Strain (CARDIA)    Difficulty of Paying Living Expenses: Hard  Food Insecurity: No Food Insecurity (09/14/2020)   Hunger Vital Sign    Worried About Running Out of Food in the Last Year: Never true    Ran Out of Food in the Last Year: Never true  Recent Concern: Food Insecurity - Food Insecurity Present (09/13/2020)   Hunger Vital Sign    Worried About Running Out of Food in the Last Year: Never true    Ran Out of Food in the Last Year: Sometimes true  Transportation Needs: Unmet Transportation Needs (12/15/2020)   PRAPARE - Transportation    Lack of  Transportation (Medical): No    Lack of Transportation (Non-Medical): Yes  Physical Activity: Not on file  Stress: Not on file  Social Connections: Not on file  Intimate Partner Violence: Not on file    Physical Exam      Future Appointments  Date Time Provider Department Center  10/19/2021  8:45 AM [facility]  11/02/2021  9:30 AM [facility] PA/NP [facility] None  11/15/2021  9:20 AM Gherghe, Cristina, MD [facility] None  11/29/2021  7:00 AM [facility] DEVICE REMOTES [facility]  12/05/2021  3:00 PM Stevenson, Jason T, MD [facility]  02/28/2022  7:00 AM [facility] DEVICE REMOTES [facility]  05/30/2022  7:00 AM [facility] DEVICE REMOTES [facility]  07/01/2022  1:30 PM Penumalli, Vikram R, MD [facility] None  08/29/2022  7:00 AM [facility] DEVICE REMOTES [facility]  11/28/2022  7:00 AM [facility] DEVICE REMOTES [facility]     ACTION: Home visit completed       

## 2021-10-08 ENCOUNTER — Encounter: Payer: Self-pay | Admitting: Diagnostic Neuroimaging

## 2021-10-10 ENCOUNTER — Other Ambulatory Visit: Payer: Self-pay | Admitting: Internal Medicine

## 2021-10-10 ENCOUNTER — Other Ambulatory Visit: Payer: Self-pay | Admitting: Physical Medicine & Rehabilitation

## 2021-10-10 DIAGNOSIS — E1142 Type 2 diabetes mellitus with diabetic polyneuropathy: Secondary | ICD-10-CM

## 2021-10-10 NOTE — Telephone Encounter (Signed)
Requested Prescriptions  Pending Prescriptions Disp Refills  . atorvastatin (LIPITOR) 80 MG tablet [Pharmacy Med Name: ATORVASTATIN CALCIUM 80 MG ORAL TABLET] 90 tablet 0    Sig: TAKE 1 TABLET (80 MG TOTAL) BY MOUTH DAILY (EVENING)     Cardiovascular:  Antilipid - Statins Failed - 10/10/2021  1:14 PM      Failed - Lipid Panel in normal range within the last 12 months    Cholesterol, Total  Date Value Ref Range Status  06/08/2021 107 100 - 199 mg/dL Final   Cholesterol  Date Value Ref Range Status  09/21/2021 133 0 - 200 mg/dL Final   LDL Chol Calc (NIH)  Date Value Ref Range Status  06/08/2021 31 0 - 99 mg/dL Final   LDL Cholesterol  Date Value Ref Range Status  09/21/2021 56 0 - 99 mg/dL Final    Comment:           Total Cholesterol/HDL:CHD Risk Coronary Heart Disease Risk Table                     Men   Women  1/2 Average Risk   3.4   3.3  Average Risk       5.0   4.4  2 X Average Risk   9.6   7.1  3 X Average Risk  23.4   11.0        Use the calculated Patient Ratio above and the CHD Risk Table to determine the patient's CHD Risk.        ATP III CLASSIFICATION (LDL):  <100     mg/dL   Optimal  657-846  mg/dL   Near or Above                    Optimal  130-159  mg/dL   Borderline  962-952  mg/dL   High  >841     mg/dL   Very High Performed at [facility], [address], [place], [place] [address]    HDL  Date Value Ref Range Status  09/21/2021 47 >40 mg/dL Final  02/09/2535 49 >64 mg/dL Final   Triglycerides  Date Value Ref Range Status  09/21/2021 149 <150 mg/dL Final         Passed - Patient is not pregnant      Passed - Valid encounter within last 12 months    Recent Outpatient Visits          6 months ago Type 2 diabetes mellitus with diabetic polyneuropathy, with long-term current use of insulin (HCC)   [place] [facility] [place], Jeannett Senior L, RPH-CPP   7 months ago Type 2 diabetes mellitus with diabetic  polyneuropathy, with long-term current use of insulin [facility])   [place] [facility] [place], Jeannett Senior L, RPH-CPP   8 months ago Type 2 diabetes mellitus with diabetic polyneuropathy, with long-term current use of insulin (HCC)   [place] [facility] [name] B, MD   11 months ago Type 2 diabetes mellitus with diabetic polyneuropathy, with long-term current use of insulin [facility])   [place] [facility] [name] B, MD   1 year ago Type 2 diabetes mellitus with diabetic polyneuropathy, with long-term current use of insulin [facility])   [place] [facility] Marcine Matar, MD      Future Appointments  In 8 months Penumalli, Glenford Bayley, MD [facility]

## 2021-10-10 NOTE — Telephone Encounter (Signed)
Pt needs OV, will refill medication for 30 days.  Requested Prescriptions  Pending Prescriptions Disp Refills  . metoprolol (TOPROL-XL) 200 MG 24 hr tablet [Pharmacy Med Name: METOPROLOL SUCCINATE ER 200 MG ORAL TABLET EXTENDED RELEASE 24 HOUR] 30 tablet 0    Sig: TAKE 0.5 TABLETS (100 MG TOTAL) BY MOUTH DAILY. TAKE WITH OR IMMEDIATELY FOLLOWING A MEAL (EVENING)     Cardiovascular:  Beta Blockers Failed - 10/10/2021  1:27 PM      Failed - Valid encounter within last 6 months    Recent Outpatient Visits          6 months ago Type 2 diabetes mellitus with diabetic polyneuropathy, with long-term current use of insulin [facility])   [place] [facility] [place], Jeannett Senior L, RPH-CPP   7 months ago Type 2 diabetes mellitus with diabetic polyneuropathy, with long-term current use of insulin [facility])   [place] [facility] [place], Jeannett Senior L, RPH-CPP   8 months ago Type 2 diabetes mellitus with diabetic polyneuropathy, with long-term current use of insulin (HCC)   [place] [facility] [name] B, MD   11 months ago Type 2 diabetes mellitus with diabetic polyneuropathy, with long-term current use of insulin (HCC)   [place] [facility] [name] B, MD   1 year ago Type 2 diabetes mellitus with diabetic polyneuropathy, with long-term current use of insulin (HCC)   [place] [facility] Marcine Matar, MD      Future Appointments            In 8 months Penumalli, Glenford Bayley, MD [facility]           Passed - Last BP in normal range    BP Readings from Last 1 Encounters:  10/04/21 98/60         Passed - Last Heart Rate in normal range    Pulse Readings from Last 1 Encounters:  10/04/21 (!) 54

## 2021-10-11 ENCOUNTER — Telehealth (HOSPITAL_COMMUNITY): Payer: Self-pay

## 2021-10-11 NOTE — Telephone Encounter (Signed)
Spoke to [facility] who received his bubble packs today from [employer]. I reviewed same via photo confirmation of med list of bubble pack packaging.   -Coumadin will be taken separate from bubble packs. Carroll and his wife have coumadin schedule and are able to do same.   I will follow up in the home on the week of 7/10.   Call complete.   Maralyn Sago, EMT-Paramedic [phone] 10/11/2021

## 2021-10-18 ENCOUNTER — Telehealth (HOSPITAL_COMMUNITY): Payer: Self-pay | Admitting: Licensed Clinical Social Worker

## 2021-10-18 NOTE — Telephone Encounter (Signed)
H&V Care Navigation CSW Progress Note  Clinical Social Worker contacted patient by phone to discuss utility cost concerns.    SDOH Screenings   Alcohol Screen: Not on file  Depression (PHQ2-9): Medium Risk (04/20/2021)   Depression (PHQ2-9)    PHQ-2 Score: 9  Financial Resource Strain: High Risk (10/18/2021)   Overall Financial Resource Strain (CARDIA)    Difficulty of Paying Living Expenses: Hard  Food Insecurity: No Food Insecurity (09/14/2020)   Hunger Vital Sign    Worried About Running Out of Food in the Last Year: Never true    Ran Out of Food in the Last Year: Never true  Recent Concern: Food Insecurity - Food Insecurity Present (09/13/2020)   Hunger Vital Sign    Worried About Running Out of Food in the Last Year: Never true    Ran Out of Food in the Last Year: Sometimes true  Housing: Low Risk  (12/15/2020)   Housing    Last Housing Risk Score: 0  Physical Activity: Not on file  Social Connections: Not on file  Stress: Not on file  Tobacco Use: Low Risk  (09/21/2021)   Patient History    Smoking Tobacco Use: Never    Smokeless Tobacco Use: Never    Passive Exposure: Not on file  Transportation Needs: Unmet Transportation Needs (12/15/2020)   PRAPARE - Transportation    Lack of Transportation (Medical): No    Lack of Transportation (Non-Medical): Yes    Pt requested call from CSW about utility cost concerns.  Reports water has been turned off due to inability to pay- unable to tell CSW how much is owed at this time.  States that water bill seems unnecessarily high given that it is just two of them (is around $120/month) has talked to landlord about this on several occasions to express concerns with water leak.  Unfortunately as water has already been turned off unable to refer to Patient Care Fund assistance.  Instructed pt to go to [facility] to request crisis assistance- informed needs to bring turn off notice, bill with account number, ID, SS card, and proof of income for  household.  Will continue to follow and assist as needed  Burna Sis, LCSW Clinical Social Worker [facility] Desk#: [phone] Cell#: [phone]

## 2021-10-19 ENCOUNTER — Ambulatory Visit (INDEPENDENT_AMBULATORY_CARE_PROVIDER_SITE_OTHER): Payer: Medicaid Other | Admitting: *Deleted

## 2021-10-19 DIAGNOSIS — I513 Intracardiac thrombosis, not elsewhere classified: Secondary | ICD-10-CM

## 2021-10-19 DIAGNOSIS — Z7901 Long term (current) use of anticoagulants: Secondary | ICD-10-CM

## 2021-10-19 DIAGNOSIS — Z5181 Encounter for therapeutic drug level monitoring: Secondary | ICD-10-CM | POA: Diagnosis not present

## 2021-10-19 DIAGNOSIS — I639 Cerebral infarction, unspecified: Secondary | ICD-10-CM

## 2021-10-19 LAB — POCT INR: INR: 1.1 — AB (ref 2.0–3.0)

## 2021-10-19 NOTE — Patient Instructions (Addendum)
Description   Today take 1.5 tablets of warfarin and tomorrow take 2 tablets of warfarin then continue taking Warfarin 1.5 tablets daily except for 1 tablet on Mondays, Wednesday and Fridays. Recheck INR in 1 week. Coumadin Clinic [phone] or [phone]

## 2021-10-24 ENCOUNTER — Other Ambulatory Visit (HOSPITAL_COMMUNITY): Payer: Self-pay

## 2021-10-24 NOTE — Progress Notes (Signed)
Paramedicine Encounter    Patient ID: Jason Stevenson, male    DOB: 1985-09-26, 36 y.o.   MRN: [id number]   Arrived for home visit for Jason Stevenson who reports feeling good today with no complaints. He denied chest pain, shortness of breath, dizziness or swelling. Vitals obtained:   WT- 162lbs BP- 102/62 HR- 67 O2- 96% RR- 16 CBG- 156  No lower leg swelling noted, lungs clear.   Jason Stevenson reports he has been doing well using his bubble packs admitting he has missed a few doses but is getting adjusted. I verified meds in bubble packs and encouraged him to continue to improve med compliance- he agreed. He is taking warfarin out of the bottles but admits to missing a few doses- I suggested placing it in a single row pill box for him and he agreed. I did so- he will follow up with coumadin clinic on Monday.   He is taking insulin correctly.   He is taking Repatha and has one pen left for this Friday's dose. I called [employer] to get shipment for refills set up and they report it will be delivered on Tuesday July 18th for a 90 day supply. They did advise he needs to re-enroll for patient assistance. I will assist with same.   ICD is healing well- no shocks. He reports he did have some beeping but did not call the device clinic last week. I advised him to be sure any time he has a vibration or beep to call the device clinic. He knows if he has any shocks to call them and call 911.    We reviewed appointments and confirmed same.   Home visit complete. I will see Jason Stevenson in two weeks.   Jason Stevenson, Jason Stevenson 10/24/2021     Patient Care Team: Ladell Pier, MD as PCP - General (Internal Medicine) Josue Hector, MD as PCP - Cardiology (Cardiology) Vickie Epley, MD as PCP - Electrophysiology (Cardiology)  Patient Active Problem List   Diagnosis Date Noted   Nonischemic cardiomyopathy [facility]) 08/27/2021   LADA (latent autoimmune diabetes in adults), managed as type  2 ([place]) 03/28/2021   Poorly controlled type 2 diabetes mellitus with circulatory disorder ([place]) 03/19/2021   Ischemic cardiomyopathy    Syncope 12/14/2020   Chest pain 10/30/2020   History of cerebrovascular accident (CVA) with residual deficit 10/27/2020   Protein-calorie malnutrition, severe 09/22/2020   Acute CVA (cerebrovascular accident) ([place]) 09/21/2020   Near syncope 09/21/2020   Encounter for monitoring Coumadin therapy 09/20/2020   LV (left ventricular) mural thrombus 09/13/2020   Seizure ([place]) 09/13/2020   Neurological deficit present 09/12/2020   History of COVID-19 09/12/2020   Intracerebral hemorrhage 09/12/2020   Acute cerebrovascular accident (CVA) ([place]) 09/12/2020   COVID-19 08/23/2020   Noncompliance with medication treatment due to intermittent use of medication 10/12/2018   Dyslipidemia 93/73/4287   Chronic systolic CHF (congestive heart failure) ([place]) 06/18/2015   Needs flu shot 12/23/2013   Non-ischemic cardiomyopathy ([place]) 11/06/2012   HTN (hypertension) 11/06/2012   HLD (hyperlipidemia) 11/06/2012    Current Outpatient Medications:    Accu-Chek Softclix Lancets lancets, Use to check blood sugar three times daily., Disp: 100 each, Rfl: 3   acetaminophen (TYLENOL) 325 MG tablet, Take 1-2 tablets (325-650 mg total) by mouth every 4 (four) hours as needed for mild pain., Disp: , Rfl:    atorvastatin (LIPITOR) 80 MG tablet, TAKE 1 TABLET (80 MG TOTAL) BY MOUTH DAILY (EVENING), Disp: 90 tablet, Rfl: 0  Blood Glucose Monitoring Suppl (ACCU-CHEK GUIDE) w/Device KIT, Use to check blood sugar three times daily., Disp: 1 kit, Rfl: 0   Blood Pressure Monitor DEVI, Use as directed to check home blood pressure 2-3 times a week, Disp: 1 Device, Rfl: 0   Continuous Blood Gluc Receiver (FREESTYLE LIBRE 2 READER) DEVI, 1 each by Does not apply route daily., Disp: 1 each, Rfl: 0   Continuous Blood Gluc Sensor (FREESTYLE LIBRE 2 SENSOR) MISC, USE EVERY 14 (FOURTEEN) DAYS., Disp:  , Rfl:    dapagliflozin propanediol (FARXIGA) 5 MG TABS tablet, Take 1 tablet (5 mg total) by mouth daily before breakfast., Disp: 30 tablet, Rfl: 6   digoxin (LANOXIN) 0.125 MG tablet, TAKE 1 TABLET (0.125 MG TOTAL) BY MOUTH DAILY., Disp: 30 tablet, Rfl: 2   DULoxetine (CYMBALTA) 30 MG capsule, TAKE 1 CAPSULE (30 MG TOTAL) BY MOUTH DAILY., Disp: 30 capsule, Rfl: 2   Evolocumab (REPATHA SURECLICK) 295 MG/ML SOAJ, Inject 140 mg into the skin every 14 (fourteen) days., Disp: 2 mL, Rfl: 11   ezetimibe (ZETIA) 10 MG tablet, TAKE 1 TABLET (10 MG TOTAL) BY MOUTH DAILY., Disp: 30 tablet, Rfl: 0   glucose blood (ACCU-CHEK GUIDE) test strip, Use to check blood sugar three times daily., Disp: 100 each, Rfl: 2   insulin aspart (FIASP FLEXTOUCH) 100 UNIT/ML FlexTouch Pen, Inject 5-8 Units into the skin 3 (three) times daily before meals., Disp: 30 mL, Rfl: 3   insulin degludec (TRESIBA FLEXTOUCH) 200 UNIT/ML FlexTouch Pen, Inject 20 Units into the skin daily., Disp: 30 mL, Rfl: 3   Insulin Pen Needle 32G X 4 MM MISC, Use to inject insulin., Disp: 200 each, Rfl: 2   Insulin Syringe-Needle U-100 (RELION INSULIN SYRINGE) 31G X 15/64" 0.3 ML MISC, Use to inject insulin daily., Disp: 100 each, Rfl: 11   ivabradine (CORLANOR) 5 MG TABS tablet, Take 1 tablet (5 mg total) by mouth 2 (two) times daily with a meal., Disp: 60 tablet, Rfl: 6   levETIRAcetam (KEPPRA) 750 MG tablet, Take 2 tablets (1,500 mg total) by mouth 2 (two) times daily., Disp: 360 tablet, Rfl: 4   Menthol, Topical Analgesic, (ICY HOT BACK EX), Apply 1 application topically as needed (neuropathy). Icy hot cream, Disp: , Rfl:    metFORMIN (GLUCOPHAGE-XR) 500 MG 24 hr tablet, Take 1 tablet (500 mg total) by mouth 2 (two) times daily with a meal., Disp: 180 tablet, Rfl: 3   metoprolol (TOPROL-XL) 200 MG 24 hr tablet, TAKE 0.5 TABLETS (100 MG TOTAL) BY MOUTH DAILY. TAKE WITH OR IMMEDIATELY FOLLOWING A MEAL (EVENING), Disp: 30 tablet, Rfl: 0   Oxcarbazepine  (TRILEPTAL) 300 MG tablet, Take 2 tablets (600 mg total) by mouth 2 (two) times daily., Disp: 360 tablet, Rfl: 4   phenytoin (DILANTIN) 100 MG ER capsule, Take 1 capsule (100 mg total) by mouth 3 (three) times daily., Disp: 270 capsule, Rfl: 4   pregabalin (LYRICA) 300 MG capsule, TAKE 1 CAPSULE (300 MG TOTAL) BY MOUTH AT BEDTIME., Disp: 30 capsule, Rfl: 2   sacubitril-valsartan (ENTRESTO) 49-51 MG, Take 1 tablet by mouth 2 (two) times daily., Disp: 60 tablet, Rfl: 3   spironolactone (ALDACTONE) 25 MG tablet, Take 1 tablet (25 mg total) by mouth at bedtime., Disp: 90 tablet, Rfl: 3   warfarin (COUMADIN) 5 MG tablet, Take 1-1.5 tablets (5-7.5 mg total) by mouth daily as directed by Coumadin clinic, Disp: 40 tablet, Rfl: 3 No Known Allergies   Social History   Socioeconomic History  Marital status: Married    Spouse name: [name]   Number of children: 0   Years of education: Not on file   Highest education level: Not on file  Occupational History   Occupation: unemployed  Tobacco Use   Smoking status: Never   Smokeless tobacco: Never  Vaping Use   Vaping Use: Not on file  Substance and Sexual Activity   Alcohol use: Not Currently    Comment: "occasional" when "hanging out with the wrong people" No recent use.   Drug use: Not Currently    Types: Marijuana    Comment: occasional, last 2013   Sexual activity: Not Currently  Other Topics Concern   Not on file  Social History Narrative   Pt lives at home with his wife    Right handed    Caffeine- hardly any   Social Determinants of Health   Financial Resource Strain: High Risk (10/18/2021)   Overall Financial Resource Strain (CARDIA)    Difficulty of Paying Living Expenses: Hard  Food Insecurity: No Food Insecurity (09/14/2020)   Hunger Vital Sign    Worried About Running Out of Food in the Last Year: Never true    Ran Out of Food in the Last Year: Never true  Recent Concern: Food Insecurity - Food Insecurity Present (09/13/2020)    Hunger Vital Sign    Worried About Running Out of Food in the Last Year: Never true    Ran Out of Food in the Last Year: Sometimes true  Transportation Needs: Unmet Transportation Needs (12/15/2020)   PRAPARE - Hydrologist (Medical): No    Lack of Transportation (Non-Medical): Yes  Physical Activity: Not on file  Stress: Not on file  Social Connections: Not on file  Intimate Partner Violence: Not on file    Physical Exam      Future Appointments  Date Time Provider [place]  10/29/2021  9:00 AM [facility] [place] [facility]  11/02/2021  9:30 AM [facility] PA/NP [facility] None  11/15/2021  9:20 AM Philemon Kingdom, MD [facility] None  11/29/2021  7:00 AM [facility] DEVICE REMOTES [facility]  12/05/2021  3:00 PM Vickie Epley, MD [facility]  02/28/2022  7:00 AM [facility] DEVICE REMOTES [facility]  05/30/2022  7:00 AM [facility] DEVICE REMOTES [facility]  07/01/2022  1:30 PM Penumalli, Earlean Polka, MD [facility] None  08/29/2022  7:00 AM [facility] DEVICE REMOTES [facility]  11/28/2022  7:00 AM [facility] DEVICE REMOTES [facility]     ACTION: Home visit completed

## 2021-10-29 ENCOUNTER — Ambulatory Visit (INDEPENDENT_AMBULATORY_CARE_PROVIDER_SITE_OTHER): Payer: Medicaid Other

## 2021-10-29 DIAGNOSIS — Z7901 Long term (current) use of anticoagulants: Secondary | ICD-10-CM

## 2021-10-29 DIAGNOSIS — I513 Intracardiac thrombosis, not elsewhere classified: Secondary | ICD-10-CM

## 2021-10-29 DIAGNOSIS — I639 Cerebral infarction, unspecified: Secondary | ICD-10-CM

## 2021-10-29 DIAGNOSIS — Z5181 Encounter for therapeutic drug level monitoring: Secondary | ICD-10-CM

## 2021-10-29 LAB — POCT INR: INR: 3.5 — AB (ref 2.0–3.0)

## 2021-10-29 NOTE — Patient Instructions (Signed)
continue taking Warfarin 1.5 tablets daily except for 1 tablet on Mondays, Wednesday and Fridays. Eat greens tonight and Tuesday. Recheck INR in 3 weeks. Coumadin Clinic [phone] or [phone]

## 2021-10-31 ENCOUNTER — Telehealth (HOSPITAL_COMMUNITY): Payer: Self-pay | Admitting: Licensed Clinical Social Worker

## 2021-10-31 NOTE — Telephone Encounter (Signed)
HF Paramedicine Team Based Care Meeting  HF MD- NA  HF NP - Amy Clegg NP-C   [facility] HF Paramedicine  Katie Vicente Males  [facility] admit within the last 30 days for heart failure? no  Medications concerns? On bubble packs with [place] which is going well- on coumadin outside of packs but he is able to manage it effectively  SDOH -pending disability  Eligible for discharge? Might be eligible for DC soon due to good progress and no current medical concerns.  Burna Sis, LCSW Clinical Social Worker [facility] Desk#: [phone] Cell#: [phone]

## 2021-11-02 ENCOUNTER — Ambulatory Visit (HOSPITAL_COMMUNITY)
Admission: RE | Admit: 2021-11-02 | Discharge: 2021-11-02 | Disposition: A | Discharge status: Home / Self Care | Payer: Medicaid Other | Source: Ambulatory Visit | Attending: Family Medicine | Admitting: Family Medicine

## 2021-11-02 ENCOUNTER — Encounter (HOSPITAL_COMMUNITY): Payer: Self-pay

## 2021-11-02 ENCOUNTER — Telehealth (HOSPITAL_COMMUNITY): Payer: Self-pay

## 2021-11-02 VITALS — BP 102/70 | HR 58 | Wt 158.8 lb

## 2021-11-02 DIAGNOSIS — I5022 Chronic systolic (congestive) heart failure: Secondary | ICD-10-CM | POA: Insufficient documentation

## 2021-11-02 DIAGNOSIS — G40909 Epilepsy, unspecified, not intractable, without status epilepticus: Secondary | ICD-10-CM | POA: Diagnosis not present

## 2021-11-02 DIAGNOSIS — E1042 Type 1 diabetes mellitus with diabetic polyneuropathy: Secondary | ICD-10-CM | POA: Diagnosis not present

## 2021-11-02 DIAGNOSIS — I110 Hypertensive heart disease with heart failure: Secondary | ICD-10-CM | POA: Insufficient documentation

## 2021-11-02 DIAGNOSIS — Z8616 Personal history of COVID-19: Secondary | ICD-10-CM | POA: Diagnosis not present

## 2021-11-02 DIAGNOSIS — Z794 Long term (current) use of insulin: Secondary | ICD-10-CM | POA: Diagnosis not present

## 2021-11-02 DIAGNOSIS — Z86718 Personal history of other venous thrombosis and embolism: Secondary | ICD-10-CM | POA: Insufficient documentation

## 2021-11-02 DIAGNOSIS — Z79899 Other long term (current) drug therapy: Secondary | ICD-10-CM | POA: Diagnosis not present

## 2021-11-02 DIAGNOSIS — Z87898 Personal history of other specified conditions: Secondary | ICD-10-CM | POA: Diagnosis not present

## 2021-11-02 DIAGNOSIS — R42 Dizziness and giddiness: Secondary | ICD-10-CM | POA: Diagnosis not present

## 2021-11-02 DIAGNOSIS — R569 Unspecified convulsions: Secondary | ICD-10-CM | POA: Diagnosis not present

## 2021-11-02 DIAGNOSIS — Z7901 Long term (current) use of anticoagulants: Secondary | ICD-10-CM | POA: Diagnosis not present

## 2021-11-02 DIAGNOSIS — I513 Intracardiac thrombosis, not elsewhere classified: Secondary | ICD-10-CM | POA: Diagnosis not present

## 2021-11-02 DIAGNOSIS — E785 Hyperlipidemia, unspecified: Secondary | ICD-10-CM | POA: Insufficient documentation

## 2021-11-02 DIAGNOSIS — I428 Other cardiomyopathies: Secondary | ICD-10-CM | POA: Insufficient documentation

## 2021-11-02 DIAGNOSIS — R002 Palpitations: Secondary | ICD-10-CM | POA: Insufficient documentation

## 2021-11-02 DIAGNOSIS — Z7984 Long term (current) use of oral hypoglycemic drugs: Secondary | ICD-10-CM | POA: Insufficient documentation

## 2021-11-02 DIAGNOSIS — E1165 Type 2 diabetes mellitus with hyperglycemia: Secondary | ICD-10-CM

## 2021-11-02 DIAGNOSIS — Z8673 Personal history of transient ischemic attack (TIA), and cerebral infarction without residual deficits: Secondary | ICD-10-CM | POA: Insufficient documentation

## 2021-11-02 DIAGNOSIS — R0602 Shortness of breath: Secondary | ICD-10-CM | POA: Insufficient documentation

## 2021-11-02 DIAGNOSIS — R55 Syncope and collapse: Secondary | ICD-10-CM | POA: Insufficient documentation

## 2021-11-02 DIAGNOSIS — I639 Cerebral infarction, unspecified: Secondary | ICD-10-CM | POA: Diagnosis not present

## 2021-11-02 LAB — BASIC METABOLIC PANEL
Anion gap: 7 (ref 5–15)
BUN: 7 mg/dL (ref 6–20)
CO2: 27 mmol/L (ref 22–32)
Calcium: 8.6 mg/dL — ABNORMAL LOW (ref 8.9–10.3)
Chloride: 107 mmol/L (ref 98–111)
Creatinine, Ser: 0.77 mg/dL (ref 0.61–1.24)
GFR, Estimated: >60 mL/min (ref >60)
Glucose, Bld: 133 mg/dL — ABNORMAL HIGH (ref 70–99)
Potassium: 4 mmol/L (ref 3.5–5.1)
Sodium: 141 mmol/L (ref 135–145)

## 2021-11-02 LAB — DIGOXIN LEVEL: Digoxin Level: 0.5 ng/mL — ABNORMAL LOW (ref 0.8–2.0)

## 2021-11-02 MED ORDER — POTASSIUM CHLORIDE CRYS ER 20 MEQ PO TBCR: 20.0000 meq | EXTENDED_RELEASE_TABLET | ORAL | 4 refills | Status: DC

## 2021-11-02 MED ORDER — FUROSEMIDE 20 MG PO TABS: 20.0000 mg | ORAL_TABLET | ORAL | 11 refills | Status: DC

## 2021-11-02 NOTE — Patient Instructions (Signed)
RESTART Lasix 20 mg one tab three times weekly (Mondays, Wednesdays, and Fridays)  Labs today We will only contact you if something comes back abnormal or we need to make some changes. Otherwise no news is good news!]  Labs needed in 2 weeks  Your physician wants you to follow-up in: 3 months with Dr Earlean Shawl will receive a reminder letter in the mail two months in advance. If you don't receive a letter, please call our office to schedule the follow-up appointment.   Do the following things EVERYDAY: Weigh yourself in the morning before breakfast. Write it down and keep it in a log. Take your medicines as prescribed Eat low salt foods--Limit salt (sodium) to 2000 mg per day.  Stay as active as you can everyday Limit all fluids for the day to less than 2 liters  At the [facility], you and your health needs are our priority. As part of our continuing mission to provide you with exceptional heart care, we have created designated Provider Care Teams. These Care Teams include your primary Cardiologist (physician) and Advanced Practice Providers (APPs- Physician Assistants and Nurse Practitioners) who all work together to provide you with the care you need, when you need it.   You may see any of the following providers on your designated Care Team at your next follow up: Dr Arvilla Meres Dr Carron Curie, NP Robbie Lis, [place] [facility] [place], [place] Karle Plumber, PharmD   Please be sure to bring in all your medications bottles to every appointment.

## 2021-11-02 NOTE — Progress Notes (Signed)
PCP: Dr. Karle Plumber HF Cardiologist: Dr. Aundra Dubin  HPI: 36 y.o.male with diabetes, HTN, and a long history of nonischemic cardiomyopathy. He had a cardiac MRI in 1/08 showing low EF, but he says that he had been told about "heart problems" even prior to that.  He does not have a family history of cardiomyopathy that he knows of, but does not know his father's family.  Coronary CT angiogram in 2014 showed no CAD. Echo in 2/17 showed EF 20-25%, similar to 2014. Had repeat echo in 2020 showing LVEF 15-20%, RV normal. He had COVID-19 in 4/22.   He presented to [facility] ED 09/11/20 with tingling in left arm, left face, drooling and blank stare for about 1 week, and admitted for possible seizure, HHS and possible CVA.  In ED, he had left-sided neurologic deficit with dysarthria.  CT head with probable subacute infarct in right frontal lobe and old right parietal infarct.  Reportedly, patient's neurologic deficits resolved in ED, and focus shifted to possible seizure versus stroke.  He was loaded with Keppra.  MRI brain with probable hemorrhagic infarct involving the anterior right frontal lobe and left parietal occipital cortex with additional chronic right parietal infarct, right frontotemporal region enhancement suggesting changes related to acute seizure, possible toxic insult involving the caudate and lentiform nuclei bilaterally.  Neurology recommended interval follow-up brain MRI.  CTA head and neck without significant finding.  TTE with EF of 10-15%, LV thrombus, moderate RV dysfunction and dilated IVC.  He was discharged on GDMT, but A1c too high for SGLT2i.    Seen in [facility] ED 09/21/20 for fall after syncopal episode on toilet, hitting his head. CT of the head was significant for no bleeding but new low-density left cerebellum suggesting acute infarct. EEG unremarkable and felt fall secondary to vagal episode not seizure.  He had atypical chest pain at his hospital follow up. With his history of HLD and DM,  decisions was made to undergo [facility] to evaluate coronary anatomy and hemodynamics.  R/LHC (7/22) showed normal filling pressures, preserved cardiac output and no significant CAD. RA mean 1, RV 28/1, PA 19/7 mean 12, PCWP 3, CO/CI 5.54/2.92.  Again admitted 12/14/20-12/16/20 for syncopal episode. CT head negative. He was given IVF, beta blocker/spiro/Entresto held initially. Suspected orthostatic vs micturition syncope. EP consulted with known low EF and plans for ICD implant when a1c better controlled.  CPX in 9/22 showed severe HF limitation. Echo was done today and reviewed, EF < 20% with mild LV dilation, no LV thrombus noted, normal RV size with mildly decreased systolic function, IVC normal.   Followup 11/22, weight up about 10 lbs.  Patient had a seizure thought to be related to hypoglycemia (paramedics called, improved with glucose). Lasix 20 mg started every other day, eventually increased to daily.   [place] 3/23 at [facility] as part of pre-transplant work up showed normal pressures, RA 7, RV 23/4, PA 14, PCWP 11, CI 3.5.  Patient is currently being worked up for transplant listing.  Needs to do cardiac rehab.   Medtronic ICD placed 5/23.   Follow up 6/23, chronic NYHA III symptoms and volume stable. Ongoing work up at [facility] for transplant. Entresto increased to 49/51 and daily Lasix stopped.   Today he returns for HF follow up. Overall feeling fine. He remains SOB walking short distances on flat ground. Left shoulder remains sore from ICD insertion. Ongoing work up at [facility] for transplant. Feels occasional palpitations and dizziness, no falls. Denies abnormal bleeding, CP, edema,  or PND/Orthopnea. Appetite ok. No fever or chills. Weight at home stable on paramedic checks Taking all medications.   MDT ICD interrogation (personally reviewed):OptiVol slowly rising, thoracic impedence stable, no VT, < 1 hr/day activity  Labs (9/22): K 3.5, creatinine 0.85, digoxin 0.4 Labs (10/22): K 4.1, creatinine 0.8,  digoxin 0.9 Labs (3/23): K 4.5, creatinine 0.9 Labs (4/23): K 4.6, creatinine 0.89 Labs (6/23): K 4.4, creatinine 0.77  ECG (personally reviewed): none ordered today.  ROS: All systems negative except as listed in HPI, PMH and Problem List.  PMH:  1. Type 1 diabetes: Generally poorly controlled.  2. 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  3. H/o LV thrombus 4. Syncope: Vagal vs orthostatic, likely not arrhythmic.  5. Diabetic polyneuropathy.  6. Chronic systolic CHF: Nonischemic cardiomyopathy.  Long-standing.  - Cardiac MRI (1/08): EF 45%, no late gadolinium enhancement.  - Coronary CTA (7/14): No significant CAD - Echo (7/14): EF 20-25%, severe LV dilation.  - Echo (6/22): EF 10-15%, LV thrombus, mild LV dilation, moderately decreased RV systolic function with normal size, mild MR, dilated IVC.  - LHC/RHC (7/22): No CAD; mean RA 1, PA 19/7, mean PCWP 3, CI 2.92.  - Echo (10/22): EF < 20%, mild LV dilation, normal RV size with mildly decreased systolic function, IVC normal.  - CPX (9/22): peak VO2 10.4, VE/VCO2 slope 26, RER 1.22 => severe HF limitation.  - RHC (3/23) at [facility] RA 7, RV 23/4, PA 14, PCWP 11, CI 3.5 - Medtronic ICD placed 5/23.  7. 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   SH:  Social History   Socioeconomic History   Marital status: Married    Spouse name: [name]   Number of children: 0   Years of education: Not on file   Highest education level: Not on file  Occupational History   Occupation: unemployed  Tobacco Use   Smoking status: Never   Smokeless tobacco: Never  Vaping Use   Vaping Use: Not on file  Substance and Sexual Activity   Alcohol use: Not Currently    Comment: "occasional" when "hanging out with the wrong people" No recent use.   Drug use: Not Currently    Types: Marijuana    Comment: occasional, last 2013   Sexual activity: Not Currently  Other Topics Concern   Not on file  Social History  Narrative   Pt lives at home with his wife    Right handed    Caffeine- hardly any   Social Determinants of Health   Financial Resource Strain: High Risk (10/18/2021)   Overall Financial Resource Strain (CARDIA)    Difficulty of Paying Living Expenses: Hard  Food Insecurity: No Food Insecurity (09/14/2020)   Hunger Vital Sign    Worried About Running Out of Food in the Last Year: Never true    Ran Out of Food in the Last Year: Never true  Recent Concern: Food Insecurity - Food Insecurity Present (09/13/2020)   Hunger Vital Sign    Worried About Running Out of Food in the Last Year: Never true    Ran Out of Food in the Last Year: Sometimes true  Transportation Needs: Unmet Transportation Needs (12/15/2020)   PRAPARE - Hydrologist (Medical): No    Lack of Transportation (Non-Medical): Yes  Physical Activity: Not on file  Stress: Not on file  Social Connections: Not on file  Intimate Partner Violence: Not on file   FH:  Family History  Problem Relation Age of Onset   Stroke Mother    Diabetes Mother    Hypertension Mother    Stroke Maternal Aunt    Heart attack Neg Hx     Current Outpatient Medications  Medication Sig Dispense Refill   Accu-Chek Softclix Lancets lancets Use to check blood sugar three times daily. 100 each 3   acetaminophen (TYLENOL) 325 MG tablet Take 1-2 tablets (325-650 mg total) by mouth every 4 (four) hours as needed for mild pain.     atorvastatin (LIPITOR) 80 MG tablet TAKE 1 TABLET (80 MG TOTAL) BY MOUTH DAILY (EVENING) 90 tablet 0   Blood Glucose Monitoring Suppl (ACCU-CHEK GUIDE) w/Device KIT Use to check blood sugar three times daily. 1 kit 0   Blood Pressure Monitor DEVI Use as directed to check home blood pressure 2-3 times a week 1 Device 0   Continuous Blood Gluc Receiver (FREESTYLE LIBRE 2 READER) DEVI 1 each by Does not apply route daily. 1 each 0   Continuous Blood Gluc Sensor (FREESTYLE LIBRE 2 SENSOR) MISC USE EVERY 14  (FOURTEEN) DAYS.     dapagliflozin propanediol (FARXIGA) 5 MG TABS tablet Take 1 tablet (5 mg total) by mouth daily before breakfast. 30 tablet 6   digoxin (LANOXIN) 0.125 MG tablet TAKE 1 TABLET (0.125 MG TOTAL) BY MOUTH DAILY. 30 tablet 2   DULoxetine (CYMBALTA) 30 MG capsule TAKE 1 CAPSULE (30 MG TOTAL) BY MOUTH DAILY. 30 capsule 2   Evolocumab (REPATHA SURECLICK) 256 MG/ML SOAJ Inject 140 mg into the skin every 14 (fourteen) days. 2 mL 11   ezetimibe (ZETIA) 10 MG tablet TAKE 1 TABLET (10 MG TOTAL) BY MOUTH DAILY. 30 tablet 0   glucose blood (ACCU-CHEK GUIDE) test strip Use to check blood sugar three times daily. 100 each 2   insulin aspart (FIASP FLEXTOUCH) 100 UNIT/ML FlexTouch Pen Inject 5-8 Units into the skin 3 (three) times daily before meals. 30 mL 3   insulin degludec (TRESIBA FLEXTOUCH) 200 UNIT/ML FlexTouch Pen Inject 20 Units into the skin daily. 30 mL 3   Insulin Pen Needle 32G X 4 MM MISC Use to inject insulin. 200 each 2   Insulin Syringe-Needle U-100 (RELION INSULIN SYRINGE) 31G X 15/64" 0.3 ML MISC Use to inject insulin daily. 100 each 11   ivabradine (CORLANOR) 5 MG TABS tablet Take 1 tablet (5 mg total) by mouth 2 (two) times daily with a meal. 60 tablet 6   levETIRAcetam (KEPPRA) 750 MG tablet Take 2 tablets (1,500 mg total) by mouth 2 (two) times daily. 360 tablet 4   Menthol, Topical Analgesic, (ICY HOT BACK EX) Apply 1 application topically as needed (neuropathy). Icy hot cream     metFORMIN (GLUCOPHAGE-XR) 500 MG 24 hr tablet Take 1 tablet (500 mg total) by mouth 2 (two) times daily with a meal. 180 tablet 3   metoprolol (TOPROL-XL) 200 MG 24 hr tablet TAKE 0.5 TABLETS (100 MG TOTAL) BY MOUTH DAILY. TAKE WITH OR IMMEDIATELY FOLLOWING A MEAL (EVENING) 30 tablet 0   Oxcarbazepine (TRILEPTAL) 300 MG tablet Take 2 tablets (600 mg total) by mouth 2 (two) times daily. 360 tablet 4   phenytoin (DILANTIN) 100 MG ER capsule Take 1 capsule (100 mg total) by mouth 3 (three) times  daily. 270 capsule 4   pregabalin (LYRICA) 300 MG capsule TAKE 1 CAPSULE (300 MG TOTAL) BY MOUTH AT BEDTIME. 30 capsule 2   sacubitril-valsartan (ENTRESTO) 49-51 MG Take 1 tablet by mouth 2 (  two) times daily. 60 tablet 3   spironolactone (ALDACTONE) 25 MG tablet Take 1 tablet (25 mg total) by mouth at bedtime. 90 tablet 3   warfarin (COUMADIN) 5 MG tablet Take 1-1.5 tablets (5-7.5 mg total) by mouth daily as directed by Coumadin clinic 40 tablet 3   No current facility-administered medications for this encounter.   BP 102/70   Pulse (!) 58   Wt 72 kg (158 lb 12.8 oz)   SpO2 98%   BMI 22.15 kg/m   Wt Readings from Last 3 Encounters:  11/02/21 72 kg (158 lb 12.8 oz)  10/24/21 73.5 kg (162 lb)  10/04/21 72.5 kg (159 lb 12.8 oz)   PHYSICAL EXAM: General:  NAD. No resp difficulty, thin HEENT: Normal Neck: Supple. No JVD. Carotids 2+ bilat; no bruits. No lymphadenopathy or thryomegaly appreciated. Cor: PMI nondisplaced. Regular rate & rhythm. No rubs, gallops or murmurs. Lungs: Clear Abdomen: Soft, nontender, nondistended. No hepatosplenomegaly. No bruits or masses. Good bowel sounds. Extremities: No cyanosis, clubbing, rash, edema Neuro: Alert & oriented x 3, cranial nerves grossly intact. Moves all 4 extremities w/o difficulty. Affect pleasant.  ASSESSMENT & PLAN:  1. Chronic systolic CHF: Nonischemic cardiomyopathy known for years (since at least 2008).  Cardiac MRI in 2008 with EF 45%, no LGE.  Normal coronary CTA in 2014.  Echo (7/22) with EF 10-15%, LV thrombus, moderate RV dysfunction, dilated IVC. R/LHC (6/22) showed normal filling pressures, preserved cardiac output and no significant CAD. CPX in 9/22 showed severe HF limitation concerning for advanced HF.  Echo in 9/22 showed EF < 20% with mild LV dilation, no LV thrombus noted, normal RV size with mildly decreased systolic function, IVC normal. Medtronic ICD placed 5/23. NYHA class III symptoms chronically, OptiVol up. Ongoing  workup at [facility] for heart transplant.  - Restart Lasix 20 mg on MWF. BMET today, repeat in 2 weeks. I will ask Sharman Cheek to send transmission in 7-10 days. - Continue spironolactone 25 mg qhs.  - Continue Farxiga 5 mg daily (this was started by endocrinology). - Continue Entresto 49/51 bid.  - Continue Toprol XL 100 mg daily. - Continue Ivabradine 5 mg bid.  - Continue digoxin 0.125. Check dig level today. - Though cardiac output was preserved on 6/22 and 3/23 RHCs, CPX in 9/22 showed a severe HF limitation concerning for advanced HF. He has been seen by [facility] for consideration of transplant.  Ongoing workup, needs to do cardiac rehab.  - He has been cleared to start cardiac rehab => will send message to them to schedule. 2.  Syncope: Suspect this was orthostatic vs vagal rather than arrhythmic.  No further events. Now has ICD.  3. Diabetes: Insulin-dependent, started in his 44s.  Control better recently but has been poor in past.  - Followed by endocrinology. Now on SGLT2i. 4. 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  - On keppra, dilantin 5. 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  Followed by Neurology. - On Coumadin and statin. 6. LV Thrombus: with associated CVA. Most recent echo in 9/22 did not show a thrombus.  - On Coumadin. No bleeding issues.  Follow up in 3 months with Dr. Aundra Dubin.  Maricela Bo [facility] FNP-BC 11/02/2021

## 2021-11-02 NOTE — Telephone Encounter (Addendum)
Pt aware, agreeable, and verbalized understanding  Rx sent to pharmacy  ----- Message from Jacklynn Ganong, FNP sent at 11/02/2021 12:13 PM EDT ----- Labs ok. Please have him take 20 KCL on days he takes Lasix (MWF)

## 2021-11-07 ENCOUNTER — Encounter: Payer: Self-pay | Admitting: Internal Medicine

## 2021-11-07 ENCOUNTER — Other Ambulatory Visit: Payer: Self-pay | Admitting: Internal Medicine

## 2021-11-07 ENCOUNTER — Telehealth (HOSPITAL_COMMUNITY): Payer: Self-pay

## 2021-11-07 DIAGNOSIS — E139 Other specified diabetes mellitus without complications: Secondary | ICD-10-CM

## 2021-11-07 MED ORDER — FREESTYLE LIBRE 2 SENSOR MISC: 1 refills | Status: DC

## 2021-11-07 NOTE — Telephone Encounter (Signed)
Received a phone call from [facility] reporting that they are needing a Prior Authorization for his insurance in order to fill the [employer] for him. The pharmacy did confirm they received the refills but need a PA for them to fill and deliver to the patient. I will route this call to his Endocrinologist for follow up. Call complete.   Maralyn Sago, EMT-Paramedic [phone] 11/07/2021

## 2021-11-08 ENCOUNTER — Other Ambulatory Visit (HOSPITAL_COMMUNITY): Payer: Self-pay

## 2021-11-08 ENCOUNTER — Telehealth: Payer: Self-pay | Admitting: Pharmacist

## 2021-11-08 ENCOUNTER — Other Ambulatory Visit: Payer: Self-pay | Admitting: Internal Medicine

## 2021-11-08 DIAGNOSIS — E139 Other specified diabetes mellitus without complications: Secondary | ICD-10-CM

## 2021-11-08 MED ORDER — FREESTYLE LIBRE 2 SENSOR MISC
6 refills | Status: DC
  Filled 2021-11-08 – 2021-11-14 (×2): qty 2, 28d supply, fill #0

## 2021-11-08 NOTE — Progress Notes (Signed)
Paramedicine Encounter    Patient ID: Jason Stevenson, male    DOB: 02/11/1986, 36 y.o.   MRN: [id number]  Arrived for home visit for Kaci who reports feeling good overall just tired. He says he has been busy with church events and has not gotten much sleep. He denied shortness of breath, dizziness or chest pain. He did say he notices some lower ankle swelling but reports the lasix is helping with this.  Vitals obtained as noted. Lungs clear. Some mild lower ankle swelling, no pitting.  Meds reviewed in bubble packs. He is taking warfarin, lasix and potassium in a separate pill box due to the potential of these changing and him not wanting them in with the bubble packs.   We reviewed upcoming appointments and confirmed same.   I called Cardiac Rehab and they will be calling him to schedule for him to start.   CBG- 166 (fasting)  [name] completed Repatha assistance packet and I emailed same to pharmacist Marcelle Overlie.    Home visit complete. I will see him in two weeks, he knows to reach out if needed.   [name], [place] 11/08/2021       Patient Care Team: Ladell Pier, MD as PCP - General (Internal Medicine) Josue Hector, MD as PCP - Cardiology (Cardiology) Vickie Epley, MD as PCP - Electrophysiology (Cardiology)  Patient Active Problem List   Diagnosis Date Noted   Nonischemic cardiomyopathy [facility]) 08/27/2021   LADA (latent autoimmune diabetes in adults), managed as type 2 ([place]) 03/28/2021   Poorly controlled type 2 diabetes mellitus with circulatory disorder ([place]) 03/19/2021   Ischemic cardiomyopathy    Syncope 12/14/2020   Chest pain 10/30/2020   History of cerebrovascular accident (CVA) with residual deficit 10/27/2020   Protein-calorie malnutrition, severe 09/22/2020   Acute CVA (cerebrovascular accident) ([place]) 09/21/2020   Near syncope 09/21/2020   Encounter for monitoring Coumadin therapy 09/20/2020   LV (left  ventricular) mural thrombus 09/13/2020   Seizure ([place]) 09/13/2020   Neurological deficit present 09/12/2020   History of COVID-19 09/12/2020   Intracerebral hemorrhage 09/12/2020   Acute cerebrovascular accident (CVA) ([place]) 09/12/2020   COVID-19 08/23/2020   Noncompliance with medication treatment due to intermittent use of medication 10/12/2018   Dyslipidemia 60/63/0160   Chronic systolic CHF (congestive heart failure) ([place]) 06/18/2015   Needs flu shot 12/23/2013   Non-ischemic cardiomyopathy ([place]) 11/06/2012   HTN (hypertension) 11/06/2012   HLD (hyperlipidemia) 11/06/2012    Current Outpatient Medications:    Accu-Chek Softclix Lancets lancets, Use to check blood sugar three times daily., Disp: 100 each, Rfl: 3   acetaminophen (TYLENOL) 325 MG tablet, Take 1-2 tablets (325-650 mg total) by mouth every 4 (four) hours as needed for mild pain., Disp: , Rfl:    atorvastatin (LIPITOR) 80 MG tablet, TAKE 1 TABLET (80 MG TOTAL) BY MOUTH DAILY (EVENING), Disp: 90 tablet, Rfl: 0   Blood Glucose Monitoring Suppl (ACCU-CHEK GUIDE) w/Device KIT, Use to check blood sugar three times daily., Disp: 1 kit, Rfl: 0   Blood Pressure Monitor DEVI, Use as directed to check home blood pressure 2-3 times a week, Disp: 1 Device, Rfl: 0   Continuous Blood Gluc Receiver (FREESTYLE LIBRE 2 READER) DEVI, 1 each by Does not apply route daily., Disp: 1 each, Rfl: 0   Continuous Blood Gluc Sensor (FREESTYLE LIBRE 2 SENSOR) MISC, USE EVERY 14 (FOURTEEN) DAYS., Disp: 2 each, Rfl: 1   dapagliflozin propanediol (FARXIGA) 5 MG TABS tablet, Take 1  tablet (5 mg total) by mouth daily before breakfast., Disp: 30 tablet, Rfl: 6   digoxin (LANOXIN) 0.125 MG tablet, TAKE 1 TABLET (0.125 MG TOTAL) BY MOUTH DAILY., Disp: 30 tablet, Rfl: 2   DULoxetine (CYMBALTA) 30 MG capsule, TAKE 1 CAPSULE (30 MG TOTAL) BY MOUTH DAILY., Disp: 30 capsule, Rfl: 2   Evolocumab (REPATHA SURECLICK) 144 MG/ML SOAJ, Inject 140 mg into the skin every 14  (fourteen) days., Disp: 2 mL, Rfl: 11   ezetimibe (ZETIA) 10 MG tablet, TAKE 1 TABLET (10 MG TOTAL) BY MOUTH DAILY., Disp: 30 tablet, Rfl: 0   furosemide (LASIX) 20 MG tablet, Take 1 tablet (20 mg total) by mouth 3 (three) times a week. Mondays Wednesdays and Friday, Disp: 30 tablet, Rfl: 11   glucose blood (ACCU-CHEK GUIDE) test strip, Use to check blood sugar three times daily., Disp: 100 each, Rfl: 2   insulin aspart (FIASP FLEXTOUCH) 100 UNIT/ML FlexTouch Pen, Inject 5-8 Units into the skin 3 (three) times daily before meals., Disp: 30 mL, Rfl: 3   insulin degludec (TRESIBA FLEXTOUCH) 200 UNIT/ML FlexTouch Pen, Inject 20 Units into the skin daily., Disp: 30 mL, Rfl: 3   Insulin Pen Needle 32G X 4 MM MISC, Use to inject insulin., Disp: 200 each, Rfl: 2   Insulin Syringe-Needle U-100 (RELION INSULIN SYRINGE) 31G X 15/64" 0.3 ML MISC, Use to inject insulin daily., Disp: 100 each, Rfl: 11   ivabradine (CORLANOR) 5 MG TABS tablet, Take 1 tablet (5 mg total) by mouth 2 (two) times daily with a meal., Disp: 60 tablet, Rfl: 6   levETIRAcetam (KEPPRA) 750 MG tablet, Take 2 tablets (1,500 mg total) by mouth 2 (two) times daily., Disp: 360 tablet, Rfl: 4   Menthol, Topical Analgesic, (ICY HOT BACK EX), Apply 1 application topically as needed (neuropathy). Icy hot cream, Disp: , Rfl:    metFORMIN (GLUCOPHAGE-XR) 500 MG 24 hr tablet, Take 1 tablet (500 mg total) by mouth 2 (two) times daily with a meal., Disp: 180 tablet, Rfl: 3   metoprolol (TOPROL-XL) 200 MG 24 hr tablet, TAKE 0.5 TABLETS (100 MG TOTAL) BY MOUTH DAILY. TAKE WITH OR IMMEDIATELY FOLLOWING A MEAL (EVENING), Disp: 30 tablet, Rfl: 0   Oxcarbazepine (TRILEPTAL) 300 MG tablet, Take 2 tablets (600 mg total) by mouth 2 (two) times daily., Disp: 360 tablet, Rfl: 4   phenytoin (DILANTIN) 100 MG ER capsule, Take 1 capsule (100 mg total) by mouth 3 (three) times daily., Disp: 270 capsule, Rfl: 4   potassium chloride SA (KLOR-CON M) 20 MEQ tablet, Take 1  tablet (20 mEq total) by mouth 3 (three) times a week. Take every Mon, Wed and Fri with Lasix, Disp: 30 tablet, Rfl: 4   pregabalin (LYRICA) 300 MG capsule, TAKE 1 CAPSULE (300 MG TOTAL) BY MOUTH AT BEDTIME., Disp: 30 capsule, Rfl: 2   sacubitril-valsartan (ENTRESTO) 49-51 MG, Take 1 tablet by mouth 2 (two) times daily., Disp: 60 tablet, Rfl: 3   spironolactone (ALDACTONE) 25 MG tablet, Take 1 tablet (25 mg total) by mouth at bedtime., Disp: 90 tablet, Rfl: 3   warfarin (COUMADIN) 5 MG tablet, Take 1-1.5 tablets (5-7.5 mg total) by mouth daily as directed by Coumadin clinic, Disp: 40 tablet, Rfl: 3 No Known Allergies   Social History   Socioeconomic History   Marital status: Married    Spouse name: Felicity Coyer   Number of children: 0   Years of education: Not on file   Highest education level: Not on file  Occupational  History   Occupation: unemployed  Tobacco Use   Smoking status: Never   Smokeless tobacco: Never  Vaping Use   Vaping Use: Not on file  Substance and Sexual Activity   Alcohol use: Not Currently    Comment: "occasional" when "hanging out with the wrong people" No recent use.   Drug use: Not Currently    Types: Marijuana    Comment: occasional, last 2013   Sexual activity: Not Currently  Other Topics Concern   Not on file  Social History Narrative   Pt lives at home with his wife    Right handed    Caffeine- hardly any   Social Determinants of Health   Financial Resource Strain: High Risk (10/18/2021)   Overall Financial Resource Strain (CARDIA)    Difficulty of Paying Living Expenses: Hard  Food Insecurity: No Food Insecurity (09/14/2020)   Hunger Vital Sign    Worried About Running Out of Food in the Last Year: Never true    Ran Out of Food in the Last Year: Never true  Recent Concern: Food Insecurity - Food Insecurity Present (09/13/2020)   Hunger Vital Sign    Worried About Running Out of Food in the Last Year: Never true    Ran Out of Food in the Last Year:  Sometimes true  Transportation Needs: Unmet Transportation Needs (12/15/2020)   PRAPARE - Hydrologist (Medical): No    Lack of Transportation (Non-Medical): Yes  Physical Activity: Not on file  Stress: Not on file  Social Connections: Not on file  Intimate Partner Violence: Not on file    Physical Exam      Future Appointments  Date Time Provider [place]  11/12/2021  7:15 AM [facility] DEVICE REMOTES [facility]  11/15/2021  9:20 AM Philemon Kingdom, MD [facility] None  11/16/2021  9:30 AM [facility] LAB [facility] None  11/19/2021  8:45 AM [facility]  11/29/2021  7:00 AM [facility] DEVICE REMOTES [facility]  12/05/2021  3:00 PM Vickie Epley, MD [facility]  02/28/2022  7:00 AM [facility] DEVICE REMOTES [facility]  05/30/2022  7:00 AM [facility] DEVICE REMOTES [facility]  07/01/2022  1:30 PM Penumalli, Earlean Polka, MD [facility] None  08/29/2022  7:00 AM [facility] DEVICE REMOTES [facility]  11/28/2022  7:00 AM [facility] DEVICE REMOTES [facility]     ACTION: Home visit completed

## 2021-11-08 NOTE — Telephone Encounter (Signed)
Patient now has medicaid and will need to get Repatha through Medicaid instead of patient assistance. I called and explained this to patient. I have submitted a prior authorization to [facility] via fax today.

## 2021-11-09 ENCOUNTER — Other Ambulatory Visit (HOSPITAL_COMMUNITY): Payer: Self-pay

## 2021-11-09 ENCOUNTER — Telehealth (HOSPITAL_COMMUNITY): Payer: Self-pay | Admitting: *Deleted

## 2021-11-12 ENCOUNTER — Telehealth: Payer: Self-pay

## 2021-11-12 ENCOUNTER — Ambulatory Visit (INDEPENDENT_AMBULATORY_CARE_PROVIDER_SITE_OTHER): Payer: Medicaid Other

## 2021-11-12 DIAGNOSIS — Z95810 Presence of automatic (implantable) cardiac defibrillator: Secondary | ICD-10-CM | POA: Diagnosis not present

## 2021-11-12 DIAGNOSIS — I5022 Chronic systolic (congestive) heart failure: Secondary | ICD-10-CM | POA: Diagnosis not present

## 2021-11-12 NOTE — Progress Notes (Signed)
EPIC Encounter for ICM Monitoring  Patient Name: Jason Stevenson is a 36 y.o. male Date: 11/12/2021 Primary Care Physican: Marcine Matar, MD Primary Cardiologist: Shirlee Latch Electrophysiologist: Lalla Brothers Weight: unknown       ICM remote transmission fluid level check for HF clinic.   Attempted call to patient and unable to reach. Transmission reviewed.    Optivol thoracic impedance returned normal after starting Lasix prescription for every Mon, Wed and Friday as prescribed at HF clinic appointment on 7/21.   Prescribed:  Furosemide 20 mg take 1 tablet(s) (20 mg total) by mouth every Mon, Wed and Friday.  Recommendations: Unable to reach.    Follow-up plan:  No further ICM clinic phone appointments.   91 day device clinic remote transmission 11/29/2021.    EP/Cardiology Office Visits: N/A  Copy of ICM check sent to Dr. Lalla Brothers and Prince Rome, NP as requested after last OV.   3 month ICM trend: 11/12/2021.    12-14 Month ICM trend:     Jason Soda, RN 11/12/2021 4:05 PM

## 2021-11-12 NOTE — Telephone Encounter (Signed)
Remote ICM transmission received.  Attempted call to patient regarding ICM remote transmission and no answer.  

## 2021-11-13 ENCOUNTER — Encounter (HOSPITAL_COMMUNITY): Payer: Self-pay

## 2021-11-13 ENCOUNTER — Encounter (HOSPITAL_COMMUNITY)
Admission: RE | Admit: 2021-11-13 | Discharge: 2021-11-13 | Disposition: A | Discharge status: Home / Self Care | Payer: Medicaid Other | Source: Ambulatory Visit | Attending: Cardiology | Admitting: Cardiology

## 2021-11-13 VITALS — BP 110/70 | HR 64 | Ht 72.0 in | Wt 158.7 lb

## 2021-11-13 DIAGNOSIS — I5022 Chronic systolic (congestive) heart failure: Secondary | ICD-10-CM | POA: Diagnosis present

## 2021-11-13 DIAGNOSIS — Z48812 Encounter for surgical aftercare following surgery on the circulatory system: Secondary | ICD-10-CM | POA: Diagnosis not present

## 2021-11-13 LAB — GLUCOSE, CAPILLARY: Glucose-Capillary: 165 mg/dL — ABNORMAL HIGH (ref 70–99)

## 2021-11-13 NOTE — Progress Notes (Signed)
Cardiac Rehab Medication Review by a Nurse   Does the patient  feel that his/her medications are working for him/her?  YES   Has the patient been experiencing any side effects to the medications prescribed?  NO   Does the patient measure his/her own blood pressure or blood glucose at home?  YES    Does the patient have any problems obtaining medications due to transportation or finances?   YES   Understanding of regimen: fair Understanding of indications: fair Potential of compliance: good  Patient taking medications as prescribed, patient reports intermittently monitoring blood pressure and blood glucose levels, patient awaiting authorization for Freestyle CGM, he currently has standard glucose monitor as well as automatic blood pressure cuff.

## 2021-11-13 NOTE — Progress Notes (Signed)
Message Received: Today Jason Stevenson, Jason Malta, FNP  Charlott Calvario, Josephine Igo, RN Thanks Mahaley Schwering, no further changes

## 2021-11-13 NOTE — Progress Notes (Signed)
Cardiac Individual Treatment Plan  Patient Details  Name: Jason Stevenson MRN: [id number] Date of Birth: 12/04/1985 Referring Provider:   Flowsheet Row CARDIAC REHAB PHASE II ORIENTATION from 11/13/2021 in [facility]  Referring Provider Dalton McClean MD       Initial Encounter Date:  Flowsheet Row CARDIAC REHAB PHASE II ORIENTATION from 11/13/2021 in [facility]  Date 11/13/21       Visit Diagnosis: Heart failure, chronic systolic (HCC)  Patient's Home Medications on Admission:  Current Outpatient Medications:    Accu-Chek Softclix Lancets lancets, Use to check blood sugar three times daily., Disp: 100 each, Rfl: 3   acetaminophen (TYLENOL) 325 MG tablet, Take 1-2 tablets (325-650 mg total) by mouth every 4 (four) hours as needed for mild pain., Disp: , Rfl:    atorvastatin (LIPITOR) 80 MG tablet, TAKE 1 TABLET (80 MG TOTAL) BY MOUTH DAILY (EVENING), Disp: 90 tablet, Rfl: 0   Blood Glucose Monitoring Suppl (ACCU-CHEK GUIDE) w/Device KIT, Use to check blood sugar three times daily., Disp: 1 kit, Rfl: 0   Blood Pressure Monitor DEVI, Use as directed to check home blood pressure 2-3 times a week, Disp: 1 Device, Rfl: 0   dapagliflozin propanediol (FARXIGA) 5 MG TABS tablet, Take 1 tablet (5 mg total) by mouth daily before breakfast., Disp: 30 tablet, Rfl: 6   digoxin (LANOXIN) 0.125 MG tablet, Take 1 tablet (0.125 mg total) by mouth daily. Please request Cardiologist for future refills., Disp: 30 tablet, Rfl: 0   DULoxetine (CYMBALTA) 30 MG capsule, TAKE 1 CAPSULE (30 MG TOTAL) BY MOUTH DAILY., Disp: 30 capsule, Rfl: 2   Evolocumab (REPATHA SURECLICK) 140 MG/ML SOAJ, Inject 140 mg into the skin every 14 (fourteen) days., Disp: 2 mL, Rfl: 11   ezetimibe (ZETIA) 10 MG tablet, TAKE 1 TABLET (10 MG TOTAL) BY MOUTH DAILY., Disp: 30 tablet, Rfl: 0   furosemide (LASIX) 20 MG tablet, Take 1 tablet (20 mg total) by mouth 3 (three)  times a week. Mondays Wednesdays and Friday, Disp: 30 tablet, Rfl: 11   glucose blood (ACCU-CHEK GUIDE) test strip, Use to check blood sugar three times daily., Disp: 100 each, Rfl: 2   insulin aspart (FIASP FLEXTOUCH) 100 UNIT/ML FlexTouch Pen, Inject 5-8 Units into the skin 3 (three) times daily before meals., Disp: 30 mL, Rfl: 3   insulin degludec (TRESIBA FLEXTOUCH) 200 UNIT/ML FlexTouch Pen, Inject 20 Units into the skin daily., Disp: 30 mL, Rfl: 3   Insulin Pen Needle 32G X 4 MM MISC, Use to inject insulin., Disp: 200 each, Rfl: 2   Insulin Syringe-Needle U-100 (RELION INSULIN SYRINGE) 31G X 15/64" 0.3 ML MISC, Use to inject insulin daily., Disp: 100 each, Rfl: 11   ivabradine (CORLANOR) 5 MG TABS tablet, Take 1 tablet (5 mg total) by mouth 2 (two) times daily with a meal., Disp: 60 tablet, Rfl: 6   levETIRAcetam (KEPPRA) 750 MG tablet, Take 2 tablets (1,500 mg total) by mouth 2 (two) times daily., Disp: 360 tablet, Rfl: 4   Menthol, Topical Analgesic, (ICY HOT BACK EX), Apply 1 application topically as needed (neuropathy). Icy hot cream, Disp: , Rfl:    metFORMIN (GLUCOPHAGE-XR) 500 MG 24 hr tablet, Take 1 tablet (500 mg total) by mouth 2 (two) times daily with a meal., Disp: 180 tablet, Rfl: 3   metoprolol (TOPROL-XL) 200 MG 24 hr tablet, TAKE 0.5 TABLETS (100 MG TOTAL) BY MOUTH DAILY. TAKE WITH OR IMMEDIATELY FOLLOWING A MEAL (  EVENING), Disp: 30 tablet, Rfl: 0   Oxcarbazepine (TRILEPTAL) 300 MG tablet, Take 2 tablets (600 mg total) by mouth 2 (two) times daily., Disp: 360 tablet, Rfl: 4   phenytoin (DILANTIN) 100 MG ER capsule, Take 1 capsule (100 mg total) by mouth 3 (three) times daily., Disp: 270 capsule, Rfl: 4   potassium chloride SA (KLOR-CON M) 20 MEQ tablet, Take 1 tablet (20 mEq total) by mouth 3 (three) times a week. Take every Mon, Wed and Fri with Lasix, Disp: 30 tablet, Rfl: 4   pregabalin (LYRICA) 300 MG capsule, TAKE 1 CAPSULE (300 MG TOTAL) BY MOUTH AT BEDTIME., Disp: 30  capsule, Rfl: 2   sacubitril-valsartan (ENTRESTO) 49-51 MG, Take 1 tablet by mouth 2 (two) times daily., Disp: 60 tablet, Rfl: 3   spironolactone (ALDACTONE) 25 MG tablet, Take 1 tablet (25 mg total) by mouth at bedtime., Disp: 90 tablet, Rfl: 3   warfarin (COUMADIN) 5 MG tablet, Take 1-1.5 tablets (5-7.5 mg total) by mouth daily as directed by Coumadin clinic, Disp: 40 tablet, Rfl: 3   Continuous Blood Gluc Receiver (FREESTYLE LIBRE 2 READER) DEVI, 1 each by Does not apply route daily. (Patient not taking: Reported on 11/13/2021), Disp: 1 each, Rfl: 0   Continuous Blood Gluc Sensor (FREESTYLE LIBRE 2 SENSOR) MISC, USE EVERY 14 (FOURTEEN) DAYS. (Patient not taking: Reported on 11/13/2021), Disp: 2 each, Rfl: 6  Past Medical History: Past Medical History:  Diagnosis Date   Chronic systolic CHF (congestive heart failure) (HCC) 06/18/2015   Diabetes mellitus without complication (HCC)    Hemorrhagic stroke (HCC)    Hyperlipidemia    Hypertension    Nonischemic cardiomyopathy (HCC) Noted as early as 2007   Per chart review (cards consult note 2011), EF of 40% in 2007, down to 20-25% in 2011    Tobacco Use: Social History   Tobacco Use  Smoking Status Never  Smokeless Tobacco Never    Labs: Review Flowsheet  More data exists      Latest Ref Rng & Units 12/14/2020 01/29/2021 04/19/2021 06/08/2021 09/21/2021  Labs for ITP Cardiac and Pulmonary Rehab  Cholestrol 0 - 200 mg/dL - - - 107  133   LDL (calc) 0 - 99 mg/dL - - - 31  56   HDL-C >40 mg/dL - - - 49  47   Trlycerides <150 mg/dL - - - 166  149   Hemoglobin A1c 4.6 - 6.5 % 8.3  8.2  7.7  - -    Capillary Blood Glucose: Lab Results  Component Value Date   GLUCAP 165 (H) 11/13/2021   GLUCAP 106 (H) 08/28/2021   GLUCAP 272 (H) 08/27/2021   GLUCAP 148 (H) 08/27/2021   GLUCAP 193 (H) 08/27/2021     Exercise Target Goals: Exercise Program Goal: Individual exercise prescription set using results from initial 6 min walk test and THRR  while considering  patient's activity barriers and safety.   Exercise Prescription Goal: Initial exercise prescription builds to 30-45 minutes a day of aerobic activity, 2-3 days per week.  Home exercise guidelines will be given to patient during program as part of exercise prescription that the participant will acknowledge.  Activity Barriers & Risk Stratification:  Activity Barriers & Cardiac Risk Stratification - 11/13/21 1220       Activity Barriers & Cardiac Risk Stratification   Activity Barriers Back Problems;Deconditioning;Neck/Spine Problems;Decreased Ventricular Function;Balance Concerns;Shortness of Breath;Other (comment)    Comments Neuropathy in hands and feet    Cardiac Risk Stratification High               6 Minute Walk:  6 Minute Walk     Row Name 11/13/21 0955         6 Minute Walk   Phase Initial     Distance 770 feet     Walk Time 6 minutes     # of Rest Breaks 1  5:05-6min, lightheaded and fatigue     MPH 1.46     METS 3.8     RPE 13     Perceived Dyspnea  2     VO2 Peak 13.3     Symptoms Yes (comment)     Comments Lightheadedness, fatigue, RPD 2     Resting HR 64 bpm     Resting BP 110/70     Resting Oxygen Saturation  98 %     Exercise Oxygen Saturation  during 6 min walk 100 %     Max Ex. HR 70 bpm     Max Ex. BP 110/70     2 Minute Post BP 105/70              Oxygen Initial Assessment:   Oxygen Re-Evaluation:   Oxygen Discharge (Final Oxygen Re-Evaluation):   Initial Exercise Prescription:  Initial Exercise Prescription - 11/13/21 1200       Date of Initial Exercise RX and Referring Provider   Date 11/13/21    Referring Provider Dalton McClean MD    Expected Discharge Date 01/18/22      NuStep   Level 1    SPM 75    Minutes 25    METs 2.5      Prescription Details   Frequency (times per week) 3    Duration Progress to 10 minutes continuous walking  at current work load and total walking time to 30-45 min       Intensity   THRR 40-80% of Max Heartrate 73-146    Ratings of Perceived Exertion 11-13    Perceived Dyspnea 0-4      Progression   Progression Continue progressive overload as per policy without signs/symptoms or physical distress.      Resistance Training   Training Prescription Yes    Weight 3lbs    Reps 10-15             Perform Capillary Blood Glucose checks as needed.  Exercise Prescription Changes:   Exercise Comments:   Exercise Goals and Review:   Exercise Goals     Row Name 11/13/21 1223             Exercise Goals   Increase Physical Activity Yes       Intervention Provide advice, education, support and counseling about physical activity/exercise needs.;Develop an individualized exercise prescription for aerobic and resistive training based on initial evaluation findings, risk stratification, comorbidities and participant's personal goals.       Expected Outcomes Long Term: Exercising regularly at least 3-5 days a week.;Long Term: Add in home exercise to make exercise part of routine and to increase amount of physical activity.;Short Term: Attend rehab on a regular basis to increase amount of physical activity.       Increase Strength and Stamina Yes       Intervention Provide advice, education, support and counseling about physical activity/exercise needs.;Develop an individualized exercise prescription for aerobic and resistive training based on initial evaluation findings, risk stratification, comorbidities and participant's personal goals.       Expected Outcomes Short Term: Increase workloads from initial exercise prescription for resistance, speed, and METs.;Short Term:   Perform resistance training exercises routinely during rehab and add in resistance training at home;Long Term: Improve cardiorespiratory fitness, muscular endurance and strength as measured by increased METs and functional capacity (6MWT)       Able to understand and use rate of perceived  exertion (RPE) scale Yes       Intervention Provide education and explanation on how to use RPE scale       Expected Outcomes Long Term:  Able to use RPE to guide intensity level when exercising independently;Short Term: Able to use RPE daily in rehab to express subjective intensity level       Knowledge and understanding of Target Heart Rate Range (THRR) Yes       Intervention Provide education and explanation of THRR including how the numbers were predicted and where they are located for reference       Expected Outcomes Short Term: Able to state/look up THRR;Long Term: Able to use THRR to govern intensity when exercising independently;Short Term: Able to use daily as guideline for intensity in rehab       Understanding of Exercise Prescription Yes       Intervention Provide education, explanation, and written materials on patient's individual exercise prescription       Expected Outcomes Long Term: Able to explain home exercise prescription to exercise independently;Short Term: Able to explain program exercise prescription                Exercise Goals Re-Evaluation :   Discharge Exercise Prescription (Final Exercise Prescription Changes):   Nutrition:  Target Goals: Understanding of nutrition guidelines, daily intake of sodium <1500mg, cholesterol <200mg, calories 30% from fat and 7% or less from saturated fats, daily to have 5 or more servings of fruits and vegetables.  Biometrics:  Pre Biometrics - 11/13/21 0913       Pre Biometrics   Waist Circumference 33.5 inches    Hip Circumference 37.5 inches    Waist to Hip Ratio 0.89 %    Triceps Skinfold 9 mm    % Body Fat 18.9 %    Grip Strength 40 kg    Flexibility --   Not done due to pt reporting back pain   Single Leg Stand 17.31 seconds              Nutrition Therapy Plan and Nutrition Goals:   Nutrition Assessments:  MEDIFICTS Score Key: ?70 Need to make dietary changes  40-70 Heart Healthy Diet ? 40  Therapeutic Level Cholesterol Diet    Picture Your Plate Scores: <40 Unhealthy dietary pattern with much room for improvement. 41-50 Dietary pattern unlikely to meet recommendations for good health and room for improvement. 51-60 More healthful dietary pattern, with some room for improvement.  >60 Healthy dietary pattern, although there may be some specific behaviors that could be improved.    Nutrition Goals Re-Evaluation:   Nutrition Goals Re-Evaluation:   Nutrition Goals Discharge (Final Nutrition Goals Re-Evaluation):   Psychosocial: Target Goals: Acknowledge presence or absence of significant depression and/or stress, maximize coping skills, provide positive support system. Participant is able to verbalize types and ability to use techniques and skills needed for reducing stress and depression.  Initial Review & Psychosocial Screening:  Initial Psych Review & Screening - 11/13/21 1206       Initial Review   Current issues with History of Depression;Current Depression;Current Sleep Concerns;Current Stress Concerns    Source of Stress Concerns Chronic Illness;Transportation;Financial    Comments Patient reports expirencing low level   depression, states current antidepressants are effective however he could benefit from counseling, good support sysytem with wife and brother and law who is a Theme park manager.      Family Dynamics   Good Support System? Yes   Carlin states his spouse is a good support for him, physically, financially and emotionally, he also has good support in his brother in law who is a Theme park manager.     Barriers   Psychosocial barriers to participate in program The patient should benefit from training in stress management and relaxation.      Screening Interventions   Interventions Encouraged to exercise;To provide support and resources with identified psychosocial needs;Provide feedback about the scores to participant    Expected Outcomes Short Term goal: Identification and  review with participant of any Quality of Life or Depression concerns found by scoring the questionnaire.;Long Term goal: The participant improves quality of Life and PHQ9 Scores as seen by post scores and/or verbalization of changes;Long Term Goal: Stressors or current issues are controlled or eliminated.;Short Term goal: Utilizing psychosocial counselor, staff and physician to assist with identification of specific Stressors or current issues interfering with healing process. Setting desired goal for each stressor or current issue identified.             Quality of Life Scores:  Quality of Life - 11/13/21 1211       Quality of Life   Select Quality of Life      Quality of Life Scores   Health/Function Pre 15.23 %    Socioeconomic Pre 8.29 %    Psych/Spiritual Pre 20.57 %    Family Pre 21 %    GLOBAL Pre 15.59 %            Scores of 19 and below usually indicate a poorer quality of life in these areas.  A difference of  2-3 points is a clinically meaningful difference.  A difference of 2-3 points in the total score of the Quality of Life Index has been associated with significant improvement in overall quality of life, self-image, physical symptoms, and general health in studies assessing change in quality of life.  PHQ-9: Review Flowsheet  More data exists      11/13/2021 04/20/2021 02/05/2021 10/27/2020 04/26/2019  Depression screen PHQ 2/9  Decreased Interest 0 1 1 0 0  Down, Depressed, Hopeless _0 0  PHQ - 2 Score _1 0  Altered sleeping _2 - -  Tired, decreased energy _3 - -  Change in appetite _4 - -  Feeling bad or failure about yourself  0 0 1 - -  Trouble concentrating 1 0 0 - -  Moving slowly or fidgety/restless 1 0 0 - -  Suicidal thoughts 1 0 0 - -  PHQ-9 Score _5 - -  Difficult doing work/chores Somewhat difficult - - - -   Interpretation of Total Score  Total Score Depression Severity:  1-4 = Minimal depression, 5-9 = Mild depression,  10-14 = Moderate depression, 15-19 = Moderately severe depression, 20-27 = Severe depression   Psychosocial Evaluation and Intervention:   Psychosocial Re-Evaluation:   Psychosocial Discharge (Final Psychosocial Re-Evaluation):   Vocational Rehabilitation: Provide vocational rehab assistance to qualifying candidates.   Vocational Rehab Evaluation & Intervention:  Vocational Rehab - 11/13/21 1217       Initial Vocational Rehab Evaluation & Intervention   Assessment shows need for Vocational Rehabilitation No   unemployed,  interested in obtaining disability services            Education: Education Goals: Education classes will be provided on a weekly basis, covering required topics. Participant will state understanding/return demonstration of topics presented.     Core Videos: Exercise    Move It!  Clinical staff conducted group or individual video education with verbal and written material and guidebook.  Patient learns the recommended Pritikin exercise program. Exercise with the goal of living a long, healthy life. Some of the health benefits of exercise include controlled diabetes, healthier blood pressure levels, improved cholesterol levels, improved heart and lung capacity, improved sleep, and better body composition. Everyone should speak with their doctor before starting or changing an exercise routine.  Biomechanical Limitations Clinical staff conducted group or individual video education with verbal and written material and guidebook.  Patient learns how biomechanical limitations can impact exercise and how we can mitigate and possibly overcome limitations to have an impactful and balanced exercise routine.  Body Composition Clinical staff conducted group or individual video education with verbal and written material and guidebook.  Patient learns that body composition (ratio of muscle mass to fat mass) is a key component to assessing overall fitness, rather than  body weight alone. Increased fat mass, especially visceral belly fat, can put [place] at increased risk for metabolic syndrome, type 2 diabetes, heart disease, and even death. It is recommended to combine diet and exercise (cardiovascular and resistance training) to improve your body composition. Seek guidance from your physician and exercise physiologist before implementing an exercise routine.  Exercise Action Plan Clinical staff conducted group or individual video education with verbal and written material and guidebook.  Patient learns the recommended strategies to achieve and enjoy long-term exercise adherence, including variety, self-motivation, self-efficacy, and positive decision making. Benefits of exercise include fitness, good health, weight management, more energy, better sleep, less stress, and overall well-being.  Medical   Heart Disease Risk Reduction Clinical staff conducted group or individual video education with verbal and written material and guidebook.  Patient learns our heart is our most vital organ as it circulates oxygen, nutrients, white blood cells, and hormones throughout the entire body, and carries waste away. Data supports a plant-based eating plan like the Pritikin Program for its effectiveness in slowing progression of and reversing heart disease. The video provides a number of recommendations to address heart disease.   Metabolic Syndrome and Belly Fat  Clinical staff conducted group or individual video education with verbal and written material and guidebook.  Patient learns what metabolic syndrome is, how it leads to heart disease, and how one can reverse it and keep it from coming back. You have metabolic syndrome if you have 3 of the following 5 criteria: abdominal obesity, high blood pressure, high triglycerides, low HDL cholesterol, and high blood sugar.  Hypertension and Heart Disease Clinical staff conducted group or individual video education with verbal and  written material and guidebook.  Patient learns that high blood pressure, or hypertension, is very common in the [place]. Hypertension is largely due to excessive salt intake, but other important risk factors include being overweight, physical inactivity, drinking too much alcohol, smoking, and not eating enough potassium from fruits and vegetables. High blood pressure is a leading risk factor for heart attack, stroke, congestive heart failure, dementia, kidney failure, and premature death. Long-term effects of excessive salt intake include stiffening of the arteries and thickening of heart muscle and organ damage. Recommendations include ways to reduce hypertension  and the risk of heart disease.  Diseases of Our Time - Focusing on Diabetes Clinical staff conducted group or individual video education with verbal and written material and guidebook.  Patient learns why the best way to stop diseases of our time is prevention, through food and other lifestyle changes. Medicine (such as prescription pills and surgeries) is often only a Band-Aid on the problem, not a long-term solution. Most common diseases of our time include obesity, type 2 diabetes, hypertension, heart disease, and cancer. The Pritikin Program is recommended and has been proven to help reduce, reverse, and/or prevent the damaging effects of metabolic syndrome.  Nutrition   Overview of the Pritikin Eating Plan  Clinical staff conducted group or individual video education with verbal and written material and guidebook.  Patient learns about the Pritikin Eating Plan for disease risk reduction. The Pritikin Eating Plan emphasizes a wide variety of unrefined, minimally-processed carbohydrates, like fruits, vegetables, whole grains, and legumes. Go, Caution, and Stop food choices are explained. Plant-based and lean animal proteins are emphasized. Rationale provided for low sodium intake for blood pressure control, low added sugars for blood  sugar stabilization, and low added fats and oils for coronary artery disease risk reduction and weight management.  Calorie Density  Clinical staff conducted group or individual video education with verbal and written material and guidebook.  Patient learns about calorie density and how it impacts the Pritikin Eating Plan. Knowing the characteristics of the food you choose will help you decide whether those foods will lead to weight gain or weight loss, and whether you want to consume more or less of them. Weight loss is usually a side effect of the Pritikin Eating Plan because of its focus on low calorie-dense foods.  Label Reading  Clinical staff conducted group or individual video education with verbal and written material and guidebook.  Patient learns about the Pritikin recommended label reading guidelines and corresponding recommendations regarding calorie density, added sugars, sodium content, and whole grains.  Dining Out - Part 1  Clinical staff conducted group or individual video education with verbal and written material and guidebook.  Patient learns that restaurant meals can be sabotaging because they can be so high in calories, fat, sodium, and/or sugar. Patient learns recommended strategies on how to positively address this and avoid unhealthy pitfalls.  Facts on Fats  Clinical staff conducted group or individual video education with verbal and written material and guidebook.  Patient learns that lifestyle modifications can be just as effective, if not more so, as many medications for lowering your risk of heart disease. A Pritikin lifestyle can help to reduce your risk of inflammation and atherosclerosis (cholesterol build-up, or plaque, in the artery walls). Lifestyle interventions such as dietary choices and physical activity address the cause of atherosclerosis. A review of the types of fats and their impact on blood cholesterol levels, along with dietary recommendations to reduce  fat intake is also included.  Nutrition Action Plan  Clinical staff conducted group or individual video education with verbal and written material and guidebook.  Patient learns how to incorporate Pritikin recommendations into their lifestyle. Recommendations include planning and keeping personal health goals in mind as an important part of their success.  Healthy Mind-Set    Healthy Minds, Bodies, Hearts  Clinical staff conducted group or individual video education with verbal and written material and guidebook.  Patient learns how to identify when they are stressed. Video will discuss the impact of that stress, as well as the   many benefits of stress management. Patient will also be introduced to stress management techniques. The way we think, act, and feel has an impact on our hearts.  How Our Thoughts Can Heal Our Hearts  Clinical staff conducted group or individual video education with verbal and written material and guidebook.  Patient learns that negative thoughts can cause depression and anxiety. This can result in negative lifestyle behavior and serious health problems. Cognitive behavioral therapy is an effective method to help control our thoughts in order to change and improve our emotional outlook.  Additional Videos:  Exercise    Improving Performance  Clinical staff conducted group or individual video education with verbal and written material and guidebook.  Patient learns to use a non-linear approach by alternating intensity levels and lengths of time spent exercising to help burn more calories and lose more body fat. Cardiovascular exercise helps improve heart health, metabolism, hormonal balance, blood sugar control, and recovery from fatigue. Resistance training improves strength, endurance, balance, coordination, reaction time, metabolism, and muscle mass. Flexibility exercise improves circulation, posture, and balance. Seek guidance from your physician and exercise  physiologist before implementing an exercise routine and learn your capabilities and proper form for all exercise.  Introduction to Yoga  Clinical staff conducted group or individual video education with verbal and written material and guidebook.  Patient learns about yoga, a discipline of the coming together of mind, breath, and body. The benefits of yoga include improved flexibility, improved range of motion, better posture and core strength, increased lung function, weight loss, and positive self-image. Yoga's heart health benefits include lowered blood pressure, healthier heart rate, decreased cholesterol and triglyceride levels, improved immune function, and reduced stress. Seek guidance from your physician and exercise physiologist before implementing an exercise routine and learn your capabilities and proper form for all exercise.  Medical   Aging: Enhancing Your Quality of Life  Clinical staff conducted group or individual video education with verbal and written material and guidebook.  Patient learns key strategies and recommendations to stay in good physical health and enhance quality of life, such as prevention strategies, having an advocate, securing a Health Care Proxy and Power of Attorney, and keeping a list of medications and system for tracking them. It also discusses how to avoid risk for bone loss.  Biology of Weight Control  Clinical staff conducted group or individual video education with verbal and written material and guidebook.  Patient learns that weight gain occurs because we consume more calories than we burn (eating more, moving less). Even if your body weight is normal, you may have higher ratios of fat compared to muscle mass. Too much body fat puts you at increased risk for cardiovascular disease, heart attack, stroke, type 2 diabetes, and obesity-related cancers. In addition to exercise, following the Pritikin Eating Plan can help reduce your risk.  Decoding Lab Results   Clinical staff conducted group or individual video education with verbal and written material and guidebook.  Patient learns that lab test reflects one measurement whose values change over time and are influenced by many factors, including medication, stress, sleep, exercise, food, hydration, pre-existing medical conditions, and more. It is recommended to use the knowledge from this video to become more involved with your lab results and evaluate your numbers to speak with your doctor.   Diseases of Our Time - Overview  Clinical staff conducted group or individual video education with verbal and written material and guidebook.  Patient learns that according to the CDC, 50%   to 70% of chronic diseases (such as obesity, type 2 diabetes, elevated lipids, hypertension, and heart disease) are avoidable through lifestyle improvements including healthier food choices, listening to satiety cues, and increased physical activity.  Sleep Disorders Clinical staff conducted group or individual video education with verbal and written material and guidebook.  Patient learns how good quality and duration of sleep are important to overall health and well-being. Patient also learns about sleep disorders and how they impact health along with recommendations to address them, including discussing with a physician.  Nutrition  Dining Out - Part 2 Clinical staff conducted group or individual video education with verbal and written material and guidebook.  Patient learns how to plan ahead and communicate in order to maximize their dining experience in a healthy and nutritious manner. Included are recommended food choices based on the type of restaurant the patient is visiting.   Fueling a Best boy conducted group or individual video education with verbal and written material and guidebook.  There is a strong connection between our food choices and our health. Diseases like obesity and type 2 diabetes  are very prevalent and are in large-part due to lifestyle choices. The Pritikin Eating Plan provides plenty of food and hunger-curbing satisfaction. It is easy to follow, affordable, and helps reduce health risks.  Menu Workshop  Clinical staff conducted group or individual video education with verbal and written material and guidebook.  Patient learns that restaurant meals can sabotage health goals because they are often packed with calories, fat, sodium, and sugar. Recommendations include strategies to plan ahead and to communicate with the manager, chef, or server to help order a healthier meal.  Planning Your Eating Strategy  Clinical staff conducted group or individual video education with verbal and written material and guidebook.  Patient learns about the [place] and its benefit of reducing the risk of disease. The [place] does not focus on calories. Instead, it emphasizes high-quality, nutrient-rich foods. By knowing the characteristics of the foods, we choose, we can determine their calorie density and make informed decisions.  Targeting Your Nutrition Priorities  Clinical staff conducted group or individual video education with verbal and written material and guidebook.  Patient learns that lifestyle habits have a tremendous impact on disease risk and progression. This video provides eating and physical activity recommendations based on your personal health goals, such as reducing LDL cholesterol, losing weight, preventing or controlling type 2 diabetes, and reducing high blood pressure.  Vitamins and Minerals  Clinical staff conducted group or individual video education with verbal and written material and guidebook.  Patient learns different ways to obtain key vitamins and minerals, including through a recommended healthy diet. It is important to discuss all supplements you take with your doctor.   Healthy Mind-Set    Smoking Cessation  Clinical staff  conducted group or individual video education with verbal and written material and guidebook.  Patient learns that cigarette smoking and tobacco addiction pose a serious health risk which affects millions of people. Stopping smoking will significantly reduce the risk of heart disease, lung disease, and many forms of cancer. Recommended strategies for quitting are covered, including working with your doctor to develop a successful plan.  Culinary   Becoming a Financial trader conducted group or individual video education with verbal and written material and guidebook.  Patient learns that cooking at home can be healthy, cost-effective, quick, and puts them in control. Keys to cooking healthy  recipes will include looking at your recipe, assessing your equipment needs, planning ahead, making it simple, choosing cost-effective seasonal ingredients, and limiting the use of added fats, salts, and sugars.  Cooking - Breakfast and Snacks  Clinical staff conducted group or individual video education with verbal and written material and guidebook.  Patient learns how important breakfast is to satiety and nutrition through the entire day. Recommendations include key foods to eat during breakfast to help stabilize blood sugar levels and to prevent overeating at meals later in the day. Planning ahead is also a key component.  Cooking - Dinner and Sides  Clinical staff conducted group or individual video education with verbal and written material and guidebook.  Patient learns eating strategies to improve overall health, including an approach to cook more at home. Recommendations include thinking of animal protein as a side on your plate rather than center stage and focusing instead on lower calorie dense options like vegetables, fruits, whole grains, and plant-based proteins, such as beans. Making sauces in large quantities to freeze for later and leaving the skin on your vegetables are also  recommended to maximize your experience.  Cooking - Healthy Salads and Dressing Clinical staff conducted group or individual video education with verbal and written material and guidebook.  Patient learns that vegetables, fruits, whole grains, and legumes are the foundations of the Pritikin Eating Plan. Recommendations include how to incorporate each of these in flavorful and healthy salads, and how to create homemade salad dressings. Proper handling of ingredients is also covered. Cooking - Soups and Desserts  Cooking - Soups and Desserts Clinical staff conducted group or individual video education with verbal and written material and guidebook.  Patient learns that Pritikin soups and desserts make for easy, nutritious, and delicious snacks and meal components that are low in sodium, fat, sugar, and calorie density, while high in vitamins, minerals, and filling fiber. Recommendations include simple and healthy ideas for soups and desserts.   Overview     The Pritikin Solution Program Overview Clinical staff conducted group or individual video education with verbal and written material and guidebook.  Patient learns that the results of the Pritikin Program have been documented in more than 100 articles published in peer-reviewed journals, and the benefits include reducing risk factors for (and, in some cases, even reversing) high cholesterol, high blood pressure, type 2 diabetes, obesity, and more! An overview of the three key pillars of the Pritikin Program will be covered: eating well, doing regular exercise, and having a healthy mind-set.  WORKSHOPS  Exercise: Exercise Basics: Building Your Action Plan Clinical staff led group instruction and group discussion with PowerPoint presentation and patient guidebook. To enhance the learning environment the use of posters, models and videos may be added. At the conclusion of this workshop, patients will comprehend the difference between physical  activity and exercise, as well as the benefits of incorporating both, into their routine. Patients will understand the FITT (Frequency, Intensity, Time, and Type) principle and how to use it to build an exercise action plan. In addition, safety concerns and other considerations for exercise and cardiac rehab will be addressed by the presenter. The purpose of this lesson is to promote a comprehensive and effective weekly exercise routine in order to improve patients' overall level of fitness.   Managing Heart Disease: Your Path to a Healthier Heart Clinical staff led group instruction and group discussion with PowerPoint presentation and patient guidebook. To enhance the learning environment the use of posters, models and   videos may be added.At the conclusion of this workshop, patients will understand the anatomy and physiology of the heart. Additionally, they will understand how Pritikin's three pillars impact the risk factors, the progression, and the management of heart disease.  The purpose of this lesson is to provide a high-level overview of the heart, heart disease, and how the Pritikin lifestyle positively impacts risk factors.  Exercise Biomechanics Clinical staff led group instruction and group discussion with PowerPoint presentation and patient guidebook. To enhance the learning environment the use of posters, models and videos may be added. Patients will learn how the structural parts of their bodies function and how these functions impact their daily activities, movement, and exercise. Patients will learn how to promote a neutral spine, learn how to manage pain, and identify ways to improve their physical movement in order to promote healthy living. The purpose of this lesson is to expose patients to common physical limitations that impact physical activity. Participants will learn practical ways to adapt and manage aches and pains, and to minimize their effect on regular exercise.  Patients will learn how to maintain good posture while sitting, walking, and lifting.  Balance Training and Fall Prevention  Clinical staff led group instruction and group discussion with PowerPoint presentation and patient guidebook. To enhance the learning environment the use of posters, models and videos may be added. At the conclusion of this workshop, patients will understand the importance of their sensorimotor skills (vision, proprioception, and the vestibular system) in maintaining their ability to balance as they age. Patients will apply a variety of balancing exercises that are appropriate for their current level of function. Patients will understand the common causes for poor balance, possible solutions to these problems, and ways to modify their physical environment in order to minimize their fall risk. The purpose of this lesson is to teach patients about the importance of maintaining balance as they age and ways to minimize their risk of falling.  WORKSHOPS   Nutrition:  Fueling a Healthy Body Clinical staff led group instruction and group discussion with PowerPoint presentation and patient guidebook. To enhance the learning environment the use of posters, models and videos may be added. Patients will review the foundational principles of the Pritikin Eating Plan and understand what constitutes a serving size in each of the food groups. Patients will also learn Pritikin-friendly foods that are better choices when away from home and review make-ahead meal and snack options. Calorie density will be reviewed and applied to three nutrition priorities: weight maintenance, weight loss, and weight gain. The purpose of this lesson is to reinforce (in a group setting) the key concepts around what patients are recommended to eat and how to apply these guidelines when away from home by planning and selecting Pritikin-friendly options. Patients will understand how calorie density may be adjusted for  different weight management goals.  Mindful Eating  Clinical staff led group instruction and group discussion with PowerPoint presentation and patient guidebook. To enhance the learning environment the use of posters, models and videos may be added. Patients will briefly review the concepts of the Pritikin Eating Plan and the importance of low-calorie dense foods. The concept of mindful eating will be introduced as well as the importance of paying attention to internal hunger signals. Triggers for non-hunger eating and techniques for dealing with triggers will be explored. The purpose of this lesson is to provide patients with the opportunity to review the basic principles of the Pritikin Eating Plan, discuss the value of   eating mindfully and how to measure internal cues of hunger and fullness using the Hunger Scale. Patients will also discuss reasons for non-hunger eating and learn strategies to use for controlling emotional eating.  Targeting Your Nutrition Priorities Clinical staff led group instruction and group discussion with PowerPoint presentation and patient guidebook. To enhance the learning environment the use of posters, models and videos may be added. Patients will learn how to determine their genetic susceptibility to disease by reviewing their family history. Patients will gain insight into the importance of diet as part of an overall healthy lifestyle in mitigating the impact of genetics and other environmental insults. The purpose of this lesson is to provide patients with the opportunity to assess their personal nutrition priorities by looking at their family history, their own health history and current risk factors. Patients will also be able to discuss ways of prioritizing and modifying the Pritikin Eating Plan for their highest risk areas  Menu  Clinical staff led group instruction and group discussion with PowerPoint presentation and patient guidebook. To enhance the learning  environment the use of posters, models and videos may be added. Using menus brought in from local restaurants, or printed from online menus, patients will apply the Pritikin dining out guidelines that were presented in the Pritikin Dining Out educational video. Patients will also be able to practice these guidelines in a variety of provided scenarios. The purpose of this lesson is to provide patients with the opportunity to practice hands-on learning of the Pritikin Dining Out guidelines with actual menus and practice scenarios.  Label Reading Clinical staff led group instruction and group discussion with PowerPoint presentation and patient guidebook. To enhance the learning environment the use of posters, models and videos may be added. Patients will review and discuss the Pritikin label reading guidelines presented in Pritikin's Label Reading Educational series video. Using fool labels brought in from local grocery stores and markets, patients will apply the label reading guidelines and determine if the packaged food meet the Pritikin guidelines. The purpose of this lesson is to provide patients with the opportunity to review, discuss, and practice hands-on learning of the Pritikin Label Reading guidelines with actual packaged food labels. Cooking School  Pritikin's Cooking School Workshops are designed to teach patients ways to prepare quick, simple, and affordable recipes at home. The importance of nutrition's role in chronic disease risk reduction is reflected in its emphasis in the overall Pritikin program. By learning how to prepare essential core Pritikin Eating Plan recipes, patients will increase control over what they eat; be able to customize the flavor of foods without the use of added salt, sugar, or fat; and improve the quality of the food they consume. By learning a set of core recipes which are easily assembled, quickly prepared, and affordable, patients are more likely to prepare more healthy  foods at home. These workshops focus on convenient breakfasts, simple entres, side dishes, and desserts which can be prepared with minimal effort and are consistent with nutrition recommendations for cardiovascular risk reduction. Cooking School Workshops are taught by a chef or registered dietitian (RD) who has been trained by the [facility] culinary team. The chef or RD has a clear understanding of the importance of minimizing - if not completely eliminating - added fat, sugar, and sodium in recipes. Throughout the series of Cooking School Workshop sessions, patients will learn about healthy ingredients and efficient methods of cooking to build confidence in their capability to prepare    Cooking School weekly   topics:  Adding Flavor- Sodium-Free  Fast and Healthy Breakfasts  Powerhouse Plant-Based Proteins  Satisfying Salads and Dressings  Simple Sides and Sauces  International Cuisine-Spotlight on the Blue Zones  Delicious Desserts  Savory Soups  Efficiency Cooking - Meals in a Snap  Tasty Appetizers and Snacks  Comforting Weekend Breakfasts  One-Pot Wonders   Fast Evening Meals  Easy Entertaining  Personalizing Your Pritikin Plate  WORKSHOPS   Healthy Mindset (Psychosocial): New Thoughts, New Behaviors Clinical staff led group instruction and group discussion with PowerPoint presentation and patient guidebook. To enhance the learning environment the use of posters, models and videos may be added. Patients will learn and practice techniques for developing effective health and lifestyle goals. Patients will be able to effectively apply the goal setting process learned to develop at least one new personal goal.  The purpose of this lesson is to expose patients to a new skill set of behavior modification techniques such as techniques setting SMART goals, overcoming barriers, and achieving new thoughts and new behaviors.  Managing Moods and Relationships Clinical staff led group  instruction and group discussion with PowerPoint presentation and patient guidebook. To enhance the learning environment the use of posters, models and videos may be added. Patients will learn how emotional and chronic stress factors can impact their health and relationships. They will learn healthy ways to manage their moods and utilize positive coping mechanisms. In addition, [facility] patients will learn ways to improve communication skills. The purpose of this lesson is to expose patients to ways of understanding how one's mood and health are intimately connected. Developing a healthy outlook can help build positive relationships and connections with others. Patients will understand the importance of utilizing effective communication skills that include actively listening and being heard. They will learn and understand the importance of the "4 Cs" and especially Connections in fostering of a Healthy Mind-Set.  Healthy Sleep for a Healthy Heart Clinical staff led group instruction and group discussion with PowerPoint presentation and patient guidebook. To enhance the learning environment the use of posters, models and videos may be added. At the conclusion of this workshop, patients will be able to demonstrate knowledge of the importance of sleep to overall health, well-being, and quality of life. They will understand the symptoms of, and treatments for, common sleep disorders. Patients will also be able to identify daytime and nighttime behaviors which impact sleep, and they will be able to apply these tools to help manage sleep-related challenges. The purpose of this lesson is to provide patients with a general overview of sleep and outline the importance of quality sleep. Patients will learn about a few of the most common sleep disorders. Patients will also be introduced to the concept of "sleep hygiene," and discover ways to self-manage certain sleeping problems through simple daily behavior changes. Finally,  the workshop will motivate patients by clarifying the links between quality sleep and their goals of heart-healthy living.   Recognizing and Reducing Stress Clinical staff led group instruction and group discussion with PowerPoint presentation and patient guidebook. To enhance the learning environment the use of posters, models and videos may be added. At the conclusion of this workshop, patients will be able to understand the types of stress reactions, differentiate between acute and chronic stress, and recognize the impact that chronic stress has on their health. They will also be able to apply different coping mechanisms, such as reframing negative self-talk. Patients will have the opportunity to practice a variety of stress management techniques,   such as deep abdominal breathing, progressive muscle relaxation, and/or guided imagery.  The purpose of this lesson is to educate patients on the role of stress in their lives and to provide healthy techniques for coping with it.  Learning Barriers/Preferences:  Learning Barriers/Preferences - 11/13/21 1212       Learning Barriers/Preferences   Learning Barriers None    Learning Preferences Skilled Demonstration;Pictoral;Verbal Instruction;Individual Instruction;Written Material             Education Topics:  Knowledge Questionnaire Score:  Knowledge Questionnaire Score - 11/13/21 1213       Knowledge Questionnaire Score   Pre Score 18/24             Core Components/Risk Factors/Patient Goals at Admission:  Personal Goals and Risk Factors at Admission - 11/13/21 1213       Core Components/Risk Factors/Patient Goals on Admission    Weight Management Weight Maintenance;Yes    Intervention Weight Management: Provide education and appropriate resources to help participant work on and attain dietary goals.;Weight Management: Develop a combined nutrition and exercise program designed to reach desired caloric intake, while maintaining  appropriate intake of nutrient and fiber, sodium and fats, and appropriate energy expenditure required for the weight goal.    Expected Outcomes Weight Maintenance: Understanding of the daily nutrition guidelines, which includes 25-35% calories from fat, 7% or less cal from saturated fats, less than 268m cholesterol, less than 1.5gm of sodium, & 5 or more servings of fruits and vegetables daily;Understanding recommendations for meals to include 15-35% energy as protein, 25-35% energy from fat, 35-60% energy from carbohydrates, less than 2014mof dietary cholesterol, 20-35 gm of total fiber daily    Diabetes Yes    Intervention Provide education about proper nutrition, including hydration, and aerobic/resistive exercise prescription along with prescribed medications to achieve blood glucose in normal ranges: Fasting glucose 65-99 mg/dL;Provide education about signs/symptoms and action to take for hypo/hyperglycemia.    Expected Outcomes Long Term: Attainment of HbA1C < 7%.;Short Term: Participant verbalizes understanding of the signs/symptoms and immediate care of hyper/hypoglycemia, proper foot care and importance of medication, aerobic/resistive exercise and nutrition plan for blood glucose control.    Heart Failure Yes    Intervention Provide a combined exercise and nutrition program that is supplemented with education, support and counseling about heart failure. Directed toward relieving symptoms such as shortness of breath, decreased exercise tolerance, and extremity edema.    Expected Outcomes Improve functional capacity of life;Short term: Attendance in program 2-3 days a week with increased exercise capacity. Reported lower sodium intake. Reported increased fruit and vegetable intake. Reports medication compliance.;Short term: Daily weights obtained and reported for increase. Utilizing diuretic protocols set by physician.;Long term: Adoption of self-care skills and reduction of barriers for early  signs and symptoms recognition and intervention leading to self-care maintenance.    Hypertension Yes    Intervention Provide education on lifestyle modifcations including regular physical activity/exercise, weight management, moderate sodium restriction and increased consumption of fresh fruit, vegetables, and low fat dairy, alcohol moderation, and smoking cessation.;Monitor prescription use compliance.    Expected Outcomes Short Term: Continued assessment and intervention until BP is < 140/9056mG in hypertensive participants. < 130/72m27m in hypertensive participants with diabetes, heart failure or chronic kidney disease.;Long Term: Maintenance of blood pressure at goal levels.    Lipids Yes    Intervention Provide education and support for participant on nutrition & aerobic/resistive exercise along with prescribed medications to achieve LDL <70mg40mL >40mg.47m  Expected Outcomes Short Term: Participant states understanding of desired cholesterol values and is compliant with medications prescribed. Participant is following exercise prescription and nutrition guidelines.;Long Term: Cholesterol controlled with medications as prescribed, with individualized exercise RX and with personalized nutrition plan. Value goals: LDL < 31m, HDL > 40 mg.    Stress Yes    Intervention Offer individual and/or small group education and counseling on adjustment to heart disease, stress management and health-related lifestyle change. Teach and support self-help strategies.    Expected Outcomes Short Term: Participant demonstrates changes in health-related behavior, relaxation and other stress management skills, ability to obtain effective social support, and compliance with psychotropic medications if prescribed.;Long Term: Emotional wellbeing is indicated by absence of clinically significant psychosocial distress or social isolation.             Core Components/Risk Factors/Patient Goals Review:    Core  Components/Risk Factors/Patient Goals at Discharge (Final Review):    ITP Comments:  ITP Comments     Row Name 11/13/21 0914           ITP Comments Dr. TFransico HimMedical Director, Introduction to [facility] education program / intensive cardiac rehab, intial [facility] orientation packet reviewed with patient.                Comments: Participant attended orientation for the cardiac rehabilitation program on  11/13/2021  to perform initial intake and exercise walk test. [place] was [place]. Completed 6-minute walk test, measurements, initial ITP, and exercise prescription. Vital signs stable. Telemetry-normal sinus rhythm intermittent PVCs paced on demand, patient reported lightheadedness during exercise, he states he did not have breakfast prior to test, patient had to stop walk test at 5 min 5 sec due to fatigue and deconditioning.  LAlbertine GratesRN 8/1/20232:37 PM  Service time was from 0810 to 1120.

## 2021-11-14 ENCOUNTER — Encounter: Payer: Self-pay | Admitting: Internal Medicine

## 2021-11-14 ENCOUNTER — Other Ambulatory Visit (HOSPITAL_COMMUNITY): Payer: Self-pay

## 2021-11-14 NOTE — Telephone Encounter (Signed)
Patient called in checking status of PA for freestyle libre. Please call back

## 2021-11-15 ENCOUNTER — Ambulatory Visit (INDEPENDENT_AMBULATORY_CARE_PROVIDER_SITE_OTHER): Payer: Medicaid Other | Admitting: Internal Medicine

## 2021-11-15 ENCOUNTER — Telehealth: Payer: Self-pay | Admitting: Pharmacy Technician

## 2021-11-15 ENCOUNTER — Other Ambulatory Visit: Payer: Self-pay

## 2021-11-15 ENCOUNTER — Other Ambulatory Visit (HOSPITAL_COMMUNITY): Payer: Self-pay

## 2021-11-15 ENCOUNTER — Encounter: Payer: Self-pay | Admitting: Internal Medicine

## 2021-11-15 VITALS — BP 118/68 | HR 58 | Ht 72.0 in | Wt 161.6 lb

## 2021-11-15 DIAGNOSIS — E1365 Other specified diabetes mellitus with hyperglycemia: Secondary | ICD-10-CM | POA: Diagnosis not present

## 2021-11-15 DIAGNOSIS — E1369 Other specified diabetes mellitus with other specified complication: Secondary | ICD-10-CM

## 2021-11-15 DIAGNOSIS — E139 Other specified diabetes mellitus without complications: Secondary | ICD-10-CM

## 2021-11-15 DIAGNOSIS — E785 Hyperlipidemia, unspecified: Secondary | ICD-10-CM | POA: Diagnosis not present

## 2021-11-15 DIAGNOSIS — E1169 Type 2 diabetes mellitus with other specified complication: Secondary | ICD-10-CM

## 2021-11-15 LAB — POCT GLYCOSYLATED HEMOGLOBIN (HGB A1C): Hemoglobin A1C: 8.7 % — AB (ref 4.0–5.6)

## 2021-11-15 NOTE — Telephone Encounter (Signed)
-----   Message from Kenyon Ana, [place] sent at 11/15/2021  8:41 AM EDT ----- Regarding: Freestyle Libre Can we get an expedited PA started for [place] [facility]. Thank you.

## 2021-11-15 NOTE — Telephone Encounter (Signed)
Patient Advocate Encounter   Received notification from RMA/Office that prior authorization for [facility] 2 (sensors) is required/requested.  PA submitted on 11/15/21 to [facility] via [employer] Confirmation # [id number] W Status is pending  Pharmacy Patient Advocate Fax:  [phone]

## 2021-11-15 NOTE — Patient Instructions (Addendum)
Please continue: - Metformin ER 500 mg 2x a day, with meals - Farxiga 5 mg before b'fast  - Tresiba 20 units daily  Try to use:  - FiAsp 6-10 units before each meal  If you have a snack with >15g of carbs, you may need to take 3-4 units of Fiasp.  Please return in 4 months.

## 2021-11-15 NOTE — Telephone Encounter (Signed)
Patient Advocate Encounter  Prior Authorization has been approved.    PA# Prior Approval [id number] For a year and a total of 24 sensors.   Per Test Claim Patients co-pay is $0.

## 2021-11-15 NOTE — Progress Notes (Signed)
Patient ID: Jason Stevenson, male   DOB: 1985/06/12, 36 y.o.   MRN: [id number]  HPI: Jason Stevenson is a 36 y.o.-year-old male, initially referred by his cardiologist, Dr. Aundra Dubin, returning for follow-up for DM, dx in 2011, but as LADA in 2022, insulin-dependent, uncontrolled, with complications (ischemic cardiomyopathy complicated by systolic CHF, history of CVA -after Covid19 infection, peripheral neuropathy).  Last visit 4 months ago.  Interim history: No increased urination, nausea, chest pain.  He has chronic blurry vision for distance. He is evaluated at [facility] for heart transplant.  He is currently in their cardiac physical therapy program (jut started), to gain more strength before the transplant He was started back on Lasix + potassium MWF. He is currently off his CGM as it needs a PA.  Reviewed HbA1c levels: 06/18/2021: HbA1c 7.8% Lab Results  Component Value Date   HGBA1C 7.7 (H) 04/19/2021   HGBA1C 8.2 (H) 01/29/2021   HGBA1C 8.3 (H) 12/14/2020   HGBA1C >15.5 (H) 09/12/2020   HGBA1C >15.5 (H) 11/26/2019   HGBA1C 14.2 (A) 04/26/2019   HGBA1C 13.8 (A) 01/18/2019   HGBA1C 13.7 (A) 10/12/2018   HGBA1C 12.4 (H) 07/02/2018   HGBA1C 11.6 08/12/2017   At last visit he was on: - Metformin 500 mg 2x a day, with meals - Farxiga 5 mg before b'fast - Novolin 70/30 30 min before a meal 22-25 units before breakfast 14-16 units before dinner   We changed to: - Metformin 500 mg 2x a day, with meals >> diarrhea >> metformin ER 500 mg 2x a day - Farxiga 5 mg before b'fast  - Tresiba 20 units daily  - FiAsp 5-8 units before each meal  Pt checks his sugars >4x a day with his Freestyle Libre CGM:   Previously:   Lowest sugar was 51 >> 57 >> 60 >> 67; he has hypoglycemia awareness at 70.  Highest sugar was 321 >> 281 >> 200s >> 400.  Glucometer:TrueMetrix  Pt's meals are: - Breakfast: fruit (including banana), PB, toast, pancakes - Lunch: pasta, stir-fry with protein,  sandwiches - Dinner: varies - Snacks: fruit, sometimes chips  - no CKD, last BUN/creatinine:  Lab Results  Component Value Date   BUN 7 11/02/2021   BUN 14 10/03/2021   CREATININE 0.77 11/02/2021   CREATININE 0.77 10/03/2021  On Entresto 24/26.  -+ HL; last set of lipids: Lab Results  Component Value Date   CHOL 133 09/21/2021   HDL 47 09/21/2021   LDLCALC 56 09/21/2021   TRIG 149 09/21/2021   CHOLHDL 2.8 09/21/2021  On atorvastatin 80 mg daily, ezetimibe 10 mg daily. Also, Repatha.  - last eye exam was in 2018. No DR. Now that he got insurance, he is planning to schedule this.  - + numbness and tingling in his feet. He was currently referred to a pain clinic and also started on Lyrica, Cymbalta. He had ABIs on 06/22/2021: Normal.  Pt has FH of DM in mother and sister.  He also has HTN.  ROS: + see HPI  Past Medical History:  Diagnosis Date   Chronic systolic CHF (congestive heart failure) ([place]) 06/18/2015   Diabetes mellitus without complication ([place])    Hemorrhagic stroke ([place])    Hyperlipidemia    Hypertension    Nonischemic cardiomyopathy ([place]) Noted as early as 2007   Per chart review (cards consult note 2011), EF of 40% in 2007, down to 20-25% in 2011   Past Surgical History:  Procedure Laterality Date   ICD IMPLANT  N/A 08/27/2021   Procedure: ICD IMPLANT;  Surgeon: Vickie Epley, MD;  Location: [place] [facility];  Service: Cardiovascular;  Laterality: N/A;   None     RIGHT HEART CATH  06/21/2021   [facility]   RIGHT/LEFT HEART CATH AND CORONARY ANGIOGRAPHY N/A 11/02/2020   Procedure: RIGHT/LEFT HEART CATH AND CORONARY ANGIOGRAPHY;  Surgeon: Larey Dresser, MD;  Location: [place] [facility];  Service: Cardiovascular;  Laterality: N/A;   Social History   Socioeconomic History   Marital status: Married    Spouse name: [name]   Number of children: 0   Years of education: 13   Highest education level: Some college, no degree  Occupational  History   Occupation: unemployed  Tobacco Use   Smoking status: Never   Smokeless tobacco: Never  Vaping Use   Vaping Use: Not on file  Substance and Sexual Activity   Alcohol use: Not Currently    Comment: "occasional" when "hanging out with the wrong people" No recent use.   Drug use: Not Currently    Types: Marijuana    Comment: occasional, last 2013   Sexual activity: Not Currently  Other Topics Concern   Not on file  Social History Narrative   Pt lives at home with his wife    Right handed    Caffeine- hardly any   Social Determinants of Health   Financial Resource Strain: High Risk (10/18/2021)   Overall Financial Resource Strain (CARDIA)    Difficulty of Paying Living Expenses: Hard  Food Insecurity: No Food Insecurity (09/14/2020)   Hunger Vital Sign    Worried About Running Out of Food in the Last Year: Never true    Ran Out of Food in the Last Year: Never true  Recent Concern: Food Insecurity - Food Insecurity Present (09/13/2020)   Hunger Vital Sign    Worried About Running Out of Food in the Last Year: Never true    Ran Out of Food in the Last Year: Sometimes true  Transportation Needs: Unmet Transportation Needs (12/15/2020)   PRAPARE - Hydrologist (Medical): No    Lack of Transportation (Non-Medical): Yes  Physical Activity: Not on file  Stress: Not on file  Social Connections: Not on file  Intimate Partner Violence: Not on file   Current Outpatient Medications on File Prior to Visit  Medication Sig Dispense Refill   Accu-Chek Softclix Lancets lancets Use to check blood sugar three times daily. 100 each 3   acetaminophen (TYLENOL) 325 MG tablet Take 1-2 tablets (325-650 mg total) by mouth every 4 (four) hours as needed for mild pain.     atorvastatin (LIPITOR) 80 MG tablet TAKE 1 TABLET (80 MG TOTAL) BY MOUTH DAILY (EVENING) 90 tablet 0   Blood Glucose Monitoring Suppl (ACCU-CHEK GUIDE) w/Device KIT Use to check blood sugar three  times daily. 1 kit 0   Blood Pressure Monitor DEVI Use as directed to check home blood pressure 2-3 times a week 1 Device 0   Continuous Blood Gluc Receiver (FREESTYLE LIBRE 2 READER) DEVI 1 each by Does not apply route daily. (Patient not taking: Reported on 11/13/2021) 1 each 0   Continuous Blood Gluc Sensor (FREESTYLE LIBRE 2 SENSOR) MISC USE EVERY 14 (FOURTEEN) DAYS. (Patient not taking: Reported on 11/13/2021) 2 each 6   dapagliflozin propanediol (FARXIGA) 5 MG TABS tablet Take 1 tablet (5 mg total) by mouth daily before breakfast. 30 tablet 6   digoxin (LANOXIN) 0.125 MG  tablet Take 1 tablet (0.125 mg total) by mouth daily. Please request Cardiologist for future refills. 30 tablet 0   DULoxetine (CYMBALTA) 30 MG capsule TAKE 1 CAPSULE (30 MG TOTAL) BY MOUTH DAILY. 30 capsule 2   Evolocumab (REPATHA SURECLICK) 440 MG/ML SOAJ Inject 140 mg into the skin every 14 (fourteen) days. 2 mL 11   ezetimibe (ZETIA) 10 MG tablet TAKE 1 TABLET (10 MG TOTAL) BY MOUTH DAILY. 30 tablet 0   furosemide (LASIX) 20 MG tablet Take 1 tablet (20 mg total) by mouth 3 (three) times a week. Mondays Wednesdays and Friday 30 tablet 11   glucose blood (ACCU-CHEK GUIDE) test strip Use to check blood sugar three times daily. 100 each 2   insulin aspart (FIASP FLEXTOUCH) 100 UNIT/ML FlexTouch Pen Inject 5-8 Units into the skin 3 (three) times daily before meals. 30 mL 3   insulin degludec (TRESIBA FLEXTOUCH) 200 UNIT/ML FlexTouch Pen Inject 20 Units into the skin daily. 30 mL 3   Insulin Pen Needle 32G X 4 MM MISC Use to inject insulin. 200 each 2   Insulin Syringe-Needle U-100 (RELION INSULIN SYRINGE) 31G X 15/64" 0.3 ML MISC Use to inject insulin daily. 100 each 11   ivabradine (CORLANOR) 5 MG TABS tablet Take 1 tablet (5 mg total) by mouth 2 (two) times daily with a meal. 60 tablet 6   levETIRAcetam (KEPPRA) 750 MG tablet Take 2 tablets (1,500 mg total) by mouth 2 (two) times daily. 360 tablet 4   Menthol, Topical Analgesic,  (ICY HOT BACK EX) Apply 1 application topically as needed (neuropathy). Icy hot cream     metFORMIN (GLUCOPHAGE-XR) 500 MG 24 hr tablet Take 1 tablet (500 mg total) by mouth 2 (two) times daily with a meal. 180 tablet 3   metoprolol (TOPROL-XL) 200 MG 24 hr tablet TAKE 0.5 TABLETS (100 MG TOTAL) BY MOUTH DAILY. TAKE WITH OR IMMEDIATELY FOLLOWING A MEAL (EVENING) 30 tablet 0   Oxcarbazepine (TRILEPTAL) 300 MG tablet Take 2 tablets (600 mg total) by mouth 2 (two) times daily. 360 tablet 4   phenytoin (DILANTIN) 100 MG ER capsule Take 1 capsule (100 mg total) by mouth 3 (three) times daily. 270 capsule 4   potassium chloride SA (KLOR-CON M) 20 MEQ tablet Take 1 tablet (20 mEq total) by mouth 3 (three) times a week. Take every Mon, Wed and Fri with Lasix 30 tablet 4   pregabalin (LYRICA) 300 MG capsule TAKE 1 CAPSULE (300 MG TOTAL) BY MOUTH AT BEDTIME. 30 capsule 2   sacubitril-valsartan (ENTRESTO) 49-51 MG Take 1 tablet by mouth 2 (two) times daily. 60 tablet 3   spironolactone (ALDACTONE) 25 MG tablet Take 1 tablet (25 mg total) by mouth at bedtime. 90 tablet 3   warfarin (COUMADIN) 5 MG tablet Take 1-1.5 tablets (5-7.5 mg total) by mouth daily as directed by Coumadin clinic 40 tablet 3   No current facility-administered medications on file prior to visit.   No Known Allergies Family History  Problem Relation Age of Onset   Stroke Mother    Diabetes Mother    Hypertension Mother    Stroke Maternal Aunt    Heart attack Neg Hx    PE: BP 118/68 (BP Location: Right Arm, Patient Position: Sitting, Cuff Size: Normal)   Pulse (!) 58   Ht 6' (1.829 m)   Wt 161 lb 9.6 oz (73.3 kg)   SpO2 99%   BMI 21.92 kg/m  Wt Readings from Last 3 Encounters:  11/15/21  161 lb 9.6 oz (73.3 kg)  11/13/21 158 lb 11.7 oz (72 kg)  11/08/21 159 lb 9.6 oz (72.4 kg)   Constitutional: normal weight, in NAD Eyes: no exophthalmos ENT: moist mucous membranes, no masses palpated in neck, no cervical  lymphadenopathy Cardiovascular: RRR, No MRG Respiratory: CTA B Musculoskeletal: no deformities Skin: moist, warm, no rashes Neurological: no tremor with outstretched hands Diabetic Foot Exam - Simple   Simple Foot Form Diabetic Foot exam was performed with the following findings: Yes 11/15/2021  8:58 AM  Visual Inspection No deformities, no ulcerations, no other skin breakdown bilaterally: Yes Sensation Testing Intact to touch and monofilament testing bilaterally: Yes Pulse Check Posterior Tibialis and Dorsalis pulse intact bilaterally: Yes Comments    ASSESSMENT: 1. LADA, insulin-dependent, uncontrolled, with complications - iCMP, systolic CHF, with LV thrombus - on Coumadin - h/o CVA 08/2020 - PN  Component     Latest Ref Rng & Units 02/05/2021  Glutamic Acid Decarb Ab     0.0 - 5.0 U/mL 20.6 (H)  Islet Cell Ab     Neg:<1:1 Negative   Component     Latest Ref Rng & Units 04/19/2021  C-Peptide     0.80 - 3.85 ng/mL 0.49 (L)  Glucose     65 - 99 mg/dL 78   His C-peptide is low, confirming insulin deficiency >> he has a diagnosis of LADA, behaving more like type 1 diabetes.  2. HL  PLAN:  1. Patient with longstanding, uncontrolled, insulin-dependent diabetes, on basal-bolus insulin regimen, previously on premixed insulin regimen.  HbA1c improved after making this change, with the latest HbA1c being 7.8%.  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  We discussed that this is behaving just like type 1 diabetes for him.  We also have him on metformin and SGLT2 inhibitor -due to his CHF. -At last visit, sugars were fluctuating in the normal range with higher blood sugars after breakfast and dinner and occasionally overnight.  Upon questioning, he was taking a fixed, low, dose of Fiasp before each meal.  I advised him to vary the dose of Fiasp based on the size and consistency of his meals.  We discussed that he may need 8 units  before a larger meal or a meal with more carbs or if he has dessert after the meal.  Also, he was injecting Fiasp after meals and I advised him to inject it before.  We changed from metformin IR to ER due to abdominal pain and diarrhea. CGM interpretation: -At today's visit, we reviewed his CGM downloads: It appears that 65% of values are in target range (goal >70%), while 35% are higher than 180 (goal <25%), and 0% are lower than 70 (goal <4%).  The calculated average blood sugar is 173.  The projected HbA1c for the next 3 months (GMI) is 7.4%. -Reviewing the CGM trends, it appears that sugars are fluctuating within the target range in the morning, but they are increasing with every meal, especially lunch and in the evening.  Upon questioning, he has a late night snack and sugars are usually higher after this and persist being elevated throughout the night.  At this visit, I advised him that he can increase the dose of Fiasp especially before larger meals.  He admits that he has a better appetite and eats more than before.  I advised him that he can increase the dose up to 10 units.  He may need even more insulin, depending on the sugars.  Also, I did advise him to try to stop the snack at night, but if he continues with it, then he needs to cover it with 3 to 4 units of Fiasp if it contains more than 50 g of carbs.  He agrees with the plan. -I am hopeful that his sugars will continue to improve especially since he has just started the cardiac rehab program at [facility]. -At today's visit he is off the CGM.  We will submit the preauthorization form for this ASAP. - I suggested to:  Patient Instructions  Please continue: - Metformin ER 500 mg 2x a day, with meals - Farxiga 5 mg before b'fast  - Tresiba 20 units daily  Try to use:  - FiAsp 6-10 units before each meal  If you have a snack with >15g of carbs, you may need to take 3-4 units of Fiasp.  Please return in 4 months.  - we checked his HbA1c:  8.7% (higher) -possibly due to higher blood sugars in the last 2 weeks after coming off the CGM - advised to check sugars at different times of the day - 4x a day, rotating check times - advised for yearly eye exams >> he is not UTD - return to clinic in 4 months  2. HL -Reviewed his lipid panel from 09/2021: Fractions at goal: Lab Results  Component Value Date   CHOL 133 09/21/2021   HDL 47 09/21/2021   LDLCALC 56 09/21/2021   TRIG 149 09/21/2021   CHOLHDL 2.8 09/21/2021  -He continues on atorvastatin 80 mg daily, Zetia 10 mg daily, and Repatha, without side effects  Philemon Kingdom, MD PhD [facility] Endocrinology

## 2021-11-15 NOTE — Telephone Encounter (Addendum)
Patient aware the PA was completed.  Will inform Maralyn Sago, EMT-Paramedic. Message was left on voicemail.

## 2021-11-16 ENCOUNTER — Ambulatory Visit (HOSPITAL_COMMUNITY)
Admission: RE | Admit: 2021-11-16 | Discharge: 2021-11-16 | Disposition: A | Discharge status: Home / Self Care | Payer: Medicaid Other | Source: Ambulatory Visit | Attending: Cardiology | Admitting: Cardiology

## 2021-11-16 DIAGNOSIS — I5022 Chronic systolic (congestive) heart failure: Secondary | ICD-10-CM | POA: Diagnosis not present

## 2021-11-16 LAB — BASIC METABOLIC PANEL WITH GFR
Anion gap: 4 — ABNORMAL LOW (ref 5–15)
BUN: 7 mg/dL (ref 6–20)
CO2: 32 mmol/L (ref 22–32)
Calcium: 8.7 mg/dL — ABNORMAL LOW (ref 8.9–10.3)
Chloride: 105 mmol/L (ref 98–111)
Creatinine, Ser: 0.71 mg/dL (ref 0.61–1.24)
GFR, Estimated: >60 mL/min
Glucose, Bld: 229 mg/dL — ABNORMAL HIGH (ref 70–99)
Potassium: 4.4 mmol/L (ref 3.5–5.1)
Sodium: 141 mmol/L (ref 135–145)

## 2021-11-16 MED ORDER — REPATHA SURECLICK 140 MG/ML ~~LOC~~ SOAJ: 140.0000 mg | SUBCUTANEOUS | 11 refills | Status: DC

## 2021-11-16 NOTE — Addendum Note (Signed)
Encounter addended by: Learta Codding, CMA on: 11/16/2021 9:23 AM  Actions taken: Charge Capture section accepted

## 2021-11-16 NOTE — Telephone Encounter (Signed)
Prior authorization approved for Repatha through 11/16/22. Pt made aware. Rx sent to pharmacy.

## 2021-11-19 ENCOUNTER — Ambulatory Visit (INDEPENDENT_AMBULATORY_CARE_PROVIDER_SITE_OTHER): Payer: Medicaid Other | Admitting: *Deleted

## 2021-11-19 ENCOUNTER — Encounter (HOSPITAL_COMMUNITY)
Admission: RE | Admit: 2021-11-19 | Discharge: 2021-11-19 | Disposition: A | Discharge status: Home / Self Care | Payer: Medicaid Other | Source: Ambulatory Visit | Attending: Cardiology | Admitting: Cardiology

## 2021-11-19 DIAGNOSIS — I639 Cerebral infarction, unspecified: Secondary | ICD-10-CM

## 2021-11-19 DIAGNOSIS — Z7901 Long term (current) use of anticoagulants: Secondary | ICD-10-CM

## 2021-11-19 DIAGNOSIS — I513 Intracardiac thrombosis, not elsewhere classified: Secondary | ICD-10-CM

## 2021-11-19 DIAGNOSIS — I5022 Chronic systolic (congestive) heart failure: Secondary | ICD-10-CM | POA: Diagnosis not present

## 2021-11-19 DIAGNOSIS — Z5181 Encounter for therapeutic drug level monitoring: Secondary | ICD-10-CM | POA: Diagnosis not present

## 2021-11-19 LAB — GLUCOSE, CAPILLARY
Glucose-Capillary: 165 mg/dL — ABNORMAL HIGH (ref 70–99)
Glucose-Capillary: 194 mg/dL — ABNORMAL HIGH (ref 70–99)

## 2021-11-19 LAB — POCT INR: INR: 1.7 — AB (ref 2.0–3.0)

## 2021-11-19 NOTE — Progress Notes (Signed)
Daily Session Note  Patient Details  Name: Jason Stevenson MRN: [id number] Date of Birth: 1985-07-12 Referring Provider:   Flowsheet Row CARDIAC REHAB PHASE II ORIENTATION from 11/13/2021 in [place]  Referring Provider Rowland Lathe MD       Encounter Date: 11/19/2021  Check In:  Session Check In - 11/19/21 0659       Check-In   Supervising physician immediately available to respond to emergencies [facility] immediately available    Physician(s) Dr. [place]    Location [facility]    Staff Present Albertine Grates, Cathleen Fears, MS, ACSM-CEP, Exercise Physiologist;Carlette Wilber Oliphant, RN, BSN;Jetta Walker BS, ACSM-CEP, Exercise Physiologist   Albertine Grates RN   Virtual Visit No    Medication changes reported     No    Fall or balance concerns reported    No    Tobacco Cessation No Change    Current number of cigarettes/nicotine per day     0    Warm-up and Cool-down Not performed (comment)   cardiac rehab orientation   Resistance Training Performed Yes    VAD Patient? No    PAD/SET Patient? No      Pain Assessment   Currently in Pain? No/denies    Pain Score 0-No pain    Multiple Pain Sites No             Capillary Blood Glucose: Results for orders placed or performed during the hospital encounter of 11/19/21 (from the past 24 hour(s))  Glucose, capillary     Status: Abnormal   Collection Time: 11/19/21  7:04 AM  Result Value Ref Range   Glucose-Capillary 165 (H) 70 - 99 mg/dL  Glucose, capillary     Status: Abnormal   Collection Time: 11/19/21  7:51 AM  Result Value Ref Range   Glucose-Capillary 194 (H) 70 - 99 mg/dL      Social History   Tobacco Use  Smoking Status Never  Smokeless Tobacco Never    Goals Met:  Exercise tolerated well No report of concerns or symptoms today Strength training completed today  Goals Unmet:  Not Applicable  Comments: Pt started Traditional cardiac rehab today. Pt  tolerated light exercise without difficulty. VSS, telemetry-NSR with occasional PVCs.  Medication list reconciled. Pt denies barriers to medication compliance.  PSYCHOSOCIAL ASSESSMENT:  PHQ-7. Pt exhibits positive coping skills, hopeful outlook with supportive family. No psychosocial needs identified at this time, no psychosocial interventions necessary. Pt enjoys Programmer, applications, movies, drumming, and church. Pt oriented to exercise equipment and routine. Understanding verbalized.    Dr. Fransico Him is Medical Director for Cardiac Rehab at [place] RN 11/19/2021 8:22 AM

## 2021-11-19 NOTE — Patient Instructions (Addendum)
Description   Today take 1.5 tablets of warfarin then continue taking Warfarin 1.5 tablets daily except for 1 tablet on Mondays, Wednesday and Fridays. Stay consistent with leafy veggies. Recheck INR in 3 weeks. Coumadin Clinic [phone] or [phone]

## 2021-11-19 NOTE — Progress Notes (Addendum)
QUALITY OF LIFE SCORE REVIEW Pt completed Quality of Life survey as a participant in Cardiac Rehab.  Scores 19.0 or below are considered low.  Pt score very low in several areas Overall 15.69, Health and Function 15.23, socioeconomic 8.29, physiological and spiritual 20.57, family 21.0. Patient quality of life slightly altered by physical constraints which limits ability to perform as prior to recent cardiac illness. Offered emotional support and reassurance.  Will continue to monitor and intervene as necessary.   Newell Coral RN 11/19/2021 7:46 AM

## 2021-11-20 ENCOUNTER — Other Ambulatory Visit (HOSPITAL_COMMUNITY): Payer: Self-pay

## 2021-11-20 ENCOUNTER — Telehealth: Payer: Self-pay | Admitting: Pharmacy Technician

## 2021-11-20 NOTE — Telephone Encounter (Addendum)
Patient Advocate Encounter   Received notification from [facility] that prior authorization for Jason Stevenson is required/requested.   PA started on 11/20/21 Medicaid via [place] [employer] COnfirmation # [id number] W Patient Advocate Encounter  Prior Authorization has been approved.    Per Test Claim Patients co-pay is $4.   (Dr. Elvera Lennox, disregard previous msg if you are able to see it, abt clinical ?s) I went ahead and submitted what I had and they approved it.

## 2021-11-21 ENCOUNTER — Encounter (HOSPITAL_COMMUNITY)
Admission: RE | Admit: 2021-11-21 | Discharge: 2021-11-21 | Disposition: A | Discharge status: Home / Self Care | Payer: Medicaid Other | Source: Ambulatory Visit | Attending: Cardiology | Admitting: Cardiology

## 2021-11-21 ENCOUNTER — Other Ambulatory Visit: Payer: Self-pay

## 2021-11-21 ENCOUNTER — Encounter (HOSPITAL_COMMUNITY): Payer: Self-pay | Admitting: Emergency Medicine

## 2021-11-21 ENCOUNTER — Emergency Department (HOSPITAL_COMMUNITY): Payer: Medicaid Other

## 2021-11-21 ENCOUNTER — Emergency Department (HOSPITAL_COMMUNITY)
Admission: EM | Admit: 2021-11-21 | Discharge: 2021-11-21 | Disposition: A | Discharge status: Home / Self Care | Payer: Medicaid Other | Attending: Emergency Medicine | Admitting: Emergency Medicine

## 2021-11-21 DIAGNOSIS — Z794 Long term (current) use of insulin: Secondary | ICD-10-CM | POA: Insufficient documentation

## 2021-11-21 DIAGNOSIS — E119 Type 2 diabetes mellitus without complications: Secondary | ICD-10-CM | POA: Diagnosis not present

## 2021-11-21 DIAGNOSIS — R270 Ataxia, unspecified: Secondary | ICD-10-CM | POA: Diagnosis not present

## 2021-11-21 DIAGNOSIS — R531 Weakness: Secondary | ICD-10-CM | POA: Diagnosis not present

## 2021-11-21 DIAGNOSIS — R0602 Shortness of breath: Secondary | ICD-10-CM | POA: Insufficient documentation

## 2021-11-21 DIAGNOSIS — Z79899 Other long term (current) drug therapy: Secondary | ICD-10-CM | POA: Diagnosis not present

## 2021-11-21 DIAGNOSIS — G9389 Other specified disorders of brain: Secondary | ICD-10-CM | POA: Diagnosis not present

## 2021-11-21 DIAGNOSIS — R001 Bradycardia, unspecified: Secondary | ICD-10-CM | POA: Diagnosis not present

## 2021-11-21 DIAGNOSIS — Z7901 Long term (current) use of anticoagulants: Secondary | ICD-10-CM | POA: Diagnosis not present

## 2021-11-21 DIAGNOSIS — I110 Hypertensive heart disease with heart failure: Secondary | ICD-10-CM | POA: Insufficient documentation

## 2021-11-21 DIAGNOSIS — I5022 Chronic systolic (congestive) heart failure: Secondary | ICD-10-CM

## 2021-11-21 DIAGNOSIS — Z7984 Long term (current) use of oral hypoglycemic drugs: Secondary | ICD-10-CM | POA: Diagnosis not present

## 2021-11-21 DIAGNOSIS — R42 Dizziness and giddiness: Secondary | ICD-10-CM | POA: Diagnosis not present

## 2021-11-21 LAB — COMPREHENSIVE METABOLIC PANEL
ALT: 59 U/L — ABNORMAL HIGH (ref 0–44)
AST: 37 U/L (ref 15–41)
Albumin: 3.9 g/dL (ref 3.5–5.0)
Alkaline Phosphatase: 98 U/L (ref 38–126)
Anion gap: 7 (ref 5–15)
BUN: 9 mg/dL (ref 6–20)
CO2: 31 mmol/L (ref 22–32)
Calcium: 9.2 mg/dL (ref 8.9–10.3)
Chloride: 103 mmol/L (ref 98–111)
Creatinine, Ser: 0.84 mg/dL (ref 0.61–1.24)
GFR, Estimated: >60 mL/min (ref >60)
Glucose, Bld: 194 mg/dL — ABNORMAL HIGH (ref 70–99)
Potassium: 4.5 mmol/L (ref 3.5–5.1)
Sodium: 141 mmol/L (ref 135–145)
Total Bilirubin: 0.3 mg/dL (ref 0.3–1.2)
Total Protein: 7 g/dL (ref 6.5–8.1)

## 2021-11-21 LAB — CBC WITH DIFFERENTIAL/PLATELET
Abs Immature Granulocytes: 0.01 10*3/uL (ref 0.00–0.07)
Basophils Absolute: 0 10*3/uL (ref 0.0–0.1)
Basophils Relative: 1 %
Eosinophils Absolute: 0.1 10*3/uL (ref 0.0–0.5)
Eosinophils Relative: 2 %
HCT: 44.2 % (ref 39.0–52.0)
Hemoglobin: 14.5 g/dL (ref 13.0–17.0)
Immature Granulocytes: 0 %
Lymphocytes Relative: 47 %
Lymphs Abs: 1.6 10*3/uL (ref 0.7–4.0)
MCH: 29.3 pg (ref 26.0–34.0)
MCHC: 32.8 g/dL (ref 30.0–36.0)
MCV: 89.3 fL (ref 80.0–100.0)
Monocytes Absolute: 0.2 10*3/uL (ref 0.1–1.0)
Monocytes Relative: 7 %
Neutro Abs: 1.5 10*3/uL — ABNORMAL LOW (ref 1.7–7.7)
Neutrophils Relative %: 43 %
Platelets: 217 10*3/uL (ref 150–400)
RBC: 4.95 MIL/uL (ref 4.22–5.81)
RDW: 11.9 % (ref 11.5–15.5)
WBC: 3.4 10*3/uL — ABNORMAL LOW (ref 4.0–10.5)
nRBC: 0 % (ref 0.0–0.2)

## 2021-11-21 LAB — TSH: TSH: 0.904 u[IU]/mL (ref 0.350–4.500)

## 2021-11-21 LAB — TROPONIN I (HIGH SENSITIVITY)
Troponin I (High Sensitivity): 12 ng/L (ref <18)
Troponin I (High Sensitivity): 10 ng/L (ref <18)

## 2021-11-21 LAB — GLUCOSE, CAPILLARY: Glucose-Capillary: 140 mg/dL — ABNORMAL HIGH (ref 70–99)

## 2021-11-21 LAB — BRAIN NATRIURETIC PEPTIDE: B Natriuretic Peptide: 36.8 pg/mL (ref 0.0–100.0)

## 2021-11-21 MED ORDER — IOHEXOL 350 MG/ML SOLN
150.0000 mL | Freq: Once | INTRAVENOUS | Status: AC | PRN
  Administered 2021-11-21: 150 mL via INTRAVENOUS

## 2021-11-21 NOTE — ED Triage Notes (Signed)
Pt was at cardiac rehab this morning and they felt he should come to the ED to be evaluated for his weakness, lethargy, and "leaning to one side"  Pt states he was unsteady on his feet  Has felt this way since yesterday  "just tired"

## 2021-11-21 NOTE — ED Provider Notes (Signed)
Ataxia+ "fog" since yesterday. On coumadin for LV thrombus. Hx of stroke. Episodes of intermittent ataxia since previous stroke worse today  CTA H/N negative. Awaiting MRI. If negative safe for dc.  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  Patient became extremely upset that he would be unable to receive the MRI tonight as hea ahd already been waiting >8 hrs. Patient unwilling to wait any longer and is leaving. Discussed risks v. Benefits. Patient aware and has capacity.   Arthor Captain, PA-C 11/25/21 2303    Virgina Norfolk, DO 11/26/21 [phone]

## 2021-11-21 NOTE — Significant Event (Addendum)
Rapid Response Event Note   Reason for Call :  "Leaning left" "lightheaded, dizzy"  Initial Focused Assessment:  Pt in cardiac rehab, sitting in evaluation room. He endorses that these symptoms have been intermittent over the last year. He informs me he saw a neurologist for these symptoms in the past.   Pt alert, oriented. Obeys commands. Speech is clear, soft spoken. EOMI, no visual field deficits. No drift in bilateral upper extremities. Sensory intact and equal bilaterally. Ataxia negative in bilateral upper extremities. Pt walked with minimal leaning noted to the left.   VS: BP 112/70 sitting, HR 70, RR 14 CBG: 140  Interventions:  Pt escorted to ED  Plan of Care:  Pt taken to ED for further evaluation  Event Summary:  Call Time: 0718 Arrival Time: 0724 End Time: 0745  Jennye Moccasin, RN

## 2021-11-21 NOTE — ED Notes (Signed)
Pt refused DC vitals at this time

## 2021-11-21 NOTE — ED Provider Notes (Signed)
[facility] EMERGENCY DEPARTMENT Provider Note   CSN: [id number] Arrival date & time: 11/21/21  0741     History  Chief Complaint  Patient presents with   Weakness    Jason Stevenson is a 36 y.o. male.  36 year old male with past medical history significant for CHF presents today for evaluation of generalized weakness, and disequilibrium.  This has been ongoing since yesterday.  Denies unilateral weakness, numbness, difficulty with speech.  He does endorse blurry vision but thinks this might of been going on prior to yesterday.  Patient presented to cardiac rehab today and prior to his session he was evaluated for above-mentioned symptoms and was encouraged to come into the emergency room for further evaluation.  He denies chest pain, but endorses shortness of breath.  Reports compliance with all of his home medications.  After further history patient states he occasionally gets these episodes of imbalance.  They typically last for a day or so.  He does not know what brings them on or what makes them better.  However he states this episode is different and that it was also associated with the sensation of being in a fog.  Given his history he was concerned he might of had another stroke.  The history is provided by the patient. No language interpreter was used.       Home Medications Prior to Admission medications   Medication Sig Start Date End Date Taking? Authorizing Provider  Accu-Chek Softclix Lancets lancets Use to check blood sugar three times daily. 04/05/21   Ladell Pier, MD  acetaminophen (TYLENOL) 325 MG tablet Take 1-2 tablets (325-650 mg total) by mouth every 4 (four) hours as needed for mild pain. 08/28/21   Shirley Friar, PA-C  atorvastatin (LIPITOR) 80 MG tablet TAKE 1 TABLET (80 MG TOTAL) BY MOUTH DAILY (EVENING) 10/10/21   Ladell Pier, MD  Blood Glucose Monitoring Suppl (ACCU-CHEK GUIDE) w/Device KIT Use to check blood sugar  three times daily. 04/05/21   Ladell Pier, MD  Blood Pressure Monitor DEVI Use as directed to check home blood pressure 2-3 times a week 10/12/18   Ladell Pier, MD  Continuous Blood Gluc Receiver (FREESTYLE LIBRE 2 READER) DEVI 1 each by Does not apply route daily. 04/19/21   Philemon Kingdom, MD  Continuous Blood Gluc Sensor (FREESTYLE LIBRE 2 SENSOR) MISC USE EVERY 14 (FOURTEEN) DAYS. 11/08/21   Ladell Pier, MD  dapagliflozin propanediol (FARXIGA) 5 MG TABS tablet Take 1 tablet (5 mg total) by mouth daily before breakfast. 10/03/21   Larey Dresser, MD  digoxin (LANOXIN) 0.125 MG tablet Take 1 tablet (0.125 mg total) by mouth daily. Please request Cardiologist for future refills. 11/08/21   Ladell Pier, MD  DULoxetine (CYMBALTA) 30 MG capsule TAKE 1 CAPSULE (30 MG TOTAL) BY MOUTH DAILY. 10/04/21   Kirsteins, Luanna Salk, MD  Evolocumab (REPATHA SURECLICK) 867 MG/ML SOAJ Inject 140 mg into the skin every 14 (fourteen) days. 11/16/21   Larey Dresser, MD  ezetimibe (ZETIA) 10 MG tablet TAKE 1 TABLET (10 MG TOTAL) BY MOUTH DAILY. 10/03/21   Ladell Pier, MD  furosemide (LASIX) 20 MG tablet Take 1 tablet (20 mg total) by mouth 3 (three) times a week. Mondays Wednesdays and Friday 11/02/21   Rafael Bihari, FNP  glucose blood (ACCU-CHEK GUIDE) test strip Use to check blood sugar three times daily. 04/05/21   Ladell Pier, MD  insulin aspart (FIASP FLEXTOUCH) 100 UNIT/ML  FlexTouch Pen Inject 5-8 Units into the skin 3 (three) times daily before meals. 04/19/21   Philemon Kingdom, MD  insulin degludec (TRESIBA FLEXTOUCH) 200 UNIT/ML FlexTouch Pen Inject 20 Units into the skin daily. 04/19/21   Philemon Kingdom, MD  Insulin Pen Needle 32G X 4 MM MISC Use to inject insulin. 04/05/21   Ladell Pier, MD  Insulin Syringe-Needle U-100 (RELION INSULIN SYRINGE) 31G X 15/64" 0.3 ML MISC Use to inject insulin daily. 07/13/18   Ladell Pier, MD  ivabradine (CORLANOR) 5 MG  TABS tablet Take 1 tablet (5 mg total) by mouth 2 (two) times daily with a meal. 07/25/21   [place], Maricela Bo, FNP  levETIRAcetam (KEPPRA) 750 MG tablet Take 2 tablets (1,500 mg total) by mouth 2 (two) times daily. 06/26/21 09/19/22  Penumalli, Earlean Polka, MD  Menthol, Topical Analgesic, (ICY HOT BACK EX) Apply 1 application topically as needed (neuropathy). Icy hot cream    [provider]  metFORMIN (GLUCOPHAGE-XR) 500 MG 24 hr tablet Take 1 tablet (500 mg total) by mouth 2 (two) times daily with a meal. 07/17/21   Philemon Kingdom, MD  metoprolol (TOPROL-XL) 200 MG 24 hr tablet TAKE 0.5 TABLETS (100 MG TOTAL) BY MOUTH DAILY. TAKE WITH OR IMMEDIATELY FOLLOWING A MEAL (EVENING) 10/10/21   Ladell Pier, MD  Oxcarbazepine (TRILEPTAL) 300 MG tablet Take 2 tablets (600 mg total) by mouth 2 (two) times daily. 06/26/21 09/19/22  Penumalli, Earlean Polka, MD  phenytoin (DILANTIN) 100 MG ER capsule Take 1 capsule (100 mg total) by mouth 3 (three) times daily. 06/26/21 09/19/22  Penumalli, Earlean Polka, MD  potassium chloride SA (KLOR-CON M) 20 MEQ tablet Take 1 tablet (20 mEq total) by mouth 3 (three) times a week. Take every Mon, Wed and Fri with Lasix 11/02/21   Rafael Bihari, FNP  pregabalin (LYRICA) 300 MG capsule TAKE 1 CAPSULE (300 MG TOTAL) BY MOUTH AT BEDTIME. 10/11/21   Kirsteins, Luanna Salk, MD  sacubitril-valsartan (ENTRESTO) 49-51 MG Take 1 tablet by mouth 2 (two) times daily. 09/21/21   Larey Dresser, MD  spironolactone (ALDACTONE) 25 MG tablet Take 1 tablet (25 mg total) by mouth at bedtime. 04/05/21   Ladell Pier, MD  warfarin (COUMADIN) 5 MG tablet Take 1-1.5 tablets (5-7.5 mg total) by mouth daily as directed by Coumadin clinic 09/01/21   Shirley Friar, PA-C      Allergies    Patient has no known allergies.    Review of Systems   Review of Systems  Constitutional:  Positive for fatigue. Negative for chills and fever.  Respiratory:  Positive for shortness of breath. Negative  for cough.   Cardiovascular:  Negative for chest pain, palpitations and leg swelling.  Gastrointestinal:  Negative for abdominal pain, nausea and vomiting.  Genitourinary:  Negative for dysuria.  Neurological:  Positive for dizziness (Disequilibrium) and weakness. Negative for light-headedness and headaches.  All other systems reviewed and are negative.   Physical Exam Updated Vital Signs BP 118/79   Pulse (!) 56   Temp 98 F (36.7 C) (Oral)   Resp 19   SpO2 100%  Physical Exam Vitals and nursing note reviewed.  Constitutional:      General: He is not in acute distress.    Appearance: Normal appearance. He is not ill-appearing.  HENT:     Head: Normocephalic and atraumatic.     Nose: Nose normal.  Eyes:     General: No scleral icterus.    Extraocular  Movements: Extraocular movements intact.     Conjunctiva/sclera: Conjunctivae normal.  Cardiovascular:     Rate and Rhythm: Regular rhythm. Bradycardia present.     Pulses: Normal pulses.  Pulmonary:     Effort: Pulmonary effort is normal. No respiratory distress.     Breath sounds: Normal breath sounds. No wheezing or rales.  Abdominal:     General: There is no distension.     Tenderness: There is no abdominal tenderness.  Musculoskeletal:        General: Normal range of motion.     Cervical back: Normal range of motion.  Skin:    General: Skin is warm and dry.  Neurological:     General: No focal deficit present.     Mental Status: He is alert. Mental status is at baseline.     Comments: Cranial nerves III through XII intact.  Without pronator drift.  Full range of motion in bilateral upper and lower extremities with 5/5 strength in extensor and flexor muscle groups.  Tongue midline.  Without facial droop.  Normal speech.     ED Results / Procedures / Treatments   Labs (all labs ordered are listed, but only abnormal results are displayed) Labs Reviewed  COMPREHENSIVE METABOLIC PANEL - Abnormal; Notable for the  following components:      Result Value   Glucose, Bld 194 (*)    ALT 59 (*)    All other components within normal limits  CBC WITH DIFFERENTIAL/PLATELET - Abnormal; Notable for the following components:   WBC 3.4 (*)    Neutro Abs 1.5 (*)    All other components within normal limits  BRAIN NATRIURETIC PEPTIDE  CBG MONITORING, ED  TROPONIN I (HIGH SENSITIVITY)  TROPONIN I (HIGH SENSITIVITY)    EKG EKG Interpretation  Date/Time:  Wednesday November 21 2021 08:46:41 EDT Ventricular Rate:  61 PR Interval:  168 QRS Duration: 96 QT Interval:  370 QTC Calculation: 372 R Axis:   112 Text Interpretation: Normal sinus rhythm Right axis deviation ST & T wave abnormality, consider inferior ischemia Abnormal ECG No significant change since last tracing Confirmed by Gareth Morgan [phone]) on 11/21/2021 12:00:27 PM  Radiology No results found.  Procedures Procedures    Medications Ordered in ED Medications - No data to display  ED Course/ Medical Decision Making/ A&P                           Medical Decision Making Amount and/or Complexity of Data Reviewed Labs: ordered. Radiology: ordered.   Medical Decision Making / ED Course   This patient presents to the ED for concern of ataxia, this involves an extensive number of treatment options, and is a complaint that carries with it a high risk of complications and morbidity.  The differential diagnosis includes CVA, orthostasis, neuropathy  MDM: 36 year old male with past medical history significant for CHF presents today for evaluation of ataxia, and generalized weakness ongoing since yesterday.  Without unilateral weakness, numbness, or difficulty with speech.  Endorses blurry vision but he believes this might of been going on for quite some time.  He was referred from cardiac rehab.  He is without chest pain.  Reports compliance with his home medications.  Recently had INR checked on 8/7.  It was 1.7 at that time.  Workup so far  shows mild leukopenia at 3.4, normal hemoglobin.  CMP shows glucose 194 otherwise without acute findings.  BNP of 36.  Initial troponin  of 12, repeat 10.  EKG without acute ischemic changes.  Chest x-ray without evidence of acute cardiopulmonary process.  Cardiopulmonary process.  CT angio head and neck without acute finding to explain patient's symptoms.  Given ataxia will evaluate with MRI especially given his history of prior CVA, LV thrombus. I did ambulate patient in the room during my exam.  He reports some improvement in his symptoms. Patient also complained of shortness of breath.  Chest x-ray without signs of volume overload, BNP normal.  Patient remains with normal sats on room air.  No acute respiratory distress.   Lab Tests: -I ordered, reviewed, and interpreted labs.   The pertinent results include:   Labs Reviewed  COMPREHENSIVE METABOLIC PANEL - Abnormal; Notable for the following components:      Result Value   Glucose, Bld 194 (*)    ALT 59 (*)    All other components within normal limits  CBC WITH DIFFERENTIAL/PLATELET - Abnormal; Notable for the following components:   WBC 3.4 (*)    Neutro Abs 1.5 (*)    All other components within normal limits  BRAIN NATRIURETIC PEPTIDE  TSH  T4, FREE  PROTIME-INR  CBG MONITORING, ED  TROPONIN I (HIGH SENSITIVITY)  TROPONIN I (HIGH SENSITIVITY)      EKG  EKG Interpretation  Date/Time:  Wednesday November 21 2021 08:46:41 EDT Ventricular Rate:  61 PR Interval:  168 QRS Duration: 96 QT Interval:  370 QTC Calculation: 372 R Axis:   112 Text Interpretation: Normal sinus rhythm Right axis deviation ST & T wave abnormality, consider inferior ischemia Abnormal ECG No significant change since last tracing Confirmed by Gareth Morgan [phone]) on 11/21/2021 12:00:27 PM         Imaging Studies ordered: I ordered imaging studies including chest x-ray, CTA head and neck I independently visualized and interpreted imaging. I agree  with the radiologist interpretation   Medicines ordered and prescription drug management: Meds ordered this encounter  Medications   iohexol (OMNIPAQUE) 350 MG/ML injection 150 mL    -I have reviewed the patients home medicines and have made adjustments as needed   Cardiac Monitoring: The patient was maintained on a cardiac monitor.  I personally viewed and interpreted the cardiac monitored which showed an underlying rhythm of: Sinus bradycardia  Reevaluation: After the interventions noted above, I reevaluated the patient and found that they have :improved  Co morbidities that complicate the patient evaluation  Past Medical History:  Diagnosis Date   Chronic systolic CHF (congestive heart failure) ([place]) 06/18/2015   Diabetes mellitus without complication ([place])    Hemorrhagic stroke ([place])    Hyperlipidemia    Hypertension    Nonischemic cardiomyopathy ([place]) Noted as early as 2007   Per chart review (cards consult note 2011), EF of 40% in 2007, down to 20-25% in 2011      Dispostion: Patient at the end of my shift signed out to [facility] for follow-up on MRI and disposition.  Final Clinical Impression(s) / ED Diagnoses Final diagnoses:  None    Rx / DC Orders ED Discharge Orders     None         Evlyn Courier, PA-C 11/21/21 1540    Gareth Morgan, MD 11/21/21 2350

## 2021-11-21 NOTE — Progress Notes (Addendum)
  Evaluation after Contrast Extravasation  Patient seen and examined immediately after contrast extravasation while in CT2. Previous IV in right AV has been removed. Per CT tech approximately 75 cc Omnipaque infiltrated just proximal to previous IV site. Patient reports mild tenderness with palpation near the middle of the right bicep and right index finger numbness which was present before contrast extravasation.  Exam: There is mild swelling at the right biceps area, approximately 6-8 cm above the right AC.  There is no erythema. There is no discoloration. There are no blisters. There are no signs of decreased perfusion of the skin.  The right forearm and hand are warm to touch.  The patient has full ROM in fingers.  The patient has 5/5 grip strength when compared to the left. Radial pulse is normal.  Per contrast extravasation protocol, I have instructed the patient to keep an ice pack on the area for 20-60 minutes at a time for about 48 hours then may use heat on the area for 20-60 minutes at time for the following 48 hours if needed. Orders placed for ice pack to be started and to keep arm elevated as much as possible while in the ED.   The patient understands to call the radiology department if there is: - increase in pain or swelling - changed or altered sensation - ulceration or blistering - increasing redness - warmth or increasing firmness - decreased tissue perfusion as noted by decreased capillary refill or discoloration of skin - decreased pulses peripheral to site   Villa Herb PA-C 11/21/2021 12:55 PM

## 2021-11-21 NOTE — Discharge Instructions (Addendum)
Did not complete your workup today with an MRI to rule out stroke.  I feel comfortable at this point that your symptoms have improved significantly.  Please follow-up with your primary care provider for completion of your workup  Get help right away if: You have unsteadiness that suddenly worsens. You have any of these: Severe headaches. Chest pain. Abdominal pain. Weakness or numbness on one side of your body. Vision problems. Difficulty speaking. An irregular heartbeat. A very fast pulse. You feel confused.   keep an ice pack your right upper arm at the area of the contrast extravasation for 20-60 minutes at a time for 48 hours. After the first 48 hours you may use heat on the area for 20-60 minutes at time for the following 48 hours if needed.    Please call the radiology department at [phone] if you have any of the following: - increase in pain or swelling at the affected area - changed or altered sensation of the affected area - ulceration or blistering of the affected area - increasing redness of the affected area - warmth or increasing firmness affected area - decreased tissue perfusion as noted by decreased capillary refill or discoloration of skin - decreased pulses peripheral to site

## 2021-11-21 NOTE — ED Notes (Signed)
Patient verbalizes understanding of discharge instructions. Opportunity for questioning and answers were provided. Armband removed by staff, pt discharged from ED via wheelchair.  

## 2021-11-21 NOTE — Progress Notes (Signed)
Beginning Cardiac rehab day 2 patient c/o feeling as if he is "leaning to the left", he feels that his head is "foggy" and some difficulty hearing, he also feels as if he's going to "drop things", this worsened yesterday per pt, he states he just lay down all day yesterday, (11/20/21), pt and wife states this has been an intermittent concern since d/c from ED with TIA one year ago, rapid response notified, CBG 140, pt did have breakfast, B/P 112/70 sitting, 112/70 standing, EKG NSR 70, denies pain. Patient taken to the ED for further evaluation. Newell Coral RN 11/21/2021 8:00 AM

## 2021-11-21 NOTE — ED Provider Triage Note (Addendum)
Emergency Medicine Provider Triage Evaluation Note  Jason Stevenson , a 36 y.o. male  was evaluated in triage.  Pt complains of dizziness and feeling off balanced. States he has felt this was intermittently for a few days. He denies room spinning sensation. States that he just feels like he has "brain fog." States he has been unbalanced to both sides but notes the L>R. History of strokes. He endorses SOB (history of cardiomyopathy and CHF). Denies fevers. Denies head trauma or falls, visual changes, weakness to an extremity. .  Review of Systems  Positive:  Negative:   Physical Exam  BP 112/75 (BP Location: Right Arm)   Pulse 66   Temp 98 F (36.7 C) (Oral)   Resp 14   SpO2 100%  Gen:   Awake, no distress   Resp:  Normal effort  MSK:   Moves extremities without difficulty  Other:    Medical Decision Making  Medically screening exam initiated at 8:46 AM.  Appropriate orders placed.  Meziah Blasingame was informed that the remainder of the evaluation will be completed by another provider, this initial triage assessment does not replace that evaluation, and the importance of remaining in the ED until their evaluation is complete.     Cristopher Peru, PA-C 11/21/21 0850    Cristopher Peru, PA-C 11/21/21 [phone]

## 2021-11-22 ENCOUNTER — Other Ambulatory Visit (HOSPITAL_COMMUNITY): Payer: Self-pay

## 2021-11-22 NOTE — Progress Notes (Signed)
Paramedicine Encounter    Patient ID: Jason Stevenson, male    DOB: [birth year], 36 y.o.   MRN: [id number]   Arrived for home paramedicine visit for Jason Stevenson where he reports feeling good today. He states that on Monday 8/7 he had a 5 -6lbs weight gain over the weekend and noted some swelling in his ankles with some mild shortness of breath on exertion so he took an extra Lasix totaling 65m on Monday 8/7 rather than his normal 238mhe takes on MWF. He reports increased urination over the next two days and stated he had not been hydrating properly reporting only drinking 30-40 ounces in a day, on Wednesday (yesterday) he took his morning meds and went to Cardiac Rehab, during exercise became light headed and felt "off balance" so we was sent to the ER for further evaluation. He elected to leave AMA due to long wait times for MRI in the ER but did receive some IV fluids while there and vitals and labs appeared normal for him.   Today he denied dizziness, chest pain, shortness of breath and says his weight is down to 160lbs. No swelling noted. He has been taking his medications out of his bubble packs as prescribed.   I obtained vitals:  WT- 160lbs BP- 98/60 HR- 73 O2- 94% CBG- 198 RR- 16   Lungs clear, no edema noted.   I advised [name] increase his daily fluid intake with a 64-68 oz goal. I advised him when he has  any weight gain or suspected fluid retention to be sure to let me know. He agreed.   We talked about HF management and verified upcoming appointments including setting up PCP appointment for 8/28 at 1050.   Home visit complete. I plan to see [name] two weeks unless he needs follow up sooner.   [place]ena SanerEM[place]/01/2022    Patient Care Team: JoLadell PierMD as PCP - General (Internal Medicine) NiJosue HectorMD as PCP - Cardiology (Cardiology) LaVickie EpleyMD as PCP - Electrophysiology (Cardiology)  Patient Active Problem  List   Diagnosis Date Noted   [facility]mic [facility]   LADA (latent autoimmune diabetes in adults), managed as type 2 (HCKelly12/14/2022   Poorly controlled type 2 diabetes mellitus with circulatory disorder (HCM[facility]12/08/2020   Ischemic cardiomyopathy    Syncope 12/14/2020   Chest pain 10/30/2020   History of cerebrovascular accident (CVA) with residual deficit 10/27/2020   Protein-calorie malnutrition, severe 09/22/2020   Acute CVA (cerebrovascular accident) (HCP[facility]06/12/2020   Near syncope 09/21/2020   Encounter for monitoring Coumadin therapy 09/20/2020   LV (left ventricular) mural thrombus 09/13/2020   Seizure ([facility]06/04/2020   Neurological deficit present 09/12/2020   History of COVID-19 09/12/2020   Intracerebral hemorrhage 09/12/2020   Acute cerebrovascular accident (CVA) (HCD[place]05/31/2022   COVID-19 08/23/2020   Noncompliance with medication treatment due to intermittent use of medication 10/12/2018   Dyslipidemia 0159/97/7414 Chronic systolic CHF (congestive heart failure) (HCD[place]03/08/2015   Needs flu shot 12/23/2013   Non-ischemic cardiomyopathy ([facility]07/25/2014   HTN (hypertension) 11/06/2012   HLD (hyperlipidemia) 11/06/2012    Current Outpatient Medications:    Accu-Chek Softclix Lancets lancets, Use to check blood sugar three times daily., Disp: 100 each, Rfl: 3   acetaminophen (TYLENOL) 325 MG tablet, Take 1-2 tablets (325-650 mg total) by mouth every 4 (four) hours as needed for mild pain., Disp: , Rfl:    atorvastatin (LIPITOR) 80  MG tablet, TAKE 1 TABLET (80 MG TOTAL) BY MOUTH DAILY (EVENING), Disp: 90 tablet, Rfl: 0   Blood Glucose Monitoring Suppl (ACCU-CHEK GUIDE) w/Device KIT, Use to check blood sugar three times daily., Disp: 1 kit, Rfl: 0   Blood Pressure Monitor DEVI, Use as directed to check home blood pressure 2-3 times a week, Disp: 1 Device, Rfl: 0   Continuous Blood Gluc Receiver (FREESTYLE LIBRE 2 READER) DEVI, 1 each by Does not  apply route daily., Disp: 1 each, Rfl: 0   Continuous Blood Gluc Sensor (FREESTYLE LIBRE 2 SENSOR) MISC, USE EVERY 14 (FOURTEEN) DAYS., Disp: 2 each, Rfl: 6   dapagliflozin propanediol (FARXIGA) 5 MG TABS tablet, Take 1 tablet (5 mg total) by mouth daily before breakfast., Disp: 30 tablet, Rfl: 6   digoxin (LANOXIN) 0.125 MG tablet, Take 1 tablet (0.125 mg total) by mouth daily. Please request Cardiologist for future refills., Disp: 30 tablet, Rfl: 0   DULoxetine (CYMBALTA) 30 MG capsule, TAKE 1 CAPSULE (30 MG TOTAL) BY MOUTH DAILY., Disp: 30 capsule, Rfl: 2   Evolocumab (REPATHA SURECLICK) 086 MG/ML SOAJ, Inject 140 mg into the skin every 14 (fourteen) days., Disp: 2 mL, Rfl: 11   ezetimibe (ZETIA) 10 MG tablet, TAKE 1 TABLET (10 MG TOTAL) BY MOUTH DAILY., Disp: 30 tablet, Rfl: 0   furosemide (LASIX) 20 MG tablet, Take 1 tablet (20 mg total) by mouth 3 (three) times a week. Mondays Wednesdays and Friday, Disp: 30 tablet, Rfl: 11   glucose blood (ACCU-CHEK GUIDE) test strip, Use to check blood sugar three times daily., Disp: 100 each, Rfl: 2   insulin aspart (FIASP FLEXTOUCH) 100 UNIT/ML FlexTouch Pen, Inject 5-8 Units into the skin 3 (three) times daily before meals., Disp: 30 mL, Rfl: 3   insulin degludec (TRESIBA FLEXTOUCH) 200 UNIT/ML FlexTouch Pen, Inject 20 Units into the skin daily., Disp: 30 mL, Rfl: 3   Insulin Pen Needle 32G X 4 MM MISC, Use to inject insulin., Disp: 200 each, Rfl: 2   Insulin Syringe-Needle U-100 (RELION INSULIN SYRINGE) 31G X 15/64" 0.3 ML MISC, Use to inject insulin daily., Disp: 100 each, Rfl: 11   ivabradine (CORLANOR) 5 MG TABS tablet, Take 1 tablet (5 mg total) by mouth 2 (two) times daily with a meal., Disp: 60 tablet, Rfl: 6   levETIRAcetam (KEPPRA) 750 MG tablet, Take 2 tablets (1,500 mg total) by mouth 2 (two) times daily., Disp: 360 tablet, Rfl: 4   Menthol, Topical Analgesic, (ICY HOT BACK EX), Apply 1 application topically as needed (neuropathy). Icy hot cream,  Disp: , Rfl:    metFORMIN (GLUCOPHAGE-XR) 500 MG 24 hr tablet, Take 1 tablet (500 mg total) by mouth 2 (two) times daily with a meal., Disp: 180 tablet, Rfl: 3   metoprolol (TOPROL-XL) 200 MG 24 hr tablet, TAKE 0.5 TABLETS (100 MG TOTAL) BY MOUTH DAILY. TAKE WITH OR IMMEDIATELY FOLLOWING A MEAL (EVENING), Disp: 30 tablet, Rfl: 0   Oxcarbazepine (TRILEPTAL) 300 MG tablet, Take 2 tablets (600 mg total) by mouth 2 (two) times daily., Disp: 360 tablet, Rfl: 4   phenytoin (DILANTIN) 100 MG ER capsule, Take 1 capsule (100 mg total) by mouth 3 (three) times daily., Disp: 270 capsule, Rfl: 4   potassium chloride SA (KLOR-CON M) 20 MEQ tablet, Take 1 tablet (20 mEq total) by mouth 3 (three) times a week. Take every Mon, Wed and Fri with Lasix, Disp: 30 tablet, Rfl: 4   pregabalin (LYRICA) 300 MG capsule, TAKE 1 CAPSULE (300  MG TOTAL) BY MOUTH AT BEDTIME., Disp: 30 capsule, Rfl: 2   sacubitril-valsartan (ENTRESTO) 49-51 MG, Take 1 tablet by mouth 2 (two) times daily., Disp: 60 tablet, Rfl: 3   spironolactone (ALDACTONE) 25 MG tablet, Take 1 tablet (25 mg total) by mouth at bedtime., Disp: 90 tablet, Rfl: 3   warfarin (COUMADIN) 5 MG tablet, Take 1-1.5 tablets (5-7.5 mg total) by mouth daily as directed by Coumadin clinic, Disp: 40 tablet, Rfl: 3 No Known Allergies   Social History   Socioeconomic History   Marital status: Married    Spouse name: [name]   Number of children: 0   Years of education: 13   Highest education level: Some college, no degree  Occupational History   Occupation: unemployed  Tobacco Use   Smoking status: Never   Smokeless tobacco: Never  Vaping Use   Vaping Use: Not on file  Substance and Sexual Activity   Alcohol use: Not Currently    Comment: "occasional" when "hanging out with the wrong people" No recent use.   Drug use: Not Currently    Types: Marijuana    Comment: occasional, last 2013   Sexual activity: Not Currently  Other Topics Concern   Not on file  Social  History Narrative   Pt lives at home with his wife    Right handed    Caffeine- hardly any   Social Determinants of Health   Financial Resource Strain: High Risk (10/18/2021)   Overall Financial Resource Strain (CARDIA)    Difficulty of Paying Living Expenses: Hard  Food Insecurity: No Food Insecurity (09/14/2020)   Hunger Vital Sign    Worried About Running Out of Food in the Last Year: Never true    Ran Out of Food in the Last Year: Never true  Recent Concern: Food Insecurity - Food Insecurity Present (09/13/2020)   Hunger Vital Sign    Worried About Running Out of Food in the Last Year: Never true    Ran Out of Food in the Last Year: Sometimes true  Transportation Needs: Unmet Transportation Needs (12/15/2020)   PRAPARE - Hydrologist (Medical): No    Lack of Transportation (Non-Medical): Yes  Physical Activity: Not on file  Stress: Not on file  Social Connections: Not on file  Intimate Partner Violence: Not on file    Physical Exam      Future Appointments  Date Time Provider Zap  11/23/2021  6:45 AM [facility] PHASE II EXC [facility] None  11/26/2021  6:45 AM [facility] PHASE II EXC [facility] None  11/28/2021  6:45 AM [facility] PHASE II EXC [facility] None  11/29/2021  7:00 AM [facility] DEVICE REMOTES [facility]  11/30/2021  6:45 AM [facility] PHASE II EXC [facility] None  12/03/2021  6:45 AM [facility] PHASE II EXC [facility] None  12/05/2021  6:45 AM [facility] PHASE II EXC [facility] None  12/05/2021  3:00 PM Vickie Epley, MD [facility]  12/07/2021  6:45 AM [facility] PHASE II EXC [facility] None  12/10/2021  6:45 AM [facility] PHASE II EXC [facility] None  12/10/2021  9:00 AM [facility]  12/12/2021  6:45 AM [facility] PHASE II EXC [facility] None  12/14/2021  6:45 AM [facility] PHASE II EXC [facility] None  12/19/2021  6:45 AM [facility] PHASE II EXC [facility] None  12/21/2021  6:45 AM [facility] PHASE II EXC  [facility] None  12/24/2021  6:45 AM [facility] PHASE II EXC [facility] None  12/26/2021  6:45 AM [facility]  PHASE II EXC [facility] None  12/28/2021  6:45 AM [facility] PHASE II EXC [facility] None  02/28/2022  7:00 AM [facility] DEVICE REMOTES [facility]  03/18/2022  8:30 AM [facility] LAB [facility] None  03/21/2022  8:40 AM Philemon Kingdom, MD [facility] None  03/22/2022  8:40 AM Philemon Kingdom, MD [facility] None  05/30/2022  7:00 AM [facility] DEVICE REMOTES [facility]  07/01/2022  1:30 PM Penumalli, Earlean Polka, MD [facility] None  08/29/2022  7:00 AM [facility] DEVICE REMOTES [facility]  11/28/2022  7:00 AM [facility] DEVICE REMOTES [facility]     ACTION: Home visit completed

## 2021-11-23 ENCOUNTER — Telehealth (HOSPITAL_COMMUNITY): Payer: Self-pay | Admitting: *Deleted

## 2021-11-23 ENCOUNTER — Encounter (HOSPITAL_COMMUNITY): Payer: Self-pay | Admitting: *Deleted

## 2021-11-23 ENCOUNTER — Encounter (HOSPITAL_COMMUNITY): Payer: Medicaid Other

## 2021-11-23 NOTE — Progress Notes (Signed)
Incomplete Session Note  Patient Details  Name: Jason Stevenson MRN: [id number] Date of Birth: 08-Feb-1986 Referring Provider:   Flowsheet Row CARDIAC REHAB PHASE II ORIENTATION from 11/13/2021 in [facility] CARDIAC [facility]  Referring Provider Elly Modena MD       [name] did not complete his rehab session.  Awaiting clearance from Dr.Mclean to return to rehab.  The following in basket message sent to Dr. Shirlee Latch  "Dr. Shirlee Latch,   Jaedon seen in the ER on 8/9 for further evaluation of reported symptoms of foggy thinking, difficulty hearing and leaning to the left more than usual. Pt seen but did not complete care.  Pt had head CT and was awaiting MRI. He was told he was on the wait list but wasn't provided update and felt the care he did receive was poor, therefore he did leave.  The ER provider has not finished their note, I am unable to determine if the plan was referral for neurology.  From talking with Connelly it seems as he was lost to follow up with the neurologist. Seen by the paramedic team staff, pt had returned to his baseline.  Ok for [place] to return?  Does he need to complete MRI prior??  Referral to neurology??   Thanks for your valued input  Pharmacist, hospital, BSN  Cardiac and Pulmonary Rehab Nurse Navigator"  Spoke to pt and wife, Jason Stevenson.  Aware to wait for phone call from Cardiac rehab for returning to exercise after the ED visit on 8/9.  Verbalized understanding. Bertrice Leder

## 2021-11-23 NOTE — Telephone Encounter (Signed)
-----   Message from Laurey Morale, MD sent at 11/23/2021  7:41 AM EDT ----- Regarding: RE: Return to Cardiac rehab He can return to rehab ----- Message ----- From: Chelsea Aus, RN Sent: 11/23/2021   7:14 AM EDT To: Laurey Morale, MD Subject: Return to Cardiac rehab                         Dr. [name] seen in the ER on 8/9 for further evaluation of reported symptoms of foggy thinking, difficulty hearing and leaning to the left more than usual. Pt seen but did not complete care.  Pt had head CT and was awaiting MRI. He was told he was on the wait list but wasn't provided update and felt the care he did receive was poor, therefore he did leave.  The ER provider has not finished their note, I am unable to determine if the plan was referral for neurology.  From talking with [name] it seems as he was lost to follow up with the neurologist. Seen by the paramedic team staff, pt had returned to his baseline.  Ok for [place] to return?  Does he need to complete MRI prior??  Referral to neurology??  Thanks for your valued input Pharmacist, hospital, BSN Cardiac and Pulmonary Rehab Nurse Navigator

## 2021-11-26 ENCOUNTER — Encounter (HOSPITAL_COMMUNITY)
Admission: RE | Admit: 2021-11-26 | Discharge: 2021-11-26 | Disposition: A | Discharge status: Home / Self Care | Payer: Medicaid Other | Source: Ambulatory Visit | Attending: Cardiology | Admitting: Cardiology

## 2021-11-26 DIAGNOSIS — I5022 Chronic systolic (congestive) heart failure: Secondary | ICD-10-CM

## 2021-11-26 LAB — GLUCOSE, CAPILLARY: Glucose-Capillary: 152 mg/dL — ABNORMAL HIGH (ref 70–99)

## 2021-11-27 ENCOUNTER — Telehealth: Payer: Self-pay | Admitting: Diagnostic Neuroimaging

## 2021-11-27 NOTE — Telephone Encounter (Signed)
In response to message received that pt wanted a call to schedule an appointment for Off balancing experiences and had brain fog went to ER on 11/21/2021.  Phone rep did call to inform pt the records shows that he has a bad debt and would need to speak with billing dept. Before an appointment can be made. [facility] phone # left on vm for pt to call back.

## 2021-11-28 ENCOUNTER — Ambulatory Visit (INDEPENDENT_AMBULATORY_CARE_PROVIDER_SITE_OTHER): Payer: Medicaid Other | Admitting: Family Medicine

## 2021-11-28 ENCOUNTER — Encounter: Payer: Self-pay | Admitting: Family Medicine

## 2021-11-28 ENCOUNTER — Encounter (HOSPITAL_COMMUNITY)
Admission: RE | Admit: 2021-11-28 | Discharge: 2021-11-28 | Disposition: A | Discharge status: Home / Self Care | Payer: Medicaid Other | Source: Ambulatory Visit | Attending: Cardiology | Admitting: Cardiology

## 2021-11-28 VITALS — BP 114/72 | HR 64 | Ht 72.0 in | Wt 163.8 lb

## 2021-11-28 DIAGNOSIS — R2689 Other abnormalities of gait and mobility: Secondary | ICD-10-CM

## 2021-11-28 DIAGNOSIS — R413 Other amnesia: Secondary | ICD-10-CM

## 2021-11-28 DIAGNOSIS — I63413 Cerebral infarction due to embolism of bilateral middle cerebral arteries: Secondary | ICD-10-CM

## 2021-11-28 DIAGNOSIS — G40909 Epilepsy, unspecified, not intractable, without status epilepticus: Secondary | ICD-10-CM

## 2021-11-28 DIAGNOSIS — R4189 Other symptoms and signs involving cognitive functions and awareness: Secondary | ICD-10-CM

## 2021-11-28 DIAGNOSIS — I5022 Chronic systolic (congestive) heart failure: Secondary | ICD-10-CM

## 2021-11-28 NOTE — Progress Notes (Signed)
Chief Complaint  Patient presents with   Follow-up    Rm 19, alone. Here to f/u for off balance and brain fog. Pt has had the brain fog for a week. Has noticed some short term memory loss. MOCA:25    HISTORY OF PRESENT ILLNESS:  11/28/21 ALL:  Jason Stevenson is a 36 y.o. male here today for follow up for complaints of feeling off balance and brain fog. 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 He was seen by ER 11/21/2021 with similar complaints at cardiac rehab that day. CTA and labs were unremarkable. He reports that balance has been "off" since stroke a year ago. He felt that it may have been a little worse last week. He participates in cardiac rehab on M, W and Th. He was able to participate without difficulty, today. He is riding a bike for about 20-30 minutes. He does stretches with PT. CBGs have been "good". A1C was 8.7 with endo on 8/3, up from 7.8 in 06/2021. BP is usually pretty good.   No recent seizure activity. He continues levetiracetam 1534m and oxcarbazepine 6011mBID and phenytoin 10075mID. No missed doses of AED. He feels that memory is poor. He noticed this about 2 months ago. He reports forgetting appts. He asks his wife to repeat things often. He is able to drive without difficulty. He is able to perform ADLs. His pharmacy pill packs meds but he is able to administer himself. He lives with his wife. He graduated high school. 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 No other events.    PRIOR HPI (12/20/20, VRP): [age]ar old male with diabetes, nonischemic cardiomyopathy, CHF, hypertension, hyperlipidemia, presented to hospital on 09/11/2020 for left-sided twitching.  Blood glucose was greater than  600.  MRI showed multiple right frontal and left parietal subacute hemorrhagic infarcts, and right frontotemporal gyriform diffusion abnormality possibly related to seizure related changes.  Patient was treated for seizure and stroke prevention.  He was discharged on Coumadin anticoagulation and Keppra, Dilantin, Trileptal. He was again admitted to the hospital in December 16, 2020 for syncope.     REVIEW OF SYSTEMS: Out of a complete 14 system review of symptoms, the patient complains only of the following symptoms, imbalance, short term memory loss, and all other reviewed systems are negative.   ALLERGIES: No Known Allergies   HOME MEDICATIONS: Outpatient Medications Prior to Visit  Medication Sig Dispense Refill   Accu-Chek Softclix Lancets lancets Use to check blood sugar three times daily. 100 each 3   acetaminophen (TYLENOL) 325 MG tablet Take 1-2 tablets (325-650 mg total) by mouth every 4 (four) hours as needed for mild pain.     atorvastatin (LIPITOR) 80 MG tablet TAKE 1 TABLET (80 MG TOTAL) BY MOUTH DAILY (EVENING) 90 tablet 0   Blood Glucose Monitoring Suppl (ACCU-CHEK GUIDE) w/Device KIT Use to check blood sugar three times daily. 1 kit 0   Blood Pressure Monitor DEVI Use as directed to check home blood pressure 2-3 times a week 1 Device 0   Continuous Blood Gluc Receiver (FREESTYLE LIBRE 2 READER) DEVI 1 each by Does not apply route daily. 1 each 0   Continuous Blood Gluc Sensor (FREESTYLE LIBRE 2 SENSOR) MISC USE EVERY 14 (FOURTEEN) DAYS.  2 each 6   dapagliflozin propanediol (FARXIGA) 5 MG TABS tablet Take 1 tablet (5 mg total) by mouth daily before breakfast. 30 tablet 6   digoxin (LANOXIN) 0.125 MG tablet Take 1 tablet (0.125 mg total) by mouth daily. Please request Cardiologist for future refills. 30 tablet 0   DULoxetine (CYMBALTA) 30 MG capsule TAKE 1 CAPSULE (30 MG TOTAL) BY MOUTH DAILY. 30 capsule 2   Evolocumab (REPATHA SURECLICK) 025 MG/ML SOAJ Inject 140 mg into the  skin every 14 (fourteen) days. 2 mL 11   ezetimibe (ZETIA) 10 MG tablet TAKE 1 TABLET (10 MG TOTAL) BY MOUTH DAILY. 30 tablet 0   furosemide (LASIX) 20 MG tablet Take 1 tablet (20 mg total) by mouth 3 (three) times a week. Mondays Wednesdays and Friday 30 tablet 11   glucose blood (ACCU-CHEK GUIDE) test strip Use to check blood sugar three times daily. 100 each 2   insulin aspart (FIASP FLEXTOUCH) 100 UNIT/ML FlexTouch Pen Inject 5-8 Units into the skin 3 (three) times daily before meals. 30 mL 3   insulin degludec (TRESIBA FLEXTOUCH) 200 UNIT/ML FlexTouch Pen Inject 20 Units into the skin daily. 30 mL 3   Insulin Pen Needle 32G X 4 MM MISC Use to inject insulin. 200 each 2   Insulin Syringe-Needle U-100 (RELION INSULIN SYRINGE) 31G X 15/64" 0.3 ML MISC Use to inject insulin daily. 100 each 11   ivabradine (CORLANOR) 5 MG TABS tablet Take 1 tablet (5 mg total) by mouth 2 (two) times daily with a meal. 60 tablet 6   levETIRAcetam (KEPPRA) 750 MG tablet Take 2 tablets (1,500 mg total) by mouth 2 (two) times daily. 360 tablet 4   Menthol, Topical Analgesic, (ICY HOT BACK EX) Apply 1 application topically as needed (neuropathy). Icy hot cream     metFORMIN (GLUCOPHAGE-XR) 500 MG 24 hr tablet Take 1 tablet (500 mg total) by mouth 2 (two) times daily with a meal. 180 tablet 3   metoprolol (TOPROL-XL) 200 MG 24 hr tablet TAKE 0.5 TABLETS (100 MG TOTAL) BY MOUTH DAILY. TAKE WITH OR IMMEDIATELY FOLLOWING A MEAL (EVENING) 30 tablet 0   Oxcarbazepine (TRILEPTAL) 300 MG tablet Take 2 tablets (600 mg total) by mouth 2 (two) times daily. 360 tablet 4   phenytoin (DILANTIN) 100 MG ER capsule Take 1 capsule (100 mg total) by mouth 3 (three) times daily. 270 capsule 4   potassium chloride SA (KLOR-CON M) 20 MEQ tablet Take 1 tablet (20 mEq total) by mouth 3 (three) times a week. Take every Mon, Wed and Fri with Lasix 30 tablet 4   pregabalin (LYRICA) 300 MG capsule TAKE 1 CAPSULE (300 MG TOTAL) BY MOUTH AT BEDTIME.  30 capsule 2   sacubitril-valsartan (ENTRESTO) 49-51 MG Take 1 tablet by mouth 2 (two) times daily. 60 tablet 3   spironolactone (ALDACTONE) 25 MG tablet Take 1 tablet (25 mg total) by mouth at bedtime. 90 tablet 3   warfarin (COUMADIN) 5 MG tablet Take 1-1.5 tablets (5-7.5 mg total) by mouth daily as directed by Coumadin clinic 40 tablet 3   No facility-administered medications prior to visit.    PAST MEDICAL HISTORY: Past Medical History:  Diagnosis Date   Chronic systolic CHF (congestive heart failure) ([place]) 06/18/2015   Diabetes mellitus without complication ([place])    Hemorrhagic stroke ([place])    Hyperlipidemia    Hypertension    Nonischemic cardiomyopathy ([place]) Noted as early as 2007   Per chart review (cards consult note 2011), EF  of 40% in 2007, down to 20-25% in 2011     PAST SURGICAL HISTORY: Past Surgical History:  Procedure Laterality Date   ICD IMPLANT N/A 08/27/2021   Procedure: ICD IMPLANT;  Surgeon: Vickie Epley, MD;  Location: [facility];  Service: Cardiovascular;  Laterality: N/A;   None     RIGHT HEART CATH  06/21/2021   [facility]   RIGHT/LEFT HEART CATH AND CORONARY ANGIOGRAPHY N/A 11/02/2020   Procedure: RIGHT/LEFT HEART CATH AND CORONARY ANGIOGRAPHY;  Surgeon: Larey Dresser, MD;  Location: [facility];  Service: Cardiovascular;  Laterality: N/A;     FAMILY HISTORY: Family History  Problem Relation Age of Onset   Stroke Mother    Diabetes Mother    Hypertension Mother    Stroke Maternal Aunt    Heart attack Neg Hx      SOCIAL HISTORY: Social History   Socioeconomic History   Marital status: Married    Spouse name: [name]   Number of children: 0   Years of education: 13   Highest education level: Some college, no degree  Occupational History   Occupation: unemployed  Tobacco Use   Smoking status: Never   Smokeless tobacco: Never  Vaping Use   Vaping Use: Not on file  Substance and Sexual Activity   Alcohol use:  Not Currently    Comment: "occasional" when "hanging out with the wrong people" No recent use.   Drug use: Not Currently    Types: Marijuana    Comment: occasional, last 2013   Sexual activity: Not Currently  Other Topics Concern   Not on file  Social History Narrative   Pt lives at home with his wife    Right handed    Caffeine- hardly any   Social Determinants of Health   Financial Resource Strain: High Risk (10/18/2021)   Overall Financial Resource Strain (CARDIA)    Difficulty of Paying Living Expenses: Hard  Food Insecurity: No Food Insecurity (09/14/2020)   Hunger Vital Sign    Worried About Running Out of Food in the Last Year: Never true    Ran Out of Food in the Last Year: Never true  Recent Concern: Food Insecurity - Food Insecurity Present (09/13/2020)   Hunger Vital Sign    Worried About Running Out of Food in the Last Year: Never true    Ran Out of Food in the Last Year: Sometimes true  Transportation Needs: Unmet Transportation Needs (12/15/2020)   PRAPARE - Hydrologist (Medical): No    Lack of Transportation (Non-Medical): Yes  Physical Activity: Not on file  Stress: Not on file  Social Connections: Not on file  Intimate Partner Violence: Not on file     PHYSICAL EXAM  Vitals:   11/28/21 0942  BP: 114/72  Pulse: 64  Weight: 163 lb 12.8 oz (74.3 kg)  Height: 6' (1.829 m)   Body mass index is 22.22 kg/m.  Generalized: Well developed, in no acute distress  Cardiology: normal rate and rhythm, no murmur auscultated  Respiratory: clear to auscultation bilaterally    Neurological examination  Mentation: Alert oriented to time, place, history taking. Follows all commands speech and language fluent Cranial nerve II-XII: Pupils were equal round reactive to light. Extraocular movements were full, visual field were full on confrontational test. Facial sensation and strength were normal. Uvula tongue midline. Head turning and shoulder  shrug  were normal and symmetric. Motor: The motor testing reveals 5  over 5 strength of all 4 extremities. Good symmetric motor tone is noted throughout.  Sensory: Sensory testing is intact to soft touch on all 4 extremities. No evidence of extinction is noted.  Coordination: Cerebellar testing reveals good finger-nose-finger and heel-to-shin bilaterally.  Gait and station: Gait is normal.    DIAGNOSTIC DATA (LABS, IMAGING, TESTING) - I reviewed patient records, labs, notes, testing and imaging myself where available.  Lab Results  Component Value Date   WBC 3.4 (L) 11/21/2021   HGB 14.5 11/21/2021   HCT 44.2 11/21/2021   MCV 89.3 11/21/2021   PLT 217 11/21/2021      Component Value Date/Time   NA 141 11/21/2021 0857   NA 142 08/03/2021 1313   K 4.5 11/21/2021 0857   CL 103 11/21/2021 0857   CO2 31 11/21/2021 0857   GLUCOSE 194 (H) 11/21/2021 0857   BUN 9 11/21/2021 0857   BUN 12 08/03/2021 1313   CREATININE 0.84 11/21/2021 0857   CREATININE 0.80 04/17/2016 1204   CALCIUM 9.2 11/21/2021 0857   PROT 7.0 11/21/2021 0857   PROT 7.2 11/26/2019 0858   ALBUMIN 3.9 11/21/2021 0857   ALBUMIN 4.6 11/26/2019 0858   AST 37 11/21/2021 0857   ALT 59 (H) 11/21/2021 0857   ALKPHOS 98 11/21/2021 0857   BILITOT 0.3 11/21/2021 0857   BILITOT 0.4 11/26/2019 0858   GFRNONAA >60 11/21/2021 0857   GFRNONAA >89 04/17/2016 1204   GFRAA 138 08/04/2019 0952   GFRAA >89 04/17/2016 1204   Lab Results  Component Value Date   CHOL 133 09/21/2021   HDL 47 09/21/2021   LDLCALC 56 09/21/2021   TRIG 149 09/21/2021   CHOLHDL 2.8 09/21/2021   Lab Results  Component Value Date   HGBA1C 8.7 (A) 11/15/2021   Lab Results  Component Value Date   VITAMINB12 1,427 (H) 11/26/2019   Lab Results  Component Value Date   TSH 0.904 11/21/2021        No data to display              11/28/2021   10:00 AM  Montreal Cognitive Assessment   Visuospatial/ Executive (0/5) 4  Naming (0/3) 3   Attention: Read list of digits (0/2) 1  Attention: Read list of letters (0/1) 1  Attention: Serial 7 subtraction starting at 100 (0/3) 3  Language: Repeat phrase (0/2) 2  Language : Fluency (0/1) 1  Abstraction (0/2) 2  Delayed Recall (0/5) 2  Orientation (0/6) 6  Total 25     ASSESSMENT AND PLAN  36 y.o. year old male  has a past medical history of Chronic systolic CHF (congestive heart failure) ([place]) (06/18/2015), Diabetes mellitus without complication ([place]), Hemorrhagic stroke ([place]), Hyperlipidemia, Hypertension, and Nonischemic cardiomyopathy ([place]) (Noted as early as 2007). 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 ER evaluation was unremarkable. I will update AED levels, today. Neuro exam intact. MOCA 25/30. Lost most points in recall and serial subtractions. I have discussed most likely contributors of co morbidities, medication side effects and stress. Neurodegenerative memory loss highly unlikely at his age. We discussed formal neurocognitive evaluation and I encouraged him to discuss with primary care. He was encouraged to continue cardiac rehab as directed. Could consider speech  therapy for cognitive therapy if he wishes. Healthy lifestyle habits encouraged. He will follow up with PCP as directed. He will return to see Dr Leta Baptist as scheduled in March 2024. He verbalizes understanding and agreement with this plan.    Orders Placed This Encounter  Procedures   Levetiracetam level   10-Hydroxycarbazepine   Phenytoin Level, Total     No orders of the defined types were placed in this encounter.    Debbora Presto, MSN, FNP-C 11/28/2021, 11:46 AM  [facility] [address] [place], [place] [address] [phone]

## 2021-11-28 NOTE — Patient Instructions (Signed)
Below is our plan:  We will continue levetiracetam, oxcarbazepine and phenytoin. Discuss memory concerns with PCP if you see they are worsening. Your neuro exam looks good. I am concerned that medication side effects could contribute, however, we have to weigh risks versus benefits. Neuropsychology could do a formal neurocognitive evaluation if you wish.   Please make sure you are staying well hydrated. I recommend 50-60 ounces daily. Well balanced diet and regular exercise encouraged. Consistent sleep schedule with 6-8 hours recommended.   Please continue follow up with care team as directed.   Follow up with Dr Marjory Lies in March as scheduled  You may receive a survey regarding today's visit. I encourage you to leave honest feed back as I do use this information to improve patient care. Thank you for seeing me today!

## 2021-11-29 ENCOUNTER — Ambulatory Visit (INDEPENDENT_AMBULATORY_CARE_PROVIDER_SITE_OTHER): Payer: Medicaid Other

## 2021-11-29 DIAGNOSIS — Z95810 Presence of automatic (implantable) cardiac defibrillator: Secondary | ICD-10-CM | POA: Diagnosis not present

## 2021-11-30 ENCOUNTER — Other Ambulatory Visit: Payer: Self-pay | Admitting: Cardiology

## 2021-11-30 ENCOUNTER — Encounter (HOSPITAL_COMMUNITY)
Admission: RE | Admit: 2021-11-30 | Discharge: 2021-11-30 | Disposition: A | Discharge status: Home / Self Care | Payer: Medicaid Other | Source: Ambulatory Visit | Attending: Cardiology | Admitting: Cardiology

## 2021-11-30 DIAGNOSIS — I5022 Chronic systolic (congestive) heart failure: Secondary | ICD-10-CM | POA: Diagnosis not present

## 2021-12-03 ENCOUNTER — Encounter (HOSPITAL_COMMUNITY)
Admission: RE | Admit: 2021-12-03 | Discharge: 2021-12-03 | Disposition: A | Discharge status: Home / Self Care | Payer: Medicaid Other | Source: Ambulatory Visit | Attending: Cardiology | Admitting: Cardiology

## 2021-12-03 DIAGNOSIS — I5022 Chronic systolic (congestive) heart failure: Secondary | ICD-10-CM | POA: Diagnosis not present

## 2021-12-03 LAB — CUP PACEART REMOTE DEVICE CHECK
Battery Remaining Longevity: 133 mo
Battery Voltage: 3.05 V
Brady Statistic RV Percent Paced: 0.04 %
Date Time Interrogation Session: 20230818135906
HighPow Impedance: 67 Ohm
Implantable Lead Implant Date: 20230515
Implantable Lead Location: 753860
Implantable Pulse Generator Implant Date: 20230515
Lead Channel Impedance Value: 399 Ohm
Lead Channel Impedance Value: 513 Ohm
Lead Channel Pacing Threshold Amplitude: 0.75 V
Lead Channel Pacing Threshold Pulse Width: 0.4 ms
Lead Channel Sensing Intrinsic Amplitude: 3.375 mV
Lead Channel Sensing Intrinsic Amplitude: 3.375 mV
Lead Channel Setting Pacing Amplitude: 2.75 V
Lead Channel Setting Pacing Pulse Width: 0.4 ms
Lead Channel Setting Sensing Sensitivity: 0.3 mV

## 2021-12-03 LAB — PHENYTOIN LEVEL, TOTAL: Phenytoin (Dilantin), Serum: 9.3 ug/mL — ABNORMAL LOW (ref 10.0–20.0)

## 2021-12-03 LAB — 10-HYDROXYCARBAZEPINE: Oxcarbazepine SerPl-Mcnc: 18 ug/mL (ref 10–35)

## 2021-12-03 LAB — LEVETIRACETAM LEVEL: Levetiracetam Lvl: 47.6 ug/mL — ABNORMAL HIGH (ref 10.0–40.0)

## 2021-12-05 ENCOUNTER — Ambulatory Visit: Payer: Medicaid Other | Admitting: Cardiology

## 2021-12-05 ENCOUNTER — Encounter (HOSPITAL_COMMUNITY)
Admission: RE | Admit: 2021-12-05 | Discharge: 2021-12-05 | Disposition: A | Discharge status: Home / Self Care | Payer: Medicaid Other | Source: Ambulatory Visit | Attending: Cardiology | Admitting: Cardiology

## 2021-12-05 ENCOUNTER — Encounter: Payer: Self-pay | Admitting: Cardiology

## 2021-12-05 VITALS — BP 96/62 | HR 72 | Ht 72.0 in | Wt 162.2 lb

## 2021-12-05 DIAGNOSIS — I5022 Chronic systolic (congestive) heart failure: Secondary | ICD-10-CM

## 2021-12-05 DIAGNOSIS — I428 Other cardiomyopathies: Secondary | ICD-10-CM | POA: Diagnosis not present

## 2021-12-05 DIAGNOSIS — Z95810 Presence of automatic (implantable) cardiac defibrillator: Secondary | ICD-10-CM

## 2021-12-05 LAB — CUP PACEART INCLINIC DEVICE CHECK
Battery Remaining Longevity: 133 mo
Battery Voltage: 3.04 V
Brady Statistic RV Percent Paced: 0.03 %
Date Time Interrogation Session: 20230823165154
HighPow Impedance: 63 Ohm
Implantable Lead Implant Date: 20230515
Implantable Lead Location: 753860
Implantable Pulse Generator Implant Date: 20230515
Lead Channel Impedance Value: 399 Ohm
Lead Channel Impedance Value: 494 Ohm
Lead Channel Pacing Threshold Amplitude: 0.875 V
Lead Channel Pacing Threshold Pulse Width: 0.4 ms
Lead Channel Sensing Intrinsic Amplitude: 3.125 mV
Lead Channel Sensing Intrinsic Amplitude: 4.125 mV
Lead Channel Setting Pacing Amplitude: 2.25 V
Lead Channel Setting Pacing Pulse Width: 0.4 ms
Lead Channel Setting Sensing Sensitivity: 0.3 mV

## 2021-12-05 NOTE — Patient Instructions (Signed)
Medication Instructions:  none *If you need a refill on your cardiac medications before your next appointment, please call your pharmacy*   Lab Work: none If you have labs (blood work) drawn today and your tests are completely normal, you will receive your results only by: MyChart Message (if you have MyChart) OR A paper copy in the mail If you have any lab test that is abnormal or we need to change your treatment, we will call you to review the results.   Testing/Procedures: none   Follow-Up: At [facility], you and your health needs are our priority.  As part of our continuing mission to provide you with exceptional heart care, we have created designated Provider Care Teams.  These Care Teams include your primary Cardiologist (physician) and Advanced Practice Providers (APPs -  Physician Assistants and Nurse Practitioners) who all work together to provide you with the care you need, when you need it.  We recommend signing up for the patient portal called "MyChart".  Sign up information is provided on this After Visit Summary.  MyChart is used to connect with patients for Virtual Visits (Telemedicine).  Patients are able to view lab/test results, encounter notes, upcoming appointments, etc.  Non-urgent messages can be sent to your provider as well.   To learn more about what you can do with MyChart, go to [email or web address].    Your next appointment:   1 year(s)  The format for your next appointment:   In Person  Provider:   Cameron Lambert, MD{   Other Instructions none  Important Information About Sugar       

## 2021-12-05 NOTE — Progress Notes (Signed)
Cardiac Individual Treatment Plan  Patient Details  Name: Jason Stevenson MRN: [id number] Date of Birth: 12-01-1985 Referring Provider:   Flowsheet Row CARDIAC REHAB PHASE II ORIENTATION from 11/13/2021 in [place]  Referring Provider Rowland Lathe MD       Initial Encounter Date:  [facility] PHASE II ORIENTATION from 11/13/2021 in [place]  Date 11/13/21       Visit Diagnosis: Heart failure, chronic systolic ([place])  Patient's Home Medications on Admission:  Current Outpatient Medications:    Accu-Chek Softclix Lancets lancets, Use to check blood sugar three times daily., Disp: 100 each, Rfl: 3   acetaminophen (TYLENOL) 325 MG tablet, Take 1-2 tablets (325-650 mg total) by mouth every 4 (four) hours as needed for mild pain., Disp: , Rfl:    atorvastatin (LIPITOR) 80 MG tablet, TAKE 1 TABLET (80 MG TOTAL) BY MOUTH DAILY (EVENING), Disp: 90 tablet, Rfl: 0   Blood Glucose Monitoring Suppl (ACCU-CHEK GUIDE) w/Device KIT, Use to check blood sugar three times daily., Disp: 1 kit, Rfl: 0   Blood Pressure Monitor DEVI, Use as directed to check home blood pressure 2-3 times a week, Disp: 1 Device, Rfl: 0   Continuous Blood Gluc Receiver (FREESTYLE LIBRE 2 READER) DEVI, 1 each by Does not apply route daily., Disp: 1 each, Rfl: 0   Continuous Blood Gluc Sensor (FREESTYLE LIBRE 2 SENSOR) MISC, USE EVERY 14 (FOURTEEN) DAYS., Disp: 2 each, Rfl: 6   dapagliflozin propanediol (FARXIGA) 5 MG TABS tablet, Take 1 tablet (5 mg total) by mouth daily before breakfast., Disp: 30 tablet, Rfl: 6   digoxin (LANOXIN) 0.125 MG tablet, Take 1 tablet (0.125 mg total) by mouth daily. Please request Cardiologist for future refills., Disp: 30 tablet, Rfl: 0   DULoxetine (CYMBALTA) 30 MG capsule, TAKE 1 CAPSULE (30 MG TOTAL) BY MOUTH DAILY., Disp: 30 capsule, Rfl: 2   Evolocumab (REPATHA SURECLICK) 355 MG/ML SOAJ, Inject 140 mg into the  skin every 14 (fourteen) days., Disp: 2 mL, Rfl: 11   ezetimibe (ZETIA) 10 MG tablet, TAKE 1 TABLET (10 MG TOTAL) BY MOUTH DAILY., Disp: 30 tablet, Rfl: 0   furosemide (LASIX) 20 MG tablet, Take 1 tablet (20 mg total) by mouth 3 (three) times a week. Mondays Wednesdays and Friday, Disp: 30 tablet, Rfl: 11   glucose blood (ACCU-CHEK GUIDE) test strip, Use to check blood sugar three times daily., Disp: 100 each, Rfl: 2   insulin aspart (FIASP FLEXTOUCH) 100 UNIT/ML FlexTouch Pen, Inject 5-8 Units into the skin 3 (three) times daily before meals., Disp: 30 mL, Rfl: 3   insulin degludec (TRESIBA FLEXTOUCH) 200 UNIT/ML FlexTouch Pen, Inject 20 Units into the skin daily., Disp: 30 mL, Rfl: 3   Insulin Pen Needle 32G X 4 MM MISC, Use to inject insulin., Disp: 200 each, Rfl: 2   Insulin Syringe-Needle U-100 (RELION INSULIN SYRINGE) 31G X 15/64" 0.3 ML MISC, Use to inject insulin daily., Disp: 100 each, Rfl: 11   ivabradine (CORLANOR) 5 MG TABS tablet, Take 1 tablet (5 mg total) by mouth 2 (two) times daily with a meal., Disp: 60 tablet, Rfl: 6   levETIRAcetam (KEPPRA) 750 MG tablet, Take 2 tablets (1,500 mg total) by mouth 2 (two) times daily., Disp: 360 tablet, Rfl: 4   Menthol, Topical Analgesic, (ICY HOT BACK EX), Apply 1 application topically as needed (neuropathy). Icy hot cream, Disp: , Rfl:    metFORMIN (GLUCOPHAGE-XR) 500 MG 24 hr tablet,  Take 1 tablet (500 mg total) by mouth 2 (two) times daily with a meal., Disp: 180 tablet, Rfl: 3   metoprolol (TOPROL-XL) 200 MG 24 hr tablet, TAKE 0.5 TABLETS (100 MG TOTAL) BY MOUTH DAILY. TAKE WITH OR IMMEDIATELY FOLLOWING A MEAL (EVENING), Disp: 30 tablet, Rfl: 0   Oxcarbazepine (TRILEPTAL) 300 MG tablet, Take 2 tablets (600 mg total) by mouth 2 (two) times daily., Disp: 360 tablet, Rfl: 4   phenytoin (DILANTIN) 100 MG ER capsule, Take 1 capsule (100 mg total) by mouth 3 (three) times daily., Disp: 270 capsule, Rfl: 4   potassium chloride SA (KLOR-CON M) 20 MEQ  tablet, Take 1 tablet (20 mEq total) by mouth 3 (three) times a week. Take every Mon, Wed and Fri with Lasix, Disp: 30 tablet, Rfl: 4   pregabalin (LYRICA) 300 MG capsule, TAKE 1 CAPSULE (300 MG TOTAL) BY MOUTH AT BEDTIME., Disp: 30 capsule, Rfl: 2   sacubitril-valsartan (ENTRESTO) 49-51 MG, Take 1 tablet by mouth 2 (two) times daily., Disp: 60 tablet, Rfl: 3   spironolactone (ALDACTONE) 25 MG tablet, Take 1 tablet (25 mg total) by mouth at bedtime., Disp: 90 tablet, Rfl: 3   warfarin (COUMADIN) 5 MG tablet, Take 1-1.5 tablets (5-7.5 mg total) by mouth daily as directed by Coumadin clinic, Disp: 40 tablet, Rfl: 3  Past Medical History: Past Medical History:  Diagnosis Date   Chronic systolic CHF (congestive heart failure) (HCC) 06/18/2015   Diabetes mellitus without complication ([place])    Hemorrhagic stroke ([place])    Hyperlipidemia    Hypertension    Nonischemic cardiomyopathy ([place]) Noted as early as 2007   Per chart review (cards consult note 2011), EF of 40% in 2007, down to 20-25% in 2011    Tobacco Use: Social History   Tobacco Use  Smoking Status Never  Smokeless Tobacco Never    Labs: Review Flowsheet  More data exists      Latest Ref Rng & Units 01/29/2021 04/19/2021 06/08/2021 09/21/2021 11/15/2021  Labs for ITP Cardiac and Pulmonary Rehab  Cholestrol 0 - 200 mg/dL - - 107  133  -  LDL (calc) 0 - 99 mg/dL - - 31  56  -  HDL-C >40 mg/dL - - 49  47  -  Trlycerides <150 mg/dL - - 166  149  -  Hemoglobin A1c 4.0 - 5.6 % 8.2  7.7  - - 8.7     Capillary Blood Glucose: Lab Results  Component Value Date   GLUCAP 152 (H) 11/26/2021   GLUCAP 140 (H) 11/21/2021   GLUCAP 194 (H) 11/19/2021   GLUCAP 165 (H) 11/19/2021   GLUCAP 165 (H) 11/13/2021     Exercise Target Goals: Exercise Program Goal: Individual exercise prescription set using results from initial 6 min walk test and THRR while considering  patient's activity barriers and safety.   Exercise Prescription  Goal: Initial exercise prescription builds to 30-45 minutes a day of aerobic activity, 2-3 days per week.  Home exercise guidelines will be given to patient during program as part of exercise prescription that the participant will acknowledge.  Activity Barriers & Risk Stratification:  Activity Barriers & Cardiac Risk Stratification - 11/13/21 1220       Activity Barriers & Cardiac Risk Stratification   Activity Barriers Back Problems;Deconditioning;Neck/Spine Problems;Decreased Ventricular Function;Balance Concerns;Shortness of Breath;Other (comment)    Comments Neuropathy in hands and feet    Cardiac Risk Stratification High  6 Minute Walk:  6 Minute Walk     Row Name 11/13/21 0955         6 Minute Walk   Phase Initial     Distance 770 feet     Walk Time 6 minutes     # of Rest Breaks 1  5:05-105mn, lightheaded and fatigue     MPH 1.46     METS 3.8     RPE 13     Perceived Dyspnea  2     VO2 Peak 13.3     Symptoms Yes (comment)     Comments Lightheadedness, fatigue, RPD 2     Resting HR 64 bpm     Resting BP 110/70     Resting Oxygen Saturation  98 %     Exercise Oxygen Saturation  during 6 min walk 100 %     Max Ex. HR 70 bpm     Max Ex. BP 110/70     2 Minute Post BP 105/70              Oxygen Initial Assessment:   Oxygen Re-Evaluation:   Oxygen Discharge (Final Oxygen Re-Evaluation):   Initial Exercise Prescription:  Initial Exercise Prescription - 11/13/21 1200       Date of Initial Exercise RX and Referring Provider   Date 11/13/21    Referring Provider DRowland LatheMD    Expected Discharge Date 01/18/22      NuStep   Level 1    SPM 75    Minutes 25    METs 2.5      Prescription Details   Frequency (times per week) 3    Duration Progress to 10 minutes continuous walking  at current work load and total walking time to 30-45 min      Intensity   THRR 40-80% of Max Heartrate 73-146    Ratings of Perceived Exertion 11-13     Perceived Dyspnea 0-4      Progression   Progression Continue progressive overload as per policy without signs/symptoms or physical distress.      Resistance Training   Training Prescription Yes    Weight 3lbs    Reps 10-15             Perform Capillary Blood Glucose checks as needed.  Exercise Prescription Changes:   Exercise Prescription Changes     Row Name 11/19/21 0818             Response to Exercise   Blood Pressure (Admit) 115/74       Blood Pressure (Exercise) 108/72       Blood Pressure (Exit) 121/83       Heart Rate (Admit) 76 bpm       Heart Rate (Exercise) 75 bpm       Heart Rate (Exit) 69 bpm       Rating of Perceived Exertion (Exercise) 13       Perceived Dyspnea (Exercise) 0       Symptoms 0       Comments Pt first day in the CRP2 program       Duration Progress to 30 minutes of  aerobic without signs/symptoms of physical distress       Intensity THRR unchanged         Progression   Progression Continue to progress workloads to maintain intensity without signs/symptoms of physical distress.       Average METs 1  Resistance Training   Training Prescription Yes       Weight 3lbs       Reps 10-15       Time 10 Minutes         NuStep   Level 1       SPM 75       Minutes 30       METs 1                Exercise Comments:   Exercise Comments     Row Name 11/19/21 0822           Exercise Comments Pt first day in the CRP2 program. Pt tolerated low level activity well with an average MET level od 1.0. Pt is learning his THRR, RPE and Ex Rx.                Exercise Goals and Review:   Exercise Goals     Row Name 11/13/21 1223             Exercise Goals   Increase Physical Activity Yes       Intervention Provide advice, education, support and counseling about physical activity/exercise needs.;Develop an individualized exercise prescription for aerobic and resistive training based on initial evaluation findings,  risk stratification, comorbidities and participant's personal goals.       Expected Outcomes Long Term: Exercising regularly at least 3-5 days a week.;Long Term: Add in home exercise to make exercise part of routine and to increase amount of physical activity.;Short Term: Attend rehab on a regular basis to increase amount of physical activity.       Increase Strength and Stamina Yes       Intervention Provide advice, education, support and counseling about physical activity/exercise needs.;Develop an individualized exercise prescription for aerobic and resistive training based on initial evaluation findings, risk stratification, comorbidities and participant's personal goals.       Expected Outcomes Short Term: Increase workloads from initial exercise prescription for resistance, speed, and METs.;Short Term: Perform resistance training exercises routinely during rehab and add in resistance training at home;Long Term: Improve cardiorespiratory fitness, muscular endurance and strength as measured by increased METs and functional capacity (6MWT)       Able to understand and use rate of perceived exertion (RPE) scale Yes       Intervention Provide education and explanation on how to use RPE scale       Expected Outcomes Long Term:  Able to use RPE to guide intensity level when exercising independently;Short Term: Able to use RPE daily in rehab to express subjective intensity level       Knowledge and understanding of Target Heart Rate Range (THRR) Yes       Intervention Provide education and explanation of THRR including how the numbers were predicted and where they are located for reference       Expected Outcomes Short Term: Able to state/look up THRR;Long Term: Able to use THRR to govern intensity when exercising independently;Short Term: Able to use daily as guideline for intensity in rehab       Understanding of Exercise Prescription Yes       Intervention Provide education, explanation, and written  materials on patient's individual exercise prescription       Expected Outcomes Long Term: Able to explain home exercise prescription to exercise independently;Short Term: Able to explain program exercise prescription  Exercise Goals Re-Evaluation :  Exercise Goals Re-Evaluation     Row Name 11/19/21 0820             Exercise Goal Re-Evaluation   Exercise Goals Review Increase Physical Activity;Increase Strength and Stamina;Able to understand and use rate of perceived exertion (RPE) scale;Knowledge and understanding of Target Heart Rate Range (THRR);Understanding of Exercise Prescription       Comments Pt first day in the CRP2 program. Pt tolerated low level activity well with an average MEt level od 1.0. Pt is learning his THRR, RPE and Ex Rx.       Expected Outcomes Will continue to monitor pt and progress workloads as tolerated without sign or symptom                Discharge Exercise Prescription (Final Exercise Prescription Changes):  Exercise Prescription Changes - 11/19/21 0818       Response to Exercise   Blood Pressure (Admit) 115/74    Blood Pressure (Exercise) 108/72    Blood Pressure (Exit) 121/83    Heart Rate (Admit) 76 bpm    Heart Rate (Exercise) 75 bpm    Heart Rate (Exit) 69 bpm    Rating of Perceived Exertion (Exercise) 13    Perceived Dyspnea (Exercise) 0    Symptoms 0    Comments Pt first day in the CRP2 program    Duration Progress to 30 minutes of  aerobic without signs/symptoms of physical distress    Intensity THRR unchanged      Progression   Progression Continue to progress workloads to maintain intensity without signs/symptoms of physical distress.    Average METs 1      Resistance Training   Training Prescription Yes    Weight 3lbs    Reps 10-15    Time 10 Minutes      NuStep   Level 1    SPM 75    Minutes 30    METs 1             Nutrition:  Target Goals: Understanding of nutrition guidelines, daily  intake of sodium <1568m, cholesterol <2057m calories 30% from fat and 7% or less from saturated fats, daily to have 5 or more servings of fruits and vegetables.  Biometrics:  Pre Biometrics - 11/13/21 0913       Pre Biometrics   Waist Circumference 33.5 inches    Hip Circumference 37.5 inches    Waist to Hip Ratio 0.89 %    Triceps Skinfold 9 mm    % Body Fat 18.9 %    Grip Strength 40 kg    Flexibility --   Not done due to pt reporting back pain   Single Leg Stand 17.31 seconds              Nutrition Therapy Plan and Nutrition Goals:  Nutrition Therapy & Goals - 11/30/21 1049       Nutrition Therapy   Diet Heart Healthy/Carbohydrate Consistent Diet    Drug/Food Interactions Coumadin/Vit K;Statins/Certain Fruits      Personal Nutrition Goals   Nutrition Goal Patient to choose a daily variety of fruit, vegetables, whole grains, lean protein/plant protein, nonfat dairy as part of heart healthy lifestyle    Personal Goal #2 Patient to limit to <150054mf sodium daily    Personal Goal #3 Patient to identify and limit food sources of saturated fat, trans fat, sodium, and refined carbohydrates    Comments 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 Mousa reports some decreased appetite. His A1c has improved to 8.7 from 15.5 1 year ago; however, it was down to 7.7 on 04/19/2021. He continues Fiasp 6-10 units with meals, Tresiba 20 units, Farxiga 20 units, metformin 2x/day with meals.He continues warfarin. He is being worked up through the [facility] transplant team. He reports good understanding and confidence of carbohydrate counting. Reviewed eating frequency, high protein/high fiber snacks, and steps for treating low blood sugars.      Intervention Plan   Intervention Prescribe, educate and counsel regarding individualized specific dietary modifications aiming towards targeted core components such as weight, hypertension, lipid management,  diabetes, heart failure and other comorbidities.;Nutrition handout(s) given to patient.    Expected Outcomes Short Term Goal: Understand basic principles of dietary content, such as calories, fat, sodium, cholesterol and nutrients.;Long Term Goal: Adherence to prescribed nutrition plan.             Nutrition Assessments:  MEDIFICTS Score Key: ?70 Need to make dietary changes  40-70 Heart Healthy Diet ? 40 Therapeutic Level Cholesterol Diet    Picture Your Plate Scores: <06 Unhealthy dietary pattern with much room for improvement. 41-50 Dietary pattern unlikely to meet recommendations for good health and room for improvement. 51-60 More healthful dietary pattern, with some room for improvement.  >60 Healthy dietary pattern, although there may be some specific behaviors that could be improved.    Nutrition Goals Re-Evaluation:  Nutrition Goals Re-Evaluation     [place] Name 11/19/21 1135 11/30/21 1049           Goals   Current Weight 165 lb 12.6 oz (75.2 kg) 163 lb 5.8 oz (74.1 kg)      Comment Triglycerides 166, A1c 7.8 Patient is up 4.6# since orientation weight 11/13/2021.      Expected Outcome Expect improvement in blood sugar control and lipid panel with heart healthy diet changes/intervention. Goals in progress. 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 Ephriam reports some decreased appetite. His A1c has improved to 8.7 from 15.5 1 year ago; however, it was down to 7.7 on 04/19/2021. He continues Fiasp 6-10 units with meals, Tresiba 20 units, Farxiga 20 units, metformin 2x/day with meals.He continues warfarin. He is being worked up through the [facility] transplant team. He reports good understanding and confidence of carbohydrate counting. Reviewed eating frequency, high protein/high fiber snacks, and steps for treating low blood sugars.               Nutrition Goals Re-Evaluation:  Nutrition Goals Re-Evaluation     [place] Name  11/19/21 1135 11/30/21 1049           Goals   Current Weight 165 lb 12.6 oz (75.2 kg) 163 lb 5.8 oz (74.1 kg)      Comment Triglycerides 166, A1c 7.8 Patient is up 4.6# since orientation weight 11/13/2021.      Expected Outcome Expect improvement in blood sugar control and lipid panel with heart healthy diet changes/intervention. Goals in progress. 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 Jaramiah reports some decreased appetite. His A1c has improved to 8.7 from 15.5 1 year ago; however, it was down to 7.7 on 04/19/2021. He continues Fiasp 6-10 units with meals, Tresiba 20 units, Farxiga 20 units, metformin 2x/day with meals.He continues warfarin. He is being worked up through the [facility] transplant team. He reports good understanding and confidence of carbohydrate counting.  Reviewed eating frequency, high protein/high fiber snacks, and steps for treating low blood sugars.               Nutrition Goals Discharge (Final Nutrition Goals Re-Evaluation):  Nutrition Goals Re-Evaluation - 11/30/21 1049       Goals   Current Weight 163 lb 5.8 oz (74.1 kg)    Comment Patient is up 4.6# since orientation weight 11/13/2021.    Expected Outcome Goals in progress. 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 Alondra reports some decreased appetite. His A1c has improved to 8.7 from 15.5 1 year ago; however, it was down to 7.7 on 04/19/2021. He continues Fiasp 6-10 units with meals, Tresiba 20 units, Farxiga 20 units, metformin 2x/day with meals.He continues warfarin. He is being worked up through the [facility] transplant team. He reports good understanding and confidence of carbohydrate counting. Reviewed eating frequency, high protein/high fiber snacks, and steps for treating low blood sugars.             Psychosocial: Target Goals: Acknowledge presence or absence of significant depression and/or stress, maximize coping  skills, provide positive support system. Participant is able to verbalize types and ability to use techniques and skills needed for reducing stress and depression.  Initial Review & Psychosocial Screening:  Initial Psych Review & Screening - 11/13/21 1206       Initial Review   Current issues with History of Depression;Current Depression;Current Sleep Concerns;Current Stress Concerns    Source of Stress Concerns Chronic Illness;Transportation;Financial    Comments Patient reports expirencing low level depression, states current antidepressants are effective however he could benefit from counseling, good support sysytem with wife and brother and law who is a Theme park manager.      Family Dynamics   Good Support System? Yes   Marrell states his spouse is a good support for him, physically, financially and emotionally, he also has good support in his brother in law who is a Theme park manager.     Barriers   Psychosocial barriers to participate in program The patient should benefit from training in stress management and relaxation.      Screening Interventions   Interventions Encouraged to exercise;To provide support and resources with identified psychosocial needs;Provide feedback about the scores to participant    Expected Outcomes Short Term goal: Identification and review with participant of any Quality of Life or Depression concerns found by scoring the questionnaire.;Long Term goal: The participant improves quality of Life and PHQ9 Scores as seen by post scores and/or verbalization of changes;Long Term Goal: Stressors or current issues are controlled or eliminated.;Short Term goal: Utilizing psychosocial counselor, staff and physician to assist with identification of specific Stressors or current issues interfering with healing process. Setting desired goal for each stressor or current issue identified.             Quality of Life Scores:  Quality of Life - 11/13/21 1211       Quality of Life   Select  Quality of Life      Quality of Life Scores   Health/Function Pre 15.23 %    Socioeconomic Pre 8.29 %    Psych/Spiritual Pre 20.57 %    Family Pre 21 %    GLOBAL Pre 15.59 %            Scores of 19 and below usually indicate a poorer quality of life in these areas.  A difference of  2-3 points is a clinically meaningful  difference.  A difference of 2-3 points in the total score of the Quality of Life Index has been associated with significant improvement in overall quality of life, self-image, physical symptoms, and general health in studies assessing change in quality of life.  PHQ-9: Review Flowsheet  More data exists      11/13/2021 04/20/2021 02/05/2021 10/27/2020 04/26/2019  Depression screen PHQ 2/9  Decreased Interest 0 1 1 0 0  Down, Depressed, Hopeless _0 0  PHQ - 2 Score _1 0  Altered sleeping _2 - -  Tired, decreased energy _3 - -  Change in appetite _4 - -  Feeling bad or failure about yourself  0 0 1 - -  Trouble concentrating 1 0 0 - -  Moving slowly or fidgety/restless 1 0 0 - -  Suicidal thoughts 1 0 0 - -  PHQ-9 Score _5 - -  Difficult doing work/chores Somewhat difficult - - - -   Interpretation of Total Score  Total Score Depression Severity:  1-4 = Minimal depression, 5-9 = Mild depression, 10-14 = Moderate depression, 15-19 = Moderately severe depression, 20-27 = Severe depression   Psychosocial Evaluation and Intervention:  Psychosocial Evaluation - 12/05/21 0734       Psychosocial Evaluation & Interventions   Interventions Encouraged to exercise with the program and follow exercise prescription;Relaxation education;Stress management education    Comments Stacie who has a  medically complex history admits to having depressive thoughts and feels stressed due to their finances. Yazir is fairly new to the phase II and enjoys coming to exercise and has attended some of the education classes based upon his interest. He feels that this  has helped him feel better. Jerone is well supported by his wife and faith community as well as fellow participants.    Expected Outcomes Sigmund will report decrease in depresive thoughs with positive outlook on life.  Josefina Do will employ learned stress management tools he has learned in the workshop and videos    Continue Psychosocial Services  Follow up required by staff             Psychosocial Re-Evaluation:   Psychosocial Discharge (Final Psychosocial Re-Evaluation):   Vocational Rehabilitation: Provide vocational rehab assistance to qualifying candidates.   Vocational Rehab Evaluation & Intervention:  Vocational Rehab - 11/13/21 1217       Initial Vocational Rehab Evaluation & Intervention   Assessment shows need for Vocational Rehabilitation No   unemployed, interested in obtaining disability services            Education: Education Goals: Education classes will be provided on a weekly basis, covering required topics. Participant will state understanding/return demonstration of topics presented.    Education     Row Name 11/28/21 0900     Education   Cardiac Education Topics [employer]   Educator Dietitian   Weekly Topic Adding Flavor - Sodium-Free   Instruction Review Code 1- Verbalizes Understanding   Class Start Time 0815   Class Stop Time 6962   Class Time Calculation (min) 42 min    Row Name 11/30/21 0800     Education   Cardiac Education Topics Pritikin   Tax inspector General Education   General Education Heart Disease Risk Reduction   Instruction Review Code 1- [employer]  Understanding   Class Start Time 0815   Class Stop Time 0855   Class Time Calculation (min) 40 min            Core Videos: Exercise    Move It!  Clinical staff conducted group or individual video education with verbal and written material and guidebook.  Patient learns the  recommended Pritikin exercise program. Exercise with the goal of living a long, healthy life. Some of the health benefits of exercise include controlled diabetes, healthier blood pressure levels, improved cholesterol levels, improved heart and lung capacity, improved sleep, and better body composition. Everyone should speak with their doctor before starting or changing an exercise routine.  Biomechanical Limitations Clinical staff conducted group or individual video education with verbal and written material and guidebook.  Patient learns how biomechanical limitations can impact exercise and how we can mitigate and possibly overcome limitations to have an impactful and balanced exercise routine.  Body Composition Clinical staff conducted group or individual video education with verbal and written material and guidebook.  Patient learns that body composition (ratio of muscle mass to fat mass) is a key component to assessing overall fitness, rather than body weight alone. Increased fat mass, especially visceral belly fat, can put [place] at increased risk for metabolic syndrome, type 2 diabetes, heart disease, and even death. It is recommended to combine diet and exercise (cardiovascular and resistance training) to improve your body composition. Seek guidance from your physician and exercise physiologist before implementing an exercise routine.  Exercise Action Plan Clinical staff conducted group or individual video education with verbal and written material and guidebook.  Patient learns the recommended strategies to achieve and enjoy long-term exercise adherence, including variety, self-motivation, self-efficacy, and positive decision making. Benefits of exercise include fitness, good health, weight management, more energy, better sleep, less stress, and overall well-being.  Medical   Heart Disease Risk Reduction Clinical staff conducted group or individual video education with verbal and written material  and guidebook.  Patient learns our heart is our most vital organ as it circulates oxygen, nutrients, white blood cells, and hormones throughout the entire body, and carries waste away. Data supports a plant-based eating plan like the Pritikin Program for its effectiveness in slowing progression of and reversing heart disease. The video provides a number of recommendations to address heart disease.   Metabolic Syndrome and Belly Fat  Clinical staff conducted group or individual video education with verbal and written material and guidebook.  Patient learns what metabolic syndrome is, how it leads to heart disease, and how one can reverse it and keep it from coming back. You have metabolic syndrome if you have 3 of the following 5 criteria: abdominal obesity, high blood pressure, high triglycerides, low HDL cholesterol, and high blood sugar.  Hypertension and Heart Disease Clinical staff conducted group or individual video education with verbal and written material and guidebook.  Patient learns that high blood pressure, or hypertension, is very common in the [place]. Hypertension is largely due to excessive salt intake, but other important risk factors include being overweight, physical inactivity, drinking too much alcohol, smoking, and not eating enough potassium from fruits and vegetables. High blood pressure is a leading risk factor for heart attack, stroke, congestive heart failure, dementia, kidney failure, and premature death. Long-term effects of excessive salt intake include stiffening of the arteries and thickening of heart muscle and organ damage. Recommendations include ways to reduce hypertension and the risk of heart disease.  Diseases of Our Time -  Focusing on Diabetes Clinical staff conducted group or individual video education with verbal and written material and guidebook.  Patient learns why the best way to stop diseases of our time is prevention, through food and other lifestyle  changes. Medicine (such as prescription pills and surgeries) is often only a Band-Aid on the problem, not a long-term solution. Most common diseases of our time include obesity, type 2 diabetes, hypertension, heart disease, and cancer. The Pritikin Program is recommended and has been proven to help reduce, reverse, and/or prevent the damaging effects of metabolic syndrome.  Nutrition   Overview of the Pritikin Eating Plan  Clinical staff conducted group or individual video education with verbal and written material and guidebook.  Patient learns about the [place] for disease risk reduction. The [place] emphasizes a wide variety of unrefined, minimally-processed carbohydrates, like fruits, vegetables, whole grains, and legumes. Go, Caution, and Stop food choices are explained. Plant-based and lean animal proteins are emphasized. Rationale provided for low sodium intake for blood pressure control, low added sugars for blood sugar stabilization, and low added fats and oils for coronary artery disease risk reduction and weight management.  Calorie Density  Clinical staff conducted group or individual video education with verbal and written material and guidebook.  Patient learns about calorie density and how it impacts the Pritikin Eating Plan. Knowing the characteristics of the food you choose will help you decide whether those foods will lead to weight gain or weight loss, and whether you want to consume more or less of them. Weight loss is usually a side effect of the Pritikin Eating Plan because of its focus on low calorie-dense foods.  Label Reading  Clinical staff conducted group or individual video education with verbal and written material and guidebook.  Patient learns about the Pritikin recommended label reading guidelines and corresponding recommendations regarding calorie density, added sugars, sodium content, and whole grains.  Dining Out - Part 1  Clinical staff  conducted group or individual video education with verbal and written material and guidebook.  Patient learns that restaurant meals can be sabotaging because they can be so high in calories, fat, sodium, and/or sugar. Patient learns recommended strategies on how to positively address this and avoid unhealthy pitfalls.  Facts on Fats  Clinical staff conducted group or individual video education with verbal and written material and guidebook.  Patient learns that lifestyle modifications can be just as effective, if not more so, as many medications for lowering your risk of heart disease. A Pritikin lifestyle can help to reduce your risk of inflammation and atherosclerosis (cholesterol build-up, or plaque, in the artery walls). Lifestyle interventions such as dietary choices and physical activity address the cause of atherosclerosis. A review of the types of fats and their impact on blood cholesterol levels, along with dietary recommendations to reduce fat intake is also included.  Nutrition Action Plan  Clinical staff conducted group or individual video education with verbal and written material and guidebook.  Patient learns how to incorporate Pritikin recommendations into their lifestyle. Recommendations include planning and keeping personal health goals in mind as an important part of their success.  Healthy Mind-Set    Healthy Minds, Bodies, Hearts  Clinical staff conducted group or individual video education with verbal and written material and guidebook.  Patient learns how to identify when they are stressed. Video will discuss the impact of that stress, as well as the many benefits of stress management. Patient will also be introduced to stress  management techniques. The way we think, act, and feel has an impact on our hearts.  How Our Thoughts Can Heal Our Hearts  Clinical staff conducted group or individual video education with verbal and written material and guidebook.  Patient learns that  negative thoughts can cause depression and anxiety. This can result in negative lifestyle behavior and serious health problems. Cognitive behavioral therapy is an effective method to help control our thoughts in order to change and improve our emotional outlook.  Additional Videos:  Exercise    Improving Performance  Clinical staff conducted group or individual video education with verbal and written material and guidebook.  Patient learns to use a non-linear approach by alternating intensity levels and lengths of time spent exercising to help burn more calories and lose more body fat. Cardiovascular exercise helps improve heart health, metabolism, hormonal balance, blood sugar control, and recovery from fatigue. Resistance training improves strength, endurance, balance, coordination, reaction time, metabolism, and muscle mass. Flexibility exercise improves circulation, posture, and balance. Seek guidance from your physician and exercise physiologist before implementing an exercise routine and learn your capabilities and proper form for all exercise.  Introduction to Yoga  Clinical staff conducted group or individual video education with verbal and written material and guidebook.  Patient learns about yoga, a discipline of the coming together of mind, breath, and body. The benefits of yoga include improved flexibility, improved range of motion, better posture and core strength, increased lung function, weight loss, and positive self-image. Yoga's heart health benefits include lowered blood pressure, healthier heart rate, decreased cholesterol and triglyceride levels, improved immune function, and reduced stress. Seek guidance from your physician and exercise physiologist before implementing an exercise routine and learn your capabilities and proper form for all exercise.  Medical   Aging: Enhancing Your Quality of Life  Clinical staff conducted group or individual video education with verbal and  written material and guidebook.  Patient learns key strategies and recommendations to stay in good physical health and enhance quality of life, such as prevention strategies, having an advocate, securing a [place], and keeping a list of medications and system for tracking them. It also discusses how to avoid risk for bone loss.  Biology of Weight Control  Clinical staff conducted group or individual video education with verbal and written material and guidebook.  Patient learns that weight gain occurs because we consume more calories than we burn (eating more, moving less). Even if your body weight is normal, you may have higher ratios of fat compared to muscle mass. Too much body fat puts you at increased risk for cardiovascular disease, heart attack, stroke, type 2 diabetes, and obesity-related cancers. In addition to exercise, following the [place] can help reduce your risk.  Decoding Lab Results  Clinical staff conducted group or individual video education with verbal and written material and guidebook.  Patient learns that lab test reflects one measurement whose values change over time and are influenced by many factors, including medication, stress, sleep, exercise, food, hydration, pre-existing medical conditions, and more. It is recommended to use the knowledge from this video to become more involved with your lab results and evaluate your numbers to speak with your doctor.   Diseases of Our Time - Overview  Clinical staff conducted group or individual video education with verbal and written material and guidebook.  Patient learns that according to the CDC, 50% to 70% of chronic diseases (such as obesity, type 2 diabetes, elevated  lipids, hypertension, and heart disease) are avoidable through lifestyle improvements including healthier food choices, listening to satiety cues, and increased physical activity.  Sleep Disorders Clinical staff  conducted group or individual video education with verbal and written material and guidebook.  Patient learns how good quality and duration of sleep are important to overall health and well-being. Patient also learns about sleep disorders and how they impact health along with recommendations to address them, including discussing with a physician.  Nutrition  Dining Out - Part 2 Clinical staff conducted group or individual video education with verbal and written material and guidebook.  Patient learns how to plan ahead and communicate in order to maximize their dining experience in a healthy and nutritious manner. Included are recommended food choices based on the type of restaurant the patient is visiting.   Fueling a Best boy conducted group or individual video education with verbal and written material and guidebook.  There is a strong connection between our food choices and our health. Diseases like obesity and type 2 diabetes are very prevalent and are in large-part due to lifestyle choices. The Pritikin Eating Plan provides plenty of food and hunger-curbing satisfaction. It is easy to follow, affordable, and helps reduce health risks.  Menu Workshop  Clinical staff conducted group or individual video education with verbal and written material and guidebook.  Patient learns that restaurant meals can sabotage health goals because they are often packed with calories, fat, sodium, and sugar. Recommendations include strategies to plan ahead and to communicate with the manager, chef, or server to help order a healthier meal.  Planning Your Eating Strategy  Clinical staff conducted group or individual video education with verbal and written material and guidebook.  Patient learns about the [place] and its benefit of reducing the risk of disease. The [place] does not focus on calories. Instead, it emphasizes high-quality, nutrient-rich foods. By knowing  the characteristics of the foods, we choose, we can determine their calorie density and make informed decisions.  Targeting Your Nutrition Priorities  Clinical staff conducted group or individual video education with verbal and written material and guidebook.  Patient learns that lifestyle habits have a tremendous impact on disease risk and progression. This video provides eating and physical activity recommendations based on your personal health goals, such as reducing LDL cholesterol, losing weight, preventing or controlling type 2 diabetes, and reducing high blood pressure.  Vitamins and Minerals  Clinical staff conducted group or individual video education with verbal and written material and guidebook.  Patient learns different ways to obtain key vitamins and minerals, including through a recommended healthy diet. It is important to discuss all supplements you take with your doctor.   Healthy Mind-Set    Smoking Cessation  Clinical staff conducted group or individual video education with verbal and written material and guidebook.  Patient learns that cigarette smoking and tobacco addiction pose a serious health risk which affects millions of people. Stopping smoking will significantly reduce the risk of heart disease, lung disease, and many forms of cancer. Recommended strategies for quitting are covered, including working with your doctor to develop a successful plan.  Culinary   Becoming a Financial trader conducted group or individual video education with verbal and written material and guidebook.  Patient learns that cooking at home can be healthy, cost-effective, quick, and puts them in control. Keys to cooking healthy recipes will include looking at your recipe, assessing your equipment needs, planning  ahead, making it simple, choosing cost-effective seasonal ingredients, and limiting the use of added fats, salts, and sugars.  Cooking - Breakfast and Snacks  Clinical  staff conducted group or individual video education with verbal and written material and guidebook.  Patient learns how important breakfast is to satiety and nutrition through the entire day. Recommendations include key foods to eat during breakfast to help stabilize blood sugar levels and to prevent overeating at meals later in the day. Planning ahead is also a key component.  Cooking - Human resources officer conducted group or individual video education with verbal and written material and guidebook.  Patient learns eating strategies to improve overall health, including an approach to cook more at home. Recommendations include thinking of animal protein as a side on your plate rather than center stage and focusing instead on lower calorie dense options like vegetables, fruits, whole grains, and plant-based proteins, such as beans. Making sauces in large quantities to freeze for later and leaving the skin on your vegetables are also recommended to maximize your experience.  Cooking - Healthy Salads and Dressing Clinical staff conducted group or individual video education with verbal and written material and guidebook.  Patient learns that vegetables, fruits, whole grains, and legumes are the foundations of the [place]. Recommendations include how to incorporate each of these in flavorful and healthy salads, and how to create homemade salad dressings. Proper handling of ingredients is also covered. Cooking - Soups and [employer] - Soups and Desserts Clinical staff conducted group or individual video education with verbal and written material and guidebook.  Patient learns that Pritikin soups and desserts make for easy, nutritious, and delicious snacks and meal components that are low in sodium, fat, sugar, and calorie density, while high in vitamins, minerals, and filling fiber. Recommendations include simple and healthy ideas for soups and desserts.   Overview     The  Pritikin Solution Program Overview Clinical staff conducted group or individual video education with verbal and written material and guidebook.  Patient learns that the results of the [place] Program have been documented in more than 100 articles published in peer-reviewed journals, and the benefits include reducing risk factors for (and, in some cases, even reversing) high cholesterol, high blood pressure, type 2 diabetes, obesity, and more! An overview of the three key pillars of the Pritikin Program will be covered: eating well, doing regular exercise, and having a healthy mind-set.  WORKSHOPS  Exercise: Exercise Basics: Building Your Action Plan Clinical staff led group instruction and group discussion with PowerPoint presentation and patient guidebook. To enhance the learning environment the use of posters, models and videos may be added. At the conclusion of this workshop, patients will comprehend the difference between physical activity and exercise, as well as the benefits of incorporating both, into their routine. Patients will understand the FITT (Frequency, Intensity, Time, and Type) principle and how to use it to build an exercise action plan. In addition, safety concerns and other considerations for exercise and cardiac rehab will be addressed by the presenter. The purpose of this lesson is to promote a comprehensive and effective weekly exercise routine in order to improve patients' overall level of fitness.   Managing Heart Disease: Your Path to a Healthier Heart Clinical staff led group instruction and group discussion with PowerPoint presentation and patient guidebook. To enhance the learning environment the use of posters, models and videos may be added.At the conclusion of this workshop, patients will understand  the anatomy and physiology of the heart. Additionally, they will understand how Pritikin's three pillars impact the risk factors, the progression, and the management  of heart disease.  The purpose of this lesson is to provide a high-level overview of the heart, heart disease, and how the Pritikin lifestyle positively impacts risk factors.  Exercise Biomechanics Clinical staff led group instruction and group discussion with PowerPoint presentation and patient guidebook. To enhance the learning environment the use of posters, models and videos may be added. Patients will learn how the structural parts of their bodies function and how these functions impact their daily activities, movement, and exercise. Patients will learn how to promote a neutral spine, learn how to manage pain, and identify ways to improve their physical movement in order to promote healthy living. The purpose of this lesson is to expose patients to common physical limitations that impact physical activity. Participants will learn practical ways to adapt and manage aches and pains, and to minimize their effect on regular exercise. Patients will learn how to maintain good posture while sitting, walking, and lifting.  Balance Training and Fall Prevention  Clinical staff led group instruction and group discussion with PowerPoint presentation and patient guidebook. To enhance the learning environment the use of posters, models and videos may be added. At the conclusion of this workshop, patients will understand the importance of their sensorimotor skills (vision, proprioception, and the vestibular system) in maintaining their ability to balance as they age. Patients will apply a variety of balancing exercises that are appropriate for their current level of function. Patients will understand the common causes for poor balance, possible solutions to these problems, and ways to modify their physical environment in order to minimize their fall risk. The purpose of this lesson is to teach patients about the importance of maintaining balance as they age and ways to minimize their risk of  falling.  WORKSHOPS   Nutrition:  Fueling a Scientist, research (physical sciences) led group instruction and group discussion with PowerPoint presentation and patient guidebook. To enhance the learning environment the use of posters, models and videos may be added. Patients will review the foundational principles of the [place] and understand what constitutes a serving size in each of the food groups. Patients will also learn Pritikin-friendly foods that are better choices when away from home and review make-ahead meal and snack options. Calorie density will be reviewed and applied to three nutrition priorities: weight maintenance, weight loss, and weight gain. The purpose of this lesson is to reinforce (in a group setting) the key concepts around what patients are recommended to eat and how to apply these guidelines when away from home by planning and selecting Pritikin-friendly options. Patients will understand how calorie density may be adjusted for different weight management goals.  Mindful Eating  Clinical staff led group instruction and group discussion with PowerPoint presentation and patient guidebook. To enhance the learning environment the use of posters, models and videos may be added. Patients will briefly review the concepts of the [place] and the importance of low-calorie dense foods. The concept of mindful eating will be introduced as well as the importance of paying attention to internal hunger signals. Triggers for non-hunger eating and techniques for dealing with triggers will be explored. The purpose of this lesson is to provide patients with the opportunity to review the basic principles of the [place], discuss the value of eating mindfully and how to measure internal cues of hunger and fullness  using the Hunger Scale. Patients will also discuss reasons for non-hunger eating and learn strategies to use for controlling emotional eating.  Targeting Your  Nutrition Priorities Clinical staff led group instruction and group discussion with PowerPoint presentation and patient guidebook. To enhance the learning environment the use of posters, models and videos may be added. Patients will learn how to determine their genetic susceptibility to disease by reviewing their family history. Patients will gain insight into the importance of diet as part of an overall healthy lifestyle in mitigating the impact of genetics and other environmental insults. The purpose of this lesson is to provide patients with the opportunity to assess their personal nutrition priorities by looking at their family history, their own health history and current risk factors. Patients will also be able to discuss ways of prioritizing and modifying the [place] for their highest risk areas  Menu  Clinical staff led group instruction and group discussion with PowerPoint presentation and patient guidebook. To enhance the learning environment the use of posters, models and videos may be added. Using menus brought in from [employer], or printed from [employer], patients will apply the [place] dining out guidelines that were presented in the [employer] video. Patients will also be able to practice these guidelines in a variety of provided scenarios. The purpose of this lesson is to provide patients with the opportunity to practice hands-on learning of the [place] with actual menus and practice scenarios.  Label Reading Clinical staff led group instruction and group discussion with PowerPoint presentation and patient guidebook. To enhance the learning environment the use of posters, models and videos may be added. Patients will review and discuss the [facility] label reading guidelines presented in [facility]'s Label Reading Educational series video. Using fool labels brought in from local grocery stores and markets, patients will apply the  label reading guidelines and determine if the packaged food meet the [facility] guidelines. The purpose of this lesson is to provide patients with the opportunity to review, discuss, and practice hands-on learning of the [facility] Label Reading guidelines with actual packaged food labels. [place] Workshops are designed to teach patients ways to prepare quick, simple, and affordable recipes at home. The importance of nutrition's role in chronic disease risk reduction is reflected in its emphasis in the overall [facility] program. By learning how to prepare essential core [facility] Eating Plan recipes, patients will increase control over what they eat; be able to customize the flavor of foods without the use of added salt, sugar, or fat; and improve the quality of the food they consume. By learning a set of core recipes which are easily assembled, quickly prepared, and affordable, patients are more likely to prepare more healthy foods at home. These workshops focus on convenient breakfasts, simple entres, side dishes, and desserts which can be prepared with minimal effort and are consistent with nutrition recommendations for cardiovascular risk reduction. Cooking [employer] are taught by a Engineer, materials (RD) who has been trained by the [employer]. The chef or RD has a clear understanding of the importance of minimizing - if not completely eliminating - added fat, sugar, and sodium in recipes. Throughout the series of [place] Workshop sessions, patients will learn about healthy ingredients and efficient methods of cooking to build confidence in their capability to prepare    [employer] weekly topics:  Adding Flavor- Sodium-Free  Fast and Healthy Breakfasts  Powerhouse  Plant-Based Proteins  Satisfying Salads and Dressings  Simple Sides and Sauces  International Cuisine-Spotlight on the Blue Zones  Delicious Desserts  Savory  Soups  Efficiency Cooking - Meals in a Snap  Tasty Appetizers and Snacks  Comforting Weekend Breakfasts  One-Pot Wonders   Fast Evening Meals  Easy Entertaining  Personalizing Your [facility] Plate  WORKSHOPS   Healthy Mindset (Psychosocial): New Thoughts, New Behaviors Clinical staff led group instruction and group discussion with PowerPoint presentation and patient guidebook. To enhance the learning environment the use of posters, models and videos may be added. Patients will learn and practice techniques for developing effective health and lifestyle goals. Patients will be able to effectively apply the goal setting process learned to develop at least one new personal goal.  The purpose of this lesson is to expose patients to a new skill set of behavior modification techniques such as techniques setting SMART goals, overcoming barriers, and achieving new thoughts and new behaviors.  Managing Moods and Relationships Clinical staff led group instruction and group discussion with PowerPoint presentation and patient guidebook. To enhance the learning environment the use of posters, models and videos may be added. Patients will learn how emotional and chronic stress factors can impact their health and relationships. They will learn healthy ways to manage their moods and utilize positive coping mechanisms. In addition, ICR patients will learn ways to improve communication skills. The purpose of this lesson is to expose patients to ways of understanding how one's mood and health are intimately connected. Developing a healthy outlook can help build positive relationships and connections with others. Patients will understand the importance of utilizing effective communication skills that include actively listening and being heard. They will learn and understand the importance of the "4 Cs" and especially Connections in fostering of a Healthy Mind-Set.  Healthy Sleep for a Healthy Heart Clinical staff led  group instruction and group discussion with PowerPoint presentation and patient guidebook. To enhance the learning environment the use of posters, models and videos may be added. At the conclusion of this workshop, patients will be able to demonstrate knowledge of the importance of sleep to overall health, well-being, and quality of life. They will understand the symptoms of, and treatments for, common sleep disorders. Patients will also be able to identify daytime and nighttime behaviors which impact sleep, and they will be able to apply these tools to help manage sleep-related challenges. The purpose of this lesson is to provide patients with a general overview of sleep and outline the importance of quality sleep. Patients will learn about a few of the most common sleep disorders. Patients will also be introduced to the concept of "sleep hygiene," and discover ways to self-manage certain sleeping problems through simple daily behavior changes. Finally, the workshop will motivate patients by clarifying the links between quality sleep and their goals of heart-healthy living.   Recognizing and Reducing Stress Clinical staff led group instruction and group discussion with PowerPoint presentation and patient guidebook. To enhance the learning environment the use of posters, models and videos may be added. At the conclusion of this workshop, patients will be able to understand the types of stress reactions, differentiate between acute and chronic stress, and recognize the impact that chronic stress has on their health. They will also be able to apply different coping mechanisms, such as reframing negative self-talk. Patients will have the opportunity to practice a variety of stress management techniques, such as deep abdominal breathing, progressive muscle relaxation, and/or guided imagery.  The purpose of this lesson is to educate patients on the role of stress in their lives and to provide healthy techniques for  coping with it.  Learning Barriers/Preferences:  Learning Barriers/Preferences - 11/13/21 1212       Learning Barriers/Preferences   Learning Barriers None    Learning Preferences Skilled Demonstration;Pictoral;Verbal Instruction;Individual Instruction;Written Material             Education Topics:  Knowledge Questionnaire Score:  Knowledge Questionnaire Score - 11/13/21 1213       Knowledge Questionnaire Score   Pre Score 18/24             Core Components/Risk Factors/Patient Goals at Admission:  Personal Goals and Risk Factors at Admission - 11/13/21 1213       Core Components/Risk Factors/Patient Goals on Admission    Weight Management Weight Maintenance;Yes    Intervention Weight Management: Provide education and appropriate resources to help participant work on and attain dietary goals.;Weight Management: Develop a combined nutrition and exercise program designed to reach desired caloric intake, while maintaining appropriate intake of nutrient and fiber, sodium and fats, and appropriate energy expenditure required for the weight goal.    Expected Outcomes Weight Maintenance: Understanding of the daily nutrition guidelines, which includes 25-35% calories from fat, 7% or less cal from saturated fats, less than 24m cholesterol, less than 1.5gm of sodium, & 5 or more servings of fruits and vegetables daily;Understanding recommendations for meals to include 15-35% energy as protein, 25-35% energy from fat, 35-60% energy from carbohydrates, less than 2058mof dietary cholesterol, 20-35 gm of total fiber daily    Diabetes Yes    Intervention Provide education about proper nutrition, including hydration, and aerobic/resistive exercise prescription along with prescribed medications to achieve blood glucose in normal ranges: Fasting glucose 65-99 mg/dL;Provide education about signs/symptoms and action to take for hypo/hyperglycemia.    Expected Outcomes Long Term: Attainment of  HbA1C < 7%.;Short Term: Participant verbalizes understanding of the signs/symptoms and immediate care of hyper/hypoglycemia, proper foot care and importance of medication, aerobic/resistive exercise and nutrition plan for blood glucose control.    Heart Failure Yes    Intervention Provide a combined exercise and nutrition program that is supplemented with education, support and counseling about heart failure. Directed toward relieving symptoms such as shortness of breath, decreased exercise tolerance, and extremity edema.    Expected Outcomes Improve functional capacity of life;Short term: Attendance in program 2-3 days a week with increased exercise capacity. Reported lower sodium intake. Reported increased fruit and vegetable intake. Reports medication compliance.;Short term: Daily weights obtained and reported for increase. Utilizing diuretic protocols set by physician.;Long term: Adoption of self-care skills and reduction of barriers for early signs and symptoms recognition and intervention leading to self-care maintenance.    Hypertension Yes    Intervention Provide education on lifestyle modifcations including regular physical activity/exercise, weight management, moderate sodium restriction and increased consumption of fresh fruit, vegetables, and low fat dairy, alcohol moderation, and smoking cessation.;Monitor prescription use compliance.    Expected Outcomes Short Term: Continued assessment and intervention until BP is < 140/9058mG in hypertensive participants. < 130/36m18m in hypertensive participants with diabetes, heart failure or chronic kidney disease.;Long Term: Maintenance of blood pressure at goal levels.    Lipids Yes    Intervention Provide education and support for participant on nutrition & aerobic/resistive exercise along with prescribed medications to achieve LDL <70mg48mL >40mg.56mExpected Outcomes Short Term: Participant states understanding of desired cholesterol values  and is  compliant with medications prescribed. Participant is following exercise prescription and nutrition guidelines.;Long Term: Cholesterol controlled with medications as prescribed, with individualized exercise RX and with personalized nutrition plan. Value goals: LDL < 76m, HDL > 40 mg.    Stress Yes    Intervention Offer individual and/or small group education and counseling on adjustment to heart disease, stress management and health-related lifestyle change. Teach and support self-help strategies.    Expected Outcomes Short Term: Participant demonstrates changes in health-related behavior, relaxation and other stress management skills, ability to obtain effective social support, and compliance with psychotropic medications if prescribed.;Long Term: Emotional wellbeing is indicated by absence of clinically significant psychosocial distress or social isolation.             Core Components/Risk Factors/Patient Goals Review:   Goals and Risk Factor Review     Row Name 12/05/21 0746             Core Components/Risk Factors/Patient Goals Review   Personal Goals Review Weight Management/Obesity;Diabetes;Heart Failure;Hypertension;Lipids;Stress       Review GDamanteis off to a good start and has completed 7 exercise session in traditional cardiac rehab.  GManolowho is deconditioned from chronic nature of his disease process and medical complicated recovery is slowly making progress.  GRaindid complete neurological  follow up and was assured . GKatherinehas gained 2 kg although GReilydesired to maintain his weight his BMI rflects normal weight. He has noticed an increase in his appetite now that he is exercising.  GOrvais on optimal heart failure regimen and is compliant and attends follow up appts in the heart failure clinic.  Vital signs and blood glucose readings are within normal limits.  GLovelaceattends the educational classes regarding stress as able and feels the relaxation techniques are  beneficial.       Expected Outcomes GBosswill adopt a heart healthy lifestyle in accordance to the tree pillars learned from the [facility] cardiac rehab: hear healthy nutrition and healthy mindset.                Core Components/Risk Factors/Patient Goals at Discharge (Final Review):   Goals and Risk Factor Review - 12/05/21 0746       Core Components/Risk Factors/Patient Goals Review   Personal Goals Review Weight Management/Obesity;Diabetes;Heart Failure;Hypertension;Lipids;Stress    Review GManningis off to a good start and has completed 7 exercise session in traditional cardiac rehab.  GDaichiwho is deconditioned from chronic nature of his disease process and medical complicated recovery is slowly making progress.  GJeanmarcdid complete neurological  follow up and was assured . GXainehas gained 2 kg although GDimitridesired to maintain his weight his BMI rflects normal weight. He has noticed an increase in his appetite now that he is exercising.  GFontaineis on optimal heart failure regimen and is compliant and attends follow up appts in the heart failure clinic.  Vital signs and blood glucose readings are within normal limits.  GAudyattends the educational classes regarding stress as able and feels the relaxation techniques are beneficial.    Expected Outcomes GDarenwill adopt a heart healthy lifestyle in accordance to the tree pillars learned from the [facility] cardiac rehab: hear healthy nutrition and healthy mindset.             ITP Comments:  ITP Comments     Row Name 11/13/21 0914 12/05/21 0844         ITP Comments Dr. TFransico Him  Medical Director, Introduction to [facility] education program / intensive cardiac rehab, intial [facility] orientation packet reviewed with patient. 30 day ITP Review. Dagen has good attendance and participation in Intensive Cardiac Rehab. He is offf to a wonderful start.               Comments: Pt is making expected progress toward personal  goals after completing 7 sessions. Recommend continued exercise and life style modification education including  stress management and relaxation techniques to decrease cardiac risk profile.  Cherre Huger, BSN Cardiac and Training and development officer

## 2021-12-05 NOTE — Progress Notes (Deleted)
Electrophysiology Office Follow up Visit Note:    Date:  12/05/2021   ID:  Jason Stevenson, DOB 07/04/1985, MRN [id number]  PCP:  Marcine Matar, MD  [facility] Cardiologist:  Charlton Haws, MD  [facility] Electrophysiologist:  Lanier Prude, MD    Interval History:    Jason Stevenson is a 36 y.o. male who presents for a follow up visit.  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  He is being worked up for cardiac transplantation at [facility].  Remote interrogations since implant have shown stable device function without high-voltage therapies.       Past Medical History:  Diagnosis Date   Chronic systolic CHF (congestive heart failure) (HCC) 06/18/2015   Diabetes mellitus without complication (HCC)    Hemorrhagic stroke (HCC)    Hyperlipidemia    Hypertension    Nonischemic cardiomyopathy (HCC) Noted as early as 2007   Per chart review (cards consult note 2011), EF of 40% in 2007, down to 20-25% in 2011    Past Surgical History:  Procedure Laterality Date   ICD IMPLANT N/A 08/27/2021   Procedure: ICD IMPLANT;  Surgeon: Lanier Prude, MD;  Location: [facility] INVASIVE CV LAB;  Service: Cardiovascular;  Laterality: N/A;   None     RIGHT HEART CATH  06/21/2021   [facility]   RIGHT/LEFT HEART CATH AND CORONARY ANGIOGRAPHY N/A 11/02/2020   Procedure: RIGHT/LEFT HEART CATH AND CORONARY ANGIOGRAPHY;  Surgeon: Laurey Morale, MD;  Location: [facility] INVASIVE CV LAB;  Service: Cardiovascular;  Laterality: N/A;    Current Medications: No outpatient medications have been marked as taking for the 12/05/21 encounter (Appointment) with Lanier Prude, MD.     Allergies:   Patient has no known allergies.   Social History   Socioeconomic History   Marital status: Married    Spouse name: [name]   Number of children: 0   Years of education: 13   Highest education level: Some college, no degree  Occupational History    Occupation: unemployed  Tobacco Use   Smoking status: Never   Smokeless tobacco: Never  Vaping Use   Vaping Use: Not on file  Substance and Sexual Activity   Alcohol use: Not Currently    Comment: "occasional" when "hanging out with the wrong people" No recent use.   Drug use: Not Currently    Types: Marijuana    Comment: occasional, last 2013   Sexual activity: Not Currently  Other Topics Concern   Not on file  Social History Narrative   Pt lives at home with his wife    Right handed    Caffeine- hardly any   Social Determinants of Health   Financial Resource Strain: High Risk (10/18/2021)   Overall Financial Resource Strain (CARDIA)    Difficulty of Paying Living Expenses: Hard  Food Insecurity: No Food Insecurity (09/14/2020)   Hunger Vital Sign    Worried About Running Out of Food in the Last Year: Never true    Ran Out of Food in the Last Year: Never true  Recent Concern: Food Insecurity - Food Insecurity Present (09/13/2020)   Hunger Vital Sign    Worried About Running Out of Food in the Last Year: Never true    Ran Out of Food in the Last Year: Sometimes true  Transportation Needs: Unmet Transportation Needs (12/15/2020)   PRAPARE - Transportation    Lack of Transportation (Medical): No    Lack of  Transportation (Non-Medical): Yes  Physical Activity: Not on file  Stress: Not on file  Social Connections: Not on file     Family History: The patient's family history includes Diabetes in his mother; Hypertension in his mother; Stroke in his maternal aunt and mother. There is no history of Heart attack.  ROS:   Please see the history of present illness.    All other systems reviewed and are negative.  EKGs/Labs/Other Studies Reviewed:    The following studies were reviewed today:  December 05, 2021 in clinic device interrogation personally reviewed ***   EKG:  The ekg ordered today demonstrates ***  Recent Labs: 11/21/2021: ALT 59; B Natriuretic Peptide 36.8; BUN 9;  Creatinine, Ser 0.84; Hemoglobin 14.5; Platelets 217; Potassium 4.5; Sodium 141; TSH 0.904  Recent Lipid Panel    Component Value Date/Time   CHOL 133 09/21/2021 1146   CHOL 107 06/08/2021 0851   TRIG 149 09/21/2021 1146   HDL 47 09/21/2021 1146   HDL 49 06/08/2021 0851   CHOLHDL 2.8 09/21/2021 1146   VLDL 30 09/21/2021 1146   LDLCALC 56 09/21/2021 1146   LDLCALC 31 06/08/2021 0851    Physical Exam:    VS:  There were no vitals taken for this visit.    Wt Readings from Last 3 Encounters:  11/28/21 163 lb 12.8 oz (74.3 kg)  11/22/21 160 lb 3.2 oz (72.7 kg)  11/15/21 161 lb 9.6 oz (73.3 kg)     GEN: *** Well nourished, well developed in no acute distress HEENT: Normal NECK: No JVD; No carotid bruits LYMPHATICS: No lymphadenopathy CARDIAC: ***RRR, no murmurs, rubs, gallops RESPIRATORY:  Clear to auscultation without rales, wheezing or rhonchi  ABDOMEN: Soft, non-tender, non-distended MUSCULOSKELETAL:  No edema; No deformity  SKIN: Warm and dry NEUROLOGIC:  Alert and oriented x 3 PSYCHIATRIC:  Normal affect        ASSESSMENT:    1. Chronic systolic CHF (congestive heart failure) (HCC)   2. Non-ischemic cardiomyopathy (HCC)   3. ICD (implantable cardioverter-defibrillator) in place    PLAN:    In order of problems listed above:  #Chronic systolic heart failure #Nonischemic cardiomyopathy #ICD in situ         Total time spent with patient today *** minutes. This includes reviewing records, evaluating the patient and coordinating care.   Medication Adjustments/Labs and Tests Ordered: Current medicines are reviewed at length with the patient today.  Concerns regarding medicines are outlined above.  No orders of the defined types were placed in this encounter.  No orders of the defined types were placed in this encounter.    Signed, Steffanie Dunn, MD, [facility], [facility] 12/05/2021 5:47 AM    Electrophysiology [facility]

## 2021-12-05 NOTE — Progress Notes (Signed)
Electrophysiology Office Follow up Visit Note:    Date:  12/05/2021   ID:  Jason Stevenson, DOB December 22, 1985, MRN [id number]  PCP:  Jason Pier, MD  [facility] Cardiologist:  Jason Rouge, MD  [facility] Electrophysiologist:  Jason Epley, MD    Interval History:    Jason Stevenson is a 36 y.o. male who presents for a follow up visit.  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  He is being worked up for cardiac transplantation at [facility].  Remote interrogations since implant have shown stable device function without high-voltage therapies.  Today:  He has been feeling better since his work-up at [facility]. His energy level is low. He has fatigue and shortness of breath.   His incision stings, but doesn't prevent movement during his rehab. He is 3 weeks into an 8 week rehab program.  He is not yet listen for a transplant, but he will follow up once he finishes his rehab program.   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 He sleeps better at night now that these symptoms are resolved.  He has had 2 Hep B shots, and he needs a third.  He denies any palpitations, chest pain, or peripheral edema. No lightheadedness, headaches, syncope, orthopnea, or PND.   Past Medical History:  Diagnosis Date   Chronic systolic CHF (congestive heart failure) (Jason Stevenson) 06/18/2015   Diabetes mellitus without complication ([place])    Hemorrhagic stroke (Jason Stevenson)    Hyperlipidemia    Hypertension    Nonischemic cardiomyopathy (Jason Stevenson) Noted as early as 2007   Per chart review (cards consult note 2011), EF of 40% in 2007, down to 20-25% in 2011    Past Surgical History:  Procedure Laterality Date   ICD IMPLANT N/A 08/27/2021   Procedure: ICD IMPLANT;  Surgeon: Jason Epley, MD;  Location: [place] [facility];  Service: Cardiovascular;  Laterality: N/A;   None     RIGHT HEART CATH  06/21/2021   [facility]    RIGHT/LEFT HEART CATH AND CORONARY ANGIOGRAPHY N/A 11/02/2020   Procedure: RIGHT/LEFT HEART CATH AND CORONARY ANGIOGRAPHY;  Surgeon: Jason Dresser, MD;  Location: [place] [facility];  Service: Cardiovascular;  Laterality: N/A;    Current Medications: Current Meds  Medication Sig   Accu-Chek Softclix Lancets lancets Use to check blood sugar three times daily.   acetaminophen (TYLENOL) 325 MG tablet Take 1-2 tablets (325-650 mg total) by mouth every 4 (four) hours as needed for mild pain.   atorvastatin (LIPITOR) 80 MG tablet TAKE 1 TABLET (80 MG TOTAL) BY MOUTH DAILY (EVENING)   Blood Glucose Monitoring Suppl (ACCU-CHEK GUIDE) w/Device KIT Use to check blood sugar three times daily.   Blood Pressure Monitor DEVI Use as directed to check home blood pressure 2-3 times a week   Continuous Blood Gluc Receiver (FREESTYLE LIBRE 2 READER) DEVI 1 each by Does not apply route daily.   Continuous Blood Gluc Sensor (FREESTYLE LIBRE 2 SENSOR) MISC USE EVERY 14 (FOURTEEN) DAYS.   dapagliflozin propanediol (FARXIGA) 5 MG TABS tablet Take 1 tablet (5 mg total) by mouth daily before breakfast.   digoxin (LANOXIN) 0.125 MG tablet Take 1 tablet (0.125 mg total) by mouth daily. Please request Cardiologist for future refills.   DULoxetine (CYMBALTA) 30 MG capsule TAKE 1 CAPSULE (30 MG TOTAL) BY MOUTH DAILY.   Evolocumab (REPATHA SURECLICK) 299 MG/ML SOAJ Inject 140 mg  into the skin every 14 (fourteen) days.   ezetimibe (ZETIA) 10 MG tablet TAKE 1 TABLET (10 MG TOTAL) BY MOUTH DAILY.   furosemide (LASIX) 20 MG tablet Take 1 tablet (20 mg total) by mouth 3 (three) times a week. Mondays Wednesdays and Friday   glucose blood (ACCU-CHEK GUIDE) test strip Use to check blood sugar three times daily.   insulin aspart (FIASP FLEXTOUCH) 100 UNIT/ML FlexTouch Pen Inject 5-8 Units into the skin 3 (three) times daily before meals.   insulin degludec (TRESIBA FLEXTOUCH) 200 UNIT/ML FlexTouch Pen Inject 20 Units into the  skin daily.   Insulin Pen Needle 32G X 4 MM MISC Use to inject insulin.   Insulin Syringe-Needle U-100 (RELION INSULIN SYRINGE) 31G X 15/64" 0.3 ML MISC Use to inject insulin daily.   ivabradine (CORLANOR) 5 MG TABS tablet Take 1 tablet (5 mg total) by mouth 2 (two) times daily with a meal.   levETIRAcetam (KEPPRA) 750 MG tablet Take 2 tablets (1,500 mg total) by mouth 2 (two) times daily.   Menthol, Topical Analgesic, (ICY HOT BACK EX) Apply 1 application topically as needed (neuropathy). Icy hot cream   metFORMIN (GLUCOPHAGE-XR) 500 MG 24 hr tablet Take 1 tablet (500 mg total) by mouth 2 (two) times daily with a meal.   metoprolol (TOPROL-XL) 200 MG 24 hr tablet TAKE 0.5 TABLETS (100 MG TOTAL) BY MOUTH DAILY. TAKE WITH OR IMMEDIATELY FOLLOWING A MEAL (EVENING)   Oxcarbazepine (TRILEPTAL) 300 MG tablet Take 2 tablets (600 mg total) by mouth 2 (two) times daily.   phenytoin (DILANTIN) 100 MG ER capsule Take 1 capsule (100 mg total) by mouth 3 (three) times daily.   potassium chloride SA (KLOR-CON M) 20 MEQ tablet Take 1 tablet (20 mEq total) by mouth 3 (three) times a week. Take every Mon, Wed and Fri with Lasix   pregabalin (LYRICA) 300 MG capsule TAKE 1 CAPSULE (300 MG TOTAL) BY MOUTH AT BEDTIME.   sacubitril-valsartan (ENTRESTO) 49-51 MG Take 1 tablet by mouth 2 (two) times daily.   spironolactone (ALDACTONE) 25 MG tablet Take 1 tablet (25 mg total) by mouth at bedtime.   warfarin (COUMADIN) 5 MG tablet Take 1-1.5 tablets (5-7.5 mg total) by mouth daily as directed by Coumadin clinic     Allergies:   Patient has no known allergies.   Social History   Socioeconomic History   Marital status: Married    Spouse name: Jason Stevenson   Number of children: 0   Years of education: 13   Highest education level: Some college, no degree  Occupational History   Occupation: unemployed  Tobacco Use   Smoking status: Never   Smokeless tobacco: Never  Vaping Use   Vaping Use: Not on file  Substance and  Sexual Activity   Alcohol use: Not Currently    Comment: "occasional" when "hanging out with the wrong people" No recent use.   Drug use: Not Currently    Types: Marijuana    Comment: occasional, last 2013   Sexual activity: Not Currently  Other Topics Concern   Not on file  Social History Narrative   Pt lives at home with his wife    Right handed    Caffeine- hardly any   Social Determinants of Health   Financial Resource Strain: High Risk (10/18/2021)   Overall Financial Resource Strain (CARDIA)    Difficulty of Paying Living Expenses: Hard  Food Insecurity: No Food Insecurity (09/14/2020)   Hunger Vital Sign    Worried About  Running Out of Food in the Last Year: Never true    Ran Out of Food in the Last Year: Never true  Recent Concern: Food Insecurity - Food Insecurity Present (09/13/2020)   Hunger Vital Sign    Worried About Running Out of Food in the Last Year: Never true    Ran Out of Food in the Last Year: Sometimes true  Transportation Needs: Unmet Transportation Needs (12/15/2020)   PRAPARE - Hydrologist (Medical): No    Lack of Transportation (Non-Medical): Yes  Physical Activity: Not on file  Stress: Not on file  Social Connections: Not on file     Family History: The patient's family history includes Diabetes in his mother; Hypertension in his mother; Stroke in his maternal aunt and mother. There is no history of Heart attack.  ROS:   Please see the history of present illness.    (+)Fatigue (+)Shortness of breath (+)Pain at Incision  (+)Neuropathy in extremities   All other systems reviewed and are negative.  EKGs/Labs/Other Studies Reviewed:    The following studies were reviewed today:  December 05, 2021 in clinic device interrogation personally reviewed Battery longevity 11.1 years Lead parameters stable V sensed 71 as presenting rhythm V pacing less than 0.1% No high-voltage therapies 12 episodes of nonsustained VT all  clustered on November 10, 2021 around 4:30 PM.  Average ventricular rates around 200 to 220 bpm during these episodes.  Review of EGM suggest NSVT versus possible atrial tachycardia with ectopy.  No high-voltage therapies required.    ICD Implant 08/27/21:  CONCLUSIONS:   1. Non-Ischemic cardiomyopathy with chronic New York Heart Association class III heart failure.   2. Successful ICD implantation with a Medtronic single coil VVI ICD implanted for primary prevention of sudden death.   3.  No early apparent complications.   4. OK to restart Coumadin in 5 days  Echo 01/29/21:  IMPRESSIONS    1. Left ventricular ejection fraction, by estimation, is <20%. The left  ventricle has severely decreased function. The left ventricle demonstrates  global hypokinesis. The left ventricular internal cavity size was mildly  dilated. Left ventricular  diastolic parameters are consistent with Grade I diastolic dysfunction  (impaired relaxation). The average left ventricular global longitudinal  strain is -4.4 %. The global longitudinal strain is abnormal. No LV  thrombus noted.   2. Right ventricular systolic function is mildly reduced. The right  ventricular size is normal. Tricuspid regurgitation signal is inadequate  for assessing PA pressure.   3. The mitral valve is normal in structure. No evidence of mitral valve  regurgitation. No evidence of mitral stenosis.   4. The aortic valve is tricuspid. Aortic valve regurgitation is not  visualized. No aortic stenosis is present.   5. The inferior vena cava is normal in size with greater than 50%  respiratory variability, suggesting right atrial pressure of 3 mmHg.   Cardiopulmonary Exercise Test 01/02/21:  Conclusion: Exercise testing with gas exchange demonstrates severely reduced functional capacity when compared to matched sedentary norms. Several variables suggest a cardiopulmonary limitation, however the VE/VCO2 slope is normal. Patient is  significantly deconditioned and more likely has a larger HF limiting component than is suggested.   Left/Right Heart Cath 11/02/20:  1. Normal filling pressures.  2. Preserved cardiac output.  3. No significant CAD.    Non-cardiac chest pain.  Well-compensated CHF.   Dominance: Right    Echo 09/13/20:  IMPRESSIONS    1. Left ventricular  apical thrombus, 0.9 x 0.9 cm.   2. Left ventricular ejection fraction, by estimation, is 10-15%. The left  ventricle has severely decreased function. The left ventricle demonstrates  global hypokinesis. The left ventricular internal cavity size was mildly  dilated. Left ventricular  diastolic parameters are indeterminate.   3. Right ventricular systolic function is moderately reduced. The right  ventricular size is normal. Tricuspid regurgitation signal is inadequate  for assessing PA pressure.   4. Left atrial size was mildly dilated.   5. The mitral valve is grossly normal. Mild mitral valve regurgitation.  No evidence of mitral stenosis.   6. The aortic valve is tricuspid. Aortic valve regurgitation is not  visualized. No aortic stenosis is present.   7. The inferior vena cava is dilated in size with <50% respiratory  variability, suggesting right atrial pressure of 15 mmHg.  Echo 10/19/18:   IMPRESSIONS     1. The left ventricle has a visually estimated ejection fraction of  15-20%. The cavity size was mildly dilated. Left ventricular diastolic  Doppler parameters are indeterminate. Left ventricular diffuse  hypokinesis.   2. The right ventricle has normal systolic function. The cavity was  mildly enlarged. There is no increase in right ventricular wall thickness.  Right ventricular systolic pressure could not be assessed.   3. The aortic valve is tricuspid. Aortic valve regurgitation is trivial  by color flow Doppler.   4. The inferior vena cava was normal in size with <50% respiratory  variability.   5. When compared to the prior  study: 08/21/2015 - no significant change in  biventricular function. Side by side comparison performed.    EKG:  No EKG ordered.  Recent Labs: 11/21/2021: ALT 59; B Natriuretic Peptide 36.8; BUN 9; Creatinine, Ser 0.84; Hemoglobin 14.5; Platelets 217; Potassium 4.5; Sodium 141; TSH 0.904  Recent Lipid Panel    Component Value Date/Time   CHOL 133 09/21/2021 1146   CHOL 107 06/08/2021 0851   TRIG 149 09/21/2021 1146   HDL 47 09/21/2021 1146   HDL 49 06/08/2021 0851   CHOLHDL 2.8 09/21/2021 1146   VLDL 30 09/21/2021 1146   LDLCALC 56 09/21/2021 1146   LDLCALC 31 06/08/2021 0851    Physical Exam:    VS:  BP 96/62   Pulse 72   Ht 6' (1.829 m)   Wt 162 lb 3.2 oz (73.6 kg)   SpO2 97%   BMI 22.00 kg/m     Wt Readings from Last 3 Encounters:  12/05/21 162 lb 3.2 oz (73.6 kg)  11/28/21 163 lb 12.8 oz (74.3 kg)  11/22/21 160 lb 3.2 oz (72.7 kg)     GEN:  Well nourished, well developed in no acute distress HEENT: Normal NECK: No JVD; No carotid bruits LYMPHATICS: No lymphadenopathy CARDIAC: RRR, no murmurs, rubs, gallops.  ICD pocket well-healed RESPIRATORY:  Clear to auscultation without rales, wheezing or rhonchi  ABDOMEN: Soft, non-tender, non-distended MUSCULOSKELETAL:  No edema; No deformity  SKIN: Warm and dry NEUROLOGIC:  Alert and oriented x 3 PSYCHIATRIC:  Normal affect        ASSESSMENT:    1. Chronic systolic CHF (congestive heart failure) ([place])   2. Non-ischemic cardiomyopathy ([place])   3. ICD (implantable cardioverter-defibrillator) in place    PLAN:    In order of problems listed above:  #Chronic systolic heart failure #Nonischemic cardiomyopathy #ICD in situ  NYHA class III.  Follows with the heart failure clinic.  Being evaluated for possible cardiac transplantation at [facility].  Continue cardiac rehab.  Continue current medical therapy.  ICD functioning appropriately.     Total time spent with patient today 30 minutes. This includes reviewing  records, evaluating the patient and coordinating care.   Medication Adjustments/Labs and Tests Ordered: Current medicines are reviewed at length with the patient today.  Concerns regarding medicines are outlined above.  Orders Placed This Encounter  Procedures   [facility] [place]   No orders of the defined types were placed in this encounter.   I,Mary Mosetta Pigeon Buren,acting as a scribe for Jason Epley, MD.,have documented all relevant documentation on the behalf of Jason Epley, MD,as directed by  Jason Epley, MD while in the presence of Jason Epley, MD.   I, Jason Epley, MD, have reviewed all documentation for this visit. The documentation on 12/05/21 for the exam, diagnosis, procedures, and orders are all accurate and complete.    Signed, Lars Mage, MD, [facility], [facility] 12/05/2021 8:53 PM    Electrophysiology [facility]

## 2021-12-07 ENCOUNTER — Encounter (HOSPITAL_COMMUNITY)
Admission: RE | Admit: 2021-12-07 | Discharge: 2021-12-07 | Disposition: A | Discharge status: Home / Self Care | Payer: Medicaid Other | Source: Ambulatory Visit | Attending: Cardiology | Admitting: Cardiology

## 2021-12-07 DIAGNOSIS — I5022 Chronic systolic (congestive) heart failure: Secondary | ICD-10-CM

## 2021-12-10 ENCOUNTER — Ambulatory Visit: Payer: Medicaid Other | Attending: Cardiology

## 2021-12-10 ENCOUNTER — Encounter (HOSPITAL_COMMUNITY)
Admission: RE | Admit: 2021-12-10 | Discharge: 2021-12-10 | Disposition: A | Discharge status: Home / Self Care | Payer: Medicaid Other | Source: Ambulatory Visit | Attending: Cardiology | Admitting: Cardiology

## 2021-12-10 DIAGNOSIS — I5022 Chronic systolic (congestive) heart failure: Secondary | ICD-10-CM | POA: Diagnosis not present

## 2021-12-10 DIAGNOSIS — I513 Intracardiac thrombosis, not elsewhere classified: Secondary | ICD-10-CM | POA: Insufficient documentation

## 2021-12-10 DIAGNOSIS — I639 Cerebral infarction, unspecified: Secondary | ICD-10-CM | POA: Diagnosis not present

## 2021-12-10 DIAGNOSIS — Z7901 Long term (current) use of anticoagulants: Secondary | ICD-10-CM | POA: Insufficient documentation

## 2021-12-10 DIAGNOSIS — Z5181 Encounter for therapeutic drug level monitoring: Secondary | ICD-10-CM | POA: Insufficient documentation

## 2021-12-10 LAB — POCT INR: INR: 2.6 (ref 2.0–3.0)

## 2021-12-10 NOTE — Patient Instructions (Signed)
continue taking Warfarin 1.5 tablets daily except for 1 tablet on Mondays, Wednesday and Fridays. Stay consistent with leafy veggies. Recheck INR in 6 weeks. Coumadin Clinic [phone] or [phone]

## 2021-12-11 ENCOUNTER — Ambulatory Visit: Payer: Medicaid Other | Admitting: Internal Medicine

## 2021-12-12 ENCOUNTER — Encounter (HOSPITAL_COMMUNITY)
Admission: RE | Admit: 2021-12-12 | Discharge: 2021-12-12 | Disposition: A | Discharge status: Home / Self Care | Payer: Medicaid Other | Source: Ambulatory Visit | Attending: Cardiology | Admitting: Cardiology

## 2021-12-12 DIAGNOSIS — I5022 Chronic systolic (congestive) heart failure: Secondary | ICD-10-CM

## 2021-12-13 ENCOUNTER — Telehealth: Payer: Self-pay | Admitting: Internal Medicine

## 2021-12-13 ENCOUNTER — Other Ambulatory Visit: Payer: Self-pay | Admitting: Internal Medicine

## 2021-12-13 ENCOUNTER — Other Ambulatory Visit (HOSPITAL_COMMUNITY): Payer: Self-pay

## 2021-12-13 NOTE — Progress Notes (Signed)
Paramedicine Encounter    Patient ID: Jason Stevenson, male    DOB: 14-Nov-1985, 36 y.o.   MRN: [id number]  Arrived for home visit for Jason Stevenson who reports to be feeling good. He told me he has been enjoying cardiac rehab and it has brought him a source of community and confidence. He says he feels like it is really helping him regain his strength. Seeing him today his speech seems quicker and he is moving about quicker. He says he enjoys starting his day completing cardiac rehab and is grateful for the experience.   He denied any chest pain, dizziness but says sometimes he gets short of breath. He reports his blood sugars ranging from 70-250. He has been compliant with his medications using his bubble packs as well as a small pill box for his lasix, potassium and warfarin. He was recently seen at the coumadin clinic and INR was within range.   I obtained vitals today as noted:  WT- 162lbs (last reported) BP- 110/60 HR- 57 O2- 98% RR- 16 CBG- 111 (fasting)  No lower leg swelling, lung sounds clear.   I reviewed his medications and verified his bubble packs.   Jason Stevenson reports his sister just passed away suddenly on 06/19/2022 and he stated that he is struggling with this and missed his appointment with Dr. Wynetta Emery because of this. I will assist him in getting a new one set up.   Jason Stevenson also requesting a new neurologist- I will look into this for him.   We reviewed appointments and confirmed same.    I planned to see Jason Stevenson in two weeks, he agreed with same.   [name], [place] 12/13/2021    Patient Care Team: Ladell Pier, MD as PCP - General (Internal Medicine) Josue Hector, MD as PCP - Cardiology (Cardiology) Vickie Epley, MD as PCP - Electrophysiology (Cardiology)  Patient Active Problem List   Diagnosis Date Noted   Nonischemic cardiomyopathy [facility]) 08/27/2021   LADA (latent autoimmune diabetes in adults), managed as type 2 ([place])  03/28/2021   Poorly controlled type 2 diabetes mellitus with circulatory disorder ([place]) 03/19/2021   Ischemic cardiomyopathy    Syncope 12/14/2020   Chest pain 10/30/2020   History of cerebrovascular accident (CVA) with residual deficit 10/27/2020   Protein-calorie malnutrition, severe 09/22/2020   Acute CVA (cerebrovascular accident) ([place]) 09/21/2020   Near syncope 09/21/2020   Encounter for monitoring Coumadin therapy 09/20/2020   LV (left ventricular) mural thrombus 09/13/2020   Seizure ([place]) 09/13/2020   Neurological deficit present 09/12/2020   History of COVID-19 09/12/2020   Intracerebral hemorrhage 09/12/2020   Acute cerebrovascular accident (CVA) ([place]) 09/12/2020   COVID-19 08/23/2020   Noncompliance with medication treatment due to intermittent use of medication 10/12/2018   Dyslipidemia 95/63/8756   Chronic systolic CHF (congestive heart failure) ([place]) 06/18/2015   Needs flu shot 12/23/2013   Non-ischemic cardiomyopathy ([place]) 11/06/2012   HTN (hypertension) 11/06/2012   HLD (hyperlipidemia) 11/06/2012    Current Outpatient Medications:    Accu-Chek Softclix Lancets lancets, Use to check blood sugar three times daily., Disp: 100 each, Rfl: 3   acetaminophen (TYLENOL) 325 MG tablet, Take 1-2 tablets (325-650 mg total) by mouth every 4 (four) hours as needed for mild pain., Disp: , Rfl:    atorvastatin (LIPITOR) 80 MG tablet, TAKE 1 TABLET (80 MG TOTAL) BY MOUTH DAILY (EVENING), Disp: 90 tablet, Rfl: 0   Blood Glucose Monitoring Suppl (ACCU-CHEK GUIDE) w/Device KIT, Use to check blood sugar three  times daily., Disp: 1 kit, Rfl: 0   Blood Pressure Monitor DEVI, Use as directed to check home blood pressure 2-3 times a week, Disp: 1 Device, Rfl: 0   Continuous Blood Gluc Receiver (FREESTYLE LIBRE 2 READER) DEVI, 1 each by Does not apply route daily., Disp: 1 each, Rfl: 0   Continuous Blood Gluc Sensor (FREESTYLE LIBRE 2 SENSOR) MISC, USE EVERY 14 (FOURTEEN) DAYS., Disp: 2 each,  Rfl: 6   dapagliflozin propanediol (FARXIGA) 5 MG TABS tablet, Take 1 tablet (5 mg total) by mouth daily before breakfast., Disp: 30 tablet, Rfl: 6   digoxin (LANOXIN) 0.125 MG tablet, Take 1 tablet (0.125 mg total) by mouth daily. Please request Cardiologist for future refills., Disp: 30 tablet, Rfl: 0   DULoxetine (CYMBALTA) 30 MG capsule, TAKE 1 CAPSULE (30 MG TOTAL) BY MOUTH DAILY., Disp: 30 capsule, Rfl: 2   Evolocumab (REPATHA SURECLICK) 115 MG/ML SOAJ, Inject 140 mg into the skin every 14 (fourteen) days., Disp: 2 mL, Rfl: 11   ezetimibe (ZETIA) 10 MG tablet, TAKE 1 TABLET (10 MG TOTAL) BY MOUTH DAILY., Disp: 30 tablet, Rfl: 0   furosemide (LASIX) 20 MG tablet, Take 1 tablet (20 mg total) by mouth 3 (three) times a week. Mondays Wednesdays and Friday, Disp: 30 tablet, Rfl: 11   glucose blood (ACCU-CHEK GUIDE) test strip, Use to check blood sugar three times daily., Disp: 100 each, Rfl: 2   insulin aspart (FIASP FLEXTOUCH) 100 UNIT/ML FlexTouch Pen, Inject 5-8 Units into the skin 3 (three) times daily before meals., Disp: 30 mL, Rfl: 3   insulin degludec (TRESIBA FLEXTOUCH) 200 UNIT/ML FlexTouch Pen, Inject 20 Units into the skin daily., Disp: 30 mL, Rfl: 3   Insulin Pen Needle 32G X 4 MM MISC, Use to inject insulin., Disp: 200 each, Rfl: 2   Insulin Syringe-Needle U-100 (RELION INSULIN SYRINGE) 31G X 15/64" 0.3 ML MISC, Use to inject insulin daily., Disp: 100 each, Rfl: 11   ivabradine (CORLANOR) 5 MG TABS tablet, Take 1 tablet (5 mg total) by mouth 2 (two) times daily with a meal., Disp: 60 tablet, Rfl: 6   levETIRAcetam (KEPPRA) 750 MG tablet, Take 2 tablets (1,500 mg total) by mouth 2 (two) times daily., Disp: 360 tablet, Rfl: 4   Menthol, Topical Analgesic, (ICY HOT BACK EX), Apply 1 application topically as needed (neuropathy). Icy hot cream, Disp: , Rfl:    metFORMIN (GLUCOPHAGE-XR) 500 MG 24 hr tablet, Take 1 tablet (500 mg total) by mouth 2 (two) times daily with a meal., Disp: 180  tablet, Rfl: 3   metoprolol (TOPROL-XL) 200 MG 24 hr tablet, TAKE 0.5 TABLETS (100 MG TOTAL) BY MOUTH DAILY. TAKE WITH OR IMMEDIATELY FOLLOWING A MEAL (EVENING), Disp: 30 tablet, Rfl: 0   Oxcarbazepine (TRILEPTAL) 300 MG tablet, Take 2 tablets (600 mg total) by mouth 2 (two) times daily., Disp: 360 tablet, Rfl: 4   phenytoin (DILANTIN) 100 MG ER capsule, Take 1 capsule (100 mg total) by mouth 3 (three) times daily., Disp: 270 capsule, Rfl: 4   potassium chloride SA (KLOR-CON M) 20 MEQ tablet, Take 1 tablet (20 mEq total) by mouth 3 (three) times a week. Take every Mon, Wed and Fri with Lasix, Disp: 30 tablet, Rfl: 4   pregabalin (LYRICA) 300 MG capsule, TAKE 1 CAPSULE (300 MG TOTAL) BY MOUTH AT BEDTIME., Disp: 30 capsule, Rfl: 2   sacubitril-valsartan (ENTRESTO) 49-51 MG, Take 1 tablet by mouth 2 (two) times daily., Disp: 60 tablet, Rfl: 3  spironolactone (ALDACTONE) 25 MG tablet, Take 1 tablet (25 mg total) by mouth at bedtime., Disp: 90 tablet, Rfl: 3   warfarin (COUMADIN) 5 MG tablet, Take 1-1.5 tablets (5-7.5 mg total) by mouth daily as directed by Coumadin clinic, Disp: 40 tablet, Rfl: 3 No Known Allergies   Social History   Socioeconomic History   Marital status: Married    Spouse name: [name]   Number of children: 0   Years of education: 13   Highest education level: Some college, no degree  Occupational History   Occupation: unemployed  Tobacco Use   Smoking status: Never   Smokeless tobacco: Never  Vaping Use   Vaping Use: Not on file  Substance and Sexual Activity   Alcohol use: Not Currently    Comment: "occasional" when "hanging out with the wrong people" No recent use.   Drug use: Not Currently    Types: Marijuana    Comment: occasional, last 2013   Sexual activity: Not Currently  Other Topics Concern   Not on file  Social History Narrative   Pt lives at home with his wife    Right handed    Caffeine- hardly any   Social Determinants of Health   Financial  Resource Strain: High Risk (10/18/2021)   Overall Financial Resource Strain (CARDIA)    Difficulty of Paying Living Expenses: Hard  Food Insecurity: No Food Insecurity (09/14/2020)   Hunger Vital Sign    Worried About Running Out of Food in the Last Year: Never true    Ran Out of Food in the Last Year: Never true  Recent Concern: Food Insecurity - Food Insecurity Present (09/13/2020)   Hunger Vital Sign    Worried About Running Out of Food in the Last Year: Never true    Ran Out of Food in the Last Year: Sometimes true  Transportation Needs: Unmet Transportation Needs (12/15/2020)   PRAPARE - Hydrologist (Medical): No    Lack of Transportation (Non-Medical): Yes  Physical Activity: Not on file  Stress: Not on file  Social Connections: Not on file  Intimate Partner Violence: Not on file    Physical Exam      Future Appointments  Date Time Provider [place]  12/14/2021  6:45 AM [facility] PHASE II EXC [facility]-RE[facility] None  12/19/2021  6:45 AM MC-[facility] PHASE II EXC MC-REHSC None  12/21/2021  6:45 AM MC-CRE[facility] PHASE II EXC MC-REHSC None  12/24/2021  6:45 AM MC-CREHA PHASE II EXC MC-REHSC None  12/26/2021  6:45 AM MC-CRE[facility] PHASE II EXC MC-REHSC None  12/28/2021  6:45 AM MC-CRE[facility] PHASE II EXC MC-REHSC None  01/21/2022  9:30 AM [facility] CL[facility]IC [facility] None  02/28/2022  7:00 AM [facility] DEVICE REMOTES [facility]  03/18/2022  8:30 AM LB[facility]-LBENDO LAB [facility] None  03/21/2022  8:40 AM Philemon Kingdom, MD LBPC-LBENDO None  03/22/2022  8:40 AM Philemon Kingdom, MD LBPC-LBENDO None  05/30/2022  7:00 AM [facility] DEVICE REMOTES [facility]  07/01/2022  1:30 PM Penumalli, Earlean Polka, MD GNA-[facility] None  08/29/2022  7:00 AM [facility] DEVICE REMOTES [facility]  11/28/2022  7:00 AM [facility] DEVICE REMOTES [facility]     ACTION: Home visit completed

## 2021-12-14 ENCOUNTER — Other Ambulatory Visit (HOSPITAL_COMMUNITY): Payer: Self-pay | Admitting: *Deleted

## 2021-12-14 ENCOUNTER — Telehealth: Payer: Self-pay | Admitting: Internal Medicine

## 2021-12-14 ENCOUNTER — Encounter (HOSPITAL_COMMUNITY)
Admission: RE | Admit: 2021-12-14 | Discharge: 2021-12-14 | Disposition: A | Discharge status: Home / Self Care | Payer: Medicaid Other | Source: Ambulatory Visit | Attending: Cardiology | Admitting: Cardiology

## 2021-12-14 DIAGNOSIS — I5022 Chronic systolic (congestive) heart failure: Secondary | ICD-10-CM | POA: Insufficient documentation

## 2021-12-14 DIAGNOSIS — Z4889 Encounter for other specified surgical aftercare: Secondary | ICD-10-CM | POA: Insufficient documentation

## 2021-12-14 MED ORDER — METOPROLOL SUCCINATE ER 200 MG PO TB24: ORAL_TABLET | ORAL | 6 refills | Status: DC

## 2021-12-14 MED ORDER — EZETIMIBE 10 MG PO TABS: 10.0000 mg | ORAL_TABLET | Freq: Every day | ORAL | 3 refills | Status: DC

## 2021-12-14 MED ORDER — DIGOXIN 125 MCG PO TABS: 0.1250 mg | ORAL_TABLET | Freq: Every day | ORAL | 6 refills | Status: DC

## 2021-12-14 NOTE — Telephone Encounter (Signed)
Copied from CRM [id number]. Topic: General - Other >> Dec 13, 2021  3:20 PM Ja-Kwan M wrote: Reason for CRM: Pt spouse reports that pt received call from his pharmacy because they will be filling his bubble pack and he was told that authorization is needed for the following medications: digoxin (LANOXIN) 0.125 MG tablet, ezetimibe (ZETIA) 10 MG tablet, and metoprolol (TOPROL-XL) 200 MG 24 hr tablet

## 2021-12-14 NOTE — Progress Notes (Signed)
Incomplete Session Note  Patient Details  Name: Jason Stevenson MRN: [id number] Date of Birth: Mar 01, 1986 Referring Provider:   Flowsheet Row CARDIAC REHAB PHASE II ORIENTATION from 11/13/2021 in [facility] CARDIAC [facility]  Referring Provider Elly Modena MD       [name] did not complete his rehab session.  Elevated CBG pre exercise - 317.  Unable to proceed with exercise per cardiac rehab protocol.  Pt admits that he missed his evening DM medication as he and his wife were celebrating their anniversary.  Encourage to drink water to help lower his Blood glucose.  Wife and patient verbalized understanding. Alanson Aly, BSN Cardiac and Emergency planning/management officer

## 2021-12-15 DIAGNOSIS — H40033 Anatomical narrow angle, bilateral: Secondary | ICD-10-CM | POA: Diagnosis not present

## 2021-12-15 DIAGNOSIS — H5203 Hypermetropia, bilateral: Secondary | ICD-10-CM | POA: Diagnosis not present

## 2021-12-18 ENCOUNTER — Telehealth (HOSPITAL_COMMUNITY): Payer: Self-pay | Admitting: Licensed Clinical Social Worker

## 2021-12-18 NOTE — Telephone Encounter (Addendum)
H&V Care Navigation CSW Progress Note  Clinical Social Worker contacted patient by phone to discuss consult for utility assistance.  CSW informed of utility cost concerns from paramedic and reached out to pt and pt wife to discuss.  They report that they owe a total over over $400 in past due bills.  Has had high rates of water bills for several months making it hard for them to keep up- working with landlord to assess for a leak given high costs but process has been slow.  They currently have shut off notice- CSW able to help with minimum payment through Patient Care Fund to avoid shut off at this time.  SDOH Screenings   Food Insecurity: No Food Insecurity (09/14/2020)  Recent Concern: Food Insecurity - Food Insecurity Present (09/13/2020)  Housing: Low Risk  (12/15/2020)  Transportation Needs: Unmet Transportation Needs (12/15/2020)  Utilities: At Risk (12/18/2021)  Depression (PHQ2-9): Medium Risk (11/13/2021)  Financial Resource Strain: High Risk (10/18/2021)  Tobacco Use: Low Risk  (12/05/2021)    Will continue to follow and assist as needed  Burna Sis, LCSW Clinical Social Worker [facility] Desk#: [phone] Cell#: [phone]

## 2021-12-18 NOTE — Telephone Encounter (Signed)
Domingo Madeira, can you check on these PA's

## 2021-12-19 ENCOUNTER — Encounter (HOSPITAL_COMMUNITY)
Admission: RE | Admit: 2021-12-19 | Discharge: 2021-12-19 | Disposition: A | Discharge status: Home / Self Care | Payer: Medicaid Other | Source: Ambulatory Visit | Attending: Cardiology | Admitting: Cardiology

## 2021-12-19 DIAGNOSIS — Z4889 Encounter for other specified surgical aftercare: Secondary | ICD-10-CM | POA: Diagnosis not present

## 2021-12-19 DIAGNOSIS — I5022 Chronic systolic (congestive) heart failure: Secondary | ICD-10-CM

## 2021-12-19 DIAGNOSIS — H5213 Myopia, bilateral: Secondary | ICD-10-CM | POA: Diagnosis not present

## 2021-12-19 NOTE — Telephone Encounter (Signed)
VM left for patient informing him that his medication were refilled and sent to his pharmacy.

## 2021-12-19 NOTE — Progress Notes (Signed)
CARDIAC REHAB PHASE 2  Reviewed home exercise with pt today. Pt is tolerating exercise well. Pt will continue to exercise on his own by walking for 30 minutes per session 2-3 days a week in addition to the 3 days in CRP2. Pt is interested in joining a local recreation center, but has not yet, encouraged pt to look into it further. Advised pt on THRR, RPE scale, hydration and temperature/humidity precautions. Reinforced S/S to stop exercise and when to call MD vs 911. Encouraged warm up cool down and stretches with exercise sessions. Pt verbalized understanding, all questions were answered and pt was given a copy to take home.    Harrie Jeans ACSM-CEP 12/19/2021 9:20 AM

## 2021-12-21 ENCOUNTER — Encounter (HOSPITAL_COMMUNITY)
Admission: RE | Admit: 2021-12-21 | Discharge: 2021-12-21 | Disposition: A | Discharge status: Home / Self Care | Payer: Medicaid Other | Source: Ambulatory Visit | Attending: Cardiology | Admitting: Cardiology

## 2021-12-21 DIAGNOSIS — I5022 Chronic systolic (congestive) heart failure: Secondary | ICD-10-CM

## 2021-12-24 ENCOUNTER — Encounter (HOSPITAL_COMMUNITY)
Admission: RE | Admit: 2021-12-24 | Discharge: 2021-12-24 | Disposition: A | Discharge status: Home / Self Care | Payer: Medicaid Other | Source: Ambulatory Visit | Attending: Cardiology | Admitting: Cardiology

## 2021-12-24 DIAGNOSIS — I5022 Chronic systolic (congestive) heart failure: Secondary | ICD-10-CM | POA: Diagnosis not present

## 2021-12-26 ENCOUNTER — Encounter (HOSPITAL_COMMUNITY)
Admission: RE | Admit: 2021-12-26 | Discharge: 2021-12-26 | Disposition: A | Discharge status: Home / Self Care | Payer: Medicaid Other | Source: Ambulatory Visit | Attending: Cardiology | Admitting: Cardiology

## 2021-12-26 DIAGNOSIS — I5022 Chronic systolic (congestive) heart failure: Secondary | ICD-10-CM | POA: Diagnosis not present

## 2021-12-27 NOTE — Progress Notes (Signed)
Remote ICD transmission.   

## 2021-12-28 ENCOUNTER — Encounter (HOSPITAL_COMMUNITY)
Admission: RE | Admit: 2021-12-28 | Discharge: 2021-12-28 | Disposition: A | Discharge status: Home / Self Care | Payer: Medicaid Other | Source: Ambulatory Visit | Attending: Cardiology | Admitting: Cardiology

## 2021-12-28 DIAGNOSIS — I5022 Chronic systolic (congestive) heart failure: Secondary | ICD-10-CM

## 2021-12-31 ENCOUNTER — Encounter (HOSPITAL_COMMUNITY): Payer: Medicaid Other

## 2021-12-31 ENCOUNTER — Encounter (HOSPITAL_COMMUNITY)
Admission: RE | Admit: 2021-12-31 | Discharge: 2021-12-31 | Disposition: A | Discharge status: Home / Self Care | Payer: Medicaid Other | Source: Ambulatory Visit | Attending: Cardiology | Admitting: Cardiology

## 2021-12-31 DIAGNOSIS — I5022 Chronic systolic (congestive) heart failure: Secondary | ICD-10-CM

## 2022-01-01 ENCOUNTER — Other Ambulatory Visit (HOSPITAL_COMMUNITY): Payer: Self-pay

## 2022-01-01 ENCOUNTER — Telehealth: Payer: Self-pay | Admitting: Licensed Clinical Social Worker

## 2022-01-01 NOTE — Telephone Encounter (Signed)
H&V Care Navigation CSW Progress Note  Clinical Social Worker  contacted by [employer], paramedic,  to arrange an appt at [facility]. Confirmed pt has a ride, I have reached out to the program Caryl Pina at [phone] to see if an appt is available.   Patient is participating in a Managed Medicaid Plan:  No, Medicaid [place]: No Food Insecurity (09/14/2020)  Recent Concern: [place] Present (09/13/2020)  Housing: Low Risk  (12/15/2020)  Transportation Needs: Unmet Transportation Needs (12/15/2020)  Utilities: At Risk (12/18/2021)  Depression (PHQ2-9): Medium Risk (11/13/2021)  Financial Resource Strain: High Risk (10/18/2021)  Tobacco Use: Low Risk  (12/05/2021)    Westley Hummer, MSW, [place]  [phone]- work cell phone (preferred) [phone]- desk phone

## 2022-01-01 NOTE — Progress Notes (Signed)
Paramedicine Encounter    Patient ID: Jason Stevenson, male    DOB: 03/31/1986, 36 y.o.   MRN: [id number]   Arrived for home visit for Emile who reports feeling good with some complaints of mild muscle pain over the left chest that increases on inhalation and on ROM. He says he recently increased his resistance training at [place] and feels like that has caused the increased muscular pain. I advised him to try heating pad, ice, Tylenol. He denied dizziness, shortness of breath.   Lungs clear on assessment. No lower leg swelling. No weight gain.   BP- 116/86 HR- 80 RR- 16 O2- 98% CBG- 170  Ernst will be completing Cardiac Rehab on October 6th. I called and spoke to [place], who reports she needs records faxed over from Cardiac Rehab office indicating the dates he started and ended rehab. I messaged Probation officer at Cardiac Rehab asking for her to fax over same. I will follow up.   Jone Baseman is requesting a follow up in the APP clinic at the HF clinic- I will reach out for same.   He is also wanting a follow up with PCP, I will reach out for same.   He inquired about food pantries and clothing resources. I obtained [employer] referral and asked Isabel C. For a BOBS CLOSET Referral. She is assisting with same.   I reviewed meds in bubble packs, appointments and confirmed same.   We planned for visit in two weeks. I will follow up with appointment and referral status. He agreed with plans. Home visit complete.   [name], [place] 01/01/2022     Patient Care Team: Ladell Pier, MD as PCP - General (Internal Medicine) Josue Hector, MD as PCP - Cardiology (Cardiology) Vickie Epley, MD as PCP - Electrophysiology (Cardiology)  Patient Active Problem List   Diagnosis Date Noted   Nonischemic cardiomyopathy [facility]) 08/27/2021   LADA (latent autoimmune diabetes in adults), managed as type 2 ([place])  03/28/2021   Poorly controlled type 2 diabetes mellitus with circulatory disorder ([place]) 03/19/2021   Ischemic cardiomyopathy    Syncope 12/14/2020   Chest pain 10/30/2020   History of cerebrovascular accident (CVA) with residual deficit 10/27/2020   Protein-calorie malnutrition, severe 09/22/2020   Acute CVA (cerebrovascular accident) ([place]) 09/21/2020   Near syncope 09/21/2020   Encounter for monitoring Coumadin therapy 09/20/2020   LV (left ventricular) mural thrombus 09/13/2020   Seizure ([place]) 09/13/2020   Neurological deficit present 09/12/2020   History of COVID-19 09/12/2020   Intracerebral hemorrhage 09/12/2020   Acute cerebrovascular accident (CVA) ([place]) 09/12/2020   COVID-19 08/23/2020   Noncompliance with medication treatment due to intermittent use of medication 10/12/2018   Dyslipidemia 59/56/3875   Chronic systolic CHF (congestive heart failure) ([place]) 06/18/2015   Needs flu shot 12/23/2013   Non-ischemic cardiomyopathy ([place]) 11/06/2012   HTN (hypertension) 11/06/2012   HLD (hyperlipidemia) 11/06/2012    Current Outpatient Medications:    Accu-Chek Softclix Lancets lancets, Use to check blood sugar three times daily., Disp: 100 each, Rfl: 3   acetaminophen (TYLENOL) 325 MG tablet, Take 1-2 tablets (325-650 mg total) by mouth every 4 (four) hours as needed for mild pain., Disp: , Rfl:    atorvastatin (LIPITOR) 80 MG tablet, TAKE 1 TABLET (80 MG TOTAL) BY MOUTH DAILY (EVENING), Disp: 90 tablet, Rfl: 0   Blood Glucose Monitoring Suppl (ACCU-CHEK GUIDE) w/Device KIT, Use to check blood sugar three times daily., Disp: 1 kit,  Rfl: 0   Blood Pressure Monitor DEVI, Use as directed to check home blood pressure 2-3 times a week, Disp: 1 Device, Rfl: 0   Continuous Blood Gluc Receiver (FREESTYLE LIBRE 2 READER) DEVI, 1 each by Does not apply route daily., Disp: 1 each, Rfl: 0   Continuous Blood Gluc Sensor (FREESTYLE LIBRE 2 SENSOR) MISC, USE EVERY 14 (FOURTEEN) DAYS., Disp: 2 each,  Rfl: 6   dapagliflozin propanediol (FARXIGA) 5 MG TABS tablet, Take 1 tablet (5 mg total) by mouth daily before breakfast., Disp: 30 tablet, Rfl: 6   digoxin (LANOXIN) 0.125 MG tablet, Take 1 tablet (0.125 mg total) by mouth daily. Please request Cardiologist for future refills., Disp: 30 tablet, Rfl: 6   DULoxetine (CYMBALTA) 30 MG capsule, TAKE 1 CAPSULE (30 MG TOTAL) BY MOUTH DAILY., Disp: 30 capsule, Rfl: 2   Evolocumab (REPATHA SURECLICK) 453 MG/ML SOAJ, Inject 140 mg into the skin every 14 (fourteen) days., Disp: 2 mL, Rfl: 11   ezetimibe (ZETIA) 10 MG tablet, Take 1 tablet (10 mg total) by mouth daily., Disp: 30 tablet, Rfl: 3   furosemide (LASIX) 20 MG tablet, Take 1 tablet (20 mg total) by mouth 3 (three) times a week. Mondays Wednesdays and Friday, Disp: 30 tablet, Rfl: 11   glucose blood (ACCU-CHEK GUIDE) test strip, Use to check blood sugar three times daily., Disp: 100 each, Rfl: 2   insulin aspart (FIASP FLEXTOUCH) 100 UNIT/ML FlexTouch Pen, Inject 5-8 Units into the skin 3 (three) times daily before meals., Disp: 30 mL, Rfl: 3   insulin degludec (TRESIBA FLEXTOUCH) 200 UNIT/ML FlexTouch Pen, Inject 20 Units into the skin daily., Disp: 30 mL, Rfl: 3   Insulin Pen Needle 32G X 4 MM MISC, Use to inject insulin., Disp: 200 each, Rfl: 2   Insulin Syringe-Needle U-100 (RELION INSULIN SYRINGE) 31G X 15/64" 0.3 ML MISC, Use to inject insulin daily., Disp: 100 each, Rfl: 11   ivabradine (CORLANOR) 5 MG TABS tablet, Take 1 tablet (5 mg total) by mouth 2 (two) times daily with a meal., Disp: 60 tablet, Rfl: 6   levETIRAcetam (KEPPRA) 750 MG tablet, Take 2 tablets (1,500 mg total) by mouth 2 (two) times daily., Disp: 360 tablet, Rfl: 4   Menthol, Topical Analgesic, (ICY HOT BACK EX), Apply 1 application topically as needed (neuropathy). Icy hot cream, Disp: , Rfl:    metFORMIN (GLUCOPHAGE-XR) 500 MG 24 hr tablet, Take 1 tablet (500 mg total) by mouth 2 (two) times daily with a meal., Disp: 180  tablet, Rfl: 3   metoprolol (TOPROL-XL) 200 MG 24 hr tablet, TAKE 0.5 TABLETS (100 MG TOTAL) BY MOUTH DAILY. TAKE WITH OR IMMEDIATELY FOLLOWING A MEAL (EVENING), Disp: 15 tablet, Rfl: 6   Oxcarbazepine (TRILEPTAL) 300 MG tablet, Take 2 tablets (600 mg total) by mouth 2 (two) times daily., Disp: 360 tablet, Rfl: 4   phenytoin (DILANTIN) 100 MG ER capsule, Take 1 capsule (100 mg total) by mouth 3 (three) times daily., Disp: 270 capsule, Rfl: 4   potassium chloride SA (KLOR-CON M) 20 MEQ tablet, Take 1 tablet (20 mEq total) by mouth 3 (three) times a week. Take every Mon, Wed and Fri with Lasix, Disp: 30 tablet, Rfl: 4   pregabalin (LYRICA) 300 MG capsule, TAKE 1 CAPSULE (300 MG TOTAL) BY MOUTH AT BEDTIME., Disp: 30 capsule, Rfl: 2   sacubitril-valsartan (ENTRESTO) 49-51 MG, Take 1 tablet by mouth 2 (two) times daily., Disp: 60 tablet, Rfl: 3   spironolactone (ALDACTONE) 25 MG tablet,  Take 1 tablet (25 mg total) by mouth at bedtime., Disp: 90 tablet, Rfl: 3   warfarin (COUMADIN) 5 MG tablet, Take 1-1.5 tablets (5-7.5 mg total) by mouth daily as directed by Coumadin clinic, Disp: 40 tablet, Rfl: 3 No Known Allergies   Social History   Socioeconomic History   Marital status: Married    Spouse name: [name]   Number of children: 0   Years of education: 13   Highest education level: Some college, no degree  Occupational History   Occupation: unemployed  Tobacco Use   Smoking status: Never   Smokeless tobacco: Never  Vaping Use   Vaping Use: Not on file  Substance and Sexual Activity   Alcohol use: Not Currently    Comment: "occasional" when "hanging out with the wrong people" No recent use.   Drug use: Not Currently    Types: Marijuana    Comment: occasional, last 2013   Sexual activity: Not Currently  Other Topics Concern   Not on file  Social History Narrative   Pt lives at home with his wife    Right handed    Caffeine- hardly any   Social Determinants of Health   Financial  Resource Strain: High Risk (10/18/2021)   Overall Financial Resource Strain (CARDIA)    Difficulty of Paying Living Expenses: Hard  Food Insecurity: No Food Insecurity (09/14/2020)   Hunger Vital Sign    Worried About Running Out of Food in the Last Year: Never true    Ran Out of Food in the Last Year: Never true  Recent Concern: Food Insecurity - Food Insecurity Present (09/13/2020)   Hunger Vital Sign    Worried About Running Out of Food in the Last Year: Never true    Ran Out of Food in the Last Year: Sometimes true  Transportation Needs: Unmet Transportation Needs (12/15/2020)   PRAPARE - Hydrologist (Medical): No    Lack of Transportation (Non-Medical): Yes  Physical Activity: Not on file  Stress: Not on file  Social Connections: Not on file  Intimate Partner Violence: Not on file    Physical Exam      Future Appointments  Date Time Provider [place]  01/04/2022  6:45 AM MC-CREHA PHASE II EXC [facility]-RE[facility] None  01/07/2022  6:45 AM [facility] PHASE II EXC MC-RE[facility]SC None  01/09/2022  6:45 AM [facility] PHASE II EXC MC-RE[facility]SC None  01/11/2022  6:45 AM MC-CRE[facility] PHASE II EXC MC-RE[facility]SC None  01/14/2022  6:45 AM MC-CRE[facility] PHASE II EXC MC-REHSC None  01/16/2022  6:45 AM MC-CRE[facility] PHASE II EXC MC-REHSC None  01/18/2022  6:45 AM MC-[facility] PHASE II EXC MC-RE[facility]SC None  01/21/2022  9:30 AM [facility] None  02/28/2022  7:00 AM [facility] DEVICE REMOTES [facility]  03/18/2022  8:30 AM [facility] LAB [facility] None  03/21/2022  8:40 AM Philemon Kingdom, MD [facility] None  03/22/2022  8:40 AM Philemon Kingdom, MD [facility] None  05/30/2022  7:00 AM [facility] DEVICE REMOTES [facility]  07/01/2022  1:30 PM Penumalli, Earlean Polka, MD [facility] None  08/29/2022  7:00 AM [facility] DEVICE REMOTES [facility]  11/28/2022  7:00 AM [facility] DEVICE REMOTES [facility]     ACTION: Home visit  completed

## 2022-01-01 NOTE — Progress Notes (Signed)
Cardiac Individual Treatment Plan  Patient Details  Name: Jason Stevenson MRN: [id number] Date of Birth: 1985/04/17 Referring Provider:   Flowsheet Row CARDIAC REHAB PHASE II ORIENTATION from 11/13/2021 in [facility]  Referring Provider Rowland Lathe MD       Initial Encounter Date:  [place] PHASE II ORIENTATION from 11/13/2021 in [facility]  Date 11/13/21       Visit Diagnosis: Heart failure, chronic systolic ([place])  Patient's Home Medications on Admission:  Current Outpatient Medications:    Accu-Chek Softclix Lancets lancets, Use to check blood sugar three times daily., Disp: 100 each, Rfl: 3   acetaminophen (TYLENOL) 325 MG tablet, Take 1-2 tablets (325-650 mg total) by mouth every 4 (four) hours as needed for mild pain., Disp: , Rfl:    atorvastatin (LIPITOR) 80 MG tablet, TAKE 1 TABLET (80 MG TOTAL) BY MOUTH DAILY (EVENING), Disp: 90 tablet, Rfl: 0   Blood Glucose Monitoring Suppl (ACCU-CHEK GUIDE) w/Device KIT, Use to check blood sugar three times daily., Disp: 1 kit, Rfl: 0   Blood Pressure Monitor DEVI, Use as directed to check home blood pressure 2-3 times a week, Disp: 1 Device, Rfl: 0   Continuous Blood Gluc Receiver (FREESTYLE LIBRE 2 READER) DEVI, 1 each by Does not apply route daily., Disp: 1 each, Rfl: 0   Continuous Blood Gluc Sensor (FREESTYLE LIBRE 2 SENSOR) MISC, USE EVERY 14 (FOURTEEN) DAYS., Disp: 2 each, Rfl: 6   dapagliflozin propanediol (FARXIGA) 5 MG TABS tablet, Take 1 tablet (5 mg total) by mouth daily before breakfast., Disp: 30 tablet, Rfl: 6   digoxin (LANOXIN) 0.125 MG tablet, Take 1 tablet (0.125 mg total) by mouth daily. Please request Cardiologist for future refills., Disp: 30 tablet, Rfl: 6   DULoxetine (CYMBALTA) 30 MG capsule, TAKE 1 CAPSULE (30 MG TOTAL) BY MOUTH DAILY., Disp: 30 capsule, Rfl: 2   Evolocumab (REPATHA SURECLICK) 481 MG/ML SOAJ, Inject 140 mg into the  skin every 14 (fourteen) days., Disp: 2 mL, Rfl: 11   ezetimibe (ZETIA) 10 MG tablet, Take 1 tablet (10 mg total) by mouth daily., Disp: 30 tablet, Rfl: 3   furosemide (LASIX) 20 MG tablet, Take 1 tablet (20 mg total) by mouth 3 (three) times a week. Mondays Wednesdays and Friday, Disp: 30 tablet, Rfl: 11   glucose blood (ACCU-CHEK GUIDE) test strip, Use to check blood sugar three times daily., Disp: 100 each, Rfl: 2   insulin aspart (FIASP FLEXTOUCH) 100 UNIT/ML FlexTouch Pen, Inject 5-8 Units into the skin 3 (three) times daily before meals., Disp: 30 mL, Rfl: 3   insulin degludec (TRESIBA FLEXTOUCH) 200 UNIT/ML FlexTouch Pen, Inject 20 Units into the skin daily., Disp: 30 mL, Rfl: 3   Insulin Pen Needle 32G X 4 MM MISC, Use to inject insulin., Disp: 200 each, Rfl: 2   Insulin Syringe-Needle U-100 (RELION INSULIN SYRINGE) 31G X 15/64" 0.3 ML MISC, Use to inject insulin daily., Disp: 100 each, Rfl: 11   ivabradine (CORLANOR) 5 MG TABS tablet, Take 1 tablet (5 mg total) by mouth 2 (two) times daily with a meal., Disp: 60 tablet, Rfl: 6   levETIRAcetam (KEPPRA) 750 MG tablet, Take 2 tablets (1,500 mg total) by mouth 2 (two) times daily., Disp: 360 tablet, Rfl: 4   Menthol, Topical Analgesic, (ICY HOT BACK EX), Apply 1 application topically as needed (neuropathy). Icy hot cream, Disp: , Rfl:    metFORMIN (GLUCOPHAGE-XR) 500 MG 24 hr tablet,  Take 1 tablet (500 mg total) by mouth 2 (two) times daily with a meal., Disp: 180 tablet, Rfl: 3   metoprolol (TOPROL-XL) 200 MG 24 hr tablet, TAKE 0.5 TABLETS (100 MG TOTAL) BY MOUTH DAILY. TAKE WITH OR IMMEDIATELY FOLLOWING A MEAL (EVENING), Disp: 15 tablet, Rfl: 6   Oxcarbazepine (TRILEPTAL) 300 MG tablet, Take 2 tablets (600 mg total) by mouth 2 (two) times daily., Disp: 360 tablet, Rfl: 4   phenytoin (DILANTIN) 100 MG ER capsule, Take 1 capsule (100 mg total) by mouth 3 (three) times daily., Disp: 270 capsule, Rfl: 4   potassium chloride SA (KLOR-CON M) 20 MEQ  tablet, Take 1 tablet (20 mEq total) by mouth 3 (three) times a week. Take every Mon, Wed and Fri with Lasix, Disp: 30 tablet, Rfl: 4   pregabalin (LYRICA) 300 MG capsule, TAKE 1 CAPSULE (300 MG TOTAL) BY MOUTH AT BEDTIME., Disp: 30 capsule, Rfl: 2   sacubitril-valsartan (ENTRESTO) 49-51 MG, Take 1 tablet by mouth 2 (two) times daily., Disp: 60 tablet, Rfl: 3   spironolactone (ALDACTONE) 25 MG tablet, Take 1 tablet (25 mg total) by mouth at bedtime., Disp: 90 tablet, Rfl: 3   warfarin (COUMADIN) 5 MG tablet, Take 1-1.5 tablets (5-7.5 mg total) by mouth daily as directed by Coumadin clinic, Disp: 40 tablet, Rfl: 3  Past Medical History: Past Medical History:  Diagnosis Date   Chronic systolic CHF (congestive heart failure) (HCC) 06/18/2015   Diabetes mellitus without complication ([place])    Hemorrhagic stroke ([place])    Hyperlipidemia    Hypertension    Nonischemic cardiomyopathy ([place]) Noted as early as 2007   Per chart review (cards consult note 2011), EF of 40% in 2007, down to 20-25% in 2011    Tobacco Use: Social History   Tobacco Use  Smoking Status Never  Smokeless Tobacco Never    Labs: Review Flowsheet  More data exists      Latest Ref Rng & Units 01/29/2021 04/19/2021 06/08/2021 09/21/2021 11/15/2021  Labs for ITP Cardiac and Pulmonary Rehab  Cholestrol 0 - 200 mg/dL - - 107  133  -  LDL (calc) 0 - 99 mg/dL - - 31  56  -  HDL-C >40 mg/dL - - 49  47  -  Trlycerides <150 mg/dL - - 166  149  -  Hemoglobin A1c 4.0 - 5.6 % 8.2  7.7  - - 8.7     Capillary Blood Glucose: Lab Results  Component Value Date   GLUCAP 152 (H) 11/26/2021   GLUCAP 140 (H) 11/21/2021   GLUCAP 194 (H) 11/19/2021   GLUCAP 165 (H) 11/19/2021   GLUCAP 165 (H) 11/13/2021     Exercise Target Goals: Exercise Program Goal: Individual exercise prescription set using results from initial 6 min walk test and THRR while considering  patient's activity barriers and safety.   Exercise Prescription  Goal: Initial exercise prescription builds to 30-45 minutes a day of aerobic activity, 2-3 days per week.  Home exercise guidelines will be given to patient during program as part of exercise prescription that the participant will acknowledge.  Activity Barriers & Risk Stratification:  Activity Barriers & Cardiac Risk Stratification - 11/13/21 1220       Activity Barriers & Cardiac Risk Stratification   Activity Barriers Back Problems;Deconditioning;Neck/Spine Problems;Decreased Ventricular Function;Balance Concerns;Shortness of Breath;Other (comment)    Comments Neuropathy in hands and feet    Cardiac Risk Stratification High  6 Minute Walk:  6 Minute Walk     Row Name 11/13/21 0955         6 Minute Walk   Phase Initial     Distance 770 feet     Walk Time 6 minutes     # of Rest Breaks 1  5:05-80mn, lightheaded and fatigue     MPH 1.46     METS 3.8     RPE 13     Perceived Dyspnea  2     VO2 Peak 13.3     Symptoms Yes (comment)     Comments Lightheadedness, fatigue, RPD 2     Resting HR 64 bpm     Resting BP 110/70     Resting Oxygen Saturation  98 %     Exercise Oxygen Saturation  during 6 min walk 100 %     Max Ex. HR 70 bpm     Max Ex. BP 110/70     2 Minute Post BP 105/70              Oxygen Initial Assessment:   Oxygen Re-Evaluation:   Oxygen Discharge (Final Oxygen Re-Evaluation):   Initial Exercise Prescription:  Initial Exercise Prescription - 11/13/21 1200       Date of Initial Exercise RX and Referring Provider   Date 11/13/21    Referring Provider DRowland LatheMD    Expected Discharge Date 01/18/22      NuStep   Level 1    SPM 75    Minutes 25    METs 2.5      Prescription Details   Frequency (times per week) 3    Duration Progress to 10 minutes continuous walking  at current work load and total walking time to 30-45 min      Intensity   THRR 40-80% of Max Heartrate 73-146    Ratings of Perceived Exertion 11-13     Perceived Dyspnea 0-4      Progression   Progression Continue progressive overload as per policy without signs/symptoms or physical distress.      Resistance Training   Training Prescription Yes    Weight 3lbs    Reps 10-15             Perform Capillary Blood Glucose checks as needed.  Exercise Prescription Changes:   Exercise Prescription Changes     Row Name 11/19/21 0818 12/19/21 0922           Response to Exercise   Blood Pressure (Admit) 115/74 108/80      Blood Pressure (Exercise) 108/72 120/74      Blood Pressure (Exit) 121/83 90/60      Heart Rate (Admit) 76 bpm 66 bpm      Heart Rate (Exercise) 75 bpm 88 bpm      Heart Rate (Exit) 69 bpm 69 bpm      Rating of Perceived Exertion (Exercise) 13 11      Perceived Dyspnea (Exercise) 0 0      Symptoms 0 0      Comments Pt first day in the CRP2 program Reviewed MET's, goals and home ExRx      Duration Progress to 30 minutes of  aerobic without signs/symptoms of physical distress Progress to 30 minutes of  aerobic without signs/symptoms of physical distress      Intensity THRR unchanged THRR unchanged        Progression   Progression Continue to progress workloads to maintain intensity without signs/symptoms  of physical distress. Continue to progress workloads to maintain intensity without signs/symptoms of physical distress.      Average METs 1 2.8        Resistance Training   Training Prescription Yes No      Weight 3lbs --      Reps 10-15 --      Time 10 Minutes --        NuStep   Level 1 2      SPM 75 90      Minutes 30 30      METs 1 2.5        Home Exercise Plan   Plans to continue exercise at -- Home (comment)      Frequency -- Add 2 additional days to program exercise sessions.      Initial Home Exercises Provided -- 12/19/21               Exercise Comments:   Exercise Comments     Row Name 11/19/21 0822 12/19/21 0929         Exercise Comments Pt first day in the CRP2 program. Pt  tolerated low level activity well with an average MET level od 1.0. Pt is learning his THRR, RPE and Ex Rx. Reviewed MET's, goals and home ExRx with pt today. Pt tolerated exercise well with an average MET level of 2.8. Pt will continue to exercise on his own by walking 2-3 days a week with his wife. Pt is interested in joining a local recreation center, encouraged him to look into this further. Pt feels good about his goals of gaining strength and stamina and is feeling less fearful and more confident. Overall pt is doing well and he notes that his family seems to notice he feels better and etting back to his old self.               Exercise Goals and Review:   Exercise Goals     Row Name 11/13/21 1223             Exercise Goals   Increase Physical Activity Yes       Intervention Provide advice, education, support and counseling about physical activity/exercise needs.;Develop an individualized exercise prescription for aerobic and resistive training based on initial evaluation findings, risk stratification, comorbidities and participant's personal goals.       Expected Outcomes Long Term: Exercising regularly at least 3-5 days a week.;Long Term: Add in home exercise to make exercise part of routine and to increase amount of physical activity.;Short Term: Attend rehab on a regular basis to increase amount of physical activity.       Increase Strength and Stamina Yes       Intervention Provide advice, education, support and counseling about physical activity/exercise needs.;Develop an individualized exercise prescription for aerobic and resistive training based on initial evaluation findings, risk stratification, comorbidities and participant's personal goals.       Expected Outcomes Short Term: Increase workloads from initial exercise prescription for resistance, speed, and METs.;Short Term: Perform resistance training exercises routinely during rehab and add in resistance training at  home;Long Term: Improve cardiorespiratory fitness, muscular endurance and strength as measured by increased METs and functional capacity (6MWT)       Able to understand and use rate of perceived exertion (RPE) scale Yes       Intervention Provide education and explanation on how to use RPE scale       Expected Outcomes Long Term:  Able  to use RPE to guide intensity level when exercising independently;Short Term: Able to use RPE daily in rehab to express subjective intensity level       Knowledge and understanding of Target Heart Rate Range (THRR) Yes       Intervention Provide education and explanation of THRR including how the numbers were predicted and where they are located for reference       Expected Outcomes Short Term: Able to state/look up THRR;Long Term: Able to use THRR to govern intensity when exercising independently;Short Term: Able to use daily as guideline for intensity in rehab       Understanding of Exercise Prescription Yes       Intervention Provide education, explanation, and written materials on patient's individual exercise prescription       Expected Outcomes Long Term: Able to explain home exercise prescription to exercise independently;Short Term: Able to explain program exercise prescription                Exercise Goals Re-Evaluation :  Exercise Goals Re-Evaluation     [place] Name 11/19/21 0820 12/19/21 0925           Exercise Goal Re-Evaluation   Exercise Goals Review Increase Physical Activity;Increase Strength and Stamina;Able to understand and use rate of perceived exertion (RPE) scale;Knowledge and understanding of Target Heart Rate Range (THRR);Understanding of Exercise Prescription Increase Physical Activity;Increase Strength and Stamina;Able to understand and use rate of perceived exertion (RPE) scale;Knowledge and understanding of Target Heart Rate Range (THRR);Understanding of Exercise Prescription      Comments Pt first day in the CRP2 program. Pt  tolerated low level activity well with an average MEt level od 1.0. Pt is learning his THRR, RPE and Ex Rx. Reviewed MET's, goals and home ExRx with pt today. Pt tolerated exercise well with an average MET level of 2.8. Pt will continue to exercise on his own by walking 2-3 days a week with his wife. Pt is interested in joining a local recreation center, encouraged him to look into this further. Pt feels good about his goals of gaining strength and stamina and is feeling less fearful and more confident. Overall pt is doing well and he notes that his family seems to notice he feels better and etting back to his old self.      Expected Outcomes Will continue to monitor pt and progress workloads as tolerated without sign or symptom Pt will continue to exercise on his own at home. Will continue to monitor pt and progress workloads as tolerated without sign or symptom               Discharge Exercise Prescription (Final Exercise Prescription Changes):  Exercise Prescription Changes - 12/19/21 0922       Response to Exercise   Blood Pressure (Admit) 108/80    Blood Pressure (Exercise) 120/74    Blood Pressure (Exit) 90/60    Heart Rate (Admit) 66 bpm    Heart Rate (Exercise) 88 bpm    Heart Rate (Exit) 69 bpm    Rating of Perceived Exertion (Exercise) 11    Perceived Dyspnea (Exercise) 0    Symptoms 0    Comments Reviewed MET's, goals and home ExRx    Duration Progress to 30 minutes of  aerobic without signs/symptoms of physical distress    Intensity THRR unchanged      Progression   Progression Continue to progress workloads to maintain intensity without signs/symptoms of physical distress.    Average METs 2.8  Resistance Training   Training Prescription No      NuStep   Level 2    SPM 90    Minutes 30    METs 2.5      Home Exercise Plan   Plans to continue exercise at Home (comment)    Frequency Add 2 additional days to program exercise sessions.    Initial Home Exercises  Provided 12/19/21             Nutrition:  Target Goals: Understanding of nutrition guidelines, daily intake of sodium <1522m, cholesterol <2036m calories 30% from fat and 7% or less from saturated fats, daily to have 5 or more servings of fruits and vegetables.  Biometrics:  Pre Biometrics - 11/13/21 0913       Pre Biometrics   Waist Circumference 33.5 inches    Hip Circumference 37.5 inches    Waist to Hip Ratio 0.89 %    Triceps Skinfold 9 mm    % Body Fat 18.9 %    Grip Strength 40 kg    Flexibility --   Not done due to pt reporting back pain   Single Leg Stand 17.31 seconds              Nutrition Therapy Plan and Nutrition Goals:  Nutrition Therapy & Goals - 12/31/21 0847       Nutrition Therapy   Diet Heart Healthy/Carbohydrate Consistent Diet    Drug/Food Interactions Coumadin/Vit K;Statins/Certain Fruits      Personal Nutrition Goals   Nutrition Goal Patient to choose a daily variety of fruit, vegetables, whole grains, lean protein/plant protein, nonfat dairy as part of heart healthy lifestyle    Personal Goal #2 Patient to limit to <15004mf sodium daily    Personal Goal #3 Patient to identify and limit food sources of saturated fat, trans fat, sodium, and refined carbohydrates    Comments Goals in progress. 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 A1c was up to 8.7 on 11/15/2021. He continues to have some higher blood sugar readings in the evenings. Patient reports good understanding of carbohydrate counting; he continues regular follow-up with endocrinology. Patient unable to attend the [facility] education series due to insurance limitations.      Intervention Plan   Intervention Prescribe, educate and counsel regarding individualized specific dietary modifications aiming towards targeted core components such as weight, hypertension, lipid management, diabetes, heart failure and other comorbidities.;Nutrition  handout(s) given to patient.    Expected Outcomes Short Term Goal: Understand basic principles of dietary content, such as calories, fat, sodium, cholesterol and nutrients.;Long Term Goal: Adherence to prescribed nutrition plan.             Nutrition Assessments:  MEDIFICTS Score Key: ?70 Need to make dietary changes  40-70 Heart Healthy Diet ? 40 Therapeutic Level Cholesterol Diet    Picture Your Plate Scores: <40<44healthy dietary pattern with much room for improvement. 41-50 Dietary pattern unlikely to meet recommendations for good health and room for improvement. 51-60 More healthful dietary pattern, with some room for improvement.  >60 Healthy dietary pattern, although there may be some specific behaviors that could be improved.    Nutrition Goals Re-Evaluation:  Nutrition Goals Re-Evaluation     [place] 11/19/21 1135 11/30/21 1049 12/31/21 0847         Goals   Current Weight 165 lb 12.6 oz (75.2 kg) 163 lb 5.8 oz (74.1 kg) 160 lb 0.9 oz (72.6 kg)  Comment Triglycerides 166, A1c 7.8 Patient is up 4.6# since orientation weight 11/13/2021. no new labs at this time. Most recent labs show lipids WNL, A1c 8.7, ALT 59. He is up ~1# since starting with our program though weight has fluctuated.     Expected Outcome Expect improvement in blood sugar control and lipid panel with heart healthy diet changes/intervention. Goals in progress. 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 Latravis reports some decreased appetite. His A1c has improved to 8.7 from 15.5 1 year ago; however, it was down to 7.7 on 04/19/2021. He continues Fiasp 6-10 units with meals, Tresiba 20 units, Farxiga 20 units, metformin 2x/day with meals.He continues warfarin. He is being worked up through the [facility] transplant team. He reports good understanding and confidence of carbohydrate counting. Reviewed eating frequency, high protein/high fiber snacks, and steps for treating  low blood sugars. Goals in progress. 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 A1c was up to 8.7 on 11/15/2021. He continues to have some higher blood sugar readings in the evenings. Patient reports good understanding of carbohydrate counting; he continues regular follow-up with endocrinology. Patient unable to attend the [facility] education series due to insurance limitations.              Nutrition Goals Re-Evaluation:  Nutrition Goals Re-Evaluation     [place] Name 11/19/21 1135 11/30/21 1049 12/31/21 0847         Goals   Current Weight 165 lb 12.6 oz (75.2 kg) 163 lb 5.8 oz (74.1 kg) 160 lb 0.9 oz (72.6 kg)     Comment Triglycerides 166, A1c 7.8 Patient is up 4.6# since orientation weight 11/13/2021. no new labs at this time. Most recent labs show lipids WNL, A1c 8.7, ALT 59. He is up ~1# since starting with our program though weight has fluctuated.     Expected Outcome Expect improvement in blood sugar control and lipid panel with heart healthy diet changes/intervention. Goals in progress. 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 Delma reports some decreased appetite. His A1c has improved to 8.7 from 15.5 1 year ago; however, it was down to 7.7 on 04/19/2021. He continues Fiasp 6-10 units with meals, Tresiba 20 units, Farxiga 20 units, metformin 2x/day with meals.He continues warfarin. He is being worked up through the [facility] transplant team. He reports good understanding and confidence of carbohydrate counting. Reviewed eating frequency, high protein/high fiber snacks, and steps for treating low blood sugars. Goals in progress. 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 A1c was up to 8.7 on 11/15/2021. He continues to have some higher blood sugar readings in the evenings. Patient reports good understanding of carbohydrate counting; he continues regular follow-up  with endocrinology. Patient unable to attend the [facility] education series due to insurance limitations.              Nutrition Goals Discharge (Final Nutrition Goals Re-Evaluation):  Nutrition Goals Re-Evaluation - 12/31/21 0847       Goals   Current Weight 160 lb 0.9 oz (72.6 kg)    Comment no new labs at this time. Most recent labs show lipids WNL, A1c 8.7, ALT 59. He is up ~1# since starting with our program though weight has fluctuated.    Expected Outcome Goals in progress. 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  A1c was up to 8.7 on 11/15/2021. He continues to have some higher blood sugar readings in the evenings. Patient reports good understanding of carbohydrate counting; he continues regular follow-up with endocrinology. Patient unable to attend the [facility] education series due to insurance limitations.             Psychosocial: Target Goals: Acknowledge presence or absence of significant depression and/or stress, maximize coping skills, provide positive support system. Participant is able to verbalize types and ability to use techniques and skills needed for reducing stress and depression.  Initial Review & Psychosocial Screening:  Initial Psych Review & Screening - 11/13/21 1206       Initial Review   Current issues with History of Depression;Current Depression;Current Sleep Concerns;Current Stress Concerns    Source of Stress Concerns Chronic Illness;Transportation;Financial    Comments Patient reports expirencing low level depression, states current antidepressants are effective however he could benefit from counseling, good support sysytem with wife and brother and law who is a Theme park manager.      Family Dynamics   Good Support System? Yes   Korin states his spouse is a good support for him, physically, financially and emotionally, he also has good support in his brother in law who is a Theme park manager.     Barriers    Psychosocial barriers to participate in program The patient should benefit from training in stress management and relaxation.      Screening Interventions   Interventions Encouraged to exercise;To provide support and resources with identified psychosocial needs;Provide feedback about the scores to participant    Expected Outcomes Short Term goal: Identification and review with participant of any Quality of Life or Depression concerns found by scoring the questionnaire.;Long Term goal: The participant improves quality of Life and PHQ9 Scores as seen by post scores and/or verbalization of changes;Long Term Goal: Stressors or current issues are controlled or eliminated.;Short Term goal: Utilizing psychosocial counselor, staff and physician to assist with identification of specific Stressors or current issues interfering with healing process. Setting desired goal for each stressor or current issue identified.             Quality of Life Scores:  Quality of Life - 11/13/21 1211       Quality of Life   Select Quality of Life      Quality of Life Scores   Health/Function Pre 15.23 %    Socioeconomic Pre 8.29 %    Psych/Spiritual Pre 20.57 %    Family Pre 21 %    GLOBAL Pre 15.59 %            Scores of 19 and below usually indicate a poorer quality of life in these areas.  A difference of  2-3 points is a clinically meaningful difference.  A difference of 2-3 points in the total score of the Quality of Life Index has been associated with significant improvement in overall quality of life, self-image, physical symptoms, and general health in studies assessing change in quality of life.  PHQ-9: Review Flowsheet  More data exists      11/13/2021 04/20/2021 02/05/2021 10/27/2020 04/26/2019  Depression screen PHQ 2/9  Decreased Interest 0 1 1 0 0  Down, Depressed, Hopeless _0 0  PHQ - 2 Score _1 0  Altered sleeping _2 - -  Tired, decreased energy _3 - -  Change in appetite _4 - -  Feeling bad or failure about yourself  0 0 1 - -  Trouble concentrating 1 0 0 - -  Moving slowly or fidgety/restless 1 0 0 - -  Suicidal thoughts 1 0 0 - -  PHQ-9 Score _0 - -  Difficult doing work/chores Somewhat difficult - - - -   Interpretation of Total Score  Total Score Depression Severity:  1-4 = Minimal depression, 5-9 = Mild depression, 10-14 = Moderate depression, 15-19 = Moderately severe depression, 20-27 = Severe depression   Psychosocial Evaluation and Intervention:  Psychosocial Evaluation - 12/05/21 0734       Psychosocial Evaluation & Interventions   Interventions Encouraged to exercise with the program and follow exercise prescription;Relaxation education;Stress management education    Comments Li who has a  medically complex history admits to having depressive thoughts and feels stressed due to their finances. Harm is fairly new to the phase II and enjoys coming to exercise and has attended some of the education classes based upon his interest. He feels that this has helped him feel better. Kamaree is well supported by his wife and faith community as well as fellow participants.    Expected Outcomes Cullan will report decrease in depresive thoughs with positive outlook on life.  Josefina Do will employ learned stress management tools he has learned in the workshop and videos    Continue Psychosocial Services  Follow up required by staff             Psychosocial Re-Evaluation:  Psychosocial Re-Evaluation     [place] Name 01/01/22 0908             Psychosocial Re-Evaluation   Current issues with Current Stress Concerns       Comments Kaiden continues to make notable progress with stamina, endurance and engery level  his confidence level grows which has decreased his stress level.  He is hopeful for the future and continues to be supported by his wife, faith community and fellow participants.       Expected Outcomes Tor will continue to make  measurable progress and report decrease depression and stress.  Remain hopeful for his future.       Interventions Encouraged to attend Cardiac Rehabilitation for the exercise;Stress management education       Continue Psychosocial Services  No Follow up required                Psychosocial Discharge (Final Psychosocial Re-Evaluation):  Psychosocial Re-Evaluation - 01/01/22 0908       Psychosocial Re-Evaluation   Current issues with Current Stress Concerns    Comments Dieter continues to make notable progress with stamina, endurance and engery level  his confidence level grows which has decreased his stress level.  He is hopeful for the future and continues to be supported by his wife, faith community and fellow participants.    Expected Outcomes Ruairi will continue to make measurable progress and report decrease depression and stress.  Remain hopeful for his future.    Interventions Encouraged to attend Cardiac Rehabilitation for the exercise;Stress management education    Continue Psychosocial Services  No Follow up required             Vocational Rehabilitation: Provide vocational rehab assistance to qualifying candidates.   Vocational Rehab Evaluation & Intervention:  Vocational Rehab - 11/13/21 1217       Initial Vocational Rehab Evaluation & Intervention   Assessment shows need for Vocational Rehabilitation No   unemployed, interested in obtaining disability services  Education: Education Goals: Education classes will be provided on a weekly basis, covering required topics. Participant will state understanding/return demonstration of topics presented.    Education     Row Name 11/28/21 0900     Education   Cardiac Education Topics [employer]   Educator Dietitian   Weekly Topic Adding Flavor - Sodium-Free   Instruction Review Code 1- Verbalizes Understanding   Class Start Time 0815   Class Stop Time  3568   Class Time Calculation (min) 42 min    [place] Name 11/30/21 0800     Education   Cardiac Education Topics Pritikin   Select Core Videos     Core Videos   Educator Nurse   Select General Education   General Education Heart Disease Risk Reduction   Instruction Review Code 1- Verbalizes Understanding   Class Start Time 0815   Class Stop Time 6168   Class Time Calculation (min) 40 min    [place] Name 12/10/21 0900     Education   Cardiac Education Topics Pritikin   Select Workshops     Workshops   Educator Exercise Physiologist   Select Exercise   Exercise Workshop Hotel manager and Fall Prevention   Instruction Review Code 1- Verbalizes Understanding   Class Start Time [phone]   Class Stop Time 0900   Class Time Calculation (min) 50 min            Core Videos: Exercise    Move It!  Clinical staff conducted group or individual video education with verbal and written material and guidebook.  Patient learns the recommended Pritikin exercise program. Exercise with the goal of living a long, healthy life. Some of the health benefits of exercise include controlled diabetes, healthier blood pressure levels, improved cholesterol levels, improved heart and lung capacity, improved sleep, and better body composition. Everyone should speak with their doctor before starting or changing an exercise routine.  Biomechanical Limitations Clinical staff conducted group or individual video education with verbal and written material and guidebook.  Patient learns how biomechanical limitations can impact exercise and how we can mitigate and possibly overcome limitations to have an impactful and balanced exercise routine.  Body Composition Clinical staff conducted group or individual video education with verbal and written material and guidebook.  Patient learns that body composition (ratio of muscle mass to fat mass) is a key component to assessing overall fitness, rather than body weight  alone. Increased fat mass, especially visceral belly fat, can put [place] at increased risk for metabolic syndrome, type 2 diabetes, heart disease, and even death. It is recommended to combine diet and exercise (cardiovascular and resistance training) to improve your body composition. Seek guidance from your physician and exercise physiologist before implementing an exercise routine.  Exercise Action Plan Clinical staff conducted group or individual video education with verbal and written material and guidebook.  Patient learns the recommended strategies to achieve and enjoy long-term exercise adherence, including variety, self-motivation, self-efficacy, and positive decision making. Benefits of exercise include fitness, good health, weight management, more energy, better sleep, less stress, and overall well-being.  Medical   Heart Disease Risk Reduction Clinical staff conducted group or individual video education with verbal and written material and guidebook.  Patient learns our heart is our most vital organ as it circulates oxygen, nutrients, white blood cells, and hormones throughout the entire body, and carries waste away. Data supports a plant-based eating plan like the Pritikin  Program for its effectiveness in slowing progression of and reversing heart disease. The video provides a number of recommendations to address heart disease.   Metabolic Syndrome and Belly Fat  Clinical staff conducted group or individual video education with verbal and written material and guidebook.  Patient learns what metabolic syndrome is, how it leads to heart disease, and how one can reverse it and keep it from coming back. You have metabolic syndrome if you have 3 of the following 5 criteria: abdominal obesity, high blood pressure, high triglycerides, low HDL cholesterol, and high blood sugar.  Hypertension and Heart Disease Clinical staff conducted group or individual video education with verbal and written material  and guidebook.  Patient learns that high blood pressure, or hypertension, is very common in the [place]. Hypertension is largely due to excessive salt intake, but other important risk factors include being overweight, physical inactivity, drinking too much alcohol, smoking, and not eating enough potassium from fruits and vegetables. High blood pressure is a leading risk factor for heart attack, stroke, congestive heart failure, dementia, kidney failure, and premature death. Long-term effects of excessive salt intake include stiffening of the arteries and thickening of heart muscle and organ damage. Recommendations include ways to reduce hypertension and the risk of heart disease.  Diseases of Our Time - Focusing on Diabetes Clinical staff conducted group or individual video education with verbal and written material and guidebook.  Patient learns why the best way to stop diseases of our time is prevention, through food and other lifestyle changes. Medicine (such as prescription pills and surgeries) is often only a Band-Aid on the problem, not a long-term solution. Most common diseases of our time include obesity, type 2 diabetes, hypertension, heart disease, and cancer. The Pritikin Program is recommended and has been proven to help reduce, reverse, and/or prevent the damaging effects of metabolic syndrome.  Nutrition   Overview of the Pritikin Eating Plan  Clinical staff conducted group or individual video education with verbal and written material and guidebook.  Patient learns about the [place] for disease risk reduction. The [place] emphasizes a wide variety of unrefined, minimally-processed carbohydrates, like fruits, vegetables, whole grains, and legumes. Go, Caution, and Stop food choices are explained. Plant-based and lean animal proteins are emphasized. Rationale provided for low sodium intake for blood pressure control, low added sugars for blood sugar  stabilization, and low added fats and oils for coronary artery disease risk reduction and weight management.  Calorie Density  Clinical staff conducted group or individual video education with verbal and written material and guidebook.  Patient learns about calorie density and how it impacts the Pritikin Eating Plan. Knowing the characteristics of the food you choose will help you decide whether those foods will lead to weight gain or weight loss, and whether you want to consume more or less of them. Weight loss is usually a side effect of the Pritikin Eating Plan because of its focus on low calorie-dense foods.  Label Reading  Clinical staff conducted group or individual video education with verbal and written material and guidebook.  Patient learns about the Pritikin recommended label reading guidelines and corresponding recommendations regarding calorie density, added sugars, sodium content, and whole grains.  Dining Out - Part 1  Clinical staff conducted group or individual video education with verbal and written material and guidebook.  Patient learns that restaurant meals can be sabotaging because they can be so high in calories, fat, sodium, and/or sugar. Patient learns recommended  strategies on how to positively address this and avoid unhealthy pitfalls.  Facts on Fats  Clinical staff conducted group or individual video education with verbal and written material and guidebook.  Patient learns that lifestyle modifications can be just as effective, if not more so, as many medications for lowering your risk of heart disease. A Pritikin lifestyle can help to reduce your risk of inflammation and atherosclerosis (cholesterol build-up, or plaque, in the artery walls). Lifestyle interventions such as dietary choices and physical activity address the cause of atherosclerosis. A review of the types of fats and their impact on blood cholesterol levels, along with dietary recommendations to reduce fat  intake is also included.  Nutrition Action Plan  Clinical staff conducted group or individual video education with verbal and written material and guidebook.  Patient learns how to incorporate Pritikin recommendations into their lifestyle. Recommendations include planning and keeping personal health goals in mind as an important part of their success.  Healthy Mind-Set    Healthy Minds, Bodies, Hearts  Clinical staff conducted group or individual video education with verbal and written material and guidebook.  Patient learns how to identify when they are stressed. Video will discuss the impact of that stress, as well as the many benefits of stress management. Patient will also be introduced to stress management techniques. The way we think, act, and feel has an impact on our hearts.  How Our Thoughts Can Heal Our Hearts  Clinical staff conducted group or individual video education with verbal and written material and guidebook.  Patient learns that negative thoughts can cause depression and anxiety. This can result in negative lifestyle behavior and serious health problems. Cognitive behavioral therapy is an effective method to help control our thoughts in order to change and improve our emotional outlook.  Additional Videos:  Exercise    Improving Performance  Clinical staff conducted group or individual video education with verbal and written material and guidebook.  Patient learns to use a non-linear approach by alternating intensity levels and lengths of time spent exercising to help burn more calories and lose more body fat. Cardiovascular exercise helps improve heart health, metabolism, hormonal balance, blood sugar control, and recovery from fatigue. Resistance training improves strength, endurance, balance, coordination, reaction time, metabolism, and muscle mass. Flexibility exercise improves circulation, posture, and balance. Seek guidance from your physician and exercise physiologist  before implementing an exercise routine and learn your capabilities and proper form for all exercise.  Introduction to Yoga  Clinical staff conducted group or individual video education with verbal and written material and guidebook.  Patient learns about yoga, a discipline of the coming together of mind, breath, and body. The benefits of yoga include improved flexibility, improved range of motion, better posture and core strength, increased lung function, weight loss, and positive self-image. Yoga's heart health benefits include lowered blood pressure, healthier heart rate, decreased cholesterol and triglyceride levels, improved immune function, and reduced stress. Seek guidance from your physician and exercise physiologist before implementing an exercise routine and learn your capabilities and proper form for all exercise.  Medical   Aging: Enhancing Your Quality of Life  Clinical staff conducted group or individual video education with verbal and written material and guidebook.  Patient learns key strategies and recommendations to stay in good physical health and enhance quality of life, such as prevention strategies, having an advocate, securing a [place], and keeping a list of medications and system for tracking them. It also discusses  how to avoid risk for bone loss.  Biology of Weight Control  Clinical staff conducted group or individual video education with verbal and written material and guidebook.  Patient learns that weight gain occurs because we consume more calories than we burn (eating more, moving less). Even if your body weight is normal, you may have higher ratios of fat compared to muscle mass. Too much body fat puts you at increased risk for cardiovascular disease, heart attack, stroke, type 2 diabetes, and obesity-related cancers. In addition to exercise, following the [place] can help reduce your risk.  Decoding Lab Results  Clinical  staff conducted group or individual video education with verbal and written material and guidebook.  Patient learns that lab test reflects one measurement whose values change over time and are influenced by many factors, including medication, stress, sleep, exercise, food, hydration, pre-existing medical conditions, and more. It is recommended to use the knowledge from this video to become more involved with your lab results and evaluate your numbers to speak with your doctor.   Diseases of Our Time - Overview  Clinical staff conducted group or individual video education with verbal and written material and guidebook.  Patient learns that according to the CDC, 50% to 70% of chronic diseases (such as obesity, type 2 diabetes, elevated lipids, hypertension, and heart disease) are avoidable through lifestyle improvements including healthier food choices, listening to satiety cues, and increased physical activity.  Sleep Disorders Clinical staff conducted group or individual video education with verbal and written material and guidebook.  Patient learns how good quality and duration of sleep are important to overall health and well-being. Patient also learns about sleep disorders and how they impact health along with recommendations to address them, including discussing with a physician.  Nutrition  Dining Out - Part 2 Clinical staff conducted group or individual video education with verbal and written material and guidebook.  Patient learns how to plan ahead and communicate in order to maximize their dining experience in a healthy and nutritious manner. Included are recommended food choices based on the type of restaurant the patient is visiting.   Fueling a Best boy conducted group or individual video education with verbal and written material and guidebook.  There is a strong connection between our food choices and our health. Diseases like obesity and type 2 diabetes are very  prevalent and are in large-part due to lifestyle choices. The Pritikin Eating Plan provides plenty of food and hunger-curbing satisfaction. It is easy to follow, affordable, and helps reduce health risks.  Menu Workshop  Clinical staff conducted group or individual video education with verbal and written material and guidebook.  Patient learns that restaurant meals can sabotage health goals because they are often packed with calories, fat, sodium, and sugar. Recommendations include strategies to plan ahead and to communicate with the manager, chef, or server to help order a healthier meal.  Planning Your Eating Strategy  Clinical staff conducted group or individual video education with verbal and written material and guidebook.  Patient learns about the [place] and its benefit of reducing the risk of disease. The [place] does not focus on calories. Instead, it emphasizes high-quality, nutrient-rich foods. By knowing the characteristics of the foods, we choose, we can determine their calorie density and make informed decisions.  Targeting Your Nutrition Priorities  Clinical staff conducted group or individual video education with verbal and written material and guidebook.  Patient learns that lifestyle habits  have a tremendous impact on disease risk and progression. This video provides eating and physical activity recommendations based on your personal health goals, such as reducing LDL cholesterol, losing weight, preventing or controlling type 2 diabetes, and reducing high blood pressure.  Vitamins and Minerals  Clinical staff conducted group or individual video education with verbal and written material and guidebook.  Patient learns different ways to obtain key vitamins and minerals, including through a recommended healthy diet. It is important to discuss all supplements you take with your doctor.   Healthy Mind-Set    Smoking Cessation  Clinical staff conducted group  or individual video education with verbal and written material and guidebook.  Patient learns that cigarette smoking and tobacco addiction pose a serious health risk which affects millions of people. Stopping smoking will significantly reduce the risk of heart disease, lung disease, and many forms of cancer. Recommended strategies for quitting are covered, including working with your doctor to develop a successful plan.  Culinary   Becoming a Financial trader conducted group or individual video education with verbal and written material and guidebook.  Patient learns that cooking at home can be healthy, cost-effective, quick, and puts them in control. Keys to cooking healthy recipes will include looking at your recipe, assessing your equipment needs, planning ahead, making it simple, choosing cost-effective seasonal ingredients, and limiting the use of added fats, salts, and sugars.  Cooking - Breakfast and Snacks  Clinical staff conducted group or individual video education with verbal and written material and guidebook.  Patient learns how important breakfast is to satiety and nutrition through the entire day. Recommendations include key foods to eat during breakfast to help stabilize blood sugar levels and to prevent overeating at meals later in the day. Planning ahead is also a key component.  Cooking - Human resources officer conducted group or individual video education with verbal and written material and guidebook.  Patient learns eating strategies to improve overall health, including an approach to cook more at home. Recommendations include thinking of animal protein as a side on your plate rather than center stage and focusing instead on lower calorie dense options like vegetables, fruits, whole grains, and plant-based proteins, such as beans. Making sauces in large quantities to freeze for later and leaving the skin on your vegetables are also recommended to maximize  your experience.  Cooking - Healthy Salads and Dressing Clinical staff conducted group or individual video education with verbal and written material and guidebook.  Patient learns that vegetables, fruits, whole grains, and legumes are the foundations of the [place]. Recommendations include how to incorporate each of these in flavorful and healthy salads, and how to create homemade salad dressings. Proper handling of ingredients is also covered. Cooking - Soups and [employer] - Soups and Desserts Clinical staff conducted group or individual video education with verbal and written material and guidebook.  Patient learns that Pritikin soups and desserts make for easy, nutritious, and delicious snacks and meal components that are low in sodium, fat, sugar, and calorie density, while high in vitamins, minerals, and filling fiber. Recommendations include simple and healthy ideas for soups and desserts.   Overview     The Pritikin Solution Program Overview Clinical staff conducted group or individual video education with verbal and written material and guidebook.  Patient learns that the results of the [place] Program have been documented in more than 100 articles published in peer-reviewed journals, and the benefits  include reducing risk factors for (and, in some cases, even reversing) high cholesterol, high blood pressure, type 2 diabetes, obesity, and more! An overview of the three key pillars of the Pritikin Program will be covered: eating well, doing regular exercise, and having a healthy mind-set.  WORKSHOPS  Exercise: Exercise Basics: Building Your Action Plan Clinical staff led group instruction and group discussion with PowerPoint presentation and patient guidebook. To enhance the learning environment the use of posters, models and videos may be added. At the conclusion of this workshop, patients will comprehend the difference between physical activity and exercise, as well  as the benefits of incorporating both, into their routine. Patients will understand the FITT (Frequency, Intensity, Time, and Type) principle and how to use it to build an exercise action plan. In addition, safety concerns and other considerations for exercise and cardiac rehab will be addressed by the presenter. The purpose of this lesson is to promote a comprehensive and effective weekly exercise routine in order to improve patients' overall level of fitness.   Managing Heart Disease: Your Path to a Healthier Heart Clinical staff led group instruction and group discussion with PowerPoint presentation and patient guidebook. To enhance the learning environment the use of posters, models and videos may be added.At the conclusion of this workshop, patients will understand the anatomy and physiology of the heart. Additionally, they will understand how Pritikin's three pillars impact the risk factors, the progression, and the management of heart disease.  The purpose of this lesson is to provide a high-level overview of the heart, heart disease, and how the Pritikin lifestyle positively impacts risk factors.  Exercise Biomechanics Clinical staff led group instruction and group discussion with PowerPoint presentation and patient guidebook. To enhance the learning environment the use of posters, models and videos may be added. Patients will learn how the structural parts of their bodies function and how these functions impact their daily activities, movement, and exercise. Patients will learn how to promote a neutral spine, learn how to manage pain, and identify ways to improve their physical movement in order to promote healthy living. The purpose of this lesson is to expose patients to common physical limitations that impact physical activity. Participants will learn practical ways to adapt and manage aches and pains, and to minimize their effect on regular exercise. Patients will learn how to  maintain good posture while sitting, walking, and lifting.  Balance Training and Fall Prevention  Clinical staff led group instruction and group discussion with PowerPoint presentation and patient guidebook. To enhance the learning environment the use of posters, models and videos may be added. At the conclusion of this workshop, patients will understand the importance of their sensorimotor skills (vision, proprioception, and the vestibular system) in maintaining their ability to balance as they age. Patients will apply a variety of balancing exercises that are appropriate for their current level of function. Patients will understand the common causes for poor balance, possible solutions to these problems, and ways to modify their physical environment in order to minimize their fall risk. The purpose of this lesson is to teach patients about the importance of maintaining balance as they age and ways to minimize their risk of falling.  WORKSHOPS   Nutrition:  Fueling a Scientist, research (physical sciences) led group instruction and group discussion with PowerPoint presentation and patient guidebook. To enhance the learning environment the use of posters, models and videos may be added. Patients will review the foundational principles of the [place] and understand  what constitutes a serving size in each of the food groups. Patients will also learn Pritikin-friendly foods that are better choices when away from home and review make-ahead meal and snack options. Calorie density will be reviewed and applied to three nutrition priorities: weight maintenance, weight loss, and weight gain. The purpose of this lesson is to reinforce (in a group setting) the key concepts around what patients are recommended to eat and how to apply these guidelines when away from home by planning and selecting Pritikin-friendly options. Patients will understand how calorie density may be adjusted for different weight management  goals.  Mindful Eating  Clinical staff led group instruction and group discussion with PowerPoint presentation and patient guidebook. To enhance the learning environment the use of posters, models and videos may be added. Patients will briefly review the concepts of the [place] and the importance of low-calorie dense foods. The concept of mindful eating will be introduced as well as the importance of paying attention to internal hunger signals. Triggers for non-hunger eating and techniques for dealing with triggers will be explored. The purpose of this lesson is to provide patients with the opportunity to review the basic principles of the [place], discuss the value of eating mindfully and how to measure internal cues of hunger and fullness using the Hunger Scale. Patients will also discuss reasons for non-hunger eating and learn strategies to use for controlling emotional eating.  Targeting Your Nutrition Priorities Clinical staff led group instruction and group discussion with PowerPoint presentation and patient guidebook. To enhance the learning environment the use of posters, models and videos may be added. Patients will learn how to determine their genetic susceptibility to disease by reviewing their family history. Patients will gain insight into the importance of diet as part of an overall healthy lifestyle in mitigating the impact of genetics and other environmental insults. The purpose of this lesson is to provide patients with the opportunity to assess their personal nutrition priorities by looking at their family history, their own health history and current risk factors. Patients will also be able to discuss ways of prioritizing and modifying the [place] for their highest risk areas  Menu  Clinical staff led group instruction and group discussion with PowerPoint presentation and patient guidebook. To enhance the learning environment the use of posters,  models and videos may be added. Using menus brought in from [employer], or printed from [employer], patients will apply the [place] dining out guidelines that were presented in the [employer] video. Patients will also be able to practice these guidelines in a variety of provided scenarios. The purpose of this lesson is to provide patients with the opportunity to practice hands-on learning of the [place] with actual menus and practice scenarios.  Label Reading Clinical staff led group instruction and group discussion with PowerPoint presentation and patient guidebook. To enhance the learning environment the use of posters, models and videos may be added. Patients will review and discuss the Pritikin label reading guidelines presented in Pritikin's Label Reading Educational series video. Using fool labels brought in from local grocery stores and markets, patients will apply the label reading guidelines and determine if the packaged food meet the Pritikin guidelines. The purpose of this lesson is to provide patients with the opportunity to review, discuss, and practice hands-on learning of the Pritikin Label Reading guidelines with actual packaged food labels. [place] Workshops are designed to teach  patients ways to prepare quick, simple, and affordable recipes at home. The importance of nutrition's role in chronic disease risk reduction is reflected in its emphasis in the overall Pritikin program. By learning how to prepare essential core Pritikin Eating Plan recipes, patients will increase control over what they eat; be able to customize the flavor of foods without the use of added salt, sugar, or fat; and improve the quality of the food they consume. By learning a set of core recipes which are easily assembled, quickly prepared, and affordable, patients are more likely to prepare more healthy foods at home. These workshops  focus on convenient breakfasts, simple entres, side dishes, and desserts which can be prepared with minimal effort and are consistent with nutrition recommendations for cardiovascular risk reduction. Cooking [employer] are taught by a Engineer, materials (RD) who has been trained by the [employer]. The chef or RD has a clear understanding of the importance of minimizing - if not completely eliminating - added fat, sugar, and sodium in recipes. Throughout the series of [place] Workshop sessions, patients will learn about healthy ingredients and efficient methods of cooking to build confidence in their capability to prepare    Cooking School weekly topics:  Adding Flavor- Sodium-Free  Fast and Healthy Breakfasts  Powerhouse Plant-Based Proteins  Satisfying Salads and Dressings  Simple Sides and Sauces  International Cuisine-Spotlight on the [place] Zones  Delicious Desserts  Savory Soups  Efficiency Cooking - Meals in a Snap  Tasty Appetizers and Snacks  Comforting Weekend Breakfasts  One-Pot Wonders   Fast Evening Meals  Easy [place] (Psychosocial): New Thoughts, New Behaviors Clinical staff led group instruction and group discussion with PowerPoint presentation and patient guidebook. To enhance the learning environment the use of posters, models and videos may be added. Patients will learn and practice techniques for developing effective health and lifestyle goals. Patients will be able to effectively apply the goal setting process learned to develop at least one new personal goal.  The purpose of this lesson is to expose patients to a new skill set of behavior modification techniques such as techniques setting SMART goals, overcoming barriers, and achieving new thoughts and new behaviors.  Managing Moods and Relationships Clinical staff led group instruction and group discussion with  PowerPoint presentation and patient guidebook. To enhance the learning environment the use of posters, models and videos may be added. Patients will learn how emotional and chronic stress factors can impact their health and relationships. They will learn healthy ways to manage their moods and utilize positive coping mechanisms. In addition, ICR patients will learn ways to improve communication skills. The purpose of this lesson is to expose patients to ways of understanding how one's mood and health are intimately connected. Developing a healthy outlook can help build positive relationships and connections with others. Patients will understand the importance of utilizing effective communication skills that include actively listening and being heard. They will learn and understand the importance of the "4 Cs" and especially Connections in fostering of a Healthy Mind-Set.  Healthy Sleep for a Healthy Heart Clinical staff led group instruction and group discussion with PowerPoint presentation and patient guidebook. To enhance the learning environment the use of posters, models and videos may be added. At the conclusion of this workshop, patients will be able to demonstrate knowledge of the importance of sleep to overall health, well-being, and quality of life. They will  understand the symptoms of, and treatments for, common sleep disorders. Patients will also be able to identify daytime and nighttime behaviors which impact sleep, and they will be able to apply these tools to help manage sleep-related challenges. The purpose of this lesson is to provide patients with a general overview of sleep and outline the importance of quality sleep. Patients will learn about a few of the most common sleep disorders. Patients will also be introduced to the concept of "sleep hygiene," and discover ways to self-manage certain sleeping problems through simple daily behavior changes. Finally, the workshop will motivate patients by  clarifying the links between quality sleep and their goals of heart-healthy living.   Recognizing and Reducing Stress Clinical staff led group instruction and group discussion with PowerPoint presentation and patient guidebook. To enhance the learning environment the use of posters, models and videos may be added. At the conclusion of this workshop, patients will be able to understand the types of stress reactions, differentiate between acute and chronic stress, and recognize the impact that chronic stress has on their health. They will also be able to apply different coping mechanisms, such as reframing negative self-talk. Patients will have the opportunity to practice a variety of stress management techniques, such as deep abdominal breathing, progressive muscle relaxation, and/or guided imagery.  The purpose of this lesson is to educate patients on the role of stress in their lives and to provide healthy techniques for coping with it.  Learning Barriers/Preferences:  Learning Barriers/Preferences - 11/13/21 1212       Learning Barriers/Preferences   Learning Barriers None    Learning Preferences Skilled Demonstration;Pictoral;Verbal Instruction;Individual Instruction;Written Material             Education Topics:  Knowledge Questionnaire Score:  Knowledge Questionnaire Score - 11/13/21 1213       Knowledge Questionnaire Score   Pre Score 18/24             Core Components/Risk Factors/Patient Goals at Admission:  Personal Goals and Risk Factors at Admission - 11/13/21 1213       Core Components/Risk Factors/Patient Goals on Admission    Weight Management Weight Maintenance;Yes    Intervention Weight Management: Provide education and appropriate resources to help participant work on and attain dietary goals.;Weight Management: Develop a combined nutrition and exercise program designed to reach desired caloric intake, while maintaining appropriate intake of nutrient and fiber,  sodium and fats, and appropriate energy expenditure required for the weight goal.    Expected Outcomes Weight Maintenance: Understanding of the daily nutrition guidelines, which includes 25-35% calories from fat, 7% or less cal from saturated fats, less than 22m cholesterol, less than 1.5gm of sodium, & 5 or more servings of fruits and vegetables daily;Understanding recommendations for meals to include 15-35% energy as protein, 25-35% energy from fat, 35-60% energy from carbohydrates, less than 2044mof dietary cholesterol, 20-35 gm of total fiber daily    Diabetes Yes    Intervention Provide education about proper nutrition, including hydration, and aerobic/resistive exercise prescription along with prescribed medications to achieve blood glucose in normal ranges: Fasting glucose 65-99 mg/dL;Provide education about signs/symptoms and action to take for hypo/hyperglycemia.    Expected Outcomes Long Term: Attainment of HbA1C < 7%.;Short Term: Participant verbalizes understanding of the signs/symptoms and immediate care of hyper/hypoglycemia, proper foot care and importance of medication, aerobic/resistive exercise and nutrition plan for blood glucose control.    Heart Failure Yes    Intervention Provide a combined exercise and nutrition program  that is supplemented with education, support and counseling about heart failure. Directed toward relieving symptoms such as shortness of breath, decreased exercise tolerance, and extremity edema.    Expected Outcomes Improve functional capacity of life;Short term: Attendance in program 2-3 days a week with increased exercise capacity. Reported lower sodium intake. Reported increased fruit and vegetable intake. Reports medication compliance.;Short term: Daily weights obtained and reported for increase. Utilizing diuretic protocols set by physician.;Long term: Adoption of self-care skills and reduction of barriers for early signs and symptoms recognition and  intervention leading to self-care maintenance.    Hypertension Yes    Intervention Provide education on lifestyle modifcations including regular physical activity/exercise, weight management, moderate sodium restriction and increased consumption of fresh fruit, vegetables, and low fat dairy, alcohol moderation, and smoking cessation.;Monitor prescription use compliance.    Expected Outcomes Short Term: Continued assessment and intervention until BP is < 140/58m HG in hypertensive participants. < 130/887mHG in hypertensive participants with diabetes, heart failure or chronic kidney disease.;Long Term: Maintenance of blood pressure at goal levels.    Lipids Yes    Intervention Provide education and support for participant on nutrition & aerobic/resistive exercise along with prescribed medications to achieve LDL <7030mHDL >20m31m  Expected Outcomes Short Term: Participant states understanding of desired cholesterol values and is compliant with medications prescribed. Participant is following exercise prescription and nutrition guidelines.;Long Term: Cholesterol controlled with medications as prescribed, with individualized exercise RX and with personalized nutrition plan. Value goals: LDL < 70mg61mL > 40 mg.    Stress Yes    Intervention Offer individual and/or small group education and counseling on adjustment to heart disease, stress management and health-related lifestyle change. Teach and support self-help strategies.    Expected Outcomes Short Term: Participant demonstrates changes in health-related behavior, relaxation and other stress management skills, ability to obtain effective social support, and compliance with psychotropic medications if prescribed.;Long Term: Emotional wellbeing is indicated by absence of clinically significant psychosocial distress or social isolation.             Core Components/Risk Factors/Patient Goals Review:   Goals and Risk Factor Review     Row Name  12/05/21 0746 01/01/22 0901           Core Components/Risk Factors/Patient Goals Review   Personal Goals Review Weight Management/Obesity;Diabetes;Heart Failure;Hypertension;Lipids;Stress Weight Management/Obesity;Diabetes;Heart Failure;Hypertension;Lipids;Stress      Review GeralBurnhamff to a good start and has completed 7 exercise session in traditional cardiac rehab.  GeralRanferiis deconditioned from chronic nature of his disease process and medical complicated recovery is slowly making progress.  GeralWendalcomplete neurological  follow up and was assured . GeralRentongained 2 kg although GeralAbranred to maintain his weight his BMI rflects normal weight. He has noticed an increase in his appetite now that he is exercising.  GeralJollyn optimal heart failure regimen and is compliant and attends follow up appts in the heart failure clinic.  Vital signs and blood glucose readings are within normal limits.  GeralFlorentinonds the educational classes regarding stress as able and feels the relaxation techniques are beneficial. [place] and now [place] himself to Cardiac rehab. Met level continues to increase as the workload for the stepper increases to 20 minutes on level 2 and 10 minutes on level 3 with the plan to continue to increase the duration of time with the higher workload.  Ares reports within normal limits cbg with the CGM.  Tay continue on optimal heart failure regimen and is compliant with medication and follow up appts.  Vital signs are reassuring.  Nolawi reports decrease in his stress as he is growing more confident in his abilities.  Marginal weight gain continue to work with RD for progress toward the desired weight gain goal.      Expected Outcomes Elizer will adopt a heart healthy lifestyle in accordance to the tree pillars learned from the [facility] cardiac rehab: hear healthy nutrition and healthy  mindset. Davied will adopt a heart healthy lifestyle in accordance to the tree pillars learned from the [facility] cardiac rehab: hear healthy nutrition and healthy mindset.               Core Components/Risk Factors/Patient Goals at Discharge (Final Review):   Goals and Risk Factor Review - 01/01/22 0901       Core Components/Risk Factors/Patient Goals Review   Personal Goals Review Weight Management/Obesity;Diabetes;Heart Failure;Hypertension;Lipids;Stress    Review Alfonza has completed 15 exercise sessions in [place] is making progress and now drives himself to Cardiac rehab. Met level continues to increase as the workload for the stepper increases to 20 minutes on level 2 and 10 minutes on level 3 with the plan to continue to increase the duration of time with the higher workload. Latoya reports within normal limits cbg with the CGM.  Cordney continue on optimal heart failure regimen and is compliant with medication and follow up appts.  Vital signs are reassuring.  Oshen reports decrease in his stress as he is growing more confident in his abilities.  Marginal weight gain continue to work with RD for progress toward the desired weight gain goal.    Expected Outcomes Praveen will adopt a heart healthy lifestyle in accordance to the tree pillars learned from the [facility] cardiac rehab: hear healthy nutrition and healthy mindset.             ITP Comments:  ITP Comments     Row Name 11/13/21 0914 12/05/21 0844         ITP Comments Dr. Fransico Him Medical Director, Introduction to Pritikin education program / intensive cardiac rehab, intial Pritikin orientation packet reviewed with patient. 30 day ITP Review. Neel has good attendance and participation in Intensive Cardiac Rehab. He is offf to a wonderful start.               Comments: Johari who is enrolled in [place] is making expected progress toward personal goals after completing   15 sessions. Recommend continued exercise and life style modification education including  stress management and relaxation techniques to decrease cardiac risk profile. Cherre Huger, BSN Cardiac and Training and development officer

## 2022-01-02 ENCOUNTER — Encounter (HOSPITAL_COMMUNITY): Payer: Medicaid Other

## 2022-01-02 ENCOUNTER — Telehealth: Payer: Self-pay | Admitting: Licensed Clinical Social Worker

## 2022-01-02 ENCOUNTER — Encounter (HOSPITAL_COMMUNITY)
Admission: RE | Admit: 2022-01-02 | Discharge: 2022-01-02 | Disposition: A | Discharge status: Home / Self Care | Payer: Medicaid Other | Source: Ambulatory Visit | Attending: Cardiology | Admitting: Cardiology

## 2022-01-02 DIAGNOSIS — I5022 Chronic systolic (congestive) heart failure: Secondary | ICD-10-CM

## 2022-01-02 NOTE — Telephone Encounter (Signed)
H&V Care Navigation CSW Progress Note  Clinical Social Worker contacted patient by phone to provide him with the number for [facility] to schedule an appt [phone]). Pt agreeable to this being texted to him at [phone]. Encouraged him to reach out to me if any additional questions/concerns.  Patient is participating in a Managed Medicaid Plan:  No, Medicaid [place]: No Food Insecurity (09/14/2020)  Recent Concern: [place] Present (09/13/2020)  Housing: Low Risk  (12/15/2020)  Transportation Needs: Unmet Transportation Needs (12/15/2020)  Utilities: At Risk (12/18/2021)  Depression (PHQ2-9): Medium Risk (11/13/2021)  Financial Resource Strain: High Risk (10/18/2021)  Tobacco Use: Low Risk  (12/05/2021)   Westley Hummer, MSW, [place]  [phone]- work cell phone (preferred) [phone]- desk phone

## 2022-01-04 ENCOUNTER — Encounter (HOSPITAL_COMMUNITY)
Admission: RE | Admit: 2022-01-04 | Discharge: 2022-01-04 | Disposition: A | Discharge status: Home / Self Care | Payer: Medicaid Other | Source: Ambulatory Visit | Attending: Cardiology | Admitting: Cardiology

## 2022-01-04 ENCOUNTER — Encounter (HOSPITAL_COMMUNITY): Payer: Medicaid Other

## 2022-01-04 DIAGNOSIS — I5022 Chronic systolic (congestive) heart failure: Secondary | ICD-10-CM | POA: Diagnosis not present

## 2022-01-07 ENCOUNTER — Encounter (HOSPITAL_COMMUNITY)
Admission: RE | Admit: 2022-01-07 | Discharge: 2022-01-07 | Disposition: A | Discharge status: Home / Self Care | Payer: Medicaid Other | Source: Ambulatory Visit | Attending: Cardiology | Admitting: Cardiology

## 2022-01-07 ENCOUNTER — Encounter (HOSPITAL_COMMUNITY): Payer: Medicaid Other

## 2022-01-07 DIAGNOSIS — I5022 Chronic systolic (congestive) heart failure: Secondary | ICD-10-CM | POA: Diagnosis not present

## 2022-01-09 ENCOUNTER — Encounter (HOSPITAL_COMMUNITY): Payer: Medicaid Other

## 2022-01-09 ENCOUNTER — Encounter (HOSPITAL_COMMUNITY)
Admission: RE | Admit: 2022-01-09 | Discharge: 2022-01-09 | Disposition: A | Discharge status: Home / Self Care | Payer: Medicaid Other | Source: Ambulatory Visit | Attending: Cardiology | Admitting: Cardiology

## 2022-01-09 DIAGNOSIS — I5022 Chronic systolic (congestive) heart failure: Secondary | ICD-10-CM

## 2022-01-11 ENCOUNTER — Other Ambulatory Visit: Payer: Self-pay | Admitting: Internal Medicine

## 2022-01-11 ENCOUNTER — Encounter (HOSPITAL_COMMUNITY): Payer: Medicaid Other

## 2022-01-11 ENCOUNTER — Other Ambulatory Visit (HOSPITAL_COMMUNITY): Payer: Self-pay | Admitting: Cardiology

## 2022-01-11 ENCOUNTER — Other Ambulatory Visit: Payer: Self-pay | Admitting: Physical Medicine & Rehabilitation

## 2022-01-11 ENCOUNTER — Encounter (HOSPITAL_COMMUNITY)
Admission: RE | Admit: 2022-01-11 | Discharge: 2022-01-11 | Disposition: A | Discharge status: Home / Self Care | Payer: Medicaid Other | Source: Ambulatory Visit | Attending: Cardiology | Admitting: Cardiology

## 2022-01-11 DIAGNOSIS — Z5181 Encounter for therapeutic drug level monitoring: Secondary | ICD-10-CM

## 2022-01-11 DIAGNOSIS — E1142 Type 2 diabetes mellitus with diabetic polyneuropathy: Secondary | ICD-10-CM

## 2022-01-11 DIAGNOSIS — Z7901 Long term (current) use of anticoagulants: Secondary | ICD-10-CM

## 2022-01-11 DIAGNOSIS — I5022 Chronic systolic (congestive) heart failure: Secondary | ICD-10-CM

## 2022-01-11 NOTE — Telephone Encounter (Signed)
Requested Prescriptions  Pending Prescriptions Disp Refills  . atorvastatin (LIPITOR) 80 MG tablet [Pharmacy Med Name: ATORVASTATIN CALCIUM 80 MG ORAL TABLET] 90 tablet 0    Sig: TAKE 1 TABLET (80 MG TOTAL) BY MOUTH DAILY (EVENING)     Cardiovascular:  Antilipid - Statins Failed - 01/11/2022 10:20 AM      Failed - Lipid Panel in normal range within the last 12 months    Cholesterol, Total  Date Value Ref Range Status  06/08/2021 107 100 - 199 mg/dL Final   Cholesterol  Date Value Ref Range Status  09/21/2021 133 0 - 200 mg/dL Final   LDL Chol Calc (NIH)  Date Value Ref Range Status  06/08/2021 31 0 - 99 mg/dL Final   LDL Cholesterol  Date Value Ref Range Status  09/21/2021 56 0 - 99 mg/dL Final    Comment:           Total Cholesterol/HDL:CHD Risk Coronary Heart Disease Risk Table                     Men   Women  1/2 Average Risk   3.4   3.3  Average Risk       5.0   4.4  2 X Average Risk   9.6   7.1  3 X Average Risk  23.4   11.0        Use the calculated Patient Ratio above and the CHD Risk Table to determine the patient's CHD Risk.        ATP III CLASSIFICATION (LDL):  <100     mg/dL   Optimal  878-676  mg/dL   Near or Above                    Optimal  130-159  mg/dL   Borderline  720-947  mg/dL   High  >096     mg/dL   Very High Performed at [facility], [address], [place], [place] [address]    HDL  Date Value Ref Range Status  09/21/2021 47 >40 mg/dL Final  29/47/6546 49 >50 mg/dL Final   Triglycerides  Date Value Ref Range Status  09/21/2021 149 <150 mg/dL Final         Passed - Patient is not pregnant      Passed - Valid encounter within last 12 months    Recent Outpatient Visits          9 months ago Type 2 diabetes mellitus with diabetic polyneuropathy, with long-term current use of insulin (HCC)   [place] [facility] [place], Jeannett Senior L, RPH-CPP   10 months ago Type 2 diabetes mellitus with diabetic  polyneuropathy, with long-term current use of insulin [facility])   [place] [facility] [place], Jeannett Senior L, RPH-CPP   11 months ago Type 2 diabetes mellitus with diabetic polyneuropathy, with long-term current use of insulin (HCC)   [place] [facility] [name] B, MD   1 year ago Type 2 diabetes mellitus with diabetic polyneuropathy, with long-term current use of insulin (HCC)   [place] [facility], Gavin Pound B, MD   2 years ago Type 2 diabetes mellitus with diabetic polyneuropathy, with long-term current use of insulin [facility])   [place] [facility] Marcine Matar, MD      Future Appointments  In 2 weeks Ladell Pier, MD [place]   In 5 months Penumalli, Earlean Polka, MD [facility]

## 2022-01-14 ENCOUNTER — Other Ambulatory Visit (HOSPITAL_COMMUNITY): Payer: Self-pay

## 2022-01-14 ENCOUNTER — Encounter: Payer: Self-pay | Admitting: Internal Medicine

## 2022-01-14 ENCOUNTER — Encounter (HOSPITAL_COMMUNITY)
Admission: RE | Admit: 2022-01-14 | Discharge: 2022-01-14 | Disposition: A | Discharge status: Home / Self Care | Payer: Medicaid Other | Source: Ambulatory Visit | Attending: Cardiology | Admitting: Cardiology

## 2022-01-14 ENCOUNTER — Encounter (HOSPITAL_COMMUNITY): Payer: Medicaid Other

## 2022-01-14 VITALS — Ht 72.0 in | Wt 160.1 lb

## 2022-01-14 DIAGNOSIS — I5022 Chronic systolic (congestive) heart failure: Secondary | ICD-10-CM | POA: Insufficient documentation

## 2022-01-14 DIAGNOSIS — Z5189 Encounter for other specified aftercare: Secondary | ICD-10-CM | POA: Diagnosis not present

## 2022-01-14 NOTE — Progress Notes (Signed)
Paramedicine Encounter    Patient ID: Jason Stevenson, male    DOB: 10/29/1985, 36 y.o.   MRN: [id number]  Arrived for home visit for Mell who reports feeling well today. He says he completed cardiac rehab this morning and has his final graduation visit with them on Friday. Today is our final paramedicine visit as I am graduating Jason Stevenson as well. He has successfully completed all goals and is comfortable with graduating from program. He will still be followed within the HF clinic. Today he reports no complaints. He denied dizziness, chest pain, shortness of breath, swelling. He reports that he is moving quicker and feels stronger. I see positive results from him being engaged in cardiac rehab. He has enjoyed it and feels better.   I obtained vitals: WT- 160lbs BP- 110/60 HR- 76 O2- 98% RR- 16 CBG- 94  (he replaced his freestyle today)  No lower leg edema Lungs clear on assessment.  Jason Stevenson gets all meds in bubble packs from [employer] and knows how to get refills.  He has access to transportation- his wife has a vehicle.  He is aware of signs and symptoms of his HF and how to manage same and how to reach out to specific resources if needed. He has medicaid and access to all healthcare providers and uses mychart on his phone.   We reviewed upcoming appointments and he confirmed same. He plans to reach out to [facility] this week to schedule his cath since he is finishing cardiac rehab. They are aware of this also as I spoke to Care Navigator at [facility] a few weeks ago.   Jason Stevenson agreed with graduation from paramedicine and home visit is complete. He and his wife knows to reach out to the HF clinic if needed and they know if they need me they can still reach me by phone.   Patient is now discharged from [facility].  Patient has/has not met the following goals:  Yes :Patient expresses basic understanding of medications and what they are for Yes :Patient able to  verbalize heart failure specific dietary/fluid restrictions Yes :Patient is aware of who to call if they have medical concerns or if they need to schedule or change appts Yes :Patient has a scale for daily weights and weighs regularly Yes :Patient able to verbalize concerning symptoms when they should call the HF clinic (weight gain ranges, etc) Yes :Patient has a PCP and has seen within the past year or has upcoming appt Yes :Patient has reliable access to getting their medications Yes :Patient has shown they are able to reorder medications reliably No :Patient has had admission in past 30 days- if yes how many? No :Patient has had admission in past 90 days- if yes how many?  Discharge Comments:         Patient Care Team: Ladell Pier, MD as PCP - General (Internal Medicine) Josue Hector, MD as PCP - Cardiology (Cardiology) Vickie Epley, MD as PCP - Electrophysiology (Cardiology)  Patient Active Problem List   Diagnosis Date Noted   Nonischemic cardiomyopathy [facility]) 08/27/2021   LADA (latent autoimmune diabetes in adults), managed as type 2 ([place]) 03/28/2021   Poorly controlled type 2 diabetes mellitus with circulatory disorder ([place]) 03/19/2021   Ischemic cardiomyopathy    Syncope 12/14/2020   Chest pain 10/30/2020   History of cerebrovascular accident (CVA) with residual deficit 10/27/2020   Protein-calorie malnutrition, severe 09/22/2020   Acute CVA (cerebrovascular accident) ([place]) 09/21/2020   Near  syncope 09/21/2020   Encounter for monitoring Coumadin therapy 09/20/2020   LV (left ventricular) mural thrombus 09/13/2020   Seizure ([place]) 09/13/2020   Neurological deficit present 09/12/2020   History of COVID-19 09/12/2020   Intracerebral hemorrhage 09/12/2020   Acute cerebrovascular accident (CVA) ([place]) 09/12/2020   COVID-19 08/23/2020   Noncompliance with medication treatment due to intermittent use of medication 10/12/2018   Dyslipidemia 04/17/2016    Chronic systolic CHF (congestive heart failure) ([place]) 06/18/2015   Needs flu shot 12/23/2013   Non-ischemic cardiomyopathy ([place]) 11/06/2012   HTN (hypertension) 11/06/2012   HLD (hyperlipidemia) 11/06/2012    Current Outpatient Medications:    Accu-Chek Softclix Lancets lancets, Use to check blood sugar three times daily., Disp: 100 each, Rfl: 3   acetaminophen (TYLENOL) 325 MG tablet, Take 1-2 tablets (325-650 mg total) by mouth every 4 (four) hours as needed for mild pain., Disp: , Rfl:    atorvastatin (LIPITOR) 80 MG tablet, TAKE 1 TABLET (80 MG TOTAL) BY MOUTH DAILY (EVENING), Disp: 90 tablet, Rfl: 0   Blood Glucose Monitoring Suppl (ACCU-CHEK GUIDE) w/Device KIT, Use to check blood sugar three times daily., Disp: 1 kit, Rfl: 0   Blood Pressure Monitor DEVI, Use as directed to check home blood pressure 2-3 times a week, Disp: 1 Device, Rfl: 0   Continuous Blood Gluc Receiver (FREESTYLE LIBRE 2 READER) DEVI, 1 each by Does not apply route daily., Disp: 1 each, Rfl: 0   Continuous Blood Gluc Sensor (FREESTYLE LIBRE 2 SENSOR) MISC, USE EVERY 14 (FOURTEEN) DAYS., Disp: 2 each, Rfl: 6   dapagliflozin propanediol (FARXIGA) 5 MG TABS tablet, Take 1 tablet (5 mg total) by mouth daily before breakfast., Disp: 30 tablet, Rfl: 6   digoxin (LANOXIN) 0.125 MG tablet, Take 1 tablet (0.125 mg total) by mouth daily. Please request Cardiologist for future refills., Disp: 30 tablet, Rfl: 6   DULoxetine (CYMBALTA) 30 MG capsule, TAKE 1 CAPSULE (30 MG TOTAL) BY MOUTH DAILY., Disp: 30 capsule, Rfl: 2   ENTRESTO 49-51 MG, TAKE 1 TABLET BY MOUTH 2 (TWO) TIMES DAILY (AM+EVENING), Disp: 60 tablet, Rfl: 3   Evolocumab (REPATHA SURECLICK) 233 MG/ML SOAJ, Inject 140 mg into the skin every 14 (fourteen) days., Disp: 2 mL, Rfl: 11   ezetimibe (ZETIA) 10 MG tablet, Take 1 tablet (10 mg total) by mouth daily., Disp: 30 tablet, Rfl: 3   furosemide (LASIX) 20 MG tablet, Take 1 tablet (20 mg total) by mouth 3 (three) times a  week. Mondays Wednesdays and Friday, Disp: 30 tablet, Rfl: 11   glucose blood (ACCU-CHEK GUIDE) test strip, Use to check blood sugar three times daily., Disp: 100 each, Rfl: 2   insulin aspart (FIASP FLEXTOUCH) 100 UNIT/ML FlexTouch Pen, Inject 5-8 Units into the skin 3 (three) times daily before meals., Disp: 30 mL, Rfl: 3   insulin degludec (TRESIBA FLEXTOUCH) 200 UNIT/ML FlexTouch Pen, Inject 20 Units into the skin daily., Disp: 30 mL, Rfl: 3   Insulin Pen Needle 32G X 4 MM MISC, Use to inject insulin., Disp: 200 each, Rfl: 2   Insulin Syringe-Needle U-100 (RELION INSULIN SYRINGE) 31G X 15/64" 0.3 ML MISC, Use to inject insulin daily., Disp: 100 each, Rfl: 11   ivabradine (CORLANOR) 5 MG TABS tablet, Take 1 tablet (5 mg total) by mouth 2 (two) times daily with a meal., Disp: 60 tablet, Rfl: 6   levETIRAcetam (KEPPRA) 750 MG tablet, Take 2 tablets (1,500 mg total) by mouth 2 (two) times daily., Disp: 360 tablet, Rfl:  4   Menthol, Topical Analgesic, (ICY HOT BACK EX), Apply 1 application topically as needed (neuropathy). Icy hot cream, Disp: , Rfl:    metFORMIN (GLUCOPHAGE-XR) 500 MG 24 hr tablet, Take 1 tablet (500 mg total) by mouth 2 (two) times daily with a meal., Disp: 180 tablet, Rfl: 3   metoprolol (TOPROL-XL) 200 MG 24 hr tablet, TAKE 0.5 TABLETS (100 MG TOTAL) BY MOUTH DAILY. TAKE WITH OR IMMEDIATELY FOLLOWING A MEAL (EVENING), Disp: 15 tablet, Rfl: 6   Oxcarbazepine (TRILEPTAL) 300 MG tablet, Take 2 tablets (600 mg total) by mouth 2 (two) times daily., Disp: 360 tablet, Rfl: 4   phenytoin (DILANTIN) 100 MG ER capsule, Take 1 capsule (100 mg total) by mouth 3 (three) times daily., Disp: 270 capsule, Rfl: 4   potassium chloride SA (KLOR-CON M) 20 MEQ tablet, Take 1 tablet (20 mEq total) by mouth 3 (three) times a week. Take every Mon, Wed and Fri with Lasix, Disp: 30 tablet, Rfl: 4   pregabalin (LYRICA) 300 MG capsule, TAKE 1 CAPSULE (300 MG TOTAL) BY MOUTH AT BEDTIME., Disp: 30 capsule, Rfl:  2   spironolactone (ALDACTONE) 25 MG tablet, Take 1 tablet (25 mg total) by mouth at bedtime., Disp: 90 tablet, Rfl: 3   warfarin (COUMADIN) 5 MG tablet, Take 1-1.5 tablets (5-7.5 mg total) by mouth daily., Disp: 40 tablet, Rfl: 3 No Known Allergies   Social History   Socioeconomic History   Marital status: Married    Spouse name: [name]   Number of children: 0   Years of education: 13   Highest education level: Some college, no degree  Occupational History   Occupation: unemployed  Tobacco Use   Smoking status: Never   Smokeless tobacco: Never  Vaping Use   Vaping Use: Not on file  Substance and Sexual Activity   Alcohol use: Not Currently    Comment: "occasional" when "hanging out with the wrong people" No recent use.   Drug use: Not Currently    Types: Marijuana    Comment: occasional, last 2013   Sexual activity: Not Currently  Other Topics Concern   Not on file  Social History Narrative   Pt lives at home with his wife    Right handed    Caffeine- hardly any   Social Determinants of Health   Financial Resource Strain: High Risk (10/18/2021)   Overall Financial Resource Strain (CARDIA)    Difficulty of Paying Living Expenses: Hard  Food Insecurity: No Food Insecurity (09/14/2020)   Hunger Vital Sign    Worried About Running Out of Food in the Last Year: Never true    Ran Out of Food in the Last Year: Never true  Recent Concern: Food Insecurity - Food Insecurity Present (09/13/2020)   Hunger Vital Sign    Worried About Running Out of Food in the Last Year: Never true    Ran Out of Food in the Last Year: Sometimes true  Transportation Needs: Unmet Transportation Needs (12/15/2020)   PRAPARE - Hydrologist (Medical): No    Lack of Transportation (Non-Medical): Yes  Physical Activity: Not on file  Stress: Not on file  Social Connections: Not on file  Intimate Partner Violence: Not on file    Physical Exam      Future Appointments   Date Time Provider [place]  01/16/2022  6:45 AM [facility] PHASE II EXC [facility] None  01/17/2022 12:00 PM [facility] PA/NP [facility] None  01/18/2022  6:45 AM [facility]  PHASE II EXC [facility] None  01/21/2022  9:30 AM [facility] None  01/31/2022  2:50 PM Ladell Pier, MD [facility] None  02/28/2022  7:00 AM [facility] DEVICE REMOTES [facility]  03/18/2022  8:30 AM [facility] LAB [facility] None  03/21/2022  8:40 AM Philemon Kingdom, MD [facility] None  03/22/2022  8:40 AM Philemon Kingdom, MD [facility] None  05/30/2022  7:00 AM [facility] DEVICE REMOTES [facility]  07/01/2022  1:30 PM Penumalli, Earlean Polka, MD [facility] None  08/29/2022  7:00 AM [facility] DEVICE REMOTES [facility]  11/28/2022  7:00 AM [facility] DEVICE REMOTES [facility]     ACTION: Home visit completed

## 2022-01-14 NOTE — Telephone Encounter (Signed)
Refill request for warfarin:  Last INR was 2.6 on 12/10/21 Next INR due on 01/21/22 LOV was 12/05/21  Elliot Cousin MD  Refill approved.

## 2022-01-16 ENCOUNTER — Encounter (HOSPITAL_COMMUNITY)
Admission: RE | Admit: 2022-01-16 | Discharge: 2022-01-16 | Disposition: A | Discharge status: Home / Self Care | Payer: Medicaid Other | Source: Ambulatory Visit | Attending: Cardiology | Admitting: Cardiology

## 2022-01-16 ENCOUNTER — Encounter (HOSPITAL_COMMUNITY): Payer: Medicaid Other

## 2022-01-16 DIAGNOSIS — I5022 Chronic systolic (congestive) heart failure: Secondary | ICD-10-CM | POA: Diagnosis not present

## 2022-01-17 ENCOUNTER — Encounter (HOSPITAL_COMMUNITY): Payer: Self-pay

## 2022-01-17 ENCOUNTER — Ambulatory Visit (HOSPITAL_COMMUNITY)
Admission: RE | Admit: 2022-01-17 | Discharge: 2022-01-17 | Disposition: A | Discharge status: Home / Self Care | Payer: Medicaid Other | Source: Ambulatory Visit | Attending: Cardiology | Admitting: Cardiology

## 2022-01-17 ENCOUNTER — Encounter (HOSPITAL_COMMUNITY): Payer: Self-pay | Admitting: Cardiology

## 2022-01-17 VITALS — BP 101/74 | HR 62 | Wt 157.4 lb

## 2022-01-17 DIAGNOSIS — I5022 Chronic systolic (congestive) heart failure: Secondary | ICD-10-CM | POA: Diagnosis not present

## 2022-01-17 NOTE — Progress Notes (Addendum)
Heart Failure TeleHealth Note  Due to national recommendations of social distancing due to [place] 19, Audio/video telehealth visit is felt to be most appropriate for this patient at this time.  See MyChart message from today for patient consent regarding telehealth for [facility].  Date:  01/17/2022   ID:  Jason Stevenson, DOB 02-Jun-1985, MRN [id number]  Location: Home  Provider location: [facility] Type of Visit: Established patient   PCP:  Ladell Pier, MD  Cardiologist:  Jenkins Rouge, MD Primary HF: Dr. Aundra Dubin   Chief Complaint:  Chief Complaint  Patient presents with   Follow-up      History of Present Illness: Jason Stevenson is a 36 y.o. male with a history of chronic systolic heart failure.   He presents via Engineer, civil (consulting) for a telehealth visit today.     He denies symptoms worrisome for COVID 49.   36 y.o.male with diabetes, HTN, and a long history of nonischemic cardiomyopathy. He had a cardiac MRI in 1/08 showing low EF, but he says that he had been told about "heart problems" even prior to that.  He does not have a family history of cardiomyopathy that he knows of, but does not know his father's family.  Coronary CT angiogram in 2014 showed no CAD. Echo in 2/17 showed EF 20-25%, similar to 2014. Had repeat echo in 2020 showing LVEF 15-20%, RV normal. He had COVID-19 in 4/22.    He presented to [facility] ED 09/11/20 with tingling in left arm, left face, drooling and blank stare for about 1 week, and admitted for possible seizure, HHS and possible CVA.  In ED, he had left-sided neurologic deficit with dysarthria.  CT head with probable subacute infarct in right frontal lobe and old right parietal infarct.  Reportedly, patient's neurologic deficits resolved in ED, and focus shifted to possible seizure versus stroke.  He was loaded with Keppra.  MRI brain with probable hemorrhagic infarct involving the anterior right frontal lobe and left  parietal occipital cortex with additional chronic right parietal infarct, right frontotemporal region enhancement suggesting changes related to acute seizure, possible toxic insult involving the caudate and lentiform nuclei bilaterally.  Neurology recommended interval follow-up brain MRI.  CTA head and neck without significant finding.  TTE with EF of 10-15%, LV thrombus, moderate RV dysfunction and dilated IVC.  He was discharged on GDMT, but A1c too high for SGLT2i.     Seen in [facility] ED 09/21/20 for fall after syncopal episode on toilet, hitting his head. CT of the head was significant for no bleeding but new low-density left cerebellum suggesting acute infarct. EEG unremarkable and felt fall secondary to vagal episode not seizure.   He had atypical chest pain at his hospital follow up. With his history of HLD and DM, decisions was made to undergo [facility] to evaluate coronary anatomy and hemodynamics.  R/LHC (7/22) showed normal filling pressures, preserved cardiac output and no significant CAD. RA mean 1, RV 28/1, PA 19/7 mean 12, PCWP 3, CO/CI 5.54/2.92.   Again admitted 12/14/20-12/16/20 for syncopal episode. CT head negative. He was given IVF, beta blocker/spiro/Entresto held initially. Suspected orthostatic vs micturition syncope. EP consulted with known low EF and plans for ICD implant when a1c better controlled.   CPX in 9/22 showed severe HF limitation. Echo 1-/23 showed EF < 20% with mild LV dilation, no LV thrombus noted, normal RV size with mildly decreased systolic function, IVC normal.    Followup 11/22, weight  up about 10 lbs.  Patient had a seizure thought to be related to hypoglycemia (paramedics called, improved with glucose). Lasix 20 mg started every other day, eventually increased to daily.    [place] 3/23 at [facility] as part of pre-transplant work up showed normal pressures, RA 7, RV 23/4, PA 14, PCWP 11, CI 3.5.  Patient is currently being worked up for transplant listing.  Needs to do cardiac rehab  and get Hgb A1c under better control.    Medtronic ICD placed 5/23.    Follow up 6/23, chronic NYHA III symptoms and volume stable. Ongoing work up at [facility] for transplant. Entresto increased to 49/51 and daily Lasix stopped.   Was scheduled for in person visit but had car trouble. Switched to Virtual visit arranged today. Reports doing fairly well. NYHA Class II-early III. Reports daily wts at home and when checked at cardiac rehab have been stable. Reports full med compliance. He checked BP at home this morning, BP was soft 101/74 which is c/w his baseline. Tolerates this ok. No syncope/ near syncope. Reports stable 2 pillow orthopnea. No PND. No LEE. Denies ICD shocks. No seizure like activity.   Pt sent remote device interrogation today. I have personally reviewed report, Volume status good, fluid index < threshold, activity level ~1h/day. No AF. No VT/VF.   Still enrolled in CR. Ongoing work up at [facility] for transplant.  Says his DM has been under better control.    Labs (9/22): K 3.5, creatinine 0.85, digoxin 0.4 Labs (10/22): K 4.1, creatinine 0.8, digoxin 0.9 Labs (3/23): K 4.5, creatinine 0.9 Labs (4/23): K 4.6, creatinine 0.89 Labs (6/23): K 4.4, creatinine 0.77 Labs (8/23): K 4.5, Scr 0.84, BNP 36, digoxin 0.5     Past Medical History:  Diagnosis Date   Chronic systolic CHF (congestive heart failure) ([place]) 06/18/2015   Diabetes mellitus without complication ([place])    Hemorrhagic stroke ([place])    Hyperlipidemia    Hypertension    Nonischemic cardiomyopathy ([place]) Noted as early as 2007   Per chart review (cards consult note 2011), EF of 40% in 2007, down to 20-25% in 2011   Past Surgical History:  Procedure Laterality Date   ICD IMPLANT N/A 08/27/2021   Procedure: ICD IMPLANT;  Surgeon: Vickie Epley, MD;  Location: [place] [facility];  Service: Cardiovascular;  Laterality: N/A;   None     RIGHT HEART CATH  06/21/2021   [facility]   RIGHT/LEFT HEART CATH AND  CORONARY ANGIOGRAPHY N/A 11/02/2020   Procedure: RIGHT/LEFT HEART CATH AND CORONARY ANGIOGRAPHY;  Surgeon: Larey Dresser, MD;  Location: [place] [facility];  Service: Cardiovascular;  Laterality: N/A;     Current Outpatient Medications  Medication Sig Dispense Refill   Accu-Chek Softclix Lancets lancets Use to check blood sugar three times daily. 100 each 3   acetaminophen (TYLENOL) 325 MG tablet Take 1-2 tablets (325-650 mg total) by mouth every 4 (four) hours as needed for mild pain.     atorvastatin (LIPITOR) 80 MG tablet TAKE 1 TABLET (80 MG TOTAL) BY MOUTH DAILY (EVENING) 90 tablet 0   Blood Glucose Monitoring Suppl (ACCU-CHEK GUIDE) w/Device KIT Use to check blood sugar three times daily. 1 kit 0   Blood Pressure Monitor DEVI Use as directed to check home blood pressure 2-3 times a week 1 Device 0   Continuous Blood Gluc Receiver (FREESTYLE LIBRE 2 READER) DEVI 1 each by Does not apply route daily. 1 each 0   Continuous  Blood Gluc Sensor (FREESTYLE LIBRE 2 SENSOR) MISC USE EVERY 14 (FOURTEEN) DAYS. 2 each 6   dapagliflozin propanediol (FARXIGA) 5 MG TABS tablet Take 1 tablet (5 mg total) by mouth daily before breakfast. 30 tablet 6   digoxin (LANOXIN) 0.125 MG tablet Take 1 tablet (0.125 mg total) by mouth daily. Please request Cardiologist for future refills. 30 tablet 6   DULoxetine (CYMBALTA) 30 MG capsule TAKE 1 CAPSULE (30 MG TOTAL) BY MOUTH DAILY. 30 capsule 2   ENTRESTO 49-51 MG TAKE 1 TABLET BY MOUTH 2 (TWO) TIMES DAILY (AM+EVENING) 60 tablet 3   Evolocumab (REPATHA SURECLICK) 166 MG/ML SOAJ Inject 140 mg into the skin every 14 (fourteen) days. 2 mL 11   ezetimibe (ZETIA) 10 MG tablet Take 1 tablet (10 mg total) by mouth daily. 30 tablet 3   furosemide (LASIX) 20 MG tablet Take 1 tablet (20 mg total) by mouth 3 (three) times a week. Mondays Wednesdays and Friday 30 tablet 11   glucose blood (ACCU-CHEK GUIDE) test strip Use to check blood sugar three times daily. 100 each 2    insulin aspart (FIASP FLEXTOUCH) 100 UNIT/ML FlexTouch Pen Inject 5-8 Units into the skin 3 (three) times daily before meals. 30 mL 3   insulin degludec (TRESIBA FLEXTOUCH) 200 UNIT/ML FlexTouch Pen Inject 20 Units into the skin daily. 30 mL 3   Insulin Pen Needle 32G X 4 MM MISC Use to inject insulin. 200 each 2   Insulin Syringe-Needle U-100 (RELION INSULIN SYRINGE) 31G X 15/64" 0.3 ML MISC Use to inject insulin daily. 100 each 11   ivabradine (CORLANOR) 5 MG TABS tablet Take 1 tablet (5 mg total) by mouth 2 (two) times daily with a meal. 60 tablet 6   levETIRAcetam (KEPPRA) 750 MG tablet Take 2 tablets (1,500 mg total) by mouth 2 (two) times daily. 360 tablet 4   Menthol, Topical Analgesic, (ICY HOT BACK EX) Apply 1 application topically as needed (neuropathy). Icy hot cream     metFORMIN (GLUCOPHAGE-XR) 500 MG 24 hr tablet Take 1 tablet (500 mg total) by mouth 2 (two) times daily with a meal. 180 tablet 3   metoprolol (TOPROL-XL) 200 MG 24 hr tablet TAKE 0.5 TABLETS (100 MG TOTAL) BY MOUTH DAILY. TAKE WITH OR IMMEDIATELY FOLLOWING A MEAL (EVENING) 15 tablet 6   Oxcarbazepine (TRILEPTAL) 300 MG tablet Take 2 tablets (600 mg total) by mouth 2 (two) times daily. 360 tablet 4   phenytoin (DILANTIN) 100 MG ER capsule Take 1 capsule (100 mg total) by mouth 3 (three) times daily. 270 capsule 4   potassium chloride SA (KLOR-CON M) 20 MEQ tablet Take 1 tablet (20 mEq total) by mouth 3 (three) times a week. Take every Mon, Wed and Fri with Lasix 30 tablet 4   pregabalin (LYRICA) 300 MG capsule TAKE 1 CAPSULE (300 MG TOTAL) BY MOUTH AT BEDTIME. 30 capsule 2   spironolactone (ALDACTONE) 25 MG tablet Take 1 tablet (25 mg total) by mouth at bedtime. 90 tablet 3   warfarin (COUMADIN) 5 MG tablet Take 1-1.5 tablets (5-7.5 mg total) by mouth daily. 40 tablet 3   No current facility-administered medications for this encounter.    Allergies:   Patient has no known allergies.   Social History:  The patient   reports that he has never smoked. He has never used smokeless tobacco. He reports that he does not currently use alcohol. He reports that he does not currently use drugs after having used the following drugs:  Marijuana.   Family History:  The patient's family history includes Diabetes in his mother; Hypertension in his mother; Stroke in his maternal aunt and mother.   ROS:  Please see the history of present illness.   All other systems are personally reviewed and negative.   Vitals:   01/17/22 1145  BP: 101/74  Pulse: 62     Exam:  (Video/Tele Health Call; Exam is subjective and or/visual.) General:  Speaks in full sentences. No resp difficulty. Lungs: Normal respiratory effort with conversation.  Abdomen: Non-distended per patient report Extremities: Pt denies edema. Neuro: Alert & oriented x 3.   Recent Labs: 11/21/2021: ALT 59; B Natriuretic Peptide 36.8; BUN 9; Creatinine, Ser 0.84; Hemoglobin 14.5; Platelets 217; Potassium 4.5; Sodium 141; TSH 0.904  Personally reviewed   Wt Readings from Last 3 Encounters:  01/17/22 71.4 kg (157 lb 6.4 oz)  01/14/22 72.6 kg (160 lb 0.9 oz)  01/14/22 72.6 kg (160 lb)      ASSESSMENT AND PLAN:   1. Chronic systolic CHF: Nonischemic cardiomyopathy known for years (since at least 2008).  Cardiac MRI in 2008 with EF 45%, no LGE.  Normal coronary CTA in 2014.  Echo (7/22) with EF 10-15%, LV thrombus, moderate RV dysfunction, dilated IVC. R/LHC (6/22) showed normal filling pressures, preserved cardiac output and no significant CAD. CPX in 9/22 showed severe HF limitation concerning for advanced HF.  Echo in 9/22 showed EF < 20% with mild LV dilation, no LV thrombus noted, normal RV size with mildly decreased systolic function, IVC normal. Medtronic ICD placed 5/23. Stable NYHA class II- early III symptoms chronically. Ongoing workup at [facility] for heart transplant. He reports wt has been stable at home. Personally reviewed remote device interrogation,  impedence is good, fluid index < threshold. No VT - Continue Lasix 20 mg on MWF.  - Continue spironolactone 25 mg qhs.  - Continue Farxiga 5 mg daily (this was started by endocrinology). - Continue Entresto 49/51 bid. BP too soft for titration  - Continue Toprol XL 100 mg daily. - Continue Ivabradine 5 mg bid.  - Continue digoxin 0.125.  - Though cardiac output was preserved on 6/22 and 3/23 RHCs, CPX in 9/22 showed a severe HF limitation concerning for advanced HF. He has been seen by [facility] for consideration of transplant.  Ongoing workup, needs to graduate from cardiac rehab and get DM under better control.  - Pt instructed to come to clinic for labs, check BMP, BNP and Dig level  2.  Syncope: Suspect this was orthostatic vs vagal rather than arrhythmic.  No further events. Now has ICD.  - no VT on device interrogation today  3. Diabetes: Insulin-dependent, started in his 85s.  Control better recently but has been poor in past.  - Followed by endocrinology. Now on SGLT2i. 4. 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 Denies any recent seizures  - On keppra, dilantin - followed by neurology  5. 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  Followed by Neurology. - On Coumadin and statin. INR followed by coumadin clinic  6. LV Thrombus: with associated CVA. Most recent echo in 9/22 did not show a thrombus.  - On Coumadin, reports full compliance. Denies bleeding issues. - check CBC today    COVID screen The patient does not have any symptoms that suggest any further testing/ screening at this time.  Social distancing reinforced today.  Patient Risk: After full review of this patients clinical status, I feel  that they are at moderate risk for cardiac decompensation at this time.  Relevant cardiac medications were reviewed at length with the patient today. The patient does not have concerns regarding their medications at this time.   The following  changes were made today:  none  Recommended follow-up:  f/u w/ Dr. Aundra Dubin in 2-3 months   Today, I have spent 20 minutes with the patient with telehealth technology discussing the above issues .    Signed, Lyda Jester, PA-C  01/17/2022 2:09 PM  [facility] [address] [facility] and [place] [address] [phone] (office) [phone] (fax)

## 2022-01-17 NOTE — Patient Instructions (Addendum)
Instructions below were sent to patient via My chart message   It was great speaking with you today!  No medication changes are needed at this time  Labs are needed We have you scheduled for 01-18-2022 @ [facility]  Your physician wants you to follow-up in: 3 months with Dr Aundra Dubin. You will receive a reminder letter in the mail two months in advance. If you don't receive a letter, please call our office to schedule the follow-up appointment.   Do the following things EVERYDAY: Weigh yourself in the morning before breakfast. Write it down and keep it in a log. Take your medicines as prescribed Eat low salt foods--Limit salt (sodium) to 2000 mg per day.  Stay as active as you can everyday Limit all fluids for the day to less than 2 liters   At the [facility], you and your health needs are our priority. As part of our continuing mission to provide you with exceptional heart care, we have created designated Provider Care Teams. These Care Teams include your primary Cardiologist (physician) and Advanced Practice Providers (APPs- Physician Assistants and Nurse Practitioners) who all work together to provide you with the care you need, when you need it.   You may see any of the following providers on your designated Care Team at your next follow up: Dr Glori Bickers Dr Loralie Champagne Dr. Roxana Hires, NP Lyda Jester, [place] [facility] [place], [place] Forestine Na, NP Audry Riles, PharmD   Please be sure to bring in all your medications bottles to every appointment.

## 2022-01-17 NOTE — Progress Notes (Signed)
  Patient Consent for Virtual Visit      yes    Jason Stevenson has provided verbal consent on 01/17/2022 for a virtual visit (video or telephone).   CONSENT FOR VIRTUAL VISIT FOR:  Jason Stevenson  By participating in this virtual visit I agree to the following:  I hereby voluntarily request, consent and authorize [facility] and its employed or contracted physicians, physician assistants, nurse practitioners or other licensed health care professionals (the Practitioner), to provide me with telemedicine health care services (the "Services") as deemed necessary by the treating Practitioner. I acknowledge and consent to receive the Services by the Practitioner via telemedicine. I understand that the telemedicine visit will involve communicating with the Practitioner through live audiovisual communication technology and the disclosure of certain medical information by electronic transmission. I acknowledge that I have been given the opportunity to request an in-person assessment or other available alternative prior to the telemedicine visit and am voluntarily participating in the telemedicine visit.  I understand that I have the right to withhold or withdraw my consent to the use of telemedicine in the course of my care at any time, without affecting my right to future care or treatment, and that the Practitioner or I may terminate the telemedicine visit at any time. I understand that I have the right to inspect all information obtained and/or recorded in the course of the telemedicine visit and may receive copies of available information for a reasonable fee.  I understand that some of the potential risks of receiving the Services via telemedicine include:  Delay or interruption in medical evaluation due to technological equipment failure or disruption; Information transmitted may not be sufficient (e.g. poor resolution of images) to allow for appropriate medical decision making by the  Practitioner; and/or  In rare instances, security protocols could fail, causing a breach of personal health information.  Furthermore, I acknowledge that it is my responsibility to provide information about my medical history, conditions and care that is complete and accurate to the best of my ability. I acknowledge that Practitioner's advice, recommendations, and/or decision may be based on factors not within their control, such as incomplete or inaccurate data provided by me or distortions of diagnostic images or specimens that may result from electronic transmissions. I understand that the practice of medicine is not an exact science and that Practitioner makes no warranties or guarantees regarding treatment outcomes. I acknowledge that a copy of this consent can be made available to me via my patient portal ([facility]), or I can request a printed copy by calling the office of [facility].    I understand that my insurance will be billed for this visit.   I have read or had this consent read to me. I understand the contents of this consent, which adequately explains the benefits and risks of the Services being provided via telemedicine.  I have been provided ample opportunity to ask questions regarding this consent and the Services and have had my questions answered to my satisfaction. I give my informed consent for the services to be provided through the use of telemedicine in my medical care

## 2022-01-17 NOTE — Addendum Note (Signed)
Encounter addended by: Consuelo Pandy, PA-C on: 01/17/2022 4:02 PM  Actions taken: Clinical Note Signed

## 2022-01-18 ENCOUNTER — Ambulatory Visit (HOSPITAL_COMMUNITY)
Admission: RE | Admit: 2022-01-18 | Discharge: 2022-01-18 | Disposition: A | Discharge status: Home / Self Care | Payer: Medicaid Other | Source: Ambulatory Visit | Attending: Cardiology | Admitting: Cardiology

## 2022-01-18 ENCOUNTER — Encounter (HOSPITAL_COMMUNITY)
Admission: RE | Admit: 2022-01-18 | Discharge: 2022-01-18 | Disposition: A | Discharge status: Home / Self Care | Payer: Medicaid Other | Source: Ambulatory Visit | Attending: Cardiology | Admitting: Cardiology

## 2022-01-18 ENCOUNTER — Encounter (HOSPITAL_COMMUNITY): Payer: Medicaid Other

## 2022-01-18 DIAGNOSIS — I5022 Chronic systolic (congestive) heart failure: Secondary | ICD-10-CM | POA: Insufficient documentation

## 2022-01-18 LAB — CBC
HCT: 42.3 % (ref 39.0–52.0)
Hemoglobin: 14.3 g/dL (ref 13.0–17.0)
MCH: 29.5 pg (ref 26.0–34.0)
MCHC: 33.8 g/dL (ref 30.0–36.0)
MCV: 87.2 fL (ref 80.0–100.0)
Platelets: 211 10*3/uL (ref 150–400)
RBC: 4.85 MIL/uL (ref 4.22–5.81)
RDW: 11.9 % (ref 11.5–15.5)
WBC: 3.8 10*3/uL — ABNORMAL LOW (ref 4.0–10.5)
nRBC: 0 % (ref 0.0–0.2)

## 2022-01-18 LAB — BASIC METABOLIC PANEL
Anion gap: 11 (ref 5–15)
BUN: 11 mg/dL (ref 6–20)
CO2: 28 mmol/L (ref 22–32)
Calcium: 9.2 mg/dL (ref 8.9–10.3)
Chloride: 102 mmol/L (ref 98–111)
Creatinine, Ser: 0.8 mg/dL (ref 0.61–1.24)
GFR, Estimated: >60 mL/min (ref >60)
Glucose, Bld: 134 mg/dL — ABNORMAL HIGH (ref 70–99)
Potassium: 4.1 mmol/L (ref 3.5–5.1)
Sodium: 141 mmol/L (ref 135–145)

## 2022-01-18 LAB — DIGOXIN LEVEL: Digoxin Level: 0.7 ng/mL — ABNORMAL LOW (ref 0.8–2.0)

## 2022-01-19 DIAGNOSIS — H5203 Hypermetropia, bilateral: Secondary | ICD-10-CM | POA: Diagnosis not present

## 2022-01-19 DIAGNOSIS — H1013 Acute atopic conjunctivitis, bilateral: Secondary | ICD-10-CM | POA: Diagnosis not present

## 2022-01-21 ENCOUNTER — Ambulatory Visit: Payer: Medicaid Other | Attending: Cardiology

## 2022-01-21 DIAGNOSIS — I513 Intracardiac thrombosis, not elsewhere classified: Secondary | ICD-10-CM | POA: Diagnosis not present

## 2022-01-21 DIAGNOSIS — Z5181 Encounter for therapeutic drug level monitoring: Secondary | ICD-10-CM | POA: Diagnosis not present

## 2022-01-21 DIAGNOSIS — Z7901 Long term (current) use of anticoagulants: Secondary | ICD-10-CM | POA: Insufficient documentation

## 2022-01-21 DIAGNOSIS — I639 Cerebral infarction, unspecified: Secondary | ICD-10-CM | POA: Diagnosis not present

## 2022-01-21 LAB — POCT INR: INR: 2.8 (ref 2.0–3.0)

## 2022-01-21 NOTE — Patient Instructions (Addendum)
Description    continue taking Warfarin 1.5 tablets daily except for 1 tablet on Mondays, Wednesday and Fridays. Stay consistent with leafy veggies. Recheck INR in 6 weeks. Coumadin Clinic [phone] or [phone]

## 2022-01-23 ENCOUNTER — Telehealth: Payer: Self-pay

## 2022-01-23 NOTE — Telephone Encounter (Signed)
Call to pt refer PREP referral and questions he has Explained program. He is concerned about cost.  Informed we can help him Okay with starting 10/30 MW 230p-345p Intake scheduled for 10/23 at 330p at [facility] cell number for contact

## 2022-01-23 NOTE — Progress Notes (Signed)
Discharge Progress Report  Patient Details  Name: Jason Stevenson MRN: [id number] Date of Birth: 12-Dec-1985 Referring Provider:   Flowsheet Row CARDIAC REHAB PHASE II ORIENTATION from 11/13/2021 in [place]  Referring Provider Rowland Lathe MD        Number of Visits: 24 exercise classes in traditional cardiac rehab and some education classes  Reason for Discharge:  Patient reached a stable level of exercise. Patient independent in their exercise. Patient has met program and personal goals.  Smoking History:  Social History   Tobacco Use  Smoking Status Never  Smokeless Tobacco Never    Diagnosis:  Heart failure, chronic systolic ([place]) - Plan: Amb Referral To Provider Referral Exercise Program (P.R.E.P)  ADL [facility]:   Initial Exercise Prescription:  Initial Exercise Prescription - 11/13/21 1200       Date of Initial Exercise RX and Referring Provider   Date 11/13/21    Referring Provider Rowland Lathe MD    Expected Discharge Date 01/18/22      NuStep   Level 1    SPM 75    Minutes 25    METs 2.5      Prescription Details   Frequency (times per week) 3    Duration Progress to 10 minutes continuous walking  at current work load and total walking time to 30-45 min      Intensity   THRR 40-80% of Max Heartrate 73-146    Ratings of Perceived Exertion 11-13    Perceived Dyspnea 0-4      Progression   Progression Continue progressive overload as per policy without signs/symptoms or physical distress.      Resistance Training   Training Prescription Yes    Weight 3lbs    Reps 10-15             Discharge Exercise Prescription (Final Exercise Prescription Changes):  Exercise Prescription Changes - 01/18/22 1052       Response to Exercise   Blood Pressure (Admit) 108/66    Blood Pressure (Exercise) 120/84    Blood Pressure (Exit) 100/64    Heart Rate (Admit) 80 bpm    Heart Rate (Exercise) 126 bpm    Heart Rate  (Exit) 90 bpm    Rating of Perceived Exertion (Exercise) 12    Perceived Dyspnea (Exercise) 0    Symptoms 0    Comments Pt graduated the [facility]2 program    Duration Progress to 30 minutes of  aerobic without signs/symptoms of physical distress    Intensity THRR unchanged      Progression   Progression Continue to progress workloads to maintain intensity without signs/symptoms of physical distress.    Average METs 3.7      Resistance Training   Training Prescription Yes    Weight 4 lbs    Reps 10-15    Time 10 Minutes      NuStep   Level 2   Last 15 mins on level 3   SPM 90    Minutes 30    METs 3.7      Home Exercise Plan   Plans to continue exercise at Home (comment)    Frequency Add 2 additional days to program exercise sessions.    Initial Home Exercises Provided 12/19/21             Functional Capacity:  6 Minute Walk     Row Name 11/13/21 0955 01/14/22 0910       6 Minute Walk  Phase Initial Discharge    Distance 770 feet 1660 feet    Distance % Change -- 115.58 %    Distance Feet Change -- 890 ft    Walk Time 6 minutes 6 minutes    # of Rest Breaks 1  5:05-48mn, lightheaded and fatigue 0    MPH 1.46 3.14    METS 3.8 5.48    RPE 13 12    Perceived Dyspnea  2 0    VO2 Peak 13.3 19.18    Symptoms Yes (comment) Yes (comment)    Comments Lightheadedness, fatigue, RPD 2 Some mild dizziness, recovered with rest.    Resting HR 64 bpm 75 bpm    Resting BP 110/70 126/70    Resting Oxygen Saturation  98 % 96 %    Exercise Oxygen Saturation  during 6 min walk 100 % 97 %    Max Ex. HR 70 bpm 87 bpm    Max Ex. BP 110/70 110/64    2 Minute Post BP 105/70 104/64             Psychological, QOL, Others - Outcomes: PHQ 2/9:    01/18/2022    7:37 AM 11/13/2021   10:51 AM 04/20/2021   10:45 AM 02/05/2021   10:24 AM 10/27/2020    9:28 AM  Depression screen PHQ 2/9  Decreased Interest 0 0 1 1 0  Down, Depressed, Hopeless _0 PHQ - 2 Score _1 Altered sleeping 0 _2 Tired, decreased energy 0 _3 Change in appetite 0 _4 Feeling bad or failure about yourself  1 0 0 1   Trouble concentrating 1 1 0 0   Moving slowly or fidgety/restless 1 1 0 0   Suicidal thoughts 0 1 0 0   PHQ-9 Score _5 Difficult doing work/chores  Somewhat difficult       Quality of Life:  Quality of Life - 01/23/22 0926       Quality of Life   Select Quality of Life      Quality of Life Scores   Health/Function Post 20.4 %    Socioeconomic Post 12.43 %    Psych/Spiritual Post 25.71 %    Family Post 24.7 %    GLOBAL Post 20.49 %             Personal Goals: Goals established at orientation with interventions provided to work toward goal.  Personal Goals and Risk Factors at Admission - 11/13/21 1213       Core Components/Risk Factors/Patient Goals on Admission    Weight Management Weight Maintenance;Yes    Intervention Weight Management: Provide education and appropriate resources to help participant work on and attain dietary goals.;Weight Management: Develop a combined nutrition and exercise program designed to reach desired caloric intake, while maintaining appropriate intake of nutrient and fiber, sodium and fats, and appropriate energy expenditure required for the weight goal.    Expected Outcomes Weight Maintenance: Understanding of the daily nutrition guidelines, which includes 25-35% calories from fat, 7% or less cal from saturated fats, less than 2059mcholesterol, less than 1.5gm of sodium, & 5 or more servings of fruits and vegetables daily;Understanding recommendations for meals to include 15-35% energy as protein, 25-35% energy from fat, 35-60% energy from carbohydrates, less than 20022mf dietary cholesterol, 20-35 gm of total fiber daily    Diabetes Yes  Intervention Provide education about proper nutrition, including hydration, and aerobic/resistive exercise prescription along with prescribed medications to  achieve blood glucose in normal ranges: Fasting glucose 65-99 mg/dL;Provide education about signs/symptoms and action to take for hypo/hyperglycemia.    Expected Outcomes Long Term: Attainment of HbA1C < 7%.;Short Term: Participant verbalizes understanding of the signs/symptoms and immediate care of hyper/hypoglycemia, proper foot care and importance of medication, aerobic/resistive exercise and nutrition plan for blood glucose control.    Heart Failure Yes    Intervention Provide a combined exercise and nutrition program that is supplemented with education, support and counseling about heart failure. Directed toward relieving symptoms such as shortness of breath, decreased exercise tolerance, and extremity edema.    Expected Outcomes Improve functional capacity of life;Short term: Attendance in program 2-3 days a week with increased exercise capacity. Reported lower sodium intake. Reported increased fruit and vegetable intake. Reports medication compliance.;Short term: Daily weights obtained and reported for increase. Utilizing diuretic protocols set by physician.;Long term: Adoption of self-care skills and reduction of barriers for early signs and symptoms recognition and intervention leading to self-care maintenance.    Hypertension Yes    Intervention Provide education on lifestyle modifcations including regular physical activity/exercise, weight management, moderate sodium restriction and increased consumption of fresh fruit, vegetables, and low fat dairy, alcohol moderation, and smoking cessation.;Monitor prescription use compliance.    Expected Outcomes Short Term: Continued assessment and intervention until BP is < 140/38m HG in hypertensive participants. < 130/859mHG in hypertensive participants with diabetes, heart failure or chronic kidney disease.;Long Term: Maintenance of blood pressure at goal levels.    Lipids Yes    Intervention Provide education and support for participant on nutrition &  aerobic/resistive exercise along with prescribed medications to achieve LDL <7023mHDL >53m17m  Expected Outcomes Short Term: Participant states understanding of desired cholesterol values and is compliant with medications prescribed. Participant is following exercise prescription and nutrition guidelines.;Long Term: Cholesterol controlled with medications as prescribed, with individualized exercise RX and with personalized nutrition plan. Value goals: LDL < 70mg32mL > 40 mg.    Stress Yes    Intervention Offer individual and/or small group education and counseling on adjustment to heart disease, stress management and health-related lifestyle change. Teach and support self-help strategies.    Expected Outcomes Short Term: Participant demonstrates changes in health-related behavior, relaxation and other stress management skills, ability to obtain effective social support, and compliance with psychotropic medications if prescribed.;Long Term: Emotional wellbeing is indicated by absence of clinically significant psychosocial distress or social isolation.              Personal Goals Discharge:  Goals and Risk Factor Review     Row Name 12/05/21 0746 01/01/22 0901 01/22/22 1502         Core Components/Risk Factors/Patient Goals Review   Personal Goals Review Weight Management/Obesity;Diabetes;Heart Failure;Hypertension;Lipids;Stress Weight Management/Obesity;Diabetes;Heart Failure;Hypertension;Lipids;Stress --     Review GeralTamiff to a good start and has completed 7 exercise session in traditional cardiac rehab.  GeralKylonis deconditioned from chronic nature of his disease process and medical complicated recovery is slowly making progress.  GeralAugustinocomplete neurological  follow up and was assured . GeralBrogengained 2 kg although GeralOzziered to maintain his weight his BMI rflects normal weight. He has noticed an increase in his appetite now that he is exercising.  GeralJahdieln optimal  heart failure regimen and is compliant and attends follow up appts in the  heart failure clinic.  Vital signs and blood glucose readings are within normal limits.  Zakir attends the educational classes regarding stress as able and feels the relaxation techniques are beneficial. Curlee has completed 15 exercise sessions in [place] is making progress and now drives himself to Cardiac rehab. Met level continues to increase as the workload for the stepper increases to 20 minutes on level 2 and 10 minutes on level 3 with the plan to continue to increase the duration of time with the higher workload. Ranell reports within normal limits cbg with the CGM.  Braeton continue on optimal heart failure regimen and is compliant with medication and follow up appts.  Vital signs are reassuring.  Kwaku reports decrease in his stress as he is growing more confident in his abilities.  Marginal weight gain continue to work with RD for progress toward the desired weight gain goal. [name] graduates today with the completion of 24 exercise and some education classess.  Plans to continue exercise at the PREP program at the [facility] if able to receive financial assistance. Feels stronger and reports increase energy.  Feels hopeful for the first time in many months. Attributes this to his faith foundation adn positive encouragement from the staff.  Absolute joy to have in the program and he will be greatly missed.     Expected Outcomes Betty will adopt a heart healthy lifestyle in accordance to the tree pillars learned from the [facility] cardiac rehab: hear healthy nutrition and healthy mindset. Orlen will adopt a heart healthy lifestyle in accordance to the tree pillars learned from the [facility] cardiac rehab: hear healthy nutrition and healthy mindset. Keith has adopted a heart healthy lifestyle and is commended with the progress he has made both physically and mentally. Matei wife is pleased with his  prgress and has noticed a difference in his attitude.              Exercise Goals and Review:  Exercise Goals     Row Name 11/13/21 1223             Exercise Goals   Increase Physical Activity Yes       Intervention Provide advice, education, support and counseling about physical activity/exercise needs.;Develop an individualized exercise prescription for aerobic and resistive training based on initial evaluation findings, risk stratification, comorbidities and participant's personal goals.       Expected Outcomes Long Term: Exercising regularly at least 3-5 days a week.;Long Term: Add in home exercise to make exercise part of routine and to increase amount of physical activity.;Short Term: Attend rehab on a regular basis to increase amount of physical activity.       Increase Strength and Stamina Yes       Intervention Provide advice, education, support and counseling about physical activity/exercise needs.;Develop an individualized exercise prescription for aerobic and resistive training based on initial evaluation findings, risk stratification, comorbidities and participant's personal goals.       Expected Outcomes Short Term: Increase workloads from initial exercise prescription for resistance, speed, and METs.;Short Term: Perform resistance training exercises routinely during rehab and add in resistance training at home;Long Term: Improve cardiorespiratory fitness, muscular endurance and strength as measured by increased METs and functional capacity (6MWT)       Able to understand and use rate of perceived exertion (RPE) scale Yes       Intervention Provide education and explanation on how to use RPE scale  Expected Outcomes Long Term:  Able to use RPE to guide intensity level when exercising independently;Short Term: Able to use RPE daily in rehab to express subjective intensity level       Knowledge and understanding of Target Heart Rate Range (THRR) Yes       Intervention  Provide education and explanation of THRR including how the numbers were predicted and where they are located for reference       Expected Outcomes Short Term: Able to state/look up THRR;Long Term: Able to use THRR to govern intensity when exercising independently;Short Term: Able to use daily as guideline for intensity in rehab       Understanding of Exercise Prescription Yes       Intervention Provide education, explanation, and written materials on patient's individual exercise prescription       Expected Outcomes Long Term: Able to explain home exercise prescription to exercise independently;Short Term: Able to explain program exercise prescription                Exercise Goals Re-Evaluation:  Exercise Goals Re-Evaluation     Row Name 11/19/21 0820 12/19/21 0925 01/18/22 0830         Exercise Goal Re-Evaluation   Exercise Goals Review Increase Physical Activity;Increase Strength and Stamina;Able to understand and use rate of perceived exertion (RPE) scale;Knowledge and understanding of Target Heart Rate Range (THRR);Understanding of Exercise Prescription Increase Physical Activity;Increase Strength and Stamina;Able to understand and use rate of perceived exertion (RPE) scale;Knowledge and understanding of Target Heart Rate Range (THRR);Understanding of Exercise Prescription Increase Physical Activity;Increase Strength and Stamina;Able to understand and use rate of perceived exertion (RPE) scale;Knowledge and understanding of Target Heart Rate Range (THRR);Understanding of Exercise Prescription     Comments Pt first day in the CRP2 program. Pt tolerated low level activity well with an average MEt level od 1.0. Pt is learning his THRR, RPE and Ex Rx. Reviewed MET's, goals and home ExRx with pt today. Pt tolerated exercise well with an average MET level of 2.8. Pt will continue to exercise on his own by walking 2-3 days a week with his wife. Pt is interested in joining a local recreation  center, encouraged him to look into this further. Pt feels good about his goals of gaining strength and stamina and is feeling less fearful and more confident. Overall pt is doing well and he notes that his family seems to notice he feels better and etting back to his old self. Pt graduated the [facility] program. Pt tolerated exercise well with an average MET level of 3.7. Pt will continue to exercise on his own by walking 5-7 days a week with his wife. Referral has been sent to [facility] program. He hopes to join there soon. Pt is so happy with his progress and is so thankful for the [facility] team.     Expected Outcomes Will continue to monitor pt and progress workloads as tolerated without sign or symptom Pt will continue to exercise on his own at home. Will continue to monitor pt and progress workloads as tolerated without sign or symptom Pt will continue to exercise on his own at home and gain strength              Nutrition & Weight - Outcomes:  Pre Biometrics - 11/13/21 0913       Pre Biometrics   Waist Circumference 33.5 inches    Hip Circumference 37.5 inches    Waist to Hip Ratio 0.89 %  Triceps Skinfold 9 mm    % Body Fat 18.9 %    Grip Strength 40 kg    Flexibility --   Not done due to pt reporting back pain   Single Leg Stand 17.31 seconds             Post Biometrics - 01/14/22 0852        Post  Biometrics   Height 6' (1.829 m)    Weight 160 lb 0.9 oz (72.6 kg)    Waist Circumference 34 inches    Hip Circumference 36 inches    Waist to Hip Ratio 0.94 %    BMI (Calculated) 21.7    Triceps Skinfold 11 mm    % Body Fat 20 %    Grip Strength 44 kg    Flexibility --   Did not complete   Single Leg Stand 30 seconds             Nutrition:  Nutrition Therapy & Goals - 12/31/21 0847       Nutrition Therapy   Diet Heart Healthy/Carbohydrate Consistent Diet    Drug/Food Interactions Coumadin/Vit K;Statins/Certain Fruits      Personal Nutrition Goals   Nutrition  Goal Patient to choose a daily variety of fruit, vegetables, whole grains, lean protein/plant protein, nonfat dairy as part of heart healthy lifestyle    Personal Goal #2 Patient to limit to <1544m of sodium daily    Personal Goal #3 Patient to identify and limit food sources of saturated fat, trans fat, sodium, and refined carbohydrates    Comments Goals in progress. 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 A1c was up to 8.7 on 11/15/2021. He continues to have some higher blood sugar readings in the evenings. Patient reports good understanding of carbohydrate counting; he continues regular follow-up with endocrinology. Patient unable to attend the [facility] education series due to insurance limitations.      Intervention Plan   Intervention Prescribe, educate and counsel regarding individualized specific dietary modifications aiming towards targeted core components such as weight, hypertension, lipid management, diabetes, heart failure and other comorbidities.;Nutrition handout(s) given to patient.    Expected Outcomes Short Term Goal: Understand basic principles of dietary content, such as calories, fat, sodium, cholesterol and nutrients.;Long Term Goal: Adherence to prescribed nutrition plan.             Nutrition Discharge:  Nutrition Assessments - 01/24/22 0903       Rate Your Plate Scores   Post Score 64             Education Questionnaire Score:  Knowledge Questionnaire Score - 01/23/22 0918       Knowledge Questionnaire Score   Post Score 22/24             Goals reviewed with patient GBerneta [facility] with completion of    24 exercise and some education sessions. Pt maintained good attendance and progressed very nicely during their participation in rehab as evidenced by increased MET level.   Medication list reconciled. Repeat  PHQ score 4  .  Pt has made significant lifestyle  changes and should be commended for their success.  GZakhaiachieved their goals during cardiac rehab.   [facility] otherwise he will walk in his neighborhood. CCherre Huger BSN Cardiac and PTraining and development officer

## 2022-01-27 ENCOUNTER — Encounter: Payer: Self-pay | Admitting: Internal Medicine

## 2022-01-28 ENCOUNTER — Telehealth (HOSPITAL_COMMUNITY): Payer: Self-pay | Admitting: Licensed Clinical Social Worker

## 2022-01-28 NOTE — Telephone Encounter (Signed)
H&V Care Navigation CSW Progress Note  Clinical Social Worker received call from pt requesting help with housing.  Has been dealing with water leak making water bill very expensive.  Landlord has now acknowledged leak and is working on Office manager and has asked they move out of the home while this is happening without providing them another place to go and requesting they continue to pay rent.  They are now wondering about temporary housing and possible permanent change in housing given landlords handling of this situation.  CSW informed pt that there were no options for paid for temporary options for housing other than shelter.  Offered to assist with start up cost to go to new permanent housing options if needed and sent list of local options in pt price range ($750).   SDOH Screenings   Food Insecurity: No Food Insecurity (09/14/2020)  Recent Concern: Food Insecurity - Food Insecurity Present (09/13/2020)  Housing: Medium Risk (01/28/2022)  Transportation Needs: Unmet Transportation Needs (12/15/2020)  Utilities: At Risk (12/18/2021)  Depression (PHQ2-9): Low Risk  (01/18/2022)  Recent Concern: Depression (PHQ2-9) - Medium Risk (11/13/2021)  Financial Resource Strain: High Risk (10/18/2021)  Tobacco Use: Low Risk  (01/17/2022)   Jorge Ny, LCSW Clinical Social Worker [facility] Desk#: [phone] Cell#: [phone]

## 2022-01-29 ENCOUNTER — Telehealth: Payer: Self-pay

## 2022-01-29 ENCOUNTER — Other Ambulatory Visit: Payer: Self-pay | Admitting: Internal Medicine

## 2022-01-29 ENCOUNTER — Other Ambulatory Visit (HOSPITAL_COMMUNITY): Payer: Self-pay

## 2022-01-29 NOTE — Telephone Encounter (Signed)
Patient Advocate Encounter  Prior Authorization for [employer] 100[employer]/ML pen-injectors  has been approved.    PA# [id number]  Effective dates: 01/29/22 through 01/29/23

## 2022-01-30 ENCOUNTER — Ambulatory Visit (HOSPITAL_COMMUNITY): Payer: Medicaid Other

## 2022-01-30 NOTE — Telephone Encounter (Signed)
Pt advised via Mychart. 

## 2022-01-31 ENCOUNTER — Ambulatory Visit: Payer: Medicaid Other | Attending: Internal Medicine | Admitting: Internal Medicine

## 2022-01-31 ENCOUNTER — Encounter: Payer: Self-pay | Admitting: Internal Medicine

## 2022-01-31 VITALS — BP 114/71 | HR 88 | Temp 98.0°F | Ht 71.0 in | Wt 162.6 lb

## 2022-01-31 DIAGNOSIS — Z8673 Personal history of transient ischemic attack (TIA), and cerebral infarction without residual deficits: Secondary | ICD-10-CM | POA: Diagnosis not present

## 2022-01-31 DIAGNOSIS — Z7984 Long term (current) use of oral hypoglycemic drugs: Secondary | ICD-10-CM | POA: Insufficient documentation

## 2022-01-31 DIAGNOSIS — Z95810 Presence of automatic (implantable) cardiac defibrillator: Secondary | ICD-10-CM | POA: Diagnosis not present

## 2022-01-31 DIAGNOSIS — E782 Mixed hyperlipidemia: Secondary | ICD-10-CM | POA: Insufficient documentation

## 2022-01-31 DIAGNOSIS — Z794 Long term (current) use of insulin: Secondary | ICD-10-CM | POA: Diagnosis not present

## 2022-01-31 DIAGNOSIS — Z23 Encounter for immunization: Secondary | ICD-10-CM

## 2022-01-31 DIAGNOSIS — G40909 Epilepsy, unspecified, not intractable, without status epilepticus: Secondary | ICD-10-CM | POA: Insufficient documentation

## 2022-01-31 DIAGNOSIS — E1142 Type 2 diabetes mellitus with diabetic polyneuropathy: Secondary | ICD-10-CM | POA: Insufficient documentation

## 2022-01-31 DIAGNOSIS — I110 Hypertensive heart disease with heart failure: Secondary | ICD-10-CM | POA: Insufficient documentation

## 2022-01-31 DIAGNOSIS — E1159 Type 2 diabetes mellitus with other circulatory complications: Secondary | ICD-10-CM

## 2022-01-31 DIAGNOSIS — I428 Other cardiomyopathies: Secondary | ICD-10-CM | POA: Diagnosis not present

## 2022-01-31 DIAGNOSIS — Z79899 Other long term (current) drug therapy: Secondary | ICD-10-CM | POA: Diagnosis not present

## 2022-01-31 DIAGNOSIS — I152 Hypertension secondary to endocrine disorders: Secondary | ICD-10-CM | POA: Diagnosis not present

## 2022-01-31 DIAGNOSIS — I5022 Chronic systolic (congestive) heart failure: Secondary | ICD-10-CM | POA: Insufficient documentation

## 2022-01-31 NOTE — Progress Notes (Signed)
Patient ID: Jason Stevenson, male    DOB: 1985-10-19  MRN: [id number]  CC: Diabetes   Subjective: [name] is a 36 y.o. male who presents for chronic ds management His concerns today include:  Patient with history of DM with painful neuropathy, HTN, HL, NICM with systolic CHF EF <03%, ICD ( Cardiologist Dr. 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  No recent seizures. Continued on Keppra, Trileptal and Dilantin  DM Lab Results  Component Value Date   HGBA1C 8.7 (A) 11/15/2021  Followed by endocrinologist Dr. Cruzita Lederer who we last saw her 11/15/2021. He has libre device but has had problems recently getting it to connect to his phone.  He tried deleting the app and reinstalling it without success.  He placed the app on his wife's phone and was able to connect that way.  However he does not have that phone with him today. Reports compliance with taking Farxiga 5 mg daily, aspart insulin 5 to 8 units with meals, Tresiba 20 units daily and metformin 500 mg twice a day.  Feels he is doing okay with his eating habits. -On Lyrica for diabetic neuropathy.  Also on Cymbalta.  Still gets some neuropathy symptoms more so in his feet.  HTN/NICM/CHF:  -Since I last saw him, he has had ICD placed. -Currently being worked up through [facility] transplant clinic to become a candidate for heart transplant.  He was given 2 of 3 hepatitis B vaccines and they are and told to get his third vaccine for which he is overdue. -Reports compliance with medications including Entresto, furosemide and potassium M/W/F, spironolactone 25 mg daily, Toprol 200 mg daily, digoxin 0.125 mg daily. -No lower extremity edema.  No chest pains. -Still on Coumadin.  Followed by anticoagulation clinic with cardiology.  Reports no bruising or bleeding on the  medication.  Recent CBC done earlier this month revealed normal hemoglobin and platelet count. -Reports compliance with his cholesterol medications including Repatha, Zetia and atorvastatin.  HM: Agrees to flu vaccine.  Reports having had his eye exam done last month at [facility].  Due for COVID booster.   Patient Active Problem List   Diagnosis Date Noted   Nonischemic cardiomyopathy ([place]) 08/27/2021   LADA (latent autoimmune diabetes in adults), managed as type 2 ([place]) 03/28/2021   Poorly controlled type 2 diabetes mellitus with circulatory disorder ([place]) 03/19/2021   Ischemic cardiomyopathy    Syncope 12/14/2020   Chest pain 10/30/2020   History of cerebrovascular accident (CVA) with residual deficit 10/27/2020   Protein-calorie malnutrition, severe 09/22/2020   Acute CVA (cerebrovascular accident) ([place]) 09/21/2020   Near syncope 09/21/2020   Encounter for monitoring Coumadin therapy 09/20/2020   LV (left ventricular) mural thrombus 09/13/2020   Seizure ([place]) 09/13/2020   Neurological deficit present 09/12/2020   History of COVID-19 09/12/2020   Intracerebral hemorrhage 09/12/2020   Acute cerebrovascular accident (CVA) ([place]) 09/12/2020   COVID-19 08/23/2020   Noncompliance with medication treatment due to intermittent use of medication 10/12/2018   Dyslipidemia 55/97/4163   Chronic systolic CHF (congestive heart failure) ([place]) 06/18/2015   Needs flu shot 12/23/2013   Non-ischemic cardiomyopathy ([place]) 11/06/2012   HTN (hypertension) 11/06/2012   HLD (hyperlipidemia) 11/06/2012     Current Outpatient Medications on File Prior to Visit  Medication Sig Dispense Refill   acetaminophen (TYLENOL) 325 MG  tablet Take 1-2 tablets (325-650 mg total) by mouth every 4 (four) hours as needed for mild pain.     atorvastatin (LIPITOR) 80 MG tablet TAKE 1 TABLET (80 MG TOTAL) BY MOUTH DAILY (EVENING) 90 tablet 0   Blood Pressure Monitor DEVI Use as directed to check home blood pressure 2-3  times a week 1 Device 0   Continuous Blood Gluc Receiver (FREESTYLE LIBRE 2 READER) DEVI 1 each by Does not apply route daily. 1 each 0   Continuous Blood Gluc Sensor (FREESTYLE LIBRE 2 SENSOR) MISC USE EVERY 14 (FOURTEEN) DAYS. 2 each 6   dapagliflozin propanediol (FARXIGA) 5 MG TABS tablet Take 1 tablet (5 mg total) by mouth daily before breakfast. 30 tablet 6   digoxin (LANOXIN) 0.125 MG tablet Take 1 tablet (0.125 mg total) by mouth daily. Please request Cardiologist for future refills. 30 tablet 6   DULoxetine (CYMBALTA) 30 MG capsule TAKE 1 CAPSULE (30 MG TOTAL) BY MOUTH DAILY. 30 capsule 2   ENTRESTO 49-51 MG TAKE 1 TABLET BY MOUTH 2 (TWO) TIMES DAILY (AM+EVENING) 60 tablet 3   Evolocumab (REPATHA SURECLICK) 977 MG/ML SOAJ Inject 140 mg into the skin every 14 (fourteen) days. 2 mL 11   ezetimibe (ZETIA) 10 MG tablet Take 1 tablet (10 mg total) by mouth daily. 30 tablet 3   furosemide (LASIX) 20 MG tablet Take 1 tablet (20 mg total) by mouth 3 (three) times a week. Mondays Wednesdays and Friday 30 tablet 11   glucose blood (ACCU-CHEK GUIDE) test strip Use to check blood sugar three times daily. 100 each 2   insulin aspart (FIASP FLEXTOUCH) 100 UNIT/ML FlexTouch Pen Inject 5-8 Units into the skin 3 (three) times daily before meals. 30 mL 3   Insulin Pen Needle 32G X 4 MM MISC Use to inject insulin. 200 each 2   Insulin Syringe-Needle U-100 (RELION INSULIN SYRINGE) 31G X 15/64" 0.3 ML MISC Use to inject insulin daily. 100 each 11   ivabradine (CORLANOR) 5 MG TABS tablet Take 1 tablet (5 mg total) by mouth 2 (two) times daily with a meal. 60 tablet 6   levETIRAcetam (KEPPRA) 750 MG tablet Take 2 tablets (1,500 mg total) by mouth 2 (two) times daily. 360 tablet 4   Menthol, Topical Analgesic, (ICY HOT BACK EX) Apply 1 application topically as needed (neuropathy). Icy hot cream     metFORMIN (GLUCOPHAGE-XR) 500 MG 24 hr tablet Take 1 tablet (500 mg total) by mouth 2 (two) times daily with a meal.  180 tablet 3   metoprolol (TOPROL-XL) 200 MG 24 hr tablet TAKE 0.5 TABLETS (100 MG TOTAL) BY MOUTH DAILY. TAKE WITH OR IMMEDIATELY FOLLOWING A MEAL (EVENING) 15 tablet 6   Oxcarbazepine (TRILEPTAL) 300 MG tablet Take 2 tablets (600 mg total) by mouth 2 (two) times daily. 360 tablet 4   phenytoin (DILANTIN) 100 MG ER capsule Take 1 capsule (100 mg total) by mouth 3 (three) times daily. 270 capsule 4   potassium chloride SA (KLOR-CON M) 20 MEQ tablet Take 1 tablet (20 mEq total) by mouth 3 (three) times a week. Take every Mon, Wed and Fri with Lasix 30 tablet 4   pregabalin (LYRICA) 300 MG capsule TAKE 1 CAPSULE (300 MG TOTAL) BY MOUTH AT BEDTIME. 30 capsule 2   spironolactone (ALDACTONE) 25 MG tablet Take 1 tablet (25 mg total) by mouth at bedtime. 90 tablet 3   TRESIBA FLEXTOUCH 200 UNIT/ML FlexTouch Pen INJECT 20 UNITS INTO THE SKIN DAILY. 18 mL  3   warfarin (COUMADIN) 5 MG tablet Take 1-1.5 tablets (5-7.5 mg total) by mouth daily. 40 tablet 3   Accu-Chek Softclix Lancets lancets Use to check blood sugar three times daily. (Patient not taking: Reported on 01/31/2022) 100 each 3   Blood Glucose Monitoring Suppl (ACCU-CHEK GUIDE) w/Device KIT Use to check blood sugar three times daily. (Patient not taking: Reported on 01/31/2022) 1 kit 0   No current facility-administered medications on file prior to visit.    No Known Allergies  Social History   Socioeconomic History   Marital status: Married    Spouse name: [name]   Number of children: 0   Years of education: 13   Highest education level: Some college, no degree  Occupational History   Occupation: unemployed  Tobacco Use   Smoking status: Never   Smokeless tobacco: Never  Vaping Use   Vaping Use: Not on file  Substance and Sexual Activity   Alcohol use: Not Currently    Comment: "occasional" when "hanging out with the wrong people" No recent use.   Drug use: Not Currently    Types: Marijuana    Comment: occasional, last 2013    Sexual activity: Not Currently  Other Topics Concern   Not on file  Social History Narrative   Pt lives at home with his wife    Right handed    Caffeine- hardly any   Social Determinants of Health   Financial Resource Strain: High Risk (10/18/2021)   Overall Financial Resource Strain (CARDIA)    Difficulty of Paying Living Expenses: Hard  Food Insecurity: No Food Insecurity (09/14/2020)   Hunger Vital Sign    Worried About Running Out of Food in the Last Year: Never true    Ran Out of Food in the Last Year: Never true  Recent Concern: Food Insecurity - Food Insecurity Present (09/13/2020)   Hunger Vital Sign    Worried About Running Out of Food in the Last Year: Never true    Ran Out of Food in the Last Year: Sometimes true  Transportation Needs: Unmet Transportation Needs (12/15/2020)   PRAPARE - Hydrologist (Medical): No    Lack of Transportation (Non-Medical): Yes  Physical Activity: Not on file  Stress: Not on file  Social Connections: Not on file  Intimate Partner Violence: Not on file    Family History  Problem Relation Age of Onset   Stroke Mother    Diabetes Mother    Hypertension Mother    Stroke Maternal Aunt    Heart attack Neg Hx     Past Surgical History:  Procedure Laterality Date   ICD IMPLANT N/A 08/27/2021   Procedure: ICD IMPLANT;  Surgeon: Vickie Epley, MD;  Location: [place] [facility];  Service: Cardiovascular;  Laterality: N/A;   None     RIGHT HEART CATH  [facility]21/20[facility]   RIGHT/LEFT HEART CATH AND CORONARY ANGIOGRAPHY N/A 11/02/2020   Procedure: RIGHT/LEFT HEART CATH AND CORONARY ANGIOGRAPHY;  Surgeon: Larey Dresser, MD;  Location: [place] [facility];  Service: Cardiovascular;  Laterality: N/A;    ROS: Review of Systems Negative except as stated above  PHYSICAL EXAM: BP 114/71   Pulse 88   Temp 98 F (36.7 C) (Temporal)   Ht '5\' 11"'  (1.803 m)   Wt 162 lb 9.6 oz (73.8 kg)   SpO2 99%   BMI  22.68 kg/m   Physical Exam  General appearance - alert,  well appearing, young African-American male and in no distress Mental status - normal mood, behavior, speech, dress, motor activity, and thought processes Neck - supple, no significant adenopathy Chest - clear to auscultation, no wheezes, rales or rhonchi, symmetric air entry Heart - normal rate, regular rhythm, normal S1, S2, no murmurs, rubs, clicks or gallops Extremities - peripheral pulses normal, no pedal edema, no clubbing or cyanosis Diabetic Foot Exam - Simple   Simple Foot Form Diabetic Foot exam was performed with the following findings: Yes 01/31/2022  6:03 PM  Visual Inspection See comments: Yes Sensation Testing See comments: Yes Pulse Check Posterior Tibialis and Dorsalis pulse intact bilaterally: Yes Comments Sensation on deep exam slightly decreased on plantar surface.  He is flat-footed.         Latest Ref Rng & Units 01/18/2022    9:17 AM 11/21/2021    8:57 AM 11/16/2021    9:38 AM  CMP  Glucose 70 - 99 mg/dL 134  194  229   BUN 6 - 20 mg/dL '11  9  7   ' Creatinine 0.61 - 1.24 mg/dL 0.80  0.84  0.71   Sodium 135 - 145 mmol/L 141  141  141   Potassium 3.5 - 5.1 mmol/L 4.1  4.5  4.4   Chloride 98 - 111 mmol/L 102  103  105   CO2 22 - 32 mmol/L 28  31  32   Calcium 8.9 - 10.3 mg/dL 9.2  9.2  8.7   Total Protein 6.5 - 8.1 g/dL  7.0    Total Bilirubin 0.3 - 1.2 mg/dL  0.3    Alkaline Phos 38 - 126 U/L  98    AST 15 - 41 U/L  37    ALT 0 - 44 U/L  59     Lipid Panel     Component Value Date/Time   CHOL 133 09/21/2021 1146   CHOL 107 06/08/2021 0851   TRIG 149 09/21/2021 1146   HDL 47 09/21/2021 1146   HDL 49 06/08/2021 0851   CHOLHDL 2.8 09/21/2021 1146   VLDL 30 09/21/2021 1146   LDLCALC 56 09/21/2021 1146   LDLCALC 31 06/08/2021 0851    CBC    Component Value Date/Time   WBC 3.8 (L) 01/18/2022 0917   RBC 4.85 01/18/2022 0917   HGB 14.3 01/18/2022 0917   HGB 13.7 08/03/2021 1313   HCT 42.3  01/18/2022 0917   HCT 40.0 08/03/2021 1313   PLT 211 01/18/2022 0917   PLT 275 08/03/2021 1313   MCV 87.2 01/18/2022 0917   MCV 83 08/03/2021 1313   MCH 29.5 01/18/2022 0917   MCHC 33.8 01/18/2022 0917   RDW 11.9 01/18/2022 0917   RDW 11.8 08/03/2021 1313   LYMPHSABS 1.6 11/21/2021 0857   LYMPHSABS 2.0 08/03/2021 1313   MONOABS 0.2 11/21/2021 0857   EOSABS 0.1 11/21/2021 0857   EOSABS 0.1 08/03/2021 1313   BASOSABS 0.0 11/21/2021 0857   BASOSABS 0.1 08/03/2021 1313    ASSESSMENT AND PLAN: 1. Type 2 diabetes mellitus with diabetic polyneuropathy, with long-term current use of insulin ([place]) Patient to continue current diabetic medications as prescribed by endocrinologist.  Medications are Farxiga 5 mg daily, aspart insulin 5 to 8 units with meals, Tresiba 20 units daily and metformin 500 mg twice a day. -I asked our clinical pharmacist to see him today to see if he could help problem solve getting his libre device connected again to his cell phone.  2. NICM (nonischemic  cardiomyopathy) ([place]) 3. Chronic systolic congestive heart failure ([place]) -Compensated on exam today. Recommend he continue his current medications including Entresto, furosemide, potassium supplement to be taken with furosemide, spironolactone, Toprol and digoxin.  4. Hypertension associated with diabetes ([place]) At goal.  Continue current medications listed above  5. Seizure disorder ([place]) No recent seizures.  Stable on current medications including Keppra, Dilantin and Trileptal  6. Mixed hyperlipidemia Continue Repatha, atorvastatin 80 mg and Zetia 10 mg.  Last LDL at goal.  7. Need for hepatitis B vaccination - Hepatitis B vaccine adult IM  8. Need for immunization against influenza - Flu Vaccine QUAD 33moIM (Fluarix, Fluzone & Alfiuria Quad PF)     Patient was given the opportunity to ask questions.  Patient verbalized understanding of the plan and was able to repeat key elements of the plan.   This  documentation was completed using DRadio producer  Any transcriptional errors are unintentional.  Orders Placed This Encounter  Procedures   Hepatitis B vaccine adult IM   Flu Vaccine QUAD 684moM (Fluarix, Fluzone & Alfiuria Quad PF)     Requested Prescriptions    No prescriptions requested or ordered in this encounter    Return in about 4 months (around 06/03/2022).  DeKarle PlumberMD, FACP

## 2022-02-06 NOTE — Progress Notes (Signed)
[facility] PREP Evaluation  Patient Details  Name: Jason Stevenson MRN: [id number] Date of Birth: 1985-05-11 Age: 36 y.o. PCP: Marcine Matar, MD  Vitals:   02/06/22 1638  BP: 108/69  Pulse: 66  SpO2: 99%  Weight: 163 lb 3.2 oz (74 kg)     [facility] Eval - 02/06/22 1600       [facility] "PREP" Location   [facility] "PREP" Location [facility]      Referral    Referring Provider Shirlee Latch    Reason for referral Inactivity;Heart Failure;Stroke;Diabetes    Program Start Date 02/11/22   MW 230p-345p x 12 wks     Measurement   Waist Circumference 32 inches    Hip Circumference 35 inches    Body fat --   not measured     Information for Trainer   Goals Gain stamina and endurance, get on txp list, build muscle    Current Exercise walk occ    Orthopedic Concerns LBP occ    Pertinent Medical History CHF,IDDM, +AICD    Current Barriers none    Restrictions/Precautions Diabetic snack before exercise    Medications that affect exercise Medication causing dizziness/drowsiness      Mobility and Daily Activities   I find it easy to walk up or down two or more flights of stairs. 1    I have no trouble taking out the trash. 2    I do housework such as vacuuming and dusting on my own without difficulty. 3    I can easily lift a gallon of milk (8lbs). 4    I can easily walk a mile. 4    I have no trouble reaching into high cupboards or reaching down to pick up something from the floor. 2    I do not have trouble doing out-door work such as Loss adjuster, chartered, raking leaves, or gardening. 3      Mobility and Daily Activities   I feel younger than my age. 2    I feel independent. 2    I feel energetic. 1    I live an active life.  1    I feel strong. 1    I feel healthy. 2    I feel active as other people my age. 1      How fit and strong are you.   Fit and Strong Total Score 29            Past Medical History:  Diagnosis Date   Chronic systolic CHF (congestive heart failure) (HCC)  06/18/2015   Diabetes mellitus without complication (HCC)    Hemorrhagic stroke (HCC)    Hyperlipidemia    Hypertension    Nonischemic cardiomyopathy (HCC) Noted as early as 2007   Per chart review (cards consult note 2011), EF of 40% in 2007, down to 20-25% in 2011   Past Surgical History:  Procedure Laterality Date   ICD IMPLANT N/A 08/27/2021   Procedure: ICD IMPLANT;  Surgeon: Lanier Prude, MD;  Location: [facility] INVASIVE [facility] LAB;  Service: Cardiovascular;  Laterality: N/A;   None     RIGHT HEART CATH  [facility]/21/2021   [facility]   RIGHT/LEFT HEART CATH AND CORONARY ANGIOGRAPHY N/A 11/02/2020   Procedure: RIGHT/LEFT HEART CATH AND CORONARY ANGIOGRAPHY;  Surgeon: Laurey Morale, MD;  Location: [facility] INVASIVE CV LAB;  Service: Cardiovascular;  Laterality: N/A;   Social History   Tobacco Use  Smoking Status Never  Smokeless Tobacco Never    Pam  Tally Joe 02/06/2022, 4:42 PM

## 2022-02-13 ENCOUNTER — Encounter: Payer: Self-pay | Admitting: Internal Medicine

## 2022-02-15 ENCOUNTER — Encounter: Payer: Self-pay | Admitting: Internal Medicine

## 2022-02-19 NOTE — Progress Notes (Signed)
[facility] PREP Weekly Session  Patient Details  Name: Jason Stevenson MRN: [id number] Date of Birth: 05-10-1985 Age: 36 y.o. PCP: Ladell Pier, MD  Vitals:   02/18/22 1430  Weight: 159 lb (72.1 kg)     [facility] Weekly seesion - 02/19/22 1700       [facility] "PREP" Location   [facility] "PREP" [facility]      Weekly Session   Topic Discussed Importance of resistance training;Other ways to be active    Minutes exercised this week 30 minutes    Classes attended to date [place] 02/19/2022, 5:18 PM

## 2022-02-25 ENCOUNTER — Other Ambulatory Visit: Payer: Self-pay | Admitting: Physical Medicine & Rehabilitation

## 2022-02-25 ENCOUNTER — Other Ambulatory Visit (HOSPITAL_COMMUNITY): Payer: Self-pay | Admitting: Family Medicine

## 2022-02-25 DIAGNOSIS — Z794 Long term (current) use of insulin: Secondary | ICD-10-CM

## 2022-02-26 NOTE — Progress Notes (Signed)
[facility] PREP Weekly Session  Patient Details  Name: Jason Stevenson MRN: [id number] Date of Birth: Jul 31, 1985 Age: 36 y.o. PCP: Marcine Matar, MD  Vitals:   02/25/22 1430  Weight: 164 lb (74.4 kg)     [facility] Weekly seesion - 02/26/22 1600       [facility] "PREP" Location   [facility] "PREP" Location [facility]      Weekly Session   Topic Discussed Healthy eating tips    Minutes exercised this week 65 minutes    Classes attended to date 5             Pam Jerral Bonito 02/26/2022, 4:40 PM

## 2022-02-27 ENCOUNTER — Encounter: Payer: Self-pay | Admitting: Internal Medicine

## 2022-02-28 ENCOUNTER — Other Ambulatory Visit: Payer: Self-pay | Admitting: Internal Medicine

## 2022-02-28 ENCOUNTER — Telehealth: Payer: Self-pay | Admitting: Cardiology

## 2022-02-28 ENCOUNTER — Ambulatory Visit (INDEPENDENT_AMBULATORY_CARE_PROVIDER_SITE_OTHER): Payer: Medicaid Other

## 2022-02-28 DIAGNOSIS — I428 Other cardiomyopathies: Secondary | ICD-10-CM

## 2022-02-28 DIAGNOSIS — Z794 Long term (current) use of insulin: Secondary | ICD-10-CM

## 2022-02-28 LAB — CUP PACEART REMOTE DEVICE CHECK
Battery Remaining Longevity: 131 mo
Battery Voltage: 3.04 V
Brady Statistic RV Percent Paced: 0.01 %
Date Time Interrogation Session: 20231116001803
HighPow Impedance: 64 Ohm
Implantable Lead Connection Status: 753985
Implantable Lead Implant Date: 20230515
Implantable Lead Location: 753860
Implantable Pulse Generator Implant Date: 20230515
Lead Channel Impedance Value: 342 Ohm
Lead Channel Impedance Value: 456 Ohm
Lead Channel Pacing Threshold Amplitude: 0.75 V
Lead Channel Pacing Threshold Pulse Width: 0.4 ms
Lead Channel Sensing Intrinsic Amplitude: 3.125 mV
Lead Channel Sensing Intrinsic Amplitude: 3.125 mV
Lead Channel Setting Pacing Amplitude: 1.25 V
Lead Channel Setting Pacing Pulse Width: 0.4 ms
Lead Channel Setting Sensing Sensitivity: 0.3 mV
Zone Setting Status: 755011
Zone Setting Status: 755011

## 2022-02-28 MED ORDER — PREGABALIN 300 MG PO CAPS: 300.0000 mg | ORAL_CAPSULE | Freq: Every day | ORAL | 5 refills | Status: DC

## 2022-02-28 MED ORDER — DULOXETINE HCL 30 MG PO CPEP: 30.0000 mg | ORAL_CAPSULE | Freq: Every day | ORAL | 5 refills | Status: DC

## 2022-02-28 NOTE — Telephone Encounter (Signed)
Transmission received and shows normal device function. PAtient called and made aware.

## 2022-02-28 NOTE — Telephone Encounter (Signed)
  1. Has your device fired? No   2. Is you device beeping? No   3. Are you experiencing draining or swelling at device site? No   4. Are you calling to see if we received your device transmission? Yes  5. Have you passed out? No     Please route to Device Clinic Pool  

## 2022-03-04 ENCOUNTER — Ambulatory Visit: Payer: Medicaid Other | Attending: Cardiology | Admitting: *Deleted

## 2022-03-04 DIAGNOSIS — I513 Intracardiac thrombosis, not elsewhere classified: Secondary | ICD-10-CM | POA: Insufficient documentation

## 2022-03-04 DIAGNOSIS — I639 Cerebral infarction, unspecified: Secondary | ICD-10-CM | POA: Diagnosis not present

## 2022-03-04 DIAGNOSIS — Z7901 Long term (current) use of anticoagulants: Secondary | ICD-10-CM | POA: Insufficient documentation

## 2022-03-04 DIAGNOSIS — Z5181 Encounter for therapeutic drug level monitoring: Secondary | ICD-10-CM | POA: Insufficient documentation

## 2022-03-04 LAB — POCT INR: INR: 1.7 — AB (ref 2.0–3.0)

## 2022-03-04 NOTE — Patient Instructions (Signed)
Description   Today take 1.5 tablets then continue taking Warfarin 1.5 tablets daily except for 1 tablet on Mondays, Wednesday and Fridays. Stay consistent with leafy veggies. Recheck INR in 3 weeks. Coumadin Clinic [phone]

## 2022-03-06 NOTE — Progress Notes (Signed)
[facility] PREP Weekly Session  Patient Details  Name: Jason Stevenson MRN: [id number] Date of Birth: [birth year] Age: 36 y.o. PCP: Marcine Matar, MD  Vitals:   03/04/22 1430  Weight: 164 lb 3.2 oz (74.5 kg)     [facility] Weekly seesion - 03/06/22 1600       [facility] "PREP" Location   [facility] "PREP" Location [facility]      Weekly Session   Topic Discussed Health habits    Minutes exercised this week 60 minutes    Classes attended to date 8             Bonnye Fava 03/06/2022, 4:39 PM

## 2022-03-10 ENCOUNTER — Encounter: Payer: Self-pay | Admitting: Internal Medicine

## 2022-03-11 ENCOUNTER — Telehealth: Payer: Self-pay | Admitting: Internal Medicine

## 2022-03-11 ENCOUNTER — Ambulatory Visit
Admission: EM | Admit: 2022-03-11 | Discharge: 2022-03-11 | Disposition: A | Discharge status: Home / Self Care | Payer: Medicaid Other | Attending: Nurse Practitioner | Admitting: Nurse Practitioner

## 2022-03-11 DIAGNOSIS — Z7984 Long term (current) use of oral hypoglycemic drugs: Secondary | ICD-10-CM | POA: Insufficient documentation

## 2022-03-11 DIAGNOSIS — I110 Hypertensive heart disease with heart failure: Secondary | ICD-10-CM | POA: Diagnosis not present

## 2022-03-11 DIAGNOSIS — Z79899 Other long term (current) drug therapy: Secondary | ICD-10-CM | POA: Diagnosis not present

## 2022-03-11 DIAGNOSIS — Z8616 Personal history of COVID-19: Secondary | ICD-10-CM | POA: Insufficient documentation

## 2022-03-11 DIAGNOSIS — I639 Cerebral infarction, unspecified: Secondary | ICD-10-CM | POA: Diagnosis not present

## 2022-03-11 DIAGNOSIS — R110 Nausea: Secondary | ICD-10-CM | POA: Diagnosis not present

## 2022-03-11 DIAGNOSIS — Z7901 Long term (current) use of anticoagulants: Secondary | ICD-10-CM | POA: Diagnosis not present

## 2022-03-11 DIAGNOSIS — E1159 Type 2 diabetes mellitus with other circulatory complications: Secondary | ICD-10-CM | POA: Diagnosis not present

## 2022-03-11 DIAGNOSIS — Z1152 Encounter for screening for COVID-19: Secondary | ICD-10-CM | POA: Insufficient documentation

## 2022-03-11 DIAGNOSIS — R109 Unspecified abdominal pain: Secondary | ICD-10-CM | POA: Insufficient documentation

## 2022-03-11 DIAGNOSIS — I5022 Chronic systolic (congestive) heart failure: Secondary | ICD-10-CM | POA: Diagnosis not present

## 2022-03-11 DIAGNOSIS — Z794 Long term (current) use of insulin: Secondary | ICD-10-CM | POA: Diagnosis not present

## 2022-03-11 DIAGNOSIS — R197 Diarrhea, unspecified: Secondary | ICD-10-CM | POA: Diagnosis not present

## 2022-03-11 NOTE — ED Triage Notes (Signed)
Pt presents to the office for abdominal pain and nausea. Pt reports this started 2-3 days ago but he feels better today.

## 2022-03-11 NOTE — ED Provider Notes (Signed)
[facility]    CSN: [id number] Arrival date & time: 03/11/22  1812      History   Chief Complaint Chief Complaint  Patient presents with   Diarrhea   Abdominal Pain    HPI Jason Stevenson is a 36 y.o. male presents for evaluation of nausea and diarrhea.  Patient reports 2 to 3 days of intermittent nausea without vomiting.  Endorses 2-3 episodes of nonbloody partially formed/watery diarrhea.  Has some abdominal cramping prior to diarrhea but otherwise denies abdominal pain or bloating.  No fevers, URI symptoms.  He is able to eat and drink without issue.  No dysuria.  States his symptoms are improving.  He does have a complex medical history including diabetes and heart failure.  Denies any change in his diet or recent travel.  No history of IBS or Crohn's.  He has not taken any OTC medications for symptoms.  He is vaccinated for flu and COVID.  No other concerns at this time.   Diarrhea Associated symptoms: abdominal pain   Abdominal Pain Associated symptoms: diarrhea and nausea     Past Medical History:  Diagnosis Date   Chronic systolic CHF (congestive heart failure) ([place]) 06/18/2015   Diabetes mellitus without complication ([place])    Hemorrhagic stroke ([place])    Hyperlipidemia    Hypertension    Nonischemic cardiomyopathy ([place]) Noted as early as 2007   Per chart review (cards consult note 2011), EF of 40% in 2007, down to 20-25% in 2011    Patient Active Problem List   Diagnosis Date Noted   Nonischemic cardiomyopathy ([place]) 08/27/2021   LADA (latent autoimmune diabetes in adults), 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   Poorly controlled type 2 diabetes mellitus with circulatory disorder ([place]) 03/19/2021   Ischemic cardiomyopathy    Syncope 12/14/2020   Chest pain 10/30/2020   History of cerebrovascular accident (CVA) with residual deficit 10/27/2020   Protein-calorie malnutrition, severe 09/22/2020   Acute CVA (cerebrovascular accident) ([place]) 09/21/2020    Near syncope 09/21/2020   Encounter for monitoring Coumadin therapy 09/20/2020   LV (left ventricular) mural thrombus 09/13/2020   Seizure ([place]) 09/13/2020   Neurological deficit present 09/12/2020   History of COVID-19 09/12/2020   Intracerebral hemorrhage 09/12/2020   Acute cerebrovascular accident (CVA) ([place]) 09/12/2020   COVID-19 08/23/2020   Noncompliance with medication treatment due to intermittent use of medication 10/12/2018   Dyslipidemia 30/86/5784   Chronic systolic CHF (congestive heart failure) ([place]) 06/18/2015   Needs flu shot 12/23/2013   Non-ischemic cardiomyopathy ([place]) 11/06/2012   HTN (hypertension) 11/06/2012   HLD (hyperlipidemia) 11/06/2012    Past Surgical History:  Procedure Laterality Date   ICD IMPLANT N/A 08/27/2021   Procedure: ICD IMPLANT;  Surgeon: Vickie Epley, MD;  Location: [facility];  Service: Cardiovascular;  Laterality: N/A;   None     RIGHT HEART CATH  06/21/2021   [facility]   RIGHT/LEFT HEART CATH AND CORONARY ANGIOGRAPHY N/A 11/02/2020   Procedure: RIGHT/LEFT HEART CATH AND CORONARY ANGIOGRAPHY;  Surgeon: Larey Dresser, MD;  Location: [place] [facility];  Service: Cardiovascular;  Laterality: N/A;       Home Medications    Prior to Admission medications   Medication Sig Start Date End Date Taking? Authorizing Provider  Accu-Chek Softclix Lancets lancets Use to check blood sugar three times daily. Patient not taking: Reported on 01/31/2022 04/05/21   Ladell Pier, MD  acetaminophen (TYLENOL) 325 MG tablet Take 1-2 tablets (  325-650 mg total) by mouth every 4 (four) hours as needed for mild pain. 08/28/21   Shirley Friar, PA-C  atorvastatin (LIPITOR) 80 MG tablet TAKE 1 TABLET (80 MG TOTAL) BY MOUTH DAILY (EVENING) 01/11/22   Ladell Pier, MD  Blood Glucose Monitoring Suppl (ACCU-CHEK GUIDE) w/Device KIT Use to check blood sugar three times daily. Patient not taking: Reported on 01/31/2022 04/05/21    Ladell Pier, MD  Blood Pressure Monitor DEVI Use as directed to check home blood pressure 2-3 times a week 10/12/18   Ladell Pier, MD  Continuous Blood Gluc Receiver (FREESTYLE LIBRE 2 READER) DEVI 1 each by Does not apply route daily. 04/19/21   Philemon Kingdom, MD  Continuous Blood Gluc Sensor (FREESTYLE LIBRE 2 SENSOR) MISC USE EVERY 14 (FOURTEEN) DAYS. 11/08/21   Ladell Pier, MD  CORLANOR 5 MG TABS tablet TAKE 1 TABLET (5 MG TOTAL) BY MOUTH 2 (TWO) TIMES DAILY WITH A MEAL (AM+EVENING) 02/25/22   Milford, Maricela Bo, FNP  dapagliflozin propanediol (FARXIGA) 5 MG TABS tablet Take 1 tablet (5 mg total) by mouth daily before breakfast. 10/03/21   Larey Dresser, MD  digoxin (LANOXIN) 0.125 MG tablet Take 1 tablet (0.125 mg total) by mouth daily. Please request Cardiologist for future refills. 12/14/21   Larey Dresser, MD  DULoxetine (CYMBALTA) 30 MG capsule Take 1 capsule (30 mg total) by mouth daily. 02/28/22   Ladell Pier, MD  ENTRESTO 49-51 MG TAKE 1 TABLET BY MOUTH 2 (TWO) TIMES DAILY (AM+EVENING) 01/11/22   Larey Dresser, MD  Evolocumab (REPATHA SURECLICK) 833 MG/ML SOAJ Inject 140 mg into the skin every 14 (fourteen) days. 11/16/21   Larey Dresser, MD  ezetimibe (ZETIA) 10 MG tablet Take 1 tablet (10 mg total) by mouth daily. 12/14/21   Larey Dresser, MD  furosemide (LASIX) 20 MG tablet Take 1 tablet (20 mg total) by mouth 3 (three) times a week. Mondays Wednesdays and Friday 11/02/21   Rafael Bihari, FNP  glucose blood (ACCU-CHEK GUIDE) test strip Use to check blood sugar three times daily. 04/05/21   Ladell Pier, MD  insulin aspart (FIASP FLEXTOUCH) 100 UNIT/ML FlexTouch Pen Inject 5-8 Units into the skin 3 (three) times daily before meals. 04/19/21   Philemon Kingdom, MD  Insulin Pen Needle 32G X 4 MM MISC Use to inject insulin. 04/05/21   Ladell Pier, MD  Insulin Syringe-Needle U-100 (RELION INSULIN SYRINGE) 31G X 15/64" 0.3 ML MISC Use to  inject insulin daily. 07/13/18   Ladell Pier, MD  levETIRAcetam (KEPPRA) 750 MG tablet Take 2 tablets (1,500 mg total) by mouth 2 (two) times daily. 06/26/21 09/19/22  Penumalli, Earlean Polka, MD  Menthol, Topical Analgesic, (ICY HOT BACK EX) Apply 1 application topically as needed (neuropathy). Icy hot cream    [provider]  metFORMIN (GLUCOPHAGE-XR) 500 MG 24 hr tablet Take 1 tablet (500 mg total) by mouth 2 (two) times daily with a meal. 07/17/21   Philemon Kingdom, MD  metoprolol (TOPROL-XL) 200 MG 24 hr tablet TAKE 0.5 TABLETS (100 MG TOTAL) BY MOUTH DAILY. TAKE WITH OR IMMEDIATELY FOLLOWING A MEAL (EVENING) 12/14/21   Larey Dresser, MD  Oxcarbazepine (TRILEPTAL) 300 MG tablet Take 2 tablets (600 mg total) by mouth 2 (two) times daily. 06/26/21 09/19/22  Penumalli, Earlean Polka, MD  phenytoin (DILANTIN) 100 MG ER capsule Take 1 capsule (100 mg total) by mouth 3 (three) times daily. 06/26/21 09/19/22  Penumalli,  Earlean Polka, MD  potassium chloride SA (KLOR-CON M) 20 MEQ tablet Take 1 tablet (20 mEq total) by mouth 3 (three) times a week. Take every Mon, Wed and Fri with Lasix 11/02/21   [place], Maricela Bo, FNP  pregabalin (LYRICA) 300 MG capsule Take 1 capsule (300 mg total) by mouth at bedtime. 02/28/22   Ladell Pier, MD  spironolactone (ALDACTONE) 25 MG tablet Take 1 tablet (25 mg total) by mouth at bedtime. 04/05/21   Ladell Pier, MD  TRESIBA FLEXTOUCH 200 UNIT/ML FlexTouch Pen INJECT 20 UNITS INTO THE SKIN DAILY. 01/29/22   Philemon Kingdom, MD  warfarin (COUMADIN) 5 MG tablet Take 1-1.5 tablets (5-7.5 mg total) by mouth daily. 01/14/22   Camnitz, Ocie Doyne, MD    Family History Family History  Problem Relation Age of Onset   Stroke Mother    Diabetes Mother    Hypertension Mother    Stroke Maternal Aunt    Heart attack Neg Hx     Social History Social History   Tobacco Use   Smoking status: Never   Smokeless tobacco: Never  Substance Use Topics   Alcohol use: Not  Currently    Comment: "occasional" when "hanging out with the wrong people" No recent use.   Drug use: Not Currently    Types: Marijuana    Comment: occasional, last 2013     Allergies   Patient has no known allergies.   Review of Systems Review of Systems  Gastrointestinal:  Positive for abdominal pain, diarrhea and nausea.     Physical Exam Triage Vital Signs ED Triage Vitals [03/11/22 1843]  Enc Vitals Group     BP 120/78     Pulse Rate 75     Resp 16     Temp 98.8 F (37.1 C)     Temp src      SpO2 98 %     Weight      Height      Head Circumference      Peak Flow      Pain Score      Pain Loc      Pain Edu?      Excl. in [place]?    No data found.  Updated Vital Signs BP 120/78 (BP Location: Left Arm)   Pulse 75   Temp 98.8 F (37.1 C)   Resp 16   SpO2 98%   Visual Acuity Right Eye Distance:   Left Eye Distance:   Bilateral Distance:    Right Eye Near:   Left Eye Near:    Bilateral Near:     Physical Exam Vitals and nursing note reviewed.  Constitutional:      General: He is not in acute distress.    Appearance: He is well-developed. He is not ill-appearing, toxic-appearing or diaphoretic.  HENT:     Head: Normocephalic and atraumatic.  Eyes:     Pupils: Pupils are equal, round, and reactive to light.  Cardiovascular:     Rate and Rhythm: Normal rate.  Pulmonary:     Effort: Pulmonary effort is normal.  Abdominal:     General: Abdomen is flat. Bowel sounds are normal. There is no distension.     Palpations: There is no mass.     Tenderness: There is no abdominal tenderness. There is no guarding or rebound.     Hernia: No hernia is present.  Skin:    General: Skin is warm and dry.  Neurological:  General: No focal deficit present.     Mental Status: He is alert and oriented to person, place, and time.  Psychiatric:        Mood and Affect: Mood normal.        Behavior: Behavior normal.      UC Treatments / Results  Labs (all  labs ordered are listed, but only abnormal results are displayed) Labs Reviewed - No data to display  EKG   Radiology No results found.  Procedures Procedures (including critical care time)  Medications Ordered in UC Medications - No data to display  Initial Impression / Assessment and Plan / UC Course  I have reviewed the triage vital signs and the nursing notes.  Pertinent labs & imaging results that were available during my care of the patient were reviewed by me and considered in my medical decision making (see chart for details).     Reviewed exam and symptoms with patient.  No red flags on exam.  Abdominal exam is benign Discussed viral enteritis as cause of symptoms.  Patient reports symptoms are improving Brat diet advance as tolerated Fluids with instruction to follow his cardiologist recommendation for fluid intake given his history of CHF He is to follow-up with his PCP 2 to 3 days for recheck ER for any worsening symptoms Final Clinical Impressions(s) / UC Diagnoses   Final diagnoses:  Nausea without vomiting  Diarrhea, unspecified type   Discharge Instructions   None    ED Prescriptions   None    PDMP not reviewed this encounter.   Ardelle Balls, NP 03/11/22 1904

## 2022-03-11 NOTE — Discharge Instructions (Addendum)
Brat diet and advance as tolerated Stay hydrated Follow-up with your PCP 2 to 3 days for recheck ER for any worsening symptoms

## 2022-03-12 LAB — RESP PANEL BY RT-PCR (FLU A&B, COVID) ARPGX2
Influenza A by PCR: NEGATIVE
Influenza B by PCR: NEGATIVE
SARS Coronavirus 2 by RT PCR: NEGATIVE

## 2022-03-13 NOTE — Telephone Encounter (Signed)
Scheduled an apt for patient- MyChart apt. 03/19/2022 at [facility]

## 2022-03-18 ENCOUNTER — Encounter: Payer: Self-pay | Admitting: Internal Medicine

## 2022-03-18 ENCOUNTER — Other Ambulatory Visit: Payer: Medicaid Other

## 2022-03-19 ENCOUNTER — Emergency Department (HOSPITAL_COMMUNITY): Payer: Medicaid Other

## 2022-03-19 ENCOUNTER — Encounter (HOSPITAL_COMMUNITY): Payer: Self-pay

## 2022-03-19 ENCOUNTER — Other Ambulatory Visit: Payer: Self-pay

## 2022-03-19 ENCOUNTER — Telehealth (HOSPITAL_BASED_OUTPATIENT_CLINIC_OR_DEPARTMENT_OTHER): Payer: Medicaid Other | Admitting: Internal Medicine

## 2022-03-19 ENCOUNTER — Emergency Department (HOSPITAL_COMMUNITY)
Admission: EM | Admit: 2022-03-19 | Discharge: 2022-03-19 | Disposition: A | Discharge status: Home / Self Care | Payer: Medicaid Other | Attending: Emergency Medicine | Admitting: Emergency Medicine

## 2022-03-19 DIAGNOSIS — E1169 Type 2 diabetes mellitus with other specified complication: Secondary | ICD-10-CM | POA: Diagnosis not present

## 2022-03-19 DIAGNOSIS — Z79899 Other long term (current) drug therapy: Secondary | ICD-10-CM | POA: Diagnosis not present

## 2022-03-19 DIAGNOSIS — E119 Type 2 diabetes mellitus without complications: Secondary | ICD-10-CM | POA: Diagnosis not present

## 2022-03-19 DIAGNOSIS — I10 Essential (primary) hypertension: Secondary | ICD-10-CM | POA: Insufficient documentation

## 2022-03-19 DIAGNOSIS — R079 Chest pain, unspecified: Secondary | ICD-10-CM | POA: Diagnosis not present

## 2022-03-19 DIAGNOSIS — R072 Precordial pain: Secondary | ICD-10-CM | POA: Diagnosis not present

## 2022-03-19 DIAGNOSIS — Z7984 Long term (current) use of oral hypoglycemic drugs: Secondary | ICD-10-CM | POA: Diagnosis not present

## 2022-03-19 DIAGNOSIS — R197 Diarrhea, unspecified: Secondary | ICD-10-CM | POA: Diagnosis not present

## 2022-03-19 DIAGNOSIS — N521 Erectile dysfunction due to diseases classified elsewhere: Secondary | ICD-10-CM

## 2022-03-19 DIAGNOSIS — Z794 Long term (current) use of insulin: Secondary | ICD-10-CM | POA: Insufficient documentation

## 2022-03-19 DIAGNOSIS — R1084 Generalized abdominal pain: Secondary | ICD-10-CM | POA: Diagnosis not present

## 2022-03-19 DIAGNOSIS — R109 Unspecified abdominal pain: Secondary | ICD-10-CM | POA: Diagnosis not present

## 2022-03-19 DIAGNOSIS — N2889 Other specified disorders of kidney and ureter: Secondary | ICD-10-CM | POA: Diagnosis not present

## 2022-03-19 LAB — BASIC METABOLIC PANEL
Anion gap: 7 (ref 5–15)
BUN: 13 mg/dL (ref 6–20)
CO2: 27 mmol/L (ref 22–32)
Calcium: 8.9 mg/dL (ref 8.9–10.3)
Chloride: 105 mmol/L (ref 98–111)
Creatinine, Ser: 0.87 mg/dL (ref 0.61–1.24)
GFR, Estimated: >60 mL/min (ref >60)
Glucose, Bld: 157 mg/dL — ABNORMAL HIGH (ref 70–99)
Potassium: 4.1 mmol/L (ref 3.5–5.1)
Sodium: 139 mmol/L (ref 135–145)

## 2022-03-19 LAB — HEPATIC FUNCTION PANEL
ALT: 36 U/L (ref 0–44)
AST: 33 U/L (ref 15–41)
Albumin: 3.9 g/dL (ref 3.5–5.0)
Alkaline Phosphatase: 95 U/L (ref 38–126)
Bilirubin, Direct: <0.1 mg/dL (ref 0.0–0.2)
Total Bilirubin: 0.4 mg/dL (ref 0.3–1.2)
Total Protein: 7.1 g/dL (ref 6.5–8.1)

## 2022-03-19 LAB — CBC
HCT: 37.5 % — ABNORMAL LOW (ref 39.0–52.0)
Hemoglobin: 12.6 g/dL — ABNORMAL LOW (ref 13.0–17.0)
MCH: 29.7 pg (ref 26.0–34.0)
MCHC: 33.6 g/dL (ref 30.0–36.0)
MCV: 88.4 fL (ref 80.0–100.0)
Platelets: 168 10*3/uL (ref 150–400)
RBC: 4.24 MIL/uL (ref 4.22–5.81)
RDW: 12.3 % (ref 11.5–15.5)
WBC: 4.5 10*3/uL (ref 4.0–10.5)
nRBC: 0 % (ref 0.0–0.2)

## 2022-03-19 LAB — LIPASE, BLOOD: Lipase: 68 U/L — ABNORMAL HIGH (ref 11–51)

## 2022-03-19 LAB — BRAIN NATRIURETIC PEPTIDE: B Natriuretic Peptide: 27.3 pg/mL (ref 0.0–100.0)

## 2022-03-19 LAB — TROPONIN I (HIGH SENSITIVITY)
Troponin I (High Sensitivity): 37 ng/L — ABNORMAL HIGH (ref <18)
Troponin I (High Sensitivity): 30 ng/L — ABNORMAL HIGH (ref <18)

## 2022-03-19 MED ORDER — IOHEXOL 350 MG/ML SOLN
80.0000 mL | Freq: Once | INTRAVENOUS | Status: AC | PRN
  Administered 2022-03-19: 80 mL via INTRAVENOUS

## 2022-03-19 MED ORDER — LOPERAMIDE HCL 2 MG PO CAPS: 2.0000 mg | ORAL_CAPSULE | Freq: Four times a day (QID) | ORAL | 0 refills | Status: DC | PRN

## 2022-03-19 MED ORDER — DICYCLOMINE HCL 20 MG PO TABS: 20.0000 mg | ORAL_TABLET | Freq: Two times a day (BID) | ORAL | 0 refills | Status: DC

## 2022-03-19 MED ORDER — SILDENAFIL CITRATE 50 MG PO TABS: ORAL_TABLET | ORAL | 6 refills | Status: DC

## 2022-03-19 NOTE — ED Provider Triage Note (Signed)
Emergency Medicine Provider Triage Evaluation Note  Jason Stevenson , a 36 y.o. male  was evaluated in triage.  Pt complains of nausea and diarrhea for two weeks. He has been feeling generally unwell. Started having left sided chest pain today that has been intermittent. He has hx of HFrEF trying to get on transplant list.  Review of Systems  Positive:  Negative:   Physical Exam  BP 123/84 (BP Location: Right Arm)   Pulse (!) 56   Temp 98.7 F (37.1 C) (Oral)   Resp 16   SpO2 100%  Gen:   Awake, no distress   Resp:  Normal effort  MSK:   Moves extremities without difficulty  Other:  Abdomen soft, nontender.   Medical Decision Making  Medically screening exam initiated at 4:30 PM.  Appropriate orders placed.  Advait Buice was informed that the remainder of the evaluation will be completed by another provider, this initial triage assessment does not replace that evaluation, and the importance of remaining in the ED until their evaluation is complete.     Claudie Leach, [place] 03/19/22 [phone]

## 2022-03-19 NOTE — ED Triage Notes (Signed)
Patient complains of not feeling good for 1-2 weeks. Nausea and diarrhea and seen at urgent care for same. Has CHF and ICD in place. Seen at [facility] by [facility] and reports recent INR low. Patient complains of earlier slight CP, ongoing nausea and diarrhea with some SOB. Alert and oriented

## 2022-03-19 NOTE — Discharge Instructions (Addendum)
You for a few medications to help with your loose stool and abdominal cramping.  We have also given you some stool cups.  Please follow-up with your primary care provider to run some stool studies.  Sure to keep your follow-up appointment with [facility] tomorrow.  Transplant.  Discussed with cardiology.  Please make sure to follow-up outpatient for your pain.  Return for new or worsening symptoms.

## 2022-03-19 NOTE — ED Provider Notes (Signed)
[facility] EMERGENCY DEPARTMENT Provider Note   CSN: [id number] Arrival date & time: 03/19/22  1538    History    Jason Stevenson is a 36 y.o. male with history of CHF, nonischemic cardiomyopathy EF less than 20 s/p ICD, chronic anticoagulation, prior CVA, DM, seizure-like activity here for evaluation multiple complaints.  States he has had loose stool over the last week and a half.  Some nausea without vomiting.  4-6 episodes daily.  No recent antibiotics or travel.  No sick contacts.  Get some generalized abdominal pain however no focal pain.  No melena bright per rectum.  Does not radiate.  No chronic NSAID use, EtOH use.  No dysuria or hematuria.  Also noted earlier today he developed some left-sided chest tightness.  Has some degree chronically however this is worse than normal.  Felt he could not take a deep breath.  Feels like his breathing is "heavy."  No fever, congestion or rhinorrhea.  No back pain.  No lower extremity pain or swelling.  States he is compliant with his home medications however has missed a few doses "here and there."  He has appointment tomorrow to see [facility] about a heart transplant.  He follows with the [facility] cardiology group at baseline. No ICD firings.    HPI     Home Medications Prior to Admission medications   Medication Sig Start Date End Date Taking? Authorizing Provider  dicyclomine (BENTYL) 20 MG tablet Take 1 tablet (20 mg total) by mouth 2 (two) times daily. 03/19/22  Yes Jakobe Blau A, PA-C  loperamide (IMODIUM) 2 MG capsule Take 1 capsule (2 mg total) by mouth 4 (four) times daily as needed for diarrhea or loose stools. 03/19/22  Yes Kristoff Coonradt A, PA-C  acetaminophen (TYLENOL) 325 MG tablet Take 1-2 tablets (325-650 mg total) by mouth every 4 (four) hours as needed for mild pain. 08/28/21   Graciella Freer, PA-C  atorvastatin (LIPITOR) 80 MG tablet TAKE 1 TABLET (80 MG TOTAL) BY MOUTH DAILY (EVENING) 01/11/22    Marcine Matar, MD  Blood Pressure Monitor DEVI Use as directed to check home blood pressure 2-3 times a week 10/12/18   Marcine Matar, MD  Continuous Blood Gluc Receiver (FREESTYLE LIBRE 2 READER) DEVI 1 each by Does not apply route daily. 04/19/21   Carlus Pavlov, MD  Continuous Blood Gluc Sensor (FREESTYLE LIBRE 2 SENSOR) MISC USE EVERY 14 (FOURTEEN) DAYS. 11/08/21   Marcine Matar, MD  CORLANOR 5 MG TABS tablet TAKE 1 TABLET (5 MG TOTAL) BY MOUTH 2 (TWO) TIMES DAILY WITH A MEAL (AM+EVENING) 02/25/22   Milford, Anderson Malta, FNP  dapagliflozin propanediol (FARXIGA) 5 MG TABS tablet Take 1 tablet (5 mg total) by mouth daily before breakfast. 10/03/21   Laurey Morale, MD  digoxin (LANOXIN) 0.125 MG tablet Take 1 tablet (0.125 mg total) by mouth daily. Please request Cardiologist for future refills. 12/14/21   Laurey Morale, MD  DULoxetine (CYMBALTA) 30 MG capsule Take 1 capsule (30 mg total) by mouth daily. 02/28/22   Marcine Matar, MD  ENTRESTO 49-51 MG TAKE 1 TABLET BY MOUTH 2 (TWO) TIMES DAILY (AM+EVENING) 01/11/22   Laurey Morale, MD  Evolocumab (REPATHA SURECLICK) 140 MG/ML SOAJ Inject 140 mg into the skin every 14 (fourteen) days. 11/16/21   Laurey Morale, MD  ezetimibe (ZETIA) 10 MG tablet Take 1 tablet (10 mg total) by mouth daily. 12/14/21   Laurey Morale, MD  furosemide (  LASIX) 20 MG tablet Take 1 tablet (20 mg total) by mouth 3 (three) times a week. Mondays Wednesdays and Friday 11/02/21   Jacklynn Ganong, FNP  glucose blood (ACCU-CHEK GUIDE) test strip Use to check blood sugar three times daily. 04/05/21   Marcine Matar, MD  insulin aspart (FIASP FLEXTOUCH) 100 UNIT/ML FlexTouch Pen Inject 5-8 Units into the skin 3 (three) times daily before meals. 04/19/21   Carlus Pavlov, MD  Insulin Pen Needle 32G X 4 MM MISC Use to inject insulin. 04/05/21   Marcine Matar, MD  Insulin Syringe-Needle U-100 (RELION INSULIN SYRINGE) 31G X 15/64" 0.3 ML MISC Use to  inject insulin daily. 07/13/18   Marcine Matar, MD  levETIRAcetam (KEPPRA) 750 MG tablet Take 2 tablets (1,500 mg total) by mouth 2 (two) times daily. 06/26/21 09/19/22  Penumalli, Glenford Bayley, MD  Menthol, Topical Analgesic, (ICY HOT BACK EX) Apply 1 application topically as needed (neuropathy). Icy hot cream    [provider]  metFORMIN (GLUCOPHAGE-XR) 500 MG 24 hr tablet Take 1 tablet (500 mg total) by mouth 2 (two) times daily with a meal. 07/17/21   Carlus Pavlov, MD  metoprolol (TOPROL-XL) 200 MG 24 hr tablet TAKE 0.5 TABLETS (100 MG TOTAL) BY MOUTH DAILY. TAKE WITH OR IMMEDIATELY FOLLOWING A MEAL (EVENING) 12/14/21   Laurey Morale, MD  Oxcarbazepine (TRILEPTAL) 300 MG tablet Take 2 tablets (600 mg total) by mouth 2 (two) times daily. 06/26/21 09/19/22  Penumalli, Glenford Bayley, MD  phenytoin (DILANTIN) 100 MG ER capsule Take 1 capsule (100 mg total) by mouth 3 (three) times daily. 06/26/21 09/19/22  Penumalli, Glenford Bayley, MD  potassium chloride SA (KLOR-CON M) 20 MEQ tablet Take 1 tablet (20 mEq total) by mouth 3 (three) times a week. Take every Mon, Wed and Fri with Lasix 11/02/21   Milford, Anderson Malta, FNP  pregabalin (LYRICA) 300 MG capsule Take 1 capsule (300 mg total) by mouth at bedtime. 02/28/22   Marcine Matar, MD  sildenafil (VIAGRA) 50 MG tablet 1 tab p.o. half hour prior to intercourse PRN.  Limit use to 1 tab/24 hr 03/19/22   Marcine Matar, MD  spironolactone (ALDACTONE) 25 MG tablet Take 1 tablet (25 mg total) by mouth at bedtime. 04/05/21   Marcine Matar, MD  TRESIBA FLEXTOUCH 200 UNIT/ML FlexTouch Pen INJECT 20 UNITS INTO THE SKIN DAILY. 01/29/22   Carlus Pavlov, MD  warfarin (COUMADIN) 5 MG tablet Take 1-1.5 tablets (5-7.5 mg total) by mouth daily. 01/14/22   Camnitz, Andree Coss, MD      Allergies    Patient has no known allergies.    Review of Systems   Review of Systems  Constitutional: Negative.   HENT: Negative.    Respiratory:  Positive for chest  tightness and shortness of breath. Negative for apnea, cough, choking, wheezing and stridor.   Cardiovascular:  Positive for chest pain. Negative for palpitations and leg swelling.  Gastrointestinal:  Positive for abdominal pain, diarrhea and nausea. Negative for abdominal distention, anal bleeding, blood in stool, constipation, rectal pain and vomiting.  Genitourinary: Negative.   Musculoskeletal: Negative.   Skin: Negative.   Neurological: Negative.   All other systems reviewed and are negative.   Physical Exam Updated Vital Signs BP 131/86   Pulse (!) 57   Temp 98 F (36.7 C) (Oral)   Resp 14   SpO2 100%  Physical Exam Vitals and nursing note reviewed.  Constitutional:      General:  He is not in acute distress.    Appearance: He is well-developed. He is not ill-appearing, toxic-appearing or diaphoretic.  HENT:     Head: Atraumatic.  Eyes:     Pupils: Pupils are equal, round, and reactive to light.  Cardiovascular:     Rate and Rhythm: Normal rate and regular rhythm.  Pulmonary:     Effort: Pulmonary effort is normal. No respiratory distress.     Breath sounds: Normal breath sounds and air entry.     Comments: Clear bilaterally, speaks in full sentences without difficulty Chest:     Comments: Nontender chest wall Old ICD scaring to chest wall Abdominal:     General: Bowel sounds are normal. There is no distension.     Palpations: Abdomen is soft.     Tenderness: There is no abdominal tenderness.     Comments: Soft generalized tenderness  Musculoskeletal:        General: Normal range of motion.     Cervical back: Normal range of motion and neck supple.     Comments: No bony tenderness. Compartments soft. Non tender. No LE swelling  Skin:    General: Skin is warm and dry.     Capillary Refill: Capillary refill takes less than 2 seconds.     Comments: No edema, erythema, warmth  Neurological:     General: No focal deficit present.     Mental Status: He is alert and  oriented to person, place, and time.     Cranial Nerves: Cranial nerves 2-12 are intact.     Sensory: Sensation is intact.     Motor: Motor function is intact.    ED Results / Procedures / Treatments   Labs (all labs ordered are listed, but only abnormal results are displayed) Labs Reviewed  BASIC METABOLIC PANEL - Abnormal; Notable for the following components:      Result Value   Glucose, Bld 157 (*)    All other components within normal limits  CBC - Abnormal; Notable for the following components:   Hemoglobin 12.6 (*)    HCT 37.5 (*)    All other components within normal limits  LIPASE, BLOOD - Abnormal; Notable for the following components:   Lipase 68 (*)    All other components within normal limits  TROPONIN I (HIGH SENSITIVITY) - Abnormal; Notable for the following components:   Troponin I (High Sensitivity) 37 (*)    All other components within normal limits  TROPONIN I (HIGH SENSITIVITY) - Abnormal; Notable for the following components:   Troponin I (High Sensitivity) 30 (*)    All other components within normal limits  GASTROINTESTINAL PANEL BY PCR, STOOL (REPLACES STOOL CULTURE)  C DIFFICILE QUICK SCREEN W PCR REFLEX    HEPATIC FUNCTION PANEL  BRAIN NATRIURETIC PEPTIDE  PROTIME-INR    EKG EKG Interpretation  Date/Time:  Tuesday March 19 2022 16:25:35 EST Ventricular Rate:  67 PR Interval:  154 QRS Duration: 108 QT Interval:  390 QTC Calculation: 412 R Axis:   119 Text Interpretation: Sinus rhythm with occasional Premature ventricular complexes Right axis deviation T wave abnormality, consider inferior ischemia Abnormal ECG When compared with ECG of 21-Nov-2021 08:46, Premature ventricular complexes are now present Confirmed by Dione Booze ([id number]) on 03/19/2022 11:10:31 PM  Radiology CT Angio Chest PE W and/or Wo Contrast  Result Date: 03/19/2022 CLINICAL DATA:  Pulmonary embolism (PE) suspected, high prob; Abdominal pain, acute, nonlocalized abd,  diarrhea,cp EXAM: CT ANGIOGRAPHY CHEST CT ABDOMEN AND PELVIS WITH CONTRAST  TECHNIQUE: Multidetector CT imaging of the chest was performed using the standard protocol during bolus administration of intravenous contrast. Multiplanar CT image reconstructions and MIPs were obtained to evaluate the vascular anatomy. Multidetector CT imaging of the abdomen and pelvis was performed using the standard protocol during bolus administration of intravenous contrast. RADIATION DOSE REDUCTION: This exam was performed according to the departmental dose-optimization program which includes automated exposure control, adjustment of the mA and/or kV according to patient size and/or use of iterative reconstruction technique. CONTRAST:  63mL OMNIPAQUE IOHEXOL 350 MG/ML SOLN COMPARISON:  None Available. FINDINGS: CTA CHEST FINDINGS Cardiovascular: Left chest wall single lead defibrillator in grossly appropriate position. Satisfactory opacification of the pulmonary arteries to the segmental level. No evidence of pulmonary embolism. The main pulmonary artery is normal in caliber. Normal heart size. No significant pericardial effusion. The thoracic aorta is normal in caliber. No atherosclerotic plaque of the thoracic aorta. No coronary artery calcifications. Mediastinum/Nodes: No enlarged mediastinal, hilar, or axillary lymph nodes. Thyroid gland, trachea, and esophagus demonstrate no significant findings. Lungs/Pleura: No focal consolidation. No pulmonary nodule. No pulmonary mass. No pleural effusion. No pneumothorax. Musculoskeletal: No chest wall abnormality. No suspicious lytic or blastic osseous lesions. No acute displaced fracture. Review of the MIP images confirms the above findings. CT ABDOMEN and PELVIS FINDINGS Hepatobiliary: No focal liver abnormality. No gallstones, gallbladder wall thickening, or pericholecystic fluid. No biliary dilatation. Pancreas: No focal lesion. Normal pancreatic contour. No surrounding inflammatory  changes. No main pancreatic ductal dilatation. Spleen: Normal in size without focal abnormality. Adrenals/Urinary Tract: No adrenal nodule bilaterally. Bilateral kidneys enhance symmetrically. Bilateral, left greater than right, renal cortical scarring with possible underlying partial nephrectomy. No hydronephrosis. No hydroureter. The urinary bladder is unremarkable. Stomach/Bowel: Stomach is within normal limits. No evidence of bowel wall thickening or dilatation. Appendix appears normal. Vascular/Lymphatic: No abdominal aorta or iliac aneurysm. Mild atherosclerotic plaque of the aorta and its branches. No abdominal, pelvic, or inguinal lymphadenopathy. Reproductive: Prostate is unremarkable. Other: No intraperitoneal free fluid. No intraperitoneal free gas. No organized fluid collection. Musculoskeletal: No abdominal wall hernia or abnormality. No suspicious lytic or blastic osseous lesions. No acute displaced fracture. Review of the MIP images confirms the above findings. IMPRESSION: 1. No pulmonary embolus. 2. No acute intrathoracic abnormality. 3. No acute intra-abdominal or intrapelvic abnormality. 4. Bilateral, left greater than right, renal cortical scarring with possible underlying partial nephrectomy. No definite cortical mass. 5. Aortic Atherosclerosis (ICD10-I70.0). Electronically Signed   By: Tish Frederickson M.D.   On: 03/19/2022 22:10   CT ABDOMEN PELVIS W CONTRAST  Result Date: 03/19/2022 CLINICAL DATA:  Pulmonary embolism (PE) suspected, high prob; Abdominal pain, acute, nonlocalized abd, diarrhea,cp EXAM: CT ANGIOGRAPHY CHEST CT ABDOMEN AND PELVIS WITH CONTRAST TECHNIQUE: Multidetector CT imaging of the chest was performed using the standard protocol during bolus administration of intravenous contrast. Multiplanar CT image reconstructions and MIPs were obtained to evaluate the vascular anatomy. Multidetector CT imaging of the abdomen and pelvis was performed using the standard protocol during  bolus administration of intravenous contrast. RADIATION DOSE REDUCTION: This exam was performed according to the departmental dose-optimization program which includes automated exposure control, adjustment of the mA and/or kV according to patient size and/or use of iterative reconstruction technique. CONTRAST:  42mL OMNIPAQUE IOHEXOL 350 MG/ML SOLN COMPARISON:  None Available. FINDINGS: CTA CHEST FINDINGS Cardiovascular: Left chest wall single lead defibrillator in grossly appropriate position. Satisfactory opacification of the pulmonary arteries to the segmental level. No evidence of pulmonary embolism. The main pulmonary  artery is normal in caliber. Normal heart size. No significant pericardial effusion. The thoracic aorta is normal in caliber. No atherosclerotic plaque of the thoracic aorta. No coronary artery calcifications. Mediastinum/Nodes: No enlarged mediastinal, hilar, or axillary lymph nodes. Thyroid gland, trachea, and esophagus demonstrate no significant findings. Lungs/Pleura: No focal consolidation. No pulmonary nodule. No pulmonary mass. No pleural effusion. No pneumothorax. Musculoskeletal: No chest wall abnormality. No suspicious lytic or blastic osseous lesions. No acute displaced fracture. Review of the MIP images confirms the above findings. CT ABDOMEN and PELVIS FINDINGS Hepatobiliary: No focal liver abnormality. No gallstones, gallbladder wall thickening, or pericholecystic fluid. No biliary dilatation. Pancreas: No focal lesion. Normal pancreatic contour. No surrounding inflammatory changes. No main pancreatic ductal dilatation. Spleen: Normal in size without focal abnormality. Adrenals/Urinary Tract: No adrenal nodule bilaterally. Bilateral kidneys enhance symmetrically. Bilateral, left greater than right, renal cortical scarring with possible underlying partial nephrectomy. No hydronephrosis. No hydroureter. The urinary bladder is unremarkable. Stomach/Bowel: Stomach is within normal  limits. No evidence of bowel wall thickening or dilatation. Appendix appears normal. Vascular/Lymphatic: No abdominal aorta or iliac aneurysm. Mild atherosclerotic plaque of the aorta and its branches. No abdominal, pelvic, or inguinal lymphadenopathy. Reproductive: Prostate is unremarkable. Other: No intraperitoneal free fluid. No intraperitoneal free gas. No organized fluid collection. Musculoskeletal: No abdominal wall hernia or abnormality. No suspicious lytic or blastic osseous lesions. No acute displaced fracture. Review of the MIP images confirms the above findings. IMPRESSION: 1. No pulmonary embolus. 2. No acute intrathoracic abnormality. 3. No acute intra-abdominal or intrapelvic abnormality. 4. Bilateral, left greater than right, renal cortical scarring with possible underlying partial nephrectomy. No definite cortical mass. 5. Aortic Atherosclerosis (ICD10-I70.0). Electronically Signed   By: Tish Frederickson M.D.   On: 03/19/2022 22:10   DG Chest 1 View  Result Date: 03/19/2022 CLINICAL DATA:  Chest pain EXAM: CHEST  1 VIEW COMPARISON:  Chest x-ray 11/21/2021 FINDINGS: Left-sided pacemaker is unchanged in position. The heart size and mediastinal contours are within normal limits. Both lungs are clear. The visualized skeletal structures are unremarkable. IMPRESSION: No active disease. Electronically Signed   By: Darliss Cheney M.D.   On: 03/19/2022 16:54    Procedures Procedures    Medications Ordered in ED Medications  iohexol (OMNIPAQUE) 350 MG/ML injection 80 mL (80 mLs Intravenous Contrast Given 03/19/22 2144)   ED Course/ Medical Decision Making/ A&P   36 year old history of nonischemic cardiomyopathy s/p ICD, HTN, CVA, mural thrombus, DM here for evaluation of loose stool.  Has had loose stool last week and a half.  Generalized abdominal cramping which resolves after bowel movement.  No melena bright blood per rectum.  Does not radiate.  Also noted left-sided chest pressure.  Has some  at baseline however possibly worse today.  No shortness of breath, lower extremity swelling.  Not appear grossly fluid overloaded.  He is on chronic anticoagulation.  Labs and imaging personally viewed and interpreted:  CBC without leukocytosis Metabolic panel, glucose 157 Hepatic function panel without significant abnormality BNP 27 Lipase 68 Troponin 37>>30 Chest x-ray without pneumonia, pulm edema, pneumothorax, infiltrates CTA negative for PE, mass, infection CT abdomen pelvis significant abnormality EKG without significant abnormality  Patient reassessed.  No current chest pain.  Abdomen soft.  Nontender.  Has not been able to read stool sample.  Will discuss with cardiology given his slightly elevated troponin  CONSULT Dr. Elayne Guerin with Cards, given patient without any current pain, he has had some elevated troponins in the past, he has reviewed  his workup so far.  Feel he is safe to discharge.  Patient reassessed.  Continues to be symptomatic.  Has not been able to provide stool sample.  Per nursing ICD without firings.  Patient given given empty cups for stool sample.  Encouraged to follow-up with PCP if his diarrhea does not improved.  In meantime discussed symptomatic management.  With regard to his earlier chest pressure which she currently does not have unclear etiology.  At this time I have low suspicion for acute ACS, PE, dissection.  Will have him follow-up outpatient.  Has appointment tomorrow with [facility] cardiology for possible transplant evaluation.  Will return for new or worsening symptoms.   The patient has been appropriately medically screened and/or stabilized in the ED. I have low suspicion for any other emergent medical condition which would require further screening, evaluation or treatment in the ED or require inpatient management.  Patient is hemodynamically stable and in no acute distress.  Patient able to ambulate in department prior to ED.  Evaluation does not  show acute pathology that would require ongoing or additional emergent interventions while in the emergency department or further inpatient treatment.  I have discussed the diagnosis with the patient and answered all questions.  Pain is been managed while in the emergency department and patient has no further complaints prior to discharge.  Patient is comfortable with plan discussed in room and is stable for discharge at this time.  I have discussed strict return precautions for returning to the emergency department.  Patient was encouraged to follow-up with PCP/specialist refer to at discharge.                             Medical Decision Making Amount and/or Complexity of Data Reviewed Independent Historian: spouse External Data Reviewed: labs, radiology, ECG and notes. Labs: ordered. Decision-making details documented in ED Course. Radiology: ordered and independent interpretation performed. Decision-making details documented in ED Course. ECG/medicine tests: ordered and independent interpretation performed. Decision-making details documented in ED Course.  Risk OTC drugs. Prescription drug management. Parenteral controlled substances. Drug therapy requiring intensive monitoring for toxicity. Decision regarding hospitalization.          Final Clinical Impression(s) / ED Diagnoses Final diagnoses:  Precordial pain  Diarrhea, unspecified type    Rx / DC Orders ED Discharge Orders          Ordered    loperamide (IMODIUM) 2 MG capsule  4 times daily PRN        03/19/22 2302    dicyclomine (BENTYL) 20 MG tablet  2 times daily        03/19/22 2302              Lexandra Rettke A, PA-C 03/19/22 2322    Rondel Baton, MD 03/31/22 [id number]

## 2022-03-19 NOTE — ED Notes (Signed)
Patient transported to CT 

## 2022-03-19 NOTE — Progress Notes (Signed)
Patient ID: Jason Stevenson, male   DOB: Jul 19, 1985, 36 y.o.   MRN: [id number] Virtual Visit via Video Note  I connected with Jason Stevenson on 03/19/2022 at 8:21 AM by a video enabled telemedicine application and verified that I am speaking with the correct person using two identifiers.  Location: Patient: home Provider: Office   I discussed the limitations of evaluation and management by telemedicine and the availability of in person appointments. The patient expressed understanding and agreed to proceed.  History of Present Illness: Patient with history of DM with painful neuropathy, HTN, HL, NICM with systolic CHF EF <99%, ICD ( Cardiologist Dr. 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 Patient complains of problems getting and maintaining an erection for the past 1-1/2 years.  He attributes it to his numerous medical issues and his medications.  Reports that he still has sexual desire but it has decreased some due to problems that he is having getting and maintaining an erection.  He is with the same partner, spouse.  He is not on any nitrates.   Outpatient Encounter Medications as of 03/19/2022  Medication Sig   Accu-Chek Softclix Lancets lancets Use to check blood sugar three times daily. (Patient not taking: Reported on 01/31/2022)   acetaminophen (TYLENOL) 325 MG tablet Take 1-2 tablets (325-650 mg total) by mouth every 4 (four) hours as needed for mild pain.   atorvastatin (LIPITOR) 80 MG tablet TAKE 1 TABLET (80 MG TOTAL) BY MOUTH DAILY (EVENING)   Blood Glucose Monitoring Suppl (ACCU-CHEK GUIDE) w/Device KIT Use to check blood sugar three times daily. (Patient not taking: Reported on 01/31/2022)   Blood Pressure Monitor DEVI Use as directed to check home blood pressure 2-3 times a week    Continuous Blood Gluc Receiver (FREESTYLE LIBRE 2 READER) DEVI 1 each by Does not apply route daily.   Continuous Blood Gluc Sensor (FREESTYLE LIBRE 2 SENSOR) MISC USE EVERY 14 (FOURTEEN) DAYS.   CORLANOR 5 MG TABS tablet TAKE 1 TABLET (5 MG TOTAL) BY MOUTH 2 (TWO) TIMES DAILY WITH A MEAL (AM+EVENING)   dapagliflozin propanediol (FARXIGA) 5 MG TABS tablet Take 1 tablet (5 mg total) by mouth daily before breakfast.   digoxin (LANOXIN) 0.125 MG tablet Take 1 tablet (0.125 mg total) by mouth daily. Please request Cardiologist for future refills.   DULoxetine (CYMBALTA) 30 MG capsule Take 1 capsule (30 mg total) by mouth daily.   ENTRESTO 49-51 MG TAKE 1 TABLET BY MOUTH 2 (TWO) TIMES DAILY (AM+EVENING)   Evolocumab (REPATHA SURECLICK) 371 MG/ML SOAJ Inject 140 mg into the skin every 14 (fourteen) days.   ezetimibe (ZETIA) 10 MG tablet Take 1 tablet (10 mg total) by mouth daily.   furosemide (LASIX) 20 MG tablet Take 1 tablet (20 mg total) by mouth 3 (three) times a week. Mondays Wednesdays and Friday   glucose blood (ACCU-CHEK GUIDE) test strip Use to check blood sugar three times daily.   insulin aspart (FIASP FLEXTOUCH) 100 UNIT/ML FlexTouch Pen Inject 5-8 Units into the skin 3 (three) times daily before meals.   Insulin Pen Needle 32G X 4 MM MISC Use to inject insulin.   Insulin Syringe-Needle U-100 (RELION INSULIN SYRINGE) 31G X 15/64" 0.3 ML MISC Use to inject insulin daily.   levETIRAcetam (KEPPRA) 750 MG tablet Take 2 tablets (1,500 mg total) by mouth 2 (  two) times daily.   Menthol, Topical Analgesic, (ICY HOT BACK EX) Apply 1 application topically as needed (neuropathy). Icy hot cream   metFORMIN (GLUCOPHAGE-XR) 500 MG 24 hr tablet Take 1 tablet (500 mg total) by mouth 2 (two) times daily with a meal.   metoprolol (TOPROL-XL) 200 MG 24 hr tablet TAKE 0.5 TABLETS (100 MG TOTAL) BY MOUTH DAILY. TAKE WITH OR IMMEDIATELY FOLLOWING A MEAL (EVENING)   Oxcarbazepine (TRILEPTAL) 300 MG tablet Take 2  tablets (600 mg total) by mouth 2 (two) times daily.   phenytoin (DILANTIN) 100 MG ER capsule Take 1 capsule (100 mg total) by mouth 3 (three) times daily.   potassium chloride SA (KLOR-CON M) 20 MEQ tablet Take 1 tablet (20 mEq total) by mouth 3 (three) times a week. Take every Mon, Wed and Fri with Lasix   pregabalin (LYRICA) 300 MG capsule Take 1 capsule (300 mg total) by mouth at bedtime.   spironolactone (ALDACTONE) 25 MG tablet Take 1 tablet (25 mg total) by mouth at bedtime.   TRESIBA FLEXTOUCH 200 UNIT/ML FlexTouch Pen INJECT 20 UNITS INTO THE SKIN DAILY.   warfarin (COUMADIN) 5 MG tablet Take 1-1.5 tablets (5-7.5 mg total) by mouth daily.   No facility-administered encounter medications on file as of 03/19/2022.      Observations/Objective: Patient sitting in chair in NAD  Assessment and Plan: 1. Erectile dysfunction associated with type 2 diabetes mellitus ([place]) Discussed diagnosis of erectile dysfunction.  He has diagnoses that can contribute including medications.  He is willing to try Viagra. Pt advised of possible side effects of this medication including prolong erection, flushing, headaches, stuffy nose and sudden vision and hearing changes.  Pt told to be seen in ER if he has erection lasting longer than 2-3hrs, or if he has sudden vision changes or hearing loss. Went over with him how to take the medication. - sildenafil (VIAGRA) 50 MG tablet; 1 tab p.o. half hour prior to intercourse PRN.  Limit use to 1 tab/24 hr  Dispense: 10 tablet; Refill: 6   Follow Up Instructions: As previously scheduled   I discussed the assessment and treatment plan with the patient. The patient was provided an opportunity to ask questions and all were answered. The patient agreed with the plan and demonstrated an understanding of the instructions.   The patient was advised to call back or seek an in-person evaluation if the symptoms worsen or if the condition fails to improve as  anticipated.  I spent 10 minutes dedicated to the care of this patient on the date of this encounter to include previsit review of of his record, face-to-face time with the patient discussing diagnosis and management and postvisit placement of orders.  This note has been created with Surveyor, quantity. Any transcriptional errors are unintentional.  Karle Plumber, MD

## 2022-03-20 ENCOUNTER — Encounter: Payer: Self-pay | Admitting: Internal Medicine

## 2022-03-20 DIAGNOSIS — R5383 Other fatigue: Secondary | ICD-10-CM | POA: Diagnosis not present

## 2022-03-20 DIAGNOSIS — R197 Diarrhea, unspecified: Secondary | ICD-10-CM

## 2022-03-20 DIAGNOSIS — I428 Other cardiomyopathies: Secondary | ICD-10-CM | POA: Diagnosis not present

## 2022-03-20 DIAGNOSIS — I5022 Chronic systolic (congestive) heart failure: Secondary | ICD-10-CM | POA: Diagnosis not present

## 2022-03-20 DIAGNOSIS — E1142 Type 2 diabetes mellitus with diabetic polyneuropathy: Secondary | ICD-10-CM | POA: Diagnosis not present

## 2022-03-20 DIAGNOSIS — Z79899 Other long term (current) drug therapy: Secondary | ICD-10-CM | POA: Diagnosis not present

## 2022-03-20 NOTE — Progress Notes (Signed)
Remote ICD transmission.   

## 2022-03-21 ENCOUNTER — Encounter: Payer: Self-pay | Admitting: Internal Medicine

## 2022-03-21 ENCOUNTER — Telehealth (HOSPITAL_COMMUNITY): Payer: Self-pay | Admitting: Vascular Surgery

## 2022-03-21 ENCOUNTER — Ambulatory Visit (INDEPENDENT_AMBULATORY_CARE_PROVIDER_SITE_OTHER): Payer: Medicaid Other | Admitting: Internal Medicine

## 2022-03-21 ENCOUNTER — Other Ambulatory Visit: Payer: Self-pay

## 2022-03-21 VITALS — BP 106/70 | HR 70 | Ht 71.0 in | Wt 160.2 lb

## 2022-03-21 DIAGNOSIS — E785 Hyperlipidemia, unspecified: Secondary | ICD-10-CM

## 2022-03-21 DIAGNOSIS — E1369 Other specified diabetes mellitus with other specified complication: Secondary | ICD-10-CM | POA: Diagnosis not present

## 2022-03-21 DIAGNOSIS — R197 Diarrhea, unspecified: Secondary | ICD-10-CM

## 2022-03-21 DIAGNOSIS — E139 Other specified diabetes mellitus without complications: Secondary | ICD-10-CM

## 2022-03-21 DIAGNOSIS — E1169 Type 2 diabetes mellitus with other specified complication: Secondary | ICD-10-CM

## 2022-03-21 LAB — POCT GLYCOSYLATED HEMOGLOBIN (HGB A1C): Hemoglobin A1C: 7.6 % — AB (ref 4.0–5.6)

## 2022-03-21 NOTE — Addendum Note (Signed)
Addended by: Eliseo Squires on: 03/21/2022 10:41 AM   Modules accepted: Orders

## 2022-03-21 NOTE — Telephone Encounter (Signed)
Left pt message giving f/u apt w/ [facility] w/ echo/ asked pt to call back to confirm appt

## 2022-03-21 NOTE — Patient Instructions (Addendum)
Please continue: - Metformin ER 500 mg 2x a day, with meals - Farxiga 5 mg before b'fast  - Tresiba 20 units daily  Please increase:  - FiAsp 6-10 units before each meal If you have a snack with >15g of carbs, you may need to take 3-4 units of Fiasp.  Please return in 4 months.

## 2022-03-21 NOTE — Progress Notes (Addendum)
Patient ID: Jason Stevenson, male   DOB: 1985-07-24, 36 y.o.   MRN: [id number]  HPI: Jason Stevenson is a 36 y.o.-year-old male, initially referred by his cardiologist, Dr. Shirlee Latch, returning for follow-up for DM, dx in 2011, but as LADA in 2022, insulin-dependent, uncontrolled, with complications (ischemic cardiomyopathy complicated by systolic CHF, history of CVA -after Covid19 infection, peripheral neuropathy, ED).  Last visit 4 months ago.  Interim history: No increased urination, nausea, but had chest pain last night.  He did contact cardiology about this. He was evaluated at [facility] for heart transplant.  At last visit, he was in their cardiac physical therapy program (jut started), to gain more strength before the transplant.  However, she tells me that he was advised that he may not need a heart transplant. He was in the emergency room with nausea/vomiting/diarrhea on 11/27 and 12/05.  Lipase was elevated on 03/19/2022, at 58.  Reviewed HbA1c levels: Lab Results  Component Value Date   HGBA1C 8.7 (A) 11/15/2021   HGBA1C 7.7 (H) 04/19/2021   HGBA1C 8.2 (H) 01/29/2021   HGBA1C 8.3 (H) 12/14/2020   HGBA1C >15.5 (H) 09/12/2020   HGBA1C >15.5 (H) 11/26/2019   HGBA1C 14.2 (A) 04/26/2019   HGBA1C 13.8 (A) 01/18/2019   HGBA1C 13.7 (A) 10/12/2018   HGBA1C 12.4 (H) 07/02/2018  06/18/2021: HbA1c 7.8%  Previously on: - Metformin 500 mg 2x a day, with meals - Farxiga 5 mg before b'fast - Novolin 70/30 30 min before a meal 22-25 units before breakfast 14-16 units before dinner   We changed to: - Metformin 500 mg 2x a day, with meals >> diarrhea >> metformin ER 500 mg 2x a day - Farxiga 5 mg before b'fast  - Tresiba 20 units daily  - FiAsp 5-8 >> 6-10 >> but using 5-8 units before each meal and 3 to 4 units before her evening snack  Pt checks his sugars >4x a day with his Freestyle Libre CGM:   Previously:   Previously:   Lowest sugar was 51 >> 57 >> 60 >> 67 >> 54; he has  hypoglycemia awareness at 70.  Highest sugar was 321 >> 281 >> 200s >> 400 >> 300s  Glucometer:TrueMetrix  Pt's meals are: - Breakfast: fruit (including banana), PB, toast, pancakes - Lunch: pasta, stir-fry with protein, sandwiches - Dinner: varies - Snacks: fruit, sometimes chips  - no CKD, last BUN/creatinine:  Lab Results  Component Value Date   BUN 13 03/19/2022   BUN 11 01/18/2022   CREATININE 0.87 03/19/2022   CREATININE 0.80 01/18/2022  On Entresto 24/26.  -+ HL; last set of lipids: Lab Results  Component Value Date   CHOL 133 09/21/2021   HDL 47 09/21/2021   LDLCALC 56 09/21/2021   TRIG 149 09/21/2021   CHOLHDL 2.8 09/21/2021  On atorvastatin 80 mg daily, ezetimibe 10 mg daily. Also, Repatha.  - last eye exam was 01/01/2022. No DR. Now that he got insurance, he is planning to schedule this.  - + numbness and tingling in his feet. He was currently referred to a pain clinic and also started on Lyrica, Cymbalta.  Last foot exam 01/31/2022. He had ABIs on 06/22/2021: Normal.  Pt has FH of DM in mother and sister.  He also has HTN.  ROS: + see HPI  Past Medical History:  Diagnosis Date   Chronic systolic CHF (congestive heart failure) (HCC) 06/18/2015   Diabetes mellitus without complication (HCC)    Hemorrhagic stroke (HCC)    Hyperlipidemia  Hypertension    Nonischemic cardiomyopathy (HCC) Noted as early as 2007   Per chart review (cards consult note 2011), EF of 40% in 2007, down to 20-25% in 2011   Past Surgical History:  Procedure Laterality Date   ICD IMPLANT N/A 08/27/2021   Procedure: ICD IMPLANT;  Surgeon: Lanier Prude, MD;  Location: [facility] INVASIVE CV LAB;  Service: Cardiovascular;  Laterality: N/A;   None     RIGHT HEART CATH  06/21/2021   [facility]   RIGHT/LEFT HEART CATH AND CORONARY ANGIOGRAPHY N/A 11/02/2020   Procedure: RIGHT/LEFT HEART CATH AND CORONARY ANGIOGRAPHY;  Surgeon: Laurey Morale, MD;  Location: [facility] INVASIVE CV LAB;   Service: Cardiovascular;  Laterality: N/A;   Social History   Socioeconomic History   Marital status: Married    Spouse name: [name]   Number of children: 0   Years of education: 13   Highest education level: Some college, no degree  Occupational History   Occupation: unemployed  Tobacco Use   Smoking status: Never   Smokeless tobacco: Never  Vaping Use   Vaping Use: Not on file  Substance and Sexual Activity   Alcohol use: Not Currently    Comment: "occasional" when "hanging out with the wrong people" No recent use.   Drug use: Not Currently    Types: Marijuana    Comment: occasional, last 2013   Sexual activity: Not Currently  Other Topics Concern   Not on file  Social History Narrative   Pt lives at home with his wife    Right handed    Caffeine- hardly any   Social Determinants of Health   Financial Resource Strain: High Risk (10/18/2021)   Overall Financial Resource Strain (CARDIA)    Difficulty of Paying Living Expenses: Hard  Food Insecurity: No Food Insecurity (09/14/2020)   Hunger Vital Sign    Worried About Running Out of Food in the Last Year: Never true    Ran Out of Food in the Last Year: Never true  Recent Concern: Food Insecurity - Food Insecurity Present (09/13/2020)   Hunger Vital Sign    Worried About Running Out of Food in the Last Year: Never true    Ran Out of Food in the Last Year: Sometimes true  Transportation Needs: Unmet Transportation Needs (12/15/2020)   PRAPARE - Administrator, Civil Service (Medical): No    Lack of Transportation (Non-Medical): Yes  Physical Activity: Not on file  Stress: Not on file  Social Connections: Not on file  Intimate Partner Violence: Not on file   Current Outpatient Medications on File Prior to Visit  Medication Sig Dispense Refill   acetaminophen (TYLENOL) 325 MG tablet Take 1-2 tablets (325-650 mg total) by mouth every 4 (four) hours as needed for mild pain.     atorvastatin (LIPITOR) 80 MG tablet  TAKE 1 TABLET (80 MG TOTAL) BY MOUTH DAILY (EVENING) 90 tablet 0   Blood Pressure Monitor DEVI Use as directed to check home blood pressure 2-3 times a week 1 Device 0   Continuous Blood Gluc Receiver (FREESTYLE LIBRE 2 READER) DEVI 1 each by Does not apply route daily. 1 each 0   Continuous Blood Gluc Sensor (FREESTYLE LIBRE 2 SENSOR) MISC USE EVERY 14 (FOURTEEN) DAYS. 2 each 6   CORLANOR 5 MG TABS tablet TAKE 1 TABLET (5 MG TOTAL) BY MOUTH 2 (TWO) TIMES DAILY WITH A MEAL (AM+EVENING) 60 tablet 6   dapagliflozin propanediol (FARXIGA) 5 MG TABS tablet  Take 1 tablet (5 mg total) by mouth daily before breakfast. 30 tablet 6   dicyclomine (BENTYL) 20 MG tablet Take 1 tablet (20 mg total) by mouth 2 (two) times daily. 20 tablet 0   digoxin (LANOXIN) 0.125 MG tablet Take 1 tablet (0.125 mg total) by mouth daily. Please request Cardiologist for future refills. 30 tablet 6   DULoxetine (CYMBALTA) 30 MG capsule Take 1 capsule (30 mg total) by mouth daily. 30 capsule 5   ENTRESTO 49-51 MG TAKE 1 TABLET BY MOUTH 2 (TWO) TIMES DAILY (AM+EVENING) 60 tablet 3   Evolocumab (REPATHA SURECLICK) 140 MG/ML SOAJ Inject 140 mg into the skin every 14 (fourteen) days. 2 mL 11   ezetimibe (ZETIA) 10 MG tablet Take 1 tablet (10 mg total) by mouth daily. 30 tablet 3   furosemide (LASIX) 20 MG tablet Take 1 tablet (20 mg total) by mouth 3 (three) times a week. Mondays Wednesdays and Friday 30 tablet 11   glucose blood (ACCU-CHEK GUIDE) test strip Use to check blood sugar three times daily. 100 each 2   insulin aspart (FIASP FLEXTOUCH) 100 UNIT/ML FlexTouch Pen Inject 5-8 Units into the skin 3 (three) times daily before meals. 30 mL 3   Insulin Pen Needle 32G X 4 MM MISC Use to inject insulin. 200 each 2   Insulin Syringe-Needle U-100 (RELION INSULIN SYRINGE) 31G X 15/64" 0.3 ML MISC Use to inject insulin daily. 100 each 11   levETIRAcetam (KEPPRA) 750 MG tablet Take 2 tablets (1,500 mg total) by mouth 2 (two) times daily.  360 tablet 4   loperamide (IMODIUM) 2 MG capsule Take 1 capsule (2 mg total) by mouth 4 (four) times daily as needed for diarrhea or loose stools. 12 capsule 0   Menthol, Topical Analgesic, (ICY HOT BACK EX) Apply 1 application topically as needed (neuropathy). Icy hot cream     metFORMIN (GLUCOPHAGE-XR) 500 MG 24 hr tablet Take 1 tablet (500 mg total) by mouth 2 (two) times daily with a meal. 180 tablet 3   metoprolol (TOPROL-XL) 200 MG 24 hr tablet TAKE 0.5 TABLETS (100 MG TOTAL) BY MOUTH DAILY. TAKE WITH OR IMMEDIATELY FOLLOWING A MEAL (EVENING) 15 tablet 6   Oxcarbazepine (TRILEPTAL) 300 MG tablet Take 2 tablets (600 mg total) by mouth 2 (two) times daily. 360 tablet 4   phenytoin (DILANTIN) 100 MG ER capsule Take 1 capsule (100 mg total) by mouth 3 (three) times daily. 270 capsule 4   potassium chloride SA (KLOR-CON M) 20 MEQ tablet Take 1 tablet (20 mEq total) by mouth 3 (three) times a week. Take every Mon, Wed and Fri with Lasix 30 tablet 4   pregabalin (LYRICA) 300 MG capsule Take 1 capsule (300 mg total) by mouth at bedtime. 30 capsule 5   sildenafil (VIAGRA) 50 MG tablet 1 tab p.o. half hour prior to intercourse PRN.  Limit use to 1 tab/24 hr 10 tablet 6   spironolactone (ALDACTONE) 25 MG tablet Take 1 tablet (25 mg total) by mouth at bedtime. 90 tablet 3   TRESIBA FLEXTOUCH 200 UNIT/ML FlexTouch Pen INJECT 20 UNITS INTO THE SKIN DAILY. 18 mL 3   warfarin (COUMADIN) 5 MG tablet Take 1-1.5 tablets (5-7.5 mg total) by mouth daily. 40 tablet 3   No current facility-administered medications on file prior to visit.   No Known Allergies Family History  Problem Relation Age of Onset   Stroke Mother    Diabetes Mother    Hypertension Mother  Stroke Maternal Aunt    Heart attack Neg Hx    PE: BP 106/70   Pulse 70   Ht  (1.803 m)   Wt 160 lb 3.2 oz (72.7 kg)   SpO2 99%   BMI 22.34 kg/m  Wt Readings from Last 3 Encounters:  03/21/22 160 lb 3.2 oz (72.7 kg)  03/04/22 164 lb  3.2 oz (74.5 kg)  02/25/22 164 lb (74.4 kg)   Constitutional: normal weight, in NAD Eyes: no exophthalmos ENT: no masses palpated in neck, no cervical lymphadenopathy Cardiovascular: RRR, No MRG Respiratory: CTA B Musculoskeletal: no deformities Skin:  no rashes Neurological: no tremor with outstretched hands  ASSESSMENT: 1. LADA, insulin-dependent, uncontrolled, with complications - iCMP, systolic CHF, with LV thrombus - on Coumadin - h/o CVA 08/2020 - PN - ED - on Viagra  Component     Latest Ref Rng & Units 02/05/2021  Glutamic Acid Decarb Ab     0.0 - 5.0 U/mL 20.6 (H)  Islet Cell Ab     Neg:<1:1 Negative   Component     Latest Ref Rng & Units 04/19/2021  C-Peptide     0.80 - 3.85 ng/mL 0.49 (L)  Glucose     65 - 99 mg/dL 78   His C-peptide is low, confirming insulin deficiency >> he has a diagnosis of LADA, behaving more like type 1 diabetes.  2. HL  PLAN:  1. Patient with longstanding, uncontrolled, insulin-dependent diabetes, on basal-bolus insulin regimen and also metformin (ER as he had AP and diarrhea with the IR formulation) and SGLT2 inhibitor.  HbA1c improved after switching from premixed insulin to basal/bolus insulin regimen but worsened afterwards despite starting a cardiac rehab program at [facility] before last visit.  At last visit, HbA1c was 8.7%, higher than before, after coming off the CGM due to insurance coverage.  At that time, sugars are fluctuating within the target range and increasing after every meal, especially lunch and dinner.  Upon questioning, he had late night snacks and sugars were usually higher after this and persisted being elevated throughout the night.  I advised him to increase his Fiasp before larger meals but also to try to stop the snack at night and if he could not, to cooperate with 3 to 4 units of Fiasp if it contains more than 15 g of carbs. CGM interpretation: -At today's visit, we reviewed his CGM downloads: It appears that 62% of  values are in target range (goal >70%), while 38% are higher than 180 (goal <25%), and 0% are lower than 70 (goal <4%).  The calculated average blood sugar is 168.  The projected HbA1c for the next 3 months (GMI) is 7.3%.  Compared with his CGM download from 2 months ago, the sugars appear to be higher, especially after meals.  Upon questioning, however, he has reduced his Fiasp doses and he is missing doses especially when he eats breakfast but occasionally before other meals, also.  We discussed about the need to take Fiasp before every meals, and based on the hyperglycemic spikes, he may need to take a slightly higher dose at times, depending on the composition of his meals.  He agrees to do so. Otherwise, his sugars improved significantly overnight since last visit, and upon questioning, he is trying to eliminate his evening snack or to eat peanuts.  - We can continue the rest of the regimen for now. - I suggested to:  Patient Instructions  Please continue: - Metformin ER 500 mg  2x a day, with meals - Farxiga 5 mg before b'fast  - Tresiba 20 units daily  Please increase:  - FiAsp 6-10 units before each meal If you have a snack with >15g of carbs, you may need to take 3-4 units of Fiasp.  Please return in 4 months.  - we checked his HbA1c: 7.6% (improved) - advised to check sugars at different times of the day - 4x a day, rotating check times - advised for yearly eye exams >> he is UTD - return to clinic in 4 months  2. HL -Reviewed his latest lipid panel-fractions at goal: Lab Results  Component Value Date   CHOL 133 09/21/2021   HDL 47 09/21/2021   LDLCALC 56 09/21/2021   TRIG 149 09/21/2021   CHOLHDL 2.8 09/21/2021  -He continues on atorvastatin 80 mg daily, Zetia 10 mg daily and Repatha-without side effects  Carlus Pavlov, MD PhD [facility]

## 2022-03-22 ENCOUNTER — Ambulatory Visit: Payer: Medicaid Other | Admitting: Internal Medicine

## 2022-03-25 ENCOUNTER — Ambulatory Visit: Payer: Medicaid Other | Attending: Cardiology

## 2022-03-25 DIAGNOSIS — I513 Intracardiac thrombosis, not elsewhere classified: Secondary | ICD-10-CM | POA: Diagnosis not present

## 2022-03-25 DIAGNOSIS — Z5181 Encounter for therapeutic drug level monitoring: Secondary | ICD-10-CM | POA: Diagnosis not present

## 2022-03-25 DIAGNOSIS — Z7901 Long term (current) use of anticoagulants: Secondary | ICD-10-CM | POA: Diagnosis not present

## 2022-03-25 DIAGNOSIS — I639 Cerebral infarction, unspecified: Secondary | ICD-10-CM

## 2022-03-25 LAB — POCT INR: INR: 1.8 — AB (ref 2.0–3.0)

## 2022-03-25 NOTE — Patient Instructions (Signed)
Today take 2 tablets then continue taking Warfarin 1.5 tablets daily except for 1 tablet on Mondays, Wednesday and Fridays. Stay consistent with leafy veggies. Recheck INR in 4 weeks. Coumadin Clinic [phone]

## 2022-03-26 ENCOUNTER — Encounter: Payer: Self-pay | Admitting: Internal Medicine

## 2022-03-26 DIAGNOSIS — E139 Other specified diabetes mellitus without complications: Secondary | ICD-10-CM

## 2022-03-26 LAB — CDIFF NAA+O+P+STOOL CULTURE
E coli, Shiga toxin Assay: NEGATIVE
Toxigenic C. Difficile by PCR: NEGATIVE

## 2022-03-26 NOTE — Progress Notes (Signed)
[facility] PREP Weekly Session  Patient Details  Name: Jason Stevenson MRN: [id number] Date of Birth: [birth year] Age: 36 y.o. PCP: Marcine Matar, MD  Vitals:   03/25/22 1430  Weight: 158 lb (71.7 kg)     [facility] Weekly seesion - 03/25/22 1600       [facility] "PREP" Location   [facility] "PREP" Location [facility]      Weekly Session   Topic Discussed Stress management and problem solving    Minutes exercised this week 15 minutes    Classes attended to date 59             Pam Jerral Bonito 03/26/2022, 11:28 AM

## 2022-03-27 ENCOUNTER — Other Ambulatory Visit: Payer: Self-pay | Admitting: Internal Medicine

## 2022-03-27 ENCOUNTER — Other Ambulatory Visit (HOSPITAL_COMMUNITY): Payer: Self-pay | Admitting: Cardiology

## 2022-03-27 MED ORDER — FREESTYLE LIBRE 3 SENSOR MISC: 1.0000 | 1 refills | Status: DC

## 2022-03-27 MED ORDER — FREESTYLE LIBRE 3 READER DEVI: 1.0000 | Freq: Once | 0 refills | Status: AC

## 2022-03-28 ENCOUNTER — Ambulatory Visit: Payer: Self-pay

## 2022-03-28 NOTE — Telephone Encounter (Signed)
Pts wife is calling to report that pt has loss of appetite, possible temperature, with fatigue, with some diarrhea. available appts. Please advise   Chief Complaint: Fatigue, loss of appetite, diarrhea x 2 weeks. Asking to be worked in. Symptoms: Above Frequency: 2 weeks Pertinent Negatives: Patient denies fever Disposition: [] ED /[] Urgent Care (no appt availability in office) / [] Appointment(In office/virtual)/ []  [place] [place] [facility]/ [] Home Care/ [] Refused Recommended Disposition /[] [place] [facility]/ [x]  Follow-up with PCP Additional Notes: Please advise wife.  Answer Assessment - Initial Assessment Questions 1. DESCRIPTION: "Describe how you are feeling."     Fatigue 2. SEVERITY: "How bad is it?"  "Can you stand and walk?"   - MILD (0-3): Feels weak or tired, but does not interfere with work, school or normal activities.   - MODERATE (4-7): Able to stand and walk; weakness interferes with work, school, or normal activities.   - SEVERE (8-10): Unable to stand or walk; unable to do usual activities.     Moderate 3. ONSET: "When did these symptoms begin?" (e.g., hours, days, weeks, months)     2 weeks ago 4. CAUSE: "What do you think is causing the weakness or fatigue?" (e.g., not drinking enough fluids, medical problem, trouble sleeping)     Not drinking well 5. NEW MEDICINES:  "Have you started on any new medicines recently?" (e.g., opioid pain medicines, benzodiazepines, muscle relaxants, antidepressants, antihistamines, neuroleptics, beta blockers)     No 6. OTHER SYMPTOMS: "Do you have any other symptoms?" (e.g., chest pain, fever, cough, SOB, vomiting, diarrhea, bleeding, other areas of pain)     Diarrhea, decreased appetite 7. PREGNANCY: "Is there any chance you are pregnant?" "When was your last menstrual period?"     N/a  Protocols used: Weakness (Generalized) and Fatigue-A-AH

## 2022-03-28 NOTE — Telephone Encounter (Signed)
Patient phoned with no answer. Message left to return call.

## 2022-03-29 NOTE — Telephone Encounter (Signed)
Evaluated in ED today.

## 2022-04-02 ENCOUNTER — Other Ambulatory Visit: Payer: Self-pay

## 2022-04-03 NOTE — Progress Notes (Signed)
[facility] PREP Weekly Session  Patient Details  Name: Jacolby Risby MRN: [id number] Date of Birth: 1986-03-18 Age: 36 y.o. PCP: Marcine Matar, MD  Vitals:   04/01/22 1430  Weight: 156 lb 9.6 oz (71 kg)     [facility] Weekly seesion - 04/03/22 1000       [facility] "PREP" Location   [facility] "PREP" Location [facility]      Weekly Session   Topic Discussed Expectations and non-scale victories    Minutes exercised this week --   not reported   Classes attended to date 11             Bonnye Fava 04/03/2022, 10:43 AM

## 2022-04-11 ENCOUNTER — Emergency Department (HOSPITAL_BASED_OUTPATIENT_CLINIC_OR_DEPARTMENT_OTHER): Payer: Medicaid Other

## 2022-04-11 ENCOUNTER — Other Ambulatory Visit: Payer: Self-pay

## 2022-04-11 ENCOUNTER — Emergency Department (HOSPITAL_BASED_OUTPATIENT_CLINIC_OR_DEPARTMENT_OTHER): Payer: Medicaid Other | Admitting: Radiology

## 2022-04-11 ENCOUNTER — Emergency Department (HOSPITAL_BASED_OUTPATIENT_CLINIC_OR_DEPARTMENT_OTHER)
Admission: EM | Admit: 2022-04-11 | Discharge: 2022-04-11 | Disposition: A | Discharge status: Home / Self Care | Payer: Medicaid Other | Attending: Emergency Medicine | Admitting: Emergency Medicine

## 2022-04-11 ENCOUNTER — Encounter (HOSPITAL_BASED_OUTPATIENT_CLINIC_OR_DEPARTMENT_OTHER): Payer: Self-pay | Admitting: Emergency Medicine

## 2022-04-11 DIAGNOSIS — Z955 Presence of coronary angioplasty implant and graft: Secondary | ICD-10-CM | POA: Insufficient documentation

## 2022-04-11 DIAGNOSIS — R42 Dizziness and giddiness: Secondary | ICD-10-CM | POA: Diagnosis not present

## 2022-04-11 DIAGNOSIS — Z79899 Other long term (current) drug therapy: Secondary | ICD-10-CM | POA: Insufficient documentation

## 2022-04-11 DIAGNOSIS — R0789 Other chest pain: Secondary | ICD-10-CM | POA: Diagnosis not present

## 2022-04-11 DIAGNOSIS — J209 Acute bronchitis, unspecified: Secondary | ICD-10-CM | POA: Insufficient documentation

## 2022-04-11 DIAGNOSIS — Z794 Long term (current) use of insulin: Secondary | ICD-10-CM | POA: Diagnosis not present

## 2022-04-11 DIAGNOSIS — I5022 Chronic systolic (congestive) heart failure: Secondary | ICD-10-CM | POA: Diagnosis not present

## 2022-04-11 DIAGNOSIS — J9801 Acute bronchospasm: Secondary | ICD-10-CM | POA: Diagnosis not present

## 2022-04-11 DIAGNOSIS — R0602 Shortness of breath: Secondary | ICD-10-CM | POA: Diagnosis present

## 2022-04-11 DIAGNOSIS — Z95810 Presence of automatic (implantable) cardiac defibrillator: Secondary | ICD-10-CM | POA: Diagnosis not present

## 2022-04-11 DIAGNOSIS — I110 Hypertensive heart disease with heart failure: Secondary | ICD-10-CM | POA: Diagnosis not present

## 2022-04-11 DIAGNOSIS — Z20822 Contact with and (suspected) exposure to covid-19: Secondary | ICD-10-CM | POA: Insufficient documentation

## 2022-04-11 DIAGNOSIS — R079 Chest pain, unspecified: Secondary | ICD-10-CM | POA: Diagnosis not present

## 2022-04-11 DIAGNOSIS — R059 Cough, unspecified: Secondary | ICD-10-CM | POA: Diagnosis not present

## 2022-04-11 HISTORY — DX: Immunodeficiency, unspecified: D84.9

## 2022-04-11 LAB — BASIC METABOLIC PANEL
Anion gap: 12 (ref 5–15)
BUN: 10 mg/dL (ref 6–20)
CO2: 28 mmol/L (ref 22–32)
Calcium: 9.1 mg/dL (ref 8.9–10.3)
Chloride: 106 mmol/L (ref 98–111)
Creatinine, Ser: 0.94 mg/dL (ref 0.61–1.24)
GFR, Estimated: >60 mL/min (ref >60)
Glucose, Bld: 205 mg/dL — ABNORMAL HIGH (ref 70–99)
Potassium: 3.8 mmol/L (ref 3.5–5.1)
Sodium: 146 mmol/L — ABNORMAL HIGH (ref 135–145)

## 2022-04-11 LAB — CBC
HCT: 40.6 % (ref 39.0–52.0)
Hemoglobin: 13 g/dL (ref 13.0–17.0)
MCH: 28.8 pg (ref 26.0–34.0)
MCHC: 32 g/dL (ref 30.0–36.0)
MCV: 90 fL (ref 80.0–100.0)
Platelets: 213 10*3/uL (ref 150–400)
RBC: 4.51 MIL/uL (ref 4.22–5.81)
RDW: 12.6 % (ref 11.5–15.5)
WBC: 5.5 10*3/uL (ref 4.0–10.5)
nRBC: 0 % (ref 0.0–0.2)

## 2022-04-11 LAB — RESP PANEL BY RT-PCR (RSV, FLU A&B, COVID)  RVPGX2
Influenza A by PCR: NEGATIVE
Influenza B by PCR: NEGATIVE
Resp Syncytial Virus by PCR: NEGATIVE
SARS Coronavirus 2 by RT PCR: NEGATIVE

## 2022-04-11 LAB — PROTIME-INR
INR: 1.1 (ref 0.8–1.2)
Prothrombin Time: 14 seconds (ref 11.4–15.2)

## 2022-04-11 LAB — TROPONIN I (HIGH SENSITIVITY)
Troponin I (High Sensitivity): 11 ng/L (ref <18)
Troponin I (High Sensitivity): 12 ng/L (ref <18)

## 2022-04-11 MED ORDER — AEROCHAMBER PLUS FLO-VU MISC
1.0000 | Freq: Once | Status: AC
  Administered 2022-04-11: 1
  Filled 2022-04-11: qty 1

## 2022-04-11 MED ORDER — ALBUTEROL SULFATE HFA 108 (90 BASE) MCG/ACT IN AERS
2.0000 | INHALATION_SPRAY | RESPIRATORY_TRACT | Status: DC | PRN
  Administered 2022-04-11: 2 via RESPIRATORY_TRACT
  Filled 2022-04-11: qty 6.7

## 2022-04-11 NOTE — ED Notes (Signed)
RT educated pt on proper use of MDI w/spacer. Pt able to perform w/out difficulty. Pt respiratory status stable w/no distress noted at this time.  

## 2022-04-11 NOTE — ED Provider Notes (Signed)
[facility] Provider Note: Georgena Spurling, MD, FACEP  CSN: [id number] MRN: [id number] ARRIVAL: 04/11/22 at [place]: [place]  Chest Pain   HISTORY OF PRESENT ILLNESS  04/11/22 11:02 PM Jason Stevenson is a 36 y.o. male with a history of cardiomyopathy and CHF.  He is here with 2 days of general malaise, cough, sore throat, chest tightness and shortness of breath.  He rates his chest tightness is an 8 out of 10.  The sore throat is worse with coughing.  He has also felt lightheaded at times.   Past Medical History:  Diagnosis Date   Chronic systolic CHF (congestive heart failure) ([place]) 06/18/2015   Hemorrhagic stroke ([place])    Hyperlipidemia    Hypertension    Immune deficiency disorder ([place])    Nonischemic cardiomyopathy ([place]) Noted as early as 2007   Per chart review (cards consult note 2011), EF of 40% in 2007, down to 20-25% in 2011    Past Surgical History:  Procedure Laterality Date   ICD IMPLANT N/A 08/27/2021   Procedure: ICD IMPLANT;  Surgeon: Vickie Epley, MD;  Location: [facility];  Service: Cardiovascular;  Laterality: N/A;   None     RIGHT HEART CATH  06/21/2021   [facility]   RIGHT/LEFT HEART CATH AND CORONARY ANGIOGRAPHY N/A 11/02/2020   Procedure: RIGHT/LEFT HEART CATH AND CORONARY ANGIOGRAPHY;  Surgeon: Larey Dresser, MD;  Location: [facility];  Service: Cardiovascular;  Laterality: N/A;    Family History  Problem Relation Age of Onset   Stroke Mother    Diabetes Mother    Hypertension Mother    Stroke Maternal Aunt    Heart attack Neg Hx     Social History   Tobacco Use   Smoking status: Never   Smokeless tobacco: Never  Substance Use Topics   Alcohol use: Not Currently    Comment: "occasional" when "hanging out with the wrong people" No recent use.   Drug use: Not Currently    Types: Marijuana    Comment: occasional, last 2013    Prior to Admission medications   Medication Sig  Start Date End Date Taking? Authorizing Provider  acetaminophen (TYLENOL) 325 MG tablet Take 1-2 tablets (325-650 mg total) by mouth every 4 (four) hours as needed for mild pain. 08/28/21   Shirley Friar, PA-C  atorvastatin (LIPITOR) 80 MG tablet TAKE 1 TABLET (80 MG TOTAL) BY MOUTH DAILY (EVENING) 01/11/22   Ladell Pier, MD  Blood Pressure Monitor DEVI Use as directed to check home blood pressure 2-3 times a week 10/12/18   Ladell Pier, MD  Continuous Blood Gluc Sensor (FREESTYLE LIBRE 3 SENSOR) MISC 1 applicator by Does not apply route every 14 (fourteen) days. 03/27/22   Philemon Kingdom, MD  CORLANOR 5 MG TABS tablet TAKE 1 TABLET (5 MG TOTAL) BY MOUTH 2 (TWO) TIMES DAILY WITH A MEAL (AM+EVENING) 02/25/22   Milford, Maricela Bo, FNP  dapagliflozin propanediol (FARXIGA) 5 MG TABS tablet Take 1 tablet (5 mg total) by mouth daily before breakfast. 10/03/21   Larey Dresser, MD  dicyclomine (BENTYL) 20 MG tablet Take 1 tablet (20 mg total) by mouth 2 (two) times daily. 03/19/22   Henderly, Britni A, PA-C  digoxin (LANOXIN) 0.125 MG tablet Take 1 tablet (0.125 mg total) by mouth daily. Please request Cardiologist for future refills. 12/14/21   Larey Dresser, MD  DULoxetine (CYMBALTA) 30 MG capsule Take 1 capsule (  30 mg total) by mouth daily. 02/28/22   Ladell Pier, MD  ENTRESTO 49-51 MG TAKE 1 TABLET BY MOUTH 2 (TWO) TIMES DAILY (AM+EVENING) 01/11/22   Larey Dresser, MD  Evolocumab (REPATHA SURECLICK) [id number] MG/ML SOAJ Inject 140 mg into the skin every 14 (fourteen) days. 11/16/21   Larey Dresser, MD  ezetimibe (ZETIA) 10 MG tablet TAKE 1 TABLET (10 MG TOTAL) BY MOUTH DAILY (AM) 03/27/22   Larey Dresser, MD  furosemide (LASIX) 20 MG tablet Take 1 tablet (20 mg total) by mouth 3 (three) times a week. Mondays Wednesdays and Friday 11/02/21   Rafael Bihari, FNP  glucose blood (ACCU-CHEK GUIDE) test strip Use to check blood sugar three times daily. 04/05/21   Ladell Pier, MD  insulin aspart (FIASP FLEXTOUCH) 100 UNIT/ML FlexTouch Pen Inject 5-8 Units into the skin 3 (three) times daily before meals. 04/19/21   Philemon Kingdom, MD  Insulin Pen Needle 32G X 4 MM MISC Use to inject insulin. 04/05/21   Ladell Pier, MD  Insulin Syringe-Needle U-100 (RELION INSULIN SYRINGE) 31G X 15/64" 0.3 ML MISC Use to inject insulin daily. 07/13/18   Ladell Pier, MD  levETIRAcetam (KEPPRA) 750 MG tablet Take 2 tablets (1,500 mg total) by mouth 2 (two) times daily. 06/26/21 09/19/22  Penumalli, Earlean Polka, MD  loperamide (IMODIUM) 2 MG capsule Take 1 capsule (2 mg total) by mouth 4 (four) times daily as needed for diarrhea or loose stools. 03/19/22   Henderly, Britni A, PA-C  Menthol, Topical Analgesic, (ICY HOT BACK EX) Apply 1 application topically as needed (neuropathy). Icy hot cream    [provider]  metFORMIN (GLUCOPHAGE-XR) 500 MG 24 hr tablet Take 1 tablet (500 mg total) by mouth 2 (two) times daily with a meal. 07/17/21   Philemon Kingdom, MD  metoprolol (TOPROL-XL) 200 MG 24 hr tablet TAKE 0.5 TABLETS (100 MG TOTAL) BY MOUTH DAILY. TAKE WITH OR IMMEDIATELY FOLLOWING A MEAL (EVENING) 12/14/21   Larey Dresser, MD  Oxcarbazepine (TRILEPTAL) 300 MG tablet Take 2 tablets (600 mg total) by mouth 2 (two) times daily. 06/26/21 09/19/22  Penumalli, Earlean Polka, MD  phenytoin (DILANTIN) 100 MG ER capsule Take 1 capsule (100 mg total) by mouth 3 (three) times daily. 06/26/21 09/19/22  Penumalli, Earlean Polka, MD  potassium chloride SA (KLOR-CON M) 20 MEQ tablet Take 1 tablet (20 mEq total) by mouth 3 (three) times a week. Take every Mon, Wed and Fri with Lasix 11/02/21   Milford, Maricela Bo, FNP  pregabalin (LYRICA) 300 MG capsule Take 1 capsule (300 mg total) by mouth at bedtime. 02/28/22   Ladell Pier, MD  sildenafil (VIAGRA) 50 MG tablet 1 tab p.o. half hour prior to intercourse PRN.  Limit use to 1 tab/24 hr 03/19/22   Ladell Pier, MD  spironolactone  (ALDACTONE) 25 MG tablet TAKE 1 TABLET (25 MG TOTAL) BY MOUTH AT BEDTIME. 03/27/22   Ladell Pier, MD  TRESIBA FLEXTOUCH 200 UNIT/ML FlexTouch Pen INJECT 20 UNITS INTO THE SKIN DAILY. 01/29/22   Philemon Kingdom, MD  warfarin (COUMADIN) 5 MG tablet Take 1-1.5 tablets (5-7.5 mg total) by mouth daily. 01/14/22   Camnitz, Ocie Doyne, MD    Allergies Patient has no known allergies.   REVIEW OF SYSTEMS  Negative except as noted here or in the History of Present Illness.   PHYSICAL EXAMINATION  Initial Vital Signs Blood pressure 129/85, pulse 60, temperature 98.3 F (36.8 C), resp.  rate (!) 9, SpO2 98 %.  Examination General: Well-developed, well-nourished male in no acute distress; appearance consistent with age of record HENT: normocephalic; atraumatic; mild pharyngeal erythema without exudate Eyes: Normal appearance Neck: supple Heart: regular rate and rhythm Lungs: Decreased air movement bilaterally Abdomen: soft; nondistended; nontender; bowel sounds present Extremities: No deformity; full range of motion Neurologic: Awake, alert and oriented; motor function intact in all extremities and symmetric; no facial droop Skin: Warm and dry Psychiatric: Normal mood and affect   RESULTS  Summary of this visit's results, reviewed and interpreted by myself:   EKG Interpretation  Date/Time:  Thursday April 11 2022 17:22:19 EST Ventricular Rate:  84 PR Interval:  150 QRS Duration: 114 QT Interval:  392 QTC Calculation: 463 R Axis:   121 Text Interpretation: Normal sinus rhythm Left posterior fascicular block T wave abnormality, consider inferolateral ischemia Prolonged QT Abnormal ECG When compared with ECG of 19-Mar-2022 16:25, Premature ventricular complexes are no longer Present QT has lengthened Confirmed by Katrece Roediger ([id number]) on 04/11/2022 11:04:58 PM       Laboratory Studies: Results for orders placed or performed during the hospital encounter of 04/11/22 (from  the past 24 hour(s))  Basic metabolic panel     Status: Abnormal   Collection Time: 04/11/22  5:17 PM  Result Value Ref Range   Sodium 146 (H) 135 - 145 mmol/L   Potassium 3.8 3.5 - 5.1 mmol/L   Chloride 106 98 - 111 mmol/L   CO2 28 22 - 32 mmol/L   Glucose, Bld 205 (H) 70 - 99 mg/dL   BUN 10 6 - 20 mg/dL   Creatinine, Ser 8.50 0.61 - 1.24 mg/dL   Calcium 9.1 8.9 - 27.7 mg/dL   GFR, Estimated >41 >28 mL/min   Anion gap 12 5 - 15  CBC     Status: None   Collection Time: 04/11/22  5:17 PM  Result Value Ref Range   WBC 5.5 4.0 - 10.5 K/uL   RBC 4.51 4.22 - 5.81 MIL/uL   Hemoglobin 13.0 13.0 - 17.0 g/dL   HCT 78.6 76.7 - 20.9 %   MCV 90.0 80.0 - 100.0 fL   MCH 28.8 26.0 - 34.0 pg   MCHC 32.0 30.0 - 36.0 g/dL   RDW 47.0 96.2 - 83.6 %   Platelets 213 150 - 400 K/uL   nRBC 0.0 0.0 - 0.2 %  Troponin I (High Sensitivity)     Status: None   Collection Time: 04/11/22  5:17 PM  Result Value Ref Range   Troponin I (High Sensitivity) 11 <18 ng/L  Resp panel by RT-PCR (RSV, Flu A&B, Covid) Anterior Nasal Swab     Status: None   Collection Time: 04/11/22  5:17 PM   Specimen: Anterior Nasal Swab  Result Value Ref Range   SARS Coronavirus 2 by RT PCR NEGATIVE NEGATIVE   Influenza A by PCR NEGATIVE NEGATIVE   Influenza B by PCR NEGATIVE NEGATIVE   Resp Syncytial Virus by PCR NEGATIVE NEGATIVE  Protime-INR (order if Patient is taking Coumadin / Warfarin)     Status: None   Collection Time: 04/11/22  5:17 PM  Result Value Ref Range   Prothrombin Time 14.0 11.4 - 15.2 seconds   INR 1.1 0.8 - 1.2  Troponin I (High Sensitivity)     Status: None   Collection Time: 04/11/22  8:52 PM  Result Value Ref Range   Troponin I (High Sensitivity) 12 <18 ng/L   Imaging Studies: DG  Chest 2 View  Result Date: 04/11/2022 CLINICAL DATA:  Chest pain, cough EXAM: CHEST - 2 VIEW COMPARISON:  03/19/2022 FINDINGS: Left AICD remains in place, unchanged. Heart and mediastinal contours are within normal limits.  No focal opacities or effusions. No acute bony abnormality. IMPRESSION: Negative. Electronically Signed   By: Rolm Baptise M.D.   On: 04/11/2022 17:45    ED COURSE and MDM  Nursing notes, initial and subsequent vitals signs, including pulse oximetry, reviewed and interpreted by myself.  Vitals:   04/11/22 2052 04/11/22 2200 04/11/22 2300 04/11/22 2321  BP: 122/86 129/85 129/86 (!) 125/91  Pulse: 71 60 64 62  Resp: 16 (!) 9 16 16   Temp:      SpO2: 98% 98% 95% 100%   Medications  albuterol (VENTOLIN HFA) 108 (90 Base) MCG/ACT inhaler 2 puff (2 puffs Inhalation Given by Other 04/11/22 2323)  aerochamber plus with mask device 1 each (1 each Other Given 04/11/22 2323)   11:15 PM Patient's EKG is not significantly changed and his troponins are within normal limits.  His air movement is diminished and he has a cough.  I suspect he has an acute bronchitis although he is negative for tested viruses.  We will trial albuterol and attempt to improve his air movement.  11:37 PM Air movement and chest tightness improved after albuterol treatment.  He was given an albuterol inhaler and AeroChamber and instructed in their use.  I suspect he has an acute bronchitis with bronchospasm.   PROCEDURES  Procedures   ED DIAGNOSES     ICD-10-CM   1. Acute bronchitis with bronchospasm  J20.9          Enes Rokosz, MD 04/11/22 2338

## 2022-04-11 NOTE — ED Triage Notes (Signed)
Chest pain, "cold" itchy throat. Cough. Started tuesday

## 2022-04-12 ENCOUNTER — Other Ambulatory Visit (HOSPITAL_COMMUNITY): Payer: Self-pay

## 2022-04-12 ENCOUNTER — Encounter: Payer: Self-pay | Admitting: Internal Medicine

## 2022-04-12 NOTE — Telephone Encounter (Signed)
Per test claim Medicaid is covering the [facility] 3 Sensor for a 30 day supply without prior authorization. I called Pharmacy and explained even though patient just picked up the Libre 2, it's not working and he needs the 3. Medicaid will pay for the claim. Pharmacist is pushing through and filling now and will he will call the patient when it is ready to be picked up.

## 2022-04-17 ENCOUNTER — Other Ambulatory Visit (HOSPITAL_COMMUNITY): Payer: Self-pay

## 2022-04-17 DIAGNOSIS — I5022 Chronic systolic (congestive) heart failure: Secondary | ICD-10-CM

## 2022-04-22 ENCOUNTER — Ambulatory Visit: Payer: Medicaid Other | Attending: Cardiology

## 2022-04-22 DIAGNOSIS — I639 Cerebral infarction, unspecified: Secondary | ICD-10-CM | POA: Diagnosis not present

## 2022-04-22 DIAGNOSIS — I513 Intracardiac thrombosis, not elsewhere classified: Secondary | ICD-10-CM

## 2022-04-22 DIAGNOSIS — Z7901 Long term (current) use of anticoagulants: Secondary | ICD-10-CM

## 2022-04-22 DIAGNOSIS — Z5181 Encounter for therapeutic drug level monitoring: Secondary | ICD-10-CM | POA: Diagnosis not present

## 2022-04-22 LAB — POCT INR: INR: 1.5 — AB (ref 2.0–3.0)

## 2022-04-22 NOTE — Patient Instructions (Signed)
Today take 2 tablets then continue taking Warfarin 1.5 tablets daily except for 1 tablet on Mondays, Wednesday and Fridays. Stay consistent with leafy veggies. Recheck INR in 2 weeks. [facility] [phone]

## 2022-04-25 ENCOUNTER — Other Ambulatory Visit (HOSPITAL_COMMUNITY): Payer: Self-pay | Admitting: Family Medicine

## 2022-04-25 ENCOUNTER — Other Ambulatory Visit (HOSPITAL_COMMUNITY): Payer: Self-pay | Admitting: Cardiology

## 2022-04-29 ENCOUNTER — Other Ambulatory Visit (HOSPITAL_COMMUNITY): Payer: Self-pay

## 2022-04-29 ENCOUNTER — Telehealth: Payer: Self-pay | Admitting: Pharmacy Technician

## 2022-04-29 NOTE — Telephone Encounter (Addendum)
Pharmacy Patient Advocate Encounter   Received notification from Pt messages that prior authorization for [facility] 3 Reader is required/requested.   PA submitted on 04/29/22 to (ins) [place] Medicaid via fax [phone] (the 3 reader still isn't on the [facility] Tracks electronic form.)  Status is pending

## 2022-04-30 ENCOUNTER — Ambulatory Visit (HOSPITAL_COMMUNITY)
Admission: RE | Admit: 2022-04-30 | Discharge: 2022-04-30 | Disposition: A | Discharge status: Home / Self Care | Payer: Medicaid Other | Source: Ambulatory Visit | Attending: Internal Medicine | Admitting: Internal Medicine

## 2022-04-30 ENCOUNTER — Encounter (HOSPITAL_COMMUNITY): Payer: Self-pay | Admitting: Cardiology

## 2022-04-30 ENCOUNTER — Ambulatory Visit (HOSPITAL_BASED_OUTPATIENT_CLINIC_OR_DEPARTMENT_OTHER)
Admission: RE | Admit: 2022-04-30 | Discharge: 2022-04-30 | Disposition: A | Discharge status: Home / Self Care | Payer: Medicaid Other | Source: Ambulatory Visit | Attending: Cardiology | Admitting: Cardiology

## 2022-04-30 ENCOUNTER — Other Ambulatory Visit (HOSPITAL_COMMUNITY): Payer: Self-pay

## 2022-04-30 VITALS — BP 132/86 | HR 66 | Ht 71.0 in | Wt 162.6 lb

## 2022-04-30 DIAGNOSIS — Z7984 Long term (current) use of oral hypoglycemic drugs: Secondary | ICD-10-CM | POA: Insufficient documentation

## 2022-04-30 DIAGNOSIS — I5022 Chronic systolic (congestive) heart failure: Secondary | ICD-10-CM | POA: Diagnosis not present

## 2022-04-30 DIAGNOSIS — Z7901 Long term (current) use of anticoagulants: Secondary | ICD-10-CM | POA: Insufficient documentation

## 2022-04-30 DIAGNOSIS — I110 Hypertensive heart disease with heart failure: Secondary | ICD-10-CM | POA: Diagnosis not present

## 2022-04-30 DIAGNOSIS — Z8673 Personal history of transient ischemic attack (TIA), and cerebral infarction without residual deficits: Secondary | ICD-10-CM | POA: Insufficient documentation

## 2022-04-30 DIAGNOSIS — Z79899 Other long term (current) drug therapy: Secondary | ICD-10-CM | POA: Insufficient documentation

## 2022-04-30 DIAGNOSIS — R0602 Shortness of breath: Secondary | ICD-10-CM | POA: Insufficient documentation

## 2022-04-30 DIAGNOSIS — E785 Hyperlipidemia, unspecified: Secondary | ICD-10-CM | POA: Insufficient documentation

## 2022-04-30 DIAGNOSIS — Z794 Long term (current) use of insulin: Secondary | ICD-10-CM | POA: Insufficient documentation

## 2022-04-30 DIAGNOSIS — E1042 Type 1 diabetes mellitus with diabetic polyneuropathy: Secondary | ICD-10-CM | POA: Insufficient documentation

## 2022-04-30 DIAGNOSIS — R55 Syncope and collapse: Secondary | ICD-10-CM | POA: Insufficient documentation

## 2022-04-30 DIAGNOSIS — I428 Other cardiomyopathies: Secondary | ICD-10-CM | POA: Insufficient documentation

## 2022-04-30 DIAGNOSIS — G40909 Epilepsy, unspecified, not intractable, without status epilepticus: Secondary | ICD-10-CM | POA: Diagnosis not present

## 2022-04-30 DIAGNOSIS — R5383 Other fatigue: Secondary | ICD-10-CM | POA: Diagnosis not present

## 2022-04-30 DIAGNOSIS — Z95810 Presence of automatic (implantable) cardiac defibrillator: Secondary | ICD-10-CM | POA: Insufficient documentation

## 2022-04-30 LAB — BASIC METABOLIC PANEL
Anion gap: 7 (ref 5–15)
BUN: 14 mg/dL (ref 6–20)
CO2: 28 mmol/L (ref 22–32)
Calcium: 8.7 mg/dL — ABNORMAL LOW (ref 8.9–10.3)
Chloride: 105 mmol/L (ref 98–111)
Creatinine, Ser: 0.78 mg/dL (ref 0.61–1.24)
GFR, Estimated: >60 mL/min (ref >60)
Glucose, Bld: 84 mg/dL (ref 70–99)
Potassium: 4.1 mmol/L (ref 3.5–5.1)
Sodium: 140 mmol/L (ref 135–145)

## 2022-04-30 LAB — ECHOCARDIOGRAM COMPLETE
AR max vel: 2.23 cm2
AV Area VTI: 2.19 cm2
AV Area mean vel: 2.08 cm2
AV Mean grad: 3 mmHg
AV Peak grad: 6.5 mmHg
Ao pk vel: 1.27 m/s
Area-P 1/2: 4.49 cm2
Est EF: <20
S' Lateral: 4.9 cm

## 2022-04-30 LAB — LIPID PANEL
Cholesterol: 175 mg/dL (ref 0–200)
HDL: 61 mg/dL (ref >40)
LDL Cholesterol: 98 mg/dL (ref 0–99)
Total CHOL/HDL Ratio: 2.9 RATIO
Triglycerides: 82 mg/dL (ref <150)
VLDL: 16 mg/dL (ref 0–40)

## 2022-04-30 LAB — BRAIN NATRIURETIC PEPTIDE: B Natriuretic Peptide: 56.2 pg/mL (ref 0.0–100.0)

## 2022-04-30 LAB — DIGOXIN LEVEL: Digoxin Level: 0.4 ng/mL — ABNORMAL LOW (ref 0.8–2.0)

## 2022-04-30 MED ORDER — ENTRESTO 97-103 MG PO TABS: 1.0000 | ORAL_TABLET | Freq: Two times a day (BID) | ORAL | 11 refills | Status: DC

## 2022-04-30 MED ORDER — POTASSIUM CHLORIDE CRYS ER 20 MEQ PO TBCR: 20.0000 meq | EXTENDED_RELEASE_TABLET | ORAL | 4 refills | Status: DC

## 2022-04-30 MED ORDER — FUROSEMIDE 20 MG PO TABS: 20.0000 mg | ORAL_TABLET | ORAL | 11 refills | Status: DC

## 2022-04-30 NOTE — Patient Instructions (Signed)
INCREASE Entresto to 97/103 mg Twice daily  DECREASE Lasix to 20mg  twice a week  DECREASE Potassium to twice a week.  Labs done today, your results will be available in MyChart, we will contact you for abnormal readings.  Your physician has recommended that you have a cardiopulmonary stress test (CPX). CPX testing is a non-invasive measurement of heart and lung function. It replaces a traditional treadmill stress test. This type of test provides a tremendous amount of information that relates not only to your present condition but also for future outcomes. This test combines measurements of you ventilation, respiratory gas exchange in the lungs, electrocardiogram (EKG), blood pressure and physical response before, during, and following an exercise protocol.   You are scheduled for a Cardiac Catheterization on Friday, February 2 with Dr. Loralie Champagne.  1. Please arrive at the [facility] (Main Entrance A) at [facility]: [address] [place], [place] [address] at 5:30 AM (This time is two hours before your procedure to ensure your preparation). Free valet parking service is available.   Special note: Every effort is made to have your procedure done on time. Please understand that emergencies sometimes delay scheduled procedures.  2. Diet: Do not eat solid foods after midnight.  The patient may have clear liquids until 5am upon the day of the procedure.  3. Medication instructions in preparation for your procedure:   Contrast Allergy: No   Take half your dose of Insulin the night before your procedure and hold it the morning of your procedure  Do not take Diabetes Med Glucophage (Metformin) on the day of the procedure and HOLD 48 HOURS AFTER THE PROCEDURE.  On the morning of your procedure, take yourmorning medicines NOT listed above.  You may use sips of water.  5. Plan for one night stay--bring personal belongings. 6. Bring a current list of your medications and current  insurance cards. 7. You MUST have a responsible person to drive you home. 8. Someone MUST be with you the first 24 hours after you arrive home or your discharge will be delayed. 9. Please wear clothes that are easy to get on and off and wear slip-on shoes.  Your physician recommends that you schedule a follow-up appointment in: 2 months  If you have any questions or concerns before your next appointment please send [place] a message through [place] or call our office at [phone].    TO LEAVE A MESSAGE FOR THE NURSE SELECT OPTION 2, PLEASE LEAVE A MESSAGE INCLUDING: YOUR NAME DATE OF BIRTH CALL BACK NUMBER REASON FOR CALL**this is important as we prioritize the call backs  YOU WILL RECEIVE A CALL BACK THE SAME DAY AS LONG AS YOU CALL BEFORE 4:00 PM  At the [facility], you and your health needs are our priority. As part of our continuing mission to provide you with exceptional heart care, we have created designated Provider Care Teams. These Care Teams include your primary Cardiologist (physician) and Advanced Practice Providers (APPs- Physician Assistants and Nurse Practitioners) who all work together to provide you with the care you need, when you need it.   You may see any of the following providers on your designated Care Team at your next follow up: Dr Glori Bickers Dr Loralie Champagne Dr. Roxana Hires, NP Lyda Jester, [place] [facility] [place], [place] Forestine Na, NP Audry Riles, PharmD   Please be sure to bring in all your medications bottles to every appointment.

## 2022-04-30 NOTE — Progress Notes (Signed)
[facility] PREP Weekly Session  Patient Details  Name: Jason Stevenson MRN: [id number] Date of Birth: [birth year] Age: 37 y.o. PCP: Ladell Pier, MD  Vitals:   04/30/22 1430  Weight: 161 lb 9.6 oz (73.3 kg)     [facility] Weekly seesion - 04/30/22 1600       [facility] "PREP" Location   [facility] "PREP" Location [facility]      Weekly Session   Topic Discussed Finding support    Minutes exercised this week 30 minutes    Classes attended to date 13             Barnett Hatter 04/30/2022, 4:01 PM

## 2022-04-30 NOTE — Progress Notes (Signed)
PCP: Dr. Karle Plumber Primary Cardiologist: Dr. Aundra Dubin  HPI: 37 y.o.male with diabetes, HTN, and a long history of nonischemic cardiomyopathy. He had a cardiac MRI in 1/08 showing low EF, but he says that he had been told about "heart problems" even prior to that.  He does not have a family history of cardiomyopathy that he knows of, but does not know his father's family.  Coronary CT angiogram in 2014 showed no CAD. Echo in 2/17 showed EF 20-25%, similar to 2014. Had repeat echo in 2020 showing LVEF 15-20%, RV normal. He had COVID-19 in 4/22.   He presented to [facility] ED 09/11/20 with tingling in left arm, left face, drooling and blank stare for about 1 week, and admitted for possible seizure, HHS and possible CVA.  In ED, he had left-sided neurologic deficit with dysarthria.  CT head with probable subacute infarct in right frontal lobe and old right parietal infarct.  Reportedly, patient's neurologic deficits resolved in ED, and focus shifted to possible seizure versus stroke.  He was loaded with Keppra.  MRI brain with probable hemorrhagic infarct involving the anterior right frontal lobe and left parietal occipital cortex with additional chronic right parietal infarct, right frontotemporal region enhancement suggesting changes related to acute seizure, possible toxic insult involving the caudate and lentiform nuclei bilaterally.  Neurology recommended interval follow-up brain MRI.  CTA head and neck without significant finding.  TTE with EF of 10-15%, LV thrombus, moderate RV dysfunction and dilated IVC.  He was discharged on GDMT, but A1c too high for SGLT2i.    Seen in [facility] ED 09/21/20 for fall after syncopal episode on toilet, hitting his head. CT of the head was significant for no bleeding but new low-density left cerebellum suggesting acute infarct. EEG unremarkable and felt fall secondary to vagal episode not seizure.  He had atypical chest pain at his hospital follow up. With his history of HLD and DM,  decisions was made to undergo [facility] to evaluate coronary anatomy and hemodynamics.  R/LHC (7/22) showed normal filling pressures, preserved cardiac output and no significant CAD. RA mean 1, RV 28/1, PA 19/7 mean 12, PCWP 3, CO/CI 5.54/2.92.  Again admitted 12/14/20-12/16/20 for syncopal episode. CT head negative. He was given IVF, beta blocker/spiro/Entresto held initially. Suspected orthostatic vs micturition syncope. EP consulted with known low EF and plans for ICD implant when a1c better controlled.  CPX in 9/22 showed severe HF limitation. Echo was done today and reviewed, EF < 20% with mild LV dilation, no LV thrombus noted, normal RV size with mildly decreased systolic function, IVC normal.   Followup 11/22, weight up about 10 lbs.  Patient had a seizure thought to be related to hypoglycemia (paramedics called, improved with glucose). Lasix 20 mg started every other day, eventually increased to daily.   [place] 3/23 at [facility] as part of pre-transplant work up showed normal pressures, RA 7, RV 23/4, PA 14, PCWP 11, CI 3.5.  Patient is currently being worked up for transplant listing.     Medtronic ICD placed 5/23.   Echo was done today and reviewed, EF < 20% with global hypokinesis, normal RV size and systolic function.   Patient returns for followup of CHF.  He is doing better symptomatically.  He has completed cardiac rehab and is enrolled in the [facility] class. He generally does ok walking on flat ground though will get short of breath walking long distances.  He gets fatigued and short of breath climbing stairs and gets short  of breath carrying a load.  No orthopnea/PND. No chest pain.  No lightheadedness.  No palpitations. HgbA1c has been better-controlled recently.   Labs (9/22): K 3.5, creatinine 0.85, digoxin 0.4 Labs (10/22): K 4.1, creatinine 0.8, digoxin 0.9 Labs (3/23): K 4.5, creatinine 0.9 Labs (4/23): K 4.6, creatinine 0.89 Labs (12/23): hgbA1c 7.6, K 3.8, creatinine 0.94  ECG  (personally reviewed): NSR, right axis deviation, anterolateral TWIs   ROS: All systems negative except as listed in HPI, PMH and Problem List.  PMH:  1. Type 1 diabetes: Generally poorly controlled.  2. 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  3. H/o LV thrombus 4. Syncope: Vagal vs orthostatic, likely not arrhythmic.  5. Diabetic polyneuropathy.  6. Chronic systolic CHF: Nonischemic cardiomyopathy.  Long-standing.  - Cardiac MRI (1/08): EF 45%, no late gadolinium enhancement.  - Coronary CTA (7/14): No significant CAD - Echo (7/14): EF 20-25%, severe LV dilation.  - Echo (6/22): EF 10-15%, LV thrombus, mild LV dilation, moderately decreased RV systolic function with normal size, mild MR, dilated IVC.  - LHC/RHC (7/22): No CAD; mean RA 1, PA 19/7, mean PCWP 3, CI 2.92.  - Echo (10/22): EF < 20%, mild LV dilation, normal RV size with mildly decreased systolic function, IVC normal.  - CPX (9/22): peak VO2 10.4, VE/VCO2 slope 26, RER 1.22 => severe HF limitation.  - RHC (3/23) at [facility] RA 7, RV 23/4, PA 14, PCWP 11, CI 3.5 - Medtronic ICD placed 5/23.  - Echo (1/24): EF < 20% with global hypokinesis, normal RV size and systolic function.  7. 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   SH:  Social History   Socioeconomic History   Marital status: Married    Spouse name: [name]   Number of children: 0   Years of education: 13   Highest education level: Some college, no degree  Occupational History   Occupation: unemployed  Tobacco Use   Smoking status: Never   Smokeless tobacco: Never  Vaping Use   Vaping Use: Not on file  Substance and Sexual Activity   Alcohol use: Not Currently    Comment: "occasional" when "hanging out with the wrong people" No recent use.   Drug use: Not Currently    Types: Marijuana    Comment: occasional, last 2013   Sexual activity: Not Currently  Other Topics Concern   Not on file  Social History Narrative   Pt  lives at home with his wife    Right handed    Caffeine- hardly any   Social Determinants of Health   Financial Resource Strain: High Risk (10/18/2021)   Overall Financial Resource Strain (CARDIA)    Difficulty of Paying Living Expenses: Hard  Food Insecurity: No Food Insecurity (09/14/2020)   Hunger Vital Sign    Worried About Running Out of Food in the Last Year: Never true    [place] in the Last Year: Never true  Recent Concern: Food Insecurity - Food Insecurity Present (09/13/2020)   Hunger Vital Sign    Worried About Running Out of Food in the Last Year: Never true    Ran Out of Food in the Last Year: Sometimes true  Transportation Needs: Unmet Transportation Needs (12/15/2020)   PRAPARE - Hydrologist (Medical): No    Lack of Transportation (Non-Medical): Yes  Physical Activity: Not on file  Stress: Not on file  Social Connections: Not on file  Intimate Partner  Violence: Not on file   FH:  Family History  Problem Relation Age of Onset   Stroke Mother    Diabetes Mother    Hypertension Mother    Stroke Maternal Aunt    Heart attack Neg Hx     Current Outpatient Medications  Medication Sig Dispense Refill   acetaminophen (TYLENOL) 325 MG tablet Take 1-2 tablets (325-650 mg total) by mouth every 4 (four) hours as needed for mild pain.     atorvastatin (LIPITOR) 80 MG tablet TAKE 1 TABLET (80 MG TOTAL) BY MOUTH DAILY (EVENING) 90 tablet 0   Blood Pressure Monitor DEVI Use as directed to check home blood pressure 2-3 times a week 1 Device 0   Continuous Blood Gluc Sensor (FREESTYLE LIBRE 3 SENSOR) MISC 1 applicator by Does not apply route every 14 (fourteen) days. 6 each 1   CORLANOR 5 MG TABS tablet TAKE 1 TABLET (5 MG TOTAL) BY MOUTH 2 (TWO) TIMES DAILY WITH A MEAL (AM+EVENING) 60 tablet 6   dapagliflozin propanediol (FARXIGA) 5 MG TABS tablet TAKE 1 TABLET (5 MG TOTAL) BY MOUTH DAILY BEFORE BREAKFAST. 30 tablet 11   dicyclomine (BENTYL) 20  MG tablet Take 1 tablet (20 mg total) by mouth 2 (two) times daily. 20 tablet 0   digoxin (LANOXIN) 0.125 MG tablet Take 1 tablet (0.125 mg total) by mouth daily. Please request Cardiologist for future refills. 30 tablet 6   DULoxetine (CYMBALTA) 30 MG capsule Take 1 capsule (30 mg total) by mouth daily. 30 capsule 5   Evolocumab (REPATHA SURECLICK) 275 MG/ML SOAJ Inject 140 mg into the skin every 14 (fourteen) days. 2 mL 11   ezetimibe (ZETIA) 10 MG tablet TAKE 1 TABLET (10 MG TOTAL) BY MOUTH DAILY (AM) 30 tablet 3   glucose blood (ACCU-CHEK GUIDE) test strip Use to check blood sugar three times daily. 100 each 2   insulin aspart (FIASP FLEXTOUCH) 100 UNIT/ML FlexTouch Pen Inject 5-8 Units into the skin 3 (three) times daily before meals. 30 mL 3   Insulin Pen Needle 32G X 4 MM MISC Use to inject insulin. 200 each 2   Insulin Syringe-Needle U-100 (RELION INSULIN SYRINGE) 31G X 15/64" 0.3 ML MISC Use to inject insulin daily. 100 each 11   levETIRAcetam (KEPPRA) 750 MG tablet Take 2 tablets (1,500 mg total) by mouth 2 (two) times daily. 360 tablet 4   loperamide (IMODIUM) 2 MG capsule Take 1 capsule (2 mg total) by mouth 4 (four) times daily as needed for diarrhea or loose stools. 12 capsule 0   Menthol, Topical Analgesic, (ICY HOT BACK EX) Apply 1 application topically as needed (neuropathy). Icy hot cream     metFORMIN (GLUCOPHAGE-XR) 500 MG 24 hr tablet Take 1 tablet (500 mg total) by mouth 2 (two) times daily with a meal. 180 tablet 3   metoprolol (TOPROL-XL) 200 MG 24 hr tablet TAKE 0.5 TABLETS (100 MG TOTAL) BY MOUTH DAILY. TAKE WITH OR IMMEDIATELY FOLLOWING A MEAL (EVENING) 15 tablet 6   Oxcarbazepine (TRILEPTAL) 300 MG tablet Take 2 tablets (600 mg total) by mouth 2 (two) times daily. 360 tablet 4   phenytoin (DILANTIN) 100 MG ER capsule Take 1 capsule (100 mg total) by mouth 3 (three) times daily. 270 capsule 4   pregabalin (LYRICA) 300 MG capsule Take 1 capsule (300 mg total) by mouth at  bedtime. 30 capsule 5   sacubitril-valsartan (ENTRESTO) 97-103 MG Take 1 tablet by mouth 2 (two) times daily. 60 tablet 11  sildenafil (VIAGRA) 50 MG tablet 1 tab p.o. half hour prior to intercourse PRN.  Limit use to 1 tab/24 hr 10 tablet 6   spironolactone (ALDACTONE) 25 MG tablet TAKE 1 TABLET (25 MG TOTAL) BY MOUTH AT BEDTIME. 90 tablet 1   TRESIBA FLEXTOUCH 200 UNIT/ML FlexTouch Pen INJECT 20 UNITS INTO THE SKIN DAILY. 18 mL 3   warfarin (COUMADIN) 5 MG tablet Take 1-1.5 tablets (5-7.5 mg total) by mouth daily. 40 tablet 3   [START ON 05/02/2022] furosemide (LASIX) 20 MG tablet Take 1 tablet (20 mg total) by mouth 2 (two) times a week. Mondays Wednesdays and Friday 30 tablet 11   [START ON 05/02/2022] potassium chloride SA (KLOR-CON M) 20 MEQ tablet Take 1 tablet (20 mEq total) by mouth 2 (two) times a week. Take every Mon, Wed and Fri with Lasix 30 tablet 4   No current facility-administered medications for this encounter.   BP 132/86   Pulse 66   Ht 5\' 11"  (1.803 m)   Wt 73.8 kg (162 lb 9.6 oz)   SpO2 98%   BMI 22.68 kg/m   Wt Readings from Last 3 Encounters:  04/30/22 73.8 kg (162 lb 9.6 oz)  04/30/22 73.3 kg (161 lb 9.6 oz)  04/01/22 71 kg (156 lb 9.6 oz)   PHYSICAL EXAM: General: NAD Neck: No JVD, no thyromegaly or thyroid nodule.  Lungs: Clear to auscultation bilaterally with normal respiratory effort. CV: Lateral PMI.  Heart regular S1/S2, no S3/S4, no murmur.  No peripheral edema.  No carotid bruit.  Normal pedal pulses.  Abdomen: Soft, nontender, no hepatosplenomegaly, no distention.  Skin: Intact without lesions or rashes.  Neurologic: Alert and oriented x 3.  Psych: Normal affect. Extremities: No clubbing or cyanosis.  HEENT: Normal.   ASSESSMENT & PLAN:  1. Chronic systolic CHF: Nonischemic cardiomyopathy known for years (since at least 2008).  Cardiac MRI in 2008 with EF 45%, no LGE.  Normal coronary CTA in 2014.  Echo (7/22) with EF 10-15%, LV thrombus, moderate  RV dysfunction, dilated IVC. R/LHC (6/22) showed normal filling pressures, preserved cardiac output and no significant CAD. CPX in 9/22 showed severe HF limitation concerning for advanced HF.  Echo in 9/22 showed EF < 20% with mild LV dilation, no LV thrombus noted, normal RV size with mildly decreased systolic function, IVC normal. Medtronic ICD placed 5/23. Echo today showed EF < 20% with global hypokinesis, normal RV size and systolic function.  NYHA class III symptoms chronically but overall better recently, he is not volume overloaded on exam. Ongoing workup at [facility] for heart transplant. He is blood type A.  - Continue spironolactone 25 mg qhs.  - Continue Farxiga 5 mg daily (this was started by endocrinology), will check with his endocrinologist to see if this can be increased to 10 mg daily. - Increase Entresto to 97/103 bid and decrease Lasix to twice a week and KCl to twice a week.   - Continue Toprol XL 100 mg daily. - Continue Ivabradine to 5 mg bid.  - Continue digoxin 0.125. Check digoxin level today. - Though cardiac output was preserved on 6/22 and 3/23 RHCs, CPX in 9/22 showed a severe HF limitation concerning for advanced HF. He has been seen by [facility] for consideration of transplant.  Ongoing workup.  - I will arrange for repeat CPX.  - I will arrange for RHC as part of ongoing transplant evaluation.  We discussed risks/benefits and he agrees to procedure.  2. Syncope: Multiple prior  episodes though none recently.  ?Orthostatic-related vs related to hypo- or hyperglycemia, no arrhythmia has been noted.  He has an ICD.  3. Diabetes: Insulin-dependent, started in his 39s.  Control better recently but has been poor in past.  - Followed by endocrinology. Now on Farxiga, will check to see if ok to increase to 10 mg daily. 4. 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  - On keppra, dilantin 5. 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  Followed by  Neurology. - On Coumadin. - On statin, check lipids today.  6. LV Thrombus: with associated CVA. Today's echo did not show a thrombus.  - On Coumadin. No bleeding issues.  Loralie Champagne 04/30/2022

## 2022-04-30 NOTE — H&P (View-Only) (Signed)
PCP: Dr. Karle Plumber Primary Cardiologist: Dr. Aundra Dubin  HPI: 37 y.o.male with diabetes, HTN, and a long history of nonischemic cardiomyopathy. He had a cardiac MRI in 1/08 showing low EF, but he says that he had been told about "heart problems" even prior to that.  He does not have a family history of cardiomyopathy that he knows of, but does not know his father's family.  Coronary CT angiogram in 2014 showed no CAD. Echo in 2/17 showed EF 20-25%, similar to 2014. Had repeat echo in 2020 showing LVEF 15-20%, RV normal. He had COVID-19 in 4/22.   He presented to [facility] ED 09/11/20 with tingling in left arm, left face, drooling and blank stare for about 1 week, and admitted for possible seizure, HHS and possible CVA.  In ED, he had left-sided neurologic deficit with dysarthria.  CT head with probable subacute infarct in right frontal lobe and old right parietal infarct.  Reportedly, patient's neurologic deficits resolved in ED, and focus shifted to possible seizure versus stroke.  He was loaded with Keppra.  MRI brain with probable hemorrhagic infarct involving the anterior right frontal lobe and left parietal occipital cortex with additional chronic right parietal infarct, right frontotemporal region enhancement suggesting changes related to acute seizure, possible toxic insult involving the caudate and lentiform nuclei bilaterally.  Neurology recommended interval follow-up brain MRI.  CTA head and neck without significant finding.  TTE with EF of 10-15%, LV thrombus, moderate RV dysfunction and dilated IVC.  He was discharged on GDMT, but A1c too high for SGLT2i.    Seen in [facility] ED 09/21/20 for fall after syncopal episode on toilet, hitting his head. CT of the head was significant for no bleeding but new low-density left cerebellum suggesting acute infarct. EEG unremarkable and felt fall secondary to vagal episode not seizure.  He had atypical chest pain at his hospital follow up. With his history of HLD and DM,  decisions was made to undergo [facility] to evaluate coronary anatomy and hemodynamics.  R/LHC (7/22) showed normal filling pressures, preserved cardiac output and no significant CAD. RA mean 1, RV 28/1, PA 19/7 mean 12, PCWP 3, CO/CI 5.54/2.92.  Again admitted 12/14/20-12/16/20 for syncopal episode. CT head negative. He was given IVF, beta blocker/spiro/Entresto held initially. Suspected orthostatic vs micturition syncope. EP consulted with known low EF and plans for ICD implant when a1c better controlled.  CPX in 9/22 showed severe HF limitation. Echo was done today and reviewed, EF < 20% with mild LV dilation, no LV thrombus noted, normal RV size with mildly decreased systolic function, IVC normal.   Followup 11/22, weight up about 10 lbs.  Patient had a seizure thought to be related to hypoglycemia (paramedics called, improved with glucose). Lasix 20 mg started every other day, eventually increased to daily.   [place] 3/23 at [facility] as part of pre-transplant work up showed normal pressures, RA 7, RV 23/4, PA 14, PCWP 11, CI 3.5.  Patient is currently being worked up for transplant listing.     Medtronic ICD placed 5/23.   Echo was done today and reviewed, EF < 20% with global hypokinesis, normal RV size and systolic function.   Patient returns for followup of CHF.  He is doing better symptomatically.  He has completed cardiac rehab and is enrolled in the [facility] class. He generally does ok walking on flat ground though will get short of breath walking long distances.  He gets fatigued and short of breath climbing stairs and gets short  of breath carrying a load.  No orthopnea/PND. No chest pain.  No lightheadedness.  No palpitations. HgbA1c has been better-controlled recently.   Labs (9/22): K 3.5, creatinine 0.85, digoxin 0.4 Labs (10/22): K 4.1, creatinine 0.8, digoxin 0.9 Labs (3/23): K 4.5, creatinine 0.9 Labs (4/23): K 4.6, creatinine 0.89 Labs (12/23): hgbA1c 7.6, K 3.8, creatinine 0.94  ECG  (personally reviewed): NSR, right axis deviation, anterolateral TWIs   ROS: All systems negative except as listed in HPI, PMH and Problem List.  PMH:  1. Type 1 diabetes: Generally poorly controlled.  2. 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  3. H/o LV thrombus 4. Syncope: Vagal vs orthostatic, likely not arrhythmic.  5. Diabetic polyneuropathy.  6. Chronic systolic CHF: Nonischemic cardiomyopathy.  Long-standing.  - Cardiac MRI (1/08): EF 45%, no late gadolinium enhancement.  - Coronary CTA (7/14): No significant CAD - Echo (7/14): EF 20-25%, severe LV dilation.  - Echo (6/22): EF 10-15%, LV thrombus, mild LV dilation, moderately decreased RV systolic function with normal size, mild MR, dilated IVC.  - LHC/RHC (7/22): No CAD; mean RA 1, PA 19/7, mean PCWP 3, CI 2.92.  - Echo (10/22): EF < 20%, mild LV dilation, normal RV size with mildly decreased systolic function, IVC normal.  - CPX (9/22): peak VO2 10.4, VE/VCO2 slope 26, RER 1.22 => severe HF limitation.  - RHC (3/23) at [facility] RA 7, RV 23/4, PA 14, PCWP 11, CI 3.5 - Medtronic ICD placed 5/23.  - Echo (1/24): EF < 20% with global hypokinesis, normal RV size and systolic function.  7. 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   SH:  Social History   Socioeconomic History   Marital status: Married    Spouse name: [name]   Number of children: 0   Years of education: 13   Highest education level: Some college, no degree  Occupational History   Occupation: unemployed  Tobacco Use   Smoking status: Never   Smokeless tobacco: Never  Vaping Use   Vaping Use: Not on file  Substance and Sexual Activity   Alcohol use: Not Currently    Comment: "occasional" when "hanging out with the wrong people" No recent use.   Drug use: Not Currently    Types: Marijuana    Comment: occasional, last 2013   Sexual activity: Not Currently  Other Topics Concern   Not on file  Social History Narrative   Pt  lives at home with his wife    Right handed    Caffeine- hardly any   Social Determinants of Health   Financial Resource Strain: High Risk (10/18/2021)   Overall Financial Resource Strain (CARDIA)    Difficulty of Paying Living Expenses: Hard  Food Insecurity: No Food Insecurity (09/14/2020)   Hunger Vital Sign    Worried About Running Out of Food in the Last Year: Never true    [place] in the Last Year: Never true  Recent Concern: Food Insecurity - Food Insecurity Present (09/13/2020)   Hunger Vital Sign    Worried About Running Out of Food in the Last Year: Never true    Ran Out of Food in the Last Year: Sometimes true  Transportation Needs: Unmet Transportation Needs (12/15/2020)   PRAPARE - Hydrologist (Medical): No    Lack of Transportation (Non-Medical): Yes  Physical Activity: Not on file  Stress: Not on file  Social Connections: Not on file  Intimate Partner  Violence: Not on file   FH:  Family History  Problem Relation Age of Onset   Stroke Mother    Diabetes Mother    Hypertension Mother    Stroke Maternal Aunt    Heart attack Neg Hx     Current Outpatient Medications  Medication Sig Dispense Refill   acetaminophen (TYLENOL) 325 MG tablet Take 1-2 tablets (325-650 mg total) by mouth every 4 (four) hours as needed for mild pain.     atorvastatin (LIPITOR) 80 MG tablet TAKE 1 TABLET (80 MG TOTAL) BY MOUTH DAILY (EVENING) 90 tablet 0   Blood Pressure Monitor DEVI Use as directed to check home blood pressure 2-3 times a week 1 Device 0   Continuous Blood Gluc Sensor (FREESTYLE LIBRE 3 SENSOR) MISC 1 applicator by Does not apply route every 14 (fourteen) days. 6 each 1   CORLANOR 5 MG TABS tablet TAKE 1 TABLET (5 MG TOTAL) BY MOUTH 2 (TWO) TIMES DAILY WITH A MEAL (AM+EVENING) 60 tablet 6   dapagliflozin propanediol (FARXIGA) 5 MG TABS tablet TAKE 1 TABLET (5 MG TOTAL) BY MOUTH DAILY BEFORE BREAKFAST. 30 tablet 11   dicyclomine (BENTYL) 20  MG tablet Take 1 tablet (20 mg total) by mouth 2 (two) times daily. 20 tablet 0   digoxin (LANOXIN) 0.125 MG tablet Take 1 tablet (0.125 mg total) by mouth daily. Please request Cardiologist for future refills. 30 tablet 6   DULoxetine (CYMBALTA) 30 MG capsule Take 1 capsule (30 mg total) by mouth daily. 30 capsule 5   Evolocumab (REPATHA SURECLICK) 275 MG/ML SOAJ Inject 140 mg into the skin every 14 (fourteen) days. 2 mL 11   ezetimibe (ZETIA) 10 MG tablet TAKE 1 TABLET (10 MG TOTAL) BY MOUTH DAILY (AM) 30 tablet 3   glucose blood (ACCU-CHEK GUIDE) test strip Use to check blood sugar three times daily. 100 each 2   insulin aspart (FIASP FLEXTOUCH) 100 UNIT/ML FlexTouch Pen Inject 5-8 Units into the skin 3 (three) times daily before meals. 30 mL 3   Insulin Pen Needle 32G X 4 MM MISC Use to inject insulin. 200 each 2   Insulin Syringe-Needle U-100 (RELION INSULIN SYRINGE) 31G X 15/64" 0.3 ML MISC Use to inject insulin daily. 100 each 11   levETIRAcetam (KEPPRA) 750 MG tablet Take 2 tablets (1,500 mg total) by mouth 2 (two) times daily. 360 tablet 4   loperamide (IMODIUM) 2 MG capsule Take 1 capsule (2 mg total) by mouth 4 (four) times daily as needed for diarrhea or loose stools. 12 capsule 0   Menthol, Topical Analgesic, (ICY HOT BACK EX) Apply 1 application topically as needed (neuropathy). Icy hot cream     metFORMIN (GLUCOPHAGE-XR) 500 MG 24 hr tablet Take 1 tablet (500 mg total) by mouth 2 (two) times daily with a meal. 180 tablet 3   metoprolol (TOPROL-XL) 200 MG 24 hr tablet TAKE 0.5 TABLETS (100 MG TOTAL) BY MOUTH DAILY. TAKE WITH OR IMMEDIATELY FOLLOWING A MEAL (EVENING) 15 tablet 6   Oxcarbazepine (TRILEPTAL) 300 MG tablet Take 2 tablets (600 mg total) by mouth 2 (two) times daily. 360 tablet 4   phenytoin (DILANTIN) 100 MG ER capsule Take 1 capsule (100 mg total) by mouth 3 (three) times daily. 270 capsule 4   pregabalin (LYRICA) 300 MG capsule Take 1 capsule (300 mg total) by mouth at  bedtime. 30 capsule 5   sacubitril-valsartan (ENTRESTO) 97-103 MG Take 1 tablet by mouth 2 (two) times daily. 60 tablet 11  sildenafil (VIAGRA) 50 MG tablet 1 tab p.o. half hour prior to intercourse PRN.  Limit use to 1 tab/24 hr 10 tablet 6   spironolactone (ALDACTONE) 25 MG tablet TAKE 1 TABLET (25 MG TOTAL) BY MOUTH AT BEDTIME. 90 tablet 1   TRESIBA FLEXTOUCH 200 UNIT/ML FlexTouch Pen INJECT 20 UNITS INTO THE SKIN DAILY. 18 mL 3   warfarin (COUMADIN) 5 MG tablet Take 1-1.5 tablets (5-7.5 mg total) by mouth daily. 40 tablet 3   [START ON 05/02/2022] furosemide (LASIX) 20 MG tablet Take 1 tablet (20 mg total) by mouth 2 (two) times a week. Mondays Wednesdays and Friday 30 tablet 11   [START ON 05/02/2022] potassium chloride SA (KLOR-CON M) 20 MEQ tablet Take 1 tablet (20 mEq total) by mouth 2 (two) times a week. Take every Mon, Wed and Fri with Lasix 30 tablet 4   No current facility-administered medications for this encounter.   BP 132/86   Pulse 66   Ht 5' 11" (1.803 m)   Wt 73.8 kg (162 lb 9.6 oz)   SpO2 98%   BMI 22.68 kg/m   Wt Readings from Last 3 Encounters:  04/30/22 73.8 kg (162 lb 9.6 oz)  04/30/22 73.3 kg (161 lb 9.6 oz)  04/01/22 71 kg (156 lb 9.6 oz)   PHYSICAL EXAM: General: NAD Neck: No JVD, no thyromegaly or thyroid nodule.  Lungs: Clear to auscultation bilaterally with normal respiratory effort. CV: Lateral PMI.  Heart regular S1/S2, no S3/S4, no murmur.  No peripheral edema.  No carotid bruit.  Normal pedal pulses.  Abdomen: Soft, nontender, no hepatosplenomegaly, no distention.  Skin: Intact without lesions or rashes.  Neurologic: Alert and oriented x 3.  Psych: Normal affect. Extremities: No clubbing or cyanosis.  HEENT: Normal.   ASSESSMENT & PLAN:  1. Chronic systolic CHF: Nonischemic cardiomyopathy known for years (since at least 2008).  Cardiac MRI in 2008 with EF 45%, no LGE.  Normal coronary CTA in 2014.  Echo (7/22) with EF 10-15%, LV thrombus, moderate  RV dysfunction, dilated IVC. R/LHC (6/22) showed normal filling pressures, preserved cardiac output and no significant CAD. CPX in 9/22 showed severe HF limitation concerning for advanced HF.  Echo in 9/22 showed EF < 20% with mild LV dilation, no LV thrombus noted, normal RV size with mildly decreased systolic function, IVC normal. Medtronic ICD placed 5/23. Echo today showed EF < 20% with global hypokinesis, normal RV size and systolic function.  NYHA class III symptoms chronically but overall better recently, he is not volume overloaded on exam. Ongoing workup at [facility] for heart transplant. He is blood type A.  - Continue spironolactone 25 mg qhs.  - Continue Farxiga 5 mg daily (this was started by endocrinology), will check with his endocrinologist to see if this can be increased to 10 mg daily. - Increase Entresto to 97/103 bid and decrease Lasix to twice a week and KCl to twice a week.   - Continue Toprol XL 100 mg daily. - Continue Ivabradine to 5 mg bid.  - Continue digoxin 0.125. Check digoxin level today. - Though cardiac output was preserved on 6/22 and 3/23 RHCs, CPX in 9/22 showed a severe HF limitation concerning for advanced HF. He has been seen by [facility] for consideration of transplant.  Ongoing workup.  - I will arrange for repeat CPX.  - I will arrange for RHC as part of ongoing transplant evaluation.  We discussed risks/benefits and he agrees to procedure.  2. Syncope: Multiple prior   episodes though none recently.  ?Orthostatic-related vs related to hypo- or hyperglycemia, no arrhythmia has been noted.  He has an ICD.  3. Diabetes: Insulin-dependent, started in his 39s.  Control better recently but has been poor in past.  - Followed by endocrinology. Now on Farxiga, will check to see if ok to increase to 10 mg daily. 4. 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  - On keppra, dilantin 5. 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  Followed by  Neurology. - On Coumadin. - On statin, check lipids today.  6. LV Thrombus: with associated CVA. Today's echo did not show a thrombus.  - On Coumadin. No bleeding issues.  Loralie Champagne 04/30/2022

## 2022-04-30 NOTE — Progress Notes (Signed)
Echocardiogram 2D Echocardiogram has been performed.  Jason Stevenson 04/30/2022, 10:02 AM

## 2022-05-02 ENCOUNTER — Other Ambulatory Visit (HOSPITAL_COMMUNITY): Payer: Self-pay

## 2022-05-02 NOTE — Telephone Encounter (Signed)
No information from [facility] Medicaid yet. I can call.

## 2022-05-02 NOTE — Telephone Encounter (Signed)
Pharmacy Patient Advocate Encounter   Called [facility]. They aren't able to see the PA form I faxed. The rep I talked to said to choose the reader for the 2 on the electronic form. She was able to see the form when I submitted it. She noted on it that it was for the 3 and we just have to wait for the pharmacist to review.    PA submitted on 05/02/22 to (ins) [facility] via [facility] Confirmation #  [id number] W Status is pending

## 2022-05-06 ENCOUNTER — Other Ambulatory Visit (HOSPITAL_COMMUNITY): Payer: Self-pay

## 2022-05-06 ENCOUNTER — Ambulatory Visit: Payer: Medicaid Other | Attending: Cardiology | Admitting: *Deleted

## 2022-05-06 DIAGNOSIS — Z5181 Encounter for therapeutic drug level monitoring: Secondary | ICD-10-CM | POA: Diagnosis not present

## 2022-05-06 DIAGNOSIS — I639 Cerebral infarction, unspecified: Secondary | ICD-10-CM | POA: Diagnosis not present

## 2022-05-06 DIAGNOSIS — Z7901 Long term (current) use of anticoagulants: Secondary | ICD-10-CM

## 2022-05-06 DIAGNOSIS — I513 Intracardiac thrombosis, not elsewhere classified: Secondary | ICD-10-CM

## 2022-05-06 LAB — POCT INR: POC INR: 2.6

## 2022-05-06 NOTE — Patient Instructions (Signed)
Description   Continue taking Warfarin 1.5 tablets daily except for 1 tablet on Mondays, Wednesday and Fridays. Stay consistent with leafy veggies. Recheck INR in 3 weeks. [facility] [phone]

## 2022-05-08 ENCOUNTER — Encounter: Payer: Self-pay | Admitting: Physician Assistant

## 2022-05-08 ENCOUNTER — Ambulatory Visit: Payer: Medicaid Other | Attending: Physician Assistant | Admitting: Physician Assistant

## 2022-05-08 VITALS — BP 115/82 | HR 87 | Ht 71.0 in | Wt 158.0 lb

## 2022-05-08 DIAGNOSIS — Z7901 Long term (current) use of anticoagulants: Secondary | ICD-10-CM | POA: Insufficient documentation

## 2022-05-08 DIAGNOSIS — I110 Hypertensive heart disease with heart failure: Secondary | ICD-10-CM | POA: Insufficient documentation

## 2022-05-08 DIAGNOSIS — Z794 Long term (current) use of insulin: Secondary | ICD-10-CM | POA: Insufficient documentation

## 2022-05-08 DIAGNOSIS — Z95810 Presence of automatic (implantable) cardiac defibrillator: Secondary | ICD-10-CM | POA: Insufficient documentation

## 2022-05-08 DIAGNOSIS — Z09 Encounter for follow-up examination after completed treatment for conditions other than malignant neoplasm: Secondary | ICD-10-CM | POA: Diagnosis not present

## 2022-05-08 DIAGNOSIS — I255 Ischemic cardiomyopathy: Secondary | ICD-10-CM | POA: Insufficient documentation

## 2022-05-08 DIAGNOSIS — Z8673 Personal history of transient ischemic attack (TIA), and cerebral infarction without residual deficits: Secondary | ICD-10-CM | POA: Diagnosis not present

## 2022-05-08 DIAGNOSIS — G40909 Epilepsy, unspecified, not intractable, without status epilepticus: Secondary | ICD-10-CM | POA: Insufficient documentation

## 2022-05-08 DIAGNOSIS — Z79899 Other long term (current) drug therapy: Secondary | ICD-10-CM | POA: Diagnosis not present

## 2022-05-08 DIAGNOSIS — E1142 Type 2 diabetes mellitus with diabetic polyneuropathy: Secondary | ICD-10-CM | POA: Insufficient documentation

## 2022-05-08 DIAGNOSIS — R55 Syncope and collapse: Secondary | ICD-10-CM | POA: Diagnosis not present

## 2022-05-08 DIAGNOSIS — Z7984 Long term (current) use of oral hypoglycemic drugs: Secondary | ICD-10-CM | POA: Insufficient documentation

## 2022-05-08 DIAGNOSIS — I5022 Chronic systolic (congestive) heart failure: Secondary | ICD-10-CM | POA: Diagnosis not present

## 2022-05-08 DIAGNOSIS — E785 Hyperlipidemia, unspecified: Secondary | ICD-10-CM | POA: Diagnosis not present

## 2022-05-08 LAB — GLUCOSE, POCT (MANUAL RESULT ENTRY): POC Glucose: 125 mg/dl — AB (ref 70–99)

## 2022-05-08 NOTE — Progress Notes (Signed)
Patient ID: Jason Stevenson, male   DOB: [birth year], 37 y.o.   MRN: [id number]      Jason Stevenson, is a 37 y.o. male  [id number]  [id number]  DOB - Aug 12, 1985  Chief Complaint  Patient presents with   Hospitalization Follow-up   Diabetes       Subjective:   Jason Stevenson is a 37 y.o. male here today for a follow up visit after being seen in the ED 12/28 for acute bronchitis.  Has known cardiomyopathy followed by cardiology and undergoing workup for possible transplant at [facility].  Also followed by coumading clinic.  Today no SOB/CP.  Feeling good.  Appetite is good.  Does not need RF.  Last A1C about 1 month ago and 7.6.    Saw cardiology 1/16/202-from A/P: ASSESSMENT & PLAN:  1. Chronic systolic CHF: Nonischemic cardiomyopathy known for years (since at least 2008).  Cardiac MRI in 2008 with EF 45%, no LGE.  Normal coronary CTA in 2014.  Echo (7/22) with EF 10-15%, LV thrombus, moderate RV dysfunction, dilated IVC. R/LHC (6/22) showed normal filling pressures, preserved cardiac output and no significant CAD. CPX in 9/22 showed severe HF limitation concerning for advanced HF.  Echo in 9/22 showed EF < 20% with mild LV dilation, no LV thrombus noted, normal RV size with mildly decreased systolic function, IVC normal. Medtronic ICD placed 5/23. Echo today showed EF < 20% with global hypokinesis, normal RV size and systolic function.  NYHA class III symptoms chronically but overall better recently, he is not volume overloaded on exam. Ongoing workup at [facility] for heart transplant. He is blood type A.  - Continue spironolactone 25 mg qhs.  - Continue Farxiga 5 mg daily (this was started by endocrinology), will check with his endocrinologist to see if this can be increased to 10 mg daily. - Increase Entresto to 97/103 bid and decrease Lasix to twice a week and KCl to twice a week.   - Continue Toprol XL 100 mg daily. - Continue Ivabradine to 5 mg bid.  - Continue digoxin 0.125. Check  digoxin level today. - Though cardiac output was preserved on 6/22 and 3/23 RHCs, CPX in 9/22 showed a severe HF limitation concerning for advanced HF. He has been seen by [facility] for consideration of transplant.  Ongoing workup.  - I will arrange for repeat CPX.  - I will arrange for RHC as part of ongoing transplant evaluation.  We discussed risks/benefits and he agrees to procedure.  2. Syncope: Multiple prior episodes though none recently.  ?Orthostatic-related vs related to hypo- or hyperglycemia, no arrhythmia has been noted.  He has an ICD.  3. Diabetes: Insulin-dependent, started in his 78s.  Control better recently but has been poor in past.  - Followed by endocrinology. Now on Farxiga, will check to see if ok to increase to 10 mg daily. 4. 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  - On keppra, dilantin 5. 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  Followed by Neurology. - On Coumadin. - On statin, check lipids today.  6. LV Thrombus: with associated CVA. Today's echo did not show a thrombus.  - On Coumadin. No bleeding issues. No problems updated.  ALLERGIES: No Known Allergies  PAST MEDICAL HISTORY: Past Medical History:  Diagnosis Date   Chronic systolic CHF (congestive heart failure) ([place]) 06/18/2015   Hemorrhagic stroke (HCC)    Hyperlipidemia    Hypertension    Immune deficiency disorder ([place])  Nonischemic cardiomyopathy (HCC) Noted as early as 2007   Per chart review (cards consult note 2011), EF of 40% in 2007, down to 20-25% in 2011    MEDICATIONS AT HOME: Prior to Admission medications   Medication Sig Start Date End Date Taking? Authorizing Provider  acetaminophen (TYLENOL) 325 MG tablet Take 1-2 tablets (325-650 mg total) by mouth every 4 (four) hours as needed for mild pain. 08/28/21  Yes Graciella Freer, PA-C  atorvastatin (LIPITOR) 80 MG tablet TAKE 1 TABLET (80 MG TOTAL) BY MOUTH DAILY (EVENING) 01/11/22  Yes  Marcine Matar, MD  Blood Pressure Monitor DEVI Use as directed to check home blood pressure 2-3 times a week 10/12/18  Yes Marcine Matar, MD  Continuous Blood Gluc Sensor (FREESTYLE LIBRE 3 SENSOR) MISC 1 applicator by Does not apply route every 14 (fourteen) days. 03/27/22  Yes Carlus Pavlov, MD  CORLANOR 5 MG TABS tablet TAKE 1 TABLET (5 MG TOTAL) BY MOUTH 2 (TWO) TIMES DAILY WITH A MEAL (AM+EVENING) 02/25/22  Yes [place], [place], FNP  dapagliflozin propanediol (FARXIGA) 5 MG TABS tablet TAKE 1 TABLET (5 MG TOTAL) BY MOUTH DAILY BEFORE BREAKFAST. 04/25/22  Yes Laurey Morale, MD  dicyclomine (BENTYL) 20 MG tablet Take 1 tablet (20 mg total) by mouth 2 (two) times daily. 03/19/22  Yes Henderly, Britni A, PA-C  digoxin (LANOXIN) 0.125 MG tablet Take 1 tablet (0.125 mg total) by mouth daily. Please request Cardiologist for future refills. 12/14/21  Yes Laurey Morale, MD  DULoxetine (CYMBALTA) 30 MG capsule Take 1 capsule (30 mg total) by mouth daily. 02/28/22  Yes Marcine Matar, MD  Evolocumab (REPATHA SURECLICK) 140 MG/ML SOAJ Inject 140 mg into the skin every 14 (fourteen) days. 11/16/21  Yes Laurey Morale, MD  ezetimibe (ZETIA) 10 MG tablet TAKE 1 TABLET (10 MG TOTAL) BY MOUTH DAILY (AM) 03/27/22  Yes Laurey Morale, MD  furosemide (LASIX) 20 MG tablet Take 1 tablet (20 mg total) by mouth 2 (two) times a week. Mondays Wednesdays and Friday 05/02/22  Yes Laurey Morale, MD  glucose blood (ACCU-CHEK GUIDE) test strip Use to check blood sugar three times daily. 04/05/21  Yes Marcine Matar, MD  insulin aspart (FIASP FLEXTOUCH) 100 UNIT/ML FlexTouch Pen Inject 5-8 Units into the skin 3 (three) times daily before meals. 04/19/21  Yes Carlus Pavlov, MD  Insulin Pen Needle 32G X 4 MM MISC Use to inject insulin. 04/05/21  Yes Marcine Matar, MD  Insulin Syringe-Needle U-100 (RELION INSULIN SYRINGE) 31G X 15/64" 0.3 ML MISC Use to inject insulin daily. 07/13/18  Yes Marcine Matar, MD  levETIRAcetam (KEPPRA) 750 MG tablet Take 2 tablets (1,500 mg total) by mouth 2 (two) times daily. 06/26/21 09/19/22 Yes Penumalli, Glenford Bayley, MD  loperamide (IMODIUM) 2 MG capsule Take 1 capsule (2 mg total) by mouth 4 (four) times daily as needed for diarrhea or loose stools. 03/19/22  Yes Henderly, Britni A, PA-C  Menthol, Topical Analgesic, (ICY HOT BACK EX) Apply 1 application topically as needed (neuropathy). Icy hot cream   Yes [provider]  metFORMIN (GLUCOPHAGE-XR) 500 MG 24 hr tablet Take 1 tablet (500 mg total) by mouth 2 (two) times daily with a meal. 07/17/21  Yes Carlus Pavlov, MD  metoprolol (TOPROL-XL) 200 MG 24 hr tablet TAKE 0.5 TABLETS (100 MG TOTAL) BY MOUTH DAILY. TAKE WITH OR IMMEDIATELY FOLLOWING A MEAL (EVENING) 12/14/21  Yes Laurey Morale, MD  Oxcarbazepine (TRILEPTAL)  300 MG tablet Take 2 tablets (600 mg total) by mouth 2 (two) times daily. 06/26/21 09/19/22 Yes Penumalli, Earlean Polka, MD  phenytoin (DILANTIN) 100 MG ER capsule Take 1 capsule (100 mg total) by mouth 3 (three) times daily. 06/26/21 09/19/22 Yes Penumalli, Earlean Polka, MD  potassium chloride SA (KLOR-CON M) 20 MEQ tablet Take 1 tablet (20 mEq total) by mouth 2 (two) times a week. Take every Mon, Wed and Fri with Lasix 05/02/22  Yes Larey Dresser, MD  pregabalin (LYRICA) 300 MG capsule Take 1 capsule (300 mg total) by mouth at bedtime. 02/28/22  Yes Ladell Pier, MD  sacubitril-valsartan (ENTRESTO) 97-103 MG Take 1 tablet by mouth 2 (two) times daily. 04/30/22  Yes Larey Dresser, MD  sildenafil (VIAGRA) 50 MG tablet 1 tab p.o. half hour prior to intercourse PRN.  Limit use to 1 tab/24 hr 03/19/22  Yes Ladell Pier, MD  spironolactone (ALDACTONE) 25 MG tablet TAKE 1 TABLET (25 MG TOTAL) BY MOUTH AT BEDTIME. 03/27/22  Yes Ladell Pier, MD  TRESIBA FLEXTOUCH 200 UNIT/ML FlexTouch Pen INJECT 20 UNITS INTO THE SKIN DAILY. 01/29/22  Yes Philemon Kingdom, MD  warfarin (COUMADIN) 5 MG  tablet Take 1-1.5 tablets (5-7.5 mg total) by mouth daily. 01/14/22  Yes Camnitz, Will Hassell Done, MD    ROS: Neg HEENT Neg resp Neg cardiac Neg GI Neg GU Neg MS Neg psych Neg neuro  Objective:   Vitals:   05/08/22 1554  BP: 115/82  Pulse: 87  SpO2: 99%  Weight: 158 lb (71.7 kg)  Height: 5\' 11"  (1.803 m)   Exam General appearance : Awake, alert, not in any distress. Speech Clear. Not toxic looking, thin HEENT: Atraumatic and Normocephalic, pupils equally reactive to light and accomodation Neck: Supple, no JVD. No cervical lymphadenopathy.  Chest: Good air entry bilaterally, CTAB.  No rales/rhonchi/wheezing CVS: S1 S2 regular, no murmurs. No gallop no rub Extremities: B/L Lower Ext shows no edema, both legs are warm to touch Neurology: Awake alert, and oriented X 3, CN II-XII intact, Non focal Skin: No Rash  Data Review Lab Results  Component Value Date   HGBA1C 7.6 (A) 03/21/2022   HGBA1C 8.7 (A) 11/15/2021   HGBA1C 7.7 (H) 04/19/2021    Assessment & Plan   1. Type 2 diabetes mellitus with diabetic polyneuropathy, with long-term current use of insulin (HCC) Not at goal-followed by endocrine and has continuous meter - Glucose (CBG)  2. Chronic systolic congestive heart failure ([place]) Followed by cardiology  3. Ischemic cardiomyopathy Has cath scheduled for 05/17/2022.  No CP/sob/dizziness/HA today  4. Encounter for examination following treatment at hospital Does not need RF    Return in about 3 months (around 08/07/2022) for PCP for chronic conditions.  The patient was given clear instructions to go to ER or return to medical center if symptoms don't improve, worsen or new problems develop. The patient verbalized understanding. The patient was told to call to get lab results if they haven't heard anything in the next week.      Freeman Caldron, PA-C [facility] and [facility] [place], [place]   05/08/2022, 4:59 PM

## 2022-05-08 NOTE — Progress Notes (Signed)
[facility] PREP Weekly Session  Patient Details  Name: Jason Stevenson MRN: [id number] Date of Birth: 1985/06/10 Age: 37 y.o. PCP: Ladell Pier, MD  Vitals:   05/06/22 1430  Weight: 160 lb 3.2 oz (72.7 kg)     [facility] Weekly seesion - 05/08/22 1200       [facility] "PREP" Location   [facility] "PREP" Location [facility]      Weekly Session   Topic Discussed Calorie breakdown    Minutes exercised this week 100 minutes    Classes attended to date 14             Barnett Hatter 05/08/2022, 12:28 PM

## 2022-05-09 ENCOUNTER — Other Ambulatory Visit (HOSPITAL_COMMUNITY): Payer: Self-pay

## 2022-05-09 NOTE — Telephone Encounter (Addendum)
Pharmacy Patient Advocate Encounter  Medicaid approved the PA I submitted, but did it for the 2 reader.   PA# [id number]  Effective dates: 05/02/22 through 10/29/22  Calling to follow up  Their system hasn't updated to cover the 3 yet. He will have to go back to the 2 for now. PA still on file for those sensors.

## 2022-05-09 NOTE — Telephone Encounter (Signed)
Pt advised via Mychart message. 

## 2022-05-14 ENCOUNTER — Encounter (HOSPITAL_COMMUNITY): Payer: Self-pay | Admitting: Cardiology

## 2022-05-16 ENCOUNTER — Telehealth (HOSPITAL_COMMUNITY): Payer: Self-pay

## 2022-05-16 NOTE — Telephone Encounter (Signed)
Left message on voicemail about procedure scheduled for tomorrow.Left call back number if patient has any questions

## 2022-05-16 NOTE — Progress Notes (Signed)
[facility] PREP Evaluation  Patient Details  Name: Jason Stevenson MRN: [id number] Date of Birth: 1985/07/02 Age: 37 y.o. PCP: Ladell Pier, MD  Vitals:   05/16/22 1153  BP: 116/67  Pulse: 89  Weight: 156 lb 12.8 oz (71.1 kg)     [facility] Eval - 05/16/22 1100       [facility] "PREP" Location   [facility] "PREP" Location [facility]      Referral    Referring Provider Aundra Dubin    Reason for referral Stroke;Heart Failure;Inactivity    Program Start Date --   end date 05/15/22     Measurement   Waist Circumference 31.5 inches    Hip Circumference 34.5 inches    Body fat --   not measured     Information for Trainer   Goals Plans on continuing to exercise      Mobility and Daily Activities   I find it easy to walk up or down two or more flights of stairs. 2    I have no trouble taking out the trash. 3    I do housework such as vacuuming and dusting on my own without difficulty. 3    I can easily lift a gallon of milk (8lbs). 4    I can easily walk a mile. 2    I have no trouble reaching into high cupboards or reaching down to pick up something from the floor. 3    I do not have trouble doing out-door work such as Armed forces logistics/support/administrative officer, raking leaves, or gardening. 2      Mobility and Daily Activities   I feel younger than my age. 2    I feel independent. 2    I feel energetic. 2    I live an active life.  2    I feel strong. 2    I feel healthy. 3    I feel active as other people my age. 1      How fit and strong are you.   Fit and Strong Total Score 33            Past Medical History:  Diagnosis Date   Chronic systolic CHF (congestive heart failure) ([place]) 06/18/2015   Hemorrhagic stroke ([place])    Hyperlipidemia    Hypertension    Immune deficiency disorder ([place])    Nonischemic cardiomyopathy ([place]) Noted as early as 2007   Per chart review (cards consult note 2011), EF of 40% in 2007, down to 20-25% in 2011   Past Surgical History:  Procedure Laterality Date   ICD  IMPLANT N/A 08/27/2021   Procedure: ICD IMPLANT;  Surgeon: Vickie Epley, MD;  Location: [place] [facility];  Service: Cardiovascular;  Laterality: N/A;   None     RIGHT HEART CATH  06/21/2021   [facility]   RIGHT/LEFT HEART CATH AND CORONARY ANGIOGRAPHY N/A 11/02/2020   Procedure: RIGHT/LEFT HEART CATH AND CORONARY ANGIOGRAPHY;  Surgeon: Larey Dresser, MD;  Location: [place] CV [facility];  Service: Cardiovascular;  Laterality: N/A;   Social History   Tobacco Use  Smoking Status Never  Smokeless Tobacco Never   Attended 17 sessions, 8 of 12 educational sessions Fit testing: Cardio march: 100 to 180 Sit to stand: 8 to 10 Bicep curl: 12 to 15 Balance improved.  Encouraged to continue exercising and eating healthy. Plans to join the [facility] to continue  [facility] 05/16/2022, 11:58 AM

## 2022-05-17 ENCOUNTER — Other Ambulatory Visit: Payer: Self-pay

## 2022-05-17 ENCOUNTER — Encounter (HOSPITAL_COMMUNITY): Payer: Self-pay | Admitting: Cardiology

## 2022-05-17 ENCOUNTER — Encounter (HOSPITAL_COMMUNITY)
Admission: RE | Disposition: A | Discharge status: Home / Self Care | Payer: Self-pay | Source: Ambulatory Visit | Attending: Cardiology

## 2022-05-17 ENCOUNTER — Ambulatory Visit (HOSPITAL_COMMUNITY)
Admission: RE | Admit: 2022-05-17 | Discharge: 2022-05-17 | Disposition: A | Discharge status: Home / Self Care | Payer: Medicaid Other | Source: Ambulatory Visit | Attending: Cardiology | Admitting: Cardiology

## 2022-05-17 DIAGNOSIS — Z8673 Personal history of transient ischemic attack (TIA), and cerebral infarction without residual deficits: Secondary | ICD-10-CM | POA: Insufficient documentation

## 2022-05-17 DIAGNOSIS — Z7984 Long term (current) use of oral hypoglycemic drugs: Secondary | ICD-10-CM | POA: Diagnosis not present

## 2022-05-17 DIAGNOSIS — Z794 Long term (current) use of insulin: Secondary | ICD-10-CM | POA: Diagnosis not present

## 2022-05-17 DIAGNOSIS — Z7901 Long term (current) use of anticoagulants: Secondary | ICD-10-CM | POA: Insufficient documentation

## 2022-05-17 DIAGNOSIS — Z95810 Presence of automatic (implantable) cardiac defibrillator: Secondary | ICD-10-CM | POA: Insufficient documentation

## 2022-05-17 DIAGNOSIS — I428 Other cardiomyopathies: Secondary | ICD-10-CM | POA: Diagnosis not present

## 2022-05-17 DIAGNOSIS — I110 Hypertensive heart disease with heart failure: Secondary | ICD-10-CM | POA: Insufficient documentation

## 2022-05-17 DIAGNOSIS — Z79899 Other long term (current) drug therapy: Secondary | ICD-10-CM | POA: Diagnosis not present

## 2022-05-17 DIAGNOSIS — G40909 Epilepsy, unspecified, not intractable, without status epilepticus: Secondary | ICD-10-CM | POA: Diagnosis not present

## 2022-05-17 DIAGNOSIS — I5022 Chronic systolic (congestive) heart failure: Secondary | ICD-10-CM | POA: Insufficient documentation

## 2022-05-17 DIAGNOSIS — E1042 Type 1 diabetes mellitus with diabetic polyneuropathy: Secondary | ICD-10-CM | POA: Insufficient documentation

## 2022-05-17 HISTORY — PX: RIGHT HEART CATH: CATH118263

## 2022-05-17 LAB — CBC
HCT: 38.8 % — ABNORMAL LOW (ref 39.0–52.0)
Hemoglobin: 12.5 g/dL — ABNORMAL LOW (ref 13.0–17.0)
MCH: 28.5 pg (ref 26.0–34.0)
MCHC: 32.2 g/dL (ref 30.0–36.0)
MCV: 88.6 fL (ref 80.0–100.0)
Platelets: 154 10*3/uL (ref 150–400)
RBC: 4.38 MIL/uL (ref 4.22–5.81)
RDW: 11.9 % (ref 11.5–15.5)
WBC: 4 10*3/uL (ref 4.0–10.5)
nRBC: 0 % (ref 0.0–0.2)

## 2022-05-17 LAB — POCT I-STAT EG7
Acid-Base Excess: 0 mmol/L (ref 0.0–2.0)
Acid-Base Excess: 1 mmol/L (ref 0.0–2.0)
Bicarbonate: 27.2 mmol/L (ref 20.0–28.0)
Bicarbonate: 27.4 mmol/L (ref 20.0–28.0)
Calcium, Ion: 1.21 mmol/L (ref 1.15–1.40)
Calcium, Ion: 1.26 mmol/L (ref 1.15–1.40)
HCT: 36 % — ABNORMAL LOW (ref 39.0–52.0)
HCT: 36 % — ABNORMAL LOW (ref 39.0–52.0)
Hemoglobin: 12.2 g/dL — ABNORMAL LOW (ref 13.0–17.0)
Hemoglobin: 12.2 g/dL — ABNORMAL LOW (ref 13.0–17.0)
O2 Saturation: 70 %
O2 Saturation: 73 %
Potassium: 3.8 mmol/L (ref 3.5–5.1)
Potassium: 3.9 mmol/L (ref 3.5–5.1)
Sodium: 143 mmol/L (ref 135–145)
Sodium: 144 mmol/L (ref 135–145)
TCO2: 29 mmol/L (ref 22–32)
TCO2: 29 mmol/L (ref 22–32)
pCO2, Ven: 52.3 mmHg (ref 44–60)
pCO2, Ven: 52.8 mmHg (ref 44–60)
pH, Ven: 7.323 (ref 7.25–7.43)
pH, Ven: 7.324 (ref 7.25–7.43)
pO2, Ven: 40 mmHg (ref 32–45)
pO2, Ven: 42 mmHg (ref 32–45)

## 2022-05-17 LAB — PROTIME-INR
INR: 2.6 — ABNORMAL HIGH (ref 0.8–1.2)
Prothrombin Time: 27.2 seconds — ABNORMAL HIGH (ref 11.4–15.2)

## 2022-05-17 LAB — GLUCOSE, CAPILLARY: Glucose-Capillary: 105 mg/dL — ABNORMAL HIGH (ref 70–99)

## 2022-05-17 SURGERY — RIGHT HEART CATH
Anesthesia: LOCAL

## 2022-05-17 MED ORDER — HYDRALAZINE HCL 20 MG/ML IJ SOLN: 10.0000 mg | INTRAMUSCULAR | Status: DC | PRN

## 2022-05-17 MED ORDER — HEPARIN (PORCINE) IN NACL 1000-0.9 UT/500ML-% IV SOLN
INTRAVENOUS | Status: AC
  Filled 2022-05-17: qty 500

## 2022-05-17 MED ORDER — LIDOCAINE HCL (PF) 1 % IJ SOLN
INTRAMUSCULAR | Status: DC | PRN
  Administered 2022-05-17: 5 mL

## 2022-05-17 MED ORDER — LABETALOL HCL 5 MG/ML IV SOLN: 10.0000 mg | INTRAVENOUS | Status: DC | PRN

## 2022-05-17 MED ORDER — LIDOCAINE HCL (PF) 1 % IJ SOLN
INTRAMUSCULAR | Status: AC
  Filled 2022-05-17: qty 30

## 2022-05-17 MED ORDER — SODIUM CHLORIDE 0.9% FLUSH: 3.0000 mL | INTRAVENOUS | Status: DC | PRN

## 2022-05-17 MED ORDER — ACETAMINOPHEN 325 MG PO TABS: 650.0000 mg | ORAL_TABLET | ORAL | Status: DC | PRN

## 2022-05-17 MED ORDER — SODIUM CHLORIDE 0.9% FLUSH: 3.0000 mL | Freq: Two times a day (BID) | INTRAVENOUS | Status: DC

## 2022-05-17 MED ORDER — SODIUM CHLORIDE 0.9 % IV SOLN: 250.0000 mL | INTRAVENOUS | Status: DC | PRN

## 2022-05-17 MED ORDER — SODIUM CHLORIDE 0.9 % IV SOLN: INTRAVENOUS | Status: DC

## 2022-05-17 MED ORDER — ONDANSETRON HCL 4 MG/2ML IJ SOLN: 4.0000 mg | Freq: Four times a day (QID) | INTRAMUSCULAR | Status: DC | PRN

## 2022-05-17 MED ORDER — HEPARIN (PORCINE) IN NACL 1000-0.9 UT/500ML-% IV SOLN
INTRAVENOUS | Status: DC | PRN
  Administered 2022-05-17: 500 mL

## 2022-05-17 SURGICAL SUPPLY — 6 items
CATH SWAN GANZ 7F STRAIGHT (CATHETERS) IMPLANT
GLIDESHEATH SLENDER 7FR .021G (SHEATH) IMPLANT
GUIDEWIRE .025 260CM (WIRE) IMPLANT
KIT HEART LEFT (KITS) ×1 IMPLANT
PACK CARDIAC CATHETERIZATION (CUSTOM PROCEDURE TRAY) ×1 IMPLANT
TRANSDUCER W/STOPCOCK (MISCELLANEOUS) ×1 IMPLANT

## 2022-05-17 NOTE — Interval H&P Note (Signed)
History and Physical Interval Note:  05/17/2022 7:40 AM  Jason Stevenson  has presented today for surgery, with the diagnosis of heart failure.  The various methods of treatment have been discussed with the patient and family. After consideration of risks, benefits and other options for treatment, the patient has consented to  Procedure(s): RIGHT HEART CATH (N/A) as a surgical intervention.  The patient's history has been reviewed, patient examined, no change in status, stable for surgery.  I have reviewed the patient's chart and labs.  Questions were answered to the patient's satisfaction.     [name] [employer]

## 2022-05-20 ENCOUNTER — Other Ambulatory Visit: Payer: Self-pay | Admitting: Internal Medicine

## 2022-05-20 DIAGNOSIS — E139 Other specified diabetes mellitus without complications: Secondary | ICD-10-CM

## 2022-05-22 ENCOUNTER — Other Ambulatory Visit (HOSPITAL_COMMUNITY): Payer: Self-pay | Admitting: Cardiology

## 2022-05-22 ENCOUNTER — Other Ambulatory Visit: Payer: Self-pay | Admitting: Internal Medicine

## 2022-05-22 DIAGNOSIS — Z5181 Encounter for therapeutic drug level monitoring: Secondary | ICD-10-CM

## 2022-05-22 NOTE — Telephone Encounter (Signed)
Warfarin 5mg  refill  Last INR Visit 05/06/22 Last OV 04/30/22

## 2022-05-27 ENCOUNTER — Ambulatory Visit (HOSPITAL_COMMUNITY): Payer: Medicaid Other

## 2022-05-27 ENCOUNTER — Ambulatory Visit: Payer: Medicaid Other | Attending: Cardiology

## 2022-05-27 DIAGNOSIS — I639 Cerebral infarction, unspecified: Secondary | ICD-10-CM | POA: Insufficient documentation

## 2022-05-27 DIAGNOSIS — Z7901 Long term (current) use of anticoagulants: Secondary | ICD-10-CM | POA: Diagnosis not present

## 2022-05-27 DIAGNOSIS — I513 Intracardiac thrombosis, not elsewhere classified: Secondary | ICD-10-CM | POA: Diagnosis not present

## 2022-05-27 DIAGNOSIS — Z5181 Encounter for therapeutic drug level monitoring: Secondary | ICD-10-CM | POA: Diagnosis not present

## 2022-05-27 DIAGNOSIS — I5022 Chronic systolic (congestive) heart failure: Secondary | ICD-10-CM | POA: Diagnosis not present

## 2022-05-27 LAB — POCT INR: INR: 1.2 — AB (ref 2.0–3.0)

## 2022-05-27 NOTE — Patient Instructions (Signed)
Description   Take 2 tablets today and 2 tablets tomorrow and then continue taking Warfarin 1.5 tablets daily except for 1 tablet on Mondays, Wednesday and Fridays.  Stay consistent with leafy veggies.  Recheck INR in 1 week.  Coumadin Clinic [phone]

## 2022-05-29 ENCOUNTER — Telehealth (HOSPITAL_COMMUNITY): Payer: Self-pay | Admitting: Licensed Clinical Social Worker

## 2022-05-29 ENCOUNTER — Encounter (HOSPITAL_COMMUNITY): Payer: Medicaid Other

## 2022-05-29 NOTE — Telephone Encounter (Signed)
H&V Care Navigation CSW Progress Note  Clinical Social Worker consulted to speak with pt about food insecurity concerns- unable to reach- left VM requesting return call.   SDOH Screenings   Food Insecurity: No Food Insecurity (09/14/2020)  Recent Concern: Food Insecurity - Food Insecurity Present (09/13/2020)  Housing: Medium Risk (01/28/2022)  Transportation Needs: Unmet Transportation Needs (12/15/2020)  Utilities: At Risk (12/18/2021)  Depression (PHQ2-9): Low Risk  (01/18/2022)  Recent Concern: Depression (PHQ2-9) - Medium Risk (11/13/2021)  Financial Resource Strain: High Risk (10/18/2021)  Tobacco Use: Low Risk  (05/17/2022)    Jorge Ny, LCSW Clinical Social Worker [facility] Desk#: [phone] Cell#: [phone]

## 2022-05-30 ENCOUNTER — Ambulatory Visit (INDEPENDENT_AMBULATORY_CARE_PROVIDER_SITE_OTHER): Payer: Medicaid Other

## 2022-05-30 DIAGNOSIS — I428 Other cardiomyopathies: Secondary | ICD-10-CM | POA: Diagnosis not present

## 2022-05-30 LAB — CUP PACEART REMOTE DEVICE CHECK
Battery Remaining Longevity: 129 mo
Battery Voltage: 3.03 V
Brady Statistic RV Percent Paced: 0 %
Date Time Interrogation Session: 20240215033623
HighPow Impedance: 59 Ohm
Implantable Lead Connection Status: 753985
Implantable Lead Implant Date: 20230515
Implantable Lead Location: 753860
Implantable Pulse Generator Implant Date: 20230515
Lead Channel Impedance Value: 380 Ohm
Lead Channel Impedance Value: 456 Ohm
Lead Channel Pacing Threshold Amplitude: 0.625 V
Lead Channel Pacing Threshold Pulse Width: 0.4 ms
Lead Channel Sensing Intrinsic Amplitude: 3.5 mV
Lead Channel Sensing Intrinsic Amplitude: 3.5 mV
Lead Channel Setting Pacing Amplitude: 1 V
Lead Channel Setting Pacing Pulse Width: 0.4 ms
Lead Channel Setting Sensing Sensitivity: 0.3 mV
Zone Setting Status: 755011
Zone Setting Status: 755011

## 2022-06-03 ENCOUNTER — Ambulatory Visit: Payer: Medicaid Other | Attending: Internal Medicine | Admitting: Internal Medicine

## 2022-06-03 ENCOUNTER — Ambulatory Visit: Payer: Medicaid Other | Attending: Cardiovascular Disease | Admitting: *Deleted

## 2022-06-03 ENCOUNTER — Encounter: Payer: Self-pay | Admitting: Internal Medicine

## 2022-06-03 VITALS — BP 130/82 | HR 69 | Ht 71.0 in | Wt 161.8 lb

## 2022-06-03 DIAGNOSIS — Z91148 Patient's other noncompliance with medication regimen for other reason: Secondary | ICD-10-CM | POA: Diagnosis not present

## 2022-06-03 DIAGNOSIS — F329 Major depressive disorder, single episode, unspecified: Secondary | ICD-10-CM | POA: Diagnosis not present

## 2022-06-03 DIAGNOSIS — I639 Cerebral infarction, unspecified: Secondary | ICD-10-CM

## 2022-06-03 DIAGNOSIS — G40909 Epilepsy, unspecified, not intractable, without status epilepticus: Secondary | ICD-10-CM | POA: Diagnosis not present

## 2022-06-03 DIAGNOSIS — I5022 Chronic systolic (congestive) heart failure: Secondary | ICD-10-CM | POA: Insufficient documentation

## 2022-06-03 DIAGNOSIS — R454 Irritability and anger: Secondary | ICD-10-CM

## 2022-06-03 DIAGNOSIS — Z8673 Personal history of transient ischemic attack (TIA), and cerebral infarction without residual deficits: Secondary | ICD-10-CM | POA: Diagnosis not present

## 2022-06-03 DIAGNOSIS — Z7901 Long term (current) use of anticoagulants: Secondary | ICD-10-CM

## 2022-06-03 DIAGNOSIS — Z79899 Other long term (current) drug therapy: Secondary | ICD-10-CM | POA: Diagnosis not present

## 2022-06-03 DIAGNOSIS — I513 Intracardiac thrombosis, not elsewhere classified: Secondary | ICD-10-CM

## 2022-06-03 DIAGNOSIS — I428 Other cardiomyopathies: Secondary | ICD-10-CM | POA: Diagnosis not present

## 2022-06-03 DIAGNOSIS — Z7682 Awaiting organ transplant status: Secondary | ICD-10-CM | POA: Diagnosis not present

## 2022-06-03 DIAGNOSIS — E782 Mixed hyperlipidemia: Secondary | ICD-10-CM | POA: Insufficient documentation

## 2022-06-03 DIAGNOSIS — Z5181 Encounter for therapeutic drug level monitoring: Secondary | ICD-10-CM

## 2022-06-03 DIAGNOSIS — E1140 Type 2 diabetes mellitus with diabetic neuropathy, unspecified: Secondary | ICD-10-CM | POA: Diagnosis not present

## 2022-06-03 DIAGNOSIS — E1142 Type 2 diabetes mellitus with diabetic polyneuropathy: Secondary | ICD-10-CM

## 2022-06-03 DIAGNOSIS — I110 Hypertensive heart disease with heart failure: Secondary | ICD-10-CM | POA: Diagnosis not present

## 2022-06-03 DIAGNOSIS — F320 Major depressive disorder, single episode, mild: Secondary | ICD-10-CM | POA: Diagnosis not present

## 2022-06-03 DIAGNOSIS — Z794 Long term (current) use of insulin: Secondary | ICD-10-CM

## 2022-06-03 LAB — GLUCOSE, POCT (MANUAL RESULT ENTRY): POC Glucose: 106 mg/dl — AB (ref 70–99)

## 2022-06-03 LAB — POCT INR: INR: 2.8 (ref 2.0–3.0)

## 2022-06-03 NOTE — Patient Instructions (Signed)
Continue warfarin 1.5 tablets daily except for 1 tablet on Mondays, Wednesday and Fridays.  Stay consistent with leafy veggies.  Recheck INR in 3 week.  Coumadin Clinic [phone]

## 2022-06-03 NOTE — Progress Notes (Signed)
Patient ID: Jason Stevenson, male    DOB: [birth year]  MRN: [id number]  CC: Nausea   Subjective: Jason Stevenson is a 37 y.o. male who presents for chronic disease management His concerns today include:  Patient with history of DM with painful neuropathy, HTN, HL, NICM with systolic CHF EF [id number], ICD ( Cardiologist Dr. 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 Had recent testing by cardiology including echo revealed HF remains less <20% -LV thrombus resolved on echo done 04/2022 -Lasix and K+ supplement changed to 2 days a wk and Entresto increased to 97/103 BID.  Other medications include spironolactone 25 mg daily, Farxiga 5 mg daily, Toprol XL 100 mg daily, ivabradine 5 mg twice a day, digoxin 0.125 mg daily Compliant with meds and salt restriction Just completed Prep Exercise program at [facility].  He was going 2x/wk.  Plans to try to continue the program.  No chest pains or shortness of breath at this time.  No lower extremity edema. No bruising or bleeding on Coumdin.  Last INR 1.2.  Has appt today for recheck He remains on atorvastatin 80 mg daily and Zetia 10 mg daily for cholesterol.  DM:  Lab Results  Component Value Date   HGBA1C 7.6 (A) 03/21/2022   Results for orders placed or performed in visit on 06/03/22  POCT glucose (manual entry)  Result Value Ref Range   POC Glucose 106 (A) 70 - 99 mg/dl  Saw his endocrinologist 03/2022.  A1C was 7.6. Remains on Metformin 500 mg BID, Tresiba 20 units and Fiasp 5-10 units before meals.  Doing better with taking the Fiasp before meals.  His CGM not working well with his phone.  Does not have wife's phone with him. Reports he is eating more sweets in an attempt to gain weight.  Seizure disorder: Stable on Keppra 1.5 g twice a day, Trileptal 600 mg  twice a day and Dilantin 100 mg 3 times a day.  No recent seizures but had an episode where he had a "pause" for a moment 1 wk ago.    Pos Dep screen:  some marital stress and yesterday was his sister's birthday.  She was murdered last yr.  Feels he is doing okay on Cymbalta and has support from church members and family.  Denies SI/HI Declined doing PHQ9 today Would like to go to anger management class.  States that his wife and others have told him that he has an anger issue. Patient Active Problem List   Diagnosis Date Noted   Mild major depression ([name]) 06/03/2022   Nonischemic cardiomyopathy ([place]) 08/27/2021   LADA (latent autoimmune diabetes in adults), managed as type 2 ([name]) 03/28/2021   Poorly controlled type 2 diabetes mellitus with circulatory disorder ([name]) 03/19/2021   Ischemic cardiomyopathy    Syncope 12/14/2020   Chest pain 10/30/2020   History of cerebrovascular accident (CVA) with residual deficit 10/27/2020   Acute CVA (cerebrovascular accident) ([name]) 09/21/2020   Near syncope 09/21/2020   Encounter for monitoring Coumadin therapy 09/20/2020   LV (left ventricular) mural thrombus 09/13/2020   Seizure ([name]) 09/13/2020   Neurological deficit present 09/12/2020   History of COVID-19 09/12/2020   Intracerebral hemorrhage 09/12/2020   Acute cerebrovascular accident (CVA) ([name]) 09/12/2020   COVID-19 08/23/2020   Noncompliance with medication treatment due to intermittent use of medication  10/12/2018   Dyslipidemia [id number]   Chronic systolic CHF (congestive heart failure) ([name]) 06/18/2015   Needs flu shot 12/23/2013   Non-ischemic cardiomyopathy ([name]) 11/06/2012   HTN (hypertension) 11/06/2012   HLD (hyperlipidemia) 11/06/2012     Current Outpatient Medications on File Prior to Visit  Medication Sig Dispense Refill   acetaminophen (TYLENOL) 325 MG tablet Take 1-2 tablets (325-650 mg total) by mouth every 4 (four) hours as needed for mild pain.     atorvastatin  (LIPITOR) 80 MG tablet TAKE 1 TABLET (80 MG TOTAL) BY MOUTH DAILY (EVENING) 90 tablet 1   Blood Pressure Monitor DEVI Use as directed to check home blood pressure 2-3 times a week 1 Device 0   Continuous Blood Gluc Sensor (FREESTYLE LIBRE 2 SENSOR) MISC USE EVERY 14 (FOURTEEN) DAYS. 6 each 1   CORLANOR 5 MG TABS tablet TAKE 1 TABLET (5 MG TOTAL) BY MOUTH 2 (TWO) TIMES DAILY WITH A MEAL (AM+EVENING) 60 tablet 6   dapagliflozin propanediol (FARXIGA) 5 MG TABS tablet TAKE 1 TABLET (5 MG TOTAL) BY MOUTH DAILY BEFORE BREAKFAST. 30 tablet 11   dicyclomine (BENTYL) 20 MG tablet Take 1 tablet (20 mg total) by mouth 2 (two) times daily. 20 tablet 0   digoxin (LANOXIN) 0.125 MG tablet Take 1 tablet (0.125 mg total) by mouth daily. Please request Cardiologist for future refills. 30 tablet 6   DULoxetine (CYMBALTA) 30 MG capsule Take 1 capsule (30 mg total) by mouth daily. 30 capsule 5   Evolocumab (REPATHA SURECLICK) [id number] MG/ML SOAJ Inject 140 mg into the skin every 14 (fourteen) days. 2 mL 11   ezetimibe (ZETIA) 10 MG tablet TAKE 1 TABLET (10 MG TOTAL) BY MOUTH DAILY (AM) 30 tablet 3   furosemide (LASIX) 20 MG tablet Take 1 tablet (20 mg total) by mouth 2 (two) times a week. Mondays Wednesdays and Friday (Patient taking differently: Take 20 mg by mouth 2 (two) times a week. Monday and Friday) 30 tablet 11   glucose blood (ACCU-CHEK GUIDE) test strip Use to check blood sugar three times daily. 100 each 2   insulin aspart (FIASP FLEXTOUCH) 100 UNIT/ML FlexTouch Pen Inject 5-8 Units into the skin 3 (three) times daily before meals. (Patient taking differently: Inject 5-10 Units into the skin 3 (three) times daily before meals. Sliding scale) 30 mL 3   Insulin Pen Needle 32G X 4 MM MISC Use to inject insulin. 200 each 2   Insulin Syringe-Needle U-100 (RELION INSULIN SYRINGE) 31G X 15/64" 0.3 ML MISC Use to inject insulin daily. 100 each 11   levETIRAcetam (KEPPRA) 750 MG tablet Take 2 tablets (1,500 mg total) by  mouth 2 (two) times daily. 360 tablet 4   loperamide (IMODIUM) 2 MG capsule Take 1 capsule (2 mg total) by mouth 4 (four) times daily as needed for diarrhea or loose stools. 12 capsule 0   Menthol, Topical Analgesic, (ICY HOT BACK EX) Apply 1 application  topically daily as needed (neuropathy). Icy hot cream     metFORMIN (GLUCOPHAGE-XR) 500 MG 24 hr tablet Take 1 tablet (500 mg total) by mouth 2 (two) times daily with a meal. 180 tablet 3   metoprolol (TOPROL-XL) 200 MG 24 hr tablet TAKE 0.5 TABLETS (100 MG TOTAL) BY MOUTH DAILY. TAKE WITH OR IMMEDIATELY FOLLOWING A MEAL (EVENING) 15 tablet 6   Oxcarbazepine (TRILEPTAL) 300 MG tablet Take 2 tablets (600 mg total) by mouth 2 (two) times daily. 360 tablet 4   phenytoin (DILANTIN) 100 MG ER  capsule Take 1 capsule (100 mg total) by mouth 3 (three) times daily. 270 capsule 4   potassium chloride SA (KLOR-CON M) 20 MEQ tablet Take 1 tablet (20 mEq total) by mouth 2 (two) times a week. Take every Mon, Wed and Fri with Lasix (Patient taking differently: Take 20 mEq by mouth 2 (two) times a week. Take every Mon,and Fri with Lasix) 30 tablet 4   pregabalin (LYRICA) 300 MG capsule Take 1 capsule (300 mg total) by mouth at bedtime. 30 capsule 5   sacubitril-valsartan (ENTRESTO) 97-103 MG Take 1 tablet by mouth 2 (two) times daily. 60 tablet 11   sildenafil (VIAGRA) 50 MG tablet 1 tab p.o. half hour prior to intercourse PRN.  Limit use to 1 tab/24 hr 10 tablet 6   spironolactone (ALDACTONE) 25 MG tablet TAKE 1 TABLET (25 MG TOTAL) BY MOUTH AT BEDTIME. 90 tablet 1   TRESIBA FLEXTOUCH 200 UNIT/ML FlexTouch Pen INJECT 20 UNITS INTO THE SKIN DAILY. 18 mL 3   warfarin (COUMADIN) 5 MG tablet TAKE 1 TO 1 & 1/2 TABLETS (5 TO 7.5 MG TOTAL) BY MOUTH DAILY. 40 tablet 3   No current facility-administered medications on file prior to visit.    No Known Allergies  Social History   Socioeconomic History   Marital status: Married    Spouse name: [name]   Number of  children: 0   Years of education: 13   Highest education level: Some college, no degree  Occupational History   Occupation: unemployed  Tobacco Use   Smoking status: Never   Smokeless tobacco: Never  Vaping Use   Vaping Use: Not on file  Substance and Sexual Activity   Alcohol use: Not Currently    Comment: "occasional" when "hanging out with the wrong people" No recent use.   Drug use: Not Currently    Types: Marijuana    Comment: occasional, last 2013   Sexual activity: Not Currently  Other Topics Concern   Not on file  Social History Narrative   Pt lives at home with his wife    Right handed    Caffeine- hardly any   Social Determinants of Health   Financial Resource Strain: High Risk (10/18/2021)   Overall Financial Resource Strain (CARDIA)    Difficulty of Paying Living Expenses: Hard  Food Insecurity: No Food Insecurity (09/14/2020)   Hunger Vital Sign    Worried About Running Out of Food in the Last Year: Never true    Ran Out of Food in the Last Year: Never true  Recent Concern: Food Insecurity - Food Insecurity Present (09/13/2020)   Hunger Vital Sign    Worried About Running Out of Food in the Last Year: Never true    Ran Out of Food in the Last Year: Sometimes true  Transportation Needs: Unmet Transportation Needs (12/15/2020)   PRAPARE - Hydrologist (Medical): No    Lack of Transportation (Non-Medical): Yes  Physical Activity: Not on file  Stress: Not on file  Social Connections: Not on file  Intimate Partner Violence: Not on file    Family History  Problem Relation Age of Onset   Stroke Mother    Diabetes Mother    Hypertension Mother    Stroke Maternal Aunt    Heart attack Neg Hx     Past Surgical History:  Procedure Laterality Date   ICD IMPLANT N/A 08/27/2021   Procedure: ICD IMPLANT;  Surgeon: Vickie Epley, MD;  Location: [facility]  INVASIVE CV LAB;  Service: Cardiovascular;  Laterality: N/A;   None     RIGHT HEART CATH   06/21/2021   [facility]   RIGHT HEART CATH N/A 05/17/2022   Procedure: RIGHT HEART CATH;  Surgeon: Larey Dresser, MD;  Location: [place] CV LAB;  Service: Cardiovascular;  Laterality: N/A;   RIGHT/LEFT HEART CATH AND CORONARY ANGIOGRAPHY N/A 11/02/2020   Procedure: RIGHT/LEFT HEART CATH AND CORONARY ANGIOGRAPHY;  Surgeon: Larey Dresser, MD;  Location: [facility] CV LAB;  Service: Cardiovascular;  Laterality: N/A;    ROS: Review of Systems Negative except as stated above  PHYSICAL EXAM: BP 130/82 (BP Location: Left Arm, Patient Position: Sitting, Cuff Size: Normal)   Pulse 69   Ht 5' 11"$  (1.803 m)   Wt 161 lb 12.8 oz (73.4 kg)   SpO2 99%   BMI 22.57 kg/m   Wt Readings from Last 3 Encounters:  06/03/22 161 lb 12.8 oz (73.4 kg)  05/17/22 156 lb (70.8 kg)  05/16/22 156 lb 12.8 oz (71.1 kg)    Physical Exam   General appearance - alert, thin young African-American male who appears chronically ill and in no distress Mental status - normal mood, behavior, speech, dress, motor activity, and thought processes Neck - supple, no significant adenopathy Chest - clear to auscultation, no wheezes, rales or rhonchi, symmetric air entry Heart - normal rate, regular rhythm, normal S1, S2, no murmurs, rubs, clicks or gallops Extremities - peripheral pulses normal, no pedal edema, no clubbing or cyanosis    01/18/2022    7:37 AM 11/13/2021   10:51 AM 04/20/2021   10:45 AM  Depression screen PHQ 2/9  Decreased Interest 0 0 1  Down, Depressed, Hopeless 1 1 1  $ PHQ - 2 Score 1 1 2  $ Altered sleeping 0 1 3  Tired, decreased energy 0 1 2  Change in appetite 0 1 2  Feeling bad or failure about yourself  1 0 0  Trouble concentrating 1 1 0  Moving slowly or fidgety/restless 1 1 0  Suicidal thoughts 0 1 0  PHQ-9 Score 4 7 9  $ Difficult doing work/chores  Somewhat difficult        Latest Ref Rng & Units 05/17/2022    7:56 AM 04/30/2022   11:06 AM 04/11/2022    5:17 PM  CMP  Glucose 70  - 99 mg/dL  84  205   BUN 6 - 20 mg/dL  14  10   Creatinine 0.61 - 1.24 mg/dL  0.78  0.94   Sodium 135 - 145 mmol/L 135 - 145 mmol/L 143    144  140  146   Potassium 3.5 - 5.1 mmol/L 3.5 - 5.1 mmol/L 3.9    3.8  4.1  3.8   Chloride 98 - 111 mmol/L  105  106   CO2 22 - 32 mmol/L  28  28   Calcium 8.9 - 10.3 mg/dL  8.7  9.1    Lipid Panel     Component Value Date/Time   CHOL 175 04/30/2022 1106   CHOL 107 06/08/2021 0851   TRIG 82 04/30/2022 1106   HDL 61 04/30/2022 1106   HDL 49 06/08/2021 0851   CHOLHDL 2.9 04/30/2022 1106   VLDL 16 04/30/2022 1106   LDLCALC 98 04/30/2022 1106   LDLCALC 31 06/08/2021 0851    CBC    Component Value Date/Time   WBC 4.0 05/17/2022 0630   RBC 4.38 05/17/2022 0630   HGB 12.2 (  L) 05/17/2022 0756   HGB 12.2 (L) 05/17/2022 0756   HGB 13.7 08/03/2021 1313   HCT 36.0 (L) 05/17/2022 0756   HCT 36.0 (L) 05/17/2022 0756   HCT 40.0 08/03/2021 1313   PLT 154 05/17/2022 0630   PLT 275 08/03/2021 1313   MCV 88.6 05/17/2022 0630   MCV 83 08/03/2021 1313   MCH 28.5 05/17/2022 0630   MCHC 32.2 05/17/2022 0630   RDW 11.9 05/17/2022 0630   RDW 11.8 08/03/2021 1313   LYMPHSABS 1.6 11/21/2021 0857   LYMPHSABS 2.0 08/03/2021 1313   MONOABS 0.2 11/21/2021 0857   EOSABS 0.1 11/21/2021 0857   EOSABS 0.1 08/03/2021 1313   BASOSABS 0.0 11/21/2021 0857   BASOSABS 0.1 08/03/2021 1313    ASSESSMENT AND PLAN: 1. Type 2 diabetes mellitus with diabetic polyneuropathy, with long-term current use of insulin (HCC) Last A1c was not at goal. Needs to work on getting a new phone so that he can use his continuous glucose monitor properly. Strongly advised that he cuts back on eating sugary foods as a means of gaining weight.  Recommend increase protein in the diet instead. Continue current dose of metformin, Farxiga, Tresiba and aspart insulin as listed above - POCT glucose (manual entry) - Microalbumin / creatinine urine ratio  2. Seizure disorder (HCC) Stable  on Keppra, Trileptal and Dilantin  3. Chronic systolic congestive heart failure (HCC) Stable on guideline directed therapies including metoprolol, spironolactone, Entresto and Farxiga  4. Non-ischemic cardiomyopathy ([place]) See #3 above  5. Mild major depression ([place]) Doing okay on Cymbalta.  Declines doing PHQ-9 screen today.  6. Mixed hyperlipidemia Continue atorvastatin 80 mg daily, Zetia and Repatha.  Followed by cardiology.  7. Difficulty controlling anger Message sent to referral coordinator to try get him in with behavioral health. - Ambulatory referral to Psychiatry     Patient was given the opportunity to ask questions.  Patient verbalized understanding of the plan and was able to repeat key elements of the plan.   This documentation was completed using Radio producer.  Any transcriptional errors are unintentional.  Orders Placed This Encounter  Procedures   POCT glucose (manual entry)     Requested Prescriptions    No prescriptions requested or ordered in this encounter    No follow-ups on file.  Karle Plumber, MD, FACP

## 2022-06-03 NOTE — Patient Instructions (Signed)

## 2022-06-04 ENCOUNTER — Other Ambulatory Visit: Payer: Self-pay

## 2022-06-04 ENCOUNTER — Encounter (HOSPITAL_COMMUNITY): Payer: Self-pay | Admitting: Emergency Medicine

## 2022-06-04 ENCOUNTER — Encounter (HOSPITAL_COMMUNITY): Payer: Self-pay

## 2022-06-04 ENCOUNTER — Emergency Department (HOSPITAL_COMMUNITY)
Admission: EM | Admit: 2022-06-04 | Discharge: 2022-06-05 | Disposition: A | Discharge status: Home / Self Care | Payer: Medicaid Other | Attending: Emergency Medicine | Admitting: Emergency Medicine

## 2022-06-04 DIAGNOSIS — I509 Heart failure, unspecified: Secondary | ICD-10-CM | POA: Insufficient documentation

## 2022-06-04 DIAGNOSIS — Z794 Long term (current) use of insulin: Secondary | ICD-10-CM | POA: Insufficient documentation

## 2022-06-04 DIAGNOSIS — X58XXXA Exposure to other specified factors, initial encounter: Secondary | ICD-10-CM | POA: Diagnosis not present

## 2022-06-04 DIAGNOSIS — K0889 Other specified disorders of teeth and supporting structures: Secondary | ICD-10-CM | POA: Diagnosis present

## 2022-06-04 DIAGNOSIS — Z79899 Other long term (current) drug therapy: Secondary | ICD-10-CM | POA: Insufficient documentation

## 2022-06-04 DIAGNOSIS — I110 Hypertensive heart disease with heart failure: Secondary | ICD-10-CM | POA: Diagnosis not present

## 2022-06-04 DIAGNOSIS — Z8673 Personal history of transient ischemic attack (TIA), and cerebral infarction without residual deficits: Secondary | ICD-10-CM | POA: Diagnosis not present

## 2022-06-04 DIAGNOSIS — S00502A Unspecified superficial injury of oral cavity, initial encounter: Secondary | ICD-10-CM | POA: Diagnosis not present

## 2022-06-04 DIAGNOSIS — S0993XA Unspecified injury of face, initial encounter: Secondary | ICD-10-CM

## 2022-06-04 DIAGNOSIS — Z0271 Encounter for disability determination: Secondary | ICD-10-CM

## 2022-06-04 LAB — CBC WITH DIFFERENTIAL/PLATELET
Abs Immature Granulocytes: 0.02 10*3/uL (ref 0.00–0.07)
Basophils Absolute: 0.1 10*3/uL (ref 0.0–0.1)
Basophils Relative: 1 %
Eosinophils Absolute: 0.1 10*3/uL (ref 0.0–0.5)
Eosinophils Relative: 1 %
HCT: 41.7 % (ref 39.0–52.0)
Hemoglobin: 13.9 g/dL (ref 13.0–17.0)
Immature Granulocytes: 0 %
Lymphocytes Relative: 32 %
Lymphs Abs: 2.2 10*3/uL (ref 0.7–4.0)
MCH: 29.6 pg (ref 26.0–34.0)
MCHC: 33.3 g/dL (ref 30.0–36.0)
MCV: 88.9 fL (ref 80.0–100.0)
Monocytes Absolute: 0.4 10*3/uL (ref 0.1–1.0)
Monocytes Relative: 6 %
Neutro Abs: 4.2 10*3/uL (ref 1.7–7.7)
Neutrophils Relative %: 60 %
Platelets: 216 10*3/uL (ref 150–400)
RBC: 4.69 MIL/uL (ref 4.22–5.81)
RDW: 11.9 % (ref 11.5–15.5)
WBC: 7 10*3/uL (ref 4.0–10.5)
nRBC: 0 % (ref 0.0–0.2)

## 2022-06-04 LAB — BASIC METABOLIC PANEL
Anion gap: 10 (ref 5–15)
BUN: 14 mg/dL (ref 6–20)
CO2: 28 mmol/L (ref 22–32)
Calcium: 9 mg/dL (ref 8.9–10.3)
Chloride: 102 mmol/L (ref 98–111)
Creatinine, Ser: 0.86 mg/dL (ref 0.61–1.24)
GFR, Estimated: >60 mL/min (ref >60)
Glucose, Bld: 102 mg/dL — ABNORMAL HIGH (ref 70–99)
Potassium: 4.1 mmol/L (ref 3.5–5.1)
Sodium: 140 mmol/L (ref 135–145)

## 2022-06-04 LAB — MICROALBUMIN / CREATININE URINE RATIO
Creatinine, Urine: 80 mg/dL
Microalb/Creat Ratio: 7 mg/g creat (ref 0–29)
Microalbumin, Urine: 5.4 ug/mL

## 2022-06-04 LAB — PROTIME-INR
INR: 2.5 — ABNORMAL HIGH (ref 0.8–1.2)
Prothrombin Time: 26.9 seconds — ABNORMAL HIGH (ref 11.4–15.2)

## 2022-06-04 LAB — CBG MONITORING, ED: Glucose-Capillary: 91 mg/dL (ref 70–99)

## 2022-06-04 NOTE — ED Triage Notes (Addendum)
Pt states he had tooth pulled in lower left jaw at approx 4pm today and it has been bleeding since. Pt is on blood thinners.

## 2022-06-04 NOTE — ED Provider Triage Note (Signed)
Emergency Medicine Provider Triage Evaluation Note  Jason Stevenson , a 37 y.o. male  was evaluated in triage.  Pt complains of dental bleeding, states that he had a tooth extracted earlier today, his left bottom jaw, currently on warfarin, states that he has been continued bleeding from his extraction site, he states that he has not been able to eat or drink, he is concerned that his blood sugar might be low, he is endorsing chest pain shortness of breath general body aches.  Patient states that he did take his warfarin earlier today.  Review of Systems  Positive: Generalized weakness, bleeding Negative: Nausea vomiting  Physical Exam  BP (!) 146/110   Pulse 92   Temp 98.1 F (36.7 C) (Oral)   Resp 20   SpO2 97%  Gen:   Awake, no distress   Resp:  Normal effort  MSK:   Moves extremities without difficulty  Other:    Medical Decision Making  Medically screening exam initiated at 10:45 PM.  Appropriate orders placed.  Jason Stevenson was informed that the remainder of the evaluation will be completed by another provider, this initial triage assessment does not replace that evaluation, and the importance of remaining in the ED until their evaluation is complete.  Lab work has  been ordered, patient was given a teabag to help with the bleeding.  Will continue to monitor.   Marcello Fennel, PA-C 06/04/22 2247

## 2022-06-05 ENCOUNTER — Telehealth (HOSPITAL_COMMUNITY): Payer: Self-pay

## 2022-06-05 ENCOUNTER — Telehealth: Payer: Self-pay | Admitting: Internal Medicine

## 2022-06-05 ENCOUNTER — Telehealth (HOSPITAL_COMMUNITY): Payer: Self-pay | Admitting: Licensed Clinical Social Worker

## 2022-06-05 MED ORDER — AMINOCAPROIC ACID SOLUTION 5% (50 MG/ML)
10.0000 mL | ORAL | Status: DC
  Administered 2022-06-05 (×4): 10 mL via ORAL
  Filled 2022-06-05: qty 100

## 2022-06-05 MED ORDER — LIDOCAINE-EPINEPHRINE (PF) 2 %-1:200000 IJ SOLN
10.0000 mL | Freq: Once | INTRAMUSCULAR | Status: AC
  Administered 2022-06-05: 10 mL
  Filled 2022-06-05: qty 20

## 2022-06-05 NOTE — Telephone Encounter (Signed)
Received a dental clearance form request from [facility] on Wednesday 06/05/2022, form has been placed in Dr. Oleh Genin folder for review.

## 2022-06-05 NOTE — ED Provider Notes (Signed)
[facility] Provider Note   CSN: [id number] Arrival date & time: 06/04/22  2144     History  Chief Complaint  Patient presents with   Dental Problem    Jason Stevenson is a 37 y.o. male.  HPI   Patient with medical history including hypertension, hyperlipidemia, hemorrhagic stroke, cardiomyopathy, CHF, on anticoag's presents with complaints of dental bleeding.  Patient states that yesterday he got a tooth extracted, states that he was sent home, he states that he was having worsening pain in that tooth and had continued bleeding, he states he tried to pack without much relief.  He denies any chest pain shortness of breath near syncope, states that he has been compliant with his warfarin has not missed any dosages, he is not having any other complaints.    Home Medications Prior to Admission medications   Medication Sig Start Date End Date Taking? Authorizing Provider  acetaminophen (TYLENOL) 325 MG tablet Take 1-2 tablets (325-650 mg total) by mouth every 4 (four) hours as needed for mild pain. 08/28/21   Shirley Friar, PA-C  atorvastatin (LIPITOR) 80 MG tablet TAKE 1 TABLET (80 MG TOTAL) BY MOUTH DAILY (EVENING) 05/22/22   Ladell Pier, MD  Blood Pressure Monitor DEVI Use as directed to check home blood pressure 2-3 times a week 10/12/18   Ladell Pier, MD  Continuous Blood Gluc Sensor (FREESTYLE LIBRE 2 SENSOR) MISC USE EVERY 14 (FOURTEEN) DAYS. 05/20/22   Philemon Kingdom, MD  CORLANOR 5 MG TABS tablet TAKE 1 TABLET (5 MG TOTAL) BY MOUTH 2 (TWO) TIMES DAILY WITH A MEAL (AM+EVENING) 02/25/22   [place], Maricela Bo, FNP  dapagliflozin propanediol (FARXIGA) 5 MG TABS tablet TAKE 1 TABLET (5 MG TOTAL) BY MOUTH DAILY BEFORE BREAKFAST. 04/25/22   Larey Dresser, MD  dicyclomine (BENTYL) 20 MG tablet Take 1 tablet (20 mg total) by mouth 2 (two) times daily. 03/19/22   Henderly, Britni A, PA-C  digoxin (LANOXIN) 0.125 MG tablet  Take 1 tablet (0.125 mg total) by mouth daily. Please request Cardiologist for future refills. 12/14/21   Larey Dresser, MD  DULoxetine (CYMBALTA) 30 MG capsule Take 1 capsule (30 mg total) by mouth daily. 02/28/22   Ladell Pier, MD  Evolocumab (REPATHA SURECLICK) [id number] MG/ML SOAJ Inject 140 mg into the skin every 14 (fourteen) days. 11/16/21   Larey Dresser, MD  ezetimibe (ZETIA) 10 MG tablet TAKE 1 TABLET (10 MG TOTAL) BY MOUTH DAILY (AM) 03/27/22   Larey Dresser, MD  furosemide (LASIX) 20 MG tablet Take 1 tablet (20 mg total) by mouth 2 (two) times a week. Mondays Wednesdays and Friday Patient taking differently: Take 20 mg by mouth 2 (two) times a week. Monday and Friday 05/02/22   Larey Dresser, MD  glucose blood (ACCU-CHEK GUIDE) test strip Use to check blood sugar three times daily. 04/05/21   Ladell Pier, MD  insulin aspart (FIASP FLEXTOUCH) 100 UNIT/ML FlexTouch Pen Inject 5-8 Units into the skin 3 (three) times daily before meals. Patient taking differently: Inject 5-10 Units into the skin 3 (three) times daily before meals. Sliding scale 04/19/21   Philemon Kingdom, MD  Insulin Pen Needle 32G X 4 MM MISC Use to inject insulin. 04/05/21   Ladell Pier, MD  Insulin Syringe-Needle U-100 (RELION INSULIN SYRINGE) 31G X 15/64" 0.3 ML MISC Use to inject insulin daily. 07/13/18   Ladell Pier, MD  levETIRAcetam (KEPPRA) 750 MG  tablet Take 2 tablets (1,500 mg total) by mouth 2 (two) times daily. 06/26/21 09/19/22  Penumalli, Earlean Polka, MD  loperamide (IMODIUM) 2 MG capsule Take 1 capsule (2 mg total) by mouth 4 (four) times daily as needed for diarrhea or loose stools. 03/19/22   Henderly, Britni A, PA-C  Menthol, Topical Analgesic, (ICY HOT BACK EX) Apply 1 application  topically daily as needed (neuropathy). Icy hot cream    [provider]  metFORMIN (GLUCOPHAGE-XR) 500 MG 24 hr tablet Take 1 tablet (500 mg total) by mouth 2 (two) times daily with a meal. 07/17/21    Philemon Kingdom, MD  metoprolol (TOPROL-XL) 200 MG 24 hr tablet TAKE 0.5 TABLETS (100 MG TOTAL) BY MOUTH DAILY. TAKE WITH OR IMMEDIATELY FOLLOWING A MEAL (EVENING) 12/14/21   Larey Dresser, MD  Oxcarbazepine (TRILEPTAL) 300 MG tablet Take 2 tablets (600 mg total) by mouth 2 (two) times daily. 06/26/21 09/19/22  Penumalli, Earlean Polka, MD  phenytoin (DILANTIN) 100 MG ER capsule Take 1 capsule (100 mg total) by mouth 3 (three) times daily. 06/26/21 09/19/22  Penumalli, Earlean Polka, MD  potassium chloride SA (KLOR-CON M) 20 MEQ tablet Take 1 tablet (20 mEq total) by mouth 2 (two) times a week. Take every Mon, Wed and Fri with Lasix Patient taking differently: Take 20 mEq by mouth 2 (two) times a week. Take every Mon,and Fri with Lasix 05/02/22   Larey Dresser, MD  pregabalin (LYRICA) 300 MG capsule Take 1 capsule (300 mg total) by mouth at bedtime. 02/28/22   Ladell Pier, MD  sacubitril-valsartan (ENTRESTO) 97-103 MG Take 1 tablet by mouth 2 (two) times daily. 04/30/22   Larey Dresser, MD  sildenafil (VIAGRA) 50 MG tablet 1 tab p.o. half hour prior to intercourse PRN.  Limit use to 1 tab/24 hr 03/19/22   Ladell Pier, MD  spironolactone (ALDACTONE) 25 MG tablet TAKE 1 TABLET (25 MG TOTAL) BY MOUTH AT BEDTIME. 03/27/22   Ladell Pier, MD  TRESIBA FLEXTOUCH 200 UNIT/ML FlexTouch Pen INJECT 20 UNITS INTO THE SKIN DAILY. 01/29/22   Philemon Kingdom, MD  warfarin (COUMADIN) 5 MG tablet TAKE 1 TO 1 & 1/2 TABLETS (5 TO 7.5 MG TOTAL) BY MOUTH DAILY. 05/22/22   Larey Dresser, MD      Allergies    Patient has no known allergies.    Review of Systems   Review of Systems  Constitutional:  Negative for chills and fever.  HENT:  Positive for dental problem.   Respiratory:  Negative for shortness of breath.   Cardiovascular:  Negative for chest pain.  Gastrointestinal:  Negative for abdominal pain.  Neurological:  Negative for headaches.    Physical Exam Updated Vital Signs BP (!) 138/95    Pulse 74   Temp (!) 97.5 F (36.4 C) (Oral)   Resp 16   SpO2 96%  Physical Exam Vitals and nursing note reviewed.  Constitutional:      General: He is not in acute distress.    Appearance: Normal appearance. He is not ill-appearing or diaphoretic.  HENT:     Head: Normocephalic and atraumatic.     Nose: No congestion or rhinorrhea.     Mouth/Throat:     Mouth: Mucous membranes are moist.     Pharynx: Oropharynx is clear. No oropharyngeal exudate or posterior oropharyngeal erythema.     Comments: No trismus no torticollis no oral edema present, patient has a bleeding noted at the left bottom molar, no  evidence of infection present, tongue uvula both midline controlling oral secretions tonsils both equal/bilaterally, no submandibular swelling. Eyes:     General: No scleral icterus.       Right eye: No discharge.        Left eye: No discharge.     Conjunctiva/sclera: Conjunctivae normal.  Cardiovascular:     Rate and Rhythm: Normal rate and regular rhythm.  Pulmonary:     Effort: Pulmonary effort is normal.  Musculoskeletal:     Cervical back: Neck supple.  Skin:    General: Skin is warm and dry.  Neurological:     Mental Status: He is alert.  Psychiatric:        Mood and Affect: Mood normal.     ED Results / Procedures / Treatments   Labs (all labs ordered are listed, but only abnormal results are displayed) Labs Reviewed  BASIC METABOLIC PANEL - Abnormal; Notable for the following components:      Result Value   Glucose, Bld 102 (*)    All other components within normal limits  PROTIME-INR - Abnormal; Notable for the following components:   Prothrombin Time 26.9 (*)    INR 2.5 (*)    All other components within normal limits  CBC WITH DIFFERENTIAL/PLATELET  CBG MONITORING, ED    EKG None  Radiology No results found.  Procedures Wound packing  Date/Time: 06/05/2022 6:14 AM  Performed by: Marcello Fennel, PA-C Authorized by: Marcello Fennel, PA-C   Consent: Verbal consent obtained. Risks and benefits: risks, benefits and alternatives were discussed Consent given by: patient Required items: required blood products, implants, devices, and special equipment available Patient identity confirmed: verbally with patient Local anesthesia used: yes  Anesthesia: Local anesthesia used: yes Local Anesthetic: lidocaine 2% with epinephrine Anesthetic total: 3 mL  Sedation: Patient sedated: no  Patient tolerance: patient tolerated the procedure well with no immediate complications       Medications Ordered in ED Medications  aminocaproic acid (AMICAR) oral solution 50 mg/mL (5%), 100 ml (10 mLs Oral Given 06/05/22 0601)  lidocaine-EPINEPHrine (XYLOCAINE W/EPI) 2 %-1:200000 (PF) injection 10 mL (10 mLs Infiltration Given 06/05/22 0549)    ED Course/ Medical Decision Making/ A&P                             Medical Decision Making Amount and/or Complexity of Data Reviewed Labs: ordered.  Risk Prescription drug management.   This patient presents to the ED for concern of dental bleeding, this involves an extensive number of treatment options, and is a complaint that carries with it a high risk of complications and morbidity.  The differential diagnosis includes infection, anemia, elevated INR    Additional history obtained:  Additional history obtained from N/A External records from outside source obtained and reviewed including recent cardiology notes   Co morbidities that complicate the patient evaluation  CHF, on anticoag's  Social Determinants of Health:  N/A    Lab Tests:  I Ordered, and personally interpreted labs.  The pertinent results include: CBC is unremarkable, BMP is unremarkable, prothrombin time is 26.9, INR 2.6 glucose 91   Imaging Studies ordered:  I ordered imaging studies including N/A I independently visualized and interpreted imaging which showed N/A I agree with the radiologist  interpretation   Cardiac Monitoring:  The patient was maintained on a cardiac monitor.  I personally viewed and interpreted the cardiac monitored which showed an underlying rhythm of:  N/A   Medicines ordered and prescription drug management:  I ordered medication including amicar I have reviewed the patients home medicines and have made adjustments as needed  Critical Interventions:  N/a   Reevaluation:  Presents with dental bleeding, she was obtain basic lab work which I personally viewed is unremarkable, patient was packed with teabags, states his bleeding has improved, on my evaluation he still continues to bleed, will provide him with Amicar and reassess  Reassessment patient continues to bleed, will soak Amicar and gauze and repacked.  Patient still continues to bleed, recommend injecting lido with epi packing the wound.  Patient was agreed this plan he tolerated well we will continue to monitor  Patient was reassessed, bleeding has since resolved, he is agreement with discharge at this time.   Consultations Obtained:  N/A   Test Considered:  N/A    Rule out Suspicion patient will require blood transfusion at this time as he is hemodynamically stable, CBC shows no evidence of anemia.  I doubt patient will need reversal of warfarin as INR is within normal limits of 2.5.  Suspicion for systemic infection is low as he is afebrile, nontachycardic, no leukocytosis present on CBC.    Dispostion and problem list  After consideration of the diagnostic results and the patients response to treatment, I feel that the patent would benefit from discharge.  Dental bleeding-likely worsened after dental procedure, wound has been packed, will have him follow-up with his dentist for further evaluation and strict return precautions.            Final Clinical Impression(s) / ED Diagnoses Final diagnoses:  Dental injury, initial encounter    Rx / DC Orders ED  Discharge Orders     None         Marcello Fennel, PA-C 06/05/22 [id number]    Orpah Greek, MD 06/06/22 [phone]

## 2022-06-05 NOTE — Telephone Encounter (Signed)
CSW attempted to reach out to pt to discuss food insecurity- unable to reach- left VM requesting return call. Put notes into next appt to meet with CSW regarding these concerns.  Jorge Ny, LCSW Clinical Social Worker [facility] Desk#: [phone] Cell#: [phone]

## 2022-06-05 NOTE — Discharge Instructions (Signed)
Likely your excessive dental bleeding was due to the medication you are on called warfarin.  I have packed your wound which should help prevent continued bleeding.  If you note that you have bleeding again I recommend using a teabag, first wet it and then placing it at the site of bleeding.  Hold pressure there for about 30 minutes.  You may do this up to 3 times, if after the third time it does not stop the bleeding please come back in for reassessment.  Please do not chew on that side or excessive sucking this will worsen the bleeding.  The packing in your mouth should be removed by tomorrow, you may remove it or you may have your dentist do it, if the packing gets dislodged before tomorrow please do not attempt to repack it.  Please call your dentist today for further assessment.  Come back to the emergency department if you develop chest pain, shortness of breath, severe abdominal pain, uncontrolled nausea, vomiting, diarrhea.

## 2022-06-05 NOTE — Telephone Encounter (Signed)
-----   Message from Ena Dawley sent at 06/05/2022  1:45 PM EST ----- Regarding: RE: [facility] referral Sent referral to Dr [facility]  Ph. [place] # [phone] Address [address], [place], [place] [address] They will contact the patient to schedule an appointment   ----- Message ----- From: Ladell Pier, MD Sent: 06/03/2022  12:49 PM EST To: Ena Dawley Subject: [facility] referral                                    Please try to get him in with behavioral health.

## 2022-06-06 NOTE — Telephone Encounter (Signed)
Spoke with Education officer, museum at [employer]. Confirmed pt's procedure was performed on 06/04/22 and he was not instructed to hold his Warfarin.  Will check pt's INR Tuesday, 06/11/22 at Anticoagulation Clinic.

## 2022-06-06 NOTE — Telephone Encounter (Addendum)
Called pt to determine when dental procedure was schedule. Pt stated he already had the procedure on 06/04/22; however he was in the ED on 06/04/22 due to his mouth bleeding post procedure. Pt stated he has not taken his Warfarin since the procedure (06/04/22). Pt states he no longer has any signs or symptoms of bleeding and will restart his Warfarin today. Scheduled pt an appt with Anticoagulation clinic on Tuesday, 06/11/22. Pt verbalized understanding.   Called [facility] to confirm procedure has already been performed. Had to leave message on voicemail.

## 2022-06-10 ENCOUNTER — Telehealth (HOSPITAL_COMMUNITY): Payer: Self-pay | Admitting: Licensed Clinical Social Worker

## 2022-06-11 ENCOUNTER — Ambulatory Visit: Payer: Medicaid Other | Attending: Cardiology | Admitting: *Deleted

## 2022-06-11 DIAGNOSIS — Z5181 Encounter for therapeutic drug level monitoring: Secondary | ICD-10-CM | POA: Insufficient documentation

## 2022-06-11 DIAGNOSIS — Z7901 Long term (current) use of anticoagulants: Secondary | ICD-10-CM | POA: Insufficient documentation

## 2022-06-11 DIAGNOSIS — I639 Cerebral infarction, unspecified: Secondary | ICD-10-CM | POA: Insufficient documentation

## 2022-06-11 DIAGNOSIS — I513 Intracardiac thrombosis, not elsewhere classified: Secondary | ICD-10-CM

## 2022-06-11 LAB — POCT INR: POC INR: 1.1

## 2022-06-11 NOTE — Patient Instructions (Signed)
Description   -Take 2 tablets of warfarin today  -Tomorrow take 1.5 tablets of warfarin  -Then continue warfarin 1.5 tablets daily except for 1 tablet on Mondays, Wednesday and Fridays.  Stay consistent with leafy veggies.  Recheck INR in 1 week.  [facility] [phone]

## 2022-06-13 ENCOUNTER — Other Ambulatory Visit: Payer: Self-pay | Admitting: Internal Medicine

## 2022-06-14 ENCOUNTER — Encounter (HOSPITAL_COMMUNITY): Payer: Self-pay

## 2022-06-14 NOTE — Telephone Encounter (Signed)
Requested medication (s) are due for refill today: yes  Requested medication (s) are on the active medication list: no  Last refill:  04/05/21  Future visit scheduled: yes  Notes to clinic:  Unable to refill per protocol, Rx expired. Not on current list, routing for approval.     Requested Prescriptions  Pending Prescriptions Disp Refills   EASY COMFORT PEN NEEDLES 32G X 4 MM [place] [Pharmacy Med Name: EASY COMFORT PEN NEEDLES 32G X 4 MM] 200 each 2    Sig: USE TO [place] A DAY     Endocrinology: Diabetes - Testing Supplies Passed - 06/13/2022  1:29 PM      Passed - Valid encounter within last 12 months    Recent Outpatient Visits           1 week ago Type 2 diabetes mellitus with diabetic polyneuropathy, with long-term current use of insulin ([place])   [place], Neoma Laming B, MD   1 month ago Type 2 diabetes mellitus with diabetic polyneuropathy, with long-term current use of insulin [facility])   [place], [place], [place]   2 months ago Erectile dysfunction associated with type 2 diabetes mellitus [facility])   [place] Karle Plumber B, MD   4 months ago Type 2 diabetes mellitus with diabetic polyneuropathy, with long-term current use of insulin [facility])   [place] Karle Plumber B, MD   1 year ago Type 2 diabetes mellitus with diabetic polyneuropathy, with long-term current use of insulin [facility])   [place], RPH-CPP       Future Appointments             In 2 weeks Penumalli, Earlean Polka, MD [facility]   In 1 month Wynetta Emery, Dalbert Batman, MD [place]   In 3 months Wynetta Emery, Dalbert Batman, MD [place]

## 2022-06-14 NOTE — Telephone Encounter (Signed)
Will forward to office disability support person as patient is in need of:  Supporting documents include any medical report, form, or written statement related to your disability.  With a time sensitive request, will mark a urgent

## 2022-06-14 NOTE — Telephone Encounter (Signed)
H&V Care Navigation CSW Progress Note  Clinical Social Worker informed by patient wife that they need help with March rent.  CSW attempted to contact pt landlord to discuss assisting with payment but unable to reach.  Informed pt wife of this through email- will continue attempts.   SDOH Screenings   Food Insecurity: No Food Insecurity (09/14/2020)  Recent Concern: Food Insecurity - Food Insecurity Present (09/13/2020)  Housing: Medium Risk (01/28/2022)  Transportation Needs: Unmet Transportation Needs (12/15/2020)  Utilities: At Risk (12/18/2021)  Depression (PHQ2-9): Low Risk  (01/18/2022)  Recent Concern: Depression (PHQ2-9) - Medium Risk (11/13/2021)  Financial Resource Strain: High Risk (10/18/2021)  Tobacco Use: Low Risk  (06/04/2022)   Jorge Ny, LCSW Clinical Social Worker [facility] Desk#: [phone] Cell#: [phone]

## 2022-06-17 ENCOUNTER — Telehealth (HOSPITAL_COMMUNITY): Payer: Self-pay | Admitting: Licensed Clinical Social Worker

## 2022-06-17 NOTE — Telephone Encounter (Signed)
H&V Care Navigation CSW Progress Note  Clinical Social Worker spoke with management company about assisting with rent- they are agreeable to work with [place].  CSW called pt wife to inform of this and that they still owe their half of the rent by 3/5 or they will get a late fee.  Pt wife states they ended up being able to pay the whole amount and will plan to submit payment today.  CSW reiterated that we could assist if this would be too much of a strain but she states they will plan to pay and will reach out in future if they need assistance.   SDOH Screenings   Food Insecurity: No Food Insecurity (09/14/2020)  Recent Concern: Food Insecurity - Food Insecurity Present (09/13/2020)  Housing: Medium Risk (01/28/2022)  Transportation Needs: Unmet Transportation Needs (12/15/2020)  Utilities: At Risk (12/18/2021)  Depression (PHQ2-9): Low Risk  (01/18/2022)  Recent Concern: Depression (PHQ2-9) - Medium Risk (11/13/2021)  Financial Resource Strain: High Risk (10/18/2021)  Tobacco Use: Low Risk  (06/04/2022)    Jorge Ny, LCSW Clinical Social Worker [facility] Desk#: [phone] Cell#: [phone]

## 2022-06-18 ENCOUNTER — Ambulatory Visit: Payer: Medicaid Other | Attending: Cardiology | Admitting: *Deleted

## 2022-06-18 DIAGNOSIS — I513 Intracardiac thrombosis, not elsewhere classified: Secondary | ICD-10-CM | POA: Insufficient documentation

## 2022-06-18 DIAGNOSIS — Z5181 Encounter for therapeutic drug level monitoring: Secondary | ICD-10-CM | POA: Diagnosis not present

## 2022-06-18 DIAGNOSIS — Z7901 Long term (current) use of anticoagulants: Secondary | ICD-10-CM | POA: Diagnosis not present

## 2022-06-18 DIAGNOSIS — I639 Cerebral infarction, unspecified: Secondary | ICD-10-CM | POA: Diagnosis not present

## 2022-06-18 LAB — POCT INR: POC INR: 1.9

## 2022-06-18 NOTE — Patient Instructions (Signed)
Description   -Take 2 tablets of warfarin today  -Then continue warfarin 1.5 tablets daily except for 1 tablet on Mondays, Wednesday and Fridays.  Stay consistent with leafy veggies.  Recheck INR in 2 week.  Coumadin Clinic [phone]

## 2022-06-25 NOTE — Progress Notes (Signed)
Remote ICD transmission.   

## 2022-06-27 NOTE — Progress Notes (Signed)
PCP: Dr. Karle Plumber Primary Cardiologist: Dr. Aundra Dubin  HPI: 37 y.o.male with diabetes, HTN, and a long history of nonischemic cardiomyopathy. He had a cardiac MRI in 1/08 showing low EF, but he says that he had been told about "heart problems" even prior to that.  He does not have a family history of cardiomyopathy that he knows of, but does not know his father's family.  Coronary CT angiogram in 2014 showed no CAD. Echo in 2/17 showed EF 20-25%, similar to 2014. Had repeat echo in 2020 showing LVEF 15-20%, RV normal. He had COVID-19 in 4/22.   He presented to [facility] ED 09/11/20 with tingling in left arm, left face, drooling and blank stare for about 1 week, and admitted for possible seizure, HHS and possible CVA.  In ED, he had left-sided neurologic deficit with dysarthria.  CT head with probable subacute infarct in right frontal lobe and old right parietal infarct.  Reportedly, patient's neurologic deficits resolved in ED, and focus shifted to possible seizure versus stroke.  He was loaded with Keppra.  MRI brain with probable hemorrhagic infarct involving the anterior right frontal lobe and left parietal occipital cortex with additional chronic right parietal infarct, right frontotemporal region enhancement suggesting changes related to acute seizure, possible toxic insult involving the caudate and lentiform nuclei bilaterally.  Neurology recommended interval follow-up brain MRI.  CTA head and neck without significant finding.  TTE with EF of 10-15%, LV thrombus, moderate RV dysfunction and dilated IVC.  He was discharged on GDMT, but A1c too high for SGLT2i.    Seen in [facility] ED 09/21/20 for fall after syncopal episode on toilet, hitting his head. CT of the head was significant for no bleeding but new low-density left cerebellum suggesting acute infarct. EEG unremarkable and felt fall secondary to vagal episode not seizure.  He had atypical chest pain at his hospital follow up. With his history of HLD and DM,  decisions was made to undergo [facility] to evaluate coronary anatomy and hemodynamics.  R/LHC (7/22) showed normal filling pressures, preserved cardiac output and no significant CAD. RA mean 1, RV 28/1, PA 19/7 mean 12, PCWP 3, CO/CI 5.54/2.92.  Again admitted 12/14/20-12/16/20 for syncopal episode. CT head negative. He was given IVF, beta blocker/spiro/Entresto held initially. Suspected orthostatic vs micturition syncope. EP consulted with known low EF and plans for ICD implant when a1c better controlled.  CPX in 9/22 showed severe HF limitation. Echo was done today and reviewed, EF < 20% with mild LV dilation, no LV thrombus noted, normal RV size with mildly decreased systolic function, IVC normal.   Followup 11/22, weight up about 10 lbs.  Patient had a seizure thought to be related to hypoglycemia (paramedics called, improved with glucose). Lasix 20 mg started every other day, eventually increased to daily.   [place] 3/23 at [facility] as part of pre-transplant work up showed normal pressures, RA 7, RV 23/4, PA 14, PCWP 11, CI 3.5.  Patient is currently being worked up for transplant listing.     Medtronic ICD placed 5/23.   Echo 1/24, EF < 20% with global hypokinesis, normal RV size and systolic function.   RHC (2/24) RA 5, PA 23/12 (mean 14), PCWP mean 8, CO/CI (Fick) 4.7/2.7  CPX (2/24) peak VO2 13.2, VE/VCO2 slope 19, RER 1.14 => severe HF limitation.    Today he returns for HF follow up. He remains fatigued, more so over the past couple of months. He has mild dyspnea walking further distances on flat ground.  He is chronically dizzy.  He feels his arms and legs are swelling. Denies palpitations, CP, or PND/Orthopnea. Appetite ok. No fever or chills. Weight at home 150 pounds. Taking all medications. He and his wife recently moved, he attributes his worsening symptoms to not continuing to exercise after completing the PREP program and cardiac rehab. He goes to [facility] for follow up on 07/04/22.   Labs  (9/22): K 3.5, creatinine 0.85, digoxin 0.4 Labs (10/22): K 4.1, creatinine 0.8, digoxin 0.9 Labs (3/23): K 4.5, creatinine 0.9 Labs (4/23): K 4.6, creatinine 0.89 Labs (12/23): hgb A1c 7.6, K 3.8, creatinine 0.94 Labs (1/24): LDL 98 Labs (2/24): K 4.1, creatinine 0.86  ECG (personally reviewed): none ordered today.  ROS: All systems negative except as listed in HPI, PMH and Problem List.  PMH:  1. Type 1 diabetes: Generally poorly controlled.  2. 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  3. H/o LV thrombus 4. Syncope: Vagal vs orthostatic, likely not arrhythmic.  5. Diabetic polyneuropathy.  6. Chronic systolic CHF: Nonischemic cardiomyopathy.  Long-standing.  - Cardiac MRI (1/08): EF 45%, no late gadolinium enhancement.  - Coronary CTA (7/14): No significant CAD - Echo (7/14): EF 20-25%, severe LV dilation.  - Echo (6/22): EF 10-15%, LV thrombus, mild LV dilation, moderately decreased RV systolic function with normal size, mild MR, dilated IVC.  - LHC/RHC (7/22): No CAD; mean RA 1, PA 19/7, mean PCWP 3, CI 2.92.  - Echo (10/22): EF < 20%, mild LV dilation, normal RV size with mildly decreased systolic function, IVC normal.  - CPX (9/22): peak VO2 10.4, VE/VCO2 slope 26, RER 1.22 => severe HF limitation.  - RHC (3/23) at [facility] RA 7, RV 23/4, PA 14, PCWP 11, CI 3.5 - Medtronic ICD placed 5/23.  - Echo (1/24): EF < 20% with global hypokinesis, normal RV size and systolic function.  - RHC (2/24): RA 5, PA 23/12 (mean 14), PCWP mean 8, CO/CI (Fick) 4.7/2.7 - CPX (2/24): peak VO2 13.2, VE/VCO2 slope 19, RER 1.14 => severe HF limitation.  7. 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   SH:  Social History   Socioeconomic History   Marital status: Married    Spouse name: [name]   Number of children: 0   Years of education: 13   Highest education level: Some college, no degree  Occupational History   Occupation: unemployed  Tobacco Use   Smoking  status: Never   Smokeless tobacco: Never  Vaping Use   Vaping Use: Not on file  Substance and Sexual Activity   Alcohol use: Not Currently    Comment: "occasional" when "hanging out with the wrong people" No recent use.   Drug use: Not Currently    Types: Marijuana    Comment: occasional, last 2013   Sexual activity: Not Currently  Other Topics Concern   Not on file  Social History Narrative   Pt lives at home with his wife    Right handed    Caffeine- hardly any   Social Determinants of Health   Financial Resource Strain: High Risk (10/18/2021)   Overall Financial Resource Strain (CARDIA)    Difficulty of Paying Living Expenses: Hard  Food Insecurity: No Food Insecurity (09/14/2020)   Hunger Vital Sign    Worried About Running Out of Food in the Last Year: Never true    Ran Out of Food in the Last Year: Never true  Recent Concern: [place] Present (09/13/2020)  Hunger Vital Sign    Worried About Running Out of Food in the Last Year: Never true    Ran Out of Food in the Last Year: Sometimes true  Transportation Needs: Unmet Transportation Needs (12/15/2020)   PRAPARE - Hydrologist (Medical): No    Lack of Transportation (Non-Medical): Yes  Physical Activity: Not on file  Stress: Not on file  Social Connections: Not on file  Intimate Partner Violence: Not on file   FH:  Family History  Problem Relation Age of Onset   Stroke Mother    Diabetes Mother    Hypertension Mother    Stroke Maternal Aunt    Heart attack Neg Hx     Current Outpatient Medications  Medication Sig Dispense Refill   acetaminophen (TYLENOL) 325 MG tablet Take 1-2 tablets (325-650 mg total) by mouth every 4 (four) hours as needed for mild pain.     atorvastatin (LIPITOR) 80 MG tablet TAKE 1 TABLET (80 MG TOTAL) BY MOUTH DAILY (EVENING) 90 tablet 1   Blood Pressure Monitor DEVI Use as directed to check home blood pressure 2-3 times a week 1 Device 0    Continuous Blood Gluc Sensor (FREESTYLE LIBRE 2 SENSOR) MISC USE EVERY 14 (FOURTEEN) DAYS. 6 each 1   CORLANOR 5 MG TABS tablet TAKE 1 TABLET (5 MG TOTAL) BY MOUTH 2 (TWO) TIMES DAILY WITH A MEAL (AM+EVENING) 60 tablet 6   dapagliflozin propanediol (FARXIGA) 5 MG TABS tablet TAKE 1 TABLET (5 MG TOTAL) BY MOUTH DAILY BEFORE BREAKFAST. 30 tablet 11   dicyclomine (BENTYL) 20 MG tablet Take 1 tablet (20 mg total) by mouth 2 (two) times daily. 20 tablet 0   digoxin (LANOXIN) 0.125 MG tablet Take 1 tablet (0.125 mg total) by mouth daily. Please request Cardiologist for future refills. 30 tablet 6   DULoxetine (CYMBALTA) 30 MG capsule Take 1 capsule (30 mg total) by mouth daily. 30 capsule 5   EASY COMFORT PEN NEEDLES 32G X 4 MM MISC USE TO INJECT INSULIN FOUR TIMES A DAY 200 each 2   Evolocumab (REPATHA SURECLICK) [id number] MG/ML SOAJ Inject 140 mg into the skin every 14 (fourteen) days. 2 mL 11   ezetimibe (ZETIA) 10 MG tablet TAKE 1 TABLET (10 MG TOTAL) BY MOUTH DAILY (AM) 30 tablet 3   furosemide (LASIX) 20 MG tablet Take 1 tablet (20 mg total) by mouth 2 (two) times a week. Mondays Wednesdays and Friday (Patient taking differently: Take 20 mg by mouth 2 (two) times a week. Monday and Friday) 30 tablet 11   glucose blood (ACCU-CHEK GUIDE) test strip Use to check blood sugar three times daily. 100 each 2   insulin aspart (FIASP FLEXTOUCH) 100 UNIT/ML FlexTouch Pen Inject 5-8 Units into the skin 3 (three) times daily before meals. (Patient taking differently: Inject 5-10 Units into the skin 3 (three) times daily before meals. Sliding scale) 30 mL 3   Insulin Syringe-Needle U-100 (RELION INSULIN SYRINGE) 31G X 15/64" 0.3 ML MISC Use to inject insulin daily. 100 each 11   levETIRAcetam (KEPPRA) 750 MG tablet Take 2 tablets (1,500 mg total) by mouth 2 (two) times daily. 360 tablet 4   loperamide (IMODIUM) 2 MG capsule Take 1 capsule (2 mg total) by mouth 4 (four) times daily as needed for diarrhea or loose  stools. 12 capsule 0   Menthol, Topical Analgesic, (ICY HOT BACK EX) Apply 1 application  topically daily as needed (neuropathy). Icy hot cream  metFORMIN (GLUCOPHAGE-XR) 500 MG 24 hr tablet Take 1 tablet (500 mg total) by mouth 2 (two) times daily with a meal. 180 tablet 3   metoprolol (TOPROL-XL) 200 MG 24 hr tablet TAKE 0.5 TABLETS (100 MG TOTAL) BY MOUTH DAILY. TAKE WITH OR IMMEDIATELY FOLLOWING A MEAL (EVENING) 15 tablet 6   Oxcarbazepine (TRILEPTAL) 300 MG tablet Take 2 tablets (600 mg total) by mouth 2 (two) times daily. 360 tablet 4   phenytoin (DILANTIN) 100 MG ER capsule Take 1 capsule (100 mg total) by mouth 3 (three) times daily. 270 capsule 4   potassium chloride SA (KLOR-CON M) 20 MEQ tablet Take 1 tablet (20 mEq total) by mouth 2 (two) times a week. Take every Mon, Wed and Fri with Lasix (Patient taking differently: Take 20 mEq by mouth 2 (two) times a week. Take every Mon,and Fri with Lasix) 30 tablet 4   pregabalin (LYRICA) 300 MG capsule Take 1 capsule (300 mg total) by mouth at bedtime. 30 capsule 5   sacubitril-valsartan (ENTRESTO) 97-103 MG Take 1 tablet by mouth 2 (two) times daily. 60 tablet 11   sildenafil (VIAGRA) 50 MG tablet 1 tab p.o. half hour prior to intercourse PRN.  Limit use to 1 tab/24 hr 10 tablet 6   spironolactone (ALDACTONE) 25 MG tablet TAKE 1 TABLET (25 MG TOTAL) BY MOUTH AT BEDTIME. 90 tablet 1   TRESIBA FLEXTOUCH 200 UNIT/ML FlexTouch Pen INJECT 20 UNITS INTO THE SKIN DAILY. 18 mL 3   warfarin (COUMADIN) 5 MG tablet TAKE 1 TO 1 & 1/2 TABLETS (5 TO 7.5 MG TOTAL) BY MOUTH DAILY. 40 tablet 3   No current facility-administered medications for this encounter.   BP 98/60   Pulse 75   Ht 5\' 11"  (1.803 m)   Wt 70.7 kg (155 lb 12.8 oz)   SpO2 98%   BMI 21.73 kg/m   Wt Readings from Last 3 Encounters:  06/28/22 70.7 kg (155 lb 12.8 oz)  06/03/22 73.4 kg (161 lb 12.8 oz)  05/17/22 70.8 kg (156 lb)   PHYSICAL EXAM: General:  NAD. No resp difficulty,  walked into clinic HEENT: Normal Neck: Supple. No JVD. Carotids 2+ bilat; no bruits. No lymphadenopathy or thryomegaly appreciated. Cor: PMI nondisplaced. Regular rate & rhythm. No rubs, gallops or murmurs. Lungs: Clear Abdomen: Soft, nontender, nondistended. No hepatosplenomegaly. No bruits or masses. Good bowel sounds. Extremities: No cyanosis, clubbing, rash, edema Neuro: Alert & oriented x 3, cranial nerves grossly intact. Moves all 4 extremities w/o difficulty. Affect pleasant.  ASSESSMENT & PLAN:  1. Chronic systolic CHF: Nonischemic cardiomyopathy known for years (since at least 2008).  Cardiac MRI in 2008 with EF 45%, no LGE.  Normal coronary CTA in 2014.  Echo (7/22) with EF 10-15%, LV thrombus, moderate RV dysfunction, dilated IVC. R/LHC (6/22) showed normal filling pressures, preserved cardiac output and no significant CAD. CPX in 9/22 showed severe HF limitation concerning for advanced HF.  Echo in 9/22 showed EF < 20% with mild LV dilation, no LV thrombus noted, normal RV size with mildly decreased systolic function, IVC normal. Medtronic ICD placed 5/23. Echo 1/24 showed EF < 20% with global hypokinesis, normal RV size and systolic function.  [place] 2/24 showed normal filling pressures with preserved cardiac output. CPX test 2/24 showed severe HF limitation. NYHA class III symptoms chronically but more fatigued recently. He is not volume overloaded on exam. Ongoing workup at [facility] for heart transplant. He is blood type A.  - With soft BP and  worsening fatigue, decrease Entresto back to 49/51 mg bid. BMET and BNP today. - Continue spironolactone 25 mg qhs.  - Continue Farxiga 5 mg daily (this was started by endocrinology), ? if his endocrinologist agreeable to increasing to 10 mg. - Continue Lasix  twice a week and KCl twice a week.   - Continue Toprol XL 100 mg daily. - Continue Ivabradine 5 mg bid.  - Continue digoxin 0.125. Dig level 0.4 (04/30/22) - Though cardiac output was preserved  on 6/22 and 3/23 RHCs, CPX in 9/22 showed a severe HF limitation concerning for advanced HF. RHC (2/24) with preserved cardiac output, CPX (2/24) with severe HF limitation. He has been seen by [facility] for consideration of transplant.  Ongoing workup. He has follow up later this month. 2. Syncope: Multiple prior episodes though none recently.  ?Orthostatic-related vs related to hypo- or hyperglycemia, no arrhythmia has been noted.  He has an ICD.  3. Diabetes: Insulin-dependent, started in his 27s.  Control better recently but has been poor in past.  - Followed by endocrinology. Now on Farxiga, would hold off on increasing to 10 mg with dizziness and fatigue. 4. 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  - On keppra, dilantin 5. 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  Followed by Neurology. - On Coumadin. - On statin, good lipids 1/24 6. LV Thrombus: with associated CVA. Most recent echo did not show a thrombus.  - On Coumadin. No bleeding issues.  Follow up in 3 months with Dr. Wynema Birch [facility] FNP-BC 06/28/2022

## 2022-06-28 ENCOUNTER — Encounter (HOSPITAL_COMMUNITY): Payer: Self-pay

## 2022-06-28 ENCOUNTER — Ambulatory Visit (HOSPITAL_COMMUNITY)
Admission: RE | Admit: 2022-06-28 | Discharge: 2022-06-28 | Disposition: A | Discharge status: Home / Self Care | Payer: Medicaid Other | Source: Ambulatory Visit | Attending: Family Medicine | Admitting: Family Medicine

## 2022-06-28 VITALS — BP 98/60 | HR 75 | Ht 71.0 in | Wt 155.8 lb

## 2022-06-28 DIAGNOSIS — R5383 Other fatigue: Secondary | ICD-10-CM | POA: Insufficient documentation

## 2022-06-28 DIAGNOSIS — I428 Other cardiomyopathies: Secondary | ICD-10-CM | POA: Diagnosis not present

## 2022-06-28 DIAGNOSIS — Z79899 Other long term (current) drug therapy: Secondary | ICD-10-CM | POA: Insufficient documentation

## 2022-06-28 DIAGNOSIS — I5022 Chronic systolic (congestive) heart failure: Secondary | ICD-10-CM | POA: Insufficient documentation

## 2022-06-28 DIAGNOSIS — E1165 Type 2 diabetes mellitus with hyperglycemia: Secondary | ICD-10-CM

## 2022-06-28 DIAGNOSIS — R569 Unspecified convulsions: Secondary | ICD-10-CM | POA: Diagnosis not present

## 2022-06-28 DIAGNOSIS — R55 Syncope and collapse: Secondary | ICD-10-CM | POA: Diagnosis not present

## 2022-06-28 DIAGNOSIS — I513 Intracardiac thrombosis, not elsewhere classified: Secondary | ICD-10-CM

## 2022-06-28 DIAGNOSIS — Z7984 Long term (current) use of oral hypoglycemic drugs: Secondary | ICD-10-CM | POA: Insufficient documentation

## 2022-06-28 DIAGNOSIS — I110 Hypertensive heart disease with heart failure: Secondary | ICD-10-CM | POA: Insufficient documentation

## 2022-06-28 DIAGNOSIS — I639 Cerebral infarction, unspecified: Secondary | ICD-10-CM

## 2022-06-28 DIAGNOSIS — Z87898 Personal history of other specified conditions: Secondary | ICD-10-CM

## 2022-06-28 DIAGNOSIS — Z7901 Long term (current) use of anticoagulants: Secondary | ICD-10-CM | POA: Diagnosis not present

## 2022-06-28 DIAGNOSIS — Z794 Long term (current) use of insulin: Secondary | ICD-10-CM | POA: Diagnosis not present

## 2022-06-28 DIAGNOSIS — Z8673 Personal history of transient ischemic attack (TIA), and cerebral infarction without residual deficits: Secondary | ICD-10-CM | POA: Diagnosis not present

## 2022-06-28 DIAGNOSIS — Z95810 Presence of automatic (implantable) cardiac defibrillator: Secondary | ICD-10-CM | POA: Insufficient documentation

## 2022-06-28 LAB — BASIC METABOLIC PANEL
Anion gap: 9 (ref 5–15)
BUN: 24 mg/dL — ABNORMAL HIGH (ref 6–20)
CO2: 25 mmol/L (ref 22–32)
Calcium: 8.6 mg/dL — ABNORMAL LOW (ref 8.9–10.3)
Chloride: 100 mmol/L (ref 98–111)
Creatinine, Ser: 0.94 mg/dL (ref 0.61–1.24)
GFR, Estimated: >60 mL/min (ref >60)
Glucose, Bld: 274 mg/dL — ABNORMAL HIGH (ref 70–99)
Potassium: 4.9 mmol/L (ref 3.5–5.1)
Sodium: 134 mmol/L — ABNORMAL LOW (ref 135–145)

## 2022-06-28 LAB — BRAIN NATRIURETIC PEPTIDE: B Natriuretic Peptide: 18.1 pg/mL (ref 0.0–100.0)

## 2022-06-28 MED ORDER — ENTRESTO 49-51 MG PO TABS: 1.0000 | ORAL_TABLET | Freq: Two times a day (BID) | ORAL | 8 refills | Status: DC

## 2022-06-28 NOTE — Patient Instructions (Addendum)
Thank you for coming in today  Labs were done today, if any labs are abnormal the clinic will call you No news is good news  Medications: DECREASE Entresto to 49/51 mg 1 tablet twice daily   Follow up appointments:  Your physician recommends that you schedule a follow-up appointment in:  3 months with Dr. Aundra Dubin     Do the following things EVERYDAY: Weigh yourself in the morning before breakfast. Write it down and keep it in a log. Take your medicines as prescribed Eat low salt foods--Limit salt (sodium) to 2000 mg per day.  Stay as active as you can everyday Limit all fluids for the day to less than 2 liters   At the [facility], you and your health needs are our priority. As part of our continuing mission to provide you with exceptional heart care, we have created designated Provider Care Teams. These Care Teams include your primary Cardiologist (physician) and Advanced Practice Providers (APPs- Physician Assistants and Nurse Practitioners) who all work together to provide you with the care you need, when you need it.   You may see any of the following providers on your designated Care Team at your next follow up: Dr Glori Bickers Dr Loralie Champagne Dr. Roxana Hires, NP Lyda Jester, [place] [facility], [place] Forestine Na, NP Audry Riles, PharmD   Please be sure to bring in all your medications bottles to every appointment.    Thank you for choosing [facility]  If you have any questions or concerns before your next appointment please send [place] a message through [place] or call our office at [phone].    TO LEAVE A MESSAGE FOR THE NURSE SELECT OPTION 2, PLEASE LEAVE A MESSAGE INCLUDING: YOUR NAME DATE OF BIRTH CALL BACK NUMBER REASON FOR CALL**this is important as we prioritize the call backs  YOU WILL RECEIVE A CALL BACK THE SAME DAY AS LONG AS YOU CALL BEFORE 4:00 PM

## 2022-07-01 ENCOUNTER — Telehealth (HOSPITAL_COMMUNITY): Payer: Self-pay

## 2022-07-01 ENCOUNTER — Encounter: Payer: Medicaid Other | Admitting: Diagnostic Neuroimaging

## 2022-07-01 ENCOUNTER — Encounter: Payer: Self-pay | Admitting: Diagnostic Neuroimaging

## 2022-07-01 ENCOUNTER — Encounter: Payer: Self-pay | Admitting: Neurology

## 2022-07-01 NOTE — Progress Notes (Unsigned)
    Virtual Visit via Video Note  I connected with Jason Stevenson on 07/01/22 at  1:30 PM EDT by a video enabled telemedicine application and verified that I am speaking with the correct person using two identifiers.   I discussed the limitations of evaluation and management by telemedicine and the availability of in person appointments. The patient expressed understanding and agreed to proceed.  Patient is at their home. I am at the office.    History of Present Illness:  ***    Observations/Objective:  ***   Assessment and Plan:  Dx:  No diagnosis found.   ***    Follow Up Instructions:  - No follow-ups on file.    I discussed the assessment and treatment plan with the patient. The patient was provided an opportunity to ask questions and all were answered. The patient agreed with the plan and demonstrated an understanding of the instructions.   The patient was advised to call back or seek an in-person evaluation if the symptoms worsen or if the condition fails to improve as anticipated.  I provided *** minutes of non-face-to-face time during this encounter.   Suanne Marker, MD 07/01/2022, 1:32 PM Certified in Neurology, Neurophysiology and Neuroimaging  [facility] [address] [place], [place] [address] [phone]

## 2022-07-01 NOTE — Telephone Encounter (Signed)
Patient aware of labs.  

## 2022-07-02 ENCOUNTER — Ambulatory Visit: Payer: Medicaid Other | Attending: Cardiovascular Disease | Admitting: *Deleted

## 2022-07-02 DIAGNOSIS — I639 Cerebral infarction, unspecified: Secondary | ICD-10-CM | POA: Diagnosis not present

## 2022-07-02 DIAGNOSIS — I513 Intracardiac thrombosis, not elsewhere classified: Secondary | ICD-10-CM

## 2022-07-02 DIAGNOSIS — Z7901 Long term (current) use of anticoagulants: Secondary | ICD-10-CM | POA: Insufficient documentation

## 2022-07-02 DIAGNOSIS — Z5181 Encounter for therapeutic drug level monitoring: Secondary | ICD-10-CM | POA: Diagnosis not present

## 2022-07-02 LAB — POCT INR: INR: 1.7 — AB (ref 2.0–3.0)

## 2022-07-02 NOTE — Patient Instructions (Signed)
Description   Take 2 tablets of warfarin today then start warfarin 1.5 tablets daily except for 1 tablet on Mondays and Fridays. Stay consistent with leafy veggies. Recheck INR in 2 weeks. Coumadin Clinic [phone]

## 2022-07-03 ENCOUNTER — Other Ambulatory Visit: Payer: Self-pay | Admitting: Internal Medicine

## 2022-07-03 ENCOUNTER — Other Ambulatory Visit (HOSPITAL_COMMUNITY): Payer: Self-pay | Admitting: Cardiology

## 2022-07-03 ENCOUNTER — Other Ambulatory Visit: Payer: Self-pay | Admitting: Diagnostic Neuroimaging

## 2022-07-03 DIAGNOSIS — E1142 Type 2 diabetes mellitus with diabetic polyneuropathy: Secondary | ICD-10-CM

## 2022-07-04 DIAGNOSIS — Z79899 Other long term (current) drug therapy: Secondary | ICD-10-CM | POA: Diagnosis not present

## 2022-07-04 DIAGNOSIS — I428 Other cardiomyopathies: Secondary | ICD-10-CM | POA: Diagnosis not present

## 2022-07-04 DIAGNOSIS — I5022 Chronic systolic (congestive) heart failure: Secondary | ICD-10-CM | POA: Diagnosis not present

## 2022-07-11 ENCOUNTER — Other Ambulatory Visit: Payer: Self-pay

## 2022-07-16 ENCOUNTER — Telehealth: Payer: Self-pay | Admitting: Diagnostic Neuroimaging

## 2022-07-16 NOTE — Telephone Encounter (Signed)
Please address upon your return  Dr. Melvyn Novas, Cardiologist at [facility] called, pt is listed for heart transplant and he wanted to see if seizure medications can be adjusted due to contradictions with patient being started on immunosuppressants after transplant. He is aware Dr Leta Baptist is out of office at the moment and will wait for his return.  Pt is taking levetiracetam, oxcarbazepine and phenytoin  Phone: [phone] Email: [email or web address]

## 2022-07-17 ENCOUNTER — Ambulatory Visit: Payer: Medicaid Other | Attending: Cardiology

## 2022-07-17 DIAGNOSIS — Z7901 Long term (current) use of anticoagulants: Secondary | ICD-10-CM | POA: Insufficient documentation

## 2022-07-17 DIAGNOSIS — I639 Cerebral infarction, unspecified: Secondary | ICD-10-CM | POA: Diagnosis not present

## 2022-07-17 DIAGNOSIS — Z5181 Encounter for therapeutic drug level monitoring: Secondary | ICD-10-CM | POA: Diagnosis not present

## 2022-07-17 DIAGNOSIS — I513 Intracardiac thrombosis, not elsewhere classified: Secondary | ICD-10-CM | POA: Insufficient documentation

## 2022-07-17 LAB — POCT INR: INR: 2.3 (ref 2.0–3.0)

## 2022-07-17 NOTE — Telephone Encounter (Signed)
Pt returned call, Informed him Dr Leta Baptist spoke with Dr Mosetta Pigeon and he will plan to wean him off phenytoin over next 8 weeks (reduce to pheytoin 100mg  twice a day x 4 weeks, then 100mg  daily x 4 weeks, then stop). Temporary driving restriction for next 3 months during transition. He verbally understood Pt agreed to set up VV to further discuss 07/24/22 at 12pm. Advised to call the office back if that time didn't work for him due to having multiple appointments. Will also send pt MyChart message for reference.

## 2022-07-17 NOTE — Telephone Encounter (Signed)
Contacted pt, LVM rq call back  

## 2022-07-17 NOTE — Telephone Encounter (Signed)
I spoke with Dr. Mosetta Pigeon. We can plan to wean patient off phenytoin over next 8 weeks (reduce to pheytoin 100mg  twice a day x 4 weeks, then 100mg  daily x 4 weeks, then stop). Temporary driving restriction for next 3 months during transition.   Please contact patient. We can setup video visit to discuss further.   Penni Bombard, MD [id number], [id number] PM Certified in Neurology, Neurophysiology and Neuroimaging  [facility] [address], [place], [place] [address] [phone]

## 2022-07-17 NOTE — Patient Instructions (Addendum)
Description   Continue what you have been taking - warfarin 1.5 tablets daily except for 1 tablet on Mondays, Wednesday and Fridays.  Stay consistent with leafy veggies.  Recheck INR in 3 weeks.  Coumadin Clinic [phone]

## 2022-07-24 ENCOUNTER — Encounter: Payer: Self-pay | Admitting: Diagnostic Neuroimaging

## 2022-07-24 ENCOUNTER — Telehealth (INDEPENDENT_AMBULATORY_CARE_PROVIDER_SITE_OTHER): Payer: Medicaid Other | Admitting: Diagnostic Neuroimaging

## 2022-07-24 DIAGNOSIS — I63413 Cerebral infarction due to embolism of bilateral middle cerebral arteries: Secondary | ICD-10-CM | POA: Diagnosis not present

## 2022-07-24 DIAGNOSIS — G40909 Epilepsy, unspecified, not intractable, without status epilepticus: Secondary | ICD-10-CM

## 2022-07-24 NOTE — Progress Notes (Signed)
[facility]  PATIENT: Jason Stevenson DOB: 1985-12-30  REFERRING CLINICIAN: Marcine Matar, MD HISTORY FROM: patient  REASON FOR VISIT: follow up   HISTORICAL  CHIEF COMPLAINT:  Chief Complaint  Patient presents with   seizure disorder    HISTORY OF PRESENT ILLNESS:   UPDATE (07/24/22, VRP): Since last visit, doing about the same.  No more seizures.  Has been evaluated at [facility] for heart transplant consideration.  There anticipating immunosuppressive medication for heart transplant in the future, and therefore are requesting [place] to evaluate patient to consider weaning off of phenytoin which can have drug to drug interaction.    UPDATE (06/26/21, VRP): Since last visit, doing well, except for low BG seizure in Dec 2022. No other events.   PRIOR HPI (12/20/20, VRP): 37 year old male with diabetes, nonischemic cardiomyopathy, CHF, hypertension, hyperlipidemia, presented to hospital on 09/11/2020 for left-sided twitching.  Blood glucose was greater than 600.  MRI showed multiple right frontal and left parietal subacute hemorrhagic infarcts, and right frontotemporal gyriform diffusion abnormality possibly related to seizure related changes.  Patient was treated for seizure and stroke prevention.  He was discharged on Coumadin anticoagulation and Keppra, Dilantin, Trileptal. He was again admitted to the hospital in December 16, 2020 for syncope.   REVIEW OF SYSTEMS: Full 14 system review of systems performed and negative with exception of: as per HPI.  ALLERGIES: No Known Allergies  HOME MEDICATIONS: Outpatient Medications Prior to Visit  Medication Sig Dispense Refill   ACCU-CHEK GUIDE test strip USE TO CHECK BLOOD SUGAR THREE TIMES DAILY. 100 each 2   acetaminophen (TYLENOL) 325 MG tablet Take 1-2 tablets (325-650 mg total) by mouth every 4 (four) hours as needed for mild pain.     atorvastatin (LIPITOR) 80 MG tablet TAKE 1 TABLET (80 MG TOTAL) BY MOUTH DAILY  (EVENING) 90 tablet 1   Blood Glucose Monitoring Suppl (ACCU-CHEK GUIDE ME) w/Device KIT USE TO CHECK BLOOD SUGAR THREE TIMES DAILY. 1 kit 0   Blood Pressure Monitor DEVI Use as directed to check home blood pressure 2-3 times a week 1 Device 0   Continuous Blood Gluc Sensor (FREESTYLE LIBRE 2 SENSOR) MISC USE EVERY 14 (FOURTEEN) DAYS. 6 each 1   CORLANOR 5 MG TABS tablet TAKE 1 TABLET (5 MG TOTAL) BY MOUTH 2 (TWO) TIMES DAILY WITH A MEAL (AM+EVENING) 60 tablet 6   dapagliflozin propanediol (FARXIGA) 5 MG TABS tablet TAKE 1 TABLET (5 MG TOTAL) BY MOUTH DAILY BEFORE BREAKFAST. 30 tablet 11   dicyclomine (BENTYL) 20 MG tablet Take 1 tablet (20 mg total) by mouth 2 (two) times daily. 20 tablet 0   digoxin (LANOXIN) 0.125 MG tablet TAKE 1 TABLET (0.125 MG TOTAL) BY MOUTH DAILY (AM) 30 tablet 6   DULoxetine (CYMBALTA) 30 MG capsule Take 1 capsule (30 mg total) by mouth daily. 30 capsule 5   EASY COMFORT PEN NEEDLES 32G X 4 MM MISC USE TO INJECT INSULIN FOUR TIMES A DAY 200 each 2   Evolocumab (REPATHA SURECLICK) 140 MG/ML SOAJ Inject 140 mg into the skin every 14 (fourteen) days. 2 mL 11   ezetimibe (ZETIA) 10 MG tablet TAKE 1 TABLET (10 MG TOTAL) BY MOUTH DAILY (AM) 30 tablet 3   furosemide (LASIX) 20 MG tablet Take 1 tablet (20 mg total) by mouth 2 (two) times a week. Mondays Wednesdays and Friday (Patient taking differently: Take 20 mg by mouth 2 (two) times a week. Monday and Friday) 30 tablet 11   insulin  aspart (FIASP FLEXTOUCH) 100 UNIT/ML FlexTouch Pen Inject 5-8 Units into the skin 3 (three) times daily before meals. (Patient taking differently: Inject 5-10 Units into the skin 3 (three) times daily before meals. Sliding scale) 30 mL 3   Insulin Syringe-Needle U-100 (RELION INSULIN SYRINGE) 31G X 15/64" 0.3 ML MISC Use to inject insulin daily. 100 each 11   levETIRAcetam (KEPPRA) 750 MG tablet TAKE 2 TABLETS BY MOUTH 2 (TWO) TIMES DAILY (2AM+ 2EVENING) 360 tablet 1   loperamide (IMODIUM) 2 MG  capsule Take 1 capsule (2 mg total) by mouth 4 (four) times daily as needed for diarrhea or loose stools. 12 capsule 0   Menthol, Topical Analgesic, (ICY HOT BACK EX) Apply 1 application  topically daily as needed (neuropathy). Icy hot cream     metFORMIN (GLUCOPHAGE-XR) 500 MG 24 hr tablet TAKE 1 TABLET (500 MG TOTAL) BY MOUTH 2 (TWO) TIMES DAILY WITH A MEAL (AM+EVENING) 180 tablet 1   metoprolol (TOPROL-XL) 200 MG 24 hr tablet TAKE 0.5 TABLETS (100 MG TOTAL) BY MOUTH DAILY. TAKE WITH OR IMMEDIATELY FOLLOWING A MEAL (EVENING) 15 tablet 6   Oxcarbazepine (TRILEPTAL) 300 MG tablet TAKE 2 TABLETS (600 MG TOTAL) BY MOUTH 2 (TWO) TIMES DAILY (AM+EVENING) 360 tablet 1   phenytoin (DILANTIN) 100 MG ER capsule TAKE ONE CAPSULE BY MOUTH THREE TIMES DAILY (AM+NOON+BEDTIME) 270 capsule 1   potassium chloride SA (KLOR-CON M) 20 MEQ tablet Take 1 tablet (20 mEq total) by mouth 2 (two) times a week. Take every Mon, Wed and Fri with Lasix (Patient taking differently: Take 20 mEq by mouth 2 (two) times a week. Take every Mon,and Fri with Lasix) 30 tablet 4   pregabalin (LYRICA) 300 MG capsule Take 1 capsule (300 mg total) by mouth at bedtime. 30 capsule 5   sacubitril-valsartan (ENTRESTO) 49-51 MG Take 1 tablet by mouth 2 (two) times daily. 60 tablet 8   sildenafil (VIAGRA) 50 MG tablet 1 tab p.o. half hour prior to intercourse PRN.  Limit use to 1 tab/24 hr 10 tablet 6   spironolactone (ALDACTONE) 25 MG tablet TAKE 1 TABLET (25 MG TOTAL) BY MOUTH AT BEDTIME. 90 tablet 1   TRESIBA FLEXTOUCH 200 UNIT/ML FlexTouch Pen INJECT 20 UNITS INTO THE SKIN DAILY. 18 mL 3   warfarin (COUMADIN) 5 MG tablet TAKE 1 TO 1 & 1/2 TABLETS (5 TO 7.5 MG TOTAL) BY MOUTH DAILY. 40 tablet 3   No facility-administered medications prior to visit.    PAST MEDICAL HISTORY: Past Medical History:  Diagnosis Date   Chronic systolic CHF (congestive heart failure) (HCC) 06/18/2015   Hemorrhagic stroke (HCC)    Hyperlipidemia    Hypertension     Immune deficiency disorder (HCC)    Nonischemic cardiomyopathy (HCC) Noted as early as 2007   Per chart review (cards consult note 2011), EF of 40% in 2007, down to 20-25% in 2011    PAST SURGICAL HISTORY: Past Surgical History:  Procedure Laterality Date   ICD IMPLANT N/A 08/27/2021   Procedure: ICD IMPLANT;  Surgeon: Lanier PrudeLambert, Cameron T, MD;  [facility];  Service: Cardiovascular;  Laterality: N/A;   None     RIGHT HEART CATH  06/21/2021   [facility]   RIGHT HEART CATH N/A 05/17/2022   Procedure: RIGHT HEART CATH;  Surgeon: Laurey MoraleMcLean, Dalton S, MD;  [facility];  Service: Cardiovascular;  Laterality: N/A;   RIGHT/LEFT HEART CATH AND CORONARY ANGIOGRAPHY N/A 11/02/2020   Procedure: RIGHT/LEFT HEART  CATH AND CORONARY ANGIOGRAPHY;  Surgeon: Laurey Morale, MD;  Location: [facility] INVASIVE CV LAB;  Service: Cardiovascular;  Laterality: N/A;    FAMILY HISTORY: Family History  Problem Relation Age of Onset   Stroke Mother    Diabetes Mother    Hypertension Mother    Stroke Maternal Aunt    Heart attack Neg Hx     SOCIAL HISTORY: Social History   Socioeconomic History   Marital status: Married    Spouse name: [name]   Number of children: 0   Years of education: 13   Highest education level: Some college, no degree  Occupational History   Occupation: unemployed  Tobacco Use   Smoking status: Never   Smokeless tobacco: Never  Vaping Use   Vaping Use: Not on file  Substance and Sexual Activity   Alcohol use: Not Currently    Comment: "occasional" when "hanging out with the wrong people" No recent use.   Drug use: Not Currently    Types: Marijuana    Comment: occasional, last 2013   Sexual activity: Not Currently  Other Topics Concern   Not on file  Social History Narrative   Pt lives at home with his wife    Right handed    Caffeine- hardly any   Social Determinants of Health   Financial Resource Strain: High Risk (10/18/2021)    Overall Financial Resource Strain (CARDIA)    Difficulty of Paying Living Expenses: Hard  Food Insecurity: No Food Insecurity (09/14/2020)   Hunger Vital Sign    Worried About Running Out of Food in the Last Year: Never true    Ran Out of Food in the Last Year: Never true  Recent Concern: Food Insecurity - Food Insecurity Present (09/13/2020)   Hunger Vital Sign    Worried About Running Out of Food in the Last Year: Never true    Ran Out of Food in the Last Year: Sometimes true  Transportation Needs: Unmet Transportation Needs (12/15/2020)   PRAPARE - Administrator, Civil Service (Medical): No    Lack of Transportation (Non-Medical): Yes  Physical Activity: Not on file  Stress: Not on file  Social Connections: Not on file  Intimate Partner Violence: Not on file     PHYSICAL EXAM  Video visit    DIAGNOSTIC DATA (LABS, IMAGING, TESTING) - I reviewed patient records, labs, notes, testing and imaging myself where available.  Lab Results  Component Value Date   WBC 7.0 06/04/2022   HGB 13.9 06/04/2022   HCT 41.7 06/04/2022   MCV 88.9 06/04/2022   PLT 216 06/04/2022      Component Value Date/Time   NA 134 (L) 06/28/2022 1143   NA 142 08/03/2021 1313   K 4.9 06/28/2022 1143   CL 100 06/28/2022 1143   CO2 25 06/28/2022 1143   GLUCOSE 274 (H) 06/28/2022 1143   BUN 24 (H) 06/28/2022 1143   BUN 12 08/03/2021 1313   CREATININE 0.94 06/28/2022 1143   CREATININE 0.80 04/17/2016 1204   CALCIUM 8.6 (L) 06/28/2022 1143   PROT 7.1 03/19/2022 1638   PROT 7.2 11/26/2019 0858   ALBUMIN 3.9 03/19/2022 1638   ALBUMIN 4.6 11/26/2019 0858   AST 33 03/19/2022 1638   ALT 36 03/19/2022 1638   ALKPHOS 95 03/19/2022 1638   BILITOT 0.4 03/19/2022 1638   BILITOT 0.4 11/26/2019 0858   GFRNONAA >60 06/28/2022 1143   GFRNONAA >89 04/17/2016 1204   GFRAA 138 08/04/2019 0952   GFRAA >  89 04/17/2016 1204   Lab Results  Component Value Date   CHOL 175 04/30/2022   HDL 61 04/30/2022    LDLCALC 98 04/30/2022   TRIG 82 04/30/2022   CHOLHDL 2.9 04/30/2022   Lab Results  Component Value Date   HGBA1C 7.6 (A) 03/21/2022   Lab Results  Component Value Date   VITAMINB12 1,427 (H) 11/26/2019   Lab Results  Component Value Date   TSH 0.904 11/21/2021    09/11/20 MRI brain 1. Probable subacute hemorrhagic infarcts involving the anterior right frontal lobe and left parieto-occipital cortex as above, with additional chronic right parietal infarct. 2. Superimposed gyriform diffusion abnormality and enhancement involving the right frontotemporal region, favored to reflect changes of acute seizure. Correlation with EEG recommended. Possible ischemic change would be the primary differential consideration. 3. Abnormal symmetric T2/FLAIR signal abnormality involving the caudate and lentiform nuclei bilaterally, nonspecific, but most suggestive of superimposed/concomitant toxic metabolic derangement or other toxic insult. Correlation with history and laboratory values recommended. 4. A short interval follow-up brain MRI to ensure that these changes resolve is recommended.  09/21/20 MRI brain, MRA head / neck: 1. No acute intracranial abnormality. No significant change from prior MRI. 2. Increased T2 signal in the right frontal cortex, bilateral basal ganglia and radiating white matter tracts appear unchanged. Differential considerations remain the same including post ictal and metabolic derangement. 3. Unremarkable MR angiogram of the head and neck.    ASSESSMENT AND PLAN  37 y.o. year old male here with   Dx:  1. 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 Seizure disorder       PLAN:  SEIZURE DISORDER (due to stroke; last on 09/17/20; also Dec 2022 with low sugar) - continue levetiracetam 1500mg  twice a day, oxcarbazepine 600mg  twice a day  - plan to wean patient off phenytoin over next 8 weeks (reduce to  pheytoin 100mg  twice a day x 4 weeks, then 100mg  daily x 4 weeks, then stop). Temporary driving restriction for next 3 months during transition.   - close INR monitoring during transition; follow up with anti-coag clinic first before starting transition   STROKE PREVENTION (embolic; cardiomyopathy) - continue coumadin, DM, BP, lipid control   DIABETIC NEUROPATHY PAIN - continue lyrica 300mg  twice a day   Return in about 6 months (around 01/23/2023) for with NP (Amy Lomax), MyChart visit (15 min).  Virtual Visit via Video Note  I connected with [name] on 07/24/22 at 12:00 PM EDT by a video enabled telemedicine application and verified that I am speaking with the correct person using two identifiers.   I discussed the limitations of evaluation and management by telemedicine and the availability of in person appointments. The patient expressed understanding and agreed to proceed.  Patient is at home and I am at the office.   I spent 15 minutes of face-to-face and non-face-to-face time with patient.  This included previsit chart review, lab review, study review, order entry, electronic health record documentation, patient education.      Suanne Marker, MD 07/24/2022, 2:04 PM Certified in Neurology, Neurophysiology and Neuroimaging  [facility] [address] [place], [place] [address] [phone]

## 2022-07-24 NOTE — Patient Instructions (Addendum)
-   plan to wean patient off phenytoin over next 8 weeks (reduce to pheytoin 100mg  twice a day x 4 weeks, then 100mg  daily x 4 weeks, then stop).   - temporary driving restriction for next 3 months during transition.   - close INR monitoring during transition; follow up with anti-coagulation (coumadin) clinic first before starting transition

## 2022-07-29 ENCOUNTER — Ambulatory Visit (INDEPENDENT_AMBULATORY_CARE_PROVIDER_SITE_OTHER): Payer: Medicaid Other | Admitting: Internal Medicine

## 2022-07-29 ENCOUNTER — Encounter: Payer: Self-pay | Admitting: Internal Medicine

## 2022-07-29 VITALS — BP 114/70 | HR 68 | Ht 71.0 in | Wt 159.0 lb

## 2022-07-29 DIAGNOSIS — E785 Hyperlipidemia, unspecified: Secondary | ICD-10-CM | POA: Diagnosis not present

## 2022-07-29 DIAGNOSIS — E1369 Other specified diabetes mellitus with other specified complication: Secondary | ICD-10-CM | POA: Diagnosis not present

## 2022-07-29 DIAGNOSIS — E139 Other specified diabetes mellitus without complications: Secondary | ICD-10-CM

## 2022-07-29 DIAGNOSIS — E1169 Type 2 diabetes mellitus with other specified complication: Secondary | ICD-10-CM

## 2022-07-29 LAB — POCT GLYCOSYLATED HEMOGLOBIN (HGB A1C): Hemoglobin A1C: 9.2 % — AB (ref 4.0–5.6)

## 2022-07-29 NOTE — Addendum Note (Signed)
Addended by: Lisabeth Pick on: 07/29/2022 09:36 AM   Modules accepted: Orders

## 2022-07-29 NOTE — Patient Instructions (Addendum)
Please continue: - Metformin ER 500 mg 2x a day, with meals - Farxiga 5 mg before b'fast  - Tresiba 20 units daily  - FiAsp 6-10 units before each meal If you have a snack with >15g of carbs, you may need to take 3-4 units of Fiasp.  Please return in 4 months.

## 2022-07-29 NOTE — Progress Notes (Signed)
Patient ID: Jason Stevenson, male   DOB: Sep 09, 1985, 37 y.o.   MRN: [id number]  HPI: Jason Stevenson is a 37 y.o.-year-old male, initially referred by his cardiologist, Dr. Shirlee Latch, returning for follow-up for DM, dx in 2011, but as LADA in 2022, insulin-dependent, uncontrolled, with complications (ischemic cardiomyopathy complicated by systolic CHF, history of CVA -after Covid19 infection, peripheral neuropathy, ED).  Last visit 4 months ago.  Interim history: No increased urination, nausea, blurry vision, chest pain.  He still has dizziness, lightheadedness. He will soon start tapering down Dilantin under neurology supervision. Since last OV, he had a lot of stress including moving. He was eating out a lot - not bolusing before the meals.  Reviewed HbA1c levels: Lab Results  Component Value Date   HGBA1C 7.6 (A) 03/21/2022   HGBA1C 8.7 (A) 11/15/2021   HGBA1C 7.7 (H) 04/19/2021   HGBA1C 8.2 (H) 01/29/2021   HGBA1C 8.3 (H) 12/14/2020   HGBA1C >15.5 (H) 09/12/2020   HGBA1C >15.5 (H) 11/26/2019   HGBA1C 14.2 (A) 04/26/2019   HGBA1C 13.8 (A) 01/18/2019   HGBA1C 13.7 (A) 10/12/2018  06/18/2021: HbA1c 7.8%  Previously on: - Metformin 500 mg 2x a day, with meals - Farxiga 5 mg before b'fast - Novolin 70/30 30 min before a meal 22-25 units before breakfast 14-16 units before dinner   We changed to: - Metformin 500 mg 2x a day, with meals >> diarrhea >> metformin ER 500 mg 2x a day - Farxiga 5 mg before b'fast  - Tresiba 20 units daily  - FiAsp 5-8 >> 6-10 >> but using 5-8 units before each meal and 3 to 4 units before her evening snack >> 6-10 units before meals  Pt checks his sugars >4x a day with his Freestyle Libre 2 CGM (3 was not covered) -checking with the receiver as his phone is not compatible with the sensors after an update:  Previously:    Lowest sugar was 67 >> 54 >> 59; he has hypoglycemia awareness at 70.  Highest sugar was 400 >> 300s >>  300s  Glucometer:TrueMetrix  Pt's meals are: - Breakfast: fruit (including banana), PB, toast, pancakes - Lunch: pasta, stir-fry with protein, sandwiches - Dinner: varies - Snacks: fruit, sometimes chips  - no CKD, last BUN/creatinine:  Lab Results  Component Value Date   BUN 24 (H) 06/28/2022   BUN 14 06/04/2022   CREATININE 0.94 06/28/2022   CREATININE 0.86 06/04/2022  On Entresto 24/26.  -+ HL; last set of lipids: Lab Results  Component Value Date   CHOL 175 04/30/2022   HDL 61 04/30/2022   LDLCALC 98 04/30/2022   TRIG 82 04/30/2022   CHOLHDL 2.9 04/30/2022  On atorvastatin 80 mg daily, ezetimibe 10 mg daily. Also, Repatha.  - last eye exam was 01/01/2022. No DR. Now that he got insurance, he is planning to schedule this.  - + numbness and tingling in his feet. He was currently referred to a pain clinic and also started on Lyrica, Cymbalta.  Last foot exam 01/31/2022. He had ABIs on 06/22/2021: Normal.  Pt has FH of DM in mother and sister.  He also has HTN.  He was evaluated at [facility] for heart transplant. He was enrolled in their cardiac physical therapy program, to gain more strength before the transplant.  However, afterwards, he was advised that he may not need a heart transplant. However, he had more tests >> still needs to get on the transplant list. He was in the emergency room with  nausea/vomiting/diarrhea on 11/27 and 12/05.  Lipase was elevated on 03/19/2022, at 58.  ROS: + see HPI  Past Medical History:  Diagnosis Date   Chronic systolic CHF (congestive heart failure) 06/18/2015   Hemorrhagic stroke    Hyperlipidemia    Hypertension    Immune deficiency disorder    Nonischemic cardiomyopathy Noted as early as 2007   Per chart review (cards consult note 2011), EF of 40% in 2007, down to 20-25% in 2011   Past Surgical History:  Procedure Laterality Date   ICD IMPLANT N/A 08/27/2021   Procedure: ICD IMPLANT;  Surgeon: Lanier Prude, MD;  Location:  [facility] INVASIVE CV LAB;  Service: Cardiovascular;  Laterality: N/A;   None     RIGHT HEART CATH  06/21/2021   [facility]   RIGHT HEART CATH N/A 05/17/2022   Procedure: RIGHT HEART CATH;  Surgeon: Laurey Morale, MD;  Location: [facility] INVASIVE CV LAB;  Service: Cardiovascular;  Laterality: N/A;   RIGHT/LEFT HEART CATH AND CORONARY ANGIOGRAPHY N/A 11/02/2020   Procedure: RIGHT/LEFT HEART CATH AND CORONARY ANGIOGRAPHY;  Surgeon: Laurey Morale, MD;  Location: [facility] INVASIVE CV LAB;  Service: Cardiovascular;  Laterality: N/A;   Social History   Socioeconomic History   Marital status: Married    Spouse name: [name]   Number of children: 0   Years of education: 13   Highest education level: Some college, no degree  Occupational History   Occupation: unemployed  Tobacco Use   Smoking status: Never   Smokeless tobacco: Never  Vaping Use   Vaping Use: Not on file  Substance and Sexual Activity   Alcohol use: Not Currently    Comment: "occasional" when "hanging out with the wrong people" No recent use.   Drug use: Not Currently    Types: Marijuana    Comment: occasional, last 2013   Sexual activity: Not Currently  Other Topics Concern   Not on file  Social History Narrative   Pt lives at home with his wife    Right handed    Caffeine- hardly any   Social Determinants of Health   Financial Resource Strain: High Risk (10/18/2021)   Overall Financial Resource Strain (CARDIA)    Difficulty of Paying Living Expenses: Hard  Food Insecurity: No Food Insecurity (09/14/2020)   Hunger Vital Sign    Worried About Running Out of Food in the Last Year: Never true    Ran Out of Food in the Last Year: Never true  Recent Concern: Food Insecurity - Food Insecurity Present (09/13/2020)   Hunger Vital Sign    Worried About Running Out of Food in the Last Year: Never true    Ran Out of Food in the Last Year: Sometimes true  Transportation Needs: Unmet Transportation Needs (12/15/2020)   PRAPARE -  Administrator, Civil Service (Medical): No    Lack of Transportation (Non-Medical): Yes  Physical Activity: Not on file  Stress: Not on file  Social Connections: Not on file  Intimate Partner Violence: Not on file   Current Outpatient Medications on File Prior to Visit  Medication Sig Dispense Refill   ACCU-CHEK GUIDE test strip USE TO CHECK BLOOD SUGAR THREE TIMES DAILY. 100 each 2   acetaminophen (TYLENOL) 325 MG tablet Take 1-2 tablets (325-650 mg total) by mouth every 4 (four) hours as needed for mild pain.     atorvastatin (LIPITOR) 80 MG tablet TAKE 1 TABLET (80 MG TOTAL) BY MOUTH DAILY (EVENING) 90 tablet  1   Blood Glucose Monitoring Suppl (ACCU-CHEK GUIDE ME) w/Device KIT USE TO CHECK BLOOD SUGAR THREE TIMES DAILY. 1 kit 0   Blood Pressure Monitor DEVI Use as directed to check home blood pressure 2-3 times a week 1 Device 0   Continuous Blood Gluc Sensor (FREESTYLE LIBRE 2 SENSOR) MISC USE EVERY 14 (FOURTEEN) DAYS. 6 each 1   CORLANOR 5 MG TABS tablet TAKE 1 TABLET (5 MG TOTAL) BY MOUTH 2 (TWO) TIMES DAILY WITH A MEAL (AM+EVENING) 60 tablet 6   dapagliflozin propanediol (FARXIGA) 5 MG TABS tablet TAKE 1 TABLET (5 MG TOTAL) BY MOUTH DAILY BEFORE BREAKFAST. 30 tablet 11   dicyclomine (BENTYL) 20 MG tablet Take 1 tablet (20 mg total) by mouth 2 (two) times daily. 20 tablet 0   digoxin (LANOXIN) 0.125 MG tablet TAKE 1 TABLET (0.125 MG TOTAL) BY MOUTH DAILY (AM) 30 tablet 6   DULoxetine (CYMBALTA) 30 MG capsule Take 1 capsule (30 mg total) by mouth daily. 30 capsule 5   EASY COMFORT PEN NEEDLES 32G X 4 MM MISC USE TO INJECT INSULIN FOUR TIMES A DAY 200 each 2   Evolocumab (REPATHA SURECLICK) 140 MG/ML SOAJ Inject 140 mg into the skin every 14 (fourteen) days. 2 mL 11   ezetimibe (ZETIA) 10 MG tablet TAKE 1 TABLET (10 MG TOTAL) BY MOUTH DAILY (AM) 30 tablet 3   furosemide (LASIX) 20 MG tablet Take 1 tablet (20 mg total) by mouth 2 (two) times a week. Mondays Wednesdays and  Friday (Patient taking differently: Take 20 mg by mouth 2 (two) times a week. Monday and Friday) 30 tablet 11   insulin aspart (FIASP FLEXTOUCH) 100 UNIT/ML FlexTouch Pen Inject 5-8 Units into the skin 3 (three) times daily before meals. (Patient taking differently: Inject 5-10 Units into the skin 3 (three) times daily before meals. Sliding scale) 30 mL 3   Insulin Syringe-Needle U-100 (RELION INSULIN SYRINGE) 31G X 15/64" 0.3 ML MISC Use to inject insulin daily. 100 each 11   levETIRAcetam (KEPPRA) 750 MG tablet TAKE 2 TABLETS BY MOUTH 2 (TWO) TIMES DAILY (2AM+ 2EVENING) 360 tablet 1   loperamide (IMODIUM) 2 MG capsule Take 1 capsule (2 mg total) by mouth 4 (four) times daily as needed for diarrhea or loose stools. 12 capsule 0   Menthol, Topical Analgesic, (ICY HOT BACK EX) Apply 1 application  topically daily as needed (neuropathy). Icy hot cream     metFORMIN (GLUCOPHAGE-XR) 500 MG 24 hr tablet TAKE 1 TABLET (500 MG TOTAL) BY MOUTH 2 (TWO) TIMES DAILY WITH A MEAL (AM+EVENING) 180 tablet 1   metoprolol (TOPROL-XL) 200 MG 24 hr tablet TAKE 0.5 TABLETS (100 MG TOTAL) BY MOUTH DAILY. TAKE WITH OR IMMEDIATELY FOLLOWING A MEAL (EVENING) 15 tablet 6   Oxcarbazepine (TRILEPTAL) 300 MG tablet TAKE 2 TABLETS (600 MG TOTAL) BY MOUTH 2 (TWO) TIMES DAILY (AM+EVENING) 360 tablet 1   phenytoin (DILANTIN) 100 MG ER capsule TAKE ONE CAPSULE BY MOUTH THREE TIMES DAILY (AM+NOON+BEDTIME) 270 capsule 1   potassium chloride SA (KLOR-CON M) 20 MEQ tablet Take 1 tablet (20 mEq total) by mouth 2 (two) times a week. Take every Mon, Wed and Fri with Lasix (Patient taking differently: Take 20 mEq by mouth 2 (two) times a week. Take every Mon,and Fri with Lasix) 30 tablet 4   pregabalin (LYRICA) 300 MG capsule Take 1 capsule (300 mg total) by mouth at bedtime. 30 capsule 5   sacubitril-valsartan (ENTRESTO) 49-51 MG Take 1 tablet by  mouth 2 (two) times daily. 60 tablet 8   sildenafil (VIAGRA) 50 MG tablet 1 tab p.o. half hour  prior to intercourse PRN.  Limit use to 1 tab/24 hr 10 tablet 6   spironolactone (ALDACTONE) 25 MG tablet TAKE 1 TABLET (25 MG TOTAL) BY MOUTH AT BEDTIME. 90 tablet 1   TRESIBA FLEXTOUCH 200 UNIT/ML FlexTouch Pen INJECT 20 UNITS INTO THE SKIN DAILY. 18 mL 3   warfarin (COUMADIN) 5 MG tablet TAKE 1 TO 1 & 1/2 TABLETS (5 TO 7.5 MG TOTAL) BY MOUTH DAILY. 40 tablet 3   No current facility-administered medications on file prior to visit.   No Known Allergies Family History  Problem Relation Age of Onset   Stroke Mother    Diabetes Mother    Hypertension Mother    Stroke Maternal Aunt    Heart attack Neg Hx    PE: There were no vitals taken for this visit. Wt Readings from Last 3 Encounters:  06/28/22 155 lb 12.8 oz (70.7 kg)  06/03/22 161 lb 12.8 oz (73.4 kg)  05/17/22 156 lb (70.8 kg)   Constitutional: normal weight, in NAD Eyes: no exophthalmos ENT: no masses palpated in neck, no cervical lymphadenopathy Cardiovascular: RRR, No MRG Respiratory: CTA B Musculoskeletal: no deformities Skin:  no rashes Neurological: no tremor with outstretched hands  ASSESSMENT: 1. LADA, insulin-dependent, uncontrolled, with complications - iCMP, systolic CHF, with LV thrombus - on Coumadin - h/o CVA 08/2020 - PN - ED - on Viagra  Component     Latest Ref Rng & Units 02/05/2021  Glutamic Acid Decarb Ab     0.0 - 5.0 U/mL 20.6 (H)  Islet Cell Ab     Neg:<1:1 Negative   Component     Latest Ref Rng & Units 04/19/2021  C-Peptide     0.80 - 3.85 ng/mL 0.49 (L)  Glucose     65 - 99 mg/dL 78   His C-peptide is low, confirming insulin deficiency >> he has a diagnosis of LADA, behaving more like type 1 diabetes.  2. HL  PLAN:  1. Patient with longstanding, uncontrolled, insulin-dependent diabetes, on basal-bolus insulin regimen along with metformin and SGLT2 inhibitor with improved HbA1c at last visit, but still above goal (7.6%).  At that time, sugars appears to be higher especially after  meals but upon questioning, he had reduced his Fiasp doses missing doses especially when he was eating breakfast but occasionally before the other meals, also.  We discussed about the need to take the Fiasp every day and, based on the hyperglycemic spikes, I recommended to take slightly higher doses depending on the size and composition of his meals.  He agreed to do so.  Overnight, sugars were significantly improved since the previous visit after trying to eliminate his evening snack or eating peanuts instead of high carb snacks. CGM interpretation: -At today's visit, we reviewed his CGM downloads: It appears that 62% of values are in target range (goal >70%), while 37% are higher than 180 (goal <25%), and 1% are lower than 70 (goal <4%).  The calculated average blood sugar is 164.  The projected HbA1c for the next 3 months (GMI) is 7.2%. -Reviewing the CGM trends, in the last 2 weeks, sugars have been much better, compared to the previous 2 2-week periods.  He had a lot of stress with him having to move and finally moving in an apartment, eating frequently out and not taking his [place] with him.  We discussed that he  needs to do this and inject before every meal.  Also, he may need to increase the dose before larger meals or if eating out.  Otherwise, I feel that his regimen does not need to be changed since iIn the last 2 weeks, the predicted HbA1c 7.2%, much improved. - I suggested to:  Patient Instructions  Please continue: - Metformin ER 500 mg 2x a day, with meals - Farxiga 5 mg before b'fast  - Tresiba 20 units daily  - FiAsp 6-10 units before each meal If you have a snack with >15g of carbs, you may need to take 3-4 units of Fiasp.  Please return in 4 months.  - we checked his HbA1c: 9.2% (higher) - advised to check sugars at different times of the day - 4x a day, rotating check times - advised for yearly eye exams >> he is UTD - return to clinic in 4 months  2. HL -Reviewed latest lipid  panel from 3 months ago: Fractions at goal except for an elevated LDL, above our target of less than 55 due to cardiovascular disease: Lab Results  Component Value Date   CHOL 175 04/30/2022   HDL 61 04/30/2022   LDLCALC 98 04/30/2022   TRIG 82 04/30/2022   CHOLHDL 2.9 04/30/2022  -He continues on atorvastatin 80 mg daily, Zetia 10 mg daily, and Repatha, without side effects  Carlus Pavlov, MD PhD [facility]

## 2022-08-01 ENCOUNTER — Other Ambulatory Visit: Payer: Self-pay | Admitting: Internal Medicine

## 2022-08-01 ENCOUNTER — Other Ambulatory Visit (HOSPITAL_COMMUNITY): Payer: Self-pay | Admitting: Cardiology

## 2022-08-01 DIAGNOSIS — Z794 Long term (current) use of insulin: Secondary | ICD-10-CM

## 2022-08-02 ENCOUNTER — Ambulatory Visit: Payer: Medicaid Other | Attending: Cardiology | Admitting: *Deleted

## 2022-08-02 DIAGNOSIS — I513 Intracardiac thrombosis, not elsewhere classified: Secondary | ICD-10-CM | POA: Diagnosis not present

## 2022-08-02 DIAGNOSIS — Z7901 Long term (current) use of anticoagulants: Secondary | ICD-10-CM

## 2022-08-02 DIAGNOSIS — I639 Cerebral infarction, unspecified: Secondary | ICD-10-CM

## 2022-08-02 DIAGNOSIS — Z5181 Encounter for therapeutic drug level monitoring: Secondary | ICD-10-CM | POA: Diagnosis not present

## 2022-08-02 LAB — POCT INR: POC INR: 2.8

## 2022-08-02 NOTE — Patient Instructions (Signed)
Description   Continue taking warfarin 1.5 tablets daily except for 1 tablet on Mondays, Wednesday and Fridays. Recheck INR in 2 weeks. ( Pt is weaning dilantin dose over the next 8 weeks) Cou[facility]in [facility] [phone]

## 2022-08-07 DIAGNOSIS — I428 Other cardiomyopathies: Secondary | ICD-10-CM | POA: Diagnosis not present

## 2022-08-07 DIAGNOSIS — Z125 Encounter for screening for malignant neoplasm of prostate: Secondary | ICD-10-CM | POA: Diagnosis not present

## 2022-08-07 DIAGNOSIS — I5022 Chronic systolic (congestive) heart failure: Secondary | ICD-10-CM | POA: Diagnosis not present

## 2022-08-07 DIAGNOSIS — Z01810 Encounter for preprocedural cardiovascular examination: Secondary | ICD-10-CM | POA: Diagnosis not present

## 2022-08-07 DIAGNOSIS — I110 Hypertensive heart disease with heart failure: Secondary | ICD-10-CM | POA: Diagnosis not present

## 2022-08-07 DIAGNOSIS — Z113 Encounter for screening for infections with a predominantly sexual mode of transmission: Secondary | ICD-10-CM | POA: Diagnosis not present

## 2022-08-07 DIAGNOSIS — Z1159 Encounter for screening for other viral diseases: Secondary | ICD-10-CM | POA: Diagnosis not present

## 2022-08-08 ENCOUNTER — Encounter: Payer: Self-pay | Admitting: Internal Medicine

## 2022-08-08 ENCOUNTER — Ambulatory Visit: Payer: Medicaid Other | Attending: Internal Medicine | Admitting: Internal Medicine

## 2022-08-08 VITALS — BP 105/69 | HR 64 | Temp 97.7°F | Ht 71.0 in | Wt 160.0 lb

## 2022-08-08 DIAGNOSIS — I152 Hypertension secondary to endocrine disorders: Secondary | ICD-10-CM

## 2022-08-08 DIAGNOSIS — Z79899 Other long term (current) drug therapy: Secondary | ICD-10-CM | POA: Insufficient documentation

## 2022-08-08 DIAGNOSIS — I428 Other cardiomyopathies: Secondary | ICD-10-CM

## 2022-08-08 DIAGNOSIS — G40909 Epilepsy, unspecified, not intractable, without status epilepticus: Secondary | ICD-10-CM

## 2022-08-08 DIAGNOSIS — E1142 Type 2 diabetes mellitus with diabetic polyneuropathy: Secondary | ICD-10-CM | POA: Diagnosis not present

## 2022-08-08 DIAGNOSIS — R454 Irritability and anger: Secondary | ICD-10-CM

## 2022-08-08 DIAGNOSIS — Z86718 Personal history of other venous thrombosis and embolism: Secondary | ICD-10-CM | POA: Diagnosis not present

## 2022-08-08 DIAGNOSIS — Z7901 Long term (current) use of anticoagulants: Secondary | ICD-10-CM | POA: Insufficient documentation

## 2022-08-08 DIAGNOSIS — Z7984 Long term (current) use of oral hypoglycemic drugs: Secondary | ICD-10-CM | POA: Diagnosis not present

## 2022-08-08 DIAGNOSIS — E1159 Type 2 diabetes mellitus with other circulatory complications: Secondary | ICD-10-CM

## 2022-08-08 DIAGNOSIS — Z8673 Personal history of transient ischemic attack (TIA), and cerebral infarction without residual deficits: Secondary | ICD-10-CM | POA: Diagnosis not present

## 2022-08-08 DIAGNOSIS — E785 Hyperlipidemia, unspecified: Secondary | ICD-10-CM | POA: Insufficient documentation

## 2022-08-08 DIAGNOSIS — I110 Hypertensive heart disease with heart failure: Secondary | ICD-10-CM | POA: Insufficient documentation

## 2022-08-08 DIAGNOSIS — F320 Major depressive disorder, single episode, mild: Secondary | ICD-10-CM | POA: Diagnosis not present

## 2022-08-08 DIAGNOSIS — Z794 Long term (current) use of insulin: Secondary | ICD-10-CM | POA: Insufficient documentation

## 2022-08-08 DIAGNOSIS — I5022 Chronic systolic (congestive) heart failure: Secondary | ICD-10-CM | POA: Diagnosis not present

## 2022-08-08 NOTE — Progress Notes (Signed)
Patient ID: Jason Stevenson, male    DOB: 1986-03-24  MRN: [id number]  CC: Chronic disease management.   Subjective: Jason Stevenson is a 37 y.o. male who presents for chronic ds management His concerns today include:  Patient with history of DM with painful neuropathy, HTN, HL, NICM with systolic CHF EF <20%, ICD ( Cardiologist Dr. 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  Since last visit, he has seen his endocrinologist Dr. Elvera Lennox less than 2 weeks ago.  His A1c had worsened from 7.6 to 9.2.  He was continued on metformin ER 500 mg twice a day, Farxiga 5 mg daily, Tresiba 20 units daily and Fiasp 6-10 units with meals.  Encouraged to take his meal time insulin with meals consistently which he had not been doing.  Since then he has done well in taking his meal time insulin consistently  Seizure disorder: Saw Dr. Marjory Lies 07/24/2022.  He was continued on Keppra 1500 mg twice a day and Trileptal 600 mg twice a day.  He has started weaning him off phenytoin.  No recent sz  On last visit, he reported some marital stress, depression and problems controlling anger.  He is on Cymbalta.  We referred to behavioral health.  Referral sent to [facility].  Had 1st session already; he feels it went well and has f/u.  Cymbalta increased from 30 mg to 60 mg.  He and wife went to martial seminar at church which was helpful also.   HTN/NICM/CHF:  getting closer to getting on transplant list at [facility].  Had several recent visits with them. -on Lasix and K+ supplement 2 days a wk, Entresto 49/51 mg BID,  spironolactone 25 mg daily, Farxiga 5 mg daily, Toprol XL 100 mg daily, ivabradine 5 mg twice a day, digoxin 0.125 mg daily  No chest pains, shortness of breath or lower extremity edema at this time.  Patient Active Problem List   Diagnosis Date  Noted   Mild major depression 06/03/2022   Nonischemic cardiomyopathy 08/27/2021   LADA (latent autoimmune diabetes in adults), managed as type 2 03/28/2021   Poorly controlled type 2 diabetes mellitus with circulatory disorder 03/19/2021   Ischemic cardiomyopathy    Syncope 12/14/2020   Chest pain 10/30/2020   History of cerebrovascular accident (CVA) with residual deficit 10/27/2020   Acute CVA (cerebrovascular accident) 09/21/2020   Near syncope 09/21/2020   Encounter for monitoring Coumadin therapy 09/20/2020   LV (left ventricular) mural thrombus 09/13/2020   Seizure 09/13/2020   Neurological deficit present 09/12/2020   History of COVID-19 09/12/2020   Intracerebral hemorrhage 09/12/2020   Acute cerebrovascular accident (CVA) 09/12/2020   COVID-19 08/23/2020   Noncompliance with medication treatment due to intermittent use of medication 10/12/2018   Dyslipidemia 04/17/2016   Chronic systolic CHF (congestive heart failure) 06/18/2015   Needs flu shot 12/23/2013   Non-ischemic cardiomyopathy 11/06/2012   HTN (hypertension) 11/06/2012   HLD (hyperlipidemia) 11/06/2012     Current Outpatient Medications on File Prior to Visit  Medication Sig Dispense Refill   ACCU-CHEK GUIDE test strip USE TO CHECK BLOOD SUGAR THREE TIMES DAILY. 100 each 2   acetaminophen (TYLENOL) 325 MG tablet Take 1-2 tablets (325-650 mg total) by mouth every 4 (four) hours as needed for mild pain.     atorvastatin (LIPITOR) 80 MG tablet TAKE 1 TABLET (80 MG TOTAL) BY MOUTH  DAILY (EVENING) 90 tablet 1   Blood Glucose Monitoring Suppl (ACCU-CHEK GUIDE ME) w/Device KIT USE TO CHECK BLOOD SUGAR THREE TIMES DAILY. 1 kit 0   Blood Pressure Monitor DEVI Use as directed to check home blood pressure 2-3 times a week 1 Device 0   Continuous Blood Gluc Sensor (FREESTYLE LIBRE 2 SENSOR) MISC USE EVERY 14 (FOURTEEN) DAYS. 6 each 1   CORLANOR 5 MG TABS tablet TAKE 1 TABLET (5 MG TOTAL) BY MOUTH 2 (TWO) TIMES DAILY WITH A  MEAL (AM+EVENING) 60 tablet 6   dapagliflozin propanediol (FARXIGA) 5 MG TABS tablet TAKE 1 TABLET (5 MG TOTAL) BY MOUTH DAILY BEFORE BREAKFAST. 30 tablet 11   digoxin (LANOXIN) 0.125 MG tablet TAKE 1 TABLET (0.125 MG TOTAL) BY MOUTH DAILY (AM) 30 tablet 6   DULoxetine (CYMBALTA) 30 MG capsule TAKE 1 CAPSULE (30 MG TOTAL) BY MOUTH DAILY. ( AM ) (Patient taking differently: Take 60 mg by mouth daily.) 90 capsule 0   EASY COMFORT PEN NEEDLES 32G X 4 MM MISC USE TO INJECT INSULIN FOUR TIMES A DAY 200 each 2   Evolocumab (REPATHA SURECLICK) 140 MG/ML SOAJ Inject 140 mg into the skin every 14 (fourteen) days. 2 mL 11   ezetimibe (ZETIA) 10 MG tablet TAKE 1 TABLET (10 MG TOTAL) BY MOUTH DAILY (AM) 90 tablet 3   furosemide (LASIX) 20 MG tablet Take 1 tablet (20 mg total) by mouth 2 (two) times a week. Mondays Wednesdays and Friday (Patient taking differently: Take 20 mg by mouth 2 (two) times a week. Monday and Friday) 30 tablet 11   insulin aspart (FIASP FLEXTOUCH) 100 UNIT/ML FlexTouch Pen Inject 5-8 Units into the skin 3 (three) times daily before meals. (Patient taking differently: Inject 5-10 Units into the skin 3 (three) times daily before meals. Sliding scale) 30 mL 3   Insulin Syringe-Needle U-100 (RELION INSULIN SYRINGE) 31G X 15/64" 0.3 ML MISC Use to inject insulin daily. 100 each 11   levETIRAcetam (KEPPRA) 750 MG tablet TAKE 2 TABLETS BY MOUTH 2 (TWO) TIMES DAILY (2AM+ 2EVENING) 360 tablet 1   loperamide (IMODIUM) 2 MG capsule Take 1 capsule (2 mg total) by mouth 4 (four) times daily as needed for diarrhea or loose stools. 12 capsule 0   Menthol, Topical Analgesic, (ICY HOT BACK EX) Apply 1 application  topically daily as needed (neuropathy). Icy hot cream     metFORMIN (GLUCOPHAGE-XR) 500 MG 24 hr tablet TAKE 1 TABLET (500 MG TOTAL) BY MOUTH 2 (TWO) TIMES DAILY WITH A MEAL (AM+EVENING) 180 tablet 1   metoprolol (TOPROL-XL) 200 MG 24 hr tablet TAKE 0.5 TABLETS (100 MG TOTAL) BY MOUTH DAILY. TAKE  WITH OR IMMEDIATELY FOLLOWING A MEAL (EVENING) 15 tablet 6   Oxcarbazepine (TRILEPTAL) 300 MG tablet TAKE 2 TABLETS (600 MG TOTAL) BY MOUTH 2 (TWO) TIMES DAILY (AM+EVENING) 360 tablet 1   phenytoin (DILANTIN) 100 MG ER capsule TAKE ONE CAPSULE BY MOUTH THREE TIMES DAILY (AM+NOON+BEDTIME) 270 capsule 1   potassium chloride SA (KLOR-CON M) 20 MEQ tablet Take 1 tablet (20 mEq total) by mouth 2 (two) times a week. Take every Mon, Wed and Fri with Lasix (Patient taking differently: Take 20 mEq by mouth 2 (two) times a week. Take every Mon,and Fri with Lasix) 30 tablet 4   pregabalin (LYRICA) 300 MG capsule Take 1 capsule (300 mg total) by mouth at bedtime. 30 capsule 5   sacubitril-valsartan (ENTRESTO) 49-51 MG Take 1 tablet by mouth 2 (two) times daily.  60 tablet 8   sildenafil (VIAGRA) 50 MG tablet 1 tab p.o. half hour prior to intercourse PRN.  Limit use to 1 tab/24 hr 10 tablet 6   spironolactone (ALDACTONE) 25 MG tablet TAKE 1 TABLET (25 MG TOTAL) BY MOUTH AT BEDTIME. 90 tablet 1   TRESIBA FLEXTOUCH 200 UNIT/ML FlexTouch Pen INJECT 20 UNITS INTO THE SKIN DAILY. 18 mL 3   warfarin (COUMADIN) 5 MG tablet TAKE 1 TO 1 & 1/2 TABLETS (5 TO 7.5 MG TOTAL) BY MOUTH DAILY. 40 tablet 3   dicyclomine (BENTYL) 20 MG tablet Take 1 tablet (20 mg total) by mouth 2 (two) times daily. (Patient not taking: Reported on 08/08/2022) 20 tablet 0   No current facility-administered medications on file prior to visit.    No Known Allergies  Social History   Socioeconomic History   Marital status: Married    Spouse name: [name]   Number of children: 0   Years of education: 13   Highest education level: Some college, no degree  Occupational History   Occupation: unemployed  Tobacco Use   Smoking status: Never   Smokeless tobacco: Never  Vaping Use   Vaping Use: Not on file  Substance and Sexual Activity   Alcohol use: Not Currently    Comment: "occasional" when "hanging out with the wrong people" No recent use.    Drug use: Not Currently    Types: Marijuana    Comment: occasional, last 2013   Sexual activity: Not Currently  Other Topics Concern   Not on file  Social History Narrative   Pt lives at home with his wife    Right handed    Caffeine- hardly any   Social Determinants of Health   Financial Resource Strain: High Risk (10/18/2021)   Overall Financial Resource Strain (CARDIA)    Difficulty of Paying Living Expenses: Hard  Food Insecurity: No Food Insecurity (09/14/2020)   Hunger Vital Sign    Worried About Running Out of Food in the Last Year: Never true    Ran Out of Food in the Last Year: Never true  Recent Concern: Food Insecurity - Food Insecurity Present (09/13/2020)   Hunger Vital Sign    Worried About Running Out of Food in the Last Year: Never true    Ran Out of Food in the Last Year: Sometimes true  Transportation Needs: Unmet Transportation Needs (12/15/2020)   PRAPARE - Administrator, Civil Service (Medical): No    Lack of Transportation (Non-Medical): Yes  Physical Activity: Not on file  Stress: Not on file  Social Connections: Not on file  Intimate Partner Violence: Not on file    Family History  Problem Relation Age of Onset   Stroke Mother    Diabetes Mother    Hypertension Mother    Stroke Maternal Aunt    Heart attack Neg Hx     Past Surgical History:  Procedure Laterality Date   ICD IMPLANT N/A 08/27/2021   Procedure: ICD IMPLANT;  Surgeon: Lanier Prude, MD;  Location: [facility] INVASIVE CV LAB;  Service: Cardiovascular;  Laterality: N/A;   None     RIGHT HEART CATH  06/21/2021   [facility]   RIGHT HEART CATH N/A 05/17/2022   Procedure: RIGHT HEART CATH;  Surgeon: Laurey Morale, MD;  Location: [facility] INVASIVE CV LAB;  Service: Cardiovascular;  Laterality: N/A;   RIGHT/LEFT HEART CATH AND CORONARY ANGIOGRAPHY N/A 11/02/2020   Procedure: RIGHT/LEFT HEART CATH AND CORONARY ANGIOGRAPHY;  Surgeon: Laurey Morale, MD;  Location: [facility] INVASIVE CV LAB;   Service: Cardiovascular;  Laterality: N/A;    ROS: Review of Systems Negative except as stated above  PHYSICAL EXAM: BP 105/69 (BP Location: Left Arm, Patient Position: Sitting, Cuff Size: Normal)   Pulse 64   Temp 97.7 F (36.5 C) (Oral)   Ht 5\' 11"  (1.803 m)   Wt 160 lb (72.6 kg)   BMI 22.32 kg/m   Physical Exam   General appearance - alert, well appearing, middle age AAM and in no distress Mental status - normal mood, behavior, speech, dress, motor activity, and thought processes Chest - clear to auscultation, no wheezes, rales or rhonchi, symmetric air entry Heart - normal rate, regular rhythm, normal S1, S2, no murmurs, rubs, clicks or gallops Extremities - peripheral pulses normal, no pedal edema, no clubbing or cyanosis     Latest Ref Rng & Units 06/28/2022   11:43 AM 06/04/2022   10:41 PM 05/17/2022    7:56 AM  CMP  Glucose 70 - 99 mg/dL 564  332    BUN 6 - 20 mg/dL 24  14    Creatinine 9.51 - 1.24 mg/dL 8.84  1.66    Sodium 063 - 145 mmol/L 134  140  143    144   Potassium 3.5 - 5.1 mmol/L 4.9  4.1  3.9    3.8   Chloride 98 - 111 mmol/L 100  102    CO2 22 - 32 mmol/L 25  28    Calcium 8.9 - 10.3 mg/dL 8.6  9.0     Lipid Panel     Component Value Date/Time   CHOL 175 04/30/2022 1106   CHOL 107 06/08/2021 0851   TRIG 82 04/30/2022 1106   HDL 61 04/30/2022 1106   HDL 49 06/08/2021 0851   CHOLHDL 2.9 04/30/2022 1106   VLDL 16 04/30/2022 1106   LDLCALC 98 04/30/2022 1106   LDLCALC 31 06/08/2021 0851    CBC    Component Value Date/Time   WBC 7.0 06/04/2022 2241   RBC 4.69 06/04/2022 2241   HGB 13.9 06/04/2022 2241   HGB 13.7 08/03/2021 1313   HCT 41.7 06/04/2022 2241   HCT 40.0 08/03/2021 1313   PLT 216 06/04/2022 2241   PLT 275 08/03/2021 1313   MCV 88.9 06/04/2022 2241   MCV 83 08/03/2021 1313   MCH 29.6 06/04/2022 2241   MCHC 33.3 06/04/2022 2241   RDW 11.9 06/04/2022 2241   RDW 11.8 08/03/2021 1313   LYMPHSABS 2.2 06/04/2022 2241    LYMPHSABS 2.0 08/03/2021 1313   MONOABS 0.4 06/04/2022 2241   EOSABS 0.1 06/04/2022 2241   EOSABS 0.1 08/03/2021 1313   BASOSABS 0.1 06/04/2022 2241   BASOSABS 0.1 08/03/2021 1313    ASSESSMENT AND PLAN: 1. Type 2 diabetes mellitus with diabetic polyneuropathy, with long-term current use of insulin Recent A1C not at goal. Pt to continue meds as prescribed by Dr. Brooks Sailors Encourage healthy eating habits  2. Hypertension associated with diabetes 3. Chronic systolic congestive heart failure At goal and compensated on meds listed above.  Hoping to get on transplant list at [facility]  4. NICM (nonischemic cardiomyopathy) Continue Entresto, Lasix, Spironolactone  5. Seizure disorder Followed by neurology.  No recent sz.  Being weaned off Dilantin.  Continuing with Trileptal and Keppra  6. Mild major depression 7. Difficulty controlling anger Doing better on Cymbalta.  He has been plugged in with behavioral health services.    Patient  was given the opportunity to ask questions.  Patient verbalized understanding of the plan and was able to repeat key elements of the plan.   This documentation was completed using Paediatric nurse.  Any transcriptional errors are unintentional.  No orders of the defined types were placed in this encounter.    Requested Prescriptions    No prescriptions requested or ordered in this encounter    No follow-ups on file.  [name], MD, FACP

## 2022-08-11 ENCOUNTER — Encounter (HOSPITAL_COMMUNITY): Payer: Self-pay | Admitting: Cardiology

## 2022-08-12 DIAGNOSIS — Z7682 Awaiting organ transplant status: Secondary | ICD-10-CM | POA: Diagnosis not present

## 2022-08-13 ENCOUNTER — Telehealth (HOSPITAL_COMMUNITY): Payer: Self-pay

## 2022-08-13 NOTE — Telephone Encounter (Signed)
I followed up with patient after sending a message about chest tightness.  He states today, no chest pain or discomfort. He denied any other symptoms including shortness of breath, dizziness, radiating pain. He thinks it is related to a muscle pull. I advised him with chest pain in the future to be sure and either call our office or go to ED to be evaluated. He agreed.

## 2022-08-14 ENCOUNTER — Encounter: Payer: Self-pay | Admitting: Internal Medicine

## 2022-08-16 ENCOUNTER — Ambulatory Visit: Payer: Medicaid Other | Attending: Cardiology | Admitting: *Deleted

## 2022-08-16 ENCOUNTER — Other Ambulatory Visit: Payer: Self-pay | Admitting: Family Medicine

## 2022-08-16 DIAGNOSIS — Z5181 Encounter for therapeutic drug level monitoring: Secondary | ICD-10-CM | POA: Diagnosis not present

## 2022-08-16 DIAGNOSIS — I513 Intracardiac thrombosis, not elsewhere classified: Secondary | ICD-10-CM | POA: Insufficient documentation

## 2022-08-16 DIAGNOSIS — R972 Elevated prostate specific antigen [PSA]: Secondary | ICD-10-CM

## 2022-08-16 DIAGNOSIS — I639 Cerebral infarction, unspecified: Secondary | ICD-10-CM | POA: Diagnosis not present

## 2022-08-16 DIAGNOSIS — Z7901 Long term (current) use of anticoagulants: Secondary | ICD-10-CM | POA: Insufficient documentation

## 2022-08-16 LAB — POCT INR: POC INR: 2.5

## 2022-08-16 NOTE — Patient Instructions (Signed)
Description   Continue taking warfarin 1.5 tablets daily except for 1 tablet on Mondays, Wednesday and Fridays. Recheck INR in 4 weeks. ( Pt's dilantin dose will be decreasing over the next few weeks) [facility] [phone]

## 2022-08-22 DIAGNOSIS — I5022 Chronic systolic (congestive) heart failure: Secondary | ICD-10-CM | POA: Diagnosis not present

## 2022-08-29 ENCOUNTER — Ambulatory Visit (INDEPENDENT_AMBULATORY_CARE_PROVIDER_SITE_OTHER): Payer: Medicaid Other

## 2022-08-29 DIAGNOSIS — I639 Cerebral infarction, unspecified: Secondary | ICD-10-CM

## 2022-08-29 LAB — CUP PACEART REMOTE DEVICE CHECK
Battery Remaining Longevity: 127 mo
Battery Voltage: 3.03 V
Brady Statistic RV Percent Paced: 0 %
Date Time Interrogation Session: 20240516043724
HighPow Impedance: 64 Ohm
Implantable Lead Connection Status: 753985
Implantable Lead Implant Date: 20230515
Implantable Lead Location: 753860
Implantable Pulse Generator Implant Date: 20230515
Lead Channel Impedance Value: 380 Ohm
Lead Channel Impedance Value: 456 Ohm
Lead Channel Pacing Threshold Amplitude: 0.625 V
Lead Channel Pacing Threshold Pulse Width: 0.4 ms
Lead Channel Sensing Intrinsic Amplitude: 4 mV
Lead Channel Sensing Intrinsic Amplitude: 4 mV
Lead Channel Setting Pacing Amplitude: 1 V
Lead Channel Setting Pacing Pulse Width: 0.4 ms
Lead Channel Setting Sensing Sensitivity: 0.3 mV
Zone Setting Status: 755011
Zone Setting Status: 755011

## 2022-08-30 ENCOUNTER — Other Ambulatory Visit: Payer: Self-pay | Admitting: Internal Medicine

## 2022-08-30 DIAGNOSIS — E1142 Type 2 diabetes mellitus with diabetic polyneuropathy: Secondary | ICD-10-CM

## 2022-08-30 DIAGNOSIS — R972 Elevated prostate specific antigen [PSA]: Secondary | ICD-10-CM | POA: Diagnosis not present

## 2022-08-30 DIAGNOSIS — N50812 Left testicular pain: Secondary | ICD-10-CM | POA: Diagnosis not present

## 2022-09-02 ENCOUNTER — Encounter (HOSPITAL_COMMUNITY): Payer: Self-pay | Admitting: Cardiology

## 2022-09-02 ENCOUNTER — Encounter: Payer: Self-pay | Admitting: Internal Medicine

## 2022-09-06 ENCOUNTER — Encounter (HOSPITAL_COMMUNITY): Payer: Medicaid Other | Admitting: Cardiology

## 2022-09-06 ENCOUNTER — Encounter (HOSPITAL_COMMUNITY): Payer: Self-pay

## 2022-09-10 NOTE — Progress Notes (Incomplete)
PCP: Dr. [name] Primary Cardiologist: Dr. Shirlee Latch  HPI: 37 y.o.male with diabetes, HTN, and a long history of nonischemic cardiomyopathy. He had a cardiac MRI in 1/08 showing low EF, but he says that he had been told about "heart problems" even prior to that.  He does not have a family history of cardiomyopathy that he knows of, but does not know his father's family.  Coronary CT angiogram in 2014 showed no CAD. Echo in 2/17 showed EF 20-25%, similar to 2014. Had repeat echo in 2020 showing LVEF 15-20%, RV normal. He had COVID-19 in 4/22.   He presented to [facility] ED 09/11/20 with tingling in left arm, left face, drooling and blank stare for about 1 week, and admitted for possible seizure, HHS and possible CVA.  In ED, he had left-sided neurologic deficit with dysarthria.  CT head with probable subacute infarct in right frontal lobe and old right parietal infarct.  Reportedly, patient's neurologic deficits resolved in ED, and focus shifted to possible seizure versus stroke.  He was loaded with Keppra.  MRI brain with probable hemorrhagic infarct involving the anterior right frontal lobe and left parietal occipital cortex with additional chronic right parietal infarct, right frontotemporal region enhancement suggesting changes related to acute seizure, possible toxic insult involving the caudate and lentiform nuclei bilaterally.  Neurology recommended interval follow-up brain MRI.  CTA head and neck without significant finding.  TTE with EF of 10-15%, LV thrombus, moderate RV dysfunction and dilated IVC.  He was discharged on GDMT, but A1c too high for SGLT2i.    Seen in [facility] ED 09/21/20 for fall after syncopal episode on toilet, hitting his head. CT of the head was significant for no bleeding but new low-density left cerebellum suggesting acute infarct. EEG unremarkable and felt fall secondary to vagal episode not seizure.  He had atypical chest pain at his hospital follow up. With his history of HLD and DM,  decisions was made to undergo [facility] to evaluate coronary anatomy and hemodynamics.  R/LHC (7/22) showed normal filling pressures, preserved cardiac output and no significant CAD. RA mean 1, RV 28/1, PA 19/7 mean 12, PCWP 3, CO/CI 5.54/2.92.  Again admitted 12/14/20-12/16/20 for syncopal episode. CT head negative. He was given IVF, beta blocker/spiro/Entresto held initially. Suspected orthostatic vs micturition syncope. EP consulted with known low EF and plans for ICD implant when a1c better controlled.  CPX in 9/22 showed severe HF limitation. Echo was done today and reviewed, EF < 20% with mild LV dilation, no LV thrombus noted, normal RV size with mildly decreased systolic function, IVC normal.   Followup 11/22, weight up about 10 lbs.  Patient had a seizure thought to be related to hypoglycemia (paramedics called, improved with glucose). Lasix 20 mg started every other day, eventually increased to daily.   RHC 3/23 at [facility] as part of pre-transplant work up showed normal pressures, RA 7, RV 23/4, PA 14, PCWP 11, CI 3.5.  Patient is currently being worked up for transplant listing.     Medtronic ICD placed 5/23.   Echo 1/24, EF < 20% with global hypokinesis, normal RV size and systolic function.   RHC (2/24) RA 5, PA 23/12 (mean 14), PCWP mean 8, CO/CI (Fick) 4.7/2.7  CPX (2/24) peak VO2 13.2, VE/VCO2 slope 19, RER 1.14 => severe HF limitation.   Today he returns for HF follow up. Overall feeling fine. He has had some dizziness, he attributes this to his recently increased Cymbalta dose. He has SOB walking up  steps, but does OK walking on flat ground and with ADLs. Denies palpitations, abnormal bleeding, CP, edema, or PND/Orthopnea. Appetite ok, trying to get more protein in diet. No fever or chills. Weight at home stable. Taking all medications. He is married, wife works from home.   Device interrogation (personally reviewed): OptiVol stable, thoracic impedence stable, no AF or VT, 0.7 hr/day  activity, < 0.1% V pacing.  Labs (9/22): K 3.5, creatinine 0.85, digoxin 0.4 Labs (10/22): K 4.1, creatinine 0.8, digoxin 0.9 Labs (3/23): K 4.5, creatinine 0.9 Labs (4/23): K 4.6, creatinine 0.89 Labs (12/23): hgb A1c 7.6, K 3.8, creatinine 0.94 Labs (1/24): LDL 98 Labs (2/24): K 4.1, creatinine 0.86 Labs (4/24): K 3.9, creatinine 0.8, LDL 109, A1C 9.2  ECG (personally reviewed): none ordered today.  ROS: All systems negative except as listed in HPI, PMH and Problem List.  PMH:  1. Type 1 diabetes: Generally poorly controlled.  2. 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  3. H/o LV thrombus 4. Syncope: Vagal vs orthostatic, likely not arrhythmic.  5. Diabetic polyneuropathy.  6. Chronic systolic CHF: Nonischemic cardiomyopathy.  Long-standing.  - Cardiac MRI (1/08): EF 45%, no late gadolinium enhancement.  - Coronary CTA (7/14): No significant CAD - Echo (7/14): EF 20-25%, severe LV dilation.  - Echo (6/22): EF 10-15%, LV thrombus, mild LV dilation, moderately decreased RV systolic function with normal size, mild MR, dilated IVC.  - LHC/RHC (7/22): No CAD; mean RA 1, PA 19/7, mean PCWP 3, CI 2.92.  - Echo (10/22): EF < 20%, mild LV dilation, normal RV size with mildly decreased systolic function, IVC normal.  - CPX (9/22): peak VO2 10.4, VE/VCO2 slope 26, RER 1.22 => severe HF limitation.  - RHC (3/23) at [facility] RA 7, RV 23/4, PA 14, PCWP 11, CI 3.5 - Medtronic ICD placed 5/23.  - Echo (1/24): EF < 20% with global hypokinesis, normal RV size and systolic function.  - RHC (2/24): RA 5, PA 23/12 (mean 14), PCWP mean 8, CO/CI (Fick) 4.7/2.7 - CPX (2/24): peak VO2 13.2, VE/VCO2 slope 19, RER 1.14 => severe HF limitation.  7. 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   SH:  Social History   Socioeconomic History   Marital status: Married    Spouse name: [name]   Number of children: 0   Years of education: 13   Highest education level: Some  college, no degree  Occupational History   Occupation: unemployed  Tobacco Use   Smoking status: Never   Smokeless tobacco: Never  Vaping Use   Vaping Use: Not on file  Substance and Sexual Activity   Alcohol use: Not Currently    Comment: "occasional" when "hanging out with the wrong people" No recent use.   Drug use: Not Currently    Types: Marijuana    Comment: occasional, last 2013   Sexual activity: Not Currently  Other Topics Concern   Not on file  Social History Narrative   Pt lives at home with his wife    Right handed    Caffeine- hardly any   Social Determinants of Health   Financial Resource Strain: High Risk (10/18/2021)   Overall Financial Resource Strain (CARDIA)    Difficulty of Paying Living Expenses: Hard  Food Insecurity: No Food Insecurity (09/14/2020)   Hunger Vital Sign    Worried About Running Out of Food in the Last Year: Never true    Ran Out of Food in the Last Year:  Never true  Recent Concern: Food Insecurity - Food Insecurity Present (09/13/2020)   Hunger Vital Sign    Worried About Running Out of Food in the Last Year: Never true    Ran Out of Food in the Last Year: Sometimes true  Transportation Needs: Unmet Transportation Needs (12/15/2020)   PRAPARE - Administrator, Civil Service (Medical): No    Lack of Transportation (Non-Medical): Yes  Physical Activity: Not on file  Stress: Not on file  Social Connections: Not on file  Intimate Partner Violence: Not on file   FH:  Family History  Problem Relation Age of Onset   Stroke Mother    Diabetes Mother    Hypertension Mother    Stroke Maternal Aunt    Heart attack Neg Hx     Current Outpatient Medications  Medication Sig Dispense Refill   ACCU-CHEK GUIDE test strip USE TO CHECK BLOOD SUGAR THREE TIMES DAILY. 100 each 2   acetaminophen (TYLENOL) 325 MG tablet Take 1-2 tablets (325-650 mg total) by mouth every 4 (four) hours as needed for mild pain.     atorvastatin (LIPITOR) 80 MG  tablet TAKE 1 TABLET (80 MG TOTAL) BY MOUTH DAILY (EVENING) 90 tablet 1   Blood Glucose Monitoring Suppl (ACCU-CHEK GUIDE ME) w/Device KIT USE TO CHECK BLOOD SUGAR THREE TIMES DAILY. 1 kit 0   Continuous Blood Gluc Sensor (FREESTYLE LIBRE 2 SENSOR) MISC USE EVERY 14 (FOURTEEN) DAYS. 6 each 1   CORLANOR 5 MG TABS tablet TAKE 1 TABLET (5 MG TOTAL) BY MOUTH 2 (TWO) TIMES DAILY WITH A MEAL (AM+EVENING) 60 tablet 6   dapagliflozin propanediol (FARXIGA) 5 MG TABS tablet TAKE 1 TABLET (5 MG TOTAL) BY MOUTH DAILY BEFORE BREAKFAST. 30 tablet 11   digoxin (LANOXIN) 0.125 MG tablet TAKE 1 TABLET (0.125 MG TOTAL) BY MOUTH DAILY (AM) 30 tablet 6   DULoxetine (CYMBALTA) 30 MG capsule Take 60 mg by mouth daily. Dose increase by psychiatry     EASY COMFORT PEN NEEDLES 32G X 4 MM MISC USE TO INJECT INSULIN FOUR TIMES A DAY 200 each 2   Evolocumab (REPATHA SURECLICK) 140 MG/ML SOAJ Inject 140 mg into the skin every 14 (fourteen) days. 2 mL 11   ezetimibe (ZETIA) 10 MG tablet TAKE 1 TABLET (10 MG TOTAL) BY MOUTH DAILY (AM) 90 tablet 3   furosemide (LASIX) 20 MG tablet Take 1 tablet (20 mg total) by mouth 2 (two) times a week. Mondays Wednesdays and Friday (Patient taking differently: Take 20 mg by mouth 2 (two) times a week. Monday and Friday) 30 tablet 11   insulin aspart (FIASP FLEXTOUCH) 100 UNIT/ML FlexTouch Pen Inject 5-8 Units into the skin 3 (three) times daily before meals. (Patient taking differently: Inject 5-10 Units into the skin 3 (three) times daily before meals. Sliding scale) 30 mL 3   Insulin Syringe-Needle U-100 (RELION INSULIN SYRINGE) 31G X 15/64" 0.3 ML MISC Use to inject insulin daily. 100 each 11   levETIRAcetam (KEPPRA) 750 MG tablet TAKE 2 TABLETS BY MOUTH 2 (TWO) TIMES DAILY (2AM+ 2EVENING) 360 tablet 1   loperamide (IMODIUM) 2 MG capsule Take 1 capsule (2 mg total) by mouth 4 (four) times daily as needed for diarrhea or loose stools. 12 capsule 0   Menthol, Topical Analgesic, (ICY HOT BACK EX)  Apply 1 application  topically daily as needed (neuropathy). Icy hot cream     metFORMIN (GLUCOPHAGE-XR) 500 MG 24 hr tablet TAKE 1 TABLET (500 MG TOTAL)  BY MOUTH 2 (TWO) TIMES DAILY WITH A MEAL (AM+EVENING) 180 tablet 1   metoprolol (TOPROL-XL) 200 MG 24 hr tablet TAKE 0.5 TABLETS (100 MG TOTAL) BY MOUTH DAILY. TAKE WITH OR IMMEDIATELY FOLLOWING A MEAL (EVENING) 15 tablet 6   Oxcarbazepine (TRILEPTAL) 300 MG tablet TAKE 2 TABLETS (600 MG TOTAL) BY MOUTH 2 (TWO) TIMES DAILY (AM+EVENING) 360 tablet 1   phenytoin (DILANTIN) 100 MG ER capsule TAKE ONE CAPSULE BY MOUTH THREE TIMES DAILY (AM+NOON+BEDTIME) 270 capsule 1   potassium chloride SA (KLOR-CON M) 20 MEQ tablet Take 1 tablet (20 mEq total) by mouth 2 (two) times a week. Take every Mon, Wed and Fri with Lasix (Patient taking differently: Take 20 mEq by mouth 2 (two) times a week. Take every Mon,and Fri with Lasix) 30 tablet 4   pregabalin (LYRICA) 300 MG capsule TAKE 1 CAPSULE (300 MG TOTAL) BY MOUTH AT BEDTIME. 30 capsule 3   sacubitril-valsartan (ENTRESTO) 49-51 MG Take 1 tablet by mouth 2 (two) times daily. 60 tablet 8   sildenafil (VIAGRA) 50 MG tablet 1 tab p.o. half hour prior to intercourse PRN.  Limit use to 1 tab/24 hr 10 tablet 6   spironolactone (ALDACTONE) 25 MG tablet TAKE 1 TABLET (25 MG TOTAL) BY MOUTH AT BEDTIME. 90 tablet 1   TRESIBA FLEXTOUCH 200 UNIT/ML FlexTouch Pen INJECT 20 UNITS INTO THE SKIN DAILY. 18 mL 3   warfarin (COUMADIN) 5 MG tablet TAKE 1 TO 1 & 1/2 TABLETS (5 TO 7.5 MG TOTAL) BY MOUTH DAILY. 40 tablet 3   Blood Pressure Monitor DEVI Use as directed to check home blood pressure 2-3 times a week (Patient not taking: Reported on 09/12/2022) 1 Device 0   No current facility-administered medications for this encounter.   BP 110/84   Pulse 77   Wt 72.7 kg (160 lb 3.2 oz)   SpO2 99%   BMI 22.34 kg/m   Wt Readings from Last 3 Encounters:  09/12/22 72.7 kg (160 lb 3.2 oz)  08/08/22 72.6 kg (160 lb)  07/29/22 72.1 kg  (159 lb)   PHYSICAL EXAM: General:  NAD. No resp difficulty, walked into clinic, thin HEENT: Normal Neck: Supple. No JVD. Carotids 2+ bilat; no bruits. No lymphadenopathy or thryomegaly appreciated. Cor: PMI nondisplaced. Regular rate & rhythm. No rubs, gallops or murmurs. Lungs: Clear Abdomen: Soft, nontender, nondistended. No hepatosplenomegaly. No bruits or masses. Good bowel sounds. Extremities: No cyanosis, clubbing, rash, edema Neuro: Alert & oriented x 3, cranial nerves grossly intact. Moves all 4 extremities w/o difficulty. Affect pleasant.  ASSESSMENT & PLAN:  1. Chronic systolic CHF: Nonischemic cardiomyopathy known for years (since at least 2008).  Cardiac MRI in 2008 with EF 45%, no LGE.  Normal coronary CTA in 2014.  Echo (7/22) with EF 10-15%, LV thrombus, moderate RV dysfunction, dilated IVC. R/LHC (6/22) showed normal filling pressures, preserved cardiac output and no significant CAD. CPX in 9/22 showed severe HF limitation concerning for advanced HF.  Echo in 9/22 showed EF < 20% with mild LV dilation, no LV thrombus noted, normal RV size with mildly decreased systolic function, IVC normal. Medtronic ICD placed 5/23. Echo 1/24 showed EF < 20% with global hypokinesis, normal RV size and systolic function.  RHC 2/24 showed normal filling pressures with preserved cardiac output. CPX test 2/24 showed severe HF limitation. NYHA class III symptoms chronically. He is not volume overloaded on exam. Ongoing workup at [facility] for heart transplant. He is blood type A.  - Continue Entresto 49/51  mg bid. Recent labs reviewed and are stable, K 3.9, creatinine 0.8. - Continue spironolactone 25 mg qhs.  - Continue Farxiga 5 mg daily (this was started by endocrinology), ? if his endocrinologist agreeable to increasing to 10 mg. - Continue Lasix 20 mg twice a week and KCl twice a week.   - Continue Toprol XL 100 mg daily. - Continue Ivabradine 5 mg bid.  - Continue digoxin 0.125. Check dig level  today. - Though cardiac output was preserved on 6/22 and 3/23 RHCs, CPX in 9/22 showed a severe HF limitation concerning for advanced HF. RHC (2/24) with preserved cardiac output, CPX (2/24) with severe HF limitation. He has been seen by [facility] for consideration of transplant.  Ongoing workup. Plans to go back to [facility] 7/24, if hgb A1C < 8, he will be formally listed for heart transplant. - Refer back to CR (or PREP) to continue working on his functional status in preparation for transplant.  2. Syncope: Multiple prior episodes though none recently.  ?Orthostatic-related vs related to hypo- or hyperglycemia, no arrhythmia has been noted.  He has an ICD.  3. Diabetes: Insulin-dependent, started in his 62s.  Control better recently but has been poor in past.  - Followed by endocrinology. Now on Farxiga, would hold off on increasing to 10 mg with dizziness and fatigue. - Most recent hgb A1C 9.2.  4. 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  - On keppra. Neurology weaning off dilantin. 5. 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  Followed by Neurology. - On Coumadin. Check INR today and forward to Coumadin Clinic. - On statin, good lipids 1/24 6. LV Thrombus: with associated CVA. Most recent echo did not show a thrombus.  - On Coumadin. No bleeding issues.  Follow up in 3 months with Dr. Shirlee Latch.  Anderson [facility] FNP-BC 09/12/2022

## 2022-09-12 ENCOUNTER — Encounter (HOSPITAL_COMMUNITY): Payer: Self-pay

## 2022-09-12 ENCOUNTER — Ambulatory Visit
Admission: RE | Admit: 2022-09-12 | Discharge: 2022-09-12 | Disposition: A | Discharge status: Home / Self Care | Payer: Medicaid Other | Source: Ambulatory Visit | Attending: Family Medicine | Admitting: Family Medicine

## 2022-09-12 ENCOUNTER — Ambulatory Visit (INDEPENDENT_AMBULATORY_CARE_PROVIDER_SITE_OTHER): Payer: Medicaid Other

## 2022-09-12 VITALS — BP 110/84 | HR 77 | Wt 160.2 lb

## 2022-09-12 DIAGNOSIS — I5022 Chronic systolic (congestive) heart failure: Secondary | ICD-10-CM

## 2022-09-12 DIAGNOSIS — Z8673 Personal history of transient ischemic attack (TIA), and cerebral infarction without residual deficits: Secondary | ICD-10-CM | POA: Insufficient documentation

## 2022-09-12 DIAGNOSIS — Z79899 Other long term (current) drug therapy: Secondary | ICD-10-CM | POA: Diagnosis not present

## 2022-09-12 DIAGNOSIS — I428 Other cardiomyopathies: Secondary | ICD-10-CM | POA: Insufficient documentation

## 2022-09-12 DIAGNOSIS — Z5181 Encounter for therapeutic drug level monitoring: Secondary | ICD-10-CM | POA: Diagnosis not present

## 2022-09-12 DIAGNOSIS — Z794 Long term (current) use of insulin: Secondary | ICD-10-CM | POA: Diagnosis not present

## 2022-09-12 DIAGNOSIS — R569 Unspecified convulsions: Secondary | ICD-10-CM | POA: Diagnosis not present

## 2022-09-12 DIAGNOSIS — E1042 Type 1 diabetes mellitus with diabetic polyneuropathy: Secondary | ICD-10-CM | POA: Insufficient documentation

## 2022-09-12 DIAGNOSIS — Z87898 Personal history of other specified conditions: Secondary | ICD-10-CM

## 2022-09-12 DIAGNOSIS — W1839XA Other fall on same level, initial encounter: Secondary | ICD-10-CM | POA: Diagnosis not present

## 2022-09-12 DIAGNOSIS — Z833 Family history of diabetes mellitus: Secondary | ICD-10-CM | POA: Diagnosis not present

## 2022-09-12 DIAGNOSIS — I513 Intracardiac thrombosis, not elsewhere classified: Secondary | ICD-10-CM

## 2022-09-12 DIAGNOSIS — Z7682 Awaiting organ transplant status: Secondary | ICD-10-CM | POA: Insufficient documentation

## 2022-09-12 DIAGNOSIS — E1165 Type 2 diabetes mellitus with hyperglycemia: Secondary | ICD-10-CM

## 2022-09-12 DIAGNOSIS — Z95810 Presence of automatic (implantable) cardiac defibrillator: Secondary | ICD-10-CM | POA: Insufficient documentation

## 2022-09-12 DIAGNOSIS — Z7901 Long term (current) use of anticoagulants: Secondary | ICD-10-CM | POA: Insufficient documentation

## 2022-09-12 DIAGNOSIS — R471 Dysarthria and anarthria: Secondary | ICD-10-CM | POA: Insufficient documentation

## 2022-09-12 DIAGNOSIS — I110 Hypertensive heart disease with heart failure: Secondary | ICD-10-CM | POA: Insufficient documentation

## 2022-09-12 DIAGNOSIS — G40909 Epilepsy, unspecified, not intractable, without status epilepticus: Secondary | ICD-10-CM | POA: Insufficient documentation

## 2022-09-12 DIAGNOSIS — I639 Cerebral infarction, unspecified: Secondary | ICD-10-CM | POA: Diagnosis not present

## 2022-09-12 LAB — PROTIME-INR
INR: 1.9 — ABNORMAL HIGH (ref 0.8–1.2)
Prothrombin Time: 22.2 seconds — ABNORMAL HIGH (ref 11.4–15.2)

## 2022-09-12 LAB — DIGOXIN LEVEL: Digoxin Level: 0.7 ng/mL — ABNORMAL LOW (ref 0.8–2.0)

## 2022-09-12 NOTE — Patient Instructions (Signed)
Description   Called and spoke with pt. Instructed to take 2 tablets today and then continue taking warfarin 1.5 tablets daily except for 1 tablet on Mondays, Wednesday and Fridays.  Recheck INR in 3 weeks. (Dilantin decreased first of May) Coumadin Clinic [phone]

## 2022-09-12 NOTE — Progress Notes (Signed)
Remote ICD transmission.   

## 2022-09-12 NOTE — Addendum Note (Signed)
Encounter addended by: Jacklynn Ganong, FNP on: 09/12/2022 3:03 PM  Actions taken: Clinical Note Signed

## 2022-09-12 NOTE — Patient Instructions (Signed)
Thank you for coming in today  If you had labs drawn today, any labs that are abnormal the clinic will call you No news is good news  Medications:   Follow up appointments:  Your physician recommends that you schedule a follow-up appointment in:     Do the following things EVERYDAY: Weigh yourself in the morning before breakfast. Write it down and keep it in a log. Take your medicines as prescribed Eat low salt foods--Limit salt (sodium) to 2000 mg per day.  Stay as active as you can everyday Limit all fluids for the day to less than 2 liters   At the [facility], you and your health needs are our priority. As part of our continuing mission to provide you with exceptional heart care, we have created designated Provider Care Teams. These Care Teams include your primary Cardiologist (physician) and Advanced Practice Providers (APPs- Physician Assistants and Nurse Practitioners) who all work together to provide you with the care you need, when you need it.   You may see any of the following providers on your designated Care Team at your next follow up: Dr Arvilla Meres Dr Marca Ancona Dr. Marcos Eke, NP Robbie Lis, [place] [facility] [place], [place] Brynda Peon, NP Karle Plumber, PharmD   Please be sure to bring in all your medications bottles to every appointment.    Thank you for choosing [facility]  If you have any questions or concerns before your next appointment please send [place] a message through [place] or call our office at [phone].    TO LEAVE A MESSAGE FOR THE NURSE SELECT OPTION 2, PLEASE LEAVE A MESSAGE INCLUDING: YOUR NAME DATE OF BIRTH CALL BACK NUMBER REASON FOR CALL**this is important as we prioritize the call backs  YOU WILL RECEIVE A CALL BACK THE SAME DAY AS LONG AS YOU CALL BEFORE 4:00 PM

## 2022-09-13 ENCOUNTER — Ambulatory Visit: Payer: Medicaid Other

## 2022-09-20 ENCOUNTER — Telehealth (HOSPITAL_COMMUNITY): Payer: Self-pay | Admitting: Licensed Clinical Social Worker

## 2022-09-20 NOTE — Telephone Encounter (Signed)
H&V Care Navigation CSW Progress Note  Clinical Social Worker contacted by pt wife to inquire about assistance with shut off notice for electricity.  Pt has no income and wife lost her job recently so they got behind on bills.  She just started working again so now has income but not enough to pay $338 required at this time.  CSW able to assist through patient care fund.  SDOH Screenings   Food Insecurity: No Food Insecurity (09/14/2020)  Recent Concern: Food Insecurity - Food Insecurity Present (09/13/2020)  Housing: Medium Risk (01/28/2022)  Transportation Needs: Unmet Transportation Needs (12/15/2020)  Utilities: At Risk (09/20/2022)  Depression (PHQ2-9): Low Risk  (08/08/2022)  Financial Resource Strain: High Risk (10/18/2021)  Tobacco Use: Low Risk  (09/12/2022)    Burna Sis, LCSW Clinical Social Worker [facility] Desk#: [phone] Cell#: [phone]

## 2022-09-25 ENCOUNTER — Other Ambulatory Visit: Payer: Self-pay | Admitting: Internal Medicine

## 2022-09-25 ENCOUNTER — Other Ambulatory Visit (HOSPITAL_COMMUNITY): Payer: Self-pay | Admitting: Family Medicine

## 2022-09-27 ENCOUNTER — Telehealth: Payer: Self-pay

## 2022-09-27 NOTE — Telephone Encounter (Signed)
*  Endo  PA request received for FreeStyle Libre 2 Sensor  PA submitted to [employer] [place] and is pending additional questions/determination  Key: [id number]

## 2022-10-01 ENCOUNTER — Telehealth (HOSPITAL_COMMUNITY): Payer: Self-pay

## 2022-10-01 NOTE — Telephone Encounter (Signed)
PA has been APPROVED from 09/27/2022-09/26/2023

## 2022-10-01 NOTE — Telephone Encounter (Signed)
Recv'ed Medicaid form from Dr. Shirlee Latch office, with GXT test indicated. Called and spoke with Chantel (nurse) at Dr. Shirlee Latch office to verify the form is correct. She stated she wasn't sure and will send a message to Dr. Shirlee Latch.

## 2022-10-03 ENCOUNTER — Ambulatory Visit: Payer: Medicaid Other

## 2022-10-07 ENCOUNTER — Encounter: Payer: Self-pay | Admitting: Internal Medicine

## 2022-10-08 ENCOUNTER — Ambulatory Visit: Payer: Medicaid Other | Attending: Cardiology | Admitting: *Deleted

## 2022-10-08 DIAGNOSIS — I513 Intracardiac thrombosis, not elsewhere classified: Secondary | ICD-10-CM | POA: Diagnosis not present

## 2022-10-08 DIAGNOSIS — I639 Cerebral infarction, unspecified: Secondary | ICD-10-CM

## 2022-10-08 DIAGNOSIS — Z5181 Encounter for therapeutic drug level monitoring: Secondary | ICD-10-CM

## 2022-10-08 DIAGNOSIS — Z7901 Long term (current) use of anticoagulants: Secondary | ICD-10-CM

## 2022-10-08 LAB — POCT INR: INR: 2.1 (ref 2.0–3.0)

## 2022-10-08 NOTE — Patient Instructions (Signed)
Description   Continue taking warfarin 1.5 tablets daily except for 1 tablet on Mondays, Wednesday and Fridays. Recheck INR in 4 weeks. (Dilantin decreased first of May and continued until weaned off soon) Coumadin Clinic [phone]

## 2022-10-10 ENCOUNTER — Ambulatory Visit: Payer: Medicaid Other | Admitting: Internal Medicine

## 2022-10-14 ENCOUNTER — Encounter: Payer: Self-pay | Admitting: Internal Medicine

## 2022-10-14 DIAGNOSIS — E1165 Type 2 diabetes mellitus with hyperglycemia: Secondary | ICD-10-CM

## 2022-10-14 DIAGNOSIS — E1159 Type 2 diabetes mellitus with other circulatory complications: Secondary | ICD-10-CM

## 2022-10-14 MED ORDER — FIASP FLEXTOUCH 100 UNIT/ML ~~LOC~~ SOPN
5.0000 [IU] | PEN_INJECTOR | Freq: Three times a day (TID) | SUBCUTANEOUS | 3 refills | Status: DC
Start: 2022-10-14 — End: 2023-03-06

## 2022-10-14 MED ORDER — TRESIBA FLEXTOUCH 200 UNIT/ML ~~LOC~~ SOPN
20.0000 [IU] | PEN_INJECTOR | Freq: Every day | SUBCUTANEOUS | 3 refills | Status: DC
Start: 2022-10-14 — End: 2023-03-06

## 2022-10-18 ENCOUNTER — Encounter: Payer: Self-pay | Admitting: Internal Medicine

## 2022-10-18 IMAGING — CR DG CHEST 2V
2 series · 2 of 2 positions shown · non-contrast
Comparison: PA Lat 12/14/2020.

CLINICAL DATA: Encounter for ICD in place.

EXAM:
CHEST - 2 VIEW

[chest pa]
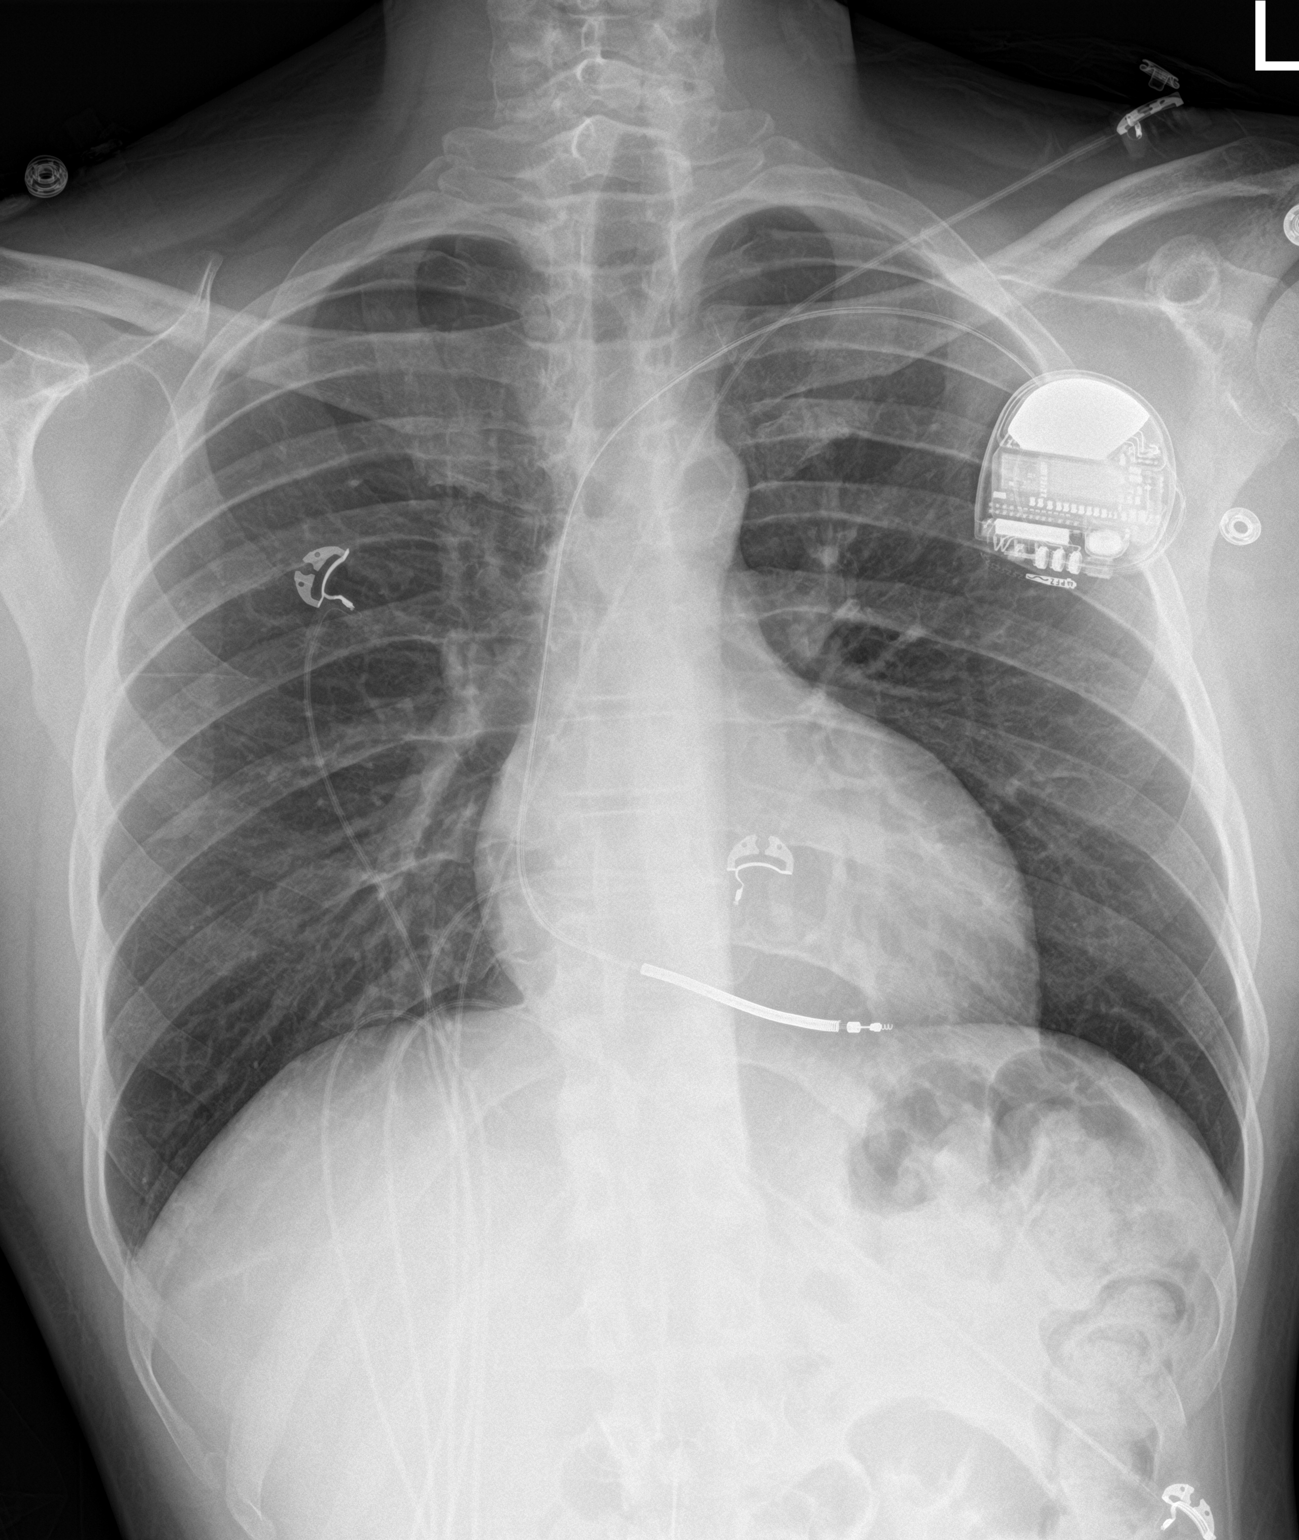

[chest lat]
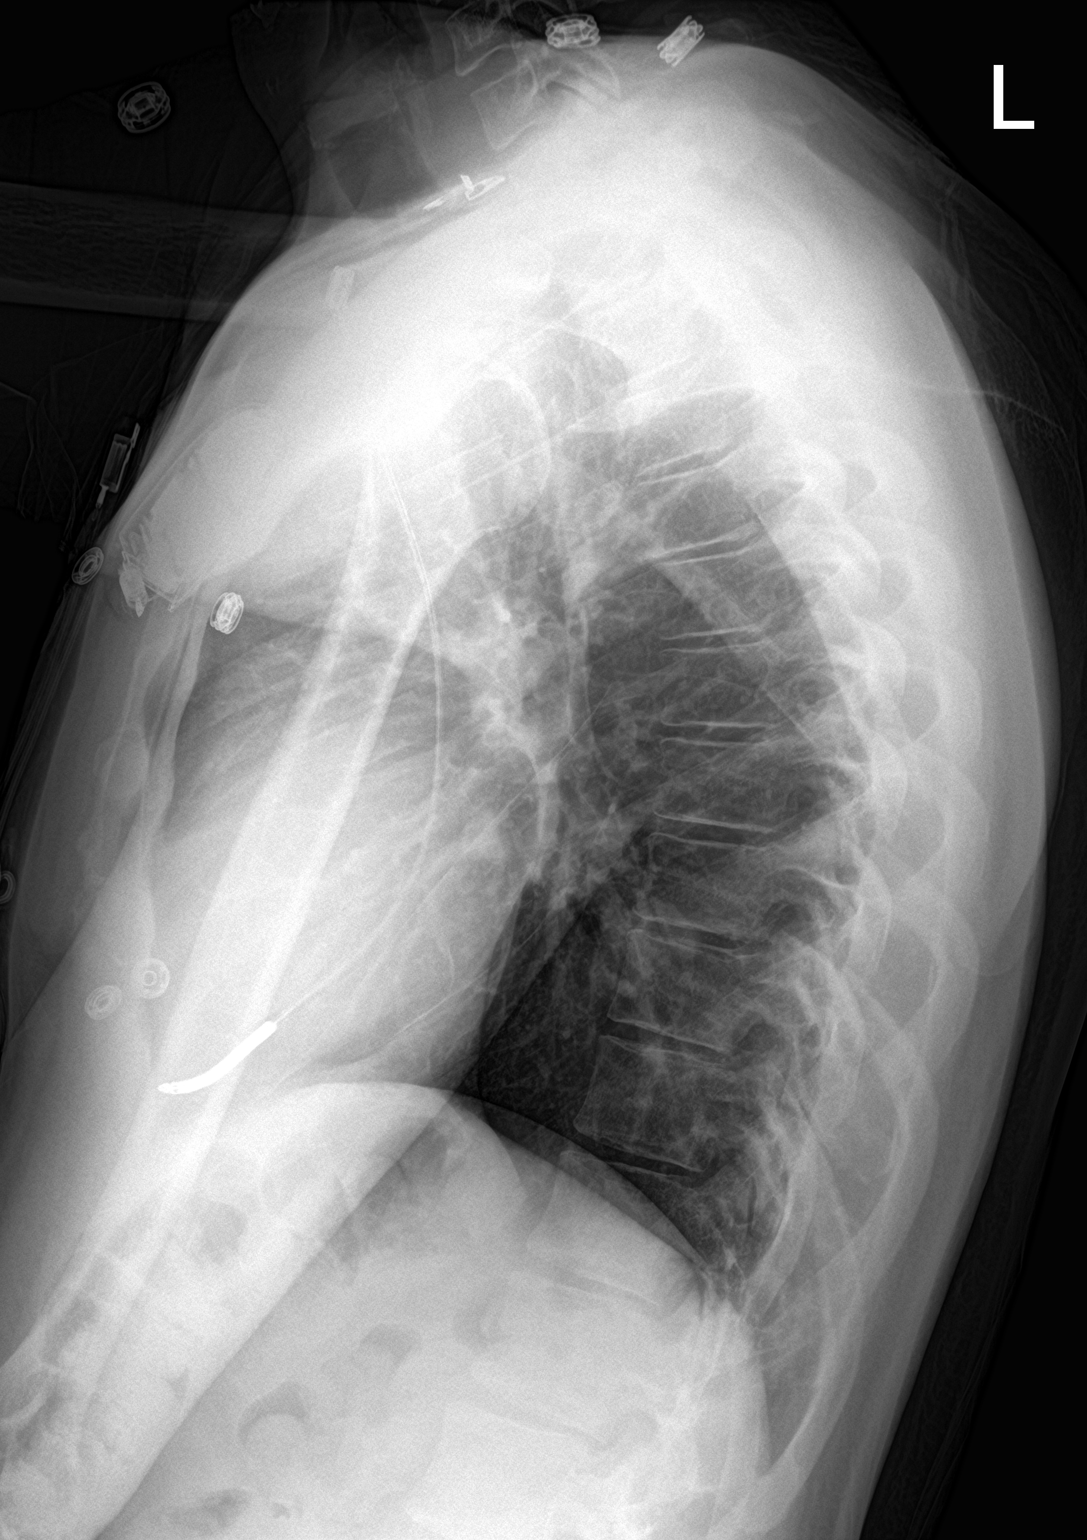

[2 of 2 positions shown; findings below may reference images not displayed]

FINDINGS: The heart size and mediastinal contours are within normal limits. A
left chest single lead ICD has been inserted since the prior study
with the wire terminating in the anterior right ventricle. There is
no pneumothorax. Both lungs are clear. The visualized skeletal
structures are unremarkable.
IMPRESSION: No evidence of acute chest disease. ICD wire tip is in the anterior
right atrium.

## 2022-10-18 NOTE — Telephone Encounter (Signed)
Ok for A1c his last one was 08/05/22

## 2022-10-22 ENCOUNTER — Other Ambulatory Visit (INDEPENDENT_AMBULATORY_CARE_PROVIDER_SITE_OTHER): Payer: Medicaid Other

## 2022-10-22 DIAGNOSIS — E1159 Type 2 diabetes mellitus with other circulatory complications: Secondary | ICD-10-CM

## 2022-10-22 DIAGNOSIS — E1165 Type 2 diabetes mellitus with hyperglycemia: Secondary | ICD-10-CM | POA: Diagnosis not present

## 2022-10-22 LAB — HEMOGLOBIN A1C: Hgb A1c MFr Bld: 7.6 % — ABNORMAL HIGH (ref 4.6–6.5)

## 2022-10-22 NOTE — Telephone Encounter (Signed)
LMTRC  JMiller,RMA 

## 2022-10-22 NOTE — Addendum Note (Signed)
Addended by: Pollie Meyer on: 10/22/2022 11:55 AM   Modules accepted: Orders

## 2022-10-22 NOTE — Telephone Encounter (Signed)
Pt called back and he has been scheduled for today at 1:45

## 2022-10-25 ENCOUNTER — Ambulatory Visit: Payer: Medicaid Other | Attending: Internal Medicine | Admitting: Internal Medicine

## 2022-10-25 ENCOUNTER — Encounter: Payer: Self-pay | Admitting: Internal Medicine

## 2022-10-25 VITALS — BP 114/75 | HR 61 | Wt 167.8 lb

## 2022-10-25 DIAGNOSIS — G40909 Epilepsy, unspecified, not intractable, without status epilepticus: Secondary | ICD-10-CM | POA: Diagnosis not present

## 2022-10-25 DIAGNOSIS — I5022 Chronic systolic (congestive) heart failure: Secondary | ICD-10-CM

## 2022-10-25 DIAGNOSIS — E1142 Type 2 diabetes mellitus with diabetic polyneuropathy: Secondary | ICD-10-CM | POA: Diagnosis not present

## 2022-10-25 DIAGNOSIS — Z794 Long term (current) use of insulin: Secondary | ICD-10-CM | POA: Diagnosis not present

## 2022-10-25 DIAGNOSIS — R42 Dizziness and giddiness: Secondary | ICD-10-CM

## 2022-10-25 NOTE — Progress Notes (Signed)
Patient ID: Jason Stevenson, male    DOB: 1986-02-18  MRN: [id number]  CC: Dizziness   Subjective: Jason Stevenson is a 37 y.o. male who presents for chronic ds.  Management.  Wife Jason Stevenson is with him His concerns today include:  Patient with history of DM with painful neuropathy, HTN, HL, NICM with systolic CHF EF <20%, ICD ( Cardiologist Dr. 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 Intermittent - sitting and standing.  No dizziness when rolling over in bed.  No ringing in ears today.   Had similar episode 2 days ago after taking meds Usually last 2-3 hrs.   Drinking adequate fluids; wife thinks he needs to eat a little more On on Lasix and K+ supplement 2 days a wk, Entresto 49/51 mg BID,  spironolactone 25 mg daily, Farxiga 5 mg daily, Toprol XL 100 mg daily, ivabradine 5 mg twice a day, digoxin 0.125 mg daily.  He takes all of his morning medications at the same time. -not checking BP.   -has virtual visit today with [facility]. They want A1C <8 as part of criteria to get him on transplant list.  Recent A1C 7.8  He was continued on metformin ER 500 mg twice a day, Farxiga 5 mg daily, Tresiba 20 units daily and Fiasp 6-10 units with meals.  Has Libre CGM.  TIR for the past 7 days: He was in targeted range 86% of the time and above target range 12% of the times For the past 14 days, he was within target range 80% of the time and above target 13% of the times.  Seizure disorder: no Sz since last visit;Saw Dr. Marjory Stevenson 07/24/2022.  He was continued on Keppra 1500 mg twice a day and Trileptal 600 mg twice a day.  Now completely off Dilantin.  Still doing counseling On Cymbalta Patient Active Problem List   Diagnosis Date Noted   Mild major depression (HCC) 06/03/2022   Nonischemic cardiomyopathy  (HCC) 08/27/2021   LADA (latent autoimmune diabetes in adults), managed as type 2 (HCC) 03/28/2021   Poorly controlled type 2 diabetes mellitus with circulatory disorder (HCC) 03/19/2021   Ischemic cardiomyopathy    Syncope 12/14/2020   Chest pain 10/30/2020   History of cerebrovascular accident (CVA) with residual deficit 10/27/2020   Acute CVA (cerebrovascular accident) (HCC) 09/21/2020   Near syncope 09/21/2020   Encounter for monitoring Coumadin therapy 09/20/2020   LV (left ventricular) mural thrombus 09/13/2020   Seizure (HCC) 09/13/2020   Neurological deficit present 09/12/2020   History of COVID-19 09/12/2020   Intracerebral hemorrhage 09/12/2020   Acute cerebrovascular accident (CVA) (HCC) 09/12/2020   COVID-19 08/23/2020   Noncompliance with medication treatment due to intermittent use of medication 10/12/2018   Dyslipidemia 04/17/2016   Chronic systolic CHF (congestive heart failure) (HCC) 06/18/2015   Needs flu shot 12/23/2013   Non-ischemic cardiomyopathy (HCC) 11/06/2012   HTN (hypertension) 11/06/2012   HLD (hyperlipidemia) 11/06/2012     Current Outpatient Medications on File Prior to Visit  Medication Sig Dispense Refill   ACCU-CHEK GUIDE test strip USE TO CHECK BLOOD SUGAR THREE TIMES DAILY. 100 each 2   acetaminophen (TYLENOL) 325 MG tablet Take 1-2 tablets (325-650 mg total) by mouth every 4 (four) hours as needed for mild pain.     atorvastatin (LIPITOR) 80 MG tablet TAKE 1 TABLET (  80 MG TOTAL) BY MOUTH DAILY (EVENING) 90 tablet 1   Blood Glucose Monitoring Suppl (ACCU-CHEK GUIDE ME) w/Device KIT USE TO CHECK BLOOD SUGAR THREE TIMES DAILY. 1 kit 0   Blood Pressure Monitor DEVI Use as directed to check home blood pressure 2-3 times a week (Patient not taking: Reported on 09/12/2022) 1 Device 0   Continuous Blood Gluc Sensor (FREESTYLE LIBRE 2 SENSOR) MISC USE EVERY 14 (FOURTEEN) DAYS. 6 each 1   CORLANOR 5 MG TABS tablet TAKE 1 TABLET (5 MG TOTAL) BY MOUTH 2 (TWO)  TIMES DAILY WITH A MEAL (AM+EVENING) 60 tablet 6   dapagliflozin propanediol (FARXIGA) 5 MG TABS tablet TAKE 1 TABLET (5 MG TOTAL) BY MOUTH DAILY BEFORE BREAKFAST. 30 tablet 11   digoxin (LANOXIN) 0.125 MG tablet TAKE 1 TABLET (0.125 MG TOTAL) BY MOUTH DAILY (AM) 30 tablet 6   DULoxetine (CYMBALTA) 30 MG capsule Take 60 mg by mouth daily. Dose increase by psychiatry     EASY COMFORT PEN NEEDLES 32G X 4 MM MISC USE TO INJECT INSULIN FOUR TIMES A DAY 200 each 2   Evolocumab (REPATHA SURECLICK) 140 MG/ML SOAJ Inject 140 mg into the skin every 14 (fourteen) days. 2 mL 11   ezetimibe (ZETIA) 10 MG tablet TAKE 1 TABLET (10 MG TOTAL) BY MOUTH DAILY (AM) 90 tablet 3   furosemide (LASIX) 20 MG tablet Take 1 tablet (20 mg total) by mouth 2 (two) times a week. Mondays Wednesdays and Friday (Patient taking differently: Take 20 mg by mouth 2 (two) times a week. Monday and Friday) 30 tablet 11   insulin aspart (FIASP FLEXTOUCH) 100 UNIT/ML FlexTouch Pen Inject 5-8 Units into the skin 3 (three) times daily before meals. 30 mL 3   insulin degludec (TRESIBA FLEXTOUCH) 200 UNIT/ML FlexTouch Pen Inject 20 Units into the skin daily. 18 mL 3   Insulin Syringe-Needle U-100 (RELION INSULIN SYRINGE) 31G X 15/64" 0.3 ML MISC Use to inject insulin daily. 100 each 11   levETIRAcetam (KEPPRA) 750 MG tablet TAKE 2 TABLETS BY MOUTH 2 (TWO) TIMES DAILY (2AM+ 2EVENING) 360 tablet 1   loperamide (IMODIUM) 2 MG capsule Take 1 capsule (2 mg total) by mouth 4 (four) times daily as needed for diarrhea or loose stools. 12 capsule 0   Menthol, Topical Analgesic, (ICY HOT BACK EX) Apply 1 application  topically daily as needed (neuropathy). Icy hot cream     metFORMIN (GLUCOPHAGE-XR) 500 MG 24 hr tablet TAKE 1 TABLET (500 MG TOTAL) BY MOUTH 2 (TWO) TIMES DAILY WITH A MEAL (AM+EVENING) 180 tablet 1   metoprolol (TOPROL-XL) 200 MG 24 hr tablet TAKE 0.5 TABLETS (100 MG TOTAL) BY MOUTH DAILY. TAKE WITH OR IMMEDIATELY FOLLOWING A MEAL (EVENING)  15 tablet 6   Oxcarbazepine (TRILEPTAL) 300 MG tablet TAKE 2 TABLETS (600 MG TOTAL) BY MOUTH 2 (TWO) TIMES DAILY (AM+EVENING) 360 tablet 1   potassium chloride SA (KLOR-CON M) 20 MEQ tablet TAKE 1 TABLET (20 MEQ TOTAL) BY MOUTH 3 (THREE) TIMES A WEEK. TAKE EVERY MON, WED AND FRI WITH LASIX 30 tablet 4   pregabalin (LYRICA) 300 MG capsule TAKE 1 CAPSULE (300 MG TOTAL) BY MOUTH AT BEDTIME. 30 capsule 3   sacubitril-valsartan (ENTRESTO) 49-51 MG Take 1 tablet by mouth 2 (two) times daily. 60 tablet 8   sildenafil (VIAGRA) 50 MG tablet 1 tab p.o. half hour prior to intercourse PRN.  Limit use to 1 tab/24 hr 10 tablet 6   spironolactone (ALDACTONE) 25 MG tablet TAKE  1 TABLET (25 MG TOTAL) BY MOUTH AT BEDTIME. 90 tablet 1   warfarin (COUMADIN) 5 MG tablet TAKE 1 TO 1 & 1/2 TABLETS (5 TO 7.5 MG TOTAL) BY MOUTH DAILY. 40 tablet 3   No current facility-administered medications on file prior to visit.    No Known Allergies  Social History   Socioeconomic History   Marital status: Married    Spouse name: Jason Stevenson   Number of children: 0   Years of education: 13   Highest education level: Some college, no degree  Occupational History   Occupation: unemployed  Tobacco Use   Smoking status: Never   Smokeless tobacco: Never  Vaping Use   Vaping status: Not on file  Substance and Sexual Activity   Alcohol use: Not Currently    Comment: "occasional" when "hanging out with the wrong people" No recent use.   Drug use: Not Currently    Types: Marijuana    Comment: occasional, last 2013   Sexual activity: Not Currently  Other Topics Concern   Not on file  Social History Narrative   Pt lives at home with his wife    Right handed    Caffeine- hardly any   Social Determinants of Health   Financial Resource Strain: High Risk (10/18/2021)   Overall Financial Resource Strain (CARDIA)    Difficulty of Paying Living Expenses: Hard  Food Insecurity: No Food Insecurity (09/14/2020)   Hunger Vital Sign     Worried About Running Out of Food in the Last Year: Never true    Ran Out of Food in the Last Year: Never true  Recent Concern: Food Insecurity - Food Insecurity Present (09/13/2020)   Hunger Vital Sign    Worried About Running Out of Food in the Last Year: Never true    Ran Out of Food in the Last Year: Sometimes true  Transportation Needs: Unmet Transportation Needs (12/15/2020)   PRAPARE - Administrator, Civil Service (Medical): No    Lack of Transportation (Non-Medical): Yes  Physical Activity: Not on file  Stress: Not on file  Social Connections: Unknown (08/13/2021)   [facility] from [facility]   Social Network    Social Network: Not on file  Intimate Partner Violence: Unknown (07/17/2021)   Received from [facility]   HITS    Physically Hurt: Not on file    Insult or Talk Down To: Not on file    Threaten Physical Harm: Not on file    Scream or Curse: Not on file    Family History  Problem Relation Age of Onset   Stroke Mother    Diabetes Mother    Hypertension Mother    Stroke Maternal Aunt    Heart attack Neg Hx     Past Surgical History:  Procedure Laterality Date   ICD IMPLANT N/A 08/27/2021   Procedure: ICD IMPLANT;  Surgeon: Lanier Prude, MD;  Location: [facility] INVASIVE CV LAB;  Service: Cardiovascular;  Laterality: N/A;   None     RIGHT HEART CATH  06/21/2021   [facility]   RIGHT HEART CATH N/A 05/17/2022   Procedure: RIGHT HEART CATH;  Surgeon: Laurey Morale, MD;  Location: [facility] INVASIVE CV LAB;  Service: Cardiovascular;  Laterality: N/A;   RIGHT/LEFT HEART CATH AND CORONARY ANGIOGRAPHY N/A 11/02/2020   Procedure: RIGHT/LEFT HEART CATH AND CORONARY ANGIOGRAPHY;  Surgeon: Laurey Morale, MD;  Location: [facility] INVASIVE CV LAB;  Service: Cardiovascular;  Laterality: N/A;    ROS:  Review of Systems Negative except as stated above  PHYSICAL EXAM: BP 114/75 (BP Location: Right Arm, Patient Position: Sitting, Cuff Size: Normal)   Pulse 61   Wt 167  lb 12.8 oz (76.1 kg)   SpO2 98%   BMI 23.40 kg/m   Physical Exam Laying down: BP 109/73, pulse of 59 Sitting: BP 113/75, pulse 57 Standing BP 118/79, pulse 69  General appearance - alert, well appearing, young to middle-aged African-American male and in no distress Mental status - normal mood, behavior, speech, dress, motor activity, and thought processes Mouth - mucous membranes moist, pharynx normal without lesions Neck - supple, no significant adenopathy Chest - clear to auscultation, no wheezes, rales or rhonchi, symmetric air entry Heart - normal rate, regular rhythm, normal S1, S2, no murmurs, rubs, clicks or gallops Neurological - cranial nerves II through XII intact, motor and sensory grossly normal bilaterally.  Gait is normal and unassisted. Extremities -no lower extremity edema. Diabetic Foot Exam - Simple   Simple Foot Form  10/25/2022 12:28 PM  Visual Inspection See comments: Yes Sensation Testing Intact to touch and monofilament testing bilaterally: Yes Pulse Check Posterior Tibialis and Dorsalis pulse intact bilaterally: Yes Comments Slightly flat-footed.    Lab Results  Component Value Date   HGBA1C 7.6 (H) 10/22/2022        Latest Ref Rng & Units 06/28/2022   11:43 AM 06/04/2022   10:41 PM 05/17/2022    7:56 AM  CMP  Glucose 70 - 99 mg/dL 454  098    BUN 6 - 20 mg/dL 24  14    Creatinine 1.19 - 1.24 mg/dL 1.47  8.29    Sodium 562 - 145 mmol/L 134  140  143    144   Potassium 3.5 - 5.1 mmol/L 4.9  4.1  3.9    3.8   Chloride 98 - 111 mmol/L 100  102    CO2 22 - 32 mmol/L 25  28    Calcium 8.9 - 10.3 mg/dL 8.6  9.0     Lipid Panel     Component Value Date/Time   CHOL 175 04/30/2022 1106   CHOL 107 06/08/2021 0851   TRIG 82 04/30/2022 1106   HDL 61 04/30/2022 1106   HDL 49 06/08/2021 0851   CHOLHDL 2.9 04/30/2022 1106   VLDL 16 04/30/2022 1106   LDLCALC 98 04/30/2022 1106   LDLCALC 31 06/08/2021 0851    CBC    Component Value Date/Time    WBC 7.0 06/04/2022 2241   RBC 4.69 06/04/2022 2241   HGB 13.9 06/04/2022 2241   HGB 13.7 08/03/2021 1313   HCT 41.7 06/04/2022 2241   HCT 40.0 08/03/2021 1313   PLT 216 06/04/2022 2241   PLT 275 08/03/2021 1313   MCV 88.9 06/04/2022 2241   MCV 83 08/03/2021 1313   MCH 29.6 06/04/2022 2241   MCHC 33.3 06/04/2022 2241   RDW 11.9 06/04/2022 2241   RDW 11.8 08/03/2021 1313   LYMPHSABS 2.2 06/04/2022 2241   LYMPHSABS 2.0 08/03/2021 1313   MONOABS 0.4 06/04/2022 2241   EOSABS 0.1 06/04/2022 2241   EOSABS 0.1 08/03/2021 1313   BASOSABS 0.1 06/04/2022 2241   BASOSABS 0.1 08/03/2021 1313    ASSESSMENT AND PLAN:  1. Type 2 diabetes mellitus with diabetic polyneuropathy, with long-term current use of insulin (HCC) A1c is improved. He will continue current dose of metformin XR 500 mg twice a day, Farxiga 5 mg, Tresiba and [place] as prescribed by Dr.  Gerghe  2. Chronic systolic congestive heart failure (HCC) Stable and compensated.  Continue [place]if[place] Custard, Corlanor 5 mg BID, metoprolol XL 200 mg half a tablet daily, furosemide twice a week and digoxin 0.125 mg daily.  3. Seizure disorder (HCC) Stable on current medications.  Continue Keppra and Trileptal  4. Dizziness Advised patient to space out his morning blood pressure medications instead of taking them all at the same time. - CBC - Basic metabolic panel    Patient was given the opportunity to ask questions.  Patient verbalized understanding of the plan and was able to repeat key elements of the plan.   This documentation was completed using Paediatric nurse.  Any transcriptional errors are unintentional.  Orders Placed This Encounter  Procedures   CBC   Basic metabolic panel     Requested Prescriptions    No prescriptions requested or ordered in this encounter    Return in about 3 months (around 01/25/2023) for can appt 12/19/2022.  [name], MD, FACP

## 2022-10-26 LAB — BASIC METABOLIC PANEL
BUN/Creatinine Ratio: 17 (ref 9–20)
BUN: 16 mg/dL (ref 6–20)
CO2: 27 mmol/L (ref 20–29)
Calcium: 9 mg/dL (ref 8.7–10.2)
Chloride: 102 mmol/L (ref 96–106)
Creatinine, Ser: 0.95 mg/dL (ref 0.76–1.27)
Glucose: 154 mg/dL — ABNORMAL HIGH (ref 70–99)
Potassium: 4.9 mmol/L (ref 3.5–5.2)
Sodium: 142 mmol/L (ref 134–144)
eGFR: 104 mL/min/{1.73_m2} (ref >59)

## 2022-10-26 LAB — CBC
Hematocrit: 39.1 % (ref 37.5–51.0)
Hemoglobin: 12.8 g/dL — ABNORMAL LOW (ref 13.0–17.7)
MCH: 28.6 pg (ref 26.6–33.0)
MCHC: 32.7 g/dL (ref 31.5–35.7)
MCV: 88 fL (ref 79–97)
Platelets: 195 10*3/uL (ref 150–450)
RBC: 4.47 x10E6/uL (ref 4.14–5.80)
RDW: 12.2 % (ref 11.6–15.4)
WBC: 3.8 10*3/uL (ref 3.4–10.8)

## 2022-10-28 ENCOUNTER — Other Ambulatory Visit: Payer: Self-pay | Admitting: Internal Medicine

## 2022-10-28 DIAGNOSIS — E139 Other specified diabetes mellitus without complications: Secondary | ICD-10-CM

## 2022-10-29 NOTE — Telephone Encounter (Signed)
Requested Prescriptions  Pending Prescriptions Disp Refills   atorvastatin (LIPITOR) 80 MG tablet [Pharmacy Med Name: ATORVASTATIN CALCIUM 80 MG ORAL TABLET] 90 tablet 1    Sig: TAKE 1 TABLET (80 MG TOTAL) BY MOUTH DAILY (EVENING)     Cardiovascular:  Antilipid - Statins Failed - 10/28/2022  1:36 PM      Failed - Lipid Panel in normal range within the last 12 months    Cholesterol, Total  Date Value Ref Range Status  06/08/2021 107 100 - 199 mg/dL Final   Cholesterol  Date Value Ref Range Status  04/30/2022 175 0 - 200 mg/dL Final   LDL Chol Calc (NIH)  Date Value Ref Range Status  06/08/2021 31 0 - 99 mg/dL Final   LDL Cholesterol  Date Value Ref Range Status  04/30/2022 98 0 - 99 mg/dL Final    Comment:           Total Cholesterol/HDL:CHD Risk Coronary Heart Disease Risk Table                     Men   Women  1/2 Average Risk   3.4   3.3  Average Risk       5.0   4.4  2 X Average Risk   9.6   7.1  3 X Average Risk  23.4   11.0        Use the calculated Patient Ratio above and the CHD Risk Table to determine the patient's CHD Risk.        ATP III CLASSIFICATION (LDL):  <100     mg/dL   Optimal  161-096  mg/dL   Near or Above                    Optimal  130-159  mg/dL   Borderline  045-409  mg/dL   High  >811     mg/dL   Very High Performed at [facility], [address] [place], [place] [address]    HDL  Date Value Ref Range Status  04/30/2022 61 >40 mg/dL Final  29/56/2130 49 >86 mg/dL Final   Triglycerides  Date Value Ref Range Status  04/30/2022 82 <150 mg/dL Final         Passed - Patient is not pregnant      Passed - Valid encounter within last 12 months    Recent Outpatient Visits           4 days ago Type 2 diabetes mellitus with diabetic polyneuropathy, with long-term current use of insulin (HCC)   [place] [facility] [name] B, MD   2 months ago Type 2 diabetes mellitus with diabetic polyneuropathy,  with long-term current use of insulin (HCC)   [place] [facility] [name] B, MD   4 months ago Type 2 diabetes mellitus with diabetic polyneuropathy, with long-term current use of insulin [facility])   [place] [facility] [name] B, MD   5 months ago Type 2 diabetes mellitus with diabetic polyneuropathy, with long-term current use of insulin [facility])   [place] [facility] [place], [place], [place]   7 months ago Erectile dysfunction associated with type 2 diabetes mellitus [facility])   [facility] Marcine Matar, MD       Future Appointments  In 1 month Marcine Matar, MD [facility]

## 2022-10-30 DIAGNOSIS — F411 Generalized anxiety disorder: Secondary | ICD-10-CM | POA: Diagnosis not present

## 2022-10-30 DIAGNOSIS — F331 Major depressive disorder, recurrent, moderate: Secondary | ICD-10-CM | POA: Diagnosis not present

## 2022-11-05 ENCOUNTER — Ambulatory Visit: Payer: Medicaid Other | Attending: Cardiovascular Disease | Admitting: *Deleted

## 2022-11-05 DIAGNOSIS — I513 Intracardiac thrombosis, not elsewhere classified: Secondary | ICD-10-CM

## 2022-11-05 DIAGNOSIS — Z5181 Encounter for therapeutic drug level monitoring: Secondary | ICD-10-CM | POA: Diagnosis not present

## 2022-11-05 DIAGNOSIS — Z7901 Long term (current) use of anticoagulants: Secondary | ICD-10-CM | POA: Diagnosis not present

## 2022-11-05 DIAGNOSIS — I639 Cerebral infarction, unspecified: Secondary | ICD-10-CM | POA: Diagnosis not present

## 2022-11-05 LAB — POCT INR: INR: 2 (ref 2.0–3.0)

## 2022-11-05 NOTE — Patient Instructions (Addendum)
Description   Today take 1.5 tablets today then continue taking warfarin 1.5 tablets daily except for 1 tablet on Mondays, Wednesday and Fridays. Recheck INR in 4 weeks. (Dilantin decreased first of May and continued until weaned off soon). Coumadin Clinic [phone]

## 2022-11-15 DIAGNOSIS — F418 Other specified anxiety disorders: Secondary | ICD-10-CM | POA: Diagnosis not present

## 2022-11-15 DIAGNOSIS — Z7682 Awaiting organ transplant status: Secondary | ICD-10-CM | POA: Diagnosis not present

## 2022-11-21 DIAGNOSIS — I5022 Chronic systolic (congestive) heart failure: Secondary | ICD-10-CM | POA: Diagnosis not present

## 2022-11-25 ENCOUNTER — Other Ambulatory Visit (HOSPITAL_COMMUNITY): Payer: Self-pay | Admitting: Family Medicine

## 2022-11-25 ENCOUNTER — Encounter: Payer: Self-pay | Admitting: Internal Medicine

## 2022-11-25 ENCOUNTER — Other Ambulatory Visit: Payer: Self-pay | Admitting: Internal Medicine

## 2022-11-25 ENCOUNTER — Other Ambulatory Visit: Payer: Self-pay | Admitting: Diagnostic Neuroimaging

## 2022-11-25 ENCOUNTER — Other Ambulatory Visit (HOSPITAL_COMMUNITY): Payer: Self-pay | Admitting: Cardiology

## 2022-11-25 DIAGNOSIS — Z794 Long term (current) use of insulin: Secondary | ICD-10-CM

## 2022-11-25 DIAGNOSIS — Z7901 Long term (current) use of anticoagulants: Secondary | ICD-10-CM

## 2022-11-26 DIAGNOSIS — F05 Delirium due to known physiological condition: Secondary | ICD-10-CM | POA: Diagnosis not present

## 2022-11-26 DIAGNOSIS — I5022 Chronic systolic (congestive) heart failure: Secondary | ICD-10-CM | POA: Diagnosis not present

## 2022-11-26 DIAGNOSIS — Z79899 Other long term (current) drug therapy: Secondary | ICD-10-CM | POA: Diagnosis not present

## 2022-11-26 DIAGNOSIS — I6930 Unspecified sequelae of cerebral infarction: Secondary | ICD-10-CM | POA: Diagnosis not present

## 2022-11-26 DIAGNOSIS — I420 Dilated cardiomyopathy: Secondary | ICD-10-CM | POA: Diagnosis not present

## 2022-11-26 DIAGNOSIS — E875 Hyperkalemia: Secondary | ICD-10-CM | POA: Diagnosis not present

## 2022-11-26 DIAGNOSIS — E872 Acidosis, unspecified: Secondary | ICD-10-CM | POA: Diagnosis not present

## 2022-11-26 DIAGNOSIS — D696 Thrombocytopenia, unspecified: Secondary | ICD-10-CM | POA: Diagnosis not present

## 2022-11-26 DIAGNOSIS — Z4502 Encounter for adjustment and management of automatic implantable cardiac defibrillator: Secondary | ICD-10-CM | POA: Diagnosis not present

## 2022-11-26 DIAGNOSIS — I5084 End stage heart failure: Secondary | ICD-10-CM | POA: Diagnosis not present

## 2022-11-26 DIAGNOSIS — Z7682 Awaiting organ transplant status: Secondary | ICD-10-CM | POA: Diagnosis not present

## 2022-11-26 DIAGNOSIS — I519 Heart disease, unspecified: Secondary | ICD-10-CM | POA: Diagnosis not present

## 2022-11-26 DIAGNOSIS — Z5986 Financial insecurity: Secondary | ICD-10-CM | POA: Diagnosis not present

## 2022-11-26 DIAGNOSIS — I429 Cardiomyopathy, unspecified: Secondary | ICD-10-CM | POA: Diagnosis not present

## 2022-11-26 DIAGNOSIS — E876 Hypokalemia: Secondary | ICD-10-CM | POA: Diagnosis not present

## 2022-11-26 DIAGNOSIS — Z8616 Personal history of COVID-19: Secondary | ICD-10-CM | POA: Diagnosis not present

## 2022-11-26 DIAGNOSIS — I110 Hypertensive heart disease with heart failure: Secondary | ICD-10-CM | POA: Diagnosis not present

## 2022-11-26 DIAGNOSIS — D84821 Immunodeficiency due to drugs: Secondary | ICD-10-CM | POA: Diagnosis not present

## 2022-11-26 DIAGNOSIS — Z1152 Encounter for screening for COVID-19: Secondary | ICD-10-CM | POA: Diagnosis not present

## 2022-11-26 DIAGNOSIS — I255 Ischemic cardiomyopathy: Secondary | ICD-10-CM | POA: Diagnosis not present

## 2022-11-26 DIAGNOSIS — N179 Acute kidney failure, unspecified: Secondary | ICD-10-CM | POA: Diagnosis not present

## 2022-11-26 DIAGNOSIS — Z5982 Transportation insecurity: Secondary | ICD-10-CM | POA: Diagnosis not present

## 2022-11-26 NOTE — Telephone Encounter (Signed)
Refill request for warfarin:  Last INR was 2.0 on 11/05/22 Next INR due 12/03/22 LOV was 09/12/22  CHF Clinic  Refill approved.

## 2022-11-27 DIAGNOSIS — I110 Hypertensive heart disease with heart failure: Secondary | ICD-10-CM | POA: Diagnosis not present

## 2022-11-27 DIAGNOSIS — I428 Other cardiomyopathies: Secondary | ICD-10-CM | POA: Diagnosis not present

## 2022-11-27 DIAGNOSIS — I5022 Chronic systolic (congestive) heart failure: Secondary | ICD-10-CM | POA: Diagnosis not present

## 2022-11-27 DIAGNOSIS — M96831 Postprocedural hemorrhage and hematoma of a musculoskeletal structure following other procedure: Secondary | ICD-10-CM | POA: Diagnosis not present

## 2022-11-27 DIAGNOSIS — E1159 Type 2 diabetes mellitus with other circulatory complications: Secondary | ICD-10-CM | POA: Diagnosis not present

## 2022-11-27 DIAGNOSIS — I5020 Unspecified systolic (congestive) heart failure: Secondary | ICD-10-CM | POA: Diagnosis not present

## 2022-11-27 DIAGNOSIS — Z4509 Encounter for adjustment and management of other cardiac device: Secondary | ICD-10-CM | POA: Diagnosis not present

## 2022-11-27 DIAGNOSIS — Y832 Surgical operation with anastomosis, bypass or graft as the cause of abnormal reaction of the patient, or of later complication, without mention of misadventure at the time of the procedure: Secondary | ICD-10-CM | POA: Diagnosis not present

## 2022-11-27 DIAGNOSIS — E782 Mixed hyperlipidemia: Secondary | ICD-10-CM | POA: Diagnosis not present

## 2022-11-27 DIAGNOSIS — I429 Cardiomyopathy, unspecified: Secondary | ICD-10-CM | POA: Diagnosis not present

## 2022-11-27 DIAGNOSIS — Z8673 Personal history of transient ischemic attack (TIA), and cerebral infarction without residual deficits: Secondary | ICD-10-CM | POA: Diagnosis not present

## 2022-11-27 DIAGNOSIS — I97638 Postprocedural hematoma of a circulatory system organ or structure following other circulatory system procedure: Secondary | ICD-10-CM | POA: Diagnosis not present

## 2022-11-27 DIAGNOSIS — I255 Ischemic cardiomyopathy: Secondary | ICD-10-CM | POA: Diagnosis not present

## 2022-11-27 DIAGNOSIS — Z941 Heart transplant status: Secondary | ICD-10-CM | POA: Diagnosis not present

## 2022-11-27 DIAGNOSIS — Z7682 Awaiting organ transplant status: Secondary | ICD-10-CM | POA: Diagnosis not present

## 2022-11-27 DIAGNOSIS — T8621 Heart transplant rejection: Secondary | ICD-10-CM | POA: Diagnosis not present

## 2022-11-28 DIAGNOSIS — E782 Mixed hyperlipidemia: Secondary | ICD-10-CM | POA: Diagnosis not present

## 2022-11-28 DIAGNOSIS — I110 Hypertensive heart disease with heart failure: Secondary | ICD-10-CM | POA: Diagnosis not present

## 2022-11-28 DIAGNOSIS — I5022 Chronic systolic (congestive) heart failure: Secondary | ICD-10-CM | POA: Diagnosis not present

## 2022-11-28 DIAGNOSIS — E1159 Type 2 diabetes mellitus with other circulatory complications: Secondary | ICD-10-CM | POA: Diagnosis not present

## 2022-11-28 DIAGNOSIS — Z8673 Personal history of transient ischemic attack (TIA), and cerebral infarction without residual deficits: Secondary | ICD-10-CM | POA: Diagnosis not present

## 2022-11-28 DIAGNOSIS — J9811 Atelectasis: Secondary | ICD-10-CM | POA: Diagnosis not present

## 2022-11-28 DIAGNOSIS — Z941 Heart transplant status: Secondary | ICD-10-CM | POA: Diagnosis not present

## 2022-11-28 DIAGNOSIS — I5020 Unspecified systolic (congestive) heart failure: Secondary | ICD-10-CM | POA: Diagnosis not present

## 2022-11-28 DIAGNOSIS — I255 Ischemic cardiomyopathy: Secondary | ICD-10-CM | POA: Diagnosis not present

## 2022-11-28 DIAGNOSIS — I428 Other cardiomyopathies: Secondary | ICD-10-CM | POA: Diagnosis not present

## 2022-11-28 DIAGNOSIS — Z7682 Awaiting organ transplant status: Secondary | ICD-10-CM | POA: Diagnosis not present

## 2022-11-29 DIAGNOSIS — Z8673 Personal history of transient ischemic attack (TIA), and cerebral infarction without residual deficits: Secondary | ICD-10-CM | POA: Diagnosis not present

## 2022-11-29 DIAGNOSIS — I428 Other cardiomyopathies: Secondary | ICD-10-CM | POA: Diagnosis not present

## 2022-11-29 DIAGNOSIS — I110 Hypertensive heart disease with heart failure: Secondary | ICD-10-CM | POA: Diagnosis not present

## 2022-11-29 DIAGNOSIS — J811 Chronic pulmonary edema: Secondary | ICD-10-CM | POA: Diagnosis not present

## 2022-11-29 DIAGNOSIS — E782 Mixed hyperlipidemia: Secondary | ICD-10-CM | POA: Diagnosis not present

## 2022-11-29 DIAGNOSIS — Z941 Heart transplant status: Secondary | ICD-10-CM | POA: Diagnosis not present

## 2022-11-29 DIAGNOSIS — I5022 Chronic systolic (congestive) heart failure: Secondary | ICD-10-CM | POA: Diagnosis not present

## 2022-11-29 DIAGNOSIS — I5020 Unspecified systolic (congestive) heart failure: Secondary | ICD-10-CM | POA: Diagnosis not present

## 2022-11-29 DIAGNOSIS — Z794 Long term (current) use of insulin: Secondary | ICD-10-CM | POA: Diagnosis not present

## 2022-11-29 DIAGNOSIS — Z7682 Awaiting organ transplant status: Secondary | ICD-10-CM | POA: Diagnosis not present

## 2022-11-29 DIAGNOSIS — E1165 Type 2 diabetes mellitus with hyperglycemia: Secondary | ICD-10-CM | POA: Diagnosis not present

## 2022-11-29 DIAGNOSIS — E1159 Type 2 diabetes mellitus with other circulatory complications: Secondary | ICD-10-CM | POA: Diagnosis not present

## 2022-11-29 DIAGNOSIS — I255 Ischemic cardiomyopathy: Secondary | ICD-10-CM | POA: Diagnosis not present

## 2022-11-30 DIAGNOSIS — E782 Mixed hyperlipidemia: Secondary | ICD-10-CM | POA: Diagnosis not present

## 2022-11-30 DIAGNOSIS — I519 Heart disease, unspecified: Secondary | ICD-10-CM | POA: Diagnosis not present

## 2022-11-30 DIAGNOSIS — Z7682 Awaiting organ transplant status: Secondary | ICD-10-CM | POA: Diagnosis not present

## 2022-11-30 DIAGNOSIS — T86298 Other complications of heart transplant: Secondary | ICD-10-CM | POA: Diagnosis not present

## 2022-11-30 DIAGNOSIS — D849 Immunodeficiency, unspecified: Secondary | ICD-10-CM | POA: Diagnosis not present

## 2022-11-30 DIAGNOSIS — Z941 Heart transplant status: Secondary | ICD-10-CM | POA: Diagnosis not present

## 2022-11-30 DIAGNOSIS — E1165 Type 2 diabetes mellitus with hyperglycemia: Secondary | ICD-10-CM | POA: Diagnosis not present

## 2022-11-30 DIAGNOSIS — I5020 Unspecified systolic (congestive) heart failure: Secondary | ICD-10-CM | POA: Diagnosis not present

## 2022-11-30 DIAGNOSIS — Z8673 Personal history of transient ischemic attack (TIA), and cerebral infarction without residual deficits: Secondary | ICD-10-CM | POA: Diagnosis not present

## 2022-11-30 DIAGNOSIS — Z789 Other specified health status: Secondary | ICD-10-CM | POA: Diagnosis not present

## 2022-11-30 DIAGNOSIS — I255 Ischemic cardiomyopathy: Secondary | ICD-10-CM | POA: Diagnosis not present

## 2022-11-30 DIAGNOSIS — I428 Other cardiomyopathies: Secondary | ICD-10-CM | POA: Diagnosis not present

## 2022-11-30 DIAGNOSIS — J811 Chronic pulmonary edema: Secondary | ICD-10-CM | POA: Diagnosis not present

## 2022-11-30 DIAGNOSIS — Z794 Long term (current) use of insulin: Secondary | ICD-10-CM | POA: Diagnosis not present

## 2022-11-30 DIAGNOSIS — D696 Thrombocytopenia, unspecified: Secondary | ICD-10-CM | POA: Diagnosis not present

## 2022-11-30 DIAGNOSIS — E1159 Type 2 diabetes mellitus with other circulatory complications: Secondary | ICD-10-CM | POA: Diagnosis not present

## 2022-11-30 DIAGNOSIS — Z0389 Encounter for observation for other suspected diseases and conditions ruled out: Secondary | ICD-10-CM | POA: Diagnosis not present

## 2022-11-30 DIAGNOSIS — I110 Hypertensive heart disease with heart failure: Secondary | ICD-10-CM | POA: Diagnosis not present

## 2022-11-30 DIAGNOSIS — I5022 Chronic systolic (congestive) heart failure: Secondary | ICD-10-CM | POA: Diagnosis not present

## 2022-12-01 DIAGNOSIS — Z7682 Awaiting organ transplant status: Secondary | ICD-10-CM | POA: Diagnosis not present

## 2022-12-01 DIAGNOSIS — Z794 Long term (current) use of insulin: Secondary | ICD-10-CM | POA: Diagnosis not present

## 2022-12-01 DIAGNOSIS — E1165 Type 2 diabetes mellitus with hyperglycemia: Secondary | ICD-10-CM | POA: Diagnosis not present

## 2022-12-01 DIAGNOSIS — Z789 Other specified health status: Secondary | ICD-10-CM | POA: Diagnosis not present

## 2022-12-01 DIAGNOSIS — D849 Immunodeficiency, unspecified: Secondary | ICD-10-CM | POA: Diagnosis not present

## 2022-12-01 DIAGNOSIS — D696 Thrombocytopenia, unspecified: Secondary | ICD-10-CM | POA: Diagnosis not present

## 2022-12-01 DIAGNOSIS — Z941 Heart transplant status: Secondary | ICD-10-CM | POA: Diagnosis not present

## 2022-12-01 DIAGNOSIS — J9811 Atelectasis: Secondary | ICD-10-CM | POA: Diagnosis not present

## 2022-12-01 DIAGNOSIS — T86298 Other complications of heart transplant: Secondary | ICD-10-CM | POA: Diagnosis not present

## 2022-12-01 DIAGNOSIS — I519 Heart disease, unspecified: Secondary | ICD-10-CM | POA: Diagnosis not present

## 2022-12-01 DIAGNOSIS — R918 Other nonspecific abnormal finding of lung field: Secondary | ICD-10-CM | POA: Diagnosis not present

## 2022-12-01 DIAGNOSIS — Z452 Encounter for adjustment and management of vascular access device: Secondary | ICD-10-CM | POA: Diagnosis not present

## 2022-12-01 DIAGNOSIS — Z4682 Encounter for fitting and adjustment of non-vascular catheter: Secondary | ICD-10-CM | POA: Diagnosis not present

## 2022-12-02 DIAGNOSIS — J9811 Atelectasis: Secondary | ICD-10-CM | POA: Diagnosis not present

## 2022-12-02 DIAGNOSIS — Z941 Heart transplant status: Secondary | ICD-10-CM | POA: Diagnosis not present

## 2022-12-02 DIAGNOSIS — Z794 Long term (current) use of insulin: Secondary | ICD-10-CM | POA: Diagnosis not present

## 2022-12-02 DIAGNOSIS — E1165 Type 2 diabetes mellitus with hyperglycemia: Secondary | ICD-10-CM | POA: Diagnosis not present

## 2022-12-02 DIAGNOSIS — Z7682 Awaiting organ transplant status: Secondary | ICD-10-CM | POA: Diagnosis not present

## 2022-12-03 ENCOUNTER — Ambulatory Visit: Payer: Medicaid Other

## 2022-12-03 DIAGNOSIS — J90 Pleural effusion, not elsewhere classified: Secondary | ICD-10-CM | POA: Diagnosis not present

## 2022-12-03 DIAGNOSIS — E1165 Type 2 diabetes mellitus with hyperglycemia: Secondary | ICD-10-CM | POA: Diagnosis not present

## 2022-12-03 DIAGNOSIS — Z941 Heart transplant status: Secondary | ICD-10-CM | POA: Diagnosis not present

## 2022-12-03 DIAGNOSIS — Z794 Long term (current) use of insulin: Secondary | ICD-10-CM | POA: Diagnosis not present

## 2022-12-04 DIAGNOSIS — T8621 Heart transplant rejection: Secondary | ICD-10-CM | POA: Diagnosis not present

## 2022-12-04 DIAGNOSIS — E1165 Type 2 diabetes mellitus with hyperglycemia: Secondary | ICD-10-CM | POA: Diagnosis not present

## 2022-12-04 DIAGNOSIS — J90 Pleural effusion, not elsewhere classified: Secondary | ICD-10-CM | POA: Diagnosis not present

## 2022-12-04 DIAGNOSIS — Z941 Heart transplant status: Secondary | ICD-10-CM | POA: Diagnosis not present

## 2022-12-04 DIAGNOSIS — Z4821 Encounter for aftercare following heart transplant: Secondary | ICD-10-CM | POA: Diagnosis not present

## 2022-12-04 DIAGNOSIS — I429 Cardiomyopathy, unspecified: Secondary | ICD-10-CM | POA: Diagnosis not present

## 2022-12-04 DIAGNOSIS — J939 Pneumothorax, unspecified: Secondary | ICD-10-CM | POA: Diagnosis not present

## 2022-12-04 DIAGNOSIS — Z794 Long term (current) use of insulin: Secondary | ICD-10-CM | POA: Diagnosis not present

## 2022-12-04 DIAGNOSIS — Z7682 Awaiting organ transplant status: Secondary | ICD-10-CM | POA: Diagnosis not present

## 2022-12-04 DIAGNOSIS — J9811 Atelectasis: Secondary | ICD-10-CM | POA: Diagnosis not present

## 2022-12-05 ENCOUNTER — Ambulatory Visit: Payer: Medicaid Other | Admitting: Internal Medicine

## 2022-12-05 DIAGNOSIS — Z941 Heart transplant status: Secondary | ICD-10-CM | POA: Diagnosis not present

## 2022-12-05 DIAGNOSIS — J90 Pleural effusion, not elsewhere classified: Secondary | ICD-10-CM | POA: Diagnosis not present

## 2022-12-05 DIAGNOSIS — E1165 Type 2 diabetes mellitus with hyperglycemia: Secondary | ICD-10-CM | POA: Diagnosis not present

## 2022-12-05 DIAGNOSIS — Z794 Long term (current) use of insulin: Secondary | ICD-10-CM | POA: Diagnosis not present

## 2022-12-05 DIAGNOSIS — R1312 Dysphagia, oropharyngeal phase: Secondary | ICD-10-CM | POA: Diagnosis not present

## 2022-12-11 ENCOUNTER — Telehealth (HOSPITAL_COMMUNITY): Payer: Self-pay

## 2022-12-11 NOTE — Telephone Encounter (Signed)
Pt insurance is active through [place]. Ref# [id number]   Will fax over Medicaid Reimbursement form to Dr. Edwena Blow

## 2022-12-12 DIAGNOSIS — R21 Rash and other nonspecific skin eruption: Secondary | ICD-10-CM | POA: Diagnosis not present

## 2022-12-12 DIAGNOSIS — R739 Hyperglycemia, unspecified: Secondary | ICD-10-CM | POA: Diagnosis not present

## 2022-12-12 DIAGNOSIS — Z7984 Long term (current) use of oral hypoglycemic drugs: Secondary | ICD-10-CM | POA: Diagnosis not present

## 2022-12-12 DIAGNOSIS — Z2989 Encounter for other specified prophylactic measures: Secondary | ICD-10-CM | POA: Diagnosis not present

## 2022-12-12 DIAGNOSIS — Z7982 Long term (current) use of aspirin: Secondary | ICD-10-CM | POA: Diagnosis not present

## 2022-12-12 DIAGNOSIS — Z8673 Personal history of transient ischemic attack (TIA), and cerebral infarction without residual deficits: Secondary | ICD-10-CM | POA: Diagnosis not present

## 2022-12-12 DIAGNOSIS — Z941 Heart transplant status: Secondary | ICD-10-CM | POA: Diagnosis not present

## 2022-12-12 DIAGNOSIS — T380X5A Adverse effect of glucocorticoids and synthetic analogues, initial encounter: Secondary | ICD-10-CM | POA: Diagnosis not present

## 2022-12-12 DIAGNOSIS — Z79899 Other long term (current) drug therapy: Secondary | ICD-10-CM | POA: Diagnosis not present

## 2022-12-12 DIAGNOSIS — D84821 Immunodeficiency due to drugs: Secondary | ICD-10-CM | POA: Diagnosis not present

## 2022-12-12 DIAGNOSIS — Z48298 Encounter for aftercare following other organ transplant: Secondary | ICD-10-CM | POA: Diagnosis not present

## 2022-12-12 DIAGNOSIS — Z79624 Long term (current) use of inhibitors of nucleotide synthesis: Secondary | ICD-10-CM | POA: Diagnosis not present

## 2022-12-12 DIAGNOSIS — Z4821 Encounter for aftercare following heart transplant: Secondary | ICD-10-CM | POA: Diagnosis not present

## 2022-12-12 DIAGNOSIS — I119 Hypertensive heart disease without heart failure: Secondary | ICD-10-CM | POA: Diagnosis not present

## 2022-12-12 DIAGNOSIS — Z79621 Long term (current) use of calcineurin inhibitor: Secondary | ICD-10-CM | POA: Diagnosis not present

## 2022-12-12 DIAGNOSIS — D849 Immunodeficiency, unspecified: Secondary | ICD-10-CM | POA: Diagnosis not present

## 2022-12-18 ENCOUNTER — Encounter (HOSPITAL_COMMUNITY): Payer: Self-pay | Admitting: Cardiology

## 2022-12-18 ENCOUNTER — Ambulatory Visit (HOSPITAL_COMMUNITY)
Admission: RE | Admit: 2022-12-18 | Discharge: 2022-12-18 | Disposition: A | Discharge status: Home / Self Care | Payer: Medicaid Other | Source: Ambulatory Visit | Attending: Cardiology | Admitting: Cardiology

## 2022-12-18 VITALS — Wt 173.6 lb

## 2022-12-18 DIAGNOSIS — I5022 Chronic systolic (congestive) heart failure: Secondary | ICD-10-CM

## 2022-12-18 NOTE — Progress Notes (Signed)
Social visit with patient post-heart transplant.  He is doing well, followed at [facility] now post-transplant.  We will help as needed.

## 2022-12-18 NOTE — Progress Notes (Signed)
Received patient into room. Patient post heart transplant. Patient verbalizing he is feeling good.

## 2022-12-19 ENCOUNTER — Ambulatory Visit: Payer: Medicaid Other | Admitting: Internal Medicine

## 2022-12-19 DIAGNOSIS — R9431 Abnormal electrocardiogram [ECG] [EKG]: Secondary | ICD-10-CM | POA: Diagnosis not present

## 2022-12-19 DIAGNOSIS — E1165 Type 2 diabetes mellitus with hyperglycemia: Secondary | ICD-10-CM | POA: Diagnosis not present

## 2022-12-19 DIAGNOSIS — Z79621 Long term (current) use of calcineurin inhibitor: Secondary | ICD-10-CM | POA: Diagnosis not present

## 2022-12-19 DIAGNOSIS — E875 Hyperkalemia: Secondary | ICD-10-CM | POA: Diagnosis not present

## 2022-12-19 DIAGNOSIS — Z8616 Personal history of COVID-19: Secondary | ICD-10-CM | POA: Diagnosis not present

## 2022-12-19 DIAGNOSIS — Z23 Encounter for immunization: Secondary | ICD-10-CM | POA: Diagnosis not present

## 2022-12-19 DIAGNOSIS — Z794 Long term (current) use of insulin: Secondary | ICD-10-CM | POA: Diagnosis not present

## 2022-12-19 DIAGNOSIS — Z48298 Encounter for aftercare following other organ transplant: Secondary | ICD-10-CM | POA: Diagnosis not present

## 2022-12-19 DIAGNOSIS — F05 Delirium due to known physiological condition: Secondary | ICD-10-CM | POA: Diagnosis not present

## 2022-12-19 DIAGNOSIS — Z7982 Long term (current) use of aspirin: Secondary | ICD-10-CM | POA: Diagnosis not present

## 2022-12-19 DIAGNOSIS — Z2989 Encounter for other specified prophylactic measures: Secondary | ICD-10-CM | POA: Diagnosis not present

## 2022-12-19 DIAGNOSIS — Z7984 Long term (current) use of oral hypoglycemic drugs: Secondary | ICD-10-CM | POA: Diagnosis not present

## 2022-12-19 DIAGNOSIS — Z79624 Long term (current) use of inhibitors of nucleotide synthesis: Secondary | ICD-10-CM | POA: Diagnosis not present

## 2022-12-19 DIAGNOSIS — Z941 Heart transplant status: Secondary | ICD-10-CM | POA: Diagnosis not present

## 2022-12-19 DIAGNOSIS — E782 Mixed hyperlipidemia: Secondary | ICD-10-CM | POA: Diagnosis not present

## 2022-12-19 DIAGNOSIS — D84821 Immunodeficiency due to drugs: Secondary | ICD-10-CM | POA: Diagnosis not present

## 2022-12-19 DIAGNOSIS — T380X5A Adverse effect of glucocorticoids and synthetic analogues, initial encounter: Secondary | ICD-10-CM | POA: Diagnosis not present

## 2022-12-19 DIAGNOSIS — R739 Hyperglycemia, unspecified: Secondary | ICD-10-CM | POA: Diagnosis not present

## 2022-12-19 DIAGNOSIS — D849 Immunodeficiency, unspecified: Secondary | ICD-10-CM | POA: Diagnosis not present

## 2022-12-19 DIAGNOSIS — G40909 Epilepsy, unspecified, not intractable, without status epilepticus: Secondary | ICD-10-CM | POA: Diagnosis not present

## 2022-12-19 DIAGNOSIS — Z79899 Other long term (current) drug therapy: Secondary | ICD-10-CM | POA: Diagnosis not present

## 2022-12-19 DIAGNOSIS — I10 Essential (primary) hypertension: Secondary | ICD-10-CM | POA: Diagnosis not present

## 2022-12-20 ENCOUNTER — Telehealth: Payer: Self-pay | Admitting: *Deleted

## 2022-12-20 DIAGNOSIS — E1165 Type 2 diabetes mellitus with hyperglycemia: Secondary | ICD-10-CM | POA: Diagnosis not present

## 2022-12-20 DIAGNOSIS — E875 Hyperkalemia: Secondary | ICD-10-CM | POA: Diagnosis not present

## 2022-12-20 DIAGNOSIS — R739 Hyperglycemia, unspecified: Secondary | ICD-10-CM | POA: Diagnosis not present

## 2022-12-20 DIAGNOSIS — Z941 Heart transplant status: Secondary | ICD-10-CM | POA: Diagnosis not present

## 2022-12-20 DIAGNOSIS — E1159 Type 2 diabetes mellitus with other circulatory complications: Secondary | ICD-10-CM | POA: Diagnosis not present

## 2022-12-20 DIAGNOSIS — D849 Immunodeficiency, unspecified: Secondary | ICD-10-CM | POA: Diagnosis not present

## 2022-12-20 NOTE — Telephone Encounter (Signed)
Pt currently admitted to [facility] for care. Will follow up later for Anticoagulation Appt.

## 2022-12-21 DIAGNOSIS — D899 Disorder involving the immune mechanism, unspecified: Secondary | ICD-10-CM | POA: Diagnosis not present

## 2022-12-21 DIAGNOSIS — I10 Essential (primary) hypertension: Secondary | ICD-10-CM | POA: Diagnosis not present

## 2022-12-21 DIAGNOSIS — Z794 Long term (current) use of insulin: Secondary | ICD-10-CM | POA: Diagnosis not present

## 2022-12-21 DIAGNOSIS — Z8669 Personal history of other diseases of the nervous system and sense organs: Secondary | ICD-10-CM | POA: Diagnosis not present

## 2022-12-21 DIAGNOSIS — Z941 Heart transplant status: Secondary | ICD-10-CM | POA: Diagnosis not present

## 2022-12-21 DIAGNOSIS — E119 Type 2 diabetes mellitus without complications: Secondary | ICD-10-CM | POA: Diagnosis not present

## 2022-12-24 ENCOUNTER — Telehealth: Payer: Self-pay

## 2022-12-24 NOTE — Telephone Encounter (Signed)
Pt called letting us know he got an heart transplant and no longer has a device. I congratulated the patient  and let him know I will cancel all remotes and put a note in his chart to let everyone know so he do not get anymore calls or letters.

## 2022-12-25 ENCOUNTER — Telehealth (HOSPITAL_COMMUNITY): Payer: Self-pay | Admitting: Licensed Clinical Social Worker

## 2022-12-25 DIAGNOSIS — R9431 Abnormal electrocardiogram [ECG] [EKG]: Secondary | ICD-10-CM | POA: Diagnosis not present

## 2022-12-25 DIAGNOSIS — Z941 Heart transplant status: Secondary | ICD-10-CM | POA: Diagnosis not present

## 2022-12-25 DIAGNOSIS — Z4821 Encounter for aftercare following heart transplant: Secondary | ICD-10-CM | POA: Diagnosis not present

## 2022-12-25 DIAGNOSIS — R519 Headache, unspecified: Secondary | ICD-10-CM | POA: Diagnosis not present

## 2022-12-25 DIAGNOSIS — Z0389 Encounter for observation for other suspected diseases and conditions ruled out: Secondary | ICD-10-CM | POA: Diagnosis not present

## 2022-12-25 NOTE — Telephone Encounter (Signed)
H&V Care Navigation CSW Progress Note  Clinical Social Worker contacted by patient to inquire about assistance with overdue bills.  States that when pt got transplant last month his wife had to take upaid leave and it has put them behind on bills.  Rent is about 833/month- pt only has about $480 left on his patient care fund account- they confirm they can manage the remainder.  Informed them of requirement documents- they will work on obtaining and get back with me.  Patient is participating in a Managed Medicaid Plan:  Yes  SDOH Screenings   Food Insecurity: No Food Insecurity (09/14/2020)  Recent Concern: Food Insecurity - Food Insecurity Present (09/13/2020)  Housing: Medium Risk (01/28/2022)  Transportation Needs: No Transportation Needs (12/20/2022)   Received from [facility]  Utilities: At Risk (09/20/2022)  Depression (PHQ2-9): Low Risk  (08/08/2022)  Financial Resource Strain: High Risk (10/18/2021)  Social Connections: Unknown (08/13/2021)   Received from [facility]  Tobacco Use: Low Risk  (12/25/2022)   Received from [facility]    Burna Sis, LCSW Clinical Social Worker [facility] Desk#: [phone] Cell#: [phone]

## 2022-12-26 DIAGNOSIS — Z8669 Personal history of other diseases of the nervous system and sense organs: Secondary | ICD-10-CM | POA: Diagnosis not present

## 2022-12-26 DIAGNOSIS — R739 Hyperglycemia, unspecified: Secondary | ICD-10-CM | POA: Diagnosis not present

## 2022-12-26 DIAGNOSIS — Z79899 Other long term (current) drug therapy: Secondary | ICD-10-CM | POA: Diagnosis not present

## 2022-12-26 DIAGNOSIS — E878 Other disorders of electrolyte and fluid balance, not elsewhere classified: Secondary | ICD-10-CM | POA: Diagnosis not present

## 2022-12-26 DIAGNOSIS — D84821 Immunodeficiency due to drugs: Secondary | ICD-10-CM | POA: Diagnosis not present

## 2022-12-26 DIAGNOSIS — Z4821 Encounter for aftercare following heart transplant: Secondary | ICD-10-CM | POA: Diagnosis not present

## 2022-12-26 DIAGNOSIS — Z0271 Encounter for disability determination: Secondary | ICD-10-CM

## 2022-12-26 DIAGNOSIS — Z48298 Encounter for aftercare following other organ transplant: Secondary | ICD-10-CM | POA: Diagnosis not present

## 2022-12-26 DIAGNOSIS — D849 Immunodeficiency, unspecified: Secondary | ICD-10-CM | POA: Diagnosis not present

## 2022-12-26 DIAGNOSIS — I119 Hypertensive heart disease without heart failure: Secondary | ICD-10-CM | POA: Diagnosis not present

## 2022-12-26 DIAGNOSIS — Z7982 Long term (current) use of aspirin: Secondary | ICD-10-CM | POA: Diagnosis not present

## 2022-12-26 DIAGNOSIS — Z794 Long term (current) use of insulin: Secondary | ICD-10-CM | POA: Diagnosis not present

## 2022-12-26 DIAGNOSIS — Z79621 Long term (current) use of calcineurin inhibitor: Secondary | ICD-10-CM | POA: Diagnosis not present

## 2022-12-26 DIAGNOSIS — Z8673 Personal history of transient ischemic attack (TIA), and cerebral infarction without residual deficits: Secondary | ICD-10-CM | POA: Diagnosis not present

## 2022-12-26 DIAGNOSIS — R21 Rash and other nonspecific skin eruption: Secondary | ICD-10-CM | POA: Diagnosis not present

## 2022-12-26 DIAGNOSIS — Z941 Heart transplant status: Secondary | ICD-10-CM | POA: Diagnosis not present

## 2022-12-26 DIAGNOSIS — I10 Essential (primary) hypertension: Secondary | ICD-10-CM | POA: Diagnosis not present

## 2022-12-26 DIAGNOSIS — Z79624 Long term (current) use of inhibitors of nucleotide synthesis: Secondary | ICD-10-CM | POA: Diagnosis not present

## 2022-12-26 DIAGNOSIS — Z2989 Encounter for other specified prophylactic measures: Secondary | ICD-10-CM | POA: Diagnosis not present

## 2023-01-03 DIAGNOSIS — E1165 Type 2 diabetes mellitus with hyperglycemia: Secondary | ICD-10-CM | POA: Diagnosis not present

## 2023-01-03 DIAGNOSIS — Z794 Long term (current) use of insulin: Secondary | ICD-10-CM | POA: Diagnosis not present

## 2023-01-09 DIAGNOSIS — Z4821 Encounter for aftercare following heart transplant: Secondary | ICD-10-CM | POA: Diagnosis not present

## 2023-01-09 DIAGNOSIS — Z941 Heart transplant status: Secondary | ICD-10-CM | POA: Diagnosis not present

## 2023-01-09 DIAGNOSIS — E119 Type 2 diabetes mellitus without complications: Secondary | ICD-10-CM | POA: Diagnosis not present

## 2023-01-09 DIAGNOSIS — Z2989 Encounter for other specified prophylactic measures: Secondary | ICD-10-CM | POA: Diagnosis not present

## 2023-01-09 DIAGNOSIS — Z79899 Other long term (current) drug therapy: Secondary | ICD-10-CM | POA: Diagnosis not present

## 2023-01-09 DIAGNOSIS — I119 Hypertensive heart disease without heart failure: Secondary | ICD-10-CM | POA: Diagnosis not present

## 2023-01-09 DIAGNOSIS — Z794 Long term (current) use of insulin: Secondary | ICD-10-CM | POA: Diagnosis not present

## 2023-01-09 DIAGNOSIS — Z79621 Long term (current) use of calcineurin inhibitor: Secondary | ICD-10-CM | POA: Diagnosis not present

## 2023-01-09 DIAGNOSIS — D84821 Immunodeficiency due to drugs: Secondary | ICD-10-CM | POA: Diagnosis not present

## 2023-01-09 DIAGNOSIS — R251 Tremor, unspecified: Secondary | ICD-10-CM | POA: Diagnosis not present

## 2023-01-09 DIAGNOSIS — T8621 Heart transplant rejection: Secondary | ICD-10-CM | POA: Diagnosis not present

## 2023-01-09 DIAGNOSIS — Z48298 Encounter for aftercare following other organ transplant: Secondary | ICD-10-CM | POA: Diagnosis not present

## 2023-01-09 DIAGNOSIS — I10 Essential (primary) hypertension: Secondary | ICD-10-CM | POA: Diagnosis not present

## 2023-01-21 ENCOUNTER — Ambulatory Visit: Payer: Medicaid Other | Admitting: Internal Medicine

## 2023-01-21 ENCOUNTER — Encounter: Payer: Self-pay | Admitting: Internal Medicine

## 2023-01-21 VITALS — BP 120/70 | HR 98 | Ht 71.0 in | Wt 171.8 lb

## 2023-01-21 DIAGNOSIS — E785 Hyperlipidemia, unspecified: Secondary | ICD-10-CM

## 2023-01-21 DIAGNOSIS — E1065 Type 1 diabetes mellitus with hyperglycemia: Secondary | ICD-10-CM

## 2023-01-21 DIAGNOSIS — E139 Other specified diabetes mellitus without complications: Secondary | ICD-10-CM

## 2023-01-21 DIAGNOSIS — E1159 Type 2 diabetes mellitus with other circulatory complications: Secondary | ICD-10-CM

## 2023-01-21 NOTE — Patient Instructions (Addendum)
Please use the following regimen: Lantus 28 units daily in am Humalog: 12-16 units before B 14-18 units before L and D 5-10 units before snack SSI Humalog: 201-250: +2 units 251-300: +4 301-350: +6 >350: +8  Try to walk every day.  Please return at your convenience.

## 2023-01-21 NOTE — Progress Notes (Signed)
Patient ID: Jason Stevenson, male   DOB: [birth year], 37 y.o.   MRN: [id number]  HPI: Jason Stevenson is a 37 y.o.-year-old male, initially referred by his cardiologist, Dr. Shirlee Latch, returning for follow-up for DM, dx in 2011, but as LADA in 2022, insulin-dependent, uncontrolled, with complications (ischemic cardiomyopathy complicated by systolic CHF, history of CVA -after Covid19 infection, peripheral neuropathy, ED).  Last visit 6 months ago.  Interim history: He had his heart transplant 11/2022!  Qsymia suppressants are adjusted right now.  Currently on prednisone 12.5 mg daily in the morning.  Sugars are still high, especially as he is now off metformin and Farxiga, also. He was admitted with hyperglycemia at [facility] 12/21/2022.  Glucose was in the 400s.  An HbA1c was 8.7%.  He was referred to endocrinology at [facility].  He sees Mosetta Putt, MD. He was thinking about continuing to see both of [place], but I advised him that this would be confusing and I would not recommend it.  He opted to follow-up at [facility] endocrinology for now and when discharged from there, to return to see me. No increased urination, blurry vision, nausea. No SOB. Some burning chest wall pain.  Reviewed HbA1c levels: 12/20/2022: HbA1c 8.7% Lab Results  Component Value Date   HGBA1C 7.6 (H) 10/22/2022   HGBA1C 9.2 (A) 07/29/2022   HGBA1C 7.6 (A) 03/21/2022   HGBA1C 8.7 (A) 11/15/2021   HGBA1C 7.7 (H) 04/19/2021   HGBA1C 8.2 (H) 01/29/2021   HGBA1C 8.3 (H) 12/14/2020   HGBA1C >15.5 (H) 09/12/2020   HGBA1C >15.5 (H) 11/26/2019   HGBA1C 14.2 (A) 04/26/2019  06/18/2021: HbA1c 7.8%  Previously on: - Metformin 500 mg 2x a day, with meals - Farxiga 5 mg before b'fast - Novolin 70/30 30 min before a meal 22-25 units before breakfast 14-16 units before dinner   We changed to: - - Tresiba 20 units daily >> Lantus 24 >> 28 units daily  - FiAsp 5-8 >> 6-10 >> but using 5-8 units before each meal and 3 to 4 units before her evening  snack >> 6-10 units before meals >> Humalog + SSI 14 units before B 16 units before L and D 5 units before snack  Pt checks his sugars >4x a day with his Freestyle Libre 2 CGM (3 was not covered) -checking with the receiver as his phone is not compatible with the sensors after an update:  Prev.:  Previously:   Lowest sugar was 67 >> 54 >> 59; he has hypoglycemia awareness at 70.  Highest sugar was 400 >> 300s >> 300s  Glucometer:TrueMetrix  Pt's meals are: - Breakfast: fruit (including banana), PB, toast, pancakes - Lunch: pasta, stir-fry with protein, sandwiches - Dinner: varies - Snacks: fruit, sometimes chips  - no CKD, last BUN/creatinine:  Lab Results  Component Value Date   BUN 16 10/25/2022   BUN 24 (H) 06/28/2022   CREATININE 0.95 10/25/2022   CREATININE 0.94 06/28/2022   01/03/2023: ACR 5.3 Lab Results  Component Value Date   MICRALBCREAT 7 06/03/2022   MICRALBCREAT 6 08/04/2019   MICRALBCREAT <3 07/02/2018   MICRALBCREAT 3.4 08/12/2017   MICRALBCREAT 4 04/17/2016  On Entresto 24/26.  -+ HL; last set of lipids: Lab Results  Component Value Date   CHOL 175 04/30/2022   HDL 61 04/30/2022   LDLCALC 98 04/30/2022   TRIG 82 04/30/2022   CHOLHDL 2.9 04/30/2022  On atorvastatin 80 mg daily, ezetimibe 10 mg daily. Also, Repatha.  - last eye exam was 01/01/2022.  No DR. At last seen, after he got insurance, he is planning to schedule this.  - + numbness and tingling in his feet. He was currently referred to a pain clinic and also started on Lyrica, Cymbalta.  Last foot exam 10/25/2022. He had ABIs on 06/22/2021: Normal.  Pt has FH of DM in mother and sister.  He also has HTN.  He was evaluated at [facility] for heart transplant. He was enrolled in their cardiac physical therapy program, to gain more strength before the transplant.  However, afterwards, he was advised that he may not need a heart transplant. However, he had more tests >> still needs to get on the  transplant list. He was in the emergency room with nausea/vomiting/diarrhea on 11/27 and 12/05.  Lipase was elevated on 03/19/2022, at 58.  ROS: + see HPI  Past Medical History:  Diagnosis Date   Chronic systolic CHF (congestive heart failure) (HCC) 06/18/2015   Hemorrhagic stroke (HCC)    Hyperlipidemia    Hypertension    Immune deficiency disorder (HCC)    Nonischemic cardiomyopathy (HCC) Noted as early as 2007   Per chart review (cards consult note 2011), EF of 40% in 2007, down to 20-25% in 2011   Past Surgical History:  Procedure Laterality Date   ICD IMPLANT N/A 08/27/2021   Procedure: ICD IMPLANT;  Surgeon: Lanier Prude, MD;  Location: [facility] INVASIVE CV LAB;  Service: Cardiovascular;  Laterality: N/A;   None     RIGHT HEART CATH  06/21/2021   [facility]   RIGHT HEART CATH N/A 05/17/2022   Procedure: RIGHT HEART CATH;  Surgeon: Laurey Morale, MD;  Location: [facility] INVASIVE CV LAB;  Service: Cardiovascular;  Laterality: N/A;   RIGHT/LEFT HEART CATH AND CORONARY ANGIOGRAPHY N/A 11/02/2020   Procedure: RIGHT/LEFT HEART CATH AND CORONARY ANGIOGRAPHY;  Surgeon: Laurey Morale, MD;  Location: [facility] INVASIVE CV LAB;  Service: Cardiovascular;  Laterality: N/A;   Social History   Socioeconomic History   Marital status: Married    Spouse name: [name]   Number of children: 0   Years of education: 13   Highest education level: Some college, no degree  Occupational History   Occupation: unemployed  Tobacco Use   Smoking status: Never   Smokeless tobacco: Never  Vaping Use   Vaping status: Not on file  Substance and Sexual Activity   Alcohol use: Not Currently    Comment: "occasional" when "hanging out with the wrong people" No recent use.   Drug use: Not Currently    Types: Marijuana    Comment: occasional, last 2013   Sexual activity: Not Currently  Other Topics Concern   Not on file  Social History Narrative   Pt lives at home with his wife    Right handed     Caffeine- hardly any   Social Determinants of Health   Financial Resource Strain: High Risk (10/18/2021)   Overall Financial Resource Strain (CARDIA)    Difficulty of Paying Living Expenses: Hard  Food Insecurity: No Food Insecurity (01/03/2023)   Received from [facility]   Hunger Vital Sign    Worried About Running Out of Food in the Last Year: Never true    Ran Out of Food in the Last Year: Never true  Transportation Needs: No Transportation Needs (12/20/2022)   Received from [facility] - Transportation    In the past 12 months, has lack of transportation kept you from medical  appointments or from getting medications?: No    Lack of Transportation (Non-Medical): No  Physical Activity: Not on file  Stress: Not on file  Social Connections: Unknown (08/13/2021)   Received from [facility]   Social Network    Social Network: Not on file  Intimate Partner Violence: Unknown (07/17/2021)   Received from [facility]   HITS    Physically Hurt: Not on file    Insult or Talk Down To: Not on file    Threaten Physical Harm: Not on file    Scream or Curse: Not on file   Current Outpatient Medications on File Prior to Visit  Medication Sig Dispense Refill   ACCU-CHEK GUIDE test strip USE TO CHECK BLOOD SUGAR THREE TIMES DAILY. 100 each 2   acetaminophen (TYLENOL) 325 MG tablet Take 1-2 tablets (325-650 mg total) by mouth every 4 (four) hours as needed for mild pain.     atorvastatin (LIPITOR) 80 MG tablet TAKE 1 TABLET (80 MG TOTAL) BY MOUTH DAILY (EVENING) 90 tablet 1   Blood Glucose Monitoring Suppl (ACCU-CHEK GUIDE ME) w/Device KIT USE TO CHECK BLOOD SUGAR THREE TIMES DAILY. 1 kit 0   Blood Pressure Monitor DEVI Use as directed to check home blood pressure 2-3 times a week (Patient not taking: Reported on 09/12/2022) 1 Device 0   Continuous Glucose Sensor (FREESTYLE LIBRE 2 SENSOR) MISC USE EVERY 14 (FOURTEEN) DAYS. 6  each 1   CORLANOR 5 MG TABS tablet TAKE 1 TABLET (5 MG TOTAL) BY MOUTH 2 (TWO) TIMES DAILY WITH A MEAL (AM+EVENING) 60 tablet 6   dapagliflozin propanediol (FARXIGA) 5 MG TABS tablet TAKE 1 TABLET (5 MG TOTAL) BY MOUTH DAILY BEFORE BREAKFAST. 30 tablet 11   digoxin (LANOXIN) 0.125 MG tablet TAKE 1 TABLET (0.125 MG TOTAL) BY MOUTH DAILY (AM) 30 tablet 6   DULoxetine (CYMBALTA) 30 MG capsule Take 60 mg by mouth daily. Dose increase by psychiatry     EASY COMFORT PEN NEEDLES 32G X 4 MM MISC USE TO INJECT INSULIN FOUR TIMES A DAY 200 each 2   Evolocumab (REPATHA SURECLICK) 140 MG/ML SOAJ Inject 140 mg into the skin every 14 (fourteen) days. 2 mL 11   ezetimibe (ZETIA) 10 MG tablet TAKE 1 TABLET (10 MG TOTAL) BY MOUTH DAILY (AM) 90 tablet 3   furosemide (LASIX) 20 MG tablet TAKE 1 TABLET (20 MG TOTAL) BY MOUTH 3 (THREE) TIMES A WEEK. MONDAYS WEDNESDAYS AND FRIDAY 30 tablet 11   insulin aspart (FIASP FLEXTOUCH) 100 UNIT/ML FlexTouch Pen Inject 5-8 Units into the skin 3 (three) times daily before meals. 30 mL 3   insulin degludec (TRESIBA FLEXTOUCH) 200 UNIT/ML FlexTouch Pen Inject 20 Units into the skin daily. 18 mL 3   Insulin Syringe-Needle U-100 (RELION INSULIN SYRINGE) 31G X 15/64" 0.3 ML MISC Use to inject insulin daily. 100 each 11   levETIRAcetam (KEPPRA) 750 MG tablet TAKE 2 TABLETS BY MOUTH 2 (TWO) TIMES DAILY (2AM+ 2EVENING) 360 tablet 1   loperamide (IMODIUM) 2 MG capsule Take 1 capsule (2 mg total) by mouth 4 (four) times daily as needed for diarrhea or loose stools. 12 capsule 0   Menthol, Topical Analgesic, (ICY HOT BACK EX) Apply 1 application  topically daily as needed (neuropathy). Icy hot cream     metFORMIN (GLUCOPHAGE-XR) 500 MG 24 hr tablet TAKE 1 TABLET (500 MG TOTAL) BY MOUTH 2 (TWO) TIMES DAILY WITH A MEAL (AM+EVENING) 180 tablet 1   metoprolol succinate (  TOPROL-XL) 100 MG 24 hr tablet TAKE 1 TABLET BY MOUTH DAILY WITH OR IMMEDIATELY FOLLOWING A MEAL (EVENING) 100 tablet 3    Oxcarbazepine (TRILEPTAL) 300 MG tablet TAKE 2 TABLETS (600 MG TOTAL) BY MOUTH 2 (TWO) TIMES DAILY (AM+EVENING) 360 tablet 1   potassium chloride SA (KLOR-CON M) 20 MEQ tablet TAKE 1 TABLET (20 MEQ TOTAL) BY MOUTH 3 (THREE) TIMES A WEEK. TAKE EVERY MON, WED AND FRI WITH FUROSEMIDE 30 tablet 4   pregabalin (LYRICA) 300 MG capsule TAKE 1 CAPSULE (300 MG TOTAL) BY MOUTH AT BEDTIME. 90 capsule 1   sacubitril-valsartan (ENTRESTO) 49-51 MG Take 1 tablet by mouth 2 (two) times daily. 60 tablet 8   sildenafil (VIAGRA) 50 MG tablet 1 tab p.o. half hour prior to intercourse PRN.  Limit use to 1 tab/24 hr 10 tablet 6   spironolactone (ALDACTONE) 25 MG tablet TAKE 1 TABLET (25 MG TOTAL) BY MOUTH AT BEDTIME. 90 tablet 1   warfarin (COUMADIN) 5 MG tablet TAKE 1 TO 1 & 1/2 TABLETS (5 TO 7.5 MG TOTAL) BY MOUTH DAILY. 40 tablet 3   No current facility-administered medications on file prior to visit.   No Known Allergies Family History  Problem Relation Age of Onset   Stroke Mother    Diabetes Mother    Hypertension Mother    Stroke Maternal Aunt    Heart attack Neg Hx    PE: There were no vitals taken for this visit. Wt Readings from Last 3 Encounters:  12/18/22 173 lb 9.6 oz (78.7 kg)  10/25/22 167 lb 12.8 oz (76.1 kg)  09/12/22 160 lb 3.2 oz (72.7 kg)   Constitutional: normal weight, in NAD Eyes: no exophthalmos ENT: no masses palpated in neck, no cervical lymphadenopathy Cardiovascular: RRR, No MRG Respiratory: CTA B Musculoskeletal: no deformities Skin:  no rashes Neurological: no tremor with outstretched hands  ASSESSMENT: 1. LADA, insulin-dependent, uncontrolled, with complications - iCMP, systolic CHF, with LV thrombus - on Coumadin - h/o CVA 08/2020 - PN - ED - on Viagra  Component     Latest Ref Rng & Units 02/05/2021  Glutamic Acid Decarb Ab     0.0 - 5.0 U/mL 20.6 (H)  Islet Cell Ab     Neg:<1:1 Negative   Component     Latest Ref Rng & Units 04/19/2021  C-Peptide     0.80  - 3.85 ng/mL 0.49 (L)  Glucose     65 - 99 mg/dL 78   His C-peptide is low, confirming insulin deficiency >> he has a diagnosis of LADA, behaving more like type 1 diabetes.  2. HL  PLAN:  1. Patient with long standing, uncontrolled, LADA, insulin-dependent, with still poor control.  He recently had an HbA1c that was higher, at 8.7% on 12/20/2022. -At last visit, sugars were much better and HbA1c was improved to 7.2%.  Since then, he was admitted with hyperglycemia at [facility] on 12/21/2022, with glucose in the 400s.  His insulin regimen was adjusted.  He will need an HbA1c lower than 8 for cardiac transplant. CGM interpretation: -At today's visit, we reviewed his CGM downloads: It appears that 64% of values are in target range (goal >70%), while 35% are higher than 180 (goal <25%), and 1% are lower than 70 (goal <4%).  The calculated average blood sugar is 165.  The projected HbA1c for the next 3 months (GMI) is 7.3%. -Reviewing the CGM trends, sugars are quite fluctuating, with significant hyperglycemic peaks, up to 300s at different  times of the day, without a consistent pattern.  He had days in which his sugars were within target of 100% of the time, and upon questioning he was walking during those days.  I advised him to try to walk every day.  The transplant team already recommended to walk 15 hours a day.  Also, we discussed that he needed to hold his Humalog before every meal but I did advise him to vary the dose of Humalog based on the size and consistency of his meals.  Also, he had very elevated blood sugars after repeated snacking and I advised him that he may need to repeat it bolusing for snacking and he may also need higher doses for these.  Otherwise, I did not recommend a change in regimen. - I suggested to:  Patient Instructions  Please use the following regimen: Lantus 28 units daily in am Humalog: 12-16 units before B 14-18 units before L and D 5-10 units before snack SSI  Humalog: 201-250: +2 units 251-300: +4 301-350: +6 >350: +8  Try to walk every day.  Please return at your convenience.  - advised to check sugars at different times of the day - 4x a day, rotating check times - advised for yearly eye exams >> he is not UTD - return to clinic at his convenience  2. HL -Reviewed latest lipid panel from 04/2022: Fractions at goal except for LDL above our target of less than 55 due to cardiovascular disease. Lab Results  Component Value Date   CHOL 175 04/30/2022   HDL 61 04/30/2022   LDLCALC 98 04/30/2022   TRIG 82 04/30/2022   CHOLHDL 2.9 04/30/2022  -He is on atorvastatin 80 mg daily, Zetia 10 mg daily and Repatha  Carlus Pavlov, MD PhD [facility] Endocrinology

## 2023-01-22 ENCOUNTER — Telehealth (HOSPITAL_COMMUNITY): Payer: Self-pay

## 2023-01-22 NOTE — Telephone Encounter (Signed)
Jason Stevenson with [facility] called about the VM left for the dept. She then gave me Dr. Roselyn Reef assistants number and transferred me. I then spoke with Thayer Ohm Dr. Roselyn Reef assistants she informed me that they never received the fax that the fax number we had was to a cardiologist. She gave me their fax number to resend the forms.  Office #: [phone] Fax#: [phone]   Will fax refax medicaid form and MD Order to their fax number.   Will place referral back into MD order folder

## 2023-01-22 NOTE — Telephone Encounter (Signed)
Will refax medicaid and MD Order form.    Placed back in medicaid folder.

## 2023-01-22 NOTE — Telephone Encounter (Signed)
Called Cardiothoracic surgery dept. LM on VM.

## 2023-01-23 DIAGNOSIS — R251 Tremor, unspecified: Secondary | ICD-10-CM | POA: Diagnosis not present

## 2023-01-23 DIAGNOSIS — Z941 Heart transplant status: Secondary | ICD-10-CM | POA: Diagnosis not present

## 2023-01-23 DIAGNOSIS — Z794 Long term (current) use of insulin: Secondary | ICD-10-CM | POA: Diagnosis not present

## 2023-01-23 DIAGNOSIS — D849 Immunodeficiency, unspecified: Secondary | ICD-10-CM | POA: Diagnosis not present

## 2023-01-23 DIAGNOSIS — Z2989 Encounter for other specified prophylactic measures: Secondary | ICD-10-CM | POA: Diagnosis not present

## 2023-01-23 DIAGNOSIS — D84821 Immunodeficiency due to drugs: Secondary | ICD-10-CM | POA: Diagnosis not present

## 2023-01-23 DIAGNOSIS — Z48298 Encounter for aftercare following other organ transplant: Secondary | ICD-10-CM | POA: Diagnosis not present

## 2023-01-23 DIAGNOSIS — Z4821 Encounter for aftercare following heart transplant: Secondary | ICD-10-CM | POA: Diagnosis not present

## 2023-01-23 DIAGNOSIS — Z8673 Personal history of transient ischemic attack (TIA), and cerebral infarction without residual deficits: Secondary | ICD-10-CM | POA: Diagnosis not present

## 2023-01-23 DIAGNOSIS — E119 Type 2 diabetes mellitus without complications: Secondary | ICD-10-CM | POA: Diagnosis not present

## 2023-01-23 DIAGNOSIS — Z79899 Other long term (current) drug therapy: Secondary | ICD-10-CM | POA: Diagnosis not present

## 2023-01-23 DIAGNOSIS — I10 Essential (primary) hypertension: Secondary | ICD-10-CM | POA: Diagnosis not present

## 2023-01-23 DIAGNOSIS — Z8669 Personal history of other diseases of the nervous system and sense organs: Secondary | ICD-10-CM | POA: Diagnosis not present

## 2023-01-27 DIAGNOSIS — F331 Major depressive disorder, recurrent, moderate: Secondary | ICD-10-CM | POA: Diagnosis not present

## 2023-01-27 DIAGNOSIS — F411 Generalized anxiety disorder: Secondary | ICD-10-CM | POA: Diagnosis not present

## 2023-01-31 ENCOUNTER — Other Ambulatory Visit: Payer: Self-pay | Admitting: Internal Medicine

## 2023-01-31 NOTE — Telephone Encounter (Signed)
Requested Prescriptions  Pending Prescriptions Disp Refills   EASY COMFORT PEN NEEDLES 32G X 4 MM MISC [Pharmacy Med Name: EASY COMFORT PEN NEEDLES 32G X 4 MM] 200 each 2    Sig: USE TO INJECT INSULIN FOUR TIMES A DAY     Endocrinology: Diabetes - Testing Supplies Passed - 01/31/2023  3:16 PM      Passed - Valid encounter within last 12 months    Recent Outpatient Visits           3 months ago Type 2 diabetes mellitus with diabetic polyneuropathy, with long-term current use of insulin (HCC)   [place] [facility] [name] B, MD   5 months ago Type 2 diabetes mellitus with diabetic polyneuropathy, with long-term current use of insulin (HCC)   [place] [facility] [name] B, MD   8 months ago Type 2 diabetes mellitus with diabetic polyneuropathy, with long-term current use of insulin [facility])   [place] [facility] [name] B, MD   8 months ago Type 2 diabetes mellitus with diabetic polyneuropathy, with long-term current use of insulin [facility])   [place] [facility] [place], [place], [place]   10 months ago Erectile dysfunction associated with type 2 diabetes mellitus [facility])   [place] [facility] Marcine Matar, MD

## 2023-02-03 ENCOUNTER — Encounter: Payer: Self-pay | Admitting: Cardiology

## 2023-02-19 DIAGNOSIS — Z48298 Encounter for aftercare following other organ transplant: Secondary | ICD-10-CM | POA: Diagnosis not present

## 2023-02-19 DIAGNOSIS — D849 Immunodeficiency, unspecified: Secondary | ICD-10-CM | POA: Diagnosis not present

## 2023-02-19 DIAGNOSIS — D84821 Immunodeficiency due to drugs: Secondary | ICD-10-CM | POA: Diagnosis not present

## 2023-02-19 DIAGNOSIS — E119 Type 2 diabetes mellitus without complications: Secondary | ICD-10-CM | POA: Diagnosis not present

## 2023-02-19 DIAGNOSIS — R197 Diarrhea, unspecified: Secondary | ICD-10-CM | POA: Diagnosis not present

## 2023-02-19 DIAGNOSIS — Z794 Long term (current) use of insulin: Secondary | ICD-10-CM | POA: Diagnosis not present

## 2023-02-19 DIAGNOSIS — Z4821 Encounter for aftercare following heart transplant: Secondary | ICD-10-CM | POA: Diagnosis not present

## 2023-02-19 DIAGNOSIS — Z941 Heart transplant status: Secondary | ICD-10-CM | POA: Diagnosis not present

## 2023-02-19 DIAGNOSIS — I10 Essential (primary) hypertension: Secondary | ICD-10-CM | POA: Diagnosis not present

## 2023-02-19 DIAGNOSIS — N179 Acute kidney failure, unspecified: Secondary | ICD-10-CM | POA: Diagnosis not present

## 2023-02-19 DIAGNOSIS — Z8673 Personal history of transient ischemic attack (TIA), and cerebral infarction without residual deficits: Secondary | ICD-10-CM | POA: Diagnosis not present

## 2023-02-19 DIAGNOSIS — R251 Tremor, unspecified: Secondary | ICD-10-CM | POA: Diagnosis not present

## 2023-02-19 DIAGNOSIS — Z79624 Long term (current) use of inhibitors of nucleotide synthesis: Secondary | ICD-10-CM | POA: Diagnosis not present

## 2023-02-19 DIAGNOSIS — Z2989 Encounter for other specified prophylactic measures: Secondary | ICD-10-CM | POA: Diagnosis not present

## 2023-02-19 DIAGNOSIS — Z789 Other specified health status: Secondary | ICD-10-CM | POA: Diagnosis not present

## 2023-02-19 DIAGNOSIS — Z79899 Other long term (current) drug therapy: Secondary | ICD-10-CM | POA: Diagnosis not present

## 2023-02-26 ENCOUNTER — Telehealth (HOSPITAL_COMMUNITY): Payer: Self-pay

## 2023-02-26 ENCOUNTER — Encounter (HOSPITAL_COMMUNITY): Payer: Self-pay

## 2023-02-26 NOTE — Telephone Encounter (Signed)
Attempted to call patient in regards to Cardiac Rehab - LM on VM Mailed letter 

## 2023-02-27 ENCOUNTER — Telehealth (HOSPITAL_COMMUNITY): Payer: Self-pay

## 2023-02-27 NOTE — Telephone Encounter (Signed)
Jason Stevenson with [facility] called and stated she did recv'ed pt Medicaid form and request from recent 12 led/EKG. She will have the form completed and fax requested form over, verified [facility] CR fax number with Jason Stevenson.

## 2023-03-03 ENCOUNTER — Encounter (HOSPITAL_COMMUNITY): Payer: Self-pay

## 2023-03-03 ENCOUNTER — Telehealth (HOSPITAL_COMMUNITY): Payer: Self-pay

## 2023-03-03 DIAGNOSIS — D849 Immunodeficiency, unspecified: Secondary | ICD-10-CM | POA: Diagnosis not present

## 2023-03-03 DIAGNOSIS — Z79899 Other long term (current) drug therapy: Secondary | ICD-10-CM | POA: Diagnosis not present

## 2023-03-03 DIAGNOSIS — Z2989 Encounter for other specified prophylactic measures: Secondary | ICD-10-CM | POA: Diagnosis not present

## 2023-03-03 DIAGNOSIS — Z941 Heart transplant status: Secondary | ICD-10-CM | POA: Diagnosis not present

## 2023-03-03 NOTE — Telephone Encounter (Signed)
Attempted to call patient in regards to Cardiac Rehab - LM on VM Mailed letter 

## 2023-03-04 ENCOUNTER — Telehealth (HOSPITAL_COMMUNITY): Payer: Self-pay

## 2023-03-04 NOTE — Telephone Encounter (Signed)
Pt returned CR phone call and stated he is interested.Patient will come in for orientation on 03/06/23 @ 8AM and will attend the 8:15AM exercise class.   Pensions consultant.

## 2023-03-04 NOTE — Telephone Encounter (Signed)
Pt insurance is active and benefits verified through [facility]. Co-pay $0.00, DED $0.00/$0.00 met, out of pocket $0.00/$0.00 met, co-insurance 0%. No pre-authorization required. Tomeka R./[employer], 03/04/23 @ 11:06AM, NWG#[id number]   How many CR sessions are covered? 36 visits for TCR Is this a lifetime maximum or an annual maximum? Annual Has the member used any of these services to date? No Is there a time limit (weeks/months) on start of program and/or program completion? No

## 2023-03-06 ENCOUNTER — Encounter (HOSPITAL_COMMUNITY)
Admission: RE | Admit: 2023-03-06 | Discharge: 2023-03-06 | Disposition: A | Discharge status: Home / Self Care | Payer: Medicaid Other | Source: Ambulatory Visit | Attending: Cardiology | Admitting: Cardiology

## 2023-03-06 VITALS — BP 112/86 | HR 91 | Ht 72.5 in | Wt 176.8 lb

## 2023-03-06 DIAGNOSIS — Z941 Heart transplant status: Secondary | ICD-10-CM | POA: Insufficient documentation

## 2023-03-06 LAB — GLUCOSE, CAPILLARY: Glucose-Capillary: 219 mg/dL — ABNORMAL HIGH (ref 70–99)

## 2023-03-06 NOTE — Progress Notes (Signed)
Cardiac Individual Treatment Plan  Patient Details  Name: Jason Stevenson MRN: [id number] Date of Birth: 03/05/1986 Referring Provider:   Flowsheet Row CARDIAC REHAB PHASE II ORIENTATION from 03/06/2023 in [facility] for [facility], V[facility], & Lung Health  Referring Provider Dr. Edwena Blow ([name], MD covering)       Initial Encounter Date:  Flowsheet Row CARDIAC REHAB PHASE II ORIENTATION from 03/06/2023 in [facility] for Heart, Vascular, & Lung [facility]  Date 03/06/23       Visit Diagnosis: 11/26/22 S/P orthotopic heart transplant (HCC)  Patient's Home Medications on Admission:  Current Outpatient Medications:    ACCU-CHEK GUIDE test strip, USE TO CHECK BLOOD SUGAR THREE TIMES DAILY., Disp: 100 each, Rfl: 2   acetaminophen (TYLENOL) 325 MG tablet, Take 1-2 tablets (325-650 mg total) by mouth every 4 (four) hours as needed for mild pain., Disp: , Rfl:    amLODipine (NORVASC) 5 MG tablet, Take 5 mg by mouth daily., Disp: , Rfl:    aspirin EC 81 MG tablet, Take 81 mg by mouth daily. Swallow whole., Disp: , Rfl:    Blood Glucose Monitoring Suppl (ACCU-CHEK GUIDE ME) w/Device KIT, USE TO CHECK BLOOD SUGAR THREE TIMES DAILY., Disp: 1 kit, Rfl: 0   Blood Pressure Monitor DEVI, Use as directed to check home blood pressure 2-3 times a week, Disp: 1 Device, Rfl: 0   Calcium Carbonate-Vitamin D (CALTRATE 600+D PO), Take 600 mg by mouth in the morning and at bedtime., Disp: , Rfl:    Continuous Glucose Sensor (FREESTYLE LIBRE 2 SENSOR) MISC, USE EVERY 14 (FOURTEEN) DAYS., Disp: 6 each, Rfl: 1   DULoxetine (CYMBALTA) 30 MG capsule, Take 60 mg by mouth daily. Dose increase by psychiatry, Disp: , Rfl:    EASY COMFORT PEN NEEDLES 32G X 4 MM MISC, USE TO INJECT INSULIN FOUR TIMES A DAY, Disp: 200 each, Rfl: 2   furosemide (LASIX) 20 MG tablet, TAKE 1 TABLET (20 MG TOTAL) BY MOUTH 3 (THREE) TIMES A WEEK. MONDAYS WEDNESDAYS AND FRIDAY (Patient taking  differently: Take 20 mg by mouth daily as needed.), Disp: 30 tablet, Rfl: 11   GLUCAGON NA, Place 3 sprays into the nose once as needed., Disp: , Rfl:    insulin glargine (LANTUS) 100 UNIT/ML injection, Inject 24 Units into the skin daily., Disp: , Rfl:    insulin lispro (HUMALOG KWIKPEN) 100 UNIT/ML KwikPen, Inject 12 Units into the skin., Disp: , Rfl:    Insulin Syringe-Needle U-100 (RELION INSULIN SYRINGE) 31G X 15/64" 0.3 ML MISC, Use to inject insulin daily., Disp: 100 each, Rfl: 11   levETIRAcetam (KEPPRA) 750 MG tablet, TAKE 2 TABLETS BY MOUTH 2 (TWO) TIMES DAILY (2AM+ 2EVENING), Disp: 360 tablet, Rfl: 1   mycophenolate (CELLCEPT) 250 MG capsule, Take 250 mg by mouth every 12 (twelve) hours. Take 4 tablets Q 12 hours, Disp: , Rfl:    Oxcarbazepine (TRILEPTAL) 300 MG tablet, TAKE 2 TABLETS (600 MG TOTAL) BY MOUTH 2 (TWO) TIMES DAILY (AM+EVENING), Disp: 360 tablet, Rfl: 1   pantoprazole (PROTONIX) 40 MG tablet, Take 40 mg by mouth daily., Disp: , Rfl:    predniSONE (DELTASONE) 5 MG tablet, Take 7.5 mg by mouth daily with breakfast. Take 3 tablets once a day, Disp: , Rfl:    pregabalin (LYRICA) 300 MG capsule, TAKE 1 CAPSULE (300 MG TOTAL) BY MOUTH AT BEDTIME. (Patient taking differently: Take 150 mg by mouth at bedtime.), Disp: 90 capsule, Rfl: 1   propranolol (INDERAL)  10 MG tablet, Take 10 mg by mouth 2 (two) times daily., Disp: , Rfl:    rosuvastatin (CRESTOR) 10 MG tablet, Take 20 mg by mouth daily., Disp: , Rfl:    sulfamethoxazole-trimethoprim (BACTRIM) 400-80 MG tablet, Take 1 tablet by mouth once. Take 1 po daily, Disp: , Rfl:    tacrolimus (PROGRAF) 1 MG capsule, Take 1 mg by mouth 2 (two) times daily. Take 7 capsules in the morning and 7 capsules every evening, Disp: , Rfl:    traZODone (DESYREL) 50 MG tablet, Take 50 mg by mouth at bedtime as needed for sleep., Disp: , Rfl:    valGANciclovir (VALCYTE) 450 MG tablet, Take by mouth daily. Take 2 tablets by mouth every evening for 180  days, Disp: , Rfl:    atorvastatin (LIPITOR) 80 MG tablet, TAKE 1 TABLET (80 MG TOTAL) BY MOUTH DAILY (EVENING) (Patient not taking: Reported on 01/21/2023), Disp: 90 tablet, Rfl: 1   CORLANOR 5 MG TABS tablet, TAKE 1 TABLET (5 MG TOTAL) BY MOUTH 2 (TWO) TIMES DAILY WITH A MEAL (AM+EVENING) (Patient not taking: Reported on 03/06/2023), Disp: 60 tablet, Rfl: 6   dapagliflozin propanediol (FARXIGA) 5 MG TABS tablet, TAKE 1 TABLET (5 MG TOTAL) BY MOUTH DAILY BEFORE BREAKFAST. (Patient not taking: Reported on 01/21/2023), Disp: 30 tablet, Rfl: 11   digoxin (LANOXIN) 0.125 MG tablet, TAKE 1 TABLET (0.125 MG TOTAL) BY MOUTH DAILY (AM) (Patient not taking: Reported on 01/21/2023), Disp: 30 tablet, Rfl: 6   Evolocumab (REPATHA SURECLICK) 140 MG/ML SOAJ, Inject 140 mg into the skin every 14 (fourteen) days. (Patient not taking: Reported on 03/06/2023), Disp: 2 mL, Rfl: 11   ezetimibe (ZETIA) 10 MG tablet, TAKE 1 TABLET (10 MG TOTAL) BY MOUTH DAILY (AM) (Patient not taking: Reported on 01/21/2023), Disp: 90 tablet, Rfl: 3   insulin aspart (FIASP FLEXTOUCH) 100 UNIT/ML FlexTouch Pen, Inject 5-8 Units into the skin 3 (three) times daily before meals. (Patient not taking: Reported on 01/21/2023), Disp: 30 mL, Rfl: 3   insulin degludec (TRESIBA FLEXTOUCH) 200 UNIT/ML FlexTouch Pen, Inject 20 Units into the skin daily. (Patient not taking: Reported on 01/21/2023), Disp: 18 mL, Rfl: 3   loperamide (IMODIUM) 2 MG capsule, Take 1 capsule (2 mg total) by mouth 4 (four) times daily as needed for diarrhea or loose stools. (Patient not taking: Reported on 01/21/2023), Disp: 12 capsule, Rfl: 0   Menthol, Topical Analgesic, (ICY HOT BACK EX), Apply 1 application  topically daily as needed (neuropathy). Icy hot cream (Patient not taking: Reported on 01/21/2023), Disp: , Rfl:    metFORMIN (GLUCOPHAGE-XR) 500 MG 24 hr tablet, TAKE 1 TABLET (500 MG TOTAL) BY MOUTH 2 (TWO) TIMES DAILY WITH A MEAL (AM+EVENING) (Patient not taking: Reported on  01/21/2023), Disp: 180 tablet, Rfl: 1   metoprolol succinate (TOPROL-XL) 100 MG 24 hr tablet, TAKE 1 TABLET BY MOUTH DAILY WITH OR IMMEDIATELY FOLLOWING A MEAL (EVENING) (Patient not taking: Reported on 03/06/2023), Disp: 100 tablet, Rfl: 3   potassium chloride SA (KLOR-CON M) 20 MEQ tablet, TAKE 1 TABLET (20 MEQ TOTAL) BY MOUTH 3 (THREE) TIMES A WEEK. TAKE EVERY MON, WED AND FRI WITH FUROSEMIDE (Patient not taking: Reported on 03/06/2023), Disp: 30 tablet, Rfl: 4   sacubitril-valsartan (ENTRESTO) 49-51 MG, Take 1 tablet by mouth 2 (two) times daily. (Patient not taking: Reported on 03/06/2023), Disp: 60 tablet, Rfl: 8   sildenafil (VIAGRA) 50 MG tablet, 1 tab p.o. half hour prior to intercourse PRN.  Limit use to 1  tab/24 hr (Patient not taking: Reported on 01/21/2023), Disp: 10 tablet, Rfl: 6   spironolactone (ALDACTONE) 25 MG tablet, TAKE 1 TABLET (25 MG TOTAL) BY MOUTH AT BEDTIME. (Patient not taking: Reported on 01/21/2023), Disp: 90 tablet, Rfl: 1   warfarin (COUMADIN) 5 MG tablet, TAKE 1 TO 1 & 1/2 TABLETS (5 TO 7.5 MG TOTAL) BY MOUTH DAILY. (Patient not taking: Reported on 01/21/2023), Disp: 40 tablet, Rfl: 3  Past Medical History: Past Medical History:  Diagnosis Date   Chronic systolic CHF (congestive heart failure) (HCC) 06/18/2015   Hemorrhagic stroke (HCC)    Hyperlipidemia    Hypertension    Immune deficiency disorder (HCC)    Nonischemic cardiomyopathy (HCC) Noted as early as 2007   Per chart review (cards consult note 2011), EF of 40% in 2007, down to 20-25% in 2011    Tobacco Use: Social History   Tobacco Use  Smoking Status Never  Smokeless Tobacco Never    Labs: Review Flowsheet  More data exists      Latest Ref Rng & Units 03/21/2022 04/30/2022 05/17/2022 07/29/2022 10/22/2022  Labs for ITP Cardiac and Pulmonary Rehab  Cholestrol 0 - 200 mg/dL - 161  - - -  LDL (calc) 0 - 99 mg/dL - 98  - - -  HDL-C >09 mg/dL - 61  - - -  Trlycerides <150 mg/dL - 82  - - -  Hemoglobin  A1c 4.6 - 6.5 % 7.6  - - 9.2  7.6   Bicarbonate 20.0 - 28.0 mmol/L 20.0 - 28.0 mmol/L - - 27.4  27.2  - -  TCO2 22 - 32 mmol/L 22 - 32 mmol/L - - 29  29  - -  O2 Saturation % % - - 70  73  - -    Details       Multiple values from one day are sorted in reverse-chronological order         Capillary Blood Glucose: Lab Results  Component Value Date   GLUCAP 219 (H) 03/06/2023   GLUCAP 91 06/04/2022   GLUCAP 105 (H) 05/17/2022   GLUCAP 152 (H) 11/26/2021   GLUCAP 140 (H) 11/21/2021     Exercise Target Goals: Exercise Program Goal: Individual exercise prescription set using results from initial 6 min walk test and THRR while considering  patient's activity barriers and safety.   Exercise Prescription Goal: Initial exercise prescription builds to 30-45 minutes a day of aerobic activity, 2-3 days per week.  Home exercise guidelines will be given to patient during program as part of exercise prescription that the participant will acknowledge.  Activity Barriers & Risk Stratification:  Activity Barriers & Cardiac Risk Stratification - 03/06/23 1123       Activity Barriers & Cardiac Risk Stratification   Activity Barriers Back Problems;Deconditioning;Neck/Spine Problems;Shortness of Breath;Balance Concerns;Other (comment)    Comments Pt voices he feels his balance is off sometimes: Achieved 30 seconds on balance test.    Cardiac Risk Stratification High             6 Minute Walk:  6 Minute Walk     Row Name 03/06/23 1012         6 Minute Walk   Phase Initial     Distance 1358 feet     Walk Time 6 minutes     # of Rest Breaks 0     MPH 2.6     METS 4.95     RPE 11     Perceived  Dyspnea  1     VO2 Peak 17.32     Symptoms Yes (comment)     Comments SOB, RPD = 1     Resting HR 92 bpm     Resting BP 112/86     Resting Oxygen Saturation  99 %     Exercise Oxygen Saturation  during 6 min walk 98 %     Max Ex. HR 97 bpm     Max Ex. BP 122/80     2 Minute Post  BP 110/78              Oxygen Initial Assessment:   Oxygen Re-Evaluation:   Oxygen Discharge (Final Oxygen Re-Evaluation):   Initial Exercise Prescription:  Initial Exercise Prescription - 03/06/23 1100       Date of Initial Exercise RX and Referring Provider   Date 03/06/23    Referring Provider Dr. Edwena Blow [name], MD covering)    Expected Discharge Date 05/27/22      Bike   Level 1.5    Watts 50    Minutes 15    METs 4.95      NuStep   Level 2    SPM 80    Minutes 15    METs 4.95      Prescription Details   Frequency (times per week) 3    Duration Progress to 30 minutes of continuous aerobic without signs/symptoms of physical distress      Intensity   THRR 40-80% of Max Heartrate 72-145    Ratings of Perceived Exertion 11-13    Perceived Dyspnea 0-4      Progression   Progression Continue progressive overload as per policy without signs/symptoms or physical distress.      Resistance Training   Training Prescription Yes    Weight 4 lbs    Reps 10-15             Perform Capillary Blood Glucose checks as needed.  Exercise Prescription Changes:   Exercise Comments:   Exercise Goals and Review:   Exercise Goals     Row Name 03/06/23 0823             Exercise Goals   Increase Physical Activity Yes       Intervention Provide advice, education, support and counseling about physical activity/exercise needs.;Develop an individualized exercise prescription for aerobic and resistive training based on initial evaluation findings, risk stratification, comorbidities and participant's personal goals.       Expected Outcomes Short Term: Attend rehab on a regular basis to increase amount of physical activity.;Long Term: Exercising regularly at least 3-5 days a week.;Long Term: Add in home exercise to make exercise part of routine and to increase amount of physical activity.       Increase Strength and Stamina Yes       Intervention Provide  advice, education, support and counseling about physical activity/exercise needs.;Develop an individualized exercise prescription for aerobic and resistive training based on initial evaluation findings, risk stratification, comorbidities and participant's personal goals.       Expected Outcomes Short Term: Increase workloads from initial exercise prescription for resistance, speed, and METs.;Short Term: Perform resistance training exercises routinely during rehab and add in resistance training at home;Long Term: Improve cardiorespiratory fitness, muscular endurance and strength as measured by increased METs and functional capacity ( )       Able to understand and use rate of perceived exertion (RPE) scale Yes       Intervention Provide education  and explanation on how to use RPE scale       Expected Outcomes Short Term: Able to use RPE daily in rehab to express subjective intensity level;Long Term:  Able to use RPE to guide intensity level when exercising independently       Knowledge and understanding of Target Heart Rate Range (THRR) Yes       Intervention Provide education and explanation of THRR including how the numbers were predicted and where they are located for reference       Expected Outcomes Short Term: Able to state/look up THRR;Short Term: Able to use daily as guideline for intensity in rehab;Long Term: Able to use THRR to govern intensity when exercising independently       Understanding of Exercise Prescription Yes       Intervention Provide education, explanation, and written materials on patient's individual exercise prescription       Expected Outcomes Long Term: Able to explain home exercise prescription to exercise independently;Short Term: Able to explain program exercise prescription                Exercise Goals Re-Evaluation :   Discharge Exercise Prescription (Final Exercise Prescription Changes):   Nutrition:  Target Goals: Understanding of nutrition guidelines,  daily intake of sodium 1500mg , cholesterol 200mg , calories 30% from fat and 7% or less from saturated fats, daily to have 5 or more servings of fruits and vegetables.  Biometrics:  Pre Biometrics - 03/06/23 0927       Pre Biometrics   Waist Circumference 37.5 inches    Hip Circumference 40.5 inches    Waist to Hip Ratio 0.93 %    Triceps Skinfold 11 mm    % Body Fat 22.3 %    Grip Strength 39 kg    Flexibility 14 in    Single Leg Stand 30 seconds              Nutrition Therapy Plan and Nutrition Goals:   Nutrition Assessments:  MEDIFICTS Score Key: >=70 Need to make dietary changes  40-70 Heart Healthy Diet <= 40 Therapeutic Level Cholesterol Diet   Flowsheet Row CARDIAC REHAB PHASE II EXERCISE from 01/18/2022 in [facility] Heart, Vascular, & Lung Health  Picture Your Plate Total Score on Discharge 64      Picture Your Plate Scores: <16 Unhealthy dietary pattern with much room for improvement. 41-50 Dietary pattern unlikely to meet recommendations for good health and room for improvement. 51-60 More healthful dietary pattern, with some room for improvement.  >60 Healthy dietary pattern, although there may be some specific behaviors that could be improved.    Nutrition Goals Re-Evaluation:   Nutrition Goals Re-Evaluation:   Nutrition Goals Discharge (Final Nutrition Goals Re-Evaluation):   Psychosocial: Target Goals: Acknowledge presence or absence of significant depression and/or stress, maximize coping skills, provide positive support system. Participant is able to verbalize types and ability to use techniques and skills needed for reducing stress and depression.  Initial Review & Psychosocial Screening:  Initial Psych Review & Screening - 03/06/23 1131       Initial Review   Current issues with Current Anxiety/Panic;Current Stress Concerns    Source of Stress Concerns Financial      Family Dynamics   Good Support  System? Yes   Pt has his spouse for support     Barriers   Psychosocial barriers to participate in program The patient should benefit from training in stress management and relaxation.  Screening Interventions   Interventions Encouraged to exercise    Expected Outcomes Short Term goal: Utilizing psychosocial counselor, staff and physician to assist with identification of specific Stressors or current issues interfering with healing process. Setting desired goal for each stressor or current issue identified.;Long Term Goal: Stressors or current issues are controlled or eliminated.;Short Term goal: Identification and review with participant of any Quality of Life or Depression concerns found by scoring the questionnaire.;Long Term goal: The participant improves quality of Life and PHQ9 Scores as seen by post scores and/or verbalization of changes             Quality of Life Scores:  Quality of Life - 03/06/23 1132       Quality of Life   Select Quality of Life      Quality of Life Scores   Health/Function Pre 18.04 %    Socioeconomic Pre 9.67 %    Psych/Spiritual Pre 23 %    Family Pre 20.63 %    GLOBAL Pre 17.87 %            Scores of 19 and below usually indicate a poorer quality of life in these areas.  A difference of  2-3 points is a clinically meaningful difference.  A difference of 2-3 points in the total score of the Quality of Life Index has been associated with significant improvement in overall quality of life, self-image, physical symptoms, and general health in studies assessing change in quality of life.  PHQ-9: Review Flowsheet  More data exists      03/06/2023 08/08/2022 01/18/2022 11/13/2021 04/20/2021  Depression screen PHQ 2/9  Decreased Interest 0 0 0 0 1  Down, Depressed, Hopeless 0 1 1 1 1   PHQ - 2 Score 0 1 1 1 2   Altered sleeping 0 0 0 1 3  Tired, decreased energy 0 1 0 1 2  Change in appetite 0 1 0 1 2  Feeling bad or failure about yourself  0 0 1  0 0  Trouble concentrating 0 1 1 1  0  Moving slowly or fidgety/restless 0 0 1 1 0  Suicidal thoughts 0 0 0 1 0  PHQ-9 Score 0 4 4 7 9   Difficult doing work/chores Not difficult at all - - Somewhat difficult -    Details           Interpretation of Total Score  Total Score Depression Severity:  1-4 = Minimal depression, 5-9 = Mild depression, 10-14 = Moderate depression, 15-19 = Moderately severe depression, 20-27 = Severe depression   Psychosocial Evaluation and Intervention:   Psychosocial Re-Evaluation:   Psychosocial Discharge (Final Psychosocial Re-Evaluation):   Vocational Rehabilitation: Provide vocational rehab assistance to qualifying candidates.   Vocational Rehab Evaluation & Intervention:  Vocational Rehab - 03/06/23 0935       Initial Vocational Rehab Evaluation & Intervention   Assessment shows need for Vocational Rehabilitation No   pt on disability            Education: Education Goals: Education classes will be provided on a weekly basis, covering required topics. Participant will state understanding/return demonstration of topics presented.     Core Videos: Exercise    Move It!  Clinical staff conducted group or individual video education with verbal and written material and guidebook.  Patient learns the recommended Pritikin exercise program. Exercise with the goal of living a long, healthy life. Some of the health benefits of exercise include controlled diabetes, healthier blood pressure levels, improved  cholesterol levels, improved heart and lung capacity, improved sleep, and better body composition. Everyone should speak with their doctor before starting or changing an exercise routine.  Biomechanical Limitations Clinical staff conducted group or individual video education with verbal and written material and guidebook.  Patient learns how biomechanical limitations can impact exercise and how we can mitigate and possibly overcome limitations  to have an impactful and balanced exercise routine.  Body Composition Clinical staff conducted group or individual video education with verbal and written material and guidebook.  Patient learns that body composition (ratio of muscle mass to fat mass) is a key component to assessing overall fitness, rather than body weight alone. Increased fat mass, especially visceral belly fat, can put [place] at increased risk for metabolic syndrome, type 2 diabetes, heart disease, and even death. It is recommended to combine diet and exercise (cardiovascular and resistance training) to improve your body composition. Seek guidance from your physician and exercise physiologist before implementing an exercise routine.  Exercise Action Plan Clinical staff conducted group or individual video education with verbal and written material and guidebook.  Patient learns the recommended strategies to achieve and enjoy long-term exercise adherence, including variety, self-motivation, self-efficacy, and positive decision making. Benefits of exercise include fitness, good health, weight management, more energy, better sleep, less stress, and overall well-being.  Medical   Heart Disease Risk Reduction Clinical staff conducted group or individual video education with verbal and written material and guidebook.  Patient learns our heart is our most vital organ as it circulates oxygen, nutrients, white blood cells, and hormones throughout the entire body, and carries waste away. Data supports a plant-based eating plan like the Pritikin Program for its effectiveness in slowing progression of and reversing heart disease. The video provides a number of recommendations to address heart disease.   Metabolic Syndrome and Belly Fat  Clinical staff conducted group or individual video education with verbal and written material and guidebook.  Patient learns what metabolic syndrome is, how it leads to heart disease, and how one can reverse it and  keep it from coming back. You have metabolic syndrome if you have 3 of the following 5 criteria: abdominal obesity, high blood pressure, high triglycerides, low HDL cholesterol, and high blood sugar.  Hypertension and Heart Disease Clinical staff conducted group or individual video education with verbal and written material and guidebook.  Patient learns that high blood pressure, or hypertension, is very common in the [place]. Hypertension is largely due to excessive salt intake, but other important risk factors include being overweight, physical inactivity, drinking too much alcohol, smoking, and not eating enough potassium from fruits and vegetables. High blood pressure is a leading risk factor for heart attack, stroke, congestive heart failure, dementia, kidney failure, and premature death. Long-term effects of excessive salt intake include stiffening of the arteries and thickening of heart muscle and organ damage. Recommendations include ways to reduce hypertension and the risk of heart disease.  Diseases of Our Time - Focusing on Diabetes Clinical staff conducted group or individual video education with verbal and written material and guidebook.  Patient learns why the best way to stop diseases of our time is prevention, through food and other lifestyle changes. Medicine (such as prescription pills and surgeries) is often only a Band-Aid on the problem, not a long-term solution. Most common diseases of our time include obesity, type 2 diabetes, hypertension, heart disease, and cancer. The Pritikin Program is recommended and has been proven to help reduce, reverse, and/or  prevent the damaging effects of metabolic syndrome.  Nutrition   Overview of the Pritikin Eating Plan  Clinical staff conducted group or individual video education with verbal and written material and guidebook.  Patient learns about the Pritikin Eating Plan for disease risk reduction. The Pritikin Eating Plan emphasizes a  wide variety of unrefined, minimally-processed carbohydrates, like fruits, vegetables, whole grains, and legumes. Go, Caution, and Stop food choices are explained. Plant-based and lean animal proteins are emphasized. Rationale provided for low sodium intake for blood pressure control, low added sugars for blood sugar stabilization, and low added fats and oils for coronary artery disease risk reduction and weight management.  Calorie Density  Clinical staff conducted group or individual video education with verbal and written material and guidebook.  Patient learns about calorie density and how it impacts the Pritikin Eating Plan. Knowing the characteristics of the food you choose will help you decide whether those foods will lead to weight gain or weight loss, and whether you want to consume more or less of them. Weight loss is usually a side effect of the Pritikin Eating Plan because of its focus on low calorie-dense foods.  Label Reading  Clinical staff conducted group or individual video education with verbal and written material and guidebook.  Patient learns about the Pritikin recommended label reading guidelines and corresponding recommendations regarding calorie density, added sugars, sodium content, and whole grains.  Dining Out - Part 1  Clinical staff conducted group or individual video education with verbal and written material and guidebook.  Patient learns that restaurant meals can be sabotaging because they can be so high in calories, fat, sodium, and/or sugar. Patient learns recommended strategies on how to positively address this and avoid unhealthy pitfalls.  Facts on Fats  Clinical staff conducted group or individual video education with verbal and written material and guidebook.  Patient learns that lifestyle modifications can be just as effective, if not more so, as many medications for lowering your risk of heart disease. A Pritikin lifestyle can help to reduce your risk of  inflammation and atherosclerosis (cholesterol build-up, or plaque, in the artery walls). Lifestyle interventions such as dietary choices and physical activity address the cause of atherosclerosis. A review of the types of fats and their impact on blood cholesterol levels, along with dietary recommendations to reduce fat intake is also included.  Nutrition Action Plan  Clinical staff conducted group or individual video education with verbal and written material and guidebook.  Patient learns how to incorporate Pritikin recommendations into their lifestyle. Recommendations include planning and keeping personal health goals in mind as an important part of their success.  Healthy Mind-Set    Healthy Minds, Bodies, Hearts  Clinical staff conducted group or individual video education with verbal and written material and guidebook.  Patient learns how to identify when they are stressed. Video will discuss the impact of that stress, as well as the many benefits of stress management. Patient will also be introduced to stress management techniques. The way we think, act, and feel has an impact on our hearts.  How Our Thoughts Can Heal Our Hearts  Clinical staff conducted group or individual video education with verbal and written material and guidebook.  Patient learns that negative thoughts can cause depression and anxiety. This can result in negative lifestyle behavior and serious health problems. Cognitive behavioral therapy is an effective method to help control our thoughts in order to change and improve our emotional outlook.  Additional Videos:  Exercise  Improving Performance  Clinical staff conducted group or individual video education with verbal and written material and guidebook.  Patient learns to use a non-linear approach by alternating intensity levels and lengths of time spent exercising to help burn more calories and lose more body fat. Cardiovascular exercise helps improve heart health,  metabolism, hormonal balance, blood sugar control, and recovery from fatigue. Resistance training improves strength, endurance, balance, coordination, reaction time, metabolism, and muscle mass. Flexibility exercise improves circulation, posture, and balance. Seek guidance from your physician and exercise physiologist before implementing an exercise routine and learn your capabilities and proper form for all exercise.  Introduction to Yoga  Clinical staff conducted group or individual video education with verbal and written material and guidebook.  Patient learns about yoga, a discipline of the coming together of mind, breath, and body. The benefits of yoga include improved flexibility, improved range of motion, better posture and core strength, increased lung function, weight loss, and positive self-image. Yoga's heart health benefits include lowered blood pressure, healthier heart rate, decreased cholesterol and triglyceride levels, improved immune function, and reduced stress. Seek guidance from your physician and exercise physiologist before implementing an exercise routine and learn your capabilities and proper form for all exercise.  Medical   Aging: Enhancing Your Quality of Life  Clinical staff conducted group or individual video education with verbal and written material and guidebook.  Patient learns key strategies and recommendations to stay in good physical health and enhance quality of life, such as prevention strategies, having an advocate, securing a Health Care Proxy and Power of Attorney, and keeping a list of medications and system for tracking them. It also discusses how to avoid risk for bone loss.  Biology of Weight Control  Clinical staff conducted group or individual video education with verbal and written material and guidebook.  Patient learns that weight gain occurs because we consume more calories than we burn (eating more, moving less). Even if your body weight is normal, you  may have higher ratios of fat compared to muscle mass. Too much body fat puts you at increased risk for cardiovascular disease, heart attack, stroke, type 2 diabetes, and obesity-related cancers. In addition to exercise, following the Pritikin Eating Plan can help reduce your risk.  Decoding Lab Results  Clinical staff conducted group or individual video education with verbal and written material and guidebook.  Patient learns that lab test reflects one measurement whose values change over time and are influenced by many factors, including medication, stress, sleep, exercise, food, hydration, pre-existing medical conditions, and more. It is recommended to use the knowledge from this video to become more involved with your lab results and evaluate your numbers to speak with your doctor.   Diseases of Our Time - Overview  Clinical staff conducted group or individual video education with verbal and written material and guidebook.  Patient learns that according to the CDC, 50% to 70% of chronic diseases (such as obesity, type 2 diabetes, elevated lipids, hypertension, and heart disease) are avoidable through lifestyle improvements including healthier food choices, listening to satiety cues, and increased physical activity.  Sleep Disorders Clinical staff conducted group or individual video education with verbal and written material and guidebook.  Patient learns how good quality and duration of sleep are important to overall health and well-being. Patient also learns about sleep disorders and how they impact health along with recommendations to address them, including discussing with a physician.  Nutrition  Dining Out - Part 2 Clinical staff conducted  group or individual video education with verbal and written material and guidebook.  Patient learns how to plan ahead and communicate in order to maximize their dining experience in a healthy and nutritious manner. Included are recommended food choices  based on the type of restaurant the patient is visiting.   Fueling a Banker conducted group or individual video education with verbal and written material and guidebook.  There is a strong connection between our food choices and our health. Diseases like obesity and type 2 diabetes are very prevalent and are in large-part due to lifestyle choices. The Pritikin Eating Plan provides plenty of food and hunger-curbing satisfaction. It is easy to follow, affordable, and helps reduce health risks.  Menu Workshop  Clinical staff conducted group or individual video education with verbal and written material and guidebook.  Patient learns that restaurant meals can sabotage health goals because they are often packed with calories, fat, sodium, and sugar. Recommendations include strategies to plan ahead and to communicate with the manager, chef, or server to help order a healthier meal.  Planning Your Eating Strategy  Clinical staff conducted group or individual video education with verbal and written material and guidebook.  Patient learns about the Pritikin Eating Plan and its benefit of reducing the risk of disease. The Pritikin Eating Plan does not focus on calories. Instead, it emphasizes high-quality, nutrient-rich foods. By knowing the characteristics of the foods, we choose, we can determine their calorie density and make informed decisions.  Targeting Your Nutrition Priorities  Clinical staff conducted group or individual video education with verbal and written material and guidebook.  Patient learns that lifestyle habits have a tremendous impact on disease risk and progression. This video provides eating and physical activity recommendations based on your personal health goals, such as reducing LDL cholesterol, losing weight, preventing or controlling type 2 diabetes, and reducing high blood pressure.  Vitamins and Minerals  Clinical staff conducted group or individual video  education with verbal and written material and guidebook.  Patient learns different ways to obtain key vitamins and minerals, including through a recommended healthy diet. It is important to discuss all supplements you take with your doctor.   Healthy Mind-Set    Smoking Cessation  Clinical staff conducted group or individual video education with verbal and written material and guidebook.  Patient learns that cigarette smoking and tobacco addiction pose a serious health risk which affects millions of people. Stopping smoking will significantly reduce the risk of heart disease, lung disease, and many forms of cancer. Recommended strategies for quitting are covered, including working with your doctor to develop a successful plan.  Culinary   Becoming a Set designer conducted group or individual video education with verbal and written material and guidebook.  Patient learns that cooking at home can be healthy, cost-effective, quick, and puts them in control. Keys to cooking healthy recipes will include looking at your recipe, assessing your equipment needs, planning ahead, making it simple, choosing cost-effective seasonal ingredients, and limiting the use of added fats, salts, and sugars.  Cooking - Breakfast and Snacks  Clinical staff conducted group or individual video education with verbal and written material and guidebook.  Patient learns how important breakfast is to satiety and nutrition through the entire day. Recommendations include key foods to eat during breakfast to help stabilize blood sugar levels and to prevent overeating at meals later in the day. Planning ahead is also a key component.  Cooking - Futures trader  and Sides  Clinical staff conducted group or individual video education with verbal and written material and guidebook.  Patient learns eating strategies to improve overall health, including an approach to cook more at home. Recommendations include thinking of  animal protein as a side on your plate rather than center stage and focusing instead on lower calorie dense options like vegetables, fruits, whole grains, and plant-based proteins, such as beans. Making sauces in large quantities to freeze for later and leaving the skin on your vegetables are also recommended to maximize your experience.  Cooking - Healthy Salads and Dressing Clinical staff conducted group or individual video education with verbal and written material and guidebook.  Patient learns that vegetables, fruits, whole grains, and legumes are the foundations of the Pritikin Eating Plan. Recommendations include how to incorporate each of these in flavorful and healthy salads, and how to create homemade salad dressings. Proper handling of ingredients is also covered. Cooking - Soups and [employer] - Soups and Desserts Clinical staff conducted group or individual video education with verbal and written material and guidebook.  Patient learns that Pritikin soups and desserts make for easy, nutritious, and delicious snacks and meal components that are low in sodium, fat, sugar, and calorie density, while high in vitamins, minerals, and filling fiber. Recommendations include simple and healthy ideas for soups and desserts.   Overview     The Pritikin Solution Program Overview Clinical staff conducted group or individual video education with verbal and written material and guidebook.  Patient learns that the results of the Pritikin Program have been documented in more than 100 articles published in peer-reviewed journals, and the benefits include reducing risk factors for (and, in some cases, even reversing) high cholesterol, high blood pressure, type 2 diabetes, obesity, and more! An overview of the three key pillars of the Pritikin Program will be covered: eating well, doing regular exercise, and having a healthy mind-set.  WORKSHOPS  Exercise: Exercise Basics: Building Your Action  Plan Clinical staff led group instruction and group discussion with PowerPoint presentation and patient guidebook. To enhance the learning environment the use of posters, models and videos may be added. At the conclusion of this workshop, patients will comprehend the difference between physical activity and exercise, as well as the benefits of incorporating both, into their routine. Patients will understand the FITT (Frequency, Intensity, Time, and Type) principle and how to use it to build an exercise action plan. In addition, safety concerns and other considerations for exercise and cardiac rehab will be addressed by the presenter. The purpose of this lesson is to promote a comprehensive and effective weekly exercise routine in order to improve patients' overall level of fitness.   Managing Heart Disease: Your Path to a Healthier Heart Clinical staff led group instruction and group discussion with PowerPoint presentation and patient guidebook. To enhance the learning environment the use of posters, models and videos may be added.At the conclusion of this workshop, patients will understand the anatomy and physiology of the heart. Additionally, they will understand how Pritikin's three pillars impact the risk factors, the progression, and the management of heart disease.  The purpose of this lesson is to provide a high-level overview of the heart, heart disease, and how the Pritikin lifestyle positively impacts risk factors.  Exercise Biomechanics Clinical staff led group instruction and group discussion with PowerPoint presentation and patient guidebook. To enhance the learning environment the use of posters, models and videos may be added. Patients will learn how the  structural parts of their bodies function and how these functions impact their daily activities, movement, and exercise. Patients will learn how to promote a neutral spine, learn how to manage pain, and identify ways to improve  their physical movement in order to promote healthy living. The purpose of this lesson is to expose patients to common physical limitations that impact physical activity. Participants will learn practical ways to adapt and manage aches and pains, and to minimize their effect on regular exercise. Patients will learn how to maintain good posture while sitting, walking, and lifting.  Balance Training and Fall Prevention  Clinical staff led group instruction and group discussion with PowerPoint presentation and patient guidebook. To enhance the learning environment the use of posters, models and videos may be added. At the conclusion of this workshop, patients will understand the importance of their sensorimotor skills (vision, proprioception, and the vestibular system) in maintaining their ability to balance as they age. Patients will apply a variety of balancing exercises that are appropriate for their current level of function. Patients will understand the common causes for poor balance, possible solutions to these problems, and ways to modify their physical environment in order to minimize their fall risk. The purpose of this lesson is to teach patients about the importance of maintaining balance as they age and ways to minimize their risk of falling.  WORKSHOPS   Nutrition:  Fueling a Ship broker led group instruction and group discussion with PowerPoint presentation and patient guidebook. To enhance the learning environment the use of posters, models and videos may be added. Patients will review the foundational principles of the Pritikin Eating Plan and understand what constitutes a serving size in each of the food groups. Patients will also learn Pritikin-friendly foods that are better choices when away from home and review make-ahead meal and snack options. Calorie density will be reviewed and applied to three nutrition priorities: weight maintenance, weight loss, and weight  gain. The purpose of this lesson is to reinforce (in a group setting) the key concepts around what patients are recommended to eat and how to apply these guidelines when away from home by planning and selecting Pritikin-friendly options. Patients will understand how calorie density may be adjusted for different weight management goals.  Mindful Eating  Clinical staff led group instruction and group discussion with PowerPoint presentation and patient guidebook. To enhance the learning environment the use of posters, models and videos may be added. Patients will briefly review the concepts of the Pritikin Eating Plan and the importance of low-calorie dense foods. The concept of mindful eating will be introduced as well as the importance of paying attention to internal hunger signals. Triggers for non-hunger eating and techniques for dealing with triggers will be explored. The purpose of this lesson is to provide patients with the opportunity to review the basic principles of the Pritikin Eating Plan, discuss the value of eating mindfully and how to measure internal cues of hunger and fullness using the Hunger Scale. Patients will also discuss reasons for non-hunger eating and learn strategies to use for controlling emotional eating.  Targeting Your Nutrition Priorities Clinical staff led group instruction and group discussion with PowerPoint presentation and patient guidebook. To enhance the learning environment the use of posters, models and videos may be added. Patients will learn how to determine their genetic susceptibility to disease by reviewing their family history. Patients will gain insight into the importance of diet as part of an overall healthy lifestyle in mitigating the  impact of genetics and other environmental insults. The purpose of this lesson is to provide patients with the opportunity to assess their personal nutrition priorities by looking at their family history, their own health history  and current risk factors. Patients will also be able to discuss ways of prioritizing and modifying the Pritikin Eating Plan for their highest risk areas  Menu  Clinical staff led group instruction and group discussion with PowerPoint presentation and patient guidebook. To enhance the learning environment the use of posters, models and videos may be added. Using menus brought in from [employer], or printed from [employer], patients will apply the Pritikin dining out guidelines that were presented in the [employer] video. Patients will also be able to practice these guidelines in a variety of provided scenarios. The purpose of this lesson is to provide patients with the opportunity to practice hands-on learning of the Pritikin Dining Out guidelines with actual menus and practice scenarios.  Label Reading Clinical staff led group instruction and group discussion with PowerPoint presentation and patient guidebook. To enhance the learning environment the use of posters, models and videos may be added. Patients will review and discuss the Pritikin label reading guidelines presented in Pritikin's Label Reading Educational series video. Using fool labels brought in from local grocery stores and markets, patients will apply the label reading guidelines and determine if the packaged food meet the Pritikin guidelines. The purpose of this lesson is to provide patients with the opportunity to review, discuss, and practice hands-on learning of the Pritikin Label Reading guidelines with actual packaged food labels. Cooking School  Pritikin's [employer] are designed to teach patients ways to prepare quick, simple, and affordable recipes at home. The importance of nutrition's role in chronic disease risk reduction is reflected in its emphasis in the overall Pritikin program. By learning how to prepare essential core Pritikin Eating Plan recipes, patients will increase control over  what they eat; be able to customize the flavor of foods without the use of added salt, sugar, or fat; and improve the quality of the food they consume. By learning a set of core recipes which are easily assembled, quickly prepared, and affordable, patients are more likely to prepare more healthy foods at home. These workshops focus on convenient breakfasts, simple entres, side dishes, and desserts which can be prepared with minimal effort and are consistent with nutrition recommendations for cardiovascular risk reduction. Cooking [employer] are taught by a Armed forces logistics/support/administrative officer (RD) who has been trained by the [employer]. The chef or RD has a clear understanding of the importance of minimizing - if not completely eliminating - added fat, sugar, and sodium in recipes. Throughout the series of Cooking School Workshop sessions, patients will learn about healthy ingredients and efficient methods of cooking to build confidence in their capability to prepare    Cooking School weekly topics:  Adding Flavor- Sodium-Free  Fast and Healthy Breakfasts  Powerhouse Plant-Based Proteins  Satisfying Salads and Dressings  Simple Sides and Sauces  International Cuisine-Spotlight on the [employer] Zones  Delicious Desserts  Savory Soups  [employer] - Meals in a Astronomer Appetizers and Snacks  Comforting Weekend Breakfasts  One-Pot Wonders   Fast Evening Meals  Landscape architect Your Pritikin Plate  WORKSHOPS   Healthy Mindset (Psychosocial):  Focused Goals, Sustainable Changes Clinical staff led group instruction and group discussion with PowerPoint presentation and patient guidebook. To enhance the learning environment the  use of posters, models and videos may be added. Patients will be able to apply effective goal setting strategies to establish at least one personal goal, and then take consistent, meaningful action toward that goal. They will learn to  identify common barriers to achieving personal goals and develop strategies to overcome them. Patients will also gain an understanding of how our mind-set can impact our ability to achieve goals and the importance of cultivating a positive and growth-oriented mind-set. The purpose of this lesson is to provide patients with a deeper understanding of how to set and achieve personal goals, as well as the tools and strategies needed to overcome common obstacles which may arise along the way.  From Head to Heart: The Power of a Healthy Outlook  Clinical staff led group instruction and group discussion with PowerPoint presentation and patient guidebook. To enhance the learning environment the use of posters, models and videos may be added. Patients will be able to recognize and describe the impact of emotions and mood on physical health. They will discover the importance of self-care and explore self-care practices which may work for them. Patients will also learn how to utilize the 4 C's to cultivate a healthier outlook and better manage stress and challenges. The purpose of this lesson is to demonstrate to patients how a healthy outlook is an essential part of maintaining good health, especially as they continue their cardiac rehab journey.  Healthy Sleep for a Healthy Heart Clinical staff led group instruction and group discussion with PowerPoint presentation and patient guidebook. To enhance the learning environment the use of posters, models and videos may be added. At the conclusion of this workshop, patients will be able to demonstrate knowledge of the importance of sleep to overall health, well-being, and quality of life. They will understand the symptoms of, and treatments for, common sleep disorders. Patients will also be able to identify daytime and nighttime behaviors which impact sleep, and they will be able to apply these tools to help manage sleep-related challenges. The purpose of this lesson is to  provide patients with a general overview of sleep and outline the importance of quality sleep. Patients will learn about a few of the most common sleep disorders. Patients will also be introduced to the concept of "sleep hygiene," and discover ways to self-manage certain sleeping problems through simple daily behavior changes. Finally, the workshop will motivate patients by clarifying the links between quality sleep and their goals of heart-healthy living.   Recognizing and Reducing Stress Clinical staff led group instruction and group discussion with PowerPoint presentation and patient guidebook. To enhance the learning environment the use of posters, models and videos may be added. At the conclusion of this workshop, patients will be able to understand the types of stress reactions, differentiate between acute and chronic stress, and recognize the impact that chronic stress has on their health. They will also be able to apply different coping mechanisms, such as reframing negative self-talk. Patients will have the opportunity to practice a variety of stress management techniques, such as deep abdominal breathing, progressive muscle relaxation, and/or guided imagery.  The purpose of this lesson is to educate patients on the role of stress in their lives and to provide healthy techniques for coping with it.  Learning Barriers/Preferences:  Learning Barriers/Preferences - 03/06/23 0934       Learning Barriers/Preferences   Learning Barriers Sight   wears glasses   Learning Preferences Audio;Computer/Internet;Group Instruction;Individual Instruction;Pictoral;Skilled Demonstration;Verbal Instruction;Video;Written Material  Education Topics:  Knowledge Questionnaire Score:  Knowledge Questionnaire Score - 03/06/23 1133       Knowledge Questionnaire Score   Pre Score 22/24             Core Components/Risk Factors/Patient Goals at Admission:  Personal Goals and Risk Factors at  Admission - 03/06/23 1133       Core Components/Risk Factors/Patient Goals on Admission   Improve shortness of breath with ADL's Yes    Intervention Provide education, individualized exercise plan and daily activity instruction to help decrease symptoms of SOB with activities of daily living.    Expected Outcomes Short Term: Improve cardiorespiratory fitness to achieve a reduction of symptoms when performing ADLs;Long Term: Be able to perform more ADLs without symptoms or delay the onset of symptoms    Diabetes Yes    Intervention Provide education about proper nutrition, including hydration, and aerobic/resistive exercise prescription along with prescribed medications to achieve blood glucose in normal ranges: Fasting glucose 65-99 mg/dL;Provide education about signs/symptoms and action to take for hypo/hyperglycemia.    Expected Outcomes Long Term: Attainment of HbA1C < 7%.;Short Term: Participant verbalizes understanding of the signs/symptoms and immediate care of hyper/hypoglycemia, proper foot care and importance of medication, aerobic/resistive exercise and nutrition plan for blood glucose control.    Hypertension Yes    Intervention Provide education on lifestyle modifcations including regular physical activity/exercise, weight management, moderate sodium restriction and increased consumption of fresh fruit, vegetables, and low fat dairy, alcohol moderation, and smoking cessation.;Monitor prescription use compliance.    Expected Outcomes Short Term: Continued assessment and intervention until BP is < 140/41mm HG in hypertensive participants. < 130/65mm HG in hypertensive participants with diabetes, heart failure or chronic kidney disease.;Long Term: Maintenance of blood pressure at goal levels.    Lipids Yes    Intervention Provide education and support for participant on nutrition & aerobic/resistive exercise along with prescribed medications to achieve LDL 70mg , HDL >40mg .    Expected  Outcomes Short Term: Participant states understanding of desired cholesterol values and is compliant with medications prescribed. Participant is following exercise prescription and nutrition guidelines.;Long Term: Cholesterol controlled with medications as prescribed, with individualized exercise RX and with personalized nutrition plan. Value goals: LDL < 70mg , HDL > 40 mg.    Stress Yes    Intervention Offer individual and/or small group education and counseling on adjustment to heart disease, stress management and health-related lifestyle change. Teach and support self-help strategies.    Expected Outcomes Short Term: Participant demonstrates changes in health-related behavior, relaxation and other stress management skills, ability to obtain effective social support, and compliance with psychotropic medications if prescribed.;Long Term: Emotional wellbeing is indicated by absence of clinically significant psychosocial distress or social isolation.             Core Components/Risk Factors/Patient Goals Review:    Core Components/Risk Factors/Patient Goals at Discharge (Final Review):    ITP Comments:  ITP Comments     Row Name 03/06/23 8295           ITP Comments Dr. Armanda Magic medical director. Introduction to [facility] education/intensive cardiac rehab. Initial orientation packet reviewed with patient.                Comments: Participant attended orientation for the cardiac rehabilitation program on  03/06/2023  to perform initial intake and exercise walk test. Patient introduced to the [facility] education and orientation packet was reviewed. Completed 6-minute walk test, measurements, initial ITP, and exercise prescription. Vital  signs stable. Telemetry-normal sinus rhythm, asymptomatic.   Service time was from 8:20 to 10:59.

## 2023-03-06 NOTE — Progress Notes (Signed)
Cardiac Rehab Medication Review   Does the patient  feel that his/her medications are working for him/her?  yes  Has the patient been experiencing any side effects to the medications prescribed?  yes  Does the patient measure his/her own blood pressure or blood glucose at home?  yes   Does the patient have any problems obtaining medications due to transportation or finances?   no  Understanding of regimen: good Understanding of indications: good Potential of compliance: good    Comments: Pt feels meds are working but still has some tremors, but the Propanolol is helping. Pt has tremors form the tacrolimus. Pt checks blood pressure and blood sugar daily.    Lorin Picket 03/06/2023 9:10 AM

## 2023-03-07 DIAGNOSIS — Z941 Heart transplant status: Secondary | ICD-10-CM | POA: Diagnosis not present

## 2023-03-07 DIAGNOSIS — D849 Immunodeficiency, unspecified: Secondary | ICD-10-CM | POA: Diagnosis not present

## 2023-03-10 ENCOUNTER — Encounter (HOSPITAL_COMMUNITY)
Admission: RE | Admit: 2023-03-10 | Discharge: 2023-03-10 | Disposition: A | Discharge status: Home / Self Care | Payer: Medicaid Other | Source: Ambulatory Visit | Attending: Cardiology | Admitting: Cardiology

## 2023-03-10 DIAGNOSIS — Z941 Heart transplant status: Secondary | ICD-10-CM

## 2023-03-10 LAB — GLUCOSE, CAPILLARY
Glucose-Capillary: 270 mg/dL — ABNORMAL HIGH (ref 70–99)
Glucose-Capillary: 322 mg/dL — ABNORMAL HIGH (ref 70–99)

## 2023-03-10 NOTE — Progress Notes (Signed)
CBG 322. No exercise per protocol. Patient ate a parfait for breakfast. Patient reported that he did not take his insulin this morning. [facility] was called and notified spoke with RN

## 2023-03-11 ENCOUNTER — Encounter: Payer: Self-pay | Admitting: Internal Medicine

## 2023-03-12 ENCOUNTER — Encounter (HOSPITAL_COMMUNITY): Payer: Medicaid Other

## 2023-03-17 ENCOUNTER — Encounter (HOSPITAL_COMMUNITY)
Admission: RE | Admit: 2023-03-17 | Discharge: 2023-03-17 | Disposition: A | Discharge status: Home / Self Care | Payer: Medicaid Other | Source: Ambulatory Visit | Attending: Cardiology | Admitting: Cardiology

## 2023-03-17 DIAGNOSIS — Z941 Heart transplant status: Secondary | ICD-10-CM | POA: Insufficient documentation

## 2023-03-17 LAB — GLUCOSE, CAPILLARY
Glucose-Capillary: 155 mg/dL — ABNORMAL HIGH (ref 70–99)
Glucose-Capillary: 131 mg/dL — ABNORMAL HIGH (ref 70–99)

## 2023-03-17 NOTE — Progress Notes (Signed)
Daily Session Note  Patient Details  Name: Jason Stevenson MRN: [id number] Date of Birth: 1986-03-09 Referring Provider:   Flowsheet Row CARDIAC REHAB PHASE II ORIENTATION from 03/06/2023 in [facility]  Referring Provider Dr. Edwena Blow [name], MD covering)       Encounter Date: 03/17/2023  Check In:  Session Check In - 03/17/23 0824       Check-In   Supervising physician immediately available to respond to emergencies [facility] MD immediately available    Physician(s) Eligha Bridegroom, NP    Location [facility]    Staff Present Lorin Picket, MS, ACSM-CEP, CCRP, Exercise Physiologist;Olinty Peggye Pitt, MS, ACSM-CEP, Exercise Physiologist;Johnny Hale Bogus, MS, Exercise Physiologist;Jetta Dan Humphreys BS, ACSM-CEP, Exercise Physiologist;Alize Borrayo, RN, BSN    Virtual Visit No    Medication changes reported     No    Fall or balance concerns reported    No    Tobacco Cessation No Change    Warm-up and Cool-down Performed as group-led instruction    Resistance Training Performed Yes    VAD Patient? No    PAD/SET Patient? No      Pain Assessment   Currently in Pain? No/denies    Pain Score 0-No pain    Multiple Pain Sites No             Capillary Blood Glucose: Results for orders placed or performed during the hospital encounter of 03/17/23 (from the past 24 hour(s))  Glucose, capillary     Status: Abnormal   Collection Time: 03/17/23  9:08 AM  Result Value Ref Range   Glucose-Capillary 155 (H) 70 - 99 mg/dL  Glucose, capillary     Status: Abnormal   Collection Time: 03/17/23  9:50 AM  Result Value Ref Range   Glucose-Capillary 131 (H) 70 - 99 mg/dL      Social History   Tobacco Use  Smoking Status Never  Smokeless Tobacco Never    Goals Met:  Exercise tolerated well No report of concerns or symptoms today Strength training completed today  Goals Unmet:  Not Applicable  Comments:  Pt started cardiac rehab today.  Pt tolerated light exercise without difficulty. VSS, telemetry-Sinus Rhythm, asymptomatic.  Medication list reconciled. Pt denies barriers to medicaiton compliance.  PSYCHOSOCIAL ASSESSMENT:  PHQ-0. Pt exhibits positive coping skills, hopeful outlook with supportive family. No psychosocial needs identified at this time, no psychosocial interventions necessary.    Pt enjoys DIY, watching sports and playing the drums.   Pt oriented to exercise equipment and routine.    Understanding verbalized. Manard reported that he did not eat breakfast prior to coming to exercise at cardiac rehab. Patient was given graham crackers and peanut butter. Reminded the patient to eat breakfast before coming to exercise at cardiac rehab. Patient states understanding.Thayer Headings RN BSN    Dr. Armanda Magic is Medical Director for Cardiac Rehab at [facility].

## 2023-03-19 ENCOUNTER — Encounter (HOSPITAL_COMMUNITY)
Admission: RE | Admit: 2023-03-19 | Discharge: 2023-03-19 | Disposition: A | Discharge status: Home / Self Care | Payer: Medicaid Other | Source: Ambulatory Visit | Attending: Cardiology | Admitting: Cardiology

## 2023-03-19 DIAGNOSIS — Z941 Heart transplant status: Secondary | ICD-10-CM

## 2023-03-19 LAB — GLUCOSE, CAPILLARY
Glucose-Capillary: 195 mg/dL — ABNORMAL HIGH (ref 70–99)
Glucose-Capillary: 216 mg/dL — ABNORMAL HIGH (ref 70–99)

## 2023-03-19 NOTE — Progress Notes (Signed)
QUALITY OF LIFE SCORE REVIEW  Pt completed Quality of Life survey as a participant in Cardiac Rehab.  Scores 21.0 or below are considered low.  Pt score very low in several areas Overall 17.87, Health and Function 18.04, socioeconomic 9.67, physiological and spiritual 23.0, family 20.63. Patient quality of life slightly altered by physical constraints which limits ability to perform as prior to recent cardiac illness.  Offered emotional support and reassurance.  Will continue to monitor and intervene as necessary. Patient's left eyelid is noted to be swollen. Jason Stevenson says his left eyelid is sore to touch. I encouraged Jason Stevenson to follow up with his PCP regarding this. Jason Stevenson says that he and his wife are struggling to pay bills and rent. Jason Stevenson says he is receiving disability but does not think he is receiving enough to make ends meet. Jason Stevenson says he will reach out to his transplant coordinator/ social worker regarding this.  Jason Headings RN BSN

## 2023-03-20 ENCOUNTER — Encounter: Payer: Self-pay | Admitting: Internal Medicine

## 2023-03-20 ENCOUNTER — Ambulatory Visit: Payer: Medicaid Other | Attending: Internal Medicine | Admitting: Internal Medicine

## 2023-03-20 ENCOUNTER — Telehealth: Payer: Self-pay | Admitting: Internal Medicine

## 2023-03-20 ENCOUNTER — Telehealth (HOSPITAL_COMMUNITY): Payer: Self-pay

## 2023-03-20 VITALS — BP 119/78 | HR 95 | Temp 97.9°F | Ht 72.0 in | Wt 175.0 lb

## 2023-03-20 DIAGNOSIS — I428 Other cardiomyopathies: Secondary | ICD-10-CM

## 2023-03-20 DIAGNOSIS — G40909 Epilepsy, unspecified, not intractable, without status epilepticus: Secondary | ICD-10-CM

## 2023-03-20 DIAGNOSIS — E1142 Type 2 diabetes mellitus with diabetic polyneuropathy: Secondary | ICD-10-CM | POA: Diagnosis not present

## 2023-03-20 DIAGNOSIS — E785 Hyperlipidemia, unspecified: Secondary | ICD-10-CM | POA: Diagnosis not present

## 2023-03-20 DIAGNOSIS — G40209 Localization-related (focal) (partial) symptomatic epilepsy and epileptic syndromes with complex partial seizures, not intractable, without status epilepticus: Secondary | ICD-10-CM | POA: Diagnosis not present

## 2023-03-20 DIAGNOSIS — Z794 Long term (current) use of insulin: Secondary | ICD-10-CM

## 2023-03-20 DIAGNOSIS — Z5982 Transportation insecurity: Secondary | ICD-10-CM

## 2023-03-20 DIAGNOSIS — Z941 Heart transplant status: Secondary | ICD-10-CM | POA: Diagnosis not present

## 2023-03-20 DIAGNOSIS — H01004 Unspecified blepharitis left upper eyelid: Secondary | ICD-10-CM

## 2023-03-20 DIAGNOSIS — I6930 Unspecified sequelae of cerebral infarction: Secondary | ICD-10-CM | POA: Diagnosis not present

## 2023-03-20 DIAGNOSIS — E1169 Type 2 diabetes mellitus with other specified complication: Secondary | ICD-10-CM

## 2023-03-20 DIAGNOSIS — I255 Ischemic cardiomyopathy: Secondary | ICD-10-CM | POA: Diagnosis not present

## 2023-03-20 DIAGNOSIS — Z5986 Financial insecurity: Secondary | ICD-10-CM | POA: Diagnosis not present

## 2023-03-20 DIAGNOSIS — R569 Unspecified convulsions: Secondary | ICD-10-CM | POA: Diagnosis not present

## 2023-03-20 DIAGNOSIS — Z8673 Personal history of transient ischemic attack (TIA), and cerebral infarction without residual deficits: Secondary | ICD-10-CM | POA: Diagnosis not present

## 2023-03-20 DIAGNOSIS — E139 Other specified diabetes mellitus without complications: Secondary | ICD-10-CM

## 2023-03-20 NOTE — Progress Notes (Signed)
Daily Session Note for Cardiac Rehab  Patient Details  Name: Jason Stevenson MRN: [id number] Date of Birth: [birth year] Referring Provider:   Flowsheet Row CARDIAC REHAB PHASE II ORIENTATION from 03/06/2023 in [facility]  Referring Provider Dr. Edwena Blow ([name], MD covering)       Pt asked for resources for rent and utility assistance. RN advised that she will reach out to Child psychotherapist for information.

## 2023-03-20 NOTE — Patient Instructions (Signed)
Blepharitis Blepharitis refers to inflammation of the eyelids. It is a common condition and can cause dryness or grittiness in the eyes. Other symptoms may include: Reddish, scaly skin around the scalp and eyebrows. Burning or itching of the eyelids. Eye discharge at night that causes the eyelashes to stick together in the morning. Eyelashes that fall out. Redness of the eyes. Sensitivity to light. Follow these instructions at home: Pay attention to any changes in how your eyes look or feel. Tell your health care provider about any changes. Follow these instructions to help with your condition. Keeping clean Wash your hands often with soap and water for at least 20 seconds. Clean your eyes and wash the edges of your eyelids with diluted baby shampoo or commercial eyelid wipes. Do this 2 or more times a day. Wash your face and eyebrows at least once a day. Use a clean towel each time you dry your eyelids. Do not use this towel to clean or dry other areas of your body. Do not share your towel with anyone. General instructions Avoid wearing makeup until you get better. Do not share makeup with anyone. Avoid rubbing your eyes. Use warm compresses on the eyes for 5-10 minutes. Do this 1 or 2 times a day, or as told by your health care provider. You can use warm water on a towel, but a microwaveable heating pad often stays warm longer. The pad should be very warm but not hot enough to burn the skin. If you were prescribed an antibiotic ointment or steroid drops, apply or use the medicine as told by your health care provider. Do not stop using the medicine even if you feel better. Keep all follow-up visits. This is important. Contact a health care provider if: Your eyelids feel hot. You have blisters or a rash on your eyelids. The inflammation gets worse or does not go away in 2-4 days. Get help right away if: You have pain or redness that gets worse or spreads to other parts of your face. Your  vision changes. You have pain when looking at lights or moving objects. You have a fever. Summary Blepharitis refers to inflammation of the eyelids. It can cause dryness and grittiness in the eyes. Pay attention to any changes in how your eyes look or feel. Tell your health care provider about any changes. Follow home care instructions as told by your health care provider. Wash your hands often with soap and water for at least 20 seconds. Avoid wearing makeup. Do not rub your eyes. If you were prescribed an antibiotic ointment or steroid drops, apply or use the medicine as told by your health care provider. Get help right away if you have a fever, vision changes, pain or redness that gets worse or spreads to other parts of your face, or pain when looking at lights or moving objects. This information is not intended to replace advice given to you by your health care provider. Make sure you discuss any questions you have with your health care provider. Document Revised: 05/03/2020 Document Reviewed: 05/03/2020 Elsevier Patient Education  2024 [employer].

## 2023-03-20 NOTE — Telephone Encounter (Addendum)
RN called pt's PCP at [facility] to have social worker follow up with rent and utility resources. Spoke with Helmut Muster who will pass message along to their social worker to reach out to patient.

## 2023-03-20 NOTE — Telephone Encounter (Signed)
I spoke to the patient and his wife.  They explained that they have fallen behind with payments when he had his heart transplant and she was off work on [employer] and was not paid.  She said they have reached out to community agencies but have not had any luck. DSS did provide some financial assistance a couple of months ago.  Their monthly rent is $795 and they currently owe $2800 rent and they also owe $200.11 for utilities.   I explained that I can refer them to [facility] for possible assistance and they were in agreement and that referral was placed

## 2023-03-20 NOTE — Telephone Encounter (Signed)
[name] Dandy, nurse from Cardiac Rehab at [facility] has called stating that the patient has asked for resources for utility and rent assistance and [name] Dandy states there is not a Sports coach with cardiac rehab. [name] Dandy would like to know if the case manager can reach out to this patient and provide him with options or information?  Patient's callback # [phone]

## 2023-03-20 NOTE — Progress Notes (Signed)
Patient ID: Jason Stevenson, male    DOB: 05-27-1985  MRN: [id number]  CC: Belepharitis (Blepharitis X3days , light pain, itchy/No to flu vax.)   Subjective: Jason Stevenson is a 37 y.o. male who presents for UC visit.  Wife is with him His concerns today include:  Patient with history of DM with painful neuropathy, HTN, HL, NICM with systolic CHF EF <20%, ICD ( Cardiologist Dr. 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  I last saw him in July of this year.  Since then he has had his heart transplant through [facility].  I wanted to catch up on some of his chronic disease management and patient was okay with me doing that today.   HTN/NICM/CHF: had heart transplant 11/27/2022.  On Prednisone 7.5 mg daily, Prograff, Bactrim, Valganciclovir and Cellcept.  Followed by the transplant clinic at [facility] regularly.  So far no signs of rejection On amlodipine 5 mg daily for blood pressure.  SZ ds:  saw [facility] neurology today.  They decided to keep him on current dose of Keppra and Trileptal.  They have scheduled an EEG for 04/24/2022.  No seizures since transplant surgery.  DM: saw Dr. Lafe Stevenson 01/2023; has appt with [facility] endocrinology this coming Monday.  Last A1C was 12/2022 was 8.7 Currently on Humalog 14/16/16 (plus SS) and Lantus 28 units daily Has CGM with him today TIR 7 days - 33% in TIR, 66% above 180, 1% low TIR 14 days- 49% in target range, 48% above Reports BS 120-190s in a.m, yesterday was 132.   He feels he is doing okay with his eating habits.  Trying to avoid eating sweet snacks.  Appetite has significantly increased since being on prednisone. -Had eye exam done recently.  He was able to give me the name of the provider.  I have sent a request for a report so that I  can update his health maintenance. In regards to his cholesterol, most recent LDL cholesterol was around 190.  Repatha was stopped by the transplant team.  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   Poorly controlled type 2 diabetes mellitus with circulatory disorder (HCC) 03/19/2021   Ischemic cardiomyopathy    Syncope 12/14/2020   Chest pain 10/30/2020   History of cerebrovascular accident (CVA) with residual deficit 10/27/2020   Acute CVA (cerebrovascular accident) (HCC) 09/21/2020   Near syncope 09/21/2020   LV (left ventricular) mural thrombus 09/13/2020   Seizure (HCC) 09/13/2020   Neurological deficit present 09/12/2020   History of COVID-19 09/12/2020   Intracerebral hemorrhage 09/12/2020   Acute cerebrovascular accident (CVA) (HCC) 09/12/2020   COVID-19 08/23/2020   Noncompliance with medication treatment due to intermittent use of medication 10/12/2018   Dyslipidemia 04/17/2016   Chronic systolic CHF (congestive heart failure) (HCC) 06/18/2015   Needs flu shot 12/23/2013   Diabetes mellitus (HCC) 06/17/2013   Non-ischemic cardiomyopathy (HCC) 11/06/2012   HTN (hypertension) 11/06/2012   HLD (hyperlipidemia) 11/06/2012     Current Outpatient Medications on File Prior to Visit  Medication Sig Dispense Refill   ACCU-CHEK  GUIDE test strip USE TO CHECK BLOOD SUGAR THREE TIMES DAILY. 100 each 2   acetaminophen (TYLENOL) 325 MG tablet Take 1-2 tablets (325-650 mg total) by mouth every 4 (four) hours as needed for mild pain.     amLODipine (NORVASC) 5 MG tablet Take 5 mg by mouth daily.     aspirin EC 81 MG tablet Take 81 mg by mouth daily. Swallow whole.     Blood Glucose Monitoring Suppl (ACCU-CHEK GUIDE ME) w/Device KIT  USE TO CHECK BLOOD SUGAR THREE TIMES DAILY. 1 kit 0   Blood Pressure Monitor DEVI Use as directed to check home blood pressure 2-3 times a week 1 Device 0   Calcium Carbonate-Vitamin D (CALTRATE 600+D PO) Take 600 mg by mouth in the morning and at bedtime.     Continuous Glucose Sensor (FREESTYLE LIBRE 2 SENSOR) MISC USE EVERY 14 (FOURTEEN) DAYS. 6 each 1   DULoxetine (CYMBALTA) 60 MG capsule Take 60 mg by mouth daily.     EASY COMFORT PEN NEEDLES 32G X 4 MM MISC USE TO INJECT INSULIN FOUR TIMES A DAY 200 each 2   furosemide (LASIX) 20 MG tablet TAKE 1 TABLET (20 MG TOTAL) BY MOUTH 3 (THREE) TIMES A WEEK. MONDAYS WEDNESDAYS AND FRIDAY (Patient taking differently: Take 20 mg by mouth daily as needed.) 30 tablet 11   GLUCAGON NA Place 3 sprays into the nose once as needed.     insulin glargine (LANTUS) 100 UNIT/ML injection Inject 24 Units into the skin daily.     insulin lispro (HUMALOG KWIKPEN) 100 UNIT/ML KwikPen Inject 12 Units into the skin.     Insulin Syringe-Needle U-100 (RELION INSULIN SYRINGE) 31G X 15/64" 0.3 ML MISC Use to inject insulin daily. 100 each 11   levETIRAcetam (KEPPRA) 750 MG tablet TAKE 2 TABLETS BY MOUTH 2 (TWO) TIMES DAILY (2AM+ 2EVENING) 360 tablet 1   mycophenolate (CELLCEPT) 250 MG capsule Take 250 mg by mouth every 12 (twelve) hours. Take 4 tablets Q 12 hours     Oxcarbazepine (TRILEPTAL) 300 MG tablet TAKE 2 TABLETS (600 MG TOTAL) BY MOUTH 2 (TWO) TIMES DAILY (AM+EVENING) 360 tablet 1   pantoprazole (PROTONIX) 40 MG tablet Take 40 mg by mouth daily.     predniSONE (DELTASONE) 5 MG tablet Take 7.5 mg by mouth daily with breakfast. Take 1 tablets once a day     pregabalin (LYRICA) 300 MG capsule TAKE 1 CAPSULE (300 MG TOTAL) BY MOUTH AT BEDTIME. (Patient taking differently: Take 150 mg by mouth at bedtime.) 90 capsule 1   propranolol (INDERAL) 10 MG tablet Take 10 mg by mouth 2 (two) times daily.     rosuvastatin (CRESTOR) 10 MG tablet Take 20 mg by mouth daily.      sulfamethoxazole-trimethoprim (BACTRIM) 400-80 MG tablet Take 1 tablet by mouth once. Take 1 po daily     tacrolimus (PROGRAF) 1 MG capsule Take 1 mg by mouth 2 (two) times daily. Take 7 capsules in the morning and 7 capsules every evening     traZODone (DESYREL) 50 MG tablet Take 50 mg by mouth at bedtime as needed for sleep.     valGANciclovir (VALCYTE) 450 MG tablet Take by mouth daily. Take 2 tablets by mouth every evening for 180 days     No current facility-administered medications on file prior to visit.    No Known Allergies  Social History   Socioeconomic History   Marital status: Married    Spouse name: Lysle Dingwall   Number  of children: 0   Years of education: 13   Highest education level: Some college, no degree  Occupational History   Occupation: unemployed  Tobacco Use   Smoking status: Never   Smokeless tobacco: Never  Vaping Use   Vaping status: Not on file  Substance and Sexual Activity   Alcohol use: Not Currently    Comment: "occasional" when "hanging out with the wrong people" No recent use.   Drug use: Not Currently    Types: Marijuana    Comment: occasional, last 2013   Sexual activity: Not Currently  Other Topics Concern   Not on file  Social History Narrative   Pt lives at home with his wife    Right handed    Caffeine- hardly any   Social Determinants of Health   Financial Resource Strain: Medium Risk (03/20/2023)   Overall Financial Resource Strain (CARDIA)    Difficulty of Paying Living Expenses: Somewhat hard  Food Insecurity: Food Insecurity Present (03/20/2023)   Hunger Vital Sign    Worried About Running Out of Food in the Last Year: Sometimes true    Ran Out of Food in the Last Year: Sometimes true  Transportation Needs: Unmet Transportation Needs (03/20/2023)   PRAPARE - Administrator, Civil Service (Medical): Yes    Lack of Transportation (Non-Medical): Yes  Physical Activity: Inactive (03/20/2023)   Exercise Vital Sign    Days  of Exercise per Week: 0 days    Minutes of Exercise per Session: 0 min  Stress: Stress Concern Present (03/20/2023)   [employer] of Occupational Health - Occupational Stress Questionnaire    Feeling of Stress : Very much  Social Connections: Moderately Integrated (03/20/2023)   Social Connection and Isolation Panel [NHANES]    Frequency of Communication with Friends and Family: Once a week    Frequency of Social Gatherings with Friends and Family: Never    Attends Religious Services: More than 4 times per year    Active Member of Clubs or Organizations: Yes    Attends Banker Meetings: More than 4 times per year    Marital Status: Married  Catering manager Violence: Not At Risk (03/20/2023)   Humiliation, Afraid, Rape, and Kick questionnaire    Fear of Current or Ex-Partner: No    Emotionally Abused: No    Physically Abused: No    Sexually Abused: No    Family History  Problem Relation Age of Onset   Stroke Mother    Diabetes Mother    Hypertension Mother    Stroke Maternal Aunt    Heart attack Neg Hx     Past Surgical History:  Procedure Laterality Date   ICD IMPLANT N/A 08/27/2021   Procedure: ICD IMPLANT;  Surgeon: Lanier Prude, MD;  Location: [facility] INVASIVE CV LAB;  Service: Cardiovascular;  Laterality: N/A;   None     RIGHT HEART CATH  06/21/2021   [facility]   RIGHT HEART CATH N/A 05/17/2022   Procedure: RIGHT HEART CATH;  Surgeon: Laurey Morale, MD;  Location: [facility] INVASIVE CV LAB;  Service: Cardiovascular;  Laterality: N/A;   RIGHT/LEFT HEART CATH AND CORONARY ANGIOGRAPHY N/A 11/02/2020   Procedure: RIGHT/LEFT HEART CATH AND CORONARY ANGIOGRAPHY;  Surgeon: Laurey Morale, MD;  Location: [facility] INVASIVE CV LAB;  Service: Cardiovascular;  Laterality: N/A;    ROS: Review of Systems Negative except as stated above  PHYSICAL EXAM: BP 119/78 (BP Location: Left Arm, Patient Position: Sitting, Cuff Size: Normal)  Pulse 95   Temp 97.9 F (36.6 C)  (Oral)   Ht 6' (1.829 m)   Wt 175 lb (79.4 kg)   SpO2 99%   BMI 23.73 kg/m   Physical Exam   General appearance - alert, well appearing, and in no distress Mental status - normal mood, behavior, speech, dress, motor activity, and thought processes Eyes -mild edema of the left upper eyelid.  No conjunctival injection.  Extraocular movement intact in both eyes. Chest - clear to auscultation, no wheezes, rales or rhonchi, symmetric air entry Heart - normal rate, regular rhythm, normal S1, S2, no murmurs, rubs, clicks or gallops Extremities -no lower extremity edema     Latest Ref Rng & Units 10/25/2022   10:12 AM 06/28/2022   11:43 AM 06/04/2022   10:41 PM  CMP  Glucose 70 - 99 mg/dL 295  621  308   BUN 6 - 20 mg/dL 16  24  14    Creatinine 0.76 - 1.27 mg/dL 6.57  8.46  9.62   Sodium 134 - 144 mmol/L 142  134  140   Potassium 3.5 - 5.2 mmol/L 4.9  4.9  4.1   Chloride 96 - 106 mmol/L 102  100  102   CO2 20 - 29 mmol/L 27  25  28    Calcium 8.7 - 10.2 mg/dL 9.0  8.6  9.0    Lipid Panel     Component Value Date/Time   CHOL 175 04/30/2022 1106   CHOL 107 06/08/2021 0851   TRIG 82 04/30/2022 1106   HDL 61 04/30/2022 1106   HDL 49 06/08/2021 0851   CHOLHDL 2.9 04/30/2022 1106   VLDL 16 04/30/2022 1106   LDLCALC 98 04/30/2022 1106   LDLCALC 31 06/08/2021 0851    CBC    Component Value Date/Time   WBC 3.8 10/25/2022 1012   WBC 7.0 06/04/2022 2241   RBC 4.47 10/25/2022 1012   RBC 4.69 06/04/2022 2241   HGB 12.8 (L) 10/25/2022 1012   HCT 39.1 10/25/2022 1012   PLT 195 10/25/2022 1012   MCV 88 10/25/2022 1012   MCH 28.6 10/25/2022 1012   MCH 29.6 06/04/2022 2241   MCHC 32.7 10/25/2022 1012   MCHC 33.3 06/04/2022 2241   RDW 12.2 10/25/2022 1012   LYMPHSABS 2.2 06/04/2022 2241   LYMPHSABS 2.0 08/03/2021 1313   MONOABS 0.4 06/04/2022 2241   EOSABS 0.1 06/04/2022 2241   EOSABS 0.1 08/03/2021 1313   BASOSABS 0.1 06/04/2022 2241   BASOSABS 0.1 08/03/2021 1313     ASSESSMENT AND PLAN: 1. Blepharitis of left upper eyelid, unspecified type Discussed diagnosis and management with patient.  Recommend using clean warm compresses to the eye 3 times a day for 10 minutes.  If no improvement or any worsening, he should let me know so that we can refer to ophthalmology.  2. Seizure disorder [facility]) Now followed by [facility] neurology.  He will continue Keppra and Trileptal  3. 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 Type 2 diabetes mellitus with diabetic polyneuropathy, with long-term current use of insulin (HCC) Based on his continuous glucose monitor, time within target range is much lower than we would like it to be.  I recommend changing his Humalog to 16/18/18 and continuing Lantus insulin 26 units daily.  Keep upcoming appointment with [facility] endocrinology next week Dietary counseling given.  Advised snacking on healthy foods like fruits.  5. Hyperlipidemia associated with type 2 diabetes mellitus (HCC) Continue rosuvastatin.  Advised to  ask about increased dose given his recent LDL was not at goal  6. Heart transplant recipient [facility]) Followed by [facility] transplant team.   Patient was given the opportunity to ask questions.  Patient verbalized understanding of the plan and was able to repeat key elements of the plan.   This documentation was completed using Paediatric nurse.  Any transcriptional errors are unintentional.  No orders of the defined types were placed in this encounter.    Requested Prescriptions    No prescriptions requested or ordered in this encounter    Return in about 4 months (around 07/19/2023).  [name], MD, FACP

## 2023-03-21 ENCOUNTER — Encounter (HOSPITAL_COMMUNITY)
Admission: RE | Admit: 2023-03-21 | Discharge: 2023-03-21 | Disposition: A | Discharge status: Home / Self Care | Payer: Medicaid Other | Source: Ambulatory Visit | Attending: Cardiology

## 2023-03-21 DIAGNOSIS — Z941 Heart transplant status: Secondary | ICD-10-CM | POA: Diagnosis not present

## 2023-03-21 LAB — GLUCOSE, CAPILLARY: Glucose-Capillary: 171 mg/dL — ABNORMAL HIGH (ref 70–99)

## 2023-03-24 ENCOUNTER — Telehealth (HOSPITAL_COMMUNITY): Payer: Self-pay | Admitting: *Deleted

## 2023-03-24 ENCOUNTER — Encounter (HOSPITAL_COMMUNITY): Payer: Medicaid Other

## 2023-03-24 NOTE — Telephone Encounter (Signed)
Patient left message on department voicemail on 03/21/23 that he would be absent from cardiac rehab on Monday 03/24/23 due to several appointment at [facility].

## 2023-03-25 NOTE — Progress Notes (Signed)
Cardiac Individual Treatment Plan  Patient Details  Name: Jason Stevenson MRN: [id number] Date of Birth: [birth year] Referring Provider:   Flowsheet Row CARDIAC REHAB PHASE II ORIENTATION from 03/06/2023 in [facility]  Referring Provider Dr. Edwena Blow ([name], MD covering)       Initial Encounter Date:  Flowsheet Row CARDIAC REHAB PHASE II ORIENTATION from 03/06/2023 in [facility]  Date 03/06/23       Visit Diagnosis: 11/26/22 S/P orthotopic heart transplant (HCC)  Patient's Home Medications on Admission:  Current Outpatient Medications:    ACCU-CHEK GUIDE test strip, USE TO CHECK BLOOD SUGAR THREE TIMES DAILY., Disp: 100 each, Rfl: 2   acetaminophen (TYLENOL) 325 MG tablet, Take 1-2 tablets (325-650 mg total) by mouth every 4 (four) hours as needed for mild pain., Disp: , Rfl:    amLODipine (NORVASC) 5 MG tablet, Take 5 mg by mouth daily., Disp: , Rfl:    aspirin EC 81 MG tablet, Take 81 mg by mouth daily. Swallow whole., Disp: , Rfl:    Blood Glucose Monitoring Suppl (ACCU-CHEK GUIDE ME) w/Device KIT, USE TO CHECK BLOOD SUGAR THREE TIMES DAILY., Disp: 1 kit, Rfl: 0   Blood Pressure Monitor DEVI, Use as directed to check home blood pressure 2-3 times a week, Disp: 1 Device, Rfl: 0   Calcium Carbonate-Vitamin D (CALTRATE 600+D PO), Take 600 mg by mouth in the morning and at bedtime., Disp: , Rfl:    Continuous Glucose Sensor (FREESTYLE LIBRE 2 SENSOR) MISC, USE EVERY 14 (FOURTEEN) DAYS., Disp: 6 each, Rfl: 1   DULoxetine (CYMBALTA) 60 MG capsule, Take 60 mg by mouth daily., Disp: , Rfl:    EASY COMFORT PEN NEEDLES 32G X 4 MM MISC, USE TO INJECT INSULIN FOUR TIMES A DAY, Disp: 200 each, Rfl: 2   furosemide (LASIX) 20 MG tablet, TAKE 1 TABLET (20 MG TOTAL) BY MOUTH 3 (THREE) TIMES A WEEK. MONDAYS WEDNESDAYS AND FRIDAY (Patient taking differently: Take 20 mg by mouth  daily as needed.), Disp: 30 tablet, Rfl: 11   GLUCAGON NA, Place 3 sprays into the nose once as needed., Disp: , Rfl:    insulin glargine (LANTUS) 100 UNIT/ML injection, Inject 24 Units into the skin daily., Disp: , Rfl:    insulin lispro (HUMALOG KWIKPEN) 100 UNIT/ML KwikPen, Inject 12 Units into the skin., Disp: , Rfl:    Insulin Syringe-Needle U-100 (RELION INSULIN SYRINGE) 31G X 15/64" 0.3 ML MISC, Use to inject insulin daily., Disp: 100 each, Rfl: 11   levETIRAcetam (KEPPRA) 750 MG tablet, TAKE 2 TABLETS BY MOUTH 2 (TWO) TIMES DAILY (2AM+ 2EVENING), Disp: 360 tablet, Rfl: 1   mycophenolate (CELLCEPT) 250 MG capsule, Take 250 mg by mouth every 12 (twelve) hours. Take 4 tablets Q 12 hours, Disp: , Rfl:    Oxcarbazepine (TRILEPTAL) 300 MG tablet, TAKE 2 TABLETS (600 MG TOTAL) BY MOUTH 2 (TWO) TIMES DAILY (AM+EVENING), Disp: 360 tablet, Rfl: 1   pantoprazole (PROTONIX) 40 MG tablet, Take 40 mg by mouth daily., Disp: , Rfl:    predniSONE (DELTASONE) 5 MG tablet, Take 7.5 mg by mouth daily with breakfast. Take 1 tablets once a day, Disp: , Rfl:    pregabalin (LYRICA) 300 MG capsule, TAKE 1 CAPSULE (300 MG TOTAL) BY MOUTH AT BEDTIME. (Patient taking differently: Take 150 mg by mouth at bedtime.), Disp: 90 capsule, Rfl: 1   propranolol (INDERAL) 10 MG tablet, Take  10 mg by mouth 2 (two) times daily., Disp: , Rfl:    rosuvastatin (CRESTOR) 10 MG tablet, Take 20 mg by mouth daily., Disp: , Rfl:    sulfamethoxazole-trimethoprim (BACTRIM) 400-80 MG tablet, Take 1 tablet by mouth once. Take 1 po daily, Disp: , Rfl:    tacrolimus (PROGRAF) 1 MG capsule, Take 1 mg by mouth 2 (two) times daily. Take 7 capsules in the morning and 7 capsules every evening, Disp: , Rfl:    traZODone (DESYREL) 50 MG tablet, Take 50 mg by mouth at bedtime as needed for sleep., Disp: , Rfl:    valGANciclovir (VALCYTE) 450 MG tablet, Take by mouth daily. Take 2 tablets by mouth every evening for 180 days, Disp: , Rfl:   Past Medical  History: Past Medical History:  Diagnosis Date   Chronic systolic CHF (congestive heart failure) (HCC) 06/18/2015   Hemorrhagic stroke (HCC)    Hyperlipidemia    Hypertension    Immune deficiency disorder (HCC)    Nonischemic cardiomyopathy (HCC) Noted as early as 2007   Per chart review (cards consult note 2011), EF of 40% in 2007, down to 20-25% in 2011    Tobacco Use: Social History   Tobacco Use  Smoking Status Never  Smokeless Tobacco Never    Labs: Review Flowsheet  More data exists      Latest Ref Rng & Units 03/21/2022 04/30/2022 05/17/2022 07/29/2022 10/22/2022  Labs for ITP Cardiac and Pulmonary Rehab  Cholestrol 0 - 200 mg/dL - 161  - - -  LDL (calc) 0 - 99 mg/dL - 98  - - -  HDL-C >09 mg/dL - 61  - - -  Trlycerides <150 mg/dL - 82  - - -  Hemoglobin A1c 4.6 - 6.5 % 7.6  - - 9.2  7.6   Bicarbonate 20.0 - 28.0 mmol/L 20.0 - 28.0 mmol/L - - 27.4  27.2  - -  TCO2 22 - 32 mmol/L 22 - 32 mmol/L - - 29  29  - -  O2 Saturation % % - - 70  73  - -    Details       Multiple values from one day are sorted in reverse-chronological order         Capillary Blood Glucose: Lab Results  Component Value Date   GLUCAP 171 (H) 03/21/2023   GLUCAP 216 (H) 03/19/2023   GLUCAP 195 (H) 03/19/2023   GLUCAP 131 (H) 03/17/2023   GLUCAP 155 (H) 03/17/2023     Exercise Target Goals: Exercise Program Goal: Individual exercise prescription set using results from initial 6 min walk test and THRR while considering  patient's activity barriers and safety.   Exercise Prescription Goal: Initial exercise prescription builds to 30-45 minutes a day of aerobic activity, 2-3 days per week.  Home exercise guidelines will be given to patient during program as part of exercise prescription that the participant will acknowledge.  Activity Barriers & Risk Stratification:  Activity Barriers & Cardiac Risk Stratification - 03/06/23 1123       Activity Barriers & Cardiac Risk Stratification    Activity Barriers Back Problems;Deconditioning;Neck/Spine Problems;Shortness of Breath;Balance Concerns;Other (comment)    Comments Pt voices he feels his balance is off sometimes: Achieved 30 seconds on balance test.    Cardiac Risk Stratification High             6 Minute Walk:  6 Minute Walk     Row Name 03/06/23 1012  6 Minute Walk   Phase Initial     Distance 1358 feet     Walk Time 6 minutes     # of Rest Breaks 0     MPH 2.6     METS 4.95     RPE 11     Perceived Dyspnea  1     VO2 Peak 17.32     Symptoms Yes (comment)     Comments SOB, RPD = 1     Resting HR 92 bpm     Resting BP 112/86     Resting Oxygen Saturation  99 %     Exercise Oxygen Saturation  during 6 min walk 98 %     Max Ex. HR 97 bpm     Max Ex. BP 122/80     2 Minute Post BP 110/78              Oxygen Initial Assessment:   Oxygen Re-Evaluation:   Oxygen Discharge (Final Oxygen Re-Evaluation):   Initial Exercise Prescription:  Initial Exercise Prescription - 03/06/23 1100       Date of Initial Exercise RX and Referring Provider   Date 03/06/23    Referring Provider Dr. Edwena Blow [name], MD covering)    Expected Discharge Date 05/27/22      Bike   Level 1.5    Watts 50    Minutes 15    METs 4.95      NuStep   Level 2    SPM 80    Minutes 15    METs 4.95      Prescription Details   Frequency (times per week) 3    Duration Progress to 30 minutes of continuous aerobic without signs/symptoms of physical distress      Intensity   THRR 40-80% of Max Heartrate 72-145    Ratings of Perceived Exertion 11-13    Perceived Dyspnea 0-4      Progression   Progression Continue progressive overload as per policy without signs/symptoms or physical distress.      Resistance Training   Training Prescription Yes    Weight 4 lbs    Reps 10-15             Perform Capillary Blood Glucose checks as needed.  Exercise Prescription Changes:   Exercise  Prescription Changes     Row Name 03/17/23 1015             Response to Exercise   Blood Pressure (Admit) 136/88       Blood Pressure (Exercise) 148/88       Blood Pressure (Exit) 128/80       Heart Rate (Admit) 97 bpm       Heart Rate (Exercise) 115 bpm       Heart Rate (Exit) 106 bpm       Rating of Perceived Exertion (Exercise) 13       Symptoms Pt voices fatigue after exercise       Comments Pt's first day in the CRP2 program       Duration Progress to 30 minutes of  aerobic without signs/symptoms of physical distress       Intensity THRR unchanged         Progression   Progression Continue to progress workloads to maintain intensity without signs/symptoms of physical distress.       Average METs 2.95         Resistance Training   Training Prescription Yes  Weight 4 lbs       Reps 10-15       Time 10 Minutes         Interval Training   Interval Training No         Bike   Level 1.5       Watts 26       Minutes 8       METs 3.1         NuStep   Level 2       SPM 97       Minutes 15       METs 2.8                Exercise Comments:   Exercise Comments     Row Name 03/17/23 1015           Exercise Comments Will continue to montior patient and progress exercise workloads as tolerated.                Exercise Goals and Review:   Exercise Goals     Row Name 03/06/23 0823             Exercise Goals   Increase Physical Activity Yes       Intervention Provide advice, education, support and counseling about physical activity/exercise needs.;Develop an individualized exercise prescription for aerobic and resistive training based on initial evaluation findings, risk stratification, comorbidities and participant's personal goals.       Expected Outcomes Short Term: Attend rehab on a regular basis to increase amount of physical activity.;Long Term: Exercising regularly at least 3-5 days a week.;Long Term: Add in home exercise to make exercise  part of routine and to increase amount of physical activity.       Increase Strength and Stamina Yes       Intervention Provide advice, education, support and counseling about physical activity/exercise needs.;Develop an individualized exercise prescription for aerobic and resistive training based on initial evaluation findings, risk stratification, comorbidities and participant's personal goals.       Expected Outcomes Short Term: Increase workloads from initial exercise prescription for resistance, speed, and METs.;Short Term: Perform resistance training exercises routinely during rehab and add in resistance training at home;Long Term: Improve cardiorespiratory fitness, muscular endurance and strength as measured by increased METs and functional capacity ( )       Able to understand and use rate of perceived exertion (RPE) scale Yes       Intervention Provide education and explanation on how to use RPE scale       Expected Outcomes Short Term: Able to use RPE daily in rehab to express subjective intensity level;Long Term:  Able to use RPE to guide intensity level when exercising independently       Knowledge and understanding of Target Heart Rate Range (THRR) Yes       Intervention Provide education and explanation of THRR including how the numbers were predicted and where they are located for reference       Expected Outcomes Short Term: Able to state/look up THRR;Short Term: Able to use daily as guideline for intensity in rehab;Long Term: Able to use THRR to govern intensity when exercising independently       Understanding of Exercise Prescription Yes       Intervention Provide education, explanation, and written materials on patient's individual exercise prescription       Expected Outcomes Long Term: Able to explain home exercise prescription to exercise independently;Short Term:  Able to explain program exercise prescription                Exercise Goals Re-Evaluation :  Exercise Goals  Re-Evaluation     Row Name 03/17/23 1015             Exercise Goal Re-Evaluation   Exercise Goals Review Understanding of Exercise Prescription;Increase Physical Activity;Increase Strength and Stamina;Knowledge and understanding of Target Heart Rate Range (THRR);Able to understand and use rate of perceived exertion (RPE) scale       Comments Pt's first day in the CRP2 program S/P his Heart Transplant. Pt has been in the program before under the diagnosis of CHF. Pt understands the exercise RX, RPE sclae anf THRR.       Expected Outcomes will continue to monitor patient and progress exercise workloads as tolerated.                Discharge Exercise Prescription (Final Exercise Prescription Changes):  Exercise Prescription Changes - 03/17/23 1015       Response to Exercise   Blood Pressure (Admit) 136/88    Blood Pressure (Exercise) 148/88    Blood Pressure (Exit) 128/80    Heart Rate (Admit) 97 bpm    Heart Rate (Exercise) 115 bpm    Heart Rate (Exit) 106 bpm    Rating of Perceived Exertion (Exercise) 13    Symptoms Pt voices fatigue after exercise    Comments Pt's first day in the CRP2 program    Duration Progress to 30 minutes of  aerobic without signs/symptoms of physical distress    Intensity THRR unchanged      Progression   Progression Continue to progress workloads to maintain intensity without signs/symptoms of physical distress.    Average METs 2.95      Resistance Training   Training Prescription Yes    Weight 4 lbs    Reps 10-15    Time 10 Minutes      Interval Training   Interval Training No      Bike   Level 1.5    Watts 26    Minutes 8    METs 3.1      NuStep   Level 2    SPM 97    Minutes 15    METs 2.8             Nutrition:  Target Goals: Understanding of nutrition guidelines, daily intake of sodium 1500mg , cholesterol 200mg , calories 30% from fat and 7% or less from saturated fats, daily to have 5 or more servings of fruits and  vegetables.  Biometrics:  Pre Biometrics - 03/06/23 0927       Pre Biometrics   Waist Circumference 37.5 inches    Hip Circumference 40.5 inches    Waist to Hip Ratio 0.93 %    Triceps Skinfold 11 mm    % Body Fat 22.3 %    Grip Strength 39 kg    Flexibility 14 in    Single Leg Stand 30 seconds              Nutrition Therapy Plan and Nutrition Goals:  Nutrition Therapy & Goals - 03/10/23 1027       Nutrition Therapy   Diet Heart Healthy/ Carbohydrate Consistent DIet    Drug/Food Interactions Statins/Certain Fruits      Personal Nutrition Goals   Nutrition Goal Patient to identify strategies for reducing cardiovascular risk by attending the [employer] education and nutrition series weekly.  Personal Goal #2 Patient to improve diet quality by using the plate method as a guide for meal planning to include lean protein/plant protein, fruits, vegetables, whole grains, nonfat dairy as part of a well-balanced diet.    Personal Goal #3 Patient to identify strategies for blood sugar control with goal A1c <7%    Comments Jason Stevenson has previously completed cardiac rehab in October 2023 prior to heart transplant. He has medical history of LADA treated as type 1 diabetes, HTN, hyperlipidemia, heart transplant 11/26/22. He continues to follow-up with [facility] team and [facility] regarding blood sugar control. His most recent A1c remains above goal (8.7). LDL remains above goal (193). His wife remains an excellent support. Patient will benefit from participation in intensive cardiac rehab for nutrition, exercise, and lifestyle modification.      Intervention Plan   Intervention Prescribe, educate and counsel regarding individualized specific dietary modifications aiming towards targeted core components such as weight, hypertension, lipid management, diabetes, heart failure and other comorbidities.;Nutrition handout(s) given to patient.    Expected Outcomes Short Term Goal:  Understand basic principles of dietary content, such as calories, fat, sodium, cholesterol and nutrients.;Long Term Goal: Adherence to prescribed nutrition plan.             Nutrition Assessments:  MEDIFICTS Score Key: >=70 Need to make dietary changes  40-70 Heart Healthy Diet <= 40 Therapeutic Level Cholesterol Diet   Flowsheet Row CARDIAC REHAB PHASE II EXERCISE from 01/18/2022 in [facility]  Picture Your Plate Total Score on Discharge 64      Picture Your Plate Scores: <16 Unhealthy dietary pattern with much room for improvement. 41-50 Dietary pattern unlikely to meet recommendations for good health and room for improvement. 51-60 More healthful dietary pattern, with some room for improvement.  >60 Healthy dietary pattern, although there may be some specific behaviors that could be improved.    Nutrition Goals Re-Evaluation:  Nutrition Goals Re-Evaluation     Row Name 03/10/23 1027             Goals   Current Weight 176 lb 12.9 oz (80.2 kg)       Comment LDL 193, cholesterol 329, X0R 8.7       Expected Outcome Jason Stevenson has previously completed cardiac rehab in October 2023 prior to heart transplant. He has medical history of LADA treated as type 1 diabetes, HTN, hyperlipidemia, heart transplant 11/26/22. He continues to follow-up with [facility] team and [facility] regarding blood sugar control. His most recent A1c remains above goal (8.7). LDL remains above goal (193). His wife remains an excellent support. Patient will benefit from participation in intensive cardiac rehab for nutrition, exercise, and lifestyle modification.                Nutrition Goals Re-Evaluation:  Nutrition Goals Re-Evaluation     Row Name 03/10/23 1027             Goals   Current Weight 176 lb 12.9 oz (80.2 kg)       Comment LDL 193, cholesterol 329, U0A 8.7       Expected Outcome Jason Stevenson has previously  completed cardiac rehab in October 2023 prior to heart transplant. He has medical history of LADA treated as type 1 diabetes, HTN, hyperlipidemia, heart transplant 11/26/22. He continues to follow-up with [facility] team and [facility] regarding blood sugar control. His most recent A1c remains above goal (8.7). LDL remains  above goal (193). His wife remains an excellent support. Patient will benefit from participation in intensive cardiac rehab for nutrition, exercise, and lifestyle modification.                Nutrition Goals Discharge (Final Nutrition Goals Re-Evaluation):  Nutrition Goals Re-Evaluation - 03/10/23 1027       Goals   Current Weight 176 lb 12.9 oz (80.2 kg)    Comment LDL 193, cholesterol 329, Z6X 8.7    Expected Outcome Jason Stevenson has previously completed cardiac rehab in October 2023 prior to heart transplant. He has medical history of LADA treated as type 1 diabetes, HTN, hyperlipidemia, heart transplant 11/26/22. He continues to follow-up with [facility] and [facility] regarding blood sugar control. His most recent A1c remains above goal (8.7). LDL remains above goal (193). His wife remains an excellent support. Patient will benefit from participation in intensive cardiac rehab for nutrition, exercise, and lifestyle modification.             Psychosocial: Target Goals: Acknowledge presence or absence of significant depression and/or stress, maximize coping skills, provide positive support system. Participant is able to verbalize types and ability to use techniques and skills needed for reducing stress and depression.  Initial Review & Psychosocial Screening:  Initial Psych Review & Screening - 03/06/23 1131       Initial Review   Current issues with Current Anxiety/Panic;Current Stress Concerns    Source of Stress Concerns Financial      Family Dynamics   Good Support System? Yes   Pt has his spouse for support     Barriers    Psychosocial barriers to participate in program The patient should benefit from training in stress management and relaxation.      Screening Interventions   Interventions Encouraged to exercise    Expected Outcomes Short Term goal: Utilizing psychosocial counselor, staff and physician to assist with identification of specific Stressors or current issues interfering with healing process. Setting desired goal for each stressor or current issue identified.;Long Term Goal: Stressors or current issues are controlled or eliminated.;Short Term goal: Identification and review with participant of any Quality of Life or Depression concerns found by scoring the questionnaire.;Long Term goal: The participant improves quality of Life and PHQ9 Scores as seen by post scores and/or verbalization of changes             Quality of Life Scores:  Quality of Life - 03/06/23 1132       Quality of Life   Select Quality of Life      Quality of Life Scores   Health/Function Pre 18.04 %    Socioeconomic Pre 9.67 %    Psych/Spiritual Pre 23 %    Family Pre 20.63 %    GLOBAL Pre 17.87 %            Scores of 19 and below usually indicate a poorer quality of life in these areas.  A difference of  2-3 points is a clinically meaningful difference.  A difference of 2-3 points in the total score of the Quality of Life Index has been associated with significant improvement in overall quality of life, self-image, physical symptoms, and general health in studies assessing change in quality of life.  PHQ-9: Review Flowsheet  More data exists      03/20/2023 03/06/2023 08/08/2022 01/18/2022 11/13/2021  Depression screen PHQ 2/9  Decreased Interest 1 0 0 0 0  Down, Depressed, Hopeless 1 0 1 1 1  PHQ - 2 Score 2 0 1 1 1   Altered sleeping 0 0 0 0 1  Tired, decreased energy 1 0 1 0 1  Change in appetite 0 0 1 0 1  Feeling bad or failure about yourself  0 0 0 1 0  Trouble concentrating 0 0 1 1 1   Moving slowly or  fidgety/restless 0 0 0 1 1  Suicidal thoughts 0 0 0 0 1  PHQ-9 Score 3 0 4 4 7   Difficult doing work/chores Not difficult at all Not difficult at all - - Somewhat difficult    Details           Interpretation of Total Score  Total Score Depression Severity:  1-4 = Minimal depression, 5-9 = Mild depression, 10-14 = Moderate depression, 15-19 = Moderately severe depression, 20-27 = Severe depression   Psychosocial Evaluation and Intervention:   Psychosocial Re-Evaluation:  Psychosocial Re-Evaluation     Row Name 03/18/23 0827 03/20/23 4098           Psychosocial Re-Evaluation   Current issues with Current Stress Concerns;Current Anxiety/Panic Current Stress Concerns;Current Anxiety/Panic      Comments Jason Stevenson did not voice any  increased concerns or stressors on his first day of exercise at cardiac rehab. Will Review quality of life questionnaire in the upcoming week. Quality of life questionnaire reviewed. Jason Stevenson says that he and his wife are struggling to pay bills and rent. Jason Stevenson says he is receiving disability but does not think he is receiving enough to make ends meet. Jason Stevenson says he will reach out to his transplant coordinator/ social worker regarding this. Jason Stevenson says he is able to buy groceries with the assistance he receives.      Expected Outcomes Jason Stevenson will have decreased or controlled anxiety and stress upon completion of cardiac rehab. Jason Stevenson will have decreased or controlled anxiety and stress upon completion of cardiac rehab.      Interventions Stress management education;Encouraged to attend Cardiac Rehabilitation for the exercise;Relaxation education Stress management education;Encouraged to attend Cardiac Rehabilitation for the exercise;Relaxation education      Continue Psychosocial Services  Follow up required by staff Follow up required by staff        Initial Review   Source of Stress Concerns Occupation;Chronic Illness;Unable to participate in former interests  or hobbies;Unable to perform yard/household activities;Financial Occupation;Chronic Illness;Unable to participate in former interests or hobbies;Unable to perform yard/household activities;Financial      Comments Jason Stevenson continue to recover from his heart transplant in October.. Will continue to monitor and offer support as needed Jason Stevenson continue to recover from his heart transplant in October.. Will continue to monitor and offer support as needed               Psychosocial Discharge (Final Psychosocial Re-Evaluation):  Psychosocial Re-Evaluation - 03/20/23 0929       Psychosocial Re-Evaluation   Current issues with Current Stress Concerns;Current Anxiety/Panic    Comments Quality of life questionnaire reviewed. Jason Stevenson says that he and his wife are struggling to pay bills and rent. Jason Stevenson says he is receiving disability but does not think he is receiving enough to make ends meet. Jason Stevenson says he will reach out to his transplant coordinator/ social worker regarding this. Jason Stevenson says he is able to buy groceries with the assistance he receives.    Expected Outcomes Jason Stevenson will have decreased or controlled anxiety and stress upon completion of cardiac rehab.    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 Will continue to monitor and offer support as needed             Vocational Rehabilitation: Provide vocational rehab assistance to qualifying candidates.   Vocational Rehab Evaluation & Intervention:  Vocational Rehab - 03/06/23 0935       Initial Vocational Rehab Evaluation & Intervention    Assessment shows need for Vocational Rehabilitation No   pt on disability            Education: Education Goals: Education classes will be provided on a weekly basis, covering required topics. Participant will state understanding/return demonstration of topics presented.    Education     Row Name 03/10/23 0900     Education   Cardiac Education Topics Pritikin   Select Core Videos     Core Videos   Educator Exercise Physiologist   Select Psychosocial   Psychosocial Healthy Minds, Bodies, Hearts   Instruction Review Code 1- Verbalizes Understanding   Class Start Time 0805   Class Stop Time 0840   Class Time Calculation (min) 35 min    Row Name 03/17/23 0800     Education   Cardiac Education Topics Pritikin   Select Core Videos     Core Videos   Educator Exercise Physiologist   Select Nutrition   Nutrition Other   Instruction Review Code 1- Verbalizes Understanding   Class Start Time [phone]   Class Stop Time 0855   Class Time Calculation (min) 41 min    Row Name 03/19/23 1000     Education   Cardiac Education Topics Pritikin   Secondary school teacher School   Educator Nurse   Weekly Topic Tasty Appetizers and Snacks   Instruction Review Code 1- Verbalizes Understanding   Class Start Time [id number]   Class Stop Time 0843   Class Time Calculation (min) 26 min    Row Name 03/21/23 0900     Education   Cardiac Education Topics Pritikin   Select Workshops     Workshops   Educator Dietitian   Select Nutrition   Nutrition Workshop Label Reading   Instruction Review Code 1- Verbalizes Understanding   Class Start Time 0815   Class Stop Time 0856   Class Time Calculation (min) 41 min            Core Videos: Exercise    Move It!  Clinical staff conducted group or individual video education with verbal and written material and guidebook.  Patient learns the recommended Pritikin exercise program. Exercise with the goal of living a long, healthy  life. Some of the health benefits of exercise include controlled diabetes, healthier blood pressure levels, improved cholesterol levels, improved heart and lung capacity, improved sleep, and better body composition. Everyone should speak with their doctor before starting or changing an exercise routine.  Biomechanical Limitations Clinical staff conducted group or individual video education with verbal and written material and guidebook.  Patient learns how biomechanical limitations can impact exercise and how we can mitigate and possibly overcome limitations to have an impactful and balanced exercise routine.  Body Composition Clinical staff conducted group or individual video education with verbal and written material  and guidebook.  Patient learns that body composition (ratio of muscle mass to fat mass) is a key component to assessing overall fitness, rather than body weight alone. Increased fat mass, especially visceral belly fat, can put [place] at increased risk for metabolic syndrome, type 2 diabetes, heart disease, and even death. It is recommended to combine diet and exercise (cardiovascular and resistance training) to improve your body composition. Seek guidance from your physician and exercise physiologist before implementing an exercise routine.  Exercise Action Plan Clinical staff conducted group or individual video education with verbal and written material and guidebook.  Patient learns the recommended strategies to achieve and enjoy long-term exercise adherence, including variety, self-motivation, self-efficacy, and positive decision making. Benefits of exercise include fitness, good health, weight management, more energy, better sleep, less stress, and overall well-being.  Medical   Heart Disease Risk Reduction Clinical staff conducted group or individual video education with verbal and written material and guidebook.  Patient learns our heart is our most vital organ as it circulates  oxygen, nutrients, white blood cells, and hormones throughout the entire body, and carries waste away. Data supports a plant-based eating plan like the Pritikin Program for its effectiveness in slowing progression of and reversing heart disease. The video provides a number of recommendations to address heart disease.   Metabolic Syndrome and Belly Fat  Clinical staff conducted group or individual video education with verbal and written material and guidebook.  Patient learns what metabolic syndrome is, how it leads to heart disease, and how one can reverse it and keep it from coming back. You have metabolic syndrome if you have 3 of the following 5 criteria: abdominal obesity, high blood pressure, high triglycerides, low HDL cholesterol, and high blood sugar.  Hypertension and Heart Disease Clinical staff conducted group or individual video education with verbal and written material and guidebook.  Patient learns that high blood pressure, or hypertension, is very common in the [place]. Hypertension is largely due to excessive salt intake, but other important risk factors include being overweight, physical inactivity, drinking too much alcohol, smoking, and not eating enough potassium from fruits and vegetables. High blood pressure is a leading risk factor for heart attack, stroke, congestive heart failure, dementia, kidney failure, and premature death. Long-term effects of excessive salt intake include stiffening of the arteries and thickening of heart muscle and organ damage. Recommendations include ways to reduce hypertension and the risk of heart disease.  Diseases of Our Time - Focusing on Diabetes Clinical staff conducted group or individual video education with verbal and written material and guidebook.  Patient learns why the best way to stop diseases of our time is prevention, through food and other lifestyle changes. Medicine (such as prescription pills and surgeries) is often only a  Band-Aid on the problem, not a long-term solution. Most common diseases of our time include obesity, type 2 diabetes, hypertension, heart disease, and cancer. The Pritikin Program is recommended and has been proven to help reduce, reverse, and/or prevent the damaging effects of metabolic syndrome.  Nutrition   Overview of the Pritikin Eating Plan  Clinical staff conducted group or individual video education with verbal and written material and guidebook.  Patient learns about the Pritikin Eating Plan for disease risk reduction. The Pritikin Eating Plan emphasizes a wide variety of unrefined, minimally-processed carbohydrates, like fruits, vegetables, whole grains, and legumes. Go, Caution, and Stop food choices are explained. Plant-based and lean animal proteins are emphasized. Rationale provided for low sodium intake for  blood pressure control, low added sugars for blood sugar stabilization, and low added fats and oils for coronary artery disease risk reduction and weight management.  Calorie Density  Clinical staff conducted group or individual video education with verbal and written material and guidebook.  Patient learns about calorie density and how it impacts the Pritikin Eating Plan. Knowing the characteristics of the food you choose will help you decide whether those foods will lead to weight gain or weight loss, and whether you want to consume more or less of them. Weight loss is usually a side effect of the Pritikin Eating Plan because of its focus on low calorie-dense foods.  Label Reading  Clinical staff conducted group or individual video education with verbal and written material and guidebook.  Patient learns about the Pritikin recommended label reading guidelines and corresponding recommendations regarding calorie density, added sugars, sodium content, and whole grains.  Dining Out - Part 1  Clinical staff conducted group or individual video education with verbal and written material  and guidebook.  Patient learns that restaurant meals can be sabotaging because they can be so high in calories, fat, sodium, and/or sugar. Patient learns recommended strategies on how to positively address this and avoid unhealthy pitfalls.  Facts on Fats  Clinical staff conducted group or individual video education with verbal and written material and guidebook.  Patient learns that lifestyle modifications can be just as effective, if not more so, as many medications for lowering your risk of heart disease. A Pritikin lifestyle can help to reduce your risk of inflammation and atherosclerosis (cholesterol build-up, or plaque, in the artery walls). Lifestyle interventions such as dietary choices and physical activity address the cause of atherosclerosis. A review of the types of fats and their impact on blood cholesterol levels, along with dietary recommendations to reduce fat intake is also included.  Nutrition Action Plan  Clinical staff conducted group or individual video education with verbal and written material and guidebook.  Patient learns how to incorporate Pritikin recommendations into their lifestyle. Recommendations include planning and keeping personal health goals in mind as an important part of their success.  Healthy Mind-Set    Healthy Minds, Bodies, Hearts  Clinical staff conducted group or individual video education with verbal and written material and guidebook.  Patient learns how to identify when they are stressed. Video will discuss the impact of that stress, as well as the many benefits of stress management. Patient will also be introduced to stress management techniques. The way we think, act, and feel has an impact on our hearts.  How Our Thoughts Can Heal Our Hearts  Clinical staff conducted group or individual video education with verbal and written material and guidebook.  Patient learns that negative thoughts can cause depression and anxiety. This can result in negative  lifestyle behavior and serious health problems. Cognitive behavioral therapy is an effective method to help control our thoughts in order to change and improve our emotional outlook.  Additional Videos:  Exercise    Improving Performance  Clinical staff conducted group or individual video education with verbal and written material and guidebook.  Patient learns to use a non-linear approach by alternating intensity levels and lengths of time spent exercising to help burn more calories and lose more body fat. Cardiovascular exercise helps improve heart health, metabolism, hormonal balance, blood sugar control, and recovery from fatigue. Resistance training improves strength, endurance, balance, coordination, reaction time, metabolism, and muscle mass. Flexibility exercise improves circulation, posture, and balance. Seek guidance  from your physician and exercise physiologist before implementing an exercise routine and learn your capabilities and proper form for all exercise.  Introduction to Yoga  Clinical staff conducted group or individual video education with verbal and written material and guidebook.  Patient learns about yoga, a discipline of the coming together of mind, breath, and body. The benefits of yoga include improved flexibility, improved range of motion, better posture and core strength, increased lung function, weight loss, and positive self-image. Yoga's heart health benefits include lowered blood pressure, healthier heart rate, decreased cholesterol and triglyceride levels, improved immune function, and reduced stress. Seek guidance from your physician and exercise physiologist before implementing an exercise routine and learn your capabilities and proper form for all exercise.  Medical   Aging: Enhancing Your Quality of Life  Clinical staff conducted group or individual video education with verbal and written material and guidebook.  Patient learns key strategies and recommendations  to stay in good physical health and enhance quality of life, such as prevention strategies, having an advocate, securing a Health Care Proxy and Power of Attorney, and keeping a list of medications and system for tracking them. It also discusses how to avoid risk for bone loss.  Biology of Weight Control  Clinical staff conducted group or individual video education with verbal and written material and guidebook.  Patient learns that weight gain occurs because we consume more calories than we burn (eating more, moving less). Even if your body weight is normal, you may have higher ratios of fat compared to muscle mass. Too much body fat puts you at increased risk for cardiovascular disease, heart attack, stroke, type 2 diabetes, and obesity-related cancers. In addition to exercise, following the Pritikin Eating Plan can help reduce your risk.  Decoding Lab Results  Clinical staff conducted group or individual video education with verbal and written material and guidebook.  Patient learns that lab test reflects one measurement whose values change over time and are influenced by many factors, including medication, stress, sleep, exercise, food, hydration, pre-existing medical conditions, and more. It is recommended to use the knowledge from this video to become more involved with your lab results and evaluate your numbers to speak with your doctor.   Diseases of Our Time - Overview  Clinical staff conducted group or individual video education with verbal and written material and guidebook.  Patient learns that according to the CDC, 50% to 70% of chronic diseases (such as obesity, type 2 diabetes, elevated lipids, hypertension, and heart disease) are avoidable through lifestyle improvements including healthier food choices, listening to satiety cues, and increased physical activity.  Sleep Disorders Clinical staff conducted group or individual video education with verbal and written material and  guidebook.  Patient learns how good quality and duration of sleep are important to overall health and well-being. Patient also learns about sleep disorders and how they impact health along with recommendations to address them, including discussing with a physician.  Nutrition  Dining Out - Part 2 Clinical staff conducted group or individual video education with verbal and written material and guidebook.  Patient learns how to plan ahead and communicate in order to maximize their dining experience in a healthy and nutritious manner. Included are recommended food choices based on the type of restaurant the patient is visiting.   Fueling a Banker conducted group or individual video education with verbal and written material and guidebook.  There is a strong connection between our food choices and our health.  Diseases like obesity and type 2 diabetes are very prevalent and are in large-part due to lifestyle choices. The Pritikin Eating Plan provides plenty of food and hunger-curbing satisfaction. It is easy to follow, affordable, and helps reduce health risks.  Menu Workshop  Clinical staff conducted group or individual video education with verbal and written material and guidebook.  Patient learns that restaurant meals can sabotage health goals because they are often packed with calories, fat, sodium, and sugar. Recommendations include strategies to plan ahead and to communicate with the manager, chef, or server to help order a healthier meal.  Planning Your Eating Strategy  Clinical staff conducted group or individual video education with verbal and written material and guidebook.  Patient learns about the Pritikin Eating Plan and its benefit of reducing the risk of disease. The Pritikin Eating Plan does not focus on calories. Instead, it emphasizes high-quality, nutrient-rich foods. By knowing the characteristics of the foods, we choose, we can determine their calorie density  and make informed decisions.  Targeting Your Nutrition Priorities  Clinical staff conducted group or individual video education with verbal and written material and guidebook.  Patient learns that lifestyle habits have a tremendous impact on disease risk and progression. This video provides eating and physical activity recommendations based on your personal health goals, such as reducing LDL cholesterol, losing weight, preventing or controlling type 2 diabetes, and reducing high blood pressure.  Vitamins and Minerals  Clinical staff conducted group or individual video education with verbal and written material and guidebook.  Patient learns different ways to obtain key vitamins and minerals, including through a recommended healthy diet. It is important to discuss all supplements you take with your doctor.   Healthy Mind-Set    Smoking Cessation  Clinical staff conducted group or individual video education with verbal and written material and guidebook.  Patient learns that cigarette smoking and tobacco addiction pose a serious health risk which affects millions of people. Stopping smoking will significantly reduce the risk of heart disease, lung disease, and many forms of cancer. Recommended strategies for quitting are covered, including working with your doctor to develop a successful plan.  Culinary   Becoming a Set designer conducted group or individual video education with verbal and written material and guidebook.  Patient learns that cooking at home can be healthy, cost-effective, quick, and puts them in control. Keys to cooking healthy recipes will include looking at your recipe, assessing your equipment needs, planning ahead, making it simple, choosing cost-effective seasonal ingredients, and limiting the use of added fats, salts, and sugars.  Cooking - Breakfast and Snacks  Clinical staff conducted group or individual video education with verbal and written material  and guidebook.  Patient learns how important breakfast is to satiety and nutrition through the entire day. Recommendations include key foods to eat during breakfast to help stabilize blood sugar levels and to prevent overeating at meals later in the day. Planning ahead is also a key component.  Cooking - Educational psychologist conducted group or individual video education with verbal and written material and guidebook.  Patient learns eating strategies to improve overall health, including an approach to cook more at home. Recommendations include thinking of animal protein as a side on your plate rather than center stage and focusing instead on lower calorie dense options like vegetables, fruits, whole grains, and plant-based proteins, such as beans. Making sauces in large quantities to freeze for later and leaving the skin on  your vegetables are also recommended to maximize your experience.  Cooking - Healthy Salads and Dressing Clinical staff conducted group or individual video education with verbal and written material and guidebook.  Patient learns that vegetables, fruits, whole grains, and legumes are the foundations of the Pritikin Eating Plan. Recommendations include how to incorporate each of these in flavorful and healthy salads, and how to create homemade salad dressings. Proper handling of ingredients is also covered. Cooking - Soups and [employer] - Soups and Desserts Clinical staff conducted group or individual video education with verbal and written material and guidebook.  Patient learns that Pritikin soups and desserts make for easy, nutritious, and delicious snacks and meal components that are low in sodium, fat, sugar, and calorie density, while high in vitamins, minerals, and filling fiber. Recommendations include simple and healthy ideas for soups and desserts.   Overview     The Pritikin Solution Program Overview Clinical staff conducted group or individual video  education with verbal and written material and guidebook.  Patient learns that the results of the Pritikin Program have been documented in more than 100 articles published in peer-reviewed journals, and the benefits include reducing risk factors for (and, in some cases, even reversing) high cholesterol, high blood pressure, type 2 diabetes, obesity, and more! An overview of the three key pillars of the Pritikin Program will be covered: eating well, doing regular exercise, and having a healthy mind-set.  WORKSHOPS  Exercise: Exercise Basics: Building Your Action Plan Clinical staff led group instruction and group discussion with PowerPoint presentation and patient guidebook. To enhance the learning environment the use of posters, models and videos may be added. At the conclusion of this workshop, patients will comprehend the difference between physical activity and exercise, as well as the benefits of incorporating both, into their routine. Patients will understand the FITT (Frequency, Intensity, Time, and Type) principle and how to use it to build an exercise action plan. In addition, safety concerns and other considerations for exercise and cardiac rehab will be addressed by the presenter. The purpose of this lesson is to promote a comprehensive and effective weekly exercise routine in order to improve patients' overall level of fitness.   Managing Heart Disease: Your Path to a Healthier Heart Clinical staff led group instruction and group discussion with PowerPoint presentation and patient guidebook. To enhance the learning environment the use of posters, models and videos may be added.At the conclusion of this workshop, patients will understand the anatomy and physiology of the heart. Additionally, they will understand how Pritikin's three pillars impact the risk factors, the progression, and the management of heart disease.  The purpose of this lesson is to provide a high-level overview of the  heart, heart disease, and how the Pritikin lifestyle positively impacts risk factors.  Exercise Biomechanics Clinical staff led group instruction and group discussion with PowerPoint presentation and patient guidebook. To enhance the learning environment the use of posters, models and videos may be added. Patients will learn how the structural parts of their bodies function and how these functions impact their daily activities, movement, and exercise. Patients will learn how to promote a neutral spine, learn how to manage pain, and identify ways to improve their physical movement in order to promote healthy living. The purpose of this lesson is to expose patients to common physical limitations that impact physical activity. Participants will learn practical ways to adapt and manage aches and pains, and to minimize their effect on regular exercise. Patients will  learn how to maintain good posture while sitting, walking, and lifting.  Balance Training and Fall Prevention  Clinical staff led group instruction and group discussion with PowerPoint presentation and patient guidebook. To enhance the learning environment the use of posters, models and videos may be added. At the conclusion of this workshop, patients will understand the importance of their sensorimotor skills (vision, proprioception, and the vestibular system) in maintaining their ability to balance as they age. Patients will apply a variety of balancing exercises that are appropriate for their current level of function. Patients will understand the common causes for poor balance, possible solutions to these problems, and ways to modify their physical environment in order to minimize their fall risk. The purpose of this lesson is to teach patients about the importance of maintaining balance as they age and ways to minimize their risk of falling.  WORKSHOPS   Nutrition:  Fueling a Ship broker led group instruction and  group discussion with PowerPoint presentation and patient guidebook. To enhance the learning environment the use of posters, models and videos may be added. Patients will review the foundational principles of the Pritikin Eating Plan and understand what constitutes a serving size in each of the food groups. Patients will also learn Pritikin-friendly foods that are better choices when away from home and review make-ahead meal and snack options. Calorie density will be reviewed and applied to three nutrition priorities: weight maintenance, weight loss, and weight gain. The purpose of this lesson is to reinforce (in a group setting) the key concepts around what patients are recommended to eat and how to apply these guidelines when away from home by planning and selecting Pritikin-friendly options. Patients will understand how calorie density may be adjusted for different weight management goals.  Mindful Eating  Clinical staff led group instruction and group discussion with PowerPoint presentation and patient guidebook. To enhance the learning environment the use of posters, models and videos may be added. Patients will briefly review the concepts of the Pritikin Eating Plan and the importance of low-calorie dense foods. The concept of mindful eating will be introduced as well as the importance of paying attention to internal hunger signals. Triggers for non-hunger eating and techniques for dealing with triggers will be explored. The purpose of this lesson is to provide patients with the opportunity to review the basic principles of the Pritikin Eating Plan, discuss the value of eating mindfully and how to measure internal cues of hunger and fullness using the Hunger Scale. Patients will also discuss reasons for non-hunger eating and learn strategies to use for controlling emotional eating.  Targeting Your Nutrition Priorities Clinical staff led group instruction and group discussion with PowerPoint presentation  and patient guidebook. To enhance the learning environment the use of posters, models and videos may be added. Patients will learn how to determine their genetic susceptibility to disease by reviewing their family history. Patients will gain insight into the importance of diet as part of an overall healthy lifestyle in mitigating the impact of genetics and other environmental insults. The purpose of this lesson is to provide patients with the opportunity to assess their personal nutrition priorities by looking at their family history, their own health history and current risk factors. Patients will also be able to discuss ways of prioritizing and modifying the Pritikin Eating Plan for their highest risk areas  Menu  Clinical staff led group instruction and group discussion with PowerPoint presentation and patient guidebook. To enhance the learning environment the use  of posters, models and videos may be added. Using menus brought in from [employer], or printed from [employer], patients will apply the Pritikin dining out guidelines that were presented in the [employer] video. Patients will also be able to practice these guidelines in a variety of provided scenarios. The purpose of this lesson is to provide patients with the opportunity to practice hands-on learning of the Pritikin Dining Out guidelines with actual menus and practice scenarios.  Label Reading Clinical staff led group instruction and group discussion with PowerPoint presentation and patient guidebook. To enhance the learning environment the use of posters, models and videos may be added. Patients will review and discuss the Pritikin label reading guidelines presented in Pritikin's Label Reading Educational series video. Using fool labels brought in from local grocery stores and markets, patients will apply the label reading guidelines and determine if the packaged food meet the Pritikin guidelines. The purpose of  this lesson is to provide patients with the opportunity to review, discuss, and practice hands-on learning of the Pritikin Label Reading guidelines with actual packaged food labels. Cooking School  Pritikin's [employer] are designed to teach patients ways to prepare quick, simple, and affordable recipes at home. The importance of nutrition's role in chronic disease risk reduction is reflected in its emphasis in the overall Pritikin program. By learning how to prepare essential core Pritikin Eating Plan recipes, patients will increase control over what they eat; be able to customize the flavor of foods without the use of added salt, sugar, or fat; and improve the quality of the food they consume. By learning a set of core recipes which are easily assembled, quickly prepared, and affordable, patients are more likely to prepare more healthy foods at home. These workshops focus on convenient breakfasts, simple entres, side dishes, and desserts which can be prepared with minimal effort and are consistent with nutrition recommendations for cardiovascular risk reduction. Cooking [employer] are taught by a Armed forces logistics/support/administrative officer (RD) who has been trained by the [employer]. The chef or RD has a clear understanding of the importance of minimizing - if not completely eliminating - added fat, sugar, and sodium in recipes. Throughout the series of Cooking School Workshop sessions, patients will learn about healthy ingredients and efficient methods of cooking to build confidence in their capability to prepare    Cooking School weekly topics:  Adding Flavor- Sodium-Free  Fast and Healthy Breakfasts  Powerhouse Plant-Based Proteins  Satisfying Salads and Dressings  Simple Sides and Sauces  International Cuisine-Spotlight on the [employer] Zones  Delicious Desserts  Savory Soups  [employer] - Meals in a Astronomer Appetizers and Snacks  Comforting Weekend  Breakfasts  One-Pot Wonders   Fast Evening Meals  Landscape architect Your Pritikin Plate  WORKSHOPS   Healthy Mindset (Psychosocial):  Focused Goals, Sustainable Changes Clinical staff led group instruction and group discussion with PowerPoint presentation and patient guidebook. To enhance the learning environment the use of posters, models and videos may be added. Patients will be able to apply effective goal setting strategies to establish at least one personal goal, and then take consistent, meaningful action toward that goal. They will learn to identify common barriers to achieving personal goals and develop strategies to overcome them. Patients will also gain an understanding of how our mind-set can impact our ability to achieve goals and the importance of cultivating a positive and growth-oriented mind-set. The purpose of this lesson  is to provide patients with a deeper understanding of how to set and achieve personal goals, as well as the tools and strategies needed to overcome common obstacles which may arise along the way.  From Head to Heart: The Power of a Healthy Outlook  Clinical staff led group instruction and group discussion with PowerPoint presentation and patient guidebook. To enhance the learning environment the use of posters, models and videos may be added. Patients will be able to recognize and describe the impact of emotions and mood on physical health. They will discover the importance of self-care and explore self-care practices which may work for them. Patients will also learn how to utilize the 4 C's to cultivate a healthier outlook and better manage stress and challenges. The purpose of this lesson is to demonstrate to patients how a healthy outlook is an essential part of maintaining good health, especially as they continue their cardiac rehab journey.  Healthy Sleep for a Healthy Heart Clinical staff led group instruction and group discussion with  PowerPoint presentation and patient guidebook. To enhance the learning environment the use of posters, models and videos may be added. At the conclusion of this workshop, patients will be able to demonstrate knowledge of the importance of sleep to overall health, well-being, and quality of life. They will understand the symptoms of, and treatments for, common sleep disorders. Patients will also be able to identify daytime and nighttime behaviors which impact sleep, and they will be able to apply these tools to help manage sleep-related challenges. The purpose of this lesson is to provide patients with a general overview of sleep and outline the importance of quality sleep. Patients will learn about a few of the most common sleep disorders. Patients will also be introduced to the concept of "sleep hygiene," and discover ways to self-manage certain sleeping problems through simple daily behavior changes. Finally, the workshop will motivate patients by clarifying the links between quality sleep and their goals of heart-healthy living.   Recognizing and Reducing Stress Clinical staff led group instruction and group discussion with PowerPoint presentation and patient guidebook. To enhance the learning environment the use of posters, models and videos may be added. At the conclusion of this workshop, patients will be able to understand the types of stress reactions, differentiate between acute and chronic stress, and recognize the impact that chronic stress has on their health. They will also be able to apply different coping mechanisms, such as reframing negative self-talk. Patients will have the opportunity to practice a variety of stress management techniques, such as deep abdominal breathing, progressive muscle relaxation, and/or guided imagery.  The purpose of this lesson is to educate patients on the role of stress in their lives and to provide healthy techniques for coping with it.  Learning  Barriers/Preferences:  Learning Barriers/Preferences - 03/06/23 0934       Learning Barriers/Preferences   Learning Barriers Sight   wears glasses   Learning Preferences Audio;Computer/Internet;Group Instruction;Individual Instruction;Pictoral;Skilled Demonstration;Verbal Instruction;Video;Written Material             Education Topics:  Knowledge Questionnaire Score:  Knowledge Questionnaire Score - 03/06/23 1133       Knowledge Questionnaire Score   Pre Score 22/24             Core Components/Risk Factors/Patient Goals at Admission:  Personal Goals and Risk Factors at Admission - 03/06/23 1133       Core Components/Risk Factors/Patient Goals on Admission   Improve shortness of breath with ADL's Yes  Intervention Provide education, individualized exercise plan and daily activity instruction to help decrease symptoms of SOB with activities of daily living.    Expected Outcomes Short Term: Improve cardiorespiratory fitness to achieve a reduction of symptoms when performing ADLs;Long Term: Be able to perform more ADLs without symptoms or delay the onset of symptoms    Diabetes Yes    Intervention Provide education about proper nutrition, including hydration, and aerobic/resistive exercise prescription along with prescribed medications to achieve blood glucose in normal ranges: Fasting glucose 65-99 mg/dL;Provide education about signs/symptoms and action to take for hypo/hyperglycemia.    Expected Outcomes Long Term: Attainment of HbA1C < 7%.;Short Term: Participant verbalizes understanding of the signs/symptoms and immediate care of hyper/hypoglycemia, proper foot care and importance of medication, aerobic/resistive exercise and nutrition plan for blood glucose control.    Hypertension Yes    Intervention Provide education on lifestyle modifcations including regular physical activity/exercise, weight management, moderate sodium restriction and increased consumption of fresh  fruit, vegetables, and low fat dairy, alcohol moderation, and smoking cessation.;Monitor prescription use compliance.    Expected Outcomes Short Term: Continued assessment and intervention until BP is < 140/67mm HG in hypertensive participants. < 130/56mm HG in hypertensive participants with diabetes, heart failure or chronic kidney disease.;Long Term: Maintenance of blood pressure at goal levels.    Lipids Yes    Intervention Provide education and support for participant on nutrition & aerobic/resistive exercise along with prescribed medications to achieve LDL 70mg , HDL >40mg .    Expected Outcomes Short Term: Participant states understanding of desired cholesterol values and is compliant with medications prescribed. Participant is following exercise prescription and nutrition guidelines.;Long Term: Cholesterol controlled with medications as prescribed, with individualized exercise RX and with personalized nutrition plan. Value goals: LDL < 70mg , HDL > 40 mg.    Stress Yes    Intervention Offer individual and/or small group education and counseling on adjustment to heart disease, stress management and health-related lifestyle change. Teach and support self-help strategies.    Expected Outcomes Short Term: Participant demonstrates changes in health-related behavior, relaxation and other stress management skills, ability to obtain effective social support, and compliance with psychotropic medications if prescribed.;Long Term: Emotional wellbeing is indicated by absence of clinically significant psychosocial distress or social isolation.             Core Components/Risk Factors/Patient Goals Review:   Goals and Risk Factor Review     Row Name 03/18/23 2956 03/20/23 0932           Core Components/Risk Factors/Patient Goals Review   Personal Goals Review Weight Management/Obesity;Lipids;Hypertension;Improve shortness of breath with ADL's;Diabetes Weight  Management/Obesity;Lipids;Hypertension;Improve shortness of breath with ADL's;Diabetes      Review Jason Stevenson started cardiac rehab on 03/17/23. Jason Stevenson did well with exercise. Vital signs and CBG's were stable. Jason Stevenson was reminded to eat and hydrate properly before coming to exercise at cardiac rehab. Jason Stevenson started cardiac rehab on 03/17/23. Jason Stevenson is off to a good start to  exercise. Vital signs and CBG's have been stable this week. Jason Stevenson has gained 2.3 kg since starting cardiac rehab      Expected Outcomes Jason Stevenson will continue to participate in cardiac rehab for exercise, nutrition and lifestyle modifications. Cluster will continue to participate in cardiac rehab for exercise, nutrition and lifestyle modifications.               Core Components/Risk Factors/Patient Goals at Discharge (Final Review):   Goals and Risk Factor Review - 03/20/23 0932  Core Components/Risk Factors/Patient Goals Review   Personal Goals Review Weight Management/Obesity;Lipids;Hypertension;Improve shortness of breath with ADL's;Diabetes    Review Adan started cardiac rehab on 03/17/23. Lionell is off to a good start to  exercise. Vital signs and CBG's have been stable this week. Jakevious has gained 2.3 kg since starting cardiac rehab    Expected Outcomes Sevrin will continue to participate in cardiac rehab for exercise, nutrition and lifestyle modifications.             ITP Comments:  ITP Comments     Row Name 03/06/23 [phone] 03/18/23 0824 03/20/23 0928       ITP Comments Dr. Armanda Magic medical director. Introduction to pritikin education/intensive cardiac rehab. Initial orientation packet reviewed with patient. 30 Day ITP Review. Random strarted cardiac rehab on 03/17/23 and did well with exercise. 30 Day ITP Review. Rahkeem strarted cardiac rehab on 03/17/23. Nakota is off to a good start to exercise.              Comments: See ITP comments.

## 2023-03-26 ENCOUNTER — Encounter (HOSPITAL_COMMUNITY): Payer: Medicaid Other

## 2023-03-28 ENCOUNTER — Encounter (HOSPITAL_COMMUNITY): Admission: RE | Admit: 2023-03-28 | Payer: Medicaid Other | Source: Ambulatory Visit

## 2023-03-31 ENCOUNTER — Other Ambulatory Visit: Payer: Self-pay | Admitting: Licensed Clinical Social Worker

## 2023-03-31 ENCOUNTER — Encounter (HOSPITAL_COMMUNITY)
Admission: RE | Admit: 2023-03-31 | Discharge: 2023-03-31 | Disposition: A | Discharge status: Home / Self Care | Payer: Medicaid Other | Source: Ambulatory Visit | Attending: Cardiology

## 2023-03-31 DIAGNOSIS — Z941 Heart transplant status: Secondary | ICD-10-CM | POA: Diagnosis not present

## 2023-03-31 NOTE — Patient Instructions (Signed)
Visit Information  Mr. Matusik was given information about Medicaid Managed Care team care coordination services as a part of their Healthy [facility] Medicaid benefit. Birt Biedrzycki wishes to consider to engagement with the [facility] Managed Care team.   If you are experiencing a medical emergency, please call 911 or report to your local emergency department or urgent care.   If you have a non-emergency medical problem during routine business hours, please contact your provider's office and ask to speak with a nurse.   For questions related to your Healthy [facility] health plan, please call: [phone] or visit the homepage here: [email or web address]  If you would like to schedule transportation through your Healthy [facility] plan, please call the following number at least 2 days in advance of your appointment: [phone]  For information about your ride after you set it up, call Ride Assist at [phone]. Use this number to activate a Will Call pickup, or if your transportation is late for a scheduled pickup. Use this number, too, if you need to make a change or cancel a previously scheduled reservation.  If you need transportation services right away, call [phone]. The after-hours call center is staffed 24 hours to handle ride assistance and urgent reservation requests (including discharges) 365 days a year. Urgent trips include sick visits, hospital discharge requests and life-sustaining treatment.  Call the [facility] Line at [phone], at any time, 24 hours a day, 7 days a week. If you are in danger or need immediate medical attention call 911.  If you would like help to quit smoking, call 1-800-QUIT-NOW (973 862 1014) OR Espaol: 1-855-Djelo-Ya (6-948-546-2703) o para ms informacin haga clic aqu or Text READY to 500-938 to register via text RESOURCES TO CONSIDER   The financial assistance we are able to provide  depends on the amount of funds we have for that month.- [employer]   [employer], call our rental hotline, [phone] ext. 340 for updated information.  (As soon as the system answers, you may enter the extension 340 and bypass the prompts.)  Utility Assistance, call our utility hotline, [phone] ext. [phone] for updated information.  (As soon as the system answers, you may enter the extension 314 and bypass the prompts.)   *PLEASE NOTE:  These hotlines are for informational purposes only.  You will not be able to leave a message at either extension.  Energy Assistance - Help for Heating and Cooling (336) [phone]  Extreme heat or cold during the winter and summer months can cause energy costs to increase and result in high energy bills that can impact a family's budget. There are some of the [facility] programs that may be able to offer help for these expenses.  Crisis Intervention Program (CIP)  Heating:  Generally available in October through April (depending on funding and temperatures). Helps with primary heating sources like electric, natural gas, LP gas, oil, kerosene and wood. This program helps with past due accounts, disconnected accounts or for establishing new services. To be eligible - must meet income requirements and be experiencing or in danger of experiencing a heating-related emergency (life-threatening or health related emergency). Residents can apply online through the state at [email or web address]  Cooling:  Generally available May through September (depending on funding and temperatures). This program helps with past due accounts, disconnected accounts or for establishing new services. Eligible applicants must meet income requirements and be experiencing or in danger of experiencing a cooling-related emergency (life-threatening or health related emergency). Residents can apply online through  the state at [email or web address]         Low Income  Energy Assistance Program [facility])  Available December - March, or until funds are exhausted. Early application period begins in December for individuals aged 75 or older or a person with a disability and receiving services from [facility]. Regular application period January - March is open to anyone meeting the income guidelines. Helps with primary heating sources like electric, natural gas, LP gas, kerosene and wood Does not require a past due bill and can help create a credit balance. To be eligible - must meet income guidelines. Residents can apply online through the state at [email or web address]  [facility] LCSW will sign off at this time as resources have been successfully provided and patients prefers to contact [facility] LCSW directly if he has any future concerns or needs rather than having a follow up call.   Dickie La, BSW, MSW, LCSW Licensed Clinical Social Worker [employer]   [email or web address] Direct Dial: [phone]

## 2023-03-31 NOTE — Patient Outreach (Signed)
Medicaid Managed Care Social Work Note  03/31/2023 Name:  Jason Stevenson MRN:  [id number] DOB:  [birth year]  Jason Stevenson is an 37 y.o. year old male who is a primary patient of Jason Matar, MD.  The Medicaid Managed Care Coordination team was consulted for assistance with:  Community Resources   [name] was given information about Medicaid Managed Care Coordination team services today. Jason Stevenson Patient agreed to resources being mailed but denied needing a follow up call.   Engaged with patient  for by telephone for initial visit in response to referral for case management and/or care coordination services.   Patient is participating in a Managed Medicaid Plan:  Yes  Assessments/Interventions:  Review of past medical history, allergies, medications, health status, including review of consultants reports, laboratory and other test data, was performed as part of comprehensive evaluation and provision of chronic care management services.  SDOH: (Social Drivers of Health) assessments and interventions performed: SDOH Interventions    Flowsheet Row Patient Outreach Telephone from 03/31/2023 in [facility] POPULATION HEALTH DEPARTMENT Office Visit from 03/20/2023 in [facility] [place] [facility] [place] Telephone from 09/20/2022 in [facility] Telephone from 01/28/2022 in [facility] Telephone from 12/18/2021 in [facility] CARDIAC REHAB PHASE II ORIENTATION from 11/13/2021 in [facility] for Heart, Vascular, & Lung Health  SDOH Interventions        Food Insecurity Interventions -- Intervention Not Indicated -- -- -- --  Housing Interventions Community Resources Provided Intervention Not Indicated -- Other (Comment)  [provided housing resources] -- --  Jason Stevenson Provided, Payor Benefit Intervention Not Indicated -- -- -- --  Utilities Interventions [employer] Resources Provided Intervention Not Indicated Other (Comment)  [patient care fund] -- Other (Comment)  [Patient Care fund] --  Alcohol Usage Interventions -- Intervention Not Indicated (Score <7) -- -- -- --  Depression Interventions/Treatment  Currently on Treatment  [Pt reports actively seeing a psychiatrist. He says his pastor is his therapist. [facility] LCSW offered BH treatment resource connection but pt declined needing this] -- -- -- -- Currently on Treatment, Counseling  Financial Strain Interventions -- Intervention Not Indicated -- -- -- --  Physical Activity Interventions -- Intervention Not Indicated -- -- -- --  Stress Interventions Community Resources Provided Intervention Not Indicated -- -- -- --  Social Connections Interventions -- Intervention Not Indicated -- -- -- --  Health Literacy Interventions -- Intervention Not Indicated -- -- -- --       Advanced Directives Status:  Not addressed in this encounter.  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   Poorly controlled type 2 diabetes mellitus with circulatory disorder (HCC) 03/19/2021   Ischemic cardiomyopathy    Syncope 12/14/2020   Chest pain 10/30/2020   History of cerebrovascular accident (CVA) with residual deficit 10/27/2020   Acute CVA (cerebrovascular accident) (HCC) 09/21/2020   Near syncope 09/21/2020   LV (left ventricular)  mural thrombus 09/13/2020   Seizure (HCC) 09/13/2020   Neurological deficit present 09/12/2020   History of COVID-19 09/12/2020    Intracerebral hemorrhage 09/12/2020   Acute cerebrovascular accident (CVA) (HCC) 09/12/2020   COVID-19 08/23/2020   Noncompliance with medication treatment due to intermittent use of medication 10/12/2018   Dyslipidemia 04/17/2016   Chronic systolic CHF (congestive heart failure) (HCC) 06/18/2015   Needs flu shot 12/23/2013   Diabetes mellitus (HCC) 06/17/2013   Non-ischemic cardiomyopathy (HCC) 11/06/2012   HTN (hypertension) 11/06/2012   HLD (hyperlipidemia) 11/06/2012   Patient reports needing utility, rental and food assistance resources. [facility] LCSW emailed patient two complete resource guides for food and financial support for [facility]. Patient is on food stamps and is aware of a few local pantries. He denies needing any behavioral health resources. He denies any further social work needs. He denies being in any current crises and does have housing but is struggling to keep up with rent and utility payments. Resources sent to patient include:  RESOURCES TO CONSIDER   The financial assistance we are able to provide depends on the amount of funds we have for that month.- [employer]   [employer], call our rental hotline, 726-729-2103 ext. 340 for updated information.  (As soon as the system answers, you may enter the extension 340 and bypass the prompts.)  Utility Assistance, call our utility hotline, 343-426-6623 ext. 314 for updated information.  (As soon as the system answers, you may enter the extension 314 and bypass the prompts.)   *PLEASE NOTE:  These hotlines are for informational purposes only.  You will not be able to leave a message at either extension.  Energy Assistance - Help for Heating and Cooling [phone] [phone]  Extreme heat or cold during the winter and summer months can cause energy costs to increase and result in high energy bills that can impact a family's budget. There are some of the [facility] programs that may be able to offer help for these  expenses.  Crisis Intervention Program (CIP)  Heating:  Generally available in October through April (depending on funding and temperatures). Helps with primary heating sources like electric, natural gas, LP gas, oil, kerosene and wood. This program helps with past due accounts, disconnected accounts or for establishing new services. To be eligible - must meet income requirements and be experiencing or in danger of experiencing a heating-related emergency (life-threatening or health related emergency). Residents can apply online through the state at [email or web address].[email or web address]  Cooling:  Generally available May through September (depending on funding and temperatures). This program helps with past due accounts, disconnected accounts or for establishing new services. Eligible applicants must meet income requirements and be experiencing or in danger of experiencing a cooling-related emergency (life-threatening or health related emergency). Residents can apply online through the state at https://[email or web address].[email or web address]         Low Income Energy Assistance Program [facility])  Available December - March, or until funds are exhausted. Early application period begins in December for individuals aged 53 or older or a person with a disability and receiving services from [facility]. Regular application period January - March is open to anyone meeting the income guidelines. Helps with primary heating sources like electric, natural gas, LP gas, kerosene and wood Does not require a past due bill and can help create a credit balance. To be eligible - must meet income guidelines. Residents can apply online through the state at [email or web address].[email or web address]  [facility] LCSW  will sign off at this time as resources have been successfully provided and patients prefers to contact [facility] LCSW directly if he has any future concerns or needs rather than having a follow up call.   Follow up:  Patient requests no  follow-up at this time.  Plan: The Managed Medicaid care management team is available to follow up with the patient after provider conversation with the patient regarding recommendation for care management engagement and subsequent re-referral to the care management team.   Dickie La, BSW, MSW, LCSW Licensed Clinical Social Worker [place]   [facility] [email or web address] Direct Dial: [phone]

## 2023-04-02 ENCOUNTER — Encounter (HOSPITAL_COMMUNITY)
Admission: RE | Admit: 2023-04-02 | Discharge: 2023-04-02 | Disposition: A | Discharge status: Home / Self Care | Payer: Medicaid Other | Source: Ambulatory Visit | Attending: Cardiology

## 2023-04-02 DIAGNOSIS — Z941 Heart transplant status: Secondary | ICD-10-CM

## 2023-04-02 LAB — GLUCOSE, CAPILLARY
Glucose-Capillary: 344 mg/dL — ABNORMAL HIGH (ref 70–99)
Glucose-Capillary: 351 mg/dL — ABNORMAL HIGH (ref 70–99)
Glucose-Capillary: 253 mg/dL — ABNORMAL HIGH (ref 70–99)

## 2023-04-02 NOTE — Progress Notes (Signed)
Incomplete Session Note  Patient Details  Name: Jason Stevenson MRN: [id number] Date of Birth: 08/15/1985 Referring Provider:   Flowsheet Row CARDIAC REHAB PHASE II ORIENTATION from 03/06/2023 in [facility] for Heart, Vascular, & Lung Health  Referring Provider Dr. Edwena Blow [name], MD covering)       [name] did not complete his rehab session.  Second reading  post taking insulin drinking 16 ounces of water and breakfast bar on departmental meter read 351.  His meter read 270.  Discrepancy between the 2 meters. Question the validity of the readings.  Recently changed sites for the sensor. Dimitrious ate a snack of wheat thins and chips during the middle of the night based upon 117 with downward arrow on his meter. Talked about appropriate snacks to have which contain protein and only 30 grams of carbohydrate. Example of his snack that probably wasn't indicated was all carbohydrates with negligible amount of protein.  Explained reasoning behind the need for protein and fiber to be a part of any meal as well as snacks. Talked to him about proper rest and the liver involvement when he stays up really late Tieler was given peanut butter, graham crackers in case of a delay with shared transportation and his arrival to his home.  Plan on getting a new meter as he has had this one for a long period of time. Takahiro has stated this before but does not follow through. Stressed the importance of optimal diabetes management post heart transplant.  Saam stated that he will do better. Will route this note to his PCP Dr.  [name]. Alanson Aly, BSN Cardiac and Emergency planning/management officer

## 2023-04-02 NOTE — Progress Notes (Signed)
Jason Stevenson arrived at 7:15 for his 8:15 exercise class for Cardiac Rehab. Transportation services picked him up earlier than usual.  Pt had not had an opportunity to eat or take his insulin.  Asked what his blood sugar was this morning.  He reports his meter read 324.  Reports no symptoms. Maize has his insulin with him.  Advised to take his morning dose of insulin for a CBG reading of 324.  Pt given 16 ounces of water. Checked his CBG on our meter 344. Advised that he cbg must be below 300. 30 minutes checked in with Earvin Hansen, his meter reads 270.  Breakfast bar given.  Will recheck CBG on our meter in 15 minutes. Based upon this reading to determine if pt will be able to stay for exercise or call transportation to be picked up. Alanson Aly, BSN Cardiac and Emergency planning/management officer

## 2023-04-04 ENCOUNTER — Encounter (HOSPITAL_COMMUNITY)
Admission: RE | Admit: 2023-04-04 | Discharge: 2023-04-04 | Disposition: A | Discharge status: Home / Self Care | Payer: Medicaid Other | Source: Ambulatory Visit | Attending: Cardiology | Admitting: Cardiology

## 2023-04-04 DIAGNOSIS — Z941 Heart transplant status: Secondary | ICD-10-CM

## 2023-04-04 LAB — GLUCOSE, CAPILLARY: Glucose-Capillary: 235 mg/dL — ABNORMAL HIGH (ref 70–99)

## 2023-04-07 ENCOUNTER — Encounter (HOSPITAL_COMMUNITY): Admission: RE | Admit: 2023-04-07 | Payer: Medicaid Other | Source: Ambulatory Visit

## 2023-04-10 DIAGNOSIS — D84821 Immunodeficiency due to drugs: Secondary | ICD-10-CM | POA: Diagnosis not present

## 2023-04-10 DIAGNOSIS — Z2989 Encounter for other specified prophylactic measures: Secondary | ICD-10-CM | POA: Diagnosis not present

## 2023-04-10 DIAGNOSIS — N529 Male erectile dysfunction, unspecified: Secondary | ICD-10-CM | POA: Diagnosis not present

## 2023-04-10 DIAGNOSIS — E1122 Type 2 diabetes mellitus with diabetic chronic kidney disease: Secondary | ICD-10-CM | POA: Diagnosis not present

## 2023-04-10 DIAGNOSIS — Z79621 Long term (current) use of calcineurin inhibitor: Secondary | ICD-10-CM | POA: Diagnosis not present

## 2023-04-10 DIAGNOSIS — Z794 Long term (current) use of insulin: Secondary | ICD-10-CM | POA: Diagnosis not present

## 2023-04-10 DIAGNOSIS — Z48298 Encounter for aftercare following other organ transplant: Secondary | ICD-10-CM | POA: Diagnosis not present

## 2023-04-10 DIAGNOSIS — R569 Unspecified convulsions: Secondary | ICD-10-CM | POA: Diagnosis not present

## 2023-04-10 DIAGNOSIS — Z7982 Long term (current) use of aspirin: Secondary | ICD-10-CM | POA: Diagnosis not present

## 2023-04-10 DIAGNOSIS — Z79899 Other long term (current) drug therapy: Secondary | ICD-10-CM | POA: Diagnosis not present

## 2023-04-10 DIAGNOSIS — T8621 Heart transplant rejection: Secondary | ICD-10-CM | POA: Diagnosis not present

## 2023-04-10 DIAGNOSIS — E782 Mixed hyperlipidemia: Secondary | ICD-10-CM | POA: Diagnosis not present

## 2023-04-10 DIAGNOSIS — D849 Immunodeficiency, unspecified: Secondary | ICD-10-CM | POA: Diagnosis not present

## 2023-04-10 DIAGNOSIS — I129 Hypertensive chronic kidney disease with stage 1 through stage 4 chronic kidney disease, or unspecified chronic kidney disease: Secondary | ICD-10-CM | POA: Diagnosis not present

## 2023-04-10 DIAGNOSIS — Z79624 Long term (current) use of inhibitors of nucleotide synthesis: Secondary | ICD-10-CM | POA: Diagnosis not present

## 2023-04-10 DIAGNOSIS — R251 Tremor, unspecified: Secondary | ICD-10-CM | POA: Diagnosis not present

## 2023-04-10 DIAGNOSIS — N189 Chronic kidney disease, unspecified: Secondary | ICD-10-CM | POA: Diagnosis not present

## 2023-04-10 DIAGNOSIS — Z941 Heart transplant status: Secondary | ICD-10-CM | POA: Diagnosis not present

## 2023-04-11 ENCOUNTER — Encounter (HOSPITAL_COMMUNITY): Payer: Medicaid Other

## 2023-04-14 ENCOUNTER — Encounter (HOSPITAL_COMMUNITY): Payer: Medicaid Other

## 2023-04-16 ENCOUNTER — Other Ambulatory Visit: Payer: Self-pay | Admitting: Internal Medicine

## 2023-04-16 DIAGNOSIS — E139 Other specified diabetes mellitus without complications: Secondary | ICD-10-CM

## 2023-04-16 MED ORDER — FREESTYLE LIBRE 2 SENSOR MISC: 1 refills | Status: AC

## 2023-04-17 ENCOUNTER — Telehealth (HOSPITAL_COMMUNITY): Payer: Self-pay | Admitting: *Deleted

## 2023-04-17 NOTE — Telephone Encounter (Signed)
-----   Message from Barnie Louder sent at 04/16/2023  3:37 PM EST ----- Pt has a continuous glucose monitor the [employer]. Updated prescription sent. ----- Message ----- From: Bernett Saturnino NOVAK, RN Sent: 04/02/2023   9:25 AM EST To: Barnie NOVAK Louder, MD  Dr. Louder,  See note below.  Believe Jason Stevenson meter readings are inaccurate. He is treating himself based upon what his meter reads therefore when he comes to Cardiac Rehab he is too high for exercise. He states that he has had this meter a long time.  Is it possible for him to get a script for a new Continuous Glucose Meter? Thank you  Saturnino Bernett RN, BSN Cardiac and Pulmonary Rehab Nurse Navigator

## 2023-04-18 ENCOUNTER — Telehealth (HOSPITAL_COMMUNITY): Payer: Self-pay | Admitting: *Deleted

## 2023-04-18 ENCOUNTER — Encounter (HOSPITAL_COMMUNITY): Payer: Medicaid Other

## 2023-04-18 NOTE — Telephone Encounter (Signed)
 Left message to call cardiac rehab.Thayer Headings RN BSN

## 2023-04-18 NOTE — Telephone Encounter (Signed)
 Jason Stevenson says he did not set up a ride to attend cardiac rehab 48 hours in advance. Jason Stevenson he has been at home celebrating the holidays at home with his wife and has been to some doctor's appointments. Jason Stevenson plans to return to exercise either on Monday or Wednesday of next week.Hadassah Elpidio Quan RN BSN

## 2023-04-21 ENCOUNTER — Encounter (HOSPITAL_COMMUNITY): Payer: Medicaid Other

## 2023-04-21 ENCOUNTER — Telehealth (HOSPITAL_COMMUNITY): Payer: Self-pay | Admitting: *Deleted

## 2023-04-21 NOTE — Telephone Encounter (Signed)
 Received voice mail message from [employer]. Will not be at cardiac rehab today.

## 2023-04-23 ENCOUNTER — Encounter (HOSPITAL_COMMUNITY)
Admission: RE | Admit: 2023-04-23 | Discharge: 2023-04-23 | Disposition: A | Discharge status: Home / Self Care | Payer: Medicaid Other | Source: Ambulatory Visit | Attending: Cardiology | Admitting: Cardiology

## 2023-04-23 DIAGNOSIS — Z4821 Encounter for aftercare following heart transplant: Secondary | ICD-10-CM | POA: Insufficient documentation

## 2023-04-23 DIAGNOSIS — E162 Hypoglycemia, unspecified: Secondary | ICD-10-CM | POA: Diagnosis not present

## 2023-04-23 DIAGNOSIS — Z941 Heart transplant status: Secondary | ICD-10-CM | POA: Insufficient documentation

## 2023-04-24 DIAGNOSIS — R569 Unspecified convulsions: Secondary | ICD-10-CM | POA: Diagnosis not present

## 2023-04-24 DIAGNOSIS — G40909 Epilepsy, unspecified, not intractable, without status epilepticus: Secondary | ICD-10-CM | POA: Diagnosis not present

## 2023-04-24 DIAGNOSIS — R6889 Other general symptoms and signs: Secondary | ICD-10-CM | POA: Diagnosis not present

## 2023-04-25 ENCOUNTER — Encounter (HOSPITAL_COMMUNITY): Payer: Medicaid Other

## 2023-04-28 ENCOUNTER — Encounter (HOSPITAL_COMMUNITY): Payer: Medicaid Other

## 2023-04-30 ENCOUNTER — Encounter (HOSPITAL_COMMUNITY)
Admission: RE | Admit: 2023-04-30 | Discharge: 2023-04-30 | Disposition: A | Discharge status: Home / Self Care | Payer: Medicaid Other | Source: Ambulatory Visit | Attending: Cardiology

## 2023-04-30 DIAGNOSIS — Z941 Heart transplant status: Secondary | ICD-10-CM

## 2023-04-30 DIAGNOSIS — Z4821 Encounter for aftercare following heart transplant: Secondary | ICD-10-CM | POA: Diagnosis not present

## 2023-04-30 DIAGNOSIS — E162 Hypoglycemia, unspecified: Secondary | ICD-10-CM | POA: Diagnosis not present

## 2023-05-02 ENCOUNTER — Encounter (HOSPITAL_COMMUNITY)
Admission: RE | Admit: 2023-05-02 | Discharge: 2023-05-02 | Disposition: A | Discharge status: Home / Self Care | Payer: Medicaid Other | Source: Ambulatory Visit | Attending: Cardiology | Admitting: Cardiology

## 2023-05-02 DIAGNOSIS — E162 Hypoglycemia, unspecified: Secondary | ICD-10-CM

## 2023-05-02 DIAGNOSIS — Z941 Heart transplant status: Secondary | ICD-10-CM

## 2023-05-02 LAB — GLUCOSE, CAPILLARY
Glucose-Capillary: 66 mg/dL — ABNORMAL LOW (ref 70–99)
Glucose-Capillary: 99 mg/dL (ref 70–99)

## 2023-05-02 NOTE — Progress Notes (Signed)
Incomplete Session Note  Patient Details  Name: Jason Stevenson MRN: [id number] Date of Birth: 1985/10/23 Referring Provider:   Flowsheet Row CARDIAC REHAB PHASE II ORIENTATION from 03/06/2023 in [facility] for Heart, Vascular, & Lung Health  Referring Provider Dr. Edwena Blow [name], MD covering)       [name] did not complete his rehab session.  Roshard says that his CGM was going off in education class. CBG 73. I checked Earvin Hansen with hospital meter. CBG 66. Patients hands were shaking. Janard said that he gave himself a total of 8 glucose tablets. Patient given a tube of glucose gel per hypoglycemic protocol. Recheck CBG 99. Patient said he ate peanut butter cracker and a little debbie cake. Before he got an uber here this morning. Patient was advised to eat a protein and a carbohydrate before coming to class. Garth says he rushes and does not have time to make breakfast. Patient given  instant oatmeal and unsweetened applesauce to take home with him. Will notify onsite provider Bernadene Person NP and Dr [name]. Will also notify Dr Tye Maryland at [facility] per patient's request. No exercise today per protocol.  Patient was given a banana and some water. Iskander felt better after glucose gel and banana was given.   Hayes had no symptoms or complaints upon exit from cardiac rehab.Will continue to monitor the patient throughout  the program. Thayer Headings RN BSN

## 2023-05-03 ENCOUNTER — Encounter: Payer: Self-pay | Admitting: Internal Medicine

## 2023-05-05 ENCOUNTER — Encounter (HOSPITAL_COMMUNITY): Admission: RE | Admit: 2023-05-05 | Payer: Medicaid Other | Source: Ambulatory Visit

## 2023-05-07 ENCOUNTER — Encounter (HOSPITAL_COMMUNITY): Payer: Medicaid Other

## 2023-05-08 DIAGNOSIS — Z7952 Long term (current) use of systemic steroids: Secondary | ICD-10-CM | POA: Diagnosis not present

## 2023-05-08 DIAGNOSIS — R251 Tremor, unspecified: Secondary | ICD-10-CM | POA: Diagnosis not present

## 2023-05-08 DIAGNOSIS — Z789 Other specified health status: Secondary | ICD-10-CM | POA: Diagnosis not present

## 2023-05-08 DIAGNOSIS — Z2989 Encounter for other specified prophylactic measures: Secondary | ICD-10-CM | POA: Diagnosis not present

## 2023-05-08 DIAGNOSIS — Z941 Heart transplant status: Secondary | ICD-10-CM | POA: Diagnosis not present

## 2023-05-08 DIAGNOSIS — Z79624 Long term (current) use of inhibitors of nucleotide synthesis: Secondary | ICD-10-CM | POA: Diagnosis not present

## 2023-05-08 DIAGNOSIS — Z4821 Encounter for aftercare following heart transplant: Secondary | ICD-10-CM | POA: Diagnosis not present

## 2023-05-08 DIAGNOSIS — Z79621 Long term (current) use of calcineurin inhibitor: Secondary | ICD-10-CM | POA: Diagnosis not present

## 2023-05-08 DIAGNOSIS — Z794 Long term (current) use of insulin: Secondary | ICD-10-CM | POA: Diagnosis not present

## 2023-05-08 DIAGNOSIS — N529 Male erectile dysfunction, unspecified: Secondary | ICD-10-CM | POA: Diagnosis not present

## 2023-05-08 DIAGNOSIS — N189 Chronic kidney disease, unspecified: Secondary | ICD-10-CM | POA: Diagnosis not present

## 2023-05-08 DIAGNOSIS — D849 Immunodeficiency, unspecified: Secondary | ICD-10-CM | POA: Diagnosis not present

## 2023-05-08 DIAGNOSIS — E1122 Type 2 diabetes mellitus with diabetic chronic kidney disease: Secondary | ICD-10-CM | POA: Diagnosis not present

## 2023-05-08 DIAGNOSIS — Z8673 Personal history of transient ischemic attack (TIA), and cerebral infarction without residual deficits: Secondary | ICD-10-CM | POA: Diagnosis not present

## 2023-05-08 DIAGNOSIS — Z79899 Other long term (current) drug therapy: Secondary | ICD-10-CM | POA: Diagnosis not present

## 2023-05-08 DIAGNOSIS — Z7982 Long term (current) use of aspirin: Secondary | ICD-10-CM | POA: Diagnosis not present

## 2023-05-08 DIAGNOSIS — Z48298 Encounter for aftercare following other organ transplant: Secondary | ICD-10-CM | POA: Diagnosis not present

## 2023-05-08 DIAGNOSIS — Z8669 Personal history of other diseases of the nervous system and sense organs: Secondary | ICD-10-CM | POA: Diagnosis not present

## 2023-05-08 DIAGNOSIS — I129 Hypertensive chronic kidney disease with stage 1 through stage 4 chronic kidney disease, or unspecified chronic kidney disease: Secondary | ICD-10-CM | POA: Diagnosis not present

## 2023-05-09 ENCOUNTER — Encounter (HOSPITAL_COMMUNITY)
Admission: RE | Admit: 2023-05-09 | Discharge: 2023-05-09 | Disposition: A | Discharge status: Home / Self Care | Payer: Medicaid Other | Source: Ambulatory Visit | Attending: Cardiology

## 2023-05-12 ENCOUNTER — Encounter (HOSPITAL_COMMUNITY): Admission: RE | Admit: 2023-05-12 | Payer: Medicaid Other | Source: Ambulatory Visit

## 2023-05-14 ENCOUNTER — Telehealth (HOSPITAL_COMMUNITY): Payer: Self-pay | Admitting: *Deleted

## 2023-05-14 ENCOUNTER — Encounter (HOSPITAL_COMMUNITY): Admission: RE | Admit: 2023-05-14 | Payer: Medicaid Other | Source: Ambulatory Visit

## 2023-05-14 NOTE — Telephone Encounter (Signed)
Left message to call cardiac rehab.Thayer Headings RN BSN

## 2023-05-15 NOTE — Progress Notes (Signed)
 Cardiac Individual Treatment Plan  Patient Details  Name: Jason Stevenson MRN: [id number] Date of Birth: November 11, 1985 Referring Provider:   Flowsheet Row CARDIAC REHAB PHASE II ORIENTATION from 03/06/2023 in [facility]  Referring Provider Dr. Edwena Blow ([name], MD covering)       Initial Encounter Date:  Flowsheet Row CARDIAC REHAB PHASE II ORIENTATION from 03/06/2023 in [facility]  Date 03/06/23       Visit Diagnosis: 11/26/22 S/P Orthotropic Heart Transplant  Patient's Home Medications on Admission:  Current Outpatient Medications:    ACCU-CHEK GUIDE test strip, USE TO CHECK BLOOD SUGAR THREE TIMES DAILY., Disp: 100 each, Rfl: 2   acetaminophen (TYLENOL) 325 MG tablet, Take 1-2 tablets (325-650 mg total) by mouth every 4 (four) hours as needed for mild pain., Disp: , Rfl:    amLODipine (NORVASC) 5 MG tablet, Take 5 mg by mouth daily., Disp: , Rfl:    aspirin EC 81 MG tablet, Take 81 mg by mouth daily. Swallow whole., Disp: , Rfl:    Blood Glucose Monitoring Suppl (ACCU-CHEK GUIDE ME) w/Device KIT, USE TO CHECK BLOOD SUGAR THREE TIMES DAILY., Disp: 1 kit, Rfl: 0   Blood Pressure Monitor DEVI, Use as directed to check home blood pressure 2-3 times a week, Disp: 1 Device, Rfl: 0   Calcium Carbonate-Vitamin D (CALTRATE 600+D PO), Take 600 mg by mouth in the morning and at bedtime., Disp: , Rfl:    Continuous Glucose Sensor (FREESTYLE LIBRE 2 SENSOR) MISC, UAD, Disp: 6 each, Rfl: 1   DULoxetine (CYMBALTA) 60 MG capsule, Take 60 mg by mouth daily., Disp: , Rfl:    EASY COMFORT PEN NEEDLES 32G X 4 MM MISC, USE TO INJECT INSULIN FOUR TIMES A DAY, Disp: 200 each, Rfl: 2   furosemide (LASIX) 20 MG tablet, TAKE 1 TABLET (20 MG TOTAL) BY MOUTH 3 (THREE) TIMES A WEEK. MONDAYS WEDNESDAYS AND FRIDAY (Patient taking differently: Take 20 mg by mouth daily as needed.), Disp: 30  tablet, Rfl: 11   GLUCAGON NA, Place 3 sprays into the nose once as needed., Disp: , Rfl:    insulin glargine (LANTUS) 100 UNIT/ML injection, Inject 24 Units into the skin daily., Disp: , Rfl:    insulin lispro (HUMALOG KWIKPEN) 100 UNIT/ML KwikPen, Inject 12 Units into the skin., Disp: , Rfl:    Insulin Syringe-Needle U-100 (RELION INSULIN SYRINGE) 31G X 15/64" 0.3 ML MISC, Use to inject insulin daily., Disp: 100 each, Rfl: 11   levETIRAcetam (KEPPRA) 750 MG tablet, TAKE 2 TABLETS BY MOUTH 2 (TWO) TIMES DAILY (2AM+ 2EVENING), Disp: 360 tablet, Rfl: 1   mycophenolate (CELLCEPT) 250 MG capsule, Take 250 mg by mouth every 12 (twelve) hours. Take 4 tablets Q 12 hours, Disp: , Rfl:    Oxcarbazepine (TRILEPTAL) 300 MG tablet, TAKE 2 TABLETS (600 MG TOTAL) BY MOUTH 2 (TWO) TIMES DAILY (AM+EVENING), Disp: 360 tablet, Rfl: 1   pantoprazole (PROTONIX) 40 MG tablet, Take 40 mg by mouth daily., Disp: , Rfl:    predniSONE (DELTASONE) 5 MG tablet, Take 7.5 mg by mouth daily with breakfast. Take 1 tablets once a day, Disp: , Rfl:    pregabalin (LYRICA) 300 MG capsule, TAKE 1 CAPSULE (300 MG TOTAL) BY MOUTH AT BEDTIME. (Patient taking differently: Take 150 mg by mouth at bedtime.), Disp: 90 capsule, Rfl: 1   propranolol (INDERAL) 10 MG tablet, Take 10 mg by mouth 2 (  two) times daily., Disp: , Rfl:    rosuvastatin (CRESTOR) 10 MG tablet, Take 20 mg by mouth daily., Disp: , Rfl:    sulfamethoxazole-trimethoprim (BACTRIM) 400-80 MG tablet, Take 1 tablet by mouth once. Take 1 po daily, Disp: , Rfl:    tacrolimus (PROGRAF) 1 MG capsule, Take 1 mg by mouth 2 (two) times daily. Take 7 capsules in the morning and 7 capsules every evening, Disp: , Rfl:    traZODone (DESYREL) 50 MG tablet, Take 50 mg by mouth at bedtime as needed for sleep., Disp: , Rfl:    valGANciclovir (VALCYTE) 450 MG tablet, Take by mouth daily. Take 2 tablets by mouth every evening for 180 days, Disp: , Rfl:   Past Medical History: Past Medical  History:  Diagnosis Date   Chronic systolic CHF (congestive heart failure) (HCC) 06/18/2015   Hemorrhagic stroke (HCC)    Hyperlipidemia    Hypertension    Immune deficiency disorder (HCC)    Nonischemic cardiomyopathy (HCC) Noted as early as 2007   Per chart review (cards consult note 2011), EF of 40% in 2007, down to 20-25% in 2011    Tobacco Use: Social History   Tobacco Use  Smoking Status Never  Smokeless Tobacco Never    Labs: Review Flowsheet  More data exists      Latest Ref Rng & Units 03/21/2022 04/30/2022 05/17/2022 07/29/2022 10/22/2022  Labs for ITP Cardiac and Pulmonary Rehab  Cholestrol 0 - 200 mg/dL - 409  - - -  LDL (calc) 0 - 99 mg/dL - 98  - - -  HDL-C >81 mg/dL - 61  - - -  Trlycerides <150 mg/dL - 82  - - -  Hemoglobin A1c 4.6 - 6.5 % 7.6  - - 9.2  7.6   Bicarbonate 20.0 - 28.0 mmol/L 20.0 - 28.0 mmol/L - - 27.4  27.2  - -  TCO2 22 - 32 mmol/L 22 - 32 mmol/L - - 29  29  - -  O2 Saturation % % - - 70  73  - -    Details       Multiple values from one day are sorted in reverse-chronological order         Capillary Blood Glucose: Lab Results  Component Value Date   GLUCAP 99 05/02/2023   GLUCAP 66 (L) 05/02/2023   GLUCAP 235 (H) 04/04/2023   GLUCAP 253 (H) 04/02/2023   GLUCAP 351 (H) 04/02/2023     Exercise Target Goals: Exercise Program Goal: Individual exercise prescription set using results from initial 6 min walk test and THRR while considering  patient's activity barriers and safety.   Exercise Prescription Goal: Initial exercise prescription builds to 30-45 minutes a day of aerobic activity, 2-3 days per week.  Home exercise guidelines will be given to patient during program as part of exercise prescription that the participant will acknowledge.  Activity Barriers & Risk Stratification:  Activity Barriers & Cardiac Risk Stratification - 03/06/23 1123       Activity Barriers & Cardiac Risk Stratification   Activity Barriers Back  Problems;Deconditioning;Neck/Spine Problems;Shortness of Breath;Balance Concerns;Other (comment)    Comments Pt voices he feels his balance is off sometimes: Achieved 30 seconds on balance test.    Cardiac Risk Stratification High             6 Minute Walk:  6 Minute Walk     Row Name 03/06/23 1012         6 Minute Walk  Phase Initial     Distance 1358 feet     Walk Time 6 minutes     # of Rest Breaks 0     MPH 2.6     METS 4.95     RPE 11     Perceived Dyspnea  1     VO2 Peak 17.32     Symptoms Yes (comment)     Comments SOB, RPD = 1     Resting HR 92 bpm     Resting BP 112/86     Resting Oxygen Saturation  99 %     Exercise Oxygen Saturation  during 6 min walk 98 %     Max Ex. HR 97 bpm     Max Ex. BP 122/80     2 Minute Post BP 110/78              Oxygen Initial Assessment:   Oxygen Re-Evaluation:   Oxygen Discharge (Final Oxygen Re-Evaluation):   Initial Exercise Prescription:  Initial Exercise Prescription - 03/06/23 1100       Date of Initial Exercise RX and Referring Provider   Date 03/06/23    Referring Provider Dr. Edwena Blow [name], MD covering)    Expected Discharge Date 05/27/22      Bike   Level 1.5    Watts 50    Minutes 15    METs 4.95      NuStep   Level 2    SPM 80    Minutes 15    METs 4.95      Prescription Details   Frequency (times per week) 3    Duration Progress to 30 minutes of continuous aerobic without signs/symptoms of physical distress      Intensity   THRR 40-80% of Max Heartrate 72-145    Ratings of Perceived Exertion 11-13    Perceived Dyspnea 0-4      Progression   Progression Continue progressive overload as per policy without signs/symptoms or physical distress.      Resistance Training   Training Prescription Yes    Weight 4 lbs    Reps 10-15             Perform Capillary Blood Glucose checks as needed.  Exercise Prescription Changes:   Exercise Prescription Changes     Row  Name 03/17/23 1015 04/04/23 1400 04/23/23 1400         Response to Exercise   Blood Pressure (Admit) 136/88 118/82 108/78     Blood Pressure (Exercise) 148/88 120/74 118/72     Blood Pressure (Exit) 128/80 124/70 122/84     Heart Rate (Admit) 97 bpm 93 bpm 96 bpm     Heart Rate (Exercise) 115 bpm 110 bpm 109 bpm     Heart Rate (Exit) 106 bpm 99 bpm 100 bpm     Rating of Perceived Exertion (Exercise) 13 12 13      Symptoms Pt voices fatigue after exercise Tired today Tired today     Comments Pt's first day in the CRP2 program Reviewed METs Reviewed METs and goals     Duration Progress to 30 minutes of  aerobic without signs/symptoms of physical distress Continue with 30 min of aerobic exercise without signs/symptoms of physical distress. Continue with 30 min of aerobic exercise without signs/symptoms of physical distress.     Intensity THRR unchanged THRR unchanged THRR unchanged       Progression   Progression Continue to progress workloads to maintain intensity without signs/symptoms  of physical distress. Continue to progress workloads to maintain intensity without signs/symptoms of physical distress. Continue to progress workloads to maintain intensity without signs/symptoms of physical distress.     Average METs 2.95 2.4 2.65       Resistance Training   Training Prescription Yes Yes No     Weight 4 lbs 4 lbs No weights on Wednesdays     Reps 10-15 10-15 --     Time 10 Minutes 10 Minutes --       Interval Training   Interval Training No No No       Bike   Level 1.5 1.5 1.5     Watts 26 21 21      Minutes 8 15 15      METs 3.1 2.7 2.7       NuStep   Level 2 2 2      SPM 97 -- 94     Minutes 15 15 15      METs 2.8 2.1 2.6              Exercise Comments:   Exercise Comments     Row Name 03/17/23 1015 04/04/23 1433 04/23/23 1418 05/07/23 0922     Exercise Comments Will continue to montior patient and progress exercise workloads as tolerated. Slow progression on METS.  Feels tired today. Pt exercise inconsistant due to blood sugar issues and follow-up at [facility]. Continue to encourage pt to progress. Provided encouragement and support as pt feels set back due to his absence form the CRP2 program. Encouraged patient to attend regularly. Pt due fro MET review but was absent. Will review upon return.             Exercise Goals and Review:   Exercise Goals     Row Name 03/06/23 0823             Exercise Goals   Increase Physical Activity Yes       Intervention Provide advice, education, support and counseling about physical activity/exercise needs.;Develop an individualized exercise prescription for aerobic and resistive training based on initial evaluation findings, risk stratification, comorbidities and participant's personal goals.       Expected Outcomes Short Term: Attend rehab on a regular basis to increase amount of physical activity.;Long Term: Exercising regularly at least 3-5 days a week.;Long Term: Add in home exercise to make exercise part of routine and to increase amount of physical activity.       Increase Strength and Stamina Yes       Intervention Provide advice, education, support and counseling about physical activity/exercise needs.;Develop an individualized exercise prescription for aerobic and resistive training based on initial evaluation findings, risk stratification, comorbidities and participant's personal goals.       Expected Outcomes Short Term: Increase workloads from initial exercise prescription for resistance, speed, and METs.;Short Term: Perform resistance training exercises routinely during rehab and add in resistance training at home;Long Term: Improve cardiorespiratory fitness, muscular endurance and strength as measured by increased METs and functional capacity ( )       Able to understand and use rate of perceived exertion (RPE) scale Yes       Intervention Provide education and explanation on how to use RPE scale        Expected Outcomes Short Term: Able to use RPE daily in rehab to express subjective intensity level;Long Term:  Able to use RPE to guide intensity level when exercising independently       Knowledge and understanding of Target  Heart Rate Range (THRR) Yes       Intervention Provide education and explanation of THRR including how the numbers were predicted and where they are located for reference       Expected Outcomes Short Term: Able to state/look up THRR;Short Term: Able to use daily as guideline for intensity in rehab;Long Term: Able to use THRR to govern intensity when exercising independently       Understanding of Exercise Prescription Yes       Intervention Provide education, explanation, and written materials on patient's individual exercise prescription       Expected Outcomes Long Term: Able to explain home exercise prescription to exercise independently;Short Term: Able to explain program exercise prescription                Exercise Goals Re-Evaluation :  Exercise Goals Re-Evaluation     Row Name 03/17/23 1015 04/23/23 1414           Exercise Goal Re-Evaluation   Exercise Goals Review Understanding of Exercise Prescription;Increase Physical Activity;Increase Strength and Stamina;Knowledge and understanding of Target Heart Rate Range (THRR);Able to understand and use rate of perceived exertion (RPE) scale Understanding of Exercise Prescription;Increase Physical Activity;Increase Strength and Stamina;Knowledge and understanding of Target Heart Rate Range (THRR);Able to understand and use rate of perceived exertion (RPE) scale      Comments Pt's first day in the CRP2 program S/P his Heart Transplant. Pt has been in the program before under the diagnosis of CHF. Pt understands the exercise RX, RPE sclae anf THRR. Reviewed METs and goals. Pt was due for MET/goal review on 1/3 bur has been out. Pt has moissed several weeks of the program. Pt voices that he had started making progress on  his goals of increased strength and stamina, and less SOB but as a result of being absent form the CRP2 program he feels he has lost that progress. Pt does feel that his balance is better. He also voices that he is doing better going up the stairs at his apartment.      Expected Outcomes will continue to monitor patient and progress exercise workloads as tolerated. will continue to monitor patient and progress exercise workloads as tolerated.               Discharge Exercise Prescription (Final Exercise Prescription Changes):  Exercise Prescription Changes - 04/23/23 1400       Response to Exercise   Blood Pressure (Admit) 108/78    Blood Pressure (Exercise) 118/72    Blood Pressure (Exit) 122/84    Heart Rate (Admit) 96 bpm    Heart Rate (Exercise) 109 bpm    Heart Rate (Exit) 100 bpm    Rating of Perceived Exertion (Exercise) 13    Symptoms Tired today    Comments Reviewed METs and goals    Duration Continue with 30 min of aerobic exercise without signs/symptoms of physical distress.    Intensity THRR unchanged      Progression   Progression Continue to progress workloads to maintain intensity without signs/symptoms of physical distress.    Average METs 2.65      Resistance Training   Training Prescription No    Weight No weights on Wednesdays      Interval Training   Interval Training No      Bike   Level 1.5    Watts 21    Minutes 15    METs 2.7      NuStep   Level 2  SPM 94    Minutes 15    METs 2.6             Nutrition:  Target Goals: Understanding of nutrition guidelines, daily intake of sodium 1500mg , cholesterol 200mg , calories 30% from fat and 7% or less from saturated fats, daily to have 5 or more servings of fruits and vegetables.  Biometrics:  Pre Biometrics - 03/06/23 0927       Pre Biometrics   Waist Circumference 37.5 inches    Hip Circumference 40.5 inches    Waist to Hip Ratio 0.93 %    Triceps Skinfold 11 mm    % Body Fat 22.3  %    Grip Strength 39 kg    Flexibility 14 in    Single Leg Stand 30 seconds              Nutrition Therapy Plan and Nutrition Goals:  Nutrition Therapy & Goals - 03/10/23 1027       Nutrition Therapy   Diet Heart Healthy/ Carbohydrate Consistent DIet    Drug/Food Interactions Statins/Certain Fruits      Personal Nutrition Goals   Nutrition Goal Patient to identify strategies for reducing cardiovascular risk by attending the [employer] education and nutrition series weekly.    Personal Goal #2 Patient to improve diet quality by using the plate method as a guide for meal planning to include lean protein/plant protein, fruits, vegetables, whole grains, nonfat dairy as part of a well-balanced diet.    Personal Goal #3 Patient to identify strategies for blood sugar control with goal A1c <7%    Comments Vannie has previously completed cardiac rehab in October 2023 prior to heart transplant. He has medical history of LADA treated as type 1 diabetes, HTN, hyperlipidemia, heart transplant 11/26/22. He continues to follow-up with [facility] team and [facility] regarding blood sugar control. His most recent A1c remains above goal (8.7). LDL remains above goal (193). His wife remains an excellent support. Patient will benefit from participation in intensive cardiac rehab for nutrition, exercise, and lifestyle modification.      Intervention Plan   Intervention Prescribe, educate and counsel regarding individualized specific dietary modifications aiming towards targeted core components such as weight, hypertension, lipid management, diabetes, heart failure and other comorbidities.;Nutrition handout(s) given to patient.    Expected Outcomes Short Term Goal: Understand basic principles of dietary content, such as calories, fat, sodium, cholesterol and nutrients.;Long Term Goal: Adherence to prescribed nutrition plan.             Nutrition Assessments:  MEDIFICTS Score Key: >=70  Need to make dietary changes  40-70 Heart Healthy Diet <= 40 Therapeutic Level Cholesterol Diet   Flowsheet Row CARDIAC REHAB PHASE II EXERCISE from 03/19/2023 in [facility], & [facility]  Picture Your Plate Total Score on Admission 53      Picture Your Plate Scores: <57 Unhealthy dietary pattern with much room for improvement. 41-50 Dietary pattern unlikely to meet recommendations for good health and room for improvement. 51-60 More healthful dietary pattern, with some room for improvement.  >60 Healthy dietary pattern, although there may be some specific behaviors that could be improved.    Nutrition Goals Re-Evaluation:  Nutrition Goals Re-Evaluation     Row Name 03/10/23 1027             Goals   Current Weight 176 lb 12.9 oz (80.2 kg)       Comment LDL 193, cholesterol  329, A1c 8.7       Expected Outcome Ziv has previously completed cardiac rehab in October 2023 prior to heart transplant. He has medical history of LADA treated as type 1 diabetes, HTN, hyperlipidemia, heart transplant 11/26/22. He continues to follow-up with [facility] team and [facility] regarding blood sugar control. His most recent A1c remains above goal (8.7). LDL remains above goal (193). His wife remains an excellent support. Patient will benefit from participation in intensive cardiac rehab for nutrition, exercise, and lifestyle modification.                Nutrition Goals Re-Evaluation:  Nutrition Goals Re-Evaluation     Row Name 03/10/23 1027             Goals   Current Weight 176 lb 12.9 oz (80.2 kg)       Comment LDL 193, cholesterol 329, X9J 8.7       Expected Outcome Raeford has previously completed cardiac rehab in October 2023 prior to heart transplant. He has medical history of LADA treated as type 1 diabetes, HTN, hyperlipidemia, heart transplant 11/26/22. He continues to follow-up with [facility] team and [facility]in[facility] regarding blood sugar control. His most recent A1c remains above goal (8.7). LDL remains above goal (193). His wife remains an excellent support. Patient will benefit from participation in intensive cardiac rehab for nutrition, exercise, and lifestyle modification.                Nutrition Goals Discharge (Final Nutrition Goals Re-Evaluation):  Nutrition Goals Re-Evaluation - 03/10/23 1027       Goals   Current Weight 176 lb 12.9 oz (80.2 kg)    Comment LDL 193, cholesterol 329, Y7W 8.7    Expected Outcome Pat has previously completed cardiac rehab in October 2023 prior to heart transplant. He has medical history of LADA treated as type 1 diabetes, HTN, hyperlipidemia, heart transplant 11/26/22. He continues to follow-up with [facility] and [facility] regarding blood sugar control. His most recent A1c remains above goal (8.7). LDL remains above goal (193). His wife remains an excellent support. Patient will benefit from participation in intensive cardiac rehab for nutrition, exercise, and lifestyle modification.             Psychosocial: Target Goals: Acknowledge presence or absence of significant depression and/or stress, maximize coping skills, provide positive support system. Participant is able to verbalize types and ability to use techniques and skills needed for reducing stress and depression.  Initial Review & Psychosocial Screening:  Initial Psych Review & Screening - 03/06/23 1131       Initial Review   Current issues with Current Anxiety/Panic;Current Stress Concerns    Source of Stress Concerns Financial      Family Dynamics   Good Support System? Yes   Pt has his spouse for support     Barriers   Psychosocial barriers to participate in program The patient should benefit from training in stress management and relaxation.      Screening Interventions   Interventions Encouraged to exercise    Expected Outcomes Short Term goal:  Utilizing psychosocial counselor, staff and physician to assist with identification of specific Stressors or current issues interfering with healing process. Setting desired goal for each stressor or current issue identified.;Long Term Goal: Stressors or current issues are controlled or eliminated.;Short Term goal: Identification and review with participant of any Quality of Life or Depression concerns found by scoring the questionnaire.;Long Term goal:  The participant improves quality of Life and PHQ9 Scores as seen by post scores and/or verbalization of changes             Quality of Life Scores:  Quality of Life - 03/06/23 1132       Quality of Life   Select Quality of Life      Quality of Life Scores   Health/Function Pre 18.04 %    Socioeconomic Pre 9.67 %    Psych/Spiritual Pre 23 %    Family Pre 20.63 %    GLOBAL Pre 17.87 %            Scores of 19 and below usually indicate a poorer quality of life in these areas.  A difference of  2-3 points is a clinically meaningful difference.  A difference of 2-3 points in the total score of the Quality of Life Index has been associated with significant improvement in overall quality of life, self-image, physical symptoms, and general health in studies assessing change in quality of life.  PHQ-9: Review Flowsheet  More data exists      03/31/2023 03/20/2023 03/06/2023 08/08/2022 01/18/2022  Depression screen PHQ 2/9  Decreased Interest 1 1 0 0 0  Down, Depressed, Hopeless 1 1 0 1 1  PHQ - 2 Score 2 2 0 1 1  Altered sleeping 0 0 0 0 0  Tired, decreased energy 1 1 0 1 0  Change in appetite 0 0 0 1 0  Feeling bad or failure about yourself  0 0 0 0 1  Trouble concentrating 0 0 0 1 1  Moving slowly or fidgety/restless 0 0 0 0 1  Suicidal thoughts 0 0 0 0 0  PHQ-9 Score 3 3 0 4 4  Difficult doing work/chores Not difficult at all Not difficult at all Not difficult at all - -   Interpretation of Total Score  Total Score Depression  Severity:  1-4 = Minimal depression, 5-9 = Mild depression, 10-14 = Moderate depression, 15-19 = Moderately severe depression, 20-27 = Severe depression   Psychosocial Evaluation and Intervention:   Psychosocial Re-Evaluation:  Psychosocial Re-Evaluation     Row Name 03/18/23 0827 03/20/23 0929 04/18/23 1401 05/15/23 0952       Psychosocial Re-Evaluation   Current issues with Current Stress Concerns;Current Anxiety/Panic Current Stress Concerns;Current Anxiety/Panic Current Stress Concerns;Current Anxiety/Panic Current Stress Concerns;Current Anxiety/Panic    Comments Jadier did not voice any  increased concerns or stressors on his first day of exercise at cardiac rehab. Will Review quality of life questionnaire in the upcoming week. Quality of life questionnaire reviewed. Lexton says that he and his wife are struggling to pay bills and rent. Maurie says he is receiving disability but does not think he is receiving enough to make ends meet. Keylon says he will reach out to his transplant coordinator/ social worker regarding this. Jamareon says he is able to buy groceries with the assistance he receives. Unable to reassess. Boyce's last day of exercise was on 04/04/23 Unable to reassess. Ky's attendance has been inconsistant. Aceson's last day of exercise was on 04/30/23.    Expected Outcomes Justo will have decreased or controlled anxiety and stress upon completion of cardiac rehab. Stancil will have decreased or controlled anxiety and stress upon completion of cardiac rehab. Mackson will have decreased or controlled anxiety and stress upon completion of cardiac rehab. Danford will have decreased or controlled anxiety and stress upon completion of cardiac rehab.    Interventions Stress management  education;Encouraged to attend Cardiac Rehabilitation for the exercise;Relaxation education Stress management education;Encouraged to attend Cardiac Rehabilitation for the exercise;Relaxation education  Stress management education;Encouraged to attend Cardiac Rehabilitation for the exercise;Relaxation education Stress management education;Encouraged to attend Cardiac Rehabilitation for the exercise;Relaxation education    Continue Psychosocial Services  Follow up required by staff Follow up required by staff Follow up required by staff Follow up required by staff      Initial Review   Source of Stress Concerns Occupation;Chronic Illness;Unable to participate in former interests or hobbies;Unable to perform yard/household activities;Financial Occupation;Chronic Illness;Unable to participate in former interests or hobbies;Unable to perform yard/household activities;Financial Occupation;Chronic Illness;Unable to participate in former interests or hobbies;Unable to perform yard/household activities;Financial Occupation;Chronic Illness;Unable to participate in former interests or hobbies;Unable to perform yard/household activities;Financial    Comments Linden continue to recover from his heart transplant in October.. Will continue to monitor and offer support as needed Mackay continue to recover from his heart transplant in October.. Will continue to monitor and offer support as needed Nikkolas continue to recover from his heart transplant in October.. Will continue to monitor and offer support as needed Dondi continue to recover from his heart transplant in October.. Will continue to monitor and offer support as needed             Psychosocial Discharge (Final Psychosocial Re-Evaluation):  Psychosocial Re-Evaluation - 05/15/23 9562       Psychosocial Re-Evaluation   Current issues with Current Stress Concerns;Current Anxiety/Panic    Comments Unable to reassess. Dewaine's attendance has been inconsistant. Hondo's last day of exercise was on 04/30/23.    Expected Outcomes Haven will have decreased or controlled anxiety and stress upon completion of cardiac rehab.    Interventions Stress management  education;Encouraged to attend Cardiac Rehabilitation for the exercise;Relaxation education    Continue Psychosocial Services  Follow up required by staff      Initial Review   Source of Stress Concerns Occupation;Chronic Illness;Unable to participate in former interests or hobbies;Unable to perform yard/household activities;Financial    Comments Jamareon continue to recover from his heart transplant in October.. Will continue to monitor and offer support as needed             Vocational Rehabilitation: Provide vocational rehab assistance to qualifying candidates.   Vocational Rehab Evaluation & Intervention:  Vocational Rehab - 03/06/23 0935       Initial Vocational Rehab Evaluation & Intervention   Assessment shows need for Vocational Rehabilitation No   pt on disability            Education: Education Goals: Education classes will be provided on a weekly basis, covering required topics. Participant will state understanding/return demonstration of topics presented.    Education     Row Name 03/10/23 0900     Education   Cardiac Education Topics Pritikin   Select Core Videos     Core Videos   Educator Exercise Physiologist   Select Psychosocial   Psychosocial Healthy Minds, Bodies, Hearts   Instruction Review Code 1- Verbalizes Understanding   Class Start Time 0805   Class Stop Time 0840   Class Time Calculation (min) 35 min    Row Name 03/17/23 0800     Education   Cardiac Education Topics Pritikin   Select Core Videos     Core Videos   Educator Exercise Physiologist   Select Nutrition   Nutrition Other   Instruction Review Code 1- Verbalizes Understanding   Class Start Time [phone]  Class Stop Time 0855   Class Time Calculation (min) 41 min    Row Name 03/19/23 1000     Education   Cardiac Education Topics Pritikin   Secondary school teacher School   Educator Nurse   Weekly Topic Tasty Appetizers and Snacks   Instruction Review Code 1-  Verbalizes Understanding   Class Start Time [phone]   Class Stop Time 0843   Class Time Calculation (min) 26 min    Row Name 03/21/23 0900     Education   Cardiac Education Topics Pritikin   Glass blower/designer Nutrition   Nutrition Workshop Label Reading   Instruction Review Code 1- Verbalizes Understanding   Class Start Time 0815   Class Stop Time 0856   Class Time Calculation (min) 41 min    Row Name 03/31/23 0900     Education   Cardiac Education Topics Pritikin   Select Workshops     Workshops   Educator Exercise Physiologist   Select Exercise   Exercise Workshop Exercise Basics: Building Your Action Plan   Instruction Review Code 1- Verbalizes Understanding   Class Start Time [phone]   Class Stop Time 0850   Class Time Calculation (min) 38 min    Row Name 04/02/23 1300     Education   Cardiac Education Topics Pritikin   Orthoptist   Educator Nurse   Weekly Topic One-Pot Wonders   Instruction Review Code 1- Verbalizes Understanding   Class Start Time [phone]   Class Stop Time 0849   Class Time Calculation (min) 33 min    Row Name 04/04/23 0800     Education   Cardiac Education Topics Pritikin   Psychologist, forensic Exercise Education   Exercise Education Move It!   Instruction Review Code 1- Verbalizes Understanding   Class Start Time 0815   Class Stop Time 0855   Class Time Calculation (min) 40 min    Row Name 04/23/23 1100     Education   Cardiac Education Topics Pritikin   Secondary school teacher School   Educator Nurse;Respiratory Therapist   Weekly Topic Comforting Weekend Breakfasts   Instruction Review Code 1- Verbalizes Understanding   Class Start Time 0818   Class Stop Time 0858   Class Time Calculation (min) 40 min    Row Name 04/30/23 1400     Education   Cardiac Education Topics Pritikin    Secondary school teacher School   Educator Nurse;Exercise Physiologist   Weekly Topic Fast Evening Meals   Instruction Review Code 1- Verbalizes Understanding   Class Start Time [phone]   Class Stop Time 1478   Class Time Calculation (min) 36 min            Core Videos: Exercise    Move It!  Clinical staff conducted group or individual video education with verbal and written material and guidebook.  Patient learns the recommended Pritikin exercise program. Exercise with the goal of living a long, healthy life. Some of the health benefits of exercise include controlled diabetes, healthier blood pressure levels, improved cholesterol levels, improved heart and lung capacity, improved sleep, and better body composition. Everyone should speak with their doctor before starting or changing an exercise routine.  Biomechanical  Limitations Clinical staff conducted group or individual video education with verbal and written material and guidebook.  Patient learns how biomechanical limitations can impact exercise and how we can mitigate and possibly overcome limitations to have an impactful and balanced exercise routine.  Body Composition Clinical staff conducted group or individual video education with verbal and written material and guidebook.  Patient learns that body composition (ratio of muscle mass to fat mass) is a key component to assessing overall fitness, rather than body weight alone. Increased fat mass, especially visceral belly fat, can put [place] at increased risk for metabolic syndrome, type 2 diabetes, heart disease, and even death. It is recommended to combine diet and exercise (cardiovascular and resistance training) to improve your body composition. Seek guidance from your physician and exercise physiologist before implementing an exercise routine.  Exercise Action Plan Clinical staff conducted group or individual video education with verbal and written material and guidebook.   Patient learns the recommended strategies to achieve and enjoy long-term exercise adherence, including variety, self-motivation, self-efficacy, and positive decision making. Benefits of exercise include fitness, good health, weight management, more energy, better sleep, less stress, and overall well-being.  Medical   Heart Disease Risk Reduction Clinical staff conducted group or individual video education with verbal and written material and guidebook.  Patient learns our heart is our most vital organ as it circulates oxygen, nutrients, white blood cells, and hormones throughout the entire body, and carries waste away. Data supports a plant-based eating plan like the Pritikin Program for its effectiveness in slowing progression of and reversing heart disease. The video provides a number of recommendations to address heart disease.   Metabolic Syndrome and Belly Fat  Clinical staff conducted group or individual video education with verbal and written material and guidebook.  Patient learns what metabolic syndrome is, how it leads to heart disease, and how one can reverse it and keep it from coming back. You have metabolic syndrome if you have 3 of the following 5 criteria: abdominal obesity, high blood pressure, high triglycerides, low HDL cholesterol, and high blood sugar.  Hypertension and Heart Disease Clinical staff conducted group or individual video education with verbal and written material and guidebook.  Patient learns that high blood pressure, or hypertension, is very common in the [place]. Hypertension is largely due to excessive salt intake, but other important risk factors include being overweight, physical inactivity, drinking too much alcohol, smoking, and not eating enough potassium from fruits and vegetables. High blood pressure is a leading risk factor for heart attack, stroke, congestive heart failure, dementia, kidney failure, and premature death. Long-term effects of  excessive salt intake include stiffening of the arteries and thickening of heart muscle and organ damage. Recommendations include ways to reduce hypertension and the risk of heart disease.  Diseases of Our Time - Focusing on Diabetes Clinical staff conducted group or individual video education with verbal and written material and guidebook.  Patient learns why the best way to stop diseases of our time is prevention, through food and other lifestyle changes. Medicine (such as prescription pills and surgeries) is often only a Band-Aid on the problem, not a long-term solution. Most common diseases of our time include obesity, type 2 diabetes, hypertension, heart disease, and cancer. The Pritikin Program is recommended and has been proven to help reduce, reverse, and/or prevent the damaging effects of metabolic syndrome.  Nutrition   Overview of the Pritikin Eating Plan  Clinical staff conducted group or individual video education with verbal  and written material and guidebook.  Patient learns about the Pritikin Eating Plan for disease risk reduction. The Pritikin Eating Plan emphasizes a wide variety of unrefined, minimally-processed carbohydrates, like fruits, vegetables, whole grains, and legumes. Go, Caution, and Stop food choices are explained. Plant-based and lean animal proteins are emphasized. Rationale provided for low sodium intake for blood pressure control, low added sugars for blood sugar stabilization, and low added fats and oils for coronary artery disease risk reduction and weight management.  Calorie Density  Clinical staff conducted group or individual video education with verbal and written material and guidebook.  Patient learns about calorie density and how it impacts the Pritikin Eating Plan. Knowing the characteristics of the food you choose will help you decide whether those foods will lead to weight gain or weight loss, and whether you want to consume more or less of them. Weight  loss is usually a side effect of the Pritikin Eating Plan because of its focus on low calorie-dense foods.  Label Reading  Clinical staff conducted group or individual video education with verbal and written material and guidebook.  Patient learns about the Pritikin recommended label reading guidelines and corresponding recommendations regarding calorie density, added sugars, sodium content, and whole grains.  Dining Out - Part 1  Clinical staff conducted group or individual video education with verbal and written material and guidebook.  Patient learns that restaurant meals can be sabotaging because they can be so high in calories, fat, sodium, and/or sugar. Patient learns recommended strategies on how to positively address this and avoid unhealthy pitfalls.  Facts on Fats  Clinical staff conducted group or individual video education with verbal and written material and guidebook.  Patient learns that lifestyle modifications can be just as effective, if not more so, as many medications for lowering your risk of heart disease. A Pritikin lifestyle can help to reduce your risk of inflammation and atherosclerosis (cholesterol build-up, or plaque, in the artery walls). Lifestyle interventions such as dietary choices and physical activity address the cause of atherosclerosis. A review of the types of fats and their impact on blood cholesterol levels, along with dietary recommendations to reduce fat intake is also included.  Nutrition Action Plan  Clinical staff conducted group or individual video education with verbal and written material and guidebook.  Patient learns how to incorporate Pritikin recommendations into their lifestyle. Recommendations include planning and keeping personal health goals in mind as an important part of their success.  Healthy Mind-Set    Healthy Minds, Bodies, Hearts  Clinical staff conducted group or individual video education with verbal and written material and  guidebook.  Patient learns how to identify when they are stressed. Video will discuss the impact of that stress, as well as the many benefits of stress management. Patient will also be introduced to stress management techniques. The way we think, act, and feel has an impact on our hearts.  How Our Thoughts Can Heal Our Hearts  Clinical staff conducted group or individual video education with verbal and written material and guidebook.  Patient learns that negative thoughts can cause depression and anxiety. This can result in negative lifestyle behavior and serious health problems. Cognitive behavioral therapy is an effective method to help control our thoughts in order to change and improve our emotional outlook.  Additional Videos:  Exercise    Improving Performance  Clinical staff conducted group or individual video education with verbal and written material and guidebook.  Patient learns to use a non-linear approach  by alternating intensity levels and lengths of time spent exercising to help burn more calories and lose more body fat. Cardiovascular exercise helps improve heart health, metabolism, hormonal balance, blood sugar control, and recovery from fatigue. Resistance training improves strength, endurance, balance, coordination, reaction time, metabolism, and muscle mass. Flexibility exercise improves circulation, posture, and balance. Seek guidance from your physician and exercise physiologist before implementing an exercise routine and learn your capabilities and proper form for all exercise.  Introduction to Yoga  Clinical staff conducted group or individual video education with verbal and written material and guidebook.  Patient learns about yoga, a discipline of the coming together of mind, breath, and body. The benefits of yoga include improved flexibility, improved range of motion, better posture and core strength, increased lung function, weight loss, and positive self-image. Yoga's  heart health benefits include lowered blood pressure, healthier heart rate, decreased cholesterol and triglyceride levels, improved immune function, and reduced stress. Seek guidance from your physician and exercise physiologist before implementing an exercise routine and learn your capabilities and proper form for all exercise.  Medical   Aging: Enhancing Your Quality of Life  Clinical staff conducted group or individual video education with verbal and written material and guidebook.  Patient learns key strategies and recommendations to stay in good physical health and enhance quality of life, such as prevention strategies, having an advocate, securing a Health Care Proxy and Power of Attorney, and keeping a list of medications and system for tracking them. It also discusses how to avoid risk for bone loss.  Biology of Weight Control  Clinical staff conducted group or individual video education with verbal and written material and guidebook.  Patient learns that weight gain occurs because we consume more calories than we burn (eating more, moving less). Even if your body weight is normal, you may have higher ratios of fat compared to muscle mass. Too much body fat puts you at increased risk for cardiovascular disease, heart attack, stroke, type 2 diabetes, and obesity-related cancers. In addition to exercise, following the Pritikin Eating Plan can help reduce your risk.  Decoding Lab Results  Clinical staff conducted group or individual video education with verbal and written material and guidebook.  Patient learns that lab test reflects one measurement whose values change over time and are influenced by many factors, including medication, stress, sleep, exercise, food, hydration, pre-existing medical conditions, and more. It is recommended to use the knowledge from this video to become more involved with your lab results and evaluate your numbers to speak with your doctor.   Diseases of Our Time -  Overview  Clinical staff conducted group or individual video education with verbal and written material and guidebook.  Patient learns that according to the CDC, 50% to 70% of chronic diseases (such as obesity, type 2 diabetes, elevated lipids, hypertension, and heart disease) are avoidable through lifestyle improvements including healthier food choices, listening to satiety cues, and increased physical activity.  Sleep Disorders Clinical staff conducted group or individual video education with verbal and written material and guidebook.  Patient learns how good quality and duration of sleep are important to overall health and well-being. Patient also learns about sleep disorders and how they impact health along with recommendations to address them, including discussing with a physician.  Nutrition  Dining Out - Part 2 Clinical staff conducted group or individual video education with verbal and written material and guidebook.  Patient learns how to plan ahead and communicate in order to maximize their  dining experience in a healthy and nutritious manner. Included are recommended food choices based on the type of restaurant the patient is visiting.   Fueling a Banker conducted group or individual video education with verbal and written material and guidebook.  There is a strong connection between our food choices and our health. Diseases like obesity and type 2 diabetes are very prevalent and are in large-part due to lifestyle choices. The Pritikin Eating Plan provides plenty of food and hunger-curbing satisfaction. It is easy to follow, affordable, and helps reduce health risks.  Menu Workshop  Clinical staff conducted group or individual video education with verbal and written material and guidebook.  Patient learns that restaurant meals can sabotage health goals because they are often packed with calories, fat, sodium, and sugar. Recommendations include strategies to plan  ahead and to communicate with the manager, chef, or server to help order a healthier meal.  Planning Your Eating Strategy  Clinical staff conducted group or individual video education with verbal and written material and guidebook.  Patient learns about the Pritikin Eating Plan and its benefit of reducing the risk of disease. The Pritikin Eating Plan does not focus on calories. Instead, it emphasizes high-quality, nutrient-rich foods. By knowing the characteristics of the foods, we choose, we can determine their calorie density and make informed decisions.  Targeting Your Nutrition Priorities  Clinical staff conducted group or individual video education with verbal and written material and guidebook.  Patient learns that lifestyle habits have a tremendous impact on disease risk and progression. This video provides eating and physical activity recommendations based on your personal health goals, such as reducing LDL cholesterol, losing weight, preventing or controlling type 2 diabetes, and reducing high blood pressure.  Vitamins and Minerals  Clinical staff conducted group or individual video education with verbal and written material and guidebook.  Patient learns different ways to obtain key vitamins and minerals, including through a recommended healthy diet. It is important to discuss all supplements you take with your doctor.   Healthy Mind-Set    Smoking Cessation  Clinical staff conducted group or individual video education with verbal and written material and guidebook.  Patient learns that cigarette smoking and tobacco addiction pose a serious health risk which affects millions of people. Stopping smoking will significantly reduce the risk of heart disease, lung disease, and many forms of cancer. Recommended strategies for quitting are covered, including working with your doctor to develop a successful plan.  Culinary   Becoming a Set designer conducted group or  individual video education with verbal and written material and guidebook.  Patient learns that cooking at home can be healthy, cost-effective, quick, and puts them in control. Keys to cooking healthy recipes will include looking at your recipe, assessing your equipment needs, planning ahead, making it simple, choosing cost-effective seasonal ingredients, and limiting the use of added fats, salts, and sugars.  Cooking - Breakfast and Snacks  Clinical staff conducted group or individual video education with verbal and written material and guidebook.  Patient learns how important breakfast is to satiety and nutrition through the entire day. Recommendations include key foods to eat during breakfast to help stabilize blood sugar levels and to prevent overeating at meals later in the day. Planning ahead is also a key component.  Cooking - Educational psychologist conducted group or individual video education with verbal and written material and guidebook.  Patient learns eating strategies to improve overall  health, including an approach to cook more at home. Recommendations include thinking of animal protein as a side on your plate rather than center stage and focusing instead on lower calorie dense options like vegetables, fruits, whole grains, and plant-based proteins, such as beans. Making sauces in large quantities to freeze for later and leaving the skin on your vegetables are also recommended to maximize your experience.  Cooking - Healthy Salads and Dressing Clinical staff conducted group or individual video education with verbal and written material and guidebook.  Patient learns that vegetables, fruits, whole grains, and legumes are the foundations of the Pritikin Eating Plan. Recommendations include how to incorporate each of these in flavorful and healthy salads, and how to create homemade salad dressings. Proper handling of ingredients is also covered. Cooking - Soups and [employer] - Soups and Desserts Clinical staff conducted group or individual video education with verbal and written material and guidebook.  Patient learns that Pritikin soups and desserts make for easy, nutritious, and delicious snacks and meal components that are low in sodium, fat, sugar, and calorie density, while high in vitamins, minerals, and filling fiber. Recommendations include simple and healthy ideas for soups and desserts.   Overview     The Pritikin Solution Program Overview Clinical staff conducted group or individual video education with verbal and written material and guidebook.  Patient learns that the results of the Pritikin Program have been documented in more than 100 articles published in peer-reviewed journals, and the benefits include reducing risk factors for (and, in some cases, even reversing) high cholesterol, high blood pressure, type 2 diabetes, obesity, and more! An overview of the three key pillars of the Pritikin Program will be covered: eating well, doing regular exercise, and having a healthy mind-set.  WORKSHOPS  Exercise: Exercise Basics: Building Your Action Plan Clinical staff led group instruction and group discussion with PowerPoint presentation and patient guidebook. To enhance the learning environment the use of posters, models and videos may be added. At the conclusion of this workshop, patients will comprehend the difference between physical activity and exercise, as well as the benefits of incorporating both, into their routine. Patients will understand the FITT (Frequency, Intensity, Time, and Type) principle and how to use it to build an exercise action plan. In addition, safety concerns and other considerations for exercise and cardiac rehab will be addressed by the presenter. The purpose of this lesson is to promote a comprehensive and effective weekly exercise routine in order to improve patients' overall level of fitness.   Managing Heart  Disease: Your Path to a Healthier Heart Clinical staff led group instruction and group discussion with PowerPoint presentation and patient guidebook. To enhance the learning environment the use of posters, models and videos may be added.At the conclusion of this workshop, patients will understand the anatomy and physiology of the heart. Additionally, they will understand how Pritikin's three pillars impact the risk factors, the progression, and the management of heart disease.  The purpose of this lesson is to provide a high-level overview of the heart, heart disease, and how the Pritikin lifestyle positively impacts risk factors.  Exercise Biomechanics Clinical staff led group instruction and group discussion with PowerPoint presentation and patient guidebook. To enhance the learning environment the use of posters, models and videos may be added. Patients will learn how the structural parts of their bodies function and how these functions impact their daily activities, movement, and exercise. Patients will learn how to promote a neutral spine,  learn how to manage pain, and identify ways to improve their physical movement in order to promote healthy living. The purpose of this lesson is to expose patients to common physical limitations that impact physical activity. Participants will learn practical ways to adapt and manage aches and pains, and to minimize their effect on regular exercise. Patients will learn how to maintain good posture while sitting, walking, and lifting.  Balance Training and Fall Prevention  Clinical staff led group instruction and group discussion with PowerPoint presentation and patient guidebook. To enhance the learning environment the use of posters, models and videos may be added. At the conclusion of this workshop, patients will understand the importance of their sensorimotor skills (vision, proprioception, and the vestibular system) in maintaining their ability to  balance as they age. Patients will apply a variety of balancing exercises that are appropriate for their current level of function. Patients will understand the common causes for poor balance, possible solutions to these problems, and ways to modify their physical environment in order to minimize their fall risk. The purpose of this lesson is to teach patients about the importance of maintaining balance as they age and ways to minimize their risk of falling.  WORKSHOPS   Nutrition:  Fueling a Ship broker led group instruction and group discussion with PowerPoint presentation and patient guidebook. To enhance the learning environment the use of posters, models and videos may be added. Patients will review the foundational principles of the Pritikin Eating Plan and understand what constitutes a serving size in each of the food groups. Patients will also learn Pritikin-friendly foods that are better choices when away from home and review make-ahead meal and snack options. Calorie density will be reviewed and applied to three nutrition priorities: weight maintenance, weight loss, and weight gain. The purpose of this lesson is to reinforce (in a group setting) the key concepts around what patients are recommended to eat and how to apply these guidelines when away from home by planning and selecting Pritikin-friendly options. Patients will understand how calorie density may be adjusted for different weight management goals.  Mindful Eating  Clinical staff led group instruction and group discussion with PowerPoint presentation and patient guidebook. To enhance the learning environment the use of posters, models and videos may be added. Patients will briefly review the concepts of the Pritikin Eating Plan and the importance of low-calorie dense foods. The concept of mindful eating will be introduced as well as the importance of paying attention to internal hunger signals. Triggers for non-hunger  eating and techniques for dealing with triggers will be explored. The purpose of this lesson is to provide patients with the opportunity to review the basic principles of the Pritikin Eating Plan, discuss the value of eating mindfully and how to measure internal cues of hunger and fullness using the Hunger Scale. Patients will also discuss reasons for non-hunger eating and learn strategies to use for controlling emotional eating.  Targeting Your Nutrition Priorities Clinical staff led group instruction and group discussion with PowerPoint presentation and patient guidebook. To enhance the learning environment the use of posters, models and videos may be added. Patients will learn how to determine their genetic susceptibility to disease by reviewing their family history. Patients will gain insight into the importance of diet as part of an overall healthy lifestyle in mitigating the impact of genetics and other environmental insults. The purpose of this lesson is to provide patients with the opportunity to assess their personal nutrition priorities by  looking at their family history, their own health history and current risk factors. Patients will also be able to discuss ways of prioritizing and modifying the Pritikin Eating Plan for their highest risk areas  Menu  Clinical staff led group instruction and group discussion with PowerPoint presentation and patient guidebook. To enhance the learning environment the use of posters, models and videos may be added. Using menus brought in from [employer], or printed from [employer], patients will apply the Pritikin dining out guidelines that were presented in the [employer] video. Patients will also be able to practice these guidelines in a variety of provided scenarios. The purpose of this lesson is to provide patients with the opportunity to practice hands-on learning of the Pritikin Dining Out guidelines with actual menus and practice  scenarios.  Label Reading Clinical staff led group instruction and group discussion with PowerPoint presentation and patient guidebook. To enhance the learning environment the use of posters, models and videos may be added. Patients will review and discuss the Pritikin label reading guidelines presented in Pritikin's Label Reading Educational series video. Using fool labels brought in from local grocery stores and markets, patients will apply the label reading guidelines and determine if the packaged food meet the Pritikin guidelines. The purpose of this lesson is to provide patients with the opportunity to review, discuss, and practice hands-on learning of the Pritikin Label Reading guidelines with actual packaged food labels. Cooking School  Pritikin's [employer] are designed to teach patients ways to prepare quick, simple, and affordable recipes at home. The importance of nutrition's role in chronic disease risk reduction is reflected in its emphasis in the overall Pritikin program. By learning how to prepare essential core Pritikin Eating Plan recipes, patients will increase control over what they eat; be able to customize the flavor of foods without the use of added salt, sugar, or fat; and improve the quality of the food they consume. By learning a set of core recipes which are easily assembled, quickly prepared, and affordable, patients are more likely to prepare more healthy foods at home. These workshops focus on convenient breakfasts, simple entres, side dishes, and desserts which can be prepared with minimal effort and are consistent with nutrition recommendations for cardiovascular risk reduction. Cooking [employer] are taught by a Armed forces logistics/support/administrative officer (RD) who has been trained by the [employer]. The chef or RD has a clear understanding of the importance of minimizing - if not completely eliminating - added fat, sugar, and sodium in recipes. Throughout  the series of Cooking School Workshop sessions, patients will learn about healthy ingredients and efficient methods of cooking to build confidence in their capability to prepare    Cooking School weekly topics:  Adding Flavor- Sodium-Free  Fast and Healthy Breakfasts  Powerhouse Plant-Based Proteins  Satisfying Salads and Dressings  Simple Sides and Sauces  International Cuisine-Spotlight on the [employer] Zones  Delicious Desserts  Savory Soups  [employer] - Meals in a Astronomer Appetizers and Snacks  Comforting Weekend Breakfasts  One-Pot Wonders   Fast Evening Meals  Landscape architect Your Pritikin Plate  WORKSHOPS   Healthy Mindset (Psychosocial):  Focused Goals, Sustainable Changes Clinical staff led group instruction and group discussion with PowerPoint presentation and patient guidebook. To enhance the learning environment the use of posters, models and videos may be added. Patients will be able to apply effective goal setting strategies to establish at least one personal goal,  and then take consistent, meaningful action toward that goal. They will learn to identify common barriers to achieving personal goals and develop strategies to overcome them. Patients will also gain an understanding of how our mind-set can impact our ability to achieve goals and the importance of cultivating a positive and growth-oriented mind-set. The purpose of this lesson is to provide patients with a deeper understanding of how to set and achieve personal goals, as well as the tools and strategies needed to overcome common obstacles which may arise along the way.  From Head to Heart: The Power of a Healthy Outlook  Clinical staff led group instruction and group discussion with PowerPoint presentation and patient guidebook. To enhance the learning environment the use of posters, models and videos may be added. Patients will be able to recognize and describe the impact of emotions and  mood on physical health. They will discover the importance of self-care and explore self-care practices which may work for them. Patients will also learn how to utilize the 4 C's to cultivate a healthier outlook and better manage stress and challenges. The purpose of this lesson is to demonstrate to patients how a healthy outlook is an essential part of maintaining good health, especially as they continue their cardiac rehab journey.  Healthy Sleep for a Healthy Heart Clinical staff led group instruction and group discussion with PowerPoint presentation and patient guidebook. To enhance the learning environment the use of posters, models and videos may be added. At the conclusion of this workshop, patients will be able to demonstrate knowledge of the importance of sleep to overall health, well-being, and quality of life. They will understand the symptoms of, and treatments for, common sleep disorders. Patients will also be able to identify daytime and nighttime behaviors which impact sleep, and they will be able to apply these tools to help manage sleep-related challenges. The purpose of this lesson is to provide patients with a general overview of sleep and outline the importance of quality sleep. Patients will learn about a few of the most common sleep disorders. Patients will also be introduced to the concept of "sleep hygiene," and discover ways to self-manage certain sleeping problems through simple daily behavior changes. Finally, the workshop will motivate patients by clarifying the links between quality sleep and their goals of heart-healthy living.   Recognizing and Reducing Stress Clinical staff led group instruction and group discussion with PowerPoint presentation and patient guidebook. To enhance the learning environment the use of posters, models and videos may be added. At the conclusion of this workshop, patients will be able to understand the types of stress reactions, differentiate between  acute and chronic stress, and recognize the impact that chronic stress has on their health. They will also be able to apply different coping mechanisms, such as reframing negative self-talk. Patients will have the opportunity to practice a variety of stress management techniques, such as deep abdominal breathing, progressive muscle relaxation, and/or guided imagery.  The purpose of this lesson is to educate patients on the role of stress in their lives and to provide healthy techniques for coping with it.  Learning Barriers/Preferences:  Learning Barriers/Preferences - 03/06/23 0934       Learning Barriers/Preferences   Learning Barriers Sight   wears glasses   Learning Preferences Audio;Computer/Internet;Group Instruction;Individual Instruction;Pictoral;Skilled Demonstration;Verbal Instruction;Video;Written Material             Education Topics:  Knowledge Questionnaire Score:  Knowledge Questionnaire Score - 03/06/23 1133  Knowledge Questionnaire Score   Pre Score 22/24             Core Components/Risk Factors/Patient Goals at Admission:  Personal Goals and Risk Factors at Admission - 03/06/23 1133       Core Components/Risk Factors/Patient Goals on Admission   Improve shortness of breath with ADL's Yes    Intervention Provide education, individualized exercise plan and daily activity instruction to help decrease symptoms of SOB with activities of daily living.    Expected Outcomes Short Term: Improve cardiorespiratory fitness to achieve a reduction of symptoms when performing ADLs;Long Term: Be able to perform more ADLs without symptoms or delay the onset of symptoms    Diabetes Yes    Intervention Provide education about proper nutrition, including hydration, and aerobic/resistive exercise prescription along with prescribed medications to achieve blood glucose in normal ranges: Fasting glucose 65-99 mg/dL;Provide education about signs/symptoms and action to take for  hypo/hyperglycemia.    Expected Outcomes Long Term: Attainment of HbA1C < 7%.;Short Term: Participant verbalizes understanding of the signs/symptoms and immediate care of hyper/hypoglycemia, proper foot care and importance of medication, aerobic/resistive exercise and nutrition plan for blood glucose control.    Hypertension Yes    Intervention Provide education on lifestyle modifcations including regular physical activity/exercise, weight management, moderate sodium restriction and increased consumption of fresh fruit, vegetables, and low fat dairy, alcohol moderation, and smoking cessation.;Monitor prescription use compliance.    Expected Outcomes Short Term: Continued assessment and intervention until BP is < 140/31mm HG in hypertensive participants. < 130/34mm HG in hypertensive participants with diabetes, heart failure or chronic kidney disease.;Long Term: Maintenance of blood pressure at goal levels.    Lipids Yes    Intervention Provide education and support for participant on nutrition & aerobic/resistive exercise along with prescribed medications to achieve LDL 70mg , HDL >40mg .    Expected Outcomes Short Term: Participant states understanding of desired cholesterol values and is compliant with medications prescribed. Participant is following exercise prescription and nutrition guidelines.;Long Term: Cholesterol controlled with medications as prescribed, with individualized exercise RX and with personalized nutrition plan. Value goals: LDL < 70mg , HDL > 40 mg.    Stress Yes    Intervention Offer individual and/or small group education and counseling on adjustment to heart disease, stress management and health-related lifestyle change. Teach and support self-help strategies.    Expected Outcomes Short Term: Participant demonstrates changes in health-related behavior, relaxation and other stress management skills, ability to obtain effective social support, and compliance with psychotropic  medications if prescribed.;Long Term: Emotional wellbeing is indicated by absence of clinically significant psychosocial distress or social isolation.             Core Components/Risk Factors/Patient Goals Review:   Goals and Risk Factor Review     Row Name 03/18/23 1610 03/20/23 0932 04/18/23 1404 05/15/23 0958       Core Components/Risk Factors/Patient Goals Review   Personal Goals Review Weight Management/Obesity;Lipids;Hypertension;Improve shortness of breath with ADL's;Diabetes Weight Management/Obesity;Lipids;Hypertension;Improve shortness of breath with ADL's;Diabetes Weight Management/Obesity;Lipids;Hypertension;Improve shortness of breath with ADL's;Diabetes Weight Management/Obesity;Lipids;Hypertension;Improve shortness of breath with ADL's;Diabetes    Review Jocelyn started cardiac rehab on 03/17/23. Kahner did well with exercise. Vital signs and CBG's were stable. Aboubacar was reminded to eat and hydrate properly before coming to exercise at cardiac rehab. Lawrance started cardiac rehab on 03/17/23. Valdemar is off to a good start to  exercise. Vital signs and CBG's have been stable this week. Stepen has gained 2.3 kg since starting cardiac  rehab Khaleem's last day of attendance at  cardiac rehab was on 04/04/23.  Jakoby has gained 3.4 kg since starting cardiac rehab. CBG's have been elevated. Patient's PCP Dr Laural Benes was notified. Patient is supposed to get a new CGM Garet's last day of attendance at  cardiac rehab was on 04/30/23.  Kristoph has gained 4.0  kg since starting cardiac rehab. CBG's have been variable. Attendance has been inconsistant.    Expected Outcomes Eliga will continue to participate in cardiac rehab for exercise, nutrition and lifestyle modifications. Alwin will continue to participate in cardiac rehab for exercise, nutrition and lifestyle modifications. Braxtyn will continue to participate in cardiac rehab for exercise, nutrition and lifestyle modifications. Dejion will  continue to participate in cardiac rehab for exercise, nutrition and lifestyle modifications.             Core Components/Risk Factors/Patient Goals at Discharge (Final Review):   Goals and Risk Factor Review - 05/15/23 0958       Core Components/Risk Factors/Patient Goals Review   Personal Goals Review Weight Management/Obesity;Lipids;Hypertension;Improve shortness of breath with ADL's;Diabetes    Review Rocio's last day of attendance at  cardiac rehab was on 04/30/23.  Eshawn has gained 4.0  kg since starting cardiac rehab. CBG's have been variable. Attendance has been inconsistant.    Expected Outcomes Raphael will continue to participate in cardiac rehab for exercise, nutrition and lifestyle modifications.             ITP Comments:  ITP Comments     Row Name 03/06/23 [phone] 03/18/23 0824 03/20/23 0928 04/18/23 1359 05/15/23 0951   ITP Comments Dr. Armanda Magic medical director. Introduction to pritikin education/intensive cardiac rehab. Initial orientation packet reviewed with patient. 30 Day ITP Review. Taichi strarted cardiac rehab on 03/17/23 and did well with exercise. 30 Day ITP Review. Recardo strarted cardiac rehab on 03/17/23. Boris is off to a good start to exercise. 30 Day ITP Review. Randol's attendance at cardiac rehab has been poor on recent visit's. Ariz syas he has been out due to the holdiays. Damarco's last day of attendance was on 04/04/23 30 Day ITP Review. Dardan's attendance at cardiac rehab remains poor  Ngai's last day of attendance was on 04/30/23            Comments: See ITP comments.Thayer Headings RN BSN

## 2023-05-16 ENCOUNTER — Encounter (HOSPITAL_COMMUNITY): Payer: Medicaid Other

## 2023-05-19 ENCOUNTER — Encounter (HOSPITAL_COMMUNITY): Admission: RE | Admit: 2023-05-19 | Payer: Medicaid Other | Source: Ambulatory Visit

## 2023-05-21 ENCOUNTER — Encounter (HOSPITAL_COMMUNITY): Payer: Medicaid Other

## 2023-05-23 ENCOUNTER — Telehealth (HOSPITAL_COMMUNITY): Payer: Self-pay | Admitting: *Deleted

## 2023-05-23 ENCOUNTER — Encounter (HOSPITAL_COMMUNITY): Payer: Self-pay | Admitting: *Deleted

## 2023-05-23 ENCOUNTER — Encounter (HOSPITAL_COMMUNITY): Payer: Medicaid Other

## 2023-05-23 DIAGNOSIS — Z48298 Encounter for aftercare following other organ transplant: Secondary | ICD-10-CM | POA: Diagnosis not present

## 2023-05-23 DIAGNOSIS — Z941 Heart transplant status: Secondary | ICD-10-CM

## 2023-05-23 DIAGNOSIS — Z789 Other specified health status: Secondary | ICD-10-CM | POA: Diagnosis not present

## 2023-05-23 DIAGNOSIS — Z79899 Other long term (current) drug therapy: Secondary | ICD-10-CM | POA: Diagnosis not present

## 2023-05-23 DIAGNOSIS — Z2989 Encounter for other specified prophylactic measures: Secondary | ICD-10-CM | POA: Diagnosis not present

## 2023-05-23 NOTE — Telephone Encounter (Signed)
 Spoke with Doroteo Gasmen. Will discharge from cardiac rehab due to nonattendance. Francisca's last day of attendance was on 04/1523. [place] RN BSN

## 2023-05-23 NOTE — Progress Notes (Signed)
 Discharge Progress Report  Patient Details  Name: Jason Stevenson MRN: [id number] Date of Birth: [birth year] Referring Provider:   Flowsheet Row CARDIAC REHAB PHASE II ORIENTATION from 03/06/2023 in [facility]  Referring Provider Dr. Bulah Earley Holland, MD covering)        Number of Visits: 16  Reason for Discharge:  Discharged due to non attendance  Smoking History:  Social History   Tobacco Use  Smoking Status Never  Smokeless Tobacco Never    Diagnosis:  11/26/22 S/P orthotopic heart transplant [facility])  ADL UCSD:   Initial Exercise Prescription:   Discharge Exercise Prescription (Final Exercise Prescription Changes):  Exercise Prescription Changes - 05/02/23 1055       Response to Exercise   Blood Pressure (Admit) 130/90    Blood Pressure (Exercise) 130/80    Blood Pressure (Exit) 132/93    Heart Rate (Admit) 92 bpm    Heart Rate (Exercise) 110 bpm    Heart Rate (Exit) 100 bpm    Rating of Perceived Exertion (Exercise) 12    Symptoms None    Comments Pt's last exercise session    Duration Continue with 30 min of aerobic exercise without signs/symptoms of physical distress.    Intensity THRR unchanged      Progression   Progression Continue to progress workloads to maintain intensity without signs/symptoms of physical distress.    Average METs 2.75      Resistance Training   Training Prescription No      Interval Training   Interval Training No      Bike   Level 1.5    Watts 22    Minutes 15    METs 2.8      NuStep   Level 2    Minutes 15    METs 2.7             Functional Capacity:   Psychological, QOL, Others - Outcomes: PHQ 2/9:    03/31/2023    2:51 PM 03/20/2023    3:24 PM 03/06/2023   11:28 AM 08/08/2022    9:53 AM 01/18/2022    7:37 AM  Depression screen PHQ 2/9  Decreased Interest 1 1 0 0 0  Down, Depressed, Hopeless 1 1 0 1 1  PHQ - 2 Score 2 2 0 1 1   Altered sleeping 0 0 0 0 0  Tired, decreased energy 1 1 0 1 0  Change in appetite 0 0 0 1 0  Feeling bad or failure about yourself  0 0 0 0 1  Trouble concentrating 0 0 0 1 1  Moving slowly or fidgety/restless 0 0 0 0 1  Suicidal thoughts 0 0 0 0 0  PHQ-9 Score 3 3 0 4 4  Difficult doing work/chores Not difficult at all Not difficult at all Not difficult at all      Quality of Life:   Personal Goals: Goals established at orientation with interventions provided to work toward goal.    Personal Goals Discharge:  Goals and Risk Factor Review     Row Name 04/18/23 1404 05/15/23 0958           Core Components/Risk Factors/Patient Goals Review   Personal Goals Review Weight Management/Obesity;Lipids;Hypertension;Improve shortness of breath with ADL's;Diabetes Weight Management/Obesity;Lipids;Hypertension;Improve shortness of breath with ADL's;Diabetes      Review Jason Stevenson's last day of attendance at  cardiac rehab was on 04/04/23.  Jason Stevenson has gained 3.4 kg since starting cardiac  rehab. CBG's have been elevated. Patient's PCP Dr Jason Stevenson was notified. Patient is supposed to get a new CGM Jason Stevenson's last day of attendance at  cardiac rehab was on 04/30/23.  Jason Stevenson has gained 4.0  kg since starting cardiac rehab. CBG's have been variable. Attendance has been inconsistant.      Expected Outcomes Jason Stevenson will continue to participate in cardiac rehab for exercise, nutrition and lifestyle modifications. Jason Stevenson will continue to participate in cardiac rehab for exercise, nutrition and lifestyle modifications.               Exercise Goals and Review:   Exercise Goals Re-Evaluation:  Exercise Goals Re-Evaluation     Row Name 04/23/23 1414             Exercise Goal Re-Evaluation   Exercise Goals Review Understanding of Exercise Prescription;Increase Physical Activity;Increase Strength and Stamina;Knowledge and understanding of Target Heart Rate Range (THRR);Able to understand and use rate  of perceived exertion (RPE) scale       Comments Reviewed METs and goals. Pt was due for MET/goal review on 1/3 bur has been out. Pt has moissed several weeks of the program. Pt voices that he had started making progress on his goals of increased strength and stamina, and less SOB but as a result of being absent form the CRP2 program he feels he has lost that progress. Pt does feel that his balance is better. He also voices that he is doing better going up the stairs at his apartment.       Expected Outcomes will continue to monitor patient and progress exercise workloads as tolerated.                Nutrition & Weight - Outcomes:    Nutrition:   Nutrition Discharge:   Education Questionnaire Score:   Jason Stevenson attended 8 exercise 8 education sessions between 03/06/23-04/29/22. Jason Stevenson did well with exercise when in attendance. Jason Stevenson had problems getting here due to transportation and lack of motivation. I encouraged Jason Stevenson to continue exercise on his own.Jason Elpidio Quan RN BSN

## 2023-05-26 ENCOUNTER — Encounter (HOSPITAL_COMMUNITY): Payer: Medicaid Other

## 2023-05-28 ENCOUNTER — Encounter (HOSPITAL_COMMUNITY): Payer: Medicaid Other

## 2023-06-16 DIAGNOSIS — Z2989 Encounter for other specified prophylactic measures: Secondary | ICD-10-CM | POA: Diagnosis not present

## 2023-06-16 DIAGNOSIS — I129 Hypertensive chronic kidney disease with stage 1 through stage 4 chronic kidney disease, or unspecified chronic kidney disease: Secondary | ICD-10-CM | POA: Diagnosis not present

## 2023-06-16 DIAGNOSIS — Z941 Heart transplant status: Secondary | ICD-10-CM | POA: Diagnosis not present

## 2023-06-16 DIAGNOSIS — Z4821 Encounter for aftercare following heart transplant: Secondary | ICD-10-CM | POA: Diagnosis not present

## 2023-06-16 DIAGNOSIS — Z789 Other specified health status: Secondary | ICD-10-CM | POA: Diagnosis not present

## 2023-06-16 DIAGNOSIS — Z8673 Personal history of transient ischemic attack (TIA), and cerebral infarction without residual deficits: Secondary | ICD-10-CM | POA: Diagnosis not present

## 2023-06-16 DIAGNOSIS — Z48298 Encounter for aftercare following other organ transplant: Secondary | ICD-10-CM | POA: Diagnosis not present

## 2023-06-16 DIAGNOSIS — Z794 Long term (current) use of insulin: Secondary | ICD-10-CM | POA: Diagnosis not present

## 2023-06-16 DIAGNOSIS — N529 Male erectile dysfunction, unspecified: Secondary | ICD-10-CM | POA: Diagnosis not present

## 2023-06-16 DIAGNOSIS — Z79899 Other long term (current) drug therapy: Secondary | ICD-10-CM | POA: Diagnosis not present

## 2023-06-16 DIAGNOSIS — E1122 Type 2 diabetes mellitus with diabetic chronic kidney disease: Secondary | ICD-10-CM | POA: Diagnosis not present

## 2023-06-16 DIAGNOSIS — I071 Rheumatic tricuspid insufficiency: Secondary | ICD-10-CM | POA: Diagnosis not present

## 2023-06-16 DIAGNOSIS — R251 Tremor, unspecified: Secondary | ICD-10-CM | POA: Diagnosis not present

## 2023-06-16 DIAGNOSIS — N189 Chronic kidney disease, unspecified: Secondary | ICD-10-CM | POA: Diagnosis not present

## 2023-06-29 ENCOUNTER — Other Ambulatory Visit: Payer: Self-pay

## 2023-06-29 ENCOUNTER — Encounter (HOSPITAL_COMMUNITY): Payer: Self-pay | Admitting: *Deleted

## 2023-06-29 ENCOUNTER — Emergency Department (HOSPITAL_COMMUNITY)

## 2023-06-29 ENCOUNTER — Emergency Department (HOSPITAL_COMMUNITY)
Admission: EM | Admit: 2023-06-29 | Discharge: 2023-06-29 | Disposition: A | Discharge status: Other Acute Inpatient Hospital | Attending: Emergency Medicine | Admitting: Emergency Medicine

## 2023-06-29 DIAGNOSIS — Z941 Heart transplant status: Secondary | ICD-10-CM | POA: Diagnosis not present

## 2023-06-29 DIAGNOSIS — R112 Nausea with vomiting, unspecified: Secondary | ICD-10-CM | POA: Diagnosis not present

## 2023-06-29 DIAGNOSIS — E875 Hyperkalemia: Secondary | ICD-10-CM | POA: Insufficient documentation

## 2023-06-29 DIAGNOSIS — Z8673 Personal history of transient ischemic attack (TIA), and cerebral infarction without residual deficits: Secondary | ICD-10-CM | POA: Insufficient documentation

## 2023-06-29 DIAGNOSIS — Z0389 Encounter for observation for other suspected diseases and conditions ruled out: Secondary | ICD-10-CM | POA: Diagnosis not present

## 2023-06-29 DIAGNOSIS — I110 Hypertensive heart disease with heart failure: Secondary | ICD-10-CM | POA: Diagnosis not present

## 2023-06-29 DIAGNOSIS — R110 Nausea: Secondary | ICD-10-CM | POA: Insufficient documentation

## 2023-06-29 DIAGNOSIS — R109 Unspecified abdominal pain: Secondary | ICD-10-CM | POA: Diagnosis not present

## 2023-06-29 DIAGNOSIS — Z79899 Other long term (current) drug therapy: Secondary | ICD-10-CM | POA: Insufficient documentation

## 2023-06-29 DIAGNOSIS — I5022 Chronic systolic (congestive) heart failure: Secondary | ICD-10-CM | POA: Diagnosis not present

## 2023-06-29 DIAGNOSIS — E1165 Type 2 diabetes mellitus with hyperglycemia: Secondary | ICD-10-CM | POA: Diagnosis not present

## 2023-06-29 DIAGNOSIS — Z794 Long term (current) use of insulin: Secondary | ICD-10-CM | POA: Diagnosis not present

## 2023-06-29 DIAGNOSIS — I7090 Unspecified atherosclerosis: Secondary | ICD-10-CM | POA: Diagnosis not present

## 2023-06-29 DIAGNOSIS — R1084 Generalized abdominal pain: Secondary | ICD-10-CM | POA: Insufficient documentation

## 2023-06-29 DIAGNOSIS — Z7982 Long term (current) use of aspirin: Secondary | ICD-10-CM | POA: Diagnosis not present

## 2023-06-29 LAB — BASIC METABOLIC PANEL
Anion gap: 7 (ref 5–15)
BUN: 21 mg/dL — ABNORMAL HIGH (ref 6–20)
CO2: 23 mmol/L (ref 22–32)
Calcium: 8.3 mg/dL — ABNORMAL LOW (ref 8.9–10.3)
Chloride: 109 mmol/L (ref 98–111)
Creatinine, Ser: 1.21 mg/dL (ref 0.61–1.24)
GFR, Estimated: >60 mL/min (ref >60)
Glucose, Bld: 253 mg/dL — ABNORMAL HIGH (ref 70–99)
Potassium: 5.5 mmol/L — ABNORMAL HIGH (ref 3.5–5.1)
Sodium: 139 mmol/L (ref 135–145)

## 2023-06-29 LAB — CBC WITH DIFFERENTIAL/PLATELET
Abs Immature Granulocytes: 0 10*3/uL (ref 0.00–0.07)
Basophils Absolute: 0 10*3/uL (ref 0.0–0.1)
Basophils Relative: 1 %
Eosinophils Absolute: 0.1 10*3/uL (ref 0.0–0.5)
Eosinophils Relative: 2 %
HCT: 34.1 % — ABNORMAL LOW (ref 39.0–52.0)
Hemoglobin: 10.7 g/dL — ABNORMAL LOW (ref 13.0–17.0)
Lymphocytes Relative: 26 %
Lymphs Abs: 0.8 10*3/uL (ref 0.7–4.0)
MCH: 28.5 pg (ref 26.0–34.0)
MCHC: 31.4 g/dL (ref 30.0–36.0)
MCV: 90.7 fL (ref 80.0–100.0)
Monocytes Absolute: 0.2 10*3/uL (ref 0.1–1.0)
Monocytes Relative: 7 %
Neutro Abs: 1.9 10*3/uL (ref 1.7–7.7)
Neutrophils Relative %: 64 %
Platelets: 237 10*3/uL (ref 150–400)
RBC: 3.76 MIL/uL — ABNORMAL LOW (ref 4.22–5.81)
RDW: 12.6 % (ref 11.5–15.5)
WBC: 2.9 10*3/uL — ABNORMAL LOW (ref 4.0–10.5)
nRBC: 0 % (ref 0.0–0.2)
nRBC: 1 /100{WBCs} — ABNORMAL HIGH

## 2023-06-29 LAB — COMPREHENSIVE METABOLIC PANEL
ALT: 16 U/L (ref 0–44)
AST: 30 U/L (ref 15–41)
Albumin: 3.8 g/dL (ref 3.5–5.0)
Alkaline Phosphatase: 54 U/L (ref 38–126)
Anion gap: 8 (ref 5–15)
BUN: 24 mg/dL — ABNORMAL HIGH (ref 6–20)
CO2: 23 mmol/L (ref 22–32)
Calcium: 9 mg/dL (ref 8.9–10.3)
Chloride: 106 mmol/L (ref 98–111)
Creatinine, Ser: 1.43 mg/dL — ABNORMAL HIGH (ref 0.61–1.24)
GFR, Estimated: >60 mL/min (ref >60)
Glucose, Bld: 291 mg/dL — ABNORMAL HIGH (ref 70–99)
Potassium: 5.6 mmol/L — ABNORMAL HIGH (ref 3.5–5.1)
Sodium: 137 mmol/L (ref 135–145)
Total Bilirubin: 0.7 mg/dL (ref 0.0–1.2)
Total Protein: 7.1 g/dL (ref 6.5–8.1)

## 2023-06-29 LAB — RESP PANEL BY RT-PCR (RSV, FLU A&B, COVID)  RVPGX2
Influenza A by PCR: NEGATIVE
Influenza B by PCR: NEGATIVE
Resp Syncytial Virus by PCR: NEGATIVE
SARS Coronavirus 2 by RT PCR: NEGATIVE

## 2023-06-29 LAB — LIPASE, BLOOD: Lipase: 22 U/L (ref 11–51)

## 2023-06-29 MED ORDER — IOHEXOL 350 MG/ML SOLN
75.0000 mL | Freq: Once | INTRAVENOUS | Status: AC | PRN
  Administered 2023-06-29: 75 mL via INTRAVENOUS

## 2023-06-29 MED ORDER — SODIUM CHLORIDE 0.9 % IV BOLUS
500.0000 mL | Freq: Once | INTRAVENOUS | Status: AC
  Administered 2023-06-29: 500 mL via INTRAVENOUS

## 2023-06-29 MED ORDER — ONDANSETRON HCL 4 MG/2ML IJ SOLN
4.0000 mg | Freq: Once | INTRAMUSCULAR | Status: AC
  Administered 2023-06-29: 4 mg via INTRAVENOUS
  Filled 2023-06-29: qty 2

## 2023-06-29 NOTE — ED Notes (Signed)
 Patient transported to CT via stretcher.

## 2023-06-29 NOTE — ED Provider Notes (Addendum)
 [place] EMERGENCY DEPARTMENT AT [facility] Provider Note   CSN: [id number] Arrival date & time: 06/29/23  1603     History  Chief Complaint  Patient presents with   Abdominal Pain    Jason Stevenson is a 38 y.o. male.   Abdominal Pain Patient with his GI of abdominal pain.  Did have heart transplant at [facility] August 13.  Earlier this month was stopped on his Valcyte.  Over the last week has been feeling worse.  Some abdominal pain.  Diffuse.  Has had nausea.  May have had a small amount of diarrhea.  No fevers.  No cough.  No known sick contacts.    Past Medical History:  Diagnosis Date   Chronic systolic CHF (congestive heart failure) (HCC) 06/18/2015   Hemorrhagic stroke (HCC)    Hyperlipidemia    Hypertension    Immune deficiency disorder (HCC)    Nonischemic cardiomyopathy (HCC) Noted as early as 2007   Per chart review (cards consult note 2011), EF of 40% in 2007, down to 20-25% in 2011      Past Surgical History:  Procedure Laterality Date   ICD IMPLANT N/A 08/27/2021   Procedure: ICD IMPLANT;  Surgeon: Lanier Prude, MD;  Location: [facility] INVASIVE CV LAB;  Service: Cardiovascular;  Laterality: N/A;   None     RIGHT HEART CATH  06/21/2021   [facility]   RIGHT HEART CATH N/A 05/17/2022   Procedure: RIGHT HEART CATH;  Surgeon: Laurey Morale, MD;  Location: [facility] INVASIVE CV LAB;  Service: Cardiovascular;  Laterality: N/A;   RIGHT/LEFT HEART CATH AND CORONARY ANGIOGRAPHY N/A 11/02/2020   Procedure: RIGHT/LEFT HEART CATH AND CORONARY ANGIOGRAPHY;  Surgeon: Laurey Morale, MD;  Location: [facility] INVASIVE CV LAB;  Service: Cardiovascular;  Laterality: N/A;    Home Medications Prior to Admission medications   Medication Sig Start Date End Date Taking? Authorizing Provider  ACCU-CHEK GUIDE test strip USE TO CHECK BLOOD SUGAR THREE TIMES DAILY. 07/03/22   Marcine Matar, MD  acetaminophen (TYLENOL) 325 MG tablet Take 1-2 tablets (325-650 mg total) by  mouth every 4 (four) hours as needed for mild pain. 08/28/21   Graciella Freer, PA-C  amLODipine (NORVASC) 5 MG tablet Take 5 mg by mouth daily.    [provider]  aspirin EC 81 MG tablet Take 81 mg by mouth daily. Swallow whole.    [provider]  Blood Glucose Monitoring Suppl (ACCU-CHEK GUIDE ME) w/Device KIT USE TO CHECK BLOOD SUGAR THREE TIMES DAILY. 07/04/22   Marcine Matar, MD  Blood Pressure Monitor DEVI Use as directed to check home blood pressure 2-3 times a week 10/12/18   Marcine Matar, MD  Calcium Carbonate-Vitamin D (CALTRATE 600+D PO) Take 600 mg by mouth in the morning and at bedtime.    [provider]  Continuous Glucose Sensor (FREESTYLE LIBRE 2 SENSOR) MISC UAD 04/16/23   Marcine Matar, MD  DULoxetine (CYMBALTA) 60 MG capsule Take 60 mg by mouth daily. 02/25/23   [provider]  EASY COMFORT PEN NEEDLES 32G X 4 MM MISC USE TO INJECT INSULIN FOUR TIMES A DAY 01/31/23   Marcine Matar, MD  furosemide (LASIX) 20 MG tablet TAKE 1 TABLET (20 MG TOTAL) BY MOUTH 3 (THREE) TIMES A WEEK. MONDAYS WEDNESDAYS AND FRIDAY Patient taking differently: Take 20 mg by mouth daily as needed. 11/26/22   Milford, Anderson Malta, FNP  GLUCAGON NA Place 3 sprays into the  nose once as needed.    [provider]  insulin glargine (LANTUS) 100 UNIT/ML injection Inject 24 Units into the skin daily.    [provider]  insulin lispro (HUMALOG KWIKPEN) 100 UNIT/ML KwikPen Inject 12 Units into the skin.    [provider]  Insulin Syringe-Needle U-100 (RELION INSULIN SYRINGE) 31G X 15/64" 0.3 ML MISC Use to inject insulin daily. 07/13/18   Marcine Matar, MD  levETIRAcetam (KEPPRA) 750 MG tablet TAKE 2 TABLETS BY MOUTH 2 (TWO) TIMES DAILY (2AM+ 2EVENING) 11/28/22   Penumalli, Glenford Bayley, MD  mycophenolate (CELLCEPT) 250 MG capsule Take 250 mg by mouth every 12 (twelve) hours. Take 4 tablets Q 12 hours    [provider]   Oxcarbazepine (TRILEPTAL) 300 MG tablet TAKE 2 TABLETS (600 MG TOTAL) BY MOUTH 2 (TWO) TIMES DAILY (AM+EVENING) 11/28/22   Penumalli, Glenford Bayley, MD  pantoprazole (PROTONIX) 40 MG tablet Take 40 mg by mouth daily.    [provider]  predniSONE (DELTASONE) 5 MG tablet Take 7.5 mg by mouth daily with breakfast. Take 1 tablets once a day    [provider]  pregabalin (LYRICA) 300 MG capsule TAKE 1 CAPSULE (300 MG TOTAL) BY MOUTH AT BEDTIME. Patient taking differently: Take 150 mg by mouth at bedtime. 11/27/22   Marcine Matar, MD  propranolol (INDERAL) 10 MG tablet Take 10 mg by mouth 2 (two) times daily.    [provider]  rosuvastatin (CRESTOR) 10 MG tablet Take 20 mg by mouth daily.    [provider]  sulfamethoxazole-trimethoprim (BACTRIM) 400-80 MG tablet Take 1 tablet by mouth once. Take 1 po daily    [provider]  tacrolimus (PROGRAF) 1 MG capsule Take 1 mg by mouth 2 (two) times daily. Take 7 capsules in the morning and 7 capsules every evening    [provider]  traZODone (DESYREL) 50 MG tablet Take 50 mg by mouth at bedtime as needed for sleep.    [provider]  valGANciclovir (VALCYTE) 450 MG tablet Take by mouth daily. Take 2 tablets by mouth every evening for 180 days    [provider]      Allergies    Patient has no known allergies.    Review of Systems   Review of Systems  Gastrointestinal:  Positive for abdominal pain.    Physical Exam Updated Vital Signs BP (!) 137/108   Pulse 82   Temp 97.9 F (36.6 C)   Resp 20   Ht 6' (1.829 m)   Wt 79.4 kg   SpO2 95%   BMI 23.74 kg/m  Physical Exam Vitals and nursing note reviewed.  Cardiovascular:     Rate and Rhythm: Normal rate.  Pulmonary:     Breath sounds: No wheezing or rhonchi.  Abdominal:     Comments: Abdomen nontender.  Skin:    Capillary Refill: Capillary refill takes less than 2 seconds.  Neurological:     Mental Status: He  is alert.     Comments: Awake and answers questions but mildly slow to answer.     ED Results / Procedures / Treatments   Labs (all labs ordered are listed, but only abnormal results are displayed) Labs Reviewed  COMPREHENSIVE METABOLIC PANEL - Abnormal; Notable for the following components:      Result Value   Potassium 5.6 (*)    Glucose, Bld 291 (*)    BUN 24 (*)    Creatinine, Ser 1.43 (*)  All other components within normal limits  CBC WITH DIFFERENTIAL/PLATELET - Abnormal; Notable for the following components:   WBC 2.9 (*)    RBC 3.76 (*)    Hemoglobin 10.7 (*)    HCT 34.1 (*)    nRBC 1 (*)    All other components within normal limits  BASIC METABOLIC PANEL - Abnormal; Notable for the following components:   Potassium 5.5 (*)    Glucose, Bld 253 (*)    BUN 21 (*)    Calcium 8.3 (*)    All other components within normal limits  RESP PANEL BY RT-PCR (RSV, FLU A&B, COVID)  RVPGX2  LIPASE, BLOOD  TACROLIMUS LEVEL  MYCOPHENOLIC ACID (CELLCEPT)  CMV DNA, QUANTITATIVE, PCR    EKG None  Radiology CT ABDOMEN PELVIS W CONTRAST Result Date: 06/29/2023 CLINICAL DATA:  Abdominal pain. EXAM: CT ABDOMEN AND PELVIS WITH CONTRAST TECHNIQUE: Multidetector CT imaging of the abdomen and pelvis was performed using the standard protocol following bolus administration of intravenous contrast. RADIATION DOSE REDUCTION: This exam was performed according to the departmental dose-optimization program which includes automated exposure control, adjustment of the mA and/or kV according to patient size and/or use of iterative reconstruction technique. CONTRAST:  75mL OMNIPAQUE IOHEXOL 350 MG/ML SOLN COMPARISON:  March 19, 2022 FINDINGS: Lower chest: No acute abnormality. Multiple sternal wires are noted. Hepatobiliary: No focal liver abnormality is seen. No gallstones, gallbladder wall thickening, or biliary dilatation. Pancreas: Unremarkable. No pancreatic ductal dilatation or surrounding  inflammatory changes. Spleen: Normal in size without focal abnormality. Adrenals/Urinary Tract: Adrenal glands are unremarkable. Kidneys are normal in size, without renal calculi or hydronephrosis. Cortical scarring is seen along the posterior aspect of the left kidney and lower pole of the right kidney. Bladder is unremarkable. Stomach/Bowel: Stomach is within normal limits. Appendix appears normal. No evidence of bowel wall thickening, distention, or inflammatory changes. Vascular/Lymphatic: Mild aortic atherosclerosis. No enlarged abdominal or pelvic lymph nodes. Reproductive: Prostate is unremarkable. Other: No abdominal wall hernia or abnormality. No abdominopelvic ascites. Musculoskeletal: No acute or significant osseous findings. IMPRESSION: 1. No acute or active process within the abdomen or pelvis. 2. Aortic atherosclerosis. Aortic Atherosclerosis (ICD10-I70.0). Electronically Signed   By: Aram Candela M.D.   On: 06/29/2023 20:57    Procedures Procedures    Medications Ordered in ED Medications  ondansetron (ZOFRAN) injection 4 mg (4 mg Intravenous Given 06/29/23 1704)  sodium chloride 0.9 % bolus 500 mL (0 mLs Intravenous Stopped 06/29/23 1849)  iohexol (OMNIPAQUE) 350 MG/ML injection 75 mL (75 mLs Intravenous Contrast Given 06/29/23 2047)    ED Course/ Medical Decision Making/ A&P                                 Medical Decision Making Amount and/or Complexity of Data Reviewed Labs: ordered. Radiology: ordered.  Risk Prescription drug management.   Patient with nausea.  Some abdominal pain.  Feeling weak.  Differential diagnosis is long but includes causes such as gastroenteritis.  Also transplant related considered.  Reviewing cardiology notes/transplant notes it appears that the donor heart was CMV positive but patient was CMV negative.  Will get levels of his medicines but also get a CMV DNA test.  Likely will not come back colleagues in the ER.  Will get basic blood work  and give small fluid bolus.  Also give antiemetic.  Creatinine increased but has mild hyperkalemia.  Initial tube had analysis.  Patient feeling  somewhat better.  White count mildly low compared to prior.  CT scan done and reassuring.  Discussed with CCU fellow at [facility].  Requests transfer ER to ER.  Accepting physician Dr. Michae Kava.  Patient will go by transfer service.         Final Clinical Impression(s) / ED Diagnoses Final diagnoses:  Heart transplant recipient [facility])  Nausea    Rx / DC Orders ED Discharge Orders     None         Benjiman Core, MD 06/29/23 2138    Benjiman Core, MD 06/29/23 2158

## 2023-06-29 NOTE — ED Notes (Signed)
 Transport called to take pt to [facility]

## 2023-06-29 NOTE — ED Notes (Signed)
 ED Provider at bedside. Per Dr. Rubin Payor, pt informed they may take their regular home medications as scheduled at this time.

## 2023-06-29 NOTE — ED Provider Notes (Signed)
 Patient is currently being transferred to [facility], appears stable on examination for transfer.   Pricilla Loveless, MD 06/29/23 2250

## 2023-06-29 NOTE — ED Triage Notes (Signed)
 The pt is c/o abd pain for approx one week with nausea vomiting and diarrhea  hx of a heart transplant 2024

## 2023-06-30 DIAGNOSIS — Z941 Heart transplant status: Secondary | ICD-10-CM | POA: Diagnosis not present

## 2023-06-30 DIAGNOSIS — Z0389 Encounter for observation for other suspected diseases and conditions ruled out: Secondary | ICD-10-CM | POA: Diagnosis not present

## 2023-06-30 DIAGNOSIS — R42 Dizziness and giddiness: Secondary | ICD-10-CM | POA: Diagnosis not present

## 2023-06-30 DIAGNOSIS — R112 Nausea with vomiting, unspecified: Secondary | ICD-10-CM | POA: Diagnosis not present

## 2023-06-30 DIAGNOSIS — D849 Immunodeficiency, unspecified: Secondary | ICD-10-CM | POA: Diagnosis not present

## 2023-07-01 DIAGNOSIS — D849 Immunodeficiency, unspecified: Secondary | ICD-10-CM | POA: Diagnosis not present

## 2023-07-01 DIAGNOSIS — R42 Dizziness and giddiness: Secondary | ICD-10-CM | POA: Diagnosis not present

## 2023-07-01 DIAGNOSIS — R112 Nausea with vomiting, unspecified: Secondary | ICD-10-CM | POA: Diagnosis not present

## 2023-07-01 DIAGNOSIS — Z941 Heart transplant status: Secondary | ICD-10-CM | POA: Diagnosis not present

## 2023-07-01 LAB — TACROLIMUS LEVEL: Tacrolimus (FK506) - LabCorp: 12.1 ng/mL (ref 5.0–20.0)

## 2023-07-02 DIAGNOSIS — Z941 Heart transplant status: Secondary | ICD-10-CM | POA: Diagnosis not present

## 2023-07-02 DIAGNOSIS — D849 Immunodeficiency, unspecified: Secondary | ICD-10-CM | POA: Diagnosis not present

## 2023-07-02 DIAGNOSIS — R112 Nausea with vomiting, unspecified: Secondary | ICD-10-CM | POA: Diagnosis not present

## 2023-07-02 DIAGNOSIS — R42 Dizziness and giddiness: Secondary | ICD-10-CM | POA: Diagnosis not present

## 2023-07-02 LAB — MYCOPHENOLIC ACID (CELLCEPT)
MPA Glucuronide: 56 ug/mL (ref 15–125)
MPA: 1.8 ug/mL (ref 1.0–3.5)

## 2023-07-02 LAB — CMV DNA, QUANTITATIVE, PCR
CMV DNA Quant: NEGATIVE [IU]/mL
Log10 CMV Qn DNA Pl: UNDETERMINED {Log_IU}/mL

## 2023-07-28 DIAGNOSIS — I609 Nontraumatic subarachnoid hemorrhage, unspecified: Secondary | ICD-10-CM | POA: Diagnosis not present

## 2023-07-28 DIAGNOSIS — G9389 Other specified disorders of brain: Secondary | ICD-10-CM | POA: Diagnosis not present

## 2023-07-28 DIAGNOSIS — I615 Nontraumatic intracerebral hemorrhage, intraventricular: Secondary | ICD-10-CM | POA: Diagnosis not present

## 2023-07-28 DIAGNOSIS — I610 Nontraumatic intracerebral hemorrhage in hemisphere, subcortical: Secondary | ICD-10-CM | POA: Diagnosis not present

## 2023-08-01 DIAGNOSIS — I619 Nontraumatic intracerebral hemorrhage, unspecified: Secondary | ICD-10-CM | POA: Diagnosis not present

## 2023-08-01 DIAGNOSIS — J9600 Acute respiratory failure, unspecified whether with hypoxia or hypercapnia: Secondary | ICD-10-CM | POA: Diagnosis not present

## 2023-08-01 DIAGNOSIS — I610 Nontraumatic intracerebral hemorrhage in hemisphere, subcortical: Secondary | ICD-10-CM | POA: Diagnosis not present

## 2023-08-01 DIAGNOSIS — Z941 Heart transplant status: Secondary | ICD-10-CM | POA: Diagnosis not present

## 2023-08-01 DIAGNOSIS — I10 Essential (primary) hypertension: Secondary | ICD-10-CM | POA: Diagnosis not present

## 2023-08-01 DIAGNOSIS — G8194 Hemiplegia, unspecified affecting left nondominant side: Secondary | ICD-10-CM | POA: Diagnosis not present

## 2023-08-02 DIAGNOSIS — J9600 Acute respiratory failure, unspecified whether with hypoxia or hypercapnia: Secondary | ICD-10-CM | POA: Diagnosis not present

## 2023-08-02 DIAGNOSIS — I619 Nontraumatic intracerebral hemorrhage, unspecified: Secondary | ICD-10-CM | POA: Diagnosis not present

## 2023-08-02 DIAGNOSIS — I10 Essential (primary) hypertension: Secondary | ICD-10-CM | POA: Diagnosis not present

## 2023-08-02 DIAGNOSIS — G8194 Hemiplegia, unspecified affecting left nondominant side: Secondary | ICD-10-CM | POA: Diagnosis not present

## 2023-08-02 DIAGNOSIS — G936 Cerebral edema: Secondary | ICD-10-CM | POA: Diagnosis not present

## 2023-08-02 DIAGNOSIS — I610 Nontraumatic intracerebral hemorrhage in hemisphere, subcortical: Secondary | ICD-10-CM | POA: Diagnosis not present

## 2023-08-02 DIAGNOSIS — Z941 Heart transplant status: Secondary | ICD-10-CM | POA: Diagnosis not present

## 2023-08-03 DIAGNOSIS — G8194 Hemiplegia, unspecified affecting left nondominant side: Secondary | ICD-10-CM | POA: Diagnosis not present

## 2023-08-03 DIAGNOSIS — J690 Pneumonitis due to inhalation of food and vomit: Secondary | ICD-10-CM | POA: Diagnosis not present

## 2023-08-03 DIAGNOSIS — I619 Nontraumatic intracerebral hemorrhage, unspecified: Secondary | ICD-10-CM | POA: Diagnosis not present

## 2023-08-03 DIAGNOSIS — I10 Essential (primary) hypertension: Secondary | ICD-10-CM | POA: Diagnosis not present

## 2023-08-03 DIAGNOSIS — G936 Cerebral edema: Secondary | ICD-10-CM | POA: Diagnosis not present

## 2023-08-03 DIAGNOSIS — Z941 Heart transplant status: Secondary | ICD-10-CM | POA: Diagnosis not present

## 2023-08-03 DIAGNOSIS — I610 Nontraumatic intracerebral hemorrhage in hemisphere, subcortical: Secondary | ICD-10-CM | POA: Diagnosis not present

## 2023-08-03 DIAGNOSIS — R509 Fever, unspecified: Secondary | ICD-10-CM | POA: Diagnosis not present

## 2023-08-03 DIAGNOSIS — R569 Unspecified convulsions: Secondary | ICD-10-CM | POA: Diagnosis not present

## 2023-08-03 DIAGNOSIS — J9600 Acute respiratory failure, unspecified whether with hypoxia or hypercapnia: Secondary | ICD-10-CM | POA: Diagnosis not present

## 2023-08-04 DIAGNOSIS — I610 Nontraumatic intracerebral hemorrhage in hemisphere, subcortical: Secondary | ICD-10-CM | POA: Diagnosis not present

## 2023-08-04 DIAGNOSIS — J9600 Acute respiratory failure, unspecified whether with hypoxia or hypercapnia: Secondary | ICD-10-CM | POA: Diagnosis not present

## 2023-08-04 DIAGNOSIS — I615 Nontraumatic intracerebral hemorrhage, intraventricular: Secondary | ICD-10-CM | POA: Diagnosis not present

## 2023-08-04 DIAGNOSIS — J690 Pneumonitis due to inhalation of food and vomit: Secondary | ICD-10-CM | POA: Diagnosis not present

## 2023-08-04 DIAGNOSIS — I619 Nontraumatic intracerebral hemorrhage, unspecified: Secondary | ICD-10-CM | POA: Diagnosis not present

## 2023-08-04 DIAGNOSIS — Z941 Heart transplant status: Secondary | ICD-10-CM | POA: Diagnosis not present

## 2023-08-04 DIAGNOSIS — R569 Unspecified convulsions: Secondary | ICD-10-CM | POA: Diagnosis not present

## 2023-08-04 DIAGNOSIS — R509 Fever, unspecified: Secondary | ICD-10-CM | POA: Diagnosis not present

## 2023-08-04 DIAGNOSIS — G8194 Hemiplegia, unspecified affecting left nondominant side: Secondary | ICD-10-CM | POA: Diagnosis not present

## 2023-08-04 DIAGNOSIS — I10 Essential (primary) hypertension: Secondary | ICD-10-CM | POA: Diagnosis not present

## 2023-08-04 DIAGNOSIS — G936 Cerebral edema: Secondary | ICD-10-CM | POA: Diagnosis not present

## 2023-08-05 DIAGNOSIS — R509 Fever, unspecified: Secondary | ICD-10-CM | POA: Diagnosis not present

## 2023-08-05 DIAGNOSIS — J9600 Acute respiratory failure, unspecified whether with hypoxia or hypercapnia: Secondary | ICD-10-CM | POA: Diagnosis not present

## 2023-08-05 DIAGNOSIS — J690 Pneumonitis due to inhalation of food and vomit: Secondary | ICD-10-CM | POA: Diagnosis not present

## 2023-08-05 DIAGNOSIS — I10 Essential (primary) hypertension: Secondary | ICD-10-CM | POA: Diagnosis not present

## 2023-08-05 DIAGNOSIS — Z941 Heart transplant status: Secondary | ICD-10-CM | POA: Diagnosis not present

## 2023-08-05 DIAGNOSIS — I610 Nontraumatic intracerebral hemorrhage in hemisphere, subcortical: Secondary | ICD-10-CM | POA: Diagnosis not present

## 2023-08-05 DIAGNOSIS — G8194 Hemiplegia, unspecified affecting left nondominant side: Secondary | ICD-10-CM | POA: Diagnosis not present

## 2023-08-05 DIAGNOSIS — I619 Nontraumatic intracerebral hemorrhage, unspecified: Secondary | ICD-10-CM | POA: Diagnosis not present

## 2023-08-05 DIAGNOSIS — G936 Cerebral edema: Secondary | ICD-10-CM | POA: Diagnosis not present

## 2023-08-06 DIAGNOSIS — I615 Nontraumatic intracerebral hemorrhage, intraventricular: Secondary | ICD-10-CM | POA: Diagnosis not present

## 2023-08-06 DIAGNOSIS — R509 Fever, unspecified: Secondary | ICD-10-CM | POA: Diagnosis not present

## 2023-08-06 DIAGNOSIS — G936 Cerebral edema: Secondary | ICD-10-CM | POA: Diagnosis not present

## 2023-08-06 DIAGNOSIS — R569 Unspecified convulsions: Secondary | ICD-10-CM | POA: Diagnosis not present

## 2023-08-06 DIAGNOSIS — G8194 Hemiplegia, unspecified affecting left nondominant side: Secondary | ICD-10-CM | POA: Diagnosis not present

## 2023-08-06 DIAGNOSIS — I619 Nontraumatic intracerebral hemorrhage, unspecified: Secondary | ICD-10-CM | POA: Diagnosis not present

## 2023-08-06 DIAGNOSIS — J690 Pneumonitis due to inhalation of food and vomit: Secondary | ICD-10-CM | POA: Diagnosis not present

## 2023-08-08 DIAGNOSIS — G9389 Other specified disorders of brain: Secondary | ICD-10-CM | POA: Diagnosis not present

## 2023-08-08 DIAGNOSIS — I615 Nontraumatic intracerebral hemorrhage, intraventricular: Secondary | ICD-10-CM | POA: Diagnosis not present

## 2023-08-08 DIAGNOSIS — I610 Nontraumatic intracerebral hemorrhage in hemisphere, subcortical: Secondary | ICD-10-CM | POA: Diagnosis not present

## 2023-08-08 DIAGNOSIS — G936 Cerebral edema: Secondary | ICD-10-CM | POA: Diagnosis not present

## 2023-08-11 DIAGNOSIS — I610 Nontraumatic intracerebral hemorrhage in hemisphere, subcortical: Secondary | ICD-10-CM | POA: Diagnosis not present

## 2023-08-11 DIAGNOSIS — I615 Nontraumatic intracerebral hemorrhage, intraventricular: Secondary | ICD-10-CM | POA: Diagnosis not present

## 2023-08-11 DIAGNOSIS — G8194 Hemiplegia, unspecified affecting left nondominant side: Secondary | ICD-10-CM | POA: Diagnosis not present

## 2023-08-11 DIAGNOSIS — G936 Cerebral edema: Secondary | ICD-10-CM | POA: Diagnosis not present

## 2023-08-14 DIAGNOSIS — I610 Nontraumatic intracerebral hemorrhage in hemisphere, subcortical: Secondary | ICD-10-CM | POA: Diagnosis not present

## 2023-08-14 DIAGNOSIS — J189 Pneumonia, unspecified organism: Secondary | ICD-10-CM | POA: Diagnosis not present

## 2023-08-14 DIAGNOSIS — G936 Cerebral edema: Secondary | ICD-10-CM | POA: Diagnosis not present

## 2023-08-14 DIAGNOSIS — R509 Fever, unspecified: Secondary | ICD-10-CM | POA: Diagnosis not present

## 2023-08-14 DIAGNOSIS — D649 Anemia, unspecified: Secondary | ICD-10-CM | POA: Diagnosis not present

## 2023-08-14 DIAGNOSIS — R569 Unspecified convulsions: Secondary | ICD-10-CM | POA: Diagnosis not present

## 2023-08-15 DIAGNOSIS — G936 Cerebral edema: Secondary | ICD-10-CM | POA: Diagnosis not present

## 2023-08-15 DIAGNOSIS — J189 Pneumonia, unspecified organism: Secondary | ICD-10-CM | POA: Diagnosis not present

## 2023-08-15 DIAGNOSIS — I610 Nontraumatic intracerebral hemorrhage in hemisphere, subcortical: Secondary | ICD-10-CM | POA: Diagnosis not present

## 2023-08-15 DIAGNOSIS — R569 Unspecified convulsions: Secondary | ICD-10-CM | POA: Diagnosis not present

## 2023-08-15 DIAGNOSIS — R509 Fever, unspecified: Secondary | ICD-10-CM | POA: Diagnosis not present

## 2023-08-15 DIAGNOSIS — D649 Anemia, unspecified: Secondary | ICD-10-CM | POA: Diagnosis not present

## 2023-08-16 DIAGNOSIS — R509 Fever, unspecified: Secondary | ICD-10-CM | POA: Diagnosis not present

## 2023-08-16 DIAGNOSIS — I610 Nontraumatic intracerebral hemorrhage in hemisphere, subcortical: Secondary | ICD-10-CM | POA: Diagnosis not present

## 2023-08-16 DIAGNOSIS — G936 Cerebral edema: Secondary | ICD-10-CM | POA: Diagnosis not present

## 2023-08-16 DIAGNOSIS — R569 Unspecified convulsions: Secondary | ICD-10-CM | POA: Diagnosis not present

## 2023-08-16 DIAGNOSIS — J189 Pneumonia, unspecified organism: Secondary | ICD-10-CM | POA: Diagnosis not present

## 2023-08-16 DIAGNOSIS — D649 Anemia, unspecified: Secondary | ICD-10-CM | POA: Diagnosis not present

## 2023-08-17 DIAGNOSIS — I610 Nontraumatic intracerebral hemorrhage in hemisphere, subcortical: Secondary | ICD-10-CM | POA: Diagnosis not present

## 2023-08-17 DIAGNOSIS — D649 Anemia, unspecified: Secondary | ICD-10-CM | POA: Diagnosis not present

## 2023-08-17 DIAGNOSIS — G936 Cerebral edema: Secondary | ICD-10-CM | POA: Diagnosis not present

## 2023-08-17 DIAGNOSIS — I615 Nontraumatic intracerebral hemorrhage, intraventricular: Secondary | ICD-10-CM | POA: Diagnosis not present

## 2023-08-17 DIAGNOSIS — J189 Pneumonia, unspecified organism: Secondary | ICD-10-CM | POA: Diagnosis not present

## 2023-08-17 DIAGNOSIS — R509 Fever, unspecified: Secondary | ICD-10-CM | POA: Diagnosis not present

## 2023-08-17 DIAGNOSIS — I69398 Other sequelae of cerebral infarction: Secondary | ICD-10-CM | POA: Diagnosis not present

## 2023-08-17 DIAGNOSIS — R569 Unspecified convulsions: Secondary | ICD-10-CM | POA: Diagnosis not present

## 2023-08-18 DIAGNOSIS — I615 Nontraumatic intracerebral hemorrhage, intraventricular: Secondary | ICD-10-CM | POA: Diagnosis not present

## 2023-08-18 DIAGNOSIS — G8194 Hemiplegia, unspecified affecting left nondominant side: Secondary | ICD-10-CM | POA: Diagnosis not present

## 2023-08-18 DIAGNOSIS — J189 Pneumonia, unspecified organism: Secondary | ICD-10-CM | POA: Diagnosis not present

## 2023-08-18 DIAGNOSIS — I610 Nontraumatic intracerebral hemorrhage in hemisphere, subcortical: Secondary | ICD-10-CM | POA: Diagnosis not present

## 2023-08-18 DIAGNOSIS — D649 Anemia, unspecified: Secondary | ICD-10-CM | POA: Diagnosis not present

## 2023-08-18 DIAGNOSIS — R509 Fever, unspecified: Secondary | ICD-10-CM | POA: Diagnosis not present

## 2023-08-18 DIAGNOSIS — R569 Unspecified convulsions: Secondary | ICD-10-CM | POA: Diagnosis not present

## 2023-08-18 DIAGNOSIS — G936 Cerebral edema: Secondary | ICD-10-CM | POA: Diagnosis not present

## 2023-08-19 DIAGNOSIS — R509 Fever, unspecified: Secondary | ICD-10-CM | POA: Diagnosis not present

## 2023-08-19 DIAGNOSIS — G936 Cerebral edema: Secondary | ICD-10-CM | POA: Diagnosis not present

## 2023-08-19 DIAGNOSIS — I610 Nontraumatic intracerebral hemorrhage in hemisphere, subcortical: Secondary | ICD-10-CM | POA: Diagnosis not present

## 2023-08-19 DIAGNOSIS — J189 Pneumonia, unspecified organism: Secondary | ICD-10-CM | POA: Diagnosis not present

## 2023-08-19 DIAGNOSIS — D649 Anemia, unspecified: Secondary | ICD-10-CM | POA: Diagnosis not present

## 2023-08-20 DIAGNOSIS — R569 Unspecified convulsions: Secondary | ICD-10-CM | POA: Diagnosis not present

## 2023-08-20 DIAGNOSIS — R509 Fever, unspecified: Secondary | ICD-10-CM | POA: Diagnosis not present

## 2023-08-20 DIAGNOSIS — G936 Cerebral edema: Secondary | ICD-10-CM | POA: Diagnosis not present

## 2023-08-20 DIAGNOSIS — I610 Nontraumatic intracerebral hemorrhage in hemisphere, subcortical: Secondary | ICD-10-CM | POA: Diagnosis not present

## 2023-08-20 DIAGNOSIS — J189 Pneumonia, unspecified organism: Secondary | ICD-10-CM | POA: Diagnosis not present

## 2023-08-21 DIAGNOSIS — J189 Pneumonia, unspecified organism: Secondary | ICD-10-CM | POA: Diagnosis not present

## 2023-08-21 DIAGNOSIS — I610 Nontraumatic intracerebral hemorrhage in hemisphere, subcortical: Secondary | ICD-10-CM | POA: Diagnosis not present

## 2023-08-21 DIAGNOSIS — D649 Anemia, unspecified: Secondary | ICD-10-CM | POA: Diagnosis not present

## 2023-08-21 DIAGNOSIS — R569 Unspecified convulsions: Secondary | ICD-10-CM | POA: Diagnosis not present

## 2023-08-21 DIAGNOSIS — R509 Fever, unspecified: Secondary | ICD-10-CM | POA: Diagnosis not present

## 2023-08-21 DIAGNOSIS — G936 Cerebral edema: Secondary | ICD-10-CM | POA: Diagnosis not present

## 2023-08-22 DIAGNOSIS — R509 Fever, unspecified: Secondary | ICD-10-CM | POA: Diagnosis not present

## 2023-08-22 DIAGNOSIS — J189 Pneumonia, unspecified organism: Secondary | ICD-10-CM | POA: Diagnosis not present

## 2023-08-22 DIAGNOSIS — I610 Nontraumatic intracerebral hemorrhage in hemisphere, subcortical: Secondary | ICD-10-CM | POA: Diagnosis not present

## 2023-08-22 DIAGNOSIS — D649 Anemia, unspecified: Secondary | ICD-10-CM | POA: Diagnosis not present

## 2023-08-22 DIAGNOSIS — G936 Cerebral edema: Secondary | ICD-10-CM | POA: Diagnosis not present

## 2023-08-22 DIAGNOSIS — R569 Unspecified convulsions: Secondary | ICD-10-CM | POA: Diagnosis not present

## 2023-08-23 DIAGNOSIS — G936 Cerebral edema: Secondary | ICD-10-CM | POA: Diagnosis not present

## 2023-08-23 DIAGNOSIS — R509 Fever, unspecified: Secondary | ICD-10-CM | POA: Diagnosis not present

## 2023-08-23 DIAGNOSIS — J189 Pneumonia, unspecified organism: Secondary | ICD-10-CM | POA: Diagnosis not present

## 2023-08-23 DIAGNOSIS — D649 Anemia, unspecified: Secondary | ICD-10-CM | POA: Diagnosis not present

## 2023-08-23 DIAGNOSIS — R569 Unspecified convulsions: Secondary | ICD-10-CM | POA: Diagnosis not present

## 2023-08-23 DIAGNOSIS — I610 Nontraumatic intracerebral hemorrhage in hemisphere, subcortical: Secondary | ICD-10-CM | POA: Diagnosis not present

## 2023-08-24 DIAGNOSIS — R569 Unspecified convulsions: Secondary | ICD-10-CM | POA: Diagnosis not present

## 2023-08-24 DIAGNOSIS — J189 Pneumonia, unspecified organism: Secondary | ICD-10-CM | POA: Diagnosis not present

## 2023-08-24 DIAGNOSIS — I610 Nontraumatic intracerebral hemorrhage in hemisphere, subcortical: Secondary | ICD-10-CM | POA: Diagnosis not present

## 2023-08-24 DIAGNOSIS — G936 Cerebral edema: Secondary | ICD-10-CM | POA: Diagnosis not present

## 2023-08-24 DIAGNOSIS — D649 Anemia, unspecified: Secondary | ICD-10-CM | POA: Diagnosis not present

## 2023-08-24 DIAGNOSIS — R509 Fever, unspecified: Secondary | ICD-10-CM | POA: Diagnosis not present

## 2023-08-25 DIAGNOSIS — J189 Pneumonia, unspecified organism: Secondary | ICD-10-CM | POA: Diagnosis not present

## 2023-08-25 DIAGNOSIS — D649 Anemia, unspecified: Secondary | ICD-10-CM | POA: Diagnosis not present

## 2023-08-25 DIAGNOSIS — R569 Unspecified convulsions: Secondary | ICD-10-CM | POA: Diagnosis not present

## 2023-08-25 DIAGNOSIS — I610 Nontraumatic intracerebral hemorrhage in hemisphere, subcortical: Secondary | ICD-10-CM | POA: Diagnosis not present

## 2023-08-25 DIAGNOSIS — R509 Fever, unspecified: Secondary | ICD-10-CM | POA: Diagnosis not present

## 2023-08-25 DIAGNOSIS — G936 Cerebral edema: Secondary | ICD-10-CM | POA: Diagnosis not present

## 2023-08-26 DIAGNOSIS — J189 Pneumonia, unspecified organism: Secondary | ICD-10-CM | POA: Diagnosis not present

## 2023-08-26 DIAGNOSIS — G936 Cerebral edema: Secondary | ICD-10-CM | POA: Diagnosis not present

## 2023-08-26 DIAGNOSIS — R569 Unspecified convulsions: Secondary | ICD-10-CM | POA: Diagnosis not present

## 2023-08-26 DIAGNOSIS — I610 Nontraumatic intracerebral hemorrhage in hemisphere, subcortical: Secondary | ICD-10-CM | POA: Diagnosis not present

## 2023-08-26 DIAGNOSIS — R509 Fever, unspecified: Secondary | ICD-10-CM | POA: Diagnosis not present

## 2023-08-26 DIAGNOSIS — D649 Anemia, unspecified: Secondary | ICD-10-CM | POA: Diagnosis not present

## 2023-08-27 DIAGNOSIS — J189 Pneumonia, unspecified organism: Secondary | ICD-10-CM | POA: Diagnosis not present

## 2023-08-27 DIAGNOSIS — R509 Fever, unspecified: Secondary | ICD-10-CM | POA: Diagnosis not present

## 2023-08-27 DIAGNOSIS — I610 Nontraumatic intracerebral hemorrhage in hemisphere, subcortical: Secondary | ICD-10-CM | POA: Diagnosis not present

## 2023-08-27 DIAGNOSIS — D649 Anemia, unspecified: Secondary | ICD-10-CM | POA: Diagnosis not present

## 2023-08-27 DIAGNOSIS — G936 Cerebral edema: Secondary | ICD-10-CM | POA: Diagnosis not present

## 2023-08-27 DIAGNOSIS — R569 Unspecified convulsions: Secondary | ICD-10-CM | POA: Diagnosis not present

## 2023-08-28 DIAGNOSIS — R569 Unspecified convulsions: Secondary | ICD-10-CM | POA: Diagnosis not present

## 2023-08-28 DIAGNOSIS — R509 Fever, unspecified: Secondary | ICD-10-CM | POA: Diagnosis not present

## 2023-08-28 DIAGNOSIS — J189 Pneumonia, unspecified organism: Secondary | ICD-10-CM | POA: Diagnosis not present

## 2023-08-28 DIAGNOSIS — I610 Nontraumatic intracerebral hemorrhage in hemisphere, subcortical: Secondary | ICD-10-CM | POA: Diagnosis not present

## 2023-08-28 DIAGNOSIS — D649 Anemia, unspecified: Secondary | ICD-10-CM | POA: Diagnosis not present

## 2023-08-28 DIAGNOSIS — G936 Cerebral edema: Secondary | ICD-10-CM | POA: Diagnosis not present

## 2023-09-02 DIAGNOSIS — I619 Nontraumatic intracerebral hemorrhage, unspecified: Secondary | ICD-10-CM | POA: Diagnosis not present

## 2023-09-03 DIAGNOSIS — I619 Nontraumatic intracerebral hemorrhage, unspecified: Secondary | ICD-10-CM | POA: Diagnosis not present

## 2023-09-04 DIAGNOSIS — I619 Nontraumatic intracerebral hemorrhage, unspecified: Secondary | ICD-10-CM | POA: Diagnosis not present

## 2023-09-05 DIAGNOSIS — I619 Nontraumatic intracerebral hemorrhage, unspecified: Secondary | ICD-10-CM | POA: Diagnosis not present

## 2023-09-08 DIAGNOSIS — I619 Nontraumatic intracerebral hemorrhage, unspecified: Secondary | ICD-10-CM | POA: Diagnosis not present

## 2023-09-09 DIAGNOSIS — I619 Nontraumatic intracerebral hemorrhage, unspecified: Secondary | ICD-10-CM | POA: Diagnosis not present

## 2023-09-10 DIAGNOSIS — I619 Nontraumatic intracerebral hemorrhage, unspecified: Secondary | ICD-10-CM | POA: Diagnosis not present

## 2023-09-16 DIAGNOSIS — I619 Nontraumatic intracerebral hemorrhage, unspecified: Secondary | ICD-10-CM | POA: Diagnosis not present

## 2023-09-18 DIAGNOSIS — I619 Nontraumatic intracerebral hemorrhage, unspecified: Secondary | ICD-10-CM | POA: Diagnosis not present

## 2023-09-18 DIAGNOSIS — I69198 Other sequelae of nontraumatic intracerebral hemorrhage: Secondary | ICD-10-CM | POA: Diagnosis not present

## 2023-09-18 DIAGNOSIS — R261 Paralytic gait: Secondary | ICD-10-CM | POA: Diagnosis not present

## 2023-09-19 ENCOUNTER — Other Ambulatory Visit: Payer: Self-pay | Admitting: Internal Medicine

## 2023-09-19 ENCOUNTER — Telehealth: Payer: Self-pay

## 2023-09-19 ENCOUNTER — Other Ambulatory Visit (HOSPITAL_COMMUNITY): Payer: Self-pay

## 2023-09-19 NOTE — Telephone Encounter (Signed)
*  Endo  Pharmacy Patient Advocate Encounter   Received notification from CoverMyMeds that prior authorization for FreeStyle Libre 2 Plus Sensor  is required/requested.   Insurance verification completed.   The patient is insured through [facility] .   Per test claim: PA required; However, NEW/RECENT labs/notes are needed to complete & submit PA request. Please see below.   CMM Key: [id number]

## 2023-09-26 ENCOUNTER — Telehealth: Payer: Self-pay | Admitting: Internal Medicine

## 2023-09-26 NOTE — Telephone Encounter (Signed)
 Copied from CRM [id number]. Topic: Referral - Question >> Sep 26, 2023 12:23 PM Jason Stevenson wrote:  Reason for CRM: Spouse called.. Inquiring for personal help at home.. Can she get a referral

## 2023-09-26 NOTE — Telephone Encounter (Signed)
 Copied from CRM [id number]. Topic: Clinical - Home Health Verbal Orders >> Sep 26, 2023 11:02 AM Zipporah Him wrote:  Caller/Agency: [facility] Callback Number: [phone], ok to secure voicemail Service Requested: Physical Therapy Frequency: 1w1, 2w8 Occ Therapy Evaluation Any new concerns about the patient? Yes, patient needs a hemiwalker ordered. Nurse advised patient has moved and office will need to contact him to get updated address for sending equipment. Address on file is still the old address.

## 2023-09-29 NOTE — Telephone Encounter (Signed)
 Copied from CRM [id number]. Topic: Clinical - Medical Advice >> Sep 29, 2023  2:04 PM Sasha H wrote:  Reason for CRM: Pt's spouse is wanting to also know when will they hear back about the referral for personal care services.

## 2023-09-29 NOTE — Telephone Encounter (Signed)
 Copied from CRM [id number]. Topic: Referral - Question >> Sep 26, 2023 12:23 PM Emylou G wrote:  Reason for CRM: Spouse called.. Inquiring for personal help at home.. Can she get a referral  >> Sep 29, 2023 12:29 PM Alica Antu wrote: Home health [facility] called to check status on orders.   May you please assist.

## 2023-09-30 ENCOUNTER — Emergency Department (HOSPITAL_COMMUNITY)

## 2023-09-30 ENCOUNTER — Other Ambulatory Visit: Payer: Self-pay

## 2023-09-30 ENCOUNTER — Emergency Department (HOSPITAL_COMMUNITY)
Admission: EM | Admit: 2023-09-30 | Discharge: 2023-09-30 | Disposition: A | Discharge status: Other Acute Inpatient Hospital | Attending: Emergency Medicine | Admitting: Emergency Medicine

## 2023-09-30 DIAGNOSIS — Z09 Encounter for follow-up examination after completed treatment for conditions other than malignant neoplasm: Secondary | ICD-10-CM | POA: Diagnosis not present

## 2023-09-30 DIAGNOSIS — F39 Unspecified mood [affective] disorder: Secondary | ICD-10-CM | POA: Diagnosis not present

## 2023-09-30 DIAGNOSIS — Z4682 Encounter for fitting and adjustment of non-vascular catheter: Secondary | ICD-10-CM | POA: Diagnosis not present

## 2023-09-30 DIAGNOSIS — Z452 Encounter for adjustment and management of vascular access device: Secondary | ICD-10-CM | POA: Diagnosis not present

## 2023-09-30 DIAGNOSIS — R739 Hyperglycemia, unspecified: Secondary | ICD-10-CM | POA: Diagnosis not present

## 2023-09-30 DIAGNOSIS — Z941 Heart transplant status: Secondary | ICD-10-CM | POA: Diagnosis not present

## 2023-09-30 DIAGNOSIS — Z7401 Bed confinement status: Secondary | ICD-10-CM | POA: Diagnosis not present

## 2023-09-30 DIAGNOSIS — R509 Fever, unspecified: Secondary | ICD-10-CM | POA: Diagnosis not present

## 2023-09-30 DIAGNOSIS — T8622 Heart transplant failure: Secondary | ICD-10-CM | POA: Diagnosis not present

## 2023-09-30 DIAGNOSIS — R258 Other abnormal involuntary movements: Secondary | ICD-10-CM | POA: Diagnosis not present

## 2023-09-30 DIAGNOSIS — J90 Pleural effusion, not elsewhere classified: Secondary | ICD-10-CM | POA: Diagnosis not present

## 2023-09-30 DIAGNOSIS — R579 Shock, unspecified: Secondary | ICD-10-CM | POA: Diagnosis not present

## 2023-09-30 DIAGNOSIS — R000 Tachycardia, unspecified: Secondary | ICD-10-CM | POA: Diagnosis not present

## 2023-09-30 DIAGNOSIS — Z8674 Personal history of sudden cardiac arrest: Secondary | ICD-10-CM | POA: Diagnosis not present

## 2023-09-30 DIAGNOSIS — R6521 Severe sepsis with septic shock: Secondary | ICD-10-CM | POA: Insufficient documentation

## 2023-09-30 DIAGNOSIS — N179 Acute kidney failure, unspecified: Secondary | ICD-10-CM | POA: Diagnosis not present

## 2023-09-30 DIAGNOSIS — R251 Tremor, unspecified: Secondary | ICD-10-CM | POA: Diagnosis not present

## 2023-09-30 DIAGNOSIS — D7589 Other specified diseases of blood and blood-forming organs: Secondary | ICD-10-CM | POA: Diagnosis not present

## 2023-09-30 DIAGNOSIS — I371 Nonrheumatic pulmonary valve insufficiency: Secondary | ICD-10-CM | POA: Diagnosis not present

## 2023-09-30 DIAGNOSIS — A419 Sepsis, unspecified organism: Secondary | ICD-10-CM | POA: Diagnosis not present

## 2023-09-30 DIAGNOSIS — R195 Other fecal abnormalities: Secondary | ICD-10-CM | POA: Diagnosis not present

## 2023-09-30 DIAGNOSIS — E1165 Type 2 diabetes mellitus with hyperglycemia: Secondary | ICD-10-CM | POA: Diagnosis not present

## 2023-09-30 DIAGNOSIS — A498 Other bacterial infections of unspecified site: Secondary | ICD-10-CM | POA: Diagnosis not present

## 2023-09-30 DIAGNOSIS — I69354 Hemiplegia and hemiparesis following cerebral infarction affecting left non-dominant side: Secondary | ICD-10-CM | POA: Diagnosis not present

## 2023-09-30 DIAGNOSIS — K6389 Other specified diseases of intestine: Secondary | ICD-10-CM | POA: Diagnosis not present

## 2023-09-30 DIAGNOSIS — Z4659 Encounter for fitting and adjustment of other gastrointestinal appliance and device: Secondary | ICD-10-CM | POA: Diagnosis not present

## 2023-09-30 DIAGNOSIS — J811 Chronic pulmonary edema: Secondary | ICD-10-CM | POA: Diagnosis not present

## 2023-09-30 DIAGNOSIS — I959 Hypotension, unspecified: Secondary | ICD-10-CM | POA: Diagnosis not present

## 2023-09-30 DIAGNOSIS — E870 Hyperosmolality and hypernatremia: Secondary | ICD-10-CM | POA: Diagnosis not present

## 2023-09-30 DIAGNOSIS — R7989 Other specified abnormal findings of blood chemistry: Secondary | ICD-10-CM

## 2023-09-30 DIAGNOSIS — E875 Hyperkalemia: Secondary | ICD-10-CM | POA: Insufficient documentation

## 2023-09-30 DIAGNOSIS — E1122 Type 2 diabetes mellitus with diabetic chronic kidney disease: Secondary | ICD-10-CM | POA: Diagnosis not present

## 2023-09-30 DIAGNOSIS — R5383 Other fatigue: Secondary | ICD-10-CM | POA: Diagnosis present

## 2023-09-30 DIAGNOSIS — R7401 Elevation of levels of liver transaminase levels: Secondary | ICD-10-CM | POA: Insufficient documentation

## 2023-09-30 DIAGNOSIS — I129 Hypertensive chronic kidney disease with stage 1 through stage 4 chronic kidney disease, or unspecified chronic kidney disease: Secondary | ICD-10-CM | POA: Diagnosis not present

## 2023-09-30 DIAGNOSIS — E139 Other specified diabetes mellitus without complications: Secondary | ICD-10-CM | POA: Diagnosis not present

## 2023-09-30 DIAGNOSIS — D849 Immunodeficiency, unspecified: Secondary | ICD-10-CM | POA: Diagnosis not present

## 2023-09-30 DIAGNOSIS — I110 Hypertensive heart disease with heart failure: Secondary | ICD-10-CM | POA: Diagnosis not present

## 2023-09-30 DIAGNOSIS — R261 Paralytic gait: Secondary | ICD-10-CM | POA: Diagnosis not present

## 2023-09-30 DIAGNOSIS — D689 Coagulation defect, unspecified: Secondary | ICD-10-CM | POA: Diagnosis not present

## 2023-09-30 DIAGNOSIS — R570 Cardiogenic shock: Secondary | ICD-10-CM | POA: Diagnosis not present

## 2023-09-30 DIAGNOSIS — Z9281 Personal history of extracorporeal membrane oxygenation (ECMO): Secondary | ICD-10-CM | POA: Diagnosis not present

## 2023-09-30 DIAGNOSIS — I6930 Unspecified sequelae of cerebral infarction: Secondary | ICD-10-CM | POA: Diagnosis not present

## 2023-09-30 DIAGNOSIS — J948 Other specified pleural conditions: Secondary | ICD-10-CM | POA: Diagnosis not present

## 2023-09-30 DIAGNOSIS — K7200 Acute and subacute hepatic failure without coma: Secondary | ICD-10-CM | POA: Diagnosis not present

## 2023-09-30 DIAGNOSIS — Z8673 Personal history of transient ischemic attack (TIA), and cerebral infarction without residual deficits: Secondary | ICD-10-CM | POA: Diagnosis not present

## 2023-09-30 DIAGNOSIS — I10 Essential (primary) hypertension: Secondary | ICD-10-CM | POA: Diagnosis not present

## 2023-09-30 DIAGNOSIS — R590 Localized enlarged lymph nodes: Secondary | ICD-10-CM | POA: Diagnosis not present

## 2023-09-30 DIAGNOSIS — B259 Cytomegaloviral disease, unspecified: Secondary | ICD-10-CM | POA: Diagnosis not present

## 2023-09-30 DIAGNOSIS — I69198 Other sequelae of nontraumatic intracerebral hemorrhage: Secondary | ICD-10-CM | POA: Diagnosis not present

## 2023-09-30 DIAGNOSIS — R531 Weakness: Secondary | ICD-10-CM | POA: Diagnosis not present

## 2023-09-30 DIAGNOSIS — J9600 Acute respiratory failure, unspecified whether with hypoxia or hypercapnia: Secondary | ICD-10-CM | POA: Diagnosis not present

## 2023-09-30 DIAGNOSIS — E1365 Other specified diabetes mellitus with hyperglycemia: Secondary | ICD-10-CM | POA: Diagnosis not present

## 2023-09-30 DIAGNOSIS — I509 Heart failure, unspecified: Secondary | ICD-10-CM | POA: Insufficient documentation

## 2023-09-30 DIAGNOSIS — R931 Abnormal findings on diagnostic imaging of heart and coronary circulation: Secondary | ICD-10-CM | POA: Diagnosis not present

## 2023-09-30 DIAGNOSIS — J9690 Respiratory failure, unspecified, unspecified whether with hypoxia or hypercapnia: Secondary | ICD-10-CM | POA: Diagnosis not present

## 2023-09-30 DIAGNOSIS — E1065 Type 1 diabetes mellitus with hyperglycemia: Secondary | ICD-10-CM | POA: Diagnosis not present

## 2023-09-30 DIAGNOSIS — G9341 Metabolic encephalopathy: Secondary | ICD-10-CM | POA: Diagnosis not present

## 2023-09-30 DIAGNOSIS — J810 Acute pulmonary edema: Secondary | ICD-10-CM | POA: Diagnosis not present

## 2023-09-30 DIAGNOSIS — I469 Cardiac arrest, cause unspecified: Secondary | ICD-10-CM | POA: Diagnosis not present

## 2023-09-30 DIAGNOSIS — Z7982 Long term (current) use of aspirin: Secondary | ICD-10-CM | POA: Insufficient documentation

## 2023-09-30 DIAGNOSIS — R110 Nausea: Secondary | ICD-10-CM | POA: Diagnosis not present

## 2023-09-30 DIAGNOSIS — N189 Chronic kidney disease, unspecified: Secondary | ICD-10-CM | POA: Diagnosis not present

## 2023-09-30 DIAGNOSIS — R569 Unspecified convulsions: Secondary | ICD-10-CM | POA: Diagnosis not present

## 2023-09-30 DIAGNOSIS — R1312 Dysphagia, oropharyngeal phase: Secondary | ICD-10-CM | POA: Diagnosis not present

## 2023-09-30 DIAGNOSIS — Z9911 Dependence on respirator [ventilator] status: Secondary | ICD-10-CM | POA: Diagnosis not present

## 2023-09-30 DIAGNOSIS — R197 Diarrhea, unspecified: Secondary | ICD-10-CM | POA: Diagnosis not present

## 2023-09-30 DIAGNOSIS — E872 Acidosis, unspecified: Secondary | ICD-10-CM | POA: Diagnosis not present

## 2023-09-30 DIAGNOSIS — Z136 Encounter for screening for cardiovascular disorders: Secondary | ICD-10-CM | POA: Diagnosis not present

## 2023-09-30 DIAGNOSIS — G40909 Epilepsy, unspecified, not intractable, without status epilepticus: Secondary | ICD-10-CM | POA: Diagnosis not present

## 2023-09-30 DIAGNOSIS — D62 Acute posthemorrhagic anemia: Secondary | ICD-10-CM | POA: Diagnosis not present

## 2023-09-30 DIAGNOSIS — G8918 Other acute postprocedural pain: Secondary | ICD-10-CM | POA: Diagnosis not present

## 2023-09-30 DIAGNOSIS — Z930 Tracheostomy status: Secondary | ICD-10-CM | POA: Diagnosis not present

## 2023-09-30 DIAGNOSIS — T380X5A Adverse effect of glucocorticoids and synthetic analogues, initial encounter: Secondary | ICD-10-CM | POA: Diagnosis not present

## 2023-09-30 DIAGNOSIS — R9389 Abnormal findings on diagnostic imaging of other specified body structures: Secondary | ICD-10-CM | POA: Diagnosis not present

## 2023-09-30 DIAGNOSIS — A4189 Other specified sepsis: Secondary | ICD-10-CM | POA: Diagnosis not present

## 2023-09-30 DIAGNOSIS — Z0389 Encounter for observation for other suspected diseases and conditions ruled out: Secondary | ICD-10-CM | POA: Diagnosis not present

## 2023-09-30 DIAGNOSIS — R140 Abdominal distension (gaseous): Secondary | ICD-10-CM | POA: Diagnosis not present

## 2023-09-30 DIAGNOSIS — E7800 Pure hypercholesterolemia, unspecified: Secondary | ICD-10-CM | POA: Diagnosis not present

## 2023-09-30 DIAGNOSIS — T86298 Other complications of heart transplant: Secondary | ICD-10-CM | POA: Diagnosis not present

## 2023-09-30 DIAGNOSIS — J9602 Acute respiratory failure with hypercapnia: Secondary | ICD-10-CM | POA: Diagnosis not present

## 2023-09-30 DIAGNOSIS — T8621 Heart transplant rejection: Secondary | ICD-10-CM | POA: Diagnosis not present

## 2023-09-30 DIAGNOSIS — R5081 Fever presenting with conditions classified elsewhere: Secondary | ICD-10-CM | POA: Diagnosis not present

## 2023-09-30 DIAGNOSIS — E119 Type 2 diabetes mellitus without complications: Secondary | ICD-10-CM | POA: Diagnosis not present

## 2023-09-30 DIAGNOSIS — I82611 Acute embolism and thrombosis of superficial veins of right upper extremity: Secondary | ICD-10-CM | POA: Diagnosis not present

## 2023-09-30 DIAGNOSIS — J9601 Acute respiratory failure with hypoxia: Secondary | ICD-10-CM | POA: Diagnosis not present

## 2023-09-30 DIAGNOSIS — K551 Chronic vascular disorders of intestine: Secondary | ICD-10-CM | POA: Diagnosis not present

## 2023-09-30 DIAGNOSIS — Z794 Long term (current) use of insulin: Secondary | ICD-10-CM | POA: Diagnosis not present

## 2023-09-30 DIAGNOSIS — D84821 Immunodeficiency due to drugs: Secondary | ICD-10-CM | POA: Diagnosis not present

## 2023-09-30 DIAGNOSIS — R29818 Other symptoms and signs involving the nervous system: Secondary | ICD-10-CM | POA: Diagnosis not present

## 2023-09-30 DIAGNOSIS — G9340 Encephalopathy, unspecified: Secondary | ICD-10-CM | POA: Diagnosis not present

## 2023-09-30 DIAGNOSIS — R918 Other nonspecific abnormal finding of lung field: Secondary | ICD-10-CM | POA: Diagnosis not present

## 2023-09-30 DIAGNOSIS — I639 Cerebral infarction, unspecified: Secondary | ICD-10-CM | POA: Diagnosis not present

## 2023-09-30 DIAGNOSIS — J9621 Acute and chronic respiratory failure with hypoxia: Secondary | ICD-10-CM | POA: Diagnosis not present

## 2023-09-30 DIAGNOSIS — B258 Other cytomegaloviral diseases: Secondary | ICD-10-CM | POA: Diagnosis not present

## 2023-09-30 DIAGNOSIS — D899 Disorder involving the immune mechanism, unspecified: Secondary | ICD-10-CM | POA: Diagnosis not present

## 2023-09-30 DIAGNOSIS — J150 Pneumonia due to Klebsiella pneumoniae: Secondary | ICD-10-CM | POA: Diagnosis not present

## 2023-09-30 DIAGNOSIS — J939 Pneumothorax, unspecified: Secondary | ICD-10-CM | POA: Diagnosis not present

## 2023-09-30 DIAGNOSIS — I517 Cardiomegaly: Secondary | ICD-10-CM | POA: Diagnosis not present

## 2023-09-30 DIAGNOSIS — J95851 Ventilator associated pneumonia: Secondary | ICD-10-CM | POA: Diagnosis not present

## 2023-09-30 DIAGNOSIS — I468 Cardiac arrest due to other underlying condition: Secondary | ICD-10-CM | POA: Diagnosis not present

## 2023-09-30 DIAGNOSIS — R4182 Altered mental status, unspecified: Secondary | ICD-10-CM | POA: Diagnosis not present

## 2023-09-30 LAB — I-STAT CG4 LACTIC ACID, ED: Lactic Acid, Venous: 2.3 mmol/L (ref 0.5–1.9)

## 2023-09-30 LAB — COMPREHENSIVE METABOLIC PANEL WITH GFR
ALT: 130 U/L — ABNORMAL HIGH (ref 0–44)
AST: 95 U/L — ABNORMAL HIGH (ref 15–41)
Albumin: 2.9 g/dL — ABNORMAL LOW (ref 3.5–5.0)
Alkaline Phosphatase: 142 U/L — ABNORMAL HIGH (ref 38–126)
Anion gap: 13 (ref 5–15)
BUN: 38 mg/dL — ABNORMAL HIGH (ref 6–20)
CO2: 21 mmol/L — ABNORMAL LOW (ref 22–32)
Calcium: 9.1 mg/dL (ref 8.9–10.3)
Chloride: 101 mmol/L (ref 98–111)
Creatinine, Ser: 2.15 mg/dL — ABNORMAL HIGH (ref 0.61–1.24)
GFR, Estimated: 39 mL/min — ABNORMAL LOW (ref >60)
Glucose, Bld: 237 mg/dL — ABNORMAL HIGH (ref 70–99)
Potassium: 5.5 mmol/L — ABNORMAL HIGH (ref 3.5–5.1)
Sodium: 135 mmol/L (ref 135–145)
Total Bilirubin: 0.8 mg/dL (ref 0.0–1.2)
Total Protein: 6.5 g/dL (ref 6.5–8.1)

## 2023-09-30 LAB — I-STAT CHEM 8, ED
BUN: 38 mg/dL — ABNORMAL HIGH (ref 6–20)
Calcium, Ion: 1.15 mmol/L (ref 1.15–1.40)
Chloride: 103 mmol/L (ref 98–111)
Creatinine, Ser: 2.2 mg/dL — ABNORMAL HIGH (ref 0.61–1.24)
Glucose, Bld: 233 mg/dL — ABNORMAL HIGH (ref 70–99)
HCT: 41 % (ref 39.0–52.0)
Hemoglobin: 13.9 g/dL (ref 13.0–17.0)
Potassium: 5 mmol/L (ref 3.5–5.1)
Sodium: 136 mmol/L (ref 135–145)
TCO2: 21 mmol/L — ABNORMAL LOW (ref 22–32)

## 2023-09-30 LAB — CBC WITH DIFFERENTIAL/PLATELET
Abs Immature Granulocytes: 0 10*3/uL (ref 0.00–0.07)
Basophils Absolute: 0.4 10*3/uL — ABNORMAL HIGH (ref 0.0–0.1)
Basophils Relative: 3 %
Eosinophils Absolute: 0.1 10*3/uL (ref 0.0–0.5)
Eosinophils Relative: 1 %
HCT: 40.3 % (ref 39.0–52.0)
Hemoglobin: 13.1 g/dL (ref 13.0–17.0)
Lymphocytes Relative: 44 %
Lymphs Abs: 5.9 10*3/uL — ABNORMAL HIGH (ref 0.7–4.0)
MCH: 28.7 pg (ref 26.0–34.0)
MCHC: 32.5 g/dL (ref 30.0–36.0)
MCV: 88.2 fL (ref 80.0–100.0)
Monocytes Absolute: 0.4 10*3/uL (ref 0.1–1.0)
Monocytes Relative: 3 %
Neutro Abs: 6.5 10*3/uL (ref 1.7–7.7)
Neutrophils Relative %: 49 %
Platelets: 199 10*3/uL (ref 150–400)
RBC: 4.57 MIL/uL (ref 4.22–5.81)
RDW: 14.5 % (ref 11.5–15.5)
WBC: 13.3 10*3/uL — ABNORMAL HIGH (ref 4.0–10.5)
nRBC: 0 % (ref 0.0–0.2)
nRBC: 0 /100{WBCs}

## 2023-09-30 LAB — CK: Total CK: 105 U/L (ref 49–397)

## 2023-09-30 LAB — TSH: TSH: 1.037 u[IU]/mL (ref 0.350–4.500)

## 2023-09-30 LAB — MAGNESIUM: Magnesium: 2 mg/dL (ref 1.7–2.4)

## 2023-09-30 LAB — PROTIME-INR
INR: 1.1 (ref 0.8–1.2)
Prothrombin Time: 13.9 s (ref 11.4–15.2)

## 2023-09-30 LAB — LIPASE, BLOOD: Lipase: 18 U/L (ref 11–51)

## 2023-09-30 MED ORDER — METRONIDAZOLE 500 MG/100ML IV SOLN
500.0000 mg | Freq: Once | INTRAVENOUS | Status: AC
  Administered 2023-09-30: 500 mg via INTRAVENOUS
  Filled 2023-09-30: qty 100

## 2023-09-30 MED ORDER — SODIUM CHLORIDE 0.9 % IV SOLN
2.0000 g | Freq: Once | INTRAVENOUS | Status: AC
  Administered 2023-09-30: 2 g via INTRAVENOUS
  Filled 2023-09-30: qty 12.5

## 2023-09-30 MED ORDER — ACETAMINOPHEN 500 MG PO TABS
1000.0000 mg | ORAL_TABLET | Freq: Once | ORAL | Status: AC
  Administered 2023-09-30: 1000 mg via ORAL
  Filled 2023-09-30: qty 2

## 2023-09-30 MED ORDER — LACTATED RINGERS IV BOLUS (SEPSIS)
1000.0000 mL | Freq: Once | INTRAVENOUS | Status: AC
  Administered 2023-09-30: 1000 mL via INTRAVENOUS

## 2023-09-30 MED ORDER — VANCOMYCIN HCL IN DEXTROSE 1-5 GM/200ML-% IV SOLN: 1000.0000 mg | Freq: Once | INTRAVENOUS | Status: DC

## 2023-09-30 MED ORDER — VANCOMYCIN HCL 2000 MG/400ML IV SOLN
2000.0000 mg | Freq: Once | INTRAVENOUS | Status: AC
  Administered 2023-09-30: 2000 mg via INTRAVENOUS
  Filled 2023-09-30: qty 400

## 2023-09-30 MED ORDER — LACTATED RINGERS IV SOLN: INTRAVENOUS | Status: DC

## 2023-09-30 NOTE — ED Triage Notes (Signed)
 Pt bibems from home. Hx of heart transplant in Aug 2024, hemorrhagic stroke July 25, 2023. Left side flaccid since stroke.  Lethargic started yesterday, worst today 82 systolic, HR around 140. Denies any pain.

## 2023-09-30 NOTE — Sepsis Progress Note (Signed)
 Elink will follow per sepsis protocol.

## 2023-09-30 NOTE — ED Provider Notes (Cosign Needed Addendum)
 [place] EMERGENCY DEPARTMENT AT [facility] Provider Note   CSN: [id number] Arrival date & time: 09/30/23  1734    Patient presents with: Fatigue.   Jason Stevenson is a 38 y.o. male complex medical history, CHF with heart transplant 2024 followed by [facility], CVA subsequent hemorrhage, left side deficits at baseline, seizure disorder here for evaluation of fatigue.  Wife states patient has been fatigue lethargic was started yesterday.  Has noted progressively decreasing blood pressures, 80s systolic with EMS.  Wife and patient states that he does have diarrhea however this is intermittent with that was likely due to magnesium  supplementation which he is chronically low on mag.  No blood in the stool.  No cough, shortness of breath, chest pain, abdominal pain, nausea or vomiting.  No headache, new weakness.  Patient states he just feels weak and fatigued.  No new medication changes.  Compliant with meds.  Does not walk at baseline per EMS     Echo 07/01/23: EF 55%, mild RV dysfunction     HPI     Prior to Admission medications   Medication Sig Start Date End Date Taking? Authorizing Provider  ACCU-CHEK GUIDE test strip USE TO CHECK BLOOD SUGAR THREE TIMES DAILY. 07/03/22   Vicci Barnie NOVAK, MD  acetaminophen  (TYLENOL ) 325 MG tablet Take 1-2 tablets (325-650 mg total) by mouth every 4 (four) hours as needed for mild pain. 08/28/21   Lesia Ozell Barter, PA-C  amLODipine (NORVASC) 5 MG tablet Take 5 mg by mouth daily.    [provider]  aspirin  EC 81 MG tablet Take 81 mg by mouth daily. Swallow whole.    [provider]  Blood Glucose Monitoring Suppl (ACCU-CHEK GUIDE ME) w/Device KIT USE TO CHECK BLOOD SUGAR THREE TIMES DAILY. 07/04/22   Vicci Barnie NOVAK, MD  Blood Pressure Monitor DEVI Use as directed to check home blood pressure 2-3 times a week 10/12/18   Vicci Barnie NOVAK, MD  Calcium  Carbonate-Vitamin D (CALTRATE 600+D PO) Take 600 mg by mouth in  the morning and at bedtime.    [provider]  Continuous Glucose Sensor (FREESTYLE LIBRE 2 SENSOR) MISC UAD 04/16/23   Vicci Barnie NOVAK, MD  DULoxetine  (CYMBALTA ) 60 MG capsule Take 60 mg by mouth daily. 02/25/23   [provider]  EASY COMFORT PEN NEEDLES 32G X 4 MM MISC USE TO INJECT INSULIN  FOUR TIMES A DAY 09/22/23   Vicci Barnie NOVAK, MD  ezetimibe  (ZETIA ) 10 MG tablet Take 10 mg by mouth daily.    [provider]  furosemide  (LASIX ) 20 MG tablet TAKE 1 TABLET (20 MG TOTAL) BY MOUTH 3 (THREE) TIMES A WEEK. MONDAYS WEDNESDAYS AND FRIDAY Patient taking differently: Take 20 mg by mouth daily as needed for edema. 11/26/22   [place], Harlene HERO, FNP  GLUCAGON NA Place 3 sprays into the nose once as needed.    [provider]  insulin  glargine (LANTUS ) 100 UNIT/ML injection Inject 28 Units into the skin daily.    [provider]  insulin  lispro (HUMALOG  KWIKPEN) 100 UNIT/ML KwikPen Inject 14-16 Units into the skin. 14 am 16 noon and pm    [provider]  Insulin  Syringe-Needle U-100 (RELION INSULIN  SYRINGE) 31G X 15/64 0.3 ML MISC Use to inject insulin  daily. 07/13/18   Vicci Barnie NOVAK, MD  levETIRAcetam  (KEPPRA ) 750 MG tablet TAKE 2 TABLETS BY MOUTH 2 (TWO) TIMES DAILY (2AM+ 2EVENING) 11/28/22   Penumalli, Eduard SAUNDERS, MD  magnesium  oxide (MAG-OX) 400 (  240 Mg) MG tablet Take 1 tablet by mouth 2 (two) times daily. Patient not taking: Reported on 06/29/2023 05/29/23   [provider]  mycophenolate (CELLCEPT ) 250 MG capsule Take 750 mg by mouth every 12 (twelve) hours.    [provider]  Oxcarbazepine  (TRILEPTAL ) 300 MG tablet TAKE 2 TABLETS (600 MG TOTAL) BY MOUTH 2 (TWO) TIMES DAILY (AM+EVENING) 11/28/22   Penumalli, Eduard SAUNDERS, MD  pantoprazole  (PROTONIX ) 40 MG tablet Take 40 mg by mouth daily.    [provider]  predniSONE (DELTASONE) 5 MG tablet Take 5 mg by mouth daily with breakfast. Take 1 tablets once a day     [provider]  pregabalin  (LYRICA ) 150 MG capsule Take 150 mg by mouth at bedtime. 06/04/23   [provider]  propranolol (INDERAL) 10 MG tablet Take 10 mg by mouth 2 (two) times daily.    [provider]  rosuvastatin  (CRESTOR ) 10 MG tablet Take 20 mg by mouth daily.    [provider]  senna-docusate (SENOKOT-S) 8.6-50 MG tablet Take 1-2 tablets by mouth 2 (two) times daily as needed for mild constipation. 12/03/22   [provider]  sulfamethoxazole-trimethoprim (BACTRIM) 400-80 MG tablet Take 1 tablet by mouth daily.    [provider]  tacrolimus  (PROGRAF ) 1 MG capsule Take 5-6 mg by mouth See admin instructions. Take 6 capsules in the morning and 5 capsules every evening    [provider]  valGANciclovir (VALCYTE) 450 MG tablet Take by mouth daily. Take 2 tablets by mouth every evening for 180 days Patient not taking: Reported on 06/29/2023    [provider]    Allergies: Patient has no known allergies.    Review of Systems  Constitutional:  Positive for activity change and fatigue. Negative for fever.  HENT: Negative.    Respiratory: Negative.    Cardiovascular: Negative.   Gastrointestinal:  Positive for diarrhea. Negative for abdominal distention, abdominal pain, anal bleeding, blood in stool, constipation, nausea and vomiting.  Genitourinary: Negative.   Musculoskeletal: Negative.   Skin: Negative.   Neurological:  Positive for weakness. Negative for dizziness, tremors, seizures, syncope, facial asymmetry, speech difficulty, light-headedness, numbness and headaches.  All other systems reviewed and are negative.   Updated Vital Signs BP 100/72   Pulse (!) 131   Temp 99.7 F (37.6 C) (Oral)   Resp (!) 28   Ht 5' 11 (1.803 m)   Wt 88.5 kg   SpO2 95%   BMI 27.20 kg/m   Physical Exam Vitals and nursing note reviewed.  Constitutional:      General: He is not in acute distress.    Appearance: He is  well-developed. He is ill-appearing. He is not toxic-appearing or diaphoretic.  HENT:     Head: Normocephalic and atraumatic.     Nose: Nose normal.     Mouth/Throat:     Mouth: Mucous membranes are moist.   Eyes:     Pupils: Pupils are equal, round, and reactive to light.    Cardiovascular:     Rate and Rhythm: Normal rate and regular rhythm.     Pulses: Normal pulses.          Radial pulses are 2+ on the right side and 2+ on the left side.       Dorsalis pedis pulses are 2+ on the right side and 2+ on the left side.     Heart sounds: Normal heart sounds.  Pulmonary:     Effort:  Pulmonary effort is normal. No respiratory distress.     Breath sounds: Normal breath sounds.  Chest:     Comments: Midline incision, C/D/I Abdominal:     General: There is no distension.     Palpations: Abdomen is soft.     Tenderness: There is no abdominal tenderness. There is no left CVA tenderness, guarding or rebound.   Musculoskeletal:        General: No swelling, tenderness, deformity or signs of injury. Normal range of motion.     Cervical back: Normal range of motion and neck supple.     Right lower leg: No edema.     Left lower leg: No edema.     Comments: No bony tenderness, Full ROM to right extremities.   Skin:    General: Skin is warm and dry.     Capillary Refill: Capillary refill takes less than 2 seconds.   Neurological:     Mental Status: He is alert. Mental status is at baseline.     Motor: Weakness (chronic, left) present.     Gait: Gait abnormal (chronic, per wife).     Comments: Left upper and lower extremity weakness   (all labs ordered are listed, but only abnormal results are displayed) Labs Reviewed  COMPREHENSIVE METABOLIC PANEL WITH GFR - Abnormal; Notable for the following components:      Result Value   Potassium 5.5 (*)    CO2 21 (*)    Glucose, Bld 237 (*)    BUN 38 (*)    Creatinine, Ser 2.15 (*)    Albumin 2.9 (*)    AST 95 (*)    ALT 130 (*)     Alkaline Phosphatase 142 (*)    GFR, Estimated 39 (*)    All other components within normal limits  CBC WITH DIFFERENTIAL/PLATELET - Abnormal; Notable for the following components:   WBC 13.3 (*)    Lymphs Abs 5.9 (*)    Basophils Absolute 0.4 (*)    All other components within normal limits  I-STAT CG4 LACTIC ACID, ED - Abnormal; Notable for the following components:   Lactic Acid, Venous 2.3 (*)    All other components within normal limits  I-STAT CHEM 8, ED - Abnormal; Notable for the following components:   BUN 38 (*)    Creatinine, Ser 2.20 (*)    Glucose, Bld 233 (*)    TCO2 21 (*)    All other components within normal limits  RESP PANEL BY RT-PCR (RSV, FLU A&B, COVID)  RVPGX2  CULTURE, BLOOD (ROUTINE X 2)  CULTURE, BLOOD (ROUTINE X 2)  GASTROINTESTINAL PANEL BY PCR, STOOL (REPLACES STOOL CULTURE)  C DIFFICILE QUICK SCREEN W PCR REFLEX    PROTIME-INR  MAGNESIUM   TSH  LIPASE, BLOOD  CK  URINALYSIS, W/ REFLEX TO CULTURE (INFECTION SUSPECTED)  I-STAT CG4 LACTIC ACID, ED    EKG: EKG Interpretation Date/Time:  Tuesday September 30 2023 17:52:51 EDT Ventricular Rate:  132 PR Interval:  145 QRS Duration:  87 QT Interval:  283 QTC Calculation: 420 R Axis:   -13  Text Interpretation: Sinus or ectopic atrial tachycardia Low voltage, precordial leads Probable anteroseptal infarct, old Nonspecific T abnormalities, lateral leads No acute changes No significant change since last tracing Confirmed by Charlyn Sora [phone]) on 09/30/2023 9:37:41 PM  Radiology: CT CHEST ABDOMEN PELVIS WO CONTRAST Result Date: 09/30/2023 EXAM: CT CHEST, ABDOMEN AND PELVIS WITHOUT CONTRAST 09/30/2023 06:55:26 PM TECHNIQUE: CT of the chest, abdomen and pelvis was performed  without the administration of intravenous contrast. Multiplanar reformatted images are provided for review. Automated exposure control, iterative reconstruction, and/or weight based adjustment of the mA/kV was utilized to reduce the  radiation dose to as low as reasonably achievable. COMPARISON: CT abdomen / pelvis dated 06/29/2023 and CTA chest dated 03/19/2022. CLINICAL HISTORY: Sepsis. Pt bibems from home. Hx of heart transplant in Aug 2024, hemorrhagic stroke July 25, 2023. Left side flaccid since stroke. Lethargic started yesterday, worst today 82 systolic, HR around 140. Denies any pain. FINDINGS: CHEST: MEDIASTINUM: Postsurgical changes related to cardiac transplant. Trace pericardial fluid. Median sternotomy. THORACIC LYMPH NODES: No mediastinal, hilar or axillary lymphadenopathy. LUNGS AND PLEURA: Small bilateral pleural effusions with associated bilateral lower lobe atelectasis. ABDOMEN AND PELVIS: LIVER: The liver is unremarkable. GALLBLADDER AND BILE DUCTS: Gallbladder sludge (image 85), without associated inflammatory changes. No biliary ductal dilatation. SPLEEN: No acute abnormality. PANCREAS: No acute abnormality. ADRENAL GLANDS: No acute abnormality. KIDNEYS, URETERS AND BLADDER: No stones in the kidneys or ureters. No hydronephrosis. No perinephric or periureteral stranding. Urinary bladder is unremarkable. GI AND BOWEL: Stomach demonstrates no acute abnormality. There is no bowel obstruction. Normal appendix (image 94). REPRODUCTIVE ORGANS: No acute abnormality. PERITONEUM AND RETROPERITONEUM: No ascites. No free air. VASCULATURE: Aorta is normal in caliber. ABDOMINAL AND PELVIS LYMPH NODES: No lymphadenopathy. REPRODUCTIVE ORGANS: No acute abnormality. BONES AND SOFT TISSUES: No acute osseous abnormality. No focal soft tissue abnormality. IMPRESSION: 1. Small bilateral pleural effusions with associated bilateral lower lobe atelectasis. 2. Postsurgical changes related to cardiac transplant. 3. Negative CT abdomen/pelvis. Electronically signed by: Pinkie Pebbles MD 09/30/2023 07:02 PM EDT RP Workstation: [id number]   DG Chest Port 1 View Result Date: 09/30/2023 CLINICAL DATA:  Questionable sepsis - evaluate for  abnormality EXAM: PORTABLE CHEST 1 VIEW COMPARISON:  Chest x-ray 04/11/2022 FINDINGS: Prominent cardiac silhouette likely due to AP portable technique. Otherwise the heart and mediastinal contours are unchanged. No focal consolidation. No pulmonary edema. No pleural effusion. No pneumothorax. No acute osseous abnormality.  Sternotomy wires are intact. IMPRESSION: No active disease. Electronically Signed   By: Morgane  Naveau M.D.   On: 09/30/2023 18:32     .Critical Care  Performed by: Edie Rosebud LABOR, PA-C Authorized by: Edie Rosebud LABOR, PA-C   Critical care provider statement:    Critical care time (minutes):  75   Critical care was necessary to treat or prevent imminent or life-threatening deterioration of the following conditions:  Sepsis, shock, respiratory failure, renal failure and dehydration   Critical care was time spent personally by me on the following activities:  Development of treatment plan with patient or surrogate, discussions with consultants, evaluation of patient's response to treatment, examination of patient, ordering and review of laboratory studies, ordering and review of radiographic studies, ordering and performing treatments and interventions, pulse oximetry, re-evaluation of patient's condition and review of old charts    Medications Ordered in the ED  lactated ringers  infusion (0 mLs Intravenous Stopped 09/30/23 2257)  lactated ringers  bolus 1,000 mL (0 mLs Intravenous Stopped 09/30/23 1930)  ceFEPIme  (MAXIPIME ) 2 g in sodium chloride  0.9 % 100 mL IVPB (0 g Intravenous Stopped 09/30/23 1900)  metroNIDAZOLE  (FLAGYL ) IVPB 500 mg (0 mg Intravenous Stopped 09/30/23 1900)  acetaminophen  (TYLENOL ) tablet 1,000 mg (1,000 mg Oral Given 09/30/23 1924)  vancomycin  (VANCOREADY) IVPB 2000 mg/400 mL (0 mg Intravenous Stopped 09/30/23 2129)    Clinical Course as of 09/30/23 2302  Tue Sep 30, 2023  1757 Transplant 2024 followed by [facility].  Lethargic,  fatigue over the last few  days, felt warm with wife.  Tachycardic into 140s with EMS, hypotensive 82/66.  Alert.  Febrile here 100.7 [BH]  1912 99/75, HR 131 sinus tach [BH]  1919 [facility] transfer operator, will talk with Cardiac transplant team and call back [BH]  2024 Dr. Linzy Board with [facility] accepting  [BH]    Clinical Course User Index [BH] Arisa Congleton A, PA-C   Patient presents to the ED for concern of fatigue, this involves an extensive number of treatment options, and is a complaint that carries with it a high risk of complications and morbidity.  The differential diagnosis includes shock-cardiogenic, septic, obstructive, electrolyte abnormality, CVA, bleed.  Heart transplant followed by [facility], 2024, hypertension, hyperlipidemia, CVA, intracerebral hemorrhage, seizure disorder.  On arrival patient febrile, tachycardic, tachypneic, soft blood pressures concern for sepsis.  Unclear source.  Patient and family do note diarrhea x 3 days, no blood.  No recent antibiotics or travel.  Will give broad-spectrum antibiotics.  Plan on labs, imaging.  Will touch base with his transplant team at [facility] transplant team  Patient reassessed.  BP improved 100 systolic.  Has gotten 1.5 L IVF total.  Labs and imaging personally viewed and interpreted:  CBC leukocytosis 13.3 Potassium 5.5, creatinine 2.15, up from baseline, elevated LFTs with a normal T. Bili Lactic acid 2.3 Chest x-ray without acute abnormality Chest abdomen/pelvis with bilateral small pleural effusions, bilateral atelectasis EKG sinus tach  Patient reassessed.  Feels improved.  He is on 2 L for tachypnea.  Discussed with [facility] transfer operator will talk with cardiac transplant team and arrange transfer   Linzy Board, MD with [facility] excepting in transfer.  EMTALA filled out.  Considered sepsis, other etiologies of shock considering obstructive shock from PE, cardiogenic shock from heart failure, traumatic injuries, electrolyte  abnormalities, dehydration, neurogenic shock consider his CVA/bleed history.  He is alert and oriented.  He is at his baseline.  He appears well-perfused.  Patient and family deny any recent traumatic injuries.  His electrolytes here are mildly elevated, is in sinus tachycardia on his EKG however no sowed's of atrial fibrillation, flutter, ventricular abnormalities.  Does have a history of seizures however no seizure-like activity per patient and family.  No seizure-like activity here.  Low suspicion for status.  No hypoxia here however placed on 1 L via nasal cannula for some tachypnea into the 30s.  Family/significant other at bedside and notified notified.  Patient transferred to [facility] for shock with recent heart transplant in 2024.                                  Medical Decision Making Amount and/or Complexity of Data Reviewed Independent Historian: spouse and EMS External Data Reviewed: labs, radiology, ECG and notes. Labs: ordered. Decision-making details documented in ED Course. Radiology: ordered and independent interpretation performed. Decision-making details documented in ED Course. ECG/medicine tests: ordered and independent interpretation performed. Decision-making details documented in ED Course.  Risk OTC drugs. Prescription drug management. Parenteral controlled substances. Decision regarding hospitalization. Diagnosis or treatment significantly limited by social determinants of health.       Final diagnoses:  Septic shock (HCC)  Heart transplant recipient [facility])  Hyperkalemia  Elevated LFTs  AKI (acute kidney injury) [facility])    ED Discharge Orders     None          Auburn Hert A, PA-C 09/30/23 2302  Nakiah Osgood A, PA-C 10/02/23 ARDELLA Charlyn Sora, MD 10/07/23 1531

## 2023-09-30 NOTE — ED Notes (Signed)
 Called [facility] transfer line for [place]; acceptance received by Dr. Brooksie Cap, awaiting bed assignment and further instruction for transport.

## 2023-09-30 NOTE — ED Notes (Signed)
 Pt transported to CT ?

## 2023-09-30 NOTE — Sepsis Progress Note (Signed)
 Notified bedside nurse of need to draw repeat lactic acid.

## 2023-10-01 DIAGNOSIS — E872 Acidosis, unspecified: Secondary | ICD-10-CM | POA: Diagnosis not present

## 2023-10-01 DIAGNOSIS — E119 Type 2 diabetes mellitus without complications: Secondary | ICD-10-CM | POA: Diagnosis not present

## 2023-10-01 DIAGNOSIS — R000 Tachycardia, unspecified: Secondary | ICD-10-CM | POA: Diagnosis not present

## 2023-10-01 DIAGNOSIS — Z8673 Personal history of transient ischemic attack (TIA), and cerebral infarction without residual deficits: Secondary | ICD-10-CM | POA: Diagnosis not present

## 2023-10-01 DIAGNOSIS — K551 Chronic vascular disorders of intestine: Secondary | ICD-10-CM | POA: Diagnosis not present

## 2023-10-01 DIAGNOSIS — E1122 Type 2 diabetes mellitus with diabetic chronic kidney disease: Secondary | ICD-10-CM | POA: Diagnosis not present

## 2023-10-01 DIAGNOSIS — R918 Other nonspecific abnormal finding of lung field: Secondary | ICD-10-CM | POA: Diagnosis not present

## 2023-10-01 DIAGNOSIS — N189 Chronic kidney disease, unspecified: Secondary | ICD-10-CM | POA: Diagnosis not present

## 2023-10-01 DIAGNOSIS — D899 Disorder involving the immune mechanism, unspecified: Secondary | ICD-10-CM | POA: Diagnosis not present

## 2023-10-01 DIAGNOSIS — J9690 Respiratory failure, unspecified, unspecified whether with hypoxia or hypercapnia: Secondary | ICD-10-CM | POA: Diagnosis not present

## 2023-10-01 DIAGNOSIS — R931 Abnormal findings on diagnostic imaging of heart and coronary circulation: Secondary | ICD-10-CM | POA: Diagnosis not present

## 2023-10-01 DIAGNOSIS — I129 Hypertensive chronic kidney disease with stage 1 through stage 4 chronic kidney disease, or unspecified chronic kidney disease: Secondary | ICD-10-CM | POA: Diagnosis not present

## 2023-10-01 DIAGNOSIS — I469 Cardiac arrest, cause unspecified: Secondary | ICD-10-CM | POA: Diagnosis not present

## 2023-10-01 DIAGNOSIS — R570 Cardiogenic shock: Secondary | ICD-10-CM | POA: Diagnosis not present

## 2023-10-01 DIAGNOSIS — J9602 Acute respiratory failure with hypercapnia: Secondary | ICD-10-CM | POA: Diagnosis not present

## 2023-10-01 DIAGNOSIS — Z9911 Dependence on respirator [ventilator] status: Secondary | ICD-10-CM | POA: Diagnosis not present

## 2023-10-01 DIAGNOSIS — Z941 Heart transplant status: Secondary | ICD-10-CM | POA: Diagnosis not present

## 2023-10-01 DIAGNOSIS — R251 Tremor, unspecified: Secondary | ICD-10-CM | POA: Diagnosis not present

## 2023-10-01 DIAGNOSIS — R5383 Other fatigue: Secondary | ICD-10-CM | POA: Diagnosis not present

## 2023-10-01 DIAGNOSIS — J9601 Acute respiratory failure with hypoxia: Secondary | ICD-10-CM | POA: Diagnosis not present

## 2023-10-01 DIAGNOSIS — F39 Unspecified mood [affective] disorder: Secondary | ICD-10-CM | POA: Diagnosis not present

## 2023-10-01 DIAGNOSIS — I959 Hypotension, unspecified: Secondary | ICD-10-CM | POA: Diagnosis not present

## 2023-10-01 DIAGNOSIS — G9340 Encephalopathy, unspecified: Secondary | ICD-10-CM | POA: Diagnosis not present

## 2023-10-01 DIAGNOSIS — R509 Fever, unspecified: Secondary | ICD-10-CM | POA: Diagnosis not present

## 2023-10-02 DIAGNOSIS — Z941 Heart transplant status: Secondary | ICD-10-CM | POA: Diagnosis not present

## 2023-10-02 DIAGNOSIS — I6930 Unspecified sequelae of cerebral infarction: Secondary | ICD-10-CM | POA: Diagnosis not present

## 2023-10-02 DIAGNOSIS — R29818 Other symptoms and signs involving the nervous system: Secondary | ICD-10-CM | POA: Diagnosis not present

## 2023-10-02 DIAGNOSIS — N179 Acute kidney failure, unspecified: Secondary | ICD-10-CM | POA: Diagnosis not present

## 2023-10-02 DIAGNOSIS — J9601 Acute respiratory failure with hypoxia: Secondary | ICD-10-CM | POA: Diagnosis not present

## 2023-10-02 DIAGNOSIS — Z452 Encounter for adjustment and management of vascular access device: Secondary | ICD-10-CM | POA: Diagnosis not present

## 2023-10-02 DIAGNOSIS — I10 Essential (primary) hypertension: Secondary | ICD-10-CM | POA: Diagnosis not present

## 2023-10-02 DIAGNOSIS — I469 Cardiac arrest, cause unspecified: Secondary | ICD-10-CM | POA: Diagnosis not present

## 2023-10-02 DIAGNOSIS — D62 Acute posthemorrhagic anemia: Secondary | ICD-10-CM | POA: Diagnosis not present

## 2023-10-02 DIAGNOSIS — J9602 Acute respiratory failure with hypercapnia: Secondary | ICD-10-CM | POA: Diagnosis not present

## 2023-10-02 DIAGNOSIS — D849 Immunodeficiency, unspecified: Secondary | ICD-10-CM | POA: Diagnosis not present

## 2023-10-02 DIAGNOSIS — J811 Chronic pulmonary edema: Secondary | ICD-10-CM | POA: Diagnosis not present

## 2023-10-02 DIAGNOSIS — Z4659 Encounter for fitting and adjustment of other gastrointestinal appliance and device: Secondary | ICD-10-CM | POA: Diagnosis not present

## 2023-10-02 DIAGNOSIS — R569 Unspecified convulsions: Secondary | ICD-10-CM | POA: Diagnosis not present

## 2023-10-02 DIAGNOSIS — R918 Other nonspecific abnormal finding of lung field: Secondary | ICD-10-CM | POA: Diagnosis not present

## 2023-10-02 DIAGNOSIS — R570 Cardiogenic shock: Secondary | ICD-10-CM | POA: Diagnosis not present

## 2023-10-02 DIAGNOSIS — G8918 Other acute postprocedural pain: Secondary | ICD-10-CM | POA: Diagnosis not present

## 2023-10-03 DIAGNOSIS — Z941 Heart transplant status: Secondary | ICD-10-CM | POA: Diagnosis not present

## 2023-10-03 DIAGNOSIS — J9602 Acute respiratory failure with hypercapnia: Secondary | ICD-10-CM | POA: Diagnosis not present

## 2023-10-03 DIAGNOSIS — N179 Acute kidney failure, unspecified: Secondary | ICD-10-CM | POA: Diagnosis not present

## 2023-10-03 DIAGNOSIS — D62 Acute posthemorrhagic anemia: Secondary | ICD-10-CM | POA: Diagnosis not present

## 2023-10-03 DIAGNOSIS — I10 Essential (primary) hypertension: Secondary | ICD-10-CM | POA: Diagnosis not present

## 2023-10-03 DIAGNOSIS — G8918 Other acute postprocedural pain: Secondary | ICD-10-CM | POA: Diagnosis not present

## 2023-10-03 DIAGNOSIS — J9601 Acute respiratory failure with hypoxia: Secondary | ICD-10-CM | POA: Diagnosis not present

## 2023-10-03 DIAGNOSIS — R570 Cardiogenic shock: Secondary | ICD-10-CM | POA: Diagnosis not present

## 2023-10-03 LAB — CULTURE, BLOOD (ROUTINE X 2)

## 2023-10-04 DIAGNOSIS — J90 Pleural effusion, not elsewhere classified: Secondary | ICD-10-CM | POA: Diagnosis not present

## 2023-10-04 DIAGNOSIS — I509 Heart failure, unspecified: Secondary | ICD-10-CM | POA: Diagnosis not present

## 2023-10-04 DIAGNOSIS — Z4682 Encounter for fitting and adjustment of non-vascular catheter: Secondary | ICD-10-CM | POA: Diagnosis not present

## 2023-10-04 DIAGNOSIS — I110 Hypertensive heart disease with heart failure: Secondary | ICD-10-CM | POA: Diagnosis not present

## 2023-10-04 DIAGNOSIS — Z941 Heart transplant status: Secondary | ICD-10-CM | POA: Diagnosis not present

## 2023-10-05 LAB — CULTURE, BLOOD (ROUTINE X 2)
Culture: NO GROWTH
Culture: NO GROWTH

## 2023-10-06 DIAGNOSIS — J9601 Acute respiratory failure with hypoxia: Secondary | ICD-10-CM | POA: Diagnosis not present

## 2023-10-06 DIAGNOSIS — G8918 Other acute postprocedural pain: Secondary | ICD-10-CM | POA: Diagnosis not present

## 2023-10-06 DIAGNOSIS — R197 Diarrhea, unspecified: Secondary | ICD-10-CM | POA: Diagnosis not present

## 2023-10-06 DIAGNOSIS — B259 Cytomegaloviral disease, unspecified: Secondary | ICD-10-CM | POA: Diagnosis not present

## 2023-10-06 DIAGNOSIS — R570 Cardiogenic shock: Secondary | ICD-10-CM | POA: Diagnosis not present

## 2023-10-06 DIAGNOSIS — Z941 Heart transplant status: Secondary | ICD-10-CM | POA: Diagnosis not present

## 2023-10-06 DIAGNOSIS — J90 Pleural effusion, not elsewhere classified: Secondary | ICD-10-CM | POA: Diagnosis not present

## 2023-10-06 DIAGNOSIS — J9602 Acute respiratory failure with hypercapnia: Secondary | ICD-10-CM | POA: Diagnosis not present

## 2023-10-06 DIAGNOSIS — N179 Acute kidney failure, unspecified: Secondary | ICD-10-CM | POA: Diagnosis not present

## 2023-10-06 DIAGNOSIS — A419 Sepsis, unspecified organism: Secondary | ICD-10-CM | POA: Diagnosis not present

## 2023-10-06 DIAGNOSIS — D62 Acute posthemorrhagic anemia: Secondary | ICD-10-CM | POA: Diagnosis not present

## 2023-10-06 DIAGNOSIS — I959 Hypotension, unspecified: Secondary | ICD-10-CM | POA: Diagnosis not present

## 2023-10-06 DIAGNOSIS — R918 Other nonspecific abnormal finding of lung field: Secondary | ICD-10-CM | POA: Diagnosis not present

## 2023-10-06 DIAGNOSIS — I469 Cardiac arrest, cause unspecified: Secondary | ICD-10-CM | POA: Diagnosis not present

## 2023-10-06 DIAGNOSIS — Z4682 Encounter for fitting and adjustment of non-vascular catheter: Secondary | ICD-10-CM | POA: Diagnosis not present

## 2023-10-06 DIAGNOSIS — I10 Essential (primary) hypertension: Secondary | ICD-10-CM | POA: Diagnosis not present

## 2023-10-07 ENCOUNTER — Inpatient Hospital Stay: Admitting: Family Medicine

## 2023-10-07 DIAGNOSIS — J90 Pleural effusion, not elsewhere classified: Secondary | ICD-10-CM | POA: Diagnosis not present

## 2023-10-07 DIAGNOSIS — D849 Immunodeficiency, unspecified: Secondary | ICD-10-CM | POA: Diagnosis not present

## 2023-10-07 DIAGNOSIS — Z941 Heart transplant status: Secondary | ICD-10-CM | POA: Diagnosis not present

## 2023-10-07 DIAGNOSIS — R918 Other nonspecific abnormal finding of lung field: Secondary | ICD-10-CM | POA: Diagnosis not present

## 2023-10-07 DIAGNOSIS — G9341 Metabolic encephalopathy: Secondary | ICD-10-CM | POA: Diagnosis not present

## 2023-10-07 DIAGNOSIS — R258 Other abnormal involuntary movements: Secondary | ICD-10-CM | POA: Diagnosis not present

## 2023-10-07 DIAGNOSIS — R569 Unspecified convulsions: Secondary | ICD-10-CM | POA: Diagnosis not present

## 2023-10-08 DIAGNOSIS — Z941 Heart transplant status: Secondary | ICD-10-CM | POA: Diagnosis not present

## 2023-10-08 DIAGNOSIS — J9690 Respiratory failure, unspecified, unspecified whether with hypoxia or hypercapnia: Secondary | ICD-10-CM | POA: Diagnosis not present

## 2023-10-08 DIAGNOSIS — R579 Shock, unspecified: Secondary | ICD-10-CM | POA: Diagnosis not present

## 2023-10-08 DIAGNOSIS — B259 Cytomegaloviral disease, unspecified: Secondary | ICD-10-CM | POA: Diagnosis not present

## 2023-10-08 DIAGNOSIS — I517 Cardiomegaly: Secondary | ICD-10-CM | POA: Diagnosis not present

## 2023-10-08 DIAGNOSIS — Z4682 Encounter for fitting and adjustment of non-vascular catheter: Secondary | ICD-10-CM | POA: Diagnosis not present

## 2023-10-08 DIAGNOSIS — I469 Cardiac arrest, cause unspecified: Secondary | ICD-10-CM | POA: Diagnosis not present

## 2023-10-08 DIAGNOSIS — R918 Other nonspecific abnormal finding of lung field: Secondary | ICD-10-CM | POA: Diagnosis not present

## 2023-10-08 DIAGNOSIS — J90 Pleural effusion, not elsewhere classified: Secondary | ICD-10-CM | POA: Diagnosis not present

## 2023-10-09 DIAGNOSIS — D689 Coagulation defect, unspecified: Secondary | ICD-10-CM | POA: Diagnosis not present

## 2023-10-09 DIAGNOSIS — J9601 Acute respiratory failure with hypoxia: Secondary | ICD-10-CM | POA: Diagnosis not present

## 2023-10-09 DIAGNOSIS — D62 Acute posthemorrhagic anemia: Secondary | ICD-10-CM | POA: Diagnosis not present

## 2023-10-09 DIAGNOSIS — J90 Pleural effusion, not elsewhere classified: Secondary | ICD-10-CM | POA: Diagnosis not present

## 2023-10-09 DIAGNOSIS — J9602 Acute respiratory failure with hypercapnia: Secondary | ICD-10-CM | POA: Diagnosis not present

## 2023-10-09 DIAGNOSIS — R570 Cardiogenic shock: Secondary | ICD-10-CM | POA: Diagnosis not present

## 2023-10-09 DIAGNOSIS — Z941 Heart transplant status: Secondary | ICD-10-CM | POA: Diagnosis not present

## 2023-10-09 DIAGNOSIS — G8918 Other acute postprocedural pain: Secondary | ICD-10-CM | POA: Diagnosis not present

## 2023-10-09 DIAGNOSIS — Z452 Encounter for adjustment and management of vascular access device: Secondary | ICD-10-CM | POA: Diagnosis not present

## 2023-10-09 DIAGNOSIS — I10 Essential (primary) hypertension: Secondary | ICD-10-CM | POA: Diagnosis not present

## 2023-10-09 DIAGNOSIS — N179 Acute kidney failure, unspecified: Secondary | ICD-10-CM | POA: Diagnosis not present

## 2023-10-10 DIAGNOSIS — Z941 Heart transplant status: Secondary | ICD-10-CM | POA: Diagnosis not present

## 2023-10-10 DIAGNOSIS — J90 Pleural effusion, not elsewhere classified: Secondary | ICD-10-CM | POA: Diagnosis not present

## 2023-10-10 DIAGNOSIS — N179 Acute kidney failure, unspecified: Secondary | ICD-10-CM | POA: Diagnosis not present

## 2023-10-10 DIAGNOSIS — R931 Abnormal findings on diagnostic imaging of heart and coronary circulation: Secondary | ICD-10-CM | POA: Diagnosis not present

## 2023-10-10 DIAGNOSIS — E119 Type 2 diabetes mellitus without complications: Secondary | ICD-10-CM | POA: Diagnosis not present

## 2023-10-10 DIAGNOSIS — D62 Acute posthemorrhagic anemia: Secondary | ICD-10-CM | POA: Diagnosis not present

## 2023-10-10 DIAGNOSIS — I959 Hypotension, unspecified: Secondary | ICD-10-CM | POA: Diagnosis not present

## 2023-10-10 DIAGNOSIS — I10 Essential (primary) hypertension: Secondary | ICD-10-CM | POA: Diagnosis not present

## 2023-10-10 DIAGNOSIS — G8918 Other acute postprocedural pain: Secondary | ICD-10-CM | POA: Diagnosis not present

## 2023-10-10 DIAGNOSIS — Z4659 Encounter for fitting and adjustment of other gastrointestinal appliance and device: Secondary | ICD-10-CM | POA: Diagnosis not present

## 2023-10-10 DIAGNOSIS — R918 Other nonspecific abnormal finding of lung field: Secondary | ICD-10-CM | POA: Diagnosis not present

## 2023-10-10 DIAGNOSIS — E139 Other specified diabetes mellitus without complications: Secondary | ICD-10-CM | POA: Diagnosis not present

## 2023-10-10 DIAGNOSIS — A419 Sepsis, unspecified organism: Secondary | ICD-10-CM | POA: Diagnosis not present

## 2023-10-11 DIAGNOSIS — Z930 Tracheostomy status: Secondary | ICD-10-CM | POA: Diagnosis not present

## 2023-10-11 DIAGNOSIS — E139 Other specified diabetes mellitus without complications: Secondary | ICD-10-CM | POA: Diagnosis not present

## 2023-10-11 DIAGNOSIS — B259 Cytomegaloviral disease, unspecified: Secondary | ICD-10-CM | POA: Diagnosis not present

## 2023-10-11 DIAGNOSIS — D849 Immunodeficiency, unspecified: Secondary | ICD-10-CM | POA: Diagnosis not present

## 2023-10-11 DIAGNOSIS — Z941 Heart transplant status: Secondary | ICD-10-CM | POA: Diagnosis not present

## 2023-10-11 DIAGNOSIS — R509 Fever, unspecified: Secondary | ICD-10-CM | POA: Diagnosis not present

## 2023-10-12 DIAGNOSIS — Z941 Heart transplant status: Secondary | ICD-10-CM | POA: Diagnosis not present

## 2023-10-12 DIAGNOSIS — R140 Abdominal distension (gaseous): Secondary | ICD-10-CM | POA: Diagnosis not present

## 2023-10-12 DIAGNOSIS — E139 Other specified diabetes mellitus without complications: Secondary | ICD-10-CM | POA: Diagnosis not present

## 2023-10-12 DIAGNOSIS — R590 Localized enlarged lymph nodes: Secondary | ICD-10-CM | POA: Diagnosis not present

## 2023-10-12 DIAGNOSIS — Z452 Encounter for adjustment and management of vascular access device: Secondary | ICD-10-CM | POA: Diagnosis not present

## 2023-10-12 DIAGNOSIS — J90 Pleural effusion, not elsewhere classified: Secondary | ICD-10-CM | POA: Diagnosis not present

## 2023-10-13 DIAGNOSIS — E119 Type 2 diabetes mellitus without complications: Secondary | ICD-10-CM | POA: Diagnosis not present

## 2023-10-13 DIAGNOSIS — R918 Other nonspecific abnormal finding of lung field: Secondary | ICD-10-CM | POA: Diagnosis not present

## 2023-10-13 DIAGNOSIS — Z941 Heart transplant status: Secondary | ICD-10-CM | POA: Diagnosis not present

## 2023-10-13 DIAGNOSIS — E139 Other specified diabetes mellitus without complications: Secondary | ICD-10-CM | POA: Diagnosis not present

## 2023-10-13 DIAGNOSIS — D62 Acute posthemorrhagic anemia: Secondary | ICD-10-CM | POA: Diagnosis not present

## 2023-10-13 DIAGNOSIS — G8918 Other acute postprocedural pain: Secondary | ICD-10-CM | POA: Diagnosis not present

## 2023-10-13 DIAGNOSIS — J9600 Acute respiratory failure, unspecified whether with hypoxia or hypercapnia: Secondary | ICD-10-CM | POA: Diagnosis not present

## 2023-10-13 DIAGNOSIS — Z09 Encounter for follow-up examination after completed treatment for conditions other than malignant neoplasm: Secondary | ICD-10-CM | POA: Diagnosis not present

## 2023-10-13 DIAGNOSIS — Z8673 Personal history of transient ischemic attack (TIA), and cerebral infarction without residual deficits: Secondary | ICD-10-CM | POA: Diagnosis not present

## 2023-10-13 DIAGNOSIS — I82611 Acute embolism and thrombosis of superficial veins of right upper extremity: Secondary | ICD-10-CM | POA: Diagnosis not present

## 2023-10-13 DIAGNOSIS — N179 Acute kidney failure, unspecified: Secondary | ICD-10-CM | POA: Diagnosis not present

## 2023-10-13 DIAGNOSIS — Z452 Encounter for adjustment and management of vascular access device: Secondary | ICD-10-CM | POA: Diagnosis not present

## 2023-10-13 DIAGNOSIS — J90 Pleural effusion, not elsewhere classified: Secondary | ICD-10-CM | POA: Diagnosis not present

## 2023-10-14 DIAGNOSIS — Z452 Encounter for adjustment and management of vascular access device: Secondary | ICD-10-CM | POA: Diagnosis not present

## 2023-10-14 DIAGNOSIS — D62 Acute posthemorrhagic anemia: Secondary | ICD-10-CM | POA: Diagnosis not present

## 2023-10-14 DIAGNOSIS — B259 Cytomegaloviral disease, unspecified: Secondary | ICD-10-CM | POA: Diagnosis not present

## 2023-10-14 DIAGNOSIS — D849 Immunodeficiency, unspecified: Secondary | ICD-10-CM | POA: Diagnosis not present

## 2023-10-14 DIAGNOSIS — J90 Pleural effusion, not elsewhere classified: Secondary | ICD-10-CM | POA: Diagnosis not present

## 2023-10-14 DIAGNOSIS — R918 Other nonspecific abnormal finding of lung field: Secondary | ICD-10-CM | POA: Diagnosis not present

## 2023-10-14 DIAGNOSIS — R261 Paralytic gait: Secondary | ICD-10-CM | POA: Diagnosis not present

## 2023-10-14 DIAGNOSIS — Z941 Heart transplant status: Secondary | ICD-10-CM | POA: Diagnosis not present

## 2023-10-14 DIAGNOSIS — Z136 Encounter for screening for cardiovascular disorders: Secondary | ICD-10-CM | POA: Diagnosis not present

## 2023-10-14 DIAGNOSIS — G9340 Encephalopathy, unspecified: Secondary | ICD-10-CM | POA: Diagnosis not present

## 2023-10-14 DIAGNOSIS — E119 Type 2 diabetes mellitus without complications: Secondary | ICD-10-CM | POA: Diagnosis not present

## 2023-10-14 DIAGNOSIS — G8918 Other acute postprocedural pain: Secondary | ICD-10-CM | POA: Diagnosis not present

## 2023-10-14 DIAGNOSIS — I69198 Other sequelae of nontraumatic intracerebral hemorrhage: Secondary | ICD-10-CM | POA: Diagnosis not present

## 2023-10-14 DIAGNOSIS — N179 Acute kidney failure, unspecified: Secondary | ICD-10-CM | POA: Diagnosis not present

## 2023-10-14 DIAGNOSIS — E139 Other specified diabetes mellitus without complications: Secondary | ICD-10-CM | POA: Diagnosis not present

## 2023-10-14 DIAGNOSIS — Z930 Tracheostomy status: Secondary | ICD-10-CM | POA: Diagnosis not present

## 2023-10-14 DIAGNOSIS — Z4682 Encounter for fitting and adjustment of non-vascular catheter: Secondary | ICD-10-CM | POA: Diagnosis not present

## 2023-10-14 DIAGNOSIS — J9600 Acute respiratory failure, unspecified whether with hypoxia or hypercapnia: Secondary | ICD-10-CM | POA: Diagnosis not present

## 2023-10-14 DIAGNOSIS — R509 Fever, unspecified: Secondary | ICD-10-CM | POA: Diagnosis not present

## 2023-10-15 DIAGNOSIS — Z452 Encounter for adjustment and management of vascular access device: Secondary | ICD-10-CM | POA: Diagnosis not present

## 2023-10-15 DIAGNOSIS — J90 Pleural effusion, not elsewhere classified: Secondary | ICD-10-CM | POA: Diagnosis not present

## 2023-10-15 DIAGNOSIS — T86298 Other complications of heart transplant: Secondary | ICD-10-CM | POA: Diagnosis not present

## 2023-10-15 DIAGNOSIS — R918 Other nonspecific abnormal finding of lung field: Secondary | ICD-10-CM | POA: Diagnosis not present

## 2023-10-15 DIAGNOSIS — T8621 Heart transplant rejection: Secondary | ICD-10-CM | POA: Diagnosis not present

## 2023-10-15 DIAGNOSIS — Z4682 Encounter for fitting and adjustment of non-vascular catheter: Secondary | ICD-10-CM | POA: Diagnosis not present

## 2023-10-15 DIAGNOSIS — Z941 Heart transplant status: Secondary | ICD-10-CM | POA: Diagnosis not present

## 2023-10-16 DIAGNOSIS — R931 Abnormal findings on diagnostic imaging of heart and coronary circulation: Secondary | ICD-10-CM | POA: Diagnosis not present

## 2023-10-16 DIAGNOSIS — Z941 Heart transplant status: Secondary | ICD-10-CM | POA: Diagnosis not present

## 2023-10-16 DIAGNOSIS — Z930 Tracheostomy status: Secondary | ICD-10-CM | POA: Diagnosis not present

## 2023-10-16 DIAGNOSIS — J948 Other specified pleural conditions: Secondary | ICD-10-CM | POA: Diagnosis not present

## 2023-10-16 DIAGNOSIS — Z452 Encounter for adjustment and management of vascular access device: Secondary | ICD-10-CM | POA: Diagnosis not present

## 2023-10-16 DIAGNOSIS — I371 Nonrheumatic pulmonary valve insufficiency: Secondary | ICD-10-CM | POA: Diagnosis not present

## 2023-10-16 DIAGNOSIS — Z4682 Encounter for fitting and adjustment of non-vascular catheter: Secondary | ICD-10-CM | POA: Diagnosis not present

## 2023-10-16 DIAGNOSIS — R918 Other nonspecific abnormal finding of lung field: Secondary | ICD-10-CM | POA: Diagnosis not present

## 2023-10-16 DIAGNOSIS — I517 Cardiomegaly: Secondary | ICD-10-CM | POA: Diagnosis not present

## 2023-10-17 DIAGNOSIS — Z941 Heart transplant status: Secondary | ICD-10-CM | POA: Diagnosis not present

## 2023-10-17 DIAGNOSIS — E119 Type 2 diabetes mellitus without complications: Secondary | ICD-10-CM | POA: Diagnosis not present

## 2023-10-17 DIAGNOSIS — G8918 Other acute postprocedural pain: Secondary | ICD-10-CM | POA: Diagnosis not present

## 2023-10-17 DIAGNOSIS — R569 Unspecified convulsions: Secondary | ICD-10-CM | POA: Diagnosis not present

## 2023-10-17 DIAGNOSIS — E7800 Pure hypercholesterolemia, unspecified: Secondary | ICD-10-CM | POA: Diagnosis not present

## 2023-10-17 DIAGNOSIS — I10 Essential (primary) hypertension: Secondary | ICD-10-CM | POA: Diagnosis not present

## 2023-10-17 DIAGNOSIS — R918 Other nonspecific abnormal finding of lung field: Secondary | ICD-10-CM | POA: Diagnosis not present

## 2023-10-17 DIAGNOSIS — J810 Acute pulmonary edema: Secondary | ICD-10-CM | POA: Diagnosis not present

## 2023-10-17 DIAGNOSIS — N179 Acute kidney failure, unspecified: Secondary | ICD-10-CM | POA: Diagnosis not present

## 2023-10-17 DIAGNOSIS — D62 Acute posthemorrhagic anemia: Secondary | ICD-10-CM | POA: Diagnosis not present

## 2023-10-17 DIAGNOSIS — Z930 Tracheostomy status: Secondary | ICD-10-CM | POA: Diagnosis not present

## 2023-10-17 DIAGNOSIS — Z794 Long term (current) use of insulin: Secondary | ICD-10-CM | POA: Diagnosis not present

## 2023-10-17 DIAGNOSIS — Z4682 Encounter for fitting and adjustment of non-vascular catheter: Secondary | ICD-10-CM | POA: Diagnosis not present

## 2023-10-17 DIAGNOSIS — E870 Hyperosmolality and hypernatremia: Secondary | ICD-10-CM | POA: Diagnosis not present

## 2023-10-17 DIAGNOSIS — Z452 Encounter for adjustment and management of vascular access device: Secondary | ICD-10-CM | POA: Diagnosis not present

## 2023-10-18 DIAGNOSIS — R261 Paralytic gait: Secondary | ICD-10-CM | POA: Diagnosis not present

## 2023-10-18 DIAGNOSIS — I69198 Other sequelae of nontraumatic intracerebral hemorrhage: Secondary | ICD-10-CM | POA: Diagnosis not present

## 2023-10-18 DIAGNOSIS — Z941 Heart transplant status: Secondary | ICD-10-CM | POA: Diagnosis not present

## 2023-10-19 DIAGNOSIS — E1065 Type 1 diabetes mellitus with hyperglycemia: Secondary | ICD-10-CM | POA: Diagnosis not present

## 2023-10-19 DIAGNOSIS — Z941 Heart transplant status: Secondary | ICD-10-CM | POA: Diagnosis not present

## 2023-10-19 DIAGNOSIS — Z4659 Encounter for fitting and adjustment of other gastrointestinal appliance and device: Secondary | ICD-10-CM | POA: Diagnosis not present

## 2023-10-20 DIAGNOSIS — Z930 Tracheostomy status: Secondary | ICD-10-CM | POA: Diagnosis not present

## 2023-10-20 DIAGNOSIS — K6389 Other specified diseases of intestine: Secondary | ICD-10-CM | POA: Diagnosis not present

## 2023-10-20 DIAGNOSIS — R918 Other nonspecific abnormal finding of lung field: Secondary | ICD-10-CM | POA: Diagnosis not present

## 2023-10-20 DIAGNOSIS — R931 Abnormal findings on diagnostic imaging of heart and coronary circulation: Secondary | ICD-10-CM | POA: Diagnosis not present

## 2023-10-20 DIAGNOSIS — Z941 Heart transplant status: Secondary | ICD-10-CM | POA: Diagnosis not present

## 2023-10-20 DIAGNOSIS — Z4682 Encounter for fitting and adjustment of non-vascular catheter: Secondary | ICD-10-CM | POA: Diagnosis not present

## 2023-10-20 DIAGNOSIS — R569 Unspecified convulsions: Secondary | ICD-10-CM | POA: Diagnosis not present

## 2023-10-20 DIAGNOSIS — R195 Other fecal abnormalities: Secondary | ICD-10-CM | POA: Diagnosis not present

## 2023-10-21 DIAGNOSIS — I469 Cardiac arrest, cause unspecified: Secondary | ICD-10-CM | POA: Diagnosis not present

## 2023-10-21 DIAGNOSIS — A419 Sepsis, unspecified organism: Secondary | ICD-10-CM | POA: Diagnosis not present

## 2023-10-21 DIAGNOSIS — D849 Immunodeficiency, unspecified: Secondary | ICD-10-CM | POA: Diagnosis not present

## 2023-10-21 DIAGNOSIS — R918 Other nonspecific abnormal finding of lung field: Secondary | ICD-10-CM | POA: Diagnosis not present

## 2023-10-21 DIAGNOSIS — Z930 Tracheostomy status: Secondary | ICD-10-CM | POA: Diagnosis not present

## 2023-10-21 DIAGNOSIS — Z941 Heart transplant status: Secondary | ICD-10-CM | POA: Diagnosis not present

## 2023-10-22 ENCOUNTER — Inpatient Hospital Stay: Admitting: Nurse Practitioner

## 2023-10-22 DIAGNOSIS — T380X5A Adverse effect of glucocorticoids and synthetic analogues, initial encounter: Secondary | ICD-10-CM | POA: Diagnosis not present

## 2023-10-22 DIAGNOSIS — R739 Hyperglycemia, unspecified: Secondary | ICD-10-CM | POA: Diagnosis not present

## 2023-10-22 DIAGNOSIS — Z941 Heart transplant status: Secondary | ICD-10-CM | POA: Diagnosis not present

## 2023-10-22 NOTE — Discharge Summary (Signed)
 [facility]                 Transplant Cardiology Discharge Summary   Admit Date: 09/30/2023  Discharge Date: 11/24/2023  Admitting Physician: Linzy Board, MD  Discharge Physician: Bulah Juliene Lenis, MD  Primary Care Provider: Vicci Barnie NOVAK, MD  Discharge Destination: Acute Rehab Facility: [facility]  Discharge Services: PT and OT  Code Status: Full Code   Admission Diagnoses:  sepsis, s/p heart txp 2024  Discharge Diagnoses:  Principal Problem:   Heart transplant, orthotopic, status (CMS/HHS-HCC) Active Problems:   Mixed dyslipidemia   Essential hypertension   Poorly controlled type 2 diabetes mellitus with circulatory disorder (CMS/HHS-HCC)   Seizure (CMS/HHS-HCC)   Immunosuppressed status (CMS/HHS-HCC)   Anemia of chronic disease   Right ventricular dysfunction   On enteral nutrition   CVA (cerebral vascular accident) (CMS/HHS-HCC)   Cardiac arrest (CMS/HHS-HCC)   Acute on chronic respiratory failure with hypoxia (CMS/HHS-HCC)   Pleural effusion on right   Cytomegalovirus (CMV) viremia (CMS/HHS-HCC)   Fever   Tracheostomy in place (CMS/HHS-HCC)   Left hemiparesis (CMS/HHS-HCC)   Hyperchloremia   Apathy   Klebsiella pneumoniae infection   Other insomnia   Pain Resolved Problems:   Hyperkalemia   Cardiogenic shock (CMS/HHS-HCC)   Patient receiving ECMO   Shock liver (HHS-HCC)   AKI (acute kidney injury) ()   Oliguria   Diarrhea   Personal history of ECMO   Leukocytosis   Hypernatremia   On mechanically assisted ventilation (CMS/HHS-HCC)   Encounter for weaning from ventilator (CMS/HHS-HCC)   Encounter for monitoring diuretic therapy   Thrombocytopenia ()   Thrush     Anticipatory Guidance: Admitted with lethargy/ weakness. Hospitalization c/b PEA arrest (6/18) requiring ECMO, tracheostomy (removed 8/4), bilateral pleural effusions, ESBL klebsiella in BAL, CMV viremia s/p GCV.  - Switched from  tacrolimus  to cyclosporine  due to hemolysis/neuro concerns - Reduced atorvastatin  given interaction with cyclosporine  - Holding MMF. Can re-evaluate outpatient. - Carvedilol  in place of home propranolol for HTN - Plan for 3 months maintenance Valcyte  (~10/26) followed by q2 week CMV PCR upon cessation x 3 months  OHT: 11/26/22 by Dr. Arnie CMV: D+/R- TTE: 6/18 pre-arrest EF 40%, severe RV dysfx.  Echo: 7/29 EF>55%, normal BiV EMB: 7/2 0R, insufficient sample for pAMR CYA goal: 150-200 Current regimen: CYA (switched from FK this admission with hemolysis and neuro concerns), prednisone . Holding MMF 250mg  w/ infections Infectious PPx: Bactrim , CMV: GCV General PPx: Holding Ca/vit D and statin PLEX/RATG Hx: no    Cardiac Rehab: not indicated due to patient is being discharged to an acute rehab or skilled nursing facility  Patient Discharge Instructions:   Diet/Nutrition Orders  Order Comments: Diet/Nutrition Orders (Active and Future) (From admission, onward)    Start     Ordered   11/10/23 1401  Diet regular L0 Thin Liquids; Immunocompromised; Sterile  Water Protocol  Diet effective now       Comments: Supervision and assistance   11/10/23 1400             Vital Signs  Order Comments: Per facility routine   Wound Care  Order Comments: Instructions:            Wound Care  Every Shift       Body Site: Bilateral groin wound  Instructions: Cleanse bilateral groin with saline, thoroughly pat  dry and  dress with dry gauze. Change BID        Wound Care  As directed       Instructions: per protocol              Follow-up with Primary Care Provider  Order Comments: SNF to schedule follow up with Primary Care within 14 days of discharge from SNF   Physical Therapy Evaluation  Order Comments: Evaluation and treatment   Occupational Therapy Evaluation  Order Comments: Evaluation and treatment   Universal Precautions     [facility] Provider Follow-up: Future  Appointments  Date Time Provider Department Center  12/26/2023 10:00 AM ENDO HOSPITAL FOLLOW UP [facility]  01/13/2024 12:00 PM Metjian, Charletta Lenis, MD [facility]  03/03/2024  3:00 PM Kimberlee Bubba Service, MD [facility]    Non-[facility] Provider Follow-up: none  Report Issues: By using [facility], or by calling the Heart Transplant office at [phone]. For urgent issues or after business hours (after 5pm on weekdays and anytime on weekends), call the [facility] Operator [phone] and ask to page the on-call transplant cardiologist.     Allergies/Intolerances:  No Known Allergies   Medications:     Discharge Medications     New Medications      Details  acetaminophen  325 MG tablet Commonly known as: TYLENOL   975 mg, Oral, Every 6 hours PRN Refills: 0   calcium  acetate(phosphat bind) 667 mg capsule Commonly known as: PHOSLO   1,334 mg, Oral, 2 times Daily with meals Refills: 0   carboxymethylcellulose 0.5 % ophthalmic solution Commonly known as: REFRESH PLUS  2 drops, Both Eyes, 3 times Daily PRN Refills: 0   carvediloL  12.5 MG tablet Commonly known as: COREG   12.5 mg, Oral, 2 times Daily with meals Refills: 0   cycloSPORINE  modified 25 mg capsule Commonly known as: GENGRAF   Take 5 capsules (125 mg total) by mouth at bedtime AND 5 capsules (125 mg total) once daily. Refills: 0   diclofenac  1 % topical gel Commonly known as: VOLTAREN   2 g, Topical, 4 times Daily PRN Refills: 0   lidocaine  4 % patch Commonly known as: SALONPAS  2 patches, Transdermal, Every 24 hours, Apply patch to the most painful area for up to 12 hours in a 24 hours period. Refills: 0   magnesium  oxide 400 mg (241.3 mg magnesium ) tablet Commonly known as: MAG-OX  400 mg, Oral, 2 times Daily Refills: 0   melatonin 3 mg tablet  3 mg, Oral, Nightly Refills: 0   polyethylene glycol packet Commonly known as: MIRALAX  17 g, Oral, Daily PRN,  Mix in 4-8ounces of fluid prior to taking. Refills: 0   traZODone  50 MG tablet Commonly known as: DESYREL   50 mg, Oral, Nightly PRN Refills: 0   valGANciclovir  450 mg tablet Commonly known as: VALCYTE   900 mg, Oral, Daily with breakfast Refills: 0       Modified Medications      Details  atorvastatin  10 MG tablet Commonly known as: LIPITOR  What changed:  medication strength how much to take when to take this  10 mg, Oral, Nightly Refills: 0   insulin  GLARGINE pen injector (concentration 100 units/mL) Commonly known as: LANTUS  SOLOSTAR What changed:  how much to take when to take this  24 Units, Subcutaneous, Daily Quantity: 30 mL Refills: 2   insulin  LISPRO pen injector (concentration 100 units/mL) Commonly known as: HumaLOG  KWIKPEN What changed: additional instructions  Inject 6 units with breakfast, 8 units lunch, 6 units dinner. Refills: 0   OXcarbazepine  300 MG tablet Commonly known as: TRILEPTAL  What changed: additional instructions  Take 300 mg daily and 600 mg nightly. Refills: 0   pregabalin  75 MG capsule Commonly known as: LYRICA  What changed:  medication strength how much to take  75 mg, Oral, Nightly Refills: 0       Medications To Continue      Details  * blood glucose diagnostic test strip  1 strip, XX, 3 times Daily, Use as instructed. Quantity: 100 each Refills: 2   * blood glucose diagnostic test strip  1 strip, XX, 3 times Daily, Use as instructed. Quantity: 300 each Refills: 3   blood glucose meter kit  XX, As Directed Quantity: 1 each Refills: 0   ezetimibe  10 mg tablet Commonly known as: ZETIA   10 mg, Oral, Daily Quantity: 90 tablet Refills: 3   FREESTYLE LIBRE 2 READER Misc  USE DAILY Refills: 0   FREESTYLE LIBRE 2 SENSOR Kit  USE EVERY 14 (FOURTEEN) DAYS. Refills: 0   FUROsemide  20 MG tablet Commonly known as: LASIX   20 mg, Oral, Daily PRN Quantity: 30 tablet Refills: 3   glucagon 3 mg/actuation  nasal spray Commonly known as: BAQSIMI  3 mg, Intranasal, As Directed, 3 mg (one actuation) into a single nostril; if no response, may repeat in 15 minutes using a new intranasal device. Quantity: 1 each Refills: 1   lancing device Misc  1 each, Misc.(Non-Drug; Combo Route), 3 times Daily with meals Quantity: 100 each Refills: 2   levETIRAcetam  750 MG tablet Commonly known as: KEPPRA   1,500 mg, Oral, Every 12 hours Quantity: 360 tablet Refills: 3   pantoprazole  40 MG DR tablet Commonly known as: PROTONIX   40 mg, Oral, Daily Quantity: 30 tablet Refills: 11   pen needle, diabetic 31 gauge x 3/16 needle  Misc.(Non-Drug; Combo Route), As Directed Quantity: 100 each Refills: 0   predniSONE  5 MG tablet Commonly known as: DELTASONE   5 mg, Oral, Daily Quantity: 30 tablet Refills: 11   sennosides-docusate 8.6-50 mg tablet Commonly known as: SENOKOT-S  Take 1-2 tablets by mouth twice daily as needed. Quantity: 30 tablet Refills: 0      * * There are duplicate medications prescribed to the patient          Stopped Medications    amantadine HCL 100 mg capsule Commonly known as: SYMMETREL   amLODIPine 5 MG tablet Commonly known as: NORVASC   calcium  carbonate-vitamin D3 600 mg-10 mcg (400 unit) tablet Commonly known as: CALTRATE 600+D   DULoxetine  60 MG DR capsule Commonly known as: CYMBALTA    enoxaparin  40 mg/0.4 mL injection syringe Commonly known as: LOVENOX    mycophenolate 250 mg capsule Commonly known as: CELLCEPT    propranoloL 10 MG tablet Commonly known as: INDERAL   sulfamethoxazole -trimethoprim  400-80 mg tablet Commonly known as: BACTRIM  SS   tacrolimus  1 MG capsule Commonly known as: PROGRAF           Brief History of Present Illness: Per the H&P dated on 09/30/2023: Jason Stevenson is a 38 y.o. 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    The patient is seen at bedside with his wife, Jason Stevenson, who helps provide history. The patient recently discharged  from rehab after he suffered a hemorrhagic stroke in April.  Since he has been home, he has been doing well.  His wife states that he will nap once most days, but has overall continued to slowly improve.   About 3 days ago, the patient became more fatigued and required more frequent naps throughout the day.  His wife states that he was still able to do his normal activities, but just seemed a little bit more fatigued than normal.  On 6/17, the patient attempted to go to the restroom, but had a difficult time transferring due to weakness, which was more unusual. His wife checked his blood pressure and it was 88/71 and he had a subjective fever. She called EMS and his BP on repeat was 82/66.   He has consistently been taking all of his immunosuppressants without missed doses. He notes that for the last several days, he has been having some mild nausea without vomiting. He has also had diarrhea several times per day over the last week, but attributes this to a magnesium  supplement he has recently started taking. For the last 24 hours, he has had a mild headache. His wife reports that it has been several years since his last seizure.    They both deny significant confusion, vision changes, neck stiffness, cough, sore throat, abdominal pain, skin lesions, or new pain.    Notably, the patient has been CMV negative, but their heart donor was CMV positive. Last negative CMV PCR was 06/30/2023.   At [facility], the patient presented with tachycardia into the 130's, hypotension, Tmax 100.7 and satting 95% on 2L [place]. CBC notable for WBC 13.3, Hb 13.1, PLT 199. Chemistry with Na 136, K 5.0, Cl 103, BUN 38, sCR 2.2, glucose 233. Lactate 2.3, lipase 18, He was given  initial doses of cefepime , vancomycin , and flagyl . He received 1 L of fluid in the ED, and approximately 300cc en route before losing IV access.   On admission, vitals were T 37 C (98.6 F), HR (!) 122, BP 106/65, RR 20, and satting 93 % on 2L. Labs notable for Na 135, K grossly hemolyzed, HCO3 17*, BUN 38*, sCr 2.2*. CBC notable for WBC 13.5*, Hgb 12.1*, plts 202. Pt was seen at bedside immediately upon arrival to [facility] and was sweaty, clammy, but appropriate. _____________________  Hospital Course by Problem:  # OHT 11/26/22 # PEA arrest 6/18, likely respiratory etiology  # HTN  TTE prior to arrest with newly reduced EF 55% -> 40%, RV with severe dysfunction from mild previously. Suspect acute allograft dysfunction on admission likely 2/2 hypoxia/acute illness. RHC 7/2 with elevated L sided pressures and CI 2.8 off support; EMB 0R, insufficient sample for AMR testing. TTE 7/3 with normal function. No prior DSAs on last assessment 12/2022. Repeat HLA (7/22) after last pRBC (7/8) without DSA's. Switched from tacrolimus  to cyclosporine  given hemolysis and neuro changes. Continued prednisone . Held MMF. Completed Bactrim . Continued PPI. Started carvedilol  in place of home propranolol. Reduced atorvastatin  given interaction with new CYA. Resumed zetia . Held Ca/vit D given new phoslo . Ongoing discussions re: MMF outpatient.    # Hypoxic arrest 6/18 s/p VA ECMO --> transitioned to VV ECMO given intra-op hypoxemia on 6/21, decannulated 6/25 # ESBL Klebsiella HCAP # Bilateral pleural effusions s/p L pigtail 6/21 and R pigtail 6/30  Etiology of arrest suspected to be a respiratory etiology, possibly an unwitnessed seizure. Persistent fevers, tachycardia and worsening pulm opacities on CXR. BAL 7/3 grew ESBL Klebsiella. Treated with  a 2 week course of meropenem (end 7/20). TCTs tolerated and ultimately trach capped vs decannulated 8/4. R chest tube removed by CT surgery 7/22, and L chest tube 8/3.    # CMV  viremia  CMV PCR 109k 6/20. Started on induction dose ganciclovir. Transitioned to maintenance valcyte  7/26. Last CMV PCR < 260. Plan for 3 months maintenance therapy (~ 10/26) followed by q2 week CMV PCR upon cessation x 3 months.    # Thrush Completed Nystatin x14 days (end 7/14).   # Prior CVA with hemorrhagic conversion 2022 and 07/2023 # Concern for seizure Continued Keppra  and Trileptal .   # Anemia, multifactorial Required several transfusions (last 7/8). Received epo.    # LADA # Neuropathy Endocrine followed and adjusted insulin  regimen. At discharge, 24 units glargine, lispro 6-8-6. Continued Lyrica  at reduced dose.    # Depression Held Cymbalta , did not resume.    # Severe protein calorie malnutrition Nutrition diagnosed patient with severe protein calorie malnutrition and recommended enteral nutrition. Required TFs. SLP cleared patient for regular diet with thin liquids. TF/DHT removed prior to discharge.   Comorbid Conditions: Nutritional Disorders:    Hypoalbuminemia:    Hypoalbuminemia is associated with increased risk for patients.  We will attempt to treat the underlying condition(s) contributing to this low albumin state. Electrolyte Disorders:    Hyponatremia:   Will continue to monitor.     Hypernatremia:   Will continue to monitor.      Hyperkalemia:   Will continue to monitor.     Hypocalcemia:   Will continue to monitor.     Hypomagnesemia:  Hypomagnesemia present with lowest magnesium  of 1.6.  Magnesium  supplement administered.  Will continue to monitor.   Hematologic Disorders:    Anemia:  Anemia present with lowest hemoglobin of 9.  Will continue to monitor.         Imaging and Procedures Performed:   ECMO 6/18  CT brain 6/18 1.  No acute intracranial abnormalities.  2.  Chronic right MCA watershed distribution infarcts.  CT PE protocol 6/18 1.  No definite acute pulmonary embolus identified. Curvilinear intraluminal filling defect in RIGHT lower  lobe pulmonary artery may represent sequela from prior embolus of uncertain chronicity.  2.  Extensive bilateral consolidative and groundglass opacities with small pleural effusions, consistent with pulmonary edema. Superimposed infection is possible. 3.  Postsurgical changes of orthotopic heart transplantation. 4.  Please refer to separately dictated CT abdominopelvic study for additional findings.  CT AP 6/18 1.  No acute abnormality in the abdomen or pelvis 2.  Please refer to separately dictated same-day CT chest for findings above the diaphragm.  TTE 6/18 MODERATE LEFT VENTRICULAR SYSTOLIC DYSFUNCTION WITH MILD LVH ESTIMATED EF: 40% INDETERMINATE DIASTOLIC FUNCTION SEVERE RIGHT VENTRICULAR SYSTOLIC DYSFUNCTION VALVULAR REGURGITATION: No AR, No MR, TRIVIAL PR, No TR NO VALVULAR STENOSIS  CT abdomen angiogram 6/18 1.  Overall limited examination due to poor opacification of the infrarenal abdominal aorta and bilateral lower extremity arteries, likely related to contrast bolus timing in the setting of ECMO. Within this limitation, there is occlusion of the left common femoral artery and left proximal SFA just distal to the ECMO cannula entry site. There is distal reconstitution of the more distal SFA and profunda femoris arteries.  2.  Left SFA arterial line appears to be directed distally, correlate for appropriate positioning.  3.  The infrarenal abdominal aorta and the more proximal bilateral lower extremity arteries are poorly opacified but likely patent. 4.  Stable  small bilateral pleural effusions with findings suggestive of pulmonary edema. 5.  Please see separately dictated same day CT of the abdomen and pelvis for visceral findings.   Trach 6/25  CT CAP 6/26 1.  Findings of increased third spacing with body wall edema and trace ascites. 2.  Resolved left pleural effusion status post pigtail pleural drainage catheter placement. Moderate right pleural effusion has  mildly increased. 3.  Improved lung aeration, with residual multifocal groundglass and consolidative airspace opacities which may reflect pulmonary edema and/or multifocal pneumonia.  TTE 6/28 MILD LEFT VENTRICULAR SYSTOLIC DYSFUNCTION WITH MODERATE LVH ESTIMATED EF: 50% INDETERMINATE DIASTOLIC FUNCTION NORMAL RIGHT VENTRICULAR SYSTOLIC FUNCTION VALVULAR REGURGITATION: No AR, TRIVIAL MR, No PR, TRIVIAL TR ESTIMATED RVSP: 39 mmHg NO VALVULAR STENOSIS  TTE 6/29 MILD LEFT VENTRICULAR SYSTOLIC DYSFUNCTION WITH MILD LVH ESTIMATED EF: 50% INDETERMINATE DIASTOLIC FUNCTION NORMAL RIGHT VENTRICULAR SYSTOLIC FUNCTION VALVULAR REGURGITATION: No AR, TRIVIAL MR, MILD PR, MILD TR ESTIMATED RVSP: 38 mmHg NO VALVULAR STENOSIS  CT chest 6/29 1.  Increased areas of extensive consolidation and clustered nodules in both lungs, most likely aspiration/infection. Increased adjacent groundglass attenuation and septal thickening suggest increased superimposed pulmonary edema. 2.  Moderate right pleural effusion and trace left pleural effusion, unchanged. 3.  Unchanged mild mediastinal adenopathy, likely reactive.  CT brain 6/30 1.  No acute intracranial abnormalities.  2.  Chronic right MCA watershed distribution infarcts.  US  BUE 6/30 1.  No deep venous thrombosis identified in the bilateral upper extremities within the limitations described above.  2.  Superficial thrombus within the right basilic vein.  US  BLE 7/1 1. No deep vein thrombosis identified in the bilateral common femoral, femoral, and popliteal veins. 2. Limited evaluation of the bilateral posterior tibial and peroneal veins, with no definite evidence of thrombus in the visualized segments.  RHC/EMB 7/2 RA: 8 mmHg (mean) RV: 38/ 10 mmHg PA: 38/ 20 28 mmHg (mean) PCW: 24 mmHg (mean) AV O2: 4.7 vol% Cardiac output: 6.4 L/min Cardiac index: 2.8 L/min-m2 PVR: 0.6 Wood units Shunt Ratio: 1.0 (Qp/Qs) L to R shunt: 0.0 L/min  (Qp-Qep) R to L shunt: 0.0 L/min (Qs-Qep)  TTE 7/3 NORMAL LEFT VENTRICULAR SYSTOLIC FUNCTION WITH MILD LVH ESTIMATED EF: >55% DIASTOLIC FUNCTION CAN'T BE DETERMINED NORMAL RIGHT VENTRICULAR SYSTOLIC FUNCTION VALVULAR REGURGITATION: No AR, TRIVIAL MR, MILD PR, TRIVIAL TR NO VALVULAR STENOSIS  CT brain 7/18 1.  No acute intracranial abnormalities.  2.  Chronic right MCA infarcts.  CT chest 7/18 1.  Interval improvement in multifocal heterogenous opacities involving the left greater than right lungs most consistent with multifocal infection/aspiration. There is mild superimposed pulmonary edema. 2.  Trace left anterior pneumothorax with pigtail drain in place.  Lt Echo 7/29 NORMAL LEFT VENTRICULAR SYSTOLIC FUNCTION WITH MILD LVH ESTIMATED EF: >55%, CALC EF(2D): 70% NORMAL LA PRESSURES WITH NORMAL DIASTOLIC FUNCTION NORMAL RIGHT VENTRICULAR SYSTOLIC FUNCTION NO DOPPLER PERFORMED FOR VALVULAR REGURGITATION NO DOPPLER PERFORMED FOR VALVULAR STENOSIS ------------------------------------------------------------------------------------------ LIMITED ECHO TO ASSESS LV FUNCTION POOR SOUND TRANSMISSION   Compared with prior Echo study on 10/30/2023 : LVEF IMPROVED.   Last CXR 8/3 1.  Interval removal of the left pigtail catheter. Unchanged tracheostomy and right PICC 2.  Unchanged cardiomediastinal contours. 3.  Similar bilateral interstitial opacities which may reflect edema and atelectasis. 4.  No pneumothorax or large pleural effusion.  _____________________  Discharge Exam:  Admission Weight: 89.2 kg (196 lb 11.2 oz)  Discharge Weight: Weight: 98.7 kg (217 lb 9.5 oz) BMI: Body mass index is 30.46  kg/m. BP (!) 146/98 (BP Location: Right upper arm, Patient Position: Lying)   Pulse 101   Temp 36.9 C (98.5 F) (Axillary)   Resp 18   Ht 180 cm (5' 10.87)   Wt 98.7 kg (217 lb 9.5 oz)   SpO2 99%   BMI 30.46 kg/m   General: alert, cooperative, and in NAD Neuro: PERRL.  Speech clear. Oriented x4. Left extremities flaccid.   Respiratory: regular rate, symmetric, unlabored, clear to auscultation bilaterally, and no accessory muscle use. Anterior auscultation. Trach site with dressing. Cardiac: tachycardic, regular rhythm, S1, S2 present, no murmur, no rub, JVP low neck at 60 degrees Abdomen: normal bowel sounds, soft, nondistended, and mildly distended Extremities: extremities warm and well perfused, no clubbing or cyanosis, distal pulses intact, and edema in L ankle trace Lines: none  Pertinent Lab Testing: BMP: Recent Labs  Lab 11/24/23 0519  NA 135  K 4.1  CL 103  CO2 26  BUN 17  CREATININE 1.1  GLUCOSE 133  CALCIUM  9.0  MG 1.6*   CBC: Recent Labs  Lab 11/24/23 0519  WBC 3.6  HGB 9.0*  HCT 27.8*  PLT 298   INR: No results for input(s): INR in the last 168 hours.   FK: No results for input(s): FK506 in the last 168 hours.  CYA: Recent Labs    11/22/23 0410  CYA 216   CMV: Recent Labs  Lab 11/21/23 0412  CMVDNA DETECTED*  CMVLOG <=2.4*  CMVCOPY <=260*     Cholesterol: No results for input(s): CHOLTOTAL, LDLCALC, HDL, TRIG in the last 168 hours.     Other Pertinent Labs:  ____________________  Time spent on discharge process: >30 minutes  SANDRA LEI POND, PA  Attending Attestation:   I also discussed Mr. Antillon with the medical team and outpatient clinicians and spent >30 minutes on discharge planning and documentation.  I personally saw the patient and performed a substantive portion of this encounter in conjunction with the listed APP as documented above.  Juliene Bile, MD Advanced Heart Failure Team  *Some images could not be shown.

## 2023-10-23 DIAGNOSIS — R739 Hyperglycemia, unspecified: Secondary | ICD-10-CM | POA: Diagnosis not present

## 2023-10-23 DIAGNOSIS — T380X5A Adverse effect of glucocorticoids and synthetic analogues, initial encounter: Secondary | ICD-10-CM | POA: Diagnosis not present

## 2023-10-23 DIAGNOSIS — Z930 Tracheostomy status: Secondary | ICD-10-CM | POA: Diagnosis not present

## 2023-10-23 DIAGNOSIS — Z941 Heart transplant status: Secondary | ICD-10-CM | POA: Diagnosis not present

## 2023-10-23 DIAGNOSIS — R569 Unspecified convulsions: Secondary | ICD-10-CM | POA: Diagnosis not present

## 2023-10-24 DIAGNOSIS — G8918 Other acute postprocedural pain: Secondary | ICD-10-CM | POA: Diagnosis not present

## 2023-10-24 DIAGNOSIS — R918 Other nonspecific abnormal finding of lung field: Secondary | ICD-10-CM | POA: Diagnosis not present

## 2023-10-24 DIAGNOSIS — K7200 Acute and subacute hepatic failure without coma: Secondary | ICD-10-CM | POA: Diagnosis not present

## 2023-10-24 DIAGNOSIS — J811 Chronic pulmonary edema: Secondary | ICD-10-CM | POA: Diagnosis not present

## 2023-10-24 DIAGNOSIS — E119 Type 2 diabetes mellitus without complications: Secondary | ICD-10-CM | POA: Diagnosis not present

## 2023-10-24 DIAGNOSIS — I10 Essential (primary) hypertension: Secondary | ICD-10-CM | POA: Diagnosis not present

## 2023-10-24 DIAGNOSIS — D899 Disorder involving the immune mechanism, unspecified: Secondary | ICD-10-CM | POA: Diagnosis not present

## 2023-10-24 DIAGNOSIS — Z930 Tracheostomy status: Secondary | ICD-10-CM | POA: Diagnosis not present

## 2023-10-24 DIAGNOSIS — R569 Unspecified convulsions: Secondary | ICD-10-CM | POA: Diagnosis not present

## 2023-10-24 DIAGNOSIS — Z941 Heart transplant status: Secondary | ICD-10-CM | POA: Diagnosis not present

## 2023-10-24 DIAGNOSIS — J90 Pleural effusion, not elsewhere classified: Secondary | ICD-10-CM | POA: Diagnosis not present

## 2023-10-24 DIAGNOSIS — R739 Hyperglycemia, unspecified: Secondary | ICD-10-CM | POA: Diagnosis not present

## 2023-10-24 DIAGNOSIS — J9600 Acute respiratory failure, unspecified whether with hypoxia or hypercapnia: Secondary | ICD-10-CM | POA: Diagnosis not present

## 2023-10-24 DIAGNOSIS — Z452 Encounter for adjustment and management of vascular access device: Secondary | ICD-10-CM | POA: Diagnosis not present

## 2023-10-24 DIAGNOSIS — N179 Acute kidney failure, unspecified: Secondary | ICD-10-CM | POA: Diagnosis not present

## 2023-10-24 DIAGNOSIS — T380X5A Adverse effect of glucocorticoids and synthetic analogues, initial encounter: Secondary | ICD-10-CM | POA: Diagnosis not present

## 2023-10-24 DIAGNOSIS — D62 Acute posthemorrhagic anemia: Secondary | ICD-10-CM | POA: Diagnosis not present

## 2023-10-25 DIAGNOSIS — Z452 Encounter for adjustment and management of vascular access device: Secondary | ICD-10-CM | POA: Diagnosis not present

## 2023-10-25 DIAGNOSIS — R931 Abnormal findings on diagnostic imaging of heart and coronary circulation: Secondary | ICD-10-CM | POA: Diagnosis not present

## 2023-10-25 DIAGNOSIS — J90 Pleural effusion, not elsewhere classified: Secondary | ICD-10-CM | POA: Diagnosis not present

## 2023-10-25 DIAGNOSIS — Z941 Heart transplant status: Secondary | ICD-10-CM | POA: Diagnosis not present

## 2023-10-25 DIAGNOSIS — I469 Cardiac arrest, cause unspecified: Secondary | ICD-10-CM | POA: Diagnosis not present

## 2023-10-25 DIAGNOSIS — E139 Other specified diabetes mellitus without complications: Secondary | ICD-10-CM | POA: Diagnosis not present

## 2023-10-25 DIAGNOSIS — Z4682 Encounter for fitting and adjustment of non-vascular catheter: Secondary | ICD-10-CM | POA: Diagnosis not present

## 2023-10-25 DIAGNOSIS — R918 Other nonspecific abnormal finding of lung field: Secondary | ICD-10-CM | POA: Diagnosis not present

## 2023-10-26 DIAGNOSIS — Z930 Tracheostomy status: Secondary | ICD-10-CM | POA: Diagnosis not present

## 2023-10-26 DIAGNOSIS — D849 Immunodeficiency, unspecified: Secondary | ICD-10-CM | POA: Diagnosis not present

## 2023-10-26 DIAGNOSIS — R918 Other nonspecific abnormal finding of lung field: Secondary | ICD-10-CM | POA: Diagnosis not present

## 2023-10-26 DIAGNOSIS — Z941 Heart transplant status: Secondary | ICD-10-CM | POA: Diagnosis not present

## 2023-10-26 DIAGNOSIS — A498 Other bacterial infections of unspecified site: Secondary | ICD-10-CM | POA: Diagnosis not present

## 2023-10-26 DIAGNOSIS — R5081 Fever presenting with conditions classified elsewhere: Secondary | ICD-10-CM | POA: Diagnosis not present

## 2023-10-28 DIAGNOSIS — D849 Immunodeficiency, unspecified: Secondary | ICD-10-CM | POA: Diagnosis not present

## 2023-10-28 DIAGNOSIS — Z941 Heart transplant status: Secondary | ICD-10-CM | POA: Diagnosis not present

## 2023-10-28 DIAGNOSIS — A498 Other bacterial infections of unspecified site: Secondary | ICD-10-CM | POA: Diagnosis not present

## 2023-10-28 DIAGNOSIS — R1312 Dysphagia, oropharyngeal phase: Secondary | ICD-10-CM | POA: Diagnosis not present

## 2023-10-28 DIAGNOSIS — R5081 Fever presenting with conditions classified elsewhere: Secondary | ICD-10-CM | POA: Diagnosis not present

## 2023-10-28 DIAGNOSIS — J90 Pleural effusion, not elsewhere classified: Secondary | ICD-10-CM | POA: Diagnosis not present

## 2023-10-28 DIAGNOSIS — Z930 Tracheostomy status: Secondary | ICD-10-CM | POA: Diagnosis not present

## 2023-10-28 DIAGNOSIS — I469 Cardiac arrest, cause unspecified: Secondary | ICD-10-CM | POA: Diagnosis not present

## 2023-10-29 DIAGNOSIS — R739 Hyperglycemia, unspecified: Secondary | ICD-10-CM | POA: Diagnosis not present

## 2023-10-29 DIAGNOSIS — Z941 Heart transplant status: Secondary | ICD-10-CM | POA: Diagnosis not present

## 2023-10-29 DIAGNOSIS — T380X5A Adverse effect of glucocorticoids and synthetic analogues, initial encounter: Secondary | ICD-10-CM | POA: Diagnosis not present

## 2023-10-30 DIAGNOSIS — R918 Other nonspecific abnormal finding of lung field: Secondary | ICD-10-CM | POA: Diagnosis not present

## 2023-10-30 DIAGNOSIS — Z941 Heart transplant status: Secondary | ICD-10-CM | POA: Diagnosis not present

## 2023-10-31 DIAGNOSIS — G9340 Encephalopathy, unspecified: Secondary | ICD-10-CM | POA: Diagnosis not present

## 2023-10-31 DIAGNOSIS — I469 Cardiac arrest, cause unspecified: Secondary | ICD-10-CM | POA: Diagnosis not present

## 2023-10-31 DIAGNOSIS — J939 Pneumothorax, unspecified: Secondary | ICD-10-CM | POA: Diagnosis not present

## 2023-10-31 DIAGNOSIS — J811 Chronic pulmonary edema: Secondary | ICD-10-CM | POA: Diagnosis not present

## 2023-10-31 DIAGNOSIS — Z8673 Personal history of transient ischemic attack (TIA), and cerebral infarction without residual deficits: Secondary | ICD-10-CM | POA: Diagnosis not present

## 2023-10-31 DIAGNOSIS — R918 Other nonspecific abnormal finding of lung field: Secondary | ICD-10-CM | POA: Diagnosis not present

## 2023-11-02 DIAGNOSIS — R1312 Dysphagia, oropharyngeal phase: Secondary | ICD-10-CM | POA: Diagnosis not present

## 2023-11-03 DIAGNOSIS — R1312 Dysphagia, oropharyngeal phase: Secondary | ICD-10-CM | POA: Diagnosis not present

## 2023-11-04 DIAGNOSIS — Z452 Encounter for adjustment and management of vascular access device: Secondary | ICD-10-CM | POA: Diagnosis not present

## 2023-11-04 DIAGNOSIS — Z941 Heart transplant status: Secondary | ICD-10-CM | POA: Diagnosis not present

## 2023-11-04 DIAGNOSIS — E139 Other specified diabetes mellitus without complications: Secondary | ICD-10-CM | POA: Diagnosis not present

## 2023-11-06 DIAGNOSIS — R1312 Dysphagia, oropharyngeal phase: Secondary | ICD-10-CM | POA: Diagnosis not present

## 2023-11-06 DIAGNOSIS — E139 Other specified diabetes mellitus without complications: Secondary | ICD-10-CM | POA: Diagnosis not present

## 2023-11-07 DIAGNOSIS — R1312 Dysphagia, oropharyngeal phase: Secondary | ICD-10-CM | POA: Diagnosis not present

## 2023-11-07 DIAGNOSIS — E139 Other specified diabetes mellitus without complications: Secondary | ICD-10-CM | POA: Diagnosis not present

## 2023-11-08 DIAGNOSIS — Z930 Tracheostomy status: Secondary | ICD-10-CM | POA: Diagnosis not present

## 2023-11-08 DIAGNOSIS — Z941 Heart transplant status: Secondary | ICD-10-CM | POA: Diagnosis not present

## 2023-11-08 DIAGNOSIS — R1312 Dysphagia, oropharyngeal phase: Secondary | ICD-10-CM | POA: Diagnosis not present

## 2023-11-08 DIAGNOSIS — D849 Immunodeficiency, unspecified: Secondary | ICD-10-CM | POA: Diagnosis not present

## 2023-11-09 DIAGNOSIS — R1312 Dysphagia, oropharyngeal phase: Secondary | ICD-10-CM | POA: Diagnosis not present

## 2023-11-09 DIAGNOSIS — A419 Sepsis, unspecified organism: Secondary | ICD-10-CM | POA: Diagnosis not present

## 2023-11-09 DIAGNOSIS — Z4682 Encounter for fitting and adjustment of non-vascular catheter: Secondary | ICD-10-CM | POA: Diagnosis not present

## 2023-11-09 DIAGNOSIS — I959 Hypotension, unspecified: Secondary | ICD-10-CM | POA: Diagnosis not present

## 2023-11-09 DIAGNOSIS — Z941 Heart transplant status: Secondary | ICD-10-CM | POA: Diagnosis not present

## 2023-11-09 DIAGNOSIS — Z930 Tracheostomy status: Secondary | ICD-10-CM | POA: Diagnosis not present

## 2023-11-09 DIAGNOSIS — I469 Cardiac arrest, cause unspecified: Secondary | ICD-10-CM | POA: Diagnosis not present

## 2023-11-09 DIAGNOSIS — D849 Immunodeficiency, unspecified: Secondary | ICD-10-CM | POA: Diagnosis not present

## 2023-11-10 DIAGNOSIS — E139 Other specified diabetes mellitus without complications: Secondary | ICD-10-CM | POA: Diagnosis not present

## 2023-11-10 DIAGNOSIS — J90 Pleural effusion, not elsewhere classified: Secondary | ICD-10-CM | POA: Diagnosis not present

## 2023-11-10 DIAGNOSIS — I639 Cerebral infarction, unspecified: Secondary | ICD-10-CM | POA: Diagnosis not present

## 2023-11-10 DIAGNOSIS — I469 Cardiac arrest, cause unspecified: Secondary | ICD-10-CM | POA: Diagnosis not present

## 2023-11-10 DIAGNOSIS — Z941 Heart transplant status: Secondary | ICD-10-CM | POA: Diagnosis not present

## 2023-11-10 DIAGNOSIS — D849 Immunodeficiency, unspecified: Secondary | ICD-10-CM | POA: Diagnosis not present

## 2023-11-10 DIAGNOSIS — R1312 Dysphagia, oropharyngeal phase: Secondary | ICD-10-CM | POA: Diagnosis not present

## 2023-11-10 DIAGNOSIS — I6930 Unspecified sequelae of cerebral infarction: Secondary | ICD-10-CM | POA: Diagnosis not present

## 2023-11-11 DIAGNOSIS — D849 Immunodeficiency, unspecified: Secondary | ICD-10-CM | POA: Diagnosis not present

## 2023-11-11 DIAGNOSIS — I6930 Unspecified sequelae of cerebral infarction: Secondary | ICD-10-CM | POA: Diagnosis not present

## 2023-11-11 DIAGNOSIS — I469 Cardiac arrest, cause unspecified: Secondary | ICD-10-CM | POA: Diagnosis not present

## 2023-11-11 DIAGNOSIS — I639 Cerebral infarction, unspecified: Secondary | ICD-10-CM | POA: Diagnosis not present

## 2023-11-11 DIAGNOSIS — Z941 Heart transplant status: Secondary | ICD-10-CM | POA: Diagnosis not present

## 2023-11-11 DIAGNOSIS — R1312 Dysphagia, oropharyngeal phase: Secondary | ICD-10-CM | POA: Diagnosis not present

## 2023-11-11 DIAGNOSIS — J90 Pleural effusion, not elsewhere classified: Secondary | ICD-10-CM | POA: Diagnosis not present

## 2023-11-12 DIAGNOSIS — J9621 Acute and chronic respiratory failure with hypoxia: Secondary | ICD-10-CM | POA: Diagnosis not present

## 2023-11-12 DIAGNOSIS — I6930 Unspecified sequelae of cerebral infarction: Secondary | ICD-10-CM | POA: Diagnosis not present

## 2023-11-12 DIAGNOSIS — Z941 Heart transplant status: Secondary | ICD-10-CM | POA: Diagnosis not present

## 2023-11-12 DIAGNOSIS — J90 Pleural effusion, not elsewhere classified: Secondary | ICD-10-CM | POA: Diagnosis not present

## 2023-11-12 DIAGNOSIS — R1312 Dysphagia, oropharyngeal phase: Secondary | ICD-10-CM | POA: Diagnosis not present

## 2023-11-12 DIAGNOSIS — I469 Cardiac arrest, cause unspecified: Secondary | ICD-10-CM | POA: Diagnosis not present

## 2023-11-12 DIAGNOSIS — D849 Immunodeficiency, unspecified: Secondary | ICD-10-CM | POA: Diagnosis not present

## 2023-11-12 DIAGNOSIS — I639 Cerebral infarction, unspecified: Secondary | ICD-10-CM | POA: Diagnosis not present

## 2023-11-13 DIAGNOSIS — R918 Other nonspecific abnormal finding of lung field: Secondary | ICD-10-CM | POA: Diagnosis not present

## 2023-11-13 DIAGNOSIS — J90 Pleural effusion, not elsewhere classified: Secondary | ICD-10-CM | POA: Diagnosis not present

## 2023-11-13 DIAGNOSIS — I639 Cerebral infarction, unspecified: Secondary | ICD-10-CM | POA: Diagnosis not present

## 2023-11-13 DIAGNOSIS — Z941 Heart transplant status: Secondary | ICD-10-CM | POA: Diagnosis not present

## 2023-11-13 DIAGNOSIS — D849 Immunodeficiency, unspecified: Secondary | ICD-10-CM | POA: Diagnosis not present

## 2023-11-13 DIAGNOSIS — I6930 Unspecified sequelae of cerebral infarction: Secondary | ICD-10-CM | POA: Diagnosis not present

## 2023-11-13 DIAGNOSIS — R1312 Dysphagia, oropharyngeal phase: Secondary | ICD-10-CM | POA: Diagnosis not present

## 2023-11-13 DIAGNOSIS — Z452 Encounter for adjustment and management of vascular access device: Secondary | ICD-10-CM | POA: Diagnosis not present

## 2023-11-13 DIAGNOSIS — I469 Cardiac arrest, cause unspecified: Secondary | ICD-10-CM | POA: Diagnosis not present

## 2023-11-14 DIAGNOSIS — D849 Immunodeficiency, unspecified: Secondary | ICD-10-CM | POA: Diagnosis not present

## 2023-11-14 DIAGNOSIS — I69198 Other sequelae of nontraumatic intracerebral hemorrhage: Secondary | ICD-10-CM | POA: Diagnosis not present

## 2023-11-14 DIAGNOSIS — I6930 Unspecified sequelae of cerebral infarction: Secondary | ICD-10-CM | POA: Diagnosis not present

## 2023-11-14 DIAGNOSIS — I639 Cerebral infarction, unspecified: Secondary | ICD-10-CM | POA: Diagnosis not present

## 2023-11-14 DIAGNOSIS — Z941 Heart transplant status: Secondary | ICD-10-CM | POA: Diagnosis not present

## 2023-11-14 DIAGNOSIS — R261 Paralytic gait: Secondary | ICD-10-CM | POA: Diagnosis not present

## 2023-11-14 DIAGNOSIS — I469 Cardiac arrest, cause unspecified: Secondary | ICD-10-CM | POA: Diagnosis not present

## 2023-11-14 DIAGNOSIS — E139 Other specified diabetes mellitus without complications: Secondary | ICD-10-CM | POA: Diagnosis not present

## 2023-11-14 DIAGNOSIS — R1312 Dysphagia, oropharyngeal phase: Secondary | ICD-10-CM | POA: Diagnosis not present

## 2023-11-14 DIAGNOSIS — J90 Pleural effusion, not elsewhere classified: Secondary | ICD-10-CM | POA: Diagnosis not present

## 2023-11-15 DIAGNOSIS — D849 Immunodeficiency, unspecified: Secondary | ICD-10-CM | POA: Diagnosis not present

## 2023-11-15 DIAGNOSIS — Z941 Heart transplant status: Secondary | ICD-10-CM | POA: Diagnosis not present

## 2023-11-15 DIAGNOSIS — Z930 Tracheostomy status: Secondary | ICD-10-CM | POA: Diagnosis not present

## 2023-11-15 DIAGNOSIS — R1312 Dysphagia, oropharyngeal phase: Secondary | ICD-10-CM | POA: Diagnosis not present

## 2023-11-16 DIAGNOSIS — Z452 Encounter for adjustment and management of vascular access device: Secondary | ICD-10-CM | POA: Diagnosis not present

## 2023-11-16 DIAGNOSIS — E1165 Type 2 diabetes mellitus with hyperglycemia: Secondary | ICD-10-CM | POA: Diagnosis not present

## 2023-11-16 DIAGNOSIS — R918 Other nonspecific abnormal finding of lung field: Secondary | ICD-10-CM | POA: Diagnosis not present

## 2023-11-17 DIAGNOSIS — Z930 Tracheostomy status: Secondary | ICD-10-CM | POA: Diagnosis not present

## 2023-11-17 DIAGNOSIS — R1312 Dysphagia, oropharyngeal phase: Secondary | ICD-10-CM | POA: Diagnosis not present

## 2023-11-17 DIAGNOSIS — D849 Immunodeficiency, unspecified: Secondary | ICD-10-CM | POA: Diagnosis not present

## 2023-11-17 DIAGNOSIS — Z941 Heart transplant status: Secondary | ICD-10-CM | POA: Diagnosis not present

## 2023-11-17 DIAGNOSIS — E139 Other specified diabetes mellitus without complications: Secondary | ICD-10-CM | POA: Diagnosis not present

## 2023-11-18 DIAGNOSIS — I69198 Other sequelae of nontraumatic intracerebral hemorrhage: Secondary | ICD-10-CM | POA: Diagnosis not present

## 2023-11-18 DIAGNOSIS — Z941 Heart transplant status: Secondary | ICD-10-CM | POA: Diagnosis not present

## 2023-11-18 DIAGNOSIS — R261 Paralytic gait: Secondary | ICD-10-CM | POA: Diagnosis not present

## 2023-11-18 DIAGNOSIS — E139 Other specified diabetes mellitus without complications: Secondary | ICD-10-CM | POA: Diagnosis not present

## 2023-11-19 DIAGNOSIS — Z941 Heart transplant status: Secondary | ICD-10-CM | POA: Diagnosis not present

## 2023-11-19 DIAGNOSIS — E139 Other specified diabetes mellitus without complications: Secondary | ICD-10-CM | POA: Diagnosis not present

## 2023-11-19 NOTE — Progress Notes (Signed)
 Physical Therapy Progress Note  Patient Name:  Jason Stevenson Date of Therapy Session: 11/19/23 Time of Therapy Session:  1100 Duration of Session:  50 Minutes Room/Bed: 7309/7309-01  Precautions: Altered Mental Status, Falls Risk, Isolation Isolation Precautions: Contact   Assessment: Patient presents to PT today with continued functional mobility deficits but improving in activity tolerance and level of assistance. Patient required Min Ax2 for supine>sit transfers. Patient performed scooting transfer>recliner w/ sliding board, only requiring Min Ax2 for appropriate positioning and reducing friction; of note, he requires less assistance with this transfer than previous session. Patient was able to perform ~65min of seated therex bilaterally (hip flexion, knee extension, knee flexion, DF, heel raises, lateral weight shifting, assisted front arm lifts, and AAROM shoulder flexion), indicating increased seated activity tolerance. Patient was able to perform all LE exercises with LLE, except difficulty with DF and heel raises. Patient is continuing to improve in activity tolerance and requires less cueing and assistance w/ transfers, PT continues to recommend patient d/c to Acute rehab once medically appropriate. Will continue to follow patient as they remain in-house to address following impairments.   The patient will continue to benefit from the skills of a physical therapist to address Decreased strength, Decreased endurance/activity tolerance, Decreased transfers, Decreased bed mobility, Gait/Ambulation limitations, Impaired cognition, Impaired functional mobility.  Limiting factors to progression may include medical status.    Recommendations for mobility with nursing:  Use BMAT score and associated clinical judgement to determine safe mobility on a daily basis as patient status may be subject to change.  RN assist x2 for transfers, use [place] lift as needed.  Discharge Recommendations: Is  the patient safe to discharge to the recommended disposition? Yes Discharge Recommendations: Acute rehab  Harjot Dibello  is a great acute rehab candidate due to age, previously independent , has high level functional mobility goals, and highly motivated  Complete details of today's session: RN cleared and patient received VSS. At end of session, the patient was left seated in recliner at bedside, with all needs in reach, with nurse call device in reach, with bed/chair alarm system engaged. His status was communicated to the RN, OTA, Patient.    11/19/23 1100  Discipline Timestamp  Discipline Timestamp PT  Documentation Type     Documentation Type                                E,R, T   Treatment  Patient Subjective Information  Patient Subjective Information Patient agreeable to therapy  Precautions  Precautions Altered Mental Status;Falls Risk;Isolation  Isolation Precautions Contact  Patient/Family Goals  Patient/Family Goals Go home  Vital Signs  Heart Rate 98  Heart Rate Source Monitor  BP 120/85  MAP (mmHg) 96  BP Location Left upper arm  BP Method Automatic  Patient Position Lying  Pain Assessment  Pain Assessment %% 0-10  Pain Score %% Zero  Activity At Time Of Vitals Measurement  Activity Rest  Review of Systems Cardiovascular/Pulmonary Function  SpO2 99 %  Any supplemental oxygen? No  Mobility  Bed Mobility  Supine to Sit;Scooting       Scooting Assistance Moderate assist (pt. performs 50-75%)       Scooting Details Requires extra time;Set up;Verbal cues;Management of lines and tubes;Drawsheet / pad       Number of People Required 2       Supine to Sit Assistance Minimal assist (pt. performs > 75%)  Supine to Sit Details Requires extra time;Set up;Verbal cues       Number of People Required 2       Transfers Scooting transfer       Scooting Transfer Assistance Minimal assist (pt. performs > 75%)       Scooting Transfer Details from;hospital  bed;to;recliner;Requires extra time;Set up;Verbal cues;Management of lines and tubes       Number of people required 2       Scooting Transfer Assistive Device Transfer board  Adult PT Outcomes  Highest Level of Function Yes  Highest Level of Activity 4- Sitting edge of bed  Time in Highest Level of Activity 1-5 min  Adverse Events 0- None  Inpatient AM-PAC Performed Basic Mobility Inpatient Short Form - 6 Clicks  AM-PAC 6 Clicks Basic Mobility Inpatient Short Form  Turning from your back to your side while in a flat bed without using bedrails? 3-A Little  Moving from lying on your back to sitting on the side of a flat bed without using bedrails? 3-A Little  Moving to and from a bed to a chair (including a wheelchair)? 2-A Lot  Standing up from a chair using using your arms (e.g,. wheelchair, or bedside chair)? 2-A Lot  To walk in hospital room? 1-Total  Climbing 3-5 steps with a railing? 1-Total  AM-PAC Basic Mobility Raw Score 12  AM-PAC Basic Mobility t-Scale Score 32.23  AM-PAC Basic Mobility G-Code Modifier CL  Patient Status At End of Session  Status Communicated to: RN;OTA;Patient  Pt Left: seated in recliner at bedside;with all needs in reach;with nurse call device in reach;with bed/chair alarm system engaged  Assessment  PT Enhancers Good family support/resources;Motivated;Previous level of function/active lifestyle;Age  PT Barriers Impaired cognition;Decreased activity tolerance/medically complex/comorbidities;Supplemental oxygen needs;Decreased level of alertness  Impairments/Functional Limitations    Decreased strength;Decreased endurance/activity tolerance;Decreased transfers;Decreased bed mobility;Gait/Ambulation limitations;Impaired cognition;Impaired functional mobility  Rehab Potential   Good  Patient safe for DC/PT perspective? Yes  Summary of Findings  Tolerated treatment well;Patient is progressing slowly toward written goals secondary to  Patient is Progressing  Slowly Toward Written Goals Secondary to Medical status limitations;Decreased activity tolerance  Plan  Treatment/Interventions Therapeutic exercise;Patient and family education  PT Frequency 5x per week  Discharge Recommendation (DUH/DRH) Acute rehab  DME Recommendations Per accepting facility  Mobility aid                                         Wheelchair       Wheelchair type Manual  Hospital bed Semi-electric  Lift equipment Dependent  Plan(Progress Note) Continue current plan of care   Please see patient education record for PT teaching completed today.  MURTIS ATKINSON, PT Student     ------------------------------------------------------------------------------- Attestation with edits by Martell Domino, PT at 11/19/2023  4:13 PM I was present on site and immediately available during this session and agree with the documentation as written.   -------------------------------------------------------------------------------

## 2023-11-19 NOTE — PMR Pre-admission (Signed)
 PMR Admission Coordinator Pre-Admission Assessment  Patient: Jason Stevenson is an 38 y.o., male MRN: [id number] DOB: Feb 05, 1986 Height:   Weight:    Insurance Information HMO:     PPO:      PCP:      IPA:      80/20:      OTHER:  PRIMARY: [place] [employer]      Policy#:  [id number], Medicaid #: [id number]      Subscriber: Pt  approved 8.7.25 to 8.14.25, review due 8.14.25 CM Name:       Phone#: [phone]     Fax#: [phone] Pre-Cert#: [id number]       Employer:  Benefits:  Phone #:      Name:  Eff. Date: Effective: 09/14/2023 to 09/12/2024     Deduct:       Out of Pocket Max:       Life Max:  CIR:       SNF:  Outpatient:      Co-Pay:  Home Health:       Co-Pay:  DME:      Co-Pay:  Providers: in network   SECONDARY:       Policy#:       Phone#:   Artist:       Phone#:   The Data processing manager" for patients in Inpatient Rehabilitation Facilities with attached "Privacy Act Statement-Health Care Records" was provided and verbally reviewed with: N/A  Emergency Contact Information Contact Information     Name Relation Home Work Mobile   [place] Spouse   [phone]   Wilson,Rachel Sister [phone]        Other Contacts   None on File     Current Medical History  Patient Admitting Diagnosis: PEA arrest History of Present Illness: Jason Stevenson is a 38 y.o. 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 The patient had recently discharged [facility] in [place] where he completed rehab after he suffered a hemorrhagic stroke in April. At [facility], the patient presented with tachycardia into the 130's, hypotension, Tmax 100.7 and satting 95% on  2L [place]. CBC notable for WBC 13.3, Hb 13.1, PLT 199. Chemistry with Na 136, K 5.0, Cl 103, BUN 38, sCR 2.2, glucose 233. Lactate 2.3, lipase 18, He was given initial doses of cefepime , vancomycin , and flagyl . He received 1 L of fluid in the ED, and approximately 300cc en route before losing IV access. On admission, vitals were T 37 C (98.6 F), HR (!) 122, BP 106/65, RR 20, and satting 93 % on 2L. Labs notable for Na 135, K grossly hemolyzed, HCO3 17*, BUN 38*, sCr 2.2*. CBC notable for WBC 13.5*, Hgb 12.1*, plts 202.  He was ultimately admitted 09/30/23 with concern for sepsis.  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 RHC on  10/15/23 with  elevated L sided pressures, He had a follow up TTE 7/29 with normal bi-V function. He arrested on the floor (5 rounds CPR with ROSC)on 6/17 and then again 10/01/23 where [facility] was [facility]. He was decannulated  from [place] [facility] on 6/21; however was hypoxemic intra-op and [facility] Ecmo was deployed. Left Chest tube was placed for pleural effusion on 10/04/23. Tracheostomy was performed and [facility] ECMO decannulated on 10/08/23. Right pigtail catheter placed for pleural effusion on 6/30. [name] was decannulated 11/17/22. Pt. Initially with coretrak for nutrition. He passed FEES on 10/30/23 and has been on a regular diet with thin liquids. Dobhoff tube removed  11/12/23. Chest tubes were removed 11/16/2022. Pt. Seen by PT/OT/SLP and they recommend CIR to assist return to PLOF.      Patient's medical record from [facility]  has been reviewed by the rehabilitation admission coordinator and physician.  Past Medical History  Past Medical History:  Diagnosis Date   Chronic systolic CHF (congestive heart failure) (HCC) 06/18/2015   Hemorrhagic stroke (HCC)    Hyperlipidemia     Hypertension    Immune deficiency disorder (HCC)    Nonischemic cardiomyopathy (HCC) Noted as early as 2007   Per chart review (cards consult note 2011), EF of 40% in 2007, down to 20-25% in 2011    Has the patient had major surgery during 100 days prior to admission? Yes  Family History   family history includes Diabetes in his mother; Hypertension in his mother; Stroke in his maternal aunt and mother.  Current Medications  Current Outpatient Medications:    ACCU-CHEK GUIDE test strip, USE TO CHECK BLOOD SUGAR THREE TIMES DAILY., Disp: 100 each, Rfl: 2   acetaminophen  (TYLENOL ) 325 MG tablet, Take 1-2 tablets (325-650 mg total) by mouth every 4 (four) hours as needed for mild pain., Disp: , Rfl:    amLODipine (NORVASC) 5 MG tablet, Take 5 mg by mouth daily., Disp: , Rfl:    aspirin  EC 81 MG tablet, Take 81 mg by mouth daily. Swallow whole., Disp: , Rfl:    Blood Glucose Monitoring Suppl (ACCU-CHEK GUIDE ME) w/Device KIT, USE TO CHECK BLOOD SUGAR THREE TIMES DAILY., Disp: 1 kit, Rfl: 0   Blood Pressure Monitor DEVI, Use as directed to check home blood pressure 2-3 times a week, Disp: 1 Device, Rfl: 0   Calcium  Carbonate-Vitamin D (CALTRATE 600+D PO), Take 600 mg by mouth in the morning and at bedtime., Disp: , Rfl:    Continuous Glucose Sensor (FREESTYLE LIBRE 2 SENSOR) MISC, UAD, Disp: 6 each, Rfl: 1   DULoxetine  (CYMBALTA ) 60 MG capsule, Take 60 mg by mouth daily., Disp: , Rfl:    EASY COMFORT PEN NEEDLES 32G X 4 MM MISC, USE TO INJECT INSULIN  FOUR TIMES A DAY, Disp: 200 each, Rfl: 2   ezetimibe  (ZETIA ) 10 MG tablet, Take 10 mg by mouth daily., Disp: , Rfl:    furosemide  (LASIX ) 20 MG tablet, TAKE 1 TABLET (20 MG TOTAL) BY MOUTH 3 (THREE) TIMES A WEEK. MONDAYS WEDNESDAYS AND FRIDAY (Patient taking differently: Take 20 mg by mouth daily as needed for edema.), Disp: 30 tablet, Rfl: 11   GLUCAGON NA, Place 3 sprays into the nose once as needed., Disp: , Rfl:    insulin  glargine (LANTUS )  100 UNIT/ML injection, Inject 28 Units into the skin daily., Disp: , Rfl:    insulin  lispro (HUMALOG  KWIKPEN) 100 UNIT/ML KwikPen, Inject 14-16 Units into the skin. 14 am 16 noon and pm, Disp: , Rfl:    Insulin  Syringe-Needle U-100 (RELION INSULIN  SYRINGE) 31G X 15/64 0.3 ML MISC, Use to inject insulin  daily., Disp: 100 each, Rfl: 11   levETIRAcetam  (KEPPRA ) 750 MG tablet, TAKE 2 TABLETS BY MOUTH 2 (  TWO) TIMES DAILY (2AM+ 2EVENING), Disp: 360 tablet, Rfl: 1   magnesium  oxide (MAG-OX) 400 (240 Mg) MG tablet, Take 1 tablet by mouth 2 (two) times daily. (Patient not taking: Reported on 06/29/2023), Disp: , Rfl:    mycophenolate (CELLCEPT ) 250 MG capsule, Take 750 mg by mouth every 12 (twelve) hours., Disp: , Rfl:    Oxcarbazepine  (TRILEPTAL ) 300 MG tablet, TAKE 2 TABLETS (600 MG TOTAL) BY MOUTH 2 (TWO) TIMES DAILY (AM+EVENING), Disp: 360 tablet, Rfl: 1   pantoprazole  (PROTONIX ) 40 MG tablet, Take 40 mg by mouth daily., Disp: , Rfl:    predniSONE  (DELTASONE ) 5 MG tablet, Take 5 mg by mouth daily with breakfast. Take 1 tablets once a day, Disp: , Rfl:    pregabalin  (LYRICA ) 150 MG capsule, Take 150 mg by mouth at bedtime., Disp: , Rfl:    propranolol (INDERAL) 10 MG tablet, Take 10 mg by mouth 2 (two) times daily., Disp: , Rfl:    rosuvastatin  (CRESTOR ) 10 MG tablet, Take 20 mg by mouth daily., Disp: , Rfl:    senna-docusate (SENOKOT-S) 8.6-50 MG tablet, Take 1-2 tablets by mouth 2 (two) times daily as needed for mild constipation., Disp: , Rfl:    sulfamethoxazole -trimethoprim  (BACTRIM ) 400-80 MG tablet, Take 1 tablet by mouth daily., Disp: , Rfl:    tacrolimus  (PROGRAF ) 1 MG capsule, Take 5-6 mg by mouth See admin instructions. Take 6 capsules in the morning and 5 capsules every evening, Disp: , Rfl:    valGANciclovir  (VALCYTE ) 450 MG tablet, Take by mouth daily. Take 2 tablets by mouth every evening for 180 days (Patient not taking: Reported on 06/29/2023), Disp: , Rfl:   Patients Current Diet: Diet  Regular, Thin   Precautions / Restrictions Precautions: Fall    Has the patient had 2 or more falls or a fall with injury in the past year? Yes  Prior Activity Level Community (5-7x/wk): Prior to May, Pt. active in the community   Prior Functional Level Self Care: Did the patient need help bathing, dressing, using the toilet or eating? Independent  Indoor Mobility: Did the patient need assistance with walking from room to room (with or without device)? Independent  Stairs: Did the patient need assistance with internal or external stairs (with or without device)? Independent  Functional Cognition: Did the patient need help planning regular tasks such as shopping or remembering to take medications? Independent  Patient Information Are you of Hispanic, Latino/a,or Spanish origin?: A. No, not of Hispanic, Latino/a, or Spanish origin What is your race?: B. Black or African American Do you need or want an interpreter to communicate with a doctor or health care staff?: 0. No  Patient's Response To:  Health Literacy and Transportation Is the patient able to respond to health literacy and transportation needs?: Yes Health Literacy - How often do you need to have someone help you when you read instructions, pamphlets, or other written material from your doctor or pharmacy?: Never In the past 12 months, has lack of transportation kept you from medical appointments or from getting medications?: No In the past 12 months, has lack of transportation kept you from meetings, work, or from getting things needed for daily living?: No  Home Assistive Devices / Equipment    Prior Device Use: Indicate devices/aids used by the patient prior to current illness, exacerbation or injury? None of the above   Prior Functional Level Current Functional Level  Bed Mobility  Independent Mod assist   Transfers  Independent  Mod assist  Mobility - Walk/Wheelchair  Independent  Other   Upper Body  Dressing  Independent  Mod assist   Lower Body Dressing  Independent  Max assist   Grooming  Independent  Mod assist   Eating/Drinking  Independent  Mod assist   Toilet Transfer  Independent  Max assist   Bladder Continence   continent  Continent   Bowel Management  Continent  COntinent   Stair Climbing  Independent  Other   Communication  independent  Min A   Memory  independent min A     Special considerations/life events Continuous Drip IV  heparin , Skin intact, Special service needs none, and Bowel management continent  Previous Home Environment (from acute therapy documentation) Living Arrangements: Children; Spouse/significant other; Parent  Lives With: Spouse Available Help at Discharge: Family Type of Home: House Home Layout: One level Home Access: Ramped entrance Bathroom Shower/Tub: Nurse, adult Accessibility: Yes How Accessible: Accessible via wheelchair; Accessible via walker   Discharge Living Setting Plans for Discharge Living Setting: House Type of Home at Discharge: House Discharge Home Layout: Two level; Able to live on main level with bedroom/bathroom Alternate Level Stairs-Rails: Right; Left Alternate Level Stairs-Number of Steps: flight Discharge Home Access: Ramped entrance Discharge Bathroom Shower/Tub: Tub/shower unit Discharge Bathroom Toilet: Standard Discharge Bathroom Accessibility: No Does the patient have any problems obtaining your medications?: No   Social/Family/Support Systems Patient Roles: Spouse Contact Information: [phone] Anticipated Caregiver: [name] (wife) Anticipated Caregiver's Contact Information: Lives with many family members, wife works at home, someone always home and able to assist with transfers and personal care Ability/Limitations of Caregiver: min-mod A Caregiver Availability: 24/7 Discharge Plan Discussed with Primary Caregiver: Yes Is Caregiver In Agreement with Plan?:  Yes Does Caregiver/Family have Issues with Lodging/Transportation while Pt is in Rehab?: No   Goals Patient/Family Goal for Rehab: PT/OT Min A Expected length of stay: 18-21 days Pt/Family Agrees to Admission and willing to participate: Yes Program Orientation Provided & Reviewed with Pt/Caregiver Including Roles  & Responsibilities: Yes   Decrease burden of Care through IP rehab admission: Not anticipated  Possible need for SNF placement upon discharge: Not anticipated  Patient Condition: I have reviewed medical records from [facility] , spoken with CM, and patient and spouse. I met with patient at the bedside for inpatient rehabilitation assessment.  Patient will benefit from ongoing PT, OT, and SLP, can actively participate in 3 hours of therapy a day 5 days of the week, and can make measurable gains during the admission.  Patient will also benefit from the coordinated team approach during an Inpatient Acute Rehabilitation admission.  The patient will receive intensive therapy as well as Rehabilitation physician, nursing, social worker, and care management interventions.  Due to bladder management, bowel management, safety, skin/wound care, disease management, medication administration, pain management, and patient education the patient requires 24 hour a day rehabilitation nursing.  The patient is currently mod-max A with mobility and basic ADLs.  Discharge setting and therapy post discharge at home with home health is anticipated.  Patient has agreed to participate in the Acute Inpatient Rehabilitation Program and will admit today.  Preadmission Screen Completed By:  Leita KATHEE Kleine, 11/19/2023 3:34 PM ______________________________________________________________________   Discussed status with Dr. Carilyn  on 11/24/23 at 954 and received approval for admission today.  Admission Coordinator:  Leita KATHEE Kleine, CCC-SLP, time 955/Date 11/24/23  Assessment/Plan: Diagnosis:  debility due to cardiac arrest Does the need for close, 24 hr/day Medical supervision in concert with  the patient's rehab needs make it unreasonable for this patient to be served in a less intensive setting? Yes Co-Morbidities requiring supervision/potential complications: Hx ICH with hemiparesis, HTN, HLD, Pleural effusion Due to bladder management, bowel management, safety, skin/wound care, disease management, medication administration, pain management, and patient education, does the patient require 24 hr/day rehab nursing? Yes Does the patient require coordinated care of a physician, rehab nurse, PT, OT, and SLP to address physical and functional deficits in the context of the above medical diagnosis(es)? Yes Addressing deficits in the following areas: balance, endurance, locomotion, strength, transferring, bowel/bladder control, bathing, dressing, toileting, and psychosocial support Can the patient actively participate in an intensive therapy program of at least 3 hrs of therapy 5 days a week? Yes The potential for patient to make measurable gains while on inpatient rehab is good Anticipated functional outcomes upon discharge from inpatient rehab: min assist PT, min assist OT, supervision SLP Estimated rehab length of stay to reach the above functional goals is: 18-21d Anticipated discharge destination: home with 24/7 assist from wife 10. Overall Rehab/Functional Prognosis: good  MD Signature Prentice CHARLENA Compton M.D. [facility] Fellow Am Acad of Phys Med and Rehab Diplomate Am Board of Electrodiagnostic Med Fellow Am Board of Interventional Pain

## 2023-11-20 DIAGNOSIS — Z941 Heart transplant status: Secondary | ICD-10-CM | POA: Diagnosis not present

## 2023-11-20 DIAGNOSIS — E139 Other specified diabetes mellitus without complications: Secondary | ICD-10-CM | POA: Diagnosis not present

## 2023-11-20 NOTE — Progress Notes (Signed)
 Cardiology Progress Note    11/20/2023 Hospital Day: 6  Jason Stevenson is a 38 y.o.male who was initially admitted to OSH with lethargy, worsening left sided weakness and c/f sepsis on 6/17. He was transferred to [facility] after arrested on floor (5 rounds CPR w/ ROSC) and ultimately arrested again on 6/18 where [facility] ECMO deployed. Decannulated from [facility] ECMO on 6/21; However, hypoxemic intra-op and VV ECMO deployed. Left chest tube placed for pleural effusion intra-op (6/21). Trach on 6/25 and VV ECMO decannulation. Right pigtail placed for pleural effusion paced on 7W (6/30). Decannulated 8/4. Working on Scientist, physiological.   Subjective:   No acute events overnight. No complaints today.  Objective:   Vital signs in last 24 hours: Temp:  [36.7 C (98.1 F)-37.2 C (98.9 F)] 36.7 C (98.1 F) Heart Rate:  [92-99] 92 Resp:  [16-20] 16 BP: (102-140)/(72-106) 102/72 FiO2 (%):  [21 %] 21 % SpO2: 97 % Last BM Date: 11/19/23  Weights: Last weight: (!) 100.9 kg (222 lb 8 oz)    First weight: 89.2 kg (196 lb 11.2 oz) BMI: Body mass index is 31.15 kg/m.  24-Hour Intake/Output: I/O last 2 completed shifts: In: 720 [P.O.:720] Out: 440 [Urine:440]  Telemetry: SR/ST 90-100s  Physical Exam:  General: alert, cooperative, and in NAD Neuro: PERRL. Speech clear. Oriented x4. Left extremities flaccid.   Respiratory: regular rate, symmetric, unlabored, clear to auscultation bilaterally, and no accessory muscle use. Anterior auscultation. Trach site with dressing. Cardiac: tachycardic, regular rhythm, S1, S2 present, no murmur, no rub, JVP low neck at 60 degrees Abdomen: normal bowel sounds, soft, nondistended, and mildly distended Extremities: extremities warm and well perfused, no clubbing or cyanosis, distal pulses intact, and edema in L ankle trace Lines: PIV, PICC  Labs:  BMP: Recent Labs  Lab 11/20/23 0513  NA 138  K 4.3  CL 105  CO2 25  BUN 17  CREATININE 1.0  GLUCOSE 116   CALCIUM  9.5  MG 1.6*   CBC: Recent Labs  Lab 11/20/23 0513  WBC 3.4  HGB 10.1*  HCT 30.7*  PLT 317   INR: No results for input(s): INR in the last 168 hours.   FK: No results for input(s): FK506 in the last 168 hours. CYA: Recent Labs    11/18/23 0558  CYA 134   LDH: No results for input(s): LDH in the last 168 hours.        Scheduled Medications: .  acetaminophen , 975 mg, Oral, Q6H PRN .  atorvastatin , 10 mg, Oral, QHS .  budesonide , 1 mg, Nebulization, BID RT .  calcium  acetate(phosphat bind), 1,334 mg, Oral, BID CC .  carboxymethylcellulose, 2 drop, Both Eyes, TID PRN .  carvediloL , 12.5 mg, Oral, BID CC .  cycloSPORINE  modified, 125 mg, Oral, QHS **AND** cycloSPORINE  modified, 125 mg, Oral, Daily .  dextrose  50% in water, 12.5-25 g, Intravenous, As Directed .  diclofenac , 2 g, Topical, QID PRN .  ezetimibe , 10 mg, Oral, Daily .  glucagon, 1 mg, Subcutaneous, As Directed .  heparin  (porcine), 5,000 Units, Subcutaneous, Q8H SCH .  insulin  GLARGINE-yfgn, 26 Units, Subcutaneous, Daily 1800 .  insulin  LISPRO (AdmeLOG , HumaLOG ) injection, 6 Units, Subcutaneous, Daily with breakfast .  insulin  LISPRO (AdmeLOG , HumaLOG ) injection, 6 Units, Subcutaneous, Daily with dinner .  insulin  LISPRO (AdmeLOG , HumaLOG ) injection, 8 Units, Subcutaneous, Daily with lunch .  insulin  LISPRO (AdmeLOG , HumaLOG ) injection, 0-3 Units, Subcutaneous, TID CC .  ipratropium-albuteroL , 3 mL, Nebulization, BID PRN .  levETIRAcetam , 1,500  mg, Oral, Q12H SCH .  lidocaine , 2 patch, Transdermal, Q24H .  melatonin, 3 mg, Oral, QHS .  OXcarbazepine , 300 mg, Oral, Daily **AND** OXcarbazepine , 600 mg, Oral, QHS .  pantoprazole , 40 mg, Oral, Daily .  polyethylene glycol, 17 g, Oral, Daily PRN .  predniSONE , 5 mg, Oral, Daily .  pregabalin , 75 mg, Oral, QHS .  sennosides-docusate, 2 tablet, Oral, BID PRN .  sulfamethoxazole -trimethoprim , 1 tablet, Oral, Daily .  traZODone , 50 mg, Oral, QHS .   valGANciclovir , 900 mg, Oral, Daily with breakfast  Assessment/Plan:   Principal Problem:   Heart transplant, orthotopic, status (CMS/HHS-HCC) Active Problems:   Mixed dyslipidemia   Essential hypertension   Poorly controlled type 2 diabetes mellitus with circulatory disorder (CMS/HHS-HCC)   Seizure (CMS/HHS-HCC)   Immunosuppressed status (CMS/HHS-HCC)   Anemia of chronic disease   Right ventricular dysfunction   On enteral nutrition   CVA (cerebral vascular accident) (CMS/HHS-HCC)   Cardiac arrest (CMS/HHS-HCC)   Acute on chronic respiratory failure with hypoxia (CMS/HHS-HCC)   Pleural effusion on right   Cytomegalovirus (CMV) viremia (CMS/HHS-HCC)   Fever   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 Suspect acute allograft dysfunction on admission likely 2/2 hypoxia/acute illness. RHC 7/2 with elevated L sided pressures and CI 2.8 off support; EMB 0R, insufficient sample for AMR testing. Last TTE 7/29 with normal bi-V function. HLAs 7/18 without DSA's.  - CYA 8/5 134 (goal 150-200). Continue current CYA 125+125 - Continue prednisone  - Plan to defer MMF for the time being given significant infectious complications this admission.  - Continue Bactrim  (end 11/26/2023) - Continue carvedilol  - Resumed atorvastatin  8/4. Will reduce dose to 10mg  daily given new CYA. Continue zetia   - PRN diuretic, last dose IV Bumex 2mg  on 7/13   # Hypoxic arrest 6/18 s/p [facility] ECMO --> transitioned to VV ECMO given intra-op hypoxemia on 6/21, decannulated 6/25 # ESBL Klebsiella HCAP # Bilateral pleural effusions s/p L pigtail 6/21 and R pigtail 6/30  Etiology of arrest suspected to be a respiratory etiology, possibly  an unwitnessed seizure. Persistent fevers, tachycardia and worsening pulmonary opacities on CXR. BAL 7/3 grew ESBL Klebsiella. BCx negative. s/p 2 week course of meropenem (ended 7/20). Required bilateral pigtail drains for pleural effusions. Both removed d/c'd, last 8/3. Trach capped 7/30, decannulated 8/4.  - Continue IS and pulmonary toilet. On room air   # CMV viremia  CMV PCR 109k 6/20. 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 EEG negative this admission. - Continue Keppra  and Trileptal    # Anemia, multifactorial Required several transfusions (last 7/8).  - H/H stable - CBC every other day    # LADA # Neuropathy - Insulin  per endocrine - Continue Lyrica    # Depression - Holding Cymbalta  since admission for now    # Severe protein calorie malnutrition # Hyperphosphatemia Required enteral nutrition initially. Passed FEES 7/15. Stopped TF and removed DHT 7/30. -  Nutrition following,  - SLP following. Continue regular diet with thin liquids. Requires assistance and 100% supervision.  - Phos has been elevated ~5-6. Started Phoslo  7/28. Suspect after TF discontinuation that Phos will correct to WNL and will be able to discontinue Phoslo  with time.   Comorbid Conditions: Nutritional Disorders:    Hypoalbuminemia:  Hypoalbuminemia present with lowest albumin of 2.7.   Hypoalbuminemia is associated with increased risk for patients.  We will attempt to treat the underlying condition(s) contributing to this low albumin state. Electrolyte Disorders:    Hyponatremia:   Will continue to monitor.     Hyperkalemia:   Will continue to monitor.     Hypocalcemia:   Will continue to monitor.     Hypomagnesemia:   Hypomagnesemia present with lowest magnesium  of 1.6.   Will continue to monitor.   Hematologic Disorders:    Anemia:  Anemia present with lowest hemoglobin of 10.1.  Will continue to monitor.          Code Status: Full Code VTE Prophylaxis:  VTE Prophylaxis  + Anticoagulant & Anti-Embolism Compression Device Ordered  heparin  (porcine), 5,000 Units, Subcutaneous, Q8H SCH, 5,000 Units at 11/20/23 0914       Discharge Planning: hopeful to discharge to acute rehab    [facility] [employer], NP-C  ------------------------------------------------------------------------------- Attestation signed by Tobie Nanci Baptise, MD at 11/20/2023  8:25 PM Attestation Statement:   I personally saw the patient and performed a substantive portion of the medical decision making, in conjunction with the Advanced Practice Provider for the condition/treatment of OHT, DM, cardiac arrest, CMV.    Good progress w rehab, appreciate PT/OT help.   Immunosuppression w CyA, steroids (MMF held for infection), adjusting based on levels.  Working toward acute rehab discharge next week   CHETAN B PATEL, MD -------------------------------------------------------------------------------

## 2023-11-21 DIAGNOSIS — I469 Cardiac arrest, cause unspecified: Secondary | ICD-10-CM | POA: Diagnosis not present

## 2023-11-21 DIAGNOSIS — R1312 Dysphagia, oropharyngeal phase: Secondary | ICD-10-CM | POA: Diagnosis not present

## 2023-11-21 DIAGNOSIS — I639 Cerebral infarction, unspecified: Secondary | ICD-10-CM | POA: Diagnosis not present

## 2023-11-21 DIAGNOSIS — I6930 Unspecified sequelae of cerebral infarction: Secondary | ICD-10-CM | POA: Diagnosis not present

## 2023-11-21 DIAGNOSIS — J90 Pleural effusion, not elsewhere classified: Secondary | ICD-10-CM | POA: Diagnosis not present

## 2023-11-21 DIAGNOSIS — Z941 Heart transplant status: Secondary | ICD-10-CM | POA: Diagnosis not present

## 2023-11-21 DIAGNOSIS — D849 Immunodeficiency, unspecified: Secondary | ICD-10-CM | POA: Diagnosis not present

## 2023-11-21 NOTE — Progress Notes (Signed)
 Cardiology Progress Note    11/21/2023 Hospital Day: 28  Jason Stevenson is a 38 y.o.male who was initially admitted to OSH with lethargy, worsening left sided weakness and c/f sepsis on 6/17. He was transferred to [facility] after arrested on floor (5 rounds CPR w/ ROSC) and ultimately arrested again on 6/18 where [facility] ECMO deployed. Decannulated from [facility] ECMO on 6/21; However, hypoxemic intra-op and VV ECMO deployed. Left chest tube placed for pleural effusion intra-op (6/21). Trach on 6/25 and VV ECMO decannulation. Right pigtail placed for pleural effusion paced on 7W (6/30). Decannulated 8/4. Working on Scientist, physiological.   Subjective:   No acute events overnight. No complaints today.  Objective:   Vital signs in last 24 hours: Temp:  [36.7 C (98 F)-37.1 C (98.8 F)] 36.8 C (98.2 F) Heart Rate:  [89-100] 97 Resp:  [16-18] 16 BP: (107-130)/(74-95) 129/92 FiO2 (%):  [21 %] 21 % SpO2: 98 % Last BM Date: 11/20/23  Weights: Last weight: (!) 101 kg (222 lb 11.2 oz)    First weight: 89.2 kg (196 lb 11.2 oz) BMI: Body mass index is 31.18 kg/m.  24-Hour Intake/Output: I/O last 2 completed shifts: In: 1200 [P.O.:1200] Out: 500 [Urine:500]  Telemetry: SR/ST 90-100s  Physical Exam:  General: alert, cooperative, and in NAD Neuro: PERRL. Speech clear. Oriented x4. Left extremities flaccid.   Respiratory: regular rate, symmetric, unlabored, clear to auscultation bilaterally, and no accessory muscle use. Anterior auscultation. Trach site with dressing. Cardiac: tachycardic, regular rhythm, S1, S2 present, no murmur, no rub, JVP low neck at 60 degrees Abdomen: normal bowel sounds, soft, nondistended, and mildly distended Extremities: extremities warm and well perfused, no clubbing or cyanosis, distal pulses intact, and edema in L ankle trace Lines: PIV, PICC  Labs:  BMP: Recent Labs  Lab 11/21/23 0412  NA 134*  K 4.1  CL 100  CO2 24  BUN 18  CREATININE 1.1  GLUCOSE  158*  CALCIUM  9.1  MG 1.4*   CBC: Recent Labs  Lab 11/20/23 0513  WBC 3.4  HGB 10.1*  HCT 30.7*  PLT 317   INR: No results for input(s): INR in the last 168 hours.   FK: No results for input(s): FK506 in the last 168 hours. CYA: Recent Labs    11/20/23 0513  CYA 308*   LDH: No results for input(s): LDH in the last 168 hours.        Scheduled Medications: .  acetaminophen , 975 mg, Oral, Q6H PRN .  atorvastatin , 10 mg, Oral, QHS .  budesonide , 1 mg, Nebulization, BID RT .  calcium  acetate(phosphat bind), 1,334 mg, Oral, BID CC .  carboxymethylcellulose, 2 drop, Both Eyes, TID PRN .  carvediloL , 12.5 mg, Oral, BID CC .  cycloSPORINE  modified, 125 mg, Oral, QHS **AND** cycloSPORINE  modified, 125 mg, Oral, Daily .  dextrose  50% in water, 12.5-25 g, Intravenous, As Directed .  diclofenac , 2 g, Topical, QID PRN .  ezetimibe , 10 mg, Oral, Daily .  glucagon, 1 mg, Subcutaneous, As Directed .  heparin  (porcine), 5,000 Units, Subcutaneous, Q8H SCH .  insulin  GLARGINE-yfgn, 26 Units, Subcutaneous, Daily 1800 .  insulin  LISPRO (AdmeLOG , HumaLOG ) injection, 6 Units, Subcutaneous, Daily with breakfast .  insulin  LISPRO (AdmeLOG , HumaLOG ) injection, 6 Units, Subcutaneous, Daily with dinner .  insulin  LISPRO (AdmeLOG , HumaLOG ) injection, 8 Units, Subcutaneous, Daily with lunch .  insulin  LISPRO (AdmeLOG , HumaLOG ) injection, 0-3 Units, Subcutaneous, TID CC .  ipratropium-albuteroL , 3 mL, Nebulization, BID PRN .  levETIRAcetam , 1,500  mg, Oral, Q12H SCH .  lidocaine , 2 patch, Transdermal, Q24H .  melatonin, 3 mg, Oral, QHS .  OXcarbazepine , 300 mg, Oral, Daily **AND** OXcarbazepine , 600 mg, Oral, QHS .  pantoprazole , 40 mg, Oral, Daily .  polyethylene glycol, 17 g, Oral, Daily PRN .  predniSONE , 5 mg, Oral, Daily .  pregabalin , 75 mg, Oral, QHS .  sennosides-docusate, 2 tablet, Oral, BID PRN .  sulfamethoxazole -trimethoprim , 1 tablet, Oral, Daily .  traZODone , 50 mg, Oral,  QHS .  valGANciclovir , 900 mg, Oral, Daily with breakfast  Assessment/Plan:   Principal Problem:   Heart transplant, orthotopic, status (CMS/HHS-HCC) Active Problems:   Mixed dyslipidemia   Essential hypertension   Poorly controlled type 2 diabetes mellitus with circulatory disorder (CMS/HHS-HCC)   Seizure (CMS/HHS-HCC)   Immunosuppressed status (CMS/HHS-HCC)   Anemia of chronic disease   Right ventricular dysfunction   On enteral nutrition   CVA (cerebral vascular accident) (CMS/HHS-HCC)   Cardiac arrest (CMS/HHS-HCC)   Acute on chronic respiratory failure with hypoxia (CMS/HHS-HCC)   Pleural effusion on right   Cytomegalovirus (CMV) viremia (CMS/HHS-HCC)   Fever   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 Suspect acute allograft dysfunction on admission likely 2/2 hypoxia/acute illness. RHC 7/2 with elevated L sided pressures and CI 2.8 off support; EMB 0R, insufficient sample for AMR testing. Last TTE 7/29 with normal bi-V function. HLAs 7/18 without DSA's.  - CYA 8/8 196 (goal 150-200). Continue current CYA 125+125 - Continue prednisone  - Plan to defer MMF for the time being given significant infectious complications this admission.  - Continue Bactrim  (end 11/26/2023) - Continue carvedilol  - Resumed atorvastatin  8/4. Will reduce dose to 10mg  daily given new CYA. Continue zetia   - PRN diuretic, last dose IV Bumex 2mg  on 7/13   # Hypoxic arrest 6/18 s/p [facility] ECMO --> transitioned to VV ECMO given intra-op hypoxemia on 6/21, decannulated 6/25 # ESBL Klebsiella HCAP # Bilateral pleural effusions s/p L pigtail 6/21 and R pigtail 6/30  Etiology of arrest suspected to be a respiratory etiology,  possibly an unwitnessed seizure. Persistent fevers, tachycardia and worsening pulmonary opacities on CXR. BAL 7/3 grew ESBL Klebsiella. BCx negative. s/p 2 week course of meropenem (ended 7/20). Required bilateral pigtail drains for pleural effusions. Both removed d/c'd, last 8/3. Trach capped 7/30, decannulated 8/4.  - Continue IS and pulmonary toilet. On room air   # CMV viremia  CMV PCR 109k 6/20. 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 EEG negative this admission. - Continue Keppra  and Trileptal    # Anemia, multifactorial Required several transfusions (last 7/8).  - H/H stable - CBC every other day    # LADA # Neuropathy - Insulin  per endocrine - Continue Lyrica    # Depression - Holding Cymbalta  since admission for now    # Severe protein calorie malnutrition # Hyperphosphatemia Required enteral nutrition initially. Passed FEES 7/15. Stopped TF and removed DHT 7/30. -  Nutrition following,  - SLP following. Continue regular diet with thin liquids. Requires assistance and 100% supervision.  - Phos has been elevated ~5-6. Started Phoslo  7/28. Suspect after TF discontinuation that Phos will correct to WNL and will be able to discontinue Phoslo  with time.   Comorbid Conditions: Nutritional Disorders:    Hypoalbuminemia:    Hypoalbuminemia is associated with increased risk for patients.  We will attempt to treat the underlying condition(s) contributing to this low albumin state. Electrolyte Disorders:    Hyponatremia:  Hyponatremia present with lowest sodium of 134.  Will continue to monitor.     Hyperkalemia:   Will continue to monitor.     Hypocalcemia:   Will continue to monitor.     Hypomagnesemia:   Hypomagnesemia present with lowest magnesium  of 1.4.   Will continue to monitor.   Hematologic Disorders:    Anemia:   Will continue to monitor.          Code Status: Full Code VTE Prophylaxis:  VTE Prophylaxis  + Anticoagulant & Anti-Embolism Compression Device Ordered  heparin  (porcine), 5,000 Units, Subcutaneous, Q8H SCH, 5,000 Units at 11/20/23 2349       Discharge Planning: Plan to discharge to acute rehab Monday   [facility] [employer], NP-C  ------------------------------------------------------------------------------- Attestation signed by Brutus Odor, MD at 11/21/2023  2:48 PM Attestation Statement:   I personally saw the patient and performed a substantive portion of the medical decision making, in conjunction with the Advanced Practice Provider for the condition/treatment of OHT, DM, cardiac arrest, CMV. Trach decannulated and R pigtail removed. Continues to work on nutrition and rehab  No issues overnight  Plan for acute rehab in [place] next week  CSA level at goal   Follow up CMV  ODOR BRUTUS, MD  -------------------------------------------------------------------------------

## 2023-11-21 NOTE — Progress Notes (Signed)
 Physical Therapy Progress Note  Patient Name:  Jason Stevenson Date of Therapy Session: 11/21/23 Time of Therapy Session:  1429 Duration of Session:  35 Minutes Room/Bed: 7309/7309-01  Precautions: Falls Risk, Isolation Isolation Precautions: Contact   Assessment: Jason Stevenson tolerated session well this date, able to stand and pivot transfer to recliner with moderate assist x2 persons. Benefited from tactile cue to L knee flexors to initiate backwards step for pivoting motion. He demonstrated this ability after completing hooklying bridges and SLR prior to transferring to edge of bed. Some blocking to feet required in hooklying position, though excellent glute strength demonstrated. Reviewed positioning of LUE with sling and pillow to control subluxation; 2 finger separation noted. Continue to recommend discharge to acute rehab, as he remains an excellent rehab candidate. The patient will continue to benefit from the skills of a physical therapist to address Decreased strength, Decreased endurance/activity tolerance, Decreased transfers, Decreased bed mobility, Gait/Ambulation limitations, Impaired cognition, Impaired functional mobility.  Limiting factors to progression may include activity tolerance.    Recommendations for mobility with nursing:  Use BMAT score and associated clinical judgement to determine safe mobility on a daily basis as patient status may be subject to change.  X2 assist to transfer or use hoyer lift  Discharge Recommendations: Is the patient safe to discharge to the recommended disposition? Yes Discharge Recommendations: Acute rehab  Jason Stevenson is a great acute rehab candidate due to age, has high level functional mobility goals, good friend/family support, and highly motivated  Complete details of today's session: RN cleared session. NA available for transfer. Patient agreeable, received semi-reclined in bed, VSS on room air. Completed mobility as documented below. At  end of session, the patient was left seated in recliner at bedside, lower extremities elevated in recliner, with all needs in reach, with nurse call device in reach. His status was communicated to the RN, NA, Patient.    11/21/23 1429  Discipline Timestamp  Discipline Timestamp PT  Documentation Type     Documentation Type                                E,R, T   Treatment  Patient Subjective Information  Patient Subjective Information Patient agreeable to therapy  Precautions  Precautions Falls Risk;Isolation  Isolation Precautions Contact  Patient/Family Goals  Patient/Family Goals Return to previous lifestyle  Vital Signs  Heart Rate 96  Heart Rate Source Monitor  Pain Assessment  Pain Assessment %% 0-10  Pain Score %% Zero  Activity At Time Of Vitals Measurement  Activity Rest;Physical Exertion  Review of Systems Cardiovascular/Pulmonary Function  Any supplemental oxygen? No  Review of Systems - Cognition                         Overall Cognitive Status WNL  Arousal/Alertness Alert  Behavior Cooperative  Communication WNL  Mobility  Time spent sitting seated in recliner at end of session  Bed Mobility  Supine to Sit;Sit to Stand;Stand to Sit       Supine to Sit Assistance Stand by assist       Supine to Sit Details Requires extra time;Head of bed flat (RLE lifting LLE to advance to EOB)       Number of People Required 1       Sit to Stand Assistance Moderate assist (pt. performs 50-75%)       Sit to Stand Details Requires extra  time;Verbal cues       Number of People Required 2       Stand to Sit Assistance Minimal assist (pt. performs > 75%)       Stand to Sit Details Requires extra time;Verbal cues       Number of People Required 2       Transfers Stand Pivot transfer       Stand Pivot Assistance Moderate assist (pt. performs 50-75%)       Stand Pivot Transfer Details Requires extra time;Verbal cues;to;recliner (tactile cue to LLE to pivot)       Number of People  Required 2  Ambulation No  Adult PT Outcomes  Highest Level of Activity 6- Transferring bed to chair with standing  Time in Highest Level of Activity 1-5 min  Adverse Events 0- None  Inpatient AM-PAC Performed Basic Mobility Inpatient Short Form - 6 Clicks  AM-PAC 6 Clicks Basic Mobility Inpatient Short Form  Turning from your back to your side while in a flat bed without using bedrails? 3-A Little  Moving from lying on your back to sitting on the side of a flat bed without using bedrails? 3-A Little  Moving to and from a bed to a chair (including a wheelchair)? 2-A Lot  Standing up from a chair using using your arms (e.g,. wheelchair, or bedside chair)? 2-A Lot  To walk in hospital room? 1-Total  Climbing 3-5 steps with a railing? 1-Total  AM-PAC Basic Mobility Raw Score 12  AM-PAC Basic Mobility t-Scale Score 32.23  AM-PAC Basic Mobility G-Code Modifier CL  Patient Status At End of Session  Status Communicated to: RN;NA;Patient  Pt Left: seated in recliner at bedside;lower extremities elevated in recliner;with all needs in reach;with nurse call device in reach  Assessment  PT Enhancers Good family support/resources;Motivated;Previous level of function/active lifestyle;Age  PT Barriers Impaired cognition;Decreased activity tolerance/medically complex/comorbidities;Supplemental oxygen needs;Decreased level of alertness  Impairments/Functional Limitations    Decreased strength;Decreased endurance/activity tolerance;Decreased transfers;Decreased bed mobility;Gait/Ambulation limitations;Impaired cognition;Impaired functional mobility  Rehab Potential   Good  Patient safe for DC/PT perspective? Yes  Summary of Findings  Tolerated treatment well  Plan  Treatment/Interventions Therapeutic exercise;Patient and family education  PT Frequency 5x per week  Discharge Recommendation (DUH/DRH) Acute rehab  Acute Rehab Justifications Patient demonstrates need for acute, intensive combined  therapies, 3 hrs/day each day.;The patient has goals for high level balance and functional mobility;The patient has family support  DME Recommendations Per accepting facility  Plan(Progress Note) Continue current plan of care   Please see patient education record for PT teaching completed today.  MEREDITH HARDY, PT

## 2023-11-22 DIAGNOSIS — Z941 Heart transplant status: Secondary | ICD-10-CM | POA: Diagnosis not present

## 2023-11-22 DIAGNOSIS — I6930 Unspecified sequelae of cerebral infarction: Secondary | ICD-10-CM | POA: Diagnosis not present

## 2023-11-22 DIAGNOSIS — D849 Immunodeficiency, unspecified: Secondary | ICD-10-CM | POA: Diagnosis not present

## 2023-11-22 NOTE — Progress Notes (Signed)
 Cardiology Progress Note    11/22/2023 Hospital Day: 14  Jason Stevenson is a 38 y.o.male who was initially admitted to OSH with lethargy, worsening left sided weakness and c/f sepsis on 6/17. He was transferred to [facility] after arrested on floor (5 rounds CPR w/ ROSC) and ultimately arrested again on 6/18 where [facility] deployed. Decannulated from [facility] ECMO on 6/21; However, hypoxemic intra-op and VV ECMO deployed. Left chest tube placed for pleural effusion intra-op (6/21). Trach on 6/25 and VV ECMO decannulation. Right pigtail placed for pleural effusion paced on 7W (6/30). Decannulated 8/4. Working on Scientist, physiological.   Subjective:   No acute events overnight. No complaints today.  Objective:   Vital signs in last 24 hours: Temp:  [36.3 C (97.4 F)-37.1 C (98.8 F)] 36.4 C (97.5 F) Heart Rate:  [90-104] 100 Resp:  [16] 16 BP: (106-134)/(76-97) 128/96 SpO2: 99 % Last BM Date: 11/22/23  Weights: Last weight: 100.3 kg (221 lb 1.9 oz)    First weight: 89.2 kg (196 lb 11.2 oz) BMI: Body mass index is 30.96 kg/m.  24-Hour Intake/Output: I/O last 2 completed shifts: In: 890 [P.O.:890] Out: 700 [Urine:700]  Telemetry: SR/ST 80-100s  Physical Exam:  General: alert, cooperative, and in NAD Neuro: PERRL. Speech clear. Oriented x4. Left extremities flaccid.   Respiratory: regular rate, symmetric, unlabored, clear to auscultation bilaterally, and no accessory muscle use. Anterior auscultation. Trach site with dressing. Cardiac: tachycardic, regular rhythm, S1, S2 present, no murmur, no rub, JVP low neck at 60 degrees Abdomen: normal bowel sounds, soft, nondistended, and mildly distended Extremities: extremities warm and well perfused, no clubbing or cyanosis, distal pulses intact, and edema in L ankle trace Lines: PIV, PICC  Labs:  BMP: Recent Labs  Lab 11/22/23 0410  NA 135  K 4.5  CL 102  CO2 24  BUN 19  CREATININE 1.0  GLUCOSE 128  CALCIUM  9.2  MG 1.5*    CBC: Recent Labs  Lab 11/22/23 0410  WBC 4.3  HGB 9.8*  HCT 29.9*  PLT 310   INR: No results for input(s): INR in the last 168 hours.   FK: No results for input(s): FK506 in the last 168 hours. CYA: Recent Labs    11/21/23 0407  CYA 196   LDH: No results for input(s): LDH in the last 168 hours.        Scheduled Medications: .  acetaminophen , 975 mg, Oral, Q6H PRN .  atorvastatin , 10 mg, Oral, QHS .  budesonide , 1 mg, Nebulization, BID RT .  calcium  acetate(phosphat bind), 1,334 mg, Oral, BID CC .  carboxymethylcellulose, 2 drop, Both Eyes, TID PRN .  carvediloL , 12.5 mg, Oral, BID CC .  cycloSPORINE  modified, 125 mg, Oral, QHS **AND** cycloSPORINE  modified, 125 mg, Oral, Daily .  dextrose  50% in water, 12.5-25 g, Intravenous, As Directed .  diclofenac , 2 g, Topical, QID PRN .  ezetimibe , 10 mg, Oral, Daily .  glucagon, 1 mg, Subcutaneous, As Directed .  heparin  (porcine), 5,000 Units, Subcutaneous, Q8H SCH .  insulin  GLARGINE-yfgn, 26 Units, Subcutaneous, Daily 1800 .  insulin  LISPRO (AdmeLOG , HumaLOG ) injection, 6 Units, Subcutaneous, Daily with breakfast .  insulin  LISPRO (AdmeLOG , HumaLOG ) injection, 6 Units, Subcutaneous, Daily with dinner .  insulin  LISPRO (AdmeLOG , HumaLOG ) injection, 8 Units, Subcutaneous, Daily with lunch .  insulin  LISPRO (AdmeLOG , HumaLOG ) injection, 0-3 Units, Subcutaneous, TID CC .  ipratropium-albuteroL , 3 mL, Nebulization, BID PRN .  levETIRAcetam , 1,500 mg, Oral, Q12H SCH .  lidocaine , 2  patch, Transdermal, Q24H .  magnesium  oxide, 400 mg, Oral, BID .  melatonin, 3 mg, Oral, QHS .  OXcarbazepine , 300 mg, Oral, Daily **AND** OXcarbazepine , 600 mg, Oral, QHS .  pantoprazole , 40 mg, Oral, Daily .  polyethylene glycol, 17 g, Oral, Daily PRN .  predniSONE , 5 mg, Oral, Daily .  pregabalin , 75 mg, Oral, QHS .  sennosides-docusate, 2 tablet, Oral, BID PRN .  sulfamethoxazole -trimethoprim , 1 tablet, Oral, Daily .  traZODone , 50 mg,  Oral, QHS .  valGANciclovir , 900 mg, Oral, Daily with breakfast  Assessment/Plan:   Principal Problem:   Heart transplant, orthotopic, status (CMS/HHS-HCC) Active Problems:   Mixed dyslipidemia   Essential hypertension   Poorly controlled type 2 diabetes mellitus with circulatory disorder (CMS/HHS-HCC)   Seizure (CMS/HHS-HCC)   Immunosuppressed status (CMS/HHS-HCC)   Anemia of chronic disease   Right ventricular dysfunction   On enteral nutrition   CVA (cerebral vascular accident) (CMS/HHS-HCC)   Cardiac arrest (CMS/HHS-HCC)   Acute on chronic respiratory failure with hypoxia (CMS/HHS-HCC)   Pleural effusion on right   Cytomegalovirus (CMV) viremia (CMS/HHS-HCC)   Fever   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 Suspect acute allograft dysfunction on admission likely 2/2 hypoxia/acute illness. RHC 7/2 with elevated L sided pressures and CI 2.8 off support; EMB 0R, insufficient sample for AMR testing. Last TTE 7/29 with normal bi-V function. HLAs 7/18 without DSA's.  - CYA 8/9 in process (goal 150-200). Adjust cyclosporine  dose accordingly - Continue prednisone  - Plan to defer MMF for the time being given significant infectious complications this admission.  - Continue Bactrim  (end 11/26/2023) - Continue carvedilol  - Resumed atorvastatin  8/4 at reduced dose given new CYA. Continue zetia   - PRN diuretic, last dose IV Bumex 2mg  on 7/13   # Hypoxic arrest 6/18 s/p [facility] --> transitioned to VV ECMO given intra-op hypoxemia on 6/21, decannulated 6/25 # ESBL Klebsiella HCAP # Bilateral pleural effusions s/p L pigtail 6/21 and R pigtail 6/30  Etiology of arrest suspected to be a respiratory  etiology, possibly an unwitnessed seizure. Persistent fevers, tachycardia and worsening pulmonary opacities on CXR. BAL 7/3 grew ESBL Klebsiella. BCx negative. s/p 2 week course of meropenem (ended 7/20). Required bilateral pigtail drains for pleural effusions. Both removed d/c'd, last 8/3. Trach capped 7/30, decannulated 8/4.  - Continue IS and pulmonary toilet. On room air   # CMV viremia  CMV PCR 109k 6/20. 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 EEG negative this admission. - Continue Keppra  and Trileptal    # Anemia, multifactorial Required several transfusions (last 7/8).  - H/H stable - CBC every other day    # LADA # Neuropathy - Insulin  per endocrine - Continue Lyrica    # Depression - Holding Cymbalta  since admission for now    # Severe protein calorie malnutrition # Hyperphosphatemia Required enteral nutrition initially. Passed FEES 7/15. Stopped TF and removed DHT 7/30. - Nutrition  following,  - SLP following. Continue regular diet with thin liquids. Requires assistance and 100% supervision.  - Started Phoslo  7/28. Suspect after TF discontinuation that Phos will correct to WNL and will be able to discontinue Phoslo  with time.   Comorbid Conditions: Nutritional Disorders:    Hypoalbuminemia:    Hypoalbuminemia is associated with increased risk for patients.  We will attempt to treat the underlying condition(s) contributing to this low albumin state. Electrolyte Disorders:    Hyponatremia:   Will continue to monitor.     Hyperkalemia:   Will continue to monitor.     Hypocalcemia:   Will continue to monitor.     Hypomagnesemia:  Hypomagnesemia present with lowest magnesium  of 1.5.  Magnesium   supplement administered.  Will continue to monitor.   Hematologic Disorders:    Anemia:  Anemia present with lowest hemoglobin of 9.8.  Will continue to monitor.          Code Status: Full Code VTE Prophylaxis:  VTE Prophylaxis  + Anticoagulant & Anti-Embolism Compression Device Ordered  heparin  (porcine), 5,000 Units, Subcutaneous, Q8H SCH, 5,000 Units at 11/22/23 0932       Discharge Planning: Plan to discharge to acute rehab Monday   SANDRA SALIM ANTON, PA-C  ------------------------------------------------------------------------------- Attestation signed by Devore, Adam David, MD at 11/22/2023  6:30 PM Attestation Statement:   I personally saw Mr. Clemon and performed a substantive portion of the medical decision making, in conjunction with the Advanced Practice Provider for the condition/treatment of recent stroke and subsequent respiratory arrest. He has been off ECMO for 1.5 months and decannulated. He looks remarkably well today - planning for transfer to acute rehab on Monday.  ADAM ALM BILE, MD  -------------------------------------------------------------------------------

## 2023-11-23 NOTE — Progress Notes (Signed)
 CHAPLAIN SERVICES:  Chaplain initiated follow-up visit to provide emotional and spiritual care, as appropriate, while rounding on unit 7300 - Cardiology. Chaplain met with Jason Stevenson. Chaplain celebrated and gave thanks with him for potentially going home. Jason Stevenson focus is on making arrangements, as he plans to be home. Chaplain remains available as needed or paged.    11/23/23 1250  Visit Information  Visit Type Follow-up Care  Who was Visited? Patient  Chaplain Making Visit Unit;On-call;Senior Staff  Urgency of Visit Routine  Receptivity Needs met  Spiritual/Pastoral Assessment  Ministry Interventions Celebrated/offered thanksgiving with patient/family  OTHER  Visit Requested By Chaplain initiated  Time Log 0.50   Jason Stevenson, M.Div, MSW, [facility] Senior Development worker, international aid Pager:  [phone]

## 2023-11-23 NOTE — Care Plan (Signed)
 Problem: Patients that are at an increased risk for delirium. Goal: Mobility:  The patient will be mobile. Outcome: Progressing Goal: Orientation:  The patient will be oriented to person, time and place in the environment Outcome: Progressing Goal: Sleep, rest, and comfort: The patient will be comfortable and rested. Outcome: Progressing Goal: Hydration and nutrition: The patient will be hydrated and nourished. Outcome: Progressing Goal: Collaboration and communication: The patient's status will be effectively communicated to the rest of the care team. Outcome: Progressing   Problem: Skin Integrity Impairment for Adult populations Goal: Sensory Perception Description: All Adult patients at medium/high risk based on Braden risk assessment subscale will not develop a pressure injury. Implement prevention interventions based on risk assessment sub-scale scores: Outcome: Progressing Goal: Moisture Description: All Adult patients at medium/high risk based on Braden risk assessment subscale will not develop a pressure injury. Implement prevention interventions based on risk assessment sub-scale scores: Outcome: Progressing Goal: Activity Description: All Adult patients at medium/high risk based on Braden risk assessment subscale will not develop a pressure injury. Implement prevention interventions based on risk assessment sub-scale scores: Outcome: Progressing Goal: Mobility Description: All Adult patients at medium/high risk based on Braden risk assessment subscale will not develop a pressure injury. Implement prevention interventions based on risk assessment sub-scale scores: Outcome: Progressing Goal: Nutrition Description: All Adult patients at medium/high risk based on Braden risk assessment subscale will not develop a pressure injury. Implement prevention interventions based on risk assessment sub-scale scores: Outcome: Progressing Goal: Friction & Shear Description: All Adult  patients at medium/high risk based on Braden risk assessment subscale will not develop a pressure injury. Implement prevention interventions based on risk assessment sub-scale scores: Outcome: Progressing   Problem: Skin Integrity Impairment for Pediatrics populations (includes neonates) Goal: Mobility Description: All Pediatric patients at medium/high risk based on Braden QD risk assessment subscale will not develop a pressure injury. Implement prevention interventions based on risk assessment sub-scale scores: Outcome: Progressing Goal: Sensory Perception Description: All Pediatric patients at medium/high risk based on Braden QD risk assessment subscale will not develop a pressure injury. Implement prevention interventions based on risk assessment sub-scale scores: Outcome: Progressing Goal: Friction & Shear Description: All Pediatric patients at medium/high risk based on Braden QD risk assessment subscale will not develop a pressure injury. Implement prevention interventions based on risk assessment sub-scale scores: Outcome: Progressing Goal: Nutrition Description: All Pediatric patients at medium/high risk based on Braden QD risk assessment subscale will not develop a pressure injury. Implement prevention interventions based on risk assessment sub-scale scores: Outcome: Progressing Goal: Tissue Perfusion & Oxygenation Description: All Pediatric patients at medium/high risk based on Braden QD risk assessment subscale will not develop a pressure injury. Implement prevention interventions based on risk assessment sub-scale scores: Outcome: Progressing Goal: Medical Devices Description: All Pediatric patients at medium/high risk based on Braden QD risk assessment subscale will not develop a pressure injury. Implement prevention interventions based on risk assessment sub-scale scores: Outcome: Progressing Goal: Repositionability / Skin Protection Description: All Pediatric patients at  medium/high risk based on Braden QD risk assessment subscale will not develop a pressure injury. Implement prevention interventions based on risk assessment sub-scale scores: Outcome: Progressing   Problem: Active wounds Goal: Active wounds will progress to healing Description: Assess skin integrity with a head-to-toe skin assessment on admission to hospital/unit, Q shift, and/or when off the unit >4 hours. Outcome: Progressing   Problem: Skin integrity knowledge deficit Goal: Will be able to teach-back wound care and/or pressure injury prevention strategies Description:  Assess skin integrity with a head-to-toe skin assessment on admission to hospital/unit, Q shift, and/or when off the unit >4 hours. Outcome: Progressing   Problem: Hemodynamic Instability: Goal: Patient will maintain optimal hemodynamic status. Outcome: Progressing   Problem: Vascular Compromise: Goal: Patient will maintain adequate tissue perfusion distal to insertion site of device/procedure. Description: IABP, ECMO, sheaths, vascular access, arterial lines Outcome: Progressing Goal: Decrease patient's  risk for venous thrombosis. Outcome: Progressing   Problem: Cognitive: Goal: Patient will maintain baseline neurological and functional status. Outcome: Progressing   Problem: Respiratory: Goal: Patient will maintain adequate gas exchange. Outcome: Progressing   Problem: Immunosupression: Goal: Patient will not have occurrences of rejection or active infection. Outcome: Progressing Goal: Demonstration of healing of incision without infection will stabilize. Outcome: Progressing   Problem: Activity: Goal: Ability to meet self-care needs will improve. Outcome: Progressing Goal: Ability to tolerate increased activity will improve. Outcome: Progressing   Problem: Bowel/Gastric/Renal: Goal: Patient will maintain adequate renal function. Outcome: Progressing Goal: Establishment of normal bowel function will  improve. Outcome: Progressing   Problem: Nutritional: Goal: Ability to attain and maintain optimal nutritional status will be restored. Outcome: Progressing   Problem: Skin Integrity: Goal: Implement precautions to protect skin integrity. Outcome: Progressing Goal: Provide skin care. Outcome: Progressing   Problem: Sensory: Goal: Assess pain status. Outcome: Progressing Goal: Monitor location of pain. Outcome: Progressing Goal: Pain level will decrease. Outcome: Progressing Goal: Assess effects of pain control measures. Outcome: Progressing Goal: Instruct to immediately report any chest discomfort or pain. Outcome: Progressing Goal: Satisfaction with pain management regimen will stabilize. Outcome: Progressing   Problem: All patients at High risk for falls Goal: Patient will remain free of falls. Outcome: Progressing   Problem: Medications Goal: Patient will mobilize safely without falling Outcome: Progressing   Problem: Risk for Injury related to restraint use Goal: Remains free from restraints Outcome: Progressing Goal: Patient will remain free from injury while in restraints Outcome: Progressing   Problem: Patients that are at an increased risk for delirium. Goal: Mobility:  The patient will be mobile. Outcome: Progressing Goal: Orientation:  The patient will be oriented to person, time and place in the environment Outcome: Progressing Goal: Sleep, rest, and comfort: The patient will be comfortable and rested. Outcome: Progressing Goal: Hydration and nutrition: The patient will be hydrated and nourished. Outcome: Progressing Goal: Collaboration and communication: The patient's status will be effectively communicated to the rest of the care team. Outcome: Progressing

## 2023-11-23 NOTE — Progress Notes (Signed)
     Endocrinology Treatment Note      Recent Labs    11/21/23 0836 11/21/23 1255 11/21/23 1807 11/21/23 2100 11/22/23 0836 11/22/23 1253 11/22/23 1751 11/22/23 2026 11/23/23 0830 11/23/23 1259  POCGLU 134 173* 165* 194* 124 132 205* 157* 123 165*    Blood glucoses at goal <180    On Prednisone  5 mg daily No changes to diabetes regimen at this time. Continue current doses: Glargine 24 units at 1700 Lispro 6-8-6 units with meals   Endocrinology will continue to follow.   Jason FLYNN, NP

## 2023-11-23 NOTE — Progress Notes (Signed)
 Cardiology Progress Note    11/23/2023 Hospital Day: 7  Jason Stevenson is a 38 y.o.male who was initially admitted to OSH with lethargy, worsening left sided weakness and c/f sepsis on 6/17. He was transferred to [facility] after arrested on floor (5 rounds CPR w/ ROSC) and ultimately arrested again on 6/18 where [facility] ECMO deployed. Decannulated from [facility] ECMO on 6/21; However, hypoxemic intra-op and VV ECMO deployed. Left chest tube placed for pleural effusion intra-op (6/21). Trach on 6/25 and VV ECMO decannulation. Right pigtail placed for pleural effusion paced on 7W (6/30). Decannulated 8/4. Working on Scientist, physiological.   Subjective:   No acute events overnight.   Objective:   Vital signs in last 24 hours: Temp:  [36.2 C (97.2 F)-37 C (98.6 F)] 37 C (98.6 F) Heart Rate:  [92-99] 96 Resp:  [16] 16 BP: (118-136)/(87-104) 134/104 SpO2: 98 % Last BM Date: 11/21/23  Weights: Last weight: 96.5 kg (212 lb 11.9 oz)    First weight: 89.2 kg (196 lb 11.2 oz) BMI: Body mass index is 29.78 kg/m.  24-Hour Intake/Output: I/O last 2 completed shifts: In: 440 [P.O.:440] Out: 950 [Urine:950]  Telemetry: SR/ST 90-100s  Physical Exam:  General: alert, cooperative, and in NAD Neuro: PERRL. Speech clear. Oriented x4. Left extremities flaccid.   Respiratory: regular rate, symmetric, unlabored, clear to auscultation bilaterally, and no accessory muscle use. Anterior auscultation. Trach site with dressing. Cardiac: tachycardic, regular rhythm, S1, S2 present, no murmur, no rub, JVP low neck at 60 degrees Abdomen: normal bowel sounds, soft, nondistended, and mildly distended Extremities: extremities warm and well perfused, no clubbing or cyanosis, distal pulses intact, and edema in L ankle trace Lines: PIV, PICC  Labs:  BMP: Recent Labs  Lab 11/23/23 0432  NA 136  K 4.0  CL 104  CO2 24  BUN 17  CREATININE 1.0  GLUCOSE 109  CALCIUM  9.1  MG 1.5*   CBC: Recent Labs  Lab  11/22/23 0410  WBC 4.3  HGB 9.8*  HCT 29.9*  PLT 310   INR: No results for input(s): INR in the last 168 hours.   FK: No results for input(s): FK506 in the last 168 hours. CYA: Recent Labs    11/22/23 0410  CYA 216   LDH: No results for input(s): LDH in the last 168 hours.        Scheduled Medications: .  acetaminophen , 975 mg, Oral, Q6H PRN .  atorvastatin , 10 mg, Oral, QHS .  budesonide , 1 mg, Nebulization, BID RT .  calcium  acetate(phosphat bind), 1,334 mg, Oral, BID CC .  carboxymethylcellulose, 2 drop, Both Eyes, TID PRN .  carvediloL , 12.5 mg, Oral, BID CC .  cycloSPORINE  modified, 125 mg, Oral, QHS **AND** cycloSPORINE  modified, 125 mg, Oral, Daily .  dextrose  50% in water, 12.5-25 g, Intravenous, As Directed .  diclofenac , 2 g, Topical, QID PRN .  ezetimibe , 10 mg, Oral, Daily .  glucagon, 1 mg, Subcutaneous, As Directed .  heparin  (porcine), 5,000 Units, Subcutaneous, Q8H SCH .  insulin  GLARGINE-yfgn, 26 Units, Subcutaneous, Daily 1800 .  insulin  LISPRO (AdmeLOG , HumaLOG ) injection, 6 Units, Subcutaneous, Daily with breakfast .  insulin  LISPRO (AdmeLOG , HumaLOG ) injection, 6 Units, Subcutaneous, Daily with dinner .  insulin  LISPRO (AdmeLOG , HumaLOG ) injection, 8 Units, Subcutaneous, Daily with lunch .  insulin  LISPRO (AdmeLOG , HumaLOG ) injection, 0-3 Units, Subcutaneous, TID CC .  ipratropium-albuteroL , 3 mL, Nebulization, BID PRN .  levETIRAcetam , 1,500 mg, Oral, Q12H SCH .  lidocaine , 2 patch, Transdermal,  Q24H .  magnesium  oxide, 400 mg, Oral, BID .  melatonin, 3 mg, Oral, QHS .  OXcarbazepine , 300 mg, Oral, Daily **AND** OXcarbazepine , 600 mg, Oral, QHS .  pantoprazole , 40 mg, Oral, Daily .  polyethylene glycol, 17 g, Oral, Daily PRN .  predniSONE , 5 mg, Oral, Daily .  pregabalin , 75 mg, Oral, QHS .  sennosides-docusate, 2 tablet, Oral, BID PRN .  traZODone , 50 mg, Oral, QHS .  valGANciclovir , 900 mg, Oral, Daily with breakfast  Assessment/Plan:    Principal Problem:   Heart transplant, orthotopic, status (CMS/HHS-HCC) Active Problems:   Mixed dyslipidemia   Essential hypertension   Poorly controlled type 2 diabetes mellitus with circulatory disorder (CMS/HHS-HCC)   Seizure (CMS/HHS-HCC)   Immunosuppressed status (CMS/HHS-HCC)   Anemia of chronic disease   Right ventricular dysfunction   On enteral nutrition   CVA (cerebral vascular accident) (CMS/HHS-HCC)   Cardiac arrest (CMS/HHS-HCC)   Acute on chronic respiratory failure with hypoxia (CMS/HHS-HCC)   Pleural effusion on right   Cytomegalovirus (CMV) viremia (CMS/HHS-HCC)   Fever   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 Suspect acute allograft dysfunction on admission likely 2/2 hypoxia/acute illness. RHC 7/2 with elevated L sided pressures and CI 2.8 off support; EMB 0R, insufficient sample for AMR testing. Last TTE 7/29 with normal bi-V function. HLAs 7/18 without DSA's.  - CYA 216 8/9 (goal 150-200). No change to cyclosporine , next check 8/11 - Continue prednisone  - Plan to defer MMF for the time being given significant infectious complications this admission.  - Continue Bactrim  (end 11/26/2023) - Continue carvedilol  - Resumed atorvastatin  8/4 at reduced dose given new CYA. Continue zetia   - PRN diuretic, last dose IV Bumex 2mg  on 7/13   # Hypoxic arrest 6/18 s/p [facility] ECMO --> transitioned to VV ECMO given intra-op hypoxemia on 6/21, decannulated 6/25 # ESBL Klebsiella HCAP # Bilateral pleural effusions s/p L pigtail 6/21 and R pigtail 6/30  Etiology of arrest suspected to be a respiratory etiology, possibly an unwitnessed seizure. Persistent fevers, tachycardia and worsening  pulmonary opacities on CXR. BAL 7/3 grew ESBL Klebsiella. BCx negative. s/p 2 week course of meropenem (ended 7/20). Required bilateral pigtail drains for pleural effusions. Both removed d/c'd, last 8/3. Trach capped 7/30, decannulated 8/4.  - Continue IS and pulmonary toilet. On room air   # CMV viremia  CMV PCR 109k 6/20. 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 EEG negative this admission. - Continue Keppra  and Trileptal    # Anemia, multifactorial Required several transfusions (last 7/8).  - H/H stable - CBC every other day    # LADA # Neuropathy - Insulin  per endocrine - Continue Lyrica    # Depression - Holding Cymbalta  since admission for now    # Severe protein calorie malnutrition # Hyperphosphatemia Required enteral nutrition initially. Passed FEES 7/15. Stopped TF and removed DHT 7/30. - Nutrition following,  - SLP following. Continue regular  diet with thin liquids. Requires assistance and 100% supervision.  - Started Phoslo  7/28. Suspect after TF discontinuation that Phos will correct to WNL and will be able to discontinue Phoslo  with time.   Comorbid Conditions: Nutritional Disorders:    Hypoalbuminemia:  Hypoalbuminemia present with lowest albumin of 2.5.   Hypoalbuminemia is associated with increased risk for patients.  We will attempt to treat the underlying condition(s) contributing to this low albumin state. Electrolyte Disorders:    Hyponatremia:   Will continue to monitor.     Hyperkalemia:   Will continue to monitor.     Hypocalcemia:   Will continue to monitor.     Hypomagnesemia:  Hypomagnesemia present with lowest magnesium  of 1.5.  Magnesium  supplement administered.  Will continue  to monitor.   Hematologic Disorders:    Anemia:   Will continue to monitor.          Code Status: Full Code VTE Prophylaxis:  VTE Prophylaxis  + Anticoagulant & Anti-Embolism Compression Device Ordered  heparin  (porcine), 5,000 Units, Subcutaneous, Q8H SCH, 5,000 Units at 11/23/23 9141       Discharge Planning: Plan to discharge to acute rehab Monday   SANDRA SALIM ANTON, PA-C  ------------------------------------------------------------------------------- Attestation signed by Devore, Adam David, MD at 11/23/2023 10:06 PM Attestation Statement:   I personally saw Mr. Williamsen and performed a substantive portion of the medical decision making, in conjunction with the Advanced Practice Provider for the condition/treatment of recent stroke and subsequent respiratory arrest. He has been off ECMO for 1.5 months and decannulated. He looks remarkably well today - planning for transfer to acute rehab on Monday.  ADAM ALM BILE, MD  -------------------------------------------------------------------------------

## 2023-11-24 ENCOUNTER — Other Ambulatory Visit: Payer: Self-pay | Admitting: Physical Medicine and Rehabilitation

## 2023-11-24 ENCOUNTER — Encounter: Payer: Self-pay | Admitting: Physical Medicine and Rehabilitation

## 2023-11-24 ENCOUNTER — Encounter (HOSPITAL_COMMUNITY): Payer: Self-pay | Admitting: Physical Medicine & Rehabilitation

## 2023-11-24 ENCOUNTER — Other Ambulatory Visit: Payer: Self-pay

## 2023-11-24 ENCOUNTER — Inpatient Hospital Stay (HOSPITAL_COMMUNITY)

## 2023-11-24 ENCOUNTER — Inpatient Hospital Stay (HOSPITAL_COMMUNITY)
Admission: AD | Admit: 2023-11-24 | Discharge: 2023-12-16 | DRG: 945 | Disposition: A | Discharge status: Home Health | Source: Other Acute Inpatient Hospital | Attending: Physical Medicine & Rehabilitation | Admitting: Physical Medicine & Rehabilitation

## 2023-11-24 DIAGNOSIS — I69154 Hemiplegia and hemiparesis following nontraumatic intracerebral hemorrhage affecting left non-dominant side: Secondary | ICD-10-CM

## 2023-11-24 DIAGNOSIS — Z8679 Personal history of other diseases of the circulatory system: Secondary | ICD-10-CM | POA: Diagnosis not present

## 2023-11-24 DIAGNOSIS — Z79899 Other long term (current) drug therapy: Secondary | ICD-10-CM | POA: Diagnosis not present

## 2023-11-24 DIAGNOSIS — E1142 Type 2 diabetes mellitus with diabetic polyneuropathy: Secondary | ICD-10-CM | POA: Diagnosis present

## 2023-11-24 DIAGNOSIS — I69119 Unspecified symptoms and signs involving cognitive functions following nontraumatic intracerebral hemorrhage: Secondary | ICD-10-CM | POA: Diagnosis not present

## 2023-11-24 DIAGNOSIS — Z941 Heart transplant status: Secondary | ICD-10-CM

## 2023-11-24 DIAGNOSIS — E1165 Type 2 diabetes mellitus with hyperglycemia: Secondary | ICD-10-CM | POA: Diagnosis not present

## 2023-11-24 DIAGNOSIS — G629 Polyneuropathy, unspecified: Secondary | ICD-10-CM | POA: Diagnosis present

## 2023-11-24 DIAGNOSIS — R7989 Other specified abnormal findings of blood chemistry: Secondary | ICD-10-CM

## 2023-11-24 DIAGNOSIS — I6930 Unspecified sequelae of cerebral infarction: Secondary | ICD-10-CM

## 2023-11-24 DIAGNOSIS — Z7982 Long term (current) use of aspirin: Secondary | ICD-10-CM

## 2023-11-24 DIAGNOSIS — I517 Cardiomegaly: Secondary | ICD-10-CM | POA: Diagnosis not present

## 2023-11-24 DIAGNOSIS — E139 Other specified diabetes mellitus without complications: Secondary | ICD-10-CM | POA: Diagnosis not present

## 2023-11-24 DIAGNOSIS — E1169 Type 2 diabetes mellitus with other specified complication: Secondary | ICD-10-CM | POA: Diagnosis not present

## 2023-11-24 DIAGNOSIS — Z930 Tracheostomy status: Secondary | ICD-10-CM | POA: Diagnosis not present

## 2023-11-24 DIAGNOSIS — R790 Abnormal level of blood mineral: Secondary | ICD-10-CM

## 2023-11-24 DIAGNOSIS — R200 Anesthesia of skin: Secondary | ICD-10-CM | POA: Diagnosis not present

## 2023-11-24 DIAGNOSIS — I5020 Unspecified systolic (congestive) heart failure: Secondary | ICD-10-CM

## 2023-11-24 DIAGNOSIS — Z7952 Long term (current) use of systemic steroids: Secondary | ICD-10-CM

## 2023-11-24 DIAGNOSIS — D638 Anemia in other chronic diseases classified elsewhere: Secondary | ICD-10-CM | POA: Diagnosis present

## 2023-11-24 DIAGNOSIS — Z794 Long term (current) use of insulin: Secondary | ICD-10-CM | POA: Diagnosis not present

## 2023-11-24 DIAGNOSIS — R569 Unspecified convulsions: Secondary | ICD-10-CM | POA: Diagnosis not present

## 2023-11-24 DIAGNOSIS — E785 Hyperlipidemia, unspecified: Secondary | ICD-10-CM | POA: Diagnosis not present

## 2023-11-24 DIAGNOSIS — I469 Cardiac arrest, cause unspecified: Secondary | ICD-10-CM

## 2023-11-24 DIAGNOSIS — N179 Acute kidney failure, unspecified: Secondary | ICD-10-CM | POA: Diagnosis not present

## 2023-11-24 DIAGNOSIS — R29818 Other symptoms and signs involving the nervous system: Secondary | ICD-10-CM

## 2023-11-24 DIAGNOSIS — E1140 Type 2 diabetes mellitus with diabetic neuropathy, unspecified: Secondary | ICD-10-CM | POA: Diagnosis not present

## 2023-11-24 DIAGNOSIS — R5381 Other malaise: Secondary | ICD-10-CM | POA: Diagnosis present

## 2023-11-24 DIAGNOSIS — Z7960 Long term (current) use of unspecified immunomodulators and immunosuppressants: Secondary | ICD-10-CM

## 2023-11-24 DIAGNOSIS — B259 Cytomegaloviral disease, unspecified: Secondary | ICD-10-CM | POA: Diagnosis present

## 2023-11-24 DIAGNOSIS — Z560 Unemployment, unspecified: Secondary | ICD-10-CM | POA: Diagnosis not present

## 2023-11-24 DIAGNOSIS — Z823 Family history of stroke: Secondary | ICD-10-CM | POA: Diagnosis not present

## 2023-11-24 DIAGNOSIS — D849 Immunodeficiency, unspecified: Secondary | ICD-10-CM | POA: Diagnosis not present

## 2023-11-24 DIAGNOSIS — I159 Secondary hypertension, unspecified: Secondary | ICD-10-CM | POA: Diagnosis not present

## 2023-11-24 DIAGNOSIS — Z8249 Family history of ischemic heart disease and other diseases of the circulatory system: Secondary | ICD-10-CM

## 2023-11-24 DIAGNOSIS — Z833 Family history of diabetes mellitus: Secondary | ICD-10-CM

## 2023-11-24 DIAGNOSIS — E11649 Type 2 diabetes mellitus with hypoglycemia without coma: Secondary | ICD-10-CM | POA: Diagnosis not present

## 2023-11-24 DIAGNOSIS — T8623 Heart transplant infection: Secondary | ICD-10-CM | POA: Diagnosis not present

## 2023-11-24 DIAGNOSIS — I10 Essential (primary) hypertension: Secondary | ICD-10-CM | POA: Diagnosis not present

## 2023-11-24 DIAGNOSIS — E119 Type 2 diabetes mellitus without complications: Secondary | ICD-10-CM | POA: Diagnosis not present

## 2023-11-24 HISTORY — DX: Polyneuropathy, unspecified: G62.9

## 2023-11-24 HISTORY — DX: Unspecified convulsions: R56.9

## 2023-11-24 HISTORY — DX: Type 2 diabetes mellitus without complications: E11.9

## 2023-11-24 HISTORY — DX: Cerebral infarction, unspecified: I63.9

## 2023-11-24 HISTORY — DX: Heart transplant status: Z94.1

## 2023-11-24 LAB — GLUCOSE, CAPILLARY
Glucose-Capillary: 126 mg/dL — ABNORMAL HIGH (ref 70–99)
Glucose-Capillary: 199 mg/dL — ABNORMAL HIGH (ref 70–99)

## 2023-11-24 MED ORDER — INSULIN ASPART 100 UNIT/ML IJ SOLN
0.0000 [IU] | Freq: Every day | INTRAMUSCULAR | Status: DC
  Administered 2023-11-28 – 2023-12-03 (×4): 2 [IU] via SUBCUTANEOUS
  Administered 2023-12-10: 3 [IU] via SUBCUTANEOUS

## 2023-11-24 MED ORDER — CARVEDILOL 12.5 MG PO TABS
12.5000 mg | ORAL_TABLET | Freq: Two times a day (BID) | ORAL | Status: DC
  Administered 2023-11-24 – 2023-11-30 (×17): 12.5 mg via ORAL
  Filled 2023-11-24 (×12): qty 1

## 2023-11-24 MED ORDER — PREGABALIN 75 MG PO CAPS
75.0000 mg | ORAL_CAPSULE | Freq: Every day | ORAL | Status: DC
  Administered 2023-11-24 – 2023-12-15 (×25): 75 mg via ORAL
  Filled 2023-11-24 (×22): qty 1

## 2023-11-24 MED ORDER — TRAZODONE HCL 50 MG PO TABS: 25.0000 mg | ORAL_TABLET | Freq: Every evening | ORAL | Status: DC | PRN

## 2023-11-24 MED ORDER — ACETAMINOPHEN 325 MG PO TABS
325.0000 mg | ORAL_TABLET | ORAL | Status: DC | PRN
  Administered 2023-11-30 – 2023-12-03 (×4): 650 mg via ORAL
  Filled 2023-11-24 (×4): qty 2

## 2023-11-24 MED ORDER — IPRATROPIUM-ALBUTEROL 0.5-2.5 (3) MG/3ML IN SOLN: 3.0000 mL | RESPIRATORY_TRACT | Status: DC | PRN

## 2023-11-24 MED ORDER — SULFAMETHOXAZOLE-TRIMETHOPRIM 400-80 MG PO TABS
1.0000 | ORAL_TABLET | Freq: Every day | ORAL | Status: AC
  Administered 2023-11-25 – 2023-11-26 (×4): 1 via ORAL
  Filled 2023-11-24 (×2): qty 1

## 2023-11-24 MED ORDER — CALCIUM ACETATE (PHOS BINDER) 667 MG PO CAPS
1334.0000 mg | ORAL_CAPSULE | Freq: Three times a day (TID) | ORAL | Status: DC
  Administered 2023-11-24 – 2023-12-16 (×71): 1334 mg via ORAL
  Filled 2023-11-24 (×65): qty 2

## 2023-11-24 MED ORDER — ATORVASTATIN CALCIUM 10 MG PO TABS
10.0000 mg | ORAL_TABLET | Freq: Every day | ORAL | Status: DC
  Administered 2023-11-24 – 2023-12-15 (×25): 10 mg via ORAL
  Filled 2023-11-24 (×22): qty 1

## 2023-11-24 MED ORDER — PREDNISONE 5 MG PO TABS
5.0000 mg | ORAL_TABLET | Freq: Every day | ORAL | Status: DC
  Administered 2023-11-25 – 2023-12-16 (×24): 5 mg via ORAL
  Filled 2023-11-24 (×21): qty 1

## 2023-11-24 MED ORDER — PROCHLORPERAZINE MALEATE 5 MG PO TABS: 5.0000 mg | ORAL_TABLET | Freq: Four times a day (QID) | ORAL | Status: DC | PRN

## 2023-11-24 MED ORDER — PROCHLORPERAZINE EDISYLATE 10 MG/2ML IJ SOLN: 5.0000 mg | Freq: Four times a day (QID) | INTRAMUSCULAR | Status: DC | PRN

## 2023-11-24 MED ORDER — MELATONIN 3 MG PO TABS
3.0000 mg | ORAL_TABLET | Freq: Every day | ORAL | Status: DC
  Administered 2023-11-24 – 2023-12-15 (×25): 3 mg via ORAL
  Filled 2023-11-24 (×22): qty 1

## 2023-11-24 MED ORDER — PROCHLORPERAZINE 25 MG RE SUPP: 12.5000 mg | Freq: Four times a day (QID) | RECTAL | Status: DC | PRN

## 2023-11-24 MED ORDER — DICLOFENAC SODIUM 1 % EX GEL
2.0000 g | Freq: Four times a day (QID) | CUTANEOUS | Status: DC | PRN
  Administered 2023-12-03: 2 g via TOPICAL
  Filled 2023-11-24 (×2): qty 100

## 2023-11-24 MED ORDER — GUAIFENESIN-DM 100-10 MG/5ML PO SYRP: 5.0000 mL | ORAL_SOLUTION | Freq: Four times a day (QID) | ORAL | Status: DC | PRN

## 2023-11-24 MED ORDER — OXCARBAZEPINE 300 MG PO TABS
300.0000 mg | ORAL_TABLET | Freq: Every day | ORAL | Status: DC
  Administered 2023-11-25 – 2023-12-16 (×24): 300 mg via ORAL
  Filled 2023-11-24 (×22): qty 1

## 2023-11-24 MED ORDER — INSULIN GLARGINE-YFGN 100 UNIT/ML ~~LOC~~ SOLN
26.0000 [IU] | Freq: Every day | SUBCUTANEOUS | Status: DC
  Administered 2023-11-24 – 2023-11-27 (×7): 26 [IU] via SUBCUTANEOUS
  Filled 2023-11-24 (×5): qty 0.26

## 2023-11-24 MED ORDER — TRAZODONE HCL 50 MG PO TABS
50.0000 mg | ORAL_TABLET | Freq: Every day | ORAL | Status: DC
  Administered 2023-11-24 – 2023-12-15 (×25): 50 mg via ORAL
  Filled 2023-11-24 (×22): qty 1

## 2023-11-24 MED ORDER — EZETIMIBE 10 MG PO TABS
10.0000 mg | ORAL_TABLET | Freq: Every day | ORAL | Status: DC
  Administered 2023-11-25 – 2023-12-16 (×24): 10 mg via ORAL
  Filled 2023-11-24 (×22): qty 1

## 2023-11-24 MED ORDER — INSULIN ASPART 100 UNIT/ML IJ SOLN
5.0000 [IU] | Freq: Three times a day (TID) | INTRAMUSCULAR | Status: DC
  Administered 2023-11-24 – 2023-12-02 (×28): 5 [IU] via SUBCUTANEOUS

## 2023-11-24 MED ORDER — VALGANCICLOVIR HCL 450 MG PO TABS
900.0000 mg | ORAL_TABLET | Freq: Every day | ORAL | Status: DC
  Administered 2023-11-25 – 2023-12-16 (×24): 900 mg via ORAL
  Filled 2023-11-24 (×22): qty 2

## 2023-11-24 MED ORDER — SENNOSIDES-DOCUSATE SODIUM 8.6-50 MG PO TABS: 2.0000 | ORAL_TABLET | Freq: Every evening | ORAL | Status: DC | PRN

## 2023-11-24 MED ORDER — ENSURE MAX PROTEIN PO LIQD
11.0000 [oz_av] | Freq: Two times a day (BID) | ORAL | Status: DC
  Administered 2023-11-24 – 2023-12-16 (×33): 11 [oz_av] via ORAL

## 2023-11-24 MED ORDER — PANTOPRAZOLE SODIUM 40 MG PO TBEC
40.0000 mg | DELAYED_RELEASE_TABLET | Freq: Every day | ORAL | Status: DC
  Administered 2023-11-25 – 2023-12-16 (×24): 40 mg via ORAL
  Filled 2023-11-24 (×22): qty 1

## 2023-11-24 MED ORDER — HEPARIN SODIUM (PORCINE) 5000 UNIT/ML IJ SOLN
5000.0000 [IU] | Freq: Three times a day (TID) | INTRAMUSCULAR | Status: DC
  Administered 2023-11-24 – 2023-12-16 (×70): 5000 [IU] via SUBCUTANEOUS
  Filled 2023-11-24 (×63): qty 1

## 2023-11-24 MED ORDER — BISACODYL 10 MG RE SUPP: 10.0000 mg | Freq: Every day | RECTAL | Status: DC | PRN

## 2023-11-24 MED ORDER — ALUM & MAG HYDROXIDE-SIMETH 200-200-20 MG/5ML PO SUSP
30.0000 mL | ORAL | Status: DC | PRN
  Administered 2023-12-03: 30 mL via ORAL
  Filled 2023-11-24: qty 30

## 2023-11-24 MED ORDER — INSULIN ASPART 100 UNIT/ML IJ SOLN
0.0000 [IU] | Freq: Three times a day (TID) | INTRAMUSCULAR | Status: DC
  Administered 2023-11-24 (×2): 1 [IU] via SUBCUTANEOUS
  Administered 2023-11-25 – 2023-11-26 (×6): 2 [IU] via SUBCUTANEOUS
  Administered 2023-11-26: 1 [IU] via SUBCUTANEOUS
  Administered 2023-11-26: 2 [IU] via SUBCUTANEOUS
  Administered 2023-11-26: 1 [IU] via SUBCUTANEOUS
  Administered 2023-11-26: 2 [IU] via SUBCUTANEOUS
  Administered 2023-11-27 (×2): 3 [IU] via SUBCUTANEOUS
  Administered 2023-11-27: 2 [IU] via SUBCUTANEOUS
  Administered 2023-11-28: 3 [IU] via SUBCUTANEOUS
  Administered 2023-11-28: 1 [IU] via SUBCUTANEOUS
  Administered 2023-11-28 – 2023-11-30 (×5): 2 [IU] via SUBCUTANEOUS
  Administered 2023-11-30: 1 [IU] via SUBCUTANEOUS
  Administered 2023-11-30: 2 [IU] via SUBCUTANEOUS
  Administered 2023-12-01 (×2): 1 [IU] via SUBCUTANEOUS
  Administered 2023-12-02 – 2023-12-03 (×3): 2 [IU] via SUBCUTANEOUS
  Administered 2023-12-03 – 2023-12-04 (×3): 1 [IU] via SUBCUTANEOUS
  Administered 2023-12-04: 2 [IU] via SUBCUTANEOUS
  Administered 2023-12-05: 1 [IU] via SUBCUTANEOUS
  Administered 2023-12-05 – 2023-12-06 (×2): 2 [IU] via SUBCUTANEOUS
  Administered 2023-12-06: 7 [IU] via SUBCUTANEOUS
  Administered 2023-12-07: 2 [IU] via SUBCUTANEOUS
  Administered 2023-12-07: 3 [IU] via SUBCUTANEOUS
  Administered 2023-12-08: 2 [IU] via SUBCUTANEOUS
  Administered 2023-12-08 – 2023-12-09 (×2): 3 [IU] via SUBCUTANEOUS
  Administered 2023-12-09: 1 [IU] via SUBCUTANEOUS
  Administered 2023-12-09 – 2023-12-10 (×2): 2 [IU] via SUBCUTANEOUS
  Administered 2023-12-10: 3 [IU] via SUBCUTANEOUS

## 2023-11-24 MED ORDER — LEVETIRACETAM 750 MG PO TABS
1500.0000 mg | ORAL_TABLET | Freq: Two times a day (BID) | ORAL | Status: DC
  Administered 2023-11-24 – 2023-12-16 (×49): 1500 mg via ORAL
  Filled 2023-11-24 (×44): qty 2

## 2023-11-24 MED ORDER — LIDOCAINE 5 % EX PTCH
2.0000 | MEDICATED_PATCH | CUTANEOUS | Status: DC
  Administered 2023-11-25 – 2023-12-16 (×24): 2 via TRANSDERMAL
  Filled 2023-11-24 (×22): qty 2

## 2023-11-24 MED ORDER — FLEET ENEMA RE ENEM: 1.0000 | ENEMA | Freq: Once | RECTAL | Status: DC | PRN

## 2023-11-24 MED ORDER — DIPHENHYDRAMINE HCL 25 MG PO CAPS: 25.0000 mg | ORAL_CAPSULE | Freq: Four times a day (QID) | ORAL | Status: DC | PRN

## 2023-11-24 MED ORDER — CYCLOSPORINE MODIFIED (GENGRAF) 25 MG PO CAPS
125.0000 mg | ORAL_CAPSULE | Freq: Two times a day (BID) | ORAL | Status: DC
  Administered 2023-11-24 – 2023-11-28 (×14): 125 mg via ORAL
  Filled 2023-11-24 (×10): qty 5

## 2023-11-24 MED ORDER — BUDESONIDE 0.5 MG/2ML IN SUSP
0.2500 mg | Freq: Two times a day (BID) | RESPIRATORY_TRACT | Status: DC
  Administered 2023-11-24 – 2023-12-16 (×41): 0.25 mg via RESPIRATORY_TRACT
  Filled 2023-11-24 (×45): qty 2

## 2023-11-24 MED ORDER — OXCARBAZEPINE 300 MG PO TABS
600.0000 mg | ORAL_TABLET | Freq: Every day | ORAL | Status: DC
  Administered 2023-11-24 – 2023-12-15 (×25): 600 mg via ORAL
  Filled 2023-11-24 (×23): qty 2

## 2023-11-24 MED ORDER — BENEPROTEIN PO POWD
1.0000 | Freq: Three times a day (TID) | ORAL | Status: DC
  Administered 2023-11-24 – 2023-12-16 (×37): 6 g via ORAL
  Filled 2023-11-24: qty 227

## 2023-11-24 NOTE — Plan of Care (Signed)
  Problem: Consults Goal: RH GENERAL PATIENT EDUCATION Description: See Patient Education module for education specifics. Outcome: Progressing   Problem: RH BOWEL ELIMINATION Goal: RH STG MANAGE BOWEL WITH ASSISTANCE Description: STG Manage Bowel with mod I Assistance. Outcome: Progressing Goal: RH STG MANAGE BOWEL W/MEDICATION W/ASSISTANCE Description: STG Manage Bowel with Medication with mod I Assistance. Outcome: Progressing   Problem: RH BLADDER ELIMINATION Goal: RH STG MANAGE BLADDER WITH ASSISTANCE Description: STG Manage Bladder With mod I Assistance Outcome: Progressing   Problem: RH SKIN INTEGRITY Goal: RH STG SKIN FREE OF INFECTION/BREAKDOWN Description: Manage w min assist Outcome: Progressing   Problem: RH SAFETY Goal: RH STG ADHERE TO SAFETY PRECAUTIONS W/ASSISTANCE/DEVICE Description: STG Adhere to Safety Precautions With cues Assistance/Device. Outcome: Progressing   Problem: RH PAIN MANAGEMENT Goal: RH STG PAIN MANAGED AT OR BELOW PT'S PAIN GOAL Description: Pain < 4 with prns Outcome: Progressing   Problem: RH KNOWLEDGE DEFICIT GENERAL Goal: RH STG INCREASE KNOWLEDGE OF SELF CARE AFTER HOSPITALIZATION Description: Patient and wife will be able to manage care at discharge using educational resources independently Outcome: Progressing   Problem: Education: Goal: Ability to describe self-care measures that may prevent or decrease complications (Diabetes Survival Skills Education) will improve Outcome: Progressing Goal: Individualized Educational Video(s) Outcome: Progressing   Problem: Coping: Goal: Ability to adjust to condition or change in health will improve Outcome: Progressing   Problem: Fluid Volume: Goal: Ability to maintain a balanced intake and output will improve Outcome: Progressing   Problem: Health Behavior/Discharge Planning: Goal: Ability to identify and utilize available resources and services will improve Outcome:  Progressing Goal: Ability to manage health-related needs will improve Outcome: Progressing   Problem: Metabolic: Goal: Ability to maintain appropriate glucose levels will improve Outcome: Progressing   Problem: Nutritional: Goal: Maintenance of adequate nutrition will improve Outcome: Progressing Goal: Progress toward achieving an optimal weight will improve Outcome: Progressing   Problem: Skin Integrity: Goal: Risk for impaired skin integrity will decrease Outcome: Progressing   Problem: Tissue Perfusion: Goal: Adequacy of tissue perfusion will improve Outcome: Progressing

## 2023-11-24 NOTE — Consult Note (Signed)
 Advanced Heart Failure Team Consult Note  Primary Physician: Vicci Barnie NOVAK, MD Cardiologist:  Maude Emmer, MD HF Cardiologist: Dr. Rolan Reason for Consultation: S/P OHT HPI:    Jason Stevenson is seen today for evaluation of S/P OHT at the request of Dr. Carilyn.   38 y.o. 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  Admitted in March 2025 with N/V. At that time, Echo with normal EF. CMV, PCR, RVP, and all cultures negative. EMBx without evidence of rejection.  Again admitted in April for acute CVA with hemorrhagic conversion. EEG with multiple seizures. MRI brain with subacute hemorrhage in R frontal and temporal lobes with 3mm midline shift. Course complicated by PNA. Briefly stated on ganciclovir, however CMV negative so stopped. All cultures negative. Fall in May with repeat head CT showing continued evolution of hemorrhage with persistent mass effect.   Most recently presented to [facility] 09/30/23 with weakness and was transferred to [facility]. On 10/01/23, he PEA arrested (presumed respiratory etiology) s/p VA ECMO and later transitioned to VV ECMO on 6/21. He underwent tracheostomy and VV decannulation on 6/25 s/p tracheostomy which was de-cannulated on 8/4. CMP PVR positive, induced with ganciclovir, no on valganciclovir .   Today, patient transferred back to [facility] for inpatient rehab. Reports that he is feeling well. No complaints. Appears euvolemic. Labs from today reviewed.   Home Medications Prior to Admission medications   Medication Sig Start Date End Date Taking? Authorizing Provider  ACCU-CHEK GUIDE test strip USE TO CHECK BLOOD SUGAR THREE TIMES DAILY. 07/03/22   Vicci Barnie NOVAK, MD  acetaminophen  (TYLENOL ) 325 MG tablet Take 1-2 tablets (325-650 mg total) by mouth every 4 (four) hours as needed for mild  pain. 08/28/21   Lesia Ozell Barter, PA-C  amLODipine (NORVASC) 5 MG tablet Take 5 mg by mouth daily.    [provider]  aspirin  EC 81 MG tablet Take 81 mg by mouth daily. Swallow whole.    [provider]  Blood Glucose Monitoring Suppl (ACCU-CHEK GUIDE ME) w/Device KIT USE TO CHECK BLOOD SUGAR THREE TIMES DAILY. 07/04/22   Vicci Barnie NOVAK, MD  Blood Pressure Monitor DEVI Use as directed to check home blood pressure 2-3 times a week 10/12/18   Vicci Barnie NOVAK, MD  Calcium  Carbonate-Vitamin D (CALTRATE 600+D PO) Take 600 mg by mouth in the morning and at bedtime.    [provider]  Continuous Glucose Sensor (FREESTYLE LIBRE 2 SENSOR) MISC UAD 04/16/23   Vicci Barnie NOVAK, MD  DULoxetine  (CYMBALTA ) 60 MG capsule Take 60 mg by mouth daily. 02/25/23   [provider]  EASY COMFORT PEN NEEDLES 32G X 4 MM MISC USE TO INJECT INSULIN  FOUR TIMES A DAY 09/22/23   Vicci Barnie NOVAK, MD  ezetimibe  (ZETIA ) 10 MG tablet Take 10 mg by mouth daily.    [provider]  furosemide  (LASIX ) 20 MG tablet TAKE 1 TABLET (20 MG TOTAL) BY MOUTH 3 (THREE) TIMES A WEEK. MONDAYS WEDNESDAYS AND FRIDAY Patient taking differently: Take 20 mg by mouth daily as needed for edema. 11/26/22   [place]ford, Harlene HERO, FNP  GLUCAGON NA Place 3 sprays into the nose once as needed.    [provider]  insulin  glargine (LANTUS ) 100 UNIT/ML injection Inject 28 Units into the skin daily.    [provider]  insulin  lispro (HUMALOG   KWIKPEN) 100 UNIT/ML KwikPen Inject 14-16 Units into the skin. 14 am 16 noon and pm    [provider]  Insulin  Syringe-Needle U-100 (RELION INSULIN  SYRINGE) 31G X 15/64 0.3 ML MISC Use to inject insulin  daily. 07/13/18   Vicci Barnie NOVAK, MD  levETIRAcetam  (KEPPRA ) 750 MG tablet TAKE 2 TABLETS BY MOUTH 2 (TWO) TIMES DAILY (2AM+ 2EVENING) 11/28/22   Penumalli, Eduard SAUNDERS, MD  magnesium  oxide (MAG-OX) 400 (240 Mg) MG tablet Take 1 tablet by  mouth 2 (two) times daily. Patient not taking: Reported on 06/29/2023 05/29/23   [provider]  mycophenolate (CELLCEPT ) 250 MG capsule Take 750 mg by mouth every 12 (twelve) hours.    [provider]  Oxcarbazepine  (TRILEPTAL ) 300 MG tablet TAKE 2 TABLETS (600 MG TOTAL) BY MOUTH 2 (TWO) TIMES DAILY (AM+EVENING) 11/28/22   Penumalli, Eduard SAUNDERS, MD  pantoprazole  (PROTONIX ) 40 MG tablet Take 40 mg by mouth daily.    [provider]  predniSONE  (DELTASONE ) 5 MG tablet Take 5 mg by mouth daily with breakfast. Take 1 tablets once a day    [provider]  pregabalin  (LYRICA ) 150 MG capsule Take 150 mg by mouth at bedtime. 06/04/23   [provider]  propranolol (INDERAL) 10 MG tablet Take 10 mg by mouth 2 (two) times daily.    [provider]  rosuvastatin  (CRESTOR ) 10 MG tablet Take 20 mg by mouth daily.    [provider]  senna-docusate (SENOKOT-S) 8.6-50 MG tablet Take 1-2 tablets by mouth 2 (two) times daily as needed for mild constipation. 12/03/22   [provider]  sulfamethoxazole -trimethoprim  (BACTRIM ) 400-80 MG tablet Take 1 tablet by mouth daily.    [provider]  tacrolimus  (PROGRAF ) 1 MG capsule Take 5-6 mg by mouth See admin instructions. Take 6 capsules in the morning and 5 capsules every evening    [provider]  valGANciclovir  (VALCYTE ) 450 MG tablet Take by mouth daily. Take 2 tablets by mouth every evening for 180 days Patient not taking: Reported on 06/29/2023    [provider]    Past Medical History: Past Medical History:  Diagnosis Date   Chronic systolic CHF (congestive heart failure) (HCC) 06/18/2015   Heart transplant recipient [facility])    Hemorrhagic stroke (HCC)    Hyperlipidemia    Hypertension    Immune deficiency disorder (HCC)    Neuropathy    Nonischemic cardiomyopathy (HCC) Noted as early as 2007   Per chart review (cards consult note 2011), EF of 40% in 2007, down  to 20-25% in 2011   Seizures (HCC)    Stroke (cerebrum) (HCC)    Type 2 diabetes mellitus (HCC)     Past Surgical History: Past Surgical History:  Procedure Laterality Date   ICD IMPLANT N/A 08/27/2021   Procedure: ICD IMPLANT;  Surgeon: Cindie Ole DASEN, MD;  Location: [facility] INVASIVE CV LAB;  Service: Cardiovascular;  Laterality: N/A;   None     RIGHT HEART CATH  06/21/2021   [facility]   RIGHT HEART CATH N/A 05/17/2022   Procedure: RIGHT HEART CATH;  Surgeon: Rolan Ezra RAMAN, MD;  Location: [facility] INVASIVE CV LAB;  Service: Cardiovascular;  Laterality: N/A;   RIGHT/LEFT HEART CATH AND CORONARY ANGIOGRAPHY N/A 11/02/2020   Procedure: RIGHT/LEFT HEART CATH AND CORONARY ANGIOGRAPHY;  Surgeon: Rolan Ezra RAMAN, MD;  Location: [facility] INVASIVE CV LAB;  Service: Cardiovascular;  Laterality: N/A;    Family History: Family History  Problem Relation Age of Onset  Stroke Mother    Diabetes Mother    Hypertension Mother    Stroke Maternal Aunt    Heart attack Neg Hx     Social History: Social History   Socioeconomic History   Marital status: Married    Spouse name: [name]   Number of children: 0   Years of education: 13   Highest education level: Some college, no degree  Occupational History   Occupation: unemployed  Tobacco Use   Smoking status: Never   Smokeless tobacco: Never  Vaping Use   Vaping status: Not on file  Substance and Sexual Activity   Alcohol use: Not Currently    Comment: occasional when hanging out with the wrong people No recent use.   Drug use: Not Currently    Types: Marijuana    Comment: occasional, last 2013   Sexual activity: Not Currently  Other Topics Concern   Not on file  Social History Narrative   Pt lives at home with his wife    Right handed    Caffeine- hardly any   Social Drivers of Corporate investment banker Strain: Low Risk  (10/01/2023)   Received from [facility]   Overall Financial Resource Strain (CARDIA)     Difficulty of Paying Living Expenses: Not very hard  Food Insecurity: Food Insecurity Present (10/01/2023)   Received [facility]   Hunger Vital Sign    Within the past 12 months, you worried that your food would run out before you got the money to buy more.: Sometimes true    Within the past 12 months, the food you bought just didn't last and you didn't have money to get more.: Never true  Transportation Needs: No Transportation Needs (06/30/2023)   Received from [facility] - Transportation    In the past 12 months, has lack of transportation kept you from medical appointments or from getting medications?: No    Lack of Transportation (Non-Medical): No  Physical Activity: Inactive (03/20/2023)   Exercise Vital Sign    Days of Exercise per Week: 0 days    Minutes of Exercise per Session: 0 min  Stress: Stress Concern Present (03/31/2023)   [employer] of Occupational Health - Occupational Stress Questionnaire    Feeling of Stress : To some extent  Social Connections: Moderately Integrated (03/20/2023)   Social Connection and Isolation Panel    Frequency of Communication with Friends and Family: Once a week    Frequency of Social Gatherings with Friends and Family: Never    Attends Religious Services: More than 4 times per year    Active Member of Clubs or Organizations: Yes    Attends Engineer, structural: More than 4 times per year    Marital Status: Married    Allergies:  No Known Allergies  Objective:    Vital Signs:   Temp:  [98.4 F (36.9 C)] 98.4 F (36.9 C) (08/11 1419) Pulse Rate:  [93] 93 (08/11 1419) Resp:  [16] 16 (08/11 1419) BP: (142)/(100) 142/100 (08/11 1419) SpO2:  [100 %] 100 % (08/11 1419)   Weight change: There were no vitals filed for this visit.  Intake/Output:  Intake/Output Summary (Last 24 hours) at 11/24/2023 1543 Last data filed at 11/24/2023 1504 Gross per 24 hour  Intake --   Output 300 ml  Net -300 ml    Physical Exam    General: Well appearing. No distress on RA Cardiac: JVP flat.  S1 and S2 present. No murmurs or rub. Extremities: Warm and dry.  No edema.  Neuro: Alert and oriented x3. Affect pleasant. Unable to move L side.  Medications:    Current Medications:  atorvastatin   10 mg Oral Q supper   budesonide  (PULMICORT ) nebulizer solution  0.25 mg Nebulization BID   calcium  acetate  1,334 mg Oral TID WC   carvedilol   12.5 mg Oral BID WC   cycloSPORINE  modified  125 mg Oral BID   [START ON 11/25/2023] ezetimibe   10 mg Oral Daily   heparin  injection (subcutaneous)  5,000 Units Subcutaneous Q8H   insulin  aspart  0-5 Units Subcutaneous QHS   insulin  aspart  0-9 Units Subcutaneous TID WC   insulin  aspart  5 Units Subcutaneous TID WC   insulin  glargine  26 Units Subcutaneous Q2200   levETIRAcetam   1,500 mg Oral BID   [START ON 11/25/2023] lidocaine   2 patch Transdermal Q24H   melatonin  3 mg Oral QHS   OXcarbazepine   300 mg Oral Daily   OXcarbazepine   600 mg Oral QHS   [START ON 11/25/2023] pantoprazole   40 mg Oral Daily   [START ON 11/25/2023] predniSONE   5 mg Oral Q breakfast   pregabalin   75 mg Oral QHS   Ensure Max Protein  11 oz Oral BID   protein supplement  1 Scoop Oral TID WC   [START ON 11/25/2023] sulfamethoxazole -trimethoprim   1 tablet Oral Daily   traZODone   50 mg Oral QHS   [START ON 11/25/2023] valGANciclovir   900 mg Oral Daily    Infusions:   Patient Profile   38 y.o. 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  Assessment/Plan   HFrEF d/t NICM s/p OHT 8/24 - s/p OHT 11/26/22. Last EMBx in 3/25 negative with normal EF.  - TTE pre-arrest with EF 40% and severely reduced RV function - s/p PEA arrested (see below) - s/p RHC 10/15/23 with normal CI and elevated PCWP.  EMBx taken 7/2 - TTE 7/29 with normal biV function.  - euvolemic on exam - continue cyclosporine  125 mg bid. CYA goal 150-200 - continue bactrim  (end 11/26/23) - continue prednisone  5 mg daily - continue atorva 10 mg at bedtime + zetia  10 mg daily - continue coreg  12.5 mg bid   CMV viremia - CMV PCR 109k in 6/25. - induced with ganciclovir - continue valganciclovir  900 mg daily (plan for 3 months)  PEA arrest on 10/01/23 - s/p VA ECLS transitioned to VV ECMO 6/21. De-cannulated 6/25. Trach placed.   - presumed respiratory etiology, possibly d/t unwitnessed seizure - ESBL Kleb HCAP s/p meropenem  - trach de-cannulated 8/4  H/o CVA - stroke with hemorrhagic conversion in 2022 and again in 4/25 - residual L sided deficits and seizures - continue keppra  and trileptal    Length of Stay: 0  [place] Dewey Neukam, NP  11/24/2023, 3:43 PM  Advanced Heart Failure Team Pager [phone] (M-F; 7a - 5p)  Please contact [facility] for night-coverage after hours (4p -7a ) and weekends on [email or web address]

## 2023-11-24 NOTE — Progress Notes (Signed)
 Case Manager Discharge Summary / Closing Note  Expected Discharge Date & Time: 11/24/2023 at 11 am to 2 pm  Discharge Plan:  The patient has been involved in the development of this plan and is in agreement.    Post-Acute Services Coordinated: Destination - Admitted Since 09/30/2023     Service Provider Services Address Phone Fax Patient Preferred   [place]  Inpatient Rehabilitation [address], [place] [address] [phone] --      Disposition: Discharge Disposition Resource: Acute/Inpatient Rehab Facility Information Discussed Information Discussed: Alternatives to long term care placement, Differences in Levels of Care Acute/Inpatient Rehab Facility Agency Name: [facility] Contact Name: Leita or Tinnie Accepting Physician: Dr. Babs Phone/Fax: [phone] Pt/Family provided a list of acute rehabilitation facilities with quality data within their preferred service area: Acute rehabilitation facility list accepted Pt/Family Preference: Preference (see progress notes and resource information)   Funding for Discharge Medications: Drug assistance not needed   Transportation: Arrangements: hospital coordinated Service Arranged: ambulance Time: 1200 Phone/Fax: [phone] Vendor: [facility]  Final ADT:  Final ADT Discharge Disposition: Facility/Hospital Based        Facility/Hospital Based: Inpatient Acute Rehab Facility (IRF), i.e [facility]           Second IMM Received: Not indicated (11/24/23 1206)  Final Summary: CM received updates from medical team, pt is medically ready for transfer to acute rehab today. CM confirmed transportation with [facility] at 12pm via [employer]. CM spoke with Leita [phone]) in admission at [facility], confirmed that they are able to accept patient today. CM faxed d/c summary to Leita at [phone] per her request. No additional needs or concerns  identified at this time.   Carlo Cassis, LCSW, CCM, CCTSW-MCS, MPH Case Manager/Clinical Social Worker [facility] Phone: [phone] Pager: [phone]

## 2023-11-24 NOTE — H&P (Signed)
 Physical Medicine and Rehabilitation Admission H&P     CC: Functional deficits due to multiple medical issues.     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  He had an 8-day length of stay according to the patient.  He was discharged to home ambulatory.  Patient could not provide any further info regarding his functional status between his discharge from [facility] and his stroke sustained in [place].  Thinks he was probably at home but did not know for sure.  He had a short 3-day hospitalization for weakness in March 2025 graft rejection and infection without.  The patient went home again, was visiting [place] 07/25/2023 when he developed a right basal ganglia hemorrhage and admitted to the hospital admission.  He was transferred to inpatient rehab and he was discharged to home 09/18/23.  Because of weakness the patient was taken to [facility] ED on 09/30/23 with hypotension, diarrhea, weakness, lethargy and felt to be septic therefore transferred to [facility] for management. Hospital course significant for PEA arrest  with 5 rounds of CPR w/ROSC, repeat arrest 06/18 requiring ECMO thorough 06/21.  He was started on broad spectrum antibiotics. 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 Hospital course significant for respiratory arrest question due to seizures requiring intubation and eventually tracheostomy, ESBL Kleb HCAP, CMV viremia, bilateral pleural effusions s/p pigtail catheter drainage, renal failure requiring HD, required multiple units PRBCs, superficial thrombus right basilic vein.   The patient had a tracheostomy while at [facility] and was decannulated on 11/17/2022.  The patient had Dobbhoff feeding tube while at [facility] and after passing his F EES on 717 his tube was removed on 11/12/2023.  Chest tubes were removed on  11/16/2023.   In terms of CMV infection, Treated with induction dose ganciclovir and transitioned to maintenance dose on 07/26 w/last CMV PCR <260 and planss for 3 month course (~02/08/24) with every 2 week CMV PCR upon cessation X 3 months.Diet has been advanced to regular textures w/thin liquids. Renal status improved and he continues to renal diet due to elevated phosphorous with binders TID.  Cardiology and endocrinology has been following to assist with medical issues. Therapy was consulted and has been working with patient on pregait activity. He requires min to max assist overall and CIR recommended due to recent decline.    Review of Systems  Constitutional:  Positive for malaise/fatigue. Negative for chills and fever.  HENT:  Negative for nosebleeds.   Eyes:  Negative for discharge and redness.  Respiratory:  Negative for cough, shortness of breath, wheezing and stridor.   Cardiovascular:  Negative for chest pain and leg swelling.  Gastrointestinal:  Positive for constipation. Negative for nausea and vomiting.  Genitourinary:  Positive for urgency.  Musculoskeletal:  Negative for back pain and joint pain.  Skin:  Negative for itching.  Neurological:  Positive for sensory change and focal weakness.  Endo/Heme/Allergies:  Does not bruise/bleed easily.  Psychiatric/Behavioral:  Positive for memory loss.             Past Medical History:  Diagnosis Date   Chronic systolic CHF (congestive heart failure) (HCC) 06/18/2015   Heart transplant recipient [facility])     Hemorrhagic stroke (HCC)     Hyperlipidemia     Hypertension     Immune deficiency disorder (HCC)  Neuropathy     Nonischemic cardiomyopathy (HCC) Noted as early as 2007    Per chart review (cards consult note 2011), EF of 40% in 2007, down to 20-25% in 2011   Seizures (HCC)     Stroke (cerebrum) (HCC)     Type 2 diabetes mellitus (HCC)                 Past Surgical History:  Procedure Laterality Date   ICD IMPLANT  N/A 08/27/2021    Procedure: ICD IMPLANT;  Surgeon: Cindie Ole DASEN, MD;  Location: [facility] INVASIVE CV LAB;  Service: Cardiovascular;  Laterality: N/A;   None       RIGHT HEART CATH   06/21/2021    [facility]   RIGHT HEART CATH N/A 05/17/2022    Procedure: RIGHT HEART CATH;  Surgeon: Rolan Ezra RAMAN, MD;  Location: [facility] INVASIVE CV LAB;  Service: Cardiovascular;  Laterality: N/A;   RIGHT/LEFT HEART CATH AND CORONARY ANGIOGRAPHY N/A 11/02/2020    Procedure: RIGHT/LEFT HEART CATH AND CORONARY ANGIOGRAPHY;  Surgeon: Rolan Ezra RAMAN, MD;  Location: [facility] INVASIVE CV LAB;  Service: Cardiovascular;  Laterality: N/A;               Family History  Problem Relation Age of Onset   Stroke Mother     Diabetes Mother     Hypertension Mother     Stroke Maternal Aunt     Heart attack Neg Hx            Social History:  reports that he has never smoked. He has never used smokeless tobacco. He reports that he does not currently use alcohol. He reports that he does not currently use drugs after having used the following drugs: Marijuana.     Allergies:  Allergies  No Known Allergies             Medications Prior to Admission  Medication Sig Dispense Refill   ACCU-CHEK GUIDE test strip USE TO CHECK BLOOD SUGAR THREE TIMES DAILY. 100 each 2   acetaminophen  (TYLENOL ) 325 MG tablet Take 1-2 tablets (325-650 mg total) by mouth every 4 (four) hours as needed for mild pain.       amLODipine (NORVASC) 5 MG tablet Take 5 mg by mouth daily.       aspirin  EC 81 MG tablet Take 81 mg by mouth daily. Swallow whole.       Blood Glucose Monitoring Suppl (ACCU-CHEK GUIDE ME) w/Device KIT USE TO CHECK BLOOD SUGAR THREE TIMES DAILY. 1 kit 0   Blood Pressure Monitor DEVI Use as directed to check home blood pressure 2-3 times a week 1 Device 0   Calcium  Carbonate-Vitamin D (CALTRATE 600+D PO) Take 600 mg by mouth in the morning and at bedtime.       Continuous Glucose Sensor (FREESTYLE LIBRE 2 SENSOR) MISC UAD 6  each 1   DULoxetine  (CYMBALTA ) 60 MG capsule Take 60 mg by mouth daily.       EASY COMFORT PEN NEEDLES 32G X 4 MM MISC USE TO INJECT INSULIN  FOUR TIMES A DAY 200 each 2   ezetimibe  (ZETIA ) 10 MG tablet Take 10 mg by mouth daily.       furosemide  (LASIX ) 20 MG tablet TAKE 1 TABLET (20 MG TOTAL) BY MOUTH 3 (THREE) TIMES A WEEK. MONDAYS WEDNESDAYS AND FRIDAY (Patient taking differently: Take 20 mg by mouth daily as needed for edema.) 30 tablet 11   GLUCAGON NA Place 3 sprays into the  nose once as needed.       insulin  glargine (LANTUS ) 100 UNIT/ML injection Inject 28 Units into the skin daily.       insulin  lispro (HUMALOG  KWIKPEN) 100 UNIT/ML KwikPen Inject 14-16 Units into the skin. 14 am 16 noon and pm       Insulin  Syringe-Needle U-100 (RELION INSULIN  SYRINGE) 31G X 15/64 0.3 ML MISC Use to inject insulin  daily. 100 each 11   levETIRAcetam  (KEPPRA ) 750 MG tablet TAKE 2 TABLETS BY MOUTH 2 (TWO) TIMES DAILY (2AM+ 2EVENING) (Patient taking differently: Take 1,500 mg by mouth 2 (two) times daily.) 360 tablet 1   magnesium  oxide (MAG-OX) 400 (240 Mg) MG tablet Take 1 tablet by mouth 2 (two) times daily. (Patient not taking: Reported on 06/29/2023)       mycophenolate (CELLCEPT ) 250 MG capsule Take 750 mg by mouth every 12 (twelve) hours.       Oxcarbazepine  (TRILEPTAL ) 300 MG tablet TAKE 2 TABLETS (600 MG TOTAL) BY MOUTH 2 (TWO) TIMES DAILY (AM+EVENING) (Patient taking differently: Take 300-600 mg by mouth See admin instructions. Take 1 tablet by mouth every morning then take 2 tablets by mouth before bedtime.) 360 tablet 1   pantoprazole  (PROTONIX ) 40 MG tablet Take 40 mg by mouth daily.       predniSONE  (DELTASONE ) 5 MG tablet Take 5 mg by mouth daily with breakfast. Take 1 tablets once a day       pregabalin  (LYRICA ) 150 MG capsule Take 150 mg by mouth at bedtime.       propranolol (INDERAL) 10 MG tablet Take 10 mg by mouth 2 (two) times daily.       rosuvastatin  (CRESTOR ) 10 MG tablet Take 20 mg by  mouth daily.       senna-docusate (SENOKOT-S) 8.6-50 MG tablet Take 1-2 tablets by mouth 2 (two) times daily as needed for mild constipation.       sulfamethoxazole -trimethoprim  (BACTRIM ) 400-80 MG tablet Take 1 tablet by mouth daily.       tacrolimus  (PROGRAF ) 1 MG capsule Take 5-6 mg by mouth See admin instructions. Take 6 capsules in the morning and 5 capsules every evening       valGANciclovir  (VALCYTE ) 450 MG tablet Take by mouth daily. Take 2 tablets by mouth every evening for 180 days (Patient not taking: Reported on 06/29/2023)                  Home:   Functional History:     Functional Status:  Mobility: +2 mod assist for bed mobility.  +2 max assist for transfers with [facility]       ADL: Mod assist upper body care Max assist lower body care   Cognition:     Labs prior to admission   11/24/23- CBC: Hgb-  9.0    Hct -  27.8    WBC - 3.6   Plt- 298 11/24/23  BMET:   Na- 135    K-4.1   Cl-103  CO2- 26   BUN- 17   SCr- 1.1   11/23/23  LFTs:   AST- 22  ALT - 20  TB -0.4  Alb- 2.5  Aphos- 69 8/11 Mg- 1.6 ( Trend since 08/07-->2.0-->1.4-->1.5-->1.5-->1.6) 08/11 Phos- 5.0   (Trend since 08/07-->5.6-->5.3-->5.1-->4.7-->4.5-->5.0)     Blood pressure (!) 142/100, pulse 93, temperature 98.4 F (36.9 C), temperature source Oral, resp. rate 16, SpO2 100%. Physical Exam  HEENT trach site well healed , dressing removed  General: No acute distress Mood  and affect are appropriate Heart: Regular rate and rhythm no rubs murmurs or extra sounds Lungs: Clear to auscultation, breathing unlabored, no rales or wheezes Abdomen: Positive bowel sounds, soft nontender to palpation, nondistended Extremities: No clubbing, cyanosis, or edema Skin: No evidence of breakdown, no evidence of rash Neurologic: Cranial nerves II through XII intact, motor strength is 4/5 in right deltoid, bicep, tricep, grip, hip flexor, knee extensors, ankle dorsiflexor and plantar flexor LUE  2 -/5 left deltoid,  bicep, tricep, finger flexors and trace extensors. Left lower extremity 3 - hip flexor 4 - knee extensor 0 ankle dorsiflexor and plantar flexor Sensory exam normal sensation to light touch is reduced on the left side below the knee as well as in the left upper extremity cerebellar exam normal finger to nose to finger as well as heel to shin in bilateral upper and lower extremities Musculoskeletal: Full range of motion in all 4 extremities. No joint swelling    Blood pressure (!) 142/100, pulse 93, temperature 98.4 F (36.9 C), temperature source Oral, resp. rate 16, SpO2 100%.   Medical Problem List and Plan: 1. Functional deficits secondary to debility following PEA arrest             -patient may  shower             -ELOS/Goals: 18-21d MinA 2.  Antithrombotics: -DVT/anticoagulation:  Pharmaceutical: Lovenox              -antiplatelet therapy: N/A due to IPH 3. Pain Management: Tylenol  prn Been on Lyrica  at home for diabetic neuropathic pain. 4. Mood/Behavior/Sleep: LCSW to follow for evaluation and support.              -antipsychotic agents: N/A 5. Neuropsych/cognition: This patient is not capable of making decisions on his own behalf without input from [facility] 6. Skin/Wound Care: Routine pressure relief measures. Monitor incision for healing.  7. Fluids/Electrolytes/Nutrition: Strict I/O. Renal diet with CM restrictions --no fluid restriction needed 8. PEA arrest X 2/CMV viremia: on Valcyte  900 mg daily thru 10/26 with monitoring of levels every 2 weeks post completion.  Pharmacy consult  9. Heart transplant: On Gengraf  BID with prednisone  daily             --continue Coreg , lower dose Lipitor  and Zetia .  Heart Failure team will follow  10. Elevated phosphorous: Will keep on liberalized diet for food choices and to help with intake             --continue Phoslo  1334 tid ac--recheck level in am. 11. Hx seizures: Has been stable on Keppra  and Trileptal .  12. 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  Monitor BS ac/hs. Continue Insulin  glargine with novolog  tid ac             --Use SSI for elevated BS and modify diet once intake stable/consistent. 13. Anemia of chronic disease: Monitor H/H for stability.       Sharlet GORMAN Schmitz, PA-C 11/24/2023  I have personally performed a face to face diagnostic evaluation of this patient.  Additionally, I have reviewed and concur with the physician assistant's documentation above.  Prentice CHARLENA Compton M.D. [facility] Fellow Am Acad of Phys Med and Rehab Diplomate Am Board of Electrodiagnostic Med Fellow Am Board of Interventional Pain

## 2023-11-24 NOTE — Progress Notes (Signed)
 Occupational Therapy Occupational Therapy Progress Note  Patient Name:  Jason Stevenson Date of Treatment: 11/24/23 Time of Treatment:  1150 Duration of Session:  23 Minutes Room/Bed: 7309/7309-01  Precautions: Falls Risk, Isolation Isolation Precautions: Contact   Assessment: Pt demonstrating overall improvement with functional mobility and ADL performance during today's session. He continues to perform supine>sitting transfer well with self-assisted LE hook method. Patient is assisted in donning underwear and pants at EOB in anticipation of discharge today. Patient requires max assist to perform in a timely manner prior to arrival of transport. He stands with 2 person moderate assistance to pull up pants. Patient then scoots toward [facility] with standby assist and verbal cuing (toward strong R side). He is setup to brush his teeth at EOB before returning to supine with moderate assist for LE management. Patient is making steady progress towards therapy goals and will continue to benefit from acute rehab upon discharge to address functional deficits. Patient is highly motivated and is an excellent candidate for intensive acute rehab to maximize independence and safety upon return home.  The patient will continue to benefit from the skills of an occupational therapist to address Decreased Basic ADLs/Self-care, Decreased IADLs, Decreased endurance/activity intolerance, Decreased functional mobility, Decreased cognition, Decreased strength, Decreased ROM/flexibility, Impaired motor control, Decreased Balance, Communication.  Recommendations for performance of self care with nursing:  Recommend patient is safe to transfer to bedside commode with 2 person assist and use of sara stedy for toileting tasks. Strategies for increasing daily activity engagement: Sit up in recliner or bed in chair position 2-3x/day. Encourage patient to complete morning and bedtime grooming routine and to assist in bathing  and dressing as able Walk or mobilize if appropriate  Turn lights on/open window shades during the day Complete arm and leg exercises Use BMAT score and associated clinical judgement to determine safe mobility on a daily basis as patient status may be subject to change.   Discharge Recommendations: Is the patient safe to discharge to the recommended disposition? Yes Discharge Recommendations: Acute rehab  Darryel is a great acute rehab candidate due to age, has ADL goals, has IADL goals, and good family/friend support  Complete details of today's session: Medical chart reviewed and RN consulted prior to session. Pt received semi-reclined in bed and agreeable to treatment. Please refer to flowsheet for specific details of today's session. At end of session, the patient was left semi-reclined in bed, with all needs in reach, with nurse call device in reach, with bed/chair alarm system engaged. His status was communicated to the RN, Patient.    11/24/23 1150  Discipline Timestamp  Discipline Timestamp OT  Documentation Type  Documentation Type                                E,R, T   Treatment  Patient Subjective Information  Patient Subjective Information Patient agreeable to therapy  Patient/Family Goals  Patient/Family Goals Return to previous lifestyle  Precautions  Precautions Falls Risk;Isolation  Isolation Precautions Contact  Pain Assessment  Pain Assessment %% 0-10  Pain Score %% Zero  Activity At Time Of Vitals Measurement  Activity Rest  Oxygen Therapy  Any supplemental oxygen? No  Cognition     Client Factors/Performance Skills  Overall Cognitive Status WNL  Arousal/Alertness Alert  Attention Span Attends with cues to redirect  Following Commands Follows multi-step commands;Consistently  Behavior Cooperative  Safety Judgment Patient at risk for falls  Communication WNL  Adult OT Outcomes  Highest Level of Function Yes  Highest Level of ADLs 7 - Bathing/dressing  lower body  ADL Activity Code 5 - In standing  Inpatient AM-PAC Performed Daily Activity Inpatient Short Form  AM-PAC 6 Clicks Daily Activity Inpatient Short Form  Putting on and taking off regular lower body clothing? 2-A Lot  Bathing (including washing, rinsing, drying)? 2-A Lot  Toileting, which includes using toilet, bedpan, or urinal? 2-A Lot  Putting on and taking off regular upper body clothing? 2-A Lot  Taking care of personal grooming such as brushing teeth? 3-A Little  Eating meals? 3-A Little  AM-PAC Daily Activity Raw Score 14  AM-PAC Daily Activity t-Scale Score 33.39  Daily Activity G-Code Modifier CK  Balance  Sitting/Standing Balance  Standing Dynamic       Dynamic Standing Activities Basic ADLs       Dynamic Standing Assistance Minimal (x2)  Standing Assistive Devices Other (Comment) (HHAx2)  Mobility  Bed Mobility  Supine to Sit;Sit to Supine;Sit to Stand;Stand to Sit;Scooting       Scooting Assistance Stand by assist       Scooting Details Requires extra time;Set up;Verbal cues;Management of lines and tubes       Number of People Required 1       Supine to Sit Assistance Stand by assist       Supine to Sit Details Requires extra time;Head of bed flat (RLE lifting LLE to advance to EOB)       Number of People Required 1       Sit to Supine Assistance Moderate assist (pt. performs 50-75%)       Sit to Supine Details Requires extra time;Set up;Verbal cues;Management of lines and tubes       Number of People Required 1       Sit to Stand Assistance Moderate assist (pt. performs 50-75%)       Sit to Stand Details Requires extra time;Verbal cues       Number of People Required 2       Sit to Stand Assistive Devices Other (Comment) (OTA/RN assist)       Stand to Sit Assistance Minimal assist (pt. performs > 75%)       Stand to Sit Details Requires extra time;Verbal cues       Number of People Required 2       Stand to Sit Assistive Devices Other  (Comment) (OTA/RN assist)  Basic ADLs            Analysis of Occupational Performance    Dressing Location Sitting;At edge of bed;Standing    Dressing - Lower Body Maximal Assist (Pt. performs 25-49%);Verbal Cues    LE Dressing Deficit Thread RLE into underwear;Thread LLE into underwear;Thread RLE into pants;Thread LLE into pants;Pull up over hips  Grooming Location Sitting;At edge of bed  Grooming Activity Brushing teeth  Brushing Teeth Set up assist  Patient Status At End of Session  Status Communicated to: RN;Patient  Pt Left: semi-reclined in bed;with all needs in reach;with nurse call device in reach;with bed/chair alarm system engaged  Assessment  OT Enhancers Rehab experience;Age;Good family support/resources;Motivated  OT Barriers Decreased level of alertness;Impaired cognition;Low activity tolerance  Impairments  Decreased Basic ADLs/Self-care;Decreased IADLs;Decreased endurance/activity intolerance;Decreased functional mobility;Decreased cognition;Decreased strength;Decreased ROM/flexibility;Impaired motor control;Decreased Balance;Communication  Rehab Potential  Good  Pt.safe for Discharge/OT perspective? Yes  Summary of Findings  Tolerated treatment well  Plan  Treatment/Interventions Transfers;Basic self care;Functional cognition;Energy Conservation;Therapeutic exercise;Functional  mobility;Bed mobility;Patient and family education;Cognitive Retraining/integration  OT Frequency 4x per week  Discharge Recommendation (DUH/DRH) Acute rehab  Acute Rehab Justifications Patient demonstrates need for acute, intensive combined therapies, 3 hrs/day each day.;Occupational therapy services are indicated twice a day, each day.;The patient has potential to return to an independent level of function;The patient has family support;The patient has goals for basic ADLs (bathing and dressing);The patient has goals for IADLs (Cooking, cleaning, and laundry)  DME Recommendations Per accepting  facility  Plan (Progress Note) Continue OT per written plan of care   Please see OT overview for patient education completed today.  JOANNA ZARONAS, COTA/L  Pager [phone]

## 2023-11-24 NOTE — Care Plan (Signed)
 Patient had no changes throughout the night. Plan of care ongoing. Problem: Patients that are at an increased risk for delirium. Goal: Mobility:  The patient will be mobile. Outcome: Progressing Goal: Orientation:  The patient will be oriented to person, time and place in the environment Outcome: Progressing Goal: Sleep, rest, and comfort: The patient will be comfortable and rested. Outcome: Progressing Goal: Hydration and nutrition: The patient will be hydrated and nourished. Outcome: Progressing Goal: Collaboration and communication: The patient's status will be effectively communicated to the rest of the care team. Outcome: Progressing   Problem: Skin Integrity Impairment for Adult populations Goal: Sensory Perception Description: All Adult patients at medium/high risk based on Braden risk assessment subscale will not develop a pressure injury. Implement prevention interventions based on risk assessment sub-scale scores: Outcome: Progressing Goal: Moisture Description: All Adult patients at medium/high risk based on Braden risk assessment subscale will not develop a pressure injury. Implement prevention interventions based on risk assessment sub-scale scores: Outcome: Progressing Goal: Activity Description: All Adult patients at medium/high risk based on Braden risk assessment subscale will not develop a pressure injury. Implement prevention interventions based on risk assessment sub-scale scores: Outcome: Progressing Goal: Mobility Description: All Adult patients at medium/high risk based on Braden risk assessment subscale will not develop a pressure injury. Implement prevention interventions based on risk assessment sub-scale scores: Outcome: Progressing Goal: Nutrition Description: All Adult patients at medium/high risk based on Braden risk assessment subscale will not develop a pressure injury. Implement prevention interventions based on risk assessment sub-scale scores: Outcome:  Progressing Goal: Friction & Shear Description: All Adult patients at medium/high risk based on Braden risk assessment subscale will not develop a pressure injury. Implement prevention interventions based on risk assessment sub-scale scores: Outcome: Progressing   Problem: Skin Integrity Impairment for Pediatrics populations (includes neonates) Goal: Mobility Description: All Pediatric patients at medium/high risk based on Braden QD risk assessment subscale will not develop a pressure injury. Implement prevention interventions based on risk assessment sub-scale scores: Outcome: Progressing Goal: Sensory Perception Description: All Pediatric patients at medium/high risk based on Braden QD risk assessment subscale will not develop a pressure injury. Implement prevention interventions based on risk assessment sub-scale scores: Outcome: Progressing Goal: Friction & Shear Description: All Pediatric patients at medium/high risk based on Braden QD risk assessment subscale will not develop a pressure injury. Implement prevention interventions based on risk assessment sub-scale scores: Outcome: Progressing Goal: Nutrition Description: All Pediatric patients at medium/high risk based on Braden QD risk assessment subscale will not develop a pressure injury. Implement prevention interventions based on risk assessment sub-scale scores: Outcome: Progressing Goal: Tissue Perfusion & Oxygenation Description: All Pediatric patients at medium/high risk based on Braden QD risk assessment subscale will not develop a pressure injury. Implement prevention interventions based on risk assessment sub-scale scores: Outcome: Progressing Goal: Medical Devices Description: All Pediatric patients at medium/high risk based on Braden QD risk assessment subscale will not develop a pressure injury. Implement prevention interventions based on risk assessment sub-scale scores: Outcome: Progressing Goal: Repositionability / Skin  Protection Description: All Pediatric patients at medium/high risk based on Braden QD risk assessment subscale will not develop a pressure injury. Implement prevention interventions based on risk assessment sub-scale scores: Outcome: Progressing   Problem: Active wounds Goal: Active wounds will progress to healing Description: Assess skin integrity with a head-to-toe skin assessment on admission to hospital/unit, Q shift, and/or when off the unit >4 hours. Outcome: Progressing   Problem: Skin integrity knowledge deficit Goal: Will be  able to teach-back wound care and/or pressure injury prevention strategies Description: Assess skin integrity with a head-to-toe skin assessment on admission to hospital/unit, Q shift, and/or when off the unit >4 hours. Outcome: Progressing   Problem: Hemodynamic Instability: Goal: Patient will maintain optimal hemodynamic status. Outcome: Progressing   Problem: Vascular Compromise: Goal: Patient will maintain adequate tissue perfusion distal to insertion site of device/procedure. Description: IABP, ECMO, sheaths, vascular access, arterial lines Outcome: Progressing Goal: Decrease patient's  risk for venous thrombosis. Outcome: Progressing   Problem: Cognitive: Goal: Patient will maintain baseline neurological and functional status. Outcome: Progressing   Problem: Respiratory: Goal: Patient will maintain adequate gas exchange. Outcome: Progressing   Problem: Immunosupression: Goal: Patient will not have occurrences of rejection or active infection. Outcome: Progressing Goal: Demonstration of healing of incision without infection will stabilize. Outcome: Progressing   Problem: Activity: Goal: Ability to meet self-care needs will improve. Outcome: Progressing Goal: Ability to tolerate increased activity will improve. Outcome: Progressing   Problem: Bowel/Gastric/Renal: Goal: Patient will maintain adequate renal function. Outcome:  Progressing Goal: Establishment of normal bowel function will improve. Outcome: Progressing   Problem: Nutritional: Goal: Ability to attain and maintain optimal nutritional status will be restored. Outcome: Progressing   Problem: Skin Integrity: Goal: Implement precautions to protect skin integrity. Outcome: Progressing Goal: Provide skin care. Outcome: Progressing   Problem: Sensory: Goal: Assess pain status. Outcome: Progressing Goal: Monitor location of pain. Outcome: Progressing Goal: Pain level will decrease. Outcome: Progressing Goal: Assess effects of pain control measures. Outcome: Progressing Goal: Instruct to immediately report any chest discomfort or pain. Outcome: Progressing Goal: Satisfaction with pain management regimen will stabilize. Outcome: Progressing   Problem: All patients at High risk for falls Goal: Patient will remain free of falls. Outcome: Progressing   Problem: Medications Goal: Patient will mobilize safely without falling Outcome: Progressing   Problem: Risk for Injury related to restraint use Goal: Remains free from restraints Outcome: Progressing Goal: Patient will remain free from injury while in restraints Outcome: Progressing   Problem: Patients that are at an increased risk for delirium. Goal: Mobility:  The patient will be mobile. Outcome: Progressing Goal: Orientation:  The patient will be oriented to person, time and place in the environment Outcome: Progressing Goal: Sleep, rest, and comfort: The patient will be comfortable and rested. Outcome: Progressing Goal: Hydration and nutrition: The patient will be hydrated and nourished. Outcome: Progressing Goal: Collaboration and communication: The patient's status will be effectively communicated to the rest of the care team. Outcome: Progressing

## 2023-11-24 NOTE — H&P (Unsigned)
    Physical Medicine and Rehabilitation Admission H&P    No chief complaint on file. : HPI: ***  ROS Past Medical History:  Diagnosis Date   Chronic systolic CHF (congestive heart failure) (HCC) 06/18/2015   Hemorrhagic stroke (HCC)    Hyperlipidemia    Hypertension    Immune deficiency disorder (HCC)    Nonischemic cardiomyopathy (HCC) Noted as early as 2007   Per chart review (cards consult note 2011), EF of 40% in 2007, down to 20-25% in 2011   Past Surgical History:  Procedure Laterality Date   ICD IMPLANT N/A 08/27/2021   Procedure: ICD IMPLANT;  Surgeon: Cindie Ole DASEN, MD;  Location: [facility] INVASIVE CV LAB;  Service: Cardiovascular;  Laterality: N/A;   None     RIGHT HEART CATH  06/21/2021   [facility]   RIGHT HEART CATH N/A 05/17/2022   Procedure: RIGHT HEART CATH;  Surgeon: Rolan Ezra RAMAN, MD;  Location: [facility] INVASIVE CV LAB;  Service: Cardiovascular;  Laterality: N/A;   RIGHT/LEFT HEART CATH AND CORONARY ANGIOGRAPHY N/A 11/02/2020   Procedure: RIGHT/LEFT HEART CATH AND CORONARY ANGIOGRAPHY;  Surgeon: Rolan Ezra RAMAN, MD;  Location: [facility] INVASIVE CV LAB;  Service: Cardiovascular;  Laterality: N/A;   Family History  Problem Relation Age of Onset   Stroke Mother    Diabetes Mother    Hypertension Mother    Stroke Maternal Aunt    Heart attack Neg Hx    Social History:  reports that he has never smoked. He has never used smokeless tobacco. He reports that he does not currently use alcohol. He reports that he does not currently use drugs after having used the following drugs: Marijuana. Allergies: No Known Allergies (Not in a hospital admission)     Home:     Functional History:    Functional Status:  Mobility:          ADL:    Cognition:      Physical Exam: There were no vitals taken for this visit. Physical Exam  No results found for this or any previous visit (from the past 48 hours). No results found.    There were no vitals taken  for this visit.  Medical Problem List and Plan: 1. Functional deficits secondary to ***  -patient may *** shower  -ELOS/Goals: *** 2.  Antithrombotics: -DVT/anticoagulation:  {VTE PROPHYLAXIS/ANTICOAGULATION - [id number]}  -antiplatelet therapy: *** 3. Pain Management: *** 4. Mood/Behavior/Sleep: ***  -antipsychotic agents: *** 5. Neuropsych/cognition: This patient *** capable of making decisions on *** own behalf. 6. Skin/Wound Care: *** 7. Fluids/Electrolytes/Nutrition: ***     ***  Sharlet RAMAN Schmitz, PA-C 11/24/2023

## 2023-11-24 NOTE — Progress Notes (Signed)
 Patient was AxO4 on RA with no complaints of pain. Patient VS were stable and R PICC was removed with no complications. Patient was picked up by [facility] care around 1215 via stretcher. Patient was safely moved to stretcher and taken to rehabilitation with discharge packet and all of belongings.

## 2023-11-24 NOTE — Progress Notes (Signed)
 Inpatient Rehabilitation Admission Medication Review by a Pharmacist  A complete drug regimen review was completed for this patient to identify any potential clinically significant medication issues.  High Risk Drug Classes Is patient taking? 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 Bactrim  SS - PJP prophylaxis s/p heart transplant  Opioid No   Antiplatelet No   Hypoglycemics/insulin  Yes Insulin  aspart (SSI) and glargine - DM Type 2  Vasoactive Medication Yes Carvedilol  - hypertension, heart failure  Chemotherapy No   Other Yes Atorvastatin , Ezetimibe  - hyperlipidemia Budesonide  nebs - respiratory support Calcium  acetate (PhosLo ) - hyperphosphatemia Cyclosporine  (Gengraf ), Prednisone  - immunosuppression post-heart transplant Levetiracetam , Oxcarbazepine  - hx seizures Lidocaine  patches - topical pain relief Pantoprazole  - GERD Pregabalin  - neuropathic pain Melatonin, Trazodone  - sleep  PRNs: Acetaminophen  - mild pain Maalox - indigestion Diclofenac  gel - topical pain relief Diphenhydramine  - itching Guafenesin-dextromethorphan - cough Duonebs - wheezing, shortness of breath Bisacodyl  PR, sorbitol, Fleets enema - constipation     Type of Medication Issue Identified Description of Issue Recommendation(s)  Drug Interaction(s) (clinically significant)     Duplicate Therapy     Allergy     No Medication Administration End Date  Noted intent to stop Bactrim  SS prophylaxis on 11/26/23, 1 year post-transplant.   Valacyclovir planned for 3 months. Noted begun 11/08/23. Discussed with P. Love, PA-C, continue for now.   Expected to stop ~02/08/24.  Incorrect Dose     Additional Drug Therapy Needed     Significant med changes from prior encounter (inform family/care partners about these prior to  discharge). New during inpatient stay: Cyclosporine , carevedilol, lidocaine  patches, Diclfenac gel, Calcium  acetate (PhosLo ), Melatonin, Valganciclovir .  Changes: Tacrolimus  to Cyclospororine due to neuro concerns and hemolysis. Propranolol to Carvedilol . Atorvastatin  dose reduced (due to interaction with cyclosporine ). Pregabalin  dose reduced. Insulin  glargine dose increased.  Discontinued: Amantadine, amlodipine, Calcium  + D, Duloxetine , tacrolimus , propranolol.  Held: Mycophenolate, due to infections complications during inpatient stay.  Communicate changes with patient/family prior to discharge.                 Noted plan to re-evalutate as outpatient per discharge summary.  Other       Clinically significant medication issues were identified that warrant physician communication and completion of prescribed/recommended actions by midnight of the next day:  No, non-urgent  Name of provider notified for non-urgent issues identified: P. Love, PA-C  Provider Method of Notification: face-to-face  Pharmacist comments:  - Noted intent to stop Bactrim  SS prophylaxis on 11/26/23, 1 year post-transplant.  Discussed with P. Love, PA-C, continue for now. - Valacyclovir to continue for 3 months, thru ~02/08/24, then CMV PCR q2wks x 3 months.   - Heart Failure team to follow for immunosuppression assistance and noted target Cyclosporine  levels 100-200.  Expect intermittent levels.  Time spent performing this drug regimen review (minutes):  [id number]   Genaro Niemann [place], [place] 11/24/2023 6:03 PM

## 2023-11-24 NOTE — Progress Notes (Addendum)
 PMR Admission Coordinator Pre-Admission Assessment   Patient: Jason Stevenson is an 38 y.o., male MRN: [id number] DOB: 1985/08/11 Height:   Weight:     Insurance Information HMO:     PPO:      PCP:      IPA:      80/20:      OTHER:  PRIMARY: [place] [employer] [facility]      Policy#:  [id number], Medicaid #: [id number]      Subscriber: Pt  approved 8.7.25 to 8.14.25, review due 8.14.25 CM Name:       Phone#: [phone]     Fax#: [phone] Pre-Cert#: [id number]       Employer:  Benefits:  Phone #:      Name:  Eff. Date: Effective: 09/14/2023 to 09/12/2024     Deduct:       Out of Pocket Max:       Life Max:  CIR:       SNF:  Outpatient:      Co-Pay:  Home Health:       Co-Pay:  DME:      Co-Pay:  Providers: in network   SECONDARY:       Policy#:       Phone#:    Artist:       Phone#:    The Data processing manager" for patients in Inpatient Rehabilitation Facilities with attached "Privacy Act Statement-Health Care Records" was provided and verbally reviewed with: N/A   Emergency Contact Information Contact Information       Name Relation Home Work Mobile    [place] Spouse     [phone]    Wilson,Rachel Sister [phone]             Other Contacts   None on File        Current Medical History  Patient Admitting Diagnosis: PEA arrest History of Present Illness: Jason Stevenson is a 38 y.o. 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 The patient had recently discharged [facility] in [place] where he completed rehab after he suffered a hemorrhagic stroke in April. At [facility], the patient presented with tachycardia into the 130's, hypotension, Tmax  100.7 and satting 95% on 2L [place]. CBC notable for WBC 13.3, Hb 13.1, PLT 199. Chemistry with Na 136, K 5.0, Cl 103, BUN 38, sCR 2.2, glucose 233. Lactate 2.3, lipase 18, He was given initial doses of cefepime , vancomycin , and flagyl . He received 1 L of fluid in the ED, and approximately 300cc en route before losing IV access. On admission, vitals were T 37 C (98.6 F), HR (!) 122, BP 106/65, RR 20, and satting 93 % on 2L. Labs notable for Na 135, K grossly hemolyzed, HCO3 17*, BUN 38*, sCr 2.2*. CBC notable for WBC 13.5*, Hgb 12.1*, plts 202.  He was ultimately admitted 09/30/23 with concern for sepsis.  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 RHC on  10/15/23 with  elevated L sided pressures, He had a follow up TTE 7/29 with normal bi-V function. He arrested on the  floor (5 rounds CPR with ROSC)on 6/17 and then again 10/01/23 where [facility]. He was decannulated from [place] ECMO on 6/21; however was hypoxemic intra-op and VV Ecmo was deployed. Left Chest tube was placed for pleural effusion on 10/04/23. Tracheostomy was performed and VV ECMO decannulated on 10/08/23. Right pigtail catheter placed for pleural effusion on 6/30. Jason Stevenson was decannulated 11/17/22. Pt. Initially with coretrak for nutrition. He passed FEES on 10/30/23 and has been on a regular diet with thin liquids. Dobhoff tube removed  11/12/23. Chest tubes were removed 11/16/2022. Pt. Seen by PT/OT/SLP and they recommend CIR to assist return to PLOF.      Patient's medical record from [facility]  has been reviewed by the rehabilitation admission coordinator and physician.   Past Medical History      Past Medical History:  Diagnosis Date   Chronic systolic CHF (congestive heart failure) (HCC) 06/18/2015   Hemorrhagic  stroke (HCC)     Hyperlipidemia     Hypertension     Immune deficiency disorder (HCC)     Nonischemic cardiomyopathy (HCC) Noted as early as 2007    Per chart review (cards consult note 2011), EF of 40% in 2007, down to 20-25% in 2011          Has the patient had major surgery during 100 days prior to admission? Yes   Family History   family history includes Diabetes in his mother; Hypertension in his mother; Stroke in his maternal aunt and mother.   Current Medications  Current Medications    Current Outpatient Medications:    ACCU-CHEK GUIDE test strip, USE TO CHECK BLOOD SUGAR THREE TIMES DAILY., Disp: 100 each, Rfl: 2   acetaminophen  (TYLENOL ) 325 MG tablet, Take 1-2 tablets (325-650 mg total) by mouth every 4 (four) hours as needed for mild pain., Disp: , Rfl:    amLODipine (NORVASC) 5 MG tablet, Take 5 mg by mouth daily., Disp: , Rfl:    aspirin  EC 81 MG tablet, Take 81 mg by mouth daily. Swallow whole., Disp: , Rfl:    Blood Glucose Monitoring Suppl (ACCU-CHEK GUIDE ME) w/Device KIT, USE TO CHECK BLOOD SUGAR THREE TIMES DAILY., Disp: 1 kit, Rfl: 0   Blood Pressure Monitor DEVI, Use as directed to check home blood pressure 2-3 times a week, Disp: 1 Device, Rfl: 0   Calcium  Carbonate-Vitamin D (CALTRATE 600+D PO), Take 600 mg by mouth in the morning and at bedtime., Disp: , Rfl:    Continuous Glucose Sensor (FREESTYLE LIBRE 2 SENSOR) MISC, UAD, Disp: 6 each, Rfl: 1   DULoxetine  (CYMBALTA ) 60 MG capsule, Take 60 mg by mouth daily., Disp: , Rfl:    EASY COMFORT PEN NEEDLES 32G X 4 MM MISC, USE TO INJECT INSULIN  FOUR TIMES A DAY, Disp: 200 each, Rfl: 2   ezetimibe  (ZETIA ) 10 MG tablet, Take 10 mg by mouth daily., Disp: , Rfl:    furosemide  (LASIX ) 20 MG tablet, TAKE 1 TABLET (20 MG TOTAL) BY MOUTH 3 (THREE) TIMES A WEEK. MONDAYS WEDNESDAYS AND FRIDAY (Patient taking differently: Take 20 mg by mouth daily as needed for edema.), Disp: 30 tablet, Rfl: 11   GLUCAGON NA, Place 3 sprays  into the nose once as needed., Disp: , Rfl:    insulin  glargine (LANTUS ) 100 UNIT/ML injection, Inject 28 Units into the skin daily., Disp: , Rfl:    insulin  lispro (HUMALOG  KWIKPEN) 100 UNIT/ML KwikPen, Inject 14-16 Units into the skin. 14 am 16 noon and  pm, Disp: , Rfl:    Insulin  Syringe-Needle U-100 (RELION INSULIN  SYRINGE) 31G X 15/64 0.3 ML MISC, Use to inject insulin  daily., Disp: 100 each, Rfl: 11   levETIRAcetam  (KEPPRA ) 750 MG tablet, TAKE 2 TABLETS BY MOUTH 2 (TWO) TIMES DAILY (2AM+ 2EVENING), Disp: 360 tablet, Rfl: 1   magnesium  oxide (MAG-OX) 400 (240 Mg) MG tablet, Take 1 tablet by mouth 2 (two) times daily. (Patient not taking: Reported on 06/29/2023), Disp: , Rfl:    mycophenolate (CELLCEPT ) 250 MG capsule, Take 750 mg by mouth every 12 (twelve) hours., Disp: , Rfl:    Oxcarbazepine  (TRILEPTAL ) 300 MG tablet, TAKE 2 TABLETS (600 MG TOTAL) BY MOUTH 2 (TWO) TIMES DAILY (AM+EVENING), Disp: 360 tablet, Rfl: 1   pantoprazole  (PROTONIX ) 40 MG tablet, Take 40 mg by mouth daily., Disp: , Rfl:    predniSONE  (DELTASONE ) 5 MG tablet, Take 5 mg by mouth daily with breakfast. Take 1 tablets once a day, Disp: , Rfl:    pregabalin  (LYRICA ) 150 MG capsule, Take 150 mg by mouth at bedtime., Disp: , Rfl:    propranolol (INDERAL) 10 MG tablet, Take 10 mg by mouth 2 (two) times daily., Disp: , Rfl:    rosuvastatin  (CRESTOR ) 10 MG tablet, Take 20 mg by mouth daily., Disp: , Rfl:    senna-docusate (SENOKOT-S) 8.6-50 MG tablet, Take 1-2 tablets by mouth 2 (two) times daily as needed for mild constipation., Disp: , Rfl:    sulfamethoxazole -trimethoprim  (BACTRIM ) 400-80 MG tablet, Take 1 tablet by mouth daily., Disp: , Rfl:    tacrolimus  (PROGRAF ) 1 MG capsule, Take 5-6 mg by mouth See admin instructions. Take 6 capsules in the morning and 5 capsules every evening, Disp: , Rfl:    valGANciclovir  (VALCYTE ) 450 MG tablet, Take by mouth daily. Take 2 tablets by mouth every evening for 180 days (Patient not  taking: Reported on 06/29/2023), Disp: , Rfl:      Patients Current Diet: Diet Regular, Thin    Precautions / Restrictions Precautions: Fall     Has the patient had 2 or more falls or a fall with injury in the past year? Yes   Prior Activity Level Community (5-7x/wk): Prior to May, Pt. active in the community     Prior Functional Level Self Care: Did the patient need help bathing, dressing, using the toilet or eating? Independent   Indoor Mobility: Did the patient need assistance with walking from room to room (with or without device)? Independent   Stairs: Did the patient need assistance with internal or external stairs (with or without device)? Independent   Functional Cognition: Did the patient need help planning regular tasks such as shopping or remembering to take medications? Independent   Patient Information Are you of Hispanic, Latino/a,or Spanish origin?: A. No, not of Hispanic, Latino/a, or Spanish origin What is your race?: B. Black or African American Do you need or want an interpreter to communicate with a doctor or health care staff?: 0. No   Patient's Response To:  Health Literacy and Transportation Is the patient able to respond to health literacy and transportation needs?: Yes Health Literacy - How often do you need to have someone help you when you read instructions, pamphlets, or other written material from your doctor or pharmacy?: Never In the past 12 months, has lack of transportation kept you from medical appointments or from getting medications?: No In the past 12 months, has lack of transportation kept you from meetings, work, or from getting things needed for  daily living?: No   Home Assistive Devices / Equipment   Prior Device Use: Indicate devices/aids used by the patient prior to current illness, exacerbation or injury? None of the above     Prior Functional Level Current Functional Level  Bed Mobility   Independent Mod assist    Transfers    Independent   Mod assist    Mobility - Walk/Wheelchair   Independent   Other    Upper Body Dressing   Independent   Mod assist    Lower Body Dressing   Independent   Max assist    Grooming   Independent   Mod assist    Eating/Drinking   Independent   Mod assist    Toilet Transfer   Independent   Max assist    Bladder Continence    continent   Continent    Bowel Management   Continent   COntinent    Stair Climbing   Independent   Other    Communication   independent   Min A    Memory   independent min A      Special considerations/life events Continuous Drip IV  heparin , Skin intact, Special service needs none, and Bowel management continent   Previous Home Environment (from acute therapy documentation) Living Arrangements: Children; Spouse/significant other; Parent  Lives With: Spouse Available Help at Discharge: Family Type of Home: House Home Layout: One level Home Access: Ramped entrance Bathroom Shower/Tub: Nurse, adult Accessibility: Yes How Accessible: Accessible via wheelchair; Accessible via walker     Discharge Living Setting Plans for Discharge Living Setting: House Type of Home at Discharge: House Discharge Home Layout: Two level; Able to live on main level with bedroom/bathroom Alternate Level Stairs-Rails: Right; Left Alternate Level Stairs-Number of Steps: flight Discharge Home Access: Ramped entrance Discharge Bathroom Shower/Tub: Tub/shower unit Discharge Bathroom Toilet: Standard Discharge Bathroom Accessibility: No Does the patient have any problems obtaining your medications?: No     Social/Family/Support Systems Patient Roles: Spouse Contact Information: [phone] Anticipated Caregiver: Jason Stevenson (wife) Anticipated Caregiver's Contact Information: Lives with many family members, wife works at home, someone always home and able to assist with transfers and personal care Ability/Limitations of Caregiver:  min-mod A Caregiver Availability: 24/7 Discharge Plan Discussed with Primary Caregiver: Yes Is Caregiver In Agreement with Plan?: Yes Does Caregiver/Family have Issues with Lodging/Transportation while Pt is in Rehab?: No     Goals Patient/Family Goal for Rehab: PT/OT Min A Expected length of stay: 18-21 days Pt/Family Agrees to Admission and willing to participate: Yes Program Orientation Provided & Reviewed with Pt/Caregiver Including Roles  & Responsibilities: Yes     Decrease burden of Care through IP rehab admission: Not anticipated   Possible need for SNF placement upon discharge: Not anticipated   Patient Condition: I have reviewed medical records from [facility] , spoken with CM, and patient and spouse. I met with patient at the bedside for inpatient rehabilitation assessment.  Patient will benefit from ongoing PT, OT, and SLP, can actively participate in 3 hours of therapy a day 5 days of the week, and can make measurable gains during the admission.  Patient will also benefit from the coordinated team approach during an Inpatient Acute Rehabilitation admission.  The patient will receive intensive therapy as well as Rehabilitation physician, nursing, social worker, and care management interventions.  Due to bladder management, bowel management, safety, skin/wound care, disease management, medication administration, pain management, and patient education the patient requires 24 hour a day  rehabilitation nursing.  The patient is currently mod-max A with mobility and basic ADLs.  Discharge setting and therapy post discharge at home with home health is anticipated.  Patient has agreed to participate in the Acute Inpatient Rehabilitation Program and will admit today.   Preadmission Screen Completed By:  Leita KATHEE Kleine, 11/19/2023 3:34 PM ______________________________________________________________________   Discussed status with Dr. Carilyn  on 11/24/23 at 954 and received  approval for admission today.   Admission Coordinator:  Leita KATHEE Kleine, CCC-SLP, time 955/Date 11/24/23   Assessment/Plan: Diagnosis: debility due to cardiac arrest Does the need for close, 24 hr/day Medical supervision in concert with the patient's rehab needs make it unreasonable for this patient to be served in a less intensive setting? Yes Co-Morbidities requiring supervision/potential complications: Hx ICH with hemiparesis, HTN, HLD, Pleural effusion Due to bladder management, bowel management, safety, skin/wound care, disease management, medication administration, pain management, and patient education, does the patient require 24 hr/day rehab nursing? Yes Does the patient require coordinated care of a physician, rehab nurse, PT, OT, and SLP to address physical and functional deficits in the context of the above medical diagnosis(es)? Yes Addressing deficits in the following areas: balance, endurance, locomotion, strength, transferring, bowel/bladder control, bathing, dressing, toileting, and psychosocial support Can the patient actively participate in an intensive therapy program of at least 3 hrs of therapy 5 days a week? Yes The potential for patient to make measurable gains while on inpatient rehab is good Anticipated functional outcomes upon discharge from inpatient rehab: min assist PT, min assist OT, supervision SLP Estimated rehab length of stay to reach the above functional goals is: 18-21d Anticipated discharge destination: home with 24/7 assist from wife 10. Overall Rehab/Functional Prognosis: good   MD Signature Prentice CHARLENA Carilyn M.D. [facility] Fellow Am Acad of Phys Med and Rehab Diplomate Am Board of Electrodiagnostic Med Fellow Am Board of Interventional Pain

## 2023-11-25 DIAGNOSIS — Z794 Long term (current) use of insulin: Secondary | ICD-10-CM

## 2023-11-25 DIAGNOSIS — E1140 Type 2 diabetes mellitus with diabetic neuropathy, unspecified: Secondary | ICD-10-CM

## 2023-11-25 DIAGNOSIS — T8623 Heart transplant infection: Secondary | ICD-10-CM | POA: Diagnosis not present

## 2023-11-25 DIAGNOSIS — R5381 Other malaise: Secondary | ICD-10-CM | POA: Diagnosis not present

## 2023-11-25 LAB — CBC WITH DIFFERENTIAL/PLATELET
Abs Immature Granulocytes: 0.01 K/uL (ref 0.00–0.07)
Basophils Absolute: 0.2 K/uL — ABNORMAL HIGH (ref 0.0–0.1)
Basophils Relative: 4 %
Eosinophils Absolute: 0.1 K/uL (ref 0.0–0.5)
Eosinophils Relative: 2 %
HCT: 29.5 % — ABNORMAL LOW (ref 39.0–52.0)
Hemoglobin: 9.7 g/dL — ABNORMAL LOW (ref 13.0–17.0)
Immature Granulocytes: 0 %
Lymphocytes Relative: 57 %
Lymphs Abs: 2.3 K/uL (ref 0.7–4.0)
MCH: 29 pg (ref 26.0–34.0)
MCHC: 32.9 g/dL (ref 30.0–36.0)
MCV: 88.1 fL (ref 80.0–100.0)
Monocytes Absolute: 0.3 K/uL (ref 0.1–1.0)
Monocytes Relative: 8 %
Neutro Abs: 1.2 K/uL — ABNORMAL LOW (ref 1.7–7.7)
Neutrophils Relative %: 29 %
Platelets: 301 K/uL (ref 150–400)
RBC: 3.35 MIL/uL — ABNORMAL LOW (ref 4.22–5.81)
RDW: 15.1 % (ref 11.5–15.5)
WBC: 4.1 K/uL (ref 4.0–10.5)
nRBC: 0 % (ref 0.0–0.2)

## 2023-11-25 LAB — COMPREHENSIVE METABOLIC PANEL WITH GFR
ALT: 18 U/L (ref 0–44)
AST: 20 U/L (ref 15–41)
Albumin: 2.6 g/dL — ABNORMAL LOW (ref 3.5–5.0)
Alkaline Phosphatase: 66 U/L (ref 38–126)
Anion gap: 9 (ref 5–15)
BUN: 16 mg/dL (ref 6–20)
CO2: 23 mmol/L (ref 22–32)
Calcium: 8.9 mg/dL (ref 8.9–10.3)
Chloride: 103 mmol/L (ref 98–111)
Creatinine, Ser: 1.06 mg/dL (ref 0.61–1.24)
GFR, Estimated: >60 mL/min (ref >60)
Glucose, Bld: 196 mg/dL — ABNORMAL HIGH (ref 70–99)
Potassium: 4.3 mmol/L (ref 3.5–5.1)
Sodium: 135 mmol/L (ref 135–145)
Total Bilirubin: 0.6 mg/dL (ref 0.0–1.2)
Total Protein: 6.5 g/dL (ref 6.5–8.1)

## 2023-11-25 LAB — HEMOGLOBIN A1C
Hgb A1c MFr Bld: 7.2 % — ABNORMAL HIGH (ref 4.8–5.6)
Mean Plasma Glucose: 160 mg/dL

## 2023-11-25 LAB — GLUCOSE, CAPILLARY
Glucose-Capillary: 124 mg/dL — ABNORMAL HIGH (ref 70–99)
Glucose-Capillary: 180 mg/dL — ABNORMAL HIGH (ref 70–99)
Glucose-Capillary: 190 mg/dL — ABNORMAL HIGH (ref 70–99)
Glucose-Capillary: 72 mg/dL (ref 70–99)
Glucose-Capillary: 82 mg/dL (ref 70–99)
Glucose-Capillary: 95 mg/dL (ref 70–99)

## 2023-11-25 LAB — MAGNESIUM: Magnesium: 1.5 mg/dL — ABNORMAL LOW (ref 1.7–2.4)

## 2023-11-25 LAB — PHOSPHORUS: Phosphorus: 4.5 mg/dL (ref 2.5–4.6)

## 2023-11-25 MED ORDER — MAGNESIUM SULFATE 2 GM/50ML IV SOLN
2.0000 g | Freq: Once | INTRAVENOUS | Status: AC
  Administered 2023-11-25 (×2): 2 g via INTRAVENOUS
  Filled 2023-11-25: qty 50

## 2023-11-25 NOTE — Progress Notes (Addendum)
 Advanced Heart Failure Rounding Note  Cardiologist: Maude Emmer, MD   Chief Complaint: S/p OHT, assistance with immunosuppression  Subjective:    Feeling well this morning. States he was able to stand with OT. Eager to participate in therapy. Wants to return home in time for his wedding anniversary.   Objective:   Weight Range: 82.3 kg Body mass index is 25.31 kg/m.   Vital Signs:   Temp:  [97.6 F (36.4 C)-98.4 F (36.9 C)] 97.9 F (36.6 C) (08/12 0515) Pulse Rate:  [93-100] 95 (08/12 0515) Resp:  [16-19] 19 (08/12 0515) BP: (126-143)/(79-100) 143/95 (08/12 0515) SpO2:  [99 %-100 %] 100 % (08/12 0826) Weight:  [82.3 kg-83.5 kg] 82.3 kg (08/12 0515) Last BM Date : 11/21/23  Weight change: Filed Weights   11/24/23 1419 11/25/23 0515  Weight: 83.5 kg 82.3 kg    Intake/Output:   Intake/Output Summary (Last 24 hours) at 11/25/2023 0913 Last data filed at 11/25/2023 0516 Gross per 24 hour  Intake --  Output 775 ml  Net -775 ml      Physical Exam    General:  No distress. Sitting in wheelchair. Cor: no JVD. Regular rate & rhythm. No murmurs. Lungs: Clear Abdomen: Soft, nontender, nondistended.  Extremities: No edema Neuro: Alert & orientedx3. Affect pleasant. L upper and lower extremity weakness.   Labs    CBC Recent Labs    11/25/23 0443  WBC 4.1  NEUTROABS 1.2*  HGB 9.7*  HCT 29.5*  MCV 88.1  PLT 301   Basic Metabolic Panel Recent Labs    91/87/74 0443  NA 135  K 4.3  CL 103  CO2 23  GLUCOSE 196*  BUN 16  CREATININE 1.06  CALCIUM  8.9  MG 1.5*  PHOS 4.5   Liver Function Tests Recent Labs    11/25/23 0443  AST 20  ALT 18  ALKPHOS 66  BILITOT 0.6  PROT 6.5  ALBUMIN 2.6*   No results for input(s): LIPASE, AMYLASE in the last 72 hours. Cardiac Enzymes No results for input(s): CKTOTAL, CKMB, CKMBINDEX, TROPONINI in the last 72 hours.  BNP: BNP (last 3 results) No results for input(s): BNP in the last 8760  hours.  ProBNP (last 3 results) No results for input(s): PROBNP in the last 8760 hours.   D-Dimer No results for input(s): DDIMER in the last 72 hours. Hemoglobin A1C Recent Labs    11/24/23 1537  HGBA1C 7.2*   Fasting Lipid Panel No results for input(s): CHOL, HDL, LDLCALC, TRIG, CHOLHDL, LDLDIRECT in the last 72 hours. Thyroid  Function Tests No results for input(s): TSH, T4TOTAL, T3FREE, THYROIDAB in the last 72 hours.  Invalid input(s): FREET3  Other results:   Imaging    DG Chest 2 View Result Date: 11/24/2023 CLINICAL DATA:  Follow-up EXAM: CHEST - 2 VIEW COMPARISON:  Chest x-ray 09/30/2023 FINDINGS: The heart is enlarged. The lungs are clear. There is no pleural effusion or pneumothorax. Sternotomy wires are present. No acute fractures are identified. IMPRESSION: Cardiomegaly. No acute cardiopulmonary process. Electronically Signed   By: Greig Pique M.D.   On: 11/24/2023 20:58     Medications:     Scheduled Medications:  atorvastatin   10 mg Oral Q supper   budesonide  (PULMICORT ) nebulizer solution  0.25 mg Nebulization BID   calcium  acetate  1,334 mg Oral TID WC   carvedilol   12.5 mg Oral BID WC   cycloSPORINE  modified  125 mg Oral BID   ezetimibe   10 mg Oral Daily  heparin  injection (subcutaneous)  5,000 Units Subcutaneous Q8H   insulin  aspart  0-5 Units Subcutaneous QHS   insulin  aspart  0-9 Units Subcutaneous TID WC   insulin  aspart  5 Units Subcutaneous TID WC   insulin  glargine-yfgn  26 Units Subcutaneous Q2200   levETIRAcetam   1,500 mg Oral BID   lidocaine   2 patch Transdermal Q24H   melatonin  3 mg Oral QHS   OXcarbazepine   300 mg Oral Daily   OXcarbazepine   600 mg Oral QHS   pantoprazole   40 mg Oral Daily   predniSONE   5 mg Oral Q breakfast   pregabalin   75 mg Oral QHS   Ensure Max Protein  11 oz Oral BID   protein supplement  1 Scoop Oral TID WC   sulfamethoxazole -trimethoprim   1 tablet Oral Daily   traZODone   50 mg  Oral QHS   valGANciclovir   900 mg Oral Daily    Infusions:  magnesium  sulfate bolus IVPB 2 g (11/25/23 0857)    PRN Medications: acetaminophen , alum & mag hydroxide-simeth, bisacodyl , diclofenac  Sodium, diphenhydrAMINE , guaiFENesin -dextromethorphan, ipratropium-albuterol , prochlorperazine  **OR** prochlorperazine  **OR** prochlorperazine , senna-docusate, sodium phosphate    Patient Profile   38 y.o. 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   Transferred to CIR 11/24/23 after prolonged hospitalization at [facility] w/ CMV viremia c/b PEA arrest, subsequent [facility] ECMO cannulation >>VV ECMO and eventual trach.  Assessment/Plan  HFrEF d/t NICM s/p OHT 8/24 - s/p OHT 11/26/22. Last EMBx in 3/25 negative with normal EF.  - TTE pre-arrest with EF 40% and severely reduced RV function - s/p PEA arrest (see below) - RHC 10/15/23 with normal CI and elevated PCWP. EMBx taken 7/2 - TTE 7/29 with normal biV function.  - euvolemic on exam - Dr. Kinslee Dalpe discussed immunosuppression with [facility], Dr. Devore. Continue cyclosporine  125 mg bid. CyA goal 150-200 (346 on 08/11). Repeat CyA trough level on 08/14.  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 - induced with ganciclovir - continue valganciclovir  900 mg daily (plan for 3 months)   PEA arrest on 10/01/23 - s/p VA ECLS transitioned to VV ECMO 6/21. De-cannulated 6/25. Trach placed.   - presumed respiratory etiology, possibly d/t unwitnessed seizure - ESBL Kleb HCAP s/p meropenem  - trach de-cannulated 8/4   H/o CVA - stroke with hemorrhagic conversion in 2022 and again in 4/25 - residual L sided deficits and  seizures - continue keppra  and trileptal       Length of Stay: 1  FINCH, LINDSAY N, PA-C  11/25/2023, 9:13 AM  Advanced Heart Failure Team Pager [phone] (M-F; 7a - 5p)  Please contact [facility] for night-coverage after hours (5p -7a ) and weekends on [email or web address]  Patient seen and examined with the above-signed Advanced Practice Provider and/or Housestaff. I personally reviewed laboratory data, imaging studies and relevant notes. I independently examined the patient and formulated the important aspects of the plan. I have edited the note to reflect any of my changes or salient points. I have personally discussed the plan with the patient and/or family.  Feels weak but otherwise ok. No CP or SOB.  Started rehab today  General:  Weak appearing. No resp difficulty HEENT: normal Neck: supple. no JVD. Healing trach site Cor: PMI nondisplaced. Regular rate & rhythm. No rubs, gallops or murmurs. Lungs: clear Abdomen: soft, nontender, nondistended. No hepatosplenomegaly. No bruits or masses. Good bowel sounds. Extremities: no cyanosis, clubbing, rash, edema Neuro: alert & orientedx3, cranial nerves grossly intact. Weak on left  He is stable from cardiac perspective. I spoke with the [facility] team personally today. Will continue oral treatment for CMV. Check trough CyA level on Thursday and adjust dose as needed.   Toribio Fuel, MD  6:10 PM

## 2023-11-25 NOTE — Plan of Care (Signed)
  Problem: RH Problem Solving Goal: LTG Patient will demonstrate problem solving for (SLP) Description: LTG:  Patient will demonstrate problem solving for basic/complex daily situations with cues  (SLP) Flowsheets (Taken 11/25/2023 1401) LTG: Patient will demonstrate problem solving for (SLP): (mildly complex) -- LTG Patient will demonstrate problem solving for: Supervision   Problem: RH Memory Goal: LTG Patient will use memory compensatory aids to (SLP) Description: LTG:  Patient will use memory compensatory aids to recall biographical/new, daily complex information with cues (SLP) Flowsheets (Taken 11/25/2023 1401) LTG: Patient will use memory compensatory aids to (SLP): Supervision   Problem: RH Attention Goal: LTG Patient will demonstrate this level of attention during functional activites (SLP) Description: LTG:  Patient will will demonstrate this level of attention during functional activites (SLP) Flowsheets (Taken 11/25/2023 1401) Patient will demonstrate during cognitive/linguistic activities the attention type of: Sustained LTG: Patient will demonstrate this level of attention during cognitive/linguistic activities with assistance of (SLP): Supervision

## 2023-11-25 NOTE — Plan of Care (Signed)
  Problem: RH Balance Goal: LTG Patient will maintain dynamic standing with ADLs (OT) Description: LTG:  Patient will maintain dynamic standing balance with assist during activities of daily living (OT)  Flowsheets (Taken 11/25/2023 1629) LTG: Pt will maintain dynamic standing balance during ADLs with: Minimal Assistance - Patient > 75%   Problem: Sit to Stand Goal: LTG:  Patient will perform sit to stand in prep for activites of daily living with assistance level (OT) Description: LTG:  Patient will perform sit to stand in prep for activites of daily living with assistance level (OT) Flowsheets (Taken 11/25/2023 1629) LTG: PT will perform sit to stand in prep for activites of daily living with assistance level: Minimal Assistance - Patient > 75%   Problem: RH Dressing Goal: LTG Patient will perform upper body dressing (OT) Description: LTG Patient will perform upper body dressing with assist, with/without cues (OT). Flowsheets (Taken 11/25/2023 1629) LTG: Pt will perform upper body dressing with assistance level of: Supervision/Verbal cueing Goal: LTG Patient will perform lower body dressing w/assist (OT) Description: LTG: Patient will perform lower body dressing with assist, with/without cues in positioning using equipment (OT) Flowsheets (Taken 11/25/2023 1629) LTG: Pt will perform lower body dressing with assistance level of: Minimal Assistance - Patient > 75%   Problem: RH Toileting Goal: LTG Patient will perform toileting task (3/3 steps) with assistance level (OT) Description: LTG: Patient will perform toileting task (3/3 steps) with assistance level (OT)  Flowsheets (Taken 11/25/2023 1629) LTG: Pt will perform toileting task (3/3 steps) with assistance level: Minimal Assistance - Patient > 75%   Problem: RH Functional Use of Upper Extremity Goal: LTG Patient will use RT/LT upper extremity as a (OT) Description: LTG: Patient will use right/left upper extremity as a stabilizer/gross  assist/diminished/nondominant/dominant level with assist, with/without cues during functional activity (OT) Flowsheets (Taken 11/25/2023 1629) LTG: Use of upper extremity in functional activities: LUE as a stabilizer   Problem: RH Toilet Transfers Goal: LTG Patient will perform toilet transfers w/assist (OT) Description: LTG: Patient will perform toilet transfers with assist, with/without cues using equipment (OT) Flowsheets (Taken 11/25/2023 1629) LTG: Pt will perform toilet transfers with assistance level of: Minimal Assistance - Patient > 75%   Problem: RH Tub/Shower Transfers Goal: LTG Patient will perform tub/shower transfers w/assist (OT) Description: LTG: Patient will perform tub/shower transfers with assist, with/without cues using equipment (OT) Flowsheets (Taken 11/25/2023 1629) LTG: Pt will perform tub/shower stall transfers with assistance level of: Minimal Assistance - Patient > 75%   Problem: RH Attention Goal: LTG Patient will demonstrate this level of attention during functional activites (OT) Description: LTG:  Patient will demonstrate this level of attention during functional activites  (OT) Flowsheets (Taken 11/25/2023 1629) Patient will demonstrate this level of attention during functional activites: Selective LTG: Patient will demonstrate this level of attention during functional activites (OT): Supervision

## 2023-11-25 NOTE — Progress Notes (Signed)
 Inpatient Rehabilitation  Patient information reviewed and entered into eRehab system by Burnard Mealing, OTR/L, Rehab Quality Coordinator.   Information including medical coding, functional ability and quality indicators will be reviewed and updated through discharge.

## 2023-11-25 NOTE — Progress Notes (Signed)
 PROGRESS NOTE   Subjective/Complaints:  Appreciate heart failure team consultation. Patient denies chest pains.  He slept well last night. Review of systems no shortness of breath no abdominal pain no constipation no urinary problems  Objective:   DG Chest 2 View Result Date: 11/24/2023 CLINICAL DATA:  Follow-up EXAM: CHEST - 2 VIEW COMPARISON:  Chest x-ray 09/30/2023 FINDINGS: The heart is enlarged. The lungs are clear. There is no pleural effusion or pneumothorax. Sternotomy wires are present. No acute fractures are identified. IMPRESSION: Cardiomegaly. No acute cardiopulmonary process. Electronically Signed   By: Greig Pique M.D.   On: 11/24/2023 20:58   Recent Labs    11/25/23 0443  WBC 4.1  HGB 9.7*  HCT 29.5*  PLT 301   Recent Labs    11/25/23 0443  NA 135  K 4.3  CL 103  CO2 23  GLUCOSE 196*  BUN 16  CREATININE 1.06  CALCIUM  8.9    Intake/Output Summary (Last 24 hours) at 11/25/2023 0900 Last data filed at 11/25/2023 0516 Gross per 24 hour  Intake --  Output 775 ml  Net -775 ml        Physical Exam: Vital Signs Blood pressure (!) 143/95, pulse 95, temperature 97.9 F (36.6 C), temperature source Oral, resp. rate 19, height 5' 11 (1.803 m), weight 82.3 kg, SpO2 100%.  HEENT trach site well healed , dressing removed  General: No acute distress Mood and affect are appropriate Heart: Regular rate and rhythm no rubs murmurs or extra sounds Lungs: Clear to auscultation, breathing unlabored, no rales or wheezes Abdomen: Positive bowel sounds, soft nontender to palpation, nondistended Extremities: No clubbing, cyanosis, or edema Skin: No evidence of breakdown, no evidence of rash Neurologic: Cranial nerves II through XII intact, motor strength is 4/5 in right deltoid, bicep, tricep, grip, hip flexor, knee extensors, ankle dorsiflexor and plantar flexor LUE  2 -/5 left deltoid, bicep, tricep, finger  flexors and trace extensors. Left lower extremity 3 - hip flexor 4 - knee extensor 0 ankle dorsiflexor and plantar flexor Sensory exam normal sensation to light touch is reduced on the left side below the knee as well as in the left upper extremity cerebellar exam normal finger to nose to finger as well as heel to shin in bilateral upper and lower extremities Musculoskeletal: Full range of motion in all 4 extremities. No joint swelling     Assessment/Plan: 1. Functional deficits which require 3+ hours per day of interdisciplinary therapy in a comprehensive inpatient rehab setting. Physiatrist is providing close team supervision and 24 hour management of active medical problems listed below. Physiatrist and rehab team continue to assess barriers to discharge/monitor patient progress toward functional and medical goals  Care Tool:  Bathing              Bathing assist       Upper Body Dressing/Undressing Upper body dressing        Upper body assist      Lower Body Dressing/Undressing Lower body dressing            Lower body assist       Toileting Toileting    Toileting assist  Transfers Chair/bed transfer  Transfers assist           Locomotion Ambulation   Ambulation assist              Walk 10 feet activity   Assist           Walk 50 feet activity   Assist           Walk 150 feet activity   Assist           Walk 10 feet on uneven surface  activity   Assist           Wheelchair     Assist               Wheelchair 50 feet with 2 turns activity    Assist            Wheelchair 150 feet activity     Assist          Blood pressure (!) 143/95, pulse 95, temperature 97.9 F (36.6 C), temperature source Oral, resp. rate 19, height 5' 11 (1.803 m), weight 82.3 kg, SpO2 100%.  Medical Problem List and Plan: 1. Functional deficits secondary to debility following PEA arrest              -patient may  shower             -ELOS/Goals: 18-21d MinA 2.  Antithrombotics: -DVT/anticoagulation:  Pharmaceutical: Lovenox              -antiplatelet therapy: N/A due to IPH 3. Pain Management: Tylenol  prn Been on Lyrica  at home for diabetic neuropathic pain. 4. Mood/Behavior/Sleep: LCSW to follow for evaluation and support.              -antipsychotic agents: N/A 5. Neuropsych/cognition: This patient is not capable of making decisions on his own behalf without input from [facility]. 6. Skin/Wound Care: Routine pressure relief measures. Monitor incision for healing.  7. Fluids/Electrolytes/Nutrition: Strict I/O. Renal diet with CM restrictions --no fluid restriction needed 8. PEA arrest X 2/CMV viremia: on Valcyte  900 mg daily thru 10/26 with monitoring of levels every 2 weeks post completion.  Pharmacy consult  9. Heart transplant: On Gengraf  BID with prednisone  daily             --continue Coreg , lower dose Lipitor  and Zetia .  Heart Failure team will follow  10. Elevated phosphorous: Will keep on liberalized diet for food choices and to help with intake             --continue Phoslo  1334 tid ac--recheck level in am. 11. Hx seizures: Has been stable on Keppra  and Trileptal .  12. 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  Monitor BS ac/hs. Continue Insulin  glargine with novolog  tid ac  CBG (last 3)  Recent Labs    11/24/23 2121 11/25/23 0627 11/25/23 1223  GLUCAP 199* 190* 95    13. Anemia of chronic disease: Monitor H/H for stability.           LOS: 1 days A FACE TO FACE EVALUATION WAS PERFORMED  Jason Stevenson 11/25/2023, 9:00 AM

## 2023-11-25 NOTE — Progress Notes (Signed)
 Inpatient Rehabilitation Care Coordinator Assessment and Plan Patient Details  Name: Jason Stevenson MRN: [id number] Date of Birth: 1986/04/09  Today's Date: 11/25/2023  Hospital Problems: Principal Problem:   Debility  Past Medical History:  Past Medical History:  Diagnosis Date   Chronic systolic CHF (congestive heart failure) (HCC) 06/18/2015   Heart transplant recipient [facility])    Hemorrhagic stroke (HCC)    Hyperlipidemia    Hypertension    Immune deficiency disorder (HCC)    Neuropathy    Nonischemic cardiomyopathy (HCC) Noted as early as 2007   Per chart review (cards consult note 2011), EF of 40% in 2007, down to 20-25% in 2011   Seizures (HCC)    Stroke (cerebrum) (HCC)    Type 2 diabetes mellitus (HCC)    Past Surgical History:  Past Surgical History:  Procedure Laterality Date   ICD IMPLANT N/A 08/27/2021   Procedure: ICD IMPLANT;  Surgeon: Cindie Ole DASEN, MD;  Location: [facility] INVASIVE CV LAB;  Service: Cardiovascular;  Laterality: N/A;   None     RIGHT HEART CATH  06/21/2021   [facility]   RIGHT HEART CATH N/A 05/17/2022   Procedure: RIGHT HEART CATH;  Surgeon: Rolan Ezra RAMAN, MD;  Location: [facility] INVASIVE CV LAB;  Service: Cardiovascular;  Laterality: N/A;   RIGHT/LEFT HEART CATH AND CORONARY ANGIOGRAPHY N/A 11/02/2020   Procedure: RIGHT/LEFT HEART CATH AND CORONARY ANGIOGRAPHY;  Surgeon: Rolan Ezra RAMAN, MD;  Location: [facility] INVASIVE CV LAB;  Service: Cardiovascular;  Laterality: N/A;   Social History:  reports that he has never smoked. He has never used smokeless tobacco. He reports that he does not currently use alcohol. He reports that he does not currently use drugs after having used the following drugs: Marijuana.  Family / Support Systems Marital Status: Married Patient Roles: Spouse, Other (Comment) (Uncle, son, retiree) Spouse/Significant Other: [name] [phone] Other Supports: Rachel-sister [phone] Anticipated Caregiver: Wife and other family  members Ability/Limitations of Caregiver: Wife works from home and is healthy and can physically assist Caregiver Availability: 24/7 Family Dynamics: Close with family members due to his apartment on thr second floor will be going home to sister's home where he can stay on the first level, but she does have four steps into home  Social History Preferred language: English Religion: Christian Cultural Background: NA Education: HS Health Literacy - How often do you need to have someone help you when you read instructions, pamphlets, or other written material from your doctor or pharmacy?: Never Writes: Yes Employment Status: Disabled Date Retired/Disabled/Unemployed: 2024 after heart transplant Legal History/Current Legal Issues: NA Guardian/Conservator: None-according to MD pt is not fully capable of making his own decisions while here. Will look toward his wife for any decisions while here.   Abuse/Neglect Abuse/Neglect Assessment Can Be Completed: Yes Physical Abuse: Denies Verbal Abuse: Denies Sexual Abuse: Denies Exploitation of patient/patient's resources: Denies Self-Neglect: Denies  Patient response to: Social Isolation - How often do you feel lonely or isolated from those around you?: Never  Emotional Status Pt's affect, behavior and adjustment status: Pt is motivated to improve and had just gotten out of rehab when he came back into the hospital for sepsis. He had his stroke in 08/2023 and got home 09/18/2023. He needed assist and family was providing this. He hopes to do well and not need as much care at discharge. Recent Psychosocial Issues: other health issues-heart transplant 2024 and CVA 08/2023 and now sepsis. Psychiatric History: No history but with all they he has been  through he would benefit from seeing neuro-psych while here Substance Abuse History: NA  Patient / Family Perceptions, Expectations & Goals Pt/Family understanding of illness & functional limitations: Pt  is able to explain all of his health issues and does speak with the MD's oinvllved and feels understands his plan moving forward. He wants to be kept updated on hhis issues and his family also does. He hopes he does well and can get home and stay home this time Premorbid pt/family roles/activities: husband, uncle, sibling, friend, etc Anticipated changes in roles/activities/participation: resume Pt/family expectations/goals: Pt states:  I hope to do well and be able to do for myself.  Community Resources [employer]: Other (Comment) (Foloowed by [facility] for his heart transplant) Premorbid Home Care/DME Agencies: Other (Comment) (has wc, rw, bsc) Transportation available at discharge: family provides this Is the patient able to respond to transportation needs?: Yes In the past 12 months, has lack of transportation kept you from medical appointments or from getting medications?: No In the past 12 months, has lack of transportation kept you from meetings, work, or from getting things needed for daily living?: No Resource referrals recommended: Neuropsychology  Discharge Planning Living Arrangements: Spouse/significant other, Other relatives Support Systems: Spouse/significant other, Other relatives, Friends/neighbors Type of Residence: Private residence Insurance Resources: Media planner (specify) ([employer] operational officer) Financial Resources: SSD, Family Support Financial Screen Referred: No Living Expenses: Lives with family Money Management: Spouse Does the patient have any problems obtaining your medications?: No Home Management: family Patient/Family Preliminary Plans: Return home with wife and sister in her home along with sister's family. He has been staying there since relaes for CIR 09/18/2023 and wife works from home and can assist him, along with sister and other family members. Someone is always there with him. He required assist prior to this admission due to CVA in  08/2023 Care Coordinator Barriers to Discharge: Insurance for SNF coverage Care Coordinator Anticipated Follow Up Needs: HH/OP  Clinical Impression Pleasant gentleman who has been through much since 2024 after heart transplant, CVA and now sepsis. He is motivated to do well and recover from this event. Aware being evaluated today and goals being set for stay here. Will see wife when here this afternoon and work on discharge needs. Have placed on neuro-psych list  [name] 11/25/2023, 11:07 AM

## 2023-11-25 NOTE — Progress Notes (Signed)
 Inpatient Rehabilitation Center Individual Statement of Services  Patient Name:  Jason Stevenson  Date:  11/25/2023  Welcome to the Inpatient Rehabilitation Center.  Our goal is to provide you with an individualized program based on your diagnosis and situation, designed to meet your specific needs.  With this comprehensive rehabilitation program, you will be expected to participate in at least 3 hours of rehabilitation therapies Monday-Friday, with modified therapy programming on the weekends.  Your rehabilitation program will include the following services:  Physical Therapy (PT), Occupational Therapy (OT), Speech Therapy (ST), 24 hour per day rehabilitation nursing, Therapeutic Recreaction (TR), Neuropsychology, Care Coordinator, Rehabilitation Medicine, Nutrition Services, and Pharmacy Services  Weekly team conferences will be held on Wednesday to discuss your progress.  Your Inpatient Rehabilitation Care Coordinator will talk with you frequently to get your input and to update you on team discussions.  Team conferences with you and your family in attendance may also be held.  Expected length of stay: 3 weeks  Overall anticipated outcome: supervision-min assist level  Depending on your progress and recovery, your program may change. Your Inpatient Rehabilitation Care Coordinator will coordinate services and will keep you informed of any changes. Your Inpatient Rehabilitation Care Coordinator's name and contact numbers are listed  below.  The following services may also be recommended but are not provided by the Inpatient Rehabilitation Center:   Home Health Rehabiltiation Services Outpatient Rehabilitation Services    Arrangements will be made to provide these services after discharge if needed.  Arrangements include referral to agencies that provide these services.  Your insurance has been verified to be:  [employer] Your primary doctor is:  Jason Stevenson  Pertinent information will be shared with your doctor and your insurance company.  Inpatient Rehabilitation Care Coordinator:  [name], KEN [phone] or [name]  Information discussed with and copy given to patient by: Jason Stevenson, 11/25/2023, 11:10 AM

## 2023-11-25 NOTE — Consult Note (Signed)
 WOC Nurse Consult Note: Reason for Consult: ECMO sites to B groin  Wound type: full thickness r/t use of ECMO at [facility] per notes  Pressure Injury POA: NA  Measurement: see nursing flowsheet  Wound bed:L groin appears dry with some tan fibrinous tissue; R groin tan fibrinous and red moist  Drainage (amount, consistency, odor) see nursing flowsheet  Periwound: intact  Dressing procedure/placement/frequency: Cleanse B groin ECMO sites with Vashe wound cleanser Soila [phone]) do not rinse and allow to air dry. Using a Q tip applicator insert a small piece of silver hydrofiber Soila [id number]) into wound bed daily and secure with silicone foam or dry gauze and tape whichever is preferred.   These sites appear to be healing appropriately.  POC discussed with bedside nurse. WOC team will not follow. Re-consult if further needs arise   Thank you,    Christipher Rieger MSN, RN-BC, [name]

## 2023-11-25 NOTE — Evaluation (Signed)
 Speech Language Pathology Assessment and Plan  Patient Details  Name: Jason Stevenson MRN: [id number] Date of Birth: 08-18-1985  SLP Diagnosis: Cognitive Impairments  Rehab Potential: Excellent ELOS: 18-21 days    Today's Date: 11/25/2023 SLP Individual Time: 1300-1400 SLP Individual Time Calculation (min): 60 min   Hospital Problem: Principal Problem:   Debility  Past Medical History:  Past Medical History:  Diagnosis Date   Chronic systolic CHF (congestive heart failure) (HCC) 06/18/2015   Heart transplant recipient [facility])    Hemorrhagic stroke (HCC)    Hyperlipidemia    Hypertension    Immune deficiency disorder (HCC)    Neuropathy    Nonischemic cardiomyopathy (HCC) Noted as early as 2007   Per chart review (cards consult note 2011), EF of 40% in 2007, down to 20-25% in 2011   Seizures (HCC)    Stroke (cerebrum) (HCC)    Type 2 diabetes mellitus (HCC)    Past Surgical History:  Past Surgical History:  Procedure Laterality Date   ICD IMPLANT N/A 08/27/2021   Procedure: ICD IMPLANT;  Surgeon: Cindie Ole DASEN, MD;  Location: [facility] INVASIVE CV LAB;  Service: Cardiovascular;  Laterality: N/A;   None     RIGHT HEART CATH  06/21/2021   [facility]   RIGHT HEART CATH N/A 05/17/2022   Procedure: RIGHT HEART CATH;  Surgeon: Rolan Ezra RAMAN, MD;  Location[facility] INVASIVE CV LAB;  Service: Cardiovascular;  Laterality: N/A;   RIGHT/LEFT HEART CATH AND CORONARY ANGIOGRAPHY N/A 11/02/2020   Procedure: RIGHT/LEFT HEART CATH AND CORONARY ANGIOGRAPHY;  Surgeon: Rolan Ezra RAMAN, MD;  Location: [facility] INVASIVE CV LAB;  Service: Cardiovascular;  Laterality: N/A;    Assessment / Plan / Recommendation Clinical Impression HPI:  Jason Stevenson is a 38 y.o. 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 The patient had recently discharged [facility] in [place] where he completed rehab after he suffered a hemorrhagic stroke in April. At [facility], the patient presented with tachycardia into the 130's, hypotension, Tmax 100.7 and satting 95% on 2L [place]. CBC notable for WBC 13.3, Hb 13.1, PLT 199. Chemistry with Na 136, K 5.0, Cl 103, BUN 38, sCR 2.2, glucose 233. Lactate 2.3, lipase 18, He was given initial doses of cefepime , vancomycin , and flagyl . He received 1 L of fluid in the ED, and approximately 300cc en route before losing IV access. On admission, vitals were T 37 C (98.6 F), HR (!) 122, BP 106/65, RR 20, and satting 93 % on 2L. Labs notable for Na 135, K grossly hemolyzed, HCO3 17*, BUN 38*, sCr 2.2*. CBC notable for WBC 13.5*, Hgb 12.1*, plts 202.  He was ultimately admitted 09/30/23 with concern for sepsis.  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 RHC on  10/15/23 with  elevated L sided pressures, He had a follow up TTE 7/29 with normal bi-V function. He arrested on the floor (5 rounds CPR with ROSC)on 6/17 and then again 10/01/23 where [facility] was [facility]. He was decannulated from [place] [facility] on 6/21; however was hypoxemic intra-op and [facility] Ecmo was  deployed. Left Chest tube was placed for pleural effusion on 10/04/23. Tracheostomy was performed and [facility] ECMO decannulated on 10/08/23. Right pigtail catheter placed for pleural effusion on 6/30. [name] was decannulated 11/17/22. Pt. Initially with coretrak for nutrition. He passed FEES on 10/30/23 and has been on a regular diet with thin liquids. Dobhoff tube removed  11/12/23. Chest tubes were removed 11/16/2022. Pt. Seen by PT/OT/SLP and they recommend CIR to assist return to  [facility].   Clinical Impression:  Bedside Swallow Evaluation: A bedside swallow evaluation was completed to assess for s/sx of oropharyngeal dysphagia. Oral mechanism exam revealed mild L facial and oral weakness. POs administered included thin liquids and solids. Patient with timely mastication and complete oral clearance. No s/sx of aspiration present. Recommend continuation of regular/thin diet per recent FEES with use of standardized precautions including sitting upright during PO and taking small bites/sips at a slow rate. No further dysphagia services warranted. Cognitive-Linguistic: Patient was evaluated via the Cognist[employer] standardized assessment. Patient scored WFL on all subtests including orientation, awareness, and receptive/expressive language. Informally, observed mild deficits in problem solving and memory as patient with difficulty recalling timeline of medical events and previous jobs. Patient is 90% intelligible at the conversational level however with low vocal intensity which may be due to debility vs baseline. Patient with an average sustained phonation time of 8 seconds.  Pt would benefit from skilled ST services to maximize communication and cognition in order to maximize functional independence at d/c. Anticipate patient will require supervision at d/c and potential f/u SLP services.    Skilled Therapeutic Interventions          Patient evaluated using a standardized cognitive linguistic assessment and bedside swallow evaluation to assess current cognitive, communicative and swallowing function. See above for details.    SLP Assessment  Patient will need skilled Speech Lanaguage Pathology Services during CIR admission    Recommendations  SLP Diet Recommendations: Age appropriate regular solids;Thin Liquid Administration via: Cup;Straw Medication Administration: Whole meds with liquid Supervision: Patient able to self feed Compensations: Slow rate;Small sips/bites Postural Changes  and/or Swallow Maneuvers: Seated upright 90 degrees Oral Care Recommendations: Oral care BID Patient destination: Home Follow up Recommendations:  (TBD) Equipment Recommended: None recommended by SLP    SLP Frequency 1 to 3 out of 7 days   SLP Duration  SLP Intensity  SLP Treatment/Interventions 18-21 days  Minumum of 1-2 x/day, 30 to 90 minutes  Cognitive remediation/compensation;Cueing hierarchy;Functional tasks;Internal/external aids;Multimodal communication approach;Speech/Language facilitation;Therapeutic Activities    Pain None reported   SLP Evaluation Cognition Overall Cognitive Status: Impaired/Different from baseline Arousal/Alertness: Awake/alert Orientation Level: Oriented X4 Year: 2025 Month: August Day of Week: Correct Attention: Sustained Sustained Attention: Impaired Sustained Attention Impairment: Functional basic Memory: Impaired Memory Impairment: Retrieval deficit;Storage deficit;Decreased short term memory Awareness: Appears intact Awareness Impairment: Emergent impairment Problem Solving: Impaired Problem Solving Impairment: Verbal complex;Functional complex Executive Function:  (some mild impairment d/t lower level deficit) Safety/Judgment: Impaired  Comprehension Auditory Comprehension Overall Auditory Comprehension: Appears within functional limits for tasks assessed Expression Expression Primary Mode of Expression: Verbal Verbal Expression Overall Verbal Expression: Appears within functional limits for tasks assessed Written Expression Dominant Hand: Right Oral Motor Oral Motor/Sensory Function Overall Oral Motor/Sensory Function: Mild impairment Facial ROM: Reduced left Facial Symmetry: Abnormal symmetry left Facial Strength: Within Functional Limits Facial Sensation: Reduced left Lingual ROM: Reduced left Lingual Symmetry: Abnormal symmetry left Velum: Within Functional Limits Mandible: Within Functional Limits Motor  Speech Overall Motor Speech: Impaired (vs baseline) Respiration:  Impaired Level of Impairment: Phrase Phonation: Low vocal intensity Resonance: Within functional limits Articulation: Within functional limitis Intelligibility: Intelligible Motor Planning: Within functional limits  Care Tool Care Tool Cognition Ability to hear (with hearing aid or hearing appliances if normally used Ability to hear (with hearing aid or hearing appliances if normally used): 0. Adequate - no difficulty in normal conservation, social interaction, listening to TV   Expression of Ideas and Wants Expression of Ideas and Wants: 3. Some difficulty - exhibits some difficulty with expressing needs and ideas (e.g, some words or finishing thoughts) or speech is not clear   Understanding Verbal and Non-Verbal Content Understanding Verbal and Non-Verbal Content: 3. Usually understands - understands most conversations, but misses some part/intent of message. Requires cues at times to understand  Memory/Recall Ability Memory/Recall Ability : Current season;That he or she is in a hospital/hospital unit    Bedside Swallowing Assessment General Previous Swallow Assessment: 7/28 Diet Prior to this Study: Regular;Thin liquids (Level 0) Respiratory Status: Room air Behavior/Cognition: Alert;Cooperative Oral Cavity - Dentition: Adequate natural dentition Self-Feeding Abilities: Able to feed self Patient Positioning: Upright in bed Baseline Vocal Quality: Low vocal intensity Volitional Cough: Weak Volitional Swallow: Able to elicit  Ice Chips Ice chips: Not tested Thin Liquid Thin Liquid: Within functional limits Presentation: Self Fed;Straw Nectar Thick Nectar Thick Liquid: Not tested Honey Thick Honey Thick Liquid: Not tested Puree Puree: Not tested Solid Solid: Within functional limits Presentation: Self Fed BSE Assessment Risk for Aspiration Impact on safety and function: Mild aspiration risk Other Related  Risk Factors: Previous CVA;Deconditioning  Short Term Goals: Week 1: SLP Short Term Goal 1 (Week 1): Patient will increase vocal intensity during conversation to reach 100% intelligibility given min verbal A SLP Short Term Goal 2 (Week 1): Patient will recall and utilize memory strategies given min multimodal A SLP Short Term Goal 3 (Week 1): Patient will demonstrate sustained attention to functional tasks given min multimodal A SLP Short Term Goal 4 (Week 1): Patient will demonstrate mildly complex problem solving skills given min multimodal A  Refer to Care Plan for Long Term Goals  Recommendations for other services: None   Discharge Criteria: Patient will be discharged from SLP if patient refuses treatment 3 consecutive times without medical reason, if treatment goals not met, if there is a change in medical status, if patient makes no progress towards goals or if patient is discharged from hospital.  The above assessment, treatment plan, treatment alternatives and goals were discussed and mutually agreed upon: by patient  Kaylany Tesoriero M.A., CCC-SLP 11/25/2023, 2:02 PM

## 2023-11-25 NOTE — Evaluation (Signed)
 Occupational Therapy Assessment and Plan  Patient Details  Name: Jason Stevenson MRN: [id number] Date of Birth: May 11, 1985  OT Diagnosis: abnormal posture, cognitive deficits, hemiplegia affecting non-dominant side, and muscle weakness (generalized) Rehab Potential: Rehab Potential (ACUTE ONLY): Good ELOS: 3 weeks   Today's Date: 11/25/2023 OT Individual Time: 9097-8984 OT Individual Time Calculation (min): 73 min     Hospital Problem: Principal Problem:   Debility   Past Medical History:  Past Medical History:  Diagnosis Date   Chronic systolic CHF (congestive heart failure) (HCC) 06/18/2015   Heart transplant recipient [facility])    Hemorrhagic stroke (HCC)    Hyperlipidemia    Hypertension    Immune deficiency disorder (HCC)    Neuropathy    Nonischemic cardiomyopathy (HCC) Noted as early as 2007   Per chart review (cards consult note 2011), EF of 40% in 2007, down to 20-25% in 2011   Seizures (HCC)    Stroke (cerebrum) (HCC)    Type 2 diabetes mellitus (HCC)    Past Surgical History:  Past Surgical History:  Procedure Laterality Date   ICD IMPLANT N/A 08/27/2021   Procedure: ICD IMPLANT;  Surgeon: Cindie Ole DASEN, MD;  Location: [facility] INVASIVE CV LAB;  Service: Cardiovascular;  Laterality: N/A;   None     RIGHT HEART CATH  06/21/2021   [facility]   RIGHT HEART CATH N/A 05/17/2022   Procedure: RIGHT HEART CATH;  Surgeon: Rolan Ezra RAMAN, MD;  Location: [facility] INVASIVE CV LAB;  Service: Cardiovascular;  Laterality: N/A;   RIGHT/LEFT HEART CATH AND CORONARY ANGIOGRAPHY N/A 11/02/2020   Procedure: RIGHT/LEFT HEART CATH AND CORONARY ANGIOGRAPHY;  Surgeon: Rolan Ezra RAMAN, MD;  Location: [facility] INVASIVE CV LAB;  Service: Cardiovascular;  Laterality: N/A;    Assessment & Plan Clinical Impression: Jason Stevenson is a 38 year old male with history of HTN, T2DM, 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   He had an 8-day length of stay according to the patient.  He was discharged to home ambulatory.  Patient could not provide any further info regarding his functional status between his discharge from [facility] and his stroke sustained in [place].  Thinks he was probably at home but did not know for sure.  He had a short 3-day hospitalization for weakness in March 2025 graft rejection and infection without.  The patient went home again, was visiting [place] 07/25/2023 when he developed a right basal ganglia hemorrhage and admitted to the hospital admission.  He was transferred to inpatient rehab and he was discharged to home 09/18/23.  Because of weakness the patient was taken to [facility] ED on 09/30/23 with hypotension, diarrhea, weakness, lethargy and felt to be septic therefore transferred to [facility] for management. Hospital course significant for PEA arrest  with 5 rounds of CPR w/ROSC, repeat arrest 06/18 requiring ECMO thorough 06/21.  He was started on broad spectrum antibiotics. 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 Hospital course significant for respiratory arrest question due to seizures requiring intubation and eventually tracheostomy, ESBL Kleb HCAP, CMV viremia, bilateral pleural effusions s/p pigtail catheter drainage, renal failure requiring HD, required multiple units PRBCs, superficial thrombus right basilic vein.   The patient had a tracheostomy while at [facility] and was decannulated on 11/17/2022.  The patient had Dobbhoff feeding tube while at [facility] and after passing his F EES on 717 his tube was removed  on 11/12/2023.  Chest tubes were removed on 11/16/2023.   In terms of CMV infection, Treated with induction dose ganciclovir and transitioned to maintenance dose on 07/26 w/last CMV PCR <260 and planss for 3 month course (~02/08/24) with every 2 week CMV PCR upon cessation X 3 months.Diet has been advanced to regular textures w/thin liquids. Renal status improved  and he continues to renal diet due to elevated phosphorous with binders TID.  Cardiology and endocrinology has been following to assist with medical issues. Patient transferred to CIR on 11/24/2023 .    Patient currently requires max with basic self-care skills and IADL secondary to muscle weakness and muscle joint tightness, decreased cardiorespiratoy endurance, impaired timing and sequencing, abnormal tone, unbalanced muscle activation, decreased coordination, and decreased motor planning, decreased visual perceptual skills and decreased visual motor skills, decreased attention to left and decreased motor planning, decreased initiation, decreased attention, decreased awareness, decreased problem solving, decreased safety awareness, decreased memory, and delayed processing, central origin, and decreased sitting balance, decreased standing balance, decreased postural control, hemiplegia, and decreased balance strategies.  Prior to hospitalization, patient could complete BADLs with SUP-MINA A.  Patient will benefit from skilled intervention to decrease level of assist with basic self-care skills and increase independence with basic self-care skills prior to discharge home with care partner.  Anticipate patient will require 24 hour supervision and minimal physical assistance and follow up home health.  OT - End of Session Activity Tolerance: Decreased this session Endurance Deficit: Yes OT Assessment Rehab Potential (ACUTE ONLY): Good OT Barriers to Discharge: None OT Patient demonstrates impairments in the following area(s): Balance;Perception;Safety;Cognition;Sensory;Skin Integrity;Endurance;Motor OT Basic ADL's Functional Problem(s): Eating;Grooming;Bathing;Dressing;Toileting OT Transfers Functional Problem(s): Toilet;Tub/Shower OT Additional Impairment(s): Fuctional Use of Upper Extremity OT Plan OT Intensity: Minimum of 1-2 x/day, 45 to 90 minutes OT Frequency: 5 out of 7 days OT  Duration/Estimated Length of Stay: 3 weeks OT Treatment/Interventions: Balance/vestibular training;Discharge planning;Functional electrical stimulation;Pain management;Self Care/advanced ADL retraining;Therapeutic Activities;UE/LE Coordination activities;Cognitive remediation/compensation;Disease mangement/prevention;Functional mobility training;Patient/family education;Skin care/wound managment;Therapeutic Exercise;Visual/perceptual remediation/compensation;Community reintegration;DME/adaptive equipment instruction;Neuromuscular re-education;Psychosocial support;Splinting/orthotics;UE/LE Strength taining/ROM;Wheelchair propulsion/positioning OT Self Feeding Anticipated Outcome(s): Set-up A OT Basic Self-Care Anticipated Outcome(s): Min A OT Toileting Anticipated Outcome(s): MIN A OT Bathroom Transfers Anticipated Outcome(s): MIN A OT Recommendation Recommendations for Other Services: Therapeutic Recreation consult Therapeutic Recreation Interventions: Pet therapy Patient destination: Home Follow Up Recommendations: Home health OT Equipment Recommended: To be determined   OT Evaluation Precautions/Restrictions  Precautions Precautions: Fall Precaution/Restrictions Comments: L Hemiplegia Restrictions Weight Bearing Restrictions Per Provider Order: No General Chart Reviewed: Yes Additional Pertinent History: HTN, T2DM, 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 and seizures, NICM s/p heart transplant 11/26/2022 Family/Caregiver Present: No Vital Signs   Pain Pain Assessment Pain Scale: 0-10 Pain Score: 0-No pain Home Living/Prior Functioning Home Living Family/patient expects to be discharged to:: Private residence Living Arrangements: Spouse/significant other, Other relatives Available Help at Discharge: Family Type of Home: House Home Access: Ramped entrance Home Layout: One level Bathroom Shower/Tub: Nurse, adult Accessibility: Yes   Lives With: Spouse IADL History Homemaking Responsibilities: Yes Meal Prep Responsibility: Secondary Laundry Responsibility: Secondary Cleaning Responsibility: Secondary Bill Paying/Finance Responsibility: Secondary Shopping Responsibility: Secondary Child Care Responsibility: No Homemaking Comments: Pt's wife and sister can assist with IADLs Current License: Yes Mode of Transportation: Car Education: some college Occupation: Unemployed Prior Function Level of Independence: Needs assistance with ADLs, Requires assistive device for independence (Pt using wc/RW for mobility and transfers following CVA and requiring assistance for ADLs)  Driving: No (has not been driving since having CVA) Vocation: Unemployed Vision Baseline Vision/History: 1 Wears glasses Ability to See in Adequate Light: 0 Adequate Patient Visual Report: No change from baseline Vision Assessment?: Yes Eye Alignment: Within Functional Limits Ocular Range of Motion: Within Functional Limits Alignment/Gaze Preference: Gaze right Tracking/Visual Pursuits: Able to track stimulus in all quads without difficulty Saccades: Decreased speed of saccadic movement Convergence: Impaired (comment) (no double vision reported) Visual Fields: No apparent deficits Perception  Perception: Impaired Perception-Other Comments: L inattention wiht R gaze preference Praxis Praxis: Impaired Praxis Impairment Details: Motor planning;Initiation Cognition Cognition Overall Cognitive Status: Impaired/Different from baseline Arousal/Alertness: Awake/alert Orientation Level: Person;Place;Situation Person: Oriented Place: Oriented Situation: Oriented Memory: Impaired Memory Impairment: Retrieval deficit;Storage deficit Attention: Sustained Sustained Attention: Impaired Sustained Attention Impairment: Functional basic Awareness: Impaired Awareness Impairment: Emergent impairment Problem Solving: Impaired Problem Solving Impairment:  Functional basic Executive Function:  (some mild impairment d/t lower level deficit) Safety/Judgment: Impaired Brief Interview for Mental Status (BIMS) Repetition of Three Words (First Attempt): 3 Temporal Orientation: Year: Correct Temporal Orientation: Month: Accurate within 5 days Temporal Orientation: Day: Correct Recall: Sock: Yes, no cue required Recall: Blue: Yes, no cue required Recall: Bed: Yes, after cueing (a piece of furniture) BIMS Summary Score: 14 Sensation Sensation Light Touch: Impaired by gross assessment Proprioception: Impaired Detail Proprioception Impaired Details: Impaired LUE;Impaired LLE Stereognosis: Impaired Detail Stereognosis Impaired Details: Impaired LUE;Impaired LLE Coordination Gross Motor Movements are Fluid and Coordinated: No Fine Motor Movements are Fluid and Coordinated: No Coordination and Movement Description: coordination deficit d/t L hemiplegia Finger Nose Finger Test: smooth equal movements on R, unable to complete on L Motor  Motor Motor: Hemiplegia Motor - Skilled Clinical Observations: L hemiplegia  Trunk/Postural Assessment  Cervical Assessment Cervical Assessment: Exceptions to [facility] (mild forward head) Thoracic Assessment Thoracic Assessment: Exceptions to [facility] (rounded shoudlers) Lumbar Assessment Lumbar Assessment: Within Functional Limits Postural Control Postural Control: Deficits on evaluation Trunk Control: decreased Righting Reactions: decreased  Balance Balance Balance Assessed: Yes Static Sitting Balance Static Sitting - Balance Support: Feet unsupported Static Sitting - Level of Assistance: 5: Stand by assistance Dynamic Sitting Balance Dynamic Sitting - Balance Support: Feet unsupported;Feet supported Dynamic Sitting - Level of Assistance: 4: Min assist;3: Mod assist Static Standing Balance Static Standing - Balance Support: During functional activity;Left upper extremity supported Static Standing -  Level of Assistance: 3: Mod assist Dynamic Standing Balance Dynamic Standing - Balance Support: During functional activity;Left upper extremity supported Dynamic Standing - Level of Assistance: 2: Max assist Extremity/Trunk Assessment RUE Assessment RUE Assessment: Within Functional Limits General Strength Comments: Mild strength deficit wiht tremor d/t general deconditioning LUE Assessment LUE Assessment: Exceptions to [facility] General Strength Comments: trace activation distally 1/5 grasp, 1/5 wrist flex/extension, 2-/5 bicep/tricep, 2/5 shoulder shrug LUE Tone LUE Tone: Mild (mild in L hand/wrist and elbow)  Care Tool Care Tool Self Care Eating   Eating Assist Level: Set up assist    Oral Care    Oral Care Assist Level: Minimal Assistance - Patient > 75%    Bathing   Body parts bathed by patient: Left arm;Face;Abdomen;Chest;Right upper leg;Left upper leg Body parts bathed by helper: Buttocks;Front perineal area;Left lower leg;Left arm;Right arm;Right lower leg   Assist Level: Maximal Assistance - Patient 24 - 49%    Upper Body Dressing(including orthotics)   What is the patient wearing?: Pull over shirt   Assist Level: Maximal Assistance - Patient 25 - 49%    Lower Body Dressing (excluding footwear)  What is the patient wearing?: Pants;Underwear/pull up Assist for lower body dressing: Maximal Assistance - Patient 25 - 49%    Putting on/Taking off footwear   What is the patient wearing?: Socks;Shoes Assist for footwear: Dependent - Patient 0%       Care Tool Toileting Toileting activity   Assist for toileting: Total Assistance - Patient < 25%     Care Tool Bed Mobility Roll left and right activity   Roll left and right assist level: Moderate Assistance - Patient 50 - 74%    Sit to lying activity   Sit to lying assist level: Maximal Assistance - Patient 25 - 49%    Lying to sitting on side of bed activity   Lying to sitting on side of bed assist level: the ability  to move from lying on the back to sitting on the side of the bed with no back support.: Maximal Assistance - Patient 25 - 49%     Care Tool Transfers Sit to stand transfer   Sit to stand assist level: Maximal Assistance - Patient 25 - 49%    Chair/bed transfer         Toilet transfer   Assist Level: Maximal Assistance - Patient 24 - 49% (heavy MOD-MAX stand pivot no AD)     Care Tool Cognition  Expression of Ideas and Wants Expression of Ideas and Wants: 3. Some difficulty - exhibits some difficulty with expressing needs and ideas (e.g, some words or finishing thoughts) or speech is not clear  Understanding Verbal and Non-Verbal Content Understanding Verbal and Non-Verbal Content: 3. Usually understands - understands most conversations, but misses some part/intent of message. Requires cues at times to understand   Memory/Recall Ability Memory/Recall Ability : Current season;That he or she is in a hospital/hospital unit   Refer to Care Plan for Long Term Goals  SHORT TERM GOAL WEEK 1 OT Short Term Goal 1 (Week 1): Pt wil recall hemi-dressing techniques with MIN questioning cues OT Short Term Goal 2 (Week 1): Pt will maintain L UE in safety position during ADL and functional transfers with MIN A OT Short Term Goal 3 (Week 1): Pt will complete LB dressing with MOD A OT Short Term Goal 4 (Week 1): Pt will complete 1/3 steps of toileting with MIN A  Recommendations for other services: Therapeutic Recreation  Pet therapy   Skilled Therapeutic Intervention Skilled Therapeutic Interventions/Progress Updates:  1:1 OT evaluation and intervention initiated with skilled education provided on OT role, goals, and POC. Pt received semi-reclined in bed presenting to be in good spirits receptive to skilled OT session reporting 0/10 pain- OT offering intermittent rest breaks, repositioning, and therapeutic support to optimize participation in therapy session. Pt completed ADLs at levels listed below  this session. Bathing completed EOB vs shower level d/t Pt with IV running during session. He complete sit<> stands from EOB with MOD/MAX A and stand pivot transfers with heavy MOD A. Pt verbalizing understanding of hospital safety policies following education. Pt was left resting in wc with call bell in reach, seatbelt alarm on, and all needs met.   ADL ADL Equipment Provided: Reacher;Leg lifter Eating: Set up Where Assessed-Eating: Chair Grooming: Moderate assistance Where Assessed-Grooming: Sitting at sink Upper Body Bathing: Moderate assistance Where Assessed-Upper Body Bathing: Edge of bed Lower Body Bathing: Maximal assistance Where Assessed-Lower Body Bathing: Edge of bed Upper Body Dressing: Maximal assistance Where Assessed-Upper Body Dressing: Edge of bed Lower Body Dressing: Maximal assistance Where Assessed-Lower Body Dressing: [place]  of bed Toileting: Maximal assistance Where Assessed-Toileting: Toilet;Bedside Commode Toilet Transfer: Maximal assistance Toilet Transfer Method: Stand pivot Toilet Transfer Equipment: Grab bars;Raised toilet seat Tub/Shower Transfer: Not assessed Film/video editor: Not assessed Mobility  Bed Mobility Bed Mobility: Rolling Right;Rolling Left;Supine to Sit Rolling Right: Maximal Assistance - Patient 25-49%;Moderate Assistance - Patient 50-74% Rolling Left: Moderate Assistance - Patient 50-74% Supine to Sit: Maximal Assistance - Patient - Patient 25-49% Transfers Sit to Stand: Maximal Assistance - Patient 25-49%;Moderate Assistance - Patient 50-74% Stand to Sit: Maximal Assistance - Patient 25-49%;Moderate Assistance - Patient 50-74%   Discharge Criteria: Patient will be discharged from OT if patient refuses treatment 3 consecutive times without medical reason, if treatment goals not met, if there is a change in medical status, if patient makes no progress towards goals or if patient is discharged from hospital.  The above assessment,  treatment plan, treatment alternatives and goals were discussed and mutually agreed upon: by patient  [name] 11/25/2023, 1:04 PM

## 2023-11-26 DIAGNOSIS — E1140 Type 2 diabetes mellitus with diabetic neuropathy, unspecified: Secondary | ICD-10-CM | POA: Diagnosis not present

## 2023-11-26 DIAGNOSIS — T8623 Heart transplant infection: Secondary | ICD-10-CM | POA: Diagnosis not present

## 2023-11-26 DIAGNOSIS — Z794 Long term (current) use of insulin: Secondary | ICD-10-CM | POA: Diagnosis not present

## 2023-11-26 DIAGNOSIS — R5381 Other malaise: Secondary | ICD-10-CM | POA: Diagnosis not present

## 2023-11-26 LAB — BASIC METABOLIC PANEL WITH GFR
Anion gap: 9 (ref 5–15)
BUN: 17 mg/dL (ref 6–20)
CO2: 22 mmol/L (ref 22–32)
Calcium: 8.9 mg/dL (ref 8.9–10.3)
Chloride: 102 mmol/L (ref 98–111)
Creatinine, Ser: 1.01 mg/dL (ref 0.61–1.24)
GFR, Estimated: >60 mL/min (ref >60)
Glucose, Bld: 212 mg/dL — ABNORMAL HIGH (ref 70–99)
Potassium: 4.5 mmol/L (ref 3.5–5.1)
Sodium: 133 mmol/L — ABNORMAL LOW (ref 135–145)

## 2023-11-26 LAB — PHOSPHORUS: Phosphorus: 4.3 mg/dL (ref 2.5–4.6)

## 2023-11-26 LAB — GLUCOSE, CAPILLARY
Glucose-Capillary: 190 mg/dL — ABNORMAL HIGH (ref 70–99)
Glucose-Capillary: 175 mg/dL — ABNORMAL HIGH (ref 70–99)
Glucose-Capillary: 139 mg/dL — ABNORMAL HIGH (ref 70–99)
Glucose-Capillary: 184 mg/dL — ABNORMAL HIGH (ref 70–99)

## 2023-11-26 LAB — MAGNESIUM: Magnesium: 1.7 mg/dL (ref 1.7–2.4)

## 2023-11-26 MED ORDER — MAGNESIUM SULFATE 2 GM/50ML IV SOLN
2.0000 g | Freq: Once | INTRAVENOUS | Status: AC
  Administered 2023-11-26 (×2): 2 g via INTRAVENOUS
  Filled 2023-11-26 (×2): qty 50

## 2023-11-26 NOTE — Plan of Care (Signed)
  Problem: RH Expression Communication Goal: LTG Patient will increase speech intelligibility (SLP) Description: LTG: Patient will increase speech intelligibility at word/phrase/conversation level with cues, % of the time (SLP) Flowsheets (Taken 11/26/2023 1033) LTG: Patient will increase speech intelligibility (SLP): Supervision Level: Conversation level Percent of time patient will use intelligible speech: 100

## 2023-11-26 NOTE — Progress Notes (Signed)
 Speech Language Pathology Daily Session Note  Patient Details  Name: Jason Stevenson MRN: [id number] Date of Birth: 04-05-1986  Today's Date: 11/26/2023 SLP Individual Time: 0800-0900 SLP Individual Time Calculation (min): 60 min  Short Term Goals: Week 1: SLP Short Term Goal 1 (Week 1): Patient will increase vocal intensity during conversation to reach 100% intelligibility given min verbal A SLP Short Term Goal 2 (Week 1): Patient will recall and utilize memory strategies given min multimodal A SLP Short Term Goal 3 (Week 1): Patient will demonstrate sustained attention to functional tasks given min multimodal A SLP Short Term Goal 4 (Week 1): Patient will demonstrate mildly complex problem solving skills given min multimodal A  Skilled Therapeutic Interventions: Skilled therapy session focused on cognitive and communicative goals. SLP facilitated session by assessing maximum expiratory pressure. Patient with an average MEP of 34 cm H2O (WFL 84-141 for his age). SLP initiated expiratory muscle strength training at 30 cm H2O to improve respiratory support for speech. SLP targeted cognitive goals through functional ADL tasks. Patient continent of bladder in urinal with minimal spillage on clothing. Patient requested assistance in changing pants and some bedding. Patient followed all single step directions to complete tasks. Patient independently recalled all care team names including therapist, nurse and doctor. Due to time constraints, further cognitive therapy will be initiated in upcoming sessions. Patient left in bed with alarm set and call bell in reach. Continue POC.  Pain None reported to SLP  Therapy/Group: Individual Therapy  Kamauri Denardo M.A., CCC-SLP 11/26/2023, 7:34 AM

## 2023-11-26 NOTE — Patient Care Conference (Signed)
 Inpatient RehabilitationTeam Conference and Plan of Care Update Date: 11/26/2023   Time: 10:29 AM    Patient Name: Jason Stevenson      Medical Record Number: [id number]  Date of Birth: 07-08-1985 Sex: Male         Room/Bed: 4W23C/4W23C-01 Payor Info: Payor: [place] MEDICA[employer] PREPAID HEALTH PLAN / Plan: [place] [employer] [place] / Product Type: *No Product type* /    Admit Date/Time:  11/24/2023  2:06 PM  Primary Diagnosis:  Debility  Hospital Problems: Principal Problem:   Debility    Expected Discharge Date: Expected Discharge Date: 12/16/23  Team Members Present: Physician leading conference: Dr. Prentice Compton Social Worker Present: Rhoda Clement, LCSW Nurse Present: Barnie Ronde, RN OT Present: Katheryn Mines, OT SLP Present: Blaise Alderman, SLP PPS Coordinator present : Eleanor Colon, SLP     Current Status/Progress Goal Weekly Team Focus  Bowel/Bladder   continent of bladder and bowel   maintian continence and promote regular bowel pattern   provide toileting q 3-4 hours and prn    Swallow/Nutrition/ Hydration               ADL's   MAX A LB ADLs, MOD_MAX UB ADLs, stand pivot without RW heavy MOD // Barriers: slowed processing, slow task initation,   MIN A- will continue to assess as Pt continues to progress, hopefully can upgrade to CGA   Evaluation, Pt education, L NMR, functional transfer training, ADL retraining    Mobility   Bed mobility = MaxA, Transfers = MaxA, Ambulation  = MaxA for 15' using hallway HR   overall MinA for mobility, CGA for transfers  Barriers: multiple recent comorbidities, decreased motor planning/ control /// Work On: NMR L hemibody, seated/ standing balance, gait training, motor control, strengthening, pt/ family education    Communication   low vocal intensity   supervision A   EMST    Safety/Cognition/ Behavioral Observations  minA   supervisionA   STM, problem solving, attention    Pain   denies pain at this  time   remain pain free   assess for pain q shift and prn    Skin   b/l groin wounds in healing stage   promote healing and prevent skin breakdown  assess skin q shift and prn, provide treatment to healing areas as ordered      Discharge Planning:  Home with wife to sister's home where he has been staying since getting out of CIR in 09/2023 after CVA. Wife works from home and can assist, along with other family members who are there. Aware will need to be 24/7 care at DC   Team Discussion: Patient admitted with debility post diabetic neuropathy, CVA, heart transplant with new CMV and remote ICH with left hemiparesis.  Limited by left shoulder subluxation, pain, slow processing, poor motor planning and task initiation.   Patient on target to meet rehab goals: yes, currently needs max assist for lower body ADLs and mod - max assist for upper body ADLs.  Needs max assist for transfers but able to ambulate up to 15' with max assist. Goals for discharge set for min assist overall.  *See Care Plan and progress notes for long and short-term goals.   Revisions to Treatment Plan:  EMST   Teaching Needs: Safety, medications, transfers, toileting, etc.   Current Barriers to Discharge: Decreased caregiver support and Home enviroment access/layout  Possible Resolutions to Barriers: Family education     Medical Summary Current Status: s/p  cardiac transplant, severe debility, on immunosuppresant medications  Barriers to Discharge: Medical stability;Spasticity   Possible Resolutions to Barriers/Weekly Focus: Heart failure team to consult, assess orthotics   Continued Need for Acute Rehabilitation Level of Care: The patient requires daily medical management by a physician with specialized training in physical medicine and rehabilitation for the following reasons: Direction of a multidisciplinary physical rehabilitation program to maximize functional independence : Yes Medical management of  patient stability for increased activity during participation in an intensive rehabilitation regime.: Yes Analysis of laboratory values and/or radiology reports with any subsequent need for medication adjustment and/or medical intervention. : Yes   I attest that I was present, lead the team conference, and concur with the assessment and plan of the team.   Fredericka Sober B 11/26/2023, 3:27 PM

## 2023-11-26 NOTE — Progress Notes (Signed)
 Patient ID: Jason Stevenson, male   DOB: 1985-05-03, 38 y.o.   MRN: [id number]  Met with pt to give team conference update with goals of min assist and target discharge date of 9/2. He was needing assist prior to admission due to CVA in May 2025 and now is deconditioned also from past admission for sepsis. Will let wife know when here later today. Pt is still getting used to the rehab program and feels he is doing better.

## 2023-11-26 NOTE — Progress Notes (Addendum)
 Advanced Heart Failure Rounding Note  Cardiologist: Maude Emmer, MD   Chief Complaint: S/p OHT, assistance with immunosuppression  Subjective:    Able to ambulate a few steps yesterday. No concerns.   Objective:   Weight Range: 87.5 kg Body mass index is 26.9 kg/m.   Vital Signs:   Temp:  [98.6 F (37 C)-98.7 F (37.1 C)] 98.6 F (37 C) (08/13 0521) Pulse Rate:  [92-98] 98 (08/13 0521) Resp:  [18-19] 19 (08/13 0521) BP: (132-143)/(91-97) 139/91 (08/13 0521) SpO2:  [99 %-100 %] 100 % (08/13 0521) Weight:  [87.5 kg] 87.5 kg (08/13 0612) Last BM Date : 11/21/23  Weight change: Filed Weights   11/24/23 1419 11/25/23 0515 11/26/23 0612  Weight: 83.5 kg 82.3 kg 87.5 kg    Intake/Output:   Intake/Output Summary (Last 24 hours) at 11/26/2023 0936 Last data filed at 11/26/2023 0852 Gross per 24 hour  Intake 480 ml  Output 1425 ml  Net -945 ml      Physical Exam    General:  Well appearing. Sitting up in bed. Neck: Healing tracheostomy site Cor: No JVD. Regular rate & rhythm. No murmurs. Lungs: clear Abdomen: soft, nontender, nondistended.  Extremities: no edema Neuro: alert & orientedx3, affect pleasant. Left sided weakness.    Labs    CBC Recent Labs    11/25/23 0443  WBC 4.1  NEUTROABS 1.2*  HGB 9.7*  HCT 29.5*  MCV 88.1  PLT 301   Basic Metabolic Panel Recent Labs    91/87/74 0443 11/26/23 0515  NA 135  --   K 4.3  --   CL 103  --   CO2 23  --   GLUCOSE 196*  --   BUN 16  --   CREATININE 1.06  --   CALCIUM  8.9  --   MG 1.5* 1.7  PHOS 4.5 4.3   Liver Function Tests Recent Labs    11/25/23 0443  AST 20  ALT 18  ALKPHOS 66  BILITOT 0.6  PROT 6.5  ALBUMIN 2.6*   No results for input(s): LIPASE, AMYLASE in the last 72 hours. Cardiac Enzymes No results for input(s): CKTOTAL, CKMB, CKMBINDEX, TROPONINI in the last 72 hours.  BNP: BNP (last 3 results) No results for input(s): BNP in the last 8760  hours.  ProBNP (last 3 results) No results for input(s): PROBNP in the last 8760 hours.   D-Dimer No results for input(s): DDIMER in the last 72 hours. Hemoglobin A1C Recent Labs    11/24/23 1537  HGBA1C 7.2*   Fasting Lipid Panel No results for input(s): CHOL, HDL, LDLCALC, TRIG, CHOLHDL, LDLDIRECT in the last 72 hours. Thyroid  Function Tests No results for input(s): TSH, T4TOTAL, T3FREE, THYROIDAB in the last 72 hours.  Invalid input(s): FREET3  Other results:   Imaging    No results found.    Medications:     Scheduled Medications:  atorvastatin   10 mg Oral Q supper   budesonide  (PULMICORT ) nebulizer solution  0.25 mg Nebulization BID   calcium  acetate  1,334 mg Oral TID WC   carvedilol   12.5 mg Oral BID WC   cycloSPORINE  modified  125 mg Oral BID   ezetimibe   10 mg Oral Daily   heparin  injection (subcutaneous)  5,000 Units Subcutaneous Q8H   insulin  aspart  0-5 Units Subcutaneous QHS   insulin  aspart  0-9 Units Subcutaneous TID WC   insulin  aspart  5 Units Subcutaneous TID WC   insulin  glargine-yfgn  26 Units Subcutaneous  Q2200   levETIRAcetam   1,500 mg Oral BID   lidocaine   2 patch Transdermal Q24H   melatonin  3 mg Oral QHS   OXcarbazepine   300 mg Oral Daily   OXcarbazepine   600 mg Oral QHS   pantoprazole   40 mg Oral Daily   predniSONE   5 mg Oral Q breakfast   pregabalin   75 mg Oral QHS   Ensure Max Protein  11 oz Oral BID   protein supplement  1 Scoop Oral TID WC   traZODone   50 mg Oral QHS   valGANciclovir   900 mg Oral Daily    Infusions:  magnesium  sulfate bolus IVPB      PRN Medications: acetaminophen , alum & mag hydroxide-simeth, bisacodyl , diclofenac  Sodium, diphenhydrAMINE , guaiFENesin -dextromethorphan, ipratropium-albuterol , prochlorperazine  **OR** prochlorperazine  **OR** prochlorperazine , senna-docusate, sodium phosphate    Patient Profile   38 y.o. 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   Transferred to CIR 11/24/23 after prolonged hospitalization at [facility] w/ CMV viremia c/b PEA arrest, subsequent [facility] ECMO cannulation >>VV ECMO and eventual trach.  Assessment/Plan  HFrEF d/t NICM s/p OHT 8/24 - s/p OHT 11/26/22. Last EMBx in 3/25 negative with normal EF.  - TTE pre-arrest with EF 40% and severely reduced RV function - s/p PEA arrest (see below) - RHC 10/15/23 with normal CI and elevated PCWP. EMBx taken 7/2 - TTE 7/29 with normal biV function.  - euvolemic on exam - Dr. Roshelle Traub discussed immunosuppression with [facility], Dr. Devore. Continue cyclosporine  125 mg bid. CyA goal 150-200 (346 on 08/11). Repeat CyA trough level on 08/14.  - he is now off MMF - continue bactrim  (end 11/26/23) - continue prednisone  5 mg daily - continue atorva 10 mg at bedtime + zetia  10 mg daily - continue coreg  12.5 mg bid. May need to add additional antihypertensive agent. Considering ARB. 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 - induced with ganciclovir - continue valganciclovir  900 mg daily (plan for 3 months)   PEA arrest on 10/01/23 - s/p [facility] ECLS transitioned to VV ECMO 6/21. De-cannulated 6/25. Trach placed.   - presumed respiratory etiology, possibly d/t unwitnessed seizure - ESBL Kleb HCAP s/p meropenem  - trach de-cannulated 8/4   H/o CVA - stroke with hemorrhagic conversion in 2022 and again in 4/25 - residual L sided deficits and seizures - continue keppra  and trileptal       Length of Stay: 2  FINCH, LINDSAY N, PA-C  11/26/2023, 9:36 AM  Advanced Heart Failure Team Pager [phone] (M-F; 7a - 5p)  Please contact [facility] for night-coverage after hours (5p -7a ) and weekends on [email or web address]  Patient seen and examined with the  above-signed Advanced Practice Provider and/or Housestaff. I personally reviewed laboratory data, imaging studies and relevant notes. I independently examined the patient and formulated the important aspects of the plan. I have edited the note to reflect any of my changes or salient points. I have personally discussed the plan with the patient and/or family.  Doing well. Fatigues at times. Took some steps yesterday  General:  Sitting up in chair  No resp difficulty HEENT: normal Neck: supple. no JVD. C+ healing trach site Cor: PMI nondisplaced. Regular rate & rhythm.  No rubs, gallops or murmurs. Lungs: clear Abdomen: soft, nontender, nondistended. No hepatosplenomegaly. No bruits or masses. Good bowel sounds. Extremities: no cyanosis, clubbing, rash, edema Neuro: alert & orientedx3, cranial nerves grossly intact.weak on L  Stable from cardiac perspective. Will check trough CyA level tomorrow. Continue oral valganciclovir  Appreciate CIR staff.  Toribio Fuel, MD  10:58 AM

## 2023-11-26 NOTE — Patient Care Conference (Cosign Needed)
 Inpatient RehabilitationTeam Conference and Plan of Care Update Date: 11/26/2023   Time: 10:29 AM    Patient Name: Jason Stevenson      Medical Record Number: [id number]  Date of Birth: 07-08-1985 Sex: Male         Room/Bed: 4W23C/4W23C-01 Payor Info: Payor: [place] MEDICAID PREPAID HEALTH PLAN / Plan: [place] [employer] [place] / Product Type: *No Product type* /    Admit Date/Time:  11/24/2023  2:06 PM  Primary Diagnosis:  Debility  Hospital Problems: Principal Problem:   Debility    Expected Discharge Date: Expected Discharge Date: 12/16/23  Team Members Present: Physician leading conference: Dr. Prentice Compton Social Worker Present: Rhoda Clement, LCSW Nurse Present: Barnie Ronde, RN OT Present: Katheryn Mines, OT SLP Present: Blaise Alderman, SLP PPS Coordinator present : Eleanor Colon, SLP     Current Status/Progress Goal Weekly Team Focus  Bowel/Bladder   continent of bladder and bowel   maintian continence and promote regular bowel pattern   provide toileting q 3-4 hours and prn    Swallow/Nutrition/ Hydration               ADL's   MAX A LB ADLs, MOD_MAX UB ADLs, stand pivot without RW heavy MOD // Barriers: slowed processing, slow task initation,   MIN A- will continue to assess as Pt continues to progress, hopefully can upgrade to CGA   Evaluation, Pt education, L NMR, functional transfer training, ADL retraining    Mobility   Bed mobility = MaxA, Transfers = MaxA, Ambulation  = MaxA for 15' using hallway HR   overall MinA for mobility, CGA for transfers  Barriers: multiple recent comorbidities, decreased motor planning/ control /// Work On: NMR L hemibody, seated/ standing balance, gait training, motor control, strengthening, pt/ family education    Communication   low vocal intensity   supervision A   EMST    Safety/Cognition/ Behavioral Observations  minA   supervisionA   STM, problem solving, attention    Pain   denies pain at this  time   remain pain free   assess for pain q shift and prn    Skin   b/l groin wounds in healing stage   promote healing and prevent skin breakdown  assess skin q shift and prn, provide treatment to healing areas as ordered      Discharge Planning:  Home with wife to sister's home where he has been staying since getting out of CIR in 09/2023 after CVA. Wife works from home and can assist, along with other family members who are there. Aware will need to be 24/7 care at DC   Team Discussion: Patient admitted with debility post diabetic neuropathy, CVA, heart transplant with new CMV and remote ICH with left hemiparesis.  Limited by left shoulder subluxation, pain, slow processing, poor motor planning and task initiation.   Patient on target to meet rehab goals: yes, currently needs max assist for lower body ADLs and mod - max assist for upper body ADLs.  Needs max assist for transfers but able to ambulate up to 15' with max assist. Goals for discharge set for min assist overall.  *See Care Plan and progress notes for long and short-term goals.   Revisions to Treatment Plan:  EMST   Teaching Needs: Safety, medications, transfers, toileting, etc.   Current Barriers to Discharge: Decreased caregiver support and Home enviroment access/layout  Possible Resolutions to Barriers: Family education     Medical Summary Current Status: s/p  cardiac transplant, severe debility, on immunosuppresant medications  Barriers to Discharge: Medical stability;Spasticity   Possible Resolutions to Barriers/Weekly Focus: Heart failure team to consult, assess orthotics   Continued Need for Acute Rehabilitation Level of Care: The patient requires daily medical management by a physician with specialized training in physical medicine and rehabilitation for the following reasons: Direction of a multidisciplinary physical rehabilitation program to maximize functional independence : Yes Medical management of  patient stability for increased activity during participation in an intensive rehabilitation regime.: Yes Analysis of laboratory values and/or radiology reports with any subsequent need for medication adjustment and/or medical intervention. : Yes   I attest that I was present, lead the team conference, and concur with the assessment and plan of the team.   Fredericka Sober B 11/26/2023, 3:27 PM

## 2023-11-26 NOTE — Evaluation (Addendum)
 Physical Therapy Assessment and Plan  Patient Details  Name: Jason Stevenson MRN: [id number] Date of Birth: Apr 18, 1985  PT Diagnosis: Coordination disorder, Difficulty walking, Hemiparesis non-dominant, Impaired cognition, Impaired sensation, and Muscle weakness Rehab Potential: Fair ELOS: 21-24 days   Today's Date: 11/25/2023 PT Individual Time: 1102-1200 PT Individual Time Calculation (min): 58 min  Hospital Problem: Principal Problem:   Debility   Past Medical History:  Past Medical History:  Diagnosis Date   Chronic systolic CHF (congestive heart failure) (HCC) 06/18/2015   Heart transplant recipient [facility])    Hemorrhagic stroke (HCC)    Hyperlipidemia    Hypertension    Immune deficiency disorder (HCC)    Neuropathy    Nonischemic cardiomyopathy (HCC) Noted as early as 2007   Per chart review (cards consult note 2011), EF of 40% in 2007, down to 20-25% in 2011   Seizures (HCC)    Stroke (cerebrum) (HCC)    Type 2 diabetes mellitus (HCC)    Past Surgical History:  Past Surgical History:  Procedure Laterality Date   ICD IMPLANT N/A 08/27/2021   Procedure: ICD IMPLANT;  Surgeon: Cindie Ole DASEN, MD;  Location: [facility] INVASIVE CV LAB;  Service: Cardiovascular;  Laterality: N/A;   None     RIGHT HEART CATH  06/21/2021   [facility]   RIGHT HEART CATH N/A 05/17/2022   Procedure: RIGHT HEART CATH;  Surgeon: Rolan Ezra RAMAN, MD;  Location: [facility] INVASIVE CV LAB;  Service: Cardiovascular;  Laterality: N/A;   RIGHT/LEFT HEART CATH AND CORONARY ANGIOGRAPHY N/A 11/02/2020   Procedure: RIGHT/LEFT HEART CATH AND CORONARY ANGIOGRAPHY;  Surgeon: Rolan Ezra RAMAN, MD;  Location: [facility] INVASIVE CV LAB;  Service: Cardiovascular;  Laterality: N/A;    Assessment & Plan Clinical Impression: Patient is a 38 y.o. 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  He had an 8-day length of stay  according to the patient.  He was discharged to home ambulatory.  Patient could not provide any further info regarding his functional status between his discharge from [facility] and his stroke sustained in [place].  Thinks he was probably at home but did not know for sure.  He had a short 3-day hospitalization for weakness in March 2025 graft rejection and infection without.  The patient went home again, was visiting [place] 07/25/2023 when he developed a right basal ganglia hemorrhage and admitted to the hospital admission.  He was transferred to inpatient rehab and he was discharged to home 09/18/23.  Because of weakness the patient was taken to [facility] ED on 09/30/23 with hypotension, diarrhea, weakness, lethargy and felt to be septic therefore transferred to [facility] for management. Hospital course significant for PEA arrest  with 5 rounds of CPR w/ROSC, repeat arrest 06/18 requiring ECMO thorough 06/21.  He was started on broad spectrum antibiotics. 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 Hospital course significant for respiratory arrest question due to seizures requiring intubation and eventually tracheostomy, ESBL Kleb HCAP, CMV viremia, bilateral pleural effusions s/p pigtail catheter drainage, renal failure requiring HD, required multiple units PRBCs, superficial thrombus right basilic vein.   The patient had a tracheostomy while at [facility] and was decannulated on 11/17/2022.  The patient had Dobbhoff feeding tube while at [facility] and after passing his F EES on 717 his tube was removed on 11/12/2023.  Chest tubes were removed on  11/16/2023.   In terms of CMV infection, Treated with induction dose ganciclovir and transitioned to maintenance dose on 07/26 w/last CMV PCR <260 and planss for 3 month course (~02/08/24) with every 2 week CMV PCR upon cessation X 3 months.Diet has been advanced to regular textures w/thin liquids. Renal status improved and he continues to renal diet  due to elevated phosphorous with binders TID.  Cardiology and endocrinology has been following to assist with medical issues. Therapy was consulted and has been working with patient on pregait activity. He requires min to max assist overall and CIR recommended due to recent decline.  Patient transferred to CIR on 11/24/2023 .   Patient currently requires max assist with mobility secondary to muscle weakness, decreased cardiorespiratoy endurance, impaired timing and sequencing, unbalanced muscle activation, and decreased coordination, decreased attention to left and decreased motor planning, decreased initiation, decreased attention, decreased problem solving, decreased safety awareness, decreased memory, and delayed processing, and decreased sitting balance, decreased standing balance, decreased postural control, hemiplegia, and decreased balance strategies.  Prior to hospitalization, patient was mod with mobility and lived with Spouse in a House home.  Home access is  Ramped entrance.  Patient will benefit from skilled PT intervention to maximize safe functional mobility, minimize fall risk, and decrease caregiver burden for planned discharge home with 24 hour assist.  Anticipate patient will benefit from follow up [facility] at discharge.  PT - End of Session Activity Tolerance: Tolerates 30+ min activity with multiple rests Endurance Deficit: Yes PT Assessment Rehab Potential (ACUTE/IP ONLY): Fair PT Barriers to Discharge: Decreased caregiver support;Other (comments);Lack of/limited family support (reduced motor control) PT Patient demonstrates impairments in the following area(s): Balance;Endurance;Motor;Perception;Safety;Sensory PT Transfers Functional Problem(s): Bed Mobility;Bed to Chair;Car;Furniture PT Locomotion Functional Problem(s): Ambulation;Wheelchair Mobility;Stairs PT Plan PT Intensity: Minimum of 1-2 x/day ,45 to 90 minutes PT Frequency: 5 out of 7 days PT Duration Estimated Length of  Stay: 21-24 days PT Treatment/Interventions: Ambulation/gait training;Cognitive remediation/compensation;Discharge planning;DME/adaptive equipment instruction;Functional mobility training;Pain management;Psychosocial support;Splinting/orthotics;Therapeutic Activities;UE/LE Strength taining/ROM;Visual/perceptual remediation/compensation;Wheelchair propulsion/positioning;UE/LE Coordination activities;Therapeutic Exercise;Skin care/wound management;Stair training;Patient/family education;Neuromuscular re-education;Functional electrical stimulation;Disease management/prevention;Community reintegration;Balance/vestibular training PT Transfers Anticipated Outcome(s): CGA PT Locomotion Anticipated Outcome(s): MinA for ambulation; supervision for w/c mobility PT Recommendation Recommendations for Other Services: Therapeutic Recreation consult Therapeutic Recreation Interventions: Pet therapy;Kitchen group;Stress management;Outing/community reintergration Follow Up Recommendations: Home health PT;24 hour supervision/assistance Patient destination: Home Equipment Recommended: To be determined   PT Evaluation Precautions/Restrictions Precautions Precautions: Fall Precaution/Restrictions Comments: L Hemiplegia Restrictions Weight Bearing Restrictions Per Provider Order: No General   Vital SignsTherapy Vitals Temp: 98.6 F (37 C) Temp Source: Oral Pulse Rate: 98 Resp: 19 BP: (!) 139/91 Patient Position (if appropriate): Lying Oxygen Therapy SpO2: 100 % O2 Device: Room Air Pain Pain Assessment Pain Scale: 0-10 Pain Score: 0-No pain Pain Interference Pain Interference Pain Effect on Sleep: 1. Rarely or not at all Pain Interference with Therapy Activities: 1. Rarely or not at all Pain Interference with Day-to-Day Activities: 1. Rarely or not at all Home Living/Prior Functioning Home Living Available Help at Discharge: Family Type of Home: House Home Access: Ramped entrance Home Layout:  One level Bathroom Shower/Tub: Engineer, manufacturing systems: Standard Bathroom Accessibility: Yes  Lives With: Spouse Prior Function Level of Independence: Needs assistance with ADLs;Requires assistive device for independence (Pt using wc/RW for mobility and transfers following CVA and requiring assistance for ADLs) Driving: No (has not been driving since having CVA) Vocation: Unemployed Vision/Perception  Vision - History Ability to See in Adequate Light: 0 Adequate Vision -  Assessment Eye Alignment: Within Functional Limits Ocular Range of Motion: Within Functional Limits Alignment/Gaze Preference: Gaze right Tracking/Visual Pursuits: Able to track stimulus in all quads without difficulty Perception Perception: Impaired Preception Impairment Details: Inattention/Neglect Perception-Other Comments: L inattention with R gaze preference Praxis Praxis: Impaired Praxis Impairment Details: Motor planning;Initiation Praxis-Other Comments: very slow to plan movements and then to initiate  Cognition Overall Cognitive Status: Impaired/Different from baseline Arousal/Alertness: Awake/alert Orientation Level: Oriented X4 Sustained Attention: Impaired Memory: Impaired Problem Solving: Impaired Safety/Judgment: Impaired Sensation Sensation Light Touch: Impaired by gross assessment Proprioception: Impaired Detail Proprioception Impaired Details: Impaired LUE;Impaired LLE Coordination Gross Motor Movements are Fluid and Coordinated: No Fine Motor Movements are Fluid and Coordinated: No Heel Shin Test: unable with LLE; slow to initiate and perform touch to opposite shin with RLE Motor  Motor Motor: Hemiplegia;Motor impersistence Motor - Skilled Clinical Observations: L hemiplegia   Trunk/Postural Assessment  Cervical Assessment Cervical Assessment: Exceptions to [facility] (mild forward head) Thoracic Assessment Thoracic Assessment: Exceptions to [facility] (rounded shoudlers) Lumbar  Assessment Lumbar Assessment: Within Functional Limits Postural Control Postural Control: Deficits on evaluation Trunk Control: decreased Righting Reactions: decreased  Balance Balance Balance Assessed: Yes Static Sitting Balance Static Sitting - Balance Support: Feet supported Static Sitting - Level of Assistance: 5: Stand by assistance Dynamic Sitting Balance Dynamic Sitting - Balance Support: Feet supported;During functional activity Dynamic Sitting - Level of Assistance: 4: Min assist;3: Mod assist Static Standing Balance Static Standing - Balance Support: During functional activity;Left upper extremity supported Static Standing - Level of Assistance: 3: Mod assist Dynamic Standing Balance Dynamic Standing - Balance Support: During functional activity;Left upper extremity supported Dynamic Standing - Level of Assistance: 2: Max assist Extremity Assessment      RLE Assessment RLE Assessment: Exceptions to [facility] RLE Strength Right Hip Flexion: 3-/5 Right Hip Extension: 3-/5 Right Hip ABduction: 3/5 Right Hip ADduction: 3/5 Right Knee Flexion: 4-/5 Right Knee Extension: 4-/5 Right Ankle Dorsiflexion: 4/5 Right Ankle Plantar Flexion: 4/5 LLE Assessment LLE Assessment: Exceptions to [facility] LLE Strength Left Hip Flexion: 3-/5 Left Hip Extension: 3-/5 Left Hip ABduction: 2-/5 Left Hip ADduction: 2-/5 Left Knee Flexion: 3-/5 Left Knee Extension: 3-/5 Left Ankle Dorsiflexion: 1/5 Left Ankle Plantar Flexion: 0/5  Care Tool Care Tool Bed Mobility Roll left and right activity   Roll left and right assist level: Moderate Assistance - Patient 50 - 74%    Sit to lying activity   Sit to lying assist level: Maximal Assistance - Patient 25 - 49%    Lying to sitting on side of bed activity   Lying to sitting on side of bed assist level: the ability to move from lying on the back to sitting on the side of the bed with no back support.: Maximal Assistance - Patient 25 - 49%      Care Tool Transfers Sit to stand transfer   Sit to stand assist level: Maximal Assistance - Patient 25 - 49%    Chair/bed transfer   Chair/bed transfer assist level: Total Assistance - Patient < 25%    Licensed conveyancer transfer activity did not occur: Safety/medical concerns        Care Tool Locomotion Ambulation   Assist level: Maximal Assistance - Patient 25 - 49% Assistive device: Other (comment) (R hallway HR) Max distance: 15 ft  Walk 10 feet activity   Assist level: Maximal Assistance - Patient 25 - 49% Assistive device: Other (comment) (R hallway HR)   Walk 50 feet with 2 turns activity  Walk 50 feet with 2 turns activity did not occur: Safety/medical concerns      Walk 150 feet activity Walk 150 feet activity did not occur: Safety/medical concerns      Walk 10 feet on uneven surfaces activity Walk 10 feet on uneven surfaces activity did not occur: Safety/medical concerns      Stairs Stair activity did not occur: Safety/medical concerns        Walk up/down 1 step activity Walk up/down 1 step or curb (drop down) activity did not occur: Safety/medical concerns      Walk up/down 4 steps activity Walk up/down 4 steps activity did not occur: Safety/medical concerns      Walk up/down 12 steps activity Walk up/down 12 steps activity did not occur: Safety/medical concerns      Pick up small objects from floor Pick up small object from the floor (from standing position) activity did not occur: Safety/medical concerns      Wheelchair Is the patient using a wheelchair?: Yes Type of Wheelchair: Manual   Wheelchair assist level: Dependent - Patient 0% Max wheelchair distance: 250 ft  Wheel 50 feet with 2 turns activity   Assist Level: Dependent - Patient 0%  Wheel 150 feet activity   Assist Level: Dependent - Patient 0%    Refer to Care Plan for Long Term Goals  SHORT TERM GOAL WEEK 1 PT Short Term Goal 1 (Week 1): Pt willperform bed mobility with consistent  ModA overall. PT Short Term Goal 2 (Week 1): Pt will perform sit<>stand transfers with no AD and modA. PT Short Term Goal 3 (Week 1): Pt will perform squat pivot transfers with MinA. PT Short Term Goal 4 (Week 1): Pt will perform stand pivot transfers with ModA +1. PT Short Term Goal 5 (Week 1): Pt will perform ambulation with MaxA +1 using RW for at least 25 ft.  Recommendations for other services: Therapeutic Recreation  Pet therapy, Kitchen group, Stress management, and Outing/community reintegration  Skilled Therapeutic Intervention Mobility Bed Mobility Bed Mobility: Rolling Right;Rolling Left;Supine to Sit Rolling Right: Maximal Assistance - Patient 25-49%;Moderate Assistance - Patient 50-74% Rolling Left: Moderate Assistance - Patient 50-74% Supine to Sit: Maximal Assistance - Patient - Patient 25-49% Transfers Transfers: Sit to Stand;Stand to Sit;Stand Pivot Transfers Sit to Stand: Maximal Assistance - Patient 25-49%;Moderate Assistance - Patient 50-74% Stand to Sit: Moderate Assistance - Patient 50-74% Stand Pivot Transfers: Maximal Assistance - Patient 25 - 49% Stand Pivot Transfer Details: Verbal cues for technique;Verbal cues for sequencing;Verbal cues for precautions/safety;Manual facilitation for weight shifting Stand Pivot Transfer Details (indicate cue type and reason): STEDY also used for safety and less physical assist Transfer (Assistive device): Other (Comment) (significant 1-person assist) Locomotion  Gait Ambulation: Yes Gait Distance (Feet): 15 Feet Assistive device: Other (Comment) (R hallway HR) Gait Gait: Yes Gait Pattern: Impaired Gait Pattern: Step-to pattern;Decreased step length - right;Decreased step length - left;Decreased weight shift to left;Decreased dorsiflexion - left;Left flexed knee in stance;Narrow base of support;Poor foot clearance - left (requires block to L knee during stance phase and assist to advance LLE, foot drop) Gait velocity:  significantly reduced Stairs / Additional Locomotion Stairs: No Wheelchair Mobility Wheelchair Mobility: No  Skilled Intervention: PT Evaluation completed; see above for results. PT educated patient in roles of PT vs OT, PT POC, rehab potential, rehab goals, and discharge recommendations along with recommendation for follow-up rehabilitation services. Individual treatment initiated:  Patient seated upright upon PT arrival. Patient alert and agreeable to PT session.  No pain complaint during session.  Assessed in above mobility and of note, pt continues to require significant physical assist for all standing mobility and balance d/t significant reduction in motor control, motor planning, proprioception, sensation. Requires vc for sequencing and extra time to plan and execute with up to MaxA. Plan for increased NMR focus in sessions.   Patient seated upright in w/c with tray table near at end of session with brakes locked, belt alarm set, and all needs within reach. Lab tech present for blood draw.    Discharge Criteria: Patient will be discharged from PT if patient refuses treatment 3 consecutive times without medical reason, if treatment goals not met, if there is a change in medical status, if patient makes no progress towards goals or if patient is discharged from hospital.  The above assessment, treatment plan, treatment alternatives and goals were discussed and mutually agreed upon: by patient  [name] PT, DPT, CSRS 11/25/2023, 6:18 AM

## 2023-11-26 NOTE — Progress Notes (Signed)
 Physical Therapy Session Note  Patient Details  Name: Jason Stevenson MRN: [id number] Date of Birth: Sep 17, 1985  Today's Date: 11/26/2023 PT Individual Time: 1102-1204 PT Individual Time Calculation (min): 62 min   Short Term Goals: Week 1:  PT Short Term Goal 1 (Week 1): Pt willperform bed mobility with consistent ModA overall. PT Short Term Goal 2 (Week 1): Pt will perform sit<>stand transfers with no AD and modA. PT Short Term Goal 3 (Week 1): Pt will perform squat pivot transfers with MinA. PT Short Term Goal 4 (Week 1): Pt will perform stand pivot transfers with ModA +1. PT Short Term Goal 5 (Week 1): Pt will perform ambulation with MaxA +1 using RW for at least 25 ft.  Skilled Therapeutic Interventions/Progress Updates:  Patient supine in bed on entrance to room. Patient alert and agreeable to PT session. Slow to initiate throughout session.   Patient with no pain complaint at start of session.  Therapeutic Activity/ NMR: Bed Mobility: Pt performed supine > sit with slow initiation and inability to complete without Mod/ MaxA to bring BLE off EOB and then bring trunk up to upright seated position. Is able to maintain unsupported seated position. VC/ tc required throughout for need to execute forward lean and anterior weight shift prior to all mobility. Is able to scoot laterally with Mod A initially in order to facilitate proper technique and then improves to MinA/ CGA. Transfers: Pt performed squat pivot transfer to R side bed>w/c. Instructed in proper positioning and technique prior to attempt and requires heavy modA to complete.   Sit<>stand transfers performed in // bars with ModA. Provided continued vc/ tc for forward lean, reach forward with RUE, and then BLE activation. Pt biases toward R side during transfer and then in stance. Facilitated in weight shift toward LLE. Intermittent need for block to L knee but does demo quad and glute activation. Is able to progress to stance with  CGA and RUE hand hold to // bar but then demos motor impersistence and need for continued block to L knee. Standing balance performed with focus to motor control of LLE. Assisted pt with knee guard/ block to perform minisquats.   NMR performed for improvements in motor control and coordination, balance, sequencing, judgement, and self confidence/ efficacy in performing all aspects of mobility at highest level of independence.   Patient seated upright in w/c at end of session with brakes locked, belt alarm set, and all needs within reach.   Therapy Documentation Precautions:  Precautions Precautions: Fall Precaution/Restrictions Comments: L Hemiplegia Restrictions Weight Bearing Restrictions Per Provider Order: No  Pain:  No pain related by pt throughout session.    Therapy/Group: Individual Therapy  [name] PT, DPT, CSRS 11/26/2023, 6:15 PM

## 2023-11-26 NOTE — Plan of Care (Signed)
 Problem: RH Balance Goal: LTG Patient will maintain dynamic sitting balance (PT) Description: LTG:  Patient will maintain dynamic sitting balance with assistance during mobility activities (PT) Flowsheets (Taken 11/26/2023 0630) LTG: Pt will maintain dynamic sitting balance during mobility activities with:: Independent with assistive device  Goal: LTG Patient will maintain dynamic standing balance (PT) Description: LTG:  Patient will maintain dynamic standing balance with assistance during mobility activities (PT) Flowsheets (Taken 11/26/2023 0630) LTG: Pt will maintain dynamic standing balance during mobility activities with:: Moderate Assistance - Patient 50 - 74%   Problem: Sit to Stand Goal: LTG:  Patient will perform sit to stand with assistance level (PT) Description: LTG:  Patient will perform sit to stand with assistance level (PT) Flowsheets (Taken 11/26/2023 0630) LTG: PT will perform sit to stand in preparation for functional mobility with assistance level: Contact Guard/Touching assist   Problem: RH Bed Mobility Goal: LTG Patient will perform bed mobility with assist (PT) Description: LTG: Patient will perform bed mobility with assistance, with/without cues (PT). Flowsheets (Taken 11/26/2023 0630) LTG: Pt will perform bed mobility with assistance level of: Supervision/Verbal cueing   Problem: RH Bed to Chair Transfers Goal: LTG Patient will perform bed/chair transfers w/assist (PT) Description: LTG: Patient will perform bed to chair transfers with assistance (PT). Flowsheets (Taken 11/26/2023 0630) LTG: Pt will perform Bed to Chair Transfers with assistance level: Contact Guard/Touching assist   Problem: RH Car Transfers Goal: LTG Patient will perform car transfers with assist (PT) Description: LTG: Patient will perform car transfers with assistance (PT). Flowsheets (Taken 11/26/2023 0630) LTG: Pt will perform car transfers with assist:: Contact Guard/Touching assist    Problem: RH Furniture Transfers Goal: LTG Patient will perform furniture transfers w/assist (OT/PT) Description: LTG: Patient will perform furniture transfers  with assistance (OT/PT). Flowsheets (Taken 11/26/2023 0630) LTG: Pt will perform furniture transfers with assist:: Contact Guard/Touching assist   Problem: RH Ambulation Goal: LTG Patient will ambulate in controlled environment (PT) Description: LTG: Patient will ambulate in a controlled environment, # of feet with assistance (PT). Flowsheets (Taken 11/26/2023 0630) LTG: Pt will ambulate in controlled environ  assist needed:: Minimal Assistance - Patient > 75% LTG: Ambulation distance in controlled environment: up to 150 ft using LRAD Goal: LTG Patient will ambulate in home environment (PT) Description: LTG: Patient will ambulate in home environment, # of feet with assistance (PT). Flowsheets (Taken 11/26/2023 0630) LTG: Pt will ambulate in home environ  assist needed:: Minimal Assistance - Patient > 75% LTG: Ambulation distance in home environment: up to 50 ft using LRAD   Problem: RH Wheelchair Mobility Goal: LTG Patient will propel w/c in controlled environment (PT) Description: LTG: Patient will propel wheelchair in controlled environment, # of feet with assist (PT) Flowsheets (Taken 11/26/2023 0630) LTG: Pt will propel w/c in controlled environ  assist needed:: Supervision/Verbal cueing LTG: Propel w/c distance in controlled environment: more than 150 ft Goal: LTG Patient will propel w/c in home environment (PT) Description: LTG: Patient will propel wheelchair in home environment, # of feet with assistance (PT). Flowsheets (Taken 11/26/2023 0630) Distance: wheelchair distance in controlled environment: 150 LTG: Propel w/c distance in home environment: up to 50 ft per bout   Problem: RH Stairs Goal: LTG Patient will ambulate up and down stairs w/assist (PT) Description: LTG: Patient will ambulate up and down # of stairs with  assistance (PT) Flowsheets (Taken 11/26/2023 0630) LTG: Pt will ambulate up/down stairs assist needed:: Minimal Assistance - Patient > 75% LTG: Pt will  ambulate up  and down number of stairs: at least 1 curb step in order to mobilize in community

## 2023-11-26 NOTE — Progress Notes (Signed)
 PROGRESS NOTE   Subjective/Complaints:  Using RMST with SLP, pt appears distracted easily from task, also Ht failure team PA in room  Review of systems no shortness of breath no abdominal pain no constipation no urinary problems  Objective:   DG Chest 2 View Result Date: 11/24/2023 CLINICAL DATA:  Follow-up EXAM: CHEST - 2 VIEW COMPARISON:  Chest x-ray 09/30/2023 FINDINGS: The heart is enlarged. The lungs are clear. There is no pleural effusion or pneumothorax. Sternotomy wires are present. No acute fractures are identified. IMPRESSION: Cardiomegaly. No acute cardiopulmonary process. Electronically Signed   By: Greig Pique M.D.   On: 11/24/2023 20:58   Recent Labs    11/25/23 0443  WBC 4.1  HGB 9.7*  HCT 29.5*  PLT 301   Recent Labs    11/25/23 0443  NA 135  K 4.3  CL 103  CO2 23  GLUCOSE 196*  BUN 16  CREATININE 1.06  CALCIUM  8.9    Intake/Output Summary (Last 24 hours) at 11/26/2023 0859 Last data filed at 11/26/2023 9147 Gross per 24 hour  Intake 600 ml  Output 1425 ml  Net -825 ml        Physical Exam: Vital Signs Blood pressure (!) 139/91, pulse 98, temperature 98.6 F (37 C), temperature source Oral, resp. rate 19, height 5' 11 (1.803 m), weight 87.5 kg, SpO2 100%.  HEENT trach site well healed , dressing removed  General: No acute distress Mood and affect are appropriate Heart: Regular rate and rhythm no rubs murmurs or extra sounds Lungs: Clear to auscultation, breathing unlabored, no rales or wheezes Abdomen: Positive bowel sounds, soft nontender to palpation, nondistended Extremities: No clubbing, cyanosis, or edema Skin: No evidence of breakdown, no evidence of rash Neurologic: Cranial nerves II through XII intact, motor strength is 4/5 in right deltoid, bicep, tricep, grip, hip flexor, knee extensors, ankle dorsiflexor and plantar flexor LUE  2 -/5 left deltoid, bicep, tricep, finger  flexors and trace extensors. Left lower extremity 3 - hip flexor 4 - knee extensor 0 ankle dorsiflexor and plantar flexor Sensory exam normal sensation to light touch is reduced on the left side below the knee as well as in the left upper extremity cerebellar exam normal finger to nose to finger as well as heel to shin in bilateral upper and lower extremities Musculoskeletal: Full range of motion in all 4 extremities. No joint swelling     Assessment/Plan: 1. Functional deficits which require 3+ hours per day of interdisciplinary therapy in a comprehensive inpatient rehab setting. Physiatrist is providing close team supervision and 24 hour management of active medical problems listed below. Physiatrist and rehab team continue to assess barriers to discharge/monitor patient progress toward functional and medical goals  Care Tool:  Bathing    Body parts bathed by patient: Left arm, Face, Abdomen, Chest, Right upper leg, Left upper leg   Body parts bathed by helper: Buttocks, Front perineal area, Left lower leg, Left arm, Right arm, Right lower leg     Bathing assist Assist Level: Maximal Assistance - Patient 24 - 49%     Upper Body Dressing/Undressing Upper body dressing   What is the patient wearing?: Pull  over shirt    Upper body assist Assist Level: Maximal Assistance - Patient 25 - 49%    Lower Body Dressing/Undressing Lower body dressing      What is the patient wearing?: Pants, Underwear/pull up     Lower body assist Assist for lower body dressing: Maximal Assistance - Patient 25 - 49%     Toileting Toileting    Toileting assist Assist for toileting: Total Assistance - Patient < 25%     Transfers Chair/bed transfer  Transfers assist     Chair/bed transfer assist level: Total Assistance - Patient < 25%     Locomotion Ambulation   Ambulation assist      Assist level: Maximal Assistance - Patient 25 - 49% Assistive device: Other (comment) (R hallway  HR) Max distance: 15 ft   Walk 10 feet activity   Assist     Assist level: Maximal Assistance - Patient 25 - 49% Assistive device: Other (comment) (R hallway HR)   Walk 50 feet activity   Assist Walk 50 feet with 2 turns activity did not occur: Safety/medical concerns         Walk 150 feet activity   Assist Walk 150 feet activity did not occur: Safety/medical concerns         Walk 10 feet on uneven surface  activity   Assist Walk 10 feet on uneven surfaces activity did not occur: Safety/medical concerns         Wheelchair     Assist Is the patient using a wheelchair?: Yes Type of Wheelchair: Manual    Wheelchair assist level: Dependent - Patient 0% Max wheelchair distance: 250 ft    Wheelchair 50 feet with 2 turns activity    Assist        Assist Level: Dependent - Patient 0%   Wheelchair 150 feet activity     Assist      Assist Level: Dependent - Patient 0%   Blood pressure (!) 139/91, pulse 98, temperature 98.6 F (37 C), temperature source Oral, resp. rate 19, height 5' 11 (1.803 m), weight 87.5 kg, SpO2 100%.  Medical Problem List and Plan: 1. Functional deficits secondary to debility following PEA arrest             -patient may  shower             -ELOS/Goals: 18-21d MinA Team conference today please see physician documentation under team conference tab, met with team  to discuss problems,progress, and goals. Formulized individual treatment plan based on medical history, underlying problem and comorbidities.  2.  Antithrombotics: -DVT/anticoagulation:  Pharmaceutical: Lovenox              -antiplatelet therapy: N/A due to IPH 3. Pain Management: Tylenol  prn Been on Lyrica  at home for diabetic neuropathic pain. 4. Mood/Behavior/Sleep: LCSW to follow for evaluation and support.              -antipsychotic agents: N/A 5. Neuropsych/cognition: This patient is not capable of making decisions on his own behalf without input  from [facility]. 6. Skin/Wound Care: Routine pressure relief measures. Monitor incision for healing.  7. Fluids/Electrolytes/Nutrition: Strict I/O. Renal diet with CM restrictions --no fluid restriction needed 8. PEA arrest X 2/CMV viremia: on Valcyte  900 mg daily thru 10/26 with monitoring of levels every 2 weeks post completion.  Pharmacy consult  9. Heart transplant: On Gengraf  BID with prednisone  daily             --continue Coreg , lower dose Lipitor   and Zetia .  Heart Failure team will follow  10. Elevated phosphorous: Will keep on liberalized diet for food choices and to help with intake             --continue Phoslo  1334 tid ac--recheck level in am. 11. Hx seizures: Has been stable on Keppra  and Trileptal .  12. 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  Monitor BS ac/hs. Continue Insulin  glargine with novolog  tid ac  CBG (last 3)  Recent Labs    11/25/23 2317 11/25/23 2355 11/26/23 0635  GLUCAP 72 124* 190*    13. Anemia of chronic disease: Monitor H/H for stability.    LOS: 2 days A FACE TO FACE EVALUATION WAS PERFORMED  Jason Stevenson 11/26/2023, 8:59 AM

## 2023-11-26 NOTE — Progress Notes (Signed)
 Occupational Therapy Session Note  Patient Details  Name: Jason Stevenson MRN: [id number] Date of Birth: [birth year]  Today's Date: 11/26/2023 OT Individual Time: 8698-8575 OT Individual Time Calculation (min): 83 min    Short Term Goals: Week 1:  OT Short Term Goal 1 (Week 1): Pt wil recall hemi-dressing techniques with MIN questioning cues OT Short Term Goal 2 (Week 1): Pt will maintain L UE in safety position during ADL and functional transfers with MIN A OT Short Term Goal 3 (Week 1): Pt will complete LB dressing with MOD A OT Short Term Goal 4 (Week 1): Pt will complete 1/3 steps of toileting with MIN A  Skilled Therapeutic Interventions/Progress Updates:     Pt received sitting up in wc presenting with flat affect, however to be in good spirits receptive to skilled OT session reporting 0/10 pain- OT offering intermittent rest breaks, repositioning, and therapeutic support to optimize participation in therapy session. Pt requesting to shower, focused this session on ADL retraining with emphasis on hemi-techniques, activity tolerance, and functional movement patterns. Pt reporting need to use restroom. Transported Pt into bathroom total A in wc. Educated Pt on squat pivot technique with Pt then able to squat pivot to L to standard toilet with MAX A- would benefit from additional practice to learn head/hip relationship. Pt stood while using grab bar heavy MOD A and maintained standing balance with MIN A while OT assisted with clothing management. Increased time provided on toilet- continent B&B, nursing staff informed. Peri-care completed while standing in STEDY with MAX A, Pt able to maintain standing balance with MIN A in STEDY requiring heavy MOD A to complete sit<>stand. Transported Pt to TTB positioned in shower via STEDY. Pt with sufficient sitting balance to maintain dynamic sitting balance during bathing tasks with MIN A when reaching towards ground and SUP when provided with posterior  trunk support- verbal cues required to correct L lean. Provided education on long handled sponge use and hemi-techniques with Pt demonstrating teach back as evidence of learning completing bathing tasks at MOD A overall. Very slowed task initiation and processing speed noted during ADL with Pt requiring significantly increased amount of time. Following shower, Pt dried self with MAX and was transported to EOB via A. MAX A required to complete sit > stand in STEDY from TTB. Pt able to maintain perched sitting position with CGA using HOH technique to stabilize L UE on grab bar. Education provided on hemi-dressing techniques with Pt able to donn OH shirt with MOD A +increased time and MAX verbal cues. Pt's feet weaved into underwear total A d/t Pt fatigue and time constraints. He stood with MOD A +2 MIN A and maintained standing balance MOD A while OT brought pants to waist. Pt returned to bed with MAX A. Pt was left resting in bed with call bell in reach, bed alarm on, and all needs met.    Therapy Documentation Precautions:  Precautions Precautions: Fall Precaution/Restrictions Comments: L Hemiplegia Restrictions Weight Bearing Restrictions Per Provider Order: No    Therapy/Group: Individual Therapy  Jason Stevenson 11/26/2023, 3:38 PM

## 2023-11-26 NOTE — Progress Notes (Signed)
 Patient ID: Jason Stevenson, male   DOB: 02/18/1986, 38 y.o.   MRN: [id number] Met with the patient to review current medical situation, rehab process, team conference and plan of care. Discussed secondary risk management including DM (A1C 7.2) using a Libre system PTA on [employer] , HTN, HLD, previous CVA and heart transplant.  Patient expressed concern about left side weakness. Noted he had been awaiting HH rehab post stroke when health declined with seizures. Does have a PRAFO boot and resting hand splint.  Reviewed medications and dietary modifications including protein supplement and nutritional supplements. Continue to follow along to address educational needs to facilitate preparation for discharge. Fredericka Barnie NOVAK

## 2023-11-27 DIAGNOSIS — D638 Anemia in other chronic diseases classified elsewhere: Secondary | ICD-10-CM | POA: Diagnosis not present

## 2023-11-27 DIAGNOSIS — R7989 Other specified abnormal findings of blood chemistry: Secondary | ICD-10-CM

## 2023-11-27 DIAGNOSIS — E1169 Type 2 diabetes mellitus with other specified complication: Secondary | ICD-10-CM

## 2023-11-27 DIAGNOSIS — R5381 Other malaise: Secondary | ICD-10-CM | POA: Diagnosis not present

## 2023-11-27 LAB — CBC
HCT: 29.6 % — ABNORMAL LOW (ref 39.0–52.0)
Hemoglobin: 9.6 g/dL — ABNORMAL LOW (ref 13.0–17.0)
MCH: 28.4 pg (ref 26.0–34.0)
MCHC: 32.4 g/dL (ref 30.0–36.0)
MCV: 87.6 fL (ref 80.0–100.0)
Platelets: 312 K/uL (ref 150–400)
RBC: 3.38 MIL/uL — ABNORMAL LOW (ref 4.22–5.81)
RDW: 15.3 % (ref 11.5–15.5)
WBC: 4.3 K/uL (ref 4.0–10.5)
nRBC: 0 % (ref 0.0–0.2)

## 2023-11-27 LAB — BASIC METABOLIC PANEL WITH GFR
Anion gap: 9 (ref 5–15)
BUN: 18 mg/dL (ref 6–20)
CO2: 22 mmol/L (ref 22–32)
Calcium: 9.1 mg/dL (ref 8.9–10.3)
Chloride: 102 mmol/L (ref 98–111)
Creatinine, Ser: 1.01 mg/dL (ref 0.61–1.24)
GFR, Estimated: >60 mL/min (ref >60)
Glucose, Bld: 180 mg/dL — ABNORMAL HIGH (ref 70–99)
Potassium: 4.4 mmol/L (ref 3.5–5.1)
Sodium: 133 mmol/L — ABNORMAL LOW (ref 135–145)

## 2023-11-27 LAB — GLUCOSE, CAPILLARY
Glucose-Capillary: 177 mg/dL — ABNORMAL HIGH (ref 70–99)
Glucose-Capillary: 225 mg/dL — ABNORMAL HIGH (ref 70–99)
Glucose-Capillary: 225 mg/dL — ABNORMAL HIGH (ref 70–99)
Glucose-Capillary: 176 mg/dL — ABNORMAL HIGH (ref 70–99)

## 2023-11-27 LAB — MAGNESIUM: Magnesium: 1.7 mg/dL (ref 1.7–2.4)

## 2023-11-27 LAB — PHOSPHORUS: Phosphorus: 4.8 mg/dL — ABNORMAL HIGH (ref 2.5–4.6)

## 2023-11-27 MED ORDER — MAGNESIUM SULFATE 4 GM/100ML IV SOLN
4.0000 g | Freq: Once | INTRAVENOUS | Status: AC
  Administered 2023-11-27: 4 g via INTRAVENOUS
  Filled 2023-11-27: qty 100

## 2023-11-27 NOTE — Progress Notes (Addendum)
 PROGRESS NOTE   Subjective/Complaints: Working with therapy this morning in his room.  No new medical concerns or complaints noted. LBM yesterday  Review of systems denies headache, shortness of breath, abdominal pain, nausea, vomiting, diarrhea, constipation  Objective:   No results found.  Recent Labs    11/25/23 0443 11/27/23 0438  WBC 4.1 4.3  HGB 9.7* 9.6*  HCT 29.5* 29.6*  PLT 301 312   Recent Labs    11/26/23 1105 11/27/23 0438  NA 133* 133*  K 4.5 4.4  CL 102 102  CO2 22 22  GLUCOSE 212* 180*  BUN 17 18  CREATININE 1.01 1.01  CALCIUM  8.9 9.1    Intake/Output Summary (Last 24 hours) at 11/27/2023 1633 Last data filed at 11/27/2023 1428 Gross per 24 hour  Intake 480 ml  Output 1000 ml  Net -520 ml        Physical Exam: Vital Signs Blood pressure (!) 132/94, pulse 97, temperature (!) 97.5 F (36.4 C), resp. rate 18, height 5' 11 (1.803 m), weight 87.3 kg, SpO2 100%.  General: No acute distress, working with therapy in his room HEENT trach site well healed  Pleasant affect Heart: Regular rate and rhythm no rubs murmurs or extra sounds Lungs: Clear auscultation bilaterally, nonlabored breathing Abdomen: Soft nontender nondistended positive bowel sounds Extremities: No clubbing, cyanosis, or edema Skin: No breakdown noted on visible portion Neurologic: Cranial nerves II through XII intact, motor strength is 4/5 in right deltoid, bicep, tricep, grip, hip flexor, knee extensors, ankle dorsiflexor and plantar flexor LUE  2 -/5 left deltoid, bicep, tricep, finger flexors and trace extensors. Left lower extremity 3 - hip flexor 4 - knee extensor 0 ankle dorsiflexor and plantar flexor Sensory exam normal sensation to light touch is reduced on the left side below the knee as well as in the left upper extremity  Musculoskeletal: Full range of motion in all 4 extremities. No joint swelling      Assessment/Plan: 1. Functional deficits which require 3+ hours per day of interdisciplinary therapy in a comprehensive inpatient rehab setting. Physiatrist is providing close team supervision and 24 hour management of active medical problems listed below. Physiatrist and rehab team continue to assess barriers to discharge/monitor patient progress toward functional and medical goals  Care Tool:  Bathing    Body parts bathed by patient: Left lower leg, Right arm, Left arm, Chest, Abdomen, Front perineal area, Right upper leg, Left upper leg   Body parts bathed by helper: Buttocks, Left arm, Right arm, Left upper leg, Left lower leg     Bathing assist Assist Level: Moderate Assistance - Patient 50 - 74%     Upper Body Dressing/Undressing Upper body dressing   What is the patient wearing?: Pull over shirt    Upper body assist Assist Level: Moderate Assistance - Patient 50 - 74%    Lower Body Dressing/Undressing Lower body dressing      What is the patient wearing?: Pants, Underwear/pull up     Lower body assist Assist for lower body dressing: Maximal Assistance - Patient 25 - 49%     Toileting Toileting    Toileting assist Assist for toileting: Maximal Assistance -  Patient 25 - 49%     Transfers Chair/bed transfer  Transfers assist     Chair/bed transfer assist level: Total Assistance - Patient < 25%     Locomotion Ambulation   Ambulation assist      Assist level: Maximal Assistance - Patient 25 - 49% Assistive device: Other (comment) (R hallway HR) Max distance: 15 ft   Walk 10 feet activity   Assist     Assist level: Maximal Assistance - Patient 25 - 49% Assistive device: Other (comment) (R hallway HR)   Walk 50 feet activity   Assist Walk 50 feet with 2 turns activity did not occur: Safety/medical concerns         Walk 150 feet activity   Assist Walk 150 feet activity did not occur: Safety/medical concerns         Walk 10 feet  on uneven surface  activity   Assist Walk 10 feet on uneven surfaces activity did not occur: Safety/medical concerns         Wheelchair     Assist Is the patient using a wheelchair?: Yes Type of Wheelchair: Manual    Wheelchair assist level: Dependent - Patient 0% Max wheelchair distance: 250 ft    Wheelchair 50 feet with 2 turns activity    Assist        Assist Level: Dependent - Patient 0%   Wheelchair 150 feet activity     Assist      Assist Level: Dependent - Patient 0%   Blood pressure (!) 132/94, pulse 97, temperature (!) 97.5 F (36.4 C), resp. rate 18, height 5' 11 (1.803 m), weight 87.3 kg, SpO2 100%.  Medical Problem List and Plan: 1. Functional deficits secondary to debility following PEA arrest             -patient may  shower             -ELOS/Goals: 18-21d MinA  -Continue CIR  - Expected discharge 9/2 Team conference today please see physician documentation under team conference tab, met with team  to discuss problems,progress, and goals. Formulized individual treatment plan based on medical history, underlying problem and comorbidities.  2.  Antithrombotics: -DVT/anticoagulation:  Pharmaceutical: Lovenox              -antiplatelet therapy: N/A due to IPH 3. Pain Management: Tylenol  prn Been on Lyrica  at home for diabetic neuropathic pain. 4. Mood/Behavior/Sleep: LCSW to follow for evaluation and support.              -antipsychotic agents: N/A 5. Neuropsych/cognition: This patient is not capable of making decisions on his own behalf without input from [facility]. 6. Skin/Wound Care: Routine pressure relief measures. Monitor incision for healing.  7. Fluids/Electrolytes/Nutrition: Strict I/O. Renal diet with CM restrictions --no fluid restriction needed 8. PEA arrest X 2/CMV viremia: on Valcyte  900 mg daily thru 10/26 with monitoring of levels every 2 weeks post completion.  Pharmacy consult  9. Heart transplant: On Gengraf  BID with  prednisone  daily             --continue Coreg , lower dose Lipitor  and Zetia .  Bactrim  until 8/13 Heart Failure team will follow -appreciate assistance 10. Elevated phosphorous: Will keep on liberalized diet for food choices and to help with intake             --continue Phoslo  1334 tid ac--recheck level in am.  -8/14 Phos mildly elevated today, continue to monitor trend.  Continue PhosLo  11. Hx seizures: Has been stable  on Keppra  and Trileptal .  12. 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  Monitor BS ac/hs. Continue Insulin  glargine with novolog  tid ac  CBG (last 3)  Recent Labs    11/26/23 2103 11/27/23 0610 11/27/23 1150  GLUCAP 184* 177* 225*  8/14 CBGs trending up a little bit, continue to monitor trend for now.   13. Anemia of chronic disease: Monitor H/H for stability.   -8/14 Hemoglobin stable at 9.6   LOS: 3 days A FACE TO FACE EVALUATION WAS PERFORMED  Murray Collier 11/27/2023, 4:33 PM

## 2023-11-27 NOTE — Progress Notes (Signed)
 Occupational Therapy Session Note  Patient Details  Name: Jason Stevenson MRN: [id number] Date of Birth: 11-11-1985  Today's Date: 11/27/2023 OT Individual Time: 0800-0902 OT Individual Time Calculation (min): 62 min  And OT Individual Time: 8697-8655 OT Individual Time Calculation (min): 42 min   Short Term Goals: Week 1:  OT Short Term Goal 1 (Week 1): Pt wil recall hemi-dressing techniques with MIN questioning cues OT Short Term Goal 2 (Week 1): Pt will maintain L UE in safety position during ADL and functional transfers with MIN A OT Short Term Goal 3 (Week 1): Pt will complete LB dressing with MOD A OT Short Term Goal 4 (Week 1): Pt will complete 1/3 steps of toileting with MIN A  Skilled Therapeutic Interventions/Progress Updates:     AM Session:  Pt received sitting up in wc with nursing staff present in room. Pt presenting to be in good spirits receptive to skilled OT session reporting 0/10 pain- OT offering intermittent rest breaks, repositioning, and therapeutic support to optimize participation in therapy session. Pt requesting shower this AM as he had just had accident in bed d/t urgency to void. Focused this session on ADL retraining with emphasis on modified techniques, activity tolerance, sitting balance and L UE positioning.   Mobility:  Shower/wc transfer: Cookie IP for transfer to/from walk-in shower with TTB. Pt able to maintain perched sitting position with CGA overall, however her requires verbal cues to utilize self HOH A to stabilize L UE on STEDY grab bar. Pt requires MOD A for sit > stand from TTB d/t low seat. MIN A required for sit<>stands from wc +verbal cues for technique and body positioning.   ADL:  Bathing: U/LB bathing completed in seated position. Pt able to complete lateral leans using walls of shower and grab bars for balance while OT assisted with washing buttocks. Education provided on hemi-techniques and long handled sponge use with Pt able to  demonstrate teach back as evidence of learning- assistance required for washing lower B LEs to ensure cleanliness. Pt with slowed initiation and decreased sequencing during bathing requiring verbal cues. MOD A required overall for U/LB bathing tasks.  Dressing: When doffing OH shirt, MOD A and verbal cues required to utilize hemi-technique. Following shower, U/LB dressing completed sitting in WC using hemi-technique and education provided on utilizing reacher to weave B LEs into pants. OH shirt donned with MOD A overall and pants with MAX A d/t first trial using hemi-technique and reacher +MAX verbal cues. Pt able to maintain standing balance in STEDY while OT brought pants to waist with MIN A.   Pt was left resting in wc with call bell in reach, seatbelt alarm on, and all needs met.    PM Session:  Pt received semi-reclined in bed presenting to be in good spirits receptive to skilled OT session reporting 0/10 pain- OT offering intermittent rest breaks, repositioning, and therapeutic support to optimize participation in therapy session. Focused this session on AE education and functional transfer training. Pt easily distracted by internal/external stimuli during session requiring consistent verbal cues to attend to task. Slowed initiation and processing requiring significantly increased amount of time to complete tasks.   Pt with leg lifter in room, however unaware of how to utilize it during bed mobility. Provided education on technique for usage, however Pt continuing to require MOD A to bring L LE to EOB, d/t increased challenges implementing technique and d/t becoming distracted.   Engaged Pt in completing blocked practice of squat pivot transfers  from EOB <> wc with education and demonstration provided on technique. Emphasized importance of head/hip relationship, forward weight shifting, foot positioning, and hand placement. Multiple demonstrations required to learn technique. Pt was then able to  complete squat pivot transfers wc <> EOB x3 trials with MOD A required when transferring to R and MAX A when transferring to L +verbal and tactile cues for technique. Pt benefits from external visual cue of brining chin to OT's shoulder to improve anterior weight shifting. Increased practice opportunities and education recommended to increase Pt's independence and safety in completing transfers.   Pt was left resting in wc with call bell in reach, seatbelt alarm on, and all needs met.    Therapy Documentation Precautions:  Precautions Precautions: Fall Precaution/Restrictions Comments: L Hemiplegia Restrictions Weight Bearing Restrictions Per Provider Order: No  Therapy/Group: Individual Therapy  [name] 11/27/2023, 7:55 AM

## 2023-11-27 NOTE — Progress Notes (Addendum)
 Advanced Heart Failure Rounding Note  Cardiologist: Maude Emmer, MD   Chief Complaint: S/p OHT, assistance with immunosuppression  Subjective:    Has been able to complete therapies. Did several transfers and other activities yesterday.   Objective:   Weight Range: 87.3 kg Body mass index is 26.84 kg/m.   Vital Signs:   Temp:  [97.7 F (36.5 C)-98.6 F (37 C)] 97.7 F (36.5 C) (08/14 0624) Pulse Rate:  [88-100] 88 (08/14 0624) Resp:  [16-18] 18 (08/14 0624) BP: (110-138)/(82-103) 138/103 (08/14 0624) SpO2:  [99 %-100 %] 99 % (08/14 0624) Weight:  [87.3 kg] 87.3 kg (08/14 0500) Last BM Date : 11/26/23  Weight change: Filed Weights   11/25/23 0515 11/26/23 0612 11/27/23 0500  Weight: 82.3 kg 87.5 kg 87.3 kg   Intake/Output:   Intake/Output Summary (Last 24 hours) at 11/27/2023 1011 Last data filed at 11/27/2023 0755 Gross per 24 hour  Intake 236 ml  Output 800 ml  Net -564 ml     Physical Exam  General:  chronically ill appearing.  No respiratory difficulty Neck: JVD flat. Healing tach site Cor: Regular rate & rhythm. No murmurs. Lungs: clear Extremities: no edema. L sided weakness Neuro: alert & oriented x 3. Affect pleasant.   Labs    CBC Recent Labs    11/25/23 0443 11/27/23 0438  WBC 4.1 4.3  NEUTROABS 1.2*  --   HGB 9.7* 9.6*  HCT 29.5* 29.6*  MCV 88.1 87.6  PLT 301 312   Basic Metabolic Panel Recent Labs    91/86/74 0515 11/26/23 1105 11/27/23 0438  NA  --  133* 133*  K  --  4.5 4.4  CL  --  102 102  CO2  --  22 22  GLUCOSE  --  212* 180*  BUN  --  17 18  CREATININE  --  1.01 1.01  CALCIUM   --  8.9 9.1  MG 1.7  --  1.7  PHOS 4.3  --  4.8*   Liver Function Tests Recent Labs    11/25/23 0443  AST 20  ALT 18  ALKPHOS 66  BILITOT 0.6  PROT 6.5  ALBUMIN 2.6*   No results for input(s): LIPASE, AMYLASE in the last 72 hours. Cardiac Enzymes No results for input(s): CKTOTAL, CKMB, CKMBINDEX, TROPONINI in the  last 72 hours.  BNP: BNP (last 3 results) No results for input(s): BNP in the last 8760 hours.  ProBNP (last 3 results) No results for input(s): PROBNP in the last 8760 hours.   D-Dimer No results for input(s): DDIMER in the last 72 hours. Hemoglobin A1C Recent Labs    11/24/23 1537  HGBA1C 7.2*   Fasting Lipid Panel No results for input(s): CHOL, HDL, LDLCALC, TRIG, CHOLHDL, LDLDIRECT in the last 72 hours. Thyroid  Function Tests No results for input(s): TSH, T4TOTAL, T3FREE, THYROIDAB in the last 72 hours.  Invalid input(s): FREET3  Other results:   Imaging    No results found.    Medications:     Scheduled Medications:  atorvastatin   10 mg Oral Q supper   budesonide  (PULMICORT ) nebulizer solution  0.25 mg Nebulization BID   calcium  acetate  1,334 mg Oral TID WC   carvedilol   12.5 mg Oral BID WC   cycloSPORINE  modified  125 mg Oral BID   ezetimibe   10 mg Oral Daily   heparin  injection (subcutaneous)  5,000 Units Subcutaneous Q8H   insulin  aspart  0-5 Units Subcutaneous QHS   insulin  aspart  0-9 Units Subcutaneous TID WC   insulin  aspart  5 Units Subcutaneous TID WC   insulin  glargine-yfgn  26 Units Subcutaneous Q2200   levETIRAcetam   1,500 mg Oral BID   lidocaine   2 patch Transdermal Q24H   melatonin  3 mg Oral QHS   OXcarbazepine   300 mg Oral Daily   OXcarbazepine   600 mg Oral QHS   pantoprazole   40 mg Oral Daily   predniSONE   5 mg Oral Q breakfast   pregabalin   75 mg Oral QHS   Ensure Max Protein  11 oz Oral BID   protein supplement  1 Scoop Oral TID WC   traZODone   50 mg Oral QHS   valGANciclovir   900 mg Oral Daily    Infusions:  magnesium  sulfate bolus IVPB 4 g (11/27/23 0921)    PRN Medications: acetaminophen , alum & mag hydroxide-simeth, bisacodyl , diclofenac  Sodium, diphenhydrAMINE , guaiFENesin -dextromethorphan, ipratropium-albuterol , prochlorperazine  **OR** prochlorperazine  **OR** prochlorperazine ,  senna-docusate, sodium phosphate    Patient Profile   38 y.o. 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   Transferred to CIR 11/24/23 after prolonged hospitalization at [facility] w/ CMV viremia c/b PEA arrest, subsequent [facility] ECMO cannulation >>VV ECMO and eventual trach.  Assessment/Plan  HFrEF d/t NICM s/p OHT 8/24 - s/p OHT 11/26/22. Last EMBx in 3/25 negative with normal EF.  - TTE pre-arrest with EF 40% and severely reduced RV function - s/p PEA arrest (see below) - RHC 10/15/23 with normal CI and elevated PCWP. EMBx taken 7/2 - TTE 7/29 with normal biV function.  - euvolemic on exam - Dr. Richardson Dubree discussed immunosuppression with [facility], Dr. Devore. Continue cyclosporine  125 mg bid. CyA goal 150-200 (346 on 08/11). Repeat CyA trough level today.  - he is now off MMF - continue bactrim  (end 11/26/23) - continue prednisone  5 mg daily - continue atorva 10 mg at bedtime + zetia  10 mg daily - continue coreg  12.5 mg bid. May need to add additional antihypertensive agent. Considering ARB. Will recheck BMET now that he is off bactrim  (ensure no hyperkalemia). Stable.               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 - induced with ganciclovir - continue valganciclovir  900 mg daily (plan for 3 months)   PEA arrest on 10/01/23 - s/p [facility] ECLS transitioned to VV ECMO 6/21. De-cannulated 6/25. Trach placed.   - presumed respiratory etiology, possibly d/t unwitnessed seizure - ESBL Kleb HCAP s/p meropenem  - trach de-cannulated 8/4   H/o CVA - stroke with hemorrhagic conversion in 2022 and again in 4/25 - residual L sided deficits and seizures - continue keppra  and trileptal      Length of Stay: 3  Beckey LITTIE Coe, NP  11/27/2023, 10:11 AM  Advanced Heart Failure Team Pager [phone] (M-F; 7a - 5p)  Please contact  [facility] for night-coverage after hours (5p -7a ) and weekends on [email or web address]  Patient seen and examined with the above-signed Advanced Practice Provider and/or Housestaff. I personally reviewed laboratory data, imaging studies and relevant notes. I independently examined the patient and formulated the important aspects of the plan. I have edited the note to reflect any of my changes or salient points. I have personally discussed the plan with the patient and/or family.  Continues to work with rehab. No CP or SOB  CyA level  drawn today  General:  Sitting up No resp difficulty HEENT: normal Neck: supple. no JVD. + healing trach site Cor: PMI nondisplaced. Regular rate & rhythm. No rubs, gallops or murmurs. Lungs: clear Abdomen: soft, nontender, nondistended. No hepatosplenomegaly. No bruits or masses. Good bowel sounds. Extremities: no cyanosis, clubbing, rash, edema Neuro: alert & orientedx3, + L sided weakness  Doing well with rehab. Stable from cardiac standpoint. CyA trough done today. Continue valganciclovir .   We will follow intermittently. Please call as needed.   Toribio Fuel, MD  10:45 AM

## 2023-11-27 NOTE — Progress Notes (Signed)
 Speech Language Pathology Daily Session Note  Patient Details  Name: Jason Stevenson MRN: [id number] Date of Birth: 12-09-1985  Today's Date: 11/27/2023 SLP Individual Time: 1531-1600 SLP Individual Time Calculation (min): 29 min  Short Term Goals: Week 1: SLP Short Term Goal 1 (Week 1): Patient will increase vocal intensity during conversation to reach 100% intelligibility given min verbal A SLP Short Term Goal 2 (Week 1): Patient will recall and utilize memory strategies given min multimodal A SLP Short Term Goal 3 (Week 1): Patient will demonstrate sustained attention to functional tasks given min multimodal A SLP Short Term Goal 4 (Week 1): Patient will demonstrate mildly complex problem solving skills given min multimodal A  Skilled Therapeutic Interventions: Skilled therapy session focused on communication and cognitive goals. SLP facilitated session by prompting identification of medication mistakes. Given BID pillbox and written instructions, patient identified and corrected mistakes given minA and extra processing time. SLP targeted communication goals through use of EMST (expiratory muscle strength training) to increase vocal intensity. Patient completed x25 repetitions set at 30cm H2O. Patient left in [facility] with alarm set and call bell in reach. Continue POC.  Pain None reported to SL  Therapy/Group: Individual Therapy  Mayur Duman M.A., CCC-SLP 11/27/2023, 6:46 AM

## 2023-11-27 NOTE — IPOC Note (Signed)
 Overall Plan of Care [facility]) Patient Details Name: Jason Stevenson MRN: [id number] DOB: Sep 29, 1985  Admitting Diagnosis: Debility  Hospital Problems: Principal Problem:   Debility Active Problems:   Anemia of chronic disease   Serum phosphate elevated     Functional Problem List: Nursing Bladder, Safety, Bowel, Endurance, Medication Management, Pain, Nutrition  PT Balance, Endurance, Motor, Perception, Safety, Sensory  OT Balance, Perception, Safety, Cognition, Sensory, Skin Integrity, Endurance, Motor  SLP Cognition, Linguistic  TR         Basic ADL's: OT Eating, Grooming, Bathing, Dressing, Toileting     Advanced  ADL's: OT       Transfers: PT Bed Mobility, Bed to Chair, Car, Occupational psychologist, Research scientist (life sciences): PT Ambulation, Psychologist, prison and probation services, Stairs     Additional Impairments: OT Fuctional Use of Upper Extremity  SLP Social Cognition   Problem Solving, Memory, Attention  TR      Anticipated Outcomes Item Anticipated Outcome  Self Feeding Set-up A  Swallowing      Basic self-care  Min A  Toileting  MIN A   Bathroom Transfers MIN A  Bowel/Bladder  manage bowel w mod I assist and bladder w toileting  Transfers  CGA  Locomotion  MinA for ambulation; supervision for w/c mobility  Communication  supervision  Cognition  supervision  Pain  Pain < 4 with prns  Safety/Judgment  manage safety w cues   Therapy Plan: PT Intensity: Minimum of 1-2 x/day ,45 to 90 minutes PT Frequency: 5 out of 7 days PT Duration Estimated Length of Stay: 21-24 days OT Intensity: Minimum of 1-2 x/day, 45 to 90 minutes OT Frequency: 5 out of 7 days OT Duration/Estimated Length of Stay: 3 weeks SLP Intensity: Minumum of 1-2 x/day, 30 to 90 minutes SLP Frequency: 1 to 3 out of 7 days SLP Duration/Estimated Length of Stay: 18-21 days   Team Interventions: Nursing Interventions Patient/Family Education, Medication Management, Bladder Management, Bowel  Management, Disease Management/Prevention, Pain Management, Discharge Planning, Dysphagia/Aspiration Precaution Training  PT interventions Ambulation/gait training, Cognitive remediation/compensation, Discharge planning, DME/adaptive equipment instruction, Functional mobility training, Pain management, Psychosocial support, Splinting/orthotics, Therapeutic Activities, UE/LE Strength taining/ROM, Visual/perceptual remediation/compensation, Wheelchair propulsion/positioning, UE/LE Coordination activities, Therapeutic Exercise, Skin care/wound management, Stair training, Patient/family education, Neuromuscular re-education, Functional electrical stimulation, Disease management/prevention, Firefighter, Warden/ranger  OT Interventions Warden/ranger, Discharge planning, Functional electrical stimulation, Pain management, Self Care/advanced ADL retraining, Therapeutic Activities, UE/LE Coordination activities, Cognitive remediation/compensation, Disease mangement/prevention, Functional mobility training, Patient/family education, Skin care/wound managment, Therapeutic Exercise, Visual/perceptual remediation/compensation, Firefighter, Fish farm manager, Neuromuscular re-education, Psychosocial support, Splinting/orthotics, UE/LE Strength taining/ROM, Wheelchair propulsion/positioning  SLP Interventions Cognitive remediation/compensation, Cueing hierarchy, Functional tasks, Internal/external aids, Multimodal communication approach, Speech/Language facilitation, Therapeutic Activities  TR Interventions    SW/CM Interventions Discharge Planning, Psychosocial Support, Patient/Family Education   Barriers to Discharge MD  Medical stability  Nursing Decreased caregiver support, Home environment access/layout 2 level main B+B w spouse who works; family to assist with care.  PT Decreased caregiver support, Other (comments), Lack of/limited family support  (reduced motor control)    OT None    SLP      SW Insurance for SNF coverage     Team Discharge Planning: Destination: PT-Home ,OT- Home , SLP-Home Projected Follow-up: PT-Home health PT, 24 hour supervision/assistance, OT-  Home health OT, SLP- (TBD) Projected Equipment Needs: PT-To be determined, OT- To be determined, SLP-None recommended by SLP Equipment Details: PT- , OT-  Patient/family involved in discharge  planning: PT- Patient,  OT-Patient, SLP-Patient, Family member/caregiver  MD ELOS: 3 weeks Medical Rehab Prognosis:  Good Assessment: The patient has been admitted for CIR therapies with the diagnosis of debility following PEA arrest . The team will be addressing functional mobility, strength, stamina, balance, safety, adaptive techniques and equipment, self-care, bowel and bladder mgt, patient and caregiver education. Goals have been set at [facility]. Anticipated discharge destination is home.        See Team Conference Notes for weekly updates to the plan of care

## 2023-11-27 NOTE — Progress Notes (Signed)
 Physical Therapy Session Note  Patient Details  Name: Jason Stevenson MRN: [id number] Date of Birth: October 23, 1985  Today's Date: 11/27/2023 PT Individual Time: 1008-1050; 1502 - 1530 PT Individual Time Calculation (min): 42 min; 28 min   Short Term Goals: Week 1:  PT Short Term Goal 1 (Week 1): Pt willperform bed mobility with consistent ModA overall. PT Short Term Goal 2 (Week 1): Pt will perform sit<>stand transfers with no AD and modA. PT Short Term Goal 3 (Week 1): Pt will perform squat pivot transfers with MinA. PT Short Term Goal 4 (Week 1): Pt will perform stand pivot transfers with ModA +1. PT Short Term Goal 5 (Week 1): Pt will perform ambulation with MaxA +1 using RW for at least 25 ft.  SESSION 1 Skilled Therapeutic Interventions/Progress Updates: Patient sitting in WC on entrance to room. Patient alert and agreeable to PT session.   Patient reported unrated pain in R UE (shoulder blade). PTA provided manual therapy/therex to assist with pt reporting decrease in pain following.  Neuromuscular Re-ed: NMR facilitated during session with focus on quadriceps/glute control/activation in closed chain position. - Pt min/light modA to stand safely, and mod/maxA to maintain standing balance. Pt performed static stance with R UE supported with + 2 for safety and PTA on L providing knee block with tactile feedback to quadriceps belly (also cues for pt to engage glutes throughout).   NMR performed for improvements in motor control and coordination, balance, sequencing, judgement, and self confidence/ efficacy in performing all aspects of mobility at highest level of independence.   Therapeutic Exercise: Pt performed the following exercises with therapist providing the described cuing and facilitation for improvement. - R Lat scapular retractor stretch with R UE in elbow flexion - ROM of R UE scapular retraction/protraction with multimodal cues for mechanics (following manual therapy) -  Scapular retraction with green theraband in R UE with multimodal cues for mechanics (avoiding scapular elevator activation while maintaining elbow in extension)  Manual Therapy: Palpation of R rhomboids/upper trap musculature performed with trigger points noted. Education and rationale provided with pt agreeing to participate in intervention. - Trigger point release to stated area with soft tissue mobilization to follow throughout.  Patient sitting in WC at end of session with brakes locked, belt alarm set, and all needs within reach.  SESSION 2 Skilled Therapeutic Interventions/Progress Updates: Patient supine in bed with wife present on entrance to room. Patient alert and agreeable to PT session.   Patient reported that manual therapy + therex previous session carried over to the afternoon with less pain.  Therapeutic Activity: Bed Mobility: Pt performed supine<sit on EOB with modA (HOB elevated). VC required for sequence and use of R UE to assist with truncal elevation. Transfers: Pt performed squat pivot transfer from EOB<WC (to the R) with multimodal cues for head/hip relationship. Pt performed with mod/heavy modA  Gait Training:  Pt ambulated 35' x 2 using RHR (seated rest break provided) with overall mod/maxA Pt demonstrated the following gait deviations with therapist providing the described cuing and facilitation for improvement:  - DF wrap donned L LE - Pt with cues to increase hip extension throughout - Pt cued to increase step length on R LE with heel strike to follow - Pt required maxA to to advance L LE through gait cycle with tactile cues on quadriceps bellow during stance phase with pt showing signs of improvement as steps progressed. Pt also noted to have few moments of maintaining soft bend in L knee during  stance  Patient sitting in WC at end of session with brakes locked, hand off to SLP, wife present, and all needs in reach.       Therapy Documentation Precautions:   Precautions Precautions: Fall Precaution/Restrictions Comments: L Hemiplegia Restrictions Weight Bearing Restrictions Per Provider Order: No  Therapy/Group: Individual Therapy  Brighton Pilley PTA 11/27/2023, 12:41 PM

## 2023-11-28 ENCOUNTER — Telehealth: Payer: Self-pay

## 2023-11-28 DIAGNOSIS — R5381 Other malaise: Secondary | ICD-10-CM | POA: Diagnosis not present

## 2023-11-28 DIAGNOSIS — R7989 Other specified abnormal findings of blood chemistry: Secondary | ICD-10-CM | POA: Diagnosis not present

## 2023-11-28 DIAGNOSIS — I69119 Unspecified symptoms and signs involving cognitive functions following nontraumatic intracerebral hemorrhage: Secondary | ICD-10-CM

## 2023-11-28 DIAGNOSIS — E1142 Type 2 diabetes mellitus with diabetic polyneuropathy: Secondary | ICD-10-CM | POA: Diagnosis not present

## 2023-11-28 DIAGNOSIS — R200 Anesthesia of skin: Secondary | ICD-10-CM

## 2023-11-28 DIAGNOSIS — Z941 Heart transplant status: Secondary | ICD-10-CM | POA: Diagnosis not present

## 2023-11-28 LAB — BASIC METABOLIC PANEL WITH GFR
Anion gap: 10 (ref 5–15)
BUN: 17 mg/dL (ref 6–20)
CO2: 23 mmol/L (ref 22–32)
Calcium: 9.3 mg/dL (ref 8.9–10.3)
Chloride: 104 mmol/L (ref 98–111)
Creatinine, Ser: 1.03 mg/dL (ref 0.61–1.24)
GFR, Estimated: >60 mL/min (ref >60)
Glucose, Bld: 188 mg/dL — ABNORMAL HIGH (ref 70–99)
Potassium: 4.3 mmol/L (ref 3.5–5.1)
Sodium: 137 mmol/L (ref 135–145)

## 2023-11-28 LAB — GLUCOSE, CAPILLARY
Glucose-Capillary: 206 mg/dL — ABNORMAL HIGH (ref 70–99)
Glucose-Capillary: 146 mg/dL — ABNORMAL HIGH (ref 70–99)
Glucose-Capillary: 197 mg/dL — ABNORMAL HIGH (ref 70–99)
Glucose-Capillary: 216 mg/dL — ABNORMAL HIGH (ref 70–99)

## 2023-11-28 LAB — CYCLOSPORINE: Cyclosporine, LabCorp: 274 ng/mL (ref 100–400)

## 2023-11-28 MED ORDER — INSULIN GLARGINE-YFGN 100 UNIT/ML ~~LOC~~ SOLN
28.0000 [IU] | Freq: Every day | SUBCUTANEOUS | Status: DC
  Administered 2023-11-28: 28 [IU] via SUBCUTANEOUS
  Filled 2023-11-28 (×2): qty 0.28

## 2023-11-28 NOTE — Progress Notes (Signed)
 Physical Therapy Session Note  Patient Details  Name: Jason Stevenson MRN: [id number] Date of Birth: [birth year]  Today's Date: 11/28/2023 PT Individual Time: 1351-1450 PT Individual Time Calculation (min): 59 min   Short Term Goals: Week 1:  PT Short Term Goal 1 (Week 1): Pt willperform bed mobility with consistent ModA overall. PT Short Term Goal 2 (Week 1): Pt will perform sit<>stand transfers with no AD and modA. PT Short Term Goal 3 (Week 1): Pt will perform squat pivot transfers with MinA. PT Short Term Goal 4 (Week 1): Pt will perform stand pivot transfers with ModA +1. PT Short Term Goal 5 (Week 1): Pt will perform ambulation with MaxA +1 using RW for at least 25 ft.  Skilled Therapeutic Interventions/Progress Updates:  Patient supine in bed on entrance to room. Patient alert and agreeable to PT session. D/t R brain lesion, pt does require extra time to complete each task d/t reduced focus on task at hand.   Patient with no pain complaint at start of session.  Therapeutic Activity: Bed Mobility: Pt performed supine > sit with MinA for LLE off bed surface. MinA for bringing trunk upright. VC/ tc required for focus on technique. Squares self to EOB with vc and then is able to laterally scoot to R to setup for squat pivot transfer to w/c on R side. Heavy ModA to complete.  Transfers: Pt performed sit<>stand transfers throughout session with use of R hallway HR and cues required for setup and technique. Is able to initially hold balance with close supervision but motor impersistence and fatigue to L hemibody causes mild LOB with slow L knee flexion requiring Min/ ModA to correct.   Gait Training:  Gait training initiated at R hallway HR for 20 ft requiring Mod/ MaxA overall with close w/c follow. Pt demos ability to initiate LLE advancement but requires physical assist for more abducted placement and neutral rotation. Requires close guard at first and with fatigue requires block during  stance phase. Extra time to initiate RLE advancement despite vc to initiate. Difficulty with planning and execution. However, is able to correct significant forward lean of L trunk with RLE advancement for more upright trunk composure. Provided vc/ tc throughout for sequencing, initiation, focus, motor control.  Attempted continued gait training after seated rest break but using +2 for RUE support instead of hallway HR. Pt with significant decrease in motor planning, initiation, and execution. Poor quality of gait and stopped gait training.   NMR performed for improvements in motor control and coordination, balance, sequencing, judgement, and self confidence/ efficacy in performing all aspects of mobility at highest level of independence.   Patient seated upright in w/c at end of session with brakes locked, belt alarm set, and all needs within reach.   Therapy Documentation Precautions:  Precautions Precautions: Fall Precaution/Restrictions Comments: L Hemiplegia Restrictions Weight Bearing Restrictions Per Provider Order: No  Pain:  No pain related this session.   Therapy/Group: Individual Therapy  [name] PT, DPT, CSRS 11/28/2023, 7:09 PM

## 2023-11-28 NOTE — Progress Notes (Signed)
 PROGRESS NOTE   Subjective/Complaints: Patient reports some chronic tingling in his right hand digits 1 and 2 mostly on the plantar side.  Called [facility] heart transplant, cyclosporine  level added for Monday.  Also was notified by [name] that [employer] was contacted about his pacemaker and working correctly.  Review of systems denies chills, headache, shortness of breath, abdominal pain, nausea, vomiting, diarrhea, constipation  Objective:   No results found.  Recent Labs    11/27/23 0438  WBC 4.3  HGB 9.6*  HCT 29.6*  PLT 312   Recent Labs    11/27/23 0438 11/28/23 0558  NA 133* 137  K 4.4 4.3  CL 102 104  CO2 22 23  GLUCOSE 180* 188*  BUN 18 17  CREATININE 1.01 1.03  CALCIUM  9.1 9.3    Intake/Output Summary (Last 24 hours) at 11/28/2023 1641 Last data filed at 11/28/2023 1340 Gross per 24 hour  Intake 712 ml  Output 875 ml  Net -163 ml        Physical Exam: Vital Signs Blood pressure (!) 138/93, pulse 97, temperature 97.9 F (36.6 C), resp. rate 16, height 5' 11 (1.803 m), weight 87.3 kg, SpO2 100%.  General: No acute distress, working with therapy in his room HEENT trach site well healed  Pleasant affect Heart: Regular rate and rhythm no rubs murmurs or extra sounds Lungs: Clear auscultation bilaterally, nonlabored breathing Abdomen: Soft nontender nondistended positive bowel sounds Extremities: No clubbing, cyanosis, or edema Skin: No breakdown noted on visible portion Neurologic: Cranial nerves II through XII intact, motor strength is 4/5 in right deltoid, bicep, tricep, grip, hip flexor, knee extensors, ankle dorsiflexor and plantar flexor LUE  2 -/5 left deltoid, bicep, tricep, finger flexors and trace extensors. Left lower extremity 3 - hip flexor 4 - knee extensor 0 ankle dorsiflexor and plantar flexor Sensory exam normal sensation to light touch is reduced on the left side  below the knee as well as in the left upper extremity  Altered sensation plantar 1st and 2nd digit right hand, Tinel's mildly positive right wrist Musculoskeletal: Full range of motion in all 4 extremities. No joint swelling     Assessment/Plan: 1. Functional deficits which require 3+ hours per day of interdisciplinary therapy in a comprehensive inpatient rehab setting. Physiatrist is providing close team supervision and 24 hour management of active medical problems listed below. Physiatrist and rehab team continue to assess barriers to discharge/monitor patient progress toward functional and medical goals  Care Tool:  Bathing    Body parts bathed by patient: Left lower leg, Right arm, Left arm, Chest, Abdomen, Front perineal area, Right upper leg, Left upper leg   Body parts bathed by helper: Buttocks, Left arm, Right arm, Left upper leg, Left lower leg     Bathing assist Assist Level: Moderate Assistance - Patient 50 - 74%     Upper Body Dressing/Undressing Upper body dressing   What is the patient wearing?: Pull over shirt    Upper body assist Assist Level: Moderate Assistance - Patient 50 - 74%    Lower Body Dressing/Undressing Lower body dressing      What is the patient wearing?: Pants,  Underwear/pull up     Lower body assist Assist for lower body dressing: Maximal Assistance - Patient 25 - 49%     Toileting Toileting    Toileting assist Assist for toileting: Maximal Assistance - Patient 25 - 49%     Transfers Chair/bed transfer  Transfers assist     Chair/bed transfer assist level: Total Assistance - Patient < 25%     Locomotion Ambulation   Ambulation assist      Assist level: Maximal Assistance - Patient 25 - 49% Assistive device: Other (comment) (R hallway HR) Max distance: 15 ft   Walk 10 feet activity   Assist     Assist level: Maximal Assistance - Patient 25 - 49% Assistive device: Other (comment) (R hallway HR)   Walk 50 feet  activity   Assist Walk 50 feet with 2 turns activity did not occur: Safety/medical concerns         Walk 150 feet activity   Assist Walk 150 feet activity did not occur: Safety/medical concerns         Walk 10 feet on uneven surface  activity   Assist Walk 10 feet on uneven surfaces activity did not occur: Safety/medical concerns         Wheelchair     Assist Is the patient using a wheelchair?: Yes Type of Wheelchair: Manual    Wheelchair assist level: Dependent - Patient 0% Max wheelchair distance: 250 ft    Wheelchair 50 feet with 2 turns activity    Assist        Assist Level: Dependent - Patient 0%   Wheelchair 150 feet activity     Assist      Assist Level: Dependent - Patient 0%   Blood pressure (!) 138/93, pulse 97, temperature 97.9 F (36.6 C), resp. rate 16, height 5' 11 (1.803 m), weight 87.3 kg, SpO2 100%.  Medical Problem List and Plan: 1. Functional deficits secondary to debility following PEA arrest             -patient may  shower             -ELOS/Goals: 18-21d MinA  -Continue CIR  - Expected discharge 9/2 Team conference today please see physician documentation under team conference tab, met with team  to discuss problems,progress, and goals. Formulized individual treatment plan based on medical history, underlying problem and comorbidities.  2.  Antithrombotics: -DVT/anticoagulation:  Pharmaceutical: Lovenox              -antiplatelet therapy: N/A due to IPH 3. Pain Management: Tylenol  prn Been on Lyrica  at home for diabetic neuropathic pain. 4. Mood/Behavior/Sleep: LCSW to follow for evaluation and support.              -antipsychotic agents: N/A 5. Neuropsych/cognition: This patient is not capable of making decisions on his own behalf without input from [facility].  - Patient was seen by neuropsychology, appreciate insight 6. Skin/Wound Care: Routine pressure relief measures. Monitor incision for healing.  7.  Fluids/Electrolytes/Nutrition: Strict I/O. Renal diet with CM restrictions --no fluid restriction needed 8. PEA arrest X 2/CMV viremia: on Valcyte  900 mg daily thru 10/26 with monitoring of levels every 2 weeks post completion.  Pharmacy consult  9. Heart transplant: On Gengraf  BID with prednisone  daily             --continue Coreg , lower dose Lipitor  and Zetia .  Bactrim  until 8/13 Heart Failure team will follow -appreciate assistance -Called [facility] heart transplant, cyclosporine  level added  for Monday.  -[name] discussed [employer] was contacted about his pacemaker and working correctly. 10. Elevated phosphorous: Will keep on liberalized diet for food choices and to help with intake             --continue Phoslo  1334 tid ac--recheck level in am.  -8/14 Phos mildly elevated today, continue to monitor trend.  Continue PhosLo   Recheck levels 11. Hx seizures: Has been stable on Keppra  and Trileptal .  12. 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  Monitor BS ac/hs. Continue Insulin  glargine with novolog  tid ac  CBG (last 3)  Recent Labs    11/27/23 2117 11/28/23 0614 11/28/23 1146  GLUCAP 176* 206* 146*  8/12 will increase Lantus  from 26 units to 28 units for better control 13. Anemia of chronic disease: Monitor H/H for stability.   -8/14 Hemoglobin stable at 9.6 14. R hand numbness: Possible carpal tunnel  - Start carpal tunnel wrist splint use at night  LOS: 4 days A FACE TO FACE EVALUATION WAS PERFORMED  Murray Collier 11/28/2023, 4:41 PM

## 2023-11-28 NOTE — Progress Notes (Signed)
 Orthopedic Tech Progress Note Patient Details:  Jason Stevenson 31-Mar-1986 [id number]  Ortho Devices Type of Ortho Device: Thumb velcro splint Ortho Device/Splint Location: RUE Ortho Device/Splint Interventions: Ordered, Application   Post Interventions Patient Tolerated: Well Instructions Provided: Care of device   Jason Stevenson L Jason Stevenson 11/28/2023, 7:42 PM

## 2023-11-28 NOTE — Progress Notes (Signed)
 Speech Language Pathology Daily Session Note  Patient Details  Name: Jason Stevenson MRN: [id number] Date of Birth: 05-Jan-1986  Today's Date: 11/28/2023 SLP Individual Time: 8883-8840 SLP Individual Time Calculation (min): 43 min  Short Term Goals: Week 1: SLP Short Term Goal 1 (Week 1): Patient will increase vocal intensity during conversation to reach 100% intelligibility given min verbal A SLP Short Term Goal 2 (Week 1): Patient will recall and utilize memory strategies given min multimodal A SLP Short Term Goal 3 (Week 1): Patient will demonstrate sustained attention to functional tasks given min multimodal A SLP Short Term Goal 4 (Week 1): Patient will demonstrate mildly complex problem solving skills given min multimodal A  Skilled Therapeutic Interventions: Skilled therapy session focused on cognitive and communicative goals. SLP facilitated completion of medication management task to encourage use of problem solving, memory and organizational skills. SLP  provided supervisioinA for patient to complete BID pillbox according to written and verbalized directions. Patient with 2 mistakes, though able to self correct with min cues. SLP educate patient on continuing to utilize pillbox upon d/c to aid in planning, organization, and memory. Patient verbalized understanding. SLP continued to challenge patient through identification of medication mistakes task. Patient required minA to identify errors in BID pillbox and correct according to directions. At the end of the session, patient completed x25 repetitions of EMST at 30cm H2O. Patient left in chair with alarm set and call bell in reach. Continue POC.   Pain None reported   Therapy/Group: Individual Therapy  Raiyan Dalesandro M.A., CCC-SLP 11/28/2023, 7:41 AM

## 2023-11-28 NOTE — Consult Note (Signed)
 Neuropsychological Consultation Comprehensive Inpatient Rehab   Patient:   Jason Stevenson   DOB:   08/14/1985  MR Number:  [id number]  Location:  [facility]  [facility] [address] CENTER A [address] [place] [address] Dept: [phone] Loc: [phone]           Date of Service:   11/28/2023  Start Time:   10 AM End Time:   11 AM  Provider/Observer:  Norleen Asa, Psy.D.       Clinical Neuropsychologist       Billing Code/Service: [phone]  Reason for Service:    Jason Stevenson is a 38 year old male referred for neuropsychological consultation due to ongoing cognitive difficulties and functional deficits during admission to the comprehensive inpatient rehabilitation unit (CIRU).  History of Present Illness: Admitted to CIRU for intensive rehabilitation following a complex medical course. 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 Subsequent hospital course was complicated by sepsis, requiring admission to [facility] ED on 09/30/2023 for hypotension, diarrhea, weakness, and lethargy, followed by transfer to [facility]. During that admission, experienced a respiratory arrest, possibly seizure-related, necessitating intubation and eventual tracheostomy (decannulated 11/17/2023). Was discharged home after the initial CVA but experienced progressive weakness and debility, described as failure to thrive, leading to the current admission for intensive rehabilitation.  Past Medical History: - Non-ischemic cardiomyopathy s/p heart transplant (11/26/2022 at [facility]). - Hypertension. - Type II Diabetes Mellitus. 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 - MRI from 09/21/2020 noted pre-existing cerebrovascular vulnerability.  Interview and Behavioral Observations: Engaged in consultation at bedside. Initially appeared weak but was  cooperative and able to engage in conversation. Reports poor recall of recent medical events, particularly the period of acute illness and transfer to [facility]. Remembers the events leading up to the CVA in [place], where he was attending an event with Jason Stevenson. Expresses confusion about the etiology of his stroke and with hemorrhagic transformation, seeking to understand preventative measures. He is oriented to year (2025) and month (August). He is aware he is in a hospital but has been in several recently.  Cognitively, he is beginning to feel clearer and stronger. He acknowledges the goal of rehabilitation is to build strength to return home. His wife, [name], was present via phone for part of the session. She also sought clarification on the plan of care and prognosis.  Discussion focused on providing psychoeducation regarding his medical and neurological course. Explained the nature of a hemorrhagic stroke and the delicate balance of managing anticoagulation post-heart transplant, which increases bleed risk while preventing clots. Clarified the role of the right basal ganglia and frontal lobe in motor control for the left side of the body and in processing complex information (the whole picture), contributing to his left-sided weakness and initial confusion. Reassured that cognitive and functional improvements are expected as the brain reabsorbs the blood from the hemorrhage, evidenced by his progress from last week to this week. Explained the role of the physiatrist (Dr. Abigail) and the multidisciplinary rehab team (PT, OT, nursing) in his recovery. The concept of Care Everywhere was explained, ensuring his care team at [facility] can see his progress here.  Impression/DX: Avedis Bevis is a 38 year old male s/p heart transplant and recent hemorrhagic CVA, now admitted for intensive rehabilitation. He is coping with a prolonged and complex hospitalization. Cognitively, he demonstrates improving  orientation and insight, though with significant gaps  in memory for acute events. Mood appears appropriate, and he is motivated for rehabilitation. He and his wife benefited from psychoeducation regarding his neurological condition and the goals of his rehabilitation stay.  Plan: 1. Will continue to monitor cognitive and emotional adjustment to hospitalization and rehabilitation. 2. Follow-up as needed to provide support and assess cognitive recovery. 3. Reinforced the role of the primary team (Dr. Carilyn, PA Holley) in developing and communicating the master treatment plan, which is formally reviewed on Wednesdays. 4. Advised patient and wife that a more detailed discussion about the comprehensive plan would likely occur with the primary team following the upcoming team conference. 5. Encouraged continued active participation in all therapies.         Electronically Signed   _______________________ Norleen Asa, Psy.D. Clinical Neuropsychologist

## 2023-11-28 NOTE — Progress Notes (Signed)
 Occupational Therapy Session Note  Patient Details  Name: Jason Stevenson MRN: [id number] Date of Birth: 1985/05/07  Today's Date: 11/28/2023 OT Individual Time: 9197-9140 OT Individual Time Calculation (min): 57 min    Short Term Goals: Week 1:  OT Short Term Goal 1 (Week 1): Pt wil recall hemi-dressing techniques with MIN questioning cues OT Short Term Goal 2 (Week 1): Pt will maintain L UE in safety position during ADL and functional transfers with MIN A OT Short Term Goal 3 (Week 1): Pt will complete LB dressing with MOD A OT Short Term Goal 4 (Week 1): Pt will complete 1/3 steps of toileting with MIN A  Skilled Therapeutic Interventions/Progress Updates:     Pt received semi-reclined in bed presenting to be in good spirits receptive to skilled OT session reporting 4/10 pain in neck/upper back- OT offering intermittent rest breaks, repositioning, and therapeutic support to optimize participation in therapy session. Education provided on trunk alignment and positioning to decrease pain with Pt receptive to education. Focused this session on ADL retraining with emphasis on hemi-techniques and increasing Pt's overall independence. Pt with slowed processing and task initiation during activity requiring increased time to learn and implement hemi-techniques.   Mobility:  -Bed Mobility: Pt transitioned to EOB using leg lifter to bring L LE to EOB with MOD A +MOD verbal cues and increased time for technique.  -Sit<>Stands: Pt completed multiple sit<>stands during ADLs this AM with R UE positioned on bed rail. Pt required heavy MIN to light MOD a to complete +verbal cues for technique and set-up.  -Squat pivot: Pt completed squat pivot transfers EOB > wc to R with light MOD A required to guide hips.   ADL:  -Bathing: Pt requesting to wash up d/t having accident in bed this AM. Engaged Pt in completing U/LB bathing sitting EOB with education provided on hemi-techniques. MOD A required overall for  bathing task with assistance required to wash R UE, lower B LEs, and buttocks. Pt able to maintain standing balance with R UE supported on bed rail with MIN A while OT assisted with LB bathing.  -Dressing: Education provided on hemi-dressing techniques for U/LB dressing with demonstration and opportunity for practice provided following. Donned [place] shirt with light MOD A and MAX verbal cues for technique. Utilized reacher to weave B LEs into underwear/shorts with MOD A +increased time and MAX step by step verbal cues for technique. He stood with use of bed rail with MIN A and maintained standing balance MIN A with OT stabilizing L LE while OT brought pants to waist. Increased problem solving deficit noted during dressing tasks as well as decreased attention.  Pt was left resting in wc with call bell in reach and all needs met. PT entering room at end of session.   Therapy Documentation Precautions:  Precautions Precautions: Fall Precaution/Restrictions Comments: L Hemiplegia Restrictions Weight Bearing Restrictions Per Provider Order: No   Therapy/Group: Individual Therapy  [name] 11/28/2023, 7:43 AM

## 2023-11-28 NOTE — Telephone Encounter (Signed)
 [place] [phone] called from [facility] about needing labs for this mutual patient.

## 2023-11-28 NOTE — Progress Notes (Signed)
 Physical Therapy Session Note  Patient Details  Name: Jason Stevenson MRN: [id number] Date of Birth: Jul 30, 1985  Today's Date: 11/28/2023 PT Individual Time: 0900-0930 PT Individual Time Calculation (min): 30 min   Short Term Goals: Week 1:  PT Short Term Goal 1 (Week 1): Pt willperform bed mobility with consistent ModA overall. PT Short Term Goal 2 (Week 1): Pt will perform sit<>stand transfers with no AD and modA. PT Short Term Goal 3 (Week 1): Pt will perform squat pivot transfers with MinA. PT Short Term Goal 4 (Week 1): Pt will perform stand pivot transfers with ModA +1. PT Short Term Goal 5 (Week 1): Pt will perform ambulation with MaxA +1 using RW for at least 25 ft.  Skilled Therapeutic Interventions/Progress Updates:      Pt sitting up in wheelchair and agreeable to therapy treatment. Pt has no c/o pain. MD arriving for morning rounding to discuss progress, treatment plans.   Transported patient to main rehab hallway to work on standing and gait training using R hand rail. Sit<>stand with R hand rail at minA level. Patient over compensates in standing with limited weight bearing through LLE. Worked on unsupported standing with L knee blocked, minA for balance and facilitation for shifting weight onto his L side. Progressed to gait training with modA using R hand rail 61ft , PT managing LLE for ~90% of gait cycle, needing assist for swing phase and placement with TC for engaging quad for stance control.   Patient returned to his room at end of treatment and was left sitting up with seat belt alarm on, pillow supporting LUE, needs met.   Therapy Documentation Precautions:  Precautions Precautions: Fall Precaution/Restrictions Comments: L Hemiplegia Restrictions Weight Bearing Restrictions Per Provider Order: No General:    Therapy/Group: Individual Therapy  Sherlean SHAUNNA Perks 11/28/2023, 7:53 AM

## 2023-11-28 NOTE — Progress Notes (Signed)
 Physical Therapy Session Note  Patient Details  Name: Jason Stevenson MRN: [id number] Date of Birth: 09/07/1985  Today's Date: 11/28/2023 PT Individual Time: 1535-1615 PT Individual Time Calculation (min): 40 min   Short Term Goals: Week 1:  PT Short Term Goal 1 (Week 1): Pt willperform bed mobility with consistent ModA overall. PT Short Term Goal 2 (Week 1): Pt will perform sit<>stand transfers with no AD and modA. PT Short Term Goal 3 (Week 1): Pt will perform squat pivot transfers with MinA. PT Short Term Goal 4 (Week 1): Pt will perform stand pivot transfers with ModA +1. PT Short Term Goal 5 (Week 1): Pt will perform ambulation with MaxA +1 using RW for at least 25 ft.  Skilled Therapeutic Interventions/Progress Updates:  Patient supported and seated upright in bed on entrance to room. Patient alert and agreeable to PT session. Wife present.   Patient with no pain complaint at start of session.  Therapeutic Activity: Bed Mobility: Pt performed supine > sit with cues required for technique to bring LLE off EOB using trunk stability and turn of pelvis with CGA to LLE. Overall MinA to complete to EOB. VC required for seated balance.  Transfers: Pt performed squat pivot transfer to R side with ModA for power up and swivel of hips. Pt remembers to reach across w/c to far armrest to support.   Pt is able to initiate use of RLE to advance w/c using hemitechnique to propel out of room with CGA. Then transported dependently to main gym via w/c.   Neuromuscular Re-ed: NMR facilitated during session with focus on seated/ standing balance, upright posture through motor control/ proprioception. Pt guided in technique and motor planning/ control required for squat pivot to R and L w/c<>matable. Then focus on upright seated balance and posture for improved facilitation. Pt able to perform seated pelvic rotation ant/ post with demonstration and instructions, then on own with facilitation initially  with MinA then supervision. Seated trunk flexion performed to L/R to bring elbow to mat table and then rise to seated position. Unable to L without assist until facilitated upper thoracic flexion. Then able to perform with increased cues and CGA.   Standing balance facilitated with pt able to rise to stand with MinA and can establish stance with CGA. LLE with motor impersistence and slowly melts into flexion. Requires guard into block to maintain balance. Stands for >22min with no UE support and up to Min/ ModA for balance. Guided with NDT facilitation for increased spinal activation, and retracted shoulders for more upright posture. Also facilitated at upper and lower abdominals in order to facilitate posterior pelvic rotation with vc.    NMR performed for improvements in motor control and coordination, balance, sequencing, judgement, and self confidence/ efficacy in performing all aspects of mobility at highest level of independence.   Patient seated upright in w/c at end of session with brakes locked, no alarm set as wife with pt and about to take pt outside under grounds pass, and all needs within reach.   Therapy Documentation Precautions:  Precautions Precautions: Fall Precaution/Restrictions Comments: L Hemiplegia Restrictions Weight Bearing Restrictions Per Provider Order: No  Pain:  No pain related this session.    Therapy/Group: Individual Therapy  [name] PT, DPT, CSRS 11/28/2023, 7:12 PM

## 2023-11-29 DIAGNOSIS — R5381 Other malaise: Secondary | ICD-10-CM | POA: Diagnosis not present

## 2023-11-29 LAB — PHOSPHORUS: Phosphorus: 4.6 mg/dL (ref 2.5–4.6)

## 2023-11-29 LAB — BASIC METABOLIC PANEL WITH GFR
Anion gap: 11 (ref 5–15)
BUN: 17 mg/dL (ref 6–20)
CO2: 22 mmol/L (ref 22–32)
Calcium: 9.5 mg/dL (ref 8.9–10.3)
Chloride: 103 mmol/L (ref 98–111)
Creatinine, Ser: 0.97 mg/dL (ref 0.61–1.24)
GFR, Estimated: >60 mL/min (ref >60)
Glucose, Bld: 230 mg/dL — ABNORMAL HIGH (ref 70–99)
Potassium: 4.5 mmol/L (ref 3.5–5.1)
Sodium: 136 mmol/L (ref 135–145)

## 2023-11-29 LAB — GLUCOSE, CAPILLARY: Glucose-Capillary: 180 mg/dL — ABNORMAL HIGH (ref 70–99)

## 2023-11-29 LAB — MAGNESIUM: Magnesium: 1.5 mg/dL — ABNORMAL LOW (ref 1.7–2.4)

## 2023-11-29 MED ORDER — INSULIN GLARGINE-YFGN 100 UNIT/ML ~~LOC~~ SOLN
29.0000 [IU] | Freq: Every day | SUBCUTANEOUS | Status: DC
  Administered 2023-11-29 – 2023-12-01 (×3): 29 [IU] via SUBCUTANEOUS
  Filled 2023-11-29 (×4): qty 0.29

## 2023-11-29 MED ORDER — CYCLOSPORINE MODIFIED (GENGRAF) 25 MG PO CAPS
100.0000 mg | ORAL_CAPSULE | Freq: Two times a day (BID) | ORAL | Status: DC
  Administered 2023-11-29 – 2023-12-09 (×21): 100 mg via ORAL
  Filled 2023-11-29 (×21): qty 4

## 2023-11-29 NOTE — Progress Notes (Signed)
 PROGRESS NOTE   Subjective/Complaints: No new complaints today BMP stable except for hyperglycemia, increase semglee  to 29U Tolerated therapy today   Review of systems: denies pain  Objective:   No results found.  Recent Labs    11/27/23 0438  WBC 4.3  HGB 9.6*  HCT 29.6*  PLT 312   Recent Labs    11/28/23 0558 11/29/23 0949  NA 137 136  K 4.3 4.5  CL 104 103  CO2 23 22  GLUCOSE 188* 230*  BUN 17 17  CREATININE 1.03 0.97  CALCIUM  9.3 9.5    Intake/Output Summary (Last 24 hours) at 11/29/2023 1624 Last data filed at 11/29/2023 1409 Gross per 24 hour  Intake 448.5 ml  Output 900 ml  Net -451.5 ml        Physical Exam: Vital Signs Blood pressure (!) 129/90, pulse 99, temperature 98.3 F (36.8 C), temperature source Oral, resp. rate 18, height 5' 11 (1.803 m), weight 87.8 kg, SpO2 100%.  General: No acute distress, working with therapy in his room HEENT trach site well healed  Pleasant affect Heart: Regular rate and rhythm no rubs murmurs or extra sounds Lungs: Clear auscultation bilaterally, nonlabored breathing Abdomen: Soft nontender nondistended positive bowel sounds Extremities: No clubbing, cyanosis, or edema Skin: No breakdown noted on visible portion Neurologic: Cranial nerves II through XII intact, motor strength is 4/5 in right deltoid, bicep, tricep, grip, hip flexor, knee extensors, ankle dorsiflexor and plantar flexor LUE  2 -/5 left deltoid, bicep, tricep, finger flexors and trace extensors. Left lower extremity 3 - hip flexor 4 - knee extensor 0 ankle dorsiflexor and plantar flexor, stable 8/16 Sensory exam normal sensation to light touch is reduced on the left side below the knee as well as in the left upper extremity  Altered sensation plantar 1st and 2nd digit right hand, Tinel's mildly positive right wrist Musculoskeletal: Full range of motion in all 4 extremities. No joint  swelling     Assessment/Plan: 1. Functional deficits which require 3+ hours per day of interdisciplinary therapy in a comprehensive inpatient rehab setting. Physiatrist is providing close team supervision and 24 hour management of active medical problems listed below. Physiatrist and rehab team continue to assess barriers to discharge/monitor patient progress toward functional and medical goals  Care Tool:  Bathing    Body parts bathed by patient: Left lower leg, Right arm, Left arm, Chest, Abdomen, Front perineal area, Right upper leg, Left upper leg   Body parts bathed by helper: Buttocks, Left arm, Right arm, Left upper leg, Left lower leg     Bathing assist Assist Level: Moderate Assistance - Patient 50 - 74%     Upper Body Dressing/Undressing Upper body dressing   What is the patient wearing?: Pull over shirt    Upper body assist Assist Level: Moderate Assistance - Patient 50 - 74%    Lower Body Dressing/Undressing Lower body dressing      What is the patient wearing?: Pants, Underwear/pull up     Lower body assist Assist for lower body dressing: Maximal Assistance - Patient 25 - 49%     Toileting Toileting    Toileting assist Assist for toileting:  Maximal Assistance - Patient 25 - 49%     Transfers Chair/bed transfer  Transfers assist     Chair/bed transfer assist level: Moderate Assistance - Patient 50 - 74%     Locomotion Ambulation   Ambulation assist      Assist level: Maximal Assistance - Patient 25 - 49% Assistive device: Other (comment) (R hallway HR) Max distance: 15 ft   Walk 10 feet activity   Assist     Assist level: Maximal Assistance - Patient 25 - 49% Assistive device: Other (comment) (R hallway HR)   Walk 50 feet activity   Assist Walk 50 feet with 2 turns activity did not occur: Safety/medical concerns         Walk 150 feet activity   Assist Walk 150 feet activity did not occur: Safety/medical concerns          Walk 10 feet on uneven surface  activity   Assist Walk 10 feet on uneven surfaces activity did not occur: Safety/medical concerns         Wheelchair     Assist Is the patient using a wheelchair?: Yes Type of Wheelchair: Manual    Wheelchair assist level: Dependent - Patient 0% Max wheelchair distance: 250 ft    Wheelchair 50 feet with 2 turns activity    Assist        Assist Level: Dependent - Patient 0%   Wheelchair 150 feet activity     Assist      Assist Level: Dependent - Patient 0%   Blood pressure (!) 129/90, pulse 99, temperature 98.3 F (36.8 C), temperature source Oral, resp. rate 18, height 5' 11 (1.803 m), weight 87.8 kg, SpO2 100%.  Medical Problem List and Plan: 1. Functional deficits secondary to debility following PEA arrest             -patient may  shower             -ELOS/Goals: 18-21d MinA  -Continue CIR  - Expected discharge 9/2 Team conference today please see physician documentation under team conference tab, met with team  to discuss problems,progress, and goals. Formulized individual treatment plan based on medical history, underlying problem and comorbidities.  2.  Antithrombotics: -DVT/anticoagulation:  Pharmaceutical: Lovenox              -antiplatelet therapy: N/A due to IPH 3. Pain Management: Tylenol  prn Been on Lyrica  at home for diabetic neuropathic pain. 4. Mood/Behavior/Sleep: LCSW to follow for evaluation and support.              -antipsychotic agents: N/A 5. Neuropsych/cognition: This patient is not capable of making decisions on his own behalf without input from [facility].  - Patient was seen by neuropsychology, appreciate insight 6. Skin/Wound Care: Routine pressure relief measures. Monitor incision for healing.  7. Fluids/Electrolytes/Nutrition: Strict I/O. Renal diet with CM restrictions --no fluid restriction needed 8. PEA arrest X 2/CMV viremia: on Valcyte  900 mg daily thru 10/26 with monitoring of levels  every 2 weeks post completion.  Pharmacy consult  9. Heart transplant: On Gengraf  BID with prednisone  daily             --continue Coreg , lower dose Lipitor  and Zetia .  Bactrim  until 8/13 Heart Failure team will follow -appreciate assistance -Called [place] [facility] transplant, cyclosporine  level added for Monday.  -[name] discussed [employer] was contacted about his pacemaker and working correctly. 10. Elevated phosphorous: Will keep on liberalized diet for food choices and to help with intake             --  continue Phoslo  1334 tid ac--recheck level in am.  -8/14 Phos mildly elevated today, continue to monitor trend.  Continue PhosLo   Recheck levels  11. Hx seizures: Continue Keppra  and Trileptal .   12. 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  Monitor BS ac/hs. Continue Insulin  glargine with novolog  tid ac  CBG (last 3)  Recent Labs    11/28/23 1722 11/28/23 2135 11/29/23 1105  GLUCAP 197* 216* 180*  Increase semglee  to 29U  13. Anemia of chronic disease: Monitor H/H for stability.   -8/14 Hemoglobin stable at 9.6  14. R hand numbness: Possible carpal tunnel  -continue carpal tunnel wrist splint use at night  LOS: 5 days A FACE TO FACE EVALUATION WAS PERFORMED  Jason Stevenson 11/29/2023, 4:24 PM

## 2023-11-29 NOTE — Progress Notes (Signed)
 Advanced Heart Failure Rounding Note  Cardiologist: Maude Emmer, MD   Chief Complaint: S/p OHT, assistance with immunosuppression  Subjective:    He continues to work with rehab. Doing well. No CP or SOB   Cyclosporine  level  274  Objective:   Weight Range: 87.8 kg Body mass index is 27 kg/m.   Vital Signs:   Temp:  [97.9 F (36.6 C)-98.5 F (36.9 C)] 98.3 F (36.8 C) (08/16 1320) Pulse Rate:  [81-99] 99 (08/16 1320) Resp:  [16-18] 18 (08/16 1320) BP: (129-144)/(90-99) 129/90 (08/16 1320) SpO2:  [99 %-100 %] 100 % (08/16 1320) Weight:  [87.8 kg] 87.8 kg (08/16 0500) Last BM Date : 11/28/23  Weight change: Filed Weights   11/27/23 0500 11/28/23 0500 11/29/23 0500  Weight: 87.3 kg 87.3 kg 87.8 kg   Intake/Output:   Intake/Output Summary (Last 24 hours) at 11/29/2023 1427 Last data filed at 11/29/2023 1409 Gross per 24 hour  Intake 448.5 ml  Output 900 ml  Net -451.5 ml     Physical Exam   General: sitting in chair  No resp difficulty HEENT: normal Neck: supple. no JVD. Carotids 2+ bilat; no bruits. No lymphadenopathy or thryomegaly appreciated. Cor: PMI nondisplaced. Regular rate & rhythm. No rubs, gallops or murmurs. Lungs: clear Abdomen: soft, nontender, nondistended. No hepatosplenomegaly. No bruits or masses. Good bowel sounds. Extremities: no cyanosis, clubbing, rash, edema Neuro: alert & orientedx3, L-sided weakness Affect pleasant  Labs    CBC Recent Labs    11/27/23 0438  WBC 4.3  HGB 9.6*  HCT 29.6*  MCV 87.6  PLT 312   Basic Metabolic Panel Recent Labs    91/85/74 0438 11/28/23 0558 11/29/23 0949  NA 133* 137 136  K 4.4 4.3 4.5  CL 102 104 103  CO2 22 23 22   GLUCOSE 180* 188* 230*  BUN 18 17 17   CREATININE 1.01 1.03 0.97  CALCIUM  9.1 9.3 9.5  MG 1.7  --  1.5*  PHOS 4.8*  --  4.6   Liver Function Tests No results for input(s): AST, ALT, ALKPHOS, BILITOT, PROT, ALBUMIN in the last 72 hours.  No results for  input(s): LIPASE, AMYLASE in the last 72 hours. Cardiac Enzymes No results for input(s): CKTOTAL, CKMB, CKMBINDEX, TROPONINI in the last 72 hours.  BNP: BNP (last 3 results) No results for input(s): BNP in the last 8760 hours.  ProBNP (last 3 results) No results for input(s): PROBNP in the last 8760 hours.   D-Dimer No results for input(s): DDIMER in the last 72 hours. Hemoglobin A1C No results for input(s): HGBA1C in the last 72 hours.  Fasting Lipid Panel No results for input(s): CHOL, HDL, LDLCALC, TRIG, CHOLHDL, LDLDIRECT in the last 72 hours. Thyroid  Function Tests No results for input(s): TSH, T4TOTAL, T3FREE, THYROIDAB in the last 72 hours.  Invalid input(s): FREET3  Other results:   Imaging    No results found.    Medications:     Scheduled Medications:  atorvastatin   10 mg Oral Q supper   budesonide  (PULMICORT ) nebulizer solution  0.25 mg Nebulization BID   calcium  acetate  1,334 mg Oral TID WC   carvedilol   12.5 mg Oral BID WC   cycloSPORINE  modified  100 mg Oral BID   ezetimibe   10 mg Oral Daily   heparin  injection (subcutaneous)  5,000 Units Subcutaneous Q8H   insulin  aspart  0-5 Units Subcutaneous QHS   insulin  aspart  0-9 Units Subcutaneous TID WC   insulin  aspart  5 Units Subcutaneous TID WC   insulin  glargine-yfgn  28 Units Subcutaneous Q2200   levETIRAcetam   1,500 mg Oral BID   lidocaine   2 patch Transdermal Q24H   melatonin  3 mg Oral QHS   OXcarbazepine   300 mg Oral Daily   OXcarbazepine   600 mg Oral QHS   pantoprazole   40 mg Oral Daily   predniSONE   5 mg Oral Q breakfast   pregabalin   75 mg Oral QHS   Ensure Max Protein  11 oz Oral BID   protein supplement  1 Scoop Oral TID WC   traZODone   50 mg Oral QHS   valGANciclovir   900 mg Oral Daily    Infusions:    PRN Medications: acetaminophen , alum & mag hydroxide-simeth, bisacodyl , diclofenac  Sodium, diphenhydrAMINE ,  guaiFENesin -dextromethorphan, ipratropium-albuterol , prochlorperazine  **OR** prochlorperazine  **OR** prochlorperazine , senna-docusate, sodium phosphate    Patient Profile   38 y.o. 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   Transferred to CIR 11/24/23 after prolonged hospitalization at [facility] w/ CMV viremia c/b PEA arrest, subsequent VA ECMO cannulation >>VV ECMO and eventual trach.  Assessment/Plan  HFrEF d/t NICM s/p OHT 8/24 - s/p OHT 11/26/22. Last EMBx in 3/25 negative with normal EF.  - TTE pre-arrest with EF 40% and severely reduced RV function - s/p PEA arrest (see below) - RHC 10/15/23 with normal CI and elevated PCWP. EMBx taken 7/2 - TTE 7/29 with normal biV function.  - euvolemic on exam - he is now off MMF - continue bactrim  (end 11/26/23) - continue prednisone  5 mg daily - continue atorva 10 mg at bedtime + zetia  10 mg daily - continue coreg  12.5 mg bid. May need to add additional antihypertensive agent.  - CyA level 274 (goal 150-200). D/w [facility] team and PharmD personally. Drop cyclosporine  to 100 bid. 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 - induced with ganciclovir - continue valganciclovir  900 mg daily (plan for 3 months) - no change   PEA arrest on 10/01/23 - s/p VA ECLS transitioned to VV ECMO 6/21. De-cannulated 6/25. Trach placed.   - presumed respiratory etiology, possibly d/t unwitnessed seizure - ESBL Kleb HCAP s/p meropenem  - trach de-cannulated 8/4   H/o CVA - stroke with hemorrhagic conversion in 2022 and again in 4/25 - residual L sided deficits and seizures - continue keppra  and trileptal   - Appreciate CIR rehab    Length of Stay: 5  Toribio Fuel, MD  11/29/2023, 2:27 PM  Advanced Heart Failure Team Pager [phone] (M-F; 7a - 5p)   Please contact [facility] for night-coverage after hours (5p -7a ) and weekends on [email or web address]

## 2023-11-29 NOTE — Progress Notes (Signed)
 Physical Therapy Session Note  Patient Details  Name: Jason Stevenson MRN: [id number] Date of Birth: 1985/07/09  Today's Date: 11/29/2023 PT Individual Time: 9199-9154 PT Individual Time Calculation (min): 45 min   Short Term Goals: Week 1:  PT Short Term Goal 1 (Week 1): Pt willperform bed mobility with consistent ModA overall. PT Short Term Goal 2 (Week 1): Pt will perform sit<>stand transfers with no AD and modA. PT Short Term Goal 3 (Week 1): Pt will perform squat pivot transfers with MinA. PT Short Term Goal 4 (Week 1): Pt will perform stand pivot transfers with ModA +1. PT Short Term Goal 5 (Week 1): Pt will perform ambulation with MaxA +1 using RW for at least 25 ft.  Skilled Therapeutic Interventions/Progress Updates: Pt presents semi-reclined in bed and agreeable to therapy.  Pt transfers to sitting EOB w/ min A, although slow progression and cues to complete.  Pt able to doff slipper sock although increased extension of trunk backwards.  PT assist w/ donning shoes LLE especially for tongue.  Pt performed donning R shoe w/ supervision.  Pt performed squat pivot to R bed > w/c w/ mod A and cues.  Pt wheeled to hallway hand rail.  Pt performed sit to stands w/ min/mod A.  Pt performed standing w/ manual facilitation for increased weight bearing LLE w/ blocking of L knee.  Mirror utilized for input, cues to maintain upright posture.  2 platform under R foot utilized for increased NMR to LLE.  Pt performed partial squats w/ PT facilitating WB LLE.  Pt returned to room and remained sitting in w/c w/ chair alarm on and all needs in reach.     Therapy Documentation Precautions:  Precautions Precautions: Fall Precaution/Restrictions Comments: L Hemiplegia Restrictions Weight Bearing Restrictions Per Provider Order: No General:   Vital Signs: Oxygen Therapy SpO2: 100 % O2 Device: Room Air Pain:0/10     Therapy/Group: Individual Therapy  Christyne Mccain P Keyonda Bickle 11/29/2023, 8:46 AM

## 2023-11-29 NOTE — Progress Notes (Signed)
 Speech Language Pathology Daily Session Note  Patient Details  Name: Jason Stevenson MRN: [id number] Date of Birth: 28-Jan-1986  Today's Date: 11/29/2023 SLP Individual Time: 1116-1200 SLP Individual Time Calculation (min): 44 min  Short Term Goals: Week 1: SLP Short Term Goal 1 (Week 1): Patient will increase vocal intensity during conversation to reach 100% intelligibility given min verbal A SLP Short Term Goal 2 (Week 1): Patient will recall and utilize memory strategies given min multimodal A SLP Short Term Goal 3 (Week 1): Patient will demonstrate sustained attention to functional tasks given min multimodal A SLP Short Term Goal 4 (Week 1): Patient will demonstrate mildly complex problem solving skills given min multimodal A  Skilled Therapeutic Interventions: Pt seen for skilled ST with focus on cognitive communication goals, pt in bed and pleasantly agreeable to therapeutic tasks. Pt requests a different activity than med management this date. SLP facilitating money counting activity by providing Supervision A cues for 100% accuracy.  Pt able to solve functional math problems provided extra time with 100% accuracy mod I. Pt does benefit from occasional verbal cues throughout session to maintain attention to complex tasks, enjoys conversation with SLP and provided rest breaks from structured activities as needed. Pt able to read simple instructions and respond with 85% accuracy provided Supervision A verbal cues. Pt encouraged to complete EMST independently d/t time constraints during therapy session. Pt left in bed with alarm set, all needs met and call button within reach. Cont ST POC.   Pain Pain Assessment Pain Scale: 0-10 Pain Score: 0-No pain  Therapy/Group: Individual Therapy  Arletha Marschke R Jahking Lesser 11/29/2023, 12:14 PM

## 2023-11-29 NOTE — Progress Notes (Signed)
 Physical Therapy Session Note  Patient Details  Name: Jason Stevenson MRN: [id number] Date of Birth: [birth year]  Today's Date: 11/29/2023 PT Individual Time: 1015-1102 PT Individual Time Calculation (min): 47 min   Short Term Goals: Week 1:  PT Short Term Goal 1 (Week 1): Pt willperform bed mobility with consistent ModA overall. PT Short Term Goal 2 (Week 1): Pt will perform sit<>stand transfers with no AD and modA. PT Short Term Goal 3 (Week 1): Pt will perform squat pivot transfers with MinA. PT Short Term Goal 4 (Week 1): Pt will perform stand pivot transfers with ModA +1. PT Short Term Goal 5 (Week 1): Pt will perform ambulation with MaxA +1 using RW for at least 25 ft.  Skilled Therapeutic Interventions/Progress Updates:  Pt was seen bedside in the am. Pt transferred supine to edge of bed with min A and verbal cues with increased time. Pt maintained sitting balance on edge of bed with S. Pt transferred sit to stand from edge of bed with mod to max A and pivot to w/c to R with mod to max A and verbal cues. Treatment focused on transfers and gait with hemiwalker. Pt transferred sit to stand from w/c without assistive device and mod A, pt tends to lean to L requiring verbal and tactile cues to correct. Pt transferred sit to stand with hemiwalker and mod A with verbal cues. Improved standing balance with use of hemiwalker. Pt ambulated about 5 feet with hemiwalker and mod A, requiring physical assist to advance L LE and verbal cues for sequencing. Pt transferred w/c to edge of bed with hemiwalker and mod A with verbal/tactile cues. Pt transferred edge of bed to supine with mod to max A and verbal cues. Pt left sitting up in bed with bed alarm and all needs within reach.   Therapy Documentation Precautions:  Precautions Precautions: Fall Precaution/Restrictions Comments: L Hemiplegia Restrictions Weight Bearing Restrictions Per Provider Order: No General:   Vital Signs: Oxygen  Therapy SpO2: 100 % O2 Device: Room Air Pain: Pain Assessment Pain Scale: 0-10 Pain Score: 0-No pain  Therapy/Group: Individual Therapy  [name] G 11/29/2023, 12:19 PM

## 2023-11-29 NOTE — Progress Notes (Signed)
 Occupational Therapy Session Note  Patient Details  Name: Jarrette Dehner MRN: [id number] Date of Birth: 1985-11-18  Today's Date: 11/29/2023 OT Individual Time: 1400-1459 OT Individual Time Calculation (min): 59 min    Short Term Goals: Week 1:  OT Short Term Goal 1 (Week 1): Pt wil recall hemi-dressing techniques with MIN questioning cues OT Short Term Goal 2 (Week 1): Pt will maintain L UE in safety position during ADL and functional transfers with MIN A OT Short Term Goal 3 (Week 1): Pt will complete LB dressing with MOD A OT Short Term Goal 4 (Week 1): Pt will complete 1/3 steps of toileting with MIN A   Skilled Therapeutic Interventions/Progress Updates:    Pt bed level at time of session no pain reported. Extended time throughout session for slow movements and tangental but very pleasant. Supine > sit with MIN A for LLE management, squat pivot bed <> wheelchair with MOD/MAX and cues for hand placement. Oral hygiene sink level MIN A, self propelling wheelchair short distance with MIN A as well for turn. Set up in gym at [facility] with ACE on LUE for 5 minutes on level 1 for NMR and OT performing scapular facilitation. Back in room transferred back to bed same as above alarm on call bell in reach   Therapy Documentation Precautions:  Precautions Precautions: Fall Precaution/Restrictions Comments: L Hemiplegia Restrictions Weight Bearing Restrictions Per Provider Order: No    Therapy/Group: Individual Therapy  Chiquita JAYSON Hopping 11/29/2023, 3:06 PM

## 2023-11-30 DIAGNOSIS — R5381 Other malaise: Secondary | ICD-10-CM | POA: Diagnosis not present

## 2023-11-30 LAB — BASIC METABOLIC PANEL WITH GFR
Anion gap: 10 (ref 5–15)
BUN: 14 mg/dL (ref 6–20)
CO2: 22 mmol/L (ref 22–32)
Calcium: 9.2 mg/dL (ref 8.9–10.3)
Chloride: 107 mmol/L (ref 98–111)
Creatinine, Ser: 0.85 mg/dL (ref 0.61–1.24)
GFR, Estimated: >60 mL/min (ref >60)
Glucose, Bld: 160 mg/dL — ABNORMAL HIGH (ref 70–99)
Potassium: 4 mmol/L (ref 3.5–5.1)
Sodium: 139 mmol/L (ref 135–145)

## 2023-11-30 LAB — GLUCOSE, CAPILLARY
Glucose-Capillary: 160 mg/dL — ABNORMAL HIGH (ref 70–99)
Glucose-Capillary: 150 mg/dL — ABNORMAL HIGH (ref 70–99)
Glucose-Capillary: 197 mg/dL — ABNORMAL HIGH (ref 70–99)
Glucose-Capillary: 247 mg/dL — ABNORMAL HIGH (ref 70–99)

## 2023-11-30 LAB — MAGNESIUM: Magnesium: 1.5 mg/dL — ABNORMAL LOW (ref 1.7–2.4)

## 2023-11-30 MED ORDER — CARVEDILOL 25 MG PO TABS
25.0000 mg | ORAL_TABLET | Freq: Two times a day (BID) | ORAL | Status: DC
  Administered 2023-11-30 – 2023-12-16 (×32): 25 mg via ORAL
  Filled 2023-11-30 (×32): qty 1

## 2023-11-30 MED ORDER — MAGNESIUM SULFATE 2 GM/50ML IV SOLN
2.0000 g | Freq: Once | INTRAVENOUS | Status: AC
  Administered 2023-11-30: 2 g via INTRAVENOUS
  Filled 2023-11-30: qty 50

## 2023-11-30 NOTE — Progress Notes (Signed)
 Slept good. Asking about removing NSL. Bilateral groin dressings changed, areas almost healed. BUE splints applied and left PRAFO boot applied. Occasionally incontinent of urine. Has difficulty using urinal, since LUE flacid. Messaged Mercedes R/T DBP of 101. No changes at this time. Jason Stevenson A

## 2023-11-30 NOTE — Progress Notes (Signed)
 PROGRESS NOTE   Subjective/Complaints: Diastolic BP elevated, magnesium  low, supplemented 2grams IV mag and coreg  increased to 25mg  BID Diet changed to carb modified   Review of systems: denies pain  Objective:   No results found.  No results for input(s): WBC, HGB, HCT, PLT in the last 72 hours.  Recent Labs    11/29/23 0949 11/30/23 0617  NA 136 139  K 4.5 4.0  CL 103 107  CO2 22 22  GLUCOSE 230* 160*  BUN 17 14  CREATININE 0.97 0.85  CALCIUM  9.5 9.2    Intake/Output Summary (Last 24 hours) at 11/30/2023 1453 Last data filed at 11/30/2023 1202 Gross per 24 hour  Intake 594.5 ml  Output 500 ml  Net 94.5 ml        Physical Exam: Vital Signs Blood pressure (!) 135/104, pulse 100, temperature 98 F (36.7 C), temperature source Oral, resp. rate 18, height 5' 11 (1.803 m), weight 86.9 kg, SpO2 100%.  General: No acute distress, somnolent HEENT trach site well healed  Pleasant affect Heart: Regular rate and rhythm no rubs murmurs or extra sounds Lungs: Clear auscultation bilaterally, nonlabored breathing Abdomen: Soft nontender nondistended positive bowel sounds Extremities: No clubbing, cyanosis, or edema Skin: No breakdown noted on visible portion  PRIOR EXAMS: Neurologic: Cranial nerves II through XII intact, motor strength is 4/5 in right deltoid, bicep, tricep, grip, hip flexor, knee extensors, ankle dorsiflexor and plantar flexor LUE  2 -/5 left deltoid, bicep, tricep, finger flexors and trace extensors. Left lower extremity 3 - hip flexor 4 - knee extensor 0 ankle dorsiflexor and plantar flexor, stable 8/16 Sensory exam normal sensation to light touch is reduced on the left side below the knee as well as in the left upper extremity  Altered sensation plantar 1st and 2nd digit right hand, Tinel's mildly positive right wrist Musculoskeletal: Full range of motion in all 4 extremities. No joint  swelling     Assessment/Plan: 1. Functional deficits which require 3+ hours per day of interdisciplinary therapy in a comprehensive inpatient rehab setting. Physiatrist is providing close team supervision and 24 hour management of active medical problems listed below. Physiatrist and rehab team continue to assess barriers to discharge/monitor patient progress toward functional and medical goals  Care Tool:  Bathing    Body parts bathed by patient: Left lower leg, Right arm, Left arm, Chest, Abdomen, Front perineal area, Right upper leg, Left upper leg   Body parts bathed by helper: Buttocks, Left arm, Right arm, Left upper leg, Left lower leg     Bathing assist Assist Level: Moderate Assistance - Patient 50 - 74%     Upper Body Dressing/Undressing Upper body dressing   What is the patient wearing?: Pull over shirt    Upper body assist Assist Level: Moderate Assistance - Patient 50 - 74%    Lower Body Dressing/Undressing Lower body dressing      What is the patient wearing?: Pants, Underwear/pull up     Lower body assist Assist for lower body dressing: Maximal Assistance - Patient 25 - 49%     Toileting Toileting    Toileting assist Assist for toileting: Maximal Assistance - Patient 25 -  49%     Transfers Chair/bed transfer  Transfers assist     Chair/bed transfer assist level: Moderate Assistance - Patient 50 - 74%     Locomotion Ambulation   Ambulation assist      Assist level: Maximal Assistance - Patient 25 - 49% Assistive device: Other (comment) (R hallway HR) Max distance: 15 ft   Walk 10 feet activity   Assist     Assist level: Maximal Assistance - Patient 25 - 49% Assistive device: Other (comment) (R hallway HR)   Walk 50 feet activity   Assist Walk 50 feet with 2 turns activity did not occur: Safety/medical concerns         Walk 150 feet activity   Assist Walk 150 feet activity did not occur: Safety/medical concerns          Walk 10 feet on uneven surface  activity   Assist Walk 10 feet on uneven surfaces activity did not occur: Safety/medical concerns         Wheelchair     Assist Is the patient using a wheelchair?: Yes Type of Wheelchair: Manual    Wheelchair assist level: Dependent - Patient 0% Max wheelchair distance: 250 ft    Wheelchair 50 feet with 2 turns activity    Assist        Assist Level: Dependent - Patient 0%   Wheelchair 150 feet activity     Assist      Assist Level: Dependent - Patient 0%   Blood pressure (!) 135/104, pulse 100, temperature 98 F (36.7 C), temperature source Oral, resp. rate 18, height 5' 11 (1.803 m), weight 86.9 kg, SpO2 100%.  Medical Problem List and Plan: 1. Functional deficits secondary to debility following PEA arrest             -patient may  shower             -ELOS/Goals: 18-21d MinA  -Continue CIR  - Expected discharge 9/2 Team conference today please see physician documentation under team conference tab, met with team  to discuss problems,progress, and goals. Formulized individual treatment plan based on medical history, underlying problem and comorbidities.  2.  Antithrombotics: -DVT/anticoagulation:  Pharmaceutical: Lovenox              -antiplatelet therapy: N/A due to IPH 3. Pain Management: Tylenol  prn Been on Lyrica  at home for diabetic neuropathic pain. 4. Mood/Behavior/Sleep: LCSW to follow for evaluation and support.              -antipsychotic agents: N/A 5. Neuropsych/cognition: This patient is not capable of making decisions on his own behalf without input from [facility].  - Patient was seen by neuropsychology, appreciate insight 6. Skin/Wound Care: Routine pressure relief measures. Monitor incision for healing.  7. Fluids/Electrolytes/Nutrition: Strict I/O. Renal diet with CM restrictions --no fluid restriction needed 8. PEA arrest X 2/CMV viremia: on Valcyte  900 mg daily thru 10/26 with monitoring of levels  every 2 weeks post completion.  Pharmacy consult  9. Heart transplant: On Gengraf  BID with prednisone  daily             --continue Coreg , lower dose Lipitor  and Zetia .  Bactrim  until 8/13 Heart Failure team will follow -appreciate assistance -Called [place] [facility] heart transplant, cyclosporine  level added for Monday.  -[name] discussed [employer] was contacted about his pacemaker and working correctly. 10. Elevated phosphorous: Will keep on liberalized diet for food choices and to help with intake             --  continue Phoslo  1334 tid ac--recheck level in am.  -8/14 Phos mildly elevated today, continue to monitor trend.  Continue PhosLo   Recheck levels  11. Hx seizures: continue Keppra  and Trileptal .   12. 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  Monitor BS ac/hs. Continue Insulin  glargine with novolog  tid ac  CBG (last 3)  Recent Labs    11/29/23 1105 11/30/23 0619 11/30/23 1132  GLUCAP 180* 160* 150*  Continue semglee  to 29U, diet changed to carb modified  13. Anemia of chronic disease: Monitor H/H for stability.   -8/14 Hemoglobin stable at 9.6  14. R hand numbness: Possible carpal tunnel  -continue carpal tunnel wrist splint use at night  15, Diastolic hypertension: 2grams IV mag ordered, coreg  increased to 25mg  BID  LOS: 6 days A FACE TO FACE EVALUATION WAS PERFORMED  Jason Stevenson 11/30/2023, 2:53 PM

## 2023-11-30 NOTE — Plan of Care (Signed)
  Problem: Consults Goal: RH GENERAL PATIENT EDUCATION Description: See Patient Education module for education specifics. Outcome: Progressing   Problem: RH BOWEL ELIMINATION Goal: RH STG MANAGE BOWEL WITH ASSISTANCE Description: STG Manage Bowel with mod I Assistance. Outcome: Progressing Goal: RH STG MANAGE BOWEL W/MEDICATION W/ASSISTANCE Description: STG Manage Bowel with Medication with mod I Assistance. Outcome: Progressing   Problem: RH BLADDER ELIMINATION Goal: RH STG MANAGE BLADDER WITH ASSISTANCE Description: STG Manage Bladder With mod I Assistance Outcome: Progressing   Problem: RH SKIN INTEGRITY Goal: RH STG SKIN FREE OF INFECTION/BREAKDOWN Description: Manage w min assist Outcome: Progressing   Problem: RH SAFETY Goal: RH STG ADHERE TO SAFETY PRECAUTIONS W/ASSISTANCE/DEVICE Description: STG Adhere to Safety Precautions With cues Assistance/Device. Outcome: Progressing   Problem: RH PAIN MANAGEMENT Goal: RH STG PAIN MANAGED AT OR BELOW PT'S PAIN GOAL Description: Pain < 4 with prns Outcome: Progressing   Problem: RH KNOWLEDGE DEFICIT GENERAL Goal: RH STG INCREASE KNOWLEDGE OF SELF CARE AFTER HOSPITALIZATION Description: Patient and wife will be able to manage care at discharge using educational resources independently Outcome: Progressing   Problem: Education: Goal: Ability to describe self-care measures that may prevent or decrease complications (Diabetes Survival Skills Education) will improve Outcome: Progressing Goal: Individualized Educational Video(s) Outcome: Progressing   Problem: Coping: Goal: Ability to adjust to condition or change in health will improve Outcome: Progressing   Problem: Fluid Volume: Goal: Ability to maintain a balanced intake and output will improve Outcome: Progressing   Problem: Health Behavior/Discharge Planning: Goal: Ability to identify and utilize available resources and services will improve Outcome:  Progressing Goal: Ability to manage health-related needs will improve Outcome: Progressing   Problem: Metabolic: Goal: Ability to maintain appropriate glucose levels will improve Outcome: Progressing   Problem: Nutritional: Goal: Maintenance of adequate nutrition will improve Outcome: Progressing Goal: Progress toward achieving an optimal weight will improve Outcome: Progressing   Problem: Skin Integrity: Goal: Risk for impaired skin integrity will decrease Outcome: Progressing   Problem: Tissue Perfusion: Goal: Adequacy of tissue perfusion will improve Outcome: Progressing

## 2023-11-30 NOTE — Progress Notes (Signed)
 Made Mercedes aware about elevated DBP this AM and about changing diet to Carb. Mod. She will update Dr. Atha. Jeanifer Halliday A

## 2023-12-01 DIAGNOSIS — D638 Anemia in other chronic diseases classified elsewhere: Secondary | ICD-10-CM | POA: Diagnosis not present

## 2023-12-01 DIAGNOSIS — Z941 Heart transplant status: Secondary | ICD-10-CM | POA: Diagnosis not present

## 2023-12-01 DIAGNOSIS — R790 Abnormal level of blood mineral: Secondary | ICD-10-CM

## 2023-12-01 DIAGNOSIS — I5020 Unspecified systolic (congestive) heart failure: Secondary | ICD-10-CM | POA: Diagnosis not present

## 2023-12-01 DIAGNOSIS — Z930 Tracheostomy status: Secondary | ICD-10-CM | POA: Diagnosis not present

## 2023-12-01 DIAGNOSIS — R200 Anesthesia of skin: Secondary | ICD-10-CM

## 2023-12-01 DIAGNOSIS — I69154 Hemiplegia and hemiparesis following nontraumatic intracerebral hemorrhage affecting left non-dominant side: Secondary | ICD-10-CM | POA: Diagnosis not present

## 2023-12-01 DIAGNOSIS — B259 Cytomegaloviral disease, unspecified: Secondary | ICD-10-CM | POA: Diagnosis not present

## 2023-12-01 DIAGNOSIS — R569 Unspecified convulsions: Secondary | ICD-10-CM | POA: Diagnosis not present

## 2023-12-01 DIAGNOSIS — R5381 Other malaise: Secondary | ICD-10-CM | POA: Diagnosis not present

## 2023-12-01 DIAGNOSIS — E1142 Type 2 diabetes mellitus with diabetic polyneuropathy: Secondary | ICD-10-CM | POA: Diagnosis not present

## 2023-12-01 DIAGNOSIS — E1165 Type 2 diabetes mellitus with hyperglycemia: Secondary | ICD-10-CM | POA: Diagnosis not present

## 2023-12-01 DIAGNOSIS — E785 Hyperlipidemia, unspecified: Secondary | ICD-10-CM | POA: Diagnosis not present

## 2023-12-01 DIAGNOSIS — N179 Acute kidney failure, unspecified: Secondary | ICD-10-CM | POA: Diagnosis not present

## 2023-12-01 DIAGNOSIS — Z794 Long term (current) use of insulin: Secondary | ICD-10-CM | POA: Diagnosis not present

## 2023-12-01 LAB — BASIC METABOLIC PANEL WITH GFR
Anion gap: 10 (ref 5–15)
BUN: 15 mg/dL (ref 6–20)
CO2: 25 mmol/L (ref 22–32)
Calcium: 9.2 mg/dL (ref 8.9–10.3)
Chloride: 107 mmol/L (ref 98–111)
Creatinine, Ser: 0.95 mg/dL (ref 0.61–1.24)
GFR, Estimated: >60 mL/min (ref >60)
Glucose, Bld: 105 mg/dL — ABNORMAL HIGH (ref 70–99)
Potassium: 3.8 mmol/L (ref 3.5–5.1)
Sodium: 142 mmol/L (ref 135–145)

## 2023-12-01 LAB — GLUCOSE, CAPILLARY
Glucose-Capillary: 154 mg/dL — ABNORMAL HIGH (ref 70–99)
Glucose-Capillary: 163 mg/dL — ABNORMAL HIGH (ref 70–99)
Glucose-Capillary: 239 mg/dL — ABNORMAL HIGH (ref 70–99)
Glucose-Capillary: 92 mg/dL (ref 70–99)
Glucose-Capillary: 185 mg/dL — ABNORMAL HIGH (ref 70–99)
Glucose-Capillary: 225 mg/dL — ABNORMAL HIGH (ref 70–99)
Glucose-Capillary: 187 mg/dL — ABNORMAL HIGH (ref 70–99)

## 2023-12-01 LAB — CBC
HCT: 30.1 % — ABNORMAL LOW (ref 39.0–52.0)
Hemoglobin: 9.7 g/dL — ABNORMAL LOW (ref 13.0–17.0)
MCH: 28.4 pg (ref 26.0–34.0)
MCHC: 32.2 g/dL (ref 30.0–36.0)
MCV: 88 fL (ref 80.0–100.0)
Platelets: 318 K/uL (ref 150–400)
RBC: 3.42 MIL/uL — ABNORMAL LOW (ref 4.22–5.81)
RDW: 15.2 % (ref 11.5–15.5)
WBC: 5 K/uL (ref 4.0–10.5)
nRBC: 0 % (ref 0.0–0.2)

## 2023-12-01 LAB — MAGNESIUM: Magnesium: 1.5 mg/dL — ABNORMAL LOW (ref 1.7–2.4)

## 2023-12-01 MED ORDER — MAGNESIUM SULFATE 4 GM/100ML IV SOLN
4.0000 g | Freq: Once | INTRAVENOUS | Status: DC
  Filled 2023-12-01: qty 100

## 2023-12-01 MED ORDER — MAGNESIUM OXIDE -MG SUPPLEMENT 400 (240 MG) MG PO TABS
400.0000 mg | ORAL_TABLET | Freq: Every day | ORAL | Status: DC
  Administered 2023-12-01 – 2023-12-02 (×2): 400 mg via ORAL
  Filled 2023-12-01 (×2): qty 1

## 2023-12-01 NOTE — Progress Notes (Signed)
 Advanced Heart Failure Rounding Note  Cardiologist: Jason Emmer, MD   Chief Complaint: OHT, assistance with immunosuppression  Subjective:    Denies shortness of breath. Complaining of wrist pain.    Objective:   Weight Range: 89.4 kg Body mass index is 27.49 kg/m.   Vital Signs:   Temp:  [97.7 F (36.5 C)-98.8 F (37.1 C)] 97.7 F (36.5 C) (08/18 0537) Pulse Rate:  [84-102] 84 (08/18 0720) Resp:  [18-19] 18 (08/18 0720) BP: (128-144)/(93-104) 128/94 (08/18 0537) SpO2:  [98 %-100 %] 98 % (08/18 0537) Weight:  [86.1 kg-89.4 kg] 89.4 kg (08/18 0537) Last BM Date : 11/29/23  Weight change: Filed Weights   11/30/23 0500 12/01/23 0500 12/01/23 0537  Weight: 86.9 kg 86.1 kg 89.4 kg   Intake/Output:   Intake/Output Summary (Last 24 hours) at 12/01/2023 0741 Last data filed at 12/01/2023 0601 Gross per 24 hour  Intake 830 ml  Output 1050 ml  Net -220 ml     Physical Exam  General:   No resp difficulty, in bed.  Neck: no JVD.  Cor: Regular rate & rhythm.  Lungs: clear Abdomen: soft, nontender, nondistended.  Extremities: no  edema Neuro: alert & oriented x3  Labs    CBC Recent Labs    12/01/23 0535  WBC 5.0  HGB 9.7*  HCT 30.1*  MCV 88.0  PLT 318   Basic Metabolic Panel Recent Labs    91/83/74 0949 11/30/23 0617 12/01/23 0535  NA 136 139 142  K 4.5 4.0 3.8  CL 103 107 107  CO2 22 22 25   GLUCOSE 230* 160* 105*  BUN 17 14 15   CREATININE 0.97 0.85 0.95  CALCIUM  9.5 9.2 9.2  MG 1.5* 1.5* 1.5*  PHOS 4.6  --   --    Liver Function Tests No results for input(s): AST, ALT, ALKPHOS, BILITOT, PROT, ALBUMIN in the last 72 hours.  No results for input(s): LIPASE, AMYLASE in the last 72 hours. Cardiac Enzymes No results for input(s): CKTOTAL, CKMB, CKMBINDEX, TROPONINI in the last 72 hours.  BNP: BNP (last 3 results) No results for input(s): BNP in the last 8760 hours.  ProBNP (last 3 results) No results for  input(s): PROBNP in the last 8760 hours.   D-Dimer No results for input(s): DDIMER in the last 72 hours. Hemoglobin A1C No results for input(s): HGBA1C in the last 72 hours.  Fasting Lipid Panel No results for input(s): CHOL, HDL, LDLCALC, TRIG, CHOLHDL, LDLDIRECT in the last 72 hours. Thyroid  Function Tests No results for input(s): TSH, T4TOTAL, T3FREE, THYROIDAB in the last 72 hours.  Invalid input(s): FREET3  Other results:   Imaging    No results found.    Medications:     Scheduled Medications:  atorvastatin   10 mg Oral Q supper   budesonide  (PULMICORT ) nebulizer solution  0.25 mg Nebulization BID   calcium  acetate  1,334 mg Oral TID WC   carvedilol   25 mg Oral BID WC   cycloSPORINE  modified  100 mg Oral BID   ezetimibe   10 mg Oral Daily   heparin  injection (subcutaneous)  5,000 Units Subcutaneous Q8H   insulin  aspart  0-5 Units Subcutaneous QHS   insulin  aspart  0-9 Units Subcutaneous TID WC   insulin  aspart  5 Units Subcutaneous TID WC   insulin  glargine-yfgn  29 Units Subcutaneous Q2200   levETIRAcetam   1,500 mg Oral BID   lidocaine   2 patch Transdermal Q24H   melatonin  3 mg Oral QHS  OXcarbazepine   300 mg Oral Daily   OXcarbazepine   600 mg Oral QHS   pantoprazole   40 mg Oral Daily   predniSONE   5 mg Oral Q breakfast   pregabalin   75 mg Oral QHS   Ensure Max Protein  11 oz Oral BID   protein supplement  1 Scoop Oral TID WC   traZODone   50 mg Oral QHS   valGANciclovir   900 mg Oral Daily    Infusions:    PRN Medications: acetaminophen , alum & mag hydroxide-simeth, bisacodyl , diclofenac  Sodium, diphenhydrAMINE , guaiFENesin -dextromethorphan, ipratropium-albuterol , prochlorperazine  **OR** prochlorperazine  **OR** prochlorperazine , senna-docusate, sodium phosphate    Patient Profile   38 y.o. 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   Transferred to CIR 11/24/23 after prolonged hospitalization at [facility] w/ CMV viremia c/b PEA arrest, subsequent [facility] ECMO cannulation >>VV ECMO and eventual trach.  Assessment/Plan  HFrEF d/t NICM s/p OHT 8/24 - s/p OHT 11/26/22. Last EMBx in 3/25 negative with normal EF.  - TTE pre-arrest with EF 40% and severely reduced RV function - s/p PEA arrest (see below) - RHC 10/15/23 with normal CI and elevated PCWP. EMBx taken 7/2 - TTE 7/29 with normal biV function.  - Completed Bactrim   11/26/23 - continue prednisone  5 mg daily - continue atorva 10 mg at bedtime + zetia  10 mg daily - continue coreg  25 mg twice a day.  May need to add additional antihypertensive agent.  - CSA level 346 (goal 150-200). D/w [facility] team and PharmD personally. On lower dose of 100 bid. Recheck trough 1 week. Discussed with pharmacy.            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 - induced with ganciclovir - continue valganciclovir  900 mg daily (plan for 3 months) - no change   PEA arrest on 10/01/23 - s/p VA ECLS transitioned to VV ECMO 6/21. De-cannulated 6/25. Trach placed.   - presumed respiratory etiology, possibly d/t unwitnessed seizure - ESBL Kleb HCAP s/p meropenem  - trach de-cannulated 8/4   H/o CVA - stroke with hemorrhagic conversion in 2022 and again in 4/25 - residual L sided deficits and seizures - continue keppra  and trileptal   - Appreciate CIR rehab    Length of Stay: 7  Jason Mcneese, NP  12/01/2023, 7:41 AM  Advanced Heart Failure Team Pager [phone] (M-F; 7a - 5p)  Please contact [facility] for night-coverage after hours (5p -7a ) and weekends on [email or web address]

## 2023-12-01 NOTE — Progress Notes (Signed)
 PROGRESS NOTE   Subjective/Complaints: No new concerns or complaints this morning.  Has been able to use his right wrist brace due to IV.   Review of systems: denies fever headache, shortness of breath, abdominal pain, nausea, vomiting, diarrhea, constipation   Objective:   No results found.  Recent Labs    12/01/23 0535  WBC 5.0  HGB 9.7*  HCT 30.1*  PLT 318    Recent Labs    11/30/23 0617 12/01/23 0535  NA 139 142  K 4.0 3.8  CL 107 107  CO2 22 25  GLUCOSE 160* 105*  BUN 14 15  CREATININE 0.85 0.95  CALCIUM  9.2 9.2    Intake/Output Summary (Last 24 hours) at 12/01/2023 1814 Last data filed at 12/01/2023 1230 Gross per 24 hour  Intake 358 ml  Output 850 ml  Net -492 ml        Physical Exam: Vital Signs Blood pressure (!) 168/118, pulse 100, temperature 98.8 F (37.1 C), temperature source Oral, resp. rate 20, height 5' 11 (1.803 m), weight 89.4 kg, SpO2 98%.  General: No acute distress HEENT trach site well healed  Pleasant affect Heart: Regular rate and rhythm no rubs murmurs or extra sounds Lungs: Clear auscultation bilaterally, nonlabored breathing Abdomen: Soft nontender nondistended positive bowel sounds Extremities: No clubbing, cyanosis, or edema Skin: No breakdown noted on visible portion  Neurologic: Awake and alert, cranial nerves II through XII intact, motor strength is 4/5 in right deltoid, bicep, tricep, grip, hip flexor, knee extensors, ankle dorsiflexor and plantar flexor LUE  2 -/5 left deltoid, bicep, tricep, finger flexors and trace extensors. Left lower extremity 3 - hip flexor 4 - knee extensor 0 ankle dorsiflexor and plantar flexor, stable 8/16 Sensory exam normal sensation to light touch is reduced on the left side below the knee as well as in the left upper extremity  Altered sensation plantar 1st and 2nd digit right hand, Tinel's mildly positive right wrist-not checked  today Musculoskeletal: Full range of motion in all 4 extremities. No joint swelling    Prior neuro assessment is c/w today's exam 12/01/2023.   Assessment/Plan: 1. Functional deficits which require 3+ hours per day of interdisciplinary therapy in a comprehensive inpatient rehab setting. Physiatrist is providing close team supervision and 24 hour management of active medical problems listed below. Physiatrist and rehab team continue to assess barriers to discharge/monitor patient progress toward functional and medical goals  Care Tool:  Bathing    Body parts bathed by patient: Left lower leg, Right arm, Left arm, Chest, Abdomen, Front perineal area, Right upper leg, Left upper leg   Body parts bathed by helper: Buttocks, Left arm, Right arm, Left upper leg, Left lower leg     Bathing assist Assist Level: Moderate Assistance - Patient 50 - 74%     Upper Body Dressing/Undressing Upper body dressing   What is the patient wearing?: Pull over shirt    Upper body assist Assist Level: Moderate Assistance - Patient 50 - 74%    Lower Body Dressing/Undressing Lower body dressing      What is the patient wearing?: Pants, Underwear/pull up     Lower body assist Assist  for lower body dressing: Maximal Assistance - Patient 25 - 49%     Toileting Toileting    Toileting assist Assist for toileting: Maximal Assistance - Patient 25 - 49%     Transfers Chair/bed transfer  Transfers assist     Chair/bed transfer assist level: Moderate Assistance - Patient 50 - 74%     Locomotion Ambulation   Ambulation assist      Assist level: Maximal Assistance - Patient 25 - 49% Assistive device: Other (comment) (R hallway HR) Max distance: 15 ft   Walk 10 feet activity   Assist     Assist level: Maximal Assistance - Patient 25 - 49% Assistive device: Other (comment) (R hallway HR)   Walk 50 feet activity   Assist Walk 50 feet with 2 turns activity did not occur:  Safety/medical concerns         Walk 150 feet activity   Assist Walk 150 feet activity did not occur: Safety/medical concerns         Walk 10 feet on uneven surface  activity   Assist Walk 10 feet on uneven surfaces activity did not occur: Safety/medical concerns         Wheelchair     Assist Is the patient using a wheelchair?: Yes Type of Wheelchair: Manual    Wheelchair assist level: Dependent - Patient 0% Max wheelchair distance: 250 ft    Wheelchair 50 feet with 2 turns activity    Assist        Assist Level: Dependent - Patient 0%   Wheelchair 150 feet activity     Assist      Assist Level: Dependent - Patient 0%   Blood pressure (!) 168/118, pulse 100, temperature 98.8 F (37.1 C), temperature source Oral, resp. rate 20, height 5' 11 (1.803 m), weight 89.4 kg, SpO2 98%.  Medical Problem List and Plan: 1. Functional deficits secondary to debility following PEA arrest             -patient may  shower             -ELOS/Goals: 18-21d MinA  -Continue CIR  - Expected discharge 9/2  2.  Antithrombotics: -DVT/anticoagulation:  Pharmaceutical: Lovenox              -antiplatelet therapy: N/A due to IPH 3. Pain Management: Tylenol  prn Been on Lyrica  at home for diabetic neuropathic pain. 4. Mood/Behavior/Sleep: LCSW to follow for evaluation and support.              -antipsychotic agents: N/A 5. Neuropsych/cognition: This patient is not capable of making decisions on his own behalf without input from [facility].  - Patient was seen by neuropsychology, appreciate insight 6. Skin/Wound Care: Routine pressure relief measures. Monitor incision for healing.  7. Fluids/Electrolytes/Nutrition: Strict I/O. Renal diet with CM restrictions --no fluid restriction needed 8. PEA arrest X 2/CMV viremia: on Valcyte  900 mg daily thru 10/26 with monitoring of levels every 2 weeks post completion.  Pharmacy consult  9. Heart transplant: On Gengraf  BID with  prednisone  daily             --continue Coreg , lower dose Lipitor  and Zetia .  Bactrim  until 8/13 Heart Failure team will follow -appreciate assistance -Called [place] [facility] heart transplant, cyclosporine  level added for Monday.  -[name] discussed [employer] was contacted about his pacemaker and working correctly.  - 8/18 cyclosporin level down to 274, appears cardiology planning to recheck on 8/21 10. Elevated phosphorous: Will keep on liberalized diet  for food choices and to help with intake             --continue Phoslo  1334 tid ac--recheck level in am.  -8/14 Phos mildly elevated today, continue to monitor trend.  Continue PhosLo   Recheck levels  11. Hx seizures: continue Keppra  and Trileptal .   12. 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  Monitor BS ac/hs. Continue Insulin  glargine with novolog  tid ac  CBG (last 3)  Recent Labs    12/01/23 0605 12/01/23 1134 12/01/23 1628  GLUCAP 92 185* 225*  Continue semglee  to 29U, diet changed to carb modified  13. Anemia of chronic disease: Monitor H/H for stability.   -8/14 Hemoglobin stable at 9.6  14. R hand numbness: Possible carpal tunnel  -continue carpal tunnel wrist splint use at night, currently unable to use it due to IV site  15, Diastolic hypertension: 2grams IV mag ordered, coreg  increased to 25mg  BID -8/18 BP stable this morning Addendum BP appears elevated this evening, per chart review appears patient noted to be more lethargic and complaining of chest discomfort.  Appears PA has contacted cardiology and rapid response has been called.  16.  Hypomagnesemia.  - 8/18 patient received IV mag over the weekend, started on mag oxide 400 mg daily by cardiology  LOS: 7 days A FACE TO FACE EVALUATION WAS PERFORMED  Murray Collier 12/01/2023, 6:14 PM

## 2023-12-01 NOTE — Progress Notes (Signed)
 Physical Therapy Session Note  Patient Details  Name: Jason Stevenson MRN: [id number] Date of Birth: 1985/11/27  Today's Date: 12/01/2023 PT Individual Time: 0920-1000 PT Individual Time Calculation (min): 40 min   Short Term Goals: Week 1:  PT Short Term Goal 1 (Week 1): Pt willperform bed mobility with consistent ModA overall. PT Short Term Goal 2 (Week 1): Pt will perform sit<>stand transfers with no AD and modA. PT Short Term Goal 3 (Week 1): Pt will perform squat pivot transfers with MinA. PT Short Term Goal 4 (Week 1): Pt will perform stand pivot transfers with ModA +1. PT Short Term Goal 5 (Week 1): Pt will perform ambulation with MaxA +1 using RW for at least 25 ft.  Skilled Therapeutic Interventions/Progress Updates:  Patient supine in bed on entrance to room. Patient alert and agreeable to PT session.   Patient with no pain complaint at start of session.  Therapeutic Activity: Bed Mobility: Pt performed supine > sit with continued ModA for LLE off EOB. VC/ tc required for trunk stabilization to assist with turn of pelvis to bring LLE off EOB. Transfers: Pt performed squat pivot transfers from bed > w/c at pt's R side. Continues to require significant cueing to maintain attn to task as pt is preferring to continue conversations from first entering room. Hand placement to R armrest to promote forward lean and then requires ModA for power up and swivel of hips to chair. Requires 2 efforts to complete. Significant vc required for focus to laterally scoot for improve position in w/c. Sit<>stand NMR for focus to LLE and potential for improving initial stance upon rise to stand. Continues to demo melt to L side but can improve with vc for focus and to stand tall.  Gait Training:  Pt ambulated 25 ft using underarm support to L side from PT with up to MaxA for managing step placement and knee stability for LLE and +2 providing CGA to RUE. Improved confidence with slight improvement in  reciprocaction timing today. Demonstrated continued need for guard/ block to L knee during stance phase and vc/ tc for sequencing steps, lateral weight shifting and facilitating forward hip propulsion with RLE during stance phase as pt tending to flex forward and push hip back.  Patient seated upright in w/c at end of session with brakes locked, belt alarm set, and all needs within reach.   Therapy Documentation Precautions:  Precautions Precautions: Fall Precaution/Restrictions Comments: L Hemiplegia Restrictions Weight Bearing Restrictions Per Provider Order: No  Pain:  No pain related this session.   Therapy/Group: Individual Therapy  [name] PT, DPT, CSRS 12/01/2023, 10:30 AM

## 2023-12-01 NOTE — Progress Notes (Signed)
 Pt's DBP continues to climb. PM meds given early, pt appears more lethergic and slower to respond, manual BP taken 168/118, during assessment patient stated that he was having pain and was rubbing his chest around his chest, stated that he couldn't tell if it was chest pain or skin pain around that area. Daphne, NP notified, Rapid response called request rounding, Dan, PA notified, cardiology on call messaged. Continuing to monitor condition.

## 2023-12-01 NOTE — Progress Notes (Signed)
 Occupational Therapy Session Note  Patient Details  Name: Jason Stevenson MRN: [id number] Date of Birth: 1985/04/29  Today's Date: 12/01/2023 OT Individual Time: 1300-1403 OT Individual Time Calculation (min): 63 min    Short Term Goals: Week 1:  OT Short Term Goal 1 (Week 1): Pt wil recall hemi-dressing techniques with MIN questioning cues OT Short Term Goal 2 (Week 1): Pt will maintain L UE in safety position during ADL and functional transfers with MIN A OT Short Term Goal 3 (Week 1): Pt will complete LB dressing with MOD A OT Short Term Goal 4 (Week 1): Pt will complete 1/3 steps of toileting with MIN A  Skilled Therapeutic Interventions/Progress Updates:     Pt received sitting up in bed. Pt presenting to be in good spirits receptive to skilled OT session reporting 0/10 pain- OT offering intermittent rest breaks, repositioning, and therapeutic support to optimize participation in therapy session. Pt requesting shower this PM. Focused this session on ADL retraining with emphasis on modified techniques, activity tolerance, sitting balance and L UE positioning.    Mobility:  -Bed Mobility: Pt transitioned to EOB with HOB elevated using bed rail and leg lifter with MOD A overall to fully lift trunk and bring L LE to EOB. Pt returned to bed at end of session with MOD A to bring L hemi-body into bed +verbal cues for safety and technique.   -Shower/toilet transfer: Utilized STEDY for transfer EOB > Toilet > TTB positioned in walk-in shower. Pt able to maintain perched sitting position with CGA overall, however he requires verbal cues to utilize self HOH A to stabilize L UE on STEDY grab bar. Pt requires MOD A for sit > stand from TTB d/t low seat. MIN A required for sit<>stands from EOB and elevated toilet seat +verbal cues for technique and body positioning.    ADL:  -Toileting: 3/3 toileting tasks completed on elevated toilet seat positioned over toilet. MAX A required for clothing  management and peri-care in seated position- MIN A for standing balance.  -Bathing: U/LB bathing completed in seated position. Pt able to complete lateral leans using walls of shower and grab bars for balance while OT assisted with washing buttocks. Education provided on hemi-techniques and long handled sponge use with Pt able to demonstrate teach back as evidence of learning- assistance required for washing lower B LEs to ensure cleanliness. Pt with slowed initiation and decreased sequencing during bathing requiring verbal cues. MOD A required overall for U/LB bathing tasks.  -Dressing: When doffing OH shirt, MOD A and verbal cues required to utilize hemi-technique. Following shower, U/LB dressing completed sitting EOB using hemi-technique and education provided on utilizing reacher to weave B LEs into pants. OH shirt donned with MOD A overall and pants with MAX A using hemi-technique and reacher +MAX verbal cues. Pt able to maintain standing balance in STEDY while OT brought pants to waist with MIN A.   Pt was left resting in bed with call bell in reach, bed alarm on, and all needs met.    Therapy Documentation Precautions:  Precautions Precautions: Fall Precaution/Restrictions Comments: L Hemiplegia Restrictions Weight Bearing Restrictions Per Provider Order: No   Therapy/Group: Individual Therapy  [name] 12/01/2023, 8:02 AM

## 2023-12-01 NOTE — Progress Notes (Signed)
 Speech Language Pathology Daily Session Note  Patient Details  Name: Leib Elahi MRN: [id number] Date of Birth: 02-01-1986  Today's Date: 12/01/2023 SLP Individual Time: 9199-9141 SLP Individual Time Calculation (min): 58 min  Short Term Goals: Week 1: SLP Short Term Goal 1 (Week 1): Patient will increase vocal intensity during conversation to reach 100% intelligibility given min verbal A SLP Short Term Goal 2 (Week 1): Patient will recall and utilize memory strategies given min multimodal A SLP Short Term Goal 3 (Week 1): Patient will demonstrate sustained attention to functional tasks given min multimodal A SLP Short Term Goal 4 (Week 1): Patient will demonstrate mildly complex problem solving skills given min multimodal A  Skilled Therapeutic Interventions: Skilled therapy session focused on communication and cognitive goals. SLP facilitated session by encouraging patient to recall events from weekend. Patient shared broad activities completed in therapies (PT/OT/ST). SLP continued to target memory through educating patient on WRAP (write, repeat, associate, picture) memory strategies. SLP then encouraged use through paragraph retention task. Patient answered questions with 100% accuracy after a 10 minute delay. SLP targeted communication through prompting patient to utilize EMST at 40cm H2O. Patient completed x25 repetitions. Patient left in bed with alarm set and call bell in reach. Continue POC  Pain None reported   Therapy/Group: Individual Therapy  Tamaya Pun M.A., CCC-SLP 12/01/2023, 7:43 AM

## 2023-12-01 NOTE — Progress Notes (Signed)
 Speech Language Pathology Daily Session Note  Patient Details  Name: Jason Stevenson MRN: [id number] Date of Birth: 30-Dec-1985  Today's Date: 12/01/2023 SLP Individual Time: 1138-1203 SLP Individual Time Calculation (min): 25 min  Short Term Goals: Week 1: SLP Short Term Goal 1 (Week 1): Patient will increase vocal intensity during conversation to reach 100% intelligibility given min verbal A SLP Short Term Goal 2 (Week 1): Patient will recall and utilize memory strategies given min multimodal A SLP Short Term Goal 3 (Week 1): Patient will demonstrate sustained attention to functional tasks given min multimodal A SLP Short Term Goal 4 (Week 1): Patient will demonstrate mildly complex problem solving skills given min multimodal A  Skilled Therapeutic Interventions: SLP conducted skilled therapy session targeting cognitive communication goals. Upon SLP entry, reviewed prior therapy sessions. Patient benefited from supervision assist to recall therapy sessions and activities completed in both. Patient recalled WRAP memory strategies and specific story details from earlier SLP session with modI. SLP facilitated completion of EMST150, 5 sets of 5 repetitions at 40 cmH2O and min cues for sustained attention. Patient completed sustained attention and scanning task and benefited from supervision to min assist for thoroughness. Patient was left in room with call bell in reach and alarm set. SLP will continue to target goals per plan of care.        Pain None  Therapy/Group: Individual Therapy  Ashle Stief, M.A., CCC-SLP  Ayako Tapanes A Tangia Pinard 12/01/2023, 12:09 PM

## 2023-12-02 DIAGNOSIS — R790 Abnormal level of blood mineral: Secondary | ICD-10-CM | POA: Diagnosis not present

## 2023-12-02 DIAGNOSIS — R5381 Other malaise: Secondary | ICD-10-CM | POA: Diagnosis not present

## 2023-12-02 DIAGNOSIS — Z794 Long term (current) use of insulin: Secondary | ICD-10-CM | POA: Diagnosis not present

## 2023-12-02 DIAGNOSIS — I5020 Unspecified systolic (congestive) heart failure: Secondary | ICD-10-CM | POA: Diagnosis not present

## 2023-12-02 DIAGNOSIS — E1169 Type 2 diabetes mellitus with other specified complication: Secondary | ICD-10-CM | POA: Diagnosis not present

## 2023-12-02 DIAGNOSIS — R200 Anesthesia of skin: Secondary | ICD-10-CM | POA: Diagnosis not present

## 2023-12-02 DIAGNOSIS — I159 Secondary hypertension, unspecified: Secondary | ICD-10-CM

## 2023-12-02 DIAGNOSIS — Z941 Heart transplant status: Secondary | ICD-10-CM | POA: Diagnosis not present

## 2023-12-02 LAB — BASIC METABOLIC PANEL WITH GFR
Anion gap: 8 (ref 5–15)
BUN: 15 mg/dL (ref 6–20)
CO2: 25 mmol/L (ref 22–32)
Calcium: 9.3 mg/dL (ref 8.9–10.3)
Chloride: 107 mmol/L (ref 98–111)
Creatinine, Ser: 0.89 mg/dL (ref 0.61–1.24)
GFR, Estimated: >60 mL/min (ref >60)
Glucose, Bld: 93 mg/dL (ref 70–99)
Potassium: 3.7 mmol/L (ref 3.5–5.1)
Sodium: 140 mmol/L (ref 135–145)

## 2023-12-02 LAB — MAGNESIUM: Magnesium: 1.4 mg/dL — ABNORMAL LOW (ref 1.7–2.4)

## 2023-12-02 LAB — GLUCOSE, CAPILLARY
Glucose-Capillary: 84 mg/dL (ref 70–99)
Glucose-Capillary: 157 mg/dL — ABNORMAL HIGH (ref 70–99)
Glucose-Capillary: 195 mg/dL — ABNORMAL HIGH (ref 70–99)
Glucose-Capillary: 134 mg/dL — ABNORMAL HIGH (ref 70–99)

## 2023-12-02 MED ORDER — LOSARTAN POTASSIUM 25 MG PO TABS
25.0000 mg | ORAL_TABLET | Freq: Every day | ORAL | Status: DC
  Administered 2023-12-02: 25 mg via ORAL
  Filled 2023-12-02: qty 1

## 2023-12-02 MED ORDER — INSULIN ASPART 100 UNIT/ML IJ SOLN
6.0000 [IU] | Freq: Three times a day (TID) | INTRAMUSCULAR | Status: DC
  Administered 2023-12-02 – 2023-12-04 (×6): 6 [IU] via SUBCUTANEOUS

## 2023-12-02 MED ORDER — INSULIN GLARGINE-YFGN 100 UNIT/ML ~~LOC~~ SOLN: 26.0000 [IU] | Freq: Every day | SUBCUTANEOUS | Status: DC

## 2023-12-02 MED ORDER — MAGNESIUM OXIDE -MG SUPPLEMENT 400 (240 MG) MG PO TABS
400.0000 mg | ORAL_TABLET | Freq: Two times a day (BID) | ORAL | Status: DC
  Administered 2023-12-02 – 2023-12-07 (×11): 400 mg via ORAL
  Filled 2023-12-02 (×11): qty 1

## 2023-12-02 MED ORDER — LOSARTAN POTASSIUM 25 MG PO TABS: 12.5000 mg | ORAL_TABLET | Freq: Every day | ORAL | Status: DC

## 2023-12-02 MED ORDER — INSULIN GLARGINE 100 UNIT/ML ~~LOC~~ SOLN
26.0000 [IU] | Freq: Every day | SUBCUTANEOUS | Status: DC
  Administered 2023-12-02 – 2023-12-06 (×5): 26 [IU] via SUBCUTANEOUS
  Filled 2023-12-02 (×6): qty 0.26

## 2023-12-02 MED ORDER — MAGNESIUM SULFATE 2 GM/50ML IV SOLN
2.0000 g | Freq: Once | INTRAVENOUS | Status: AC
  Administered 2023-12-02: 2 g via INTRAVENOUS
  Filled 2023-12-02: qty 50

## 2023-12-02 NOTE — Progress Notes (Signed)
 PROGRESS NOTE   Subjective/Complaints: Continues to have some altered sensation in his right hand.  This is not particularly painful.  Rapid response yesterday when had elevated BP and chest discomfort, this is resolved today.   Review of systems: denies fever headache, shortness of breath, abdominal pain, nausea, vomiting, diarrhea, constipation   + R hand tingling  Objective:   No results found.  Recent Labs    12/01/23 0535  WBC 5.0  HGB 9.7*  HCT 30.1*  PLT 318    Recent Labs    12/01/23 0535 12/02/23 0503  NA 142 140  K 3.8 3.7  CL 107 107  CO2 25 25  GLUCOSE 105* 93  BUN 15 15  CREATININE 0.95 0.89  CALCIUM  9.2 9.3    Intake/Output Summary (Last 24 hours) at 12/02/2023 1448 Last data filed at 12/02/2023 1325 Gross per 24 hour  Intake 1076 ml  Output 775 ml  Net 301 ml        Physical Exam: Vital Signs Blood pressure 132/85, pulse 99, temperature 98.7 F (37.1 C), temperature source Oral, resp. rate 17, height 5' 11 (1.803 m), weight 84.5 kg, SpO2 100%.  General: No acute distress, working with therapy on crossword puzzles in his bed HEENT trach site well healed  Pleasant affect Heart: Regular rate and rhythm no rubs murmurs or extra sounds Lungs: Clear auscultation bilaterally, nonlabored breathing Abdomen: Soft nontender nondistended positive bowel sounds Extremities: No clubbing, cyanosis, or edema Skin: No breakdown noted on visible portion  Neurologic: Awake and alert, cranial nerves II through XII intact, motor strength is 4/5 in right deltoid, bicep, tricep, grip, hip flexor, knee extensors, ankle dorsiflexor and plantar flexor LUE  2 -/5 left deltoid, bicep, tricep, finger flexors and trace extensors. Left lower extremity 3 - hip flexor 4 - knee extensor 0 ankle dorsiflexor and plantar flexor, stable 8/16 Sensory exam normal sensation to light touch is reduced on the left side below  the knee as well as in the left upper extremity  Altered sensation plantar 1st and 2nd digit right hand Musculoskeletal: Full range of motion in all 4 extremities. No joint swelling    Prior neuro assessment is c/w today's exam 12/02/2023.   Assessment/Plan: 1. Functional deficits which require 3+ hours per day of interdisciplinary therapy in a comprehensive inpatient rehab setting. Physiatrist is providing close team supervision and 24 hour management of active medical problems listed below. Physiatrist and rehab team continue to assess barriers to discharge/monitor patient progress toward functional and medical goals  Care Tool:  Bathing    Body parts bathed by patient: Left lower leg, Right arm, Left arm, Chest, Abdomen, Front perineal area, Right upper leg, Left upper leg   Body parts bathed by helper: Buttocks, Left arm, Right arm, Left upper leg, Left lower leg     Bathing assist Assist Level: Moderate Assistance - Patient 50 - 74%     Upper Body Dressing/Undressing Upper body dressing   What is the patient wearing?: Pull over shirt    Upper body assist Assist Level: Moderate Assistance - Patient 50 - 74%    Lower Body Dressing/Undressing Lower body dressing  What is the patient wearing?: Pants, Underwear/pull up     Lower body assist Assist for lower body dressing: Maximal Assistance - Patient 25 - 49%     Toileting Toileting    Toileting assist Assist for toileting: Maximal Assistance - Patient 25 - 49%     Transfers Chair/bed transfer  Transfers assist     Chair/bed transfer assist level: Moderate Assistance - Patient 50 - 74%     Locomotion Ambulation   Ambulation assist      Assist level: Maximal Assistance - Patient 25 - 49% Assistive device: Other (comment) (R hallway HR) Max distance: 15 ft   Walk 10 feet activity   Assist     Assist level: Maximal Assistance - Patient 25 - 49% Assistive device: Other (comment) (R hallway HR)    Walk 50 feet activity   Assist Walk 50 feet with 2 turns activity did not occur: Safety/medical concerns         Walk 150 feet activity   Assist Walk 150 feet activity did not occur: Safety/medical concerns         Walk 10 feet on uneven surface  activity   Assist Walk 10 feet on uneven surfaces activity did not occur: Safety/medical concerns         Wheelchair     Assist Is the patient using a wheelchair?: Yes Type of Wheelchair: Manual    Wheelchair assist level: Dependent - Patient 0% Max wheelchair distance: 250 ft    Wheelchair 50 feet with 2 turns activity    Assist        Assist Level: Dependent - Patient 0%   Wheelchair 150 feet activity     Assist      Assist Level: Dependent - Patient 0%   Blood pressure 132/85, pulse 99, temperature 98.7 F (37.1 C), temperature source Oral, resp. rate 17, height 5' 11 (1.803 m), weight 84.5 kg, SpO2 100%.  Medical Problem List and Plan: 1. Functional deficits secondary to debility following PEA arrest             -patient may  shower             -ELOS/Goals: 18-21d MinA  -Continue CIR  - Expected discharge 9/2  - Team conference tomorrow  2.  Antithrombotics: -DVT/anticoagulation:  Pharmaceutical: Lovenox              -antiplatelet therapy: N/A due to IPH 3. Pain Management: Tylenol  prn Been on Lyrica  at home for diabetic neuropathic pain. 4. Mood/Behavior/Sleep: LCSW to follow for evaluation and support.              -antipsychotic agents: N/A 5. Neuropsych/cognition: This patient is not capable of making decisions on his own behalf without input from [facility].  - Patient was seen by neuropsychology, appreciate insight 6. Skin/Wound Care: Routine pressure relief measures. Monitor incision for healing.  7. Fluids/Electrolytes/Nutrition: Strict I/O. Renal diet with CM restrictions --no fluid restriction needed 8. PEA arrest X 2/CMV viremia: on Valcyte  900 mg daily thru 10/26 with  monitoring of levels every 2 weeks post completion.  Pharmacy consult  9. Heart transplant: On Gengraf  BID with prednisone  daily             --continue Coreg , lower dose Lipitor  and Zetia .  Bactrim  until 8/13 Heart Failure team will follow -appreciate assistance -Called [place] [facility] heart transplant, cyclosporine  level added for Monday.  -[name] discussed [employer] was contacted about his pacemaker and working correctly.  - 8/18  cyclosporin level down to 274, appears cardiology planning to recheck on 8/21 10. Elevated phosphorous: Will keep on liberalized diet for food choices and to help with intake             --continue Phoslo  1334 tid ac--recheck level in am.  -8/14 Phos mildly elevated today, continue to monitor trend.  Continue PhosLo   Recheck levels  11. Hx seizures: continue Keppra  and Trileptal .   12. 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  Monitor BS ac/hs. Continue Insulin  glargine with novolog  tid ac  CBG (last 3)  Recent Labs    12/01/23 2128 12/02/23 0632 12/02/23 1213  GLUCAP 187* 84 157*  Continue semglee  to 29U, diet changed to carb modified -8/19 last couple days CBGs a little lower in the morning and higher throughout the day, will decrease Semglee  to 26 units but increase mealtime insulin  from 5 units to 6 units.  13. Anemia of chronic disease: Monitor H/H for stability.   -8/14 Hemoglobin stable at 9.6  14. R hand numbness: Possible carpal tunnel  -continue carpal tunnel wrist splint use at night, currently unable to use it due to IV site  - Consider outpatient EMG  15, Diastolic hypertension: 2grams IV mag ordered, coreg  increased to 25mg  BID -8/18 BP stable this morning Addendum BP appears elevated this evening, per chart review appears patient noted to be more lethargic and complaining of chest discomfort.  Appears PA has contacted cardiology and rapid response has been called.  - 8/19 cardiology has  adjusted losartan  dose, possible increase again tomorrow.  Appreciate assistance  16.  Hypomagnesemia.  - 8/18 patient received IV mag over the weekend, started on mag oxide 400 mg daily by cardiology  - 8/19 IV magnesium  has been ordered by cardiology  LOS: 8 days A FACE TO FACE EVALUATION WAS PERFORMED  Murray Collier 12/02/2023, 2:48 PM

## 2023-12-02 NOTE — Progress Notes (Signed)
 Advanced Heart Failure Rounding Note  Cardiologist: Maude Emmer, MD   Chief Complaint: OHT, assistance with immunosuppression  Subjective:    Rapid called yesterday for elevated BP, lethargy and pain around his chest.   This morning BP remains elevated in 130s. No chest pain. Resting comfortably in bed.   Objective:   Weight Range: 84.5 kg Body mass index is 25.98 kg/m.   Vital Signs:   Temp:  [98.2 F (36.8 C)-98.8 F (37.1 C)] 98.2 F (36.8 C) (08/19 0625) Pulse Rate:  [90-104] 90 (08/19 0625) Resp:  [16-20] 18 (08/19 0625) BP: (133-180)/(106-118) 133/106 (08/19 0625) SpO2:  [98 %-100 %] 100 % (08/19 0625) Weight:  [84.5 kg-87.3 kg] 84.5 kg (08/19 0625) Last BM Date : 12/01/23  Weight change: Filed Weights   12/01/23 0537 12/02/23 0500 12/02/23 0625  Weight: 89.4 kg 87.3 kg 84.5 kg   Intake/Output:   Intake/Output Summary (Last 24 hours) at 12/02/2023 0838 Last data filed at 12/02/2023 0500 Gross per 24 hour  Intake 480 ml  Output 825 ml  Net -345 ml     Physical Exam  General:  chronically ill appearing.  No respiratory difficulty Neck: JVD ~6 cm.  Cor: Regular rate & rhythm. No murmurs. Lungs: clear Extremities: no edema  Neuro: alert & oriented x 3. Affect pleasant.   Labs    CBC Recent Labs    12/01/23 0535  WBC 5.0  HGB 9.7*  HCT 30.1*  MCV 88.0  PLT 318   Basic Metabolic Panel Recent Labs    91/83/74 0949 11/30/23 0617 12/01/23 0535 12/02/23 0503  NA 136   < > 142 140  K 4.5   < > 3.8 3.7  CL 103   < > 107 107  CO2 22   < > 25 25  GLUCOSE 230*   < > 105* 93  BUN 17   < > 15 15  CREATININE 0.97   < > 0.95 0.89  CALCIUM  9.5   < > 9.2 9.3  MG 1.5*   < > 1.5* 1.4*  PHOS 4.6  --   --   --    < > = values in this interval not displayed.   Liver Function Tests No results for input(s): AST, ALT, ALKPHOS, BILITOT, PROT, ALBUMIN in the last 72 hours.  No results for input(s): LIPASE, AMYLASE in the last 72  hours. Cardiac Enzymes No results for input(s): CKTOTAL, CKMB, CKMBINDEX, TROPONINI in the last 72 hours.  BNP: BNP (last 3 results) No results for input(s): BNP in the last 8760 hours.  ProBNP (last 3 results) No results for input(s): PROBNP in the last 8760 hours.   D-Dimer No results for input(s): DDIMER in the last 72 hours. Hemoglobin A1C No results for input(s): HGBA1C in the last 72 hours.  Fasting Lipid Panel No results for input(s): CHOL, HDL, LDLCALC, TRIG, CHOLHDL, LDLDIRECT in the last 72 hours. Thyroid  Function Tests No results for input(s): TSH, T4TOTAL, T3FREE, THYROIDAB in the last 72 hours.  Invalid input(s): FREET3  Other results:  Imaging   No results found.   Medications:   Scheduled Medications:  atorvastatin   10 mg Oral Q supper   budesonide  (PULMICORT ) nebulizer solution  0.25 mg Nebulization BID   calcium  acetate  1,334 mg Oral TID WC   carvedilol   25 mg Oral BID WC   cycloSPORINE  modified  100 mg Oral BID   ezetimibe   10 mg Oral Daily   heparin  injection (subcutaneous)  5,000 Units Subcutaneous Q8H   insulin  aspart  0-5 Units Subcutaneous QHS   insulin  aspart  0-9 Units Subcutaneous TID WC   insulin  aspart  5 Units Subcutaneous TID WC   insulin  glargine-yfgn  29 Units Subcutaneous Q2200   levETIRAcetam   1,500 mg Oral BID   lidocaine   2 patch Transdermal Q24H   magnesium  oxide  400 mg Oral Daily   melatonin  3 mg Oral QHS   OXcarbazepine   300 mg Oral Daily   OXcarbazepine   600 mg Oral QHS   pantoprazole   40 mg Oral Daily   predniSONE   5 mg Oral Q breakfast   pregabalin   75 mg Oral QHS   Ensure Max Protein  11 oz Oral BID   protein supplement  1 Scoop Oral TID WC   traZODone   50 mg Oral QHS   valGANciclovir   900 mg Oral Daily    Infusions:    PRN Medications: acetaminophen , alum & mag hydroxide-simeth, bisacodyl , diclofenac  Sodium, diphenhydrAMINE , guaiFENesin -dextromethorphan,  ipratropium-albuterol , prochlorperazine  **OR** prochlorperazine  **OR** prochlorperazine , senna-docusate, sodium phosphate Patient Profile   38 y.o. 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   Transferred to CIR 11/24/23 after prolonged hospitalization at [facility] w/ CMV viremia c/b PEA arrest, subsequent [facility] ECMO cannulation >>VV ECMO and eventual trach. Assessment/Plan  HFrEF d/t NICM s/p OHT 8/24 - s/p OHT 11/26/22. Last EMBx in 3/25 negative with normal EF.  - TTE pre-arrest with EF 40% and severely reduced RV function - s/p PEA arrest (see below) - RHC 10/15/23 with normal CI and elevated PCWP. EMBx taken 7/2 - TTE 7/29 with normal biV function.  - Completed Bactrim   11/26/23 - continue prednisone  5 mg daily - continue atorva 10 mg at bedtime + zetia  10 mg daily - continue coreg  25 mg twice a day.   - Start losartan  25 mg daily - CSA level 346 (goal 150-200). D/w [facility] team and PharmD. On lower dose of 100 bid. Recheck trough 1 week. Discussed with pharmacy.             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 - induced with ganciclovir - continue valganciclovir  900 mg daily (plan for 3 months) - no change   PEA arrest on 10/01/23 - s/p [facility] ECLS transitioned to VV ECMO 6/21. De-cannulated 6/25. Trach placed.   - presumed respiratory etiology, possibly d/t unwitnessed seizure - ESBL Kleb HCAP s/p meropenem  - trach de-cannulated 8/4   H/o CVA - stroke with hemorrhagic conversion in 2022 and again in 4/25 - residual L sided deficits and seizures - continue keppra  and trileptal   - Appreciate CIR rehab  HTN - changes as above.    Length of Stay: 8  Beckey LITTIE Coe, NP  12/02/2023, 8:38 AM  Advanced Heart Failure Team Pager [phone] (M-F; 7a - 5p)  Please contact [facility] for night-coverage after hours  (5p -7a ) and weekends on [email or web address]

## 2023-12-02 NOTE — Progress Notes (Signed)
 Speech Language Pathology Weekly Progress and Session Note  Patient Details  Name: Jason Stevenson MRN: [id number] Date of Birth: 07/20/1985  Beginning of progress report period: November 25, 2023 End of progress report period: December 02, 2023  Today's Date: 12/02/2023 SLP Individual Time: 9099-9041 SLP Individual Time Calculation (min): 58 min  Short Term Goals: Week 1: SLP Short Term Goal 1 (Week 1): Patient will increase vocal intensity during conversation to reach 100% intelligibility given min verbal A SLP Short Term Goal 1 - Progress (Week 1): Met SLP Short Term Goal 2 (Week 1): Patient will recall and utilize memory strategies given min multimodal A SLP Short Term Goal 2 - Progress (Week 1): Met SLP Short Term Goal 3 (Week 1): Patient will demonstrate sustained attention to functional tasks given min multimodal A SLP Short Term Goal 3 - Progress (Week 1): Met SLP Short Term Goal 4 (Week 1): Patient will demonstrate mildly complex problem solving skills given min multimodal A SLP Short Term Goal 4 - Progress (Week 1): Met    New Short Term Goals: Week 2: SLP Short Term Goal 1 (Week 2): Patient will increase vocal intensity during conversation to reach 100% intelligibility given supervision verbal A SLP Short Term Goal 2 (Week 2): Patient will recall and utilize memory strategies given supervision multimodal A SLP Short Term Goal 3 (Week 2): Patient will demonstrate sustained attention to functional tasks given supervision multimodal A SLP Short Term Goal 4 (Week 2): Patient will demonstrate mildly complex problem solving skills given supervision multimodal A  Weekly Progress Updates: Pt has made excellent gains and has met 4 of 4 STGs this reporting period due to improved communication and cognition. Currently, patient continues to require min A for increasing vocal intensity, memory, sustained attention and problem solving.  Pt/family eduction ongoing. Pt would benefit from  continued ST intervention to maximize communication and cognition in order to maximize functional independence at d/c.    Intensity: Minumum of 1-2 x/day, 30 to 90 minutes Frequency: 1 to 3 out of 7 days Duration/Length of Stay: 9/2 Treatment/Interventions: Cognitive remediation/compensation;Cueing hierarchy;Functional tasks;Internal/external aids;Multimodal communication approach;Speech/Language facilitation;Therapeutic Activities   Daily Session  Skilled Therapeutic Interventions:  Skilled therapy session focused on communication and cognitive goals. SLP targeted cognition through abstract word puzzle task. Patient required minA to complete puzzle with benefited from increased processing time. Patient recalled 4/4 WRAP memory strategies independently. SLP targeted communication by providing minA for patient to increase vocal intensity at the conversational level and complete x25 repetitions of EMST set at 40cm H2O. Patient left in bed with alarm set and call bell in reach. Continue POC.     Pain Leg 3/10  Therapy/Group: Individual Therapy  Leah Skora M.A., CCC-SLP 12/02/2023, 7:57 AM

## 2023-12-02 NOTE — Progress Notes (Signed)
 Occupational Therapy Session Note  Patient Details  Name: Jason Stevenson MRN: [id number] Date of Birth: Mar 15, 1986  Today's Date: 12/02/2023 OT Individual Time: 1046-1200 OT Individual Time Calculation (min): 74 min    Short Term Goals: Week 1:  OT Short Term Goal 1 (Week 1): Pt wil recall hemi-dressing techniques with MIN questioning cues OT Short Term Goal 2 (Week 1): Pt will maintain L UE in safety position during ADL and functional transfers with MIN A OT Short Term Goal 3 (Week 1): Pt will complete LB dressing with MOD A OT Short Term Goal 4 (Week 1): Pt will complete 1/3 steps of toileting with MIN A  Skilled Therapeutic Interventions/Progress Updates:     Pt received semi-reclined in bed presenting to be in good spirits receptive to skilled OT session reporting 3/10 pain in his upper traps- OT offering intermittent rest breaks, repositioning, and therapeutic support to optimize participation in therapy session. Pt requesting to take shower- focused this session on ADL retraining to increase overall independence and decrease bourdon of care. Pt with slowed initiation and decreased attention throughout session requiring increased verbal cues for redirection to task and increased time to complete ADLs.   Mobility:  -Bed Mobility: Pt transitioned to EOB MOD A +increased time using leg lifter to bring L LE to EOB, MAX verbal cues for technique.  -STEDY transfer: Utilized STEDY for transfers EOB > TTB > WC to facilitate increased opportunities to work on sit<>stands and standing balance. MIN A required to stand from all surfaces this session +verbal cues for technique and L UE positioning.   ADL:  -Bathing: U/LB bathing completed in seated position on TTB for energy conservation and safety. Pt presenting with lateral lean to L when fatigued, however able to correct with verbal cues and able to demonstrate adequate dynamic sitting balance to lean towards ground when using reacher to wash  feet. He did stand in STEDY x1 trial when washing buttocks- able to maintain standing balance with MIN A while OT assisted with washing peri-areas. Worked on lateral leans in shower to allow Pt to wash buttocks, however he was not safe doing this during session d/t fatigue and decreased safety awareness.  -Dressing: U/LB dressing completed seated in wc using hemi-techniques. Able to recall hemi-technique sequence following single questioning cue donning OH shirt with MOD A and pants MAX A overall. He utilized Sports administrator to [employer] into pants, however d/t Pt's cognitive deficits he has increased challenges problem solving positioning of feet/reacher requiring maximal verbal cues +increased time.   Pt was left resting in wc with call bell in reach, seatbelt alarm on, and all needs met.    Therapy Documentation Precautions:  Precautions Precautions: Fall Precaution/Restrictions Comments: L Hemiplegia Restrictions Weight Bearing Restrictions Per Provider Order: No   Therapy/Group: Individual Therapy  [name] 12/02/2023, 8:06 AM

## 2023-12-02 NOTE — Progress Notes (Signed)
 Physical Therapy Session Note  Patient Details  Name: Jason Stevenson MRN: [id number] Date of Birth: [birth year]  Today's Date: 12/02/2023 PT Individual Time: 1345-1455 PT Individual Time Calculation (min): 70 min  Short Term Goals: Week 1:  PT Short Term Goal 1 (Week 1): Pt willperform bed mobility with consistent ModA overall. PT Short Term Goal 2 (Week 1): Pt will perform sit<>stand transfers with no AD and modA. PT Short Term Goal 3 (Week 1): Pt will perform squat pivot transfers with MinA. PT Short Term Goal 4 (Week 1): Pt will perform stand pivot transfers with ModA +1. PT Short Term Goal 5 (Week 1): Pt will perform ambulation with MaxA +1 using RW for at least 25 ft.   Skilled Therapeutic Interventions/Progress Updates:  Patient supine in bed on entrance to room. Patient alert and not initially agreeable to PT session. Relates extreme fatigue and has IV Mg treatment running. Notified RN re: Mg tx completed. Pt requires add'l time and cues today to focus and maintain attn to task d/t noted fatigue. Very distractible.   Patient with no pain complaint at start of session.  Therapeutic Activity: Bed Mobility: Pt performed supine > sit requiring extra time and max vc/ tc in order to increase focus to movements required to reach seated position on EOB. Pt easily distracted verbally and visually. Continued redirection needed for attn to task of bringing LLE to EOB  and using trunk/ pelvis in assist to complete to bring off of bed. Requires ModA to complete.  Transfers: Pt relates need to have BM. STEDY used for quick transfer to toilet. Is able to rise to stand with pull on bar with MaxA for power up and then CGA/ MinA to complete. MaxA for clothing mgmt. MinA to slowly lower to toilet. Time required to complete toileting. Is able to perform pericare with toilet paper but requests assist with washcloth.  MaxA for pericare with wet washcloth.   Is able to perform blocked practice of  sit<>stand from perch seat of STEDY for improved motor control and focus to required activation of LLE to complete. With RUE support initially and then with no UE support. Cga with MinA when fatigued.   Wheelchair Mobility:  Pt propelled wheelchair 15 feet with focus to lift and motor control of LLE with max vc and tc to maintain smooth reciprocation. Without cues for effort and sequencing is very distracted and cannot lift LLE. Requires MaxA initially for LLE and then improves to MinA.   Neuromuscular Re-ed: NMR facilitated during session with focus on standing balance, motor control, and focus. Pt guided in blocked practice of sit<>stand from slightly elevated EOB. Continues compensatory movement with shift over RLE and requires guard/ block to L knee with vc to maintain knee extension. Has difficulty with maintaining and requires up to Mod/ MaxA to maintain upright posture with weight shift over LLE. Then guided in minisquats while providing up to ModA to maintain midline throughout.   NMR performed for improvements in motor control and coordination, balance, sequencing, judgement, and self confidence/ efficacy in performing all aspects of mobility at highest level of independence.   Patient supine in bed at end of session with brakes locked, bed alarm set, and all needs within reach.   Therapy Documentation Precautions:  Precautions Precautions: Fall Precaution/Restrictions Comments: L Hemiplegia Restrictions Weight Bearing Restrictions Per Provider Order: No  Pain:  No pain related this session.   Therapy/Group: Individual Therapy  [name] PT, DPT, CSRS 12/02/2023, 1:47 PM

## 2023-12-02 NOTE — Plan of Care (Signed)
  Problem: Consults Goal: RH GENERAL PATIENT EDUCATION Description: See Patient Education module for education specifics. Outcome: Progressing   Problem: RH BOWEL ELIMINATION Goal: RH STG MANAGE BOWEL WITH ASSISTANCE Description: STG Manage Bowel with mod I Assistance. Outcome: Progressing Goal: RH STG MANAGE BOWEL W/MEDICATION W/ASSISTANCE Description: STG Manage Bowel with Medication with mod I Assistance. Outcome: Progressing   Problem: RH BLADDER ELIMINATION Goal: RH STG MANAGE BLADDER WITH ASSISTANCE Description: STG Manage Bladder With mod I Assistance Outcome: Progressing   Problem: RH SKIN INTEGRITY Goal: RH STG SKIN FREE OF INFECTION/BREAKDOWN Description: Manage w min assist Outcome: Progressing   Problem: RH SAFETY Goal: RH STG ADHERE TO SAFETY PRECAUTIONS W/ASSISTANCE/DEVICE Description: STG Adhere to Safety Precautions With cues Assistance/Device. Outcome: Progressing   Problem: RH PAIN MANAGEMENT Goal: RH STG PAIN MANAGED AT OR BELOW PT'S PAIN GOAL Description: Pain < 4 with prns Outcome: Progressing   Problem: RH KNOWLEDGE DEFICIT GENERAL Goal: RH STG INCREASE KNOWLEDGE OF SELF CARE AFTER HOSPITALIZATION Description: Patient and wife will be able to manage care at discharge using educational resources independently Outcome: Progressing   Problem: Education: Goal: Ability to describe self-care measures that may prevent or decrease complications (Diabetes Survival Skills Education) will improve Outcome: Progressing Goal: Individualized Educational Video(s) Outcome: Progressing   Problem: Coping: Goal: Ability to adjust to condition or change in health will improve Outcome: Progressing   Problem: Fluid Volume: Goal: Ability to maintain a balanced intake and output will improve Outcome: Progressing   Problem: Health Behavior/Discharge Planning: Goal: Ability to identify and utilize available resources and services will improve Outcome:  Progressing Goal: Ability to manage health-related needs will improve Outcome: Progressing   Problem: Metabolic: Goal: Ability to maintain appropriate glucose levels will improve Outcome: Progressing   Problem: Nutritional: Goal: Maintenance of adequate nutrition will improve Outcome: Progressing Goal: Progress toward achieving an optimal weight will improve Outcome: Progressing   Problem: Skin Integrity: Goal: Risk for impaired skin integrity will decrease Outcome: Progressing   Problem: Tissue Perfusion: Goal: Adequacy of tissue perfusion will improve Outcome: Progressing

## 2023-12-03 DIAGNOSIS — R200 Anesthesia of skin: Secondary | ICD-10-CM | POA: Diagnosis not present

## 2023-12-03 DIAGNOSIS — I10 Essential (primary) hypertension: Secondary | ICD-10-CM | POA: Diagnosis not present

## 2023-12-03 DIAGNOSIS — R5381 Other malaise: Secondary | ICD-10-CM | POA: Diagnosis not present

## 2023-12-03 DIAGNOSIS — Z941 Heart transplant status: Secondary | ICD-10-CM | POA: Diagnosis not present

## 2023-12-03 DIAGNOSIS — R790 Abnormal level of blood mineral: Secondary | ICD-10-CM | POA: Diagnosis not present

## 2023-12-03 DIAGNOSIS — I5020 Unspecified systolic (congestive) heart failure: Secondary | ICD-10-CM | POA: Diagnosis not present

## 2023-12-03 LAB — BASIC METABOLIC PANEL WITH GFR
Anion gap: 9 (ref 5–15)
BUN: 16 mg/dL (ref 6–20)
CO2: 26 mmol/L (ref 22–32)
Calcium: 9.1 mg/dL (ref 8.9–10.3)
Chloride: 106 mmol/L (ref 98–111)
Creatinine, Ser: 0.97 mg/dL (ref 0.61–1.24)
GFR, Estimated: >60 mL/min (ref >60)
Glucose, Bld: 179 mg/dL — ABNORMAL HIGH (ref 70–99)
Potassium: 3.7 mmol/L (ref 3.5–5.1)
Sodium: 141 mmol/L (ref 135–145)

## 2023-12-03 LAB — GLUCOSE, CAPILLARY
Glucose-Capillary: 141 mg/dL — ABNORMAL HIGH (ref 70–99)
Glucose-Capillary: 156 mg/dL — ABNORMAL HIGH (ref 70–99)
Glucose-Capillary: 138 mg/dL — ABNORMAL HIGH (ref 70–99)
Glucose-Capillary: 208 mg/dL — ABNORMAL HIGH (ref 70–99)

## 2023-12-03 LAB — MAGNESIUM: Magnesium: 1.6 mg/dL — ABNORMAL LOW (ref 1.7–2.4)

## 2023-12-03 MED ORDER — LOSARTAN POTASSIUM 50 MG PO TABS: 50.0000 mg | ORAL_TABLET | Freq: Every day | ORAL | Status: DC

## 2023-12-03 MED ORDER — LOSARTAN POTASSIUM 50 MG PO TABS
50.0000 mg | ORAL_TABLET | Freq: Every day | ORAL | Status: DC
  Administered 2023-12-03 – 2023-12-05 (×3): 50 mg via ORAL
  Filled 2023-12-03 (×3): qty 1

## 2023-12-03 NOTE — Progress Notes (Signed)
 Patient ID: Jason Stevenson, male   DOB: [birth year], 38 y.o.   MRN: [id number]  Met with pt and spoke with wife via telephone to discuss team conference goals of min-mod level and target discharge date of 9/2. She was assisting with his care prior to admission after CIR in [place]. Discussed coming in for hands on education next week and have scheduled for next Sat 8/30 1:00-4:00. She reports [facility] was to start home health but pt was hospitalized before this began. Will reach out to [facility] liaison to see if can still see him once discharged. Will await equipment needs and work on discharge needs.

## 2023-12-03 NOTE — Patient Care Conference (Signed)
 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 Murray Collier Social Worker Present: Rhoda Clement, LCSW Nurse Present: Barnie Ronde, RN PT Present: Recardo Milliner, PT OT Present: Katheryn Mines, OT SLP Present: Blaise Alderman, SLP PPS Coordinator present : Eleanor Colon, SLP     Current Status/Progress Goal Weekly Team Focus  Bowel/Bladder     Continent of bowel and bladder           Swallow/Nutrition/ Hydration              ADL's   UB dressing MOD A, LB dressing MAX A, squat pivot transfers heavy MOD A, bathing MOD A, and toileting MAX A // Barriers: slowed processing, slow task initiation, functional cognition, fatigue, hemiplegia   MIN A- will continue to assess as Pt continues to progress, hopefully can upgrade to CGA   Pt education, L NMRE, functional transfer training, ADL retraining    Mobility   Bed mobility = Mod/ MaxA, Transfers = Mod/MaxA, Ambulation = MaxA for 20' using hallway HR   overall MinA for mobility, CGA for transfers - may need to be downgraded  Barriers: multiple recent comorbidities, decreased motor planning/ control, decreased focus /// Work On: NMR L hemibody, seated/ standing balance, gait training, motor  control, strengthening, pt/ family education    Communication   low vocal intensity   supervision A   EMST, SLOP strategies    Safety/Cognition/ Behavioral Observations  supervision-minA   supervisionA   memory, problem solving, attention    Pain      Left shoulder pain   Pain < 4 with prns     Assess pain q shift and effectiveness of prn meds  Skin      N/a            Discharge Planning:  Pt progressing in therpaies, wife will be main caregiver and will be going to sister's home upon discharge. Await team's recommendations   Team Discussion: Patient admitted with debility post diabetic neuropathy, CVA, heart transplant with new CMV and remote ICH with left hemiparesis. Limited by left shoulder subluxation, pain, slow processing, poor motor planning and task initiation, poor balance, decreased attention; requiring constant cues to keep on task and perseverative behavior.   Patient on target to meet rehab goals: yes, currently needs mod assist for upper body ADLs and max assist for lower body ADLs using hemi body techniques and mod assist for bathing. Needs max assist for toileting. Needs mod - max assist for bed mobility and min - mod assist for squat pivot transfers. Able to ambulate at the rail with max assist. Goals for discharge set for min assist overall.  *See Care Plan and progress notes for long and short-term goals.  Revisions to Treatment Plan:  Cardiology following Right hand brace EMST   Teaching Needs: Safety, medications, transfers, toileting, etc.   Current Barriers to Discharge: Decreased caregiver support and Home enviroment access/layout  Possible Resolutions to Barriers: Family education     Medical Summary Current Status: Debility, Heart transplant, DM2, R hand numbness, HTN, Low MG  Barriers to Discharge: Electrolyte abnormality;Uncontrolled Pain;Medical stability;Self-care education;Cardiac Complications  Barriers to Discharge Comments:  Debility, Heart transplant, DM2, R hand numbness, HTN, Low MG Possible Resolutions to [employer] Focus: Check labs tomorrow, adjust BP medications, Adjust insulin , mg supplementation   Continued Need for Acute Rehabilitation Level of Care: The patient requires daily medical management by a physician with specialized training in physical medicine and rehabilitation for the following reasons: Direction of a multidisciplinary physical rehabilitation program to maximize functional independence : Yes Medical management of patient stability for increased activity during participation in an intensive rehabilitation regime.: Yes Analysis of laboratory values and/or radiology reports with any subsequent need for medication adjustment and/or medical intervention. : Yes   I attest that I was present, lead the team conference, and concur with the assessment and plan of the team.   Fredericka Sober B 12/03/2023, 2:20 PM

## 2023-12-03 NOTE — Progress Notes (Signed)
 Advanced Heart Failure Rounding Note  Cardiologist: Maude Emmer, MD  HF Cardiologist: Dr. Rolan Chief Complaint: OHT, Assistance with immunosuppression Subjective:    Bp remains slightly elevated.  Weight stable.   Working with PT. Feeling well.   Objective:    Weight Range: 86.6 kg Body mass index is 26.63 kg/m.   Vital Signs:   Temp:  [98.2 F (36.8 C)-98.7 F (37.1 C)] 98.2 F (36.8 C) (08/20 0540) Pulse Rate:  [88-103] 88 (08/20 0540) Resp:  [17-19] 19 (08/20 0540) BP: (132-147)/(84-89) 147/84 (08/20 0540) SpO2:  [98 %-100 %] 100 % (08/20 0540) Weight:  [86.6 kg] 86.6 kg (08/20 0500) Last BM Date : 12/02/23  Weight change: Filed Weights   12/02/23 0500 12/02/23 0625 12/03/23 0500  Weight: 87.3 kg 84.5 kg 86.6 kg   Intake/Output:  Intake/Output Summary (Last 24 hours) at 12/03/2023 0924 Last data filed at 12/03/2023 0856 Gross per 24 hour  Intake 838 ml  Output 600 ml  Net 238 ml    Physical Exam   General: Well appearing. No distress on RA Cardiac: JVP flat. S1 and S2 present. No murmurs or rub. Extremities: Warm and dry.  No edema.  Neuro: Alert and oriented x3. Affect pleasant.  Labs    CBC Recent Labs    12/01/23 0535  WBC 5.0  HGB 9.7*  HCT 30.1*  MCV 88.0  PLT 318   Basic Metabolic Panel Recent Labs    91/80/74 0503 12/03/23 0535  NA 140 141  K 3.7 3.7  CL 107 106  CO2 25 26  GLUCOSE 93 179*  BUN 15 16  CREATININE 0.89 0.97  CALCIUM  9.3 9.1  MG 1.4* 1.6*   Medications:    Scheduled Medications:  atorvastatin   10 mg Oral Q supper   budesonide  (PULMICORT ) nebulizer solution  0.25 mg Nebulization BID   calcium  acetate  1,334 mg Oral TID WC   carvedilol   25 mg Oral BID WC   cycloSPORINE  modified  100 mg Oral BID   ezetimibe   10 mg Oral Daily   heparin  injection (subcutaneous)  5,000 Units Subcutaneous Q8H   insulin  aspart  0-5 Units Subcutaneous QHS   insulin  aspart  0-9 Units Subcutaneous TID WC   insulin  aspart  6  Units Subcutaneous TID WC   insulin  glargine  26 Units Subcutaneous QHS   levETIRAcetam   1,500 mg Oral BID   lidocaine   2 patch Transdermal Q24H   losartan   50 mg Oral Daily   magnesium  oxide  400 mg Oral BID   melatonin  3 mg Oral QHS   OXcarbazepine   300 mg Oral Daily   OXcarbazepine   600 mg Oral QHS   pantoprazole   40 mg Oral Daily   predniSONE   5 mg Oral Q breakfast   pregabalin   75 mg Oral QHS   Ensure Max Protein  11 oz Oral BID   protein supplement  1 Scoop Oral TID WC   traZODone   50 mg Oral QHS   valGANciclovir   900 mg Oral Daily   Infusions:  PRN Medications: acetaminophen , alum & mag hydroxide-simeth, bisacodyl , diclofenac  Sodium, diphenhydrAMINE , guaiFENesin -dextromethorphan, ipratropium-albuterol , prochlorperazine  **OR** prochlorperazine  **OR** prochlorperazine , senna-docusate, sodium phosphate  Patient Profile   38 y.o. 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   Transferred to CIR 11/24/23 after prolonged hospitalization at [facility] w/ CMV viremia  c/b PEA arrest, subsequent [facility] ECMO cannulation >>VV ECMO and eventual trach.  Assessment/Plan   HFrEF d/t NICM s/p OHT 8/24 - s/p OHT 11/26/22. Last EMBx in 3/25 negative with normal EF.  - TTE pre-arrest with EF 40% and severely reduced RV function - s/p PEA arrest (see below) - RHC 10/15/23 with normal CI and elevated PCWP. EMBx taken 7/2 - TTE 7/29 with normal biV function.  - Completed Bactrim   11/26/23 - continue prednisone  5 mg daily - continue atorva 10 mg at bedtime + zetia  10 mg daily - continue coreg  25 mg bid - increase losartan  to 50 mg daily - CSA level 346 (goal 150-200). D/w [facility] team and PharmD. On lower dose of 100 bid. Recheck trough 1 week. Discussed with pharmacy.             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 - induced  with ganciclovir - continue valganciclovir  900 mg daily (plan for 3 months) - no change   PEA arrest on 10/01/23 - s/p VA ECLS transitioned to VV ECMO 6/21. De-cannulated 6/25. Trach placed.   - presumed respiratory etiology, possibly d/t unwitnessed seizure - ESBL Kleb HCAP s/p meropenem  - trach de-cannulated 8/4   H/o CVA - stroke with hemorrhagic conversion in 2022 and again in 4/25 - residual L sided deficits and seizures - continue keppra  and trileptal   - Appreciate CIR rehab  HTN - changes as above.    Length of Stay: 9  [place] Kamaury Cutbirth, NP  12/03/2023, 9:24 AM  Advanced Heart Failure Team Pager [phone] (M-F; 7a - 5p)  Please contact [facility] for night-coverage after hours (5p -7a ) and weekends on [email or web address]

## 2023-12-03 NOTE — Progress Notes (Signed)
 Occupational Therapy Weekly Progress Note  Patient Details  Name: Jason Stevenson MRN: [id number] Date of Birth: 07-18-1985  Beginning of progress report period: November 25, 2023 End of progress report period: December 03, 2023  Today's Date: 12/03/2023 OT Individual Time: 9151-9055 OT Individual Time Calculation (min): 56 min  And OT Individual Time: 1416-1500 OT Individual Time Calculation (min): 44 min   Patient has met 3 of 4 short term goals. Jason Stevenson is making slow progress towards reaching his LTGs. He is motivated to participate in therapy sessions, however has been limited by functional cognition with slow initiation, decreased carryover between sessions, decreased attention, and increased time required for processing. He is completing UB dressing MOD A, LB dressing MAX A, squat pivot transfers heavy MOD A, bathing MOD A, and toileting MAX A. He is planning to d/c home with his wife who can provide 24/7 physical assistance and supervision. Pt's wife is planning to participate in family education sessions prior ot d/c.   Patient continues to demonstrate the following deficits: muscle weakness and muscle joint tightness, decreased cardiorespiratoy endurance, impaired timing and sequencing, abnormal tone, unbalanced muscle activation, decreased coordination, and decreased motor planning, decreased attention to left and decreased motor planning, decreased initiation, decreased attention, decreased awareness, decreased problem solving, decreased safety awareness, decreased memory, and delayed processing, central origin, and decreased sitting balance, decreased standing balance, decreased postural control, hemiplegia, and decreased balance strategies and therefore will continue to benefit from skilled OT intervention to enhance overall performance with BADL and Reduce care partner burden.  Patient progressing toward long term goals..  Continue plan of care.  OT Short Term Goals Week 1:   OT Short Term Goal 1 (Week 1): Pt wil recall hemi-dressing techniques with MIN questioning cues OT Short Term Goal 1 - Progress (Week 1): Met OT Short Term Goal 2 (Week 1): Pt will maintain L UE in safety position during ADL and functional transfers with MIN A OT Short Term Goal 2 - Progress (Week 1): Met OT Short Term Goal 3 (Week 1): Pt will complete LB dressing with MOD A OT Short Term Goal 3 - Progress (Week 1): Progressing toward goal OT Short Term Goal 4 (Week 1): Pt will complete 1/3 steps of toileting with MIN A OT Short Term Goal 4 - Progress (Week 1): Met Week 2:  OT Short Term Goal 1 (Week 2): Pt will weave B LEs into pants using AE PRN with MIN A OT Short Term Goal 2 (Week 2): Pt will assisting in bringing pants to waist with MIN A provided for standing balance OT Short Term Goal 3 (Week 2): Pt will complete squat pivot transfers with MIN A OT Short Term Goal 4 (Week 2): Pt will complete UB dressing with MIN A  Skilled Therapeutic Interventions/Progress Updates:     AM Session: Pt received sitting up in bed presenting to be in good spirits receptive to skilled OT session reporting 0/10 pain- OT offering intermittent rest breaks, repositioning, and therapeutic support to optimize participation in therapy session. Focused this session on ADL retraining, Pt education, and w/c propulsion. Pt able to be more easily redirected to tasks this session and able to attend to functional tasks for increased amount of time.   Mobility:  -Bed Mobility: With HOB elevated, Pt able to utilize leg lifter with bring L LE to EOB with MAX verbal cues for technique and motivation to continue task +increased time. Utilized bed rail to lift trunk completing bed mobility with CGA overall  this session. Improved L LE and carryover of use of leg lifter noted this session.  -Squat pivot: Pt completed squat pivot EOB > WC to L with light MOD A this session +MAX verbal cues to recall technique and body positioning  for set-up.  -W/c mobility: Provided education on hemi-propulsion technique with demonstration and verbal instructions provided. Engaged Pt in completing hemi-propulsion navigating around obstacles with Pt able to complete with SUP-MIN A to avoid obstacles on L. Slowed pace noted, would benefit from increased practice opportunities to increase overall independence with w/c mobility.   ADL:  -Dressing: U/LB dressing completed sitting EOB for increased dynamic sitting balance challenge. Pt able to doff [place] shirt this session with SUP +increased time and verbal cues for technique. OT assisted with set-up of shirt- Pt then able to weave L UE into shirt with MIN A to bring it over shoulder, weave R UE, and bring overhead with assistance to avoid shirt getting caught on his hair. Pt crossed L LE into modified figure-four position to weave L LE into pants and utilized reacher to weave R LE. He then stood to [name] with MIN A and maintained standing balance MIN/MOD A with OT stabilizing L LE while bringing pants to waist- able to assist with pulling up R side of pants.  -Grooming/hygiene: Pt completed oral care and washed face sitting at sink using hemi-techniques with min verbal cues provided to implement technique when donning toothpaste.   Pt was left resting in wc with call bell in reach, seatbelt alarm on, and all needs met.    PM Session:  Pt received *** presenting to be in good spirits receptive to skilled OT session reporting ***/10 pain- OT offering intermittent rest breaks, repositioning, and therapeutic support to optimize participation in therapy session.   Handed off from Pt  [name] in standing   E-stim   Pt was left resting in *** with call bell in reach, *** alarm on, and all needs met.    Therapy Documentation Precautions:  Precautions Precautions: Fall Precaution/Restrictions Comments: L Hemiplegia Restrictions Weight Bearing Restrictions Per Provider Order:  No   Therapy/Group: Individual Therapy  Jason Stevenson 12/03/2023, 7:20 AM

## 2023-12-03 NOTE — Progress Notes (Incomplete)
 Physical Therapy Weekly Progress Note  Patient Details  Name: Jason Stevenson MRN: [id number] Date of Birth: 1985/09/01  Beginning of progress report period: November 25, 2023 End of progress report period: December 03, 2023  Today's Date: 12/03/2023 PT Individual Time: 1302-1420 PT Individual Time Calculation (min): 78 min   Patient has met {number 1-5:22450} of {number 1-5:20334} short term goals.  Pt making appropriate progress towards goals and is on track to meet LTG. She has/ has not*** missed a few days of therapy due to ***. She completes bed mobility with ***, sit<>stand transfers with ***, and stand<>pivot transfers with *** using ***. She's ambulating ***ft with *** using ***. She has also shown ability to navigate *** 6-in steps with *** using *** handrails. She continues to be primarily limited by premorbid deconditioning, body habitus, decreased dynamic standing balance, *UE sensory deficits, RUE and RLE weakness, and gait impairments. Caregiver/ family *** has/ have not*** participated in family education to prepare for d/c home.   Patient continues to demonstrate the following deficits muscle weakness, decreased cardiorespiratoy endurance, impaired timing and sequencing, abnormal tone, unbalanced muscle activation, and decreased coordination, decreased midline orientation, decreased attention to left, and decreased motor planning, decreased initiation, decreased awareness, decreased problem solving, decreased safety awareness, and delayed processing, and decreased sitting balance, decreased standing balance, hemiplegia, and decreased balance strategies and therefore will continue to benefit from skilled PT intervention to increase functional independence with mobility.  Patient progressing toward long term goals..  Plan of care revisions: ***.  PT Short Term Goals Week 1:  PT Short Term Goal 1 (Week 1): Pt willperform bed mobility with consistent ModA overall. PT Short Term Goal 2  (Week 1): Pt will perform sit<>stand transfers with no AD and modA. PT Short Term Goal 3 (Week 1): Pt will perform squat pivot transfers with MinA. PT Short Term Goal 4 (Week 1): Pt will perform stand pivot transfers with ModA +1. PT Short Term Goal 5 (Week 1): Pt will perform ambulation with MaxA +1 using RW for at least 25 ft. Week 2:     Skilled Therapeutic Interventions/Progress Updates:  Patient *** on entrance to room. Patient alert and agreeable to PT session.   Patient with no pain complaint at start of session.  Therapeutic Activity: Bed Mobility: Pt performed supine <> sit with ***. VC/ tc required for ***. Transfers: Pt performed sit<>stand and stand pivot transfers throughout session with ***. Provided vc/ tc for***.  Gait Training:  Pt ambulated *** ft using *** with ***. Demonstrated ***. Provided vc/ tc for ***.  Wheelchair Mobility:  Pt propelled wheelchair *** feet with ***. Provided vc/ tc for ***.  Neuromuscular Re-ed: NMR facilitated during session with focus on ***. Pt guided in ***. NMR performed for improvements in motor control and coordination, balance, sequencing, judgement, and self confidence/ efficacy in performing all aspects of mobility at highest level of independence.   Therapeutic Exercise: Pt performed the following exercises with vc/ tc for proper technique. ***  Patient *** at end of session with brakes locked, *** alarm set, and all needs within reach.   Therapy Documentation Precautions:  Precautions Precautions: Fall Precaution/Restrictions Comments: L Hemiplegia Restrictions Weight Bearing Restrictions Per Provider Order: No  Pain:      Therapy/Group: Individual Therapy  Jason Stevenson 12/03/2023, 7:02 AM

## 2023-12-03 NOTE — Progress Notes (Addendum)
 PROGRESS NOTE   Subjective/Complaints: Continues to have occasional altered sensation in his right hand.  Reports that can be a little uncomfortable at night.  Reports wrist brace is helpful.   Review of systems: denies fever headache, shortness of breath, abdominal pain, nausea, vomiting, diarrhea, constipation, new motor or sensory changes  + R hand tingling/numbness  Objective:   No results found.  Recent Labs    12/01/23 0535  WBC 5.0  HGB 9.7*  HCT 30.1*  PLT 318    Recent Labs    12/02/23 0503 12/03/23 0535  NA 140 141  K 3.7 3.7  CL 107 106  CO2 25 26  GLUCOSE 93 179*  BUN 15 16  CREATININE 0.89 0.97  CALCIUM  9.3 9.1    Intake/Output Summary (Last 24 hours) at 12/03/2023 1100 Last data filed at 12/03/2023 0856 Gross per 24 hour  Intake 838 ml  Output 600 ml  Net 238 ml        Physical Exam: Vital Signs Blood pressure (!) 147/84, pulse 88, temperature 98.2 F (36.8 C), temperature source Oral, resp. rate 19, height 5' 11 (1.803 m), weight 86.6 kg, SpO2 100%.  General: No acute distress, working with therapy in his room HEENT trach site well healed  Pleasant affect Heart: Regular rate and rhythm no rubs murmurs or extra sounds Lungs: Clear auscultation bilaterally, nonlabored breathing Abdomen: Soft nontender nondistended positive bowel sounds Extremities: No edema noted Skin: No breakdown noted on visible portion  Neurologic: Awake and alert, cranial nerves II through XII grossly intact,   Sensory exam normal sensation to light touch is reduced on the left side below the knee as well as in the left upper extremity  Altered sensation plantar 1st and 2nd digit right hand Musculoskeletal: Full range of motion in all 4 extremities. No joint swelling Minimal right thumb TTP   Prior neuro assessment is c/w today's exam 12/03/2023.   Prior exam  motor strength is 4/5 in right deltoid, bicep,  tricep, grip, hip flexor, knee extensors, ankle dorsiflexor and plantar flexor LUE  2 -/5 left deltoid, bicep, tricep, finger flexors and trace extensors. Left lower extremity 3 - hip flexor 4 - knee extensor 0 ankle dorsiflexor and plantar flexor,  Assessment/Plan: 1. Functional deficits which require 3+ hours per day of interdisciplinary therapy in a comprehensive inpatient rehab setting. Physiatrist is providing close team supervision and 24 hour management of active medical problems listed below. Physiatrist and rehab team continue to assess barriers to discharge/monitor patient progress toward functional and medical goals  Care Tool:  Bathing    Body parts bathed by patient: Left lower leg, Right arm, Left arm, Chest, Abdomen, Front perineal area, Right upper leg, Left upper leg   Body parts bathed by helper: Buttocks, Left arm, Right arm, Left upper leg, Left lower leg     Bathing assist Assist Level: Moderate Assistance - Patient 50 - 74%     Upper Body Dressing/Undressing Upper body dressing   What is the patient wearing?: Pull over shirt    Upper body assist Assist Level: Moderate Assistance - Patient 50 - 74%    Lower Body Dressing/Undressing Lower body dressing  What is the patient wearing?: Pants, Underwear/pull up     Lower body assist Assist for lower body dressing: Maximal Assistance - Patient 25 - 49%     Toileting Toileting    Toileting assist Assist for toileting: Maximal Assistance - Patient 25 - 49%     Transfers Chair/bed transfer  Transfers assist     Chair/bed transfer assist level: Moderate Assistance - Patient 50 - 74%     Locomotion Ambulation   Ambulation assist      Assist level: Maximal Assistance - Patient 25 - 49% Assistive device: Other (comment) (R hallway HR) Max distance: 15 ft   Walk 10 feet activity   Assist     Assist level: Maximal Assistance - Patient 25 - 49% Assistive device: Other (comment) (R  hallway HR)   Walk 50 feet activity   Assist Walk 50 feet with 2 turns activity did not occur: Safety/medical concerns         Walk 150 feet activity   Assist Walk 150 feet activity did not occur: Safety/medical concerns         Walk 10 feet on uneven surface  activity   Assist Walk 10 feet on uneven surfaces activity did not occur: Safety/medical concerns         Wheelchair     Assist Is the patient using a wheelchair?: Yes Type of Wheelchair: Manual    Wheelchair assist level: Dependent - Patient 0% Max wheelchair distance: 250 ft    Wheelchair 50 feet with 2 turns activity    Assist        Assist Level: Dependent - Patient 0%   Wheelchair 150 feet activity     Assist      Assist Level: Dependent - Patient 0%   Blood pressure (!) 147/84, pulse 88, temperature 98.2 F (36.8 C), temperature source Oral, resp. rate 19, height 5' 11 (1.803 m), weight 86.6 kg, SpO2 100%.  Medical Problem List and Plan: 1. Functional deficits secondary to debility following PEA arrest             -patient may  shower             -ELOS/Goals: 18-21d MinA  -Continue CIR  - Expected discharge 9/2  -Team conference today please see physician documentation under team conference tab, met with team  to discuss problems,progress, and goals. Formulized individual treatment plan based on medical history, underlying problem and comorbidities.   2.  Antithrombotics: -DVT/anticoagulation:  Pharmaceutical: Lovenox              -antiplatelet therapy: N/A due to IPH 3. Pain Management: Tylenol  prn Been on Lyrica  at home for diabetic neuropathic pain. 4. Mood/Behavior/Sleep: LCSW to follow for evaluation and support.              -antipsychotic agents: N/A 5. Neuropsych/cognition: This patient is not capable of making decisions on his own behalf without input from [facility].  - Patient was seen by neuropsychology, appreciate insight 6. Skin/Wound Care: Routine pressure  relief measures. Monitor incision for healing.  7. Fluids/Electrolytes/Nutrition: Strict I/O. Renal diet with CM restrictions --no fluid restriction needed 8. PEA arrest X 2/CMV viremia: on Valcyte  900 mg daily thru 10/26 with monitoring of levels every 2 weeks post completion.  Pharmacy consult  9. Heart transplant: On Gengraf  BID with prednisone  daily             --continue Coreg , lower dose Lipitor  and Zetia .  Bactrim  until 8/13 Heart Failure team will  follow -appreciate assistance -Called [place] [facility] heart transplant, cyclosporine  level added for Monday.  -[name] discussed [employer] was contacted about his pacemaker and working correctly.  - 8/18 cyclosporin level down to 274, appears cardiology planning to recheck on tomorrow, this is scheduled 10. Elevated phosphorous: Will keep on liberalized diet for food choices and to help with intake             --continue Phoslo  1334 tid ac--recheck level in am.  -8/14 Phos mildly elevated today, continue to monitor trend.  Continue PhosLo   Recheck levels  11. Hx seizures: continue Keppra  and Trileptal .   12. 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  Monitor BS ac/hs. Continue Insulin  glargine with novolog  tid ac  CBG (last 3)  Recent Labs    12/02/23 2120 12/03/23 0708 12/03/23 1202  GLUCAP 134* 141* 156*  Continue semglee  to 29U, diet changed to carb modified -8/19 last couple days CBGs a little lower in the morning and higher throughout the day, will decrease Semglee  to 26 units but increase mealtime insulin  from 5 units to 6 units. -8/20 CBGs a little more stable, continue to monitor trend  13. Anemia of chronic disease: Monitor H/H for stability.   -8/14 Hemoglobin stable at 9.6  14. R hand numbness: Possible carpal tunnel  -continue carpal tunnel wrist splint use at night  - Consider outpatient EMG  -Voltaren  gel PRN- discussed with patient  15, Diastolic hypertension: 2grams  IV mag ordered, coreg  increased to 25mg  BID -8/18 BP stable this morning Addendum BP appears elevated this evening, per chart review appears patient noted to be more lethargic and complaining of chest discomfort.  Appears PA has contacted cardiology and rapid response has been called.  - 8/19 cardiology has adjusted losartan  dose, possible increase again tomorrow.  Appreciate assistance  -8/20 cardiology increase losartan  to 50 mg, continue to monitor response    12/03/2023    5:40 AM 12/03/2023    5:00 AM 12/02/2023    8:00 PM  Vitals with BMI  Weight  190 lbs 15 oz   BMI  26.64   Systolic 147  133  Diastolic 84  89  Pulse 88  103     16.  Hypomagnesemia.  - 8/18 patient received IV mag over the weekend, started on mag oxide 400 mg daily by cardiology  - 8/19 IV magnesium  has been ordered by cardiology  -8/20 MG up to 1.6, continue supplementation  LOS: 9 days A FACE TO FACE EVALUATION WAS PERFORMED  Murray Collier 12/03/2023, 11:00 AM

## 2023-12-03 NOTE — Progress Notes (Signed)
 Speech Language Pathology Daily Session Note  Patient Details  Name: Jason Stevenson MRN: [id number] Date of Birth: 1985/06/29  Today's Date: 12/03/2023 SLP Individual Time: 9199-9154 SLP Individual Time Calculation (min): 45 min  Short Term Goals: Week 2: SLP Short Term Goal 1 (Week 2): Patient will increase vocal intensity during conversation to reach 100% intelligibility given supervision verbal A SLP Short Term Goal 2 (Week 2): Patient will recall and utilize memory strategies given supervision multimodal A SLP Short Term Goal 3 (Week 2): Patient will demonstrate sustained attention to functional tasks given supervision multimodal A SLP Short Term Goal 4 (Week 2): Patient will demonstrate mildly complex problem solving skills given supervision multimodal A  Skilled Therapeutic Interventions: Skilled therapy session focused on communication and cognitive goals. SLP facilitated session my educating patient on SLOP speech strategies, specifically increasing vocal intensity and pausing to increase breath support. SLP then prompted patient to complete x25 repetitions of expiratory muscle strength training set at 40cm H2O. Patient was 100% intelligible this date given min verbal A. SLP targeted cognitive goals through continuation of abstract word puzzle activity. Patient completed remainder of activity with minA. Patient left in bed with alarm set and call bell in reach. Continue POC  Pain None reported to SLP  Therapy/Group: Individual Therapy  Marilene Vath M.A., CCC-SLP 12/03/2023, 7:32 AM

## 2023-12-04 DIAGNOSIS — E119 Type 2 diabetes mellitus without complications: Secondary | ICD-10-CM | POA: Diagnosis not present

## 2023-12-04 DIAGNOSIS — Z941 Heart transplant status: Secondary | ICD-10-CM | POA: Diagnosis not present

## 2023-12-04 DIAGNOSIS — R5381 Other malaise: Secondary | ICD-10-CM | POA: Diagnosis not present

## 2023-12-04 DIAGNOSIS — Z794 Long term (current) use of insulin: Secondary | ICD-10-CM | POA: Diagnosis not present

## 2023-12-04 DIAGNOSIS — I5020 Unspecified systolic (congestive) heart failure: Secondary | ICD-10-CM | POA: Diagnosis not present

## 2023-12-04 DIAGNOSIS — R790 Abnormal level of blood mineral: Secondary | ICD-10-CM | POA: Diagnosis not present

## 2023-12-04 LAB — BASIC METABOLIC PANEL WITH GFR
Anion gap: 13 (ref 5–15)
BUN: 15 mg/dL (ref 6–20)
CO2: 25 mmol/L (ref 22–32)
Calcium: 9.4 mg/dL (ref 8.9–10.3)
Chloride: 106 mmol/L (ref 98–111)
Creatinine, Ser: 0.99 mg/dL (ref 0.61–1.24)
GFR, Estimated: >60 mL/min (ref >60)
Glucose, Bld: 108 mg/dL — ABNORMAL HIGH (ref 70–99)
Potassium: 3.7 mmol/L (ref 3.5–5.1)
Sodium: 144 mmol/L (ref 135–145)

## 2023-12-04 LAB — GLUCOSE, CAPILLARY
Glucose-Capillary: 111 mg/dL — ABNORMAL HIGH (ref 70–99)
Glucose-Capillary: 174 mg/dL — ABNORMAL HIGH (ref 70–99)
Glucose-Capillary: 149 mg/dL — ABNORMAL HIGH (ref 70–99)
Glucose-Capillary: 184 mg/dL — ABNORMAL HIGH (ref 70–99)

## 2023-12-04 LAB — CBC
HCT: 30.5 % — ABNORMAL LOW (ref 39.0–52.0)
Hemoglobin: 10 g/dL — ABNORMAL LOW (ref 13.0–17.0)
MCH: 28.7 pg (ref 26.0–34.0)
MCHC: 32.8 g/dL (ref 30.0–36.0)
MCV: 87.4 fL (ref 80.0–100.0)
Platelets: 282 K/uL (ref 150–400)
RBC: 3.49 MIL/uL — ABNORMAL LOW (ref 4.22–5.81)
RDW: 15 % (ref 11.5–15.5)
WBC: 6.1 K/uL (ref 4.0–10.5)
nRBC: 0 % (ref 0.0–0.2)

## 2023-12-04 LAB — MAGNESIUM: Magnesium: 1.6 mg/dL — ABNORMAL LOW (ref 1.7–2.4)

## 2023-12-04 MED ORDER — POTASSIUM CHLORIDE CRYS ER 20 MEQ PO TBCR
40.0000 meq | EXTENDED_RELEASE_TABLET | Freq: Once | ORAL | Status: AC
  Administered 2023-12-04: 40 meq via ORAL
  Filled 2023-12-04 (×2): qty 2

## 2023-12-04 MED ORDER — INSULIN ASPART 100 UNIT/ML IJ SOLN
7.0000 [IU] | Freq: Three times a day (TID) | INTRAMUSCULAR | Status: DC
  Administered 2023-12-05 – 2023-12-11 (×18): 7 [IU] via SUBCUTANEOUS

## 2023-12-04 NOTE — Progress Notes (Signed)
 Advanced Heart Failure Rounding Note  Cardiologist: Maude Emmer, MD  HF Cardiologist: Dr. Rolan Chief Complaint: OHT, Assistance with immunosuppression Subjective:    BP more stable today. 120s.    Working with therapist during visit.   Objective:    Weight Range: 87.6 kg Body mass index is 26.94 kg/m.   Vital Signs:   Temp:  [98 F (36.7 C)-98.8 F (37.1 C)] 98 F (36.7 C) (08/21 0438) Pulse Rate:  [85-103] 85 (08/21 0438) Resp:  [18-19] 18 (08/21 0438) BP: (112-132)/(76-88) 122/88 (08/21 0438) SpO2:  [97 %-100 %] 98 % (08/21 0820) Weight:  [87.6 kg] 87.6 kg (08/21 0500) Last BM Date : 12/03/23  Weight change: Filed Weights   12/02/23 0625 12/03/23 0500 12/04/23 0500  Weight: 84.5 kg 86.6 kg 87.6 kg   Intake/Output:  Intake/Output Summary (Last 24 hours) at 12/04/2023 1013 Last data filed at 12/04/2023 0000 Gross per 24 hour  Intake 598 ml  Output 200 ml  Net 398 ml    Physical Exam   General:  chronically ill appearing.  No respiratory difficulty Cor: Regular rate & rhythm. No murmurs. Extremities: no edema  Neuro: alert & oriented x 3. Affect pleasant.   Labs    CBC Recent Labs    12/04/23 0610  WBC 6.1  HGB 10.0*  HCT 30.5*  MCV 87.4  PLT 282   Basic Metabolic Panel Recent Labs    91/79/74 0535 12/04/23 0610  NA 141 144  K 3.7 3.7  CL 106 106  CO2 26 25  GLUCOSE 179* 108*  BUN 16 15  CREATININE 0.97 0.99  CALCIUM  9.1 9.4  MG 1.6* 1.6*   Medications:    Scheduled Medications:  atorvastatin   10 mg Oral Q supper   budesonide  (PULMICORT ) nebulizer solution  0.25 mg Nebulization BID   calcium  acetate  1,334 mg Oral TID WC   carvedilol   25 mg Oral BID WC   cycloSPORINE  modified  100 mg Oral BID   ezetimibe   10 mg Oral Daily   heparin  injection (subcutaneous)  5,000 Units Subcutaneous Q8H   insulin  aspart  0-5 Units Subcutaneous QHS   insulin  aspart  0-9 Units Subcutaneous TID WC   insulin  aspart  6 Units Subcutaneous TID WC    insulin  glargine  26 Units Subcutaneous QHS   levETIRAcetam   1,500 mg Oral BID   lidocaine   2 patch Transdermal Q24H   losartan   50 mg Oral Daily   magnesium  oxide  400 mg Oral BID   melatonin  3 mg Oral QHS   OXcarbazepine   300 mg Oral Daily   OXcarbazepine   600 mg Oral QHS   pantoprazole   40 mg Oral Daily   potassium chloride   40 mEq Oral Once   predniSONE   5 mg Oral Q breakfast   pregabalin   75 mg Oral QHS   Ensure Max Protein  11 oz Oral BID   protein supplement  1 Scoop Oral TID WC   traZODone   50 mg Oral QHS   valGANciclovir   900 mg Oral Daily   Infusions:  PRN Medications: acetaminophen , alum & mag hydroxide-simeth, bisacodyl , diclofenac  Sodium, diphenhydrAMINE , guaiFENesin -dextromethorphan, ipratropium-albuterol , prochlorperazine  **OR** prochlorperazine  **OR** prochlorperazine , senna-docusate, sodium phosphate  Patient Profile   38 y.o. 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   Transferred to CIR 11/24/23 after prolonged hospitalization at  [facility] w/ CMV viremia c/b PEA arrest, subsequent [facility] ECMO cannulation >>VV ECMO and eventual trach.  Assessment/Plan   HFrEF d/t NICM s/p OHT 8/24 - s/p OHT 11/26/22. Last EMBx in 3/25 negative with normal EF.  - TTE pre-arrest with EF 40% and severely reduced RV function - s/p PEA arrest (see below) - RHC 10/15/23 with normal CI and elevated PCWP. EMBx taken 7/2 - TTE 7/29 with normal biV function.  - Completed Bactrim   11/26/23 - continue prednisone  5 mg daily - continue atorva 10 mg at bedtime + zetia  10 mg daily - continue coreg  25 mg bid - Continue losartan  50 mg daily - CSA level 346 (goal 150-200). D/w [facility] team and PharmD. On lower dose of 100 bid. Recheck trough today. Discussed with pharmacy.             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 -  induced with ganciclovir - continue valganciclovir  900 mg daily (plan for 3 months) - no change   PEA arrest on 10/01/23 - s/p VA ECLS transitioned to VV ECMO 6/21. De-cannulated 6/25. Trach placed.   - presumed respiratory etiology, possibly d/t unwitnessed seizure - ESBL Kleb HCAP s/p meropenem  - trach de-cannulated 8/4   H/o CVA - stroke with hemorrhagic conversion in 2022 and again in 4/25 - residual L sided deficits and seizures - continue keppra  and trileptal   - Appreciate CIR rehab  HTN - plan as above.   Length of Stay: 10  Beckey LITTIE Coe, NP  12/04/2023, 10:13 AM  Advanced Heart Failure Team Pager [phone] (M-F; 7a - 5p)  Please contact [facility] for night-coverage after hours (5p -7a ) and weekends on [email or web address]

## 2023-12-04 NOTE — Plan of Care (Signed)
  Problem: Consults Goal: RH GENERAL PATIENT EDUCATION Description: See Patient Education module for education specifics. Outcome: Progressing   Problem: RH BOWEL ELIMINATION Goal: RH STG MANAGE BOWEL WITH ASSISTANCE Description: STG Manage Bowel with mod I Assistance. Outcome: Progressing Goal: RH STG MANAGE BOWEL W/MEDICATION W/ASSISTANCE Description: STG Manage Bowel with Medication with mod I Assistance. Outcome: Progressing   Problem: RH BLADDER ELIMINATION Goal: RH STG MANAGE BLADDER WITH ASSISTANCE Description: STG Manage Bladder With mod I Assistance Outcome: Progressing   Problem: RH SKIN INTEGRITY Goal: RH STG SKIN FREE OF INFECTION/BREAKDOWN Description: Manage w min assist Outcome: Progressing   Problem: RH SAFETY Goal: RH STG ADHERE TO SAFETY PRECAUTIONS W/ASSISTANCE/DEVICE Description: STG Adhere to Safety Precautions With cues Assistance/Device. Outcome: Progressing   Problem: RH PAIN MANAGEMENT Goal: RH STG PAIN MANAGED AT OR BELOW PT'S PAIN GOAL Description: Pain < 4 with prns Outcome: Progressing   Problem: RH KNOWLEDGE DEFICIT GENERAL Goal: RH STG INCREASE KNOWLEDGE OF SELF CARE AFTER HOSPITALIZATION Description: Patient and wife will be able to manage care at discharge using educational resources independently Outcome: Progressing   Problem: Education: Goal: Ability to describe self-care measures that may prevent or decrease complications (Diabetes Survival Skills Education) will improve Outcome: Progressing Goal: Individualized Educational Video(s) Outcome: Progressing   Problem: Coping: Goal: Ability to adjust to condition or change in health will improve Outcome: Progressing   Problem: Fluid Volume: Goal: Ability to maintain a balanced intake and output will improve Outcome: Progressing   Problem: Health Behavior/Discharge Planning: Goal: Ability to identify and utilize available resources and services will improve Outcome:  Progressing Goal: Ability to manage health-related needs will improve Outcome: Progressing   Problem: Metabolic: Goal: Ability to maintain appropriate glucose levels will improve Outcome: Progressing   Problem: Nutritional: Goal: Maintenance of adequate nutrition will improve Outcome: Progressing Goal: Progress toward achieving an optimal weight will improve Outcome: Progressing   Problem: Skin Integrity: Goal: Risk for impaired skin integrity will decrease Outcome: Progressing   Problem: Tissue Perfusion: Goal: Adequacy of tissue perfusion will improve Outcome: Progressing

## 2023-12-04 NOTE — Progress Notes (Signed)
 Occupational Therapy Session Note  Patient Details  Name: Jason Stevenson MRN: [id number] Date of Birth: November 05, 1985  Today's Date: 12/04/2023 OT Individual Time: 9080-8984 OT Individual Time Calculation (min): 56 min    Short Term Goals: Week 2:  OT Short Term Goal 1 (Week 2): Pt will weave B LEs into pants using AE PRN with MIN A OT Short Term Goal 2 (Week 2): Pt will assisting in bringing pants to waist with MIN A provided for standing balance OT Short Term Goal 3 (Week 2): Pt will complete squat pivot transfers with MIN A OT Short Term Goal 4 (Week 2): Pt will complete UB dressing with MIN A  Skilled Therapeutic Interventions/Progress Updates:     Pt received sitting up in wc with NT present in room. Pt presenting to be upset d/t having accident d/t urinary urgency, requesting to take shower. Pt receptive to skilled OT session reporting 0/10 pain- OT offering intermittent rest breaks, repositioning, and therapeutic support to optimize participation in therapy session. Focused this session on ADL retraining with focus on hemi-techniques, trunk control, and body awareness.   Mobility: -Bed Mobility: Pt returned to bed at end of session with MIN A required to bring L LE into bed and MOD verbal cues for technique.   -STEDY transfer: Utilized STEDY for transfers WC > TTB > EOB to facilitate increased opportunities to work on sit<>stands and standing balance. Light MIN A required to stand from all surfaces this session +verbal cues for technique and L UE positioning.   ADL:  -Bathing: U/LB bathing completed in seated position on TTB for energy conservation and safety. Pt with improved sitting balance this session maintaining trunk in midline and able to demonstrate adequate dynamic sitting balance to lean towards ground when using long handled sponge to wash feet. Pt able to complete lateral leans R/L to wash buttocks with assistance required to wash L side and CGA-MIN A provided for trunk  control to increase safety.   -Dressing: U/LB dressing completed seated EOB using hemi-techniques. Able to recall hemi-technique sequence following single questioning cue donning OH shirt with MOD A and pants MAX A overall.  Pt crossed L LE into modified figure-four position to weave L LE into pants and utilized reacher to weave R LE. He then stood using bed rail with MIN A and maintained standing balance MIN with OT stabilizing L LE while bringing pants to waist- able to assist with pulling up R side of pants.   Pt with improved carryover of hemi-techniques and improved sitting/standing balance this session. Pt was left resting in bed with call bell in reach, bed alarm on, and all needs met.    Therapy Documentation Precautions:  Precautions Precautions: Fall Precaution/Restrictions Comments: L Hemiplegia Restrictions Weight Bearing Restrictions Per Provider Order: No   Therapy/Group: Individual Therapy  [name] 12/04/2023, 7:49 AM

## 2023-12-04 NOTE — Progress Notes (Signed)
 Physical Therapy Session Note  Patient Details  Name: Jason Stevenson MRN: [id number] Date of Birth: [birth year]  Today's Date: 12/04/2023 PT Individual Time: 1100-1156 + 1415-1511 PT Individual Time Calculation (min): 56 min  + 56 min  Short Term Goals: Week 1:  PT Short Term Goal 1 (Week 1): Pt willperform bed mobility with consistent ModA overall. PT Short Term Goal 2 (Week 1): Pt will perform sit<>stand transfers with no AD and modA. PT Short Term Goal 3 (Week 1): Pt will perform squat pivot transfers with MinA. PT Short Term Goal 4 (Week 1): Pt will perform stand pivot transfers with ModA +1. PT Short Term Goal 5 (Week 1): Pt will perform ambulation with MaxA +1 using RW for at least 25 ft.  Skilled Therapeutic Interventions/Progress Updates:  1st session: Pt in bed to start. Notified by primary OT that patient needs a new cushion cover due to incontinent episode. New foam cushion cover provided and donned.   Pt denies any pain, requests assist with getting socks and shoes on, donned at dependent level for time management. Supine<>sitting EOB with minA for initiation. Completes stand pivot transfer with min/modA towards his weaker L side, assist for advancing and placing LLE during transfer, demo's fair controlled lowering to sit.   Patient transported to the main gym. Focused session on initiating stair training for NMR and to help build his confidence.   Sit<>stand using HR to pull from with minA. Patient navigated up/down x4 + x4 stairs with modA using 1 hand rail on his R. Patient completed with a step-to pattern, R foot leading ascent and L leading descent. Pt requires manual assist for lifting and placing L foot for both directions, increased difficulty with descent > ascent. Processing and motor planning delayed and slow.   Pt returned to his room and left sitting up in w/c at conclusion, seat belt alarm on, phone and call bell within reach.   *of note, patient quite  distractible during session, internally and externally. Worked on focused task and his attenuation during session.   2nd session: Pt in bed on arrival - no reports of pain. Focused session on NMR gait training.   Supine<>sitting EOB with minA for initiation. Completed stand pivot transfer with minA from bed to wheelchair, towards his stronger R side. Cues for setup and positioning prior to transfer.   Transported to main gym and retrieved ace wrap to provide DF assist wrap for NMR gait training. Used EVA walker to facilitate ambulation. Patient completed sit<>stand with minA from w/c to EVA walker. Assist needed for elbow/hand placement on walker frame. + 2 assist provided for w/c follow for safety. He ambulated ~93ft + ~107ft + ~67ft with min/modA. Patient demonstrates narrow BOS, external rotation of L foot, occasional scissoring on L, and forward flexed trunk. When distracted, he can require mod/maxA for balance recovery due to inattention and forgetting to step with his L leg. Cues throughout for task attenuation.   Pt returned to his room at completion of treatment. Patient reporting need to toilet - used call bell to call for nursing assistance. Patient left with safety measures in place.    Therapy Documentation Precautions:  Precautions Precautions: Fall Precaution/Restrictions Comments: L Hemiplegia Restrictions Weight Bearing Restrictions Per Provider Order: No General:     Therapy/Group: Individual Therapy  Sherlean SHAUNNA Perks 12/04/2023, 7:44 AM

## 2023-12-04 NOTE — Progress Notes (Signed)
 Physical Therapy Session Note  Patient Details  Name: Jason Stevenson MRN: [id number] Date of Birth: February 23, 1986  Today's Date: 12/04/2023 PT Individual Time: 0835-0905 PT Individual Time Calculation (min): 30 min   Short Term Goals: Week 2:  PT Short Term Goal 1 (Week 2): Pt will perform bed mobility with consistent ModA overall. PT Short Term Goal 2 (Week 2): Pt will perform sit<>stand transfers with no AD and modA. PT Short Term Goal 3 (Week 2): Pt will perform squat pivot transfers with MinA. PT Short Term Goal 4 (Week 2): Pt will perform stand pivot transfers with ModA +1. PT Short Term Goal 5 (Week 2): Pt will perform ambulation with MaxA +1 using RW for at least 25 ft.  Skilled Therapeutic Interventions/Progress Updates: Pt presented in bed with nsg present agreeable to therapy. Pt states no pain at present but states had pain at L near man breast earlier which has subsided. Pt completed supine to sit with modA with heavy use of bed features and increased time. PTA donned shoes for time management. With increased time pt was able to perform teachback to this therapist for squat pivot transfer. Pt was able to complete lateral scoot towards w/c with CGA however required modA for squat pivot transfer to R into w/c. Pt able to reposition self in w/c with verbal cues. Pt set up with pillow supporting LUE and set up with breakfast tray. Pt left in w/c at end of session with call bell within reach and needs met.      Therapy Documentation Precautions:  Precautions Precautions: Fall Precaution/Restrictions Comments: L Hemiplegia Restrictions Weight Bearing Restrictions Per Provider Order: No General:   Vital Signs:   Pain:   Mobility:   Locomotion :    Trunk/Postural Assessment :    Balance:   Exercises:   Other Treatments:      Therapy/Group: Individual Therapy  Florena Kozma 12/04/2023, 12:40 PM

## 2023-12-04 NOTE — Progress Notes (Signed)
 PROGRESS NOTE   Subjective/Complaints: Patient had some pain last night under his left arm.  He reports he was sore in this area but pain has improved and not bothering her very much at the moment.  No additional concerns.   Review of systems: denies fever, chills, headache, shortness of breath, abdominal pain, nausea, vomiting, diarrhea, constipation, new motor or sensory changes  + R hand tingling/numbness-continued  Objective:   No results found.  Recent Labs    12/04/23 0610  WBC 6.1  HGB 10.0*  HCT 30.5*  PLT 282    Recent Labs    12/03/23 0535 12/04/23 0610  NA 141 144  K 3.7 3.7  CL 106 106  CO2 26 25  GLUCOSE 179* 108*  BUN 16 15  CREATININE 0.97 0.99  CALCIUM  9.1 9.4    Intake/Output Summary (Last 24 hours) at 12/04/2023 9167 Last data filed at 12/04/2023 0000 Gross per 24 hour  Intake 716 ml  Output 500 ml  Net 216 ml        Physical Exam: Vital Signs Blood pressure 122/88, pulse 85, temperature 98 F (36.7 C), resp. rate 18, height 5' 11 (1.803 m), weight 87.6 kg, SpO2 98%.  General: No acute distress, working with therapy in his room HEENT trach site well healed  Pleasant affect Heart: Regular rate and rhythm no rubs murmurs or extra sounds Lungs: Clear auscultation bilaterally, nonlabored breathing Abdomen: Soft nontender nondistended positive bowel sounds Extremities: No edema noted Skin: No breakdown noted on visible portion  Neurologic: Awake and alert, cranial nerves II through XII grossly intact,   Sensory exam normal sensation to light touch is reduced on the left side below the knee as well as in the left upper extremity  Altered sensation plantar 1st and 2nd digit right hand Musculoskeletal: Full range of motion in all 4 extremities. No joint swelling Minimal right thumb TTP Slight tenderness along left lateral chest wall-patient reports this has improved since  yesterday Prior neuro assessment is c/w today's exam 12/04/2023.   Prior exam  motor strength is 4/5 in right deltoid, bicep, tricep, grip, hip flexor, knee extensors, ankle dorsiflexor and plantar flexor LUE  2 -/5 left deltoid, bicep, tricep, finger flexors and trace extensors. Left lower extremity 3 - hip flexor 4 - knee extensor 0 ankle dorsiflexor and plantar flexor,  Assessment/Plan: 1. Functional deficits which require 3+ hours per day of interdisciplinary therapy in a comprehensive inpatient rehab setting. Physiatrist is providing close team supervision and 24 hour management of active medical problems listed below. Physiatrist and rehab team continue to assess barriers to discharge/monitor patient progress toward functional and medical goals  Care Tool:  Bathing    Body parts bathed by patient: Left lower leg, Right arm, Left arm, Chest, Abdomen, Front perineal area, Right upper leg, Left upper leg   Body parts bathed by helper: Buttocks, Left arm, Right arm, Left upper leg, Left lower leg     Bathing assist Assist Level: Moderate Assistance - Patient 50 - 74%     Upper Body Dressing/Undressing Upper body dressing   What is the patient wearing?: Pull over shirt    Upper body assist Assist  Level: Moderate Assistance - Patient 50 - 74%    Lower Body Dressing/Undressing Lower body dressing      What is the patient wearing?: Pants, Underwear/pull up     Lower body assist Assist for lower body dressing: Maximal Assistance - Patient 25 - 49%     Toileting Toileting    Toileting assist Assist for toileting: Maximal Assistance - Patient 25 - 49%     Transfers Chair/bed transfer  Transfers assist     Chair/bed transfer assist level: Moderate Assistance - Patient 50 - 74%     Locomotion Ambulation   Ambulation assist      Assist level: Maximal Assistance - Patient 25 - 49% Assistive device: Other (comment) (R hallway HR) Max distance: 15 ft   Walk  10 feet activity   Assist     Assist level: Maximal Assistance - Patient 25 - 49% Assistive device: Other (comment) (R hallway HR)   Walk 50 feet activity   Assist Walk 50 feet with 2 turns activity did not occur: Safety/medical concerns         Walk 150 feet activity   Assist Walk 150 feet activity did not occur: Safety/medical concerns         Walk 10 feet on uneven surface  activity   Assist Walk 10 feet on uneven surfaces activity did not occur: Safety/medical concerns         Wheelchair     Assist Is the patient using a wheelchair?: Yes Type of Wheelchair: Manual    Wheelchair assist level: Dependent - Patient 0% Max wheelchair distance: 250 ft    Wheelchair 50 feet with 2 turns activity    Assist        Assist Level: Dependent - Patient 0%   Wheelchair 150 feet activity     Assist      Assist Level: Dependent - Patient 0%   Blood pressure 122/88, pulse 85, temperature 98 F (36.7 C), resp. rate 18, height 5' 11 (1.803 m), weight 87.6 kg, SpO2 98%.  Medical Problem List and Plan: 1. Functional deficits secondary to debility following PEA arrest             -patient may  shower             -ELOS/Goals: 18-21d MinA  -Continue CIR  - Expected discharge 9/2  -Team conference today please see physician documentation under team conference tab, met with team  to discuss problems,progress, and goals. Formulized individual treatment plan based on medical history, underlying problem and comorbidities.   2.  Antithrombotics: -DVT/anticoagulation:  Pharmaceutical: Lovenox              -antiplatelet therapy: N/A due to IPH 3. Pain Management: Tylenol  prn Been on Lyrica  at home for diabetic neuropathic pain. 4. Mood/Behavior/Sleep: LCSW to follow for evaluation and support.              -antipsychotic agents: N/A 5. Neuropsych/cognition: This patient is not capable of making decisions on his own behalf without input from [facility].  -  Patient was seen by neuropsychology, appreciate insight 6. Skin/Wound Care: Routine pressure relief measures. Monitor incision for healing.  7. Fluids/Electrolytes/Nutrition: Strict I/O. Renal diet with CM restrictions --no fluid restriction needed 8. PEA arrest X 2/CMV viremia: on Valcyte  900 mg daily thru 10/26 with monitoring of levels every 2 weeks post completion.  Pharmacy consult  9. Heart transplant: On Gengraf  BID with prednisone  daily             --  continue Coreg , lower dose Lipitor  and Zetia .  Bactrim  until 8/13 Heart Failure team will follow -appreciate assistance -Called [facility] [place] heart transplant, cyclosporine  level added for Monday.  -[name] discussed [employer] was contacted about his pacemaker and working correctly.  - 8/18 cyclosporin level down to 274, appears cardiology planning to recheck on tomorrow, this is scheduled  Repeat cyclosporine /CMV labs pending 10. Elevated phosphorous: Will keep on liberalized diet for food choices and to help with intake             --continue Phoslo  1334 tid ac--recheck level in am.  -8/14 Phos mildly elevated today, continue to monitor trend.  Continue PhosLo   Recheck levels  11. Hx seizures: continue Keppra  and Trileptal .   12. 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  Monitor BS ac/hs. Continue Insulin  glargine with novolog  tid ac  CBG (last 3)  Recent Labs    12/04/23 0709 12/04/23 1211 12/04/23 1654  GLUCAP 111* 174* 149*  Continue semglee  to 29U, diet changed to carb modified -8/19 last couple days CBGs a little lower in the morning and higher throughout the day, will decrease Semglee  to 26 units but increase mealtime insulin  from 5 units to 6 units. -8/21 increase aspart mealtime to 7 units 3 times daily for better control  13. Anemia of chronic disease: Monitor H/H for stability.   -8/14 Hemoglobin stable at 9.6  14. R hand numbness: Possible carpal tunnel  -continue  carpal tunnel wrist splint use at night  - Consider outpatient EMG  -Voltaren  gel PRN- discussed with patient  15, Diastolic hypertension: 2grams IV mag ordered, coreg  increased to 25mg  BID -8/18 BP stable this morning Addendum BP appears elevated this evening, per chart review appears patient noted to be more lethargic and complaining of chest discomfort.  Appears PA has contacted cardiology and rapid response has been called.  - 8/19 cardiology has adjusted losartan  dose, possible increase again tomorrow.  Appreciate assistance  -8/20 cardiology increase losartan  to 50 mg, continue to monitor response  - 8/21 BP controlled, continue to monitor    12/04/2023    1:59 PM 12/04/2023    5:00 AM 12/04/2023    4:38 AM  Vitals with BMI  Weight  193 lbs 2 oz   BMI  26.95   Systolic 125  122  Diastolic 92  88  Pulse 95  85     16.  Hypomagnesemia.  - 8/18 patient received IV mag over the weekend, started on mag oxide 400 mg daily by cardiology  - 8/19 IV magnesium  has been ordered by cardiology  -8/20 MG up to 1.6, continue supplementation  -8/20 magnesium  1.6, continue oral supplementation  LOS: 10 days A FACE TO FACE EVALUATION WAS PERFORMED  Jason Stevenson 12/04/2023, 8:32 AM

## 2023-12-04 NOTE — Plan of Care (Signed)
 Patient c/o left-side pain beneath underarm and V/S were assessed. Provider ordered k-pad to be placed to patient side. Portable equipment was unavailable to order k-pad overnight and will attempt to get one delivered in the morning. Problem: Consults Goal: RH GENERAL PATIENT EDUCATION Description: See Patient Education module for education specifics. Outcome: Progressing   Problem: RH BOWEL ELIMINATION Goal: RH STG MANAGE BOWEL WITH ASSISTANCE Description: STG Manage Bowel with mod I Assistance. Outcome: Progressing Goal: RH STG MANAGE BOWEL W/MEDICATION W/ASSISTANCE Description: STG Manage Bowel with Medication with mod I Assistance. Outcome: Progressing   Problem: RH BLADDER ELIMINATION Goal: RH STG MANAGE BLADDER WITH ASSISTANCE Description: STG Manage Bladder With mod I Assistance Outcome: Progressing   Problem: RH SKIN INTEGRITY Goal: RH STG SKIN FREE OF INFECTION/BREAKDOWN Description: Manage w min assist Outcome: Progressing   Problem: RH SAFETY Goal: RH STG ADHERE TO SAFETY PRECAUTIONS W/ASSISTANCE/DEVICE Description: STG Adhere to Safety Precautions With cues Assistance/Device. Outcome: Progressing   Problem: RH PAIN MANAGEMENT Goal: RH STG PAIN MANAGED AT OR BELOW PT'S PAIN GOAL Description: Pain < 4 with prns Outcome: Progressing   Problem: RH KNOWLEDGE DEFICIT GENERAL Goal: RH STG INCREASE KNOWLEDGE OF SELF CARE AFTER HOSPITALIZATION Description: Patient and wife will be able to manage care at discharge using educational resources independently Outcome: Progressing   Problem: Education: Goal: Ability to describe self-care measures that may prevent or decrease complications (Diabetes Survival Skills Education) will improve Outcome: Progressing Goal: Individualized Educational Video(s) Outcome: Progressing   Problem: Coping: Goal: Ability to adjust to condition or change in health will improve Outcome: Progressing   Problem: Fluid Volume: Goal: Ability to  maintain a balanced intake and output will improve Outcome: Progressing   Problem: Health Behavior/Discharge Planning: Goal: Ability to identify and utilize available resources and services will improve Outcome: Progressing Goal: Ability to manage health-related needs will improve Outcome: Progressing   Problem: Metabolic: Goal: Ability to maintain appropriate glucose levels will improve Outcome: Progressing   Problem: Nutritional: Goal: Maintenance of adequate nutrition will improve Outcome: Progressing Goal: Progress toward achieving an optimal weight will improve Outcome: Progressing   Problem: Skin Integrity: Goal: Risk for impaired skin integrity will decrease Outcome: Progressing   Problem: Tissue Perfusion: Goal: Adequacy of tissue perfusion will improve Outcome: Progressing

## 2023-12-05 ENCOUNTER — Other Ambulatory Visit (HOSPITAL_COMMUNITY): Payer: Self-pay

## 2023-12-05 DIAGNOSIS — I5020 Unspecified systolic (congestive) heart failure: Secondary | ICD-10-CM | POA: Diagnosis not present

## 2023-12-05 DIAGNOSIS — R5381 Other malaise: Secondary | ICD-10-CM | POA: Diagnosis not present

## 2023-12-05 DIAGNOSIS — Z794 Long term (current) use of insulin: Secondary | ICD-10-CM | POA: Diagnosis not present

## 2023-12-05 DIAGNOSIS — E1169 Type 2 diabetes mellitus with other specified complication: Secondary | ICD-10-CM | POA: Diagnosis not present

## 2023-12-05 DIAGNOSIS — I10 Essential (primary) hypertension: Secondary | ICD-10-CM | POA: Diagnosis not present

## 2023-12-05 DIAGNOSIS — R790 Abnormal level of blood mineral: Secondary | ICD-10-CM | POA: Diagnosis not present

## 2023-12-05 DIAGNOSIS — Z941 Heart transplant status: Secondary | ICD-10-CM | POA: Diagnosis not present

## 2023-12-05 DIAGNOSIS — E1142 Type 2 diabetes mellitus with diabetic polyneuropathy: Secondary | ICD-10-CM | POA: Diagnosis not present

## 2023-12-05 LAB — BASIC METABOLIC PANEL WITH GFR
Anion gap: 8 (ref 5–15)
BUN: 17 mg/dL (ref 6–20)
CO2: 25 mmol/L (ref 22–32)
Calcium: 9.5 mg/dL (ref 8.9–10.3)
Chloride: 109 mmol/L (ref 98–111)
Creatinine, Ser: 1.14 mg/dL (ref 0.61–1.24)
GFR, Estimated: >60 mL/min (ref >60)
Glucose, Bld: 162 mg/dL — ABNORMAL HIGH (ref 70–99)
Potassium: 4.1 mmol/L (ref 3.5–5.1)
Sodium: 142 mmol/L (ref 135–145)

## 2023-12-05 LAB — GLUCOSE, CAPILLARY
Glucose-Capillary: 162 mg/dL — ABNORMAL HIGH (ref 70–99)
Glucose-Capillary: 111 mg/dL — ABNORMAL HIGH (ref 70–99)
Glucose-Capillary: 130 mg/dL — ABNORMAL HIGH (ref 70–99)
Glucose-Capillary: 183 mg/dL — ABNORMAL HIGH (ref 70–99)

## 2023-12-05 LAB — MAGNESIUM: Magnesium: 1.6 mg/dL — ABNORMAL LOW (ref 1.7–2.4)

## 2023-12-05 MED ORDER — SACUBITRIL-VALSARTAN 24-26 MG PO TABS: 1.0000 | ORAL_TABLET | Freq: Two times a day (BID) | ORAL | Status: DC

## 2023-12-05 MED ORDER — LOSARTAN POTASSIUM 25 MG PO TABS
25.0000 mg | ORAL_TABLET | Freq: Once | ORAL | Status: AC
  Administered 2023-12-05: 25 mg via ORAL
  Filled 2023-12-05: qty 1

## 2023-12-05 MED ORDER — LOSARTAN POTASSIUM 50 MG PO TABS
75.0000 mg | ORAL_TABLET | Freq: Every day | ORAL | Status: DC
  Administered 2023-12-06 – 2023-12-08 (×3): 75 mg via ORAL
  Filled 2023-12-05 (×3): qty 1

## 2023-12-05 NOTE — Progress Notes (Signed)
 Speech Language Pathology Daily Session Note  Patient Details  Name: [name] MRN: [id number] Date of Birth: 1985-05-25  Today's Date: 12/05/2023 SLP Individual Time: 0900-0945 SLP Individual Time Calculation (min): 45 min  Short Term Goals: Week 2: SLP Short Term Goal 1 (Week 2): Patient will increase vocal intensity during conversation to reach 100% intelligibility given supervision verbal A SLP Short Term Goal 2 (Week 2): Patient will recall and utilize memory strategies given supervision multimodal A SLP Short Term Goal 3 (Week 2): Patient will demonstrate sustained attention to functional tasks given supervision multimodal A SLP Short Term Goal 4 (Week 2): Patient will demonstrate mildly complex problem solving skills given supervision multimodal A  Skilled Therapeutic Interventions: Skilled therapy session focused on cognitive and communicative goals. SLP facilitated session by prompting completion of deductive reasoning puzzle. Patient required minA to complete. SLP targeted communication goals through patients completion of EMST for 25 repetitions set at 45cm H2O. Patient 95% intelligible with supervision A this date. Patient left in bed with alarm set and call bell in reach. Continue POC  Pain None reported   Therapy/Group: Individual Therapy  Fumie Fiallo M.A., CCC-SLP 12/05/2023, 7:42 AM

## 2023-12-05 NOTE — TOC Benefit Eligibility Note (Signed)
 Pharmacy Patient Advocate Encounter  Insurance verification completed.    The patient is insured through [employer].     Ran test claim for Entresto  24-26mg  and the current 30 day co-pay is $4. (Will have to be a DAW1)   This test claim was processed through [employer]- copay amounts may vary at other pharmacies due to [employer], or as the patient moves through the different stages of their insurance plan.

## 2023-12-05 NOTE — Discharge Instructions (Addendum)
 Inpatient Rehab Discharge Instructions  Jason Stevenson Discharge date and time: No discharge date for patient encounter.   Activities/Precautions/ Functional Status: Activity: no lifting, driving, or strenuous exercise  till cleared by MD Diet: cardiac diet Wound Care: keep wound clean and dry   Functional status:  ___ No restrictions     ___ Walk up steps independently _X__ 24/7 supervision/assistance   ___ Walk up steps with assistance ___ Intermittent supervision/assistance  ___ Bathe/dress independently ___ Walk with walker     ___ Bathe/dress with assistance ___ Walk Independently    ___ Shower independently _X__ Walk with assistance    __X_ Shower with assistance ___ No alcohol     ___ Return to work/school ________  Special Instructions: Monitor blood sugars before meals and at bedtime. Follow up with Dr. Vicci for input on adjusting your insulin .  COMMUNITY REFERRALS UPON DISCHARGE:    Home Health:   PT   OT    SP    RN                Agency:[facility] Phone:[phone]    Medical Equipment/Items Ordered:[facility]  [phone]                                                  [facility]  [facility]   [phone]   My questions have been answered and I understand these instructions. I will adhere to these goals and the provided educational materials after my discharge from the hospital.  Patient/Caregiver Signature _______________________________ Date __________  Clinician Signature _______________________________________ Date __________  Please bring this form and your medication list with you to all your follow-up doctor's appointments.

## 2023-12-05 NOTE — Plan of Care (Signed)
  Problem: Consults Goal: RH GENERAL PATIENT EDUCATION Description: See Patient Education module for education specifics. 12/05/2023 0435 by Wlliam Grosso, Earnie BIRCH, RN Outcome: Progressing 12/05/2023 0434 by Jerald Hennington, Earnie BIRCH, RN Outcome: Progressing   Problem: RH BOWEL ELIMINATION Goal: RH STG MANAGE BOWEL WITH ASSISTANCE Description: STG Manage Bowel with mod I Assistance. Outcome: Progressing Goal: RH STG MANAGE BOWEL W/MEDICATION W/ASSISTANCE Description: STG Manage Bowel with Medication with mod I Assistance. Outcome: Progressing   Problem: RH BLADDER ELIMINATION Goal: RH STG MANAGE BLADDER WITH ASSISTANCE Description: STG Manage Bladder With mod I Assistance Outcome: Progressing   Problem: RH SKIN INTEGRITY Goal: RH STG SKIN FREE OF INFECTION/BREAKDOWN Description: Manage w min assist Outcome: Progressing   Problem: RH SAFETY Goal: RH STG ADHERE TO SAFETY PRECAUTIONS W/ASSISTANCE/DEVICE Description: STG Adhere to Safety Precautions With cues Assistance/Device. 12/05/2023 0435 by Genieve Ramaswamy, Earnie BIRCH, RN Outcome: Progressing 12/05/2023 0434 by Yazmin Locher, Earnie BIRCH, RN Outcome: Not Progressing   Problem: RH PAIN MANAGEMENT Goal: RH STG PAIN MANAGED AT OR BELOW PT'S PAIN GOAL Description: Pain < 4 with prns Outcome: Progressing   Problem: RH KNOWLEDGE DEFICIT GENERAL Goal: RH STG INCREASE KNOWLEDGE OF SELF CARE AFTER HOSPITALIZATION Description: Patient and wife will be able to manage care at discharge using educational resources independently Outcome: Progressing   Problem: Education: Goal: Ability to describe self-care measures that may prevent or decrease complications (Diabetes Survival Skills Education) will improve Outcome: Progressing Goal: Individualized Educational Video(s) 12/05/2023 0435 by Liberato Stansbery, Earnie BIRCH, RN Outcome: Progressing 12/05/2023 0434 by Petula Rotolo, Earnie BIRCH, RN Outcome: Progressing   Problem: Coping: Goal: Ability to adjust to condition or change in health  will improve 12/05/2023 0435 by Brit Wernette, Earnie BIRCH, RN Outcome: Progressing 12/05/2023 0434 by Lael Wetherbee, Earnie BIRCH, RN Outcome: Progressing   Problem: Fluid Volume: Goal: Ability to maintain a balanced intake and output will improve Outcome: Progressing   Problem: Health Behavior/Discharge Planning: Goal: Ability to identify and utilize available resources and services will improve Outcome: Progressing Goal: Ability to manage health-related needs will improve Outcome: Progressing   Problem: Nutritional: Goal: Maintenance of adequate nutrition will improve Outcome: Progressing Goal: Progress toward achieving an optimal weight will improve Outcome: Progressing   Problem: Skin Integrity: Goal: Risk for impaired skin integrity will decrease Outcome: Progressing   Problem: Tissue Perfusion: Goal: Adequacy of tissue perfusion will improve Outcome: Progressing

## 2023-12-05 NOTE — Progress Notes (Signed)
 Occupational Therapy Session Note  Patient Details  Name: Jason Stevenson MRN: [id number] Date of Birth: 08/21/1985  Today's Date: 12/05/2023 OT Individual Time: 8952-8795 OT Individual Time Calculation (min): 77 min    Short Term Goals: Week 2:  OT Short Term Goal 1 (Week 2): Pt will weave B LEs into pants using AE PRN with MIN A OT Short Term Goal 2 (Week 2): Pt will assisting in bringing pants to waist with MIN A provided for standing balance OT Short Term Goal 3 (Week 2): Pt will complete squat pivot transfers with MIN A OT Short Term Goal 4 (Week 2): Pt will complete UB dressing with MIN A  Skilled Therapeutic Interventions/Progress Updates:     Pt received lightly sleeping in bed, waking upon OT arrival, however requiring increased time to initiate participation in session d/t drowsiness this session. Pt presenting to be in good spirits receptive to skilled OT session reporting 0/10 pain- OT offering intermittent rest breaks, repositioning, and therapeutic support to optimize participation in therapy session. Pt requesting to take shower. Focused this session on ADL retraining and functional transfers to increase Pt's overall independence and decrease bourdon of care.   Mobility:  Bed Mobility: With HOB elevated, Pt transitioned to EOB with SUP +increased time and verbal cues for motivation and technique when lifting L LE and maneuvering hips to bring feet on floor.  -Squat Pivot: Pt completed multiple squat pivot transfers during session. EOB > WC to R light MOD A with assistance required to guide hips to wc. TTB > WC to R heavy MOD A +MAX verbal cues for technique d/t first learning experience.  -Stand pivot: WC > TTB to L using grab bars MOD A for facilitating of L LE +MAX verbal cues for technique.   ADL: -Bathing: U/LB bathing completed in seated position using hemi-techniques. Pt demo'ing sufficient dynamic sitting balance to reach towards ground using long handled sponge to  wash feet. UB bathing completed with MN A to wash R UE and under arm and LB bathing completed MIN A with assistance to wash buttocks in standing position while holding grab bar. Improved sequencing and attention to task noted this session requiring MIN-MOD verbal cues for attention.  -Dressing: U/LB dressing completed seated in wc in front of sink to utilize mirror for visual feedback of shirt positioning. Able to weave L UE with MIN A and then complete remainder of task with SUP +increased time and MOD verbal cues to problem solve shirt being stuck on L shoulder. Crossed L LE over R to weave feet into underwear/pants with CGA provided to maintain L LE in this position. He utilized Sports administrator to [employer] LE, MIN A required to hook pants over toes. Pt stood to sink with light MOD A and able to assist with pulling pants over R hip. Assistance provided with brining pants over L hip.   Pt was left resting in wc with call bell in reach, seatbelt alarm on, and all needs met.    Therapy Documentation Precautions:  Precautions Precautions: Fall Precaution/Restrictions Comments: L Hemiplegia Restrictions Weight Bearing Restrictions Per Provider Order: No   Therapy/Group: Individual Therapy  [name] 12/05/2023, 12:48 PM

## 2023-12-05 NOTE — Progress Notes (Signed)
 9344 Wounds care performed as prescribed.

## 2023-12-05 NOTE — Progress Notes (Addendum)
 Advanced Heart Failure Rounding Note  Cardiologist: Maude Emmer, MD  HF Cardiologist: Dr. Rolan Chief Complaint: OHT, Assistance with immunosuppression Subjective:    SBP 120s-140s this morning.   Sitting in bed eating breakfast. Denies CP/SOB. Feels himself getting stronger with therapies.   Objective:    Weight Range: 87.5 kg Body mass index is 26.92 kg/m.   Vital Signs:   Temp:  [98.2 F (36.8 C)-98.9 F (37.2 C)] 98.2 F (36.8 C) (08/22 0605) Pulse Rate:  [88-103] 88 (08/22 0605) Resp:  [18-19] 19 (08/22 0605) BP: (125-147)/(70-92) 147/70 (08/22 0605) SpO2:  [95 %-98 %] 98 % (08/22 0605) Weight:  [87.5 kg] 87.5 kg (08/22 0500) Last BM Date : 12/04/23  Weight change: Filed Weights   12/03/23 0500 12/04/23 0500 12/05/23 0500  Weight: 86.6 kg 87.6 kg 87.5 kg   Intake/Output:  Intake/Output Summary (Last 24 hours) at 12/05/2023 0919 Last data filed at 12/05/2023 0650 Gross per 24 hour  Intake 600 ml  Output 600 ml  Net 0 ml    Physical Exam   General:  chronically ill appearing.  No respiratory difficulty Neck: JVD ~6 cm.  Cor: Regular rate & rhythm. No murmurs. Lungs: clear Extremities: no edema  Neuro: alert & oriented x 3. Affect pleasant.   Labs    CBC Recent Labs    12/04/23 0610  WBC 6.1  HGB 10.0*  HCT 30.5*  MCV 87.4  PLT 282   Basic Metabolic Panel Recent Labs    91/78/74 0610 12/05/23 0621  NA 144 142  K 3.7 4.1  CL 106 109  CO2 25 25  GLUCOSE 108* 162*  BUN 15 17  CREATININE 0.99 1.14  CALCIUM  9.4 9.5  MG 1.6* 1.6*   Medications:    Scheduled Medications:  atorvastatin   10 mg Oral Q supper   budesonide  (PULMICORT ) nebulizer solution  0.25 mg Nebulization BID   calcium  acetate  1,334 mg Oral TID WC   carvedilol   25 mg Oral BID WC   cycloSPORINE  modified  100 mg Oral BID   ezetimibe   10 mg Oral Daily   heparin  injection (subcutaneous)  5,000 Units Subcutaneous Q8H   insulin  aspart  0-5 Units Subcutaneous QHS    insulin  aspart  0-9 Units Subcutaneous TID WC   insulin  aspart  7 Units Subcutaneous TID WC   insulin  glargine  26 Units Subcutaneous QHS   levETIRAcetam   1,500 mg Oral BID   lidocaine   2 patch Transdermal Q24H   losartan   50 mg Oral Daily   magnesium  oxide  400 mg Oral BID   melatonin  3 mg Oral QHS   OXcarbazepine   300 mg Oral Daily   OXcarbazepine   600 mg Oral QHS   pantoprazole   40 mg Oral Daily   predniSONE   5 mg Oral Q breakfast   pregabalin   75 mg Oral QHS   Ensure Max Protein  11 oz Oral BID   protein supplement  1 Scoop Oral TID WC   traZODone   50 mg Oral QHS   valGANciclovir   900 mg Oral Daily   Infusions:  PRN Medications: acetaminophen , alum & mag hydroxide-simeth, bisacodyl , diclofenac  Sodium, diphenhydrAMINE , guaiFENesin -dextromethorphan, ipratropium-albuterol , prochlorperazine  **OR** prochlorperazine  **OR** prochlorperazine , senna-docusate, sodium phosphate  Patient Profile   38 y.o. 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   Transferred to CIR 11/24/23  after prolonged hospitalization at [facility] w/ CMV viremia c/b PEA arrest, subsequent [facility] ECMO cannulation >>VV ECMO and eventual trach.  Assessment/Plan   HFrEF d/t NICM s/p OHT 8/24 - s/p OHT 11/26/22. Last EMBx in 3/25 negative with normal EF.  - TTE pre-arrest with EF 40% and severely reduced RV function - s/p PEA arrest (see below) - RHC 10/15/23 with normal CI and elevated PCWP. EMBx taken 7/2 - TTE 7/29 with normal biV function.  - Completed Bactrim   11/26/23 - continue prednisone  5 mg daily - continue atorva 10 mg at bedtime + zetia  10 mg daily - continue coreg  25 mg bid - Increase losartan  50>75 mg daily  - CSA level 346 (goal 150-200). D/w [facility] team and PharmD. On lower dose of 100 bid. Recheck trough, level pending. Discussed  with pharmacy.             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 - induced with ganciclovir - continue valganciclovir  900 mg daily (plan for 3 months) - no change   PEA arrest on 10/01/23 - s/p [facility] ECLS transitioned to VV ECMO 6/21. De-cannulated 6/25. Trach placed.   - presumed respiratory etiology, possibly d/t unwitnessed seizure - ESBL Kleb HCAP s/p meropenem  - trach de-cannulated 8/4   H/o CVA - stroke with hemorrhagic conversion in 2022 and again in 4/25 - residual L sided deficits and seizures - continue keppra  and trileptal   - Appreciate CIR rehab  HTN - plan as above.   Length of Stay: [address], [place]  12/05/2023, 9:19 AM  Advanced Heart Failure Team Pager [phone] (M-F; 7a - 5p)  Please contact [facility] for night-coverage after hours (5p -7a ) and weekends on [email or web address]

## 2023-12-05 NOTE — Progress Notes (Signed)
 PROGRESS NOTE   Subjective/Complaints: No new complaints or concerns this morning.  Pain under his left axilla has resolved.   Review of systems: denies fever, chills, headache, shortness of breath, abdominal pain, nausea, vomiting, diarrhea, constipation, new vision changes  + R hand tingling/numbness-continued  Objective:   No results found.  Recent Labs    12/04/23 0610  WBC 6.1  HGB 10.0*  HCT 30.5*  PLT 282    Recent Labs    12/04/23 0610 12/05/23 0621  NA 144 142  K 3.7 4.1  CL 106 109  CO2 25 25  GLUCOSE 108* 162*  BUN 15 17  CREATININE 0.99 1.14  CALCIUM  9.4 9.5    Intake/Output Summary (Last 24 hours) at 12/05/2023 1717 Last data filed at 12/05/2023 0650 Gross per 24 hour  Intake 360 ml  Output 600 ml  Net -240 ml        Physical Exam: Vital Signs Blood pressure (!) 147/70, pulse 88, temperature 98.2 F (36.8 C), resp. rate 19, height 5' 11 (1.803 m), weight 87.5 kg, SpO2 98%.  General: No acute distress, working with speech therapy HEENT trach site well healed  Pleasant affect Heart: Regular rate and rhythm no rubs murmurs or extra sounds Lungs: Clear auscultation bilaterally, nonlabored breathing Abdomen: Soft nontender nondistended positive bowel sounds Extremities: No edema noted Skin: No breakdown noted on visible portion  Neurologic: Awake and alert, cranial nerves II through XII grossly intact,   Sensory exam normal sensation to light touch is reduced on the left side below the knee as well as in the left upper extremity  Altered sensation palmar 1st and 2nd digit right hand Musculoskeletal: Full range of motion in all 4 extremities. No joint swelling Minimal right thumb TTP No chest wall TTP noted today Prior neuro assessment is c/w today's exam 12/05/2023.   Prior exam  motor strength is 4/5 in right deltoid, bicep, tricep, grip, hip flexor, knee extensors, ankle dorsiflexor  and plantar flexor LUE  2 -/5 left deltoid, bicep, tricep, finger flexors and trace extensors. Left lower extremity 3 - hip flexor 4 - knee extensor 0 ankle dorsiflexor and plantar flexor,  Assessment/Plan: 1. Functional deficits which require 3+ hours per day of interdisciplinary therapy in a comprehensive inpatient rehab setting. Physiatrist is providing close team supervision and 24 hour management of active medical problems listed below. Physiatrist and rehab team continue to assess barriers to discharge/monitor patient progress toward functional and medical goals  Care Tool:  Bathing    Body parts bathed by patient: Left lower leg, Left arm, Chest, Abdomen, Front perineal area, Right upper leg, Left upper leg, Face   Body parts bathed by helper: Buttocks, Left arm, Right arm     Bathing assist Assist Level: Moderate Assistance - Patient 50 - 74%     Upper Body Dressing/Undressing Upper body dressing   What is the patient wearing?: Pull over shirt    Upper body assist Assist Level: Moderate Assistance - Patient 50 - 74%    Lower Body Dressing/Undressing Lower body dressing      What is the patient wearing?: Pants, Underwear/pull up     Lower body assist Assist  for lower body dressing: Maximal Assistance - Patient 25 - 49%     Toileting Toileting    Toileting assist Assist for toileting: Maximal Assistance - Patient 25 - 49%     Transfers Chair/bed transfer  Transfers assist     Chair/bed transfer assist level: Moderate Assistance - Patient 50 - 74%     Locomotion Ambulation   Ambulation assist      Assist level: Maximal Assistance - Patient 25 - 49% Assistive device: Other (comment) (R hallway HR) Max distance: 15 ft   Walk 10 feet activity   Assist     Assist level: Maximal Assistance - Patient 25 - 49% Assistive device: Other (comment) (R hallway HR)   Walk 50 feet activity   Assist Walk 50 feet with 2 turns activity did not occur:  Safety/medical concerns         Walk 150 feet activity   Assist Walk 150 feet activity did not occur: Safety/medical concerns         Walk 10 feet on uneven surface  activity   Assist Walk 10 feet on uneven surfaces activity did not occur: Safety/medical concerns         Wheelchair     Assist Is the patient using a wheelchair?: Yes Type of Wheelchair: Manual    Wheelchair assist level: Dependent - Patient 0% Max wheelchair distance: 250 ft    Wheelchair 50 feet with 2 turns activity    Assist        Assist Level: Dependent - Patient 0%   Wheelchair 150 feet activity     Assist      Assist Level: Dependent - Patient 0%   Blood pressure (!) 147/70, pulse 88, temperature 98.2 F (36.8 C), resp. rate 19, height 5' 11 (1.803 m), weight 87.5 kg, SpO2 98%.  Medical Problem List and Plan: 1. Functional deficits secondary to debility following PEA arrest             -patient may  shower             -ELOS/Goals: 18-21d MinA  -Continue CIR  - Expected discharge 9/2  2.  Antithrombotics: -DVT/anticoagulation:  Pharmaceutical: Lovenox              -antiplatelet therapy: N/A due to IPH 3. Pain Management: Tylenol  prn Been on Lyrica  at home for diabetic neuropathic pain. 4. Mood/Behavior/Sleep: LCSW to follow for evaluation and support.              -antipsychotic agents: N/A 5. Neuropsych/cognition: This patient is not capable of making decisions on his own behalf without input from [facility].  - Patient was seen by neuropsychology, appreciate insight 6. Skin/Wound Care: Routine pressure relief measures. Monitor incision for healing.  7. Fluids/Electrolytes/Nutrition: Strict I/O. Renal diet with CM restrictions --no fluid restriction needed 8. PEA arrest X 2/CMV viremia: on Valcyte  900 mg daily thru 10/26 with monitoring of levels every 2 weeks post completion.  Pharmacy consult  9. Heart transplant: On Gengraf  BID with prednisone  daily              --continue Coreg , lower dose Lipitor  and Zetia .  Bactrim  until 8/13 Heart Failure team will follow -appreciate assistance -Called [facility] [facility], cyclosporine  level added for Monday.  -[name] discussed [employer] was contacted about his pacemaker and working correctly.  - 8/18 cyclosporin level down to 274, appears cardiology planning to recheck on tomorrow, this is scheduled  CMV levels positive but less than 200  viral load, cardiology continuing current regimen with valganciclovir   Cyclosporine  level still pending  Appreciate cardiology/heart failure team assistance 10. Elevated phosphorous: Will keep on liberalized diet for food choices and to help with intake             --continue Phoslo  1334 tid ac--recheck level in am.  -8/14 Phos mildly elevated today, continue to monitor trend.  Continue PhosLo   Recheck levels  11. Hx seizures: continue Keppra  and Trileptal .   12. 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  Monitor BS ac/hs. Continue Insulin  glargine with novolog  tid ac  CBG (last 3)  Recent Labs    12/05/23 0602 12/05/23 1216 12/05/23 1700  GLUCAP 162* 111* 130*  Continue semglee  to 29U, diet changed to carb modified -8/19 last couple days CBGs a little lower in the morning and higher throughout the day, will decrease Semglee  to 26 units but increase mealtime insulin  from 5 units to 6 units. -8/21 increase aspart mealtime to 7 units 3 times daily for better control -8/22 better control overall, continue current regimen and monitor  13. Anemia of chronic disease: Monitor H/H for stability.   -8/14 Hemoglobin stable at 9.6  14. R hand numbness: Possible carpal tunnel  -continue carpal tunnel wrist splint use at night  - Consider outpatient EMG  -Voltaren  gel PRN- discussed with patient  15, Diastolic hypertension: 2grams IV mag ordered, coreg  increased to 25mg  BID -8/18 BP stable this morning Addendum BP  appears elevated this evening, per chart review appears patient noted to be more lethargic and complaining of chest discomfort.  Appears PA has contacted cardiology and rapid response has been called.  - 8/19 cardiology has adjusted losartan  dose, possible increase again tomorrow.  Appreciate assistance  -8/20 cardiology increase losartan  to 50 mg, continue to monitor response  - 8/22, etiology has increased losartan  dose to 75 mg, appreciate assistance    12/05/2023    6:05 AM 12/05/2023    5:00 AM 12/04/2023    7:45 PM  Vitals with BMI  Weight  193 lbs   BMI  26.93   Systolic 147  128  Diastolic 70  87  Pulse 88  103     16.  Hypomagnesemia.  - 8/18 patient received IV mag over the weekend, started on mag oxide 400 mg daily by cardiology  - 8/19 IV magnesium  has been ordered by cardiology  -8/20 MG up to 1.6, continue supplementation  -8/22 magnesium  1.6, continue oral supplementation  LOS: 11 days A FACE TO FACE EVALUATION WAS PERFORMED  Murray Collier 12/05/2023, 5:17 PM

## 2023-12-06 DIAGNOSIS — R5381 Other malaise: Secondary | ICD-10-CM | POA: Diagnosis not present

## 2023-12-06 LAB — GLUCOSE, CAPILLARY
Glucose-Capillary: 68 mg/dL — ABNORMAL LOW (ref 70–99)
Glucose-Capillary: 116 mg/dL — ABNORMAL HIGH (ref 70–99)
Glucose-Capillary: 178 mg/dL — ABNORMAL HIGH (ref 70–99)
Glucose-Capillary: 160 mg/dL — ABNORMAL HIGH (ref 70–99)

## 2023-12-06 LAB — MAGNESIUM: Magnesium: 1.5 mg/dL — ABNORMAL LOW (ref 1.7–2.4)

## 2023-12-06 NOTE — Progress Notes (Addendum)
 PROGRESS NOTE   Subjective/Complaints: No events overnight.  Still having numbness and tingling in his left hand, has intermittent compliance with his brace, does not see a benefit.  States he is feeling tired today, but thinks he slept well overnight. Vitals stable     12/06/2023    8:37 AM 12/06/2023    5:00 AM 12/06/2023    4:31 AM  Vitals with BMI  Weight  193 lbs 2 oz   BMI  26.95   Systolic   138  Diastolic   98  Pulse 86  84    Recent Labs    12/05/23 1700 12/05/23 2142 12/06/23 0439  GLUCAP 130* 183* 68*     P.o. intakes appropriate  Continent of bladder  Last BM 8/22   Review of systems: denies fever, chills, headache, shortness of breath, abdominal pain, nausea, vomiting, diarrhea, constipation, new vision changes  + R hand tingling/numbness-continued  + Tired  Objective:   No results found.  Recent Labs    12/04/23 0610  WBC 6.1  HGB 10.0*  HCT 30.5*  PLT 282    Recent Labs    12/04/23 0610 12/05/23 0621  NA 144 142  K 3.7 4.1  CL 106 109  CO2 25 25  GLUCOSE 108* 162*  BUN 15 17  CREATININE 0.99 1.14  CALCIUM  9.4 9.5    Intake/Output Summary (Last 24 hours) at 12/06/2023 0945 Last data filed at 12/06/2023 0556 Gross per 24 hour  Intake 200 ml  Output 400 ml  Net -200 ml        Physical Exam: Vital Signs Blood pressure (!) 138/98, pulse 86, temperature (!) 97.4 F (36.3 C), resp. rate 16, height 5' 11 (1.803 m), weight 87.6 kg, SpO2 95%.  General: No acute distress, working with speech therapy HEENT trach site well healed  Pleasant affect Heart: Regular rate and rhythm no rubs murmurs or extra sounds Lungs: Clear auscultation bilaterally, nonlabored breathing Abdomen: Soft nontender nondistended positive bowel sounds Extremities: No edema noted Skin: No breakdown noted on visible portion  Neurologic: Awake and alert, cranial nerves II through XII grossly intact,    Sensory exam normal sensation to light touch is reduced on the left side below the knee as well as in the left upper extremity  Altered sensation palmar 1st and 2nd digit right hand--unchanged  Musculoskeletal: Full range of motion in all 4 extremities.  No joint swelling Minimal right thumb TTP  motor strength is 4/5 in right deltoid, bicep, tricep, grip, hip flexor, knee extensors, ankle dorsiflexor and plantar flexor LUE 2 -/5 left deltoid, bicep, tricep, finger flexors and trace extensors. Left lower extremity 3 - hip flexor 4 - knee extensor 0 ankle dorsiflexor and 2/5 plantar flexor,   Assessment/Plan: 1. Functional deficits which require 3+ hours per day of interdisciplinary therapy in a comprehensive inpatient rehab setting. Physiatrist is providing close team supervision and 24 hour management of active medical problems listed below. Physiatrist and rehab team continue to assess barriers to discharge/monitor patient progress toward functional and medical goals  Care Tool:  Bathing    Body parts bathed by patient: Left lower leg, Left arm, Chest, Abdomen, Front  perineal area, Right upper leg, Left upper leg, Face   Body parts bathed by helper: Buttocks, Left arm, Right arm     Bathing assist Assist Level: Moderate Assistance - Patient 50 - 74%     Upper Body Dressing/Undressing Upper body dressing   What is the patient wearing?: Pull over shirt    Upper body assist Assist Level: Moderate Assistance - Patient 50 - 74%    Lower Body Dressing/Undressing Lower body dressing      What is the patient wearing?: Pants, Underwear/pull up     Lower body assist Assist for lower body dressing: Maximal Assistance - Patient 25 - 49%     Toileting Toileting    Toileting assist Assist for toileting: Maximal Assistance - Patient 25 - 49%     Transfers Chair/bed transfer  Transfers assist     Chair/bed transfer assist level: Moderate Assistance - Patient 50 - 74%      Locomotion Ambulation   Ambulation assist      Assist level: Maximal Assistance - Patient 25 - 49% Assistive device: Other (comment) (R hallway HR) Max distance: 15 ft   Walk 10 feet activity   Assist     Assist level: Maximal Assistance - Patient 25 - 49% Assistive device: Other (comment) (R hallway HR)   Walk 50 feet activity   Assist Walk 50 feet with 2 turns activity did not occur: Safety/medical concerns         Walk 150 feet activity   Assist Walk 150 feet activity did not occur: Safety/medical concerns         Walk 10 feet on uneven surface  activity   Assist Walk 10 feet on uneven surfaces activity did not occur: Safety/medical concerns         Wheelchair     Assist Is the patient using a wheelchair?: Yes Type of Wheelchair: Manual    Wheelchair assist level: Dependent - Patient 0% Max wheelchair distance: 250 ft    Wheelchair 50 feet with 2 turns activity    Assist        Assist Level: Dependent - Patient 0%   Wheelchair 150 feet activity     Assist      Assist Level: Dependent - Patient 0%   Blood pressure (!) 138/98, pulse 86, temperature (!) 97.4 F (36.3 C), resp. rate 16, height 5' 11 (1.803 m), weight 87.6 kg, SpO2 95%.  Medical Problem List and Plan: 1. Functional deficits secondary to debility following PEA arrest             -patient may  shower             -ELOS/Goals: 18-21d MinA  -Continue CIR  - Expected discharge 9/2  2.  Antithrombotics: -DVT/anticoagulation:  Pharmaceutical: Lovenox              -antiplatelet therapy: N/A due to IPH 3. Pain Management: Tylenol  prn Been on Lyrica  at home for diabetic neuropathic pain. 4. Mood/Behavior/Sleep: LCSW to follow for evaluation and support.              -antipsychotic agents: N/A 5. Neuropsych/cognition: This patient is not capable of making decisions on his own behalf without input from [facility].  - Patient was seen by neuropsychology, appreciate  insight 6. Skin/Wound Care: Routine pressure relief measures. Monitor incision for healing.  7. Fluids/Electrolytes/Nutrition: Strict I/O. Renal diet with CM restrictions --no fluid restriction needed 8. PEA arrest X 2/CMV viremia: on Valcyte  900 mg daily thru 10/26  with monitoring of levels every 2 weeks post completion.  Pharmacy consult  9. Heart transplant: On Gengraf  BID with prednisone  daily             --continue Coreg , lower dose Lipitor  and Zetia .  Bactrim  until 8/13 Heart Failure team will follow -appreciate assistance -Called [facility] [place] [facility] heart transplant, cyclosporine  level added for Monday.  -[name] discussed [employer] was contacted about his pacemaker and working correctly.  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 Will continue current oral therapy with valganciclovir . Will make further adjustments to cyclosporine  based on his titer. Timing of administration reviewed, should be an accurate trough.     10. Elevated phosphorous: Will keep on liberalized diet for food choices and to help with intake             --continue Phoslo  1334 tid ac--recheck level in am.  -8/14 Phos mildly elevated today, continue to monitor trend.  Continue PhosLo   Recheck levels--normalized 8-16  11. Hx seizures: continue Keppra  and Trileptal .   12. 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  Monitor BS ac/hs. Continue Insulin  glargine with novolog  tid ac  CBG (last 3)  Recent Labs    12/05/23 1700 12/05/23 2142 12/06/23 0439  GLUCAP 130* 183* 68*  Continue semglee  to 29U, diet changed to carb  modified -8/19 last couple days CBGs a little lower in the morning and higher throughout the day, will decrease Semglee  to 26 units but increase mealtime insulin  from 5 units to 6 units. -8/21 increase aspart mealtime to 7 units 3 times daily for better control -8/22 better control overall, continue current regimen and monitor--controlled on current regimen  13. Anemia of chronic disease: Monitor H/H for stability.   -8/14 Hemoglobin stable at 9.6  14. R hand numbness: Possible carpal tunnel  -continue carpal tunnel wrist splint use at night  - Consider outpatient EMG  -Voltaren  gel PRN- discussed with patient  - 8-23: Encouraged compliance with nighttime brace; can take 4 to 6 weeks to see benefit  15, Diastolic hypertension: 2grams IV mag ordered, coreg  increased to 25mg  BID -8/18 BP stable this morning Addendum BP appears elevated this evening, per chart review appears patient noted to be more lethargic and complaining of chest discomfort.  Appears PA has contacted cardiology and rapid response has been called.  - 8/19 cardiology has adjusted losartan  dose, possible increase again tomorrow.  Appreciate assistance  -8/20 cardiology increase losartan  to 50 mg, continue to monitor response  - 8/22, cardiology has increased losartan  dose to 75 mg, appreciate assistance--still slightly elevated, but will give a few days for adjustment.    12/06/2023    8:37 AM 12/06/2023    5:00 AM 12/06/2023    4:31 AM  Vitals with BMI  Weight  193 lbs 2 oz   BMI  26.95   Systolic   138  Diastolic   98  Pulse 86  84     16.  Hypomagnesemia.  - 8/18 patient received IV mag over the weekend, started on mag oxide 400 mg daily by cardiology  - 8/19 IV magnesium  has been ordered by cardiology  -8/20 MG up  to 1.6, continue supplementation  -8/22 magnesium  1.6, continue oral supplementation   - 8/23: mag pending  17 .  Mild AKI.  Baseline creatinine 0.8-0.9; was 1.14 last 8-22.  Will trend in AM.   Expect some adjustment with increase of losartan .  LOS: 12 days A FACE TO FACE EVALUATION WAS PERFORMED  Jason Stevenson 12/06/2023, 9:45 AM

## 2023-12-06 NOTE — Progress Notes (Signed)
 9560 Blood sugar 68 mg/dL. Patient tolerated snack & juice well. 0553 Rechecked blood sugar =111 mg/dL at this time.

## 2023-12-06 NOTE — Progress Notes (Signed)
 Physical Therapy Session Note  Patient Details  Name: Jason Stevenson MRN: [id number] Date of Birth: 1986-01-16  Today's Date: 12/05/2023 PT Individual Time: 8494-8375 PT Individual Time Calculation (min): 79 min  Short Term Goals: Week 1:  PT Short Term Goal 1 (Week 1): Pt willperform bed mobility with consistent ModA overall. PT Short Term Goal 1 - Progress (Week 1): Met PT Short Term Goal 2 (Week 1): Pt will perform sit<>stand transfers with no AD and modA. PT Short Term Goal 2 - Progress (Week 1): Progressing toward goal PT Short Term Goal 3 (Week 1): Pt will perform squat pivot transfers with MinA. PT Short Term Goal 3 - Progress (Week 1): Progressing toward goal PT Short Term Goal 4 (Week 1): Pt will perform stand pivot transfers with ModA +1. PT Short Term Goal 4 - Progress (Week 1): Progressing toward goal PT Short Term Goal 5 (Week 1): Pt will perform ambulation with MaxA +1 using RW for at least 25 ft. PT Short Term Goal 5 - Progress (Week 1): Progressing toward goal Week 2:  PT Short Term Goal 1 (Week 2): Pt will perform bed mobility with consistent ModA overall. PT Short Term Goal 2 (Week 2): Pt will perform sit<>stand transfers with no AD and modA. PT Short Term Goal 3 (Week 2): Pt will perform squat pivot transfers with MinA. PT Short Term Goal 4 (Week 2): Pt will perform stand pivot transfers with ModA +1. PT Short Term Goal 5 (Week 2): Pt will perform ambulation with MaxA +1 using RW for at least 25 ft.  Skilled Therapeutic Interventions/Progress Updates:  Patient supine in bed on entrance to room. Patient alert and agreeable to PT session.   Patient with no pain complaint at start of session.  Therapeutic Activity: Bed Mobility: Pt assisted with supine > sit requiring significant vc for focus in bringing LLE off EOB. Is able to initiate after time with CGA but requires MinA to complete. ModA for UB to upright seated position. Once in seated position is able to  maintain with SBA.  Transfers: Pt performed squat pivot to w/c to his R side and with vc for technique and attn to task, he continues to have difficulty with initiation requiring Mod/ MaxA to initiate and then MinA to complete.   Wheelchair Mobility:  Pt propelled wheelchair 170' x2 at start and end of session. Initial distance performed with hemitechnique and requiring MinA at first with max cues initially for technique. Continues to require max cues for use of RLE to maintain straight path. Improves to supervision with max cues.   Return trip with BLE and requiring CGA/ MinA throughout with max multimodal cues to maintain focus to motor control and use of LLE.  Very slow to progress in w/c throughout session.   Neuromuscular Re-ed: NMR facilitated during session with focus on cognition/ attn, motor control, initiation. Pt guided in blocked practice of squat pivot transfers w/c<>mat table. Performed to L onto mat table and back to R into w/c. Pt allowed to become distracted to assess ability to return to focus in order to improve motor activation and control in LLE during transfer performance. He is able to improve in setup with less cueing and then is able to relate steps to setup positioning. Continues to require vc throughout for increased activation to L quad and glutes especially in order to lift from seat and then to wither push or pull with RLE in order to complete. Ultimately requires Mod/ MaxA initially and improves  to Min/ ModA. Will continue to focus on performance in order to improve LOA prior to d/c as pt is hopeful not to require physical assist.   NMR performed for improvements in motor control and coordination, balance, sequencing, judgement, and self confidence/ efficacy in performing all aspects of mobility at highest level of independence.   Patient seated upright in w/c at end of session with brakes locked, belt alarm set, and all needs within reach.   Therapy  Documentation Precautions:  Precautions Precautions: Fall Precaution/Restrictions Comments: L Hemiplegia Restrictions Weight Bearing Restrictions Per Provider Order: No  Pain: Pain Assessment Pain Scale: 0-10 Pain Score: 0-No pain related this session.    Therapy/Group: Individual Therapy  [name] PT, DPT, CSRS 12/05/2023, 12:34 PM

## 2023-12-06 NOTE — Plan of Care (Signed)
  Problem: Consults Goal: RH GENERAL PATIENT EDUCATION Description: See Patient Education module for education specifics. Outcome: Progressing   Problem: RH BOWEL ELIMINATION Goal: RH STG MANAGE BOWEL WITH ASSISTANCE Description: STG Manage Bowel with mod I Assistance. Outcome: Progressing Goal: RH STG MANAGE BOWEL W/MEDICATION W/ASSISTANCE Description: STG Manage Bowel with Medication with mod I Assistance. Outcome: Progressing   Problem: RH BLADDER ELIMINATION Goal: RH STG MANAGE BLADDER WITH ASSISTANCE Description: STG Manage Bladder With mod I Assistance Outcome: Progressing   Problem: RH SKIN INTEGRITY Goal: RH STG SKIN FREE OF INFECTION/BREAKDOWN Description: Manage w min assist Outcome: Progressing   Problem: Fluid Volume: Goal: Ability to maintain a balanced intake and output will improve Outcome: Progressing   Problem: Coping: Goal: Ability to adjust to condition or change in health will improve Outcome: Progressing

## 2023-12-06 NOTE — Plan of Care (Signed)
  Problem: Consults Goal: RH GENERAL PATIENT EDUCATION Description: See Patient Education module for education specifics. Outcome: Progressing   Problem: RH BOWEL ELIMINATION Goal: RH STG MANAGE BOWEL WITH ASSISTANCE Description: STG Manage Bowel with mod I Assistance. Outcome: Progressing Goal: RH STG MANAGE BOWEL W/MEDICATION W/ASSISTANCE Description: STG Manage Bowel with Medication with mod I Assistance. Outcome: Progressing   Problem: RH BLADDER ELIMINATION Goal: RH STG MANAGE BLADDER WITH ASSISTANCE Description: STG Manage Bladder With mod I Assistance Outcome: Progressing

## 2023-12-06 NOTE — Progress Notes (Signed)
 Prafo boot applied to LLE per MD order.    Geni Armor, LPN

## 2023-12-07 DIAGNOSIS — R5381 Other malaise: Secondary | ICD-10-CM | POA: Diagnosis not present

## 2023-12-07 LAB — BASIC METABOLIC PANEL WITH GFR
Anion gap: 6 (ref 5–15)
BUN: 14 mg/dL (ref 6–20)
CO2: 25 mmol/L (ref 22–32)
Calcium: 8.9 mg/dL (ref 8.9–10.3)
Chloride: 110 mmol/L (ref 98–111)
Creatinine, Ser: 0.96 mg/dL (ref 0.61–1.24)
GFR, Estimated: >60 mL/min (ref >60)
Glucose, Bld: 174 mg/dL — ABNORMAL HIGH (ref 70–99)
Potassium: 4 mmol/L (ref 3.5–5.1)
Sodium: 141 mmol/L (ref 135–145)

## 2023-12-07 LAB — GLUCOSE, CAPILLARY
Glucose-Capillary: 158 mg/dL — ABNORMAL HIGH (ref 70–99)
Glucose-Capillary: 222 mg/dL — ABNORMAL HIGH (ref 70–99)
Glucose-Capillary: 193 mg/dL — ABNORMAL HIGH (ref 70–99)

## 2023-12-07 LAB — MAGNESIUM: Magnesium: 1.5 mg/dL — ABNORMAL LOW (ref 1.7–2.4)

## 2023-12-07 MED ORDER — INSULIN GLARGINE 100 UNIT/ML ~~LOC~~ SOLN
20.0000 [IU] | Freq: Every day | SUBCUTANEOUS | Status: DC
  Administered 2023-12-07 – 2023-12-10 (×4): 20 [IU] via SUBCUTANEOUS
  Filled 2023-12-07 (×5): qty 0.2

## 2023-12-07 MED ORDER — MAGNESIUM OXIDE -MG SUPPLEMENT 400 (240 MG) MG PO TABS
800.0000 mg | ORAL_TABLET | Freq: Two times a day (BID) | ORAL | Status: DC
  Administered 2023-12-08 – 2023-12-16 (×17): 800 mg via ORAL
  Filled 2023-12-07 (×18): qty 2

## 2023-12-07 NOTE — Progress Notes (Signed)
 ECMO site to right groin healed ECMO site to left groin noted scabbed. Wound care orders revised per MD order.   Geni Armor, LPN

## 2023-12-07 NOTE — Progress Notes (Signed)
 PROGRESS NOTE   Subjective/Complaints: No events overnight. Blood pressure relatively well-controlled, other vitals are stable. Patient had episode of hypoglycemia today in 109s; discussed, generally lows are in the a.m. prior to breakfast.  P.o. intakes have been consistent.  Review of systems: denies fever, chills, headache, shortness of breath, abdominal pain, nausea, vomiting, diarrhea, constipation, new vision changes  + R hand tingling/numbness-continued  + Tired-improved today  Objective:   No results found.  No results for input(s): WBC, HGB, HCT, PLT in the last 72 hours.   Recent Labs    12/05/23 0621 12/07/23 0511  NA 142 141  K 4.1 4.0  CL 109 110  CO2 25 25  GLUCOSE 162* 174*  BUN 17 14  CREATININE 1.14 0.96  CALCIUM  9.5 8.9    Intake/Output Summary (Last 24 hours) at 12/07/2023 2329 Last data filed at 12/07/2023 1929 Gross per 24 hour  Intake 240 ml  Output 1000 ml  Net -760 ml        Physical Exam: Vital Signs Blood pressure (!) 145/104, pulse (!) 107, temperature 98.6 F (37 C), temperature source Oral, resp. rate 16, height 5' 11 (1.803 m), weight 88.1 kg, SpO2 100%.  General: No acute distress, sitting up in bedside chair. HEENT trach site well healed  Pleasant affect Heart: Regular rate and rhythm no rubs murmurs or extra sounds Lungs: Clear auscultation bilaterally, nonlabored breathing Abdomen: Soft nontender nondistended positive bowel sounds Extremities: No edema noted Skin: No breakdown noted on visible portion + Left groin ECMO site now scabbed over  Neurologic: Awake and alert, cranial nerves II through XII grossly intact,   Sensory exam normal sensation to light touch is reduced on the left side below the knee as well as in the left upper extremity  Altered sensation palmar 1st and 2nd digit right hand--unchanged  Musculoskeletal: Full range of motion in all 4  extremities.  No joint swelling Minimal right thumb TTP  motor strength is 4/5 in right deltoid, bicep, tricep, grip, hip flexor, knee extensors, ankle dorsiflexor and plantar flexor LUE 2 -/5 left deltoid, bicep, tricep, finger flexors and trace extensors. Left lower extremity 3 - hip flexor 4 - knee extensor 0 ankle dorsiflexor and 2/5 plantar flexor,  Physical exam unchanged from the above on reexamination 12/07/23    Assessment/Plan: 1. Functional deficits which require 3+ hours per day of interdisciplinary therapy in a comprehensive inpatient rehab setting. Physiatrist is providing close team supervision and 24 hour management of active medical problems listed below. Physiatrist and rehab team continue to assess barriers to discharge/monitor patient progress toward functional and medical goals  Care Tool:  Bathing    Body parts bathed by patient: Left lower leg, Left arm, Chest, Abdomen, Front perineal area, Right upper leg, Left upper leg, Face   Body parts bathed by helper: Buttocks, Left arm, Right arm     Bathing assist Assist Level: Moderate Assistance - Patient 50 - 74%     Upper Body Dressing/Undressing Upper body dressing   What is the patient wearing?: Pull over shirt    Upper body assist Assist Level: Moderate Assistance - Patient 50 - 74%    Lower Body  Dressing/Undressing Lower body dressing      What is the patient wearing?: Pants, Underwear/pull up     Lower body assist Assist for lower body dressing: Maximal Assistance - Patient 25 - 49%     Toileting Toileting    Toileting assist Assist for toileting: Maximal Assistance - Patient 25 - 49%     Transfers Chair/bed transfer  Transfers assist     Chair/bed transfer assist level: Moderate Assistance - Patient 50 - 74%     Locomotion Ambulation   Ambulation assist      Assist level: Maximal Assistance - Patient 25 - 49% Assistive device: Other (comment) (R hallway HR) Max distance: 15  ft   Walk 10 feet activity   Assist     Assist level: Maximal Assistance - Patient 25 - 49% Assistive device: Other (comment) (R hallway HR)   Walk 50 feet activity   Assist Walk 50 feet with 2 turns activity did not occur: Safety/medical concerns         Walk 150 feet activity   Assist Walk 150 feet activity did not occur: Safety/medical concerns         Walk 10 feet on uneven surface  activity   Assist Walk 10 feet on uneven surfaces activity did not occur: Safety/medical concerns         Wheelchair     Assist Is the patient using a wheelchair?: Yes Type of Wheelchair: Manual    Wheelchair assist level: Dependent - Patient 0% Max wheelchair distance: 250 ft    Wheelchair 50 feet with 2 turns activity    Assist        Assist Level: Dependent - Patient 0%   Wheelchair 150 feet activity     Assist      Assist Level: Dependent - Patient 0%   Blood pressure (!) 145/104, pulse (!) 107, temperature 98.6 F (37 C), temperature source Oral, resp. rate 16, height 5' 11 (1.803 m), weight 88.1 kg, SpO2 100%.  Medical Problem List and Plan: 1. Functional deficits secondary to debility following PEA arrest             -patient may  shower             -ELOS/Goals: 18-21d MinA  -Continue CIR  - Expected discharge 9/2  2.  Antithrombotics: -DVT/anticoagulation:  Pharmaceutical: Lovenox              -antiplatelet therapy: N/A due to IPH 3. Pain Management: Tylenol  prn Been on Lyrica  at home for diabetic neuropathic pain. 4. Mood/Behavior/Sleep: LCSW to follow for evaluation and support.              -antipsychotic agents: N/A 5. Neuropsych/cognition: This patient is not capable of making decisions on his own behalf without input from [facility].  - Patient was seen by neuropsychology, appreciate insight 6. Skin/Wound Care: Routine pressure relief measures. Monitor incision for healing.    - 8/24: ECMO site scabbed over; stop aquacel. Informed  nursing to keep dry given location  7. Fluids/Electrolytes/Nutrition: Strict I/O. Renal diet with CM restrictions --no fluid restriction needed 8. PEA arrest X 2/CMV viremia: on Valcyte  900 mg daily thru 10/26 with monitoring of levels every 2 weeks post completion.  Pharmacy consult  9. Heart transplant: On Gengraf  BID with prednisone  daily             --continue Coreg , lower dose Lipitor  and Zetia .  Bactrim  until 8/13 Heart Failure team will follow -appreciate assistance -Called [place]  [facility] transplant, cyclosporine  level added for Monday.  -[name] discussed [employer] was contacted about his pacemaker and working correctly.  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   - 8/22: Per cardiology note: CMV titer returned with positive, <200 viral load. Will continue current oral therapy with valganciclovir . Will make further adjustments to cyclosporine  based on his titer. Timing of administration reviewed, should be an accurate trough.   -Patient remained hemodynamically stable over the weekend, cardiology last saw 8-22.      10. Elevated phosphorous: Will keep on liberalized diet for food choices and to help with intake             --continue Phoslo  1334 tid ac--recheck level in am.  -8/14 Phos mildly elevated today, continue to monitor trend.  Continue PhosLo   Recheck levels--normalized 8-16  11. Hx seizures: continue Keppra  and Trileptal .   12. 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  Monitor BS ac/hs. Continue Insulin  glargine with novolog  tid ac  CBG (last 3)  Recent Labs    12/07/23 0533 12/07/23 1234 12/07/23 2029  GLUCAP 158* 222* 193*  Continue semglee  to 29U,  diet changed to carb modified -8/19 last couple days CBGs a little lower in the morning and higher throughout the day, will decrease Semglee  to 26 units but increase mealtime insulin  from 5 units to 6 units. -8/21 increase aspart mealtime to 7 units 3 times daily for better control -8/22 better control overall, continue current regimen and monitor--controlled on current regimen 8-24: Low this a.m., reduce Semglee  from 26 to 20 units.  Meal coverage appears appropriate.  13. Anemia of chronic disease: Monitor H/H for stability.   -8/14 Hemoglobin stable at 9.6  14. R hand numbness: Possible carpal tunnel  -continue carpal tunnel wrist splint use at night  - Consider outpatient EMG  -Voltaren  gel PRN- discussed with patient  - 8-23: Encouraged compliance with nighttime brace; can take 4 to 6 weeks to see benefit  15, Diastolic hypertension: 2grams IV mag ordered, coreg  increased to 25mg  BID -8/18 BP stable this morning Addendum BP appears elevated this evening, per chart review appears patient noted to be more lethargic and complaining of chest discomfort.  Appears PA has contacted cardiology and rapid response has been called.  - 8/19 cardiology has adjusted losartan  dose, possible increase again tomorrow.  Appreciate assistance  -8/20 cardiology increase losartan  to 50 mg, continue to monitor response  - 8/22, cardiology has increased losartan  dose to 75 mg, appreciate assistance--still slightly elevated, but will give a few days for adjustment.  8-24: Diastolic uptrending, otherwise stable.  Defer adjustment to cardiology in AM.    12/07/2023    7:29 PM 12/07/2023    3:56 PM 12/07/2023    8:26 AM  Vitals with BMI  Systolic 145 142   Diastolic 104 100   Pulse 107 100 95     16.  Hypomagnesemia.  - 8/18 patient received IV mag over the weekend, started on mag oxide 400 mg daily by cardiology  - 8/19 IV magnesium  has been ordered by cardiology  -8/20 MG up to 1.6, continue  supplementation  -8/22 magnesium  1.6, continue oral supplementation  8-24: Mag 1.5 last 2 days, but has stayed stable; increase supplement to  800 mg twice daily.  17 .  Mild AKI.  Baseline creatinine 0.8-0.9; was 1.14 last 8-22.  Will trend in AM.  Expect some adjustment with increase of losartan .  8-24: Creatinine back to baseline.   LOS: 13 days A FACE TO FACE EVALUATION WAS PERFORMED  Joesph JAYSON Likes 12/07/2023, 11:29 PM

## 2023-12-07 NOTE — Progress Notes (Signed)
 Physical Therapy Session Note  Patient Details  Name: Jason Stevenson MRN: [id number] Date of Birth: [birth year]  Today's Date: 12/07/2023 PT Individual Time: 1005-1100 PT Individual Time Calculation (min): 55 min   Short Term Goals: Week 2:  PT Short Term Goal 1 (Week 2): Pt will perform bed mobility with consistent ModA overall. PT Short Term Goal 2 (Week 2): Pt will perform sit<>stand transfers with no AD and modA. PT Short Term Goal 3 (Week 2): Pt will perform squat pivot transfers with MinA. PT Short Term Goal 4 (Week 2): Pt will perform stand pivot transfers with ModA +1. PT Short Term Goal 5 (Week 2): Pt will perform ambulation with MaxA +1 using RW for at least 25 ft.  Skilled Therapeutic Interventions/Progress Updates:     Pt received seated at EO B and agrees to therapy. No complaint of pain. Pt performs sit to stand with modA and stand step transfer to [facility] with modA/maxA and cues for posture, sequencing, and positioning. WC transport to gym. Pt performs squat pivot to the Rt with modA/maxA and cues for anterior weight shifting, body mechanics, and initiation. PT provides education on imporance of weight bearing through Lt hemibody to re-establish communication between brain and muscles. Pt performs standing activity with mirror for visual feedback. Pt stands with hemiwalker and modA and PT blocking Lt knee. Pt tasked with removing squigz from mirror with Rt hand, then rotating body to the Lt to place squigz in bucket, intended to promote Lt lateral weight shifting. Pt completes activity with modA and facilitation of rotation and weight shifting, as well as upright posture. Following rest break, pt stands again with modA and tasked with performing foot taps on 4 step with RLE, promoting Lt lateral weight shifting and balance challenge. Pt completes x2 with RUE support on hemiwalker. Pt then removes RUE support and PT cues to attempt step, but pt unable to clear Rt foot from floor. Pt takes  seated rest break. Pt performs additional bout of standing with hemiwalker and attempting to shift weight Lt while deceasing reliance on RUE support. PT provides modA to maxA for stability due to tendency for anterior trunk lean. WC transport back to room. Left seated with all needs within reach.   Therapy Documentation Precautions:  Precautions Precautions: Fall Precaution/Restrictions Comments: L Hemiplegia Restrictions Weight Bearing Restrictions Per Provider Order: No  Therapy/Group: Individual Therapy  [name], PT, DPT 12/07/2023, 3:46 PM

## 2023-12-07 NOTE — Plan of Care (Signed)
  Problem: Consults Goal: RH GENERAL PATIENT EDUCATION Description: See Patient Education module for education specifics. Outcome: Progressing   Problem: RH BOWEL ELIMINATION Goal: RH STG MANAGE BOWEL WITH ASSISTANCE Description: STG Manage Bowel with mod I Assistance. Outcome: Progressing Goal: RH STG MANAGE BOWEL W/MEDICATION W/ASSISTANCE Description: STG Manage Bowel with Medication with mod I Assistance. Outcome: Progressing   Problem: RH BLADDER ELIMINATION Goal: RH STG MANAGE BLADDER WITH ASSISTANCE Description: STG Manage Bladder With mod I Assistance Outcome: Progressing   Problem: RH SKIN INTEGRITY Goal: RH STG SKIN FREE OF INFECTION/BREAKDOWN Description: Manage w min assist Outcome: Progressing   Problem: RH SAFETY Goal: RH STG ADHERE TO SAFETY PRECAUTIONS W/ASSISTANCE/DEVICE Description: STG Adhere to Safety Precautions With cues Assistance/Device. Outcome: Progressing   Problem: RH PAIN MANAGEMENT Goal: RH STG PAIN MANAGED AT OR BELOW PT'S PAIN GOAL Description: Pain < 4 with prns Outcome: Progressing   Problem: Education: Goal: Ability to describe self-care measures that may prevent or decrease complications (Diabetes Survival Skills Education) will improve Outcome: Progressing Goal: Individualized Educational Video(s) Outcome: Progressing   Problem: Tissue Perfusion: Goal: Adequacy of tissue perfusion will improve Outcome: Progressing

## 2023-12-07 NOTE — Progress Notes (Signed)
 Hypoglycemic Event  CBG: 63  Treatment: 4 oz juice/soda  Symptoms: None  Follow-up CBG: Time: 80 CBG Result:11:30am, 11:45am  Possible Reasons for Event: Unknown  Comments/MD notified:Dr. Emeline Geni DELENA Kit

## 2023-12-08 DIAGNOSIS — Z941 Heart transplant status: Secondary | ICD-10-CM | POA: Diagnosis not present

## 2023-12-08 DIAGNOSIS — E1142 Type 2 diabetes mellitus with diabetic polyneuropathy: Secondary | ICD-10-CM | POA: Diagnosis not present

## 2023-12-08 DIAGNOSIS — I10 Essential (primary) hypertension: Secondary | ICD-10-CM | POA: Diagnosis not present

## 2023-12-08 DIAGNOSIS — R5381 Other malaise: Secondary | ICD-10-CM | POA: Diagnosis not present

## 2023-12-08 LAB — GLUCOSE, CAPILLARY
Glucose-Capillary: 192 mg/dL — ABNORMAL HIGH (ref 70–99)
Glucose-Capillary: 233 mg/dL — ABNORMAL HIGH (ref 70–99)
Glucose-Capillary: 106 mg/dL — ABNORMAL HIGH (ref 70–99)
Glucose-Capillary: 150 mg/dL — ABNORMAL HIGH (ref 70–99)

## 2023-12-08 LAB — BASIC METABOLIC PANEL WITH GFR
Anion gap: 6 (ref 5–15)
BUN: 16 mg/dL (ref 6–20)
CO2: 25 mmol/L (ref 22–32)
Calcium: 9.1 mg/dL (ref 8.9–10.3)
Chloride: 107 mmol/L (ref 98–111)
Creatinine, Ser: 0.95 mg/dL (ref 0.61–1.24)
GFR, Estimated: >60 mL/min (ref >60)
Glucose, Bld: 173 mg/dL — ABNORMAL HIGH (ref 70–99)
Potassium: 3.8 mmol/L (ref 3.5–5.1)
Sodium: 138 mmol/L (ref 135–145)

## 2023-12-08 LAB — CBC
HCT: 29.8 % — ABNORMAL LOW (ref 39.0–52.0)
Hemoglobin: 9.7 g/dL — ABNORMAL LOW (ref 13.0–17.0)
MCH: 28.4 pg (ref 26.0–34.0)
MCHC: 32.6 g/dL (ref 30.0–36.0)
MCV: 87.4 fL (ref 80.0–100.0)
Platelets: 272 K/uL (ref 150–400)
RBC: 3.41 MIL/uL — ABNORMAL LOW (ref 4.22–5.81)
RDW: 14.9 % (ref 11.5–15.5)
WBC: 6.1 K/uL (ref 4.0–10.5)
nRBC: 0 % (ref 0.0–0.2)

## 2023-12-08 LAB — MAGNESIUM: Magnesium: 1.5 mg/dL — ABNORMAL LOW (ref 1.7–2.4)

## 2023-12-08 LAB — CYTOMEGALOVIRUS DNA, QUANTITATIVE REAL-TIME PCR, PLASMA
CMV DNA Quant: POSITIVE [IU]/mL
Log10 CMV Qn DNA Pl: UNDETERMINED {Log_IU}/mL

## 2023-12-08 LAB — CYCLOSPORINE: Cyclosporine, LabCorp: 107 ng/mL (ref 100–400)

## 2023-12-08 MED ORDER — LOSARTAN POTASSIUM 50 MG PO TABS
100.0000 mg | ORAL_TABLET | Freq: Every day | ORAL | Status: DC
  Administered 2023-12-09 – 2023-12-16 (×8): 100 mg via ORAL
  Filled 2023-12-08 (×8): qty 2

## 2023-12-08 NOTE — Plan of Care (Signed)
  Problem: Consults Goal: RH GENERAL PATIENT EDUCATION Description: See Patient Education module for education specifics. Outcome: Progressing   Problem: RH BOWEL ELIMINATION Goal: RH STG MANAGE BOWEL WITH ASSISTANCE Description: STG Manage Bowel with mod I Assistance. Outcome: Progressing Goal: RH STG MANAGE BOWEL W/MEDICATION W/ASSISTANCE Description: STG Manage Bowel with Medication with mod I Assistance. Outcome: Progressing   Problem: RH BLADDER ELIMINATION Goal: RH STG MANAGE BLADDER WITH ASSISTANCE Description: STG Manage Bladder With mod I Assistance Outcome: Progressing   Problem: RH SKIN INTEGRITY Goal: RH STG SKIN FREE OF INFECTION/BREAKDOWN Description: Manage w min assist Outcome: Progressing   Problem: RH SAFETY Goal: RH STG ADHERE TO SAFETY PRECAUTIONS W/ASSISTANCE/DEVICE Description: STG Adhere to Safety Precautions With cues Assistance/Device. Outcome: Progressing   Problem: RH PAIN MANAGEMENT Goal: RH STG PAIN MANAGED AT OR BELOW PT'S PAIN GOAL Description: Pain < 4 with prns Outcome: Progressing   Problem: RH KNOWLEDGE DEFICIT GENERAL Goal: RH STG INCREASE KNOWLEDGE OF SELF CARE AFTER HOSPITALIZATION Description: Patient and wife will be able to manage care at discharge using educational resources independently Outcome: Progressing   Problem: Education: Goal: Ability to describe self-care measures that may prevent or decrease complications (Diabetes Survival Skills Education) will improve Outcome: Progressing Goal: Individualized Educational Video(s) Outcome: Progressing   Problem: Coping: Goal: Ability to adjust to condition or change in health will improve Outcome: Progressing   Problem: Fluid Volume: Goal: Ability to maintain a balanced intake and output will improve Outcome: Progressing   Problem: Health Behavior/Discharge Planning: Goal: Ability to identify and utilize available resources and services will improve Outcome:  Progressing Goal: Ability to manage health-related needs will improve Outcome: Progressing   Problem: Metabolic: Goal: Ability to maintain appropriate glucose levels will improve Outcome: Progressing   Problem: Nutritional: Goal: Maintenance of adequate nutrition will improve Outcome: Progressing Goal: Progress toward achieving an optimal weight will improve Outcome: Progressing   Problem: Skin Integrity: Goal: Risk for impaired skin integrity will decrease Outcome: Progressing   Problem: Tissue Perfusion: Goal: Adequacy of tissue perfusion will improve Outcome: Progressing

## 2023-12-08 NOTE — Progress Notes (Signed)
 Patient ID: Jason Stevenson, male   DOB: 1986/01/01, 38 y.o.   MRN: [id number]  Spoke with wife who reports other family members want to come in and participate in family ed. Also scheduled for Friday from 1:00-4:00 and will leave Sat in case still needed. Will let team know of plan

## 2023-12-08 NOTE — Progress Notes (Signed)
 PROGRESS NOTE   Subjective/Complaints: No new complaints or concerns this morning.   Review of systems: denies new rash, headache, shortness of breath, abdominal pain, nausea, vomiting, diarrhea, constipation, new motor or sensory changes  + R hand tingling/numbness-continued  Objective:   No results found.  Recent Labs    12/08/23 0501  WBC 6.1  HGB 9.7*  HCT 29.8*  PLT 272     Recent Labs    12/07/23 0511 12/08/23 0501  NA 141 138  K 4.0 3.8  CL 110 107  CO2 25 25  GLUCOSE 174* 173*  BUN 14 16  CREATININE 0.96 0.95  CALCIUM  8.9 9.1    Intake/Output Summary (Last 24 hours) at 12/08/2023 1316 Last data filed at 12/08/2023 1257 Gross per 24 hour  Intake 480 ml  Output 700 ml  Net -220 ml        Physical Exam: Vital Signs Blood pressure (!) 159/112, pulse 93, temperature 98.1 F (36.7 C), temperature source Oral, resp. rate 16, height 5' 11 (1.803 m), weight 85.2 kg, SpO2 96%.  General: No acute distress, sitting in bed HEENT trach site well healed  Pleasant affect Heart: RRR Lungs: Clear auscultation bilaterally, no increased  WOB Abdomen: Soft nontender nondistended positive bowel sounds Extremities: No edema noted Skin: No breakdown noted on visible portion + Left groin ECMO site now scabbed over  Neurologic: Awake and alert, cranial nerves II through XII grossly intact,   Sensory exam normal sensation to light touch is reduced on the left side below the knee as well as in the left upper extremity  Altered sensation palmar 1st and 2nd digit right hand--unchanged  Musculoskeletal: Full range of motion in all 4 extremities.  No joint swelling , Minimal right thumb TTP  motor strength is 4/5 in right deltoid, bicep, tricep, grip, hip flexor, knee extensors, ankle dorsiflexor and plantar flexor LUE 2 -/5 left deltoid, bicep, tricep, finger flexors and trace extensors. Left lower extremity 3 -  hip flexor 4 - knee extensor 0 ankle dorsiflexor and 2/5 plantar flexor,  Physical exam unchanged from the above on reexamination 12/08/23    Assessment/Plan: 1. Functional deficits which require 3+ hours per day of interdisciplinary therapy in a comprehensive inpatient rehab setting. Physiatrist is providing close team supervision and 24 hour management of active medical problems listed below. Physiatrist and rehab team continue to assess barriers to discharge/monitor patient progress toward functional and medical goals  Care Tool:  Bathing    Body parts bathed by patient: Left lower leg, Left arm, Chest, Abdomen, Front perineal area, Right upper leg, Left upper leg, Face   Body parts bathed by helper: Buttocks, Left arm, Right arm     Bathing assist Assist Level: Moderate Assistance - Patient 50 - 74%     Upper Body Dressing/Undressing Upper body dressing   What is the patient wearing?: Pull over shirt    Upper body assist Assist Level: Moderate Assistance - Patient 50 - 74%    Lower Body Dressing/Undressing Lower body dressing      What is the patient wearing?: Pants, Underwear/pull up     Lower body assist Assist for lower body dressing: Maximal  Assistance - Patient 25 - 49%     Toileting Toileting    Toileting assist Assist for toileting: Maximal Assistance - Patient 25 - 49%     Transfers Chair/bed transfer  Transfers assist     Chair/bed transfer assist level: Moderate Assistance - Patient 50 - 74%     Locomotion Ambulation   Ambulation assist      Assist level: Maximal Assistance - Patient 25 - 49% Assistive device: Other (comment) (R hallway HR) Max distance: 15 ft   Walk 10 feet activity   Assist     Assist level: Maximal Assistance - Patient 25 - 49% Assistive device: Other (comment) (R hallway HR)   Walk 50 feet activity   Assist Walk 50 feet with 2 turns activity did not occur: Safety/medical concerns         Walk 150 feet  activity   Assist Walk 150 feet activity did not occur: Safety/medical concerns         Walk 10 feet on uneven surface  activity   Assist Walk 10 feet on uneven surfaces activity did not occur: Safety/medical concerns         Wheelchair     Assist Is the patient using a wheelchair?: Yes Type of Wheelchair: Manual    Wheelchair assist level: Dependent - Patient 0% Max wheelchair distance: 250 ft    Wheelchair 50 feet with 2 turns activity    Assist        Assist Level: Dependent - Patient 0%   Wheelchair 150 feet activity     Assist      Assist Level: Dependent - Patient 0%   Blood pressure (!) 159/112, pulse 93, temperature 98.1 F (36.7 C), temperature source Oral, resp. rate 16, height 5' 11 (1.803 m), weight 85.2 kg, SpO2 96%.  Medical Problem List and Plan: 1. Functional deficits secondary to debility following PEA arrest             -patient may  shower             -ELOS/Goals: 18-21d MinA  -Continue CIR  - Expected discharge 9/2  2.  Antithrombotics: -DVT/anticoagulation:  Pharmaceutical: Lovenox              -antiplatelet therapy: N/A due to IPH 3. Pain Management: Tylenol  prn Been on Lyrica  at home for diabetic neuropathic pain. 4. Mood/Behavior/Sleep: LCSW to follow for evaluation and support.              -antipsychotic agents: N/A 5. Neuropsych/cognition: This patient is not capable of making decisions on his own behalf without input from [facility].  - Patient was seen by neuropsychology, appreciate insight 6. Skin/Wound Care: Routine pressure relief measures. Monitor incision for healing.    - 8/24: ECMO site scabbed over; stop aquacel. Informed nursing to keep dry given location  7. Fluids/Electrolytes/Nutrition: Strict I/O. Renal diet with CM restrictions --no fluid restriction needed 8. PEA arrest X 2/CMV viremia: on Valcyte  900 mg daily thru 10/26 with monitoring of levels every 2 weeks post completion.  Pharmacy consult  9.  Heart transplant: On Gengraf  BID with prednisone  daily             --continue Coreg , lower dose Lipitor  and Zetia .  Bactrim  until 8/13 Heart Failure team will follow -appreciate assistance -Called [facility] [place] [facility] heart transplant, cyclosporine  level added for Monday.  -[name] discussed [employer] was contacted about his pacemaker and working correctly.  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   - 8/22: Per cardiology note: CMV titer returned with positive, <200 viral load. Will continue current oral therapy with valganciclovir . Will make further adjustments to cyclosporine  based on his titer. Timing of administration reviewed, should be an accurate trough.   -Patient remained hemodynamically stable over the weekend, cardiology last saw 8-22.     -8/25 Increase losartan  to 100mg , will notify cardiology, cardiology last saw 8/22, cyclosporine  level was 107 on 8/21      12/08/2023    4:51 AM 12/08/2023    3:31 AM 12/07/2023    7:29 PM  Vitals with BMI  Weight 187 lbs 13 oz    BMI 26.21    Systolic  159 145  Diastolic  887 104  Pulse  93 107     10. Elevated phosphorous: Will keep on liberalized diet for food choices and to help with intake             --continue Phoslo  1334 tid ac--recheck level in am.  -8/14 Phos mildly elevated today, continue to monitor trend.  Continue PhosLo   Recheck levels--normalized 8-16  11. Hx seizures: continue Keppra  and Trileptal .   12. 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  Monitor BS ac/hs. Continue Insulin  glargine with novolog  tid ac  CBG (last 3)  Recent Labs    12/07/23 2029 12/08/23 0533 12/08/23 1217  GLUCAP 193* 192* 233*  Continue  semglee  to 29U, diet changed to carb modified -8/19 last couple days CBGs a little lower in the morning and higher throughout the day, will decrease Semglee  to 26 units but increase mealtime insulin  from 5 units to 6 units. -8/21 increase aspart mealtime to 7 units 3 times daily for better control -8/22 better control overall, continue current regimen and monitor--controlled on current regimen 8-24: Low this a.m., reduce Semglee  from 26 to 20 units.  Meal coverage appears appropriate. 8/25 monitor response to medication change  13. Anemia of chronic disease: Monitor H/H for stability.   -8/14 Hemoglobin stable at 9.6  14. R hand numbness: Possible carpal tunnel  -continue carpal tunnel wrist splint use at night  - Consider outpatient EMG  -Voltaren  gel PRN- discussed with patient  - 8-23: Encouraged compliance with nighttime brace; can take 4 to 6 weeks to see benefit  15, Diastolic hypertension: 2grams IV mag ordered, coreg  increased to 25mg  BID -8/18 BP stable this morning Addendum BP appears elevated this evening, per chart review appears patient noted to be more lethargic and complaining of chest discomfort.  Appears PA has contacted cardiology and rapid response has been called.  - 8/19 cardiology has adjusted losartan  dose, possible increase again tomorrow.  Appreciate assistance  -8/20 cardiology increase losartan  to 50 mg, continue to monitor response  - 8/22, cardiology has increased losartan  dose to 75 mg, appreciate assistance--still slightly elevated, but will give a few days for adjustment.  8-24: Diastolic uptrending, otherwise stable.  Defer adjustment to cardiology in AM.  8/25 losartan  increased as above    12/08/2023    4:51 AM 12/08/2023    3:31 AM 12/07/2023    7:29 PM  Vitals with BMI  Weight 187 lbs 13 oz    BMI 26.21    Systolic  159 145  Diastolic  887 104  Pulse  93 107     16.  Hypomagnesemia.  - 8/18 patient received IV mag over the weekend, started on  mag oxide 400 mg daily by cardiology  - 8/19 IV magnesium  has been ordered by cardiology  -8/20 MG up to 1.6, continue supplementation  -8/22 magnesium  1.6, continue oral supplementation  8-24: Mag 1.5 last 2 days, but has stayed stable; increase supplement to 800 mg twice daily.  8/25 continue current regimen and monitor, Mg 1.5 today  17 .  Mild AKI.  Baseline creatinine 0.8-0.9; was 1.14 last 8-22.  Will trend in AM.  Expect some adjustment with increase of losartan .  8-24: Creatinine back to baseline.   8/25 stable at creatinine 0.95 today    LOS: 14 days A FACE TO FACE EVALUATION WAS PERFORMED  Murray Collier 12/08/2023, 1:16 PM

## 2023-12-08 NOTE — Plan of Care (Signed)
  Problem: Consults Goal: RH GENERAL PATIENT EDUCATION Description: See Patient Education module for education specifics. Outcome: Progressing   Problem: RH BOWEL ELIMINATION Goal: RH STG MANAGE BOWEL WITH ASSISTANCE Description: STG Manage Bowel with mod I Assistance. Outcome: Progressing Goal: RH STG MANAGE BOWEL W/MEDICATION W/ASSISTANCE Description: STG Manage Bowel with Medication with mod I Assistance. Outcome: Progressing   Problem: RH BLADDER ELIMINATION Goal: RH STG MANAGE BLADDER WITH ASSISTANCE Description: STG Manage Bladder With mod I Assistance Outcome: Progressing   Problem: RH SKIN INTEGRITY Goal: RH STG SKIN FREE OF INFECTION/BREAKDOWN Description: Manage w min assist Outcome: Progressing   Problem: RH PAIN MANAGEMENT Goal: RH STG PAIN MANAGED AT OR BELOW PT'S PAIN GOAL Description: Pain < 4 with prns Outcome: Progressing

## 2023-12-08 NOTE — Progress Notes (Signed)
 Physical Therapy Session Note  Patient Details  Name: Jason Stevenson MRN: [id number] Date of Birth: [birth year]  Today's Date: 12/08/2023 PT Individual Time: 8641-8484 PT Individual Time Calculation (min): 77 min   Short Term Goals: Week 1:  PT Short Term Goal 1 (Week 1): Pt willperform bed mobility with consistent ModA overall. PT Short Term Goal 1 - Progress (Week 1): Met PT Short Term Goal 2 (Week 1): Pt will perform sit<>stand transfers with no AD and modA. PT Short Term Goal 2 - Progress (Week 1): Progressing toward goal PT Short Term Goal 3 (Week 1): Pt will perform squat pivot transfers with MinA. PT Short Term Goal 3 - Progress (Week 1): Progressing toward goal PT Short Term Goal 4 (Week 1): Pt will perform stand pivot transfers with ModA +1. PT Short Term Goal 4 - Progress (Week 1): Progressing toward goal PT Short Term Goal 5 (Week 1): Pt will perform ambulation with MaxA +1 using RW for at least 25 ft. PT Short Term Goal 5 - Progress (Week 1): Progressing toward goal Week 2:  PT Short Term Goal 1 (Week 2): Pt will perform bed mobility with consistent ModA overall. PT Short Term Goal 2 (Week 2): Pt will perform sit<>stand transfers with no AD and modA. PT Short Term Goal 3 (Week 2): Pt will perform squat pivot transfers with MinA. PT Short Term Goal 4 (Week 2): Pt will perform stand pivot transfers with ModA +1. PT Short Term Goal 5 (Week 2): Pt will perform ambulation with MaxA +1 using RW for at least 25 ft.  Skilled Therapeutic Interventions/Progress Updates:  Patient supine in bed on entrance to room. Patient alert and agreeable to PT session.   Patient with no pain complaint at start of session.  Therapeutic Activity: Bed Mobility: Pt requires time to focus on rising to seated position. Is finally able to initiate bringing RLE then requiring focus and vc for LLE off EOB. Rises up to seated position by pulling on therapist's arm to reach seated position. Minor LOB to R  but then uses R arm to brace and push up to seated position on EOB.  Transfers: Pt performed squat pivot transfer to w/c placed to L side and requires ModA for focus through cues and then hand placement of R hand close to body. MOdA to complete rise over feet and then swivel of hips to w/c seat facilitate by therapist. Required multimodal cues throughout for focus and understanding. Outside, pt performs sit<>stand to handrail with focus on pushing from w/c armrest and requires ModA for power up and then can complete with CGA/ MinA.   Neuromuscular Re-ed: NMR facilitated during session with focus on standing balance, motor control, proprioception. Pt guided in standing balance at handrail outside and focus through vc/tc to need for maintaining LE extension. Guided pt in minisquats and shift/ pivot of hips laterally in order to improve proprioception and joint approximation.   Pt also guided in kick of ball using LLE while seated in w/c. Pt takes time to motor plan and then initiate. With vc is able to lift knee and extend lower leg to produce light kick of ball. Progressed to providing vc for timing of kick of LLE with rehab tech rolling ball to foot. Timing remains poor but does imrpove throughout attempts.   NMR performed for improvements in motor control and coordination, balance, sequencing, judgement, and self confidence/ efficacy in performing all aspects of mobility at highest level of independence.   Patient seated upright  in w/c at end of session with brakes locked, belt alarm set, and all needs within reach.   Therapy Documentation Precautions:  Precautions Precautions: Fall Precaution/Restrictions Comments: L Hemiplegia Restrictions Weight Bearing Restrictions Per Provider Order: No  Pain: No pain related this session.    Therapy/Group: Individual Therapy   [name] PT, DPT, CSRS 12/08/2023, 6:12 PM

## 2023-12-08 NOTE — Progress Notes (Signed)
 Speech Language Pathology Daily Session Note  Patient Details  Name: Nghia Mcentee MRN: [id number] Date of Birth: 1986/02/02  Today's Date: 12/08/2023 SLP Individual Time: 0800-0859 SLP Individual Time Calculation (min): 59 min  Short Term Goals: Week 2: SLP Short Term Goal 1 (Week 2): Patient will increase vocal intensity during conversation to reach 100% intelligibility given supervision verbal A SLP Short Term Goal 2 (Week 2): Patient will recall and utilize memory strategies given supervision multimodal A SLP Short Term Goal 3 (Week 2): Patient will demonstrate sustained attention to functional tasks given supervision multimodal A SLP Short Term Goal 4 (Week 2): Patient will demonstrate mildly complex problem solving skills given supervision multimodal A  Skilled Therapeutic Interventions: Skilled therapy session focused on cognitive goals. SLP facilitated session by prompting completion of vacation budgeting task. Patient requested budgeting for upcoming planned vacation in [place] . Patient recalled necessary items to budget for given supervision A (flight, car, hotel, etc) and completed simple addition given supervisionA. Patient then requesting to use urinal and change pants. SLP assisted as able. Patient consumed portion of breakfast at the end of the session with set up A. Patient left in bed with alarm set and call bell in reach. Continue POC.    Pain None reported   Therapy/Group: Individual Therapy  Dora Clauss M.A., CCC-SLP 12/08/2023, 7:43 AM

## 2023-12-08 NOTE — Progress Notes (Signed)
 Occupational Therapy Session Note  Patient Details  Name: Jason Stevenson MRN: [id number] Date of Birth: 1985-10-12  Today's Date: 12/08/2023 OT Individual Time: 9082-8984 OT Individual Time Calculation (min): 58 min    Short Term Goals: Week 2:  OT Short Term Goal 1 (Week 2): Pt will weave B LEs into pants using AE PRN with MIN A OT Short Term Goal 2 (Week 2): Pt will assisting in bringing pants to waist with MIN A provided for standing balance OT Short Term Goal 3 (Week 2): Pt will complete squat pivot transfers with MIN A OT Short Term Goal 4 (Week 2): Pt will complete UB dressing with MIN A  Skilled Therapeutic Interventions/Progress Updates:     Pt received sitting EOB finishing breakfast. Pt presenting to be in good spirits receptive to skilled OT session reporting 0/10 pain- OT offering intermittent rest breaks, repositioning, and therapeutic support to optimize participation in therapy session. Pt requesting to take shower this AM. Focused this session on ADL retraining and functional transfer training with emphasis on body awareness and safety awareness.   Mobility:  -Squat Pivot Transfers: Pt completed squat pivot EOB > WC to R with light MOD A required for balance and to guide hips to wc. Improved carryover of head/hip relationship.  -Stand Pivot: Pt completed stand pivot transfers wc <> TTB using bed rail with MOD A required for facilitating L LE during transfer and verbal cues for technique.   ADL:  -Bathing: U/LB bathing completed in seated position on TTB for energy conservation and safety. Pt with sufficient dynamic sitting balance this session to maintain sitting balance with SUP. Utilized L UE as a stabilizer during bathing tasks and completed self-HOH A to incorporate L UE into bathing for increased functional use during ADLs. He stood using grab bars x1 trial with heavy MOD A required to power up to standing position. Maintained standing balance with MIN A while OT  assisted with washing buttocks.  -Dressing: U/LB dressing completed seated in wc. Pt able to donn pants using hemi-techniques with MOD A to weave weave L LE and to bring pants to waist. Pt utilizes reacher to weave feet, however he demonstrates increased challenges problem solving technique to utilize requiring MOD verbal cues. He was able to maintain standing balance at sink with MIN A while brining pants to waist using R UE- assistance required to bring pants over L hip. Donned OH shirt with MOD A to fully weave L UE and bring shirt over his head d/t large amount of hair.   Pt was left resting in wc with call bell in reach, seatbelt alarm on, and all needs met.    Therapy Documentation Precautions:  Precautions Precautions: Fall Precaution/Restrictions Comments: L Hemiplegia Restrictions Weight Bearing Restrictions Per Provider Order: No   Therapy/Group: Individual Therapy  [name] 12/08/2023, 8:02 AM

## 2023-12-09 DIAGNOSIS — I10 Essential (primary) hypertension: Secondary | ICD-10-CM | POA: Diagnosis not present

## 2023-12-09 DIAGNOSIS — Z941 Heart transplant status: Secondary | ICD-10-CM | POA: Diagnosis not present

## 2023-12-09 DIAGNOSIS — R5381 Other malaise: Secondary | ICD-10-CM | POA: Diagnosis not present

## 2023-12-09 DIAGNOSIS — Z794 Long term (current) use of insulin: Secondary | ICD-10-CM | POA: Diagnosis not present

## 2023-12-09 DIAGNOSIS — I5020 Unspecified systolic (congestive) heart failure: Secondary | ICD-10-CM | POA: Diagnosis not present

## 2023-12-09 DIAGNOSIS — E1169 Type 2 diabetes mellitus with other specified complication: Secondary | ICD-10-CM | POA: Diagnosis not present

## 2023-12-09 LAB — MAGNESIUM: Magnesium: 1.6 mg/dL — ABNORMAL LOW (ref 1.7–2.4)

## 2023-12-09 LAB — GLUCOSE, CAPILLARY: Glucose-Capillary: 140 mg/dL — ABNORMAL HIGH (ref 70–99)

## 2023-12-09 MED ORDER — CYCLOSPORINE 25 MG PO CAPS
25.0000 mg | ORAL_CAPSULE | Freq: Once | ORAL | Status: AC
  Administered 2023-12-09: 25 mg via ORAL
  Filled 2023-12-09: qty 1

## 2023-12-09 MED ORDER — CYCLOSPORINE 25 MG PO CAPS
125.0000 mg | ORAL_CAPSULE | Freq: Every day | ORAL | Status: DC
  Administered 2023-12-10: 125 mg via ORAL
  Filled 2023-12-09 (×2): qty 1

## 2023-12-09 MED ORDER — CYCLOSPORINE MODIFIED (GENGRAF) 25 MG PO CAPS
100.0000 mg | ORAL_CAPSULE | Freq: Every evening | ORAL | Status: DC
  Administered 2023-12-09 – 2023-12-13 (×5): 100 mg via ORAL
  Filled 2023-12-09 (×5): qty 4

## 2023-12-09 NOTE — Progress Notes (Signed)
 Occupational Therapy Session Note  Patient Details  Name: Jason Stevenson MRN: [id number] Date of Birth: July 30, 1985  Today's Date: 12/09/2023 OT Individual Time: 8661-8564 OT Individual Time Calculation (min): 57 min    Short Term Goals: Week 2:  OT Short Term Goal 1 (Week 2): Pt will weave B LEs into pants using AE PRN with MIN A OT Short Term Goal 2 (Week 2): Pt will assisting in bringing pants to waist with MIN A provided for standing balance OT Short Term Goal 3 (Week 2): Pt will complete squat pivot transfers with MIN A OT Short Term Goal 4 (Week 2): Pt will complete UB dressing with MIN A  Skilled Therapeutic Interventions/Progress Updates:     Pt received sitting up in wc, dressed and ready for the day with all ADL needs met. Pt presenting to be in good spirits receptive to skilled OT session reporting 0/10 pain- OT offering intermittent rest breaks, repositioning, and therapeutic support to optimize participation in therapy session. Focused this session on L UE ROM, NMR, and body alignment to support improved muscle engagement and body awareness for ADLs. Transported Pt to/from therapy gym this session in wc for time management and energy conservation. Began session by engaging Pt in gentle PROM stretches to L UE to increase ROM and decrease tone in preparation for functional tasks. Increased tone noted in L hand, however decreased with slow prolonged stretch. Facilitated NMR with L UE positioned on towel at table top, Pt completed table glides moving through scapular protraction/retraction in combination with elbow flexion/extension with verbal and tactile cues required for muscle activation, to incorporate deep breathing into movement, postural alignment, and to decrease compensatory movements. Donned e-stim onto Pt's L triceps and wrist extension to facilitate wrist extension in combination with elbow extension to simulate functional reaching patterns. During on phase Pt completed  targeted reach table glides and during off phase Pt completed scapular retraction/elbow flexion to bring hand to chest. Improved elbow and tricep extension noted this session. Pt able to tolerate e-stim for 15 minutes with no pain reported and skin was intact. Finished session with Pt desired activity of Plinko- activity completed in standing position with L UE supported on table top with OT facilitating WB'ing and weight shifting into L hemibody for increased proprioceptive feedback. Tolerated standing ~8 minutes with CGA-MIN A. Pt was left resting in wc with call bell in reach, seatbelt alarm on, and all needs met.    Therapy Documentation Precautions:  Precautions Precautions: Fall Precaution/Restrictions Comments: L Hemiplegia Restrictions Weight Bearing Restrictions Per Provider Order: No   Therapy/Group: Individual Therapy  [name] 12/09/2023, 7:17 AM

## 2023-12-09 NOTE — Plan of Care (Signed)
  Problem: Consults Goal: RH GENERAL PATIENT EDUCATION Description: See Patient Education module for education specifics. Outcome: Progressing   Problem: RH BOWEL ELIMINATION Goal: RH STG MANAGE BOWEL WITH ASSISTANCE Description: STG Manage Bowel with mod I Assistance. Outcome: Progressing Goal: RH STG MANAGE BOWEL W/MEDICATION W/ASSISTANCE Description: STG Manage Bowel with Medication with mod I Assistance. Outcome: Progressing   Problem: RH BLADDER ELIMINATION Goal: RH STG MANAGE BLADDER WITH ASSISTANCE Description: STG Manage Bladder With mod I Assistance Outcome: Progressing   Problem: RH SKIN INTEGRITY Goal: RH STG SKIN FREE OF INFECTION/BREAKDOWN Description: Manage w min assist Outcome: Progressing   Problem: RH SAFETY Goal: RH STG ADHERE TO SAFETY PRECAUTIONS W/ASSISTANCE/DEVICE Description: STG Adhere to Safety Precautions With cues Assistance/Device. Outcome: Progressing

## 2023-12-09 NOTE — Progress Notes (Signed)
 Advanced Heart Failure Rounding Note  Cardiologist: Maude Emmer, MD  HF Cardiologist: Dr. Rolan Chief Complaint: OHT, Assistance with immunosuppression Subjective:    No complaints; slowly progressing with PT. No fevers/chills/N/V/D.   Objective:    Weight Range: 86.7 kg Body mass index is 26.66 kg/m.   Vital Signs:   Temp:  [97.4 F (36.3 C)-98 F (36.7 C)] 98 F (36.7 C) (08/26 0526) Pulse Rate:  [86-99] 86 (08/26 0526) Resp:  [12-19] 19 (08/26 0526) BP: (123-148)/(84-99) 146/99 (08/26 0526) SpO2:  [99 %-100 %] 100 % (08/26 0526) Weight:  [86.7 kg] 86.7 kg (08/26 0527) Last BM Date : 12/06/23  Weight change: Filed Weights   12/07/23 0500 12/08/23 0451 12/09/23 0527  Weight: 88.1 kg 85.2 kg 86.7 kg   Intake/Output:  Intake/Output Summary (Last 24 hours) at 12/09/2023 1047 Last data filed at 12/08/2023 1749 Gross per 24 hour  Intake 480 ml  Output --  Net 480 ml    Physical Exam   General:  NAD; chronically ill appearing Neck: JVD flat Cor: RRR Lungs: CTA Extremities: no edema  Neuro: alert & oriented x 3. Affect pleasant.   Labs    CBC Recent Labs    12/08/23 0501  WBC 6.1  HGB 9.7*  HCT 29.8*  MCV 87.4  PLT 272   Basic Metabolic Panel Recent Labs    91/75/74 0511 12/08/23 0501 12/09/23 0448  NA 141 138  --   K 4.0 3.8  --   CL 110 107  --   CO2 25 25  --   GLUCOSE 174* 173*  --   BUN 14 16  --   CREATININE 0.96 0.95  --   CALCIUM  8.9 9.1  --   MG 1.5* 1.5* 1.6*   Medications:    Scheduled Medications:  atorvastatin   10 mg Oral Q supper   budesonide  (PULMICORT ) nebulizer solution  0.25 mg Nebulization BID   calcium  acetate  1,334 mg Oral TID WC   carvedilol   25 mg Oral BID WC   [START ON 12/10/2023] cycloSPORINE   125 mg Oral Daily   cycloSPORINE  modified  100 mg Oral QPM   ezetimibe   10 mg Oral Daily   heparin  injection (subcutaneous)  5,000 Units Subcutaneous Q8H   insulin  aspart  0-5 Units Subcutaneous QHS   insulin   aspart  0-9 Units Subcutaneous TID WC   insulin  aspart  7 Units Subcutaneous TID WC   insulin  glargine  20 Units Subcutaneous QHS   levETIRAcetam   1,500 mg Oral BID   lidocaine   2 patch Transdermal Q24H   losartan   100 mg Oral Daily   magnesium  oxide  800 mg Oral BID   melatonin  3 mg Oral QHS   OXcarbazepine   300 mg Oral Daily   OXcarbazepine   600 mg Oral QHS   pantoprazole   40 mg Oral Daily   predniSONE   5 mg Oral Q breakfast   pregabalin   75 mg Oral QHS   Ensure Max Protein  11 oz Oral BID   protein supplement  1 Scoop Oral TID WC   traZODone   50 mg Oral QHS   valGANciclovir   900 mg Oral Daily   Infusions:  PRN Medications: acetaminophen , alum & mag hydroxide-simeth, bisacodyl , diclofenac  Sodium, diphenhydrAMINE , guaiFENesin -dextromethorphan, ipratropium-albuterol , prochlorperazine  **OR** prochlorperazine  **OR** prochlorperazine , senna-docusate, sodium phosphate  Patient Profile   38 y.o. 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   Transferred to CIR 11/24/23 after prolonged hospitalization at [facility] w/ CMV viremia c/b PEA arrest, subsequent VA ECMO cannulation >>VV ECMO and eventual trach.  Assessment/Plan   HFrEF d/t NICM s/p OHT 8/24 - s/p OHT 11/26/22. Last EMBx in 3/25 negative with normal EF.  - TTE pre-arrest with EF 40% and severely reduced RV function - s/p PEA arrest (see below) - RHC 10/15/23 with normal CI and elevated PCWP. EMBx taken 7/2 - TTE 7/29 with normal biV function.  - Completed Bactrim   11/26/23 - continue prednisone  5 mg daily - continue atorva 10 mg at bedtime + zetia  10 mg daily - continue coreg  25 mg bid - continue losartan  100mg  daily; hypertensive today. Will monitor.  - CSA level subtherapeutic; increase to 125mg  qAM and 100mg  at bedtime. Discussed with [facility] & pharmD.  No clinical signs of infection at this time.   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    PEA arrest on 10/01/23 - s/p VA ECLS transitioned to VV ECMO 6/21. De-cannulated 6/25. Trach placed.   - presumed respiratory etiology, possibly d/t unwitnessed seizure - ESBL Kleb HCAP s/p meropenem  - trach de-cannulated 8/4   H/o CVA - stroke with hemorrhagic conversion in 2022 and again in 4/25 - residual L sided deficits and seizures - continue keppra  and trileptal   - Appreciate CIR rehab  HTN - plan as above.   Length of Stay: [address], DO  12/09/2023, 10:47 AM  Advanced Heart Failure Team Pager [phone] (M-F; 7a - 5p)  Please contact [facility] for night-coverage after hours (5p -7a ) and weekends on [email or web address]

## 2023-12-09 NOTE — Progress Notes (Signed)
 PROGRESS NOTE   Subjective/Complaints: No new complaints this AM. Denies Pain.    Review of systems: denies chills, headache, shortness of breath, abdominal pain, nausea, vomiting, diarrhea, constipation, denies new motor or sensory changes  + R hand tingling/numbness-continued    Objective:   No results found.  Recent Labs    12/08/23 0501  WBC 6.1  HGB 9.7*  HCT 29.8*  PLT 272     Recent Labs    12/07/23 0511 12/08/23 0501  NA 141 138  K 4.0 3.8  CL 110 107  CO2 25 25  GLUCOSE 174* 173*  BUN 14 16  CREATININE 0.96 0.95  CALCIUM  8.9 9.1    Intake/Output Summary (Last 24 hours) at 12/09/2023 1450 Last data filed at 12/09/2023 1331 Gross per 24 hour  Intake 720 ml  Output --  Net 720 ml        Physical Exam: Vital Signs Blood pressure (!) 146/99, pulse 86, temperature 98 F (36.7 C), resp. rate 19, height 5' 11 (1.803 m), weight 86.7 kg, SpO2 100%.  General: No acute distress, sitting in bed HEENT trach site well healed  Pleasant affect Heart: RRR Lungs: Clear auscultation bilaterally, no increased  WOB Abdomen: Soft nontender nondistended positive bowel sounds Extremities: No edema noted Skin: No breakdown noted on visible portion + Left groin ECMO site now scabbed over  Neurologic: Awake and alert, cranial nerves II through XII grossly intact,   Sensory exam normal sensation to light touch is reduced on the left side below the knee as well as in the left upper extremity  Altered sensation palmar 1st and 2nd digit right hand--unchanged  Musculoskeletal: No joint swelling , No hand tenderness today   Physical exam unchanged from the above on reexamination 12/09/23     Prior Exam: motor strength is 4/5 in right deltoid, bicep, tricep, grip, hip flexor, knee extensors, ankle dorsiflexor and plantar flexor LUE 2 -/5 left deltoid, bicep, tricep, finger flexors and trace extensors. Left  lower extremity 3 - hip flexor 4 - knee extensor 0 ankle dorsiflexor and 2/5 plantar flexor  Assessment/Plan: 1. Functional deficits which require 3+ hours per day of interdisciplinary therapy in a comprehensive inpatient rehab setting. Physiatrist is providing close team supervision and 24 hour management of active medical problems listed below. Physiatrist and rehab team continue to assess barriers to discharge/monitor patient progress toward functional and medical goals  Care Tool:  Bathing    Body parts bathed by patient: Left lower leg, Left arm, Chest, Abdomen, Front perineal area, Right upper leg, Left upper leg, Face   Body parts bathed by helper: Buttocks, Left arm, Right arm     Bathing assist Assist Level: Moderate Assistance - Patient 50 - 74%     Upper Body Dressing/Undressing Upper body dressing   What is the patient wearing?: Pull over shirt    Upper body assist Assist Level: Moderate Assistance - Patient 50 - 74%    Lower Body Dressing/Undressing Lower body dressing      What is the patient wearing?: Pants, Underwear/pull up     Lower body assist Assist for lower body dressing: Maximal Assistance - Patient 25 - 49%  Toileting Toileting    Toileting assist Assist for toileting: Maximal Assistance - Patient 25 - 49%     Transfers Chair/bed transfer  Transfers assist     Chair/bed transfer assist level: Moderate Assistance - Patient 50 - 74%     Locomotion Ambulation   Ambulation assist      Assist level: Maximal Assistance - Patient 25 - 49% Assistive device: Other (comment) (R hallway HR) Max distance: 15 ft   Walk 10 feet activity   Assist     Assist level: Maximal Assistance - Patient 25 - 49% Assistive device: Other (comment) (R hallway HR)   Walk 50 feet activity   Assist Walk 50 feet with 2 turns activity did not occur: Safety/medical concerns         Walk 150 feet activity   Assist Walk 150 feet activity did  not occur: Safety/medical concerns         Walk 10 feet on uneven surface  activity   Assist Walk 10 feet on uneven surfaces activity did not occur: Safety/medical concerns         Wheelchair     Assist Is the patient using a wheelchair?: Yes Type of Wheelchair: Manual    Wheelchair assist level: Dependent - Patient 0% Max wheelchair distance: 250 ft    Wheelchair 50 feet with 2 turns activity    Assist        Assist Level: Dependent - Patient 0%   Wheelchair 150 feet activity     Assist      Assist Level: Dependent - Patient 0%   Blood pressure (!) 146/99, pulse 86, temperature 98 F (36.7 C), resp. rate 19, height 5' 11 (1.803 m), weight 86.7 kg, SpO2 100%.  Medical Problem List and Plan: 1. Functional deficits secondary to debility following PEA arrest             -patient may  shower             -ELOS/Goals: 18-21d MinA  -Continue CIR  - Expected discharge 9/2  -Team conference tomorrow  2.  Antithrombotics: -DVT/anticoagulation:  Pharmaceutical: Lovenox              -antiplatelet therapy: N/A due to IPH 3. Pain Management: Tylenol  prn Been on Lyrica  at home for diabetic neuropathic pain. 4. Mood/Behavior/Sleep: LCSW to follow for evaluation and support.              -antipsychotic agents: N/A 5. Neuropsych/cognition: This patient is not capable of making decisions on his own behalf without input from [facility].  - Patient was seen by neuropsychology, appreciate insight 6. Skin/Wound Care: Routine pressure relief measures. Monitor incision for healing.    - 8/24: ECMO site scabbed over; stop aquacel. Informed nursing to keep dry given location  7. Fluids/Electrolytes/Nutrition: Strict I/O. Renal diet with CM restrictions --no fluid restriction needed 8. PEA arrest X 2/CMV viremia: on Valcyte  900 mg daily thru 10/26 with monitoring of levels every 2 weeks post completion.  Pharmacy consult  9. Heart transplant: On Gengraf  BID with prednisone   daily             --continue Coreg , lower dose Lipitor  and Zetia .  Bactrim  until 8/13 Heart Failure team will follow -appreciate assistance -Called [place] [facility], cyclosporine  level added for Monday.  -[name] discussed [employer] was contacted about his pacemaker and working correctly.  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   - 8/22: Per cardiology note: CMV titer returned with positive, <200 viral load. Will continue current oral therapy with valganciclovir . Will make further adjustments to cyclosporine  based on his titer. Timing of administration reviewed, should be an accurate trough.   -Patient remained hemodynamically stable over the weekend, cardiology last saw 8-22.     -8/25 Increase losartan  to 100mg , will notify cardiology, cyclosporine  level was 107 on 8/21  -8/25 Reviewed cardiology noted, cylosporine dose has been adjusted to 125mg  Qam and 100mg  at bedtime, continue current dose losartan       12/09/2023    5:27 AM 12/09/2023    5:26 AM 12/08/2023    8:45 PM  Vitals with BMI  Weight 191 lbs 2 oz    BMI 26.67    Systolic  146   Diastolic  99   Pulse  86 98     10. Elevated phosphorous: Will keep on liberalized diet for food choices and to help with intake             --continue Phoslo  1334 tid ac--recheck level in am.  -8/14 Phos mildly elevated today, continue to monitor trend.  Continue PhosLo   Recheck levels--normalized 8-16  11. Hx seizures: continue Keppra  and Trileptal .   12. 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  Monitor BS ac/hs. Continue Insulin  glargine with novolog  tid ac  CBG (last 3)  Recent Labs    12/08/23 1217  12/08/23 1524 12/08/23 1621  GLUCAP 233* 106* 150*  Continue semglee  to 29U, diet changed to carb modified -8/19 last couple days CBGs a little lower in the morning and higher throughout the day, will decrease Semglee  to 26 units but increase mealtime insulin  from 5 units to 6 units. -8/21 increase aspart mealtime to 7 units 3 times daily for better control -8/22 better control overall, continue current regimen and monitor--controlled on current regimen 8-24: Low this a.m., reduce Semglee  from 26 to 20 units.  Meal coverage appears appropriate. 8/26  Glucose documented in flowsheet 106-246, continue to monitor   13. Anemia of chronic disease: Monitor H/H for stability.   -8/14 Hemoglobin stable at 9.6  14. R hand numbness: Possible carpal tunnel  -continue carpal tunnel wrist splint use at night  - Consider outpatient EMG  -Voltaren  gel PRN- discussed with patient  - 8-23: Encouraged compliance with nighttime brace; can take 4 to 6 weeks to see benefit  15, Diastolic hypertension: 2grams IV mag ordered, coreg  increased to 25mg  BID -8/18 BP stable this morning Addendum BP appears elevated this evening, per chart review appears patient noted to be more lethargic and complaining of chest discomfort.  Appears PA has contacted cardiology and rapid response has been called.  - 8/19 cardiology has adjusted losartan  dose, possible increase again tomorrow.  Appreciate assistance  -8/20 cardiology increase losartan  to 50 mg, continue to monitor response  - 8/22, cardiology has increased losartan  dose to 75 mg, appreciate assistance--still slightly elevated, but will give a few days for adjustment.  8-24: Diastolic uptrending, otherwise stable.  Defer adjustment to cardiology in AM.  8/25 losartan  increased as above  8/26 monitor response to medication change    12/09/2023    5:27 AM 12/09/2023    5:26 AM 12/08/2023    8:45 PM  Vitals with BMI  Weight 191 lbs  2 oz    BMI 26.67    Systolic  146    Diastolic  99   Pulse  86 98     16.  Hypomagnesemia.  - 8/18 patient received IV mag over the weekend, started on mag oxide 400 mg daily by cardiology  - 8/19 IV magnesium  has been ordered by cardiology  -8/20 MG up to 1.6, continue supplementation  -8/22 magnesium  1.6, continue oral supplementation  8-24: Mag 1.5 last 2 days, but has stayed stable; increase supplement to 800 mg twice daily.  8/25 continue current regimen and monitor, Mg 1.5 today  17 .  Mild AKI.  Baseline creatinine 0.8-0.9; was 1.14 last 8-22.  Will trend in AM.  Expect some adjustment with increase of losartan .  8-24: Creatinine back to baseline.   8/25 stable at creatinine 0.95 today    LOS: 15 days A FACE TO FACE EVALUATION WAS PERFORMED  Murray Collier 12/09/2023, 2:50 PM

## 2023-12-09 NOTE — Progress Notes (Signed)
 Occupational Therapy Session Note  Patient Details  Name: Jason Stevenson MRN: [id number] Date of Birth: 02/24/1986  Today's Date: 12/09/2023 OT Individual Time: 1102-1204 OT Individual Time Calculation (min): 62 min    Short Term Goals: Week 2:  OT Short Term Goal 1 (Week 2): Pt will weave B LEs into pants using AE PRN with MIN A OT Short Term Goal 2 (Week 2): Pt will assisting in bringing pants to waist with MIN A provided for standing balance OT Short Term Goal 3 (Week 2): Pt will complete squat pivot transfers with MIN A OT Short Term Goal 4 (Week 2): Pt will complete UB dressing with MIN A  Skilled Therapeutic Interventions/Progress Updates:   Patient agreeable to participate in OT session. Reports no pain level.   Patient participated in skilled OT session focusing on Adl completion. Patient received in wc requesting for toileting. Completed toilet transfer with max A. Patient completed toileting with mod to max A for 3/3. Patient able to void bowel. Patient did however get some urine on pants while sitting on toilet. Completed LB doffing and donning with MOD a. Utilized Sports administrator for mod A for compensatory dressing techniques. Patient completed NMR for increase in function in affected L side.  Returned to wc all needs in reach, alarm on.   Therapy Documentation Precautions:  Precautions Precautions: Fall Precaution/Restrictions Comments: L Hemiplegia Restrictions Weight Bearing Restrictions Per Provider Order: No Therapy/Group: Individual Therapy  [name] 12/09/2023, 7:28 AM

## 2023-12-10 DIAGNOSIS — Z794 Long term (current) use of insulin: Secondary | ICD-10-CM | POA: Diagnosis not present

## 2023-12-10 DIAGNOSIS — E1142 Type 2 diabetes mellitus with diabetic polyneuropathy: Secondary | ICD-10-CM | POA: Diagnosis not present

## 2023-12-10 DIAGNOSIS — D638 Anemia in other chronic diseases classified elsewhere: Secondary | ICD-10-CM | POA: Diagnosis not present

## 2023-12-10 DIAGNOSIS — R790 Abnormal level of blood mineral: Secondary | ICD-10-CM | POA: Diagnosis not present

## 2023-12-10 DIAGNOSIS — I10 Essential (primary) hypertension: Secondary | ICD-10-CM | POA: Diagnosis not present

## 2023-12-10 DIAGNOSIS — R5381 Other malaise: Secondary | ICD-10-CM | POA: Diagnosis not present

## 2023-12-10 LAB — GLUCOSE, CAPILLARY
Glucose-Capillary: 207 mg/dL — ABNORMAL HIGH (ref 70–99)
Glucose-Capillary: 176 mg/dL — ABNORMAL HIGH (ref 70–99)
Glucose-Capillary: 272 mg/dL — ABNORMAL HIGH (ref 70–99)

## 2023-12-10 LAB — MAGNESIUM: Magnesium: 1.6 mg/dL — ABNORMAL LOW (ref 1.7–2.4)

## 2023-12-10 MED ORDER — INSULIN ASPART 100 UNIT/ML IJ SOLN
0.0000 [IU] | Freq: Three times a day (TID) | INTRAMUSCULAR | Status: DC
  Administered 2023-12-10: 3 [IU] via SUBCUTANEOUS
  Administered 2023-12-11: 2 [IU] via SUBCUTANEOUS
  Administered 2023-12-11: 5 [IU] via SUBCUTANEOUS
  Administered 2023-12-12: 2 [IU] via SUBCUTANEOUS
  Administered 2023-12-12: 3 [IU] via SUBCUTANEOUS
  Administered 2023-12-12: 5 [IU] via SUBCUTANEOUS
  Administered 2023-12-13: 11 [IU] via SUBCUTANEOUS
  Administered 2023-12-14 (×2): 3 [IU] via SUBCUTANEOUS
  Administered 2023-12-14: 5 [IU] via SUBCUTANEOUS
  Administered 2023-12-15: 2 [IU] via SUBCUTANEOUS
  Administered 2023-12-15: 8 [IU] via SUBCUTANEOUS
  Administered 2023-12-16: 2 [IU] via SUBCUTANEOUS

## 2023-12-10 NOTE — Progress Notes (Signed)
 Speech Language Pathology Weekly Progress and Session Note  Patient Details  Name: Jason Stevenson MRN: [id number] Date of Birth: January 11, 1986  Beginning of progress report period: December 03, 2023 End of progress report period: December 10, 2023  Today's Date: 12/10/2023 SLP Individual Time: 1300-1400 SLP Individual Time Calculation (min): 60 min  Short Term Goals: Week 2: SLP Short Term Goal 1 (Week 2): Patient will increase vocal intensity during conversation to reach 100% intelligibility given supervision verbal A SLP Short Term Goal 1 - Progress (Week 2): Progressing toward goal SLP Short Term Goal 2 (Week 2): Patient will recall and utilize memory strategies given supervision multimodal A SLP Short Term Goal 2 - Progress (Week 2): Met SLP Short Term Goal 3 (Week 2): Patient will demonstrate sustained attention to functional tasks given supervision multimodal A SLP Short Term Goal 3 - Progress (Week 2): Met SLP Short Term Goal 4 (Week 2): Patient will demonstrate mildly complex problem solving skills given supervision multimodal A SLP Short Term Goal 4 - Progress (Week 2): Progressing toward goal    New Short Term Goals: Week 3: SLP Short Term Goal 1 (Week 3): STG = LTG due to ELOS  Weekly Progress Updates: Pt has made good gains and has met 2 of 4 STGs this reporting period due to improved sustained attention and memory. Currently, patient continues to require min A for complex problem solving and increasing speech intelligibility - specifically in social settings.  Pt/family eduction ongoing. Pt would benefit from continued ST intervention to maximize communication and cognition in order to maximize functional independence at d/c.    Intensity: Minumum of 1-2 x/day, 30 to 90 minutes Frequency: 1 to 3 out of 7 days Duration/Length of Stay: 9/2 Treatment/Interventions: Cognitive remediation/compensation;Cueing hierarchy;Functional tasks;Internal/external aids;Multimodal  communication approach;Speech/Language facilitation;Therapeutic Activities   Daily Session  Skilled Therapeutic Interventions:  Skilled therapy session focused on cognitive goals. SLP facilitated session by prompting patient to navigate to various locations around the hospital utilizing posted signs. Patient located places [employer] and gift shop) with supervision A. Patient completed simple navigation and budgeting activity in gift shop given supervision-minA. Upon return to room, patient recalled 8 specific items from gift shop. Patient continues to require minA to increase vocal intensity in social settings. Patient left in chair with alarm set and call bell in reach. Continue POC.     Pain Denies  Therapy/Group: Individual Therapy  Antwan Pandya M.A., CCC-SLP 12/10/2023, 1:57 PM

## 2023-12-10 NOTE — Progress Notes (Signed)
 Physical Therapy Session Note  Patient Details  Name: Jason Stevenson MRN: [id number] Date of Birth: 1985-12-16  Today's Date: 12/10/2023 PT Individual Time: 8954-8844  PT Individual Time Calculation (min): 70 min  Short Term Goals: Week 2:  PT Short Term Goal 1 (Week 2): Pt will perform bed mobility with consistent ModA overall. PT Short Term Goal 2 (Week 2): Pt will perform sit<>stand transfers with no AD and modA. PT Short Term Goal 3 (Week 2): Pt will perform squat pivot transfers with MinA. PT Short Term Goal 4 (Week 2): Pt will perform stand pivot transfers with ModA +1. PT Short Term Goal 5 (Week 2): Pt will perform ambulation with MaxA +1 using RW for at least 25 ft.  Skilled Therapeutic Interventions/Progress Updates:  Chart reviewed and pt agreeable to therapy. Pt received seated in WC with 0/10 c/o pain. Session focused on functional transfers, NMR, and ambulation to promote functional recovery for out of bed mobility and safe home mobility and access. Pt taken to therapy gym for time management. Pt initiated session with amb of 36ft using minA + HW. Pt noted to have slow initiation of LLE swing phase, however pt was able to progress LLE with partial hip fl, partial knee ext, and partial toe lift, displaying potential for NMR. Pt noted to have increased L knee hyper ext with fatigue. After rest, pt completed 37ft amb with minA + HW. Pt noted to have same gait presentation. Pt then taken to NuStep. On NuStep, pt completed B leg press at level 1 with emphasis on VC for LLE power and knee stability. During rests, pt completed ball squeeze and resisted hip abd with tactile assist. Pt then transferred to [facility] using modA + HW 2/2 fatigue. In room, pt practiced LLE DF with increased time to complete partial ROM. Pt tasked with continuing DF practice between therapy sessions. At end of session, pt was left seated in [facility] with alarm engaged, nurse call bell and all needs in reach.     Therapy  Documentation Precautions:  Precautions Precautions: Fall Precaution/Restrictions Comments: L Hemiplegia Restrictions Weight Bearing Restrictions Per Provider Order: No General:      Therapy/Group: Individual Therapy   Warrick KANDICE Raspberry 12/10/2023, 12:13 PM

## 2023-12-10 NOTE — Patient Care Conference (Signed)
 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 Murray Collier Social Worker Present: Rhoda Clement, LCSW Nurse Present: Barnie Ronde, RN PT Present: Recardo Milliner, PT OT Present: Katheryn Mines, OT SLP Present: Recardo Mole, SLP     Current Status/Progress Goal Weekly Team Focus  Bowel/Bladder   Pt continent of b/b   Remain continent   Assist with toileting needs    Swallow/Nutrition/ Hydration               ADL's   UB dressing MOD A, LB Dressing MOD A, Squat pivot MIN/MOD, Stand pivot MIN/MOD A, and toileting MOD A // Barriers: slowed process, slowed task inititateion, functional cognition, fatigue, hemiplegia   MIN A- will continue to assess as Pt continues to progress, hopefully can upgrade to CGA   Pt education, L NMRE, functional transfer trianing, ADL retraining    Mobility   Bed mobility = ModA, Transfers = ModA up to MinA at times for squat pivot and for sit<>stand, Ambulation = MinA for 60' using HW   overall MinA for mobility, CGA for transfers - may need to be downgraded  Barriers: multiple recent comorbidities, decreased motor planning/ control, decreased focus /// Work On: NMR L hemibody, seated/ standing balance,  awareness/ focus, gait training, motor control, strengthening, pt/ family education    Communication   95% intelligible w/ supervisionA   supervision A   EMST, generalization of speech strategies    Safety/Cognition/ Behavioral Observations  supervisionA to min for attention, processing, complex problem solving   supervisionA   cognitive retraining, pt/family education    Pain   No c/o pain   Remain pain free   Assess qshift and prn    Skin   Groin wounds, healing   Promote healing  Assess qshift and prn      Discharge Planning:  Family coming in for education n Friday from 1:00-4:00 and have another session for Sat if needed. Aware of pt's need for 24/7 care and [facility] set up via [facility]   Team Discussion: Patient admitted with debility post diabetic neuropathy, CVA, heart transplant with new CMV and remote ICH with left hemiparesis. Limited by left shoulder subluxation, pain, slow processing, poor motor planning and task initiation, poor balance, decreased attention; requiring constant cues to keep on task and perseverative behavior.   Patient on target to meet rehab goals: yes, currently patient needs mod assist for upper body care using a reacher and min assist for bathing with mod assist for toileting. Needs min assist for stand pivots using a grab bar and min assist for sit - stand. Needs mod assist for bed mobility. Able to ambulate using a hemi-walker up to 69' with min assist. Needs supervision -  min assist for structured task completion and working on complex problem solving and awareness.  Goals for discharge set for min assist overall.  *See Care Plan and progress notes for long and short-term goals.   Revisions to Treatment Plan:  Cardiology following OP nerve conduction study referral   Teaching Needs: Safety, medications, splints/bracing, transfers, toileting, etc   Current Barriers to Discharge: Decreased caregiver support and Home enviroment  access/layout  Possible Resolutions to Barriers: Family education 12/12/23 OP follow up services DME: hemiwalker     Medical Summary Current Status: Debily, Heart transplant, T2DM, R hand numbness, Diastolic HTN, Hypomagnesemia, mild AKI  Barriers to Discharge: Cardiac Complications;Self-care education;Medical stability;Uncontrolled Hypertension  Barriers to Discharge Comments: Debily, Heart transplant, T2DM, R hand numbness, Diastolic HTN, Hypomagnesemia, mild AKI Possible Resolutions to [employer] Focus: Adjust BP medications, adjust inslin, wrist brace, monitor bmp/cbc/mg   Continued Need for Acute Rehabilitation Level of Care: The patient requires daily medical management by a physician with specialized training in physical medicine and rehabilitation for the following reasons: Direction of a multidisciplinary physical rehabilitation program to maximize functional independence : Yes Medical management of patient stability for increased activity during participation in an intensive rehabilitation regime.: Yes Analysis of laboratory values and/or radiology reports with any subsequent need for medication adjustment and/or medical intervention. : Yes   I attest that I was present, lead the team conference, and concur with the assessment and plan of the team.   Fredericka Barnie NOVAK 12/10/2023, 3:44 PM

## 2023-12-10 NOTE — Progress Notes (Signed)
 Occupational Therapy Session Note  Patient Details  Name: Jason Stevenson MRN: [id number] Date of Birth: 04/29/1985  Today's Date: 12/10/2023 OT Individual Time: 9154-9055 OT Individual Time Calculation (min): 59 min  And OT Individual Time: 1445-1535 OT Individual Time Calculation (min): 50 min   Short Term Goals: Week 2:  OT Short Term Goal 1 (Week 2): Pt will weave B LEs into pants using AE PRN with MIN A OT Short Term Goal 2 (Week 2): Pt will assisting in bringing pants to waist with MIN A provided for standing balance OT Short Term Goal 3 (Week 2): Pt will complete squat pivot transfers with MIN A OT Short Term Goal 4 (Week 2): Pt will complete UB dressing with MIN A  Skilled Therapeutic Interventions/Progress Updates:     AM Session: Pt received sitting up in bed presenting to be in good spirits receptive to skilled OT session reporting 0/10 pain- OT offering intermittent rest breaks, repositioning, and therapeutic support to optimize participation in therapy session. Pt requesting to take shower this AM- focused this session on ADL retraining to decrease overall bourdon of care.   Mobility:  -Bed Mobility: With HOB elevated, Pt transitioned to EOB with MOD A to bring L hemibody to EOB and verbal cues for technique.  -Stand Pivot: Pt completed stand pivot transfer EOB > wc R to with MIN A required for standing balance, facilitation of R LE, and verbal cues for technique. -TTB transfer: Pt completed stand pivot wc <> TTB using grab bars with MIN A required for standing balance, facilitation of R LE, and verbal cues for technique.  ADL:  -Dressing: Doffed clothing while seated on TTB. Pt able to doff OH shirt with shirt using hemi-technique with SUP. He stood with grab bar MIN A and able to doff pants from waist MIN provided for standing balance. Following shower, Pt completed U/LB dressing seated in wc. Donned OH shirt with MIN A this session using hemi-techniques with assistance  provided with weaving L UE. Pt then able to weave R UE and bring shirt OH. Pt donned pants using hemi-techniques using reacher with MOD A required for weaving L LE. Pt then able to wave R LE and stand to sink with MIN A to bring pants to waist with increased time.  -Bathing: U/B bathing completed seated on TTB. Pt able to utilize hemi-techniques to wash R underarm and long-handled sponge to wash lower B LEs. MOD verbal cues required for attention to task sequencing. Pt able to maintain sitting balance this session with SUP.   Pt was left resting in wc with call bell in reach, seatbelt alarm on, and all needs met.     PM Session: Pt received sitting up in wc presenting to be in good spirits receptive to skilled OT session reporting 0/10 pain, however pain in upper traps/neck noted later during session- OT offering intermittent rest breaks, repositioning, and therapeutic support to optimize participation in therapy session. Transported Pt total A to therapy gym in wc for time management and energy conservation. Began session by completing gentle prolonged stretch to Pt's LUE- flexor tone noted in Pt's digits/wrist improved with gentle stretching in combination with deep breathing. Facilitated NMR to L UE with Pt completing table glides moving through scapular protraction/retraction, elbow flexion/extension, and shoulder abd/adduction with OT providing verbal and tactile cues for muscle activation, technique, and to decrease compensatory body movements.  1:1 NMES applied to supraspinatus and middle deltoid to help approximate shoulder joint to reduce sublux and reduce  pain.  Ratio 1:3 Rate 35 pps Waveform- Asymmetric Ramp 1.0 Pulse 300 Intensity- 35 Ma Duration - 10 min No pain reported with intervention and skin was intact following.  Educated Pt on purpose of k-tape for increased muscle activation, proprioceptive input, and to decrease edema with Pt receptive to intervention. Donned K-tape to dorsal  of L hand and for arm with no adverse reactions noted. Will continue to assess.   Transported Pt back to room total A in wc. Pt was left resting in wc with call bell in reach, seatbelt alarm on, and all needs met.    Therapy Documentation Precautions:  Precautions Precautions: Fall Precaution/Restrictions Comments: L Hemiplegia Restrictions Weight Bearing Restrictions Per Provider Order: No   Therapy/Group: Individual Therapy  [name] 12/10/2023, 8:01 AM

## 2023-12-10 NOTE — Progress Notes (Signed)
 Physical Therapy Session Note  Patient Details  Name: Jason Stevenson MRN: [id number] Date of Birth: 02/20/1986  Today's Date: 12/09/2023 PT Individual Time: 0803-0918 PT Individual Time Calculation (min): 75 min  Short Term Goals: Week 1:  PT Short Term Goal 1 (Week 1): Pt willperform bed mobility with consistent ModA overall. PT Short Term Goal 1 - Progress (Week 1): Met PT Short Term Goal 2 (Week 1): Pt will perform sit<>stand transfers with no AD and modA. PT Short Term Goal 2 - Progress (Week 1): Progressing toward goal PT Short Term Goal 3 (Week 1): Pt will perform squat pivot transfers with MinA. PT Short Term Goal 3 - Progress (Week 1): Progressing toward goal PT Short Term Goal 4 (Week 1): Pt will perform stand pivot transfers with ModA +1. PT Short Term Goal 4 - Progress (Week 1): Progressing toward goal PT Short Term Goal 5 (Week 1): Pt will perform ambulation with MaxA +1 using RW for at least 25 ft. PT Short Term Goal 5 - Progress (Week 1): Progressing toward goal Week 2:  PT Short Term Goal 1 (Week 2): Pt will perform bed mobility with consistent ModA overall. PT Short Term Goal 2 (Week 2): Pt will perform sit<>stand transfers with no AD and modA. PT Short Term Goal 3 (Week 2): Pt will perform squat pivot transfers with MinA. PT Short Term Goal 4 (Week 2): Pt will perform stand pivot transfers with ModA +1. PT Short Term Goal 5 (Week 2): Pt will perform ambulation with MaxA +1 using RW for at least 25 ft.  Skilled Therapeutic Interventions/Progress Updates:  Patient supine in bed on entrance to room. Patient alert and agreeable to PT session.   Patient with no pain complaint at start of session.  Therapeutic Activity: Bed Mobility: Pt performed supine > sit attempting to demonstrate to pt to perform with sidelying technique. Pt initiates attempt to sit straight up after bringing RLE off EOB. Returned LE to bed surface and vc for attn to task and technique. CGA to roll to  sidelying and MinA to maintain sidelying as pt continues to roll back to talk to therapist about other topics. Continuous vc for attn to task. Once sidelying, pt able to kick feet off EOB with no physical assist. Then provided [facility] assist to place L palm on bed and MinA to push up to seated position. Indicated to pt that this technique allowed him to perform getting out of bed with less help than any other way. He shows understanding. Provided with reacher in attempt to have pt don pants, but unable to problem solve appropriate areas to push/ pull fabric in order to thread L foot into pant leg. Also does not follow instructions to don L leg first, making completion difficult. Requires ModA to complete.  Transfers: Squat pivot transfer performed toward L side into w/c with Min/ Mod A. Continued transfer training with NMR focus. Provided vc/ tc throughout for attn to task, positioning, sequencing, and LLE use. Is improving toward consistent MinA.   Gait Training:  Pt ambulated 61 ft using HW with overall MinA. Demonstrated improving L quad control during stance phase. Uncontrolled hyperextension initially but when PT holding knee in slight flexion during stance phase, pt demos ability to maintain light flexion with no buckle.Continued difficulty with initiation and control of foot placement with reduced motor control at hip. minA provided overall for completing advancement and more abducted hip positioning. Provided vc/ tc for sequencing, initiation, light facilitation of lateral weight shift.  Neuromuscular Re-ed: NMR facilitated during session with focus on standing balance, motor control, proprioception. Pt guided in sit<>stand to attaining/ maintaining balance over equal WB to BLE. Assisted in weight shifting. Also focus on squat pivot performance with use of lateral weight shift using hip/ pelvis lateral shift. Initially requires time and max cues with modA to initiate, but improves this session in ability  to perform toward L side with discussion of technique prior to performance. And requires vc to continue into good position in seat. Still Mod/ MinA overall.   NMR performed for improvements in motor control and coordination, balance, sequencing, judgement, and self confidence/ efficacy in performing all aspects of mobility at highest level of independence.   Patient seated in w/c at end of session with brakes locked, belt alarm set, and all needs within reach.   Therapy Documentation Precautions:  Precautions Precautions: Fall Precaution/Restrictions Comments: L Hemiplegia Restrictions Weight Bearing Restrictions Per Provider Order: No  Pain:  No pain related this session.   Therapy/Group: Individual Therapy  [name] PT, DPT, CSRS 12/09/2023, 5:05 PM

## 2023-12-10 NOTE — Progress Notes (Addendum)
 PROGRESS NOTE   Subjective/Complaints: No new complaints today.  No new concerns elicited.   Review of systems: Chest pain, abdominal pain, nausea, vomiting, constipation, diarrhea, joint pain  + R hand tingling/numbness-continued    Objective:   No results found.  Recent Labs    12/08/23 0501  WBC 6.1  HGB 9.7*  HCT 29.8*  PLT 272     Recent Labs    12/08/23 0501  NA 138  K 3.8  CL 107  CO2 25  GLUCOSE 173*  BUN 16  CREATININE 0.95  CALCIUM  9.1    Intake/Output Summary (Last 24 hours) at 12/10/2023 1034 Last data filed at 12/10/2023 9178 Gross per 24 hour  Intake 460 ml  Output 1000 ml  Net -540 ml        Physical Exam: Vital Signs Blood pressure (!) 146/96, pulse 89, temperature 97.7 F (36.5 C), resp. rate 16, height 5' 11 (1.803 m), weight 86.7 kg, SpO2 98%.  General: No acute distress HEENT trach site well healed  Pleasant affect Heart: RRR Lungs: Clear auscultation bilaterally, no increased  WOB Abdomen: Soft nontender nondistended positive bowel sounds Extremities: No edema noted Skin: No breakdown noted on visible portion + Left groin ECMO site now scabbed over  Neurologic: Awake and alert, cranial nerves II through XII grossly intact,, follows commands  Sensory exam normal sensation to light touch is reduced on the left side below the knee as well as in the left upper extremity  Altered sensation palmar 1st and 2nd digit right hand--unchanged  Musculoskeletal: No joint swelling , No hand tenderness today, no chest wall soreness noted   Physical exam unchanged from the above on reexamination 12/10/23     Prior Exam: motor strength is 4/5 in right deltoid, bicep, tricep, grip, hip flexor, knee extensors, ankle dorsiflexor and plantar flexor LUE 2 -/5 left deltoid, bicep, tricep, finger flexors and trace extensors. Left lower extremity 3 - hip flexor 4 - knee extensor 0 ankle  dorsiflexor and 2/5 plantar flexor  Assessment/Plan: 1. Functional deficits which require 3+ hours per day of interdisciplinary therapy in a comprehensive inpatient rehab setting. Physiatrist is providing close team supervision and 24 hour management of active medical problems listed below. Physiatrist and rehab team continue to assess barriers to discharge/monitor patient progress toward functional and medical goals  Care Tool:  Bathing    Body parts bathed by patient: Left lower leg, Left arm, Chest, Abdomen, Front perineal area, Right upper leg, Left upper leg, Face   Body parts bathed by helper: Buttocks, Left arm, Right arm     Bathing assist Assist Level: Moderate Assistance - Patient 50 - 74%     Upper Body Dressing/Undressing Upper body dressing   What is the patient wearing?: Pull over shirt    Upper body assist Assist Level: Moderate Assistance - Patient 50 - 74%    Lower Body Dressing/Undressing Lower body dressing      What is the patient wearing?: Pants, Underwear/pull up     Lower body assist Assist for lower body dressing: Maximal Assistance - Patient 25 - 49%     Toileting Toileting    Toileting assist Assist for  toileting: Maximal Assistance - Patient 25 - 49%     Transfers Chair/bed transfer  Transfers assist     Chair/bed transfer assist level: Moderate Assistance - Patient 50 - 74%     Locomotion Ambulation   Ambulation assist      Assist level: Maximal Assistance - Patient 25 - 49% Assistive device: Other (comment) (R hallway HR) Max distance: 15 ft   Walk 10 feet activity   Assist     Assist level: Maximal Assistance - Patient 25 - 49% Assistive device: Other (comment) (R hallway HR)   Walk 50 feet activity   Assist Walk 50 feet with 2 turns activity did not occur: Safety/medical concerns         Walk 150 feet activity   Assist Walk 150 feet activity did not occur: Safety/medical concerns         Walk 10  feet on uneven surface  activity   Assist Walk 10 feet on uneven surfaces activity did not occur: Safety/medical concerns         Wheelchair     Assist Is the patient using a wheelchair?: Yes Type of Wheelchair: Manual    Wheelchair assist level: Dependent - Patient 0% Max wheelchair distance: 250 ft    Wheelchair 50 feet with 2 turns activity    Assist        Assist Level: Dependent - Patient 0%   Wheelchair 150 feet activity     Assist      Assist Level: Dependent - Patient 0%   Blood pressure (!) 146/96, pulse 89, temperature 97.7 F (36.5 C), resp. rate 16, height 5' 11 (1.803 m), weight 86.7 kg, SpO2 98%.  Medical Problem List and Plan: 1. Functional deficits secondary to debility following PEA arrest             -patient may  shower             -ELOS/Goals: 18-21d MinA  -Continue CIR  - Expected discharge 9/2   2.  Antithrombotics: -DVT/anticoagulation:  Pharmaceutical: Lovenox              -antiplatelet therapy: N/A due to IPH 3. Pain Management: Tylenol  prn Been on Lyrica  at home for diabetic neuropathic pain. 4. Mood/Behavior/Sleep: LCSW to follow for evaluation and support.              -antipsychotic agents: N/A 5. Neuropsych/cognition: This patient is not capable of making decisions on his own behalf without input from [facility].  - Patient was seen by neuropsychology, appreciate insight 6. Skin/Wound Care: Routine pressure relief measures. Monitor incision for healing.    - 8/24: ECMO site scabbed over; stop aquacel. Informed nursing to keep dry given location  7. Fluids/Electrolytes/Nutrition: Strict I/O. Renal diet with CM restrictions --no fluid restriction needed 8. PEA arrest X 2/CMV viremia: on Valcyte  900 mg daily thru 10/26 with monitoring of levels every 2 weeks post completion.  Pharmacy consult  9. Heart transplant: On Gengraf  BID with prednisone  daily             --continue Coreg , lower dose Lipitor  and Zetia .  Bactrim  until  8/13 Heart Failure team will follow -appreciate assistance -Called [facility] [place] [facility] heart transplant, cyclosporine  level added for Monday.  -[name] discussed [employer] was contacted about his pacemaker and working correctly.  - 8/18 cyclosporin level down to 274, appears cardiology planning to recheck on tomorrow, this is scheduled  CMV levels positive but less than 200 viral load, cardiology continuing current  regimen with valganciclovir   Cyclosporine  level still pending  Appreciate cardiology/heart failure team assistance.   - 8/22: Per cardiology note: CMV titer returned with positive, <200 viral load. Will continue current oral therapy with valganciclovir . Will make further adjustments to cyclosporine  based on his titer. Timing of administration reviewed, should be an accurate trough.   -Patient remained hemodynamically stable over the weekend, cardiology last saw 8-22.     -8/25 Increase losartan  to 100mg , will notify cardiology, cyclosporine  level was 107 on 8/21  -8/25 Reviewed cardiology noted, cylosporine dose has been adjusted to 125mg  Qam and 100mg  at bedtime, continue current dose losartan   -8/26 fair control BP, continue current regimen and monitor      12/10/2023    4:32 AM 12/09/2023    9:19 PM 12/09/2023    7:18 PM  Vitals with BMI  Systolic 146 136   Diastolic 96 97   Pulse 89 96 79     10. Elevated phosphorous: Will keep on liberalized diet for food choices and to help with intake             --continue Phoslo  1334 tid ac--recheck level in am.  -8/14 Phos mildly elevated today, continue to monitor trend.  Continue PhosLo   Recheck levels--normalized 8-16  11. Hx seizures: continue Keppra  and Trileptal .   12. 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  Monitor BS ac/hs. Continue Insulin  glargine with novolog  tid ac  CBG (last 3)  Recent Labs    12/08/23 1621 12/09/23 1636 12/10/23 0623  GLUCAP 150* 140* 207*   Continue semglee  to 29U, diet changed to carb modified -8/19 last couple days CBGs a little lower in the morning and higher throughout the day, will decrease Semglee  to 26 units but increase mealtime insulin  from 5 units to 6 units. -8/21 increase aspart mealtime to 7 units 3 times daily for better control -8/22 better control overall, continue current regimen and monitor--controlled on current regimen 8-24: Low this a.m., reduce Semglee  from 26 to 20 units.  Meal coverage appears appropriate. 8/27 discussed with pharmacy, increase sliding scale to moderate  13. Anemia of chronic disease: Monitor H/H for stability.   -8/14 Hemoglobin stable at 9.6  Recheck tomorrow CBC  14. R hand numbness: Possible carpal tunnel  -continue carpal tunnel wrist splint use at night  - Consider outpatient EMG  -Voltaren  gel PRN- discussed with patient  - 8-23: Encouraged compliance with nighttime brace; can take 4 to 6 weeks to see benefit  15, Diastolic hypertension: 2grams IV mag ordered, coreg  increased to 25mg  BID -8/18 BP stable this morning Addendum BP appears elevated this evening, per chart review appears patient noted to be more lethargic and complaining of chest discomfort.  Appears PA has contacted cardiology and rapid response has been called.  - 8/19 cardiology has adjusted losartan  dose, possible increase again tomorrow.  Appreciate assistance  -8/20 cardiology increase losartan  to 50 mg, continue to monitor response  - 8/22, cardiology has increased losartan  dose to 75 mg, appreciate assistance--still slightly elevated, but will give a few days for adjustment.  8-24: Diastolic uptrending, otherwise stable.  Defer adjustment to cardiology in AM.  8/25 losartan  increased as above  8/27 Fair control, continue to monitor     12/10/2023    4:32 AM 12/09/2023    9:19 PM 12/09/2023    7:18 PM  Vitals with BMI  Systolic 146 136   Diastolic 96 97   Pulse 89 96 79  16.  Hypomagnesemia.  -  8/18 patient received IV mag over the weekend, started on mag oxide 400 mg daily by cardiology  - 8/19 IV magnesium  has been ordered by cardiology  -8/20 MG up to 1.6, continue supplementation  -8/22 magnesium  1.6, continue oral supplementation  8-24: Mag 1.5 last 2 days, but has stayed stable; increase supplement to 800 mg twice daily.  8/27 stable at 1.6, continue current supplementation  17 .  Mild AKI.  Baseline creatinine 0.8-0.9; was 1.14 last 8-22.  Will trend in AM.  Expect some adjustment with increase of losartan .  8-24: Creatinine back to baseline.   8/25 stable at creatinine 0.95 today  Recheck labs tomorrow    LOS: 16 days A FACE TO FACE EVALUATION WAS PERFORMED  Murray Collier 12/10/2023, 10:34 AM

## 2023-12-10 NOTE — Plan of Care (Signed)
  Problem: Consults Goal: RH GENERAL PATIENT EDUCATION Description: See Patient Education module for education specifics. Outcome: Progressing   Problem: RH BOWEL ELIMINATION Goal: RH STG MANAGE BOWEL WITH ASSISTANCE Description: STG Manage Bowel with mod I Assistance. Outcome: Progressing Goal: RH STG MANAGE BOWEL W/MEDICATION W/ASSISTANCE Description: STG Manage Bowel with Medication with mod I Assistance. Outcome: Progressing   Problem: RH BLADDER ELIMINATION Goal: RH STG MANAGE BLADDER WITH ASSISTANCE Description: STG Manage Bladder With mod I Assistance Outcome: Progressing   Problem: RH SKIN INTEGRITY Goal: RH STG SKIN FREE OF INFECTION/BREAKDOWN Description: Manage w min assist Outcome: Progressing   Problem: Health Behavior/Discharge Planning: Goal: Ability to identify and utilize available resources and services will improve Outcome: Progressing Goal: Ability to manage health-related needs will improve Outcome: Progressing   Problem: Metabolic: Goal: Ability to maintain appropriate glucose levels will improve Outcome: Progressing

## 2023-12-11 DIAGNOSIS — I10 Essential (primary) hypertension: Secondary | ICD-10-CM | POA: Diagnosis not present

## 2023-12-11 DIAGNOSIS — R790 Abnormal level of blood mineral: Secondary | ICD-10-CM | POA: Diagnosis not present

## 2023-12-11 DIAGNOSIS — R5381 Other malaise: Secondary | ICD-10-CM | POA: Diagnosis not present

## 2023-12-11 DIAGNOSIS — Z794 Long term (current) use of insulin: Secondary | ICD-10-CM | POA: Diagnosis not present

## 2023-12-11 DIAGNOSIS — D638 Anemia in other chronic diseases classified elsewhere: Secondary | ICD-10-CM | POA: Diagnosis not present

## 2023-12-11 DIAGNOSIS — E1142 Type 2 diabetes mellitus with diabetic polyneuropathy: Secondary | ICD-10-CM | POA: Diagnosis not present

## 2023-12-11 LAB — BASIC METABOLIC PANEL WITH GFR
Anion gap: 9 (ref 5–15)
BUN: 17 mg/dL (ref 6–20)
CO2: 26 mmol/L (ref 22–32)
Calcium: 9.2 mg/dL (ref 8.9–10.3)
Chloride: 106 mmol/L (ref 98–111)
Creatinine, Ser: 1 mg/dL (ref 0.61–1.24)
GFR, Estimated: >60 mL/min (ref >60)
Glucose, Bld: 206 mg/dL — ABNORMAL HIGH (ref 70–99)
Potassium: 4 mmol/L (ref 3.5–5.1)
Sodium: 141 mmol/L (ref 135–145)

## 2023-12-11 LAB — CBC
HCT: 29.7 % — ABNORMAL LOW (ref 39.0–52.0)
Hemoglobin: 9.8 g/dL — ABNORMAL LOW (ref 13.0–17.0)
MCH: 28.7 pg (ref 26.0–34.0)
MCHC: 33 g/dL (ref 30.0–36.0)
MCV: 86.8 fL (ref 80.0–100.0)
Platelets: 230 K/uL (ref 150–400)
RBC: 3.42 MIL/uL — ABNORMAL LOW (ref 4.22–5.81)
RDW: 14.9 % (ref 11.5–15.5)
WBC: 5 K/uL (ref 4.0–10.5)
nRBC: 0 % (ref 0.0–0.2)

## 2023-12-11 LAB — GLUCOSE, CAPILLARY
Glucose-Capillary: 207 mg/dL — ABNORMAL HIGH (ref 70–99)
Glucose-Capillary: 208 mg/dL — ABNORMAL HIGH (ref 70–99)
Glucose-Capillary: 138 mg/dL — ABNORMAL HIGH (ref 70–99)
Glucose-Capillary: 182 mg/dL — ABNORMAL HIGH (ref 70–99)

## 2023-12-11 LAB — MAGNESIUM: Magnesium: 1.6 mg/dL — ABNORMAL LOW (ref 1.7–2.4)

## 2023-12-11 MED ORDER — CYCLOSPORINE MODIFIED (GENGRAF) 25 MG PO CAPS
125.0000 mg | ORAL_CAPSULE | Freq: Every day | ORAL | Status: DC
  Administered 2023-12-11 – 2023-12-16 (×6): 125 mg via ORAL
  Filled 2023-12-11 (×6): qty 5

## 2023-12-11 MED ORDER — CYCLOSPORINE MODIFIED (GENGRAF) 25 MG PO CAPS: 125.0000 mg | ORAL_CAPSULE | Freq: Every day | ORAL | Status: DC

## 2023-12-11 MED ORDER — INSULIN GLARGINE 100 UNIT/ML ~~LOC~~ SOLN
22.0000 [IU] | Freq: Every day | SUBCUTANEOUS | Status: DC
  Administered 2023-12-11: 22 [IU] via SUBCUTANEOUS
  Filled 2023-12-11 (×2): qty 0.22

## 2023-12-11 NOTE — Progress Notes (Signed)
 Occupational Therapy Weekly Progress Note  Patient Details  Name: Jason Stevenson MRN: [id number] Date of Birth: Sep 22, 1985  Beginning of progress report period: November 25, 2023 End of progress report period: December 11, 2023  Today's Date: 12/11/2023 OT Individual Time: 9095-9040 OT Individual Time Calculation (min): 55 min    Patient has met 4 of 4 short term goals. Pt is steadily progressing towards reaching his LTGs. He is completing UB dressing MIN A, LB dressing MOD A, toileting MOD A, bathing MIN A, and squat pivot transfers with MIN A. He is motivated and participating in each therapy session. He is planning to d/c home with his wife who can provide 24/7 SUP an physical assistance. She is planning to participate in family education next week prior to d/c.   Patient continues to demonstrate the following deficits: muscle weakness and muscle joint tightness, decreased cardiorespiratoy endurance, impaired timing and sequencing, abnormal tone, unbalanced muscle activation, decreased coordination, and decreased motor planning, decreased motor planning, decreased initiation, decreased attention, decreased awareness, decreased problem solving, decreased safety awareness, and delayed processing, central origin, and decreased sitting balance, decreased standing balance, decreased postural control, hemiplegia, and decreased balance strategies and therefore will continue to benefit from skilled OT intervention to enhance overall performance with BADL and Reduce care partner burden.  Patient progressing toward long term goals..  Continue plan of care.  OT Short Term Goals Week 2:  OT Short Term Goal 1 (Week 2): Pt will weave B LEs into pants using AE PRN with MIN A OT Short Term Goal 1 - Progress (Week 2): Met OT Short Term Goal 2 (Week 2): Pt will assisting in bringing pants to waist with MIN A provided for standing balance OT Short Term Goal 2 - Progress (Week 2): Met OT Short Term Goal 3 (Week  2): Pt will complete squat pivot transfers with MIN A OT Short Term Goal 3 - Progress (Week 2): Met OT Short Term Goal 4 (Week 2): Pt will complete UB dressing with MIN A OT Short Term Goal 4 - Progress (Week 2): Met Week 3:  OT Short Term Goal 1 (Week 3): STG=LTG d/t ELOS  Skilled Therapeutic Interventions/Progress Updates:     Pt received sitting up in wc dressed an ready for the day with all ADL needs met. Pt presenting to be in good spirits receptive to skilled OT session reporting 0/10 pain- OT offering intermittent rest breaks, repositioning, and therapeutic support to optimize participation in therapy session. Focused this session on dynamic standing balance, L hemibody NMRE, and increasing overall confidence in standing position.  Attended e-stim:  -1:1 NMES applied to supraspinatus and middle deltoid to help approximate shoulder joint to reduce sublux and reduce pain.   Ratio 1:3 Rate 35 pps Waveform- Asymmetric Ramp 1.0 Pulse 300 Intensity- 38 Duration - 24 min  No pain reported with intervention and skin was intact.  NMRE:  -Engaged Pt in completing dynamic standing balance plinko activity with WB'ing facilitated through L hemibody for increased proprioceptive feedback and muscle activation. When reaching anteriorly to place pinko chip into slot, Pt tasked with weight shifting anteriorly into L L/UE and maintaining balance while placing chip with R UE. He was able to tolerate standing >10 minutes during activity with MIN A overall, OT stabilizing L knee and providing MOD verbal/tactile cues for muscle activation. Seated rest break following -In modified plantigrade position at elevated table top, engaged Pt in completing body on arm shoulder flexion while weight shifting anteriorly/posteriorly by hinging at  hips. He was able to tolerate completing 5 trials with OT stabilizing L LE and facilitating WB'ing L UE on table top. Some back pain reported during activity with seated rest  break provided following.   Unattended e-stim -Donned Saebo onto Pt's L deltoid at end of session to facilitate increased muscle activation, decrease pain, and to decrease shoulder subluxation. Pt able to tolerate Saebo running for 60 min with noted muscle activation. This OT returned to doff Saebo with no redness noted and skin was intact.   Pt was handed off to PT for next therapy session with all needs met.    Therapy Documentation Precautions:  Precautions Precautions: Fall Precaution/Restrictions Comments: L Hemiplegia Restrictions Weight Bearing Restrictions Per Provider Order: No   Therapy/Group: Individual Therapy  [name] 12/11/2023, 7:53 AM

## 2023-12-11 NOTE — Progress Notes (Signed)
 PROGRESS NOTE   Subjective/Complaints: No new complaints or concerns today.  Patient asks about Korene supplement he saw on TV. Looks like this has Alpha Lipoic Acid and B Vitamins.  Would not recommend adding supplement at this time to avoid possibility medication interactions.   Review of systems: Chest pain, abdominal pain, nausea, vomiting, constipation, diarrhea, joint pain  + R hand tingling/numbness-continued    Objective:   No results found.  Recent Labs    12/11/23 0455  WBC 5.0  HGB 9.8*  HCT 29.7*  PLT 230     Recent Labs    12/11/23 0455  NA 141  K 4.0  CL 106  CO2 26  GLUCOSE 206*  BUN 17  CREATININE 1.00  CALCIUM  9.2    Intake/Output Summary (Last 24 hours) at 12/11/2023 1729 Last data filed at 12/11/2023 1700 Gross per 24 hour  Intake 860 ml  Output 425 ml  Net 435 ml        Physical Exam: Vital Signs Blood pressure (!) 135/90, pulse 97, temperature (!) 97.5 F (36.4 C), resp. rate 16, height 5' 11 (1.803 m), weight 86.7 kg, SpO2 100%.  General: No acute distress, appears comfortable sitting in bed HEENT trach site well healed  Pleasant affect Heart: RRR Lungs: Clear auscultation bilaterally, no increased  WOB Abdomen: Soft nontender nondistended positive bowel sounds Extremities: No edema noted Skin: No breakdown noted on visible portion + Left groin ECMO site now scabbed over  Neurologic: Awake and alert, cranial nerves II through XII grossly intact,, follows commands  Sensory exam normal sensation to light touch is reduced on the left side below the knee as well as in the left upper extremity  Altered sensation palmar 1st and 2nd digit right hand--unchanged Motor weakness LUE>LLE Musculoskeletal: No joint swelling , No hand tenderness today, no chest wall soreness noted Wearing brace LUE Physical exam unchanged from the above on reexamination 12/11/23     Prior  Exam: motor strength is 4/5 in right deltoid, bicep, tricep, grip, hip flexor, knee extensors, ankle dorsiflexor and plantar flexor LUE 2 -/5 left deltoid, bicep, tricep, finger flexors and trace extensors. Left lower extremity 3 - hip flexor 4 - knee extensor 0 ankle dorsiflexor and 2/5 plantar flexor  Assessment/Plan: 1. Functional deficits which require 3+ hours per day of interdisciplinary therapy in a comprehensive inpatient rehab setting. Physiatrist is providing close team supervision and 24 hour management of active medical problems listed below. Physiatrist and rehab team continue to assess barriers to discharge/monitor patient progress toward functional and medical goals  Care Tool:  Bathing    Body parts bathed by patient: Left lower leg, Left arm, Chest, Abdomen, Front perineal area, Right upper leg, Left upper leg, Face   Body parts bathed by helper: Buttocks, Left arm, Right arm     Bathing assist Assist Level: Moderate Assistance - Patient 50 - 74%     Upper Body Dressing/Undressing Upper body dressing   What is the patient wearing?: Pull over shirt    Upper body assist Assist Level: Moderate Assistance - Patient 50 - 74%    Lower Body Dressing/Undressing Lower body dressing  What is the patient wearing?: Pants, Underwear/pull up     Lower body assist Assist for lower body dressing: Maximal Assistance - Patient 25 - 49%     Toileting Toileting    Toileting assist Assist for toileting: Maximal Assistance - Patient 25 - 49%     Transfers Chair/bed transfer  Transfers assist     Chair/bed transfer assist level: Moderate Assistance - Patient 50 - 74%     Locomotion Ambulation   Ambulation assist      Assist level: Maximal Assistance - Patient 25 - 49% Assistive device: Other (comment) (R hallway HR) Max distance: 15 ft   Walk 10 feet activity   Assist     Assist level: Maximal Assistance - Patient 25 - 49% Assistive device: Other  (comment) (R hallway HR)   Walk 50 feet activity   Assist Walk 50 feet with 2 turns activity did not occur: Safety/medical concerns         Walk 150 feet activity   Assist Walk 150 feet activity did not occur: Safety/medical concerns         Walk 10 feet on uneven surface  activity   Assist Walk 10 feet on uneven surfaces activity did not occur: Safety/medical concerns         Wheelchair     Assist Is the patient using a wheelchair?: Yes Type of Wheelchair: Manual    Wheelchair assist level: Dependent - Patient 0% Max wheelchair distance: 250 ft    Wheelchair 50 feet with 2 turns activity    Assist        Assist Level: Dependent - Patient 0%   Wheelchair 150 feet activity     Assist      Assist Level: Dependent - Patient 0%   Blood pressure (!) 135/90, pulse 97, temperature (!) 97.5 F (36.4 C), resp. rate 16, height 5' 11 (1.803 m), weight 86.7 kg, SpO2 100%.  Medical Problem List and Plan: 1. Functional deficits secondary to debility following PEA arrest             -patient may  shower             -ELOS/Goals: 18-21d MinA  -Continue CIR  - Expected discharge 9/2  2.  Antithrombotics: -DVT/anticoagulation:  Pharmaceutical: Lovenox              -antiplatelet therapy: N/A due to IPH 3. Pain Management: Tylenol  prn Been on Lyrica  at home for diabetic neuropathic pain. 4. Mood/Behavior/Sleep: LCSW to follow for evaluation and support.              -antipsychotic agents: N/A 5. Neuropsych/cognition: This patient is not capable of making decisions on his own behalf without input from [facility].  - Patient was seen by neuropsychology, appreciate insight 6. Skin/Wound Care: Routine pressure relief measures. Monitor incision for healing.    - 8/24: ECMO site scabbed over; stop aquacel. Informed nursing to keep dry given location  7. Fluids/Electrolytes/Nutrition: Strict I/O. Renal diet with CM restrictions --no fluid restriction needed 8.  PEA arrest X 2/CMV viremia: on Valcyte  900 mg daily thru 10/26 with monitoring of levels every 2 weeks post completion.  Pharmacy consult  9. Heart transplant: On Gengraf  BID with prednisone  daily             --continue Coreg , lower dose Lipitor  and Zetia .  Bactrim  until 8/13 Heart Failure team will follow -appreciate assistance -Called [facility] [place] [facility] heart transplant, cyclosporine  level added for Monday.  -[name] discussed  [employer] was contacted about his pacemaker and working correctly.  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   - 8/22: Per cardiology note: CMV titer returned with positive, <200 viral load. Will continue current oral therapy with valganciclovir . Will make further adjustments to cyclosporine  based on his titer. Timing of administration reviewed, should be an accurate trough.   -Patient remained hemodynamically stable over the weekend, cardiology last saw 8-22.     -8/25 Increase losartan  to 100mg , will notify cardiology, cyclosporine  level was 107 on 8/21  -8/25 Reviewed cardiology noted, cylosporine dose has been adjusted to 125mg  Qam and 100mg  at bedtime, continue current dose losartan   -8/28 continue to monitor BP, last note heart failure team on 8/26, appears cardiology rechecking cyclosporine  level   Vitals:   12/10/23 2047 12/11/23 0428 12/11/23 0700 12/11/23 1436  BP: (!) 158/93 (!) 145/95  (!) 135/90  Pulse: 97 91  97  Resp: 17 16  16   Temp: 98.7 F (37.1 C) 98.5 F (36.9 C)  (!) 97.5 F (36.4 C)  TempSrc:      SpO2: 100% 99% 99% 100%  Weight:      Height:           10. Elevated phosphorous: Will keep on liberalized diet for food choices and to help with intake             --continue  Phoslo  1334 tid ac--recheck level in am.  -8/14 Phos mildly elevated today, continue to monitor trend.  Continue PhosLo   Recheck levels--normalized 8-16  11. Hx seizures: continue Keppra  and Trileptal .   12. 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  Monitor BS ac/hs. Continue Insulin  glargine with novolog  tid ac  CBG (last 3)  Recent Labs    12/11/23 0610 12/11/23 1111 12/11/23 1648  GLUCAP 207* 208* 138*  Continue semglee  to 29U, diet changed to carb modified -8/19 last couple days CBGs a little lower in the morning and higher throughout the day, will decrease Semglee  to 26 units but increase mealtime insulin  from 5 units to 6 units. -8/21 increase aspart mealtime to 7 units 3 times daily for better control -8/22 better control overall, continue current regimen and monitor--controlled on current regimen 8-24: Low this a.m., reduce Semglee  from 26 to 20 units.  Meal coverage appears appropriate. 8/27 discussed with pharmacy, increase sliding scale to moderate 8/28 increase Semglee  to 22 units  13. Anemia of chronic disease: Monitor H/H for stability.   -8/14 Hemoglobin stable at 9.6  -8/28 hemoglobin stable at 9.8  14. R hand numbness: Possible carpal tunnel  -continue carpal tunnel wrist splint use at night  - Consider outpatient EMG  -Voltaren  gel PRN- discussed with patient  - 8-23: Encouraged compliance with nighttime brace; can take 4 to 6 weeks to see benefit  15, Diastolic hypertension: 2grams IV mag ordered, coreg  increased to 25mg  BID -8/18 BP stable this morning Addendum BP appears elevated this evening, per chart review appears patient noted to be more lethargic and complaining of chest discomfort.  Appears PA has contacted cardiology and rapid response has been called.  - 8/19 cardiology has adjusted losartan  dose, possible increase again tomorrow.  Appreciate assistance  -8/20 cardiology increase losartan  to 50 mg, continue to  monitor response  -  8/22, cardiology has increased losartan  dose to 75 mg, appreciate assistance--still slightly elevated, but will give a few days for adjustment.  8-24: Diastolic uptrending, otherwise stable.  Defer adjustment to cardiology in AM.  8/25 losartan  increased as above  8/27 Fair control, continue to monitor     12/11/2023    2:36 PM 12/11/2023    4:28 AM 12/10/2023    8:47 PM  Vitals with BMI  Systolic 135 145 841  Diastolic 90 95 93  Pulse 97 91 97     16.  Hypomagnesemia.  - 8/18 patient received IV mag over the weekend, started on mag oxide 400 mg daily by cardiology  - 8/19 IV magnesium  has been ordered by cardiology  -8/20 MG up to 1.6, continue supplementation  -8/22 magnesium  1.6, continue oral supplementation  8-24: Mag 1.5 last 2 days, but has stayed stable; increase supplement to 800 mg twice daily.  8/27-8 stable at 1.6, continue current supplementation  17 .  Mild AKI.  Baseline creatinine 0.8-0.9; was 1.14 last 8-22.  Will trend in AM.  Expect some adjustment with increase of losartan .  8-24: Creatinine back to baseline.   8/25 stable at creatinine 0.95 today  8/28 creatinine a little higher but overall stable at 1.0    LOS: 17 days A FACE TO FACE EVALUATION WAS PERFORMED  Murray Collier 12/11/2023, 5:29 PM

## 2023-12-11 NOTE — Progress Notes (Signed)
 Physical Therapy Session Note  Patient Details  Name: Jason Stevenson MRN: [id number] Date of Birth: [birth year]  Today's Date: 12/11/2023 PT Individual Time: 1420-1540 PT Individual Time Calculation (min): 80 min   Short Term Goals: Week 2:  PT Short Term Goal 1 (Week 2): Pt will perform bed mobility with consistent ModA overall. PT Short Term Goal 2 (Week 2): Pt will perform sit<>stand transfers with no AD and modA. PT Short Term Goal 3 (Week 2): Pt will perform squat pivot transfers with MinA. PT Short Term Goal 4 (Week 2): Pt will perform stand pivot transfers with ModA +1. PT Short Term Goal 5 (Week 2): Pt will perform ambulation with MaxA +1 using RW for at least 25 ft.  Skilled Therapeutic Interventions/Progress Updates: Pt presented in bed sleeping but easily aroused and agreeable to therapy. Pt states mild soreness in RLE, no intervention requested. Pt required minA for supine to sit from flat bed for truncal support. Pt completed squat pivot transfer to w/c with modA. Pt transported to main gym for time management and pt completed stand step transfer with HW and minA to R to high/low mat. At mat pt completed Sit to stand from elevated mat with minA and RLE on 2in step for increased LLE ms recruitment. Pt required verbal cues for sustained task and completion of task. Pt worked on toe taps to target on LLE with emphasis on lifting LLE back to neutral vs dragging foot. Pt also performed activity placing L foot on 2 in step for increased hip flexor recruitment. Pt required max multimodal cues for increasing abduction. Pt then moved to hallway and ambulated ~46ft with HW and minA with pt using visual cue of green line to increase BOS. Pt noted to self correct occasionally. Pt transported remaining distance back to room and completed stand step transfer with HW to R to bed. PTA doffed shoes and pt completed sit to supine with supervision and bed flat. Pt required minA to reposition to comfort. Pt  left in bed at end of session with bed alarm on, call bell within reach and needs met.      Therapy Documentation Precautions:  Precautions Precautions: Fall Precaution/Restrictions Comments: L Hemiplegia Restrictions Weight Bearing Restrictions Per Provider Order: No General:   Vital Signs: Therapy Vitals Temp: (!) 97.5 F (36.4 C) Pulse Rate: 97 Resp: 16 BP: (!) 135/90 Patient Position (if appropriate): Sitting Oxygen Therapy SpO2: 100 % O2 Device: Room Air   Therapy/Group: Individual Therapy  Kaleiyah Polsky 12/11/2023, 4:26 PM

## 2023-12-11 NOTE — Progress Notes (Signed)
 Physical Therapy Session Note  Patient Details  Name: Jason Stevenson MRN: [id number] Date of Birth: 1985/06/29  Today's Date: 12/11/2023 PT Individual Time: 1000-1050 PT Individual Time Calculation (min): 50 min   Short Term Goals: Week 2:  PT Short Term Goal 1 (Week 2): Pt will perform bed mobility with consistent ModA overall. PT Short Term Goal 2 (Week 2): Pt will perform sit<>stand transfers with no AD and modA. PT Short Term Goal 3 (Week 2): Pt will perform squat pivot transfers with MinA. PT Short Term Goal 4 (Week 2): Pt will perform stand pivot transfers with ModA +1. PT Short Term Goal 5 (Week 2): Pt will perform ambulation with MaxA +1 using RW for at least 25 ft.  Skilled Therapeutic Interventions/Progress Updates:    Pt seated in w/c on arrival and agreeable to therapy. Recd as hand off from OT. Pt transported to therapy gym for time management and energy conservation. No complaint of pain.   Session focused on gait training with hemi Walker. Pt ambulated 3 x 25-30 ft with min a overall and HW with w/c follow for safety (would not be required if unavailable). Pt demoes LLE maintained in flexed knee posture but did not buckle. With DF assist wrap, pt was able to advance LE and achieve step through gait pattern. Pt cued to look up, noted increased effort from lack of visual cue for proprioception, but was able to achieve!   Pt returned to room and remained in w/c, was left with all needs in reach and alarm active.    Therapy Documentation Precautions:  Precautions Precautions: Fall Precaution/Restrictions Comments: L Hemiplegia Restrictions Weight Bearing Restrictions Per Provider Order: No General:       Therapy/Group: Individual Therapy  Jason Stevenson 12/11/2023, 12:59 PM

## 2023-12-11 NOTE — Progress Notes (Signed)
 Speech Language Pathology Daily Session Note  Patient Details  Name: Jason Stevenson MRN: [id number] Date of Birth: 05-24-1985  Today's Date: 12/11/2023 SLP Individual Time: 0730-0815 SLP Individual Time Calculation (min): 45 min  Short Term Goals: Week 3: SLP Short Term Goal 1 (Week 3): STG = LTG due to ELOS  Skilled Therapeutic Interventions: Patient asleep upon SLP arrival, though easily aroused. SLP targeted speech intelligibility and cognitive goals through review of speech strategies. Patient recalled 2/4 independently and recalled re-education on remainder. SLP provided handout to aid in recall. SLP encouraged patient to utilize strategies during structured job interview task. Patient approximately 95% intelligible at the conversational level. SLP targeted cognitive goals through providing supervision-minA for thought organization and sustained attention during task. Patient left in bed with alarm set and call bell in reach. Continue POC.   Pain None reported  Therapy/Group: Individual Therapy  Shenita Trego M.A., CCC-SLP 12/11/2023, 7:18 AM

## 2023-12-12 DIAGNOSIS — R5381 Other malaise: Secondary | ICD-10-CM | POA: Diagnosis not present

## 2023-12-12 DIAGNOSIS — Z941 Heart transplant status: Secondary | ICD-10-CM | POA: Diagnosis not present

## 2023-12-12 DIAGNOSIS — I10 Essential (primary) hypertension: Secondary | ICD-10-CM | POA: Diagnosis not present

## 2023-12-12 DIAGNOSIS — E11649 Type 2 diabetes mellitus with hypoglycemia without coma: Secondary | ICD-10-CM | POA: Diagnosis not present

## 2023-12-12 DIAGNOSIS — Z794 Long term (current) use of insulin: Secondary | ICD-10-CM | POA: Diagnosis not present

## 2023-12-12 LAB — GLUCOSE, CAPILLARY
Glucose-Capillary: 92 mg/dL (ref 70–99)
Glucose-Capillary: 67 mg/dL — ABNORMAL LOW (ref 70–99)
Glucose-Capillary: 200 mg/dL — ABNORMAL HIGH (ref 70–99)
Glucose-Capillary: 202 mg/dL — ABNORMAL HIGH (ref 70–99)
Glucose-Capillary: 153 mg/dL — ABNORMAL HIGH (ref 70–99)

## 2023-12-12 MED ORDER — INSULIN GLARGINE 100 UNIT/ML ~~LOC~~ SOLN
20.0000 [IU] | Freq: Every day | SUBCUTANEOUS | Status: DC
  Administered 2023-12-12 – 2023-12-15 (×4): 20 [IU] via SUBCUTANEOUS
  Filled 2023-12-12 (×5): qty 0.2

## 2023-12-12 MED ORDER — INSULIN ASPART 100 UNIT/ML IJ SOLN
7.0000 [IU] | Freq: Three times a day (TID) | INTRAMUSCULAR | Status: DC
  Administered 2023-12-12 – 2023-12-13 (×2): 7 [IU] via SUBCUTANEOUS

## 2023-12-12 MED ORDER — INSULIN ASPART 100 UNIT/ML IJ SOLN
8.0000 [IU] | Freq: Three times a day (TID) | INTRAMUSCULAR | Status: DC
  Administered 2023-12-12: 8 [IU] via SUBCUTANEOUS

## 2023-12-12 NOTE — Progress Notes (Signed)
 Physical Therapy Session Note  Patient Details  Name: Jason Stevenson MRN: [id number] Date of Birth: [birth year]  Today's Date: 12/12/2023 PT Individual Time: 1100-1215 PT Individual Time Calculation (min): 75 min   Short Term Goals: Week 1:  PT Short Term Goal 1 (Week 1): Pt willperform bed mobility with consistent ModA overall. PT Short Term Goal 1 - Progress (Week 1): Met PT Short Term Goal 2 (Week 1): Pt will perform sit<>stand transfers with no AD and modA. PT Short Term Goal 2 - Progress (Week 1): Progressing toward goal PT Short Term Goal 3 (Week 1): Pt will perform squat pivot transfers with MinA. PT Short Term Goal 3 - Progress (Week 1): Progressing toward goal PT Short Term Goal 4 (Week 1): Pt will perform stand pivot transfers with ModA +1. PT Short Term Goal 4 - Progress (Week 1): Progressing toward goal PT Short Term Goal 5 (Week 1): Pt will perform ambulation with MaxA +1 using RW for at least 25 ft. PT Short Term Goal 5 - Progress (Week 1): Progressing toward goal Week 2:  PT Short Term Goal 1 (Week 2): Pt will perform bed mobility with consistent ModA overall. PT Short Term Goal 1 - Progress (Week 2): Met PT Short Term Goal 2 (Week 2): Pt will perform sit<>stand transfers with no AD and modA. PT Short Term Goal 2 - Progress (Week 2): Met PT Short Term Goal 3 (Week 2): Pt will perform squat pivot transfers with MinA. PT Short Term Goal 3 - Progress (Week 2): Met PT Short Term Goal 4 (Week 2): Pt will perform stand pivot transfers with ModA +1. PT Short Term Goal 4 - Progress (Week 2): Met PT Short Term Goal 5 (Week 2): Pt will perform ambulation with MaxA +1 using RW for at least 25 ft. PT Short Term Goal 5 - Progress (Week 2): Met Week 3:  PT Short Term Goal 1 (Week 3): STG = LTG d/t ELOS  Skilled Therapeutic Interventions/Progress Updates:  Patient seated upright in w/c on entrance to room. Patient alert and agreeable to PT session.   Patient with no pain complaint  at start of session.  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   Lifelong user of MWC? Yes  Will require a custom ultra lightweight chair to achieve independence with mobility.    He currently has K0001 following stroke in April of 2025. D/t occurrence of urinary retention/ incontinent accidents, pt will require a skin protectant/positioning cushion.   Selinda Bach BS, ATP present for custom manual wheelchair evaluation.    Therapists, patient, and ATP discussed the following necessities for pt's custom wheelchair:   18in wide x 18in depth wheelchair decision to select an ultra-lightweight [id number] manual wheelchair as opposed to lesser wheelchair in order to allow a lighter weight frame to decrease burden of care on patient and family while also allowing pt more independence with more efficient propulsion by  Need for padded 1/2 lap tray for appropriate LUE support to improve joint alignment while also allowing pt opportunities to utilize LUE for NMR Padded standard seat belt to provide pelvic stability as pt tends to have significant posterior pelvic tilt causing him to slide forward in w/c placing him at risk for falling 2* degrees of camber to allow pt increased independence with turning using RUE and BLE for propulsion and maneuvering 1in dump in the wheelchair to decrease risk of posterior pelvic tilt  and improve pt's stability to decrease risk of falling out of the chair Gel foam contoured cushion for pressure relief and gentle assist in positioning BLE contoured back for lateral trunk support due to decreased sitting balance/ trunk control   Pt in agreement with the above recommendations. ATP planning to provide loaner wheelchair Tuesday 9/2 to patient's home and will contact wife for details to setup time.   Wheelchair Mobility:  Pt propelled wheelchair 60 feet with supervision/ CGA. Provided  vc/ tc for technique and especially to improve awareness of positioning of LLE for efficient ability to step forward.   Neuromuscular Re-ed: NMR facilitated during session with focus on standing balance. Pt guided in rise to stand with focus on midline orientation and increased use of LLE with slight L sided bias. VC to maintain motor control to LLE. Pt standing to upright posture and no AD with minA/ CGA. Descent to sit with heavy CGA for controlling complete descent. Blocked practice for improving LLE motor control. NMR performed for improvements in motor control and coordination, balance, sequencing, judgement, and self confidence/ efficacy in performing all aspects of mobility at highest level of independence.   Patient seated upright in w/c at end of session with brakes locked, belt alarm set, and all needs within reach.  Therapy Documentation Precautions:  Precautions Precautions: Fall Precaution/Restrictions Comments: L Hemiplegia Restrictions Weight Bearing Restrictions Per Provider Order: No  Pain:  No pain related this session.    Therapy/Group: Individual Therapy  [name] PT, DPT, CSRS 12/12/2023, 6:33 PM

## 2023-12-12 NOTE — Progress Notes (Signed)
 Physical Therapy Session Note  Patient Details  Name: Jason Stevenson MRN: [id number] Date of Birth: 08-30-1985  Today's Date: 12/12/2023 PT Individual Time: 9092-9066 PT Individual Time Calculation (min): 26 min   Short Term Goals: Week 2:  PT Short Term Goal 1 (Week 2): Pt will perform bed mobility with consistent ModA overall. PT Short Term Goal 2 (Week 2): Pt will perform sit<>stand transfers with no AD and modA. PT Short Term Goal 3 (Week 2): Pt will perform squat pivot transfers with MinA. PT Short Term Goal 4 (Week 2): Pt will perform stand pivot transfers with ModA +1. PT Short Term Goal 5 (Week 2): Pt will perform ambulation with MaxA +1 using RW for at least 25 ft.  Skilled Therapeutic Interventions/Progress Updates: Patient supine in bed on entrance to room. Patient alert and agreeable to PT session.   Patient with no complaints of pain during session.   Therapeutic Activity: Bed Mobility: Pt performed supine<sit on EOB with close supervision and pt use of R LE to assist in advancing L LE off of bed. Pt required min/modA to scoot to EOB. Transfers: Pt performed sit<>stand pivot transfer from EOB<WC with min/modA and with HW in R UE. Provided VC for step placement and weight shifting accordingly.Pt required increased time to pivot. Pt transported from room<>day room gym in [facility] dependently for time management.   Neuromuscular Re-ed: NMR facilitated during session with focus on static standing balance, weight shift, neuromuscular connection/control of L affected side. - Static standing balance without UE support for 2 min with cues to shift weight to L LE per slightly R lean bias. Pt required close supervision - PTA passively moved L LE into seated hip flexion with VC for pt to control eccentric. Pt with 5lb ankle weight donned. Pt required mod occasionally maxA to control eccentric  NMR performed for improvements in motor control and coordination, balance, sequencing, judgement,  and self confidence/ efficacy in performing all aspects of mobility at highest level of independence.   Patient sitting in WC at end of session with brakes locked, belt alarm set, and all needs within reach.      Therapy Documentation Precautions:  Precautions Precautions: Fall Precaution/Restrictions Comments: L Hemiplegia Restrictions Weight Bearing Restrictions Per Provider Order: No  Therapy/Group: Individual Therapy  Aydan Levitz PTA 12/12/2023, 11:15 AM

## 2023-12-12 NOTE — Progress Notes (Signed)
 Advanced Heart Failure Rounding Note  Cardiologist: Maude Emmer, MD  HF Cardiologist: Dr. Rolan Chief Complaint: OHT, Assistance with immunosuppression Subjective:    - Participating in PT this morning.   Objective:    Weight Range: 89.5 kg Body mass index is 27.52 kg/m.   Vital Signs:   Temp:  [97.5 F (36.4 C)-98.3 F (36.8 C)] 98.3 F (36.8 C) (08/29 0434) Pulse Rate:  [88-97] 88 (08/29 0434) Resp:  [16] 16 (08/29 0434) BP: (135-146)/(86-97) 135/86 (08/29 0434) SpO2:  [95 %-100 %] 95 % (08/29 0434) Weight:  [89.5 kg] 89.5 kg (08/29 0434) Last BM Date : 12/11/23  Weight change: Filed Weights   12/08/23 0451 12/09/23 0527 12/12/23 0434  Weight: 85.2 kg 86.7 kg 89.5 kg   Intake/Output:  Intake/Output Summary (Last 24 hours) at 12/12/2023 1130 Last data filed at 12/12/2023 0816 Gross per 24 hour  Intake 602 ml  Output 875 ml  Net -273 ml    Physical Exam   General:  NAD Neck: JVP flat Cor: RRR Lungs: normal work of breathing Extremities: no edema  Neuro: alert & oriented x 3. Affect pleasant.   Labs    CBC Recent Labs    12/11/23 0455  WBC 5.0  HGB 9.8*  HCT 29.7*  MCV 86.8  PLT 230   Basic Metabolic Panel Recent Labs    91/72/74 0427 12/11/23 0455  NA  --  141  K  --  4.0  CL  --  106  CO2  --  26  GLUCOSE  --  206*  BUN  --  17  CREATININE  --  1.00  CALCIUM   --  9.2  MG 1.6* 1.6*   Medications:    Scheduled Medications:  atorvastatin   10 mg Oral Q supper   budesonide  (PULMICORT ) nebulizer solution  0.25 mg Nebulization BID   calcium  acetate  1,334 mg Oral TID WC   carvedilol   25 mg Oral BID WC   cycloSPORINE  modified  100 mg Oral QPM   cycloSPORINE  modified  125 mg Oral Daily   ezetimibe   10 mg Oral Daily   heparin  injection (subcutaneous)  5,000 Units Subcutaneous Q8H   insulin  aspart  0-15 Units Subcutaneous TID WC   insulin  aspart  0-5 Units Subcutaneous QHS   insulin  aspart  8 Units Subcutaneous TID WC   insulin   glargine  20 Units Subcutaneous QHS   levETIRAcetam   1,500 mg Oral BID   lidocaine   2 patch Transdermal Q24H   losartan   100 mg Oral Daily   magnesium  oxide  800 mg Oral BID   melatonin  3 mg Oral QHS   OXcarbazepine   300 mg Oral Daily   OXcarbazepine   600 mg Oral QHS   pantoprazole   40 mg Oral Daily   predniSONE   5 mg Oral Q breakfast   pregabalin   75 mg Oral QHS   Ensure Max Protein  11 oz Oral BID   protein supplement  1 Scoop Oral TID WC   traZODone   50 mg Oral QHS   valGANciclovir   900 mg Oral Daily   Infusions:  PRN Medications: acetaminophen , alum & mag hydroxide-simeth, bisacodyl , diclofenac  Sodium, diphenhydrAMINE , guaiFENesin -dextromethorphan, ipratropium-albuterol , prochlorperazine  **OR** prochlorperazine  **OR** prochlorperazine , senna-docusate, sodium phosphate  Patient Profile   38 y.o. 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   Transferred to  CIR 11/24/23 after prolonged hospitalization at [facility] w/ CMV viremia c/b PEA arrest, subsequent VA ECMO cannulation >>VV ECMO and eventual trach.  Assessment/Plan   HFrEF d/t NICM s/p OHT 8/24 - s/p OHT 11/26/22. Last EMBx in 3/25 negative with normal EF.  - TTE pre-arrest with EF 40% and severely reduced RV function - s/p PEA arrest (see below) - RHC 10/15/23 with normal CI and elevated PCWP. EMBx taken 7/2 - TTE 7/29 with normal biV function.  - Completed Bactrim   11/26/23 - continue prednisone  5 mg daily - continue atorva 10 mg at bedtime + zetia  10 mg daily - continue coreg  25 mg bid - continue losartan  100mg  daily;  - CSA level subtherapeutic; increase to 125mg  qAM and 100mg  at bedtime. Discussed with [facility] & pharmD. No clinical signs of infection at this time. - repeat cyclosporine  level pending. Will adjust immunosuppression accordingly.  Otherwise doing well; euvolemic on exam. Participating with PT during my exam. Mildly hypertensive. Will monitor.    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    PEA arrest on 10/01/23 - s/p VA ECLS transitioned to VV ECMO 6/21. De-cannulated 6/25. Trach placed.   - presumed respiratory etiology, possibly d/t unwitnessed seizure - ESBL Kleb HCAP s/p meropenem  - trach de-cannulated 8/4   H/o CVA - stroke with hemorrhagic conversion in 2022 and again in 4/25 - residual L sided deficits and seizures - continue keppra  and trileptal   - Appreciate CIR rehab  HTN - plan as above.   Length of Stay: 18  Rawson Minix, DO  12/12/2023, 11:30 AM  Advanced Heart Failure Team Pager [phone] (M-F; 7a - 5p)  Please contact [facility] for night-coverage after hours (5p -7a ) and weekends on [email or web address]

## 2023-12-12 NOTE — Progress Notes (Signed)
 Speech Language Pathology Daily Session Note  Patient Details  Name: Jason Stevenson MRN: [id number] Date of Birth: 1985-11-29  Today's Date: 12/12/2023 SLP Individual Time: 8595-8565 SLP Individual Time Calculation (min): 30 min  Short Term Goals: Week 3: SLP Short Term Goal 1 (Week 3): STG = LTG due to ELOS  Skilled Therapeutic Interventions: Skilled therapy session focused on family education. SLP educated patient and wife on patients current level of cognitive functioning and SLP POC. SLP provided examples of cognitive tasks to continue at home including supervision with medication management, crossword puzzles and socialization. SLP then re-evaluated patients maximum expiratory prssure which was 40cm H2O, an increase since evaluation. Patient independently recalled speech intelligibility strateiges. Patient left in chair with alarm set and call bell in reach. Continue POC.   Pain Denies  Therapy/Group: Individual Therapy  Lilyanne Mcquown M.A., CCC-SLP 12/12/2023, 7:41 AM

## 2023-12-12 NOTE — Progress Notes (Signed)
 Physical Therapy Weekly Progress Note  Patient Details  Name: Jason Stevenson MRN: [id number] Date of Birth: 05-Mar-1986  Beginning of progress report period: December 04, 2023 End of progress report period: December 12, 2023  Today's Date: 12/12/2023 PT Individual Time: 1502-1628 PT Individual Time Calculation (min): 86 min   Patient has met 5 of 5 short term goals.  Pt making appropriate progress towards goals and is on track to meet LTG. He has not missed any therapy and is motivated to continue progress. He can complete bed mobility with extensive vc for focus on technique and CGA/ minA, sit<>stand and stand pivot transfers with CGA/ MinA using  HW. This week he has progressed significantly with gait training and is ambulating 25-23ft regularly but up to 61 ft in single bout with MinA using HW. He has also shown ability to navigate four 6-in steps with ModA using R handrail. He continues to be primarily limited by lack of focus and reduced attention to task, decreased dynamic standing balance, RUE > RLE weakness, and gait impairments. Caregiver/ family (wife) has participated in family education to prepare for d/c home.   Patient continues to demonstrate the following deficits muscle weakness, decreased cardiorespiratoy endurance, impaired timing and sequencing, unbalanced muscle activation, and decreased coordination, decreased midline orientation, decreased attention to left, and decreased motor planning, decreased attention, decreased awareness, decreased problem solving, decreased safety awareness, decreased memory, and delayed processing, and decreased standing balance, decreased postural control, hemiplegia, and decreased balance strategies and therefore will continue to benefit from skilled PT intervention to increase functional independence with mobility.  Patient progressing toward long term goals.  Continue plan of care.  PT Short Term Goals Week 1:  PT Short Term Goal 1 (Week 1): Pt  willperform bed mobility with consistent ModA overall. PT Short Term Goal 1 - Progress (Week 1): Met PT Short Term Goal 2 (Week 1): Pt will perform sit<>stand transfers with no AD and modA. PT Short Term Goal 2 - Progress (Week 1): Progressing toward goal PT Short Term Goal 3 (Week 1): Pt will perform squat pivot transfers with MinA. PT Short Term Goal 3 - Progress (Week 1): Progressing toward goal PT Short Term Goal 4 (Week 1): Pt will perform stand pivot transfers with ModA +1. PT Short Term Goal 4 - Progress (Week 1): Progressing toward goal PT Short Term Goal 5 (Week 1): Pt will perform ambulation with MaxA +1 using RW for at least 25 ft. PT Short Term Goal 5 - Progress (Week 1): Progressing toward goal Week 2:  PT Short Term Goal 1 (Week 2): Pt will perform bed mobility with consistent ModA overall. PT Short Term Goal 1 - Progress (Week 2): Met PT Short Term Goal 2 (Week 2): Pt will perform sit<>stand transfers with no AD and modA. PT Short Term Goal 2 - Progress (Week 2): Met PT Short Term Goal 3 (Week 2): Pt will perform squat pivot transfers with MinA. PT Short Term Goal 3 - Progress (Week 2): Met PT Short Term Goal 4 (Week 2): Pt will perform stand pivot transfers with ModA +1. PT Short Term Goal 4 - Progress (Week 2): Met PT Short Term Goal 5 (Week 2): Pt will perform ambulation with MaxA +1 using RW for at least 25 ft. PT Short Term Goal 5 - Progress (Week 2): Met Week 3:  PT Short Term Goal 1 (Week 3): STG = LTG d/t ELOS  Skilled Therapeutic Interventions/Progress Updates:  Ambulation/gait training;Cognitive remediation/compensation;Discharge planning;DME/adaptive equipment instruction;Functional mobility  training;Pain management;Psychosocial support;Splinting/orthotics;Therapeutic Activities;UE/LE Strength taining/ROM;Visual/perceptual remediation/compensation;Wheelchair propulsion/positioning;UE/LE Coordination activities;Therapeutic Exercise;Skin care/wound management;Stair  training;Patient/family education;Neuromuscular re-education;Functional electrical stimulation;Disease management/prevention;Community reintegration;Balance/vestibular training   Therapy Documentation Precautions:  Precautions Precautions: Fall Precaution/Restrictions Comments: L Hemiplegia Restrictions Weight Bearing Restrictions Per Provider Order: No  Pain: Pt has complained of minimal pain during this period that has been address with low grade pain medication and repositioning/ rest.   Therapy/Group: Individual Therapy  [name] PT, DPT, CSRS 12/12/2023, 6:43 PM

## 2023-12-12 NOTE — Progress Notes (Addendum)
 PROGRESS NOTE   Subjective/Complaints: No new concerns or complaints today.   Review of systems: Chest chills, HA, abdominal pain, nausea, vomiting, constipation, diarrhea, joint pain  + R hand tingling/numbness-continued    Objective:   No results found.  Recent Labs    12/11/23 0455  WBC 5.0  HGB 9.8*  HCT 29.7*  PLT 230     Recent Labs    12/11/23 0455  NA 141  K 4.0  CL 106  CO2 26  GLUCOSE 206*  BUN 17  CREATININE 1.00  CALCIUM  9.2    Intake/Output Summary (Last 24 hours) at 12/12/2023 1553 Last data filed at 12/12/2023 0816 Gross per 24 hour  Intake 222 ml  Output 675 ml  Net -453 ml        Physical Exam: Vital Signs Blood pressure 114/83, pulse 94, temperature 98.2 F (36.8 C), resp. rate 16, height 5' 11 (1.803 m), weight 89.5 kg, SpO2 99%.  General: No acute distress, appears comfortable sitting in bed HEENT trach site well healed  Pleasant affect Heart: RRR Lungs: Clear auscultation bilaterally, no increased  WOB Abdomen: Soft nontender nondistended positive bowel sounds Extremities: No edema noted Skin: No breakdown noted on visible portion + Left groin ECMO site now scabbed over  Neurologic: Awake and alert, cranial nerves II through XII grossly intact,, follows commands  Sensory exam normal sensation to light touch is reduced on the left side below the knee as well as in the left upper extremity  Altered sensation palmar 1st and 2nd digit right hand--unchanged Motor weakness LUE>LLE Musculoskeletal: No joint swelling , No hand tenderness today, no chest wall soreness noted Wearing brace LUE 4/5 right upper and lower extremities strength Left upper extremity 1-2 out of 5 Left lower extremity hip flexion 3 out of 5, knee extension 4-/5, ankle plantarflexion 2 out of 5, ankle dorsiflexion 0-1 out of 5 Physical exam unchanged from the above on reexamination 12/12/23      Assessment/Plan: 1. Functional deficits which require 3+ hours per day of interdisciplinary therapy in a comprehensive inpatient rehab setting. Physiatrist is providing close team supervision and 24 hour management of active medical problems listed below. Physiatrist and rehab team continue to assess barriers to discharge/monitor patient progress toward functional and medical goals  Care Tool:  Bathing    Body parts bathed by patient: Left lower leg, Left arm, Chest, Abdomen, Front perineal area, Right upper leg, Left upper leg, Face   Body parts bathed by helper: Buttocks, Left arm, Right arm     Bathing assist Assist Level: Moderate Assistance - Patient 50 - 74%     Upper Body Dressing/Undressing Upper body dressing   What is the patient wearing?: Pull over shirt    Upper body assist Assist Level: Moderate Assistance - Patient 50 - 74%    Lower Body Dressing/Undressing Lower body dressing      What is the patient wearing?: Pants, Underwear/pull up     Lower body assist Assist for lower body dressing: Maximal Assistance - Patient 25 - 49%     Toileting Toileting    Toileting assist Assist for toileting: Maximal Assistance - Patient 25 - 49%  Transfers Chair/bed transfer  Transfers assist     Chair/bed transfer assist level: Moderate Assistance - Patient 50 - 74%     Locomotion Ambulation   Ambulation assist      Assist level: Maximal Assistance - Patient 25 - 49% Assistive device: Other (comment) (R hallway HR) Max distance: 15 ft   Walk 10 feet activity   Assist     Assist level: Maximal Assistance - Patient 25 - 49% Assistive device: Other (comment) (R hallway HR)   Walk 50 feet activity   Assist Walk 50 feet with 2 turns activity did not occur: Safety/medical concerns         Walk 150 feet activity   Assist Walk 150 feet activity did not occur: Safety/medical concerns         Walk 10 feet on uneven surface   activity   Assist Walk 10 feet on uneven surfaces activity did not occur: Safety/medical concerns         Wheelchair     Assist Is the patient using a wheelchair?: Yes Type of Wheelchair: Manual    Wheelchair assist level: Dependent - Patient 0% Max wheelchair distance: 250 ft    Wheelchair 50 feet with 2 turns activity    Assist        Assist Level: Dependent - Patient 0%   Wheelchair 150 feet activity     Assist      Assist Level: Dependent - Patient 0%   Blood pressure 114/83, pulse 94, temperature 98.2 F (36.8 C), resp. rate 16, height 5' 11 (1.803 m), weight 89.5 kg, SpO2 99%.  Medical Problem List and Plan: 1. Functional deficits secondary to debility following PEA arrest             -patient may  shower             -ELOS/Goals: 18-21d MinA  -Continue CIR  - Expected discharge 9/2  2.  Antithrombotics: -DVT/anticoagulation:  Pharmaceutical: Lovenox              -antiplatelet therapy: N/A due to IPH 3. Pain Management: Tylenol  prn Been on Lyrica  at home for diabetic neuropathic pain. 4. Mood/Behavior/Sleep: LCSW to follow for evaluation and support.              -antipsychotic agents: N/A 5. Neuropsych/cognition: This patient is not capable of making decisions on his own behalf without input from [facility].  - Patient was seen by neuropsychology, appreciate insight  -Dysphagia: Prior Use of cortrak before admission to CIR was evaluated on admission by SLP, mild facial and oral weakness, did ok with bedside swallow eval and continued on regular thin diet. Passed FEES 7/17 6. Skin/Wound Care: Routine pressure relief measures. Monitor incision for healing.    - 8/24: ECMO site scabbed over; stop aquacel. Informed nursing to keep dry given location  7. Fluids/Electrolytes/Nutrition: Strict I/O. Renal diet with CM restrictions --no fluid restriction needed 8. PEA arrest X 2/CMV viremia: on Valcyte  900 mg daily thru 10/26 with monitoring of levels  every 2 weeks post completion.  Pharmacy consult  9. Heart transplant: On Gengraf  BID with prednisone  daily             --continue Coreg , lower dose Lipitor  and Zetia .  Bactrim  until 8/13 Heart Failure team will follow -appreciate assistance -Called [place] [facility], cyclosporine  level added for Monday.  -[name] discussed [employer] was contacted about his pacemaker and working correctly.  - 8/18 cyclosporin level down to 274, appears  cardiology planning to recheck on tomorrow, this is scheduled  CMV levels positive but less than 200 viral load, cardiology continuing current regimen with valganciclovir   Cyclosporine  level still pending  Appreciate cardiology/heart failure team assistance.   - 8/22: Per cardiology note: CMV titer returned with positive, <200 viral load. Will continue current oral therapy with valganciclovir . Will make further adjustments to cyclosporine  based on his titer. Timing of administration reviewed, should be an accurate trough.   -Patient remained hemodynamically stable over the weekend, cardiology last saw 8-22.     -8/25 Increase losartan  to 100mg , will notify cardiology, cyclosporine  level was 107 on 8/21  -8/25 Reviewed cardiology noted, cylosporine dose has been adjusted to 125mg  Qam and 100mg  at bedtime, continue current dose losartan   -8/29 patient seen by heart failure team today, cyclosporine  level pending   Vitals:   12/11/23 2006 12/11/23 2114 12/12/23 0434 12/12/23 1406  BP: (!) 146/97  135/86 114/83  Pulse: 94  88 94  Resp: 16  16 16   Temp: 98.1 F (36.7 C)  98.3 F (36.8 C) 98.2 F (36.8 C)  TempSrc: Oral     SpO2: 100% 100% 95% 99%  Weight:   89.5 kg   Height:           10. Elevated phosphorous: Will keep on liberalized diet for food choices and to help with intake             --continue Phoslo  1334 tid ac--recheck level in am.  -8/14 Phos mildly elevated today, continue to monitor trend.  Continue PhosLo   Recheck  levels--normalized 8-16  11. Hx seizures: continue Keppra  and Trileptal .   12. 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  Monitor BS ac/hs. Continue Insulin  glargine with novolog  tid ac  CBG (last 3)  Recent Labs    12/12/23 0558 12/12/23 1037 12/12/23 1220  GLUCAP 92 67* 200*  Continue semglee  to 29U, diet changed to carb modified -8/19 last couple days CBGs a little lower in the morning and higher throughout the day, will decrease Semglee  to 26 units but increase mealtime insulin  from 5 units to 6 units. -8/21 increase aspart mealtime to 7 units 3 times daily for better control -8/22 better control overall, continue current regimen and monitor--controlled on current regimen 8-24: Low this a.m., reduce Semglee  from 26 to 20 units.  Meal coverage appears appropriate. 8/27 discussed with pharmacy, increase sliding scale to moderate 8/28 increase Semglee  to 22 units 8/29 discussed with pharmacy decrease Semglee  to 20 units and increase mealtime insulin  to 8 units.  Mild hypoglycemia later in the morning will decrease mealtime insulin  back to 7.  Suspect patient has inconsistent oral/carbohydrate intake.  Will consult diabetic coordinator as CBGs have been labile.  13. Anemia of chronic disease: Monitor H/H for stability.   -8/14 Hemoglobin stable at 9.6  -8/28 hemoglobin stable at 9.8  14. R hand numbness: Possible carpal tunnel  -continue carpal tunnel wrist splint use at night  - Consider outpatient EMG  -Voltaren  gel PRN- discussed with patient  - 8-23: Encouraged compliance with nighttime brace; can take 4 to 6 weeks to see benefit  15, Diastolic hypertension: 2grams IV mag ordered, coreg  increased to 25mg  BID -8/18 BP stable this morning Addendum BP appears elevated this evening, per chart review appears patient noted to be more lethargic and complaining of chest discomfort.  Appears PA has contacted cardiology and rapid response has been  called.  - 8/19 cardiology has adjusted  losartan  dose, possible increase again tomorrow.  Appreciate assistance  -8/20 cardiology increase losartan  to 50 mg, continue to monitor response  - 8/22, cardiology has increased losartan  dose to 75 mg, appreciate assistance--still slightly elevated, but will give a few days for adjustment.  8-24: Diastolic uptrending, otherwise stable.  Defer adjustment to cardiology in AM.  8/25 losartan  increased as above  8/28 fair BP control, continue to monitor    12/12/2023    2:06 PM 12/12/2023    4:34 AM 12/11/2023    8:06 PM  Vitals with BMI  Weight  197 lbs 5 oz   BMI  27.53   Systolic 114 135 853  Diastolic 83 86 97  Pulse 94 88 94     16.  Hypomagnesemia.  - 8/18 patient received IV mag over the weekend, started on mag oxide 400 mg daily by cardiology  - 8/19 IV magnesium  has been ordered by cardiology  -8/20 MG up to 1.6, continue supplementation  -8/22 magnesium  1.6, continue oral supplementation  8-24: Mag 1.5 last 2 days, but has stayed stable; increase supplement to 800 mg twice daily.  8/27-8 stable at 1.6, continue current supplementation  17 .  Mild AKI.  Baseline creatinine 0.8-0.9; was 1.14 last 8-22.  Will trend in AM.  Expect some adjustment with increase of losartan .  8-24: Creatinine back to baseline.   8/25 stable at creatinine 0.95 today  8/28 creatinine a little higher but overall stable at 1.0    LOS: 18 days A FACE TO FACE EVALUATION WAS PERFORMED  Jason Stevenson 12/12/2023, 3:53 PM

## 2023-12-12 NOTE — Progress Notes (Signed)
 Occupational Therapy Session Note  Patient Details  Name: Jason Stevenson MRN: [id number] Date of Birth: Dec 26, 1985  Today's Date: 12/12/2023 OT Individual Time: 8697-8640 OT Individual Time Calculation (min): 57 min    Short Term Goals: Week 3:  OT Short Term Goal 1 (Week 3): STG=LTG d/t ELOS  Skilled Therapeutic Interventions/Progress Updates:     Pt received sitting up in wc with wife present in room for family education focused session. Pt presenting to be in good spirits receptive to skilled OT session reporting 0/10 pain- OT offering intermittent rest breaks, repositioning, and therapeutic support to optimize participation in therapy session. Provided education on CVA etiology/recovery process, fall prevention, energy conservation techniques, and simple home modifications to increase Pt safety and accessibility. Education provided on positioning hemiplegic UE during ADLs/transfers, gait belt use, resting hand splint wear schedule, AAROM of R UE, and body mechanics during transfers to increase safety. Demonstrated and educated on hemi-techniques for U/LB dressing using reacher and dressing stick, discussed plans for Pt to complete sponge bath initially at d/c d/t only full bathroom available being upstairs. Pt's wife receptive to all education and motivated to learn. Engaged Pt's wife in hands on training assisting Pt with stand pivot transfers using hemi walker and toileting with Pt's wife demonstrating teach back as evidence of learning. Pt's wife demonstrating appropriate insight into Pt's deficits and able to provide necessary level of physical assistance. Wife reporting all questions were answered during session. Encouraged Pt's wife to return for additional family ed tomorrow.  Pt was left resting in wc with call bell in reach and all needs met. SLP entering room for session.    Therapy Documentation Precautions:  Precautions Precautions: Fall Precaution/Restrictions Comments: L  Hemiplegia Restrictions Weight Bearing Restrictions Per Provider Order: No   Therapy/Group: Individual Therapy  [name] 12/12/2023, 3:25 PM

## 2023-12-12 NOTE — Progress Notes (Signed)
 Physical Therapy Session Note  Patient Details  Name: Jason Stevenson MRN: [id number] Date of Birth: 1986/03/04  Today's Date: 12/12/2023 PT Individual Time: 1502-1628 PT Individual Time Calculation (min): 86 min   Short Term Goals: Week 2:  PT Short Term Goal 1 (Week 2): Pt will perform bed mobility with consistent ModA overall. PT Short Term Goal 1 - Progress (Week 2): Met PT Short Term Goal 2 (Week 2): Pt will perform sit<>stand transfers with no AD and modA. PT Short Term Goal 2 - Progress (Week 2): Met PT Short Term Goal 3 (Week 2): Pt will perform squat pivot transfers with MinA. PT Short Term Goal 3 - Progress (Week 2): Met PT Short Term Goal 4 (Week 2): Pt will perform stand pivot transfers with ModA +1. PT Short Term Goal 4 - Progress (Week 2): Met PT Short Term Goal 5 (Week 2): Pt will perform ambulation with MaxA +1 using RW for at least 25 ft. PT Short Term Goal 5 - Progress (Week 2): Met Week 3:  PT Short Term Goal 1 (Week 3): STG = LTG d/t ELOS  Skilled Therapeutic Interventions/Progress Updates:  Patient seated upright in w/c on entrance to room. Patient alert and agreeable to PT session. Wife present for family education.   Patient with no pain complaint at start of session.  Pt demos ability to propel w/c 100 ft from room to nurse's station with CGA/ MinA for improving coordination and clearance of LLE as pt forgets to reciprocate and can let LLE get too far under w/c to allow for easy raising to step forward. VC required throughout.   Demonstrated to wife how to provide CGA/ MinA to STS transfer to [facility] and how to relate appropriate vc. Pt questioned on sequencing of steps and is able to relate correctly. Performs once with PT then once with wife and vc provided to wife for allowing pt room to lean forward to initiate weight shift. Is able to perform appropriate cueing and provision of CGA for sit<>stand and stand pivot transfers. Assisted pt with car transfer and CGA/  MinA. Reminded pt not to grab out for car door as it will easily close on pt and/ or caregiver.   Demonstrated pt's progress in gait training and pt ambulating 50 feet using HW and slow LLE progression but significant improvement this week in ability perform with near CGA consistently. Related to wife not to ambulate with pt at home yet but to allow HHPT to continue to work with pt on improving quality of gait and ability to ambulate backwards.   Demonstrated ability to complete stair navigation with Min/ mod A to ascend with lift of LLE to step as pt unable, and then requiring assist in step clearance and guard to knee in order to descend.   Patient;s wife provides CGA to pt for return to bed from w/c at end of session. Wife cleared to assist pt in transfers in room. Pt then demos required vc and return to supine using sidelying technique with wife aware of education. Returns to supine with ModA for BLE.   Bed brakes locked, bed alarm set, and all needs within reach.   Therapy Documentation Precautions:  Precautions Precautions: Fall Precaution/Restrictions Comments: L Hemiplegia Restrictions Weight Bearing Restrictions Per Provider Order: No  Pain: No pain related by pt this session.    Therapy/Group: Individual Therapy  [name] PT, DPT, CSRS 12/12/2023, 6:40 PM

## 2023-12-13 DIAGNOSIS — Z941 Heart transplant status: Secondary | ICD-10-CM | POA: Diagnosis not present

## 2023-12-13 DIAGNOSIS — Z8679 Personal history of other diseases of the circulatory system: Secondary | ICD-10-CM | POA: Diagnosis not present

## 2023-12-13 DIAGNOSIS — R5381 Other malaise: Secondary | ICD-10-CM | POA: Diagnosis not present

## 2023-12-13 LAB — GLUCOSE, CAPILLARY
Glucose-Capillary: 98 mg/dL (ref 70–99)
Glucose-Capillary: 63 mg/dL — ABNORMAL LOW (ref 70–99)
Glucose-Capillary: 89 mg/dL (ref 70–99)
Glucose-Capillary: 212 mg/dL — ABNORMAL HIGH (ref 70–99)
Glucose-Capillary: 302 mg/dL — ABNORMAL HIGH (ref 70–99)
Glucose-Capillary: 247 mg/dL — ABNORMAL HIGH (ref 70–99)

## 2023-12-13 MED ORDER — INSULIN ASPART 100 UNIT/ML IJ SOLN
6.0000 [IU] | Freq: Three times a day (TID) | INTRAMUSCULAR | Status: DC
  Administered 2023-12-13 – 2023-12-16 (×8): 6 [IU] via SUBCUTANEOUS

## 2023-12-13 NOTE — Inpatient Diabetes Management (Addendum)
 Inpatient Diabetes Program Recommendations  AACE/ADA: New Consensus Statement on Inpatient Glycemic Control (2015)  Target Ranges:  Prepandial:   less than 140 mg/dL      Peak postprandial:   less than 180 mg/dL (1-2 hours)      Critically ill patients:  140 - 180 mg/dL    Latest Reference Range & Units 11/24/23 15:37  Hemoglobin A1C 4.8 - 5.6 % 7.2 (H)  (H): Data is abnormally high  Latest Reference Range & Units 12/12/23 05:58 12/12/23 10:37 12/12/23 12:20 12/12/23 16:37 12/12/23 21:06  Glucose-Capillary 70 - 99 mg/dL 92  2 units Novolog  @0717  67 (L) 200 (H)  11 units Novolog  @1402  202 (H)  12 units Novolog  @1804  153 (H)   20 units Lantus      Latest Reference Range & Units 12/13/23 06:54 12/13/23 11:46  Glucose-Capillary 70 - 99 mg/dL 98  7 units Novolog  63 (L)  (L): Data is abnormally low    Home DM Meds:  Lantus  24 units daily Humalog  14-16 units TID with meals   Current Orders:  Lantus  20 units at bedtime Novolog  7 units TID with meals Novolog  Moderate Correction Scale/ SSI (0-15 units) TID AC + HS   Note Hypoglycemia yest at 11am after getting 2 units Novolog  SSI (Correction)--Unsure why pt got the SSI as her CBG at 6am was 92 Note Lantus  dose reduced to 20 units last PM Novolog  Meal Coverage adjusted to 7 units TID this AM  MD- Note HYPO at 12pm today after getting 7 units Novolog  meal coverage.  Not sure if pt ate enough to get the meal coverage?  No PO documented for Breakfast.    Having labile CBGs  May consider reducing the Novolog  meal coverage to 5 units TID and make sure RNs only administering the meal coverage when pt eats 50% or greater of meal     --Will follow patient during hospitalization--  Adina Rudolpho Arrow RN, MSN, CDCES Diabetes Coordinator Inpatient Glycemic Control Team Team Pager: [phone] (8a-5p)

## 2023-12-13 NOTE — Progress Notes (Signed)
 Physical Therapy Session Note  Patient Details  Name: Jason Stevenson MRN: [id number] Date of Birth: 08-14-1985  Today's Date: 12/13/2023 PT Individual Time: 8692-8651 PT Individual Time Calculation (min): 41 min   Short Term Goals: Week 3:  PT Short Term Goal 1 (Week 3): STG = LTG d/t ELOS  Skilled Therapeutic Interventions/Progress Updates: Patient sitting on toilet in bathroom on entrance to room. Patient alert and agreeable to PT session.   Patient reported no pain.   Therapeutic Activity: Transfers: Pt performed sit<>stand transfers throughout session with R HW and with CGA/light minA. Provided VC for anterior scoot/increasing forward lean, and checking set up of B LE placement.  - Pt totalA for posterior pericare standing in STEDY. Pt transported to [facility] with VC to control descent back to back of WC. Pt maxA to donn personal underwear and hospital pants for time management.   Gait Training:  Pt ambulated 24' using HW with CGA/minA and WC follow. Pt demonstrated the following gait deviations with therapist providing the described cuing and facilitation for improvement:  - Decreased BOS - Decreased step clearance/length B LE's with VC to shift weight accordingly to improve (manual facilitation required). - Pt presented with ataxic step pattern L LE (brief moment longer maintained in hip flexion to coordinate step to avoid narrow BOS) Pt required seated rest break and then participated in retro steps with HW in R UE. Pt presented with decreased L knee flexion. Pt required cues to shift weight to R and to bring L heel back. Pt able to do so up to meet R LE, then pt with decreased ability to maintain knee flexion and to advance backwards without having to hike up L hip. Pt required seated rest break.  Patient sitting in [facility] with OT hand off in day room gym at end of session with brakes locked.      Therapy Documentation Precautions:  Precautions Precautions:  Fall Precaution/Restrictions Comments: L Hemiplegia Restrictions Weight Bearing Restrictions Per Provider Order: No  Therapy/Group: Individual Therapy  Jodi Criscuolo PTA 12/13/2023, 2:48 PM

## 2023-12-13 NOTE — Progress Notes (Signed)
 Speech Language Pathology Daily Session Note  Patient Details  Name: Bailee Thall MRN: [id number] Date of Birth: June 06, 1985  Today's Date: 12/13/2023 SLP Individual Time: 1445-1530 SLP Individual Time Calculation (min): 45 min  Short Term Goals: Week 3: SLP Short Term Goal 1 (Week 3): STG = LTG due to ELOS  Skilled Therapeutic Interventions: Pt greeted in his room. He was up in his WC upon SLP arrival and very pleasant/cooperative throughout tx tasks targeting speech production and cognition. Family education was scheduled as well, though no family member was present. He independently completed EMST via EMST150: 25 reps @ 30 cmH2O. Also reviewed WRAP memory strategies. He recalled 3/4 independently and success improved to 4/4 w/ only supervisionA. In verbal problem solving task re upcoming d/c, he was able to ID progress thus far and remaining speech production/cognitive goals w/ supervisionA. Throughout tx tasks, he was 98% intelligible independently. At the end of tx tasks, he was left in his [facility] w/ the alarm set and call light within reach. Recommend cont ST per POC.   Pain Pain Assessment Pain Scale: 0-10 Pain Score: 0-No pain  Therapy/Group: Individual Therapy  Recardo DELENA Mole 12/13/2023, 3:09 PM

## 2023-12-13 NOTE — Progress Notes (Signed)
 PROGRESS NOTE  Pt aware of d/c day and date Subjective/Complaints: Appreciate cardiology note 8/29   Review of systems: Chest chills, HA, abdominal pain, nausea, vomiting, constipation, diarrhea, joint pain  + R hand tingling/numbness-continued    Objective:   No results found.  Recent Labs    12/11/23 0455  WBC 5.0  HGB 9.8*  HCT 29.7*  PLT 230     Recent Labs    12/11/23 0455  NA 141  K 4.0  CL 106  CO2 26  GLUCOSE 206*  BUN 17  CREATININE 1.00  CALCIUM  9.2    Intake/Output Summary (Last 24 hours) at 12/13/2023 1257 Last data filed at 12/12/2023 2000 Gross per 24 hour  Intake --  Output 250 ml  Net -250 ml        Physical Exam: Vital Signs Blood pressure (!) 130/92, pulse 85, temperature 97.7 F (36.5 C), resp. rate 18, height 5' 11 (1.803 m), weight 88.3 kg, SpO2 100%.  General: No acute distress Mood and affect are appropriate Heart: Regular rate and rhythm no rubs murmurs or extra sounds Lungs: Clear to auscultation, breathing unlabored, no rales or wheezes Abdomen: Positive bowel sounds, soft nontender to palpation, nondistended Extremities: No clubbing, cyanosis, or edema Skin: No evidence of breakdown, no evidence of rash    Neurologic: Awake and alert, cranial nerves II through XII grossly intact,, follows commands  Sensory exam normal sensation to light touch is reduced on the left side below the knee as well as in the left upper extremity  Altered sensation palmar 1st and 2nd digit right hand--unchanged Motor weakness LUE>LLE Musculoskeletal: No joint swelling , No hand tenderness today, no chest wall soreness noted Wearing brace LUE 4/5 right upper and lower extremities strength Left upper extremity 1-2 out of 5 Left lower extremity hip flexion 3 out of 5, knee extension 4-/5, ankle plantarflexion 2 out of 5, ankle dorsiflexion 0-1 out of 5 Physical exam unchanged from the  above on reexamination 12/13/23     Assessment/Plan: 1. Functional deficits which require 3+ hours per day of interdisciplinary therapy in a comprehensive inpatient rehab setting. Physiatrist is providing close team supervision and 24 hour management of active medical problems listed below. Physiatrist and rehab team continue to assess barriers to discharge/monitor patient progress toward functional and medical goals  Care Tool:  Bathing    Body parts bathed by patient: Left lower leg, Left arm, Chest, Abdomen, Front perineal area, Right upper leg, Left upper leg, Face   Body parts bathed by helper: Buttocks, Left arm, Right arm     Bathing assist Assist Level: Moderate Assistance - Patient 50 - 74%     Upper Body Dressing/Undressing Upper body dressing   What is the patient wearing?: Pull over shirt    Upper body assist Assist Level: Moderate Assistance - Patient 50 - 74%    Lower Body Dressing/Undressing Lower body dressing      What is the patient wearing?: Pants, Underwear/pull up     Lower body assist Assist for lower body dressing: Maximal Assistance - Patient 25 - 49%     Toileting Toileting    Toileting assist Assist for toileting: Maximal  Assistance - Patient 25 - 49%     Transfers Chair/bed transfer  Transfers assist     Chair/bed transfer assist level: Moderate Assistance - Patient 50 - 74%     Locomotion Ambulation   Ambulation assist      Assist level: Maximal Assistance - Patient 25 - 49% Assistive device: Other (comment) (R hallway HR) Max distance: 15 ft   Walk 10 feet activity   Assist     Assist level: Maximal Assistance - Patient 25 - 49% Assistive device: Other (comment) (R hallway HR)   Walk 50 feet activity   Assist Walk 50 feet with 2 turns activity did not occur: Safety/medical concerns         Walk 150 feet activity   Assist Walk 150 feet activity did not occur: Safety/medical concerns         Walk 10  feet on uneven surface  activity   Assist Walk 10 feet on uneven surfaces activity did not occur: Safety/medical concerns         Wheelchair     Assist Is the patient using a wheelchair?: Yes Type of Wheelchair: Manual    Wheelchair assist level: Dependent - Patient 0% Max wheelchair distance: 250 ft    Wheelchair 50 feet with 2 turns activity    Assist        Assist Level: Dependent - Patient 0%   Wheelchair 150 feet activity     Assist      Assist Level: Dependent - Patient 0%   Blood pressure (!) 130/92, pulse 85, temperature 97.7 F (36.5 C), resp. rate 18, height 5' 11 (1.803 m), weight 88.3 kg, SpO2 100%.  Medical Problem List and Plan: 1. Functional deficits secondary to debility following PEA arrest             -patient may  shower             -ELOS/Goals: 18-21d MinA  -Continue CIR  - Expected discharge 9/2  2.  Antithrombotics: -DVT/anticoagulation:  Pharmaceutical: Lovenox              -antiplatelet therapy: N/A due to IPH 3. Pain Management: Tylenol  prn Been on Lyrica  at home for diabetic neuropathic pain. 4. Mood/Behavior/Sleep: LCSW to follow for evaluation and support.              -antipsychotic agents: N/A 5. Neuropsych/cognition: This patient is not capable of making decisions on his own behalf without input from [facility]  - Patient was seen by neuropsychology, appreciate insight  -Dysphagia: Prior Use of cortrak before admission to CIR was evaluated on admission by SLP, mild facial and oral weakness, did ok with bedside swallow eval and continued on regular thin diet. Passed FEES 7/17 6. Skin/Wound Care: Routine pressure relief measures. Monitor incision for healing.    - 8/24: ECMO site scabbed over; stop aquacel. Informed nursing to keep dry given location  7. Fluids/Electrolytes/Nutrition: Strict I/O. Renal diet with CM restrictions --no fluid restriction needed 8. PEA arrest X 2/CMV viremia: on Valcyte  900 mg daily thru 10/26  with monitoring of levels every 2 weeks post completion.  Pharmacy consult  9. Heart transplant: On Gengraf  BID with prednisone  daily             --continue Coreg , lower dose Lipitor  and Zetia .  Bactrim  until 8/13 Heart Failure team will follow -appreciate assistance -Called [place] [facility] heart transplant, cyclosporine  level added for Monday.  -[name] discussed [employer] was contacted about his pacemaker and  working correctly.  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   - 8/22: Per cardiology note: CMV titer returned with positive, <200 viral load. Will continue current oral therapy with valganciclovir . Will make further adjustments to cyclosporine  based on his titer. Timing of administration reviewed, should be an accurate trough.   -Patient remained hemodynamically stable over the weekend, cardiology last saw 8-22.     -8/25 Increase losartan  to 100mg , will notify cardiology, cyclosporine  level was 107 on 8/21  -8/25 Reviewed cardiology noted, cylosporine dose has been adjusted to 125mg  Qam and 100mg  at bedtime, continue current dose losartan   -8/29 patient seen by heart failure team today, cyclosporine  level pending   Vitals:   12/12/23 1406 12/12/23 1959 12/13/23 0500 12/13/23 0556  BP: 114/83 (!) 150/97  (!) 130/92  Pulse: 94 (!) 101  85  Resp: 16 18  18   Temp: 98.2 F (36.8 C) 98.1 F (36.7 C)  97.7 F (36.5 C)  TempSrc:      SpO2: 99% 100%  100%  Weight:   88.3 kg   Height:           10. Elevated phosphorous: Will keep on liberalized diet for food choices and to help with intake             --continue Phoslo  1334 tid ac--recheck level in am.  -8/14 Phos mildly elevated today, continue to monitor trend.   Continue PhosLo   Recheck levels--normalized 8-16  11. Hx seizures: continue Keppra  and Trileptal .   12. 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  Monitor BS ac/hs. Continue Insulin  glargine with novolog  tid ac  CBG (last 3)  Recent Labs    12/13/23 0654 12/13/23 1146 12/13/23 1212  GLUCAP 98 63* 89     Reduce mealtime coverage to 6U  13. Anemia of chronic disease: Monitor H/H for stability.   -8/14 Hemoglobin stable at 9.6  -8/28 hemoglobin stable at 9.8  14. R hand numbness: Possible carpal tunnel  -continue carpal tunnel wrist splint use at night  - Consider outpatient EMG  -Voltaren  gel PRN- discussed with patient  - 8-23: Encouraged compliance with nighttime brace; can take 4 to 6 weeks to see benefit  15, Diastolic hypertension: 2grams IV mag ordered, coreg  increased to 25mg  BID -8/18 BP stable this morning Addendum BP appears elevated this evening, per chart review appears patient noted to be more lethargic and complaining of chest discomfort.  Appears PA has contacted cardiology and rapid response has been called.  - 8/19 cardiology has adjusted losartan  dose, possible increase again tomorrow.  Appreciate assistance  -8/20 cardiology increase losartan  to 50 mg, continue to monitor response  - 8/22, cardiology has increased losartan  dose to 75 mg, appreciate assistance--still slightly elevated, but will give a few days for adjustment.  8-24: Diastolic uptrending, otherwise stable.  Defer adjustment to cardiology in AM.  8/25 losartan  increased as above  8/28 fair BP control, continue to monitor    12/13/2023    5:56 AM 12/13/2023    5:00 AM 12/12/2023    7:59 PM  Vitals with BMI  Weight  194 lbs 11 oz   BMI  27.16   Systolic 130  150  Diastolic 92  97  Pulse 85  101  BP meds Per cardiology   16.  Hypomagnesemia.  - 8/18 patient received IV mag over the weekend, started on mag oxide 400 mg daily by cardiology  - 8/19 IV  magnesium  has been ordered by cardiology  -8/20 MG up to 1.6, continue supplementation  -8/22 magnesium  1.6, continue oral supplementation  8-24: Mag 1.5 last 2 days, but has stayed stable; increase supplement to 800 mg twice daily.  8/27-8 stable at 1.6, continue current supplementation  17 .  Mild AKI.  Baseline creatinine 0.8-0.9; was 1.14 last 8-22.  Will trend in AM.  Expect some adjustment with increase of losartan .  8-24: Creatinine back to baseline.   8/25 stable at creatinine 0.95 today  8/28 creatinine a little higher but overall stable at 1.0    LOS: 19 days A FACE TO FACE EVALUATION WAS PERFORMED  Prentice FORBES Compton 12/13/2023, 12:57 PM

## 2023-12-13 NOTE — Progress Notes (Signed)
 Occupational Therapy Session Note  Patient Details  Name: Jason Stevenson MRN: [id number] Date of Birth: [birth year]  Today's Date: 12/13/2023 OT Individual Time: 8651-8568 OT Individual Time Calculation (min): 43 min    Short Term Goals: Week 3:  OT Short Term Goal 1 (Week 3): STG=LTG d/t ELOS  Skilled Therapeutic Interventions/Progress Updates:  Patient agreeable to participate in OT session. Reports no pain level.   Patient participated in skilled OT session focusing on transfer training, UE NMR. Patient completed UE activity with towel slides on table. Patient able to complete stand pivot transfer with hemi walker with OT and with family (brother) to increase safety at home. Completed swuat pivot transfer with caregiver to increase balance. Patient able to complete all tasks with good activity tolerance. Returned to room all needs in reach.   Therapy Documentation Precautions:  Precautions Precautions: Fall Precaution/Restrictions Comments: L Hemiplegia Restrictions Weight Bearing Restrictions Per Provider Order: No Therapy/Group: Individual Therapy  Jason Stevenson 12/13/2023, 6:56 AM

## 2023-12-14 DIAGNOSIS — R5381 Other malaise: Secondary | ICD-10-CM | POA: Diagnosis not present

## 2023-12-14 DIAGNOSIS — Z941 Heart transplant status: Secondary | ICD-10-CM | POA: Diagnosis not present

## 2023-12-14 DIAGNOSIS — Z8679 Personal history of other diseases of the circulatory system: Secondary | ICD-10-CM | POA: Diagnosis not present

## 2023-12-14 LAB — GLUCOSE, CAPILLARY
Glucose-Capillary: 176 mg/dL — ABNORMAL HIGH (ref 70–99)
Glucose-Capillary: 151 mg/dL — ABNORMAL HIGH (ref 70–99)
Glucose-Capillary: 159 mg/dL — ABNORMAL HIGH (ref 70–99)
Glucose-Capillary: 180 mg/dL — ABNORMAL HIGH (ref 70–99)

## 2023-12-14 LAB — CYCLOSPORINE: Cyclosporine, LabCorp: 104 ng/mL (ref 100–400)

## 2023-12-14 MED ORDER — BUDESONIDE 0.25 MG/2ML IN SUSP
0.2500 mg | Freq: Two times a day (BID) | RESPIRATORY_TRACT | Status: DC
  Administered 2023-12-14: 0.25 mg via RESPIRATORY_TRACT
  Filled 2023-12-14 (×2): qty 2

## 2023-12-14 MED ORDER — CYCLOSPORINE MODIFIED (GENGRAF) 25 MG PO CAPS
125.0000 mg | ORAL_CAPSULE | Freq: Every evening | ORAL | Status: DC
  Administered 2023-12-14 – 2023-12-15 (×2): 125 mg via ORAL
  Filled 2023-12-14 (×3): qty 5

## 2023-12-14 NOTE — Progress Notes (Signed)
 PROGRESS NOTE Discussed lack of CBGs with pt and LPN, some nurses relying on Freestyle libre readings  Review of systems: Chest chills, HA, abdominal pain, nausea, vomiting, constipation, diarrhea, joint pain  + R hand tingling/numbness-continued    Objective:   No results found.  No results for input(s): WBC, HGB, HCT, PLT in the last 72 hours.    No results for input(s): NA, K, CL, CO2, GLUCOSE, BUN, CREATININE, CALCIUM  in the last 72 hours.   Intake/Output Summary (Last 24 hours) at 12/14/2023 1122 Last data filed at 12/13/2023 2200 Gross per 24 hour  Intake 200 ml  Output 675 ml  Net -475 ml        Physical Exam: Vital Signs Blood pressure 132/78, pulse 88, temperature 97.9 F (36.6 C), resp. rate 18, height 5' 11 (1.803 m), weight 87.7 kg, SpO2 100%.  General: No acute distress Mood and affect are appropriate Heart: Regular rate and rhythm no rubs murmurs or extra sounds Lungs: Clear to auscultation, breathing unlabored, no rales or wheezes Abdomen: Positive bowel sounds, soft nontender to palpation, nondistended Extremities: No clubbing, cyanosis, or edema Skin: No evidence of breakdown, no evidence of rash    Neurologic: Awake and alert, cranial nerves II through XII grossly intact,, follows commands  Sensory exam normal sensation to light touch is reduced on the left side below the knee as well as in the left upper extremity  Altered sensation palmar 1st and 2nd digit right hand--unchanged  Musculoskeletal: No joint swelling , No hand tenderness today, no chest wall soreness noted Wearing brace LUE 4/5 right upper and lower extremities strength Left upper extremity 2- out of 5 Delt, bi tri finger flexors and extensors Left lower extremity hip flexion 3 out of 5, knee extension 4-/5, ankle plantarflexion 2 out of 5, ankle dorsiflexion 0-1 out of 5 Physical exam unchanged from  the above on reexamination 12/14/23     Assessment/Plan: 1. Functional deficits which require 3+ hours per day of interdisciplinary therapy in a comprehensive inpatient rehab setting. Physiatrist is providing close team supervision and 24 hour management of active medical problems listed below. Physiatrist and rehab team continue to assess barriers to discharge/monitor patient progress toward functional and medical goals  Care Tool:  Bathing    Body parts bathed by patient: Left lower leg, Left arm, Chest, Abdomen, Front perineal area, Right upper leg, Left upper leg, Face   Body parts bathed by helper: Buttocks, Left arm, Right arm     Bathing assist Assist Level: Moderate Assistance - Patient 50 - 74%     Upper Body Dressing/Undressing Upper body dressing   What is the patient wearing?: Pull over shirt    Upper body assist Assist Level: Moderate Assistance - Patient 50 - 74%    Lower Body Dressing/Undressing Lower body dressing      What is the patient wearing?: Pants, Underwear/pull up     Lower body assist Assist for lower body dressing: Maximal Assistance - Patient 25 - 49%     Toileting Toileting    Toileting assist Assist for toileting: Maximal Assistance - Patient 25 - 49%     Transfers Chair/bed transfer  Transfers assist  Chair/bed transfer assist level: Moderate Assistance - Patient 50 - 74%     Locomotion Ambulation   Ambulation assist      Assist level: Maximal Assistance - Patient 25 - 49% Assistive device: Other (comment) (R hallway HR) Max distance: 15 ft   Walk 10 feet activity   Assist     Assist level: Maximal Assistance - Patient 25 - 49% Assistive device: Other (comment) (R hallway HR)   Walk 50 feet activity   Assist Walk 50 feet with 2 turns activity did not occur: Safety/medical concerns         Walk 150 feet activity   Assist Walk 150 feet activity did not occur: Safety/medical concerns         Walk  10 feet on uneven surface  activity   Assist Walk 10 feet on uneven surfaces activity did not occur: Safety/medical concerns         Wheelchair     Assist Is the patient using a wheelchair?: Yes Type of Wheelchair: Manual    Wheelchair assist level: Dependent - Patient 0% Max wheelchair distance: 250 ft    Wheelchair 50 feet with 2 turns activity    Assist        Assist Level: Dependent - Patient 0%   Wheelchair 150 feet activity     Assist      Assist Level: Dependent - Patient 0%   Blood pressure 132/78, pulse 88, temperature 97.9 F (36.6 C), resp. rate 18, height 5' 11 (1.803 m), weight 87.7 kg, SpO2 100%.  Medical Problem List and Plan: 1. Functional deficits secondary to debility following PEA arrest             -patient may  shower             -ELOS/Goals: 18-21d MinA  -Continue CIR  - Expected discharge 9/2  2.  Antithrombotics: -DVT/anticoagulation:  Pharmaceutical: Lovenox              -antiplatelet therapy: N/A due to IPH 3. Pain Management: Tylenol  prn Been on Lyrica  at home for diabetic neuropathic pain. 4. Mood/Behavior/Sleep: LCSW to follow for evaluation and support.              -antipsychotic agents: N/A 5. Neuropsych/cognition: This patient is not capable of making decisions on his own behalf without input from [facility].  - Patient was seen by neuropsychology, appreciate insight  -Dysphagia: Prior Use of cortrak before admission to CIR was evaluated on admission by SLP, mild facial and oral weakness, did ok with bedside swallow eval and continued on regular thin diet. Passed FEES 7/17 6. Skin/Wound Care: Routine pressure relief measures. Monitor incision for healing.    - 8/24: ECMO site scabbed over; stop aquacel. Informed nursing to keep dry given location  7. Fluids/Electrolytes/Nutrition: Strict I/O. Renal diet with CM restrictions --no fluid restriction needed 8. PEA arrest X 2/CMV viremia: on Valcyte  900 mg daily thru 10/26  with monitoring of levels every 2 weeks post completion.  Pharmacy consult  9. Heart transplant: On Gengraf  BID with prednisone  daily             --continue Coreg , lower dose Lipitor  and Zetia .  Bactrim  until 8/13 Heart Failure team will follow -appreciate assistance -Called [facility] [place] [facility] heart transplant, cyclosporine  level added for Monday.  -[name] discussed [employer] was contacted about his pacemaker and working correctly.  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   - 8/22: Per cardiology note: CMV titer returned with positive, <200 viral load. Will continue current oral therapy with valganciclovir . Will make further adjustments to cyclosporine  based on his titer. Timing of administration reviewed, should be an accurate trough.   -Patient remained hemodynamically stable over the weekend, cardiology last saw 8-22.     -8/25 Increase losartan  to 100mg , will notify cardiology, cyclosporine  level was 107 on 8/21  -8/25 Reviewed cardiology noted, cylosporine dose has been adjusted to 125mg  Qam and 100mg  at bedtime, continue current dose losartan   -8/29 patient seen by heart failure team today, cyclosporine  level pending   Vitals:   12/13/23 2006 12/14/23 0500 12/14/23 0551 12/14/23 0800  BP: 132/87  (!) 127/93 132/78  Pulse: (!) 102  88   Resp: 18  18   Temp: 98.2 F (36.8 C)  97.9 F (36.6 C)   TempSrc:      SpO2: 99%  100%   Weight:  87.7 kg    Height:           10. Elevated phosphorous: Will keep on liberalized diet for food choices and to help with intake             --continue Phoslo  1334 tid ac--recheck level in am.  -8/14 Phos mildly elevated today, continue to monitor trend.  Continue PhosLo   Recheck  levels--normalized 8-16  11. Hx seizures: continue Keppra  and Trileptal .   12. 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  Monitor BS ac/hs. Continue Insulin  glargine with novolog  tid ac  CBG (last 3)  Recent Labs    12/13/23 1437 12/13/23 1646 12/13/23 2148  GLUCAP 212* 302* 247*    Has Freestyle libre which needs to be transcribed into EPIC- while I was in room today , alarm sounded and was 170, Glucometer reading 10 min  later 159 - cont current meds 8/31 but have asked nsg to record additional CBGs from today 8/31 Labile CBGs  appears to be intake related 13. Anemia of chronic disease: Monitor H/H for stability.   -8/14 Hemoglobin stable at 9.6  -8/28 hemoglobin stable at 9.8  14. R hand numbness: Possible carpal tunnel  -continue carpal tunnel wrist splint use at night  - Consider outpatient EMG  -Voltaren  gel PRN- discussed with patient  - 8-23: Encouraged compliance with nighttime brace; can take 4 to 6 weeks to see benefit  15, Diastolic hypertension: 2grams IV mag ordered, coreg  increased to 25mg  BID -8/18 BP stable this morning Addendum BP appears elevated this evening, per chart review appears patient noted to be more lethargic and complaining of chest discomfort.  Appears PA has contacted cardiology and rapid response has been called.  - 8/19 cardiology has adjusted losartan  dose, possible increase again tomorrow.  Appreciate assistance  -8/20 cardiology increase losartan  to 50 mg, continue to monitor response  - 8/22, cardiology has increased losartan  dose to 75 mg, appreciate assistance--still slightly elevated, but will give a few days for adjustment.  8-24: Diastolic uptrending, otherwise stable.  Defer adjustment to cardiology in AM.  8/25 losartan  increased as above  8/28 fair BP control, continue to monitor    12/14/2023    8:00 AM 12/14/2023    5:51 AM 12/14/2023    5:00 AM  Vitals with BMI  Weight   193 lbs 5  oz  BMI    26.98  Systolic 132 127   Diastolic 78 93   Pulse  88   BP meds Per cardiology   16.  Hypomagnesemia.  - 8/18 patient received IV mag over the weekend, started on mag oxide 400 mg daily by cardiology  - 8/19 IV magnesium  has been ordered by cardiology  -8/20 MG up to 1.6, continue supplementation  -8/22 magnesium  1.6, continue oral supplementation  8-24: Mag 1.5 last 2 days, but has stayed stable; increase supplement to 800 mg twice daily.  8/27-8 stable at 1.6, continue current supplementation Recheck 9/1  17 .  Mild AKI.  Resolved, back to baseline    LOS: 20 days A FACE TO FACE EVALUATION WAS PERFORMED  Prentice FORBES Compton 12/14/2023, 11:22 AM

## 2023-12-14 NOTE — Progress Notes (Shared)
 Physical Therapy Discharge Summary  Patient Details  Name: Jason Stevenson MRN: [id number] Date of Birth: [birth year]  Date of Discharge from PT service:December 16, 2023  {CHL IP REHAB PT TIME CALCULATION:304800500}   Patient has met {NUMBERS 0-12:18577} of {NUMBERS 0-12:18577} long term goals due to improved activity tolerance, improved balance, improved postural control, increased strength, functional use of  left lower extremity, improved attention, improved awareness, and improved coordination.  Patient to discharge at a wheelchair level supervision,/CGA.   Patient's care partner is independent to provide the necessary physical and cognitive assistance at discharge.  Reasons goals not met: ***  Recommendation:  Patient will benefit from ongoing skilled PT services in {setting:3041680} to continue to advance safe functional mobility, address ongoing impairments in strength, coordination, balance, activity tolerance, cognition, safety awareness, and to minimize fall risk.  Equipment: Hemiwalker, custom [id number] wheelchair  Reasons for discharge: treatment goals met  Patient/family agrees with progress made and goals achieved: Yes  PT Discharge Precautions/Restrictions Precautions Precautions: Fall Precaution/Restrictions Comments: L Hemiplegia Restrictions Weight Bearing Restrictions Per Provider Order: No Vital Signs Therapy Vitals Temp: 97.8 F (36.6 C) Pulse Rate: (!) 106 Resp: 18 BP: 122/83 Patient Position (if appropriate): Lying Oxygen Therapy SpO2: 100 % O2 Device: Room Air Pain ***   Pain Interference***   Vision/Perception  Vision - History Ability to See in Adequate Light: 0 Adequate Vision - Assessment Eye Alignment: Within Functional Limits Ocular Range of Motion: Within Functional Limits Tracking/Visual Pursuits: Able to track stimulus in all quads without difficulty Saccades: Decreased speed of saccadic movement Convergence: Within functional  limits Perception Perception: Impaired Preception Impairment Details: Inattention/Neglect Perception-Other Comments: improving L inattention Praxis Praxis: Impaired Praxis Impairment Details: Motor planning;Initiation Praxis-Other Comments: slow to plan and initiate but improving from eval  Cognition Overall Cognitive Status: Impaired/Different from baseline Arousal/Alertness: Awake/alert Orientation Level: Oriented X4 Attention: Sustained;Focused Focused Attention: Impaired Sustained Attention: Impaired Memory: Impaired Awareness: Appears intact Problem Solving: Impaired Executive Function: Self Correcting;Initiating Initiating: Impaired Self Correcting: Impaired Safety/Judgment: Impaired Sensation Sensation Light Touch: Impaired by gross assessment Proprioception Impaired Details: Impaired LUE;Impaired LLE Coordination Coordination and Movement Description: coordination deficit d/t L hemiplegia Heel Shin Test: initiates with LLE; slow to initiate and perform touch to opposite shin with RLE Motor  Motor Motor: Hemiplegia;Motor impersistence Motor - Discharge Observations: LUE hemiplegia, LLE hemipareisis  Mobility Bed Mobility Bed Mobility: Rolling Right;Rolling Left;Supine to Sit Rolling Right: Supervision/verbal cueing Rolling Left: Supervision/Verbal cueing Supine to Sit: Contact Guard/Touching assist;Supervision/Verbal cueing Transfers Transfers: Sit to Stand;Stand to Sit;Stand Pivot Transfers Sit to Stand: Contact Guard/Touching assist Stand to Sit: Contact Guard/Touching assist Stand Pivot Transfers: Minimal Assistance - Patient > 75% Stand Pivot Transfer Details: Verbal cues for technique;Verbal cues for sequencing;Verbal cues for precautions/safety;Manual facilitation for weight shifting Transfer (Assistive device): Hemi-walker Locomotion  Gait Ambulation: Yes Gait Assistance: Minimal Assistance - Patient > 75% Gait Distance (Feet): 75 Feet Assistive  device: Hemi-walker Gait Assistance Details: Verbal cues for sequencing;Verbal cues for technique;Verbal cues for precautions/safety;Verbal cues for gait pattern;Verbal cues for safe use of DME/AE Gait Gait: Yes Gait Pattern: Impaired Gait Pattern: Step-to pattern;Decreased step length - right;Decreased step length - left;Decreased weight shift to left;Decreased dorsiflexion - left;Left flexed knee in stance;Narrow base of support;Poor foot clearance - left Gait velocity: significantly reduced Stairs / Additional Locomotion Stairs: Yes Stairs Assistance: Moderate Assistance - Patient 50 - 74% Stair Management Technique: One rail Left;Step to pattern;Forwards Number of Stairs: 4 Height of Stairs: 6 Ramp: Contact Guard/touching assist (w/c) Wheelchair Mobility  Wheelchair Mobility: Yes Wheelchair Assistance: Minimal assistance - Patient >75% Wheelchair Propulsion: Both lower extermities;Left lower extremity Wheelchair Parts Management: Needs assistance;Supervision/cueing Distance: 100 ft  Trunk/Postural Assessment  Cervical Assessment Cervical Assessment: Exceptions to [facility] (mild forward head) Thoracic Assessment Thoracic Assessment: Exceptions to [facility] (rounded shoudlers) Lumbar Assessment Lumbar Assessment: Within Functional Limits Postural Control Postural Control: Deficits on evaluation Trunk Control: decreased Righting Reactions: decreased  Balance Balance Balance Assessed: Yes Static Sitting Balance Static Sitting - Balance Support: Feet supported Static Sitting - Level of Assistance: 6: Modified independent (Device/Increase time);5: Stand by assistance Dynamic Sitting Balance Dynamic Sitting - Balance Support: Feet supported;During functional activity Dynamic Sitting - Level of Assistance: 5: Stand by assistance Static Standing Balance Static Standing - Balance Support: During functional activity;Left upper extremity supported Static Standing - Level of Assistance: 4: Min  assist;5: Stand by assistance Dynamic Standing Balance Dynamic Standing - Balance Support: During functional activity;Right upper extremity supported Dynamic Standing - Level of Assistance: 4: Min assist Extremity Assessment ***           [name] PT, DPT, CSRS 12/14/2023, 11:45 PM

## 2023-12-15 DIAGNOSIS — R261 Paralytic gait: Secondary | ICD-10-CM | POA: Diagnosis not present

## 2023-12-15 DIAGNOSIS — I69198 Other sequelae of nontraumatic intracerebral hemorrhage: Secondary | ICD-10-CM | POA: Diagnosis not present

## 2023-12-15 DIAGNOSIS — R5381 Other malaise: Secondary | ICD-10-CM | POA: Diagnosis not present

## 2023-12-15 LAB — BASIC METABOLIC PANEL WITH GFR
Anion gap: 10 (ref 5–15)
BUN: 23 mg/dL — ABNORMAL HIGH (ref 6–20)
CO2: 26 mmol/L (ref 22–32)
Calcium: 8.9 mg/dL (ref 8.9–10.3)
Chloride: 102 mmol/L (ref 98–111)
Creatinine, Ser: 1.19 mg/dL (ref 0.61–1.24)
GFR, Estimated: >60 mL/min (ref >60)
Glucose, Bld: 322 mg/dL — ABNORMAL HIGH (ref 70–99)
Potassium: 4.4 mmol/L (ref 3.5–5.1)
Sodium: 138 mmol/L (ref 135–145)

## 2023-12-15 LAB — CBC
HCT: 30.9 % — ABNORMAL LOW (ref 39.0–52.0)
Hemoglobin: 9.9 g/dL — ABNORMAL LOW (ref 13.0–17.0)
MCH: 28.4 pg (ref 26.0–34.0)
MCHC: 32 g/dL (ref 30.0–36.0)
MCV: 88.5 fL (ref 80.0–100.0)
Platelets: 224 K/uL (ref 150–400)
RBC: 3.49 MIL/uL — ABNORMAL LOW (ref 4.22–5.81)
RDW: 15.3 % (ref 11.5–15.5)
WBC: 4.9 K/uL (ref 4.0–10.5)
nRBC: 0 % (ref 0.0–0.2)

## 2023-12-15 LAB — GLUCOSE, CAPILLARY
Glucose-Capillary: 300 mg/dL — ABNORMAL HIGH (ref 70–99)
Glucose-Capillary: 286 mg/dL — ABNORMAL HIGH (ref 70–99)
Glucose-Capillary: 116 mg/dL — ABNORMAL HIGH (ref 70–99)
Glucose-Capillary: 137 mg/dL — ABNORMAL HIGH (ref 70–99)
Glucose-Capillary: 174 mg/dL — ABNORMAL HIGH (ref 70–99)

## 2023-12-15 LAB — MAGNESIUM: Magnesium: 1.8 mg/dL (ref 1.7–2.4)

## 2023-12-15 MED ORDER — INSULIN LISPRO (1 UNIT DIAL) 100 UNIT/ML (KWIKPEN)
14.0000 [IU] | PEN_INJECTOR | Freq: Three times a day (TID) | SUBCUTANEOUS | 11 refills | Status: DC
  Filled 2023-12-15: qty 15, 31d supply, fill #0

## 2023-12-15 MED ORDER — INSULIN GLARGINE 100 UNIT/ML ~~LOC~~ SOLN
20.0000 [IU] | Freq: Every day | SUBCUTANEOUS | 0 refills | Status: DC
  Filled 2023-12-15: qty 10, 50d supply, fill #0

## 2023-12-15 MED ORDER — GENGRAF 25 MG PO CAPS
ORAL_CAPSULE | ORAL | 0 refills | Status: DC
  Filled 2023-12-15: qty 300, 30d supply, fill #0
  Filled 2023-12-16: qty 290, 29d supply, fill #0
  Filled 2023-12-16: qty 10, 1d supply, fill #0

## 2023-12-15 MED ORDER — MELATONIN 3 MG PO TABS
3.0000 mg | ORAL_TABLET | Freq: Every day | ORAL | 0 refills | Status: AC
  Filled 2023-12-15: qty 30, 30d supply, fill #0

## 2023-12-15 MED ORDER — EZETIMIBE 10 MG PO TABS
10.0000 mg | ORAL_TABLET | Freq: Every day | ORAL | 0 refills | Status: DC
  Filled 2023-12-15: qty 30, 30d supply, fill #0

## 2023-12-15 MED ORDER — PREGABALIN 75 MG PO CAPS
75.0000 mg | ORAL_CAPSULE | Freq: Every day | ORAL | 0 refills | Status: DC
  Filled 2023-12-15: qty 30, 30d supply, fill #0

## 2023-12-15 MED ORDER — CALCIUM ACETATE (PHOS BINDER) 667 MG PO CAPS
1334.0000 mg | ORAL_CAPSULE | Freq: Two times a day (BID) | ORAL | 0 refills | Status: AC
  Filled 2023-12-15: qty 90, 23d supply, fill #0

## 2023-12-15 MED ORDER — CARVEDILOL 25 MG PO TABS
25.0000 mg | ORAL_TABLET | Freq: Two times a day (BID) | ORAL | 0 refills | Status: AC
  Filled 2023-12-15: qty 60, 30d supply, fill #0

## 2023-12-15 MED ORDER — PANTOPRAZOLE SODIUM 40 MG PO TBEC
40.0000 mg | DELAYED_RELEASE_TABLET | Freq: Every day | ORAL | 0 refills | Status: DC
  Filled 2023-12-15: qty 30, 30d supply, fill #0

## 2023-12-15 MED ORDER — ATORVASTATIN CALCIUM 10 MG PO TABS
10.0000 mg | ORAL_TABLET | Freq: Every day | ORAL | 0 refills | Status: DC
  Filled 2023-12-15: qty 30, 30d supply, fill #0

## 2023-12-15 MED ORDER — OXCARBAZEPINE 300 MG PO TABS: 300.0000 mg | ORAL_TABLET | ORAL | Status: DC

## 2023-12-15 MED ORDER — LIDOCAINE 5 % EX PTCH
2.0000 | MEDICATED_PATCH | CUTANEOUS | 0 refills | Status: AC
  Filled 2023-12-15: qty 60, 30d supply, fill #0

## 2023-12-15 MED ORDER — PREDNISONE 5 MG PO TABS
5.0000 mg | ORAL_TABLET | Freq: Every day | ORAL | 0 refills | Status: DC
  Filled 2023-12-15: qty 30, 30d supply, fill #0

## 2023-12-15 MED ORDER — LOSARTAN POTASSIUM 100 MG PO TABS
100.0000 mg | ORAL_TABLET | Freq: Every day | ORAL | 0 refills | Status: DC
Start: 2023-12-16 — End: 2024-01-07
  Filled 2023-12-15: qty 30, 30d supply, fill #0

## 2023-12-15 MED ORDER — DICLOFENAC SODIUM 1 % EX GEL
2.0000 g | Freq: Four times a day (QID) | CUTANEOUS | 0 refills | Status: AC | PRN
Start: 2023-12-15 — End: ?
  Filled 2023-12-15: qty 100, 12d supply, fill #0

## 2023-12-15 MED ORDER — VALGANCICLOVIR HCL 450 MG PO TABS
900.0000 mg | ORAL_TABLET | Freq: Every day | ORAL | 0 refills | Status: DC
  Filled 2023-12-15: qty 60, 30d supply, fill #0

## 2023-12-15 MED ORDER — MAGNESIUM OXIDE -MG SUPPLEMENT 400 (240 MG) MG PO TABS
2.0000 | ORAL_TABLET | Freq: Two times a day (BID) | ORAL | 0 refills | Status: AC
Start: 2023-12-15 — End: ?
  Filled 2023-12-15: qty 120, 30d supply, fill #0

## 2023-12-15 MED ORDER — VALGANCICLOVIR HCL 450 MG PO TABS
900.0000 mg | ORAL_TABLET | Freq: Every day | ORAL | 0 refills | Status: AC
  Filled 2023-12-15: qty 60, 30d supply, fill #0

## 2023-12-15 MED ORDER — TRAZODONE HCL 50 MG PO TABS
50.0000 mg | ORAL_TABLET | Freq: Every evening | ORAL | 0 refills | Status: DC | PRN
  Filled 2023-12-15: qty 30, 30d supply, fill #0

## 2023-12-15 NOTE — Progress Notes (Signed)
 PROGRESS NOTE  Pt reports I'm good just sleepy- he reports he stayed up late last night and likes to do that- didn't want to wake up for AM therapy and missed 8am therapy.  Kept falling asleep after I spoke to him and woke him up-   Per chart, pt had a witnessed fall today at 0915-  hit arm/ shoulder- just sore- didn't need xrays  Review of systems:  Per HPI  Pt denies SOB, abd pain, CP, N/V/C/D, and vision changes , joint pain  + R hand tingling/numbness-continued    Objective:   No results found.  Recent Labs    12/15/23 0549  WBC 4.9  HGB 9.9*  HCT 30.9*  PLT 224      Recent Labs    12/15/23 0549  NA 138  K 4.4  CL 102  CO2 26  GLUCOSE 322*  BUN 23*  CREATININE 1.19  CALCIUM  8.9     Intake/Output Summary (Last 24 hours) at 12/15/2023 1449 Last data filed at 12/15/2023 0707 Gross per 24 hour  Intake 500 ml  Output 1175 ml  Net -675 ml        Physical Exam: Vital Signs Blood pressure 110/73, pulse 90, temperature 98.4 F (36.9 C), resp. rate 18, height 5' 11 (1.803 m), weight 86.4 kg, SpO2 100%.    General: awake, alert, after multiple attempts to wake him up- sitting up in bed; NAD HENT: conjugate gaze; oropharynx moist CV: regular rate; no JVD Pulmonary: CTA B/L; no W/R/R- slightly decreased at bases GI: soft, NT, ND, (+)BS Psychiatric: flat, sleepy Neurological: sleepy- didn't want to wake up- he mentioned- stayed up late    Neurologic: Awake and alert, cranial nerves II through XII grossly intact,, follows commands  Sensory exam normal sensation to light touch is reduced on the left side below the knee as well as in the left upper extremity  Altered sensation palmar 1st and 2nd digit right hand--unchanged  Musculoskeletal: No joint swelling , No hand tenderness today, no chest wall soreness noted Wearing brace LUE 4/5 right upper and lower extremities strength Left upper  extremity 2- out of 5 Delt, bi tri finger flexors and extensors Left lower extremity hip flexion 3 out of 5, knee extension 4-/5, ankle plantarflexion 2 out of 5, ankle dorsiflexion 0-1 out of 5 Physical exam unchanged from the above on reexamination 12/15/23     Assessment/Plan: 1. Functional deficits which require 3+ hours per day of interdisciplinary therapy in a comprehensive inpatient rehab setting. Physiatrist is providing close team supervision and 24 hour management of active medical problems listed below. Physiatrist and rehab team continue to assess barriers to discharge/monitor patient progress toward functional and medical goals  Care Tool:  Bathing    Body parts bathed by patient: Left lower leg, Left arm, Chest, Abdomen, Front perineal area, Right upper leg, Left upper leg, Face, Right lower leg   Body parts bathed by helper: Right arm, Buttocks     Bathing assist Assist Level: Minimal Assistance - Patient > 75%     Upper Body Dressing/Undressing Upper body dressing   What is the patient wearing?: Pull over shirt    Upper  body assist Assist Level: Minimal Assistance - Patient > 75%    Lower Body Dressing/Undressing Lower body dressing      What is the patient wearing?: Pants, Underwear/pull up     Lower body assist Assist for lower body dressing: Minimal Assistance - Patient > 75%     Toileting Toileting    Toileting assist Assist for toileting: Minimal Assistance - Patient > 75%     Transfers Chair/bed transfer  Transfers assist     Chair/bed transfer assist level: Minimal Assistance - Patient > 75%     Locomotion Ambulation   Ambulation assist      Assist level: Maximal Assistance - Patient 25 - 49% Assistive device: Other (comment) (R hallway HR) Max distance: 15 ft   Walk 10 feet activity   Assist     Assist level: Maximal Assistance - Patient 25 - 49% Assistive device: Other (comment) (R hallway HR)   Walk 50 feet  activity   Assist Walk 50 feet with 2 turns activity did not occur: Safety/medical concerns         Walk 150 feet activity   Assist Walk 150 feet activity did not occur: Safety/medical concerns         Walk 10 feet on uneven surface  activity   Assist Walk 10 feet on uneven surfaces activity did not occur: Safety/medical concerns         Wheelchair     Assist Is the patient using a wheelchair?: Yes Type of Wheelchair: Manual    Wheelchair assist level: Dependent - Patient 0% Max wheelchair distance: 250 ft    Wheelchair 50 feet with 2 turns activity    Assist        Assist Level: Dependent - Patient 0%   Wheelchair 150 feet activity     Assist      Assist Level: Dependent - Patient 0%   Blood pressure 110/73, pulse 90, temperature 98.4 F (36.9 C), resp. rate 18, height 5' 11 (1.803 m), weight 86.4 kg, SpO2 100%.  Medical Problem List and Plan: 1. Functional deficits secondary to debility following PEA arrest             -patient may  shower             -ELOS/Goals: 18-21d MinA  Con't CIR- Has mats since had fall this AM-   - Expected discharge 9/2  2.  Antithrombotics: -DVT/anticoagulation:  Pharmaceutical: Lovenox              -antiplatelet therapy: N/A due to IPH 3. Pain Management: Tylenol  prn Been on Lyrica  at home for diabetic neuropathic pain. 4. Mood/Behavior/Sleep: LCSW to follow for evaluation and support.              -antipsychotic agents: N/A 5. Neuropsych/cognition: This patient is not capable of making decisions on his own behalf without input from [facility].  - Patient was seen by neuropsychology, appreciate insight  -Dysphagia: Prior Use of cortrak before admission to CIR was evaluated on admission by SLP, mild facial and oral weakness, did ok with bedside swallow eval and continued on regular thin diet. Passed FEES 7/17 6. Skin/Wound Care: Routine pressure relief measures. Monitor incision for healing.    - 8/24: ECMO  site scabbed over; stop aquacel. Informed nursing to keep dry given location  7. Fluids/Electrolytes/Nutrition: Strict I/O. Renal diet with CM restrictions --no fluid restriction needed 8. PEA arrest X 2/CMV viremia: on Valcyte  900 mg daily thru 10/26 with monitoring of levels  every 2 weeks post completion.  Pharmacy consult  9. Heart transplant: On Gengraf  BID with prednisone  daily             --continue Coreg , lower dose Lipitor  and Zetia .  Bactrim  until 8/13 Heart Failure team will follow -appreciate assistance -Called  [place] [facility] heart transplant, cyclosporine  level added for Monday.  -[name] discussed [employer] was contacted about his pacemaker and working correctly.  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   - 8/22: Per cardiology note: CMV titer returned with positive, <200 viral load. Will continue current oral therapy with valganciclovir . Will make further adjustments to cyclosporine  based on his titer. Timing of administration reviewed, should be an accurate trough.   -Patient remained hemodynamically stable over the weekend, cardiology last saw 8-22.     -8/25 Increase losartan  to 100mg , will notify cardiology, cyclosporine  level was 107 on 8/21  -8/25 Reviewed cardiology noted, cylosporine dose has been adjusted to 125mg  Qam and 100mg  at bedtime, continue current dose losartan   -8/29 patient seen by heart failure team today, cyclosporine  level pending  9/1- last cyclosporine  level 104-    Vitals:   12/15/23 0534 12/15/23 0536 12/15/23 0915 12/15/23 1415  BP: (!) 131/93  121/88 110/73  Pulse: 87  87 90  Resp: 18  18 18   Temp: 97.8 F (36.6 C)  98.2 F (36.8 C) 98.4 F (36.9 C)  TempSrc:   Oral   SpO2:  99%  100% 100%  Weight:  86.4 kg    Height:           10. Elevated phosphorous: Will keep on liberalized diet for food choices and to help with intake             --continue Phoslo  1334 tid ac--recheck level in am.  -8/14 Phos mildly elevated today, continue to monitor trend.  Continue PhosLo   Recheck levels--normalized 8-16  11. Hx seizures: continue Keppra  and Trileptal .   12. 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  Monitor BS ac/hs. Continue Insulin  glargine with novolog  tid ac  CBG (last 3)  Recent Labs    12/14/23 1649 12/15/23 0559 12/15/23 1134  GLUCAP 180* 286* 116*    Has Freestyle libre which needs to be transcribed into EPIC- while I was in room today , alarm sounded and was 170, Glucometer reading 10 min  later 159 - cont current meds 8/31 but have asked nsg to record additional CBGs from today 8/31 Labile CBGs  appears to be intake related 9/1- CBG's [phone]- going home tomorrow- cotninues to have snacks at bedside 13. Anemia of chronic disease: Monitor H/H for stability.   -8/14 Hemoglobin stable at 9.6  -8/28 hemoglobin stable at 9.8  14. R hand numbness: Possible carpal tunnel  -continue carpal tunnel wrist splint use at night  - Consider outpatient EMG  -Voltaren  gel PRN- discussed with patient  - 8-23: Encouraged compliance with nighttime brace; can take 4 to 6 weeks to see benefit  15, Diastolic hypertension: 2grams IV mag ordered, coreg  increased to 25mg  BID -8/18 BP stable this morning Addendum BP appears elevated this evening, per chart review appears patient noted to be more lethargic and complaining of chest discomfort.  Appears PA has contacted cardiology and rapid response  has been called.  - 8/19 cardiology has adjusted losartan  dose, possible increase again tomorrow.  Appreciate assistance  -8/20 cardiology increase losartan  to 50 mg, continue to monitor response  - 8/22, cardiology has increased losartan   dose to 75 mg, appreciate assistance--still slightly elevated, but will give a few days for adjustment.  8-24: Diastolic uptrending, otherwise stable.  Defer adjustment to cardiology in AM.  8/25 losartan  increased as above  8/28 fair BP control, continue to monitor  9/1- Weight 86.4 down from 87.7 kg    12/15/2023    2:15 PM 12/15/2023    9:15 AM 12/15/2023    5:36 AM  Vitals with BMI  Weight   190 lbs 8 oz  BMI   26.58  Systolic 110 121   Diastolic 73 88   Pulse 90 87   BP meds Per cardiology   16.  Hypomagnesemia.  - 8/18 patient received IV mag over the weekend, started on mag oxide 400 mg daily by cardiology  - 8/19 IV magnesium  has been ordered by cardiology  -8/20 MG up to 1.6, continue supplementation  -8/22 magnesium  1.6, continue oral supplementation  8-24: Mag 1.5 last 2 days, but has stayed stable; increase supplement to 800 mg twice daily.  8/27-8 stable at 1.6, continue current supplementation Recheck 9/1  9/1- Mg level 1.8!  17 .  Mild AKI.  Resolved, back to baseline  9/1- BUN 23- very slightly increased- but  it's a balancing act   I spent a total of  35  minutes on total care today- >50% coordination of care- due to  D/w PA about fall and review of labs, vitals and weights-   LOS: 21 days A FACE TO FACE EVALUATION WAS PERFORMED  Jason Stevenson 12/15/2023, 2:49 PM

## 2023-12-15 NOTE — Plan of Care (Signed)
  Problem: RH Problem Solving Goal: LTG Patient will demonstrate problem solving for (SLP) Description: LTG:  Patient will demonstrate problem solving for basic/complex daily situations with cues  (SLP) Outcome: Completed/Met   Problem: RH Memory Goal: LTG Patient will use memory compensatory aids to (SLP) Description: LTG:  Patient will use memory compensatory aids to recall biographical/new, daily complex information with cues (SLP) Outcome: Completed/Met   Problem: RH Attention Goal: LTG Patient will demonstrate this level of attention during functional activites (SLP) Description: LTG:  Patient will will demonstrate this level of attention during functional activites (SLP) Outcome: Completed/Met   Problem: RH Expression Communication Goal: LTG Patient will increase speech intelligibility (SLP) Description: LTG: Patient will increase speech intelligibility at word/phrase/conversation level with cues, % of the time (SLP) Outcome: Completed/Met

## 2023-12-15 NOTE — Plan of Care (Signed)
  Problem: RH Dressing Goal: LTG Patient will perform upper body dressing (OT) Description: LTG Patient will perform upper body dressing with assist, with/without cues (OT). Outcome: Not Met (add Reason) Note: Pt requires min A for UB dressing d/t problem solving deficits and L hemiplegia. Pt's wife is able to provide necessary level of assistance.    Problem: RH Balance Goal: LTG Patient will maintain dynamic standing with ADLs (OT) Description: LTG:  Patient will maintain dynamic standing balance with assist during activities of daily living (OT)  Outcome: Completed/Met   Problem: Sit to Stand Goal: LTG:  Patient will perform sit to stand in prep for activites of daily living with assistance level (OT) Description: LTG:  Patient will perform sit to stand in prep for activites of daily living with assistance level (OT) Outcome: Completed/Met   Problem: RH Dressing Goal: LTG Patient will perform lower body dressing w/assist (OT) Description: LTG: Patient will perform lower body dressing with assist, with/without cues in positioning using equipment (OT) Outcome: Completed/Met   Problem: RH Toileting Goal: LTG Patient will perform toileting task (3/3 steps) with assistance level (OT) Description: LTG: Patient will perform toileting task (3/3 steps) with assistance level (OT)  Outcome: Completed/Met   Problem: RH Functional Use of Upper Extremity Goal: LTG Patient will use RT/LT upper extremity as a (OT) Description: LTG: Patient will use right/left upper extremity as a stabilizer/gross assist/diminished/nondominant/dominant level with assist, with/without cues during functional activity (OT) Outcome: Completed/Met   Problem: RH Toilet Transfers Goal: LTG Patient will perform toilet transfers w/assist (OT) Description: LTG: Patient will perform toilet transfers with assist, with/without cues using equipment (OT) Outcome: Completed/Met   Problem: RH Tub/Shower Transfers Goal: LTG  Patient will perform tub/shower transfers w/assist (OT) Description: LTG: Patient will perform tub/shower transfers with assist, with/without cues using equipment (OT) Outcome: Completed/Met   Problem: RH Attention Goal: LTG Patient will demonstrate this level of attention during functional activites (OT) Description: LTG:  Patient will demonstrate this level of attention during functional activites  (OT) Outcome: Completed/Met   Problem: RH Furniture Transfers Goal: LTG Patient will perform furniture transfers w/assist (OT/PT) Description: LTG: Patient will perform furniture transfers  with assistance (OT/PT). Outcome: Completed/Met

## 2023-12-15 NOTE — Progress Notes (Signed)
 Physical Therapy Session Note  Patient Details  Name: Jason Stevenson MRN: [id number] Date of Birth: [birth year]  Today's Date: 12/15/2023 PT Individual Time: 1530-1600, 9099-9044  PT Individual Time Calculation (min): 30 min 55 min  Short Term Goals: Week 3:  PT Short Term Goal 1 (Week 3): STG = LTG d/t ELOS  Skilled Therapeutic Interventions/Progress Updates:    Session 1: Pt in bed upon arrival, agreeable to PT session. Max assist provided for LB dressing. Pt reports need to use the bathroom before leaving room. Assisted using hemiwalker to bathroom with min assist. Once in bathroom, pt report urgency and caught Lt foot and leaning into wall with left shoulder. PT providing physical assist to regain balance prior to continuing on to toilet. Pt did not strike head or have any reports of pain or discomfort and nursing notified. Pt having large BM with total assist for pericare with cues for posture and hand placement for stability. Stand pivot performed to w/c with min assist using HW following. Upon completion, pt left in care of OT for next session. Bed mobility performed with supervision, transfers grossly min assist with cues for safety.  Session 2: Pt in bed upon arrival and agreeable to session. Supervision supine to sitting EOB. Squat pivot performed with min assist. Sit<>stand from w/c also min assist with HW. Gait: pt ambulated up/down 4 stairs with rail and min assist with LLE for placement. Amb with HW 25 ft and min/CGA - distance limited due to fatigue. Simulated car transfer with transfers, supervision level. W/c mobility performed- supervision with cues for compensating for Left drift and safety x`150 ft. Following session, pt up in w/c with call light and all needs met. Chair alarm on and activated.   Therapy Documentation Precautions:  Precautions Precautions: Fall Recall of Precautions/Restrictions: Intact Precaution/Restrictions Comments: L Hemiplegia Restrictions Weight  Bearing Restrictions Per Provider Order: No   Pain: Denies pain both sessions.     Therapy/Group: Individual Therapy  [name] 12/15/2023, 4:29 PM

## 2023-12-15 NOTE — Discharge Summary (Signed)
 Physician Discharge Summary  Patient ID: Jason Stevenson MRN: [id number] DOB/AGE: 40/21/1987 40 y.o.  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 The lungs are clear. There is no pleural effusion or pneumothorax. Sternotomy wires are present. No acute fractures are identified. IMPRESSION: Cardiomegaly. No acute cardiopulmonary process. Electronically Signed   By: Greig Pique M.D.   On: 11/24/2023 20:58    Labs:  Basic Metabolic Panel: Recent Labs  Lab 12/10/23 0427 12/11/23 0455 12/15/23 0549  NA  --  141 138  K  --  4.0 4.4  CL  --  106 102  CO2  --  26 26  GLUCOSE  --  206* 322*  BUN  --  17 23*  CREATININE  --  1.00 1.19  CALCIUM   --  9.2 8.9  MG 1.6* 1.6* 1.8    CBC:    Latest Ref Rng & Units 12/15/2023    5:49 AM 12/11/2023    4:55 AM 12/08/2023    5:01 AM  CBC  WBC 4.0 - 10.5 K/uL 4.9  5.0  6.1   Hemoglobin 13.0 - 17.0 g/dL 9.9  9.8  9.7   Hematocrit 39.0 - 52.0 % 30.9  29.7  29.8   Platelets 150 - 400 K/uL 224  230  272     CBG: Recent Labs  Lab 12/14/23 2051 12/15/23 0559 12/15/23 1134 12/15/23 1641 12/15/23 2111  GLUCAP 300* 286* 116* 137* 174*    Brief HPI:   Jason Stevenson is a 38 y.o. 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  Patient underwent rehab then discharged home on 09/18/2023.  He was taken to ED on 06/18 due to weakness with hypotension diarrhea and lethargy and felt to be  septic therefore transferred to [facility] for management.  Hospital course was significant for PEA arrest with 5 rounds of CPR with ROSC, repeat arrest 06/18 requiring ECMO through 06/21.  He was treated with broad-spectrum antibiotics for C. 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  Hospital course was significant for respiratory arrest requiring intubation and eventual tracheostomy, ESBL Kleb HCAP, CMV viremia, pleural effusions, renal failure requiring HD, acute blood loss anemia requiring multiple units PRBC as well as dysphagia.  He was decannulated by 11/17/2022 and diet advanced.    Chest tubes were removed by 08/03.  Cardiology and endocrinology have been following to assist with medical issues.  Renal status was improved and he can treated on renal diet due to elevated phosphorus with binders.  He was tolerating regular textures with thin liquids.  Per discharge summary patient transition to ganciclovir dose on 07/26 with plans for treatment close and every 2 weeks CMV PCR upon cessation x 3 months.  Therapy was consulted and patient was noted to be deconditioned and requiring assistance with ADLs and mobility.  CIR was recommended due to functional decline.   Hospital Course: Jason Stevenson was admitted to rehab 11/24/2023 for inpatient therapies to consist of PT,  ST and OT at least three hours five days a week. Past admission physiatrist, therapy team and rehab RN have worked together to provide customized collaborative inpatient rehab. Blood pressures were monitored on TID basis and has been controlled with increase in losartan  to 100 mg   .  Heart failure team was consulted to follow along for monitoring for cardiac needs and input on medication changes as indicated.  He completed course of bactrim  on 08/13 and respiratory has been stable. CyA trough level on 08/14 noted to be 274 and dose  was decreased to 100 mg BID per input from [facility] transplant team with  recheck in 7 days at 107 and repeat on 08/28 @ 104 therefore dose titrated back up to 125 mg bid on 08/31. CMV quant DNA PCR positive for < 200.   Hypomagnesemia has improved with supplementation. Serial CBC showed WBC to be WNL and anemia is stable.   He has been seizure free on home dose of Keppra  and Trileptal . Diet was liberalized to regular to help with food choices and intake. He continues on Phoslo  with phosphorous level at 4.6 on last check.  Po intake has improved. His diabetes has been monitored with ac/hs CBG checks and SSI was use prn for tighter BS control. BS have been controlled overall with some highs due to dietary indiscretion.  Prior tracheostomy site is healing well.  Most likely scabbed over and aquacel  DC'd on 08/24.  Right hand numbness probably due to possible carpal tunnel syndrome and splint was added for use at night in addition to Voltaren  gel as needed.  Endurance and activity tolerance has improved and patient is currently at min assist level.  He will continue to receive follow-up home health PT, OT, ST and RN by [facility] after discharge.    Rehab course: During patient's stay in rehab weekly team conferences were held to monitor patient's progress, set goals and discuss barriers to discharge. At admission, patient required max assist with mobility and with ADL tasks.  He exhibited mild cognitive deficits and speech intelligibility was at 90% due to debility v/s baseline.  He  has had improvement in activity tolerance, balance, postural control as well as ability to compensate for deficits.  He requires min assist to complete ADL tasks. He is able to ambulate short distances with min assist and propel his wheelchair with supervision to min assist. He is able to complete cognitive tasks with supervision and requires supervision to utilize compensatory strategies to increase speech volume.  Family education has been completed.    Disposition:  Home w/home  health  Diet: Cardiac/Carb Modified.   Special Instructions: Monitor BS ac/hs and follow up with PCP for adjustment of insulin   Discharge Instructions     Ambulatory referral to Neurology   Complete by: As directed    An appointment is requested in approximately: stroke follow up   Ambulatory referral to Physical Medicine Rehab   Complete by: As directed    Hospital follow up      Allergies as of 12/16/2023   No Known Allergies      Medication List     STOP taking these medications    amantadine 100 MG capsule Commonly known as: SYMMETREL   amLODipine 5 MG tablet Commonly known as: NORVASC   aspirin  EC 81 MG tablet   CALTRATE 600+D PO   cycloSPORINE  modified 25 MG capsule Commonly known as: GENGRAF  Replaced by: Gengraf  25 MG capsule   DULoxetine  60  MG capsule Commonly known as: CYMBALTA    furosemide  20 MG tablet Commonly known as: LASIX    insulin  glargine 100 UNIT/ML injection Commonly known as: LANTUS  Replaced by: Lantus  SoloStar 100 UNIT/ML Solostar Pen   mycophenolate 250 MG capsule Commonly known as: CELLCEPT    polyethylene glycol 17 g packet Commonly known as: MIRALAX / GLYCOLAX   propranolol 10 MG tablet Commonly known as: INDERAL   rosuvastatin  10 MG tablet Commonly known as: CRESTOR    sulfamethoxazole -trimethoprim  400-80 MG tablet Commonly known as: BACTRIM    tacrolimus  1 MG capsule Commonly known as: PROGRAF        TAKE these medications    Accu-Chek Guide Me w/Device Kit USE TO CHECK BLOOD SUGAR THREE TIMES DAILY. What changed: Another medication with the same name was added. Make sure you understand how and when to take each.   Accu-Chek Guide w/Device Kit Use 3 (three) times daily. May dispense any manufacturer covered by patient's insurance. What changed: You were already taking a medication with the same name, and this prescription was added. Make sure you understand how and when to take each.   Accu-Chek Guide test  strip Generic drug: glucose blood USE TO CHECK BLOOD SUGAR THREE TIMES DAILY. What changed: Another medication with the same name was added. Make sure you understand how and when to take each.   Accu-Chek Guide Test test strip Generic drug: glucose blood Use 3 (three) times daily as directed to check blood sugar. May dispense any manufacturer covered by patient's insurance and fits patient's device. What changed: You were already taking a medication with the same name, and this prescription was added. Make sure you understand how and when to take each.   Accu-Chek Softclix Lancets lancets Use 3 (three) times daily as directed to check blood sugar. May dispense any manufacturer covered by patient's insurance and fits patient's device.   acetaminophen  325 MG tablet Commonly known as: TYLENOL  Take 1-2 tablets (325-650 mg total) by mouth every 4 (four) hours as needed for mild pain.   atorvastatin  10 MG tablet Commonly known as: LIPITOR  Take 1 tablet (10 mg total) by mouth at bedtime. What changed: Another medication with the same name was removed. Continue taking this medication, and follow the directions you see here.   Blood Pressure Monitor Devi Use as directed to check home blood pressure 2-3 times a week   calcium  acetate 667 MG capsule Commonly known as: PHOSLO  Take 2 capsules (1,334 mg total) by mouth in the morning and at bedtime.   carvedilol  25 MG tablet Commonly known as: COREG  Take 1 tablet (25 mg total) by mouth 2 (two) times daily with a meal. What changed:  medication strength how much to take   diclofenac  Sodium 1 % Gel Commonly known as: VOLTAREN  Apply 2 g topically 4 (four) times daily as needed (pain).   Easy Comfort Pen Needles 32G X 4 MM Misc Generic drug: Insulin  Pen Needle USE TO INJECT INSULIN  FOUR TIMES A DAY What changed: Another medication with the same name was added. Make sure you understand how and when to take each.   TechLite Pen Needles 32G X  4 MM Misc Generic drug: Insulin  Pen Needle Use as directed with insulin  pens. What changed: You were already taking a medication with the same name, and this prescription was added. Make sure you understand how and when to take each.   Insulin  Pen Needle 32G X 4 MM Misc 1 each by Does not apply route 3 (three) times daily. May  dispense any manufacturer covered by patient's insurance. What changed: You were already taking a medication with the same name, and this prescription was added. Make sure you understand how and when to take each.   ezetimibe  10 MG tablet Commonly known as: ZETIA  Take 1 tablet (10 mg total) by mouth daily.   fluticasone 50 MCG/ACT nasal spray Commonly known as: FLONASE Place 1 spray into both nostrils as needed for allergies.   FreeStyle Libre 2 Sensor Misc UAD   Gengraf  25 MG capsule Generic drug: cycloSPORINE  modified Take 5 capsules (125mg ) by mouth in the morning and at bedtime. Replaces: cycloSPORINE  modified 25 MG capsule   GLUCAGON NA Place 3 sprays into the nose once as needed.   HumaLOG  KwikPen 100 UNIT/ML KwikPen Generic drug: insulin  lispro Inject 14-16 Units into the skin 3 (three) times daily. 8 am,  noon and 6 pm with meals. What changed:  when to take this additional instructions Notes to patient: You have been using 8 units in the hospital. Resume home dose if blood sugars run over 150.    Insulin  Syringe-Needle U-100 31G X 15/64 0.3 ML Misc Commonly known as: ReliOn Insulin  Syringe Use to inject insulin  daily.   Lancet Device Misc 1 each by Does not apply route 3 (three) times daily. May dispense any manufacturer covered by patient's insurance.   Lantus  SoloStar 100 UNIT/ML Solostar Pen Generic drug: insulin  glargine Inject 20 Units into the skin at bedtime. Replaces: insulin  glargine 100 UNIT/ML injection   levETIRAcetam  750 MG tablet Commonly known as: KEPPRA  Take 2 tablets (1,500 mg total) by mouth 2 (two) times  daily. What changed: See the new instructions.   lidocaine  5 % Commonly known as: LIDODERM  Place 2 patches onto the skin daily. Remove & Discard patch within 12 hours or as directed by MD   losartan  100 MG tablet Commonly known as: COZAAR  Take 1 tablet (100 mg total) by mouth daily.   magnesium  oxide 400 (240 Mg) MG tablet Commonly known as: MAG-OX Take 2 tablets (800 mg total) by mouth 2 (two) times daily. What changed: how much to take   melatonin 3 MG Tabs tablet Take 1 tablet (3 mg total) by mouth at bedtime.   Oxcarbazepine  300 MG tablet Commonly known as: TRILEPTAL  Take 1 tablet by mouth every morning and take 2 tablets by mouth before bedtime. What changed: See the new instructions.   pantoprazole  40 MG tablet Commonly known as: PROTONIX  Take 1 tablet (40 mg total) by mouth daily.   predniSONE  5 MG tablet Commonly known as: DELTASONE  Take 1 tablet (5 mg total) by mouth daily. What changed: additional instructions   pregabalin  75 MG capsule Commonly known as: LYRICA  Take 1 capsule (75 mg total) by mouth at bedtime.   senna-docusate 8.6-50 MG tablet Commonly known as: Senokot-S Take 1-2 tablets by mouth 2 (two) times daily as needed for mild constipation.   traZODone  50 MG tablet Commonly known as: DESYREL  Take 1 tablet (50 mg total) by mouth at bedtime as needed for sleep.   valGANciclovir  450 MG tablet Commonly known as: VALCYTE  Take 2 tablets (900 mg total) by mouth daily with breakfast. What changed: additional instructions        Follow-up Information     Vicci Barnie NOVAK, MD Follow up.   Specialty: Internal Medicine Why: Call in 1-2 days for post hospital follow up Contact information: [address] [employer] [address] [place] [address] [phone]  Kirsteins, Prentice BRAVO, MD Follow up.   Specialty: Physical Medicine and Rehabilitation Why: office will call you with follow up appointment Contact information: [address] [place] [address] [phone]         System, [facility] Follow up on 12/26/2023.   Why: appointment at 10 am. Contact information: [address] [place] [address] [phone]         Nivia Charletta BIRCH, MD Follow up on 01/13/2024.   Specialty: Internal Medicine Why: appointment at Contact information: [address] [place] [address] [phone]                 Signed: Sharlet GORMAN Schmitz 12/19/2023, 5:59 PM

## 2023-12-15 NOTE — Progress Notes (Signed)
 Patient ID: Jason Stevenson, male   DOB: 1985/06/11, 38 y.o.   MRN: [id number] family was in for family training on Friday and Sat and it went well. Plan for discharge tomorrow. Should had loaner wheelchair via [employer] and hemi-walker via [employer]. Ready for discharge tomorrow

## 2023-12-15 NOTE — Progress Notes (Signed)
 Occupational Therapy Discharge Summary  Patient Details  Name: Jason Stevenson MRN: [id number] Date of Birth: Nov 06, 1985  Date of Discharge from OT service:December 15, 2023  Today's Date: 12/15/2023 OT Individual Time: 8993-8884 OT Individual Time Calculation (min): 69 min    Patient has met 9 of 10 long term goals due to improved activity tolerance, improved balance, postural control, ability to compensate for deficits, functional use of  LEFT upper and LEFT lower extremity, improved attention, improved awareness, and improved coordination.  Patient to discharge at [facility] level.  Patient's care partner is independent to provide the necessary physical and cognitive assistance at discharge.    Reasons goals not met: Pt continues to require min A for UB dressing d/t functional deficits of LUE and problem solving.   Recommendation:  Patient will benefit from ongoing skilled OT services in outpatient setting to continue to advance functional skills in the area of BADL, iADL, and Reduce care partner burden.  Equipment: No equipment provided Pt owns TTB and drop arm BSC.  Reasons for discharge: treatment goals met and discharge from hospital  Patient/family agrees with progress made and goals achieved: Yes  OT Discharge Skilled Therapeutic Intervention/Progress Updates:  Pt received sitting in w/c presenting to be in good spirits receptive to skilled OT session reporting 0/10 pain- OT offering intermittent rest breaks, repositioning, and therapeutic support to optimize participation in therapy session. Pt requesting to take shower this AM- focused this session on ADL retraining with emphasis on safety and use of hemi-techniques. Spent time at beginning of session completing grad day activities and reassessment for Pt's functional status (see below documentation). Pt completed stand pivot transfers using grab bar in shower wc<>TTB with MIN A this session with verbal cues required for  technique +increased time. Improved stepping and awareness of L LE noted. Pt able to maintain dynamic sitting balance throughout shower this session with SBA, no LOB noted and utilize reacher to wash B LEs with min verbal cues to ensure cleanliness. Assistance provided to wash R UE and to wash buttocks in standing position. Pt able to complete lateral leans to wash buttocks, however assistance provided to ensure cleanliness. Following shower, U/LB dressing completed in wc using hemi-techniques and reacher. Donned OH shirt with MIN A required to bring shirt overhead. Utilized reacher to position pants and weave feet with SUP and he stood to hemi-walker with CGA to bring pants to waist with MIN A required to adjust pants over L hip and light MIN A required for standing balance. Pt reporting no questions at end of session and reporting he feels prepared to d/c home tomorrow. Pt was left resting in w/c with call bell in reach and all needs met.   Precautions/Restrictions  Precautions Precautions: Fall Recall of Precautions/Restrictions: Intact Precaution/Restrictions Comments: L Hemiplegia Restrictions Weight Bearing Restrictions Per Provider Order: No Pain Pain Assessment Pain Scale: 0-10 Pain Score: 0-No pain ADL ADL Equipment Provided: Reacher, Leg lifter, Dressing stick Eating: Set up Where Assessed-Eating: Chair Grooming: Minimal assistance Where Assessed-Grooming: Wheelchair Upper Body Bathing: Minimal assistance Where Assessed-Upper Body Bathing: Shower Lower Body Bathing: Minimal assistance Where Assessed-Lower Body Bathing: Shower Upper Body Dressing: Minimal assistance Where Assessed-Upper Body Dressing: Wheelchair Lower Body Dressing: Minimal assistance Where Assessed-Lower Body Dressing: Wheelchair Toileting: Minimal assistance Where Assessed-Toileting: Toilet, Bedside Commode Toilet Transfer: Minimal assistance Toilet Transfer Method: Stand Wellsite geologist:  Grab bars, Raised toilet seat Tub/Shower Transfer: Minimal assistance Tub/Shower Transfer Method: Stand pivot Tub/Shower Equipment: Walk in shower [employer]  Shower Transfer: Insurance underwriter Method: Warden/ranger: Grab bars, Sales promotion account executive Baseline Vision/History: 1 Wears glasses Patient Visual Report: No change from baseline Vision Assessment?: Yes Eye Alignment: Within Functional Limits Ocular Range of Motion: Within Functional Limits Alignment/Gaze Preference: Within Defined Limits Tracking/Visual Pursuits: Able to track stimulus in all quads without difficulty Saccades: Decreased speed of saccadic movement Convergence: Within functional limits Visual Fields: No apparent deficits Perception  Perception: Impaired Perception-Other Comments: improving L inattention Praxis Praxis: Impaired Praxis Impairment Details: Motor planning;Initiation Praxis-Other Comments: slow to plan and initate but improving from eval Cognition Cognition Overall Cognitive Status: Impaired/Different from baseline Arousal/Alertness: Awake/alert Orientation Level: Person;Place;Situation Person: Oriented Place: Oriented Situation: Oriented Memory: Appears intact Attention: Sustained;Focused Focused Attention: Impaired Sustained Attention: Impaired Sustained Attention Impairment: Functional basic Awareness: Appears intact Problem Solving: Appears intact Executive Function: Self Correcting;Initiating Initiating: Impaired Self Correcting: Impaired Safety/Judgment: Impaired Brief Interview for Mental Status (BIMS) Repetition of Three Words (First Attempt): 3 Temporal Orientation: Year: Correct Temporal Orientation: Month: Accurate within 5 days Temporal Orientation: Day: Correct Recall: Sock: Yes, no cue required Recall: Blue: Yes, no cue required Recall: Bed: Yes, no cue required BIMS Summary Score: 15 Sensation Sensation Light  Touch: Impaired by gross assessment Proprioception: Impaired Detail Proprioception Impaired Details: Impaired LUE;Impaired LLE Stereognosis: Impaired Detail Stereognosis Impaired Details: Impaired LUE;Impaired LLE Coordination Gross Motor Movements are Fluid and Coordinated: No Fine Motor Movements are Fluid and Coordinated: No Coordination and Movement Description: coordination deficit d/t L hemiplegia Finger Nose Finger Test: smooth equal movements on R, unable to complete on L Motor  Motor Motor: Hemiplegia;Motor impersistence Motor - Skilled Clinical Observations: L hemiplegia Motor - Discharge Observations: LUE hemiplegia, LLE hemipareisis Mobility  Bed Mobility Bed Mobility: Rolling Right;Rolling Left;Supine to Sit Rolling Right: Supervision/verbal cueing Rolling Left: Supervision/Verbal cueing Supine to Sit: Contact Guard/Touching assist;Supervision/Verbal cueing Transfers Sit to Stand: Contact Guard/Touching assist Stand to Sit: Contact Guard/Touching assist  Trunk/Postural Assessment  Cervical Assessment Cervical Assessment: Exceptions to [facility] (forward head) Thoracic Assessment Thoracic Assessment: Exceptions to [facility] (rounded shoulers) Lumbar Assessment Lumbar Assessment: Within Functional Limits Postural Control Postural Control: Deficits on evaluation Trunk Control: decreased, but improved from eval Righting Reactions: decreased, but improved from eval  Balance Balance Balance Assessed: Yes Static Sitting Balance Static Sitting - Balance Support: Feet supported Static Sitting - Level of Assistance: 6: Modified independent (Device/Increase time);5: Stand by assistance Dynamic Sitting Balance Dynamic Sitting - Balance Support: Feet supported;During functional activity Dynamic Sitting - Level of Assistance: 5: Stand by assistance Static Standing Balance Static Standing - Balance Support: During functional activity;Left upper extremity supported Static Standing -  Level of Assistance: 4: Min assist;5: Stand by assistance Dynamic Standing Balance Dynamic Standing - Balance Support: During functional activity;Right upper extremity supported Dynamic Standing - Level of Assistance: 4: Min assist Extremity/Trunk Assessment RUE Assessment RUE Assessment: Within Functional Limits LUE Assessment LUE Assessment: Exceptions to [facility] General Strength Comments: 2-/5 grasp extension limited d/t tone, 1/5 wrist flex/extension, 2-/5 bicep/tricep, 2/5 shoulder shrug LUE Tone LUE Tone: Moderate   Katheryn SQUIBB Woodson 12/15/2023, 10:58 AM

## 2023-12-15 NOTE — Progress Notes (Signed)
 Speech Language Pathology Discharge Summary  Patient Details  Name: Jason Stevenson MRN: [id number] Date of Birth: 1985-04-25  Date of Discharge from SLP service:December 15, 2023  Today's Date: 12/15/2023 SLP Individual Time: 8761-8676 SLP Individual Time Calculation (min): 45 min   Skilled Therapeutic Interventions:   Skilled therapy session focused on cognitive and communication goals. SLP facilitated sessison by discussing d/c plan w/ patient. Patient was 100% intelligible and utilized speech intelligibility strategies w/ supervision A. SLP targeted cognitive goals through re-evaluation of cognitive linguistic skills. Patient scored WFL on all subtests which is an improvement from evaluation. At the end of the session, SLP prompted completion of EMST set at 30cm H2O. Patient left in chair with alarm set and call bell in reach. Continue POC.     Patient has met 4 of 4 long term goals.  Patient to discharge at overall Supervision level.  Reasons goals not met: n/a   Clinical Impression/Discharge Summary: Pt has made great gains and has met 4 of 4 LTG's this admission due to improved cognition and communication. Pt is currently an overall supervision A for cognitive tasks and supervision cues for utilization of speech compensatory strategies to increase vocal intensity. Pt/family education complete and pt will discharge home with supervision from friends/family/etc. No f/u ST services warranted.  Care Partner:  Caregiver Able to Provide Assistance: Yes  Type of Caregiver Assistance: Physical;Cognitive  Recommendation:  None      Equipment: n/a   Reasons for discharge: Treatment goals met;Discharged from hospital   Patient/Family Agrees with Progress Made and Goals Achieved: Yes   Daenerys Buttram M.A., CCC-SLP 12/15/2023, 12:05 PM

## 2023-12-15 NOTE — Progress Notes (Signed)
   12/15/23 0915  What Happened  Was fall witnessed? Yes  Who witnessed fall? Ben  Patients activity before fall ambulating-assisted  Point of contact arm/shoulder  Was patient injured? Yes  Provider Notification  Provider Name/Title Sharlet Schmitz, [place]  Date Provider Notified 12/15/23  Time Provider Notified 0915  Method of Notification Face-to-face  Notification Reason Fall  Provider response In department  Date of Provider Response 12/15/23  Time of Provider Response 0915  Follow Up  Family notified Yes - comment  Time family notified 0915  Additional tests No  Adult Fall Risk Assessment  Risk Factor Category (scoring not indicated) High fall risk per protocol (document High fall risk)  Patient Fall Risk Level High fall risk  Adult Fall Risk Interventions  Required Bundle Interventions *See Row Information* High fall risk - low, moderate, and high requirements implemented  Additional Interventions Other (Comment);Use of appropriate toileting equipment (bedpan, BSC, etc.)  Fall intervention(s) refused/Patient educated regarding refusal Bed alarm;Nonskid socks;Open door if unsupervised;Yellow bracelet;Supervision while toileting/edge of bed sitting  Screening for Fall Injury Risk (To be completed on HIGH fall risk patients) - Assessing Need for Floor Mats  Risk For Fall Injury- Criteria for Floor Mats None identified - No additional interventions needed  Will Implement Floor Mats Yes  Vitals  Temp 98.2 F (36.8 C)  Temp Source Oral  BP 121/88  MAP (mmHg) 100  BP Location Right Arm  BP Method Automatic  Patient Position (if appropriate) Lying  Pulse Rate 87  Pulse Rate Source Monitor  Resp 18  Oxygen Therapy  SpO2 100 %  O2 Device Room Air  Pain Assessment  Pain Scale 0-10  Pain Score 0  PCA/Epidural/Spinal Assessment  Respiratory Pattern Regular;Unlabored  Neurological  Neuro (WDL) X  Level of Consciousness Alert  Orientation Level Oriented X4  Cognition Follows  commands  Speech Clear  Motor Function/Sensation Assessment Grip;Motor response  R Hand Grip Moderate  L Hand Grip Absent  RUE Motor Response Purposeful movement  LUE Motor Response Non-purposeful movement  RLE Motor Response Purposeful movement  LLE Motor Response Purposeful movement  Neuro Symptoms None  Musculoskeletal  Musculoskeletal (WDL) X  Assistive Device Stedy  Generalized Weakness Yes  Weight Bearing Restrictions Per Provider Order No  Integumentary  Integumentary (WDL) WDL  Skin Color Appropriate for ethnicity  Skin Integrity Intact  Skin Turgor Non-tenting

## 2023-12-16 ENCOUNTER — Other Ambulatory Visit (HOSPITAL_COMMUNITY): Payer: Self-pay

## 2023-12-16 ENCOUNTER — Other Ambulatory Visit: Payer: Self-pay

## 2023-12-16 ENCOUNTER — Telehealth (HOSPITAL_COMMUNITY): Payer: Self-pay | Admitting: Pharmacy Technician

## 2023-12-16 ENCOUNTER — Other Ambulatory Visit (HOSPITAL_BASED_OUTPATIENT_CLINIC_OR_DEPARTMENT_OTHER): Payer: Self-pay

## 2023-12-16 DIAGNOSIS — I5022 Chronic systolic (congestive) heart failure: Secondary | ICD-10-CM | POA: Diagnosis not present

## 2023-12-16 DIAGNOSIS — Z941 Heart transplant status: Secondary | ICD-10-CM | POA: Diagnosis not present

## 2023-12-16 DIAGNOSIS — I619 Nontraumatic intracerebral hemorrhage, unspecified: Secondary | ICD-10-CM | POA: Diagnosis not present

## 2023-12-16 DIAGNOSIS — R5381 Other malaise: Secondary | ICD-10-CM | POA: Diagnosis not present

## 2023-12-16 DIAGNOSIS — Z8679 Personal history of other diseases of the circulatory system: Secondary | ICD-10-CM | POA: Diagnosis not present

## 2023-12-16 LAB — GLUCOSE, CAPILLARY
Glucose-Capillary: 99 mg/dL (ref 70–99)
Glucose-Capillary: 139 mg/dL — ABNORMAL HIGH (ref 70–99)

## 2023-12-16 MED ORDER — INSULIN PEN NEEDLE 32G X 4 MM MISC
1.0000 | Freq: Three times a day (TID) | 0 refills | Status: AC
  Filled 2023-12-16: qty 100, 30d supply, fill #0

## 2023-12-16 MED ORDER — ACCU-CHEK SOFTCLIX LANCETS MISC
1.0000 | Freq: Three times a day (TID) | 0 refills | Status: AC
  Filled 2023-12-16: qty 100, 30d supply, fill #0

## 2023-12-16 MED ORDER — BLOOD GLUCOSE TEST VI STRP
1.0000 | ORAL_STRIP | Freq: Three times a day (TID) | 0 refills | Status: AC
  Filled 2023-12-16: qty 100, 34d supply, fill #0

## 2023-12-16 MED ORDER — OXCARBAZEPINE 300 MG PO TABS
ORAL_TABLET | ORAL | 0 refills | Status: DC
  Filled 2023-12-16: qty 90, 30d supply, fill #0

## 2023-12-16 MED ORDER — PREDNISONE 5 MG PO TABS: 5.0000 mg | ORAL_TABLET | Freq: Every day | ORAL | 0 refills | Status: AC

## 2023-12-16 MED ORDER — INSULIN GLARGINE 100 UNIT/ML SOLOSTAR PEN
20.0000 [IU] | PEN_INJECTOR | Freq: Every day | SUBCUTANEOUS | 0 refills | Status: DC
  Filled 2023-12-16: qty 15, 75d supply, fill #0

## 2023-12-16 MED ORDER — INSULIN PEN NEEDLE 32G X 4 MM MISC
0 refills | Status: AC
Start: 2023-12-16 — End: ?
  Filled 2023-12-16: qty 100, 30d supply, fill #0

## 2023-12-16 MED ORDER — PANTOPRAZOLE SODIUM 40 MG PO TBEC: 40.0000 mg | DELAYED_RELEASE_TABLET | Freq: Every day | ORAL | 0 refills | Status: AC

## 2023-12-16 MED ORDER — BLOOD GLUCOSE MONITOR SYSTEM W/DEVICE KIT
1.0000 | PACK | Freq: Three times a day (TID) | 0 refills | Status: AC
Start: 2023-12-16 — End: ?
  Filled 2023-12-16: qty 1, 30d supply, fill #0

## 2023-12-16 MED ORDER — LANCET DEVICE MISC
1.0000 | Freq: Three times a day (TID) | 0 refills | Status: AC
  Filled 2023-12-16: qty 1, fill #0

## 2023-12-16 MED ORDER — LEVETIRACETAM 750 MG PO TABS
1500.0000 mg | ORAL_TABLET | Freq: Two times a day (BID) | ORAL | 0 refills | Status: DC
  Filled 2023-12-16: qty 120, 30d supply, fill #0

## 2023-12-16 MED ORDER — GENGRAF 25 MG PO CAPS
ORAL_CAPSULE | ORAL | 0 refills | Status: AC
  Filled 2023-12-17: qty 300, 30d supply, fill #0

## 2023-12-16 NOTE — Plan of Care (Signed)
  Problem: Consults Goal: RH GENERAL PATIENT EDUCATION Description: See Patient Education module for education specifics. Outcome: Progressing   Problem: RH BOWEL ELIMINATION Goal: RH STG MANAGE BOWEL WITH ASSISTANCE Description: STG Manage Bowel with mod I Assistance. Outcome: Progressing Goal: RH STG MANAGE BOWEL W/MEDICATION W/ASSISTANCE Description: STG Manage Bowel with Medication with mod I Assistance. Outcome: Progressing   Problem: RH BLADDER ELIMINATION Goal: RH STG MANAGE BLADDER WITH ASSISTANCE Description: STG Manage Bladder With mod I Assistance Outcome: Progressing   Problem: RH SKIN INTEGRITY Goal: RH STG SKIN FREE OF INFECTION/BREAKDOWN Description: Manage w min assist Outcome: Progressing   Problem: Education: Goal: Ability to describe self-care measures that may prevent or decrease complications (Diabetes Survival Skills Education) will improve Outcome: Progressing Goal: Individualized Educational Video(s) Outcome: Progressing   Problem: RH KNOWLEDGE DEFICIT GENERAL Goal: RH STG INCREASE KNOWLEDGE OF SELF CARE AFTER HOSPITALIZATION Description: Patient and wife will be able to manage care at discharge using educational resources independently Outcome: Progressing   Problem: Tissue Perfusion: Goal: Adequacy of tissue perfusion will improve Outcome: Progressing   Problem: Skin Integrity: Goal: Risk for impaired skin integrity will decrease Outcome: Progressing

## 2023-12-16 NOTE — Progress Notes (Addendum)
 Inpatient Rehabilitation Care Coordinator Discharge Note   Patient Details  Name: Jason Stevenson MRN: [id number] Date of Birth: [birth year]   Discharge location: HOME TO SISTER'S HOME WHICH IS MORE ACCESSIBLE AND WIFE IS THERE AND WORKS FROM HOME  Length of Stay: 22 DAYS  Discharge activity level: MIN LEVEL  Home/community participation: ACTIVE  Patient response un:Yzjouy Literacy - How often do you need to have someone help you when you read instructions, pamphlets, or other written material from your doctor or pharmacy?: Never  Patient response un:Dnrpjo Isolation - How often do you feel lonely or isolated from those around you?: Never  Services provided included: MD, RD, PT, OT, SLP, RN, CM, TR, Pharmacy, Neuropsych, SW  Financial Services:  Field seismologist Utilized: Private Insurance [employer]  Choices offered to/list presented to: WIFE  Follow-up services arranged:  Home Health, DME, Patient/Family request agency HH/DME Home Health Agency: [facility]  PT  OT  RN  SP    DME : [employer] [facility] HH/DME Requested Agency: [facility]  HANDICAPPED APPLICATION GIVEN TO WIFE FOR PLACARD Patient response to transportation need: Is the patient able to respond to transportation needs?: Yes In the past 12 months, has lack of transportation kept you from medical appointments or from getting medications?: No In the past 12 months, has lack of transportation kept you from meetings, work, or from getting things needed for daily living?: No   Patient/Family verbalized understanding of follow-up arrangements:  Yes  Individual responsible for coordination of the follow-up plan: Jason Stevenson [phone]  Confirmed correct DME delivered: Jason Stevenson Jason Stevenson 12/16/2023    Comments (or additional information):FAMILY WAS IN FOR FAMILY EDUCATION AWARE OF HIS CARE NEEDS, READY FOR DISCHARGE HOME  Summary of Stay     Date/Time Discharge Planning CSW  12/10/23 0845 Family coming in for education n Friday from 1:00-4:00 and have another session for Sat if needed. Aware of pt's need for 24/7 care and [facility] set up via [facility] RGD  12/03/23 0922 Pt progressing in therpaies, wife will be main caregiver and will be going to sister's home upon discharge. Await team's recommendations RGD  11/26/23 0859 Home with wife to sister's home where he has been staying since getting out of CIR in 09/2023 after CVA. Wife works from home and can assist, along with other family members who are there. Aware will need to be 24/7 care at DC RGD       Jason Stevenson

## 2023-12-16 NOTE — Progress Notes (Signed)
 Inpatient Rehabilitation Discharge Medication Review by a Pharmacist  A complete drug regimen review was completed for this patient to identify any potential clinically significant medication issues.  High Risk Drug Classes Is patient taking? Indication by Medication  Antipsychotic No   Anticoagulant No   Antibiotic No   Opioid No   Antiplatelet No   Hypoglycemics/insulin  Yes Lantus , humalog : DM2  Vasoactive Medication Yes Losartan , Coreg : HTN  Chemotherapy No   Other Yes Tylenol , voltaren  gel, lidoderm  patch: pain Valganciclovir : CMV viremia (3 months) Lipitor , Zetia : HLD Cyclosporine , Prednisone : transplant Calcium  acetate: hyperphosphatemia Protonix : GERD Senokot: constipation Melatonin, trazodone : sleep Keppra , Trileptal : seizures Lyrica : neuropathy Magnesium : supplement Flonase: rhinits     Type of Medication Issue Identified Description of Issue Recommendation(s)  Drug Interaction(s) (clinically significant)     Duplicate Therapy     Allergy     No Medication Administration End Date     Incorrect Dose     Additional Drug Therapy Needed     Significant med changes from prior encounter (inform family/care partners about these prior to discharge). D/c amantadine, tacrolimus , mycophenolate, amlodipine, duloxetine , aspirin , Lasix , propranolol, Crestor , Bactrim  Restart or discontinue as appropriate. Communicate medication changes with patient/family at discharge  Other       Clinically significant medication issues were identified that warrant physician communication and completion of prescribed/recommended actions by midnight of the next day:  No   Time spent performing this drug regimen review (minutes): 40  Thank you for allowing pharmacy to be a part of this patient's care.   Bascom JAYSON Louder, PharmD 12/16/2023 7:40 AM   **Pharmacist phone directory can be found on [email or web address] listed under [facility]**

## 2023-12-16 NOTE — Progress Notes (Signed)
 Physical Therapy Discharge Summary  Patient Details  Name: Jason Stevenson MRN: [id number] Date of Birth: Aug 01, 1985  Date of Discharge from PT service:December 15, 2023  Patient has met 8 of 11 long term goals due to {due un:6958322}.  Patient to discharge at [facility] level {LOA:3049010}.   Patient's care partner {care partner:3041650} to provide the necessary {assistance:3041652} assistance at discharge.  Reasons goals not met: ***  Recommendation:  Patient will benefit from ongoing skilled PT services in {setting:3041680} to continue to advance safe functional mobility, address ongoing impairments in ***, and minimize fall risk.  Equipment: {equipment:3041657}  Reasons for discharge: {Reason for discharge:3049018}  Patient/family agrees with progress made and goals achieved: {Pt/Family agree with progress/goals:3049020}  PT Discharge Precautions/Restrictions Precautions Precautions: Fall Precaution/Restrictions Comments: L Hemiparesis (has improved since evaluation) Restrictions Weight Bearing Restrictions Per Provider Order: No Pain Pain Assessment Pain Scale: 0-10 Pain Score: 0-No pain Pain Interference Pain Interference Pain Effect on Sleep: 1. Rarely or not at all Pain Interference with Therapy Activities: 1. Rarely or not at all Pain Interference with Day-to-Day Activities: 1. Rarely or not at all Vision/Perception  Vision - History Ability to See in Adequate Light: 0 Adequate  Cognition Overall Cognitive Status: Within Functional Limits for tasks assessed Arousal/Alertness: Awake/alert Orientation Level: Oriented X4 Attention: Sustained;Focused Focused Attention: Impaired Sustained Attention: Impaired Awareness: Appears intact Safety/Judgment: Impaired Sensation Sensation Light Touch: Impaired by gross assessment Proprioception: Impaired by gross assessment Coordination Gross Motor Movements are Fluid and Coordinated: No Coordination and  Movement Description: coordination deficit d/t L hemiparesis Motor  Motor Motor: Other (comment);Motor impersistence (hemiparesis) Motor - Discharge Observations: LUE hemiplegia, LLE hemipareisis. Decreased coordination/motor planning to place L LE during swing phase (increased time in hip extension to coordinate step)  Mobility Bed Mobility Bed Mobility: Supine to Sit;Sit to Supine;Rolling Right Rolling Right: Supervision/verbal cueing Supine to Sit: Supervision/Verbal cueing Sit to Supine: Supervision/Verbal cueing Transfers Transfers: Sit to Stand;Stand to Sit;Stand Pivot Transfers Sit to Stand: Contact Guard/Touching assist Stand to Sit: Contact Guard/Touching assist Stand Pivot Transfers: Minimal Assistance - Patient > 75% Stand Pivot Transfer Details: Verbal cues for sequencing;Verbal cues for precautions/safety;Verbal cues for safe use of DME/AE;Verbal cues for technique Transfer (Assistive device): Hemi-walker Locomotion  Gait Ambulation: Yes Gait Assistance: Minimal Assistance - Patient > 75% Assistive device: Hemi-walker Gait Assistance Details: Verbal cues for sequencing;Verbal cues for technique;Verbal cues for precautions/safety;Verbal cues for gait pattern;Verbal cues for safe use of DME/AE Gait Gait: Yes Gait Pattern: Impaired Gait Pattern: Step-to pattern;Decreased step length - right;Decreased step length - left;Decreased weight shift to left;Decreased dorsiflexion - left;Left flexed knee in stance;Narrow base of support;Poor foot clearance - left Gait velocity: significantly reduced Stairs / Additional Locomotion Stairs: Yes Stairs Assistance: Minimal Assistance - Patient > 75% Number of Stairs: 4 Wheelchair Mobility Wheelchair Mobility: Yes Wheelchair Assistance: Minimal assistance - Patient >75% Wheelchair Propulsion: Both lower extermities Wheelchair Parts Management: Needs assistance;Supervision/cueing Distance: 100  Trunk/Postural Assessment  Cervical  Assessment Cervical Assessment: Within Functional Limits Thoracic Assessment Thoracic Assessment: Exceptions to [facility] (rounded shoulders) Lumbar Assessment Lumbar Assessment: Within Functional Limits Postural Control Postural Control: Deficits on evaluation Trunk Control: decreased, but improved from eval Righting Reactions: decreased, but improved from eval  Balance Balance Balance Assessed: Yes Static Sitting Balance Static Sitting - Balance Support: Feet supported Static Sitting - Level of Assistance: 6: Modified independent (Device/Increase time) Dynamic Sitting Balance Dynamic Sitting - Balance Support: Feet supported;During functional activity Dynamic Sitting - Level of Assistance: 6: Modified independent (Device/Increase time) Static  Standing Balance Static Standing - Balance Support: During functional activity;Bilateral upper extremity supported Static Standing - Level of Assistance: 5: Stand by assistance Dynamic Standing Balance Dynamic Standing - Balance Support: During functional activity;Right upper extremity supported Dynamic Standing - Level of Assistance: 4: Min assist Extremity Assessment  RLE Assessment RLE Assessment: Exceptions to [facility] General Strength Comments: grossly 3-4/5 LLE Assessment LLE Assessment: Exceptions to [facility] General Strength Comments: Grossly 3- to 3+/5 (not formally assessed)   [name] PTA   12/16/2023, 12:52 PM

## 2023-12-16 NOTE — Progress Notes (Addendum)
 Advanced Heart Failure Rounding Note  Cardiologist: Maude Emmer, MD  HF Cardiologist: Dr. Rolan Chief Complaint: OHT, Assistance with immunosuppression Subjective:    Vitals stable. Feeling well this morning, excited to discharge. No complaints. Anticipate discharge today.     Objective:    Weight Range: 89.5 kg Body mass index is 27.52 kg/m.   Vital Signs:   Temp:  [97.9 F (36.6 C)-98.7 F (37.1 C)] 97.9 F (36.6 C) (09/02 0432) Pulse Rate:  [87-95] 88 (09/02 0432) Resp:  [14-18] 14 (09/02 0432) BP: (110-139)/(73-88) 115/78 (09/02 0432) SpO2:  [94 %-100 %] 94 % (09/02 0714) Weight:  [89.5 kg] 89.5 kg (09/02 0432) Last BM Date : 12/13/23  Weight change: Filed Weights   12/14/23 0500 12/15/23 0536 12/16/23 0432  Weight: 87.7 kg 86.4 kg 89.5 kg   Intake/Output:  Intake/Output Summary (Last 24 hours) at 12/16/2023 0821 Last data filed at 12/16/2023 0754 Gross per 24 hour  Intake 436 ml  Output 550 ml  Net -114 ml    Physical Exam   General: Well appearing. No distress on RA Cardiac: JVP flat. S1 and S2 present. No murmurs or rub. Resp: Lung sounds clear and equal B/L Extremities: Warm and dry.  No edema.  Neuro: Alert and oriented x3. Affect pleasant.   Labs    CBC Recent Labs    12/15/23 0549  WBC 4.9  HGB 9.9*  HCT 30.9*  MCV 88.5  PLT 224   Basic Metabolic Panel Recent Labs    90/98/74 0549  NA 138  K 4.4  CL 102  CO2 26  GLUCOSE 322*  BUN 23*  CREATININE 1.19  CALCIUM  8.9  MG 1.8   Medications:    Scheduled Medications:  atorvastatin   10 mg Oral Q supper   budesonide  (PULMICORT ) nebulizer solution  0.25 mg Nebulization BID   calcium  acetate  1,334 mg Oral TID WC   carvedilol   25 mg Oral BID WC   cycloSPORINE  modified  125 mg Oral Daily   cycloSPORINE  modified  125 mg Oral QPM   ezetimibe   10 mg Oral Daily   heparin  injection (subcutaneous)  5,000 Units Subcutaneous Q8H   insulin  aspart  0-15 Units Subcutaneous TID WC    insulin  aspart  6 Units Subcutaneous TID WC   insulin  glargine  20 Units Subcutaneous QHS   levETIRAcetam   1,500 mg Oral BID   lidocaine   2 patch Transdermal Q24H   losartan   100 mg Oral Daily   magnesium  oxide  800 mg Oral BID   melatonin  3 mg Oral QHS   OXcarbazepine   300 mg Oral Daily   OXcarbazepine   600 mg Oral QHS   pantoprazole   40 mg Oral Daily   predniSONE   5 mg Oral Q breakfast   pregabalin   75 mg Oral QHS   Ensure Max Protein  11 oz Oral BID   protein supplement  1 Scoop Oral TID WC   traZODone   50 mg Oral QHS   valGANciclovir   900 mg Oral Daily   Infusions:  PRN Medications: acetaminophen , alum & mag hydroxide-simeth, bisacodyl , diclofenac  Sodium, diphenhydrAMINE , guaiFENesin -dextromethorphan, ipratropium-albuterol , prochlorperazine  **OR** prochlorperazine  **OR** prochlorperazine , senna-docusate, sodium phosphate  Patient Profile   38 y.o. 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   Transferred to CIR 11/24/23 after prolonged hospitalization at [facility] w/ CMV viremia c/b  PEA arrest, subsequent [facility] ECMO cannulation >>VV ECMO and eventual trach.  Assessment/Plan   HFrEF d/t NICM s/p OHT 8/24 - s/p OHT 11/26/22. Last EMBx in 3/25 negative with normal EF.  - TTE pre-arrest with EF 40% and severely reduced RV function - s/p PEA arrest (see below) - RHC 10/15/23 with normal CI and elevated PCWP. EMBx taken 7/2 - TTE 7/29 with normal biV function.  - completed Bactrim  8/13 - continue prednisone  5 mg daily - continue atorva 10 mg at bedtime + zetia  10 mg daily - continue coreg  25 mg bid - continue losartan  100mg  daily - continue CSA 125mg  bid. Discussed with [facility] & pharmD. No clinical signs of infection at this time.  - Next CSA level 9/4. Will adjust immunosuppression accordingly. Otherwise doing  well; euvolemic on exam.   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    PEA arrest on 10/01/23 - s/p VA ECLS transitioned to VV ECMO 6/21. De-cannulated 6/25. Trach placed.   - presumed respiratory etiology, possibly d/t unwitnessed seizure - ESBL Kleb HCAP s/p meropenem  - trach de-cannulated 8/4   H/o CVA - stroke with hemorrhagic conversion in 2022 and again in 4/25 - residual L sided deficits and seizures - continue keppra  and trileptal   - Appreciate CIR rehab  HTN - plan as above.   Length of Stay: 53  Jason Lee, NP  12/16/2023, 8:21 AM  Advanced Heart Failure Team Pager [phone] (M-F; 7a - 5p)  Please contact [facility] for night-coverage after hours (5p -7a ) and weekends on [email or web address]  Agree with the above note, I formulated the plan.   Doing well today, plan for discharge.  He will followup at the [facility] transplant clinic after discharge.   Jason Stevenson 12/16/2023

## 2023-12-16 NOTE — Progress Notes (Signed)
 Discharge instructions provided by Sharlet Schmitz, PA. Medications received from TOC. Staff assisted patient off the unit; patient discharge via private car w/wife   Geni Armor, LPN

## 2023-12-16 NOTE — Telephone Encounter (Signed)
 Pharmacy Patient Advocate Encounter   Received notification from Inpatient Request that prior authorization for Lidocaine  5% patches   is required/requested.   Insurance verification completed.   The patient is insured through [facility] MEDICAID .   Per test claim: PA required; PA submitted to above mentioned insurance via Latent Key/confirmation #/EOC [facility] Status is pending

## 2023-12-16 NOTE — Progress Notes (Signed)
 PROGRESS NOTE  No issues overnite, aware of d/c Spoke with pt's wife via phone , pt has Endo at [facility] and appt in couple weeks  Review of systems:  Per HPI  Pt denies SOB, abd pain, CP, N/V/C/D, and vision changes , joint pain  + R hand tingling/numbness-continued    Objective:   No results found.  Recent Labs    12/15/23 0549  WBC 4.9  HGB 9.9*  HCT 30.9*  PLT 224      Recent Labs    12/15/23 0549  NA 138  K 4.4  CL 102  CO2 26  GLUCOSE 322*  BUN 23*  CREATININE 1.19  CALCIUM  8.9     Intake/Output Summary (Last 24 hours) at 12/16/2023 0813 Last data filed at 12/16/2023 0754 Gross per 24 hour  Intake 554 ml  Output 875 ml  Net -321 ml        Physical Exam: Vital Signs Blood pressure 115/78, pulse 88, temperature 97.9 F (36.6 C), resp. rate 14, height 5' 11 (1.803 m), weight 89.5 kg, SpO2 94%.  General: No acute distress Mood and affect are appropriate Heart: Regular rate and rhythm no rubs murmurs or extra sounds Lungs: Clear to auscultation, breathing unlabored, no rales or wheezes Abdomen: Positive bowel sounds, soft nontender to palpation, nondistended Extremities: No clubbing, cyanosis, or edema Skin: No evidence of breakdown, no evidence of rash Neurologic: Cranial nerves II through XII intact, motor strength is 5/5 in bilateral deltoid, bicep, tricep, grip, hip flexor, knee extensors, ankle dorsiflexor and plantar flexor   Neurologic: Awake and alert, cranial nerves II through XII grossly intact,, follows commands  Sensory exam normal sensation to light touch is reduced on the left side below the knee as well as in the left upper extremity   Musculoskeletal: No joint swelling , No hand tenderness today, no chest wall soreness noted Wearing brace LUE 4/5 right upper and lower extremities strength Left upper extremity 2- out of 5 Delt, tr bi tri finger flexors and extensors Left  lower extremity hip flexion 3 out of 5, knee extension 4-/5, ankle plantarflexion 2 out of 5, ankle dorsiflexion 0-1 out of 5 Physical exam unchanged from the above on reexamination 12/16/23     Assessment/Plan: 1. 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   Stable for D/C today F/u PCP in 1-2 weeks F/u PM&R 3-4 weeks F/u neuro 1-2 mo F/u [facility] End[facility] and cardiology  See D/C summary See D/C instructions   Care Tool:  Bathing    Body parts bathed by patient: Left lower leg, Left arm, Chest, Abdomen, Front perineal area, Right upper leg, Left upper leg, Face, Right lower leg   Body parts bathed by helper: Right arm, Buttocks     Bathing assist Assist Level: Minimal Assistance - Patient > 75%     Upper Body Dressing/Undressing Upper body dressing   What is the patient wearing?: Pull over shirt    Upper body assist Assist Level: Minimal Assistance - Patient > 75%    Lower Body Dressing/Undressing Lower body dressing      What is the patient wearing?: Pants, Underwear/pull up  Lower body assist Assist for lower body dressing: Minimal Assistance - Patient > 75%     Toileting Toileting    Toileting assist Assist for toileting: Minimal Assistance - Patient > 75%     Transfers Chair/bed transfer  Transfers assist     Chair/bed transfer assist level: Minimal Assistance - Patient > 75%     Locomotion Ambulation   Ambulation assist      Assist level: Minimal Assistance - Patient > 75% Assistive device: Walker-hemi Max distance: 25 ft   Walk 10 feet activity   Assist     Assist level: Minimal Assistance - Patient > 75% Assistive device: Walker-hemi   Walk 50 feet activity   Assist Walk 50 feet with 2 turns activity did not occur: Safety/medical concerns         Walk 150 feet activity   Assist Walk 150 feet activity did not occur: Safety/medical concerns         Walk 10 feet on uneven surface   activity   Assist Walk 10 feet on uneven surfaces activity did not occur: Safety/medical concerns         Wheelchair     Assist Is the patient using a wheelchair?: Yes Type of Wheelchair: Manual    Wheelchair assist level: Supervision/Verbal cueing Max wheelchair distance: 250 ft    Wheelchair 50 feet with 2 turns activity    Assist        Assist Level: Supervision/Verbal cueing   Wheelchair 150 feet activity     Assist      Assist Level: Supervision/Verbal cueing   Blood pressure 115/78, pulse 88, temperature 97.9 F (36.6 C), resp. rate 14, height 5' 11 (1.803 m), weight 89.5 kg, SpO2 94%.  Medical Problem List and Plan: 1. Functional deficits secondary to debility following PEA arrest             -patient may  shower             -ELOS/Goals: 18-21d MinA  Con't CIR- Has mats since had fall this AM-   - Expected discharge 9/2  2.  Antithrombotics: -DVT/anticoagulation:  Pharmaceutical: Lovenox              -antiplatelet therapy: N/A due to IPH 3. Pain Management: Tylenol  prn Been on Lyrica  at home for diabetic neuropathic pain. 4. Mood/Behavior/Sleep: LCSW to follow for evaluation and support.              -antipsychotic agents: N/A 5. Neuropsych/cognition: This patient is not capable of making decisions on his own behalf without input from [facility].  - Patient was seen by neuropsychology, appreciate insight  -Dysphagia: Prior Use of cortrak before admission to CIR was evaluated on admission by SLP, mild facial and oral weakness, did ok with bedside swallow eval and continued on regular thin diet. Passed FEES 7/17 6. Skin/Wound Care: Routine pressure relief measures. Monitor incision for healing.    - 8/24: ECMO site scabbed over; stop aquacel. Informed nursing to keep dry given location  7. Fluids/Electrolytes/Nutrition: Strict I/O. Renal diet with CM restrictions --no fluid restriction needed 8. PEA arrest X 2/CMV viremia: on Valcyte  900 mg daily  thru 10/26 with monitoring of levels every 2 weeks post completion.  Pharmacy consult  9. Heart transplant: On Gengraf  BID with prednisone  daily             --continue Coreg , lower dose Lipitor  and Zetia .  Bactrim  until 8/13 Heart Failure team will follow -appreciate assistance -Called [place] [facility]  heart transplant, cyclosporine  level added for Monday.  -[name] discussed [employer] was contacted about his pacemaker and working correctly.  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   - 8/22: Per cardiology note: CMV titer returned with positive, <200 viral load. Will continue current oral therapy with valganciclovir . Will make further adjustments to cyclosporine  based on his titer. Timing of administration reviewed, should be an accurate trough.   -Patient remained hemodynamically stable over the weekend, cardiology last saw 8-22.     -8/25 Increase losartan  to 100mg , will notify cardiology, cyclosporine  level was 107 on 8/21  -8/25 Reviewed cardiology noted, cylosporine dose has been adjusted to 125mg  Qam and 100mg  at bedtime, continue current dose losartan   -8/29 patient seen by heart failure team today, cyclosporine  level pending  9/1- last cyclosporine  level 104-    Vitals:   12/15/23 2037 12/15/23 2118 12/16/23 0432 12/16/23 0714  BP:  139/84 115/78   Pulse:  95 88   Resp:  17 14   Temp:  98.7 F (37.1 C) 97.9 F (36.6 C)   TempSrc:  Oral    SpO2: 100% 100% 94% 94%  Weight:   89.5 kg   Height:           10. Elevated phosphorous: Will keep on liberalized diet for food choices and to help with intake             --continue Phoslo  1334 tid ac--recheck level in am.  -8/14 Phos mildly elevated today, continue to  monitor trend.  Continue PhosLo   Recheck levels--normalized 8-16  11. Hx seizures: continue Keppra  and Trileptal .   12. 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  Monitor BS ac/hs. Continue Insulin  glargine with novolog  tid ac  CBG (last 3)  Recent Labs    12/15/23 1134 12/15/23 1641 12/15/23 2111  GLUCAP 116* 137* 174*  Cont current meds f/u [facility]  13. Anemia of chronic disease: Monitor H/H for stability.   -8/14 Hemoglobin stable at 9.6  -8/28 hemoglobin stable at 9.8  14. R hand numbness: Possible carpal tunnel  -continue carpal tunnel wrist splint use at night  - Consider outpatient EMG  -Voltaren  gel PRN- discussed with patient  - 8-23: Encouraged compliance with nighttime brace; can take 4 to 6 weeks to see benefit  15, Diastolic hypertension: 2grams IV mag ordered, coreg  increased to 25mg  BID -8/18 BP stable this morning Addendum BP appears elevated this evening, per chart review appears patient noted to be more lethargic and complaining of chest discomfort.  Appears PA has contacted cardiology and rapid response has been called.  - 8/19 cardiology has adjusted losartan  dose, possible increase again tomorrow.  Appreciate assistance  -8/20 cardiology increase losartan  to 50 mg, continue to monitor response  - 8/22, cardiology has increased losartan  dose to 75 mg, appreciate assistance--still slightly elevated, but will give a few days for adjustment.  8-24: Diastolic uptrending, otherwise stable.  Defer adjustment to cardiology in AM.  8/25 losartan  increased as above  8/28 fair BP control, continue to monitor  9/1- Weight 86.4 down from 87.7 kg    12/16/2023    4:32 AM 12/15/2023    9:18 PM 12/15/2023    2:15 PM  Vitals with BMI  Weight 197  lbs 5 oz    BMI 27.53    Systolic 115 139 889  Diastolic 78 84 73  Pulse 88 95 90  BP meds Per cardiology   16.  Hypomagnesemia.  - 8/18 patient received IV mag over the  weekend, started on mag oxide 400 mg daily by cardiology  - 8/19 IV magnesium  has been ordered by cardiology  -8/20 MG up to 1.6, continue supplementation  -8/22 magnesium  1.6, continue oral supplementation  8-24: Mag 1.5 last 2 days, but has stayed stable; increase supplement to 800 mg twice daily.  8/27-8 stable at 1.6, continue current supplementation Recheck 9/1  9/1- Mg level 1.8!  17 .  Mild AKI.  Resolved, back to baseline    LOS: 22 days A FACE TO FACE EVALUATION WAS PERFORMED  Prentice FORBES Compton 12/16/2023, 8:13 AM

## 2023-12-16 NOTE — Plan of Care (Signed)
  Problem: RH BOWEL ELIMINATION Goal: RH STG MANAGE BOWEL WITH ASSISTANCE Description: STG Manage Bowel with mod I Assistance. Outcome: Progressing   Problem: RH BLADDER ELIMINATION Goal: RH STG MANAGE BLADDER WITH ASSISTANCE Description: STG Manage Bladder With mod I Assistance Outcome: Progressing   Problem: RH SKIN INTEGRITY Goal: RH STG SKIN FREE OF INFECTION/BREAKDOWN Description: Manage w min assist Outcome: Progressing   Problem: RH SAFETY Goal: RH STG ADHERE TO SAFETY PRECAUTIONS W/ASSISTANCE/DEVICE Description: STG Adhere to Safety Precautions With cues Assistance/Device. Outcome: Progressing   Problem: RH PAIN MANAGEMENT Goal: RH STG PAIN MANAGED AT OR BELOW PT'S PAIN GOAL Description: Pain < 4 with prns Outcome: Progressing

## 2023-12-17 ENCOUNTER — Telehealth: Payer: Self-pay

## 2023-12-17 ENCOUNTER — Other Ambulatory Visit (HOSPITAL_COMMUNITY): Payer: Self-pay

## 2023-12-17 ENCOUNTER — Ambulatory Visit: Payer: Self-pay

## 2023-12-17 ENCOUNTER — Other Ambulatory Visit: Payer: Self-pay

## 2023-12-17 NOTE — Transitions of Care (Post Inpatient/ED Visit) (Signed)
 12/17/2023  Name: Jason Stevenson MRN: [id number] DOB: 1986/04/07  Today'Stevenson TOC FU Call Status: Today'Stevenson TOC FU Call Status:: Successful TOC FU Call Completed TOC FU Call Complete Date: 12/17/23 Patient'Stevenson Name and Date of Birth confirmed.  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 I told her that I would nofiy Dr Jason but she may defer to cardiology.  Dr Jason has not seen him since 03/2023. Patient Questions/Concerns Addressed: Notified Provider of Patient Questions/Concerns  Items Reviewed: Did you receive and understand the discharge instructions provided?: Yes Medications obtained,verified, and reconciled?: Yes (Medications Reviewed) (he has all medications and his wife is currently managing his meds.  regarding the [employer], his wife stated he is taking per AVS instructions and taking 8 units, which he was taking in the hospital and will resume home dose if blood sugars >150) Any new allergies since your discharge?: No Dietary orders reviewed?: Yes Type of Diet Ordered:: heart healthy, low sodium, diabetic Do you have support at home?: Yes People in Home [RPT]: spouse Name of Support/Comfort Primary Source: his wife.  They are currenlty staying with his sister.  Medications Reviewed Today: Medications Reviewed Today     Reviewed by Jason Bradley, RN (Case Manager) on 12/17/23 at 1551  Med List Status: <None>   Medication Order Taking? Sig Documenting  Provider Last Dose Status Informant  ACCU-CHEK GUIDE test strip [id number]  USE TO CHECK BLOOD SUGAR THREE TIMES DAILY. Jason Barnie NOVAK, Stevenson  Active Spouse/Significant Other, Pharmacy Records  Accu-Chek Softclix Lancets lancets [id number]  Use 3 (three) times daily as directed to check blood sugar. May dispense any manufacturer covered by patient'Stevenson insurance and fits patient'Stevenson device. Jason Stevenson  Active   acetaminophen  (TYLENOL ) 325 MG tablet [id number]  Take 1-2 tablets (325-650 mg total) by mouth every 4 (four) hours as needed for mild pain. Jason Ozell Prentice, PA-C  Active Spouse/Significant Other, Pharmacy Records  atorvastatin  (LIPITOR ) 10 MG tablet [id number]  Take 1 tablet (10 mg total) by mouth at bedtime. Jason Sharlet RAMAN, PA-C  Active   Blood Glucose Monitoring Suppl (ACCU-CHEK GUIDE ME) w/Device KIT [id number]  USE TO CHECK BLOOD SUGAR THREE TIMES DAILY. Jason Barnie NOVAK, Stevenson  Active Spouse/Significant Other, Pharmacy Records  Blood Glucose Monitoring Suppl (BLOOD GLUCOSE MONITOR SYSTEM) w/Device KIT [id number]  Use 3 (three) times daily. May dispense any manufacturer covered by patient'Stevenson insurance. Jason Stevenson  Active   Blood Pressure Monitor DEVI [id number]  Use as directed to check home blood pressure 2-3 times a week Jason Barnie NOVAK, Stevenson  Active Spouse/Significant Other, Pharmacy Records  calcium  acetate (PHOSLO ) 667 MG capsule [id number]  Take 2 capsules (1,334 mg total) by mouth in the morning and at bedtime. Jason Sharlet RAMAN, PA-C  Active   carvedilol  (COREG ) 25 MG tablet [id number]  Take 1 tablet (25 mg total) by mouth 2 (two) times daily with a meal. Love, Sharlet RAMAN, PA-C  Active   Continuous Glucose Sensor ([address] [place] [address] [place]) MISC [id number]  UAD Jason Barnie NOVAK, Stevenson  Active Spouse/Significant Other, Pharmacy Records  diclofenac  Sodium (VOLTAREN ) 1 % GEL [id number]  Apply 2 g topically 4 (four) times daily as needed (pain). Jason Sharlet RAMAN, PA-C  Active    EASY COMFORT PEN NEEDLES 32G X 4 MM MISC [id number]  USE TO INJECT INSULIN  FOUR TIMES A DAY Jason Barnie NOVAK, Stevenson  Active Spouse/Significant Other, Pharmacy Records  ezetimibe  (ZETIA ) 10 MG tablet [id number]  Take 1 tablet (10 mg total) by mouth daily. Love, Jason S, PA-C  Active   fluticasone (FLONASE) 50 MCG/ACT nasal spray [id number]  Place 1 spray into both nostrils as needed for allergies. Provider, Historical, Stevenson  Active Spouse/Significant Other, Pharmacy Records  GENGRAF  25 MG capsule [id number]  Take 5 capsules (125mg ) by mouth in the morning and at bedtime. Love, Jason S, PA-C  Active   GLUCAGON [place] [id number]  Place 3 sprays into the nose once as needed. Provider, Historical, Stevenson  Active Spouse/Significant Other, Pharmacy Records  Glucose Blood (BLOOD GLUCOSE TEST STRIPS) STRP [id number]  Use 3 (three) times daily as directed to check blood sugar. May dispense any manufacturer covered by patient'Stevenson insurance and fits patient'Stevenson device. Jason Stevenson  Active   insulin  glargine (LANTUS ) 100 UNIT/ML Solostar Pen [id number]  Inject 20 Units into the skin at bedtime. Jason Sharlet RAMAN, PA-C  Active   insulin  lispro (HUMALOG  KWIKPEN) 100 UNIT/ML KwikPen [id number]  Inject 14-16 Units into the skin 3 (three) times daily. 8 am,  noon and 6 pm with meals. Jason Sharlet RAMAN, PA-C  Active   Insulin  Pen Needle 32G X 4 MM MISC [id number]  Use as directed with insulin  pens. Jason Sharlet RAMAN, PA-C  Active   Insulin  Pen Needle 32G X 4 MM MISC [id number]  1 each by Does not apply route 3 (three) times daily. May dispense any manufacturer covered by patient'Stevenson insurance. Kirsteins, Stevenson BRAVO, Stevenson  Active   Insulin  Syringe-Needle U-100 (RELION INSULIN  SYRINGE) 31G X 15/64 0.3 ML MISC [id number]  Use to inject insulin  daily. Jason Barnie NOVAK, Stevenson  Active Multiple Informants, Spouse/Significant Other, Pharmacy Records  Lancet Device MISC [id number]  1 each by Does not apply route 3 (three) times daily. May dispense any  manufacturer covered by patient'Stevenson insurance. Jason Stevenson  Active   levETIRAcetam  (KEPPRA ) 750 MG tablet [id number]  Take 2 tablets (1,500 mg total) by mouth 2 (two) times daily. Jason Sharlet RAMAN, PA-C  Active   lidocaine  (LIDODERM ) 5 % [id number]  Place 2 patches onto the skin daily. Remove & Discard patch within 12 hours or as directed by Stevenson Love, Sharlet RAMAN, PA-C  Active   losartan  (COZAAR ) 100 MG tablet [id number]  Take 1 tablet (100 mg total) by mouth daily. Jason Sharlet RAMAN, PA-C  Active   magnesium  oxide (MAG-OX) 400 (240 Mg) MG tablet [id number]  Take 2 tablets (800 mg total) by mouth 2 (two) times daily. Jason Sharlet RAMAN, PA-C  Active   melatonin 3 MG TABS tablet [id number]  Take 1 tablet (3 mg total) by mouth at bedtime. Jason Sharlet RAMAN, PA-C  Active   Oxcarbazepine  (TRILEPTAL ) 300 MG tablet [id number]  Take 1 tablet by mouth every morning and take 2 tablets by mouth before bedtime. Jason Sharlet RAMAN, PA-C  Active   pantoprazole  (PROTONIX ) 40 MG tablet [id number]  Take 1 tablet (40 mg total) by mouth daily. Jason Sharlet RAMAN, PA-C  Active   predniSONE  (DELTASONE ) 5 MG tablet [id number]  Take 1 tablet (5 mg total) by mouth daily. Jason Sharlet RAMAN, PA-C  Active   pregabalin  (LYRICA ) 75 MG capsule [id number]  Take 1 capsule (75 mg total) by mouth at bedtime. Love, Jason S, PA-C  Active   senna-docusate (SENOKOT-Stevenson) 8.6-50 MG tablet [id number]  Take 1-2 tablets by mouth 2 (two) times daily as needed for mild constipation.  Patient not taking: Reported on 11/24/2023   Provider, Historical, Stevenson  Active Spouse/Significant Other, Pharmacy Records  traZODone  (DESYREL ) 50 MG tablet [id number]  Take 1 tablet (50 mg total) by mouth at bedtime as needed for sleep. Love, Jason S, PA-C  Active   valGANciclovir  (VALCYTE ) 450 MG tablet [id number]  Take 2 tablets (900 mg total) by mouth daily with breakfast. Love, Sharlet RAMAN, PA-C  Active             Home Care and Equipment/Supplies: Were Home Health Services Ordered?:  Yes Name of Home Health Agency:: [facility] Has Agency set up a time to come to your home?: No (They  received a call from [facility] confirming they have the referral and will be calling them back with a start of care date.) Any new equipment or medical supplies ordered?: Yes Name of Medical supply agency?: [facility]- they are waiting delivery of a loaner wheelchair until the specialized wheelchair is ready.  He received the hemi-walker from the hospital Were you able to get the equipment/medical supplies?: Yes Do you have any questions related to the use of the equipment/supplies?: No  Functional Questionnaire: Do you need assistance with bathing/showering or dressing?: Yes (his wife assists as needed) Do you need assistance with meal preparation?: Yes (his wife assists as needed) Do you need assistance with eating?: No Do you have difficulty maintaining continence: No Do you need assistance with getting out of bed/getting out of a chair/moving?: Yes (He has a hemi walker and his wife assists as needed) Do you have difficulty managing or taking your medications?: Yes (His wife is currently managing the medications.  He has a standard glucometer but is currently using the Freestyle Libre to monitor his blood sugars.)  Follow up appointments reviewed: PCP Follow-up appointment confirmed?: Yes Date of PCP follow-up appointment?: 12/30/23 Follow-up Provider: Dr Delbert.  He preferred to see Dr Jason but the first available appointment with her is not until the middle of October and he wanted to be seen before then. Specialist Hospital Follow-up appointment confirmed?: Yes Date of Specialist follow-up appointment?: 12/26/23 Follow-Up Specialty Provider:: [facility].  01/13/2024- [facility].  01/27/2024- [facility] Do you need transportation to your follow-up appointment?: No Do you understand care options if your condition(Stevenson) worsen?: Yes-patient verbalized  understanding    SIGNATURE Slater Diesel, RN

## 2023-12-17 NOTE — Telephone Encounter (Signed)
 1st attempt made    Message from [place] S sent at 12/17/2023  4:58 PM EDT  Summary: Congestion, cough, scratchy throat   : [name] the spouse of the patient called in along with the spouse wanting to know if it is ok for him to take over the counter cough medicine being he is on so many prescription medications? He has some congestion and a scratchy throat. He would like to speak with a triage nurse

## 2023-12-17 NOTE — Telephone Encounter (Signed)
 FYI Only or Action Required?: Action required by provider: clinical question for provider, update on patient condition, and patient would like to know what OTC medication is safe to take for cold symptoms as patient is s/p transplant.  Patient was last seen in primary care on 03/20/2023 by Vicci Barnie NOVAK, MD.  Called Nurse Triage reporting URI.  Symptoms began yesterday.  Interventions attempted: Nothing.  Symptoms are: unchanged.  Triage Disposition: Home Care      Summary: Congestion, cough, scratchy throat   : [name] the spouse of the patient called in along with the spouse wanting to know if it is ok for him to take over the counter cough medicine being he is on so many prescription medications? He has some congestion and a scratchy throat. He would like to speak with a triage nurse         Reason for Disposition  Common cold with no complications  Answer Assessment - Initial Assessment Questions 1. ONSET: When did the nasal discharge start?      One day ago 2. AMOUNT: How much discharge is there?       3. COUGH: Do you have a cough? If Yes, ask: Describe the color of your mucus. (e.g., clear, white, yellow, green)     Minimal, yellow 4. RESPIRATORY DISTRESS: Describe your breathing.      denies 5. FEVER: Do you have a fever? If Yes, ask: What is your temperature, how was it measured, and when did it start?     Tmax 99.3 this morning 6. SEVERITY: Overall, how bad are you feeling right now? (e.g., doesn't interfere with normal activities, staying home from school/work, staying in bed)      Does not feel very well 7. OTHER SYMPTOMS: Do you have any other symptoms? (e.g., earache, mouth sores, sore throat, wheezing)     denies 8. PREGNANCY: Is there any chance you are pregnant? When was your last menstrual period?     N/A  Protocols used: Common Cold-A-AH

## 2023-12-17 NOTE — Telephone Encounter (Signed)
 The patient's wife said he is doing pretty good. The are currently staying at his sister's home.    His wife is going to be monitoring his BP at home and she would like to know specific parameters for BP monitoring and when to call provider. I told her that I would nofiy Dr Vicci but she may defer to cardiology. Dr Vicci has not seen him since 03/2023.    He wanted to see Dr Vicci but she didn't have an appointment available until the middle of October and he wanted to be see before then. I was able to schedule him with Dr Newlin 12/30/2023.

## 2023-12-18 DIAGNOSIS — D849 Immunodeficiency, unspecified: Secondary | ICD-10-CM | POA: Diagnosis not present

## 2023-12-18 DIAGNOSIS — E119 Type 2 diabetes mellitus without complications: Secondary | ICD-10-CM | POA: Diagnosis not present

## 2023-12-18 DIAGNOSIS — I10 Essential (primary) hypertension: Secondary | ICD-10-CM | POA: Diagnosis not present

## 2023-12-18 DIAGNOSIS — Z6826 Body mass index (BMI) 26.0-26.9, adult: Secondary | ICD-10-CM | POA: Diagnosis not present

## 2023-12-18 DIAGNOSIS — I69118 Other symptoms and signs involving cognitive functions following nontraumatic intracerebral hemorrhage: Secondary | ICD-10-CM | POA: Diagnosis not present

## 2023-12-18 DIAGNOSIS — Z8616 Personal history of COVID-19: Secondary | ICD-10-CM | POA: Diagnosis not present

## 2023-12-18 DIAGNOSIS — Z941 Heart transplant status: Secondary | ICD-10-CM | POA: Diagnosis not present

## 2023-12-18 DIAGNOSIS — Z7952 Long term (current) use of systemic steroids: Secondary | ICD-10-CM | POA: Diagnosis not present

## 2023-12-18 DIAGNOSIS — Z794 Long term (current) use of insulin: Secondary | ICD-10-CM | POA: Diagnosis not present

## 2023-12-18 DIAGNOSIS — F320 Major depressive disorder, single episode, mild: Secondary | ICD-10-CM | POA: Diagnosis not present

## 2023-12-18 DIAGNOSIS — G4089 Other seizures: Secondary | ICD-10-CM | POA: Diagnosis not present

## 2023-12-18 DIAGNOSIS — D649 Anemia, unspecified: Secondary | ICD-10-CM | POA: Diagnosis not present

## 2023-12-18 DIAGNOSIS — G629 Polyneuropathy, unspecified: Secondary | ICD-10-CM | POA: Diagnosis not present

## 2023-12-18 DIAGNOSIS — E43 Unspecified severe protein-calorie malnutrition: Secondary | ICD-10-CM | POA: Diagnosis not present

## 2023-12-18 DIAGNOSIS — E785 Hyperlipidemia, unspecified: Secondary | ICD-10-CM | POA: Diagnosis not present

## 2023-12-18 MED ORDER — BENZONATATE 100 MG PO CAPS: 100.0000 mg | ORAL_CAPSULE | Freq: Two times a day (BID) | ORAL | 0 refills | Status: DC | PRN

## 2023-12-18 NOTE — Telephone Encounter (Signed)
 Can use Vicks Vapor rub under nose for congestion. Rxn written for Tessalon  Perles for cough suppressant. However, rxn would not go through electronically. Please call in to pharmacy of his choice.

## 2023-12-18 NOTE — Telephone Encounter (Signed)
 Advised patient to notify Heart doctors about his symptoms advised that due to his transplant some of the medication he is taking could affect  his immune system and OTC medication my interaction with those medicatons. Patient voiced he would contact his Heart doctor the see what OTC are safe with the medications he is taking and to see if his needs to be seen by them or by his PCP and call us  back if needed. Advised patient and patient's wife that I would forward this message to his PCP and if they had additional recommendations I would call them back with any updates. Patient and patient' wife was very Adult nurse.

## 2023-12-19 ENCOUNTER — Ambulatory Visit

## 2023-12-19 ENCOUNTER — Telehealth: Admitting: Family Medicine

## 2023-12-19 ENCOUNTER — Telehealth: Payer: Self-pay

## 2023-12-19 DIAGNOSIS — I69198 Other sequelae of nontraumatic intracerebral hemorrhage: Secondary | ICD-10-CM | POA: Diagnosis not present

## 2023-12-19 DIAGNOSIS — R261 Paralytic gait: Secondary | ICD-10-CM | POA: Diagnosis not present

## 2023-12-19 DIAGNOSIS — U071 COVID-19: Secondary | ICD-10-CM

## 2023-12-19 MED ORDER — BENZONATATE 100 MG PO CAPS: 100.0000 mg | ORAL_CAPSULE | Freq: Two times a day (BID) | ORAL | 0 refills | Status: DC | PRN

## 2023-12-19 NOTE — Telephone Encounter (Signed)
 Pharmacy Patient Advocate Encounter  Received notification from [facility] MEDICAID that Prior Authorization for Lidocaine  5% patches  has been DENIED.  Full denial letter will be uploaded to the media tab. See denial reason below.   PA #/Case ID/Reference #: [id number]

## 2023-12-19 NOTE — Patient Instructions (Signed)
 COVID-19: What to Know COVID-19 is an infection caused by a virus called SARS-CoV-2. This type of virus is called a coronavirus. People with COVID-19 may: Have few to no symptoms. Have mild to moderate symptoms that affect their lungs and breathing. Get very sick. What are the causes?  COVID-19 is caused by a virus. This virus may be in the air as droplets or on surfaces. It can spread from an infected person when they cough, sneeze, speak, sing, or breathe. You may become infected if: You breathe in the infected droplets in the air. You touch an object that has the virus on it. What increases the risk? You are at risk of getting COVID-19 if you have been around someone with the infection. You may be more likely to get very sick if: You are 40 years old or older. You have certain medical conditions, such as: Heart disease. Diabetes. Long-term respiratory disease. Cancer. Pregnancy. You are immunocompromised. This means your body can't fight infections easily. You have a disability that makes it hard for you to move around, you have trouble moving, or you can't move at all. What are the signs or symptoms? People may have different symptoms from COVID-19. The symptoms can also be mild to very bad. They often show up in 5-6 days after being infected. But, they can take up to 14 days to appear. Common symptoms are: Cough. Feeling tired. New loss of taste or smell. Fever. Less common symptoms are: Sore throat. Headache. Body or muscle aches. Diarrhea. A skin rash or fingers or toes that are a different color than usual. Red or irritated eyes. Sometimes, COVID-19 does not cause symptoms. How is this diagnosed? COVID-19 can be diagnosed with tests done in the lab or at home. Fluid from your nose, mouth, or lungs will be used to check for the virus. How is this treated? Treatment for COVID-19 depends on how sick you are. Mild symptoms can be treated at home with rest, fluids, and  over-the-counter medicines. very bad symptoms may be treated in a hospital intensive care unit (ICU). If you have symptoms and are at risk of getting very sick, you may be given a medicine that fights viruses. This medicine is called an antiviral. How is this prevented? To protect yourself from COVID-19: Know your risk factors. Get vaccinated. If your body can't fight infections easily, talk to your provider about treatment to help prevent COVID-19. Stay at least about 3 feet (1 meter) away from other people. Wear mask that fits well when: You can't stay at a distance from people. You're in a place with not a lot of air flow. Try to be in open spaces with good air flow when you are in public. Wash your hands often or use an alcohol-based hand sanitizer. Cover your nose and mouth when you cough or sneeze. If you think you have COVID-19 or have been around someone who has it, stay home and away from other people as told by your provider or health officials. Where to find more information To learn more: Go to [employer]Prom[employer].no Click Health Topics. Type COVID-19 in the search box. Go to VisitDestination.com.br Click Health Topics. Then click All Topics. Type COVID-19 in the search box. Get help right away if: You have trouble breathing or get short of breath. You have pain or pressure in your chest. You're feeling confused. These symptoms may be an emergency. Get help right away. Call 911. Do not wait to see if the symptoms will go away.  Do not drive yourself to the hospital. This information is not intended to replace advice given to you by your health care provider. Make sure you discuss any questions you have with your health care provider. Document Revised: 01/02/2023 Document Reviewed: 12/25/2022 Elsevier Patient Education  2025 [employer].

## 2023-12-19 NOTE — Telephone Encounter (Signed)
 Spoke with patient wife advised that he  Can use Vicks Vapor rub under nose for congestion. Rxn written for Tessalon  Perles for cough suppressant was sent to pharmacy. Patient 's wife voiced that spoke with his transplant coor.  Instructed to avoid acetaminophen  or  Pseudoephedrine. Voiced that the patient has MD appointment today.

## 2023-12-19 NOTE — Progress Notes (Signed)
 Virtual Visit Consent   Jason Stevenson, you are scheduled for a virtual visit with a [facility] provider today. Just as with appointments in the office, your consent must be obtained to participate. Your consent will be active for this visit and any virtual visit you may have with one of our providers in the next 365 days. If you have a MyChart account, a copy of this consent can be sent to you electronically.  As this is a virtual visit, video technology does not allow for your provider to perform a traditional examination. This may limit your provider's ability to fully assess your condition. If your provider identifies any concerns that need to be evaluated in person or the need to arrange testing (such as labs, EKG, etc.), we will make arrangements to do so. Although advances in technology are sophisticated, we cannot ensure that it will always work on either your end or our end. If the connection with a video visit is poor, the visit may have to be switched to a telephone visit. With either a video or telephone visit, we are not always able to ensure that we have a secure connection.  By engaging in this virtual visit, you consent to the provision of healthcare and authorize for your insurance to be billed (if applicable) for the services provided during this visit. Depending on your insurance coverage, you may receive a charge related to this service.  I need to obtain your verbal consent now. Are you willing to proceed with your visit today? Jason Stevenson has provided verbal consent on 12/19/2023 for a virtual visit (video or telephone). Jason Lamp, FNP  Date: 12/19/2023 2:16 PM   Virtual Visit via Video Note   I, Jason Stevenson, connected with  Jason Stevenson  ([id number], Nov 12, 1985) on 12/19/23 at  2:15 PM EDT by a video-enabled telemedicine application and verified that I am speaking with the correct person using two identifiers.  Location: Patient: Virtual Visit Location Patient:  Home Provider: Virtual Visit Location Provider: Home Office   I discussed the limitations of evaluation and management by telemedicine and the availability of in person appointments. The patient expressed understanding and agreed to proceed.    History of Present Illness: Jason Stevenson is a 38 y.o. who identifies as a male who was assigned male at birth, and is being seen today for positive covid testing with runny nose, cough, hot and cold, slight wheezing on prednisone , he had a heart transplant in August 2024.   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   Poorly controlled type 2 diabetes mellitus with circulatory disorder (HCC) 03/19/2021   Ischemic cardiomyopathy    Syncope 12/14/2020   Chest pain 10/30/2020   History of cerebrovascular accident (CVA) with residual deficit 10/27/2020   Acute CVA (cerebrovascular accident) (HCC) 09/21/2020   Near syncope 09/21/2020   LV (left ventricular) mural thrombus 09/13/2020   Seizure (HCC) 09/13/2020   Neurological deficit present 09/12/2020   History of COVID-19 09/12/2020   Intracerebral hemorrhage 09/12/2020   Acute cerebrovascular accident (CVA) (HCC) 09/12/2020  COVID-19 08/23/2020   Noncompliance with medication treatment due to intermittent use of medication 10/12/2018   Dyslipidemia 04/17/2016   Chronic systolic CHF (congestive heart failure) (HCC) 06/18/2015   Needs flu shot 12/23/2013   Diabetes mellitus (HCC) 06/17/2013   Non-ischemic cardiomyopathy  (HCC) 11/06/2012   HTN (hypertension) 11/06/2012   HLD (hyperlipidemia) 11/06/2012    Allergies: No Known Allergies Medications:  Current Outpatient Medications:    ACCU-CHEK GUIDE test strip, USE TO CHECK BLOOD SUGAR THREE TIMES DAILY., Disp: 100 each, Rfl: 2   Accu-Chek Softclix Lancets lancets, Use 3 (three) times daily as directed to check blood sugar. May dispense any manufacturer covered by patient's insurance and fits patient's device., Disp: 100 each, Rfl: 0   acetaminophen  (TYLENOL ) 325 MG tablet, Take 1-2 tablets (325-650 mg total) by mouth every 4 (four) hours as needed for mild pain., Disp: , Rfl:    atorvastatin  (LIPITOR ) 10 MG tablet, Take 1 tablet (10 mg total) by mouth at bedtime., Disp: 30 tablet, Rfl: 0   benzonatate  (TESSALON ) 100 MG capsule, Take 1 capsule (100 mg total) by mouth 2 (two) times daily as needed for cough., Disp: 20 capsule, Rfl: 0   Blood Glucose Monitoring Suppl (ACCU-CHEK GUIDE ME) w/Device KIT, USE TO CHECK BLOOD SUGAR THREE TIMES DAILY., Disp: 1 kit, Rfl: 0   Blood Glucose Monitoring Suppl (BLOOD GLUCOSE MONITOR SYSTEM) w/Device KIT, Use 3 (three) times daily. May dispense any manufacturer covered by patient's insurance., Disp: 1 kit, Rfl: 0   Blood Pressure Monitor DEVI, Use as directed to check home blood pressure 2-3 times a week, Disp: 1 Device, Rfl: 0   calcium  acetate (PHOSLO ) 667 MG capsule, Take 2 capsules (1,334 mg total) by mouth in the morning and at bedtime., Disp: 90 capsule, Rfl: 0   carvedilol  (COREG ) 25 MG tablet, Take 1 tablet (25 mg total) by mouth 2 (two) times daily with a meal., Disp: 60 tablet, Rfl: 0   Continuous Glucose Sensor (FREESTYLE LIBRE 2 SENSOR) MISC, UAD, Disp: 6 each, Rfl: 1   diclofenac  Sodium (VOLTAREN ) 1 % GEL, Apply 2 g topically 4 (four) times daily as needed (pain)., Disp: 150 g, Rfl: 0   EASY COMFORT PEN NEEDLES 32G X 4 MM MISC, USE TO INJECT INSULIN  FOUR TIMES A DAY, Disp: 200 each, Rfl: 2   ezetimibe  (ZETIA ) 10 MG  tablet, Take 1 tablet (10 mg total) by mouth daily., Disp: 30 tablet, Rfl: 0   fluticasone (FLONASE) 50 MCG/ACT nasal spray, Place 1 spray into both nostrils as needed for allergies., Disp: , Rfl:    GENGRAF  25 MG capsule, Take 5 capsules (125mg ) by mouth in the morning and at bedtime., Disp: 300 capsule, Rfl: 0   GLUCAGON NA, Place 3 sprays into the nose once as needed., Disp: , Rfl:    Glucose Blood (BLOOD GLUCOSE TEST STRIPS) STRP, Use 3 (three) times daily as directed to check blood sugar. May dispense any manufacturer covered by patient's insurance and fits patient's device., Disp: 100 strip, Rfl: 0   insulin  glargine (LANTUS ) 100 UNIT/ML Solostar Pen, Inject 20 Units into the skin at bedtime., Disp: 15 mL, Rfl: 0   insulin  lispro (HUMALOG  KWIKPEN) 100 UNIT/ML KwikPen, Inject 14-16 Units into the skin 3 (three) times daily. 8 am,  noon and 6 pm with meals., Disp: 15 mL, Rfl: 11   Insulin  Pen Needle 32G X 4 MM MISC, Use as directed with insulin  pens., Disp: 100 each, Rfl: 0   Insulin  Pen Needle 32G  X 4 MM MISC, 1 each by Does not apply route 3 (three) times daily. May dispense any manufacturer covered by patient's insurance., Disp: 100 each, Rfl: 0   Insulin  Syringe-Needle U-100 (RELION INSULIN  SYRINGE) 31G X 15/64 0.3 ML MISC, Use to inject insulin  daily., Disp: 100 each, Rfl: 11   Lancet Device MISC, 1 each by Does not apply route 3 (three) times daily. May dispense any manufacturer covered by patient's insurance., Disp: 1 each, Rfl: 0   levETIRAcetam  (KEPPRA ) 750 MG tablet, Take 2 tablets (1,500 mg total) by mouth 2 (two) times daily., Disp: 120 tablet, Rfl: 0   lidocaine  (LIDODERM ) 5 %, Place 2 patches onto the skin daily. Remove & Discard patch within 12 hours or as directed by MD, Disp: 60 patch, Rfl: 0   losartan  (COZAAR ) 100 MG tablet, Take 1 tablet (100 mg total) by mouth daily., Disp: 30 tablet, Rfl: 0   magnesium  oxide (MAG-OX) 400 (240 Mg) MG tablet, Take 2 tablets (800 mg total) by  mouth 2 (two) times daily., Disp: 120 tablet, Rfl: 0   melatonin 3 MG TABS tablet, Take 1 tablet (3 mg total) by mouth at bedtime., Disp: 30 tablet, Rfl: 0   Oxcarbazepine  (TRILEPTAL ) 300 MG tablet, Take 1 tablet by mouth every morning and take 2 tablets by mouth before bedtime., Disp: 90 tablet, Rfl: 0   pantoprazole  (PROTONIX ) 40 MG tablet, Take 1 tablet (40 mg total) by mouth daily., Disp: 30 tablet, Rfl: 0   predniSONE  (DELTASONE ) 5 MG tablet, Take 1 tablet (5 mg total) by mouth daily., Disp: 30 tablet, Rfl: 0   pregabalin  (LYRICA ) 75 MG capsule, Take 1 capsule (75 mg total) by mouth at bedtime., Disp: 30 capsule, Rfl: 0   senna-docusate (SENOKOT-S) 8.6-50 MG tablet, Take 1-2 tablets by mouth 2 (two) times daily as needed for mild constipation. (Patient not taking: Reported on 11/24/2023), Disp: , Rfl:    traZODone  (DESYREL ) 50 MG tablet, Take 1 tablet (50 mg total) by mouth at bedtime as needed for sleep., Disp: 30 tablet, Rfl: 0   valGANciclovir  (VALCYTE ) 450 MG tablet, Take 2 tablets (900 mg total) by mouth daily with breakfast., Disp: 60 tablet, Rfl: 0  Observations/Objective: Patient is well-developed, well-nourished in no acute distress.  Resting comfortably  at home.  Head is normocephalic, atraumatic.  No labored breathing.  Speech is clear and coherent with logical content.  Patient is alert and oriented at baseline.    Assessment and Plan: 1. COVID-19 (Primary)  His wife is contacting his heart transplant team to check that meds discussed are acceptable and she will message me through his my chart.   Follow Up Instructions: I discussed the assessment and treatment plan with the patient. The patient was provided an opportunity to ask questions and all were answered. The patient agreed with the plan and demonstrated an understanding of the instructions.  A copy of instructions were sent to the patient via MyChart unless otherwise noted below.     The patient was advised to  call back or seek an in-person evaluation if the symptoms worsen or if the condition fails to improve as anticipated.    Torin Modica, FNP

## 2023-12-19 NOTE — Telephone Encounter (Signed)
 Copied from CRM #[id number]. Topic: Clinical - Home Health Verbal Orders >> Dec 19, 2023  3:48 PM Nathanel BROCKS wrote: Caller/Agency: Lenny Corean Rushing Number: [phone] Service Requested: Skilled Nursing Frequency: once a week for 3 weeks and then once every other week for 6 weeks Any new concerns about the patient? No

## 2023-12-20 ENCOUNTER — Telehealth: Admitting: Family

## 2023-12-20 DIAGNOSIS — U071 COVID-19: Secondary | ICD-10-CM

## 2023-12-20 MED ORDER — MOLNUPIRAVIR EUA 200MG CAPSULE
4.0000 | ORAL_CAPSULE | Freq: Two times a day (BID) | ORAL | 0 refills | Status: AC
Start: 2023-12-20 — End: 2023-12-25

## 2023-12-20 MED ORDER — BENZONATATE 100 MG PO CAPS
100.0000 mg | ORAL_CAPSULE | Freq: Two times a day (BID) | ORAL | 0 refills | Status: DC | PRN
Start: 2023-12-20 — End: 2024-01-27

## 2023-12-20 NOTE — Progress Notes (Signed)
 PT had a visit yesterday for COVID. He had a heart transplant in 2024 and the provider wanted the patient to clear that from his transplant team. They stated it was safe for him to start the antivirals. I have sent in  Meds ordered this encounter  Medications   molnupiravir  EUA (LAGEVRIO ) 200 mg CAPS capsule    Sig: Take 4 capsules (800 mg total) by mouth 2 (two) times daily for 5 days.    Dispense:  40 capsule    Refill:  0    Supervising Provider:   LAMPTEY, PHILIP O [id number]   benzonatate  (TESSALON ) 100 MG capsule    Sig: Take 1 capsule (100 mg total) by mouth 2 (two) times daily as needed for cough.    Dispense:  20 capsule    Refill:  0    Supervising Provider:   BLAISE ALEENE KIDD [id number]   Rest, force fluids, tylenol  as needed, report any worsening symptoms such as increased shortness of breath, swelling, or continued high fevers. Possible adverse effects discussed with antivirals.    Bari Learn, FNP

## 2023-12-23 ENCOUNTER — Telehealth: Payer: Self-pay

## 2023-12-23 NOTE — Telephone Encounter (Signed)
 Duplicate message

## 2023-12-23 NOTE — Telephone Encounter (Signed)
 I spoke to [facility] and explained to her that the patient has not seen his PCP, Dr Vicci, since 03/2023.  He is scheduled to be seen Dr Delbert, another provider at [facility], on 12/30/2023 and he will need to keep that appointment to have the orders signed,  Corean said she understood and will remind the patient/ family of the appt.

## 2023-12-23 NOTE — Telephone Encounter (Signed)
 Copied from CRM [id number]. Topic: General - Call Back - No Documentation >> Dec 23, 2023  9:56 AM Precious C wrote: Reason for CRM: Corean from [facility] called to check on the status of the verbal order she submitted on September 5. She is requesting if the order can be expedited and marked as high priority. She would like confirmation once this is addressed. Callback number: [phone].

## 2023-12-26 ENCOUNTER — Telehealth: Payer: Self-pay

## 2023-12-26 NOTE — Telephone Encounter (Signed)
 Patient has not been seen recently, he has an upcoming appointment verbal order will be given after appointment.    Copied from CRM [id number]. Topic: Clinical - Home Health Verbal Orders >> Dec 24, 2023  1:22 PM Wess RAMAN wrote: Caller/Agency: [facility] Callback Number: [phone] Service Requested: Physical Therapy Frequency: 1 week 1, 2 week 7 Any new concerns about the patient? Yes, add a social work eval

## 2023-12-29 DIAGNOSIS — G4089 Other seizures: Secondary | ICD-10-CM | POA: Diagnosis not present

## 2023-12-29 DIAGNOSIS — D649 Anemia, unspecified: Secondary | ICD-10-CM | POA: Diagnosis not present

## 2023-12-29 DIAGNOSIS — Z941 Heart transplant status: Secondary | ICD-10-CM | POA: Diagnosis not present

## 2023-12-29 DIAGNOSIS — Z794 Long term (current) use of insulin: Secondary | ICD-10-CM | POA: Diagnosis not present

## 2023-12-29 DIAGNOSIS — Z7952 Long term (current) use of systemic steroids: Secondary | ICD-10-CM | POA: Diagnosis not present

## 2023-12-29 DIAGNOSIS — Z6826 Body mass index (BMI) 26.0-26.9, adult: Secondary | ICD-10-CM | POA: Diagnosis not present

## 2023-12-29 DIAGNOSIS — E119 Type 2 diabetes mellitus without complications: Secondary | ICD-10-CM | POA: Diagnosis not present

## 2023-12-29 DIAGNOSIS — D849 Immunodeficiency, unspecified: Secondary | ICD-10-CM | POA: Diagnosis not present

## 2023-12-29 DIAGNOSIS — Z8616 Personal history of COVID-19: Secondary | ICD-10-CM | POA: Diagnosis not present

## 2023-12-29 DIAGNOSIS — I69118 Other symptoms and signs involving cognitive functions following nontraumatic intracerebral hemorrhage: Secondary | ICD-10-CM | POA: Diagnosis not present

## 2023-12-29 DIAGNOSIS — F320 Major depressive disorder, single episode, mild: Secondary | ICD-10-CM | POA: Diagnosis not present

## 2023-12-29 DIAGNOSIS — E785 Hyperlipidemia, unspecified: Secondary | ICD-10-CM | POA: Diagnosis not present

## 2023-12-29 DIAGNOSIS — E43 Unspecified severe protein-calorie malnutrition: Secondary | ICD-10-CM | POA: Diagnosis not present

## 2023-12-29 DIAGNOSIS — I10 Essential (primary) hypertension: Secondary | ICD-10-CM | POA: Diagnosis not present

## 2023-12-29 DIAGNOSIS — G629 Polyneuropathy, unspecified: Secondary | ICD-10-CM | POA: Diagnosis not present

## 2023-12-30 ENCOUNTER — Ambulatory Visit: Attending: Family Medicine | Admitting: Family Medicine

## 2023-12-30 ENCOUNTER — Encounter: Payer: Self-pay | Admitting: Family Medicine

## 2023-12-30 VITALS — BP 137/86 | HR 88

## 2023-12-30 DIAGNOSIS — M79632 Pain in left forearm: Secondary | ICD-10-CM

## 2023-12-30 DIAGNOSIS — E139 Other specified diabetes mellitus without complications: Secondary | ICD-10-CM

## 2023-12-30 DIAGNOSIS — Z8616 Personal history of COVID-19: Secondary | ICD-10-CM | POA: Diagnosis not present

## 2023-12-30 DIAGNOSIS — Z941 Heart transplant status: Secondary | ICD-10-CM

## 2023-12-30 DIAGNOSIS — E785 Hyperlipidemia, unspecified: Secondary | ICD-10-CM | POA: Diagnosis not present

## 2023-12-30 DIAGNOSIS — G4089 Other seizures: Secondary | ICD-10-CM | POA: Diagnosis not present

## 2023-12-30 DIAGNOSIS — I69119 Unspecified symptoms and signs involving cognitive functions following nontraumatic intracerebral hemorrhage: Secondary | ICD-10-CM | POA: Diagnosis not present

## 2023-12-30 DIAGNOSIS — Z09 Encounter for follow-up examination after completed treatment for conditions other than malignant neoplasm: Secondary | ICD-10-CM

## 2023-12-30 DIAGNOSIS — F320 Major depressive disorder, single episode, mild: Secondary | ICD-10-CM | POA: Diagnosis not present

## 2023-12-30 DIAGNOSIS — I69354 Hemiplegia and hemiparesis following cerebral infarction affecting left non-dominant side: Secondary | ICD-10-CM | POA: Diagnosis not present

## 2023-12-30 DIAGNOSIS — D649 Anemia, unspecified: Secondary | ICD-10-CM | POA: Diagnosis not present

## 2023-12-30 DIAGNOSIS — E43 Unspecified severe protein-calorie malnutrition: Secondary | ICD-10-CM | POA: Diagnosis not present

## 2023-12-30 DIAGNOSIS — Z7952 Long term (current) use of systemic steroids: Secondary | ICD-10-CM | POA: Diagnosis not present

## 2023-12-30 DIAGNOSIS — I69118 Other symptoms and signs involving cognitive functions following nontraumatic intracerebral hemorrhage: Secondary | ICD-10-CM | POA: Diagnosis not present

## 2023-12-30 DIAGNOSIS — D849 Immunodeficiency, unspecified: Secondary | ICD-10-CM | POA: Diagnosis not present

## 2023-12-30 DIAGNOSIS — Z794 Long term (current) use of insulin: Secondary | ICD-10-CM | POA: Diagnosis not present

## 2023-12-30 DIAGNOSIS — E119 Type 2 diabetes mellitus without complications: Secondary | ICD-10-CM | POA: Diagnosis not present

## 2023-12-30 DIAGNOSIS — Z6826 Body mass index (BMI) 26.0-26.9, adult: Secondary | ICD-10-CM | POA: Diagnosis not present

## 2023-12-30 DIAGNOSIS — G629 Polyneuropathy, unspecified: Secondary | ICD-10-CM | POA: Diagnosis not present

## 2023-12-30 DIAGNOSIS — I10 Essential (primary) hypertension: Secondary | ICD-10-CM | POA: Diagnosis not present

## 2023-12-30 MED ORDER — DULOXETINE HCL 60 MG PO CPEP: 60.0000 mg | ORAL_CAPSULE | Freq: Every day | ORAL | 1 refills | Status: DC

## 2023-12-30 NOTE — Progress Notes (Signed)
 Subjective:  Patient ID: Jason Stevenson, male    DOB: 04/07/1986  Age: 38 y.o. MRN: [id number]  CC: Hospitalization Follow-up (Left arm pain/)     Discussed the use of AI scribe software for clinical note transcription with the patient, who gave verbal consent to proceed.  History of Present Illness Jason Stevenson is a 38 year old male with a history of basal ganglia stroke and cardiac transplant who presents for a hospital follow-up. He is accompanied by his family.  In April 2025, he experienced a basal ganglia stroke, resulting in cognitive deficits, particularly affecting short-term memory, and left-sided weakness. He is undergoing physical therapy to improve his gait and uses a heavy walker for mobility. Weakness is predominantly on the left side, and he has a wheelchair at home.  He experiences left forearm pain described as sharp and sometimes 'nice pain,' often waking him from sleep. The pain is accompanied by numbness and is rated between mild and moderate. He also experiences swelling in the left hand and occasional shoulder pain. He takes Lyrica  75 mg twice a day for relief, though it is not very effective. He is undergoing outpatient physical therapy with [facility], which includes home health therapy.  He has a cardiac transplant performed last year at [facility] and is under regular follow-up with the cardiac transplant team. His diabetes is managed with an A1c of 7.2 as of November 24, 2023.    Past Medical History:  Diagnosis Date  . Chronic systolic CHF (congestive heart failure) (HCC) 06/18/2015  . Heart transplant recipient [facility])   . Hemorrhagic stroke (HCC)   . Hyperlipidemia   . Hypertension   . Immune deficiency disorder (HCC)   . Neuropathy   . Nonischemic cardiomyopathy (HCC) Noted as early as 2007   Per chart review (cards consult note 2011), EF of 40% in 2007, down to 20-25% in 2011  . Seizures (HCC)   . Stroke (cerebrum) (HCC)   . Type 2 diabetes mellitus  (HCC)     Past Surgical History:  Procedure Laterality Date  . ICD IMPLANT N/A 08/27/2021   Procedure: ICD IMPLANT;  Surgeon: Cindie Ole DASEN, MD;  Location: [facility] INVASIVE CV LAB;  Service: Cardiovascular;  Laterality: N/A;  . None    . RIGHT HEART CATH  06/21/2021   [facility]  . RIGHT HEART CATH N/A 05/17/2022   Procedure: RIGHT HEART CATH;  Surgeon: Rolan Ezra RAMAN, MD;  Location: [facility] INVASIVE CV LAB;  Service: Cardiovascular;  Laterality: N/A;  . RIGHT/LEFT HEART CATH AND CORONARY ANGIOGRAPHY N/A 11/02/2020   Procedure: RIGHT/LEFT HEART CATH AND CORONARY ANGIOGRAPHY;  Surgeon: Rolan Ezra RAMAN, MD;  Location: [facility] INVASIVE CV LAB;  Service: Cardiovascular;  Laterality: N/A;    Family History  Problem Relation Age of Onset  . Stroke Mother   . Diabetes Mother   . Hypertension Mother   . Stroke Maternal Aunt   . Heart attack Neg Hx     Social History   Socioeconomic History  . Marital status: Married    Spouse name: [name]  . Number of children: 0  . Years of education: 55  . Highest education level: Some college, no degree  Occupational History  . Occupation: unemployed  Tobacco Use  . Smoking status: Never  . Smokeless tobacco: Never  Vaping Use  . Vaping status: Not on file  Substance and Sexual Activity  . Alcohol use: Not Currently    Comment: occasional when hanging out with the wrong people No  recent use.  . Drug use: Not Currently    Types: Marijuana    Comment: occasional, last 2013  . Sexual activity: Not Currently  Other Topics Concern  . Not on file  Social History Narrative   Pt lives at home with his wife    Right handed    Caffeine- hardly any   Social Drivers of Health   Financial Resource Strain: Low Risk  (10/01/2023)   Received from [facility]   Overall Financial Resource Strain (CARDIA)   . Difficulty of Paying Living Expenses: Not very hard  Food Insecurity: Food Insecurity Present (10/01/2023)   Received from  [facility]   Hunger Vital Sign   . Within the past 12 months, you worried that your food would run out before you got the money to buy more.: Sometimes true   . Within the past 12 months, the food you bought just didn't last and you didn't have money to get more.: Never true  Transportation Needs: No Transportation Needs (06/30/2023)   Received from [facility]   [facility] - Transportation   . In the past 12 months, has lack of transportation kept you from medical appointments or from getting medications?: No   . Lack of Transportation (Non-Medical): No  Physical Activity: Inactive (03/20/2023)   Exercise Vital Sign   . Days of Exercise per Week: 0 days   . Minutes of Exercise per Session: 0 min  Stress: Stress Concern Present (03/31/2023)   [employer] [facility] Occupational [employer] - Occupational Stress Questionnaire   . Feeling of Stress : To some extent  Social Connections: Moderately Integrated (03/20/2023)   Social Connection and Isolation Panel   . Frequency of Communication with Friends and Family: Once a week   . Frequency of Social Gatherings with Friends and Family: Never   . Attends Religious Services: More than 4 times per year   . Active Member of Clubs or Organizations: Yes   . Attends Banker Meetings: More than 4 times per year   . Marital Status: Married    No Known Allergies  Outpatient Medications Prior to Visit  Medication Sig Dispense Refill  . ACCU-CHEK GUIDE test strip USE TO CHECK BLOOD SUGAR THREE TIMES DAILY. 100 each 2  . Accu-Chek Softclix Lancets lancets Use 3 (three) times daily as directed to check blood sugar. May dispense any manufacturer covered by patient's insurance and fits patient's device. 100 each 0  . acetaminophen  (TYLENOL ) 325 MG tablet Take 1-2 tablets (325-650 mg total) by mouth every 4 (four) hours as needed for mild pain.    . atorvastatin  (LIPITOR ) 10 MG tablet Take 1 tablet (10 mg total) by  mouth at bedtime. 30 tablet 0  . benzonatate  (TESSALON ) 100 MG capsule Take 1 capsule (100 mg total) by mouth 2 (two) times daily as needed for cough. 20 capsule 0  . Blood Glucose Monitoring Suppl (ACCU-CHEK GUIDE ME) w/Device KIT USE TO CHECK BLOOD SUGAR THREE TIMES DAILY. 1 kit 0  . Blood Glucose Monitoring Suppl (BLOOD GLUCOSE MONITOR SYSTEM) w/Device KIT Use 3 (three) times daily. May dispense any manufacturer covered by patient's insurance. 1 kit 0  . Blood Pressure Monitor DEVI Use as directed to check home blood pressure 2-3 times a week 1 Device 0  . calcium  acetate (PHOSLO ) 667 MG capsule Take 2 capsules (1,334 mg total) by mouth in the morning and at bedtime. 90 capsule 0  . carvedilol  (COREG ) 25 MG  tablet Take 1 tablet (25 mg total) by mouth 2 (two) times daily with a meal. 60 tablet 0  . Continuous Glucose Sensor (FREESTYLE LIBRE 2 SENSOR) MISC UAD 6 each 1  . diclofenac  Sodium (VOLTAREN ) 1 % GEL Apply 2 g topically 4 (four) times daily as needed (pain). 150 g 0  . EASY COMFORT PEN NEEDLES 32G X 4 MM MISC USE TO INJECT INSULIN  FOUR TIMES A DAY 200 each 2  . ezetimibe  (ZETIA ) 10 MG tablet Take 1 tablet (10 mg total) by mouth daily. 30 tablet 0  . fluticasone (FLONASE) 50 MCG/ACT nasal spray Place 1 spray into both nostrils as needed for allergies.    . GENGRAF  25 MG capsule Take 5 capsules (125mg ) by mouth in the morning and at bedtime. 300 capsule 0  . GLUCAGON NA Place 3 sprays into the nose once as needed.    . Glucose Blood (BLOOD GLUCOSE TEST STRIPS) STRP Use 3 (three) times daily as directed to check blood sugar. May dispense any manufacturer covered by patient's insurance and fits patient's device. 100 strip 0  . insulin  glargine (LANTUS ) 100 UNIT/ML Solostar Pen Inject 20 Units into the skin at bedtime. 15 mL 0  . insulin  lispro (HUMALOG  KWIKPEN) 100 UNIT/ML KwikPen Inject 14-16 Units into the skin 3 (three) times daily. 8 am,  noon and 6 pm with meals. 15 mL 11  . Insulin  Pen  Needle 32G X 4 MM MISC Use as directed with insulin  pens. 100 each 0  . Insulin  Pen Needle 32G X 4 MM MISC 1 each by Does not apply route 3 (three) times daily. May dispense any manufacturer covered by patient's insurance. 100 each 0  . Insulin  Syringe-Needle U-100 (RELION INSULIN  SYRINGE) 31G X 15/64 0.3 ML MISC Use to inject insulin  daily. 100 each 11  . Lancet Device MISC 1 each by Does not apply route 3 (three) times daily. May dispense any manufacturer covered by patient's insurance. 1 each 0  . levETIRAcetam  (KEPPRA ) 750 MG tablet Take 2 tablets (1,500 mg total) by mouth 2 (two) times daily. 120 tablet 0  . lidocaine  (LIDODERM ) 5 % Place 2 patches onto the skin daily. Remove & Discard patch within 12 hours or as directed by MD 60 patch 0  . losartan  (COZAAR ) 100 MG tablet Take 1 tablet (100 mg total) by mouth daily. 30 tablet 0  . magnesium  oxide (MAG-OX) 400 (240 Mg) MG tablet Take 2 tablets (800 mg total) by mouth 2 (two) times daily. 120 tablet 0  . melatonin 3 MG TABS tablet Take 1 tablet (3 mg total) by mouth at bedtime. 30 tablet 0  . Oxcarbazepine  (TRILEPTAL ) 300 MG tablet Take 1 tablet by mouth every morning and take 2 tablets by mouth before bedtime. 90 tablet 0  . pantoprazole  (PROTONIX ) 40 MG tablet Take 1 tablet (40 mg total) by mouth daily. 30 tablet 0  . predniSONE  (DELTASONE ) 5 MG tablet Take 1 tablet (5 mg total) by mouth daily. 30 tablet 0  . pregabalin  (LYRICA ) 75 MG capsule Take 1 capsule (75 mg total) by mouth at bedtime. 30 capsule 0  . senna-docusate (SENOKOT-S) 8.6-50 MG tablet Take 1-2 tablets by mouth 2 (two) times daily as needed for mild constipation.    . traZODone  (DESYREL ) 50 MG tablet Take 1 tablet (50 mg total) by mouth at bedtime as needed for sleep. 30 tablet 0  . valGANciclovir  (VALCYTE ) 450 MG tablet Take 2 tablets (900 mg total) by mouth daily with  breakfast. 60 tablet 0   No facility-administered medications prior to visit.     ROS Review of Systems   Constitutional:  Negative for activity change and appetite change.  HENT:  Negative for sinus pressure and sore throat.   Respiratory:  Negative for chest tightness, shortness of breath and wheezing.   Cardiovascular:  Negative for chest pain and palpitations.  Gastrointestinal:  Negative for abdominal distention, abdominal pain and constipation.  Genitourinary: Negative.   Musculoskeletal:        See HPI  Psychiatric/Behavioral:  Negative for behavioral problems and dysphoric mood.    *** Objective:  BP 137/86   Pulse 88   SpO2 99%      12/30/2023   10:14 AM 12/16/2023    4:32 AM 12/15/2023    9:18 PM  BP/Weight  Systolic BP 137 115 139  Diastolic BP 86 78 84  Wt. (Lbs)  197.31   BMI  27.52 kg/m2       Physical Exam ***    Latest Ref Rng & Units 12/15/2023    5:49 AM 12/11/2023    4:55 AM 12/08/2023    5:01 AM  CMP  Glucose 70 - 99 mg/dL 677  793  826   BUN 6 - 20 mg/dL 23  17  16    Creatinine 0.61 - 1.24 mg/dL 8.80  8.99  9.04   Sodium 135 - 145 mmol/L 138  141  138   Potassium 3.5 - 5.1 mmol/L 4.4  4.0  3.8   Chloride 98 - 111 mmol/L 102  106  107   CO2 22 - 32 mmol/L 26  26  25    Calcium  8.9 - 10.3 mg/dL 8.9  9.2  9.1     Lipid Panel     Component Value Date/Time   CHOL 175 04/30/2022 1106   CHOL 107 06/08/2021 0851   TRIG 82 04/30/2022 1106   HDL 61 04/30/2022 1106   HDL 49 06/08/2021 0851   CHOLHDL 2.9 04/30/2022 1106   VLDL 16 04/30/2022 1106   LDLCALC 98 04/30/2022 1106   LDLCALC 31 06/08/2021 0851    CBC    Component Value Date/Time   WBC 4.9 12/15/2023 0549   RBC 3.49 (L) 12/15/2023 0549   HGB 9.9 (L) 12/15/2023 0549   HGB 12.8 (L) 10/25/2022 1012   HCT 30.9 (L) 12/15/2023 0549   HCT 39.1 10/25/2022 1012   PLT 224 12/15/2023 0549   PLT 195 10/25/2022 1012   MCV 88.5 12/15/2023 0549   MCV 88 10/25/2022 1012   MCH 28.4 12/15/2023 0549   MCHC 32.0 12/15/2023 0549   RDW 15.3 12/15/2023 0549   RDW 12.2 10/25/2022 1012   LYMPHSABS 2.3  11/25/2023 0443   LYMPHSABS 2.0 08/03/2021 1313   MONOABS 0.3 11/25/2023 0443   EOSABS 0.1 11/25/2023 0443   EOSABS 0.1 08/03/2021 1313   BASOSABS 0.2 (H) 11/25/2023 0443   BASOSABS 0.1 08/03/2021 1313    Lab Results  Component Value Date   HGBA1C 7.2 (H) 11/24/2023      There are no diagnoses linked to this encounter.   Healthcare maintenance ***  No orders of the defined types were placed in this encounter.   Follow-up: No follow-ups on file.       Corrina Sabin, MD, FAAFP. [facility] [place], [place] [phone]   12/30/2023, 10:19 AM

## 2023-12-30 NOTE — Patient Instructions (Signed)
 VISIT SUMMARY:  You had a follow-up appointment today to review your health status after your hospital stay. We discussed your recovery from the basal ganglia stroke, your cardiac transplant, and your diabetes management. We also addressed the pain and numbness in your left arm and shoulder.  YOUR PLAN:  -CARDIAC TRANSPLANT RECIPIENT, POST-TRANSPLANT CARE: You are continuing your recovery after your heart transplant. It is important to keep up with your follow-up appointments at [facility] and ensure you have all your medications, especially the ones that prevent your body from rejecting the new heart.  -LEFT-SIDED HEMIPARESIS AND COGNITIVE DEFICITS FOLLOWING BASAL GANGLIA STROKE: You are experiencing weakness on your left side and some memory issues due to your stroke in April. Continue with your physical therapy sessions at [facility] to help improve your mobility and strength.  -LEFT UPPER EXTREMITY PAIN AND NUMBNESS, POST-STROKE: You have pain and numbness in your left arm and shoulder, which may be related to your stroke. We will start you on Cymbalta  to help with the pain and numbness, and you should continue taking Lyrica . An x-ray of your left forearm will be done to check for any fractures.  -TYPE 2 DIABETES MELLITUS, CONTROLLED: Your diabetes is well-managed with an A1c of 7.2. Keep up with your current diabetes care routine to maintain good control.  INSTRUCTIONS:  Please continue your follow-up with the cardiac transplant team at [facility]. Make sure to get your physical therapy orders signed by Doctor Vicci. We will also need to schedule an x-ray for your left forearm. Continue taking your medications as prescribed and attend all your therapy sessions.

## 2023-12-31 ENCOUNTER — Encounter: Payer: Self-pay | Admitting: Family Medicine

## 2023-12-31 ENCOUNTER — Telehealth (HOSPITAL_BASED_OUTPATIENT_CLINIC_OR_DEPARTMENT_OTHER): Admitting: Family Medicine

## 2023-12-31 DIAGNOSIS — Z7952 Long term (current) use of systemic steroids: Secondary | ICD-10-CM | POA: Diagnosis not present

## 2023-12-31 NOTE — Telephone Encounter (Signed)
Verbal order given for patient.

## 2023-12-31 NOTE — Telephone Encounter (Signed)
 Error

## 2023-12-31 NOTE — Telephone Encounter (Signed)
 Jason Stevenson with [facility] calling to follow up on orders request.  Requesting callback: [phone]

## 2024-01-01 DIAGNOSIS — E43 Unspecified severe protein-calorie malnutrition: Secondary | ICD-10-CM | POA: Diagnosis not present

## 2024-01-01 DIAGNOSIS — F320 Major depressive disorder, single episode, mild: Secondary | ICD-10-CM | POA: Diagnosis not present

## 2024-01-01 DIAGNOSIS — I10 Essential (primary) hypertension: Secondary | ICD-10-CM | POA: Diagnosis not present

## 2024-01-01 DIAGNOSIS — Z6826 Body mass index (BMI) 26.0-26.9, adult: Secondary | ICD-10-CM | POA: Diagnosis not present

## 2024-01-01 DIAGNOSIS — Z7952 Long term (current) use of systemic steroids: Secondary | ICD-10-CM | POA: Diagnosis not present

## 2024-01-01 DIAGNOSIS — Z8616 Personal history of COVID-19: Secondary | ICD-10-CM | POA: Diagnosis not present

## 2024-01-01 DIAGNOSIS — E785 Hyperlipidemia, unspecified: Secondary | ICD-10-CM | POA: Diagnosis not present

## 2024-01-01 DIAGNOSIS — I69118 Other symptoms and signs involving cognitive functions following nontraumatic intracerebral hemorrhage: Secondary | ICD-10-CM | POA: Diagnosis not present

## 2024-01-01 DIAGNOSIS — D649 Anemia, unspecified: Secondary | ICD-10-CM | POA: Diagnosis not present

## 2024-01-01 DIAGNOSIS — E119 Type 2 diabetes mellitus without complications: Secondary | ICD-10-CM | POA: Diagnosis not present

## 2024-01-01 DIAGNOSIS — D849 Immunodeficiency, unspecified: Secondary | ICD-10-CM | POA: Diagnosis not present

## 2024-01-01 DIAGNOSIS — G629 Polyneuropathy, unspecified: Secondary | ICD-10-CM | POA: Diagnosis not present

## 2024-01-01 DIAGNOSIS — G4089 Other seizures: Secondary | ICD-10-CM | POA: Diagnosis not present

## 2024-01-01 DIAGNOSIS — Z794 Long term (current) use of insulin: Secondary | ICD-10-CM | POA: Diagnosis not present

## 2024-01-01 DIAGNOSIS — Z941 Heart transplant status: Secondary | ICD-10-CM | POA: Diagnosis not present

## 2024-01-02 NOTE — Telephone Encounter (Signed)
Verbal orders given for patient.

## 2024-01-02 NOTE — Telephone Encounter (Unsigned)
 Copied from CRM [id number]. Topic: Clinical - Home Health Verbal Orders >> Dec 31, 2023  3:38 PM Delon DASEN wrote: Caller/Agency: Kristine Glatter with [facility] Callback Number: [phone] secure Service Requested: Speech Therapy Frequency: 1x wk for 8 wks Any new concerns about the patient? No

## 2024-01-05 DIAGNOSIS — R600 Localized edema: Secondary | ICD-10-CM | POA: Diagnosis not present

## 2024-01-05 DIAGNOSIS — Z941 Heart transplant status: Secondary | ICD-10-CM | POA: Diagnosis not present

## 2024-01-05 DIAGNOSIS — Z79899 Other long term (current) drug therapy: Secondary | ICD-10-CM | POA: Diagnosis not present

## 2024-01-05 DIAGNOSIS — Z2989 Encounter for other specified prophylactic measures: Secondary | ICD-10-CM | POA: Diagnosis not present

## 2024-01-05 DIAGNOSIS — Z48298 Encounter for aftercare following other organ transplant: Secondary | ICD-10-CM | POA: Diagnosis not present

## 2024-01-05 DIAGNOSIS — D849 Immunodeficiency, unspecified: Secondary | ICD-10-CM | POA: Diagnosis not present

## 2024-01-05 DIAGNOSIS — I10 Essential (primary) hypertension: Secondary | ICD-10-CM | POA: Diagnosis not present

## 2024-01-05 DIAGNOSIS — Z23 Encounter for immunization: Secondary | ICD-10-CM | POA: Diagnosis not present

## 2024-01-07 ENCOUNTER — Other Ambulatory Visit: Payer: Self-pay | Admitting: Internal Medicine

## 2024-01-07 ENCOUNTER — Encounter: Payer: Self-pay | Admitting: Internal Medicine

## 2024-01-07 DIAGNOSIS — Z6826 Body mass index (BMI) 26.0-26.9, adult: Secondary | ICD-10-CM | POA: Diagnosis not present

## 2024-01-07 DIAGNOSIS — E785 Hyperlipidemia, unspecified: Secondary | ICD-10-CM | POA: Diagnosis not present

## 2024-01-07 DIAGNOSIS — F320 Major depressive disorder, single episode, mild: Secondary | ICD-10-CM | POA: Diagnosis not present

## 2024-01-07 DIAGNOSIS — I10 Essential (primary) hypertension: Secondary | ICD-10-CM | POA: Diagnosis not present

## 2024-01-07 DIAGNOSIS — Z8616 Personal history of COVID-19: Secondary | ICD-10-CM | POA: Diagnosis not present

## 2024-01-07 DIAGNOSIS — D849 Immunodeficiency, unspecified: Secondary | ICD-10-CM | POA: Diagnosis not present

## 2024-01-07 DIAGNOSIS — G4089 Other seizures: Secondary | ICD-10-CM | POA: Diagnosis not present

## 2024-01-07 DIAGNOSIS — G629 Polyneuropathy, unspecified: Secondary | ICD-10-CM | POA: Diagnosis not present

## 2024-01-07 DIAGNOSIS — D649 Anemia, unspecified: Secondary | ICD-10-CM | POA: Diagnosis not present

## 2024-01-07 DIAGNOSIS — Z794 Long term (current) use of insulin: Secondary | ICD-10-CM | POA: Diagnosis not present

## 2024-01-07 DIAGNOSIS — Z7952 Long term (current) use of systemic steroids: Secondary | ICD-10-CM | POA: Diagnosis not present

## 2024-01-07 DIAGNOSIS — Z941 Heart transplant status: Secondary | ICD-10-CM | POA: Diagnosis not present

## 2024-01-07 DIAGNOSIS — E43 Unspecified severe protein-calorie malnutrition: Secondary | ICD-10-CM | POA: Diagnosis not present

## 2024-01-07 DIAGNOSIS — E119 Type 2 diabetes mellitus without complications: Secondary | ICD-10-CM | POA: Diagnosis not present

## 2024-01-07 MED ORDER — EZETIMIBE 10 MG PO TABS: 10.0000 mg | ORAL_TABLET | Freq: Every day | ORAL | 1 refills | Status: AC

## 2024-01-07 MED ORDER — ATORVASTATIN CALCIUM 10 MG PO TABS: 10.0000 mg | ORAL_TABLET | Freq: Every day | ORAL | 1 refills | Status: DC

## 2024-01-07 MED ORDER — INSULIN GLARGINE 100 UNIT/ML SOLOSTAR PEN: 20.0000 [IU] | PEN_INJECTOR | Freq: Every day | SUBCUTANEOUS | 5 refills | Status: DC

## 2024-01-07 MED ORDER — PREGABALIN 75 MG PO CAPS: 75.0000 mg | ORAL_CAPSULE | Freq: Every day | ORAL | 1 refills | Status: DC

## 2024-01-07 MED ORDER — LEVETIRACETAM 750 MG PO TABS: 1500.0000 mg | ORAL_TABLET | Freq: Two times a day (BID) | ORAL | 1 refills | Status: AC

## 2024-01-07 MED ORDER — LOSARTAN POTASSIUM 100 MG PO TABS: 100.0000 mg | ORAL_TABLET | Freq: Every day | ORAL | 1 refills | Status: AC

## 2024-01-07 MED ORDER — TRAZODONE HCL 50 MG PO TABS: 50.0000 mg | ORAL_TABLET | Freq: Every evening | ORAL | 4 refills | Status: AC | PRN

## 2024-01-07 MED ORDER — OXCARBAZEPINE 300 MG PO TABS: ORAL_TABLET | ORAL | 0 refills | Status: AC

## 2024-01-08 ENCOUNTER — Telehealth: Payer: Self-pay | Admitting: Internal Medicine

## 2024-01-08 DIAGNOSIS — Z6826 Body mass index (BMI) 26.0-26.9, adult: Secondary | ICD-10-CM | POA: Diagnosis not present

## 2024-01-08 DIAGNOSIS — E43 Unspecified severe protein-calorie malnutrition: Secondary | ICD-10-CM | POA: Diagnosis not present

## 2024-01-08 DIAGNOSIS — F320 Major depressive disorder, single episode, mild: Secondary | ICD-10-CM | POA: Diagnosis not present

## 2024-01-08 DIAGNOSIS — Z7952 Long term (current) use of systemic steroids: Secondary | ICD-10-CM | POA: Diagnosis not present

## 2024-01-08 DIAGNOSIS — Z794 Long term (current) use of insulin: Secondary | ICD-10-CM | POA: Diagnosis not present

## 2024-01-08 DIAGNOSIS — I10 Essential (primary) hypertension: Secondary | ICD-10-CM | POA: Diagnosis not present

## 2024-01-08 DIAGNOSIS — Z8616 Personal history of COVID-19: Secondary | ICD-10-CM | POA: Diagnosis not present

## 2024-01-08 DIAGNOSIS — I69118 Other symptoms and signs involving cognitive functions following nontraumatic intracerebral hemorrhage: Secondary | ICD-10-CM | POA: Diagnosis not present

## 2024-01-08 DIAGNOSIS — D649 Anemia, unspecified: Secondary | ICD-10-CM | POA: Diagnosis not present

## 2024-01-08 DIAGNOSIS — G4089 Other seizures: Secondary | ICD-10-CM | POA: Diagnosis not present

## 2024-01-08 DIAGNOSIS — G629 Polyneuropathy, unspecified: Secondary | ICD-10-CM | POA: Diagnosis not present

## 2024-01-08 DIAGNOSIS — E785 Hyperlipidemia, unspecified: Secondary | ICD-10-CM | POA: Diagnosis not present

## 2024-01-08 DIAGNOSIS — E119 Type 2 diabetes mellitus without complications: Secondary | ICD-10-CM | POA: Diagnosis not present

## 2024-01-08 DIAGNOSIS — D849 Immunodeficiency, unspecified: Secondary | ICD-10-CM | POA: Diagnosis not present

## 2024-01-08 NOTE — Telephone Encounter (Signed)
 Copied from CRM [id number]. Topic: General - Other >> Jan 08, 2024 10:37 AM Selinda RAMAN wrote:  Reason for CRM: [name] the spouse of the patient wanted to give the heads up to the provider that [facility] should be faxing over an order from [facility] concerning incontinence supplies for the patient. Please be on the lookout and assist patient further by signing the orders, sending them back and letting her know it has been sent.

## 2024-01-08 NOTE — Telephone Encounter (Signed)
 Fax received for [facility] and placed in providers box for review.

## 2024-01-08 NOTE — Telephone Encounter (Signed)
 Noted! Thank you

## 2024-01-09 DIAGNOSIS — Z941 Heart transplant status: Secondary | ICD-10-CM | POA: Diagnosis not present

## 2024-01-13 DIAGNOSIS — D598 Other acquired hemolytic anemias: Secondary | ICD-10-CM | POA: Diagnosis not present

## 2024-01-14 ENCOUNTER — Other Ambulatory Visit: Payer: Self-pay | Admitting: Internal Medicine

## 2024-01-14 DIAGNOSIS — I69198 Other sequelae of nontraumatic intracerebral hemorrhage: Secondary | ICD-10-CM | POA: Diagnosis not present

## 2024-01-14 DIAGNOSIS — R261 Paralytic gait: Secondary | ICD-10-CM | POA: Diagnosis not present

## 2024-01-14 NOTE — Telephone Encounter (Signed)
 Copied from CRM [id number]. Topic: Clinical - Medication Refill >> Jan 14, 2024  2:48 PM Everette C wrote: Medication: pregabalin  (LYRICA ) 75 MG capsule [[id number]]  Has the patient contacted their pharmacy? Yes (Agent: If no, request that the patient contact the pharmacy for the refill. If patient does not wish to contact the pharmacy document the reason why and proceed with request.) (Agent: If yes, when and what did the pharmacy advise?)  This is the patient's preferred pharmacy:  [facility] - [place], [place] - [address] [place] [address] Phone: [phone] Fax: [phone]  Is this the correct pharmacy for this prescription? Yes If no, delete pharmacy and type the correct one.   Has the prescription been filled recently? Yes  Is the patient out of the medication? Yes  Has the patient been seen for an appointment in the last year OR does the patient have an upcoming appointment? Yes  Can we respond through MyChart? No  Agent: Please be advised that Rx refills may take up to 3 business days. We ask that you follow-up with your pharmacy.

## 2024-01-15 ENCOUNTER — Telehealth: Payer: Self-pay | Admitting: Internal Medicine

## 2024-01-15 DIAGNOSIS — I69354 Hemiplegia and hemiparesis following cerebral infarction affecting left non-dominant side: Secondary | ICD-10-CM

## 2024-01-15 NOTE — Telephone Encounter (Signed)
 Copied from CRM #[id number]. Topic: Referral - Status >> Jan 14, 2024  3:07 PM Everette C wrote:  Reason for CRM: The patient's wife has called to inquire on the status of their previous referral requests for neurological and personal care services   Please contact further when possible

## 2024-01-16 DIAGNOSIS — E1159 Type 2 diabetes mellitus with other circulatory complications: Secondary | ICD-10-CM | POA: Diagnosis not present

## 2024-01-16 DIAGNOSIS — Z794 Long term (current) use of insulin: Secondary | ICD-10-CM | POA: Diagnosis not present

## 2024-01-16 DIAGNOSIS — E1165 Type 2 diabetes mellitus with hyperglycemia: Secondary | ICD-10-CM | POA: Diagnosis not present

## 2024-01-16 DIAGNOSIS — E11649 Type 2 diabetes mellitus with hypoglycemia without coma: Secondary | ICD-10-CM | POA: Diagnosis not present

## 2024-01-16 DIAGNOSIS — E782 Mixed hyperlipidemia: Secondary | ICD-10-CM | POA: Diagnosis not present

## 2024-01-18 DIAGNOSIS — R261 Paralytic gait: Secondary | ICD-10-CM | POA: Diagnosis not present

## 2024-01-20 NOTE — Telephone Encounter (Unsigned)
 Copied from CRM #[id number]. Topic: General - Other >> Jan 20, 2024  1:32 PM Rachelle R wrote: Reason for CRM: Alan from [facility] calling to report patient will miss 2 PT visits this week as he is out of town.  Alan can be reached at [phone]

## 2024-01-20 NOTE — Telephone Encounter (Signed)
 Noted

## 2024-01-20 NOTE — Addendum Note (Signed)
 Addended by: VICCI SOBER B on: 01/20/2024 01:39 PM   Modules accepted: Orders

## 2024-01-20 NOTE — Telephone Encounter (Signed)
 Please see message from wife. Referral for neurology was placed when he left hospital/inpatient rehab a month ago.  Looks like the referral has not been picked up as yet.  I have resubmitted the referral. Please let her know. I think we signed a form for him to get PCS services already correct?

## 2024-01-21 NOTE — Telephone Encounter (Signed)
 Video visit scheduled for 01/26/2024 to address incontinence form. Wife confirmed appointment.

## 2024-01-21 NOTE — Telephone Encounter (Signed)
 PCS request efaxed to [employer].  I called patient's wife and informed her that the order for PCS was placed.  Regarding neurology referral, she stated they have been contacted by neurology and he has an appoinment 02/24/2024

## 2024-01-26 ENCOUNTER — Telehealth: Admitting: Internal Medicine

## 2024-01-26 ENCOUNTER — Encounter: Payer: Self-pay | Admitting: Internal Medicine

## 2024-01-26 DIAGNOSIS — I69354 Hemiplegia and hemiparesis following cerebral infarction affecting left non-dominant side: Secondary | ICD-10-CM

## 2024-01-26 DIAGNOSIS — R159 Full incontinence of feces: Secondary | ICD-10-CM

## 2024-01-26 DIAGNOSIS — Z760 Encounter for issue of repeat prescription: Secondary | ICD-10-CM

## 2024-01-26 DIAGNOSIS — R3981 Functional urinary incontinence: Secondary | ICD-10-CM

## 2024-01-26 DIAGNOSIS — N3941 Urge incontinence: Secondary | ICD-10-CM

## 2024-01-26 DIAGNOSIS — R152 Fecal urgency: Secondary | ICD-10-CM

## 2024-01-26 MED ORDER — PREGABALIN 75 MG PO CAPS: 75.0000 mg | ORAL_CAPSULE | Freq: Every day | ORAL | 1 refills | Status: AC

## 2024-01-26 NOTE — Progress Notes (Signed)
 Patient ID: Manny Vitolo, male   DOB: Jul 24, 1985, 38 y.o.   MRN: [id number] Virtual Visit via Video Note  I connected with [name] Bolognese on 01/26/2024 at 8:11 AM by a video enabled telemedicine application and verified that I am speaking with the correct person using two identifiers.  Location: Patient: home and wife is with him and provides some of the history Provider: Office   I discussed the limitations of evaluation and management by telemedicine and the availability of in person appointments. The patient expressed understanding and agreed to proceed.  History of Present Illness: Patient with history of DM with painful neuropathy, HTN, HL, NICM with systolic CHF EF <20%, ICD ( Cardiologist Dr. 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  History of Present Illness Antwione Picotte is a 38 year old male with a history of stroke who presents for certification for incontinence supplies. He is accompanied by his wife.  He experiences both urinary and bowel incontinence, which began following his stroke. There is an urgency to urinate or have a bowel movement, and he often cannot reach the restroom in time due to this urgency and his left-sided weakness. His wife notes that sometimes he starts to have an accident as soon as he stands up or while on the way to the bathroom.  He has left-sided weakness affecting both his arm and leg, contributing to his difficulty in reaching the restroom in time. He is currently undergoing physical therapy.  He is using large-sized incontinence supplies but finds them tight  and is considering switching to extra-large. He prefers obtaining these supplies through [employer]. Endorses wanting chux and gloves as well  He is currently taking pregabalin  for pain management in his arm, which persists post-stroke. He reports issues with obtaining his medication refill from the pharmacy.     Outpatient Encounter Medications as of 01/26/2024  Medication Sig   ACCU-CHEK GUIDE test strip USE TO CHECK BLOOD SUGAR THREE TIMES DAILY.   Accu-Chek Softclix Lancets lancets Use 3 (three) times daily as directed to check blood sugar. May dispense any manufacturer covered by patient's insurance and fits patient's device.   acetaminophen  (TYLENOL ) 325 MG tablet Take 1-2 tablets (325-650 mg total) by mouth every 4 (four) hours as needed for mild pain.   atorvastatin  (LIPITOR ) 10 MG tablet Take 1 tablet (10 mg total) by mouth at bedtime.   benzonatate  (TESSALON ) 100 MG capsule Take 1 capsule (100 mg total) by mouth 2 (two) times daily as needed for cough.   Blood Glucose Monitoring Suppl (ACCU-CHEK GUIDE ME) w/Device KIT USE TO CHECK BLOOD SUGAR THREE TIMES DAILY.   Blood Glucose Monitoring Suppl (BLOOD GLUCOSE MONITOR SYSTEM) w/Device KIT Use 3 (three) times daily. May dispense any manufacturer covered by patient's insurance.   Blood Pressure Monitor DEVI Use as directed to check home blood pressure 2-3 times a week   calcium  acetate (PHOSLO ) 667 MG capsule Take 2 capsules (1,334 mg total) by mouth in the morning and at bedtime.   carvedilol  (COREG ) 25 MG tablet Take 1 tablet (25 mg total) by mouth 2 (two) times daily with a meal.   Continuous Glucose Sensor (FREESTYLE LIBRE 2 SENSOR) MISC UAD  diclofenac  Sodium (VOLTAREN ) 1 % GEL Apply 2 g topically 4 (four) times daily as needed (pain).   DULoxetine  (CYMBALTA ) 60 MG capsule Take 1 capsule (60 mg total) by mouth daily. For chronic pain   EASY COMFORT PEN NEEDLES 32G X 4 MM MISC USE TO INJECT INSULIN  FOUR TIMES A DAY   ezetimibe  (ZETIA )  10 MG tablet Take 1 tablet (10 mg total) by mouth daily.   fluticasone (FLONASE) 50 MCG/ACT nasal spray Place 1 spray into both nostrils as needed for allergies.   GENGRAF  25 MG capsule Take 5 capsules (125mg ) by mouth in the morning and at bedtime.   GLUCAGON NA Place 3 sprays into the nose once as needed.   Glucose Blood (BLOOD GLUCOSE TEST STRIPS) STRP Use 3 (three) times daily as directed to check blood sugar. May dispense any manufacturer covered by patient's insurance and fits patient's device.   insulin  glargine (LANTUS ) 100 UNIT/ML Solostar Pen Inject 20 Units into the skin at bedtime.   insulin  lispro (HUMALOG  KWIKPEN) 100 UNIT/ML KwikPen Inject 14-16 Units into the skin 3 (three) times daily. 8 am,  noon and 6 pm with meals.   Insulin  Pen Needle 32G X 4 MM MISC Use as directed with insulin  pens.   Insulin  Pen Needle 32G X 4 MM MISC 1 each by Does not apply route 3 (three) times daily. May dispense any manufacturer covered by patient's insurance.   Insulin  Syringe-Needle U-100 (RELION INSULIN  SYRINGE) 31G X 15/64 0.3 ML MISC Use to inject insulin  daily.   Lancet Device MISC 1 each by Does not apply route 3 (three) times daily. May dispense any manufacturer covered by patient's insurance.   levETIRAcetam  (KEPPRA ) 750 MG tablet Take 2 tablets (1,500 mg total) by mouth 2 (two) times daily.   lidocaine  (LIDODERM ) 5 % Place 2 patches onto the skin daily. Remove & Discard patch within 12 hours or as directed by MD   losartan  (COZAAR ) 100 MG tablet Take 1 tablet (100 mg total) by mouth daily.   magnesium  oxide (MAG-OX) 400 (240 Mg) MG tablet Take 2 tablets (800 mg total) by mouth 2 (two) times daily.   melatonin 3 MG TABS tablet Take 1 tablet (3 mg total) by mouth at bedtime.   Oxcarbazepine  (TRILEPTAL ) 300 MG tablet Take 1 tablet by mouth every morning and take 2 tablets by mouth before bedtime.   pantoprazole  (PROTONIX ) 40 MG tablet Take 1 tablet (40 mg total) by mouth daily.   predniSONE   (DELTASONE ) 5 MG tablet Take 1 tablet (5 mg total) by mouth daily.   pregabalin  (LYRICA ) 75 MG capsule Take 1 capsule (75 mg total) by mouth at bedtime.   senna-docusate (SENOKOT-S) 8.6-50 MG tablet Take 1-2 tablets by mouth 2 (two) times daily as needed for mild constipation.   traZODone  (DESYREL ) 50 MG tablet Take 1 tablet (50 mg total) by mouth at bedtime as needed for sleep.   valGANciclovir  (VALCYTE ) 450 MG tablet Take 2 tablets (900 mg total) by mouth daily with breakfast.   No facility-administered encounter medications on file as of 01/26/2024.      Observations/Objective: Pt laying in bed in NAD  Assessment and Plan: 1. Urge incontinence of urine (Primary) 2. Functional urinary incontinence 3. Functional fecal incontinence 4. Incontinence of feces with fecal urgency 5. Hemiparesis affecting left side as late effect of stroke (HCC) -pt presenting with urge and functional incontinence of urine and bowel post prolong hosp in June 2025 and post CVA. He is working with  PT to help regian strength and function LT side. He is appropriate for incontinence supplies. Will complete form for [employer] for Depends, Chaux and gloves.   6.. Medication refill RF sent on Lyrica  to [facility]. NCCSRS reviwed. Last RF was early September    Follow Up Instructions: Keep in person office visit for later this mth   I discussed the assessment and treatment plan with the patient. The patient was provided an opportunity to ask questions and all were answered. The patient agreed with the plan and demonstrated an understanding of the instructions.   The patient was advised to call back or seek an in-person evaluation if the symptoms worsen or if the condition fails to improve as anticipated.  I spent 16 minutes dedicated to the care of this patient on the date of this encounter to include previsit review of chart including Dr. Millard note dated 12/30/23, some of the notes from [facility], face to  face time with pt discussing dx and management and post visit entering of orders. Note and orders to be sent to [employer].  This note has been created with Education officer, environmental. Any transcriptional errors are unintentional.  Barnie Louder, MD

## 2024-01-27 ENCOUNTER — Encounter: Attending: Physical Medicine & Rehabilitation | Admitting: Physical Medicine & Rehabilitation

## 2024-01-27 ENCOUNTER — Encounter: Payer: Self-pay | Admitting: Physical Medicine & Rehabilitation

## 2024-01-27 VITALS — BP 129/88 | HR 89 | Ht 71.0 in

## 2024-01-27 DIAGNOSIS — I619 Nontraumatic intracerebral hemorrhage, unspecified: Secondary | ICD-10-CM | POA: Diagnosis not present

## 2024-01-27 DIAGNOSIS — I69154 Hemiplegia and hemiparesis following nontraumatic intracerebral hemorrhage affecting left non-dominant side: Secondary | ICD-10-CM | POA: Insufficient documentation

## 2024-01-27 NOTE — Progress Notes (Signed)
 Subjective:    Patient ID: Jason Stevenson, male    DOB: [birth year], 38 y.o.   MRN: [id number] 38 y.o. 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  Patient underwent rehab then discharged home on 09/18/2023.  He was taken to ED on 06/18 due to weakness with hypotension diarrhea and lethargy and felt to be septic therefore transferred to [facility] for management.  Hospital course was significant for PEA arrest with 5 rounds of CPR with ROSC, repeat arrest 06/18 requiring ECMO through 06/21.  He was treated with broad-spectrum antibiotics for C. 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  Hospital course was significant for respiratory arrest requiring intubation and eventual tracheostomy, ESBL Kleb HCAP, CMV viremia, pleural effusions, renal failure requiring HD, acute blood loss anemia requiring multiple units PRBC as well as dysphagia.  He was decannulated by 11/17/2022 and diet advanced.     Chest tubes were removed by 08/03.  Cardiology and endocrinology have been following to assist with medical issues.  Renal status was improved and he can treated on renal diet due to elevated phosphorus with binders.  He was tolerating regular textures with thin liquids.  Per discharge summary patient transition to ganciclovir dose on 07/26 with plans for treatment close and every 2 weeks CMV PCR upon cessation x 3 months.  Therapy was consulted and patient was noted to be deconditioned and requiring assistance with ADLs and mobility.  CIR was recommended due to functional decline.    Admit date: 11/24/2023 Discharge date: 12/16/2023 HPI Discussed the use of AI scribe software for clinical note transcription with the patient, who gave verbal consent to proceed.  History of Present Illness Jason Stevenson is a 38 year old male with a history of stroke who presents for  follow-up on his rehabilitation progress.  He is receiving home health services, including physical therapy twice a week, occupational therapy twice a week, speech therapy once a week, and nursing once a week. Bathing is done outside of therapy sessions. He last followed up with his doctors at [facility] a couple of months ago.  He uses a Hemivalker for mobility and has noted improvement in his ability to walk independently, although he sometimes feels fearful of walking alone due to occasional tripping and crossing of his feet. His caregiver stays close to prevent falls, and therapy is focusing on improving his foot placement to enhance independence.  He experiences muscle spasms in his arm and leg, which he manages with stretching exercises at home. He wears a hand brace at night to help relax his arm, which tends to flex at the elbow during sleep. He reports sensitivity in his arm, sometimes feeling like nerve or vein pain, and practices moving his arm to improve function. He is currently on pregabalin  (Lyrica ) for nerve pain.  He reports occasional leg spasms and fatigue after prolonged standing. He has not noticed significant leg movements during sleep. He uses a wheelchair for mobility but is able to stand with assistance. He practices standing with a narrow base to improve balance.  He reports numbness in his right hand and uses a brace at night.   Pain Inventory Average Pain 4 Pain Right Now 4 My pain is intermittent, constant, sharp, tingling, and aching  LOCATION OF PAIN  left arm. Chest, left ankle, left hand  BOWEL Number of stools per week: 7 Oral laxative  use No    BLADDER Normal and Pads    Mobility walk without assistance use a walker how many minutes can you walk? 5-10 ability to climb steps?  yes do you drive?  no use a wheelchair needs help with transfers Do you have any goals in this area?  yes  Function disabled: date disabled 11/2022 I need assistance with the  following:  dressing, bathing, toileting, meal prep, household duties, and shopping Do you have any goals in this area?  yes  Neuro/Psych weakness numbness tingling trouble walking  Prior Studies Any changes since last visit?  no  Physicians involved in your care Any changes since last visit?  no   Family History  Problem Relation Age of Onset   Stroke Mother    Diabetes Mother    Hypertension Mother    Stroke Maternal Aunt    Heart attack Neg Hx    Social History   Socioeconomic History   Marital status: Married    Spouse name: [name]   Number of children: 0   Years of education: 13   Highest education level: Some college, no degree  Occupational History   Occupation: unemployed  Tobacco Use   Smoking status: Never   Smokeless tobacco: Never  Vaping Use   Vaping status: Never Used  Substance and Sexual Activity   Alcohol use: Not Currently    Comment: occasional when hanging out with the wrong people No recent use.   Drug use: Not Currently    Types: Marijuana    Comment: occasional, last 2013   Sexual activity: Not Currently  Other Topics Concern   Not on file  Social History Narrative   Pt lives at home with his wife    Right handed    Caffeine- hardly any   Social Drivers of Corporate investment banker Strain: Low Risk  (10/01/2023)   Received from [facility]   Overall Financial Resource Strain (CARDIA)    Difficulty of Paying Living Expenses: Not very hard  Food Insecurity: Food Insecurity Present (10/01/2023)   Received from [facility]   Hunger Vital Sign    Within the past 12 months, you worried that your food would run out before you got the money to buy more.: Sometimes true    Within the past 12 months, the food you bought just didn't last and you didn't have money to get more.: Never true  Transportation Needs: No Transportation Needs (06/30/2023)   Received from [facility]  - Transportation    In the past 12 months, has lack of transportation kept you from medical appointments or from getting medications?: No    Lack of Transportation (Non-Medical): No  Physical Activity: Inactive (03/20/2023)   Exercise Vital Sign    Days of Exercise per Week: 0 days    Minutes of Exercise per Session: 0 min  Stress: Stress Concern Present (03/31/2023)   [employer] of Occupational Health - Occupational Stress Questionnaire    Feeling of Stress : To some extent  Social Connections: Moderately Integrated (03/20/2023)   Social Connection and Isolation Panel    Frequency of Communication with Friends and Family: Once a week    Frequency of Social Gatherings with Friends and Family: Never    Attends Religious Services: More than 4 times per year    Active Member of [employer] or Organizations: Yes    Attends Banker Meetings: More than 4 times per  year    Marital Status: Married   Past Surgical History:  Procedure Laterality Date   ICD IMPLANT N/A 08/27/2021   Procedure: ICD IMPLANT;  Surgeon: Cindie Ole DASEN, MD;  Location[facility] INVASIVE CV LAB;  Service: Cardiovascular;  Laterality: N/A;   None     RIGHT HEART CATH  06/21/2021   [facility]   RIGHT HEART CATH N/A 05/17/2022   Procedure: RIGHT HEART CATH;  Surgeon: Rolan Ezra RAMAN, MD;  Location: [facility] INVASIVE CV LAB;  Service: Cardiovascular;  Laterality: N/A;   RIGHT/LEFT HEART CATH AND CORONARY ANGIOGRAPHY N/A 11/02/2020   Procedure: RIGHT/LEFT HEART CATH AND CORONARY ANGIOGRAPHY;  Surgeon: Rolan Ezra RAMAN, MD;  Location: [facility] INVASIVE CV LAB;  Service: Cardiovascular;  Laterality: N/A;   Past Medical History:  Diagnosis Date   Chronic systolic CHF (congestive heart failure) (HCC) 06/18/2015   Heart transplant recipient [facility])    Hemorrhagic stroke (HCC)    Hyperlipidemia    Hypertension    Immune deficiency disorder    Neuropathy    Nonischemic cardiomyopathy (HCC) Noted as early as 2007   Per chart  review (cards consult note 2011), EF of 40% in 2007, down to 20-25% in 2011   Seizures (HCC)    Stroke (cerebrum) (HCC)    Type 2 diabetes mellitus (HCC)    BP 129/88   Pulse 89   SpO2 95%   Opioid Risk Score:   Fall Risk Score:  `1  Depression screen PHQ 2/9     12/30/2023   10:14 AM 03/31/2023    2:51 PM 03/20/2023    3:24 PM 03/06/2023   11:28 AM 08/08/2022    9:53 AM 01/18/2022    7:37 AM 11/13/2021   10:51 AM  Depression screen PHQ 2/9  Decreased Interest 0 1 1 0 0 0 0  Down, Depressed, Hopeless 0 1 1 0 1 1 1   PHQ - 2 Score 0 2 2 0 1 1 1   Altered sleeping 1 0 0 0 0 0 1  Tired, decreased energy 0 1 1 0 1 0 1  Change in appetite 0 0 0 0 1 0 1  Feeling bad or failure about yourself  0 0 0 0 0 1 0  Trouble concentrating 0 0 0 0 1 1 1   Moving slowly or fidgety/restless 0 0 0 0 0 1 1  Suicidal thoughts 0 0 0 0 0 0 1  PHQ-9 Score 1 3 3  0 4 4 7   Difficult doing work/chores  Not difficult at all Not difficult at all Not difficult at all   Somewhat difficult    Review of Systems  Musculoskeletal:  Positive for gait problem.  Neurological:  Positive for weakness and numbness.       Tingling  Psychiatric/Behavioral:         Short- term memory   All other systems reviewed and are negative.      Objective:   Physical Exam General No acute distress Mood and affect appropriate Speech without dysarthria or aphasia Response latency was delayed Motor strength is 3 - at the left deltoid bicep tricep finger flexors and extensors 4 - at the left knee extensor 3 - left hip flexor 3 - ankle dorsiflexor Right side is 5/5 in the upper and lower limb Tone Elevated in the elbow flexors MAS 2/3 Wrist flexors MAS 2/3 finger flexors MAS 2 Lower extremity tone MAS 1 in hamstring otherwise 0 Goes from sit to stand with standby assistance Right hand  with paresthesia in the entire hand although difficult for patient to describe the  area No evidence of atrophy in the hand intrinsic  muscles Normal strength in the right hand Did not ambulate today     Assessment & Plan:   Assessment and Plan Assessment & Plan Functional deficits and impaired mobility due to post heart transplant CMV infection Functional deficits and impaired mobility persist post heart transplant CMV infection. Progressing with therapy but requires assistance due to fear of falling and occasional tripping. - Continue home health services with PT, OT, speech therapy, and nursing. - Encourage practice of foot placement and balance exercises. - Discuss transition to outpatient therapy once home health services conclude.  Spasticity and contracture of left upper extremity Spasticity and contracture in left upper extremity, affecting elbow and fingers. Considering Botox for wrist flexors if condition worsens. Botox safe with immunosuppression but requires heart transplant team approval. - Continue stretching exercises and use of hand splint at night. - Discuss potential use of Botox with heart transplant team if spasticity worsens. - Consult with therapists regarding spasticity management.  Right hand numbness and possible carpal tunnel syndrome Right hand numbness with suspicion of carpal tunnel syndrome, possibly due to walker use. - Order EMG and nerve conduction study for right upper extremity to evaluate for carpal tunnel syndrome.  Chronic neuropathic pain Chronic neuropathic pain managed with Lyrica . Wrist pain likely nerve-related. - Continue Lyrica  for management of chronic neuropathic pain.

## 2024-01-29 ENCOUNTER — Telehealth: Payer: Self-pay | Admitting: Internal Medicine

## 2024-01-29 NOTE — Telephone Encounter (Signed)
 Copied from CRM #[id number]. Topic: General - Other >> Jan 29, 2024  2:02 PM Zebedee SAUNDERS wrote:  Reason for CRM: Received call from [facility] per [facility] ph: [phone] reporting pt's blood pressure 132/92.

## 2024-02-03 NOTE — Telephone Encounter (Signed)
 noted

## 2024-02-10 ENCOUNTER — Ambulatory Visit: Admitting: Internal Medicine

## 2024-02-12 ENCOUNTER — Ambulatory Visit: Attending: Internal Medicine | Admitting: Internal Medicine

## 2024-02-12 ENCOUNTER — Encounter: Payer: Self-pay | Admitting: Internal Medicine

## 2024-02-12 ENCOUNTER — Telehealth: Payer: Self-pay | Admitting: Internal Medicine

## 2024-02-12 VITALS — BP 127/82 | HR 89 | Resp 19 | Ht 71.0 in | Wt 187.0 lb

## 2024-02-12 DIAGNOSIS — G40909 Epilepsy, unspecified, not intractable, without status epilepticus: Secondary | ICD-10-CM

## 2024-02-12 DIAGNOSIS — Z941 Heart transplant status: Secondary | ICD-10-CM

## 2024-02-12 DIAGNOSIS — E1159 Type 2 diabetes mellitus with other circulatory complications: Secondary | ICD-10-CM

## 2024-02-12 DIAGNOSIS — E1169 Type 2 diabetes mellitus with other specified complication: Secondary | ICD-10-CM

## 2024-02-12 DIAGNOSIS — E785 Hyperlipidemia, unspecified: Secondary | ICD-10-CM | POA: Diagnosis not present

## 2024-02-12 DIAGNOSIS — E139 Other specified diabetes mellitus without complications: Secondary | ICD-10-CM

## 2024-02-12 DIAGNOSIS — I152 Hypertension secondary to endocrine disorders: Secondary | ICD-10-CM

## 2024-02-12 DIAGNOSIS — I69354 Hemiplegia and hemiparesis following cerebral infarction affecting left non-dominant side: Secondary | ICD-10-CM | POA: Diagnosis not present

## 2024-02-12 DIAGNOSIS — Z794 Long term (current) use of insulin: Secondary | ICD-10-CM

## 2024-02-12 DIAGNOSIS — R3981 Functional urinary incontinence: Secondary | ICD-10-CM

## 2024-02-12 DIAGNOSIS — N3941 Urge incontinence: Secondary | ICD-10-CM

## 2024-02-12 MED ORDER — ATORVASTATIN CALCIUM 20 MG PO TABS: 20.0000 mg | ORAL_TABLET | Freq: Every day | ORAL | 1 refills | Status: AC

## 2024-02-12 NOTE — Patient Instructions (Signed)
  VISIT SUMMARY: During your visit, we discussed the management of your chronic conditions, including the follow-up care for your stroke, diabetes, lung transplant, hypertension, high cholesterol, and seizure disorder. We also addressed your recent fall and issues with urinary incontinence supplies.  YOUR PLAN: -STATUS POST STROKE WITH LEFT-SIDED HEMIPARESIS: You are experiencing weakness on your left side following your stroke. We will continue your physical, occupational, and speech therapy. Additionally, we recommend installing grab bars and a shower chair to improve safety at home.  -TYPE 2 DIABETES MELLITUS WITH HYPERGLYCEMIA, REQUIRING INSULIN : Your diabetes is not well controlled, with high blood sugar levels. We will continue your current insulin  regimen and transition you to the [facility] 3 continuous glucose monitor. We also discussed dietary changes to reduce your intake of high-sugar foods.  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 Continue taking prednisone , GenGraft, sirolimus, and valganciclovir  as prescribed and maintain regular follow-ups with the transplant clinic.  -ESSENTIAL HYPERTENSION: Your blood pressure is being managed with medication. Continue taking losartan  and carvedilol  as prescribed.  -HYPERLIPIDEMIA: Your cholesterol levels are higher than desired. We will increase your atorvastatin  dose to 20 mg daily to help lower your LDL cholesterol.  -SEIZURE DISORDER: You have a history of seizures and are taking Keppra  and Trileptal . Continue these medications as prescribed.  -URINARY INCONTINENCE: You are experiencing difficulty obtaining incontinence supplies. We will resend the necessary paperwork to [facility] to address this issue.  -GENERAL HEALTH MAINTENANCE: You received your flu shot in September and had a diabetic eye exam in March, which showed no diabetic changes. Ensure you get your annual  flu vaccination.  INSTRUCTIONS: Please follow up with your physical, occupational, and speech therapy sessions. Install grab bars and a shower chair at home. Continue your current medications and follow the dietary recommendations discussed. Transition to the [place] 3 continuous glucose monitor. Follow up with the transplant clinic regularly. We will send an updated prescription for atorvastatin  to the pharmacy and resend paperwork for incontinence supplies to [facility].                      Contains text generated by Abridge.                                 Contains text generated by Abridge.

## 2024-02-12 NOTE — Progress Notes (Unsigned)
 Needs new gait belt.

## 2024-02-12 NOTE — Telephone Encounter (Signed)
 Copied from CRM #[id number]. Topic: Clinical - Home Health Verbal Orders >> Feb 12, 2024  9:25 AM Logan F wrote:  Caller/Agency: Mallie from [facility]  Callback Number: [phone] Service Requested: Physical Therapy Frequency: 2 weeks 3; 1 week 1; 2 weeks 3; 1 week 2 Any new concerns about the patient? No

## 2024-02-12 NOTE — Progress Notes (Unsigned)
 Patient ID: Jason Stevenson, male    DOB: Jul 25, 1985  MRN: [id number]  CC: Urinary Incontinence and Medical Management of Chronic Issues   Subjective: Jason Stevenson is a 38 y.o. male who presents for chronic ds management. 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  History of Present Illness Jason Stevenson is a 38 year old male with a history of stroke and diabetes who presents for follow-up of his chronic medical conditions. He is accompanied by his wife.  In April, he experienced a stroke resulting in left-sided weakness affecting his arm, leg, and foot. He underwent a prolonged hospitalization, including intubation and a tracheostomy, which has since been removed. He is currently receiving home physical therapy twice a week, occupational therapy twice a week, and speech therapy once a week. He completed a three-week inpatient rehabilitation program. He primarily uses a wheelchair for mobility but is learning to use a walker for short distances. The patient's wife reported that he slipped off a toilet a couple of weeks ago while in [place], in a location without grab bars. He is currently taking sponge baths as he is unable to access the shower upstairs.  His diabetes is managed by [facility]. His last A1c in August was 7.2. He is on glargine insulin  18 units daily and Humalog  insulin  8 units three times a day with meals. He uses a continuous glucose monitor and is  transitioning to the [place] 3. His time in range is currently 23% over the past week, with 77% of readings above target. He consumes diet drinks and snacks like peanut butter crackers and fruits, including high-sugar fruits like pineapples and grapes.  He is followed by the transplant clinic and is on prednisone , GenGraft, sirolimus (1 mg daily), and valganciclovir . He was previously on tacrolimus , which was discontinued due to hemolysis concerns.  He is on atorvastatin  10 mg for cholesterol management, with a recent LDL of 144 mg/dL. He also takes carvedilol  25 mg twice daily and losartan  100 mg for blood pressure control.  He has a history of seizures and is on Keppra  and Trileptal  (oxcarbazepine ).  He received a flu shot in September at [place]. His last diabetic eye exam was in March at [facility] [place], with no diabetic changes noted.    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   Poorly controlled type 2 diabetes mellitus with circulatory disorder (HCC) 03/19/2021   Ischemic cardiomyopathy    Syncope 12/14/2020   Chest pain 10/30/2020   History of cerebrovascular accident (CVA) with residual  deficit 10/27/2020   Acute CVA (cerebrovascular accident) (HCC) 09/21/2020   Near syncope 09/21/2020   LV (left ventricular) mural thrombus 09/13/2020   Seizure (HCC) 09/13/2020   Neurological deficit present 09/12/2020    History of COVID-19 09/12/2020   Nontraumatic cerebral hemorrhage (HCC) 09/12/2020   Acute cerebrovascular accident (CVA) (HCC) 09/12/2020   COVID-19 08/23/2020   Noncompliance with medication treatment due to intermittent use of medication 10/12/2018   Dyslipidemia 04/17/2016   Chronic systolic CHF (congestive heart failure) (HCC) 06/18/2015   Needs flu shot 12/23/2013   Diabetes mellitus (HCC) 06/17/2013   Non-ischemic cardiomyopathy (HCC) 11/06/2012   HTN (hypertension) 11/06/2012   HLD (hyperlipidemia) 11/06/2012     Current Outpatient Medications on File Prior to Visit  Medication Sig Dispense Refill   ACCU-CHEK GUIDE test strip USE TO CHECK BLOOD SUGAR THREE TIMES DAILY. 100 each 2   Accu-Chek Softclix Lancets lancets Use 3 (three) times daily as directed to check blood sugar. May dispense any manufacturer covered by patient's insurance and fits patient's device. 100 each 0   acetaminophen  (TYLENOL ) 325 MG tablet Take 1-2 tablets (325-650 mg total) by mouth every 4 (four) hours as needed for mild pain.     atorvastatin  (LIPITOR ) 10 MG tablet Take 1 tablet (10 mg total) by mouth at bedtime. 90 tablet 1   Blood Glucose Monitoring Suppl (ACCU-CHEK GUIDE ME) w/Device KIT USE TO CHECK BLOOD SUGAR THREE TIMES DAILY. 1 kit 0   Blood Glucose Monitoring Suppl (BLOOD GLUCOSE MONITOR SYSTEM) w/Device KIT Use 3 (three) times daily. May dispense any manufacturer covered by patient's insurance. 1 kit 0   Blood Pressure Monitor DEVI Use as directed to check home blood pressure 2-3 times a week 1 Device 0   calcium  acetate (PHOSLO ) 667 MG capsule Take 2 capsules (1,334 mg total) by mouth in the morning and at bedtime. 90 capsule 0   carvedilol  (COREG ) 25 MG tablet Take 1 tablet (25 mg total) by mouth 2 (two) times daily with a meal. 60 tablet 0   Continuous Glucose Sensor (FREESTYLE LIBRE 2 SENSOR) MISC UAD 6 each 1   diclofenac  Sodium (VOLTAREN ) 1 % GEL Apply 2 g topically 4 (four) times daily as  needed (pain). 150 g 0   DULoxetine  (CYMBALTA ) 60 MG capsule Take 1 capsule (60 mg total) by mouth daily. For chronic pain 30 capsule 1   EASY COMFORT PEN NEEDLES 32G X 4 MM MISC USE TO INJECT INSULIN  FOUR TIMES A DAY 200 each 2   ezetimibe  (ZETIA ) 10 MG tablet Take 1 tablet (10 mg total) by mouth daily. 90 tablet 1   fluticasone (FLONASE) 50 MCG/ACT nasal spray Place 1 spray into both nostrils as needed for allergies.     GENGRAF  25 MG capsule Take 5 capsules (125mg ) by mouth in the morning and at bedtime. 300 capsule 0   GLUCAGON NA Place 3 sprays into the nose once as needed.     Glucose Blood (BLOOD GLUCOSE TEST STRIPS) STRP Use 3 (three) times daily as directed to check blood sugar. May dispense any manufacturer covered by patient's insurance and fits patient's device. 100 strip 0   insulin  glargine (LANTUS ) 100 UNIT/ML Solostar Pen Inject 20 Units into the skin at bedtime. 15 mL 5   insulin  lispro (HUMALOG  KWIKPEN) 100 UNIT/ML KwikPen Inject 14-16 Units into the skin 3 (three) times daily. 8 am,  noon and 6 pm with meals. 15 mL 11   Insulin  Pen Needle 32G X 4 MM MISC  Use as directed with insulin  pens. 100 each 0   Insulin  Pen Needle 32G X 4 MM MISC 1 each by Does not apply route 3 (three) times daily. May dispense any manufacturer covered by patient's insurance. 100 each 0   Insulin  Syringe-Needle U-100 (RELION INSULIN  SYRINGE) 31G X 15/64 0.3 ML MISC Use to inject insulin  daily. 100 each 11   Lancet Device MISC 1 each by Does not apply route 3 (three) times daily. May dispense any manufacturer covered by patient's insurance. 1 each 0   levETIRAcetam  (KEPPRA ) 750 MG tablet Take 2 tablets (1,500 mg total) by mouth 2 (two) times daily. 360 tablet 1   lidocaine  (LIDODERM ) 5 % Place 2 patches onto the skin daily. Remove & Discard patch within 12 hours or as directed by MD 60 patch 0   losartan  (COZAAR ) 100 MG tablet Take 1 tablet (100 mg total) by mouth daily. 90 tablet 1   magnesium  oxide  (MAG-OX) 400 (240 Mg) MG tablet Take 2 tablets (800 mg total) by mouth 2 (two) times daily. 120 tablet 0   melatonin 3 MG TABS tablet Take 1 tablet (3 mg total) by mouth at bedtime. 30 tablet 0   Oxcarbazepine  (TRILEPTAL ) 300 MG tablet Take 1 tablet by mouth every morning and take 2 tablets by mouth before bedtime. 90 tablet 0   pantoprazole  (PROTONIX ) 40 MG tablet Take 1 tablet (40 mg total) by mouth daily. 30 tablet 0   predniSONE  (DELTASONE ) 5 MG tablet Take 1 tablet (5 mg total) by mouth daily. 30 tablet 0   pregabalin  (LYRICA ) 75 MG capsule Take 1 capsule (75 mg total) by mouth at bedtime. 90 capsule 1   senna-docusate (SENOKOT-S) 8.6-50 MG tablet Take 1-2 tablets by mouth 2 (two) times daily as needed for mild constipation.     sirolimus (RAPAMUNE) 1 MG tablet Take 1 mg by mouth daily.     valGANciclovir  (VALCYTE ) 450 MG tablet Take 2 tablets (900 mg total) by mouth daily with breakfast. 60 tablet 0   traZODone  (DESYREL ) 50 MG tablet Take 1 tablet (50 mg total) by mouth at bedtime as needed for sleep. (Patient not taking: Reported on 02/12/2024) 30 tablet 4   No current facility-administered medications on file prior to visit.    No Known Allergies  Social History   Socioeconomic History   Marital status: Married    Spouse name: [name]   Number of children: 0   Years of education: 13   Highest education level: Some college, no degree  Occupational History   Occupation: unemployed  Tobacco Use   Smoking status: Never   Smokeless tobacco: Never  Vaping Use   Vaping status: Never Used  Substance and Sexual Activity   Alcohol use: Not Currently    Comment: occasional when hanging out with the wrong people No recent use.   Drug use: Not Currently    Types: Marijuana    Comment: occasional, last 2013   Sexual activity: Not Currently  Other Topics Concern   Not on file  Social History Narrative   Pt lives at home with his wife    Right handed    Caffeine- hardly any    Social Drivers of Health   Financial Resource Strain: Medium Risk (02/10/2024)   Overall Financial Resource Strain (CARDIA)    Difficulty of Paying Living Expenses: Somewhat hard  Food Insecurity: Food Insecurity Present (02/10/2024)   Hunger Vital Sign    Worried About Running Out of Food in the  Last Year: Sometimes true    Ran Out of Food in the Last Year: Sometimes true  Transportation Needs: Unmet Transportation Needs (02/10/2024)   PRAPARE - Administrator, Civil Service (Medical): Not on file    Lack of Transportation (Non-Medical): Yes  Physical Activity: Inactive (02/10/2024)   Exercise Vital Sign    Days of Exercise per Week: 0 days    Minutes of Exercise per Session: Not on file  Stress: No Stress Concern Present (02/10/2024)   [employer] of Occupational Health - Occupational Stress Questionnaire    Feeling of Stress: Only a little  Social Connections: Moderately Integrated (02/10/2024)   Social Connection and Isolation Panel    Frequency of Communication with Friends and Family: Once a week    Frequency of Social Gatherings with Friends and Family: Once a week    Attends Religious Services: 1 to 4 times per year    Active Member of [employer] or Organizations: Yes    Attends Banker Meetings: 1 to 4 times per year    Marital Status: Married  Catering Manager Violence: Not At Risk (03/20/2023)   Humiliation, Afraid, Rape, and Kick questionnaire    Fear of Current or Ex-Partner: No    Emotionally Abused: No    Physically Abused: No    Sexually Abused: No    Family History  Problem Relation Age of Onset   Stroke Mother    Diabetes Mother    Hypertension Mother    Stroke Maternal Aunt    Heart attack Neg Hx     Past Surgical History:  Procedure Laterality Date   ICD IMPLANT N/A 08/27/2021   Procedure: ICD IMPLANT;  Surgeon: Cindie Ole DASEN, MD;  Location: [facility] INVASIVE CV LAB;  Service: Cardiovascular;  Laterality: N/A;   None      RIGHT HEART CATH  06/21/2021   [facility]   RIGHT HEART CATH N/A 05/17/2022   Procedure: RIGHT HEART CATH;  Surgeon: Rolan Ezra RAMAN, MD;  Location: [facility] INVASIVE CV LAB;  Service: Cardiovascular;  Laterality: N/A;   RIGHT/LEFT HEART CATH AND CORONARY ANGIOGRAPHY N/A 11/02/2020   Procedure: RIGHT/LEFT HEART CATH AND CORONARY ANGIOGRAPHY;  Surgeon: Rolan Ezra RAMAN, MD;  Location: [facility] INVASIVE CV LAB;  Service: Cardiovascular;  Laterality: N/A;    ROS: Review of Systems Negative except as stated above  PHYSICAL EXAM: BP 127/82 (BP Location: Right Arm, Patient Position: Sitting, Cuff Size: Normal)   Pulse 89   Resp 19   Ht 5' 11 (1.803 m)   Wt 187 lb (84.8 kg)   SpO2 98%   BMI 26.08 kg/m   Physical Exam   {male adult master:310785}     Latest Ref Rng & Units 12/15/2023    5:49 AM 12/11/2023    4:55 AM 12/08/2023    5:01 AM  CMP  Glucose 70 - 99 mg/dL 677  793  826   BUN 6 - 20 mg/dL 23  17  16    Creatinine 0.61 - 1.24 mg/dL 8.80  8.99  9.04   Sodium 135 - 145 mmol/L 138  141  138   Potassium 3.5 - 5.1 mmol/L 4.4  4.0  3.8   Chloride 98 - 111 mmol/L 102  106  107   CO2 22 - 32 mmol/L 26  26  25    Calcium  8.9 - 10.3 mg/dL 8.9  9.2  9.1    Lipid Panel     Component Value Date/Time   CHOL 175  04/30/2022 1106   CHOL 107 06/08/2021 0851   TRIG 82 04/30/2022 1106   HDL 61 04/30/2022 1106   HDL 49 06/08/2021 0851   CHOLHDL 2.9 04/30/2022 1106   VLDL 16 04/30/2022 1106   LDLCALC 98 04/30/2022 1106   LDLCALC 31 06/08/2021 0851    CBC    Component Value Date/Time   WBC 4.9 12/15/2023 0549   RBC 3.49 (L) 12/15/2023 0549   HGB 9.9 (L) 12/15/2023 0549   HGB 12.8 (L) 10/25/2022 1012   HCT 30.9 (L) 12/15/2023 0549   HCT 39.1 10/25/2022 1012   PLT 224 12/15/2023 0549   PLT 195 10/25/2022 1012   MCV 88.5 12/15/2023 0549   MCV 88 10/25/2022 1012   MCH 28.4 12/15/2023 0549   MCHC 32.0 12/15/2023 0549   RDW 15.3 12/15/2023 0549   RDW 12.2 10/25/2022 1012   LYMPHSABS 2.3  11/25/2023 0443   LYMPHSABS 2.0 08/03/2021 1313   MONOABS 0.3 11/25/2023 0443   EOSABS 0.1 11/25/2023 0443   EOSABS 0.1 08/03/2021 1313   BASOSABS 0.2 (H) 11/25/2023 0443   BASOSABS 0.1 08/03/2021 1313    ASSESSMENT AND PLAN:  Assessment and Plan Assessment & Plan Status post stroke with left-sided hemiparesis Residual left-sided hemiparesis post-stroke, progressing with therapy. Recent fall due to lack of home safety modifications. - Continue home physical therapy, occupational therapy, and speech therapy. - Install grab bars and shower chair for upstairs access.  Type 2 diabetes mellitus with hyperglycemia, requiring insulin  Type 2 diabetes with suboptimal glycemic control. A1c 7.2%. High-sugar diet contributing to hyperglycemia. Goal: 70% time in target glucose range. - Continue glargine 18 units daily and Humalog  8 units three times daily with meals. - Transition to [place] 3 continuous glucose monitor. - Educate on dietary modifications to reduce high-sugar intake.  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 Regular follow-up with transplant clinic. - Continue prednisone , GenGraft, sirolimus, and valganciclovir . - Maintain regular follow-up with transplant clinic.  Essential hypertension - Continue losartan  100 mg daily and carvedilol  25 mg twice daily.  Hyperlipidemia LDL 144 mg/dL, above target. On atorvastatin  10 mg daily. - Increase atorvastatin  to 20 mg daily. - Send updated prescription to pharmacy.  Seizure disorder - Continue Keppra  and Trileptal  as prescribed.  Urinary incontinence Ongoing issues with obtaining incontinence supplies. - Resend paperwork for incontinence supplies to [facility].  General Health Maintenance Flu shot received. Diabetic eye exam in March showed no retinopathy. - Ensure annual flu vaccination.     1. 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) ***    Patient was given the opportunity to ask questions.  Patient verbalized understanding of the plan and was able to repeat key elements of the plan.   This documentation was completed using Paediatric nurse.  Any transcriptional errors are unintentional.  No orders of the defined types were placed in this encounter.    Requested Prescriptions    No prescriptions requested or ordered in this encounter    No follow-ups on file.  Barnie Louder, MD, FACP

## 2024-02-13 ENCOUNTER — Encounter: Payer: Self-pay | Admitting: Internal Medicine

## 2024-02-13 MED ORDER — INSULIN GLARGINE 100 UNIT/ML SOLOSTAR PEN: 18.0000 [IU] | PEN_INJECTOR | Freq: Every day | SUBCUTANEOUS | 5 refills | Status: AC

## 2024-02-13 MED ORDER — INSULIN LISPRO (1 UNIT DIAL) 100 UNIT/ML (KWIKPEN): 8.0000 [IU] | PEN_INJECTOR | Freq: Three times a day (TID) | SUBCUTANEOUS | 11 refills | Status: AC

## 2024-02-14 ENCOUNTER — Ambulatory Visit: Payer: Self-pay | Admitting: Internal Medicine

## 2024-02-14 DIAGNOSIS — R261 Paralytic gait: Secondary | ICD-10-CM | POA: Diagnosis not present

## 2024-02-14 LAB — MICROALBUMIN / CREATININE URINE RATIO
Creatinine, Urine: 147.7 mg/dL
Microalb/Creat Ratio: 4 mg/g{creat} (ref 0–29)
Microalbumin, Urine: 5.3 ug/mL

## 2024-02-16 NOTE — Telephone Encounter (Signed)
 Copied from CRM [id number]. Topic: General - Call Back - No Documentation >> Feb 16, 2024 10:31 AM Alfonso ORN wrote:  Reason for CRM: PT with [facility] f/u on orders. Please contact [phone] with update.

## 2024-02-18 DIAGNOSIS — R261 Paralytic gait: Secondary | ICD-10-CM | POA: Diagnosis not present

## 2024-02-18 DIAGNOSIS — R32 Unspecified urinary incontinence: Secondary | ICD-10-CM | POA: Diagnosis not present

## 2024-02-18 DIAGNOSIS — I69198 Other sequelae of nontraumatic intracerebral hemorrhage: Secondary | ICD-10-CM | POA: Diagnosis not present

## 2024-02-18 NOTE — Telephone Encounter (Signed)
 Call returned to Mallie, PT/ [facility] and I had to leave a message with authorization for the PT visits requested.  I also left my call back number if she has any questions.

## 2024-02-19 ENCOUNTER — Telehealth: Payer: Self-pay | Admitting: Internal Medicine

## 2024-02-19 DIAGNOSIS — I5022 Chronic systolic (congestive) heart failure: Secondary | ICD-10-CM | POA: Diagnosis not present

## 2024-02-19 DIAGNOSIS — Z941 Heart transplant status: Secondary | ICD-10-CM | POA: Diagnosis not present

## 2024-02-19 DIAGNOSIS — I639 Cerebral infarction, unspecified: Secondary | ICD-10-CM | POA: Diagnosis not present

## 2024-02-19 DIAGNOSIS — R32 Unspecified urinary incontinence: Secondary | ICD-10-CM | POA: Diagnosis not present

## 2024-02-19 NOTE — Telephone Encounter (Signed)
 Call returned to [place], ST/ [facility] and I gave approval to continue with Speech therapy as requested.

## 2024-02-19 NOTE — Telephone Encounter (Signed)
 Copied from CRM #[id number]. Topic: Clinical - Home Health Verbal Orders >> Feb 19, 2024  3:24 PM Wess RAMAN wrote:  Caller/Agency: [place]/ [facility] Callback Number: [phone] Service Requested: Speech Therapy Frequency: 1 week 8 Any new concerns about the patient? No

## 2024-02-20 ENCOUNTER — Ambulatory Visit
Admission: RE | Admit: 2024-02-20 | Discharge: 2024-02-20 | Disposition: A | Discharge status: Home / Self Care | Source: Ambulatory Visit | Attending: Family Medicine | Admitting: Family Medicine

## 2024-02-20 DIAGNOSIS — I69118 Other symptoms and signs involving cognitive functions following nontraumatic intracerebral hemorrhage: Secondary | ICD-10-CM | POA: Diagnosis not present

## 2024-02-20 DIAGNOSIS — E785 Hyperlipidemia, unspecified: Secondary | ICD-10-CM | POA: Diagnosis not present

## 2024-02-20 DIAGNOSIS — E119 Type 2 diabetes mellitus without complications: Secondary | ICD-10-CM | POA: Diagnosis not present

## 2024-02-20 DIAGNOSIS — G629 Polyneuropathy, unspecified: Secondary | ICD-10-CM | POA: Diagnosis not present

## 2024-02-20 DIAGNOSIS — D849 Immunodeficiency, unspecified: Secondary | ICD-10-CM | POA: Diagnosis not present

## 2024-02-20 DIAGNOSIS — E43 Unspecified severe protein-calorie malnutrition: Secondary | ICD-10-CM | POA: Diagnosis not present

## 2024-02-20 DIAGNOSIS — D649 Anemia, unspecified: Secondary | ICD-10-CM | POA: Diagnosis not present

## 2024-02-20 DIAGNOSIS — Z941 Heart transplant status: Secondary | ICD-10-CM | POA: Diagnosis not present

## 2024-02-20 DIAGNOSIS — Z7952 Long term (current) use of systemic steroids: Secondary | ICD-10-CM | POA: Diagnosis not present

## 2024-02-20 DIAGNOSIS — Z794 Long term (current) use of insulin: Secondary | ICD-10-CM | POA: Diagnosis not present

## 2024-02-20 DIAGNOSIS — Z8616 Personal history of COVID-19: Secondary | ICD-10-CM | POA: Diagnosis not present

## 2024-02-20 DIAGNOSIS — I10 Essential (primary) hypertension: Secondary | ICD-10-CM | POA: Diagnosis not present

## 2024-02-20 DIAGNOSIS — Z6826 Body mass index (BMI) 26.0-26.9, adult: Secondary | ICD-10-CM | POA: Diagnosis not present

## 2024-02-20 DIAGNOSIS — G4089 Other seizures: Secondary | ICD-10-CM | POA: Diagnosis not present

## 2024-02-20 DIAGNOSIS — F320 Major depressive disorder, single episode, mild: Secondary | ICD-10-CM | POA: Diagnosis not present

## 2024-02-23 DIAGNOSIS — D649 Anemia, unspecified: Secondary | ICD-10-CM | POA: Diagnosis not present

## 2024-02-23 DIAGNOSIS — Z7952 Long term (current) use of systemic steroids: Secondary | ICD-10-CM | POA: Diagnosis not present

## 2024-02-23 DIAGNOSIS — I10 Essential (primary) hypertension: Secondary | ICD-10-CM | POA: Diagnosis not present

## 2024-02-23 DIAGNOSIS — E119 Type 2 diabetes mellitus without complications: Secondary | ICD-10-CM | POA: Diagnosis not present

## 2024-02-23 DIAGNOSIS — D849 Immunodeficiency, unspecified: Secondary | ICD-10-CM | POA: Diagnosis not present

## 2024-02-23 DIAGNOSIS — E785 Hyperlipidemia, unspecified: Secondary | ICD-10-CM | POA: Diagnosis not present

## 2024-02-23 DIAGNOSIS — Z6826 Body mass index (BMI) 26.0-26.9, adult: Secondary | ICD-10-CM | POA: Diagnosis not present

## 2024-02-23 DIAGNOSIS — F320 Major depressive disorder, single episode, mild: Secondary | ICD-10-CM | POA: Diagnosis not present

## 2024-02-23 DIAGNOSIS — Z941 Heart transplant status: Secondary | ICD-10-CM | POA: Diagnosis not present

## 2024-02-23 DIAGNOSIS — Z8616 Personal history of COVID-19: Secondary | ICD-10-CM | POA: Diagnosis not present

## 2024-02-23 DIAGNOSIS — G629 Polyneuropathy, unspecified: Secondary | ICD-10-CM | POA: Diagnosis not present

## 2024-02-23 DIAGNOSIS — G4089 Other seizures: Secondary | ICD-10-CM | POA: Diagnosis not present

## 2024-02-23 DIAGNOSIS — I69118 Other symptoms and signs involving cognitive functions following nontraumatic intracerebral hemorrhage: Secondary | ICD-10-CM | POA: Diagnosis not present

## 2024-02-23 DIAGNOSIS — Z794 Long term (current) use of insulin: Secondary | ICD-10-CM | POA: Diagnosis not present

## 2024-02-24 ENCOUNTER — Institutional Professional Consult (permissible substitution): Payer: Self-pay | Admitting: Diagnostic Neuroimaging

## 2024-02-25 ENCOUNTER — Ambulatory Visit: Payer: Self-pay | Admitting: Family Medicine

## 2024-02-25 DIAGNOSIS — Z2989 Encounter for other specified prophylactic measures: Secondary | ICD-10-CM | POA: Diagnosis not present

## 2024-02-25 DIAGNOSIS — Z4821 Encounter for aftercare following heart transplant: Secondary | ICD-10-CM | POA: Diagnosis not present

## 2024-02-25 DIAGNOSIS — Z79899 Other long term (current) drug therapy: Secondary | ICD-10-CM | POA: Diagnosis not present

## 2024-02-25 DIAGNOSIS — Z941 Heart transplant status: Secondary | ICD-10-CM | POA: Diagnosis not present

## 2024-02-25 DIAGNOSIS — D849 Immunodeficiency, unspecified: Secondary | ICD-10-CM | POA: Diagnosis not present

## 2024-02-25 DIAGNOSIS — Z48298 Encounter for aftercare following other organ transplant: Secondary | ICD-10-CM | POA: Diagnosis not present

## 2024-02-26 DIAGNOSIS — G4089 Other seizures: Secondary | ICD-10-CM | POA: Diagnosis not present

## 2024-02-26 DIAGNOSIS — Z941 Heart transplant status: Secondary | ICD-10-CM | POA: Diagnosis not present

## 2024-02-26 DIAGNOSIS — Z7952 Long term (current) use of systemic steroids: Secondary | ICD-10-CM | POA: Diagnosis not present

## 2024-02-26 DIAGNOSIS — D649 Anemia, unspecified: Secondary | ICD-10-CM | POA: Diagnosis not present

## 2024-02-26 DIAGNOSIS — Z794 Long term (current) use of insulin: Secondary | ICD-10-CM | POA: Diagnosis not present

## 2024-02-26 DIAGNOSIS — E119 Type 2 diabetes mellitus without complications: Secondary | ICD-10-CM | POA: Diagnosis not present

## 2024-02-26 DIAGNOSIS — D849 Immunodeficiency, unspecified: Secondary | ICD-10-CM | POA: Diagnosis not present

## 2024-02-26 DIAGNOSIS — Z6826 Body mass index (BMI) 26.0-26.9, adult: Secondary | ICD-10-CM | POA: Diagnosis not present

## 2024-02-26 DIAGNOSIS — I69118 Other symptoms and signs involving cognitive functions following nontraumatic intracerebral hemorrhage: Secondary | ICD-10-CM | POA: Diagnosis not present

## 2024-02-26 DIAGNOSIS — Z8616 Personal history of COVID-19: Secondary | ICD-10-CM | POA: Diagnosis not present

## 2024-02-26 DIAGNOSIS — E43 Unspecified severe protein-calorie malnutrition: Secondary | ICD-10-CM | POA: Diagnosis not present

## 2024-02-26 DIAGNOSIS — E785 Hyperlipidemia, unspecified: Secondary | ICD-10-CM | POA: Diagnosis not present

## 2024-02-26 DIAGNOSIS — I10 Essential (primary) hypertension: Secondary | ICD-10-CM | POA: Diagnosis not present

## 2024-02-26 DIAGNOSIS — G629 Polyneuropathy, unspecified: Secondary | ICD-10-CM | POA: Diagnosis not present

## 2024-03-01 ENCOUNTER — Telehealth: Payer: Self-pay

## 2024-03-01 DIAGNOSIS — E785 Hyperlipidemia, unspecified: Secondary | ICD-10-CM | POA: Diagnosis not present

## 2024-03-01 DIAGNOSIS — I10 Essential (primary) hypertension: Secondary | ICD-10-CM | POA: Diagnosis not present

## 2024-03-01 DIAGNOSIS — F320 Major depressive disorder, single episode, mild: Secondary | ICD-10-CM | POA: Diagnosis not present

## 2024-03-01 DIAGNOSIS — D649 Anemia, unspecified: Secondary | ICD-10-CM | POA: Diagnosis not present

## 2024-03-01 DIAGNOSIS — G629 Polyneuropathy, unspecified: Secondary | ICD-10-CM | POA: Diagnosis not present

## 2024-03-01 DIAGNOSIS — Z794 Long term (current) use of insulin: Secondary | ICD-10-CM | POA: Diagnosis not present

## 2024-03-01 DIAGNOSIS — Z8616 Personal history of COVID-19: Secondary | ICD-10-CM | POA: Diagnosis not present

## 2024-03-01 DIAGNOSIS — Z6826 Body mass index (BMI) 26.0-26.9, adult: Secondary | ICD-10-CM | POA: Diagnosis not present

## 2024-03-01 DIAGNOSIS — D849 Immunodeficiency, unspecified: Secondary | ICD-10-CM | POA: Diagnosis not present

## 2024-03-01 DIAGNOSIS — E119 Type 2 diabetes mellitus without complications: Secondary | ICD-10-CM | POA: Diagnosis not present

## 2024-03-01 DIAGNOSIS — I69118 Other symptoms and signs involving cognitive functions following nontraumatic intracerebral hemorrhage: Secondary | ICD-10-CM | POA: Diagnosis not present

## 2024-03-01 DIAGNOSIS — G4089 Other seizures: Secondary | ICD-10-CM | POA: Diagnosis not present

## 2024-03-01 DIAGNOSIS — Z7952 Long term (current) use of systemic steroids: Secondary | ICD-10-CM | POA: Diagnosis not present

## 2024-03-01 DIAGNOSIS — E43 Unspecified severe protein-calorie malnutrition: Secondary | ICD-10-CM | POA: Diagnosis not present

## 2024-03-01 DIAGNOSIS — Z941 Heart transplant status: Secondary | ICD-10-CM | POA: Diagnosis not present

## 2024-03-01 NOTE — Telephone Encounter (Signed)
 I spoke to Jason Stevenson/ [facility] and she confirmed the receipt of the request for [facility] and said they have already reached out to the patient.

## 2024-03-03 ENCOUNTER — Telehealth: Payer: Self-pay | Admitting: Diagnostic Neuroimaging

## 2024-03-03 ENCOUNTER — Institutional Professional Consult (permissible substitution): Admitting: Diagnostic Neuroimaging

## 2024-03-03 NOTE — Telephone Encounter (Signed)
 Pt wife called to cancel appt due to  Pt IS SICK APPT cANCELED

## 2024-03-15 DIAGNOSIS — R261 Paralytic gait: Secondary | ICD-10-CM | POA: Diagnosis not present

## 2024-03-15 DIAGNOSIS — I69198 Other sequelae of nontraumatic intracerebral hemorrhage: Secondary | ICD-10-CM | POA: Diagnosis not present

## 2024-03-19 ENCOUNTER — Telehealth: Payer: Self-pay

## 2024-03-19 DIAGNOSIS — R261 Paralytic gait: Secondary | ICD-10-CM | POA: Diagnosis not present

## 2024-03-19 NOTE — Telephone Encounter (Signed)
 Copied from CRM #[id number]. Topic: General - Other >> Mar 19, 2024  2:43 PM Willma R wrote: Reason for CRM: Ozell from [facility] is calling to report a fall with no injury that occurred last night. Patient managed to control the fall and slowly came down on his butt and his family was able to get up. Tried to navigate around his nephew on the floor but misjudged.   Ozell can be reached at [phone]

## 2024-03-23 ENCOUNTER — Encounter: Attending: Physical Medicine & Rehabilitation | Admitting: Physical Medicine & Rehabilitation

## 2024-03-23 ENCOUNTER — Encounter: Payer: Self-pay | Admitting: Physical Medicine & Rehabilitation

## 2024-03-23 VITALS — BP 121/87 | HR 94 | Ht 71.0 in | Wt 187.0 lb

## 2024-03-23 DIAGNOSIS — R200 Anesthesia of skin: Secondary | ICD-10-CM | POA: Diagnosis present

## 2024-03-23 DIAGNOSIS — R202 Paresthesia of skin: Secondary | ICD-10-CM | POA: Insufficient documentation

## 2024-03-23 NOTE — Progress Notes (Unsigned)
 EMG/NCV of the right upper extremity Indication is to assess right hand numbness History of CVA causing left spastic hemiplegia. Patient uses assistive device with right hand.

## 2024-03-25 ENCOUNTER — Encounter: Payer: Self-pay | Admitting: Physical Medicine & Rehabilitation

## 2024-03-25 ENCOUNTER — Telehealth: Payer: Self-pay | Admitting: Internal Medicine

## 2024-03-25 NOTE — Telephone Encounter (Signed)
 Copied from CRM #[id number]. Topic: Clinical - Medical Advice >> Mar 25, 2024  4:53 PM Delon HERO wrote:  Reason for CRM: Micheal calling from [facility] is calling diastolic BP is slightly out of range at rest with  no symptoms- 90-96. CB- [phone]

## 2024-03-26 ENCOUNTER — Other Ambulatory Visit: Payer: Self-pay | Admitting: Internal Medicine

## 2024-03-26 ENCOUNTER — Encounter: Payer: Self-pay | Admitting: Internal Medicine

## 2024-03-26 MED ORDER — DULOXETINE HCL 60 MG PO CPEP: 60.0000 mg | ORAL_CAPSULE | Freq: Every day | ORAL | 6 refills | Status: AC

## 2024-03-31 ENCOUNTER — Ambulatory Visit (HOSPITAL_COMMUNITY)
Admission: EM | Admit: 2024-03-31 | Discharge: 2024-03-31 | Disposition: A | Discharge status: Home / Self Care | Attending: Internal Medicine | Admitting: Internal Medicine

## 2024-03-31 ENCOUNTER — Other Ambulatory Visit: Payer: Self-pay

## 2024-03-31 ENCOUNTER — Encounter (HOSPITAL_COMMUNITY): Payer: Self-pay | Admitting: Emergency Medicine

## 2024-03-31 DIAGNOSIS — H6122 Impacted cerumen, left ear: Secondary | ICD-10-CM

## 2024-03-31 DIAGNOSIS — Z20828 Contact with and (suspected) exposure to other viral communicable diseases: Secondary | ICD-10-CM

## 2024-03-31 LAB — POC COVID19/FLU A&B COMBO
Covid Antigen, POC: NEGATIVE
Influenza A Antigen, POC: NEGATIVE
Influenza B Antigen, POC: NEGATIVE

## 2024-03-31 NOTE — ED Provider Notes (Signed)
 [facility]    CSN: [id number] Arrival date & time: 03/31/24  1607      History   Chief Complaint Chief Complaint  Patient presents with   flu exposure    HPI Jason Stevenson is a 38 y.o. male.   38 year old male who presents urgent care secondary to influenza exposure.  He reports that he has a roommate and has had a very close contact with this person who was diagnosed with influenza.  He has had a heart transplant in the past and is on rejection medication.  He has not had any cough, congestion, sore throat, fevers, chills.     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 11/28/2023   Anemia of chronic disease 11/27/2023   Serum phosphate elevated 11/27/2023   Debility 11/24/2023   Heart transplant recipient [facility]) 03/20/2023   Mild major depression 06/03/2022   Nonischemic cardiomyopathy (HCC) 08/27/2021   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   Poorly controlled type 2 diabetes mellitus with circulatory disorder (HCC) 03/19/2021   Ischemic cardiomyopathy    Syncope 12/14/2020   Chest pain 10/30/2020   History of cerebrovascular accident (CVA) with residual deficit 10/27/2020   Acute  CVA (cerebrovascular accident) (HCC) 09/21/2020   Near syncope 09/21/2020   LV (left ventricular) mural thrombus 09/13/2020   Seizure (HCC) 09/13/2020   Neurological deficit present 09/12/2020   History of COVID-19 09/12/2020   Nontraumatic cerebral hemorrhage (HCC) 09/12/2020   Acute cerebrovascular accident (CVA) (HCC) 09/12/2020   COVID-19 08/23/2020   Noncompliance with medication treatment due to intermittent use of medication 10/12/2018   Dyslipidemia 04/17/2016   Chronic systolic CHF (congestive heart failure) (HCC) 06/18/2015   Needs flu shot 12/23/2013   Diabetes mellitus (HCC) 06/17/2013   Non-ischemic cardiomyopathy (HCC) 11/06/2012   HTN (hypertension) 11/06/2012   HLD (hyperlipidemia) 11/06/2012    Past Surgical History:  Procedure Laterality Date   HEART TRANSPLANT     ICD IMPLANT N/A 08/27/2021   Procedure: ICD IMPLANT;  Surgeon: Cindie Ole DASEN, MD;  Location: [facility] INVASIVE CV LAB;  Service: Cardiovascular;  Laterality: N/A;   None     RIGHT HEART CATH  06/21/2021   [facility]   RIGHT HEART CATH N/A 05/17/2022   Procedure: RIGHT HEART CATH;  Surgeon: Rolan Ezra RAMAN, MD;  Location: [facility] INVASIVE CV LAB;  Service: Cardiovascular;  Laterality: N/A;   RIGHT/LEFT HEART CATH AND CORONARY ANGIOGRAPHY N/A 11/02/2020   Procedure: RIGHT/LEFT HEART CATH AND CORONARY ANGIOGRAPHY;  Surgeon: Rolan Ezra RAMAN, MD;  Location: [facility] INVASIVE CV LAB;  Service: Cardiovascular;  Laterality: N/A;       Home Medications    Prior to Admission medications  Medication Sig Start Date End Date Taking? Authorizing Provider  ACCU-CHEK GUIDE test strip USE TO CHECK BLOOD SUGAR THREE TIMES DAILY. 07/03/22   Vicci Barnie NOVAK, MD  Accu-Chek Softclix Lancets lancets Use 3 (three) times daily as directed to check blood sugar. May dispense any manufacturer covered by patient's insurance and fits patient's device. 12/16/23   Kirsteins, Prentice BRAVO, MD  acetaminophen  (TYLENOL ) 325 MG tablet Take 1-2  tablets (325-650 mg total) by mouth every 4 (four) hours as needed for mild pain. 08/28/21   Lesia Ozell Prentice, PA-C  atorvastatin  (LIPITOR ) 20 MG tablet Take 1 tablet (20 mg total) by mouth at bedtime. 02/12/24   Vicci Barnie NOVAK, MD  Blood Glucose Monitoring Suppl (ACCU-CHEK GUIDE ME) w/Device KIT USE TO CHECK BLOOD SUGAR THREE TIMES DAILY. 07/04/22   Vicci Barnie NOVAK, MD  Blood Glucose Monitoring Suppl (BLOOD GLUCOSE MONITOR SYSTEM) w/Device KIT Use 3 (three) times daily. May dispense any manufacturer covered by patient's insurance. 12/16/23   Carilyn Prentice BRAVO, MD  Blood Pressure Monitor DEVI Use as directed to check home blood pressure 2-3 times a week 10/12/18   Vicci Barnie NOVAK, MD  calcium  acetate (PHOSLO ) 667 MG capsule Take 2 capsules (1,334 mg total) by mouth in the morning and at bedtime. 12/15/23   Love, Sharlet RAMAN, PA-C  carvedilol  (COREG ) 25 MG tablet Take 1 tablet (25 mg total) by mouth 2 (two) times daily with a meal. 12/15/23   Love, Sharlet RAMAN, PA-C  Continuous Glucose Sensor (FREESTYLE LIBRE 2 SENSOR) MISC UAD 04/16/23   Vicci Barnie NOVAK, MD  diclofenac  Sodium (VOLTAREN ) 1 % GEL Apply 2 g topically 4 (four) times daily as needed (pain). 12/15/23   Love, Sharlet RAMAN, PA-C  DULoxetine  (CYMBALTA ) 60 MG capsule Take 1 capsule (60 mg total) by mouth daily. For chronic pain 03/26/24   Vicci Barnie NOVAK, MD  EASY COMFORT PEN NEEDLES 32G X 4 MM MISC USE TO INJECT INSULIN  FOUR TIMES A DAY 09/22/23   Vicci Barnie NOVAK, MD  ezetimibe  (ZETIA ) 10 MG tablet Take 1 tablet (10 mg total) by mouth daily. 01/07/24   Vicci Barnie NOVAK, MD  fluticasone (FLONASE) 50 MCG/ACT nasal spray Place 1 spray into both nostrils as needed for allergies. 09/19/23   [provider]  GENGRAF  25 MG capsule Take 5 capsules (125mg ) by mouth in the morning and at bedtime. 12/16/23   Love, Sharlet RAMAN, PA-C  GLUCAGON NA Place 3 sprays into the nose once as needed.    [provider]  Glucose Blood (BLOOD GLUCOSE  TEST STRIPS) STRP Use 3 (three) times daily as directed to check blood sugar. May dispense any manufacturer covered by patient's insurance and fits patient's device. 12/16/23   Kirsteins, Prentice BRAVO, MD  insulin  glargine (LANTUS ) 100 UNIT/ML Solostar Pen Inject 18 Units into the skin at bedtime. 02/13/24   Vicci Barnie NOVAK, MD  insulin  lispro (HUMALOG  KWIKPEN) 100 UNIT/ML KwikPen Inject 8 Units into the skin 3 (three) times daily. 8 am,  noon and 6 pm with meals. 02/13/24   Vicci Barnie NOVAK, MD  Insulin  Pen Needle 32G X 4 MM MISC Use as directed with insulin  pens. 12/16/23   Love, Sharlet RAMAN, PA-C  Insulin  Pen Needle 32G X 4 MM MISC 1 each by Does not apply route 3 (three) times daily. May dispense any manufacturer covered by patient's insurance. 12/16/23   Kirsteins, Prentice BRAVO, MD  Insulin  Syringe-Needle U-100 (RELION INSULIN  SYRINGE) 31G X 15/64 0.3 ML MISC Use  to inject insulin  daily. 07/13/18   Vicci Barnie NOVAK, MD  Lancet Device MISC 1 each by Does not apply route 3 (three) times daily. May dispense any manufacturer covered by patient's insurance. 12/16/23   Kirsteins, Prentice BRAVO, MD  levETIRAcetam  (KEPPRA ) 750 MG tablet Take 2 tablets (1,500 mg total) by mouth 2 (two) times daily. 01/07/24   Vicci Barnie NOVAK, MD  lidocaine  (LIDODERM ) 5 % Place 2 patches onto the skin daily. Remove & Discard patch within 12 hours or as directed by MD 12/16/23   Love, Sharlet RAMAN, PA-C  losartan  (COZAAR ) 100 MG tablet Take 1 tablet (100 mg total) by mouth daily. 01/07/24   Vicci Barnie NOVAK, MD  magnesium  oxide (MAG-OX) 400 (240 Mg) MG tablet Take 2 tablets (800 mg total) by mouth 2 (two) times daily. 12/15/23   Love, Sharlet RAMAN, PA-C  melatonin 3 MG TABS tablet Take 1 tablet (3 mg total) by mouth at bedtime. 12/15/23   Love, Sharlet RAMAN, PA-C  Oxcarbazepine  (TRILEPTAL ) 300 MG tablet Take 1 tablet by mouth every morning and take 2 tablets by mouth before bedtime. 01/07/24   Vicci Barnie NOVAK, MD  pantoprazole  (PROTONIX ) 40 MG tablet Take  1 tablet (40 mg total) by mouth daily. 12/16/23   Love, Sharlet RAMAN, PA-C  predniSONE  (DELTASONE ) 5 MG tablet Take 1 tablet (5 mg total) by mouth daily. 12/16/23   Love, Sharlet RAMAN, PA-C  pregabalin  (LYRICA ) 75 MG capsule Take 1 capsule (75 mg total) by mouth at bedtime. 01/26/24   Vicci Barnie NOVAK, MD  senna-docusate (SENOKOT-S) 8.6-50 MG tablet Take 1-2 tablets by mouth 2 (two) times daily as needed for mild constipation. 12/03/22   [provider]  sirolimus (RAPAMUNE) 1 MG tablet Take 1 mg by mouth daily.    [provider]  traZODone  (DESYREL ) 50 MG tablet Take 1 tablet (50 mg total) by mouth at bedtime as needed for sleep. Patient not taking: Reported on 02/12/2024 01/07/24   Vicci Barnie NOVAK, MD  valGANciclovir  (VALCYTE ) 450 MG tablet Take 2 tablets (900 mg total) by mouth daily with breakfast. 12/15/23   Love, Sharlet RAMAN, PA-C    Family History Family History  Problem Relation Age of Onset   Stroke Mother    Diabetes Mother    Hypertension Mother    Stroke Maternal Aunt    Heart attack Neg Hx     Social History Social History[1]   Allergies   Patient has no known allergies.   Review of Systems Review of Systems  Constitutional:  Negative for chills and fever.  HENT:  Negative for ear pain and sore throat.   Eyes:  Negative for pain and visual disturbance.  Respiratory:  Negative for cough and shortness of breath.   Cardiovascular:  Negative for chest pain and palpitations.  Gastrointestinal:  Negative for abdominal pain and vomiting.  Genitourinary:  Negative for dysuria and hematuria.  Musculoskeletal:  Negative for arthralgias and back pain.  Skin:  Negative for color change and rash.  Neurological:  Negative for seizures and syncope.  All other systems reviewed and are negative.    Physical Exam Triage Vital Signs ED Triage Vitals  Encounter Vitals Group     BP 03/31/24 1706 134/87     Girls Systolic BP Percentile --      Girls Diastolic BP  Percentile --      Boys Systolic BP Percentile --      Boys Diastolic BP Percentile --  Pulse Rate 03/31/24 1706 90     Resp 03/31/24 1706 16     Temp 03/31/24 1706 98.1 F (36.7 C)     Temp Source 03/31/24 1706 Oral     SpO2 03/31/24 1706 98 %     Weight --      Height --      Head Circumference --      Peak Flow --      Pain Score 03/31/24 1702 0     Pain Loc --      Pain Education --      Exclude from Growth Chart --    No data found.  Updated Vital Signs BP 134/87 (BP Location: Right Arm) Comment (BP Location): large cuff  Pulse 90   Temp 98.1 F (36.7 C) (Oral)   Resp 16   SpO2 98%   Visual Acuity Right Eye Distance:   Left Eye Distance:   Bilateral Distance:    Right Eye Near:   Left Eye Near:    Bilateral Near:     Physical Exam Vitals and nursing note reviewed.  Constitutional:      General: He is not in acute distress.    Appearance: He is well-developed.  HENT:     Head: Normocephalic and atraumatic.     Right Ear: Tympanic membrane normal.     Left Ear: There is impacted cerumen.     Ears:     Comments: Tympanic membrane is clear on the left after cerumen removal Eyes:     Conjunctiva/sclera: Conjunctivae normal.  Cardiovascular:     Rate and Rhythm: Normal rate and regular rhythm.     Heart sounds: No murmur heard. Pulmonary:     Effort: Pulmonary effort is normal. No respiratory distress.     Breath sounds: Normal breath sounds.  Abdominal:     Palpations: Abdomen is soft.     Tenderness: There is no abdominal tenderness.  Musculoskeletal:        General: No swelling.     Cervical back: Neck supple.  Skin:    General: Skin is warm and dry.     Capillary Refill: Capillary refill takes less than 2 seconds.  Neurological:     Mental Status: He is alert.  Psychiatric:        Mood and Affect: Mood normal.      UC Treatments / Results  Labs (all labs ordered are listed, but only abnormal results are displayed) Labs Reviewed  POC  COVID19/FLU A&B COMBO    EKG   Radiology No results found.  Procedures Procedures (including critical care time)  Medications Ordered in UC Medications - No data to display  Initial Impression / Assessment and Plan / UC Course  I have reviewed the triage vital signs and the nursing notes.  Pertinent labs & imaging results that were available during my care of the patient were reviewed by me and considered in my medical decision making (see chart for details).     Exposure to influenza - Plan: POC Covid19/Flu A&B Antigen, POC Covid19/Flu A&B Antigen  Impacted cerumen of left ear   Flu A, flu B and COVID testing done today was negative.  Due to the known flu exposure and risk of complications from contracting influenza we will start Tamiflu once daily for 10 days.  On physical exam we did find a significant amount of earwax and the left ear.  Irrigation done today.  The tympanic membrane is clear after this.  Return  to urgent care as needed  Final Clinical Impressions(s) / UC Diagnoses   Final diagnoses:  Exposure to influenza  Impacted cerumen of left ear     Discharge Instructions      Flu A, flu B and COVID testing done today was negative.  Due to the known flu exposure and risk of complications from contracting influenza we will start Tamiflu once daily for 10 days.  On physical exam we did find a significant amount of earwax and the left ear.  Irrigation done today.  The tympanic membrane is clear after this.  Return to urgent care as needed     ED Prescriptions   None    PDMP not reviewed this encounter.    [1]  Social History Tobacco Use   Smoking status: Never   Smokeless tobacco: Never  Vaping Use   Vaping status: Never Used  Substance Use Topics   Alcohol use: Not Currently    Comment: occasional when hanging out with the wrong people No recent use.   Drug use: Not Currently    Types: Marijuana    Comment: occasional, last 2013      Teresa Almarie LABOR, [place] 03/31/24 1811

## 2024-03-31 NOTE — ED Triage Notes (Signed)
 Patient reports a exposure to flu yesterday. This person is a room mate Patient has not had any symptoms.

## 2024-03-31 NOTE — Discharge Instructions (Addendum)
 Flu A, flu B and COVID testing done today was negative.  Due to the known flu exposure and risk of complications from contracting influenza we will start Tamiflu once daily for 10 days.  On physical exam we did find a significant amount of earwax and the left ear.  Irrigation done today.  The tympanic membrane is clear after this.  Return to urgent care as needed

## 2024-04-14 ENCOUNTER — Telehealth: Payer: Self-pay | Admitting: Internal Medicine

## 2024-04-14 NOTE — Telephone Encounter (Signed)
 Copied from CRM #[id number]. Topic: Clinical - Home Health Verbal Orders >> Apr 14, 2024  9:55 AM Terri MATSU wrote:  Caller/Agency: KATE WHITE FROM [facility] Callback Number: [phone] Service Requested: Physical Therapy Frequency: CONTINUED PT FOR ONCE A WEEK FOR 8WEEKS. Any new concerns about the patient? No

## 2024-04-16 NOTE — Telephone Encounter (Signed)
 Order given to  [facility] FROM [facility] Callback Number: [phone] Service Requested: Physical Therapy Frequency: CONTINUED PT FOR ONCE A WEEK FOR 8WEEKS.

## 2024-04-20 ENCOUNTER — Encounter: Payer: Self-pay | Admitting: Internal Medicine

## 2024-04-21 ENCOUNTER — Other Ambulatory Visit: Payer: Self-pay | Admitting: Internal Medicine

## 2024-04-21 DIAGNOSIS — E1142 Type 2 diabetes mellitus with diabetic polyneuropathy: Secondary | ICD-10-CM

## 2024-04-23 ENCOUNTER — Telehealth: Payer: Self-pay | Admitting: Diagnostic Neuroimaging

## 2024-04-23 ENCOUNTER — Institutional Professional Consult (permissible substitution): Admitting: Diagnostic Neuroimaging

## 2024-04-23 NOTE — Telephone Encounter (Signed)
 Consult had to be cx due to conflict

## 2024-05-14 ENCOUNTER — Telehealth: Payer: Self-pay

## 2024-05-14 NOTE — Telephone Encounter (Unsigned)
 Copied from CRM [id number]. Topic: General - Other >> May 14, 2024  8:43 AM Jeoffrey H wrote: Reason for CRM: Garrel with [facility] calling to report the patient missed his PT appt due to the weather and having conflict with other schd appts.    Ozell- [phone]

## 2024-05-20 ENCOUNTER — Ambulatory Visit: Admitting: Podiatry

## 2024-05-27 ENCOUNTER — Ambulatory Visit: Admitting: Podiatry

## 2024-06-14 ENCOUNTER — Ambulatory Visit: Admitting: Internal Medicine

## 2024-06-15 ENCOUNTER — Encounter: Admitting: Physical Medicine & Rehabilitation

## 2024-09-02 ENCOUNTER — Institutional Professional Consult (permissible substitution): Admitting: Diagnostic Neuroimaging
